# Patient Record
Sex: Female | Born: 1960 | Race: Black or African American | Hispanic: No | Smoking: Never smoker
Health system: Southern US, Community
[De-identification: ages and names within clinical notes are randomized; demographics above are authoritative.]

## PROBLEM LIST (undated history)

## (undated) DIAGNOSIS — M549 Dorsalgia, unspecified: Secondary | ICD-10-CM

## (undated) DIAGNOSIS — I679 Cerebrovascular disease, unspecified: Secondary | ICD-10-CM

## (undated) DIAGNOSIS — K219 Gastro-esophageal reflux disease without esophagitis: Secondary | ICD-10-CM

## (undated) DIAGNOSIS — R269 Unspecified abnormalities of gait and mobility: Secondary | ICD-10-CM

## (undated) DIAGNOSIS — E119 Type 2 diabetes mellitus without complications: Secondary | ICD-10-CM

## (undated) DIAGNOSIS — E785 Hyperlipidemia, unspecified: Secondary | ICD-10-CM

## (undated) DIAGNOSIS — I639 Cerebral infarction, unspecified: Secondary | ICD-10-CM

## (undated) DIAGNOSIS — I6509 Occlusion and stenosis of unspecified vertebral artery: Secondary | ICD-10-CM

## (undated) DIAGNOSIS — I10 Essential (primary) hypertension: Secondary | ICD-10-CM

## (undated) DIAGNOSIS — H532 Diplopia: Secondary | ICD-10-CM

## (undated) DIAGNOSIS — G610 Guillain-Barre syndrome: Secondary | ICD-10-CM

## (undated) DIAGNOSIS — L2089 Other atopic dermatitis: Secondary | ICD-10-CM

## (undated) HISTORY — DX: Essential (primary) hypertension: I10

## (undated) HISTORY — DX: Unspecified abnormalities of gait and mobility: R26.9

## (undated) HISTORY — PX: ABDOMINAL HYSTERECTOMY: SHX81

## (undated) HISTORY — DX: Other atopic dermatitis: L20.89

## (undated) HISTORY — DX: Hyperlipidemia, unspecified: E78.5

## (undated) HISTORY — DX: Cerebrovascular disease, unspecified: I67.9

## (undated) HISTORY — PX: FOOT SURGERY: SHX648

## (undated) HISTORY — DX: Occlusion and stenosis of unspecified vertebral artery: I65.09

## (undated) HISTORY — PX: DILATION AND CURETTAGE OF UTERUS: SHX78

## (undated) HISTORY — DX: Type 2 diabetes mellitus without complications: E11.9

## (undated) HISTORY — DX: Dorsalgia, unspecified: M54.9

## (undated) HISTORY — DX: Diplopia: H53.2

---

## 1986-05-02 DIAGNOSIS — G610 Guillain-Barre syndrome: Secondary | ICD-10-CM

## 1986-05-02 HISTORY — DX: Guillain-Barre syndrome: G61.0

## 1998-05-15 ENCOUNTER — Other Ambulatory Visit: Admission: RE | Admit: 1998-05-15 | Discharge: 1998-05-15 | Payer: Self-pay | Admitting: Obstetrics & Gynecology

## 1998-05-22 ENCOUNTER — Encounter: Payer: Self-pay | Admitting: Obstetrics and Gynecology

## 1998-05-22 ENCOUNTER — Ambulatory Visit (HOSPITAL_COMMUNITY): Admission: RE | Admit: 1998-05-22 | Discharge: 1998-05-22 | Payer: Self-pay | Admitting: *Deleted

## 1998-06-25 ENCOUNTER — Observation Stay (HOSPITAL_COMMUNITY): Admission: AD | Admit: 1998-06-25 | Discharge: 1998-06-26 | Payer: Self-pay | Admitting: Obstetrics and Gynecology

## 1998-07-09 ENCOUNTER — Inpatient Hospital Stay (HOSPITAL_COMMUNITY): Admission: AD | Admit: 1998-07-09 | Discharge: 1998-07-09 | Payer: Self-pay | Admitting: Obstetrics & Gynecology

## 1998-08-07 ENCOUNTER — Inpatient Hospital Stay (HOSPITAL_COMMUNITY): Admission: AD | Admit: 1998-08-07 | Discharge: 1998-08-07 | Payer: Self-pay | Admitting: Obstetrics & Gynecology

## 1998-09-04 ENCOUNTER — Inpatient Hospital Stay (HOSPITAL_COMMUNITY): Admission: AD | Admit: 1998-09-04 | Discharge: 1998-09-04 | Payer: Self-pay | Admitting: Obstetrics & Gynecology

## 1998-10-05 ENCOUNTER — Inpatient Hospital Stay (HOSPITAL_COMMUNITY): Admission: AD | Admit: 1998-10-05 | Discharge: 1998-10-05 | Payer: Self-pay | Admitting: Obstetrics & Gynecology

## 1998-11-09 ENCOUNTER — Inpatient Hospital Stay (HOSPITAL_COMMUNITY): Admission: AD | Admit: 1998-11-09 | Discharge: 1998-11-09 | Payer: Self-pay | Admitting: Obstetrics & Gynecology

## 1998-12-04 ENCOUNTER — Inpatient Hospital Stay (HOSPITAL_COMMUNITY): Admission: AD | Admit: 1998-12-04 | Discharge: 1998-12-04 | Payer: Self-pay | Admitting: Obstetrics & Gynecology

## 2000-08-25 ENCOUNTER — Other Ambulatory Visit: Admission: RE | Admit: 2000-08-25 | Discharge: 2000-08-25 | Payer: Self-pay | Admitting: Internal Medicine

## 2002-04-05 ENCOUNTER — Ambulatory Visit (HOSPITAL_COMMUNITY): Admission: RE | Admit: 2002-04-05 | Discharge: 2002-04-05 | Payer: Self-pay | Admitting: Internal Medicine

## 2002-04-05 ENCOUNTER — Encounter: Payer: Self-pay | Admitting: Internal Medicine

## 2003-05-07 ENCOUNTER — Ambulatory Visit (HOSPITAL_COMMUNITY): Admission: RE | Admit: 2003-05-07 | Discharge: 2003-05-07 | Payer: Self-pay | Admitting: Internal Medicine

## 2004-07-23 ENCOUNTER — Ambulatory Visit (HOSPITAL_COMMUNITY): Admission: RE | Admit: 2004-07-23 | Discharge: 2004-07-23 | Payer: Self-pay | Admitting: Internal Medicine

## 2005-12-24 ENCOUNTER — Emergency Department (HOSPITAL_COMMUNITY): Admission: EM | Admit: 2005-12-24 | Discharge: 2005-12-24 | Payer: Self-pay | Admitting: Emergency Medicine

## 2006-04-05 ENCOUNTER — Ambulatory Visit (HOSPITAL_COMMUNITY): Admission: RE | Admit: 2006-04-05 | Discharge: 2006-04-05 | Payer: Self-pay | Admitting: Internal Medicine

## 2006-04-28 ENCOUNTER — Ambulatory Visit: Payer: Self-pay | Admitting: Internal Medicine

## 2006-04-28 LAB — CONVERTED CEMR LAB
ALT: 17 units/L (ref 0–40)
AST: 22 units/L (ref 0–37)
Albumin: 3.3 g/dL — ABNORMAL LOW (ref 3.5–5.2)
Alkaline Phosphatase: 60 units/L (ref 39–117)
BUN: 10 mg/dL (ref 6–23)
Basophils Absolute: 0.1 10*3/uL (ref 0.0–0.1)
Basophils Relative: 1.6 % — ABNORMAL HIGH (ref 0.0–1.0)
CO2: 31 meq/L (ref 19–32)
Calcium: 9 mg/dL (ref 8.4–10.5)
Chloride: 105 meq/L (ref 96–112)
Chol/HDL Ratio, serum: 5.8
Cholesterol: 221 mg/dL (ref 0–200)
Creatinine, Ser: 0.7 mg/dL (ref 0.4–1.2)
Eosinophil percent: 3 % (ref 0.0–5.0)
GFR calc non Af Amer: 96 mL/min
Glomerular Filtration Rate, Af Am: 116 mL/min/{1.73_m2}
Glucose, Bld: 129 mg/dL — ABNORMAL HIGH (ref 70–99)
HCT: 37 % (ref 36.0–46.0)
HDL: 38.2 mg/dL — ABNORMAL LOW (ref >39.0)
Hemoglobin: 12.5 g/dL (ref 12.0–15.0)
Hgb A1c MFr Bld: 6.6 % — ABNORMAL HIGH (ref 4.6–6.0)
LDL DIRECT: 182.9 mg/dL
Lymphocytes Relative: 31.2 % (ref 12.0–46.0)
MCHC: 33.8 g/dL (ref 30.0–36.0)
MCV: 76.8 fL — ABNORMAL LOW (ref 78.0–100.0)
Monocytes Absolute: 0.8 10*3/uL — ABNORMAL HIGH (ref 0.2–0.7)
Monocytes Relative: 15.4 % — ABNORMAL HIGH (ref 3.0–11.0)
Neutro Abs: 2.5 10*3/uL (ref 1.4–7.7)
Neutrophils Relative %: 48.8 % (ref 43.0–77.0)
Platelets: 284 10*3/uL (ref 150–400)
Potassium: 4.7 meq/L (ref 3.5–5.1)
RBC: 4.81 M/uL (ref 3.87–5.11)
RDW: 14.7 % — ABNORMAL HIGH (ref 11.5–14.6)
Sodium: 141 meq/L (ref 135–145)
TSH: 0.89 microintl units/mL (ref 0.35–5.50)
Total Bilirubin: 0.6 mg/dL (ref 0.3–1.2)
Total Protein: 6.9 g/dL (ref 6.0–8.3)
Triglyceride fasting, serum: 72 mg/dL (ref 0–149)
VLDL: 14 mg/dL (ref 0–40)
WBC: 5.3 10*3/uL (ref 4.5–10.5)

## 2006-05-05 ENCOUNTER — Ambulatory Visit: Payer: Self-pay | Admitting: Internal Medicine

## 2006-07-10 ENCOUNTER — Telehealth: Payer: Self-pay | Admitting: Licensed Clinical Social Worker

## 2006-07-31 ENCOUNTER — Ambulatory Visit: Payer: Self-pay | Admitting: Internal Medicine

## 2006-08-02 ENCOUNTER — Inpatient Hospital Stay (HOSPITAL_COMMUNITY): Admission: RE | Admit: 2006-08-02 | Discharge: 2006-08-04 | Payer: Self-pay | Admitting: Obstetrics and Gynecology

## 2006-08-02 ENCOUNTER — Encounter (INDEPENDENT_AMBULATORY_CARE_PROVIDER_SITE_OTHER): Payer: Self-pay | Admitting: Specialist

## 2006-09-05 ENCOUNTER — Encounter: Admission: RE | Admit: 2006-09-05 | Discharge: 2006-09-05 | Payer: Self-pay | Admitting: Obstetrics and Gynecology

## 2008-05-02 DIAGNOSIS — I639 Cerebral infarction, unspecified: Secondary | ICD-10-CM

## 2008-05-02 HISTORY — DX: Cerebral infarction, unspecified: I63.9

## 2008-07-14 ENCOUNTER — Emergency Department (HOSPITAL_COMMUNITY): Admission: EM | Admit: 2008-07-14 | Discharge: 2008-07-15 | Payer: Self-pay | Admitting: Emergency Medicine

## 2008-07-15 ENCOUNTER — Ambulatory Visit: Payer: Self-pay | Admitting: Internal Medicine

## 2008-07-15 ENCOUNTER — Inpatient Hospital Stay (HOSPITAL_COMMUNITY): Admission: AD | Admit: 2008-07-15 | Discharge: 2008-07-21 | Payer: Self-pay | Admitting: Internal Medicine

## 2008-07-15 DIAGNOSIS — H532 Diplopia: Secondary | ICD-10-CM

## 2008-07-15 DIAGNOSIS — I10 Essential (primary) hypertension: Secondary | ICD-10-CM

## 2008-07-15 DIAGNOSIS — E119 Type 2 diabetes mellitus without complications: Secondary | ICD-10-CM

## 2008-07-15 HISTORY — DX: Essential (primary) hypertension: I10

## 2008-07-15 HISTORY — DX: Diplopia: H53.2

## 2008-07-15 HISTORY — DX: Type 2 diabetes mellitus without complications: E11.9

## 2008-07-15 LAB — CONVERTED CEMR LAB: Blood Glucose, Fingerstick: 244

## 2008-07-16 ENCOUNTER — Encounter: Payer: Self-pay | Admitting: Internal Medicine

## 2008-07-17 ENCOUNTER — Encounter: Payer: Self-pay | Admitting: Internal Medicine

## 2008-07-17 ENCOUNTER — Ambulatory Visit: Payer: Self-pay | Admitting: Physical Medicine & Rehabilitation

## 2008-07-21 ENCOUNTER — Inpatient Hospital Stay (HOSPITAL_COMMUNITY): Admission: RE | Admit: 2008-07-21 | Discharge: 2008-07-25 | Payer: Self-pay | Admitting: Internal Medicine

## 2008-07-23 ENCOUNTER — Ambulatory Visit: Payer: Self-pay | Admitting: Physical Medicine & Rehabilitation

## 2008-07-29 ENCOUNTER — Encounter: Admission: RE | Admit: 2008-07-29 | Discharge: 2008-07-29 | Payer: Self-pay | Admitting: Internal Medicine

## 2008-07-29 ENCOUNTER — Encounter
Admission: RE | Admit: 2008-07-29 | Discharge: 2008-10-27 | Payer: Self-pay | Admitting: Physical Medicine & Rehabilitation

## 2008-07-29 ENCOUNTER — Encounter: Payer: Self-pay | Admitting: Internal Medicine

## 2008-08-05 ENCOUNTER — Ambulatory Visit: Payer: Self-pay | Admitting: Internal Medicine

## 2008-08-05 DIAGNOSIS — I69354 Hemiplegia and hemiparesis following cerebral infarction affecting left non-dominant side: Secondary | ICD-10-CM | POA: Insufficient documentation

## 2008-08-05 DIAGNOSIS — I679 Cerebrovascular disease, unspecified: Secondary | ICD-10-CM

## 2008-08-05 HISTORY — DX: Cerebrovascular disease, unspecified: I67.9

## 2008-08-06 ENCOUNTER — Telehealth: Payer: Self-pay | Admitting: Internal Medicine

## 2008-08-12 ENCOUNTER — Encounter: Payer: Self-pay | Admitting: Internal Medicine

## 2008-08-22 ENCOUNTER — Encounter
Admission: RE | Admit: 2008-08-22 | Discharge: 2008-11-20 | Payer: Self-pay | Admitting: Physical Medicine & Rehabilitation

## 2008-08-25 ENCOUNTER — Telehealth: Payer: Self-pay | Admitting: Internal Medicine

## 2008-08-26 ENCOUNTER — Ambulatory Visit: Payer: Self-pay | Admitting: Physical Medicine & Rehabilitation

## 2008-09-02 ENCOUNTER — Ambulatory Visit: Payer: Self-pay | Admitting: Internal Medicine

## 2008-09-15 ENCOUNTER — Telehealth: Payer: Self-pay | Admitting: Internal Medicine

## 2008-10-06 ENCOUNTER — Ambulatory Visit (HOSPITAL_COMMUNITY): Admission: RE | Admit: 2008-10-06 | Discharge: 2008-10-06 | Payer: Self-pay | Admitting: Internal Medicine

## 2008-10-21 ENCOUNTER — Ambulatory Visit: Payer: Self-pay | Admitting: Physical Medicine & Rehabilitation

## 2008-10-23 ENCOUNTER — Telehealth: Payer: Self-pay | Admitting: Internal Medicine

## 2008-11-05 ENCOUNTER — Ambulatory Visit: Payer: Self-pay | Admitting: Internal Medicine

## 2008-11-05 LAB — CONVERTED CEMR LAB: Blood Glucose, Fingerstick: 226

## 2008-11-14 ENCOUNTER — Ambulatory Visit: Payer: Self-pay | Admitting: Family Medicine

## 2008-11-14 DIAGNOSIS — M549 Dorsalgia, unspecified: Secondary | ICD-10-CM | POA: Insufficient documentation

## 2008-11-14 HISTORY — DX: Dorsalgia, unspecified: M54.9

## 2008-12-08 ENCOUNTER — Telehealth (INDEPENDENT_AMBULATORY_CARE_PROVIDER_SITE_OTHER): Payer: Self-pay | Admitting: *Deleted

## 2008-12-15 ENCOUNTER — Encounter
Admission: RE | Admit: 2008-12-15 | Discharge: 2008-12-15 | Payer: Self-pay | Admitting: Physical Medicine & Rehabilitation

## 2008-12-19 ENCOUNTER — Ambulatory Visit: Payer: Self-pay | Admitting: Internal Medicine

## 2008-12-19 LAB — CONVERTED CEMR LAB: Hgb A1c MFr Bld: 6.4 % — ABNORMAL HIGH (ref 4.6–6.1)

## 2009-02-27 ENCOUNTER — Ambulatory Visit: Payer: Self-pay | Admitting: Internal Medicine

## 2009-02-27 LAB — CONVERTED CEMR LAB
ALT: 20 units/L (ref 0–35)
AST: 19 units/L (ref 0–37)
Albumin: 3.7 g/dL (ref 3.5–5.2)
Alkaline Phosphatase: 64 units/L (ref 39–117)
BUN: 11 mg/dL (ref 6–23)
Basophils Absolute: 0 10*3/uL (ref 0.0–0.1)
Basophils Relative: 0.7 % (ref 0.0–3.0)
Bilirubin, Direct: 0 mg/dL (ref 0.0–0.3)
CO2: 29 meq/L (ref 19–32)
Calcium: 8.9 mg/dL (ref 8.4–10.5)
Chloride: 105 meq/L (ref 96–112)
Cholesterol: 239 mg/dL — ABNORMAL HIGH (ref 0–200)
Creatinine, Ser: 0.7 mg/dL (ref 0.4–1.2)
Creatinine,U: 176.9 mg/dL
Direct LDL: 197.6 mg/dL
Eosinophils Absolute: 0.1 10*3/uL (ref 0.0–0.7)
Eosinophils Relative: 2.5 % (ref 0.0–5.0)
GFR calc non Af Amer: 114.01 mL/min (ref >60)
Glucose, Bld: 126 mg/dL — ABNORMAL HIGH (ref 70–99)
HCT: 38.5 % (ref 36.0–46.0)
HDL: 38.4 mg/dL — ABNORMAL LOW (ref >39.00)
Hemoglobin: 13 g/dL (ref 12.0–15.0)
Hgb A1c MFr Bld: 6.8 % — ABNORMAL HIGH (ref 4.6–6.5)
Lymphocytes Relative: 40.3 % (ref 12.0–46.0)
Lymphs Abs: 2.2 10*3/uL (ref 0.7–4.0)
MCHC: 33.9 g/dL (ref 30.0–36.0)
MCV: 83.6 fL (ref 78.0–100.0)
Microalb Creat Ratio: 8.5 mg/g (ref 0.0–30.0)
Microalb, Ur: 1.5 mg/dL (ref 0.0–1.9)
Monocytes Absolute: 0.6 10*3/uL (ref 0.1–1.0)
Monocytes Relative: 10.8 % (ref 3.0–12.0)
Neutro Abs: 2.5 10*3/uL (ref 1.4–7.7)
Neutrophils Relative %: 45.7 % (ref 43.0–77.0)
Platelets: 226 10*3/uL (ref 150.0–400.0)
Potassium: 3.5 meq/L (ref 3.5–5.1)
RBC: 4.6 M/uL (ref 3.87–5.11)
RDW: 13.4 % (ref 11.5–14.6)
Sodium: 143 meq/L (ref 135–145)
TSH: 0.62 microintl units/mL (ref 0.35–5.50)
Total Bilirubin: 0.6 mg/dL (ref 0.3–1.2)
Total CHOL/HDL Ratio: 6
Total Protein: 8 g/dL (ref 6.0–8.3)
Triglycerides: 66 mg/dL (ref 0.0–149.0)
VLDL: 13.2 mg/dL (ref 0.0–40.0)
WBC: 5.4 10*3/uL (ref 4.5–10.5)

## 2009-03-06 ENCOUNTER — Telehealth: Payer: Self-pay | Admitting: Internal Medicine

## 2009-03-06 ENCOUNTER — Ambulatory Visit: Payer: Self-pay | Admitting: Internal Medicine

## 2009-03-06 DIAGNOSIS — E785 Hyperlipidemia, unspecified: Secondary | ICD-10-CM

## 2009-03-06 DIAGNOSIS — E1169 Type 2 diabetes mellitus with other specified complication: Secondary | ICD-10-CM

## 2009-03-06 HISTORY — DX: Hyperlipidemia, unspecified: E78.5

## 2009-03-13 ENCOUNTER — Telehealth (INDEPENDENT_AMBULATORY_CARE_PROVIDER_SITE_OTHER): Payer: Self-pay | Admitting: *Deleted

## 2009-03-20 ENCOUNTER — Telehealth: Payer: Self-pay | Admitting: Internal Medicine

## 2009-04-03 ENCOUNTER — Ambulatory Visit: Payer: Self-pay | Admitting: Internal Medicine

## 2009-04-03 DIAGNOSIS — L2089 Other atopic dermatitis: Secondary | ICD-10-CM

## 2009-04-03 HISTORY — DX: Other atopic dermatitis: L20.89

## 2009-04-03 LAB — CONVERTED CEMR LAB: Blood Glucose, Fingerstick: 183

## 2009-12-02 ENCOUNTER — Encounter (INDEPENDENT_AMBULATORY_CARE_PROVIDER_SITE_OTHER): Payer: Self-pay | Admitting: *Deleted

## 2010-01-27 ENCOUNTER — Encounter (INDEPENDENT_AMBULATORY_CARE_PROVIDER_SITE_OTHER): Payer: Self-pay | Admitting: *Deleted

## 2010-01-29 ENCOUNTER — Ambulatory Visit (HOSPITAL_COMMUNITY): Admission: RE | Admit: 2010-01-29 | Discharge: 2010-01-29 | Payer: Self-pay | Admitting: Internal Medicine

## 2010-02-01 ENCOUNTER — Encounter (INDEPENDENT_AMBULATORY_CARE_PROVIDER_SITE_OTHER): Payer: Self-pay | Admitting: *Deleted

## 2010-02-01 ENCOUNTER — Ambulatory Visit: Payer: Self-pay | Admitting: Gastroenterology

## 2010-02-01 ENCOUNTER — Telehealth: Payer: Self-pay | Admitting: Internal Medicine

## 2010-03-05 ENCOUNTER — Ambulatory Visit: Payer: Self-pay | Admitting: Gastroenterology

## 2010-03-09 ENCOUNTER — Telehealth: Payer: Self-pay | Admitting: Internal Medicine

## 2010-03-12 ENCOUNTER — Telehealth: Payer: Self-pay

## 2010-03-12 ENCOUNTER — Ambulatory Visit: Payer: Self-pay | Admitting: Internal Medicine

## 2010-03-12 LAB — CONVERTED CEMR LAB
ALT: 23 units/L (ref 0–35)
Alkaline Phosphatase: 79 units/L (ref 39–117)
Basophils Relative: 0.4 % (ref 0.0–3.0)
Bilirubin Urine: NEGATIVE
Bilirubin, Direct: 0.1 mg/dL (ref 0.0–0.3)
Blood in Urine, dipstick: NEGATIVE
Chloride: 101 meq/L (ref 96–112)
Cholesterol: 276 mg/dL — ABNORMAL HIGH (ref 0–200)
Creatinine, Ser: 0.7 mg/dL (ref 0.4–1.2)
Direct LDL: 218.7 mg/dL
Eosinophils Relative: 2.3 % (ref 0.0–5.0)
Hemoglobin: 13.5 g/dL (ref 12.0–15.0)
Hgb A1c MFr Bld: 9.9 % — ABNORMAL HIGH (ref 4.6–6.5)
Ketones, urine, test strip: NEGATIVE
MCV: 79.6 fL (ref 78.0–100.0)
Monocytes Absolute: 0.6 10*3/uL (ref 0.1–1.0)
Neutrophils Relative %: 45.8 % (ref 43.0–77.0)
Nitrite: NEGATIVE
Potassium: 4.8 meq/L (ref 3.5–5.1)
Protein, U semiquant: NEGATIVE
RBC: 5.03 M/uL (ref 3.87–5.11)
Sodium: 137 meq/L (ref 135–145)
Specific Gravity, Urine: 1.02
Total CHOL/HDL Ratio: 6
Total Protein: 7.3 g/dL (ref 6.0–8.3)
Urobilinogen, UA: 0.2
VLDL: 22.6 mg/dL (ref 0.0–40.0)
WBC Urine, dipstick: NEGATIVE
WBC: 5 10*3/uL (ref 4.5–10.5)
pH: 6.5

## 2010-03-15 ENCOUNTER — Encounter: Payer: Self-pay | Admitting: Internal Medicine

## 2010-03-16 ENCOUNTER — Telehealth: Payer: Self-pay | Admitting: Internal Medicine

## 2010-03-19 ENCOUNTER — Encounter: Payer: Self-pay | Admitting: Internal Medicine

## 2010-03-19 ENCOUNTER — Ambulatory Visit: Payer: Self-pay | Admitting: Internal Medicine

## 2010-03-19 LAB — CONVERTED CEMR LAB: Blood Glucose, Fingerstick: 366

## 2010-05-01 ENCOUNTER — Emergency Department (HOSPITAL_COMMUNITY)
Admission: EM | Admit: 2010-05-01 | Discharge: 2010-05-01 | Payer: Self-pay | Source: Home / Self Care | Admitting: Emergency Medicine

## 2010-05-07 ENCOUNTER — Ambulatory Visit
Admission: RE | Admit: 2010-05-07 | Discharge: 2010-05-07 | Payer: Self-pay | Source: Home / Self Care | Attending: Internal Medicine | Admitting: Internal Medicine

## 2010-05-07 ENCOUNTER — Other Ambulatory Visit: Payer: Self-pay | Admitting: Internal Medicine

## 2010-05-07 LAB — HEMOGLOBIN A1C: Hgb A1c MFr Bld: 9.3 % — ABNORMAL HIGH (ref 4.6–6.5)

## 2010-05-10 ENCOUNTER — Inpatient Hospital Stay (HOSPITAL_COMMUNITY)
Admission: AD | Admit: 2010-05-10 | Discharge: 2010-05-12 | Payer: Self-pay | Source: Home / Self Care | Attending: Internal Medicine | Admitting: Internal Medicine

## 2010-05-10 ENCOUNTER — Encounter (INDEPENDENT_AMBULATORY_CARE_PROVIDER_SITE_OTHER): Payer: Self-pay | Admitting: Internal Medicine

## 2010-05-10 ENCOUNTER — Ambulatory Visit
Admission: RE | Admit: 2010-05-10 | Discharge: 2010-05-10 | Payer: Self-pay | Source: Home / Self Care | Attending: Internal Medicine | Admitting: Internal Medicine

## 2010-05-10 DIAGNOSIS — R269 Unspecified abnormalities of gait and mobility: Secondary | ICD-10-CM | POA: Insufficient documentation

## 2010-05-10 HISTORY — DX: Unspecified abnormalities of gait and mobility: R26.9

## 2010-05-10 LAB — CONVERTED CEMR LAB: Blood Glucose, Fingerstick: 159

## 2010-05-11 ENCOUNTER — Encounter: Payer: Self-pay | Admitting: Internal Medicine

## 2010-05-11 ENCOUNTER — Encounter (INDEPENDENT_AMBULATORY_CARE_PROVIDER_SITE_OTHER): Payer: Self-pay | Admitting: Internal Medicine

## 2010-05-13 ENCOUNTER — Telehealth: Payer: Self-pay

## 2010-05-17 LAB — COMPREHENSIVE METABOLIC PANEL
ALT: 14 U/L (ref 0–35)
AST: 17 U/L (ref 0–37)
Albumin: 3.3 g/dL — ABNORMAL LOW (ref 3.5–5.2)
Alkaline Phosphatase: 69 U/L (ref 39–117)
BUN: 18 mg/dL (ref 6–23)
CO2: 27 mEq/L (ref 19–32)
Calcium: 9.8 mg/dL (ref 8.4–10.5)
Chloride: 101 mEq/L (ref 96–112)
Creatinine, Ser: 0.97 mg/dL (ref 0.4–1.2)
GFR calc Af Amer: >60 mL/min (ref >60)
GFR calc non Af Amer: >60 mL/min (ref >60)
Glucose, Bld: 97 mg/dL (ref 70–99)
Potassium: 3.7 mEq/L (ref 3.5–5.1)
Sodium: 138 mEq/L (ref 135–145)
Total Bilirubin: 0.4 mg/dL (ref 0.3–1.2)
Total Protein: 7.5 g/dL (ref 6.0–8.3)

## 2010-05-17 LAB — CARDIAC PANEL(CRET KIN+CKTOT+MB+TROPI)
CK, MB: 1.3 ng/mL (ref 0.3–4.0)
Relative Index: 1.3 (ref 0.0–2.5)
Total CK: 104 U/L (ref 7–177)
Troponin I: 0.02 ng/mL (ref 0.00–0.06)

## 2010-05-17 LAB — GLUCOSE, CAPILLARY
Glucose-Capillary: 73 mg/dL (ref 70–99)
Glucose-Capillary: 128 mg/dL — ABNORMAL HIGH (ref 70–99)
Glucose-Capillary: 117 mg/dL — ABNORMAL HIGH (ref 70–99)
Glucose-Capillary: 134 mg/dL — ABNORMAL HIGH (ref 70–99)
Glucose-Capillary: 194 mg/dL — ABNORMAL HIGH (ref 70–99)
Glucose-Capillary: 145 mg/dL — ABNORMAL HIGH (ref 70–99)
Glucose-Capillary: 132 mg/dL — ABNORMAL HIGH (ref 70–99)

## 2010-05-17 LAB — APTT: aPTT: 31 seconds (ref 24–37)

## 2010-05-17 LAB — LIPID PANEL
Cholesterol: 204 mg/dL — ABNORMAL HIGH (ref 0–200)
HDL: 36 mg/dL — ABNORMAL LOW (ref >39)
LDL Cholesterol: 148 mg/dL — ABNORMAL HIGH (ref 0–99)
Total CHOL/HDL Ratio: 5.7 RATIO
Triglycerides: 100 mg/dL (ref <150)
VLDL: 20 mg/dL (ref 0–40)

## 2010-05-17 LAB — PROTIME-INR
INR: 0.96 (ref 0.00–1.49)
Prothrombin Time: 13 seconds (ref 11.6–15.2)

## 2010-05-17 LAB — CBC
HCT: 37.4 % (ref 36.0–46.0)
Hemoglobin: 12 g/dL (ref 12.0–15.0)
MCH: 25.1 pg — ABNORMAL LOW (ref 26.0–34.0)
MCHC: 32.1 g/dL (ref 30.0–36.0)
MCV: 78.1 fL (ref 78.0–100.0)
Platelets: 266 10*3/uL (ref 150–400)
RBC: 4.79 MIL/uL (ref 3.87–5.11)
RDW: 15.4 % (ref 11.5–15.5)
WBC: 6 10*3/uL (ref 4.0–10.5)

## 2010-05-17 LAB — HEMOGLOBIN A1C
Hgb A1c MFr Bld: 9.5 % — ABNORMAL HIGH (ref <5.7)
Mean Plasma Glucose: 226 mg/dL — ABNORMAL HIGH (ref <117)

## 2010-05-19 ENCOUNTER — Encounter
Admission: RE | Admit: 2010-05-19 | Discharge: 2010-06-01 | Payer: Self-pay | Source: Home / Self Care | Attending: Internal Medicine | Admitting: Internal Medicine

## 2010-05-24 ENCOUNTER — Ambulatory Visit
Admission: RE | Admit: 2010-05-24 | Discharge: 2010-05-24 | Payer: Self-pay | Source: Home / Self Care | Attending: Internal Medicine | Admitting: Internal Medicine

## 2010-05-24 ENCOUNTER — Telehealth: Payer: Self-pay | Admitting: Internal Medicine

## 2010-05-24 ENCOUNTER — Encounter: Payer: Self-pay | Admitting: Internal Medicine

## 2010-06-01 NOTE — Progress Notes (Signed)
Summary: [facility] called needing clarification Triamcinolone  Phone Note From Pharmacy Call back at [facility] [phone]   ref# [id number]   Caller: [facility] Summary of Call: Needs clarification on the TRIAMCINOLONE ACETONIDE 0.1 % CREAM. How many tubes does the doctor want dispensed for 90 day supply? Pls call [facility] [phone] ref # [id number] Initial call taken by: Lucy Antigua,  March 12, 2010 1:59 PM  Follow-up for Phone Call        spoke with pharmacy - gave ok for 3 tubes to be disp - for 90days. KIK Follow-up by: Duard Brady LPN,  March 12, 2010 2:09 PM

## 2010-06-01 NOTE — Letter (Signed)
Summary: Previsit letter  [facility]  [address] [place], [place] [address]   Phone: [phone]  Fax: [phone]       12/02/2009 MRN: [id number]  [facility] [address] [place], [place]  [address]  Dear Ms. Goodroe,  Welcome to the Gastroenterology Division at [facility].    You are scheduled to see a nurse for your pre-procedure visit on 02/01/2010 at 4:30PM on the 3rd floor at [facility], [address]  We ask that you try to arrive at our office 15 minutes prior to your appointment time to allow for check-in.  Your nurse visit will consist of discussing your medical and surgical history, your immediate family medical history, and your medications.    Please bring a complete list of all your medications or, if you prefer, bring the medication bottles and we will list them.  We will need to be aware of both prescribed and over the counter drugs.  We will need to know exact dosage information as well.  If you are on blood thinners (Coumadin, Plavix, Aggrenox, Ticlid, etc.) please call our office today/prior to your appointment, as we need to consult with your physician about holding your medication.   Please be prepared to read and sign documents such as consent forms, a financial agreement, and acknowledgement forms.  If necessary, and with your consent, a friend or relative is welcome to sit-in on the nurse visit with you.  Please bring your insurance card so that we may make a copy of it.  If your insurance requires a referral to see a specialist, please bring your referral form from your primary care physician.  No co-pay is required for this nurse visit.     If you cannot keep your appointment, please call [phone] to cancel or reschedule prior to your appointment date.  This allows [place] the opportunity to schedule an appointment for another patient in need of care.    Thank you for choosing [facility] for your  medical needs.  We appreciate the opportunity to care for you.  Please visit [place] at our website  to learn more about our practice.                     Sincerely.                                                                                                                   The Gastroenterology Division

## 2010-06-01 NOTE — Medication Information (Signed)
Summary: Drug Interaction Alert  Drug Interaction Alert   Imported By: Maryln Gottron 03/18/2010 09:37:37  _____________________________________________________________________  External Attachment:    Type:   Image     Comment:   External Document

## 2010-06-01 NOTE — Progress Notes (Signed)
Summary: [employer] LIST OF MEDS  Phone Note Call from Patient Call back at Home Phone [phone] Call back at [phone]   Caller: Patient Call For: Gordy Savers  MD Summary of Call: PT WOULD LIKE A LIST OF HER MEDS CALL INTO [employer] FOR MAILORDER [phone]. PT WILL Cleotis Lema WITH FAX # [phone] pt W[place]#[phone] Initial call taken by: Heron Sabins,  February 01, 2010 5:02 PM    Prescriptions: TRIAMCINOLONE ACETONIDE 0.1 % CREA (TRIAMCINOLONE ACETONIDE) used twice daily as needed to control for rash  #60 gm x 1   Entered by:   Duard Brady LPN   Authorized by:   Gordy Savers  MD   Signed by:   Duard Brady LPN on 85/46/2703   Method used:   Faxed to ...       [facility] (mail-order)             , [place]         Ph: [phone]       Fax: [phone]   RxID:   [id number] SIMVASTATIN 80 MG TABS (SIMVASTATIN) one daily  #90 x 1   Entered by:   Duard Brady LPN   Authorized by:   Gordy Savers  MD   Signed by:   Duard Brady LPN on 82/42/3536   Method used:   Faxed to ...       [facility] (mail-order)             , [place]         Ph: [phone]       Fax: [phone]   RxID:   [id number] CYCLOBENZAPRINE HCL 5 MG TABS (CYCLOBENZAPRINE HCL) one twice daily as needed for back pain  #50 x 0   Entered by:   Duard Brady LPN   Authorized by:   Gordy Savers  MD   Signed by:   Duard Brady LPN on 05/39/7673   Method used:   Faxed to ...       [facility] (mail-order)             , [place]         Ph: [phone]       Fax: [phone]   RxID:   [id number] FUROSEMIDE 20 MG TABS (FUROSEMIDE) take one every morning as needed swelling  #50 Each x 3   Entered by:   Duard Brady LPN   Authorized by:   Gordy Savers  MD   Signed by:   Duard Brady LPN on 21/19/4174   Method used:   Faxed to ...       [facility] (mail-order)             , [place]         Ph: [phone]       Fax: [phone]   RxID:    [id number] METFORMIN HCL 1000 MG TABS (METFORMIN HCL) one twice daily  #180 x 1   Entered by:   Duard Brady LPN   Authorized by:   Gordy Savers  MD   Signed by:   Duard Brady LPN on 76/72/0947   Method used:   Faxed to ...       [facility] (mail-order)             , [place]         Ph: [phone]       Fax: [phone]   RxID:   [id number] GLIMEPIRIDE 4 MG TABS (  GLIMEPIRIDE) one every morning  #90 x 1   Entered by:   Duard Brady LPN   Authorized by:   Gordy Savers  MD   Signed by:   Duard Brady LPN on 16/01/9603   Method used:   Faxed to ...       [facility] (mail-order)             , [place]         Ph: [phone]       Fax: [phone]   RxID:   [id number] LISINOPRIL 20 MG TABS (LISINOPRIL) one by mouth daily  #90 x 1   Entered by:   Duard Brady LPN   Authorized by:   Gordy Savers  MD   Signed by:   Duard Brady LPN on 32/44/0102   Method used:   Faxed to ...       [facility] (mail-order)             , [place]         Ph: [phone]       Fax: [phone]   RxID:   [id number] ACTOS 15 MG TABS (PIOGLITAZONE HCL) one by mouth daily  #90 x 1   Entered by:   Duard Brady LPN   Authorized by:   Gordy Savers  MD   Signed by:   Duard Brady LPN on 10/30/1599   Method used:   Faxed to ...       [facility] (mail-order)             , [place]         Ph: [phone]       Fax: [phone]   RxID:   [id number] PANTOPRAZOLE SODIUM 40 MG TBEC (PANTOPRAZOLE SODIUM) one daily  #90 x 1   Entered by:   Duard Brady LPN   Authorized by:   Gordy Savers  MD   Signed by:   Duard Brady LPN on 71/09/2692   Method used:   Faxed to ...       [facility] (mail-order)             , [place]         Ph: [phone]       Fax: [phone]   RxID:   [id number] CHLORTHALIDONE 25 MG TABS (CHLORTHALIDONE) one daily  #90 x 1   Entered by:   Duard Brady LPN   Authorized by:   Gordy Savers  MD   Signed by:   Duard Brady LPN on 02/58/5277   Method used:   Faxed to ...       [facility] (mail-order)             , [place]         Ph: [phone]       Fax: [phone]   RxID:   [id number] NORVASC 10 MG TABS (AMLODIPINE BESYLATE) take one tab once daily  #90 x 1   Entered by:   Duard Brady LPN   Authorized by:   Gordy Savers  MD   Signed by:   Duard Brady LPN on 99/83/3825   Method used:   Faxed to ...       [facility] (mail-order)             , [place]         Ph: [phone]       Fax: [phone]   RxID:   [id number]  pt will  need return visit soon. last seen 04/2009   [facility]

## 2010-06-01 NOTE — Progress Notes (Signed)
Summary: change simvastation to lipitor  Phone Note Other Incoming   [facility] Summary of Call: rec'd fax r/t drug interaction simvastatin80mg  and amlodipine - per Dr. Amador Cunas - dc simvastatin, start liptor 40mg  once daily , continue amlodipine as ordered, also gave rx ok for glimepirdine.  order# [id number]  [phone] Initial call taken by: Duard Brady LPN,  March 16, 2010 2:31 PM  Follow-up for Phone Call        attempt to call pt about changes in med r/t drug interactions - and that she needs to be seen dec or jan to f/u. ans mach - LMTCB to make appt or any questions kik Follow-up by: Duard Brady LPN,  March 16, 2010 2:35 PM    New/Updated Medications: LIPITOR 40 MG TABS (ATORVASTATIN CALCIUM) qd Prescriptions: GLIMEPIRIDE 4 MG TABS (GLIMEPIRIDE) one every morning  #90 x 3   Entered by:   Duard Brady LPN   Authorized by:   Gordy Savers  MD   Signed by:   Duard Brady LPN on 33/29/5188   Method used:   Historical   RxID:   [id number] NORVASC 10 MG TABS (AMLODIPINE BESYLATE) take one tab once daily  #90 x 3   Entered by:   Duard Brady LPN   Authorized by:   Gordy Savers  MD   Signed by:   Duard Brady LPN on 35/57/3220   Method used:   Historical   RxID:   [id number] LIPITOR 40 MG TABS (ATORVASTATIN CALCIUM) qd  #90 x 3   Entered by:   Duard Brady LPN   Authorized by:   Gordy Savers  MD   Signed by:   Duard Brady LPN on 17/61/6073   Method used:   Historical   RxID:   [id number]

## 2010-06-01 NOTE — Letter (Signed)
Summary: Moviprep Instructions  [facility]  [address]   [place], [place] [address]   Phone: [phone]  Fax: [phone]       [name]    1961/02/28    MRN: [id number]        Procedure Day Dorna Bloom: Friday, 03-05-10     Arrival Time: 1:00 p.m.      Procedure Time: 2:00 p.m.     Location of Procedure:                    x    [facility] Center (4th Floor)                        PREPARATION FOR COLONOSCOPY WITH MOVIPREP   Starting 5 days prior to your procedure 02-28-10 do not eat nuts, seeds, popcorn, corn, beans, peas,  salads, or any raw vegetables.  Do not take any fiber supplements (e.g. Metamucil, Citrucel, and Benefiber).  THE DAY BEFORE YOUR PROCEDURE         DATE: 03-04-10  DAY: Thursday  1.  Drink clear liquids the entire day-NO SOLID FOOD  2.  Do not drink anything colored red or purple.  Avoid juices with pulp.  No orange juice.  3.  Drink at least 64 oz. (8 glasses) of fluid/clear liquids during the day to prevent dehydration and help the prep work efficiently.  CLEAR LIQUIDS INCLUDE: Water Jello Ice Popsicles Tea (sugar ok, no milk/cream) Powdered fruit flavored drinks Coffee (sugar ok, no milk/cream) Gatorade Juice: apple, white grape, white cranberry  Lemonade Clear bullion, consomm, broth Carbonated beverages (any kind) Strained chicken noodle soup Hard Candy                             4.  In the morning, mix first dose of MoviPrep solution:    Empty 1 Pouch A and 1 Pouch B into the disposable container    Add lukewarm drinking water to the top line of the container. Mix to dissolve    Refrigerate (mixed solution should be used within 24 hrs)  5.  Begin drinking the prep at 5:00 p.m. The MoviPrep container is divided by 4 marks.   Every 15 minutes drink the solution down to the next mark (approximately 8 oz) until the full liter is complete.   6.  Follow completed prep with 16 oz of clear liquid of your choice  (Nothing red or purple).  Continue to drink clear liquids until bedtime.  7.  Before going to bed, mix second dose of MoviPrep solution:    Empty 1 Pouch A and 1 Pouch B into the disposable container    Add lukewarm drinking water to the top line of the container. Mix to dissolve    Refrigerate  THE DAY OF YOUR PROCEDURE      DATE: 03-05-10  DAY: Friday  Beginning at 9:00 a.m. (5 hours before procedure):         1. Every 15 minutes, drink the solution down to the next mark (approx 8 oz) until the full liter is complete.  2. Follow completed prep with 16 oz. of clear liquid of your choice.    3. You may drink clear liquids until 12:00 p.m. (2 HOURS BEFORE PROCEDURE).   MEDICATION INSTRUCTIONS  Unless otherwise instructed, you should take regular prescription medications with a small sip of water   as early as possible the  morning of your procedure.  Diabetic patients - see separate instructions.   Additional medication instructions: Do not take Chlorthalidone day of procedure.         OTHER INSTRUCTIONS  You will need a responsible adult at least 51 years of age to accompany you and drive you home.   This person must remain in the waiting room during your procedure.  Wear loose fitting clothing that is easily removed.  Leave jewelry and other valuables at home.  However, you may wish to bring a book to read or  an iPod/MP3 player to listen to music as you wait for your procedure to start.  Remove all body piercing jewelry and leave at home.  Total time from sign-in until discharge is approximately 2-3 hours.  You should go home directly after your procedure and rest.  You can resume normal activities the  day after your procedure.  The day of your procedure you should not:   Drive   Make legal decisions   Operate machinery   Drink alcohol   Return to work  You will receive specific instructions about eating, activities and medications before you  leave.    The above instructions have been reviewed and explained to me by   Ezra Sites RN  February 01, 2010 4:39 PM     I fully understand and can verbalize these instructions _____________________________ Date _________

## 2010-06-01 NOTE — Assessment & Plan Note (Signed)
Summary: cpx no pap//ccm FLU SHOT/NJR rsc bmp/njr   Vital Signs:  Patient profile:   50 year old female Height:      64 inches Weight:      213 pounds BMI:     36.69 Temp:     98.1 degrees F oral BP sitting:   170 / 100  (right arm) Cuff size:   regular  Vitals Entered By: Duard Brady LPN (March 19, 2010 1:11 PM) CC: cpx - doing ok , just got layed off from work  Is Patient Diabetic? Yes Did you bring your meter with you today? No CBG Result 366   CC:  cpx - doing ok  and just got layed off from work .  History of Present Illness: 50 year old patient who is seen today for a health maintenance examination.  She has a history of type 2 diabetes as well as hypertension.  Unfortunately, she has not been compliant with her medication, primarily due to cost concerns.  Random blood sugar today 366.  Initial blood pressure 170/100.  Frontal and she has insurance but was laid off yesterday.  She has a history of cerebrovascular disease.  She checks blood sugars infrequently due to the cost of the test strips.  She has had a recent colonoscopy and mammogram.  Her last eye exam was in February of this year. Laboratory studies were reviewed, which revealed a hemoglobin A1c of 9.9, and a total cholesterol of 276  Preventive Screening-Counseling & Management  Caffeine-Diet-Exercise     Does Patient Exercise: yes  Allergies (verified): No Known Drug Allergies  Past History:  Past Medical History: Reviewed history from 03/06/2009 and no changes required. 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 father died age 31, stomach cancer mother died at 41, complications of hypertension, and cerebrovascular disease  One brother, history of asthma two sisters positive for hypertension, diabetes, cardiopathy hepatitis, and thyroid disease  Social History: Reviewed history from 07/15/2008 and no changes required. Divorced Regular exercise-yes Does Patient Exercise:  yes  Review of Systems  The patient denies anorexia, fever, weight loss, weight gain, vision loss, decreased hearing, hoarseness, chest pain, syncope, dyspnea on exertion, peripheral edema, prolonged cough, headaches, hemoptysis, abdominal pain, melena, severe indigestion/heartburn, hematuria, incontinence, genital sores, muscle weakness, suspicious skin lesions, transient blindness, difficulty walking, depression, unusual weight change, abnormal bleeding, enlarged lymph nodes, angioedema, and breast masses.    Physical Exam  General:  overweight-appearing.  160/96 Head:  Normocephalic and atraumatic without obvious abnormalities. No apparent alopecia or balding. Eyes:  No corneal or conjunctival inflammation noted. EOMI. Perrla. Funduscopic exam benign, without hemorrhages, exudates or papilledema. Vision grossly normal. Ears:  External ear exam shows no significant lesions or deformities.  Otoscopic examination reveals clear canals, tympanic membranes are intact bilaterally without bulging, retraction, inflammation or discharge. Hearing is grossly normal bilaterally. Nose:  External nasal examination shows no deformity or inflammation. Nasal mucosa are pink and moist without lesions or exudates. Mouth:  Oral mucosa and oropharynx without lesions or exudates.  Teeth in good repair. Neck:  No deformities, masses, or tenderness noted. Chest Wall:  No deformities, masses, or tenderness noted. Breasts:  No  mass, nodules, thickening, tenderness, bulging, retraction, inflamation, nipple discharge or  skin changes noted.   Lungs:  Normal respiratory effort, chest expands symmetrically. Lungs are clear to auscultation, no crackles or wheezes. Heart:  Normal rate and regular rhythm. S1 and S2 normal without gallop, murmur, click, rub or other extra sounds. Abdomen:  Bowel sounds positive,abdomen soft and non-tender without masses, organomegaly or hernias noted. Rectal:  deferred due to recent colonoscopy Genitalia:  declined;  status post hysterectomy Msk:  No deformity or scoliosis noted of thoracic or lumbar spine.   Pulses:  R and L carotid,radial,femoral,dorsalis pedis and posterior tibial pulses are full and equal bilaterally Extremities:  No clubbing, cyanosis, edema, or deformity noted with normal full range of motion of all joints.   Neurologic:  No cranial nerve deficits noted. Station and gait are normal. Plantar reflexes are down-going bilaterally. DTRs are symmetrical throughout. Sensory, motor and coordinative functions appear intact. Skin:  Intact without suspicious lesions or rashes Cervical Nodes:  No lymphadenopathy noted Axillary Nodes:  No palpable lymphadenopathy Inguinal Nodes:  No significant adenopathy Psych:  Cognition and judgment appear intact. Alert and cooperative with normal attention span and concentration. No apparent delusions, illusions, hallucinations  Diabetes Management Exam:    Foot Exam (with socks and/or shoes not present):       Sensory-Pinprick/Light touch:          Left medial foot (L-4): normal          Left dorsal foot (L-5): normal          Left lateral foot (S-1): normal          Right medial foot (L-4): normal          Right dorsal foot (L-5): normal          Right lateral foot (S-1): normal       Sensory-Monofilament:          Left foot: normal          Right foot: normal       Inspection:          Left foot: normal          Right foot: normal       Nails:          Left foot: normal          Right foot: normal    Foot Exam by  Podiatrist:       Date: 03/19/2010       Results: no diabetic findings       Done by: PCP    Eye Exam:       Eye Exam done here today          Results: normal   Impression & Recommendations:  Problem # 1:  PREVENTIVE HEALTH CARE (ICD-V70.0)  Complete Medication List: 1)  Norvasc 10 Mg Tabs (Amlodipine besylate) .... Take one tab once daily 2)  Sb Aspirin 325 Mg Tabs (Aspirin) .... Once daily 3)  Chlorthalidone 25 Mg Tabs (Chlorthalidone) .... One daily 4)  Pantoprazole Sodium 40 Mg Tbec (Pantoprazole sodium) .... One daily 5)  Actos 15 Mg Tabs (Pioglitazone hcl) .... One by mouth daily 6)  Lisinopril 20 Mg Tabs (Lisinopril) .... One by mouth daily 7)  Glimepiride 4 Mg Tabs (Glimepiride) .... One every morning 8)  Metformin Hcl 1000 Mg Tabs (Metformin hcl) .... One twice daily 9)  Tramadol Hcl 50 Mg Tabs (Tramadol hcl) .... One to two by mouth q 6 hours  prn 10)  Furosemide 20 Mg Tabs (Furosemide) .... Take one every morning as needed swelling 11)  Propoxyphene N-apap 100-650 Mg Tabs (Propoxyphene n-apap) .... One every 6 hours as needed for pain 12)  Cyclobenzaprine Hcl 5 Mg Tabs (Cyclobenzaprine hcl) .... One twice daily as needed for back pain 13)  Triamcinolone Acetonide 0.1 % Crea (Triamcinolone acetonide) .... Used twice daily as needed to control for rash 14)  Lipitor 40 Mg Tabs (Atorvastatin calcium) .... Qd  Other Orders: Capillary Blood Glucose/CBG (50093)  Patient Instructions: 1)  Please schedule a follow-up appointment in 2 months. 2)  Limit your Sodium (Salt). 3)  It is important that you exercise regularly at least 20 minutes 5 times a week. If you develop chest pain, have severe difficulty breathing, or feel very tired , stop exercising immediately and seek medical attention. 4)  You need to lose weight. Consider a lower calorie diet and regular exercise.  5)  Take calcium +Vitamin D daily. 6)  Check your blood sugars regularly. If your readings are usually above :  or below 70 you should contact our office. 7)  It is important that your Diabetic A1c level is checked every 3 months. 8)  See your eye doctor yearly to check for diabetic eye damage. Prescriptions: LIPITOR 40 MG TABS (ATORVASTATIN CALCIUM) qd  #90 x 3   Entered and Authorized by:   Gordy Savers  MD   Signed by:   Gordy Savers  MD on 03/19/2010   Method used:   Faxed to ...       [facility])       [address] [address]       [place], [place]  [address]       Ph: [phone]       Fax: [phone]   RxID:   [id number] METFORMIN HCL 1000 MG TABS (METFORMIN HCL) one twice daily  #180 x 6   Entered and Authorized by:   Gordy Savers  MD   Signed by:   Gordy Savers  MD on 03/19/2010   Method used:   Faxed to ...       [facility] education [facility])       [address] [address]       [place], [place]  [address]       Ph: [phone]       Fax: [phone]   RxID:   [id number] GLIMEPIRIDE 4 MG TABS (GLIMEPIRIDE) one every morning  #90 x 3   Entered and Authorized by:   Gordy Savers  MD   Signed by:   Gordy Savers  MD on 03/19/2010   Method used:   Faxed to ...       [facility] education [facility])       [address] [address]       [place], [place]  [address]       Ph: [phone]       Fax: [phone]   RxID:   [id number] LISINOPRIL 20 MG TABS (LISINOPRIL) one by mouth daily  #90 x 4   Entered and Authorized by:   Gordy Savers  MD   Signed by:   Gordy Savers  MD on 03/19/2010   Method used:   Faxed to ...       [facility] Environmental education officer)       [address] [address]       [place], [place]  [address]       Ph: [phone]  Fax: [phone]   RxID:   [id number] ACTOS 15 MG TABS (PIOGLITAZONE HCL) one by mouth daily  #90 x 4   Entered and Authorized by:   Gordy Savers  MD   Signed by:   Gordy Savers  MD on 03/19/2010   Method used:   Faxed to ...       [facility] Environmental education officer)       [address] [address]       [place], [place]   [address]       Ph: [phone]       Fax: [phone]   RxID:   [id number] PANTOPRAZOLE SODIUM 40 MG TBEC (PANTOPRAZOLE SODIUM) one daily  #90 x 4   Entered and Authorized by:   Gordy Savers  MD   Signed by:   Gordy Savers  MD on 03/19/2010   Method used:   Faxed to ...       [facility] Environmental education officer)       [address] [address]       [place], [place]  [address]       Ph: [phone]       Fax: [phone]   RxID:   [id number] CHLORTHALIDONE 25 MG TABS (CHLORTHALIDONE) one daily  #90 x 4   Entered and Authorized by:   Gordy Savers  MD   Signed by:   Gordy Savers  MD on 03/19/2010   Method used:   Faxed to ...       [facility] Environmental education officer)       [address] [address]       [place], [place]  [address]       Ph: [phone]       Fax: [phone]   RxID:   [id number] NORVASC 10 MG TABS (AMLODIPINE BESYLATE) take one tab once daily  #90 x 3   Entered and Authorized by:   Gordy Savers  MD   Signed by:   Gordy Savers  MD on 03/19/2010   Method used:   Faxed to ...       [facility] Environmental education officer)       [address] [address]       [place], [place]  [address]       Ph: [phone]       Fax: [phone]   RxID:   [id number] LIPITOR 40 MG TABS (ATORVASTATIN CALCIUM) qd  #90 x 3   Entered and Authorized by:   Gordy Savers  MD   Signed by:   Gordy Savers  MD on 03/19/2010   Method used:   Print then Give to Patient   RxID:   [id number] METFORMIN HCL 1000 MG TABS (METFORMIN HCL) one twice daily  #180 x 6   Entered and Authorized by:   Gordy Savers  MD   Signed by:   Gordy Savers  MD on 03/19/2010   Method used:   Print then Give to Patient   RxID:   [id number] GLIMEPIRIDE 4 MG TABS (GLIMEPIRIDE) one every morning  #90 x 3   Entered and Authorized by:   Gordy Savers  MD   Signed by:   Gordy Savers  MD on 03/19/2010   Method used:   Print then Give to Patient   RxID:   [id number] LISINOPRIL  20 MG TABS (LISINOPRIL) one by mouth daily  #90 x 4   Entered and Authorized by:   Gordy Savers  MD   Signed by:  Gordy Savers  MD on 03/19/2010   Method used:   Print then Give to Patient   RxID:   [id number] ACTOS 15 MG TABS (PIOGLITAZONE HCL) one by mouth daily  #90 x 4   Entered and Authorized by:   Gordy Savers  MD   Signed by:   Gordy Savers  MD on 03/19/2010   Method used:   Print then Give to Patient   RxID:   [id number] PANTOPRAZOLE SODIUM 40 MG TBEC (PANTOPRAZOLE SODIUM) one daily  #90 x 4   Entered and Authorized by:   Gordy Savers  MD   Signed by:   Gordy Savers  MD on 03/19/2010   Method used:   Print then Give to Patient   RxID:   [id number] CHLORTHALIDONE 25 MG TABS (CHLORTHALIDONE) one daily  #90 x 4   Entered and Authorized by:   Gordy Savers  MD   Signed by:   Gordy Savers  MD on 03/19/2010   Method used:   Print then Give to Patient   RxID:   [id number] NORVASC 10 MG TABS (AMLODIPINE BESYLATE) take one tab once daily  #90 x 3   Entered and Authorized by:   Gordy Savers  MD   Signed by:   Gordy Savers  MD on 03/19/2010   Method used:   Print then Give to Patient   RxID:   [id number]    Orders Added: 1)  Capillary Blood Glucose/CBG [57322] 2)  Est. Patient 40-64 years [[address]42]

## 2010-06-01 NOTE — Miscellaneous (Signed)
Summary: lec pv  Clinical Lists Changes  Medications: Added new medication of MOVIPREP 100 GM  SOLR (PEG-KCL-NACL-NASULF-NA ASC-C) As per prep instructions. - Signed Rx of MOVIPREP 100 GM  SOLR (PEG-KCL-NACL-NASULF-NA ASC-C) As per prep instructions.;  #1 x 0;  Signed;  Entered by: Ezra Sites RN;  Authorized by: Mardella Layman MD [facility];  Method used: Electronically to [facility] [phone]*, [address], [place], [place]  [address], Ph: [phone], Fax: [phone] Observations: Added new observation of NKA: T (02/01/2010 16:03)    Prescriptions: MOVIPREP 100 GM  SOLR (PEG-KCL-NACL-NASULF-NA ASC-C) As per prep instructions.  #1 x 0   Entered by:   Ezra Sites RN   Authorized by:   Mardella Layman MD [facility]   Signed by:   Ezra Sites RN on 02/01/2010   Method used:   Electronically to        [facility] [phone]* (retail)       [address]       [place], [place]  [address]       Ph: [phone]       Fax: [phone]   RxID:   [id number]

## 2010-06-01 NOTE — Letter (Signed)
Summary: Diabetic Instructions  [facility]  [address] [place], [place] [address]   Phone: [phone]  Fax: [phone]    SUZANNAH BETTES 1960/07/25 MRN: [id number]     ORAL DIABETIC MEDICATION INSTRUCTIONS                                 ( Actos, Glimepiride, Metformin) The day before your procedure:   Take your diabetic pill as you do normally  The day of your procedure:   Do not take your diabetic pill    We will check your blood sugar levels during the admission process and again in Recovery before discharging you home  ________________________________________________________________________

## 2010-06-01 NOTE — Procedures (Signed)
Summary: Colonoscopy  Patient: [name] Note: All result statuses are Final unless otherwise noted.  Tests: (1) Colonoscopy (COL)   COL Colonoscopy           DONE     [facility]     [address]     [place], [place]  [address]           COLONOSCOPY PROCEDURE REPORT           PATIENT:  Lisa, Mata  MR#:  [id number]     BIRTHDATE:  Aug 05, 1960, 49 yrs. old  GENDER:  female     ENDOSCOPIST:  Vania Rea. Jarold Motto, MD, [facility]     REF. BY:     PROCEDURE DATE:  03/05/2010     PROCEDURE:  Average-risk screening colonoscopy     G0121     ASA CLASS:  Class II     INDICATIONS:  Routine Risk Screening     MEDICATIONS:   Fentanyl 50 mcg IV, Versed 6 mg IV           DESCRIPTION OF PROCEDURE:   After the risks benefits and     alternatives of the procedure were thoroughly explained, informed     consent was obtained.  Digital rectal exam was performed and     revealed no abnormalities.   The LB 180AL [id number] endoscope was     introduced through the anus and advanced to the cecum, which was     identified by both the appendix and ileocecal valve, without     limitations.  The quality of the prep was excellent, using     MoviPrep.  The instrument was then slowly withdrawn as the colon     was fully examined.     <<PROCEDUREIMAGES>>           FINDINGS:  No polyps or cancers were seen.  This was otherwise a     normal examination of the colon.   Retroflexed views in the rectum     revealed no abnormalities.    The scope was then withdrawn from     the patient and the procedure completed.           COMPLICATIONS:  None     ENDOSCOPIC IMPRESSION:     1) No polyps or cancers     2) Otherwise normal examination     RECOMMENDATIONS:     1) Continue current colorectal screening recommendations for     "routine risk" patients with a repeat colonoscopy in 10 years.     REPEAT EXAM:  No           ______________________________     Vania Rea. Jarold Motto, MD, [facility]           CC:   Gordy Savers, MD           n.     Rosalie Doctor:   Vania Rea. Patterson at 03/05/2010 02:23 PM           Fatima Sanger, [phone]  Note: An exclamation mark (!) indicates a result that was not dispersed into the flowsheet. Document Creation Date: 03/05/2010 2:23 PM _______________________________________________________________________  (1) Order result status: Final Collection or observation date-time: 03/05/2010 14:18 Requested date-time:  Receipt date-time:  Reported date-time:  Referring Physician:   Ordering Physician: Sheryn Bison [phone]) Specimen Source:  Source: Launa Grill Order Number: [id number] Lab site:   Appended Document: Colonoscopy    Clinical Lists Changes  Observations: Added new observation of  COLONNXTDUE: 03/2020 (03/05/2010 14:50)

## 2010-06-01 NOTE — Progress Notes (Signed)
Summary: [facility] - multiple  Phone Note Refill Request Message from:  Fax from Pharmacy  Refills Requested: Medication #1:  NORVASC 10 MG TABS take one tab once daily  Medication #2:  SIMVASTATIN 80 MG TABS one daily  Medication #3:  METFORMIN HCL 1000 MG TABS one twice daily  Medication #4:  PANTOPRAZOLE SODIUM 40 MG TBEC one daily chlorthalidone 25mg  triamcinolone 0.1% cream lisinopril 20mg   [facility]   Method Requested: Fax to Local Pharmacy Initial call taken by: Duard Brady LPN,  March 09, 2010 9:59 AM    Prescriptions: SIMVASTATIN 80 MG TABS (SIMVASTATIN) one daily  #90 x 1   Entered by:   Duard Brady LPN   Authorized by:   Gordy Savers  MD   Signed by:   Duard Brady LPN on 16/01/9603   Method used:   Historical   RxID:   [id number] NORVASC 10 MG TABS (AMLODIPINE BESYLATE) take one tab once daily  #90 x 1   Entered by:   Duard Brady LPN   Authorized by:   Gordy Savers  MD   Signed by:   Duard Brady LPN on 21/30/8657   Method used:   Historical   RxID:   [id number] METFORMIN HCL 1000 MG TABS (METFORMIN HCL) one twice daily  #180 x 1   Entered by:   Duard Brady LPN   Authorized by:   Gordy Savers  MD   Signed by:   Duard Brady LPN on 05/04/7251   Method used:   Historical   RxID:   [id number] PANTOPRAZOLE SODIUM 40 MG TBEC (PANTOPRAZOLE SODIUM) one daily  #90 x 1   Entered by:   Duard Brady LPN   Authorized by:   Gordy Savers  MD   Signed by:   Duard Brady LPN on 75/64/3329   Method used:   Historical   RxID:   [id number] CHLORTHALIDONE 25 MG TABS (CHLORTHALIDONE) one daily  #90 x 1   Entered by:   Duard Brady LPN   Authorized by:   Gordy Savers  MD   Signed by:   Duard Brady LPN on 09/32/3557   Method used:   Historical   RxID:   [id number] LISINOPRIL 20 MG TABS (LISINOPRIL) one by mouth daily   #90 x 1   Entered by:   Duard Brady LPN   Authorized by:   Gordy Savers  MD   Signed by:   Duard Brady LPN on 83/15/1761   Method used:   Historical   RxID:   [id number] TRIAMCINOLONE ACETONIDE 0.1 % CREA (TRIAMCINOLONE ACETONIDE) used twice daily as needed to control for rash  #60 gm x 1   Entered by:   Duard Brady LPN   Authorized by:   Gordy Savers  MD   Signed by:   Duard Brady LPN on 27/07/5007   Method used:   Historical   RxID:   [id number]  all [facility]. KIK  Will need to be seen - last seen 04/2009   [facility]

## 2010-06-02 ENCOUNTER — Ambulatory Visit: Payer: BC Managed Care – PPO | Attending: Internal Medicine | Admitting: Physical Therapy

## 2010-06-02 DIAGNOSIS — I69998 Other sequelae following unspecified cerebrovascular disease: Secondary | ICD-10-CM | POA: Insufficient documentation

## 2010-06-02 DIAGNOSIS — R269 Unspecified abnormalities of gait and mobility: Secondary | ICD-10-CM | POA: Insufficient documentation

## 2010-06-02 DIAGNOSIS — IMO0001 Reserved for inherently not codable concepts without codable children: Secondary | ICD-10-CM | POA: Insufficient documentation

## 2010-06-03 NOTE — Progress Notes (Signed)
Summary: test strips  Phone Note Call from Patient   Caller: Patient Call For: Gordy Savers  MD Summary of Call: VM inqiuring about if we has recv'd fax r/t test strips.   [phone] Initial call taken by: Duard Brady LPN,  May 13, 2010 10:01 AM  Follow-up for Phone Call        spoke with pt - has post hospital appt soon - does not need strips at this time - istructed to bring meter and meds to appt. KIK Follow-up by: Duard Brady LPN,  May 13, 2010 11:46 AM

## 2010-06-03 NOTE — Letter (Signed)
Summary: Initial Disability Claim Form/[employer]  Initial Disability Claim Form/[employer]   Imported By: Maryln Gottron 05/27/2010 15:04:24  _____________________________________________________________________  External Attachment:    Type:   Image     Comment:   External Document

## 2010-06-03 NOTE — Progress Notes (Signed)
Summary: forms ready for pick up  Phone Note Outgoing Call   Call placed by: Duard Brady LPN,  May 24, 2010 1:47 PM Call placed to: Patient Action Taken: Phone Call Completed Summary of Call: attempt to call - ans mach at hm# - LMTCB if questions - formes x2 ready for pick up   KIK Initial call taken by: Duard Brady LPN,  May 24, 2010 1:48 PM

## 2010-06-03 NOTE — Assessment & Plan Note (Signed)
Summary: increasing CVA symptoms/dm   Vital Signs:  Patient profile:   50 year old female Weight:      213 pounds Temp:     98.1 degrees F oral BP sitting:   128 / 80  (left arm) Cuff size:   regular  Vitals Entered By: Duard Brady LPN (May 10, 2010 10:38 AM) CC: concerns about gait getting worse and (R) arm'issues' Is Patient Diabetic? Yes Did you bring your meter with you today? No CBG Result 159   CC:  concerns about gait getting worse and (R) arm'issues'.  History of Present Illness: 32 -year-old, patient who has a history of cerebrovascular disease.  She was seen here 3 days ago, and also evaluated at the hospital on December 31. She complained of an  unsteady gait at that time.  A head CT was negative.  The patient was admitted to the hospital in March of 2010 and and at that time a brain MRI confirmed an acute ischemic infarct involving the right paramedial distal pons region.  Her hemoglobin A1c3 days ago, was 9.3.  When seen last Friday,  Brain MRI was ordered, but has not yet been performed.  She was notified today to come in for evaluation and to initiate insulin therapy; however, over the weekend, she states that her gait abnormality has worsened with a tendency to fall to the right. Random blood sugar today 159. Clinical examination today revealed worsening of her unsteady gait. She will now be admitted to the hospital for further neurological valuation and to initiate insulin therapy.   Allergies (verified): No Known Drug Allergies  Past History:  Past Medical History: Reviewed history from 03/06/2009 and no changes required. 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 gravida one, para zero, abortus one foot surgery 2000 colonoscopy 2011 status post hysterectomy 2003  Family History: Reviewed history from 07/15/2008 and no changes required. father died age 41, stomach cancer mother died at 52, complications of hypertension, and cerebrovascular disease  One brother, history of asthma two sisters positive for hypertension, diabetes, cardiopathy hepatitis, and thyroid disease  Social History: Reviewed history from 03/19/2010 and no changes required. Divorced Regular exercise-yes  Review of Systems       The patient complains of difficulty walking.  The patient denies anorexia, fever, weight loss, weight gain, vision loss, hoarseness, chest pain, syncope, dyspnea on exertion, peripheral edema, prolonged cough, headaches, hemoptysis, abdominal pain, melena, hematochezia, severe indigestion/heartburn, hematuria, incontinence, genital sores, muscle weakness, suspicious skin lesions, transient blindness, depression, unusual weight change, abnormal bleeding, enlarged lymph nodes, angioedema, breast masses, and testicular masses.    Physical Exam  General:  overweight-appearing.  122/80 Head:  Normocephalic and atraumatic without obvious abnormalities. No apparent alopecia or balding. Eyes:  No corneal or conjunctival inflammation noted. EOMI. Perrla. Funduscopic exam benign, without hemorrhages, exudates or papilledema. Vision grossly normal. Ears:  External ear exam shows no significant lesions or deformities.  Otoscopic examination reveals clear canals, tympanic membranes are intact bilaterally without bulging, retraction, inflammation or discharge. Hearing is grossly normal bilaterally. Mouth:  Oral mucosa and oropharynx without lesions or exudates.  Teeth in good repair. Neck:  No deformities, masses, or tenderness noted. Chest Wall:  No deformities, masses, or tenderness noted. Lungs:  Normal respiratory  effort, chest expands symmetrically.  Lungs are clear to auscultation, no crackles or wheezes. Heart:  Normal rate and regular rhythm. S1 and S2 normal without gallop, murmur, click, rub or other extra sounds. Abdomen:  Bowel sounds positive,abdomen soft and non-tender without masses, organomegaly or hernias noted. Msk:  No deformity or scoliosis noted of thoracic or lumbar spine.   Pulses:  R and L carotid,radial,femoral,dorsalis pedis and posterior tibial pulses are full and equal bilaterally Extremities:  No clubbing, cyanosis, edema, or deformity noted with normal full range of motion of all joints.   Neurologic:  alert & oriented X3, cranial nerves II-XII intact, strength normal in all extremities, and sensation intact to light touch.   patient has any abnormal gait unsteady  and  shuffling poor  finger-to-nose testing on the  right and poor heel-to-shin testing on the right Skin:  Intact without suspicious lesions or rashes Cervical Nodes:  No lymphadenopathy noted Psych:  Cognition and judgment appear intact. Alert and cooperative with normal attention span and concentration. No apparent delusions, illusions, hallucinations   Impression & Recommendations:  Problem # 1:  ABNORMALITY OF GAIT (ICD-781.2) patient has most likely suffered a subacute ischemic stroke. Will admit  to the hospital for further neurological evaluation  Problem # 2:  HYPERLIPIDEMIA (ICD-272.4)  Her updated medication list for this problem includes:    Pravastatin Sodium 40 Mg Tabs (Pravastatin sodium) ..... One daily  Problem # 3:  CEREBROVASCULAR DISEASE (ICD-437.9)  Problem # 4:  DIABETES MELLITUS, TYPE II (ICD-250.00)  Her updated medication list for this problem includes:    Sb Aspirin 325 Mg Tabs (Aspirin) ..... Once daily    Actos 15 Mg Tabs (Pioglitazone hcl) ..... One by mouth daily    Lisinopril 20 Mg Tabs (Lisinopril) ..... One by mouth  twice daily    Glimepiride 4 Mg Tabs (Glimepiride) ..... One every morning    Metformin Hcl 1000  Mg Tabs (Metformin hcl) ..... One twice daily  Orders: Capillary Blood Glucose/CBG (16109)  Complete Medication List: 1)  Norvasc 10 Mg Tabs (Amlodipine besylate) .... Take one tab once daily 2)  Sb Aspirin 325 Mg Tabs (Aspirin) .... Once daily 3)  Chlorthalidone 25 Mg Tabs (Chlorthalidone) .... One daily 4)  Pantoprazole Sodium 40 Mg Tbec (Pantoprazole sodium) .... One daily 5)  Actos 15 Mg Tabs (Pioglitazone hcl) .... One by mouth daily 6)  Lisinopril 20 Mg Tabs (Lisinopril) .... One by mouth  twice daily 7)  Glimepiride 4 Mg Tabs (Glimepiride) .... One every morning 8)  Metformin Hcl 1000 Mg Tabs (Metformin hcl) .... One twice daily 9)  Tramadol Hcl 50 Mg Tabs (Tramadol hcl) .... One to two by mouth q 6 hours prn 10)  Furosemide 20 Mg Tabs (Furosemide) .... Take one every morning as needed swelling 11)  Pravastatin Sodium 40 Mg Tabs (Pravastatin sodium) .... One daily  Patient Instructions: 1)  will admit  to the hospital for further neurologic evaluation and to initiate insulin therapy   Orders Added: 1)  Capillary Blood Glucose/CBG [82948] 2)  Est. Patient Level V [60454]

## 2010-06-03 NOTE — Assessment & Plan Note (Signed)
Summary: post hospital   kik   Vital Signs:  Patient profile:   50 year old female Weight:      216 pounds Temp:     97.7 degrees F oral BP sitting:   118 / 70  (right arm) Cuff size:   regular  Vitals Entered By: Duard Brady LPN (May 24, 2010 9:58 AM) CC: post hospital - doing ok , started PT         fbs 139 Is Patient Diabetic? Yes Did you bring your meter with you today? Yes   CC:  post hospital - doing ok  and started PT         fbs 139.  History of Present Illness: 60 -year-old patient who is seen today following a hospital discharge for a recurrent subacute cerebellar stroke.  She has improved, but still having some dizziness, unsteadiness of gait, and weakness.  She has been receiving outpatient physical therapy.  She has two disability forms to require a completion.  She has not been referred for comprehensive diabetic teaching.  She presently is on mealtime insulin, but no basal insulin.  She remains on metformin.  Other oral anti-diabetic  medications have been discontinued.  She feels that she is improving daily.  She has treated hypertension and dyslipidemia.  She does have some difficulty with her handwriting due to her recent stroke.  Her hemoglobin A1c was 9.5 during the hospital.  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 father died age 11, stomach  cancer mother died at 41, complications of hypertension, and cerebrovascular disease  One brother, history of asthma two sisters positive for hypertension, diabetes, cardiopathy hepatitis, and thyroid disease  Social History: Reviewed history from 03/19/2010 and no changes required. Divorced Regular exercise-yes  Review of Systems       The patient complains of muscle weakness and difficulty walking.  The patient denies anorexia, fever, weight loss, weight gain, vision loss, decreased hearing, hoarseness, chest pain, syncope, dyspnea on exertion, peripheral edema, prolonged cough, headaches, hemoptysis, abdominal pain, melena, hematochezia, severe indigestion/heartburn, hematuria, incontinence, genital sores, suspicious skin lesions, transient blindness, depression, unusual weight change, abnormal bleeding, enlarged lymph nodes, angioedema, and breast masses.    Physical Exam  General:  Well-developed,well-nourished,in no acute distress; alert,appropriate and cooperative throughout examination Head:  Normocephalic and atraumatic without obvious abnormalities. No apparent alopecia or balding. Eyes:  No corneal or conjunctival inflammation noted. EOMI. Perrla. Funduscopic exam benign, without hemorrhages, exudates or papilledema. Vision grossly normal. Ears:  External ear exam shows no significant lesions or deformities.  Otoscopic examination reveals clear canals, tympanic membranes are intact bilaterally without bulging, retraction, inflammation or discharge. Hearing is grossly normal bilaterally. Mouth:  Oral mucosa and oropharynx without lesions or exudates.  Teeth in good repair. Neck:  No deformities, masses, or tenderness noted. Lungs:  Normal respiratory effort, chest expands symmetrically. Lungs are clear to auscultation, no crackles or wheezes. Heart:  Normal rate and regular rhythm. S1 and S2 normal without gallop, murmur,  click, rub or other extra sounds. Abdomen:  Bowel sounds  positive,abdomen soft and non-tender without masses, organomegaly or hernias noted. Msk:  No deformity or scoliosis noted of thoracic or lumbar spine.   Pulses:  R and L carotid,radial,femoral,dorsalis pedis and posterior tibial pulses are full and equal bilaterally Extremities:  No clubbing, cyanosis, edema, or deformity noted with normal full range of motion of all joints.   Neurologic:  mild right-sided dyspraxiaalert & oriented X3, cranial nerves II-XII intact, strength normal in all extremities, and sensation intact to light touch.   gait mildly unsteady   Impression & Recommendations:  Problem # 1:  ABNORMALITY OF GAIT (ICD-781.2)  Problem # 2:  HYPERLIPIDEMIA (ICD-272.4)  Her updated medication list for this problem includes:    Pravastatin Sodium 40 Mg Tabs (Pravastatin sodium) ..... One daily  Problem # 3:  CEREBROVASCULAR DISEASE (ICD-437.9) will complete all disability papers  Problem # 4:  DIABETES MELLITUS, TYPE II (ICD-250.00)  The following medications were removed from the medication list:    Sb Aspirin 325 Mg Tabs (Aspirin) ..... Once daily    Actos 15 Mg Tabs (Pioglitazone hcl) ..... One by mouth daily    Glimepiride 4 Mg Tabs (Glimepiride) ..... One every morning Her updated medication list for this problem includes:    Lisinopril 20 Mg Tabs (Lisinopril) ..... One by mouth  twice daily    Metformin Hcl 1000 Mg Tabs (Metformin hcl) ..... One twice daily    Aspirin 81 Mg Tbec (Aspirin) ..... Once daily x78months then stop    Novolog Flexpen 100 Unit/ml Soln (Insulin aspart) ..... Sliding scale - bloos sugar <120 - 0 unit : 121-150 -2units : 151-200 -3 units : 201-250 - 5 units : 251-300 - 8 units : 301 350 - 11 units : 351-400 - 15 units    Levemir Flexpen 100 Unit/ml Soln (Insulin detemir) .Marland KitchenMarland KitchenMarland KitchenMarland Kitchen 10 units at bedtime  Orders: Diabetic Clinic Referral (Diabetic)  Complete Medication List: 1)  Norvasc 10 Mg Tabs (Amlodipine besylate) .... Take one tab once daily 2)   Chlorthalidone 25 Mg Tabs (Chlorthalidone) .... One daily 3)  Pantoprazole Sodium 40 Mg Tbec (Pantoprazole sodium) .... One daily 4)  Lisinopril 20 Mg Tabs (Lisinopril) .... One by mouth  twice daily 5)  Metformin Hcl 1000 Mg Tabs (Metformin hcl) .... One twice daily 6)  Tramadol Hcl 50 Mg Tabs (Tramadol hcl) .... One to two by mouth q 6 hours prn 7)  Pravastatin Sodium 40 Mg Tabs (Pravastatin sodium) .... One daily 8)  Aspirin 81 Mg Tbec (Aspirin) .... Once daily x60months then stop 9)  Plavix 75 Mg Tabs (Clopidogrel bisulfate) .... Qd 10)  Novolog Flexpen 100 Unit/ml Soln (Insulin aspart) .... Sliding scale - bloos sugar <120 - 0 unit : 121-150 -2units : 151-200 -3 units : 201-250 - 5 units : 251-300 - 8 units : 301 350 - 11 units : 351-400 - 15 units 11)  Onetouch Ultra 2 W/device Kit (Blood glucose monitoring suppl) 12)  Onetouch Ultra Blue Strp (Glucose blood) .... Three times a day and prn 13)  Onetouch Ultrasoft Lancets Misc (Lancets) .... Three times a day and prn 14)  Levemir Flexpen 100 Unit/ml Soln (Insulin detemir) .[name].. 10 units at bedtime  Patient Instructions: 1)  Please schedule a follow-up appointment in 1 month. 2)  Limit your Sodium (Salt). 3)  It is important that you exercise regularly at least 20 minutes 5 times a week. If you develop chest pain, have severe difficulty  breathing, or feel very tired , stop exercising immediately and seek medical attention. 4)  Check your blood sugars regularly. If your readings are usually above : or below 70 you should contact our office. 5)  It is important that your Diabetic A1c level is checked every 3 months. 6)  See your eye doctor yearly to check for diabetic eye damage. 7)  physical therapy follow up as scheduled Prescriptions: ONETOUCH ULTRASOFT LANCETS  MISC (LANCETS) three times a day and prn  #300 x 3   Entered by:   Duard Brady LPN   Authorized by:   Gordy Savers  MD   Signed by:   Gordy Savers  MD on  05/24/2010   Method used:   Electronically to        [facility] [phone]* (retail)       [address]       [place], [place]  [address]       Ph: [phone]       Fax: [phone]   RxID:   [id number] ONETOUCH ULTRA BLUE  STRP (GLUCOSE BLOOD) three times a day and prn  #300 x 3   Entered by:   Duard Brady LPN   Authorized by:   Gordy Savers  MD   Signed by:   Gordy Savers  MD on 05/24/2010   Method used:   Electronically to        [facility] [phone]* (retail)       [address]       [place], [place]  [address]       Ph: [phone]       Fax: [phone]   RxID:   [id number]    Orders Added: 1)  Est. Patient Level IV [67893] 2)  Diabetic Clinic Referral [Diabetic]

## 2010-06-03 NOTE — Assessment & Plan Note (Signed)
Summary: neurological sympoms/unbalanced./dm   Vital Signs:  Patient profile:   50 year old female Weight:      221 pounds Temp:     97.9 degrees F oral BP sitting:   120 / 80  (right arm) Cuff size:   regular  Vitals Entered By: Duard Brady LPN (May 07, 2010 2:14 PM) CC: unsteady gait  Is Patient Diabetic? Yes Did you bring your meter with you today? No CBG Result 108   CC:  unsteady gait .  History of Present Illness: 20 -year-old patient who has a history of cerebrovascular disease.  She presents with a chief complaint of unsteady gait.  She was evaluated at the hospital on 1231 2011 and a head CT was unremarkable.  She was admitted to the hospital in March of 2010 an MRI confirmed an acute ischemic infarct involving the right paramedial distal ponds area.  Prior head CT was negative.  She is seen here 3 months ago for a physical.  At that time, she had poor hypertensive and diabetic control due to poor adherence with medication.  She states that she has been compliant with all her medications except Lipitor, which she was not able to afford.  She denies any headaches, double vision, speech or swallowing difficulties.  hospital  records, and head CT reviewed  Allergies (verified): No Known Drug Allergies  Past History:  Past Medical History: Reviewed history from 03/06/2009 and no changes required. 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  The patient denies anorexia, fever, weight loss, weight gain, vision loss, decreased hearing, hoarseness, chest pain, syncope, dyspnea on exertion, peripheral edema, prolonged cough, headaches, hemoptysis, abdominal pain, melena, hematochezia, severe  indigestion/heartburn, hematuria, incontinence, genital sores, muscle weakness, suspicious skin lesions, transient blindness, depression, unusual weight change, abnormal bleeding, enlarged lymph nodes, angioedema, and breast masses.    Physical Exam  General:  overweight-appearing.  160/90overweight-appearing.   Head:  Normocephalic and atraumatic without obvious abnormalities. No apparent alopecia or balding. Eyes:  No corneal or conjunctival inflammation noted. EOMI. Perrla. Funduscopic exam benign, without hemorrhages, exudates or papilledema. Vision grossly normal. Mouth:  Oral mucosa and oropharynx without lesions or exudates.  Teeth in good repair. Neck:  No deformities, masses, or tenderness noted. Lungs:  Normal respiratory effort, chest expands symmetrically. Lungs are clear to auscultation, no crackles or wheezes. Heart:  Normal rate and regular rhythm. S1 and S2 normal without gallop, murmur, click, rub or other extra sounds. Abdomen:  Bowel sounds positive,abdomen soft and non-tender without masses, organomegaly or hernias noted. Neurologic:  alert & oriented X3, cranial nerves II-XII intact, and strength normal in all extremities.  finger-to-nose, and heel-to-shin testing, slightly clumsy slightly unsteady shuffling gait;  not grossly ataxicalert & oriented X3, cranial nerves II-XII intact, and strength normal in all extremities.     Impression & Recommendations:  Problem # 1:  CEREBROVASCULAR DISEASE (ICD-437.9)  in spite of a recent negative.  Head CT, I believe the patient has probably suffered another small ischemic insult.  This factor modification discussed at length.  Will check a brain MRI  Orders: Radiology Referral (Radiology)  Problem # 2:  HYPERTENSION (ICD-401.9)  Her updated medication list for this problem includes:    Norvasc 10 Mg  Tabs (Amlodipine besylate) .[name]... Take one tab once daily    Chlorthalidone 25 Mg Tabs (Chlorthalidone) ..... One daily     Lisinopril 20 Mg Tabs (Lisinopril) ..... One by mouth  twice daily    Furosemide 20 Mg Tabs (Furosemide) .[name]... Take one every morning as needed swelling  Her updated medication list for this problem includes:    Norvasc 10 Mg Tabs (Amlodipine besylate) .[name]... Take one tab once daily    Chlorthalidone 25 Mg Tabs (Chlorthalidone) ..... One daily    Lisinopril 20 Mg Tabs (Lisinopril) ..... One by mouth  twice daily    Furosemide 20 Mg Tabs (Furosemide) .[name]... Take one every morning as needed swelling  Problem # 3:  DIABETES MELLITUS, TYPE II (ICD-250.00)  Her updated medication list for this problem includes:    Sb Aspirin 325 Mg Tabs (Aspirin) ..... Once daily    Actos 15 Mg Tabs (Pioglitazone hcl) ..... One by mouth daily    Lisinopril 20 Mg Tabs (Lisinopril) ..... One by mouth  twice daily    Glimepiride 4 Mg Tabs (Glimepiride) ..... One every morning    Metformin Hcl 1000 Mg Tabs (Metformin hcl) ..... One twice daily  Orders: Capillary Blood Glucose/CBG (87564) Venipuncture (33295) TLB-A1C / Hgb A1C (Glycohemoglobin) (83036-A1C) Specimen Handling (18841)  Her updated medication list for this problem includes:    Sb Aspirin 325 Mg Tabs (Aspirin) ..... Once daily    Actos 15 Mg Tabs (Pioglitazone hcl) ..... One by mouth daily    Lisinopril 20 Mg Tabs (Lisinopril) ..... One by mouth  twice daily    Glimepiride 4 Mg Tabs (Glimepiride) ..... One every morning    Metformin Hcl 1000 Mg Tabs (Metformin hcl) ..... One twice daily  Complete Medication List: 1)  Norvasc 10 Mg Tabs (Amlodipine besylate) .... Take one tab once daily 2)  Sb Aspirin 325 Mg Tabs (Aspirin) .... Once daily 3)  Chlorthalidone 25 Mg Tabs (Chlorthalidone) .... One daily 4)  Pantoprazole Sodium 40 Mg Tbec (Pantoprazole sodium) .... One daily 5)  Actos 15 Mg Tabs (Pioglitazone hcl) .... One by mouth daily 6)  Lisinopril 20 Mg Tabs (Lisinopril) .... One by mouth  twice daily 7)  Glimepiride 4 Mg Tabs (Glimepiride) ....  One every morning 8)  Metformin Hcl 1000 Mg Tabs (Metformin hcl) .... One twice daily 9)  Tramadol Hcl 50 Mg Tabs (Tramadol hcl) .... One to two by mouth q 6 hours prn 10)  Furosemide 20 Mg Tabs (Furosemide) .... Take one every morning as needed swelling 11)  Pravastatin Sodium 40 Mg Tabs (Pravastatin sodium) .... One daily  Patient Instructions: 1)  Limit your Sodium (Salt) to less than 2 grams a day(slightly less than 1/2 a teaspoon) to prevent fluid retention, swelling, or worsening of symptoms. 2)  It is important that you exercise regularly at least 20 minutes 5 times a week. If you develop chest pain, have severe difficulty breathing, or feel very tired , stop exercising immediately and seek medical attention. 3)  You need to lose weight. Consider a lower calorie diet and regular exercise.  4)  Check your blood sugars regularly. If your readings are usually above : or below 70 you should contact our office. 5)  It is important that your Diabetic A1c level is checked every 3 months. 6)  See your eye doctor yearly to check for diabetic eye damage. 7)  Please schedule a follow-up appointment in 2 weeks. Prescriptions: PRAVASTATIN SODIUM 40 MG TABS (PRAVASTATIN SODIUM) one daily  #  90 x 6   Entered and Authorized by:   Gordy Savers  MD   Signed by:   Gordy Savers  MD on 05/07/2010   Method used:   Electronically to        [facility] [phone]* (retail)       [address]       [place], [place]  [address]       Ph: [phone]       Fax: [phone]   RxID:   [id number] LISINOPRIL 20 MG TABS (LISINOPRIL) one by mouth  twice daily  #180 x 6   Entered and Authorized by:   Gordy Savers  MD   Signed by:   Gordy Savers  MD on 05/07/2010   Method used:   Electronically to        [facility] [phone]* (retail)       [address]       [place], [place]  [address]       Ph: [phone]       Fax: [phone]   RxID:    [id number]    Orders Added: 1)  Capillary Blood Glucose/CBG [10932] 2)  Est. Patient Level IV [35573] 3)  Venipuncture [22025] 4)  TLB-A1C / Hgb A1C (Glycohemoglobin) [83036-A1C] 5)  Radiology Referral [Radiology] 6)  Specimen Handling [99000]

## 2010-06-07 ENCOUNTER — Ambulatory Visit: Payer: BC Managed Care – PPO | Admitting: Physical Therapy

## 2010-06-09 ENCOUNTER — Ambulatory Visit: Payer: BC Managed Care – PPO | Admitting: Physical Therapy

## 2010-06-14 ENCOUNTER — Ambulatory Visit: Payer: BC Managed Care – PPO | Admitting: Physical Therapy

## 2010-06-16 ENCOUNTER — Ambulatory Visit: Payer: BC Managed Care – PPO | Admitting: Physical Therapy

## 2010-06-30 ENCOUNTER — Telehealth: Payer: Self-pay

## 2010-06-30 NOTE — Telephone Encounter (Signed)
needa a letter stateing it os ok to return to work and any restrictions

## 2010-07-01 NOTE — Telephone Encounter (Signed)
Please call and schedule rov to eval return to work per Dr. Amador Cunas

## 2010-07-01 NOTE — Telephone Encounter (Signed)
Lft 2 vms for pt to cb and sch ov per Dr Amador Cunas. Waiting on call back.

## 2010-07-01 NOTE — Telephone Encounter (Signed)
ROV tomarrow or next week to evaluate

## 2010-07-07 ENCOUNTER — Encounter: Payer: Self-pay | Admitting: Internal Medicine

## 2010-07-08 ENCOUNTER — Ambulatory Visit (INDEPENDENT_AMBULATORY_CARE_PROVIDER_SITE_OTHER): Payer: BC Managed Care – PPO | Admitting: Internal Medicine

## 2010-07-08 ENCOUNTER — Encounter: Payer: Self-pay | Admitting: Internal Medicine

## 2010-07-08 DIAGNOSIS — E785 Hyperlipidemia, unspecified: Secondary | ICD-10-CM

## 2010-07-08 DIAGNOSIS — R269 Unspecified abnormalities of gait and mobility: Secondary | ICD-10-CM

## 2010-07-08 DIAGNOSIS — L2089 Other atopic dermatitis: Secondary | ICD-10-CM

## 2010-07-08 DIAGNOSIS — E119 Type 2 diabetes mellitus without complications: Secondary | ICD-10-CM

## 2010-07-08 DIAGNOSIS — I679 Cerebrovascular disease, unspecified: Secondary | ICD-10-CM

## 2010-07-08 DIAGNOSIS — I10 Essential (primary) hypertension: Secondary | ICD-10-CM

## 2010-07-08 MED ORDER — INSULIN DETEMIR 100 UNIT/ML ~~LOC~~ SOLN: 14.0000 [IU] | Freq: Every day | SUBCUTANEOUS | Status: DC

## 2010-07-08 MED ORDER — PANTOPRAZOLE SODIUM 40 MG PO TBEC: 40.0000 mg | DELAYED_RELEASE_TABLET | Freq: Every day | ORAL | Status: DC

## 2010-07-08 MED ORDER — ONETOUCH ULTRASOFT LANCETS MISC: Status: DC

## 2010-07-08 MED ORDER — LISINOPRIL 20 MG PO TABS: 20.0000 mg | ORAL_TABLET | Freq: Two times a day (BID) | ORAL | Status: DC

## 2010-07-08 MED ORDER — CHLORTHALIDONE 25 MG PO TABS: 25.0000 mg | ORAL_TABLET | Freq: Every day | ORAL | Status: DC

## 2010-07-08 MED ORDER — TRIAMCINOLONE ACETONIDE 0.5 % EX OINT: TOPICAL_OINTMENT | Freq: Two times a day (BID) | CUTANEOUS | Status: DC

## 2010-07-08 MED ORDER — TRAMADOL HCL 50 MG PO TABS: 50.0000 mg | ORAL_TABLET | Freq: Four times a day (QID) | ORAL | Status: DC | PRN

## 2010-07-08 MED ORDER — GLUCOSE BLOOD VI STRP: ORAL_STRIP | Status: DC

## 2010-07-08 MED ORDER — METFORMIN HCL 1000 MG PO TABS: 1000.0000 mg | ORAL_TABLET | Freq: Two times a day (BID) | ORAL | Status: DC

## 2010-07-08 MED ORDER — AMLODIPINE BESYLATE 10 MG PO TABS: 10.0000 mg | ORAL_TABLET | Freq: Every day | ORAL | Status: DC

## 2010-07-08 MED ORDER — CLOPIDOGREL BISULFATE 75 MG PO TABS: 75.0000 mg | ORAL_TABLET | Freq: Every day | ORAL | Status: DC

## 2010-07-08 MED ORDER — INSULIN ASPART 100 UNIT/ML ~~LOC~~ SOLN: SUBCUTANEOUS | Status: DC

## 2010-07-08 MED ORDER — PRAVASTATIN SODIUM 40 MG PO TABS: 40.0000 mg | ORAL_TABLET | Freq: Every day | ORAL | Status: DC

## 2010-07-08 NOTE — Progress Notes (Signed)
  Subjective:    Patient ID: [name], female    DOB: [birth year], 50 y.o.   MRN: [id number]  HPI   50 year old patient who is seen today for followup of her type 2 diabetes. She is on  Maintenance  as well as mealtime insulin.   Fasting blood sugars are never less than 100 and occasionally are over 200. She is on Levemir 10 units at bedtime. She is on a very modest mealtime sliding scale regimen since leaving the hospital. She states that she feels well and is anxious to return to work. Her neurological symptoms are stable. She states she requires a note to return to work when they are requiring. Hemoglobin A1c is for the past few months and they consistently over 9    Review of Systems  Constitutional: Negative.   HENT: Negative for hearing loss, congestion, sore throat, rhinorrhea, dental problem, sinus pressure and tinnitus.   Eyes: Negative for pain, discharge and visual disturbance.  Respiratory: Negative for cough and shortness of breath.   Cardiovascular: Negative for chest pain, palpitations and leg swelling.  Gastrointestinal: Negative for nausea, vomiting, abdominal pain, diarrhea, constipation, blood in stool and abdominal distention.  Genitourinary: Negative for dysuria, urgency, frequency, hematuria, flank pain, vaginal bleeding, vaginal discharge, difficulty urinating, vaginal pain and pelvic pain.  Musculoskeletal: Positive for gait problem. Negative for joint swelling and arthralgias.  Skin: Negative for rash.  Neurological: Negative for dizziness, syncope, speech difficulty, weakness, numbness and headaches.  Hematological: Negative for adenopathy.  Psychiatric/Behavioral: Negative for behavioral problems, dysphoric mood and agitation. The patient is not nervous/anxious.        Objective:   Physical Exam  Constitutional: She is oriented to person, place, and time. She appears well-developed and well-nourished.        overweight  HENT:  Head: Normocephalic.  Right  Ear: External ear normal.  Left Ear: External ear normal.  Mouth/Throat: Oropharynx is clear and moist.  Eyes: Conjunctivae and EOM are normal. Pupils are equal, round, and reactive to light.  Neck: Normal range of motion. Neck supple. No thyromegaly present.  Cardiovascular: Normal rate, regular rhythm, normal heart sounds and intact distal pulses.   Pulmonary/Chest: Effort normal and breath sounds normal.  Abdominal: Soft. Bowel sounds are normal. She exhibits no mass. There is no tenderness.  Musculoskeletal: Normal range of motion.  Lymphadenopathy:    She has no cervical adenopathy.  Neurological: She is alert and oriented to person, place, and time.  Skin: Skin is warm and dry. No rash noted.  Psychiatric: She has a normal mood and affect. Her behavior is normal.          Assessment & Plan:   diabetes mellitus. We'll further titrate her maintenance and mealtime insulin  Hypertension stable  Cerebrovascular disease-  Stable will write a letter 4 return to work   all medications refilled

## 2010-07-08 NOTE — Patient Instructions (Signed)
It is important that you exercise regularly, at least 20 minutes 3 to 4 times per week.  If you develop chest pain or shortness of breath seek  medical attention.  Limit your sodium (Salt) intake  Please check your blood pressure on a regular basis.  If it is consistently greater than 150/90, please make an office appointment.   Please check your hemoglobin A1c every 3 months   1 Return in one month for follow-up

## 2010-07-12 LAB — POCT I-STAT, CHEM 8
BUN: 16 mg/dL (ref 6–23)
Calcium, Ion: 1.17 mmol/L (ref 1.12–1.32)
Glucose, Bld: 90 mg/dL (ref 70–99)
TCO2: 30 mmol/L (ref 0–100)

## 2010-07-12 LAB — GLUCOSE, CAPILLARY: Glucose-Capillary: 92 mg/dL (ref 70–99)

## 2010-07-13 LAB — GLUCOSE, CAPILLARY: Glucose-Capillary: 231 mg/dL — ABNORMAL HIGH (ref 70–99)

## 2010-07-20 ENCOUNTER — Encounter: Payer: BC Managed Care – PPO | Attending: Internal Medicine | Admitting: *Deleted

## 2010-07-20 DIAGNOSIS — Z713 Dietary counseling and surveillance: Secondary | ICD-10-CM | POA: Insufficient documentation

## 2010-07-20 DIAGNOSIS — E119 Type 2 diabetes mellitus without complications: Secondary | ICD-10-CM | POA: Insufficient documentation

## 2010-08-12 LAB — COMPREHENSIVE METABOLIC PANEL
ALT: 40 U/L — ABNORMAL HIGH (ref 0–35)
Calcium: 9.1 mg/dL (ref 8.4–10.5)
Glucose, Bld: 127 mg/dL — ABNORMAL HIGH (ref 70–99)
Sodium: 136 mEq/L (ref 135–145)
Total Protein: 6.7 g/dL (ref 6.0–8.3)

## 2010-08-12 LAB — URINALYSIS, ROUTINE W REFLEX MICROSCOPIC
Bilirubin Urine: NEGATIVE
Ketones, ur: 15 mg/dL — AB
Leukocytes, UA: NEGATIVE
Nitrite: NEGATIVE
Protein, ur: NEGATIVE mg/dL

## 2010-08-12 LAB — GLUCOSE, CAPILLARY
Glucose-Capillary: 105 mg/dL — ABNORMAL HIGH (ref 70–99)
Glucose-Capillary: 113 mg/dL — ABNORMAL HIGH (ref 70–99)
Glucose-Capillary: 120 mg/dL — ABNORMAL HIGH (ref 70–99)
Glucose-Capillary: 120 mg/dL — ABNORMAL HIGH (ref 70–99)
Glucose-Capillary: 132 mg/dL — ABNORMAL HIGH (ref 70–99)
Glucose-Capillary: 136 mg/dL — ABNORMAL HIGH (ref 70–99)
Glucose-Capillary: 137 mg/dL — ABNORMAL HIGH (ref 70–99)
Glucose-Capillary: 151 mg/dL — ABNORMAL HIGH (ref 70–99)
Glucose-Capillary: 156 mg/dL — ABNORMAL HIGH (ref 70–99)
Glucose-Capillary: 177 mg/dL — ABNORMAL HIGH (ref 70–99)
Glucose-Capillary: 185 mg/dL — ABNORMAL HIGH (ref 70–99)
Glucose-Capillary: 191 mg/dL — ABNORMAL HIGH (ref 70–99)
Glucose-Capillary: 213 mg/dL — ABNORMAL HIGH (ref 70–99)
Glucose-Capillary: 221 mg/dL — ABNORMAL HIGH (ref 70–99)
Glucose-Capillary: 249 mg/dL — ABNORMAL HIGH (ref 70–99)
Glucose-Capillary: 253 mg/dL — ABNORMAL HIGH (ref 70–99)

## 2010-08-12 LAB — DIFFERENTIAL
Basophils Absolute: 0.1 10*3/uL (ref 0.0–0.1)
Eosinophils Absolute: 0.1 10*3/uL (ref 0.0–0.7)
Eosinophils Relative: 0 % (ref 0–5)
Lymphocytes Relative: 14 % (ref 12–46)
Lymphs Abs: 1.9 10*3/uL (ref 0.7–4.0)
Monocytes Absolute: 0.3 10*3/uL (ref 0.1–1.0)
Monocytes Relative: 13 % — ABNORMAL HIGH (ref 3–12)
Monocytes Relative: 5 % (ref 3–12)
Neutro Abs: 2 10*3/uL (ref 1.7–7.7)
Neutrophils Relative %: 43 % (ref 43–77)

## 2010-08-12 LAB — POCT I-STAT, CHEM 8
Calcium, Ion: 1.12 mmol/L (ref 1.12–1.32)
Chloride: 101 mEq/L (ref 96–112)
Creatinine, Ser: 0.6 mg/dL (ref 0.4–1.2)
Glucose, Bld: 340 mg/dL — ABNORMAL HIGH (ref 70–99)
HCT: 45 % (ref 36.0–46.0)

## 2010-08-12 LAB — CBC
HCT: 42.2 % (ref 36.0–46.0)
Hemoglobin: 14.1 g/dL (ref 12.0–15.0)
Hemoglobin: 14.3 g/dL (ref 12.0–15.0)
MCHC: 33.5 g/dL (ref 30.0–36.0)
RBC: 5.34 MIL/uL — ABNORMAL HIGH (ref 3.87–5.11)
RDW: 15.1 % (ref 11.5–15.5)
RDW: 15.2 % (ref 11.5–15.5)

## 2010-08-12 LAB — URINE MICROSCOPIC-ADD ON

## 2010-08-12 LAB — SEDIMENTATION RATE: Sed Rate: 14 mm/hr (ref 0–22)

## 2010-08-12 LAB — CREATININE, SERUM
Creatinine, Ser: 0.73 mg/dL (ref 0.4–1.2)
GFR calc Af Amer: >60 mL/min (ref >60)

## 2010-08-12 LAB — APTT: aPTT: 30 seconds (ref 24–37)

## 2010-08-12 LAB — LIPID PANEL
Cholesterol: 246 mg/dL — ABNORMAL HIGH (ref 0–200)
LDL Cholesterol: 189 mg/dL — ABNORMAL HIGH (ref 0–99)

## 2010-08-12 LAB — HEMOGLOBIN A1C
Hgb A1c MFr Bld: 10.7 % — ABNORMAL HIGH (ref 4.6–6.1)
Mean Plasma Glucose: 260 mg/dL

## 2010-08-18 ENCOUNTER — Ambulatory Visit: Payer: BC Managed Care – PPO | Admitting: *Deleted

## 2010-08-27 ENCOUNTER — Ambulatory Visit: Payer: BC Managed Care – PPO | Admitting: *Deleted

## 2010-09-14 NOTE — Consult Note (Signed)
[name], [name]             ACCOUNT NO.:  [id number]   MEDICAL RECORD NO.:  [id number]          PATIENT TYPE:  INP   LOCATION:  4703                         FACILITY:  [facility]   PHYSICIAN:  Casimiro Needle L. Reynolds, M.D.DATE OF BIRTH:  [birth year]   DATE OF CONSULTATION:  07/16/2008  DATE OF DISCHARGE:                                 CONSULTATION   REQUESTING PHYSICIAN:  Valerie A. Felicity Coyer, MD   REASON FOR EVALUATION:  Diplopia, stroke versus cranial nerve palsy.   HISTORY OF PRESENT ILLNESS:  This is the initial inpatient consultation  evaluation of this 50 year old woman with a past medical history which  includes hypertension and glucose intolerance.  The patient reports that  she was in her usual state of health 2 days ago, when acutely in the  early evening she had nausea with several bouts of vomiting.  She then  was noted to have significant unsteadiness of gait.  She did not have  any associated focal weakness on one side or the other.  She was brought  to the emergency department and had a CT of the head which was  unremarkable.  In the ED, she says that she developed diplopia.  She was  found to have a markedly elevated blood sugar and given a new diagnosis  of diabetes.  She was sent out on new medications for hypertension and  diabetes, and advised to follow up with her primary physician.  She saw  her primary physician yesterday, who was concerned about her gait  unsteadiness and had her come back into the hospital.  He suspected that  she had a right third nerve palsy.  The patient says that her symptoms  have been stable.  She is walking to the bathroom with assistance, and  does not feel her gait is all that much better today than it was  yesterday.  She has a lot of diplopia even with neutral gaze.  She still  denies any focal numbness, weakness, or slurred speech.  Neurologic  consultation is requested for further considerations.   PAST MEDICAL HISTORY:   Remarkable for hypertension, newly diagnosed  diabetes as above.  She had a history of Guillain-Barr syndrome in 2008  and was hospitalized for 3 months at [facility].   FAMILY/SOCIAL/REVIEW OF SYSTEMS:  Per admission H&P of July 15, 2008,  by Dr. Amador Cunas, which is reviewed.   MEDICATIONS:  Prior to admission, she was taking Glumetza, Azor,  clonidine, and glimepiride.  In the hospital, her medication list  includes Norvasc, Lotensin, glipizide, guaifenesin, sliding scale  insulin, metformin, and intravenous fluids.   PHYSICAL EXAMINATION:  VITAL SIGNS:  Temperature 97.4, blood pressure  124/73, pulse 60, respirations 20, and O2 sat 98% on room air.  GENERAL:  This is a healthy-appearing woman, supine in the hospital bed,  in no distress.  HEAD:  Cranium is normocephalic and atraumatic.  Oropharynx benign.  NECK:  Supple without carotid or supraclavicular bruits.  HEART:  Regular rate and rhythm without murmurs.  NEUROLOGIC:  Mental status:  She is awake and alert.  She is oriented to  time, place,  and person.  Recent and remote memory are intact.  Attention span, concentration, and fund of knowledge are all  appropriate.  Speech is fluent and not dysarthric.  She is able to name  objects, repeat phrases, and follow commands.  Cranial nerves:  Pupils  are equal and reactive.  Examination of extraocular movements reveals  evident skew deviation, bilateral internuclear ophthalmoplegia along  with a vertical gaze paresis on the right.  Visual fields are full to  confrontation.  Hearing is intact to conversational speech.  Facial  sensation is intact.  Face, tongue, and palate move normally and  symmetrically.  Motor:  Normal bulk and tone.  Normal strength in all  tested extremity muscles.  Sensation intact to light touch in all  extremities.  Coordination:  Finger-to-nose and heel-to-shin are  performed well, except for some slight dysmetria on the left upper  extremity.  Gait:  She  rises slowly from a chair.  Her stance is very  wide based and she has difficulty taking steps without assistance.  Reflexes 2+ and symmetric.  Toes are downgoing bilaterally.   LABORATORY REVIEW:  CBC from July 14, 2008, white count 6.6, hemoglobin  14.3, and platelets 207,000.  Chemistries from July 15, 2008,  remarkable for an elevated glucose of 340, otherwise negative.  Urinalysis unremarkable except for glycosuria.  Coags normal.  Sed rate  is 14.  Hemoglobin A1c is 10.7.  MRI of the brain performed earlier  today is personally reviewed.  The study is remarkable for an acute  infarct involving the paramedian portion of the brainstem, read out as  being in the pons, although it is superior in the pons, near the  junction with the mid brain.  This study is also remarkable for a little  bit of scattered white matter hyperintensities, likely due to small  vessel disease.  MRA was not performed.   IMPRESSION:  Acute brainstem stroke with resultant eye movement  abnormalities (bilateral internuclear ophthalmoplegia and right vertical  gaze paresis) and gait/axial ataxia.  Risk factors include hypertension  and diabetes in poor control.   RECOMMENDATIONS:  We will complete her stroke workup including MRA,  carotid and transcranial Dopplers, 2-D echo, and lipid panel.  She needs  physical and occupational therapy evaluations, and likely will need a  brief rehab stay.  I agree with symptomatic patching of the eyes as she  may have long-lasting diplopia, although hopefully this will get better  with time.  Stroke Service to follow.   Thank you for the consultation.       Michael L. Thad Ranger, M.D.  Electronically Signed     MLR/MEDQ  D:  07/16/2008  T:  07/17/2008  Job:  [id number]   cc:   Gordy Savers, MD

## 2010-09-14 NOTE — Discharge Summary (Signed)
[name], [name]             ACCOUNT NO.:  [id number]   MEDICAL RECORD NO.:  [id number]          PATIENT TYPE:  INP   LOCATION:  5511                         FACILITY:  [facility]   PHYSICIAN:  Valerie A. Felicity Coyer, MDDATE OF BIRTH:  12/08/1960   DATE OF ADMISSION:  07/15/2008  DATE OF DISCHARGE:  07/18/2008                               DISCHARGE SUMMARY   DISCHARGE DIAGNOSES:  1. 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  2. New diagnosis, type 2 diabetes, uncontrolled with hemoglobin A1c of      10.7, improved with initiation of oral medications and sliding      scale, continue same during inpatient rehab.  3. Hypertension, uncontrolled but now improved.  4. Dyslipidemia, initiation of statin therapy on this hospitalization.   MEDICATIONS AT THE TIME OF DISCHARGE:  1. Norvasc 10 mg once daily.  2. Lotensin 40 mg once daily.  3. Metformin 500 mg b.i.d.  4. Glucotrol 5 mg p.o. b.i.d.  5. Mucinex 600 mg p.o. b.i.d. p.r.n. cough.  6. Sliding scale insulin during inpatient hospitalization.  7. Aspirin 325 mg daily.  8. Zocor 20 mg p.o. nightly.   DISPOSITION:  The patient is being discharged to inpatient rehab at this  time pending bed availability and insurance approval.  The patient is  medically stable for discharge from acute hospitalization status.   CONDITION ON DISCHARGE:  Medically and hemodynamically stable and ready  for rehab.   HOSPITAL COURSE BY PROBLEM:  1. Diplopia due to acute stroke:  The patient is a pleasant 50-year-      old woman with previously undiagnosed diabetes and poorly      controlled hypertension who had come to the emergency room a day      prior to admission due to complaints of double vision.  She was      found to have an uncontrolled blood pressure, as well as      uncontrolled hyperglycemia, and unaware of being diabetic and      apparently  had no other obvious abnormality identified at this time      in the emergency room and was subsequently sent with close followup      with her primary care physician, which recurred later that day on      July 15, 2008, to address her medical needs of diabetes and      hypertension.  At the time of evaluation by her primary care      physician, it was apparent that the patient had what seemed to be a      right-sided lateral third cranial nerve palsy.  Given her other      risk factors for ischemic abnormality, she underwent an MRI of the      brain, which did confirm a mid brain stroke as the likely cause for      her symptoms.  Subsequently, she was seen in consultation by Neuro      to assist with  medical management.  She was added on aspirin.  She      underwent a 2-D echo and carotids, which were unremarkable, and      fasting lipid profile was checked, which showed a dyslipidemia and      subsequently begun on statin therapy.  She has been seen by PT, OT,      and ST and is felt to be a good candidate for inpatient rehab at      this time and is stable for transfer there pending insurance      approval and bed availability.  We will continue ongoing management      and evaluation for titration of her diabetic management and      hypertension as begun during this hospitalization.  The patient      received education on the need to control this for further risk      factor reduction of future strokes and agrees that she will do so.      She is wearing a patch to cover the right eye at this time to      assist with the aggravation of the porphyria pending further      improvement with time and rehab.  2. Other medical issues:  The patient's other chronic medical issues      are as listed above.  She has been started on oral hypoglycemic      agents and metformin for her diabetes and titration of her      antihypertensives as noted.  This will be on an ongoing basis, but      she is  stable for discharge to rehab at this point.  Please see      hospital chart for full details of hospitalization.      Valerie A. Felicity Coyer, MD  Electronically Signed     VAL/MEDQ  D:  07/18/2008  T:  07/19/2008  Job:  [id number]

## 2010-09-14 NOTE — Assessment & Plan Note (Signed)
A 50 year old female with type 2 diabetes, hypertension, and obesity.  She had posterior paramedian distal pontine infarcts.  Echo was  negative.  Carotid Dopplers negative.  Hemoglobin A1c was 10.7 started  on oral medications.  She was seen by me in postdischarge followup August 26, 2008.  She was initially requiring help from her family when she was  discharged from the rehabilitation part of the [facility], but now is  independent and in fact she was independent about a month ago.  She  followed up with Ophthalmology, who is finding improvement in the  cranial 3 palsy on the right side.  Her only visual deficit is when she  is looking toward the left side and she knows how to compensate by  turning her head.   She has no pain.  Her sleep is good.  She can walk really unlimited  distances.  She can climb steps.  She is driving.  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   REVIEW OF SYSTEMS:  Positive for blood sugar regulation problems,  constipation, and coughing.   Social lives alone.  No drug or alcohol use.   PHYSICAL EXAMINATION:  VITAL SIGNS:  Blood pressure 156/90, pulse 60,  respirations 18, O2 94% on room air.  She is to follow with Dr.  Amador Cunas in regards to the blood pressure.  GENERAL:  Well-developed, well-nourished female, in no acute distress.  Orientation x3.  Affect is alert.  Gait is normal.  EXTREMITIES:  She has normal strength in the upper and lower extremity.  Normal coordination in the upper and lower extremity.  Sensation normal  in the upper and lower extremity.  She has mild abduction weakness in  the right eye for her left side.  Otherwise, normal.  She has no  diplopia on any other directional gaze.   IMPRESSION:  History of pontine infarct only residual deficit is left  lateral gaze with mild diplopia which is compensated by turning her  head.  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  She  really has to look  straight on for this particular activity, and I think  she is safe to do this certainly no coordination deficits or sensation  deficits which would endanger her.  I have released her to work as of  November 04, 2008.  I will see her back in about 2 months to followup, and if  she is doing okay at that time of this year on a p.r.n. basis.      Erick Colace, M.D.  Electronically Signed     AEK/MedQ  D:  10/21/2008 10:10:59  T:  10/21/2008 23:22:21  Job #:  [id number]   cc:   Gordy Savers, MD  [address] [place]  [place] [address]

## 2010-09-14 NOTE — Discharge Summary (Signed)
NAMEJACLYNNE, BALDO             ACCOUNT NO.:  [id number]   MEDICAL RECORD NO.:  [id number]          PATIENT TYPE:  INP   LOCATION:  5508                         FACILITY:  [facility]   PHYSICIAN:  Corwin Levins, MD      DATE OF BIRTH:  1960-12-25   DATE OF ADMISSION:  07/15/2008  DATE OF DISCHARGE:  07/21/2008                               DISCHARGE SUMMARY   DISCHARGE DIAGNOSES:  1. Acute cerebrovascular accident consistent with acute brainstem      stroke.  2. Morbid obesity.  3. Diabetes mellitus.  4. Hypertension.  5. Hyperlipidemia.  6. Right third nerve palsy.  7. History of Guillain-Barre syndrome in 1988.  8. History of asthma.  9. Uterine fibroids.   CONSULTATIONS:  Neurology.   PROCEDURES:  1. MRI of the brain on July 16, 2008, with acute ischemic infarct      involving the posterior right paramedian distal pons.  2. MRA of te neck showing no occlusion, stenosis, or dissection.  3. MRA of the head on July 17, 2008, with no occlusions, dissections,      or aneurysms.  There were some focal areas of mild caliber      irregularity involving the anterior and posterior circulation      representing probable atherosclerotic changes.  4. CT of the head on July 15, 2008, with no acute findings.   HISTORY AND PHYSICAL:  See that documented on July 15, 2008, by the  admitting physician.   HOSPITAL COURSE:  [name] is a 50 year old African American female  who presented with elevated blood sugar, elevated blood pressure, CT of  the head negative, but with double vision as well as blurred vision.  She had not been previously taking her blood pressure medicine or  diabetic medication.  The patient was admitted.  Neurology was  consulted.  MRI and MRA done as above.  PT and OT were consulted.  Stroke Service is followed.  The patient did well with each.  Blood  sugars were improved with oral medications and blood pressure improved  as well and she was started on statin  therapy for hyperlipidemia as  well.  The patient was stable for discharge on July 18, 2008, but there  were no beds available and therefore she was monitored over the weekend  to this date of discharge.  She did well over the weekend without  significant problems with blood pressure and blood sugar control.  There  were no new problems noted.  She does wear the right eye patch.   DISPOSITION:  Okay for transfer to short-term rehabilitation.  She is to  follow up with her primary care physician within 1-2 weeks after that.   DISCHARGE MEDICATIONS:  Same as previously dictated per Dr. Felicity Coyer on  July 19, 1998.  She will receive PT and rehab.      Corwin Levins, MD  Electronically Signed     JWJ/MEDQ  D:  07/21/2008  T:  07/21/2008  Job:  [id number]

## 2010-09-14 NOTE — Assessment & Plan Note (Signed)
HISTORY:  A 50 year old female with type 2 diabetes, hypertension, and  obesity.  She had decreased gait, nausea, vomiting with further workup  revealing an acute ischemic infarcts, posterior paramedian distal pons.  Echocardiogram negative.  Carotid Dopplers negative for ICA stenosis.  Hemoglobin A1c is 10.7 and glipizide and glyburide were started.  She  was discharged to home.  She was having problems with double vision upon  discharge.  She was at a supervision level.  Family has been helping her  initially, but now is pretty much a modified independent with her self-  care and her ambulation.  However, she continues have problems with  double vision.  She is still going to OT working on extraocular motion  strengthening and compensatory skills.   She follows up with her primary care physician for her hypertension.   PHYSICAL EXAMINATION:  VITAL SIGNS:  Her blood pressure 127/67, pulse  53, respirations 18, O2 sat 97% on room air.  GENERAL:  No acute distress.  Orientation x3.  Affect alert.  Gait is  normal.  NEUROLOGIC:  Finger-nose-finger testing is normal.  Strength is normal  upper and lower extremities.  Fine motor is normal.  In vision, she has  an obvious medial rectus palsy on the right side.  She has double vision  looking toward the left side and with extreme lateral gaze on the right  side.   IMPRESSION:  Pontine infarct.  From the strength and balance  perspective, she is doing quite well.  Her main problem is extraocular  muscle palsy, right cranial nerve III in particular.  At this point, I  do not think she can work.  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  She has a neurologic followup in 2 months and look forward to  seeing what it is recommended at that time.  We will fill out the short-  term disability plain form papers and we will hopefully get her back to  work and we will plan to get her back to work once her vision clears up.  I discussed with  the patient in agreement with plan.      Erick Colace, M.D.  Electronically Signed     AEK/MedQ  D:  08/26/2008 13:47:13  T:  08/27/2008 01:03:24  Job #:  [id number]

## 2010-09-14 NOTE — Discharge Summary (Signed)
NAMECLEOPHA, INDELICATO             ACCOUNT NO.:  [id number]   MEDICAL RECORD NO.:  [id number]          PATIENT TYPE:  IPS   LOCATION:  4035                         FACILITY:  [facility]   PHYSICIAN:  Erick Colace, M.D.DATE OF BIRTH:  Sep 27, 1960   DATE OF ADMISSION:  07/21/2008  DATE OF DISCHARGE:  07/25/2008                               DISCHARGE SUMMARY   DISCHARGE DIAGNOSES:  1. Right pontine infarction.  2. Morbid obesity.  3. Hypertension.  4. Non-insulin-dependent diabetes mellitus.  5. Hyperlipidemia.  6. History of asthma.  7. Subcutaneous Lovenox for deep vein thrombosis prophylaxis.   This is a 50 year old female with history of type 2 diabetes mellitus,  hypertension, obesity with BMI of 38 who was admitted on March 16 with  nausea and vomiting x2 days and decreased gait with associated double  vision.  MRI showed acute ischemic infarction posterior right paramedian  distal pons.  MRA negative for occlusion.  Carotid Dopplers negative for  internal carotid artery stenosis.  Echocardiogram with ejection fraction  67% without emboli.  Neurology consulted, placed on aspirin and  subcutaneous Lovenox.  Hemoglobin A1c of 10.7, blood sugar is 240.  Glucophage and glyburide were added.  Cholesterol of 246 with Zocor  added.  She had no chest pain or shortness of breath.  She was seen by  inpatient rehab services, felt to be a good candidate for comprehensive  rehab program.   PAST MEDICAL HISTORY:  See discharge diagnoses.  No alcohol or tobacco.   ALLERGIES:  None.   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  One-level home, two steps to entry.   FUNCTIONAL HISTORY PRIOR TO ADMISSION:  Independent.   FUNCTIONAL STATUS UPON ADMISSION TO REHAB SERVICES:  Independent supine  to sit, minimal guard sit to stand, minimal guard transfers, minimal  assistance for gait.   MEDICATIONS PRIOR TO ADMISSION:  None.   PHYSICAL  EXAMINATION:  VITAL SIGNS:  Blood pressure 151/80, pulse 61,  temperature 97.1, respirations 16.  GENERAL:  This is an alert female, in no acute distress, oriented x3,  moving all extremities.  LUNGS:  Clear to auscultation.  CARDIAC:  Regular rate and rhythm.  ABDOMEN:  Soft, nontender.  Good bowel sounds.  Deep tendon reflexes  were hypoactive.   REHABILITATION HOSPITAL COURSE:  The patient was admitted to inpatient  rehab services with therapies initiated on a 3-hour daily basis  consisting of physical therapy, occupational therapy, and rehabilitation  nursing.  The following issues were addressed during the patient's  rehabilitation stay.  Pertaining Ms. Goodin's right pontine infarction,  she remained on aspirin therapy.  She still had some double vision,  which was improving, as she was using an eye patch.  Hypertension  monitored with Norvasc, Lotensin.  Hydrochlorothiazide was added with  diastolic pressures 67-71.  She denied any chest pain, shortness of  breath, or dizziness.  She did have a hemoglobin A1c of 10.7, on  remission.  Glipizide and Glucophage were added.  Blood sugars 141, 120,  191.  She received full diabetic teaching.  She would follow up with her  primary MD.  She continued on Zocor for hyperlipidemia, which was  without issue.  Subcutaneous Lovenox ongoing for deep vein thrombosis  prophylaxis.  Weekly collaborative interdisciplinary team conferences  were held to discuss the patient's estimated length of stay, any  barriers to discharge, and family teaching.  She was able to sit at side  of the bed with supervision, ambulating with supervision without the use  of assistive device.  Her double vision again continued to improve.  She  was advised no drinking, no driving, no smoking.  Ongoing therapies as  advised.  Admission labs were unremarkable.   DISCHARGE MEDICATIONS:  1. Glipizide 5 mg twice daily.  2. Norvasc 10 mg daily.  3. Aspirin 325 mg  daily.  4. Lotensin 40 mg daily.  5. Zocor 20 mg daily.  6. Glucophage 500 mg twice daily.  7. Hydrochlorothiazide 12.5 mg daily.   Diet was diabetic diet.  She would follow up Dr. Claudette Laws at the  outpatient rehab center as advised.  Dr. Eleonore Chiquito ongoing for  medical management.  She would remain on a diabetic diet.  Check her  blood sugars twice daily.      Mariam Dollar, P.A.      Erick Colace, M.D.  Electronically Signed    DA/MEDQ  D:  07/24/2008  T:  07/24/2008  Job:  [id number]   cc:   Gordy Savers, MD  Pramod P. Pearlean Brownie, MD  Erick Colace, M.D.

## 2010-09-14 NOTE — H&P (Signed)
NAMECABELA, [name]             ACCOUNT NO.:  [id number]   MEDICAL RECORD NO.:  [id number]          PATIENT TYPE:  IPS   LOCATION:  4028                         FACILITY:  [facility]   PHYSICIAN:  Ranelle Oyster, M.D.DATE OF BIRTH:  04/27/1961   DATE OF ADMISSION:  07/21/2008  DATE OF DISCHARGE:                              HISTORY & PHYSICAL   CHIEF COMPLAINT:  Decreased gait and diplopia.   HISTORY OF PRESENT ILLNESS:  This is a 50 year old African American  female with diabetes, hypertension, and obesity who was admitted on  March 16 with nausea and vomiting x2 days and decreased gait and double  vision.  MRI of the brain revealed acute ischemic infarct in a right  paramedian distal pons.  MRA showed no occlusion.  Echocardiogram shows  60-70% ejection fraction.  Neurology recommended aspirin and subcu  Lovenox.  Hemoglobin A1c 10.7 and Glucophage and glyburide were added  for better sugar control.  Zocor was added for hypercholesterolemia.  The patient continued to struggle with mobility and self care.  Rehab  evaluated the patient on March 18 and felt that she could benefit from  an inpatient admission.   REVIEW OF SYSTEMS:  Notable for double vision, numbness, and low-back  pain.  She reports mild constipation.  Otherwise, pertinent positives  are above and full reviews in the written H and P.   PAST MEDICAL HISTORY:  Positive for:  1. Type 2 diabetes.  2. Hypertension.  3. Morbid obesity.  4. History of Guillain-Barre.  5. Right third nerve palsy.  6. Asthma.   She denies alcohol or tobacco use.   FAMILY HISTORY:  Positive for stroke and diabetes.   SOCIAL HISTORY:  The patient lives alone, works for a [employer].  Family can assist her as needed.  She lives in a 1-level house  with 2 steps to enter.   ALLERGIES:  None.   MEDICATIONS:  Azor, clonidine, and Amaryl.   LABORATORY DATA:  Hemoglobin 14.3, white count 6.6, and platelets 207.  Sodium 136,  potassium 4.4, BUN 8, and creatinine 0.6.   PHYSICAL EXAMINATION:  VITAL SIGNS:  Blood pressure is 151/80, pulse is  61, respiratory rate 16, and temperature 97.1.  GENERAL:  The patient is generally pleasant, alert and oriented x3.  HEENT:  Pupils equal, round, and reactive to light.  Ear, nose, and  throat exam is notable for fair dentition and pink moist mucosa.  NECK:  Supple without JVD or lymphadenopathy.  ABDOMEN:  Soft and nontender.  Bowel sounds are positive.  HEART:  Regular rate and rhythm without murmur, rubs, or gallops.  SKIN:  Generally intact throughout.  No signs of breakdown.  NEUROLOGIC:  Cranial nerves II through XII showed right third nerve  palsy.  Reflexes are generally 1+.  Sensation is grossly intact  throughout.  The patient had no gross limb ataxia or any other fine  motor deficits.  Strength is generally 4 to 4+/5 bilateral upper  extremities and 4/5 to 5/5 bilateral lower extremities today.  Judgment,  orientation, memory, and mood were all within functional limits.  POST ADMISSION PHYSICIAN EVALUATION:  1. Functional deficit secondary to right pontine infarct with      truncal/full body ataxia.  The patient tends to fall to right and      left with moving.  She has no focal limb ataxia.  2. The patient is admitted to receive collaborative interdisciplinary      care between the physiatrist, rehab nursing staff, and therapy      team.  3. The patient's level of medical complexity and substantial therapy      needs in context of that medical necessity cannot be provided at a      lesser intensity of care.  4. The patient has experienced substantial functional loss from her      baseline.  Prior to arrival, the patient was independent and      working.  As of most recent therapy evaluation, the patient has      been min assist sit to stand transfers, min assist with wheelchair      transfers and toilet transfers.  She is min assist gait with and       without assistive device as well.  She has been min to mod assist      for most ADLs.  Judging by the patient's physical exam, diagnosis,      and functional history, she has the potential for functional      progress which will result in measurable gains while inpatient      rehab.  These gains will be of substantial and practical use upon      discharge to home in facilitating mobility and self care.  Interim      changes since our rehab consult on March 18 are detailed in the      history of present illness above.  5. Physiatrist will provide 24-hour management of medical needs as      well as oversight of therapy plan/treatment and provide guidance as      appropriate regarding interaction of the two.  Medical problem list      and plan listed below.  6. 24-hour rehab nursing will assist in the management of the      patient's bowel and bladder issues as well as diet, glycemic      control, integration of therapy concepts, techniques, safety, etc.  7. PT will assess and treat for balance, adaptive equipment,      neuromuscular reeducation, safety awareness, functional mobility,      and gait with goals modified independent.  8. OT will assess and treat for upper extremity use, ADLs, adaptive      equipment, safety education, neuromuscular reeducation, and goals      here again modified independent.  9. Case management/social worker will assess and treat for      psychosocial issues and discharge planning.  10.Team conferences will be held weekly to assess progress towards      goals and to determine barriers to discharge.  11.The patient has demonstrated sufficient medical stability and      exercise capacity to tolerate at least 3 hours of therapy per day      at least 5 days per week.  12.Estimated length of stay is 7 days.  Prognosis is good.   MEDICAL PROBLEM LIST AND PLAN:  1. Hypertension:  Continue Norvasc and Lotensin.  We will seek better      control in this crucial post  stroke.  2. Non-insulin chronic diabetes.  We will  follow CBGs closely a.c. and      at bedtime.  Glipizide 5 b.i.d. and Glucophage 500 b.i.d. have been      initiated.  Observe for signs and symptoms of hypoglycemia versus      hyperglycemia.  3. DVT prophylaxis:  Subcu Lovenox daily.  Check platelets on      admission.  4. Stroke prophylaxis with aspirin 325 mg p.o. daily.  5. Diplopia:  Continue eye patch to right eye to assist in balance and      equilibrium.      Ranelle Oyster, M.D.  Electronically Signed     ZTS/MEDQ  D:  07/21/2008  T:  07/22/2008  Job:  [id number]

## 2010-09-17 NOTE — Assessment & Plan Note (Signed)
[name]                            [facility] OFFICE NOTE   RULA, [name]                      MRN:          [id number]  DATE:05/05/2006                            DOB:          March 08, 1961    A 50 year old black female seen today to establish with our practice.  She has been followed by her gynecologist, and did have an exam last  month, apparently with normal blood pressure.  She has remote history of  childhood asthma that has been stable.  She has history of fibroids and  hypermenorrhea.  She was hospitalized for Guillain-Barre syndrome in  1988, and had a 25-month hospital admission.  She is a gravida 1 para 0  abortus 1.  Pregnancy was in 2000.  She has had some outpatient foot  surgery, otherwise her past medical history is unremarkable.   FAMILY HISTORY:  Positive for stomach cancer, hypertension, diabetes.  A  sister may have cardiomyopathy.   EXAMINATION:  Weight 226.  Blood pressure 180/90.  Fundi, ears, nose and throat clear.  NECK:  No bruits.  Did have a healed tracheostomy scar.  CHEST:  Clear.  BREASTS:  Negative.  CARDIOVASCULAR EXAM:  Normal heart sounds.  No murmurs.  ABDOMEN:  Obese, soft and non-tender.  No organomegaly.  EXTREMITIES:  Negative.  No edema.  Peripheral pulses were full.   IMPRESSION:  1. Hypertension.  2. Impaired glucose tolerance.  3. Obesity.  4. Dyslipidemia.   DISPOSITION:  Weight loss, exercise all encouraged.  In view of her  moderately high hypertension, we will place on hydrochlorothiazide 25 mg  daily.  We will recheck in 4 weeks.  We will reassess her blood sugar at  that time and probably institute metformin.  She is quite reluctant to  initiate multiple medications today, although it is also likely that she  will require statin therapy.     Gordy Savers, MD  Electronically Signed    PFK/MedQ  DD: 05/05/2006  DT: 05/05/2006  Job #: [id number]

## 2010-09-17 NOTE — Discharge Summary (Signed)
[name], [name]             ACCOUNT NO.:  [id number]   MEDICAL RECORD NO.:  [id number]          PATIENT TYPE:  INP   LOCATION:  9309                          FACILITY:  [facility]   PHYSICIAN:  Maxie Better, M.D.DATE OF BIRTH:  Dec 09, 1960   DATE OF ADMISSION:  08/02/2006  DATE OF DISCHARGE:  08/04/2006                               DISCHARGE SUMMARY   ADMISSION DIAGNOSES:  1. Menorrhagia.  2. Fibroid uterus.  3. Chronic hypertension.   DISCHARGE DIAGNOSES:  1. Fibroid uterus.  2. Menorrhagia.  3. Adenomyosis.  4. Chronic hypertension.  5. Right paratubal cyst.   PROCEDURES:  1. Exploratory laparotomy.  2. Total abdominal hysterectomy.  3. Myomectomy.  4. Right paratubal cyst removal.   HOSPITAL COURSE:  The patient was admitted as a 50 year old G1, P0-0-1-  0, separated, black female with symptomatic uterine fibroids who was  taken to the operating room, where she underwent the above surgery.  The  patient had an unremarkable postoperative course.  She was continued on  a blood pressure medications postoperatively.  Her CBC on post-op day  #1, showed hemoglobin of 10.7, hematocrit of 32.3, white count 11.7,  platelet count of 237,000.  Her pathology was consistent with a 1,009  gram fibroid uterus with adenomyosis and benign endometrial tissue.  On  post-op day #1, the patient was already tolerating a regular diet and  passing flatus.  She continued to be afebrile throughout her course, and  on post-op day #2, having tolerated a regular diet, passing gas, and no  evidence of infection was deemed well to be discharged home.   DISPOSITION:  Home.   CONDITION:  Stable.   DISCHARGE MEDICATIONS:  1. Benicar 20/12.5 mg one p.o. daily.  2. Tylox 1-2 tablets every 3-4 hours p.r.n. pain.  3. Motrin 800 mg, one p.o. every six to eight hours p.r.n. pain.   DISCHARGE INSTRUCTIONS:  1. Call for temperature greater or equal to 100.4.  2. Nothing per vagina for 4-6 weeks.  3. No heavy lifting or driving for two weeks.  4. No straining with the bowel movements.  5. May shower only.  6. Call for severe abdominal pain, nausea, vomiting, increased      incisional pain, redness or drainage from the incision site,      soaking a pad every hour or more frequently.   FOLLOWUP APPOINTMENTS:  At [facility] on August 07, 2006 for staple  removal and six weeks post-op.      Maxie Better, M.D.  Electronically Signed     [place]/MEDQ  D:  08/04/2006  T:  08/04/2006  Job:  [id number]

## 2010-09-17 NOTE — Op Note (Signed)
Lisa, Mata             ACCOUNT NO.:  [id number]   MEDICAL RECORD NO.:  [id number]          PATIENT TYPE:  INP   LOCATION:  9309                          FACILITY:  [facility]   PHYSICIAN:  Maxie Better, M.D.DATE OF BIRTH:  09/25/1960   DATE OF PROCEDURE:  08/02/2006  DATE OF DISCHARGE:                               OPERATIVE REPORT   PREOPERATIVE DIAGNOSIS:  Menorrhagia, uterine fibroid.   PROCEDURE:  Exploratory laparotomy, myomectomy, total abdominal  hysterectomy, right paratubal cyst removal.   POSTOPERATIVE DIAGNOSIS:  Menorrhagia, pedunculated submucosal  intraligamentous intramural degenerating fibroids, right paratubal cyst.   ANESTHESIA:  General.   SURGEON:  Maxie Better, M.D.   ASSISTANTCordelia Pen A. Rosalio Macadamia, M.D.   PROCEDURE:  Under adequate general anesthesia, the patient is placed in  the supine position.  She was sterilely prepped and draped in the usual  fashion.  Indwelling Foley catheter was sterilely placed.  Marcaine  0.25% is injected along the planned Pfannenstiel skin incision.  Pfannenstiel skin incision is made, carried down to the rectus fascia.  Rectus fascia is opened transversely.  The rectus fascia is then bluntly  and sharply dissected off the rectus muscle in superior and inferior  fashion.  The rectus muscle is split in midline.  The parietal  peritoneum is entered sharply and extended.  On entering the abdominal  cavity, it was noted that the uterus was enlarged to the level of the  umbilicus with multiple fibroids, some which were pedunculated and some  were subserosal, particularly on the right and there was a large about 8  cm left intraligamentous fibroid extended under the base of the broad  ligament on the left.  Several attempts at exteriorizing the uterus was  unsuccessful.  A corkscrew was placed in the fundus aspect of the uterus  in also attempt to exteriorize the uterus and that was also  unsuccessful.  Decision  was then made to identify both round ligaments  which were then doubly suture ligated and severed.  The vesicouterine  peritoneum was opened transversely.  Attempt again at lifting the uterus  was also again limited by omental adhesions to one of the fibroids which  was subsequently cauterization and removal of  the omentum off the  fibroid.  The self-retaining Balfour retractor was then placed.  Bladder  retractor was placed.  Decision was made to start with the hysterectomy  on the right side.  The right fallopian tube had a paratubal cyst and  that was removed.  The ovary on the right was normal.  The left ovary  was normal with a hemorrhagic cyst and was sitting on top of where the  fibroid from the intraligamentous location was seen.  The fallopian tube  on the left was normal.  The posterior leaf of the broad ligament on the  right was then opened.  The utero-ovarian ligament on the right was then  doubly clamped, cut, the pedicle was free tied with 0 Vicryl suture  ligated with 0 Vicryl.  The decision then made to proceed on the left.  Again due to the broad width of the  fibroid uterus and intraligamentous  fibroid, decision was then made to perform removal of several fibroids  in order to facilitate the procedure.  Dilute solution of Pitressin was  injected anteriorly and the fibroid was removed followed by another  fibroid which was noted to be degenerating.  The intraligamentous  fibroid was then allowed to be brought close into the midline and that  was then carefully removed.  In doing so, attempt was then first made to  preserve the left tube and ovary by clamping on that shortened left  uterosacral ligament, clamping and removing as much of the ovarian  attachment prior to proceeding further.  Once the left intraligamentous  fibroid was removed the uterus was then able to be exteriorized.  That  intraligamentous fibroid was also marked degenerating.  Once that was  removed,  the uterus was exteriorized.  The right uterine vessels were  then identified.  They were very prominent and large.  The uterine  vessels were then doubly clamped, cut and suture ligated x2.  The  vesicouterine peritoneum was further developed.  The bladder was further  displaced inferiorly.  At that time on the left the broad ligament  posteriorly was opened and with careful dissection and clamping the  utero-ovarian ligament on the left was subsequently clamped, cut, suture  ligated with 0 Vicryl and free tied with 0 Vicryl.  The left uterine  vessel was then seen, also large.  It was clamped, cut, suture ligated  with 0 Vicryl.  The uterosacral ligaments were noted to be very close to  the midline posteriorly and they were bilaterally clamped, cut and  suture ligated with 0 Vicryl.  At that point the uterus was then  truncated from its cervical attachment.  The cervical stump was then  grasped.  The bladder was then moved further inferiorly and serially  clamped, cutting bilaterally the cardinal ligaments was then performed  until the cervicovaginal junction was felt to have been reached at which  time the cervix was then separated from its vaginal attachment.  On  doing that, it was then noted that there was a small piece of the cervix  still remaining.  At that point the vaginal cuff was opened further to  remove any portion of cervical tissue that was remaining.  The angle of  the vaginal cuff was then secured bilaterally and the vaginal cuff was  reefed  circumferential and then closed with interrupted figure-of-eight  sutures.  The abdomen was then copiously irrigated, suctioned of debris.  Any bleeders were cauterized and/or  free tied.  The pedicles were  inspected.  The pedicles were then suspended to the round ligaments  bilaterally.  The uterosacral ligament was suspended to the left vaginal  cuff. It could not be suspended on the right due to the vaginal angle suture had  already been cut.  Both ureters were palpated deep in the  pelvis.  The appendix was noted to be normal.  The bowels which had been  packed upwardly was inspected.  The omentum was inspected and no  evidence of any problems.  The upper abdomen was then explored after the  packings were removed and normal liver edge, normal kidney palpation.  The abdomen was then copiously irrigated, suctioned.  Good hemostasis  then noted.  All instruments were then removed from the abdomen.  The  parietal peritoneum was now closed.  The rectus fascia was closed with 0  Vicryl x2.  The subcutaneous area was irrigated,  suctioned and  interrupted 2-0 plain sutures was placed and the skin approximated using  Ethicon staples.  Specimen was uterus, cervix, myomas and right  paratubal cyst all sent to pathology.  The weight of the specimen was  1047 grams.  Estimated blood loss was 350 cc.  Intraoperative fluid was  2200 crystalloid.  Urine output was 450 cc clear yellow urine.  Sponge  and instrument counts x2 was correct.  Complication was none.  The  patient tolerated the procedure well, was transferred to the recovery  room in stable condition.      Maxie Better, M.D.  Electronically Signed     /MEDQ  D:  08/02/2006  T:  08/02/2006  Job:  [id number]

## 2010-09-30 ENCOUNTER — Ambulatory Visit: Payer: BC Managed Care – PPO | Admitting: *Deleted

## 2010-11-12 ENCOUNTER — Ambulatory Visit (INDEPENDENT_AMBULATORY_CARE_PROVIDER_SITE_OTHER): Payer: BC Managed Care – PPO | Admitting: Internal Medicine

## 2010-11-12 ENCOUNTER — Encounter: Payer: Self-pay | Admitting: Internal Medicine

## 2010-11-12 DIAGNOSIS — L0293 Carbuncle, unspecified: Secondary | ICD-10-CM

## 2010-11-12 DIAGNOSIS — I10 Essential (primary) hypertension: Secondary | ICD-10-CM

## 2010-11-12 DIAGNOSIS — E119 Type 2 diabetes mellitus without complications: Secondary | ICD-10-CM

## 2010-11-12 DIAGNOSIS — L0292 Furuncle, unspecified: Secondary | ICD-10-CM

## 2010-11-12 MED ORDER — AMOXICILLIN-POT CLAVULANATE 875-125 MG PO TABS: 1.0000 | ORAL_TABLET | Freq: Two times a day (BID) | ORAL | Status: AC

## 2010-11-12 NOTE — Progress Notes (Signed)
  Subjective:    Patient ID: [name], female    DOB: April 25, 1961, 50 y.o.   MRN: [id number]  HPI  50 year old patient who has diabetes who presents with a boil involving her right upper inner thigh. This began 4 days ago and over the past 2 days has been draining spontaneously. She denies any fever or chills. She continues to take Levimir 14 units at bedtime but takes   only rare mealtime insulin and has not taken any insulin today. Her random blood sugar 400.     Review of Systems  Constitutional: Negative for fever, chills and fatigue.  Skin: Positive for rash and wound.       Objective:   Physical Exam  Constitutional: She appears well-developed. No distress.       Blood pressure 120/80 Temperature 98.6  Cardiovascular:       No tachycardia  Skin:       A 4 to 5 cm furuncle was noted in the right upper inner thigh region. There was a central ulcer with slight drainage the area was warm indurated but no fluctuance          Assessment & Plan:   Furuncle right upper inner thigh. This does not appear to be amenable to I&D at this time. We'll place on Augmentin. Warm compresses recommended compliance with her diabetic regimen also strongly encouraged. We'll recheck Monday if there is not prompt clinical improvement  Diabetes mellitus poor control. We'll continue basal insulin. Detailed written instructions for mealtime insulin with sliding scale coverage dispensed

## 2010-11-12 NOTE — Patient Instructions (Addendum)
Limit your sodium (Salt) intake  Take your antibiotic as prescribed until ALL of it is gone, but stop if you develop a rash, swelling, or any side effects of the medication.  Contact our office as soon as possible if  there are side effects of the medication.  Return in one month for follow-up  Call or return to clinic prn if these symptoms worsen or fail to improve as anticipated.

## 2010-11-13 LAB — HEMOGLOBIN A1C: Hgb A1c MFr Bld: 12.1 % — ABNORMAL HIGH (ref <5.7)

## 2010-11-15 NOTE — Progress Notes (Signed)
Quick Note:  SPOKE WITH PT - INFORMED OF LAB AND STRESSED INSULIN AT MEALTIME AND WATCHING DIET CLOSELY ______

## 2010-11-29 ENCOUNTER — Ambulatory Visit (INDEPENDENT_AMBULATORY_CARE_PROVIDER_SITE_OTHER): Payer: BC Managed Care – PPO | Admitting: Internal Medicine

## 2010-11-29 ENCOUNTER — Encounter: Payer: Self-pay | Admitting: Internal Medicine

## 2010-11-29 DIAGNOSIS — S90129A Contusion of unspecified lesser toe(s) without damage to nail, initial encounter: Secondary | ICD-10-CM

## 2010-11-29 DIAGNOSIS — E119 Type 2 diabetes mellitus without complications: Secondary | ICD-10-CM

## 2010-11-29 NOTE — Progress Notes (Signed)
  Subjective:    Patient ID: [name], female    DOB: 1961-04-06, 50 y.o.   MRN: [id number]  HPI  50 year old female who traumatized her left fourth toe after dropping heavy groceries on the foot 5 days ago. She complains of some persistent pain involving the left fourth digit. She has diabetes which has been better controlled. She has been more compliant with her medications. Fasting blood sugar this morning still 200.   Review of Systems  Musculoskeletal: Positive for joint swelling and gait problem.       Objective:   Physical Exam  Constitutional: She appears well-developed and well-nourished. No distress.       Blood pressure 130/80  Musculoskeletal:       Very mild soft tissue swelling involving the left fourth toe. There is no misalignment. The digit was only mildly tender          Assessment & Plan:   No problem-specific assessment & plan notes found for this encounter.  Traumatic injury left fourth toe. Doubt fracture. No indication for x-ray. Will treat symptomatically with Tylenol. We'll wrap the third and fourth digits together for symptom control. Will call if not improved in a couple of days

## 2010-11-29 NOTE — Patient Instructions (Signed)
Call or return to clinic prn if these symptoms worsen or fail to improve as anticipated.

## 2010-12-22 ENCOUNTER — Telehealth: Payer: Self-pay

## 2010-12-22 NOTE — Telephone Encounter (Signed)
Pt would like to know if [employer] is the same as [employer]

## 2010-12-22 NOTE — Telephone Encounter (Signed)
Attempt to call- VM - LMTCB if questions - novolog flexpen,kwikpen should be the same.

## 2011-02-28 ENCOUNTER — Other Ambulatory Visit: Payer: Self-pay | Admitting: Internal Medicine

## 2011-02-28 DIAGNOSIS — Z1231 Encounter for screening mammogram for malignant neoplasm of breast: Secondary | ICD-10-CM

## 2011-03-25 ENCOUNTER — Ambulatory Visit (HOSPITAL_COMMUNITY)
Admission: RE | Admit: 2011-03-25 | Discharge: 2011-03-25 | Disposition: A | Discharge status: Home / Self Care | Payer: BC Managed Care – PPO | Source: Ambulatory Visit | Attending: Internal Medicine | Admitting: Internal Medicine

## 2011-03-25 DIAGNOSIS — Z1231 Encounter for screening mammogram for malignant neoplasm of breast: Secondary | ICD-10-CM | POA: Insufficient documentation

## 2011-05-06 ENCOUNTER — Other Ambulatory Visit: Payer: BC Managed Care – PPO

## 2011-05-13 ENCOUNTER — Ambulatory Visit (INDEPENDENT_AMBULATORY_CARE_PROVIDER_SITE_OTHER): Payer: BC Managed Care – PPO | Admitting: Internal Medicine

## 2011-05-13 ENCOUNTER — Encounter: Payer: Self-pay | Admitting: Internal Medicine

## 2011-05-13 VITALS — BP 160/100 | HR 58 | Temp 97.8°F | Resp 16 | Ht 64.0 in | Wt 211.0 lb

## 2011-05-13 DIAGNOSIS — E119 Type 2 diabetes mellitus without complications: Secondary | ICD-10-CM

## 2011-05-13 DIAGNOSIS — Z0000 Encounter for general adult medical examination without abnormal findings: Secondary | ICD-10-CM

## 2011-05-13 DIAGNOSIS — E785 Hyperlipidemia, unspecified: Secondary | ICD-10-CM

## 2011-05-13 DIAGNOSIS — Z23 Encounter for immunization: Secondary | ICD-10-CM

## 2011-05-13 DIAGNOSIS — I10 Essential (primary) hypertension: Secondary | ICD-10-CM

## 2011-05-13 DIAGNOSIS — I679 Cerebrovascular disease, unspecified: Secondary | ICD-10-CM

## 2011-05-13 LAB — COMPREHENSIVE METABOLIC PANEL
ALT: 19 U/L (ref 0–35)
AST: 18 U/L (ref 0–37)
CO2: 30 mEq/L (ref 19–32)
Calcium: 9.2 mg/dL (ref 8.4–10.5)
Chloride: 104 mEq/L (ref 96–112)
Creatinine, Ser: 0.7 mg/dL (ref 0.4–1.2)
GFR: 109.39 mL/min (ref >60.00)
Potassium: 4.7 mEq/L (ref 3.5–5.1)
Sodium: 140 mEq/L (ref 135–145)
Total Protein: 7.4 g/dL (ref 6.0–8.3)

## 2011-05-13 LAB — CBC WITH DIFFERENTIAL/PLATELET
Basophils Absolute: 0 10*3/uL (ref 0.0–0.1)
Basophils Relative: 0.9 % (ref 0.0–3.0)
Eosinophils Absolute: 0.1 10*3/uL (ref 0.0–0.7)
Hemoglobin: 13.6 g/dL (ref 12.0–15.0)
Lymphocytes Relative: 47.3 % — ABNORMAL HIGH (ref 12.0–46.0)
MCHC: 33.5 g/dL (ref 30.0–36.0)
MCV: 79.5 fl (ref 78.0–100.0)
Monocytes Absolute: 0.6 10*3/uL (ref 0.1–1.0)
Neutro Abs: 1.9 10*3/uL (ref 1.4–7.7)
Neutrophils Relative %: 37.4 % — ABNORMAL LOW (ref 43.0–77.0)
RBC: 5.12 Mil/uL — ABNORMAL HIGH (ref 3.87–5.11)
RDW: 15.3 % — ABNORMAL HIGH (ref 11.5–14.6)

## 2011-05-13 LAB — LIPID PANEL
Cholesterol: 251 mg/dL — ABNORMAL HIGH (ref 0–200)
Total CHOL/HDL Ratio: 6
VLDL: 16 mg/dL (ref 0.0–40.0)

## 2011-05-13 MED ORDER — INSULIN DETEMIR 100 UNIT/ML ~~LOC~~ SOLN: SUBCUTANEOUS | Status: DC

## 2011-05-13 NOTE — Patient Instructions (Addendum)
Limit your sodium (Salt) intake  Please check your blood pressure on a regular basis.  If it is consistently greater than 150/90, please make an office appointment.   Please check your hemoglobin A1c every 3 months    It is important that you exercise regularly, at least 20 minutes 3 to 4 times per week.  If you develop chest pain or shortness of breath seek  medical attention.  You need to lose weight.  Consider a lower calorie diet and regular exercise.  Return in one month for follow-up  Please see your eye doctor yearly to check for diabetic eye damage

## 2011-05-13 NOTE — Progress Notes (Signed)
Subjective:    Patient ID: [name], female    DOB: 1961/04/16, 51 y.o.   MRN: [id number]  HPI  51 year old patient who is in today for a preventive health examination. She has a history of diabetes controlled on basal and medial time insulin. She has not had a hemoglobin A1c since July of last year and was poorly controlled at that time. 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 Remains on aspirin and Plavix. She has treated dyslipidemia controlled on pravastatin. She did have a colonoscopy in 2011. She has had a remote hysterectomy in 2003  Her fasting blood sugars are generally in the 150-200 range on 14 units of Levemir. She states she takes 10 units of NovoLog at lunch and a sliding scale regimen prior to breakfast and her evening meal at home.  Past Medical History  Diagnosis Date  . Abnormality of gait 05/10/2010  . BACK PAIN 11/14/2008  . CEREBROVASCULAR DISEASE 08/05/2008  . DIABETES MELLITUS, TYPE II 07/15/2008  . Diplopia 07/15/2008  . ECZEMA, ATOPIC 04/03/2009  . HYPERLIPIDEMIA 03/06/2009  . HYPERTENSION 07/15/2008  . Vertebral artery stenosis     History   Social History  . Marital Status: Single    Spouse Name: N/A    Number of Children: N/A  . Years of Education: N/A   Occupational History  . Not on file.   Social History Main Topics  . Smoking status: Never Smoker   . Smokeless tobacco: Never Used  . Alcohol Use: No  . Drug Use: No  . Sexually Active: Not on file   Other Topics Concern  . Not on file   Social History Narrative  . No narrative on file    Past Surgical History  Procedure Date  . Abdominal hysterectomy   . Foot surgery     No family history on file.  No Known Allergies  Current Outpatient Prescriptions on File Prior to Visit  Medication Sig Dispense Refill  . amLODipine (NORVASC) 10 MG tablet Take 1 tablet (10 mg total) by mouth daily.  90 tablet   6  . aspirin 81 MG tablet Take 81 mg by mouth daily.        . chlorthalidone (HYGROTON) 25 MG tablet Take 1 tablet (25 mg total) by mouth daily.  90 tablet  6  . clopidogrel (PLAVIX) 75 MG tablet Take 1 tablet (75 mg total) by mouth daily.  90 tablet  6  . glucose blood (ONE TOUCH ULTRA TEST) test strip Use as instructed  100 each  6  . insulin aspart (NOVOLOG FLEXPEN) 100 UNIT/ML injection Sliding scale - Blood sugars <70  - 0 units , 71-150 -6  units , 151-200 - 10  units, 201-250 - 14 units , 251-300 - 8 units  10 mL  6  . insulin detemir (LEVEMIR FLEXPEN) 100 UNIT/ML injection Inject 14 Units into the skin at bedtime.  10 mL  6  . Insulin Pen Needle (BD PEN NEEDLE NANO U/F) 32G X 4 MM MISC 1 each by Does not apply route 4 (four) times daily -  with meals and at bedtime.        . Lancets (ONETOUCH ULTRASOFT) lancets Use as instructed  100 each  6  . lisinopril (PRINIVIL,ZESTRIL) 20 MG tablet Take 1 tablet (20 mg total) by mouth 2 (two) times daily.  90 tablet  6  . metFORMIN (GLUCOPHAGE) 1000 MG tablet  Take 1 tablet (1,000 mg total) by mouth 2 (two) times daily with a meal.  90 tablet  6  . pantoprazole (PROTONIX) 40 MG tablet Take 1 tablet (40 mg total) by mouth daily.  90 tablet  6  . pravastatin (PRAVACHOL) 40 MG tablet Take 1 tablet (40 mg total) by mouth daily.  90 tablet  6  . traMADol (ULTRAM) 50 MG tablet Take 1 tablet (50 mg total) by mouth every 6 (six) hours as needed.  30 tablet  6  . triamcinolone (KENALOG) 0.5 % ointment Apply topically 2 (two) times daily.  30 g  6    BP 160/100  Pulse 58  Temp(Src) 97.8 F (36.6 C) (Oral)  Resp 16  Ht 5\' 4"  (1.626 m)  Wt 211 lb (95.709 kg)  BMI 36.22 kg/m2  SpO2 98%        Review of Systems  Constitutional: Negative for fever, appetite change, fatigue and unexpected weight change.  HENT: Negative for hearing loss, ear pain, nosebleeds, congestion, sore throat, mouth sores, trouble swallowing, neck stiffness, dental problem, voice  change, sinus pressure and tinnitus.   Eyes: Negative for photophobia, pain, redness and visual disturbance.  Respiratory: Negative for cough, chest tightness and shortness of breath.   Cardiovascular: Negative for chest pain, palpitations and leg swelling.  Gastrointestinal: Negative for nausea, vomiting, abdominal pain, diarrhea, constipation, blood in stool, abdominal distention and rectal pain.  Genitourinary: Negative for dysuria, urgency, frequency, hematuria, flank pain, vaginal bleeding, vaginal discharge, difficulty urinating, genital sores, vaginal pain, menstrual problem and pelvic pain.  Musculoskeletal: Negative for back pain and arthralgias.  Skin: Negative for rash.  Neurological: Negative for dizziness, syncope, speech difficulty, weakness, light-headedness, numbness and headaches.  Hematological: Negative for adenopathy. Does not bruise/bleed easily.  Psychiatric/Behavioral: Negative for suicidal ideas, behavioral problems, self-injury, dysphoric mood and agitation. The patient is not nervous/anxious.        Objective:   Physical Exam  Constitutional: She is oriented to person, place, and time. She appears well-developed and well-nourished.  HENT:  Head: Normocephalic.  Right Ear: External ear normal.  Left Ear: External ear normal.  Mouth/Throat: Oropharynx is clear and moist.  Eyes: Conjunctivae and EOM are normal. Pupils are equal, round, and reactive to light.  Neck: Normal range of motion. Neck supple. No thyromegaly present.  Cardiovascular: Normal rate, regular rhythm, normal heart sounds and intact distal pulses.   Pulmonary/Chest: Effort normal and breath sounds normal.  Abdominal: Soft. Bowel sounds are normal. She exhibits no mass. There is no tenderness.  Musculoskeletal: Normal range of motion.  Lymphadenopathy:    She has no cervical adenopathy.  Neurological: She is alert and oriented to person, place, and time.  Skin: Skin is warm and dry. No rash  noted.  Psychiatric: She has a normal mood and affect. Her behavior is normal.          Assessment & Plan:   Preventive health examination Diabetes mellitus poor control. We'll increase Levemir to 20 units. We'll check a hemoglobin A1c today and probably be more aggressive with mealtime insulin Hypertension not well-controlled today she states that she did not take any medications today. Compliance stressed we'll recheck in one month Cerebrovascular disease

## 2011-08-13 ENCOUNTER — Other Ambulatory Visit: Payer: Self-pay | Admitting: Internal Medicine

## 2011-08-15 ENCOUNTER — Other Ambulatory Visit: Payer: Self-pay | Admitting: *Deleted

## 2011-08-15 NOTE — Telephone Encounter (Signed)
This was already done this AM - 8 RF request came in via esripts

## 2011-08-15 NOTE — Telephone Encounter (Signed)
Patient would like a refill on all of her meds sent to [facility].

## 2011-08-19 ENCOUNTER — Ambulatory Visit: Payer: BC Managed Care – PPO | Admitting: Internal Medicine

## 2011-09-09 ENCOUNTER — Telehealth: Payer: Self-pay | Admitting: Internal Medicine

## 2011-09-09 NOTE — Telephone Encounter (Signed)
Pt would like samples of novolog flex pen. Pt ins has changed. Pt is aware MD out office until Monday.

## 2011-09-09 NOTE — Telephone Encounter (Signed)
Pt is aware a samples if available

## 2011-09-09 NOTE — Telephone Encounter (Signed)
1 sample available for pick up. 

## 2011-09-24 ENCOUNTER — Emergency Department (HOSPITAL_COMMUNITY)
Admission: EM | Admit: 2011-09-24 | Discharge: 2011-09-24 | Disposition: A | Discharge status: Home / Self Care | Payer: BC Managed Care – PPO | Attending: Emergency Medicine | Admitting: Emergency Medicine

## 2011-09-24 ENCOUNTER — Encounter (HOSPITAL_COMMUNITY): Payer: Self-pay | Admitting: *Deleted

## 2011-09-24 DIAGNOSIS — E119 Type 2 diabetes mellitus without complications: Secondary | ICD-10-CM | POA: Insufficient documentation

## 2011-09-24 DIAGNOSIS — Z794 Long term (current) use of insulin: Secondary | ICD-10-CM | POA: Insufficient documentation

## 2011-09-24 DIAGNOSIS — W19XXXA Unspecified fall, initial encounter: Secondary | ICD-10-CM | POA: Insufficient documentation

## 2011-09-24 DIAGNOSIS — Y9229 Other specified public building as the place of occurrence of the external cause: Secondary | ICD-10-CM | POA: Insufficient documentation

## 2011-09-24 DIAGNOSIS — Y998 Other external cause status: Secondary | ICD-10-CM | POA: Insufficient documentation

## 2011-09-24 DIAGNOSIS — Y9301 Activity, walking, marching and hiking: Secondary | ICD-10-CM | POA: Insufficient documentation

## 2011-09-24 DIAGNOSIS — Z8673 Personal history of transient ischemic attack (TIA), and cerebral infarction without residual deficits: Secondary | ICD-10-CM | POA: Insufficient documentation

## 2011-09-24 DIAGNOSIS — Z79899 Other long term (current) drug therapy: Secondary | ICD-10-CM | POA: Insufficient documentation

## 2011-09-24 DIAGNOSIS — S0081XA Abrasion of other part of head, initial encounter: Secondary | ICD-10-CM

## 2011-09-24 DIAGNOSIS — E785 Hyperlipidemia, unspecified: Secondary | ICD-10-CM | POA: Insufficient documentation

## 2011-09-24 DIAGNOSIS — IMO0002 Reserved for concepts with insufficient information to code with codable children: Secondary | ICD-10-CM | POA: Insufficient documentation

## 2011-09-24 DIAGNOSIS — I10 Essential (primary) hypertension: Secondary | ICD-10-CM | POA: Insufficient documentation

## 2011-09-24 HISTORY — DX: Cerebral infarction, unspecified: I63.9

## 2011-09-24 MED ORDER — TRAMADOL HCL 50 MG PO TABS: 50.0000 mg | ORAL_TABLET | Freq: Four times a day (QID) | ORAL | Status: AC | PRN

## 2011-09-24 NOTE — ED Notes (Signed)
Pt reports falling while going into [facility] after tripping over a nail. Pt reports right knee pain with minor abrasions noted to both knees as well as abrasion noted under left eye. Pt denies LOC.

## 2011-09-24 NOTE — Discharge Instructions (Signed)
Abrasions An abrasion is a scraped area on the skin. Abrasions do not go through all layers of the skin.  HOME CARE  Change any bandages (dressings) as told by your doctor. If the bandage sticks, soak it off with warm, soapy water. Change the bandage if it gets wet, dirty, or starts to smell.   Wash the area with soap and water twice a day. Rinse off the soap. Pat the area dry with a clean towel.   Look at the injured area for signs of infection. Infection signs include redness, puffiness (swelling), tenderness, or yellowish white fluid (pus) coming from the wound.   Apply medicated cream as told by your doctor.   Only take medicine as told by your doctor.   Follow up with your doctor as told.  GET HELP RIGHT AWAY IF:   You have more pain in your wound.   You have redness, puffiness (swelling), or tenderness around your wound.   You have yellowish white fluid (pus) coming from your wound.   You have a fever.   A bad smell is coming from the wound or bandage.  MAKE SURE YOU:   Understand these instructions.   Will watch your condition.   Will get help right away if you are not doing well or get worse.  Document Released: 10/05/2007 Document Revised: 04/07/2011 Document Reviewed: 03/22/2011 [employer] Patient Information 2012 [employer].   Abrasions An abrasion is a scraped area on the skin. Abrasions do not go through all layers of the skin.  HOME CARE  Change any bandages (dressings) as told by your doctor. If the bandage sticks, soak it off with warm, soapy water. Change the bandage if it gets wet, dirty, or starts to smell.   Wash the area with soap and water twice a day. Rinse off the soap. Pat the area dry with a clean towel.   Look at the injured area for signs of infection. Infection signs include redness, puffiness (swelling), tenderness, or yellowish white fluid (pus) coming from the wound.   Apply medicated cream as told by your doctor.   Only take medicine as told by your doctor.   Follow up with your doctor as told.  GET HELP RIGHT AWAY IF:   You have more pain in your wound.   You have redness, puffiness (swelling), or tenderness around your wound.   You have yellowish white fluid (pus) coming from your wound.   You have a fever.   A bad smell is coming from the wound or bandage.  MAKE SURE YOU:   Understand these instructions.   Will watch your condition.   Will get help right away if you are not doing well or get worse.  Document Released: 10/05/2007 Document Revised: 04/07/2011 Document Reviewed: 03/22/2011 [facility] Patient Information 2012 [place], [place].

## 2011-09-24 NOTE — ED Provider Notes (Signed)
History     CSN: [id number]  Arrival date & time 09/24/11  1611   First MD Initiated Contact with Patient 09/24/11 1752      Chief Complaint  Patient presents with  . Fall  . Facial Pain    under left eye  . Knee Pain    right    (Consider location/radiation/quality/duration/timing/severity/associated sxs/prior treatment) HPI  51 year old female presents to ED with a chief complaints of fall. Patient states she was walking into [facility] when her foot caught in a crevice causing her to fell forward.  Patient states both of her knee hits the pavement as well as her face. She was able to break the fall with both hands. She denies loss of consciousness. Patient noticed an abrasion under her left eye as well as pain to both knees. States she was able to ambulate afterward. She denies any other injury. Patient denies any precipitating symptoms prior to fall. She denies headache, neck pain, chest pain, shortness of breath, abdominal pain, numbness or weakness.  Past Medical History  Diagnosis Date  . Abnormality of gait 05/10/2010  . BACK PAIN 11/14/2008  . CEREBROVASCULAR DISEASE 08/05/2008  . DIABETES MELLITUS, TYPE II 07/15/2008  . Diplopia 07/15/2008  . ECZEMA, ATOPIC 04/03/2009  . HYPERLIPIDEMIA 03/06/2009  . HYPERTENSION 07/15/2008  . Vertebral artery stenosis   . Stroke     Past Surgical History  Procedure Date  . Abdominal hysterectomy   . Foot surgery     History reviewed. No pertinent family history.  History  Substance Use Topics  . Smoking status: Never Smoker   . Smokeless tobacco: Never Used  . Alcohol Use: No    OB History    Grav Para Term Preterm Abortions TAB SAB Ect Mult Living                  Review of Systems  All other systems reviewed and are negative.    Allergies  Review of patient's allergies indicates no known allergies.  Home Medications   Current Outpatient Rx  Name Route Sig Dispense Refill  . AMLODIPINE BESYLATE 10 MG PO TABS   TAKE ONE TABLET BY MOUTH EVERY DAY 90 tablet 3  . CHLORTHALIDONE 25 MG PO TABS  TAKE ONE TABLET BY MOUTH EVERY DAY 90 tablet 3  . INSULIN DETEMIR 100 UNIT/ML [place] SOLN  20 units at bedtime 10 mL 6  . LISINOPRIL 20 MG PO TABS      . METFORMIN HCL 1000 MG PO TABS  TAKE ONE TABLET BY MOUTH TWICE DAILY WITH A MEAL 180 tablet 3  . NOVOLOG FLEXPEN 100 UNIT/ML [place] SOLN  SLIDING SCALE- BLOOD SUGARS (<70-0 UNITS)--(71-150 -6 UNITS)--(151-200- 10 UNITS)--(201-250- 14 UNITS)--(251-300- 18 UNITS) 15 mL 5  . PLAVIX 75 MG PO TABS  TAKE ONE TABLET BY MOUTH EVERY DAY 90 each 3  . TRIAMCINOLONE ACETONIDE 0.5 % EX OINT  APPLY  TWICE DAILY 30 g 5  . GLUCOSE BLOOD VI STRP  Use as instructed 100 each 6  . INSULIN PEN NEEDLE 32G X 4 MM MISC Does not apply 1 each by Does not apply route 4 (four) times daily -  with meals and at bedtime.      Letta Pate ULTRASOFT LANCETS MISC  Use as instructed 100 each 6    BP 200/113  Pulse 67  Temp(Src) 98.3 F (36.8 C) (Oral)  Resp 18  SpO2 100%  Physical Exam  Nursing note and vitals reviewed. Constitutional: She appears  well-developed and well-nourished. No distress.  HENT:  Head: Normocephalic.  Right Ear: External ear normal.  Mouth/Throat: Oropharynx is clear and moist. No oropharyngeal exudate.       Mild abrasion noted inferior to left eye. No ecchymosis, no bleeding. No deformity noted. No midface tenderness. No malocclusion  Eyes: Conjunctivae and EOM are normal. Pupils are equal, round, and reactive to light.  Neck: Normal range of motion. Neck supple.  Pulmonary/Chest: She exhibits no tenderness.  Abdominal: There is no tenderness.  Musculoskeletal: Normal range of motion.       Mild abrasion noted to answer aspects of both knee. No deformity. Normal range of motion. Mild tenderness on palpation. No bleeding. Patient able to ambulate without difficulty.  Neurological: She is alert.  Skin: Skin is warm.  Psychiatric: She has a normal mood and affect.    ED  Course  Procedures (including critical care time)  Labs Reviewed - No data to display No results found.   No diagnosis found.    MDM  Patient is here for evaluation of a mechanical fall. Mild abrasion noted to the face, and both knee. Low suspicion for acute fracture or dislocation. Patient able to ambulate without difficulty. Reassurance given. Rice therapy discussed. No further imaging needed at this time. Patient voiced understanding and agrees with plan.`    6:28 PM Pt has hx of HTN.  Sts she did not take her meds this AM.  Her BP is elevated.  Pt denies any sxs suggestive of endorgan damage.  Pt agrees to take her BP meds once discharge.  Pt agrees to f/u with PCP for further eval of her HTN.    Fayrene Helper, PA-C 09/24/11 1805  Fayrene Helper, PA-C 09/24/11 1829

## 2011-09-24 NOTE — ED Notes (Signed)
PA Fayrene Helper aware of [name] blood pressure and says it is okay to discharge. [name] has not taken medication today and is aware she should have her PCP follow up on her blood pressure.

## 2011-09-24 NOTE — ED Provider Notes (Signed)
Medical screening examination/treatment/procedure(s) were performed by non-physician practitioner and as supervising physician I was immediately available for consultation/collaboration.   Antwoine Zorn, MD 09/24/11 2135 

## 2011-10-10 ENCOUNTER — Telehealth: Payer: Self-pay | Admitting: Family Medicine

## 2011-10-10 ENCOUNTER — Ambulatory Visit (INDEPENDENT_AMBULATORY_CARE_PROVIDER_SITE_OTHER): Payer: BC Managed Care – PPO | Admitting: Internal Medicine

## 2011-10-10 ENCOUNTER — Encounter: Payer: Self-pay | Admitting: Internal Medicine

## 2011-10-10 VITALS — BP 110/70 | Temp 98.1°F | Wt 204.0 lb

## 2011-10-10 DIAGNOSIS — E785 Hyperlipidemia, unspecified: Secondary | ICD-10-CM

## 2011-10-10 DIAGNOSIS — I679 Cerebrovascular disease, unspecified: Secondary | ICD-10-CM

## 2011-10-10 DIAGNOSIS — I10 Essential (primary) hypertension: Secondary | ICD-10-CM

## 2011-10-10 DIAGNOSIS — E119 Type 2 diabetes mellitus without complications: Secondary | ICD-10-CM

## 2011-10-10 MED ORDER — METFORMIN HCL 1000 MG PO TABS: 1000.0000 mg | ORAL_TABLET | Freq: Two times a day (BID) | ORAL | Status: DC

## 2011-10-10 MED ORDER — GLUCOSE BLOOD VI STRP: ORAL_STRIP | Status: DC

## 2011-10-10 MED ORDER — AMLODIPINE BESYLATE 10 MG PO TABS: 10.0000 mg | ORAL_TABLET | Freq: Every day | ORAL | Status: DC

## 2011-10-10 MED ORDER — ONETOUCH ULTRASOFT LANCETS MISC: Status: DC

## 2011-10-10 MED ORDER — PRAVASTATIN SODIUM 40 MG PO TABS: 40.0000 mg | ORAL_TABLET | Freq: Every day | ORAL | Status: DC

## 2011-10-10 MED ORDER — LISINOPRIL 40 MG PO TABS: 40.0000 mg | ORAL_TABLET | Freq: Every day | ORAL | Status: DC

## 2011-10-10 MED ORDER — INSULIN ASPART 100 UNIT/ML ~~LOC~~ SOLN: 14.0000 [IU] | Freq: Three times a day (TID) | SUBCUTANEOUS | Status: DC

## 2011-10-10 MED ORDER — INSULIN DETEMIR 100 UNIT/ML ~~LOC~~ SOLN: SUBCUTANEOUS | Status: DC

## 2011-10-10 MED ORDER — CHLORTHALIDONE 25 MG PO TABS: 25.0000 mg | ORAL_TABLET | Freq: Every day | ORAL | Status: DC

## 2011-10-10 MED ORDER — INSULIN PEN NEEDLE 32G X 4 MM MISC: 1.0000 | Freq: Three times a day (TID) | Status: DC

## 2011-10-10 NOTE — Telephone Encounter (Signed)
Pulled from Triage vmail. Pt called at 9:24. States she had dizzy spell yesterday with feeling of ears being "full" - concerned about high BP. Per nurse's advisement, I sched pt for 4:00 today and called pt and LMOM re appt.

## 2011-10-10 NOTE — Progress Notes (Signed)
  Subjective:    Patient ID: [name], female    DOB: 26-Jul-1960, 51 y.o.   MRN: [id number]  HPI  BP Readings from Last 3 Encounters:  10/10/11 110/70  09/24/11 216/113  05/13/11 51/69    89 -year-old patient who has a history of type 2 diabetes hypertension and dyslipidemia. She has not been compliant with insulin therapy and no longer is taking bedtime maintenance insulin. She had been on a sliding scale of NovoLog but states that she takes either 12-14 units. Complaints today include some dizziness which is nonspecific. She went to the ED 2 weeks ago blood pressure was quite high and was reportedly 181/118 at [facility] yesterday She states that she has been compliant with her blood pressure medication  Review of Systems  Constitutional: Negative.   HENT: Negative for hearing loss, congestion, sore throat, rhinorrhea, dental problem, sinus pressure and tinnitus.   Eyes: Negative for pain, discharge and visual disturbance.  Respiratory: Negative for cough and shortness of breath.   Cardiovascular: Negative for chest pain, palpitations and leg swelling.  Gastrointestinal: Negative for nausea, vomiting, abdominal pain, diarrhea, constipation, blood in stool and abdominal distention.  Genitourinary: Negative for dysuria, urgency, frequency, hematuria, flank pain, vaginal bleeding, vaginal discharge, difficulty urinating, vaginal pain and pelvic pain.  Musculoskeletal: Negative for joint swelling, arthralgias and gait problem.  Skin: Negative for rash.  Neurological: Positive for light-headedness. Negative for dizziness, syncope, speech difficulty, weakness, numbness and headaches.  Hematological: Negative for adenopathy.  Psychiatric/Behavioral: Negative for behavioral problems, dysphoric mood and agitation. The patient is not nervous/anxious.        Objective:   Physical Exam  Constitutional: She is oriented to person, place, and time. She appears well-developed and  well-nourished.       Repeat blood pressure 150/90  HENT:  Head: Normocephalic.  Right Ear: External ear normal.  Left Ear: External ear normal.  Mouth/Throat: Oropharynx is clear and moist.  Eyes: Conjunctivae and EOM are normal. Pupils are equal, round, and reactive to light.  Neck: Normal range of motion. Neck supple. No thyromegaly present.  Cardiovascular: Normal rate, regular rhythm, normal heart sounds and intact distal pulses.   Pulmonary/Chest: Effort normal and breath sounds normal.  Abdominal: Soft. Bowel sounds are normal. She exhibits no mass. There is no tenderness.  Musculoskeletal: Normal range of motion.  Lymphadenopathy:    She has no cervical adenopathy.  Neurological: She is alert and oriented to person, place, and time.  Skin: Skin is warm and dry. No rash noted.  Psychiatric: She has a normal mood and affect. Her behavior is normal.          Assessment & Plan:   Diabetes mellitus. Poor control will resume levemir. In effort to simplify mealtime insulin we'll change to 14 units prior to each meal Hypertension we'll increase lisinopril to 40 mg daily will continue chlorthalidone and amlodipine 10. Recheck 1 month Cerebral vascular disease Dyslipidemia. Continue pravastatin 40

## 2011-10-10 NOTE — Patient Instructions (Signed)
Limit your sodium (Salt) intake   Please check your hemoglobin A1c every 3 months    It is important that you exercise regularly, at least 20 minutes 3 to 4 times per week.  If you develop chest pain or shortness of breath seek  medical attention.   

## 2011-10-17 ENCOUNTER — Telehealth: Payer: Self-pay | Admitting: Internal Medicine

## 2011-10-17 NOTE — Telephone Encounter (Signed)
Caller: Lachae/Patient; PCP: Eleonore Chiquito; CB#: [phone];  Call regarding Blurred Vision, Dizziness; Intermittent dizziness and ear congestion x 1 wk, blurred vision x 1 mth. No dizziness today. Appt sched for 10/18/11 @ 1515 with Dr. Amador Cunas. Ear:Sx/Eye: Vision Change Protocols.

## 2011-10-18 ENCOUNTER — Encounter: Payer: Self-pay | Admitting: Internal Medicine

## 2011-10-18 ENCOUNTER — Ambulatory Visit (INDEPENDENT_AMBULATORY_CARE_PROVIDER_SITE_OTHER): Payer: BC Managed Care – PPO | Admitting: Internal Medicine

## 2011-10-18 VITALS — BP 122/80 | Temp 98.3°F | Wt 202.0 lb

## 2011-10-18 DIAGNOSIS — E119 Type 2 diabetes mellitus without complications: Secondary | ICD-10-CM

## 2011-10-18 DIAGNOSIS — I10 Essential (primary) hypertension: Secondary | ICD-10-CM

## 2011-10-18 MED ORDER — INSULIN DETEMIR 100 UNIT/ML ~~LOC~~ SOLN: SUBCUTANEOUS | Status: DC

## 2011-10-18 MED ORDER — INSULIN ASPART 100 UNIT/ML ~~LOC~~ SOLN: SUBCUTANEOUS | Status: DC

## 2011-10-18 NOTE — Patient Instructions (Signed)
Please check your hemoglobin A1c every 3 months  Return visit 4 weeks

## 2011-10-18 NOTE — Progress Notes (Signed)
  Subjective:    Patient ID: [name], female    DOB: [birth year], 51 y.o.   MRN: [id number]  HPI  51 year old patient who has a history of type 2 diabetes. She was seen here 8 days ago and insulin therapy up titrated. She now is taking basal insulin 20 units at bedtime as well as 14 units before each meal. She claims compliance. Her chief complaint today is blurred vision about 5 days duration. She has seen her eye doctor within the past week or so. Fasting blood sugars are consistently over 200. Vision is improved with use of her glasses.   Review of Systems  Constitutional: Negative.   HENT: Negative for hearing loss, congestion, sore throat, rhinorrhea, dental problem, sinus pressure and tinnitus.   Eyes: Positive for visual disturbance. Negative for pain and discharge.  Respiratory: Negative for cough and shortness of breath.   Cardiovascular: Negative for chest pain, palpitations and leg swelling.  Gastrointestinal: Negative for nausea, vomiting, abdominal pain, diarrhea, constipation, blood in stool and abdominal distention.  Genitourinary: Negative for dysuria, urgency, frequency, hematuria, flank pain, vaginal bleeding, vaginal discharge, difficulty urinating, vaginal pain and pelvic pain.  Musculoskeletal: Negative for joint swelling, arthralgias and gait problem.  Skin: Negative for rash.  Neurological: Negative for dizziness, syncope, speech difficulty, weakness, numbness and headaches.  Hematological: Negative for adenopathy.  Psychiatric/Behavioral: Negative for behavioral problems, dysphoric mood and agitation. The patient is not nervous/anxious.        Objective:   Physical Exam  Constitutional: She is oriented to person, place, and time. She appears well-developed and well-nourished.  HENT:  Head: Normocephalic.  Right Ear: External ear normal.  Left Ear: External ear normal.  Mouth/Throat: Oropharynx is clear and moist.  Eyes: Conjunctivae and EOM are normal.  Pupils are equal, round, and reactive to light.  Neck: Normal range of motion. Neck supple. No thyromegaly present.  Cardiovascular: Normal rate, regular rhythm, normal heart sounds and intact distal pulses.   Pulmonary/Chest: Effort normal and breath sounds normal.  Abdominal: Soft. Bowel sounds are normal. She exhibits no mass. There is no tenderness.  Musculoskeletal: Normal range of motion.  Lymphadenopathy:    She has no cervical adenopathy.  Neurological: She is alert and oriented to person, place, and time.  Skin: Skin is warm and dry. No rash noted.  Psychiatric: She has a normal mood and affect. Her behavior is normal.          Assessment & Plan:   Diabetes mellitus. Uncontrolled fasting blood sugars are consistently over 200. A postprandial blood sugar presently is 197. We'll uptitrate both basal and mealtime insulin

## 2011-10-21 ENCOUNTER — Telehealth: Payer: Self-pay | Admitting: Internal Medicine

## 2011-10-21 NOTE — Telephone Encounter (Signed)
Pt is wondering if there are any samples avail for insulin detemir (LEVEMIR) 100 UNIT/ML injection. Too expensive for pt. If no samples avail, does Dr Amador Cunas know of a pt assistance program that can help pt get insulin cheaper?

## 2011-10-21 NOTE — Telephone Encounter (Signed)
Pt came by office - no samples avilb. Gave connection to care phone.

## 2011-10-21 NOTE — Telephone Encounter (Signed)
We dont have anything avilb. For samples at this tim e- "Connection to Care" is a pt assist program - , she can call [phone]  Please call and inform

## 2012-02-24 ENCOUNTER — Telehealth: Payer: Self-pay | Admitting: Internal Medicine

## 2012-02-24 NOTE — Telephone Encounter (Signed)
Caller: [name]/Patient; Patient Name: [name]; PCP: Eleonore Chiquito [facility]); Best Callback Phone Number: [phone].  Patient calling about reflux.  States has been treated in the past for this, and is having recurrence of symptoms.  Onset 2 weeks ago of increased symptoms.  Was treated in 2012 with protonix 40mg  daily.  Per heartburn protocol, emergent symptoms denied; advised appt within 72 hours.  Appt offered for 02/28/12; declines.  States will wait for Well visit scheduled 03/09/12 unless Dr. Amador Cunas will consider writing Rx before that visit.  Info to office for provider review/Rx/callback.

## 2012-02-27 MED ORDER — PANTOPRAZOLE SODIUM 40 MG PO TBEC: 40.0000 mg | DELAYED_RELEASE_TABLET | Freq: Every day | ORAL | Status: DC

## 2012-02-27 NOTE — Telephone Encounter (Signed)
Generic protonic 40 mg #30

## 2012-02-27 NOTE — Telephone Encounter (Signed)
Called patient left ms. Via vm.

## 2012-03-02 ENCOUNTER — Other Ambulatory Visit (INDEPENDENT_AMBULATORY_CARE_PROVIDER_SITE_OTHER): Payer: BC Managed Care – PPO

## 2012-03-02 DIAGNOSIS — Z0000 Encounter for general adult medical examination without abnormal findings: Secondary | ICD-10-CM

## 2012-03-02 LAB — BASIC METABOLIC PANEL
GFR: 105.66 mL/min (ref >60.00)
Potassium: 4 mEq/L (ref 3.5–5.1)
Sodium: 137 mEq/L (ref 135–145)

## 2012-03-02 LAB — POCT URINALYSIS DIPSTICK
Blood, UA: NEGATIVE
Protein, UA: NEGATIVE
Spec Grav, UA: 1.02
Urobilinogen, UA: 0.2
pH, UA: 5

## 2012-03-02 LAB — HEPATIC FUNCTION PANEL
ALT: 17 U/L (ref 0–35)
AST: 16 U/L (ref 0–37)
Albumin: 3.7 g/dL (ref 3.5–5.2)
Total Bilirubin: 0.5 mg/dL (ref 0.3–1.2)

## 2012-03-02 LAB — CBC WITH DIFFERENTIAL/PLATELET
Basophils Absolute: 0 10*3/uL (ref 0.0–0.1)
Eosinophils Absolute: 0 10*3/uL (ref 0.0–0.7)
HCT: 41.2 % (ref 36.0–46.0)
Hemoglobin: 13.5 g/dL (ref 12.0–15.0)
Lymphs Abs: 2.5 10*3/uL (ref 0.7–4.0)
MCHC: 32.7 g/dL (ref 30.0–36.0)
MCV: 80.5 fl (ref 78.0–100.0)
Monocytes Absolute: 0.5 10*3/uL (ref 0.1–1.0)
Monocytes Relative: 9.9 % (ref 3.0–12.0)
Neutro Abs: 1.8 10*3/uL (ref 1.4–7.7)
Platelets: 266 10*3/uL (ref 150.0–400.0)
RDW: 14.2 % (ref 11.5–14.6)

## 2012-03-02 LAB — LIPID PANEL
Cholesterol: 227 mg/dL — ABNORMAL HIGH (ref 0–200)
HDL: 39.3 mg/dL (ref >39.00)
Triglycerides: 139 mg/dL (ref 0.0–149.0)
VLDL: 27.8 mg/dL (ref 0.0–40.0)

## 2012-03-02 LAB — MICROALBUMIN / CREATININE URINE RATIO: Microalb, Ur: 0.7 mg/dL (ref 0.0–1.9)

## 2012-03-02 LAB — LDL CHOLESTEROL, DIRECT: Direct LDL: 175.9 mg/dL

## 2012-03-09 ENCOUNTER — Encounter: Payer: BC Managed Care – PPO | Admitting: Internal Medicine

## 2012-03-27 ENCOUNTER — Encounter: Payer: Self-pay | Admitting: Internal Medicine

## 2012-03-27 ENCOUNTER — Ambulatory Visit (INDEPENDENT_AMBULATORY_CARE_PROVIDER_SITE_OTHER): Payer: BC Managed Care – PPO | Admitting: Internal Medicine

## 2012-03-27 VITALS — BP 160/90 | HR 64 | Temp 97.9°F | Resp 18 | Ht 64.0 in | Wt 211.0 lb

## 2012-03-27 DIAGNOSIS — I679 Cerebrovascular disease, unspecified: Secondary | ICD-10-CM

## 2012-03-27 DIAGNOSIS — I10 Essential (primary) hypertension: Secondary | ICD-10-CM

## 2012-03-27 DIAGNOSIS — E785 Hyperlipidemia, unspecified: Secondary | ICD-10-CM

## 2012-03-27 MED ORDER — INSULIN DETEMIR 100 UNIT/ML ~~LOC~~ SOLN: SUBCUTANEOUS | Status: DC

## 2012-03-27 MED ORDER — INSULIN ASPART 100 UNIT/ML ~~LOC~~ SOLN: SUBCUTANEOUS | Status: DC

## 2012-03-27 NOTE — Progress Notes (Signed)
Subjective:    Patient ID: [name], female    DOB: 12-Dec-1960, 51 y.o.   MRN: [id number]  HPI  51 year old patient who is seen today for a health maintenance exam. She has been seen by gynecology approximately 10 days ago. Medical problems include insulin requiring diabetes. Recent hemoglobin A1c still in excess of 9. Present medical regimen includes basal insulin 30 units at bedtime and mealtime insulin 20 units 3 times a day. She has a history of cerebral vascular disease and prior pontine stroke. She presented with gait instability. MRA revealed diffuse intracranial atherosclerosis including bilateral significant vertebral artery stenosis. She feels she has recovered fully from her prior stroke  Past Medical History  Diagnosis Date  . Abnormality of gait 05/10/2010  . BACK PAIN 11/14/2008  . CEREBROVASCULAR DISEASE 08/05/2008  . DIABETES MELLITUS, TYPE II 07/15/2008  . Diplopia 07/15/2008  . ECZEMA, ATOPIC 04/03/2009  . HYPERLIPIDEMIA 03/06/2009  . HYPERTENSION 07/15/2008  . Vertebral artery stenosis   . Stroke     History   Social History  . Marital Status: Single    Spouse Name: N/A    Number of Children: N/A  . Years of Education: N/A   Occupational History  . Not on file.   Social History Main Topics  . Smoking status: Never Smoker   . Smokeless tobacco: Never Used  . Alcohol Use: No  . Drug Use: No  . Sexually Active: Not on file   Other Topics Concern  . Not on file   Social History Narrative  . No narrative on file    Past Surgical History  Procedure Date  . Abdominal hysterectomy   . Foot surgery     No family history on file.  No Known Allergies  Current Outpatient Prescriptions on File Prior to Visit  Medication Sig Dispense Refill  . amLODipine (NORVASC) 10 MG tablet Take 1 tablet (10 mg total) by mouth daily.  90 tablet  3  . chlorthalidone (HYGROTON) 25 MG tablet Take 1 tablet (25 mg total) by mouth daily.  90 tablet  3  . glucose blood (ONE  TOUCH ULTRA TEST) test strip Use as instructed  100 each  6  . Insulin Pen Needle (BD PEN NEEDLE NANO U/F) 32G X 4 MM MISC 1 each by Does not apply route 4 (four) times daily -  with meals and at bedtime.  100 each  6  . Lancets (ONETOUCH ULTRASOFT) lancets Use as instructed  100 each  6  . lisinopril (PRINIVIL,ZESTRIL) 40 MG tablet Take 1 tablet (40 mg total) by mouth daily.  90 tablet  3  . metFORMIN (GLUCOPHAGE) 1000 MG tablet Take 1 tablet (1,000 mg total) by mouth 2 (two) times daily with a meal.  180 tablet  3  . pantoprazole (PROTONIX) 40 MG tablet Take 1 tablet (40 mg total) by mouth daily.  90 tablet  0  . PLAVIX 75 MG tablet TAKE ONE TABLET BY MOUTH EVERY DAY  90 each  3  . pravastatin (PRAVACHOL) 40 MG tablet Take 1 tablet (40 mg total) by mouth daily.  90 tablet  3  . triamcinolone ointment (KENALOG) 0.5 % APPLY  TWICE DAILY  30 g  5    BP 160/90  Pulse 64  Temp 97.9 F (36.6 C) (Oral)  Resp 18  Ht 5\' 4"  (1.626 m)  Wt 211 lb (95.709 kg)  BMI 36.22 kg/m2  SpO2 97%       Review of Systems  Constitutional:  Negative for fever, appetite change, fatigue and unexpected weight change.  HENT: Negative for hearing loss, ear pain, nosebleeds, congestion, sore throat, mouth sores, trouble swallowing, neck stiffness, dental problem, voice change, sinus pressure and tinnitus.   Eyes: Negative for photophobia, pain, redness and visual disturbance.  Respiratory: Negative for cough, chest tightness and shortness of breath.   Cardiovascular: Negative for chest pain, palpitations and leg swelling.  Gastrointestinal: Negative for nausea, vomiting, abdominal pain, diarrhea, constipation, blood in stool, abdominal distention and rectal pain.  Genitourinary: Negative for dysuria, urgency, frequency, hematuria, flank pain, vaginal bleeding, vaginal discharge, difficulty urinating, genital sores, vaginal pain, menstrual problem and pelvic pain.  Musculoskeletal: Negative for back pain and  arthralgias.  Skin: Negative for rash.  Neurological: Negative for dizziness, syncope, speech difficulty, weakness, light-headedness, numbness and headaches.  Hematological: Negative for adenopathy. Does not bruise/bleed easily.  Psychiatric/Behavioral: Negative for suicidal ideas, behavioral problems, self-injury, dysphoric mood and agitation. The patient is not nervous/anxious.        Objective:   Physical Exam  Constitutional: She is oriented to person, place, and time. She appears well-developed and well-nourished.       The pressure 140/80  HENT:  Head: Normocephalic and atraumatic.  Right Ear: External ear normal.  Left Ear: External ear normal.  Mouth/Throat: Oropharynx is clear and moist.  Eyes: Conjunctivae normal and EOM are normal.  Neck: Normal range of motion. Neck supple. No JVD present. No thyromegaly present.  Cardiovascular: Normal rate, regular rhythm, normal heart sounds and intact distal pulses.   No murmur heard.      Dorsalis pedis pulses full posterior tibial pulses faint  Pulmonary/Chest: Effort normal and breath sounds normal. She has no wheezes. She has no rales.  Abdominal: Soft. Bowel sounds are normal. She exhibits no distension and no mass. There is no tenderness. There is no rebound and no guarding.  Musculoskeletal: Normal range of motion. She exhibits no edema and no tenderness.  Neurological: She is alert and oriented to person, place, and time. She has normal reflexes. No cranial nerve deficit. She exhibits normal muscle tone. Coordination normal.  Skin: Skin is warm and dry. No rash noted.  Psychiatric: She has a normal mood and affect. Her behavior is normal.          Assessment & Plan:   Preventive health examination Diabetes mellitus. We'll up titrate both basal and mealtime insulin. Exercise weight loss better eating habits all encouraged Hypertension fairly stable compliance stressed Dyslipidemia. Continue pravastatin Cerebral vascular  disease. Continue aggressive risk factor modification as well as Plavix  Recheck 3 months

## 2012-03-27 NOTE — Patient Instructions (Signed)
Limit your sodium (Salt) intake   Please check your hemoglobin A1c every 3 months    It is important that you exercise regularly, at least 20 minutes 3 to 4 times per week.  If you develop chest pain or shortness of breath seek  medical attention.   

## 2012-08-24 ENCOUNTER — Ambulatory Visit (INDEPENDENT_AMBULATORY_CARE_PROVIDER_SITE_OTHER): Payer: BC Managed Care – PPO | Admitting: Internal Medicine

## 2012-08-24 ENCOUNTER — Encounter: Payer: Self-pay | Admitting: Internal Medicine

## 2012-08-24 VITALS — BP 140/90 | HR 64 | Temp 98.2°F | Wt 213.0 lb

## 2012-08-24 DIAGNOSIS — M549 Dorsalgia, unspecified: Secondary | ICD-10-CM

## 2012-08-24 DIAGNOSIS — E119 Type 2 diabetes mellitus without complications: Secondary | ICD-10-CM

## 2012-08-24 DIAGNOSIS — I10 Essential (primary) hypertension: Secondary | ICD-10-CM

## 2012-08-24 MED ORDER — TRAMADOL HCL 50 MG PO TABS: 50.0000 mg | ORAL_TABLET | Freq: Three times a day (TID) | ORAL | Status: DC | PRN

## 2012-08-24 NOTE — Progress Notes (Signed)
Subjective:    Patient ID: [name], female    DOB: [birth year], 52 y.o.   MRN: [id number]  HPI  52 year old patient who has diabetes hypertension and cerebrovascular disease. She is on statin therapy for dyslipidemia. She has a history of back pain and presents today with a chief complaint of low back pain that began 5 days ago. She states she has started a new job that involves considerable bending lifting and stooping. She has diabetes and when asked about insulin management she is very unclear of her insulin dosing. It is doubtful that she take this medication prior to each meal and at bedtime as directed.  Past Medical History  Diagnosis Date  . Abnormality of gait 05/10/2010  . BACK PAIN 11/14/2008  . CEREBROVASCULAR DISEASE 08/05/2008  . DIABETES MELLITUS, TYPE II 07/15/2008  . Diplopia 07/15/2008  . ECZEMA, ATOPIC 04/03/2009  . HYPERLIPIDEMIA 03/06/2009  . HYPERTENSION 07/15/2008  . Vertebral artery stenosis   . Stroke     History   Social History  . Marital Status: Single    Spouse Name: N/A    Number of Children: N/A  . Years of Education: N/A   Occupational History  . Not on file.   Social History Main Topics  . Smoking status: Never Smoker   . Smokeless tobacco: Never Used  . Alcohol Use: No  . Drug Use: No  . Sexually Active: Not on file   Other Topics Concern  . Not on file   Social History Narrative  . No narrative on file    Past Surgical History  Procedure Laterality Date  . Abdominal hysterectomy    . Foot surgery      No family history on file.  No Known Allergies  Current Outpatient Prescriptions on File Prior to Visit  Medication Sig Dispense Refill  . amLODipine (NORVASC) 10 MG tablet Take 1 tablet (10 mg total) by mouth daily.  90 tablet  3  . chlorthalidone (HYGROTON) 25 MG tablet Take 1 tablet (25 mg total) by mouth daily.  90 tablet  3  . glucose blood (ONE TOUCH ULTRA TEST) test strip Use as instructed  100 each  6  . insulin aspart  (NOVOLOG) 100 UNIT/ML injection 26 units prior to each meal. If blood sugar is in excess of 180 take an additional 4 units  3 vial  PRN  . insulin detemir (LEVEMIR) 100 UNIT/ML injection 38 units at bedtime  10 mL  12  . Insulin Pen Needle (BD PEN NEEDLE NANO U/F) 32G X 4 MM MISC 1 each by Does not apply route 4 (four) times daily -  with meals and at bedtime.  100 each  6  . Lancets (ONETOUCH ULTRASOFT) lancets Use as instructed  100 each  6  . lisinopril (PRINIVIL,ZESTRIL) 40 MG tablet Take 1 tablet (40 mg total) by mouth daily.  90 tablet  3  . metFORMIN (GLUCOPHAGE) 1000 MG tablet Take 1 tablet (1,000 mg total) by mouth 2 (two) times daily with a meal.  180 tablet  3  . pantoprazole (PROTONIX) 40 MG tablet Take 1 tablet (40 mg total) by mouth daily.  90 tablet  0  . PLAVIX 75 MG tablet TAKE ONE TABLET BY MOUTH EVERY DAY  90 each  3  . pravastatin (PRAVACHOL) 40 MG tablet Take 1 tablet (40 mg total) by mouth daily.  90 tablet  3  . triamcinolone ointment (KENALOG) 0.5 % APPLY  TWICE DAILY  30 g  5  No current facility-administered medications on file prior to visit.    BP 140/90  Pulse 64  Temp(Src) 98.2 F (36.8 C) (Oral)  Wt 213 lb (96.616 kg)  BMI 36.54 kg/m2       Review of Systems  Constitutional: Negative.   HENT: Negative for hearing loss, congestion, sore throat, rhinorrhea, dental problem, sinus pressure and tinnitus.   Eyes: Negative for pain, discharge and visual disturbance.  Respiratory: Negative for cough and shortness of breath.   Cardiovascular: Negative for chest pain, palpitations and leg swelling.  Gastrointestinal: Negative for nausea, vomiting, abdominal pain, diarrhea, constipation, blood in stool and abdominal distention.  Genitourinary: Negative for dysuria, urgency, frequency, hematuria, flank pain, vaginal bleeding, vaginal discharge, difficulty urinating, vaginal pain and pelvic pain.  Musculoskeletal: Positive for back pain. Negative for joint  swelling, arthralgias and gait problem.  Skin: Negative for rash.  Neurological: Negative for dizziness, syncope, speech difficulty, weakness, numbness and headaches.  Hematological: Negative for adenopathy.  Psychiatric/Behavioral: Negative for behavioral problems, dysphoric mood and agitation. The patient is not nervous/anxious.        Objective:   Physical Exam  Constitutional: She is oriented to person, place, and time. She appears well-developed and well-nourished.  HENT:  Head: Normocephalic.  Right Ear: External ear normal.  Left Ear: External ear normal.  Mouth/Throat: Oropharynx is clear and moist.  Eyes: Conjunctivae and EOM are normal. Pupils are equal, round, and reactive to light.  Neck: Normal range of motion. Neck supple. No thyromegaly present.  Cardiovascular: Normal rate, regular rhythm, normal heart sounds and intact distal pulses.   Pulmonary/Chest: Effort normal and breath sounds normal.  Abdominal: Soft. Bowel sounds are normal. She exhibits no mass. There is no tenderness.  Musculoskeletal: Normal range of motion. She exhibits no edema and no tenderness.  Negative straight leg test  Lymphadenopathy:    She has no cervical adenopathy.  Neurological: She is alert and oriented to person, place, and time.  Skin: Skin is warm and dry. No rash noted.  Psychiatric: She has a normal mood and affect. Her behavior is normal.          Assessment & Plan:   Low back pain. Suspect musculoligamentous. Diabetes mellitus. Will check a hemoglobin A1c. Last hemoglobin A1c was greater than 9. Suspect that compliance issues. Insulin therapy discussed at length Hypertension  Recheck 3 months We'll treat symptomatically with Tylenol Ultram and Flexeril

## 2012-08-24 NOTE — Patient Instructions (Addendum)
Most patients with low back pain will improve with time over the next two to 6 weeks.  Keep active but avoid any activities that cause pain.  Apply moist heat to the low back area several times daily.   Please check your hemoglobin A1c every 3 months   

## 2012-09-07 ENCOUNTER — Ambulatory Visit (INDEPENDENT_AMBULATORY_CARE_PROVIDER_SITE_OTHER): Payer: BC Managed Care – PPO | Admitting: Internal Medicine

## 2012-09-07 ENCOUNTER — Encounter: Payer: Self-pay | Admitting: Internal Medicine

## 2012-09-07 VITALS — BP 140/86 | HR 77 | Temp 99.1°F | Resp 20 | Wt 215.0 lb

## 2012-09-07 DIAGNOSIS — E119 Type 2 diabetes mellitus without complications: Secondary | ICD-10-CM

## 2012-09-07 DIAGNOSIS — I10 Essential (primary) hypertension: Secondary | ICD-10-CM

## 2012-09-07 MED ORDER — INSULIN ASPART 100 UNIT/ML FLEXPEN: PEN_INJECTOR | SUBCUTANEOUS | Status: DC

## 2012-09-07 MED ORDER — INSULIN ASPART 100 UNIT/ML ~~LOC~~ SOLN: SUBCUTANEOUS | Status: DC

## 2012-09-07 NOTE — Progress Notes (Signed)
Subjective:    Patient ID: [name], female    DOB: 02/21/1961, 52 y.o.   MRN: [id number]  HPI  52 year old patient who is seen today with a chief complaint of a nonhealing wound involving her left elbow area. This has been present for about one week.  She has type 2 diabetes. She was seen a few weeks ago and hemoglobin A1c was 11.2.  At that time she was unsure of her insulin dosing and was probably not very compliant with her medication. Today she states that she is taking Levemir 38 units at bedtime and 26 units of NovoLog prior to each meal. Since her last visit here she has had 3 episodes of symptomatic hypoglycemia. She states her blood sugar is less than 70 and she had some diaphoresis  Past Medical History  Diagnosis Date  . Abnormality of gait 05/10/2010  . BACK PAIN 11/14/2008  . CEREBROVASCULAR DISEASE 08/05/2008  . DIABETES MELLITUS, TYPE II 07/15/2008  . Diplopia 07/15/2008  . ECZEMA, ATOPIC 04/03/2009  . HYPERLIPIDEMIA 03/06/2009  . HYPERTENSION 07/15/2008  . Vertebral artery stenosis   . Stroke     History   Social History  . Marital Status: Single    Spouse Name: N/A    Number of Children: N/A  . Years of Education: N/A   Occupational History  . Not on file.   Social History Main Topics  . Smoking status: Never Smoker   . Smokeless tobacco: Never Used  . Alcohol Use: No  . Drug Use: No  . Sexually Active: Not on file   Other Topics Concern  . Not on file   Social History Narrative  . No narrative on file    Past Surgical History  Procedure Laterality Date  . Abdominal hysterectomy    . Foot surgery      No family history on file.  No Known Allergies  Current Outpatient Prescriptions on File Prior to Visit  Medication Sig Dispense Refill  . amLODipine (NORVASC) 10 MG tablet Take 1 tablet (10 mg total) by mouth daily.  90 tablet  3  . chlorthalidone (HYGROTON) 25 MG tablet Take 1 tablet (25 mg total) by mouth daily.  90 tablet  3  . glucose  blood (ONE TOUCH ULTRA TEST) test strip Use as instructed  100 each  6  . insulin aspart (NOVOLOG) 100 UNIT/ML injection 26 units prior to each meal. If blood sugar is in excess of 180 take an additional 4 units  3 vial  PRN  . insulin detemir (LEVEMIR) 100 UNIT/ML injection 38 units at bedtime  10 mL  12  . Insulin Pen Needle (BD PEN NEEDLE NANO U/F) 32G X 4 MM MISC 1 each by Does not apply route 4 (four) times daily -  with meals and at bedtime.  100 each  6  . Lancets (ONETOUCH ULTRASOFT) lancets Use as instructed  100 each  6  . lisinopril (PRINIVIL,ZESTRIL) 40 MG tablet Take 1 tablet (40 mg total) by mouth daily.  90 tablet  3  . metFORMIN (GLUCOPHAGE) 1000 MG tablet Take 1 tablet (1,000 mg total) by mouth 2 (two) times daily with a meal.  180 tablet  3  . pantoprazole (PROTONIX) 40 MG tablet Take 1 tablet (40 mg total) by mouth daily.  90 tablet  0  . PLAVIX 75 MG tablet TAKE ONE TABLET BY MOUTH EVERY DAY  90 each  3  . pravastatin (PRAVACHOL) 40 MG tablet Take 1 tablet (40 mg  total) by mouth daily.  90 tablet  3  . traMADol (ULTRAM) 50 MG tablet Take 1 tablet (50 mg total) by mouth every 8 (eight) hours as needed for pain.  30 tablet  0  . triamcinolone ointment (KENALOG) 0.5 % APPLY  TWICE DAILY  30 g  5   No current facility-administered medications on file prior to visit.    BP 140/86  Pulse 77  Temp(Src) 99.1 F (37.3 C) (Oral)  Resp 20  Wt 215 lb (97.523 kg)  BMI 36.89 kg/m2  SpO2 97%       Review of Systems  Constitutional: Negative.   HENT: Negative for hearing loss, congestion, sore throat, rhinorrhea, dental problem, sinus pressure and tinnitus.   Eyes: Negative for pain, discharge and visual disturbance.  Respiratory: Negative for cough and shortness of breath.   Cardiovascular: Negative for chest pain, palpitations and leg swelling.  Gastrointestinal: Negative for nausea, vomiting, abdominal pain, diarrhea, constipation, blood in stool and abdominal distention.   Genitourinary: Negative for dysuria, urgency, frequency, hematuria, flank pain, vaginal bleeding, vaginal discharge, difficulty urinating, vaginal pain and pelvic pain.  Musculoskeletal: Negative for joint swelling, arthralgias and gait problem.  Skin: Positive for wound. Negative for rash.  Neurological: Negative for dizziness, syncope, speech difficulty, weakness, numbness and headaches.  Hematological: Negative for adenopathy.  Psychiatric/Behavioral: Negative for behavioral problems, dysphoric mood and agitation. The patient is not nervous/anxious.        Objective:   Physical Exam  Constitutional: She appears well-developed and well-nourished. No distress.  Blood pressure 130/80  Skin:  Exam of the left elbow revealed a clean appearing 1 x 1.5 cm crust. There is no surrounding erythema          Assessment & Plan:   Diabetes mellitus.  Presently is having symptomatic hypoglycemia on her present regimen;  all episodes have occurred in the early afternoon following lunch. Fasting blood sugars she states are in the 150-170 range.  We'll decrease the mealtime insulin before breakfast and lunch to 18 units.  Recheck in 2 months as scheduled

## 2012-09-07 NOTE — Patient Instructions (Signed)
Please check your hemoglobin A1c every 3 months    It is important that you exercise regularly, at least 20 minutes 3 to 4 times per week.  If you develop chest pain or shortness of breath seek  medical attention.   

## 2012-10-03 ENCOUNTER — Telehealth: Payer: Self-pay | Admitting: Internal Medicine

## 2012-10-04 ENCOUNTER — Telehealth: Payer: Self-pay | Admitting: Internal Medicine

## 2012-10-04 ENCOUNTER — Other Ambulatory Visit: Payer: Self-pay | Admitting: Internal Medicine

## 2012-10-04 NOTE — Telephone Encounter (Signed)
[facility] Triage Call Report Triage Record Num: [id number] Operator: Geanie Berlin Patient Name: [name] Call Date & Time: 10/03/2012 5:22:04PM Patient Phone: [phone] PCP: Gordy Savers Patient Gender: Female PCP Fax : [phone] Patient DOB: June 05, 1960 [facility] Name: [facility]  Reason for Call:  Caller: Silvia/Patient; PCP: Eleonore Chiquito [facility]); CB#: [phone]; Call regarding Anxiety; Reported "nerve" problem with intermittent tingling in legs, intermittent chest pain, and generalized anxiety regarding finances, job and home life. Onset:> 1 month. Afebrile. Denies insomnia. Random blood sugar 117 at 1715 10/13/12. Advised to see MD within 24 hours for feeling overwhelmed and unable to calm down per Anxiety guideline. No 30 minute appointments remain with any provider for 10/04/12. Cell phone about to cut off, so advised to call office scheduler 10/04/12 AM to request appointment. OFFICE: please call back 10/04/12.  Protocol(s) Used: Anxiety Recommended Outcome per Protocol: See Provider within 24 hours Reason for Outcome: Feeling overwhelmed AND unable to calm down Care Advice: ~ Should not be alone, arrange for support (family member, friend, etc.). ~ Call provider if symptoms worsen or new symptoms develop. Distraction through talking with friends, listening to music, writing, going for a walk or ride can help relieve symptoms. ~ Avoid the use of stimulants including caffeine (coffee, some soft drinks, some energy drinks, tea and chocolate), cocaine, and amphetamines. Also avoid drinking alcohol. ~ Avoid using drugs and alcohol for relaxation. Instead of relaxing you, they may cause more anxiety and may contribute to other problems. Try to focus on finding solutions for your current situation. ~ ~ SYMPTOM / CONDITION MANAGEMENT

## 2012-10-04 NOTE — Telephone Encounter (Signed)
Call back attempted at 0949 on 6-5, vmail left for Pt to call back.

## 2012-10-04 NOTE — Telephone Encounter (Signed)
Patient Information:  Caller Name: [place]  Phone: [phone]  Patient: Fatima Sanger  Gender: Female  DOB: 10-14-1960  Age: 52 Years  PCP: Eleonore Chiquito [facility])  Pregnant: No  Office Follow Up:  Does the office need to follow up with this patient?: No  Instructions For The Office: N/A  RN Note:  Random blood sugar 181 at 1200 10/04/12 before lunch.  Reported glucometer broken.  Advised to see MD within 72 hours for frequent or longer crying spells and feels despondent about symptoms per Anxiety guideline. 30 minute appointment scheduled.   Symptoms  Reason For Call & Symptoms: Anxiety, jittery, mood changes for past year. Friend advised getting it checked due to depression symptoms and stress regarding personal issues. Sometimes her "head feels strange" and sees circles of lights in her eyes.  Reviewed Health History In EMR: Yes  Reviewed Medications In EMR: Yes  Reviewed Allergies In EMR: Yes  Reviewed Surgeries / Procedures: Yes  Date of Onset of Symptoms: Unknown OB / GYN:  LMP: Unknown  Guideline(s) Used:  No Protocol Available - Information Only  Disposition Per Guideline:   Discuss with PCP and Callback by Nurse Today  Reason For Disposition Reached:   Nursing judgment  Advice Given:  N/A  RN Overrode Recommendation:  Make Appointment  Advised to see MD within 72 hours.  Appointment Scheduled:  10/05/2012 13:30:00 Appointment Scheduled Provider:  Kriste Basque [facility])

## 2012-10-04 NOTE — Telephone Encounter (Signed)
Called pt in attempt to schedule OV for today, per nurses recommendations. L/m on V/m for pt to call back.

## 2012-10-04 NOTE — Telephone Encounter (Signed)
Sounds like needs appt, ? Depression from prior notes - advise 30 minute appt.

## 2012-10-04 NOTE — Telephone Encounter (Signed)
For your review

## 2012-10-05 ENCOUNTER — Encounter: Payer: Self-pay | Admitting: Family Medicine

## 2012-10-05 ENCOUNTER — Ambulatory Visit (INDEPENDENT_AMBULATORY_CARE_PROVIDER_SITE_OTHER): Payer: BC Managed Care – PPO | Admitting: Family Medicine

## 2012-10-05 VITALS — BP 170/92 | Temp 98.4°F | Wt 218.0 lb

## 2012-10-05 DIAGNOSIS — F341 Dysthymic disorder: Secondary | ICD-10-CM

## 2012-10-05 DIAGNOSIS — F419 Anxiety disorder, unspecified: Secondary | ICD-10-CM

## 2012-10-05 DIAGNOSIS — I10 Essential (primary) hypertension: Secondary | ICD-10-CM

## 2012-10-05 DIAGNOSIS — E119 Type 2 diabetes mellitus without complications: Secondary | ICD-10-CM

## 2012-10-05 MED ORDER — FLUOXETINE HCL 20 MG PO TABS: 20.0000 mg | ORAL_TABLET | Freq: Every day | ORAL | Status: DC

## 2012-10-05 NOTE — Progress Notes (Signed)
Chief Complaint  Patient presents with  . Depression  . Anxiety    HPI:  Acute visit for anxiety and depression: -52 yo F pt of Dr. Amador Cunas -on and off for a few years - had just been dealing with it -recently talking to a friend who advised her to check into it -symptoms: depressed mood on and off, financial stress, worries about a lot of things - mainly finances, feels threatened by bill collectors at times, becomes tearful at times, anhedonia, stress gets to her at times and feels foggy and irritable, had episode a few days ago where she just felt out of it for a short periods of time - checked sugar and was fine -denies: panic attacks, thoughts of self harm, SI  HTN: -ran out of BP medication yesterday, did not take medication today -Denies: HA, CP, SOB, vision   DM: -uncontrolled per recent labs -reports take metformin once daily, lantus 38 units before bed, novolog with every meal - 18 with breakfast and lunch and 26 with dinner -checks BS fasting: 190s or so, postprandials 150s  -no low BS in the last few weeks, eating regular meals   ROS: See pertinent positives and negatives per HPI.  Past Medical History  Diagnosis Date  . Abnormality of gait 05/10/2010  . BACK PAIN 11/14/2008  . CEREBROVASCULAR DISEASE 08/05/2008  . DIABETES MELLITUS, TYPE II 07/15/2008  . Diplopia 07/15/2008  . ECZEMA, ATOPIC 04/03/2009  . HYPERLIPIDEMIA 03/06/2009  . HYPERTENSION 07/15/2008  . Vertebral artery stenosis   . Stroke     No family history on file.  History   Social History  . Marital Status: Single    Spouse Name: N/A    Number of Children: N/A  . Years of Education: N/A   Social History Main Topics  . Smoking status: Never Smoker   . Smokeless tobacco: Never Used  . Alcohol Use: No  . Drug Use: No  . Sexually Active: None   Other Topics Concern  . None   Social History Narrative  . None    Current outpatient prescriptions:amLODipine (NORVASC) 10 MG tablet, Take 1  tablet (10 mg total) by mouth daily., Disp: 90 tablet, Rfl: 3;  chlorthalidone (HYGROTON) 25 MG tablet, Take 1 tablet (25 mg total) by mouth daily., Disp: 90 tablet, Rfl: 3;  clopidogrel (PLAVIX) 75 MG tablet, TAKE ONE TABLET BY MOUTH EVERY DAY, Disp: 90 tablet, Rfl: 1;  glucose blood (ONE TOUCH ULTRA TEST) test strip, Use as instructed, Disp: 100 each, Rfl: 6 insulin aspart (NOVOLOG FLEXPEN) 100 unit/mL SOLN FlexPen, 18 units prior to breakfast and lunch and 26 units prior to your evening meal, Disp: , Rfl: ;  insulin detemir (LEVEMIR) 100 UNIT/ML injection, 38 units at bedtime, Disp: 10 mL, Rfl: 12;  Insulin Pen Needle (BD PEN NEEDLE NANO U/F) 32G X 4 MM MISC, 1 each by Does not apply route 4 (four) times daily -  with meals and at bedtime., Disp: 100 each, Rfl: 6 Lancets (ONETOUCH ULTRASOFT) lancets, Use as instructed, Disp: 100 each, Rfl: 6;  lisinopril (PRINIVIL,ZESTRIL) 40 MG tablet, Take 1 tablet (40 mg total) by mouth daily., Disp: 90 tablet, Rfl: 3;  metFORMIN (GLUCOPHAGE) 1000 MG tablet, Take 1 tablet (1,000 mg total) by mouth 2 (two) times daily with a meal., Disp: 180 tablet, Rfl: 3;  pantoprazole (PROTONIX) 40 MG tablet, TAKE ONE TABLET BY MOUTH EVERY DAY, Disp: 90 tablet, Rfl: 1 pravastatin (PRAVACHOL) 40 MG tablet, Take 1 tablet (40 mg total)  by mouth daily., Disp: 90 tablet, Rfl: 3;  traMADol (ULTRAM) 50 MG tablet, Take 1 tablet (50 mg total) by mouth every 8 (eight) hours as needed for pain., Disp: 30 tablet, Rfl: 0;  triamcinolone ointment (KENALOG) 0.5 %, APPLY  TWICE DAILY, Disp: 30 g, Rfl: 5;  FLUoxetine (PROZAC) 20 MG tablet, Take 1 tablet (20 mg total) by mouth daily., Disp: 30 tablet, Rfl: 1  EXAM:  Filed Vitals:   10/05/12 1327  BP: 170/92  Temp: 98.4 F (36.9 C)    Body mass index is 37.4 kg/(m^2).  GENERAL: vitals reviewed and listed above, alert, oriented, appears well hydrated and in no acute distress  HEENT: atraumatic, conjunttiva clear, no obvious abnormalities on  inspection of external nose and ears  NECK: no obvious masses on inspection  LUNGS: clear to auscultation bilaterally, no wheezes, rales or rhonchi, good air movement  CV: HRRR, no peripheral edema  MS: moves all extremities without noticeable abnormality  PSYCH: pleasant and cooperative, flat affect, depressed mood  ASSESSMENT AND PLAN:  Discussed the following assessment and plan:  Anxiety and depression - Plan: FLUoxetine (PROZAC) 20 MG tablet  HYPERTENSION  DIABETES MELLITUS, TYPE II  -discussed options, she would like to start a medication for this and after discussion of options will start prozac at a low dose daily - risks discussed, return and emergency precautions, follow up in one month. Also advised counseling and info provided. -did nt take BP meds today - checked on refills and looks like sent in today - follow up with PCP in 1 month to recheck BP back on meds -discussed diabetes, only taking metformin once daily, advised increasing to 1000mg  am, 500mg  pm and follow up with PCP in 1 month, call if any low blood sugars -Patient advised to return or notify a doctor immediately if symptoms worsen or persist or new concerns arise.  Patient Instructions  -As we discussed, we have prescribed a new medication for you at this appointment. We discussed the common and serious potential adverse effects of this medication and you can review these and more with the pharmacist when you pick up your medication.  Please follow the instructions for use carefully and notify us immediately if you have any problems taking this medication.  -start prozac once daily for the depression and anxiety - see a doctor immediately if worsening depression, call a doctor or 911 immediately if any thoughts of hurting yourself  -call for an appointment with the counselor  -work on healthy regular meals and regular exercise  -consider increasing metformin to 1000mg  in the morning and 500mg  in the  evening, let your doctor immediately know if any low blood sugars. Keep a written log of blood sugars and bring to your follow up appointment.  -follow up with your doctor in 1 month or sooner if concerns     Naim Murtha, [facility] R.

## 2012-10-05 NOTE — Telephone Encounter (Signed)
30 minute appt made.

## 2012-10-05 NOTE — Patient Instructions (Addendum)
-  As we discussed, we have prescribed a new medication for you at this appointment. We discussed the common and serious potential adverse effects of this medication and you can review these and more with the pharmacist when you pick up your medication.  Please follow the instructions for use carefully and notify us immediately if you have any problems taking this medication.  -start prozac once daily for the depression and anxiety - see a doctor immediately if worsening depression, call a doctor or 911 immediately if any thoughts of hurting yourself  -call for an appointment with the counselor  -work on healthy regular meals and regular exercise  -consider increasing metformin to 1000mg  in the morning and 500mg  in the evening, let your doctor immediately know if any low blood sugars. Keep a written log of blood sugars and bring to your follow up appointment.  -follow up with your doctor in 1 month or sooner if concerns

## 2012-10-08 ENCOUNTER — Telehealth: Payer: Self-pay | Admitting: Internal Medicine

## 2012-10-08 NOTE — Telephone Encounter (Signed)
Pt saw Dr Selena Batten on Friday for depression, but pt really want to see you. She is also concerned and has  about the metformin she is on.  Pt would like to come in tomorrow, 6/10. Only "same day" appt is it ok to schedule?

## 2012-10-08 NOTE — Telephone Encounter (Signed)
lmom pt's appt. Ok per pt/kh

## 2012-10-10 ENCOUNTER — Encounter: Payer: Self-pay | Admitting: Internal Medicine

## 2012-10-10 ENCOUNTER — Ambulatory Visit (INDEPENDENT_AMBULATORY_CARE_PROVIDER_SITE_OTHER): Payer: BC Managed Care – PPO | Admitting: Internal Medicine

## 2012-10-10 VITALS — BP 146/90 | HR 66 | Temp 98.5°F | Resp 20 | Wt 215.0 lb

## 2012-10-10 DIAGNOSIS — I10 Essential (primary) hypertension: Secondary | ICD-10-CM

## 2012-10-10 DIAGNOSIS — E119 Type 2 diabetes mellitus without complications: Secondary | ICD-10-CM

## 2012-10-10 DIAGNOSIS — I679 Cerebrovascular disease, unspecified: Secondary | ICD-10-CM

## 2012-10-10 DIAGNOSIS — E785 Hyperlipidemia, unspecified: Secondary | ICD-10-CM

## 2012-10-11 ENCOUNTER — Ambulatory Visit: Payer: BC Managed Care – PPO | Admitting: Internal Medicine

## 2012-10-11 ENCOUNTER — Encounter: Payer: Self-pay | Admitting: Internal Medicine

## 2012-10-11 NOTE — Patient Instructions (Signed)
Please check your hemoglobin A1c every 3 months    It is important that you exercise regularly, at least 20 minutes 3 to 4 times per week.  If you develop chest pain or shortness of breath seek  medical attention.   

## 2012-10-11 NOTE — Progress Notes (Signed)
Subjective:    Patient ID: [name], female    DOB: April 29, 1961, 52 y.o.   MRN: [id number]  HPI  52--old patient who is seen today for followup. Medical issues include hypertension cerebral vascular disease and dyslipidemia. Diabetic regimen includes mealtime insulin as well as basal insulin. She has been on metformin which has caused significant diarrhea. She was seen last week and placed on Prozac for clinical depression.   Past Medical History  Diagnosis Date  . Abnormality of gait 05/10/2010  . BACK PAIN 11/14/2008  . CEREBROVASCULAR DISEASE 08/05/2008  . DIABETES MELLITUS, TYPE II 07/15/2008  . Diplopia 07/15/2008  . ECZEMA, ATOPIC 04/03/2009  . HYPERLIPIDEMIA 03/06/2009  . HYPERTENSION 07/15/2008  . Vertebral artery stenosis   . Stroke     History   Social History  . Marital Status: Single    Spouse Name: N/A    Number of Children: N/A  . Years of Education: N/A   Occupational History  . Not on file.   Social History Main Topics  . Smoking status: Never Smoker   . Smokeless tobacco: Never Used  . Alcohol Use: No  . Drug Use: No  . Sexually Active: Not on file   Other Topics Concern  . Not on file   Social History Narrative  . No narrative on file    Past Surgical History  Procedure Laterality Date  . Abdominal hysterectomy    . Foot surgery      No family history on file.  No Known Allergies  Current Outpatient Prescriptions on File Prior to Visit  Medication Sig Dispense Refill  . amLODipine (NORVASC) 10 MG tablet Take 1 tablet (10 mg total) by mouth daily.  90 tablet  3  . chlorthalidone (HYGROTON) 25 MG tablet Take 1 tablet (25 mg total) by mouth daily.  90 tablet  3  . clopidogrel (PLAVIX) 75 MG tablet TAKE ONE TABLET BY MOUTH EVERY DAY  90 tablet  1  . FLUoxetine (PROZAC) 20 MG tablet Take 1 tablet (20 mg total) by mouth daily.  30 tablet  1  . glucose blood (ONE TOUCH ULTRA TEST) test strip Use as instructed  100 each  6  . insulin aspart (NOVOLOG  FLEXPEN) 100 unit/mL SOLN FlexPen 18 units prior to breakfast and lunch and 26 units prior to your evening meal      . insulin detemir (LEVEMIR) 100 UNIT/ML injection 38 units at bedtime  10 mL  12  . Insulin Pen Needle (BD PEN NEEDLE NANO U/F) 32G X 4 MM MISC 1 each by Does not apply route 4 (four) times daily -  with meals and at bedtime.  100 each  6  . Lancets (ONETOUCH ULTRASOFT) lancets Use as instructed  100 each  6  . metFORMIN (GLUCOPHAGE) 1000 MG tablet Take 1 tablet (1,000 mg total) by mouth 2 (two) times daily with a meal.  180 tablet  3  . pantoprazole (PROTONIX) 40 MG tablet TAKE ONE TABLET BY MOUTH EVERY DAY  90 tablet  1  . pravastatin (PRAVACHOL) 40 MG tablet Take 1 tablet (40 mg total) by mouth daily.  90 tablet  3  . traMADol (ULTRAM) 50 MG tablet Take 1 tablet (50 mg total) by mouth every 8 (eight) hours as needed for pain.  30 tablet  0  . triamcinolone ointment (KENALOG) 0.5 % APPLY  TWICE DAILY  30 g  5   No current facility-administered medications on file prior to visit.  BP 146/90  Pulse 66  Temp(Src) 98.5 F (36.9 C) (Oral)  Resp 20  Wt 215 lb (97.523 kg)  BMI 36.89 kg/m2  SpO2 97%       Review of Systems  Constitutional: Negative.   HENT: Negative for hearing loss, congestion, sore throat, rhinorrhea, dental problem, sinus pressure and tinnitus.   Eyes: Negative for pain, discharge and visual disturbance.  Respiratory: Negative for cough and shortness of breath.   Cardiovascular: Negative for chest pain, palpitations and leg swelling.  Gastrointestinal: Positive for diarrhea. Negative for nausea, vomiting, abdominal pain, constipation, blood in stool and abdominal distention.  Genitourinary: Negative for dysuria, urgency, frequency, hematuria, flank pain, vaginal bleeding, vaginal discharge, difficulty urinating, vaginal pain and pelvic pain.  Musculoskeletal: Negative for joint swelling, arthralgias and gait problem.  Skin: Negative for rash.   Neurological: Negative for dizziness, syncope, speech difficulty, weakness, numbness and headaches.  Hematological: Negative for adenopathy.  Psychiatric/Behavioral: Positive for sleep disturbance, dysphoric mood and decreased concentration. Negative for behavioral problems and agitation. The patient is not nervous/anxious.        Objective:   Physical Exam  Constitutional: She is oriented to person, place, and time. She appears well-developed and well-nourished.  HENT:  Head: Normocephalic.  Right Ear: External ear normal.  Left Ear: External ear normal.  Mouth/Throat: Oropharynx is clear and moist.  Eyes: Conjunctivae and EOM are normal. Pupils are equal, round, and reactive to light.  Neck: Normal range of motion. Neck supple. No thyromegaly present.  Cardiovascular: Normal rate, regular rhythm, normal heart sounds and intact distal pulses.   Pulmonary/Chest: Effort normal and breath sounds normal.  Abdominal: Soft. Bowel sounds are normal. She exhibits no mass. There is no tenderness.  Musculoskeletal: Normal range of motion.  Lymphadenopathy:    She has no cervical adenopathy.  Neurological: She is alert and oriented to person, place, and time.  Skin: Skin is warm and dry. No rash noted.  Psychiatric: She has a normal mood and affect. Her behavior is normal.          Assessment & Plan:   Diarrhea. We'll discontinue metformin therapy;  we'll continue the multiple daily insulin injections Clinical depression. Will continue Prozac. This has been started 1 week ago. We'll recheck in 4 weeks Hypertension stable Dyslipidemia Cerebrovascular disease

## 2012-11-05 ENCOUNTER — Ambulatory Visit: Payer: BC Managed Care – PPO | Admitting: Internal Medicine

## 2012-11-16 ENCOUNTER — Ambulatory Visit: Payer: BC Managed Care – PPO | Admitting: Internal Medicine

## 2012-11-16 ENCOUNTER — Other Ambulatory Visit: Payer: Self-pay | Admitting: Internal Medicine

## 2012-11-22 ENCOUNTER — Encounter: Payer: Self-pay | Admitting: Internal Medicine

## 2013-01-09 ENCOUNTER — Other Ambulatory Visit: Payer: Self-pay | Admitting: Family Medicine

## 2013-01-10 NOTE — Telephone Encounter (Signed)
Forwarding to PCP.

## 2013-02-01 ENCOUNTER — Ambulatory Visit (INDEPENDENT_AMBULATORY_CARE_PROVIDER_SITE_OTHER): Payer: BC Managed Care – PPO | Admitting: Internal Medicine

## 2013-02-01 ENCOUNTER — Encounter: Payer: Self-pay | Admitting: Internal Medicine

## 2013-02-01 VITALS — BP 136/80 | HR 60 | Temp 98.1°F | Resp 20 | Wt 213.0 lb

## 2013-02-01 DIAGNOSIS — E785 Hyperlipidemia, unspecified: Secondary | ICD-10-CM

## 2013-02-01 DIAGNOSIS — I679 Cerebrovascular disease, unspecified: Secondary | ICD-10-CM

## 2013-02-01 DIAGNOSIS — Z23 Encounter for immunization: Secondary | ICD-10-CM

## 2013-02-01 DIAGNOSIS — E119 Type 2 diabetes mellitus without complications: Secondary | ICD-10-CM

## 2013-02-01 DIAGNOSIS — I10 Essential (primary) hypertension: Secondary | ICD-10-CM

## 2013-02-01 NOTE — Progress Notes (Signed)
Subjective:    Patient ID: [name], female    DOB: 1960-08-29, 52 y.o.   MRN: [id number]  HPI  52 year old patient who is in today for followup. She has type 2 diabetes which has been managed on metformin as well as basal and bolus insulin therapy. There has been some compliance issues in the past. No recent hemoglobin A1c about 6 months ago was quite elevated at greater than 11 g percent. More recently she has been on Prozac. She states that her depression has improved. His hypertension dyslipidemia and cerebrovascular disease. She has multiple somatic complaints today include cough back pain right thigh pain hot flashes and some discomfort involving the left great toe  Past Medical History  Diagnosis Date  . Abnormality of gait 05/10/2010  . BACK PAIN 11/14/2008  . CEREBROVASCULAR DISEASE 08/05/2008  . DIABETES MELLITUS, TYPE II 07/15/2008  . Diplopia 07/15/2008  . ECZEMA, ATOPIC 04/03/2009  . HYPERLIPIDEMIA 03/06/2009  . HYPERTENSION 07/15/2008  . Vertebral artery stenosis   . Stroke     History   Social History  . Marital Status: Single    Spouse Name: N/A    Number of Children: N/A  . Years of Education: N/A   Occupational History  . Not on file.   Social History Main Topics  . Smoking status: Never Smoker   . Smokeless tobacco: Never Used  . Alcohol Use: No  . Drug Use: No  . Sexual Activity: Not on file   Other Topics Concern  . Not on file   Social History Narrative  . No narrative on file    Past Surgical History  Procedure Laterality Date  . Abdominal hysterectomy    . Foot surgery      No family history on file.  No Known Allergies  Current Outpatient Prescriptions on File Prior to Visit  Medication Sig Dispense Refill  . amLODipine (NORVASC) 10 MG tablet TAKE ONE TABLET BY MOUTH EVERY DAY  90 tablet  0  . chlorthalidone (HYGROTON) 25 MG tablet TAKE ONE TABLET BY MOUTH EVERY DAY  90 tablet  0  . clopidogrel (PLAVIX) 75 MG tablet TAKE ONE TABLET BY  MOUTH EVERY DAY  90 tablet  1  . FLUoxetine (PROZAC) 20 MG capsule TAKE ONE CAPSULE BY MOUTH ONCE DAILY  90 capsule  3  . glucose blood (ONE TOUCH ULTRA TEST) test strip Use as instructed  100 each  6  . insulin aspart (NOVOLOG FLEXPEN) 100 unit/mL SOLN FlexPen 18 units prior to breakfast and lunch and 26 units prior to your evening meal      . insulin detemir (LEVEMIR) 100 UNIT/ML injection 38 units at bedtime  10 mL  12  . Insulin Pen Needle (BD PEN NEEDLE NANO U/F) 32G X 4 MM MISC 1 each by Does not apply route 4 (four) times daily -  with meals and at bedtime.  100 each  6  . Lancets (ONETOUCH ULTRASOFT) lancets Use as instructed  100 each  6  . metFORMIN (GLUCOPHAGE) 1000 MG tablet Take 1 tablet (1,000 mg total) by mouth 2 (two) times daily with a meal.  180 tablet  3  . pantoprazole (PROTONIX) 40 MG tablet TAKE ONE TABLET BY MOUTH EVERY DAY  90 tablet  1  . pravastatin (PRAVACHOL) 40 MG tablet Take 1 tablet (40 mg total) by mouth daily.  90 tablet  3  . traMADol (ULTRAM) 50 MG tablet Take 1 tablet (50 mg total) by mouth every 8 (eight)  hours as needed for pain.  30 tablet  0  . triamcinolone ointment (KENALOG) 0.5 % APPLY  TWICE DAILY  30 g  5   No current facility-administered medications on file prior to visit.    BP 136/80  Pulse 60  Temp(Src) 98.1 F (36.7 C) (Oral)  Resp 20  Wt 213 lb (96.616 kg)  BMI 36.54 kg/m2  SpO2 98%       Review of Systems  Constitutional: Negative.   HENT: Negative for hearing loss, congestion, sore throat, rhinorrhea, dental problem, sinus pressure and tinnitus.   Eyes: Negative for pain, discharge and visual disturbance.  Respiratory: Positive for cough. Negative for shortness of breath.   Cardiovascular: Negative for chest pain, palpitations and leg swelling.  Gastrointestinal: Negative for nausea, vomiting, abdominal pain, diarrhea, constipation, blood in stool and abdominal distention.  Genitourinary: Negative for dysuria, urgency,  frequency, hematuria, flank pain, vaginal bleeding, vaginal discharge, difficulty urinating, vaginal pain and pelvic pain.  Musculoskeletal: Positive for back pain and gait problem. Negative for joint swelling and arthralgias.  Skin: Negative for rash.  Neurological: Negative for dizziness, syncope, speech difficulty, weakness, numbness and headaches.  Hematological: Negative for adenopathy.  Psychiatric/Behavioral: Negative for behavioral problems, dysphoric mood and agitation. The patient is not nervous/anxious.        Objective:   Physical Exam  Constitutional: She is oriented to person, place, and time. She appears well-developed and well-nourished.  Blood pressure 130/80  HENT:  Head: Normocephalic.  Right Ear: External ear normal.  Left Ear: External ear normal.  Mouth/Throat: Oropharynx is clear and moist.  Eyes: Conjunctivae and EOM are normal. Pupils are equal, round, and reactive to light.  Neck: Normal range of motion. Neck supple. Thyromegaly present.  Cardiovascular: Normal rate, regular rhythm and normal heart sounds.   Dorsalis pedis pulses are full posterior tibial pulses are faint  Pulmonary/Chest: Effort normal and breath sounds normal.  Abdominal: Soft. Bowel sounds are normal. She exhibits no mass. There is no tenderness.  Musculoskeletal: Normal range of motion.  Lymphadenopathy:    She has no cervical adenopathy.  Neurological: She is alert and oriented to person, place, and time.  Skin: Skin is warm and dry. No rash noted.  Psychiatric: She has a normal mood and affect. Her behavior is normal.          Assessment & Plan:   Diabetes mellitus. Unclear control. Compliance issues addressed. We'll check a hemoglobin A1c. The patient has had an eye examination this year. Diabetic foot exam performed today and was unremarkable Hypertension well controlled Cerebral vascular disease. Continue aggressive risk factor modification as well as Plavix

## 2013-02-01 NOTE — Patient Instructions (Signed)
Limit your sodium (Salt) intake   Please check your hemoglobin A1c every 3 months  Please check your blood pressure on a regular basis.  If it is consistently greater than 150/90, please make an office appointment.    It is important that you exercise regularly, at least 20 minutes 3 to 4 times per week.  If you develop chest pain or shortness of breath seek  medical attention.  You need to lose weight.  Consider a lower calorie diet and regular exercise. 

## 2013-02-25 ENCOUNTER — Other Ambulatory Visit: Payer: Self-pay | Admitting: Internal Medicine

## 2013-04-03 ENCOUNTER — Encounter: Payer: Self-pay | Admitting: Podiatry

## 2013-04-03 ENCOUNTER — Ambulatory Visit (INDEPENDENT_AMBULATORY_CARE_PROVIDER_SITE_OTHER): Payer: BC Managed Care – PPO | Admitting: Podiatry

## 2013-04-03 ENCOUNTER — Ambulatory Visit (INDEPENDENT_AMBULATORY_CARE_PROVIDER_SITE_OTHER): Payer: BC Managed Care – PPO

## 2013-04-03 VITALS — BP 153/86 | HR 55 | Resp 16

## 2013-04-03 DIAGNOSIS — M79671 Pain in right foot: Secondary | ICD-10-CM

## 2013-04-03 DIAGNOSIS — M79609 Pain in unspecified limb: Secondary | ICD-10-CM

## 2013-04-03 DIAGNOSIS — M779 Enthesopathy, unspecified: Secondary | ICD-10-CM

## 2013-04-03 DIAGNOSIS — L600 Ingrowing nail: Secondary | ICD-10-CM

## 2013-04-03 NOTE — Patient Instructions (Signed)
Diabetes and Foot Care Diabetes may cause you to have problems because of poor blood supply (circulation) to your feet and legs. This may cause the skin on your feet to become thinner, break easier, and heal more slowly. Your skin may become dry, and the skin may peel and crack. You may also have nerve damage in your legs and feet causing decreased feeling in them. You may not notice minor injuries to your feet that could lead to infections or more serious problems. Taking care of your feet is one of the most important things you can do for yourself.  HOME CARE INSTRUCTIONS  Wear shoes at all times, even in the house. Do not go barefoot. Bare feet are easily injured.  Check your feet daily for blisters, cuts, and redness. If you cannot see the bottom of your feet, use a mirror or ask someone for help.  Wash your feet with warm water (do not use hot water) and mild soap. Then pat your feet and the areas between your toes until they are completely dry. Do not soak your feet as this can dry your skin.  Apply a moisturizing lotion or petroleum jelly (that does not contain alcohol and is unscented) to the skin on your feet and to dry, brittle toenails. Do not apply lotion between your toes.  Trim your toenails straight across. Do not dig under them or around the cuticle. File the edges of your nails with an emery board or nail file.  Do not cut corns or calluses or try to remove them with medicine.  Wear clean socks or stockings every day. Make sure they are not too tight. Do not wear knee-high stockings since they may decrease blood flow to your legs.  Wear shoes that fit properly and have enough cushioning. To break in new shoes, wear them for just a few hours a day. This prevents you from injuring your feet. Always look in your shoes before you put them on to be sure there are no objects inside.  Do not cross your legs. This may decrease the blood flow to your feet.  If you find a minor scrape,  cut, or break in the skin on your feet, keep it and the skin around it clean and dry. These areas may be cleansed with mild soap and water. Do not cleanse the area with peroxide, alcohol, or iodine.  When you remove an adhesive bandage, be sure not to damage the skin around it.  If you have a wound, look at it several times a day to make sure it is healing.  Do not use heating pads or hot water bottles. They may burn your skin. If you have lost feeling in your feet or legs, you may not know it is happening until it is too late.  Make sure your health care provider performs a complete foot exam at least annually or more often if you have foot problems. Report any cuts, sores, or bruises to your health care provider immediately. SEEK MEDICAL CARE IF:   You have an injury that is not healing.  You have cuts or breaks in the skin.  You have an ingrown nail.  You notice redness on your legs or feet.  You feel burning or tingling in your legs or feet.  You have pain or cramps in your legs and feet.  Your legs or feet are numb.  Your feet always feel cold. SEEK IMMEDIATE MEDICAL CARE IF:   There is increasing redness,   swelling, or pain in or around a wound.  There is a red line that goes up your leg.  Pus is coming from a wound.  You develop a fever or as directed by your health care provider.  You notice a bad smell coming from an ulcer or wound. Document Released: 04/15/2000 Document Revised: 12/19/2012 Document Reviewed: 09/25/2012 [employer] Patient Information 2014 [employer].  

## 2013-04-03 NOTE — Progress Notes (Signed)
Subjective:     Patient ID: [name], female   DOB: 01-22-1961, 52 y.o.   MRN: [id number]  HPI patient is found to have incurvated nailbeds medial border bilateral that are mildly tender and complaints of numbness or tingling on both feet nondescript in nature   Review of Systems  All other systems reviewed and are negative.       Objective:   Physical Exam  Nursing note and vitals reviewed. Constitutional: She is oriented to person, place, and time.  Cardiovascular: Intact distal pulses.   Musculoskeletal: Normal range of motion.  Neurological: She is oriented to person, place, and time.  Skin: Skin is warm.   patient's neurovascular status was adequate and mildly reduced with thick incurvated nail bed hallux bilateral with some dystrophic tissue on the medial side DTR reflexes intact and signs Weinstein test normal currently     Assessment:     Does not appear to be neuropathy but may be ingrown toenails big toes both feet    Plan:     H&P and diabetic care discussed with patient and I debrided out the medial borders to see if this reduces pressure. Reappoint if symptoms continue

## 2013-04-20 ENCOUNTER — Other Ambulatory Visit: Payer: Self-pay | Admitting: Internal Medicine

## 2013-06-17 ENCOUNTER — Other Ambulatory Visit: Payer: Self-pay | Admitting: Internal Medicine

## 2013-08-06 ENCOUNTER — Telehealth: Payer: Self-pay | Admitting: Internal Medicine

## 2013-08-06 NOTE — Telephone Encounter (Signed)
Called pt told her Dr. Raliegh Ip wants to see her tomorrow morning, asked her if she can come at 9:00 am. Pt said yes. Told her okay will see her then. Asked pt if she is symptomatic at present. Pt denies any symptoms at present. Pt added to schedule tomorrow by Constance Holster.

## 2013-08-06 NOTE — Telephone Encounter (Signed)
Patient Information:  Caller Name: Roanna  Phone: [phone]  Patient: [name]  Gender: Female  DOB: 1960-10-20  Age: 53 Years  PCP: [facility])  Pregnant: No  Office Follow Up:  Does the office need to follow up with this patient?: Yes  Instructions For The Office: Please call with recommedations.  RN Note:  Pt's FSBS has been running high today. Her readings are 299, 359, 459, then "High" just now. She has no sliding scale. Asymptomatic.  Symptoms  Reason For Call & Symptoms: FSBS shows "high"  Reviewed Health History In EMR: Yes  Reviewed Medications In EMR: Yes  Reviewed Allergies In EMR: Yes  Reviewed Surgeries / Procedures: Yes  Date of Onset of Symptoms: 08/06/2013 OB / GYN:  LMP: Unknown  Guideline(s) Used:  Diabetes - High Blood Sugar  Disposition Per Guideline:   Go to ED Now (or to Office with PCP Approval)  Reason For Disposition Reached:   Blood glucose > 500 mg/dl (27.5 mmol/l)  Advice Given:  Treatment - Liquids  Drink at least one glass (8 oz or 240 ml) of water per hour for the next 4 hours. (Reason: adequate hydration will reduce hyperglycemia).  Generally, you should try to drink 6-8 glasses of water each day.  Call Back If:  Rapid breathing occurs  You become worse.  RN Overrode Recommendation:  Document Patient  Please call.

## 2013-08-06 NOTE — Telephone Encounter (Signed)
Please see message and advise 

## 2013-08-06 NOTE — Telephone Encounter (Signed)
Discussed pt's elevated blood sugars with Dr. Raliegh Ip he said add to schedule tomorrow morning.

## 2013-08-07 ENCOUNTER — Encounter: Payer: Self-pay | Admitting: Internal Medicine

## 2013-08-07 ENCOUNTER — Ambulatory Visit (INDEPENDENT_AMBULATORY_CARE_PROVIDER_SITE_OTHER): Payer: Commercial Managed Care - PPO | Admitting: Internal Medicine

## 2013-08-07 ENCOUNTER — Telehealth: Payer: Self-pay | Admitting: Internal Medicine

## 2013-08-07 ENCOUNTER — Encounter: Payer: Self-pay | Admitting: *Deleted

## 2013-08-07 VITALS — BP 130/70 | HR 64 | Temp 98.3°F | Resp 20 | Ht 64.0 in | Wt 197.0 lb

## 2013-08-07 DIAGNOSIS — E785 Hyperlipidemia, unspecified: Secondary | ICD-10-CM

## 2013-08-07 DIAGNOSIS — I10 Essential (primary) hypertension: Secondary | ICD-10-CM

## 2013-08-07 DIAGNOSIS — E119 Type 2 diabetes mellitus without complications: Secondary | ICD-10-CM

## 2013-08-07 DIAGNOSIS — H538 Other visual disturbances: Secondary | ICD-10-CM

## 2013-08-07 DIAGNOSIS — I679 Cerebrovascular disease, unspecified: Secondary | ICD-10-CM

## 2013-08-07 LAB — TSH: TSH: 0.6 u[IU]/mL (ref 0.35–5.50)

## 2013-08-07 LAB — CBC WITH DIFFERENTIAL/PLATELET
Basophils Absolute: 0 10*3/uL (ref 0.0–0.1)
Basophils Relative: 0.4 % (ref 0.0–3.0)
EOS ABS: 0.1 10*3/uL (ref 0.0–0.7)
Eosinophils Relative: 1.3 % (ref 0.0–5.0)
HCT: 39.3 % (ref 36.0–46.0)
Hemoglobin: 13.1 g/dL (ref 12.0–15.0)
LYMPHS ABS: 3.1 10*3/uL (ref 0.7–4.0)
Lymphocytes Relative: 47.9 % — ABNORMAL HIGH (ref 12.0–46.0)
MCHC: 33.2 g/dL (ref 30.0–36.0)
MCV: 78 fl (ref 78.0–100.0)
Monocytes Absolute: 0.6 10*3/uL (ref 0.1–1.0)
Monocytes Relative: 9.5 % (ref 3.0–12.0)
NEUTROS ABS: 2.6 10*3/uL (ref 1.4–7.7)
NEUTROS PCT: 40.9 % — AB (ref 43.0–77.0)
Platelets: 260 10*3/uL (ref 150.0–400.0)
RBC: 5.04 Mil/uL (ref 3.87–5.11)
RDW: 15.1 % — ABNORMAL HIGH (ref 11.5–14.6)
WBC: 6.4 10*3/uL (ref 4.5–10.5)

## 2013-08-07 LAB — HEMOGLOBIN A1C: Hgb A1c MFr Bld: 14.5 % — ABNORMAL HIGH (ref 4.6–6.5)

## 2013-08-07 LAB — COMPREHENSIVE METABOLIC PANEL
ALBUMIN: 3.5 g/dL (ref 3.5–5.2)
ALT: 19 U/L (ref 0–35)
AST: 16 U/L (ref 0–37)
Alkaline Phosphatase: 77 U/L (ref 39–117)
BUN: 13 mg/dL (ref 6–23)
CO2: 30 mEq/L (ref 19–32)
Calcium: 9.8 mg/dL (ref 8.4–10.5)
Chloride: 98 mEq/L (ref 96–112)
Creatinine, Ser: 0.7 mg/dL (ref 0.4–1.2)
GFR: 119.92 mL/min (ref >60.00)
Glucose, Bld: 223 mg/dL — ABNORMAL HIGH (ref 70–99)
POTASSIUM: 3.8 meq/L (ref 3.5–5.1)
SODIUM: 135 meq/L (ref 135–145)
Total Bilirubin: 0.5 mg/dL (ref 0.3–1.2)
Total Protein: 7.8 g/dL (ref 6.0–8.3)

## 2013-08-07 LAB — GLUCOSE, POCT (MANUAL RESULT ENTRY): POC Glucose: 387 mg/dl — AB (ref 70–99)

## 2013-08-07 MED ORDER — INSULIN ASPART 100 UNIT/ML FLEXPEN: PEN_INJECTOR | SUBCUTANEOUS | Status: DC

## 2013-08-07 MED ORDER — INSULIN DETEMIR 100 UNIT/ML FLEXPEN: PEN_INJECTOR | SUBCUTANEOUS | Status: DC

## 2013-08-07 MED ORDER — INSULIN DETEMIR 100 UNIT/ML ~~LOC~~ SOLN: SUBCUTANEOUS | Status: DC

## 2013-08-07 NOTE — Patient Instructions (Signed)
Endocrinology consultation as discussed  Please see your eye doctor yearly to check for diabetic eye damage  You need to lose weight.  Consider a lower calorie diet and regular exercise.

## 2013-08-07 NOTE — Progress Notes (Signed)
Pre-visit discussion using our clinic review tool. No additional management support is needed unless otherwise documented below in the visit note.  

## 2013-08-07 NOTE — Progress Notes (Signed)
   Subjective:    Patient ID: [name], female    DOB: 1960/06/19, 54 y.o.   MRN: [id number]  HPI Wt Readings from Last 3 Encounters:  08/07/13 197 lb (89.359 kg)  02/01/13 213 lb (96.616 kg)  10/10/12 215 lb (97.523 kg)     Review of Systems     Objective:   Physical Exam        Assessment & Plan:

## 2013-08-07 NOTE — Telephone Encounter (Signed)
Relevant patient education mailed to patient.  

## 2013-08-07 NOTE — Progress Notes (Signed)
Subjective:    Patient ID: [name], female    DOB: 03/26/1961, 53 y.o.   MRN: [id number]  HPI   53 year old patient who is seen today as a work in due to uncontrolled diabetes.  She's not been seen here in about 6 months.  More recently blood sugars have been consistently greater than 400.  She remains on a basal bolus regimen, and she states that she has been fairly compliant.  She states at most she misses 2 injections per week.  She states her vision has been slightly cloudy at times, but no significant blurred vision.  She states she did have a brief episode of diplopia yesterday.  That was brief and has resolved.  She does have a history of cerebrovascular disease and prior diplopia.  No recent eye examination.  Had some urinary frequency earlier, but none at present.  She states that she has attempted to eat better and exercise more and has enjoyed some weight loss since the fall.    Past Medical History  Diagnosis Date  . Abnormality of gait 05/10/2010  . BACK PAIN 11/14/2008  . CEREBROVASCULAR DISEASE 08/05/2008  . DIABETES MELLITUS, TYPE II 07/15/2008  . Diplopia 07/15/2008  . ECZEMA, ATOPIC 04/03/2009  . HYPERLIPIDEMIA 03/06/2009  . HYPERTENSION 07/15/2008  . Vertebral artery stenosis   . Stroke     History   Social History  . Marital Status: Single    Spouse Name: N/A    Number of Children: N/A  . Years of Education: N/A   Occupational History  . Not on file.   Social History Main Topics  . Smoking status: Never Smoker   . Smokeless tobacco: Never Used  . Alcohol Use: No  . Drug Use: No  . Sexual Activity: Not on file   Other Topics Concern  . Not on file   Social History Narrative  . No narrative on file    Past Surgical History  Procedure Laterality Date  . Abdominal hysterectomy    . Foot surgery      No family history on file.  No Known Allergies  Current Outpatient Prescriptions on File Prior to Visit  Medication Sig Dispense Refill  .  amLODipine (NORVASC) 10 MG tablet TAKE ONE TABLET BY MOUTH ONCE DAILY  90 tablet  0  . chlorthalidone (HYGROTON) 25 MG tablet TAKE ONE TABLET BY MOUTH ONCE DAILY  90 tablet  1  . clopidogrel (PLAVIX) 75 MG tablet TAKE ONE TABLET BY MOUTH EVERY DAY  90 tablet  1  . FLUoxetine (PROZAC) 20 MG capsule TAKE ONE CAPSULE BY MOUTH ONCE DAILY  90 capsule  3  . glucose blood (ONE TOUCH ULTRA TEST) test strip Use as instructed  100 each  6  . insulin aspart (NOVOLOG FLEXPEN) 100 unit/mL SOLN FlexPen 18 units prior to breakfast and lunch and 26 units prior to your evening meal      . insulin detemir (LEVEMIR) 100 UNIT/ML injection 38 units at bedtime  10 mL  12  . Insulin Pen Needle (BD PEN NEEDLE NANO U/F) 32G X 4 MM MISC 1 each by Does not apply route 4 (four) times daily -  with meals and at bedtime.  100 each  6  . Lancets (ONETOUCH ULTRASOFT) lancets Use as instructed  100 each  6  . lisinopril (PRINIVIL,ZESTRIL) 40 MG tablet TAKE ONE TABLET BY MOUTH EVERY DAY  90 tablet  1  . pantoprazole (PROTONIX) 40 MG tablet TAKE ONE TABLET BY  MOUTH EVERY DAY  90 tablet  1  . pravastatin (PRAVACHOL) 40 MG tablet TAKE ONE TABLET BY MOUTH EVERY DAY  90 tablet  1  . traMADol (ULTRAM) 50 MG tablet Take 1 tablet (50 mg total) by mouth every 8 (eight) hours as needed for pain.  30 tablet  0  . triamcinolone ointment (KENALOG) 0.5 % APPLY  TWICE DAILY  30 g  5   No current facility-administered medications on file prior to visit.    BP 130/70  Pulse 64  Temp(Src) 98.3 F (36.8 C) (Oral)  Resp 20  Ht 5\' 4"  (1.626 m)  Wt 197 lb (89.359 kg)  BMI 33.80 kg/m2  SpO2 98%       Review of Systems  Constitutional: Positive for unexpected weight change.  HENT: Negative for congestion, dental problem, hearing loss, rhinorrhea, sinus pressure, sore throat and tinnitus.   Eyes: Positive for visual disturbance. Negative for pain and discharge.  Respiratory: Negative for cough and shortness of breath.   Cardiovascular:  Negative for chest pain, palpitations and leg swelling.  Gastrointestinal: Negative for nausea, vomiting, abdominal pain, diarrhea, constipation, blood in stool and abdominal distention.  Genitourinary: Positive for frequency. Negative for dysuria, urgency, hematuria, flank pain, vaginal bleeding, vaginal discharge, difficulty urinating, vaginal pain and pelvic pain.  Musculoskeletal: Negative for arthralgias, gait problem and joint swelling.  Skin: Negative for rash.  Neurological: Negative for dizziness, syncope, speech difficulty, weakness, numbness and headaches.  Hematological: Negative for adenopathy.  Psychiatric/Behavioral: Negative for behavioral problems, dysphoric mood and agitation. The patient is not nervous/anxious.        Objective:   Physical Exam  Constitutional: She is oriented to person, place, and time. She appears well-developed and well-nourished.  HENT:  Head: Normocephalic.  Right Ear: External ear normal.  Left Ear: External ear normal.  Mouth/Throat: Oropharynx is clear and moist.  Eyes: Conjunctivae and EOM are normal. Pupils are equal, round, and reactive to light.  Neck: Normal range of motion. Neck supple. No thyromegaly present.  Cardiovascular: Normal rate, regular rhythm, normal heart sounds and intact distal pulses.   Pulmonary/Chest: Effort normal and breath sounds normal.  Abdominal: Soft. Bowel sounds are normal. She exhibits no mass. There is no tenderness.  Musculoskeletal: Normal range of motion.  Lymphadenopathy:    She has no cervical adenopathy.  Neurological: She is alert and oriented to person, place, and time.  Skin: Skin is warm and dry. No rash noted.  Psychiatric: She has a normal mood and affect. Her behavior is normal.          Assessment & Plan:   Uncontrolled diabetes.  Will intensify insulin and set up to see endocrinology Hypertension stable Cerebrovascular disease Dyslipidemia.  Continue statin therapy Weight loss.   Unclear whether this is voluntary or secondary to poorly controlled diabetes

## 2013-08-13 ENCOUNTER — Other Ambulatory Visit: Payer: Self-pay | Admitting: Internal Medicine

## 2013-08-19 ENCOUNTER — Telehealth: Payer: Self-pay | Admitting: *Deleted

## 2013-08-19 ENCOUNTER — Ambulatory Visit: Payer: Commercial Managed Care - PPO | Admitting: Internal Medicine

## 2013-08-19 NOTE — Telephone Encounter (Signed)
Left detailed message missed appointment today at 8:15, need to reschedule follow up.

## 2013-08-20 NOTE — Telephone Encounter (Signed)
Left detailed message that pt needs to schedule follow up in one month from 4/8 visit per Dr. Raliegh Ip.

## 2013-08-23 ENCOUNTER — Telehealth: Payer: Self-pay

## 2013-08-23 NOTE — Telephone Encounter (Signed)
Relevant patient education mailed to patient.  

## 2013-09-06 ENCOUNTER — Ambulatory Visit: Payer: Commercial Managed Care - PPO | Admitting: Internal Medicine

## 2013-09-20 ENCOUNTER — Other Ambulatory Visit: Payer: Self-pay | Admitting: Internal Medicine

## 2013-10-04 ENCOUNTER — Ambulatory Visit: Payer: Commercial Managed Care - PPO | Admitting: Internal Medicine

## 2013-10-28 ENCOUNTER — Other Ambulatory Visit: Payer: Self-pay | Admitting: Internal Medicine

## 2013-10-28 ENCOUNTER — Other Ambulatory Visit: Payer: Self-pay | Admitting: *Deleted

## 2013-10-28 ENCOUNTER — Ambulatory Visit (INDEPENDENT_AMBULATORY_CARE_PROVIDER_SITE_OTHER): Payer: Commercial Managed Care - PPO | Admitting: Internal Medicine

## 2013-10-28 ENCOUNTER — Encounter: Payer: Self-pay | Admitting: Internal Medicine

## 2013-10-28 VITALS — BP 142/80 | HR 67 | Temp 98.7°F | Resp 20 | Ht 64.0 in | Wt 200.0 lb

## 2013-10-28 DIAGNOSIS — E785 Hyperlipidemia, unspecified: Secondary | ICD-10-CM

## 2013-10-28 DIAGNOSIS — I679 Cerebrovascular disease, unspecified: Secondary | ICD-10-CM

## 2013-10-28 DIAGNOSIS — E119 Type 2 diabetes mellitus without complications: Secondary | ICD-10-CM

## 2013-10-28 DIAGNOSIS — I10 Essential (primary) hypertension: Secondary | ICD-10-CM

## 2013-10-28 MED ORDER — INSULIN DETEMIR 100 UNIT/ML FLEXPEN: PEN_INJECTOR | SUBCUTANEOUS | Status: DC

## 2013-10-28 NOTE — Progress Notes (Signed)
Pre-visit discussion using our clinic review tool. No additional management support is needed unless otherwise documented below in the visit note.  

## 2013-10-28 NOTE — Progress Notes (Signed)
Subjective:    Patient ID: [name], female    DOB: [birth year], 53 y.o.   MRN: [id number]  HPI  53 year old patient who is seen today for followup of type 2 diabetes, hypertension, dyslipidemia.  She also has a history of 3 Broviac for disease. She was seen 2 months ago, and hemoglobin A1c was 14 point 5. Insulin therapy at uptitrated, and she failed to return for followup in one month.  She states she has been multiple medications since the weekend. She states that she began having nocturnal diaphoresis and has had down titrated Levemir  insulin from 46 to 38 units.  She did not check any blood sugars, but diaphoresis.  It resolved with down titration.  She also did not check any fasting blood sugars following episodes of nocturnal diaphoresis. At the present time.  She is taking 20 units of NovoLog prior to breakfast and lunch and 26 units prior to her evening meal.  This is less than the recommended Up titration of her insulin. She states fasting blood sugars are generally between 260 and 300.  She was referred to endocrinology 2 months ago, but has not yet been evaluated. No recent eye exam. 2 months ago, she had some significant weight loss , possibly secondary to the poorly controlled diabetes.  Presently, weight has stabilized and is up 2 pounds. Denies any focal neurological symptoms.  Past Medical History  Diagnosis Date  . Abnormality of gait 05/10/2010  . BACK PAIN 11/14/2008  . CEREBROVASCULAR DISEASE 08/05/2008  . DIABETES MELLITUS, TYPE II 07/15/2008  . Diplopia 07/15/2008  . ECZEMA, ATOPIC 04/03/2009  . HYPERLIPIDEMIA 03/06/2009  . HYPERTENSION 07/15/2008  . Vertebral artery stenosis   . Stroke     History   Social History  . Marital Status: Single    Spouse Name: N/A    Number of Children: N/A  . Years of Education: N/A   Occupational History  . Not on file.   Social History Main Topics  . Smoking status: Never Smoker   . Smokeless tobacco: Never Used  .  Alcohol Use: No  . Drug Use: No  . Sexual Activity: Not on file   Other Topics Concern  . Not on file   Social History Narrative  . No narrative on file    Past Surgical History  Procedure Laterality Date  . Abdominal hysterectomy    . Foot surgery      No family history on file.  No Known Allergies  Current Outpatient Prescriptions on File Prior to Visit  Medication Sig Dispense Refill  . chlorthalidone (HYGROTON) 25 MG tablet TAKE ONE TABLET BY MOUTH ONCE DAILY  90 tablet  1  . clopidogrel (PLAVIX) 75 MG tablet TAKE ONE TABLET BY MOUTH ONCE DAILY  90 tablet  0  . FLUoxetine (PROZAC) 20 MG capsule TAKE ONE CAPSULE BY MOUTH ONCE DAILY  90 capsule  3  . glucose blood (ONE TOUCH ULTRA TEST) test strip Use as instructed  100 each  6  . insulin aspart (NOVOLOG FLEXPEN) 100 UNIT/ML FlexPen 26 units prior to breakfast and lunch, and 30 units prior to your evening meal  15 mL  11  . Insulin Detemir (LEVEMIR) 100 UNIT/ML Pen 46 units at bedtime  15 mL  11  . Insulin Pen Needle (BD PEN NEEDLE NANO U/F) 32G X 4 MM MISC 1 each by Does not apply route 4 (four) times daily -  with meals and at bedtime.  100 each  6  . Lancets (ONETOUCH ULTRASOFT) lancets Use as instructed  100 each  6  . lisinopril (PRINIVIL,ZESTRIL) 40 MG tablet TAKE ONE TABLET BY MOUTH EVERY DAY  90 tablet  1  . pantoprazole (PROTONIX) 40 MG tablet TAKE ONE TABLET BY MOUTH ONCE DAILY  90 tablet  3  . pravastatin (PRAVACHOL) 40 MG tablet TAKE ONE TABLET BY MOUTH EVERY DAY  90 tablet  1  . traMADol (ULTRAM) 50 MG tablet Take 1 tablet (50 mg total) by mouth every 8 (eight) hours as needed for pain.  30 tablet  0  . triamcinolone ointment (KENALOG) 0.5 % APPLY  TWICE DAILY  30 g  5   No current facility-administered medications on file prior to visit.    BP 142/80  Pulse 67  Temp(Src) 98.7 F (37.1 C) (Oral)  Resp 20  Ht 5\' 4"  (1.626 m)  Wt 200 lb (90.719 kg)  BMI 34.31 kg/m2  SpO2 98%        Review of  Systems  Constitutional: Negative.   HENT: Negative for congestion, dental problem, hearing loss, rhinorrhea, sinus pressure, sore throat and tinnitus.   Eyes: Negative for pain, discharge and visual disturbance.  Respiratory: Negative for cough and shortness of breath.   Cardiovascular: Negative for chest pain, palpitations and leg swelling.  Gastrointestinal: Negative for nausea, vomiting, abdominal pain, diarrhea, constipation, blood in stool and abdominal distention.  Genitourinary: Negative for dysuria, urgency, frequency, hematuria, flank pain, vaginal bleeding, vaginal discharge, difficulty urinating, vaginal pain and pelvic pain.  Musculoskeletal: Negative for arthralgias, gait problem and joint swelling.  Skin: Negative for rash.  Neurological: Negative for dizziness, syncope, speech difficulty, weakness, numbness and headaches.  Hematological: Negative for adenopathy.  Psychiatric/Behavioral: Negative for behavioral problems, dysphoric mood and agitation. The patient is not nervous/anxious.        Objective:   Physical Exam  Constitutional: She is oriented to person, place, and time. She appears well-developed and well-nourished.  HENT:  Head: Normocephalic.  Right Ear: External ear normal.  Left Ear: External ear normal.  Mouth/Throat: Oropharynx is clear and moist.  Eyes: Conjunctivae and EOM are normal. Pupils are equal, round, and reactive to light.  Neck: Normal range of motion. Neck supple. No thyromegaly present.  Cardiovascular: Normal rate, regular rhythm, normal heart sounds and intact distal pulses.   Pulmonary/Chest: Effort normal and breath sounds normal.  Abdominal: Soft. Bowel sounds are normal. She exhibits no mass. There is no tenderness.  Musculoskeletal: Normal range of motion.  Lymphadenopathy:    She has no cervical adenopathy.  Neurological: She is alert and oriented to person, place, and time.  Skin: Skin is warm and dry. No rash noted.  Psychiatric:  She has a normal mood and affect. Her behavior is normal.          Assessment & Plan:  Diabetes mellitus.  Poor control.  We'll set up for endocrinology evaluation Hypertension stable.  Repeat blood pressure 124/80 Dyslipidemia.  Continue statin therapy Cerebrovascular disease  Eye  examination and encouraged Follow up with endocrinology.  Strongly recommended

## 2013-10-28 NOTE — Patient Instructions (Signed)
Please followup with the diabetic specialist as discussed  Limit your sodium (Salt) intake    It is important that you exercise regularly, at least 20 minutes 3 to 4 times per week.  If you develop chest pain or shortness of breath seek  medical attention.  Return in 3 months for follow-up   Please check your hemoglobin A1c every 3 months  Please see your eye doctor yearly to check for diabetic eye damage  Glucose, Blood Sugar, Fasting Blood Sugar This is a test to measure your blood sugar. Glucose is a simple sugar that serves as the main source of energy for the body. The carbohydrates we eat are broken down into glucose (and a few other simple sugars), absorbed by the small intestine, and circulated throughout the body. Most of the body's cells require glucose for energy production; brain and nervous system cells not only rely on glucose for energy, they can only function when glucose levels in the blood remain above a certain level.  The body's use of glucose hinges on the availability of insulin, a hormone produced by the pancreas. Insulin acts as a Control and instrumentation engineer, transporting glucose into the body's cells, directing the body to store excess glucose as glycogen (for short-term storage) and/or as triglycerides in fat cells. We can not live without glucose or insulin, and they must be in balance.  Normally, blood glucose levels rise slightly after a meal, and insulin is secreted to lower them, with the amount of insulin released matched up with the size and content of the meal. If blood glucose levels drop too low, such as might occur in between meals or after a strenuous workout, glucagon (another pancreatic hormone) is secreted to tell the liver to turn some glycogen back into glucose, raising the blood glucose levels. If the glucose/insulin feedback mechanism is working properly, the amount of glucose in the blood remains fairly stable. If the balance is disrupted and glucose levels in the  blood rise, then the body tries to restore the balance, both by increasing insulin production and by excreting glucose in the urine.  PREPARATION FOR TEST A blood sample drawn from a vein in your arm or, for a self check, a drop of blood from a skin prick; in general, it may be recommended that you fast before having a blood glucose test; sometimes a random (no preparation) urine sample is used. Your caregiver will instruct you as to what they want prior to your testing. NORMAL FINDINGS Normal values depend on many factors. Your lab will provide a range of normal values with your test results. The following information summarizes the meaning of the test results. These are based on the clinical practice recommendations of the American Diabetes Association.  FASTING BLOOD GLUCOSE  From 70 to 99 mg/dL (3.9 to 5.5 mmol/L): Normal glucose tolerance  From 100 to 125 mg/dL (5.6 to 6.9 mmol/L):Impaired fasting glucose (pre-diabetes)  126 mg/dL (7.0 mmol/L) and above on more than one testing occasion: Diabetes ORAL GLUCOSE TOLERANCE TEST (OGTT) [EXCEPT PREGNANCY] (2 HOURS AFTER A 75-GRAM GLUCOSE DRINK)  Less than 140 mg/dL (7.8 mmol/L): Normal glucose tolerance  From 140 to 200 mg/dL (7.8 to 11.1 mmol/L): Impaired glucose tolerance (pre-diabetes)  Over 200 mg/dL (11.1 mmol/L) on more than one testing occasion: Diabetes GESTATIONAL DIABETES SCREENING: GLUCOSE CHALLENGE TEST (1 HOUR AFTER A 50-GRAM GLUCOSE DRINK)  Less than 140* mg/dL (7.8 mmol/L): Normal glucose tolerance  140* mg/dL (7.8 mmol/L) and over: Abnormal, needs OGTT (see below) * Some  use a cutoff of More Than 130 mg/dL (7.2 mmol/L) because that identifies 90% of women with gestational diabetes, compared to 80% identified using the threshold of More Than 140 mg/dL (7.8 mmol/L). GESTATIONAL DIABETES DIAGNOSTIC: OGTT (100-GRAM GLUCOSE DRINK)  Fasting*..........................................95 mg/dL (5.3 mmol/L)  1 hour after glucose  load*..............180 mg/dL (10.0 mmol/L)  2 hours after glucose load*.............155 mg/dL (8.6 mmol/L)  3 hours after glucose load* **.........140 mg/dL (7.8 mmol/L) * If two or more values are above the criteria, gestational diabetes is diagnosed. ** A 75-gram glucose load may be used, although this method is not as well validated as the 100-gram OGTT; the 3-hour sample is not drawn if 75 grams is used.  Ranges for normal findings may vary among different laboratories and hospitals. You should always check with your doctor after having lab work or other tests done to discuss the meaning of your test results and whether your values are considered within normal limits. MEANING OF TEST  Your caregiver will go over the test results with you and discuss the importance and meaning of your results, as well as treatment options and the need for additional tests if necessary. OBTAINING THE TEST RESULTS It is your responsibility to obtain your test results. Ask the lab or department performing the test when and how you will get your results. Document Released: 05/20/2004 Document Revised: 07/11/2011 Document Reviewed: 03/29/2008 [facility] Patient Information 2015 [place], [place]. This information is not intended to replace advice given to you by your health care provider. Make sure you discuss any questions you have with your health care provider.

## 2013-11-08 ENCOUNTER — Telehealth: Payer: Self-pay | Admitting: Internal Medicine

## 2013-11-08 NOTE — Telephone Encounter (Addendum)
lmom for pt to cb. Pt needs diabetic bundle alc,ldl. Pt also needs follow up with md for dm. lmom again for pt to cb needs to know if dr Loanne Drilling follows pt for a1c and ldl

## 2013-11-12 NOTE — Telephone Encounter (Signed)
Pt has appt with dr Loanne Drilling on 11/22/13 and will ask him will he do her lab work a1c, and ldl. Pt will call back

## 2013-11-15 ENCOUNTER — Ambulatory Visit: Payer: Commercial Managed Care - PPO | Admitting: Endocrinology

## 2013-11-22 ENCOUNTER — Ambulatory Visit (INDEPENDENT_AMBULATORY_CARE_PROVIDER_SITE_OTHER): Payer: Commercial Managed Care - PPO | Admitting: Endocrinology

## 2013-11-22 ENCOUNTER — Encounter: Payer: Self-pay | Admitting: Endocrinology

## 2013-11-22 VITALS — BP 126/78 | HR 71 | Temp 98.4°F | Ht 64.0 in | Wt 199.0 lb

## 2013-11-22 DIAGNOSIS — E1065 Type 1 diabetes mellitus with hyperglycemia: Principal | ICD-10-CM

## 2013-11-22 DIAGNOSIS — E1049 Type 1 diabetes mellitus with other diabetic neurological complication: Secondary | ICD-10-CM

## 2013-11-22 LAB — MICROALBUMIN / CREATININE URINE RATIO
Creatinine,U: 25.8 mg/dL
MICROALB UR: 0.2 mg/dL (ref 0.0–1.9)
Microalb Creat Ratio: 0.8 mg/g (ref 0.0–30.0)

## 2013-11-22 LAB — HEMOGLOBIN A1C: HEMOGLOBIN A1C: 13.9 % — AB (ref 4.6–6.5)

## 2013-11-22 MED ORDER — INSULIN DETEMIR 100 UNIT/ML FLEXPEN: 90.0000 [IU] | PEN_INJECTOR | SUBCUTANEOUS | Status: DC

## 2013-11-22 NOTE — Patient Instructions (Addendum)
good diet and exercise habits significanly improve the control of your diabetes.  please let me know if you wish to be referred to a dietician.  high blood sugar is very risky to your health.  you should see an eye doctor and dentist every year.  You are at higher than average risk for pneumonia and hepatitis-B.  You should be vaccinated against both.   controlling your blood pressure and cholesterol drastically reduces the damage diabetes does to your body.  this also applies to quitting smoking.  please discuss these with your doctor.  check your blood sugar twice a day.  vary the time of day when you check, between before the 3 meals, and at bedtime.  also check if you have symptoms of your blood sugar being too high or too low.  please keep a record of the readings and bring it to your next appointment here.  You can write it on any piece of paper.  please call us sooner if your blood sugar goes below 70, or if you have a lot of readings over 200. Here are 2 new meters.  i have sent a prescription to your pharmacy, for strips.   Blood and urine tests are requested for you today.  We'll contact you with results. For now, please stop taking the novolog, and take just levemir, 90 units each morning.  i have sent a prescription to your pharmacy Please come back for a follow-up appointment in 1 month.

## 2013-11-22 NOTE — Progress Notes (Signed)
Subjective:    Patient ID: [name], female    DOB: January 02, 1961, 53 y.o.   MRN: [id number]  HPI pt states DM was dx'ed in 2010; she has mild if any neuropathy of the lower extremities, but she has associated cerebrovascular disease; he has been on insulin since soon after dx; pt says her diet and exercise are "ok."   she has never had GDM, pancreatitis, severe hypoglycemia or DKA.  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  She wants to take just 1 injection per day, as she is having difficulty remembering the multiple daily injections.    Past Medical History  Diagnosis Date  . Abnormality of gait 05/10/2010  . BACK PAIN 11/14/2008  . CEREBROVASCULAR DISEASE 08/05/2008  . DIABETES MELLITUS, TYPE II 07/15/2008  . Diplopia 07/15/2008  . ECZEMA, ATOPIC 04/03/2009  . HYPERLIPIDEMIA 03/06/2009  . HYPERTENSION 07/15/2008  . Vertebral artery stenosis   . Stroke     Past Surgical History  Procedure Laterality Date  . Abdominal hysterectomy    . Foot surgery      History   Social History  . Marital Status: Single    Spouse Name: N/A    Number of Children: N/A  . Years of Education: N/A   Occupational History  . Not on file.   Social History Main Topics  . Smoking status: Never Smoker   . Smokeless tobacco: Never Used  . Alcohol Use: No  . Drug Use: No  . Sexual Activity: Not on file   Other Topics Concern  . Not on file   Social History Narrative  . No narrative on file    Current Outpatient Prescriptions on File Prior to Visit  Medication Sig Dispense Refill  . amLODipine (NORVASC) 10 MG tablet TAKE ONE TABLET BY MOUTH ONCE DAILY  90 tablet  1  . chlorthalidone (HYGROTON) 25 MG tablet TAKE ONE TABLET BY MOUTH ONCE DAILY  90 tablet  1  . clopidogrel (PLAVIX) 75 MG tablet TAKE ONE TABLET BY MOUTH ONCE DAILY  90 tablet  0  . FLUoxetine (PROZAC) 20 MG capsule TAKE ONE CAPSULE BY MOUTH ONCE DAILY  90 capsule  3  . Insulin Pen Needle (BD PEN NEEDLE NANO U/F) 32G X 4 MM MISC 1  each by Does not apply route 4 (four) times daily -  with meals and at bedtime.  100 each  6  . Lancets (ONETOUCH ULTRASOFT) lancets Use as instructed  100 each  6  . lisinopril (PRINIVIL,ZESTRIL) 40 MG tablet TAKE ONE TABLET BY MOUTH EVERY DAY  90 tablet  1  . pantoprazole (PROTONIX) 40 MG tablet TAKE ONE TABLET BY MOUTH ONCE DAILY  90 tablet  3  . pravastatin (PRAVACHOL) 40 MG tablet TAKE ONE TABLET BY MOUTH EVERY DAY  90 tablet  1  . traMADol (ULTRAM) 50 MG tablet Take 1 tablet (50 mg total) by mouth every 8 (eight) hours as needed for pain.  30 tablet  0  . triamcinolone ointment (KENALOG) 0.5 % APPLY  TWICE DAILY  30 g  5   No current facility-administered medications on file prior to visit.    No Known Allergies  Family History  Problem Relation Age of Onset  . Diabetes Sister     BP 126/78  Pulse 71  Temp(Src) 98.4 F (36.9 C) (Oral)  Ht 5\' 4"  (1.626 m)  Wt 199 lb (90.266 kg)  BMI 34.14 kg/m2  SpO2 96%  Review of Systems denies blurry  vision, headache, chest pain, sob, n/v, urinary frequency, excessive diaphoresis, cold intolerance, rhinorrhea, and easy bruising.  She has lost a few lbs.  She has leg cramps and excessive diaphoresis.  Depression is well-controlled    Objective:   Physical Exam VS: see vs page GEN: no distress HEAD: head: no deformity eyes: no periorbital swelling, no proptosis external nose and ears are normal mouth: no lesion seen NECK: a healed (tracheostomy) scar is present.  i do not appreciate a nodule in the thyroid or elsewhere in the neck.   CHEST WALL: no deformity. LUNGS:  Clear to auscultation. CV: reg rate and rhythm, no murmur. ABD: abdomen is soft, nontender.  no hepatosplenomegaly.  not distended.  no hernia MUSCULOSKELETAL: muscle bulk and strength are grossly normal.  no obvious joint swelling.  gait is normal and steady EXTEMITIES: no deformity.  no ulcer on the feet.  feet are of normal color and temp.  no edema PULSES: dorsalis  pedis intact bilat.  no carotid bruit. NEURO:  cn 2-12 grossly intact.   readily moves all 4's.  sensation is intact to touch on the feet.   SKIN:  Normal texture and temperature.  No rash or suspicious lesion is visible.   NODES:  None palpable at the neck.  PSYCH: alert, well-oriented.  Does not appear anxious nor depressed.   Lab Results  Component Value Date   HGBA1C 14.5 Repeated and verified X2.* 08/07/2013   i reviewed electrocardiogram (2011)  i have reviewed the following outside records: Office notes    Assessment & Plan:  DM: severe exacerbation Noncompliance with cbg recording and insulin injections: I'll work around this as best I can.  He needs a simpler regimen.   Weight-loss, new to me: prob due to severe hyperglycemia.      Patient is advised the following: Patient Instructions  good diet and exercise habits significanly improve the control of your diabetes.  please let me know if you wish to be referred to a dietician.  high blood sugar is very risky to your health.  you should see an eye doctor and dentist every year.  You are at higher than average risk for pneumonia and hepatitis-B.  You should be vaccinated against both.   controlling your blood pressure and cholesterol drastically reduces the damage diabetes does to your body.  this also applies to quitting smoking.  please discuss these with your doctor.  check your blood sugar twice a day.  vary the time of day when you check, between before the 3 meals, and at bedtime.  also check if you have symptoms of your blood sugar being too high or too low.  please keep a record of the readings and bring it to your next appointment here.  You can write it on any piece of paper.  please call us sooner if your blood sugar goes below 70, or if you have a lot of readings over 200. Here are 2 new meters.  i have sent a prescription to your pharmacy, for strips.   Blood and urine tests are requested for you today.  We'll contact  you with results. For now, please stop taking the novolog, and take just levemir, 90 units each morning.  i have sent a prescription to your pharmacy Please come back for a follow-up appointment in 1 month.

## 2013-11-27 ENCOUNTER — Telehealth: Payer: Self-pay | Admitting: *Deleted

## 2013-11-27 DIAGNOSIS — E785 Hyperlipidemia, unspecified: Secondary | ICD-10-CM

## 2013-11-27 NOTE — Telephone Encounter (Signed)
Left message on machine for patient to come to the lab for a cholesterol check.  It may also be time for physical Lipid panel ordered Diabetic bundle

## 2013-12-06 NOTE — Telephone Encounter (Signed)
Appointment made

## 2013-12-13 ENCOUNTER — Other Ambulatory Visit (INDEPENDENT_AMBULATORY_CARE_PROVIDER_SITE_OTHER): Payer: Commercial Managed Care - PPO

## 2013-12-13 DIAGNOSIS — E785 Hyperlipidemia, unspecified: Secondary | ICD-10-CM

## 2013-12-13 LAB — LIPID PANEL
CHOL/HDL RATIO: 5
Cholesterol: 231 mg/dL — ABNORMAL HIGH (ref 0–200)
HDL: 42.9 mg/dL (ref >39.00)
LDL CALC: 158 mg/dL — AB (ref 0–99)
NONHDL: 188.1
Triglycerides: 153 mg/dL — ABNORMAL HIGH (ref 0.0–149.0)
VLDL: 30.6 mg/dL (ref 0.0–40.0)

## 2014-01-08 ENCOUNTER — Encounter: Payer: Self-pay | Admitting: Gastroenterology

## 2014-01-17 ENCOUNTER — Other Ambulatory Visit: Payer: Self-pay | Admitting: Internal Medicine

## 2014-01-17 ENCOUNTER — Telehealth: Payer: Self-pay | Admitting: Internal Medicine

## 2014-01-17 MED ORDER — LISINOPRIL 40 MG PO TABS: ORAL_TABLET | ORAL | Status: DC

## 2014-01-17 MED ORDER — PRAVASTATIN SODIUM 40 MG PO TABS: ORAL_TABLET | ORAL | Status: DC

## 2014-01-17 MED ORDER — CLOPIDOGREL BISULFATE 75 MG PO TABS: ORAL_TABLET | ORAL | Status: DC

## 2014-01-17 MED ORDER — FLUOXETINE HCL 20 MG PO CAPS: ORAL_CAPSULE | ORAL | Status: DC

## 2014-01-17 NOTE — Telephone Encounter (Signed)
Left detailed message Rx's sent to pharmacy as requested. 

## 2014-01-17 NOTE — Telephone Encounter (Signed)
Pt request refill of the following:lisinopril (PRINIVIL,ZESTRIL) 40 MG tablet,clopidogrel (PLAVIX) 75 MG tablet, FLUoxetine (PROZAC) 20 MG capsule, pravastatin (PRAVACHOL) 40 MG tablet   Pt said she need this called in today     Phamacy:  [facility]

## 2014-03-01 ENCOUNTER — Other Ambulatory Visit: Payer: Self-pay | Admitting: Internal Medicine

## 2014-05-19 ENCOUNTER — Encounter (HOSPITAL_COMMUNITY): Payer: Self-pay | Admitting: Emergency Medicine

## 2014-05-19 ENCOUNTER — Emergency Department (HOSPITAL_COMMUNITY)
Admission: EM | Admit: 2014-05-19 | Discharge: 2014-05-19 | Disposition: A | Discharge status: Home / Self Care | Payer: Commercial Managed Care - PPO | Attending: Emergency Medicine | Admitting: Emergency Medicine

## 2014-05-19 DIAGNOSIS — Z8673 Personal history of transient ischemic attack (TIA), and cerebral infarction without residual deficits: Secondary | ICD-10-CM | POA: Insufficient documentation

## 2014-05-19 DIAGNOSIS — E1165 Type 2 diabetes mellitus with hyperglycemia: Secondary | ICD-10-CM | POA: Insufficient documentation

## 2014-05-19 DIAGNOSIS — Z872 Personal history of diseases of the skin and subcutaneous tissue: Secondary | ICD-10-CM | POA: Insufficient documentation

## 2014-05-19 DIAGNOSIS — Z7902 Long term (current) use of antithrombotics/antiplatelets: Secondary | ICD-10-CM | POA: Diagnosis not present

## 2014-05-19 DIAGNOSIS — R200 Anesthesia of skin: Secondary | ICD-10-CM | POA: Diagnosis present

## 2014-05-19 DIAGNOSIS — Z79899 Other long term (current) drug therapy: Secondary | ICD-10-CM | POA: Diagnosis not present

## 2014-05-19 DIAGNOSIS — Z8669 Personal history of other diseases of the nervous system and sense organs: Secondary | ICD-10-CM | POA: Diagnosis not present

## 2014-05-19 DIAGNOSIS — Z794 Long term (current) use of insulin: Secondary | ICD-10-CM | POA: Insufficient documentation

## 2014-05-19 DIAGNOSIS — E785 Hyperlipidemia, unspecified: Secondary | ICD-10-CM | POA: Diagnosis not present

## 2014-05-19 DIAGNOSIS — R739 Hyperglycemia, unspecified: Secondary | ICD-10-CM

## 2014-05-19 DIAGNOSIS — I10 Essential (primary) hypertension: Secondary | ICD-10-CM | POA: Diagnosis not present

## 2014-05-19 LAB — I-STAT CHEM 8, ED
BUN: 19 mg/dL (ref 6–23)
CHLORIDE: 96 meq/L (ref 96–112)
CREATININE: 0.8 mg/dL (ref 0.50–1.10)
Calcium, Ion: 1.25 mmol/L — ABNORMAL HIGH (ref 1.12–1.23)
Glucose, Bld: 421 mg/dL — ABNORMAL HIGH (ref 70–99)
HCT: 44 % (ref 36.0–46.0)
HEMOGLOBIN: 15 g/dL (ref 12.0–15.0)
Potassium: 3.8 mmol/L (ref 3.5–5.1)
SODIUM: 137 mmol/L (ref 135–145)
TCO2: 26 mmol/L (ref 0–100)

## 2014-05-19 LAB — URINALYSIS, ROUTINE W REFLEX MICROSCOPIC
BILIRUBIN URINE: NEGATIVE
Glucose, UA: >1000 mg/dL — AB
HGB URINE DIPSTICK: NEGATIVE
Ketones, ur: NEGATIVE mg/dL
NITRITE: NEGATIVE
PH: 6.5 (ref 5.0–8.0)
Protein, ur: NEGATIVE mg/dL
SPECIFIC GRAVITY, URINE: 1.026 (ref 1.005–1.030)
Urobilinogen, UA: 1 mg/dL (ref 0.0–1.0)

## 2014-05-19 LAB — CBG MONITORING, ED
GLUCOSE-CAPILLARY: 383 mg/dL — AB (ref 70–99)
Glucose-Capillary: 303 mg/dL — ABNORMAL HIGH (ref 70–99)

## 2014-05-19 LAB — URINE MICROSCOPIC-ADD ON

## 2014-05-19 MED ORDER — INSULIN ASPART 100 UNIT/ML ~~LOC~~ SOLN
10.0000 [IU] | Freq: Once | SUBCUTANEOUS | Status: AC
  Administered 2014-05-19: 10 [IU] via SUBCUTANEOUS
  Filled 2014-05-19: qty 1

## 2014-05-19 NOTE — ED Notes (Signed)
CBG 303; give insulin per MD.

## 2014-05-19 NOTE — ED Provider Notes (Signed)
CSN: [id number]     Arrival date & time 05/19/14  1810 History   First MD Initiated Contact with Patient 05/19/14 1819     Chief Complaint  Patient presents with  . Numbness     (Consider location/radiation/quality/duration/timing/severity/associated sxs/prior Treatment) HPI Patient presents with concern of right leg dysesthesia. It seems as though over the past 2 days the patient has had change in sensation throughout the right leg, primarily in the right foot. She denies pain in the leg, nor other changes in any of her extremities. She denies new difficult to breathing or abdominal discomfort. She denies new lightheadedness, syncope, visual changes. Patient has not been taking her medication, including her insulin, nor checking her blood sugar recently.  Past Medical History  Diagnosis Date  . Abnormality of gait 05/10/2010  . BACK PAIN 11/14/2008  . CEREBROVASCULAR DISEASE 08/05/2008  . DIABETES MELLITUS, TYPE II 07/15/2008  . Diplopia 07/15/2008  . ECZEMA, ATOPIC 04/03/2009  . HYPERLIPIDEMIA 03/06/2009  . HYPERTENSION 07/15/2008  . Vertebral artery stenosis   . Stroke    Past Surgical History  Procedure Laterality Date  . Abdominal hysterectomy    . Foot surgery     Family History  Problem Relation Age of Onset  . Diabetes Sister    History  Substance Use Topics  . Smoking status: Never Smoker   . Smokeless tobacco: Never Used  . Alcohol Use: No   OB History    No data available     Review of Systems  Constitutional:       Per HPI, otherwise negative  HENT:       Per HPI, otherwise negative  Eyes:       No change in baseline visual blurriness for a long time  Respiratory:       Per HPI, otherwise negative  Cardiovascular:       Per HPI, otherwise negative  Gastrointestinal: Negative for vomiting.  Endocrine:       Negative aside from HPI  Genitourinary:       Neg aside from HPI   Musculoskeletal:       Per HPI, otherwise negative  Skin: Negative.    Neurological: Negative for syncope.      Allergies  Review of patient's allergies indicates no known allergies.  Home Medications   Prior to Admission medications   Medication Sig Start Date End Date Taking? Authorizing Provider  amLODipine (NORVASC) 10 MG tablet TAKE ONE TABLET BY MOUTH ONCE DAILY 10/28/13   [name], MD  chlorthalidone (HYGROTON) 25 MG tablet TAKE ONE TABLET BY MOUTH ONCE DAILY 03/03/14   [name], MD  clopidogrel (PLAVIX) 75 MG tablet TAKE ONE TABLET BY MOUTH ONCE DAILY 01/17/14   [name], MD  FLUoxetine (PROZAC) 20 MG capsule TAKE ONE CAPSULE BY MOUTH ONCE DAILY 01/17/14   [name], MD  glucose blood test strip 1 each by Other route 2 (two) times daily. 10/10/11   [name], MD  Insulin Detemir (LEVEMIR FLEXTOUCH) 100 UNIT/ML Pen Inject 90 Units into the skin every morning. And lancets 2/day 11/22/13   [name], MD  Insulin Pen Needle (BD PEN NEEDLE NANO U/F) 32G X 4 MM MISC 1 each by Does not apply route 4 (four) times daily -  with meals and at bedtime. 10/10/11   [name], MD  Lancets [facility] ULTRASOFT) lancets Use as instructed 10/10/11   [name], MD  lisinopril (PRINIVIL,ZESTRIL) 40 MG tablet TAKE  ONE TABLET BY MOUTH EVERY DAY 01/17/14   [name], MD  pantoprazole (PROTONIX) 40 MG tablet TAKE ONE TABLET BY MOUTH ONCE DAILY    [name], MD  pravastatin (PRAVACHOL) 40 MG tablet TAKE ONE TABLET BY MOUTH EVERY DAY 01/17/14   [name], MD  traMADol (ULTRAM) 50 MG tablet Take 1 tablet (50 mg total) by mouth every 8 (eight) hours as needed for pain. 08/24/12   [name], MD  triamcinolone ointment (KENALOG) 0.5 % APPLY  TWICE DAILY 08/13/11   [name], MD   BP 132/89 mmHg  Pulse 66  Temp(Src) 98 F (36.7 C) (Oral)  Resp 18  Ht 5\' 5"  (1.651 m)  Wt 198 lb (89.812 kg)  BMI 32.95 kg/m2  SpO2 96% Physical Exam  Constitutional: She is  oriented to person, place, and time. She appears well-developed and well-nourished. No distress.  HENT:  Head: Normocephalic and atraumatic.  Eyes: Conjunctivae and EOM are normal.  Cardiovascular: Normal rate and regular rhythm.   Pulmonary/Chest: Effort normal and breath sounds normal. No stridor. No respiratory distress.  Abdominal: She exhibits no distension.  Musculoskeletal: She exhibits no edema.  Neurological: She is alert and oriented to person, place, and time. She displays no atrophy and no tremor. No cranial nerve deficit or sensory deficit. She exhibits normal muscle tone. She displays no seizure activity. Coordination normal.  Patient laughing with exam of LE reflexes, which are normal  Skin: Skin is warm and dry.  Psychiatric: She has a normal mood and affect.  Nursing note and vitals reviewed.   ED Course  Procedures (including critical care time) Labs Review Labs Reviewed  CBG MONITORING, ED - Abnormal; Notable for the following:    Glucose-Capillary 383 (*)    All other components within normal limits  URINALYSIS, ROUTINE W REFLEX MICROSCOPIC  I-STAT CHEM 8, ED    On repeat exam the patient is awake and alert, no new complaints. We discussed all findings, including hyperglycemia, the absence of evidence for DKA or other collateral abnormalities. We had a lengthy conversation about follow-up, with consideration of insulin dosing changes.  MDM   Final diagnoses:  Hyperglycemia  Numbness    Patient presents with concern of episodic right foot numbness. Patient has diabetes, and today her evaluation is most notable for continued hyperglycemia, without evidence for decompensation. No evidence for stroke. Patient received additional insulin here, had no new complaints, no decompensation and was discharged to follow-up with primary care.    Carmin Muskrat, MD 05/19/14 2258

## 2014-05-19 NOTE — Discharge Instructions (Signed)
As discussed, it is important that she follow up with your primary care team to discuss adjustment of your insulin dosing regimen.  Return here for concerning changes

## 2014-05-19 NOTE — ED Notes (Addendum)
Pt states yesterday the bottom of her foot felt numb and heavy. Denies any pain. Foot is warm to touch +2 pedal pulse. sensation intact. Pt states she thinks her bs may be high she has not taken it in the last week.

## 2014-05-19 NOTE — ED Notes (Signed)
Checked patients blood sugar, CBG: 383

## 2014-05-20 ENCOUNTER — Telehealth: Payer: Self-pay | Admitting: Internal Medicine

## 2014-05-20 NOTE — Telephone Encounter (Signed)
Pt went to ed bc her foot /leg was numb and they found out her sugar was high. Pt had not been taking the insulin she was supposed to bc it was too expensive.(Insulin Detemir (LEVEMIR FLEXTOUCH) 100 UNIT/ML Pen) Pt would like to go back on the novolog. Pt states she cannot afford to come in for appt. Would like advise.. Pt last seen 09/2013. [facility]

## 2014-05-20 NOTE — Telephone Encounter (Signed)
Left message for pt to call back  °

## 2014-05-20 NOTE — Telephone Encounter (Signed)
Please see message and advise 

## 2014-05-20 NOTE — Telephone Encounter (Signed)
Please schedule ROV at no charge

## 2014-05-21 NOTE — Telephone Encounter (Signed)
Left message on voicemail to call office.  

## 2014-05-22 NOTE — Telephone Encounter (Signed)
Left message on voicemail to call office.  

## 2014-05-22 NOTE — Telephone Encounter (Signed)
Left detailed message on voicemail to call office to schedule appt next week to see Dr. Raliegh Ip no charge per him.

## 2014-05-27 NOTE — Telephone Encounter (Signed)
Left detailed message again for pt to call office and schedule appt this week per Dr.K.

## 2014-05-29 NOTE — Telephone Encounter (Signed)
Noted  

## 2014-05-29 NOTE — Telephone Encounter (Signed)
Pt scheduled for 2.5.2016

## 2014-05-30 ENCOUNTER — Ambulatory Visit: Payer: Commercial Managed Care - PPO | Admitting: Internal Medicine

## 2014-06-06 ENCOUNTER — Encounter: Payer: Self-pay | Admitting: Internal Medicine

## 2014-06-06 ENCOUNTER — Ambulatory Visit (INDEPENDENT_AMBULATORY_CARE_PROVIDER_SITE_OTHER): Payer: Commercial Managed Care - PPO | Admitting: Internal Medicine

## 2014-06-06 VITALS — BP 120/70 | HR 67 | Temp 97.5°F | Resp 20 | Ht 65.0 in | Wt 193.0 lb

## 2014-06-06 DIAGNOSIS — E785 Hyperlipidemia, unspecified: Secondary | ICD-10-CM

## 2014-06-06 DIAGNOSIS — R269 Unspecified abnormalities of gait and mobility: Secondary | ICD-10-CM

## 2014-06-06 DIAGNOSIS — I10 Essential (primary) hypertension: Secondary | ICD-10-CM

## 2014-06-06 DIAGNOSIS — E1049 Type 1 diabetes mellitus with other diabetic neurological complication: Secondary | ICD-10-CM

## 2014-06-06 DIAGNOSIS — IMO0002 Reserved for concepts with insufficient information to code with codable children: Secondary | ICD-10-CM

## 2014-06-06 DIAGNOSIS — E1041 Type 1 diabetes mellitus with diabetic mononeuropathy: Secondary | ICD-10-CM

## 2014-06-06 DIAGNOSIS — Z23 Encounter for immunization: Secondary | ICD-10-CM

## 2014-06-06 DIAGNOSIS — I679 Cerebrovascular disease, unspecified: Secondary | ICD-10-CM

## 2014-06-06 DIAGNOSIS — E1065 Type 1 diabetes mellitus with hyperglycemia: Secondary | ICD-10-CM

## 2014-06-06 MED ORDER — INSULIN DETEMIR 100 UNIT/ML FLEXPEN: 90.0000 [IU] | PEN_INJECTOR | SUBCUTANEOUS | Status: DC

## 2014-06-06 MED ORDER — INSULIN ASPART 100 UNIT/ML FLEXPEN: 30.0000 [IU] | PEN_INJECTOR | Freq: Three times a day (TID) | SUBCUTANEOUS | Status: DC

## 2014-06-06 NOTE — Progress Notes (Signed)
Pre visit review using our clinic review tool, if applicable. No additional management support is needed unless otherwise documented below in the visit note. 

## 2014-06-06 NOTE — Progress Notes (Signed)
Subjective:    Patient ID: [name], female    DOB: 01/28/1961, 54 y.o.   MRN: [id number]  HPI Lab Results  Component Value Date   HGBA1C 13.9* 11/22/2013    54 year old patient who has a history of cerebrovascular disease, hypertension and poorly controlled diabetes.  She has not been seen in a number of months due to cost considerations.  She was encouraged to come in today for a no charge office visit She states for the past 3 or 4 weeks.  Blood sugars have ranged from 350-400.  She has not been in to see endocrinology due to a 35 dollar co-pay.  He has suggested single insulin only with 90 units of Levemir daily.  Presently the patient is only taking 30 units due to cost considerations.  She is on NovoLog 30 units 3 times daily prior to each meal  Samples provided today as well as prescription savings cards.  She was given hand written prescriptions to shop around and attempt to get her medications at the best cost other than insulin.  All her other medications are generics  Past Medical History  Diagnosis Date  . Abnormality of gait 05/10/2010  . BACK PAIN 11/14/2008  . CEREBROVASCULAR DISEASE 08/05/2008  . DIABETES MELLITUS, TYPE II 07/15/2008  . Diplopia 07/15/2008  . ECZEMA, ATOPIC 04/03/2009  . HYPERLIPIDEMIA 03/06/2009  . HYPERTENSION 07/15/2008  . Vertebral artery stenosis   . Stroke     History   Social History  . Marital Status: Single    Spouse Name: N/A    Number of Children: N/A  . Years of Education: N/A   Occupational History  . Not on file.   Social History Main Topics  . Smoking status: Never Smoker   . Smokeless tobacco: Never Used  . Alcohol Use: No  . Drug Use: No  . Sexual Activity: Not on file   Other Topics Concern  . Not on file   Social History Narrative    Past Surgical History  Procedure Laterality Date  . Abdominal hysterectomy    . Foot surgery      Family History  Problem Relation Age of Onset  . Diabetes Sister     No  Known Allergies  Current Outpatient Prescriptions on File Prior to Visit  Medication Sig Dispense Refill  . amLODipine (NORVASC) 10 MG tablet TAKE ONE TABLET BY MOUTH ONCE DAILY 90 tablet 1  . chlorthalidone (HYGROTON) 25 MG tablet TAKE ONE TABLET BY MOUTH ONCE DAILY 90 tablet 0  . clopidogrel (PLAVIX) 75 MG tablet TAKE ONE TABLET BY MOUTH ONCE DAILY 90 tablet 1  . FLUoxetine (PROZAC) 20 MG capsule TAKE ONE CAPSULE BY MOUTH ONCE DAILY 90 capsule 1  . glucose blood test strip 1 each by Other route 2 (two) times daily.    . insulin aspart (NOVOLOG) 100 UNIT/ML injection Inject 30 Units into the skin 3 (three) times daily before meals.    . Insulin Detemir (LEVEMIR FLEXTOUCH) 100 UNIT/ML Pen Inject 90 Units into the skin every morning. And lancets 2/day (Patient taking differently: Inject 30 Units into the skin daily at 10 pm. And lancets 2/day) 30 mL 11  . Insulin Pen Needle (BD PEN NEEDLE NANO U/F) 32G X 4 MM MISC 1 each by Does not apply route 4 (four) times daily -  with meals and at bedtime. 100 each 6  . Lancets (ONETOUCH ULTRASOFT) lancets Use as instructed 100 each 6  . lisinopril (PRINIVIL,ZESTRIL) 40 MG tablet  TAKE ONE TABLET BY MOUTH EVERY DAY 90 tablet 1  . pantoprazole (PROTONIX) 40 MG tablet TAKE ONE TABLET BY MOUTH ONCE DAILY 90 tablet 3  . pravastatin (PRAVACHOL) 40 MG tablet TAKE ONE TABLET BY MOUTH EVERY DAY 90 tablet 1  . traMADol (ULTRAM) 50 MG tablet Take 1 tablet (50 mg total) by mouth every 8 (eight) hours as needed for pain. 30 tablet 0  . triamcinolone ointment (KENALOG) 0.5 % APPLY  TWICE DAILY 30 g 5   No current facility-administered medications on file prior to visit.    BP 120/70 mmHg  Pulse 67  Temp(Src) 97.5 F (36.4 C) (Oral)  Resp 20  Ht 5\' 5"  (1.651 m)  Wt 193 lb (87.544 kg)  BMI 32.12 kg/m2  SpO2 98%      Review of Systems  Constitutional: Negative.   HENT: Negative for congestion, dental problem, hearing loss, rhinorrhea, sinus pressure, sore  throat and tinnitus.   Eyes: Negative for pain, discharge and visual disturbance.  Respiratory: Negative for cough and shortness of breath.   Cardiovascular: Negative for chest pain, palpitations and leg swelling.  Gastrointestinal: Negative for nausea, vomiting, abdominal pain, diarrhea, constipation, blood in stool and abdominal distention.  Genitourinary: Negative for dysuria, urgency, frequency, hematuria, flank pain, vaginal bleeding, vaginal discharge, difficulty urinating, vaginal pain and pelvic pain.  Musculoskeletal: Negative for joint swelling, arthralgias and gait problem.  Skin: Negative for rash.  Neurological: Negative for dizziness, syncope, speech difficulty, weakness, numbness and headaches.  Hematological: Negative for adenopathy.  Psychiatric/Behavioral: Negative for behavioral problems, dysphoric mood and agitation. The patient is not nervous/anxious.        Objective:   Physical Exam  Constitutional: She is oriented to person, place, and time. She appears well-developed and well-nourished.  Blood pressure 120/70  HENT:  Head: Normocephalic and atraumatic.  Right Ear: External ear normal.  Left Ear: External ear normal.  Mouth/Throat: Oropharynx is clear and moist.  Eyes: Conjunctivae and EOM are normal.  Neck: Normal range of motion. Neck supple. No JVD present. No thyromegaly present.  Cardiovascular: Normal rate, regular rhythm, normal heart sounds and intact distal pulses.   No murmur heard. Pulmonary/Chest: Effort normal and breath sounds normal. She has no wheezes. She has no rales.  Abdominal: Soft. Bowel sounds are normal. She exhibits no distension and no mass. There is no tenderness. There is no rebound and no guarding.  Genitourinary: Vagina normal.  Musculoskeletal: Normal range of motion. She exhibits no edema or tenderness.  Neurological: She is alert and oriented to person, place, and time. She has normal reflexes. No cranial nerve deficit. She  exhibits normal muscle tone. Coordination normal.  Skin: Skin is warm and dry. No rash noted.  Psychiatric: She has a normal mood and affect. Her behavior is normal.          Assessment & Plan:   Diabetes mellitus.  Poor control.  Noncompliance due largely to cost considerations.  Samples provided.  Will encourage follow-up with endocrinology Hypertension, well-controlled Cerebral vascular disease  Eye examination encouraged.    Information concerning drug assistance program.  Also dispensed

## 2014-06-06 NOTE — Patient Instructions (Signed)
Follow-up with endocrinology  Pursue patient assistance programs to get your medications at a lower cost  Shop around for best drug crisis  Please see your eye doctor yearly to check for diabetic eye damage

## 2014-06-27 ENCOUNTER — Ambulatory Visit (INDEPENDENT_AMBULATORY_CARE_PROVIDER_SITE_OTHER): Payer: Commercial Managed Care - PPO | Admitting: Endocrinology

## 2014-06-27 ENCOUNTER — Telehealth: Payer: Self-pay | Admitting: Internal Medicine

## 2014-06-27 ENCOUNTER — Encounter: Payer: Self-pay | Admitting: Endocrinology

## 2014-06-27 VITALS — BP 126/80 | HR 63 | Temp 98.0°F | Ht 65.0 in | Wt 194.0 lb

## 2014-06-27 DIAGNOSIS — IMO0002 Reserved for concepts with insufficient information to code with codable children: Secondary | ICD-10-CM

## 2014-06-27 DIAGNOSIS — E1065 Type 1 diabetes mellitus with hyperglycemia: Principal | ICD-10-CM

## 2014-06-27 DIAGNOSIS — E1041 Type 1 diabetes mellitus with diabetic mononeuropathy: Secondary | ICD-10-CM

## 2014-06-27 DIAGNOSIS — E1049 Type 1 diabetes mellitus with other diabetic neurological complication: Secondary | ICD-10-CM

## 2014-06-27 LAB — HEMOGLOBIN A1C: Hgb A1c MFr Bld: 14.4 % — ABNORMAL HIGH (ref 4.6–6.5)

## 2014-06-27 MED ORDER — INSULIN DETEMIR 100 UNIT/ML FLEXPEN: 150.0000 [IU] | PEN_INJECTOR | SUBCUTANEOUS | Status: DC

## 2014-06-27 NOTE — Telephone Encounter (Signed)
Left detailed message that Dr. Raliegh Ip is gone for the day and I need to know which is covered by your insurance. Please call your insurance company and call me back on Monday so I can order new insulin.

## 2014-06-27 NOTE — Progress Notes (Signed)
Subjective:    Patient ID: [name], female    DOB: November 17, 1960, 54 y.o.   MRN: [id number]  HPI  Pt returns for f/u of diabetes mellitus: DM type: Insulin-requiring type 2 Dx'ed: 3570 Complications: cerebrovascular disease Therapy: insulin since soon after dx GDM: never DKA: never Severe hypoglycemia: never Pancreatitis: never Other: She requests to take just 1 injection per day, as she is having difficulty remembering the multiple daily injections Interval history: Pt says her copay has increased to the point where she can no longer afford it.  no cbg record, but states cbg's vary widely.  She takes multiple daily injections.  Pt says she misses approx 9 doses per week.   Past Medical History  Diagnosis Date  . Abnormality of gait 05/10/2010  . BACK PAIN 11/14/2008  . CEREBROVASCULAR DISEASE 08/05/2008  . DIABETES MELLITUS, TYPE II 07/15/2008  . Diplopia 07/15/2008  . ECZEMA, ATOPIC 04/03/2009  . HYPERLIPIDEMIA 03/06/2009  . HYPERTENSION 07/15/2008  . Vertebral artery stenosis   . Stroke     Past Surgical History  Procedure Laterality Date  . Abdominal hysterectomy    . Foot surgery      History   Social History  . Marital Status: Single    Spouse Name: N/A  . Number of Children: N/A  . Years of Education: N/A   Occupational History  . Not on file.   Social History Main Topics  . Smoking status: Never Smoker   . Smokeless tobacco: Never Used  . Alcohol Use: No  . Drug Use: No  . Sexual Activity: Not on file   Other Topics Concern  . Not on file   Social History Narrative    Current Outpatient Prescriptions on File Prior to Visit  Medication Sig Dispense Refill  . amLODipine (NORVASC) 10 MG tablet TAKE ONE TABLET BY MOUTH ONCE DAILY 90 tablet 1  . chlorthalidone (HYGROTON) 25 MG tablet TAKE ONE TABLET BY MOUTH ONCE DAILY 90 tablet 0  . clopidogrel (PLAVIX) 75 MG tablet TAKE ONE TABLET BY MOUTH ONCE DAILY 90 tablet 1  . FLUoxetine (PROZAC) 20 MG capsule  TAKE ONE CAPSULE BY MOUTH ONCE DAILY 90 capsule 1  . glucose blood test strip 1 each by Other route 2 (two) times daily.    . Insulin Pen Needle (BD PEN NEEDLE NANO U/F) 32G X 4 MM MISC 1 each by Does not apply route 4 (four) times daily -  with meals and at bedtime. 100 each 6  . Lancets (ONETOUCH ULTRASOFT) lancets Use as instructed 100 each 6  . lisinopril (PRINIVIL,ZESTRIL) 40 MG tablet TAKE ONE TABLET BY MOUTH EVERY DAY 90 tablet 1  . pantoprazole (PROTONIX) 40 MG tablet TAKE ONE TABLET BY MOUTH ONCE DAILY 90 tablet 3  . pravastatin (PRAVACHOL) 40 MG tablet TAKE ONE TABLET BY MOUTH EVERY DAY 90 tablet 1  . traMADol (ULTRAM) 50 MG tablet Take 1 tablet (50 mg total) by mouth every 8 (eight) hours as needed for pain. 30 tablet 0  . triamcinolone ointment (KENALOG) 0.5 % APPLY  TWICE DAILY 30 g 5   No current facility-administered medications on file prior to visit.    No Known Allergies  Family History  Problem Relation Age of Onset  . Diabetes Sister     BP 126/80 mmHg  Pulse 63  Temp(Src) 98 F (36.7 C) (Oral)  Ht 5\' 5"  (1.651 m)  Wt 194 lb (87.998 kg)  BMI 32.28 kg/m2  SpO2 97%   Review  of Systems She denies hypoglycemia and weight change.      Objective:   Physical Exam VITAL SIGNS:  See vs page GENERAL: no distress Pulses: dorsalis pedis intact bilat.   MSK: no deformity of the feet.   CV: no leg edema Skin:  no ulcer on the feet.  normal color and temp on the feet. Neuro: sensation is intact to touch on the feet   Lab Results  Component Value Date   HGBA1C 14.4* 06/27/2014       Assessment & Plan:  DM: worse Noncompliance with cbg recording and insulin dosing, worse: I'll work around this as best I can, which is the simplest possible insulin regimen, and reasonable goals for glycemic control.   Patient is advised the following: Patient Instructions  check your blood sugar twice a day.  vary the time of day when you check, between before the 3 meals,  and at bedtime.  also check if you have symptoms of your blood sugar being too high or too low.  please keep a record of the readings and bring it to your next appointment here.  You can write it on any piece of paper.  please call us sooner if your blood sugar goes below 70, or if you have a lot of readings over 200. A diabetes blood test is requested for you today.  We'll contact you with results.   For now, please stop taking the novolog, and take just levemir, 150 units each morning.  i have sent a prescription to your pharmacy Please come back for a follow-up appointment in 2 weeks.   i have checked with [facility].  Both of your insulins are tier 2, which is the lowest except for generic.

## 2014-06-27 NOTE — Telephone Encounter (Signed)
Pt needs new rxs  lantus and toujeo. Pt can not afford to get levemir. [facility]

## 2014-06-27 NOTE — Patient Instructions (Addendum)
check your blood sugar twice a day.  vary the time of day when you check, between before the 3 meals, and at bedtime.  also check if you have symptoms of your blood sugar being too high or too low.  please keep a record of the readings and bring it to your next appointment here.  You can write it on any piece of paper.  please call us sooner if your blood sugar goes below 70, or if you have a lot of readings over 200. A diabetes blood test is requested for you today.  We'll contact you with results.   For now, please stop taking the novolog, and take just levemir, 150 units each morning.  i have sent a prescription to your pharmacy Please come back for a follow-up appointment in 2 weeks.   i have checked with [employer].  Both of your insulins are tier 2, which is the lowest except for generic.

## 2014-06-30 NOTE — Telephone Encounter (Signed)
Left message on voicemail to call office.  

## 2014-07-01 NOTE — Telephone Encounter (Signed)
Left message on voicemail to call office.  

## 2014-07-04 ENCOUNTER — Telehealth: Payer: Self-pay | Admitting: Internal Medicine

## 2014-07-04 MED ORDER — INSULIN GLARGINE 100 UNIT/ML SOLOSTAR PEN: 150.0000 [IU] | PEN_INJECTOR | SUBCUTANEOUS | Status: DC

## 2014-07-04 NOTE — Telephone Encounter (Signed)
Discussed with Dr.K, order given for Lantus  150 units every morning to replace Levemir. Rx sent to pharmacy and pt notified.

## 2014-07-04 NOTE — Telephone Encounter (Signed)
Pt can not afford levemir. Pt ins will cover  lantus and humulin call into [facility]

## 2014-07-11 ENCOUNTER — Ambulatory Visit: Payer: Commercial Managed Care - PPO | Admitting: Endocrinology

## 2014-07-11 ENCOUNTER — Telehealth: Payer: Self-pay | Admitting: Endocrinology

## 2014-07-11 NOTE — Telephone Encounter (Signed)
Patient no showed today's appt. Please advise on how to follow up. °A. No follow up necessary. °B. Follow up urgent. Contact patient immediately. °C. Follow up necessary. Contact patient and schedule visit in ___ days. °D. Follow up advised. Contact patient and schedule visit in ____weeks. ° °

## 2014-07-11 NOTE — Telephone Encounter (Signed)
,  lbendo

## 2014-07-11 NOTE — Telephone Encounter (Signed)
Please read message below and advise front desk.

## 2014-07-12 NOTE — Telephone Encounter (Signed)
Follow up advised. Contact patient and schedule visit in 6 weeks. 

## 2014-07-14 NOTE — Telephone Encounter (Signed)
Lvom requesting pt to call back and reschedule appointment.

## 2014-07-23 ENCOUNTER — Other Ambulatory Visit: Payer: Self-pay | Admitting: Internal Medicine

## 2014-07-24 ENCOUNTER — Telehealth: Payer: Self-pay | Admitting: Internal Medicine

## 2014-07-24 MED ORDER — AMLODIPINE BESYLATE 10 MG PO TABS: 10.0000 mg | ORAL_TABLET | Freq: Every day | ORAL | Status: DC

## 2014-07-24 NOTE — Telephone Encounter (Signed)
Spoke to pt, told her we do not have any samples of Lantus or Levemir. Rx for Amlodipine sent to [facility]. Pt verbalized understanding.

## 2014-07-24 NOTE — Telephone Encounter (Signed)
Pt would like samples of levemir or lantus. Pt also needs refill on amlodipine call into [facility]

## 2014-09-04 ENCOUNTER — Other Ambulatory Visit: Payer: Self-pay | Admitting: Internal Medicine

## 2014-09-12 ENCOUNTER — Other Ambulatory Visit: Payer: Self-pay | Admitting: Internal Medicine

## 2014-09-22 ENCOUNTER — Telehealth: Payer: Self-pay | Admitting: Family Medicine

## 2014-09-22 DIAGNOSIS — Z1239 Encounter for other screening for malignant neoplasm of breast: Secondary | ICD-10-CM

## 2014-09-22 NOTE — Telephone Encounter (Signed)
Left a message for the pt to return my call.  Need to see if she has had a recent mammogram. 

## 2014-09-22 NOTE — Telephone Encounter (Signed)
Patient states she hasn't had a mammogram.

## 2014-09-23 NOTE — Telephone Encounter (Signed)
Mammogram ordered in the system.

## 2014-09-23 NOTE — Addendum Note (Signed)
Addended by: Miles Costain T on: 09/23/2014 08:38 AM   Modules accepted: Orders

## 2014-11-07 ENCOUNTER — Other Ambulatory Visit: Payer: Self-pay | Admitting: Internal Medicine

## 2015-01-30 LAB — HM DIABETES EYE EXAM

## 2015-01-31 ENCOUNTER — Encounter: Payer: Self-pay | Admitting: Internal Medicine

## 2015-02-07 ENCOUNTER — Other Ambulatory Visit: Payer: Self-pay | Admitting: Internal Medicine

## 2015-02-21 ENCOUNTER — Other Ambulatory Visit: Payer: Self-pay | Admitting: Internal Medicine

## 2015-04-11 ENCOUNTER — Other Ambulatory Visit: Payer: Self-pay | Admitting: Internal Medicine

## 2015-05-15 ENCOUNTER — Other Ambulatory Visit: Payer: Self-pay | Admitting: Internal Medicine

## 2015-06-12 ENCOUNTER — Ambulatory Visit
Admission: RE | Admit: 2015-06-12 | Discharge: 2015-06-12 | Disposition: A | Discharge status: Home / Self Care | Payer: Commercial Managed Care - PPO | Source: Ambulatory Visit | Attending: Internal Medicine | Admitting: Internal Medicine

## 2015-06-12 DIAGNOSIS — Z1239 Encounter for other screening for malignant neoplasm of breast: Secondary | ICD-10-CM

## 2015-07-29 ENCOUNTER — Ambulatory Visit (INDEPENDENT_AMBULATORY_CARE_PROVIDER_SITE_OTHER): Payer: Commercial Managed Care - PPO | Admitting: Internal Medicine

## 2015-07-29 ENCOUNTER — Other Ambulatory Visit: Payer: Commercial Managed Care - PPO

## 2015-07-29 ENCOUNTER — Encounter: Payer: Self-pay | Admitting: Internal Medicine

## 2015-07-29 ENCOUNTER — Other Ambulatory Visit: Payer: Self-pay | Admitting: *Deleted

## 2015-07-29 VITALS — BP 120/70 | HR 64 | Temp 98.5°F | Resp 18 | Ht 65.0 in | Wt 185.0 lb

## 2015-07-29 DIAGNOSIS — I679 Cerebrovascular disease, unspecified: Secondary | ICD-10-CM | POA: Diagnosis not present

## 2015-07-29 DIAGNOSIS — E785 Hyperlipidemia, unspecified: Secondary | ICD-10-CM | POA: Diagnosis not present

## 2015-07-29 DIAGNOSIS — E1151 Type 2 diabetes mellitus with diabetic peripheral angiopathy without gangrene: Secondary | ICD-10-CM

## 2015-07-29 DIAGNOSIS — R5383 Other fatigue: Secondary | ICD-10-CM | POA: Diagnosis not present

## 2015-07-29 DIAGNOSIS — I10 Essential (primary) hypertension: Secondary | ICD-10-CM

## 2015-07-29 DIAGNOSIS — E1040 Type 1 diabetes mellitus with diabetic neuropathy, unspecified: Secondary | ICD-10-CM

## 2015-07-29 DIAGNOSIS — E1049 Type 1 diabetes mellitus with other diabetic neurological complication: Secondary | ICD-10-CM | POA: Diagnosis not present

## 2015-07-29 DIAGNOSIS — E119 Type 2 diabetes mellitus without complications: Secondary | ICD-10-CM | POA: Insufficient documentation

## 2015-07-29 LAB — CBC WITH DIFFERENTIAL/PLATELET
Basophils Absolute: 0 10*3/uL (ref 0.0–0.1)
Basophils Relative: 0.4 % (ref 0.0–3.0)
EOS ABS: 0.1 10*3/uL (ref 0.0–0.7)
Eosinophils Relative: 1.7 % (ref 0.0–5.0)
HCT: 40.9 % (ref 36.0–46.0)
Hemoglobin: 13.7 g/dL (ref 12.0–15.0)
LYMPHS ABS: 3.2 10*3/uL (ref 0.7–4.0)
Lymphocytes Relative: 50.7 % — ABNORMAL HIGH (ref 12.0–46.0)
MCHC: 33.6 g/dL (ref 30.0–36.0)
MCV: 76.3 fl — AB (ref 78.0–100.0)
MONOS PCT: 10.8 % (ref 3.0–12.0)
Monocytes Absolute: 0.7 10*3/uL (ref 0.1–1.0)
NEUTROS PCT: 36.4 % — AB (ref 43.0–77.0)
Neutro Abs: 2.3 10*3/uL (ref 1.4–7.7)
PLATELETS: 250 10*3/uL (ref 150.0–400.0)
RBC: 5.36 Mil/uL — AB (ref 3.87–5.11)
RDW: 14.8 % (ref 11.5–15.5)
WBC: 6.4 10*3/uL (ref 4.0–10.5)

## 2015-07-29 LAB — LIPID PANEL
CHOL/HDL RATIO: 7
Cholesterol: 274 mg/dL — ABNORMAL HIGH (ref 0–200)
HDL: 40.5 mg/dL (ref >39.00)
LDL Cholesterol: 204 mg/dL — ABNORMAL HIGH (ref 0–99)
NonHDL: 233.5
Triglycerides: 149 mg/dL (ref 0.0–149.0)
VLDL: 29.8 mg/dL (ref 0.0–40.0)

## 2015-07-29 LAB — COMPREHENSIVE METABOLIC PANEL
ALT: 14 U/L (ref 0–35)
AST: 13 U/L (ref 0–37)
Albumin: 3.7 g/dL (ref 3.5–5.2)
Alkaline Phosphatase: 83 U/L (ref 39–117)
BILIRUBIN TOTAL: 0.5 mg/dL (ref 0.2–1.2)
BUN: 10 mg/dL (ref 6–23)
CO2: 30 meq/L (ref 19–32)
CREATININE: 0.7 mg/dL (ref 0.40–1.20)
Calcium: 9.8 mg/dL (ref 8.4–10.5)
Chloride: 95 mEq/L — ABNORMAL LOW (ref 96–112)
GFR: 111.24 mL/min (ref >60.00)
Glucose, Bld: 245 mg/dL — ABNORMAL HIGH (ref 70–99)
Potassium: 3.9 mEq/L (ref 3.5–5.1)
Sodium: 132 mEq/L — ABNORMAL LOW (ref 135–145)
Total Protein: 7.3 g/dL (ref 6.0–8.3)

## 2015-07-29 LAB — POCT GLUCOSE (DEVICE FOR HOME USE): POC GLUCOSE: 226 mg/dL — AB (ref 70–99)

## 2015-07-29 LAB — MICROALBUMIN / CREATININE URINE RATIO
Creatinine,U: 76.1 mg/dL
Microalb Creat Ratio: 1.7 mg/g (ref 0.0–30.0)
Microalb, Ur: 1.3 mg/dL (ref 0.0–1.9)

## 2015-07-29 LAB — TSH: TSH: 0.62 u[IU]/mL (ref 0.35–4.50)

## 2015-07-29 MED ORDER — GLUCOSE BLOOD VI STRP: ORAL_STRIP | Status: DC

## 2015-07-29 MED ORDER — INSULIN GLARGINE 100 UNIT/ML SOLOSTAR PEN: 80.0000 [IU] | PEN_INJECTOR | SUBCUTANEOUS | Status: DC

## 2015-07-29 MED ORDER — ONETOUCH LANCETS MISC: Status: DC

## 2015-07-29 MED ORDER — INSULIN PEN NEEDLE 32G X 4 MM MISC: Status: DC

## 2015-07-29 NOTE — Patient Instructions (Signed)
Limit your sodium (Salt) intake   Please check your hemoglobin A1c every 3 months  Please check your blood pressure on a regular basis.  If it is consistently greater than 150/90, please make an office appointment.  Increase Lantus to 80 units daily  Monitor blood sugars twice daily.  Call office of blood sugars are less than 70 or consistently greater than 200  Follow-up with endocrinology (Dr. Loanne Drilling)

## 2015-07-29 NOTE — Progress Notes (Signed)
Subjective:    Patient ID: [name], female    DOB: Mar 04, 1961, 55 y.o.   MRN: [id number]  HPI Lab Results  Component Value Date   HGBA1C 14.4* 06/27/2014   55 year old patient who has not been seen in over one year.  She was seen by endocrinology approximately 13 months ago and was switched to a regimen of once daily, Lantus insulin 150 units.  She was unable to comply with multiple daily injections.  At the present time she is taking Lantus 60 units daily.  Blood sugars are generally greater than 225.  A random blood sugar here today to 26.  She states that she has had a recent eye exam, probably in January of this year She only checks blood sugars once weekly She does not take her Lantus consistently due to cost concerns.  She states that Lantus is costing 3-400 dollars per month.  Apparently this is a tier 2 drug Her diabetic eye examination was normal according to the patient She does have cerebrovascular disease, essential hypertension and dyslipidemia.  Past Medical History  Diagnosis Date  . Abnormality of gait 05/10/2010  . BACK PAIN 11/14/2008  . CEREBROVASCULAR DISEASE 08/05/2008  . DIABETES MELLITUS, TYPE II 07/15/2008  . Diplopia 07/15/2008  . ECZEMA, ATOPIC 04/03/2009  . HYPERLIPIDEMIA 03/06/2009  . HYPERTENSION 07/15/2008  . Vertebral artery stenosis   . Stroke [facility])     Social History   Social History  . Marital Status: Single    Spouse Name: N/A  . Number of Children: N/A  . Years of Education: N/A   Occupational History  . Not on file.   Social History Main Topics  . Smoking status: Never Smoker   . Smokeless tobacco: Never Used  . Alcohol Use: No  . Drug Use: No  . Sexual Activity: Not on file   Other Topics Concern  . Not on file   Social History Narrative    Past Surgical History  Procedure Laterality Date  . Abdominal hysterectomy    . Foot surgery      Family History  Problem Relation Age of Onset  . Diabetes Sister     No Known  Allergies  Current Outpatient Prescriptions on File Prior to Visit  Medication Sig Dispense Refill  . amLODipine (NORVASC) 10 MG tablet TAKE ONE TABLET BY MOUTH ONCE DAILY 90 tablet 0  . chlorthalidone (HYGROTON) 25 MG tablet TAKE ONE TABLET BY MOUTH ONCE DAILY **NEED  FOLLOW  UP  APPOINTMENT** 90 tablet 0  . clopidogrel (PLAVIX) 75 MG tablet TAKE ONE TABLET BY MOUTH ONCE DAILY 90 tablet 0  . FLUoxetine (PROZAC) 20 MG capsule TAKE ONE CAPSULE BY MOUTH ONCE DAILY 90 capsule 0  . glucose blood test strip 1 each by Other route 2 (two) times daily.    . Insulin Detemir (LEVEMIR FLEXTOUCH) 100 UNIT/ML Pen Inject 150 Units into the skin every morning. 20 pen 11  . Insulin Glargine (LANTUS SOLOSTAR) 100 UNIT/ML Solostar Pen Inject 150 Units into the skin every morning. 50 pen 0  . Insulin Pen Needle (BD PEN NEEDLE NANO U/F) 32G X 4 MM MISC 1 each by Does not apply route 4 (four) times daily -  with meals and at bedtime. 100 each 6  . Lancets (ONETOUCH ULTRASOFT) lancets Use as instructed 100 each 6  . lisinopril (PRINIVIL,ZESTRIL) 40 MG tablet TAKE ONE TABLET BY MOUTH ONCE DAILY 90 tablet 0  . pantoprazole (PROTONIX) 40 MG tablet TAKE ONE TABLET BY  MOUTH ONCE DAILY 90 tablet 1  . pravastatin (PRAVACHOL) 40 MG tablet TAKE ONE TABLET BY MOUTH ONCE DAILY 90 tablet 0  . traMADol (ULTRAM) 50 MG tablet Take 1 tablet (50 mg total) by mouth every 8 (eight) hours as needed for pain. 30 tablet 0  . triamcinolone ointment (KENALOG) 0.5 % APPLY  TWICE DAILY 30 g 5   No current facility-administered medications on file prior to visit.    BP 120/70 mmHg  Pulse 64  Temp(Src) 98.5 F (36.9 C) (Oral)  Resp 18  Ht 5\' 5"  (1.651 m)  Wt 185 lb (83.915 kg)  BMI 30.79 kg/m2  SpO2 99%     Review of Systems  Constitutional: Negative.   HENT: Negative for congestion, dental problem, hearing loss, rhinorrhea, sinus pressure, sore throat and tinnitus.   Eyes: Negative for pain, discharge and visual disturbance.    Respiratory: Negative for cough and shortness of breath.   Cardiovascular: Negative for chest pain, palpitations and leg swelling.  Gastrointestinal: Positive for abdominal pain. Negative for nausea, vomiting, diarrhea, constipation, blood in stool and abdominal distention.  Genitourinary: Positive for frequency. Negative for dysuria, urgency, hematuria, flank pain, vaginal bleeding, vaginal discharge, difficulty urinating, vaginal pain and pelvic pain.  Musculoskeletal: Negative for joint swelling, arthralgias and gait problem.  Skin: Negative for rash.  Neurological: Negative for dizziness, syncope, speech difficulty, weakness, numbness and headaches.  Hematological: Negative for adenopathy.  Psychiatric/Behavioral: Negative for behavioral problems, dysphoric mood and agitation. The patient is not nervous/anxious.        Objective:   Physical Exam  Constitutional: She is oriented to person, place, and time. She appears well-developed and well-nourished.  HENT:  Head: Normocephalic.  Right Ear: External ear normal.  Left Ear: External ear normal.  Mouth/Throat: Oropharynx is clear and moist.  Eyes: Conjunctivae and EOM are normal. Pupils are equal, round, and reactive to light.  Neck: Normal range of motion. Neck supple. No thyromegaly present.  Cardiovascular: Normal rate, regular rhythm, normal heart sounds and intact distal pulses.   Pulmonary/Chest: Effort normal and breath sounds normal.  Abdominal: Soft. Bowel sounds are normal. She exhibits no mass. There is no tenderness.  Musculoskeletal: Normal range of motion.  Lymphadenopathy:    She has no cervical adenopathy.  Neurological: She is alert and oriented to person, place, and time.  Skin: Skin is warm and dry. No rash noted.  Psychiatric: She has a normal mood and affect. Her behavior is normal.          Assessment & Plan:   Diabetes, poor control.  We'll uptitrate insulin to 80 units daily.  Poor compliance due to  cost issues.  Multiple Lantus pens dispensed today Hypertension, stable Dyslipidemia.  Continue statin therapy Cerebrovascular disease, stable  Check updated lab including hemoglobin A1c, lipid profile and urine for microalbumin Encouraged to follow-up with endocrinology  Recheck here 3 months

## 2015-07-29 NOTE — Progress Notes (Signed)
Pre visit review using our clinic review tool, if applicable. No additional management support is needed unless otherwise documented below in the visit note. 

## 2015-07-30 ENCOUNTER — Other Ambulatory Visit: Payer: Commercial Managed Care - PPO

## 2015-07-30 DIAGNOSIS — R7309 Other abnormal glucose: Secondary | ICD-10-CM

## 2015-07-31 LAB — HEMOGLOBIN A1C
HEMOGLOBIN A1C: 16.1 % — AB (ref <5.7)
HEMOGLOBIN A1C: 16.3 % — AB (ref <5.7)
MEAN PLASMA GLUCOSE: 421 mg/dL
Mean Plasma Glucose: 415 mg/dL

## 2015-08-03 ENCOUNTER — Telehealth: Payer: Self-pay | Admitting: Internal Medicine

## 2015-08-03 NOTE — Telephone Encounter (Signed)
Pt returning your call. Please call back °

## 2015-08-03 NOTE — Telephone Encounter (Signed)
See result note.  

## 2015-08-26 ENCOUNTER — Emergency Department (HOSPITAL_COMMUNITY)
Admission: EM | Admit: 2015-08-26 | Discharge: 2015-08-26 | Disposition: A | Discharge status: Home / Self Care | Payer: Commercial Managed Care - PPO | Attending: Emergency Medicine | Admitting: Emergency Medicine

## 2015-08-26 ENCOUNTER — Encounter (HOSPITAL_COMMUNITY): Payer: Self-pay | Admitting: *Deleted

## 2015-08-26 ENCOUNTER — Emergency Department (HOSPITAL_COMMUNITY): Payer: Commercial Managed Care - PPO

## 2015-08-26 DIAGNOSIS — E119 Type 2 diabetes mellitus without complications: Secondary | ICD-10-CM | POA: Diagnosis not present

## 2015-08-26 DIAGNOSIS — N200 Calculus of kidney: Secondary | ICD-10-CM | POA: Insufficient documentation

## 2015-08-26 DIAGNOSIS — Z794 Long term (current) use of insulin: Secondary | ICD-10-CM | POA: Diagnosis not present

## 2015-08-26 DIAGNOSIS — R109 Unspecified abdominal pain: Secondary | ICD-10-CM | POA: Diagnosis present

## 2015-08-26 DIAGNOSIS — E785 Hyperlipidemia, unspecified: Secondary | ICD-10-CM | POA: Diagnosis not present

## 2015-08-26 DIAGNOSIS — B029 Zoster without complications: Secondary | ICD-10-CM | POA: Diagnosis not present

## 2015-08-26 DIAGNOSIS — N12 Tubulo-interstitial nephritis, not specified as acute or chronic: Secondary | ICD-10-CM | POA: Diagnosis not present

## 2015-08-26 DIAGNOSIS — Z8673 Personal history of transient ischemic attack (TIA), and cerebral infarction without residual deficits: Secondary | ICD-10-CM | POA: Insufficient documentation

## 2015-08-26 DIAGNOSIS — Z90710 Acquired absence of both cervix and uterus: Secondary | ICD-10-CM | POA: Diagnosis not present

## 2015-08-26 DIAGNOSIS — Z79899 Other long term (current) drug therapy: Secondary | ICD-10-CM | POA: Insufficient documentation

## 2015-08-26 DIAGNOSIS — Z7902 Long term (current) use of antithrombotics/antiplatelets: Secondary | ICD-10-CM | POA: Diagnosis not present

## 2015-08-26 DIAGNOSIS — Z872 Personal history of diseases of the skin and subcutaneous tissue: Secondary | ICD-10-CM | POA: Insufficient documentation

## 2015-08-26 DIAGNOSIS — I10 Essential (primary) hypertension: Secondary | ICD-10-CM | POA: Insufficient documentation

## 2015-08-26 DIAGNOSIS — Z8669 Personal history of other diseases of the nervous system and sense organs: Secondary | ICD-10-CM | POA: Insufficient documentation

## 2015-08-26 LAB — CBC WITH DIFFERENTIAL/PLATELET
Basophils Absolute: 0 10*3/uL (ref 0.0–0.1)
Basophils Relative: 0 %
EOS PCT: 2 %
Eosinophils Absolute: 0.1 10*3/uL (ref 0.0–0.7)
HCT: 37.3 % (ref 36.0–46.0)
Hemoglobin: 12.7 g/dL (ref 12.0–15.0)
LYMPHS ABS: 3 10*3/uL (ref 0.7–4.0)
LYMPHS PCT: 44 %
MCH: 25.6 pg — AB (ref 26.0–34.0)
MCHC: 34 g/dL (ref 30.0–36.0)
MCV: 75.1 fL — AB (ref 78.0–100.0)
MONOS PCT: 9 %
Monocytes Absolute: 0.6 10*3/uL (ref 0.1–1.0)
Neutro Abs: 3 10*3/uL (ref 1.7–7.7)
Neutrophils Relative %: 45 %
PLATELETS: 257 10*3/uL (ref 150–400)
RBC: 4.97 MIL/uL (ref 3.87–5.11)
RDW: 14.3 % (ref 11.5–15.5)
WBC: 6.7 10*3/uL (ref 4.0–10.5)

## 2015-08-26 LAB — URINE MICROSCOPIC-ADD ON

## 2015-08-26 LAB — COMPREHENSIVE METABOLIC PANEL
ALK PHOS: 76 U/L (ref 38–126)
ALT: 14 U/L (ref 14–54)
ANION GAP: 9 (ref 5–15)
AST: 16 U/L (ref 15–41)
Albumin: 3.3 g/dL — ABNORMAL LOW (ref 3.5–5.0)
BUN: 17 mg/dL (ref 6–20)
CALCIUM: 9.5 mg/dL (ref 8.9–10.3)
CO2: 29 mmol/L (ref 22–32)
Chloride: 100 mmol/L — ABNORMAL LOW (ref 101–111)
Creatinine, Ser: 0.69 mg/dL (ref 0.44–1.00)
GLUCOSE: 381 mg/dL — AB (ref 65–99)
Potassium: 3.8 mmol/L (ref 3.5–5.1)
Sodium: 138 mmol/L (ref 135–145)
TOTAL PROTEIN: 7.2 g/dL (ref 6.5–8.1)
Total Bilirubin: 0.5 mg/dL (ref 0.3–1.2)

## 2015-08-26 LAB — URINALYSIS, ROUTINE W REFLEX MICROSCOPIC
BILIRUBIN URINE: NEGATIVE
Glucose, UA: >1000 mg/dL — AB
HGB URINE DIPSTICK: NEGATIVE
KETONES UR: NEGATIVE mg/dL
Leukocytes, UA: NEGATIVE
NITRITE: NEGATIVE
PROTEIN: NEGATIVE mg/dL
SPECIFIC GRAVITY, URINE: 1.02 (ref 1.005–1.030)
pH: 7 (ref 5.0–8.0)

## 2015-08-26 LAB — LIPASE, BLOOD: LIPASE: 41 U/L (ref 11–51)

## 2015-08-26 NOTE — ED Provider Notes (Signed)
CSN: [id number]     Arrival date & time 08/26/15  0654 History   First MD Initiated Contact with Patient 08/26/15 0715     Chief Complaint  Patient presents with  . Flank Pain     (Consider location/radiation/quality/duration/timing/severity/associated sxs/prior Treatment) Patient is a 55 y.o. female presenting with flank pain.  Flank Pain This is a new problem. The current episode started more than 1 week ago. The problem occurs daily. Pertinent negatives include no chest pain, no headaches and no shortness of breath. Nothing aggravates the symptoms. The symptoms are relieved by relaxation. She has tried nothing for the symptoms.    Past Medical History  Diagnosis Date  . Abnormality of gait 05/10/2010  . BACK PAIN 11/14/2008  . CEREBROVASCULAR DISEASE 08/05/2008  . DIABETES MELLITUS, TYPE II 07/15/2008  . Diplopia 07/15/2008  . ECZEMA, ATOPIC 04/03/2009  . HYPERLIPIDEMIA 03/06/2009  . HYPERTENSION 07/15/2008  . Vertebral artery stenosis   . Stroke [facility])    Past Surgical History  Procedure Laterality Date  . Abdominal hysterectomy    . Foot surgery     Family History  Problem Relation Age of Onset  . Diabetes Sister    Social History  Substance Use Topics  . Smoking status: Never Smoker   . Smokeless tobacco: Never Used  . Alcohol Use: No   OB History    No data available     Review of Systems  Constitutional: Negative for chills.  HENT: Negative for congestion.   Eyes: Negative for pain and itching.  Respiratory: Negative for choking and shortness of breath.   Cardiovascular: Negative for chest pain.  Endocrine: Negative for polydipsia and polyuria.  Genitourinary: Positive for flank pain.  Neurological: Negative for headaches.  All other systems reviewed and are negative.     Allergies  Review of patient's allergies indicates no known allergies.  Home Medications   Prior to Admission medications   Medication Sig Start Date End Date Taking? Authorizing  Provider  amLODipine (NORVASC) 10 MG tablet TAKE ONE TABLET BY MOUTH ONCE DAILY 05/18/15  Yes [name], MD  chlorthalidone (HYGROTON) 25 MG tablet TAKE ONE TABLET BY MOUTH ONCE DAILY **NEED  FOLLOW  UP  APPOINTMENT** 02/23/15  Yes [name], MD  clopidogrel (PLAVIX) 75 MG tablet TAKE ONE TABLET BY MOUTH ONCE DAILY 04/13/15  Yes [name], MD  FLUoxetine (PROZAC) 20 MG capsule TAKE ONE CAPSULE BY MOUTH ONCE DAILY 04/13/15  Yes [name], MD  Insulin Glargine (LANTUS SOLOSTAR) 100 UNIT/ML Solostar Pen Inject 80 Units into the skin every morning. 07/29/15  Yes [name], MD  lisinopril (PRINIVIL,ZESTRIL) 40 MG tablet TAKE ONE TABLET BY MOUTH ONCE DAILY 04/13/15  Yes [name], MD  pantoprazole (PROTONIX) 40 MG tablet TAKE ONE TABLET BY MOUTH ONCE DAILY 02/09/15  Yes [name], MD  pravastatin (PRAVACHOL) 40 MG tablet TAKE ONE TABLET BY MOUTH ONCE DAILY 02/23/15  Yes [name], MD  traMADol (ULTRAM) 50 MG tablet Take 1 tablet (50 mg total) by mouth every 8 (eight) hours as needed for pain. 08/24/12  Yes [name], MD  triamcinolone ointment (KENALOG) 0.5 % APPLY  TWICE DAILY Patient taking differently: APPLY  TWICE DAILY AS NEEDED FOR RASH 08/13/11  Yes [name], MD  glucose blood [facility] VERIO) test strip USE TO CHECK BLOOD SUGAR TWICE A DAY AND PRN 07/29/15   [name], MD  Insulin Pen Needle (BD PEN NEEDLE  NANO U/F) 32G X 4 MM MISC Use to inject insulin every morning. 07/29/15   [name], MD  ONE TOUCH LANCETS MISC USE TO CHECK BLOOD SUGAR TWICE A DAY AND PRN 07/29/15   [name], MD   There were no vitals taken for this visit. Physical Exam  Constitutional: She is oriented to person, place, and time. She appears well-developed and well-nourished.  HENT:  Head: Normocephalic and atraumatic.  Neck: Normal range of motion.  Cardiovascular: Normal rate and regular rhythm.    Pulmonary/Chest: No stridor. No respiratory distress.  Abdominal: Soft. She exhibits no distension. There is no tenderness. There is no rebound.  Musculoskeletal: Normal range of motion. She exhibits no edema or tenderness (no midline back ttp).  Neurological: She is alert and oriented to person, place, and time. No cranial nerve deficit. She exhibits normal muscle tone.  Normal strength and sensation in lower extremities. Decreased but symmetrical DTR in patellas  Skin: Skin is warm and dry. No rash noted.  Nursing note and vitals reviewed.   ED Course  Procedures (including critical care time) Labs Review Labs Reviewed  CBC WITH DIFFERENTIAL/PLATELET - Abnormal; Notable for the following:    MCV 75.1 (*)    MCH 25.6 (*)    All other components within normal limits  COMPREHENSIVE METABOLIC PANEL - Abnormal; Notable for the following:    Chloride 100 (*)    Glucose, Bld 381 (*)    Albumin 3.3 (*)    All other components within normal limits  URINALYSIS, ROUTINE W REFLEX MICROSCOPIC (NOT AT [facility]) - Abnormal; Notable for the following:    Glucose, UA >1000 (*)    All other components within normal limits  URINE MICROSCOPIC-ADD ON - Abnormal; Notable for the following:    Squamous Epithelial / LPF 0-5 (*)    Bacteria, UA RARE (*)    All other components within normal limits  LIPASE, BLOOD    Imaging Review Ct Renal Stone Study  08/26/2015  CLINICAL DATA:  Left-sided flank pain for weeks with recent worsening. EXAM: CT ABDOMEN AND PELVIS WITHOUT CONTRAST TECHNIQUE: Multidetector CT imaging of the abdomen and pelvis was performed following the standard protocol without IV contrast. COMPARISON:  None. FINDINGS: Lower chest: Lung bases show no acute findings. Heart size normal. No pericardial or pleural effusion. Hepatobiliary: Liver and gallbladder are unremarkable. No biliary ductal dilatation. Pancreas: Negative. Spleen: Negative. Adrenals/Urinary Tract: Adrenal glands and right  kidney are unremarkable. Low-attenuation lesion off the lower pole left kidney measures 4.1 cm and is likely a cyst although definitive characterization is difficult without post-contrast imaging. Ureters are decompressed. There is a punctate calcification in the region of the lower left ureter (series 2, image 57) which is favored to represent a vascular calcification. Bladder is unremarkable. Stomach/Bowel: Stomach, small bowel, appendix and colon are unremarkable. Vascular/Lymphatic: Atherosclerotic calcification of the arterial vasculature without abdominal aortic aneurysm. Right common iliac lymph node measures 12 mm (series 2, image 53). No additional pathologically enlarged lymph nodes. Reproductive: Hysterectomy.  Ovaries are visualized. Other: No free fluid. Mesenteries and peritoneum are unremarkable. Tiny periumbilical hernia contains fat. Musculoskeletal: No worrisome lytic or sclerotic lesions. IMPRESSION: 1. No acute findings to explain the patient's pain. A punctate calcification in the region of the lower left ureter is favored to represent an adjacent vascular calcification. 2. Minimally enlarged right common iliac lymph node, of uncertain etiology. Consider follow-up CT abdomen pelvis without contrast in 6-8 weeks in further evaluation, as clinically indicated.  Electronically Signed   By: Lorin Picket M.D.   On: 08/26/2015 08:52   I have personally reviewed and evaluated these images and lab results as part of my medical decision-making.   EKG Interpretation None      MDM   Final diagnoses:  Flank pain   Early shingles v pyelo v kidney stone.   Negative workup. Possibly early shingles, no rash. Will refer to PCP tomorrow for further workup.   New Prescriptions: Discharge Medication List as of 08/26/2015  9:18 AM      I have personally and contemperaneously reviewed labs and imaging and used in my decision making as above.   A medical screening exam was performed and I  feel the patient has had an appropriate workup for their chief complaint at this time and likelihood of emergent condition existing is low. Their vital signs are stable. They have been counseled on decision, discharge, follow up and which symptoms necessitate immediate return to the emergency department.  They verbally stated understanding and agreement with plan and discharged in stable condition.      Merrily Pew, MD 08/26/15 [phone]

## 2015-08-26 NOTE — ED Notes (Signed)
Pt states that she has been having left flank pain for awhile; pt states that it has become more consistent and is now a "stinging" pain; pt denies urinary sx; pt denies N/V

## 2015-09-02 ENCOUNTER — Telehealth: Payer: Self-pay | Admitting: Internal Medicine

## 2015-09-02 NOTE — Telephone Encounter (Signed)
Pt would like to let you know that her insulin will be coming to the office and she will pickup Thurs 5/4 or Fri 5/5 around lunch time .  The program that pt is on request that the insulin come to the office and not her resident pt is qualified up to a year and after that she will have to reapply.

## 2015-09-03 NOTE — Telephone Encounter (Signed)
Pt did not listen to her message. Advised pt of the message below and she will be her tomorrow to pick up.

## 2015-09-03 NOTE — Telephone Encounter (Signed)
Left detailed message on voicemail that I received pt's insulin Lantus I have it in the refrigerator. When you come in just ask for [facility] Dr. Marthann Schiller nurse and I will get it for you. Any questions please call office.

## 2015-09-11 ENCOUNTER — Other Ambulatory Visit: Payer: Commercial Managed Care - PPO

## 2015-09-11 ENCOUNTER — Other Ambulatory Visit (INDEPENDENT_AMBULATORY_CARE_PROVIDER_SITE_OTHER): Payer: Commercial Managed Care - PPO

## 2015-09-11 DIAGNOSIS — Z0000 Encounter for general adult medical examination without abnormal findings: Secondary | ICD-10-CM

## 2015-09-11 LAB — CBC WITH DIFFERENTIAL/PLATELET
BASOS PCT: 0.4 % (ref 0.0–3.0)
Basophils Absolute: 0 10*3/uL (ref 0.0–0.1)
EOS ABS: 0 10*3/uL (ref 0.0–0.7)
Eosinophils Relative: 0.9 % (ref 0.0–5.0)
HEMATOCRIT: 39 % (ref 36.0–46.0)
HEMOGLOBIN: 13 g/dL (ref 12.0–15.0)
LYMPHS PCT: 44.8 % (ref 12.0–46.0)
Lymphs Abs: 2.4 10*3/uL (ref 0.7–4.0)
MCHC: 33.2 g/dL (ref 30.0–36.0)
MCV: 77.5 fl — ABNORMAL LOW (ref 78.0–100.0)
MONO ABS: 0.6 10*3/uL (ref 0.1–1.0)
Monocytes Relative: 11.4 % (ref 3.0–12.0)
Neutro Abs: 2.3 10*3/uL (ref 1.4–7.7)
Neutrophils Relative %: 42.5 % — ABNORMAL LOW (ref 43.0–77.0)
Platelets: 261 10*3/uL (ref 150.0–400.0)
RBC: 5.03 Mil/uL (ref 3.87–5.11)
RDW: 14.8 % (ref 11.5–15.5)
WBC: 5.3 10*3/uL (ref 4.0–10.5)

## 2015-09-11 LAB — POC URINALSYSI DIPSTICK (AUTOMATED)
BILIRUBIN UA: NEGATIVE
KETONES UA: NEGATIVE
Nitrite, UA: NEGATIVE
SPEC GRAV UA: 1.015
Urobilinogen, UA: 1
pH, UA: 6

## 2015-09-11 LAB — LIPID PANEL
CHOL/HDL RATIO: 7
Cholesterol: 260 mg/dL — ABNORMAL HIGH (ref 0–200)
HDL: 35.9 mg/dL — AB (ref >39.00)
LDL CALC: 204 mg/dL — AB (ref 0–99)
NONHDL: 224.12
Triglycerides: 102 mg/dL (ref 0.0–149.0)
VLDL: 20.4 mg/dL (ref 0.0–40.0)

## 2015-09-11 LAB — BASIC METABOLIC PANEL
BUN: 14 mg/dL (ref 6–23)
CHLORIDE: 99 meq/L (ref 96–112)
CO2: 30 mEq/L (ref 19–32)
CREATININE: 0.68 mg/dL (ref 0.40–1.20)
Calcium: 9.7 mg/dL (ref 8.4–10.5)
GFR: 114.97 mL/min (ref >60.00)
Glucose, Bld: 271 mg/dL — ABNORMAL HIGH (ref 70–99)
POTASSIUM: 3.6 meq/L (ref 3.5–5.1)
SODIUM: 137 meq/L (ref 135–145)

## 2015-09-11 LAB — TSH: TSH: 1.14 u[IU]/mL (ref 0.35–4.50)

## 2015-09-11 LAB — HEPATIC FUNCTION PANEL
ALK PHOS: 83 U/L (ref 39–117)
ALT: 13 U/L (ref 0–35)
AST: 12 U/L (ref 0–37)
Albumin: 3.9 g/dL (ref 3.5–5.2)
BILIRUBIN DIRECT: 0.1 mg/dL (ref 0.0–0.3)
BILIRUBIN TOTAL: 0.4 mg/dL (ref 0.2–1.2)
TOTAL PROTEIN: 7.5 g/dL (ref 6.0–8.3)

## 2015-09-11 LAB — MICROALBUMIN / CREATININE URINE RATIO
Creatinine,U: 88.3 mg/dL
Microalb Creat Ratio: 1.9 mg/g (ref 0.0–30.0)
Microalb, Ur: 1.7 mg/dL (ref 0.0–1.9)

## 2015-09-11 LAB — HEMOGLOBIN A1C
HEMOGLOBIN A1C: 15.6 % — AB (ref <5.7)
Mean Plasma Glucose: 401 mg/dL

## 2015-09-15 ENCOUNTER — Other Ambulatory Visit: Payer: Self-pay | Admitting: Internal Medicine

## 2015-09-15 DIAGNOSIS — E1151 Type 2 diabetes mellitus with diabetic peripheral angiopathy without gangrene: Secondary | ICD-10-CM

## 2015-09-18 ENCOUNTER — Encounter: Payer: Self-pay | Admitting: Internal Medicine

## 2015-09-18 ENCOUNTER — Ambulatory Visit (INDEPENDENT_AMBULATORY_CARE_PROVIDER_SITE_OTHER): Payer: Commercial Managed Care - PPO | Admitting: Internal Medicine

## 2015-09-18 VITALS — BP 130/78 | HR 59 | Temp 98.1°F | Ht 63.5 in | Wt 188.0 lb

## 2015-09-18 DIAGNOSIS — Z0000 Encounter for general adult medical examination without abnormal findings: Secondary | ICD-10-CM | POA: Diagnosis not present

## 2015-09-18 DIAGNOSIS — I10 Essential (primary) hypertension: Secondary | ICD-10-CM | POA: Diagnosis not present

## 2015-09-18 DIAGNOSIS — E785 Hyperlipidemia, unspecified: Secondary | ICD-10-CM

## 2015-09-18 DIAGNOSIS — I679 Cerebrovascular disease, unspecified: Secondary | ICD-10-CM | POA: Diagnosis not present

## 2015-09-18 DIAGNOSIS — E1151 Type 2 diabetes mellitus with diabetic peripheral angiopathy without gangrene: Secondary | ICD-10-CM

## 2015-09-18 MED ORDER — ATORVASTATIN CALCIUM 80 MG PO TABS: 80.0000 mg | ORAL_TABLET | Freq: Every day | ORAL | Status: DC

## 2015-09-18 NOTE — Patient Instructions (Signed)
Endocrine follow-up as scheduled for next week   Please check your hemoglobin A1c every 3 months  Limit your sodium (Salt) intake    It is important that you exercise regularly, at least 20 minutes 3 to 4 times per week.  If you develop chest pain or shortness of breath seek  medical attention.  You need to lose weight.  Consider a lower calorie diet and regular exercise.

## 2015-09-18 NOTE — Progress Notes (Signed)
Subjective:    Patient ID: [name], female    DOB: 1961-02-14, 55 y.o.   MRN: [id number]  HPI    Lab Results  Component Value Date   HGBA1C 15.6* 09/11/2015   55 year old patient who is seen today for a preventive health examination. She has a history of poorly controlled diabetes.  In the past, she has been noncompliant due to cost considerations.  She has obtained the patient assistance and has been on 80 units of Lantus for the past 2 weeks.  More recently she states her blood sugars have been much better controlled.  She states her fasting blood sugar earlier today was 168, and after lunch 134.  Blood draw last week, however, revealed a fasting blood sugar of 271.  She did see ophthalmology in September and has no retinopathy. She is seen by gynecology annually.  She has had a hysterectomy due to fibroids and uncontrolled uterine bleeding  She has a history of cerebral vascular disease and remains on antiplatelet therapy.  No focal neurological complaints  Cardiovascular risk factors include dyslipidemia and essential hypertension.  Her blood pressure has been well controlled on triple therapy.  Laboratory studies reviewed and revealed poorly controlled dyslipidemia.  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 hospitalized 3 months at [facility] history of asthma history of uterine fibroids Eczema Hyperlipidemia  Past Surgical History: gravida one, para zero, abortus one foot surgery 2000 colonoscopy 2011 status post hysterectomy 2003  Family History:  father died age 47, stomach cancer mother died at 70, complications of hypertension, and cerebrovascular disease  One brother, history of asthma two sisters positive for hypertension,  diabetes, cardiopathy hepatitis, and thyroid disease  Social History:  Divorced Regular exercise-yes Does Patient Exercise: yes  Past Medical History  Diagnosis Date  . Abnormality of gait 05/10/2010  . BACK PAIN 11/14/2008  . CEREBROVASCULAR DISEASE 08/05/2008  . DIABETES MELLITUS, TYPE II 07/15/2008  . Diplopia 07/15/2008  . ECZEMA, ATOPIC 04/03/2009  . HYPERLIPIDEMIA 03/06/2009  . HYPERTENSION 07/15/2008  . Vertebral artery stenosis   . Stroke [facility])      Social History   Social History  . Marital Status: Single    Spouse Name: N/A  . Number of Children: N/A  . Years of Education: N/A   Occupational History  . Not on file.   Social History Main Topics  . Smoking status: Never Smoker   . Smokeless tobacco: Never Used  . Alcohol Use: No  . Drug Use: No  . Sexual Activity: Not on file   Other Topics Concern  . Not on file   Social History Narrative    Past Surgical History  Procedure Laterality Date  . Abdominal hysterectomy    . Foot surgery      Family History  Problem Relation Age of Onset  . Diabetes Sister     No Known Allergies  Current Outpatient Prescriptions on File Prior to Visit  Medication Sig Dispense Refill  . amLODipine (NORVASC) 10 MG tablet TAKE ONE TABLET BY MOUTH ONCE DAILY 90 tablet 0  . chlorthalidone (HYGROTON) 25 MG tablet TAKE ONE TABLET BY MOUTH ONCE DAILY **NEED  FOLLOW  UP  APPOINTMENT** 90 tablet 0  . clopidogrel (PLAVIX) 75 MG tablet TAKE ONE TABLET BY MOUTH ONCE DAILY 90 tablet 0  .  FLUoxetine (PROZAC) 20 MG capsule TAKE ONE CAPSULE BY MOUTH ONCE DAILY 90 capsule 0  . glucose blood (ONETOUCH VERIO) test strip USE TO CHECK BLOOD SUGAR TWICE A DAY AND PRN 100 each 6  . Insulin Glargine (LANTUS SOLOSTAR) 100 UNIT/ML Solostar Pen Inject 80 Units into the skin every morning. 50 pen 0  . Insulin Pen Needle (BD PEN NEEDLE NANO U/F) 32G X 4 MM MISC Use to inject insulin every morning. 100 each 6  . lisinopril (PRINIVIL,ZESTRIL) 40 MG  tablet TAKE ONE TABLET BY MOUTH ONCE DAILY 90 tablet 0  . ONE TOUCH LANCETS MISC USE TO CHECK BLOOD SUGAR TWICE A DAY AND PRN 100 each 6  . pantoprazole (PROTONIX) 40 MG tablet TAKE ONE TABLET BY MOUTH ONCE DAILY 90 tablet 1  . traMADol (ULTRAM) 50 MG tablet Take 1 tablet (50 mg total) by mouth every 8 (eight) hours as needed for pain. 30 tablet 0  . triamcinolone ointment (KENALOG) 0.5 % APPLY  TWICE DAILY (Patient taking differently: APPLY  TWICE DAILY AS NEEDED FOR RASH) 30 g 5   No current facility-administered medications on file prior to visit.    BP 130/78 mmHg  Pulse 59  Temp(Src) 98.1 F (36.7 C) (Oral)  Ht 5' 3.5" (1.613 m)  Wt 188 lb (85.276 kg)  BMI 32.78 kg/m2  SpO2 99%     Review of Systems  Constitutional: Negative for fever, appetite change, fatigue and unexpected weight change.  HENT: Negative for congestion, dental problem, ear pain, hearing loss, mouth sores, nosebleeds, sinus pressure, sore throat, tinnitus, trouble swallowing and voice change.   Eyes: Negative for photophobia, pain, redness and visual disturbance.  Respiratory: Negative for cough, chest tightness and shortness of breath.   Cardiovascular: Negative for chest pain, palpitations and leg swelling.  Gastrointestinal: Negative for nausea, vomiting, abdominal pain, diarrhea, constipation, blood in stool, abdominal distention and rectal pain.  Genitourinary: Negative for dysuria, urgency, frequency, hematuria, flank pain, vaginal bleeding, vaginal discharge, difficulty urinating, genital sores, vaginal pain, menstrual problem and pelvic pain.  Musculoskeletal: Negative for back pain, arthralgias and neck stiffness.  Skin: Negative for rash.  Neurological: Negative for dizziness, syncope, speech difficulty, weakness, light-headedness, numbness and headaches.  Hematological: Negative for adenopathy. Does not bruise/bleed easily.  Psychiatric/Behavioral: Negative for suicidal ideas, behavioral problems,  self-injury, dysphoric mood and agitation. The patient is not nervous/anxious.        Objective:   Physical Exam  Constitutional: She is oriented to person, place, and time. She appears well-developed and well-nourished.  HENT:  Head: Normocephalic and atraumatic.  Right Ear: External ear normal.  Left Ear: External ear normal.  Mouth/Throat: Oropharynx is clear and moist.  Eyes: Conjunctivae and EOM are normal.  Neck: Normal range of motion. Neck supple. No JVD present. No thyromegaly present.  Cardiovascular: Normal rate, regular rhythm, normal heart sounds and intact distal pulses.   No murmur heard. Dorsalis pedis pulses full Posterior tibial pulses faint  Pulmonary/Chest: Effort normal and breath sounds normal. She has no wheezes. She has no rales.  Abdominal: Soft. Bowel sounds are normal. She exhibits no distension and no mass. There is no tenderness. There is no rebound and no guarding.  Genitourinary: Vagina normal.  Musculoskeletal: Normal range of motion. She exhibits no edema or tenderness.  Neurological: She is alert and oriented to person, place, and time. She has normal reflexes. No cranial nerve deficit. She exhibits normal muscle tone. Coordination normal.  Skin: Skin is warm and dry. No rash  noted.  Psychiatric: She has a normal mood and affect. Her behavior is normal.          Assessment & Plan:   Preventive health exam Uncontrolled diabetes.  Patient has an endocrine follow-up next week Hypertension, well-controlled.  Continue triple therapy Cerebral vascular disease, stable Dyslipidemia.  Not at goal.  Will discontinue pravastatin 40 and substitute  atorvastatin 80  Annual eye examination encouraged Endocrinology follow-up recommended  Lifestyle issues discussed including modest weight loss and more rigorous activities Return in 3 months for follow-up

## 2015-09-18 NOTE — Progress Notes (Signed)
Pre visit review using our clinic review tool, if applicable. No additional management support is needed unless otherwise documented below in the visit note. 

## 2015-09-25 ENCOUNTER — Ambulatory Visit (INDEPENDENT_AMBULATORY_CARE_PROVIDER_SITE_OTHER): Payer: Commercial Managed Care - PPO | Admitting: Endocrinology

## 2015-09-25 ENCOUNTER — Encounter: Payer: Self-pay | Admitting: Endocrinology

## 2015-09-25 VITALS — BP 110/70 | HR 60 | Temp 98.3°F | Resp 16 | Wt 185.2 lb

## 2015-09-25 DIAGNOSIS — E1151 Type 2 diabetes mellitus with diabetic peripheral angiopathy without gangrene: Secondary | ICD-10-CM

## 2015-09-25 MED ORDER — INSULIN GLARGINE 100 UNIT/ML SOLOSTAR PEN: 90.0000 [IU] | PEN_INJECTOR | SUBCUTANEOUS | Status: DC

## 2015-09-25 NOTE — Progress Notes (Signed)
Subjective:    Patient ID: [name], female    DOB: 1960-06-08, 55 y.o.   MRN: [id number]  HPI Pt returns for f/u of diabetes mellitus: DM type: Insulin-requiring type 2 Dx'ed: [id number] Complications: cerebrovascular disease Therapy: insulin since soon after dx GDM: never DKA: never Severe hypoglycemia: never Pancreatitis: never Other: She requests to take just 1 injection per day, as she is having difficulty remembering the multiple daily injections Interval history: Pt says she has not misses any insulin doses in approx 2 weeks.  no cbg record, but states since back on the insulin, cbg's vary from 130-200's.  There is no trend throughout the day.  pt states she feels well in general.   Past Medical History  Diagnosis Date  . Abnormality of gait 05/10/2010  . BACK PAIN 11/14/2008  . CEREBROVASCULAR DISEASE 08/05/2008  . DIABETES MELLITUS, TYPE II 07/15/2008  . Diplopia 07/15/2008  . ECZEMA, ATOPIC 04/03/2009  . HYPERLIPIDEMIA 03/06/2009  . HYPERTENSION 07/15/2008  . Vertebral artery stenosis   . Stroke [facility])     Past Surgical History  Procedure Laterality Date  . Abdominal hysterectomy    . Foot surgery      Social History   Social History  . Marital Status: Single    Spouse Name: N/A  . Number of Children: N/A  . Years of Education: N/A   Occupational History  . Not on file.   Social History Main Topics  . Smoking status: Never Smoker   . Smokeless tobacco: Never Used  . Alcohol Use: No  . Drug Use: No  . Sexual Activity: Not on file   Other Topics Concern  . Not on file   Social History Narrative    Current Outpatient Prescriptions on File Prior to Visit  Medication Sig Dispense Refill  . amLODipine (NORVASC) 10 MG tablet TAKE ONE TABLET BY MOUTH ONCE DAILY 90 tablet 0  . atorvastatin (LIPITOR) 80 MG tablet Take 1 tablet (80 mg total) by mouth daily. 90 tablet 3  . chlorthalidone (HYGROTON) 25 MG tablet TAKE ONE TABLET BY MOUTH ONCE DAILY **NEED  FOLLOW   UP  APPOINTMENT** 90 tablet 0  . clopidogrel (PLAVIX) 75 MG tablet TAKE ONE TABLET BY MOUTH ONCE DAILY 90 tablet 0  . FLUoxetine (PROZAC) 20 MG capsule TAKE ONE CAPSULE BY MOUTH ONCE DAILY 90 capsule 0  . glucose blood (ONETOUCH VERIO) test strip USE TO CHECK BLOOD SUGAR TWICE A DAY AND PRN 100 each 6  . Insulin Pen Needle (BD PEN NEEDLE NANO U/F) 32G X 4 MM MISC Use to inject insulin every morning. 100 each 6  . lisinopril (PRINIVIL,ZESTRIL) 40 MG tablet TAKE ONE TABLET BY MOUTH ONCE DAILY 90 tablet 0  . ONE TOUCH LANCETS MISC USE TO CHECK BLOOD SUGAR TWICE A DAY AND PRN 100 each 6  . pantoprazole (PROTONIX) 40 MG tablet TAKE ONE TABLET BY MOUTH ONCE DAILY 90 tablet 1  . traMADol (ULTRAM) 50 MG tablet Take 1 tablet (50 mg total) by mouth every 8 (eight) hours as needed for pain. 30 tablet 0  . triamcinolone ointment (KENALOG) 0.5 % APPLY  TWICE DAILY (Patient taking differently: APPLY  TWICE DAILY AS NEEDED FOR RASH) 30 g 5   No current facility-administered medications on file prior to visit.    No Known Allergies  Family History  Problem Relation Age of Onset  . Diabetes Sister     BP 110/70 mmHg  Pulse 60  Temp(Src) 98.3 F (36.8 C) (  Oral)  Resp 16  Wt 185 lb 4 oz (84.029 kg)  SpO2 96%  Review of Systems She denies hypoglycemia.      Objective:   Physical Exam VITAL SIGNS:  See vs page GENERAL: no distress Pulses: dorsalis pedis intact bilat.   MSK: no deformity of the feet CV: no leg edema Skin:  no ulcer on the feet.  normal color and temp on the feet. Neuro: sensation is intact to touch on the feet    Lab Results  Component Value Date   HGBA1C 15.6* 09/11/2015      Assessment & Plan:  DM: glycemic control is apparently improved Noncompliance with cbg recording and f/u appts, persistent:  However, compliance with insulin is much better.  She declines fructosamine today   Patient is advised the following: Patient Instructions  check your blood sugar twice  a day.  vary the time of day when you check, between before the 3 meals, and at bedtime.  also check if you have symptoms of your blood sugar being too high or too low.  please keep a record of the readings and bring it to your next appointment here.  You can write it on any piece of paper.  please call us sooner if your blood sugar goes below 70, or if you have a lot of readings over 200. Please increase the lantus to 90 units each morning.  i have sent a prescription to your pharmacy. Please come back for a follow-up appointment in 3 months.

## 2015-09-25 NOTE — Patient Instructions (Addendum)
check your blood sugar twice a day.  vary the time of day when you check, between before the 3 meals, and at bedtime.  also check if you have symptoms of your blood sugar being too high or too low.  please keep a record of the readings and bring it to your next appointment here.  You can write it on any piece of paper.  please call us sooner if your blood sugar goes below 70, or if you have a lot of readings over 200. Please increase the lantus to 90 units each morning.  i have sent a prescription to your pharmacy. Please come back for a follow-up appointment in 3 months.

## 2015-10-03 ENCOUNTER — Other Ambulatory Visit: Payer: Self-pay | Admitting: Internal Medicine

## 2015-10-05 NOTE — Telephone Encounter (Signed)
Pt would like to add amLODipine (NORVASC) 10 MG tablet  [facility]

## 2015-10-06 MED ORDER — AMLODIPINE BESYLATE 10 MG PO TABS: 10.0000 mg | ORAL_TABLET | Freq: Every day | ORAL | Status: DC

## 2015-10-24 ENCOUNTER — Other Ambulatory Visit: Payer: Self-pay | Admitting: Internal Medicine

## 2015-12-04 ENCOUNTER — Telehealth: Payer: Self-pay

## 2015-12-04 NOTE — Telephone Encounter (Signed)
Called patient to let her know that her Lantus had arrived and was ready for pick up. Pt verbalized understanding.

## 2015-12-25 ENCOUNTER — Ambulatory Visit: Payer: Commercial Managed Care - PPO | Admitting: Endocrinology

## 2016-02-09 ENCOUNTER — Telehealth: Payer: Self-pay | Admitting: *Deleted

## 2016-02-09 NOTE — Telephone Encounter (Signed)
Left message on voicemail to call office.Received fax from Patient Assistance seeing if pt needs refill on her Lantus.

## 2016-02-17 NOTE — Telephone Encounter (Signed)
Left message on voicemail to call office. Regarding refill for Lantus.

## 2016-02-18 NOTE — Telephone Encounter (Signed)
Pt called back, told her I received a fax asking if she needs a refill on your Lantus Insulin? Pt said yes. Told her okay I will order insulin and when it comes in we will contact you. Pt verbalized understanding.

## 2016-03-03 NOTE — Telephone Encounter (Signed)
Left message on voicemail Insulin has come in and is ready to be pickup. Please come by the office and ask for me. Any questions please call.

## 2016-03-04 NOTE — Telephone Encounter (Signed)
Pt came by office today and picked up Insulin Lantus, 5 boxes with 5 pens in each form Patient Assistance from [employer].

## 2016-03-17 ENCOUNTER — Other Ambulatory Visit: Payer: Self-pay | Admitting: Internal Medicine

## 2016-05-23 ENCOUNTER — Telehealth: Payer: Self-pay | Admitting: Internal Medicine

## 2016-05-23 NOTE — Telephone Encounter (Signed)
Left message on voicemail Insulin has come in and is ready to be pickup. Please come by the office and ask for [name]. Any questions please call office.

## 2016-05-25 ENCOUNTER — Emergency Department (HOSPITAL_COMMUNITY)
Admission: EM | Admit: 2016-05-25 | Discharge: 2016-05-26 | Disposition: A | Discharge status: Home / Self Care | Payer: Commercial Managed Care - PPO | Attending: Emergency Medicine | Admitting: Emergency Medicine

## 2016-05-25 ENCOUNTER — Encounter (HOSPITAL_COMMUNITY): Payer: Self-pay

## 2016-05-25 DIAGNOSIS — Z794 Long term (current) use of insulin: Secondary | ICD-10-CM | POA: Diagnosis not present

## 2016-05-25 DIAGNOSIS — Z8673 Personal history of transient ischemic attack (TIA), and cerebral infarction without residual deficits: Secondary | ICD-10-CM | POA: Diagnosis not present

## 2016-05-25 DIAGNOSIS — B029 Zoster without complications: Secondary | ICD-10-CM | POA: Insufficient documentation

## 2016-05-25 DIAGNOSIS — L0201 Cutaneous abscess of face: Secondary | ICD-10-CM | POA: Diagnosis present

## 2016-05-25 DIAGNOSIS — E119 Type 2 diabetes mellitus without complications: Secondary | ICD-10-CM | POA: Diagnosis not present

## 2016-05-25 DIAGNOSIS — I10 Essential (primary) hypertension: Secondary | ICD-10-CM | POA: Insufficient documentation

## 2016-05-25 HISTORY — DX: Guillain-Barre syndrome: G61.0

## 2016-05-25 NOTE — ED Triage Notes (Signed)
Patient c/o several abscessed area to the right side of her head, behind right ear, right face, right side of neck x 2 days.  Patient denies any drainage or fever.

## 2016-05-26 MED ORDER — VALACYCLOVIR HCL 1 G PO TABS: 1000.0000 mg | ORAL_TABLET | Freq: Three times a day (TID) | ORAL | 0 refills | Status: AC

## 2016-05-26 NOTE — Discharge Instructions (Signed)
Use valacyclovir as directed for your rash, which appears to be shingles. Use tylenol and motrin as needed for pain. Keep the areas clean and dry, avoid direct contact of your rash with anyone. Avoid scratching these areas. Follow up with your primary care doctor in the next 2-3 days for recheck of symptoms. Return to the ER for changes or worsening symptoms. Monitor area for signs of infection to include, but not limited to: increasing pain, spreading redness, drainage/pus, worsening swelling, or fevers.

## 2016-05-26 NOTE — ED Provider Notes (Signed)
[facility] DEPT Provider Note   CSN: [id number] Arrival date & time: 05/25/16  1803  By signing my name below, I, [name], attest that this documentation has been prepared under the direction and in the presence of [address] [place]. Electronically Signed: [name], ED Scribe. 05/26/16. 12:41 AM.  History   Chief Complaint Chief Complaint  Patient presents with  . Abscess   The history is provided by the patient and medical records. No language interpreter was used.  Abscess  Location:  Head/neck and face Head/neck abscess location:  R neck Facial abscess location:  Face Abscess quality: itching and painful   Abscess quality: not draining and no warmth   Red streaking: no   Progression:  Worsening Pain details:    Quality:  Burning   Severity:  Moderate   Duration:  2 days   Timing:  Constant   Progression:  Worsening Chronicity:  New Context: diabetes   Relieved by:  Nothing Worsened by:  Nothing Ineffective treatments:  None tried Associated symptoms: headaches (pain over rash on R head/neck/face)   Associated symptoms: no fever, no nausea and no vomiting    HPI Comments: [name] is a 56 y.o. female with PMHx of DM2, eczema, guillian barre syndrome, HTN, HLD, and remote CVA, with additional medical hx listed below, who presents to the Emergency Department complaining of a pruritic, painful rash behind the right ear and extending to the top of her scalp and down the right side of her neck and R face beginning today. She states the pain began 2 days before onset of rash. She describes pain as a gradual onset, constant, moderate, burning and throbbing 8/10 right sided facial/head pain that radiates down her neck and R ear. No exacerbating factors or alleviating factors noted. No treatments tried. Rash looks like small erythematous blisters. She denies contact with others with similar symptoms. No drainage from areas. No changes in soaps, creams, detergents,  animals/plants, or changes in meds. Pt has had a cold for the past few days with symptoms including intermittent sore throat, intermittent dry cough, congestion, and rhinorrhea. Denies wound drainage, warmth, swelling, fevers, chills, CP, SOB, abd pain, N/V/D/C, hematuria, dysuria, myalgias, arthralgias, numbness, tingling, weakness, eye itching, eye redness, blurred vision, syncope, lightheadedness, or any other complaints at this time.   Past Medical History:  Diagnosis Date  . Abnormality of gait 05/10/2010  . BACK PAIN 11/14/2008  . CEREBROVASCULAR DISEASE 08/05/2008  . DIABETES MELLITUS, TYPE II 07/15/2008  . Diplopia 07/15/2008  . ECZEMA, ATOPIC 04/03/2009  . Guillain-Barre ([place])   . HYPERLIPIDEMIA 03/06/2009  . HYPERTENSION 07/15/2008  . Stroke ([place])   . Vertebral artery stenosis     Patient Active Problem List   Diagnosis Date Noted  . DM (diabetes mellitus), type 2 with peripheral vascular complications ([place]) 99991111  . ABNORMALITY OF GAIT 05/10/2010  . ECZEMA, ATOPIC 04/03/2009  . Dyslipidemia 03/06/2009  . BACK PAIN 11/14/2008  . Cerebrovascular disease 08/05/2008  . DIPLOPIA 07/15/2008  . Essential hypertension 07/15/2008    Past Surgical History:  Procedure Laterality Date  . ABDOMINAL HYSTERECTOMY    . DILATION AND CURETTAGE OF UTERUS    . FOOT SURGERY      OB History    No data available       Home Medications    Prior to Admission medications   Medication Sig Start Date End Date Taking? Authorizing Provider  amLODipine (NORVASC) 10 MG tablet Take 1 tablet (10 mg total) by mouth  daily. 10/06/15   [name], MD  atorvastatin (LIPITOR) 80 MG tablet Take 1 tablet (80 mg total) by mouth daily. 09/18/15   [name], MD  chlorthalidone (HYGROTON) 25 MG tablet TAKE ONE TABLET BY MOUTH ONCE DAILY 10/06/15   [name], MD  clopidogrel (PLAVIX) 75 MG tablet TAKE ONE TABLET BY MOUTH ONCE DAILY 10/26/15   [name], MD  FLUoxetine  (PROZAC) 20 MG capsule TAKE ONE CAPSULE BY MOUTH ONCE DAILY 10/26/15   [name], MD  glucose blood [facility] VERIO) test strip USE TO CHECK BLOOD SUGAR TWICE A DAY AND PRN 07/29/15   [name], MD  Insulin Glargine (LANTUS SOLOSTAR) 100 UNIT/ML Solostar Pen Inject 90 Units into the skin every morning. 09/25/15   [name], MD  Insulin Pen Needle (BD PEN NEEDLE NANO U/F) 32G X 4 MM MISC Use to inject insulin every morning. 07/29/15   [name], MD  lisinopril (PRINIVIL,ZESTRIL) 40 MG tablet TAKE ONE TABLET BY MOUTH ONCE DAILY 10/26/15   [name], MD  ONE TOUCH LANCETS MISC USE TO CHECK BLOOD SUGAR TWICE A DAY AND PRN 07/29/15   [name], MD  pantoprazole (PROTONIX) 40 MG tablet TAKE ONE TABLET BY MOUTH ONCE DAILY 03/17/16   [name], MD  pravastatin (PRAVACHOL) 40 MG tablet TAKE ONE TABLET BY MOUTH ONCE DAILY 10/06/15   [name], MD  traMADol (ULTRAM) 50 MG tablet Take 1 tablet (50 mg total) by mouth every 8 (eight) hours as needed for pain. 08/24/12   [name], MD  triamcinolone ointment (KENALOG) 0.5 % APPLY  TWICE DAILY 10/06/15   [name], MD    Family History Family History  Problem Relation Age of Onset  . Diabetes Sister     Social History Social History  Substance Use Topics  . Smoking status: Never Smoker  . Smokeless tobacco: Never Used  . Alcohol use No     Allergies   Patient has no known allergies.   Review of Systems Review of Systems  Constitutional: Negative for chills and fever.  HENT: Positive for congestion, ear pain and rhinorrhea. Negative for ear discharge and sore throat.   Eyes: Negative for redness, itching and visual disturbance.  Respiratory: Positive for cough. Negative for shortness of breath.   Cardiovascular: Negative for chest pain.  Gastrointestinal: Negative for abdominal pain, constipation, diarrhea, nausea and vomiting.  Genitourinary: Negative for dysuria  and hematuria.  Musculoskeletal: Negative for arthralgias and myalgias.  Skin: Positive for rash. Negative for color change.  Allergic/Immunologic: Positive for immunocompromised state (DM2).  Neurological: Positive for headaches (pain over rash on R head/neck/face). Negative for weakness, light-headedness and numbness.  Psychiatric/Behavioral: Negative for confusion.  10 Systems reviewed and are negative for acute change except as noted in the HPI.   Physical Exam Updated Vital Signs BP 127/74 (BP Location: Right Arm)   Pulse 65   Temp 97.6 F (36.4 C) (Oral)   Resp 16   Ht 5\' 4"  (1.626 m)   Wt 195 lb (88.5 kg)   SpO2 98%   BMI 33.47 kg/m   Physical Exam  Constitutional: She is oriented to person, place, and time. Vital signs are normal. She appears well-developed and well-nourished.  Non-toxic appearance. No distress.  Afebrile, nontoxic, NAD  HENT:  Head: Normocephalic and atraumatic.  Right Ear: Hearing, tympanic membrane, external ear and ear canal normal.  Left Ear: Hearing, tympanic membrane, external ear  and ear canal normal.  Nose: Nose normal.  Mouth/Throat: Uvula is midline, oropharynx is clear and moist and mucous membranes are normal. No trismus in the jaw. No uvula swelling.  Vesicular rash extending from right lateral neck near hairline towards the right ear including behind the pina, and onto the right cheek, not crossing the midline. Vesicles mostly intact although some have opened up, dried blood noted behind the ear, no crusting or drainage, no warmth, no evidence of surrounding cellulitis. Ear canals and TMs are clear bilaterally. Nose clear. Oropharynx clear and moist, without uvular swelling or deviation, no trismus or drooling, no tonsillar swelling or erythema, no exudates.    Eyes: Conjunctivae and EOM are normal. Right eye exhibits no discharge. Left eye exhibits no discharge.  Neck: Normal range of motion. Neck supple.  Cardiovascular: Normal rate and  intact distal pulses.   Pulmonary/Chest: Effort normal. No respiratory distress.  Abdominal: Normal appearance. She exhibits no distension.  Musculoskeletal: Normal range of motion.  Lymphadenopathy:    She has cervical adenopathy.       Right cervical: Superficial cervical adenopathy present.  Shotty right sided cervical LAD which is mildly TTP  Neurological: She is alert and oriented to person, place, and time. She has normal strength. No sensory deficit.  Skin: Skin is warm, dry and intact. Rash noted. Rash is vesicular.  Vesicular erythematous to the right neck/ear/face as mentioned above  Psychiatric: She has a normal mood and affect. Her behavior is normal.  Nursing note and vitals reviewed.   ED Treatments / Results  DIAGNOSTIC STUDIES: Oxygen Saturation is 98% on RA, normal by my interpretation.    COORDINATION OF CARE: 12:38 AM Discussed treatment plan with pt at bedside which includes Valacyclovir and follow up with PCP and pt agreed to plan.  Labs (all labs ordered are listed, but only abnormal results are displayed) Labs Reviewed - No data to display  EKG  EKG Interpretation None       Radiology No results found.  Procedures Procedures (including critical care time)  Medications Ordered in ED Medications - No data to display   Initial Impression / Assessment and Plan / ED Course  I have reviewed the triage vital signs and the nursing notes.  Pertinent labs & imaging results that were available during my care of the patient were reviewed by me and considered in my medical decision making (see chart for details).     56 y.o. female here with rash to R face, with preceding pain; rash appears to be vesicular, looks like shingles. No hutchinson's sign, no visual/eye complaints. Does not cross midline. No s/sx of cellulitis, or superimposed infection. Likely shingles, advised wound care, contact precautions, tylenol/motrin for pain, and will start valacyclovir.  F/up with PCP in 3-4 days. I explained the diagnosis and have given explicit precautions to return to the ER including for any other new or worsening symptoms. The patient understands and accepts the medical plan as it's been dictated and I have answered their questions. Discharge instructions concerning home care and prescriptions have been given. The patient is STABLE and is discharged to home in good condition.   I personally performed the services described in this documentation, which was scribed in my presence. The recorded information has been reviewed and is accurate.  Final Clinical Impressions(s) / ED Diagnoses   Final diagnoses:  Herpes zoster without complication    New Prescriptions New Prescriptions   VALACYCLOVIR (VALTREX) 1000 MG TABLET    Take  1 tablet (1,000 mg total) by mouth 3 (three) times daily.     [address], PA-C 05/26/16 [id number]    April Palumbo, MD 05/26/16 [phone]

## 2016-05-30 ENCOUNTER — Ambulatory Visit (INDEPENDENT_AMBULATORY_CARE_PROVIDER_SITE_OTHER): Payer: Commercial Managed Care - PPO | Admitting: Internal Medicine

## 2016-05-30 ENCOUNTER — Telehealth: Payer: Self-pay | Admitting: Internal Medicine

## 2016-05-30 ENCOUNTER — Other Ambulatory Visit: Payer: Commercial Managed Care - PPO

## 2016-05-30 ENCOUNTER — Encounter: Payer: Self-pay | Admitting: Internal Medicine

## 2016-05-30 VITALS — BP 138/70 | HR 111 | Temp 98.7°F | Ht 64.0 in | Wt 189.0 lb

## 2016-05-30 DIAGNOSIS — E785 Hyperlipidemia, unspecified: Secondary | ICD-10-CM | POA: Diagnosis not present

## 2016-05-30 DIAGNOSIS — E1151 Type 2 diabetes mellitus with diabetic peripheral angiopathy without gangrene: Secondary | ICD-10-CM

## 2016-05-30 DIAGNOSIS — I10 Essential (primary) hypertension: Secondary | ICD-10-CM | POA: Diagnosis not present

## 2016-05-30 LAB — HEMOGLOBIN A1C
Hgb A1c MFr Bld: 12.2 % — ABNORMAL HIGH (ref <5.7)
Mean Plasma Glucose: 303 mg/dL

## 2016-05-30 MED ORDER — CANAGLIFLOZIN 300 MG PO TABS: 300.0000 mg | ORAL_TABLET | Freq: Every day | ORAL | 6 refills | Status: DC

## 2016-05-30 MED ORDER — METFORMIN HCL ER (MOD) 500 MG PO TB24: 1000.0000 mg | ORAL_TABLET | Freq: Every day | ORAL | 4 refills | Status: DC

## 2016-05-30 NOTE — Progress Notes (Signed)
Subjective:    Patient ID: [name], female    DOB: 1960/08/28, 56 y.o.   MRN: [id number]  HPI  Lab Results  Component Value Date   HGBA1C 15.6 (H) 09/11/2015    BP Readings from Last 3 Encounters:  05/30/16 138/70  05/26/16 126/81  09/25/15 55/34   56 year old patient who is seen today after a recent ED visit for shingles.  She has had a resolving dermatitis involving her right facial and right lateral neck region.  She is completing antiviral therapy She has diabetes and remains on 90 units of Lantus insulin daily.  She has had a recent URI and states blood sugars have been quite elevated.  She states blood sugars are as low as 90 but this is rare.  She continues to work full time She has a history of hypertension and dyslipidemia No recent eye examination She does have a history of cerebrovascular disease  Past Medical History:  Diagnosis Date  . Abnormality of gait 05/10/2010  . BACK PAIN 11/14/2008  . CEREBROVASCULAR DISEASE 08/05/2008  . DIABETES MELLITUS, TYPE II 07/15/2008  . Diplopia 07/15/2008  . ECZEMA, ATOPIC 04/03/2009  . Guillain-Barre ([place])   . HYPERLIPIDEMIA 03/06/2009  . HYPERTENSION 07/15/2008  . Stroke ([place])   . Vertebral artery stenosis      Social History   Social History  . Marital status: Single    Spouse name: N/A  . Number of children: N/A  . Years of education: N/A   Occupational History  . Not on file.   Social History Main Topics  . Smoking status: Never Smoker  . Smokeless tobacco: Never Used  . Alcohol use No  . Drug use: No  . Sexual activity: Not on file   Other Topics Concern  . Not on file   Social History Narrative  . No narrative on file    Past Surgical History:  Procedure Laterality Date  . ABDOMINAL HYSTERECTOMY    . DILATION AND CURETTAGE OF UTERUS    . FOOT SURGERY      Family History  Problem Relation Age of Onset  . Diabetes Sister     No Known Allergies  Current Outpatient Prescriptions on File  Prior to Visit  Medication Sig Dispense Refill  . amLODipine (NORVASC) 10 MG tablet Take 1 tablet (10 mg total) by mouth daily. 90 tablet 3  . atorvastatin (LIPITOR) 80 MG tablet Take 1 tablet (80 mg total) by mouth daily. 90 tablet 3  . chlorthalidone (HYGROTON) 25 MG tablet TAKE ONE TABLET BY MOUTH ONCE DAILY 90 tablet 3  . clopidogrel (PLAVIX) 75 MG tablet TAKE ONE TABLET BY MOUTH ONCE DAILY 90 tablet 3  . FLUoxetine (PROZAC) 20 MG capsule TAKE ONE CAPSULE BY MOUTH ONCE DAILY 90 capsule 3  . glucose blood (ONETOUCH VERIO) test strip USE TO CHECK BLOOD SUGAR TWICE A DAY AND PRN 100 each 6  . Insulin Glargine (LANTUS SOLOSTAR) 100 UNIT/ML Solostar Pen Inject 90 Units into the skin every morning. 50 pen 3  . Insulin Pen Needle (BD PEN NEEDLE NANO U/F) 32G X 4 MM MISC Use to inject insulin every morning. 100 each 6  . lisinopril (PRINIVIL,ZESTRIL) 40 MG tablet TAKE ONE TABLET BY MOUTH ONCE DAILY 90 tablet 3  . ONE TOUCH LANCETS MISC USE TO CHECK BLOOD SUGAR TWICE A DAY AND PRN 100 each 6  . pantoprazole (PROTONIX) 40 MG tablet TAKE ONE TABLET BY MOUTH ONCE DAILY 30 tablet 5  . pravastatin (PRAVACHOL)  40 MG tablet TAKE ONE TABLET BY MOUTH ONCE DAILY 90 tablet 3  . traMADol (ULTRAM) 50 MG tablet Take 1 tablet (50 mg total) by mouth every 8 (eight) hours as needed for pain. 30 tablet 0  . triamcinolone ointment (KENALOG) 0.5 % APPLY  TWICE DAILY 15 g 1  . valACYclovir (VALTREX) 1000 MG tablet Take 1 tablet (1,000 mg total) by mouth 3 (three) times daily. 21 tablet 0   No current facility-administered medications on file prior to visit.     BP 138/70 (BP Location: Left Arm, Patient Position: Sitting, Cuff Size: Normal)   Pulse (!) 111   Temp 98.7 F (37.1 C) (Oral)   Ht 5\' 4"  (1.626 m)   Wt 189 lb (85.7 kg)   SpO2 97%   BMI 32.44 kg/m    Review of Systems  HENT: Negative for congestion, dental problem, hearing loss, rhinorrhea, sinus pressure, sore throat and tinnitus.   Eyes: Negative  for pain, discharge and visual disturbance.  Respiratory: Negative for cough and shortness of breath.   Cardiovascular: Negative for chest pain, palpitations and leg swelling.  Gastrointestinal: Negative for abdominal distention, abdominal pain, blood in stool, constipation, diarrhea, nausea and vomiting.  Genitourinary: Negative for difficulty urinating, dysuria, flank pain, frequency, hematuria, pelvic pain, urgency, vaginal bleeding, vaginal discharge and vaginal pain.  Musculoskeletal: Negative for arthralgias, gait problem and joint swelling.  Skin: Positive for rash.  Neurological: Positive for weakness and headaches. Negative for dizziness, syncope, speech difficulty and numbness.  Hematological: Negative for adenopathy.  Psychiatric/Behavioral: Negative for agitation, behavioral problems and dysphoric mood. The patient is not nervous/anxious.        Objective:   Physical Exam  Constitutional: She is oriented to person, place, and time. She appears well-developed and well-nourished.  Blood pressure 120/70 Pulse 100  HENT:  Head: Normocephalic.  Right Ear: External ear normal.  Left Ear: External ear normal.  Mouth/Throat: Oropharynx is clear and moist.  Eyes: Conjunctivae and EOM are normal. Pupils are equal, round, and reactive to light.  Neck: Normal range of motion. Neck supple. No thyromegaly present.  Cardiovascular: Normal rate, regular rhythm and normal heart sounds.   The right dorsalis pedis pulses full.  Other peripheral pulses not easily palpable  Pulmonary/Chest: Effort normal and breath sounds normal.  Abdominal: Soft. Bowel sounds are normal. She exhibits no mass. There is no tenderness.  Musculoskeletal: Normal range of motion.  Lymphadenopathy:    She has no cervical adenopathy.  Neurological: She is alert and oriented to person, place, and time.  Skin: Skin is warm and dry. No rash noted.  Resolving herpetic rash involving the right facial and right lateral  neck regions  Psychiatric: She has a normal mood and affect. Her behavior is normal.          Assessment & Plan:   Resolving shingles Essential hypertension, stable Diabetes mellitus.  Poor control.  Will check hemoglobin A1c.  Will continue basal insulin.  We'll start on once daily.  Metformin therapy as well as SGLT2 antagonist (no h/o DKA) .Will check coverage Essential hypertension.  We'll check updated lab Dyslipidemia.  Continue statin therapy Cerebrovascular disease.  Continue Plavix Poor compliance  Follow-up endocrinology.  Encouraged Follow-up eye examination encouraged  Return here in 3 months  Keyen Marban Pilar Plate

## 2016-05-30 NOTE — Patient Instructions (Addendum)
Limit your sodium (Salt) intake   Please check your hemoglobin A1c every 3 months    It is important that you exercise regularly, at least 20 minutes 3 to 4 times per week.  If you develop chest pain or shortness of breath seek  medical attention.  Please see your eye doctor yearly to check for diabetic eye damage   Shingles Shingles is an infection that causes a painful skin rash and fluid-filled blisters. Shingles is caused by the same virus that causes chickenpox. Shingles only develops in people who:  Have had chickenpox.  Have gotten the chickenpox vaccine. (This is rare.) The first symptoms of shingles may be itching, tingling, or pain in an area on your skin. A rash will follow in a few days or weeks. The rash is usually on one side of the body in a bandlike or beltlike pattern. Over time, the rash turns into fluid-filled blisters that break open, scab over, and dry up. Medicines may:  Help you manage pain.  Help you recover more quickly.  Help to prevent long-term problems. Follow these instructions at home: Medicines  Take medicines only as told by your doctor.  Apply an anti-itch or numbing cream to the affected area as told by your doctor. Blister and Rash Care  Take a cool bath or put cool compresses on the area of the rash or blisters as told by your doctor. This may help with pain and itching.  Keep your rash covered with a loose bandage (dressing). Wear loose-fitting clothing.  Keep your rash and blisters clean with mild soap and cool water or as told by your doctor.  Check your rash every day for signs of infection. These include redness, swelling, and pain that lasts or gets worse.  Do not pick your blisters.  Do not scratch your rash. General instructions  Rest as told by your doctor.  Keep all follow-up visits as told by your doctor. This is important.  Until your blisters scab over, your infection can cause chickenpox in people who have never had it  or been vaccinated against it. To prevent this from happening, avoid touching other people or being around other people, especially:  Babies.  Pregnant women.  Children who have eczema.  Elderly people who have transplants.  People who have chronic illnesses, such as leukemia or AIDS. Contact a doctor if:  Your pain does not get better with medicine.  Your pain does not get better after the rash heals.  Your rash looks infected. Signs of infection include:  Redness.  Swelling.  Pain that lasts or gets worse. Get help right away if:  The rash is on your face or nose.  You have pain in your face, pain around your eye area, or loss of feeling on one side of your face.  You have ear pain or you have ringing in your ear.  You have loss of taste.  Your condition gets worse. This information is not intended to replace advice given to you by your health care provider. Make sure you discuss any questions you have with your health care provider. Document Released: 10/05/2007 Document Revised: 12/13/2015 Document Reviewed: 01/28/2014 Elsevier Interactive Patient Education  2017 [employer].

## 2016-05-30 NOTE — Telephone Encounter (Signed)
See message below, Please advise 

## 2016-05-30 NOTE — Progress Notes (Signed)
Pre visit review using our clinic review tool, if applicable. No additional management support is needed unless otherwise documented below in the visit note. 

## 2016-05-30 NOTE — Telephone Encounter (Signed)
Have patient take samples of Invokanna and then discontinue Ask  patient to shop around for generic metformin.  That should be a very inexpensive medication. Follow-up endocrinology

## 2016-05-30 NOTE — Telephone Encounter (Signed)
Patient states that metformin is too expensive and need to be change to something cheaper and the canagliflozin (INVOKANA) 300 MG TABS tablet are too expensive too .  Contact Info: [phone]

## 2016-05-31 ENCOUNTER — Telehealth: Payer: Self-pay

## 2016-05-31 NOTE — Telephone Encounter (Signed)
No samples of Invokanna in the office offered a coupon for a 30 day trail . Pt agreed to stop by office and pick it up.  Asked patient to shop around for generic metformin. Also to schedule a follow-up  appointment with endocrinology. Pt verbalized understanding.

## 2016-05-31 NOTE — Telephone Encounter (Signed)
Received PA request from [facility] for Glumetza 500 mg tablets. PA submitted & is pending. Key: [id number]

## 2016-06-02 ENCOUNTER — Ambulatory Visit: Payer: Commercial Managed Care - PPO | Admitting: Internal Medicine

## 2016-06-03 ENCOUNTER — Telehealth: Payer: Self-pay | Admitting: Internal Medicine

## 2016-06-03 NOTE — Telephone Encounter (Signed)
Please advise 

## 2016-06-03 NOTE — Telephone Encounter (Signed)
No further Valtrex needed

## 2016-06-03 NOTE — Telephone Encounter (Signed)
° ° ° ° °  Pt call to say the hospital gave her a RX for [employer] for shingles. She is asking if she need to continue to take the med she only has 1 pill left and no refills . Would like a call back

## 2016-06-06 NOTE — Telephone Encounter (Signed)
Left message for pt to call office back if she had any further questions or concerns.

## 2016-06-07 NOTE — Telephone Encounter (Signed)
PA approved, form faxed back to pharmacy. 

## 2016-09-29 ENCOUNTER — Telehealth: Payer: Self-pay | Admitting: Internal Medicine

## 2016-09-29 NOTE — Telephone Encounter (Signed)
Please advise 

## 2016-09-29 NOTE — Telephone Encounter (Signed)
° ° °  Pt ask if her paperwork had been sent in for the below med that she receives here in the office and she comes to pick up   Insulin Glargine (LANTUS SOLOSTAR) 100 UNIT/ML Solostar Pen   Pt also said that since she has been taking metformin she has is having issues with constipation would like a call back

## 2016-09-30 NOTE — Telephone Encounter (Signed)
Notify patient of metformin usually causes diarrhea Asked patient to increase fluid intake, increase fiber intake and activity and to use a laxative such as MiraLAX as needed

## 2016-09-30 NOTE — Telephone Encounter (Signed)
Left message on machine for patient to return our call 

## 2016-10-03 NOTE — Telephone Encounter (Signed)
Patient is aware of instructions of how to take her medication.  She would like to know if paperwork has been completed for there Lantus?

## 2016-10-05 NOTE — Telephone Encounter (Signed)
Please see message below, please advise 

## 2016-10-10 NOTE — Telephone Encounter (Signed)
I have not yet received any paperwork for Lantus

## 2016-11-01 ENCOUNTER — Other Ambulatory Visit: Payer: Self-pay | Admitting: Internal Medicine

## 2016-11-04 ENCOUNTER — Encounter: Payer: Self-pay | Admitting: Internal Medicine

## 2016-11-04 ENCOUNTER — Ambulatory Visit (INDEPENDENT_AMBULATORY_CARE_PROVIDER_SITE_OTHER): Payer: Commercial Managed Care - PPO | Admitting: Internal Medicine

## 2016-11-04 VITALS — BP 118/72 | HR 60 | Temp 98.6°F | Wt 187.0 lb

## 2016-11-04 DIAGNOSIS — E1151 Type 2 diabetes mellitus with diabetic peripheral angiopathy without gangrene: Secondary | ICD-10-CM

## 2016-11-04 DIAGNOSIS — E785 Hyperlipidemia, unspecified: Secondary | ICD-10-CM | POA: Diagnosis not present

## 2016-11-04 DIAGNOSIS — I10 Essential (primary) hypertension: Secondary | ICD-10-CM

## 2016-11-04 LAB — POCT GLYCOSYLATED HEMOGLOBIN (HGB A1C): Hemoglobin A1C: 14.5

## 2016-11-04 MED ORDER — INSULIN ASPART 100 UNIT/ML FLEXPEN: PEN_INJECTOR | SUBCUTANEOUS | 11 refills | Status: DC

## 2016-11-04 MED ORDER — INSULIN GLARGINE 100 UNIT/ML SOLOSTAR PEN: 100.0000 [IU] | PEN_INJECTOR | SUBCUTANEOUS | 3 refills | Status: DC

## 2016-11-04 NOTE — Progress Notes (Signed)
Subjective:    Patient ID: [name], female    DOB: March 20, 1961, 56 y.o.   MRN: [id number]  HPI Lab Results  Component Value Date   HGBA1C 12.2 (H) 05/30/2016   56 year old patient who is seen today for follow-up of diabetes. Remains on 90 units of Lantus insulin that she takes every morning.  She has been able to obtain this free of charge with a patient assistance program. She has had some difficulties with metformin with some abdominal upset.  Occasional diarrhea with incontinence No home blood sugar monitoring No recent endocrinology follow-up She states that she does have a eye examination scheduled No recent lab  Past Medical History:  Diagnosis Date  . Abnormality of gait 05/10/2010  . BACK PAIN 11/14/2008  . CEREBROVASCULAR DISEASE 08/05/2008  . DIABETES MELLITUS, TYPE II 07/15/2008  . Diplopia 07/15/2008  . ECZEMA, ATOPIC 04/03/2009  . Guillain-Barre ([place])   . HYPERLIPIDEMIA 03/06/2009  . HYPERTENSION 07/15/2008  . Stroke ([place])   . Vertebral artery stenosis      Social History   Social History  . Marital status: Single    Spouse name: N/A  . Number of children: N/A  . Years of education: N/A   Occupational History  . Not on file.   Social History Main Topics  . Smoking status: Never Smoker  . Smokeless tobacco: Never Used  . Alcohol use No  . Drug use: No  . Sexual activity: Not on file   Other Topics Concern  . Not on file   Social History Narrative  . No narrative on file    Past Surgical History:  Procedure Laterality Date  . ABDOMINAL HYSTERECTOMY    . DILATION AND CURETTAGE OF UTERUS    . FOOT SURGERY      Family History  Problem Relation Age of Onset  . Diabetes Sister     No Known Allergies  Current Outpatient Prescriptions on File Prior to Visit  Medication Sig Dispense Refill  . amLODipine (NORVASC) 10 MG tablet TAKE ONE TABLET BY MOUTH ONCE DAILY 10 tablet 35  . atorvastatin (LIPITOR) 80 MG tablet Take 1 tablet (80 mg total)  by mouth daily. 90 tablet 3  . canagliflozin (INVOKANA) 300 MG TABS tablet Take 1 tablet (300 mg total) by mouth daily before breakfast. 30 tablet 6  . chlorthalidone (HYGROTON) 25 MG tablet TAKE ONE TABLET BY MOUTH ONCE DAILY 10 tablet 35  . clopidogrel (PLAVIX) 75 MG tablet TAKE ONE TABLET BY MOUTH ONCE DAILY 10 tablet 35  . FLUoxetine (PROZAC) 20 MG capsule TAKE ONE CAPSULE BY MOUTH ONCE DAILY 10 capsule 35  . glucose blood (ONETOUCH VERIO) test strip USE TO CHECK BLOOD SUGAR TWICE A DAY AND PRN 100 each 6  . Insulin Glargine (LANTUS SOLOSTAR) 100 UNIT/ML Solostar Pen Inject 90 Units into the skin every morning. 50 pen 3  . Insulin Pen Needle (BD PEN NEEDLE NANO U/F) 32G X 4 MM MISC Use to inject insulin every morning. 100 each 6  . lisinopril (PRINIVIL,ZESTRIL) 40 MG tablet TAKE ONE TABLET BY MOUTH ONCE DAILY 10 tablet 35  . metFORMIN (GLUMETZA) 500 MG (MOD) 24 hr tablet Take 2 tablets (1,000 mg total) by mouth daily with breakfast. 180 tablet 4  . ONE TOUCH LANCETS MISC USE TO CHECK BLOOD SUGAR TWICE A DAY AND PRN 100 each 6  . pantoprazole (PROTONIX) 40 MG tablet TAKE ONE TABLET BY MOUTH ONCE DAILY 30 tablet 5  . pravastatin (PRAVACHOL) 40 MG  tablet TAKE ONE TABLET BY MOUTH ONCE DAILY 30 tablet 11  . traMADol (ULTRAM) 50 MG tablet Take 1 tablet (50 mg total) by mouth every 8 (eight) hours as needed for pain. 30 tablet 0  . triamcinolone ointment (KENALOG) 0.5 % APPLY  TWICE DAILY 15 g 1   No current facility-administered medications on file prior to visit.     BP 118/72 (BP Location: Left Arm, Patient Position: Sitting, Cuff Size: Normal)   Pulse 60   Temp 98.6 F (37 C) (Oral)   Wt 187 lb (84.8 kg)   SpO2 98%   BMI 32.10 kg/m   Wt Readings from Last 3 Encounters:  11/04/16 187 lb (84.8 kg)  05/30/16 189 lb (85.7 kg)  05/25/16 195 lb (88.5 kg)      Review of Systems  Constitutional: Negative.   HENT: Negative for congestion, dental problem, hearing loss, rhinorrhea, sinus  pressure, sore throat and tinnitus.   Eyes: Negative for pain, discharge and visual disturbance.  Respiratory: Negative for cough and shortness of breath.   Cardiovascular: Negative for chest pain, palpitations and leg swelling.  Gastrointestinal: Positive for abdominal pain, constipation and diarrhea. Negative for abdominal distention, blood in stool, nausea and vomiting.  Genitourinary: Negative for difficulty urinating, dysuria, flank pain, frequency, hematuria, pelvic pain, urgency, vaginal bleeding, vaginal discharge and vaginal pain.  Musculoskeletal: Negative for arthralgias, gait problem and joint swelling.  Skin: Negative for rash.  Neurological: Negative for dizziness, syncope, speech difficulty, weakness, numbness and headaches.  Hematological: Negative for adenopathy.  Psychiatric/Behavioral: Negative for agitation, behavioral problems and dysphoric mood. The patient is not nervous/anxious.        Objective:   Physical Exam  Constitutional: She is oriented to person, place, and time. She appears well-developed and well-nourished.  HENT:  Head: Normocephalic.  Right Ear: External ear normal.  Left Ear: External ear normal.  Mouth/Throat: Oropharynx is clear and moist.  Eyes: Conjunctivae and EOM are normal. Pupils are equal, round, and reactive to light.  Neck: Normal range of motion. Neck supple. No thyromegaly present.  Cardiovascular: Normal rate, regular rhythm, normal heart sounds and intact distal pulses.   Pulmonary/Chest: Effort normal and breath sounds normal.  Abdominal: Soft. Bowel sounds are normal. She exhibits no mass. There is no tenderness.  Musculoskeletal: Normal range of motion.  Lymphadenopathy:    She has no cervical adenopathy.  Neurological: She is alert and oriented to person, place, and time.  Skin: Skin is warm and dry. No rash noted.  Psychiatric: She has a normal mood and affect. Her behavior is normal.          Assessment & Plan:    Diabetes mellitus.  Will review a hemoglobin A1c. (14.5)   Patient agreeable to start mealtime insulin Essential hypertension, stable Cerebrovascular disease Dyslipidemia.  Continue statin therapy  Follow-up 3 months Endocrinology follow-up.  Encouraged  Nyoka Cowden

## 2016-11-04 NOTE — Patient Instructions (Addendum)
Follow-up with Dr. Loanne Drilling for diabetic management  Limit your sodium (Salt) intake  Please check your blood pressure on a regular basis.  If it is consistently greater than 150/90, please make an office appointment.    It is important that you exercise regularly, at least 20 minutes 3 to 4 times per week.  If you develop chest pain or shortness of breath seek  medical attention.  Please see your eye doctor yearly to check for diabetic eye damage  Take mealtime insulin as directed  Increase Lantus insulin to 100 units every morning   Please check your hemoglobin A1c every 3 months

## 2016-11-19 ENCOUNTER — Emergency Department (HOSPITAL_COMMUNITY)
Admission: EM | Admit: 2016-11-19 | Discharge: 2016-11-19 | Disposition: A | Discharge status: Home / Self Care | Payer: Commercial Managed Care - PPO | Attending: Emergency Medicine | Admitting: Emergency Medicine

## 2016-11-19 ENCOUNTER — Encounter (HOSPITAL_COMMUNITY): Payer: Self-pay | Admitting: Emergency Medicine

## 2016-11-19 DIAGNOSIS — Z733 Stress, not elsewhere classified: Secondary | ICD-10-CM | POA: Diagnosis not present

## 2016-11-19 DIAGNOSIS — R739 Hyperglycemia, unspecified: Secondary | ICD-10-CM

## 2016-11-19 DIAGNOSIS — Z79899 Other long term (current) drug therapy: Secondary | ICD-10-CM | POA: Insufficient documentation

## 2016-11-19 DIAGNOSIS — E1165 Type 2 diabetes mellitus with hyperglycemia: Secondary | ICD-10-CM | POA: Diagnosis not present

## 2016-11-19 LAB — BASIC METABOLIC PANEL
ANION GAP: 8 (ref 5–15)
BUN: 16 mg/dL (ref 6–20)
CALCIUM: 9.4 mg/dL (ref 8.9–10.3)
CO2: 27 mmol/L (ref 22–32)
CREATININE: 0.74 mg/dL (ref 0.44–1.00)
Chloride: 101 mmol/L (ref 101–111)
Glucose, Bld: 331 mg/dL — ABNORMAL HIGH (ref 65–99)
Potassium: 3.9 mmol/L (ref 3.5–5.1)
SODIUM: 136 mmol/L (ref 135–145)

## 2016-11-19 LAB — CBC
HCT: 37.2 % (ref 36.0–46.0)
HEMOGLOBIN: 12.9 g/dL (ref 12.0–15.0)
MCH: 26.3 pg (ref 26.0–34.0)
MCHC: 34.7 g/dL (ref 30.0–36.0)
MCV: 75.9 fL — ABNORMAL LOW (ref 78.0–100.0)
PLATELETS: 231 10*3/uL (ref 150–400)
RBC: 4.9 MIL/uL (ref 3.87–5.11)
RDW: 14.1 % (ref 11.5–15.5)
WBC: 5.4 10*3/uL (ref 4.0–10.5)

## 2016-11-19 LAB — URINALYSIS, ROUTINE W REFLEX MICROSCOPIC
BILIRUBIN URINE: NEGATIVE
Bacteria, UA: NONE SEEN
HGB URINE DIPSTICK: NEGATIVE
Ketones, ur: NEGATIVE mg/dL
NITRITE: NEGATIVE
PROTEIN: NEGATIVE mg/dL
RBC / HPF: NONE SEEN RBC/hpf (ref 0–5)
SPECIFIC GRAVITY, URINE: 1.021 (ref 1.005–1.030)
pH: 7 (ref 5.0–8.0)

## 2016-11-19 LAB — CBG MONITORING, ED
Glucose-Capillary: 326 mg/dL — ABNORMAL HIGH (ref 65–99)
Glucose-Capillary: 263 mg/dL — ABNORMAL HIGH (ref 65–99)

## 2016-11-19 MED ORDER — INSULIN ASPART 100 UNIT/ML FLEXPEN: PEN_INJECTOR | SUBCUTANEOUS | 0 refills | Status: DC

## 2016-11-19 MED ORDER — INSULIN ASPART PROT & ASPART (70-30 MIX) 100 UNIT/ML ~~LOC~~ SUSP: 20.0000 [IU] | Freq: Once | SUBCUTANEOUS | Status: DC

## 2016-11-19 MED ORDER — INSULIN ASPART 100 UNIT/ML ~~LOC~~ SOLN: 12.0000 [IU] | Freq: Once | SUBCUTANEOUS | Status: DC

## 2016-11-19 MED ORDER — INSULIN ASPART PROT & ASPART (70-30 MIX) 100 UNIT/ML ~~LOC~~ SUSP
12.0000 [IU] | Freq: Once | SUBCUTANEOUS | Status: AC
  Administered 2016-11-19: 12 [IU] via SUBCUTANEOUS
  Filled 2016-11-19: qty 10

## 2016-11-19 MED ORDER — SODIUM CHLORIDE 0.9 % IV BOLUS (SEPSIS)
1000.0000 mL | Freq: Once | INTRAVENOUS | Status: AC
  Administered 2016-11-19: 1000 mL via INTRAVENOUS

## 2016-11-19 NOTE — ED Provider Notes (Signed)
[facility] DEPT Provider Note   CSN: [id number] Arrival date & time: 11/19/16  1310     History   Chief Complaint Chief Complaint  Patient presents with  . Hyperglycemia    HPI [name] is a 56 y.o. female.  HPI   Presents with concern for hyperglycemia. Patient reports she has been under a lot of stress with work and anniversary of her sister's death from complications of DM. Reports heraunt died around a similar time. Reports with these combinations of stress and work that she has been having depression, fatigue, not feeling herself. Reports she was talking to her godmother today who asked if she had checked her sugars and found them to be 485 on check. Reports she has not been checking her sugars like she should. Has had diarrhea since she took the metformin and it has continued since she stopped it but less frequently.  Also notes occasional bilateral foot tingling.  No other acute concerns, no chest pain, no dyspnea. Occasional cough but no significant.  Past Medical History:  Diagnosis Date  . Abnormality of gait 05/10/2010  . BACK PAIN 11/14/2008  . CEREBROVASCULAR DISEASE 08/05/2008  . DIABETES MELLITUS, TYPE II 07/15/2008  . Diplopia 07/15/2008  . ECZEMA, ATOPIC 04/03/2009  . Guillain-Barre ([place])   . HYPERLIPIDEMIA 03/06/2009  . HYPERTENSION 07/15/2008  . Stroke ([place]) 2010   x2   . Vertebral artery stenosis     Patient Active Problem List   Diagnosis Date Noted  . DM (diabetes mellitus), type 2 with peripheral vascular complications ([place]) 89/21/1941  . ABNORMALITY OF GAIT 05/10/2010  . ECZEMA, ATOPIC 04/03/2009  . Dyslipidemia 03/06/2009  . BACK PAIN 11/14/2008  . Cerebrovascular disease 08/05/2008  . DIPLOPIA 07/15/2008  . Essential hypertension 07/15/2008    Past Surgical History:  Procedure Laterality Date  . ABDOMINAL HYSTERECTOMY    . DILATION AND CURETTAGE OF UTERUS    . FOOT SURGERY      OB History    No data available       Home  Medications    Prior to Admission medications   Medication Sig Start Date End Date Taking? Authorizing Provider  amLODipine (NORVASC) 10 MG tablet TAKE ONE TABLET BY MOUTH ONCE DAILY 11/01/16   [name], MD  atorvastatin (LIPITOR) 80 MG tablet Take 1 tablet (80 mg total) by mouth daily. 09/18/15   [name], MD  chlorthalidone (HYGROTON) 25 MG tablet TAKE ONE TABLET BY MOUTH ONCE DAILY 11/01/16   [name], MD  clopidogrel (PLAVIX) 75 MG tablet TAKE ONE TABLET BY MOUTH ONCE DAILY 11/01/16   [name], MD  FLUoxetine (PROZAC) 20 MG capsule TAKE ONE CAPSULE BY MOUTH ONCE DAILY 11/01/16   [name], MD  glucose blood [facility] VERIO) test strip USE TO CHECK BLOOD SUGAR TWICE A DAY AND PRN 07/29/15   [name], MD  insulin aspart (NOVOLOG) 100 UNIT/ML FlexPen 12 units prior to breakfast and lunch 20 units prior to dinner 11/19/16   Gareth Morgan, MD  Insulin Glargine (LANTUS SOLOSTAR) 100 UNIT/ML Solostar Pen Inject 100 Units into the skin every morning. 11/04/16   [name], MD  Insulin Pen Needle (BD PEN NEEDLE NANO U/F) 32G X 4 MM MISC Use to inject insulin every morning. 07/29/15   [name], MD  lisinopril (PRINIVIL,ZESTRIL) 40 MG tablet TAKE ONE TABLET BY MOUTH ONCE DAILY 11/01/16   [name], MD  ONE TOUCH LANCETS MISC USE TO  CHECK BLOOD SUGAR TWICE A DAY AND PRN 07/29/15   [name], MD  pantoprazole (PROTONIX) 40 MG tablet TAKE ONE TABLET BY MOUTH ONCE DAILY 03/17/16   [name], MD  pravastatin (PRAVACHOL) 40 MG tablet TAKE ONE TABLET BY MOUTH ONCE DAILY 11/01/16   [name], MD  traMADol (ULTRAM) 50 MG tablet Take 1 tablet (50 mg total) by mouth every 8 (eight) hours as needed for pain. 08/24/12   [name], MD  triamcinolone ointment (KENALOG) 0.5 % APPLY  TWICE DAILY 10/06/15   [name], MD    Family History Family History  Problem Relation Age of  Onset  . Diabetes Sister     Social History Social History  Substance Use Topics  . Smoking status: Never Smoker  . Smokeless tobacco: Never Used  . Alcohol use No     Allergies   Patient has no known allergies.   Review of Systems Review of Systems  Constitutional: Positive for fatigue. Negative for fever.  HENT: Negative for sore throat.   Eyes: Negative for visual disturbance.  Respiratory: Positive for cough (occasional). Negative for shortness of breath.   Cardiovascular: Negative for chest pain.  Gastrointestinal: Negative for abdominal pain, nausea and vomiting.  Genitourinary: Negative for difficulty urinating.  Musculoskeletal: Negative for back pain and neck pain.  Skin: Negative for rash.  Neurological: Negative for syncope and headaches.  Psychiatric/Behavioral: Positive for dysphoric mood.     Physical Exam Updated Vital Signs BP (!) 155/77 (BP Location: Left Arm)   Pulse (!) 57   Temp 98 F (36.7 C) (Oral)   Resp 14   Wt 86.2 kg (190 lb)   SpO2 98%   BMI 32.61 kg/m   Physical Exam  Constitutional: She is oriented to person, place, and time. She appears well-developed and well-nourished. No distress.  HENT:  Head: Normocephalic and atraumatic.  Eyes: Conjunctivae and EOM are normal.  Neck: Normal range of motion.  Cardiovascular: Normal rate, regular rhythm, normal heart sounds and intact distal pulses.  Exam reveals no gallop and no friction rub.   No murmur heard. Pulmonary/Chest: Effort normal and breath sounds normal. No respiratory distress. She has no wheezes. She has no rales.  Abdominal: Soft. She exhibits no distension. There is no tenderness. There is no guarding.  Musculoskeletal: She exhibits no edema or tenderness.  Neurological: She is alert and oriented to person, place, and time.  Skin: Skin is warm and dry. No rash noted. She is not diaphoretic. No erythema.  Nursing note and vitals reviewed.    ED Treatments / Results   Labs (all labs ordered are listed, but only abnormal results are displayed) Labs Reviewed  BASIC METABOLIC PANEL - Abnormal; Notable for the following:       Result Value   Glucose, Bld 331 (*)    All other components within normal limits  CBC - Abnormal; Notable for the following:    MCV 75.9 (*)    All other components within normal limits  URINALYSIS, ROUTINE W REFLEX MICROSCOPIC - Abnormal; Notable for the following:    Color, Urine STRAW (*)    Glucose, UA >=500 (*)    Leukocytes, UA TRACE (*)    Squamous Epithelial / LPF 0-5 (*)    All other components within normal limits  CBG MONITORING, ED - Abnormal; Notable for the following:    Glucose-Capillary 326 (*)    All other components within normal limits  CBG MONITORING, ED -  Abnormal; Notable for the following:    Glucose-Capillary 263 (*)    All other components within normal limits  CBG MONITORING, ED    EKG  EKG Interpretation None       Radiology No results found.  Procedures Procedures (including critical care time)  Medications Ordered in ED Medications  sodium chloride 0.9 % bolus 1,000 mL (0 mLs Intravenous Stopped 11/19/16 1625)  insulin aspart protamine- aspart (NOVOLOG MIX 70/30) injection 12 Units (12 Units Subcutaneous Given 11/19/16 1625)     Initial Impression / Assessment and Plan / ED Course  I have reviewed the triage vital signs and the nursing notes.  Pertinent labs & imaging results that were available during my care of the patient were reviewed by me and considered in my medical decision making (see chart for details).     56yo female with history of CVA, DM, hlpd, htn, prior guillan-barre, who presents with concern for hyperglycemia at home.  Patient with hyperglycemia in ED, no sign of DKA or HHS. Given IV fluids and glucose decreasing. Patient given 12u subq insulin and instructed to eat normal dinner.  Reviewed recent instructions that were recommended by Dr. Burnice Logan at visit  7/6. He recommended mealtime insulin aspart as well as increasing lantus to 100 units every morning and wrote rx for same, including 12 u prior to breakfast and lunch and 20U prior to dinner. Reviewed these recommendations he had rx. Discussed risks of hypoglycemia with patient. Gave rx for insulin aspart in case patient no longer has rx.  Also provided resources for counseling given pt grief. Patient discharged in stable condition with understanding of reasons to return.   Final Clinical Impressions(s) / ED Diagnoses   Final diagnoses:  Hyperglycemia    New Prescriptions Discharge Medication List as of 11/19/2016  4:16 PM       Gareth Morgan, MD 11/19/16 2209

## 2016-11-19 NOTE — ED Triage Notes (Signed)
Pt comes in with reports of hyperglycemia reading at 485 at home.  Just was removed from Metformin due to GI effects but does take insulin one time a day in the morning. States she does not monitor her diet or her sugars as regularly as she should.  A&O x4. Denies nausea and vomiting but has had episodes of diarrhea.

## 2016-11-21 ENCOUNTER — Telehealth: Payer: Self-pay | Admitting: Internal Medicine

## 2016-11-21 MED ORDER — INSULIN ASPART 100 UNIT/ML FLEXPEN: PEN_INJECTOR | SUBCUTANEOUS | 0 refills | Status: DC

## 2016-11-21 NOTE — Telephone Encounter (Signed)
Informed pt that we currently do not have any Novolog samples in the office. Also refilled her Novolog Rx as requested per pt.

## 2016-11-21 NOTE — Telephone Encounter (Signed)
Not sure why pharmacy states they did not get Rx. Pt was called and she will stop by the office tomorrow to pick up the Rx.

## 2016-11-21 NOTE — Telephone Encounter (Signed)
Pt calling to check to see if the Rx can be resent per her pharmacy they have not received it.

## 2016-11-21 NOTE — Telephone Encounter (Signed)
° ° ° °  Paty did you fax the below RX because they said they did not received the RX        insulin aspart (NOVOLOG) 100 UNIT/ML FlexPen

## 2016-11-21 NOTE — Telephone Encounter (Signed)
Pt would like to see if there is samples of novolog until she is able to get it.  Pt would like to have a call back today.

## 2016-11-21 NOTE — Addendum Note (Signed)
Addended by: Abelardo Diesel on: 11/21/2016 05:36 PM   Modules accepted: Orders

## 2016-12-06 LAB — HM DIABETES EYE EXAM

## 2016-12-06 NOTE — Telephone Encounter (Signed)
Pt calling to see if NOVOLOG can be resent to pharmacy they state that they did not receive it.  Pharm:  [facility]

## 2016-12-07 ENCOUNTER — Encounter: Payer: Self-pay | Admitting: Internal Medicine

## 2016-12-07 NOTE — Telephone Encounter (Signed)
Left message on voicemail to call office.  

## 2016-12-09 NOTE — Telephone Encounter (Signed)
Left message on voicemail to call office.  

## 2016-12-09 NOTE — Telephone Encounter (Signed)
Fax was confirmed at 0847 on 12/09/16

## 2016-12-09 NOTE — Telephone Encounter (Signed)
Re-faxed Rx to [facility] @ [phone]

## 2017-01-31 ENCOUNTER — Ambulatory Visit (INDEPENDENT_AMBULATORY_CARE_PROVIDER_SITE_OTHER): Payer: BLUE CROSS/BLUE SHIELD | Admitting: Endocrinology

## 2017-01-31 ENCOUNTER — Encounter: Payer: Self-pay | Admitting: Endocrinology

## 2017-01-31 ENCOUNTER — Other Ambulatory Visit: Payer: Self-pay | Admitting: Internal Medicine

## 2017-01-31 VITALS — BP 130/78 | HR 62 | Wt 189.8 lb

## 2017-01-31 DIAGNOSIS — E1151 Type 2 diabetes mellitus with diabetic peripheral angiopathy without gangrene: Secondary | ICD-10-CM

## 2017-01-31 DIAGNOSIS — Z1231 Encounter for screening mammogram for malignant neoplasm of breast: Secondary | ICD-10-CM

## 2017-01-31 LAB — POCT GLYCOSYLATED HEMOGLOBIN (HGB A1C): HEMOGLOBIN A1C: 13.1

## 2017-01-31 MED ORDER — INSULIN GLARGINE 100 UNIT/ML SOLOSTAR PEN: 120.0000 [IU] | PEN_INJECTOR | SUBCUTANEOUS | 3 refills | Status: DC

## 2017-01-31 MED ORDER — BASAGLAR KWIKPEN 100 UNIT/ML ~~LOC~~ SOPN: 120.0000 [IU] | PEN_INJECTOR | SUBCUTANEOUS | 2 refills | Status: DC

## 2017-01-31 NOTE — Patient Instructions (Addendum)
check your blood sugar twice a day.  vary the time of day when you check, between before the 3 meals, and at bedtime.  also check if you have symptoms of your blood sugar being too high or too low.  please keep a record of the readings and bring it to your next appointment here.  You can write it on any piece of paper.  please call us sooner if your blood sugar goes below 70, or if you have a lot of readings over 200. Please change the lantus to basaglar, 120 units each morning.  We are doing the patient assistance form for you.  Please come back for a follow-up appointment in 2 months.

## 2017-01-31 NOTE — Progress Notes (Signed)
Subjective:    Patient ID: [name], female    DOB: 1961/03/30, 56 y.o.   MRN: [id number]  HPI Pt returns for f/u of diabetes mellitus:  DM type: Insulin-requiring type 2.   Dx'ed: 2010.   Complications: cerebrovascular disease.   Therapy: insulin since soon after dx.  GDM: never.  DKA: never.  Severe hypoglycemia: never.  Pancreatitis: never Other: She requests to take just 1 injection per day, as she is having difficulty remembering the multiple daily injections.   Interval history: no cbg record, but states since back on the insulin, cbg's vary from 150-300's, even when she takes lantus as rx'ed.  There is no trend throughout the day.  pt states she feels well in general.  She is about to lose health insurance, due to job loss.  She sometimes misses the insulin.   Past Medical History:  Diagnosis Date  . Abnormality of gait 05/10/2010  . BACK PAIN 11/14/2008  . CEREBROVASCULAR DISEASE 08/05/2008  . DIABETES MELLITUS, TYPE II 07/15/2008  . Diplopia 07/15/2008  . ECZEMA, ATOPIC 04/03/2009  . Guillain-Barre ([place])   . HYPERLIPIDEMIA 03/06/2009  . HYPERTENSION 07/15/2008  . Stroke ([place]) 2010   x2   . Vertebral artery stenosis     Past Surgical History:  Procedure Laterality Date  . ABDOMINAL HYSTERECTOMY    . DILATION AND CURETTAGE OF UTERUS    . FOOT SURGERY      Social History   Social History  . Marital status: Single    Spouse name: N/A  . Number of children: N/A  . Years of education: N/A   Occupational History  . Not on file.   Social History Main Topics  . Smoking status: Never Smoker  . Smokeless tobacco: Never Used  . Alcohol use No  . Drug use: No  . Sexual activity: Not on file   Other Topics Concern  . Not on file   Social History Narrative  . No narrative on file    Current Outpatient Prescriptions on File Prior to Visit  Medication Sig Dispense Refill  . amLODipine (NORVASC) 10 MG tablet TAKE ONE TABLET BY MOUTH ONCE DAILY 10 tablet 35  .  atorvastatin (LIPITOR) 80 MG tablet Take 1 tablet (80 mg total) by mouth daily. 90 tablet 3  . chlorthalidone (HYGROTON) 25 MG tablet TAKE ONE TABLET BY MOUTH ONCE DAILY 10 tablet 35  . clopidogrel (PLAVIX) 75 MG tablet TAKE ONE TABLET BY MOUTH ONCE DAILY 10 tablet 35  . FLUoxetine (PROZAC) 20 MG capsule TAKE ONE CAPSULE BY MOUTH ONCE DAILY 10 capsule 35  . glucose blood (ONETOUCH VERIO) test strip USE TO CHECK BLOOD SUGAR TWICE A DAY AND PRN 100 each 6  . insulin aspart (NOVOLOG) 100 UNIT/ML FlexPen 12 units prior to breakfast and lunch 20 units prior to dinner 15 mL 0  . Insulin Pen Needle (BD PEN NEEDLE NANO U/F) 32G X 4 MM MISC Use to inject insulin every morning. 100 each 6  . lisinopril (PRINIVIL,ZESTRIL) 40 MG tablet TAKE ONE TABLET BY MOUTH ONCE DAILY 10 tablet 35  . ONE TOUCH LANCETS MISC USE TO CHECK BLOOD SUGAR TWICE A DAY AND PRN 100 each 6  . pantoprazole (PROTONIX) 40 MG tablet TAKE ONE TABLET BY MOUTH ONCE DAILY 30 tablet 5  . pravastatin (PRAVACHOL) 40 MG tablet TAKE ONE TABLET BY MOUTH ONCE DAILY 30 tablet 11  . traMADol (ULTRAM) 50 MG tablet Take 1 tablet (50 mg total) by mouth every 8 (  eight) hours as needed for pain. 30 tablet 0  . triamcinolone ointment (KENALOG) 0.5 % APPLY  TWICE DAILY 15 g 1   No current facility-administered medications on file prior to visit.     No Known Allergies  Family History  Problem Relation Age of Onset  . Diabetes Sister     BP 130/78   Pulse 62   Wt 189 lb 12.8 oz (86.1 kg)   SpO2 98%   BMI 32.58 kg/m    Review of Systems She denies hypoglycemia.     Objective:   Physical Exam VITAL SIGNS:  See vs page GENERAL: no distress Pulses: foot pulses are intact bilaterally.   MSK: no deformity of the feet or ankles.  CV: no edema of the legs or ankles.  Skin:  no ulcer on the feet or ankles.  normal color and temp on the feet and ankles.  Neuro: sensation is intact to touch on the feet and ankles, but decreased from normal.    Lab Results  Component Value Date   HGBA1C 13.1 01/31/2017       Assessment & Plan:  Insulin-requiring type 2 DM, with CVD: worse.  Noncompliance with cbg recording and insulin.  She is not a candidate for multiple daily injections.  Patient Instructions  check your blood sugar twice a day.  vary the time of day when you check, between before the 3 meals, and at bedtime.  also check if you have symptoms of your blood sugar being too high or too low.  please keep a record of the readings and bring it to your next appointment here.  You can write it on any piece of paper.  please call us sooner if your blood sugar goes below 70, or if you have a lot of readings over 200. Please change the lantus to basaglar, 120 units each morning.  We are doing the patient assistance form for you.  Please come back for a follow-up appointment in 2 months.

## 2017-02-02 ENCOUNTER — Telehealth: Payer: Self-pay | Admitting: Internal Medicine

## 2017-02-02 NOTE — Telephone Encounter (Signed)
Patient dropped off [employer] Patient Assistance Program Application. Forms are in Dr's folder

## 2017-02-07 NOTE — Telephone Encounter (Signed)
Please note

## 2017-02-08 ENCOUNTER — Ambulatory Visit
Admission: RE | Admit: 2017-02-08 | Discharge: 2017-02-08 | Disposition: A | Discharge status: Home / Self Care | Payer: BLUE CROSS/BLUE SHIELD | Source: Ambulatory Visit | Attending: Internal Medicine | Admitting: Internal Medicine

## 2017-02-08 DIAGNOSIS — Z1231 Encounter for screening mammogram for malignant neoplasm of breast: Secondary | ICD-10-CM

## 2017-02-13 ENCOUNTER — Telehealth: Payer: Self-pay

## 2017-02-13 NOTE — Telephone Encounter (Signed)
Called patient about [employer] patient assistance form. It has been filled out & faxed in.

## 2017-02-23 ENCOUNTER — Other Ambulatory Visit: Payer: Self-pay

## 2017-02-23 ENCOUNTER — Telehealth: Payer: Self-pay

## 2017-02-23 MED ORDER — INSULIN GLARGINE 100 UNIT/ML SOLOSTAR PEN: PEN_INJECTOR | SUBCUTANEOUS | 11 refills | Status: DC

## 2017-02-23 NOTE — Telephone Encounter (Signed)
Called patient to ask if she had heard from [facility] yet. Patient stated she had not but had insurance with [employer] [place] until Nov. I received a PA form for basaglar so for now I sent it in as lantus.

## 2017-03-03 ENCOUNTER — Telehealth: Payer: Self-pay

## 2017-03-03 NOTE — Telephone Encounter (Signed)
I called patient & stated that when I called [name] about patient assistance that what was needed was a letter from her insurance stating that she had lost coverage. When she receives any type of letter stating that I asked her to bring it to the office so we can send it to [place] with our letter to appeal her denial for assistance. Patient is also concerned about other medications & I told her to wait until she gets a termination letter from insurance & we could look into other patient assistance options.

## 2017-03-10 ENCOUNTER — Ambulatory Visit: Payer: BLUE CROSS/BLUE SHIELD | Admitting: Podiatry

## 2017-03-10 ENCOUNTER — Ambulatory Visit (INDEPENDENT_AMBULATORY_CARE_PROVIDER_SITE_OTHER): Payer: BLUE CROSS/BLUE SHIELD

## 2017-03-10 ENCOUNTER — Encounter: Payer: Self-pay | Admitting: Podiatry

## 2017-03-10 VITALS — BP 161/94 | HR 77 | Resp 16

## 2017-03-10 DIAGNOSIS — M2042 Other hammer toe(s) (acquired), left foot: Secondary | ICD-10-CM

## 2017-03-10 DIAGNOSIS — L84 Corns and callosities: Secondary | ICD-10-CM | POA: Diagnosis not present

## 2017-03-10 DIAGNOSIS — D689 Coagulation defect, unspecified: Secondary | ICD-10-CM

## 2017-03-10 DIAGNOSIS — M2041 Other hammer toe(s) (acquired), right foot: Secondary | ICD-10-CM | POA: Diagnosis not present

## 2017-03-10 DIAGNOSIS — M779 Enthesopathy, unspecified: Secondary | ICD-10-CM

## 2017-03-10 MED ORDER — TRIAMCINOLONE ACETONIDE 10 MG/ML IJ SUSP
10.0000 mg | Freq: Once | INTRAMUSCULAR | Status: AC
  Administered 2017-03-10: 10 mg

## 2017-03-10 NOTE — Progress Notes (Signed)
   Subjective:    Patient ID: [name], female    DOB: [birth year], 56 y.o.   MRN: [id number]  HPI Chief Complaint  Patient presents with  . Foot Pain    5th MPJ bilateral - tender walking x 1 month, sharp sensations, callused areas, some numbness, states glucose been running high  . Diabetes    Last A1c 9 months ago was 12.2      Review of Systems  All other systems reviewed and are negative.      Objective:   Physical Exam        Assessment & Plan:

## 2017-03-13 NOTE — Progress Notes (Signed)
Subjective:    Patient ID: [name], female   DOB: 56 y.o.   MRN: [id number]   HPI patient presents with exquisite discomfort around the fifth MPJ of both feet with fluid buildup keratotic tissue and inability to walk comfortably. Patient is a diabetic and has a current high A1c but is working on this    Review of Systems  All other systems reviewed and are negative.       Objective:  Physical Exam  Constitutional: She appears well-developed and well-nourished.  Cardiovascular: Intact distal pulses.  Pulmonary/Chest: Effort normal.  Musculoskeletal: Normal range of motion.  Neurological: She is alert.  Skin: Skin is warm.  Nursing note and vitals reviewed.  vascular status was intact with diminishment of neurological sensation bilateral with inflamed keratotic lesions under the fifth metatarsal head bilateral that are painful when pressed. Patient has slight fluid buildup around these areas and they are difficult for her to walk on and she would like to be active but is not been able to due to the pain. 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 Patient will be seen back and ultimately may require other treatments depending on the response  X-rays indicate that there is inflammatory changes around the fifth MPJ bilateral but no indications of other bone pathology

## 2017-03-16 ENCOUNTER — Telehealth: Payer: Self-pay | Admitting: Endocrinology

## 2017-03-16 ENCOUNTER — Encounter: Payer: Self-pay | Admitting: Internal Medicine

## 2017-03-16 ENCOUNTER — Ambulatory Visit: Payer: BLUE CROSS/BLUE SHIELD | Admitting: Internal Medicine

## 2017-03-16 VITALS — BP 168/96 | HR 68 | Temp 98.4°F | Wt 198.0 lb

## 2017-03-16 DIAGNOSIS — Z23 Encounter for immunization: Secondary | ICD-10-CM | POA: Diagnosis not present

## 2017-03-16 DIAGNOSIS — E785 Hyperlipidemia, unspecified: Secondary | ICD-10-CM

## 2017-03-16 DIAGNOSIS — I10 Essential (primary) hypertension: Secondary | ICD-10-CM | POA: Diagnosis not present

## 2017-03-16 DIAGNOSIS — E1151 Type 2 diabetes mellitus with diabetic peripheral angiopathy without gangrene: Secondary | ICD-10-CM

## 2017-03-16 MED ORDER — INSULIN GLARGINE 100 UNIT/ML SOLOSTAR PEN: PEN_INJECTOR | SUBCUTANEOUS | 11 refills | Status: DC

## 2017-03-16 NOTE — Telephone Encounter (Signed)
Called patient & stated that we needed letter from insurance stating that coverage was dropped. Then we could resend that with the letter I had typed out to [employer].

## 2017-03-16 NOTE — Patient Instructions (Signed)
Limit your sodium (Salt) intake  Please take all your medications faithfully as prescribed   Please check your hemoglobin A1c every 3 months    It is important that you exercise regularly, at least 20 minutes 3 to 4 times per week.  If you develop chest pain or shortness of breath seek  medical attention.  You need to lose weight.  Consider a lower calorie diet and regular exercise.

## 2017-03-16 NOTE — Progress Notes (Signed)
Subjective:    Patient ID: [name], female    DOB: Nov 30, 1960, 56 y.o.   MRN: [id number]  HPI  Lab Results  Component Value Date   HGBA1C 13.1 01/31/2017    BP Readings from Last 3 Encounters:  03/16/17 (!) 168/96  03/10/17 (!) 161/94  01/31/17 59/17   56 year old patient who has treated hypertension.  She has been out of her medications for 2 days and blood pressure elevated today. She has diabetes which has not been well controlled.  She states that later this month she will run out of health benefits due to termination of her job. She generally feels well.  She admits to dietary indiscretion consuming Halloween candy with some associated weight gain. No hypoglycemic symptoms.  Did have a diabetic eye examination performed 3 months ago  Past Medical History:  Diagnosis Date  . Abnormality of gait 05/10/2010  . BACK PAIN 11/14/2008  . CEREBROVASCULAR DISEASE 08/05/2008  . DIABETES MELLITUS, TYPE II 07/15/2008  . Diplopia 07/15/2008  . ECZEMA, ATOPIC 04/03/2009  . Guillain-Barre ([place])   . HYPERLIPIDEMIA 03/06/2009  . HYPERTENSION 07/15/2008  . Stroke ([place]) 2010   x2   . Vertebral artery stenosis      Social History   Socioeconomic History  . Marital status: Single    Spouse name: Not on file  . Number of children: Not on file  . Years of education: Not on file  . Highest education level: Not on file  Social Needs  . Financial resource strain: Not on file  . Food insecurity - worry: Not on file  . Food insecurity - inability: Not on file  . Transportation needs - medical: Not on file  . Transportation needs - non-medical: Not on file  Occupational History  . Not on file  Tobacco Use  . Smoking status: Never Smoker  . Smokeless tobacco: Never Used  Substance and Sexual Activity  . Alcohol use: No  . Drug use: No  . Sexual activity: Not on file  Other Topics Concern  . Not on file  Social History Narrative  . Not on file    Past Surgical History:    Procedure Laterality Date  . ABDOMINAL HYSTERECTOMY    . DILATION AND CURETTAGE OF UTERUS    . FOOT SURGERY      Family History  Problem Relation Age of Onset  . Diabetes Sister   . Breast cancer Neg Hx     No Known Allergies  Current Outpatient Medications on File Prior to Visit  Medication Sig Dispense Refill  . amLODipine (NORVASC) 10 MG tablet TAKE ONE TABLET BY MOUTH ONCE DAILY 10 tablet 35  . atorvastatin (LIPITOR) 80 MG tablet Take 1 tablet (80 mg total) by mouth daily. 90 tablet 3  . chlorthalidone (HYGROTON) 25 MG tablet TAKE ONE TABLET BY MOUTH ONCE DAILY 10 tablet 35  . clopidogrel (PLAVIX) 75 MG tablet TAKE ONE TABLET BY MOUTH ONCE DAILY 10 tablet 35  . FLUoxetine (PROZAC) 20 MG capsule TAKE ONE CAPSULE BY MOUTH ONCE DAILY 10 capsule 35  . glucose blood (ONETOUCH VERIO) test strip USE TO CHECK BLOOD SUGAR TWICE A DAY AND PRN 100 each 6  . insulin aspart (NOVOLOG) 100 UNIT/ML FlexPen 12 units prior to breakfast and lunch 20 units prior to dinner 15 mL 0  . Insulin Glargine (BASAGLAR KWIKPEN) 100 UNIT/ML SOPN Inject 1.2 mLs (120 Units total) into the skin every morning. 60 pen 2  . Insulin Glargine (LANTUS) 100  UNIT/ML Solostar Pen Inject 120 units into the skin every morning subcutaneously. 15 mL 11  . Insulin Pen Needle (BD PEN NEEDLE NANO U/F) 32G X 4 MM MISC Use to inject insulin every morning. 100 each 6  . lisinopril (PRINIVIL,ZESTRIL) 40 MG tablet TAKE ONE TABLET BY MOUTH ONCE DAILY 10 tablet 35  . ONE TOUCH LANCETS MISC USE TO CHECK BLOOD SUGAR TWICE A DAY AND PRN 100 each 6  . pantoprazole (PROTONIX) 40 MG tablet TAKE ONE TABLET BY MOUTH ONCE DAILY 30 tablet 5  . pravastatin (PRAVACHOL) 40 MG tablet TAKE ONE TABLET BY MOUTH ONCE DAILY 30 tablet 11  . traMADol (ULTRAM) 50 MG tablet Take 1 tablet (50 mg total) by mouth every 8 (eight) hours as needed for pain. 30 tablet 0  . triamcinolone ointment (KENALOG) 0.5 % APPLY  TWICE DAILY 15 g 1   No current  facility-administered medications on file prior to visit.     BP (!) 168/96 (BP Location: Left Arm, Patient Position: Sitting, Cuff Size: Normal)   Pulse 68   Temp 98.4 F (36.9 C) (Oral)   Wt 198 lb (89.8 kg)   SpO2 97%   BMI 33.99 kg/m     Review of Systems  Constitutional: Negative.   HENT: Negative for congestion, dental problem, hearing loss, rhinorrhea, sinus pressure, sore throat and tinnitus.   Eyes: Negative for pain, discharge and visual disturbance.  Respiratory: Negative for cough and shortness of breath.   Cardiovascular: Negative for chest pain, palpitations and leg swelling.  Gastrointestinal: Negative for abdominal distention, abdominal pain, blood in stool, constipation, diarrhea, nausea and vomiting.  Genitourinary: Negative for difficulty urinating, dysuria, flank pain, frequency, hematuria, pelvic pain, urgency, vaginal bleeding, vaginal discharge and vaginal pain.  Musculoskeletal: Negative for arthralgias, gait problem and joint swelling.  Skin: Negative for rash.  Neurological: Negative for dizziness, syncope, speech difficulty, weakness, numbness and headaches.  Hematological: Negative for adenopathy.  Psychiatric/Behavioral: Negative for agitation, behavioral problems and dysphoric mood. The patient is not nervous/anxious.        Objective:   Physical Exam  Constitutional: She is oriented to person, place, and time. She appears well-developed and well-nourished.  Blood pressure 160/92  HENT:  Head: Normocephalic.  Right Ear: External ear normal.  Left Ear: External ear normal.  Mouth/Throat: Oropharynx is clear and moist.  Eyes: Conjunctivae and EOM are normal. Pupils are equal, round, and reactive to light.  Neck: Normal range of motion. Neck supple. No thyromegaly present.  Cardiovascular: Normal rate, regular rhythm, normal heart sounds and intact distal pulses.  Pulmonary/Chest: Effort normal and breath sounds normal.  Abdominal: Soft. Bowel  sounds are normal. She exhibits no mass. There is no tenderness.  Musculoskeletal: Normal range of motion.  Lymphadenopathy:    She has no cervical adenopathy.  Neurological: She is alert and oriented to person, place, and time.  Skin: Skin is warm and dry. No rash noted.  Psychiatric: She has a normal mood and affect. Her behavior is normal.          Assessment & Plan:   Hypertension poor control.  Patient will resume triple therapy. Diabetes poor control.  Samples of Lantus dispensed.  Follow-up endocrinology Dyslipidemia.  Continue statin therapy  Endocrine follow-up as scheduled return here in 3 months for follow-up  Flu vaccine administered  Nyoka Cowden

## 2017-03-16 NOTE — Telephone Encounter (Signed)
patient  [employer] patient assistance form was denied, please advise

## 2017-03-17 ENCOUNTER — Ambulatory Visit: Payer: BLUE CROSS/BLUE SHIELD

## 2017-03-22 ENCOUNTER — Telehealth: Payer: Self-pay | Admitting: Family Medicine

## 2017-03-22 NOTE — Telephone Encounter (Signed)
Copied from [place] [id number]. Topic: Inquiry >> Mar 22, 2017  1:01 PM Scherrie Gerlach wrote: Reason for CRM: pt states she was in last Thursday and advised she needs Dr Raliegh Ip to sign paperwork for assistance program to get her insulin.  Pt states nurse could not find, so called the company [employer] in [place] who was to fax to our office with the paperwork for Dr Raliegh Ip to sign. Pt wants to know if we have received. Because pt needs to come by our office and fill her part out too

## 2017-03-24 ENCOUNTER — Other Ambulatory Visit: Payer: Self-pay | Admitting: Internal Medicine

## 2017-03-28 NOTE — Telephone Encounter (Signed)
Called pt to inform that we have not received any forms for her assistance program.   Pt stated to void her request and she will go threw another assistance program and bring the paperwork in person to the office.

## 2017-04-04 ENCOUNTER — Telehealth: Payer: Self-pay | Admitting: Internal Medicine

## 2017-04-04 NOTE — Telephone Encounter (Signed)
Pt dropped off [employer] Patient assistance application.  Put in Dr's folder and pt would like to have it faxed to [phone] upon completion.

## 2017-04-12 NOTE — Telephone Encounter (Signed)
Insulin patient uses is lantus

## 2017-04-12 NOTE — Telephone Encounter (Signed)
Dr needs to know what Insulin pt need paperwork filled out for. Pt is unable to say which medication she needs, will call office back to inform.

## 2017-04-18 ENCOUNTER — Telehealth: Payer: Self-pay | Admitting: Internal Medicine

## 2017-04-18 NOTE — Telephone Encounter (Signed)
Called pt and informed her that her Insulin Lantus solorstar pen came in. Pt verbalized understanding and will stop by the office tomorrow to pick up her medication.

## 2017-04-19 NOTE — Telephone Encounter (Signed)
Patient picked up Lantus

## 2017-04-21 ENCOUNTER — Telehealth: Payer: Self-pay | Admitting: Internal Medicine

## 2017-04-21 NOTE — Telephone Encounter (Signed)
Copied from [place] [id number]. Topic: Quick Communication - Rx Refill/Question >> Apr 21, 2017  4:21 PM Oliver Pila B wrote: Pt called to ask about samples or medication alternatives b/c the pt has lost her insurance and would like some advice on what to do, contact pt to advise She says she is out of:  Hygroten Norvasc Lisinopril Provastatin prozac

## 2017-04-27 NOTE — Telephone Encounter (Signed)
Spoke with pt and asked her to do some research and price check her local pharmacies for generic price on her meds. She will call back informing us which pharmacy offers her the best price.  We do not have any samples in the office of her medications.

## 2017-05-04 ENCOUNTER — Telehealth: Payer: Self-pay | Admitting: Internal Medicine

## 2017-05-04 NOTE — Telephone Encounter (Signed)
Copied from [place] [id number]. Topic: Quick Communication - See Telephone Encounter >> May 04, 2017  4:57 PM Cleaster Corin, NT wrote: CRM for notification. See Telephone encounter for:   05/04/17. Pt. Calling to see if Dr. Burnice Logan nurse could give her a call about low cost med. Pt. Can be reached at [phone]

## 2017-05-05 ENCOUNTER — Telehealth: Payer: Self-pay | Admitting: Internal Medicine

## 2017-05-05 ENCOUNTER — Other Ambulatory Visit: Payer: Self-pay | Admitting: Internal Medicine

## 2017-05-05 MED ORDER — PANTOPRAZOLE SODIUM 40 MG PO TBEC: 40.0000 mg | DELAYED_RELEASE_TABLET | Freq: Every day | ORAL | 3 refills | Status: DC

## 2017-05-05 MED ORDER — ATORVASTATIN CALCIUM 80 MG PO TABS: 80.0000 mg | ORAL_TABLET | Freq: Every day | ORAL | 3 refills | Status: DC

## 2017-05-05 MED ORDER — LISINOPRIL 40 MG PO TABS: 40.0000 mg | ORAL_TABLET | Freq: Every day | ORAL | 3 refills | Status: DC

## 2017-05-05 MED ORDER — PRAVASTATIN SODIUM 40 MG PO TABS: 40.0000 mg | ORAL_TABLET | Freq: Every day | ORAL | 3 refills | Status: DC

## 2017-05-05 MED ORDER — FLUOXETINE HCL 20 MG PO CAPS: 20.0000 mg | ORAL_CAPSULE | Freq: Every day | ORAL | 3 refills | Status: DC

## 2017-05-05 MED ORDER — AMLODIPINE BESYLATE 10 MG PO TABS: 10.0000 mg | ORAL_TABLET | Freq: Every day | ORAL | 3 refills | Status: DC

## 2017-05-05 MED ORDER — CHLORTHALIDONE 25 MG PO TABS: 25.0000 mg | ORAL_TABLET | Freq: Every day | ORAL | 3 refills | Status: DC

## 2017-05-05 MED ORDER — CLOPIDOGREL BISULFATE 75 MG PO TABS: 75.0000 mg | ORAL_TABLET | Freq: Every day | ORAL | 3 refills | Status: DC

## 2017-05-05 NOTE — Telephone Encounter (Signed)
Pt's med's on the list she brought this morning were already addressed in another telephone encounter.

## 2017-05-05 NOTE — Telephone Encounter (Signed)
Pt came in the office to bring in information about the medication that she needs to have refilled  1.  Amlodipine 10 MG  2.  Atorvastatin 80 MG  3.  Chlorthalidone 25 MG  4.  Clopidogrel 75 MG  5   Fluoxetine 20 MG  6   Lisinopril 40 MG  7.  Pantoprazole 40 MG  8.  Pravastatin  40 MG  And she would like for it to go to the [facility] on [address].  I have put the information that the pt left for the provider in his folder to go over and she would like to have the ones that will be $4.00 for (30 day supply) and $10.00 (90 day supply)

## 2017-05-05 NOTE — Telephone Encounter (Signed)
I tried to call pt to ask about low cost medication, there was no answer.

## 2017-05-05 NOTE — Telephone Encounter (Signed)
Copied from [place] [id number]. Topic: Quick Communication - See Telephone Encounter >> May 05, 2017 11:09 AM Eulogio Bear J wrote: Pt called - she has generic list from [facility] to see if she can substitute her medication for.  Pt will be bringing the list to the office so you can find a replacement.

## 2017-05-05 NOTE — Telephone Encounter (Signed)
All the below medications were e-scribed to [facility].

## 2017-05-09 ENCOUNTER — Telehealth: Payer: Self-pay | Admitting: Internal Medicine

## 2017-05-09 NOTE — Telephone Encounter (Signed)
Pt asking if she could be prescribed a different medication besides Lipitor and Protonix because she can't afford the medications.

## 2017-05-09 NOTE — Telephone Encounter (Signed)
Copied from [place]. Topic: Quick Communication - See Telephone Encounter >> May 09, 2017  5:15 PM Corie Chiquito, [place] wrote: CRM for notification. See Telephone encounter for: Patient calling because she would like to know if there is a different medication that she could have besides the Lipitor and Protonix because they both coast to much money and she can't afford them. If someone could give her a call back about this at [phone]  05/09/17.

## 2017-05-11 MED ORDER — ATORVASTATIN CALCIUM 20 MG PO TABS: 20.0000 mg | ORAL_TABLET | Freq: Every day | ORAL | 3 refills | Status: DC

## 2017-05-11 MED ORDER — OMEPRAZOLE 20 MG PO CPDR: 20.0000 mg | DELAYED_RELEASE_CAPSULE | Freq: Every day | ORAL | 0 refills | Status: DC

## 2017-05-11 NOTE — Telephone Encounter (Signed)
New prescriptions sent to the pharmacy.  Left a message informing pt that prescriptions have been sent.  Call back if needed.

## 2017-05-11 NOTE — Telephone Encounter (Signed)
Spoke to the pt.  She currently does not have insurance.  She needs medications that are covered under the $4 plan at [facility].  In place of Lipitor you could prescribe atorvastatin, fenofibrate, gemfibrozil or simvastatin.  In place of Protonix you could prescribe metoclopramide or omeprazole.  All listed are on the $4 plan.  Please advise.

## 2017-05-11 NOTE — Telephone Encounter (Signed)
Okay to switch to atorvastatin 20 mg #90 one daily Okay to switch to omeprazole 20 mg #90  One daily

## 2017-06-05 ENCOUNTER — Telehealth: Payer: Self-pay | Admitting: Internal Medicine

## 2017-06-05 NOTE — Telephone Encounter (Signed)
Copied from [place] [phone]. Topic: General - Other >> Jun 05, 2017 12:15 PM Lolita Rieger, RMA wrote: Reason for CRM: Pt called and wanted to know what she can take in place of her chlorthalidone 25mg  that would be on the $4 list since she does not have insurance she also wanted to know what she can take in place of her lisinopril 40 mg she stated that they only have the 30,20,10,5, and 2.5 dosages Please call pt to advise

## 2017-06-05 NOTE — Telephone Encounter (Signed)
Sir please see below message, please advise.

## 2017-06-06 NOTE — Telephone Encounter (Signed)
Patty let's have Dr. Raliegh Ip decide this when he gets back next week

## 2017-06-07 ENCOUNTER — Telehealth: Payer: Self-pay | Admitting: Internal Medicine

## 2017-06-07 NOTE — Telephone Encounter (Unsigned)
Copied from [place]. Topic: Quick Communication - Rx Refill/Question >> Jun 07, 2017  2:38 PM Hewitt Shorts wrote: Medication: clopidozrel 75mg  lisinopril hctz 20-25mg    Has the patient contacted their pharmacy? no- not on the patient medicaiton list at the pharmacy per the pharmacy    Preferred Pharmacy (with phone number or street name) [facility]    [phone]   Agent: Please be advised that RX refills may take up to 3 business days. We ask that you follow-up with your pharmacy.

## 2017-06-07 NOTE — Telephone Encounter (Signed)
Contacted pt regarding prescription request for lisinopril and clopidogrel; she would like to let the physician know that she would like to have her prescription changed from lisinopril 40 mg $63monthlyto lisinopril 30 mg or lisinoprl hctz 20-25 because they are $4 monthly; will route to [facility] to review by provider. Please call pt back with final disposition.

## 2017-06-07 NOTE — Telephone Encounter (Signed)
Contacted pharmacy . And spoke with Judson Roch, regarding refill request for clopidogrel 75 mg and lisinopril 40 mg; she states that they are ready for pick up; CRM # [phone] is requesting lisonpril hctz 20-25 is not on pt medication list; will contact pt to make her aware.

## 2017-06-07 NOTE — Telephone Encounter (Signed)
Ok to change Rx? Please see below message.

## 2017-06-11 NOTE — Telephone Encounter (Signed)
Okay to discontinue chlorthalidone and substitute hydrochlorothiazide 25 mg daily #9 0 1 daily Okay to decrease lisinopril to 30 mg number 90 tablets 1 daily

## 2017-06-11 NOTE — Telephone Encounter (Signed)
Okay to change medical regimen to lisinopril 20-hydrochlorothiazide 25 number 90 tablets 1 tablet daily

## 2017-06-12 ENCOUNTER — Telehealth: Payer: Self-pay | Admitting: Internal Medicine

## 2017-06-12 NOTE — Telephone Encounter (Signed)
Copied from [place]. Topic: Quick Communication - Rx Refill/Question >> Jun 09, 2017  5:19 PM Percell Belt A wrote: Pt called in about the status of this med. She is totally out

## 2017-06-12 NOTE — Telephone Encounter (Signed)
Please call in a new prescription for lisinopril 20-hydrochlorothiazide 25 #90 1 tablet daily

## 2017-06-12 NOTE — Telephone Encounter (Signed)
See TE dated 06/07/17.

## 2017-06-12 NOTE — Telephone Encounter (Signed)
Copied from [place] [phone]. Topic: Quick Communication - See Telephone Encounter >> Jun 12, 2017  3:49 PM Cleaster Corin, NT wrote: CRM for notification. See Telephone encounter for:   06/12/17. Pt. Calling to check on med. Lisinopril. Pt. Can be reached ar [place], [address] [place]. [place] [address] Phone: [phone] Fax: [phone]

## 2017-06-13 ENCOUNTER — Other Ambulatory Visit: Payer: Self-pay | Admitting: Internal Medicine

## 2017-06-13 MED ORDER — LISINOPRIL-HYDROCHLOROTHIAZIDE 20-25 MG PO TABS: 1.0000 | ORAL_TABLET | Freq: Every day | ORAL | 3 refills | Status: DC

## 2017-06-13 NOTE — Telephone Encounter (Signed)
Already addressed in other telephone note.

## 2017-06-13 NOTE — Telephone Encounter (Signed)
E-scribed new Rx for  lisinopril 20-hydrochlorothiazide 25 #90 1 tablet daily. Called pt and left a detailed voice message to inform.

## 2017-08-01 ENCOUNTER — Encounter (HOSPITAL_COMMUNITY): Payer: Self-pay | Admitting: Student

## 2017-08-01 ENCOUNTER — Other Ambulatory Visit: Payer: Self-pay

## 2017-08-01 ENCOUNTER — Emergency Department (HOSPITAL_COMMUNITY)
Admission: EM | Admit: 2017-08-01 | Discharge: 2017-08-01 | Disposition: A | Discharge status: Home / Self Care | Payer: BLUE CROSS/BLUE SHIELD | Attending: Emergency Medicine | Admitting: Emergency Medicine

## 2017-08-01 ENCOUNTER — Ambulatory Visit: Payer: Self-pay | Admitting: *Deleted

## 2017-08-01 DIAGNOSIS — Z79899 Other long term (current) drug therapy: Secondary | ICD-10-CM | POA: Insufficient documentation

## 2017-08-01 DIAGNOSIS — E119 Type 2 diabetes mellitus without complications: Secondary | ICD-10-CM | POA: Insufficient documentation

## 2017-08-01 DIAGNOSIS — Z794 Long term (current) use of insulin: Secondary | ICD-10-CM | POA: Insufficient documentation

## 2017-08-01 DIAGNOSIS — L84 Corns and callosities: Secondary | ICD-10-CM

## 2017-08-01 DIAGNOSIS — Z7902 Long term (current) use of antithrombotics/antiplatelets: Secondary | ICD-10-CM | POA: Insufficient documentation

## 2017-08-01 DIAGNOSIS — I10 Essential (primary) hypertension: Secondary | ICD-10-CM | POA: Insufficient documentation

## 2017-08-01 DIAGNOSIS — Z8673 Personal history of transient ischemic attack (TIA), and cerebral infarction without residual deficits: Secondary | ICD-10-CM | POA: Insufficient documentation

## 2017-08-01 LAB — CBG MONITORING, ED: Glucose-Capillary: 123 mg/dL — ABNORMAL HIGH (ref 65–99)

## 2017-08-01 NOTE — ED Provider Notes (Signed)
[facility] DEPT Provider Note   CSN: [id number] Arrival date & time: 08/01/17  1733     History   Chief Complaint Chief Complaint  Patient presents with  . Callouses    HPI [name] is a 57 y.o. female.  57 year old female presents with calluses on both feet localized to the base of the fifth digit.  She had been seen by her podiatrist prior for similar symptoms.  Denies any fever or chills.  Redness to the area.  No severe pain.  Because of calluses had turned to different, she is concerned about possible infection.  Denies any new numbness or tingling.     Past Medical History:  Diagnosis Date  . Abnormality of gait 05/10/2010  . BACK PAIN 11/14/2008  . CEREBROVASCULAR DISEASE 08/05/2008  . DIABETES MELLITUS, TYPE II 07/15/2008  . Diplopia 07/15/2008  . ECZEMA, ATOPIC 04/03/2009  . Guillain-Barre ([place])   . HYPERLIPIDEMIA 03/06/2009  . HYPERTENSION 07/15/2008  . Stroke ([place]) 2010   x2   . Vertebral artery stenosis     Patient Active Problem List   Diagnosis Date Noted  . DM (diabetes mellitus), type 2 with peripheral vascular complications ([place]) 85/27/7824  . ABNORMALITY OF GAIT 05/10/2010  . ECZEMA, ATOPIC 04/03/2009  . Dyslipidemia 03/06/2009  . BACK PAIN 11/14/2008  . Cerebrovascular disease 08/05/2008  . DIPLOPIA 07/15/2008  . Essential hypertension 07/15/2008    Past Surgical History:  Procedure Laterality Date  . ABDOMINAL HYSTERECTOMY    . DILATION AND CURETTAGE OF UTERUS    . FOOT SURGERY       OB History   None      Home Medications    Prior to Admission medications   Medication Sig Start Date End Date Taking? Authorizing Provider  amLODipine (NORVASC) 10 MG tablet Take 1 tablet (10 mg total) by mouth daily. 05/05/17   [name], MD  atorvastatin (LIPITOR) 20 MG tablet Take 1 tablet (20 mg total) by mouth daily. 05/11/17   [name], MD  chlorthalidone (HYGROTON) 25 MG tablet Take 1 tablet  (25 mg total) by mouth daily. 05/05/17   [name], MD  clopidogrel (PLAVIX) 75 MG tablet Take 1 tablet (75 mg total) by mouth daily. 05/05/17   [name], MD  FLUoxetine (PROZAC) 20 MG capsule Take 1 capsule (20 mg total) by mouth daily. 05/05/17   [name], MD  glucose blood [facility] VERIO) test strip USE TO CHECK BLOOD SUGAR TWICE A DAY AND PRN 07/29/15   [name], MD  insulin aspart (NOVOLOG) 100 UNIT/ML FlexPen 12 units prior to breakfast and lunch 20 units prior to dinner 11/21/16   [name], MD  Insulin Glargine (BASAGLAR KWIKPEN) 100 UNIT/ML SOPN Inject 1.2 mLs (120 Units total) into the skin every morning. 01/31/17   [name], MD  Insulin Glargine (LANTUS) 100 UNIT/ML Solostar Pen Inject 120 units into the skin every morning subcutaneously. 03/16/17   [name], MD  Insulin Pen Needle (BD PEN NEEDLE NANO U/F) 32G X 4 MM MISC Use to inject insulin every morning. 07/29/15   [name], MD  lisinopril (PRINIVIL,ZESTRIL) 40 MG tablet Take 1 tablet (40 mg total) by mouth daily. 05/05/17   [name], MD  lisinopril-hydrochlorothiazide (PRINZIDE,ZESTORETIC) 20-25 MG tablet Take 1 tablet by mouth daily. 06/13/17   [name], MD  omeprazole (PRILOSEC) 20 MG capsule Take 1 capsule (20 mg total) by mouth daily. 05/11/17  [name], MD  ONE TOUCH LANCETS MISC USE TO CHECK BLOOD SUGAR TWICE A DAY AND PRN 07/29/15   [name], MD  pravastatin (PRAVACHOL) 40 MG tablet Take 1 tablet (40 mg total) by mouth daily. 05/05/17   [name], MD  traMADol (ULTRAM) 50 MG tablet Take 1 tablet (50 mg total) by mouth every 8 (eight) hours as needed for pain. 08/24/12   [name], MD  triamcinolone ointment (KENALOG) 0.5 % APPLY  TWICE DAILY 10/06/15   [name], MD    Family History Family History  Problem Relation Age of Onset  . Diabetes Sister   . Breast cancer Neg Hx       Social History Social History   Tobacco Use  . Smoking status: Never Smoker  . Smokeless tobacco: Never Used  Substance Use Topics  . Alcohol use: No  . Drug use: No     Allergies   Patient has no known allergies.   Review of Systems Review of Systems  All other systems reviewed and are negative.    Physical Exam Updated Vital Signs BP (!) 144/90   Pulse 75   Temp 97.6 F (36.4 C)   Resp 18   Ht 1.638 m (5' 4.5")   Wt 89.8 kg (198 lb)   SpO2 99%   BMI 33.46 kg/m   Physical Exam  Constitutional: She is oriented to person, place, and time. She appears well-developed and well-nourished.  Non-toxic appearance. No distress.  HENT:  Head: Normocephalic and atraumatic.  Eyes: Pupils are equal, round, and reactive to light. Conjunctivae, EOM and lids are normal.  Neck: Normal range of motion. Neck supple. No tracheal deviation present. No thyroid mass present.  Cardiovascular: Normal rate, regular rhythm and normal heart sounds. Exam reveals no gallop.  No murmur heard. Pulmonary/Chest: Effort normal and breath sounds normal. No stridor. No respiratory distress. She has no decreased breath sounds. She has no wheezes. She has no rhonchi. She has no rales.  Abdominal: Soft. Normal appearance and bowel sounds are normal. She exhibits no distension. There is no tenderness. There is no rebound and no CVA tenderness.  Musculoskeletal: Normal range of motion. She exhibits no edema or tenderness.       Feet:  Neurological: She is alert and oriented to person, place, and time. She has normal strength. No cranial nerve deficit or sensory deficit. GCS eye subscore is 4. GCS verbal subscore is 5. GCS motor subscore is 6.  Skin: Skin is warm and dry. No abrasion and no rash noted.  Psychiatric: She has a normal mood and affect. Her speech is normal and behavior is normal.  Nursing note and vitals reviewed.    ED Treatments / Results  Labs (all labs ordered are listed, but  only abnormal results are displayed) Labs Reviewed  CBG MONITORING, ED    EKG None  Radiology No results found.  Procedures Procedures (including critical care time)  Medications Ordered in ED Medications - No data to display   Initial Impression / Assessment and Plan / ED Course  I have reviewed the triage vital signs and the nursing notes.  Pertinent labs & imaging results that were available during my care of the patient were reviewed by me and considered in my medical decision making (see chart for details).    Will check patient's CBG here.  He had no signs of infection.  Encouraged to follow-up with her podiatrist  Final Clinical Impressions(s) /  ED Diagnoses   Final diagnoses:  None    ED Discharge Orders    None       Lacretia Leigh, MD 08/01/17 2213

## 2017-08-01 NOTE — ED Triage Notes (Signed)
Per Patient Pt noticed a small corn/callus on left foot. Worried due her diabetes. Also described tingling feeling in foot.

## 2017-08-01 NOTE — Telephone Encounter (Signed)
Pt has  questions   About  Soaking  Feet  Pt  Is  A  Diabetic  Who  Has  Had  Feet  Problems  In past  Has  Seen Dr  Paulla [name]  5  Months  Ago  She   Has  Not been  Checking her  Glucose  And  Has  Missed  Her  followup  Lab recommendations.  She  Denies  Any  Sores  On her  Feet  She  Reports her feet ache at times  As  She is  Up on her feet a lot at work   She  Was  Advised  To keep her feet clean and  Dry  But to avoid  Soaking her feet for any period till  She  Gets her feet evaulated.   She  Was recommended  To wear  Proper foot wear  Not  too  Tight and  To check at a  [employer] supply store  For their availability   She  States  She  Will  followup  With an appt  With her foot doctor and  Her medical  Doctor but at this  Point she  Is  Just asking about soaking her feet

## 2017-09-29 ENCOUNTER — Telehealth: Payer: Self-pay

## 2017-09-29 NOTE — Telephone Encounter (Signed)
Left detailed vm for pt to schedule an ov for refills.

## 2017-10-31 ENCOUNTER — Other Ambulatory Visit: Payer: Self-pay | Admitting: Internal Medicine

## 2017-11-01 ENCOUNTER — Telehealth: Payer: Self-pay | Admitting: Internal Medicine

## 2017-11-01 NOTE — Telephone Encounter (Signed)
Patient need to schedule an ov for more refills. 

## 2017-11-01 NOTE — Telephone Encounter (Signed)
Copied from [place] [id number]. Topic: Quick Communication - Rx Refill/Question >> Nov 01, 2017 10:22 AM Pricilla Handler wrote: Medication: pravastatin (PRAVACHOL) 40 MG tablet  Has the patient contacted their pharmacy? Yes.   (Agent: If no, request that the patient contact the pharmacy for the refill.) (Agent: If yes, when and what did the pharmacy advise?) Pharmacy faxed a request, but did not hear back from the office.  Preferred Pharmacy (with phone number or street name): [facility] [place] [place] [address] Phone: [phone] Fax: [phone] Not a 24 hour pharmacy; exact hours not known    Agent: Please be advised that RX refills may take up to 3 business days. We ask that you follow-up with your pharmacy.

## 2017-11-03 NOTE — Telephone Encounter (Signed)
Left detailed message informing pt of update.  Rx was sent on 05/2017 for a year.

## 2017-12-29 ENCOUNTER — Other Ambulatory Visit: Payer: Self-pay | Admitting: Internal Medicine

## 2018-02-26 ENCOUNTER — Ambulatory Visit (INDEPENDENT_AMBULATORY_CARE_PROVIDER_SITE_OTHER): Payer: BC Managed Care – PPO | Admitting: Family Medicine

## 2018-02-26 ENCOUNTER — Other Ambulatory Visit: Payer: BC Managed Care – PPO

## 2018-02-26 ENCOUNTER — Encounter: Payer: Self-pay | Admitting: Family Medicine

## 2018-02-26 VITALS — BP 124/83 | HR 60 | Temp 97.7°F | Resp 12 | Ht 64.5 in | Wt 180.5 lb

## 2018-02-26 DIAGNOSIS — Z9189 Other specified personal risk factors, not elsewhere classified: Secondary | ICD-10-CM

## 2018-02-26 DIAGNOSIS — Z23 Encounter for immunization: Secondary | ICD-10-CM

## 2018-02-26 DIAGNOSIS — E785 Hyperlipidemia, unspecified: Secondary | ICD-10-CM | POA: Diagnosis not present

## 2018-02-26 DIAGNOSIS — I679 Cerebrovascular disease, unspecified: Secondary | ICD-10-CM

## 2018-02-26 DIAGNOSIS — E1142 Type 2 diabetes mellitus with diabetic polyneuropathy: Secondary | ICD-10-CM | POA: Diagnosis not present

## 2018-02-26 DIAGNOSIS — E1151 Type 2 diabetes mellitus with diabetic peripheral angiopathy without gangrene: Secondary | ICD-10-CM

## 2018-02-26 DIAGNOSIS — Z1159 Encounter for screening for other viral diseases: Secondary | ICD-10-CM

## 2018-02-26 DIAGNOSIS — I10 Essential (primary) hypertension: Secondary | ICD-10-CM

## 2018-02-26 LAB — LIPID PANEL
CHOL/HDL RATIO: 5
CHOLESTEROL: 227 mg/dL — AB (ref 0–200)
HDL: 42 mg/dL (ref >39.00)
LDL Cholesterol: 158 mg/dL — ABNORMAL HIGH (ref 0–99)
NonHDL: 184.74
TRIGLYCERIDES: 132 mg/dL (ref 0.0–149.0)
VLDL: 26.4 mg/dL (ref 0.0–40.0)

## 2018-02-26 LAB — MICROALBUMIN / CREATININE URINE RATIO
Creatinine,U: 32.5 mg/dL
Microalb Creat Ratio: 11.7 mg/g (ref 0.0–30.0)
Microalb, Ur: 3.8 mg/dL — ABNORMAL HIGH (ref 0.0–1.9)

## 2018-02-26 NOTE — Assessment & Plan Note (Signed)
It has been poorly controlled. She will continue Lantus 120 units for now. Endocrinology referral placed to continue following with Dr. Loanne Drilling.  Regular exercise and healthy diet with avoidance of added sugar food intake is an important part of treatment and recommended. Annual eye exam, periodic dental and foot care recommended.

## 2018-02-26 NOTE — Assessment & Plan Note (Signed)
Adequately controlled. No changes in current management. Low salt diet recommended. Eye exam recommended annually. F/U in 6 months, before if needed.  

## 2018-02-26 NOTE — Assessment & Plan Note (Addendum)
Educated about some of the complications of poorly controlled DM 2. We discussed foot care in detail. For now I do not recommend medication, she needs to better control BS's.

## 2018-02-26 NOTE — Assessment & Plan Note (Signed)
Educated about the importance of will control of hypertension, diabetes, and hyperlipidemia in order to prevent future episodes. Continue Plavix and atorvastatin.

## 2018-02-26 NOTE — Patient Instructions (Addendum)
A few things to remember from today's visit:   DM (diabetes mellitus), type 2 with peripheral vascular complications ([place]) - Plan: Microalbumin / creatinine urine ratio, Fructosamine, Hemoglobin A1c, Ambulatory referral to Endocrinology  Diabetic peripheral neuropathy associated with type 2 diabetes mellitus ([place])  Dyslipidemia - Plan: Lipid panel  Encounter for HCV screening test for high risk patient - Plan: Hepatitis C antibody   Diabetes and Foot Care Diabetes may cause you to have problems because of poor blood supply (circulation) to your feet and legs. This may cause the skin on your feet to become thinner, break easier, and heal more slowly. Your skin may become dry, and the skin may peel and crack. You may also have nerve damage in your legs and feet causing decreased feeling in them. You may not notice minor injuries to your feet that could lead to infections or more serious problems. Taking care of your feet is one of the most important things you can do for yourself. Follow these instructions at home:  Wear shoes at all times, even in the house. Do not go barefoot. Bare feet are easily injured.  Check your feet daily for blisters, cuts, and redness. If you cannot see the bottom of your feet, use a mirror or ask someone for help.  Wash your feet with warm water (do not use hot water) and mild soap. Then pat your feet and the areas between your toes until they are completely dry. Do not soak your feet as this can dry your skin.  Apply a moisturizing lotion or petroleum jelly (that does not contain alcohol and is unscented) to the skin on your feet and to dry, brittle toenails. Do not apply lotion between your toes.  Trim your toenails straight across. Do not dig under them or around the cuticle. File the edges of your nails with an emery board or nail file.  Do not cut corns or calluses or try to remove them with medicine.  Wear clean socks or stockings every day. Make sure they  are not too tight. Do not wear knee-high stockings since they may decrease blood flow to your legs.  Wear shoes that fit properly and have enough cushioning. To break in new shoes, wear them for just a few hours a day. This prevents you from injuring your feet. Always look in your shoes before you put them on to be sure there are no objects inside.  Do not cross your legs. This may decrease the blood flow to your feet.  If you find a minor scrape, cut, or break in the skin on your feet, keep it and the skin around it clean and dry. These areas may be cleansed with mild soap and water. Do not cleanse the area with peroxide, alcohol, or iodine.  When you remove an adhesive bandage, be sure not to damage the skin around it.  If you have a wound, look at it several times a day to make sure it is healing.  Do not use heating pads or hot water bottles. They may burn your skin. If you have lost feeling in your feet or legs, you may not know it is happening until it is too late.  Make sure your health care provider performs a complete foot exam at least annually or more often if you have foot problems. Report any cuts, sores, or bruises to your health care provider immediately. Contact a health care provider if:  You have an injury that is not healing.  You have cuts or breaks in the skin.  You have an ingrown nail.  You notice redness on your legs or feet.  You feel burning or tingling in your legs or feet.  You have pain or cramps in your legs and feet.  Your legs or feet are numb.  Your feet always feel cold. Get help right away if:  There is increasing redness, swelling, or pain in or around a wound.  There is a red line that goes up your leg.  Pus is coming from a wound.  You develop a fever or as directed by your health care provider.  You notice a bad smell coming from an ulcer or wound. This information is not intended to replace advice given to you by your health care  provider. Make sure you discuss any questions you have with your health care provider. Document Released: 04/15/2000 Document Revised: 09/24/2015 Document Reviewed: 09/25/2012 Elsevier Interactive Patient Education  2017 [employer].  Please be sure medication list is accurate. If a new problem present, please set up appointment sooner than planned today.
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
## 2018-02-26 NOTE — Assessment & Plan Note (Signed)
No changes in current management, will follow labs done today and will give further recommendations accordingly. LDL goal < 70.

## 2018-02-27 LAB — HEMOGLOBIN A1C: Hgb A1c MFr Bld: >14 % of total Hgb — ABNORMAL HIGH (ref <5.7)

## 2018-03-01 MED ORDER — INSULIN ASPART 100 UNIT/ML FLEXPEN: PEN_INJECTOR | SUBCUTANEOUS | 1 refills | Status: DC

## 2018-03-01 MED ORDER — ATORVASTATIN CALCIUM 40 MG PO TABS: 40.0000 mg | ORAL_TABLET | Freq: Every day | ORAL | 3 refills | Status: DC

## 2018-03-06 LAB — HEPATITIS C ANTIBODY
HEP C AB: NONREACTIVE
SIGNAL TO CUT-OFF: 0.03 (ref <1.00)

## 2018-03-06 LAB — FRUCTOSAMINE: FRUCTOSAMINE: 518 umol/L — AB (ref 205–285)

## 2018-03-09 ENCOUNTER — Telehealth: Payer: Self-pay | Admitting: Family Medicine

## 2018-03-09 ENCOUNTER — Encounter: Payer: Self-pay | Admitting: Endocrinology

## 2018-03-09 ENCOUNTER — Ambulatory Visit (INDEPENDENT_AMBULATORY_CARE_PROVIDER_SITE_OTHER): Payer: BC Managed Care – PPO | Admitting: Endocrinology

## 2018-03-09 VITALS — BP 142/70 | HR 56 | Ht 64.5 in | Wt 177.4 lb

## 2018-03-09 DIAGNOSIS — E1151 Type 2 diabetes mellitus with diabetic peripheral angiopathy without gangrene: Secondary | ICD-10-CM | POA: Diagnosis not present

## 2018-03-09 MED ORDER — INSULIN GLARGINE 100 UNIT/ML SOLOSTAR PEN: 120.0000 [IU] | PEN_INJECTOR | SUBCUTANEOUS | 11 refills | Status: DC

## 2018-03-09 MED ORDER — GLUCOSE BLOOD VI STRP: 1.0000 | ORAL_STRIP | Freq: Two times a day (BID) | 3 refills | Status: DC

## 2018-03-09 NOTE — Patient Instructions (Addendum)
Here is a new meter.  I have sent a prescription to your pharmacy, for strips.   I have sent a prescription to your pharmacy, for the lantus.  check your blood sugar  a day.  vary the time of day when you check, between before the 3 meals, and at bedtime.  also check if you have symptoms of your blood sugar being too high or too low.  please keep a record of the readings and bring it to your next appointment here (or you can bring the meter itself).  You can write it on any piece of paper.  please call us sooner if your blood sugar goes below 70, or if you have a lot of readings over 200.  Please come back for a follow-up appointment in 2 weeks.

## 2018-03-09 NOTE — Telephone Encounter (Signed)
Form received from [facility] for Lantus medication Patient has signed her part.  Also gave patient a copay card to try Form placed in folder to be given to Quaneisha/Dr [name] to complete.

## 2018-03-09 NOTE — Progress Notes (Signed)
Subjective:    Patient ID: [name], female    DOB: 1960/10/30, 57 y.o.   MRN: [id number]  HPI Pt returns for f/u of diabetes mellitus:  DM type: Insulin-requiring type 2.   Dx'ed: 2010.   Complications: cerebrovascular disease.   Therapy: insulin since soon after dx.  GDM: never.  DKA: never.  Severe hypoglycemia: never.  Pancreatitis: never Other: She requests to take just 1 injection per day, as she is having difficulty remembering the multiple daily injections.   Interval history: she returns after a gap in her insurance.  She takes lantus only.  She says her meter is broken.   Past Medical History:  Diagnosis Date  . Abnormality of gait 05/10/2010  . BACK PAIN 11/14/2008  . CEREBROVASCULAR DISEASE 08/05/2008  . DIABETES MELLITUS, TYPE II 07/15/2008  . Diplopia 07/15/2008  . ECZEMA, ATOPIC 04/03/2009  . Guillain-Barre ([place])   . HYPERLIPIDEMIA 03/06/2009  . HYPERTENSION 07/15/2008  . Stroke ([place]) 2010   x2   . Vertebral artery stenosis     Past Surgical History:  Procedure Laterality Date  . ABDOMINAL HYSTERECTOMY    . DILATION AND CURETTAGE OF UTERUS    . FOOT SURGERY      Social History   Socioeconomic History  . Marital status: Single    Spouse name: Not on file  . Number of children: Not on file  . Years of education: Not on file  . Highest education level: Not on file  Occupational History  . Not on file  Social Needs  . Financial resource strain: Not on file  . Food insecurity:    Worry: Not on file    Inability: Not on file  . Transportation needs:    Medical: Not on file    Non-medical: Not on file  Tobacco Use  . Smoking status: Never Smoker  . Smokeless tobacco: Never Used  Substance and Sexual Activity  . Alcohol use: No  . Drug use: No  . Sexual activity: Not on file  Lifestyle  . Physical activity:    Days per week: Not on file    Minutes per session: Not on file  . Stress: Not on file  Relationships  . Social connections:    Talks  on phone: Not on file    Gets together: Not on file    Attends religious service: Not on file    Active member of club or organization: Not on file    Attends meetings of clubs or organizations: Not on file    Relationship status: Not on file  . Intimate partner violence:    Fear of current or ex partner: Not on file    Emotionally abused: Not on file    Physically abused: Not on file    Forced sexual activity: Not on file  Other Topics Concern  . Not on file  Social History Narrative  . Not on file    Current Outpatient Medications on File Prior to Visit  Medication Sig Dispense Refill  . amLODipine (NORVASC) 10 MG tablet Take 1 tablet (10 mg total) by mouth daily. 90 tablet 3  . atorvastatin (LIPITOR) 40 MG tablet Take 1 tablet (40 mg total) by mouth daily. 90 tablet 3  . chlorthalidone (HYGROTON) 25 MG tablet Take 1 tablet (25 mg total) by mouth daily. 90 tablet 3  . clopidogrel (PLAVIX) 75 MG tablet Take 1 tablet (75 mg total) by mouth daily. 90 tablet 3  . FLUoxetine (PROZAC) 20 MG  capsule Take 1 capsule (20 mg total) by mouth daily. 90 capsule 3  . glucose blood (ONETOUCH VERIO) test strip USE TO CHECK BLOOD SUGAR TWICE A DAY AND PRN 100 each 6  . lisinopril-hydrochlorothiazide (PRINZIDE,ZESTORETIC) 20-25 MG tablet Take 1 tablet by mouth daily. 90 tablet 3  . omeprazole (PRILOSEC) 20 MG capsule TAKE 1 CAPSULE BY MOUTH ONCE DAILY 30 capsule 0  . ONE TOUCH LANCETS MISC USE TO CHECK BLOOD SUGAR TWICE A DAY AND PRN 100 each 6  . pravastatin (PRAVACHOL) 40 MG tablet Take 1 tablet (40 mg total) by mouth daily. 90 tablet 3  . triamcinolone ointment (KENALOG) 0.5 % APPLY  TWICE DAILY 15 g 1   No current facility-administered medications on file prior to visit.     No Known Allergies  Family History  Problem Relation Age of Onset  . Diabetes Sister   . Breast cancer Neg Hx     BP (!) 142/70 (BP Location: Left Arm, Patient Position: Sitting, Cuff Size: Normal)   Pulse (!) 56    Ht 5' 4.5" (1.638 m)   Wt 177 lb 6.4 oz (80.5 kg)   SpO2 96%   BMI 29.98 kg/m     Review of Systems She denies hypoglycemia. Denies n/vsob    Objective:   Physical Exam VITAL SIGNS:  See vs page GENERAL: no distress Pulses: foot pulses are intact bilaterally.   MSK: no deformity of the feet or ankles.  CV: no edema of the legs or ankles.  Skin:  no ulcer on the feet or ankles.  normal color and temp on the feet and ankles.  Neuro: sensation is intact to touch on the feet and ankles, but decreased from normal.    Lab Results  Component Value Date   HGBA1C >14.0 (H) 02/26/2018   Lab Results  Component Value Date   CREATININE 0.74 11/19/2016   BUN 16 11/19/2016   NA 136 11/19/2016   K 3.9 11/19/2016   CL 101 11/19/2016   CO2 27 11/19/2016       Assessment & Plan:  Insulin-requiring type 2 DM: worse Noncompliance with cbg recording and insulin: we discussed.    Patient Instructions  Here is a new meter.  I have sent a prescription to your pharmacy, for strips.   I have sent a prescription to your pharmacy, for the lantus.  check your blood sugar  a day.  vary the time of day when you check, between before the 3 meals, and at bedtime.  also check if you have symptoms of your blood sugar being too high or too low.  please keep a record of the readings and bring it to your next appointment here (or you can bring the meter itself).  You can write it on any piece of paper.  please call us sooner if your blood sugar goes below 70, or if you have a lot of readings over 200.  Please come back for a follow-up appointment in 2 weeks.

## 2018-03-12 NOTE — Telephone Encounter (Signed)
Form placed on doctor's desk for completion. 

## 2018-03-13 NOTE — Telephone Encounter (Signed)
Form completed and faxed to [employer].  °

## 2018-03-17 ENCOUNTER — Other Ambulatory Visit: Payer: Self-pay | Admitting: Internal Medicine

## 2018-03-21 ENCOUNTER — Telehealth: Payer: Self-pay | Admitting: Endocrinology

## 2018-03-21 DIAGNOSIS — E1151 Type 2 diabetes mellitus with diabetic peripheral angiopathy without gangrene: Secondary | ICD-10-CM

## 2018-03-21 NOTE — Telephone Encounter (Signed)
Called pt to make her aware that a referral has been placed by Dr. Loanne Drilling. Can take up to 2-3 weeks to process a referral. Should she have any further questions, she may call [facility] @ [phone]. LVM

## 2018-03-21 NOTE — Telephone Encounter (Signed)
Please advise 

## 2018-03-21 NOTE — Telephone Encounter (Signed)
Per [place] asking for new referral to a nutritionist from her doctor. It is for Dr Tyrone Nine [phone]

## 2018-03-21 NOTE — Telephone Encounter (Signed)
I placed referral.

## 2018-03-23 ENCOUNTER — Ambulatory Visit (INDEPENDENT_AMBULATORY_CARE_PROVIDER_SITE_OTHER): Payer: BC Managed Care – PPO | Admitting: Family Medicine

## 2018-03-23 ENCOUNTER — Encounter: Payer: Self-pay | Admitting: Family Medicine

## 2018-03-23 VITALS — BP 130/80 | HR 63 | Temp 98.2°F | Resp 12 | Ht 64.5 in | Wt 176.1 lb

## 2018-03-23 DIAGNOSIS — R200 Anesthesia of skin: Secondary | ICD-10-CM

## 2018-03-23 DIAGNOSIS — E1142 Type 2 diabetes mellitus with diabetic polyneuropathy: Secondary | ICD-10-CM

## 2018-03-23 DIAGNOSIS — M25541 Pain in joints of right hand: Secondary | ICD-10-CM

## 2018-03-23 DIAGNOSIS — M25542 Pain in joints of left hand: Secondary | ICD-10-CM

## 2018-03-23 DIAGNOSIS — G5603 Carpal tunnel syndrome, bilateral upper limbs: Secondary | ICD-10-CM

## 2018-03-23 DIAGNOSIS — L602 Onychogryphosis: Secondary | ICD-10-CM | POA: Diagnosis not present

## 2018-03-23 LAB — VITAMIN B12: VITAMIN B 12: 669 pg/mL (ref 211–911)

## 2018-03-23 NOTE — Patient Instructions (Addendum)
A few things to remember from today's visit:   Bilateral hand numbness - Plan: NCV with EMG(electromyography), Vitamin B12  Diabetic peripheral neuropathy associated with type 2 diabetes mellitus (HCC)  Hypertrophic toenail   Carpal Tunnel Syndrome Carpal tunnel syndrome is a condition that causes pain in your hand and arm. The carpal tunnel is a narrow area that is on the palm side of your wrist. Repeated wrist motion or certain diseases may cause swelling in the tunnel. This swelling can pinch the main nerve in the wrist (median nerve). Follow these instructions at home: If you have a splint:  Wear it as told by your doctor. Remove it only as told by your doctor.  Loosen the splint if your fingers: ? Become numb and tingle. ? Turn blue and cold.  Keep the splint clean and dry. General instructions  Take over-the-counter and prescription medicines only as told by your doctor.  Rest your wrist from any activity that may be causing your pain. If needed, talk to your employer about changes that can be made in your work, such as getting a wrist pad to use while typing.  If directed, apply ice to the painful area: ? Put ice in a plastic bag. ? Place a towel between your skin and the bag. ? Leave the ice on for 20 minutes, 2-3 times per day.  Keep all follow-up visits as told by your doctor. This is important.  Do any exercises as told by your doctor, physical therapist, or occupational therapist. Contact a doctor if:  You have new symptoms.  Medicine does not help your pain.  Your symptoms get worse. This information is not intended to replace advice given to you by your health care provider. Make sure you discuss any questions you have with your health care provider. Document Released: 04/07/2011 Document Revised: 09/24/2015 Document Reviewed: 09/03/2014 Elsevier Interactive Patient Education  2018 [employer].  Please be sure medication list is accurate. If a new  problem present, please set up appointment sooner than planned today.

## 2018-03-23 NOTE — Progress Notes (Signed)
ACUTE VISIT   HPI:  Chief Complaint  Patient presents with  . Hand Pain    Bilateral hand pain, with tingling  . Foot Pain    Bilateral foot pain with tingling  . Numbness    in both hands and feet    [name] is a 57 y.o. female, who is here today complaining of persistent numbness and joint soreness in hand, bilateral. This has been going on for a while, R>L, it seems to be stable. Constant numbness on fingertips and palms, it seems to be worse at night, alleviated by shaking hands. IP joint soreness, no erythema or edema. 5/10 Exacerbated by manual activities. No weakness.  She denies neck pain or numbness/tingling of forearm or arm. She has not tried OTC treatments.   She also mentions "weird" sensation on feet,intermittently.  This has also been going on for a while and stable. She is not sure about exacerbating or alleviating factors. She has history of diabetes.   She is also concerned about thick toenail,wonders about fungal infection. No tenderness,erythema,or periungual edema. She has not used OTC treatment. Problem has been going on for a while and it seems stable.    Review of Systems  Constitutional: Negative for chills, fatigue and fever.  Respiratory: Negative for cough, shortness of breath and wheezing.   Gastrointestinal: Negative for abdominal pain, nausea and vomiting.  Endocrine: Negative for polydipsia, polyphagia and polyuria.  Musculoskeletal: Positive for arthralgias. Negative for joint swelling and neck pain.  Skin: Negative for color change and pallor.  Neurological: Positive for numbness. Negative for weakness.  Psychiatric/Behavioral: Positive for sleep disturbance.      Current Outpatient Medications on File Prior to Visit  Medication Sig Dispense Refill  . amLODipine (NORVASC) 10 MG tablet Take 1 tablet (10 mg total) by mouth daily. 90 tablet 3  . atorvastatin (LIPITOR) 40 MG tablet Take 1 tablet (40 mg total) by  mouth daily. 90 tablet 3  . chlorthalidone (HYGROTON) 25 MG tablet Take 1 tablet (25 mg total) by mouth daily. 90 tablet 3  . clopidogrel (PLAVIX) 75 MG tablet Take 1 tablet (75 mg total) by mouth daily. 90 tablet 3  . FLUoxetine (PROZAC) 20 MG capsule Take 1 capsule (20 mg total) by mouth daily. 90 capsule 3  . glucose blood (CONTOUR NEXT TEST) test strip 1 each by Other route 2 (two) times daily. And lancets 2/day 200 each 3  . glucose blood (ONETOUCH VERIO) test strip USE TO CHECK BLOOD SUGAR TWICE A DAY AND PRN 100 each 6  . Insulin Glargine (LANTUS) 100 UNIT/ML Solostar Pen Inject 120 Units into the skin every morning. And pen needles 1/day 15 pen 11  . lisinopril-hydrochlorothiazide (PRINZIDE,ZESTORETIC) 20-25 MG tablet Take 1 tablet by mouth daily. 90 tablet 3  . omeprazole (PRILOSEC) 20 MG capsule TAKE 1 CAPSULE BY MOUTH ONCE DAILY 30 capsule 0  . ONE TOUCH LANCETS MISC USE TO CHECK BLOOD SUGAR TWICE A DAY AND PRN 100 each 6  . pravastatin (PRAVACHOL) 40 MG tablet Take 1 tablet (40 mg total) by mouth daily. 90 tablet 3  . triamcinolone ointment (KENALOG) 0.5 % APPLY  TWICE DAILY 15 g 1   No current facility-administered medications on file prior to visit.      Past Medical History:  Diagnosis Date  . Abnormality of gait 05/10/2010  . BACK PAIN 11/14/2008  . CEREBROVASCULAR DISEASE 08/05/2008  . DIABETES MELLITUS, TYPE II 07/15/2008  . Diplopia 07/15/2008  .  ECZEMA, ATOPIC 04/03/2009  . Gu[place]ain-[place]re ([place])   . HYPERLIPIDEMIA 03/06/2009  . HYPERTENSION 07/15/2008  . Stroke ([place]) 2010   x2   . Vertebral artery stenosis    No Known Allergies  Social History   Socioeconomic History  . Marital status: Single    Spouse name: Not on file  . Number of children: Not on file  . Years of education: Not on file  . Highest education level: Not on file  Occupational History  . Not on file  Social Needs  . Financial resource strain: Not on file  . Food insecurity:    Worry: Not on file      Inability: Not on file  . Transportation needs:    Medical: Not on file    Non-medical: Not on file  Tobacco Use  . Smoking status: Never Smoker  . Smokeless tobacco: Never Used  Substance and Sexual Activity  . Alcohol use: No  . Drug use: No  . Sexual activity: Not on file  Lifestyle  . Physical activity:    Days per week: Not on file    Minutes per session: Not on file  . Stress: Not on file  Relationships  . Social connections:    Talks on phone: Not on file    Gets together: Not on file    Attends religious service: Not on file    Active member of club or organization: Not on file    Attends meetings of clubs or organizations: Not on file    Relationship status: Not on file  Other Topics Concern  . Not on file  Social History Narrative  . Not on file    Vitals:   03/23/18 0724  BP: 130/80  Pulse: 63  Resp: 12  Temp: 98.2 F (36.8 C)  SpO2: 98%   Body mass index is 29.76 kg/m.    Physical Exam  Nursing note and vitals reviewed. Constitutional: She is oriented to person, place, and time. She appears well-developed. No distress.  HENT:  Head: Normocephalic and atraumatic.  Mouth/Throat: Oropharynx is clear and moist and mucous membranes are normal.  Eyes: Pupils are equal, round, and reactive to light. Conjunctivae are normal.  Cardiovascular: Normal rate and regular rhythm.  No murmur heard. Pulses:      Radial pulses are 2+ on the right side, and 2+ on the left side.  Respiratory: Effort normal and breath sounds normal. No respiratory distress.  Musculoskeletal: She exhibits no edema.  Tinel and Phanel neg, bilateral. Mild decrease of wrist flexion, bilateral.  No signs of synovitis. No tenderness upon palpation of IP joints.   Lymphadenopathy:    She has no cervical adenopathy.  Neurological: She is alert and oriented to person, place, and time. She has normal strength. No cranial nerve deficit. Gait normal.  Skin: Skin is warm. No rash  noted. No erythema.  Toe nail hypertrophic, mild hyperpigmentation changes. Toe nails with smooth surfice  Psychiatric: She has a normal mood and affect.  Well groomed, good eye contact.      ASSESSMENT AND PLAN:   [name] was seen today for hand pain, foot pain and numbness.  Diagnoses and all orders for this visit:  Bilateral hand numbness Possible etiologies discussed. She is not interested in in ortho/sport med referral. EMG will be arranged.  -     NCV with EMG(electromyography); Future -     Vitamin B12  Diabetic peripheral neuropathy associated with type 2 diabetes mellitus ([place]) Educated about  Dx and prognosis. Adequate DM controlled. Foot care discussed.  Hypertrophic toenail Vick Vapor rub may help. No finding that suggest onychomycosis. She is planning on arranging appointment with podiatrist.  Bilateral carpal tunnel syndrome Wrist splint right side placed in office to war at night. Instructed to get one for her left wrist. For now she is not interested in invasive procedures.  Arthralgia of both hands Most likely OA. I do not think work up is necessary today. Tylenol 500 mg tid prn may help.     Return if symptoms worsen or fail to improve.        [name]. [name], MD  [facility]. [place] office.

## 2018-03-27 ENCOUNTER — Encounter: Payer: Self-pay | Admitting: Family Medicine

## 2018-04-13 ENCOUNTER — Encounter: Payer: Self-pay | Admitting: Registered"

## 2018-04-13 ENCOUNTER — Encounter: Payer: BC Managed Care – PPO | Attending: Endocrinology | Admitting: Registered"

## 2018-04-13 DIAGNOSIS — E119 Type 2 diabetes mellitus without complications: Secondary | ICD-10-CM | POA: Diagnosis present

## 2018-04-13 DIAGNOSIS — Z713 Dietary counseling and surveillance: Secondary | ICD-10-CM | POA: Diagnosis not present

## 2018-04-13 NOTE — Patient Instructions (Addendum)
Goals:  Follow Diabetes Meal Plan as instructed  Eat 3 meals and 2 snacks, every 3-5 hrs  Limit carbohydrate intake to 45-60 grams carbohydrate/meal  Limit carbohydrate intake to 15 grams carbohydrate/snack  Add lean protein foods to meals/snacks  Monitor glucose levels as instructed by your doctor  Aim for 30 mins of physical activity daily  Bring food record and glucose log to your next nutrition visit  Aim to increase fiber intake by having 1/2 plate as non-starchy vegetables with lunch and dinner  When traveling for holidays look at carbohydrate amounts of items; aim to stay between 45-60 grams of carbohydrates per meal

## 2018-04-13 NOTE — Progress Notes (Signed)
Diabetes Self-Management Education  Visit Type:  First/Initial  Appt. Start Time: 8:00 Appt. End Time: 9:15  04/13/2018  [name], identified by name and date of birth, is a 57 y.o. female with a diagnosis of Diabetes: Type 2.   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 Pt states she gets a little light-headed when turning head sometimes. Pt states she has been working on decreasing cholesterol. Pt states her feet feel funny at times, states they feel puffy but look fine.   Pt reports history of 2 strokes (2010, 2011).   Pt states she does not like oatmeal and does not think about food during lunch; just eats something so that she can hurry and finish her tasks to leave work on time. Pt states she heard its good to drink 2 glasses of water before eating to help with weight loss and she has been following that guideline. Pt reports drinking 1 sugar-free soda/diet mountain dew a day.   Next visit: frequency of checking BS, and remainder of book  There were no vitals taken for this visit. There is no height or weight on file to calculate BMI.   Diabetes Self-Management Education - 04/13/18 0804      Health Coping   How would you rate your overall health?  Fair      Psychosocial Assessment   Patient Belief/Attitude about Diabetes  Motivated to manage diabetes    Self-care barriers  None    Patient Concerns  Nutrition/Meal planning    Special Needs  None    Preferred Learning Style  No preference indicated    Learning Readiness  Ready      Complications   Last HgB A1C per patient/outside source  --   >14.0   How often do you check your blood sugar?  1-2 times/day    Fasting Blood glucose range (mg/dL)  >200    Postprandial Blood glucose range (mg/dL)  180-200;>200    Number of hypoglycemic episodes per month  5    Can you tell when your blood sugar is low?  Yes    What do you  do if your blood sugar is low?  drinks orange juice or glucose tablets    Number of hyperglycemic episodes per week  0    Have you had a dilated eye exam in the past 12 months?  Yes    Have you had a dental exam in the past 12 months?  No    Are you checking your feet?  Yes    How many days per week are you checking your feet?  7      Dietary Intake   Breakfast  grits + boiled egg + sausage    Snack (morning)  none    Lunch  fast food-burger + fries (sometimes) or sandwich or chicken wings + macaroni and cheese + okra    Snack (afternoon)  sometimes chips    Dinner  baked chicken + rice + green beans or chicken noodle soup + 3 chicken wings    Snack (evening)  popcorn    Beverage(s)  water, orange juice, soda (sometimes), lemonade (diet and regular), sweet tea (sometimes)      Exercise   Exercise Type  Light (walking / raking leaves)    How many days per week to you exercise?  5    How many minutes per day  do you exercise?  30    Total minutes per week of exercise  150      Patient Education   Previous Diabetes Education  No    Disease state   Definition of diabetes, type 1 and 2, and the diagnosis of diabetes;Factors that contribute to the development of diabetes    Nutrition management   Food label reading, portion sizes and measuring food.;Role of diet in the treatment of diabetes and the relationship between the three main macronutrients and blood glucose level;Effects of alcohol on blood glucose and safety factors with consumption of alcohol.;Information on hints to eating out and maintain blood glucose control.;Reviewed blood glucose goals for pre and post meals and how to evaluate the patients' food intake on their blood glucose level.    Monitoring  Identified appropriate SMBG and/or A1C goals.;Interpreting lab values - A1C, lipid, urine microalbumina.    Acute complications  Taught treatment of hypoglycemia - the 15 rule.;Discussed and identified patients' treatment of  hyperglycemia.    Psychosocial adjustment  Travel strategies;Worked with patient to identify barriers to care and solutions    Preconception care  --   N/A     Individualized Goals (developed by patient)   Nutrition  General guidelines for healthy choices and portions discussed    Physical Activity  Exercise 3-5 times per week;30 minutes per day    Medications  take my medication as prescribed    Reducing Risk  treat hypoglycemia with 15 grams of carbs if blood glucose less than 70mg /dL;increase portions of healthy fats;increase portions of olive oil in diet;increase portions of nuts and seeds;do foot checks daily    Health Coping  ask for help with (comment)   discomfort in hands and/or feet     Post-Education Assessment   Patient understands the diabetes disease and treatment process.  Demonstrates understanding / competency    Patient understands incorporating nutritional management into lifestyle.  Demonstrates understanding / competency    Patient undertands incorporating physical activity into lifestyle.  Needs Review    Patient understands using medications safely.  Demonstrates understanding / competency    Patient understands monitoring blood glucose, interpreting and using results  Demonstrates understanding / competency    Patient understands prevention, detection, and treatment of acute complications.  Needs Review    Patient understands prevention, detection, and treatment of chronic complications.  Needs Instruction    Patient understands how to develop strategies to address psychosocial issues.  Needs Instruction    Patient understands how to develop strategies to promote health/change behavior.  Needs Instruction      Outcomes   Program Status  Not Completed       Learning Objective:  Patient will have a greater understanding of diabetes self-management. Patient education plan is to attend individual and/or group sessions per assessed needs and concerns.   Plan:   Patient Instructions Goals:  Follow Diabetes Meal Plan as instructed  Eat 3 meals and 2 snacks, every 3-5 hrs  Limit carbohydrate intake to 45-60 grams carbohydrate/meal  Limit carbohydrate intake to 15 grams carbohydrate/snack  Add lean protein foods to meals/snacks  Monitor glucose levels as instructed by your doctor  Aim for 30 mins of physical activity daily  Bring food record and glucose log to your next nutrition visit  Aim to increase fiber intake by having 1/2 plate as non-starchy vegetables with lunch and dinner  When traveling for holidays look at carbohydrate amounts of items; aim to stay between 45-60 grams of carbohydrates  per meal   Expected Outcomes:  Demonstrated interest in learning. Expect positive outcomes  Education material provided: ADA Diabetes: Your Take Control Guide and My Plate  If problems or questions, patient to contact team via:  Phone and Email  Future DSME appointment: - 4-6 wks

## 2018-04-20 ENCOUNTER — Ambulatory Visit (INDEPENDENT_AMBULATORY_CARE_PROVIDER_SITE_OTHER): Payer: BC Managed Care – PPO | Admitting: Family Medicine

## 2018-04-20 ENCOUNTER — Encounter: Payer: Self-pay | Admitting: Family Medicine

## 2018-04-20 VITALS — BP 126/84 | HR 62 | Temp 97.9°F | Resp 12 | Ht 64.5 in | Wt 172.5 lb

## 2018-04-20 DIAGNOSIS — R200 Anesthesia of skin: Secondary | ICD-10-CM

## 2018-04-20 DIAGNOSIS — R198 Other specified symptoms and signs involving the digestive system and abdomen: Secondary | ICD-10-CM | POA: Diagnosis not present

## 2018-04-20 DIAGNOSIS — E1142 Type 2 diabetes mellitus with diabetic polyneuropathy: Secondary | ICD-10-CM | POA: Diagnosis not present

## 2018-04-20 NOTE — Assessment & Plan Note (Signed)
Chronic,?  IBS. Adequate fiber and fluid intake. Recommend Metamucil daily as needed.

## 2018-04-20 NOTE — Progress Notes (Signed)
HPI:  Chief Complaint  Patient presents with  . Numbness    in hands and feet started a while ago    [name] is a 57 y.o. female, who is here today concerned about diabetic neuropathy.  She was last seen on 03/23/2017, when she was complaining about bilateral hand numbness and "weird" sensation on feet, intermittently for a while. Hand numbness and tingling is constant, it seems to be worse at night and right hand is worse than left hand. Problem seems to be stable for years. Alleviated by checking her hands. Also associated IP joint pain, soreness and stiffness, 5/10. Joint pain is exacerbated by repetitive manual activities. She has not noted joint edema or erythema.  She denies associated cervical pain or weakness.  Diabetes has not been well controlled for a while, she has not been consistent with pharmacologic treatment due to insurance issues. She has not been consistent with dietary recommendations. Recently she met with nutritionist, who mentioned her history of neuropathy.  She read some information about this problem and has some questions.  Lab Results  Component Value Date   HGBA1C >14.0 (H) 02/26/2018   She is also complaining about constipation, which she has had for a few years now.   She also has occasional soft and watery stools. She denies associated abdominal pain or blood in the stool. She takes MiraLAX as needed for constipation. Negative for nausea, vomiting, or abnormal weight loss. Last colonoscopy in 03/2010.   Review of Systems  Constitutional: Positive for fatigue.  HENT: Negative for mouth sores and sore throat.   Respiratory: Negative for cough, shortness of breath and wheezing.   Cardiovascular: Negative for chest pain and leg swelling.  Gastrointestinal: Positive for constipation and diarrhea. Negative for abdominal pain, blood in stool, nausea and vomiting.  Endocrine: Negative for polydipsia, polyphagia and polyuria.    Genitourinary: Negative for decreased urine volume, dysuria and hematuria.  Musculoskeletal: Positive for arthralgias. Negative for neck pain.  Skin: Negative for rash and wound.  Neurological: Positive for numbness. Negative for syncope, weakness and headaches.  Psychiatric/Behavioral: Negative for confusion. The patient is nervous/anxious.       Current Outpatient Medications on File Prior to Visit  Medication Sig Dispense Refill  . amLODipine (NORVASC) 10 MG tablet Take 1 tablet (10 mg total) by mouth daily. 90 tablet 3  . atorvastatin (LIPITOR) 40 MG tablet Take 1 tablet (40 mg total) by mouth daily. 90 tablet 3  . chlorthalidone (HYGROTON) 25 MG tablet Take 1 tablet (25 mg total) by mouth daily. 90 tablet 3  . clopidogrel (PLAVIX) 75 MG tablet Take 1 tablet (75 mg total) by mouth daily. 90 tablet 3  . FLUoxetine (PROZAC) 20 MG capsule Take 1 capsule (20 mg total) by mouth daily. 90 capsule 3  . glucose blood (CONTOUR NEXT TEST) test strip 1 each by Other route 2 (two) times daily. And lancets 2/day 200 each 3  . glucose blood (ONETOUCH VERIO) test strip USE TO CHECK BLOOD SUGAR TWICE A DAY AND PRN 100 each 6  . Insulin Glargine (LANTUS) 100 UNIT/ML Solostar Pen Inject 120 Units into the skin every morning. And pen needles 1/day 15 pen 11  . lisinopril-hydrochlorothiazide (PRINZIDE,ZESTORETIC) 20-25 MG tablet Take 1 tablet by mouth daily. 90 tablet 3  . omeprazole (PRILOSEC) 20 MG capsule TAKE 1 CAPSULE BY MOUTH ONCE DAILY 30 capsule 0  . ONE TOUCH LANCETS MISC USE TO CHECK BLOOD SUGAR TWICE A  DAY AND PRN 100 each 6  . pravastatin (PRAVACHOL) 40 MG tablet Take 1 tablet (40 mg total) by mouth daily. 90 tablet 3  . triamcinolone ointment (KENALOG) 0.5 % APPLY  TWICE DAILY 15 g 1   No current facility-administered medications on file prior to visit.      Past Medical History:  Diagnosis Date  . Abnormality of gait 05/10/2010  . BACK PAIN 11/14/2008  . CEREBROVASCULAR DISEASE 08/05/2008   . DIABETES MELLITUS, TYPE II 07/15/2008  . Diplopia 07/15/2008  . ECZEMA, ATOPIC 04/03/2009  . Guillain-Barre ([place])   . HYPERLIPIDEMIA 03/06/2009  . HYPERTENSION 07/15/2008  . Stroke ([employer]) 2010   x2   . Vertebral artery stenosis    No Known Allergies  Social History   Socioeconomic History  . Marital status: Single    Spouse name: Not on file  . Number of children: Not on file  . Years of education: Not on file  . Highest education level: Not on file  Occupational History  . Not on file  Social Needs  . Financial resource strain: Not on file  . Food insecurity:    Worry: Not on file    Inability: Not on file  . Transportation needs:    Medical: Not on file    Non-medical: Not on file  Tobacco Use  . Smoking status: Never Smoker  . Smokeless tobacco: Never Used  Substance and Sexual Activity  . Alcohol use: No  . Drug use: No  . Sexual activity: Not on file  Lifestyle  . Physical activity:    Days per week: Not on file    Minutes per session: Not on file  . Stress: Not on file  Relationships  . Social connections:    Talks on phone: Not on file    Gets together: Not on file    Attends religious service: Not on file    Active member of club or organization: Not on file    Attends meetings of clubs or organizations: Not on file    Relationship status: Not on file  Other Topics Concern  . Not on file  Social History Narrative  . Not on file    Vitals:   04/20/18 0834  BP: 126/84  Pulse: 62  Resp: 12  Temp: 97.9 F (36.6 C)  SpO2: 97%   Body mass index is 29.15 kg/m.   Physical Exam  Nursing note and vitals reviewed. Constitutional: She is oriented to person, place, and time. She appears well-developed. No distress.  HENT:  Head: Normocephalic and atraumatic.  Mouth/Throat: Oropharynx is clear and moist and mucous membranes are normal. Abnormal dentition.  Eyes: Pupils are equal, round, and reactive to light. Conjunctivae are normal.   Cardiovascular: Normal rate and regular rhythm.  Murmur (?  Soft SEM RUSB) heard. Pulses:      Dorsalis pedis pulses are 2+ on the right side and 2+ on the left side.  Respiratory: Effort normal and breath sounds normal. No respiratory distress.  GI: Soft. She exhibits no mass. There is no hepatomegaly. There is no abdominal tenderness.  Musculoskeletal:        General: No edema.     Comments: Fallen and Tinel  negative bilateral.   Lymphadenopathy:    She has no cervical adenopathy.  Neurological: She is alert and oriented to person, place, and time. She has normal strength. No cranial nerve deficit. Gait normal.  Skin: Skin is warm. No rash noted. No erythema.  Psychiatric: She  has a normal mood and affect.  Well groomed, good eye contact.      ASSESSMENT AND PLAN:   Ms.Aleenah was seen today for numbness.  Diagnoses and all orders for this visit:  Diabetic peripheral neuropathy associated with type 2 diabetes mellitus ([place]) We discussed diagnosis, prognosis, and treatment options. Strongly recommend been compliant with medication and dietary recommendations. Better DM 2 control may prevent progression. Appropriate foot care.  She will continue following with Dr. Loanne Drilling for DM2.  Bilateral finger numbness We discussed possible etiologies, this could also be related to diabetic neuropathy. ?  Carpal tunnel syndrome. On examination today there is not neurologic deficit. EMG will be arranged. Instructed about warning signs.  Alternating constipation and diarrhea ?  IBM. Examination today do not suggest a serious process. Colonoscopy recommend adequate fiber and water intake. Benefiber 1 teaspoon twice daily may help. Daily Metamucil may help.    Return in about 3 months (around 07/20/2018).     Rooney Gladwin G. [name], MD  [facility]. [place] office.

## 2018-04-20 NOTE — Assessment & Plan Note (Signed)
Educated about diagnosis, prognosis, and treatment options. We discussed possible complications. Strongly recommend he better DM control. Foot care discussed in detail. Instructed about warning signs.

## 2018-04-20 NOTE — Patient Instructions (Signed)
A few things to remember from today's visit:   Diabetic peripheral neuropathy associated with type 2 diabetes mellitus (H[facility])  Bilateral finger numbness  Alternating constipation and diarrhea Benafiber 1 teaspoon twice daily. Adequate hydration. Over-the-counter Metamucil daily as needed.  Somebody will call you with information about appointment for electromyography.  In regard to peripheral neuropathy, the best treatment is to improve blood sugar control. Strongly recommend following recommendations given by nutritionist.  Please be sure medication list is accurate. If a new problem present, please set up appointment sooner than planned today.

## 2018-04-23 ENCOUNTER — Encounter: Payer: Self-pay | Admitting: Family Medicine

## 2018-05-10 ENCOUNTER — Telehealth: Payer: Self-pay

## 2018-05-10 NOTE — Telephone Encounter (Signed)
Copied from [place] [id number]. Topic: General - Other >> May 10, 2018  8:16 AM Leward Quan A wrote: Reason for CRM: Patient called to say that she had not received her insulin (Insulin Glargine (LANTUS) 100 UNIT/ML Solostar Pen)  which is normally delivered to the office. Upon contacting the company they informed her that the paper work is incomplete and she want to know if its still there so she can come in and complete the section needed so it can be faxed off to the company. Please contact the patient to let her know if paperwork is there so she can come in tomorrow and do her part. Thank you Ph# [phone]

## 2018-05-11 NOTE — Telephone Encounter (Signed)
Form faxed with confirmation.

## 2018-05-16 ENCOUNTER — Encounter: Payer: Self-pay | Admitting: Registered"

## 2018-05-16 ENCOUNTER — Encounter: Payer: BC Managed Care – PPO | Attending: Endocrinology | Admitting: Registered"

## 2018-05-16 DIAGNOSIS — E119 Type 2 diabetes mellitus without complications: Secondary | ICD-10-CM | POA: Insufficient documentation

## 2018-05-16 DIAGNOSIS — Z713 Dietary counseling and surveillance: Secondary | ICD-10-CM | POA: Diagnosis present

## 2018-05-16 NOTE — Patient Instructions (Addendum)
Goals:  Follow Diabetes Meal Plan as instructed  Eat 3 meals  Aim for carbohydrate intake of 45-60 grams carbohydrate/meal  Aim for carbohydrate intake of 15 grams carbohydrate/snack  Add lean protein foods to meals/snacks  Monitor glucose levels as instructed by your doctor  Aim for 10 mins of physical activity taking stairs at work, every other day  Bring food record and glucose log to your next nutrition visit  Read nutrition facts labels for carbohydrate content  Aim to have 1/2 plate non-starchy vegetables with lunch and dinner

## 2018-05-16 NOTE — Progress Notes (Signed)
Diabetes Self-Management Education  Visit Type:  Follow-up  Appt. Start Time: 8:00 Appt. End Time: 8:53  05/16/2018  [name], identified by name and date of birth, is a 58 y.o. female with a diagnosis of Diabetes: Type 2.   ASSESSMENT  Pt states she has lost weight since her previous visit. Pt states she has been checking her BS 2-3 times/day: FBS (~200-300), after lunch (~200) and dinner (~100). Pt states she binged over the holidays. Pt states she has not been watching carbohydrate intake.   RD educated patient on the importance of carbohydrate intake and having balance throughout the day. Pt was educated on how to read and interpret carbohydrate content for meals at home and when eating out. Pt was educated on carbohydrate counting and giving recommendations for each meal. Pt was also encouraged to implement intentional physical activity into her day to help with insulin sensitivity.     There were no vitals taken for this visit. There is no height or weight on file to calculate BMI.   Diabetes Self-Management Education - 05/16/18 0805      Health Coping   How would you rate your overall health?  Fair      Psychosocial Assessment   Patient Belief/Attitude about Diabetes  Motivated to manage diabetes    Self-care barriers  None    Patient Concerns  Nutrition/Meal planning    Special Needs  None    Preferred Learning Style  No preference indicated    Learning Readiness  Ready      Complications   How often do you check your blood sugar?  3-4 times/day    Fasting Blood glucose range (mg/dL)  180-200    Postprandial Blood glucose range (mg/dL)  180-200    Number of hypoglycemic episodes per month  2    Can you tell when your blood sugar is low?  Yes    What do you do if your blood sugar is low?  drinks orange juice    Number of hyperglycemic episodes per week  0    Have you had a dilated eye exam in the past 12 months?  Yes    Have you had a dental exam in the  past 12 months?  No    Are you checking your feet?  Yes    How many days per week are you checking your feet?  7      Dietary Intake   Breakfast  grits + eggs + [place] bacon + water    Lunch  fast food-burger combo with fries and strawberry drink + water    Snack (afternoon)  sometimes 1.5 slices of cheese pizza + 2 cookies + orange drink + water    Dinner  fried chicken wing + water    Beverage(s)  water, orange dirnk, strawberry drink      Exercise   Exercise Type  ADL's   takes stairs at work   How many days per week to you exercise?  0    How many minutes per day do you exercise?  0    Total minutes per week of exercise  0      Patient Education   Previous Diabetes Education  Yes (please comment)    Disease state   Definition of diabetes, type 1 and 2, and the diagnosis of diabetes;Factors that contribute to the development of diabetes    Nutrition management   Food label reading, portion sizes and measuring food.;Carbohydrate counting;Reviewed blood  glucose goals for pre and post meals and how to evaluate the patients' food intake on their blood glucose level.;Information on hints to eating out and maintain blood glucose control.    Physical activity and exercise   Role of exercise on diabetes management, blood pressure control and cardiac health.;Helped patient identify appropriate exercises in relation to his/her diabetes, diabetes complications and other health issue.    Monitoring  Purpose and frequency of SMBG.;Taught/discussed recording of test results and interpretation of SMBG.;Daily foot exams;Yearly dilated eye exam    Acute complications  Taught treatment of hypoglycemia - the 15 rule.;Discussed and identified patients' treatment of hyperglycemia.    Personal strategies to promote health  Lifestyle issues that need to be addressed for better diabetes care      Individualized Goals (developed by patient)   Nutrition  General guidelines for healthy choices and portions  discussed;Adjust meds/carbs with exercise as discussed    Physical Activity  Exercise 3-5 times per week    Medications  take my medication as prescribed    Monitoring   test my blood glucose as discussed    Reducing Risk  examine blood glucose patterns;treat hypoglycemia with 15 grams of carbs if blood glucose less than 70mg /dL      Post-Education Assessment   Patient understands the diabetes disease and treatment process.  Demonstrates understanding / competency    Patient understands incorporating nutritional management into lifestyle.  Demonstrates understanding / competency    Patient undertands incorporating physical activity into lifestyle.  Demonstrates understanding / competency    Patient understands using medications safely.  Demonstrates understanding / competency    Patient understands monitoring blood glucose, interpreting and using results  Demonstrates understanding / competency    Patient understands prevention, detection, and treatment of acute complications.  Demonstrates understanding / competency    Patient understands prevention, detection, and treatment of chronic complications.  Needs Instruction    Patient understands how to develop strategies to address psychosocial issues.  Needs Review    Patient understands how to develop strategies to promote health/change behavior.  Needs Review      Outcomes   Program Status  Not Completed      Subsequent Visit   Since your last visit have you continued or begun to take your medications as prescribed?  Yes    Since your last visit have you had your blood pressure checked?  Yes    Is your most recent blood pressure lower, unchanged, or higher since your last visit?  Unchanged    Since your last visit have you experienced any weight changes?  Loss    Weight Loss (lbs)  5    Since your last visit, are you checking your blood glucose at least once a day?  Yes       Learning Objective:  Patient will have a greater  understanding of diabetes self-management. Patient education plan is to attend individual and/or group sessions per assessed needs and concerns.   Plan:   Patient Instructions  Goals:  Follow Diabetes Meal Plan as instructed  Eat 3 meals  Aim for carbohydrate intake of 45-60 grams carbohydrate/meal  Aim for carbohydrate intake of 15 grams carbohydrate/snack  Add lean protein foods to meals/snacks  Monitor glucose levels as instructed by your doctor  Aim for 10 mins of physical activity taking stairs at work, every other day  Bring food record and glucose log to your next nutrition visit  Read nutrition facts labels for carbohydrate content  Aim  to have 1/2 plate non-starchy vegetables with lunch and dinner      Expected Outcomes:  Demonstrated interest in learning. Expect positive outcomes  Education material provided: Carbohydrate counting sheet  If problems or questions, patient to contact team via:  Phone and Email  Future DSME appointment: - 4-6 wks

## 2018-05-17 ENCOUNTER — Other Ambulatory Visit: Payer: Self-pay | Admitting: Internal Medicine

## 2018-05-17 NOTE — Telephone Encounter (Signed)
Pt called in to follow up on request stating that she is completely out of her medication. Advised pt of medication turn around time. Pt expressed understanding.

## 2018-05-17 NOTE — Telephone Encounter (Signed)
Requested medication (s) are due for refill today: yes  Requested medication (s) are on the active medication list: yes  Last refill:  05/05/17 historical provider  Future visit scheduled: yes  Notes to clinic:  Pt states totally out of medication    Requested Prescriptions  Pending Prescriptions Disp Refills   chlorthalidone (HYGROTON) 25 MG tablet [Pharmacy Med Name: Chlorthalidone 25 MG Oral Tablet] 90 tablet 0    Sig: TAKE 1 TABLET BY MOUTH ONCE DAILY     Cardiovascular: Diuretics - Thiazide Failed - 05/17/2018  9:16 AM      Failed - Ca in normal range and within 360 days    Calcium  Date Value Ref Range Status  11/19/2016 9.4 8.9 - 10.3 mg/dL Final         Failed - Cr in normal range and within 360 days    Creatinine, Ser  Date Value Ref Range Status  11/19/2016 0.74 0.44 - 1.00 mg/dL Final         Failed - K in normal range and within 360 days    Potassium  Date Value Ref Range Status  11/19/2016 3.9 3.5 - 5.1 mmol/L Final         Failed - Na in normal range and within 360 days    Sodium  Date Value Ref Range Status  11/19/2016 136 135 - 145 mmol/L Final         Passed - Last BP in normal range    BP Readings from Last 1 Encounters:  04/20/18 126/84         Passed - Valid encounter within last 6 months    Recent Outpatient Visits          3 weeks ago Diabetic peripheral neuropathy associated with type 2 diabetes mellitus ([place])   [facility] [name], Malka So, MD   1 month ago Bilateral hand numbness   [facility] at [facility] [name], Malka So, MD   2 months ago DM (diabetes mellitus), type 2 with peripheral vascular complications ([place])   [place] at [facility] [name], Malka So, MD   1 year ago Essential hypertension   Therapist, [facility] at [facility], Doretha Sou, MD   1 year ago Essential hypertension   [facility]rap[facility] at [facility], Doretha Sou, MD      Future Appointments            In 2  months [name], Malka So, MD [place] at [place], [place]          FLUoxetine (PROZAC) 20 MG capsule [Pharmacy Med Name: FLUoxetine HCl 20 MG Oral Capsule] 30 capsule 0    Sig: TAKE 1 CAPSULE BY MOUTH ONCE DAILY     Psychiatry:  Antidepressants - SSRI Passed - 05/17/2018  9:16 AM      Passed - Valid encounter within last 6 months    Recent Outpatient Visits          3 weeks ago Diabetic peripheral neuropathy associated with type 2 diabetes mellitus ([place])   [place] at [name], Malka So, MD   1 month ago Bilateral hand numbness   Therapist[facility] at [facility] [name], Malka So, MD   2 months ago DM (diabetes mellitus), type 2 with peripheral vascular complications ([place])   [place] at [name], Malka So, MD   1 year ago Essential hypertension   [place] at [place], Doretha Sou, MD   1 year ago Essential hypertension   [place]  [facility], Doretha Sou, MD      Future Appointments            In 2 months [name], Malka So, MD [facility] at [place], [place]          pravastatin (PRAVACHOL) 40 MG tablet [Pharmacy Med Name: Pravastatin Sodium 40 MG Oral Tablet] 90 tablet 0    Sig: TAKE 1 TABLET BY MOUTH ONCE DAILY     Cardiovascular:  Antilipid - Statins Failed - 05/17/2018  9:16 AM      Failed - Total Cholesterol in normal range and within 360 days    Cholesterol  Date Value Ref Range Status  02/26/2018 227 (H) 0 - 200 mg/dL Final    Comment:    ATP III Classification       Desirable:  < 200 mg/dL               Borderline High:  200 - 239 mg/dL          High:  > = 240 mg/dL         Failed - LDL in normal range and within 360 days    LDL Cholesterol  Date Value Ref Range Status  02/26/2018 158 (H) 0 - 99 mg/dL Final         Passed - HDL in normal range and within 360 days    HDL  Date Value Ref Range Status  02/26/2018 42.00 >39.00 mg/dL Final         Passed - Triglycerides in  normal range and within 360 days    Triglycerides  Date Value Ref Range Status  02/26/2018 132.0 0.0 - 149.0 mg/dL Final    Comment:    Normal:  <150 mg/dLBorderline High:  150 - 199 mg/dL   Triglyceride fasting, serum  Date Value Ref Range Status  04/28/2006 72 0 - 149 mg/dL Final    Comment:    See lab report for associated comment(s)         Passed - Patient is not pregnant      Passed - Valid encounter within last 12 months    Recent Outpatient Visits          3 weeks ago Diabetic peripheral neuropathy associated with type 2 diabetes mellitus ([place])   Therapist[facility] at [facility] [name], Malka So, MD   1 month ago Bilateral hand numbness   Therapist[facility] at [facility] [name], Malka So, MD   2 months ago DM (diabetes mellitus), type 2 with peripheral vascular complications ([place])   [place] at [name], Malka So, MD   1 year ago Essential hypertension   Therapist, music at [place], Doretha Sou, MD   1 year ago Essential hypertension   Therapist, music at [facility], Doretha Sou, MD      Future Appointments            In 2 months [name], Malka So, MD [employer] at [place], [facility]

## 2018-05-25 ENCOUNTER — Telehealth: Payer: Self-pay | Admitting: *Deleted

## 2018-05-25 NOTE — Telephone Encounter (Signed)
° ° ° °   this has to be put in as a referral not an order need to be corrected . Once its done I will send to [facility] can not schedule off an order per [facility]  Need to be put in as a referral    Copied from [place] [phone]. Topic: General - Other >> May 25, 2018  8:55 AM Bea Graff, NT wrote: Reason for CRM: Pt calling to check status on getting an appt for an electromyography that was ordered in November? Pt states she was without a phone for awhile but now has one back but may have missed the call. Please advise. CB#: [phone]

## 2018-05-25 NOTE — Telephone Encounter (Signed)
Message sent to Dr. Jordan for review and approval. 

## 2018-05-25 NOTE — Telephone Encounter (Signed)
Left detailed message informing patient that her insulin was delivered to the clinic and ready for pick-up.

## 2018-05-28 NOTE — Telephone Encounter (Signed)
I did place EMG order in 03/2018, order is still active. Can you please asked Neoma Laming about status. Thanks, BJ

## 2018-05-29 ENCOUNTER — Other Ambulatory Visit: Payer: Self-pay | Admitting: *Deleted

## 2018-05-29 DIAGNOSIS — R200 Anesthesia of skin: Secondary | ICD-10-CM

## 2018-05-29 NOTE — Telephone Encounter (Signed)
Referral placed for EMG.

## 2018-05-30 ENCOUNTER — Encounter: Payer: Self-pay | Admitting: Neurology

## 2018-06-13 ENCOUNTER — Encounter: Payer: BC Managed Care – PPO | Attending: Endocrinology | Admitting: Registered"

## 2018-06-13 ENCOUNTER — Encounter: Payer: Self-pay | Admitting: Registered"

## 2018-06-13 DIAGNOSIS — E119 Type 2 diabetes mellitus without complications: Secondary | ICD-10-CM | POA: Insufficient documentation

## 2018-06-13 DIAGNOSIS — Z713 Dietary counseling and surveillance: Secondary | ICD-10-CM | POA: Insufficient documentation

## 2018-06-13 NOTE — Progress Notes (Signed)
Diabetes Self-Management Education  Visit Type:     Appt. Start Time: 8:05 Appt. End Time: 8:35  06/13/2018  [name], identified by name and date of birth, is a 58 y.o. female with a diagnosis of Diabetes:  .   ASSESSMENT  Pt states she is running low on finances, checking BS every day: FBS (180-350 ). Pt reports logging BS numbers but not food intake. Pt states she has has been boiling eggs instead of frying them as well as using wheat bread instead of white bread. Pt reports physical activity at work and home, walking and taking the stairs; works as Sports coach at [employer].   Next visit: review meals and BS numbers; educate chronic complications related to diabetes  There were no vitals taken for this visit. There is no height or weight on file to calculate BMI.   Diabetes Self-Management Education - 62/69/48 5462      Complications   How often do you check your blood sugar?  --   every other day   Fasting Blood glucose range (mg/dL)  180-200;>200    Number of hypoglycemic episodes per month  2    Can you tell when your blood sugar is low?  Yes    What do you do if your blood sugar is low?  drinks orange juice      Dietary Intake   Breakfast  grits + eggs + pieces of bacon    Lunch  steak and cheese sandwich + water or salad + chicken + dressing or collard greens + baked chicken + rice    Snack (afternoon)  sometimes chips    Dinner  fast food-burger + fries + orange hi-c    Beverage(s)  water, Hi-C      Exercise   Exercise Type  ADL's      Patient Education   Previous Diabetes Education  Yes (please comment)    Disease state   Definition of diabetes, type 1 and 2, and the diagnosis of diabetes;Factors that contribute to the development of diabetes    Personal strategies to promote health  Lifestyle issues that need to be addressed for better diabetes care      Patient Self-Evaluation of Goals - Patient rates self as meeting previously set goals (% of  time)   Nutrition  < 25%    Physical Activity  25 - 50%    Medications  25 - 50%    Monitoring  >75%      Post-Education Assessment   Patient understands the diabetes disease and treatment process.  Demonstrates understanding / competency    Patient understands incorporating nutritional management into lifestyle.  Needs Review    Patient undertands incorporating physical activity into lifestyle.  Demonstrates understanding / competency    Patient understands using medications safely.  Demonstrates understanding / competency    Patient understands monitoring blood glucose, interpreting and using results  Needs Review    Patient understands prevention, detection, and treatment of acute complications.  Demonstrates understanding / competency    Patient understands prevention, detection, and treatment of chronic complications.  Needs Instruction    Patient understands how to develop strategies to address psychosocial issues.  Needs Review    Patient understands how to develop strategies to promote health/change behavior.  Needs Review      Outcomes   Program Status  Not Completed       Learning Objective:  Patient will have a greater understanding of diabetes self-management. Patient education plan  is to attend individual and/or group sessions per assessed needs and concerns.   Plan:   Patient Instructions  - Aim to use My Plate as guide for meals. Include protein, carbohydrates, and non-starchy vegetables for lunch and dinner.   - Have half your plate as non-starchy vegetables.   - Try flavor packs with water instead Hi-C or other sugar-sweetened beverages especially when eating out.   - Record food intake and blood sugars number. Keep a log.     Expected Outcomes:  Demonstrated interest in learning. Expect positive outcomes  Education material provided: none  If problems or questions, patient to contact team via:  Phone and Email  Future DSME appointment: - 4-6 wks

## 2018-06-13 NOTE — Patient Instructions (Addendum)
-   Aim to use My Plate as guide for meals. Include protein, carbohydrates, and non-starchy vegetables for lunch and dinner.   - Have half your plate as non-starchy vegetables.   - Try flavor packs with water instead Hi-C or other sugar-sweetened beverages especially when eating out.   - Record food intake and blood sugars number. Keep a log.

## 2018-06-14 ENCOUNTER — Ambulatory Visit (INDEPENDENT_AMBULATORY_CARE_PROVIDER_SITE_OTHER): Payer: BC Managed Care – PPO | Admitting: Neurology

## 2018-06-14 DIAGNOSIS — R200 Anesthesia of skin: Secondary | ICD-10-CM

## 2018-06-14 DIAGNOSIS — E1142 Type 2 diabetes mellitus with diabetic polyneuropathy: Secondary | ICD-10-CM

## 2018-06-14 NOTE — Procedures (Signed)
[facility]  [address]  [place], [place] [address] Tel: [phone] Fax:  [phone] Test Date:  06/14/2018  Patient: [name] DOB: 1960-12-20 Physician: Narda Amber, DO  Sex: Female Height: 5\' 4"  Ref Phys: Betty [name], MD  ID#: [id number] Temp: 34.0C Technician:    Patient Complaints: This is a 58 year old female with poorly controlled diabetes mellitus referred for evaluation of bilateral hand paresthesias and weakness.  NCV & EMG Findings: Extensive electrodiagnostic testing of the right upper extremity and additional studies of the left shows:  1. Bilateral median sensory responses are absent.  Bilateral radial and ulnar sensory responses show reduced amplitude (R13.6, L13.3, R8.8, L3.8 V), with the left ulnar nerve also with prolonged latency (3.7 ms). 2. Bilateral median motor responses show prolonged latency (4.7, L4.9 ms) and reduced amplitude (R5.4, L5.8 mV).  Bilateral ulnar motor responses show reduced amplitude (R5.5, L4.9 mV), slowed conduction velocity (26 - 48 m/s), and mild latency prolongation on the left (3.3 ms). 3. Chronic motor axonal loss changes are seen affecting the distal hand muscles, without accompanied active denervation.  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  Overall, these findings are moderate in degree electrically.   ___________________________ Narda Amber, DO    Nerve Conduction Studies Anti Sensory Summary Table   Site NR Peak (ms) Norm Peak (ms) P-T Amp (V) Norm P-T Amp  Left Median Anti Sensory (2nd Digit)  34C  Wrist NR  <3.6  >15  Right Median Anti Sensory (2nd Digit)  34C  Wrist NR  <3.6  >15  Left Radial Anti Sensory (Base 1st Digit)  34C  Wrist    2.6 <2.7 13.3 >14  Right Radial Anti Sensory (Base 1st Digit)  34C  Wrist    2.4 <2.7 13.6 >14  Left Ulnar Anti Sensory (5th Digit)  34C  Wrist     3.7 <3.1 3.8 >10  Right Ulnar Anti Sensory (5th Digit)  34C  Wrist    3.1 <3.1 8.8 >10   Motor Summary Table   Site NR Onset (ms) Norm Onset (ms) O-P Amp (mV) Norm O-P Amp Site1 Site2 Delta-0 (ms) Dist (cm) Vel (m/s) Norm Vel (m/s)  Left Median Motor (Abd Poll Brev)  34C  Wrist    4.9 <4.0 5.8 >6 Elbow Wrist 6.3 29.0 46 >50  Elbow    11.2  5.0         Right Median Motor (Abd Poll Brev)  34C  Wrist    4.7 <4.0 5.4 >6 Elbow Wrist 7.0 29.0 41 >50  Elbow    11.7  5.1         Left Ulnar Motor (Abd Dig Minimi)  34C  Wrist    3.3 <3.1 4.9 >7 B Elbow Wrist 5.0 24.0 48 >50  B Elbow    8.3  3.7  A Elbow B Elbow 3.8 10.0 26 >50  A Elbow    12.1  3.2         Right Ulnar Motor (Abd Dig Minimi)  34C  Wrist    2.7 <3.1 5.5 >7 B Elbow Wrist 4.7 24.0 51 >50  B Elbow    7.4  4.6  A Elbow B Elbow 2.6 10.0 38 >50  A Elbow    10.0  4.4          EMG   Side Muscle Ins Act Fibs Psw Fasc Number Recrt Dur Dur. Amp Amp. Poly Poly.  Comment  Right 1stDorInt Nml Nml Nml Nml 1- Rapid Some 1+ Some 1+ Nml Nml N/A  Right Biceps Nml Nml Nml Nml Nml Nml Nml Nml Nml Nml Nml Nml N/A  Right Abd Poll Brev Nml Nml Nml Nml 1- Rapid Some 1+ Some 1+ Nml Nml N/A  Right Ext Indicis Nml Nml Nml Nml 1- Rapid Some 1+ Some 1+ Nml Nml N/A  Right PronatorTeres Nml Nml Nml Nml Nml Nml Nml Nml Nml Nml Nml Nml N/A  Right Deltoid Nml Nml Nml Nml Nml Nml Nml Nml Nml Nml Nml Nml N/A  Right Triceps Nml Nml Nml Nml Nml Nml Nml Nml Nml Nml Nml Nml N/A  Left Biceps Nml Nml Nml Nml Nml Nml Nml Nml Nml Nml Nml Nml N/A  Left Abd Poll Brev Nml Nml Nml Nml 1- Rapid Some 1+ Some 1+ Nml Nml N/A  Left 1stDorInt Nml Nml Nml Nml 1- Rapid Some 1+ Some 1+ Nml Nml N/A  Left PronatorTeres Nml Nml Nml Nml Nml Nml Nml Nml Nml Nml Nml Nml N/A  Left Ext Indicis Nml Nml Nml Nml 1- Rapid Some 1+ Some 1+ Nml Nml N/A  Left Triceps Nml Nml Nml Nml Nml Nml Nml Nml Nml Nml Nml Nml N/A  Left Deltoid Nml Nml Nml Nml Nml Nml Nml Nml Nml Nml Nml Nml N/A  Left  ABD Dig Min Nml Nml Nml Nml 1- Rapid Some 1+ Some 1+ Nml Nml N/A  Right ABD Dig Min Nml Nml Nml Nml 1- Rapid Some 1+ Some 1+ Nml Nml N/A      Waveforms:

## 2018-06-19 ENCOUNTER — Other Ambulatory Visit: Payer: Self-pay | Admitting: *Deleted

## 2018-06-19 ENCOUNTER — Telehealth: Payer: Self-pay

## 2018-06-19 ENCOUNTER — Telehealth: Payer: Self-pay | Admitting: *Deleted

## 2018-06-19 DIAGNOSIS — R94131 Abnormal electromyogram [EMG]: Secondary | ICD-10-CM

## 2018-06-19 NOTE — Telephone Encounter (Signed)
Referral placed to [facility] for follow-up for EMG abnormalities per Dr. [name].

## 2018-06-19 NOTE — Telephone Encounter (Signed)
Spoke with patient and gave results of EMG and recommendations per Dr. [name]. Patient verbalized understanding.  Copied from [place] [phone]. Topic: Quick Communication - Lab Results (Clinic Use ONLY) >> Jun 18, 2018 10:47 AM Zacarias Pontes, CMA wrote: Called patient to inform them of their lab results. When patient returns call, triage nurse may disclose results. >> Jun 19, 2018  9:36 AM Carolyn Stare wrote:   Pt said she received a call and the TE said lab results, pt said she has not had any labs and not sure about the call

## 2018-06-19 NOTE — Telephone Encounter (Signed)
Copied from [place] [id number]. Topic: General - Other >> Jun 19, 2018  2:23 PM Yvette Rack wrote: Reason for CRM: Theadora Rama from Dr Posey Pronto office at [facility] [phone] calling stating that pt called them to make a f/u visit with Posey Pronto per Dr [name] Brandy states that if that id true that pt would need another referral for a f/u visit from the EMG test that was done on 06-14-18 they made an appt for pt on 08-22-18 please fax referral to [phone]

## 2018-07-12 ENCOUNTER — Ambulatory Visit: Payer: BC Managed Care – PPO | Admitting: Registered"

## 2018-07-20 ENCOUNTER — Ambulatory Visit (INDEPENDENT_AMBULATORY_CARE_PROVIDER_SITE_OTHER): Payer: BC Managed Care – PPO | Admitting: Family Medicine

## 2018-07-20 ENCOUNTER — Other Ambulatory Visit: Payer: BC Managed Care – PPO

## 2018-07-20 ENCOUNTER — Other Ambulatory Visit: Payer: Self-pay

## 2018-07-20 ENCOUNTER — Encounter: Payer: Self-pay | Admitting: Family Medicine

## 2018-07-20 VITALS — BP 120/80 | HR 70 | Temp 97.7°F | Resp 12 | Ht 64.5 in | Wt 182.4 lb

## 2018-07-20 DIAGNOSIS — I10 Essential (primary) hypertension: Secondary | ICD-10-CM | POA: Diagnosis not present

## 2018-07-20 DIAGNOSIS — E1142 Type 2 diabetes mellitus with diabetic polyneuropathy: Secondary | ICD-10-CM | POA: Diagnosis not present

## 2018-07-20 DIAGNOSIS — E785 Hyperlipidemia, unspecified: Secondary | ICD-10-CM

## 2018-07-20 DIAGNOSIS — E1169 Type 2 diabetes mellitus with other specified complication: Secondary | ICD-10-CM | POA: Diagnosis not present

## 2018-07-20 DIAGNOSIS — E1151 Type 2 diabetes mellitus with diabetic peripheral angiopathy without gangrene: Secondary | ICD-10-CM

## 2018-07-20 DIAGNOSIS — F419 Anxiety disorder, unspecified: Secondary | ICD-10-CM

## 2018-07-20 LAB — LIPID PANEL
Cholesterol: 213 mg/dL — ABNORMAL HIGH (ref 0–200)
HDL: 39.3 mg/dL (ref >39.00)
LDL Cholesterol: 146 mg/dL — ABNORMAL HIGH (ref 0–99)
NonHDL: 173.43
Total CHOL/HDL Ratio: 5
Triglycerides: 139 mg/dL (ref 0.0–149.0)
VLDL: 27.8 mg/dL (ref 0.0–40.0)

## 2018-07-20 LAB — COMPREHENSIVE METABOLIC PANEL
ALK PHOS: 99 U/L (ref 39–117)
ALT: 23 U/L (ref 0–35)
AST: 17 U/L (ref 0–37)
Albumin: 3.7 g/dL (ref 3.5–5.2)
BUN: 19 mg/dL (ref 6–23)
CO2: 31 mEq/L (ref 19–32)
Calcium: 9.6 mg/dL (ref 8.4–10.5)
Chloride: 99 mEq/L (ref 96–112)
Creatinine, Ser: 0.74 mg/dL (ref 0.40–1.20)
GFR: 97.13 mL/min (ref >60.00)
Glucose, Bld: 350 mg/dL — ABNORMAL HIGH (ref 70–99)
Potassium: 4.3 mEq/L (ref 3.5–5.1)
Sodium: 136 mEq/L (ref 135–145)
TOTAL PROTEIN: 7.1 g/dL (ref 6.0–8.3)
Total Bilirubin: 0.4 mg/dL (ref 0.2–1.2)

## 2018-07-20 MED ORDER — GABAPENTIN 100 MG PO CAPS: 100.0000 mg | ORAL_CAPSULE | Freq: Three times a day (TID) | ORAL | 1 refills | Status: DC

## 2018-07-20 NOTE — Assessment & Plan Note (Signed)
She is not sure about which medication she is taking. Recommend continuing with atorvastatin 40 mg daily. Low-fat diet. Further recommendation will be given according to lipid panel results.

## 2018-07-20 NOTE — Patient Instructions (Addendum)
A few things to remember from today's visit:   Diabetic peripheral neuropathy associated with type 2 diabetes mellitus ([place]) - Plan: gabapentin (NEURONTIN) 100 MG capsule  DM (diabetes mellitus), type 2 with peripheral vascular complications ([place]) - Plan: Hemoglobin A1c, Comprehensive metabolic panel  Essential hypertension - Plan: Comprehensive metabolic panel  Hyperlipidemia associated with type 2 diabetes mellitus ([place]) - Plan: Comprehensive metabolic panel, Lipid panel  No changes in your diabetes medication today, be sure to call your endocrinologist office to arrange follow-up appointment. Today gabapentin 100 mg was started, you can start taking it at night, in 10 days at 1 in the morning, and over 10 days at another 1 at noon. Keep next appointment with neurologist.  Please be sure medication list is accurate. If a new problem present, please set up appointment sooner than planned today.

## 2018-07-20 NOTE — Assessment & Plan Note (Signed)
Adequately controlled. No changes in current management. DASH-low diet recommended. Eye exam recommended annually.

## 2018-07-20 NOTE — Assessment & Plan Note (Signed)
After discussion of some side effects, she agrees with trying gabapentin 100 mg. Instructed to start gabapentin 100 mg at night and titrate up to 3 times daily as tolerated. She has an appointment with neurologist next month, strongly recommend keeping it. Appropriate foot care.

## 2018-07-20 NOTE — Progress Notes (Signed)
HPI:   [name] is a 58 y.o. female, who is here today for chronic illness management.  She was last seen on 04/20/2018.  Since last visit she has seen neurologist. She is still complaining of upper extremity numbness and tingling,R>L.  Hyperlipidemia: She is not sure if she is taking atorvastatin or pravastatin.  She is tolerating medication well. She is trying to follow a low-fat diet.  Lab Results  Component Value Date   CHOL 227 (H) 02/26/2018   HDL 42.00 02/26/2018   LDLCALC 158 (H) 02/26/2018   LDLDIRECT 175.9 03/02/2012   TRIG 132.0 02/26/2018   CHOLHDL 5 02/26/2018   Hypertension: Hx of CVD. Currently she is on lisinopril-HCTZ 20-25 mg daily and amlodipine 10 mg daily. Denies severe/frequent headache, visual changes, chest pain, dyspnea, palpitation, focal weakness, or edema.  DM II: Taking Lantus 120 units in the morning. She is following with endocrinologist, she states that she is not sure about follow-up appointment. Still having "strange" feet sensation.  Lab Results  Component Value Date   HGBA1C >14.0 (H) 02/26/2018   Lab Results  Component Value Date   CREATININE 0.74 11/19/2016   BUN 16 11/19/2016   NA 136 11/19/2016   K 3.9 11/19/2016   CL 101 11/19/2016   CO2 27 11/19/2016   Lab Results  Component Value Date   MICROALBUR 3.8 (H) 02/26/2018   Currently she is on Prozac 20 mg daily, she is not sure why she is taking this medication. She denies depressed mood or suicidal thoughts.   Tolerating medication well.   Review of Systems  Constitutional: Negative for activity change, appetite change, fatigue and fever.  HENT: Negative for mouth sores, nosebleeds and trouble swallowing.   Eyes: Negative for redness and visual disturbance.  Respiratory: Negative for cough, shortness of breath and wheezing.   Cardiovascular: Negative for chest pain, palpitations and leg swelling.  Gastrointestinal: Negative for abdominal pain,  nausea and vomiting.       Negative for changes in bowel habits.  Genitourinary: Negative for decreased urine volume and hematuria.  Musculoskeletal: Negative for gait problem and myalgias.  Skin: Negative for rash and wound.  Neurological: Negative for syncope, weakness and headaches.  Psychiatric/Behavioral: Negative for confusion. The patient is nervous/anxious.     Current Outpatient Medications on File Prior to Visit  Medication Sig Dispense Refill  . amLODipine (NORVASC) 10 MG tablet Take 1 tablet (10 mg total) by mouth daily. 90 tablet 3  . atorvastatin (LIPITOR) 40 MG tablet Take 1 tablet (40 mg total) by mouth daily. 90 tablet 3  . chlorthalidone (HYGROTON) 25 MG tablet TAKE 1 TABLET BY MOUTH ONCE DAILY 90 tablet 0  . clopidogrel (PLAVIX) 75 MG tablet Take 1 tablet (75 mg total) by mouth daily. 90 tablet 3  . FLUoxetine (PROZAC) 20 MG capsule TAKE 1 CAPSULE BY MOUTH ONCE DAILY 90 capsule 0  . glucose blood (CONTOUR NEXT TEST) test strip 1 each by Other route 2 (two) times daily. And lancets 2/day 200 each 3  . glucose blood (ONETOUCH VERIO) test strip USE TO CHECK BLOOD SUGAR TWICE A DAY AND PRN 100 each 6  . Insulin Glargine (LANTUS) 100 UNIT/ML Solostar Pen Inject 120 Units into the skin every morning. And pen needles 1/day 15 pen 11  . lisinopril-hydrochlorothiazide (PRINZIDE,ZESTORETIC) 20-25 MG tablet Take 1 tablet by mouth daily. 90 tablet 3  . omeprazole (PRILOSEC) 20 MG capsule TAKE 1 CAPSULE BY MOUTH ONCE DAILY 30 capsule  0  . ONE TOUCH LANCETS MISC USE TO CHECK BLOOD SUGAR TWICE A DAY AND PRN 100 each 6  . pravastatin (PRAVACHOL) 40 MG tablet TAKE 1 TABLET BY MOUTH ONCE DAILY 90 tablet 0  . triamcinolone ointment (KENALOG) 0.5 % APPLY  TWICE DAILY 15 g 1   No current facility-administered medications on file prior to visit.      Past Medical History:  Diagnosis Date  . Abnormality of gait 05/10/2010  . BACK PAIN 11/14/2008  . CEREBROVASCULAR DISEASE 08/05/2008  .  DIABETES MELLITUS, TYPE II 07/15/2008  . Diplopia 07/15/2008  . ECZEMA, ATOPIC 04/03/2009  . Guillain-Barre ([place])   . HYPERLIPIDEMIA 03/06/2009  . HYPERTENSION 07/15/2008  . Stroke ([place]) 2010   x2   . Vertebral artery stenosis    No Known Allergies  Social History   Socioeconomic History  . Marital status: Single    Spouse name: Not on file  . Number of children: Not on file  . Years of education: Not on file  . Highest education level: Not on file  Occupational History  . Not on file  Social Needs  . Financial resource strain: Not on file  . Food insecurity:    Worry: Not on file    Inability: Not on file  . Transportation needs:    Medical: Not on file    Non-medical: Not on file  Tobacco Use  . Smoking status: Never Smoker  . Smokeless tobacco: Never Used  Substance and Sexual Activity  . Alcohol use: No  . Drug use: No  . Sexual activity: Not on file  Lifestyle  . Physical activity:    Days per week: Not on file    Minutes per session: Not on file  . Stress: Not on file  Relationships  . Social connections:    Talks on phone: Not on file    Gets together: Not on file    Attends religious service: Not on file    Active member of club or organization: Not on file    Attends meetings of clubs or organizations: Not on file    Relationship status: Not on file  Other Topics Concern  . Not on file  Social History Narrative  . Not on file    Vitals:   07/20/18 0831  BP: 120/80  Pulse: 70  Resp: 12  Temp: 97.7 F (36.5 C)  SpO2: 96%   Body mass index is 30.82 kg/m.   Physical Exam  Nursing note and vitals reviewed. Constitutional: She is oriented to person, place, and time. She appears well-developed. No distress.  HENT:  Head: Normocephalic and atraumatic.  Mouth/Throat: Oropharynx is clear and moist and mucous membranes are normal. Abnormal dentition.  Eyes: Pupils are equal, round, and reactive to light. Conjunctivae are normal.  Cardiovascular:  Normal rate and regular rhythm.  No murmur heard. Pulses:      Dorsalis pedis pulses are 2+ on the right side and 2+ on the left side.  Respiratory: Effort normal and breath sounds normal. No respiratory distress.  GI: Soft. She exhibits no mass. There is no hepatomegaly. There is no abdominal tenderness.  Musculoskeletal:        General: No edema.  Lymphadenopathy:    She has no cervical adenopathy.  Neurological: She is alert and oriented to person, place, and time. She has normal strength. No cranial nerve deficit. Gait normal.  Skin: Skin is warm. No rash noted. No erythema.  Psychiatric: She has a normal mood  and affect.  Well groomed, good eye contact.    ASSESSMENT AND PLAN:  Ms. Zanita was seen today for follow-up.   Orders Placed This Encounter  Procedures  . Hemoglobin A1c  . Comprehensive metabolic panel  . Lipid panel    Lab Results  Component Value Date   HGBA1C 13.6 (H) 07/20/2018   Lab Results  Component Value Date   ALT 23 07/20/2018   AST 17 07/20/2018   ALKPHOS 99 07/20/2018   BILITOT 0.4 07/20/2018   Lab Results  Component Value Date   CREATININE 0.74 07/20/2018   BUN 19 07/20/2018   NA 136 07/20/2018   K 4.3 07/20/2018   CL 99 07/20/2018   CO2 31 07/20/2018   Lab Results  Component Value Date   CHOL 213 (H) 07/20/2018   HDL 39.30 07/20/2018   LDLCALC 146 (H) 07/20/2018   LDLDIRECT 175.9 03/02/2012   TRIG 139.0 07/20/2018   CHOLHDL 5 07/20/2018     DM (diabetes mellitus), type 2 with peripheral vascular complications ([place]) DHR4B is at goal. No changes in current management,will adjust insulin according to A1C results. Regular exercise and healthy diet with avoidance of added sugar food intake is an important part of treatment and recommended. Annual eye exam,she is overdue. Periodic dental and foot care recommended. Recommend calling endocrinologist office to schedule follow-up appointment.   Essential hypertension Adequately  controlled. No changes in current management. DASH-low diet recommended. Eye exam recommended annually.    Diabetic peripheral neuropathy associated with type 2 diabetes mellitus ([place]) After discussion of some side effects, she agrees with trying gabapentin 100 mg. Instructed to start gabapentin 100 mg at night and titrate up to 3 times daily as tolerated. She has an appointment with neurologist next month, strongly recommend keeping it. Appropriate foot care.  Hyperlipidemia associated with type 2 diabetes mellitus ([place]) She is not sure about which medication she is taking. Recommend continuing with atorvastatin 40 mg daily. Low-fat diet. Further recommendation will be given according to lipid panel results.  Anxiety disorder, unspecified type No changes in Prozac. May consider changing to Cymbalta if Gabapentin does not help with neuropathic pain.   Return in about 4 months (around 11/19/2018) for HTN,HLD.    [name]. [name], MD  [facility]. [place] office.

## 2018-07-20 NOTE — Assessment & Plan Note (Signed)
HgA1C is at goal. No changes in current management,will adjust insulin according to A1C results. Regular exercise and healthy diet with avoidance of added sugar food intake is an important part of treatment and recommended. Annual eye exam,she is overdue. Periodic dental and foot care recommended. Recommend calling endocrinologist office to schedule follow-up appointment.

## 2018-07-21 LAB — HEMOGLOBIN A1C
EAG (MMOL/L): 19 (calc)
Hgb A1c MFr Bld: 13.6 % of total Hgb — ABNORMAL HIGH (ref <5.7)
Mean Plasma Glucose: 344 (calc)

## 2018-07-28 ENCOUNTER — Other Ambulatory Visit: Payer: Self-pay | Admitting: Family Medicine

## 2018-08-03 ENCOUNTER — Other Ambulatory Visit: Payer: Self-pay | Admitting: *Deleted

## 2018-08-03 MED ORDER — AMLODIPINE BESYLATE 10 MG PO TABS: 10.0000 mg | ORAL_TABLET | Freq: Every day | ORAL | 3 refills | Status: DC

## 2018-08-07 ENCOUNTER — Other Ambulatory Visit: Payer: Self-pay | Admitting: Family Medicine

## 2018-08-07 MED ORDER — INSULIN ASPART 100 UNIT/ML FLEXPEN: PEN_INJECTOR | SUBCUTANEOUS | 3 refills | Status: DC

## 2018-08-07 NOTE — Progress Notes (Signed)
[place]

## 2018-08-17 ENCOUNTER — Ambulatory Visit: Payer: BC Managed Care – PPO | Admitting: Registered"

## 2018-08-22 ENCOUNTER — Ambulatory Visit: Payer: BC Managed Care – PPO | Admitting: Neurology

## 2018-08-29 ENCOUNTER — Ambulatory Visit (INDEPENDENT_AMBULATORY_CARE_PROVIDER_SITE_OTHER): Payer: BC Managed Care – PPO | Admitting: Family Medicine

## 2018-08-29 ENCOUNTER — Encounter: Payer: Self-pay | Admitting: Family Medicine

## 2018-08-29 ENCOUNTER — Other Ambulatory Visit: Payer: Self-pay

## 2018-08-29 VITALS — HR 72 | Resp 12

## 2018-08-29 DIAGNOSIS — I10 Essential (primary) hypertension: Secondary | ICD-10-CM

## 2018-08-29 DIAGNOSIS — E1142 Type 2 diabetes mellitus with diabetic polyneuropathy: Secondary | ICD-10-CM

## 2018-08-29 DIAGNOSIS — M79601 Pain in right arm: Secondary | ICD-10-CM | POA: Diagnosis not present

## 2018-08-29 MED ORDER — GABAPENTIN 300 MG PO CAPS: 300.0000 mg | ORAL_CAPSULE | Freq: Three times a day (TID) | ORAL | 1 refills | Status: DC

## 2018-08-29 MED ORDER — BLOOD PRESSURE MONITOR AUTOMAT DEVI: 1.0000 | Freq: Every day | 0 refills | Status: DC

## 2018-08-29 MED ORDER — LISINOPRIL-HYDROCHLOROTHIAZIDE 20-25 MG PO TABS: 1.0000 | ORAL_TABLET | Freq: Every day | ORAL | 2 refills | Status: DC

## 2018-08-29 NOTE — Progress Notes (Addendum)
Virtual Visit via Video Note   I connected with [name] on 08/29/18 at 12:00 PM EDT by a video enabled telemedicine application and verified that I am speaking with the correct person using two identifiers.  Location patient: home Location provider:work or home office Persons participating in the virtual visit: patient, provider  I discussed the limitations of evaluation and management by telemedicine and the availability of in person appointments. She expressed understanding and agreed to proceed.   HPI: Lisa [name] is a 58 yo female with Hx of DM II,peripheral neuropathy,and HTN among some;who is c/o persistent RUE achy pain that started after having EMG done ,06/14/18. Pain involves "the whole" right arm.  Problem is stable,constant,worse at night when lying on right side. Pain is "bad sometimes", alleviated by movement.  Denies neck pain,odynophagia,focal deficit,or skin rash.  Denies new numbness,tingling,or burning sensation. EMG was initially ordered because bilateral hand numbness and tingling.  Denies having similar symptoms before test was done.  Last visit, 07/23/2018, she was started on gabapentin 100 mg 3 times daily. She has tolerated medication well, has not noted major improvement.  Poorly controlled DM II with neuropathy.  Lab Results  Component Value Date   HGBA1C 13.6 (H) 07/20/2018    HTN: She is not checking BP. Wonders if she really needs all these antihypertensive medications. Currently she is on lisinopril-HCTZ 20-25 mg daily and Amlodipine 10 mg daily.  Denies severe/frequent headache, visual changes, chest pain, dyspnea, palpitation, claudication, focal weakness, or edema.  Lab Results  Component Value Date   CREATININE 0.74 07/20/2018   BUN 19 07/20/2018   NA 136 07/20/2018   K 4.3 07/20/2018   CL 99 07/20/2018   CO2 31 07/20/2018     ROS: See pertinent positives and negatives per HPI. COVID-19 screening questions: Denies new  fever,cough,sore throat,or possible exposure to COVID-19. Negative for loss in the sense of smell or taste.   Past Medical History:  Diagnosis Date  . Abnormality of gait 05/10/2010  . BACK PAIN 11/14/2008  . CEREBROVASCULAR DISEASE 08/05/2008  . DIABETES MELLITUS, TYPE II 07/15/2008  . Diplopia 07/15/2008  . ECZEMA, ATOPIC 04/03/2009  . Guillain-Barre ([place])   . HYPERLIPIDEMIA 03/06/2009  . HYPERTENSION 07/15/2008  . Stroke ([place]) 2010   x2   . Vertebral artery stenosis     Past Surgical History:  Procedure Laterality Date  . ABDOMINAL HYSTERECTOMY    . DILATION AND CURETTAGE OF UTERUS    . FOOT SURGERY      Family History  Problem Relation Age of Onset  . Diabetes Sister   . Asthma Other   . Stroke Other   . Hypertension Other   . Breast cancer Neg Hx     Social History   Socioeconomic History  . Marital status: Single    Spouse name: Not on file  . Number of children: Not on file  . Years of education: Not on file  . Highest education level: Not on file  Occupational History  . Not on file  Social Needs  . Financial resource strain: Not on file  . Food insecurity:    Worry: Not on file    Inability: Not on file  . Transportation needs:    Medical: Not on file    Non-medical: Not on file  Tobacco Use  . Smoking status: Never Smoker  . Smokeless tobacco: Never Used  Substance and Sexual Activity  . Alcohol use: No  . Drug use: No  . Sexual activity:  Not on file  Lifestyle  . Physical activity:    Days per week: Not on file    Minutes per session: Not on file  . Stress: Not on file  Relationships  . Social connections:    Talks on phone: Not on file    Gets together: Not on file    Attends religious service: Not on file    Active member of club or organization: Not on file    Attends meetings of clubs or organizations: Not on file    Relationship status: Not on file  . Intimate partner violence:    Fear of current or ex partner: Not on file     Emotionally abused: Not on file    Physically abused: Not on file    Forced sexual activity: Not on file  Other Topics Concern  . Not on file  Social History Narrative  . Not on file     Current Outpatient Medications:  .  amLODipine (NORVASC) 10 MG tablet, Take 1 tablet (10 mg total) by mouth daily., Disp: 90 tablet, Rfl: 3 .  atorvastatin (LIPITOR) 40 MG tablet, Take 1 tablet (40 mg total) by mouth daily., Disp: 90 tablet, Rfl: 3 .  Blood Pressure Monitoring (BLOOD PRESSURE MONITOR AUTOMAT) DEVI, 1 Device by Does not apply route daily., Disp: 1 Device, Rfl: 0 .  clopidogrel (PLAVIX) 75 MG tablet, Take 1 tablet (75 mg total) by mouth daily., Disp: 90 tablet, Rfl: 3 .  FLUoxetine (PROZAC) 20 MG capsule, TAKE 1 CAPSULE BY MOUTH ONCE DAILY, Disp: 90 capsule, Rfl: 0 .  gabapentin (NEURONTIN) 300 MG capsule, Take 1 capsule (300 mg total) by mouth 3 (three) times daily., Disp: 90 capsule, Rfl: 1 .  glucose blood (CONTOUR NEXT TEST) test strip, 1 each by Other route 2 (two) times daily. And lancets 2/day, Disp: 200 each, Rfl: 3 .  glucose blood (ONETOUCH VERIO) test strip, USE TO CHECK BLOOD SUGAR TWICE A DAY AND PRN, Disp: 100 each, Rfl: 6 .  insulin aspart (NOVOLOG) 100 UNIT/ML FlexPen, 15 U 5-10 min before meals or with meals.Extra Novalog insulin if sugars is over 150 as follow:Add 1 unit of NovoLog if your sugar is 150-200. Add 3 units of NovoLog if your sugar is 201-250.Add 5 units of NovoLog if your sugar is 251-300 Add 7 units of NovoLog if your sugar is over 301, Disp: 15 mL, Rfl: 3 .  Insulin Glargine (LANTUS) 100 UNIT/ML Solostar Pen, Inject 120 Units into the skin every morning. And pen needles 1/day, Disp: 15 pen, Rfl: 11 .  lisinopril-hydrochlorothiazide (ZESTORETIC) 20-25 MG tablet, Take 1 tablet by mouth daily., Disp: 90 tablet, Rfl: 2 .  omeprazole (PRILOSEC) 20 MG capsule, Take 1 capsule by mouth once daily, Disp: 30 capsule, Rfl: 0 .  ONE TOUCH LANCETS MISC, USE TO CHECK BLOOD  SUGAR TWICE A DAY AND PRN, Disp: 100 each, Rfl: 6 .  pravastatin (PRAVACHOL) 40 MG tablet, TAKE 1 TABLET BY MOUTH ONCE DAILY, Disp: 90 tablet, Rfl: 0 .  triamcinolone ointment (KENALOG) 0.5 %, APPLY  TWICE DAILY, Disp: 15 g, Rfl: 1  EXAM:  VITALS per patient if applicable:Pulse 72   Resp 12   GENERAL: alert, oriented, appears well and in no acute distress  HEENT: atraumatic, conjunctiva clear, no obvious facial abnormalities on inspection.  NECK: normal movements of the head and neck  LUNGS: on inspection no signs of respiratory distress, breathing rate appears normal, no obvious gross SOB, gasping or wheezing  CV: no obvious cyanosis  Lisa: moves all visible extremities without noticeable abnormality  PSYCH/NEURO: pleasant and cooperative, no obvious depression or anxiety, speech and thought processing grossly intact  ASSESSMENT AND PLAN:  Discussed the following assessment and plan:  Essential hypertension  BP has been adequately controlled. Recommend monitoring BP at home, BP monitor prescription sent to her pharmacy. We discussed possible complications of elevated BP. I recommend continuing same medications. Continue low salt diet. Follow-up in 3 months.  Pain of right upper extremity She is reporting problem as new. Possible etiology discussed.?  Radiculopathy vs diabetic neuropathy. Gabapentin increased from 100 mg 3 times daily to 300 mg 3 times daily.  She still has gabapentin 100 mg at home, so recommend titrating dose as tolerated to 300 mg 3 times daily. Instructed about warning signs. Keep next appointment with neurologist.  Diabetic peripheral neuropathy associated with type 2 diabetes mellitus ([place]) DM2 has been poorly controlled. He is okay with diagnosis, prognosis, and treatment options. Gabapentin 300 mg 3 times daily recommended. Better glucose control. Continue following with neurologist.   I discussed the assessment and treatment plan with the  patient. The patient was provided an opportunity to ask questions and all were answered. The patient agreed with the plan and demonstrated an understanding of the instructions.    Return in about 3 months (around 11/28/2018) for HTN,neuropathy.    Rasheedah Reis [name], MD

## 2018-08-30 ENCOUNTER — Encounter: Payer: BC Managed Care – PPO | Attending: Endocrinology | Admitting: Registered"

## 2018-08-30 ENCOUNTER — Encounter: Payer: Self-pay | Admitting: Registered"

## 2018-08-30 ENCOUNTER — Other Ambulatory Visit: Payer: Self-pay

## 2018-08-30 DIAGNOSIS — E119 Type 2 diabetes mellitus without complications: Secondary | ICD-10-CM | POA: Diagnosis present

## 2018-08-30 DIAGNOSIS — Z713 Dietary counseling and surveillance: Secondary | ICD-10-CM | POA: Diagnosis present

## 2018-08-30 NOTE — Patient Instructions (Addendum)
-   Have snack before bed such as fruit + peanut butter.   - Continue having 3 meals a day and checking BS numbers.   - Continue to a create consistency within your day. You're doing a great job!

## 2018-08-30 NOTE — Progress Notes (Signed)
Diabetes Self-Management Education  Visit Type:  Follow-up  Appt. Start Time: 10:33 Appt. End Time: 11:17  08/30/2018  [name], identified by name and date of birth, is a 58 y.o. female with a diagnosis of Diabetes: Type 2.   ASSESSMENT  Review meals, BS numbers, and chronic complications  Pt states she has been eating baked chicken/Mata + vegetables + starches and trying to balance meals. Pt states she made a cake for herself and used less icing than usual.   Pt states she was shaking a little this past Mon, BS was in 80's, drunk OJ and brought BS back up. Pt states she was unsure what brought it down. States she jumped up to catch teleconference prayer call and thinks the excitement of that caused numbers to decrease.   Pt states even though work schedule has changed, she is intentional about eating 3 meals a day and having lunch. Currently working 8-4pm on Mon and Tues.    There were no vitals taken for this visit. There is no height or weight on file to calculate BMI.   Diabetes Self-Management Education - 08/30/18 1000      Initial Visit   What type of meal plan do you follow?  balancing plate with protein, vegetables, and starches      Health Coping   How would you rate your overall health?  Fair      Complications   How often do you check your blood sugar?  1-2 times/day    Fasting Blood glucose range (mg/dL)  130-179;180-200;>200    Postprandial Blood glucose range (mg/dL)  180-200    Number of hypoglycemic episodes per month  --   10   Can you tell when your blood sugar is low?  Yes    What do you do if your blood sugar is low?  drinks orange juice    Number of hyperglycemic episodes per week  1    Can you tell when your blood sugar is high?  Yes    What do you do if your blood sugar is high?  nothing or rest   sit down and rest or nothing   Have you had a dilated eye exam in the past 12 months?  Yes    Have you had a dental exam in the past 12 months?   No    Are you checking your feet?  Yes    How many days per week are you checking your feet?  7      Dietary Intake   Breakfast  Cheerios + 2% milk    Snack (morning)  orange juice    Lunch  casserole (green beans + sliced potates, steakums)    Snack (afternoon)  Cheerwine    Dinner  3 baked wings + casserole    Beverage(s)  water (64+ oz), Cheerwine, orange juice      Post-Education Assessment   Patient understands incorporating nutritional management into lifestyle.  Demonstrates understanding / competency    Patient understands monitoring blood glucose, interpreting and using results  Demonstrates understanding / competency    Patient understands prevention, detection, and treatment of chronic complications.  Demonstrates understanding / competency    Patient understands how to develop strategies to address psychosocial issues.  Demonstrates understanding / competency    Patient understands how to develop strategies to promote health/change behavior.  Demonstrates understanding / competency      Outcomes   Program Status  Completed  Subsequent Visit   Since your last visit have you continued or begun to take your medications as prescribed?  Yes    Since your last visit have you had your blood pressure checked?  Yes    Is your most recent blood pressure lower, unchanged, or higher since your last visit?  Unchanged    Since your last visit have you experienced any weight changes?  No change    Since your last visit, are you checking your blood glucose at least once a day?  Yes       Learning Objective:  Patient will have a greater understanding of diabetes self-management. Patient education plan is to attend individual and/or group sessions per assessed needs and concerns.   Plan:   Patient Instructions  - Have snack before bed such as fruit + peanut butter.   - Continue having 3 meals a day and checking BS numbers.   - Continue to a create consistency within your day.  You're doing a great job!    Expected Outcomes:  Demonstrated interest in learning. Expect positive outcomes  Education material provided: ADA - How to Thrive: A Guide for Your Journey with Diabetes  If problems or questions, patient to contact team via:  Phone and Email  Future DSME appointment: - Yearly

## 2018-09-14 ENCOUNTER — Other Ambulatory Visit: Payer: Self-pay | Admitting: Family Medicine

## 2018-09-17 ENCOUNTER — Other Ambulatory Visit: Payer: Self-pay

## 2018-09-17 ENCOUNTER — Ambulatory Visit (INDEPENDENT_AMBULATORY_CARE_PROVIDER_SITE_OTHER): Payer: BC Managed Care – PPO | Admitting: Family Medicine

## 2018-09-17 ENCOUNTER — Encounter: Payer: Self-pay | Admitting: Family Medicine

## 2018-09-17 DIAGNOSIS — M65349 Trigger finger, unspecified ring finger: Secondary | ICD-10-CM

## 2018-09-17 DIAGNOSIS — E1142 Type 2 diabetes mellitus with diabetic polyneuropathy: Secondary | ICD-10-CM

## 2018-09-17 DIAGNOSIS — M25542 Pain in joints of left hand: Secondary | ICD-10-CM | POA: Diagnosis not present

## 2018-09-17 DIAGNOSIS — M25541 Pain in joints of right hand: Secondary | ICD-10-CM | POA: Diagnosis not present

## 2018-09-17 NOTE — Assessment & Plan Note (Signed)
Upper extremity numbness and tingling have improved with gabapentin. Appropriate skin care and glucose must be better controlled. No changes in current management. She is also following with neurologist, recommend keeping next appointment.

## 2018-09-17 NOTE — Progress Notes (Signed)
Virtual Visit via Video Note   I connected with [name] on 09/17/18 at  4:30 PM EDT by a video enabled telemedicine application and verified that I am speaking with the correct person using two identifiers.  Location patient: home Location provider:home office Persons participating in the virtual visit: patient, provider  I discussed the limitations of evaluation and management by telemedicine and the availability of in person appointments. The patient expressed understanding and agreed to proceed.   HPI: [name] is a 58 yo with Hx of DM II,peripheral neuropathy,and HT c/o hand joint pain and "puffiness."  Problem is bilateral and has been going on for a while and stable, MCP joints. Pain is exacerbated by movement and better with rest. + Stiffness.  Denies joint edema or erythema. Negative for associated fever,chills,unusual fatigue,or skin rash.  She was hoping that it would also improve with Gabapentin. She has not noted deformities or significant limitation of ROM. Ring finger gets "stuck" sometimes, she has to actively extend fingers. No hx of trauma.  She has not tried OTC medications.  She is on Gabapentin 300 mg tid. Medication has helped with UE numbness and tingling. She has tolerated medication well.  ROS: See pertinent positives and negatives per HPI.  Past Medical History:  Diagnosis Date  . Abnormality of gait 05/10/2010  . BACK PAIN 11/14/2008  . CEREBROVASCULAR DISEASE 08/05/2008  . DIABETES MELLITUS, TYPE II 07/15/2008  . Diplopia 07/15/2008  . ECZEMA, ATOPIC 04/03/2009  . Guillain-Barre ([place])   . HYPERLIPIDEMIA 03/06/2009  . HYPERTENSION 07/15/2008  . Stroke ([place]) 2010   x2   . Vertebral artery stenosis     Past Surgical History:  Procedure Laterality Date  . ABDOMINAL HYSTERECTOMY    . DILATION AND CURETTAGE OF UTERUS    . FOOT SURGERY      Family History  Problem Relation Age of Onset  . Diabetes Sister   . Asthma Other   . Stroke Other    . Hypertension Other   . Breast cancer Neg Hx     Social History   Socioeconomic History  . Marital status: Single    Spouse name: Not on file  . Number of children: Not on file  . Years of education: Not on file  . Highest education level: Not on file  Occupational History  . Not on file  Social Needs  . Financial resource strain: Not on file  . Food insecurity:    Worry: Not on file    Inability: Not on file  . Transportation needs:    Medical: Not on file    Non-medical: Not on file  Tobacco Use  . Smoking status: Never Smoker  . Smokeless tobacco: Never Used  Substance and Sexual Activity  . Alcohol use: No  . Drug use: No  . Sexual activity: Not on file  Lifestyle  . Physical activity:    Days per week: Not on file    Minutes per session: Not on file  . Stress: Not on file  Relationships  . Social connections:    Talks on phone: Not on file    Gets together: Not on file    Attends religious service: Not on file    Active member of club or organization: Not on file    Attends meetings of clubs or organizations: Not on file    Relationship status: Not on file  . Intimate partner violence:    Fear of current or ex partner: Not on file  Emotionally abused: Not on file    Physically abused: Not on file    Forced sexual activity: Not on file  Other Topics Concern  . Not on file  Social History Narrative  . Not on file      Current Outpatient Medications:  .  amLODipine (NORVASC) 10 MG tablet, Take 1 tablet (10 mg total) by mouth daily., Disp: 90 tablet, Rfl: 3 .  atorvastatin (LIPITOR) 40 MG tablet, Take 1 tablet (40 mg total) by mouth daily., Disp: 90 tablet, Rfl: 3 .  Blood Pressure Monitoring (BLOOD PRESSURE MONITOR AUTOMAT) DEVI, 1 Device by Does not apply route daily., Disp: 1 Device, Rfl: 0 .  clopidogrel (PLAVIX) 75 MG tablet, Take 1 tablet (75 mg total) by mouth daily., Disp: 90 tablet, Rfl: 3 .  FLUoxetine (PROZAC) 20 MG capsule, TAKE 1 CAPSULE  BY MOUTH ONCE DAILY, Disp: 90 capsule, Rfl: 0 .  gabapentin (NEURONTIN) 300 MG capsule, Take 1 capsule (300 mg total) by mouth 3 (three) times daily., Disp: 90 capsule, Rfl: 1 .  glucose blood (CONTOUR NEXT TEST) test strip, 1 each by Other route 2 (two) times daily. And lancets 2/day, Disp: 200 each, Rfl: 3 .  glucose blood (ONETOUCH VERIO) test strip, USE TO CHECK BLOOD SUGAR TWICE A DAY AND PRN, Disp: 100 each, Rfl: 6 .  insulin aspart (NOVOLOG) 100 UNIT/ML FlexPen, 15 U 5-10 min before meals or with meals.Extra Novalog insulin if sugars is over 150 as follow:Add 1 unit of NovoLog if your sugar is 150-200. Add 3 units of NovoLog if your sugar is 201-250.Add 5 units of NovoLog if your sugar is 251-300 Add 7 units of NovoLog if your sugar is over 301, Disp: 15 mL, Rfl: 3 .  Insulin Glargine (LANTUS) 100 UNIT/ML Solostar Pen, Inject 120 Units into the skin every morning. And pen needles 1/day, Disp: 15 pen, Rfl: 11 .  lisinopril-hydrochlorothiazide (ZESTORETIC) 20-25 MG tablet, Take 1 tablet by mouth daily., Disp: 90 tablet, Rfl: 2 .  omeprazole (PRILOSEC) 20 MG capsule, Take 1 capsule by mouth once daily, Disp: 30 capsule, Rfl: 0 .  ONE TOUCH LANCETS MISC, USE TO CHECK BLOOD SUGAR TWICE A DAY AND PRN, Disp: 100 each, Rfl: 6 .  pravastatin (PRAVACHOL) 40 MG tablet, Take 1 tablet by mouth once daily, Disp: 90 tablet, Rfl: 0 .  triamcinolone ointment (KENALOG) 0.5 %, APPLY  TWICE DAILY, Disp: 15 g, Rfl: 1  EXAM:  VITALS per patient if applicable:N/A  GENERAL: alert, oriented, appears well and in no acute distress  HEENT: atraumatic, conjunctiva clear, no obvious facial abnormalities on inspection.  LUNGS: on inspection no signs of respiratory distress, breathing rate appears normal, no obvious gross SOB, gasping or wheezing  CV: no obvious cyanosis  MS: moves all visible extremities without noticeable abnormality, no signs of synovitis.Left ring finger "popping" sensation when extending finger  during visit.  PSYCH/NEURO: pleasant and cooperative, no obvious depression or anxiety, speech and thought processing grossly intact  ASSESSMENT AND PLAN:  Discussed the following assessment and plan:  Arthralgia of both hands Possible etiology discussed. Most likely OA. I do not think imaging or blood work is needed at this time. OTC Tylenol 500 mg tid prn.  Trigger ring finger, unspecified laterality - Plan: Ambulatory referral to Orthopedic Surgery Hx suggest trigger fingers. Educated about Dx. Left seems to be worse than right. Treatment options discussed. Ortho referral placed.   Diabetic peripheral neuropathy associated with type 2 diabetes mellitus ([facility]) Upper extremity numbness  and tingling have improved with gabapentin. Appropriate skin care and glucose must be better controlled. No changes in current management. She is also following with neurologist, recommend keeping next appointment.     I discussed the assessment and treatment plan with the patient. She was provided an opportunity to ask questions and all were answered. The patient agreed with the plan and demonstrated an understanding of the instructions.   The patient was advised to call back or seek an in-person evaluation if the symptoms worsen or if the condition fails to improve as anticipated.  Return if symptoms worsen or fail to improve, for Keep next appt.     [name], MD

## 2018-10-04 ENCOUNTER — Ambulatory Visit: Payer: BC Managed Care – PPO | Admitting: Orthopaedic Surgery

## 2018-10-04 ENCOUNTER — Encounter: Payer: Self-pay | Admitting: Orthopaedic Surgery

## 2018-10-04 ENCOUNTER — Other Ambulatory Visit: Payer: Self-pay

## 2018-10-04 VITALS — BP 139/79 | HR 84 | Ht 64.0 in | Wt 180.0 lb

## 2018-10-04 DIAGNOSIS — M65342 Trigger finger, left ring finger: Secondary | ICD-10-CM | POA: Diagnosis not present

## 2018-10-04 DIAGNOSIS — M65341 Trigger finger, right ring finger: Secondary | ICD-10-CM | POA: Diagnosis not present

## 2018-10-04 MED ORDER — BUPIVACAINE HCL 0.25 % IJ SOLN
0.3300 mL | INTRAMUSCULAR | Status: AC | PRN
  Administered 2018-10-04: .33 mL

## 2018-10-04 MED ORDER — LIDOCAINE HCL 1 % IJ SOLN
0.5000 mL | INTRAMUSCULAR | Status: AC | PRN
  Administered 2018-10-04: .5 mL

## 2018-10-04 MED ORDER — METHYLPREDNISOLONE ACETATE 40 MG/ML IJ SUSP
13.3300 mg | INTRAMUSCULAR | Status: AC | PRN
  Administered 2018-10-04: 13.33 mg

## 2018-10-04 NOTE — Progress Notes (Signed)
Office Visit Note   Patient: [name]           Date of Birth: 1961/01/09           MRN: [id number] Visit Date: 10/04/2018              Requested by: [name], [name], MD [address] [place], [place] [address] PCP: [name], [name], MD   Assessment & Plan: Visit Diagnoses:  1. Trigger finger, right ring finger   2. Trigger finger, left ring finger     Plan:  #1:  Corticosteroid injection to Right Ring A-1 Pulley #2:  Return for injection to Left Ring A-1 Pulley  Follow-Up Instructions: Return in about 1 week (around 10/11/2018).   Orders:  No orders of the defined types were placed in this encounter.  No orders of the defined types were placed in this encounter.     Procedures: Hand/UE Inj: R ring A1 for trigger finger on 10/04/2018 3:19 PM Indications: pain, tendon swelling and therapeutic Details: 27 G needle, volar approach Medications: 0.5 mL lidocaine 1 %; 0.33 mL bupivacaine 0.25 %; 13.33 mg methylPREDNISolone acetate 40 MG/ML Outcome: tolerated well, no immediate complications Procedure, treatment alternatives, risks and benefits explained, specific risks discussed. Consent was given by the patient. Immediately prior to procedure a time out was called to verify the correct patient, procedure, equipment, support staff and site/side marked as required. Patient was prepped and draped in the usual sterile fashion.       Clinical Data: No additional findings.   Subjective: Chief Complaint  Patient presents with  . Right Hand - Pain  . Left Hand - Pain   HPI  Patient presents today with bilateral ring trigger finger. She said that it has been present for a few months. She said that both ringer fingers lock up and are painful upon doing so. Right worse than left. She said that they have not worsened. She spoke with her PCP and was referred here.    Review of Systems  Constitutional: Negative for fatigue.  HENT: Negative for ear pain.   Eyes:  Negative for pain.  Respiratory: Negative for shortness of breath.   Cardiovascular: Negative for leg swelling.  Gastrointestinal: Positive for constipation. Negative for diarrhea.  Endocrine: Negative for cold intolerance and heat intolerance.  Genitourinary: Negative for difficulty urinating.  Musculoskeletal: Negative for joint swelling.  Skin: Negative for rash.  Allergic/Immunologic: Negative for food allergies.  Neurological: Negative for weakness.  Hematological: Does not bruise/bleed easily.  Psychiatric/Behavioral: Negative for sleep disturbance.     Objective: Vital Signs: BP 139/79   Pulse 84   Ht 5\' 4"  (1.626 m)   Wt 180 lb (81.6 kg)   BMI 30.90 kg/m   Physical Exam Constitutional:      Appearance: She is well-developed.  Eyes:     Pupils: Pupils are equal, round, and reactive to light.  Pulmonary:     Effort: Pulmonary effort is normal.  Skin:    General: Skin is warm and dry.  Neurological:     Mental Status: She is alert and oriented to person, place, and time.  Psychiatric:        Behavior: Behavior normal.     Ortho Exam  Palpable nodule on tendon at A-1 pulley bilateral ring finger Full Flexion and extension Good cap refill Sensation intact   Specialty Comments:  No specialty comments available.  Imaging: No results found.   PMFS History: Current Outpatient Medications  Medication  Sig Dispense Refill  . amLODipine (NORVASC) 10 MG tablet Take 1 tablet (10 mg total) by mouth daily. 90 tablet 3  . atorvastatin (LIPITOR) 40 MG tablet Take 1 tablet (40 mg total) by mouth daily. 90 tablet 3  . Blood Pressure Monitoring (BLOOD PRESSURE MONITOR AUTOMAT) DEVI 1 Device by Does not apply route daily. 1 Device 0  . clopidogrel (PLAVIX) 75 MG tablet Take 1 tablet (75 mg total) by mouth daily. 90 tablet 3  . FLUoxetine (PROZAC) 20 MG capsule TAKE 1 CAPSULE BY MOUTH ONCE DAILY 90 capsule 0  . gabapentin (NEURONTIN) 300 MG capsule Take 1 capsule (300 mg  total) by mouth 3 (three) times daily. 90 capsule 1  . glucose blood (CONTOUR NEXT TEST) test strip 1 each by Other route 2 (two) times daily. And lancets 2/day 200 each 3  . glucose blood (ONETOUCH VERIO) test strip USE TO CHECK BLOOD SUGAR TWICE A DAY AND PRN 100 each 6  . insulin aspart (NOVOLOG) 100 UNIT/ML FlexPen 15 U 5-10 min before meals or with meals.Extra Novalog insulin if sugars is over 150 as follow:Add 1 unit of NovoLog if your sugar is 150-200. Add 3 units of NovoLog if your sugar is 201-250.Add 5 units of NovoLog if your sugar is 251-300 Add 7 units of NovoLog if your sugar is over 301 15 mL 3  . Insulin Glargine (LANTUS) 100 UNIT/ML Solostar Pen Inject 120 Units into the skin every morning. And pen needles 1/day 15 pen 11  . lisinopril-hydrochlorothiazide (ZESTORETIC) 20-25 MG tablet Take 1 tablet by mouth daily. 90 tablet 2  . omeprazole (PRILOSEC) 20 MG capsule Take 1 capsule by mouth once daily 30 capsule 0  . ONE TOUCH LANCETS MISC USE TO CHECK BLOOD SUGAR TWICE A DAY AND PRN 100 each 6  . pravastatin (PRAVACHOL) 40 MG tablet Take 1 tablet by mouth once daily 90 tablet 0  . triamcinolone ointment (KENALOG) 0.5 % APPLY  TWICE DAILY 15 g 1   No current facility-administered medications for this visit.     Patient Active Problem List   Diagnosis Date Noted  . Trigger finger, left ring finger 10/04/2018  . Alternating constipation and diarrhea 04/20/2018  . Diabetic peripheral neuropathy associated with type 2 diabetes mellitus ([facility]) 02/26/2018  . DM (diabetes mellitus), type 2 with peripheral vascular complications ([facility]) 69/62/9528  . ABNORMALITY OF GAIT 05/10/2010  . ECZEMA, ATOPIC 04/03/2009  . Hyperlipidemia associated with type 2 diabetes mellitus ([facility]) 03/06/2009  . BACK PAIN 11/14/2008  . Cerebrovascular disease 08/05/2008  . DIPLOPIA 07/15/2008  . Essential hypertension 07/15/2008   Past Medical History:  Diagnosis Date  . Abnormality of gait 05/10/2010  . BACK  PAIN 11/14/2008  . CEREBROVASCULAR DISEASE 08/05/2008  . DIABETES MELLITUS, TYPE II 07/15/2008  . Diplopia 07/15/2008  . ECZEMA, ATOPIC 04/03/2009  . Guillain-Barre ([place])   . HYPERLIPIDEMIA 03/06/2009  . HYPERTENSION 07/15/2008  . Stroke ([facility]) 2010   x2   . Vertebral artery stenosis     Family History  Problem Relation Age of Onset  . Diabetes Sister   . Asthma Other   . Stroke Other   . Hypertension Other   . Breast cancer Neg Hx     Past Surgical History:  Procedure Laterality Date  . ABDOMINAL HYSTERECTOMY    . DILATION AND CURETTAGE OF UTERUS    . FOOT SURGERY     Social History   Occupational History  . Not on file  Tobacco Use  .  Smoking status: Never Smoker  . Smokeless tobacco: Never Used  Substance and Sexual Activity  . Alcohol use: No  . Drug use: No  . Sexual activity: Not on file

## 2018-10-05 ENCOUNTER — Telehealth: Payer: Self-pay | Admitting: Family Medicine

## 2018-10-05 ENCOUNTER — Other Ambulatory Visit: Payer: Self-pay | Admitting: Family Medicine

## 2018-10-05 MED ORDER — TRIAMCINOLONE ACETONIDE 0.5 % EX OINT: TOPICAL_OINTMENT | Freq: Two times a day (BID) | CUTANEOUS | 0 refills | Status: DC | PRN

## 2018-10-05 NOTE — Telephone Encounter (Signed)
Pt is calling wanting to see if she could get a refill on triamcinolone ointment she refused to make an appointment. Pharm:   [facility] on [address].

## 2018-10-05 NOTE — Telephone Encounter (Signed)
I sent a prescription for triamcinolone ointment as requested.  I do not think we have discussed her skin issues recently.So we can address this problem next visit, before if more refills are needed.  Thanks, BJ

## 2018-10-05 NOTE — Telephone Encounter (Signed)
Message sent to Dr. Jordan for review and approval. 

## 2018-10-08 NOTE — Telephone Encounter (Signed)
Noted. Patient informed.

## 2018-10-10 ENCOUNTER — Encounter: Payer: Self-pay | Admitting: Orthopaedic Surgery

## 2018-10-10 ENCOUNTER — Ambulatory Visit: Payer: BC Managed Care – PPO | Admitting: Orthopaedic Surgery

## 2018-10-10 ENCOUNTER — Other Ambulatory Visit: Payer: Self-pay

## 2018-10-10 VITALS — BP 155/81 | HR 84 | Ht 64.0 in | Wt 180.0 lb

## 2018-10-10 DIAGNOSIS — M65342 Trigger finger, left ring finger: Secondary | ICD-10-CM | POA: Diagnosis not present

## 2018-10-10 MED ORDER — METHYLPREDNISOLONE ACETATE 40 MG/ML IJ SUSP
20.0000 mg | INTRAMUSCULAR | Status: AC | PRN
  Administered 2018-10-10: 16:00:00 20 mg

## 2018-10-10 MED ORDER — LIDOCAINE HCL 1 % IJ SOLN
0.5000 mL | INTRAMUSCULAR | Status: AC | PRN
  Administered 2018-10-10: .5 mL

## 2018-10-10 NOTE — Progress Notes (Signed)
Office Visit Note   Patient: [name]           Date of Birth: 1960/06/23           MRN: [id number] Visit Date: 10/10/2018              Requested by: [name], [name], MD [address] [place], [place] [address] PCP: [name], [name], MD   Assessment & Plan: Visit Diagnoses:  1. Trigger finger, left ring finger     Plan: Successful trigger finger injection right ring last week.  Wants to have same injection to left ring trigger finger.  Follow-up as needed.  Aware of future treatment options  Follow-Up Instructions: Return if symptoms worsen or fail to improve.   Orders:  Orders Placed This Encounter  Procedures  . Hand/UE Inj: L ring A1   No orders of the defined types were placed in this encounter.     Procedures: Hand/UE Inj: L ring A1 for trigger finger on 10/10/2018 4:11 PM Medications: 0.5 mL lidocaine 1 %; 20 mg methylPREDNISolone acetate 40 MG/ML      Clinical Data: No additional findings.   Subjective: Chief Complaint  Patient presents with  . Right Hand - Follow-up  . Left Hand - Follow-up  Patient presents today for a one week follow up on her bilateral ring trigger fingers. She had her right hand injected with cortisone last week. Patient states that the injection helped.  She is returning to have her left hand injected today.  HPI  Review of Systems   Objective: Vital Signs: BP (!) 155/81   Pulse 84   Ht 5\' 4"  (1.626 m)   Wt 180 lb (81.6 kg)   BMI 30.90 kg/m   Physical Exam  Ortho Exam actually feeling better after the injection to the right ring trigger finger.  No active triggering.  Does have some tenderness on the palmar aspect of the left ring finger level of the metacarpal phalangeal joint.  Could not actively trigger the finger today.  No swelling.  Neurologically intact  Specialty Comments:  No specialty comments available.  Imaging: No results found.   PMFS History: Patient Active Problem List   Diagnosis Date  Noted  . Trigger finger, left ring finger 10/04/2018  . Alternating constipation and diarrhea 04/20/2018  . Diabetic peripheral neuropathy associated with type 2 diabetes mellitus ([facility]) 02/26/2018  . DM (diabetes mellitus), type 2 with peripheral vascular complications ([facility]) 09/62/8366  . ABNORMALITY OF GAIT 05/10/2010  . ECZEMA, ATOPIC 04/03/2009  . Hyperlipidemia associated with type 2 diabetes mellitus ([place] [facility]) 03/06/2009  . BACK PAIN 11/14/2008  . Cerebrovascular disease 08/05/2008  . DIPLOPIA 07/15/2008  . Essential hypertension 07/15/2008   Past Medical History:  Diagnosis Date  . Abnormality of gait 05/10/2010  . BACK PAIN 11/14/2008  . CEREBROVASCULAR DISEASE 08/05/2008  . DIABETES MELLITUS, TYPE II 07/15/2008  . Diplopia 07/15/2008  . ECZEMA, ATOPIC 04/03/2009  . Guillain-Barre ([facility])   . HYPERLIPIDEMIA 03/06/2009  . HYPERTENSION 07/15/2008  . Stroke ([facility]) 2010   x2   . Vertebral artery stenosis     Family History  Problem Relation Age of Onset  . Diabetes Sister   . Asthma Other   . Stroke Other   . Hypertension Other   . Breast cancer Neg Hx     Past Surgical History:  Procedure Laterality Date  . ABDOMINAL HYSTERECTOMY    . DILATION AND CURETTAGE OF UTERUS    . FOOT SURGERY  Social History   Occupational History  . Not on file  Tobacco Use  . Smoking status: Never Smoker  . Smokeless tobacco: Never Used  Substance and Sexual Activity  . Alcohol use: No  . Drug use: No  . Sexual activity: Not on file       

## 2018-10-12 ENCOUNTER — Telehealth: Payer: Self-pay

## 2018-10-12 NOTE — Telephone Encounter (Signed)
LOV 03/09/18. Per Dr. Loanne Drilling, f/u in 2 weeks. Called pt to reschedule appt. LVM requesting returned call.

## 2018-10-15 ENCOUNTER — Ambulatory Visit: Payer: BC Managed Care – PPO | Admitting: Neurology

## 2018-10-17 ENCOUNTER — Telehealth: Payer: Self-pay

## 2018-10-17 NOTE — Telephone Encounter (Signed)
LOV 03/09/18. Per Dr. Loanne Drilling, f/u in 2 weeks. Called pt to schedule appt. LVM requesting returned call.

## 2018-10-27 ENCOUNTER — Other Ambulatory Visit: Payer: Self-pay | Admitting: Family Medicine

## 2018-10-29 ENCOUNTER — Other Ambulatory Visit: Payer: Self-pay | Admitting: *Deleted

## 2018-10-29 MED ORDER — OMEPRAZOLE 20 MG PO CPDR: 20.0000 mg | DELAYED_RELEASE_CAPSULE | Freq: Every day | ORAL | 3 refills | Status: DC

## 2018-12-03 ENCOUNTER — Telehealth: Payer: Self-pay | Admitting: Family Medicine

## 2018-12-03 NOTE — Telephone Encounter (Signed)
Copied from [facility] [phone]. Topic: Appointment Scheduling - Scheduling Inquiry for Clinic >> Dec 03, 2018  7:20 AM Lennox Solders wrote: Reason for CRM:  Pt is calling and her co worker wife tested positive for covid 60. Pt has been around Art therapist and just want to have the test done. Pt is not having any symptoms   LMVM for the patient to call back and make a virtual appointment

## 2018-12-04 ENCOUNTER — Other Ambulatory Visit: Payer: Self-pay

## 2018-12-04 DIAGNOSIS — Z20822 Contact with and (suspected) exposure to covid-19: Secondary | ICD-10-CM

## 2018-12-05 ENCOUNTER — Telehealth: Payer: Self-pay | Admitting: Family Medicine

## 2018-12-05 LAB — NOVEL CORONAVIRUS, NAA: SARS-CoV-2, NAA: NOT DETECTED

## 2018-12-05 NOTE — Telephone Encounter (Signed)
Pt returned call for covid results; negative. Pt verbalizes understanding.

## 2019-03-13 ENCOUNTER — Encounter (HOSPITAL_COMMUNITY): Payer: Self-pay

## 2019-03-13 ENCOUNTER — Emergency Department (HOSPITAL_COMMUNITY)
Admission: EM | Admit: 2019-03-13 | Discharge: 2019-03-13 | Disposition: A | Discharge status: Home / Self Care | Payer: BC Managed Care – PPO | Attending: Emergency Medicine | Admitting: Emergency Medicine

## 2019-03-13 ENCOUNTER — Other Ambulatory Visit: Payer: Self-pay | Admitting: Family Medicine

## 2019-03-13 ENCOUNTER — Other Ambulatory Visit: Payer: Self-pay

## 2019-03-13 DIAGNOSIS — Z8673 Personal history of transient ischemic attack (TIA), and cerebral infarction without residual deficits: Secondary | ICD-10-CM | POA: Insufficient documentation

## 2019-03-13 DIAGNOSIS — R109 Unspecified abdominal pain: Secondary | ICD-10-CM | POA: Diagnosis present

## 2019-03-13 DIAGNOSIS — Z79899 Other long term (current) drug therapy: Secondary | ICD-10-CM | POA: Diagnosis not present

## 2019-03-13 DIAGNOSIS — E1140 Type 2 diabetes mellitus with diabetic neuropathy, unspecified: Secondary | ICD-10-CM | POA: Diagnosis not present

## 2019-03-13 DIAGNOSIS — I10 Essential (primary) hypertension: Secondary | ICD-10-CM | POA: Diagnosis not present

## 2019-03-13 DIAGNOSIS — E1165 Type 2 diabetes mellitus with hyperglycemia: Secondary | ICD-10-CM | POA: Insufficient documentation

## 2019-03-13 DIAGNOSIS — Z794 Long term (current) use of insulin: Secondary | ICD-10-CM | POA: Insufficient documentation

## 2019-03-13 DIAGNOSIS — R739 Hyperglycemia, unspecified: Secondary | ICD-10-CM

## 2019-03-13 LAB — URINALYSIS, ROUTINE W REFLEX MICROSCOPIC
Bacteria, UA: NONE SEEN
Bilirubin Urine: NEGATIVE
Glucose, UA: >=500 mg/dL — AB
Hgb urine dipstick: NEGATIVE
Ketones, ur: 5 mg/dL — AB
Nitrite: NEGATIVE
Protein, ur: NEGATIVE mg/dL
Specific Gravity, Urine: 1.018 (ref 1.005–1.030)
pH: 6 (ref 5.0–8.0)

## 2019-03-13 LAB — COMPREHENSIVE METABOLIC PANEL
ALT: 24 U/L (ref 0–44)
AST: 17 U/L (ref 15–41)
Albumin: 3.8 g/dL (ref 3.5–5.0)
Alkaline Phosphatase: 96 U/L (ref 38–126)
Anion gap: 11 (ref 5–15)
BUN: 24 mg/dL — ABNORMAL HIGH (ref 6–20)
CO2: 22 mmol/L (ref 22–32)
Calcium: 10 mg/dL (ref 8.9–10.3)
Chloride: 98 mmol/L (ref 98–111)
Creatinine, Ser: 0.86 mg/dL (ref 0.44–1.00)
GFR calc Af Amer: >60 mL/min (ref >60)
GFR calc non Af Amer: >60 mL/min (ref >60)
Glucose, Bld: 502 mg/dL (ref 70–99)
Potassium: 4.2 mmol/L (ref 3.5–5.1)
Sodium: 131 mmol/L — ABNORMAL LOW (ref 135–145)
Total Bilirubin: 0.4 mg/dL (ref 0.3–1.2)
Total Protein: 7.9 g/dL (ref 6.5–8.1)

## 2019-03-13 LAB — CBG MONITORING, ED
Glucose-Capillary: 527 mg/dL (ref 70–99)
Glucose-Capillary: 489 mg/dL — ABNORMAL HIGH (ref 70–99)
Glucose-Capillary: 364 mg/dL — ABNORMAL HIGH (ref 70–99)
Glucose-Capillary: 359 mg/dL — ABNORMAL HIGH (ref 70–99)
Glucose-Capillary: 273 mg/dL — ABNORMAL HIGH (ref 70–99)

## 2019-03-13 LAB — CBC
HCT: 44.2 % (ref 36.0–46.0)
Hemoglobin: 14.5 g/dL (ref 12.0–15.0)
MCH: 25.8 pg — ABNORMAL LOW (ref 26.0–34.0)
MCHC: 32.8 g/dL (ref 30.0–36.0)
MCV: 78.8 fL — ABNORMAL LOW (ref 80.0–100.0)
Platelets: 245 10*3/uL (ref 150–400)
RBC: 5.61 MIL/uL — ABNORMAL HIGH (ref 3.87–5.11)
RDW: 13.6 % (ref 11.5–15.5)
WBC: 6.3 10*3/uL (ref 4.0–10.5)
nRBC: 0 % (ref 0.0–0.2)

## 2019-03-13 LAB — LIPASE, BLOOD: Lipase: 46 U/L (ref 11–51)

## 2019-03-13 MED ORDER — SODIUM CHLORIDE 0.9 % IV BOLUS
1000.0000 mL | Freq: Once | INTRAVENOUS | Status: AC
  Administered 2019-03-13: 1000 mL via INTRAVENOUS

## 2019-03-13 MED ORDER — INSULIN ASPART 100 UNIT/ML ~~LOC~~ SOLN
5.0000 [IU] | Freq: Once | SUBCUTANEOUS | Status: AC
  Administered 2019-03-13: 5 [IU] via SUBCUTANEOUS
  Filled 2019-03-13: qty 0.05

## 2019-03-13 NOTE — ED Notes (Signed)
EDP at bedside  

## 2019-03-13 NOTE — ED Notes (Signed)
Pt drinking water 

## 2019-03-13 NOTE — Discharge Instructions (Addendum)
Make sure that you watch your glucose intake, by avoiding any concentrated sweets including sodas, candy, cakes, cookies etc.  Continue taking her usual medications.  Also drink 1 or 2 quarts of water each day to help lower your blood sugar.

## 2019-03-13 NOTE — ED Provider Notes (Signed)
[facility] DEPT Provider Note   CSN: [id number] Arrival date & time: 03/13/19  1553     History   Chief Complaint Chief Complaint  Patient presents with  . Hyperglycemia  . Nausea  . Abdominal Pain    HPI [name] is a 58 y.o. female.     HPI   She presents for evaluation of abdominal discomfort and nausea with elevated blood sugar at home.  She states she is taking her usual medication, but blood sugar is high.  She denies dysuria but has urinary frequency.  She denies constipation, fever, chills, cough, shortness of breath, weakness or dizziness.  No known sick contacts.  No recent medical illnesses.  There are no other known modifying factors.  Past Medical History:  Diagnosis Date  . Abnormality of gait 05/10/2010  . BACK PAIN 11/14/2008  . CEREBROVASCULAR DISEASE 08/05/2008  . DIABETES MELLITUS, TYPE II 07/15/2008  . Diplopia 07/15/2008  . ECZEMA, ATOPIC 04/03/2009  . Guillain-Barre ([facility])   . HYPERLIPIDEMIA 03/06/2009  . HYPERTENSION 07/15/2008  . Stroke ([facility]) 2010   x2   . Vertebral artery stenosis     Patient Active Problem List   Diagnosis Date Noted  . Trigger finger, left ring finger 10/04/2018  . Alternating constipation and diarrhea 04/20/2018  . Diabetic peripheral neuropathy associated with type 2 diabetes mellitus ([facility]) 02/26/2018  . DM (diabetes mellitus), type 2 with peripheral vascular complications ([facility]) [id number]  . ABNORMALITY OF GAIT 05/10/2010  . ECZEMA, ATOPIC 04/03/2009  . Hyperlipidemia associated with type 2 diabetes mellitus ([facility]) 03/06/2009  . BACK PAIN 11/14/2008  . Cerebrovascular disease 08/05/2008  . DIPLOPIA 07/15/2008  . Essential hypertension 07/15/2008    Past Surgical History:  Procedure Laterality Date  . ABDOMINAL HYSTERECTOMY    . DILATION AND CURETTAGE OF UTERUS    . FOOT SURGERY       OB History   No obstetric history on file.      Home Medications    Prior to Admission  medications   Medication Sig Start Date End Date Taking? Authorizing Provider  amLODipine (NORVASC) 10 MG tablet Take 1 tablet (10 mg total) by mouth daily. 08/03/18  Yes [name], [name], MD  Aspirin Effervescent (ALKA-SELTZER EXTRA STRENGTH) 500 MG TBEF Take 500 mg by mouth daily as needed (cold symptoms).   Yes [provider]  clopidogrel (PLAVIX) 75 MG tablet Take 1 tablet (75 mg total) by mouth daily. 05/05/17  Yes [name], MD  FLUoxetine (PROZAC) 20 MG capsule Take 1 capsule by mouth once daily Patient taking differently: Take 20 mg by mouth daily.  10/30/18  Yes [name], [name], MD  Insulin Glargine (LANTUS) 100 UNIT/ML Solostar Pen Inject 120 Units into the skin every morning. And pen needles 1/day 03/09/18  Yes [name], MD  lisinopril-hydrochlorothiazide (ZESTORETIC) 20-25 MG tablet Take 1 tablet by mouth daily. 08/29/18  Yes [name], [name], MD  omeprazole (PRILOSEC) 20 MG capsule Take 1 capsule (20 mg total) by mouth daily. 10/29/18  Yes [name], [name], MD  pravastatin (PRAVACHOL) 40 MG tablet Take 1 tablet by mouth once daily 09/14/18  Yes [name], [name], MD  atorvastatin (LIPITOR) 40 MG tablet Take 1 tablet (40 mg total) by mouth daily. Patient not taking: Reported on 03/13/2019 03/01/18   [name], [name], MD  Blood Pressure Monitoring (BLOOD PRESSURE MONITOR AUTOMAT) DEVI 1 Device by Does not apply route daily. 08/29/18   [name], [name], MD  gabapentin (  NEURONTIN) 300 MG capsule Take 1 capsule (300 mg total) by mouth 3 (three) times daily. Patient not taking: Reported on 03/13/2019 08/29/18   [name], [name], MD  glucose blood (CONTOUR NEXT TEST) test strip 1 each by Other route 2 (two) times daily. And lancets 2/day 03/09/18   [name], MD  glucose blood [facility] VERIO) test strip USE TO CHECK BLOOD SUGAR TWICE A DAY AND PRN 07/29/15   [name], MD  insulin aspart (NOVOLOG) 100 UNIT/ML FlexPen 15 U 5-10 min before meals or with meals.Extra Novalog  insulin if sugars is over 150 as follow:Add 1 unit of NovoLog if your sugar is 150-200. Add 3 units of NovoLog if your sugar is 201-250.Add 5 units of NovoLog if your sugar is 251-300 Add 7 units of NovoLog if your sugar is over 301 Patient not taking: Reported on 03/13/2019 08/07/18   [name], [name], MD  omeprazole (PRILOSEC) 20 MG capsule Take 1 capsule by mouth once daily Patient not taking: Reported on 03/13/2019 10/30/18   [name], [name], MD  ONE TOUCH LANCETS MISC USE TO CHECK BLOOD SUGAR TWICE A DAY AND PRN 07/29/15   [name], MD  triamcinolone ointment (KENALOG) 0.5 % Apply topically 2 (two) times daily as needed. Patient not taking: Reported on 03/13/2019 10/05/18   [name], [name], MD    Family History Family History  Problem Relation Age of Onset  . Diabetes Sister   . Asthma Other   . Stroke Other   . Hypertension Other   . Diabetes Mother   . Stroke Mother   . Cancer Father   . Breast cancer Neg Hx     Social History Social History   Tobacco Use  . Smoking status: Never Smoker  . Smokeless tobacco: Never Used  Substance Use Topics  . Alcohol use: No  . Drug use: No     Allergies   Patient has no known allergies.   Review of Systems Review of Systems  All other systems reviewed and are negative.    Physical Exam Updated Vital Signs BP 140/85   Pulse 64   Temp 98.5 F (36.9 C) (Oral)   Resp 18   Ht 5\' 4"  (1.626 m)   Wt 83.9 kg   SpO2 93%   BMI 31.76 kg/m   Physical Exam Vitals signs and nursing note reviewed.  Constitutional:      General: She is not in acute distress.    Appearance: She is well-developed. She is obese. She is not ill-appearing, toxic-appearing or diaphoretic.  HENT:     Head: Normocephalic and atraumatic.     Right Ear: External ear normal.     Left Ear: External ear normal.  Eyes:     Conjunctiva/sclera: Conjunctivae normal.     Pupils: Pupils are equal, round, and reactive to light.  Neck:      Musculoskeletal: Normal range of motion and neck supple.     Trachea: Phonation normal.  Cardiovascular:     Rate and Rhythm: Normal rate and regular rhythm.     Heart sounds: Normal heart sounds.  Pulmonary:     Effort: Pulmonary effort is normal.     Breath sounds: Normal breath sounds.  Abdominal:     General: There is no distension.     Palpations: Abdomen is soft.     Tenderness: There is no abdominal tenderness. There is no guarding.     Hernia: No hernia is present.  Musculoskeletal: Normal  range of motion.  Skin:    General: Skin is warm and dry.  Neurological:     Mental Status: She is alert and oriented to person, place, and time.     Cranial Nerves: No cranial nerve deficit.     Sensory: No sensory deficit.     Motor: No abnormal muscle tone.     Coordination: Coordination normal.  Psychiatric:        Behavior: Behavior normal.        Thought Content: Thought content normal.        Judgment: Judgment normal.      ED Treatments / Results  Labs (all labs ordered are listed, but only abnormal results are displayed) Labs Reviewed  CBC - Abnormal; Notable for the following components:      Result Value   RBC 5.61 (*)    MCV 78.8 (*)    MCH 25.8 (*)    All other components within normal limits  URINALYSIS, ROUTINE W REFLEX MICROSCOPIC - Abnormal; Notable for the following components:   Color, Urine STRAW (*)    Glucose, UA >=500 (*)    Ketones, ur 5 (*)    Leukocytes,Ua TRACE (*)    All other components within normal limits  COMPREHENSIVE METABOLIC PANEL - Abnormal; Notable for the following components:   Sodium 131 (*)    Glucose, Bld 502 (*)    BUN 24 (*)    All other components within normal limits  CBG MONITORING, ED - Abnormal; Notable for the following components:   Glucose-Capillary 527 (*)    All other components within normal limits  CBG MONITORING, ED - Abnormal; Notable for the following components:   Glucose-Capillary 489 (*)    All other  components within normal limits  CBG MONITORING, ED - Abnormal; Notable for the following components:   Glucose-Capillary 364 (*)    All other components within normal limits  CBG MONITORING, ED - Abnormal; Notable for the following components:   Glucose-Capillary 359 (*)    All other components within normal limits  CBG MONITORING, ED - Abnormal; Notable for the following components:   Glucose-Capillary 273 (*)    All other components within normal limits  LIPASE, BLOOD    EKG None  Radiology No results found.  Procedures Procedures (including critical care time)  Medications Ordered in ED Medications  sodium chloride 0.9 % bolus 1,000 mL (0 mLs Intravenous Stopped 03/13/19 1910)  insulin aspart (novoLOG) injection 5 Units (5 Units Subcutaneous Given 03/13/19 1936)     Initial Impression / Assessment and Plan / ED Course  I have reviewed the triage vital signs and the nursing notes.  Pertinent labs & imaging results that were available during my care of the patient were reviewed by me and considered in my medical decision making (see chart for details).  Clinical Course as of Mar 12 2244  Wed Mar 13, 2019  1837 Normal except presence of glucose, ketones, leukocytes  Urinalysis, Routine w reflex microscopic(!) [EW]  1837 Normal  Lipase, blood [EW]  1837 Elevated  CBG monitoring, ED(!) [EW]  1837 Normal except sodium low, glucose high  Comprehensive metabolic panel(!!) [EW]  [id number] Normal except MCV low  CBC(!) [EW]    Clinical Course User Index [EW] Daleen Bo, MD        Patient Vitals for the past 24 hrs:  BP Temp Temp src Pulse Resp SpO2 Height Weight  03/13/19 2236 140/85 - - 64 18 93 % - -  03/13/19 2030 (!) 136/93 - - 76 18 96 % - -  03/13/19 2000 (!) 153/95 - - 74 18 100 % - -  03/13/19 1900 (!) 148/92 - - (!) 57 18 99 % - -  03/13/19 1851 (!) 147/92 - - 80 16 96 % - -  03/13/19 1609 - - - - - - 5\' 4"  (1.626 m) 83.9 kg  03/13/19 1607 (!) 144/90  98.5 F (36.9 C) Oral 84 16 100 % - -    10:42 PM Reevaluation with update and discussion. After initial assessment and treatment, an updated evaluation reveals patient is comfortable now she is tolerating oral liquids.  She admits to dietary indiscretions recently.  She states she feels better at this time.  She has no further complaints.Daleen Bo   Medical Decision Making: Evaluation consistent hyperglycemia likely dietary related.  Mild hyponatremia secondary to high blood sugar.  No evidence for unstable metabolic status.  Hemodynamic instability.  She is stable for discharge to outpatient management.  CRITICAL CARE-no Performed by: Daleen Bo  Nursing Notes Reviewed/ Care Coordinated Applicable Imaging Reviewed Interpretation of Laboratory Data incorporated into ED treatment  The patient appears reasonably screened and/or stabilized for discharge and I doubt any other medical condition or other [facility] requiring further screening, evaluation, or treatment in the ED at this time prior to discharge.  Plan: Home Medications-continue usual; Home Treatments-low-carb diet with plenty of water; return here if the recommended treatment, does not improve the symptoms; Recommended follow up-PCP follow-up as needed   Final Clinical Impressions(s) / ED Diagnoses   Final diagnoses:  Hyperglycemia    ED Discharge Orders    None       Daleen Bo, MD 03/13/19 2245

## 2019-03-13 NOTE — ED Triage Notes (Signed)
Patient c/o nausea and upper abdominal pain since this AM. Patient states her CBG was 470 at home.

## 2019-03-13 NOTE — ED Notes (Signed)
Pt ambulated to bathroom with a steady gait. No assistance required.

## 2019-03-20 ENCOUNTER — Ambulatory Visit (INDEPENDENT_AMBULATORY_CARE_PROVIDER_SITE_OTHER): Payer: BC Managed Care – PPO | Admitting: Family Medicine

## 2019-03-20 ENCOUNTER — Other Ambulatory Visit: Payer: Self-pay

## 2019-03-20 ENCOUNTER — Encounter: Payer: Self-pay | Admitting: Family Medicine

## 2019-03-20 VITALS — BP 130/70 | HR 83 | Temp 97.6°F | Resp 12 | Ht 64.0 in | Wt 195.6 lb

## 2019-03-20 DIAGNOSIS — Z0000 Encounter for general adult medical examination without abnormal findings: Secondary | ICD-10-CM | POA: Diagnosis not present

## 2019-03-20 DIAGNOSIS — I10 Essential (primary) hypertension: Secondary | ICD-10-CM

## 2019-03-20 DIAGNOSIS — Z23 Encounter for immunization: Secondary | ICD-10-CM

## 2019-03-20 DIAGNOSIS — E1151 Type 2 diabetes mellitus with diabetic peripheral angiopathy without gangrene: Secondary | ICD-10-CM

## 2019-03-20 LAB — COMPREHENSIVE METABOLIC PANEL
ALT: 16 U/L (ref 0–35)
AST: 14 U/L (ref 0–37)
Albumin: 3.8 g/dL (ref 3.5–5.2)
Alkaline Phosphatase: 87 U/L (ref 39–117)
BUN: 24 mg/dL — ABNORMAL HIGH (ref 6–23)
CO2: 28 mEq/L (ref 19–32)
Calcium: 9.7 mg/dL (ref 8.4–10.5)
Chloride: 97 mEq/L (ref 96–112)
Creatinine, Ser: 0.95 mg/dL (ref 0.40–1.20)
GFR: 72.64 mL/min (ref >60.00)
Glucose, Bld: 251 mg/dL — ABNORMAL HIGH (ref 70–99)
Potassium: 4 mEq/L (ref 3.5–5.1)
Sodium: 133 mEq/L — ABNORMAL LOW (ref 135–145)
Total Bilirubin: 0.4 mg/dL (ref 0.2–1.2)
Total Protein: 7.2 g/dL (ref 6.0–8.3)

## 2019-03-20 LAB — HEMOGLOBIN A1C: Hgb A1c MFr Bld: 13.2 % — ABNORMAL HIGH (ref 4.6–6.5)

## 2019-03-20 LAB — MICROALBUMIN / CREATININE URINE RATIO
Creatinine,U: 74.7 mg/dL
Microalb Creat Ratio: 3.9 mg/g (ref 0.0–30.0)
Microalb, Ur: 2.9 mg/dL — ABNORMAL HIGH (ref 0.0–1.9)

## 2019-03-20 NOTE — Patient Instructions (Addendum)
Today you have you routine preventive visit. A few things to remember from today's visit:   Routine general medical examination at a health care facility  Essential hypertension - Plan: Comprehensive metabolic panel  DM (diabetes mellitus), type 2 with peripheral vascular complications ([place]) - Plan: Comprehensive metabolic panel, Hemoglobin A1c   Please arrange appointment with Dr Loanne Drilling.  At least 150 minutes of moderate exercise per week, daily brisk walking for 15-30 min is a good exercise option. Healthy diet low in saturated (animal) fats and sweets and consisting of fresh fruits and vegetables, lean meats such as fish and white chicken and whole grains.  These are some of recommendations for screening depending of age and risk factors:   - Vaccines:  Tdap vaccine every 10 years.  Shingles vaccine recommended at age 73, could be given after 59 years of age but not sure about insurance coverage.   Pneumonia vaccines:Pneumovax at 62. Sometimes Pneumovax is giving earlier if history of smoking, lung disease,diabetes,kidney disease among some.   Cervical cancer prevention:  Hysterectomy.   -Breast cancer: Mammogram: There is disagreement between experts about when to start screening in low risk asymptomatic female but recent recommendations are to start screening at 62 and not later than 59 years old , every 1-2 years and after 59 yo q 2 years. Screening is recommended until 59 years old but some women can continue screening depending of healthy issues.   Colon cancer screening: starts at 59 years old until 59 years old. Due in 03/2020.  Also recommended:  1. Dental visit- Brush and floss your teeth twice daily; visit your dentist twice a year. 2. Eye doctor- Get an eye exam at least every 2 years. 3. Helmet use- Always wear a helmet when riding a bicycle, motorcycle, rollerblading or skateboarding. 4. Safe sex- If you may be exposed to sexually transmitted infections, use  a condom. 5. Seat belts- Seat belts can save your live; always wear one. 6. Smoke/Carbon Monoxide detectors- These detectors need to be installed on the appropriate level of your home. Replace batteries at least once a year. 7. Skin cancer- When out in the sun please cover up and use sunscreen 15 SPF or higher. 8. Violence- If anyone is threatening or hurting you, please tell your healthcare provider.  9. Drink alcohol in moderation- Limit alcohol intake to one drink or less per day. Never drink and drive.

## 2019-03-20 NOTE — Progress Notes (Signed)
HPI:   [name] is a 58 y.o. female, who is here today for her routine physical.  Last CPE: 2013.  Regular exercise 3 or more time per week: Not consistently. Following a healthy diet: She is "trying", not consistently. She lives alone, family lives close by.  Chronic medical problems: DM II,polyneuropathy,HTN,HLD, CVA,IBS,and OA among some.  Pap smear: S/P hysterectomy in 2003. Hx of abnormal pap smears: Negative.  Immunization History  Administered Date(s) Administered  . Influenza Split 05/13/2011  . Influenza Whole 03/06/2009  . Influenza,inj,Quad PF,6+ Mos 02/01/2013, 06/06/2014, 03/16/2017, 02/26/2018, 03/20/2019  . Pneumococcal Polysaccharide-23 02/26/2018  . Tdap 03/20/2019    Mammogram: 01/2017. Colonoscopy: 03/2010. DEXA: N/A  Hep C screening 01/2018 and NR.  Concerns today: She has not receive Lantus , she will run out in a few days. She receives Lantus through pt assistance program. I have not received forms at this time.  She has not followed with endocrinologist as recommended. BS's, fasting 170-190's. She is not checking post preprandial glucose. She is on Lantus 120 U daily and Novalog 15 U before meals + correction dose depending of glucose numbers.  Feet numbness and hands tingling sensation.  Denies polydipsia,polyuria, or polyphagia.   Lab Results  Component Value Date   HGBA1C 13.6 (H) 07/20/2018    Lab Results  Component Value Date   MICROALBUR 3.8 (H) 02/26/2018   MICROALBUR 1.7 09/11/2015   HTN on Amlodipine 10 mg and Lisinopril-HCTZ 20-25 mg daily. CVA, she is on Plavix 75 mg daily. HLD on Pravastatin 40 mg daily.  Lab Results  Component Value Date   CHOL 213 (H) 07/20/2018   HDL 39.30 07/20/2018   LDLCALC 146 (H) 07/20/2018   LDLDIRECT 175.9 03/02/2012   TRIG 139.0 07/20/2018   CHOLHDL 5 07/20/2018     Review of Systems  Constitutional: Negative for appetite change, fatigue and fever.  HENT: Negative  for hearing loss, mouth sores, trouble swallowing and voice change.   Eyes: Negative for redness and visual disturbance.  Respiratory: Negative for cough, shortness of breath and wheezing.   Cardiovascular: Negative for chest pain and leg swelling.  Gastrointestinal: Negative for abdominal pain, nausea and vomiting.       No changes in bowel habits.  Endocrine: Negative for cold intolerance and heat intolerance.  Genitourinary: Negative for decreased urine volume, dysuria, hematuria, vaginal bleeding and vaginal discharge.  Musculoskeletal: Positive for arthralgias. Negative for gait problem and neck pain.  Skin: Negative for color change and rash.  Allergic/Immunologic: Positive for environmental allergies.  Neurological: Negative for seizures, syncope, weakness, numbness and headaches.  Psychiatric/Behavioral: Negative for confusion and sleep disturbance. The patient is not nervous/anxious.   All other systems reviewed and are negative.   Current Outpatient Medications on File Prior to Visit  Medication Sig Dispense Refill  . amLODipine (NORVASC) 10 MG tablet Take 1 tablet (10 mg total) by mouth daily. 90 tablet 3  . Aspirin Effervescent (ALKA-SELTZER EXTRA STRENGTH) 500 MG TBEF Take 500 mg by mouth daily as needed (cold symptoms).    [name] atorvastatin (LIPITOR) 40 MG tablet Take 1 tablet (40 mg total) by mouth daily. 90 tablet 3  . Blood Pressure Monitoring (BLOOD PRESSURE MONITOR AUTOMAT) DEVI 1 Device by Does not apply route daily. 1 Device 0  . clopidogrel (PLAVIX) 75 MG tablet Take 1 tablet (75 mg total) by mouth daily. 90 tablet 3  . FLUoxetine (PROZAC) 20 MG capsule Take 1 capsule by mouth once daily (Patient  taking differently: Take 20 mg by mouth daily. ) 90 capsule 1  . gabapentin (NEURONTIN) 300 MG capsule Take 1 capsule (300 mg total) by mouth 3 (three) times daily. 90 capsule 1  . glucose blood (CONTOUR NEXT TEST) test strip 1 each by Other route 2 (two) times daily. And lancets  2/day 200 each 3  . glucose blood (ONETOUCH VERIO) test strip USE TO CHECK BLOOD SUGAR TWICE A DAY AND PRN 100 each 6  . insulin aspart (NOVOLOG) 100 UNIT/ML FlexPen 15 U 5-10 min before meals or with meals.Extra Novalog insulin if sugars is over 150 as follow:Add 1 unit of NovoLog if your sugar is 150-200. Add 3 units of NovoLog if your sugar is 201-250.Add 5 units of NovoLog if your sugar is 251-300 Add 7 units of NovoLog if your sugar is over 301 15 mL 3  . Insulin Glargine (LANTUS) 100 UNIT/ML Solostar Pen Inject 120 Units into the skin every morning. And pen needles 1/day 15 pen 11  . lisinopril-hydrochlorothiazide (ZESTORETIC) 20-25 MG tablet Take 1 tablet by mouth daily. 90 tablet 2  . omeprazole (PRILOSEC) 20 MG capsule Take 1 capsule by mouth once daily 90 capsule 0  . omeprazole (PRILOSEC) 20 MG capsule Take 1 capsule (20 mg total) by mouth daily. 30 capsule 3  . ONE TOUCH LANCETS MISC USE TO CHECK BLOOD SUGAR TWICE A DAY AND PRN 100 each 6  . pravastatin (PRAVACHOL) 40 MG tablet Take 1 tablet by mouth once daily 90 tablet 0  . triamcinolone ointment (KENALOG) 0.5 % Apply topically 2 (two) times daily as needed. 15 g 0   No current facility-administered medications on file prior to visit.      Past Medical History:  Diagnosis Date  . Abnormality of gait 05/10/2010  . BACK PAIN 11/14/2008  . CEREBROVASCULAR DISEASE 08/05/2008  . DIABETES MELLITUS, TYPE II 07/15/2008  . Diplopia 07/15/2008  . ECZEMA, ATOPIC 04/03/2009  . Guillain-Barre ([place])   . HYPERLIPIDEMIA 03/06/2009  . HYPERTENSION 07/15/2008  . Stroke ([place]) 2010   x2   . Vertebral artery stenosis     Past Surgical History:  Procedure Laterality Date  . ABDOMINAL HYSTERECTOMY    . DILATION AND CURETTAGE OF UTERUS    . FOOT SURGERY      No Known Allergies  Family History  Problem Relation Age of Onset  . Diabetes Sister   . Asthma Other   . Stroke Other   . Hypertension Other   . Diabetes Mother   . Stroke Mother    . Cancer Father   . Breast cancer Neg Hx     Social History   Socioeconomic History  . Marital status: Single    Spouse name: Not on file  . Number of children: Not on file  . Years of education: Not on file  . Highest education level: Not on file  Occupational History  . Not on file  Social Needs  . Financial resource strain: Not on file  . Food insecurity    Worry: Not on file    Inability: Not on file  . Transportation needs    Medical: Not on file    Non-medical: Not on file  Tobacco Use  . Smoking status: Never Smoker  . Smokeless tobacco: Never Used  Substance and Sexual Activity  . Alcohol use: No  . Drug use: No  . Sexual activity: Not on file  Lifestyle  . Physical activity    Days per week: Not  on file    Minutes per session: Not on file  . Stress: Not on file  Relationships  . Social Herbalist on phone: Not on file    Gets together: Not on file    Attends religious service: Not on file    Active member of club or organization: Not on file    Attends meetings of clubs or organizations: Not on file    Relationship status: Not on file  Other Topics Concern  . Not on file  Social History Narrative  . Not on file     Vitals:   03/20/19 1004  BP: 130/70  Pulse: 83  Resp: 12  Temp: 97.6 F (36.4 C)  SpO2: 98%   Body mass index is 33.57 kg/m.   Wt Readings from Last 3 Encounters:  03/20/19 195 lb 9.6 oz (88.7 kg)  03/13/19 185 lb (83.9 kg)  10/10/18 180 lb (81.6 kg)    Physical Exam  Nursing note and vitals reviewed. Constitutional: She is oriented to person, place, and time. She appears well-developed. No distress.  HENT:  Head: Normocephalic and atraumatic.  Right Ear: Hearing, tympanic membrane, external ear and ear canal normal.  Left Ear: Hearing, tympanic membrane, external ear and ear canal normal.  Mouth/Throat: Uvula is midline, oropharynx is clear and moist and mucous membranes are normal.  Eyes: Pupils are equal,  round, and reactive to light. Conjunctivae and EOM are normal.  Neck: No tracheal deviation present. No thyromegaly present.  Cardiovascular: Normal rate and regular rhythm.  No murmur heard. Pulses:      Dorsalis pedis pulses are 2+ on the right side and 2+ on the left side.  Respiratory: Effort normal and breath sounds normal. No respiratory distress. No breast tenderness.  GI: Soft. She exhibits no mass. There is no hepatomegaly. There is no abdominal tenderness.  Genitourinary:    Genitourinary Comments: Breast: No masses,nipple discharge,or masses bilateral.   Musculoskeletal:        General: No edema.     Comments: No major deformity or signs of synovitis appreciated.  Lymphadenopathy:    She has no cervical adenopathy.    She has no axillary adenopathy.       Right: No supraclavicular adenopathy present.       Left: No supraclavicular adenopathy present.  Neurological: She is alert and oriented to person, place, and time. She has normal strength. No cranial nerve deficit. Coordination and gait normal.  Reflex Scores:      Bicep reflexes are 2+ on the right side and 2+ on the left side.      Patellar reflexes are 2+ on the right side and 2+ on the left side. Skin: Skin is warm. No rash noted. No erythema.  Psychiatric: She has a normal mood and affect. Cognition and memory are normal.  Well groomed, good eye contact.   ASSESSMENT AND PLAN:  Ms. Rheva Ventrice was here today annual physical examination.  Orders Placed This Encounter  Procedures  . Flu Vaccine QUAD 36+ mos IM  . Tdap vaccine greater than or equal to 7yo IM  . Comprehensive metabolic panel  . Hemoglobin A1c  . Microalbumin / creatinine urine ratio  . Fructosamine    Lab Results  Component Value Date   HGBA1C 13.2 (H) 03/20/2019   Lab Results  Component Value Date   CREATININE 0.95 03/20/2019   BUN 24 (H) 03/20/2019   NA 133 (L) 03/20/2019   K 4.0 03/20/2019  CL 97 03/20/2019   CO2 28 03/20/2019    Lab Results  Component Value Date   ALT 16 03/20/2019   AST 14 03/20/2019   ALKPHOS 87 03/20/2019   BILITOT 0.4 03/20/2019   Lab Results  Component Value Date   MICROALBUR 2.9 (H) 03/20/2019   MICROALBUR 3.8 (H) 02/26/2018    Routine general medical examination at a health care facility We discussed the importance of regular physical activity and healthy diet for prevention of chronic illness and/or complications. Preventive guidelines reviewed. Vaccination updated.  Ca++ and vit D supplementation recommended. Next CPE in a year.  Essential hypertension BP adequately controlled. No changes in current management.  -     Comprehensive metabolic panel  DM (diabetes mellitus), type 2 with peripheral vascular complications ([place]) Problem has been poorly controlled. Educated about possible complications from poorly controlled glucose. She will send Lantus form to the office to be completed. Strongly recommend following with endocrinologist, she needs to arrange appt.  No changes in current management. Regular exercise and healthy diet with avoidance of added sugar food intake is an important part of treatment and recommended. Annual eye exam (overdue), periodic dental and foot care recommended. F/U in 5-6 months  Need for immunization against influenza -     Flu Vaccine QUAD 36+ mos IM  Need for Tdap vaccination -     Tdap vaccine greater than or equal to 7yo IM    Return in 4 months (on 07/18/2019) for HLD,HTN.    Constantin Hillery G. [name], MD  [facility]. [place] office.

## 2019-03-23 ENCOUNTER — Encounter: Payer: Self-pay | Admitting: Family Medicine

## 2019-03-25 LAB — FRUCTOSAMINE: Fructosamine: 511 umol/L — ABNORMAL HIGH (ref 205–285)

## 2019-03-26 ENCOUNTER — Other Ambulatory Visit: Payer: Self-pay

## 2019-03-26 DIAGNOSIS — Z20822 Contact with and (suspected) exposure to covid-19: Secondary | ICD-10-CM

## 2019-03-28 LAB — NOVEL CORONAVIRUS, NAA: SARS-CoV-2, NAA: NOT DETECTED

## 2019-03-29 ENCOUNTER — Telehealth: Payer: Self-pay | Admitting: Family Medicine

## 2019-03-29 NOTE — Telephone Encounter (Signed)
Pt calling to get COVID results.  Made pt aware they are negative.

## 2019-04-05 ENCOUNTER — Telehealth: Payer: Self-pay | Admitting: Family Medicine

## 2019-04-05 NOTE — Telephone Encounter (Signed)
Patient is calling checking status. Patient also states you check online for the form. [email or web address]  Call back [phone]

## 2019-04-05 NOTE — Telephone Encounter (Signed)
Patient calling back checking on the status if [employer] forms were received.  Copied from [place] [phone]. Topic: General - Inquiry >> Apr 05, 2019  3:05 PM Reyne Dumas L wrote: Reason for CRM:   Pt states that [employer] was supposed to be faxing the office information for her to continue getting insulin and pt wants to make sure that has arrived.

## 2019-04-08 ENCOUNTER — Encounter: Payer: Self-pay | Admitting: Family Medicine

## 2019-04-08 ENCOUNTER — Telehealth: Payer: Self-pay

## 2019-04-08 ENCOUNTER — Other Ambulatory Visit: Payer: Self-pay | Admitting: Family Medicine

## 2019-04-08 DIAGNOSIS — Z1231 Encounter for screening mammogram for malignant neoplasm of breast: Secondary | ICD-10-CM

## 2019-04-08 NOTE — Telephone Encounter (Signed)
Form faxed & received confirmation page confirming fax went through.

## 2019-04-08 NOTE — Telephone Encounter (Signed)
Copied from [place] [phone]. Topic: General - Inquiry >> Apr 05, 2019  3:05 PM Reyne Dumas L wrote: Reason for CRM:   Pt states that [employer] was supposed to be faxing the office information for her to continue getting insulin and pt wants to make sure that has arrived.

## 2019-04-08 NOTE — Telephone Encounter (Signed)
lmovm for pt letting her know we received the form.

## 2019-04-08 NOTE — Telephone Encounter (Signed)
Letter printed & faxed to Christine at the number provided. Received confirmation that fax went through.

## 2019-04-08 NOTE — Telephone Encounter (Signed)
Copied from [place] [phone]. Topic: General - Other >> Apr 08, 2019 12:12 PM Rainey Pines A wrote: Patient stated that she needs documentation stating that she has quarantined for 14 days  faxed to employer at [phone]  Attention Bunnie Philips. Please advise

## 2019-04-12 ENCOUNTER — Telehealth: Payer: Self-pay

## 2019-04-12 NOTE — Telephone Encounter (Signed)
lmovm for pt letting her know that we have received her insulin and she is able to come pick up whenever she is ready.

## 2019-04-16 ENCOUNTER — Telehealth: Payer: Self-pay | Admitting: Family Medicine

## 2019-04-16 NOTE — Telephone Encounter (Signed)
Recommend following a low-salt diet. Decrease salt use by 50% when cooking and avoid adding salt to meals (saltshaker). Thanks, BJ

## 2019-04-16 NOTE — Telephone Encounter (Signed)
Patient called and would like to talk to pcp or CMA about her salt issue in her body. She wants to know if there is something that she can take to reduce the salt in her system. Please call patient back, thanks.

## 2019-04-16 NOTE — Telephone Encounter (Signed)
Message Routed to PCP CMA 

## 2019-04-17 ENCOUNTER — Encounter: Payer: Self-pay | Admitting: Podiatry

## 2019-04-17 ENCOUNTER — Other Ambulatory Visit: Payer: Self-pay

## 2019-04-17 ENCOUNTER — Ambulatory Visit (INDEPENDENT_AMBULATORY_CARE_PROVIDER_SITE_OTHER): Payer: BC Managed Care – PPO | Admitting: Podiatry

## 2019-04-17 DIAGNOSIS — E1149 Type 2 diabetes mellitus with other diabetic neurological complication: Secondary | ICD-10-CM | POA: Diagnosis not present

## 2019-04-17 DIAGNOSIS — L84 Corns and callosities: Secondary | ICD-10-CM | POA: Diagnosis not present

## 2019-04-17 DIAGNOSIS — E1140 Type 2 diabetes mellitus with diabetic neuropathy, unspecified: Secondary | ICD-10-CM | POA: Diagnosis not present

## 2019-04-17 NOTE — Telephone Encounter (Signed)
I called and spoke with pt. We went over the information below & pt verbalized understanding. She will come by today and pick up her insulin.

## 2019-05-01 NOTE — Progress Notes (Signed)
Subjective:   Patient ID: [name], female   DOB: 58 y.o.   MRN: [id number]   HPI Patient presents with pain in both feet mostly around third metatarsals and states that also in the legs at times patient states is been going on for several years and patient is concerned about the pain experiencing.  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

## 2019-05-09 ENCOUNTER — Ambulatory Visit: Payer: Self-pay | Admitting: *Deleted

## 2019-05-09 NOTE — Telephone Encounter (Signed)
Message Routed to PCP CMA 

## 2019-05-09 NOTE — Telephone Encounter (Signed)
Contacted pt regarding her concerns; the pt stated she took her gabapentin (NEURONTIN) 300 MG capsule and she had some shakiness and nervousness. She did not eat before taking it and she is not sure if that caused the side effects; she says that when she woke up at 0700 (shakiness and dizziness, tingling in tongue); the pt took the medication at at 0200 05/09/19; she took the medication on an empty stomach, and felt better after she ate; recommendations made per nurse triage protocol; the pt says that she does not want to go tot the ED; pt also advised to call EMS if she develops hives, SOB, difficulty breathing, facial/ tongue/lip swelling, or difficulty swallowing; she verbalized understanding; also spoke with [place], [facility], she requested this information be routed to the office for review; pt notified, and can be contacted at [phone]; will route per request.  Reason for Disposition . [1] Caller has URGENT medication question about med that PCP or specialist prescribed AND [2] triager unable to answer question  Answer Assessment - Initial Assessment Questions 1.   NAME of MEDICATION: "What medicine are you calling about?"     gabapentin 2.   QUESTION: "What is your question?"     Side effecst of gabapentin 3.   PRESCRIBING HCP: "Who prescribed it?" Reason: if prescribed by specialist, call should be referred to that group.     Dr Betty [name] 4. SYMPTOMS: "Do you have any symptoms?"     Tongue tingling, shakiness, and nervousness 5. SEVERITY: If symptoms are present, ask "Are they mild, moderate or severe?"     mild 6.  PREGNANCY:  "Is there any chance that you are pregnant?" "When was your last menstrual period?"  no  Answer Assessment - Initial Assessment Questions 1. MAIN SYMPTOM: "What is your main symptom?" "How bad is it?"       Tongue tingling and nervousness 2. RESPIRATORY STATUS: "Are you having difficulty breathing?"  (e.g., yes/no, wheezing, unable to complete a  sentence)     no 3. SWALLOWING: "Can you swallow?" (e.g., yes/no, food, fluid, saliva)      yes 4. VASCULAR STATUS: "Are you feeling weak?" If so, ask: "Can you stand and walk normally?"     no 5. ONSET: "When did the reaction start?" (Minutes or hours ago)    Noticed when she woke up at 0700 6. SUBSTANCE: "What are you reacting to?" "When did the contact occur?"      Gabapentine 300 mg x 1 pill 7. PREVIOUS REACTION: "Have you ever reacted to it before?" If so, ask: "What happened that time?"     no 8. EPINEPHRINE: "Do you have an epinephrine autoinjector (e.g., EpiPen)?"   no  Protocols used: MEDICATION QUESTION CALL-A-AH, ANAPHYLAXIS-A-AH

## 2019-05-09 NOTE — Telephone Encounter (Signed)
Spoke with patient. Patient verbalized she is much better and her sx has subsided. Patient reports she will not take the medication on an empty stomach anymore

## 2019-05-10 NOTE — Telephone Encounter (Signed)
FYI

## 2019-05-22 LAB — HM DIABETES EYE EXAM

## 2019-05-29 ENCOUNTER — Other Ambulatory Visit: Payer: Self-pay

## 2019-05-29 ENCOUNTER — Ambulatory Visit
Admission: RE | Admit: 2019-05-29 | Discharge: 2019-05-29 | Disposition: A | Discharge status: Home / Self Care | Payer: BC Managed Care – PPO | Source: Ambulatory Visit | Attending: Family Medicine | Admitting: Family Medicine

## 2019-05-29 DIAGNOSIS — Z1231 Encounter for screening mammogram for malignant neoplasm of breast: Secondary | ICD-10-CM

## 2019-06-03 ENCOUNTER — Other Ambulatory Visit: Payer: Self-pay | Admitting: Family Medicine

## 2019-06-03 DIAGNOSIS — R928 Other abnormal and inconclusive findings on diagnostic imaging of breast: Secondary | ICD-10-CM

## 2019-06-06 ENCOUNTER — Other Ambulatory Visit: Payer: BC Managed Care – PPO

## 2019-06-12 ENCOUNTER — Other Ambulatory Visit: Payer: Self-pay | Admitting: Family Medicine

## 2019-06-12 ENCOUNTER — Ambulatory Visit
Admission: RE | Admit: 2019-06-12 | Discharge: 2019-06-12 | Disposition: A | Discharge status: Home / Self Care | Payer: BC Managed Care – PPO | Source: Ambulatory Visit | Attending: Family Medicine | Admitting: Family Medicine

## 2019-06-12 ENCOUNTER — Other Ambulatory Visit: Payer: Self-pay

## 2019-06-12 ENCOUNTER — Other Ambulatory Visit: Payer: BC Managed Care – PPO

## 2019-06-12 DIAGNOSIS — R928 Other abnormal and inconclusive findings on diagnostic imaging of breast: Secondary | ICD-10-CM

## 2019-06-26 ENCOUNTER — Ambulatory Visit
Admission: RE | Admit: 2019-06-26 | Discharge: 2019-06-26 | Disposition: A | Discharge status: Home / Self Care | Payer: BC Managed Care – PPO | Source: Ambulatory Visit | Attending: Family Medicine | Admitting: Family Medicine

## 2019-06-26 ENCOUNTER — Other Ambulatory Visit: Payer: Self-pay

## 2019-06-26 ENCOUNTER — Other Ambulatory Visit: Payer: Self-pay | Admitting: Family Medicine

## 2019-06-26 ENCOUNTER — Telehealth: Payer: Self-pay | Admitting: Family Medicine

## 2019-06-26 DIAGNOSIS — R928 Other abnormal and inconclusive findings on diagnostic imaging of breast: Secondary | ICD-10-CM

## 2019-06-26 NOTE — Telephone Encounter (Signed)
I do not see any contraindication to get COVID-19 vaccine. Given her medical history benefit is greater than risk. I recommend COVID-19 vaccine. If more concerns/questions, we can arrange a virtual visit. Thanks, BJ

## 2019-06-26 NOTE — Telephone Encounter (Signed)
I called and left pt a detailed voicemail with the info below & advised her to call back to set up a virtual visit with pcp with any further questions.

## 2019-06-26 NOTE — Telephone Encounter (Signed)
Pt would like to know if it is okay for her to get the COVID vaccine she would like to speak with Dr. [name] with some concerns she has with it.  Pt stated that she is scheduled to have the vaccine on Saturday.  Pt would like to have a call back today if no answer please leave a message.  Pt declined a call

## 2019-06-28 ENCOUNTER — Telehealth: Payer: Self-pay | Admitting: Family Medicine

## 2019-06-28 DIAGNOSIS — C8334 Diffuse large B-cell lymphoma, lymph nodes of axilla and upper limb: Secondary | ICD-10-CM

## 2019-06-28 LAB — SURGICAL PATHOLOGY

## 2019-06-28 NOTE — Telephone Encounter (Signed)
Pt is scheduled to get the COVID vaccine tomorrow. She found out today that she needs to see an Materials engineer. She is needing a referral to Oncology.  She is also wondering if it is still ok to get the vaccine even though she needs to see an Oncologist?   Pt can be reached at [phone] if she can receive a call today if possible

## 2019-06-28 NOTE — Addendum Note (Signed)
Addended by: [name], Caleen Taaffe G on: 06/28/2019 05:11 PM   Modules accepted: Orders

## 2019-06-28 NOTE — Telephone Encounter (Signed)
Should she still get the vaccine tomorrow or wait? She's scheduled for a virtual visit with you on Monday.

## 2019-06-28 NOTE — Telephone Encounter (Addendum)
Tried to contact Dr Owens Shark. Left a message with my cell phone number if needs to discuss [name] situation before Monday. Otherwise I can try again Monday 07/01/19. Karee Forge [name], MD

## 2019-06-28 NOTE — Telephone Encounter (Signed)
Dr. Owens Shark did a biopsy on pt's axillary lymphoids on wed and came back showing probable lymphoma. She would like to speak to [name] regarding it.  Dr. Owens Shark can be reached at [phone]

## 2019-06-28 NOTE — Telephone Encounter (Signed)
She can go ahead and get COVID 19 vaccine. Thanks, BJ

## 2019-06-28 NOTE — Telephone Encounter (Addendum)
Spoke with Dr. Owens Shark. 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 Bilateral lymph nodes seem to be involved.  Appointment with oncologist will be arranged. Mayfield Schoene [name], MD

## 2019-06-28 NOTE — Telephone Encounter (Signed)
I called pt and left her a voicemail letting her know it is okay to go ahead and get the covid 19 vaccine.

## 2019-07-01 ENCOUNTER — Telehealth (INDEPENDENT_AMBULATORY_CARE_PROVIDER_SITE_OTHER): Payer: BC Managed Care – PPO | Admitting: Family Medicine

## 2019-07-01 ENCOUNTER — Encounter: Payer: Self-pay | Admitting: Family Medicine

## 2019-07-01 DIAGNOSIS — I10 Essential (primary) hypertension: Secondary | ICD-10-CM | POA: Diagnosis not present

## 2019-07-01 DIAGNOSIS — E1142 Type 2 diabetes mellitus with diabetic polyneuropathy: Secondary | ICD-10-CM | POA: Diagnosis not present

## 2019-07-01 DIAGNOSIS — F419 Anxiety disorder, unspecified: Secondary | ICD-10-CM

## 2019-07-01 DIAGNOSIS — E1151 Type 2 diabetes mellitus with diabetic peripheral angiopathy without gangrene: Secondary | ICD-10-CM | POA: Diagnosis not present

## 2019-07-01 MED ORDER — INSULIN LISPRO 100 UNIT/ML ~~LOC~~ SOLN: SUBCUTANEOUS | 1 refills | Status: DC

## 2019-07-01 NOTE — Progress Notes (Signed)
Virtual Visit via Telephone Note  I connected with [name] on 07/01/19 at  4:00 PM EST by telephone and verified that I am speaking with the correct person using two identifiers.   I discussed the limitations, risks, security and privacy concerns of performing an evaluation and management service by telephone and the availability of in person appointments. I also discussed with the patient that there may be a patient responsible charge related to this service. The patient expressed understanding and agreed to proceed.  Location patient: home Location provider: work office Participants present for the call: patient, provider Patient did not have a visit in the prior 7 days to address this/these issue(s).   History of Present Illness: [name] is a 59 yo following on some chronic medical problems today. 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 Referral to oncologist has been placed.  She has not followed with endocrinologist. BS's 170's-200. No hypoglycemic events. Currently she is on Lantus 120 units daily. She has not taking NovoLog because of cost.  Lab Results  Component Value Date   HGBA1C 13.2 (H) 03/20/2019   Denies abdominal pain, nausea,vomiting, polydipsia,polyuria, or polyphagia.  Peripheral neuropathy, numbness and tingling in lower and upper extremities + foot pain (R>L).  Reporting great improvement with gabapentin 300 mg 3 times daily. She has tolerated medication well, she does not report any side effects.  Hypertension: Currently she is on lisinopril-HCTZ 20-25 mg daily She does not have a BP monitor to check BP at home. Negative for severe/frequent headache, visual changes, chest pain, dyspnea, palpitation, focal weakness, or edema.  Anxiety: Currently she is on paroxetine 20 mg daily. She feels like medication still helping. She has been under stress due to her financial situation. Negative for depressed mood.    Observations/Objective: Patient sounds cheerful and well on the phone. I do not appreciate any SOB. Speech and thought processing are grossly intact. Patient reported vitals:N/A  Assessment and Plan:  1. DM (diabetes mellitus), type 2 with peripheral vascular complications (HCC) Problem is now well controlled. We discussed possible complications of poorly controlled glucose. She will pick up samples of Humalog. No changes in Lantus dose. She is planning on rescheduling appointment with endocrinologist today. Instructed about warning signs.  - insulin lispro (HUMALOG) 100 UNIT/ML injection; 15 U 5-10 min before meals or with meals.Extra Humalog insulin if sugars is over 150 as follow:Add 1 unit of Humalog if your sugar is 150-200. Add 3 units of HumaLog if your sugar is 201-250.Add 5 units of Humalog if your sugar is 251-300 Add 7 units of Humalog if your sugar is over 301  Dispense: 10 mL; Refill: 1  2. Diabetic peripheral neuropathy associated with type 2 diabetes mellitus ([place]) Problem has improved with gabapentin 300 mg 3 times daily. No changes in current management. Appropriate foot care recommended.  3. Essential hypertension BP has been adequately controlled during prior visits. It will be ideal to check BP at home, so recommend to do so if she can get the BP monitor. Continue low-salt diet. No changes in current management.  4. Anxiety disorder, unspecified type Problem seems to be stable. Continue fluoxetine 20 mg daily.   Follow Up Instructions:  Return in about 3 months (around 10/01/2019) for HTN-HLD (fasting labs).  I did not refer this patient for an OV in the next 24 hours for this/these issue(s).  I discussed the assessment and treatment plan with the patient. [name] was provided an opportunity to ask questions and all  were answered. She agreed with the plan and demonstrated an understanding of the instructions.    I provided 8 minutes of  non-face-to-face time during this encounter.   Jaramie Bastos [name], MD

## 2019-07-03 NOTE — Progress Notes (Signed)
Patient is scheduled with Dr. [name] on 10/08/2019

## 2019-07-05 ENCOUNTER — Telehealth: Payer: Self-pay | Admitting: Hematology

## 2019-07-05 NOTE — Telephone Encounter (Signed)
Received a new pt referral from Dr. [name] for a non-hodgkin lymphoma. [name] has been cld and scheduled to see Dr. Irene Limbo on 3/16 at 11am. [name] is aware to arrive 15 minutes early.

## 2019-07-16 ENCOUNTER — Inpatient Hospital Stay: Payer: BC Managed Care – PPO

## 2019-07-16 ENCOUNTER — Telehealth: Payer: Self-pay | Admitting: Hematology

## 2019-07-16 ENCOUNTER — Inpatient Hospital Stay: Payer: BC Managed Care – PPO | Attending: Hematology | Admitting: Hematology

## 2019-07-16 ENCOUNTER — Other Ambulatory Visit: Payer: Self-pay

## 2019-07-16 VITALS — BP 184/91 | HR 70 | Temp 97.9°F | Resp 18 | Ht 64.0 in | Wt 197.4 lb

## 2019-07-16 DIAGNOSIS — C8300 Small cell B-cell lymphoma, unspecified site: Secondary | ICD-10-CM

## 2019-07-16 DIAGNOSIS — R718 Other abnormality of red blood cells: Secondary | ICD-10-CM | POA: Insufficient documentation

## 2019-07-16 DIAGNOSIS — C9110 Chronic lymphocytic leukemia of B-cell type not having achieved remission: Secondary | ICD-10-CM | POA: Insufficient documentation

## 2019-07-16 LAB — CBC WITH DIFFERENTIAL/PLATELET
Abs Immature Granulocytes: 0.01 10*3/uL (ref 0.00–0.07)
Basophils Absolute: 0 10*3/uL (ref 0.0–0.1)
Basophils Relative: 0 %
Eosinophils Absolute: 0.1 10*3/uL (ref 0.0–0.5)
Eosinophils Relative: 3 %
HCT: 41 % (ref 36.0–46.0)
Hemoglobin: 13.3 g/dL (ref 12.0–15.0)
Immature Granulocytes: 0 %
Lymphocytes Relative: 39 %
Lymphs Abs: 2.1 10*3/uL (ref 0.7–4.0)
MCH: 24.9 pg — ABNORMAL LOW (ref 26.0–34.0)
MCHC: 32.4 g/dL (ref 30.0–36.0)
MCV: 76.8 fL — ABNORMAL LOW (ref 80.0–100.0)
Monocytes Absolute: 0.7 10*3/uL (ref 0.1–1.0)
Monocytes Relative: 14 %
Neutro Abs: 2.4 10*3/uL (ref 1.7–7.7)
Neutrophils Relative %: 44 %
Platelets: 236 10*3/uL (ref 150–400)
RBC: 5.34 MIL/uL — ABNORMAL HIGH (ref 3.87–5.11)
RDW: 14.9 % (ref 11.5–15.5)
WBC: 5.5 10*3/uL (ref 4.0–10.5)
nRBC: 0 % (ref 0.0–0.2)

## 2019-07-16 LAB — CMP (CANCER CENTER ONLY)
ALT: 18 U/L (ref 0–44)
AST: 14 U/L — ABNORMAL LOW (ref 15–41)
Albumin: 3.5 g/dL (ref 3.5–5.0)
Alkaline Phosphatase: 97 U/L (ref 38–126)
Anion gap: 8 (ref 5–15)
BUN: 18 mg/dL (ref 6–20)
CO2: 27 mmol/L (ref 22–32)
Calcium: 9.5 mg/dL (ref 8.9–10.3)
Chloride: 103 mmol/L (ref 98–111)
Creatinine: 0.96 mg/dL (ref 0.44–1.00)
GFR, Est AFR Am: >60 mL/min (ref >60)
GFR, Estimated: >60 mL/min (ref >60)
Glucose, Bld: 368 mg/dL — ABNORMAL HIGH (ref 70–99)
Potassium: 4.6 mmol/L (ref 3.5–5.1)
Sodium: 138 mmol/L (ref 135–145)
Total Bilirubin: 0.3 mg/dL (ref 0.3–1.2)
Total Protein: 7.8 g/dL (ref 6.5–8.1)

## 2019-07-16 LAB — LACTATE DEHYDROGENASE: LDH: 188 U/L (ref 98–192)

## 2019-07-16 LAB — HEPATITIS B SURFACE ANTIGEN: Hepatitis B Surface Ag: NONREACTIVE

## 2019-07-16 LAB — HEPATITIS C ANTIBODY: HCV Ab: NONREACTIVE

## 2019-07-16 LAB — HEPATITIS B CORE ANTIBODY, TOTAL: Hep B Core Total Ab: NONREACTIVE

## 2019-07-16 NOTE — Telephone Encounter (Signed)
Scheduled appts per 3/16 los. Gave pt a print out of AVS.

## 2019-07-16 NOTE — Progress Notes (Signed)
HEMATOLOGY/ONCOLOGY CONSULTATION NOTE  Date of Service: 07/16/2019  Patient Care Team: [name], [name], MD as PCP - General (Family Medicine)  CHIEF COMPLAINTS/PURPOSE OF CONSULTATION:  Newly diagnosed Non-Hodgkin's Lymphoma  HISTORY OF PRESENTING ILLNESS:   [name] is a wonderful 59 y.o. 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 The pt reports that she is doing well overall.   The pt reports that her axillary lymphadenopathy was picked up during a routing Mammogram on 05/31/2019. Pt denies feeling any lumps or bumps in either axillary regions. Pt has felt the same for the last 6-12 months.   She has HTN and Diabetes. Her HTN is currently well-controlled but her Diabetes has been harder to control. Pt notes that "sometimes it's up and sometimes it's down". 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 She feels that the reaction on her left side is a little slower than her right side, but nothing that's noticeable. She denies any weakness, vision changes, or other residual symptoms from either stroke. 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 Pt made a full recovery and has no remaining symptoms. Her eczema is seasonal and not very bothersome. She denies any concerns for frequent infections. Pt has neuropathy in her hands/fingers and has been prescribed Gabapentin, which is helping.   Pt does not drink much alcohol and has never been a smoker. Her father passed from [place] and had a history of smoking. Her mother passed from a stroke last spring. 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 She is living alone and has one sister in the area.   Pt has had her first dose of the COVID19 vaccine and tolerated it well. She has the second dose scheduled.   Of note prior to the patient's visit today, pt has had  Flow Pathology ([id number]) completed on 06/26/2019 with results revealing "Monoclonal B-cell population identified."  Pt has had Right Axilla LN Bx ([id number]) completed on 06/26/2019 with results revealing "NON-HODGKIN B-CELL LYMPHOMA"   Pt has also had Mammogram ([id number]) completed on 05/31/2019 with results revealing "Further evaluation is suggested for prominent lymph nodes in the bilateral axilla."  Most recent lab results (03/13/2019) of CBC and CMP is as follows: all values are WNL except for RBC at 5.61, MCV at 78.8, MCH at 25.8, Sodium at 131, Glucose at 502, BUN at 24.  On review of systems, pt reports hard stools, tingling/numbness in fingers/hands and denies weakness, vision changes, fevers, chills, night sweats, SOB, fatigue, constipation, diarrhea and any other symptoms.   On PMHx the pt reports Guillain-Barr syndrome, Type II Diabetes, Atopic Eczema, HLD, HTN, Stroke x2, Neuropathy. On Social Hx the pt reports that she does not drink much alcohol and is a non-smoker.  MEDICAL HISTORY:  Past Medical History:  Diagnosis Date  . Abnormality of gait 05/10/2010  . BACK PAIN 11/14/2008  . CEREBROVASCULAR DISEASE 08/05/2008  . DIABETES MELLITUS, TYPE II 07/15/2008  . Diplopia 07/15/2008  . ECZEMA, ATOPIC 04/03/2009  . Guillain-Barre ([place])   . HYPERLIPIDEMIA 03/06/2009  . HYPERTENSION 07/15/2008  . Stroke ([place]) 2010   x2   . Vertebral artery stenosis     SURGICAL HISTORY: Past Surgical History:  Procedure Laterality Date  . ABDOMINAL HYSTERECTOMY    . DILATION AND CURETTAGE OF UTERUS    . FOOT SURGERY  SOCIAL HISTORY: Social History   Socioeconomic History  . Marital status: Single    Spouse name: Not on file  . Number of children: Not on file  . Years of education: Not on file  . Highest education level: Not on file  Occupational History  . Not on file  Tobacco Use  . Smoking status: Never Smoker  . Smokeless tobacco: Never Used  Substance and Sexual  Activity  . Alcohol use: No  . Drug use: No  . Sexual activity: Not on file  Other Topics Concern  . Not on file  Social History Narrative  . Not on file   Social Determinants of Health   Financial Resource Strain:   . Difficulty of Paying Living Expenses:   Food Insecurity:   . Worried About Charity fundraiser in the Last Year:   . Arboriculturist in the Last Year:   Transportation Needs:   . Film/video editor (Medical):   [name] Lack of Transportation (Non-Medical):   Physical Activity:   . Days of Exercise per Week:   . Minutes of Exercise per Session:   Stress:   . Feeling of Stress :   Social Connections:   . Frequency of Communication with Friends and Family:   . Frequency of Social Gatherings with Friends and Family:   . Attends Religious Services:   . Active Member of Clubs or Organizations:   . Attends Archivist Meetings:   [name] Marital Status:   Intimate Partner Violence:   . Fear of Current or Ex-Partner:   . Emotionally Abused:   [name] Physically Abused:   . Sexually Abused:     FAMILY HISTORY: Family History  Problem Relation Age of Onset  . Diabetes Sister   . Asthma Other   . Stroke Other   . Hypertension Other   . Diabetes Mother   . Stroke Mother   . Cancer Father   . Breast cancer Neg Hx     ALLERGIES:  has No Known Allergies.  MEDICATIONS:  Current Outpatient Medications  Medication Sig Dispense Refill  . amLODipine (NORVASC) 10 MG tablet Take 1 tablet (10 mg total) by mouth daily. 90 tablet 3  . Blood Pressure Monitoring (BLOOD PRESSURE MONITOR AUTOMAT) DEVI 1 Device by Does not apply route daily. 1 Device 0  . clopidogrel (PLAVIX) 75 MG tablet Take 1 tablet (75 mg total) by mouth daily. 90 tablet 3  . FLUoxetine (PROZAC) 20 MG capsule Take 1 capsule by mouth once daily (Patient taking differently: Take 20 mg by mouth daily. ) 90 capsule 1  . gabapentin (NEURONTIN) 300 MG capsule Take 1 capsule (300 mg total) by mouth 3 (three)  times daily. 90 capsule 1  . glucose blood (CONTOUR NEXT TEST) test strip 1 each by Other route 2 (two) times daily. And lancets 2/day 200 each 3  . glucose blood (ONETOUCH VERIO) test strip USE TO CHECK BLOOD SUGAR TWICE A DAY AND PRN 100 each 6  . Insulin Glargine (LANTUS) 100 UNIT/ML Solostar Pen Inject 120 Units into the skin every morning. And pen needles 1/day 15 pen 11  . insulin lispro (HUMALOG) 100 UNIT/ML injection 15 U 5-10 min before meals or with meals.Extra Humalog insulin if sugars is over 150 as follow:Add 1 unit of Humalog if your sugar is 150-200. Add 3 units of HumaLog if your sugar is 201-250.Add 5 units of Humalog if your sugar is 251-300 Add 7 units of Humalog if your  sugar is over 301 10 mL 1  . lisinopril-hydrochlorothiazide (ZESTORETIC) 20-25 MG tablet Take 1 tablet by mouth daily. 90 tablet 2  . omeprazole (PRILOSEC) 20 MG capsule Take 1 capsule by mouth once daily 90 capsule 0  . omeprazole (PRILOSEC) 20 MG capsule Take 1 capsule (20 mg total) by mouth daily. 30 capsule 3  . ONE TOUCH LANCETS MISC USE TO CHECK BLOOD SUGAR TWICE A DAY AND PRN 100 each 6  . pravastatin (PRAVACHOL) 40 MG tablet Take 1 tablet by mouth once daily 90 tablet 0  . triamcinolone ointment (KENALOG) 0.5 % Apply topically 2 (two) times daily as needed. 15 g 0   No current facility-administered medications for this visit.    REVIEW OF SYSTEMS:    10 Point review of Systems was done is negative except as noted above.  PHYSICAL EXAMINATION: ECOG PERFORMANCE STATUS: 0 - Asymptomatic  . Vitals:   07/16/19 1056  BP: (!) 184/91  Pulse: 70  Resp: 18  Temp: 97.9 F (36.6 C)  SpO2: 100%   Filed Weights   07/16/19 1056  Weight: 197 lb 6.4 oz (89.5 kg)   .Body mass index is 33.88 kg/m.  GENERAL:alert, in no acute distress and comfortable SKIN: no acute rashes, no significant lesions EYES: conjunctiva are pink and non-injected, sclera anicteric OROPHARYNX: MMM, no exudates, no  oropharyngeal erythema or ulceration NECK: supple, no JVD LYMPH:  no palpable lymphadenopathy in the cervical or inguinal regions. 1-2 cm lymph node in left axilla, couple small lymph nodes in right axilla.  LUNGS: clear to auscultation b/l with normal respiratory effort HEART: regular rate & rhythm ABDOMEN:  normoactive bowel sounds , non tender, not distended. Extremity: no pedal edema PSYCH: alert & oriented x 3 with fluent speech NEURO: no focal motor/sensory deficits  LABORATORY DATA:  I have reviewed the data as listed  . CBC Latest Ref Rng & Units 07/16/2019 03/13/2019 11/19/2016  WBC 4.0 - 10.5 K/uL 5.5 6.3 5.4  Hemoglobin 12.0 - 15.0 g/dL 13.3 14.5 12.9  Hematocrit 36.0 - 46.0 % 41.0 44.2 37.2  Platelets 150 - 400 K/uL 236 245 231    . CMP Latest Ref Rng & Units 07/16/2019 03/20/2019 03/13/2019  Glucose 70 - 99 mg/dL 368(H) 251(H) 502(HH)  BUN 6 - 20 mg/dL 18 24(H) 24(H)  Creatinine 0.44 - 1.00 mg/dL 0.96 0.95 0.86  Sodium 135 - 145 mmol/L 138 133(L) 131(L)  Potassium 3.5 - 5.1 mmol/L 4.6 4.0 4.2  Chloride 98 - 111 mmol/L 103 97 98  CO2 22 - 32 mmol/L _0 Calcium 8.9 - 10.3 mg/dL 9.5 9.7 10.0  Total Protein 6.5 - 8.1 g/dL 7.8 7.2 7.9  Total Bilirubin 0.3 - 1.2 mg/dL 0.3 0.4 0.4  Alkaline Phos 38 - 126 U/L 97 87 96  AST 15 - 41 U/L 14(L) 14 17  ALT 0 - 44 U/L _1 RADIOGRAPHIC STUDIES: I have personally reviewed the radiological images as listed and agreed with the findings in the report. [place] AXILLARY NODE CORE BIOPSY RIGHT  Addendum Date: 06/28/2019   ADDENDUM REPORT: 06/28/2019 16:35 ADDENDUM: Pathology : CyclinD1 is negative and thus the findings are consistent with chronic lymphocytic leukemia/small lymphocytic lymphoma. Patient will follow-up with Dr. [name]. I have not been able to reach Dr. [name] at the time of dictation to discuss the findings. I am available by pager at [phone]. Electronically Signed   By: Nolon Nations M.D.  On:  06/28/2019 16:35   Addendum Date: 06/28/2019   ADDENDUM REPORT: 06/28/2019 15:04 ADDENDUM: PATHOLOGY ADDENDUM: Pathology RIGHT axillary lymph node: Non-Hodgkin B-cell lymphoma. Findings are consistent with non-Hodgkin's B-cell lymphoma. The main differential consideration is chronic lymphocytic leukemia/small lymphocytic lymphoma. Immunohistochemistry stains to exclude mantle cell lymphoma are pending. Pathology concordance with imaging findings: Yes Recommendation: Medical oncology referral At the request of the patient, I spoke with her by telephone on 06/28/2019 at 14:11. She reports doing well after the biopsy . We discussed the pathology results and plan. Electronically Signed   By: Nolon Nations M.D.   On: 06/28/2019 15:04   Result Date: 06/28/2019 CLINICAL DATA:  Patient presents for ultrasound-guided core biopsy of RIGHT axillary lymph node. EXAM: [place] AXILLARY NODE CORE BIOPSY RIGHT COMPARISON:  Previous exam(s). PROCEDURE: I met with the patient and we discussed the procedure of ultrasound-guided biopsy, including benefits and alternatives. We discussed the high likelihood of a successful procedure. We discussed the risks of the procedure, including infection, bleeding, tissue injury, clip migration, and inadequate sampling. Informed written consent was given. The usual time-out protocol was performed immediately prior to the procedure. Using sterile technique and 1% Lidocaine as local anesthetic, under direct ultrasound visualization, a 12 gauge spring-loaded device was used to perform biopsy of enlarged lymph node in the LOWER RIGHT axilla using a LATERAL to MEDIAL approach. At the conclusion of the procedure spiral [facility] tissue marker clip was deployed into the biopsy cavity. Follow up 2 view mammogram was performed and dictated separately. IMPRESSION: Ultrasound guided biopsy of RIGHT axillary lymph node. No apparent complications. Electronically Signed: By: Nolon Nations M.D. On: 06/26/2019  14:35   MM CLIP PLACEMENT RIGHT  Result Date: 06/26/2019 CLINICAL DATA:  Status post ultrasound-guided core biopsy of enlarged RIGHT axillary lymph node. EXAM: DIAGNOSTIC RIGHT MAMMOGRAM POST ULTRASOUND BIOPSY COMPARISON:  Previous exam(s). FINDINGS: Mammographic images were obtained following ultrasound guided biopsy of RIGHT axillary lymph node and placement of a HydroMARK spiral shaped clip. The biopsy marking clip is in expected position at the site of biopsy. IMPRESSION: Appropriate positioning of the spiral HydroMARK shaped biopsy marking clip at the site of biopsy in the RIGHT axillary lymph. Final Assessment: Post Procedure Mammograms for Marker Placement Electronically Signed   By: Nolon Nations M.D.   On: 06/26/2019 14:36    ASSESSMENT & PLAN:    59 yo with   1) Newly Diagnosed CLL/SLL  Diagnosed incidentally as LNadenopathy noted on routine screening mamogram. No consitutional symptoms.  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 -Discussed 05/31/2019 Mammogram ([id number]) which revealed "Further evaluation is suggested for prominent lymph nodes in the bilateral axilla." -Discussed 06/26/2019 Flow Pathology ([id number]) which revealed "Monoclonal B-cell population identified." -Discussed Right Axilla LN Bx ([id number]) which revealed "NON-HODGKIN B-CELL LYMPHOMA"  -Advised pt that labs most consistent with CLL/SLL -Advised pt that she has a slow-growing indolent lymphoma . Discussed diagnosis, natural history, prognosis and treatment considerations -Advised pt that we would not typically move to treat unless CLL/SLL is creating: bulky disease that's bothersome, threatened end organs, significant cytopenias or constitutional symptoms -Advised pt  that treating early has not statistically lead to better outcomes  -Discussed symptoms for pt to be mindful of: including fevers, chills, night sweats, and unexpected  weight loss -Advised pt that CLL/SLL does put her at a slight increase for infections and to discuss this with other providers prior to surgeries or procedures.  -Recommend pt stay up to day with vaccinations including flu and pneumonia  -Recommend pt f/u as scheduled for her second dose of the COVID19 vaccine -Will get labs today -Will get CT C/A/P in 6 weeks -Will see back in 8 weeks with labs -Pt advised to contact if any concerns or changes in symptomology   FOLLOW UP: Labs today CT neck/chest/abd/pelvis in 6 weeks RTC with Dr Irene Limbo with labs in 8 weeks  All of the patients questions were answered with apparent satisfaction. The patient knows to call the clinic with any problems, questions or concerns.  I spent 38mns counseling the patient face to face. The total time spent in the appointment was 60 minutes and more than 50% was on counseling and direct patient cares.    GSullivan LoneMD MBrentwoodAAHIVMS SGramercy Surgery Center IncCSelect Specialty Hospital - Des MoinesHematology/Oncology Physician CScottsdale Eye Institute Plc (Office):       [phone](Work cell):  [phone](Fax):           [phone] 07/16/2019 4:53 PM  I, JYevette Edwards am acting as a scribe for Dr. GSullivan Lone   .I have reviewed the above documentation for accuracy and completeness, and I agree with the above. .Brunetta GeneraMD   ADDENDUM    Component     Latest Ref Rng & Units 07/16/2019  WBC     4.0 - 10.5 K/uL 5.5  RBC     3.87 - 5.11 MIL/uL 5.34 (H)  Hemoglobin     12.0 - 15.0 g/dL 13.3  HCT     36.0 - 46.0 % 41.0  MCV     80.0 - 100.0 fL 76.8 (L)  MCH     26.0 - 34.0 pg 24.9 (L)  MCHC     30.0 - 36.0 g/dL 32.4  RDW     11.5 - 15.5 % 14.9  Platelets     150 - 400 K/uL 236  nRBC     0.0 - 0.2 % 0.0  Neutrophils     % 44  NEUT#     1.7 - 7.7 K/uL 2.4    Lymphocytes     % 39  Lymphocyte #     0.7 - 4.0 K/uL 2.1  Monocytes Relative     % 14  Monocyte #     0.1 - 1.0 K/uL 0.7  Eosinophil     % 3  Eosinophils Absolute     0.0 - 0.5 K/uL 0.1  Basophil     % 0  Basophils Absolute     0.0 - 0.1 K/uL 0.0  Immature Granulocytes     % 0  Abs Immature Granulocytes     0.00 - 0.07 K/uL 0.01  Sodium     135 - 145 mmol/L 138  Potassium     3.5 - 5.1 mmol/L 4.6  Chloride     98 - 111 mmol/L 103  CO2     22 - 32 mmol/L 27  Glucose     70 - 99 mg/dL 368 (H)  BUN     6 - 20 mg/dL 18  Creatinine     0.44 - 1.00 mg/dL 0.96  Calcium     8.9 - 10.3 mg/dL 9.5  Total Protein     6.5 - 8.1 g/dL 7.8  Albumin  3.5 - 5.0 g/dL 3.5  AST     15 - 41 U/L 14 (L)  ALT     0 - 44 U/L 18  Alkaline Phosphatase     38 - 126 U/L 97  Total Bilirubin     0.3 - 1.2 mg/dL 0.3  GFR, Est Non African American     >60 mL/min >60  GFR, Est African American     >60 mL/min >60  Anion gap     5 - 15 8  Hepatitis B Surface Ag     NON REACTIVE NON REACTIVE  Hep B Core Total Ab     NON REACTIVE NON REACTIVE  HCV Ab     NON REACTIVE NON REACTIVE  LDH     98 - 192 U/L 188

## 2019-07-25 LAB — FISH,CLL PROGNOSTIC PANEL

## 2019-08-22 ENCOUNTER — Telehealth: Payer: Self-pay | Admitting: Family Medicine

## 2019-08-22 NOTE — Telephone Encounter (Signed)
Patient was advised to call 911 and go to ED

## 2019-08-22 NOTE — Telephone Encounter (Signed)
Patient called to make an appointment for discomfort in her chest and stated that it's nothing severe. It's been going on for a couple of days now. I transferred her to our nurse triage.

## 2019-08-23 NOTE — Telephone Encounter (Signed)
Noted. Message sent to PCP for review.

## 2019-08-23 NOTE — Telephone Encounter (Signed)
I do not see an ER visit. Can you please call patient to check on her. Thanks, BJ

## 2019-08-23 NOTE — Telephone Encounter (Signed)
Tried calling patient, unable to leave message, mailbox full. 

## 2019-08-26 NOTE — Telephone Encounter (Signed)
Left message for patient to return call to office. 

## 2019-08-28 NOTE — Telephone Encounter (Signed)
Spoke with patient and she stated that she feels much better after taking a laxative. Patient stated that she probably just needed to clean herself out.

## 2019-09-03 ENCOUNTER — Other Ambulatory Visit: Payer: Self-pay | Admitting: Family Medicine

## 2019-09-06 ENCOUNTER — Other Ambulatory Visit: Payer: Self-pay | Admitting: *Deleted

## 2019-09-06 DIAGNOSIS — C8300 Small cell B-cell lymphoma, unspecified site: Secondary | ICD-10-CM

## 2019-09-09 ENCOUNTER — Inpatient Hospital Stay: Payer: BC Managed Care – PPO | Attending: Hematology | Admitting: Hematology

## 2019-09-09 ENCOUNTER — Other Ambulatory Visit: Payer: Self-pay

## 2019-09-09 ENCOUNTER — Inpatient Hospital Stay: Payer: BC Managed Care – PPO

## 2019-09-09 VITALS — BP 180/87 | HR 68 | Temp 98.2°F | Resp 18 | Ht 64.0 in | Wt 193.1 lb

## 2019-09-09 DIAGNOSIS — C8300 Small cell B-cell lymphoma, unspecified site: Secondary | ICD-10-CM | POA: Diagnosis not present

## 2019-09-09 DIAGNOSIS — C8304 Small cell B-cell lymphoma, lymph nodes of axilla and upper limb: Secondary | ICD-10-CM | POA: Diagnosis present

## 2019-09-09 DIAGNOSIS — C9110 Chronic lymphocytic leukemia of B-cell type not having achieved remission: Secondary | ICD-10-CM | POA: Insufficient documentation

## 2019-09-09 LAB — CMP (CANCER CENTER ONLY)
ALT: 23 U/L (ref 0–44)
AST: 18 U/L (ref 15–41)
Albumin: 3.4 g/dL — ABNORMAL LOW (ref 3.5–5.0)
Alkaline Phosphatase: 105 U/L (ref 38–126)
Anion gap: 8 (ref 5–15)
BUN: 16 mg/dL (ref 6–20)
CO2: 26 mmol/L (ref 22–32)
Calcium: 9.5 mg/dL (ref 8.9–10.3)
Chloride: 104 mmol/L (ref 98–111)
Creatinine: 1.04 mg/dL — ABNORMAL HIGH (ref 0.44–1.00)
GFR, Est AFR Am: >60 mL/min (ref >60)
GFR, Estimated: 58 mL/min — ABNORMAL LOW (ref >60)
Glucose, Bld: 346 mg/dL — ABNORMAL HIGH (ref 70–99)
Potassium: 4.1 mmol/L (ref 3.5–5.1)
Sodium: 138 mmol/L (ref 135–145)
Total Bilirubin: 0.4 mg/dL (ref 0.3–1.2)
Total Protein: 7.8 g/dL (ref 6.5–8.1)

## 2019-09-09 LAB — CBC WITH DIFFERENTIAL (CANCER CENTER ONLY)
Abs Immature Granulocytes: 0.01 10*3/uL (ref 0.00–0.07)
Basophils Absolute: 0 10*3/uL (ref 0.0–0.1)
Basophils Relative: 1 %
Eosinophils Absolute: 0.2 10*3/uL (ref 0.0–0.5)
Eosinophils Relative: 3 %
HCT: 41.6 % (ref 36.0–46.0)
Hemoglobin: 13.5 g/dL (ref 12.0–15.0)
Immature Granulocytes: 0 %
Lymphocytes Relative: 43 %
Lymphs Abs: 2.4 10*3/uL (ref 0.7–4.0)
MCH: 25 pg — ABNORMAL LOW (ref 26.0–34.0)
MCHC: 32.5 g/dL (ref 30.0–36.0)
MCV: 76.9 fL — ABNORMAL LOW (ref 80.0–100.0)
Monocytes Absolute: 0.6 10*3/uL (ref 0.1–1.0)
Monocytes Relative: 11 %
Neutro Abs: 2.3 10*3/uL (ref 1.7–7.7)
Neutrophils Relative %: 42 %
Platelet Count: 230 10*3/uL (ref 150–400)
RBC: 5.41 MIL/uL — ABNORMAL HIGH (ref 3.87–5.11)
RDW: 14.6 % (ref 11.5–15.5)
WBC Count: 5.6 10*3/uL (ref 4.0–10.5)
nRBC: 0 % (ref 0.0–0.2)

## 2019-09-09 NOTE — Progress Notes (Signed)
HEMATOLOGY/ONCOLOGY CONSULTATION NOTE  Date of Service: 09/09/2019  Patient Care Team: [name], [name], MD as PCP - General (Family Medicine)  CHIEF COMPLAINTS/PURPOSE OF CONSULTATION:  Newly diagnosed Non-Hodgkin's Lymphoma  HISTORY OF PRESENTING ILLNESS:   [name] is a wonderful 59 y.o. 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 The pt reports that she is doing well overall.   The pt reports that her axillary lymphadenopathy was picked up during a routing Mammogram on 05/31/2019. Pt denies feeling any lumps or bumps in either axillary regions. Pt has felt the same for the last 6-12 months.   She has HTN and Diabetes. Her HTN is currently well-controlled but her Diabetes has been harder to control. Pt notes that "sometimes it's up and sometimes it's down". 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 She feels that the reaction on her left side is a little slower than her right side, but nothing that's noticeable. She denies any weakness, vision changes, or other residual symptoms from either stroke. 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 Pt made a full recovery and has no remaining symptoms. Her eczema is seasonal and not very bothersome. She denies any concerns for frequent infections. Pt has neuropathy in her hands/fingers and has been prescribed Gabapentin, which is helping.   Pt does not drink much alcohol and has never been a smoker. Her father passed from [place] and had a history of smoking. Her mother passed from a stroke last spring. 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 She is living alone and has one sister in the area.   Pt has had her first dose of the COVID19 vaccine and tolerated it well. She has the second dose scheduled.   Of note prior to the patient's visit today, pt has had  Flow Pathology ([id number]) completed on 06/26/2019 with results revealing "Monoclonal B-cell population identified."  Pt has had Right Axilla LN Bx [id number]) completed on 06/26/2019 with results revealing "NON-HODGKIN B-CELL LYMPHOMA"   Pt has also had Mammogram ([id number]) completed on 05/31/2019 with results revealing "Further evaluation is suggested for prominent lymph nodes in the bilateral axilla."  Most recent lab results (03/13/2019) of CBC and CMP is as follows: all values are WNL except for RBC at 5.61, MCV at 78.8, MCH at 25.8, Sodium at 131, Glucose at 502, BUN at 24.  On review of systems, pt reports hard stools, tingling/numbness in fingers/hands and denies weakness, vision changes, fevers, chills, night sweats, SOB, fatigue, constipation, diarrhea and any other symptoms.   On PMHx the pt reports Guillain-Barr syndrome, Type II Diabetes, Atopic Eczema, HLD, HTN, Stroke x2, Neuropathy. On Social Hx the pt reports that she does not drink much alcohol and is a non-smoker.  INTERVAL HISTORY:  [name] is a wonderful 59 y.o. female who is here for evaluation and management of Non-Hodgkin's Lymphoma. The patient's last visit with [place] was on 07/16/2019. The pt reports that she is doing well overall.  The pt reports that she has some increasing neuropathy In her fingers. She notes that her blood glucose levels have not been well-controlled. Pt is currently taking insulin, has been working with her PCP, and has spoken with a dietitian to help control her blood glucose levels.  Pt had some sweet tea yesterday that caused almost immediate diarrhea. She  continues to have diarrhea today.   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  On review of systems, pt reports neuropathy in fingers, diarrhea and denies fevers, chills, night sweats and any other symptoms.   MEDICAL HISTORY:  Past Medical History:  Diagnosis Date  . Abnormality of gait 05/10/2010  . BACK PAIN 11/14/2008  . CEREBROVASCULAR DISEASE 08/05/2008  . DIABETES MELLITUS, TYPE II 07/15/2008  . Diplopia 07/15/2008  . ECZEMA, ATOPIC 04/03/2009  . Guillain-Barre ([place])   . HYPERLIPIDEMIA 03/06/2009  . HYPERTENSION 07/15/2008  . Stroke ([place]) 2010   x2   . Vertebral artery stenosis     SURGICAL HISTORY: Past Surgical History:  Procedure Laterality Date  . ABDOMINAL HYSTERECTOMY    . DILATION AND CURETTAGE OF UTERUS    . FOOT SURGERY      SOCIAL HISTORY: Social History   Socioeconomic History  . Marital status: Single    Spouse name: Not on file  . Number of children: Not on file  . Years of education: Not on file  . Highest education level: Not on file  Occupational History  . Not on file  Tobacco Use  . Smoking status: Never Smoker  . Smokeless tobacco: Never Used  Substance and Sexual Activity  . Alcohol use: No  . Drug use: No  . Sexual activity: Not on file  Other Topics Concern  . Not on file  Social History Narrative  . Not on file   Social Determinants of Health   Financial Resource Strain:   . Difficulty of Paying Living Expenses:   Food Insecurity:   . Worried About Charity fundraiser in the Last Year:   . Arboriculturist in the Last Year:   Transportation Needs:   . Film/video editor (Medical):   [name] Lack of Transportation (Non-Medical):   Physical Activity:   . Days of Exercise per Week:   . Minutes of Exercise per Session:   Stress:   . Feeling of Stress :   Social Connections:   . Frequency of Communication with Friends and Family:   . Frequency of Social Gatherings with Friends and Family:   . Attends Religious Services:   . Active Member of Clubs or  Organizations:   . Attends Archivist Meetings:   [name] Marital Status:   Intimate Partner Violence:   . Fear of Current or Ex-Partner:   . Emotionally Abused:   [name] Physically Abused:   . Sexually Abused:     FAMILY HISTORY: Family History  Problem Relation Age of Onset  . Diabetes Sister   . Asthma Other   . Stroke Other   . Hypertension Other   . Diabetes Mother   . Stroke Mother   . Cancer Father   . Breast cancer Neg Hx     ALLERGIES:  has No Known Allergies.  MEDICATIONS:  Current Outpatient Medications  Medication Sig Dispense Refill  . amLODipine (NORVASC) 10 MG tablet Take 1 tablet (10 mg total) by mouth daily. 90 tablet 3  . Blood Pressure Monitoring (BLOOD PRESSURE MONITOR AUTOMAT) DEVI 1 Device by Does not apply route daily. 1 Device 0  .  clopidogrel (PLAVIX) 75 MG tablet Take 1 tablet (75 mg total) by mouth daily. 90 tablet 3  . FLUoxetine (PROZAC) 20 MG capsule Take 1 capsule (20 mg total) by mouth daily. 90 capsule 2  . gabapentin (NEURONTIN) 300 MG capsule TAKE 1 CAPSULE BY MOUTH THREE TIMES DAILY 90 capsule 4  . glucose blood (CONTOUR NEXT TEST) test strip 1 each by Other route 2 (two) times daily. And lancets 2/day 200 each 3  . glucose blood (ONETOUCH VERIO) test strip USE TO CHECK BLOOD SUGAR TWICE A DAY AND PRN 100 each 6  . Insulin Glargine (LANTUS) 100 UNIT/ML Solostar Pen Inject 120 Units into the skin every morning. And pen needles 1/day 15 pen 11  . insulin lispro (HUMALOG) 100 UNIT/ML injection 15 U 5-10 min before meals or with meals.Extra Humalog insulin if sugars is over 150 as follow:Add 1 unit of Humalog if your sugar is 150-200. Add 3 units of HumaLog if your sugar is 201-250.Add 5 units of Humalog if your sugar is 251-300 Add 7 units of Humalog if your sugar is over 301 10 mL 1  . lisinopril-hydrochlorothiazide (ZESTORETIC) 20-25 MG tablet Take 1 tablet by mouth daily. 90 tablet 2  . omeprazole (PRILOSEC) 20 MG capsule Take 1 capsule by  mouth once daily 90 capsule 0  . omeprazole (PRILOSEC) 20 MG capsule Take 1 capsule (20 mg total) by mouth daily. 30 capsule 3  . ONE TOUCH LANCETS MISC USE TO CHECK BLOOD SUGAR TWICE A DAY AND PRN 100 each 6  . pravastatin (PRAVACHOL) 40 MG tablet Take 1 tablet by mouth once daily 90 tablet 1  . triamcinolone ointment (KENALOG) 0.5 % Apply topically 2 (two) times daily as needed. 15 g 0   No current facility-administered medications for this visit.    REVIEW OF SYSTEMS:   A 10+ POINT REVIEW OF SYSTEMS WAS OBTAINED including neurology, dermatology, psychiatry, cardiac, respiratory, lymph, extremities, GI, GU, Musculoskeletal, constitutional, breasts, reproductive, HEENT.  All pertinent positives are noted in the HPI.  All others are negative.   PHYSICAL EXAMINATION: ECOG PERFORMANCE STATUS: 0 - Asymptomatic  . Vitals:   09/09/19 0845  BP: (!) 180/87  Pulse: 68  Resp: 18  Temp: 98.2 F (36.8 C)  SpO2: 99%   Filed Weights   09/09/19 0845  Weight: 193 lb 1.6 oz (87.6 kg)   .Body mass index is 33.15 kg/m.  GENERAL:alert, in no acute distress and comfortable SKIN: no acute rashes, no significant lesions EYES: conjunctiva are pink and non-injected, sclera anicteric OROPHARYNX: MMM, no exudates, no oropharyngeal erythema or ulceration NECK: supple, no JVD LYMPH:  no palpable lymphadenopathy in the cervical, axillary or inguinal regions  LUNGS: clear to auscultation b/l with normal respiratory effort HEART: regular rate & rhythm ABDOMEN:  normoactive bowel sounds , non tender, not distended. No palpable hepatosplenomegaly.  Extremity: no pedal edema PSYCH: alert & oriented x 3 with fluent speech NEURO: no focal motor/sensory deficits  LABORATORY DATA:  I have reviewed the data as listed  . CBC Latest Ref Rng & Units 09/09/2019 07/16/2019 03/13/2019  WBC 4.0 - 10.5 K/uL 5.6 5.5 6.3  Hemoglobin 12.0 - 15.0 g/dL 13.5 13.3 14.5  Hematocrit 36.0 - 46.0 % 41.6 41.0 44.2    Platelets 150 - 400 K/uL 230 236 245    . CMP Latest Ref Rng & Units 09/09/2019 07/16/2019 03/20/2019  Glucose 70 - 99 mg/dL 346(H) 368(H) 251(H)  BUN 6 - 20 mg/dL 16 18 24(H)  Creatinine  0.44 - 1.00 mg/dL 1.04(H) 0.96 0.95  Sodium 135 - 145 mmol/L 138 138 133(L)  Potassium 3.5 - 5.1 mmol/L 4.1 4.6 4.0  Chloride 98 - 111 mmol/L 104 103 97  CO2 22 - 32 mmol/L 26 27 28   Calcium 8.9 - 10.3 mg/dL 9.5 9.5 9.7  Total Protein 6.5 - 8.1 g/dL 7.8 7.8 7.2  Total Bilirubin 0.3 - 1.2 mg/dL 0.4 0.3 0.4  Alkaline Phos 38 - 126 U/L 105 97 87  AST 15 - 41 U/L 18 14(L) 14  ALT 0 - 44 U/L 23 18 16    07/16/2019 FISH-CLL Prognostic Panel:         RADIOGRAPHIC STUDIES: I have personally reviewed the radiological images as listed and agreed with the findings in the report. No results found.  ASSESSMENT & PLAN:    59 yo with   1) Recently Diagnosed CLL/SLL  Diagnosed incidentally as LNadenopathy noted on routine screening mamogram. No consitutional symptoms.  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  -Recommended that the pt drink at least 48-64 oz of water each day and walk 20-30 minutes each day.   -Recommend live cultured yogurt if diarrhea continues. Okay to take Pepto-bismol. -Recommend pt continue to f/u with PCP for DM II management  -Pt given contact information to schedule CT scan -Will get CT C/A/P in 12-14 weeks  -Will see back in 4 months with labs    FOLLOW UP: Patient to call to schedule CT neck/chest/abd/pelvis within next 12-14 weeks RTC with Dr Irene Limbo with labs in 4 months   The total time spent in the appt was 20 minutes and more than 50% was on counseling and direct patient cares.  All of the patient's questions were answered with apparent satisfaction. The patient knows to call the clinic with any problems, questions or concerns.   Sullivan Lone MD [place] AAHIVMS [facility] Hematology/Oncology Physician [facility]  (Office):       [phone] (Work cell):  [phone] (Fax):           [phone]  09/09/2019 9:48 AM  I, Yevette Edwards, am acting as a scribe for Dr. Sullivan Lone.   .I have reviewed the above documentation for accuracy and completeness, and I agree with the above. Brunetta Genera MD

## 2019-09-12 ENCOUNTER — Telehealth: Payer: Self-pay | Admitting: Hematology

## 2019-09-12 NOTE — Telephone Encounter (Signed)
Scheduled per 05/10 los, patient has been called and voicemail was left.

## 2019-10-08 ENCOUNTER — Telehealth (INDEPENDENT_AMBULATORY_CARE_PROVIDER_SITE_OTHER): Payer: BC Managed Care – PPO | Admitting: Family Medicine

## 2019-10-08 ENCOUNTER — Encounter: Payer: Self-pay | Admitting: Family Medicine

## 2019-10-08 VITALS — Ht 64.0 in

## 2019-10-08 DIAGNOSIS — I10 Essential (primary) hypertension: Secondary | ICD-10-CM | POA: Diagnosis not present

## 2019-10-08 DIAGNOSIS — E1142 Type 2 diabetes mellitus with diabetic polyneuropathy: Secondary | ICD-10-CM

## 2019-10-08 DIAGNOSIS — E1169 Type 2 diabetes mellitus with other specified complication: Secondary | ICD-10-CM | POA: Diagnosis not present

## 2019-10-08 DIAGNOSIS — E1151 Type 2 diabetes mellitus with diabetic peripheral angiopathy without gangrene: Secondary | ICD-10-CM

## 2019-10-08 DIAGNOSIS — C8300 Small cell B-cell lymphoma, unspecified site: Secondary | ICD-10-CM | POA: Insufficient documentation

## 2019-10-08 DIAGNOSIS — E785 Hyperlipidemia, unspecified: Secondary | ICD-10-CM

## 2019-10-08 MED ORDER — LISINOPRIL-HYDROCHLOROTHIAZIDE 20-12.5 MG PO TABS: 2.0000 | ORAL_TABLET | Freq: Every day | ORAL | 1 refills | Status: DC

## 2019-10-08 MED ORDER — TRIAMCINOLONE ACETONIDE 0.5 % EX OINT: TOPICAL_OINTMENT | Freq: Two times a day (BID) | CUTANEOUS | 1 refills | Status: DC | PRN

## 2019-10-08 NOTE — Assessment & Plan Note (Signed)
Problem has been poorly controlled. No changes in current management, treatment will be adjusted according to A1c. Educated about the importance of following dietary recommendations. Eye exam is current. Appropriate foot care recommended.

## 2019-10-08 NOTE — Assessment & Plan Note (Signed)
BP seems to be poorly controlled. I will go ahead and increase the dose of lisinopril from 20 mg to 25 mg. No changes in amlodipine or HCTZ. Continue low-salt diet. It would be ideal to check BP at home. We discussed possible complications of elevated BP.

## 2019-10-08 NOTE — Assessment & Plan Note (Signed)
Continue pravastatin 20 mg daily. Further recommendation will be given according to lab results. Having fasting labs at [facility].

## 2019-10-08 NOTE — Progress Notes (Addendum)
Virtual Visit via Telephone Note  I connected with [name] on 10/08/19 at  9:30 AM EDT by telephone and verified that I am speaking with the correct person using two identifiers.  We used video but camera did not work.  I discussed the limitations, risks, security and privacy concerns of performing an evaluation and management service by telephone and the availability of in person appointments. I also discussed with the patient that there may be a patient responsible charge related to this service. The patient expressed understanding and agreed to proceed.  Location patient: home Location provider: work office Participants present for the call: patient, provider Patient did not have a visit in the prior 7 days to address this/these issue(s).   History of Present Illness: Ms Lesage is a 59 yo female following on chronic health problems today. Last follow-up visit 07/01/2019, telemedicine.  Since her last visit she has followed with oncologist for a small lymphocytic lymphoma.  According to patient, for now she is not going to need treatment but she needs to have a "scan" done, already scheduled.  Hypertension: She denies checking BP at home. States that BP has been "good" during office visit. Reviewing visits to oncologist, BP has been elevated: 180s/90s.  Currently she is on amlodipine 10 mg daily and lisinopril-HCTZ 20-25 mg daily. She is tolerating medication well. She has not noted severe/frequent headache, visual changes, chest pain, dyspnea, palpitation, focal weakness, or edema.  Lab Results  Component Value Date   CREATININE 1.04 (H) 09/09/2019   BUN 16 09/09/2019   NA 138 09/09/2019   K 4.1 09/09/2019   CL 104 09/09/2019   CO2 26 09/09/2019   Lab Results  Component Value Date   ALT 23 09/09/2019   AST 18 09/09/2019   ALKPHOS 105 09/09/2019   BILITOT 0.4 09/09/2019   Hyperlipidemia: Currently she is on pravastatin 20 mg daily. Tolerated medication well.  Lab  Results  Component Value Date   CHOL 213 (H) 07/20/2018   HDL 39.30 07/20/2018   LDLCALC 146 (H) 07/20/2018   LDLDIRECT 175.9 03/02/2012   TRIG 139.0 07/20/2018   CHOLHDL 5 07/20/2018   Lower extremity numbness and tingling stable. She is on gabapentin 300 mg 3 times daily. Negative for abdominal pain, nausea,vomiting, polydipsia,polyuria, or polyphagia.  DM2: Average glucose 100s- 200s. Currently she is on Lantus 120 units daily and Humalog.She was instructed to give 5 to 10 units before meals and adjusting dose according to fingerstick numbers. Not sure about compliance with meds. Lantus was last filled by Dr Loanne Drilling, endocrinologist, 03/09/18.  Average Humalog dose 3 to 5 units daily, she is using sliding scale.  She did not arrange appointment with endocrinologist as instructed.  Lab Results  Component Value Date   HGBA1C 13.2 (H) 03/20/2019   Lab Results  Component Value Date   MICROALBUR 2.9 (H) 03/20/2019   MICROALBUR 3.8 (H) 02/26/2018     Observations/Objective: Patient sounds cheerful and well on the phone. I do not appreciate any SOB. Speech and thought processing are grossly intact. Patient reported vitals:Ht 5\' 4"  (1.626 m)   BMI 33.15 kg/m   Assessment and Plan:  Orders Placed This Encounter  Procedures  . Hemoglobin A1c  . Fructosamine  . Lipid panel  . Microalbumin / creatinine urine ratio   Lab Results  Component Value Date   CHOL 240 (H) 10/09/2019   HDL 37.50 (L) 10/09/2019   LDLCALC 172 (H) 10/09/2019   LDLDIRECT 175.9 03/02/2012   TRIG 152.0 (  H) 10/09/2019   CHOLHDL 6 10/09/2019   Lab Results  Component Value Date   HGBA1C 13.6 (H) 10/09/2019   Lab Results  Component Value Date   MICROALBUR 5.3 (H) 10/09/2019   MICROALBUR 2.9 (H) 03/20/2019   Diabetic peripheral neuropathy associated with type 2 diabetes mellitus ([place]) Problem has been poorly controlled. No changes in current management, treatment will be adjusted according to  A1c. Educated about the importance of following dietary recommendations. Eye exam is current. Appropriate foot care recommended.  Essential hypertension BP seems to be poorly controlled. I will go ahead and increase the dose of lisinopril from 20 mg to 25 mg. No changes in amlodipine or HCTZ. Continue low-salt diet. It would be ideal to check BP at home. We discussed possible complications of elevated BP.  Hyperlipidemia associated with type 2 diabetes mellitus ([facility]) Continue pravastatin 20 mg daily. Further recommendation will be given according to lab results. Having fasting labs at [facility].  Small lymphocytic lymphoma [facility]) Following with oncologist.  Follow Up Instructions:  Return in about 3 months (around 01/10/2020) for HTN,DMII  in the office..  I did not refer this patient for an OV in the next 24 hours for this/these issue(s).  I discussed the assessment and treatment plan with the patient. Ms Lizer was provided an opportunity to ask questions and all were answered. She agreed with the plan and demonstrated an understanding of the instructions.   I provided 23 minutes of non-face-to-face time during this encounter.   Zain Lankford [name], MD

## 2019-10-09 ENCOUNTER — Other Ambulatory Visit: Payer: Self-pay

## 2019-10-09 ENCOUNTER — Other Ambulatory Visit (INDEPENDENT_AMBULATORY_CARE_PROVIDER_SITE_OTHER): Payer: BC Managed Care – PPO

## 2019-10-09 ENCOUNTER — Other Ambulatory Visit: Payer: BC Managed Care – PPO

## 2019-10-09 DIAGNOSIS — E1169 Type 2 diabetes mellitus with other specified complication: Secondary | ICD-10-CM | POA: Diagnosis not present

## 2019-10-09 DIAGNOSIS — E1142 Type 2 diabetes mellitus with diabetic polyneuropathy: Secondary | ICD-10-CM | POA: Diagnosis not present

## 2019-10-09 DIAGNOSIS — E785 Hyperlipidemia, unspecified: Secondary | ICD-10-CM

## 2019-10-09 LAB — LIPID PANEL
Cholesterol: 240 mg/dL — ABNORMAL HIGH (ref 0–200)
HDL: 37.5 mg/dL — ABNORMAL LOW (ref >39.00)
LDL Cholesterol: 172 mg/dL — ABNORMAL HIGH (ref 0–99)
NonHDL: 202.52
Total CHOL/HDL Ratio: 6
Triglycerides: 152 mg/dL — ABNORMAL HIGH (ref 0.0–149.0)
VLDL: 30.4 mg/dL (ref 0.0–40.0)

## 2019-10-09 LAB — MICROALBUMIN / CREATININE URINE RATIO
Creatinine,U: 71.4 mg/dL
Microalb Creat Ratio: 7.4 mg/g (ref 0.0–30.0)
Microalb, Ur: 5.3 mg/dL — ABNORMAL HIGH (ref 0.0–1.9)

## 2019-10-10 ENCOUNTER — Telehealth: Payer: Self-pay | Admitting: Hematology

## 2019-10-10 LAB — HEMOGLOBIN A1C
Hgb A1c MFr Bld: 13.6 % of total Hgb — ABNORMAL HIGH (ref <5.7)
Mean Plasma Glucose: 344 (calc)
eAG (mmol/L): 19 (calc)

## 2019-10-10 MED ORDER — INSULIN LISPRO 100 UNIT/ML ~~LOC~~ SOLN: SUBCUTANEOUS | 3 refills | Status: DC

## 2019-10-10 MED ORDER — AMLODIPINE BESYLATE 10 MG PO TABS: 10.0000 mg | ORAL_TABLET | Freq: Every day | ORAL | 3 refills | Status: DC

## 2019-10-10 NOTE — Telephone Encounter (Signed)
Scheduled appt per 6/10 sch message - unable to reach pt . Left message with appt date and time

## 2019-10-10 NOTE — Addendum Note (Signed)
Addended by: [name], Nansi [place] G on: 10/10/2019 01:14 PM   Modules accepted: Orders

## 2019-10-11 ENCOUNTER — Other Ambulatory Visit: Payer: Self-pay | Admitting: *Deleted

## 2019-10-11 MED ORDER — ATORVASTATIN CALCIUM 40 MG PO TABS
40.0000 mg | ORAL_TABLET | Freq: Every day | ORAL | 3 refills | Status: DC
Start: 2019-10-11 — End: 2020-12-28

## 2019-10-14 ENCOUNTER — Other Ambulatory Visit: Payer: Self-pay | Admitting: *Deleted

## 2019-10-14 ENCOUNTER — Other Ambulatory Visit: Payer: Self-pay | Admitting: Hematology

## 2019-10-14 DIAGNOSIS — C8300 Small cell B-cell lymphoma, unspecified site: Secondary | ICD-10-CM

## 2019-10-14 DIAGNOSIS — R718 Other abnormality of red blood cells: Secondary | ICD-10-CM

## 2019-10-14 LAB — FRUCTOSAMINE: Fructosamine: 469 umol/L — ABNORMAL HIGH (ref 205–285)

## 2019-10-15 ENCOUNTER — Ambulatory Visit (HOSPITAL_COMMUNITY)
Admission: RE | Admit: 2019-10-15 | Discharge: 2019-10-15 | Disposition: A | Discharge status: Home / Self Care | Payer: BC Managed Care – PPO | Source: Ambulatory Visit | Attending: Hematology | Admitting: Hematology

## 2019-10-15 ENCOUNTER — Inpatient Hospital Stay: Payer: BC Managed Care – PPO | Attending: Hematology

## 2019-10-15 ENCOUNTER — Other Ambulatory Visit: Payer: Self-pay

## 2019-10-15 DIAGNOSIS — R718 Other abnormality of red blood cells: Secondary | ICD-10-CM

## 2019-10-15 DIAGNOSIS — C8304 Small cell B-cell lymphoma, lymph nodes of axilla and upper limb: Secondary | ICD-10-CM | POA: Diagnosis present

## 2019-10-15 DIAGNOSIS — C8300 Small cell B-cell lymphoma, unspecified site: Secondary | ICD-10-CM | POA: Diagnosis present

## 2019-10-15 DIAGNOSIS — C9110 Chronic lymphocytic leukemia of B-cell type not having achieved remission: Secondary | ICD-10-CM | POA: Diagnosis not present

## 2019-10-15 LAB — CBC WITH DIFFERENTIAL (CANCER CENTER ONLY)
Abs Immature Granulocytes: 0.01 10*3/uL (ref 0.00–0.07)
Basophils Absolute: 0 10*3/uL (ref 0.0–0.1)
Basophils Relative: 1 %
Eosinophils Absolute: 0.1 10*3/uL (ref 0.0–0.5)
Eosinophils Relative: 2 %
HCT: 41.9 % (ref 36.0–46.0)
Hemoglobin: 13.8 g/dL (ref 12.0–15.0)
Immature Granulocytes: 0 %
Lymphocytes Relative: 47 %
Lymphs Abs: 2.6 10*3/uL (ref 0.7–4.0)
MCH: 25.7 pg — ABNORMAL LOW (ref 26.0–34.0)
MCHC: 32.9 g/dL (ref 30.0–36.0)
MCV: 77.9 fL — ABNORMAL LOW (ref 80.0–100.0)
Monocytes Absolute: 0.7 10*3/uL (ref 0.1–1.0)
Monocytes Relative: 13 %
Neutro Abs: 2 10*3/uL (ref 1.7–7.7)
Neutrophils Relative %: 37 %
Platelet Count: 232 10*3/uL (ref 150–400)
RBC: 5.38 MIL/uL — ABNORMAL HIGH (ref 3.87–5.11)
RDW: 14.3 % (ref 11.5–15.5)
WBC Count: 5.6 10*3/uL (ref 4.0–10.5)
nRBC: 0 % (ref 0.0–0.2)

## 2019-10-15 LAB — CMP (CANCER CENTER ONLY)
ALT: 21 U/L (ref 0–44)
AST: 15 U/L (ref 15–41)
Albumin: 3.6 g/dL (ref 3.5–5.0)
Alkaline Phosphatase: 100 U/L (ref 38–126)
Anion gap: 11 (ref 5–15)
BUN: 14 mg/dL (ref 6–20)
CO2: 29 mmol/L (ref 22–32)
Calcium: 10.5 mg/dL — ABNORMAL HIGH (ref 8.9–10.3)
Chloride: 100 mmol/L (ref 98–111)
Creatinine: 1.03 mg/dL — ABNORMAL HIGH (ref 0.44–1.00)
GFR, Est AFR Am: >60 mL/min (ref >60)
GFR, Estimated: 59 mL/min — ABNORMAL LOW (ref >60)
Glucose, Bld: 404 mg/dL — ABNORMAL HIGH (ref 70–99)
Potassium: 5 mmol/L (ref 3.5–5.1)
Sodium: 140 mmol/L (ref 135–145)
Total Bilirubin: 0.4 mg/dL (ref 0.3–1.2)
Total Protein: 8.2 g/dL — ABNORMAL HIGH (ref 6.5–8.1)

## 2019-10-15 MED ORDER — SODIUM CHLORIDE (PF) 0.9 % IJ SOLN
INTRAMUSCULAR | Status: AC
  Filled 2019-10-15: qty 50

## 2019-10-15 MED ORDER — IOHEXOL 300 MG/ML  SOLN
100.0000 mL | Freq: Once | INTRAMUSCULAR | Status: AC | PRN
  Administered 2019-10-15: 100 mL via INTRAVENOUS

## 2019-10-16 ENCOUNTER — Other Ambulatory Visit: Payer: Self-pay | Admitting: Family Medicine

## 2019-10-16 DIAGNOSIS — E1151 Type 2 diabetes mellitus with diabetic peripheral angiopathy without gangrene: Secondary | ICD-10-CM

## 2019-10-18 ENCOUNTER — Telehealth: Payer: Self-pay | Admitting: Family Medicine

## 2019-10-18 NOTE — Telephone Encounter (Signed)
Spoke with patient and informed her that I do not see anything noted where someone from this office called her today about medication. Patient verbalized understanding.

## 2019-10-18 NOTE — Telephone Encounter (Signed)
Pt stated she just spoke to [place] and already forgot what medication she was told?  Pt can be reached at [phone]

## 2019-10-21 ENCOUNTER — Telehealth: Payer: Self-pay | Admitting: Family Medicine

## 2019-10-21 NOTE — Telephone Encounter (Signed)
Spoke with patient and she stated that she has a rash on her arms that she believe came from the contrast that she took to have imaging done. Patient would like to know if there is anything PCP would advise her to do without having an appointment. Patient stated that she cannot take Metformin-emplaglifozin 08-998 mg because Metformin bothers her stomach, what else can she take. Please advise.

## 2019-10-21 NOTE — Telephone Encounter (Signed)
Pt has had bumps all over body for a few days after having a ct scan. Pt is not sure what she needs to do? Pt refused an appt and wanted to speak to you first. Thanks

## 2019-10-21 NOTE — Telephone Encounter (Signed)
Returned call to patient and went over lab results and recommendations again with patient. Patient verbalized understanding and stated that she wanted to make sure she understood what medication she was starting and stopping.

## 2019-10-23 ENCOUNTER — Other Ambulatory Visit: Payer: Self-pay | Admitting: Family Medicine

## 2019-10-23 MED ORDER — TRIAMCINOLONE ACETONIDE 0.5 % EX OINT: TOPICAL_OINTMENT | Freq: Two times a day (BID) | CUTANEOUS | 1 refills | Status: DC | PRN

## 2019-10-23 NOTE — Telephone Encounter (Signed)
If she is not have severe symptoms, I prefer no to start oral steroid due to poorly controlled glucose. She can use topical Triamcinolone bid x 14 days. I sent a new Rx. In regard to diabetes management, she can increase dose of Humalog from 15 U before meals to 20 U. No changes in rest of meds. I already placed referral to endocrinologist.  Thanks, BJ

## 2019-10-25 NOTE — Telephone Encounter (Signed)
Patient notified and verbalized understanding. 

## 2019-11-15 ENCOUNTER — Other Ambulatory Visit: Payer: Self-pay

## 2019-11-15 ENCOUNTER — Telehealth: Payer: Self-pay | Admitting: Family Medicine

## 2019-11-15 ENCOUNTER — Encounter: Payer: Self-pay | Admitting: Family Medicine

## 2019-11-15 ENCOUNTER — Ambulatory Visit: Payer: BC Managed Care – PPO | Admitting: Family Medicine

## 2019-11-15 ENCOUNTER — Ambulatory Visit: Payer: BC Managed Care – PPO | Admitting: Endocrinology

## 2019-11-15 ENCOUNTER — Encounter: Payer: Self-pay | Admitting: Endocrinology

## 2019-11-15 VITALS — BP 140/70 | HR 67 | Ht 64.0 in | Wt 188.4 lb

## 2019-11-15 VITALS — BP 120/66 | HR 66 | Temp 97.6°F | Wt 188.4 lb

## 2019-11-15 DIAGNOSIS — R42 Dizziness and giddiness: Secondary | ICD-10-CM

## 2019-11-15 DIAGNOSIS — E1151 Type 2 diabetes mellitus with diabetic peripheral angiopathy without gangrene: Secondary | ICD-10-CM

## 2019-11-15 MED ORDER — INSULIN GLARGINE 100 UNIT/ML SOLOSTAR PEN: 120.0000 [IU] | PEN_INJECTOR | SUBCUTANEOUS | 3 refills | Status: DC

## 2019-11-15 NOTE — Patient Instructions (Addendum)
The next 2 steps in your diabetes care are to get the insulin consistently, and to bring cbg record here.  Because you have health insurance, I am sending a prescription for the Lantus.  Here is a discount card.  check your blood sugar  a day.  vary the time of day when you check, between before the 3 meals, and at bedtime.  also check if you have symptoms of your blood sugar being too high or too low.  please keep a record of the readings and bring it to your next appointment here (or you can bring the meter itself).  You can write it on any piece of paper.  please call us sooner if your blood sugar goes below 70, or if you have a lot of readings over 200.  Please come back for a follow-up appointment in 6 weeks.

## 2019-11-15 NOTE — Patient Instructions (Signed)
Vertigo Vertigo is the feeling that you or your surroundings are moving when they are not. This feeling can come and go at any time. Vertigo often goes away on its own. Vertigo can be dangerous if it occurs while you are doing something that could endanger you or others, such as driving or operating machinery. Your health care provider will do tests to determine the cause of your vertigo. Tests will also help your health care provider decide how best to treat your condition. Follow these instructions at home: Eating and drinking      Drink enough fluid to keep your urine pale yellow.  Do not drink alcohol. Activity  Return to your normal activities as told by your health care provider. Ask your health care provider what activities are safe for you.  In the morning, first sit up on the side of the bed. When you feel okay, stand slowly while you hold onto something until you know that your balance is fine.  Move slowly. Avoid sudden body or head movements or certain positions, as told by your health care provider.  If you have trouble walking or keeping your balance, try using a cane for stability. If you feel dizzy or unstable, sit down right away.  Avoid doing any tasks that would cause danger to you or others if vertigo occurs.  Avoid bending down if you feel dizzy. Place items in your home so that they are easy for you to reach without leaning over.  Do not drive or use heavy machinery if you feel dizzy. General instructions  Take over-the-counter and prescription medicines only as told by your health care provider.  Keep all follow-up visits as told by your health care provider. This is important. Contact a health care provider if:  Your medicines do not relieve your vertigo or they make it worse.  You have a fever.  Your condition gets worse or you develop new symptoms.  Your family or friends notice any behavioral changes.  Your nausea or vomiting gets worse.  You  have numbness or a prickling and tingling sensation in part of your body. Get help right away if you:  Have difficulty moving or speaking.  Are always dizzy.  Faint.  Develop severe headaches.  Have weakness in your hands, arms, or legs.  Have changes in your hearing or vision.  Develop a stiff neck.  Develop sensitivity to light. Summary  Vertigo is the feeling that you or your surroundings are moving when they are not.  Your health care provider will do tests to determine the cause of your vertigo.  Follow instructions for home care. You may be told to avoid certain tasks, positions, or movements.  Contact a health care provider if your medicines do not relieve your symptoms, or if you have a fever, nausea, vomiting, or changes in behavior.  Get help right away if you have severe headaches or difficulty speaking, or you develop hearing or vision problems. This information is not intended to replace advice given to you by your health care provider. Make sure you discuss any questions you have with your health care provider. Document Revised: 03/12/2018 Document Reviewed: 03/12/2018 Elsevier Patient Education  2020 [place].  Follow up immediately for any slurred speech, focal weakness, loss of coordination, or visual changes.

## 2019-11-15 NOTE — Telephone Encounter (Signed)
After speaking to pt and going over her symptoms she decided to schedule an appt.

## 2019-11-15 NOTE — Progress Notes (Signed)
Subjective:    Patient ID: [name], female    DOB: [birth year], 59 y.o.   MRN: [id number]  HPI Pt returns for f/u of diabetes mellitus:  DM type: Insulin-requiring type 2.   Dx'ed: 2010.   Complications: cerebrovascular disease and PN Therapy: insulin since soon after dx.  GDM: never.  DKA: never.  Severe hypoglycemia: never.  Pancreatitis: never SDOH: pt gets insulin from [facility], but she says she gets insulin inconsistently this way.   Other: She requests to take just 1 injection per day, as she is having difficulty remembering the multiple daily injections.   Interval history: She seldom checks cbg.  Pt had episode of dizziness yesterday.  She called 911, who checked cbg (350).  Pt says she has not missed the Lantus since last ov.   Past Medical History:  Diagnosis Date  . Abnormality of gait 05/10/2010  . BACK PAIN 11/14/2008  . CEREBROVASCULAR DISEASE 08/05/2008  . DIABETES MELLITUS, TYPE II 07/15/2008  . Diplopia 07/15/2008  . ECZEMA, ATOPIC 04/03/2009  . Guillain-Barre ([place])   . HYPERLIPIDEMIA 03/06/2009  . HYPERTENSION 07/15/2008  . Stroke ([place]) 2010   x2   . Vertebral artery stenosis     Past Surgical History:  Procedure Laterality Date  . ABDOMINAL HYSTERECTOMY    . DILATION AND CURETTAGE OF UTERUS    . FOOT SURGERY      Social History   Socioeconomic History  . Marital status: Single    Spouse name: Not on file  . Number of children: Not on file  . Years of education: Not on file  . Highest education level: Not on file  Occupational History  . Not on file  Tobacco Use  . Smoking status: Never Smoker  . Smokeless tobacco: Never Used  Vaping Use  . Vaping Use: Never used  Substance and Sexual Activity  . Alcohol use: No  . Drug use: No  . Sexual activity: Not on file  Other Topics Concern  . Not on file  Social History Narrative  . Not on file   Social Determinants of Health   Financial Resource Strain:   . Difficulty of Paying Living Expenses:    Food Insecurity:   . Worried About Charity fundraiser in the Last Year:   . Arboriculturist in the Last Year:   Transportation Needs:   . Film/video editor (Medical):   [name] Lack of Transportation (Non-Medical):   Physical Activity:   . Days of Exercise per Week:   . Minutes of Exercise per Session:   Stress:   . Feeling of Stress :   Social Connections:   . Frequency of Communication with Friends and Family:   . Frequency of Social Gatherings with Friends and Family:   . Attends Religious Services:   . Active Member of Clubs or Organizations:   . Attends Archivist Meetings:   [name] Marital Status:   Intimate Partner Violence:   . Fear of Current or Ex-Partner:   . Emotionally Abused:   [name] Physically Abused:   . Sexually Abused:     Current Outpatient Medications on File Prior to Visit  Medication Sig Dispense Refill  . amLODipine (NORVASC) 10 MG tablet Take 1 tablet (10 mg total) by mouth daily. 90 tablet 3  . atorvastatin (LIPITOR) 40 MG tablet Take 1 tablet (40 mg total) by mouth daily. 90 tablet 3  . Blood Pressure Monitoring (BLOOD PRESSURE MONITOR AUTOMAT) DEVI 1 Device by  Does not apply route daily. 1 Device 0  . clopidogrel (PLAVIX) 75 MG tablet Take 1 tablet (75 mg total) by mouth daily. 90 tablet 3  . FLUoxetine (PROZAC) 20 MG capsule Take 1 capsule (20 mg total) by mouth daily. 90 capsule 2  . gabapentin (NEURONTIN) 300 MG capsule TAKE 1 CAPSULE BY MOUTH THREE TIMES DAILY 90 capsule 4  . glucose blood (CONTOUR NEXT TEST) test strip 1 each by Other route 2 (two) times daily. And lancets 2/day 200 each 3  . glucose blood (ONETOUCH VERIO) test strip USE TO CHECK BLOOD SUGAR TWICE A DAY AND PRN 100 each 6  . insulin lispro (HUMALOG) 100 UNIT/ML injection 15 U 5-10 min before meals or with meals.Extra Humalog insulin if sugars is over 150 as follow:Add 1 unit of Humalog if your sugar is 150-200. Add 3 units of HumaLog if your sugar is 201-250.Add 5 units of Humalog  if your sugar is 251-300 Add 7 units of Humalog if your sugar is over 301 10 mL 3  . lisinopril-hydrochlorothiazide (ZESTORETIC) 20-12.5 MG tablet Take 2 tablets by mouth daily. 180 tablet 1  . omeprazole (PRILOSEC) 20 MG capsule Take 1 capsule by mouth once daily 90 capsule 0  . omeprazole (PRILOSEC) 20 MG capsule Take 1 capsule (20 mg total) by mouth daily. 30 capsule 3  . ONE TOUCH LANCETS MISC USE TO CHECK BLOOD SUGAR TWICE A DAY AND PRN 100 each 6  . triamcinolone ointment (KENALOG) 0.5 % Apply topically 2 (two) times daily as needed. 45 g 1   No current facility-administered medications on file prior to visit.    No Known Allergies  Family History  Problem Relation Age of Onset  . Diabetes Sister   . Asthma Other   . Stroke Other   . Hypertension Other   . Diabetes Mother   . Stroke Mother   . Cancer Father   . Breast cancer Neg Hx     BP 140/70 (BP Location: Left Arm, Patient Position: Sitting, Cuff Size: Normal)   Pulse 67   Ht 5\' 4"  (1.626 m)   Wt 188 lb 6 oz (85.4 kg)   SpO2 98%   BMI 32.33 kg/m    Review of Systems She denies hypoglycemia    Objective:   Physical Exam VITAL SIGNS:  See vs page GENERAL: no distress Pulses: dorsalis pedis intact bilat.   MSK: no deformity of the feet CV: trace bilat leg edema Skin:  no ulcer on the feet.  normal color and temp on the feet.  Neuro: sensation is intact to touch on the feet, but decreased from normal.   Ext: there is bilateral onychomycosis of the toenails.    Lab Results  Component Value Date   HGBA1C 13.6 (H) 10/09/2019       Assessment & Plan:  Insulin-requiring type 2 DM: worse.   Dizziness: related to hyperglycemia, if it is related to DM at all.  Noncompliance with cbg recording and insulin.  She needs a simpler way of getting insulin.   Patient Instructions  The next 2 steps in your diabetes care are to get the insulin consistently, and to bring cbg record here.  Because you have health  insurance, I am sending a prescription for the Lantus.  Here is a discount card.  check your blood sugar  a day.  vary the time of day when you check, between before the 3 meals, and at bedtime.  also check if you have symptoms  of your blood sugar being too high or too low.  please keep a record of the readings and bring it to your next appointment here (or you can bring the meter itself).  You can write it on any piece of paper.  please call us sooner if your blood sugar goes below 70, or if you have a lot of readings over 200.  Please come back for a follow-up appointment in 6 weeks.

## 2019-11-15 NOTE — Progress Notes (Signed)
Established Patient Office Visit  Subjective:  Patient ID: [name], female    DOB: Nov 08, 1960  Age: 59 y.o. MRN: [id number]  CC:  Chief Complaint  Patient presents with  . Dizziness    since wednesday has been on and off    HPI [name] presents for acute/subacute issue of dizziness with onset this past Wednesday.  She states this started right before she got off work on Wednesday.  She works in the school system.  She describes this as more vertigo triggered with movement.  Her symptoms are actually somewhat improved today.  She has no symptoms currently.  There was no recent syncope.  No recent chest pains.  No associated nausea or vomiting.  Symptoms have been intermittent since onset.  No sudden hearing loss.  No speech change.  No visual changes.  No focal weakness.  She does have past history of CVA x2.  She called EMS yesterday and they came to her home and her blood sugar was about 325 but she was not taken in for further evaluation.  She does have type 2 diabetes which has been very poorly controlled.  She saw endocrinologist earlier today.  Last A1c 13.6 last month.  Patient was recently placed on Lipitor for poorly controlled hyperlipidemia but had dizziness reportedly with that and she stated she started herself back on pravastatin.  She does not describe any orthostatic type symptoms.  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 Abnormality of gait 05/10/2010  . BACK PAIN 11/14/2008  . CEREBROVASCULAR DISEASE 08/05/2008  . DIABETES MELLITUS, TYPE II 07/15/2008  . Diplopia 07/15/2008  . ECZEMA, ATOPIC 04/03/2009  . Guillain-Barre ([place])   . HYPERLIPIDEMIA 03/06/2009  . HYPERTENSION 07/15/2008  . Stroke ([place]) 2010   x2   . Vertebral artery stenosis     Past Surgical History:  Procedure Laterality Date  . ABDOMINAL HYSTERECTOMY    .  DILATION AND CURETTAGE OF UTERUS    . FOOT SURGERY      Family History  Problem Relation Age of Onset  . Diabetes Sister   . Asthma Other   . Stroke Other   . Hypertension Other   . Diabetes Mother   . Stroke Mother   . Cancer Father   . Breast cancer Neg Hx     Social History   Socioeconomic History  . Marital status: Single    Spouse name: Not on file  . Number of children: Not on file  . Years of education: Not on file  . Highest education level: Not on file  Occupational History  . Not on file  Tobacco Use  . Smoking status: Never Smoker  . Smokeless tobacco: Never Used  Vaping Use  . Vaping Use: Never used  Substance and Sexual Activity  . Alcohol use: No  . Drug use: No  . Sexual activity: Not on file  Other Topics Concern  . Not on file  Social History Narrative  . Not on file   Social Determinants of Health   Financial Resource Strain:   . Difficulty of Paying Living Expenses:   Food Insecurity:   . Worried About Charity fundraiser in the Last Year:   . Arboriculturist in the Last Year:   Transportation Needs:   . Film/video editor (Medical):   [name] Lack of Transportation (Non-Medical):   Physical Activity:   .  Days of Exercise per Week:   . Minutes of Exercise per Session:   Stress:   . Feeling of Stress :   Social Connections:   . Frequency of Communication with Friends and Family:   . Frequency of Social Gatherings with Friends and Family:   . Attends Religious Services:   . Active Member of Clubs or Organizations:   . Attends Archivist Meetings:   [name] Marital Status:   Intimate Partner Violence:   . Fear of Current or Ex-Partner:   . Emotionally Abused:   [name] Physically Abused:   . Sexually Abused:     Outpatient Medications Prior to Visit  Medication Sig Dispense Refill  . amLODipine (NORVASC) 10 MG tablet Take 1 tablet (10 mg total) by mouth daily. 90 tablet 3  . atorvastatin (LIPITOR) 40 MG tablet Take 1 tablet (40 mg  total) by mouth daily. 90 tablet 3  . Blood Pressure Monitoring (BLOOD PRESSURE MONITOR AUTOMAT) DEVI 1 Device by Does not apply route daily. 1 Device 0  . clopidogrel (PLAVIX) 75 MG tablet Take 1 tablet (75 mg total) by mouth daily. 90 tablet 3  . FLUoxetine (PROZAC) 20 MG capsule Take 1 capsule (20 mg total) by mouth daily. 90 capsule 2  . gabapentin (NEURONTIN) 300 MG capsule TAKE 1 CAPSULE BY MOUTH THREE TIMES DAILY 90 capsule 4  . glucose blood (CONTOUR NEXT TEST) test strip 1 each by Other route 2 (two) times daily. And lancets 2/day 200 each 3  . glucose blood (ONETOUCH VERIO) test strip USE TO CHECK BLOOD SUGAR TWICE A DAY AND PRN 100 each 6  . insulin glargine (LANTUS) 100 UNIT/ML Solostar Pen Inject 120 Units into the skin every morning. And pen needles 1/day 15 pen 3  . insulin lispro (HUMALOG) 100 UNIT/ML injection 15 U 5-10 min before meals or with meals.Extra Humalog insulin if sugars is over 150 as follow:Add 1 unit of Humalog if your sugar is 150-200. Add 3 units of HumaLog if your sugar is 201-250.Add 5 units of Humalog if your sugar is 251-300 Add 7 units of Humalog if your sugar is over 301 10 mL 3  . lisinopril-hydrochlorothiazide (ZESTORETIC) 20-12.5 MG tablet Take 2 tablets by mouth daily. 180 tablet 1  . omeprazole (PRILOSEC) 20 MG capsule Take 1 capsule by mouth once daily 90 capsule 0  . omeprazole (PRILOSEC) 20 MG capsule Take 1 capsule (20 mg total) by mouth daily. 30 capsule 3  . ONE TOUCH LANCETS MISC USE TO CHECK BLOOD SUGAR TWICE A DAY AND PRN 100 each 6  . triamcinolone ointment (KENALOG) 0.5 % Apply topically 2 (two) times daily as needed. 45 g 1   No facility-administered medications prior to visit.    No Known Allergies  ROS Review of Systems  Constitutional: Negative for appetite change, chills and fever.  Respiratory: Negative for cough and shortness of breath.   Cardiovascular: Negative for chest pain.  Neurological: Positive for dizziness. Negative for  seizures, syncope, facial asymmetry, speech difficulty, weakness and headaches.      Objective:    Physical Exam Vitals reviewed.  Constitutional:      Appearance: Normal appearance.  Cardiovascular:     Rate and Rhythm: Normal rate and regular rhythm.  Pulmonary:     Effort: Pulmonary effort is normal.     Breath sounds: Normal breath sounds.  Musculoskeletal:     Cervical back: Neck supple.     Right lower leg: No edema.  Left lower leg: No edema.  Neurological:     General: No focal deficit present.     Mental Status: She is alert and oriented to person, place, and time.     Cranial Nerves: No cranial nerve deficit.     Motor: No weakness.     Coordination: Coordination normal.     Gait: Gait normal.     BP 120/66 (BP Location: Left Arm, Patient Position: Sitting, Cuff Size: Normal)   Pulse 66   Temp 97.6 F (36.4 C) (Temporal)   Wt 188 lb 6.4 oz (85.5 kg)   SpO2 98%   BMI 32.34 kg/m  Wt Readings from Last 3 Encounters:  11/15/19 188 lb 6.4 oz (85.5 kg)  11/15/19 188 lb 6 oz (85.4 kg)  09/09/19 193 lb 1.6 oz (87.6 kg)     Health Maintenance Due  Topic Date Due  . COVID-19 Vaccine (1) Never done  . FOOT EXAM  03/24/2019    There are no preventive care reminders to display for this patient.  Lab Results  Component Value Date   TSH 1.14 09/11/2015   Lab Results  Component Value Date   WBC 5.6 10/15/2019   HGB 13.8 10/15/2019   HCT 41.9 10/15/2019   MCV 77.9 (L) 10/15/2019   PLT 232 10/15/2019   Lab Results  Component Value Date   NA 140 10/15/2019   K 5.0 10/15/2019   CO2 29 10/15/2019   GLUCOSE 404 (H) 10/15/2019   BUN 14 10/15/2019   CREATININE 1.03 (H) 10/15/2019   BILITOT 0.4 10/15/2019   ALKPHOS 100 10/15/2019   AST 15 10/15/2019   ALT 21 10/15/2019   PROT 8.2 (H) 10/15/2019   ALBUMIN 3.6 10/15/2019   CALCIUM 10.5 (H) 10/15/2019   ANIONGAP 11 10/15/2019   GFR 72.64 03/20/2019   Lab Results  Component Value Date   CHOL 240 (H)  10/09/2019   Lab Results  Component Value Date   HDL 37.50 (L) 10/09/2019   Lab Results  Component Value Date   LDLCALC 172 (H) 10/09/2019   Lab Results  Component Value Date   TRIG 152.0 (H) 10/09/2019   Lab Results  Component Value Date   CHOLHDL 6 10/09/2019   Lab Results  Component Value Date   HGBA1C 13.6 (H) 10/09/2019      Assessment & Plan:   #1 Patient presents with new symptom of transient vertigo which started Wednesday but is actually improved today.  Vertigo symptoms are not reproducible on exam.  She has nonfocal neuro exam at this time.  She does have history of cerebrovascular disease but does not describe any red flag symptoms such as focal weakness, ataxia, confusion, speech change, dysphagia, visual change, etc.  -Recommend observation for now.  We reviewed red flag symptoms to look out for for vertigo.  Hopefully this represents transient peripheral vertigo -Watch for any directional component to her vertigo if she has any recurrent symptoms.  If so, consider Epley maneuvers or referral for vestibular rehab  #2 poorly controlled type 2 diabetes.  History of poor compliance.  Recommend close follow-up with her primary and endocrinology for that  No orders of the defined types were placed in this encounter.   Follow-up: No follow-ups on file.    Carolann Littler, MD

## 2019-11-19 ENCOUNTER — Encounter (HOSPITAL_COMMUNITY): Payer: Self-pay | Admitting: Emergency Medicine

## 2019-11-19 ENCOUNTER — Emergency Department (HOSPITAL_COMMUNITY): Payer: BC Managed Care – PPO

## 2019-11-19 ENCOUNTER — Other Ambulatory Visit: Payer: Self-pay

## 2019-11-19 ENCOUNTER — Emergency Department (HOSPITAL_COMMUNITY)
Admission: EM | Admit: 2019-11-19 | Discharge: 2019-11-19 | Disposition: A | Discharge status: Home / Self Care | Payer: BC Managed Care – PPO | Attending: Emergency Medicine | Admitting: Emergency Medicine

## 2019-11-19 DIAGNOSIS — Z8673 Personal history of transient ischemic attack (TIA), and cerebral infarction without residual deficits: Secondary | ICD-10-CM | POA: Insufficient documentation

## 2019-11-19 DIAGNOSIS — R42 Dizziness and giddiness: Secondary | ICD-10-CM | POA: Diagnosis present

## 2019-11-19 DIAGNOSIS — G44319 Acute post-traumatic headache, not intractable: Secondary | ICD-10-CM | POA: Diagnosis not present

## 2019-11-19 DIAGNOSIS — E1140 Type 2 diabetes mellitus with diabetic neuropathy, unspecified: Secondary | ICD-10-CM | POA: Insufficient documentation

## 2019-11-19 DIAGNOSIS — R519 Headache, unspecified: Secondary | ICD-10-CM

## 2019-11-19 DIAGNOSIS — E11649 Type 2 diabetes mellitus with hypoglycemia without coma: Secondary | ICD-10-CM | POA: Diagnosis not present

## 2019-11-19 DIAGNOSIS — Z79899 Other long term (current) drug therapy: Secondary | ICD-10-CM | POA: Diagnosis not present

## 2019-11-19 DIAGNOSIS — Z794 Long term (current) use of insulin: Secondary | ICD-10-CM | POA: Insufficient documentation

## 2019-11-19 DIAGNOSIS — E162 Hypoglycemia, unspecified: Secondary | ICD-10-CM

## 2019-11-19 DIAGNOSIS — I10 Essential (primary) hypertension: Secondary | ICD-10-CM | POA: Insufficient documentation

## 2019-11-19 LAB — BASIC METABOLIC PANEL
Anion gap: 8 (ref 5–15)
BUN: 17 mg/dL (ref 6–20)
CO2: 28 mmol/L (ref 22–32)
Calcium: 9.9 mg/dL (ref 8.9–10.3)
Chloride: 105 mmol/L (ref 98–111)
Creatinine, Ser: 0.59 mg/dL (ref 0.44–1.00)
GFR calc Af Amer: >60 mL/min (ref >60)
GFR calc non Af Amer: >60 mL/min (ref >60)
Glucose, Bld: 94 mg/dL (ref 70–99)
Potassium: 3.8 mmol/L (ref 3.5–5.1)
Sodium: 141 mmol/L (ref 135–145)

## 2019-11-19 LAB — CBC
HCT: 40.4 % (ref 36.0–46.0)
Hemoglobin: 13 g/dL (ref 12.0–15.0)
MCH: 25.3 pg — ABNORMAL LOW (ref 26.0–34.0)
MCHC: 32.2 g/dL (ref 30.0–36.0)
MCV: 78.6 fL — ABNORMAL LOW (ref 80.0–100.0)
Platelets: 258 10*3/uL (ref 150–400)
RBC: 5.14 MIL/uL — ABNORMAL HIGH (ref 3.87–5.11)
RDW: 14.7 % (ref 11.5–15.5)
WBC: 6.2 10*3/uL (ref 4.0–10.5)
nRBC: 0 % (ref 0.0–0.2)

## 2019-11-19 LAB — CBG MONITORING, ED
Glucose-Capillary: 58 mg/dL — ABNORMAL LOW (ref 70–99)
Glucose-Capillary: 126 mg/dL — ABNORMAL HIGH (ref 70–99)

## 2019-11-19 LAB — TROPONIN I (HIGH SENSITIVITY)
Troponin I (High Sensitivity): 9 ng/L (ref <18)
Troponin I (High Sensitivity): 8 ng/L (ref <18)

## 2019-11-19 MED ORDER — HYDRALAZINE HCL 25 MG PO TABS
25.0000 mg | ORAL_TABLET | Freq: Once | ORAL | Status: AC
  Administered 2019-11-19: 25 mg via ORAL
  Filled 2019-11-19: qty 1

## 2019-11-19 MED ORDER — ACETAMINOPHEN 500 MG PO TABS: 1000.0000 mg | ORAL_TABLET | Freq: Four times a day (QID) | ORAL | 0 refills | Status: DC | PRN

## 2019-11-19 MED ORDER — ACETAMINOPHEN 500 MG PO TABS
1000.0000 mg | ORAL_TABLET | Freq: Once | ORAL | Status: AC
  Administered 2019-11-19: 1000 mg via ORAL
  Filled 2019-11-19: qty 2

## 2019-11-19 MED ORDER — MECLIZINE HCL 25 MG PO TABS
25.0000 mg | ORAL_TABLET | Freq: Three times a day (TID) | ORAL | 0 refills | Status: DC | PRN
Start: 2019-11-19 — End: 2020-04-15

## 2019-11-19 MED ORDER — MECLIZINE HCL 25 MG PO TABS
25.0000 mg | ORAL_TABLET | Freq: Once | ORAL | Status: AC
  Administered 2019-11-19: 25 mg via ORAL
  Filled 2019-11-19: qty 1

## 2019-11-19 NOTE — ED Provider Notes (Signed)
[facility] DEPT Provider Note   CSN: [id number] Arrival date & time: 11/19/19  0725     History Chief Complaint  Patient presents with  . Headache    [name] is a 59 y.o. female.  HPI Patient reports that she has been getting some episodes of dizziness.  Intermittently she feels off balance like things are moving but does not have problems staggering or falling.  She reports this morning she was up and getting ready for work.  She ate an egg and hotdogs.  She took her morning blood pressure medications per usual.  She reports she was driving to work and very suddenly started to feel dizzy.  She also had a pressure-like headache in the back of her head that came up to the top.  She denies a headache was severe, she describes it as a throbbing quality.  Reports she also felt there were some white spots in her vision.  He denies she had double vision or loss of any vision.  She reports something similar has happened in the past when she had a stroke.  She reports history of 2 prior strokes.  He denies she feels any weakness numbness or incoordination of her extremities.  She has not had difficulty walking.  She reports she was able to drive herself to her sister's house and then had her nephew bring her to the hospital.    Past Medical History:  Diagnosis Date  . Abnormality of gait 05/10/2010  . BACK PAIN 11/14/2008  . CEREBROVASCULAR DISEASE 08/05/2008  . DIABETES MELLITUS, TYPE II 07/15/2008  . Diplopia 07/15/2008  . ECZEMA, ATOPIC 04/03/2009  . Guillain-Barre ([place])   . HYPERLIPIDEMIA 03/06/2009  . HYPERTENSION 07/15/2008  . Stroke ([place]) 2010   x2   . Vertebral artery stenosis     Patient Active Problem List   Diagnosis Date Noted  . 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 Trigger finger, left ring finger 10/04/2018  . Alternating constipation and diarrhea 04/20/2018  . Diabetic peripheral neuropathy associated with type 2 diabetes mellitus  ([place]) 02/26/2018  . DM (diabetes mellitus), type 2 with peripheral vascular complications ([place]) 48/54/6270  . ABNORMALITY OF GAIT 05/10/2010  . ECZEMA, ATOPIC 04/03/2009  . Hyperlipidemia associated with type 2 diabetes mellitus ([place]) 03/06/2009  . BACK PAIN 11/14/2008  . Cerebrovascular disease 08/05/2008  . DIPLOPIA 07/15/2008  . Essential hypertension 07/15/2008    Past Surgical History:  Procedure Laterality Date  . ABDOMINAL HYSTERECTOMY    . DILATION AND CURETTAGE OF UTERUS    . FOOT SURGERY       OB History   No obstetric history on file.     Family History  Problem Relation Age of Onset  . Diabetes Sister   . Asthma Other   . Stroke Other   . Hypertension Other   . Diabetes Mother   . Stroke Mother   . Cancer Father   . Breast cancer Neg Hx     Social History   Tobacco Use  . Smoking status: Never Smoker  . Smokeless tobacco: Never Used  Vaping Use  . Vaping Use: Never used  Substance Use Topics  . Alcohol use: No  . Drug use: No    Home Medications Prior to Admission medications   Medication Sig Start Date End Date Taking? Authorizing Provider  amLODipine (NORVASC) 10 MG tablet Take 1 tablet (10 mg total) by mouth daily. 10/10/19  Yes [name], [name], MD  atorvastatin (LIPITOR)  40 MG tablet Take 1 tablet (40 mg total) by mouth daily. 10/11/19  Yes [name], [name], MD  clopidogrel (PLAVIX) 75 MG tablet Take 1 tablet (75 mg total) by mouth daily. 05/05/17  Yes [name], MD  FLUoxetine (PROZAC) 20 MG capsule Take 1 capsule (20 mg total) by mouth daily. 09/04/19  Yes [name], [name], MD  gabapentin (NEURONTIN) 300 MG capsule TAKE 1 CAPSULE BY MOUTH THREE TIMES DAILY Patient taking differently: Take 300 mg by mouth daily.  09/04/19  Yes [name], [name], MD  insulin glargine (LANTUS) 100 UNIT/ML Solostar Pen Inject 120 Units into the skin every morning. And pen needles 1/day 11/15/19  Yes [name], MD  insulin lispro (HUMALOG) 100 UNIT/ML injection  15 U 5-10 min before meals or with meals.Extra Humalog insulin if sugars is over 150 as follow:Add 1 unit of Humalog if your sugar is 150-200. Add 3 units of HumaLog if your sugar is 201-250.Add 5 units of Humalog if your sugar is 251-300 Add 7 units of Humalog if your sugar is over 301 Patient taking differently: Inject 0-12 Units into the skin 3 (three) times daily with meals. 15 U 5-10 min before meals or with meals.Extra Humalog insulin if sugars is over 150 as follow:Add 1 unit of Humalog if your sugar is 150-200. Add 3 units of HumaLog if your sugar is 201-250.Add 5 units of Humalog if your sugar is 251-300 Add 7 units of Humalog if your sugar is over 301 10/10/19  Yes [name], [name], MD  lisinopril-hydrochlorothiazide (ZESTORETIC) 20-12.5 MG tablet Take 2 tablets by mouth daily. Patient taking differently: Take 1 tablet by mouth daily.  10/08/19  Yes [name], [name], MD  omeprazole (PRILOSEC) 20 MG capsule Take 1 capsule (20 mg total) by mouth daily. 10/29/18  Yes [name], [name], MD  acetaminophen (TYLENOL) 500 MG tablet Take 2 tablets (1,000 mg total) by mouth every 6 (six) hours as needed. 11/19/19   [name], MD  Blood Pressure Monitoring (BLOOD PRESSURE MONITOR AUTOMAT) DEVI 1 Device by Does not apply route daily. 08/29/18   [name], [name], MD  glucose blood (CONTOUR NEXT TEST) test strip 1 each by Other route 2 (two) times daily. And lancets 2/day 03/09/18   [name], MD  glucose blood [facility] VERIO) test strip USE TO CHECK BLOOD SUGAR TWICE A DAY AND PRN 07/29/15   [name], MD  meclizine (ANTIVERT) 25 MG tablet Take 1 tablet (25 mg total) by mouth 3 (three) times daily as needed for dizziness. 11/19/19   [name], MD  omeprazole (PRILOSEC) 20 MG capsule Take 1 capsule by mouth once daily Patient not taking: Reported on 11/19/2019 10/30/18   [name], [name], MD  ONE TOUCH LANCETS MISC USE TO CHECK BLOOD SUGAR TWICE A DAY AND PRN 07/29/15   [name], MD    triamcinolone ointment (KENALOG) 0.5 % Apply topically 2 (two) times daily as needed. 10/23/19   [name], [name], MD    Allergies    Patient has no known allergies.  Review of Systems   Review of Systems 10 systems reviewed and negative except as per HPI Physical Exam Updated Vital Signs BP (!) 148/76   Pulse 63   Temp 97.6 F (36.4 C) (Oral)   Resp 15   Ht 5\' 4"  (1.626 m)   Wt 85.3 kg   SpO2 96%   BMI 32.27 kg/m   Physical Exam Constitutional:      Appearance:  She is well-developed.  HENT:     Head: Normocephalic and atraumatic.     Right Ear: Tympanic membrane normal.     Left Ear: Tympanic membrane normal.     Mouth/Throat:     Mouth: Mucous membranes are moist.     Pharynx: Oropharynx is clear.  Eyes:     Extraocular Movements: Extraocular movements intact.     Conjunctiva/sclera: Conjunctivae normal.     Pupils: Pupils are equal, round, and reactive to light.  Cardiovascular:     Rate and Rhythm: Normal rate and regular rhythm.     Heart sounds: Normal heart sounds.  Pulmonary:     Effort: Pulmonary effort is normal.     Breath sounds: Normal breath sounds.  Abdominal:     General: Bowel sounds are normal. There is no distension.     Palpations: Abdomen is soft.     Tenderness: There is no abdominal tenderness.  Musculoskeletal:        General: Normal range of motion.     Cervical back: Neck supple.  Skin:    General: Skin is warm and dry.  Neurological:     General: No focal deficit present.     Mental Status: She is alert and oriented to person, place, and time.     GCS: GCS eye subscore is 4. GCS verbal subscore is 5. GCS motor subscore is 6.     Cranial Nerves: No cranial nerve deficit.     Sensory: No sensory deficit.     Motor: No weakness.     Coordination: Coordination normal.     Comments: Speech normal.  Cognitive function normal.  Movements are coordinated purposeful symmetric.  Normal heel shin exam.  Motor strength 5\5x4 extremities.   Psychiatric:        Mood and Affect: Mood normal.     ED Results / Procedures / Treatments   Labs (all labs ordered are listed, but only abnormal results are displayed) Labs Reviewed  CBC - Abnormal; Notable for the following components:      Result Value   RBC 5.14 (*)    MCV 78.6 (*)    MCH 25.3 (*)    All other components within normal limits  CBG MONITORING, ED - Abnormal; Notable for the following components:   Glucose-Capillary 58 (*)    All other components within normal limits  BASIC METABOLIC PANEL  TROPONIN I (HIGH SENSITIVITY)  TROPONIN I (HIGH SENSITIVITY)    EKG EKG Interpretation  Date/Time:  Tuesday November 19 2019 07:41:42 EDT Ventricular Rate:  60 PR Interval:    QRS Duration: 95 QT Interval:  429 QTC Calculation: 429 R Axis:   9 Text Interpretation: Sinus rhythm Left ventricular hypertrophy no STEMI, nonspecific ST flattening diffusely. noold comparison Confirmed by [name] [phone]) on 11/19/2019 11:41:10 AM   Radiology MR BRAIN WO CONTRAST  Result Date: 11/19/2019 CLINICAL DATA:  Central vertigo. EXAM: MRI HEAD WITHOUT CONTRAST TECHNIQUE: Multiplanar, multiecho pulse sequences of the brain and surrounding structures were obtained without intravenous contrast. COMPARISON:  MRI head 05/10/2010 FINDINGS: Brain: Image quality degraded by mild motion. Rapid sequence scanning was performed to expedite the study. Negative for acute infarct. Scattered small deep white matter hyperintensities bilaterally have progressed in the interval. Previously noted infarct in the right cerebellum is difficult to see on today's study. Ventricle size and cerebral volume normal. Negative for hemorrhage or mass. Vascular: Normal arterial flow voids Skull and upper cervical spine: No focal skeletal lesion. Sinuses/Orbits: Paranasal  sinuses clear.  Negative orbit Other: None IMPRESSION: No acute abnormality Chronic microvascular ischemic change in the white matter progressed.  Chronic infarct right cerebellum not well visualized Motion degraded study. Electronically Signed   By: Franchot Gallo M.D.   On: 11/19/2019 14:03    Procedures Procedures (including critical care time)  Medications Ordered in ED Medications  meclizine (ANTIVERT) tablet 25 mg (25 mg Oral Given 11/19/19 1212)  hydrALAZINE (APRESOLINE) tablet 25 mg (25 mg Oral Given 11/19/19 1212)  acetaminophen (TYLENOL) tablet 1,000 mg (1,000 mg Oral Given 11/19/19 1212)    ED Course  I have reviewed the triage vital signs and the nursing notes.  Pertinent labs & imaging results that were available during my care of the patient were reviewed by me and considered in my medical decision making (see chart for details).    MDM Rules/Calculators/A&P                         Patient presents with the vertigo type symptoms and associated posterior headache.  Headache is mild in nature.  She does not describe thunderclap, sudden severe or debilitating headache.  She denies that she had loss of vision.  MRI obtained to rule out cerebellar stroke.  Patient has history of cerebellar CVA.  Negative for acute findings.  Patient felt improved after meclizine.  She did however become diaphoretic later in her stay in the emergency department.  She reported she was probably hypoglycemic because she had not eaten.  Blood sugar checked and 58.  Patient was fed and symptoms resolved.  At this time feel patient is safe for discharge.  Will prescribe meclizine to use as needed.  She is advised to follow-up with her PCP within a week for recheck and further referrals if indicated for symptomatic control.  Return precautions reviewed. Final Clinical Impression(s) / ED Diagnoses Final diagnoses:  Vertigo  Acute nonintractable headache, unspecified headache type  H/O: CVA (cerebrovascular accident)  Hypoglycemia    Rx / DC Orders ED Discharge Orders         Ordered    meclizine (ANTIVERT) 25 MG tablet  3 times daily PRN      Discontinue  Reprint     11/19/19 1539    acetaminophen (TYLENOL) 500 MG tablet  Every 6 hours PRN     Discontinue  Reprint     11/19/19 1539           [name], MD 11/19/19 1546

## 2019-11-19 NOTE — ED Triage Notes (Signed)
Per pt, states she was on her way to work when she felt dizzy-states headache from the back of her head to the front-BP elevated, took meds this am-denies CP

## 2019-11-19 NOTE — Discharge Instructions (Signed)
1.  Your MRI did not show any evidence of a stroke.  Your dizziness appears likely due to peripheral vertigo.  This is a condition that comes and goes.  It may last for brief episodes were more prolonged.  You may take meclizine as prescribed for dizziness that has a spinning or disequilibrium quality to it.  Follow-up with your doctor as soon as possible to discuss continued evaluation and treatment.  Sometimes vertigo is treated by ear nose throat specialist and requires a referral. 2.  Return to the emergency department if you develop new, worsening or concerning symptoms.

## 2019-11-19 NOTE — ED Notes (Signed)
Pt given orange juice and crackers

## 2019-11-19 NOTE — ED Notes (Signed)
Pt states she feels "dizzy and sweaty"

## 2019-12-05 ENCOUNTER — Telehealth: Payer: Self-pay | Admitting: Family Medicine

## 2019-12-05 NOTE — Telephone Encounter (Signed)
Pt is calling in stating that she needs a 90 day supply of insulin glargine (LANTUS) 100 unit Solostar Pen from  [employer] [facility] [phone].  And would like to have a call when they arrive in the office.

## 2019-12-06 MED ORDER — INSULIN GLARGINE 100 UNIT/ML SOLOSTAR PEN: 120.0000 [IU] | PEN_INJECTOR | SUBCUTANEOUS | 1 refills | Status: DC

## 2019-12-06 NOTE — Telephone Encounter (Signed)
Rx printed to be signed by pcp.

## 2019-12-06 NOTE — Telephone Encounter (Signed)
Faxed, will notify pt once medication arrives.

## 2019-12-07 ENCOUNTER — Other Ambulatory Visit: Payer: Self-pay | Admitting: Family Medicine

## 2019-12-11 NOTE — Telephone Encounter (Signed)
I left patient a voicemail. Pt needs to come by the office and sign the application before we can fax it in. Pt's previous application has expired.

## 2019-12-16 NOTE — Telephone Encounter (Signed)
Patient called the office back. Will try to come by today or tomorrow morning to sign the papers.

## 2019-12-16 NOTE — Telephone Encounter (Signed)
I left patient another voicemail to come by the office to sign the patient authorization form. The insulin cannot be ordered until she signs it & we fax it back.

## 2019-12-17 NOTE — Telephone Encounter (Signed)
Form has been signed & faxed with confirmation that it was received.

## 2019-12-18 ENCOUNTER — Ambulatory Visit: Payer: BC Managed Care – PPO | Admitting: Family Medicine

## 2019-12-18 ENCOUNTER — Other Ambulatory Visit: Payer: Self-pay

## 2019-12-18 ENCOUNTER — Encounter: Payer: Self-pay | Admitting: Family Medicine

## 2019-12-18 VITALS — BP 128/80 | HR 75 | Temp 98.3°F | Resp 16 | Ht 64.0 in | Wt 167.4 lb

## 2019-12-18 DIAGNOSIS — W57XXXA Bitten or stung by nonvenomous insect and other nonvenomous arthropods, initial encounter: Secondary | ICD-10-CM | POA: Diagnosis not present

## 2019-12-18 DIAGNOSIS — S40861A Insect bite (nonvenomous) of right upper arm, initial encounter: Secondary | ICD-10-CM

## 2019-12-18 DIAGNOSIS — L298 Other pruritus: Secondary | ICD-10-CM | POA: Diagnosis not present

## 2019-12-18 DIAGNOSIS — E1151 Type 2 diabetes mellitus with diabetic peripheral angiopathy without gangrene: Secondary | ICD-10-CM | POA: Diagnosis not present

## 2019-12-18 MED ORDER — FAMOTIDINE 20 MG PO TABS: 20.0000 mg | ORAL_TABLET | Freq: Two times a day (BID) | ORAL | 0 refills | Status: DC

## 2019-12-18 MED ORDER — HYDROXYZINE HCL 25 MG PO TABS: 25.0000 mg | ORAL_TABLET | Freq: Three times a day (TID) | ORAL | 0 refills | Status: AC | PRN

## 2019-12-18 MED ORDER — METHYLPREDNISOLONE ACETATE 80 MG/ML IJ SUSP
40.0000 mg | Freq: Once | INTRAMUSCULAR | Status: AC
  Administered 2019-12-18: 40 mg via INTRAMUSCULAR

## 2019-12-18 NOTE — Patient Instructions (Addendum)
A few things to remember from today's visit:   Pruritic erythematous rash - Plan: famotidine (PEPCID) 20 MG tablet, hydrOXYzine (ATARAX/VISTARIL) 25 MG tablet  DM (diabetes mellitus), type 2 with peripheral vascular complications (HCC)  Monitor blood sugar closely for the next 2 weeks. Medications prescribed today can cause drowsiness. Apply topical steroid. Keep finger nails clean. Monitor for signs of infection.  If you need refills please call your pharmacy. Do not use My Chart to request refills or for acute issues that need immediate attention.   Insect Bite, Adult An insect bite can make your skin red, itchy, and swollen. Some insects can spread disease to people with a bite. However, most insect bites do not lead to disease, and most are not serious. What are the causes? Insects may bite for many reasons, including:  Hunger.  To defend themselves. Insects that bite include:  Spiders.  Mosquitoes.  Ticks.  Fleas.  Ants.  Flies.  Kissing bugs.  Chiggers. What are the signs or symptoms? Symptoms of this condition include:  Itching or pain in the bite area.  Redness and swelling in the bite area.  An open wound (skin ulcer). Symptoms often last for 2-4 days. In rare cases, a person may have a very bad allergic reaction (anaphylactic reaction) to a bite. Symptoms of an anaphylactic reaction may include:  Feeling warm in the face (flushed). Your face may turn red.  Itchy, red, swollen areas of skin (hives).  Swelling of the: ? Eyes. ? Lips. ? Face. ? Mouth. ? Tongue. ? Throat.  Trouble with any of these: ? Breathing. ? Talking. ? Swallowing.  Loud breathing (wheezing).  Feeling dizzy or light-headed.  Passing out (fainting).  Pain or cramps in your belly.  Throwing up (vomiting).  Watery poop (diarrhea). How is this treated? Treatment is usually not needed. Symptoms often go away on their own. When treatment is needed, it may  involve:  Putting a cream or lotion on the bite area. This helps with itching.  Taking an antibiotic medicine. This treatment is needed if the bite area gets infected.  Getting a tetanus shot, if you are not up to date on this vaccine.  Putting ice on the affected area.  Using medicines called antihistamines. This treatment may be needed if you have itching or an allergic reaction to the insect bite.  Giving yourself a shot of medicine (epinephrine) using an auto-injector "pen" if you have an anaphylactic reaction to a bite. Your doctor will teach you how to use this pen. Follow these instructions at home: Bite area care   Do not scratch the bite area.  Keep the bite area clean and dry.  Wash the bite area every day with soap and water as told by your doctor.  Check the bite area every day for signs of infection. Check for: ? Redness, swelling, or pain. ? Fluid or blood. ? Warmth. ? Pus or a bad smell. Managing pain, itching, and swelling   You may put any of these on the bite area as told by your doctor: ? A paste made of baking soda and water. ? Cortisone cream. ? Calamine lotion.  If told, put ice on the bite area. ? Put ice in a plastic bag. ? Place a towel between your skin and the bag. ? Leave the ice on for 20 minutes, 2-3 times a day. General instructions  Apply or take over-the-counter and prescription medicines only as told by your doctor.  If you were prescribed  an antibiotic medicine, take or apply it as told by your doctor. Do not stop using the antibiotic even if your condition improves.  Keep all follow-up visits as told by your doctor. This is important. How is this prevented? To help you have a lower risk of insect bites:  When you are outside, wear clothing that covers your arms and legs.  Use insect repellent. The best insect repellents contain one of these: ? DEET. ? Picaridin. ? Oil of lemon eucalyptus (OLE). ? [id number].  Consider spraying  your clothing with a pesticide called permethrin. Permethrin helps prevent insect bites. It works for several weeks and for up to 5-6 clothing washes. Do not apply permethrin directly to the skin.  If your home windows do not have screens, think about putting some in.  If you will be sleeping in an area where there are mosquitoes, consider covering your sleeping area with a mosquito net. Contact a doctor if:  You have redness, swelling, or pain in the bite area.  You have fluid or blood coming from the bite area.  The bite area feels warm to the touch.  You have pus or a bad smell coming from the bite area.  You have a fever. Get help right away if:  You have joint pain.  You have a rash.  You feel more tired or sleepy than you normally do.  You have neck pain.  You have a headache.  You feel weaker than you normally do.  You have signs of an anaphylactic reaction. Signs may include: ? Feeling warm in the face. ? Itchy, red, swollen areas of skin. ? Swelling of your:  Eyes.  Lips.  Face.  Mouth.  Tongue.  Throat. ? Trouble with any of these:  Breathing.  Talking.  Swallowing. ? Loud breathing. ? Feeling dizzy or light-headed. ? Passing out. ? Pain or cramps in your belly. ? Throwing up. ? Watery poop. These symptoms may be an emergency. Do not wait to see if the symptoms will go away. Do this right away:  Use your auto-injector pen as you have been told.  Get medical help. Call your local emergency services (911 in the [place] Do not drive yourself to the hospital. Summary  An insect bite can make your skin red, itchy, and swollen.  Treatment is usually not needed. Symptoms often go away on their own.  Do not scratch the bite area. Keep it clean and dry.  Ice can help with pain and itching from the bite. This information is not intended to replace advice given to you by your health care provider. Make sure you discuss any questions you have with  your health care provider. Document Revised: 10/27/2017 Document Reviewed: 10/27/2017 Elsevier Patient Education  [place].  Please be sure medication list is accurate. If a new problem present, please set up appointment sooner than planned today.

## 2019-12-18 NOTE — Progress Notes (Signed)
Chief Complaint  Patient presents with  . Rash   HPI: [name] is a 59 y.o. female with hx of DM II,CVD,HTN,allergies,and lymphocytic lymphoma here today with above complaint. Erythematous and pruritic lesions scattered on body, right side seems to be more affected. Problem started 3 days ago. She thinks lesions were caused by insect bites. She noted a "black,small" insect on right arm, engorged with blood.  1-2 days before problem started she spent the night with a friend, slept on the couch on her right side.  Negative for new medications, detergent, soap, or body product. No known outdoor exposures to plants.  No sick contact. She has hx of eczema but lesions and localization are different.  OTC medication for this problem: None. She tried topical steroid she uses for eczema.  Negative for fever,oral lesions/edema,sore throat,stridor,cough, wheezing, dyspnea, abdominal pain, nausea, or vomiting. She was last seen on 10/08/19. Since her last visit she has been seen for dizziness x 2. Dx'ed with vertigo, feeling better.  She also established with endocrinologist, treatment was adjusted . Reporting that BS's are "better."  Review of Systems  Constitutional: Positive for fatigue. Negative for appetite change.  HENT: Negative for congestion, mouth sores, trouble swallowing and voice change.   Eyes: Negative for discharge and redness.  Cardiovascular: Negative for chest pain, palpitations and leg swelling.  Gastrointestinal: Negative for abdominal pain.  Musculoskeletal: Negative for gait problem and myalgias.  Skin: Positive for rash.  Allergic/Immunologic: Positive for environmental allergies.  Neurological: Negative for weakness and headaches.  Rest see pertinent positives and negatives per HPI.  Current Outpatient Medications on File Prior to Visit  Medication Sig Dispense Refill  . acetaminophen (TYLENOL) 500 MG tablet Take 2 tablets (1,000 mg total) by  mouth every 6 (six) hours as needed. 30 tablet 0  . amLODipine (NORVASC) 10 MG tablet Take 1 tablet (10 mg total) by mouth daily. 90 tablet 3  . atorvastatin (LIPITOR) 40 MG tablet Take 1 tablet (40 mg total) by mouth daily. 90 tablet 3  . Blood Pressure Monitoring (BLOOD PRESSURE MONITOR AUTOMAT) DEVI 1 Device by Does not apply route daily. 1 Device 0  . clopidogrel (PLAVIX) 75 MG tablet Take 1 tablet (75 mg total) by mouth daily. 90 tablet 3  . FLUoxetine (PROZAC) 20 MG capsule Take 1 capsule (20 mg total) by mouth daily. 90 capsule 2  . gabapentin (NEURONTIN) 300 MG capsule TAKE 1 CAPSULE BY MOUTH THREE TIMES DAILY (Patient taking differently: Take 300 mg by mouth daily. ) 90 capsule 4  . glucose blood (CONTOUR NEXT TEST) test strip 1 each by Other route 2 (two) times daily. And lancets 2/day 200 each 3  . glucose blood (ONETOUCH VERIO) test strip USE TO CHECK BLOOD SUGAR TWICE A DAY AND PRN 100 each 6  . insulin glargine (LANTUS) 100 UNIT/ML Solostar Pen Inject 120 Units into the skin every morning. And pen needles 1/day 105 mL 1  . insulin lispro (HUMALOG) 100 UNIT/ML injection 15 U 5-10 min before meals or with meals.Extra Humalog insulin if sugars is over 150 as follow:Add 1 unit of Humalog if your sugar is 150-200. Add 3 units of HumaLog if your sugar is 201-250.Add 5 units of Humalog if your sugar is 251-300 Add 7 units of Humalog if your sugar is over 301 (Patient taking differently: Inject 0-12 Units into the skin 3 (three) times daily with meals. 15 U 5-10 min before meals or with meals.Extra Humalog insulin if sugars is  over 150 as follow:Add 1 unit of Humalog if your sugar is 150-200. Add 3 units of HumaLog if your sugar is 201-250.Add 5 units of Humalog if your sugar is 251-300 Add 7 units of Humalog if your sugar is over 301) 10 mL 3  . lisinopril-hydrochlorothiazide (ZESTORETIC) 20-12.5 MG tablet Take 2 tablets by mouth daily. (Patient taking differently: Take 1 tablet by mouth daily. )  180 tablet 1  . meclizine (ANTIVERT) 25 MG tablet Take 1 tablet (25 mg total) by mouth 3 (three) times daily as needed for dizziness. 30 tablet 0  . omeprazole (PRILOSEC) 20 MG capsule Take 1 capsule (20 mg total) by mouth daily. 30 capsule 3  . omeprazole (PRILOSEC) 20 MG capsule Take 1 capsule by mouth once daily 90 capsule 2  . ONE TOUCH LANCETS MISC USE TO CHECK BLOOD SUGAR TWICE A DAY AND PRN 100 each 6  . triamcinolone ointment (KENALOG) 0.5 % Apply topically 2 (two) times daily as needed. 45 g 1   No current facility-administered medications on file prior to visit.   Past Medical History:  Diagnosis Date  . Abnormality of gait 05/10/2010  . BACK PAIN 11/14/2008  . CEREBROVASCULAR DISEASE 08/05/2008  . DIABETES MELLITUS, TYPE II 07/15/2008  . Diplopia 07/15/2008  . ECZEMA, ATOPIC 04/03/2009  . Guillain-Barre ([place])   . HYPERLIPIDEMIA 03/06/2009  . HYPERTENSION 07/15/2008  . Stroke ([place]) 2010   x2   . Vertebral artery stenosis    No Known Allergies  Social History   Socioeconomic History  . Marital status: Single    Spouse name: Not on file  . Number of children: Not on file  . Years of education: Not on file  . Highest education level: Not on file  Occupational History  . Not on file  Tobacco Use  . Smoking status: Never Smoker  . Smokeless tobacco: Never Used  Vaping Use  . Vaping Use: Never used  Substance and Sexual Activity  . Alcohol use: No  . Drug use: No  . Sexual activity: Not on file  Other Topics Concern  . Not on file  Social History Narrative  . Not on file   Social Determinants of Health   Financial Resource Strain:   . Difficulty of Paying Living Expenses: Not on file  Food Insecurity:   . Worried About Charity fundraiser in the Last Year: Not on file  . Ran Out of Food in the Last Year: Not on file  Transportation Needs:   . Lack of Transportation (Medical): Not on file  . Lack of Transportation (Non-Medical): Not on file  Physical Activity:     . Days of Exercise per Week: Not on file  . Minutes of Exercise per Session: Not on file  Stress:   . Feeling of Stress : Not on file  Social Connections:   . Frequency of Communication with Friends and Family: Not on file  . Frequency of Social Gatherings with Friends and Family: Not on file  . Attends Religious Services: Not on file  . Active Member of Clubs or Organizations: Not on file  . Attends Archivist Meetings: Not on file  . Marital Status: Not on file   Vitals:   12/18/19 1419  BP: 128/80  Pulse: 75  Resp: 16  Temp: 98.3 F (36.8 C)  SpO2: 97%   Body mass index is 28.73 kg/m.  Physical Exam Vitals and nursing note reviewed.  Constitutional:      General:  She is not in acute distress.    Appearance: She is well-developed.  HENT:     Head: Normocephalic and atraumatic.     Mouth/Throat:     Mouth: Mucous membranes are moist.     Pharynx: Oropharynx is clear.  Eyes:     Conjunctiva/sclera: Conjunctivae normal.  Cardiovascular:     Rate and Rhythm: Normal rate and regular rhythm.  Pulmonary:     Effort: Pulmonary effort is normal. No respiratory distress.     Breath sounds: Normal breath sounds.  Lymphadenopathy:     Cervical: No cervical adenopathy.  Skin:    General: Skin is warm.     Findings: Rash present. Rash is papular. Rash is not vesicular.       Neurological:     Mental Status: She is alert and oriented to person, place, and time.  Psychiatric:        Mood and Affect: Mood and affect normal.     Comments: Well groomed, good eye contact.    ASSESSMENT AND PLAN:  Ms.Sparkles was seen today for rash.  Diagnoses and all orders for this visit:  Pruritic erythematous rash No signs of infectious process. Options in regard to steroid treatment discussed, oral for a few days vs injection; poorly controlled DM II. She agrees with parenteral treatment. We discussed side effects of medications. Instructed about warning signs.  -      famotidine (PEPCID) 20 MG tablet; Take 1 tablet (20 mg total) by mouth 2 (two) times daily for 14 days. -     hydrOXYzine (ATARAX/VISTARIL) 25 MG tablet; Take 1 tablet (25 mg total) by mouth 3 (three) times daily as needed for up to 10 days for itching. -     methylPREDNISolone acetate (DEPO-MEDROL) injection 40 mg  Insect bite, unspecified site, initial encounter ? Bed bugs. Keeps finger nails clean and avoid scratching.  -     famotidine (PEPCID) 20 MG tablet; Take 1 tablet (20 mg total) by mouth 2 (two) times daily for 14 days. -     hydrOXYzine (ATARAX/VISTARIL) 25 MG tablet; Take 1 tablet (25 mg total) by mouth 3 (three) times daily as needed for up to 10 days for itching.  DM (diabetes mellitus), type 2 with peripheral vascular complications (HCC) Side effects of steroids discussed. Recommend close monitoring of BS's for the next 2 weeks. Following with endocrinologist.   Return if symptoms worsen or fail to improve.   Eri Mcevers G. [name], MD  [facility]. [place] office.   A few things to remember from today's visit:  Monitor blood sugar closely for the next 2 weeks. Medications prescribed today can cause drowsiness. Apply topical steroid. Keep finger nails clean. Monitor for signs of infection.  If you need refills please call your pharmacy. Do not use My Chart to request refills or for acute issues that need immediate attention.   Insect Bite, Adult An insect bite can make your skin red, itchy, and swollen. Some insects can spread disease to people with a bite. However, most insect bites do not lead to disease, and most are not serious. What are the causes? Insects may bite for many reasons, including:  Hunger.  To defend themselves. Insects that bite include:  Spiders.  Mosquitoes.  Ticks.  Fleas.  Ants.  Flies.  Kissing bugs.  Chiggers. What are the signs or symptoms? Symptoms of this condition include:  Itching or pain in the bite  area.  Redness and swelling in the bite area.  An open wound (skin ulcer). Symptoms often last for 2-4 days. In rare cases, a person may have a very bad allergic reaction (anaphylactic reaction) to a bite. Symptoms of an anaphylactic reaction may include:  Feeling warm in the face (flushed). Your face may turn red.  Itchy, red, swollen areas of skin (hives).  Swelling of the: ? Eyes. ? Lips. ? Face. ? Mouth. ? Tongue. ? Throat.  Trouble with any of these: ? Breathing. ? Talking. ? Swallowing.  Loud breathing (wheezing).  Feeling dizzy or light-headed.  Passing out (fainting).  Pain or cramps in your belly.  Throwing up (vomiting).  Watery poop (diarrhea). How is this treated? Treatment is usually not needed. Symptoms often go away on their own. When treatment is needed, it may involve:  Putting a cream or lotion on the bite area. This helps with itching.  Taking an antibiotic medicine. This treatment is needed if the bite area gets infected.  Getting a tetanus shot, if you are not up to date on this vaccine.  Putting ice on the affected area.  Using medicines called antihistamines. This treatment may be needed if you have itching or an allergic reaction to the insect bite.  Giving yourself a shot of medicine (epinephrine) using an auto-injector "pen" if you have an anaphylactic reaction to a bite. Your doctor will teach you how to use this pen. Follow these instructions at home: Bite area care   Do not scratch the bite area.  Keep the bite area clean and dry.  Wash the bite area every day with soap and water as told by your doctor.  Check the bite area every day for signs of infection. Check for: ? Redness, swelling, or pain. ? Fluid or blood. ? Warmth. ? Pus or a bad smell. Managing pain, itching, and swelling   You may put any of these on the bite area as told by your doctor: ? A paste made of baking soda and water. ? Cortisone  cream. ? Calamine lotion.  If told, put ice on the bite area. ? Put ice in a plastic bag. ? Place a towel between your skin and the bag. ? Leave the ice on for 20 minutes, 2-3 times a day. General instructions  Apply or take over-the-counter and prescription medicines only as told by your doctor.  If you were prescribed an antibiotic medicine, take or apply it as told by your doctor. Do not stop using the antibiotic even if your condition improves.  Keep all follow-up visits as told by your doctor. This is important. How is this prevented? To help you have a lower risk of insect bites:  When you are outside, wear clothing that covers your arms and legs.  Use insect repellent. The best insect repellents contain one of these: ? DEET. ? Picaridin. ? Oil of lemon eucalyptus (OLE). ? IR3535.  Consider spraying your clothing with a pesticide called permethrin. Permethrin helps prevent insect bites. It works for several weeks and for up to 5-6 clothing washes. Do not apply permethrin directly to the skin.  If your home windows do not have screens, think about putting some in.  If you will be sleeping in an area where there are mosquitoes, consider covering your sleeping area with a mosquito net. Contact a doctor if:  You have redness, swelling, or pain in the bite area.  You have fluid or blood coming from the bite area.  The bite area feels warm to the touch.  You  have pus or a bad smell coming from the bite area.  You have a fever. Get help right away if:  You have joint pain.  You have a rash.  You feel more tired or sleepy than you normally do.  You have neck pain.  You have a headache.  You feel weaker than you normally do.  You have signs of an anaphylactic reaction. Signs may include: ? Feeling warm in the face. ? Itchy, red, swollen areas of skin. ? Swelling of your:  Eyes.  Lips.  Face.  Mouth.  Tongue.  Throat. ? Trouble with any of  these:  Breathing.  Talking.  Swallowing. ? Loud breathing. ? Feeling dizzy or light-headed. ? Passing out. ? Pain or cramps in your belly. ? Throwing up. ? Watery poop. These symptoms may be an emergency. Do not wait to see if the symptoms will go away. Do this right away:  Use your auto-injector pen as you have been told.  Get medical help. Call your local emergency services (911 in the [place] Do not drive yourself to the hospital. Summary  An insect bite can make your skin red, itchy, and swollen.  Treatment is usually not needed. Symptoms often go away on their own.  Do not scratch the bite area. Keep it clean and dry.  Ice can help with pain and itching from the bite. This information is not intended to replace advice given to you by your health care provider. Make sure you discuss any questions you have with your health care provider. Document Revised: 10/27/2017 Document Reviewed: 10/27/2017 Elsevier Patient Education  [place].  Please be sure medication list is accurate. If a new problem present, please set up appointment sooner than planned today.

## 2019-12-21 ENCOUNTER — Encounter: Payer: Self-pay | Admitting: Family Medicine

## 2019-12-25 ENCOUNTER — Telehealth: Payer: Self-pay

## 2019-12-25 NOTE — Telephone Encounter (Signed)
I called and spoke with pt. Informed her that her insulin has arrived in office & she can pick it up when she is ready. Pt verbalized understanding.

## 2020-01-10 ENCOUNTER — Inpatient Hospital Stay: Payer: BC Managed Care – PPO | Attending: Hematology

## 2020-01-10 ENCOUNTER — Inpatient Hospital Stay (HOSPITAL_BASED_OUTPATIENT_CLINIC_OR_DEPARTMENT_OTHER): Payer: BC Managed Care – PPO | Admitting: Hematology

## 2020-01-10 ENCOUNTER — Other Ambulatory Visit: Payer: Self-pay

## 2020-01-10 VITALS — BP 167/73 | HR 62 | Temp 97.0°F | Resp 18 | Ht 64.0 in | Wt 203.0 lb

## 2020-01-10 DIAGNOSIS — C8300 Small cell B-cell lymphoma, unspecified site: Secondary | ICD-10-CM

## 2020-01-10 DIAGNOSIS — C8304 Small cell B-cell lymphoma, lymph nodes of axilla and upper limb: Secondary | ICD-10-CM | POA: Insufficient documentation

## 2020-01-10 DIAGNOSIS — N281 Cyst of kidney, acquired: Secondary | ICD-10-CM | POA: Insufficient documentation

## 2020-01-10 DIAGNOSIS — C9110 Chronic lymphocytic leukemia of B-cell type not having achieved remission: Secondary | ICD-10-CM | POA: Diagnosis not present

## 2020-01-10 LAB — CBC WITH DIFFERENTIAL/PLATELET
Abs Immature Granulocytes: 0.02 10*3/uL (ref 0.00–0.07)
Basophils Absolute: 0 10*3/uL (ref 0.0–0.1)
Basophils Relative: 1 %
Eosinophils Absolute: 0.2 10*3/uL (ref 0.0–0.5)
Eosinophils Relative: 4 %
HCT: 37.6 % (ref 36.0–46.0)
Hemoglobin: 12.1 g/dL (ref 12.0–15.0)
Immature Granulocytes: 0 %
Lymphocytes Relative: 49 %
Lymphs Abs: 2.7 10*3/uL (ref 0.7–4.0)
MCH: 25.5 pg — ABNORMAL LOW (ref 26.0–34.0)
MCHC: 32.2 g/dL (ref 30.0–36.0)
MCV: 79.2 fL — ABNORMAL LOW (ref 80.0–100.0)
Monocytes Absolute: 0.7 10*3/uL (ref 0.1–1.0)
Monocytes Relative: 12 %
Neutro Abs: 1.9 10*3/uL (ref 1.7–7.7)
Neutrophils Relative %: 34 %
Platelets: 225 10*3/uL (ref 150–400)
RBC: 4.75 MIL/uL (ref 3.87–5.11)
RDW: 15.3 % (ref 11.5–15.5)
WBC: 5.5 10*3/uL (ref 4.0–10.5)
nRBC: 0 % (ref 0.0–0.2)

## 2020-01-10 LAB — CMP (CANCER CENTER ONLY)
ALT: 21 U/L (ref 0–44)
AST: 20 U/L (ref 15–41)
Albumin: 3.1 g/dL — ABNORMAL LOW (ref 3.5–5.0)
Alkaline Phosphatase: 90 U/L (ref 38–126)
Anion gap: 5 (ref 5–15)
BUN: 16 mg/dL (ref 6–20)
CO2: 28 mmol/L (ref 22–32)
Calcium: 8.9 mg/dL (ref 8.9–10.3)
Chloride: 106 mmol/L (ref 98–111)
Creatinine: 0.81 mg/dL (ref 0.44–1.00)
GFR, Est AFR Am: >60 mL/min (ref >60)
GFR, Estimated: >60 mL/min (ref >60)
Glucose, Bld: 107 mg/dL — ABNORMAL HIGH (ref 70–99)
Potassium: 4 mmol/L (ref 3.5–5.1)
Sodium: 139 mmol/L (ref 135–145)
Total Bilirubin: 0.3 mg/dL (ref 0.3–1.2)
Total Protein: 7.3 g/dL (ref 6.5–8.1)

## 2020-01-10 LAB — LACTATE DEHYDROGENASE: LDH: 212 U/L — ABNORMAL HIGH (ref 98–192)

## 2020-01-10 NOTE — Progress Notes (Signed)
HEMATOLOGY/ONCOLOGY CONSULTATION NOTE  Date of Service: 01/10/2020  Patient Care Team: [name], [name], MD as PCP - General (Family Medicine)  CHIEF COMPLAINTS/PURPOSE OF CONSULTATION:  Newly diagnosed Non-Hodgkin's Lymphoma  HISTORY OF PRESENTING ILLNESS:   [name] is a wonderful 59 y.o. 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 The pt reports that she is doing well overall.   The pt reports that her axillary lymphadenopathy was picked up during a routing Mammogram on 05/31/2019. Pt denies feeling any lumps or bumps in either axillary regions. Pt has felt the same for the last 6-12 months.   She has HTN and Diabetes. Her HTN is currently well-controlled but her Diabetes has been harder to control. Pt notes that "sometimes it's up and sometimes it's down". 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 She feels that the reaction on her left side is a little slower than her right side, but nothing that's noticeable. She denies any weakness, vision changes, or other residual symptoms from either stroke. 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 Pt made a full recovery and has no remaining symptoms. Her eczema is seasonal and not very bothersome. She denies any concerns for frequent infections. Pt has neuropathy in her hands/fingers and has been prescribed Gabapentin, which is helping.   Pt does not drink much alcohol and has never been a smoker. Her father passed from [place] and had a history of smoking. Her mother passed from a stroke last spring. 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 She is living alone and has one sister in the area.   Pt has had her first dose of the COVID19 vaccine and tolerated it well. She has the second dose scheduled.   Of note prior to the patient's visit today, pt has had  Flow Pathology ([id number]) completed on 06/26/2019 with results revealing "Monoclonal B-cell population identified."  Pt has had Right Axilla LN Bx ([id number]) completed on 06/26/2019 with results revealing "NON-HODGKIN B-CELL LYMPHOMA"   Pt has also had Mammogram ([id number]) completed on 05/31/2019 with results revealing "Further evaluation is suggested for prominent lymph nodes in the bilateral axilla."  Most recent lab results (03/13/2019) of CBC and CMP is as follows: all values are WNL except for RBC at 5.61, MCV at 78.8, MCH at 25.8, Sodium at 131, Glucose at 502, BUN at 24.  On review of systems, pt reports hard stools, tingling/numbness in fingers/hands and denies weakness, vision changes, fevers, chills, night sweats, SOB, fatigue, constipation, diarrhea and any other symptoms.   On PMHx the pt reports Guillain-Barr syndrome, Type II Diabetes, Atopic Eczema, HLD, HTN, Stroke x2, Neuropathy. On Social Hx the pt reports that she does not drink much alcohol and is a non-smoker.  INTERVAL HISTORY:  [name] is a wonderful 59 y.o. female who is here for evaluation and management of Non-Hodgkin's Lymphoma. The patient's last visit with [place] was on 09/09/2019. The pt reports that she is doing well overall.  The pt reports that she feels well and denies any new concerns. She does experience non-drenching night sweats, but attributes this to low blood sugars.  Of note since the patient's last visit, pt has had CT C/A/P ([id number]) ([id number]) completed on 10/15/2019 with results revealing "1. Bilateral axillary, retroperitoneal, and pelvic adenopathy is identified as documented above. 2. Aortic  atherosclerosis. 3. Mildly complicated cyst arising from inferior pole of left kidney."  Of note since the patient's last visit, pt has had CT Soft Tissue Neck ([id number]) completed on 10/15/2019 with results revealing "1. Generalized bilateral cervical lymphadenopathy compatible with  Lymphoma/Leukemia."  Lab results today (01/10/20) of CBC w/diff and CMP is as follows: all values are WNL except for MCV at 79.2, MCH at 25.5, Glucose at 107, Albumin at 3.1. 01/10/2020 LDH at 212  On review of systems, pt denies fevers, chills, night sweats, unexpected weight loss, hematuria, discolored urine, bowel habit changes, dysuria and any other symptoms.    MEDICAL HISTORY:  Past Medical History:  Diagnosis Date  . Abnormality of gait 05/10/2010  . BACK PAIN 11/14/2008  . CEREBROVASCULAR DISEASE 08/05/2008  . DIABETES MELLITUS, TYPE II 07/15/2008  . Diplopia 07/15/2008  . ECZEMA, ATOPIC 04/03/2009  . Guillain-Barre ([place])   . HYPERLIPIDEMIA 03/06/2009  . HYPERTENSION 07/15/2008  . Stroke ([place]) 2010   x2   . Vertebral artery stenosis     SURGICAL HISTORY: Past Surgical History:  Procedure Laterality Date  . ABDOMINAL HYSTERECTOMY    . DILATION AND CURETTAGE OF UTERUS    . FOOT SURGERY      SOCIAL HISTORY: Social History   Socioeconomic History  . Marital status: Single    Spouse name: Not on file  . Number of children: Not on file  . Years of education: Not on file  . Highest education level: Not on file  Occupational History  . Not on file  Tobacco Use  . Smoking status: Never Smoker  . Smokeless tobacco: Never Used  Vaping Use  . Vaping Use: Never used  Substance and Sexual Activity  . Alcohol use: No  . Drug use: No  . Sexual activity: Not on file  Other Topics Concern  . Not on file  Social History Narrative  . Not on file   Social Determinants of Health   Financial Resource Strain:   . Difficulty of Paying Living Expenses: Not on file  Food Insecurity:   . Worried About Charity fundraiser in the Last Year: Not on file  . Ran Out of Food in the Last Year: Not on file  Transportation Needs:   . Lack of Transportation (Medical): Not on file  . Lack of Transportation (Non-Medical): Not on file  Physical Activity:   . Days of Exercise per Week: Not  on file  . Minutes of Exercise per Session: Not on file  Stress:   . Feeling of Stress : Not on file  Social Connections:   . Frequency of Communication with Friends and Family: Not on file  . Frequency of Social Gatherings with Friends and Family: Not on file  . Attends Religious Services: Not on file  . Active Member of Clubs or Organizations: Not on file  . Attends Archivist Meetings: Not on file  . Marital Status: Not on file  Intimate Partner Violence:   . Fear of Current or Ex-Partner: Not on file  . Emotionally Abused: Not on file  . Physically Abused: Not on file  . Sexually Abused: Not on file    FAMILY HISTORY: Family History  Problem Relation Age of Onset  . Diabetes Sister   . Asthma Other   . Stroke Other   . Hypertension Other   . Diabetes Mother   . Stroke Mother   . Cancer Father   . Breast cancer Neg Hx  ALLERGIES:  has No Known Allergies.  MEDICATIONS:  Current Outpatient Medications  Medication Sig Dispense Refill  . acetaminophen (TYLENOL) 500 MG tablet Take 2 tablets (1,000 mg total) by mouth every 6 (six) hours as needed. 30 tablet 0  . amLODipine (NORVASC) 10 MG tablet Take 1 tablet (10 mg total) by mouth daily. 90 tablet 3  . atorvastatin (LIPITOR) 40 MG tablet Take 1 tablet (40 mg total) by mouth daily. 90 tablet 3  . Blood Pressure Monitoring (BLOOD PRESSURE MONITOR AUTOMAT) DEVI 1 Device by Does not apply route daily. 1 Device 0  . clopidogrel (PLAVIX) 75 MG tablet Take 1 tablet (75 mg total) by mouth daily. 90 tablet 3  . famotidine (PEPCID) 20 MG tablet Take 1 tablet (20 mg total) by mouth 2 (two) times daily for 14 days. 28 tablet 0  . FLUoxetine (PROZAC) 20 MG capsule Take 1 capsule (20 mg total) by mouth daily. 90 capsule 2  . gabapentin (NEURONTIN) 300 MG capsule TAKE 1 CAPSULE BY MOUTH THREE TIMES DAILY (Patient taking differently: Take 300 mg by mouth daily. ) 90 capsule 4  . glucose blood (CONTOUR NEXT TEST) test strip 1  each by Other route 2 (two) times daily. And lancets 2/day 200 each 3  . glucose blood (ONETOUCH VERIO) test strip USE TO CHECK BLOOD SUGAR TWICE A DAY AND PRN 100 each 6  . insulin glargine (LANTUS) 100 UNIT/ML Solostar Pen Inject 120 Units into the skin every morning. And pen needles 1/day 105 mL 1  . insulin lispro (HUMALOG) 100 UNIT/ML injection 15 U 5-10 min before meals or with meals.Extra Humalog insulin if sugars is over 150 as follow:Add 1 unit of Humalog if your sugar is 150-200. Add 3 units of HumaLog if your sugar is 201-250.Add 5 units of Humalog if your sugar is 251-300 Add 7 units of Humalog if your sugar is over 301 (Patient taking differently: Inject 0-12 Units into the skin 3 (three) times daily with meals. 15 U 5-10 min before meals or with meals.Extra Humalog insulin if sugars is over 150 as follow:Add 1 unit of Humalog if your sugar is 150-200. Add 3 units of HumaLog if your sugar is 201-250.Add 5 units of Humalog if your sugar is 251-300 Add 7 units of Humalog if your sugar is over 301) 10 mL 3  . lisinopril-hydrochlorothiazide (ZESTORETIC) 20-12.5 MG tablet Take 2 tablets by mouth daily. (Patient taking differently: Take 1 tablet by mouth daily. ) 180 tablet 1  . meclizine (ANTIVERT) 25 MG tablet Take 1 tablet (25 mg total) by mouth 3 (three) times daily as needed for dizziness. 30 tablet 0  . omeprazole (PRILOSEC) 20 MG capsule Take 1 capsule (20 mg total) by mouth daily. 30 capsule 3  . omeprazole (PRILOSEC) 20 MG capsule Take 1 capsule by mouth once daily 90 capsule 2  . ONE TOUCH LANCETS MISC USE TO CHECK BLOOD SUGAR TWICE A DAY AND PRN 100 each 6  . triamcinolone ointment (KENALOG) 0.5 % Apply topically 2 (two) times daily as needed. 45 g 1   No current facility-administered medications for this visit.    REVIEW OF SYSTEMS:   A 10+ POINT REVIEW OF SYSTEMS WAS OBTAINED including neurology, dermatology, psychiatry, cardiac, respiratory, lymph, extremities, GI, GU,  Musculoskeletal, constitutional, breasts, reproductive, HEENT.  All pertinent positives are noted in the HPI.  All others are negative.   PHYSICAL EXAMINATION: ECOG PERFORMANCE STATUS: 0 - Asymptomatic  . Vitals:   01/10/20 1218  BP: [name])  167/73  Pulse: 62  Resp: 18  Temp: (!) 97 F (36.1 C)  SpO2: 100%   Filed Weights   01/10/20 1218  Weight: 203 lb (92.1 kg)   .Body mass index is 34.84 kg/m.   GENERAL:alert, in no acute distress and comfortable SKIN: no acute rashes, no significant lesions EYES: conjunctiva are pink and non-injected, sclera anicteric OROPHARYNX: MMM, no exudates, no oropharyngeal erythema or ulceration NECK: supple, no JVD LYMPH:  no palpable lymphadenopathy in the cervical, axillary or inguinal regions LUNGS: clear to auscultation b/l with normal respiratory effort HEART: regular rate & rhythm ABDOMEN:  normoactive bowel sounds , non tender, not distended. No palpable hepatosplenomegaly.  Extremity: no pedal edema PSYCH: alert & oriented x 3 with fluent speech NEURO: no focal motor/sensory deficits  LABORATORY DATA:  I have reviewed the data as listed  . CBC Latest Ref Rng & Units 01/10/2020 11/19/2019 10/15/2019  WBC 4.0 - 10.5 K/uL 5.5 6.2 5.6  Hemoglobin 12.0 - 15.0 g/dL 12.1 13.0 13.8  Hematocrit 36 - 46 % 37.6 40.4 41.9  Platelets 150 - 400 K/uL 225 258 232    . CMP Latest Ref Rng & Units 01/10/2020 11/19/2019 10/15/2019  Glucose 70 - 99 mg/dL 107(H) 94 404(H)  BUN 6 - 20 mg/dL 16 17 14   Creatinine 0.44 - 1.00 mg/dL 0.81 0.59 1.03(H)  Sodium 135 - 145 mmol/L 139 141 140  Potassium 3.5 - 5.1 mmol/L 4.0 3.8 5.0  Chloride 98 - 111 mmol/L 106 105 100  CO2 22 - 32 mmol/L 28 28 29   Calcium 8.9 - 10.3 mg/dL 8.9 9.9 10.5(H)  Total Protein 6.5 - 8.1 g/dL 7.3 - 8.2(H)  Total Bilirubin 0.3 - 1.2 mg/dL 0.3 - 0.4  Alkaline Phos 38 - 126 U/L 90 - 100  AST 15 - 41 U/L 20 - 15  ALT 0 - 44 U/L 21 - 21   07/16/2019 FISH-CLL Prognostic Panel:          RADIOGRAPHIC STUDIES: I have personally reviewed the radiological images as listed and agreed with the findings in the report. No results found.  ASSESSMENT & PLAN:    59 yo with   1) Recently Diagnosed CLL/SLL  Diagnosed incidentally as LNadenopathy noted on routine screening mamogram. No consitutional symptoms.  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 -Discussed 10/15/2019 CT C/A/P ([id number]) ([id number]) which revealed several small lymph nodes in axillary, retroperitoneal, and pelvic regions. Incidentally noted complex renal cyst.  -Discussed 06/15/2021CT Soft Tissue Neck ([id number]) which revealed bilateral cervical lymphadenopathy. -The pt shows no lab, clinical evidence, or radiographic of significant lymphoma progression at this time. -Would not move to treat at this time. Will continue watching with labs & clinic visits.  -Will space out f/u if labs and clinical exam stable on next appointment.  -Advised pt that renal cyst are relatively common, but an abdominal US could be ordered to evaluate further.  -Will refer pt to Urology for complicated renal cyst evaluation. -Will see back in 4 months with labs  FOLLOW UP: -Urology referral for complicated renal cyst -RTC with Dr Irene Limbo with labs in 4 months -okay to get covid booster if patient wants it.   The total time spent  in the appt was 30 minutes and more than 50% was on counseling and direct patient cares.  All of the patient's questions were answered with apparent satisfaction. The patient knows to call the clinic with any problems, questions or concerns.    Sullivan Lone MD [place] AAHIVMS [facility] Hematology/Oncology Physician [facility]  (Office):       [phone] (Work cell):  [phone] (Fax):           [phone]  01/10/2020 12:46 PM  I, Yevette Edwards, am acting as a scribe for Dr. Sullivan Lone.   .I have reviewed the above documentation for accuracy and completeness, and I agree with the above. Brunetta Genera MD

## 2020-01-15 ENCOUNTER — Ambulatory Visit: Payer: BC Managed Care – PPO | Admitting: Family Medicine

## 2020-01-16 ENCOUNTER — Other Ambulatory Visit: Payer: Self-pay | Admitting: Family Medicine

## 2020-02-04 ENCOUNTER — Ambulatory Visit: Payer: BC Managed Care – PPO | Admitting: Family Medicine

## 2020-02-04 ENCOUNTER — Encounter: Payer: Self-pay | Admitting: Family Medicine

## 2020-02-04 ENCOUNTER — Other Ambulatory Visit: Payer: Self-pay

## 2020-02-04 VITALS — BP 138/78 | HR 67 | Temp 98.5°F | Resp 16 | Ht 64.0 in | Wt 198.2 lb

## 2020-02-04 DIAGNOSIS — Z23 Encounter for immunization: Secondary | ICD-10-CM | POA: Diagnosis not present

## 2020-02-04 DIAGNOSIS — E1151 Type 2 diabetes mellitus with diabetic peripheral angiopathy without gangrene: Secondary | ICD-10-CM

## 2020-02-04 DIAGNOSIS — L989 Disorder of the skin and subcutaneous tissue, unspecified: Secondary | ICD-10-CM

## 2020-02-04 DIAGNOSIS — I10 Essential (primary) hypertension: Secondary | ICD-10-CM | POA: Diagnosis not present

## 2020-02-04 DIAGNOSIS — E1142 Type 2 diabetes mellitus with diabetic polyneuropathy: Secondary | ICD-10-CM | POA: Diagnosis not present

## 2020-02-04 LAB — POCT GLYCOSYLATED HEMOGLOBIN (HGB A1C): Hemoglobin A1C: 8.9 % — AB (ref 4.0–5.6)

## 2020-02-04 MED ORDER — MUPIROCIN 2 % EX OINT: 1.0000 "application " | TOPICAL_OINTMENT | Freq: Two times a day (BID) | CUTANEOUS | 0 refills | Status: AC

## 2020-02-04 MED ORDER — INSULIN LISPRO 100 UNIT/ML ~~LOC~~ SOLN: SUBCUTANEOUS | 3 refills | Status: DC

## 2020-02-04 NOTE — Patient Instructions (Addendum)
A few things to remember from today's visit:   DM (diabetes mellitus), type 2 with peripheral vascular complications ([place]) - Plan: POC HgB A1c, insulin lispro (HUMALOG) 100 UNIT/ML injection  Skin lesions  Essential hypertension  Diabetic peripheral neuropathy associated with type 2 diabetes mellitus ([place])  If you need refills please call your pharmacy. Do not use My Chart to request refills or for acute issues that need immediate attention. I do not think lesions behind ankles are infected but because diabetes healing is slow and high risk for infection. Keep lesions clean with soap and water.   Today Humalog increased from 15 U to 17 U before meals. No changes in Lantus. Continue following with endocrinologist.  No changes in Gabapentin.  Take 2 Lisinopril-hydrochlorothiazide 20-12.5 mg  in the morning ( 40-25 mg) morning. Amlodipine 10 mg at bedtime. Please be sure medication list is accurate. If a new problem present, please set up appointment sooner than planned today.

## 2020-02-04 NOTE — Progress Notes (Signed)
HPI: [name] is a 59 y.o. female, who is here today for follow up.   She was last seen on 12/18/19 for acute visit.  Since her last visit she has seen oncologist, small lymphocytic lymphoma. According to pt, medication has not been recommended for now.  HTN:She is on Lisinopril-HCTZ 20-12.5 mg 1 tab daily(she is supposed to be on 2 tabs daily) and Amlodipine 10 mg daily. Negative for severe/frequent headache, visual changes, chest pain, dyspnea, palpitation, focal weakness, or edema.  Home BP readings 130-140's/70's.  Lab Results  Component Value Date   CREATININE 0.81 01/10/2020   BUN 16 01/10/2020   NA 139 01/10/2020   K 4.0 01/10/2020   CL 106 01/10/2020   CO2 28 01/10/2020   Peripheral neuropathy:Numbness and tingling in LE's, feet pain, R>L. Gabapentin 300 mg tid is helping, some days feet feel better.  DM II: She is following with endocrinologist, she is not sure when she is supposed to follow. Last HgA1C on 10/09/19 was13.6. She is on Lantus 120 U daily, she is able to get medication for free through pt assistance program. Humalog 15 U tib before meals, she forgets medication 2 days per week. FG 200's, at night 2 hours after supper 150's. She has not noted hypoglycemic events.  Concerned about skin lesions LE that are not healed. Pruritic crusty lesions. There were erythematous, healing but slowly.? Insect bites. Negative for fever,chills,pain,or drainage from lesions. She has not used OTC medication.  Review of Systems  Constitutional: Positive for fatigue. Negative for activity change and appetite change.  HENT: Negative for mouth sores, nosebleeds and sore throat.   Respiratory: Negative for cough and wheezing.   Gastrointestinal: Negative for abdominal pain, nausea and vomiting.       Negative for changes in bowel habits.  Genitourinary: Negative for decreased urine volume, dysuria and hematuria.  Musculoskeletal: Negative for gait problem and  myalgias.  Neurological: Negative for syncope, facial asymmetry and weakness.  Rest of ROS, see pertinent positives sand negatives in HPI  Current Outpatient Medications on File Prior to Visit  Medication Sig Dispense Refill  . acetaminophen (TYLENOL) 500 MG tablet Take 2 tablets (1,000 mg total) by mouth every 6 (six) hours as needed. 30 tablet 0  . amLODipine (NORVASC) 10 MG tablet Take 1 tablet (10 mg total) by mouth daily. 90 tablet 3  . atorvastatin (LIPITOR) 40 MG tablet Take 1 tablet (40 mg total) by mouth daily. 90 tablet 3  . Blood Pressure Monitoring (BLOOD PRESSURE MONITOR AUTOMAT) DEVI 1 Device by Does not apply route daily. 1 Device 0  . clopidogrel (PLAVIX) 75 MG tablet Take 1 tablet (75 mg total) by mouth daily. 90 tablet 3  . FLUoxetine (PROZAC) 20 MG capsule Take 1 capsule (20 mg total) by mouth daily. 90 capsule 2  . gabapentin (NEURONTIN) 300 MG capsule Take 1 capsule (300 mg total) by mouth daily. 90 capsule 1  . glucose blood (CONTOUR NEXT TEST) test strip 1 each by Other route 2 (two) times daily. And lancets 2/day 200 each 3  . glucose blood (ONETOUCH VERIO) test strip USE TO CHECK BLOOD SUGAR TWICE A DAY AND PRN 100 each 6  . insulin glargine (LANTUS) 100 UNIT/ML Solostar Pen Inject 120 Units into the skin every morning. And pen needles 1/day 105 mL 1  . lisinopril-hydrochlorothiazide (ZESTORETIC) 20-12.5 MG tablet Take 2 tablets by mouth daily. (Patient taking differently: Take 1 tablet by mouth daily. ) 180 tablet 1  .  meclizine (ANTIVERT) 25 MG tablet Take 1 tablet (25 mg total) by mouth 3 (three) times daily as needed for dizziness. 30 tablet 0  . omeprazole (PRILOSEC) 20 MG capsule Take 1 capsule (20 mg total) by mouth daily. 30 capsule 3  . omeprazole (PRILOSEC) 20 MG capsule Take 1 capsule by mouth once daily 90 capsule 2  . ONE TOUCH LANCETS MISC USE TO CHECK BLOOD SUGAR TWICE A DAY AND PRN 100 each 6  . triamcinolone ointment (KENALOG) 0.5 % Apply topically 2  (two) times daily as needed. 45 g 1  . famotidine (PEPCID) 20 MG tablet Take 1 tablet (20 mg total) by mouth 2 (two) times daily for 14 days. 28 tablet 0   No current facility-administered medications on file prior to visit.   Past Medical History:  Diagnosis Date  . Abnormality of gait 05/10/2010  . BACK PAIN 11/14/2008  . CEREBROVASCULAR DISEASE 08/05/2008  . DIABETES MELLITUS, TYPE II 07/15/2008  . Diplopia 07/15/2008  . ECZEMA, ATOPIC 04/03/2009  . Guillain-Barre ([place])   . HYPERLIPIDEMIA 03/06/2009  . HYPERTENSION 07/15/2008  . Stroke ([place]) 2010   x2   . Vertebral artery stenosis    No Known Allergies  Social History   Socioeconomic History  . Marital status: Single    Spouse name: Not on file  . Number of children: Not on file  . Years of education: Not on file  . Highest education level: Not on file  Occupational History  . Not on file  Tobacco Use  . Smoking status: Never Smoker  . Smokeless tobacco: Never Used  Vaping Use  . Vaping Use: Never used  Substance and Sexual Activity  . Alcohol use: No  . Drug use: No  . Sexual activity: Not on file  Other Topics Concern  . Not on file  Social History Narrative  . Not on file   Social Determinants of Health   Financial Resource Strain:   . Difficulty of Paying Living Expenses: Not on file  Food Insecurity:   . Worried About Charity fundraiser in the Last Year: Not on file  . Ran Out of Food in the Last Year: Not on file  Transportation Needs:   . Lack of Transportation (Medical): Not on file  . Lack of Transportation (Non-Medical): Not on file  Physical Activity:   . Days of Exercise per Week: Not on file  . Minutes of Exercise per Session: Not on file  Stress:   . Feeling of Stress : Not on file  Social Connections:   . Frequency of Communication with Friends and Family: Not on file  . Frequency of Social Gatherings with Friends and Family: Not on file  . Attends Religious Services: Not on file  . Active  Member of Clubs or Organizations: Not on file  . Attends Archivist Meetings: Not on file  . Marital Status: Not on file   Vitals:   02/04/20 1506  BP: 138/78  Pulse: 67  Resp: 16  Temp: 98.5 F (36.9 C)  SpO2: 98%   Body mass index is 34.02 kg/m.  Physical Exam Vitals and nursing note reviewed.  Constitutional:      General: She is not in acute distress.    Appearance: She is well-developed.  HENT:     Head: Normocephalic and atraumatic.     Mouth/Throat:     Mouth: Mucous membranes are moist.     Pharynx: Oropharynx is clear.  Eyes:  Conjunctiva/sclera: Conjunctivae normal.     Pupils: Pupils are equal, round, and reactive to light.  Cardiovascular:     Rate and Rhythm: Normal rate and regular rhythm.     Pulses:          Dorsalis pedis pulses are 2+ on the right side and 2+ on the left side.     Heart sounds: No murmur heard.   Pulmonary:     Effort: Pulmonary effort is normal. No respiratory distress.     Breath sounds: Normal breath sounds.  Abdominal:     Palpations: Abdomen is soft. There is no hepatomegaly or mass.     Tenderness: There is no abdominal tenderness.  Lymphadenopathy:     Cervical: No cervical adenopathy.  Skin:    General: Skin is warm.     Comments: Crusty papular lesions behind ankle, bilateral. No erythematous or tender,about 6 mm.  Neurological:     Mental Status: She is alert and oriented to person, place, and time.     Cranial Nerves: No cranial nerve deficit.     Gait: Gait normal.  Psychiatric:     Comments: Well groomed, good eye contact.   ASSESSMENT AND PLAN:   [name] was seen today for follow-up.  Orders Placed This Encounter  Procedures  . Flu Vaccine QUAD 36+ mos IM  . POC HgB A1c   Lab Results  Component Value Date   HGBA1C 8.9 (A) 02/04/2020   Diabetic peripheral neuropathy associated with type 2 diabetes mellitus ([place]) Educated about appropriate foot care. Continue Gabapentin 300 mg  tid. Glucose must be better controlled.  Skin lesions Adequate skin care. No signs of active bacterial infection. Avoid scratching area. Daily moisturizer. Topical abx oint bid x 7 days.  -     mupirocin ointment (BACTROBAN) 2 %; Apply 1 application topically 2 (two) times daily for 14 days.  Essential hypertension I would like BP <=130/80, she is having some SBP 140's. Lisinopril-HCTZ 20-12.5 mg to take 2 daily. No changes in Amlodipine. Low salt diet. Continue monitoring BP at home.  DM (diabetes mellitus), type 2 with peripheral vascular complications ([place]) Problem still not well controlled but HgA1C has improved. Educated about the importance of compliance with med. Samples of Humalog given, dose increased from 15 U to 17 U before meals tid and add extra according to sliding scale. No changes in Lantus dose. She needs to arrange f/u appt with her endocrinologist.  -     insulin lispro (HUMALOG) 100 UNIT/ML injection; 17 U 5-10 min before meals or with meals.Extra Humalog insulin if sugars is over 150 as follow:Add 1 unit of Humalog if your sugar is 150-200. Add 3 units of HumaLog if your sugar is 201-250.Add 5 units of Humalog if your sugar is 251-300 Add 7 units of Humalog if your sugar is over 301  Need for influenza vaccination -     Flu Vaccine QUAD 36+ mos IM   Return in about 5 months (around 07/04/2020).   Kamden Stanislaw G. [name], MD  [facility]. [place] office.  A few things to remember from today's visit:   DM (diabetes mellitus), type 2 with peripheral vascular complications ([place]) - Plan: POC HgB A1c, insulin lispro (HUMALOG) 100 UNIT/ML injection  Skin lesions  Essential hypertension  Diabetic peripheral neuropathy associated with type 2 diabetes mellitus ([place])  If you need refills please call your pharmacy. Do not use My Chart to request refills or for acute issues that need immediate  attention. I do not think lesions behind ankles are infected  but because diabetes healing is slow and high risk for infection. Keep lesions clean with soap and water.   Today Humalog increased from 15 U to 17 U before meals. No changes in Lantus. Continue following with endocrinologist.  No changes in Gabapentin.  Take 2 Lisinopril-hydrochlorothiazide 20-12.5 mg  in the morning ( 40-25 mg) morning. Amlodipine 10 mg at bedtime. Please be sure medication list is accurate. If a new problem present, please set up appointment sooner than planned today.

## 2020-02-09 ENCOUNTER — Encounter: Payer: Self-pay | Admitting: Family Medicine

## 2020-03-20 ENCOUNTER — Encounter: Payer: Self-pay | Admitting: Family Medicine

## 2020-03-20 ENCOUNTER — Other Ambulatory Visit: Payer: Self-pay

## 2020-03-20 ENCOUNTER — Ambulatory Visit: Payer: BC Managed Care – PPO | Admitting: Family Medicine

## 2020-03-20 VITALS — BP 140/76 | HR 80 | Temp 97.5°F | Resp 16 | Ht 64.0 in | Wt 201.8 lb

## 2020-03-20 DIAGNOSIS — L84 Corns and callosities: Secondary | ICD-10-CM | POA: Diagnosis not present

## 2020-03-20 DIAGNOSIS — I10 Essential (primary) hypertension: Secondary | ICD-10-CM

## 2020-03-20 DIAGNOSIS — E1151 Type 2 diabetes mellitus with diabetic peripheral angiopathy without gangrene: Secondary | ICD-10-CM

## 2020-03-20 DIAGNOSIS — R0989 Other specified symptoms and signs involving the circulatory and respiratory systems: Secondary | ICD-10-CM | POA: Diagnosis not present

## 2020-03-20 DIAGNOSIS — R42 Dizziness and giddiness: Secondary | ICD-10-CM

## 2020-03-20 DIAGNOSIS — Z1211 Encounter for screening for malignant neoplasm of colon: Secondary | ICD-10-CM

## 2020-03-20 NOTE — Patient Instructions (Signed)
A few things to remember from today's visit:   Callus of foot  Essential hypertension  Dizziness  Bruit of left carotid artery - Plan: VAS US CAROTID  Dizziness has resolved, so I am not ordering labs today. Monitor blood pressure and blood sugar. Avoid skipping meals. Move slowly and fall precautions.  Arrange appointment with podiatrist for foot care. You can soak feet in warm water (check with hand) and Epson salt for 10 min then use a pomace stone gentle on calluses.  If you need refills please call your pharmacy. Do not use My Chart to request refills or for acute issues that need immediate attention.   Dizziness Dizziness is a common problem. It makes you feel unsteady or light-headed. You may feel like you are about to pass out (faint). Dizziness can lead to getting hurt if you stumble or fall. Dizziness can be caused by many things, including:  Medicines.  Not having enough water in your body (dehydration).  Illness. Follow these instructions at home: Eating and drinking   Drink enough fluid to keep your pee (urine) clear or pale yellow. This helps to keep you from getting dehydrated. Try to drink more clear fluids, such as water.  Do not drink alcohol.  Limit how much caffeine you drink or eat, if your doctor tells you to do that.  Limit how much salt (sodium) you drink or eat, if your doctor tells you to do that. Activity   Avoid making quick movements. ? When you stand up from sitting in a chair, steady yourself until you feel okay. ? In the morning, first sit up on the side of the bed. When you feel okay, stand slowly while you hold onto something. Do this until you know that your balance is fine.  If you need to stand in one place for a long time, move your legs often. Tighten and relax the muscles in your legs while you are standing.  Do not drive or use heavy machinery if you feel dizzy.  Avoid bending down if you feel dizzy. Place items in your home  so you can reach them easily without leaning over. Lifestyle  Do not use any products that contain nicotine or tobacco, such as cigarettes and e-cigarettes. If you need help quitting, ask your doctor.  Try to lower your stress level. You can do this by using methods such as yoga or meditation. Talk with your doctor if you need help. General instructions  Watch your dizziness for any changes.  Take over-the-counter and prescription medicines only as told by your doctor. Talk with your doctor if you think that you are dizzy because of a medicine that you are taking.  Tell a friend or a family member that you are feeling dizzy. If he or she notices any changes in your behavior, have this person call your doctor.  Keep all follow-up visits as told by your doctor. This is important. Contact a doctor if:  Your dizziness does not go away.  Your dizziness or light-headedness gets worse.  You feel sick to your stomach (nauseous).  You have trouble hearing.  You have new symptoms.  You are unsteady on your feet.  You feel like the room is spinning. Get help right away if:  You throw up (vomit) or have watery poop (diarrhea), and you cannot eat or drink anything.  You have trouble: ? Talking. ? Walking. ? Swallowing. ? Using your arms, hands, or legs.  You feel generally weak.  You  are not thinking clearly, or you have trouble forming sentences. A friend or family member may notice this.  You have: ? Chest pain. ? Pain in your belly (abdomen). ? Shortness of breath. ? Sweating.  Your vision changes.  You are bleeding.  You have a very bad headache.  You have neck pain or a stiff neck.  You have a fever. These symptoms may be an emergency. Do not wait to see if the symptoms will go away. Get medical help right away. Call your local emergency services (911 in the [place] Do not drive yourself to the hospital. Summary  Dizziness makes you feel unsteady or light-headed.  You may feel like you are about to pass out (faint).  Drink enough fluid to keep your pee (urine) clear or pale yellow. Do not drink alcohol.  Avoid making quick movements if you feel dizzy.  Watch your dizziness for any changes. This information is not intended to replace advice given to you by your health care provider. Make sure you discuss any questions you have with your health care provider. Document Revised: 04/21/2017 Document Reviewed: 05/05/2016 Elsevier Patient Education  [place].   Please be sure medication list is accurate. If a new problem present, please set up appointment sooner than planned today.

## 2020-03-20 NOTE — Progress Notes (Signed)
Chief Complaint  Patient presents with  . Dizziness   HPI: [name] is a 59 y.o. female with hx of DM II,HTN,CVD,and anxiety here today with above complaint. Reporting this as a new problem.  She feels like she is "out of balance." It does not happen when she is in bed.  Problem started 5 days ago. She has had 3 episodes, last one Tuesday, milder. It lasted a couple minutes. Denies associated headache,visual changes, sore throat,earache,hearing changes,ot tinnitus. Negative for focal neurologic deficit,nausea,or vomiting.  She has not identified exacerbating or alleviating factors.  She feels the dizziness" coming down" from forehead to nose.  Negative for recent URI or travel.  She has not checked BS during episodes. She has been more compliant with medications: Lantus 120 U daily and Humalog 17 U tid before meals . FG 90's and low 100's. Negative for polydipsia,polyuria, or polyphagia.  Lab Results  Component Value Date   HGBA1C 8.9 (A) 02/04/2020   She has not arranged appt with Dr Loanne Drilling.  She is trying to get enough fluids.  BP mildly elevated. She is on Lisinopril-HCTZ 20-12.5 mg daily and Amlodipine 10 mg daily.. Negative for CP,palpitations,or SOB. 2 weeks ago she noted some LE edema, which she thinks was caused by shoes she was wearing at that time. Negative for orthopnea and PND.  Lab Results  Component Value Date   CREATININE 0.81 01/10/2020   BUN 16 01/10/2020   NA 139 01/10/2020   K 4.0 01/10/2020   CL 106 01/10/2020   CO2 28 01/10/2020   Alleviated by LE elevation. LE edema has resolved.  Left plantar foot tender lesion, It hurts when walking. No hx of trauma. She has not tried OTC medications.  Review of Systems  Constitutional: Negative for activity change, appetite change and fever.  HENT: Negative for mouth sores and nosebleeds.   Respiratory: Negative for cough and wheezing.   Gastrointestinal: Negative for abdominal  pain.       Negative for changes in bowel habits.  Genitourinary: Negative for decreased urine volume, dysuria and hematuria.  Musculoskeletal: Negative for gait problem and myalgias.  Neurological: Negative for syncope and facial asymmetry.  Psychiatric/Behavioral: Negative for confusion.  Rest see pertinent positives and negatives per HPI.  Current Outpatient Medications on File Prior to Visit  Medication Sig Dispense Refill  . acetaminophen (TYLENOL) 500 MG tablet Take 2 tablets (1,000 mg total) by mouth every 6 (six) hours as needed. 30 tablet 0  . amLODipine (NORVASC) 10 MG tablet Take 1 tablet (10 mg total) by mouth daily. 90 tablet 3  . atorvastatin (LIPITOR) 40 MG tablet Take 1 tablet (40 mg total) by mouth daily. 90 tablet 3  . Blood Pressure Monitoring (BLOOD PRESSURE MONITOR AUTOMAT) DEVI 1 Device by Does not apply route daily. 1 Device 0  . clopidogrel (PLAVIX) 75 MG tablet Take 1 tablet (75 mg total) by mouth daily. 90 tablet 3  . famotidine (PEPCID) 20 MG tablet Take 1 tablet (20 mg total) by mouth 2 (two) times daily for 14 days. 28 tablet 0  . FLUoxetine (PROZAC) 20 MG capsule Take 1 capsule (20 mg total) by mouth daily. 90 capsule 2  . gabapentin (NEURONTIN) 300 MG capsule Take 1 capsule (300 mg total) by mouth daily. 90 capsule 1  . glucose blood (CONTOUR NEXT TEST) test strip 1 each by Other route 2 (two) times daily. And lancets 2/day 200 each 3  . glucose blood (ONETOUCH VERIO) test strip USE  TO CHECK BLOOD SUGAR TWICE A DAY AND PRN 100 each 6  . insulin glargine (LANTUS) 100 UNIT/ML Solostar Pen Inject 120 Units into the skin every morning. And pen needles 1/day 105 mL 1  . insulin lispro (HUMALOG) 100 UNIT/ML injection 17 U 5-10 min before meals or with meals.Extra Humalog insulin if sugars is over 150 as follow:Add 1 unit of Humalog if your sugar is 150-200. Add 3 units of HumaLog if your sugar is 201-250.Add 5 units of Humalog if your sugar is 251-300 Add 7 units of  Humalog if your sugar is over 301 10 mL 3  . lisinopril-hydrochlorothiazide (ZESTORETIC) 20-12.5 MG tablet Take 2 tablets by mouth daily. (Patient taking differently: Take 1 tablet by mouth daily. ) 180 tablet 1  . meclizine (ANTIVERT) 25 MG tablet Take 1 tablet (25 mg total) by mouth 3 (three) times daily as needed for dizziness. 30 tablet 0  . omeprazole (PRILOSEC) 20 MG capsule Take 1 capsule by mouth once daily 90 capsule 2  . ONE TOUCH LANCETS MISC USE TO CHECK BLOOD SUGAR TWICE A DAY AND PRN 100 each 6  . triamcinolone ointment (KENALOG) 0.5 % Apply topically 2 (two) times daily as needed. 45 g 1   No current facility-administered medications on file prior to visit.    Past Medical History:  Diagnosis Date  . Abnormality of gait 05/10/2010  . BACK PAIN 11/14/2008  . CEREBROVASCULAR DISEASE 08/05/2008  . DIABETES MELLITUS, TYPE II 07/15/2008  . Diplopia 07/15/2008  . ECZEMA, ATOPIC 04/03/2009  . Guillain-Barre ([place])   . HYPERLIPIDEMIA 03/06/2009  . HYPERTENSION 07/15/2008  . Stroke ([place]) 2010   x2   . Vertebral artery stenosis    No Known Allergies  Social History   Socioeconomic History  . Marital status: Single    Spouse name: Not on file  . Number of children: Not on file  . Years of education: Not on file  . Highest education level: Not on file  Occupational History  . Not on file  Tobacco Use  . Smoking status: Never Smoker  . Smokeless tobacco: Never Used  Vaping Use  . Vaping Use: Never used  Substance and Sexual Activity  . Alcohol use: No  . Drug use: No  . Sexual activity: Not on file  Other Topics Concern  . Not on file  Social History Narrative  . Not on file   Social Determinants of Health   Financial Resource Strain:   . Difficulty of Paying Living Expenses: Not on file  Food Insecurity:   . Worried About Charity fundraiser in the Last Year: Not on file  . Ran Out of Food in the Last Year: Not on file  Transportation Needs:   . Lack of  Transportation (Medical): Not on file  . Lack of Transportation (Non-Medical): Not on file  Physical Activity:   . Days of Exercise per Week: Not on file  . Minutes of Exercise per Session: Not on file  Stress:   . Feeling of Stress : Not on file  Social Connections:   . Frequency of Communication with Friends and Family: Not on file  . Frequency of Social Gatherings with Friends and Family: Not on file  . Attends Religious Services: Not on file  . Active Member of Clubs or Organizations: Not on file  . Attends Archivist Meetings: Not on file  . Marital Status: Not on file    Vitals:   03/20/20 1534  BP:  140/76  Pulse: 80  Resp: 16  Temp: (!) 97.5 F (36.4 C)  SpO2: 96%   Body mass index is 34.64 kg/m.  Physical Exam Vitals and nursing note reviewed.  Constitutional:      General: She is not in acute distress.    Appearance: She is well-developed.  HENT:     Head: Normocephalic and atraumatic.     Mouth/Throat:     Mouth: Mucous membranes are dry.     Pharynx: Oropharynx is clear.  Eyes:     Conjunctiva/sclera: Conjunctivae normal.     Pupils: Pupils are equal, round, and reactive to light.  Neck:     Vascular: Carotid bruit present.  Cardiovascular:     Rate and Rhythm: Normal rate and regular rhythm.     Heart sounds: No murmur heard.      Comments: DP pulse present, bilateral. Pulmonary:     Effort: Pulmonary effort is normal. No respiratory distress.     Breath sounds: Normal breath sounds.  Abdominal:     Palpations: Abdomen is soft. There is no hepatomegaly or mass.     Tenderness: There is no abdominal tenderness.  Musculoskeletal:       Feet:     Comments: Calluses scattered on plantar aspects of feet. No ulcers or erythema. No tenderness.  Lymphadenopathy:     Cervical: No cervical adenopathy.  Skin:    General: Skin is warm.     Findings: No erythema or rash.  Neurological:     Mental Status: She is alert and oriented to person,  place, and time.     Cranial Nerves: No cranial nerve deficit.     Gait: Gait normal.     Deep Tendon Reflexes:     Reflex Scores:      Bicep reflexes are 2+ on the right side and 2+ on the left side.      Patellar reflexes are 2+ on the right side and 2+ on the left side. Psychiatric:     Comments: Well groomed, good eye contact.    ASSESSMENT AND PLAN:  [name] was seen today for dizziness.  Diagnoses and all orders for this visit:  Bruit of left carotid artery Will arrange carotic [place]. Continue Atorvastatin and Plavix 75 mg daily.  -     VAS US CAROTID; Future  Callus of foot Appropriate foot care discussed. She soak feet in warm water and epsom salt for 10 min then gently scrubbing calluses with pomace stone.Hx of peripheral neuropathy so she needs to be careful with burns.   Dizziness Improved. We discussed possible etiologies. A few of her chronic problems and medications could be contributing factors. Fall precautions discussed. I do not think blood work is needed today. Instructed about warning signs.  Essential hypertension Mildly elevated. Instructed to monitor BP at home. Continue Amlodipine 10 mg daily and Lisinopril-HCTZ 20-12.5 mg daily. Low salt diet.  DM (diabetes mellitus), type 2 with peripheral vascular complications ([place]) Based on reported BS's problem seems to be better controlled. Instructed to arrange appt with endocrinologist.  Colon cancer screening -     Ambulatory referral to Gastroenterology   Spent 42 minutes.  During this time history was obtained and documented, examination was performed, prior labs reviewed, and assessment/plan discussed.  Return in about 3 months (around 06/20/2020).   Zarayah Lanting G. [name], MD  [facility]. [place] office.    A few things to remember from today's visit:   Callus of foot  Essential hypertension  Dizziness  Bruit of left carotid artery - Plan: VAS US CAROTID  Dizziness has  resolved, so I am not ordering labs today. Monitor blood pressure and blood sugar. Avoid skipping meals. Move slowly and fall precautions.  Arrange appointment with podiatrist for foot care. You can soak feet in warm water (check with hand) and Epson salt for 10 min then use a pomace stone gentle on calluses.  If you need refills please call your pharmacy. Do not use My Chart to request refills or for acute issues that need immediate attention.   Dizziness Dizziness is a common problem. It makes you feel unsteady or light-headed. You may feel like you are about to pass out (faint). Dizziness can lead to getting hurt if you stumble or fall. Dizziness can be caused by many things, including:  Medicines.  Not having enough water in your body (dehydration).  Illness. Follow these instructions at home: Eating and drinking   Drink enough fluid to keep your pee (urine) clear or pale yellow. This helps to keep you from getting dehydrated. Try to drink more clear fluids, such as water.  Do not drink alcohol.  Limit how much caffeine you drink or eat, if your doctor tells you to do that.  Limit how much salt (sodium) you drink or eat, if your doctor tells you to do that. Activity   Avoid making quick movements. ? When you stand up from sitting in a chair, steady yourself until you feel okay. ? In the morning, first sit up on the side of the bed. When you feel okay, stand slowly while you hold onto something. Do this until you know that your balance is fine.  If you need to stand in one place for a long time, move your legs often. Tighten and relax the muscles in your legs while you are standing.  Do not drive or use heavy machinery if you feel dizzy.  Avoid bending down if you feel dizzy. Place items in your home so you can reach them easily without leaning over. Lifestyle  Do not use any products that contain nicotine or tobacco, such as cigarettes and e-cigarettes. If you need  help quitting, ask your doctor.  Try to lower your stress level. You can do this by using methods such as yoga or meditation. Talk with your doctor if you need help. General instructions  Watch your dizziness for any changes.  Take over-the-counter and prescription medicines only as told by your doctor. Talk with your doctor if you think that you are dizzy because of a medicine that you are taking.  Tell a friend or a family member that you are feeling dizzy. If he or she notices any changes in your behavior, have this person call your doctor.  Keep all follow-up visits as told by your doctor. This is important. Contact a doctor if:  Your dizziness does not go away.  Your dizziness or light-headedness gets worse.  You feel sick to your stomach (nauseous).  You have trouble hearing.  You have new symptoms.  You are unsteady on your feet.  You feel like the room is spinning. Get help right away if:  You throw up (vomit) or have watery poop (diarrhea), and you cannot eat or drink anything.  You have trouble: ? Talking. ? Walking. ? Swallowing. ? Using your arms, hands, or legs.  You feel generally weak.  You are not thinking clearly, or you have trouble forming sentences. A friend or family member may  notice this.  You have: ? Chest pain. ? Pain in your belly (abdomen). ? Shortness of breath. ? Sweating.  Your vision changes.  You are bleeding.  You have a very bad headache.  You have neck pain or a stiff neck.  You have a fever. These symptoms may be an emergency. Do not wait to see if the symptoms will go away. Get medical help right away. Call your local emergency services (911 in the [place] Do not drive yourself to the hospital. Summary  Dizziness makes you feel unsteady or light-headed. You may feel like you are about to pass out (faint).  Drink enough fluid to keep your pee (urine) clear or pale yellow. Do not drink alcohol.  Avoid making quick  movements if you feel dizzy.  Watch your dizziness for any changes. This information is not intended to replace advice given to you by your health care provider. Make sure you discuss any questions you have with your health care provider. Document Revised: 04/21/2017 Document Reviewed: 05/05/2016 Elsevier Patient Education  [place].   Please be sure medication list is accurate. If a new problem present, please set up appointment sooner than planned today.

## 2020-04-01 ENCOUNTER — Other Ambulatory Visit: Payer: Self-pay | Admitting: Family Medicine

## 2020-04-01 DIAGNOSIS — W57XXXA Bitten or stung by nonvenomous insect and other nonvenomous arthropods, initial encounter: Secondary | ICD-10-CM

## 2020-04-01 DIAGNOSIS — L298 Other pruritus: Secondary | ICD-10-CM

## 2020-04-02 ENCOUNTER — Inpatient Hospital Stay (HOSPITAL_COMMUNITY): Admission: RE | Admit: 2020-04-02 | Payer: BC Managed Care – PPO | Source: Ambulatory Visit

## 2020-04-08 ENCOUNTER — Encounter (HOSPITAL_COMMUNITY): Payer: Self-pay

## 2020-04-08 ENCOUNTER — Emergency Department (HOSPITAL_COMMUNITY)
Admission: EM | Admit: 2020-04-08 | Discharge: 2020-04-09 | Disposition: A | Discharge status: Home / Self Care | Payer: BC Managed Care – PPO | Attending: Emergency Medicine | Admitting: Emergency Medicine

## 2020-04-08 ENCOUNTER — Other Ambulatory Visit: Payer: Self-pay

## 2020-04-08 DIAGNOSIS — E162 Hypoglycemia, unspecified: Secondary | ICD-10-CM | POA: Insufficient documentation

## 2020-04-08 DIAGNOSIS — Z794 Long term (current) use of insulin: Secondary | ICD-10-CM | POA: Insufficient documentation

## 2020-04-08 DIAGNOSIS — I10 Essential (primary) hypertension: Secondary | ICD-10-CM | POA: Insufficient documentation

## 2020-04-08 DIAGNOSIS — Z79899 Other long term (current) drug therapy: Secondary | ICD-10-CM | POA: Insufficient documentation

## 2020-04-08 DIAGNOSIS — Z7902 Long term (current) use of antithrombotics/antiplatelets: Secondary | ICD-10-CM | POA: Diagnosis not present

## 2020-04-08 DIAGNOSIS — E1169 Type 2 diabetes mellitus with other specified complication: Secondary | ICD-10-CM | POA: Diagnosis not present

## 2020-04-08 DIAGNOSIS — H538 Other visual disturbances: Secondary | ICD-10-CM | POA: Diagnosis present

## 2020-04-08 LAB — CBG MONITORING, ED: Glucose-Capillary: 56 mg/dL — ABNORMAL LOW (ref 70–99)

## 2020-04-08 LAB — I-STAT BETA HCG BLOOD, ED (MC, WL, AP ONLY): I-stat hCG, quantitative: <5 m[IU]/mL (ref <5)

## 2020-04-08 LAB — CBC
HCT: 41.1 % (ref 36.0–46.0)
Hemoglobin: 13.3 g/dL (ref 12.0–15.0)
MCH: 25.7 pg — ABNORMAL LOW (ref 26.0–34.0)
MCHC: 32.4 g/dL (ref 30.0–36.0)
MCV: 79.5 fL — ABNORMAL LOW (ref 80.0–100.0)
Platelets: 249 10*3/uL (ref 150–400)
RBC: 5.17 MIL/uL — ABNORMAL HIGH (ref 3.87–5.11)
RDW: 15.2 % (ref 11.5–15.5)
WBC: 6.4 10*3/uL (ref 4.0–10.5)
nRBC: 0 % (ref 0.0–0.2)

## 2020-04-08 LAB — BASIC METABOLIC PANEL WITH GFR
Anion gap: 7 (ref 5–15)
BUN: 25 mg/dL — ABNORMAL HIGH (ref 6–20)
CO2: 26 mmol/L (ref 22–32)
Calcium: 10.1 mg/dL (ref 8.9–10.3)
Chloride: 108 mmol/L (ref 98–111)
Creatinine, Ser: 0.99 mg/dL (ref 0.44–1.00)
GFR, Estimated: >60 mL/min
Glucose, Bld: 58 mg/dL — ABNORMAL LOW (ref 70–99)
Potassium: 3.7 mmol/L (ref 3.5–5.1)
Sodium: 141 mmol/L (ref 135–145)

## 2020-04-08 NOTE — ED Notes (Signed)
Writer of this note called and spoke to Dr. Alvino Chapel regarding pts symptoms and CVA and vertigo hx. Pt denies any symptoms at this time.

## 2020-04-08 NOTE — ED Triage Notes (Signed)
Pt reports today around 1430 she began feeling dizzy and reports experienced blurred vision in both eyes. Pt also states this same event occurred last Saturday. Pt reports that during the episode, she had a hard time keeping her balance. Pt denies N/V during the episode. Pt reports that the episode lasted about 30 min then it resolves on its on. Pt states today it occurred back to back which made her worry. Pt denies any weakness in her left or right side from baseline. Pt states she has a hx of CVA in 2010 & 2011. Pt is A&Ox4.

## 2020-04-08 NOTE — ED Notes (Signed)
Pt given orange juice and crackers and RN aware of blood sugar. Pt stated she had not eaten since this a.m.

## 2020-04-09 ENCOUNTER — Emergency Department (HOSPITAL_COMMUNITY): Payer: BC Managed Care – PPO

## 2020-04-09 LAB — URINALYSIS, ROUTINE W REFLEX MICROSCOPIC
Bilirubin Urine: NEGATIVE
Glucose, UA: NEGATIVE mg/dL
Ketones, ur: NEGATIVE mg/dL
Nitrite: NEGATIVE
Protein, ur: 100 mg/dL — AB
Specific Gravity, Urine: 1.014 (ref 1.005–1.030)
pH: 5 (ref 5.0–8.0)

## 2020-04-09 LAB — CBG MONITORING, ED
Glucose-Capillary: 191 mg/dL — ABNORMAL HIGH (ref 70–99)
Glucose-Capillary: 158 mg/dL — ABNORMAL HIGH (ref 70–99)
Glucose-Capillary: 98 mg/dL (ref 70–99)

## 2020-04-09 MED ORDER — SODIUM CHLORIDE 0.9% FLUSH: 3.0000 mL | INTRAVENOUS | Status: DC | PRN

## 2020-04-09 MED ORDER — SODIUM CHLORIDE 0.9% FLUSH
3.0000 mL | Freq: Two times a day (BID) | INTRAVENOUS | Status: DC
  Administered 2020-04-09: 3 mL via INTRAVENOUS

## 2020-04-09 MED ORDER — SODIUM CHLORIDE 0.9 % IV SOLN: 250.0000 mL | INTRAVENOUS | Status: DC | PRN

## 2020-04-09 NOTE — ED Provider Notes (Signed)
Patient is a 59 year old female whose care was transferred to me by [name].  Her HPI is below:  [name] is a 59 y.o. female with a history of CVA, diabetes mellitus type 2 with peripheral neuropathy, Guillain-Barr, HLD, HTN, vertebral artery stenosis, and small lymphocytic lymphoma, and HLD who presents to the emergency department with a chief complaint of blurred vision.  The patient reports that she has been having bilateral blurred vision constantly for the last 3 days.  She also reports that she has been having intermittent dizziness, lightheadedness, and feeling off balance intermittently for more than a month.    She was seen by her PCP for dizziness and feeling off balance with walking on November 19.  She reports that the most recent episode has been more severe than previous episodes.  She was scheduled for a carotid ultrasound earlier this week, but was unable to make the procedure.  She reports that she has a history of stroke in 2010 and symptoms associated with her stroke included bilateral diplopia.  She reports that this is also when she learned that she was a diabetic.  The history is provided by the patient and medical records. No language interpreter was used.   Physical Exam  BP (!) 184/73   Pulse 77   Temp 98.1 F (36.7 C) (Oral)   Resp 18   Ht 5\' 4"  (1.626 m)   Wt 90.7 kg   SpO2 99%   BMI 34.33 kg/m   Physical Exam Vitals and nursing note reviewed.  Constitutional:      General: She is not in acute distress.    Appearance: She is not ill-appearing or toxic-appearing.  HENT:     Head: Normocephalic.  Eyes:     Extraocular Movements: Extraocular movements intact.     Conjunctiva/sclera: Conjunctivae normal.     Pupils: Pupils are equal, round, and reactive to light.     Comments: No photophobia.  Patient is able to finger count bilaterally.  Extraocular movements are intact.  Pupils are equal round and reactive.  Cardiovascular:     Rate and  Rhythm: Normal rate and regular rhythm.     Heart sounds: No murmur heard. No friction rub. No gallop.   Pulmonary:     Effort: Pulmonary effort is normal. No respiratory distress.     Breath sounds: No stridor. No wheezing, rhonchi or rales.  Chest:     Chest wall: No tenderness.  Abdominal:     General: There is no distension.     Palpations: Abdomen is soft. There is no mass.     Tenderness: There is no abdominal tenderness. There is no right CVA tenderness, left CVA tenderness, guarding or rebound.     Hernia: No hernia is present.  Musculoskeletal:     Cervical back: Neck supple.  Skin:    General: Skin is warm.     Findings: No rash.  Neurological:     Mental Status: She is alert.     Comments: Cranial nerves II through XII are grossly intact.  No dysmetria with finger-to-nose bilaterally.  Sensation is intact and equal throughout.  Gait is not ataxic.  5 out of 5 strength against resistance of the bilateral lower extremities.  Psychiatric:        Behavior: Behavior normal.  ED Course/Procedures     Procedures  MDM  Patient is a 59 year old female who presents today due to blurry vision, unsteady gait, dizziness.  History of 2 prior CVAs.  Notes a history of diplopia with a prior CVA.  Patient's care was transferred to me at shift change from [facility].  CT scan of her head was reassuring and transfer of care the MRI of her brain is pending.  MRI of her brain is resulted with findings as noted below:  IMPRESSION:  1. No evidence of acute intracranial abnormality. Specifically, no  acute infarct.  2. Partially imaged cervical lymphadenopathy, better characterized  on CT neck from October 15, 2019.   Patient was initially found to be mildly hypoglycemic at 56 upon arrival.  She has a history of diabetes mellitus and takes insulin.  She states she has been taking Lantus but has actually not taken her mealtime insulin for the last couple of days.  She was given food and  her blood sugar improved with no recurrent episodes of hypoglycemia.  CBG just prior to discharge was 98.  Patient ambulated through the emergency department without difficulty.  Discussed her hyperglycemia and recommended that she follows up with her PCP as soon as possible to discuss this.  Recommended that she continue to monitor her blood glucose at home.  She verbalized understanding.  Patient states she feels comfortable being discharged at this time.  Feel that she is stable for discharge at this time.  Her questions were answered and she was amicable at the time of discharge.  Patient discharged to home/self care.  Condition at discharge: Stable  Note: Portions of this report may have been transcribed using voice recognition software. Every effort was made to ensure accuracy; however, inadvertent computerized transcription errors may be present.           Lisa Sexton, PA-C 04/09/20 6734    Lisa Pence, MD 04/09/20 [phone]

## 2020-04-09 NOTE — ED Provider Notes (Signed)
[facility] DEPT Provider Note   CSN: [id number] Arrival date & time: 04/08/20  1629     History Chief Complaint  Patient presents with  . Dizziness  . Blurred Vision    [name] is a 59 y.o. female with a history of CVA, diabetes mellitus type 2 with peripheral neuropathy, Guillain-Barr, HLD, HTN, vertebral artery stenosis, and small lymphocytic lymphoma, and HLD who presents to the emergency department with a chief complaint of blurred vision.  The patient reports that she has been having bilateral blurred vision constantly for the last 3 days.  She also reports that she has been having intermittent dizziness, lightheadedness, headache, and feeling off balance intermittently for more than a month.    She was seen by her PCP for dizziness and feeling off balance with walking on November 19.  She reports that the most recent episode has been more severe than previous episodes.  She was scheduled for a carotid ultrasound earlier this week, but was unable to make the procedure.  She reports that she has a history of stroke in 2010 and symptoms associated with her stroke included bilateral diplopia.  She reports that this is also when she learned that she was a diabetic.  She denies diplopia, amaurosis fugax, fever, chills, sore throat, cough, chest pain, shortness of breath, abdominal pain, nausea, vomiting, diarrhea.  The patient reports that she has been compliant with her home Lantus.  She is supposed to take 17 units of Humalog before meals and is also a sliding scale.  She reports that she has not been taking Humalog for the last few days.  She did take 120 units of Lantus earlier today.  She does also report that she ate a good breakfast and lunch earlier today.  No recent changes in her home medications.  No recent dietary changes.  The history is provided by the patient and medical records. No language interpreter was used.       Past  Medical History:  Diagnosis Date  . Abnormality of gait 05/10/2010  . BACK PAIN 11/14/2008  . CEREBROVASCULAR DISEASE 08/05/2008  . DIABETES MELLITUS, TYPE II 07/15/2008  . Diplopia 07/15/2008  . ECZEMA, ATOPIC 04/03/2009  . Guillain-Barre ([place])   . HYPERLIPIDEMIA 03/06/2009  . HYPERTENSION 07/15/2008  . Stroke ([place]) 2010   x2   . Vertebral artery stenosis     Patient Active Problem List   Diagnosis Date Noted  . Small lymphocytic lymphoma ([employer]) 10/08/2019  . Trigger finger, left ring finger 10/04/2018  . Alternating constipation and diarrhea 04/20/2018  . Diabetic peripheral neuropathy associated with type 2 diabetes mellitus ([place]) 02/26/2018  . DM (diabetes mellitus), type 2 with peripheral vascular complications ([place]) 59/16/3846  . ABNORMALITY OF GAIT 05/10/2010  . ECZEMA, ATOPIC 04/03/2009  . Hyperlipidemia associated with type 2 diabetes mellitus ([place]) 03/06/2009  . BACK PAIN 11/14/2008  . Cerebrovascular disease 08/05/2008  . DIPLOPIA 07/15/2008  . Essential hypertension 07/15/2008    Past Surgical History:  Procedure Laterality Date  . ABDOMINAL HYSTERECTOMY    . DILATION AND CURETTAGE OF UTERUS    . FOOT SURGERY       OB History   No obstetric history on file.     Family History  Problem Relation Age of Onset  . Diabetes Sister   . Asthma Other   . Stroke Other   . Hypertension Other   . Diabetes Mother   . Stroke Mother   . Cancer Father   .  Breast cancer Neg Hx     Social History   Tobacco Use  . Smoking status: Never Smoker  . Smokeless tobacco: Never Used  Vaping Use  . Vaping Use: Never used  Substance Use Topics  . Alcohol use: No  . Drug use: No    Home Medications Prior to Admission medications   Medication Sig Start Date End Date Taking? Authorizing Provider  amLODipine (NORVASC) 10 MG tablet Take 1 tablet (10 mg total) by mouth daily. 10/10/19  Yes [name], [name], MD  atorvastatin (LIPITOR) 40 MG tablet Take 1 tablet (40 mg total) by  mouth daily. 10/11/19  Yes [name], [name], MD  Blood Pressure Monitoring (BLOOD PRESSURE MONITOR AUTOMAT) DEVI 1 Device by Does not apply route daily. 08/29/18  Yes [name], [name], MD  clopidogrel (PLAVIX) 75 MG tablet Take 1 tablet (75 mg total) by mouth daily. 05/05/17  Yes [name], MD  famotidine (PEPCID) 20 MG tablet Take 1 tablet by mouth twice daily for 14 days 04/06/20  Yes [name], [name], MD  FLUoxetine (PROZAC) 20 MG capsule Take 1 capsule (20 mg total) by mouth daily. 09/04/19  Yes [name], [name], MD  gabapentin (NEURONTIN) 300 MG capsule Take 1 capsule (300 mg total) by mouth daily. 01/17/20  Yes [name], [name], MD  glucose blood (CONTOUR NEXT TEST) test strip 1 each by Other route 2 (two) times daily. And lancets 2/day 03/09/18  Yes [name], MD  glucose blood [facility] VERIO) test strip USE TO CHECK BLOOD SUGAR TWICE A DAY AND PRN 07/29/15  Yes [name], MD  insulin glargine (LANTUS) 100 UNIT/ML Solostar Pen Inject 120 Units into the skin every morning. And pen needles 1/day 12/06/19 05/29/20 Yes [name], [name], MD  insulin lispro (HUMALOG) 100 UNIT/ML injection 17 U 5-10 min before meals or with meals.Extra Humalog insulin if sugars is over 150 as follow:Add 1 unit of Humalog if your sugar is 150-200. Add 3 units of HumaLog if your sugar is 201-250.Add 5 units of Humalog if your sugar is 251-300 Add 7 units of Humalog if your sugar is over 301 02/04/20  Yes [name], [name], MD  lisinopril-hydrochlorothiazide (ZESTORETIC) 20-12.5 MG tablet Take 2 tablets by mouth daily. Patient taking differently: Take 1 tablet by mouth daily. 10/08/19  Yes [name], [name], MD  meclizine (ANTIVERT) 25 MG tablet Take 1 tablet (25 mg total) by mouth 3 (three) times daily as needed for dizziness. 11/19/19  Yes [name], MD  omeprazole (PRILOSEC) 20 MG capsule Take 1 capsule by mouth once daily 12/09/19  Yes [name], [name], MD  ONE TOUCH LANCETS MISC USE TO CHECK BLOOD SUGAR TWICE A  DAY AND PRN 07/29/15  Yes [name], MD  triamcinolone ointment (KENALOG) 0.5 % Apply topically 2 (two) times daily as needed. 10/23/19  Yes [name], [name], MD  acetaminophen (TYLENOL) 500 MG tablet Take 2 tablets (1,000 mg total) by mouth every 6 (six) hours as needed. 11/19/19   [name], MD    Allergies    Patient has no known allergies.  Review of Systems   Review of Systems  Constitutional: Negative for activity change, chills and fever.  HENT: Negative for sinus pressure, sinus pain and sore throat.   Eyes: Positive for visual disturbance. Negative for photophobia, pain and redness.  Respiratory: Negative for cough, shortness of breath and wheezing.   Cardiovascular: Negative for chest pain and palpitations.  Gastrointestinal: Negative for abdominal pain,  diarrhea, nausea and vomiting.  Genitourinary: Negative for dysuria.  Musculoskeletal: Negative for back pain.  Skin: Negative for rash.  Allergic/Immunologic: Negative for immunocompromised state.  Neurological: Positive for dizziness and light-headedness. Negative for syncope, weakness, numbness and headaches.  Psychiatric/Behavioral: Negative for confusion.    Physical Exam Updated Vital Signs BP (!) 184/73   Pulse 77   Temp 98.1 F (36.7 C) (Oral)   Resp 18   Ht 5\' 4"  (1.626 m)   Wt 90.7 kg   SpO2 99%   BMI 34.33 kg/m   Physical Exam Vitals and nursing note reviewed.  Constitutional:      General: She is not in acute distress.    Appearance: She is not ill-appearing or toxic-appearing.  HENT:     Head: Normocephalic.  Eyes:     Extraocular Movements: Extraocular movements intact.     Conjunctiva/sclera: Conjunctivae normal.     Pupils: Pupils are equal, round, and reactive to light.     Comments: No photophobia.  Patient is able to finger count bilaterally.  Extraocular movements are intact.  Pupils are equal round and reactive.  Cardiovascular:     Rate and Rhythm: Normal rate and regular  rhythm.     Heart sounds: No murmur heard. No friction rub. No gallop.   Pulmonary:     Effort: Pulmonary effort is normal. No respiratory distress.     Breath sounds: No stridor. No wheezing, rhonchi or rales.  Chest:     Chest wall: No tenderness.  Abdominal:     General: There is no distension.     Palpations: Abdomen is soft. There is no mass.     Tenderness: There is no abdominal tenderness. There is no right CVA tenderness, left CVA tenderness, guarding or rebound.     Hernia: No hernia is present.  Musculoskeletal:     Cervical back: Neck supple.  Skin:    General: Skin is warm.     Findings: No rash.  Neurological:     Mental Status: She is alert.     Comments: Cranial nerves II through XII are grossly intact.  No dysmetria with finger-to-nose bilaterally.  Sensation is intact and equal throughout.  Gait is not ataxic.  5 out of 5 strength against resistance of the bilateral lower extremities.  Psychiatric:        Behavior: Behavior normal.     ED Results / Procedures / Treatments   Labs (all labs ordered are listed, but only abnormal results are displayed) Labs Reviewed  BASIC METABOLIC PANEL - Abnormal; Notable for the following components:      Result Value   Glucose, Bld 58 (*)    BUN 25 (*)    All other components within normal limits  CBC - Abnormal; Notable for the following components:   RBC 5.17 (*)    MCV 79.5 (*)    MCH 25.7 (*)    All other components within normal limits  URINALYSIS, ROUTINE W REFLEX MICROSCOPIC - Abnormal; Notable for the following components:   Hgb urine dipstick SMALL (*)    Protein, ur 100 (*)    Leukocytes,Ua TRACE (*)    Bacteria, UA RARE (*)    All other components within normal limits  CBG MONITORING, ED - Abnormal; Notable for the following components:   Glucose-Capillary 56 (*)    All other components within normal limits  CBG MONITORING, ED - Abnormal; Notable for the following components:   Glucose-Capillary 191 (*)  All other components within normal limits  CBG MONITORING, ED - Abnormal; Notable for the following components:   Glucose-Capillary 158 (*)    All other components within normal limits  I-STAT BETA HCG BLOOD, ED ([facility], [facility], [facility] ONLY)  CBG MONITORING, ED  CBG MONITORING, ED  CBG MONITORING, ED    EKG None  Radiology CT Head Wo Contrast  Result Date: 04/09/2020 CLINICAL DATA:  Dizziness and blurred vision. EXAM: CT HEAD WITHOUT CONTRAST TECHNIQUE: Contiguous axial images were obtained from the base of the skull through the vertex without intravenous contrast. COMPARISON:  MR head, dated November 19, 2019 and plain brain CT, dated May 10, 2010 FINDINGS: Brain: No evidence of acute infarction, hemorrhage, hydrocephalus, extra-axial collection or mass lesion/mass effect. Vascular: No hyperdense vessel or unexpected calcification. Skull: Normal. Negative for fracture or focal lesion. Sinuses/Orbits: No acute finding. Other: None. IMPRESSION: No acute intracranial pathology. Electronically Signed   By: Virgina Norfolk M.D.   On: 04/09/2020 00:50   MR BRAIN WO CONTRAST  Result Date: 04/09/2020 CLINICAL DATA:  Neuro deficit, acute stroke suspected. Dizziness, weakness, bilateral pleura vision. EXAM: MRI HEAD WITHOUT CONTRAST TECHNIQUE: Multiplanar, multiecho pulse sequences of the brain and surrounding structures were obtained without intravenous contrast. COMPARISON:  CT head from the same day. MRI head from November 19, 2019. FINDINGS: Brain: No acute infarction, hemorrhage, hydrocephalus, extra-axial collection or mass lesion. Small focus of susceptibility artifact in the right thalamus, likely related to chronic microhemorrhage. Scattered T2/FLAIR hyperintensities within the white matter, likely related to chronic microvascular ischemic disease. Vascular: Major arterial flow voids are maintained at the skull base. Skull and upper cervical spine: No focal skeletal lesion. Sinuses/Orbits: Visualized sinuses  are clear.  Unremarkable orbits. Other: Partially imaged cervical lymphadenopathy, better characterized on CT neck from October 15, 2019. IMPRESSION: 1. No evidence of acute intracranial abnormality. Specifically, no acute infarct. 2. Partially imaged cervical lymphadenopathy, better characterized on CT neck from October 15, 2019. Electronically Signed   By: Margaretha Sheffield MD   On: 04/09/2020 07:30    Procedures Procedures (including critical care time)  Medications Ordered in ED Medications  sodium chloride flush (NS) 0.9 % injection 3 mL (3 mLs Intravenous Given 04/09/20 0224)  sodium chloride flush (NS) 0.9 % injection 3 mL (has no administration in time range)  0.9 %  sodium chloride infusion (has no administration in time range)    ED Course  I have reviewed the triage vital signs and the nursing notes.  Pertinent labs & imaging results that were available during my care of the patient were reviewed by me and considered in my medical decision making (see chart for details).    MDM Rules/Calculators/A&P                          59 year old female with a history of CVA, diabetes mellitus type 2 with peripheral neuropathy, Guillain-Barr, HLD, HTN, vertebral artery stenosis, and small lymphocytic lymphoma, and HLD who presents the emergency department with 3 days of persistent bilateral blurred vision.  She also reports that she has been having intermittent episodes of dizziness and feeling off balance for more than a month.  She had an episode earlier today that was more severe than previous episodes.  States that she has a history of stroke where she initially presented with diplopia in 2010.  Differential diagnosis includes CVA, complicated migraine, arterial stenosis, infectious etiology, hypoglycemia, hyperglycemia, allergic rhinitis, or BPPV.   Labs and  imaging have been reviewed and independently interpreted by me.  Glucose noted to be 58 on arrival to the ER.  Unfortunately, she was  waiting in the waiting room for several hours prior to my evaluation.  Repeat CBG was 56.  She was given juice and crackers and hypoglycemia resolved.  Repeat glucose was 191.  She did continue to endorse bilateral blurred vision, dizziness, and gait instability.  Patient has only been taking her home Lantus.  She has not been taking her home Humalog.  She does report that she had 2 meals earlier in the day prior to coming to the ER.  No recent medication changes.  Unclear etiology of hypoglycemia at this time.  However, this could be the source of her blurred vision.  Labs are otherwise reassuring.  She had no recurrent episodes of hypoglycemia.  Head CT is unremarkable.  Patient was discussed with Dr. Tyrone Nine, attending physician.  Given dizziness and off-balance with new visual changes, will plan for MRI brain.  She was scheduled to have a carotid ultrasound on December 2, but this was canceled by the patient.  Patient care transferred to [place] at the end of my shift to follow up on MRI Brain.  If there are new findings, she will require admission.  If not, she will need to be ambulated and have her glucose rechecked given that she did have hypoglycemia on arrival to the ER with no clear etiology patient presentation, ED course, and plan of care discussed with review of all pertinent labs and imaging. Please see his/her note for further details regarding further ED course and disposition.   Final Clinical Impression(s) / ED Diagnoses Final diagnoses:  Hypoglycemia    Rx / DC Orders ED Discharge Orders    None       Makalah Asberry A, PA-C 04/09/20 [place], [place], DO 04/09/20 2259

## 2020-04-09 NOTE — ED Notes (Signed)
Patient ambulated in hallway for 500 ft.  Steady gate Oxygen regulated from 95% to 97%

## 2020-04-09 NOTE — Discharge Instructions (Addendum)
Like we discussed, please follow-up with your primary care provider soon as possible to discuss your insulin regimen.  Continue to monitor your blood glucose at home and be sure that your blood glucose is in the normal range.  This is typically between 80 and 100.  If you develop new or worsening symptoms please return to the ER for reevaluation.  It was a pleasure to meet you.

## 2020-04-15 ENCOUNTER — Other Ambulatory Visit: Payer: Self-pay

## 2020-04-15 ENCOUNTER — Encounter: Payer: Self-pay | Admitting: Family Medicine

## 2020-04-15 ENCOUNTER — Ambulatory Visit: Payer: BC Managed Care – PPO | Admitting: Family Medicine

## 2020-04-15 VITALS — BP 158/80 | HR 71 | Temp 98.4°F | Resp 16 | Ht 64.0 in | Wt 201.2 lb

## 2020-04-15 DIAGNOSIS — R0600 Dyspnea, unspecified: Secondary | ICD-10-CM

## 2020-04-15 DIAGNOSIS — R0609 Other forms of dyspnea: Secondary | ICD-10-CM

## 2020-04-15 DIAGNOSIS — E1151 Type 2 diabetes mellitus with diabetic peripheral angiopathy without gangrene: Secondary | ICD-10-CM | POA: Diagnosis not present

## 2020-04-15 DIAGNOSIS — I10 Essential (primary) hypertension: Secondary | ICD-10-CM

## 2020-04-15 DIAGNOSIS — R42 Dizziness and giddiness: Secondary | ICD-10-CM

## 2020-04-15 DIAGNOSIS — R0981 Nasal congestion: Secondary | ICD-10-CM | POA: Diagnosis not present

## 2020-04-15 LAB — GLUCOSE, POCT (MANUAL RESULT ENTRY): POC Glucose: 88 mg/dl (ref 70–99)

## 2020-04-15 MED ORDER — INSULIN LISPRO 100 UNIT/ML ~~LOC~~ SOLN: SUBCUTANEOUS | 3 refills | Status: DC

## 2020-04-15 NOTE — Patient Instructions (Addendum)
A few things to remember from today's visit:   Dizziness  Essential hypertension  DM (diabetes mellitus), type 2 with peripheral vascular complications ([place]) - Plan: POC Glucose (CBG), insulin lispro (HUMALOG) 100 UNIT/ML injection  DOE (dyspnea on exertion) - Plan: Brain Natriuretic Peptide  If you need refills please call your pharmacy. Do not use My Chart to request refills or for acute issues that need immediate attention.   Lisinopril-hydrochlorothiazide 20-12.5 mg 2 tabs at the time. Avoid skipping meals. Humalog decreased from 17 to 13 U before meals. Appt with endocrinologist in 05/2020.  Flonase may help with nasal congestion.   Please be sure medication list is accurate. If a new problem present, please set up appointment sooner than planned today.

## 2020-04-15 NOTE — Progress Notes (Signed)
HPI: [name] is a 59 y.o. female, who is here today to follow on recent ER. Evaluated in the ER on 04/08/2020 because of 3 days of blurry vision and intermittent episodes of dizziness.  Diagnosed with hypoglycemia. She is on Lantus 120 units daily and Humalog 17 units 3 times daily before meals. BS's "good." BS's 80-150's Today 47,69 x2. She ate chips and pop tarts for lunch.  Lab Results  Component Value Date   HGBA1C 8.9 (A) 02/04/2020   Urgent care on 04/10/2020 because of dizziness. +Nasal congestion. Flonase nasal spray and meclizine were prescribed. She has not tried nasal spray because she was afraid of side effects.  Last episode of dizziness was yesterday after drinking Gatorade. She feels unsteady/off-balance. Episode last a few minutes. No associated symptoms. Pending carotid ultrasound.  HTN: She is checking BP at home 140-150/70-80's. BP was also elevated in the ER, 184/73.  Negative for severe/frequent headache, visual changes, chest pain, palpitation, claudication, focal weakness, or unusual edema. Currently she is on lisinopril-HCTZ 20-12.5 mg daily (she was supposed to be on 2 tablets daily) and amlodipine 10 mg daily.  Lab Results  Component Value Date   CREATININE 0.99 04/08/2020   BUN 25 (H) 04/08/2020   NA 141 04/08/2020   K 3.7 04/08/2020   CL 108 04/08/2020   CO2 26 04/08/2020   "Little"  SOB with long walks,intermittently,she cannot breath throat her nose. she cannot lie flat in bed because she feels like she cannot breath. She sleeping on 2 pillows, this has been going on for a while and stable. No PND.  Review of Systems  Constitutional: Positive for fatigue. Negative for activity change, appetite change and fever.  HENT: Negative for mouth sores, nosebleeds and sore throat.   Respiratory: Negative for cough and wheezing.   Gastrointestinal: Negative for abdominal pain, nausea and vomiting.       Negative for changes in  bowel habits.  Genitourinary: Negative for decreased urine volume, dysuria and hematuria.  Neurological: Negative for syncope and facial asymmetry.  Rest see pertinent positives and negatives per HPI.  Current Outpatient Medications on File Prior to Visit  Medication Sig Dispense Refill  . acetaminophen (TYLENOL) 500 MG tablet Take 2 tablets (1,000 mg total) by mouth every 6 (six) hours as needed. 30 tablet 0  . amLODipine (NORVASC) 10 MG tablet Take 1 tablet (10 mg total) by mouth daily. 90 tablet 3  . atorvastatin (LIPITOR) 40 MG tablet Take 1 tablet (40 mg total) by mouth daily. 90 tablet 3  . Blood Pressure Monitoring (BLOOD PRESSURE MONITOR AUTOMAT) DEVI 1 Device by Does not apply route daily. 1 Device 0  . clopidogrel (PLAVIX) 75 MG tablet Take 1 tablet (75 mg total) by mouth daily. 90 tablet 3  . famotidine (PEPCID) 20 MG tablet Take 1 tablet by mouth twice daily for 14 days 28 tablet 0  . FLUoxetine (PROZAC) 20 MG capsule Take 1 capsule (20 mg total) by mouth daily. 90 capsule 2  . gabapentin (NEURONTIN) 300 MG capsule Take 1 capsule (300 mg total) by mouth daily. 90 capsule 1  . glucose blood (CONTOUR NEXT TEST) test strip 1 each by Other route 2 (two) times daily. And lancets 2/day 200 each 3  . glucose blood (ONETOUCH VERIO) test strip USE TO CHECK BLOOD SUGAR TWICE A DAY AND PRN 100 each 6  . insulin glargine (LANTUS) 100 UNIT/ML Solostar Pen Inject 120 Units into the skin every morning. And  pen needles 1/day 105 mL 1  . lisinopril-hydrochlorothiazide (ZESTORETIC) 20-12.5 MG tablet Take 2 tablets by mouth daily. (Patient taking differently: Take 1 tablet by mouth daily.) 180 tablet 1  . omeprazole (PRILOSEC) 20 MG capsule Take 1 capsule by mouth once daily 90 capsule 2  . ONE TOUCH LANCETS MISC USE TO CHECK BLOOD SUGAR TWICE A DAY AND PRN 100 each 6  . triamcinolone ointment (KENALOG) 0.5 % Apply topically 2 (two) times daily as needed. 45 g 1   No current facility-administered  medications on file prior to visit.     Past Medical History:  Diagnosis Date  . Abnormality of gait 05/10/2010  . BACK PAIN 11/14/2008  . CEREBROVASCULAR DISEASE 08/05/2008  . DIABETES MELLITUS, TYPE II 07/15/2008  . Diplopia 07/15/2008  . ECZEMA, ATOPIC 04/03/2009  . Guillain-Barre ([place])   . HYPERLIPIDEMIA 03/06/2009  . HYPERTENSION 07/15/2008  . Stroke ([place]) 2010   x2   . Vertebral artery stenosis    No Known Allergies  Social History   Socioeconomic History  . Marital status: Single    Spouse name: Not on file  . Number of children: Not on file  . Years of education: Not on file  . Highest education level: Not on file  Occupational History  . Not on file  Tobacco Use  . Smoking status: Never Smoker  . Smokeless tobacco: Never Used  Vaping Use  . Vaping Use: Never used  Substance and Sexual Activity  . Alcohol use: No  . Drug use: No  . Sexual activity: Not on file  Other Topics Concern  . Not on file  Social History Narrative  . Not on file   Social Determinants of Health   Financial Resource Strain: Not on file  Food Insecurity: Not on file  Transportation Needs: Not on file  Physical Activity: Not on file  Stress: Not on file  Social Connections: Not on file    Vitals:   04/15/20 1212  BP: (!) 158/80  Pulse: 71  Resp: 16  Temp: 98.4 F (36.9 C)  SpO2: 96%   Body mass index is 34.54 kg/m.  Physical Exam Vitals and nursing note reviewed.  Constitutional:      General: She is not in acute distress.    Appearance: She is well-developed.  HENT:     Head: Normocephalic and atraumatic.     Nose: Septal deviation present.     Right Turbinates: Enlarged.     Left Turbinates: Enlarged.     Mouth/Throat:     Mouth: Oropharynx is clear and moist and mucous membranes are normal.  Eyes:     Conjunctiva/sclera: Conjunctivae normal.     Pupils: Pupils are equal, round, and reactive to light.  Cardiovascular:     Rate and Rhythm: Normal rate and regular  rhythm.     Pulses:          Dorsalis pedis pulses are 2+ on the right side and 2+ on the left side.     Heart sounds: No murmur heard.   Pulmonary:     Effort: Pulmonary effort is normal. No respiratory distress.     Breath sounds: Normal breath sounds.  Abdominal:     Palpations: Abdomen is soft. There is no hepatomegaly or mass.     Tenderness: There is no abdominal tenderness.  Musculoskeletal:     Right lower leg: 1+ Pitting Edema present.     Left lower leg: 1+ Pitting Edema present.  Lymphadenopathy:  Cervical: No cervical adenopathy.  Skin:    General: Skin is warm.     Findings: No erythema or rash.  Neurological:     Mental Status: She is alert and oriented to person, place, and time.     Cranial Nerves: No cranial nerve deficit.     Gait: Gait normal.     Deep Tendon Reflexes: Strength normal.  Psychiatric:        Mood and Affect: Mood and affect normal.     Comments: Well groomed, good eye contact.   ASSESSMENT AND PLAN:  Ms. Zuniga was here today for follow up.  Diagnoses and all orders for this visit:  Nasal congestion Recommend trying Flonase nasal spray. Nasal saline irrigation may also help.  Dizziness We discussed possible etiologies, history does not suggest vertigo. It seems to resolve. Pending carotid US tomorrow.  Essential hypertension Recheck: 116/90. BP is not adequately controlled. We discussed possible complications of elevated BP. Lisinopril-HCTZ to be increased from 20-12.5mg  to 40-25 mg daily. No changes in Amlodipine. Recommend monitoring BP regularly.  DM (diabetes mellitus), type 2 with peripheral vascular complications ([place]) Having hypoglycemic events. Recommend decreasing dose of Humalog from 17 to 13 units 3 times daily before meals, no changes in Lantus. We discussed the importance of following dietary recommendations. Stressed the importance of having a follow-up with her endocrinologist. Instructed about warning  signs.  -     insulin lispro (HUMALOG) 100 UNIT/ML injection; 13 U 5-10 min before meals or with meals.Extra Humalog insulin if sugars is over 150 as follow:Add 1 unit of Humalog if your sugar is 150-200. Add 3 units of HumaLog if your sugar is 201-250.Add 5 units of Humalog if your sugar is 251-300 Add 7 units of Humalog if your sugar is over 301  DOE (dyspnea on exertion) We discussed possible etiologies. ? Allergies. ? Orthopnea. EKG on 04/09/20: Voltage criteria for LVH. No signs of acute ischemia.  Clearly instructed about warning signs. Further recommendations according to BNP result.   Return in 4 weeks (on 05/13/2020), or HTN,SOB.  Horatio Bertz G. [name], MD  [facility]. [place] office.  A few things to remember from today's visit:   Dizziness  Essential hypertension  DM (diabetes mellitus), type 2 with peripheral vascular complications ([place]) - Plan: POC Glucose (CBG), insulin lispro (HUMALOG) 100 UNIT/ML injection  DOE (dyspnea on exertion) - Plan: Brain Natriuretic Peptide  If you need refills please call your pharmacy. Do not use My Chart to request refills or for acute issues that need immediate attention.   Lisinopril-hydrochlorothiazide 20-12.5 mg 2 tabs at the time. Avoid skipping meals. Humalog decreased from 17 to 13 U before meals. Appt with endocrinologist in 05/2020.  Flonase may help with nasal congestion.   Please be sure medication list is accurate. If a new problem present, please set up appointment sooner than planned today.

## 2020-04-16 ENCOUNTER — Ambulatory Visit (HOSPITAL_COMMUNITY)
Admission: RE | Admit: 2020-04-16 | Discharge: 2020-04-16 | Disposition: A | Discharge status: Home / Self Care | Payer: BC Managed Care – PPO | Source: Ambulatory Visit | Attending: Cardiology | Admitting: Cardiology

## 2020-04-16 DIAGNOSIS — R0989 Other specified symptoms and signs involving the circulatory and respiratory systems: Secondary | ICD-10-CM | POA: Diagnosis not present

## 2020-04-16 LAB — BRAIN NATRIURETIC PEPTIDE: Brain Natriuretic Peptide: 89 pg/mL (ref <100)

## 2020-04-21 NOTE — Progress Notes (Signed)
Patient scheduled for 05/22/2020 at 12 PM

## 2020-05-04 ENCOUNTER — Telehealth: Payer: Self-pay | Admitting: Hematology

## 2020-05-04 NOTE — Telephone Encounter (Signed)
Called patient regarding upcoming appointments, left a voicemail. 

## 2020-05-07 ENCOUNTER — Telehealth: Payer: Self-pay | Admitting: Hematology

## 2020-05-07 NOTE — Telephone Encounter (Signed)
Called patient regarding upcoming appointment, left a voicemail. 

## 2020-05-08 ENCOUNTER — Inpatient Hospital Stay: Payer: Self-pay | Attending: Hematology

## 2020-05-08 ENCOUNTER — Other Ambulatory Visit: Payer: Self-pay

## 2020-05-08 ENCOUNTER — Inpatient Hospital Stay (HOSPITAL_BASED_OUTPATIENT_CLINIC_OR_DEPARTMENT_OTHER): Payer: Self-pay | Admitting: Hematology

## 2020-05-08 VITALS — BP 147/72 | HR 62 | Temp 97.7°F | Resp 14 | Ht 64.0 in | Wt 200.2 lb

## 2020-05-08 DIAGNOSIS — C8514 Unspecified B-cell lymphoma, lymph nodes of axilla and upper limb: Secondary | ICD-10-CM | POA: Insufficient documentation

## 2020-05-08 DIAGNOSIS — C8300 Small cell B-cell lymphoma, unspecified site: Secondary | ICD-10-CM

## 2020-05-08 DIAGNOSIS — N281 Cyst of kidney, acquired: Secondary | ICD-10-CM

## 2020-05-08 LAB — CBC WITH DIFFERENTIAL/PLATELET
Abs Immature Granulocytes: 0 10*3/uL (ref 0.00–0.07)
Basophils Absolute: 0 10*3/uL (ref 0.0–0.1)
Basophils Relative: 1 %
Eosinophils Absolute: 0.1 10*3/uL (ref 0.0–0.5)
Eosinophils Relative: 1 %
HCT: 38.7 % (ref 36.0–46.0)
Hemoglobin: 12.6 g/dL (ref 12.0–15.0)
Immature Granulocytes: 0 %
Lymphocytes Relative: 44 %
Lymphs Abs: 2.4 10*3/uL (ref 0.7–4.0)
MCH: 25.4 pg — ABNORMAL LOW (ref 26.0–34.0)
MCHC: 32.6 g/dL (ref 30.0–36.0)
MCV: 78 fL — ABNORMAL LOW (ref 80.0–100.0)
Monocytes Absolute: 0.7 10*3/uL (ref 0.1–1.0)
Monocytes Relative: 12 %
Neutro Abs: 2.4 10*3/uL (ref 1.7–7.7)
Neutrophils Relative %: 42 %
Platelets: 246 10*3/uL (ref 150–400)
RBC: 4.96 MIL/uL (ref 3.87–5.11)
RDW: 14.8 % (ref 11.5–15.5)
WBC: 5.6 10*3/uL (ref 4.0–10.5)
nRBC: 0 % (ref 0.0–0.2)

## 2020-05-08 LAB — CMP (CANCER CENTER ONLY)
ALT: 20 U/L (ref 0–44)
AST: 17 U/L (ref 15–41)
Albumin: 3.3 g/dL — ABNORMAL LOW (ref 3.5–5.0)
Alkaline Phosphatase: 94 U/L (ref 38–126)
Anion gap: 6 (ref 5–15)
BUN: 18 mg/dL (ref 6–20)
CO2: 28 mmol/L (ref 22–32)
Calcium: 9.9 mg/dL (ref 8.9–10.3)
Chloride: 102 mmol/L (ref 98–111)
Creatinine: 0.96 mg/dL (ref 0.44–1.00)
GFR, Estimated: >60 mL/min (ref >60)
Glucose, Bld: 213 mg/dL — ABNORMAL HIGH (ref 70–99)
Potassium: 4.6 mmol/L (ref 3.5–5.1)
Sodium: 136 mmol/L (ref 135–145)
Total Bilirubin: 0.4 mg/dL (ref 0.3–1.2)
Total Protein: 7.7 g/dL (ref 6.5–8.1)

## 2020-05-08 LAB — LACTATE DEHYDROGENASE: LDH: 222 U/L — ABNORMAL HIGH (ref 98–192)

## 2020-05-08 NOTE — Progress Notes (Signed)
HEMATOLOGY/ONCOLOGY CONSULTATION NOTE  Date of Service: 05/08/2020  Patient Care Team: [place], [name], MD as PCP - General (Family Medicine)  CHIEF COMPLAINTS/PURPOSE OF CONSULTATION:  F/u for CLL/SLL  HISTORY OF PRESENTING ILLNESS:   [name] is a wonderful 60 y.o. 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 The pt reports that she is doing well overall.   The pt reports that her axillary lymphadenopathy was picked up during a routing Mammogram on 05/31/2019. Pt denies feeling any lumps or bumps in either axillary regions. Pt has felt the same for the last 6-12 months.   She has HTN and Diabetes. Her HTN is currently well-controlled but her Diabetes has been harder to control. Pt notes that "sometimes it's up and sometimes it's down". 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 She feels that the reaction on her left side is a little slower than her right side, but nothing that's noticeable. She denies any weakness, vision changes, or other residual symptoms from either stroke. 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 Pt made a full recovery and has no remaining symptoms. Her eczema is seasonal and not very bothersome. She denies any concerns for frequent infections. Pt has neuropathy in her hands/fingers and has been prescribed Gabapentin, which is helping.   Pt does not drink much alcohol and has never been a smoker. Her father passed from Lung Cancer and had a history of smoking. Her mother passed from a stroke last spring. 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 She is living alone and has one sister in the area.   Pt has had her first dose of the COVID19 vaccine and tolerated it well. She has the second dose scheduled.   Of note prior to the patient's visit today, pt has had Flow Pathology  ([id number]) completed on 06/26/2019 with results revealing "Monoclonal B-cell population identified."  Pt has had Right Axilla LN Bx ([id number]) completed on 06/26/2019 with results revealing "NON-HODGKIN B-CELL LYMPHOMA"   Pt has also had Mammogram [phone]) completed on 05/31/2019 with results revealing "Further evaluation is suggested for prominent lymph nodes in the bilateral axilla."  Most recent lab results (03/13/2019) of CBC and CMP is as follows: all values are WNL except for RBC at 5.61, MCV at 78.8, MCH at 25.8, Sodium at 131, Glucose at 502, BUN at 24.  On review of systems, pt reports hard stools, tingling/numbness in fingers/hands and denies weakness, vision changes, fevers, chills, night sweats, SOB, fatigue, constipation, diarrhea and any other symptoms.   On PMHx the pt reports Guillain-Barr syndrome, Type II Diabetes, Atopic Eczema, HLD, HTN, Stroke x2, Neuropathy. On Social Hx the pt reports that she does not drink much alcohol and is a non-smoker.  INTERVAL HISTORY:   [name] is a wonderful 60 y.o. female who is here for evaluation and management of Non-Hodgkin's Lymphoma. The patient's last visit with [place] was on 01/10/2020. The pt reports that she is doing well overall.  The pt reports that she has been feeling well and denies any new concerns. She notes no new medication changes and feels that her Diabetes has been stable. She has received her COVID19 vaccines and booster, as well as the annual flu vaccine. Pt received the Pneumovax in 2019.  Lab results today 05/08/2020 of CBC w/diff and CMP is  as follows: all values are WNL except for Glucose at 213, Albumin at 3.3, MCV at 78.0, MCH at 25.4. 05/08/2020 LDH at 222.  On review of systems, pt denies fevers, chills, night sweats, fatigue, unexpected weight loss, new lumps/bumps, abdominal pain, leg swelling and any other symptoms.   MEDICAL HISTORY:  Past Medical History:  Diagnosis Date  . Abnormality  of gait 05/10/2010  . BACK PAIN 11/14/2008  . CEREBROVASCULAR DISEASE 08/05/2008  . DIABETES MELLITUS, TYPE II 07/15/2008  . Diplopia 07/15/2008  . ECZEMA, ATOPIC 04/03/2009  . Guillain-Barre ([place])   . HYPERLIPIDEMIA 03/06/2009  . HYPERTENSION 07/15/2008  . Stroke ([place]) 2010   x2   . Vertebral artery stenosis     SURGICAL HISTORY: Past Surgical History:  Procedure Laterality Date  . ABDOMINAL HYSTERECTOMY    . DILATION AND CURETTAGE OF UTERUS    . FOOT SURGERY      SOCIAL HISTORY: Social History   Socioeconomic History  . Marital status: Single    Spouse name: Not on file  . Number of children: Not on file  . Years of education: Not on file  . Highest education level: Not on file  Occupational History  . Not on file  Tobacco Use  . Smoking status: Never Smoker  . Smokeless tobacco: Never Used  Vaping Use  . Vaping Use: Never used  Substance and Sexual Activity  . Alcohol use: No  . Drug use: No  . Sexual activity: Not on file  Other Topics Concern  . Not on file  Social History Narrative  . Not on file   Social Determinants of Health   Financial Resource Strain: Not on file  Food Insecurity: Not on file  Transportation Needs: Not on file  Physical Activity: Not on file  Stress: Not on file  Social Connections: Not on file  Intimate Partner Violence: Not on file    FAMILY HISTORY: Family History  Problem Relation Age of Onset  . Diabetes Sister   . Asthma Other   . Stroke Other   . Hypertension Other   . Diabetes Mother   . Stroke Mother   . Cancer Father   . Breast cancer Neg Hx     ALLERGIES:  has No Known Allergies.  MEDICATIONS:  Current Outpatient Medications  Medication Sig Dispense Refill  . acetaminophen (TYLENOL) 500 MG tablet Take 2 tablets (1,000 mg total) by mouth every 6 (six) hours as needed. 30 tablet 0  . amLODipine (NORVASC) 10 MG tablet Take 1 tablet (10 mg total) by mouth daily. 90 tablet 3  . atorvastatin (LIPITOR) 40 MG tablet  Take 1 tablet (40 mg total) by mouth daily. 90 tablet 3  . Blood Pressure Monitoring (BLOOD PRESSURE MONITOR AUTOMAT) DEVI 1 Device by Does not apply route daily. 1 Device 0  . clopidogrel (PLAVIX) 75 MG tablet Take 1 tablet (75 mg total) by mouth daily. 90 tablet 3  . famotidine (PEPCID) 20 MG tablet Take 1 tablet by mouth twice daily for 14 days 28 tablet 0  . FLUoxetine (PROZAC) 20 MG capsule Take 1 capsule (20 mg total) by mouth daily. 90 capsule 2  . gabapentin (NEURONTIN) 300 MG capsule Take 1 capsule (300 mg total) by mouth daily. 90 capsule 1  . glucose blood (CONTOUR NEXT TEST) test strip 1 each by Other route 2 (two) times daily. And lancets 2/day 200 each 3  . glucose blood (ONETOUCH VERIO) test strip USE TO CHECK BLOOD SUGAR TWICE A DAY  AND PRN 100 each 6  . insulin glargine (LANTUS) 100 UNIT/ML Solostar Pen Inject 120 Units into the skin every morning. And pen needles 1/day 105 mL 1  . insulin lispro (HUMALOG) 100 UNIT/ML injection 13 U 5-10 min before meals or with meals.Extra Humalog insulin if sugars is over 150 as follow:Add 1 unit of Humalog if your sugar is 150-200. Add 3 units of HumaLog if your sugar is 201-250.Add 5 units of Humalog if your sugar is 251-300 Add 7 units of Humalog if your sugar is over 301 10 mL 3  . lisinopril-hydrochlorothiazide (ZESTORETIC) 20-12.5 MG tablet Take 2 tablets by mouth daily. (Patient taking differently: Take 1 tablet by mouth daily.) 180 tablet 1  . omeprazole (PRILOSEC) 20 MG capsule Take 1 capsule by mouth once daily 90 capsule 2  . ONE TOUCH LANCETS MISC USE TO CHECK BLOOD SUGAR TWICE A DAY AND PRN 100 each 6  . triamcinolone ointment (KENALOG) 0.5 % Apply topically 2 (two) times daily as needed. 45 g 1   No current facility-administered medications for this visit.    REVIEW OF SYSTEMS:   A 10+ POINT REVIEW OF SYSTEMS WAS OBTAINED including neurology, dermatology, psychiatry, cardiac, respiratory, lymph, extremities, GI, GU,  Musculoskeletal, constitutional, breasts, reproductive, HEENT.  All pertinent positives are noted in the HPI.  All others are negative.   PHYSICAL EXAMINATION: ECOG PERFORMANCE STATUS: 0 - Asymptomatic  . Vitals:   05/08/20 1157  BP: (!) 147/72  Pulse: 62  Resp: 14  Temp: 97.7 F (36.5 C)  SpO2: 98%   Filed Weights   05/08/20 1157  Weight: 200 lb 3.2 oz (90.8 kg)   .Body mass index is 34.36 kg/m.   GENERAL:alert, in no acute distress and comfortable SKIN: no acute rashes, no significant lesions EYES: conjunctiva are pink and non-injected, sclera anicteric OROPHARYNX: MMM, no exudates, no oropharyngeal erythema or ulceration NECK: supple, no JVD LYMPH:  no palpable lymphadenopathy in the inguinal region. Small, palpable lymph nodes in cervical and axillary regions. LUNGS: clear to auscultation b/l with normal respiratory effort HEART: regular rate & rhythm ABDOMEN:  normoactive bowel sounds , non tender, not distended. No palpable hepatosplenomegaly.  Extremity: no pedal edema PSYCH: alert & oriented x 3 with fluent speech NEURO: no focal motor/sensory deficits  LABORATORY DATA:  I have reviewed the data as listed  . CBC Latest Ref Rng & Units 04/08/2020 01/10/2020 11/19/2019  WBC 4.0 - 10.5 K/uL 6.4 5.5 6.2  Hemoglobin 12.0 - 15.0 g/dL 13.3 12.1 13.0  Hematocrit 36.0 - 46.0 % 41.1 37.6 40.4  Platelets 150 - 400 K/uL 249 225 258    . CMP Latest Ref Rng & Units 04/08/2020 01/10/2020 11/19/2019  Glucose 70 - 99 mg/dL 58(L) 107(H) 94  BUN 6 - 20 mg/dL 25(H) 16 17  Creatinine 0.44 - 1.00 mg/dL 0.99 0.81 0.59  Sodium 135 - 145 mmol/L 141 139 141  Potassium 3.5 - 5.1 mmol/L 3.7 4.0 3.8  Chloride 98 - 111 mmol/L 108 106 105  CO2 22 - 32 mmol/L 26 28 28   Calcium 8.9 - 10.3 mg/dL 10.1 8.9 9.9  Total Protein 6.5 - 8.1 g/dL - 7.3 -  Total Bilirubin 0.3 - 1.2 mg/dL - 0.3 -  Alkaline Phos 38 - 126 U/L - 90 -  AST 15 - 41 U/L - 20 -  ALT 0 - 44 U/L - 21 -   07/16/2019 FISH-CLL  Prognostic Panel:         RADIOGRAPHIC STUDIES:  I have personally reviewed the radiological images as listed and agreed with the findings in the report. CT Head Wo Contrast  Result Date: 04/09/2020 CLINICAL DATA:  Dizziness and blurred vision. EXAM: CT HEAD WITHOUT CONTRAST TECHNIQUE: Contiguous axial images were obtained from the base of the skull through the vertex without intravenous contrast. COMPARISON:  MR head, dated November 19, 2019 and plain brain CT, dated May 10, 2010 FINDINGS: Brain: No evidence of acute infarction, hemorrhage, hydrocephalus, extra-axial collection or mass lesion/mass effect. Vascular: No hyperdense vessel or unexpected calcification. Skull: Normal. Negative for fracture or focal lesion. Sinuses/Orbits: No acute finding. Other: None. IMPRESSION: No acute intracranial pathology. Electronically Signed   By: Virgina Norfolk M.D.   On: 04/09/2020 00:50   MR BRAIN WO CONTRAST  Result Date: 04/09/2020 CLINICAL DATA:  Neuro deficit, acute stroke suspected. Dizziness, weakness, bilateral pleura vision. EXAM: MRI HEAD WITHOUT CONTRAST TECHNIQUE: Multiplanar, multiecho pulse sequences of the brain and surrounding structures were obtained without intravenous contrast. COMPARISON:  CT head from the same day. MRI head from November 19, 2019. FINDINGS: Brain: No acute infarction, hemorrhage, hydrocephalus, extra-axial collection or mass lesion. Small focus of susceptibility artifact in the right thalamus, likely related to chronic microhemorrhage. Scattered T2/FLAIR hyperintensities within the white matter, likely related to chronic microvascular ischemic disease. Vascular: Major arterial flow voids are maintained at the skull base. Skull and upper cervical spine: No focal skeletal lesion. Sinuses/Orbits: Visualized sinuses are clear.  Unremarkable orbits. Other: Partially imaged cervical lymphadenopathy, better characterized on CT neck from October 15, 2019. IMPRESSION: 1. No evidence of  acute intracranial abnormality. Specifically, no acute infarct. 2. Partially imaged cervical lymphadenopathy, better characterized on CT neck from October 15, 2019. Electronically Signed   By: Margaretha Sheffield MD   On: 04/09/2020 07:30   VAS US CAROTID  Result Date: 04/16/2020 Carotid Arterial Duplex Study Indications:  Left bruit and chronic intermittent dizziness. Risk Factors: Hypertension, hyperlipidemia, Diabetes, no history of smoking. Limitations   Today's exam was limited due to depth of vessels/high bifurcation. Performing Technologist: Mariane Masters RVT  Examination Guidelines: A complete evaluation includes B-mode imaging, spectral Doppler, color Doppler, and power Doppler as needed of all accessible portions of each vessel. Bilateral testing is considered an integral part of a complete examination. Limited examinations for reoccurring indications may be performed as noted.  Right Carotid Findings: +----------+--------+--------+--------+------------------+--------+           PSV cm/sEDV cm/sStenosisPlaque DescriptionComments +----------+--------+--------+--------+------------------+--------+ CCA Prox  80      14                                tortuous +----------+--------+--------+--------+------------------+--------+ CCA Distal58      15                                         +----------+--------+--------+--------+------------------+--------+ ICA Prox  160     27      1-39%   heterogenous               +----------+--------+--------+--------+------------------+--------+ ICA Mid   91      24                                         +----------+--------+--------+--------+------------------+--------+ ICA Distal56  19                                         +----------+--------+--------+--------+------------------+--------+ ECA       175     14                                         +----------+--------+--------+--------+------------------+--------+  +----------+--------+-------+----------------+-------------------+           PSV cm/sEDV cmsDescribe        Arm Pressure (mmHG) +----------+--------+-------+----------------+-------------------+ [id number]            Multiphasic, [id number]                 +----------+--------+-------+----------------+-------------------+ +---------+--------+--+--------+--+---------+ VertebralPSV cm/s29EDV cm/s11Antegrade +---------+--------+--+--------+--+---------+  Left Carotid Findings: +----------+--------+--------+--------+------------------+--------+           PSV cm/sEDV cm/sStenosisPlaque DescriptionComments +----------+--------+--------+--------+------------------+--------+ CCA Prox  69      11                                         +----------+--------+--------+--------+------------------+--------+ CCA Distal71      17                                         +----------+--------+--------+--------+------------------+--------+ ICA Prox  118     28      1-39%   smooth                     +----------+--------+--------+--------+------------------+--------+ ICA Mid   69      28                                         +----------+--------+--------+--------+------------------+--------+ ICA Distal75      30                                         +----------+--------+--------+--------+------------------+--------+ ECA       128     10                                         +----------+--------+--------+--------+------------------+--------+ +----------+--------+--------+----------------+-------------------+           PSV cm/sEDV cm/sDescribe        Arm Pressure (mmHG) +----------+--------+--------+----------------+-------------------+ [id number]             Multiphasic, WNL160                 +----------+--------+--------+----------------+-------------------+ +---------+--------+--+--------+--+---------+ VertebralPSV cm/s42EDV cm/s14Antegrade  +---------+--------+--+--------+--+---------+   Summary: Right Carotid: Velocities in the right ICA are consistent with a 1-39% stenosis. Left Carotid: Velocities in the left ICA are consistent with a 1-39% stenosis. Vertebrals:  Bilateral vertebral arteries demonstrate antegrade flow. Subclavians: Normal flow hemodynamics were seen in bilateral subclavian              arteries. *See table(s) above for measurements  and observations.  Electronically signed by [name] Rogue MD on 04/16/2020 at 9:36:51 PM.    Final     ASSESSMENT & PLAN:    60 yo with   1) Rai stage 1 CLL/SLL  Diagnosed incidentally as LNadenopathy noted on routine screening mamogram. No consitutional symptoms.  05/31/2019 Mammogram ([id number]) revealed "Further evaluation is suggested for prominent lymph nodes in the bilateral axilla." 06/26/2019 Flow Pathology ([id number]) revealed "Monoclonal B-cell population identified." 06/26/2019 Right Axilla LN Bx [id number]) revealed "NON-HODGKIN B-CELL LYMPHOMA"  07/16/2019 FISH/CLL Prognostic Panel ([id number]) revealed "Trisomy 12 (+12) is present."  10/15/2019 CT Soft Tissue Neck ([id number]) revealed bilateral cervical lymphadenopathy. 10/15/2019 CT C/A/P ([id number]) ([id number]) revealed several small lymph nodes in axillary, retroperitoneal, and pelvic regions. Incidentally noted complex renal cyst.   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 -No lab or clinical evidence of  Non-Hodgkin's Lymphoma progression necessitating treatment at this time. -Will refer to Urology for renal cyst. -Will see back in 4 months with labs.  FOLLOW UP: -Urology referral for complicated renal cyst -RTC with Dr Irene Limbo with labs in 4 months   The total time spent in the appt was 25 minutes and more than 50% was on  counseling and direct patient cares.  All of the patient's questions were answered with apparent satisfaction. The patient knows to call the clinic with any problems, questions or concerns.    Sullivan Lone MD [place] AAHIVMS [facility] Hematology/Oncology Physician [facility]  (Office):       [phone] (Work cell):  [phone] (Fax):           [phone]  05/08/2020 7:50 AM  I, Yevette Edwards, am acting as a scribe for Dr. Sullivan Lone.   .I have reviewed the above documentation for accuracy and completeness, and I agree with the above. Brunetta Genera MD

## 2020-05-22 ENCOUNTER — Ambulatory Visit: Payer: BC Managed Care – PPO | Admitting: Family Medicine

## 2020-06-16 ENCOUNTER — Encounter: Payer: Self-pay | Admitting: Gastroenterology

## 2020-06-24 LAB — HM DIABETES EYE EXAM

## 2020-06-26 ENCOUNTER — Encounter: Payer: Self-pay | Admitting: Family Medicine

## 2020-07-09 ENCOUNTER — Encounter: Payer: Self-pay | Admitting: Podiatry

## 2020-07-09 ENCOUNTER — Ambulatory Visit: Payer: BC Managed Care – PPO | Admitting: Podiatry

## 2020-07-09 ENCOUNTER — Other Ambulatory Visit: Payer: Self-pay

## 2020-07-09 DIAGNOSIS — M79675 Pain in left toe(s): Secondary | ICD-10-CM

## 2020-07-09 DIAGNOSIS — E1149 Type 2 diabetes mellitus with other diabetic neurological complication: Secondary | ICD-10-CM | POA: Diagnosis not present

## 2020-07-09 DIAGNOSIS — E1140 Type 2 diabetes mellitus with diabetic neuropathy, unspecified: Secondary | ICD-10-CM | POA: Diagnosis not present

## 2020-07-09 DIAGNOSIS — B351 Tinea unguium: Secondary | ICD-10-CM

## 2020-07-09 DIAGNOSIS — M79674 Pain in right toe(s): Secondary | ICD-10-CM

## 2020-07-10 ENCOUNTER — Ambulatory Visit: Payer: BC Managed Care – PPO | Admitting: Family Medicine

## 2020-07-10 ENCOUNTER — Encounter: Payer: Self-pay | Admitting: Family Medicine

## 2020-07-10 VITALS — BP 160/80 | HR 80 | Temp 98.5°F | Wt 193.0 lb

## 2020-07-10 DIAGNOSIS — F419 Anxiety disorder, unspecified: Secondary | ICD-10-CM | POA: Diagnosis not present

## 2020-07-10 DIAGNOSIS — I10 Essential (primary) hypertension: Secondary | ICD-10-CM

## 2020-07-10 MED ORDER — METOPROLOL SUCCINATE ER 50 MG PO TB24: 50.0000 mg | ORAL_TABLET | Freq: Every day | ORAL | 0 refills | Status: DC

## 2020-07-10 NOTE — Progress Notes (Signed)
   Subjective:    Patient ID: [name], female    DOB: Oct 25, 1960, 60 y.o.   MRN: [id number]  HPI Here because her Human Resources office advised her to leave work and see her doctor. She normally sees Dr. [name] for primary care. She has a hx of poorly controlled HTN and diabetes. 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 She has frequent conflicts with coworkers, and today she had to meet with the principal because several coworkers accused her of "sleeping on the job". She tearfully tells me that she works very hard every day but that the principal does not understand that. She felt that she could no longer work today so she came here. As we speak she denies any headache or chest pain or SOB or dizziness. She has FMLA paperwork and asks if she can be excused for a while from her job due to stress.    Review of Systems  Constitutional: Negative.   Respiratory: Negative.   Cardiovascular: Negative.   Neurological: Negative.   Psychiatric/Behavioral: Positive for decreased concentration and dysphoric mood. Negative for agitation, behavioral problems, confusion, hallucinations, self-injury, sleep disturbance and suicidal ideas. The patient is nervous/anxious.        Objective:   Physical Exam Constitutional:      Appearance: Normal appearance. She is not ill-appearing.  Cardiovascular:     Rate and Rhythm: Normal rate and regular rhythm.     Pulses: Normal pulses.     Heart sounds: Normal heart sounds.  Pulmonary:     Effort: Pulmonary effort is normal.     Breath sounds: Normal breath sounds.  Musculoskeletal:     Comments: Trace ankle bilateral edema   Neurological:     General: No focal deficit present.     Mental Status: She is alert and oriented to person, place, and time.  Psychiatric:        Thought Content: Thought content normal.        Judgment: Judgment normal.     Comments: Anxious  and tearful            Assessment & Plan:  I told her that we would excuse her from work today based on the poorly controlled HTN. We will add Metoprolol succinate 50 mg daily to her regimen. She will rest over the weekend. She will follow up with Dr. [name] on Monday 06-15-20 at 12:30. We will leave any discussion about any further leave of absence from work to her and Dr. [name]. Alysia Penna, MD

## 2020-07-11 NOTE — Progress Notes (Signed)
Subjective:   Patient ID: [name], female   DOB: 60 y.o.   MRN: [id number]   HPI Patient presents concerned about changes in color of her nailbeds and that they are very thick and she cannot cut them or take care of them and she is in relatively poor health   ROS      Objective:  Physical Exam  Neurovascular status intact with patient found to have significant nail disease 1-5 both feet that are thick and brittle and they are discolored bilateral     Assessment:  Chronic mycotic nail infection 1-5 both feet with discoloration associated with the thickness of the nails creating some probable low-grade dry blood formation under the nailbeds     Plan:  Reviewed condition and debrided mycotic nail infections today 1-5 with no iatrogenic bleeding and this can be done on a routine basis and explained to patient

## 2020-07-13 ENCOUNTER — Encounter: Payer: Self-pay | Admitting: Family Medicine

## 2020-07-13 ENCOUNTER — Other Ambulatory Visit: Payer: Self-pay

## 2020-07-13 ENCOUNTER — Ambulatory Visit: Payer: BC Managed Care – PPO | Admitting: Family Medicine

## 2020-07-13 VITALS — BP 130/80 | HR 58 | Resp 16 | Ht 64.0 in | Wt 193.0 lb

## 2020-07-13 DIAGNOSIS — I10 Essential (primary) hypertension: Secondary | ICD-10-CM

## 2020-07-13 DIAGNOSIS — E1142 Type 2 diabetes mellitus with diabetic polyneuropathy: Secondary | ICD-10-CM

## 2020-07-13 DIAGNOSIS — N281 Cyst of kidney, acquired: Secondary | ICD-10-CM

## 2020-07-13 DIAGNOSIS — R42 Dizziness and giddiness: Secondary | ICD-10-CM | POA: Diagnosis not present

## 2020-07-13 DIAGNOSIS — M2550 Pain in unspecified joint: Secondary | ICD-10-CM

## 2020-07-13 DIAGNOSIS — E1151 Type 2 diabetes mellitus with diabetic peripheral angiopathy without gangrene: Secondary | ICD-10-CM

## 2020-07-13 DIAGNOSIS — G4710 Hypersomnia, unspecified: Secondary | ICD-10-CM

## 2020-07-13 DIAGNOSIS — F419 Anxiety disorder, unspecified: Secondary | ICD-10-CM

## 2020-07-13 LAB — POCT GLYCOSYLATED HEMOGLOBIN (HGB A1C): HbA1c, POC (controlled diabetic range): 11.7 % — AB (ref 0.0–7.0)

## 2020-07-13 LAB — GLUCOSE, POCT (MANUAL RESULT ENTRY): POC Glucose: 365 mg/dl — AB (ref 70–99)

## 2020-07-13 MED ORDER — TRULICITY 0.75 MG/0.5ML ~~LOC~~ SOAJ: 0.7500 mg | SUBCUTANEOUS | 0 refills | Status: DC

## 2020-07-13 NOTE — Patient Instructions (Addendum)
A few things to remember from today's visit:   DM (diabetes mellitus), type 2 with peripheral vascular complications ([place]) - Plan: POC HgB A1c, POC Glucose (CBG), Dulaglutide (TRULICITY) 7.09 UK/3.8VK SOPN  Dizziness  Complex renal cyst  Essential hypertension  Hypersomnolence disorder - Plan: Ambulatory referral to Sleep Studies  If you need refills please call your pharmacy. Do not use My Chart to request refills or for acute issues that need immediate attention.   Please be sure medication list is accurate. If a new problem present, please set up appointment sooner than planned today.  [facility] phone number number is [phone] Please arrange appointment with Dr Loanne Drilling. Your diabetes is not well controlled, already affecting feet and eyes. Trulicity added today to take weekly.  I will complete FMLA for 2 weeks but I do not think more time will be approved, we can not support it.

## 2020-07-13 NOTE — Progress Notes (Signed)
HPI: [name] is a 60 y.o. female, who is here today to follow on recent OV.   She was seen on 07/10/2020 because elevated BP, Metoprolol Succinate 50 mg added. She is also on Lisinopril-HCTZ 20-12.5 mg 2 tabs daily and Amlodipine 10 mg daily. BP "up at times."  Negative for severe/frequent headache, visual changes, chest pain, dyspnea, palpitation, claudication, focal weakness, or edema. Lab Results  Component Value Date   CREATININE 0.96 05/08/2020   BUN 18 05/08/2020   NA 136 05/08/2020   K 4.6 05/08/2020   CL 102 05/08/2020   CO2 28 05/08/2020   Requesting FMLA completed until she can change to a different job. She is transferring to a different school, she is not sure when position will be opened for her. State that she has a "hard time getting up" to go to work, feels dizzy and fatigue. 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   Also having some issues with co-workers, apparently some reported her to the principle because she was falling asleep at work;which she denies.  She takes Prozac 20 mg for anxiety. Rx last filled in 08/2019 #90/2  According to pt, her employer recommended not to go back and to stay home until she can be transferred to a new school, "sometime"in April 2022.  -DM II: She has not arranged appt with her endocrinologist. She would like to have A1C check today.  Having some BS's in the 60's, feels better when drinking orange juice. Post prandial 200's.  She is on Lantus 120 U daily and Humalog 13 U before meals + extra according to BS's.   Lab Results  Component Value Date   HGBA1C 8.9 (A) 02/04/2020   Negative for polydipsia,polyuria, or polyphagia. Recently she had eye exam and was told she has "bleeding behind" her eye, not sure which one. Diabetic neuropathy: She is on Gabapentin 300 mg daily.  LE's numbness and tingling still present.  -She also mentioned that she needs to see a kidney  specialist to follow on kidney cyst seen on imaging. 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 Urology referral has been placed x 2. She states that she has not received information about appt.  -Bilateral upper chest soreness, elbow pain,and right groin discomfort. These have been going on for long time. "Sore all over." She has not noted joint edema or erythema.  -Fatigue, she does not feel rested in the morning. Wakes up a few times per night. She takes naps during the day. Not known OSA. She feels sleepy when driving, has not fallen asleep but if she sits in her car she falls asleep.  Review of Systems  Constitutional: Positive for activity change and fatigue. Negative for appetite change and fever.  HENT: Negative for mouth sores, nosebleeds and sore throat.   Respiratory: Negative for cough and wheezing.   Gastrointestinal: Negative for abdominal pain, nausea and vomiting.       Negative for changes in bowel habits.  Skin: Negative for rash and wound.  Neurological: Negative for syncope and facial asymmetry.  Psychiatric/Behavioral: Negative for confusion. The patient is nervous/anxious.   Rest see pertinent positives and negatives per HPI.  Current Outpatient Medications on File Prior to Visit  Medication Sig Dispense Refill  . acetaminophen (TYLENOL) 500 MG tablet Take 2 tablets (  1,000 mg total) by mouth every 6 (six) hours as needed. 30 tablet 0  . amLODipine (NORVASC) 10 MG tablet Take 1 tablet (10 mg total) by mouth daily. 90 tablet 3  . atorvastatin (LIPITOR) 40 MG tablet Take 1 tablet (40 mg total) by mouth daily. 90 tablet 3  . Blood Pressure Monitoring (BLOOD PRESSURE MONITOR AUTOMAT) DEVI 1 Device by Does not apply route daily. 1 Device 0  . clopidogrel (PLAVIX) 75 MG tablet Take 1 tablet (75 mg total) by  mouth daily. 90 tablet 3  . famotidine (PEPCID) 20 MG tablet Take 1 tablet by mouth twice daily for 14 days 28 tablet 0  . gabapentin (NEURONTIN) 300 MG capsule Take 1 capsule (300 mg total) by mouth daily. 90 capsule 1  . glucose blood (CONTOUR NEXT TEST) test strip 1 each by Other route 2 (two) times daily. And lancets 2/day 200 each 3  . glucose blood (ONETOUCH VERIO) test strip USE TO CHECK BLOOD SUGAR TWICE A DAY AND PRN 100 each 6  . insulin lispro (HUMALOG) 100 UNIT/ML injection 13 U 5-10 min before meals or with meals.Extra Humalog insulin if sugars is over 150 as follow:Add 1 unit of Humalog if your sugar is 150-200. Add 3 units of HumaLog if your sugar is 201-250.Add 5 units of Humalog if your sugar is 251-300 Add 7 units of Humalog if your sugar is over 301 10 mL 3  . lisinopril-hydrochlorothiazide (ZESTORETIC) 20-12.5 MG tablet Take 2 tablets by mouth daily. (Patient taking differently: Take 1 tablet by mouth daily.) 180 tablet 1  . metoprolol succinate (TOPROL-XL) 50 MG 24 hr tablet Take 1 tablet (50 mg total) by mouth daily. Take with or immediately following a meal. 30 tablet 0  . omeprazole (PRILOSEC) 20 MG capsule Take 1 capsule by mouth once daily 90 capsule 2  . ONE TOUCH LANCETS MISC USE TO CHECK BLOOD SUGAR TWICE A DAY AND PRN 100 each 6  . triamcinolone ointment (KENALOG) 0.5 % Apply topically 2 (two) times daily as needed. 45 g 1   No current facility-administered medications on file prior to visit.     Past Medical History:  Diagnosis Date  . Abnormality of gait 05/10/2010  . BACK PAIN 11/14/2008  . CEREBROVASCULAR DISEASE 08/05/2008  . DIABETES MELLITUS, TYPE II 07/15/2008  . Diplopia 07/15/2008  . ECZEMA, ATOPIC 04/03/2009  . Guillain-Barre ([place])   . HYPERLIPIDEMIA 03/06/2009  . HYPERTENSION 07/15/2008  . Stroke ([place]) 2010   x2   . Vertebral artery stenosis    No Known Allergies  Social History   Socioeconomic History  . Marital status: Single    Spouse name: Not  on file  . Number of children: Not on file  . Years of education: Not on file  . Highest education level: Not on file  Occupational History  . Not on file  Tobacco Use  . Smoking status: Never Smoker  . Smokeless tobacco: Never Used  Vaping Use  . Vaping Use: Never used  Substance and Sexual Activity  . Alcohol use: No  . Drug use: No  . Sexual activity: Not on file  Other Topics Concern  . Not on file  Social History Narrative  . Not on file   Social Determinants of Health   Financial Resource Strain: Not on file  Food Insecurity: Not on file  Transportation Needs: Not on file  Physical Activity: Not on file  Stress: Not on file  Social Connections: Not on file  Vitals:   07/13/20 1231  BP: 130/80  Pulse: (!) 58  Resp: 16  SpO2: 98%   Body mass index is 33.13 kg/m.  Physical Exam Vitals and nursing note reviewed.  Constitutional:      General: She is not in acute distress.    Appearance: She is well-developed.  HENT:     Head: Normocephalic and atraumatic.     Mouth/Throat:     Mouth: Mucous membranes are dry.  Eyes:     Conjunctiva/sclera: Conjunctivae normal.  Cardiovascular:     Rate and Rhythm: Regular rhythm. Bradycardia present.     Pulses:          Dorsalis pedis pulses are 2+ on the right side and 2+ on the left side.     Heart sounds: No murmur heard.   Pulmonary:     Effort: Pulmonary effort is normal. No respiratory distress.     Breath sounds: Normal breath sounds.  Abdominal:     Palpations: Abdomen is soft. There is no hepatomegaly or mass.     Tenderness: There is no abdominal tenderness.  Lymphadenopathy:     Cervical: No cervical adenopathy.  Skin:    General: Skin is warm.     Findings: No erythema or rash.  Neurological:     General: No focal deficit present.     Mental Status: She is alert and oriented to person, place, and time.     Cranial Nerves: No cranial nerve deficit.     Gait: Gait normal.  Psychiatric:         Mood and Affect: Mood is anxious.     Comments: Well groomed, good eye contact.   ASSESSMENT AND PLAN:  Ms. Mertice was seen today for follow-up.  Diagnoses and all orders for this visit: Orders Placed This Encounter  Procedures  . Ambulatory referral to Sleep Studies  . POC HgB A1c  . POC Glucose (CBG)    Lab Results  Component Value Date   HGBA1C 11.7 (A) 07/13/2020    Dizziness Poorly controlled diabetes,anxiety , and possible OSA can be contributing factors. Glucose today 365. Fall precautions discussed. Explained that I cannot complete FMLA for the time she is requesting. According to pt, her employer was who recommended going home until the new position was ready.  She agrees with 2 weeks, going back 07/27/20. FMLA completed and given to her to take to her employer. Stress the importance of compliance with taking her meds and keeping follow up appts.  Complex renal cyst 2 referrals have been placed by her oncologist. Contact information given, so she can call and arranged appt with urologist.  Hypersomnolence disorder We discussed possible etiologies, some of her chronic medical problems can be contributing factors. OSA also to be considered, sleep study will be arranged.  Polyarthralgia Problem seems to be chronic. No signs of synovitis. I do not think bood work is needed at this time. 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 Fibromyalgia to be considered.  Essential hypertension BP rechecked: 150/90. For now continue lisinopril-HCTZ 20-12.5 mg 2 tablets daily, amlodipine 10 mg daily, and metoprolol succinate 50 mg daily. Mild bradycardia today. Continue monitoring BP and HR at home. Low-salt diet. Follow-up in 4 weeks.  DM (diabetes mellitus), type 2 with peripheral vascular complications (HCC) Problem has been poorly controlled for some time. We discussed possible complications of poorly controlled glucose. Recommend arranging appointment with her  endocrinologist. Trulicity samples given today, and 0.75 mg weekly, we discussed some side effects. Continue Lantus  and Humalog dose. Caution with hypoglycemic events.  Diabetic peripheral neuropathy associated with type 2 diabetes mellitus ([place]) We discussed diagnosis, prognosis, and treatment options. Glucose control is main part treatment. Adequate foot care. Continue gabapentin 300 mg daily.   Anxiety disorder, unspecified type Not sure about compliance. For now no changes in Fluoxetine dose. CBT may help and recommended. She feels like transferring to a different school will help.  Spent 57 minutes.  During this time history was obtained and documented, examination was performed, prior labs/imaging reviewed, and assessment/plan discussed.  Return in about 4 weeks (around 08/10/2020) for HTN and dizziness..   G. [name], MD  [facility]. [place] office.   A few things to remember from today's visit:   DM (diabetes mellitus), type 2 with peripheral vascular complications ([place]) - Plan: POC HgB A1c, POC Glucose (CBG), Dulaglutide (TRULICITY) 2.83 TD/1.7OH SOPN  Dizziness  Complex renal cyst  Essential hypertension  Hypersomnolence disorder - Plan: Ambulatory referral to Sleep Studies  If you need refills please call your pharmacy. Do not use My Chart to request refills or for acute issues that need immediate attention.   Please be sure medication list is accurate. If a new problem present, please set up appointment sooner than planned today.  [facility] phone number number is [phone] Please arrange appointment with Dr Loanne Drilling. Your diabetes is not well controlled, already affecting feet and eyes. Trulicity added today to take weekly.  I will complete FMLA for 2 weeks but I do not think more time will be approved, we can not support it.

## 2020-07-13 NOTE — Assessment & Plan Note (Signed)
We discussed diagnosis, prognosis, and treatment options. Glucose control is main part treatment. Adequate foot care. Continue gabapentin 300 mg daily.

## 2020-07-13 NOTE — Assessment & Plan Note (Addendum)
BP rechecked: 150/90. For now continue lisinopril-HCTZ 20-12.5 mg 2 tablets daily, amlodipine 10 mg daily, and metoprolol succinate 50 mg daily. Mild bradycardia today. Continue monitoring BP and HR at home. Low-salt diet. Follow-up in 4 weeks.

## 2020-07-13 NOTE — Assessment & Plan Note (Signed)
Problem has been poorly controlled for some time. We discussed possible complications of poorly controlled glucose. Recommend arranging appointment with her endocrinologist. Trulicity samples given today, and 0.75 mg weekly, we discussed some side effects. Continue Lantus and Humalog dose. Caution with hypoglycemic events.

## 2020-07-14 ENCOUNTER — Telehealth: Payer: Self-pay | Admitting: Family Medicine

## 2020-07-14 NOTE — Telephone Encounter (Signed)
Forms filled out & faxed. Patient is aware.

## 2020-07-14 NOTE — Telephone Encounter (Signed)
Patient dropped off FMLA forms to have corrected and faxed to [phone] ATTN: Marijo Sanes  The patient would also like a call when completed and faxed at [phone].  Disposition: Dr's Folder

## 2020-07-16 ENCOUNTER — Telehealth: Payer: Self-pay | Admitting: Family Medicine

## 2020-07-16 NOTE — Telephone Encounter (Signed)
Patient is calling and is requesting a refill for insulin glargine (LANTUS) 100 UNIT/ML Solostar Pen     to be sent to [facility] at [phone] CB is [phone]

## 2020-07-17 MED ORDER — INSULIN GLARGINE 100 UNIT/ML SOLOSTAR PEN: 120.0000 [IU] | PEN_INJECTOR | SUBCUTANEOUS | 1 refills | Status: DC

## 2020-07-17 MED ORDER — FLUOXETINE HCL 20 MG PO CAPS: 20.0000 mg | ORAL_CAPSULE | Freq: Every day | ORAL | 2 refills | Status: DC

## 2020-07-17 NOTE — Telephone Encounter (Signed)
Rx has been faxed.

## 2020-07-24 ENCOUNTER — Telehealth: Payer: Self-pay

## 2020-07-24 NOTE — Telephone Encounter (Signed)
I called and spoke with pt. Let her know that her Lantus came in and she can come by the office to pick it up. Pt verbalized understanding.

## 2020-08-19 ENCOUNTER — Other Ambulatory Visit: Payer: Self-pay

## 2020-08-19 ENCOUNTER — Ambulatory Visit: Payer: BC Managed Care – PPO | Admitting: Family Medicine

## 2020-08-19 ENCOUNTER — Encounter: Payer: Self-pay | Admitting: Family Medicine

## 2020-08-19 VITALS — BP 150/90 | HR 86 | Temp 98.0°F | Resp 16 | Ht 64.0 in | Wt 201.0 lb

## 2020-08-19 DIAGNOSIS — R413 Other amnesia: Secondary | ICD-10-CM | POA: Diagnosis not present

## 2020-08-19 DIAGNOSIS — I10 Essential (primary) hypertension: Secondary | ICD-10-CM | POA: Diagnosis not present

## 2020-08-19 DIAGNOSIS — E1142 Type 2 diabetes mellitus with diabetic polyneuropathy: Secondary | ICD-10-CM

## 2020-08-19 DIAGNOSIS — E1151 Type 2 diabetes mellitus with diabetic peripheral angiopathy without gangrene: Secondary | ICD-10-CM

## 2020-08-19 DIAGNOSIS — R002 Palpitations: Secondary | ICD-10-CM | POA: Diagnosis not present

## 2020-08-19 DIAGNOSIS — R600 Localized edema: Secondary | ICD-10-CM

## 2020-08-19 DIAGNOSIS — R0789 Other chest pain: Secondary | ICD-10-CM

## 2020-08-19 MED ORDER — HYDRALAZINE HCL 25 MG PO TABS: 25.0000 mg | ORAL_TABLET | Freq: Three times a day (TID) | ORAL | 2 refills | Status: DC

## 2020-08-19 NOTE — Assessment & Plan Note (Addendum)
BP is not adequately controlled. Possible complications discussed. Hydralazine 25 mg tid added today. Continue same dosis of Metoprolol Succinate ,Amlodipine,and Lisinopril-HCTZ. Low salt diet to continue. Monitor BP regularly. F/U in 3 months.

## 2020-08-19 NOTE — Progress Notes (Addendum)
Chief Complaint  Patient presents with  . Memory Loss  . Night Sweats  . Leg Swelling  . Irregular Heart Beat  . Eye Problem   HPI:  [name] is a 60 y.o. female, who is here today with her sister for follow up and to address above concerns. She was last seen on 07/13/20, when she was concerned about elevated BP. Home BP's similar to today reading.  She is on Metoprolol succinate 50 mg daily,Amlodipine 10 mg daily,and Lisinopril-HCTZ 20-25 mg 2 tabs daily. She is taking medication as instructed. She has noted some blurry vision, intermittently for "a while." States that she cannot read without eye glasses. Negative for conjunctival erythema or eye drainage.  LE edema, seems worse this past weekend. No LE pain,edema,or erythema. It is not different in the morning or at night, constant.  Lab Results  Component Value Date   CREATININE 0.96 05/08/2020   BUN 18 05/08/2020   NA 136 05/08/2020   K 4.6 05/08/2020   CL 102 05/08/2020   CO2 28 05/08/2020   Today she is concerned about memory problems. Forgetting conversation details, states that sometimes co-workers call it to her attention, she thinks they are "joking." She is still able to manage her finances, paying her bills on time. Forgetting "small things", going to a room and forgetting what she was looking for, she has to go back and usually remembers a few minutes later. She has not gotten lost driving in town and has not problem with new addresses. Negative for headache or MS changes but she sometimes has a "weird feeling in my head." She states that former pcp was prescribing something for this problem.  CVD, she is on Plavix 75 mg daily and Atorvastatin 40 mg daily.  Anxiety on Prozac 20 mg daily.  Brain MRI in 04/2020: 1. No evidence of acute intracranial abnormality. Specifically, no acute infarct.  Lab Results  Component Value Date   TSH 1.14 09/11/2015   Lab Results  Component Value Date   WBC  5.6 05/08/2020   HGB 12.6 05/08/2020   HCT 38.7 05/08/2020   MCV 78.0 (L) 05/08/2020   PLT 246 05/08/2020   She cannot sleep flat in bed because her "sugar drops", feeling "clammy and jittery." So she is sleeping on a chair. She has not checked BS or BP during episodes. Wakes up with her HR feeling "different" "slow feeling,like it is going to start" and sometimes "fast" HR. It last a few seconds. It seems to be regular. No associated symptoms. Problem has been going on for 2 weeks.  Occasionally chest discomfort above breast, bilateral and not associated with above symptoms. It is not radiated. She has not identified exacerbating or alleviating factors. GERD on Omeprazole 20 mg daily. She has not noted heartburn.  She has not arranged appt with endocrinologist. Cannot afford Humalog. She is on Lantus 120 U daily. Trulicity started in 05/7508, 0.75 mg weekly.  Lab Results  Component Value Date   HGBA1C 11.7 (A) 07/13/2020   BS's <=200's. She takes Gabapentin for peripheral neuropathy. She is not sure if medication is helping and wonders if Gabapentin may be contributing to her fatigue and some of her above symptoms. Feels like her plantar area is "puffy" when she walks.  Non productive cough that started today. Initially she denies wheezing but her sister thinks she does; so she reports wheezing "sometimes" with moderate physical activity, mainly when she is "doing too much" a work. She denies associated  SOB. No hx of tobacco use or asthma.  Chest CT in 10/2019: 1. Bilateral axillary, retroperitoneal, and pelvic adenopathy is identified as documented above. 2. Aortic atherosclerosis. 3. Mildly complicated cyst arising from inferior pole of left kidney. Attention on follow-up imaging is advised.  She is following with urologist and oncologist. According to pt, cystoscopy will be arranged.  Colonoscopy scheduled for next month.  Review of Systems  Constitutional:  Positive for fatigue. Negative for activity change, appetite change and fever.  HENT: Negative for mouth sores, nosebleeds and sore throat.   Gastrointestinal: Negative for abdominal pain, nausea and vomiting.       Negative for changes in bowel habits.  Endocrine: Negative for cold intolerance and heat intolerance.  Genitourinary: Negative for decreased urine volume, dysuria and hematuria.  Neurological: Negative for syncope, facial asymmetry and weakness.  Psychiatric/Behavioral: Positive for sleep disturbance. Negative for confusion. The patient is nervous/anxious.   Rest of ROS, see pertinent positives sand negatives in HPI  Current Outpatient Medications on File Prior to Visit  Medication Sig Dispense Refill  . acetaminophen (TYLENOL) 500 MG tablet Take 2 tablets (1,000 mg total) by mouth every 6 (six) hours as needed. 30 tablet 0  . amLODipine (NORVASC) 10 MG tablet Take 1 tablet (10 mg total) by mouth daily. 90 tablet 3  . atorvastatin (LIPITOR) 40 MG tablet Take 1 tablet (40 mg total) by mouth daily. 90 tablet 3  . Blood Pressure Monitoring (BLOOD PRESSURE MONITOR AUTOMAT) DEVI 1 Device by Does not apply route daily. 1 Device 0  . clopidogrel (PLAVIX) 75 MG tablet Take 1 tablet (75 mg total) by mouth daily. 90 tablet 3  . Dulaglutide (TRULICITY) 8.14 GY/1.8HU SOPN Inject 0.75 mg into the skin once a week. 1 mL 0  . famotidine (PEPCID) 20 MG tablet Take 1 tablet by mouth twice daily for 14 days 28 tablet 0  . FLUoxetine (PROZAC) 20 MG capsule Take 1 capsule (20 mg total) by mouth daily. 90 capsule 2  . glucose blood (CONTOUR NEXT TEST) test strip 1 each by Other route 2 (two) times daily. And lancets 2/day 200 each 3  . glucose blood (ONETOUCH VERIO) test strip USE TO CHECK BLOOD SUGAR TWICE A DAY AND PRN 100 each 6  . insulin glargine (LANTUS) 100 UNIT/ML Solostar Pen Inject 120 Units into the skin every morning. And pen needles 1/day 105 mL 1  . insulin lispro (HUMALOG) 100 UNIT/ML  injection 13 U 5-10 min before meals or with meals.Extra Humalog insulin if sugars is over 150 as follow:Add 1 unit of Humalog if your sugar is 150-200. Add 3 units of HumaLog if your sugar is 201-250.Add 5 units of Humalog if your sugar is 251-300 Add 7 units of Humalog if your sugar is over 301 10 mL 3  . lisinopril-hydrochlorothiazide (ZESTORETIC) 20-12.5 MG tablet Take 2 tablets by mouth daily. (Patient taking differently: Take 1 tablet by mouth daily.) 180 tablet 1  . metoprolol succinate (TOPROL-XL) 50 MG 24 hr tablet Take 1 tablet (50 mg total) by mouth daily. Take with or immediately following a meal. 30 tablet 0  . omeprazole (PRILOSEC) 20 MG capsule Take 1 capsule by mouth once daily 90 capsule 2  . ONE TOUCH LANCETS MISC USE TO CHECK BLOOD SUGAR TWICE A DAY AND PRN 100 each 6  . triamcinolone ointment (KENALOG) 0.5 % Apply topically 2 (two) times daily as needed. 45 g 1   No current facility-administered medications on file prior to visit.  Past Medical History:  Diagnosis Date  . Abnormality of gait 05/10/2010  . BACK PAIN 11/14/2008  . CEREBROVASCULAR DISEASE 08/05/2008  . DIABETES MELLITUS, TYPE II 07/15/2008  . Diplopia 07/15/2008  . ECZEMA, ATOPIC 04/03/2009  . Guillain-Barre ([place])   . HYPERLIPIDEMIA 03/06/2009  . HYPERTENSION 07/15/2008  . Stroke ([place]) 2010   x2   . Vertebral artery stenosis    No Known Allergies  Social History   Socioeconomic History  . Marital status: Single    Spouse name: Not on file  . Number of children: Not on file  . Years of education: Not on file  . Highest education level: Not on file  Occupational History  . Not on file  Tobacco Use  . Smoking status: Never Smoker  . Smokeless tobacco: Never Used  Vaping Use  . Vaping Use: Never used  Substance and Sexual Activity  . Alcohol use: No  . Drug use: No  . Sexual activity: Not on file  Other Topics Concern  . Not on file  Social History Narrative  . Not on file   Social Determinants  of Health   Financial Resource Strain: Not on file  Food Insecurity: Not on file  Transportation Needs: Not on file  Physical Activity: Not on file  Stress: Not on file  Social Connections: Not on file    Vitals:   08/19/20 1440  BP: (!) 150/90  Pulse: 86  Resp: 16  Temp: 98 F (36.7 C)  SpO2: 96%   Body mass index is 34.5 kg/m.   Physical Exam Vitals and nursing note reviewed.  Constitutional:      General: She is not in acute distress.    Appearance: She is well-developed and well-groomed.  HENT:     Head: Normocephalic and atraumatic.     Mouth/Throat:     Mouth: Mucous membranes are moist.     Pharynx: Oropharynx is clear.  Eyes:     Conjunctiva/sclera: Conjunctivae normal.     Pupils: Pupils are equal, round, and reactive to light.  Cardiovascular:     Rate and Rhythm: Normal rate and regular rhythm.     Pulses:          Dorsalis pedis pulses are 2+ on the right side and 2+ on the left side.     Heart sounds: No murmur heard.   Pulmonary:     Effort: Pulmonary effort is normal. No respiratory distress.     Breath sounds: Normal breath sounds.  Abdominal:     Palpations: Abdomen is soft. There is no hepatomegaly or mass.     Tenderness: There is no abdominal tenderness.  Musculoskeletal:     Right lower leg: 1+ Pitting Edema present.     Left lower leg: 1+ Pitting Edema present.  Lymphadenopathy:     Cervical: No cervical adenopathy.  Skin:    General: Skin is warm.     Findings: No erythema or rash.  Neurological:     General: No focal deficit present.     Mental Status: She is alert and oriented to person, place, and time.     Cranial Nerves: No cranial nerve deficit.     Gait: Gait normal.     Deep Tendon Reflexes:     Reflex Scores:      Patellar reflexes are 2+ on the right side and 2+ on the left side. Psychiatric:        Mood and Affect: Mood is anxious.  ASSESSMENT AND PLAN:   [name] was seen today for  follow-up,memory loss, night sweats, leg swelling, irregular heart beat and eye problem.  Diagnoses and all orders for this visit: Orders Placed This Encounter  Procedures  . Basic metabolic panel  . TSH  . Brain Natriuretic Peptide  . Ambulatory referral to Neuropsychology  . Ambulatory referral to Cardiology  . EKG 12-Lead   Lab Results  Component Value Date   TSH 0.50 08/19/2020   Lab Results  Component Value Date   CREATININE 0.90 08/19/2020   BUN 27 (H) 08/19/2020   NA 138 08/19/2020   K 4.1 08/19/2020   CL 104 08/19/2020   CO2 28 08/19/2020   Palpitations Cardiology referral placed. Clearly instructed about warning signs. Mild bradycardia on EKG, for now no changes in Metoprolol. Instructed to monitor HR at home.  Memory difficulties Neurologic exam today does not suggest a serious process. We discussed possible etiologies. Neuropsychologic evaluation will be arranged.  Bilateral lower extremity edema We discussed possible etiologies. Compression stocking and LE elevation may help. Continue low salt diet.  Diabetic peripheral neuropathy associated with type 2 diabetes mellitus ([place]) Continue adequate foot care. Glucose needs to be better controlled. Gabapentin does not seem to be helping, so she was instructed to wean medication off.  Chest discomfort She has several risk factors for CVD. She is not having symptoms at this time. Possible causes discussed, ? Musculoskeletal,GERD among some. EKG today: SR,normal axis,? LAE,LVH voltage criteria,no T-ST changes. Compared with EKG 04/09/20 with no significant changes. Cardio referral placed.  Essential hypertension BP is not adequately controlled. Possible complications discussed. Hydralazine 25 mg tid added today. Continue same dosis of Metoprolol Succinate ,Amlodipine,and Lisinopril-HCTZ. Low salt diet to continue. Monitor BP regularly. F/U in 3 months.  DM (diabetes mellitus), type 2 with peripheral  vascular complications ([facility]) Problem has been poorly controlled. Sample of Humalog given. No changes in Lantus dose. Stressed the importance of following with her endocrinologist.   Spent 57 minutes.  During this time history was obtained and documented, examination was performed, prior labs/imaging reviewed, and assessment/plan discussed.  Return in about 3 months (around 11/18/2020).  Connie Lasater G. [name], MD  [facility]. [place] office.  A few things to remember from today's visit:   Palpitations - Plan: EKG 16-SAYT, Basic metabolic panel, TSH  Memory difficulties - Plan: Ambulatory referral to Neuropsychology, TSH  Bilateral lower extremity edema - Plan: Brain Natriuretic Peptide, Basic metabolic panel  Essential hypertension  If you need refills please call your pharmacy. Do not use My Chart to request refills or for acute issues that need immediate attention.   Start weaning off Gabapentin 300 mg, decrease to every other day for 2 weeks then every 3rd day for 2 weeks and stop. Monitor for changes in feet discomfort. Check blood sugar when she feel clammy. Hydralazine 25 mg 3 times per day added today. For now no changes in Metoprolol. Monitor heart rate and blood pressure at home. Please be sure medication list is accurate.  You need to schedule appointment with endocrinologist. Keep appt with urologist.  If a new problem present, please set up appointment sooner than planned today.  AVS will be mailed.

## 2020-08-19 NOTE — Assessment & Plan Note (Signed)
Problem has been poorly controlled. Sample of Humalog given. No changes in Lantus dose. Stressed the importance of following with her endocrinologist.

## 2020-08-19 NOTE — Patient Instructions (Addendum)
A few things to remember from today's visit:   Palpitations - Plan: EKG 65-KPTW, Basic metabolic panel, TSH  Memory difficulties - Plan: Ambulatory referral to Neuropsychology, TSH  Bilateral lower extremity edema - Plan: Brain Natriuretic Peptide, Basic metabolic panel  Essential hypertension  If you need refills please call your pharmacy. Do not use My Chart to request refills or for acute issues that need immediate attention.   Start weaning off Gabapentin 300 mg, decrease to every other day for 2 weeks then every 3rd day for 2 weeks and stop. Monitor for changes in feet discomfort. Check blood sugar when she feel clammy. Hydralazine 25 mg 3 times per day added today. For now no changes in Metoprolol. Monitor heart rate and blood pressure at home. Please be sure medication list is accurate.  You need to schedule appointment with endocrinologist. Keep appt with urologist.  If a new problem present, please set up appointment sooner than planned today.

## 2020-08-20 LAB — BASIC METABOLIC PANEL
BUN: 27 mg/dL — ABNORMAL HIGH (ref 6–23)
CO2: 28 mEq/L (ref 19–32)
Calcium: 9.7 mg/dL (ref 8.4–10.5)
Chloride: 104 mEq/L (ref 96–112)
Creatinine, Ser: 0.9 mg/dL (ref 0.40–1.20)
GFR: 69.12 mL/min (ref >60.00)
Glucose, Bld: 200 mg/dL — ABNORMAL HIGH (ref 70–99)
Potassium: 4.1 mEq/L (ref 3.5–5.1)
Sodium: 138 mEq/L (ref 135–145)

## 2020-08-20 LAB — TSH: TSH: 0.5 u[IU]/mL (ref 0.35–4.50)

## 2020-08-20 LAB — BRAIN NATRIURETIC PEPTIDE: Brain Natriuretic Peptide: 115 pg/mL — ABNORMAL HIGH (ref <100)

## 2020-09-03 NOTE — Progress Notes (Signed)
HEMATOLOGY/ONCOLOGY CONSULTATION NOTE  Date of Service: 09/04/2020  Patient Care Team: [name], [name], MD as PCP - General (Family Medicine)  CHIEF COMPLAINTS/PURPOSE OF CONSULTATION:  F/u for CLL/SLL  HISTORY OF PRESENTING ILLNESS:   [name] is a wonderful 60 y.o. 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 The pt reports that she is doing well overall.   The pt reports that her axillary lymphadenopathy was picked up during a routing Mammogram on 05/31/2019. Pt denies feeling any lumps or bumps in either axillary regions. Pt has felt the same for the last 6-12 months.   She has HTN and Diabetes. Her HTN is currently well-controlled but her Diabetes has been harder to control. Pt notes that "sometimes it's up and sometimes it's down". 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 She feels that the reaction on her left side is a little slower than her right side, but nothing that's noticeable. She denies any weakness, vision changes, or other residual symptoms from either stroke. 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 Pt made a full recovery and has no remaining symptoms. Her eczema is seasonal and not very bothersome. She denies any concerns for frequent infections. Pt has neuropathy in her hands/fingers and has been prescribed Gabapentin, which is helping.   Pt does not drink much alcohol and has never been a smoker. Her father passed from [place] and had a history of smoking. Her mother passed from a stroke last spring. 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 She is living alone and has one sister in the area.   Pt has had her first dose of the COVID19 vaccine and tolerated it well. She has the second dose scheduled.   Of note prior to the patient's visit today, pt has had Flow Pathology  ([id number]) completed on 06/26/2019 with results revealing "Monoclonal B-cell population identified."  Pt has had Right Axilla LN Bx ([id number]) completed on 06/26/2019 with results revealing "NON-HODGKIN B-CELL LYMPHOMA"   Pt has also had Mammogram ([id number]) completed on 05/31/2019 with results revealing "Further evaluation is suggested for prominent lymph nodes in the bilateral axilla."  Most recent lab results (03/13/2019) of CBC and CMP is as follows: all values are WNL except for RBC at 5.61, MCV at 78.8, MCH at 25.8, Sodium at 131, Glucose at 502, BUN at 24.  On review of systems, pt reports hard stools, tingling/numbness in fingers/hands and denies weakness, vision changes, fevers, chills, night sweats, SOB, fatigue, constipation, diarrhea and any other symptoms.   On PMHx the pt reports Guillain-Barr syndrome, Type II Diabetes, Atopic Eczema, HLD, HTN, Stroke x2, Neuropathy. On Social Hx the pt reports that she does not drink much alcohol and is a non-smoker.  INTERVAL HISTORY:   [name] is a wonderful 60 y.o. female who is here for evaluation and management of Non-Hodgkin's Lymphoma. The patient's last visit with [place] was on 05/08/2020. The pt reports that she is doing well overall.  The pt reports no new concerns over the last four months. She has been feeling the same except with trying to control her sugars. The pt notes she has been having dizzy spells due to going prolonged periods without eating due to her job and forgetting to eat. 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 She notes no new symptoms. The pt notes much recent stress with work. The pt notes that sometimes it is difficult to pass her bowels, but when they come out they are often soft. She is confused with how this has been happening.  Lab results today 09/04/2020 of CBC w/diff and CMP is as follows: all values are WNL except for MCV of 76.9, MCH of 25.6, Glucose of 353,  Creatinine of 1.21, Albumin of 3.2, GFR est of 51.  09/04/2020 LDH of 227.  On review of systems, pt reports chronic hot flashes, stress and denies fevers, chills, night sweats, new lumps/bumps, leg swelling, and any other symptoms.  MEDICAL HISTORY:  Past Medical History:  Diagnosis Date  . Abnormality of gait 05/10/2010  . BACK PAIN 11/14/2008  . CEREBROVASCULAR DISEASE 08/05/2008  . DIABETES MELLITUS, TYPE II 07/15/2008  . Diplopia 07/15/2008  . ECZEMA, ATOPIC 04/03/2009  . Guillain-Barre ([place])   . HYPERLIPIDEMIA 03/06/2009  . HYPERTENSION 07/15/2008  . Stroke ([place]) 2010   x2   . Vertebral artery stenosis     SURGICAL HISTORY: Past Surgical History:  Procedure Laterality Date  . ABDOMINAL HYSTERECTOMY    . DILATION AND CURETTAGE OF UTERUS    . FOOT SURGERY      SOCIAL HISTORY: Social History   Socioeconomic History  . Marital status: Single    Spouse name: Not on file  . Number of children: Not on file  . Years of education: Not on file  . Highest education level: Not on file  Occupational History  . Not on file  Tobacco Use  . Smoking status: Never Smoker  . Smokeless tobacco: Never Used  Vaping Use  . Vaping Use: Never used  Substance and Sexual Activity  . Alcohol use: No  . Drug use: No  . Sexual activity: Not on file  Other Topics Concern  . Not on file  Social History Narrative  . Not on file   Social Determinants of Health   Financial Resource Strain: Not on file  Food Insecurity: Not on file  Transportation Needs: Not on file  Physical Activity: Not on file  Stress: Not on file  Social Connections: Not on file  Intimate Partner Violence: Not on file    FAMILY HISTORY: Family History  Problem Relation Age of Onset  . Diabetes Sister   . Asthma Other   . Stroke Other   . Hypertension Other   . Diabetes Mother   . Stroke Mother   . Cancer Father   . Breast cancer Neg Hx     ALLERGIES:  has No Known Allergies.  MEDICATIONS:  Current  Outpatient Medications  Medication Sig Dispense Refill  . acetaminophen (TYLENOL) 500 MG tablet Take 2 tablets (1,000 mg total) by mouth every 6 (six) hours as needed. 30 tablet 0  . amLODipine (NORVASC) 10 MG tablet Take 1 tablet (10 mg total) by mouth daily. 90 tablet 3  . atorvastatin (LIPITOR) 40 MG tablet Take 1 tablet (40 mg total) by mouth daily. 90 tablet 3  . Blood Pressure Monitoring (BLOOD PRESSURE MONITOR AUTOMAT) DEVI 1 Device by Does not apply route daily. 1 Device 0  . clopidogrel (PLAVIX) 75 MG tablet Take 1 tablet (75 mg total) by mouth daily. 90 tablet 3  . Dulaglutide (TRULICITY) A999333 0000000 SOPN Inject 0.75 mg into the skin once a week. 1 mL 0  . famotidine (PEPCID) 20 MG tablet Take 1 tablet by mouth twice daily for 14  days 28 tablet 0  . FLUoxetine (PROZAC) 20 MG capsule Take 1 capsule (20 mg total) by mouth daily. 90 capsule 2  . glucose blood (CONTOUR NEXT TEST) test strip 1 each by Other route 2 (two) times daily. And lancets 2/day 200 each 3  . glucose blood (ONETOUCH VERIO) test strip USE TO CHECK BLOOD SUGAR TWICE A DAY AND PRN 100 each 6  . hydrALAZINE (APRESOLINE) 25 MG tablet Take 1 tablet (25 mg total) by mouth 3 (three) times daily. 90 tablet 2  . insulin glargine (LANTUS) 100 UNIT/ML Solostar Pen Inject 120 Units into the skin every morning. And pen needles 1/day 105 mL 1  . insulin lispro (HUMALOG) 100 UNIT/ML injection 13 U 5-10 min before meals or with meals.Extra Humalog insulin if sugars is over 150 as follow:Add 1 unit of Humalog if your sugar is 150-200. Add 3 units of HumaLog if your sugar is 201-250.Add 5 units of Humalog if your sugar is 251-300 Add 7 units of Humalog if your sugar is over 301 10 mL 3  . lisinopril-hydrochlorothiazide (ZESTORETIC) 20-12.5 MG tablet Take 2 tablets by mouth daily. (Patient taking differently: Take 1 tablet by mouth daily.) 180 tablet 1  . metoprolol succinate (TOPROL-XL) 50 MG 24 hr tablet Take 1 tablet (50 mg total) by  mouth daily. Take with or immediately following a meal. 30 tablet 0  . omeprazole (PRILOSEC) 20 MG capsule Take 1 capsule by mouth once daily 90 capsule 2  . ONE TOUCH LANCETS MISC USE TO CHECK BLOOD SUGAR TWICE A DAY AND PRN 100 each 6  . triamcinolone ointment (KENALOG) 0.5 % Apply topically 2 (two) times daily as needed. 45 g 1   No current facility-administered medications for this visit.    REVIEW OF SYSTEMS:   10 Point review of Systems was done is negative except as noted above.   PHYSICAL EXAMINATION: ECOG PERFORMANCE STATUS: 0 - Asymptomatic  . Vitals:   09/04/20 1151  BP: (!) 151/76  Pulse: 63  Resp: 18  Temp: (!) 97 F (36.1 C)  SpO2: 100%   Filed Weights   09/04/20 1151  Weight: 193 lb 8 oz (87.8 kg)   .Body mass index is 33.21 kg/m.     GENERAL:alert, in no acute distress and comfortable SKIN: no acute rashes, no significant lesions EYES: conjunctiva are pink and non-injected, sclera anicteric OROPHARYNX: MMM, no exudates, no oropharyngeal erythema or ulceration NECK: supple, no JVD LYMPH:  no palpable lymphadenopathy in the inguinal region. Small, palpable lymph nodes in cervical and axillary regions. LUNGS: clear to auscultation b/l with normal respiratory effort HEART: regular rate & rhythm ABDOMEN:  normoactive bowel sounds , non tender, not distended. No palpable hepatosplenomegaly.  Extremity: no pedal edema PSYCH: alert & oriented x 3 with fluent speech NEURO: no focal motor/sensory deficits  LABORATORY DATA:  I have reviewed the data as listed  . CBC Latest Ref Rng & Units 09/04/2020 05/08/2020 04/08/2020  WBC 4.0 - 10.5 K/uL 5.7 5.6 6.4  Hemoglobin 12.0 - 15.0 g/dL 12.1 12.6 13.3  Hematocrit 36.0 - 46.0 % 36.3 38.7 41.1  Platelets 150 - 400 K/uL 238 246 249    . CMP Latest Ref Rng & Units 09/04/2020 08/19/2020 05/08/2020  Glucose 70 - 99 mg/dL 353(H) 200(H) 213(H)  BUN 8 - 23 mg/dL 21 27(H) 18  Creatinine 0.44 - 1.00 mg/dL 1.21(H) 0.90 0.96   Sodium 135 - 145 mmol/L 137 138 136  Potassium 3.5 - 5.1 mmol/L 4.3  4.1 4.6  Chloride 98 - 111 mmol/L 102 104 102  CO2 22 - 32 mmol/L 28 28 28   Calcium 8.9 - 10.3 mg/dL 9.7 9.7 9.9  Total Protein 6.5 - 8.1 g/dL 7.3 - 7.7  Total Bilirubin 0.3 - 1.2 mg/dL 0.4 - 0.4  Alkaline Phos 38 - 126 U/L 99 - 94  AST 15 - 41 U/L 16 - 17  ALT 0 - 44 U/L 18 - 20   07/16/2019 FISH-CLL Prognostic Panel:         RADIOGRAPHIC STUDIES: I have personally reviewed the radiological images as listed and agreed with the findings in the report. No results found.  ASSESSMENT & PLAN:    59 yo with   1) Rai stage 1 CLL/SLL  Diagnosed incidentally as LNadenopathy noted on routine screening mamogram. No consitutional symptoms.  05/31/2019 Mammogram ([id number]) revealed "Further evaluation is suggested for prominent lymph nodes in the bilateral axilla." 06/26/2019 Flow Pathology ([id number]) revealed "Monoclonal B-cell population identified." 06/26/2019 Right Axilla LN Bx ([id number]) revealed "NON-HODGKIN B-CELL LYMPHOMA"  07/16/2019 FISH/CLL Prognostic Panel ([id number]) revealed "Trisomy 12 (+12) is present."  10/15/2019 CT Soft Tissue Neck ([id number]) revealed bilateral cervical lymphadenopathy. 10/15/2019 CT C/A/P ([id number]) ([id number]) revealed several small lymph nodes in axillary, retroperitoneal, and pelvic regions. Incidentally noted complex renal cyst.   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 -Advised pt that her elevated blood sugars are playing a role in the dehydration on labs. -Recommended pt continue to f/u w PCP regarding insulin and blood sugars management. Advised pt to create a consistent eating schedule and avoid skipping meals. -Recommended OTC Miralax for regular  bowel habits. Increase liquids ad fiber in diet. -Recommended pt increase fresh fruits and vegetables in diet. -Advised pt it would be okay to proceed with repeat MMG at this time. -No lab or clinical evidence of  Non-Hodgkin's Lymphoma progression necessitating treatment at this time. -Will see back in 6 months with labs.   FOLLOW UP: RTC with Dr Irene Limbo with labs in 6 months   The total time spent in the appt was 20 minutes and more than 50% was on counseling and direct patient cares.  All of the patient's questions were answered with apparent satisfaction. The patient knows to call the clinic with any problems, questions or concerns.    Sullivan Lone MD [place] AAHIVMS [facility] Hematology/Oncology Physician [facility]  (Office):       [phone] (Work cell):  [phone] (Fax):           [phone]  09/04/2020 12:36 PM  I, Reinaldo Raddle, am acting as scribe for Dr. Sullivan Lone, MD.     .I have reviewed the above documentation for accuracy and completeness, and I agree with the above. Brunetta Genera MD

## 2020-09-04 ENCOUNTER — Inpatient Hospital Stay (HOSPITAL_BASED_OUTPATIENT_CLINIC_OR_DEPARTMENT_OTHER): Payer: BC Managed Care – PPO | Admitting: Hematology

## 2020-09-04 ENCOUNTER — Other Ambulatory Visit: Payer: Self-pay

## 2020-09-04 ENCOUNTER — Inpatient Hospital Stay: Payer: BC Managed Care – PPO | Attending: Hematology

## 2020-09-04 VITALS — BP 151/76 | HR 63 | Temp 97.0°F | Resp 18 | Ht 64.0 in | Wt 193.5 lb

## 2020-09-04 DIAGNOSIS — C8300 Small cell B-cell lymphoma, unspecified site: Secondary | ICD-10-CM

## 2020-09-04 DIAGNOSIS — C8514 Unspecified B-cell lymphoma, lymph nodes of axilla and upper limb: Secondary | ICD-10-CM | POA: Insufficient documentation

## 2020-09-04 LAB — CBC WITH DIFFERENTIAL/PLATELET
Abs Immature Granulocytes: 0.01 10*3/uL (ref 0.00–0.07)
Basophils Absolute: 0 10*3/uL (ref 0.0–0.1)
Basophils Relative: 1 %
Eosinophils Absolute: 0.1 10*3/uL (ref 0.0–0.5)
Eosinophils Relative: 2 %
HCT: 36.3 % (ref 36.0–46.0)
Hemoglobin: 12.1 g/dL (ref 12.0–15.0)
Immature Granulocytes: 0 %
Lymphocytes Relative: 39 %
Lymphs Abs: 2.2 10*3/uL (ref 0.7–4.0)
MCH: 25.6 pg — ABNORMAL LOW (ref 26.0–34.0)
MCHC: 33.3 g/dL (ref 30.0–36.0)
MCV: 76.9 fL — ABNORMAL LOW (ref 80.0–100.0)
Monocytes Absolute: 0.7 10*3/uL (ref 0.1–1.0)
Monocytes Relative: 12 %
Neutro Abs: 2.6 10*3/uL (ref 1.7–7.7)
Neutrophils Relative %: 46 %
Platelets: 238 10*3/uL (ref 150–400)
RBC: 4.72 MIL/uL (ref 3.87–5.11)
RDW: 14.4 % (ref 11.5–15.5)
WBC: 5.7 10*3/uL (ref 4.0–10.5)
nRBC: 0 % (ref 0.0–0.2)

## 2020-09-04 LAB — LACTATE DEHYDROGENASE: LDH: 227 U/L — ABNORMAL HIGH (ref 98–192)

## 2020-09-04 LAB — CMP (CANCER CENTER ONLY)
ALT: 18 U/L (ref 0–44)
AST: 16 U/L (ref 15–41)
Albumin: 3.2 g/dL — ABNORMAL LOW (ref 3.5–5.0)
Alkaline Phosphatase: 99 U/L (ref 38–126)
Anion gap: 7 (ref 5–15)
BUN: 21 mg/dL (ref 8–23)
CO2: 28 mmol/L (ref 22–32)
Calcium: 9.7 mg/dL (ref 8.9–10.3)
Chloride: 102 mmol/L (ref 98–111)
Creatinine: 1.21 mg/dL — ABNORMAL HIGH (ref 0.44–1.00)
GFR, Estimated: 51 mL/min — ABNORMAL LOW (ref >60)
Glucose, Bld: 353 mg/dL — ABNORMAL HIGH (ref 70–99)
Potassium: 4.3 mmol/L (ref 3.5–5.1)
Sodium: 137 mmol/L (ref 135–145)
Total Bilirubin: 0.4 mg/dL (ref 0.3–1.2)
Total Protein: 7.3 g/dL (ref 6.5–8.1)

## 2020-09-08 ENCOUNTER — Telehealth: Payer: Self-pay | Admitting: Hematology

## 2020-09-08 NOTE — Telephone Encounter (Signed)
Left message with follow-up appointment per 5/6 los. Gave option to call back to reschedule if needed.

## 2020-09-21 ENCOUNTER — Encounter: Payer: Self-pay | Admitting: Gastroenterology

## 2020-09-21 ENCOUNTER — Ambulatory Visit: Payer: BC Managed Care – PPO | Admitting: Gastroenterology

## 2020-09-21 ENCOUNTER — Telehealth: Payer: Self-pay | Admitting: *Deleted

## 2020-09-21 VITALS — BP 148/90 | HR 76 | Ht 63.25 in | Wt 194.2 lb

## 2020-09-21 DIAGNOSIS — Z1211 Encounter for screening for malignant neoplasm of colon: Secondary | ICD-10-CM

## 2020-09-21 MED ORDER — SUPREP BOWEL PREP KIT 17.5-3.13-1.6 GM/177ML PO SOLN: 1.0000 | Freq: Once | ORAL | 0 refills | Status: AC

## 2020-09-21 NOTE — Progress Notes (Signed)
[name]    [id number]    [birth year]  Primary Care Physician:Jordan, Malka So, MD  Referring Physician: [name], [name], MD [place],  [place] [address]   Chief complaint: Colorectal cancer screening  HPI: 60 year old very pleasant female with history of non-Hodgkin's lymphoma, hypertension, diabetes, hyperlipidemia, stroke on chronic Plavix here to discuss colorectal cancer screening  She has remote history of Guillain-Barre syndrome  Denies any nausea, vomiting, abdominal pain, melena or bright red blood per rectum  Complains of excess gas and flatulence, is worse when she consumes milk.  She does not drink milk all the time.  Also notices it when she eats ice cream.  Colonoscopy 03/05/2010 1) No polyps or cancers 2) Otherwise normal examination RECOMMENDATIONS:1) Continue current colorectal screening recommendations for "routine risk" patients with a repeat colonoscopy in 10 years.  Outpatient Encounter Medications as of 09/21/2020  Medication Sig  . acetaminophen (TYLENOL) 500 MG tablet Take 2 tablets (1,000 mg total) by mouth every 6 (six) hours as needed.  [name] amLODipine (NORVASC) 10 MG tablet Take 1 tablet (10 mg total) by mouth daily.  [name] atorvastatin (LIPITOR) 40 MG tablet Take 1 tablet (40 mg total) by mouth daily.  . Blood Pressure Monitoring (BLOOD PRESSURE MONITOR AUTOMAT) DEVI 1 Device by Does not apply route daily.  . clopidogrel (PLAVIX) 75 MG tablet Take 1 tablet (75 mg total) by mouth daily.  . Dulaglutide (TRULICITY) 5.62 ZH/0.8MV SOPN Inject 0.75 mg into the skin once a week.  . famotidine (PEPCID) 20 MG tablet Take 1 tablet by mouth twice daily for 14 days  . FLUoxetine (PROZAC) 20 MG capsule Take 1 capsule (20 mg total) by mouth daily.  [name] glucose blood (CONTOUR NEXT TEST) test strip 1 each by Other route 2 (two) times daily. And lancets 2/day  . glucose blood (ONETOUCH VERIO) test strip USE TO CHECK BLOOD SUGAR TWICE A DAY  AND PRN  . hydrALAZINE (APRESOLINE) 25 MG tablet Take 1 tablet (25 mg total) by mouth 3 (three) times daily.  . insulin glargine (LANTUS) 100 UNIT/ML Solostar Pen Inject 120 Units into the skin every morning. And pen needles 1/day  . insulin lispro (HUMALOG) 100 UNIT/ML injection 13 U 5-10 min before meals or with meals.Extra Humalog insulin if sugars is over 150 as follow:Add 1 unit of Humalog if your sugar is 150-200. Add 3 units of HumaLog if your sugar is 201-250.Add 5 units of Humalog if your sugar is 251-300 Add 7 units of Humalog if your sugar is over 301  . lisinopril-hydrochlorothiazide (ZESTORETIC) 20-12.5 MG tablet Take 2 tablets by mouth daily. (Patient taking differently: Take 1 tablet by mouth daily.)  . metoprolol succinate (TOPROL-XL) 50 MG 24 hr tablet Take 1 tablet (50 mg total) by mouth daily. Take with or immediately following a meal.  . omeprazole (PRILOSEC) 20 MG capsule Take 1 capsule by mouth once daily  . ONE TOUCH LANCETS MISC USE TO CHECK BLOOD SUGAR TWICE A DAY AND PRN  . triamcinolone ointment (KENALOG) 0.5 % Apply topically 2 (two) times daily as needed.   No facility-administered encounter medications on file as of 09/21/2020.    Allergies as of 09/21/2020  . (No Known Allergies)    Past Medical History:  Diagnosis Date  . Abnormality of gait 05/10/2010  . BACK PAIN 11/14/2008  . CEREBROVASCULAR DISEASE 08/05/2008  . DIABETES MELLITUS, TYPE II 07/15/2008  . Diplopia 07/15/2008  . ECZEMA,  ATOPIC 04/03/2009  . Guillain-Barre ([place])   . HYPERLIPIDEMIA 03/06/2009  . HYPERTENSION 07/15/2008  . Stroke ([place]) 2010   x2   . Vertebral artery stenosis     Past Surgical History:  Procedure Laterality Date  . ABDOMINAL HYSTERECTOMY    . DILATION AND CURETTAGE OF UTERUS    . FOOT SURGERY      Family History  Problem Relation Age of Onset  . Diabetes Sister   . Asthma Other   . Stroke Other   . Hypertension Other   . Diabetes Mother   . Stroke Mother   . Cancer  Father   . Breast cancer Neg Hx     Social History   Socioeconomic History  . Marital status: Single    Spouse name: Not on file  . Number of children: Not on file  . Years of education: Not on file  . Highest education level: Not on file  Occupational History  . Not on file  Tobacco Use  . Smoking status: Never Smoker  . Smokeless tobacco: Never Used  Vaping Use  . Vaping Use: Never used  Substance and Sexual Activity  . Alcohol use: No  . Drug use: No  . Sexual activity: Not on file  Other Topics Concern  . Not on file  Social History Narrative  . Not on file   Social Determinants of Health   Financial Resource Strain: Not on file  Food Insecurity: Not on file  Transportation Needs: Not on file  Physical Activity: Not on file  Stress: Not on file  Social Connections: Not on file  Intimate Partner Violence: Not on file      Review of systems: All other review of systems negative except as mentioned in the HPI.   Physical Exam: Vitals:   09/21/20 1410  BP: (!) 148/90  Pulse: 76   Body mass index is 34.14 kg/m. Gen:      No acute distress HEENT:  sclera anicteric Abd:      soft, non-tender; no palpable masses, no distension Ext:    No edema Neuro: alert and oriented x 3 Psych: normal mood and affect  Data Reviewed:  Reviewed labs, radiology imaging, old records and pertinent past GI work up   Assessment and Plan/Recommendations:  61 year old very pleasant female with history of hypertension, diabetes, hyperlipidemia, stroke X2 on chronic antiplatelet therapy with Plavix and non-Hodgkin's lymphoma Due for colorectal cancer screening, will schedule colonoscopy The risks and benefits as well as alternatives of endoscopic procedure(s) have been discussed and reviewed. All questions answered. The patient agrees to proceed.  Will obtain clearance from prescribing provider to hold Plavix for 5 days prior to the procedure  Excess gas and flatulence  likely secondary to lactose intolerance Trial of lactose-free diet Advised patient to switch to Lactaid milk or use Lactaid pills when she consumes milk or milk products   The patient was provided an opportunity to ask questions and all were answered. The patient agreed with the plan and demonstrated an understanding of the instructions.  Damaris Hippo , MD    CC: [name], [name], MD

## 2020-09-21 NOTE — Telephone Encounter (Signed)
  Request for surgical clearance:     Endoscopy Procedure  What type of surgery is being performed?     Colonoscopy   When is this surgery scheduled?     10/05/2020  What type of clearance is required ?   Pharmacy  Are there any medications that need to be held prior to surgery and how long? Plavix  5 days   Practice name and name of physician performing surgery?      [facility]  What is your office phone and fax number?      Phone- [phone]  [phone]  Anesthesia type (None, local, MAC, general) ?       MAC

## 2020-09-21 NOTE — Telephone Encounter (Signed)
   Pt is not seen in our clinic.  Clopidogrel (Plavix) is managed by primary care.    Richardson Dopp, PA-C    09/21/2020 3:33 PM

## 2020-09-21 NOTE — Patient Instructions (Signed)
You have been scheduled for a colonoscopy. Please follow written instructions given to you at your visit today.  Please pick up your prep supplies at the pharmacy within the next 1-3 days. If you use inhalers (even only as needed), please bring them with you on the day of your procedure.   You will be contacted by our office prior to your procedure for directions on holding your Plavix.  If you do not hear from our office 1 week prior to your scheduled procedure, please call [phone] to discuss.   OK to use lactaid  tablets when using milk or milk products    Lactose-Free Diet, Adult If you have lactose intolerance, you are not able to digest lactose. Lactose is a natural sugar found mainly in dairy milk and dairy products. You may need to avoid all foods and beverages that contain lactose. A lactose-free diet can help you do this. Which foods have lactose? Lactose is found in dairy milk and dairy products, such as:  Yogurt.  Cheese.  Butter.  Margarine.  Sour cream.  Cream.  Whipped toppings and nondairy creamers.  Ice cream and other dairy-based desserts. Lactose is also found in foods or products made with dairy milk or milk ingredients. To find out whether a food contains dairy milk or a milk ingredient, look at the ingredients list. Avoid foods with the statement "May contain milk" and foods that contain:  Milk powder.  Whey.  Curd.  Caseinate.  Lactose.  Lactalbumin.  Lactoglobulin. What are alternatives to dairy milk and foods made with milk products?  Lactose-free milk.  Soy milk with added calcium and vitamin D.  Almond milk, coconut milk, rice milk, or other nondairy milk alternatives with added calcium and vitamin D. Note that these are low in protein.  Soy products, such as soy yogurt, soy cheese, soy ice cream, and soy-based sour cream.  Other nut milk products, such as almond yogurt, almond cheese, cashew yogurt, cashew cheese, cashew ice  cream, coconut yogurt, and coconut ice cream. What are tips for following this plan?  Do not consume foods, beverages, vitamins, minerals, or medicines containing lactose. Read ingredient lists carefully.  Look for the words "lactose-free" on labels.  Use lactase enzyme drops or tablets as directed by your health care provider.  Use lactose-free milk or a milk alternative, such as soy milk or almond milk, for drinking and cooking.  Make sure you get enough calcium and vitamin D in your diet. A lactose-free eating plan can be lacking in these important nutrients.  Take calcium and vitamin D supplements as directed by your health care provider. Talk to your health care provider about supplements if you are not able to get enough calcium and vitamin D from food. What foods can I eat? Fruits All fresh, canned, frozen, or dried fruits that are not processed with lactose. Vegetables All fresh, frozen, and canned vegetables without cheese, cream, or butter sauces. Grains Any that are not made with dairy milk or dairy products. Meats and other proteins Any meat, fish, poultry, and other protein sources that are not made with dairy milk or dairy products. Soy cheese and yogurt. Fats and oils Any that are not made with dairy milk or dairy products. Beverages Lactose-free milk. Soy, rice, or almond milk with added calcium and vitamin D. Fruit and vegetable juices. Sweets and desserts Any that are not made with dairy milk or dairy products. Seasonings and condiments Any that are not made with dairy milk or  dairy products. Calcium Calcium is found in many foods that contain lactose and is important for bone health. The amount of calcium you need depends on your age:  Adults younger than 50 years: 1,000 mg of calcium a day.  Adults older than 50 years: 1,200 mg of calcium a day. If you are not getting enough calcium, you may get it from other sources, including:  Orange juice with calcium  added. There are 300-350 mg of calcium in 1 cup of orange juice.  Calcium-fortified soy milk. There are 300-400 mg of calcium in 1 cup of calcium-fortified soy milk.  Calcium-fortified rice or almond milk. There are 300 mg of calcium in 1 cup of calcium-fortified rice or almond milk.  Calcium-fortified breakfast cereals. There are 100-1,000 mg of calcium in calcium-fortified breakfast cereals.  Spinach, cooked. There are 145 mg of calcium in  cup of cooked spinach.  Edamame, cooked. There are 130 mg of calcium in  cup of cooked edamame.  Collard greens, cooked. There are 125 mg of calcium in  cup of cooked collard greens.  Kale, frozen or cooked. There are 90 mg of calcium in  cup of cooked or frozen kale.  Almonds. There are 95 mg of calcium in  cup of almonds.  Broccoli, cooked. There are 60 mg of calcium in 1 cup of cooked broccoli. The items listed above may not be a complete list of recommended foods and beverages. Contact a dietitian for more options.   What foods are not recommended? Fruits None, unless they are made with dairy milk or dairy products. Vegetables None, unless they are made with dairy milk or dairy products. Grains Any grains that are made with dairy milk or dairy products. Meats and other proteins None, unless they are made with dairy milk or dairy products. Dairy All dairy products, including milk, goat's milk, buttermilk, kefir, acidophilus milk, flavored milk, evaporated milk, condensed milk, dulce de [place], eggnog, yogurt, cheese, and cheese spreads. Fats and oils Any that are made with milk or milk products. Margarines and salad dressings that contain milk or cheese. Cream. Half and half. Cream cheese. Sour cream. Chip dips made with sour cream or yogurt. Beverages Hot chocolate. Cocoa with lactose. Instant iced teas. Powdered fruit drinks. Smoothies made with dairy milk or yogurt. Sweets and desserts Any that are made with milk or milk  products. Seasonings and condiments Chewing gum that has lactose. Spice blends if they contain lactose. Artificial sweeteners that contain lactose. Nondairy creamers. The items listed above may not be a complete list of foods and beverages to avoid. Contact a dietitian for more information. Summary  If you are lactose intolerant, it means that you have a hard time digesting lactose, a natural sugar found in milk and milk products.  Following a lactose-free diet can help you manage this condition.  Calcium is important for bone health and is found in many foods that contain lactose. Talk with your health care provider about other sources of calcium. This information is not intended to replace advice given to you by your health care provider. Make sure you discuss any questions you have with your health care provider. Document Revised: 05/16/2017 Document Reviewed: 05/16/2017 Elsevier Patient Education  2021 [employer].  Due to recent changes in healthcare laws, you may see the results of your imaging and laboratory studies on MyChart before your provider has had a chance to review them.  We understand that in some cases there may be results that are  confusing or concerning to you. Not all laboratory results come back in the same time frame and the provider may be waiting for multiple results in order to interpret others.  Please give [place] 48 hours in order for your provider to thoroughly review all the results before contacting the office for clarification of your results.   Thank you for choosing [facility]  Karleen Hampshire Nandigam,MD

## 2020-09-29 ENCOUNTER — Telehealth: Payer: Self-pay | Admitting: Family Medicine

## 2020-09-29 NOTE — Telephone Encounter (Signed)
Please advise 

## 2020-09-29 NOTE — Telephone Encounter (Signed)
See other phone encounter.  

## 2020-09-29 NOTE — Telephone Encounter (Signed)
[name] is calling and stated that patient is scheduled to have colostomy on 6/6 and they need the ok to stop the Plavix 5 days prior to procedure. CB is [phone]

## 2020-09-29 NOTE — Telephone Encounter (Signed)
Called PCP's office  They will get back with me today also faxed them a letter today

## 2020-09-29 NOTE — Telephone Encounter (Signed)
If needed, Plavix can be discontinued 7 days before procedure. Thanks, BJ

## 2020-09-29 NOTE — Telephone Encounter (Signed)
Okay to hold Plavix 7 days prior to procedure if needed per PCP.

## 2020-09-30 NOTE — Telephone Encounter (Signed)
Left message on patients voicemail ok to hold and to call back so I can be sure she got the message

## 2020-10-05 ENCOUNTER — Encounter: Payer: BC Managed Care – PPO | Admitting: Gastroenterology

## 2020-10-06 ENCOUNTER — Other Ambulatory Visit: Payer: Self-pay | Admitting: Family Medicine

## 2020-10-20 ENCOUNTER — Encounter: Payer: Self-pay | Admitting: Endocrinology

## 2020-10-20 ENCOUNTER — Other Ambulatory Visit: Payer: Self-pay

## 2020-10-20 ENCOUNTER — Ambulatory Visit (INDEPENDENT_AMBULATORY_CARE_PROVIDER_SITE_OTHER): Payer: BC Managed Care – PPO | Admitting: Endocrinology

## 2020-10-20 VITALS — BP 164/90 | HR 74 | Ht 63.0 in | Wt 197.0 lb

## 2020-10-20 DIAGNOSIS — E1151 Type 2 diabetes mellitus with diabetic peripheral angiopathy without gangrene: Secondary | ICD-10-CM | POA: Diagnosis not present

## 2020-10-20 LAB — POCT GLYCOSYLATED HEMOGLOBIN (HGB A1C): Hemoglobin A1C: 10.7 % — AB (ref 4.0–5.6)

## 2020-10-20 MED ORDER — INSULIN GLARGINE 100 UNIT/ML SOLOSTAR PEN: 130.0000 [IU] | PEN_INJECTOR | SUBCUTANEOUS | 3 refills | Status: DC

## 2020-10-20 NOTE — Progress Notes (Signed)
Subjective:    Patient ID: [name], female    DOB: 1960-09-02, 60 y.o.   MRN: [id number]  HPI Pt returns for f/u of diabetes mellitus:  DM type: Insulin-requiring type 2.   Dx'ed: 2010.   Complications: cerebrovascular disease and PN Therapy: insulin since soon after dx.  GDM: never.  DKA: never.  Severe hypoglycemia: never.  Pancreatitis: never SDOH: pt gets insulin from mfgr, but she says she gets insulin inconsistently this way.  Other: She requests to take just 1 injection per day, as she is having difficulty remembering the multiple daily injections.  Interval history: She has health ins now.  Pt says she misses the Lantus 2/month.  She still gets the Lantus from pt assist.  Pt says she cannot remember to take Humalog.  She has not recently taken Trulicity.  no cbg record, but states cbg's vary from 90-197.  It is in general lowest in the middle of the night.   Past Medical History:  Diagnosis Date   Abnormality of gait 05/10/2010   BACK PAIN 11/14/2008   CEREBROVASCULAR DISEASE 08/05/2008   DIABETES MELLITUS, TYPE II 07/15/2008   Diplopia 07/15/2008   ECZEMA, ATOPIC 04/03/2009   Guillain-Barre ([place])    HYPERLIPIDEMIA 03/06/2009   HYPERTENSION 07/15/2008   Stroke ([place]) 2010, 2011   x2    Vertebral artery stenosis     Past Surgical History:  Procedure Laterality Date   ABDOMINAL HYSTERECTOMY     DILATION AND CURETTAGE OF UTERUS     FOOT SURGERY      Social History   Socioeconomic History   Marital status: Single    Spouse name: Not on file   Number of children: Not on file   Years of education: Not on file   Highest education level: Not on file  Occupational History   Not on file  Tobacco Use   Smoking status: Never   Smokeless tobacco: Never  Vaping Use   Vaping Use: Never used  Substance and Sexual Activity   Alcohol use: No   Drug use: No   Sexual activity: Not on file  Other Topics Concern   Not on file  Social History Narrative   Not on file    Social Determinants of Health   Financial Resource Strain: Not on file  Food Insecurity: Not on file  Transportation Needs: Not on file  Physical Activity: Not on file  Stress: Not on file  Social Connections: Not on file  Intimate Partner Violence: Not on file    Current Outpatient Medications on File Prior to Visit  Medication Sig Dispense Refill   acetaminophen (TYLENOL) 500 MG tablet Take 2 tablets (1,000 mg total) by mouth every 6 (six) hours as needed. 30 tablet 0   amLODipine (NORVASC) 10 MG tablet Take 1 tablet (10 mg total) by mouth daily. 90 tablet 3   atorvastatin (LIPITOR) 40 MG tablet Take 1 tablet (40 mg total) by mouth daily. 90 tablet 3   Blood Pressure Monitoring (BLOOD PRESSURE MONITOR AUTOMAT) DEVI 1 Device by Does not apply route daily. 1 Device 0   clopidogrel (PLAVIX) 75 MG tablet Take 1 tablet (75 mg total) by mouth daily. 90 tablet 3   famotidine (PEPCID) 20 MG tablet Take 1 tablet by mouth twice daily for 14 days 28 tablet 0   FLUoxetine (PROZAC) 20 MG capsule Take 1 capsule (20 mg total) by mouth daily. 90 capsule 2   glucose blood (CONTOUR NEXT TEST) test strip 1 each  by Other route 2 (two) times daily. And lancets 2/day 200 each 3   glucose blood (ONETOUCH VERIO) test strip USE TO CHECK BLOOD SUGAR TWICE A DAY AND PRN 100 each 6   hydrALAZINE (APRESOLINE) 25 MG tablet Take 1 tablet (25 mg total) by mouth 3 (three) times daily. 90 tablet 2   lisinopril-hydrochlorothiazide (ZESTORETIC) 20-12.5 MG tablet Take 2 tablets by mouth daily. (Patient taking differently: Take 1 tablet by mouth daily.) 180 tablet 1   metoprolol succinate (TOPROL-XL) 50 MG 24 hr tablet TAKE 1 TABLET BY MOUTH ONCE DAILY WITH  OR  IMMEDIATELY  FOLLOWING  A  MEAL 30 tablet 0   omeprazole (PRILOSEC) 20 MG capsule Take 1 capsule by mouth once daily 90 capsule 2   ONE TOUCH LANCETS MISC USE TO CHECK BLOOD SUGAR TWICE A DAY AND PRN 100 each 6   triamcinolone ointment (KENALOG) 0.5 % Apply  topically 2 (two) times daily as needed. 45 g 1   No current facility-administered medications on file prior to visit.    No Known Allergies  Family History  Problem Relation Age of Onset   Diabetes Sister    Asthma Other    Stroke Other    Hypertension Other    Diabetes Mother    Stroke Mother    Cancer Father        pt states hae had some kind of stomach cancer, ? stomach or colon   Breast cancer Neg Hx     BP (!) 164/90   Pulse 74   Ht 5\' 3"  (1.6 m)   Wt 197 lb (89.4 kg)   SpO2 97%   BMI 34.90 kg/m    Review of Systems She denies hypoglycemia.      Objective:   Physical Exam Pulses: dorsalis pedis intact bilat.   MSK: no deformity of the feet CV: trace bilat leg edema Skin:  no ulcer on the feet.  normal color and temp on the feet.  Neuro: sensation is intact to touch on the feet, but decreased from normal.   Ext: there is bilateral onychomycosis of the toenails.   A1c=10.7%     Assessment & Plan:  Insulin-requiring type 2 DM: uncontrolled Noncompliance, chronic and persistent.  She can only take minimum # of meds.    Patient Instructions  Please increase the Lantus to 130 units each morning.  Please let us know if you have trouble getting this from the manufacturer.  check your blood sugar  a day.  vary the time of day when you check, between before the 3 meals, and at bedtime.  also check if you have symptoms of your blood sugar being too high or too low.  please keep a record of the readings and bring it to your next appointment here (or you can bring the meter itself).  You can write it on any piece of paper.  please call us sooner if your blood sugar goes below 70, or if you have a lot of readings over 200.   Please come back for a follow-up appointment in 2 months.

## 2020-10-20 NOTE — Patient Instructions (Addendum)
Please increase the Lantus to 130 units each morning.  Please let us know if you have trouble getting this from the manufacturer.  check your blood sugar  a day.  vary the time of day when you check, between before the 3 meals, and at bedtime.  also check if you have symptoms of your blood sugar being too high or too low.  please keep a record of the readings and bring it to your next appointment here (or you can bring the meter itself).  You can write it on any piece of paper.  please call us sooner if your blood sugar goes below 70, or if you have a lot of readings over 200.   Please come back for a follow-up appointment in 2 months.

## 2020-10-29 ENCOUNTER — Other Ambulatory Visit: Payer: Self-pay

## 2020-10-29 ENCOUNTER — Other Ambulatory Visit: Payer: Self-pay | Admitting: Family Medicine

## 2020-10-29 ENCOUNTER — Ambulatory Visit: Payer: BC Managed Care – PPO

## 2020-10-29 DIAGNOSIS — M79645 Pain in left finger(s): Secondary | ICD-10-CM

## 2020-11-06 ENCOUNTER — Telehealth: Payer: Self-pay

## 2020-11-06 NOTE — Telephone Encounter (Signed)
Patient called in to get her Lantus patient assistance program forms filled out. Forms filled out & kept at this [facility] desk. Pt will come by the office next week to sign her portion.

## 2020-11-09 ENCOUNTER — Other Ambulatory Visit: Payer: Self-pay | Admitting: Family Medicine

## 2020-11-09 DIAGNOSIS — I10 Essential (primary) hypertension: Secondary | ICD-10-CM

## 2020-11-10 ENCOUNTER — Other Ambulatory Visit: Payer: Self-pay

## 2020-11-10 MED ORDER — CLOPIDOGREL BISULFATE 75 MG PO TABS: 75.0000 mg | ORAL_TABLET | Freq: Every day | ORAL | 3 refills | Status: DC

## 2020-12-18 ENCOUNTER — Telehealth: Payer: Self-pay | Admitting: Family Medicine

## 2020-12-18 NOTE — Telephone Encounter (Signed)
Patient called asking if any insulin has been delivered to the office for her.   After checking both fridges, and checking with pharmacist, no insulin has come in for patient.  Patient would like a call back at [phone] with update on status of insulin.  Please advise.

## 2020-12-21 NOTE — Telephone Encounter (Signed)
Tried calling the [employer] patient assistance line, they do not open until 9am. Will try again at that time.

## 2020-12-21 NOTE — Telephone Encounter (Signed)
I called and spoke with [employer], they have submitted the order for the patient. I called the patient and updated her. I let her know she will receive a phone call as soon as we receive the Lantus in the office. Patient verbalized understanding.

## 2020-12-24 ENCOUNTER — Ambulatory Visit: Payer: BC Managed Care – PPO | Admitting: Gastroenterology

## 2020-12-24 ENCOUNTER — Other Ambulatory Visit: Payer: Self-pay

## 2020-12-24 ENCOUNTER — Telehealth: Payer: Self-pay | Admitting: Family Medicine

## 2020-12-24 ENCOUNTER — Telehealth: Payer: Self-pay | Admitting: Endocrinology

## 2020-12-24 NOTE — Telephone Encounter (Signed)
LVM for pt to cb to the office in regards to her low BS. Asked that she let me know if she has been missing some of her insulin dose and what has she eaten in the last 24hrs and how much insulin has she been taken.

## 2020-12-24 NOTE — Telephone Encounter (Signed)
I meant to say 180-250

## 2020-12-24 NOTE — Progress Notes (Signed)
Pt here for procedure.  She is unsure if she is taking Plavix at this time.  She hasn't stopped any medications for this procedure; if she is still on the Plavix, she hasn't stopped it.  She also has a blood sugar of 359- states she "feels a little woozy today, but not sure if it's my blood sugar or from drinking all that stuff." She ate all day yesterday- hamburger, fish patty, and breakfast.  Stools are cloudy brown now and liquid. Discussed with Dr. Silverio Decamp and she would like to reschedule pt.  Per Dr. Silverio Decamp, pt to call PCP today to discuss high blood sugar and clarify if on Plavix.  If she feels worse, please go to ED.  Care partner brought back to admitting and recommendations given to both patient and care partner.  Understanding voiced.  Recall put in for 01-30-21 so patient will receive letter to schedule colonoscopy.

## 2020-12-24 NOTE — Telephone Encounter (Signed)
Patient called to let us know that when she went in for the colonoscopy the Doctor flagged her blood sugar as being high. Patient states Doctor told her to call her PCP and specialist to let them know about the high blood sugar level. Patient states she was told blood sugar was 359        Please advise

## 2020-12-24 NOTE — Telephone Encounter (Signed)
Pt was supposed to have a procedure today. Sugar was 359. They could not do the colonoscopy. Sugars have been up and down. Blood sugars have  180- 25. Pt would like a call back

## 2020-12-24 NOTE — Telephone Encounter (Signed)
Please advise 

## 2020-12-25 NOTE — Telephone Encounter (Signed)
She needs to contact her endocrinologist. Thanks, BJ

## 2020-12-25 NOTE — Telephone Encounter (Signed)
Pt informed of the message below and verbalized understanding. 

## 2020-12-26 ENCOUNTER — Emergency Department (HOSPITAL_COMMUNITY)
Admission: EM | Admit: 2020-12-26 | Discharge: 2020-12-26 | Disposition: A | Discharge status: Home / Self Care | Payer: BC Managed Care – PPO | Attending: Emergency Medicine | Admitting: Emergency Medicine

## 2020-12-26 ENCOUNTER — Encounter (HOSPITAL_COMMUNITY): Payer: Self-pay | Admitting: Emergency Medicine

## 2020-12-26 ENCOUNTER — Other Ambulatory Visit: Payer: Self-pay

## 2020-12-26 DIAGNOSIS — N390 Urinary tract infection, site not specified: Secondary | ICD-10-CM | POA: Insufficient documentation

## 2020-12-26 DIAGNOSIS — E1165 Type 2 diabetes mellitus with hyperglycemia: Secondary | ICD-10-CM | POA: Diagnosis not present

## 2020-12-26 DIAGNOSIS — E1140 Type 2 diabetes mellitus with diabetic neuropathy, unspecified: Secondary | ICD-10-CM | POA: Diagnosis not present

## 2020-12-26 DIAGNOSIS — Z794 Long term (current) use of insulin: Secondary | ICD-10-CM | POA: Diagnosis not present

## 2020-12-26 DIAGNOSIS — I10 Essential (primary) hypertension: Secondary | ICD-10-CM | POA: Diagnosis not present

## 2020-12-26 DIAGNOSIS — R42 Dizziness and giddiness: Secondary | ICD-10-CM | POA: Diagnosis present

## 2020-12-26 DIAGNOSIS — Z7902 Long term (current) use of antithrombotics/antiplatelets: Secondary | ICD-10-CM | POA: Insufficient documentation

## 2020-12-26 DIAGNOSIS — R3915 Urgency of urination: Secondary | ICD-10-CM | POA: Insufficient documentation

## 2020-12-26 DIAGNOSIS — Z79899 Other long term (current) drug therapy: Secondary | ICD-10-CM | POA: Insufficient documentation

## 2020-12-26 DIAGNOSIS — R739 Hyperglycemia, unspecified: Secondary | ICD-10-CM

## 2020-12-26 LAB — CBC WITH DIFFERENTIAL/PLATELET
Abs Immature Granulocytes: 0.01 10*3/uL (ref 0.00–0.07)
Basophils Absolute: 0 10*3/uL (ref 0.0–0.1)
Basophils Relative: 1 %
Eosinophils Absolute: 0.1 10*3/uL (ref 0.0–0.5)
Eosinophils Relative: 2 %
HCT: 41.2 % (ref 36.0–46.0)
Hemoglobin: 13.5 g/dL (ref 12.0–15.0)
Immature Granulocytes: 0 %
Lymphocytes Relative: 36 %
Lymphs Abs: 1.8 10*3/uL (ref 0.7–4.0)
MCH: 26 pg (ref 26.0–34.0)
MCHC: 32.8 g/dL (ref 30.0–36.0)
MCV: 79.2 fL — ABNORMAL LOW (ref 80.0–100.0)
Monocytes Absolute: 0.7 10*3/uL (ref 0.1–1.0)
Monocytes Relative: 13 %
Neutro Abs: 2.5 10*3/uL (ref 1.7–7.7)
Neutrophils Relative %: 48 %
Platelets: 247 10*3/uL (ref 150–400)
RBC: 5.2 MIL/uL — ABNORMAL HIGH (ref 3.87–5.11)
RDW: 14.6 % (ref 11.5–15.5)
WBC: 5.2 10*3/uL (ref 4.0–10.5)
nRBC: 0 % (ref 0.0–0.2)

## 2020-12-26 LAB — URINALYSIS, ROUTINE W REFLEX MICROSCOPIC
Bilirubin Urine: NEGATIVE
Glucose, UA: >=500 mg/dL — AB
Ketones, ur: NEGATIVE mg/dL
Nitrite: NEGATIVE
Protein, ur: 100 mg/dL — AB
Specific Gravity, Urine: 1.011 (ref 1.005–1.030)
pH: 5 (ref 5.0–8.0)

## 2020-12-26 LAB — CBG MONITORING, ED
Glucose-Capillary: 346 mg/dL — ABNORMAL HIGH (ref 70–99)
Glucose-Capillary: 186 mg/dL — ABNORMAL HIGH (ref 70–99)

## 2020-12-26 LAB — BASIC METABOLIC PANEL
Anion gap: 10 (ref 5–15)
BUN: 25 mg/dL — ABNORMAL HIGH (ref 8–23)
CO2: 25 mmol/L (ref 22–32)
Calcium: 9.9 mg/dL (ref 8.9–10.3)
Chloride: 101 mmol/L (ref 98–111)
Creatinine, Ser: 1.09 mg/dL — ABNORMAL HIGH (ref 0.44–1.00)
GFR, Estimated: 58 mL/min — ABNORMAL LOW (ref >60)
Glucose, Bld: 346 mg/dL — ABNORMAL HIGH (ref 70–99)
Potassium: 3.9 mmol/L (ref 3.5–5.1)
Sodium: 136 mmol/L (ref 135–145)

## 2020-12-26 MED ORDER — MECLIZINE HCL 12.5 MG PO TABS: 12.5000 mg | ORAL_TABLET | Freq: Three times a day (TID) | ORAL | 0 refills | Status: DC | PRN

## 2020-12-26 MED ORDER — MECLIZINE HCL 25 MG PO TABS
12.5000 mg | ORAL_TABLET | Freq: Once | ORAL | Status: AC
  Administered 2020-12-26: 12.5 mg via ORAL
  Filled 2020-12-26: qty 1

## 2020-12-26 MED ORDER — SODIUM CHLORIDE 0.9 % IV BOLUS
1000.0000 mL | Freq: Once | INTRAVENOUS | Status: AC
  Administered 2020-12-26: 1000 mL via INTRAVENOUS

## 2020-12-26 MED ORDER — CEPHALEXIN 500 MG PO CAPS: 500.0000 mg | ORAL_CAPSULE | Freq: Two times a day (BID) | ORAL | 0 refills | Status: AC

## 2020-12-26 NOTE — ED Notes (Signed)
Ambulatory to restroom with steady gait. Md made aware

## 2020-12-26 NOTE — ED Triage Notes (Addendum)
Pt reports to the ED with c/o hyperglycemia. Pt reports her sugar was 393 and normally runs between 100-200. Pt reports she took "130 units" of insulin this am. Also states when she's moving around, "it feels like everything around me is moving around" and this is relived by standing still or closing her eyes. Denies any other symptoms at this time.

## 2020-12-26 NOTE — ED Provider Notes (Signed)
[facility] DEPT Provider Note   CSN: [id number] Arrival date & time: 12/26/20  1246     History Chief Complaint  Patient presents with   Hyperglycemia    [name] is a 60 y.o. female.  60 yo female with history as below to ED for multiple complaints. Pt concerned her glucose was elevated, she took her typical lantus this morning. Glucose around 300 at home, typically she is around 200. No nausea or vomiting, no polydipsia or polyuria. No fevers chills. She also reports vertiginous dizziness intermittent over the last few months. She experienced this today while ambulating after quickly getting up from seated position. No a/w numbness or tingling, no HA, no falls, no neck pain. She has some increased urinary urgency.  No dysuria  The history is provided by the patient. No language interpreter was used.  Hyperglycemia Associated symptoms: dizziness   Associated symptoms: no abdominal pain, no chest pain, no confusion, no fever, no increased thirst, no nausea, no polyuria, no shortness of breath and no vomiting       Past Medical History:  Diagnosis Date   Abnormality of gait 05/10/2010   BACK PAIN 11/14/2008   CEREBROVASCULAR DISEASE 08/05/2008   DIABETES MELLITUS, TYPE II 07/15/2008   Diplopia 07/15/2008   ECZEMA, ATOPIC 04/03/2009   Guillain-Barre ([place])    HYPERLIPIDEMIA 03/06/2009   HYPERTENSION 07/15/2008   Stroke ([place]) 2010, 2011   x2    Vertebral artery stenosis     Patient Active Problem List   Diagnosis Date Noted   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   Trigger finger, left ring finger 10/04/2018   Alternating constipation and diarrhea 04/20/2018   Diabetic peripheral neuropathy associated with type 2 diabetes mellitus ([place]) 02/26/2018   DM (diabetes mellitus), type 2 with peripheral vascular complications ([place]) 14/78/2956   ABNORMALITY OF GAIT 05/10/2010   ECZEMA, ATOPIC 04/03/2009   Hyperlipidemia associated with type 2  diabetes mellitus ([place]) 03/06/2009   BACK PAIN 11/14/2008   Cerebrovascular disease 08/05/2008   DIPLOPIA 07/15/2008   Essential hypertension 07/15/2008    Past Surgical History:  Procedure Laterality Date   ABDOMINAL HYSTERECTOMY     DILATION AND CURETTAGE OF UTERUS     FOOT SURGERY       OB History   No obstetric history on file.     Family History  Problem Relation Age of Onset   Diabetes Sister    Asthma Other    Stroke Other    Hypertension Other    Diabetes Mother    Stroke Mother    Cancer Father        pt states hae had some kind of stomach cancer, ? stomach or colon   Breast cancer Neg Hx     Social History   Tobacco Use   Smoking status: Never   Smokeless tobacco: Never  Vaping Use   Vaping Use: Never used  Substance Use Topics   Alcohol use: No   Drug use: No    Home Medications Prior to Admission medications   Medication Sig Start Date End Date Taking? Authorizing Provider  amLODipine (NORVASC) 10 MG tablet Take 1 tablet by mouth once daily 11/09/20  Yes [name], [name], MD  atorvastatin (LIPITOR) 40 MG tablet Take 1 tablet (40 mg total) by mouth daily. 10/11/19  Yes [name], [name], MD  cephALEXin (KEFLEX) 500 MG capsule Take 1 capsule (500 mg total) by mouth 2 (two) times daily for 7 days. 12/26/20 01/02/21 Yes  Wynona Dove A, DO  FLUoxetine (PROZAC) 20 MG capsule Take 1 capsule (20 mg total) by mouth daily. 07/17/20  Yes [name], [name], MD  hydrALAZINE (APRESOLINE) 25 MG tablet Take 1 tablet (25 mg total) by mouth 3 (three) times daily. Patient taking differently: Take 25 mg by mouth daily. 08/19/20  Yes [name], [name], MD  insulin glargine (LANTUS) 100 UNIT/ML Solostar Pen Inject 130 Units into the skin every morning. And pen needles 1/day 10/20/20  Yes [name], MD  lisinopril-hydrochlorothiazide (ZESTORETIC) 20-12.5 MG tablet Take 2 tablets by mouth once daily 11/09/20  Yes [name], [name], MD  meclizine (ANTIVERT) 12.5 MG tablet Take 1 tablet  (12.5 mg total) by mouth 3 (three) times daily as needed for dizziness. 12/26/20  Yes Wynona Dove A, DO  metoprolol succinate (TOPROL-XL) 50 MG 24 hr tablet TAKE 1 TABLET BY MOUTH ONCE DAILY WITH  OR  IMMEDIATELY  FOLLOWING  A  MEAL 10/06/20  Yes [name], [name], MD  omeprazole (PRILOSEC) 20 MG capsule Take 1 capsule by mouth once daily 12/09/19  Yes [name], [name], MD  acetaminophen (TYLENOL) 500 MG tablet Take 2 tablets (1,000 mg total) by mouth every 6 (six) hours as needed. Patient not taking: Reported on 12/26/2020 11/19/19   [name], MD  Blood Pressure Monitoring (BLOOD PRESSURE MONITOR AUTOMAT) DEVI 1 Device by Does not apply route daily. 08/29/18   [name], [name], MD  clopidogrel (PLAVIX) 75 MG tablet Take 1 tablet (75 mg total) by mouth daily. Patient not taking: No sig reported 11/10/20   [name], [name], MD  famotidine (PEPCID) 20 MG tablet Take 1 tablet by mouth twice daily for 14 days Patient not taking: Reported on 12/26/2020 04/06/20   [name], [name], MD  glucose blood (CONTOUR NEXT TEST) test strip 1 each by Other route 2 (two) times daily. And lancets 2/day 03/09/18   [name], MD  glucose blood [facility] VERIO) test strip USE TO CHECK BLOOD SUGAR TWICE A DAY AND PRN 07/29/15   [name], MD  ONE TOUCH LANCETS MISC USE TO CHECK BLOOD SUGAR TWICE A DAY AND PRN 07/29/15   [name], MD  SUPREP BOWEL PREP KIT 17.5-3.13-1.6 GM/177ML SOLN Take 354 mLs by mouth once. Take the night before and the morning of colonoscopy. 12/23/20   [provider]  triamcinolone ointment (KENALOG) 0.5 % Apply topically 2 (two) times daily as needed. 10/23/19   [name], [name], MD    Allergies    Patient has no known allergies.  Review of Systems   Review of Systems  Constitutional:  Negative for chills and fever.  HENT:  Negative for facial swelling and trouble swallowing.   Eyes:  Negative for photophobia and visual disturbance.  Respiratory:  Negative for cough and  shortness of breath.   Cardiovascular:  Negative for chest pain and palpitations.  Gastrointestinal:  Negative for abdominal pain, nausea and vomiting.  Endocrine: Negative for polydipsia and polyuria.  Genitourinary:  Negative for difficulty urinating and hematuria.       Urgency  Musculoskeletal:  Negative for gait problem and joint swelling.  Skin:  Negative for pallor and rash.  Neurological:  Positive for dizziness. Negative for syncope and headaches.  Psychiatric/Behavioral:  Negative for agitation and confusion.    Physical Exam Updated Vital Signs BP (!) 136/106   Pulse (!) 54   Temp 98.6 F (37 C) (Oral)   Resp 16   Ht _0  (1.6 m)  Wt 89.4 kg   SpO2 98%   BMI 34.91 kg/m   Physical Exam Vitals and nursing note reviewed.  Constitutional:      General: She is not in acute distress.    Appearance: Normal appearance.  HENT:     Head: Normocephalic and atraumatic.     Right Ear: External ear normal.     Left Ear: External ear normal.     Nose: Nose normal.     Mouth/Throat:     Mouth: Mucous membranes are moist.  Eyes:     General: No visual field deficit or scleral icterus.       Right eye: No discharge.        Left eye: No discharge.     Extraocular Movements: Extraocular movements intact.     Pupils: Pupils are equal, round, and reactive to light.     Comments: Leftward horizontal fatigue-able nystagmus   Cardiovascular:     Rate and Rhythm: Normal rate and regular rhythm.     Pulses: Normal pulses.     Heart sounds: Normal heart sounds.  Pulmonary:     Effort: Pulmonary effort is normal. No respiratory distress.     Breath sounds: Normal breath sounds.  Abdominal:     General: Abdomen is flat.     Tenderness: There is no abdominal tenderness.  Musculoskeletal:        General: Normal range of motion.     Cervical back: Normal range of motion.     Right lower leg: No edema.     Left lower leg: No edema.  Skin:    General: Skin is warm and dry.      Capillary Refill: Capillary refill takes less than 2 seconds.  Neurological:     Mental Status: She is alert and oriented to person, place, and time.     GCS: GCS eye subscore is 4. GCS verbal subscore is 5. GCS motor subscore is 6.     Cranial Nerves: Cranial nerves are intact. No facial asymmetry.     Sensory: Sensation is intact.     Motor: Motor function is intact.     Coordination: Coordination is intact.     Gait: Gait is intact. Gait normal.  Psychiatric:        Mood and Affect: Mood normal.        Behavior: Behavior normal.    ED Results / Procedures / Treatments   Labs (all labs ordered are listed, but only abnormal results are displayed) Labs Reviewed  CBC WITH DIFFERENTIAL/PLATELET - Abnormal; Notable for the following components:      Result Value   RBC 5.20 (*)    MCV 79.2 (*)    All other components within normal limits  BASIC METABOLIC PANEL - Abnormal; Notable for the following components:   Glucose, Bld 346 (*)    BUN 25 (*)    Creatinine, Ser 1.09 (*)    GFR, Estimated 58 (*)    All other components within normal limits  URINALYSIS, ROUTINE W REFLEX MICROSCOPIC - Abnormal; Notable for the following components:   APPearance HAZY (*)    Glucose, UA >=500 (*)    Hgb urine dipstick MODERATE (*)    Protein, ur 100 (*)    Leukocytes,Ua LARGE (*)    Bacteria, UA RARE (*)    All other components within normal limits  CBG MONITORING, ED - Abnormal; Notable for the following components:   Glucose-Capillary 346 (*)    All other components  within normal limits  URINE CULTURE    EKG None  Radiology No results found.  Procedures Procedures   Medications Ordered in ED Medications  meclizine (ANTIVERT) tablet 12.5 mg (12.5 mg Oral Given 12/26/20 1406)  sodium chloride 0.9 % bolus 1,000 mL (0 mLs Intravenous Stopped 12/26/20 1634)    ED Course  I have reviewed the triage vital signs and the nursing notes.  Pertinent labs & imaging results that were  available during my care of the patient were reviewed by me and considered in my medical decision making (see chart for details).    MDM Rules/Calculators/A&P                            60 yo female with history as above to ED for dizziness, urinary urgency. Elevated glucose. Vital signs reviewed and are stable.  Neurologic exam is nonfocal.  Neurologic exam is consistent with peripheral vertigo.  Nontoxic-appearing.  Serious etiology considered  Patient given Antivert, this has aborted her vertiginous dizziness.  She is ambulatory without any symptoms. At baseline currently. Rpt neurologic exam is non-focal. Discussed preventative steps to take associated with her vertigo.  Close outpatient follow-up with ENT.  Patient with urinary urgency, found of urinary tract infection on urinalysis.  Urine culture sent.  Starting Keflex.  Close PCP follow-up.  Also advised patient to follow-up with her PCP regarding elevated glucose.  Remains elevated in the ER.  No evidence of DKA.  No HHS.  History of poorly controlled diabetes on insulin.  Patient presents with vertigo. On initial evaluation patient appears in no acute distress, afebrile with normal vital signs. Vertigo most suggestive of peripheral cause. Neuro intact without sign of CNS ischemia or other serious etiology. DC on Meclizine with close PCP F/U. Warnings discussed.   Patient in no distress and overall condition improved here in the ED. Detailed discussions were had with the patient regarding current findings, and need for close f/u with PCP or on call doctor. The patient has been instructed to return immediately if the symptoms worsen in any way for re-evaluation. Patient verbalized understanding and is in agreement with current care plan. All questions answered prior to discharge.   Final Clinical Impression(s) / ED Diagnoses Final diagnoses:  Lower urinary tract infectious disease  Vertigo  Hyperglycemia    Rx / DC Orders ED  Discharge Orders          Ordered    cephALEXin (KEFLEX) 500 MG capsule  2 times daily        12/26/20 1634    meclizine (ANTIVERT) 12.5 MG tablet  3 times daily PRN        12/26/20 1634             Jeanell Sparrow, DO 12/26/20 1639

## 2020-12-28 ENCOUNTER — Ambulatory Visit (INDEPENDENT_AMBULATORY_CARE_PROVIDER_SITE_OTHER): Payer: BC Managed Care – PPO | Admitting: Family Medicine

## 2020-12-28 ENCOUNTER — Other Ambulatory Visit: Payer: Self-pay | Admitting: Family Medicine

## 2020-12-28 ENCOUNTER — Telehealth: Payer: Self-pay | Admitting: Family Medicine

## 2020-12-28 ENCOUNTER — Telehealth: Payer: Self-pay

## 2020-12-28 ENCOUNTER — Encounter: Payer: Self-pay | Admitting: Family Medicine

## 2020-12-28 ENCOUNTER — Other Ambulatory Visit: Payer: Self-pay

## 2020-12-28 VITALS — BP 130/80 | HR 72 | Resp 16 | Ht 63.0 in | Wt 197.1 lb

## 2020-12-28 DIAGNOSIS — I10 Essential (primary) hypertension: Secondary | ICD-10-CM

## 2020-12-28 DIAGNOSIS — I679 Cerebrovascular disease, unspecified: Secondary | ICD-10-CM | POA: Diagnosis not present

## 2020-12-28 DIAGNOSIS — R42 Dizziness and giddiness: Secondary | ICD-10-CM | POA: Diagnosis not present

## 2020-12-28 DIAGNOSIS — J3089 Other allergic rhinitis: Secondary | ICD-10-CM | POA: Diagnosis not present

## 2020-12-28 DIAGNOSIS — E1142 Type 2 diabetes mellitus with diabetic polyneuropathy: Secondary | ICD-10-CM

## 2020-12-28 NOTE — Progress Notes (Addendum)
HPI: [name] is a 60 y.o. female, who is here today for ED follow up.   She was last seen on 08/19/20. Evaluated in the ED on 12/26/20. Spinning like sensation, intermittently for a while but she feels like it is getting worse. Exacerbated by movement and alleviated by being still. It lasts 1-2 min. No associated tinnitus,changes in hearing ,or nausea. Occasionally mild right parietal headache. She has had brain imaging because dizziness, 04/2020 and 10/2019.  Brain MRI in 04/2020: No acute infarction, hemorrhage, hydrocephalus, extra-axial collection or mass lesion. Small focus of susceptibility artifact in the right thalamus, likely related to chronic microhemorrhage. Scattered T2/FLAIR hyperintensities within the white matter, likely related to chronic microvascular ischemic disease.  Nasal congestion and rhinorrhea. She has used nasal steroid in the past. + Itchy eyes. No eye drainage or visual changes. Meclizine 25 mg and IVF in the ED. Discharged on Meclizine. She was referred to ENT, Dr Redmond Baseman.  Lab Results  Component Value Date   WBC 5.2 12/26/2020   HGB 13.5 12/26/2020   HCT 41.2 12/26/2020   MCV 79.2 (L) 12/26/2020   PLT 247 12/26/2020   States that she "did not know" she had an infection. She was started on Cephalexin 500 mg bid.. Denies dysuria,increased urinary frequency, gross hematuria,or decreased urine output.  She also would like to go through all her medications to be sure she is taking what she is supposed to. She is not sure why she is on Plavix, she is not taking it. Hx of CVA x 2. Hospitalization in 08/2010 because diplopia due to acute CVA. Recurrent cerebellar stoke, residual dizziness and unsteady gait.  She is on Atorvastatin 40 mg daily. Lab Results  Component Value Date   CHOL 240 (H) 10/09/2019   HDL 37.50 (L) 10/09/2019   LDLCALC 172 (H) 10/09/2019   LDLDIRECT 175.9 03/02/2012   TRIG 152.0 (H) 10/09/2019   CHOLHDL 6  10/09/2019   HTN: She is on Lisinopril-HCTZ 20-12.5 mg 2 tabs daily, Amlodipine 10 mg daily,,Metoprolol succinate 50 mg daily, and Hydralazine 25 mg tid. Negative for visual changes, chest pain, dyspnea, palpitation, claudication, focal weakness, or edema.  Lab Results  Component Value Date   CREATININE 1.09 (H) 12/26/2020   BUN 25 (H) 12/26/2020   NA 136 12/26/2020   K 3.9 12/26/2020   CL 101 12/26/2020   CO2 25 12/26/2020   She is not checking BP at home. DM II: Follows with endocrinologist. Medications were adjusted,oral meds discontinued. Fasting BS's 200's. Lab Results  Component Value Date   HGBA1C 10.7 (A) 10/20/2020   Review of Systems  Constitutional:  Positive for fatigue. Negative for activity change, appetite change and fever.  HENT:  Positive for postnasal drip. Negative for mouth sores, nosebleeds and sore throat.   Respiratory:  Negative for cough and wheezing.   Gastrointestinal:  Negative for abdominal pain and vomiting.       Negative for changes in bowel habits.  Musculoskeletal:  Negative for joint swelling and myalgias.  Skin:  Negative for pallor and rash.  Allergic/Immunologic: Positive for environmental allergies.  Neurological:  Negative for syncope, facial asymmetry and weakness.  Hematological:  Negative for adenopathy. Does not bruise/bleed easily.  Psychiatric/Behavioral:  Negative for confusion. The patient is nervous/anxious.   Rest of ROS, see pertinent positives sand negatives in HPI  Current Outpatient Medications on File Prior to Visit  Medication Sig Dispense Refill   acetaminophen (TYLENOL) 500 MG tablet Take 2 tablets (  1,000 mg total) by mouth every 6 (six) hours as needed. 30 tablet 0   amLODipine (NORVASC) 10 MG tablet Take 1 tablet by mouth once daily 30 tablet 3   Blood Pressure Monitoring (BLOOD PRESSURE MONITOR AUTOMAT) DEVI 1 Device by Does not apply route daily. 1 Device 0   cephALEXin (KEFLEX) 500 MG capsule Take 1 capsule (500  mg total) by mouth 2 (two) times daily for 7 days. 14 capsule 0   clopidogrel (PLAVIX) 75 MG tablet Take 1 tablet (75 mg total) by mouth daily. 90 tablet 3   famotidine (PEPCID) 20 MG tablet Take 1 tablet by mouth twice daily for 14 days 28 tablet 0   FLUoxetine (PROZAC) 20 MG capsule Take 1 capsule (20 mg total) by mouth daily. 90 capsule 2   glucose blood (CONTOUR NEXT TEST) test strip 1 each by Other route 2 (two) times daily. And lancets 2/day 200 each 3   glucose blood (ONETOUCH VERIO) test strip USE TO CHECK BLOOD SUGAR TWICE A DAY AND PRN 100 each 6   hydrALAZINE (APRESOLINE) 25 MG tablet Take 1 tablet (25 mg total) by mouth 3 (three) times daily. (Patient taking differently: Take 25 mg by mouth daily.) 90 tablet 2   insulin glargine (LANTUS) 100 UNIT/ML Solostar Pen Inject 130 Units into the skin every morning. And pen needles 1/day 135 mL 3   lisinopril-hydrochlorothiazide (ZESTORETIC) 20-12.5 MG tablet Take 2 tablets by mouth once daily 60 tablet 3   meclizine (ANTIVERT) 12.5 MG tablet Take 1 tablet (12.5 mg total) by mouth 3 (three) times daily as needed for dizziness. 15 tablet 0   metoprolol succinate (TOPROL-XL) 50 MG 24 hr tablet TAKE 1 TABLET BY MOUTH ONCE DAILY WITH  OR  IMMEDIATELY  FOLLOWING  A  MEAL 30 tablet 0   ONE TOUCH LANCETS MISC USE TO CHECK BLOOD SUGAR TWICE A DAY AND PRN 100 each 6   SUPREP BOWEL PREP KIT 17.5-3.13-1.6 GM/177ML SOLN Take 354 mLs by mouth once. Take the night before and the morning of colonoscopy.     triamcinolone ointment (KENALOG) 0.5 % Apply topically 2 (two) times daily as needed. 45 g 1   No current facility-administered medications on file prior to visit.   Past Medical History:  Diagnosis Date   Abnormality of gait 05/10/2010   BACK PAIN 11/14/2008   CEREBROVASCULAR DISEASE 08/05/2008   DIABETES MELLITUS, TYPE II 07/15/2008   Diplopia 07/15/2008   ECZEMA, ATOPIC 04/03/2009   Guillain-Barre ([place])    HYPERLIPIDEMIA 03/06/2009   HYPERTENSION  07/15/2008   Stroke ([place]) 2010, 2011   x2    Vertebral artery stenosis    No Known Allergies  Social History   Socioeconomic History   Marital status: Single    Spouse name: Not on file   Number of children: Not on file   Years of education: Not on file   Highest education level: Not on file  Occupational History   Not on file  Tobacco Use   Smoking status: Never   Smokeless tobacco: Never  Vaping Use   Vaping Use: Never used  Substance and Sexual Activity   Alcohol use: No   Drug use: No   Sexual activity: Not on file  Other Topics Concern   Not on file  Social History Narrative   Not on file   Social Determinants of Health   Financial Resource Strain: Not on file  Food Insecurity: Not on file  Transportation Needs: Not on file  Physical Activity: Not on file  Stress: Not on file  Social Connections: Not on file   Vitals:   12/28/20 0658  BP: 130/80  Pulse: 72  Resp: 16  SpO2: 98%   Body mass index is 34.91 kg/m.  Physical Exam Vitals and nursing note reviewed.  Constitutional:      General: She is not in acute distress.    Appearance: She is well-developed.  HENT:     Head: Normocephalic and atraumatic.     Nose: Septal deviation present.     Right Turbinates: Not enlarged.     Left Turbinates: Not enlarged.     Mouth/Throat:     Mouth: Mucous membranes are moist.     Pharynx: Oropharynx is clear.  Eyes:     Conjunctiva/sclera: Conjunctivae normal.  Neck:     Vascular: No carotid bruit.     Comments: No dizziness elicited with movement on examination table. Cardiovascular:     Rate and Rhythm: Normal rate and regular rhythm.     Pulses:          Dorsalis pedis pulses are 2+ on the right side and 2+ on the left side.     Heart sounds: No murmur heard. Pulmonary:     Effort: Pulmonary effort is normal. No respiratory distress.     Breath sounds: Normal breath sounds.  Abdominal:     Palpations: Abdomen is soft. There is no hepatomegaly or  mass.     Tenderness: There is no abdominal tenderness.  Lymphadenopathy:     Cervical: No cervical adenopathy.  Skin:    General: Skin is warm.     Findings: No erythema or rash.  Neurological:     General: No focal deficit present.     Mental Status: She is alert and oriented to person, place, and time.     Cranial Nerves: No cranial nerve deficit.     Comments: Otherwise stable gait, not assisted.  Psychiatric:     Comments: Well groomed, good eye contact.   ASSESSMENT AND PLAN:  [name] was seen today for for ER follow-up.  Diagnoses and all orders for this visit:  Vertigo Problem has been going on for years. We discussed other possible etiologies of dizziness, Hx suggests benign vertigo, not elicited today on examination. Hx of cerebellar CVA, which can also be a contributing factor for dizziness as well as medications and poorly controlled DM 2.  I do not think further work-up is necessary at this time. She is going to call Dr Redmond Baseman' office to arrange appt. Explained that problem can be recurrent. Fall prevention, recommend avoiding activities that may increase risk of falls and/or injuries. Form for work accommodations can be completed if needed.  Vestibular exercises recommended while she is waiting for ENT evaluation, handout with [place] maneuvers given. Meclizine 25 mg bid prn, some side effects discussed. Instructed about warning signs. F/U as needed.  Essential hypertension BP adequately controlled. Continue current management: Lisinopril-HCTZ 20-12.5 mg 2 tabs daily, Amlodipine 10 mg daily,,Metoprolol succinate 50 mg daily, and Hydralazine 25 mg tid. DASH/low salt diet recommended. Monitor BP at home. Eye exam recommended annually.  Cerebrovascular disease We discussed indications for Plavix as well as some side effects. Recommend resuming medication and to hold 7 days before colonoscopy. Continue Atorvastatin 40 mg daily. LDL is not at  goal. She is not fasting today, will check FLP next visit.  Non-seasonal allergic rhinitis, unspecified trigger Mild symptoms at this time. Nasal saline irrigations  as needed. Follow with ENT.  I spent a total of 42 minutes in both face to face and non face to face activities for this visit on the date of this encounter. During this time history was obtained and documented, examination was performed, prior labs/imaging reviewed, and assessment/plan discussed.  Return in about 4 months (around 04/29/2021).  Flynt Breeze G. [name], MD  [facility]. [place] office.

## 2020-12-28 NOTE — Telephone Encounter (Signed)
Pt call and stated the referral dr [name] put in need to be put in again before pt can make her appt .

## 2020-12-28 NOTE — Telephone Encounter (Signed)
Okay to refill meclizine? Does the keflex need to be repeated?

## 2020-12-28 NOTE — Telephone Encounter (Signed)
Patient called requesting refill for  meclizine (ANTIVERT) 12.5 MG tablet  cephALEXin (KEFLEX) 500 MG capsule Pt stated she was only given a 7 day supply and is about to run out

## 2020-12-28 NOTE — Patient Instructions (Addendum)
A few things to remember from today's visit:   Essential hypertension  Vertigo  Cerebrovascular disease  Dizziness is a perception of movement, it is sometimes difficult to describe and can be  caused by different problems, most benign but others can be life threaten.  Vertigo is the most common cause of dizziness, usually related with inner ear and can be associated with nausea, vomiting, and unbalance sensation. It can be complicated by falls due to lose of balance; so fall precautions are very important.  Most of the time dizziness is benign, usually intermittent, last a few seconds at the time and aggravated by certain positions. It usually resolves in a few weeks without residual effect but it could be recurrent.  Sometimes blood work is ordered to evaluate for other possible causes.  Dizziness can also be caused by certain medications, dehydration, migraines, and strokes.  Medication prescribed for vertigo, Meclizine, causes drowsiness/sleepiness, so frequently I recommended taking it at bedtime. I also recommend what we called vestibular exercise, sometimes can be done at home (Modified Semont maneuvers) other times I refer patients to vestibular rehabilitation. Please contact Dr Redmond Baseman' office.  Seek immediate medical attention if: New severe headache, dobble vision, fever (100 F or more), associated numbness/tingling, focal weakness, persistent vomiting, not able to walk, or sudden worsening symptoms.  You are on Plavix because history of strokes. Hold med 7 days before colonoscopy.  If you need refills please call your pharmacy. Do not use My Chart to request refills or for acute issues that need immediate attention.   Also call cardiologist's office.  Please be sure medication list is accurate. If a new problem present, please set up appointment sooner than planned today.

## 2020-12-29 MED ORDER — MECLIZINE HCL 12.5 MG PO TABS: 12.5000 mg | ORAL_TABLET | Freq: Two times a day (BID) | ORAL | 0 refills | Status: DC

## 2020-12-29 NOTE — Telephone Encounter (Signed)
I called and left patient a voicemail letting her know that her Lantus is here to be picked up. I also advised that we refilled the medication for the dizziness, but she does not need to continue the antibiotic past the 7 days.

## 2020-12-29 NOTE — Addendum Note (Signed)
Addended by: Rodrigo Ran on: 12/29/2020 02:15 PM   Modules accepted: Orders

## 2020-12-30 ENCOUNTER — Telehealth: Payer: Self-pay | Admitting: Family Medicine

## 2020-12-30 NOTE — Telephone Encounter (Signed)
I left patient a voicemail letting her know that her paperwork is completed and ready for pick up at the front.  She also has Lantus in the gray fridge in the front pod ready for pick up, it is in a brown bag on the bottom shelf.

## 2020-12-30 NOTE — Telephone Encounter (Signed)
Paperwork filled out & placed in pcp's bin to be signed.

## 2020-12-30 NOTE — Telephone Encounter (Signed)
Paperwork received.

## 2020-12-30 NOTE — Telephone Encounter (Signed)
FMLA forms to be filled out--placed in dr's folder.  Call [phone] upon completion.

## 2020-12-30 NOTE — Telephone Encounter (Signed)
error 

## 2021-01-13 NOTE — Telephone Encounter (Signed)
I called and spoke with patient, advised her that the new FMLA form is completed & ready for pick up.

## 2021-01-20 ENCOUNTER — Telehealth: Payer: Self-pay | Admitting: Family Medicine

## 2021-01-20 NOTE — Telephone Encounter (Signed)
Form is filled out, in pcp's bin for signature.

## 2021-01-20 NOTE — Telephone Encounter (Signed)
Patient dropped off paperwork that she would like Dr. [name] to complete.  Patient would like a call at [phone] once paperwork is completed.  Paperwork will be placed in folder.  Please advise.

## 2021-01-22 NOTE — Telephone Encounter (Signed)
PT called to advise that they need added on to the form that she had a stroke. She saw one of her Drs that thinks she may of had a stroke caused by the stress from her job. Please advise.

## 2021-01-22 NOTE — Telephone Encounter (Signed)
Paperwork is completed, copy sent to scan. Forms placed up front for patient to pick up. Patient is aware.

## 2021-01-22 NOTE — Telephone Encounter (Signed)
Added onto pt's form that she has a history of strokes & an mri has been ordered by Dr. Redmond Baseman to see if another has occurred.

## 2021-02-04 ENCOUNTER — Telehealth: Payer: Self-pay | Admitting: Family Medicine

## 2021-02-04 ENCOUNTER — Other Ambulatory Visit: Payer: Self-pay | Admitting: Family Medicine

## 2021-02-04 NOTE — Telephone Encounter (Signed)
Patient called today asking if the results from her MRI from yesterday were received by Dr. [name] and her team.  Informed patient that Dr. [name] and team were out of the office today but a message could be sent back and someone could give her a call.  Patient is wanting to know next steps based off MRI results.  Please advise.

## 2021-02-05 NOTE — Telephone Encounter (Signed)
We have not received these results yet, MRI was ordered by Dr. Redmond Baseman - pt's ENT. Will keep an eye out for results.

## 2021-02-06 ENCOUNTER — Emergency Department (HOSPITAL_COMMUNITY)
Admission: EM | Admit: 2021-02-06 | Discharge: 2021-02-06 | Disposition: A | Discharge status: Home / Self Care | Payer: BC Managed Care – PPO | Source: Home / Self Care | Attending: Emergency Medicine | Admitting: Emergency Medicine

## 2021-02-06 ENCOUNTER — Encounter (HOSPITAL_BASED_OUTPATIENT_CLINIC_OR_DEPARTMENT_OTHER): Payer: Self-pay | Admitting: *Deleted

## 2021-02-06 ENCOUNTER — Other Ambulatory Visit: Payer: Self-pay

## 2021-02-06 ENCOUNTER — Encounter (HOSPITAL_COMMUNITY): Payer: Self-pay

## 2021-02-06 ENCOUNTER — Observation Stay (HOSPITAL_BASED_OUTPATIENT_CLINIC_OR_DEPARTMENT_OTHER)
Admission: EM | Admit: 2021-02-06 | Discharge: 2021-02-08 | Disposition: A | Discharge status: Home / Self Care | Payer: BC Managed Care – PPO | Attending: Internal Medicine | Admitting: Internal Medicine

## 2021-02-06 DIAGNOSIS — M7989 Other specified soft tissue disorders: Secondary | ICD-10-CM | POA: Insufficient documentation

## 2021-02-06 DIAGNOSIS — E6609 Other obesity due to excess calories: Secondary | ICD-10-CM

## 2021-02-06 DIAGNOSIS — I639 Cerebral infarction, unspecified: Principal | ICD-10-CM | POA: Insufficient documentation

## 2021-02-06 DIAGNOSIS — E1169 Type 2 diabetes mellitus with other specified complication: Secondary | ICD-10-CM | POA: Diagnosis present

## 2021-02-06 DIAGNOSIS — R739 Hyperglycemia, unspecified: Secondary | ICD-10-CM | POA: Insufficient documentation

## 2021-02-06 DIAGNOSIS — I10 Essential (primary) hypertension: Secondary | ICD-10-CM | POA: Diagnosis present

## 2021-02-06 DIAGNOSIS — E1151 Type 2 diabetes mellitus with diabetic peripheral angiopathy without gangrene: Secondary | ICD-10-CM | POA: Diagnosis present

## 2021-02-06 DIAGNOSIS — E785 Hyperlipidemia, unspecified: Secondary | ICD-10-CM | POA: Diagnosis present

## 2021-02-06 DIAGNOSIS — Z20822 Contact with and (suspected) exposure to covid-19: Secondary | ICD-10-CM | POA: Insufficient documentation

## 2021-02-06 DIAGNOSIS — R42 Dizziness and giddiness: Secondary | ICD-10-CM

## 2021-02-06 DIAGNOSIS — Z6831 Body mass index (BMI) 31.0-31.9, adult: Secondary | ICD-10-CM

## 2021-02-06 DIAGNOSIS — C8300 Small cell B-cell lymphoma, unspecified site: Secondary | ICD-10-CM | POA: Diagnosis present

## 2021-02-06 DIAGNOSIS — Z794 Long term (current) use of insulin: Secondary | ICD-10-CM | POA: Insufficient documentation

## 2021-02-06 DIAGNOSIS — R202 Paresthesia of skin: Secondary | ICD-10-CM | POA: Diagnosis present

## 2021-02-06 DIAGNOSIS — Z79899 Other long term (current) drug therapy: Secondary | ICD-10-CM | POA: Insufficient documentation

## 2021-02-06 DIAGNOSIS — E1142 Type 2 diabetes mellitus with diabetic polyneuropathy: Secondary | ICD-10-CM | POA: Diagnosis present

## 2021-02-06 DIAGNOSIS — I6330 Cerebral infarction due to thrombosis of unspecified cerebral artery: Secondary | ICD-10-CM | POA: Insufficient documentation

## 2021-02-06 DIAGNOSIS — R930 Abnormal findings on diagnostic imaging of skull and head, not elsewhere classified: Secondary | ICD-10-CM | POA: Diagnosis present

## 2021-02-06 DIAGNOSIS — Z7902 Long term (current) use of antithrombotics/antiplatelets: Secondary | ICD-10-CM | POA: Diagnosis not present

## 2021-02-06 DIAGNOSIS — E119 Type 2 diabetes mellitus without complications: Secondary | ICD-10-CM | POA: Diagnosis not present

## 2021-02-06 DIAGNOSIS — E1149 Type 2 diabetes mellitus with other diabetic neurological complication: Secondary | ICD-10-CM

## 2021-02-06 DIAGNOSIS — Z5321 Procedure and treatment not carried out due to patient leaving prior to being seen by health care provider: Secondary | ICD-10-CM | POA: Insufficient documentation

## 2021-02-06 DIAGNOSIS — E66811 Obesity, class 1: Secondary | ICD-10-CM

## 2021-02-06 LAB — CBC WITH DIFFERENTIAL/PLATELET
Abs Immature Granulocytes: 0.02 10*3/uL (ref 0.00–0.07)
Basophils Absolute: 0 10*3/uL (ref 0.0–0.1)
Basophils Relative: 1 %
Eosinophils Absolute: 0.1 10*3/uL (ref 0.0–0.5)
Eosinophils Relative: 2 %
HCT: 38.7 % (ref 36.0–46.0)
Hemoglobin: 13 g/dL (ref 12.0–15.0)
Immature Granulocytes: 0 %
Lymphocytes Relative: 38 %
Lymphs Abs: 2.3 10*3/uL (ref 0.7–4.0)
MCH: 26.1 pg (ref 26.0–34.0)
MCHC: 33.6 g/dL (ref 30.0–36.0)
MCV: 77.6 fL — ABNORMAL LOW (ref 80.0–100.0)
Monocytes Absolute: 0.7 10*3/uL (ref 0.1–1.0)
Monocytes Relative: 12 %
Neutro Abs: 2.9 10*3/uL (ref 1.7–7.7)
Neutrophils Relative %: 47 %
Platelets: 257 10*3/uL (ref 150–400)
RBC: 4.99 MIL/uL (ref 3.87–5.11)
RDW: 13.9 % (ref 11.5–15.5)
WBC: 6.1 10*3/uL (ref 4.0–10.5)
nRBC: 0 % (ref 0.0–0.2)

## 2021-02-06 LAB — CBG MONITORING, ED: Glucose-Capillary: 286 mg/dL — ABNORMAL HIGH (ref 70–99)

## 2021-02-06 LAB — URINALYSIS, ROUTINE W REFLEX MICROSCOPIC
Bilirubin Urine: NEGATIVE
Glucose, UA: >=500 mg/dL — AB
Ketones, ur: NEGATIVE mg/dL
Nitrite: NEGATIVE
Protein, ur: >=300 mg/dL — AB
Specific Gravity, Urine: 1.013 (ref 1.005–1.030)
WBC, UA: >50 WBC/hpf — ABNORMAL HIGH (ref 0–5)
pH: 5 (ref 5.0–8.0)

## 2021-02-06 LAB — BASIC METABOLIC PANEL
Anion gap: 7 (ref 5–15)
BUN: 21 mg/dL (ref 8–23)
CO2: 25 mmol/L (ref 22–32)
Calcium: 9.9 mg/dL (ref 8.9–10.3)
Chloride: 101 mmol/L (ref 98–111)
Creatinine, Ser: 0.87 mg/dL (ref 0.44–1.00)
GFR, Estimated: >60 mL/min (ref >60)
Glucose, Bld: 291 mg/dL — ABNORMAL HIGH (ref 70–99)
Potassium: 4.1 mmol/L (ref 3.5–5.1)
Sodium: 133 mmol/L — ABNORMAL LOW (ref 135–145)

## 2021-02-06 NOTE — ED Triage Notes (Signed)
Patient c/o bilateral swelling from her knees to the feet x 2 weeks.

## 2021-02-06 NOTE — ED Provider Notes (Signed)
Emergency Medicine Provider Triage Evaluation Note  [name] , a 60 y.o. female  was evaluated in triage.  Pt complains of leg discomfort.  Review of Systems  Positive: Decreased sensations to both legs, elevated CBG Negative: Fever, swelling, injury, cp, sob  Physical Exam  BP (!) 165/86 (BP Location: Left Arm)   Pulse 78   Temp 98.2 F (36.8 C) (Oral)   Resp 18   Ht 5' 4.5" (1.638 m)   Wt 85.7 kg   SpO2 98%   BMI 31.94 kg/m  Gen:   Awake, no distress   Resp:  Normal effort  MSK:   Moves extremities without difficulty  Other:  No significant edema to BLE.  Intact DP pulses  Medical Decision Making  Medically screening exam initiated at 1:17 PM.  Appropriate orders placed.  Aarvi Stotts was informed that the remainder of the evaluation will be completed by another provider, this initial triage assessment does not replace that evaluation, and the importance of remaining in the ED until their evaluation is complete.  Pt with hx of DM report feeling tingling decreased sensation in the socks distribution to both legs.  Sts CBG was in the 300s today.  No hx of CHF or DVT.sxs ongoing x 2 weeks.    Domenic Moras, PA-C 02/06/21 1322    Horton, Alvin Critchley, DO 02/06/21 [phone]

## 2021-02-06 NOTE — ED Triage Notes (Signed)
Pt seen at [facility] earlier today for same c/o legs swollen x 2 weeks. She had lab work done but left due to wait

## 2021-02-06 NOTE — ED Provider Notes (Signed)
[facility] EM[facility]ENCY DEPARTMENT Provider Note   CSN: [id number] Arrival date & time: 02/06/21  2044     History Chief Complaint  Patient presents with   Leg Pain    [name] is a 60 y.o. female.  Patient with history of diabetes, previous stroke, hyperlipidemia, hypertension presenting with bilateral paresthesias to her legs for the past 2 weeks.  Reports feeling abnormal sensation in her legs with "stocking feeling" to her bilateral legs with pins-and-needles sensation and numbness and tingling.  Denies any pain or weakness.  Denies any back pain.  Denies any chest pain or shortness of breath.  Denies any abdominal pain, nausea or vomiting. She was seen at [facility] earlier today but left due to the wait. She had an MRI several days ago by her ear nose and throat doctor for dizziness.  She was told there were 2 small strokes and she is supposed to see a neurologist.  Has had a stroke in the past and is currently on Plavix.  The history is provided by the patient.  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   Trigger finger, left ring finger 10/04/2018   Alternating constipation and diarrhea 04/20/2018   Diabetic peripheral neuropathy associated with type 2 diabetes mellitus ([place]) 02/26/2018   DM (diabetes mellitus), type 2 with peripheral vascular complications ([place]) 45/85/9292   ABNORMALITY OF GAIT 05/10/2010   ECZEMA, ATOPIC 04/03/2009   Hyperlipidemia associated with type 2 diabetes mellitus ([place])  03/06/2009   BACK PAIN 11/14/2008   Cerebrovascular disease 08/05/2008   DIPLOPIA 07/15/2008   Essential hypertension 07/15/2008    Past Surgical History:  Procedure Laterality Date   ABDOMINAL HYSTERECTOMY     DILATION AND CURETTAGE OF UTERUS     FOOT SURGERY       OB History   No obstetric history on file.     Family History  Problem Relation Age of Onset   Diabetes Sister    Asthma Other    Stroke Other    Hypertension Other    Diabetes Mother    Stroke Mother    Cancer Father        pt states hae had some kind of stomach cancer, ? stomach or colon   Breast cancer Neg Hx     Social History   Tobacco Use   Smoking status: Never   Smokeless tobacco: Never  Vaping Use   Vaping Use: Never used  Substance Use Topics   Alcohol use: No   Drug use: No    Home Medications Prior to Admission medications   Medication Sig Start Date End Date Taking? Authorizing Provider  acetaminophen (TYLENOL) 500 MG tablet Take 2 tablets (1,000 mg total) by mouth every 6 (six) hours as needed. 11/19/19   [name], MD  amLODipine (NORVASC) 10 MG tablet Take 1 tablet by mouth once daily 11/09/20   [name], [name], MD  atorvastatin (LIPITOR) 40 MG tablet Take 1 tablet by  mouth once daily 12/28/20   [name], [name], MD  Blood Pressure Monitoring (BLOOD PRESSURE MONITOR AUTOMAT) DEVI 1 Device by Does not apply route daily. 08/29/18   [name], [name], MD  clopidogrel (PLAVIX) 75 MG tablet Take 1 tablet (75 mg total) by mouth daily. 11/10/20   [name], [name], MD  famotidine (PEPCID) 20 MG tablet Take 1 tablet by mouth twice daily for 14 days 04/06/20   [name], [name], MD  FLUoxetine (PROZAC) 20 MG capsule Take 1 capsule (20 mg total) by mouth daily. 07/17/20   [name], [name], MD  glucose blood (CONTOUR NEXT TEST) test strip 1 each by Other route 2 (two) times daily. And lancets 2/day 03/09/18   [name], MD  glucose blood [facility] VERIO) test strip USE TO CHECK BLOOD SUGAR TWICE A DAY  AND PRN 07/29/15   [name], MD  hydrALAZINE (APRESOLINE) 25 MG tablet Take 1 tablet (25 mg total) by mouth 3 (three) times daily. Patient taking differently: Take 25 mg by mouth daily. 08/19/20   [name], [name], MD  insulin glargine (LANTUS) 100 UNIT/ML Solostar Pen Inject 130 Units into the skin every morning. And pen needles 1/day 10/20/20   [name], MD  lisinopril-hydrochlorothiazide (ZESTORETIC) 20-12.5 MG tablet Take 2 tablets by mouth once daily 11/09/20   [name], [name], MD  meclizine (ANTIVERT) 12.5 MG tablet Take 1 tablet (12.5 mg total) by mouth every 12 (twelve) hours. 12/29/20   [name], [name], MD  metoprolol succinate (TOPROL-XL) 50 MG 24 hr tablet TAKE 1 TABLET BY MOUTH ONCE DAILY WITH  OR  IMMEDIATELY  FOLLOWING  A  MEAL 10/06/20   [name], [name], MD  omeprazole (PRILOSEC) 20 MG capsule Take 1 capsule by mouth once daily 12/28/20   [name], [name], MD  ONE TOUCH LANCETS MISC USE TO CHECK BLOOD SUGAR TWICE A DAY AND PRN 07/29/15   [name], MD  SUPREP BOWEL PREP KIT 17.5-3.13-1.6 GM/177ML SOLN Take 354 mLs by mouth once. Take the night before and the morning of colonoscopy. 12/23/20   [provider]  triamcinolone ointment (KENALOG) 0.5 % Apply topically 2 (two) times daily as needed. 10/23/19   [name], [name], MD    Allergies    Patient has no known allergies.  Review of Systems   Review of Systems  Constitutional:  Negative for activity change, appetite change and fever.  HENT:  Negative for congestion and rhinorrhea.   Respiratory:  Negative for cough, chest tightness and shortness of breath.   Cardiovascular:  Negative for chest pain.  Gastrointestinal:  Negative for abdominal pain, nausea and vomiting.  Genitourinary:  Negative for dysuria and hematuria.  Musculoskeletal:  Negative for arthralgias and myalgias.  Skin:  Negative for rash.  Neurological:  Positive for numbness. Negative for dizziness, weakness, light-headedness and  headaches.   all other systems are negative except as noted in the HPI and PMH.   Physical Exam Updated Vital Signs BP (!) 159/88   Pulse 63   Temp 98.1 F (36.7 C) (Oral)   Resp 18   Ht 5' 4.5" (1.638 m)   Wt 84.1 kg   SpO2 97%   BMI 31.35 kg/m   Physical Exam Vitals and nursing note reviewed.  Constitutional:      General: She is not in acute distress.    Appearance: She is well-developed.  HENT:     Head: Normocephalic and atraumatic.     Mouth/Throat:  Pharynx: No oropharyngeal exudate.  Eyes:     Conjunctiva/sclera: Conjunctivae normal.     Pupils: Pupils are equal, round, and reactive to light.  Neck:     Comments: No meningismus. Cardiovascular:     Rate and Rhythm: Normal rate and regular rhythm.     Heart sounds: Normal heart sounds. No murmur heard. Pulmonary:     Effort: Pulmonary effort is normal. No respiratory distress.     Breath sounds: Normal breath sounds.  Abdominal:     Palpations: Abdomen is soft.     Tenderness: There is no abdominal tenderness. There is no guarding or rebound.  Musculoskeletal:        General: No tenderness. Normal range of motion.     Cervical back: Normal range of motion and neck supple.     Right lower leg: No edema.     Left lower leg: No edema.     Comments: Legs normal to inspection.  Compartments are soft.  No erythema.  Intact DP and PT pulses.  5/5 strength in bilateral lower extremities. Ankle plantar and dorsiflexion intact. Great toe extension intact bilaterally. +2 DP and PT pulses. +2 patellar reflexes bilaterally. Normal gait.   Skin:    General: Skin is warm.  Neurological:     Mental Status: She is alert and oriented to person, place, and time.     Cranial Nerves: No cranial nerve deficit.     Motor: No abnormal muscle tone.     Coordination: Coordination normal.     Comments:  5/5 strength throughout. CN 2-12 intact.Equal grip strength.   Psychiatric:        Behavior: Behavior normal.    ED  Results / Procedures / Treatments   Labs (all labs ordered are listed, but only abnormal results are displayed) Labs Reviewed  BASIC METABOLIC PANEL - Abnormal; Notable for the following components:      Result Value   Glucose, Bld 164 (*)    Creatinine, Ser 1.11 (*)    GFR, Estimated 57 (*)    All other components within normal limits  RESP PANEL BY RT-PCR (FLU A&B, COVID) ARPGX2  CK    EKG None  Radiology No results found.  Procedures Procedures   Medications Ordered in ED Medications - No data to display  ED Course  I have reviewed the triage vital signs and the nursing notes.  Pertinent labs & imaging results that were available during my care of the patient were reviewed by me and considered in my medical decision making (see chart for details).    MDM Rules/Calculators/A&P                           Bilateral lower leg discomfort for several weeks.  Neurovascularly intact.  Suspect likely diabetic neuropathy. Low suspicion for DVT, low suspicion for CHF.  No significant edema.  Pulses are intact. Will initiate gabapentin. Electrolytes and CK are normal.  Patient did have MRI on 02/03/21: IMPRESSION:  1. Punctate focus of restricted diffusion in the inferior left pons,  without associated enhancement, most likely an acute infarct.  2. Additional focus of restricted diffusion with enhancement in the  more superior left pons, most likely a subacute infarct.   Discussed with Dr. Cheral Marker of neurology.  He states patient has appeared to fail Plavix and will benefit from starting aspirin as well. Discussed that she could be admitted tonight for stroke work-up versus follow-up as an outpatient.  He encourages her to have close follow-up as stroke is more likely in the recent days after a stroke  Discussed with patient.  She states her doctor is supposed to be referring her to neurology but she has not heard anything yet.  She was given the option of outpatient versus  inpatient neurology evaluation and stroke work-up.  She elects to stay in [place] for neurology evaluation.  Will initiate aspirin in addition to Plavix per Dr. Yvetta Coder recommendations.  Admission discussed with Dr. Tonie Griffith.   Final Clinical Impression(s) / ED Diagnoses Final diagnoses:  Cerebrovascular accident (CVA), unspecified mechanism ([place])  Other diabetic neurological complication associated with type 2 diabetes mellitus [facility])    Rx / [place] Orders ED Discharge Orders     None        Dudley Cooley, Annie Main, MD 02/07/21 [phone]

## 2021-02-07 ENCOUNTER — Observation Stay (HOSPITAL_COMMUNITY): Payer: BC Managed Care – PPO

## 2021-02-07 ENCOUNTER — Encounter (HOSPITAL_COMMUNITY): Payer: Self-pay | Admitting: Internal Medicine

## 2021-02-07 DIAGNOSIS — I639 Cerebral infarction, unspecified: Secondary | ICD-10-CM | POA: Diagnosis not present

## 2021-02-07 DIAGNOSIS — Z79899 Other long term (current) drug therapy: Secondary | ICD-10-CM | POA: Diagnosis not present

## 2021-02-07 DIAGNOSIS — E119 Type 2 diabetes mellitus without complications: Secondary | ICD-10-CM | POA: Diagnosis not present

## 2021-02-07 DIAGNOSIS — Z7902 Long term (current) use of antithrombotics/antiplatelets: Secondary | ICD-10-CM | POA: Diagnosis not present

## 2021-02-07 DIAGNOSIS — Z794 Long term (current) use of insulin: Secondary | ICD-10-CM | POA: Diagnosis not present

## 2021-02-07 DIAGNOSIS — R202 Paresthesia of skin: Secondary | ICD-10-CM | POA: Diagnosis present

## 2021-02-07 DIAGNOSIS — R42 Dizziness and giddiness: Secondary | ICD-10-CM

## 2021-02-07 DIAGNOSIS — R930 Abnormal findings on diagnostic imaging of skull and head, not elsewhere classified: Secondary | ICD-10-CM | POA: Diagnosis present

## 2021-02-07 DIAGNOSIS — I10 Essential (primary) hypertension: Secondary | ICD-10-CM | POA: Diagnosis not present

## 2021-02-07 DIAGNOSIS — Z6831 Body mass index (BMI) 31.0-31.9, adult: Secondary | ICD-10-CM

## 2021-02-07 DIAGNOSIS — Z20822 Contact with and (suspected) exposure to covid-19: Secondary | ICD-10-CM | POA: Diagnosis not present

## 2021-02-07 DIAGNOSIS — E6609 Other obesity due to excess calories: Secondary | ICD-10-CM

## 2021-02-07 HISTORY — DX: Other obesity due to excess calories: Z68.31

## 2021-02-07 HISTORY — DX: Other obesity due to excess calories: E66.09

## 2021-02-07 LAB — BASIC METABOLIC PANEL
Anion gap: 8 (ref 5–15)
BUN: 23 mg/dL (ref 8–23)
CO2: 24 mmol/L (ref 22–32)
Calcium: 9.9 mg/dL (ref 8.9–10.3)
Chloride: 103 mmol/L (ref 98–111)
Creatinine, Ser: 1.11 mg/dL — ABNORMAL HIGH (ref 0.44–1.00)
GFR, Estimated: 57 mL/min — ABNORMAL LOW (ref >60)
Glucose, Bld: 164 mg/dL — ABNORMAL HIGH (ref 70–99)
Potassium: 3.8 mmol/L (ref 3.5–5.1)
Sodium: 135 mmol/L (ref 135–145)

## 2021-02-07 LAB — RESP PANEL BY RT-PCR (FLU A&B, COVID) ARPGX2
Influenza A by PCR: NEGATIVE
Influenza B by PCR: NEGATIVE
SARS Coronavirus 2 by RT PCR: NEGATIVE

## 2021-02-07 LAB — GLUCOSE, CAPILLARY: Glucose-Capillary: 370 mg/dL — ABNORMAL HIGH (ref 70–99)

## 2021-02-07 LAB — CK: Total CK: 79 U/L (ref 38–234)

## 2021-02-07 MED ORDER — INSULIN GLARGINE-YFGN 100 UNIT/ML ~~LOC~~ SOLN
130.0000 [IU] | Freq: Every day | SUBCUTANEOUS | Status: DC
  Administered 2021-02-08: 130 [IU] via SUBCUTANEOUS
  Filled 2021-02-07 (×2): qty 1.3

## 2021-02-07 MED ORDER — HYDROCHLOROTHIAZIDE 25 MG PO TABS
25.0000 mg | ORAL_TABLET | Freq: Every day | ORAL | Status: DC
  Filled 2021-02-07: qty 1

## 2021-02-07 MED ORDER — ACETAMINOPHEN 650 MG RE SUPP: 650.0000 mg | RECTAL | Status: DC | PRN

## 2021-02-07 MED ORDER — INSULIN ASPART 100 UNIT/ML IJ SOLN
0.0000 [IU] | Freq: Three times a day (TID) | INTRAMUSCULAR | Status: DC
  Administered 2021-02-07: 15 [IU] via SUBCUTANEOUS
  Administered 2021-02-08 (×3): 3 [IU] via SUBCUTANEOUS

## 2021-02-07 MED ORDER — SENNOSIDES-DOCUSATE SODIUM 8.6-50 MG PO TABS: 1.0000 | ORAL_TABLET | Freq: Every evening | ORAL | Status: DC | PRN

## 2021-02-07 MED ORDER — GABAPENTIN 300 MG PO CAPS
300.0000 mg | ORAL_CAPSULE | Freq: Once | ORAL | Status: AC
  Administered 2021-02-07: 300 mg via ORAL
  Filled 2021-02-07: qty 1

## 2021-02-07 MED ORDER — AMLODIPINE BESYLATE 10 MG PO TABS
10.0000 mg | ORAL_TABLET | Freq: Every day | ORAL | Status: DC
  Administered 2021-02-07: 10 mg via ORAL
  Filled 2021-02-07 (×2): qty 1

## 2021-02-07 MED ORDER — ATORVASTATIN CALCIUM 40 MG PO TABS
40.0000 mg | ORAL_TABLET | Freq: Every day | ORAL | Status: DC
  Administered 2021-02-07 – 2021-02-08 (×2): 40 mg via ORAL
  Filled 2021-02-07 (×2): qty 1

## 2021-02-07 MED ORDER — FLUOXETINE HCL 20 MG PO CAPS
20.0000 mg | ORAL_CAPSULE | Freq: Every day | ORAL | Status: DC
Start: 2021-02-08 — End: 2021-02-08
  Administered 2021-02-08: 20 mg via ORAL
  Filled 2021-02-07: qty 1

## 2021-02-07 MED ORDER — ACETAMINOPHEN 160 MG/5ML PO SOLN: 650.0000 mg | ORAL | Status: DC | PRN

## 2021-02-07 MED ORDER — ACETAMINOPHEN 325 MG PO TABS: 650.0000 mg | ORAL_TABLET | ORAL | Status: DC | PRN

## 2021-02-07 MED ORDER — INSULIN GLARGINE 100 UNIT/ML SOLOSTAR PEN: 130.0000 [IU] | PEN_INJECTOR | SUBCUTANEOUS | Status: DC

## 2021-02-07 MED ORDER — SODIUM CHLORIDE 0.9 % IV SOLN: INTRAVENOUS | Status: DC

## 2021-02-07 MED ORDER — LISINOPRIL-HYDROCHLOROTHIAZIDE 20-12.5 MG PO TABS: 2.0000 | ORAL_TABLET | Freq: Every day | ORAL | Status: DC

## 2021-02-07 MED ORDER — GABAPENTIN 600 MG PO TABS
300.0000 mg | ORAL_TABLET | Freq: Every day | ORAL | Status: DC
  Administered 2021-02-07: 300 mg via ORAL
  Filled 2021-02-07: qty 1

## 2021-02-07 MED ORDER — HYDRALAZINE HCL 25 MG PO TABS: 25.0000 mg | ORAL_TABLET | Freq: Three times a day (TID) | ORAL | Status: DC

## 2021-02-07 MED ORDER — METOPROLOL SUCCINATE ER 50 MG PO TB24
50.0000 mg | ORAL_TABLET | Freq: Every day | ORAL | Status: DC
  Filled 2021-02-07 (×2): qty 1

## 2021-02-07 MED ORDER — LISINOPRIL 20 MG PO TABS
40.0000 mg | ORAL_TABLET | Freq: Every day | ORAL | Status: DC
  Filled 2021-02-07: qty 2

## 2021-02-07 MED ORDER — CLOPIDOGREL BISULFATE 75 MG PO TABS
75.0000 mg | ORAL_TABLET | Freq: Every day | ORAL | Status: DC
  Administered 2021-02-07 – 2021-02-08 (×2): 75 mg via ORAL
  Filled 2021-02-07 (×2): qty 1

## 2021-02-07 MED ORDER — STROKE: EARLY STAGES OF RECOVERY BOOK
Freq: Once | Status: AC
  Filled 2021-02-07: qty 1

## 2021-02-07 MED ORDER — ASPIRIN 81 MG PO CHEW
81.0000 mg | CHEWABLE_TABLET | Freq: Once | ORAL | Status: AC
  Administered 2021-02-07: 81 mg via ORAL
  Filled 2021-02-07: qty 1

## 2021-02-07 MED ORDER — ENOXAPARIN SODIUM 40 MG/0.4ML IJ SOSY
40.0000 mg | PREFILLED_SYRINGE | INTRAMUSCULAR | Status: DC
  Administered 2021-02-07: 40 mg via SUBCUTANEOUS
  Filled 2021-02-07: qty 0.4

## 2021-02-07 MED ORDER — HYDRALAZINE HCL 25 MG PO TABS
25.0000 mg | ORAL_TABLET | Freq: Three times a day (TID) | ORAL | Status: DC
  Administered 2021-02-07: 25 mg via ORAL
  Filled 2021-02-07 (×2): qty 1

## 2021-02-07 MED ORDER — PANTOPRAZOLE SODIUM 40 MG PO TBEC
40.0000 mg | DELAYED_RELEASE_TABLET | Freq: Every day | ORAL | Status: DC
  Administered 2021-02-07 – 2021-02-08 (×2): 40 mg via ORAL
  Filled 2021-02-07 (×3): qty 1

## 2021-02-07 NOTE — H&P (Signed)
History and Physical    [name] HBZ:[id number] DOB: 09/25/1960 DOA: 02/06/2021  PCP: [name], [name], MD Consultants:  Redmond Baseman - ENT; Silverio Decamp - GI; Loanne Drilling - endocrinology; [facility] - oncology; [facility] - podiatry Patient coming from:  Home - lives alone; NOK: [name], [place]  Chief Complaint: Leg symptoms  HPI: [name] is a 60 y.o. female with medical history significant of CVA; DM; Guillain-Barre; HTN; lymphoma; and HLD presenting with LE numbness.   She went to [facility] yesterday for LE edema and had labs done but left due to the long wait.  She then went to [facility] last night with c/o LE numbness.  She had seen ENT for dizziness and MRI was done that showed a punctate area in the superior L pons that could be c/w acute CVA as well as another lesion in the superior L pons that could be a subacute infarct.  She first came because her legs were acting crazy, has neuropathy in them and it seemed to be coming up on her legs.  She was given gabapentin but it still feels like she is wearing knee high socks.  She also mentioned the abnormal MRI and they were concerned about that too.   Periodic dizziness - when it starts she has a feeling on R head head and then the world spins and her vision gets cloudy and she can't read.  It passes within 15-30 minutes.  It happens daily, maybe 2 times a day.  Sometimes it is triggered by moving in the car.  No dysphagia or dysarthria.  The dizziness episodes started when she was at work the week of the 24th at August; it started at work and she has not been to work since.    ED Course: [facility] to [facility] transfer, per Dr. Tonie Griffith:  60 yo female with hx of DMT2 with neuropathy in legs, vascular diseae, hx of CVA who presented with increased weakness in legs. Had MRI brain on 10/5 from ENT for dizziness workup and had two small areas of CVA in pons. Neurology requested keeping for stroke workup and pt concerned she could not get in with PCP easily for follow up  and wanted to stay  Review of Systems: As per HPI; otherwise review of systems reviewed and negative.   Ambulatory Status:  Ambulates without assistance  COVID Vaccine Status:  Complete plus booster  Past Medical History:  Diagnosis Date   Abnormality of gait 05/10/2010   BACK PAIN 11/14/2008   DIABETES MELLITUS, TYPE II 07/15/2008   Diplopia 07/15/2008   ECZEMA, ATOPIC 04/03/2009   Guillain-Barre ([place]) 1988   HYPERLIPIDEMIA 03/06/2009   HYPERTENSION 07/15/2008   Stroke ([place]) 2010, 2011   x2    Vertebral artery stenosis     Past Surgical History:  Procedure Laterality Date   ABDOMINAL HYSTERECTOMY     DILATION AND CURETTAGE OF UTERUS     FOOT SURGERY      Social History   Socioeconomic History   Marital status: Single    Spouse name: Not on file   Number of children: Not on file   Years of education: Not on file   Highest education level: Not on file  Occupational History   Occupation: custodian at [employer]	[place]  Tobacco Use   Smoking status: Never   Smokeless tobacco: Never  Vaping Use   Vaping Use: Never used  Substance and Sexual Activity   Alcohol use: No   Drug use: No   Sexual activity: Not  on file  Other Topics Concern   Not on file  Social History Narrative   Not on file   Social Determinants of Health   Financial Resource Strain: Not on file  Food Insecurity: Not on file  Transportation Needs: Not on file  Physical Activity: Not on file  Stress: Not on file  Social Connections: Not on file  Intimate Partner Violence: Not on file    No Known Allergies  Family History  Problem Relation Age of Onset   Diabetes Sister    Asthma Other    Stroke Other    Hypertension Other    Diabetes Mother    Stroke Mother    Cancer Father        pt states hae had some kind of stomach cancer, ? stomach or colon   Breast cancer Neg Hx     Prior to Admission medications   Medication Sig Start Date End Date Taking? Authorizing Provider   acetaminophen (TYLENOL) 500 MG tablet Take 2 tablets (1,000 mg total) by mouth every 6 (six) hours as needed. 11/19/19   [name], MD  amLODipine (NORVASC) 10 MG tablet Take 1 tablet by mouth once daily 11/09/20   [name], [name], MD  atorvastatin (LIPITOR) 40 MG tablet Take 1 tablet by mouth once daily 12/28/20   [name], [name], MD  Blood Pressure Monitoring (BLOOD PRESSURE MONITOR AUTOMAT) DEVI 1 Device by Does not apply route daily. 08/29/18   [name], [name], MD  clopidogrel (PLAVIX) 75 MG tablet Take 1 tablet (75 mg total) by mouth daily. 11/10/20   [name], [name], MD  famotidine (PEPCID) 20 MG tablet Take 1 tablet by mouth twice daily for 14 days 04/06/20   [name], [name], MD  FLUoxetine (PROZAC) 20 MG capsule Take 1 capsule (20 mg total) by mouth daily. 07/17/20   [name], [name], MD  glucose blood (CONTOUR NEXT TEST) test strip 1 each by Other route 2 (two) times daily. And lancets 2/day 03/09/18   [name], MD  glucose blood [facility] VERIO) test strip USE TO CHECK BLOOD SUGAR TWICE A DAY AND PRN 07/29/15   [name], MD  hydrALAZINE (APRESOLINE) 25 MG tablet Take 1 tablet (25 mg total) by mouth 3 (three) times daily. Patient taking differently: Take 25 mg by mouth daily. 08/19/20   [name], [name], MD  insulin glargine (LANTUS) 100 UNIT/ML Solostar Pen Inject 130 Units into the skin every morning. And pen needles 1/day 10/20/20   [name], MD  lisinopril-hydrochlorothiazide (ZESTORETIC) 20-12.5 MG tablet Take 2 tablets by mouth once daily 11/09/20   [name], [name], MD  meclizine (ANTIVERT) 12.5 MG tablet Take 1 tablet (12.5 mg total) by mouth every 12 (twelve) hours. 12/29/20   [name], [name], MD  metoprolol succinate (TOPROL-XL) 50 MG 24 hr tablet TAKE 1 TABLET BY MOUTH ONCE DAILY WITH  OR  IMMEDIATELY  FOLLOWING  A  MEAL 10/06/20   [name], [name], MD  omeprazole (PRILOSEC) 20 MG capsule Take 1 capsule by mouth once daily 12/28/20   [name], [name], MD  ONE TOUCH  LANCETS MISC USE TO CHECK BLOOD SUGAR TWICE A DAY AND PRN 07/29/15   [name], MD  SUPREP BOWEL PREP KIT 17.5-3.13-1.6 GM/177ML SOLN Take 354 mLs by mouth once. Take the night before and the morning of colonoscopy. 12/23/20   [provider]  triamcinolone ointment (KENALOG) 0.5 % Apply topically 2 (two) times daily as needed. 10/23/19  [name], [name], MD    Physical Exam: Vitals:   02/07/21 1430 02/07/21 1445 02/07/21 1530 02/07/21 1636  BP: 139/78 140/76 (!) 168/90 (!) 155/81  Pulse: 74 77 73 82  Resp: _0 Temp:    98.3 F (36.8 C)  TempSrc:    Oral  SpO2: 95% 94% 98% 98%  Weight:      Height:         General:  Appears calm and comfortable and is in NAD Eyes:  EOMI, normal lids, iris ENT:  grossly normal hearing, lips & tongue, mmm Neck:  no LAD, masses or thyromegaly; no carotid bruits Cardiovascular:  RRR, no m/r/g. No LE edema.  Respiratory:   CTA bilaterally with no wheezes/rales/rhonchi.  Normal respiratory effort. Abdomen:  soft, NT, ND Skin:  no rash or induration seen on limited exam Musculoskeletal:  grossly normal tone BUE/BLE, good ROM, no bony abnormality Lower extremity:  No LE edema.  Limited foot exam with no ulcerations.  2+ distal pulses. Psychiatric:  grossly normal mood and affect, speech fluent and appropriate, AOx3 Neurologic:  CN 2-12 grossly intact, moves all extremities in coordinated fashion, sensation intact    Radiological Exams on Admission: Independently reviewed - see discussion in A/P where applicable  No results found.  EKG: pending   Labs on Admission: I have personally reviewed the available labs and imaging studies at the time of the admission.  Pertinent labs:   Glucose 291 -> 164 BUN 23/Creatinine 1.11/GFR 57 Unremarkable CBC on 10/8 COVID/flu negative UA: >500 glucose, moderate Hgb, large LE, >300 protein, rare bacteria, >50 WBC   Assessment/Plan Principal Problem:   Abnormal MRI of  head Active Problems:   Diabetic peripheral neuropathy associated with type 2 diabetes mellitus (HCC)   Vertigo   DM (diabetes mellitus), type 2 with peripheral vascular complications (HCC)   Essential hypertension   Hyperlipidemia associated with type 2 diabetes mellitus (HCC)   Small lymphocytic lymphoma (HCC)     * Abnormal MRI of head -Patient with incidental finding of possible acute and subacute CVA on 10/5 MRI, presenting with LE numbness/tingling -Somewhat concerning for CVA but no apparent symptoms at this time -Will place in observation status for CVA evaluation -Continue Plavix -Telemetry monitoring -MRI/MRA -Carotid dopplers in 04/2020 showed mild B ICA stenosis. -Echo ordered -Risk stratification with FLP; will also check UDS -Neurology consult -PT/OT/ST/Nutrition Consults  Class 1 obesity due to excess calories with body mass index (BMI) of 31.0 to 31.9 in adult -Body mass index is 31.35 kg/m..  -Weight loss should be encouraged -Outpatient PCP/bariatric medicine f/u encouraged  Vertigo -Could be associated with CVA or could be unrelated -She saw ENT for this recently and that is what led to her MRI -Has been taking meclizine -She is likely to benefit from vestibular PT evaluation if MRI does not show acute CVA  Diabetic peripheral neuropathy associated with type 2 diabetes mellitus ([place]) -LE symptoms appear to be related to neuropathy at this time - although CVA is a consideration, it would be unlikely to cause B neuropathic symptoms -Will start gabapentin  DM (diabetes mellitus), type 2 with peripheral vascular complications ([place]) -Prior A1c was 10.7, indicating very poor control -Continue Lantus (may benefit from split dosing since she is on a very large dose) -Cover with moderate-scale SSI   Essential hypertension -Does not need permissive HTN -Will resume home meds - hydralazine starting now (resume TID as ordered, but has only been taking daily),  Toprol,  Zestoretic and Norvasc in AM  Hyperlipidemia associated with type 2 diabetes mellitus (HCC) -Continue Lipitor 40 mg daily for now -Check lipids - at last check her LDL was markedly elevated (172)  Small lymphocytic lymphoma (HCC) -Appears to have been incidentally noted on mammogram in 2021 -Watchful waiting management per oncology's most recent note -Followed by Dr. Irene Limbo       Note: This patient has been tested and is negative for the novel coronavirus COVID-19. She has been fully vaccinated against COVID-19.    Level of care: Telemetry  DVT prophylaxis:  Lovenox  Code Status: Full - confirmed with patient Family Communication: None present Disposition Plan:  The patient is from: home  Anticipated d/c is to: home without [facility] services  Anticipated d/c date will depend on clinical response to treatment, but possibly as early as tomorrow if she has excellent response to treatment  Patient is currently: acutely ill Consults called: Neurology; PT/OT/ST/Nutrition; [facility] team Admission status: It is my clinical opinion that referral for OBSERVATION is reasonable and necessary in this patient based on the above information provided. The aforementioned taken together are felt to place the patient at high risk for further clinical deterioration. However it is anticipated that the patient may be medically stable for discharge from the hospital within 24 to 48 hours.    Karmen Bongo MD [facility]   How to contact the [facility] Attending or Consulting provider [place] or covering provider during after hours [place], for this patient?  Check the care team in [facility] and look for a) attending/consulting [facility] provider listed and b) the [facility] team listed Log into [email or web address] and use [place]'s universal password to access. If you do not have the password, please contact the hospital operator. Locate the [facility] provider you are looking for under [facility] and page to a number that  you can be directly reached. If you still have difficulty reaching the provider, please page the [facility] (Director on Call) for the Hospitalists listed on amion for assistance.   02/07/2021, 5:36 PM

## 2021-02-07 NOTE — Assessment & Plan Note (Signed)
-  Continue Lipitor 40 mg daily for now -Check lipids - at last check her LDL was markedly elevated (172)

## 2021-02-07 NOTE — Progress Notes (Addendum)
HOSPITAL MEDICINE OVERNIGHT EVENT NOTE    Notified by radiology that noncontrast MRI brain as well as MRA of the head are complete.  This has revealed to acute/subacute infarcts within the left pons measuring up to 8 mm.  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  Chart reviewed, patient is currently appropriately being treated for stroke with dual antiplatelet therapy statin therapy and serial neurologic checks and close monitoring on telemetry.  Neurology is already following the patient.  Per my discussion with nursing, patient is neurologically stable.   No changes to orders at this time, continue to monitor patient closely.   Lisa Emerald  MD [facility]

## 2021-02-07 NOTE — Assessment & Plan Note (Signed)
-  Appears to have been incidentally noted on mammogram in 2021 -Watchful waiting management per oncology's most recent note -Followed by Dr. Irene Limbo

## 2021-02-07 NOTE — Assessment & Plan Note (Signed)
-  Prior A1c was 10.7, indicating very poor control -Continue Lantus (may benefit from split dosing since she is on a very large dose) -Cover with moderate-scale SSI

## 2021-02-07 NOTE — Assessment & Plan Note (Signed)
-  Patient with incidental finding of possible acute and subacute CVA on 10/5 MRI, presenting with LE numbness/tingling -Somewhat concerning for CVA but no apparent symptoms at this time -Will place in observation status for CVA evaluation -Continue Plavix -Telemetry monitoring -MRI/MRA -Carotid dopplers in 04/2020 showed mild B ICA stenosis. -Echo ordered -Risk stratification with FLP; will also check UDS -Neurology consult -PT/OT/ST/Nutrition Consults

## 2021-02-07 NOTE — Assessment & Plan Note (Signed)
-  LE symptoms appear to be related to neuropathy at this time - although CVA is a consideration, it would be unlikely to cause B neuropathic symptoms -Will start gabapentin

## 2021-02-07 NOTE — Consult Note (Addendum)
Neurology Consultation Reason for Consult: MRI at OSH with concern for subacute infarct  Requesting Physician: Dr. Lorin Mercy  CC: Bilateral lower extremity stocking glove numbness  History is obtained from: Patient, chart review  HPI: [name] is a 60 y.o. 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 She states that she first noticed numbness in bilateral feet approximately 2 weeks ago that has progressed up both legs to just below her knees and "it feels like I am wearing socks or shoes when I am not". She states that the sensory deficits sometimes cause her gait to be wobbly but denies any lower extremity weakness or falls. She also endorses that she gets an achy feeling that starts at her left head and travels to the right head and down her right face with a spinning sensation in her vision and diplopia. She states that the diplopia is "like when I had my last stroke". She states that it goes away when she closes her eyes but her eyes burn and itch when she opens them again and she has blurry vision. She states that these events happen daily and last about 15 minutes each. She states that her visual deficits can be onset by going over a bump or sharp curve in the car as well.   Per EDP eval: "Bilateral lower leg discomfort for several weeks.  Neurovascularly intact.   Suspect likely diabetic neuropathy. Low suspicion for DVT, low suspicion for CHF.  No significant edema.  Pulses are intact. Will initiate  gabapentin. Electrolytes and CK are normal.   Patient did have MRI on 02/03/21: IMPRESSION:  1. Punctate focus of restricted diffusion in the inferior left pons,  without associated enhancement, most likely an acute infarct.  2. Additional focus of restricted diffusion with enhancement in the  more superior left pons, most likely a subacute infarct.    Discussed with Dr. Cheral Marker of neurology.  He states patient has appeared to fail Plavix and will benefit from starting aspirin as well. Discussed that she could be admitted tonight for stroke work-up versus follow-up as an outpatient.  He encourages her to have close follow-up as stroke is more likely in the recent days after a stroke   Discussed with patient.  She states her doctor is supposed to be referring her to neurology but she has not heard anything yet.  She was given the option of outpatient versus inpatient neurology evaluation and stroke work-up.  She elects to stay in [place] for neurolog Lab Results  Component Value Date   HGBA1C 10.7 (A) 10/20/2020   ROS: All other review of systems was negative except as noted in the HPI.   Past Medical History:  Diagnosis Date   Abnormality of gait 05/10/2010   BACK PAIN 11/14/2008   CEREBROVASCULAR DISEASE 08/05/2008   DIABETES MELLITUS, TYPE II 07/15/2008   Diplopia 07/15/2008   ECZEMA, ATOPIC 04/03/2009   Guillain-Barre ([place])    HYPERLIPIDEMIA 03/06/2009   HYPERTENSION 07/15/2008   Stroke ([place]) 2010, 2011   x2    Vertebral artery stenosis    Past Surgical History:  Procedure Laterality  Date   ABDOMINAL HYSTERECTOMY     DILATION AND CURETTAGE OF UTERUS     FOOT SURGERY     Family History  Problem Relation Age of Onset   Diabetes Sister    Asthma Other    Stroke Other    Hypertension Other    Diabetes Mother    Stroke Mother    Cancer Father        pt states hae had some kind of stomach cancer, ? stomach or colon   Breast cancer Neg Hx    Social History:  reports  that she has never smoked. She has never used smokeless tobacco. She reports that she does not drink alcohol and does not use drugs.  Exam: Current vital signs: BP (!) 168/90   Pulse 73   Temp 97.8 F (36.6 C) (Oral)   Resp 17   Ht 5' 4.5" (1.638 m)   Wt 84.1 kg   SpO2 98%   BMI 31.35 kg/m  Vital signs in last 24 hours: Temp:  [97.8 F (36.6 C)-98.3 F (36.8 C)] 97.8 F (36.6 C) (10/09 1336) Pulse Rate:  [46-77] 73 (10/09 1530) Resp:  [14-22] 17 (10/09 1530) BP: (109-212)/(62-120) 168/90 (10/09 1530) SpO2:  [91 %-100 %] 98 % (10/09 1530) Weight:  [84.1 kg] 84.1 kg (10/08 2106)  Physical Exam  Constitutional: Appears well-developed and well-nourished, in no acute distress Psych: Affect appropriate to situation, she is calm and cooperative with examination  Eyes: No scleral injection, normal conjunctivae HENT: No oropharyngeal obstruction, dry mucous membranes MSK: no joint deformities or swelling noted Cardiovascular: Normal rate and regular rhythm.  Respiratory: Effort normal, non-labored breathing on room air GI: Soft.  No distension. There is no tenderness.  Skin: Warm dry and intact visible skin  Neuro: Mental Status: Patient is awake, alert, oriented to person, place, month, year, and situation. Patient is able to give a clear and coherent history of present illness. No signs of aphasia or neglect. Cranial Nerves: II: Visual Fields are full. Pupils are equal, round, and reactive to light.  III,IV, VI: EOMI without ptosis. She briefly describes diplopia when tracking in the inferior visual field but this quickly improves and does not return with further tracking in the inferior visual field.  V: Facial sensation is symmetric to light touch VII: Facial movement is symmetric resting and smiling VIII: Hearing is intact to voice X: Palate elevates symmetrically XI: Shoulder shrug is symmetric. XII: Tongue is midline without atrophy or fasciculations.  Motor: Tone is  normal. Bulk is normal. 5/5 strength was present in all four extremities without vertical drift.  Sensory: Sensation is symmetric to light touch and temperature in BUE.  Patient complains of decreased sensation to light touch in bilateral lower extremities from the feet to just below her knees. She states that when she walks she feels like her feet are "puffy" or that she is walking on a rug.  Deep Tendon Reflexes: 2+ and symmetric in the biceps and patellae.  Cerebellar: FNF and HKS are intact bilaterally without dysmetria.  NIHSS total 1 Score breakdown: 1 for sensory  I have reviewed labs in epic and the results pertinent to this consultation are: CBC    Component Value Date/Time   WBC 6.1 02/06/2021 1348   RBC 4.99 02/06/2021 1348   HGB 13.0 02/06/2021 1348   HGB 13.8 10/15/2019 0912   HCT 38.7 02/06/2021 1348   PLT 257 02/06/2021 1348   PLT 232 10/15/2019 0912   MCV 77.6 (  L) 02/06/2021 1348   MCH 26.1 02/06/2021 1348   MCHC 33.6 02/06/2021 1348   RDW 13.9 02/06/2021 1348   LYMPHSABS 2.3 02/06/2021 1348   MONOABS 0.7 02/06/2021 1348   EOSABS 0.1 02/06/2021 1348   BASOSABS 0.0 02/06/2021 1348   CMP     Component Value Date/Time   NA 135 02/07/2021 0047   K 3.8 02/07/2021 0047   CL 103 02/07/2021 0047   CO2 24 02/07/2021 0047   GLUCOSE 164 (H) 02/07/2021 0047   GLUCOSE 129 (H) 04/28/2006 0951   BUN 23 02/07/2021 0047   CREATININE 1.11 (H) 02/07/2021 0047   CREATININE 1.21 (H) 09/04/2020 1100   CALCIUM 9.9 02/07/2021 0047   PROT 7.3 09/04/2020 1100   ALBUMIN 3.2 (L) 09/04/2020 1100   AST 16 09/04/2020 1100   ALT 18 09/04/2020 1100   ALKPHOS 99 09/04/2020 1100   BILITOT 0.4 09/04/2020 1100   GFRNONAA 57 (L) 02/07/2021 0047   GFRNONAA 51 (L) 09/04/2020 1100   GFRAA >60 01/10/2020 1103   Lab Results  Component Value Date   CHOL 240 (H) 10/09/2019   HDL 37.50 (L) 10/09/2019   LDLCALC 172 (H) 10/09/2019   LDLDIRECT 175.9 03/02/2012   TRIG 152.0 (H) 10/09/2019    CHOLHDL 6 10/09/2019   Lab Results  Component Value Date   HGBA1C 10.7 (A) 10/20/2020   I have reviewed the images obtained: MRI brain with and without contrast from February 03, 2021 personally reviewed by attending MD.  Agree with radiology read: 1. Punctate focus of restricted diffusion in the inferior left pons, without associated enhancement, most likely a subacute infarct 2. Additional focus of restricted diffusion with enhancement in the more superior left pons, most likely a subacute infarct  Lesion 2 and lesion 1, from left to right, DWI   Post contrast image of lesion 2  Impression: 60 y.o. female with history as above who presented for evaluation of bilateral lower extremity sensory deficit described as numbness "feeling like I am wearing a sock to my knees when I am not" and for evaluation of MRI brain obtained outpatient by ENT for patient complaints of dizziness.  - Examination reveals patient with stocking glove decreased sensation in bilateral lower extremities and with brief diplopia in downward gaze that is not consistent. She also describes episodes of abnormal sensations traveling across her head down her right face with visual disturbances daily that occur for approximately 15 minutes at a time.  - Stroke risk factors include obesity with BMI 31.35, history of HTN, HLD, history of CVA, and DM2.  - Outpatient MRI brain revealed a punctate focus of restricted diffusion in the inferior left pons without associated enhancement and an additional focus of restricted diffusion with enhancement in the more superior left pons but is felt to most likely be a subacute infarcts. Due to patient's multiple stroke risk factors, age, and presentation strokes are the most likely etiology for the lesions seen on MRI  - Patient sensory deficits are likely related to diabetic polyneuropathy in the setting of poorly controlled type 2 diabetes mellitus and not felt to be related to lesions  identified on MRI brain due to bilateral involvement of sensory deficits and lesions identified on the left pons. It is possible that her dizziness can be explained by the lesions identified.  Recommendations: - HgbA1c, fasting lipid panel with statin for LDL goal of < 70 - CT angio head and neck vessel imaging - Frequent neuro checks - Echocardiogram pending -  Prophylactic therapy-  Antiplatelet med: Aspirin - dose 325mg  PO or 300mg  PR followed by ASA 81 mg PO daily and continue home clopidogrel - Goal normotension - Risk factor modification - Telemetry monitoring - PT consult, OT consult, Speech consult - Stroke team to follow - Agree with vestibular PT for vertigo - Stroke team to consider a trial of QHS topical lidocaine and capsaicin creme, applied to her feet and lower legs (lidocaine application 74-08 minutes prior to capsaicin) for her peripheral neuropathic pain.   Anibal Henderson, AGACNP-BC [facility]  [phone]  Addendum: Repeat MRI brain in addition to MRA head have been completed: MRI brain:  1. Two acute/early subacute infarcts within the left pons measuring up to 8 mm. 2. Otherwise stable noncontrast MRI appearance of the brain as compared to 04/09/2020. 3. Mild chronic small vessel ischemic changes within the cerebral white matter. 4. Small chronic lacunar infarct within the left cerebellar hemisphere. 5. Incompletely imaged and incompletely assessed cervical lymphadenopathy.   MRA head:  1. New from the prior MRA head of 05/10/2010, the distal V4 right vertebral artery appears occluded. Also new, there is near-occlusive stenosis versus occlusion of the V4 left vertebral artery (just beyond the left PICA origin). Redemonstrated severe stenosis within the distal V4 left vertebral artery. The basilar artery is patent, but somewhat diminutive. 2. Progressive atherosclerotic disease elsewhere, as outlined and with findings most notably as  follows. 3. Progressive severe stenosis within the P1 left PCA. Progressive moderate/severe stenosis within the left PCA at the P2/P3 junction. 4. New severe stenosis within the left posterior communicating artery. 5. Progressive moderate stenosis of the supraclinoid right ICA. 6. Progressive moderate stenosis of the proximal M1 right MCA. Progressive moderate/severe stenosis within the distal M1 right MCA. 7. Redemonstrated severe stenosis within an inferior division proximal M2 right MCA vessel. 8. New moderate/severe stenosis within a superior division proximal M2 left MCA vessel. 9. Redemonstrated moderate/severe stenosis within an inferior division proximal M2 left MCA vessel. 10. Progressive moderate/severe stenosis within the proximal A1 right ACA. 11. 2 mm inferiorly projecting vascular protrusion arising from the cavernous left ICA, which may reflect atherosclerotic lobulation or a small aneurysm. This finding was not definitively present on the prior MRA.  I have seen and examined the patient. I have reviewed the assessment and recommendations, making amendations as needed. 60 year old female with multiple stroke risk factors presenting with acute and subacute lacunar strokes within the left pons. Also with peripheral neuropathic symptoms and signs, which are chronic.  Electronically signed: Dr. Kerney Elbe

## 2021-02-07 NOTE — Assessment & Plan Note (Signed)
-  Body mass index is 31.35 kg/m..  -Weight loss should be encouraged -Outpatient PCP/bariatric medicine f/u encouraged

## 2021-02-07 NOTE — Assessment & Plan Note (Signed)
-  Could be associated with CVA or could be unrelated -She saw ENT for this recently and that is what led to her MRI -Has been taking meclizine -She is likely to benefit from vestibular PT evaluation if MRI does not show acute CVA

## 2021-02-07 NOTE — ED Notes (Signed)
[facility] at bedside 

## 2021-02-07 NOTE — Assessment & Plan Note (Signed)
-  Does not need permissive HTN -Will resume home meds - hydralazine starting now (resume TID as ordered, but has only been taking daily), Toprol, Zestoretic and Norvasc in AM

## 2021-02-08 ENCOUNTER — Observation Stay (HOSPITAL_BASED_OUTPATIENT_CLINIC_OR_DEPARTMENT_OTHER): Payer: BC Managed Care – PPO

## 2021-02-08 ENCOUNTER — Observation Stay (HOSPITAL_COMMUNITY): Payer: BC Managed Care – PPO

## 2021-02-08 DIAGNOSIS — I6389 Other cerebral infarction: Secondary | ICD-10-CM | POA: Diagnosis not present

## 2021-02-08 DIAGNOSIS — I639 Cerebral infarction, unspecified: Secondary | ICD-10-CM

## 2021-02-08 DIAGNOSIS — I6330 Cerebral infarction due to thrombosis of unspecified cerebral artery: Secondary | ICD-10-CM | POA: Diagnosis not present

## 2021-02-08 DIAGNOSIS — E1142 Type 2 diabetes mellitus with diabetic polyneuropathy: Secondary | ICD-10-CM

## 2021-02-08 DIAGNOSIS — I10 Essential (primary) hypertension: Secondary | ICD-10-CM | POA: Diagnosis not present

## 2021-02-08 DIAGNOSIS — R930 Abnormal findings on diagnostic imaging of skull and head, not elsewhere classified: Secondary | ICD-10-CM | POA: Diagnosis not present

## 2021-02-08 LAB — LIPID PANEL
Cholesterol: 223 mg/dL — ABNORMAL HIGH (ref 0–200)
HDL: 35 mg/dL — ABNORMAL LOW (ref >40)
LDL Cholesterol: 169 mg/dL — ABNORMAL HIGH (ref 0–99)
Total CHOL/HDL Ratio: 6.4 RATIO
Triglycerides: 93 mg/dL (ref <150)
VLDL: 19 mg/dL (ref 0–40)

## 2021-02-08 LAB — RAPID URINE DRUG SCREEN, HOSP PERFORMED
Amphetamines: NOT DETECTED
Barbiturates: NOT DETECTED
Benzodiazepines: NOT DETECTED
Cocaine: NOT DETECTED
Opiates: NOT DETECTED
Tetrahydrocannabinol: NOT DETECTED

## 2021-02-08 LAB — HEMOGLOBIN A1C
Hgb A1c MFr Bld: 12.8 % — ABNORMAL HIGH (ref 4.8–5.6)
Mean Plasma Glucose: 320.66 mg/dL

## 2021-02-08 LAB — ECHOCARDIOGRAM COMPLETE
Area-P 1/2: 3.03 cm2
Calc EF: 72.7 %
Height: 64.5 in
S' Lateral: 2.9 cm
Single Plane A2C EF: 81.2 %
Single Plane A4C EF: 64.4 %
Weight: 2968 oz

## 2021-02-08 LAB — HIV ANTIBODY (ROUTINE TESTING W REFLEX): HIV Screen 4th Generation wRfx: NONREACTIVE

## 2021-02-08 LAB — GLUCOSE, CAPILLARY
Glucose-Capillary: 157 mg/dL — ABNORMAL HIGH (ref 70–99)
Glucose-Capillary: 183 mg/dL — ABNORMAL HIGH (ref 70–99)
Glucose-Capillary: 184 mg/dL — ABNORMAL HIGH (ref 70–99)
Glucose-Capillary: 190 mg/dL — ABNORMAL HIGH (ref 70–99)

## 2021-02-08 MED ORDER — GLUCERNA SHAKE PO LIQD
237.0000 mL | Freq: Three times a day (TID) | ORAL | Status: DC
  Administered 2021-02-08: 237 mL via ORAL

## 2021-02-08 MED ORDER — ASPIRIN EC 81 MG PO TBEC: 81.0000 mg | DELAYED_RELEASE_TABLET | Freq: Every day | ORAL | 0 refills | Status: AC

## 2021-02-08 MED ORDER — TOPIRAMATE 25 MG PO TABS: 25.0000 mg | ORAL_TABLET | Freq: Two times a day (BID) | ORAL | 0 refills | Status: DC

## 2021-02-08 MED ORDER — ADULT MULTIVITAMIN W/MINERALS CH
1.0000 | ORAL_TABLET | Freq: Every day | ORAL | Status: DC
  Administered 2021-02-08: 1 via ORAL
  Filled 2021-02-08: qty 1

## 2021-02-08 MED ORDER — ATORVASTATIN CALCIUM 80 MG PO TABS: 80.0000 mg | ORAL_TABLET | Freq: Every day | ORAL | 0 refills | Status: DC

## 2021-02-08 MED ORDER — ENSURE MAX PROTEIN PO LIQD
11.0000 [oz_av] | Freq: Every day | ORAL | Status: DC
  Filled 2021-02-08: qty 330

## 2021-02-08 MED ORDER — PERFLUTREN LIPID MICROSPHERE
1.0000 mL | INTRAVENOUS | Status: AC | PRN
  Administered 2021-02-08: 2 mL via INTRAVENOUS
  Filled 2021-02-08: qty 10

## 2021-02-08 MED ORDER — STROKE: EARLY STAGES OF RECOVERY BOOK: Freq: Once | Status: AC

## 2021-02-08 NOTE — Progress Notes (Signed)
PROGRESS NOTE    [name]  HGD:[id number] DOB: 1960-10-14 DOA: 02/06/2021 PCP: [name], [name], MD    Chief Complaint  Patient presents with   Leg Pain    Brief Narrative:      [name] is a 60 y.o. female with medical history significant of CVA; DM; Guillain-Barre; HTN; lymphoma; and HLD presenting with LE numbness.   She went to [facility] yesterday for LE edema and had labs done but left due to the long wait.  She then went to [facility] last night with c/o LE numbness.  She had seen ENT for dizziness and MRI was done that showed a punctate area in the superior L pons that could be c/w acute CVA as well as another lesion in the superior L pons that could be a subacute infarct.  Patient presents with complaints of bilateral upper and lower extremity tingling/numbness, her MRI significant for acute CVA,.   Assessment & Plan:   Principal Problem:   Abnormal MRI of head Active Problems:   Hyperlipidemia associated with type 2 diabetes mellitus (HCC)   Essential hypertension   DM (diabetes mellitus), type 2 with peripheral vascular complications (HCC)   Diabetic peripheral neuropathy associated with type 2 diabetes mellitus (HCC)   Small lymphocytic lymphoma (HCC)   Vertigo   Class 1 obesity due to excess calories with body mass index (BMI) of 31.0 to 31.9 in adult   Cerebral thrombosis with cerebral infarction   Acute CVA/stroke - MRI brain Two acute/early subacute infarcts within the left pons measuring up to 8 mm. - CTA head and neck is pending. -2D echo with a preserved EF 55 to 60%.  Evidence of grade 3 moderate protruding plaque involving the descending aorta. -Further recommendation per neurology, patient was seen by PT/OT -On aspirin and Plavix.    Class 1 obesity due to excess calories with body mass index (BMI) of 31.0 to 31.9 in adult -Body mass index is 31.35 kg/m..  -Outpatient PCP/bariatric medicine f/u encouraged   Vertigo -Possibly related to her  cerebellar stroke, continue with PT/OT   Diabetic peripheral neuropathy associated with type 2 diabetes mellitus ([place]) - started on gabapentin   DM (diabetes mellitus), type 2 with peripheral vascular complications (HCC) -Prior A1c was 10.7, indicating very poor control -Continue Lantus (may benefit from split dosing since she is on a very large dose) -Cover with moderate-scale SSI     Essential hypertension -Allow for permissive hypertension.  And to gradually normalize in 5 to 7 days.   Hyperlipidemia associated with type 2 diabetes mellitus (HCC) -Continue Lipitor 40 mg daily for now    Small lymphocytic lymphoma ([place]) -Appears to have been incidentally noted on mammogram in 2021 -Watchful waiting management per oncology's most recent note -Followed by Dr. Irene Limbo    DVT prophylaxis: Lovenox Code Status: Full Family Communication: None at bedside Disposition:   Status is: Observation  The patient will require care spanning > 2 midnights and should be moved to inpatient because: Ongoing diagnostic testing needed not appropriate for outpatient work up  Dispo: The patient is from: Home              Anticipated d/c is to: Home              Patient currently is not medically stable to d/c.   Difficult to place patient No       Consultants:  neutology  Subjective:  No nausea, no vomiting, she did report some vertigo/dizziness today, as  well she reports some headache.  Objective: Vitals:   02/07/21 2200 02/08/21 0000 02/08/21 0334 02/08/21 0710  BP: (!) 156/85 (!) 142/69 (!) 145/70 (!) 165/71  Pulse:  (!) 58 (!) 55   Resp: 20 18 18 11   Temp: 98.9 F (37.2 C) 98 F (36.7 C) 98.3 F (36.8 C) 98.2 F (36.8 C)  TempSrc: Oral Oral Oral Oral  SpO2: 97% 97%    Weight:      Height:       No intake or output data in the 24 hours ending 02/08/21 1310 Filed Weights   02/06/21 2106  Weight: 84.1 kg    Examination:  General exam: Appears calm and comfortable   Respiratory system: Clear to auscultation. Respiratory effort normal. Cardiovascular system: S1 & S2 heard, RRR. No JVD, murmurs, rubs, gallops or clicks. No pedal edema. Gastrointestinal system: Abdomen is nondistended, soft and nontender. No organomegaly or masses felt. Normal bowel sounds heard. Central nervous system: Alert and oriented. No focal neurological deficits. Extremities: Symmetric 5 x 5 power. Skin: No rashes, lesions or ulcers Psychiatry: Judgement and insight appear normal. Mood & affect appropriate.     Data Reviewed: I have personally reviewed following labs and imaging studies  CBC: Recent Labs  Lab 02/06/21 1348  WBC 6.1  NEUTROABS 2.9  HGB 13.0  HCT 38.7  MCV 77.6*  PLT 161    Basic Metabolic Panel: Recent Labs  Lab 02/06/21 1348 02/07/21 0047  NA 133* 135  K 4.1 3.8  CL 101 103  CO2 25 24  GLUCOSE 291* 164*  BUN 21 23  CREATININE 0.87 1.11*  CALCIUM 9.9 9.9    GFR: Estimated Creatinine Clearance: 56.5 mL/min (A) (by C-G formula based on SCr of 1.11 mg/dL (H)).  Liver Function Tests: No results for input(s): AST, ALT, ALKPHOS, BILITOT, PROT, ALBUMIN in the last 168 hours.  CBG: Recent Labs  Lab 02/06/21 1342 02/07/21 1805 02/08/21 0041 02/08/21 0700 02/08/21 1201  GLUCAP 286* 370* 190* 183* 184*     Recent Results (from the past 240 hour(s))  Resp Panel by RT-PCR (Flu A&B, Covid) Nasopharyngeal Swab     Status: None   Collection Time: 02/07/21 12:59 AM   Specimen: Nasopharyngeal Swab; Nasopharyngeal(NP) swabs in vial transport medium  Result Value Ref Range Status   SARS Coronavirus 2 by RT PCR NEGATIVE NEGATIVE Final    Comment: (NOTE) SARS-CoV-2 target nucleic acids are NOT DETECTED.  The SARS-CoV-2 RNA is generally detectable in upper respiratory specimens during the acute phase of infection. The lowest concentration of SARS-CoV-2 viral copies this assay can detect is 138 copies/mL. A negative result does not preclude  SARS-Cov-2 infection and should not be used as the sole basis for treatment or other patient management decisions. A negative result may occur with  improper specimen collection/handling, submission of specimen other than nasopharyngeal swab, presence of viral mutation(s) within the areas targeted by this assay, and inadequate number of viral copies(<138 copies/mL). A negative result must be combined with clinical observations, patient history, and epidemiological information. The expected result is Negative.  Fact Sheet for Patients:  EntrepreneurPulse.com.au  Fact Sheet for Healthcare Providers:  IncredibleEmployment.be  This test is no t yet approved or cleared by the [place] FDA and  has been authorized for detection and/or diagnosis of SARS-CoV-2 by FDA under an Emergency Use Authorization (EUA). This EUA will remain  in effect (meaning this test can be used) for the duration of the COVID-19 declaration under Section 564(b)(1)  of the Act, 21 U.S.C.section 360bbb-3(b)(1), unless the authorization is terminated  or revoked sooner.       Influenza A by PCR NEGATIVE NEGATIVE Final   Influenza B by PCR NEGATIVE NEGATIVE Final    Comment: (NOTE) The Xpert Xpress SARS-CoV-2/FLU/RSV plus assay is intended as an aid in the diagnosis of influenza from Nasopharyngeal swab specimens and should not be used as a sole basis for treatment. Nasal washings and aspirates are unacceptable for Xpert Xpress SARS-CoV-2/FLU/RSV testing.  Fact Sheet for Patients: EntrepreneurPulse.com.au  Fact Sheet for Healthcare Providers: IncredibleEmployment.be  This test is not yet approved or cleared by the [place] FDA and has been authorized for detection and/or diagnosis of SARS-CoV-2 by FDA under an Emergency Use Authorization (EUA). This EUA will remain in effect (meaning this test can be used) for the duration of  the COVID-19 declaration under Section 564(b)(1) of the Act, 21 U.S.C. section 360bbb-3(b)(1), unless the authorization is terminated or revoked.  Performed at [facility], [address], [place], [place] [address]          Radiology Studies: MR ANGIO HEAD WO CONTRAST  Result Date: 02/07/2021 CLINICAL DATA:  Stroke, follow-up. EXAM: MRI HEAD WITHOUT CONTRAST MRA HEAD WITHOUT CONTRAST TECHNIQUE: Multiplanar, multi-echo pulse sequences of the brain and surrounding structures were acquired without intravenous contrast. Angiographic images of the Circle of Willis were acquired using MRA technique without intravenous contrast. COMPARISON:  Brain MRI 04/09/2020.  MRA head 05/11/2010. FINDINGS: MRI HEAD FINDINGS Brain: Mild intermittent motion degradation. Two acute/early subacute infarcts within the left pons measuring up to 8 mm (series 5, image 68) (series 5, image 64). Mild multifocal T2 FLAIR hyperintense signal abnormality within the cerebral white matter, nonspecific but compatible with chronic small vessel ischemic disease. Redemonstrated punctate chronic microhemorrhage within the right thalamus. Redemonstrated tiny chronic lacunar infarct within the left cerebellar hemisphere. No evidence of an intracranial mass. No extra-axial fluid collection. No midline shift. Vascular: Reported below. Skull and upper cervical spine: No focal suspicious marrow lesion. Sinuses/Orbits: Visualized orbits show no acute finding. Trace bilateral ethmoid sinus mucosal thickening. Other: Incompletely imaged and incompletely assessed cervical lymphadenopathy. MRA HEAD FINDINGS Anterior circulation: The intracranial internal carotid arteries are patent. Progressive atherosclerotic irregularity of both vessels. There is up to moderate stenosis within the supraclinoid right ICA. Otherwise, no more than mild narrowing of the intracranial internal carotid arteries. The M1 middle cerebral arteries are patent. No  M2 proximal branch occlusion is identified. Progressive moderate stenosis of the proximal M1 right MCA. Progressive moderate/severe stenosis within the distal M1 right MCA. Redemonstrated severe stenosis within an inferior division proximal M2 right MCA vessel. New moderate/severe stenosis within the superior division proximal M2 left MCA vessel. Redemonstrated moderate/severe stenosis within the inferior division proximal left M2 MCA vessel. The anterior cerebral arteries are patent. Progressive moderate/severe stenosis within the proximal right ACA. 2 mm inferiorly projecting vascular protrusion arising from the cavernous left ICA, which may reflect atherosclerotic lobulation or a small aneurysm (series 1019, image 5). Posterior circulation: New from the prior MRA head of 05/10/2010, the distal V4 right vertebral artery appears occluded. Also new from this prior exam, there is near occlusive stenosis versus occlusion of the V4 left vertebral artery just beyond the left PICA origin. Redemonstrated severe stenosis of the distal V4 left vertebral artery. The basilar artery is patent, but somewhat diminutive. The posterior cerebral arteries are patent. Progressive severe stenosis of the P1 left PCA. Progressive moderate/severe stenosis within the  left PCA at the P2/P3 junction. Progressive multifocal mild-to-moderate atherosclerotic irregularity of the right PCA. New severe stenosis within the left posterior communicating artery. The right posterior communicating artery is diminutive or absent. Anatomic variants: As described. MRI brain impression #1 and MRA head impression #1 will be called to the ordering clinician or representative by the Radiologist Assistant, and communication documented in the PACS or [employer]. IMPRESSION: MRI brain: 1. Two acute/early subacute infarcts within the left pons measuring up to 8 mm. 2. Otherwise stable noncontrast MRI appearance of the brain as compared to 04/09/2020. 3.  Mild chronic small vessel ischemic changes within the cerebral white matter. 4. Small chronic lacunar infarct within the left cerebellar hemisphere. 5. Incompletely imaged and incompletely assessed cervical lymphadenopathy. MRA head: 1. New from the prior MRA head of 05/10/2010, the distal V4 right vertebral artery appears occluded. Also new, there is near-occlusive stenosis versus occlusion of the V4 left vertebral artery (just beyond the left PICA origin). Redemonstrated severe stenosis within the distal V4 left vertebral artery. The basilar artery is patent, but somewhat diminutive. 2. Progressive atherosclerotic disease elsewhere, as outlined and with findings most notably as follows. 3. Progressive severe stenosis within the P1 left PCA. Progressive moderate/severe stenosis within the left PCA at the P2/P3 junction. 4. New severe stenosis within the left posterior communicating artery. 5. Progressive moderate stenosis of the supraclinoid right ICA. 6. Progressive moderate stenosis of the proximal M1 right MCA. Progressive moderate/severe stenosis within the distal M1 right MCA. 7. Redemonstrated severe stenosis within an inferior division proximal M2 right MCA vessel. 8. New moderate/severe stenosis within a superior division proximal M2 left MCA vessel. 9. Redemonstrated moderate/severe stenosis within an inferior division proximal M2 left MCA vessel. 10. Progressive moderate/severe stenosis within the proximal A1 right ACA. 11. 2 mm inferiorly projecting vascular protrusion arising from the cavernous left ICA, which may reflect atherosclerotic lobulation or a small aneurysm. This finding was not definitively present on the prior MRA. Electronically Signed   By: Kellie Simmering D.O.   On: 02/07/2021 21:06   MR BRAIN WO CONTRAST  Result Date: 02/07/2021 CLINICAL DATA:  Stroke, follow-up. EXAM: MRI HEAD WITHOUT CONTRAST MRA HEAD WITHOUT CONTRAST TECHNIQUE: Multiplanar, multi-echo pulse sequences of the brain  and surrounding structures were acquired without intravenous contrast. Angiographic images of the Circle of Willis were acquired using MRA technique without intravenous contrast. COMPARISON:  Brain MRI 04/09/2020.  MRA head 05/11/2010. FINDINGS: MRI HEAD FINDINGS Brain: Mild intermittent motion degradation. Two acute/early subacute infarcts within the left pons measuring up to 8 mm (series 5, image 68) (series 5, image 64). Mild multifocal T2 FLAIR hyperintense signal abnormality within the cerebral white matter, nonspecific but compatible with chronic small vessel ischemic disease. Redemonstrated punctate chronic microhemorrhage within the right thalamus. Redemonstrated tiny chronic lacunar infarct within the left cerebellar hemisphere. No evidence of an intracranial mass. No extra-axial fluid collection. No midline shift. Vascular: Reported below. Skull and upper cervical spine: No focal suspicious marrow lesion. Sinuses/Orbits: Visualized orbits show no acute finding. Trace bilateral ethmoid sinus mucosal thickening. Other: Incompletely imaged and incompletely assessed cervical lymphadenopathy. MRA HEAD FINDINGS Anterior circulation: The intracranial internal carotid arteries are patent. Progressive atherosclerotic irregularity of both vessels. There is up to moderate stenosis within the supraclinoid right ICA. Otherwise, no more than mild narrowing of the intracranial internal carotid arteries. The M1 middle cerebral arteries are patent. No M2 proximal branch occlusion is identified. Progressive moderate stenosis of the proximal M1 right MCA. Progressive moderate/severe  stenosis within the distal M1 right MCA. Redemonstrated severe stenosis within an inferior division proximal M2 right MCA vessel. New moderate/severe stenosis within the superior division proximal M2 left MCA vessel. Redemonstrated moderate/severe stenosis within the inferior division proximal left M2 MCA vessel. The anterior cerebral arteries  are patent. Progressive moderate/severe stenosis within the proximal right ACA. 2 mm inferiorly projecting vascular protrusion arising from the cavernous left ICA, which may reflect atherosclerotic lobulation or a small aneurysm (series 1019, image 5). Posterior circulation: New from the prior MRA head of 05/10/2010, the distal V4 right vertebral artery appears occluded. Also new from this prior exam, there is near occlusive stenosis versus occlusion of the V4 left vertebral artery just beyond the left PICA origin. Redemonstrated severe stenosis of the distal V4 left vertebral artery. The basilar artery is patent, but somewhat diminutive. The posterior cerebral arteries are patent. Progressive severe stenosis of the P1 left PCA. Progressive moderate/severe stenosis within the left PCA at the P2/P3 junction. Progressive multifocal mild-to-moderate atherosclerotic irregularity of the right PCA. New severe stenosis within the left posterior communicating artery. The right posterior communicating artery is diminutive or absent. Anatomic variants: As described. MRI brain impression #1 and MRA head impression #1 will be called to the ordering clinician or representative by the Radiologist Assistant, and communication documented in the PACS or [employer]. IMPRESSION: MRI brain: 1. Two acute/early subacute infarcts within the left pons measuring up to 8 mm. 2. Otherwise stable noncontrast MRI appearance of the brain as compared to 04/09/2020. 3. Mild chronic small vessel ischemic changes within the cerebral white matter. 4. Small chronic lacunar infarct within the left cerebellar hemisphere. 5. Incompletely imaged and incompletely assessed cervical lymphadenopathy. MRA head: 1. New from the prior MRA head of 05/10/2010, the distal V4 right vertebral artery appears occluded. Also new, there is near-occlusive stenosis versus occlusion of the V4 left vertebral artery (just beyond the left PICA origin). Redemonstrated  severe stenosis within the distal V4 left vertebral artery. The basilar artery is patent, but somewhat diminutive. 2. Progressive atherosclerotic disease elsewhere, as outlined and with findings most notably as follows. 3. Progressive severe stenosis within the P1 left PCA. Progressive moderate/severe stenosis within the left PCA at the P2/P3 junction. 4. New severe stenosis within the left posterior communicating artery. 5. Progressive moderate stenosis of the supraclinoid right ICA. 6. Progressive moderate stenosis of the proximal M1 right MCA. Progressive moderate/severe stenosis within the distal M1 right MCA. 7. Redemonstrated severe stenosis within an inferior division proximal M2 right MCA vessel. 8. New moderate/severe stenosis within a superior division proximal M2 left MCA vessel. 9. Redemonstrated moderate/severe stenosis within an inferior division proximal M2 left MCA vessel. 10. Progressive moderate/severe stenosis within the proximal A1 right ACA. 11. 2 mm inferiorly projecting vascular protrusion arising from the cavernous left ICA, which may reflect atherosclerotic lobulation or a small aneurysm. This finding was not definitively present on the prior MRA. Electronically Signed   By: Kellie Simmering D.O.   On: 02/07/2021 21:06   ECHOCARDIOGRAM COMPLETE  Result Date: 02/08/2021    ECHOCARDIOGRAM REPORT   Patient Name:   ANNARAE MACNAIR Date of Exam: 02/08/2021 Medical Rec #:  [id number]       Height:       64.5 in Accession #:    [id number]      Weight:       185.5 lb Date of Birth:  1961-04-15       BSA:  1.905 m Patient Age:    49 years        BP:           165/71 mmHg Patient Gender: F               HR:           64 bpm. Exam Location:  Inpatient Procedure: 2D Echo, 3D Echo, Cardiac Doppler, Color Doppler and Intracardiac            Opacification Agent Indications:    Stroke  History:        Patient has prior history of Echocardiogram examinations, most                 recent 05/11/2010.  Risk Factors:Dyslipidemia and Diabetes.  Sonographer:    Roseanna Rainbow RDCS Referring Phys: [id number] JENNIFER YATES  Sonographer Comments: Technically difficult study due to poor echo windows and patient is morbidly obese. Image acquisition challenging due to patient body habitus. IMPRESSIONS  1. Left ventricular ejection fraction, by estimation, is 55 to 60%. The left ventricle has normal function. The left ventricle has no regional wall motion abnormalities. There is mild concentric left ventricular hypertrophy. Left ventricular diastolic parameters are indeterminate.  2. Right ventricular systolic function is normal. The right ventricular size is normal. Tricuspid regurgitation signal is inadequate for assessing PA pressure.  3. Left atrial size was mildly dilated.  4. The mitral valve is normal in structure. Trivial mitral valve regurgitation. No evidence of mitral stenosis.  5. The aortic valve is tricuspid. Aortic valve regurgitation is not visualized. No aortic stenosis is present.  6. There is Moderate (Grade III) protruding plaque involving the descending aorta.  7. The inferior vena cava is normal in size with greater than 50% respiratory variability, suggesting right atrial pressure of 3 mmHg. Comparison(s): No significant change from prior study. Conclusion(s)/Recommendation(s): Otherwise normal echocardiogram, with minor abnormalities described in the report. No intracardiac source of embolism detected on this transthoracic study. A transesophageal echocardiogram is recommended to exclude cardiac source of embolism if clinically indicated. No left ventricular mural or apical thrombus/thrombi. FINDINGS  Left Ventricle: LV thrombus excluded by contrast. Left ventricular ejection fraction, by estimation, is 55 to 60%. The left ventricle has normal function. The left ventricle has no regional wall motion abnormalities. Definity contrast agent was given IV  to delineate the left ventricular endocardial borders. The  left ventricular internal cavity size was normal in size. There is mild concentric left ventricular hypertrophy. Left ventricular diastolic parameters are indeterminate. Right Ventricle: The right ventricular size is normal. No increase in right ventricular wall thickness. Right ventricular systolic function is normal. Tricuspid regurgitation signal is inadequate for assessing PA pressure. Left Atrium: Left atrial size was mildly dilated. Right Atrium: Right atrial size was normal in size. Pericardium: Trivial pericardial effusion is present. Presence of pericardial fat pad. Mitral Valve: The mitral valve is normal in structure. There is mild thickening of the mitral valve leaflet(s). Trivial mitral valve regurgitation. No evidence of mitral valve stenosis. Tricuspid Valve: The tricuspid valve is normal in structure. Tricuspid valve regurgitation is trivial. No evidence of tricuspid stenosis. Aortic Valve: The aortic valve is tricuspid. Aortic valve regurgitation is not visualized. No aortic stenosis is present. Pulmonic Valve: The pulmonic valve was not well visualized. Pulmonic valve regurgitation is not visualized. Aorta: The aortic root, ascending aorta, aortic arch and descending aorta are all structurally normal, with no evidence of dilitation or obstruction. There is moderate (Grade III) protruding  plaque involving the descending aorta. Venous: The inferior vena cava is normal in size with greater than 50% respiratory variability, suggesting right atrial pressure of 3 mmHg. IAS/Shunts: The atrial septum is grossly normal.  LEFT VENTRICLE PLAX 2D LVIDd:         4.40 cm      Diastology LVIDs:         2.90 cm      LV e' medial:    5.44 cm/s LV PW:         1.00 cm      LV E/e' medial:  17.1 LV IVS:        1.10 cm      LV e' lateral:   7.83 cm/s LVOT diam:     2.20 cm      LV E/e' lateral: 11.9 LV SV:         102 LV SV Index:   54 LVOT Area:     3.80 cm                              3D Volume EF: LV Volumes (MOD)             3D EF:        57 % LV vol d, MOD A2C: 117.0 ml LV EDV:       159 ml LV vol d, MOD A4C: 117.0 ml LV ESV:       69 ml LV vol s, MOD A2C: 22.0 ml  LV SV:        90 ml LV vol s, MOD A4C: 41.7 ml LV SV MOD A2C:     95.0 ml LV SV MOD A4C:     117.0 ml LV SV MOD BP:      84.9 ml RIGHT VENTRICLE             IVC RV S prime:     12.00 cm/s  IVC diam: 1.70 cm TAPSE (M-mode): 2.3 cm LEFT ATRIUM             Index        RIGHT ATRIUM           Index LA diam:        3.00 cm 1.57 cm/m   RA Area:     11.50 cm LA Vol (A2C):   60.0 ml 31.50 ml/m  RA Volume:   22.50 ml  11.81 ml/m LA Vol (A4C):   37.6 ml 19.74 ml/m LA Biplane Vol: 50.9 ml 26.72 ml/m  AORTIC VALVE LVOT Vmax:   115.00 cm/s LVOT Vmean:  75.500 cm/s LVOT VTI:    0.269 m  AORTA Ao Root diam: 3.20 cm Ao Asc diam:  3.40 cm MITRAL VALVE MV Area (PHT): 3.03 cm    SHUNTS MV Decel Time: 250 msec    Systemic VTI:  0.27 m MV E velocity: 93.00 cm/s  Systemic Diam: 2.20 cm MV A velocity: 97.40 cm/s MV E/A ratio:  0.95 Buford Dresser MD Electronically signed by Buford Dresser MD Signature Date/Time: 02/08/2021/12:12:28 PM    Final         Scheduled Meds:  atorvastatin  40 mg Oral Daily   clopidogrel  75 mg Oral Daily   enoxaparin (LOVENOX) injection  40 mg Subcutaneous Q24H   feeding supplement (GLUCERNA SHAKE)  237 mL Oral TID BM   FLUoxetine  20 mg Oral Daily   gabapentin  300 mg Oral QHS  insulin aspart  0-15 Units Subcutaneous TID WC   insulin glargine-yfgn  130 Units Subcutaneous Daily   multivitamin with minerals  1 tablet Oral Daily   pantoprazole  40 mg Oral Daily   Ensure Max Protein  11 oz Oral Daily   Continuous Infusions:  sodium chloride 50 mL/hr at 02/07/21 1812     LOS: 0 days     Phillips Climes, MD [facility]   To contact the attending provider between 7A-7P or the covering provider during after hours 7P-7A, please log into the web site [email or web address] and access using universal [place] password  for that web site. If you do not have the password, please call the hospital operator.  02/08/2021, 1:10 PM

## 2021-02-08 NOTE — Progress Notes (Addendum)
Inpatient Diabetes Program Recommendations  AACE/ADA: New Consensus Statement on Inpatient Glycemic Control   Target Ranges:  Prepandial:   less than 140 mg/dL      Peak postprandial:   less than 180 mg/dL (1-2 hours)      Critically ill patients:  140 - 180 mg/dL   Results for [name], [name] (MRN [id number]) as of 02/08/2021 11:53  Ref. Range 02/07/2021 18:05 02/08/2021 00:41 02/08/2021 07:00  Glucose-Capillary Latest Ref Range: 70 - 99 mg/dL 370 (H) 190 (H) 183 (H)   Results for [name], [name] (MRN [id number]) as of 02/08/2021 11:53  Ref. Range 10/09/2019 10:55 02/04/2020 15:19 10/20/2020 16:23 02/08/2021 10:34  Hemoglobin A1C Latest Ref Range: 4.8 - 5.6 % 13.6 (H) 8.9 (A) 10.7 (A) 12.8 (H)   Review of Glycemic Control  Diabetes history: DM2 Outpatient Diabetes medications: Lantus 150 units QAM Current orders for Inpatient glycemic control: Semglee 130 units daily, Novolog 0-15  units TID with meals  Inpatient Diabetes Program Recommendations:    Insulin: Noted patient received Semglee 130 units today. Concerned about hypoglycemia due to high dose of Semglee received. May need to decrease Semglee.  NOTE:  Per chart, patient has Lantus 150 units QAM on home med list. Noted in reviewing chart, patient sees Dr. Loanne Drilling (Endocrinologist) and was last seen 10/20/20. Noted telephone note on 12/24/20 "Pt was supposed to have a procedure today. Sugar was 359. They could not do the colonoscopy. Sugars have been up and down. Blood sugars have  180- 25."  Per H&P, "Patient with incidental finding of possible acute and subacute CVA on 10/5 MRI, presenting with LE numbness/tingling." Patient's glucose was 183 mg/dl this morning and she received Semglee 130 units at 7:53 am today. Concerned about hypoglycemia due to high dose of Semglee given. Will follow along.  Addendum 02/08/21@14 :43-Spoke with patient about diabetes and home regimen for diabetes control. Patient reports being followed by Dr. Loanne Drilling  (Endocrinologist) for diabetes management and currently taking Lantus 150 units daily as an outpatient for diabetes control. Patient reports taking Lantus as prescribed but notes that she sometimes runs out of Lantus (she has medication assistance through insulin manufacture and gets 90 day supply at a time). She states she tries to call early to get it refilled but she does run out at times. Patient states she got her last refill about 2 weeks ago but she reports she had not ran out this time. Patient states she does not intentionally skip taking the Lantus. Patient reports that she administer the insulin in her abdomen doing 2 injections (since insulin pen only gives up to 80 units at a time). Examined patient's abdomen and no lumps or hardened areas noted. Patient states she is rotating sites on abdomen.  Patient reports that she has been on other oral DM medications in the past and she was given sample of Trulicity by PCP in the past.  Patient state she tolerated the Trulicity but it was never prescribed for her.  Discussed A1C results (12.8% on 02/08/21) and explained that current A1C indicates an average glucose of 321 mg/dl over the past 2-3 months. Discussed glucose and A1C goals. Discussed importance of checking CBGs and maintaining good CBG control to prevent long-term and short-term complications.  Stressed to the patient the importance of improving glycemic control to prevent further complications from uncontrolled diabetes. Asked about any prior use of continuous glucose monitoring sensors and patient states she has never had one but she would definitely be interested in getting one. Patient  states she has a follow up appointment with Dr. Loanne Drilling for tomorrow 02/09/21 and she is planning to call an get the appointment changed. Asked that she go ahead and call to get new appointment. Patient reports that she gets symptoms of hypoglycemia when glucose gets low. Encouraged patient to call nursing staff if  she starts to feel like her glucose is low so it can be treated.  Patient verbalized understanding of information discussed and reports no further questions at this time related to diabetes.  Thanks, Barnie Alderman, RN, MSN, CDE Diabetes Coordinator Inpatient Diabetes Program [phone] (Team Pager from 8am to 5pm)

## 2021-02-08 NOTE — Plan of Care (Signed)
  Problem: Education: Goal: Knowledge of General Education information will improve Description: Including pain rating scale, medication(s)/side effects and non-pharmacologic comfort measures Outcome: Progressing   Problem: Health Behavior/Discharge Planning: Goal: Ability to manage health-related needs will improve Outcome: Progressing   Problem: Clinical Measurements: Goal: Ability to maintain clinical measurements within normal limits will improve Outcome: Progressing Goal: Will remain free from infection Outcome: Progressing   Problem: Activity: Goal: Risk for activity intolerance will decrease Outcome: Progressing   Problem: Pain Managment: Goal: General experience of comfort will improve Outcome: Progressing   Problem: Safety: Goal: Ability to remain free from injury will improve Outcome: Progressing

## 2021-02-08 NOTE — Progress Notes (Signed)
STROKE TEAM PROGRESS NOTE   INTERVAL HISTORY No acute events. Patient reports neuropathy x one year. Describes as stocking distribution impairment bilaterally. Came to the hospital because her feet got worse lately and also reporting  she has had double vision, dizziness, off balance, slurred speech episodes since August. She was treated as vertigo initially. Then sent to Dr. Redmond Baseman who arranged MRI brain.  On plavix at home and reports compliance.  We discussed her old stroke seen on imaging, neuropathy and current concerns. Her work up, diagnosis and plan of care was explained. Her questions were answered.   Vitals:   02/07/21 2000 02/07/21 2200 02/08/21 0000 02/08/21 0334  BP: (!) 158/87 (!) 156/85 (!) 142/69 (!) 145/70  Pulse: 78  (!) 58 (!) 55  Resp: 20 20 18 18   Temp: 98.7 F (37.1 C) 98.9 F (37.2 C) 98 F (36.7 C) 98.3 F (36.8 C)  TempSrc: Oral Oral Oral Oral  SpO2: 96% 97% 97%   Weight:      Height:       CBC:  Recent Labs  Lab 02/06/21 1348  WBC 6.1  NEUTROABS 2.9  HGB 13.0  HCT 38.7  MCV 77.6*  PLT 702   Basic Metabolic Panel:  Recent Labs  Lab 02/06/21 1348 02/07/21 0047  NA 133* 135  K 4.1 3.8  CL 101 103  CO2 25 24  GLUCOSE 291* 164*  BUN 21 23  CREATININE 0.87 1.11*  CALCIUM 9.9 9.9   Lipid Panel:  Recent Labs  Lab 02/08/21 0351  CHOL 223*  TRIG 93  HDL 35*  CHOLHDL 6.4  VLDL 19  LDLCALC 169*   HgbA1c: No results for input(s): HGBA1C in the last 168 hours. Urine Drug Screen:  Recent Labs  Lab 02/08/21 0532  LABOPIA NONE DETECTED  COCAINSCRNUR NONE DETECTED  LABBENZ NONE DETECTED  AMPHETMU NONE DETECTED  THCU NONE DETECTED  LABBARB NONE DETECTED    Alcohol Level No results for input(s): ETH in the last 168 hours.  IMAGING past 24 hours MR ANGIO HEAD WO CONTRAST  Result Date: 02/07/2021 CLINICAL DATA:  Stroke, follow-up. EXAM: MRI HEAD WITHOUT CONTRAST MRA HEAD WITHOUT CONTRAST TECHNIQUE: Multiplanar, multi-echo pulse sequences  of the brain and surrounding structures were acquired without intravenous contrast. Angiographic images of the Circle of Willis were acquired using MRA technique without intravenous contrast. COMPARISON:  Brain MRI 04/09/2020.  MRA head 05/11/2010. FINDINGS: MRI HEAD FINDINGS Brain: Mild intermittent motion degradation. Two acute/early subacute infarcts within the left pons measuring up to 8 mm (series 5, image 68) (series 5, image 64). Mild multifocal T2 FLAIR hyperintense signal abnormality within the cerebral white matter, nonspecific but compatible with chronic small vessel ischemic disease. Redemonstrated punctate chronic microhemorrhage within the right thalamus. Redemonstrated tiny chronic lacunar infarct within the left cerebellar hemisphere. No evidence of an intracranial mass. No extra-axial fluid collection. No midline shift. Vascular: Reported below. Skull and upper cervical spine: No focal suspicious marrow lesion. Sinuses/Orbits: Visualized orbits show no acute finding. Trace bilateral ethmoid sinus mucosal thickening. Other: Incompletely imaged and incompletely assessed cervical lymphadenopathy. MRA HEAD FINDINGS Anterior circulation: The intracranial internal carotid arteries are patent. Progressive atherosclerotic irregularity of both vessels. There is up to moderate stenosis within the supraclinoid right ICA. Otherwise, no more than mild narrowing of the intracranial internal carotid arteries. The M1 middle cerebral arteries are patent. No M2 proximal branch occlusion is identified. Progressive moderate stenosis of the proximal M1 right MCA. Progressive moderate/severe stenosis within the distal M1  right MCA. Redemonstrated severe stenosis within an inferior division proximal M2 right MCA vessel. New moderate/severe stenosis within the superior division proximal M2 left MCA vessel. Redemonstrated moderate/severe stenosis within the inferior division proximal left M2 MCA vessel. The anterior  cerebral arteries are patent. Progressive moderate/severe stenosis within the proximal right ACA. 2 mm inferiorly projecting vascular protrusion arising from the cavernous left ICA, which may reflect atherosclerotic lobulation or a small aneurysm (series 1019, image 5). Posterior circulation: New from the prior MRA head of 05/10/2010, the distal V4 right vertebral artery appears occluded. Also new from this prior exam, there is near occlusive stenosis versus occlusion of the V4 left vertebral artery just beyond the left PICA origin. Redemonstrated severe stenosis of the distal V4 left vertebral artery. The basilar artery is patent, but somewhat diminutive. The posterior cerebral arteries are patent. Progressive severe stenosis of the P1 left PCA. Progressive moderate/severe stenosis within the left PCA at the P2/P3 junction. Progressive multifocal mild-to-moderate atherosclerotic irregularity of the right PCA. New severe stenosis within the left posterior communicating artery. The right posterior communicating artery is diminutive or absent. Anatomic variants: As described. MRI brain impression #1 and MRA head impression #1 will be called to the ordering clinician or representative by the Radiologist Assistant, and communication documented in the PACS or [employer]. IMPRESSION: MRI brain: 1. Two acute/early subacute infarcts within the left pons measuring up to 8 mm. 2. Otherwise stable noncontrast MRI appearance of the brain as compared to 04/09/2020. 3. Mild chronic small vessel ischemic changes within the cerebral white matter. 4. Small chronic lacunar infarct within the left cerebellar hemisphere. 5. Incompletely imaged and incompletely assessed cervical lymphadenopathy. MRA head: 1. New from the prior MRA head of 05/10/2010, the distal V4 right vertebral artery appears occluded. Also new, there is near-occlusive stenosis versus occlusion of the V4 left vertebral artery (just beyond the left PICA origin).  Redemonstrated severe stenosis within the distal V4 left vertebral artery. The basilar artery is patent, but somewhat diminutive. 2. Progressive atherosclerotic disease elsewhere, as outlined and with findings most notably as follows. 3. Progressive severe stenosis within the P1 left PCA. Progressive moderate/severe stenosis within the left PCA at the P2/P3 junction. 4. New severe stenosis within the left posterior communicating artery. 5. Progressive moderate stenosis of the supraclinoid right ICA. 6. Progressive moderate stenosis of the proximal M1 right MCA. Progressive moderate/severe stenosis within the distal M1 right MCA. 7. Redemonstrated severe stenosis within an inferior division proximal M2 right MCA vessel. 8. New moderate/severe stenosis within a superior division proximal M2 left MCA vessel. 9. Redemonstrated moderate/severe stenosis within an inferior division proximal M2 left MCA vessel. 10. Progressive moderate/severe stenosis within the proximal A1 right ACA. 11. 2 mm inferiorly projecting vascular protrusion arising from the cavernous left ICA, which may reflect atherosclerotic lobulation or a small aneurysm. This finding was not definitively present on the prior MRA. Electronically Signed   By: Kellie Simmering D.O.   On: 02/07/2021 21:06   MR BRAIN WO CONTRAST  Result Date: 02/07/2021 CLINICAL DATA:  Stroke, follow-up. EXAM: MRI HEAD WITHOUT CONTRAST MRA HEAD WITHOUT CONTRAST TECHNIQUE: Multiplanar, multi-echo pulse sequences of the brain and surrounding structures were acquired without intravenous contrast. Angiographic images of the Circle of Willis were acquired using MRA technique without intravenous contrast. COMPARISON:  Brain MRI 04/09/2020.  MRA head 05/11/2010. FINDINGS: MRI HEAD FINDINGS Brain: Mild intermittent motion degradation. Two acute/early subacute infarcts within the left pons measuring up to 8 mm (series 5, image  68) (series 5, image 64). Mild multifocal T2 FLAIR hyperintense  signal abnormality within the cerebral white matter, nonspecific but compatible with chronic small vessel ischemic disease. Redemonstrated punctate chronic microhemorrhage within the right thalamus. Redemonstrated tiny chronic lacunar infarct within the left cerebellar hemisphere. No evidence of an intracranial mass. No extra-axial fluid collection. No midline shift. Vascular: Reported below. Skull and upper cervical spine: No focal suspicious marrow lesion. Sinuses/Orbits: Visualized orbits show no acute finding. Trace bilateral ethmoid sinus mucosal thickening. Other: Incompletely imaged and incompletely assessed cervical lymphadenopathy. MRA HEAD FINDINGS Anterior circulation: The intracranial internal carotid arteries are patent. Progressive atherosclerotic irregularity of both vessels. There is up to moderate stenosis within the supraclinoid right ICA. Otherwise, no more than mild narrowing of the intracranial internal carotid arteries. The M1 middle cerebral arteries are patent. No M2 proximal branch occlusion is identified. Progressive moderate stenosis of the proximal M1 right MCA. Progressive moderate/severe stenosis within the distal M1 right MCA. Redemonstrated severe stenosis within an inferior division proximal M2 right MCA vessel. New moderate/severe stenosis within the superior division proximal M2 left MCA vessel. Redemonstrated moderate/severe stenosis within the inferior division proximal left M2 MCA vessel. The anterior cerebral arteries are patent. Progressive moderate/severe stenosis within the proximal right ACA. 2 mm inferiorly projecting vascular protrusion arising from the cavernous left ICA, which may reflect atherosclerotic lobulation or a small aneurysm (series 1019, image 5). Posterior circulation: New from the prior MRA head of 05/10/2010, the distal V4 right vertebral artery appears occluded. Also new from this prior exam, there is near occlusive stenosis versus occlusion of the V4  left vertebral artery just beyond the left PICA origin. Redemonstrated severe stenosis of the distal V4 left vertebral artery. The basilar artery is patent, but somewhat diminutive. The posterior cerebral arteries are patent. Progressive severe stenosis of the P1 left PCA. Progressive moderate/severe stenosis within the left PCA at the P2/P3 junction. Progressive multifocal mild-to-moderate atherosclerotic irregularity of the right PCA. New severe stenosis within the left posterior communicating artery. The right posterior communicating artery is diminutive or absent. Anatomic variants: As described. MRI brain impression #1 and MRA head impression #1 will be called to the ordering clinician or representative by the Radiologist Assistant, and communication documented in the PACS or [employer]. IMPRESSION: MRI brain: 1. Two acute/early subacute infarcts within the left pons measuring up to 8 mm. 2. Otherwise stable noncontrast MRI appearance of the brain as compared to 04/09/2020. 3. Mild chronic small vessel ischemic changes within the cerebral white matter. 4. Small chronic lacunar infarct within the left cerebellar hemisphere. 5. Incompletely imaged and incompletely assessed cervical lymphadenopathy. MRA head: 1. New from the prior MRA head of 05/10/2010, the distal V4 right vertebral artery appears occluded. Also new, there is near-occlusive stenosis versus occlusion of the V4 left vertebral artery (just beyond the left PICA origin). Redemonstrated severe stenosis within the distal V4 left vertebral artery. The basilar artery is patent, but somewhat diminutive. 2. Progressive atherosclerotic disease elsewhere, as outlined and with findings most notably as follows. 3. Progressive severe stenosis within the P1 left PCA. Progressive moderate/severe stenosis within the left PCA at the P2/P3 junction. 4. New severe stenosis within the left posterior communicating artery. 5. Progressive moderate stenosis of the  supraclinoid right ICA. 6. Progressive moderate stenosis of the proximal M1 right MCA. Progressive moderate/severe stenosis within the distal M1 right MCA. 7. Redemonstrated severe stenosis within an inferior division proximal M2 right MCA vessel. 8. New moderate/severe stenosis within a superior division  proximal M2 left MCA vessel. 9. Redemonstrated moderate/severe stenosis within an inferior division proximal M2 left MCA vessel. 10. Progressive moderate/severe stenosis within the proximal A1 right ACA. 11. 2 mm inferiorly projecting vascular protrusion arising from the cavernous left ICA, which may reflect atherosclerotic lobulation or a small aneurysm. This finding was not definitively present on the prior MRA. Electronically Signed   By: Kellie Simmering D.O.   On: 02/07/2021 21:06    PHYSICAL EXAM Pleasant mildly obese middle-aged African-American lady not in distress. . Afebrile. Head is nontraumatic. Neck is supple without bruit.    Cardiac exam no murmur or gallop. Lungs are clear to auscultation. Distal pulses are well felt.  Neurological Exam : She is awake alert oriented to time place and person.  There is no aphasia apraxia or dysarthria.  Extraocular movements are full range without nystagmus.  There is mild saccadic dysmetria on lateral gaze left greater than right.  No subjective diplopia.  Blinks to threat bilaterally.  Slight right lower facial asymmetry when she smiles.  Tongue midline.  Motor system exam shows no upper or lower extremity drift but diminished fine finger movements on the right of the cervical right upper extremity.  Symmetric lower extremity strength.  Sensation intact bilaterally.  Except hypoesthesia to touch and pinprick from knee down bilaterally and diminished vibration on toes bilaterally.  Position sense is preserved.  Deep tendon reflexes symmetric except ankle jerks are depressed right more than left.  Coordination slow but accurate.  Gait not  tested.  ASSESSMENT/PLAN [name] is a 60 y.o. female with history of  as above who presented for evaluation of bilateral lower extremity sensory deficit described as numbness "feeling like I am wearing a sock to my knees when I am not" and for evaluation of MRI brain obtained outpatient by ENT for patient complaints of dizziness. - Examination reveals patient with stocking glove decreased sensation in bilateral lower extremities and with brief diplopia in downward gaze that is not consistent. She also describes episodes of abnormal sensations traveling across her head down her right face with visual disturbances daily that occur for approximately 15 minutes at a time.   Stroke: 1. Two acute/early subacute infarcts within the left pons measuring up to 8 mm. 2. Otherwise stable noncontrast MRI appearance of the brain as compared to 04/09/2020. 3. Mild chronic small vessel ischemic changes within the cerebral white matter. 4. Small chronic lacunar infarct within the left cerebellar hemisphere. 5. Incompletely imaged and incompletely assessed cervical lymphadenopathy.    Repeat MRI brain in addition to MRA head have been completed: MRI brain:  1. Two acute/early subacute infarcts within the left pons measuring up to 8 mm. 2. Otherwise stable noncontrast MRI appearance of the brain as compared to 04/09/2020. 3. Mild chronic small vessel ischemic changes within the cerebral white matter. 4. Small chronic lacunar infarct within the left cerebellar hemisphere. 5. Incompletely imaged and incompletely assessed cervical lymphadenopathy.   MRA head:  1. New from the prior MRA head of 05/10/2010, the distal V4 right vertebral artery appears occluded. Also new, there is near-occlusive stenosis versus occlusion of the V4 left vertebral artery (just beyond the left PICA origin). Redemonstrated severe stenosis within the distal V4 left vertebral artery. The basilar artery is patent,but  somewhat diminutive. Progressive atherosclerotic disease elsewhere, as outlined and with findings most notably as follows.Progressive severe stenosis within the P1 left PCA. Progressive moderate/severe stenosis within the left PCA at the P2/P3 junction. New severe stenosis within  the left posterior communicating artery.Progressive moderate stenosis of the supraclinoid right ICA. Progressive moderate stenosis of the proximal M1 right MCA. Progressive moderate/severe stenosis within the distal M1 right MCA. Redemonstrated severe stenosis within an inferior division proximal M2 right MCA vessel. 8. New moderate/severe stenosis within a superior division proximal M2 left MCA vessel. 9. Redemonstrated moderate/severe stenosis within an inferior division proximal M2 left MCA vessel. 10. Progressive moderate/severe stenosis within the proximal A1 right ACA. 11. 2 mm inferiorly projecting vascular protrusion arising from the cavernous left ICA, which may reflect atherosclerotic lobulation or a small aneurysm. This finding was not definitively present on the prior MRA. Carotid Doppler  PENDING 2D Echo  1. Left ventricular ejection fraction, by estimation, is 55 to 60%. The  left ventricle has normal function. The left ventricle has no regional  wall motion abnormalities. There is mild concentric left ventricular hypertrophy. Left ventricular diastolic  parameters are indeterminate.   2. Right ventricular systolic function is normal. The right ventricular  size is normal. Tricuspid regurgitation signal is inadequate for assessing  PA pressure.   3. Left atrial size was mildly dilated.   4. The mitral valve is normal in structure. Trivial mitral valve  regurgitation. No evidence of mitral stenosis.   5. The aortic valve is tricuspid. Aortic valve regurgitation is not  visualized. No aortic stenosis is present.   6. There is Moderate (Grade III) protruding plaque involving the  descending aorta.    7. The inferior vena cava is normal in size with greater than 50%  respiratory variability, suggesting right atrial pressure of 3 mmHg.  No shunt noted. No thrombus noted.  LDL 169 HgbA1c 10.7 VTE prophylaxis - recommended    Diet   Diet heart healthy/carb modified Room service appropriate? Yes; Fluid consistency: Thin   On Plavix 75mg  at home with reported compliance.  Recommend DAPT x 3 Months with Plavix 75mg  and ASA 81mg  Therapy recommendations:   Disposition:  Home Neurology follow up 6 weeks  Hypertension Stable Permissive hypertension (OK if < 220/120) but gradually normalize in 5-7 days Long-term BP goal normotensive  Hyperlipidemia Home meds:  Lipitor 40mg   LDL 169, not at goal < 70 High intensity statin: Increase lipitor dose to 80mg , consider checking Liver function  Continue statin at discharge Close PCP follow up       Diabetes type II Controlled HgbA1c 10.7, goal < 7.0 CBGs Recent Labs    02/07/21 1805 02/08/21 0041 02/08/21 0700  GLUCAP 370* 190* 183*    Close PCP follow up and management        Neuropathy Consider gabapentin 100 mg BID and titrate weekly for effect Close PCP follow up  Other Stroke Risk Factors Obesity, Body mass index is 31.35 kg/m., BMI >/= 30 associated with increased stroke risk, recommend weight loss, diet and exercise as appropriate  Hx stroke: 2010 Family hx stroke (+)   Other Active Problems Cervical lymphadenopathy: outpatient   Hospital day # 0 This patient was seen and evaluated with Dr. Leonie Man. He directed the plan of care.  Charlene Brooke, NP-C  STROKE MD NOTE :  Patient reports that she had transient dizziness GERD seen diplopia about 6 weeks ago which has resolved.  Neurological exam is mostly nonfocal.  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  LDL cholesterol is elevated 169 mg percent.  Echocardiogram shows normal ejection fraction with grade 3 descending aortic arch plaque.  Recommend dual antiplatelet therapy aspirin and Plavix for 3 months followed by aspirin alone and aggressive risk factor modification.  Low-dose statin for elevated LDL.  And Topamax 25 mg twice daily for neuropathic pain which is likely from underlying diabetic neuropathy.  Greater than 50% time during this 35-minute visit was spent in counseling and coordination of care about her pontine stroke, intracranial stenosis and neuropathic pain and answering questions.  Discussed with patient and Dr. Emeline Gins. Antony Contras, MD To contact Stroke Continuity provider, please refer to [email or web address] After hours, contact General Neurology

## 2021-02-08 NOTE — Discharge Summary (Signed)
Physician Discharge Summary  [name] CZY:[id number] DOB: 1960-11-11 DOA: 02/06/2021  PCP: [name], [name], MD  Admit date: 02/06/2021 Discharge date: 02/08/2021  Admitted From: Home Disposition:  Home   Recommendations for Outpatient Follow-up:  Follow up with PCP in 1-2 weeks -Patient to continue dual antiplatelet therapy aspirin statin for 25-month then aspirin alone.  Home Health: outpatient PT  Discharge Condition:Stable CODE STATUS:FULL Diet recommendation: Heart Healthy / Carb Modified   Brief/Interim Summary:  [name] is a 60 y.o. female with medical history significant of CVA; DM; Guillain-Barre; HTN; lymphoma; and HLD presenting with LE numbness.   She went to [facility] yesterday for LE edema and had labs done but left due to the long wait.  She then went to [facility] last night with c/o LE numbness.  She had seen ENT for dizziness and MRI was done that showed a punctate area in the superior L pons that could be c/w acute CVA as well as another lesion in the superior L pons that could be a subacute infarct.  Patient presents with complaints of bilateral upper and lower extremity tingling/numbness, her MRI significant for acute/subacute  CVA,.    Acute CVA/stroke - MRI brain Two acute/early subacute infarcts within the left pons measuring up to 8 mm. -the distal V4 right vertebral artery appears occluded. Also new, there is near-occlusive stenosis versus occlusion of the V4 left vertebral artery (just beyond the left PICA origin). Redemonstrated severe stenosis within the distal V4 left vertebral artery. The basilar artery is patent,but somewhat diminutive. -2D echo with a preserved EF 55 to 60%.  Evidence of grade 3 moderate protruding plaque involving the descending aorta. -Neurology input greatly appreciated, recommendation for dual antiplatelet therapy aspirin and Plavix for 90 days then aspirin alone. -LDL is elevated to 169, so atorvastatin has increased to max dose at  80 mg nightly.     Class 1 obesity due to excess calories with body mass index (BMI) of 31.0 to 31.9 in adult -Body mass index is 31.35 kg/m..  -Outpatient PCP/bariatric medicine f/u encouraged   Vertigo -Possibly related to her cerebellar stroke, continue with PT/OT   Diabetic peripheral neuropathy associated with type 2 diabetes mellitus ([place]) - started on Topamax   DM (diabetes mellitus), type 2 with peripheral vascular complications ([place]) -Prior A1c was 10.7, indicating very poor control -Continue with home regiment.     Essential hypertension -Home medication.   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 Irene Mata  Discharge Diagnoses:  Principal Problem:   Abnormal MRI of head Active Problems:   Hyperlipidemia associated with type 2 diabetes mellitus ([place])   Essential hypertension   DM (diabetes mellitus), type 2 with peripheral vascular complications ([place])   Diabetic peripheral neuropathy associated with type 2 diabetes mellitus ([place])   Small lymphocytic lymphoma (H[facility])   Vertigo   Class 1 obesity due to excess calories with body mass index (BMI) of 31.0 to 31.9 in adult   Cerebral thrombosis with cerebral infarction    Discharge Instructions  Discharge Instructions     Discharge instructions   Complete by: As directed    Follow with Primary MD [name], [name], MD in 7 days   Get CBC, CMP,  checked  by Primary MD next visit.    Activity:  As tolerated with Full fall precautions use walker/cane & assistance as needed   Disposition Home    Diet: Heart Healthy /carb modified .  On your next visit with your primary care physician please Get Medicines reviewed and adjusted.   Please request your Prim.MD to go over all  Hospital Tests and Procedure/Radiological results at the follow up, please get all Hospital records sent to your Prim MD by signing hospital release before you go home.   If you experience worsening of your admission symptoms, develop shortness of breath, life threatening emergency, suicidal or homicidal thoughts you must seek medical attention immediately by calling 911 or calling your MD immediately  if symptoms less severe.  You Must read complete instructions/literature along with all the possible adverse reactions/side effects for all the Medicines you take and that have been prescribed to you. Take any new Medicines after you have completely understood and accpet all the possible adverse reactions/side effects.   Do not drive, operating heavy machinery, perform activities at heights, swimming or participation in water activities or provide baby sitting services if your were admitted for syncope or siezures until you have seen by Primary MD or a Neurologist and advised to do so again.  Do not drive when taking Pain medications.    Do not take more than prescribed Pain, Sleep and Anxiety Medications  Special Instructions: If you have smoked or chewed Tobacco  in the last 2 yrs please stop smoking, stop any regular Alcohol  and or any Recreational drug use.  Wear Seat belts while driving.   Please note  You were cared for by a hospitalist during your hospital stay. If you have any questions about your discharge medications or the care you received while you were in the hospital after you are discharged, you can call the unit and asked to speak with the hospitalist on call if the hospitalist that took care of you is not available. Once you are discharged, your primary care physician will handle any further medical issues. Please note that NO REFILLS for any discharge medications will be authorized once you are discharged, as it is imperative that you return to your primary care physician (or  establish a relationship with a primary care physician if you do not have one) for your aftercare needs so that they can reassess your need for medications and monitor your lab values.   Increase activity slowly   Complete by: As directed       Allergies as of 02/08/2021   No Known Allergies      Medication List     TAKE these medications    acetaminophen 500 MG tablet Commonly known as: TYLENOL Take 2 tablets (1,000 mg total) by mouth every 6 (six) hours as needed.   amLODipine 10 MG tablet Commonly known as: NORVASC Take 1 tablet by mouth once daily   aspirin EC 81 MG tablet Take 1 tablet (81 mg total) by mouth daily. Swallow whole.   atorvastatin 80 MG tablet Commonly known as: Lipitor Take 1 tablet (80 mg total) by mouth daily. What changed:  medication strength how much to take   Blood Pressure Monitor Automat Devi 1 Device by Does not apply route daily.   clopidogrel 75 MG tablet Commonly known as: PLAVIX Take 1 tablet (75 mg total) by mouth daily.   FLUoxetine 20 MG capsule Commonly known as: PROZAC Take 1 capsule (20 mg total) by mouth daily.   glucose blood test strip Commonly known as: OneTouch  Verio USE TO CHECK BLOOD SUGAR TWICE A DAY AND PRN   glucose blood test strip Commonly known as: Contour Next Test 1 each by Other route 2 (two) times daily. And lancets 2/day   hydrALAZINE 25 MG tablet Commonly known as: APRESOLINE Take 1 tablet (25 mg total) by mouth 3 (three) times daily. What changed: when to take this   insulin glargine 100 UNIT/ML Solostar Pen Commonly known as: LANTUS Inject 130 Units into the skin every morning. And pen needles 1/day What changed:  how much to take when to take this   lisinopril-hydrochlorothiazide 20-12.5 MG tablet Commonly known as: ZESTORETIC Take 2 tablets by mouth once daily   meclizine 12.5 MG tablet Commonly known as: ANTIVERT Take 1 tablet (12.5 mg total) by mouth every 12 (twelve) hours.    metoprolol succinate 50 MG 24 hr tablet Commonly known as: TOPROL-XL TAKE 1 TABLET BY MOUTH ONCE DAILY WITH  OR  IMMEDIATELY  FOLLOWING  A  MEAL   omeprazole 20 MG capsule Commonly known as: PRILOSEC Take 1 capsule by mouth once daily   ONE TOUCH LANCETS Misc USE TO CHECK BLOOD SUGAR TWICE A DAY AND PRN   topiramate 25 MG tablet Commonly known as: Topamax Take 1 tablet (25 mg total) by mouth 2 (two) times daily.   triamcinolone ointment 0.5 % Commonly known as: KENALOG Apply topically 2 (two) times daily as needed. What changed:  how much to take reasons to take this        Follow-up Information     [name], [name], MD Follow up.   Specialty: Family Medicine Contact information: [place] [address] [phone]                No Known Allergies  Consultations: neurology   Procedures/Studies: MR ANGIO HEAD WO CONTRAST  Result Date: 02/07/2021 CLINICAL DATA:  Stroke, follow-up. EXAM: MRI HEAD WITHOUT CONTRAST MRA HEAD WITHOUT CONTRAST TECHNIQUE: Multiplanar, multi-echo pulse sequences of the brain and surrounding structures were acquired without intravenous contrast. Angiographic images of the Circle of Willis were acquired using MRA technique without intravenous contrast. COMPARISON:  Brain MRI 04/09/2020.  MRA head 05/11/2010. FINDINGS: MRI HEAD FINDINGS Brain: Mild intermittent motion degradation. Two acute/early subacute infarcts within the left pons measuring up to 8 mm (series 5, image 68) (series 5, image 64). Mild multifocal T2 FLAIR hyperintense signal abnormality within the cerebral white matter, nonspecific but compatible with chronic small vessel ischemic disease. Redemonstrated punctate chronic microhemorrhage within the right thalamus. Redemonstrated tiny chronic lacunar infarct within the left cerebellar hemisphere. No evidence of an intracranial mass. No extra-axial fluid collection. No midline shift. Vascular: Reported  below. Skull and upper cervical spine: No focal suspicious marrow lesion. Sinuses/Orbits: Visualized orbits show no acute finding. Trace bilateral ethmoid sinus mucosal thickening. Other: Incompletely imaged and incompletely assessed cervical lymphadenopathy. MRA HEAD FINDINGS Anterior circulation: The intracranial internal carotid arteries are patent. Progressive atherosclerotic irregularity of both vessels. There is up to moderate stenosis within the supraclinoid right ICA. Otherwise, no more than mild narrowing of the intracranial internal carotid arteries. The M1 middle cerebral arteries are patent. No M2 proximal branch occlusion is identified. Progressive moderate stenosis of the proximal M1 right MCA. Progressive moderate/severe stenosis within the distal M1 right MCA. Redemonstrated severe stenosis within an inferior division proximal M2 right MCA vessel. New moderate/severe stenosis within the superior division proximal M2 left MCA vessel. Redemonstrated moderate/severe stenosis within the inferior division proximal left M2 MCA vessel.  The anterior cerebral arteries are patent. Progressive moderate/severe stenosis within the proximal right ACA. 2 mm inferiorly projecting vascular protrusion arising from the cavernous left ICA, which may reflect atherosclerotic lobulation or a small aneurysm (series 1019, image 5). Posterior circulation: New from the prior MRA head of 05/10/2010, the distal V4 right vertebral artery appears occluded. Also new from this prior exam, there is near occlusive stenosis versus occlusion of the V4 left vertebral artery just beyond the left PICA origin. Redemonstrated severe stenosis of the distal V4 left vertebral artery. The basilar artery is patent, but somewhat diminutive. The posterior cerebral arteries are patent. Progressive severe stenosis of the P1 left PCA. Progressive moderate/severe stenosis within the left PCA at the P2/P3 junction. Progressive multifocal  mild-to-moderate atherosclerotic irregularity of the right PCA. New severe stenosis within the left posterior communicating artery. The right posterior communicating artery is diminutive or absent. Anatomic variants: As described. MRI brain impression #1 and MRA head impression #1 will be called to the ordering clinician or representative by the Radiologist Assistant, and communication documented in the PACS or [employer]. IMPRESSION: MRI brain: 1. Two acute/early subacute infarcts within the left pons measuring up to 8 mm. 2. Otherwise stable noncontrast MRI appearance of the brain as compared to 04/09/2020. 3. Mild chronic small vessel ischemic changes within the cerebral white matter. 4. Small chronic lacunar infarct within the left cerebellar hemisphere. 5. Incompletely imaged and incompletely assessed cervical lymphadenopathy. MRA head: 1. New from the prior MRA head of 05/10/2010, the distal V4 right vertebral artery appears occluded. Also new, there is near-occlusive stenosis versus occlusion of the V4 left vertebral artery (just beyond the left PICA origin). Redemonstrated severe stenosis within the distal V4 left vertebral artery. The basilar artery is patent, but somewhat diminutive. 2. Progressive atherosclerotic disease elsewhere, as outlined and with findings most notably as follows. 3. Progressive severe stenosis within the P1 left PCA. Progressive moderate/severe stenosis within the left PCA at the P2/P3 junction. 4. New severe stenosis within the left posterior communicating artery. 5. Progressive moderate stenosis of the supraclinoid right ICA. 6. Progressive moderate stenosis of the proximal M1 right MCA. Progressive moderate/severe stenosis within the distal M1 right MCA. 7. Redemonstrated severe stenosis within an inferior division proximal M2 right MCA vessel. 8. New moderate/severe stenosis within a superior division proximal M2 left MCA vessel. 9. Redemonstrated moderate/severe stenosis  within an inferior division proximal M2 left MCA vessel. 10. Progressive moderate/severe stenosis within the proximal A1 right ACA. 11. 2 mm inferiorly projecting vascular protrusion arising from the cavernous left ICA, which may reflect atherosclerotic lobulation or a small aneurysm. This finding was not definitively present on the prior MRA. Electronically Signed   By: Kellie Simmering D.O.   On: 02/07/2021 21:06   MR BRAIN WO CONTRAST  Result Date: 02/07/2021 CLINICAL DATA:  Stroke, follow-up. EXAM: MRI HEAD WITHOUT CONTRAST MRA HEAD WITHOUT CONTRAST TECHNIQUE: Multiplanar, multi-echo pulse sequences of the brain and surrounding structures were acquired without intravenous contrast. Angiographic images of the Circle of Willis were acquired using MRA technique without intravenous contrast. COMPARISON:  Brain MRI 04/09/2020.  MRA head 05/11/2010. FINDINGS: MRI HEAD FINDINGS Brain: Mild intermittent motion degradation. Two acute/early subacute infarcts within the left pons measuring up to 8 mm (series 5, image 68) (series 5, image 64). Mild multifocal T2 FLAIR hyperintense signal abnormality within the cerebral white matter, nonspecific but compatible with chronic small vessel ischemic disease. Redemonstrated punctate chronic microhemorrhage within the right thalamus. Redemonstrated tiny chronic lacunar  infarct within the left cerebellar hemisphere. No evidence of an intracranial mass. No extra-axial fluid collection. No midline shift. Vascular: Reported below. Skull and upper cervical spine: No focal suspicious marrow lesion. Sinuses/Orbits: Visualized orbits show no acute finding. Trace bilateral ethmoid sinus mucosal thickening. Other: Incompletely imaged and incompletely assessed cervical lymphadenopathy. MRA HEAD FINDINGS Anterior circulation: The intracranial internal carotid arteries are patent. Progressive atherosclerotic irregularity of both vessels. There is up to moderate stenosis within the supraclinoid  right ICA. Otherwise, no more than mild narrowing of the intracranial internal carotid arteries. The M1 middle cerebral arteries are patent. No M2 proximal branch occlusion is identified. Progressive moderate stenosis of the proximal M1 right MCA. Progressive moderate/severe stenosis within the distal M1 right MCA. Redemonstrated severe stenosis within an inferior division proximal M2 right MCA vessel. New moderate/severe stenosis within the superior division proximal M2 left MCA vessel. Redemonstrated moderate/severe stenosis within the inferior division proximal left M2 MCA vessel. The anterior cerebral arteries are patent. Progressive moderate/severe stenosis within the proximal right ACA. 2 mm inferiorly projecting vascular protrusion arising from the cavernous left ICA, which may reflect atherosclerotic lobulation or a small aneurysm (series 1019, image 5). Posterior circulation: New from the prior MRA head of 05/10/2010, the distal V4 right vertebral artery appears occluded. Also new from this prior exam, there is near occlusive stenosis versus occlusion of the V4 left vertebral artery just beyond the left PICA origin. Redemonstrated severe stenosis of the distal V4 left vertebral artery. The basilar artery is patent, but somewhat diminutive. The posterior cerebral arteries are patent. Progressive severe stenosis of the P1 left PCA. Progressive moderate/severe stenosis within the left PCA at the P2/P3 junction. Progressive multifocal mild-to-moderate atherosclerotic irregularity of the right PCA. New severe stenosis within the left posterior communicating artery. The right posterior communicating artery is diminutive or absent. Anatomic variants: As described. MRI brain impression #1 and MRA head impression #1 will be called to the ordering clinician or representative by the Radiologist Assistant, and communication documented in the PACS or [employer]. IMPRESSION: MRI brain: 1. Two acute/early subacute  infarcts within the left pons measuring up to 8 mm. 2. Otherwise stable noncontrast MRI appearance of the brain as compared to 04/09/2020. 3. Mild chronic small vessel ischemic changes within the cerebral white matter. 4. Small chronic lacunar infarct within the left cerebellar hemisphere. 5. Incompletely imaged and incompletely assessed cervical lymphadenopathy. MRA head: 1. New from the prior MRA head of 05/10/2010, the distal V4 right vertebral artery appears occluded. Also new, there is near-occlusive stenosis versus occlusion of the V4 left vertebral artery (just beyond the left PICA origin). Redemonstrated severe stenosis within the distal V4 left vertebral artery. The basilar artery is patent, but somewhat diminutive. 2. Progressive atherosclerotic disease elsewhere, as outlined and with findings most notably as follows. 3. Progressive severe stenosis within the P1 left PCA. Progressive moderate/severe stenosis within the left PCA at the P2/P3 junction. 4. New severe stenosis within the left posterior communicating artery. 5. Progressive moderate stenosis of the supraclinoid right ICA. 6. Progressive moderate stenosis of the proximal M1 right MCA. Progressive moderate/severe stenosis within the distal M1 right MCA. 7. Redemonstrated severe stenosis within an inferior division proximal M2 right MCA vessel. 8. New moderate/severe stenosis within a superior division proximal M2 left MCA vessel. 9. Redemonstrated moderate/severe stenosis within an inferior division proximal M2 left MCA vessel. 10. Progressive moderate/severe stenosis within the proximal A1 right ACA. 11. 2 mm inferiorly projecting vascular protrusion arising from the cavernous  left ICA, which may reflect atherosclerotic lobulation or a small aneurysm. This finding was not definitively present on the prior MRA. Electronically Signed   By: Kellie Simmering D.O.   On: 02/07/2021 21:06   ECHOCARDIOGRAM COMPLETE  Result Date: 02/08/2021     ECHOCARDIOGRAM REPORT   Patient Name:   LARYN VENNING Date of Exam: 02/08/2021 Medical Rec #:  [id number]       Height:       64.5 in Accession #:    [id number]      Weight:       185.5 lb Date of Birth:  Jun 12, 1960       BSA:          1.905 m Patient Age:    50 years        BP:           165/71 mmHg Patient Gender: F               HR:           64 bpm. Exam Location:  Inpatient Procedure: 2D Echo, 3D Echo, Cardiac Doppler, Color Doppler and Intracardiac            Opacification Agent Indications:    Stroke  History:        Patient has prior history of Echocardiogram examinations, most                 recent 05/11/2010. Risk Factors:Dyslipidemia and Diabetes.  Sonographer:    Roseanna Rainbow RDCS Referring Phys: [id number] JENNIFER YATES  Sonographer Comments: Technically difficult study due to poor echo windows and patient is morbidly obese. Image acquisition challenging due to patient body habitus. IMPRESSIONS  1. Left ventricular ejection fraction, by estimation, is 55 to 60%. The left ventricle has normal function. The left ventricle has no regional wall motion abnormalities. There is mild concentric left ventricular hypertrophy. Left ventricular diastolic parameters are indeterminate.  2. Right ventricular systolic function is normal. The right ventricular size is normal. Tricuspid regurgitation signal is inadequate for assessing PA pressure.  3. Left atrial size was mildly dilated.  4. The mitral valve is normal in structure. Trivial mitral valve regurgitation. No evidence of mitral stenosis.  5. The aortic valve is tricuspid. Aortic valve regurgitation is not visualized. No aortic stenosis is present.  6. There is Moderate (Grade III) protruding plaque involving the descending aorta.  7. The inferior vena cava is normal in size with greater than 50% respiratory variability, suggesting right atrial pressure of 3 mmHg. Comparison(s): No significant change from prior study. Conclusion(s)/Recommendation(s): Otherwise normal  echocardiogram, with minor abnormalities described in the report. No intracardiac source of embolism detected on this transthoracic study. A transesophageal echocardiogram is recommended to exclude cardiac source of embolism if clinically indicated. No left ventricular mural or apical thrombus/thrombi. FINDINGS  Left Ventricle: LV thrombus excluded by contrast. Left ventricular ejection fraction, by estimation, is 55 to 60%. The left ventricle has normal function. The left ventricle has no regional wall motion abnormalities. Definity contrast agent was given IV  to delineate the left ventricular endocardial borders. The left ventricular internal cavity size was normal in size. There is mild concentric left ventricular hypertrophy. Left ventricular diastolic parameters are indeterminate. Right Ventricle: The right ventricular size is normal. No increase in right ventricular wall thickness. Right ventricular systolic function is normal. Tricuspid regurgitation signal is inadequate for assessing PA pressure. Left Atrium: Left atrial size was mildly dilated. Right Atrium: Right atrial size was normal  in size. Pericardium: Trivial pericardial effusion is present. Presence of pericardial fat pad. Mitral Valve: The mitral valve is normal in structure. There is mild thickening of the mitral valve leaflet(s). Trivial mitral valve regurgitation. No evidence of mitral valve stenosis. Tricuspid Valve: The tricuspid valve is normal in structure. Tricuspid valve regurgitation is trivial. No evidence of tricuspid stenosis. Aortic Valve: The aortic valve is tricuspid. Aortic valve regurgitation is not visualized. No aortic stenosis is present. Pulmonic Valve: The pulmonic valve was not well visualized. Pulmonic valve regurgitation is not visualized. Aorta: The aortic root, ascending aorta, aortic arch and descending aorta are all structurally normal, with no evidence of dilitation or obstruction. There is moderate (Grade III)  protruding plaque involving the descending aorta. Venous: The inferior vena cava is normal in size with greater than 50% respiratory variability, suggesting right atrial pressure of 3 mmHg. IAS/Shunts: The atrial septum is grossly normal.  LEFT VENTRICLE PLAX 2D LVIDd:         4.40 cm      Diastology LVIDs:         2.90 cm      LV e' medial:    5.44 cm/s LV PW:         1.00 cm      LV E/e' medial:  17.1 LV IVS:        1.10 cm      LV e' lateral:   7.83 cm/s LVOT diam:     2.20 cm      LV E/e' lateral: 11.9 LV SV:         102 LV SV Index:   54 LVOT Area:     3.80 cm                              3D Volume EF: LV Volumes (MOD)            3D EF:        57 % LV vol d, MOD A2C: 117.0 ml LV EDV:       159 ml LV vol d, MOD A4C: 117.0 ml LV ESV:       69 ml LV vol s, MOD A2C: 22.0 ml  LV SV:        90 ml LV vol s, MOD A4C: 41.7 ml LV SV MOD A2C:     95.0 ml LV SV MOD A4C:     117.0 ml LV SV MOD BP:      84.9 ml RIGHT VENTRICLE             IVC RV S prime:     12.00 cm/s  IVC diam: 1.70 cm TAPSE (M-mode): 2.3 cm LEFT ATRIUM             Index        RIGHT ATRIUM           Index LA diam:        3.00 cm 1.57 cm/m   RA Area:     11.50 cm LA Vol (A2C):   60.0 ml 31.50 ml/m  RA Volume:   22.50 ml  11.81 ml/m LA Vol (A4C):   37.6 ml 19.74 ml/m LA Biplane Vol: 50.9 ml 26.72 ml/m  AORTIC VALVE LVOT Vmax:   115.00 cm/s LVOT Vmean:  75.500 cm/s LVOT VTI:    0.269 m  AORTA Ao Root diam: 3.20 cm Ao Asc diam:  3.40 cm MITRAL VALVE MV  Area (PHT): 3.03 cm    SHUNTS MV Decel Time: 250 msec    Systemic VTI:  0.27 m MV E velocity: 93.00 cm/s  Systemic Diam: 2.20 cm MV A velocity: 97.40 cm/s MV E/A ratio:  0.95 Buford Dresser MD Electronically signed by Buford Dresser MD Signature Date/Time: 02/08/2021/12:12:28 PM    Final       Subjective:  She did report some headache earlier today, she reports no acute neurological deficits, still complains of tingling and numbness in bilateral feet. Discharge Exam: Vitals:    02/08/21 0710 02/08/21 1514  BP: (!) 165/71 (!) 149/68  Pulse:  62  Resp: 11 14  Temp: 98.2 F (36.8 C) 98.4 F (36.9 C)  SpO2:  98%   Vitals:   02/08/21 0000 02/08/21 0334 02/08/21 0710 02/08/21 1514  BP: (!) 142/69 (!) 145/70 (!) 165/71 (!) 149/68  Pulse: (!) 58 (!) 55  62  Resp: 18 18 11 14   Temp: 98 F (36.7 C) 98.3 F (36.8 C) 98.2 F (36.8 C) 98.4 F (36.9 C)  TempSrc: Oral Oral Oral Oral  SpO2: 97%   98%  Weight:      Height:        General: Pt is alert, awake, not in acute distress Cardiovascular: RRR, S1/S2 +, no rubs, no gallops Respiratory: CTA bilaterally, no wheezing, no rhonchi Abdominal: Soft, NT, ND, bowel sounds + Extremities: no edema, no cyanosis    The results of significant diagnostics from this hospitalization (including imaging, microbiology, ancillary and laboratory) are listed below for reference.     Microbiology: Recent Results (from the past 240 hour(s))  Resp Panel by RT-PCR (Flu A&B, Covid) Nasopharyngeal Swab     Status: None   Collection Time: 02/07/21 12:59 AM   Specimen: Nasopharyngeal Swab; Nasopharyngeal(NP) swabs in vial transport medium  Result Value Ref Range Status   SARS Coronavirus 2 by RT PCR NEGATIVE NEGATIVE Final    Comment: (NOTE) SARS-CoV-2 target nucleic acids are NOT DETECTED.  The SARS-CoV-2 RNA is generally detectable in upper respiratory specimens during the acute phase of infection. The lowest concentration of SARS-CoV-2 viral copies this assay can detect is 138 copies/mL. A negative result does not preclude SARS-Cov-2 infection and should not be used as the sole basis for treatment or other patient management decisions. A negative result may occur with  improper specimen collection/handling, submission of specimen other than nasopharyngeal swab, presence of viral mutation(s) within the areas targeted by this assay, and inadequate number of viral copies(<138 copies/mL). A negative result must be combined  with clinical observations, patient history, and epidemiological information. The expected result is Negative.  Fact Sheet for Patients:  EntrepreneurPulse.com.au  Fact Sheet for Healthcare Providers:  IncredibleEmployment.be  This test is no t yet approved or cleared by the [place] FDA and  has been authorized for detection and/or diagnosis of SARS-CoV-2 by FDA under an Emergency Use Authorization (EUA). This EUA will remain  in effect (meaning this test can be used) for the duration of the COVID-19 declaration under Section 564(b)(1) of the Act, 21 U.S.C.section 360bbb-3(b)(1), unless the authorization is terminated  or revoked sooner.       Influenza A by PCR NEGATIVE NEGATIVE Final   Influenza B by PCR NEGATIVE NEGATIVE Final    Comment: (NOTE) The Xpert Xpress SARS-CoV-2/FLU/RSV plus assay is intended as an aid in the diagnosis of influenza from Nasopharyngeal swab specimens and should not be used as a sole basis for treatment. Nasal washings and aspirates  are unacceptable for Xpert Xpress SARS-CoV-2/FLU/RSV testing.  Fact Sheet for Patients: EntrepreneurPulse.com.au  Fact Sheet for Healthcare Providers: IncredibleEmployment.be  This test is not yet approved or cleared by the [place] FDA and has been authorized for detection and/or diagnosis of SARS-CoV-2 by FDA under an Emergency Use Authorization (EUA). This EUA will remain in effect (meaning this test can be used) for the duration of the COVID-19 declaration under Section 564(b)(1) of the Act, 21 U.S.C. section 360bbb-3(b)(1), unless the authorization is terminated or revoked.  Performed at [facility], [address] [place], [place] [address]      Labs: BNP (last 3 results) Recent Labs    04/15/20 1318 08/19/20 1545  BNP 89 778*   Basic Metabolic Panel: Recent Labs  Lab 02/06/21 1348 02/07/21 0047  NA  133* 135  K 4.1 3.8  CL 101 103  CO2 25 24  GLUCOSE 291* 164*  BUN 21 23  CREATININE 0.87 1.11*  CALCIUM 9.9 9.9   Liver Function Tests: No results for input(s): AST, ALT, ALKPHOS, BILITOT, PROT, ALBUMIN in the last 168 hours. No results for input(s): LIPASE, AMYLASE in the last 168 hours. No results for input(s): AMMONIA in the last 168 hours. CBC: Recent Labs  Lab 02/06/21 1348  WBC 6.1  NEUTROABS 2.9  HGB 13.0  HCT 38.7  MCV 77.6*  PLT 257   Cardiac Enzymes: Recent Labs  Lab 02/07/21 0047  CKTOTAL 79   BNP: Invalid input(s): POCBNP CBG: Recent Labs  Lab 02/06/21 1342 02/07/21 1805 02/08/21 0041 02/08/21 0700 02/08/21 1201  GLUCAP 286* 370* 190* 183* 184*   D-Dimer No results for input(s): DDIMER in the last 72 hours. Hgb A1c Recent Labs    02/08/21 1034  HGBA1C 12.8*   Lipid Profile Recent Labs    02/08/21 0351  CHOL 223*  HDL 35*  LDLCALC 169*  TRIG 93  CHOLHDL 6.4   Thyroid function studies No results for input(s): TSH, T4TOTAL, T3FREE, THYROIDAB in the last 72 hours.  Invalid input(s): FREET3 Anemia work up No results for input(s): VITAMINB12, FOLATE, FERRITIN, TIBC, IRON, RETICCTPCT in the last 72 hours. Urinalysis    Component Value Date/Time   COLORURINE YELLOW 02/06/2021 1354   APPEARANCEUR CLOUDY (A) 02/06/2021 1354   LABSPEC 1.013 02/06/2021 1354   PHURINE 5.0 02/06/2021 1354   GLUCOSEU >=500 (A) 02/06/2021 1354   HGBUR MODERATE (A) 02/06/2021 1354   HGBUR negative 03/12/2010 0847   BILIRUBINUR NEGATIVE 02/06/2021 1354   BILIRUBINUR n 09/11/2015 [place] 02/06/2021 1354   PROTEINUR >=300 (A) 02/06/2021 1354   UROBILINOGEN 1.0 09/11/2015 1039   UROBILINOGEN 1.0 05/19/2014 1858   NITRITE NEGATIVE 02/06/2021 1354   LEUKOCYTESUR LARGE (A) 02/06/2021 1354   Sepsis Labs Invalid input(s): PROCALCITONIN,  WBC,  LACTICIDVEN Microbiology Recent Results (from the past 240 hour(s))  Resp Panel by RT-PCR (Flu  A&B, Covid) Nasopharyngeal Swab     Status: None   Collection Time: 02/07/21 12:59 AM   Specimen: Nasopharyngeal Swab; Nasopharyngeal(NP) swabs in vial transport medium  Result Value Ref Range Status   SARS Coronavirus 2 by RT PCR NEGATIVE NEGATIVE Final    Comment: (NOTE) SARS-CoV-2 target nucleic acids are NOT DETECTED.  The SARS-CoV-2 RNA is generally detectable in upper respiratory specimens during the acute phase of infection. The lowest concentration of SARS-CoV-2 viral copies this assay can detect is 138 copies/mL. A negative result does not preclude SARS-Cov-2 infection and should not be used as  the sole basis for treatment or other patient management decisions. A negative result may occur with  improper specimen collection/handling, submission of specimen other than nasopharyngeal swab, presence of viral mutation(s) within the areas targeted by this assay, and inadequate number of viral copies(<138 copies/mL). A negative result must be combined with clinical observations, patient history, and epidemiological information. The expected result is Negative.  Fact Sheet for Patients:  EntrepreneurPulse.com.au  Fact Sheet for Healthcare Providers:  IncredibleEmployment.be  This test is no t yet approved or cleared by the [place] FDA and  has been authorized for detection and/or diagnosis of SARS-CoV-2 by FDA under an Emergency Use Authorization (EUA). This EUA will remain  in effect (meaning this test can be used) for the duration of the COVID-19 declaration under Section 564(b)(1) of the Act, 21 U.S.C.section 360bbb-3(b)(1), unless the authorization is terminated  or revoked sooner.       Influenza A by PCR NEGATIVE NEGATIVE Final   Influenza B by PCR NEGATIVE NEGATIVE Final    Comment: (NOTE) The Xpert Xpress SARS-CoV-2/FLU/RSV plus assay is intended as an aid in the diagnosis of influenza from Nasopharyngeal swab specimens  and should not be used as a sole basis for treatment. Nasal washings and aspirates are unacceptable for Xpert Xpress SARS-CoV-2/FLU/RSV testing.  Fact Sheet for Patients: EntrepreneurPulse.com.au  Fact Sheet for Healthcare Providers: IncredibleEmployment.be  This test is not yet approved or cleared by the [place] FDA and has been authorized for detection and/or diagnosis of SARS-CoV-2 by FDA under an Emergency Use Authorization (EUA). This EUA will remain in effect (meaning this test can be used) for the duration of the COVID-19 declaration under Section 564(b)(1) of the Act, 21 U.S.C. section 360bbb-3(b)(1), unless the authorization is terminated or revoked.  Performed at [facility], [address] [place], [place] [address]      Time coordinating discharge: Over 30 minutes  SIGNED:   Phillips Climes, MD  [facility] 02/08/2021, 4:03 PM Pager   If 7PM-7AM, please contact night-coverage [email or web address] Password [email or web address]

## 2021-02-08 NOTE — Plan of Care (Signed)
  Problem: Education: Goal: Knowledge of General Education information will improve Description: Including pain rating scale, medication(s)/side effects and non-pharmacologic comfort measures 02/08/2021 1620 by Bess Harvest, RN Outcome: Adequate for Discharge 02/08/2021 1620 by Bess Harvest, RN Outcome: Adequate for Discharge 02/08/2021 1148 by Bess Harvest, RN Outcome: Progressing   Problem: Health Behavior/Discharge Planning: Goal: Ability to manage health-related needs will improve 02/08/2021 1620 by Bess Harvest, RN Outcome: Adequate for Discharge 02/08/2021 1620 by Bess Harvest, RN Outcome: Adequate for Discharge 02/08/2021 1148 by Bess Harvest, RN Outcome: Progressing   Problem: Clinical Measurements: Goal: Ability to maintain clinical measurements within normal limits will improve 02/08/2021 1620 by Bess Harvest, RN Outcome: Adequate for Discharge 02/08/2021 1620 by Bess Harvest, RN Outcome: Adequate for Discharge 02/08/2021 1148 by Bess Harvest, RN Outcome: Progressing Goal: Will remain free from infection 02/08/2021 1620 by Bess Harvest, RN Outcome: Adequate for Discharge 02/08/2021 1620 by Bess Harvest, RN Outcome: Adequate for Discharge 02/08/2021 1148 by Bess Harvest, RN Outcome: Progressing   Problem: Activity: Goal: Risk for activity intolerance will decrease 02/08/2021 1620 by Bess Harvest, RN Outcome: Adequate for Discharge 02/08/2021 1620 by Bess Harvest, RN Outcome: Adequate for Discharge 02/08/2021 1148 by Bess Harvest, RN Outcome: Progressing   Problem: Pain Managment: Goal: General experience of comfort will improve 02/08/2021 1620 by Bess Harvest, RN Outcome: Adequate for Discharge 02/08/2021 1620 by Bess Harvest, RN Outcome: Adequate for Discharge 02/08/2021 1148 by Bess Harvest, RN Outcome: Progressing   Problem: Safety: Goal: Ability to remain free from injury will  improve 02/08/2021 1620 by Bess Harvest, RN Outcome: Adequate for Discharge 02/08/2021 1620 by Bess Harvest, RN Outcome: Adequate for Discharge 02/08/2021 1148 by Bess Harvest, RN Outcome: Progressing   Problem: Acute Rehab OT Goals (only OT should resolve) Goal: Pt. Will Perform Tub/Shower Transfer Outcome: Adequate for Discharge Goal: OT Additional ADL Goal #1 Outcome: Adequate for Discharge Goal: OT Additional ADL Goal #2 Outcome: Adequate for Discharge Goal: OT Additional ADL Goal #3 Outcome: Adequate for Discharge   Problem: Increased Nutrient Needs (NI-5.1) Goal: Food and/or nutrient delivery Description: Individualized approach for food/nutrient provision. Outcome: Adequate for Discharge   Problem: Acute Rehab PT Goals(only PT should resolve) Goal: Patient Will Transfer Sit To/From Stand Outcome: Adequate for Discharge Goal: Pt Will Ambulate Outcome: Adequate for Discharge Goal: Pt Will Go Up/Down Stairs Outcome: Adequate for Discharge

## 2021-02-08 NOTE — Evaluation (Signed)
Physical Therapy Evaluation Patient Details Name: [name] MRN: [id number] DOB: 1961/03/24 Today's Date: 02/08/2021  History of Present Illness  Pt is a 60 yo female admitted with c/o sensory changes to LEs for last 2 weeks.  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  Pt with PMH of [place], CVAs, DM, HTN.  Clinical Impression  Pt admitted secondary to problem above with deficits below. Pt with episode of dizziness which limited mobility. Pt with mild LOB, requiring min A. Took seated rest and dizziness resolved, but pt reported head felt funny, so returned to supine. VSS. Anticipate pt will progress well and would benefit from neuro outpatient PT at d/c. Reports she has been staying with her sister who can provide support if needed. Will continue to follow acutely.        Recommendations for follow up therapy are one component of a multi-disciplinary discharge planning process, led by the attending physician.  Recommendations may be updated based on patient status, additional functional criteria and insurance authorization.  Follow Up Recommendations Supervision for mobility/OOB;Outpatient PT (neuro outpatient)    Equipment Recommendations  Other (comment) (TBD pending mobility progression)    Recommendations for Other Services       Precautions / Restrictions Precautions Precautions: Fall Restrictions Weight Bearing Restrictions: No      Mobility  Bed Mobility Overal bed mobility: Modified Independent             General bed mobility comments: bed flat with no rails.    Transfers Overall transfer level: Needs assistance Equipment used: None Transfers: Sit to/from Stand Sit to Stand: Supervision         General transfer comment: Supervision for safety.  Ambulation/Gait Ambulation/Gait assistance: Min assist;Min guard Gait Distance (Feet): 10 Feet Assistive device: None Gait Pattern/deviations:  Step-through pattern;Decreased stride length Gait velocity: Decreased   General Gait Details: Min guard A initially, however, pt with episode of dizziness during short distance ambulation and required seated rest for symptoms to resolve. Continued to report that head felt funny so returned to supine. Min A for steadying secondary to mild LOB when pt had dizzy episode.  Stairs            Wheelchair Mobility    Modified Rankin (Stroke Patients Only)       Balance Overall balance assessment: Mild deficits observed, not formally tested                                           Pertinent Vitals/Pain Pain Assessment: Faces Faces Pain Scale: Hurts little more Pain Location: head in between eyes and on R side of head Pain Descriptors / Indicators: Aching Pain Intervention(s): Limited activity within patient's tolerance;Monitored during session;Repositioned    Home Living Family/patient expects to be discharged to:: Private residence Living Arrangements: Other relatives (sister) Available Help at Discharge: Family;Available PRN/intermittently Type of Home: Apartment Home Access: Stairs to enter Entrance Stairs-Rails: Right;Left;Can reach both Entrance Stairs-Number of Steps: flight Home Layout: One level Home Equipment: None Additional Comments: Reports she normally lives alone, but has been staying with her sister since symptoms started.    Prior Function Level of Independence: Independent         Comments: Pt has walked with walker in past but does not currently and not sure she has a walker at home.  Hand Dominance   Dominant Hand: Right    Extremity/Trunk Assessment   Upper Extremity Assessment Upper Extremity Assessment: Defer to OT evaluation    Lower Extremity Assessment Lower Extremity Assessment: Generalized weakness (reports neuropathy in bilateral feet)    Cervical / Trunk Assessment Cervical / Trunk Assessment: Normal   Communication   Communication: No difficulties  Cognition Arousal/Alertness: Awake/alert Behavior During Therapy: WFL for tasks assessed/performed Overall Cognitive Status: Within Functional Limits for tasks assessed                                 General Comments: appears to be at baseline.      General Comments      Exercises     Assessment/Plan    PT Assessment Patient needs continued PT services  PT Problem List Decreased activity tolerance;Decreased balance;Decreased mobility;Decreased knowledge of use of DME       PT Treatment Interventions DME instruction;Gait training;Stair training;Therapeutic activities;Functional mobility training;Balance training;Therapeutic exercise;Patient/family education    PT Goals (Current goals can be found in the Care Plan section)  Acute Rehab PT Goals Patient Stated Goal: to go home PT Goal Formulation: With patient Time For Goal Achievement: 02/22/21 Potential to Achieve Goals: Good    Frequency Min 4X/week   Barriers to discharge        Co-evaluation               AM-PAC PT "6 Clicks" Mobility  Outcome Measure Help needed turning from your back to your side while in a flat bed without using bedrails?: None Help needed moving from lying on your back to sitting on the side of a flat bed without using bedrails?: None Help needed moving to and from a bed to a chair (including a wheelchair)?: A Little Help needed standing up from a chair using your arms (e.g., wheelchair or bedside chair)?: A Little Help needed to walk in hospital room?: A Little Help needed climbing 3-5 steps with a railing? : A Little 6 Click Score: 20    End of Session   Activity Tolerance: Treatment limited secondary to medical complications (Comment) (dizziness) Patient left: in bed;with call bell/phone within reach;with bed alarm set Nurse Communication: Mobility status PT Visit Diagnosis: Unsteadiness on feet (R26.81)    Time:  1610-9604 PT Time Calculation (min) (ACUTE ONLY): 14 min   Charges:   PT Evaluation $PT Eval Moderate Complexity: 1 Mod          Lou Miner, DPT  Acute Rehabilitation Services  Pager: [phone] Office: [phone]   Rudean Hitt 02/08/2021, 2:46 PM

## 2021-02-08 NOTE — TOC Transition Note (Signed)
Transition of Care [facility]) - CM/SW Discharge Note   Patient Details  Name: [name] MRN: [id number] Date of Birth: 1960-12-14  Transition of Care [facility]) CM/SW Contact:  Pollie Friar, RN Phone Number: 02/08/2021, 4:21 PM   Clinical Narrative:    Patient is discharging home with his sister. She has outpatient therapy arranged through [facility]. Orders in Epic and information on the AVS.  Pt states she can stay with her sister for a few days at d/c. Sister or SIL can provide needed transportation. Pt denies issues with home medications. Sister will transport home today.   Final next level of care: OP Rehab Barriers to Discharge: No Barriers Identified   Patient Goals and CMS Choice     Choice offered to / list presented to : Patient  Discharge Placement                       Discharge Plan and Services                                     Social Determinants of Health (SDOH) Interventions     Readmission Risk Interventions No flowsheet data found.

## 2021-02-08 NOTE — Discharge Instructions (Signed)

## 2021-02-08 NOTE — Progress Notes (Signed)
Carotid artery duplex has been completed. Preliminary results can be found in CV Proc through chart review.   02/08/21 4:12 PM [name] RVT

## 2021-02-08 NOTE — Evaluation (Signed)
Occupational Therapy Evaluation Patient Details Name: [name] MRN: [id number] DOB: 03-Mar-1961 Today's Date: 02/08/2021   History of Present Illness Pt is a 60 yo female admitted with c/o sensory changes to LEs for last 2 weeks.  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  Pt with PMH of Guillian Barre, CVAs, DM, HTN.   Clinical Impression   Pt admitted with the above diagnosis and has the deficits listed below. Pt would benefit from continued OT while in the hospital to reach mod I level of care with adls and simulate back to work tasks.  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  Will continue to see acutely.      Recommendations for follow up therapy are one component of a multi-disciplinary discharge planning process, led by the attending physician.  Recommendations may be updated based on patient status, additional functional criteria and insurance authorization.   Follow Up Recommendations  No OT follow up;Supervision - Intermittent    Equipment Recommendations  None recommended by OT    Recommendations for Other Services       Precautions / Restrictions Precautions Precautions: Fall Restrictions Weight Bearing Restrictions: No Other Position/Activity Restrictions: Pt reports she has vertigo symptoms at times but not at present time.      Mobility Bed Mobility Overal bed mobility: Modified Independent             General bed mobility comments: bed flat with no rails.    Transfers Overall transfer level: Needs assistance Equipment used: 1 person hand held assist Transfers: Sit to/from [facility] Sit to Stand: Supervision Stand pivot transfers: Supervision       General transfer comment: No hands on assist required.    Balance Overall balance assessment: Mild deficits observed, not formally tested                                         ADL  either performed or assessed with clinical judgement   ADL Overall ADL's : Needs assistance/impaired Eating/Feeding: Independent;Sitting   Grooming: Wash/dry hands;Wash/dry face;Oral care;Supervision/safety;Standing   Upper Body Bathing: Set up;Sitting   Lower Body Bathing: Supervison/ safety;Sit to/from stand;Cueing for compensatory techniques   Upper Body Dressing : Set up;Sitting   Lower Body Dressing: Supervision/safety;Sit to/from stand;Cueing for compensatory techniques   Toilet Transfer: Supervision/safety;Ambulation;Comfort height toilet;Grab bars Toilet Transfer Details (indicate cue type and reason): Pt walked to bathroom to toilet. Toileting- Clothing Manipulation and Hygiene: Supervision/safety;Sit to/from stand       Functional mobility during ADLs: Min guard General ADL Comments: Pt completed most basic adls in room with supervision and did not use the walker.  Pt does work at a school as a Sports coach.  Pt has to manage steps, bends over a lot and requires good balance.     Vision Baseline Vision/History: 1 Wears glasses Ability to See in Adequate Light: 0 Adequate Patient Visual Report: No change from baseline Vision Assessment?: No apparent visual deficits     Perception Perception Perception Tested?: Yes   Praxis Praxis Praxis tested?: Within functional limits    Pertinent Vitals/Pain Pain Assessment: Faces Faces Pain Scale: Hurts little more Pain Location: head in between eyes.  MD notified. Pain Descriptors / Indicators: Aching Pain Intervention(s): Limited activity within patient's tolerance;Monitored during session     Hand  Dominance Right   Extremity/Trunk Assessment Upper Extremity Assessment Upper Extremity Assessment: Overall WFL for tasks assessed   Lower Extremity Assessment Lower Extremity Assessment: Defer to PT evaluation   Cervical / Trunk Assessment Cervical / Trunk Assessment: Normal   Communication Communication Communication:  No difficulties   Cognition Arousal/Alertness: Awake/alert Behavior During Therapy: WFL for tasks assessed/performed Overall Cognitive Status: Within Functional Limits for tasks assessed                                 General Comments: appears to be at baseline.   General Comments  Pt walked in room without walker but appeared tentative.  Further evaluation needed due to pts work enviroment.    Exercises     Shoulder Instructions      Home Living Family/patient expects to be discharged to:: Private residence Living Arrangements: Alone Available Help at Discharge: Family;Available PRN/intermittently Type of Home: Apartment Home Access: Stairs to enter Entrance Stairs-Number of Steps: 3   Home Layout: One level     Bathroom Shower/Tub: Corporate investment banker: Standard     Home Equipment: None          Prior Functioning/Environment Level of Independence: Independent        Comments: Pt has walked with walker in past but does not currently and not sure she has a walker at home.        OT Problem List: Impaired balance (sitting and/or standing);Pain      OT Treatment/Interventions: Self-care/ADL training;Therapeutic activities;Balance training    OT Goals(Current goals can be found in the care plan section) Acute Rehab OT Goals Patient Stated Goal: to go home OT Goal Formulation: With patient Time For Goal Achievement: 02/22/21 Potential to Achieve Goals: Good ADL Goals Pt Will Perform Tub/Shower Transfer: Tub transfer;with modified independence;ambulating Additional ADL Goal #1: Pt will walk to bathroom and complete all toileting tasks with mod I Additional ADL Goal #2: Pt will gather all clothing in room and dress self without assist. Additional ADL Goal #3: Pt will pick items off floor and empty the trash without a walker with supervision to mimic back to work tasks.  OT Frequency: Min 2X/week   Barriers to D/C: Decreased  caregiver support  pt states she has a sister she can stay with if necessary but would prefer to go home.       Co-evaluation              AM-PAC OT "6 Clicks" Daily Activity     Outcome Measure Help from another person eating meals?: None Help from another person taking care of personal grooming?: None Help from another person toileting, which includes using toliet, bedpan, or urinal?: A Little Help from another person bathing (including washing, rinsing, drying)?: None Help from another person to put on and taking off regular upper body clothing?: None Help from another person to put on and taking off regular lower body clothing?: A Little 6 Click Score: 22   End of Session Nurse Communication: Mobility status  Activity Tolerance: Patient tolerated treatment well Patient left: in chair;with call bell/phone within reach  OT Visit Diagnosis: Unsteadiness on feet (R26.81)                Time: 6237-6283 OT Time Calculation (min): 28 min Charges:  OT General Charges $OT Visit: 1 Visit OT Evaluation $OT Eval Moderate Complexity: 1 Mod OT Treatments $Self Care/Home Management :  8-22 mins  Glenford Peers 02/08/2021, 10:12 AM

## 2021-02-08 NOTE — Progress Notes (Signed)
  Echocardiogram 2D Echocardiogram has been performed.  [name] R 02/08/2021, 11:18 AM

## 2021-02-08 NOTE — Progress Notes (Signed)
Initial Nutrition Assessment  DOCUMENTATION CODES:  Obesity unspecified  INTERVENTION:  Add Glucerna Shake po TID, each supplement provides 220 kcal and 10 grams of protein.  Add Ensure Max po daily, each supplement provides 150 kcal and 30 grams of protein.    Add MVI with minerals daily.  NUTRITION DIAGNOSIS:  Increased nutrient needs related to acute illness as evidenced by estimated needs.  GOAL:  Patient will meet greater than or equal to 90% of their needs  MONITOR:  PO intake, Supplement acceptance, Labs, Weight trends, I & O's  REASON FOR ASSESSMENT:  Consult Assessment of nutrition requirement/status  ASSESSMENT:  60 yo female with a PMH of CVA, T2DM, Guillain-Barre, HTN, lymphoma, and HLD presenting with LE numbness.  Neurology following for work-up.  Spoke with patient at bedside. She reports that she has been eating well at home with no changes in her appetite recently.   She reports that she may have lost 2 lbs since coming into the hospital, but otherwise, she reports no changes to her weight.   Per Epic, pt has lost ~11 lbs (5.7%) in the past 4 months, which is not necessarily significant for the time frame. Weight has been steadily declining over the past year.  Also noted to have some mild edema in BLE.  Recommend adding Glucerna TID, Ensure Max daily, and MVI with minerals daily.  Medications: reviewed; SSI, Semglee, Protonix, NaCl @ 50 ml/hr   Labs: reviewed; CBG 183-370 (H) HbA1c: 10.7% (10/20/2020)  NUTRITION - FOCUSED PHYSICAL EXAM: Flowsheet Row Most Recent Value  Orbital Region Mild depletion  Upper Arm Region No depletion  Thoracic and Lumbar Region No depletion  Buccal Region Mild depletion  Temple Region Mild depletion  Clavicle Bone Region No depletion  Clavicle and Acromion Bone Region No depletion  Scapular Bone Region No depletion  Dorsal Hand No depletion  Patellar Region No depletion  Anterior Thigh Region No depletion   Posterior Calf Region No depletion  Edema (RD Assessment) Mild  [BLE]  Hair Reviewed  Eyes Reviewed  Mouth Reviewed  Skin Reviewed  Nails Reviewed   Diet Order:   Diet Order             Diet heart healthy/carb modified Room service appropriate? Yes; Fluid consistency: Thin  Diet effective now                  EDUCATION NEEDS:  Education needs have been addressed  Skin:  Skin Assessment: Reviewed RN Assessment  Last BM:  02/06/21  Height:  Ht Readings from Last 1 Encounters:  02/06/21 5' 4.5" (1.638 m)   Weight:  Wt Readings from Last 1 Encounters:  02/06/21 84.1 kg   BMI:  Body mass index is 31.35 kg/m.  Estimated Nutritional Needs:  Kcal:  1650-1850 Protein:  90-105 grams Fluid:  >1.7 L  Derrel Nip, RD, LDN (she/her/hers) Registered Dietitian I After-Hours/Weekend Pager # in [place]

## 2021-02-09 ENCOUNTER — Other Ambulatory Visit: Payer: Self-pay

## 2021-02-09 ENCOUNTER — Ambulatory Visit (INDEPENDENT_AMBULATORY_CARE_PROVIDER_SITE_OTHER): Payer: BC Managed Care – PPO | Admitting: Endocrinology

## 2021-02-09 VITALS — BP 150/80 | HR 80 | Ht 64.5 in | Wt 193.2 lb

## 2021-02-09 DIAGNOSIS — E1151 Type 2 diabetes mellitus with diabetic peripheral angiopathy without gangrene: Secondary | ICD-10-CM

## 2021-02-09 LAB — POCT GLYCOSYLATED HEMOGLOBIN (HGB A1C): Hemoglobin A1C: 13.9 % — AB (ref 4.0–5.6)

## 2021-02-09 MED ORDER — FREESTYLE LIBRE 2 SENSOR MISC: 1.0000 | 3 refills | Status: DC

## 2021-02-09 MED ORDER — INSULIN GLARGINE 100 UNIT/ML SOLOSTAR PEN: 160.0000 [IU] | PEN_INJECTOR | SUBCUTANEOUS | 3 refills | Status: DC

## 2021-02-09 MED ORDER — FREESTYLE LIBRE 2 READER DEVI: 1.0000 | Freq: Once | 1 refills | Status: AC

## 2021-02-09 NOTE — Progress Notes (Signed)
Subjective:    Patient ID: [name], female    DOB: 1960/08/28, 60 y.o.   MRN: [id number]  HPI Pt returns for f/u of diabetes mellitus:  DM type: Insulin-requiring type 2.   Dx'ed: 2010.   Complications: CVA and PN Therapy: insulin since soon after dx.  GDM: never.  DKA: never.  Severe hypoglycemia: never.  Pancreatitis: never SDOH: pt gets insulin from mfgr, but she says she gets insulin inconsistently this way.  Other: She requests to take just 1 injection per day, as she is having difficulty remembering the multiple daily injections.  Interval history: She still gets the Lantus from pt assist, and she has not recently missed any doses.  Pt says she does not know anything about how cbg's have been recently, except she thinks it might be low in the middle of the night.   Past Medical History:  Diagnosis Date   Abnormality of gait 05/10/2010   BACK PAIN 11/14/2008   Class 1 obesity due to excess calories with body mass index (BMI) of 31.0 to 31.9 in adult 02/07/2021   DIABETES MELLITUS, TYPE II 07/15/2008   Diplopia 07/15/2008   ECZEMA, ATOPIC 04/03/2009   Guillain-Barre ([place]) 1988   HYPERLIPIDEMIA 03/06/2009   HYPERTENSION 07/15/2008   Stroke ([place]) 2010, 2011   x2    Vertebral artery stenosis     Past Surgical History:  Procedure Laterality Date   ABDOMINAL HYSTERECTOMY     DILATION AND CURETTAGE OF UTERUS     FOOT SURGERY      Social History   Socioeconomic History   Marital status: Single    Spouse name: Not on file   Number of children: Not on file   Years of education: Not on file   Highest education level: Not on file  Occupational History   Occupation: custodian at [employer]	[place]  Tobacco Use   Smoking status: Never   Smokeless tobacco: Never  Vaping Use   Vaping Use: Never used  Substance and Sexual Activity   Alcohol use: No   Drug use: No   Sexual activity: Not on file  Other Topics Concern   Not on file  Social History Narrative    Not on file   Social Determinants of Health   Financial Resource Strain: Not on file  Food Insecurity: Not on file  Transportation Needs: Not on file  Physical Activity: Not on file  Stress: Not on file  Social Connections: Not on file  Intimate Partner Violence: Not on file    Current Outpatient Medications on File Prior to Visit  Medication Sig Dispense Refill   acetaminophen (TYLENOL) 500 MG tablet Take 2 tablets (1,000 mg total) by mouth every 6 (six) hours as needed. 30 tablet 0   amLODipine (NORVASC) 10 MG tablet Take 1 tablet by mouth once daily (Patient taking differently: Take 10 mg by mouth daily.) 30 tablet 3   aspirin EC 81 MG tablet Take 1 tablet (81 mg total) by mouth daily. Swallow whole. 90 tablet 0   atorvastatin (LIPITOR) 80 MG tablet Take 1 tablet (80 mg total) by mouth daily. 30 tablet 0   Blood Pressure Monitoring (BLOOD PRESSURE MONITOR AUTOMAT) DEVI 1 Device by Does not apply route daily. 1 Device 0   clopidogrel (PLAVIX) 75 MG tablet Take 1 tablet (75 mg total) by mouth daily. 90 tablet 3   FLUoxetine (PROZAC) 20 MG capsule Take 1 capsule (20 mg total) by mouth daily. 90 capsule 2  glucose blood (CONTOUR NEXT TEST) test strip 1 each by Other route 2 (two) times daily. And lancets 2/day 200 each 3   glucose blood (ONETOUCH VERIO) test strip USE TO CHECK BLOOD SUGAR TWICE A DAY AND PRN 100 each 6   hydrALAZINE (APRESOLINE) 25 MG tablet Take 1 tablet (25 mg total) by mouth 3 (three) times daily. (Patient taking differently: Take 25 mg by mouth daily.) 90 tablet 2   lisinopril-hydrochlorothiazide (ZESTORETIC) 20-12.5 MG tablet Take 2 tablets by mouth once daily (Patient taking differently: Take 2 tablets by mouth daily.) 60 tablet 3   meclizine (ANTIVERT) 12.5 MG tablet Take 1 tablet (12.5 mg total) by mouth every 12 (twelve) hours. 30 tablet 0   metoprolol succinate (TOPROL-XL) 50 MG 24 hr tablet TAKE 1 TABLET BY MOUTH ONCE DAILY WITH  OR  IMMEDIATELY  FOLLOWING  A   MEAL 30 tablet 0   omeprazole (PRILOSEC) 20 MG capsule Take 1 capsule by mouth once daily (Patient taking differently: Take 20 mg by mouth daily.) 90 capsule 1   ONE TOUCH LANCETS MISC USE TO CHECK BLOOD SUGAR TWICE A DAY AND PRN 100 each 6   topiramate (TOPAMAX) 25 MG tablet Take 1 tablet (25 mg total) by mouth 2 (two) times daily. 60 tablet 0   triamcinolone ointment (KENALOG) 0.5 % Apply topically 2 (two) times daily as needed. (Patient taking differently: Apply 1 application topically 2 (two) times daily as needed (heat rash (summer time)).) 45 g 1   No current facility-administered medications on file prior to visit.    No Known Allergies  Family History  Problem Relation Age of Onset   Diabetes Sister    Asthma Other    Stroke Other    Hypertension Other    Diabetes Mother    Stroke Mother    Cancer Father        pt states hae had some kind of stomach cancer, ? stomach or colon   Breast cancer Neg Hx     BP (!) 150/80 (BP Location: Right Arm, Patient Position: Sitting, Cuff Size: Normal)   Pulse 80   Ht 5' 4.5" (1.638 m)   Wt 193 lb 3.2 oz (87.6 kg)   SpO2 98%   BMI 32.65 kg/m    Review of Systems     Objective:   Physical Exam Pulses: dorsalis pedis intact bilat.   MSK: no deformity of the feet CV: trace bilat leg edema Skin:  no ulcer on the feet.  normal color and temp on the feet.  Neuro: sensation is intact to touch on the feet, but decreased from normal.   Ext: there is bilateral onychomycosis of the toenails.    Lab Results  Component Value Date   HGBA1C 12.8 (H) 02/08/2021       Assessment & Plan:  Insulin-requiring type 2 DM: severe exacerbation.  I advised continuous glucose monitoring, due to risks of increasing insulin.  I told her she should carefully follow glucose, especially in view of sxs in the middle of the night.    Patient Instructions  Please increase the Lantus to 160 units each morning.  Please let us know if you have trouble  getting this from the manufacturer.   I have sent a prescription to your pharmacy, for the continuous glucose monitor sensors.  Please also see our diabetes educator check your blood sugar  a day.  vary the time of day when you check, between before the 3 meals, and at bedtime.  also check if you have symptoms of your blood sugar being too high or too low.  please keep a record of the readings and bring it to your next appointment here (or you can bring the meter itself).  You can write it on any piece of paper.  please call us sooner if your blood sugar goes below 70, or if you have a lot of readings over 200.   Please come back for a follow-up appointment in 2 months.

## 2021-02-09 NOTE — Patient Instructions (Addendum)
Please increase the Lantus to 160 units each morning.  Please let us know if you have trouble getting this from the manufacturer.   I have sent a prescription to your pharmacy, for the continuous glucose monitor sensors.  Please also see our diabetes educator check your blood sugar  a day.  vary the time of day when you check, between before the 3 meals, and at bedtime.  also check if you have symptoms of your blood sugar being too high or too low.  please keep a record of the readings and bring it to your next appointment here (or you can bring the meter itself).  You can write it on any piece of paper.  please call us sooner if your blood sugar goes below 70, or if you have a lot of readings over 200.   Please come back for a follow-up appointment in 2 months.

## 2021-02-10 ENCOUNTER — Telehealth: Payer: Self-pay

## 2021-02-10 NOTE — Telephone Encounter (Signed)
Transition Care Management Follow-up Telephone Call Date of discharge and from where: [name] How have you been since you were released from the hospital? Pt states she still has some stroke symptoms: intermittent dizziness, stability Any questions or concerns? No  Items Reviewed: Did the pt receive and understand the discharge instructions provided? Yes  Medications obtained and verified? Yes  Any new allergies since your discharge? No  Dietary orders reviewed? Yes Do you have support at home? Yes   Home Care and Equipment/Supplies: Were home health services ordered? not applicable   Functional Questionnaire: (I = Independent and D = Dependent) ADLs: I  Bathing/Dressing- I  Meal Prep- I  Eating- I  Maintaining continence- I  Transferring/Ambulation- I  Managing Meds- I  Follow up appointments reviewed:  PCP Hospital f/u appt confirmed? Yes  Scheduled to see Dr [name] on 02/15/21 @ 0830. [facility] f/u appt confirmed? No   Are transportation arrangements needed? No  If their condition worsens, is the pt aware to call PCP or go to the Emergency Dept.? Yes Was the patient provided with contact information for the PCP's office or ED? Yes Was to pt encouraged to call back with questions or concerns? No

## 2021-02-14 DIAGNOSIS — I700 Atherosclerosis of aorta: Secondary | ICD-10-CM | POA: Insufficient documentation

## 2021-02-14 NOTE — Progress Notes (Signed)
HPI: [name] is a 60 y.o. female, who is here today with her sister for hospitalization follow up.   She was hospitalized from 02/06/21 to 02/08/21. TCM call on 02/10/21.  She presented to the ER c/o bilateral upper and lower extremities numbness. Brain MRI on 02/07/21:  1. Two acute/early subacute infarcts within the left pons measuring up to 8 mm. 2. Otherwise stable noncontrast MRI appearance of the brain as compared to 04/09/2020. 3. Mild chronic small vessel ischemic changes within the cerebral white matter. 4. Small chronic lacunar infarct within the left cerebellar hemisphere.  Dx'ed with acute CVA.  Carotid US 02/08/21:  Right Carotid: Velocities in the right ICA are consistent with a 1-39% stenosis.  Left Carotid: Velocities in the left ICA are consistent with a 1-39% stenosis.  Vertebrals: Bilateral vertebral arteries demonstrate antegrade flow.   She is on Plavix 75 mg daily, the plan is to complete 3 months of dual antiplatelet therapy. She is on Aspirin 81 mg daily.  2D echo on 02/08/21 with a preserved EF 55 to 60%, mild LVH. Evidence of grade 3 moderate protruding plaque involving the descending aorta. Atorvastatin dose was increased from 40 mg to 80 mg daily.  Peripheral neuropathy: She was started on Topamax 25 mg bid was added..  Since hospital discharged she has seen her endocrinologist.  Hypertension:  Medications:Metoprolol succinate 50 mg daily,Hydralazine 25 mg tid,Lisinopril-HCTZ 20-12.5 mg 2 tabs daily,and Amlodipine 10 mg daily. BP readings at home:She is not checking BP's. Side effects:None  Negative for unusual or severe headache, visual changes, exertional chest pain, dyspnea, or edema.  Lab Results  Component Value Date   CREATININE 1.11 (H) 02/07/2021   BUN 23 02/07/2021   NA 135 02/07/2021   K 3.8 02/07/2021   CL 103 02/07/2021   CO2 24 02/07/2021   Since her last visit ,she saw ENT before hospitalization. Brain MRI  02/03/21. She needs a referral to neurologist.  Headache parietal and frontal and sometimes right temporal. This has been going on since before hospitalization.  Lips numbness and right-side of face feels "funny", intermittent for months. "Whole body" feels numb, this problem aggravates anxiety.She has not identified exacerbating or alleviating factors. No associated rash or new neurologic deficit. These symptoms have been going on for months, before hospitalization. EMG NCV in 06/14/2018 done because upper upper extremity paresthesias and weakness.  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  Overall, these findings are moderate in degree electrically.   Eye itching all the time. Blurry vision, for a few months.She has seen eye care provider. Dx'ed with allergic conjunctivitis, per pt report. Nasal congestion and pruritus.  Review of Systems  Constitutional:  Positive for activity change and fatigue. Negative for chills and fever.  Respiratory:  Negative for cough and wheezing.   Gastrointestinal:  Negative for abdominal pain, nausea and vomiting.  Endocrine: Negative for cold intolerance, heat intolerance, polydipsia, polyphagia and polyuria.  Genitourinary:  Negative for decreased urine volume, dysuria and hematuria.  Musculoskeletal:  Positive for gait problem.  Neurological:  Negative for syncope and facial asymmetry.  Hematological:  Negative for adenopathy. Does not bruise/bleed easily.  Psychiatric/Behavioral:  Negative for confusion. The patient is nervous/anxious.   Rest of ROS, see pertinent positives sand negatives in HPI  Current Outpatient Medications on File Prior to Visit  Medication Sig Dispense Refill   acetaminophen (TYLENOL) 500 MG tablet Take 2 tablets (1,000 mg total) by mouth every 6 (  six) hours as needed. 30 tablet 0   amLODipine (NORVASC) 10 MG tablet Take 1 tablet by mouth once daily  (Patient taking differently: Take 10 mg by mouth daily.) 30 tablet 3   aspirin EC 81 MG tablet Take 1 tablet (81 mg total) by mouth daily. Swallow whole. 90 tablet 0   atorvastatin (LIPITOR) 80 MG tablet Take 1 tablet (80 mg total) by mouth daily. 30 tablet 0   Blood Pressure Monitoring (BLOOD PRESSURE MONITOR AUTOMAT) DEVI 1 Device by Does not apply route daily. 1 Device 0   clopidogrel (PLAVIX) 75 MG tablet Take 1 tablet (75 mg total) by mouth daily. 90 tablet 3   Continuous Blood Gluc Sensor (FREESTYLE LIBRE 2 SENSOR) MISC 1 Device by Does not apply route every 14 (fourteen) days. 6 each 3   FLUoxetine (PROZAC) 20 MG capsule Take 1 capsule (20 mg total) by mouth daily. 90 capsule 2   glucose blood (CONTOUR NEXT TEST) test strip 1 each by Other route 2 (two) times daily. And lancets 2/day 200 each 3   glucose blood (ONETOUCH VERIO) test strip USE TO CHECK BLOOD SUGAR TWICE A DAY AND PRN 100 each 6   insulin glargine (LANTUS) 100 UNIT/ML Solostar Pen Inject 160 Units into the skin every morning. And pen needles 1/day 165 mL 3   lisinopril-hydrochlorothiazide (ZESTORETIC) 20-12.5 MG tablet Take 2 tablets by mouth once daily (Patient taking differently: Take 2 tablets by mouth daily.) 60 tablet 3   meclizine (ANTIVERT) 12.5 MG tablet Take 1 tablet (12.5 mg total) by mouth every 12 (twelve) hours. 30 tablet 0   metoprolol succinate (TOPROL-XL) 50 MG 24 hr tablet TAKE 1 TABLET BY MOUTH ONCE DAILY WITH  OR  IMMEDIATELY  FOLLOWING  A  MEAL 30 tablet 0   omeprazole (PRILOSEC) 20 MG capsule Take 1 capsule by mouth once daily (Patient taking differently: Take 20 mg by mouth daily.) 90 capsule 1   ONE TOUCH LANCETS MISC USE TO CHECK BLOOD SUGAR TWICE A DAY AND PRN 100 each 6   topiramate (TOPAMAX) 25 MG tablet Take 1 tablet (25 mg total) by mouth 2 (two) times daily. 60 tablet 0   triamcinolone ointment (KENALOG) 0.5 % Apply topically 2 (two) times daily as needed. (Patient taking differently: Apply 1  application topically 2 (two) times daily as needed (heat rash (summer time)).) 45 g 1   No current facility-administered medications on file prior to visit.   Past Medical History:  Diagnosis Date   Abnormality of gait 05/10/2010   BACK PAIN 11/14/2008   Class 1 obesity due to excess calories with body mass index (BMI) of 31.0 to 31.9 in adult 02/07/2021   DIABETES MELLITUS, TYPE II 07/15/2008   Diplopia 07/15/2008   ECZEMA, ATOPIC 04/03/2009   Guillain-Barre ([place]) 1988   HYPERLIPIDEMIA 03/06/2009   HYPERTENSION 07/15/2008   Stroke ([place]) 2010, 2011   x2    Vertebral artery stenosis    No Known Allergies  Social History   Socioeconomic History   Marital status: Single    Spouse name: Not on file   Number of children: Not on file   Years of education: Not on file   Highest education level: Not on file  Occupational History   Occupation: custodian at [employer]	[place]  Tobacco Use   Smoking status: Never   Smokeless tobacco: Never  Vaping Use   Vaping Use: Never used  Substance and Sexual Activity   Alcohol use: No   Drug  use: No   Sexual activity: Not on file  Other Topics Concern   Not on file  Social History Narrative   Not on file   Social Determinants of Health   Financial Resource Strain: Not on file  Food Insecurity: Not on file  Transportation Needs: Not on file  Physical Activity: Not on file  Stress: Not on file  Social Connections: Not on file   Vitals:   02/15/21 0828 02/15/21 0834  BP: (!) 170/90 (!) 180/80  Pulse: 87   Resp: 16   Temp: 98.2 F (36.8 C)   SpO2: 97%    Body mass index is 32.62 kg/m.  Physical Exam Vitals and nursing note reviewed.  Constitutional:      General: She is not in acute distress.    Appearance: She is well-developed.  HENT:     Head: Normocephalic and atraumatic.     Mouth/Throat:     Mouth: Mucous membranes are moist.     Pharynx: Oropharynx is clear.  Eyes:     Conjunctiva/sclera: Conjunctivae  normal.  Cardiovascular:     Rate and Rhythm: Normal rate and regular rhythm.     Heart sounds: No murmur heard.    Comments: DP pulses present. Pulmonary:     Effort: Pulmonary effort is normal. No respiratory distress.     Breath sounds: Normal breath sounds.  Abdominal:     Palpations: Abdomen is soft. There is no hepatomegaly or mass.     Tenderness: There is no abdominal tenderness.  Lymphadenopathy:     Cervical: No cervical adenopathy.  Skin:    General: Skin is warm.     Findings: No erythema or rash.  Neurological:     General: No focal deficit present.     Mental Status: She is alert and oriented to person, place, and time.     Cranial Nerves: No cranial nerve deficit.     Deep Tendon Reflexes:     Reflex Scores:      Bicep reflexes are 2+ on the right side and 2+ on the left side.      Patellar reflexes are 2+ on the right side and 2+ on the left side.    Comments: Mildly unstable gait, not assisted.  Psychiatric:        Speech: Speech is slurred (Mild).     Comments: Well groomed, good eye contact.   ASSESSMENT AND PLAN:  [name] was seen today for hospitalization follow-up.  Orders Placed This Encounter  Procedures   Ambulatory referral to Neurology   Non-seasonal allergic rhinitis, unspecified trigger Nasal saline irrigation as needed. Flonase nasal spray daily as needed, some side effect discussed.  -     fluticasone (FLONASE) 50 MCG/ACT nasal spray; Place 1 spray into both nostrils 2 (two) times daily.  Essential hypertension BP checked x 2. We discussed complications of elevated BP. She is taking all her meds as instructed. Increase dose of Hydralazine from 25 mg tid to 50 mg tid. No changes in dose of Metoprolol,Lisinopril-HCTZ,or Amlodipine. Monitor BP's at home. Low salt diet recommended.  Hyperlipidemia associated with type 2 diabetes mellitus (HCC) LDL goal < 70. Continue Atorvastatin 80 mg daily. We will plan on checking FLP in  2 months.  Cerebral thrombosis with cerebral infarction Complete 3 months of Plavix 75 mg daily. Continue Aspirin 81 mg daily and Atorvastatin 80 mg daily. We discussed the importance of better BP and glucose controlled.  Atherosclerosis of aorta (HCC) Continue Atorvastatin 80  mg and Aspirin 81 mg daily.  Anxiety disorder, unspecified type For now no changes in fluoxetine dose. We discussed some side effects and the risk of interaction with Topamax.  Diabetic peripheral neuropathy associated with type 2 diabetes mellitus (HCC) ContinueTopiramate 25 mg daily. Some side effect discussed. Continue following with neurologist. I spent a total of 57 minutes in both face to face and non face to face activities for this visit on the date of this encounter. During this time history was obtained and documented, examination was performed, prior labs/imaging reviewed, and assessment/plan discussed.  Return in about 2 weeks (around 03/01/2021).   Dovie Kapusta G. [name], MD  [facility]. [place] office.

## 2021-02-15 ENCOUNTER — Other Ambulatory Visit: Payer: Self-pay

## 2021-02-15 ENCOUNTER — Telehealth: Payer: Self-pay | Admitting: Family Medicine

## 2021-02-15 ENCOUNTER — Ambulatory Visit (INDEPENDENT_AMBULATORY_CARE_PROVIDER_SITE_OTHER): Payer: BC Managed Care – PPO | Admitting: Family Medicine

## 2021-02-15 ENCOUNTER — Encounter: Payer: Self-pay | Admitting: Family Medicine

## 2021-02-15 VITALS — BP 180/80 | HR 87 | Temp 98.2°F | Resp 16 | Ht 64.5 in | Wt 193.0 lb

## 2021-02-15 DIAGNOSIS — Z8673 Personal history of transient ischemic attack (TIA), and cerebral infarction without residual deficits: Secondary | ICD-10-CM

## 2021-02-15 DIAGNOSIS — I700 Atherosclerosis of aorta: Secondary | ICD-10-CM | POA: Diagnosis not present

## 2021-02-15 DIAGNOSIS — I10 Essential (primary) hypertension: Secondary | ICD-10-CM

## 2021-02-15 DIAGNOSIS — F419 Anxiety disorder, unspecified: Secondary | ICD-10-CM

## 2021-02-15 DIAGNOSIS — E1169 Type 2 diabetes mellitus with other specified complication: Secondary | ICD-10-CM

## 2021-02-15 DIAGNOSIS — I6330 Cerebral infarction due to thrombosis of unspecified cerebral artery: Secondary | ICD-10-CM

## 2021-02-15 DIAGNOSIS — E785 Hyperlipidemia, unspecified: Secondary | ICD-10-CM

## 2021-02-15 DIAGNOSIS — E1142 Type 2 diabetes mellitus with diabetic polyneuropathy: Secondary | ICD-10-CM

## 2021-02-15 DIAGNOSIS — J3089 Other allergic rhinitis: Secondary | ICD-10-CM

## 2021-02-15 MED ORDER — FLUTICASONE PROPIONATE 50 MCG/ACT NA SUSP: 1.0000 | Freq: Two times a day (BID) | NASAL | 3 refills | Status: DC

## 2021-02-15 MED ORDER — HYDRALAZINE HCL 25 MG PO TABS: 50.0000 mg | ORAL_TABLET | Freq: Three times a day (TID) | ORAL | 2 refills | Status: DC

## 2021-02-15 NOTE — Patient Instructions (Signed)
A few things to remember from today's visit:  Cerebral thrombosis with cerebral infarction - Plan: Ambulatory referral to Neurology  Atherosclerosis of aorta [facility])  Essential hypertension  If you need refills please call your pharmacy. Do not use My Chart to request refills or for acute issues that need immediate attention.   Nasal saline irrigations as needed. Flonase nasal spray daily as needed.  Hydralazine increased from 25 mg 3 times per day to 50 mg 3 times per day. Monitor blood pressure at home.  Please be sure medication list is accurate. If a new problem present, please set up appointment sooner than planned today.  Please call cardiologist office to arrange appt. [place]  Address: [address], [place], [place] [address] Phone: [phone]

## 2021-02-15 NOTE — Telephone Encounter (Signed)
Patient stated that her insurance company is requesting paperwork from [place] stating that patient had a MRI and stating that she had a stroke.   Insurance Fax number is [phone].  Please advise.

## 2021-02-15 NOTE — Assessment & Plan Note (Signed)
BP checked x 2. We discussed complications of elevated BP. She is taking all her meds as instructed. Increase dose of Hydralazine from 25 mg tid to 50 mg tid. No changes in dose of Metoprolol,Lisinopril-HCTZ,or Amlodipine. Monitor BP's at home. Low salt diet recommended.

## 2021-02-15 NOTE — Assessment & Plan Note (Signed)
Continue Atorvastatin 80 mg and Aspirin 81 mg daily.

## 2021-02-15 NOTE — Telephone Encounter (Signed)
Pt was seen today and call the cardiologist at [phone] that will need a  referred  from dr [name]

## 2021-02-15 NOTE — Assessment & Plan Note (Signed)
Complete 3 months of Plavix 75 mg daily. Continue Aspirin 81 mg daily and Atorvastatin 80 mg daily. We discussed the importance of better BP and glucose controlled.

## 2021-02-15 NOTE — Assessment & Plan Note (Signed)
LDL goal < 70. Continue Atorvastatin 80 mg daily. We will plan on checking FLP in 2 months.

## 2021-02-16 NOTE — Telephone Encounter (Signed)
Referral has been placed. 

## 2021-02-16 NOTE — Telephone Encounter (Signed)
Office note faxed to insurance at the number provided.

## 2021-02-18 ENCOUNTER — Encounter: Payer: Self-pay | Admitting: Neurology

## 2021-02-22 ENCOUNTER — Other Ambulatory Visit: Payer: Self-pay

## 2021-02-22 ENCOUNTER — Ambulatory Visit: Payer: BC Managed Care – PPO | Attending: Internal Medicine

## 2021-02-22 VITALS — BP 142/86

## 2021-02-22 DIAGNOSIS — R2689 Other abnormalities of gait and mobility: Secondary | ICD-10-CM | POA: Diagnosis present

## 2021-02-22 DIAGNOSIS — R2681 Unsteadiness on feet: Secondary | ICD-10-CM | POA: Insufficient documentation

## 2021-02-22 DIAGNOSIS — I639 Cerebral infarction, unspecified: Secondary | ICD-10-CM | POA: Diagnosis not present

## 2021-02-22 DIAGNOSIS — M6281 Muscle weakness (generalized): Secondary | ICD-10-CM | POA: Insufficient documentation

## 2021-02-22 DIAGNOSIS — R262 Difficulty in walking, not elsewhere classified: Secondary | ICD-10-CM | POA: Diagnosis not present

## 2021-02-22 DIAGNOSIS — R42 Dizziness and giddiness: Secondary | ICD-10-CM | POA: Insufficient documentation

## 2021-02-22 NOTE — Therapy (Signed)
[facility] [address] [place], [place], [address] Phone: [phone]   Fax:  [phone]  Physical Therapy Evaluation  Patient Details  Name: [name] MRN: [id number] Date of Birth: 18-Apr-1961 Referring Provider (PT): Referred by Phillips Climes, MD. PCP is Betty [name], MD   Encounter Date: 02/22/2021   PT End of Session - 02/22/21 0844     Visit Number 1    Number of Visits 17    Date for PT Re-Evaluation 04/23/21    Authorization Type BCBS    PT Start Time 0845    PT Stop Time 0928    PT Time Calculation (min) 43 min    Equipment Utilized During Treatment Gait belt    Activity Tolerance Patient tolerated treatment well    Behavior During Therapy [facility] for tasks assessed/performed             Past Medical History:  Diagnosis Date   Abnormality of gait 05/10/2010   BACK PAIN 11/14/2008   Class 1 obesity due to excess calories with body mass index (BMI) of 31.0 to 31.9 in adult 02/07/2021   DIABETES MELLITUS, TYPE II 07/15/2008   Diplopia 07/15/2008   ECZEMA, ATOPIC 04/03/2009   Guillain-Barre ([place]) 1988   HYPERLIPIDEMIA 03/06/2009   HYPERTENSION 07/15/2008   Stroke ([place]) 2010, 2011   x2    Vertebral artery stenosis     Past Surgical History:  Procedure Laterality Date   ABDOMINAL HYSTERECTOMY     DILATION AND CURETTAGE OF UTERUS     FOOT SURGERY      Vitals:   02/22/21 0902  BP: (!) 142/86      Subjective Assessment - 02/22/21 0848     Subjective Patient was discharged home on 02/08/21. Patient reports things have been going well but going slow. Has followed up with PCP (Dr. [name]) but waiting to follow up with Neurology. Patient reports that she is having some weakness in the legs, also reports numbness in the legs. Does have hx of neuropathy. Patient also reports intermittent dizziness and blurred vision. No falls. Patient reports she does use a SPC at times, but did not bring  that today. Is not utilizing it in the house, just when out in the community. 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    Patient is accompained by: Family member   family member in the lobby   Pertinent History CVA, Vertebral Artery Stenosis, HLD, HTN, Guillain - Barre, DM Type 2, Obesity, lymphoma    Limitations Standing;Walking;House hold activities    How long can you walk comfortably? 30 minutes    Diagnostic tests Pt found to have acute/subacute infarcts to L pons and occlusion to L vertebral artery    Patient Stated Goals Improve the strength    Currently in Pain? Yes    Pain Score --   unable to givve it a number   Pain Location Leg    Pain Orientation Right;Left    Pain Descriptors / Indicators Discomfort    Pain Type Acute pain    Pain Onset 1 to 4 weeks ago    Pain Frequency Intermittent                [facility] PT Assessment - 02/22/21 0001       Assessment   Medical Diagnosis CVA    Referring Provider (PT) Referred by Phillips Climes, MD. PCP is Betty [name], MD    Onset Date/Surgical Date  02/06/21   date of CVA   Hand Dominance Right    Prior Therapy Prior PT after prior CVA      Precautions   Precautions Other (comment);Fall    Precaution Comments CVA, Vertebral Artery Stenosis, HLD, HTN, Guillain - Barre, DM Type 2, Obesity, lymphoma      Restrictions   Weight Bearing Restrictions No      Balance Screen   Has the patient fallen in the past 6 months No    Has the patient had a decrease in activity level because of a fear of falling?  Yes    Is the patient reluctant to leave their home because of a fear of falling?  Yes      [place] Private residence    Living Arrangements Alone    Available Help at Discharge Family;Neighbor    Type of [place] to enter    Entrance Stairs-Number of Steps 2    [place] - single point    Additional  Comments this is for her apartment, she has been living with her sister (lives on 2nd floor, require ascend/descend one flight of stairs)      Prior Function   Level of Independence Independent    Vocation Full time employment    Vocation Requirements Custodian      Cognition   Overall Cognitive Status Within Functional Limits for tasks assessed      Observation/Other Assessments   Focus on Therapeutic Outcomes (FOTO)  LE Stroke; Not captured by staff on Eval      Sensation   Light Touch Impaired by gross assessment    Additional Comments History of Neuropathy in BLE; slight diminshed sensation but able to detect      Coordination   Gross Motor Movements are Fluid and Coordinated No    Coordination and Movement Description generally uncoordinated in BLE      ROM / Strength   AROM / PROM / Strength AROM;Strength      AROM   Overall AROM  Within functional limits for tasks performed    Overall AROM Comments for BLE      Strength   Overall Strength Deficits    Strength Assessment Site Hip;Knee;Ankle    Right/Left Hip Right;Left    Right Hip Flexion 4/5    Left Hip Flexion 4/5    Right/Left Knee Right;Left    Right Knee Flexion 4/5    Right Knee Extension 4-/5    Left Knee Flexion 4/5    Left Knee Extension 4/5    Right/Left Ankle Right;Left    Right Ankle Dorsiflexion 4-/5    Left Ankle Dorsiflexion 4-/5      Transfers   Transfers Sit to Stand;Stand to Sit    Sit to Stand 5: Supervision;4: Min guard    Five time sit to stand comments  17.28 secs with BUE support from standard chair.    Stand to Sit 5: Supervision      Ambulation/Gait   Ambulation/Gait Yes    Ambulation/Gait Assistance 4: Min guard;5: Supervision    Ambulation/Gait Assistance Details mild unsteadiness noted with ambulation, increased cadence noted with shortened step length requiring cues for step length and control with ambulation. no AD used.    Ambulation Distance (Feet) 115 Feet    Assistive  device None   has a SPC utilizes at home; did not bring to session. PT  educated on importance of use for safety   Gait Pattern Step-through pattern;Decreased step length - right;Decreased step length - left;Narrow base of support;Poor foot clearance - left;Poor foot clearance - right    Ambulation Surface Level;Indoor    Gait velocity 14.81 seconds without AD = 2.21 ft/sec      Standardized Balance Assessment   Standardized Balance Assessment Timed Up and Go Test;Berg Balance Test      Berg Balance Test   Sit to Stand Able to stand  independently using hands    Standing Unsupported Able to stand 2 minutes with supervision    Sitting with Back Unsupported but Feet Supported on Floor or Stool Able to sit safely and securely 2 minutes    Stand to Sit Controls descent by using hands    Transfers Able to transfer safely, minor use of hands    Standing Unsupported with Eyes Closed Able to stand 10 seconds with supervision    Standing Unsupported with Feet Together Able to place feet together independently and stand for 1 minute with supervision    From Standing, Reach Forward with Outstretched Arm Can reach forward >12 cm safely (5")   8"   From Standing Position, Pick up Object from Floor Able to pick up shoe, needs supervision    From Standing Position, Turn to Look Behind Over each Shoulder Looks behind one side only/other side shows less weight shift    Turn 360 Degrees Able to turn 360 degrees safely but slowly    Standing Unsupported, Alternately Place Feet on Step/Stool Able to stand independently and complete 8 steps >20 seconds    Standing Unsupported, One Foot in Front Able to take small step independently and hold 30 seconds    Standing on One Leg Tries to lift leg/unable to hold 3 seconds but remains standing independently    Total Score 40    Berg comment: 40/56      Timed Up and Go Test   TUG Normal TUG    Normal TUG (seconds) 9.29    TUG Comments without AD. close supervision                         Objective measurements completed on examination: See above findings.                PT Education - 02/22/21 0853     Education Details Educated on POC/evaluation findings    Person(s) Educated Patient    Methods Explanation    Comprehension Verbalized understanding              PT Short Term Goals - 02/22/21 1257       PT SHORT TERM GOAL #1   Title Patient will be independent with initial HEP for strength/balance (ALL STGs Due: 03/26/21)    Baseline no HEP established    Time 4    Period Weeks    Status New    Target Date 03/26/21      PT SHORT TERM GOAL #2   Title Patient will be able to ambulate >/= 500 ft on indoor surfaces w/ LRAD vs. no AD at Mod I level    Baseline supervision/CGA, 115 ft    Time 4    Period Weeks    Status New      PT SHORT TERM GOAL #3   Title Patient will improve gait speed to >/= 2.5 ft/sec    Baseline 2.21 ft/sec    Time  4    Period Weeks    Status New      PT SHORT TERM GOAL #4   Title Patient will improve Berg Balance to >/= 45/56 to demo reduced fall risk and improved balance.    Baseline 40/56    Time 4    Period Weeks    Status New               PT Long Term Goals - 02/22/21 1300       PT LONG TERM GOAL #1   Title Patient will be independent with final HEP for strength/balance and report completion >/= 5x/week for compliance upon d/c (ALL LTGs Due: 04/23/21)    Baseline no HEP established    Time 8    Period Weeks    Status New    Target Date 04/23/21      PT LONG TERM GOAL #2   Title Patient will improve gait speed to >/= 3.0 ft/sec to demo improved community mobility    Baseline 2.21 ft/sec    Time 8    Period Weeks    Status New      PT LONG TERM GOAL #3   Title Patient will improve 5x sit <> stand to </= 12 seconds to demo improved balance and reduced fall risk    Baseline 17.28 secs    Time 8    Period Weeks    Status New      PT LONG TERM GOAL #4    Title Patient will improve Berg Balance to >/= 50/56 to demo reduced fall risk and improved balance    Baseline 40/56    Time 8    Period Weeks    Status New      PT LONG TERM GOAL #5   Title Patient will be able to ambulate to >/= 1000 ft outdoors on unlevel surfaces with Mod I and LRAD    Baseline TBA    Time 8    Period Weeks    Status New                    Plan - 02/22/21 1304     Clinical Impression Statement Patient is a 60 y.o. female that was referred to Neuro OPPT services for CVA. PMH significant for the following: CVA, Vertebral Artery Stenosis, HLD, HTN, Guillain - Barre, DM Type 2, Obesity, lymphoma. Patient presents with the following impairments upon eval: decreased strength, decreased sensation, impaired coordination, impaired balance, abnormal gait and increased fall risk. patient is currently ambulating at 2.21 ft/sec without AD demo limited community ambulator. Patient is also at increased fall risk with Berg Balance at 40/56 and 5x sit <> stand at 17.28 seconds. patient will benefit from skilled PT services to address impairments and reduce fall risk.    Personal Factors and Comorbidities Comorbidity 3+    Comorbidities CVA, Vertebral Artery Stenosis, HLD, HTN, Guillain - Barre, DM Type 2, Obesity, lymphoma    Examination-Activity Limitations Stairs;Stand;Locomotion Level;Bend;Squat    Examination-Participation Restrictions Occupation;Cleaning;Driving;Community Activity    Stability/Clinical Decision Making Evolving/Moderate complexity    Clinical Decision Making Moderate    Rehab Potential Good    PT Frequency 2x / week    PT Duration 8 weeks    PT Treatment/Interventions ADLs/Self Care Home Management;Aquatic Therapy;Canalith Repostioning;Biofeedback;Moist Heat;Electrical Stimulation;Cryotherapy;DME Instruction;Gait training;Stair training;Functional mobility training;Therapeutic activities;Therapeutic exercise;Balance training;Neuromuscular  re-education;Patient/family education;Orthotic Fit/Training;Manual techniques;Passive range of motion;Vestibular    PT Next Visit Plan Initiate HEP focused on balance/strength. assess  gait with SPC.    Recommended Other Services None at this time    Consulted and Agree with Plan of Care Patient             Patient will benefit from skilled therapeutic intervention in order to improve the following deficits and impairments:  Abnormal gait, Decreased balance, Decreased endurance, Difficulty walking, Dizziness, Impaired sensation, Decreased range of motion, Decreased activity tolerance, Decreased coordination, Decreased knowledge of use of DME, Decreased strength  Visit Diagnosis: Difficulty in walking, not elsewhere classified  Unsteadiness on feet  Muscle weakness (generalized)  Other abnormalities of gait and mobility  Dizziness and giddiness     Problem List Patient Active Problem List   Diagnosis Date Noted   Atherosclerosis of aorta ([place]) 02/14/2021   Cerebral thrombosis with cerebral infarction 02/08/2021   Abnormal MRI of head 02/07/2021   Vertigo 02/07/2021   Class 1 obesity due to excess calories with body mass index (BMI) of 31.0 to 31.9 in adult 02/07/2021   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   Trigger finger, left ring finger 10/04/2018   Alternating constipation and diarrhea 04/20/2018   Diabetic peripheral neuropathy associated with type 2 diabetes mellitus ([place]) 02/26/2018   DM (diabetes mellitus), type 2 with peripheral vascular complications ([place]) 32/99/2426   ABNORMALITY OF GAIT 05/10/2010   ECZEMA, ATOPIC 04/03/2009   Hyperlipidemia associated with type 2 diabetes mellitus ([place]) 03/06/2009   BACK PAIN 11/14/2008   Cerebrovascular disease 08/05/2008   DIPLOPIA 07/15/2008   Essential hypertension 07/15/2008    Jones Bales, PT, DPT 02/22/2021, 1:09 PM  [facility] [address]  [place], [place], [address] Phone: [phone]   Fax:  [phone]  Name: [name] MRN: [id number] Date of Birth: [birth year]

## 2021-02-24 ENCOUNTER — Other Ambulatory Visit: Payer: Self-pay

## 2021-02-24 ENCOUNTER — Ambulatory Visit: Payer: BC Managed Care – PPO

## 2021-02-24 VITALS — BP 145/85

## 2021-02-24 DIAGNOSIS — R2689 Other abnormalities of gait and mobility: Secondary | ICD-10-CM

## 2021-02-24 DIAGNOSIS — M6281 Muscle weakness (generalized): Secondary | ICD-10-CM

## 2021-02-24 DIAGNOSIS — R262 Difficulty in walking, not elsewhere classified: Secondary | ICD-10-CM | POA: Diagnosis not present

## 2021-02-24 DIAGNOSIS — R2681 Unsteadiness on feet: Secondary | ICD-10-CM

## 2021-02-24 NOTE — Therapy (Signed)
[facility] [address] [place], [place], [address] Phone: [phone]   Fax:  [phone]  Physical Therapy Treatment  Patient Details  Name: [name] MRN: [id number] Date of Birth: November 24, 1960 Referring Provider (PT): Referred by Phillips Climes, MD. PCP is Betty [name], MD   Encounter Date: 02/24/2021   PT End of Session - 02/24/21 1023     Visit Number 2    Number of Visits 17    Date for PT Re-Evaluation 04/23/21    Authorization Type BCBS    PT Start Time 1019    PT Stop Time 1057    PT Time Calculation (min) 38 min    Equipment Utilized During Treatment Gait belt    Activity Tolerance Patient tolerated treatment well    Behavior During Therapy WFL for tasks assessed/performed             Past Medical History:  Diagnosis Date   Abnormality of gait 05/10/2010   BACK PAIN 11/14/2008   Class 1 obesity due to excess calories with body mass index (BMI) of 31.0 to 31.9 in adult 02/07/2021   DIABETES MELLITUS, TYPE II 07/15/2008   Diplopia 07/15/2008   ECZEMA, ATOPIC 04/03/2009   Guillain-Barre ([place]) 1988   HYPERLIPIDEMIA 03/06/2009   HYPERTENSION 07/15/2008   Stroke ([place]) 2010, 2011   x2    Vertebral artery stenosis     Past Surgical History:  Procedure Laterality Date   ABDOMINAL HYSTERECTOMY     DILATION AND CURETTAGE OF UTERUS     FOOT SURGERY      Vitals:   02/24/21 1023  BP: (!) 145/85     Subjective Assessment - 02/24/21 1023     Subjective Pt reports that she is doing ok. She brought her cane in with her. Reports that blood sugar as been running around 150 and BP has been doing pretty good when checks at home.    Patient is accompained by: Family member   family member in the lobby   Pertinent History CVA, Vertebral Artery Stenosis, HLD, HTN, Guillain - Barre, DM Type 2, Obesity, lymphoma    Limitations Standing;Walking;House hold activities    How long can you walk  comfortably? 30 minutes    Diagnostic tests Pt found to have acute/subacute infarcts to L pons and occlusion to L vertebral artery    Patient Stated Goals Improve the strength    Currently in Pain? Yes    Pain Score 5     Pain Location Back    Pain Orientation Lower    Pain Descriptors / Indicators Aching    Pain Type Chronic pain    Pain Onset 1 to 4 weeks ago    Pain Frequency Intermittent    Aggravating Factors  when first gets up after sitting awhile.                               [facility] Adult PT Treatment/Exercise - 02/24/21 1028       Ambulation/Gait   Ambulation/Gait Yes    Ambulation/Gait Assistance 5: Supervision    Ambulation/Gait Assistance Details Pt was cued to keep head up and try to increase step length.    Ambulation Distance (Feet) 230 Feet    Assistive device Straight cane    Gait Pattern Step-through pattern;Decreased stance time - left;Decreased dorsiflexion - left    Ambulation Surface Level;Indoor      Neuro Re-ed  Neuro Re-ed Details  In // bars: standing with feet together eyes open x 30 sec, eyes closed x 30 sec CGA with slight increased sway. Feet together with head turns up/down x 30 sec, head turns left/right x 30 sec. Repeated the above balance exercises standing on airex. Pt has slight increased sway on airex especially with eyes closed. Verbal cues to keep core tight to help. CGA for safety. Demonstrated corner balance exercise on pillow for pt to perform at home.      Exercises   Exercises Other Exercises    Other Exercises  In // bars: marching walk without UE support x 3 laps then side stepping x 3 laps with verbal cues for form. Close SBA/CGA for safety.                     PT Education - 02/24/21 1436     Education Details Initial HEP    Person(s) Educated Patient    Methods Explanation;Demonstration;Handout    Comprehension Verbalized understanding;Returned demonstration              PT Short Term  Goals - 02/22/21 1257       PT SHORT TERM GOAL #1   Title Patient will be independent with initial HEP for strength/balance (ALL STGs Due: 03/26/21)    Baseline no HEP established    Time 4    Period Weeks    Status New    Target Date 03/26/21      PT SHORT TERM GOAL #2   Title Patient will be able to ambulate >/= 500 ft on indoor surfaces w/ LRAD vs. no AD at Mod I level    Baseline supervision/CGA, 115 ft    Time 4    Period Weeks    Status New      PT SHORT TERM GOAL #3   Title Patient will improve gait speed to >/= 2.5 ft/sec    Baseline 2.21 ft/sec    Time 4    Period Weeks    Status New      PT SHORT TERM GOAL #4   Title Patient will improve Berg Balance to >/= 45/56 to demo reduced fall risk and improved balance.    Baseline 40/56    Time 4    Period Weeks    Status New               PT Long Term Goals - 02/22/21 1300       PT LONG TERM GOAL #1   Title Patient will be independent with final HEP for strength/balance and report completion >/= 5x/week for compliance upon d/c (ALL LTGs Due: 04/23/21)    Baseline no HEP established    Time 8    Period Weeks    Status New    Target Date 04/23/21      PT LONG TERM GOAL #2   Title Patient will improve gait speed to >/= 3.0 ft/sec to demo improved community mobility    Baseline 2.21 ft/sec    Time 8    Period Weeks    Status New      PT LONG TERM GOAL #3   Title Patient will improve 5x sit <> stand to </= 12 seconds to demo improved balance and reduced fall risk    Baseline 17.28 secs    Time 8    Period Weeks    Status New      PT LONG TERM GOAL #4   Title  Patient will improve Berg Balance to >/= 50/56 to demo reduced fall risk and improved balance    Baseline 40/56    Time 8    Period Weeks    Status New      PT LONG TERM GOAL #5   Title Patient will be able to ambulate to >/= 1000 ft outdoors on unlevel surfaces with Mod I and LRAD    Baseline TBA    Time 8    Period Weeks    Status New                    Plan - 02/24/21 1437     Clinical Impression Statement Pt did well with SPC in session today. Focused on starting intial HEP for strengthening and balance. Pt most challenged on compliant surface with eyes closed.    Personal Factors and Comorbidities Comorbidity 3+    Comorbidities CVA, Vertebral Artery Stenosis, HLD, HTN, Guillain - Barre, DM Type 2, Obesity, lymphoma    Examination-Activity Limitations Stairs;Stand;Locomotion Level;Bend;Squat    Examination-Participation Restrictions Occupation;Cleaning;Driving;Community Activity    Stability/Clinical Decision Making Evolving/Moderate complexity    Rehab Potential Good    PT Frequency 2x / week    PT Duration 8 weeks    PT Treatment/Interventions ADLs/Self Care Home Management;Aquatic Therapy;Canalith Repostioning;Biofeedback;Moist Heat;Electrical Stimulation;Cryotherapy;DME Instruction;Gait training;Stair training;Functional mobility training;Therapeutic activities;Therapeutic exercise;Balance training;Neuromuscular re-education;Patient/family education;Orthotic Fit/Training;Manual techniques;Passive range of motion;Vestibular    PT Next Visit Plan Continue with balance training on compliant surfaces, dynamic gait activities. Gait on varied surfaces with cane.    Consulted and Agree with Plan of Care Patient             Patient will benefit from skilled therapeutic intervention in order to improve the following deficits and impairments:  Abnormal gait, Decreased balance, Decreased endurance, Difficulty walking, Dizziness, Impaired sensation, Decreased range of motion, Decreased activity tolerance, Decreased coordination, Decreased knowledge of use of DME, Decreased strength  Visit Diagnosis: Other abnormalities of gait and mobility  Muscle weakness (generalized)  Unsteadiness on feet     Problem List Patient Active Problem List   Diagnosis Date Noted   Atherosclerosis of aorta ([place]) 02/14/2021    Cerebral thrombosis with cerebral infarction 02/08/2021   Abnormal MRI of head 02/07/2021   Vertigo 02/07/2021   Class 1 obesity due to excess calories with body mass index (BMI) of 31.0 to 31.9 in adult 02/07/2021   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   Trigger finger, left ring finger 10/04/2018   Alternating constipation and diarrhea 04/20/2018   Diabetic peripheral neuropathy associated with type 2 diabetes mellitus ([place]) 02/26/2018   DM (diabetes mellitus), type 2 with peripheral vascular complications ([place]) 78/67/6720   ABNORMALITY OF GAIT 05/10/2010   ECZEMA, ATOPIC 04/03/2009   Hyperlipidemia associated with type 2 diabetes mellitus ([place]) 03/06/2009   BACK PAIN 11/14/2008   Cerebrovascular disease 08/05/2008   DIPLOPIA 07/15/2008   Essential hypertension 07/15/2008    Electa Sniff, PT, DPT, NCS 02/24/2021, 2:39 PM  [facility] [address] [place], [place], [address] Phone: [phone]   Fax:  [phone]  Name: [name] MRN: [id number] Date of Birth: 01-01-1961

## 2021-02-24 NOTE — Patient Instructions (Signed)
Access Code: [email or web address] URL: [email or web address] Date: 02/24/2021 Prepared by: Cherly Anderson  Exercises Standing on foam eyes open - 2 x daily - 5 x weekly - 1 sets - 2 reps - 30 sec hold Romberg Stance Eyes Closed on Foam Pad - 2 x daily - 5 x weekly - 1 sets - 2 reps - 30 sec hold Walking March - 1 x daily - 5 x weekly - 2 sets - 3-5 reps Side Stepping with Counter Support - 1 x daily - 5 x weekly - 2 sets - 3-5 reps

## 2021-02-26 NOTE — Progress Notes (Signed)
HPI: [name] is a 60 y.o. female, who is here today with her nephew to follow on chronic medical conditions.  She was seen on 02/15/21 for a hospital follow up. Since her last visit she has started PT.  No new problems since her last visit.  Hypertension:  Medications:Lisinopril-HCTZ 20-12.5 mg 2 tabs daily, Hydralazine 50 mg tid,Metoprolol succinate 50 mg daily, and Amlodipine 10 mg daily. BP readings at home:130-140's/80's. Side effects:None  Negative for unusual or severe headache, visual changes, exertional chest pain, dyspnea,new focal weakness, or edema. Cardiologist's office tried to reach her x 2, she still does not have appt. She was initially for palpitations, which have improved.  Lab Results  Component Value Date   CREATININE 1.11 (H) 02/07/2021   BUN 23 02/07/2021   NA 135 02/07/2021   K 3.8 02/07/2021   CL 103 02/07/2021   CO2 24 02/07/2021   Anxiety: She is on Fluoxetine 20 mg daily. Shaking and nervous episodes she was reporting last visit have improved.She feels like she is dealing with stress better.  She is tolerating medication well.  HLD: She is on atorvastatin 80 mg daily. CVA: She has appt with neuro on 03/24/21.. She is on Plavix 75 mg daily and Aspirin 81 mg daily. She needs letter for work to extend short term disability, we have not received FMLA forms. She is not driving.  She is using a cane for transfer.  Lab Results  Component Value Date   CHOL 223 (H) 02/08/2021   HDL 35 (L) 02/08/2021   LDLCALC 169 (H) 02/08/2021   LDLDIRECT 175.9 03/02/2012   TRIG 93 02/08/2021   CHOLHDL 6.4 02/08/2021   Lab Results  Component Value Date   ALT 18 09/04/2020   AST 16 09/04/2020   ALKPHOS 99 09/04/2020   BILITOT 0.4 09/04/2020   She needs a refill on meclizine, which she states has helped some with episodes of dizziness. Spinning like sensation and sometimes leaning to the sides when walking. Problem has been intermittent for a  while. Exacerbated by movement and alleviated by rest. Cerebellar CVA 08/2010. Evaluated by ENT in 12/2020. No new associated symptoms.  Today she is concerned about lesion on right calf, she noted it recently and not sure when it started. Lesion is pruritic, mildly tender. No prior hx. She has not tried OTC medications.  Review of Systems  Constitutional:  Positive for fatigue.  Respiratory:  Negative for cough and wheezing.   Genitourinary:  Negative for decreased urine volume, dysuria and hematuria.  Neurological:  Negative for syncope and facial asymmetry.  Psychiatric/Behavioral:  Negative for confusion. The patient is nervous/anxious.   Rest see pertinent positives and negatives per HPI.  Current Outpatient Medications on File Prior to Visit  Medication Sig Dispense Refill   acetaminophen (TYLENOL) 500 MG tablet Take 2 tablets (1,000 mg total) by mouth every 6 (six) hours as needed. 30 tablet 0   amLODipine (NORVASC) 10 MG tablet Take 1 tablet by mouth once daily (Patient taking differently: Take 10 mg by mouth daily.) 30 tablet 3   aspirin EC 81 MG tablet Take 1 tablet (81 mg total) by mouth daily. Swallow whole. 90 tablet 0   atorvastatin (LIPITOR) 80 MG tablet Take 1 tablet (80 mg total) by mouth daily. 30 tablet 0   Blood Pressure Monitoring (BLOOD PRESSURE MONITOR AUTOMAT) DEVI 1 Device by Does not apply route daily. 1 Device 0   clopidogrel (PLAVIX) 75 MG tablet Take 1 tablet (75 mg  total) by mouth daily. 90 tablet 3   Continuous Blood Gluc Sensor (FREESTYLE LIBRE 2 SENSOR) MISC 1 Device by Does not apply route every 14 (fourteen) days. 6 each 3   FLUoxetine (PROZAC) 20 MG capsule Take 1 capsule (20 mg total) by mouth daily. 90 capsule 2   fluticasone (FLONASE) 50 MCG/ACT nasal spray Place 1 spray into both nostrils 2 (two) times daily. 16 g 3   glucose blood (CONTOUR NEXT TEST) test strip 1 each by Other route 2 (two) times daily. And lancets 2/day 200 each 3   glucose blood  (ONETOUCH VERIO) test strip USE TO CHECK BLOOD SUGAR TWICE A DAY AND PRN 100 each 6   insulin glargine (LANTUS) 100 UNIT/ML Solostar Pen Inject 160 Units into the skin every morning. And pen needles 1/day 165 mL 3   lisinopril-hydrochlorothiazide (ZESTORETIC) 20-12.5 MG tablet Take 2 tablets by mouth once daily (Patient taking differently: Take 2 tablets by mouth daily.) 60 tablet 3   metoprolol succinate (TOPROL-XL) 50 MG 24 hr tablet TAKE 1 TABLET BY MOUTH ONCE DAILY WITH  OR  IMMEDIATELY  FOLLOWING  A  MEAL 30 tablet 0   omeprazole (PRILOSEC) 20 MG capsule Take 1 capsule by mouth once daily (Patient taking differently: Take 20 mg by mouth daily.) 90 capsule 1   ONE TOUCH LANCETS MISC USE TO CHECK BLOOD SUGAR TWICE A DAY AND PRN 100 each 6   topiramate (TOPAMAX) 25 MG tablet Take 1 tablet (25 mg total) by mouth 2 (two) times daily. 60 tablet 0   No current facility-administered medications on file prior to visit.    Past Medical History:  Diagnosis Date   Abnormality of gait 05/10/2010   BACK PAIN 11/14/2008   Class 1 obesity due to excess calories with body mass index (BMI) of 31.0 to 31.9 in adult 02/07/2021   DIABETES MELLITUS, TYPE II 07/15/2008   Diplopia 07/15/2008   ECZEMA, ATOPIC 04/03/2009   Guillain-Barre ([place]) 1988   HYPERLIPIDEMIA 03/06/2009   HYPERTENSION 07/15/2008   Stroke ([place]) 2010, 2011   x2    Vertebral artery stenosis    No Known Allergies  Social History   Socioeconomic History   Marital status: Single    Spouse name: Not on file   Number of children: Not on file   Years of education: Not on file   Highest education level: Not on file  Occupational History   Occupation: custodian at [employer]	[place]  Tobacco Use   Smoking status: Never   Smokeless tobacco: Never  Vaping Use   Vaping Use: Never used  Substance and Sexual Activity   Alcohol use: No   Drug use: No   Sexual activity: Not on file  Other Topics Concern   Not on file  Social History  Narrative   Not on file   Social Determinants of Health   Financial Resource Strain: Not on file  Food Insecurity: Not on file  Transportation Needs: Not on file  Physical Activity: Not on file  Stress: Not on file  Social Connections: Not on file   Vitals:   03/01/21 0933  BP: 140/80  Pulse: 82  Resp: 16  SpO2: 98%   Body mass index is 32.62 kg/m.  Physical Exam Vitals and nursing note reviewed.  Constitutional:      General: She is not in acute distress.    Appearance: She is well-developed.  HENT:     Head: Normocephalic and atraumatic.     Mouth/Throat:  Mouth: Mucous membranes are moist.     Pharynx: Oropharynx is clear.  Eyes:     Conjunctiva/sclera: Conjunctivae normal.  Cardiovascular:     Rate and Rhythm: Normal rate and regular rhythm.     Pulses:          Dorsalis pedis pulses are 2+ on the right side and 2+ on the left side.     Heart sounds: No murmur heard.    Comments: Trace pitting LE edema, bilateral. Pulmonary:     Effort: Pulmonary effort is normal. No respiratory distress.     Breath sounds: Normal breath sounds.  Abdominal:     Palpations: Abdomen is soft. There is no hepatomegaly or mass.     Tenderness: There is no abdominal tenderness.  Musculoskeletal:       Legs:  Lymphadenopathy:     Cervical: No cervical adenopathy.  Skin:    General: Skin is warm.     Findings: Lesion present. No erythema or rash.     Comments: See musculoskeletal/LE.  Neurological:     General: No focal deficit present.     Mental Status: She is alert and oriented to person, place, and time.     Cranial Nerves: No cranial nerve deficit.     Gait: Gait normal.     Comments: Slow, mildly unstable gait assisted with a cane.  Psychiatric:        Speech: Speech is slurred.     Comments: Well groomed, good eye contact.   ASSESSMENT AND PLAN:  [name] was seen today for follow-up.  Diagnoses and all orders for this visit: Orders Placed This Encounter   Procedures   Flu Vaccine QUAD 35mo+IM (Fluarix, Fluzone & Alfiuria Quad PF)   Ambulatory referral to Dermatology   Skin lesion of right leg We discussed possible etiologies, including their possibility of SCC. Recommend small amount of topical triamcinolone twice daily for up to 14 days and then as needed. Dermatology referral placed, she may need skin biopsy.  -     triamcinolone ointment (KENALOG) 0.5 %; Apply topically 2 (two) times daily as needed.  Need for influenza vaccination -     Flu Vaccine QUAD 48mo+IM (Fluarix, Fluzone & Alfiuria Quad PF)  Vertigo We reviewed differential diagnosis, including positional vertigo, sequela of cerebellar CVA. Problem is chronic, so most likely she will continue having intermittent episodes.  She needs to continue avoiding activities that increase the risk for injury. Meclizine seems to help, so continue 25 mg bid prn, some side effects discussed. Fall precautions and PT to continue.  Hyperlipidemia associated with type 2 diabetes mellitus (HCC) LDL goal < 70. Will plan on checking FLP next visit. Continue Atorvastatin 80 mg daily. Low fat diet discussed.  Essential hypertension Better controlled. For now continue same dose of Metoprolol succinate,Amlodipine,Lisinopril-HCTZ,and Hydralazine. Continue monitoring BP regularly. Low salt/DASH diet. She has scheduled eye exam.  Anxiety disorder, unspecified type Problem has improved since her last visit, so for now no changes in fluoxetine dose.  Cerebral thrombosis with cerebral infarction Complete 3 months of Plavix 75 mg and then continue with Aspirin 81 mg daily. Keep appointment with neurologist.  FMLA will be completed when we receive forms, tentative date to return to work on 04/30/21. She will need some restrictions/accommodations due to vertigo.  I spent a total of 43 minutes in both face to face and non face to face activities for this visit on the date of this encounter.  During this time history was obtained  and documented, examination was performed, prior labs reviewed, and assessment/plan discussed. Instructed to contact cardiologist's office to arrange appt. She also needs to arrange f/u appt with Dr Loanne Drilling to follow on DM II.  Return in about 8 weeks (around 04/26/2021) for F/u and fasting labs.  Madaline Lefeber G. [name], MD  [facility]. [place] office.

## 2021-03-01 ENCOUNTER — Telehealth: Payer: Self-pay

## 2021-03-01 ENCOUNTER — Telehealth: Payer: Self-pay | Admitting: Family Medicine

## 2021-03-01 ENCOUNTER — Other Ambulatory Visit: Payer: Self-pay

## 2021-03-01 ENCOUNTER — Encounter: Payer: Self-pay | Admitting: Family Medicine

## 2021-03-01 ENCOUNTER — Ambulatory Visit (INDEPENDENT_AMBULATORY_CARE_PROVIDER_SITE_OTHER): Payer: BC Managed Care – PPO | Admitting: Family Medicine

## 2021-03-01 VITALS — BP 140/80 | HR 82 | Resp 16 | Ht 64.5 in | Wt 193.0 lb

## 2021-03-01 DIAGNOSIS — I10 Essential (primary) hypertension: Secondary | ICD-10-CM | POA: Diagnosis not present

## 2021-03-01 DIAGNOSIS — F419 Anxiety disorder, unspecified: Secondary | ICD-10-CM

## 2021-03-01 DIAGNOSIS — L989 Disorder of the skin and subcutaneous tissue, unspecified: Secondary | ICD-10-CM

## 2021-03-01 DIAGNOSIS — R42 Dizziness and giddiness: Secondary | ICD-10-CM

## 2021-03-01 DIAGNOSIS — I6330 Cerebral infarction due to thrombosis of unspecified cerebral artery: Secondary | ICD-10-CM

## 2021-03-01 DIAGNOSIS — Z23 Encounter for immunization: Secondary | ICD-10-CM

## 2021-03-01 DIAGNOSIS — E1169 Type 2 diabetes mellitus with other specified complication: Secondary | ICD-10-CM | POA: Diagnosis not present

## 2021-03-01 DIAGNOSIS — E785 Hyperlipidemia, unspecified: Secondary | ICD-10-CM

## 2021-03-01 MED ORDER — MECLIZINE HCL 12.5 MG PO TABS: 12.5000 mg | ORAL_TABLET | Freq: Two times a day (BID) | ORAL | 1 refills | Status: DC | PRN

## 2021-03-01 MED ORDER — TRIAMCINOLONE ACETONIDE 0.5 % EX OINT: TOPICAL_OINTMENT | Freq: Two times a day (BID) | CUTANEOUS | 1 refills | Status: DC | PRN

## 2021-03-01 MED ORDER — HYDRALAZINE HCL 50 MG PO TABS: 50.0000 mg | ORAL_TABLET | Freq: Three times a day (TID) | ORAL | 1 refills | Status: DC

## 2021-03-01 NOTE — Telephone Encounter (Signed)
Patient called stating that [facility] called to schedule appt 11/4

## 2021-03-01 NOTE — Telephone Encounter (Signed)
fyi

## 2021-03-01 NOTE — Assessment & Plan Note (Addendum)
Better controlled. For now continue same dose of Metoprolol succinate,Amlodipine,Lisinopril-HCTZ,and Hydralazine. Continue monitoring BP regularly. Low salt/DASH diet. She has scheduled eye exam.

## 2021-03-01 NOTE — Telephone Encounter (Signed)
Pt seen dr [name] today and needs medical records for continuation of short term disabilities attn [place] fax number [phone] and phone [phone]. Pt also would like to know if neurologist office notes could be included. Pt seen a neurologist when she was in [facility] . Pt was in [place] from oct 7-through oct 12-2020

## 2021-03-01 NOTE — Patient Instructions (Addendum)
A few things to remember from today's visit:  Vertigo - Plan: meclizine (ANTIVERT) 12.5 MG tablet  Hyperlipidemia associated with type 2 diabetes mellitus ([place])  Essential hypertension - Plan: hydrALAZINE (APRESOLINE) 50 MG tablet  Anxiety disorder, unspecified type  If you need refills please call your pharmacy. Do not use My Chart to request refills or for acute issues that need immediate attention.   Please be sure medication list is accurate. If a new problem present, please set up appointment sooner than planned today.   [facility]  Address: [address], [place], [place] [address] Phone: [phone]      You have access to note, so you can print. Keep appt with neurologist. Please contact cardiologist's office. You need to arrange appt with Dr Loanne Drilling. Lesion on right leg could be worrisome, biopsy may need necessary ,so I am referring you to dermatologist. Fall prevention. Hydralazine changed to 50 mg tab, continue 1 tab 3 times per day instead 2 tabs of 25 mg. Triamcinolone oint on skin lesion, small amount.

## 2021-03-01 NOTE — Telephone Encounter (Signed)
Noted, will send medical records after note for today has been finished.

## 2021-03-01 NOTE — Assessment & Plan Note (Addendum)
We reviewed differential diagnosis, including positional vertigo, sequela of cerebellar CVA. Problem is chronic, so most likely she will continue having intermittent episodes.  She needs to continue avoiding activities that increase the risk for injury. Meclizine seems to help, so continue 25 mg bid prn, some side effects discussed. Fall precautions and PT to continue.

## 2021-03-01 NOTE — Assessment & Plan Note (Signed)
LDL goal < 70. Will plan on checking FLP next visit. Continue Atorvastatin 80 mg daily. Low fat diet discussed.

## 2021-03-03 ENCOUNTER — Other Ambulatory Visit: Payer: Self-pay

## 2021-03-03 ENCOUNTER — Ambulatory Visit: Payer: BC Managed Care – PPO | Attending: Internal Medicine

## 2021-03-03 VITALS — BP 146/82

## 2021-03-03 DIAGNOSIS — R2689 Other abnormalities of gait and mobility: Secondary | ICD-10-CM | POA: Diagnosis present

## 2021-03-03 DIAGNOSIS — R42 Dizziness and giddiness: Secondary | ICD-10-CM | POA: Insufficient documentation

## 2021-03-03 DIAGNOSIS — M6281 Muscle weakness (generalized): Secondary | ICD-10-CM | POA: Insufficient documentation

## 2021-03-03 DIAGNOSIS — R262 Difficulty in walking, not elsewhere classified: Secondary | ICD-10-CM | POA: Insufficient documentation

## 2021-03-03 DIAGNOSIS — R2681 Unsteadiness on feet: Secondary | ICD-10-CM | POA: Insufficient documentation

## 2021-03-03 NOTE — Telephone Encounter (Signed)
OV notes faxed  

## 2021-03-03 NOTE — Therapy (Signed)
[facility] [address] [place], [place], [address] Phone: [phone]   Fax:  [phone]  Physical Therapy Treatment  Patient Details  Name: [name] MRN: [id number] Date of Birth: 01/12/1961 Referring Provider (PT): Referred by Phillips Climes, MD. PCP is Betty [name], MD   Encounter Date: 03/03/2021   PT End of Session - 03/03/21 1101     Visit Number 3    Number of Visits 17    Date for PT Re-Evaluation 04/23/21    Authorization Type BCBS    PT Start Time 1102    PT Stop Time 1143    PT Time Calculation (min) 41 min    Equipment Utilized During Treatment Gait belt    Activity Tolerance Patient tolerated treatment well    Behavior During Therapy WFL for tasks assessed/performed             Past Medical History:  Diagnosis Date   Abnormality of gait 05/10/2010   BACK PAIN 11/14/2008   Class 1 obesity due to excess calories with body mass index (BMI) of 31.0 to 31.9 in adult 02/07/2021   DIABETES MELLITUS, TYPE II 07/15/2008   Diplopia 07/15/2008   ECZEMA, ATOPIC 04/03/2009   Guillain-Barre ([place]) 1988   HYPERLIPIDEMIA 03/06/2009   HYPERTENSION 07/15/2008   Stroke ([place]) 2010, 2011   x2    Vertebral artery stenosis     Past Surgical History:  Procedure Laterality Date   ABDOMINAL HYSTERECTOMY     DILATION AND CURETTAGE OF UTERUS     FOOT SURGERY      Vitals:   03/03/21 1106 03/03/21 1145  BP: (!) 142/78 (!) 146/82     Subjective Assessment - 03/03/21 1102     Subjective Reports she is doing okay. Reports she has been having some dizziness, most notable in the car when it makes a sharp movement. Reports it is getting better. Saw her PCP and reports that visit went well.    Patient is accompained by: Family member   family member in the lobby   Pertinent History CVA, Vertebral Artery Stenosis, HLD, HTN, Guillain - Barre, DM Type 2, Obesity, lymphoma    Limitations  Standing;Walking;House hold activities    How long can you walk comfortably? 30 minutes    Diagnostic tests Pt found to have acute/subacute infarcts to L pons and occlusion to L vertebral artery    Patient Stated Goals Improve the strength    Currently in Pain? No/denies    Pain Onset --                               [facility] Adult PT Treatment/Exercise - 03/03/21 0001       Transfers   Transfers Sit to Stand;Stand to Sit    Sit to Stand 5: Supervision    Stand to Sit 5: Supervision    Number of Reps 10 reps;2 sets    Comments completed sit <> stands, 2 x 10 reps without UE support. mild fatigue with completion.      Ambulation/Gait   Ambulation/Gait Yes    Ambulation/Gait Assistance 5: Supervision    Ambulation/Gait Assistance Details Completed ambulation x 345 ft with SPC, patient demo improved balance vs. without AD. PT continue to provide cues to look forward and scan environment.    Ambulation Distance (Feet) 345 Feet    Assistive device Straight cane    Gait Pattern Step-through pattern;Decreased stance time -  left;Decreased dorsiflexion - left    Ambulation Surface Level;Indoor      Neuro Re-ed    Neuro Re-ed Details  standing at countertop with orange hurdles/obstacles completed reciprocal stepping over x 4 laps down and back, with intermittent touchA. CGA from PT. Then progressed to lateral side stepping x 4 laps, able to complete without UE support, one instance of imbalance requiring UE support required. Cues for step length.                 Balance Exercises - 03/03/21 0001       Balance Exercises: Standing   Standing Eyes Opened Wide (BOA);Head turns;Foam/compliant surface;Limitations    Standing Eyes Opened Limitations on airex, feet hip width completed horizontal/vertical head turns x 10 reps each direction. more sway noted with horizontal > vertical.    Standing Eyes Closed Wide (BOA);Foam/compliant surface;3 reps;30 secs;Limitations     Standing Eyes Closed Limitations 3 x 30 seconds with feet shoulder width, mild sway. Pt able to self correct    SLS with Vectors Foam/compliant surface;Intermittent upper extremity assist;Limitations    SLS with Vectors Limitations standing on airex: completed alternating toe taps to cones with single UE initially x 10 reps bilat, then progressed to no UE support and completed 2 x 5 reps bilaterally. CGA but able to maintain balance without assistance. Cues for control with completion.    Other Standing Exercises Comments intermittent rest breaks required due to fatigue.                  PT Short Term Goals - 02/22/21 1257       PT SHORT TERM GOAL #1   Title Patient will be independent with initial HEP for strength/balance (ALL STGs Due: 03/26/21)    Baseline no HEP established    Time 4    Period Weeks    Status New    Target Date 03/26/21      PT SHORT TERM GOAL #2   Title Patient will be able to ambulate >/= 500 ft on indoor surfaces w/ LRAD vs. no AD at Mod I level    Baseline supervision/CGA, 115 ft    Time 4    Period Weeks    Status New      PT SHORT TERM GOAL #3   Title Patient will improve gait speed to >/= 2.5 ft/sec    Baseline 2.21 ft/sec    Time 4    Period Weeks    Status New      PT SHORT TERM GOAL #4   Title Patient will improve Berg Balance to >/= 45/56 to demo reduced fall risk and improved balance.    Baseline 40/56    Time 4    Period Weeks    Status New               PT Long Term Goals - 02/22/21 1300       PT LONG TERM GOAL #1   Title Patient will be independent with final HEP for strength/balance and report completion >/= 5x/week for compliance upon d/c (ALL LTGs Due: 04/23/21)    Baseline no HEP established    Time 8    Period Weeks    Status New    Target Date 04/23/21      PT LONG TERM GOAL #2   Title Patient will improve gait speed to >/= 3.0 ft/sec to demo improved community mobility    Baseline 2.21 ft/sec    Time 8  Period Weeks    Status New      PT LONG TERM GOAL #3   Title Patient will improve 5x sit <> stand to </= 12 seconds to demo improved balance and reduced fall risk    Baseline 17.28 secs    Time 8    Period Weeks    Status New      PT LONG TERM GOAL #4   Title Patient will improve Berg Balance to >/= 50/56 to demo reduced fall risk and improved balance    Baseline 40/56    Time 8    Period Weeks    Status New      PT LONG TERM GOAL #5   Title Patient will be able to ambulate to >/= 1000 ft outdoors on unlevel surfaces with Mod I and LRAD    Baseline TBA    Time 8    Period Weeks    Status New                   Plan - 03/03/21 1200     Clinical Impression Statement Continued gait training with [place] working on increased distances and scanning environment, as well as obstacle negotiation. Continued high level balance with focus on complaints surfaces and SLS activities. patient tolerating well, just required intermittent rest breaks due to fatigue. Will continue to progress toward all LTGs.    Personal Factors and Comorbidities Comorbidity 3+    Comorbidities CVA, Vertebral Artery Stenosis, HLD, HTN, Guillain - Barre, DM Type 2, Obesity, lymphoma    Examination-Activity Limitations Stairs;Stand;Locomotion Level;Bend;Squat    Examination-Participation Restrictions Occupation;Cleaning;Driving;Community Activity    Stability/Clinical Decision Making Evolving/Moderate complexity    Rehab Potential Good    PT Frequency 2x / week    PT Duration 8 weeks    PT Treatment/Interventions ADLs/Self Care Home Management;Aquatic Therapy;Canalith Repostioning;Biofeedback;Moist Heat;Electrical Stimulation;Cryotherapy;DME Instruction;Gait training;Stair training;Functional mobility training;Therapeutic activities;Therapeutic exercise;Balance training;Neuromuscular re-education;Patient/family education;Orthotic Fit/Training;Manual techniques;Passive range of motion;Vestibular    PT Next Visit  Plan Continue with balance training on compliant surfaces, dynamic gait activities. Gait on varied surfaces with cane.    Consulted and Agree with Plan of Care Patient             Patient will benefit from skilled therapeutic intervention in order to improve the following deficits and impairments:  Abnormal gait, Decreased balance, Decreased endurance, Difficulty walking, Dizziness, Impaired sensation, Decreased range of motion, Decreased activity tolerance, Decreased coordination, Decreased knowledge of use of DME, Decreased strength  Visit Diagnosis: Other abnormalities of gait and mobility  Muscle weakness (generalized)  Unsteadiness on feet  Difficulty in walking, not elsewhere classified  Dizziness and giddiness     Problem List Patient Active Problem List   Diagnosis Date Noted   Atherosclerosis of aorta ([place]) 02/14/2021   Cerebral thrombosis with cerebral infarction 02/08/2021   Abnormal MRI of head 02/07/2021   Vertigo 02/07/2021   Class 1 obesity due to excess calories with body mass index (BMI) of 31.0 to 31.9 in adult 02/07/2021   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   Trigger finger, left ring finger 10/04/2018   Alternating constipation and diarrhea 04/20/2018   Diabetic peripheral neuropathy associated with type 2 diabetes mellitus ([place]) 02/26/2018   DM (diabetes mellitus), type 2 with peripheral vascular complications ([place]) 15/40/0867   ABNORMALITY OF GAIT 05/10/2010   ECZEMA, ATOPIC 04/03/2009   Hyperlipidemia associated with type 2 diabetes mellitus ([place]) 03/06/2009   BACK PAIN 11/14/2008   Cerebrovascular disease 08/05/2008   DIPLOPIA  07/15/2008   Essential hypertension 07/15/2008    Jones Bales, PT, DPT 03/03/2021, 12:02 PM  [facility] [address] [place], [place], [address] Phone: [phone]   Fax:  [phone]  Name: [name] MRN: [id number] Date of Birth:  [birth year]

## 2021-03-04 NOTE — Progress Notes (Signed)
Cardiology Office Note:    Date:  03/04/2021   ID:  [name], DOB 1960/09/13, MRN [id number]  PCP:  [name], [name], MD   [facility] Providers Cardiologist:  None     Referring MD: [name], [name], MD   No chief complaint on file. Chest discomfort  History of Present Illness:    [name] is a 60 y.o. female with a hx of T2DM, stroke 02/07/2021 referral for chest discomfort.    She notes that she some discomfort in the chest. It comes and goes. She's a custodian and heavy lifting. She feels it beating through the ears. The discomfort does not last too long. Sometimes the pain goes down the right arm. It can happen with activity and rest.  No pain with lifting. She has dizzy spells, she was told related to cerebrovascular dx. If she goes up a few flight of stairs and goes too fast and feels some SOB. She denies syncope. She denies LE edema.  No orthopnea or PND. Has some residual left leg weakness. She gets some tightening of her legs with walking. She denies smoking. Her mother had a stroke. Her older sister had heart failure and scleroderma, as well as T2DM.  In terms of her stroke, see D/C summar 10/8-10/01/2021. She was placed on DAPT, with plan to stop plavix in 3 months after her stroke. She's on atorvastatin 80 mg daily. No history of smoking. Her blood pressure is well controlled on metoprolol , norvasc, hydralazine, lisinopril-HCTz..  LDL 169 not at goal which is LDL at least < 100 mg/dL.   Cardiology Studies:  TTE 02/08/2021 Normal LV function, normal RV function No valve dx Plaque in the descending aorta near the arch   10/10//2022: Carotid US: no BL significant stenosis   MRI Brain 02/07/2021 1. Two acute/early subacute infarcts within the left pons measuring up to 8 mm. 2. Otherwise stable noncontrast MRI appearance of the brain as compared to 04/09/2020. 3. Mild chronic small vessel ischemic changes within the cerebral white matter. 4. Small  chronic lacunar infarct within the left cerebellar hemisphere. 5. Incompletely imaged and incompletely assessed cervical lymphadenopathy. Extensive cerebral vascular stenosis  Past Medical History:  Diagnosis Date   Abnormality of gait 05/10/2010   BACK PAIN 11/14/2008   Class 1 obesity due to excess calories with body mass index (BMI) of 31.0 to 31.9 in adult 02/07/2021   DIABETES MELLITUS, TYPE II 07/15/2008   Diplopia 07/15/2008   ECZEMA, ATOPIC 04/03/2009   Guillain-Barre ([place]) 1988   HYPERLIPIDEMIA 03/06/2009   HYPERTENSION 07/15/2008   Stroke ([place]) 2010, 2011   x2    Vertebral artery stenosis     Past Surgical History:  Procedure Laterality Date   ABDOMINAL HYSTERECTOMY     DILATION AND CURETTAGE OF UTERUS     FOOT SURGERY      Current Medications: No outpatient medications have been marked as taking for the 03/05/21 encounter (Appointment) with Janina Mayo, MD.     Allergies:   Patient has no known allergies.   Social History   Socioeconomic History   Marital status: Single    Spouse name: Not on file   Number of children: Not on file   Years of education: Not on file   Highest education level: Not on file  Occupational History   Occupation: custodian at [employer]	[place]  Tobacco Use   Smoking status: Never   Smokeless tobacco: Never  Vaping Use   Vaping Use: Never used  Substance and Sexual Activity   Alcohol use: No   Drug use: No   Sexual activity: Not on file  Other Topics Concern   Not on file  Social History Narrative   Not on file   Social Determinants of Health   Financial Resource Strain: Not on file  Food Insecurity: Not on file  Transportation Needs: Not on file  Physical Activity: Not on file  Stress: Not on file  Social Connections: Not on file     Family History: The patient's family history includes Asthma in an other family member; Cancer in her father; Diabetes in her mother and sister; Hypertension in an other family  member; Stroke in her mother and another family member. There is no history of Breast cancer.  ROS:   Please see the history of present illness.     All other systems reviewed and are negative.  EKGs/Labs/Other Studies Reviewed:    The following studies were reviewed today:   EKG:  EKG is  ordered today.  The ekg ordered today demonstrates   NSR, LVH  Recent Labs: 08/19/2020: Brain Natriuretic Peptide 115; TSH 0.50 09/04/2020: ALT 18 02/06/2021: Hemoglobin 13.0; Platelets 257 02/07/2021: BUN 23; Creatinine, Ser 1.11; Potassium 3.8; Sodium 135  Recent Lipid Panel    Component Value Date/Time   CHOL 223 (H) 02/08/2021 0351   TRIG 93 02/08/2021 0351   TRIG 72 04/28/2006 0951   HDL 35 (L) 02/08/2021 0351   CHOLHDL 6.4 02/08/2021 0351   VLDL 19 02/08/2021 0351   LDLCALC 169 (H) 02/08/2021 0351   LDLDIRECT 175.9 03/02/2012 1128     Risk Assessment/Calculations:           Physical Exam:    VS:   Vitals:   03/05/21 0919  BP: 132/68  Pulse: 74  Resp: 18  SpO2: 96%  .  Wt Readings from Last 3 Encounters:  03/01/21 193 lb (87.5 kg)  02/15/21 193 lb (87.5 kg)  02/09/21 193 lb 3.2 oz (87.6 kg)     GEN:  Well nourished, well developed in no acute distress HEENT: Normal NECK: No JVD; No carotid bruits LYMPHATICS: No lymphadenopathy CARDIAC: RRR, no murmurs, rubs, gallops RESPIRATORY:  Clear to auscultation without rales, wheezing or rhonchi  ABDOMEN: Soft, non-tender, non-distended MUSCULOSKELETAL:  No edema; No deformity  SKIN: Warm and dry NEUROLOGIC:  Alert and oriented x 3 PSYCHIATRIC:  Normal affect   ASSESSMENT:    #Chest Discomfort:  She noted some CP with activity and has significant atherosclerotic dx. She is high risk for CVD. Will plan for nuclear stress. Her LDL is 169 mg/dL but checked just after starting atorvastatin. She noted leg pain with activity as well c/f PAD. Will plan for ABI's. Will repeal lipid profile to determine if at goal. If not, will  add zetia and then repeat. If lipids not at goal will plan for PCSK9.    PLAN:    In order of problems listed above:  Repeat Lipid profile ABI BL Nuclear stress regadenason Follow up 3 months     Shared Decision Making/Informed Consent The risks [chest pain, shortness of breath, cardiac arrhythmias, dizziness, blood pressure fluctuations, myocardial infarction, stroke/transient ischemic attack, nausea, vomiting, allergic reaction, radiation exposure, metallic taste sensation and life-threatening complications (estimated to be 1 in 10,000)], benefits (risk stratification, diagnosing coronary artery disease, treatment guidance) and alternatives of a nuclear stress test were discussed in detail with [name] and she agrees to proceed.    Medication Adjustments/Labs and Tests  Ordered: Current medicines are reviewed at length with the patient today.  Concerns regarding medicines are outlined above.    Signed, Janina Mayo, MD  03/04/2021 8:56 PM    [name]

## 2021-03-05 ENCOUNTER — Encounter: Payer: Self-pay | Admitting: Internal Medicine

## 2021-03-05 ENCOUNTER — Ambulatory Visit (INDEPENDENT_AMBULATORY_CARE_PROVIDER_SITE_OTHER): Payer: BC Managed Care – PPO | Admitting: Internal Medicine

## 2021-03-05 ENCOUNTER — Other Ambulatory Visit: Payer: Self-pay

## 2021-03-05 ENCOUNTER — Ambulatory Visit: Payer: BC Managed Care – PPO

## 2021-03-05 VITALS — BP 132/68 | HR 74 | Resp 18 | Ht 64.0 in | Wt 191.8 lb

## 2021-03-05 DIAGNOSIS — E785 Hyperlipidemia, unspecified: Secondary | ICD-10-CM

## 2021-03-05 DIAGNOSIS — M6281 Muscle weakness (generalized): Secondary | ICD-10-CM

## 2021-03-05 DIAGNOSIS — R079 Chest pain, unspecified: Secondary | ICD-10-CM

## 2021-03-05 DIAGNOSIS — C8300 Small cell B-cell lymphoma, unspecified site: Secondary | ICD-10-CM

## 2021-03-05 DIAGNOSIS — R2689 Other abnormalities of gait and mobility: Secondary | ICD-10-CM

## 2021-03-05 DIAGNOSIS — M79604 Pain in right leg: Secondary | ICD-10-CM

## 2021-03-05 DIAGNOSIS — R262 Difficulty in walking, not elsewhere classified: Secondary | ICD-10-CM

## 2021-03-05 DIAGNOSIS — M79605 Pain in left leg: Secondary | ICD-10-CM

## 2021-03-05 DIAGNOSIS — R2681 Unsteadiness on feet: Secondary | ICD-10-CM

## 2021-03-05 NOTE — Patient Instructions (Signed)
Medication Instructions:  Your physician recommends that you continue on your current medications as directed. Please refer to the Current Medication list given to you today.  *If you need a refill on your cardiac medications before your next appointment, please call your pharmacy*   Lab Work: Please return for FASTING labs (Lipid)  Our in office lab hours are Monday-Friday 8:00-4:00, closed for lunch 12:45-1:45 pm.  No appointment needed.  Testing/Procedures: Your physician has requested that you have a lexiscan myoview. For further information please visit [email or web address]. Please follow instruction sheet, as given. This will take place at [address], [address]  How to prepare for your Myocardial Perfusion Test: Do not eat or drink 3 hours prior to your test, except you may have water. Do not consume products containing caffeine (regular or decaffeinated) 12 hours prior to your test. (ex: coffee, chocolate, sodas, tea). Do bring a list of your current medications with you.  If not listed below, you may take your medications as normal. Do wear comfortable clothes (no dresses or overalls) and walking shoes, tennis shoes preferred (No heels or open toe shoes are allowed). Do NOT wear cologne, perfume, aftershave, or lotions (deodorant is allowed). The test will take approximately 3 to 4 hours to complete If these instructions are not followed, your test will have to be rescheduled.  Your physician has requested that you have an ankle brachial index (ABI). During this test an ultrasound and blood pressure cuff are used to evaluate the arteries that supply the arms and legs with blood. Allow thirty minutes for this exam. There are no restrictions or special instructions.  Follow-Up: At [facility], you and your health needs are our priority.  As part of our continuing mission to provide you with exceptional heart care, we have created designated Provider Care Teams.  These Care  Teams include your primary Cardiologist (physician) and Advanced Practice Providers (APPs -  Physician Assistants and Nurse Practitioners) who all work together to provide you with the care you need, when you need it.  We recommend signing up for the patient portal called "MyChart".  Sign up information is provided on this After Visit Summary.  MyChart is used to connect with patients for Virtual Visits (Telemedicine).  Patients are able to view lab/test results, encounter notes, upcoming appointments, etc.  Non-urgent messages can be sent to your provider as well.   To learn more about what you can do with MyChart, go to [email or web address].    Your next appointment:   3 month(s)  The format for your next appointment:   In Person  Provider:   Dr. Harl Bowie

## 2021-03-05 NOTE — Therapy (Signed)
[place] [address] [place], [place], [address] Phone: [phone]   Fax:  [phone]  Physical Therapy Treatment  Patient Details  Name: Rosalea Withrow MRN: [id number] Date of Birth: May 07, 1960 Referring Provider (PT): Referred by Phillips Climes, MD. PCP is Betty [name], MD   Encounter Date: 03/05/2021   PT End of Session - 03/05/21 1103     Visit Number 4    Number of Visits 17    Date for PT Re-Evaluation 04/23/21    Authorization Type BCBS    PT Start Time 1102    PT Stop Time 3825    PT Time Calculation (min) 43 min    Equipment Utilized During Treatment Gait belt    Activity Tolerance Patient tolerated treatment well    Behavior During Therapy [facility] for tasks assessed/performed             Past Medical History:  Diagnosis Date   Abnormality of gait 05/10/2010   BACK PAIN 11/14/2008   Class 1 obesity due to excess calories with body mass index (BMI) of 31.0 to 31.9 in adult 02/07/2021   DIABETES MELLITUS, TYPE II 07/15/2008   Diplopia 07/15/2008   ECZEMA, ATOPIC 04/03/2009   Guillain-Barre ([place]) 1988   HYPERLIPIDEMIA 03/06/2009   HYPERTENSION 07/15/2008   Stroke ([place]) 2010, 2011   x2    Vertebral artery stenosis     Past Surgical History:  Procedure Laterality Date   ABDOMINAL HYSTERECTOMY     DILATION AND CURETTAGE OF UTERUS     FOOT SURGERY      There were no vitals filed for this visit.   Subjective Assessment - 03/05/21 1103     Subjective Saw cardiologist this morning, reports cholesterol was high. Patient reports she will have a stress test completed later this month. No falls. No pain.    Patient is accompained by: Family member   family member in the lobby   Pertinent History CVA, Vertebral Artery Stenosis, HLD, HTN, Guillain - Barre, DM Type 2, Obesity, lymphoma    Limitations Standing;Walking;House hold activities    How long can you walk comfortably? 30 minutes     Diagnostic tests Pt found to have acute/subacute infarcts to L pons and occlusion to L vertebral artery    Patient Stated Goals Improve the strength    Currently in Pain? No/denies             [facility] Adult PT Treatment/Exercise - 03/05/21 0001       Transfers   Transfers Sit to Stand;Stand to Sit    Sit to Stand 5: Supervision    Stand to Sit 5: Supervision      Ambulation/Gait   Ambulation/Gait Yes    Ambulation/Gait Assistance 5: Supervision;4: Min guard    Ambulation/Gait Assistance Details Completed ambulation outdoors on paved surfaces and on gravel surfaces. Supervision with use of SPC on paved, CGA on gravel just due to unsteadiness. PT providing cues for proper sequencing and improved step length. Continue to have preference for downward gaze during gait, cues for looking forwards to scanning environment.    Ambulation Distance (Feet) 300 Feet    Assistive device Straight cane    Gait Pattern Step-through pattern;Decreased stance time - left;Decreased dorsiflexion - left    Ambulation Surface Level;Indoor;Unlevel;Outdoor;Paved;Gravel    Gait Comments Patient reports that family member needs the current [facility] she is using back. Therefore PT educating on purchase options for [facility]. Paitent brings in RW, but PT stating that  she does not need this level of assistance at this time.      Exercises   Exercises Knee/Hip      Knee/Hip Exercises: Aerobic   Other Aerobic Completed SciFit on Level 2.0 x 6 minutes with BUE/BLE. Patient tolerating well. Mild fatigue but resolved with seated rest break.      Knee/Hip Exercises: Standing   Forward Step Up Both;1 set;Hand Hold: 2;Step Height: 6";15 reps    Forward Step Up Limitations standing at stairs with BUE support completd alteranting step ups with BLE x 15 reps. Cues for alternating pattern and upright posture.              Balance Exercises - 03/05/21 0001       Balance Exercises: Standing   Stepping Strategy  Anterior;Foam/compliant surface;Limitations    Stepping Strategy Limitations standing across blue balance beam completed alternating steps forward/back onto balance beam, x 15 reps bilaterally. increased challenge with clearance requiring cues for step lenth.    Balance Beam standing across blue balance beam with EO 2 x 30 seconds working on maintaining balance, then EC 3 x 30 seconds increased postural sway. then with EO completed horizontal/vertical head turn x 10 reps each. intermittent CGA and touchA to // bars    Sidestepping Foam/compliant support;Limitations    Sidestepping Limitations completed side stepping down and back x 3 laps in // bars on blue balance beam, completed initially with UE support then progressed to no UE support    Sit to Stand Standard surface;Foam/compliant surface;Limitations    Sit to Stand Limitations Completed x 10 reps with BLE placed on airex. Cues for upright posture. BUE pushing from knees.                PT Education - 03/05/21 1138     Education Details educated on purchase options for [facility]    Person(s) Educated Patient    Methods Explanation    Comprehension Verbalized understanding              PT Short Term Goals - 02/22/21 1257       PT SHORT TERM GOAL #1   Title Patient will be independent with initial HEP for strength/balance (ALL STGs Due: 03/26/21)    Baseline no HEP established    Time 4    Period Weeks    Status New    Target Date 03/26/21      PT SHORT TERM GOAL #2   Title Patient will be able to ambulate >/= 500 ft on indoor surfaces w/ LRAD vs. no AD at Mod I level    Baseline supervision/CGA, 115 ft    Time 4    Period Weeks    Status New      PT SHORT TERM GOAL #3   Title Patient will improve gait speed to >/= 2.5 ft/sec    Baseline 2.21 ft/sec    Time 4    Period Weeks    Status New      PT SHORT TERM GOAL #4   Title Patient will improve Berg Balance to >/= 45/56 to demo reduced fall risk and improved  balance.    Baseline 40/56    Time 4    Period Weeks    Status New               PT Long Term Goals - 02/22/21 1300       PT LONG TERM GOAL #1   Title Patient will be independent with final  HEP for strength/balance and report completion >/= 5x/week for compliance upon d/c (ALL LTGs Due: 04/23/21)    Baseline no HEP established    Time 8    Period Weeks    Status New    Target Date 04/23/21      PT LONG TERM GOAL #2   Title Patient will improve gait speed to >/= 3.0 ft/sec to demo improved community mobility    Baseline 2.21 ft/sec    Time 8    Period Weeks    Status New      PT LONG TERM GOAL #3   Title Patient will improve 5x sit <> stand to </= 12 seconds to demo improved balance and reduced fall risk    Baseline 17.28 secs    Time 8    Period Weeks    Status New      PT LONG TERM GOAL #4   Title Patient will improve Berg Balance to >/= 50/56 to demo reduced fall risk and improved balance    Baseline 40/56    Time 8    Period Weeks    Status New      PT LONG TERM GOAL #5   Title Patient will be able to ambulate to >/= 1000 ft outdoors on unlevel surfaces with Mod I and LRAD    Baseline TBA    Time 8    Period Weeks    Status New                   Plan - 03/05/21 1152     Clinical Impression Statement Today's skilled PT session focused on continud balance/strengthening to patient's tolerance. Intermittent rest breaks required due to fatigue. Initiated endurance training on Scifit with patient tolerating well. Patient did demo some mild imbalance outdoors on unlevel surfaces with SPC. Patient needs to return [facility] to family member, therefore PT educating on purchase options to obtain personal AD. Will continue to progress toward all LTGs.    Personal Factors and Comorbidities Comorbidity 3+    Comorbidities CVA, Vertebral Artery Stenosis, HLD, HTN, Guillain - Barre, DM Type 2, Obesity, lymphoma    Examination-Activity Limitations  Stairs;Stand;Locomotion Level;Bend;Squat    Examination-Participation Restrictions Occupation;Cleaning;Driving;Community Activity    Stability/Clinical Decision Making Evolving/Moderate complexity    Rehab Potential Good    PT Frequency 2x / week    PT Duration 8 weeks    PT Treatment/Interventions ADLs/Self Care Home Management;Aquatic Therapy;Canalith Repostioning;Biofeedback;Moist Heat;Electrical Stimulation;Cryotherapy;DME Instruction;Gait training;Stair training;Functional mobility training;Therapeutic activities;Therapeutic exercise;Balance training;Neuromuscular re-education;Patient/family education;Orthotic Fit/Training;Manual techniques;Passive range of motion;Vestibular    PT Next Visit Plan Continue with balance training on compliant surfaces, dynamic gait activities. Gait on varied surfaces with cane.    Consulted and Agree with Plan of Care Patient             Patient will benefit from skilled therapeutic intervention in order to improve the following deficits and impairments:  Abnormal gait, Decreased balance, Decreased endurance, Difficulty walking, Dizziness, Impaired sensation, Decreased range of motion, Decreased activity tolerance, Decreased coordination, Decreased knowledge of use of DME, Decreased strength  Visit Diagnosis: Other abnormalities of gait and mobility  Muscle weakness (generalized)  Unsteadiness on feet  Difficulty in walking, not elsewhere classified     Problem List Patient Active Problem List   Diagnosis Date Noted   Atherosclerosis of aorta ([place]) 02/14/2021   Cerebral thrombosis with cerebral infarction 02/08/2021   Abnormal MRI of head 02/07/2021   Vertigo 02/07/2021   Class 1 obesity due  to excess calories with body mass index (BMI) of 31.0 to 31.9 in adult 02/07/2021   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   Trigger finger, left ring finger 10/04/2018   Alternating constipation and diarrhea 04/20/2018   Diabetic peripheral  neuropathy associated with type 2 diabetes mellitus ([place]) 02/26/2018   DM (diabetes mellitus), type 2 with peripheral vascular complications ([place]) 43/15/4008   ABNORMALITY OF GAIT 05/10/2010   ECZEMA, ATOPIC 04/03/2009   Hyperlipidemia associated with type 2 diabetes mellitus ([place]) 03/06/2009   BACK PAIN 11/14/2008   Cerebrovascular disease 08/05/2008   DIPLOPIA 07/15/2008   Essential hypertension 07/15/2008    Jones Bales, PT, DPT 03/05/2021, 11:56 AM  [place] [address] [place], [place], [address] Phone: [phone]   Fax:  [phone]  Name: Easter Kennebrew MRN: [id number] Date of Birth: 07/11/1960

## 2021-03-08 ENCOUNTER — Inpatient Hospital Stay: Payer: BC Managed Care – PPO

## 2021-03-08 ENCOUNTER — Inpatient Hospital Stay: Payer: BC Managed Care – PPO | Attending: Hematology | Admitting: Hematology

## 2021-03-08 ENCOUNTER — Other Ambulatory Visit: Payer: Self-pay

## 2021-03-08 VITALS — BP 145/84 | HR 80 | Temp 98.3°F | Resp 17 | Ht 64.0 in | Wt 191.4 lb

## 2021-03-08 DIAGNOSIS — Z8572 Personal history of non-Hodgkin lymphomas: Secondary | ICD-10-CM | POA: Diagnosis present

## 2021-03-08 DIAGNOSIS — C8300 Small cell B-cell lymphoma, unspecified site: Secondary | ICD-10-CM

## 2021-03-08 DIAGNOSIS — R718 Other abnormality of red blood cells: Secondary | ICD-10-CM

## 2021-03-08 DIAGNOSIS — D649 Anemia, unspecified: Secondary | ICD-10-CM | POA: Insufficient documentation

## 2021-03-08 LAB — CBC WITH DIFFERENTIAL (CANCER CENTER ONLY)
Abs Immature Granulocytes: 0.01 10*3/uL (ref 0.00–0.07)
Basophils Absolute: 0 10*3/uL (ref 0.0–0.1)
Basophils Relative: 1 %
Eosinophils Absolute: 0.2 10*3/uL (ref 0.0–0.5)
Eosinophils Relative: 4 %
HCT: 36.4 % (ref 36.0–46.0)
Hemoglobin: 11.9 g/dL — ABNORMAL LOW (ref 12.0–15.0)
Immature Granulocytes: 0 %
Lymphocytes Relative: 41 %
Lymphs Abs: 2.1 10*3/uL (ref 0.7–4.0)
MCH: 25.7 pg — ABNORMAL LOW (ref 26.0–34.0)
MCHC: 32.7 g/dL (ref 30.0–36.0)
MCV: 78.6 fL — ABNORMAL LOW (ref 80.0–100.0)
Monocytes Absolute: 0.6 10*3/uL (ref 0.1–1.0)
Monocytes Relative: 13 %
Neutro Abs: 2.1 10*3/uL (ref 1.7–7.7)
Neutrophils Relative %: 41 %
Platelet Count: 263 10*3/uL (ref 150–400)
RBC: 4.63 MIL/uL (ref 3.87–5.11)
RDW: 14.5 % (ref 11.5–15.5)
WBC Count: 5.1 10*3/uL (ref 4.0–10.5)
nRBC: 0 % (ref 0.0–0.2)

## 2021-03-08 LAB — CMP (CANCER CENTER ONLY)
ALT: 20 U/L (ref 0–44)
AST: 15 U/L (ref 15–41)
Albumin: 3.3 g/dL — ABNORMAL LOW (ref 3.5–5.0)
Alkaline Phosphatase: 98 U/L (ref 38–126)
Anion gap: 7 (ref 5–15)
BUN: 30 mg/dL — ABNORMAL HIGH (ref 8–23)
CO2: 25 mmol/L (ref 22–32)
Calcium: 9.7 mg/dL (ref 8.9–10.3)
Chloride: 106 mmol/L (ref 98–111)
Creatinine: 1.15 mg/dL — ABNORMAL HIGH (ref 0.44–1.00)
GFR, Estimated: 54 mL/min — ABNORMAL LOW (ref >60)
Glucose, Bld: 268 mg/dL — ABNORMAL HIGH (ref 70–99)
Potassium: 4 mmol/L (ref 3.5–5.1)
Sodium: 138 mmol/L (ref 135–145)
Total Bilirubin: 0.4 mg/dL (ref 0.3–1.2)
Total Protein: 7.4 g/dL (ref 6.5–8.1)

## 2021-03-08 LAB — LACTATE DEHYDROGENASE: LDH: 188 U/L (ref 98–192)

## 2021-03-09 ENCOUNTER — Encounter: Payer: Self-pay | Admitting: Gastroenterology

## 2021-03-09 ENCOUNTER — Other Ambulatory Visit (HOSPITAL_COMMUNITY): Payer: Self-pay | Admitting: *Deleted

## 2021-03-09 ENCOUNTER — Encounter: Payer: Self-pay | Admitting: Endocrinology

## 2021-03-10 ENCOUNTER — Ambulatory Visit: Payer: BC Managed Care – PPO

## 2021-03-10 ENCOUNTER — Other Ambulatory Visit: Payer: Self-pay

## 2021-03-10 DIAGNOSIS — R42 Dizziness and giddiness: Secondary | ICD-10-CM

## 2021-03-10 DIAGNOSIS — R2689 Other abnormalities of gait and mobility: Secondary | ICD-10-CM | POA: Diagnosis not present

## 2021-03-10 DIAGNOSIS — M6281 Muscle weakness (generalized): Secondary | ICD-10-CM

## 2021-03-10 DIAGNOSIS — R262 Difficulty in walking, not elsewhere classified: Secondary | ICD-10-CM

## 2021-03-10 DIAGNOSIS — R2681 Unsteadiness on feet: Secondary | ICD-10-CM

## 2021-03-10 NOTE — Patient Instructions (Signed)
Access Code: [email or web address] URL: [email or web address] Date: 03/10/2021 Prepared by: Baldomero Lamy  Exercises Standing on foam eyes open - 2 x daily - 5 x weekly - 1 sets - 2 reps - 30 sec hold Romberg Stance Eyes Closed on Foam Pad - 2 x daily - 5 x weekly - 1 sets - 2 reps - 30 sec hold Walking March - 1 x daily - 5 x weekly - 2 sets - 3-5 reps Side Stepping with Counter Support - 1 x daily - 5 x weekly - 2 sets - 3-5 reps Standing Gastroc Stretch at Counter - 1 x daily - 5 x weekly - 1 sets - 3 reps - 30 seconds hold Seated Hamstring Stretch - 1 x daily - 5 x weekly - 1 sets - 3 reps - 30 seconds hold

## 2021-03-10 NOTE — Therapy (Signed)
[facility] [address] [place], [place], [address] Phone: [phone]   Fax:  [phone]  Physical Therapy Treatment  Patient Details  Name: [name] MRN: [id number] Date of Birth: 12-24-1960 Referring Provider (PT): Referred by Phillips Climes, MD. PCP is Betty [name], MD   Encounter Date: 03/10/2021   PT End of Session - 03/10/21 1114     Visit Number 5    Number of Visits 17    Date for PT Re-Evaluation 04/23/21    Authorization Type BCBS    PT Start Time 1113   patient arrived late   PT Stop Time 1144    PT Time Calculation (min) 31 min    Equipment Utilized During Treatment Gait belt    Activity Tolerance Patient tolerated treatment well    Behavior During Therapy [facility] for tasks assessed/performed             Past Medical History:  Diagnosis Date   Abnormality of gait 05/10/2010   BACK PAIN 11/14/2008   Class 1 obesity due to excess calories with body mass index (BMI) of 31.0 to 31.9 in adult 02/07/2021   DIABETES MELLITUS, TYPE II 07/15/2008   Diplopia 07/15/2008   ECZEMA, ATOPIC 04/03/2009   Guillain-Barre ([place]) 1988   HYPERLIPIDEMIA 03/06/2009   HYPERTENSION 07/15/2008   Stroke ([place]) 2010, 2011   x2    Vertebral artery stenosis     Past Surgical History:  Procedure Laterality Date   ABDOMINAL HYSTERECTOMY     DILATION AND CURETTAGE OF UTERUS     FOOT SURGERY      There were no vitals filed for this visit.   Subjective Assessment - 03/10/21 1114     Subjective Arrived late due to eye doctor appt,. "I have to get glasses" Reports that she is still having leaviness in the L leg. No falls.    Patient is accompained by: Family member   family member in the lobby   Pertinent History CVA, Vertebral Artery Stenosis, HLD, HTN, Guillain - Barre, DM Type 2, Obesity, lymphoma    Limitations Standing;Walking;House hold activities    How long can you walk comfortably? 30 minutes     Diagnostic tests Pt found to have acute/subacute infarcts to L pons and occlusion to L vertebral artery    Patient Stated Goals Improve the strength    Currently in Pain? No/denies                               [facility] Adult PT Treatment/Exercise - 03/10/21 0001       Transfers   Transfers Sit to Stand;Stand to Sit    Sit to Stand 5: Supervision    Stand to Sit 5: Supervision      Ambulation/Gait   Ambulation/Gait Yes    Ambulation/Gait Assistance 5: Supervision    Ambulation/Gait Assistance Details ambulation throughout therapy session with activities    Assistive device Straight cane    Gait Pattern Step-through pattern;Decreased stance time - left;Decreased dorsiflexion - left    Ambulation Surface Level;Indoor      Exercises   Exercises Knee/Hip      Knee/Hip Exercises: Stretches   Passive Hamstring Stretch Both;3 reps;30 seconds;Limitations    Passive Hamstring Stretch Limitations educatd on proper form/technqiue. added to HEP    Gastroc Stretch Both;2 reps;30 seconds;Limitations    Gastroc Stretch Limitations standing at countertop. educated on proper form/technqiue. added to HEP  Knee/Hip Exercises: Aerobic   Nustep Completed NuStep with BUE/BLE on Level 5 x minutes for improved endurance/activity tolerance.      Knee/Hip Exercises: Standing   Heel Raises Both;1 set;10 reps;Limitations    Heel Raises Limitations light UE support at countertop    Other Standing Knee Exercises at countertop with light UE support completed toe raises x 10 reps, cues to avoid rocking.                 Balance Exercises - 03/10/21 0001       Balance Exercises: Standing   Standing Eyes Opened Narrow base of support (BOS);Head turns;Foam/compliant surface    Standing Eyes Opened Limitations standing narrow BOS on airex, horizontal/vertical head turns x 10 reps each. more challenge with horiz > vertical. intermittent touch A to chair    Standing Eyes Closed  Wide (BOA);Foam/compliant surface;3 reps;30 secs;Limitations    Standing Eyes Closed Limitations x 30 seconds with feet shoulder width, mild sway. trialed to progress toward narrow BOS x 30 seconds intermitent touch A required.    Marching Foam/compliant surface;Intermittent upper extremity assist;Static;Limitations    Marching Limitations standing on airex starting with single UE support, completed static marching x 10 reps, then without UE support x 10 reps.    Other Standing Exercises Comments intermittent rest breaks required due to fatigue.                PT Education - 03/10/21 1140     Education Details updated HEP; front providing information regarding transportation    Person(s) Educated Patient    Methods Explanation;Handout;Demonstration    Comprehension Verbalized understanding;Returned demonstration              PT Short Term Goals - 02/22/21 1257       PT SHORT TERM GOAL #1   Title Patient will be independent with initial HEP for strength/balance (ALL STGs Due: 03/26/21)    Baseline no HEP established    Time 4    Period Weeks    Status New    Target Date 03/26/21      PT SHORT TERM GOAL #2   Title Patient will be able to ambulate >/= 500 ft on indoor surfaces w/ LRAD vs. no AD at Mod I level    Baseline supervision/CGA, 115 ft    Time 4    Period Weeks    Status New      PT SHORT TERM GOAL #3   Title Patient will improve gait speed to >/= 2.5 ft/sec    Baseline 2.21 ft/sec    Time 4    Period Weeks    Status New      PT SHORT TERM GOAL #4   Title Patient will improve Berg Balance to >/= 45/56 to demo reduced fall risk and improved balance.    Baseline 40/56    Time 4    Period Weeks    Status New               PT Long Term Goals - 02/22/21 1300       PT LONG TERM GOAL #1   Title Patient will be independent with final HEP for strength/balance and report completion >/= 5x/week for compliance upon d/c (ALL LTGs Due: 04/23/21)     Baseline no HEP established    Time 8    Period Weeks    Status New    Target Date 04/23/21      PT LONG TERM GOAL #2  Title Patient will improve gait speed to >/= 3.0 ft/sec to demo improved community mobility    Baseline 2.21 ft/sec    Time 8    Period Weeks    Status New      PT LONG TERM GOAL #3   Title Patient will improve 5x sit <> stand to </= 12 seconds to demo improved balance and reduced fall risk    Baseline 17.28 secs    Time 8    Period Weeks    Status New      PT LONG TERM GOAL #4   Title Patient will improve Berg Balance to >/= 50/56 to demo reduced fall risk and improved balance    Baseline 40/56    Time 8    Period Weeks    Status New      PT LONG TERM GOAL #5   Title Patient will be able to ambulate to >/= 1000 ft outdoors on unlevel surfaces with Mod I and LRAD    Baseline TBA    Time 8    Period Weeks    Status New                   Plan - 03/10/21 1120     Clinical Impression Statement Today's skilled PT session foucsed on continued activity tolernace/endurance and strength training for LLE > RLE. Paitent tolerating activities well with intermittent rest breaks due to fatigue. Added stretches to HEP due to tightness in lower extremity.  Continue to use SPC at this time. Will continue per POC.    Personal Factors and Comorbidities Comorbidity 3+    Comorbidities CVA, Vertebral Artery Stenosis, HLD, HTN, Guillain - Barre, DM Type 2, Obesity, lymphoma    Examination-Activity Limitations Stairs;Stand;Locomotion Level;Bend;Squat    Examination-Participation Restrictions Occupation;Cleaning;Driving;Community Activity    Stability/Clinical Decision Making Evolving/Moderate complexity    Rehab Potential Good    PT Frequency 2x / week    PT Duration 8 weeks    PT Treatment/Interventions ADLs/Self Care Home Management;Aquatic Therapy;Canalith Repostioning;Biofeedback;Moist Heat;Electrical Stimulation;Cryotherapy;DME Instruction;Gait training;Stair  training;Functional mobility training;Therapeutic activities;Therapeutic exercise;Balance training;Neuromuscular re-education;Patient/family education;Orthotic Fit/Training;Manual techniques;Passive range of motion;Vestibular    PT Next Visit Plan how was additions to HEP? Continue with balance training on compliant surfaces, dynamic gait activities. Gait on varied surfaces with cane. scifit/nuste for endurance training.    Consulted and Agree with Plan of Care Patient             Patient will benefit from skilled therapeutic intervention in order to improve the following deficits and impairments:  Abnormal gait, Decreased balance, Decreased endurance, Difficulty walking, Dizziness, Impaired sensation, Decreased range of motion, Decreased activity tolerance, Decreased coordination, Decreased knowledge of use of DME, Decreased strength  Visit Diagnosis: Other abnormalities of gait and mobility  Muscle weakness (generalized)  Unsteadiness on feet  Dizziness and giddiness  Difficulty in walking, not elsewhere classified     Problem List Patient Active Problem List   Diagnosis Date Noted   Atherosclerosis of aorta ([place]) 02/14/2021   Cerebral thrombosis with cerebral infarction 02/08/2021   Abnormal MRI of head 02/07/2021   Vertigo 02/07/2021   Class 1 obesity due to excess calories with body mass index (BMI) of 31.0 to 31.9 in adult 02/07/2021   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   Trigger finger, left ring finger 10/04/2018   Alternating constipation and diarrhea 04/20/2018   Diabetic peripheral neuropathy associated with type 2 diabetes mellitus ([place]) 02/26/2018   DM (diabetes mellitus), type 2 with  peripheral vascular complications ([place]) 52/59/1028   ABNORMALITY OF GAIT 05/10/2010   ECZEMA, ATOPIC 04/03/2009   Hyperlipidemia associated with type 2 diabetes mellitus ([place]) 03/06/2009   BACK PAIN 11/14/2008   Cerebrovascular disease 08/05/2008   DIPLOPIA 07/15/2008    Essential hypertension 07/15/2008    Jones Bales, PT, DPT 03/10/2021, 11:47 AM  [facility] [address] [place], [place], [address] Phone: [phone]   Fax:  [phone]  Name: [name] MRN: [id number] Date of Birth: [birth year]

## 2021-03-12 ENCOUNTER — Ambulatory Visit: Payer: BC Managed Care – PPO

## 2021-03-12 ENCOUNTER — Other Ambulatory Visit: Payer: Self-pay

## 2021-03-12 DIAGNOSIS — R2689 Other abnormalities of gait and mobility: Secondary | ICD-10-CM | POA: Diagnosis not present

## 2021-03-12 DIAGNOSIS — R262 Difficulty in walking, not elsewhere classified: Secondary | ICD-10-CM

## 2021-03-12 DIAGNOSIS — R42 Dizziness and giddiness: Secondary | ICD-10-CM

## 2021-03-12 DIAGNOSIS — R2681 Unsteadiness on feet: Secondary | ICD-10-CM

## 2021-03-12 DIAGNOSIS — M6281 Muscle weakness (generalized): Secondary | ICD-10-CM

## 2021-03-12 NOTE — Therapy (Signed)
[facility] [address] [place], [place], [address] Phone: [phone]   Fax:  [phone]  Physical Therapy Treatment  Patient Details  Name: [name] MRN: [id number] Date of Birth: 09/11/1960 Referring Provider (PT): Referred by Phillips Climes, MD. PCP is Betty [name], MD   Encounter Date: 03/12/2021   PT End of Session - 03/12/21 1119     Visit Number 6    Number of Visits 17    Date for PT Re-Evaluation 04/23/21    Authorization Type BCBS    PT Start Time 1019    PT Stop Time 1100    PT Time Calculation (min) 41 min    Equipment Utilized During Treatment Gait belt    Activity Tolerance Patient tolerated treatment well    Behavior During Therapy [facility] for tasks assessed/performed             Past Medical History:  Diagnosis Date   Abnormality of gait 05/10/2010   BACK PAIN 11/14/2008   Class 1 obesity due to excess calories with body mass index (BMI) of 31.0 to 31.9 in adult 02/07/2021   DIABETES MELLITUS, TYPE II 07/15/2008   Diplopia 07/15/2008   ECZEMA, ATOPIC 04/03/2009   Guillain-Barre ([place]) 1988   HYPERLIPIDEMIA 03/06/2009   HYPERTENSION 07/15/2008   Stroke ([place]) 2010, 2011   x2    Vertebral artery stenosis     Past Surgical History:  Procedure Laterality Date   ABDOMINAL HYSTERECTOMY     DILATION AND CURETTAGE OF UTERUS     FOOT SURGERY      There were no vitals filed for this visit.   Subjective Assessment - 03/12/21 1118     Subjective Pt states she is feeling okay today. She reports being able to complete her HEP and states that it is going well.    Patient is accompained by: Family member   family member in the lobby   Pertinent History CVA, Vertebral Artery Stenosis, HLD, HTN, Guillain - Barre, DM Type 2, Obesity, lymphoma    Limitations Standing;Walking;House hold activities    How long can you walk comfortably? 30 minutes    Diagnostic tests Pt found to have  acute/subacute infarcts to L pons and occlusion to L vertebral artery    Patient Stated Goals Improve the strength    Currently in Pain? No/denies                               [facility] Adult PT Treatment/Exercise - 03/12/21 1028       Transfers   Transfers Sit to Stand;Stand to Sit    Sit to Stand 5: Supervision    Stand to Sit 5: Supervision      Ambulation/Gait   Ambulation/Gait Yes    Ambulation/Gait Assistance 5: Supervision    Ambulation Distance (Feet) 230 Feet    Assistive device Straight cane    Gait Pattern Step-through pattern;Decreased step length - left;Decreased stance time - left;Decreased stance time - right;Decreased dorsiflexion - left    Ambulation Surface Level;Indoor      Neuro Re-ed    Neuro Re-ed Details  Standing at stairs with feet together on blue foam. 2x10 BLE each of stair taps w/o UE support. Pt demonstrated more difficulty when having to stand on LLE and lost balance once but caught herself with UE. PT provided min guard w/ verbal cues for tall posture. Pt performed standing on airex pad with  EC, with a small base of support 2x30". Pt demonstrated increased sway and had to use UE to catch herself several times. PT provided min guard. Pt performed 3 laps of hurdles at the counter to facilitate reciprocal gait. Pt demonstrated more difficulty with stepping w/ the RLE as she has to support all her weight on the LLE. Pt did not require UE assit but PT provided min guard. Pt performed 3 laps of sidesteping over hurdles. Pt required UE assist at counter. PT provided min guard nad cues to take bigger steps to allow both feet to clear.      Exercises   Other Exercises  At stairs pt peformed x10 stair taps w/o UE support BLE. PT provided verbal cues to maintain upright posture and provided min guard. Pt peformed step ups BLE x10 each at stairs. Pt states that her LLE felt heavy after these.      Knee/Hip Exercises: Standing   Other Standing Knee  Exercises Pt pefromed heel-toe raises to facilitate dorsiflexion in LLE. Pt demonstrated some difficulty in dorsiflexion in LLE and required UE assist at counter to maintain balance. Pt performed 3x10. PT provided min guard.                       PT Short Term Goals - 02/22/21 1257       PT SHORT TERM GOAL #1   Title Patient will be independent with initial HEP for strength/balance (ALL STGs Due: 03/26/21)    Baseline no HEP established    Time 4    Period Weeks    Status New    Target Date 03/26/21      PT SHORT TERM GOAL #2   Title Patient will be able to ambulate >/= 500 ft on indoor surfaces w/ LRAD vs. no AD at Mod I level    Baseline supervision/CGA, 115 ft    Time 4    Period Weeks    Status New      PT SHORT TERM GOAL #3   Title Patient will improve gait speed to >/= 2.5 ft/sec    Baseline 2.21 ft/sec    Time 4    Period Weeks    Status New      PT SHORT TERM GOAL #4   Title Patient will improve Berg Balance to >/= 45/56 to demo reduced fall risk and improved balance.    Baseline 40/56    Time 4    Period Weeks    Status New               PT Long Term Goals - 02/22/21 1300       PT LONG TERM GOAL #1   Title Patient will be independent with final HEP for strength/balance and report completion >/= 5x/week for compliance upon d/c (ALL LTGs Due: 04/23/21)    Baseline no HEP established    Time 8    Period Weeks    Status New    Target Date 04/23/21      PT LONG TERM GOAL #2   Title Patient will improve gait speed to >/= 3.0 ft/sec to demo improved community mobility    Baseline 2.21 ft/sec    Time 8    Period Weeks    Status New      PT LONG TERM GOAL #3   Title Patient will improve 5x sit <> stand to </= 12 seconds to demo improved balance and reduced fall risk  Baseline 17.28 secs    Time 8    Period Weeks    Status New      PT LONG TERM GOAL #4   Title Patient will improve Berg Balance to >/= 50/56 to demo reduced fall risk  and improved balance    Baseline 40/56    Time 8    Period Weeks    Status New      PT LONG TERM GOAL #5   Title Patient will be able to ambulate to >/= 1000 ft outdoors on unlevel surfaces with Mod I and LRAD    Baseline TBA    Time 8    Period Weeks    Status New                   Plan - 03/12/21 1121     Clinical Impression Statement Pt tolerated therapy well with no adverse s/s and appropriate fatigue at the conclusion of session. Today's session focused on BLE strengthening activities as well as improving pt's balance. Pt demonstrated adequate balance on level surface but demonstrated difficulty maintaining balance on complaint surfaces especially w/ eyes closed or when relying solely on LLE. PT will continue to progress pt as tolerated w/ activities to continue to faciliate improvements in BLE strength and balance to achieve pt's LTG.    Personal Factors and Comorbidities Comorbidity 3+    Comorbidities CVA, Vertebral Artery Stenosis, HLD, HTN, Guillain - Barre, DM Type 2, Obesity, lymphoma    Examination-Activity Limitations Stairs;Stand;Locomotion Level;Bend;Squat    Examination-Participation Restrictions Occupation;Cleaning;Driving;Community Activity    Stability/Clinical Decision Making Evolving/Moderate complexity    Rehab Potential Good    PT Frequency 2x / week    PT Duration 8 weeks    PT Treatment/Interventions ADLs/Self Care Home Management;Aquatic Therapy;Canalith Repostioning;Biofeedback;Moist Heat;Electrical Stimulation;Cryotherapy;DME Instruction;Gait training;Stair training;Functional mobility training;Therapeutic activities;Therapeutic exercise;Balance training;Neuromuscular re-education;Patient/family education;Orthotic Fit/Training;Manual techniques;Passive range of motion;Vestibular    PT Next Visit Plan Continue with balance training on compliant surfaces, dynamic gait activities. Gait on varied surfaces with cane. scifit/nuste for endurance training.     Consulted and Agree with Plan of Care Patient             Patient will benefit from skilled therapeutic intervention in order to improve the following deficits and impairments:  Abnormal gait, Decreased balance, Decreased endurance, Difficulty walking, Dizziness, Impaired sensation, Decreased range of motion, Decreased activity tolerance, Decreased coordination, Decreased knowledge of use of DME, Decreased strength  Visit Diagnosis: Other abnormalities of gait and mobility  Muscle weakness (generalized)  Unsteadiness on feet  Difficulty in walking, not elsewhere classified  Dizziness and giddiness     Problem List Patient Active Problem List   Diagnosis Date Noted   Atherosclerosis of aorta ([place]) 02/14/2021   Cerebral thrombosis with cerebral infarction 02/08/2021   Abnormal MRI of head 02/07/2021   Vertigo 02/07/2021   Class 1 obesity due to excess calories with body mass index (BMI) of 31.0 to 31.9 in adult 02/07/2021   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   Trigger finger, left ring finger 10/04/2018   Alternating constipation and diarrhea 04/20/2018   Diabetic peripheral neuropathy associated with type 2 diabetes mellitus ([place]) 02/26/2018   DM (diabetes mellitus), type 2 with peripheral vascular complications ([place]) 76/19/5093   ABNORMALITY OF GAIT 05/10/2010   ECZEMA, ATOPIC 04/03/2009   Hyperlipidemia associated with type 2 diabetes mellitus ([place]) 03/06/2009   BACK PAIN 11/14/2008   Cerebrovascular disease 08/05/2008   DIPLOPIA 07/15/2008   Essential hypertension 07/15/2008  [address], Student-PT 03/12/2021, 11:27 AM  [facility] [address] [place], [place], [address] Phone: [phone]   Fax:  [phone]  Name: [name] MRN: [id number] Date of Birth: 03-02-1961

## 2021-03-14 NOTE — Progress Notes (Signed)
HEMATOLOGY/ONCOLOGY CLINIC NOTE  Date of Service: .[name]  Patient Care Team: [name], [name], MD as PCP - General (Family Medicine)  CHIEF COMPLAINTS/PURPOSE OF CONSULTATION:  F/u for CLL/SLL  HISTORY OF PRESENTING ILLNESS:   [name] is a wonderful 60 y.o. 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 The pt reports that she is doing well overall.   The pt reports that her axillary lymphadenopathy was picked up during a routing Mammogram on 05/31/2019. Pt denies feeling any lumps or bumps in either axillary regions. Pt has felt the same for the last 6-12 months.   She has HTN and Diabetes. Her HTN is currently well-controlled but her Diabetes has been harder to control. Pt notes that "sometimes it's up and sometimes it's down". 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 She feels that the reaction on her left side is a little slower than her right side, but nothing that's noticeable. She denies any weakness, vision changes, or other residual symptoms from either stroke. 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 Pt made a full recovery and has no remaining symptoms. Her eczema is seasonal and not very bothersome. She denies any concerns for frequent infections. Pt has neuropathy in her hands/fingers and has been prescribed Gabapentin, which is helping.   Pt does not drink much alcohol and has never been a smoker. Her father passed from [place] and had a history of smoking. Her mother passed from a stroke last spring. 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 She is living alone and has one sister in the area.   Pt has had her first dose of the COVID19 vaccine and tolerated it well. She has the second dose scheduled.   Of note prior to the patient's visit today, pt has had Flow Pathology ([id number])  completed on 06/26/2019 with results revealing "Monoclonal B-cell population identified."  Pt has had Right Axilla LN Bx ([id number]) completed on 06/26/2019 with results revealing "NON-HODGKIN B-CELL LYMPHOMA"   Pt has also had Mammogram ([id number]) completed on 05/31/2019 with results revealing "Further evaluation is suggested for prominent lymph nodes in the bilateral axilla."  Most recent lab results (03/13/2019) of CBC and CMP is as follows: all values are WNL except for RBC at 5.61, MCV at 78.8, MCH at 25.8, Sodium at 131, Glucose at 502, BUN at 24.  On review of systems, pt reports hard stools, tingling/numbness in fingers/hands and denies weakness, vision changes, fevers, chills, night sweats, SOB, fatigue, constipation, diarrhea and any other symptoms.   On PMHx the pt reports Guillain-Barr syndrome, Type II Diabetes, Atopic Eczema, HLD, HTN, Stroke x2, Neuropathy. On Social Hx the pt reports that she does not drink much alcohol and is a non-smoker.  INTERVAL HISTORY:   [name] is a wonderful 60 y.o. 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 The patient's last visit with [place] was on 09/04/2020.   Since her last visit the patient did have a CVA MRI of the brain on 02/07/2021 showed Two acute/early subacute infarcts within the left pons measuring up to 8 mm. Small chronic lacunar infarct within the left cerebellar hemisphere. .  She continues following with cardiology and neurology for management of CVA and risk factors.  No fevers no chills no night sweats no sudden unexpected  weight loss.  No new significant fatigue.  Lab results today 03/08/2021 of CBC w/diff mild microcytic anemia hemoglobin of 11.9 with an MCV of 78.6.  Patient has chronic microcytosis with some relative polycythemia suggestive of possible thalassemia trait.  Normal WBC count of 5.1k normal platelets of 263k no absolute lymphocytosis.  On review of systems, pt reports  chronic hot flashes, stress and denies fevers, chills, night sweats, new lumps/bumps, leg swelling, and any other symptoms. CMP shows hyperglycemia of 268 BUN 30 creatinine 1.15. LDH within normal limits at 188   MEDICAL HISTORY:  Past Medical History:  Diagnosis Date   Abnormality of gait 05/10/2010   BACK PAIN 11/14/2008   Class 1 obesity due to excess calories with body mass index (BMI) of 31.0 to 31.9 in adult 02/07/2021   DIABETES MELLITUS, TYPE II 07/15/2008   Diplopia 07/15/2008   ECZEMA, ATOPIC 04/03/2009   Guillain-Barre ([place]) 1988   HYPERLIPIDEMIA 03/06/2009   HYPERTENSION 07/15/2008   Stroke ([place]) 2010, 2011   x2    Vertebral artery stenosis     SURGICAL HISTORY: Past Surgical History:  Procedure Laterality Date   ABDOMINAL HYSTERECTOMY     DILATION AND CURETTAGE OF UTERUS     FOOT SURGERY      SOCIAL HISTORY: Social History   Socioeconomic History   Marital status: Single    Spouse name: Not on file   Number of children: Not on file   Years of education: Not on file   Highest education level: Not on file  Occupational History   Occupation: custodian at [employer]	[place]  Tobacco Use   Smoking status: Never   Smokeless tobacco: Never  Vaping Use   Vaping Use: Never used  Substance and Sexual Activity   Alcohol use: No   Drug use: No   Sexual activity: Not on file  Other Topics Concern   Not on file  Social History Narrative   Not on file   Social Determinants of Health   Financial Resource Strain: Not on file  Food Insecurity: Not on file  Transportation Needs: Not on file  Physical Activity: Not on file  Stress: Not on file  Social Connections: Not on file  Intimate Partner Violence: Not on file    FAMILY HISTORY: Family History  Problem Relation Age of Onset   Diabetes Sister    Asthma Other    Stroke Other    Hypertension Other    Diabetes Mother    Stroke Mother    Cancer Father        pt states hae had some kind of stomach  cancer, ? stomach or colon   Breast cancer Neg Hx     ALLERGIES:  has No Known Allergies.  MEDICATIONS:  Current Outpatient Medications  Medication Sig Dispense Refill   acetaminophen (TYLENOL) 500 MG tablet Take 2 tablets (1,000 mg total) by mouth every 6 (six) hours as needed. 30 tablet 0   amLODipine (NORVASC) 10 MG tablet Take 1 tablet by mouth once daily (Patient taking differently: Take 10 mg by mouth daily.) 30 tablet 3   aspirin EC 81 MG tablet Take 1 tablet (81 mg total) by mouth daily. Swallow whole. 90 tablet 0   atorvastatin (LIPITOR) 80 MG tablet Take 1 tablet (80 mg total) by mouth daily. 30 tablet 0   Blood Pressure Monitoring (BLOOD PRESSURE MONITOR AUTOMAT) DEVI 1 Device by Does not apply route daily. 1 Device 0   clopidogrel (PLAVIX) 75 MG tablet Take  1 tablet (75 mg total) by mouth daily. 90 tablet 3   Continuous Blood Gluc Sensor (FREESTYLE LIBRE 2 SENSOR) MISC 1 Device by Does not apply route every 14 (fourteen) days. 6 each 3   FLUoxetine (PROZAC) 20 MG capsule Take 1 capsule (20 mg total) by mouth daily. 90 capsule 2   fluticasone (FLONASE) 50 MCG/ACT nasal spray Place 1 spray into both nostrils 2 (two) times daily. 16 g 3   glucose blood (CONTOUR NEXT TEST) test strip 1 each by Other route 2 (two) times daily. And lancets 2/day 200 each 3   glucose blood (ONETOUCH VERIO) test strip USE TO CHECK BLOOD SUGAR TWICE A DAY AND PRN 100 each 6   hydrALAZINE (APRESOLINE) 50 MG tablet Take 1 tablet (50 mg total) by mouth 3 (three) times daily. 90 tablet 1   insulin glargine (LANTUS) 100 UNIT/ML Solostar Pen Inject 160 Units into the skin every morning. And pen needles 1/day 165 mL 3   lisinopril-hydrochlorothiazide (ZESTORETIC) 20-12.5 MG tablet Take 2 tablets by mouth once daily (Patient taking differently: Take 2 tablets by mouth daily.) 60 tablet 3   meclizine (ANTIVERT) 12.5 MG tablet Take 1 tablet (12.5 mg total) by mouth 2 (two) times daily as needed for dizziness. 60  tablet 1   metoprolol succinate (TOPROL-XL) 50 MG 24 hr tablet TAKE 1 TABLET BY MOUTH ONCE DAILY WITH  OR  IMMEDIATELY  FOLLOWING  A  MEAL 30 tablet 0   omeprazole (PRILOSEC) 20 MG capsule Take 1 capsule by mouth once daily (Patient taking differently: Take 20 mg by mouth daily.) 90 capsule 1   ONE TOUCH LANCETS MISC USE TO CHECK BLOOD SUGAR TWICE A DAY AND PRN 100 each 6   topiramate (TOPAMAX) 25 MG tablet Take 1 tablet (25 mg total) by mouth 2 (two) times daily. 60 tablet 0   triamcinolone ointment (KENALOG) 0.5 % Apply topically 2 (two) times daily as needed. 30 g 1   No current facility-administered medications for this visit.    REVIEW OF SYSTEMS:   .10 Point review of Systems was done is negative except as noted above.   PHYSICAL EXAMINATION: ECOG PERFORMANCE STATUS: 0 - Asymptomatic  . Vitals:   03/08/21 1441  BP: (!) 145/84  Pulse: 80  Resp: 17  Temp: 98.3 F (36.8 C)  SpO2: 100%   Filed Weights   03/08/21 1441  Weight: 191 lb 6.4 oz (86.8 kg)   .Body mass index is 32.85 kg/m.     [name] GENERAL:alert, in no acute distress and comfortable SKIN: no acute rashes, no significant lesions EYES: conjunctiva are pink and non-injected, sclera anicteric OROPHARYNX: MMM, no exudates, no oropharyngeal erythema or ulceration NECK: supple, no JVD LYMPH: There is palpable lymphadenopathy in the neck and axillary regions. LUNGS: clear to auscultation b/l with normal respiratory effort HEART: regular rate & rhythm ABDOMEN:  normoactive bowel sounds , non tender, not distended. Extremity: no pedal edema PSYCH: alert & oriented x 3 with fluent speech NEURO: no focal motor/sensory deficits   LABORATORY DATA:  I have reviewed the data as listed  . CBC Latest Ref Rng & Units 03/08/2021 02/06/2021 12/26/2020  WBC 4.0 - 10.5 K/uL 5.1 6.1 5.2  Hemoglobin 12.0 - 15.0 g/dL 11.9(L) 13.0 13.5  Hematocrit 36.0 - 46.0 % 36.4 38.7 41.2  Platelets 150 - 400 K/uL 263 257 247    . CMP  Latest Ref Rng & Units 03/08/2021 02/07/2021 02/06/2021  Glucose 70 - 99 mg/dL 268(H) 164(H)  291(H)  BUN 8 - 23 mg/dL 30(H) 23 21  Creatinine 0.44 - 1.00 mg/dL 1.15(H) 1.11(H) 0.87  Sodium 135 - 145 mmol/L 138 135 133(L)  Potassium 3.5 - 5.1 mmol/L 4.0 3.8 4.1  Chloride 98 - 111 mmol/L 106 103 101  CO2 22 - 32 mmol/L 25 24 25   Calcium 8.9 - 10.3 mg/dL 9.7 9.9 9.9  Total Protein 6.5 - 8.1 g/dL 7.4 - -  Total Bilirubin 0.3 - 1.2 mg/dL 0.4 - -  Alkaline Phos 38 - 126 U/L 98 - -  AST 15 - 41 U/L 15 - -  ALT 0 - 44 U/L 20 - -   07/16/2019 FISH-CLL Prognostic Panel:         RADIOGRAPHIC STUDIES: I have personally reviewed the radiological images as listed and agreed with the findings in the report. No results found.  ASSESSMENT & PLAN:    60 yo with   1) Rai stage 1 CLL/SLL  Diagnosed incidentally as LNadenopathy noted on routine screening mamogram. No consitutional symptoms.  05/31/2019 Mammogram ([id number]) revealed "Further evaluation is suggested for prominent lymph nodes in the bilateral axilla." 06/26/2019 Flow Pathology ([id number]) revealed "Monoclonal B-cell population identified." 06/26/2019 Right Axilla LN Bx ([id number]) revealed "NON-HODGKIN B-CELL LYMPHOMA"  07/16/2019 FISH/CLL Prognostic Panel ([id number]) revealed "Trisomy 12 (+12) is present."  10/15/2019 CT Soft Tissue Neck ([id number]) revealed bilateral cervical lymphadenopathy. 10/15/2019 CT C/A/P ([id number]) ([id number]) revealed several small lymph nodes in axillary, retroperitoneal, and pelvic regions. Incidentally noted complex renal cyst.   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 Patient Active Problem List   Diagnosis Date Noted   Atherosclerosis of aorta ([place]) 02/14/2021   Cerebral thrombosis with cerebral infarction 02/08/2021   Abnormal MRI of head 02/07/2021   Vertigo 02/07/2021   Class 1 obesity due to  excess calories with body mass index (BMI) of 31.0 to 31.9 in adult 02/07/2021   Small lymphocytic lymphoma ([place]) 10/08/2019   Trigger finger, left ring finger 10/04/2018   Alternating constipation and diarrhea 04/20/2018   Diabetic peripheral neuropathy associated with type 2 diabetes mellitus ([place]) 02/26/2018   DM (diabetes mellitus), type 2 with peripheral vascular complications ([place]) 01/75/1025   ABNORMALITY OF GAIT 05/10/2010   ECZEMA, ATOPIC 04/03/2009   Hyperlipidemia associated with type 2 diabetes mellitus ([place]) 03/06/2009   BACK PAIN 11/14/2008   Cerebrovascular disease 08/05/2008   DIPLOPIA 07/15/2008   Essential hypertension 07/15/2008    PLAN: -Discussed pt labwork today, 03/08/2021; CBC w/diff mild microcytic anemia hemoglobin of 11.9 with an MCV of 78.6.  Patient has chronic microcytosis with some relative polycythemia suggestive of possible thalassemia trait.  Normal WBC count of 5.1k normal platelets of 263k no absolute lymphocytosis. CMP shows hyperglycemia of 268 BUN 30 creatinine 1.15. LDH within normal limits at 188 -No lab or clinical evidence of  Non-Hodgkin's Lymphoma progression necessitating treatment at this time. -Follow-up with primary care physician, cardiology and neurology for management of CVA. -Patient developing some anemia with her microcytosis cannot rule out some slow GI losses in the context of dual antiplatelet therapy.  We will need to monitor this with neurology and her primary care physician. -We should follow-up with labs at the next visit to check up on her anemia, iron labs and thalassemia testing -Will see back in 6 months with labs.   FOLLOW UP: RTC with Dr Irene Limbo with labs in 6 months   . The  total time spent in the appointment was 20 minutes and more than 50% was on counseling and direct patient cares.   All of the patient's questions were answered with apparent satisfaction. The patient knows to call the clinic with any problems,  questions or concerns.    Sullivan Lone MD [place] AAHIVMS [facility] Hematology/Oncology Physician [facility]

## 2021-03-17 ENCOUNTER — Ambulatory Visit: Payer: BC Managed Care – PPO

## 2021-03-17 ENCOUNTER — Other Ambulatory Visit: Payer: Self-pay

## 2021-03-17 VITALS — BP 136/82

## 2021-03-17 DIAGNOSIS — R42 Dizziness and giddiness: Secondary | ICD-10-CM

## 2021-03-17 DIAGNOSIS — R2689 Other abnormalities of gait and mobility: Secondary | ICD-10-CM

## 2021-03-17 DIAGNOSIS — M6281 Muscle weakness (generalized): Secondary | ICD-10-CM

## 2021-03-17 DIAGNOSIS — R262 Difficulty in walking, not elsewhere classified: Secondary | ICD-10-CM

## 2021-03-17 DIAGNOSIS — R2681 Unsteadiness on feet: Secondary | ICD-10-CM

## 2021-03-17 NOTE — Therapy (Signed)
[facility] [address] [place], [place], [address] Phone: [phone]   Fax:  [phone]  Physical Therapy Treatment  Patient Details  Name: [name] MRN: [id number] Date of Birth: Oct 01, 1960 Referring Provider (PT): Referred by Phillips Climes, MD. PCP is Betty [name], MD   Encounter Date: 03/17/2021   PT End of Session - 03/17/21 1101     Visit Number 7    Number of Visits 17    Date for PT Re-Evaluation 04/23/21    Authorization Type BCBS    PT Start Time 1102    PT Stop Time 1143    PT Time Calculation (min) 41 min    Equipment Utilized During Treatment Gait belt    Activity Tolerance Patient tolerated treatment well    Behavior During Therapy WFL for tasks assessed/performed             Past Medical History:  Diagnosis Date   Abnormality of gait 05/10/2010   BACK PAIN 11/14/2008   Class 1 obesity due to excess calories with body mass index (BMI) of 31.0 to 31.9 in adult 02/07/2021   DIABETES MELLITUS, TYPE II 07/15/2008   Diplopia 07/15/2008   ECZEMA, ATOPIC 04/03/2009   Guillain-Barre ([place]) 1988   HYPERLIPIDEMIA 03/06/2009   HYPERTENSION 07/15/2008   Stroke ([place]) 2010, 2011   x2    Vertebral artery stenosis     Past Surgical History:  Procedure Laterality Date   ABDOMINAL HYSTERECTOMY     DILATION AND CURETTAGE OF UTERUS     FOOT SURGERY      Vitals:   03/17/21 1106  BP: 136/82     Subjective Assessment - 03/17/21 1104     Subjective No new changes/complaints. Reports exercises are still going well. No pain.    Patient is accompained by: Family member   family member in the lobby   Pertinent History CVA, Vertebral Artery Stenosis, HLD, HTN, Guillain - Barre, DM Type 2, Obesity, lymphoma    Limitations Standing;Walking;House hold activities    How long can you walk comfortably? 30 minutes    Diagnostic tests Pt found to have acute/subacute infarcts to L pons and occlusion  to L vertebral artery    Patient Stated Goals Improve the strength    Currently in Pain? No/denies               [facility] Adult PT Treatment/Exercise - 03/17/21 0001       Transfers   Transfers Sit to Stand;Stand to Sit    Sit to Stand 5: Supervision    Stand to Sit 5: Supervision      Ambulation/Gait   Ambulation/Gait Yes    Ambulation/Gait Assistance 5: Supervision    Ambulation/Gait Assistance Details completed ambulation x 500 ft without AD working on equal step length and improved balance without UE support. patient doing well, supervision overall. 1-2 instances of CGA required.    Ambulation Distance (Feet) 500 Feet    Assistive device None    Gait Pattern Step-through pattern;Decreased step length - left;Decreased stance time - left;Decreased stance time - right;Decreased dorsiflexion - left    Ambulation Surface Level;Indoor      High Level Balance   High Level Balance Activities Negotiating over obstacles    High Level Balance Comments in hallway worked on negoitating over hurdles x 4 laps down and back, PT cueing to avoid slowing pace prior to stepping over obstacle. patient able to complete with supervision.      Exercises  Exercises Knee/Hip      Knee/Hip Exercises: Aerobic   Other Aerobic Completed SciFit with BUE/BLE on Level 3.0 x 6 minutes for improved endurance/activity tolerance. Cues for maintaining steps per minute > 50. patient tolerating well.                 Balance Exercises - 03/17/21 0001       Balance Exercises: Standing   SLS with Vectors Foam/compliant surface    SLS with Vectors Limitations standing across blue balance beam completd alternating toe taps to cones x 15 reps bilat, intermittent UE support required with completion progressed to no UE support.    Tandem Gait Foam/compliant surface;Intermittent upper extremity support;Limitations    Tandem Gait Limitations completed x 3 laps down and back in // bars with intermittent UE  suppor initially progressing to no UE support    Sidestepping Foam/compliant support;Limitations    Sidestepping Limitations completed side stepping down and back x 3 laps in // bars on blue balance beam, completed initially with UE support then progressed to no UE support. cues for step length.    Sit to Stand Foam/compliant surface;Without upper extremity support;Limitations    Sit to Stand Limitations completed 2 x 10 reps with BLE placed on airex, no UE support required.                  PT Short Term Goals - 02/22/21 1257       PT SHORT TERM GOAL #1   Title Patient will be independent with initial HEP for strength/balance (ALL STGs Due: 03/26/21)    Baseline no HEP established    Time 4    Period Weeks    Status New    Target Date 03/26/21      PT SHORT TERM GOAL #2   Title Patient will be able to ambulate >/= 500 ft on indoor surfaces w/ LRAD vs. no AD at Mod I level    Baseline supervision/CGA, 115 ft    Time 4    Period Weeks    Status New      PT SHORT TERM GOAL #3   Title Patient will improve gait speed to >/= 2.5 ft/sec    Baseline 2.21 ft/sec    Time 4    Period Weeks    Status New      PT SHORT TERM GOAL #4   Title Patient will improve Berg Balance to >/= 45/56 to demo reduced fall risk and improved balance.    Baseline 40/56    Time 4    Period Weeks    Status New               PT Long Term Goals - 02/22/21 1300       PT LONG TERM GOAL #1   Title Patient will be independent with final HEP for strength/balance and report completion >/= 5x/week for compliance upon d/c (ALL LTGs Due: 04/23/21)    Baseline no HEP established    Time 8    Period Weeks    Status New    Target Date 04/23/21      PT LONG TERM GOAL #2   Title Patient will improve gait speed to >/= 3.0 ft/sec to demo improved community mobility    Baseline 2.21 ft/sec    Time 8    Period Weeks    Status New      PT LONG TERM GOAL #3   Title Patient will improve 5x sit <>  stand to </= 12 seconds to demo improved balance and reduced fall risk    Baseline 17.28 secs    Time 8    Period Weeks    Status New      PT LONG TERM GOAL #4   Title Patient will improve Berg Balance to >/= 50/56 to demo reduced fall risk and improved balance    Baseline 40/56    Time 8    Period Weeks    Status New      PT LONG TERM GOAL #5   Title Patient will be able to ambulate to >/= 1000 ft outdoors on unlevel surfaces with Mod I and LRAD    Baseline TBA    Time 8    Period Weeks    Status New                   Plan - 03/17/21 1142     Clinical Impression Statement Continued to progress balance today working on more complaint surfaces, as well as reduced UE support with patient tolerating well. Also continued gait trianing without AD working on equal step length and negoitating over obstacles with patient doing well supervision mainly. Will continue to progress toward all LTGs.    Personal Factors and Comorbidities Comorbidity 3+    Comorbidities CVA, Vertebral Artery Stenosis, HLD, HTN, Guillain - Barre, DM Type 2, Obesity, lymphoma    Examination-Activity Limitations Stairs;Stand;Locomotion Level;Bend;Squat    Examination-Participation Restrictions Occupation;Cleaning;Driving;Community Activity    Stability/Clinical Decision Making Evolving/Moderate complexity    Rehab Potential Good    PT Frequency 2x / week    PT Duration 8 weeks    PT Treatment/Interventions ADLs/Self Care Home Management;Aquatic Therapy;Canalith Repostioning;Biofeedback;Moist Heat;Electrical Stimulation;Cryotherapy;DME Instruction;Gait training;Stair training;Functional mobility training;Therapeutic activities;Therapeutic exercise;Balance training;Neuromuscular re-education;Patient/family education;Orthotic Fit/Training;Manual techniques;Passive range of motion;Vestibular    PT Next Visit Plan Continue with balance training on compliant surfaces, dynamic gait activities. Gait on varied  surfaces with and without cane. Gait outdoors? scifit/nustep for endurance training.    Consulted and Agree with Plan of Care Patient             Patient will benefit from skilled therapeutic intervention in order to improve the following deficits and impairments:  Abnormal gait, Decreased balance, Decreased endurance, Difficulty walking, Dizziness, Impaired sensation, Decreased range of motion, Decreased activity tolerance, Decreased coordination, Decreased knowledge of use of DME, Decreased strength  Visit Diagnosis: Dizziness and giddiness  Other abnormalities of gait and mobility  Muscle weakness (generalized)  Difficulty in walking, not elsewhere classified  Unsteadiness on feet     Problem List Patient Active Problem List   Diagnosis Date Noted   Atherosclerosis of aorta ([place]) 02/14/2021   Cerebral thrombosis with cerebral infarction 02/08/2021   Abnormal MRI of head 02/07/2021   Vertigo 02/07/2021   Class 1 obesity due to excess calories with body mass index (BMI) of 31.0 to 31.9 in adult 02/07/2021   Small lymphocytic lymphoma ([place]) 10/08/2019   Trigger finger, left ring finger 10/04/2018   Alternating constipation and diarrhea 04/20/2018   Diabetic peripheral neuropathy associated with type 2 diabetes mellitus ([place]) 02/26/2018   DM (diabetes mellitus), type 2 with peripheral vascular complications ([place]) 29/47/6546   ABNORMALITY OF GAIT 05/10/2010   ECZEMA, ATOPIC 04/03/2009   Hyperlipidemia associated with type 2 diabetes mellitus ([place]) 03/06/2009   BACK PAIN 11/14/2008   Cerebrovascular disease 08/05/2008   DIPLOPIA 07/15/2008   Essential hypertension 07/15/2008    Jones Bales, PT, DPT 03/17/2021, 11:51 AM  [facility] [address] [place], [place], [address] Phone: [phone]   Fax:  [phone]  Name: [name] MRN: [id number] Date of Birth: [birth year]

## 2021-03-19 ENCOUNTER — Other Ambulatory Visit: Payer: Self-pay

## 2021-03-19 ENCOUNTER — Ambulatory Visit: Payer: BC Managed Care – PPO

## 2021-03-19 DIAGNOSIS — M6281 Muscle weakness (generalized): Secondary | ICD-10-CM

## 2021-03-19 DIAGNOSIS — R2681 Unsteadiness on feet: Secondary | ICD-10-CM

## 2021-03-19 DIAGNOSIS — R262 Difficulty in walking, not elsewhere classified: Secondary | ICD-10-CM

## 2021-03-19 DIAGNOSIS — R2689 Other abnormalities of gait and mobility: Secondary | ICD-10-CM | POA: Diagnosis not present

## 2021-03-19 NOTE — Therapy (Signed)
[facility] [address] [place], [place], [address] Phone: [phone]   Fax:  [phone]  Physical Therapy Treatment  Patient Details  Name: [name] MRN: [id number] Date of Birth: 04-09-1961 Referring Provider (PT): Referred by Phillips Climes, MD. PCP is Betty [name], MD   Encounter Date: 03/19/2021   PT End of Session - 03/19/21 1114     Visit Number 8    Number of Visits 17    Date for PT Re-Evaluation 04/23/21    Authorization Type BCBS    PT Start Time 1016    PT Stop Time 1100    PT Time Calculation (min) 44 min    Equipment Utilized During Treatment Gait belt    Activity Tolerance Patient tolerated treatment well    Behavior During Therapy [facility] for tasks assessed/performed             Past Medical History:  Diagnosis Date   Abnormality of gait 05/10/2010   BACK PAIN 11/14/2008   Class 1 obesity due to excess calories with body mass index (BMI) of 31.0 to 31.9 in adult 02/07/2021   DIABETES MELLITUS, TYPE II 07/15/2008   Diplopia 07/15/2008   ECZEMA, ATOPIC 04/03/2009   Guillain-Barre ([place]) 1988   HYPERLIPIDEMIA 03/06/2009   HYPERTENSION 07/15/2008   Stroke ([place]) 2010, 2011   x2    Vertebral artery stenosis     Past Surgical History:  Procedure Laterality Date   ABDOMINAL HYSTERECTOMY     DILATION AND CURETTAGE OF UTERUS     FOOT SURGERY      There were no vitals filed for this visit.   Subjective Assessment - 03/19/21 1017     Subjective No new changes or complaints. Pt states she is ready for her workout today.    Patient is accompained by: Family member   family member in the lobby   Pertinent History CVA, Vertebral Artery Stenosis, HLD, HTN, Guillain - Barre, DM Type 2, Obesity, lymphoma    Limitations Standing;Walking;House hold activities    How long can you walk comfortably? 30 minutes    Diagnostic tests Pt found to have acute/subacute infarcts to L pons and occlusion  to L vertebral artery    Patient Stated Goals Improve the strength    Currently in Pain? No/denies                               [facility] Adult PT Treatment/Exercise - 03/19/21 1019       Transfers   Transfers Sit to Stand;Stand to Sit    Sit to Stand 5: Supervision    Stand to Sit 5: Supervision      Ambulation/Gait   Ambulation/Gait Yes    Ambulation/Gait Assistance 5: Supervision    Ambulation Distance (Feet) 460 Feet    Assistive device None    Gait Pattern Step-through pattern;Decreased step length - left;Decreased stance time - left;Decreased stance time - right;Decreased dorsiflexion - left    Ambulation Surface Level;Indoor    Gait velocity 11.51 seconds= 2.85 ft/sec      High Level Balance   High Level Balance Comments In // bars pt performed 3 laps of reciprocal gait over 4 hurdles. Pt required occasional UE assist to catch balance. PT provided min guad and verbal cues to maintain upright posture. In // pt performed 3 laps of side steps over 4 hurdles. Pt only required occassional UE assist and PT provided min  guard. PT provided verbal cues to keep toes pointed forward. In // pt performed standing on rocker board in A&P orientation for 2x30" EO/EC each. Pt demonstrated increased sway w/ EC and used UE assist to catch herself twice. PT provided min guard. Pt performed 3x8 of rockerboard taps in //. Pt performed 2 sets w/o UE and on last set pt used UE assist to facilitate bigger weight shift on rocker board. PT provided min guard and verbal cues for increased weight shift. In // pt performed 3x8 of cone taps while standing on blue balance beam. Pt did not require UE assist but demonstrated incresaed hesitation and difficulty when having to stand on LLE. PT  provided min guard and verbal cues for technique.      Knee/Hip Exercises: Aerobic   Other Aerobic Completed SciFit with BUE/BLE on Level 3.0 x 8 minutes for improved endurance/activity tolerance. Cues for  maintaining steps per minute > 50. patient tolerating well.                       PT Short Term Goals - 03/19/21 1114       PT SHORT TERM GOAL #1   Title Patient will be independent with initial HEP for strength/balance (ALL STGs Due: 03/26/21)    Baseline no HEP established    Time 4    Period Weeks    Status New    Target Date 03/26/21      PT SHORT TERM GOAL #2   Title Patient will be able to ambulate >/= 500 ft on indoor surfaces w/ LRAD vs. no AD at Mod I level    Baseline supervision/CGA, 115 ft    Time 4    Period Weeks    Status New      PT SHORT TERM GOAL #3   Title Patient will improve gait speed to >/= 2.5 ft/sec    Baseline 2.21 ft/sec, 11/18 2.85 ft/sec no AD    Time 4    Period Weeks    Status Achieved      PT SHORT TERM GOAL #4   Title Patient will improve Berg Balance to >/= 45/56 to demo reduced fall risk and improved balance.    Baseline 40/56    Time 4    Period Weeks    Status New               PT Long Term Goals - 02/22/21 1300       PT LONG TERM GOAL #1   Title Patient will be independent with final HEP for strength/balance and report completion >/= 5x/week for compliance upon d/c (ALL LTGs Due: 04/23/21)    Baseline no HEP established    Time 8    Period Weeks    Status New    Target Date 04/23/21      PT LONG TERM GOAL #2   Title Patient will improve gait speed to >/= 3.0 ft/sec to demo improved community mobility    Baseline 2.21 ft/sec    Time 8    Period Weeks    Status New      PT LONG TERM GOAL #3   Title Patient will improve 5x sit <> stand to </= 12 seconds to demo improved balance and reduced fall risk    Baseline 17.28 secs    Time 8    Period Weeks    Status New      PT LONG TERM GOAL #4   Title  Patient will improve Berg Balance to >/= 50/56 to demo reduced fall risk and improved balance    Baseline 40/56    Time 8    Period Weeks    Status New      PT LONG TERM GOAL #5   Title Patient will be  able to ambulate to >/= 1000 ft outdoors on unlevel surfaces with Mod I and LRAD    Baseline TBA    Time 8    Period Weeks    Status New                   Plan - 03/19/21 1116     Clinical Impression Statement Today's therapy session focused on continued improvements in activity tolerance and balance. Pt continued to demonstrate decrease use of UE support and decrease use of SPC for ambulation. Pt improved gait speed to 2.85 ft/sec for improved community ambulation. Pt still demonstrates deficits in balance especialy w/ eyes closed and when having to shift weight to LLE. Pt tolerated therapy session well with appropriate fatigue/soreness and no adverse s/s at the conclusion of session. PT will continue to progress pt with activities towards achieving LTG.    Personal Factors and Comorbidities Comorbidity 3+    Comorbidities CVA, Vertebral Artery Stenosis, HLD, HTN, Guillain - Barre, DM Type 2, Obesity, lymphoma    Examination-Activity Limitations Stairs;Stand;Locomotion Level;Bend;Squat    Examination-Participation Restrictions Occupation;Cleaning;Driving;Community Activity    Stability/Clinical Decision Making Evolving/Moderate complexity    Rehab Potential Good    PT Frequency 2x / week    PT Duration 8 weeks    PT Treatment/Interventions ADLs/Self Care Home Management;Aquatic Therapy;Canalith Repostioning;Biofeedback;Moist Heat;Electrical Stimulation;Cryotherapy;DME Instruction;Gait training;Stair training;Functional mobility training;Therapeutic activities;Therapeutic exercise;Balance training;Neuromuscular re-education;Patient/family education;Orthotic Fit/Training;Manual techniques;Passive range of motion;Vestibular    PT Next Visit Plan STG due next visit. Continue with balance training on compliant surfaces, dynamic gait activities. Gait on varied surfaces with and without cane. Gait outdoors? scifit/nustep for endurance training.    Consulted and Agree with Plan of Care Patient              Patient will benefit from skilled therapeutic intervention in order to improve the following deficits and impairments:  Abnormal gait, Decreased balance, Decreased endurance, Difficulty walking, Dizziness, Impaired sensation, Decreased range of motion, Decreased activity tolerance, Decreased coordination, Decreased knowledge of use of DME, Decreased strength  Visit Diagnosis: Muscle weakness (generalized)  Unsteadiness on feet  Difficulty in walking, not elsewhere classified  Other abnormalities of gait and mobility     Problem List Patient Active Problem List   Diagnosis Date Noted   Atherosclerosis of aorta ([place]) 02/14/2021   Cerebral thrombosis with cerebral infarction 02/08/2021   Abnormal MRI of head 02/07/2021   Vertigo 02/07/2021   Class 1 obesity due to excess calories with body mass index (BMI) of 31.0 to 31.9 in adult 02/07/2021   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   Trigger finger, left ring finger 10/04/2018   Alternating constipation and diarrhea 04/20/2018   Diabetic peripheral neuropathy associated with type 2 diabetes mellitus ([place]) 02/26/2018   DM (diabetes mellitus), type 2 with peripheral vascular complications ([place]) 81/44/8185   ABNORMALITY OF GAIT 05/10/2010   ECZEMA, ATOPIC 04/03/2009   Hyperlipidemia associated with type 2 diabetes mellitus ([place]) 03/06/2009   BACK PAIN 11/14/2008   Cerebrovascular disease 08/05/2008   DIPLOPIA 07/15/2008   Essential hypertension 07/15/2008    Lottie Mussel, Student-PT 03/19/2021, 11:48 AM  [facility]  [place], [place], [address] Phone: [phone]   Fax:  [phone]  Name: [name] MRN: [id number] Date of Birth: [birth year]

## 2021-03-22 ENCOUNTER — Ambulatory Visit (HOSPITAL_COMMUNITY)
Admission: RE | Admit: 2021-03-22 | Discharge: 2021-03-22 | Disposition: A | Discharge status: Home / Self Care | Payer: BC Managed Care – PPO | Source: Ambulatory Visit | Attending: Cardiology | Admitting: Cardiology

## 2021-03-22 ENCOUNTER — Other Ambulatory Visit: Payer: Self-pay

## 2021-03-22 ENCOUNTER — Ambulatory Visit: Payer: BC Managed Care – PPO

## 2021-03-22 DIAGNOSIS — M79605 Pain in left leg: Secondary | ICD-10-CM | POA: Insufficient documentation

## 2021-03-22 DIAGNOSIS — R2681 Unsteadiness on feet: Secondary | ICD-10-CM

## 2021-03-22 DIAGNOSIS — M79604 Pain in right leg: Secondary | ICD-10-CM

## 2021-03-22 DIAGNOSIS — R262 Difficulty in walking, not elsewhere classified: Secondary | ICD-10-CM

## 2021-03-22 DIAGNOSIS — R2689 Other abnormalities of gait and mobility: Secondary | ICD-10-CM

## 2021-03-22 DIAGNOSIS — M6281 Muscle weakness (generalized): Secondary | ICD-10-CM

## 2021-03-22 NOTE — Therapy (Signed)
[facility] [address] [place], [place], [address] Phone: [phone]   Fax:  [phone]  Physical Therapy Treatment  Patient Details  Name: [name] MRN: [id number] Date of Birth: [birth year] Referring Provider (PT): Referred by Phillips Climes, MD. PCP is Betty [name], MD   Encounter Date: 03/22/2021   PT End of Session - 03/22/21 1219     Visit Number 9    Number of Visits 17    Date for PT Re-Evaluation 04/23/21    Authorization Type BCBS    PT Start Time 1107    PT Stop Time 7035    PT Time Calculation (min) 38 min    Equipment Utilized During Treatment Gait belt    Activity Tolerance Patient tolerated treatment well    Behavior During Therapy [facility] for tasks assessed/performed             Past Medical History:  Diagnosis Date   Abnormality of gait 05/10/2010   BACK PAIN 11/14/2008   Class 1 obesity due to excess calories with body mass index (BMI) of 31.0 to 31.9 in adult 02/07/2021   DIABETES MELLITUS, TYPE II 07/15/2008   Diplopia 07/15/2008   ECZEMA, ATOPIC 04/03/2009   Guillain-Barre ([place]) 1988   HYPERLIPIDEMIA 03/06/2009   HYPERTENSION 07/15/2008   Stroke ([place]) 2010, 2011   x2    Vertebral artery stenosis     Past Surgical History:  Procedure Laterality Date   ABDOMINAL HYSTERECTOMY     DILATION AND CURETTAGE OF UTERUS     FOOT SURGERY      There were no vitals filed for this visit.   Subjective Assessment - 03/22/21 1107     Subjective Pt states her right knee feels like it wants to buckle today but other than that she feels the same.    Patient is accompained by: Family member   family member in the lobby   Pertinent History CVA, Vertebral Artery Stenosis, HLD, HTN, Guillain - Barre, DM Type 2, Obesity, lymphoma    Limitations Standing;Walking;House hold activities    How long can you walk comfortably? 30 minutes    Diagnostic tests Pt found to have acute/subacute  infarcts to L pons and occlusion to L vertebral artery    Patient Stated Goals Improve the strength    Currently in Pain? No/denies                [facility] PT Assessment - 03/22/21 1118       Standardized Balance Assessment   Standardized Balance Assessment Berg Balance Test      Berg Balance Test   Sit to Stand Able to stand  independently using hands    Standing Unsupported Able to stand safely 2 minutes    Sitting with Back Unsupported but Feet Supported on Floor or Stool Able to sit safely and securely 2 minutes    Stand to Sit Sits safely with minimal use of hands    Transfers Able to transfer safely, minor use of hands    Standing Unsupported with Eyes Closed Able to stand 10 seconds safely    Standing Unsupported with Feet Together Able to place feet together independently and stand 1 minute safely    From Standing, Reach Forward with Outstretched Arm Can reach confidently >25 cm (10")    From Standing Position, Pick up Object from Floor Able to pick up shoe, needs supervision    From Standing Position, Turn to Look Behind Over each Shoulder Looks behind  from both sides and weight shifts well    Turn 360 Degrees Able to turn 360 degrees safely in 4 seconds or less    Standing Unsupported, Alternately Place Feet on Step/Stool Able to stand independently and safely and complete 8 steps in 20 seconds    Standing Unsupported, One Foot in Front Able to take small step independently and hold 30 seconds    Standing on One Leg Tries to lift leg/unable to hold 3 seconds but remains standing independently    Total Score 49                           [facility] Adult PT Treatment/Exercise - 03/22/21 1118       Transfers   Transfers Sit to Stand;Stand to Sit    Sit to Stand 5: Supervision    Stand to Sit 5: Supervision      Ambulation/Gait   Ambulation/Gait Yes    Ambulation/Gait Assistance 5: Supervision    Ambulation Distance (Feet) 575 Feet    Assistive device  Straight cane    Gait Pattern Step-through pattern;Decreased step length - left;Decreased stance time - left;Decreased stance time - right;Decreased dorsiflexion - left    Ambulation Surface Level;Indoor      Standardized Balance Assessment   Standardized Balance Assessment Berg Balance Test      Neuro Re-ed    Neuro Re-ed Details  Standing at counter on 2 pillows pt perfomed rhomberg w/ EO/EC w/ chair in front 2x30 sec each. Pt demonstrated increased sway and use of UE assist w/. EC. PT provided min guard.      Exercises   Other Exercises  At counter pt performed 1 lap of side steps w/o UE assist. Pt stated exercise was getting easy, PT plans to progress to side stepping w/ band next sessoin. Pt performed 2 laps of marches at counter. PT provided verbal cues and told pt to use single UE assist to really focus on bringing knees up. Pt was not able to bring knees up w/o UE support. Pt performed 1x30" standing calf stretch at counter. PT had to cue pt to keep heel down and to hold stretch for 30 seconds. In sitting pt performed seated hamstring stretch 1x30" each BLE. PT cued pt to maintain a straight knee and to bend at the hips to get feel stretch.                       PT Short Term Goals - 03/22/21 1243       PT SHORT TERM GOAL #1   Title Patient will be independent with initial HEP for strength/balance (ALL STGs Due: 03/26/21)    Baseline no HEP established 11/25 PT educated pt on performing HEP at counter instead of corner since pt states she is not able perform exercises due to cluttered corners    Time 4    Period Weeks    Status On-going    Target Date 03/26/21      PT SHORT TERM GOAL #2   Title Patient will be able to ambulate >/= 500 ft on indoor surfaces w/ LRAD vs. no AD at Mod I level    Baseline supervision/CGA, 115 ft 11/21 525ft w/ SPC    Time 4    Period Weeks    Status Achieved      PT SHORT TERM GOAL #3   Title Patient will improve gait speed to >/=  2.5  ft/sec    Baseline 2.21 ft/sec, 11/18 2.85 ft/sec no AD    Time 4    Period Weeks    Status Achieved      PT SHORT TERM GOAL #4   Title Patient will improve Berg Balance to >/= 45/56 to demo reduced fall risk and improved balance.    Baseline 40/56 11/21 49/56     Time 4    Period Weeks    Status Achieved               PT Long Term Goals - 02/22/21 1300       PT LONG TERM GOAL #1   Title Patient will be independent with final HEP for strength/balance and report completion >/= 5x/week for compliance upon d/c (ALL LTGs Due: 04/23/21)    Baseline no HEP established    Time 8    Period Weeks    Status New    Target Date 04/23/21      PT LONG TERM GOAL #2   Title Patient will improve gait speed to >/= 3.0 ft/sec to demo improved community mobility    Baseline 2.21 ft/sec    Time 8    Period Weeks    Status New      PT LONG TERM GOAL #3   Title Patient will improve 5x sit <> stand to </= 12 seconds to demo improved balance and reduced fall risk    Baseline 17.28 secs    Time 8    Period Weeks    Status New      PT LONG TERM GOAL #4   Title Patient will improve Berg Balance to >/= 50/56 to demo reduced fall risk and improved balance    Baseline 40/56    Time 8    Period Weeks    Status New      PT LONG TERM GOAL #5   Title Patient will be able to ambulate to >/= 1000 ft outdoors on unlevel surfaces with Mod I and LRAD    Baseline TBA    Time 8    Period Weeks    Status New                   Plan - 03/22/21 1232     Clinical Impression Statement Today's skilled PT visit focused on checking pt's STG. Pt presents to the clinic w/ progress in gait speed, balance, and endurance. Pt is now able to ambulate at a speed of 2.85 ft/sec w/o AD which now denotes her as a limited community ambulator. Pt scored a 49/56 on the Berg Balance Scale demonstrating a lowering in her fall risk. Pt was able to ambulate 575 ft w/ [facility] today demonstrating improved community  ambulation and activity tolerance. Pt still presents w/ deficits in balance espeically with her eyes closed and w/ narrow base of support such as tandem stance or single leg stance. Pt also presents w/ deficits as she requires the use of a SPC cane to ambulate long distances and still presents w/ deficits in gait speed as she is not a full community ambulator yet. PT will continue to progress pt as tolerated w/ activities towards pt achievement of LTG.    Personal Factors and Comorbidities Comorbidity 3+    Comorbidities CVA, Vertebral Artery Stenosis, HLD, HTN, Guillain - Barre, DM Type 2, Obesity, lymphoma    Examination-Activity Limitations Stairs;Stand;Locomotion Level;Bend;Squat    Examination-Participation Restrictions Occupation;Cleaning;Driving;Community Activity    Stability/Clinical Decision Making Evolving/Moderate complexity  Rehab Potential Good    PT Frequency 2x / week    PT Duration 8 weeks    PT Treatment/Interventions ADLs/Self Care Home Management;Aquatic Therapy;Canalith Repostioning;Biofeedback;Moist Heat;Electrical Stimulation;Cryotherapy;DME Instruction;Gait training;Stair training;Functional mobility training;Therapeutic activities;Therapeutic exercise;Balance training;Neuromuscular re-education;Patient/family education;Orthotic Fit/Training;Manual techniques;Passive range of motion;Vestibular    PT Next Visit Plan Progress sidesteps w/ band next visit. Continue with balance training on compliant surfaces, dynamic gait activities. Gait on varied surfaces with and without cane. Gait outdoors? scifit/nustep for endurance training.    Consulted and Agree with Plan of Care Patient             Patient will benefit from skilled therapeutic intervention in order to improve the following deficits and impairments:  Abnormal gait, Decreased balance, Decreased endurance, Difficulty walking, Dizziness, Impaired sensation, Decreased range of motion, Decreased activity tolerance,  Decreased coordination, Decreased knowledge of use of DME, Decreased strength  Visit Diagnosis: Unsteadiness on feet  Muscle weakness (generalized)  Difficulty in walking, not elsewhere classified  Other abnormalities of gait and mobility     Problem List Patient Active Problem List   Diagnosis Date Noted   Atherosclerosis of aorta ([place]) 02/14/2021   Cerebral thrombosis with cerebral infarction 02/08/2021   Abnormal MRI of head 02/07/2021   Vertigo 02/07/2021   Class 1 obesity due to excess calories with body mass index (BMI) of 31.0 to 31.9 in adult 02/07/2021   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   Trigger finger, left ring finger 10/04/2018   Alternating constipation and diarrhea 04/20/2018   Diabetic peripheral neuropathy associated with type 2 diabetes mellitus ([place]) 02/26/2018   DM (diabetes mellitus), type 2 with peripheral vascular complications ([place]) 03/88/8280   ABNORMALITY OF GAIT 05/10/2010   ECZEMA, ATOPIC 04/03/2009   Hyperlipidemia associated with type 2 diabetes mellitus ([place]) 03/06/2009   BACK PAIN 11/14/2008   Cerebrovascular disease 08/05/2008   DIPLOPIA 07/15/2008   Essential hypertension 07/15/2008    Lottie Mussel, Student-PT 03/22/2021, 12:57 PM  [facility] [address] [place], [place], [address] Phone: [phone]   Fax:  [phone]  Name: Hyla Coard MRN: [id number] Date of Birth: 02/09/1961

## 2021-03-24 ENCOUNTER — Ambulatory Visit: Payer: BC Managed Care – PPO

## 2021-03-24 ENCOUNTER — Telehealth (HOSPITAL_COMMUNITY): Payer: Self-pay | Admitting: *Deleted

## 2021-03-24 ENCOUNTER — Other Ambulatory Visit: Payer: Self-pay

## 2021-03-24 ENCOUNTER — Encounter: Payer: Self-pay | Admitting: Neurology

## 2021-03-24 ENCOUNTER — Ambulatory Visit (INDEPENDENT_AMBULATORY_CARE_PROVIDER_SITE_OTHER): Payer: BC Managed Care – PPO | Admitting: Neurology

## 2021-03-24 VITALS — BP 171/75 | HR 69 | Ht 64.0 in | Wt 189.0 lb

## 2021-03-24 DIAGNOSIS — I639 Cerebral infarction, unspecified: Secondary | ICD-10-CM | POA: Diagnosis not present

## 2021-03-24 DIAGNOSIS — I679 Cerebrovascular disease, unspecified: Secondary | ICD-10-CM | POA: Diagnosis not present

## 2021-03-24 DIAGNOSIS — M6281 Muscle weakness (generalized): Secondary | ICD-10-CM

## 2021-03-24 DIAGNOSIS — R42 Dizziness and giddiness: Secondary | ICD-10-CM

## 2021-03-24 DIAGNOSIS — R2689 Other abnormalities of gait and mobility: Secondary | ICD-10-CM | POA: Diagnosis not present

## 2021-03-24 DIAGNOSIS — R2681 Unsteadiness on feet: Secondary | ICD-10-CM

## 2021-03-24 DIAGNOSIS — R262 Difficulty in walking, not elsewhere classified: Secondary | ICD-10-CM

## 2021-03-24 NOTE — Progress Notes (Signed)
[facility] Clinic Note - Initial Visit   Date: 03/24/21  [name] MRN: [id number] DOB: Sep 13, 1960   Dear Dr. [name]:  Thank you for your kind referral of [name] for consultation of stroke. Although her history is well known to you, please allow [place] to reiterate it for the purpose of our medical record. The patient was accompanied to the clinic by self.    History of Present Illness: [name] is a 60 y.o. 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   In early [place] began having sudden onset of dizziness and was initially seen by ENT who ordered out-patient MRI brain and showed possible subacute punctate infarct in the left pons.  Over the next three days, she beganhaving double vision and left arm and leg numbness/tingling which prompted her to go to [facility] ER. She was on plavix at home. Repeat MRI brain showed two early acute infracts in the left pons and small chronic cerebellar infarcts. MRA showed right V4 occlusion, near occlusion of the left V4, as well we progressive atherosclerosis disease involving the intracranial vessels with severe M1 and M2 stenosis bilaterally.   She was started on aspirin 81mg  and continued with plavix 75mg  daily. Her LDL was elevated at 169, so lipitor was increased to 80mg  daily.  HbA1c very elevated 13.9.  She was discharged home and going to out-patient PT. She continues to have dizzy spells which can occur suddenly and last 30 minutes. She has started to use a cane because of her associated imbalance. Her left side remains numbness, but is hopefully PT will help.    She works as a Sports coach, but unable to return to work due to duties requiring her to climb ladders, which she  is no longer advised to do. She is concerned about her future job outlook and being able to meet her financial obligations.  Out-side paper records, electronic medical record, and images have been reviewed where available and summarized as:  NCS/EMG of the upper extremities 06/14/2018: The electrophysiologic findings are consistent with a sensorimotor polyneuropathy, with axonal and demyelinating features.  Overall, these findings are moderate in degree electrically.  MRI head 02/07/2021: MRI brain: 1. Two acute/early subacute infarcts within the left pons measuring up to 8 mm. 2. Otherwise stable noncontrast MRI appearance of the brain as compared to 04/09/2020. 3. Mild chronic small vessel ischemic changes within the cerebral white matter. 4. Small chronic lacunar infarct within the left cerebellar hemisphere. 5. Incompletely imaged and incompletely assessed cervical lymphadenopathy.   MRA head: 1. New from the prior MRA head of 05/10/2010, the distal V4 right vertebral artery appears occluded. Also new, there is near-occlusive stenosis versus occlusion of the V4 left vertebral artery (just beyond the left PICA origin). Redemonstrated severe stenosis within the distal V4 left vertebral artery. The basilar artery is patent, but somewhat diminutive. 2. Progressive atherosclerotic disease elsewhere, as outlined and with findings most notably as follows. 3. Progressive severe stenosis within the P1 left PCA. Progressive moderate/severe stenosis within the left PCA at the P2/P3 junction. 4. New severe stenosis within the left posterior communicating artery. 5. Progressive moderate stenosis of the supraclinoid right ICA. 6. Progressive moderate stenosis of the proximal M1 right MCA. Progressive moderate/severe stenosis within the distal M1 right MCA. 7. Redemonstrated severe stenosis within an inferior division proximal M2  right MCA vessel. 8. New moderate/severe stenosis within a superior  division proximal M2 left MCA vessel. 9. Redemonstrated moderate/severe stenosis within an inferior division proximal M2 left MCA vessel. 10. Progressive moderate/severe stenosis within the proximal A1 right ACA. 11. 2 mm inferiorly projecting vascular protrusion arising from the cavernous left ICA, which may reflect atherosclerotic lobulation or a small aneurysm. This finding was not definitively present on the prior MRA.  Lab Results  Component Value Date   CHOL 223 (H) 02/08/2021   HDL 35 (L) 02/08/2021   LDLCALC 169 (H) 02/08/2021   LDLDIRECT 175.9 03/02/2012   TRIG 93 02/08/2021   CHOLHDL 6.4 02/08/2021    Lab Results  Component Value Date   HGBA1C 13.9 (A) 02/09/2021   Lab Results  Component Value Date   RPRXYVOP92 924 03/23/2018   Lab Results  Component Value Date   TSH 0.50 08/19/2020   Lab Results  Component Value Date   ESRSEDRATE 14 07/15/2008    Past Medical History:  Diagnosis Date   Abnormality of gait 05/10/2010   BACK PAIN 11/14/2008   Class 1 obesity due to excess calories with body mass index (BMI) of 31.0 to 31.9 in adult 02/07/2021   DIABETES MELLITUS, TYPE II 07/15/2008   Diplopia 07/15/2008   ECZEMA, ATOPIC 04/03/2009   Guillain-Barre ([place]) 1988   HYPERLIPIDEMIA 03/06/2009   HYPERTENSION 07/15/2008   Stroke ([place]) 2010, 2011   x2    Vertebral artery stenosis     Past Surgical History:  Procedure Laterality Date   ABDOMINAL HYSTERECTOMY     DILATION AND CURETTAGE OF UTERUS     FOOT SURGERY       Medications:  Outpatient Encounter Medications as of 03/24/2021  Medication Sig Note   acetaminophen (TYLENOL) 500 MG tablet Take 2 tablets (1,000 mg total) by mouth every 6 (six) hours as needed.    amLODipine (NORVASC) 10 MG tablet Take 1 tablet by mouth once daily (Patient taking differently: Take 10 mg by mouth daily.)    aspirin EC 81 MG tablet Take 1 tablet (81 mg total) by mouth daily. Swallow whole.    atorvastatin (LIPITOR) 80 MG  tablet Take 1 tablet (80 mg total) by mouth daily.    Blood Pressure Monitoring (BLOOD PRESSURE MONITOR AUTOMAT) DEVI 1 Device by Does not apply route daily.    clopidogrel (PLAVIX) 75 MG tablet Take 1 tablet (75 mg total) by mouth daily.    Continuous Blood Gluc Sensor (FREESTYLE LIBRE 2 SENSOR) MISC 1 Device by Does not apply route every 14 (fourteen) days.    FLUoxetine (PROZAC) 20 MG capsule Take 1 capsule (20 mg total) by mouth daily.    fluticasone (FLONASE) 50 MCG/ACT nasal spray Place 1 spray into both nostrils 2 (two) times daily.    glucose blood (CONTOUR NEXT TEST) test strip 1 each by Other route 2 (two) times daily. And lancets 2/day    glucose blood (ONETOUCH VERIO) test strip USE TO CHECK BLOOD SUGAR TWICE A DAY AND PRN    hydrALAZINE (APRESOLINE) 50 MG tablet Take 1 tablet (50 mg total) by mouth 3 (three) times daily.    insulin glargine (LANTUS) 100 UNIT/ML Solostar Pen Inject 160 Units into the skin every morning. And pen needles 1/day    lisinopril-hydrochlorothiazide (ZESTORETIC) 20-12.5 MG tablet Take 2 tablets by mouth once daily (Patient taking differently: Take 2 tablets by mouth daily.)    meclizine (ANTIVERT) 12.5 MG tablet Take 1 tablet (12.5 mg total) by mouth 2 (  two) times daily as needed for dizziness.    metoprolol succinate (TOPROL-XL) 50 MG 24 hr tablet TAKE 1 TABLET BY MOUTH ONCE DAILY WITH  OR  IMMEDIATELY  FOLLOWING  A  MEAL 02/07/2021: #30 filled 10/06/2020 - said no refills so patient thought she was supposed to stop - she did not follow up with MD.   omeprazole (PRILOSEC) 20 MG capsule Take 1 capsule by mouth once daily (Patient taking differently: Take 20 mg by mouth daily.)    ONE TOUCH LANCETS MISC USE TO CHECK BLOOD SUGAR TWICE A DAY AND PRN    topiramate (TOPAMAX) 25 MG tablet Take 1 tablet (25 mg total) by mouth 2 (two) times daily.    triamcinolone ointment (KENALOG) 0.5 % Apply topically 2 (two) times daily as needed.    No facility-administered encounter  medications on file as of 03/24/2021.    Allergies: No Known Allergies  Family History: Family History  Problem Relation Age of Onset   Diabetes Sister    Asthma Other    Stroke Other    Hypertension Other    Diabetes Mother    Stroke Mother    Cancer Father        pt states hae had some kind of stomach cancer, ? stomach or colon   Breast cancer Neg Hx     Social History: Social History   Tobacco Use   Smoking status: Never   Smokeless tobacco: Never  Vaping Use   Vaping Use: Never used  Substance Use Topics   Alcohol use: No   Drug use: No   Social History   Social History Narrative   Right Handed    Lives in a one story home     Vital Signs:  BP (!) 171/75   Pulse 69   Ht 5\' 4"  (1.626 m)   Wt 189 lb (85.7 kg)   SpO2 98%   BMI 32.44 kg/m   Neurological Exam: MENTAL STATUS including orientation to time, place, person, recent and remote memory, attention span and concentration, language, and fund of knowledge is normal.  Speech is not dysarthric.  CRANIAL NERVES: II:  No visual field defects.   III-IV-VI: Pupils equal round and reactive to light.  Normal conjugate, extra-ocular eye movements in all directions of gaze.  No nystagmus.  No ptosis.   V:  Normal facial sensation.    VII:  Normal facial symmetry and movements.   VIII:  Normal hearing and vestibular function.   IX-X:  Normal palatal movement.   XI:  Normal shoulder shrug and head rotation.   XII:  Normal tongue strength and range of motion, no deviation or fasciculation.  MOTOR:  No atrophy, fasciculations or abnormal movements.  No pronator drift.   Upper Extremity:  Right  Left  Deltoid  5/5   5/5   Biceps  5/5   5/5   Triceps  5/5   5/5   Infraspinatus 5/5  5/5  Medial pectoralis 5/5  5/5  Wrist extensors  5/5   5/5   Wrist flexors  5/5   5/5   Finger extensors  5/5   5/5   Finger flexors  5/5   5/5   Dorsal interossei  5/5   5/5   Abductor pollicis  5/5   5/5   Tone (Ashworth  scale)  0  0   Lower Extremity:  Right  Left  Hip flexors  5/5   5/5   Hip extensors  5/5  5/5   Adductor 5/5  5/5  Abductor 5/5  5/5  Knee flexors  5/5   5/5   Knee extensors  5/5   5/5   Dorsiflexors  5/5   5/5   Plantarflexors  5/5   5/5   Toe extensors  5/5   5/5   Toe flexors  5/5   5/5   Tone (Ashworth scale)  0  0   MSRs:  Right        Left                  brachioradialis 2+  2+  biceps 2+  2+  triceps 2+  2+  patellar 2+  2+  ankle jerk 2+  2+  Hoffman no  no  plantar response down  down   SENSORY:  Reduced vibration over the left MCP, absent below the ankles.  Temperature and pin prick reduced over the feet bilaterally.  Romberg's sign positive.   COORDINATION/GAIT: Normal finger-to- nose-finger.  Intact rapid alternating movements bilaterally.  Gait is slow, somewhat unsteady and cautious,assisted with a cane.   IMPRESSION:  Pontine stroke due to severe intracranial stenosis, small vessel disease; residual left hemisensory loss, dizziness, imbalance  - Continue maximal medical therapy with dual antiplatelet - ASA + plavix 75mg  x 4months, then aspirin 81mg  - Continue lipitor 80mg  - Aggressive risk factor management, specifically diabetes management as per primary - Continue PT - Recommend contacting social security office for disability and/or employment options  2.   Diabetic neuropathy affecting the feet and hands  - Patient educated on daily foot inspection, fall prevention, and safety precautions around the home.  Return to clinic in 4 months.  Total time spent reviewing records, interview, history/exam, documentation, counseling,  and coordination of care on day of encounter:  45 min  Thank you for allowing me to participate in patient's care.  If I can answer any additional questions, I would be pleased to do so.    Sincerely,    Shamell Suarez K. Posey Pronto, DO

## 2021-03-24 NOTE — Telephone Encounter (Signed)
Close encounter 

## 2021-03-24 NOTE — Patient Instructions (Addendum)
Continue aspirin and plavix for 75mg  through January, then continue aspirin 81mg  daily  Continue atorvastatin 80mg  daily  Continue physical therapy  Return to clinic in 4 months

## 2021-03-24 NOTE — Therapy (Signed)
[facility] [address] [place], [place], [address] Phone: [phone]   Fax:  [phone]  Physical Therapy Treatment  Patient Details  Name: [name] MRN: [id number] Date of Birth: 16-Mar-1961 Referring Provider (PT): Referred by Phillips Climes, MD. PCP is Betty [name], MD   Encounter Date: 03/24/2021   PT End of Session - 03/24/21 1105     Visit Number 10    Number of Visits 17    Date for PT Re-Evaluation 04/23/21    Authorization Type BCBS    PT Start Time 1102    PT Stop Time 1144    PT Time Calculation (min) 42 min    Equipment Utilized During Treatment Gait belt    Activity Tolerance Patient tolerated treatment well    Behavior During Therapy [facility] for tasks assessed/performed             Past Medical History:  Diagnosis Date   Abnormality of gait 05/10/2010   BACK PAIN 11/14/2008   Class 1 obesity due to excess calories with body mass index (BMI) of 31.0 to 31.9 in adult 02/07/2021   DIABETES MELLITUS, TYPE II 07/15/2008   Diplopia 07/15/2008   ECZEMA, ATOPIC 04/03/2009   Guillain-Barre ([place]) 1988   HYPERLIPIDEMIA 03/06/2009   HYPERTENSION 07/15/2008   Stroke ([place]) 2010, 2011   x2    Vertebral artery stenosis     Past Surgical History:  Procedure Laterality Date   ABDOMINAL HYSTERECTOMY     DILATION AND CURETTAGE OF UTERUS     FOOT SURGERY      There were no vitals filed for this visit.   Subjective Assessment - 03/24/21 1105     Subjective Patient reports no new changes/complaints. Reports knee is feeling good today. No falls or pain.    Patient is accompained by: Family member   family member in the lobby   Pertinent History CVA, Vertebral Artery Stenosis, HLD, HTN, Guillain - Barre, DM Type 2, Obesity, lymphoma    Limitations Standing;Walking;House hold activities    How long can you walk comfortably? 30 minutes    Diagnostic tests Pt found to have acute/subacute infarcts  to L pons and occlusion to L vertebral artery    Patient Stated Goals Improve the strength    Currently in Pain? No/denies               [facility] Adult PT Treatment/Exercise - 03/24/21 0001       Transfers   Transfers Sit to Stand;Stand to Sit    Sit to Stand 5: Supervision    Stand to Sit 5: Supervision      Ambulation/Gait   Ambulation/Gait Yes    Ambulation/Gait Assistance 5: Supervision    Ambulation/Gait Assistance Details throughout therapy session without AD    Assistive device Straight cane;None    Gait Pattern Step-through pattern;Decreased step length - left;Decreased stance time - left;Decreased stance time - right;Decreased dorsiflexion - left    Ambulation Surface Level;Indoor      High Level Balance   High Level Balance Activities Side stepping;Marching forwards    High Level Balance Comments in // bars with red theraband completed side stepping x 3 laps down and back, cues for step length. followed by marchign forwards x 2 laps down and back. Cues for increase hip/knee flexion.      Neuro Re-ed    Neuro Re-ed Details  Completed ambulation with addition of high level balance challenge upon command including head turns/nods to various  directions, sudden stops, gait speed changes, and backwards walking. CGA throughout, with most notable challenge with sudden stops as patient tends to want to continue to lean forward. Completed this x 400 ft, with addition of negotiating over obstalcles (orange hurdles) x 2 laps with supervision. No AD utilized.      Exercises   Exercises Knee/Hip      Knee/Hip Exercises: Aerobic   Other Aerobic Completed SciFit with BUE/BLE on Level 4.0 x 7 minutes for improved endurance/activity tolerance. Cues for maintaining steps per minute > 50. patient tolerating increase in resistance well.                 Balance Exercises - 03/24/21 0001       Balance Exercises: Standing   Wall Bumps Hip;Eyes opened;10 reps;Limitations    Wall  Bumps Limitations completed x 10 reps, with focus on proper technique    Rockerboard Anterior/posterior;EO;Intermittent UE support;Limitations    Rockerboard Limitations standing on rockerboard A/P: completed holding steady 2 x 30 seconds with eyes open, then horizontal/vertical head turns x 10 reps each. CGA with intermittent touch A to maintain balance.    Tandem Gait Forward;3 reps;Limitations    Tandem Gait Limitations x 3 laps on firm surface without UE support, CGA.                  PT Short Term Goals - 03/22/21 1243       PT SHORT TERM GOAL #1   Title Patient will be independent with initial HEP for strength/balance (ALL STGs Due: 03/26/21)    Baseline no HEP established 11/25 PT educated pt on performing HEP at counter instead of corner since pt states she is not able perform exercises due to cluttered corners    Time 4    Period Weeks    Status On-going    Target Date 03/26/21      PT SHORT TERM GOAL #2   Title Patient will be able to ambulate >/= 500 ft on indoor surfaces w/ LRAD vs. no AD at Mod I level    Baseline supervision/CGA, 115 ft 11/21 533ft w/ SPC    Time 4    Period Weeks    Status Achieved      PT SHORT TERM GOAL #3   Title Patient will improve gait speed to >/= 2.5 ft/sec    Baseline 2.21 ft/sec, 11/18 2.85 ft/sec no AD    Time 4    Period Weeks    Status Achieved      PT SHORT TERM GOAL #4   Title Patient will improve Berg Balance to >/= 45/56 to demo reduced fall risk and improved balance.    Baseline 40/56 11/21 49/56     Time 4    Period Weeks    Status Achieved               PT Long Term Goals - 02/22/21 1300       PT LONG TERM GOAL #1   Title Patient will be independent with final HEP for strength/balance and report completion >/= 5x/week for compliance upon d/c (ALL LTGs Due: 04/23/21)    Baseline no HEP established    Time 8    Period Weeks    Status New    Target Date 04/23/21      PT LONG TERM GOAL #2   Title Patient  will improve gait speed to >/= 3.0 ft/sec to demo improved community mobility    Baseline 2.21 ft/sec  Time 8    Period Weeks    Status New      PT LONG TERM GOAL #3   Title Patient will improve 5x sit <> stand to </= 12 seconds to demo improved balance and reduced fall risk    Baseline 17.28 secs    Time 8    Period Weeks    Status New      PT LONG TERM GOAL #4   Title Patient will improve Berg Balance to >/= 50/56 to demo reduced fall risk and improved balance    Baseline 40/56    Time 8    Period Weeks    Status New      PT LONG TERM GOAL #5   Title Patient will be able to ambulate to >/= 1000 ft outdoors on unlevel surfaces with Mod I and LRAD    Baseline TBA    Time 8    Period Weeks    Status New                   Plan - 03/24/21 1202     Clinical Impression Statement Today's skilled PT session focused on continud balance/strengthening to patient's tolerance, with more focus on dyanmic gait activites. Increased challenge noted with sudden stops requring CGA. Patient overall tolerating activites well. Progressed HEP and added in resistance for side-stepping. Will continue to progress toward all LTGs.    Personal Factors and Comorbidities Comorbidity 3+    Comorbidities CVA, Vertebral Artery Stenosis, HLD, HTN, Guillain - Barre, DM Type 2, Obesity, lymphoma    Examination-Activity Limitations Stairs;Stand;Locomotion Level;Bend;Squat    Examination-Participation Restrictions Occupation;Cleaning;Driving;Community Activity    Stability/Clinical Decision Making Evolving/Moderate complexity    Rehab Potential Good    PT Frequency 2x / week    PT Duration 8 weeks    PT Treatment/Interventions ADLs/Self Care Home Management;Aquatic Therapy;Canalith Repostioning;Biofeedback;Moist Heat;Electrical Stimulation;Cryotherapy;DME Instruction;Gait training;Stair training;Functional mobility training;Therapeutic activities;Therapeutic exercise;Balance training;Neuromuscular  re-education;Patient/family education;Orthotic Fit/Training;Manual techniques;Passive range of motion;Vestibular    PT Next Visit Plan Continue with balance training on compliant surfaces, dynamic gait activities. Gait on varied surfaces with and without cane. Gait outdoors? scifit/nustep for endurance training.    Consulted and Agree with Plan of Care Patient             Patient will benefit from skilled therapeutic intervention in order to improve the following deficits and impairments:  Abnormal gait, Decreased balance, Decreased endurance, Difficulty walking, Dizziness, Impaired sensation, Decreased range of motion, Decreased activity tolerance, Decreased coordination, Decreased knowledge of use of DME, Decreased strength  Visit Diagnosis: Unsteadiness on feet  Muscle weakness (generalized)  Difficulty in walking, not elsewhere classified  Other abnormalities of gait and mobility  Dizziness and giddiness     Problem List Patient Active Problem List   Diagnosis Date Noted   Atherosclerosis of aorta ([place]) 02/14/2021   Cerebral thrombosis with cerebral infarction 02/08/2021   Abnormal MRI of head 02/07/2021   Vertigo 02/07/2021   Class 1 obesity due to excess calories with body mass index (BMI) of 31.0 to 31.9 in adult 02/07/2021   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   Trigger finger, left ring finger 10/04/2018   Alternating constipation and diarrhea 04/20/2018   Diabetic peripheral neuropathy associated with type 2 diabetes mellitus ([place]) 02/26/2018   DM (diabetes mellitus), type 2 with peripheral vascular complications ([place]) 65/46/5035   ABNORMALITY OF GAIT 05/10/2010   ECZEMA, ATOPIC 04/03/2009   Hyperlipidemia associated with type 2 diabetes mellitus ([place]) 03/06/2009  BACK PAIN 11/14/2008   Cerebrovascular disease 08/05/2008   DIPLOPIA 07/15/2008   Essential hypertension 07/15/2008    Jones Bales, PT, DPT 03/24/2021, 12:03 PM  [facility] [address] [place], [place], [address] Phone: [phone]   Fax:  [phone]  Name: Sophi Calligan MRN: [id number] Date of Birth: June 24, 1960

## 2021-03-30 ENCOUNTER — Ambulatory Visit (HOSPITAL_COMMUNITY)
Admission: RE | Admit: 2021-03-30 | Discharge: 2021-03-30 | Disposition: A | Discharge status: Home / Self Care | Payer: BC Managed Care – PPO | Source: Ambulatory Visit | Attending: Cardiovascular Disease | Admitting: Cardiovascular Disease

## 2021-03-30 ENCOUNTER — Other Ambulatory Visit: Payer: Self-pay

## 2021-03-30 DIAGNOSIS — R079 Chest pain, unspecified: Secondary | ICD-10-CM | POA: Diagnosis present

## 2021-03-30 LAB — MYOCARDIAL PERFUSION IMAGING
LV dias vol: 78 mL (ref 46–106)
LV sys vol: 35 mL
Nuc Stress EF: 55 %
Peak HR: 81 {beats}/min
Rest HR: 62 {beats}/min
Rest Nuclear Isotope Dose: 8.5 mCi
SDS: 2
SRS: 2
SSS: 4
ST Depression (mm): 0 mm
Stress Nuclear Isotope Dose: 27 mCi
TID: 1.02

## 2021-03-30 MED ORDER — REGADENOSON 0.4 MG/5ML IV SOLN
0.4000 mg | Freq: Once | INTRAVENOUS | Status: AC
  Administered 2021-03-30: 0.4 mg via INTRAVENOUS

## 2021-03-30 MED ORDER — TECHNETIUM TC 99M TETROFOSMIN IV KIT
27.0000 | PACK | Freq: Once | INTRAVENOUS | Status: AC | PRN
  Administered 2021-03-30: 27 via INTRAVENOUS
  Filled 2021-03-30: qty 27

## 2021-03-30 MED ORDER — AMINOPHYLLINE 25 MG/ML IV SOLN
75.0000 mg | Freq: Once | INTRAVENOUS | Status: AC
  Administered 2021-03-30: 75 mg via INTRAVENOUS

## 2021-03-30 MED ORDER — TECHNETIUM TC 99M TETROFOSMIN IV KIT
8.5000 | PACK | Freq: Once | INTRAVENOUS | Status: AC | PRN
  Administered 2021-03-30: 8.5 via INTRAVENOUS
  Filled 2021-03-30: qty 9

## 2021-03-31 ENCOUNTER — Ambulatory Visit: Payer: BC Managed Care – PPO

## 2021-03-31 DIAGNOSIS — R2689 Other abnormalities of gait and mobility: Secondary | ICD-10-CM | POA: Diagnosis not present

## 2021-03-31 DIAGNOSIS — R262 Difficulty in walking, not elsewhere classified: Secondary | ICD-10-CM

## 2021-03-31 DIAGNOSIS — R2681 Unsteadiness on feet: Secondary | ICD-10-CM

## 2021-03-31 DIAGNOSIS — M6281 Muscle weakness (generalized): Secondary | ICD-10-CM

## 2021-03-31 NOTE — Therapy (Signed)
[facility] [address] [place], [place], [address] Phone: [phone]   Fax:  [phone]  Physical Therapy Treatment  Patient Details  Name: [name] MRN: [id number] Date of Birth: June 17, 1960 Referring Provider (PT): Referred by Phillips Climes, MD. PCP is Betty [name], MD   Encounter Date: 03/31/2021   PT End of Session - 03/31/21 1308     Visit Number 11    Number of Visits 17    Date for PT Re-Evaluation 04/23/21    Authorization Type BCBS    PT Start Time 1016    PT Stop Time 1058    PT Time Calculation (min) 42 min    Equipment Utilized During Treatment Gait belt    Activity Tolerance Patient tolerated treatment well    Behavior During Therapy [facility] for tasks assessed/performed             Past Medical History:  Diagnosis Date   Abnormality of gait 05/10/2010   BACK PAIN 11/14/2008   Class 1 obesity due to excess calories with body mass index (BMI) of 31.0 to 31.9 in adult 02/07/2021   DIABETES MELLITUS, TYPE II 07/15/2008   Diplopia 07/15/2008   ECZEMA, ATOPIC 04/03/2009   Guillain-Barre ([place]) 1988   HYPERLIPIDEMIA 03/06/2009   HYPERTENSION 07/15/2008   Stroke ([place]) 2010, 2011   x2    Vertebral artery stenosis     Past Surgical History:  Procedure Laterality Date   ABDOMINAL HYSTERECTOMY     DILATION AND CURETTAGE OF UTERUS     FOOT SURGERY      There were no vitals filed for this visit.   Subjective Assessment - 03/31/21 1019     Subjective Pt reports she is doing well and reports no falls. Pt reports her left leg is aching perhaps due to the rain.    Patient is accompained by: Family member   family member in the lobby   Pertinent History CVA, Vertebral Artery Stenosis, HLD, HTN, Guillain - Barre, DM Type 2, Obesity, lymphoma    Limitations Standing;Walking;House hold activities    How long can you walk comfortably? 30 minutes    Diagnostic tests Pt found to have  acute/subacute infarcts to L pons and occlusion to L vertebral artery    Patient Stated Goals Improve the strength    Currently in Pain? Yes    Pain Score 3     Pain Location Leg    Pain Orientation Left    Pain Descriptors / Indicators Aching    Pain Type Acute pain                               [facility] Adult PT Treatment/Exercise - 03/31/21 1020       Transfers   Transfers Sit to Stand;Stand to Sit    Sit to Stand 5: Supervision    Stand to Sit 5: Supervision      Ambulation/Gait   Ambulation/Gait Yes    Ambulation/Gait Assistance 5: Supervision    Ambulation Distance (Feet) 345 Feet    Assistive device None    Gait Pattern Step-through pattern;Decreased step length - left;Decreased stance time - left;Decreased stance time - right;Decreased dorsiflexion - left    Ambulation Surface Level;Indoor    Gait Comments Pt performed 3 laps around blue track of walking w/ changes in speed and sudden stops. Pt struggled to maintain balance after sudden stop regardless of what foot was in  front. PT provided min guard and told pt when to speed up, slow down, or stop.      High Level Balance   High Level Balance Comments Pt completed 3 laps of obstacle course that included 3 laps of hurdles on blue mat w/ reciprocal gait and then 6 stepping stones after. Pt demonstrated slight use of UE to maintain balance on occasion during obstacle course but the last lap did not require UE assitance. PT provided min guard and verbal cues for technique. Pt performed 3x8 of cone taps each BLE while standing on airex in //. Pt required occasional UE assist and PT min guard for balance.      Exercises   Exercises Other Exercises    Other Exercises  Pt performed 2 laps of side steps in // w/ red theraband. Pt required UE assist and verbal cues to maintain forward facing position. PT provided verbal cues for technique.                       PT Short Term Goals - 03/22/21 1243        PT SHORT TERM GOAL #1   Title Patient will be independent with initial HEP for strength/balance (ALL STGs Due: 03/26/21)    Baseline no HEP established 11/25 PT educated pt on performing HEP at counter instead of corner since pt states she is not able perform exercises due to cluttered corners    Time 4    Period Weeks    Status On-going    Target Date 03/26/21      PT SHORT TERM GOAL #2   Title Patient will be able to ambulate >/= 500 ft on indoor surfaces w/ LRAD vs. no AD at Mod I level    Baseline supervision/CGA, 115 ft 11/21 561ft w/ SPC    Time 4    Period Weeks    Status Achieved      PT SHORT TERM GOAL #3   Title Patient will improve gait speed to >/= 2.5 ft/sec    Baseline 2.21 ft/sec, 11/18 2.85 ft/sec no AD    Time 4    Period Weeks    Status Achieved      PT SHORT TERM GOAL #4   Title Patient will improve Berg Balance to >/= 45/56 to demo reduced fall risk and improved balance.    Baseline 40/56 11/21 49/56     Time 4    Period Weeks    Status Achieved               PT Long Term Goals - 02/22/21 1300       PT LONG TERM GOAL #1   Title Patient will be independent with final HEP for strength/balance and report completion >/= 5x/week for compliance upon d/c (ALL LTGs Due: 04/23/21)    Baseline no HEP established    Time 8    Period Weeks    Status New    Target Date 04/23/21      PT LONG TERM GOAL #2   Title Patient will improve gait speed to >/= 3.0 ft/sec to demo improved community mobility    Baseline 2.21 ft/sec    Time 8    Period Weeks    Status New      PT LONG TERM GOAL #3   Title Patient will improve 5x sit <> stand to </= 12 seconds to demo improved balance and reduced fall risk    Baseline 17.28 secs  Time 8    Period Weeks    Status New      PT LONG TERM GOAL #4   Title Patient will improve Berg Balance to >/= 50/56 to demo reduced fall risk and improved balance    Baseline 40/56    Time 8    Period Weeks    Status New       PT LONG TERM GOAL #5   Title Patient will be able to ambulate to >/= 1000 ft outdoors on unlevel surfaces with Mod I and LRAD    Baseline TBA    Time 8    Period Weeks    Status New                   Plan - 03/31/21 1309     Clinical Impression Statement Continued progress w/ dynamic balance and gait activities. Pt still presents w/ deficits in balance especially on compliant surfaces or when supporting herself on single lower extremity. Pt tolerated therapy well w/ no adverse s/s and appropriate fatigue and soreness at the conclusion of session. PT will continue to progress pt w/ activities to continue to progress pt balance and gait towards achieving LTG.    Personal Factors and Comorbidities Comorbidity 3+    Comorbidities CVA, Vertebral Artery Stenosis, HLD, HTN, Guillain - Barre, DM Type 2, Obesity, lymphoma    Examination-Activity Limitations Stairs;Stand;Locomotion Level;Bend;Squat    Examination-Participation Restrictions Occupation;Cleaning;Driving;Community Activity    Stability/Clinical Decision Making Evolving/Moderate complexity    Rehab Potential Good    PT Frequency 2x / week    PT Duration 8 weeks    PT Treatment/Interventions ADLs/Self Care Home Management;Aquatic Therapy;Canalith Repostioning;Biofeedback;Moist Heat;Electrical Stimulation;Cryotherapy;DME Instruction;Gait training;Stair training;Functional mobility training;Therapeutic activities;Therapeutic exercise;Balance training;Neuromuscular re-education;Patient/family education;Orthotic Fit/Training;Manual techniques;Passive range of motion;Vestibular    PT Next Visit Plan Continue with balance training on compliant surfaces, dynamic gait activities. Gait on varied surfaces with and without cane. Gait outdoors? scifit/nustep for endurance training.    Consulted and Agree with Plan of Care Patient             Patient will benefit from skilled therapeutic intervention in order to improve the following  deficits and impairments:  Abnormal gait, Decreased balance, Decreased endurance, Difficulty walking, Dizziness, Impaired sensation, Decreased range of motion, Decreased activity tolerance, Decreased coordination, Decreased knowledge of use of DME, Decreased strength  Visit Diagnosis: Unsteadiness on feet  Muscle weakness (generalized)  Difficulty in walking, not elsewhere classified  Other abnormalities of gait and mobility     Problem List Patient Active Problem List   Diagnosis Date Noted   Atherosclerosis of aorta ([place]) 02/14/2021   Cerebral thrombosis with cerebral infarction 02/08/2021   Abnormal MRI of head 02/07/2021   Vertigo 02/07/2021   Class 1 obesity due to excess calories with body mass index (BMI) of 31.0 to 31.9 in adult 02/07/2021   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   Trigger finger, left ring finger 10/04/2018   Alternating constipation and diarrhea 04/20/2018   Diabetic peripheral neuropathy associated with type 2 diabetes mellitus ([place]) 02/26/2018   DM (diabetes mellitus), type 2 with peripheral vascular complications ([place]) 63/89/3734   ABNORMALITY OF GAIT 05/10/2010   ECZEMA, ATOPIC 04/03/2009   Hyperlipidemia associated with type 2 diabetes mellitus ([place]) 03/06/2009   BACK PAIN 11/14/2008   Cerebrovascular disease 08/05/2008   DIPLOPIA 07/15/2008   Essential hypertension 07/15/2008    Lottie Mussel, Student-PT 03/31/2021, 1:11 PM  [facility] [place]  [place], [place], [address] Phone: [phone]   Fax:  [phone]  Name: [name] MRN: [id number] Date of Birth: 07-29-1960

## 2021-04-02 ENCOUNTER — Encounter: Payer: Self-pay | Admitting: Physical Therapy

## 2021-04-02 ENCOUNTER — Other Ambulatory Visit: Payer: Self-pay

## 2021-04-02 ENCOUNTER — Ambulatory Visit: Payer: BC Managed Care – PPO | Attending: Family Medicine | Admitting: Physical Therapy

## 2021-04-02 VITALS — BP 140/74 | HR 66

## 2021-04-02 DIAGNOSIS — R2689 Other abnormalities of gait and mobility: Secondary | ICD-10-CM | POA: Diagnosis present

## 2021-04-02 DIAGNOSIS — R262 Difficulty in walking, not elsewhere classified: Secondary | ICD-10-CM | POA: Diagnosis present

## 2021-04-02 DIAGNOSIS — M6281 Muscle weakness (generalized): Secondary | ICD-10-CM | POA: Diagnosis present

## 2021-04-02 DIAGNOSIS — R2681 Unsteadiness on feet: Secondary | ICD-10-CM | POA: Diagnosis present

## 2021-04-02 NOTE — Therapy (Signed)
[facility] [address] [place], [place], [address] Phone: [phone]   Fax:  [phone]  Physical Therapy Treatment  Patient Details  Name: [name] MRN: [id number] Date of Birth: Feb 15, 1961 Referring Provider (PT): Referred by Phillips Climes, MD. PCP is Betty [name], MD   Encounter Date: 04/02/2021   PT End of Session - 04/02/21 1105     Visit Number 12    Number of Visits 17    Date for PT Re-Evaluation 04/23/21    Authorization Type BCBS    PT Start Time 1104    PT Stop Time 1144    PT Time Calculation (min) 40 min    Equipment Utilized During Treatment Gait belt    Activity Tolerance Patient tolerated treatment well    Behavior During Therapy [facility] for tasks assessed/performed             Past Medical History:  Diagnosis Date   Abnormality of gait 05/10/2010   BACK PAIN 11/14/2008   Class 1 obesity due to excess calories with body mass index (BMI) of 31.0 to 31.9 in adult 02/07/2021   DIABETES MELLITUS, TYPE II 07/15/2008   Diplopia 07/15/2008   ECZEMA, ATOPIC 04/03/2009   Guillain-Barre ([place]) 1988   HYPERLIPIDEMIA 03/06/2009   HYPERTENSION 07/15/2008   Stroke ([place]) 2010, 2011   x2    Vertebral artery stenosis     Past Surgical History:  Procedure Laterality Date   ABDOMINAL HYSTERECTOMY     DILATION AND CURETTAGE OF UTERUS     FOOT SURGERY      Vitals:   04/02/21 1106 04/02/21 1134  BP: (!) 150/81 140/74  Pulse: 66      Subjective Assessment - 04/02/21 1106     Subjective Reports L leg is feeling a little funny today, might have been from the rain the other day.    Patient is accompained by: Family member   family member in the lobby   Pertinent History CVA, Vertebral Artery Stenosis, HLD, HTN, Guillain - Barre, DM Type 2, Obesity, lymphoma    Limitations Standing;Walking;House hold activities    How long can you walk comfortably? 30 minutes    Diagnostic tests Pt found to  have acute/subacute infarcts to L pons and occlusion to L vertebral artery    Patient Stated Goals Improve the strength    Currently in Pain? No/denies                               [facility] Adult PT Treatment/Exercise - 04/02/21 1110       Transfers   Comments With LLE staggered behind R on air ex, x5 reps sit <> stands with no UE support      Ambulation/Gait   Ambulation/Gait Yes    Ambulation/Gait Assistance 5: Supervision    Ambulation/Gait Assistance Details With no AD during session    Assistive device None    Gait Pattern Step-through pattern;Decreased step length - left;Decreased stance time - left;Decreased stance time - right;Decreased dorsiflexion - left    Ambulation Surface Level;Indoor      Knee/Hip Exercises: Aerobic   Other Aerobic Completed SciFit with BUE/BLE on Level 4.0 x 5 minutes for improved endurance/activity tolerance. Cues for maintaining steps per minute > 60                 Balance Exercises - 04/02/21 1118       Balance Exercises: Standing  Standing Eyes Closed Narrow base of support (BOS);Wide (BOA);Foam/compliant surface    Standing Eyes Closed Limitations 2 x 30 seconds with feet hip width, then progressed to feet together x15 seconds x25 seconds. with wide BOS - head turns 2 x 5 reps, head nods 2 x 5 reps    Stepping Strategy Posterior;UE support;Limitations    Stepping Strategy Limitations With LLE as stance leg on rocker board, stepping RLE on and off x10 reps, working on no UE support    Rockerboard Anterior/posterior;EO;Intermittent UE support;Limitations    Rockerboard Limitations Standing on rockerboard alternating SLS taps to cones x10 reps each leg, beginning with UE support> none, with min guard for balance    Tandem Gait Forward;3 reps;Limitations    Tandem Gait Limitations Down and back x3 reps on blue balance beam, working on without UE support    Sidestepping Foam/compliant support;Limitations    Sidestepping  Limitations completed side stepping down and back x 3 laps on balance beam with no UE support, cues for slowed and controlled and incr foot clearance    Other Standing Exercises --                  PT Short Term Goals - 03/22/21 1243       PT SHORT TERM GOAL #1   Title Patient will be independent with initial HEP for strength/balance (ALL STGs Due: 03/26/21)    Baseline no HEP established 11/25 PT educated pt on performing HEP at counter instead of corner since pt states she is not able perform exercises due to cluttered corners    Time 4    Period Weeks    Status On-going    Target Date 03/26/21      PT SHORT TERM GOAL #2   Title Patient will be able to ambulate >/= 500 ft on indoor surfaces w/ LRAD vs. no AD at Mod I level    Baseline supervision/CGA, 115 ft 11/21 555ft w/ SPC    Time 4    Period Weeks    Status Achieved      PT SHORT TERM GOAL #3   Title Patient will improve gait speed to >/= 2.5 ft/sec    Baseline 2.21 ft/sec, 11/18 2.85 ft/sec no AD    Time 4    Period Weeks    Status Achieved      PT SHORT TERM GOAL #4   Title Patient will improve Berg Balance to >/= 45/56 to demo reduced fall risk and improved balance.    Baseline 40/56 11/21 49/56     Time 4    Period Weeks    Status Achieved               PT Long Term Goals - 02/22/21 1300       PT LONG TERM GOAL #1   Title Patient will be independent with final HEP for strength/balance and report completion >/= 5x/week for compliance upon d/c (ALL LTGs Due: 04/23/21)    Baseline no HEP established    Time 8    Period Weeks    Status New    Target Date 04/23/21      PT LONG TERM GOAL #2   Title Patient will improve gait speed to >/= 3.0 ft/sec to demo improved community mobility    Baseline 2.21 ft/sec    Time 8    Period Weeks    Status New      PT LONG TERM GOAL #3   Title Patient will improve  5x sit <> stand to </= 12 seconds to demo improved balance and reduced fall risk    Baseline  17.28 secs    Time 8    Period Weeks    Status New      PT LONG TERM GOAL #4   Title Patient will improve Berg Balance to >/= 50/56 to demo reduced fall risk and improved balance    Baseline 40/56    Time 8    Period Weeks    Status New      PT LONG TERM GOAL #5   Title Patient will be able to ambulate to >/= 1000 ft outdoors on unlevel surfaces with Mod I and LRAD    Baseline TBA    Time 8    Period Weeks    Status New                   Plan - 04/02/21 1156     Clinical Impression Statement Worked on balance strategies today on compliant surfaces and strengthening/endurance on SciFit. Worked on decr UE support with SLS tasks on unlevel surfaces. PT tolerated session well, did need intermittent seated rest breaks.    Personal Factors and Comorbidities Comorbidity 3+    Comorbidities CVA, Vertebral Artery Stenosis, HLD, HTN, Guillain - Barre, DM Type 2, Obesity, lymphoma    Examination-Activity Limitations Stairs;Stand;Locomotion Level;Bend;Squat    Examination-Participation Restrictions Occupation;Cleaning;Driving;Community Activity    Stability/Clinical Decision Making Evolving/Moderate complexity    Rehab Potential Good    PT Frequency 2x / week    PT Duration 8 weeks    PT Treatment/Interventions ADLs/Self Care Home Management;Aquatic Therapy;Canalith Repostioning;Biofeedback;Moist Heat;Electrical Stimulation;Cryotherapy;DME Instruction;Gait training;Stair training;Functional mobility training;Therapeutic activities;Therapeutic exercise;Balance training;Neuromuscular re-education;Patient/family education;Orthotic Fit/Training;Manual techniques;Passive range of motion;Vestibular    PT Next Visit Plan Continue with balance training on compliant surfaces, dynamic gait activities. Gait on varied surfaces with and without cane. Gait outdoors? scifit/nustep for endurance training.    Consulted and Agree with Plan of Care Patient             Patient will benefit from  skilled therapeutic intervention in order to improve the following deficits and impairments:  Abnormal gait, Decreased balance, Decreased endurance, Difficulty walking, Dizziness, Impaired sensation, Decreased range of motion, Decreased activity tolerance, Decreased coordination, Decreased knowledge of use of DME, Decreased strength  Visit Diagnosis: Unsteadiness on feet  Muscle weakness (generalized)  Difficulty in walking, not elsewhere classified  Other abnormalities of gait and mobility     Problem List Patient Active Problem List   Diagnosis Date Noted   Atherosclerosis of aorta ([place]) 02/14/2021   Cerebral thrombosis with cerebral infarction 02/08/2021   Abnormal MRI of head 02/07/2021   Vertigo 02/07/2021   Class 1 obesity due to excess calories with body mass index (BMI) of 31.0 to 31.9 in adult 02/07/2021   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   Trigger finger, left ring finger 10/04/2018   Alternating constipation and diarrhea 04/20/2018   Diabetic peripheral neuropathy associated with type 2 diabetes mellitus ([place]) 02/26/2018   DM (diabetes mellitus), type 2 with peripheral vascular complications ([place]) 91/47/8295   ABNORMALITY OF GAIT 05/10/2010   ECZEMA, ATOPIC 04/03/2009   Hyperlipidemia associated with type 2 diabetes mellitus ([place]) 03/06/2009   BACK PAIN 11/14/2008   Cerebrovascular disease 08/05/2008   DIPLOPIA 07/15/2008   Essential hypertension 07/15/2008    Arliss Journey, PT, DPT  04/02/2021, 11:57 AM  [facility] [address] [place], [place],  [address] Phone: [phone]   Fax:  [phone]  Name: [name] MRN: [id number] Date of Birth: Sep 01, 1960

## 2021-04-07 ENCOUNTER — Ambulatory Visit: Payer: BC Managed Care – PPO

## 2021-04-07 ENCOUNTER — Other Ambulatory Visit: Payer: Self-pay

## 2021-04-07 VITALS — BP 160/92

## 2021-04-07 DIAGNOSIS — R262 Difficulty in walking, not elsewhere classified: Secondary | ICD-10-CM

## 2021-04-07 DIAGNOSIS — R2681 Unsteadiness on feet: Secondary | ICD-10-CM

## 2021-04-07 DIAGNOSIS — R2689 Other abnormalities of gait and mobility: Secondary | ICD-10-CM

## 2021-04-07 DIAGNOSIS — M6281 Muscle weakness (generalized): Secondary | ICD-10-CM

## 2021-04-07 NOTE — Therapy (Signed)
[facility] [address] [place], [place], [address] Phone: [phone]   Fax:  [phone]  Physical Therapy Treatment  Patient Details  Name: [name] MRN: [id number] Date of Birth: [birth year] Referring Provider (PT): Referred by Phillips Climes, MD. PCP is Betty [name], MD   Encounter Date: 04/07/2021   PT End of Session - 04/07/21 1104     Visit Number 13    Number of Visits 17    Date for PT Re-Evaluation 04/23/21    Authorization Type BCBS    PT Start Time 1102    PT Stop Time 1143    PT Time Calculation (min) 41 min    Equipment Utilized During Treatment Gait belt    Activity Tolerance Patient tolerated treatment well    Behavior During Therapy WFL for tasks assessed/performed             Past Medical History:  Diagnosis Date   Abnormality of gait 05/10/2010   BACK PAIN 11/14/2008   Class 1 obesity due to excess calories with body mass index (BMI) of 31.0 to 31.9 in adult 02/07/2021   DIABETES MELLITUS, TYPE II 07/15/2008   Diplopia 07/15/2008   ECZEMA, ATOPIC 04/03/2009   Guillain-Barre ([place]) 1988   HYPERLIPIDEMIA 03/06/2009   HYPERTENSION 07/15/2008   Stroke ([place]) 2010, 2011   x2    Vertebral artery stenosis     Past Surgical History:  Procedure Laterality Date   ABDOMINAL HYSTERECTOMY     DILATION AND CURETTAGE OF UTERUS     FOOT SURGERY      Vitals:   04/07/21 1108 04/07/21 1153  BP: (!) 148/86 (!) 160/92     Subjective Assessment - 04/07/21 1105     Subjective No new changes/complaints. Reports some intermittent achy in the L leg. No falls.    Patient is accompained by: Family member   family member in the lobby   Pertinent History CVA, Vertebral Artery Stenosis, HLD, HTN, Guillain - Barre, DM Type 2, Obesity, lymphoma    Limitations Standing;Walking;House hold activities    How long can you walk comfortably? 30 minutes    Diagnostic tests Pt found to have acute/subacute  infarcts to L pons and occlusion to L vertebral artery    Patient Stated Goals Improve the strength    Currently in Pain? Yes    Pain Score 5     Pain Location Leg    Pain Orientation Left    Pain Descriptors / Indicators Aching              [facility] Adult PT Treatment/Exercise - 04/07/21 0001       Ambulation/Gait   Ambulation/Gait Yes    Ambulation/Gait Assistance 5: Supervision    Ambulation/Gait Assistance Details throughout therapy session w/ activities    Assistive device None    Gait Pattern Step-through pattern;Decreased step length - left;Decreased stance time - left;Decreased stance time - right;Decreased dorsiflexion - left    Ambulation Surface Level;Indoor      High Level Balance   High Level Balance Activities Sudden stops;Backward walking;Head turns;Negotiating over obstacles    High Level Balance Comments Completed high level balance with ambulation, added in horiz/vertical head turns, sudden stops, backwards walking, and directions changes on command x 400 ft. patient tolerating well, most challenge CGA with backwards walking. At countertop on blue mat w/ orange hurdles: completed reciprocal stepping over hurdles x 3 laps down and back with single UE, then without UE completed step to pattern  over hurdles x 2 laps down and back.      Knee/Hip Exercises: Aerobic   Other Aerobic Completed SciFit with BUE/BLE on Level 4.5 x 6 minutes for improved endurance/activity tolerance. Cues for maintaining steps per minute > 60. Patient tolerating increase in resistance well.              Balance Exercises - 04/07/21 0001       Balance Exercises: Standing   Sidestepping Foam/compliant support;Limitations    Sidestepping Limitations completed side stepping down and back x 3 laps on balance beam with no UE support, cues for slowed and controlled and incr foot clearance.    Sit to Stand Foam/compliant surface;Without upper extremity support;Limitations    Sit to Stand  Limitations completed x 10 reps with BLE placed on airex, no UE support required. then second set completed staggerd stance with LLE postitioned posterior x 10 reps.    Other Standing Exercises on blue mat completd forward ambulation on unlrvel surface with alternsting toe tap to cone positioned on R/L side, completed x 4 laps down and back. increased challenge without UE support, requiring CGA for completion. Cues for slowed/control with completion to promote SLS.                  PT Short Term Goals - 03/22/21 1243       PT SHORT TERM GOAL #1   Title Patient will be independent with initial HEP for strength/balance (ALL STGs Due: 03/26/21)    Baseline no HEP established 11/25 PT educated pt on performing HEP at counter instead of corner since pt states she is not able perform exercises due to cluttered corners    Time 4    Period Weeks    Status On-going    Target Date 03/26/21      PT SHORT TERM GOAL #2   Title Patient will be able to ambulate >/= 500 ft on indoor surfaces w/ LRAD vs. no AD at Mod I level    Baseline supervision/CGA, 115 ft 11/21 573ft w/ SPC    Time 4    Period Weeks    Status Achieved      PT SHORT TERM GOAL #3   Title Patient will improve gait speed to >/= 2.5 ft/sec    Baseline 2.21 ft/sec, 11/18 2.85 ft/sec no AD    Time 4    Period Weeks    Status Achieved      PT SHORT TERM GOAL #4   Title Patient will improve Berg Balance to >/= 45/56 to demo reduced fall risk and improved balance.    Baseline 40/56 11/21 49/56     Time 4    Period Weeks    Status Achieved               PT Long Term Goals - 02/22/21 1300       PT LONG TERM GOAL #1   Title Patient will be independent with final HEP for strength/balance and report completion >/= 5x/week for compliance upon d/c (ALL LTGs Due: 04/23/21)    Baseline no HEP established    Time 8    Period Weeks    Status New    Target Date 04/23/21      PT LONG TERM GOAL #2   Title Patient will  improve gait speed to >/= 3.0 ft/sec to demo improved community mobility    Baseline 2.21 ft/sec    Time 8    Period Weeks    Status  New      PT LONG TERM GOAL #3   Title Patient will improve 5x sit <> stand to </= 12 seconds to demo improved balance and reduced fall risk    Baseline 17.28 secs    Time 8    Period Weeks    Status New      PT LONG TERM GOAL #4   Title Patient will improve Berg Balance to >/= 50/56 to demo reduced fall risk and improved balance    Baseline 40/56    Time 8    Period Weeks    Status New      PT LONG TERM GOAL #5   Title Patient will be able to ambulate to >/= 1000 ft outdoors on unlevel surfaces with Mod I and LRAD    Baseline TBA    Time 8    Period Weeks    Status New               Plan - 04/07/21 1158     Clinical Impression Statement Continued focus with challening balance on complaint surfaces, added in more dyanmic gait with most challenge noted with backwards walking. Continue to demo increased challenge with SLS activities requiring UE support intermitently. Will continue per POC.    Personal Factors and Comorbidities Comorbidity 3+    Comorbidities CVA, Vertebral Artery Stenosis, HLD, HTN, Guillain - Barre, DM Type 2, Obesity, lymphoma    Examination-Activity Limitations Stairs;Stand;Locomotion Level;Bend;Squat    Examination-Participation Restrictions Occupation;Cleaning;Driving;Community Activity    Stability/Clinical Decision Making Evolving/Moderate complexity    Rehab Potential Good    PT Frequency 2x / week    PT Duration 8 weeks    PT Treatment/Interventions ADLs/Self Care Home Management;Aquatic Therapy;Canalith Repostioning;Biofeedback;Moist Heat;Electrical Stimulation;Cryotherapy;DME Instruction;Gait training;Stair training;Functional mobility training;Therapeutic activities;Therapeutic exercise;Balance training;Neuromuscular re-education;Patient/family education;Orthotic Fit/Training;Manual techniques;Passive range of  motion;Vestibular    PT Next Visit Plan Continue with balance training on compliant surfaces, dynamic gait activities. Gait on varied surfaces with and without cane. Gait outdoors? scifit/nustep for endurance training.    Consulted and Agree with Plan of Care Patient             Patient will benefit from skilled therapeutic intervention in order to improve the following deficits and impairments:  Abnormal gait, Decreased balance, Decreased endurance, Difficulty walking, Dizziness, Impaired sensation, Decreased range of motion, Decreased activity tolerance, Decreased coordination, Decreased knowledge of use of DME, Decreased strength  Visit Diagnosis: Unsteadiness on feet  Muscle weakness (generalized)  Difficulty in walking, not elsewhere classified  Other abnormalities of gait and mobility     Problem List Patient Active Problem List   Diagnosis Date Noted   Atherosclerosis of aorta ([place]) 02/14/2021   Cerebral thrombosis with cerebral infarction 02/08/2021   Abnormal MRI of head 02/07/2021   Vertigo 02/07/2021   Class 1 obesity due to excess calories with body mass index (BMI) of 31.0 to 31.9 in adult 02/07/2021   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   Trigger finger, left ring finger 10/04/2018   Alternating constipation and diarrhea 04/20/2018   Diabetic peripheral neuropathy associated with type 2 diabetes mellitus ([place]) 02/26/2018   DM (diabetes mellitus), type 2 with peripheral vascular complications ([place]) 63/87/5643   ABNORMALITY OF GAIT 05/10/2010   ECZEMA, ATOPIC 04/03/2009   Hyperlipidemia associated with type 2 diabetes mellitus ([place]) 03/06/2009   BACK PAIN 11/14/2008   Cerebrovascular disease 08/05/2008   DIPLOPIA 07/15/2008   Essential hypertension 07/15/2008    Jones Bales, PT, DPT 04/07/2021, 11:59 AM  [facility] [address] [place], [place], [address] Phone: [phone]   Fax:   [phone]  Name: [name] MRN: [id number] Date of Birth: 24-Jul-1960

## 2021-04-09 ENCOUNTER — Other Ambulatory Visit: Payer: Self-pay

## 2021-04-09 ENCOUNTER — Ambulatory Visit: Payer: BC Managed Care – PPO

## 2021-04-09 DIAGNOSIS — R2681 Unsteadiness on feet: Secondary | ICD-10-CM

## 2021-04-09 DIAGNOSIS — R262 Difficulty in walking, not elsewhere classified: Secondary | ICD-10-CM

## 2021-04-09 DIAGNOSIS — M6281 Muscle weakness (generalized): Secondary | ICD-10-CM

## 2021-04-09 NOTE — Therapy (Signed)
[facility] [address] [place], [place], [address] Phone: [phone]   Fax:  [phone]  Physical Therapy Treatment  Patient Details  Name: [name] MRN: [id number] Date of Birth: December 10, 1960 Referring Provider (PT): Referred by Phillips Climes, MD. PCP is Betty [name], MD   Encounter Date: 04/09/2021   PT End of Session - 04/09/21 1136     Visit Number 14    Number of Visits 17    Date for PT Re-Evaluation 04/23/21    Authorization Type BCBS    PT Start Time 1018    PT Stop Time 1059    PT Time Calculation (min) 41 min    Equipment Utilized During Treatment Gait belt    Activity Tolerance Patient tolerated treatment well    Behavior During Therapy [facility] for tasks assessed/performed             Past Medical History:  Diagnosis Date   Abnormality of gait 05/10/2010   BACK PAIN 11/14/2008   Class 1 obesity due to excess calories with body mass index (BMI) of 31.0 to 31.9 in adult 02/07/2021   DIABETES MELLITUS, TYPE II 07/15/2008   Diplopia 07/15/2008   ECZEMA, ATOPIC 04/03/2009   Guillain-Barre ([place]) 1988   HYPERLIPIDEMIA 03/06/2009   HYPERTENSION 07/15/2008   Stroke ([place]) 2010, 2011   x2    Vertebral artery stenosis     Past Surgical History:  Procedure Laterality Date   ABDOMINAL HYSTERECTOMY     DILATION AND CURETTAGE OF UTERUS     FOOT SURGERY      There were no vitals filed for this visit.   Subjective Assessment - 04/09/21 1021     Subjective No new changes/complaints. No falls. Pt reports having to hold on to things still when walking around the apartment or getting out of the car. Pt reports having some twitching in L knee joint.    Patient is accompained by: Family member   family member in the lobby   Pertinent History CVA, Vertebral Artery Stenosis, HLD, HTN, Guillain - Barre, DM Type 2, Obesity, lymphoma    Limitations Standing;Walking;House hold activities    How long can  you walk comfortably? 30 minutes    Diagnostic tests Pt found to have acute/subacute infarcts to L pons and occlusion to L vertebral artery    Patient Stated Goals Improve the strength    Currently in Pain? No/denies                               [facility] Adult PT Treatment/Exercise - 04/09/21 1027       Transfers   Transfers Sit to Stand;Stand to Sit    Sit to Stand 5: Supervision    Stand to Sit 5: Supervision    Comments Pt performed 3x10 sit to stands w/ RLE on 2 inch step to create more weight shift on LLE. Pt used UE to push off of knees to stand. PT verbally cued pt to control descent to avoid plopping down.      Ambulation/Gait   Gait Comments In // pt performed 3 laps of backwards walking w/o UE support. Pt has tendency to cross legs when stepping backwards. PT provided tactile and verbal cues to maintain upright posture and keep legs from crossing over.      Neuro Re-ed    Neuro Re-ed Details  In // pt performed cone taps while standing on blue foam  balance beam. Pt did not require UE assist but demonstrated hesitancy when having to support herself on LLE. PT provided min guard and verbal cues for technique.      Exercises   Exercises Other Exercises    Other Exercises  In // on 4 inch step w/ airex pad on top pt perfromed 2x10 step ups w/ LLE up and RLE down. Pt did not require UE assist and PT provided min guard and verbal cues for technique.      Knee/Hip Exercises: Aerobic   Other Aerobic Completed SciFit with BUE/BLE on Level 5.0 x 6 minutes for improved endurance/activity tolerance. Cues for maintaining steps per minute > 60. Patient tolerating increase in resistance well.                       PT Short Term Goals - 03/22/21 1243       PT SHORT TERM GOAL #1   Title Patient will be independent with initial HEP for strength/balance (ALL STGs Due: 03/26/21)    Baseline no HEP established 11/25 PT educated pt on performing HEP at counter  instead of corner since pt states she is not able perform exercises due to cluttered corners    Time 4    Period Weeks    Status On-going    Target Date 03/26/21      PT SHORT TERM GOAL #2   Title Patient will be able to ambulate >/= 500 ft on indoor surfaces w/ LRAD vs. no AD at Mod I level    Baseline supervision/CGA, 115 ft 11/21 55ft w/ SPC    Time 4    Period Weeks    Status Achieved      PT SHORT TERM GOAL #3   Title Patient will improve gait speed to >/= 2.5 ft/sec    Baseline 2.21 ft/sec, 11/18 2.85 ft/sec no AD    Time 4    Period Weeks    Status Achieved      PT SHORT TERM GOAL #4   Title Patient will improve Berg Balance to >/= 45/56 to demo reduced fall risk and improved balance.    Baseline 40/56 11/21 49/56     Time 4    Period Weeks    Status Achieved               PT Long Term Goals - 02/22/21 1300       PT LONG TERM GOAL #1   Title Patient will be independent with final HEP for strength/balance and report completion >/= 5x/week for compliance upon d/c (ALL LTGs Due: 04/23/21)    Baseline no HEP established    Time 8    Period Weeks    Status New    Target Date 04/23/21      PT LONG TERM GOAL #2   Title Patient will improve gait speed to >/= 3.0 ft/sec to demo improved community mobility    Baseline 2.21 ft/sec    Time 8    Period Weeks    Status New      PT LONG TERM GOAL #3   Title Patient will improve 5x sit <> stand to </= 12 seconds to demo improved balance and reduced fall risk    Baseline 17.28 secs    Time 8    Period Weeks    Status New      PT LONG TERM GOAL #4   Title Patient will improve Berg Balance to >/= 50/56 to demo  reduced fall risk and improved balance    Baseline 40/56    Time 8    Period Weeks    Status New      PT LONG TERM GOAL #5   Title Patient will be able to ambulate to >/= 1000 ft outdoors on unlevel surfaces with Mod I and LRAD    Baseline TBA    Time 8    Period Weeks    Status New                    Plan - 04/09/21 1137     Clinical Impression Statement Today's therapy session focused on continued improvements in dynamic balance as well as LLE strengthening. Pt continues to demonstrate difficutly w/ ambulating backwards and balance on complaint surfaces. PT will continue to progress pt towards LTG.    Personal Factors and Comorbidities Comorbidity 3+    Comorbidities CVA, Vertebral Artery Stenosis, HLD, HTN, Guillain - Barre, DM Type 2, Obesity, lymphoma    Examination-Activity Limitations Stairs;Stand;Locomotion Level;Bend;Squat    Examination-Participation Restrictions Occupation;Cleaning;Driving;Community Activity    Stability/Clinical Decision Making Evolving/Moderate complexity    Rehab Potential Good    PT Frequency 2x / week    PT Duration 8 weeks    PT Treatment/Interventions ADLs/Self Care Home Management;Aquatic Therapy;Canalith Repostioning;Biofeedback;Moist Heat;Electrical Stimulation;Cryotherapy;DME Instruction;Gait training;Stair training;Functional mobility training;Therapeutic activities;Therapeutic exercise;Balance training;Neuromuscular re-education;Patient/family education;Orthotic Fit/Training;Manual techniques;Passive range of motion;Vestibular    PT Next Visit Plan Initiated potential discharge next week but confirm next visit, Pt states she wants to focus on balance as she says she has to furniture walk and states difficulty getting out of car, Continue with balance training on compliant surfaces, dynamic gait activities. Gait on varied surfaces with and without cane. Gait outdoors? scifit/nustep for endurance training.    Consulted and Agree with Plan of Care Patient             Patient will benefit from skilled therapeutic intervention in order to improve the following deficits and impairments:  Abnormal gait, Decreased balance, Decreased endurance, Difficulty walking, Dizziness, Impaired sensation, Decreased range of motion, Decreased activity  tolerance, Decreased coordination, Decreased knowledge of use of DME, Decreased strength  Visit Diagnosis: Unsteadiness on feet  Muscle weakness (generalized)  Difficulty in walking, not elsewhere classified     Problem List Patient Active Problem List   Diagnosis Date Noted   Atherosclerosis of aorta ([place]) 02/14/2021   Cerebral thrombosis with cerebral infarction 02/08/2021   Abnormal MRI of head 02/07/2021   Vertigo 02/07/2021   Class 1 obesity due to excess calories with body mass index (BMI) of 31.0 to 31.9 in adult 02/07/2021   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   Trigger finger, left ring finger 10/04/2018   Alternating constipation and diarrhea 04/20/2018   Diabetic peripheral neuropathy associated with type 2 diabetes mellitus ([place]) 02/26/2018   DM (diabetes mellitus), type 2 with peripheral vascular complications ([place]) 99/37/1696   ABNORMALITY OF GAIT 05/10/2010   ECZEMA, ATOPIC 04/03/2009   Hyperlipidemia associated with type 2 diabetes mellitus ([place]) 03/06/2009   BACK PAIN 11/14/2008   Cerebrovascular disease 08/05/2008   DIPLOPIA 07/15/2008   Essential hypertension 07/15/2008    Lottie Mussel, Student-PT 04/09/2021, 11:40 AM  [facility] [address] [place], [place], [address] Phone: [phone]   Fax:  [phone]  Name: [name] MRN: [id number] Date of Birth: Jun 17, 1960

## 2021-04-13 ENCOUNTER — Other Ambulatory Visit: Payer: Self-pay

## 2021-04-13 ENCOUNTER — Encounter: Payer: Self-pay | Admitting: Cardiovascular Disease

## 2021-04-13 ENCOUNTER — Ambulatory Visit (INDEPENDENT_AMBULATORY_CARE_PROVIDER_SITE_OTHER): Payer: BC Managed Care – PPO | Admitting: Cardiovascular Disease

## 2021-04-13 DIAGNOSIS — I739 Peripheral vascular disease, unspecified: Secondary | ICD-10-CM | POA: Diagnosis not present

## 2021-04-13 MED ORDER — CILOSTAZOL 50 MG PO TABS
50.0000 mg | ORAL_TABLET | Freq: Two times a day (BID) | ORAL | 1 refills | Status: DC
Start: 2021-04-13 — End: 2021-11-17

## 2021-04-13 NOTE — Assessment & Plan Note (Signed)
[name] was referred to me by Dr. Harl Bowie for evaluation of PAD.  She does have multiple cardiac risk factors.  She had a stroke back in August and is still getting rehab from this.  She walks with a cane.  She has some left-sided symptoms which continue.  She does complain of some atypical pain in her left greater than right leg.  She had Doppler studies performed 03/22/2021 revealing a normal right ABI with a left ABI of 0.82 and a high-frequency signal in her proximal left SFA.  I am going to begin her on Pletal 50 mg p.o. twice daily I will see her back in 3 months.  If he still experiencing left lower extremity symptoms we will discuss performing angiography and endovascular therapy.

## 2021-04-13 NOTE — Progress Notes (Signed)
04/13/2021 [name]   1960-06-15  [id number]  Primary Physician [name], [name], MD Primary Cardiologist: Lorretta Harp MD Garret Reddish, [place], [place]  HPI:  [name] is a 60 y.o. moderately overweight divorced African-American female with no children who currently is unemployed because of a recent stroke.  She worked as a Sports coach prior to that.  She was referred by Dr. Phineas Inches for evaluation of symptomatic PAD.  Risk factors include treated hypertension, diabetes and hyperlipidemia.  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  She is currently undergoing rehab and walks with a cane.  She lives with her sister.  She denies chest pain or shortness of breath.  She does complain of some left greater than right lower extremity symptoms which have some claudication-like features.  She had recent Dopplers performed 03/22/2021 revealed a normal right ABI with a left ABI of 0.82 and a high-frequency signal in her proximal left SFA.   Current Meds  Medication Sig   acetaminophen (TYLENOL) 500 MG tablet Take 2 tablets (1,000 mg total) by mouth every 6 (six) hours as needed.   amLODipine (NORVASC) 10 MG tablet Take 1 tablet by mouth once daily (Patient taking differently: Take 10 mg by mouth daily.)   aspirin EC 81 MG tablet Take 1 tablet (81 mg total) by mouth daily. Swallow whole.   atorvastatin (LIPITOR) 80 MG tablet Take 1 tablet (80 mg total) by mouth daily.   Blood Pressure Monitoring (BLOOD PRESSURE MONITOR AUTOMAT) DEVI 1 Device by Does not apply route daily.   cilostazol (PLETAL) 50 MG tablet Take 1 tablet (50 mg total) by mouth 2 (two) times daily.   clobetasol ointment (TEMOVATE) 0.05 % Apply topically at bedtime.   clopidogrel (PLAVIX) 75 MG tablet Take 1 tablet (75 mg total) by mouth daily.   Continuous Blood Gluc Sensor (FREESTYLE LIBRE 2 SENSOR) MISC 1 Device by Does not apply route every 14 (fourteen)  days.   FLUoxetine (PROZAC) 20 MG capsule Take 1 capsule (20 mg total) by mouth daily.   fluticasone (FLONASE) 50 MCG/ACT nasal spray Place 1 spray into both nostrils 2 (two) times daily.   glucose blood (CONTOUR NEXT TEST) test strip 1 each by Other route 2 (two) times daily. And lancets 2/day   glucose blood (ONETOUCH VERIO) test strip USE TO CHECK BLOOD SUGAR TWICE A DAY AND PRN   hydrALAZINE (APRESOLINE) 50 MG tablet Take 1 tablet (50 mg total) by mouth 3 (three) times daily.   insulin glargine (LANTUS) 100 UNIT/ML Solostar Pen Inject 160 Units into the skin every morning. And pen needles 1/day   lisinopril-hydrochlorothiazide (ZESTORETIC) 20-12.5 MG tablet Take 2 tablets by mouth once daily (Patient taking differently: Take 2 tablets by mouth daily.)   meclizine (ANTIVERT) 12.5 MG tablet Take 1 tablet (12.5 mg total) by mouth 2 (two) times daily as needed for dizziness.   metoprolol succinate (TOPROL-XL) 50 MG 24 hr tablet TAKE 1 TABLET BY MOUTH ONCE DAILY WITH  OR  IMMEDIATELY  FOLLOWING  A  MEAL   omeprazole (PRILOSEC) 20 MG capsule Take 1 capsule by mouth once daily (Patient taking differently: Take 20 mg by mouth daily.)   ONE TOUCH LANCETS MISC USE TO CHECK BLOOD SUGAR TWICE A DAY AND PRN   triamcinolone ointment (KENALOG) 0.5 % Apply topically 2 (two) times daily as needed.     No Known Allergies  Social History  Socioeconomic History   Marital status: Single    Spouse name: Not on file   Number of children: 0   Years of education: Not on file   Highest education level: Not on file  Occupational History   Occupation: custodian at [employer]	[place]  Tobacco Use   Smoking status: Never   Smokeless tobacco: Never  Vaping Use   Vaping Use: Never used  Substance and Sexual Activity   Alcohol use: No   Drug use: No   Sexual activity: Not on file  Other Topics Concern   Not on file  Social History Narrative   Right Handed    Lives in a one story home    Social  Determinants of Health   Financial Resource Strain: Not on file  Food Insecurity: Not on file  Transportation Needs: Not on file  Physical Activity: Not on file  Stress: Not on file  Social Connections: Not on file  Intimate Partner Violence: Not on file     Review of Systems: General: negative for chills, fever, night sweats or weight changes.  Cardiovascular: negative for chest pain, dyspnea on exertion, edema, orthopnea, palpitations, paroxysmal nocturnal dyspnea or shortness of breath Dermatological: negative for rash Respiratory: negative for cough or wheezing Urologic: negative for hematuria Abdominal: negative for nausea, vomiting, diarrhea, bright red blood per rectum, melena, or hematemesis Neurologic: negative for visual changes, syncope, or dizziness All other systems reviewed and are otherwise negative except as noted above.    Blood pressure (!) 160/90, pulse 78, height 5\' 4"  (1.626 m), weight 189 lb (85.7 kg), SpO2 96 %.  General appearance: alert and no distress Neck: no adenopathy, no carotid bruit, no JVD, supple, symmetrical, trachea midline, and thyroid not enlarged, symmetric, no tenderness/mass/nodules Lungs: clear to auscultation bilaterally Heart: regular rate and rhythm, S1, S2 normal, no murmur, click, rub or gallop Extremities: extremities normal, atraumatic, no cyanosis or edema Pulses: 2+ and symmetric Skin: Skin color, texture, turgor normal. No rashes or lesions Neurologic: Grossly normal  EKG not performed today  ASSESSMENT AND PLAN:   Peripheral arterial disease ([place]) [name] was referred to me by Dr. Harl Bowie for evaluation of PAD.  She does have multiple cardiac risk factors.  She had a stroke back in August and is still getting rehab from this.  She walks with a cane.  She has some left-sided symptoms which continue.  She does complain of some atypical pain in her left greater than right leg.  She had Doppler studies performed 03/22/2021  revealing a normal right ABI with a left ABI of 0.82 and a high-frequency signal in her proximal left SFA.  I am going to begin her on Pletal 50 mg p.o. twice daily I will see her back in 3 months.  If he still experiencing left lower extremity symptoms we will discuss performing angiography and endovascular therapy.     Lorretta Harp MD FACP,FACC,FAHA, [facility] 04/13/2021 11:10 AM

## 2021-04-13 NOTE — Patient Instructions (Signed)
Medication Instructions:   -Start taking pletal (cilostazol) 50mg  twice daily.  *If you need a refill on your cardiac medications before your next appointment, please call your pharmacy*   Follow-Up: At [facility], you and your health needs are our priority.  As part of our continuing mission to provide you with exceptional heart care, we have created designated Provider Care Teams.  These Care Teams include your primary Cardiologist (physician) and Advanced Practice Providers (APPs -  Physician Assistants and Nurse Practitioners) who all work together to provide you with the care you need, when you need it.  We recommend signing up for the patient portal called "MyChart".  Sign up information is provided on this After Visit Summary.  MyChart is used to connect with patients for Virtual Visits (Telemedicine).  Patients are able to view lab/test results, encounter notes, upcoming appointments, etc.  Non-urgent messages can be sent to your provider as well.   To learn more about what you can do with MyChart, go to [email or web address].    Your next appointment:   3 month(s)  The format for your next appointment:   In Person  Provider: Quay Burow, MD

## 2021-04-14 ENCOUNTER — Ambulatory Visit: Payer: BC Managed Care – PPO

## 2021-04-14 DIAGNOSIS — M6281 Muscle weakness (generalized): Secondary | ICD-10-CM

## 2021-04-14 DIAGNOSIS — R2681 Unsteadiness on feet: Secondary | ICD-10-CM

## 2021-04-14 DIAGNOSIS — R262 Difficulty in walking, not elsewhere classified: Secondary | ICD-10-CM

## 2021-04-14 DIAGNOSIS — R2689 Other abnormalities of gait and mobility: Secondary | ICD-10-CM

## 2021-04-14 NOTE — Therapy (Signed)
[facility] [address] [place], [place], [address] Phone: [phone]   Fax:  [phone]  Physical Therapy Treatment  Patient Details  Name: [name] MRN: [id number] Date of Birth: 1960/06/05 Referring Provider (PT): Referred by Phillips Climes, MD. PCP is Betty [name], MD   Encounter Date: 04/14/2021   PT End of Session - 04/14/21 1100     Visit Number 15    Number of Visits 17    Date for PT Re-Evaluation 04/23/21    Authorization Type BCBS    PT Start Time 1100    PT Stop Time 1143    PT Time Calculation (min) 43 min    Equipment Utilized During Treatment Gait belt    Activity Tolerance Patient tolerated treatment well    Behavior During Therapy [facility] for tasks assessed/performed             Past Medical History:  Diagnosis Date   Abnormality of gait 05/10/2010   BACK PAIN 11/14/2008   Class 1 obesity due to excess calories with body mass index (BMI) of 31.0 to 31.9 in adult 02/07/2021   DIABETES MELLITUS, TYPE II 07/15/2008   Diplopia 07/15/2008   ECZEMA, ATOPIC 04/03/2009   Guillain-Barre ([place]) 1988   HYPERLIPIDEMIA 03/06/2009   HYPERTENSION 07/15/2008   Stroke ([place]) 2010, 2011   x2    Vertebral artery stenosis     Past Surgical History:  Procedure Laterality Date   ABDOMINAL HYSTERECTOMY     DILATION AND CURETTAGE OF UTERUS     FOOT SURGERY      There were no vitals filed for this visit.   Subjective Assessment - 04/14/21 1103     Subjective No other new changes or complaints. No falls.    Patient is accompained by: Family member   family member in the lobby   Pertinent History CVA, Vertebral Artery Stenosis, HLD, HTN, Guillain - Barre, DM Type 2, Obesity, lymphoma    Limitations Standing;Walking;House hold activities    How long can you walk comfortably? 30 minutes    Diagnostic tests Pt found to have acute/subacute infarcts to L pons and occlusion to L vertebral artery     Patient Stated Goals Improve the strength    Currently in Pain? No/denies              [facility] Adult PT Treatment/Exercise - 04/14/21 0001       Transfers   Transfers Sit to Stand;Stand to Sit    Sit to Stand 5: Supervision    Five time sit to stand comments  14.31 secs with BUE support from standard chair.    Stand to Sit 5: Supervision    Comments completed sit <:> stands with LLE positioned posteriorly x 10 reps with BUE support on knees.      Ambulation/Gait   Ambulation/Gait Yes    Ambulation/Gait Assistance 5: Supervision    Ambulation/Gait Assistance Details throughout therapy session without use of AD    Assistive device None    Gait Pattern Step-through pattern;Decreased step length - left;Decreased stance time - left;Decreased stance time - right;Decreased dorsiflexion - left    Ambulation Surface Level;Indoor    Gait velocity 3.42 ft/sec      High Level Balance   High Level Balance Activities Negotiating over obstacles    High Level Balance Comments at countertop completed alternaitng toe tap with BLE, followed by step over cone x 4 laps down and back.      Exercises  Exercises Knee/Hip      Knee/Hip Exercises: Aerobic   Other Aerobic Completed SciFit with BUE/BLE on Level 5.0 x 6 minutes for improved endurance/activity tolerance. Cues for maintaining steps per minute > 60. Patient tolerating increase in resistance well.      Knee/Hip Exercises: Standing   Heel Raises Both;2 sets;10 reps;Limitations    Heel Raises Limitations with light UE support at countertop    Hip Abduction Stengthening;Both;2 sets;10 reps;Knee straight;Limitations    Abduction Limitations cues for technique, light UE support from mat.               Balance Exercises - 04/14/21 0001       Balance Exercises: Standing   Stepping Strategy Anterior;Posterior;Foam/compliant surface;Limitations    Stepping Strategy Limitations completed x 10 reps anterior/posterior steppign strategy  alternating on BLE off of airex. intermittent CGA required. Cues for step length.    Tandem Gait Forward;Intermittent upper extremity support;Foam/compliant surface;Limitations    Tandem Gait Limitations completed down and back x3 reps on blue mat, working on without UE support                PT Education - 04/14/21 1108     Education Details progress toward LTGs; will finish assessing LTG next visit and potential D/C.    Person(s) Educated Patient    Methods Explanation    Comprehension Verbalized understanding              PT Short Term Goals - 03/22/21 1243       PT SHORT TERM GOAL #1   Title Patient will be independent with initial HEP for strength/balance (ALL STGs Due: 03/26/21)    Baseline no HEP established 11/25 PT educated pt on performing HEP at counter instead of corner since pt states she is not able perform exercises due to cluttered corners    Time 4    Period Weeks    Status On-going    Target Date 03/26/21      PT SHORT TERM GOAL #2   Title Patient will be able to ambulate >/= 500 ft on indoor surfaces w/ LRAD vs. no AD at Mod I level    Baseline supervision/CGA, 115 ft 11/21 547f w/ SPC    Time 4    Period Weeks    Status Achieved      PT SHORT TERM GOAL #3   Title Patient will improve gait speed to >/= 2.5 ft/sec    Baseline 2.21 ft/sec, 11/18 2.85 ft/sec no AD    Time 4    Period Weeks    Status Achieved      PT SHORT TERM GOAL #4   Title Patient will improve Berg Balance to >/= 45/56 to demo reduced fall risk and improved balance.    Baseline 40/56 11/21 49/56     Time 4    Period Weeks    Status Achieved               PT Long Term Goals - 04/14/21 1114       PT LONG TERM GOAL #1   Title Patient will be independent with final HEP for strength/balance and report completion >/= 5x/week for compliance upon d/c (ALL LTGs Due: 04/23/21)    Baseline no HEP established    Time 8    Period Weeks    Status New    Target Date  04/23/21      PT LONG TERM GOAL #2   Title Patient will improve gait speed  to >/= 3.0 ft/sec to demo improved community mobility    Baseline 2.21 ft/sec; 3.42 ft/sec    Time 8    Period Weeks    Status Achieved      PT LONG TERM GOAL #3   Title Patient will improve 5x sit <> stand to </= 12 seconds to demo improved balance and reduced fall risk    Baseline 17.28 secs; 14.31 seconds    Time 8    Period Weeks    Status Not Met      PT LONG TERM GOAL #4   Title Patient will improve Berg Balance to >/= 50/56 to demo reduced fall risk and improved balance    Baseline 40/56    Time 8    Period Weeks    Status New      PT LONG TERM GOAL #5   Title Patient will be able to ambulate to >/= 1000 ft outdoors on unlevel surfaces with Mod I and LRAD    Baseline TBA    Time 8    Period Weeks    Status New                   Plan - 04/14/21 1147     Clinical Impression Statement Began assesment of patient's progress toward LTGs. Patient able to meet LTG #2 with gait speed of 3.42 ft/sec without use of SPC demonstrating community ambulator. Patietn made progress toward LTG #3, however just shy of goal level with 14.31 secs on 5x sit <> stand. Rest of session focused on continued high level balance and strengthening activities. Will contiue to progress toward LTGs.    Personal Factors and Comorbidities Comorbidity 3+    Comorbidities CVA, Vertebral Artery Stenosis, HLD, HTN, Guillain - Barre, DM Type 2, Obesity, lymphoma    Examination-Activity Limitations Stairs;Stand;Locomotion Level;Bend;Squat    Examination-Participation Restrictions Occupation;Cleaning;Driving;Community Activity    Stability/Clinical Decision Making Evolving/Moderate complexity    Rehab Potential Good    PT Frequency 2x / week    PT Duration 8 weeks    PT Treatment/Interventions ADLs/Self Care Home Management;Aquatic Therapy;Canalith Repostioning;Biofeedback;Moist Heat;Electrical Stimulation;Cryotherapy;DME  Instruction;Gait training;Stair training;Functional mobility training;Therapeutic activities;Therapeutic exercise;Balance training;Neuromuscular re-education;Patient/family education;Orthotic Fit/Training;Manual techniques;Passive range of motion;Vestibular    PT Next Visit Plan Finish checking LTGs and determine if D/c or re-cert. Pt is okay wtih d/c if we meet goals. If shy maybe re-cert for 1x/week for a couple additional weeks. Continue with balance training on compliant surfaces, dynamic gait activities. Gait on varied surfaces with and without cane. Gait outdoors? scifit/nustep for endurance training.    Consulted and Agree with Plan of Care Patient             Patient will benefit from skilled therapeutic intervention in order to improve the following deficits and impairments:  Abnormal gait, Decreased balance, Decreased endurance, Difficulty walking, Dizziness, Impaired sensation, Decreased range of motion, Decreased activity tolerance, Decreased coordination, Decreased knowledge of use of DME, Decreased strength  Visit Diagnosis: Unsteadiness on feet  Muscle weakness (generalized)  Difficulty in walking, not elsewhere classified  Other abnormalities of gait and mobility     Problem List Patient Active Problem List   Diagnosis Date Noted   Peripheral arterial disease ([place]) 04/13/2021   Atherosclerosis of aorta ([place]) 02/14/2021   Cerebral thrombosis with cerebral infarction 02/08/2021   Abnormal MRI of head 02/07/2021   Vertigo 02/07/2021   Class 1 obesity due to excess calories with body mass index (BMI) of 31.0 to 31.9 in  adult 02/07/2021   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   Trigger finger, left ring finger 10/04/2018   Alternating constipation and diarrhea 04/20/2018   Diabetic peripheral neuropathy associated with type 2 diabetes mellitus ([place]) 02/26/2018   DM (diabetes mellitus), type 2 with peripheral vascular complications ([place]) 92/95/7473   ABNORMALITY  OF GAIT 05/10/2010   ECZEMA, ATOPIC 04/03/2009   Hyperlipidemia associated with type 2 diabetes mellitus ([place]) 03/06/2009   BACK PAIN 11/14/2008   Cerebrovascular disease 08/05/2008   DIPLOPIA 07/15/2008   Essential hypertension 07/15/2008    Jones Bales, PT, DPT 04/14/2021, 11:49 AM  [facility] [address] [place], [place], [address] Phone: [phone]   Fax:  [phone]  Name: Keturah Yerby MRN: [id number] Date of Birth: 1961/04/15

## 2021-04-16 ENCOUNTER — Ambulatory Visit: Payer: BC Managed Care – PPO | Admitting: Physical Therapy

## 2021-04-16 ENCOUNTER — Other Ambulatory Visit: Payer: Self-pay

## 2021-04-16 DIAGNOSIS — M6281 Muscle weakness (generalized): Secondary | ICD-10-CM

## 2021-04-16 DIAGNOSIS — R262 Difficulty in walking, not elsewhere classified: Secondary | ICD-10-CM

## 2021-04-16 DIAGNOSIS — R2681 Unsteadiness on feet: Secondary | ICD-10-CM

## 2021-04-16 NOTE — Therapy (Signed)
[facility] [address] [place], [place], [address] Phone: [phone]   Fax:  [phone]  Physical Therapy Treatment/Discharge Summary  Patient Details  Name: [name] MRN: [id number] Date of Birth: 11-Feb-1961 Referring Provider (PT): Referred by Phillips Climes, MD. PCP is Betty [name], MD   Encounter Date: 04/16/2021   PT End of Session - 04/16/21 1105     Visit Number 16    Number of Visits 17    Date for PT Re-Evaluation 04/23/21    Authorization Type BCBS    PT Start Time 1104    PT Stop Time 1138   full time not used to discharge   PT Time Calculation (min) 34 min    Equipment Utilized During Treatment Gait belt    Activity Tolerance Patient tolerated treatment well    Behavior During Therapy [facility] for tasks assessed/performed             Past Medical History:  Diagnosis Date   Abnormality of gait 05/10/2010   BACK PAIN 11/14/2008   Class 1 obesity due to excess calories with body mass index (BMI) of 31.0 to 31.9 in adult 02/07/2021   DIABETES MELLITUS, TYPE II 07/15/2008   Diplopia 07/15/2008   ECZEMA, ATOPIC 04/03/2009   Guillain-Barre ([place]) 1988   HYPERLIPIDEMIA 03/06/2009   HYPERTENSION 07/15/2008   Stroke ([place]) 2010, 2011   x2    Vertebral artery stenosis     Past Surgical History:  Procedure Laterality Date   ABDOMINAL HYSTERECTOMY     DILATION AND CURETTAGE OF UTERUS     FOOT SURGERY      There were no vitals filed for this visit.   Subjective Assessment - 04/16/21 1106     Subjective Doing well, no changes since she was last here. no falls.    Patient is accompained by: Family member   family member in the lobby   Pertinent History CVA, Vertebral Artery Stenosis, HLD, HTN, Guillain - Barre, DM Type 2, Obesity, lymphoma    Limitations Standing;Walking;House hold activities    How long can you walk comfortably? 30 minutes    Diagnostic tests Pt found to have  acute/subacute infarcts to L pons and occlusion to L vertebral artery    Patient Stated Goals Improve the strength    Currently in Pain? No/denies                [facility] PT Assessment - 04/16/21 1108       Berg Balance Test   Sit to Stand Able to stand without using hands and stabilize independently    Standing Unsupported Able to stand safely 2 minutes    Sitting with Back Unsupported but Feet Supported on Floor or Stool Able to sit safely and securely 2 minutes    Stand to Sit Sits safely with minimal use of hands    Transfers Able to transfer safely, minor use of hands    Standing Unsupported with Eyes Closed Able to stand 10 seconds safely    Standing Unsupported with Feet Together Able to place feet together independently and stand 1 minute safely    From Standing, Reach Forward with Outstretched Arm Can reach confidently >25 cm (10")    From Standing Position, Pick up Object from Floor Able to pick up shoe safely and easily    From Standing Position, Turn to Look Behind Over each Shoulder Looks behind from both sides and weight shifts well    Turn 360 Degrees Able  to turn 360 degrees safely in 4 seconds or less    Standing Unsupported, Alternately Place Feet on Step/Stool Able to stand independently and safely and complete 8 steps in 20 seconds    Standing Unsupported, One Foot in Front Able to plae foot ahead of the other independently and hold 30 seconds    Standing on One Leg Tries to lift leg/unable to hold 3 seconds but remains standing independently    Total Score 52    Berg comment: 52/56                           [facility] Adult PT Treatment/Exercise - 04/16/21 0001       Ambulation/Gait   Ambulation/Gait Yes    Ambulation/Gait Assistance 6: Modified independent (Device/Increase time)    Ambulation/Gait Assistance Details No issues ambulating outdoors.    Ambulation Distance (Feet) 1000 Feet    Assistive device Straight cane    Gait Pattern  Step-through pattern;Decreased step length - left;Decreased stance time - left;Decreased stance time - right;Decreased dorsiflexion - left    Ambulation Surface Unlevel;Outdoor;Paved              Access Code: [email or web address] URL: [email or web address] Date: 04/16/2021 Prepared by: Janann August  Reviewed final HEP. See MedBridge for further details. Pt reports performing consistently at home and has no questions.   Exercises Standing on foam eyes open - 2 x daily - 5 x weekly - 1 sets - 2 reps - 30 sec hold Romberg Stance Eyes Closed on Foam Pad - 2 x daily - 5 x weekly - 1 sets - 2 reps - 30 sec hold Walking March - 1 x daily - 5 x weekly - 2 sets - 3-5 reps Side Stepping with Counter Support - 1 x daily - 5 x weekly - 2 sets - 3-5 reps Standing Gastroc Stretch at Counter - 1 x daily - 5 x weekly - 1 sets - 3 reps - 30 seconds hold Seated Hamstring Stretch - 1 x daily - 5 x weekly - 1 sets - 3 reps - 30 seconds hold       PT Education - 04/16/21 1222     Education Details Progress towards goals, discussed in the future that if pt has worsening balance/gait/strength, then will need a new referral to return.    Person(s) Educated Patient    Methods Explanation;Demonstration    Comprehension Verbalized understanding;Returned demonstration              PT Short Term Goals - 03/22/21 1243       PT SHORT TERM GOAL #1   Title Patient will be independent with initial HEP for strength/balance (ALL STGs Due: 03/26/21)    Baseline no HEP established 11/25 PT educated pt on performing HEP at counter instead of corner since pt states she is not able perform exercises due to cluttered corners    Time 4    Period Weeks    Status On-going    Target Date 03/26/21      PT SHORT TERM GOAL #2   Title Patient will be able to ambulate >/= 500 ft on indoor surfaces w/ LRAD vs. no AD at Mod I level    Baseline supervision/CGA, 115 ft 11/21 539f w/ SPC    Time 4    Period  Weeks    Status Achieved      PT SHORT TERM GOAL #3   Title Patient will improve  gait speed to >/= 2.5 ft/sec    Baseline 2.21 ft/sec, 11/18 2.85 ft/sec no AD    Time 4    Period Weeks    Status Achieved      PT SHORT TERM GOAL #4   Title Patient will improve Berg Balance to >/= 45/56 to demo reduced fall risk and improved balance.    Baseline _0    Time 4    Period Weeks    Status Achieved               PT Long Term Goals - 04/16/21 1115       PT LONG TERM GOAL #1   Title Patient will be independent with final HEP for strength/balance and report completion >/= 5x/week for compliance upon d/c (ALL LTGs Due: 04/23/21)    Baseline met on 04/16/21    Time 8    Period Weeks    Status Achieved    Target Date 04/23/21      PT LONG TERM GOAL #2   Title Patient will improve gait speed to >/= 3.0 ft/sec to demo improved community mobility    Baseline 2.21 ft/sec; 3.42 ft/sec    Time 8    Period Weeks    Status Achieved      PT LONG TERM GOAL #3   Title Patient will improve 5x sit <> stand to </= 12 seconds to demo improved balance and reduced fall risk    Baseline 17.28 secs; 14.31 seconds    Time 8    Period Weeks    Status Not Met      PT LONG TERM GOAL #4   Title Patient will improve Berg Balance to >/= 50/56 to demo reduced fall risk and improved balance    Baseline 40/56, 52/56 on 04/16/21    Time 8    Period Weeks    Status Achieved      PT LONG TERM GOAL #5   Title Patient will be able to ambulate to >/= 1000 ft outdoors on unlevel surfaces with Mod I and LRAD    Baseline met on 04/16/21    Time 8    Period Weeks    Status Achieved            PHYSICAL THERAPY DISCHARGE SUMMARY  Visits from Start of Care: 16  Current functional level related to goals / functional outcomes: See LTGs.   Remaining deficits: Gait abnormalities, decr strength, impaired balance.   Education / Equipment: HEP.    Patient agrees to discharge. Patient  goals were met. Patient is being discharged due to meeting the stated rehab goals.         Plan - 04/16/21 1224     Clinical Impression Statement Today's skilled session focused on assessing remainder of LTGs for D/C. Pt has met 4 out of 5 LTGs. Pt improved BERG score to a 52/56 (was a 40/56 at eval), decr pt's risk of falls. Pt met goal in regards to community mobility with SPC and mod I. Pt is pleased with her progress with PT at this time and will be discharged due to meeting LTGs. Pt verbalizes understanding of continuing to perform HEP.    Personal Factors and Comorbidities Comorbidity 3+    Comorbidities CVA, Vertebral Artery Stenosis, HLD, HTN, Guillain - Barre, DM Type 2, Obesity, lymphoma    Examination-Activity Limitations Stairs;Stand;Locomotion Level;Bend;Squat    Examination-Participation Restrictions Occupation;Cleaning;Driving;Community Activity    Stability/Clinical Decision Making Evolving/Moderate complexity  Rehab Potential Good    PT Frequency 2x / week    PT Duration 8 weeks    PT Treatment/Interventions ADLs/Self Care Home Management;Aquatic Therapy;Canalith Repostioning;Biofeedback;Moist Heat;Electrical Stimulation;Cryotherapy;DME Instruction;Gait training;Stair training;Functional mobility training;Therapeutic activities;Therapeutic exercise;Balance training;Neuromuscular re-education;Patient/family education;Orthotic Fit/Training;Manual techniques;Passive range of motion;Vestibular    PT Next Visit Plan D/C    Consulted and Agree with Plan of Care Patient             Patient will benefit from skilled therapeutic intervention in order to improve the following deficits and impairments:  Abnormal gait, Decreased balance, Decreased endurance, Difficulty walking, Dizziness, Impaired sensation, Decreased range of motion, Decreased activity tolerance, Decreased coordination, Decreased knowledge of use of DME, Decreased strength  Visit Diagnosis: Unsteadiness on  feet  Difficulty in walking, not elsewhere classified  Muscle weakness (generalized)     Problem List Patient Active Problem List   Diagnosis Date Noted   Peripheral arterial disease ([place]) 04/13/2021   Atherosclerosis of aorta ([place]) 02/14/2021   Cerebral thrombosis with cerebral infarction 02/08/2021   Abnormal MRI of head 02/07/2021   Vertigo 02/07/2021   Class 1 obesity due to excess calories with body mass index (BMI) of 31.0 to 31.9 in adult 02/07/2021   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   Trigger finger, left ring finger 10/04/2018   Alternating constipation and diarrhea 04/20/2018   Diabetic peripheral neuropathy associated with type 2 diabetes mellitus ([place]) 02/26/2018   DM (diabetes mellitus), type 2 with peripheral vascular complications ([place]) 69/67/8938   ABNORMALITY OF GAIT 05/10/2010   ECZEMA, ATOPIC 04/03/2009   Hyperlipidemia associated with type 2 diabetes mellitus ([place]) 03/06/2009   BACK PAIN 11/14/2008   Cerebrovascular disease 08/05/2008   DIPLOPIA 07/15/2008   Essential hypertension 07/15/2008    Arliss Journey, PT, DPT  04/16/2021, 12:30 PM  [facility] [address] [place], [place], [address] Phone: [phone]   Fax:  [phone]  Name: [name] MRN: [id number] Date of Birth: [birth year]

## 2021-04-29 NOTE — Progress Notes (Signed)
HPI: [name] is a 60 y.o. female, who is here today to follow on recent visit. Since her last visit she has seen cardiologist and neurologist.  PAD: She also established with vascular, Dr [name], started on Cilostazole 50 mg bid. Still on Plavix 75 mg daily and she is on Atorvastatin 80 mg daily. We were planning on checking FLP today but she already ate today.  PT has helped with balance. Vertigo/dizziness has greatly improved. She is taking Meclizine 25 mg daily as needed. No new associated symptoms.  According to pt, PT recommended to keep using her cane. No falls. She is not driving.  She is planning on applying for disability and retired in 06/2021. She also applied for medicaid.  Hypertension:  Medications:Amlodipine 10 mg daily, Lisinopril-HCTZ 20-12.5 mg 2 tabs daily, Hydralazine 50 mg tid,and Metoprolol succinate 50 mg daily. BP readings at home:Most of the time < =140/80, occasionally she has had SBP 160's,usually with high salt intake. Side effects:None.  Negative for unusual or severe headache, visual changes, exertional chest pain, dyspnea, new focal weakness, or edema.  Lab Results  Component Value Date   CREATININE 1.15 (H) 03/08/2021   BUN 30 (H) 03/08/2021   NA 138 03/08/2021   K 4.0 03/08/2021   CL 106 03/08/2021   CO2 25 03/08/2021   She applied for disability, has appt in 05/2021 and for early retirement, 06/2021  Throat feels "funny", from throat to epigastrium,retrosternal. Negative for dysphagia and heartburn. Problem happens usually when she is eating. No heartburn. She is on Omeprazole 20 mg daily.  Review of Systems  Constitutional:  Positive for fatigue. Negative for appetite change and fever.  HENT:  Negative for mouth sores, nosebleeds and sore throat.   Respiratory:  Negative for cough and wheezing.   Gastrointestinal:  Negative for abdominal pain, nausea and vomiting.       Negative for changes in bowel habits.  Genitourinary:   Negative for decreased urine volume and hematuria.  Musculoskeletal:  Positive for gait problem. Negative for myalgias.  Neurological:  Negative for syncope and facial asymmetry.  Psychiatric/Behavioral:  Negative for confusion.   Rest see pertinent positives and negatives per HPI.  Current Outpatient Medications on File Prior to Visit  Medication Sig Dispense Refill   acetaminophen (TYLENOL) 500 MG tablet Take 2 tablets (1,000 mg total) by mouth every 6 (six) hours as needed. 30 tablet 0   amLODipine (NORVASC) 10 MG tablet Take 1 tablet by mouth once daily (Patient taking differently: Take 10 mg by mouth daily.) 30 tablet 3   aspirin EC 81 MG tablet Take 1 tablet (81 mg total) by mouth daily. Swallow whole. 90 tablet 0   atorvastatin (LIPITOR) 80 MG tablet Take 1 tablet (80 mg total) by mouth daily. 30 tablet 0   Blood Pressure Monitoring (BLOOD PRESSURE MONITOR AUTOMAT) DEVI 1 Device by Does not apply route daily. 1 Device 0   cilostazol (PLETAL) 50 MG tablet Take 1 tablet (50 mg total) by mouth 2 (two) times daily. 180 tablet 1   clobetasol ointment (TEMOVATE) 0.05 % Apply topically at bedtime.     clopidogrel (PLAVIX) 75 MG tablet Take 1 tablet (75 mg total) by mouth daily. 90 tablet 3   Continuous Blood Gluc Sensor (FREESTYLE LIBRE 2 SENSOR) MISC 1 Device by Does not apply route every 14 (fourteen) days. 6 each 3   FLUoxetine (PROZAC) 20 MG capsule Take 1 capsule (20 mg total) by mouth daily. 90 capsule 2  fluticasone (FLONASE) 50 MCG/ACT nasal spray Place 1 spray into both nostrils 2 (two) times daily. 16 g 3   glucose blood (CONTOUR NEXT TEST) test strip 1 each by Other route 2 (two) times daily. And lancets 2/day 200 each 3   glucose blood (ONETOUCH VERIO) test strip USE TO CHECK BLOOD SUGAR TWICE A DAY AND PRN 100 each 6   hydrALAZINE (APRESOLINE) 50 MG tablet Take 1 tablet (50 mg total) by mouth 3 (three) times daily. 90 tablet 1   insulin glargine (LANTUS) 100 UNIT/ML Solostar Pen  Inject 160 Units into the skin every morning. And pen needles 1/day 165 mL 3   lisinopril-hydrochlorothiazide (ZESTORETIC) 20-12.5 MG tablet Take 2 tablets by mouth once daily (Patient taking differently: Take 2 tablets by mouth daily.) 60 tablet 3   meclizine (ANTIVERT) 12.5 MG tablet Take 1 tablet (12.5 mg total) by mouth 2 (two) times daily as needed for dizziness. 60 tablet 1   metoprolol succinate (TOPROL-XL) 50 MG 24 hr tablet TAKE 1 TABLET BY MOUTH ONCE DAILY WITH  OR  IMMEDIATELY  FOLLOWING  A  MEAL 30 tablet 0   ONE TOUCH LANCETS MISC USE TO CHECK BLOOD SUGAR TWICE A DAY AND PRN 100 each 6   triamcinolone ointment (KENALOG) 0.5 % Apply topically 2 (two) times daily as needed. 30 g 1   topiramate (TOPAMAX) 25 MG tablet Take 1 tablet (25 mg total) by mouth 2 (two) times daily. 60 tablet 0   No current facility-administered medications on file prior to visit.    Past Medical History:  Diagnosis Date   Abnormality of gait 05/10/2010   BACK PAIN 11/14/2008   Class 1 obesity due to excess calories with body mass index (BMI) of 31.0 to 31.9 in adult 02/07/2021   DIABETES MELLITUS, TYPE II 07/15/2008   Diplopia 07/15/2008   ECZEMA, ATOPIC 04/03/2009   Guillain-Barre ([place]) 1988   HYPERLIPIDEMIA 03/06/2009   HYPERTENSION 07/15/2008   Stroke ([place]) 2010, 2011   x2    Vertebral artery stenosis    No Known Allergies  Social History   Socioeconomic History   Marital status: Single    Spouse name: Not on file   Number of children: 0   Years of education: Not on file   Highest education level: Not on file  Occupational History   Occupation: custodian at [employer]	[place]  Tobacco Use   Smoking status: Never   Smokeless tobacco: Never  Vaping Use   Vaping Use: Never used  Substance and Sexual Activity   Alcohol use: No   Drug use: No   Sexual activity: Not on file  Other Topics Concern   Not on file  Social History Narrative   Right Handed    Lives in a one story home     Social Determinants of Health   Financial Resource Strain: Not on file  Food Insecurity: Not on file  Transportation Needs: Not on file  Physical Activity: Not on file  Stress: Not on file  Social Connections: Not on file   Vitals:   04/30/21 0944  BP: 140/80  Pulse: 89  Resp: 16  SpO2: 98%   Body mass index is 32.68 kg/m.  Physical Exam Vitals and nursing note reviewed.  Constitutional:      General: She is not in acute distress.    Appearance: She is well-developed.  HENT:     Head: Normocephalic and atraumatic.     Mouth/Throat:     Mouth: Mucous  membranes are moist.     Pharynx: Oropharynx is clear.  Eyes:     Conjunctiva/sclera: Conjunctivae normal.  Cardiovascular:     Rate and Rhythm: Normal rate and regular rhythm.     Heart sounds: No murmur heard. Pulmonary:     Effort: Pulmonary effort is normal. No respiratory distress.     Breath sounds: Normal breath sounds.  Abdominal:     Palpations: Abdomen is soft. There is no hepatomegaly or mass.     Tenderness: There is no abdominal tenderness.  Skin:    General: Skin is warm.     Findings: No erythema or rash.  Neurological:     General: No focal deficit present.     Mental Status: She is alert and oriented to person, place, and time.     Comments: Mildly unstable gait assisted with a cane.   ASSESSMENT AND PLAN:  [name] was seen today for follow-up.  Diagnoses and all orders for this visit:  Hyperlipidemia associated with type 2 diabetes mellitus ([place]) LDL goal < 70. She is not fasting today, so she will have FLP done at Dr Nelly Laurence office. Continue Atorvastatin 80 mg daily. Low fat diet recommended as well.   GERD (gastroesophageal reflux disease) Retrosternal "funny" sensation can be caused by this problem. Recommend increasing dose of Omeprazole from 20 mg to 20 mg bid. GERD precautions also recommended. Instructed about warning signs.  Essential hypertension SBP mildly elevated  today, reporting most BP's at home <= 140/80. Continue Amlodipine 10 mg daily, Lisinopril-HCTZ 20-12.5 mg 2 tabs daily, Hydralazine 50 mg tid,and Metoprolol succinate 50 mg daily. Low salt diet recommended. Continue monitoring BP regularly.  Vertigo Problem has improved. Fall precautions discussed. Continue Meclizine 25 mg daily prn. FMLA will be extended until 05/31/2021.  Return in about 5 months (around 09/28/2021).  Siana Panameno G. [name], MD  [facility]. [place] office.

## 2021-04-30 ENCOUNTER — Telehealth: Payer: Self-pay | Admitting: Family Medicine

## 2021-04-30 ENCOUNTER — Encounter: Payer: Self-pay | Admitting: Family Medicine

## 2021-04-30 ENCOUNTER — Ambulatory Visit (INDEPENDENT_AMBULATORY_CARE_PROVIDER_SITE_OTHER): Payer: BC Managed Care – PPO | Admitting: Family Medicine

## 2021-04-30 VITALS — BP 140/80 | HR 89 | Resp 16 | Ht 64.0 in | Wt 190.4 lb

## 2021-04-30 DIAGNOSIS — K219 Gastro-esophageal reflux disease without esophagitis: Secondary | ICD-10-CM | POA: Diagnosis not present

## 2021-04-30 DIAGNOSIS — I10 Essential (primary) hypertension: Secondary | ICD-10-CM | POA: Diagnosis not present

## 2021-04-30 DIAGNOSIS — E785 Hyperlipidemia, unspecified: Secondary | ICD-10-CM

## 2021-04-30 DIAGNOSIS — R42 Dizziness and giddiness: Secondary | ICD-10-CM | POA: Diagnosis not present

## 2021-04-30 DIAGNOSIS — E1169 Type 2 diabetes mellitus with other specified complication: Secondary | ICD-10-CM | POA: Diagnosis not present

## 2021-04-30 MED ORDER — OMEPRAZOLE 20 MG PO CPDR: DELAYED_RELEASE_CAPSULE | ORAL | 1 refills | Status: DC

## 2021-04-30 NOTE — Assessment & Plan Note (Signed)
LDL goal < 70. She is not fasting today, so she will have FLP done at Dr Nelly Laurence office. Continue Atorvastatin 80 mg daily. Low fat diet recommended as well.

## 2021-04-30 NOTE — Telephone Encounter (Signed)
Patient came to drop off paperwork for a parking placard. Paperwork was filled out by patient and was placed in folder.     Please advise

## 2021-04-30 NOTE — Assessment & Plan Note (Signed)
Retrosternal "funny" sensation can be caused by this problem. Recommend increasing dose of Omeprazole from 20 mg to 20 mg bid. GERD precautions also recommended. Instructed about warning signs.

## 2021-04-30 NOTE — Assessment & Plan Note (Addendum)
SBP mildly elevated today, reporting most BP's at home <= 140/80. Continue Amlodipine 10 mg daily, Lisinopril-HCTZ 20-12.5 mg 2 tabs daily, Hydralazine 50 mg tid,and Metoprolol succinate 50 mg daily. Low salt diet recommended. Continue monitoring BP regularly. 

## 2021-04-30 NOTE — Telephone Encounter (Signed)
Form on pcp's desk to be signed.  

## 2021-04-30 NOTE — Patient Instructions (Addendum)
A few things to remember from today's visit:  Essential hypertension  Vertigo  Hyperlipidemia associated with type 2 diabetes mellitus ([place])  If you need refills please call your pharmacy. Do not use My Chart to request refills or for acute issues that need immediate attention.   Please be sure medication list is accurate. If a new problem present, please set up appointment sooner than planned today. No changes today. Continue monitoring blood pressure, ideally it should be under 140/90. Because you are not fastin today cholesterol can be done at your cardiologist's office.  Have FMLA fax to my office. Keep appts with cardio and vascular and be sure to follow with endocrinologist.

## 2021-04-30 NOTE — Assessment & Plan Note (Signed)
Problem has improved. Fall precautions discussed. Continue Meclizine 25 mg daily prn. FMLA will be extended until 05/31/2021.

## 2021-05-04 MED ORDER — INSULIN GLARGINE 100 UNIT/ML SOLOSTAR PEN: 160.0000 [IU] | PEN_INJECTOR | SUBCUTANEOUS | 3 refills | Status: DC

## 2021-05-04 NOTE — Telephone Encounter (Signed)
I spoke with patient, she requested for form to be mailed to her home address. Form placed in the mail. Pt also requested renewal on her Lantus program assistance, advised pt I would take care of it.

## 2021-05-04 NOTE — Telephone Encounter (Signed)
Rx on pcp's desk for signature.

## 2021-05-04 NOTE — Telephone Encounter (Signed)
Rx faxed with confirmation.

## 2021-05-04 NOTE — Addendum Note (Signed)
Addended by: Rodrigo Ran on: 05/04/2021 09:14 AM   Modules accepted: Orders

## 2021-05-12 ENCOUNTER — Telehealth: Payer: Self-pay

## 2021-05-12 NOTE — Telephone Encounter (Signed)
I called and notified pt that her Lantus came in today. She verbalized understanding.

## 2021-05-18 ENCOUNTER — Telehealth: Payer: Self-pay | Admitting: Family Medicine

## 2021-05-18 NOTE — Telephone Encounter (Signed)
Patient would like to know if it is okay to take phillips magnesia for her indigestion and constipation

## 2021-05-18 NOTE — Telephone Encounter (Signed)
Patient called because Dr.Jordan had taken patient out of work on sick leave and then extended it to the end of January. Patient states that a letter is needed providing proof of this sent to her employer, [employer]   Patient also would like to know if it is okay to take phillips magnesia for her indigestion and constipation     Fax number for employer is [phone]  Good callback for patient is [phone]    Please advise

## 2021-05-19 NOTE — Telephone Encounter (Signed)
If She is still having symptoms after increasing Omeprazole 20 mg to bid, we can change to Protonix 40 mg daily. For constipation she can try Miralax at night daily as needed. Thanks, BJ

## 2021-05-21 NOTE — Telephone Encounter (Signed)
I spoke with patient, she will let us know if the Miralax doesn't help.

## 2021-05-24 ENCOUNTER — Telehealth (INDEPENDENT_AMBULATORY_CARE_PROVIDER_SITE_OTHER): Payer: BC Managed Care – PPO | Admitting: Family Medicine

## 2021-05-24 ENCOUNTER — Encounter: Payer: Self-pay | Admitting: Family Medicine

## 2021-05-24 VITALS — Ht 64.0 in

## 2021-05-24 DIAGNOSIS — R42 Dizziness and giddiness: Secondary | ICD-10-CM

## 2021-05-24 NOTE — Progress Notes (Signed)
Virtual Visit via Video Note I connected with [name] on 05/24/21 by telephone and verified that I am speaking with the correct person using two identifiers.  Location patient: home Location provider:work office Persons participating in the virtual visit: patient, provider  I discussed the limitations of evaluation and management by telemedicine and the availability of in person appointments. The patient expressed understanding and agreed to proceed.  Chief Complaint  Patient presents with   Dizziness   HPI: [name] is a 61 yo female with hx of CVA,DM II,HTN,HLD,and allergies c/o episode of dizziness this morning while she was walking. Spinning sensation, felt like "everything was going left." She had another episode this weekend, Saturday, felt spinning sensation for 2-3 min and her eyes moving, "going in circles." She has had similar episodes intermittently for a while. It does not happen when she moves in bed. Exacerbated by rapid movement, getting up. Alleviated by siting down and being still for a few minutes.  She denies new associated symptoms. Negative for associated fever, chills, headache, visual changes, hearing loss, tinnitus, CP, palpitation, dyspnea, or vomiting. No new focal weakness/deficit. Sometimes it is associated with mild nausea. She has been prescribed meclizine, which does not seem to help.  She has been evaluated by ENT, 01/21/21,determined it was not vertigo. CVA, last brain MR done on 02/07/21: Two acute/early subacute infarcts within the left pons measuring up to 8 mm. Small chronic lacunar infarct within the left cerebellar hemisphere.  She follows with neuro, last visit on 03/24/21. She has checked BS's during episodes and 91 (Saturday) 198 today. She has not checked BP. She is on atorvastatin 80 mg daily,plavix 75 mg daily, and Aspirin 81 mg daily. States that today she filed for disability,  She works as a Sports coach, but she does not feel  like she is able to return to work due to duties requiring her to climb ladders. States that she cannot have restrictions at work. She is thinking about getting another job.  PT has helped some.  ROS: See pertinent positives and negatives per HPI.  Past Medical History:  Diagnosis Date   Abnormality of gait 05/10/2010   BACK PAIN 11/14/2008   Class 1 obesity due to excess calories with body mass index (BMI) of 31.0 to 31.9 in adult 02/07/2021   DIABETES MELLITUS, TYPE II 07/15/2008   Diplopia 07/15/2008   ECZEMA, ATOPIC 04/03/2009   Guillain-Barre ([place]) 1988   HYPERLIPIDEMIA 03/06/2009   HYPERTENSION 07/15/2008   Stroke ([place]) 2010, 2011   x2    Vertebral artery stenosis    Past Surgical History:  Procedure Laterality Date   ABDOMINAL HYSTERECTOMY     DILATION AND CURETTAGE OF UTERUS     FOOT SURGERY     Family History  Problem Relation Age of Onset   Diabetes Sister    Asthma Other    Stroke Other    Hypertension Other    Diabetes Mother    Stroke Mother    Cancer Father        pt states hae had some kind of stomach cancer, ? stomach or colon   Breast cancer Neg Hx     Social History   Socioeconomic History   Marital status: Single    Spouse name: Not on file   Number of children: 0   Years of education: Not on file   Highest education level: Not on file  Occupational History   Occupation: custodian at [employer]	[place]  Tobacco Use  Smoking status: Never   Smokeless tobacco: Never  Vaping Use   Vaping Use: Never used  Substance and Sexual Activity   Alcohol use: No   Drug use: No   Sexual activity: Not on file  Other Topics Concern   Not on file  Social History Narrative   Right Handed    Lives in a one story home    Social Determinants of Health   Financial Resource Strain: Not on file  Food Insecurity: Not on file  Transportation Needs: Not on file  Physical Activity: Not on file  Stress: Not on file  Social Connections: Not on file   Intimate Partner Violence: Not on file    Current Outpatient Medications:    acetaminophen (TYLENOL) 500 MG tablet, Take 2 tablets (1,000 mg total) by mouth every 6 (six) hours as needed., Disp: 30 tablet, Rfl: 0   amLODipine (NORVASC) 10 MG tablet, Take 1 tablet by mouth once daily (Patient taking differently: Take 10 mg by mouth daily.), Disp: 30 tablet, Rfl: 3   atorvastatin (LIPITOR) 80 MG tablet, Take 1 tablet (80 mg total) by mouth daily., Disp: 30 tablet, Rfl: 0   Blood Pressure Monitoring (BLOOD PRESSURE MONITOR AUTOMAT) DEVI, 1 Device by Does not apply route daily., Disp: 1 Device, Rfl: 0   cilostazol (PLETAL) 50 MG tablet, Take 1 tablet (50 mg total) by mouth 2 (two) times daily., Disp: 180 tablet, Rfl: 1   clobetasol ointment (TEMOVATE) 0.05 %, Apply topically at bedtime., Disp: , Rfl:    Continuous Blood Gluc Sensor (FREESTYLE LIBRE 2 SENSOR) MISC, 1 Device by Does not apply route every 14 (fourteen) days., Disp: 6 each, Rfl: 3   FLUoxetine (PROZAC) 20 MG capsule, Take 1 capsule (20 mg total) by mouth daily., Disp: 90 capsule, Rfl: 2   fluticasone (FLONASE) 50 MCG/ACT nasal spray, Place 1 spray into both nostrils 2 (two) times daily., Disp: 16 g, Rfl: 3   glucose blood (CONTOUR NEXT TEST) test strip, 1 each by Other route 2 (two) times daily. And lancets 2/day, Disp: 200 each, Rfl: 3   glucose blood (ONETOUCH VERIO) test strip, USE TO CHECK BLOOD SUGAR TWICE A DAY AND PRN, Disp: 100 each, Rfl: 6   hydrALAZINE (APRESOLINE) 50 MG tablet, Take 1 tablet (50 mg total) by mouth 3 (three) times daily., Disp: 90 tablet, Rfl: 1   insulin glargine (LANTUS) 100 UNIT/ML Solostar Pen, Inject 160 Units into the skin every morning. And pen needles 1/day, Disp: 165 mL, Rfl: 3   lisinopril-hydrochlorothiazide (ZESTORETIC) 20-12.5 MG tablet, Take 2 tablets by mouth once daily (Patient taking differently: Take 2 tablets by mouth daily.), Disp: 60 tablet, Rfl: 3   metoprolol succinate (TOPROL-XL) 50 MG  24 hr tablet, TAKE 1 TABLET BY MOUTH ONCE DAILY WITH  OR  IMMEDIATELY  FOLLOWING  A  MEAL, Disp: 30 tablet, Rfl: 0   omeprazole (PRILOSEC) 20 MG capsule, 1 cap 30 min before breakfast and 3-4 hours after supper, before going to bed., Disp: 180 capsule, Rfl: 1   ONE TOUCH LANCETS MISC, USE TO CHECK BLOOD SUGAR TWICE A DAY AND PRN, Disp: 100 each, Rfl: 6   triamcinolone ointment (KENALOG) 0.5 %, Apply topically 2 (two) times daily as needed., Disp: 30 g, Rfl: 1   topiramate (TOPAMAX) 25 MG tablet, Take 1 tablet (25 mg total) by mouth 2 (two) times daily., Disp: 60 tablet, Rfl: 0  EXAM:  VITALS per patient if applicable:Ht 5\' 4"  (1.626 m)  BMI 32.68 kg/m  GENERAL: alert, oriented, appears well and in no acute distress LUNGS: on the phone no signs of respiratory distress, breathing rate appears normal, no obvious gross SOB, gasping or wheezing,or cough. PSYCH/NEURO: pleasant and cooperative, no obvious depression or anxiety, speech and thought processing grossly intact  ASSESSMENT AND PLAN:  Discussed the following assessment and plan:  Dizziness Chronic. We reviewed possible etiologies. Meclizine is not helping, so discontinue. Most likely problem is sequela from prior CVA. Educated about the importance of aggressive risk factors management to prevent further complications.  Continue atorvastatin 80 mg daily and Aspirin 81 mg daily.  She has completed 3 months of Plavix. She has applied for disability, so I will be completing forms. Fall prevention discussed. Clearly instructed about warning signs.  We discussed possible serious and likely etiologies, options for evaluation and workup, limitations of telemedicine visit vs in person visit, treatment, treatment risks and precautions. The patient was advised to call back or seek an in-person evaluation if the symptoms worsen or if the condition fails to improve as anticipated. I discussed the assessment and treatment plan with the patient.  The patient was provided an opportunity to ask questions and all were answered. The patient agreed with the plan and demonstrated an understanding of the instructions.  Non face to face time spent on this visit: 17 min.  Return if symptoms worsen or fail to improve, for Keep next appt..  [name]. [name], MD  [facility]. [place] office.

## 2021-05-31 ENCOUNTER — Telehealth: Payer: Self-pay | Admitting: Family Medicine

## 2021-05-31 NOTE — Telephone Encounter (Signed)
Form filled out & placed on pcp's desk for signature.

## 2021-05-31 NOTE — Telephone Encounter (Signed)
Physician information forms for Disability to be filled out--placed in dr's folder.  Call [phone] upon completion.

## 2021-06-01 ENCOUNTER — Telehealth (INDEPENDENT_AMBULATORY_CARE_PROVIDER_SITE_OTHER): Payer: Self-pay | Admitting: Family Medicine

## 2021-06-01 ENCOUNTER — Encounter: Payer: Self-pay | Admitting: Family Medicine

## 2021-06-01 VITALS — Ht 64.0 in

## 2021-06-01 DIAGNOSIS — I679 Cerebrovascular disease, unspecified: Secondary | ICD-10-CM

## 2021-06-01 DIAGNOSIS — R42 Dizziness and giddiness: Secondary | ICD-10-CM

## 2021-06-01 LAB — HM DIABETES EYE EXAM

## 2021-06-01 NOTE — Progress Notes (Signed)
Virtual Visit via Telephone Note I connected with [name] on 06/01/21 at  4:30 PM EST by telephone and verified that I am speaking with the correct person using two identifiers.   I discussed the limitations, risks, security and privacy concerns of performing an evaluation and management service by telephone and the availability of in person appointments. I also discussed with the patient that there may be a patient responsible charge related to this service. The patient expressed understanding and agreed to proceed.  Location patient: home Location provider: work office Participants present for the call: patient, provider Patient did not have a visit in the prior 7 days to address this/these issue(s).  Chief Complaint  Patient presents with   discuss medical leave   History of Present Illness: [name] is a 61 y.o.female with hx of DM II, hypertension, hyperlipidemia, PAD, CVA requesting completion of shot disability forms. She has already applied for disability but according to pt, her employer requested extension of FMLA from 07/31/2021 to 03/01/22 while the her disability process is completed. CVA with residual dizziness and unstable gait. 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  She follows with neurologist and has seen ENT. Currently she is on atorvastatin 80 mg daily and Aspirin 81 mg daily.  Last visit on 05/24/21. Today she had her eye exam, reporting mild retinopathy and cataract. She does not have new concerns.  Observations/Objective: Patient sounds cheerful and well on the phone. I do not appreciate any SOB. Speech and thought processing are grossly intact. Patient reported vitals:Ht 5\' 4"  (1.626 m)    BMI 32.68 kg/m   Assessment and Plan:  1. Dizziness Chronic and stable. Continue fall  precautions.  2. Cerebrovascular disease We discussed the importance of intensive management of CV risk factors for secondary prevention. Continue adequate BP and glucose control. Currently on atorvastatin 80 mg daily and Aspirin 81 mg daily. Following with neurologist.  Short disability paperwork completed, will cover from 07/31/2021 to 03/01/2022.  Follow Up Instructions:  Return if symptoms worsen or fail to improve, for Keep next appt..  I did not refer this patient for an OV in the next 24 hours for this/these issue(s).  I discussed the assessment and treatment plan with the patient. The patient was provided an opportunity to ask questions and all were answered. The patient agreed with the plan and demonstrated an understanding of the instructions.   I provided  7 minutes of non-face-to-face time during this encounter.  Ashon Rosenberg G. [name], MD  [facility]. [place] office.

## 2021-06-02 ENCOUNTER — Encounter: Payer: Self-pay | Admitting: Family Medicine

## 2021-06-02 NOTE — Telephone Encounter (Signed)
Pt is aware paperwork is completed & up front for pick up. Copy sent to scan.

## 2021-06-08 ENCOUNTER — Ambulatory Visit: Payer: BC Managed Care – PPO | Admitting: Internal Medicine

## 2021-06-10 ENCOUNTER — Telehealth: Payer: Self-pay | Admitting: Neurology

## 2021-06-10 NOTE — Telephone Encounter (Signed)
Letter completed.

## 2021-06-10 NOTE — Telephone Encounter (Signed)
Called patient and she stated that she needs the letter directed to [employer]. She asked if it could be completed by the 15 th. I informed patient I would let Dr. Posey Pronto know that she needs it by then to receive assistance for her bills.  Also informed patient that due to patient privacy we do not disclose the medical condition and instead document "due to medical illness." Patient verbalized understanding and had no further questions or concerns and is aware I will contact her once letter is completed.

## 2021-06-10 NOTE — Telephone Encounter (Signed)
Patient called stating that she needs a letter from her Dr stating that she had a stroke and that is why she has been out of work.  She is trying to receive assistance for some of her bills since she has not been working. She stated that her assistance appointment is on Feb 15.

## 2021-06-10 NOTE — Telephone Encounter (Signed)
Please find out who the letter is directed to and also let her know that for patient privacy, we typically do not disclose her medical condition (stroke), and instead document "due to medical illness",etc.

## 2021-06-11 NOTE — Telephone Encounter (Signed)
Called patient and informed her that letter is completed and ready for pick up. Patient is aware it is ready for pick up at front desk.

## 2021-06-29 ENCOUNTER — Telehealth: Payer: Self-pay | Admitting: Family Medicine

## 2021-06-29 NOTE — Telephone Encounter (Signed)
Form on providers desk to be signed

## 2021-06-29 NOTE — Telephone Encounter (Signed)
Short Term Disability form to be filed out--placed in dr's folder.  Call [phone] upon completion.

## 2021-06-30 NOTE — Telephone Encounter (Signed)
Pt is aware form is ready for pick up °

## 2021-06-30 NOTE — Telephone Encounter (Signed)
I left patient a voicemail letting her know that the form is completed & up front for pick up. ?

## 2021-07-09 ENCOUNTER — Telehealth: Payer: Self-pay | Admitting: Family Medicine

## 2021-07-09 DIAGNOSIS — L989 Disorder of the skin and subcutaneous tissue, unspecified: Secondary | ICD-10-CM

## 2021-07-09 NOTE — Telephone Encounter (Signed)
Pt is calling and dr Allyson Sabal office not in network with [employer] and needs a new referral to [facility] dermatologist fax number [phone] phone number [phone]. Pt has a spot on her leg ?

## 2021-07-09 NOTE — Telephone Encounter (Signed)
Referral updated

## 2021-07-13 ENCOUNTER — Ambulatory Visit (INDEPENDENT_AMBULATORY_CARE_PROVIDER_SITE_OTHER): Payer: 59 | Admitting: Cardiovascular Disease

## 2021-07-13 ENCOUNTER — Other Ambulatory Visit: Payer: Self-pay

## 2021-07-13 ENCOUNTER — Encounter: Payer: Self-pay | Admitting: Cardiovascular Disease

## 2021-07-13 VITALS — BP 160/84 | HR 81 | Ht 64.0 in | Wt 197.0 lb

## 2021-07-13 DIAGNOSIS — I739 Peripheral vascular disease, unspecified: Secondary | ICD-10-CM

## 2021-07-13 NOTE — Progress Notes (Signed)
[name] returns today for follow-up.  I saw her 04/13/2021 at the request of Dr. Harl Bowie for "PAD"/claudication.  She does have high-frequency signals in both SFAs with a left ABI of 0.82.  I began her on Pletal 50 mg p.o. twice daily.  She currently denies claudication symptoms.  At this point, we will treat her conservatively.  We will get lower extremity arterial Doppler studies this coming November.  I will see her back in 1 year.  If she has worsening symptoms I am happy to perform angiography and endovascular therapy. ? ?Lorretta Harp, M.D., [place], [facility] [place], [place] ?[address] ?[place], [place]  [address]  ?[phone] ?07/13/2021 ?11:11 AM  ?

## 2021-07-13 NOTE — Patient Instructions (Signed)
Medication Instructions:  ?Your physician recommends that you continue on your current medications as directed. Please refer to the Current Medication list given to you today. ? ?*If you need a refill on your cardiac medications before your next appointment, please call your pharmacy* ? ? ?Testing/Procedures: ?Your physician has requested that you have a lower extremity arterial duplex. This test is an ultrasound of the arteries in the legs. It looks at arterial blood flow in the legs. Allow one hour for Lower Arterial scans. There are no restrictions or special instructions  ? ?Your physician has requested that you have an ankle brachial index (ABI). During this test an ultrasound and blood pressure cuff are used to evaluate the arteries that supply the arms and legs with blood. Allow thirty minutes for this exam. There are no restrictions or special instructions. ?To be done in November. These procedures are done at [place]. [address] ? ? ?Follow-Up: ?At [facility], you and your health needs are our priority.  As part of our continuing mission to provide you with exceptional heart care, we have created designated Provider Care Teams.  These Care Teams include your primary Cardiologist (physician) and Advanced Practice Providers (APPs -  Physician Assistants and Nurse Practitioners) who all work together to provide you with the care you need, when you need it. ? ?We recommend signing up for the patient portal called "MyChart".  Sign up information is provided on this After Visit Summary.  MyChart is used to connect with patients for Virtual Visits (Telemedicine).  Patients are able to view lab/test results, encounter notes, upcoming appointments, etc.  Non-urgent messages can be sent to your provider as well.   ?To learn more about what you can do with MyChart, go to [email or web address].   ? ?Your next appointment:   ?12 month(s) ? ?The format for your next appointment:   ?In Person ? ?Provider:    ?Quay Burow, MD  ?

## 2021-07-23 ENCOUNTER — Encounter: Payer: Self-pay | Admitting: Neurology

## 2021-07-23 ENCOUNTER — Ambulatory Visit: Payer: 59 | Admitting: Neurology

## 2021-07-23 ENCOUNTER — Other Ambulatory Visit: Payer: Self-pay | Admitting: Family Medicine

## 2021-07-23 ENCOUNTER — Other Ambulatory Visit: Payer: Self-pay

## 2021-07-23 VITALS — BP 184/92 | HR 75 | Ht 64.0 in | Wt 192.0 lb

## 2021-07-23 DIAGNOSIS — I639 Cerebral infarction, unspecified: Secondary | ICD-10-CM | POA: Diagnosis not present

## 2021-07-23 DIAGNOSIS — I10 Essential (primary) hypertension: Secondary | ICD-10-CM

## 2021-07-23 DIAGNOSIS — I679 Cerebrovascular disease, unspecified: Secondary | ICD-10-CM

## 2021-07-23 NOTE — Progress Notes (Signed)
? ? ?Follow-up Visit ? ? ?Date: 07/23/21 ? ? ?[name] ?MRN: [id number] ?DOB: 1960-07-11 ? ? ?Interim History: ?[name] is a 61 y.o. right-handed African American female with uncontrolled diabetes mellitus, Non-Hodgkin's lymphoma, hypertension, history of Guillain-Barre syndrome (1988 treated at [facility], recovered), prior stroke (2010), and neuropathy returning to the clinic for follow-up of left pontine stroke (01/2021).  The patient was accompanied to the clinic by self. ? ?History of present illness: ?In early [place] began having sudden onset of dizziness and was initially seen by ENT who ordered out-patient MRI brain and showed possible subacute punctate infarct in the left pons.  Over the next three days, she beganhaving double vision and left arm and leg numbness/tingling which prompted her to go to [facility] ER. She was on plavix at home. Repeat MRI brain showed two early acute infracts in the left pons and small chronic cerebellar infarcts. MRA showed right V4 occlusion, near occlusion of the left V4, as well we progressive atherosclerosis disease involving the intracranial vessels with severe M1 and M2 stenosis bilaterally.   She was started on aspirin '81mg'$  and continued with plavix '75mg'$  daily. Her LDL was elevated at 169, so lipitor was increased to '80mg'$  daily.  HbA1c very elevated 13.9.  She was discharged home and going to out-patient PT. She continues to have dizzy spells which can occur suddenly and last 30 minutes. She has started to use a cane because of her associated imbalance. Her left side remains numbness, but is hopefully PT will help.   ?  ?She works as a Sports coach, but unable to return to work due to duties requiring her to climb ladders, which she is no longer advised to do. She is concerned about her future job outlook and being able to meet her financial obligations. ?  ?UPDATE 07/23/2021:  She is here for 19-monthstroke follow-up.  She feels that there is mild improvement  in dizziness and left sided sensory loss.  She has less frequent spells of dizziness.  No change in balance, she continues to use a cane.  She is no longer working.  She is now on aspirin '81mg'$  daily and stopped plavix after three months.  No new complaints today.  BP is very high today, she denies any symptoms of discomfort and attributes this to coming from playing a very intense UNO card game with her friends.  ? ?Medications:  ?Current Outpatient Medications on File Prior to Visit  ?Medication Sig Dispense Refill  ? acetaminophen (TYLENOL) 500 MG tablet Take 2 tablets (1,000 mg total) by mouth every 6 (six) hours as needed. 30 tablet 0  ? amLODipine (NORVASC) 10 MG tablet Take 1 tablet by mouth once daily (Patient taking differently: Take 10 mg by mouth daily.) 30 tablet 3  ? atorvastatin (LIPITOR) 80 MG tablet Take 1 tablet (80 mg total) by mouth daily. 30 tablet 0  ? Blood Pressure Monitoring (BLOOD PRESSURE MONITOR AUTOMAT) DEVI 1 Device by Does not apply route daily. 1 Device 0  ? cilostazol (PLETAL) 50 MG tablet Take 1 tablet (50 mg total) by mouth 2 (two) times daily. 180 tablet 1  ? clobetasol ointment (TEMOVATE) 0.05 % Apply topically at bedtime.    ? Continuous Blood Gluc Sensor (FREESTYLE LIBRE 2 SENSOR) MISC 1 Device by Does not apply route every 14 (fourteen) days. 6 each 3  ? FLUoxetine (PROZAC) 20 MG capsule Take 1 capsule (20 mg total) by mouth daily. 90 capsule 2  ? fluticasone (FLONASE) 50  MCG/ACT nasal spray Place 1 spray into both nostrils 2 (two) times daily. 16 g 3  ? glucose blood (CONTOUR NEXT TEST) test strip 1 each by Other route 2 (two) times daily. And lancets 2/day 200 each 3  ? glucose blood (ONETOUCH VERIO) test strip USE TO CHECK BLOOD SUGAR TWICE A DAY AND PRN 100 each 6  ? hydrALAZINE (APRESOLINE) 50 MG tablet Take 1 tablet (50 mg total) by mouth 3 (three) times daily. 90 tablet 1  ? insulin glargine (LANTUS) 100 UNIT/ML Solostar Pen Inject 160 Units into the skin every morning.  And pen needles 1/day 165 mL 3  ? lisinopril-hydrochlorothiazide (ZESTORETIC) 20-12.5 MG tablet Take 2 tablets by mouth once daily (Patient taking differently: Take 2 tablets by mouth daily.) 60 tablet 3  ? metoprolol succinate (TOPROL-XL) 50 MG 24 hr tablet TAKE 1 TABLET BY MOUTH ONCE DAILY WITH  OR  IMMEDIATELY  FOLLOWING  A  MEAL 30 tablet 0  ? omeprazole (PRILOSEC) 20 MG capsule 1 cap 30 min before breakfast and 3-4 hours after supper, before going to bed. 180 capsule 1  ? ONE TOUCH LANCETS MISC USE TO CHECK BLOOD SUGAR TWICE A DAY AND PRN 100 each 6  ? topiramate (TOPAMAX) 25 MG tablet Take 1 tablet (25 mg total) by mouth 2 (two) times daily. 60 tablet 0  ? triamcinolone ointment (KENALOG) 0.5 % Apply topically 2 (two) times daily as needed. 30 g 1  ? ?No current facility-administered medications on file prior to visit.  ? ? ?Allergies: No Known Allergies ? ?Vital Signs:  ?BP (!) 184/92   Pulse 75   Ht '5\' 4"'$  (1.626 m)   Wt 192 lb (87.1 kg)   SpO2 96%   BMI 32.96 kg/m?  ? ?Neurological Exam: ?MENTAL STATUS including orientation to time, place, person, recent and remote memory, attention span and concentration, language, and fund of knowledge is normal.  Speech is not dysarthric. ? ?CRANIAL NERVES:  No visual field defects.  Pupils equal round and reactive to light.  Normal conjugate, extra-ocular eye movements in all directions of gaze.  No ptosis .  Face is symmetric. Palate elevates symmetrically.  Tongue is midline. ? ?MOTOR:  Motor strength is 5/5 in all extremities.  No atrophy, fasciculations or abnormal movements.  No pronator drift.  Tone is normal.   ? ?MSRs:  Reflexes are 2+/4 throughout. ? ?SENSORY:  Mildly diminished vibration at the ankles. ? ?COORDINATION/GAIT:  Normal finger-to- nose-finger.  Intact rapid alternating movements bilaterally.  Gait mildly wide-based, stable, unassisted. ? ?Data: ?NCS/EMG of the upper extremities 06/14/2018: ?The electrophysiologic findings are consistent with a  sensorimotor polyneuropathy, with axonal and demyelinating features.  Overall, these findings are moderate in degree electrically. ?  ?MRI head 02/07/2021: ?1. Two acute/early subacute infarcts within the left pons measuring up to 8 mm. ?2. Otherwise stable noncontrast MRI appearance of the brain as compared to 04/09/2020. ?3. Mild chronic small vessel ischemic changes within the cerebral white matter. ?4. Small chronic lacunar infarct within the left cerebellar hemisphere. ?5. Incompletely imaged and incompletely assessed cervical lymphadenopathy. ?  ?MRA head: ?1. New from the prior MRA head of 05/10/2010, the distal V4 right vertebral artery appears occluded. Also new, there is near-occlusive ?stenosis versus occlusion of the V4 left vertebral artery (just beyond the left PICA origin). Redemonstrated severe stenosis within ?the distal V4 left vertebral artery. The basilar artery is patent, but somewhat diminutive. ?2. Progressive atherosclerotic disease elsewhere, as outlined and with findings most  notably as follows. ?3. Progressive severe stenosis within the P1 left PCA. Progressive moderate/severe stenosis within the left PCA at the P2/P3 junction. ?4. New severe stenosis within the left posterior communicating artery. ?5. Progressive moderate stenosis of the supraclinoid right ICA. ?6. Progressive moderate stenosis of the proximal M1 right MCA. ?Progressive moderate/severe stenosis within the distal M1 right MCA. ?7. Redemonstrated severe stenosis within an inferior division proximal M2 right MCA vessel. ?8. New moderate/severe stenosis within a superior division proximal M2 left MCA vessel. ?9. Redemonstrated moderate/severe stenosis within an inferior division proximal M2 left MCA vessel. ?10. Progressive moderate/severe stenosis within the proximal A1 right ACA. ?11. 2 mm inferiorly projecting vascular protrusion arising from the cavernous left ICA, which may reflect atherosclerotic lobulation or ?a small  aneurysm. This finding was not definitively present on the prior MRA. ?  ?Lab Results  ?Component Value Date  ? CHOL 223 (H) 02/08/2021  ? HDL 35 (L) 02/08/2021  ? LDLCALC 169 (H) 02/08/2021  ? LDLDIRECT 175.

## 2021-08-02 ENCOUNTER — Telehealth: Payer: Self-pay | Admitting: Family Medicine

## 2021-08-02 NOTE — Telephone Encounter (Signed)
Short Term Disability form to be filled out--place in dr's folder.  Call [phone] upon completion. ?

## 2021-08-02 NOTE — Telephone Encounter (Signed)
Paperwork received.

## 2021-08-02 NOTE — Telephone Encounter (Signed)
Paperwork completed & on PCP's desk to be signed.  ?

## 2021-08-03 NOTE — Telephone Encounter (Signed)
Patient is aware that paperwork is completed. She requested for copies to be put in the envelope for her to pick up & a copy to be faxed to her employer as well. Copies and original placed up front & faxed.  ?

## 2021-08-27 NOTE — Progress Notes (Signed)
? ? ?ACUTE VISIT ?Chief Complaint  ?Patient presents with  ? Nausea  ?  Feeling in stomach, ongoing about a month. Has been trying home remedies without relief.  ? Cough  ? uncomfortable swallowing  ? ?HPI: ?[name] is a 61 y.o. female with hx of DM II,HTN,CVA,PAD,HLD, and small lymphocytic lymphoma here today with above complaints. ?"Funny" feeling in her throat and feeling like a "knot" in the middle of her chest when eating solids for about a month. ?Epigastric and throat burning like sensation. ?GERD on omeprazole 20 mg daily, has not taken it for 2 weeks but did not feel like it was helping. ? ?Cough ?The current episode started 1 to 4 weeks ago. The problem occurs every few hours. The cough is Non-productive. Associated symptoms include heartburn and postnasal drip. Pertinent negatives include no chest pain, chills, ear congestion, ear pain, fever, hemoptysis, nasal congestion, rash, sore throat, shortness of breath, sweats or wheezing. Nothing aggravates the symptoms. She has tried nothing for the symptoms. Her past medical history is significant for environmental allergies.  ?She is on Lisinopril for HTN. ? ?Constipation: "Always" has had hard stools. ?+ Straining. ?No associated abdominal pain,vomiting,blood in stool, or melena. ?+ Nausea. ?She has not re-scheduled her colonoscopy. ? ?PAD: States that she is not taking Pletal 50 mg bid, caused knee soreness.She is on Atorvastatin 80 mg daily. ? ?Small lymphocytic lymphoma: Follows with oncologist. ? ?Review of Systems  ?Constitutional:  Positive for fatigue. Negative for activity change, appetite change, chills and fever.  ?HENT:  Positive for postnasal drip. Negative for ear pain, mouth sores, nosebleeds and sore throat.   ?Respiratory:  Positive for cough. Negative for hemoptysis, shortness of breath and wheezing.   ?Cardiovascular:  Negative for chest pain and palpitations.  ?Gastrointestinal:  Positive for heartburn. Negative for vomiting.   ?     Negative for changes in bowel habits.  ?Genitourinary:  Negative for dysuria and hematuria.  ?Skin:  Negative for rash.  ?Allergic/Immunologic: Positive for environmental allergies.  ?Neurological:  Negative for syncope and weakness.  ?Rest see pertinent positives and negatives per HPI. ? ?Current Outpatient Medications on File Prior to Visit  ?Medication Sig Dispense Refill  ? acetaminophen (TYLENOL) 500 MG tablet Take 2 tablets (1,000 mg total) by mouth every 6 (six) hours as needed. 30 tablet 0  ? atorvastatin (LIPITOR) 80 MG tablet Take 1 tablet (80 mg total) by mouth daily. 30 tablet 0  ? Blood Pressure Monitoring (BLOOD PRESSURE MONITOR AUTOMAT) DEVI 1 Device by Does not apply route daily. 1 Device 0  ? cilostazol (PLETAL) 50 MG tablet Take 1 tablet (50 mg total) by mouth 2 (two) times daily. 180 tablet 1  ? clobetasol ointment (TEMOVATE) 0.05 % Apply topically at bedtime.    ? Continuous Blood Gluc Sensor (FREESTYLE LIBRE 2 SENSOR) MISC 1 Device by Does not apply route every 14 (fourteen) days. 6 each 3  ? fluticasone (FLONASE) 50 MCG/ACT nasal spray Place 1 spray into both nostrils 2 (two) times daily. 16 g 3  ? glucose blood (CONTOUR NEXT TEST) test strip 1 each by Other route 2 (two) times daily. And lancets 2/day 200 each 3  ? glucose blood (ONETOUCH VERIO) test strip USE TO CHECK BLOOD SUGAR TWICE A DAY AND PRN 100 each 6  ? hydrALAZINE (APRESOLINE) 50 MG tablet Take 1 tablet (50 mg total) by mouth 3 (three) times daily. 90 tablet 1  ? insulin glargine (LANTUS) 100 UNIT/ML Solostar Pen Inject 160  Units into the skin every morning. And pen needles 1/day 165 mL 3  ? ONE TOUCH LANCETS MISC USE TO CHECK BLOOD SUGAR TWICE A DAY AND PRN 100 each 6  ? triamcinolone ointment (KENALOG) 0.5 % Apply topically 2 (two) times daily as needed. 30 g 1  ? topiramate (TOPAMAX) 25 MG tablet Take 1 tablet (25 mg total) by mouth 2 (two) times daily. 60 tablet 0  ? ?No current facility-administered medications on file  prior to visit.  ? ?Past Medical History:  ?Diagnosis Date  ? Abnormality of gait 05/10/2010  ? BACK PAIN 11/14/2008  ? Class 1 obesity due to excess calories with body mass index (BMI) of 31.0 to 31.9 in adult 02/07/2021  ? DIABETES MELLITUS, TYPE II 07/15/2008  ? Diplopia 07/15/2008  ? ECZEMA, ATOPIC 04/03/2009  ? Guillain-Barre ([place]) 1988  ? HYPERLIPIDEMIA 03/06/2009  ? HYPERTENSION 07/15/2008  ? Stroke [facility]) 2010, 2011  ? x2   ? Vertebral artery stenosis   ? ?No Known Allergies ? ?Social History  ? ?Socioeconomic History  ? Marital status: Single  ?  Spouse name: Not on file  ? Number of children: 0  ? Years of education: Not on file  ? Highest education level: Not on file  ?Occupational History  ? Occupation: custodian at [employer]	[place]  ?Tobacco Use  ? Smoking status: Never  ? Smokeless tobacco: Never  ?Vaping Use  ? Vaping Use: Never used  ?Substance and Sexual Activity  ? Alcohol use: No  ? Drug use: No  ? Sexual activity: Not on file  ?Other Topics Concern  ? Not on file  ?Social History Narrative  ? Right Handed   ? Lives in a one story home   ? ?Social Determinants of Health  ? ?Financial Resource Strain: Not on file  ?Food Insecurity: Not on file  ?Transportation Needs: Not on file  ?Physical Activity: Not on file  ?Stress: Not on file  ?Social Connections: Not on file  ? ?Vitals:  ? 08/30/21 1239  ?BP: 136/80  ?Pulse: 78  ?Resp: 16  ?SpO2: 97%  ? ?Body mass index is 32.7 kg/m?. ? ?Physical Exam ?Vitals and nursing note reviewed.  ?Constitutional:   ?   General: She is not in acute distress. ?   Appearance: She is well-developed.  ?HENT:  ?   Head: Normocephalic and atraumatic.  ?   Mouth/Throat:  ?   Mouth: Mucous membranes are moist.  ?   Pharynx: Oropharynx is clear.  ?Eyes:  ?   Conjunctiva/sclera: Conjunctivae normal.  ?Cardiovascular:  ?   Rate and Rhythm: Normal rate and regular rhythm.  ?   Heart sounds: No murmur heard. ?   Comments: DP pulses present. ?Pulmonary:  ?   Effort: Pulmonary  effort is normal. No respiratory distress.  ?   Breath sounds: Normal breath sounds.  ?Abdominal:  ?   Palpations: Abdomen is soft. There is no hepatomegaly or mass.  ?   Tenderness: There is no abdominal tenderness.  ?Lymphadenopathy:  ?   Cervical: No cervical adenopathy.  ?Skin: ?   General: Skin is warm.  ?   Findings: No erythema or rash.  ?Neurological:  ?   General: No focal deficit present.  ?   Mental Status: She is alert and oriented to person, place, and time.  ?   Cranial Nerves: No cranial nerve deficit.  ?   Comments: Otherwise stable gait, assisted by a cane.  ?Psychiatric:  ?  Comments: Well groomed, good eye contact.  ? ?ASSESSMENT AND PLAN: ? ?[name] was seen today for nausea, cough and uncomfortable swallowing. ? ?Diagnoses and all orders for this visit: ?Orders Placed This Encounter  ?Procedures  ? DG SWALLOW FUNC SPEECH PATH  ? ?Dysphagia, unspecified type ?New problem. ?We discussed possible etiologies. ?Swallowing study will be arranged. ?Recommend chewing food well, avoid talking and eating at the same time,and small pieces at the time. ?If not better with new PPI she may need EGD. ? ?Gastroesophageal reflux disease, unspecified whether esophagitis present ?Omeprazole did not help. ?Recommend Protonix 40 mg daily. ?GERD precautions. ? ?-     pantoprazole (PROTONIX) 40 MG tablet; Take 1 tablet (40 mg total) by mouth daily. ? ?Cough, unspecified type ?We discussed possible etiologies. ?Lung auscultation negative. ?Some of her chronic medical problems can be contributing factors. ?Offered CXR, she would like to hold on it. ?Lisinopril may need to be d/c if not resolved. ? ?Peripheral arterial disease ([place]) ?She does not want to try again Pletal because it caused knee soreness. ?Continue Atorvastatin 80 mg daily. ?She is not on antiplatelet therapy. ?Following with cardiologist. ? ?Constipation, unspecified constipation type ?Adequate fiber and fluid intake. ?Miralax at night daily as needed  recommended. ?She needs to re-schedule her colonoscopy. ? ?Small lymphocytic lymphoma ([place]) ?Remind her about her next appt with Dr Irene Limbo. ? ?Return if symptoms worsen or fail to improve, for Keep next app

## 2021-08-30 ENCOUNTER — Telehealth: Payer: Self-pay | Admitting: Family Medicine

## 2021-08-30 ENCOUNTER — Ambulatory Visit (INDEPENDENT_AMBULATORY_CARE_PROVIDER_SITE_OTHER): Payer: 59 | Admitting: Family Medicine

## 2021-08-30 ENCOUNTER — Encounter: Payer: Self-pay | Admitting: Family Medicine

## 2021-08-30 VITALS — BP 136/80 | HR 78 | Resp 16 | Ht 64.0 in | Wt 190.5 lb

## 2021-08-30 DIAGNOSIS — R1310 Dysphagia, unspecified: Secondary | ICD-10-CM | POA: Diagnosis not present

## 2021-08-30 DIAGNOSIS — R059 Cough, unspecified: Secondary | ICD-10-CM

## 2021-08-30 DIAGNOSIS — I739 Peripheral vascular disease, unspecified: Secondary | ICD-10-CM

## 2021-08-30 DIAGNOSIS — E1169 Type 2 diabetes mellitus with other specified complication: Secondary | ICD-10-CM

## 2021-08-30 DIAGNOSIS — K5900 Constipation, unspecified: Secondary | ICD-10-CM

## 2021-08-30 DIAGNOSIS — K219 Gastro-esophageal reflux disease without esophagitis: Secondary | ICD-10-CM

## 2021-08-30 DIAGNOSIS — C8300 Small cell B-cell lymphoma, unspecified site: Secondary | ICD-10-CM

## 2021-08-30 MED ORDER — PANTOPRAZOLE SODIUM 40 MG PO TBEC: 40.0000 mg | DELAYED_RELEASE_TABLET | Freq: Every day | ORAL | 3 refills | Status: DC

## 2021-08-30 NOTE — Telephone Encounter (Signed)
Pt stated she dropped off a disability form to be signed and wanted to see if the form was complete. She was seen in office today.  ? ?Please advise.  ?

## 2021-08-30 NOTE — Patient Instructions (Addendum)
A few things to remember from today's visit: ? ? ?Dysphagia, unspecified type - Plan: DG SWALLOW FUNC SPEECH PATH ? 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 ?Hyperlipidemia associated with type 2 diabetes mellitus ([place]), Chronic ? ?Gastroesophageal reflux disease, unspecified whether esophagitis present - Plan: pantoprazole (PROTONIX) 40 MG tablet ? ?Cough, unspecified type ? ?If you need refills please call your pharmacy. ?Do not use My Chart to request refills or for acute issues that need immediate attention. ?  ?Omeprazole discontinued. ?Protonix started today. ?Keep appt with Dr Irene Limbo. ?No changes in rest today. ?If cough persist, we will need to stop Lisinopril. ?If swallowing problem doe snot resolve we will need gastro evaluation to discuss endoscopy. ? ?Fall prevention. ? ?Please be sure medication list is accurate. ?If a new problem present, please set up appointment sooner than planned today. ? ? ? ? ? ? ? ?

## 2021-08-30 NOTE — Telephone Encounter (Signed)
A telephone encounter was not created when patient dropped her form off. Form has been filled out & placed in pcp's bin to be signed. ?

## 2021-09-01 ENCOUNTER — Other Ambulatory Visit: Payer: Self-pay | Admitting: Family Medicine

## 2021-09-01 DIAGNOSIS — I10 Essential (primary) hypertension: Secondary | ICD-10-CM

## 2021-09-01 NOTE — Telephone Encounter (Signed)
I spoke with patient. She is aware paperwork is ready for pick up. A copy has been faxed to her job, a copy sent to scan, and a copy was added to her originals for her to keep.  ?

## 2021-09-02 ENCOUNTER — Encounter: Payer: Self-pay | Admitting: Family Medicine

## 2021-09-03 ENCOUNTER — Other Ambulatory Visit: Payer: Self-pay

## 2021-09-03 DIAGNOSIS — C8300 Small cell B-cell lymphoma, unspecified site: Secondary | ICD-10-CM

## 2021-09-06 ENCOUNTER — Inpatient Hospital Stay: Payer: 59 | Admitting: Hematology

## 2021-09-06 ENCOUNTER — Other Ambulatory Visit: Payer: Self-pay

## 2021-09-06 ENCOUNTER — Inpatient Hospital Stay: Payer: 59 | Attending: Hematology

## 2021-09-06 VITALS — BP 150/72 | HR 70 | Temp 97.9°F | Resp 17 | Ht 64.0 in | Wt 194.7 lb

## 2021-09-06 DIAGNOSIS — Z08 Encounter for follow-up examination after completed treatment for malignant neoplasm: Secondary | ICD-10-CM | POA: Diagnosis present

## 2021-09-06 DIAGNOSIS — Z8572 Personal history of non-Hodgkin lymphomas: Secondary | ICD-10-CM | POA: Diagnosis present

## 2021-09-06 DIAGNOSIS — C8300 Small cell B-cell lymphoma, unspecified site: Secondary | ICD-10-CM | POA: Diagnosis not present

## 2021-09-06 DIAGNOSIS — R718 Other abnormality of red blood cells: Secondary | ICD-10-CM | POA: Diagnosis not present

## 2021-09-06 LAB — CMP (CANCER CENTER ONLY)
ALT: 28 U/L (ref 0–44)
AST: 19 U/L (ref 15–41)
Albumin: 3.2 g/dL — ABNORMAL LOW (ref 3.5–5.0)
Alkaline Phosphatase: 98 U/L (ref 38–126)
Anion gap: 6 (ref 5–15)
BUN: 30 mg/dL — ABNORMAL HIGH (ref 8–23)
CO2: 28 mmol/L (ref 22–32)
Calcium: 9.9 mg/dL (ref 8.9–10.3)
Chloride: 103 mmol/L (ref 98–111)
Creatinine: 1.21 mg/dL — ABNORMAL HIGH (ref 0.44–1.00)
GFR, Estimated: 51 mL/min — ABNORMAL LOW (ref >60)
Glucose, Bld: 238 mg/dL — ABNORMAL HIGH (ref 70–99)
Potassium: 4.1 mmol/L (ref 3.5–5.1)
Sodium: 137 mmol/L (ref 135–145)
Total Bilirubin: 0.3 mg/dL (ref 0.3–1.2)
Total Protein: 7.2 g/dL (ref 6.5–8.1)

## 2021-09-06 LAB — CBC WITH DIFFERENTIAL/PLATELET
Abs Immature Granulocytes: 0.02 10*3/uL (ref 0.00–0.07)
Basophils Absolute: 0 10*3/uL (ref 0.0–0.1)
Basophils Relative: 1 %
Eosinophils Absolute: 0.1 10*3/uL (ref 0.0–0.5)
Eosinophils Relative: 2 %
HCT: 36.2 % (ref 36.0–46.0)
Hemoglobin: 12.2 g/dL (ref 12.0–15.0)
Immature Granulocytes: 0 %
Lymphocytes Relative: 46 %
Lymphs Abs: 2.3 10*3/uL (ref 0.7–4.0)
MCH: 26.1 pg (ref 26.0–34.0)
MCHC: 33.7 g/dL (ref 30.0–36.0)
MCV: 77.4 fL — ABNORMAL LOW (ref 80.0–100.0)
Monocytes Absolute: 0.6 10*3/uL (ref 0.1–1.0)
Monocytes Relative: 12 %
Neutro Abs: 2 10*3/uL (ref 1.7–7.7)
Neutrophils Relative %: 39 %
Platelets: 235 10*3/uL (ref 150–400)
RBC: 4.68 MIL/uL (ref 3.87–5.11)
RDW: 14.2 % (ref 11.5–15.5)
Smear Review: NORMAL
WBC: 5 10*3/uL (ref 4.0–10.5)
nRBC: 0 % (ref 0.0–0.2)

## 2021-09-06 LAB — IRON AND IRON BINDING CAPACITY (CC-WL,HP ONLY)
Iron: 90 ug/dL (ref 28–170)
Saturation Ratios: 28 % (ref 10.4–31.8)
TIBC: 321 ug/dL (ref 250–450)
UIBC: 231 ug/dL (ref 148–442)

## 2021-09-06 LAB — FERRITIN: Ferritin: 294 ng/mL (ref 11–307)

## 2021-09-06 IMAGING — MG MM BREAST LOCALIZATION CLIP
2 series · 3 of 6 positions shown · non-contrast
Comparison: Previous exam(s).

CLINICAL DATA: Status post ultrasound-guided core biopsy of
enlarged RIGHT axillary lymph node.

EXAM:
DIAGNOSTIC RIGHT MAMMOGRAM POST ULTRASOUND BIOPSY

[R MLO synth-2D]
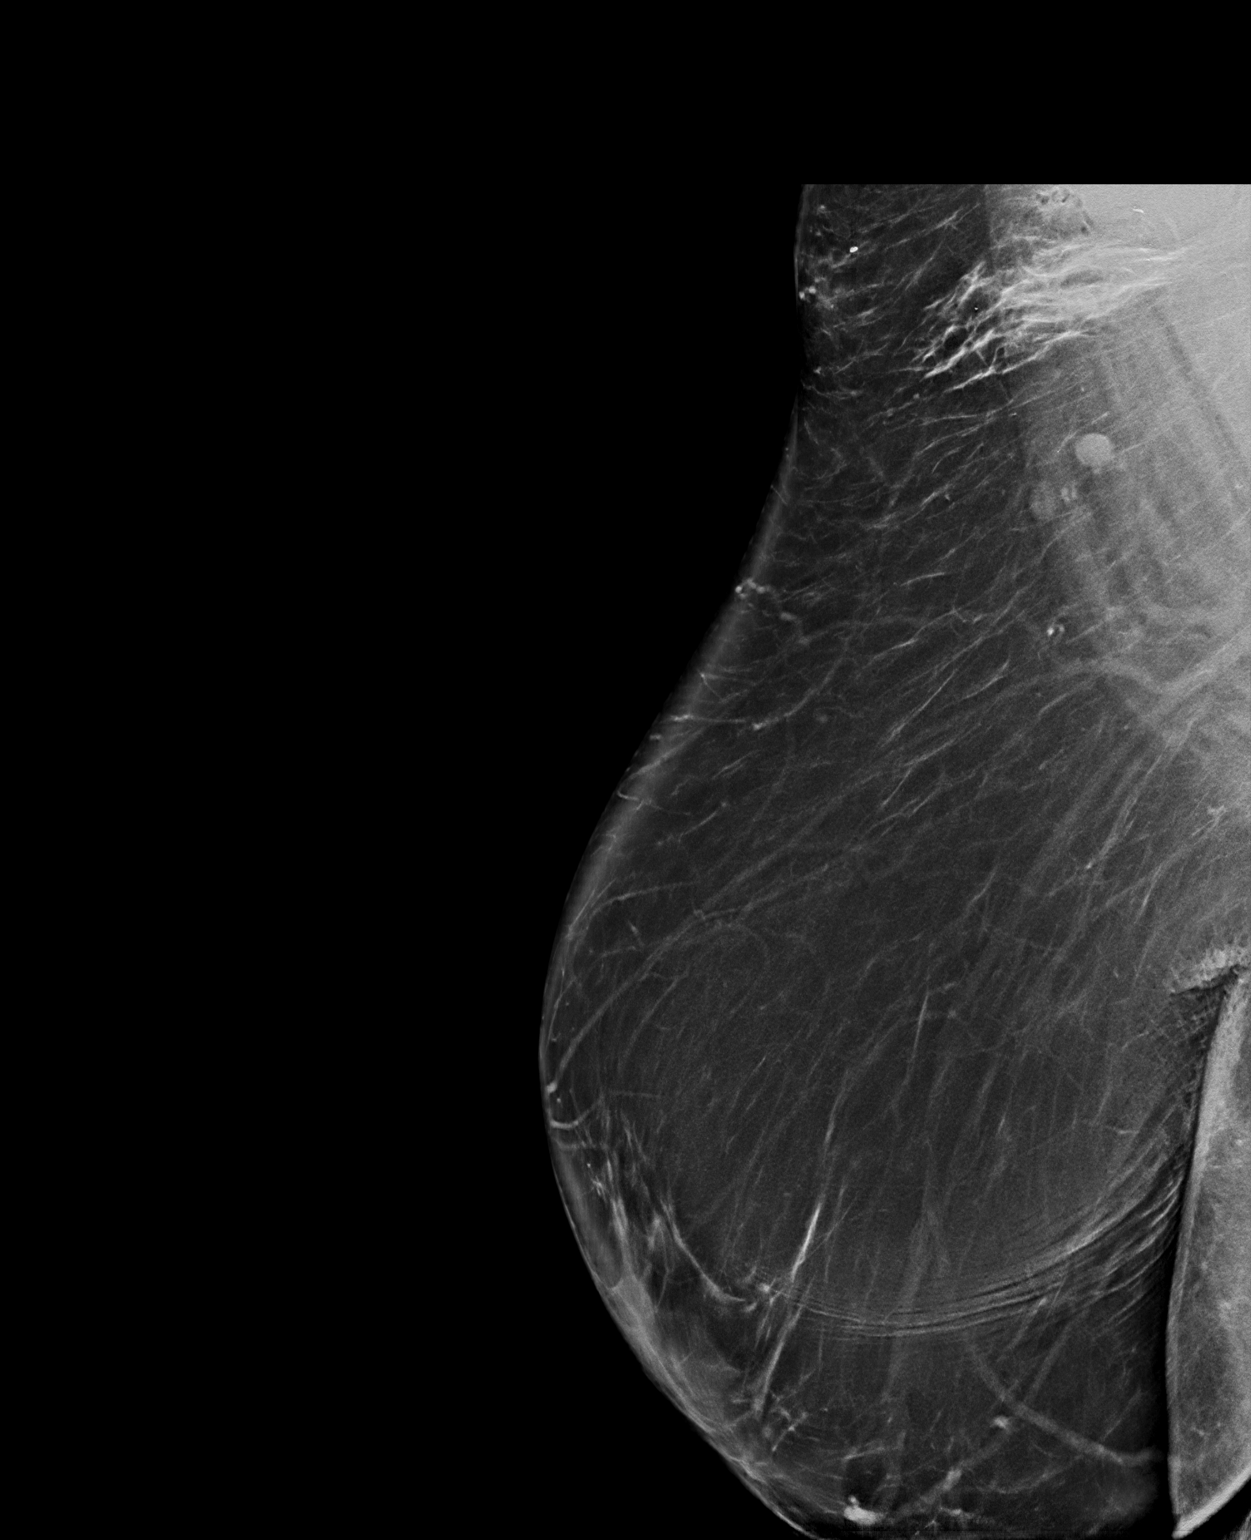

[R MLO tomo · 2 of 107 frames shown]
[frame 35/107]
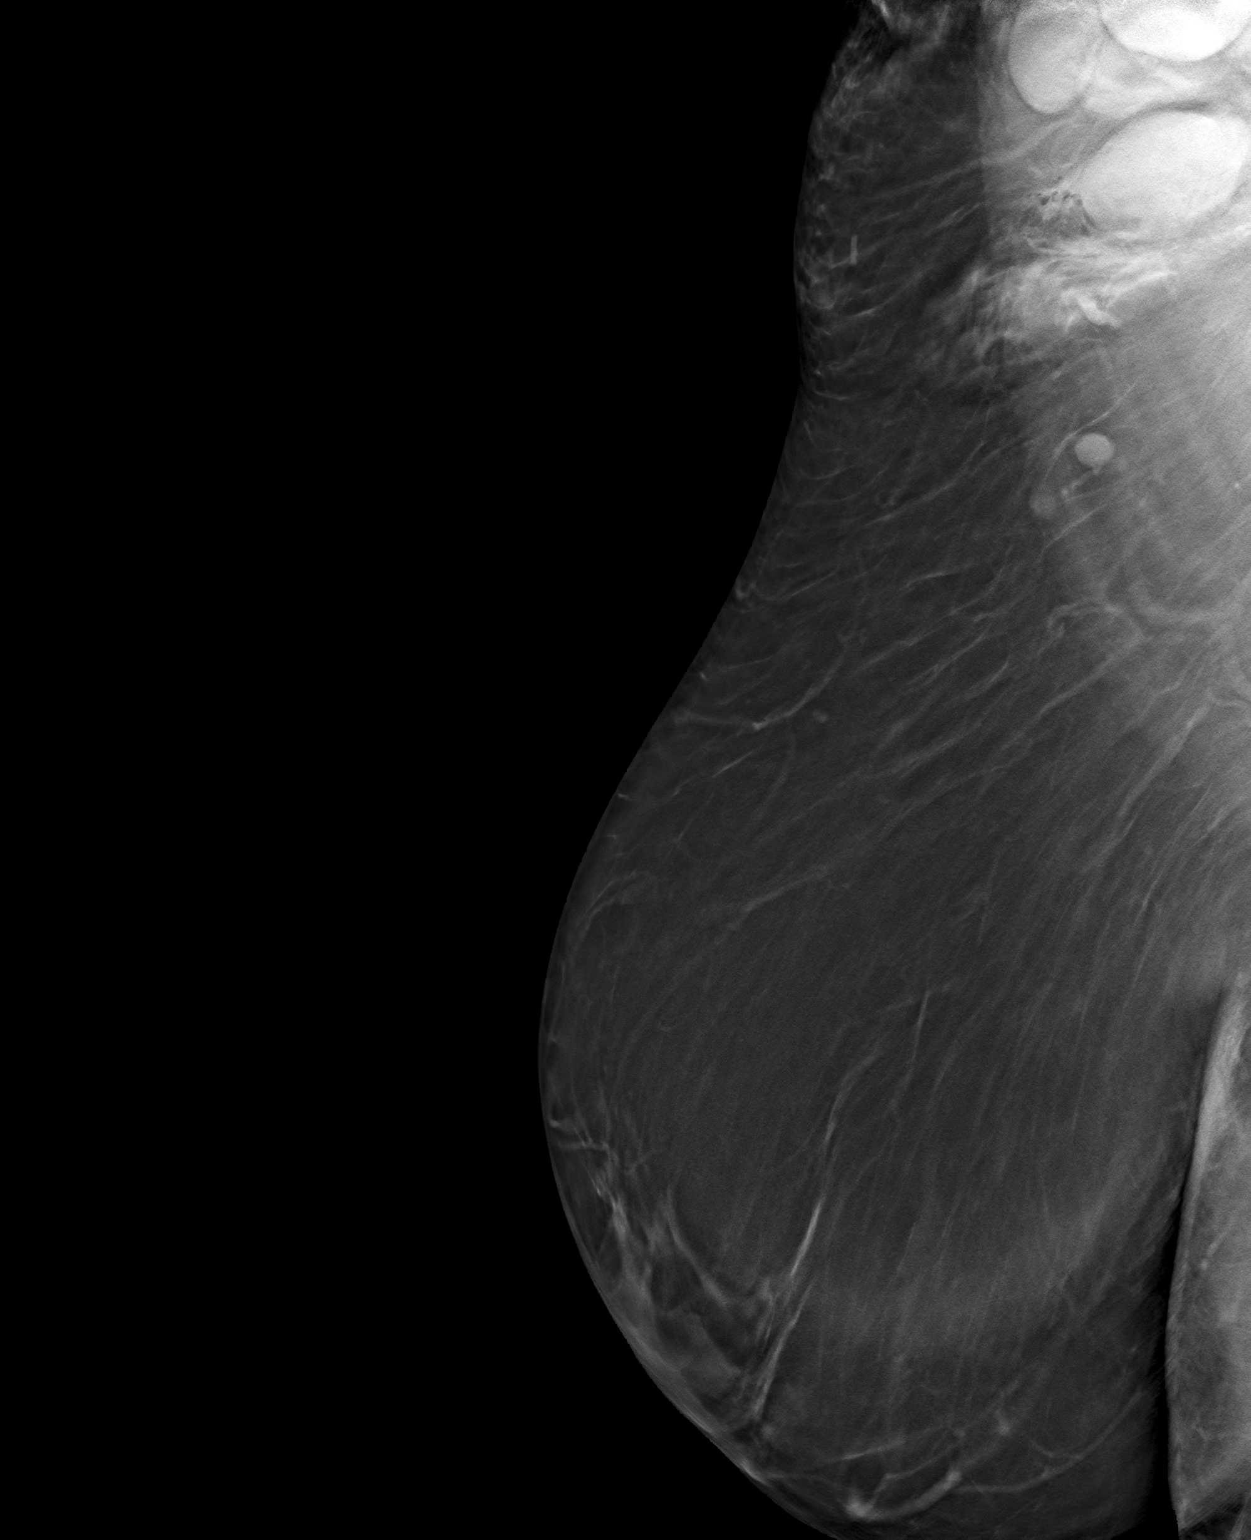
[frame 54/107]
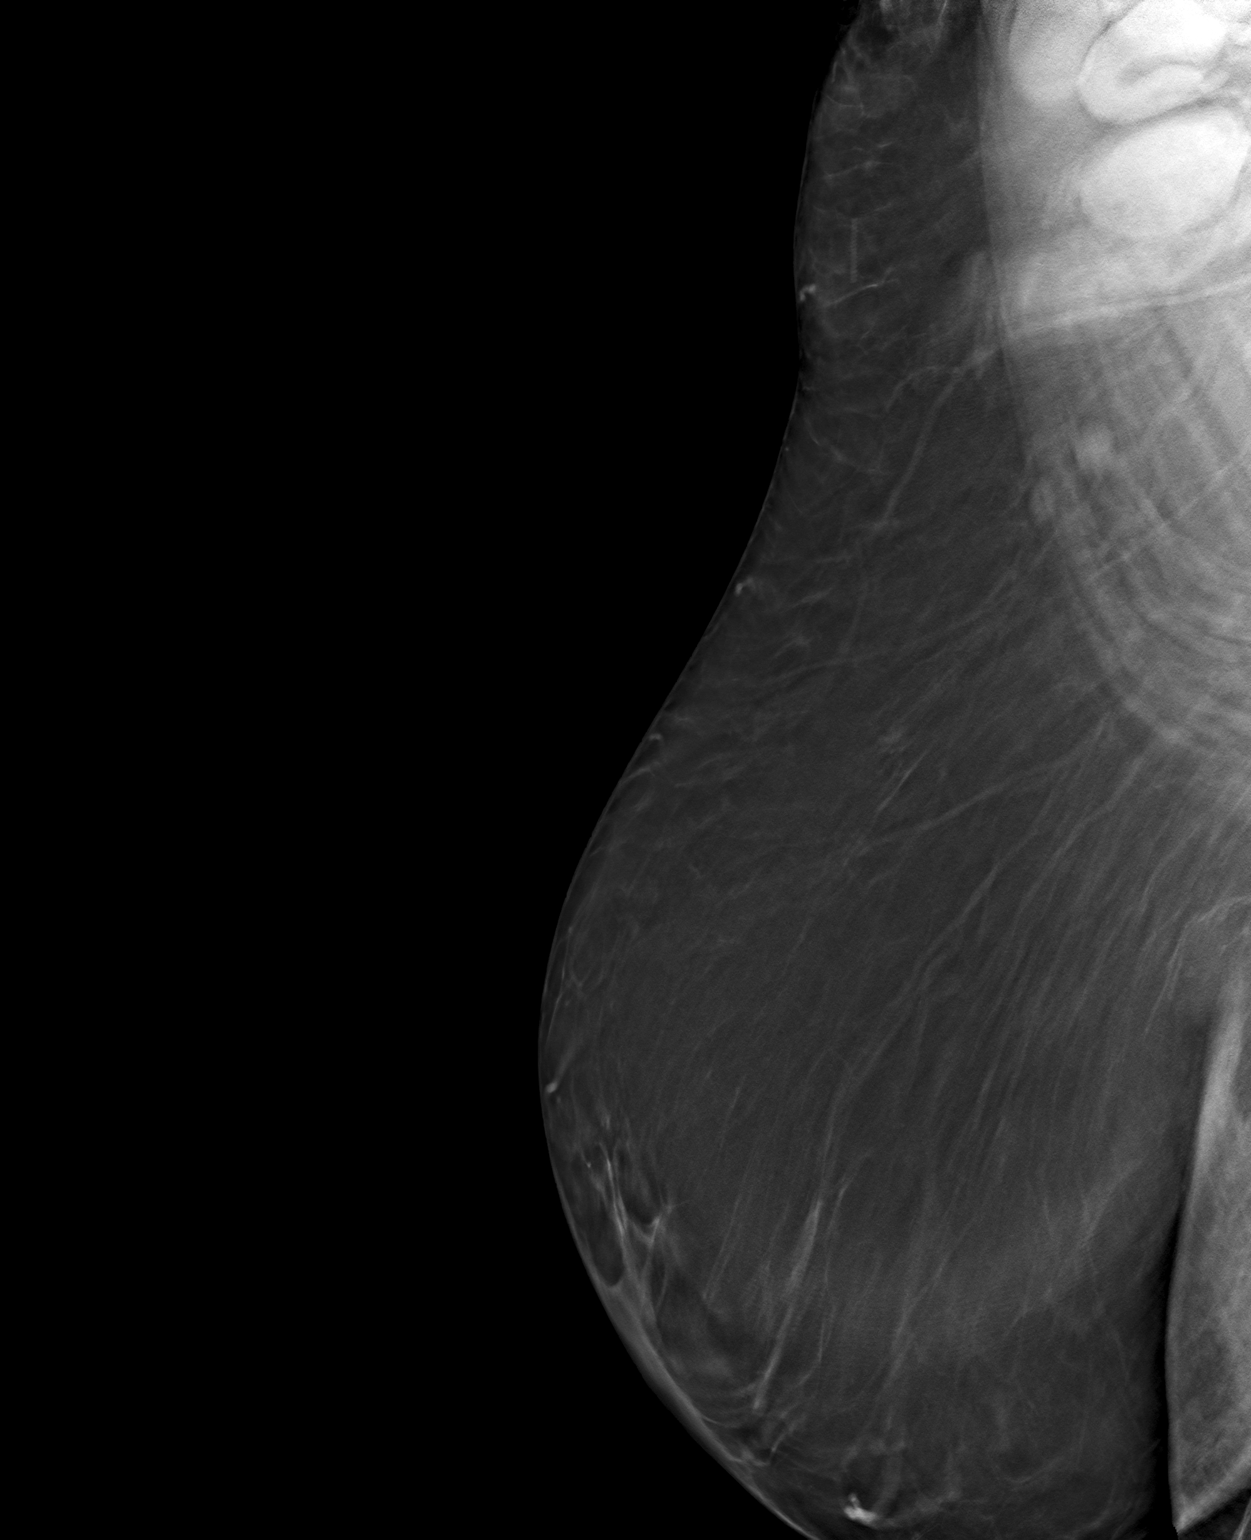

[3 of 6 positions shown; findings below may reference images not displayed]

FINDINGS: Mammographic images were obtained following ultrasound guided biopsy
of RIGHT axillary lymph node and placement of a HydroMARK spiral
shaped clip. The biopsy marking clip is in expected position at the
site of biopsy.
IMPRESSION: Appropriate positioning of the spiral HydroMARK shaped biopsy
marking clip at the site of biopsy in the RIGHT axillary lymph.

Final Assessment: Post Procedure Mammograms for Marker Placement

## 2021-09-06 IMAGING — US US AXILLARY NODE CORE BIOPSY RIGHT
1 series · 12 of 12 positions shown · non-contrast
Comparison: Previous exam(s).
COMPARISON: Previous exam(s).
COMPARISON: Previous exam(s).

Addendum:
CLINICAL DATA: Patient presents for ultrasound-guided core biopsy
of RIGHT axillary lymph node.

EXAM:
US AXILLARY NODE CORE BIOPSY RIGHT

[Series 1: us axillary node core biopsy right · 0.08mm/px · 12 of 12 slices shown]
[im 1/12]
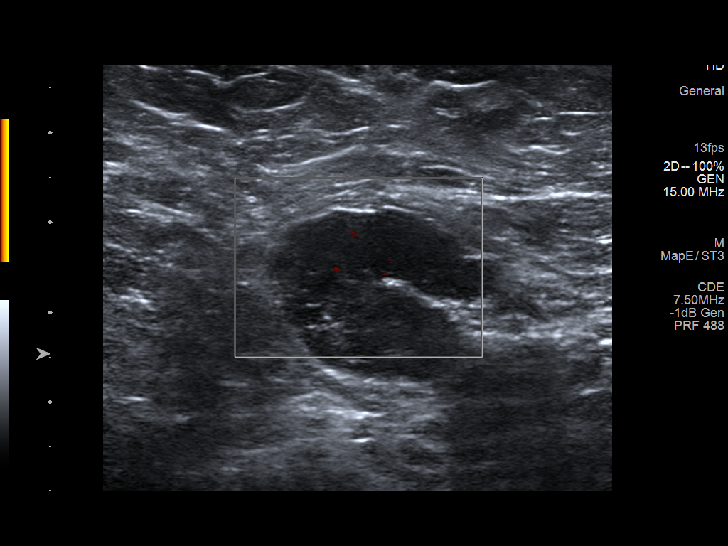
[im 2/12]
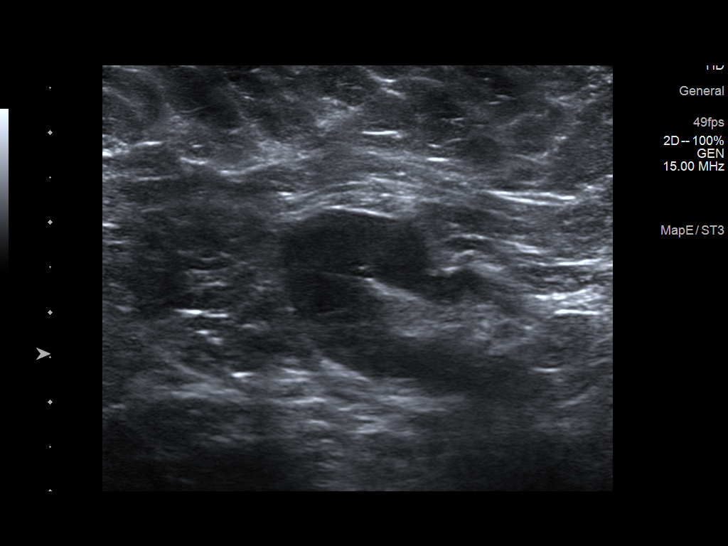
[im 3/12]
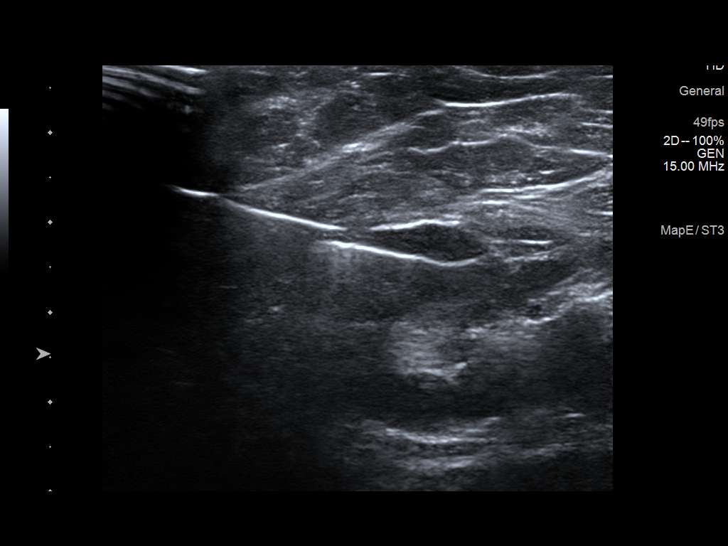
[im 4/12]
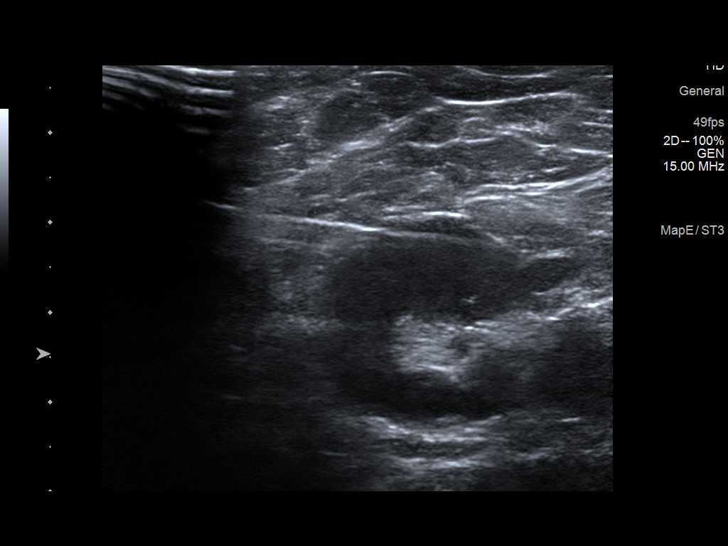
[im 5/12]
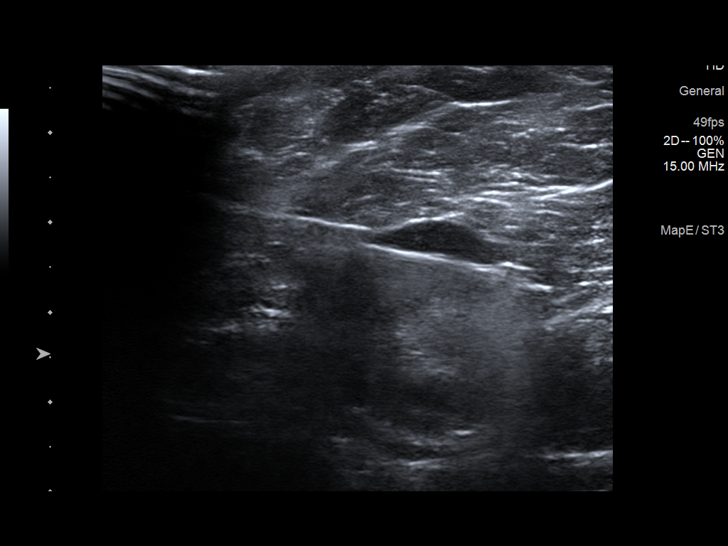
[im 6/12]
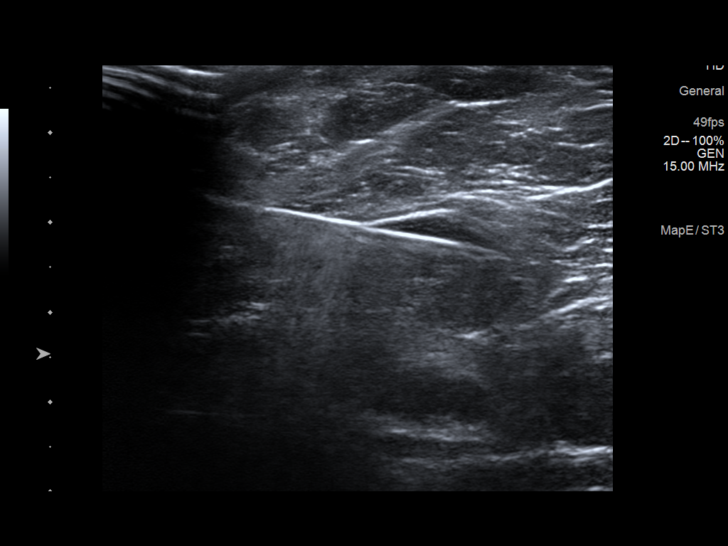
[im 7/12]
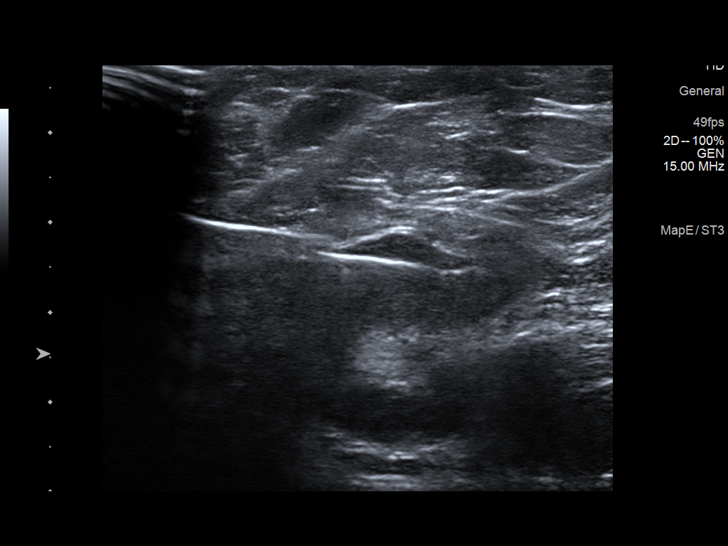
[im 8/12]
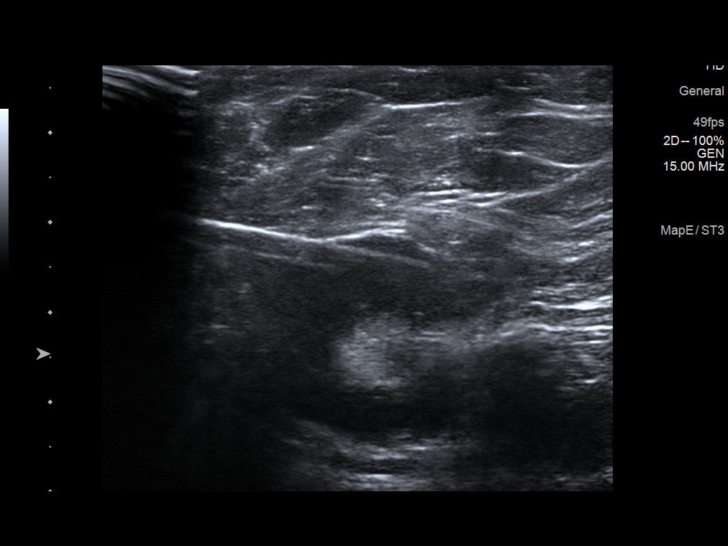
[im 9/12]
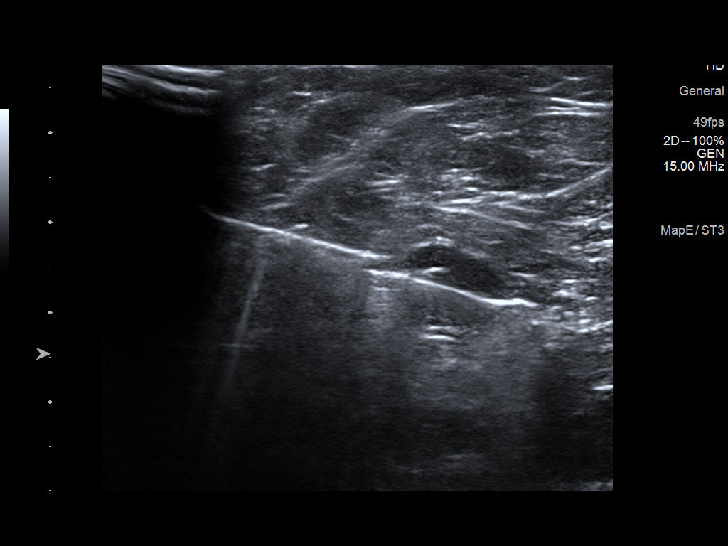
[im 10/12]
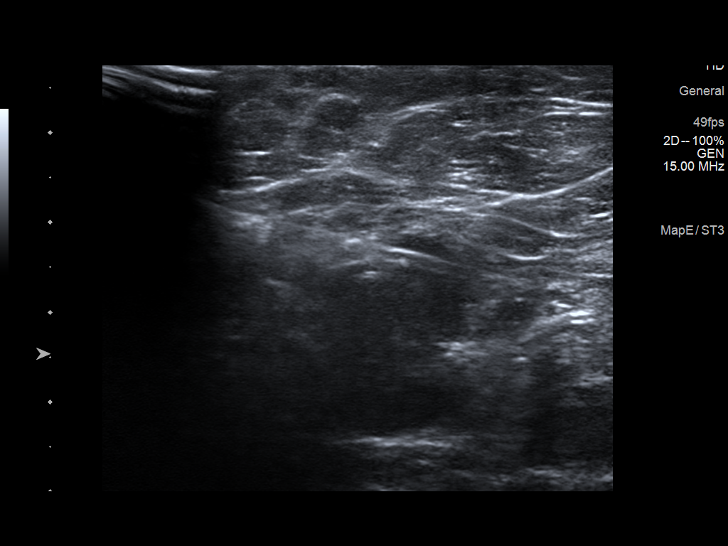
[im 11/12]
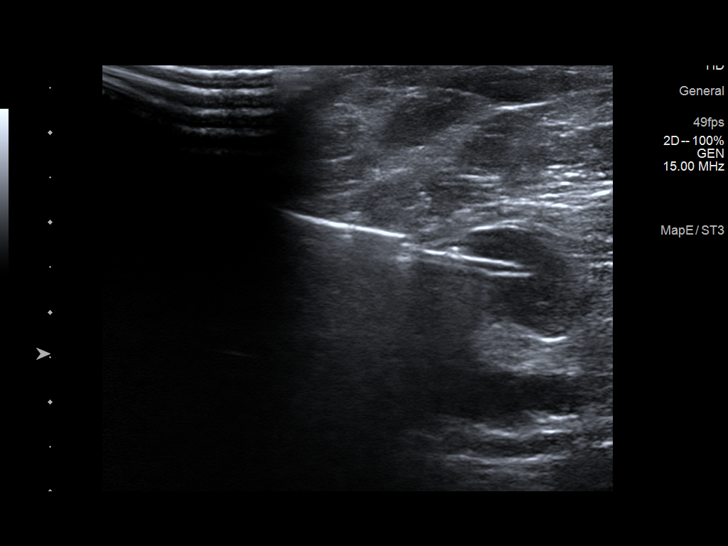
[im 12/12]
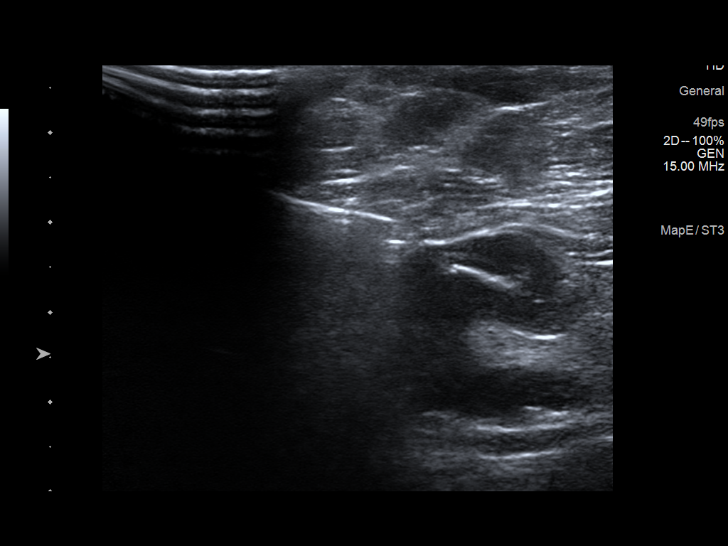

[12 of 12 positions shown; findings below may reference images not displayed]



Using sterile technique and 1% Lidocaine as local anesthetic, under
direct ultrasound visualization, a 12 gauge Geetha device was
used to perform biopsy of enlarged lymph node in the LOWER RIGHT
axilla using a LATERAL to MEDIAL approach. At the conclusion of the
procedure spiral HydroMARK tissue marker clip was deployed into the
biopsy cavity. Follow up 2 view mammogram was performed and dictated
separately.
IMPRESSION: Ultrasound guided biopsy of RIGHT axillary lymph node. No apparent
complications.

ADDENDUM:
PATHOLOGY ADDENDUM:

Pathology RIGHT axillary lymph node: Non-Hodgkin B-cell lymphoma.
Findings are consistent with non-Hodgkin's B-cell lymphoma. The main
differential consideration is chronic lymphocytic leukemia/small
lymphocytic lymphoma. Immunohistochemistry stains to exclude mantle
cell lymphoma are pending.

Pathology concordance with imaging findings: Yes

Recommendation: Medical oncology referral

At the request of the patient, I spoke with her by telephone on
06/28/2019 at [DATE]. She reports doing well after the biopsy . We
discussed the pathology results and plan.

ADDENDUM:
Pathology :

FyclinMW is negative and thus the findings are consistent with
chronic lymphocytic leukemia/small lymphocytic lymphoma.

Patient will follow-up with Dr. Marck. I have not been able to
reach Dr. Marck at the time of dictation to discuss the findings.

*** End of Addendum ***
Addendum:
PROCEDURE:
I met with the patient and we discussed the procedure of
ultrasound-guided biopsy, including benefits and alternatives. We
discussed the high likelihood of a successful procedure. We
discussed the risks of the procedure, including infection, bleeding,
tissue injury, clip migration, and inadequate sampling. Informed
written consent was given. The usual time-out protocol was performed
immediately prior to the procedure.

Using sterile technique and 1% Lidocaine as local anesthetic, under
direct ultrasound visualization, a 12 gauge Geetha device was
used to perform biopsy of enlarged lymph node in the LOWER RIGHT
axilla using a LATERAL to MEDIAL approach. At the conclusion of the
procedure spiral HydroMARK tissue marker clip was deployed into the
biopsy cavity. Follow up 2 view mammogram was performed and dictated
separately.
IMPRESSION: Ultrasound guided biopsy of RIGHT axillary lymph node. No apparent
complications.

ADDENDUM:
PATHOLOGY ADDENDUM:

Pathology RIGHT axillary lymph node: Non-Hodgkin B-cell lymphoma.
Findings are consistent with non-Hodgkin's B-cell lymphoma. The main
differential consideration is chronic lymphocytic leukemia/small
lymphocytic lymphoma. Immunohistochemistry stains to exclude mantle
cell lymphoma are pending.

Pathology concordance with imaging findings: Yes

Recommendation: Medical oncology referral

At the request of the patient, I spoke with her by telephone on
06/28/2019 at [DATE]. She reports doing well after the biopsy . We
discussed the pathology results and plan.



Using sterile technique and 1% Lidocaine as local anesthetic, under
direct ultrasound visualization, a 12 gauge Geetha device was
used to perform biopsy of enlarged lymph node in the LOWER RIGHT
axilla using a LATERAL to MEDIAL approach. At the conclusion of the
procedure spiral HydroMARK tissue marker clip was deployed into the
biopsy cavity. Follow up 2 view mammogram was performed and dictated
separately.
IMPRESSION: Ultrasound guided biopsy of RIGHT axillary lymph node. No apparent
complications.

## 2021-09-06 NOTE — Progress Notes (Signed)
? ? ?HEMATOLOGY/ONCOLOGY CLINIC NOTE ? ?Date of Service: 09/06/2021 ? ? ?Patient Care Team: ?[name], [name], MD as PCP - General (Family Medicine) ?Alda Berthold, DO as Consulting Physician (Neurology) ? 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 ?HISTORY OF PRESENTING ILLNESS:  (07/16/2019) ? ?[name] is a wonderful 61 y.o. 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 The pt reports that she is doing well overall.  ? ?The pt reports that her axillary lymphadenopathy was picked up during a routing Mammogram on 05/31/2019. Pt denies feeling any lumps or bumps in either axillary regions. Pt has felt the same for the last 6-12 months.  ? ?She has HTN and Diabetes. Her HTN is currently well-controlled but her Diabetes has been harder to control. Pt notes that "sometimes it's up and sometimes it's down". 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 She feels that the reaction on her left side is a little slower than her right side, but nothing that's noticeable. She denies any weakness, vision changes, or other residual symptoms from either stroke. 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 Pt made a full recovery and has no remaining symptoms. Her eczema is seasonal and not very bothersome. She denies any concerns for frequent infections. Pt has neuropathy in her hands/fingers and has been prescribed Gabapentin, which is helping.  ? ?Pt does not drink much alcohol and has never been a smoker. Her father passed from [place] and had a history of smoking. Her mother passed from a stroke last spring. 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 She is living alone and has one sister in the area.  ? ?Pt has had her first dose of the  COVID19 vaccine and tolerated it well. She has the second dose scheduled.  ? ?Of note prior to the patient's visit today, pt has had Flow Pathology ([id number]) completed on 06/26/2019 with results revealing "Monoclonal B-cell population identified." ? ?Pt has had Right Axilla LN Bx ([id number]) completed on 06/26/2019 with results revealing "NON-HODGKIN B-CELL LYMPHOMA"  ? ?Pt has also had Mammogram ([id number]) completed on 05/31/2019 with results revealing "Further evaluation is suggested for prominent lymph nodes in the bilateral axilla." ? ?Most recent lab results (03/13/2019) of CBC and CMP is as follows: all values are WNL except for RBC at 5.61, MCV at 78.8, MCH at 25.8, Sodium at 131, Glucose at 502, BUN at 24. ? ?On review of systems, pt reports hard stools, tingling/numbness in fingers/hands and denies weakness, vision changes, fevers, chills, night sweats, SOB, fatigue, constipation, diarrhea and any other symptoms.  ? ?On PMHx the pt reports Guillain-Barr? syndrome, Type II Diabetes, Atopic Eczema, HLD, HTN, Stroke x2, Neuropathy. ?On Social Hx the pt reports that she does not drink much alcohol and is a non-smoker. ? ?INTERVAL HISTORY: (09/06/2021) ? ? ?[name] is a wonderful 61 y.o. female who is here for continued evaluation and management of her small lymphocytic lymphoma. ?She was last seen by [place] about 6 months ago. ?She notes continued sense of imbalance and intermittent dizziness related to her previous CVAs.  She has followed with neurology and they noted this might remain chronic. ?She has also seen the dermatologist for a crusty raised lesion on her right leg posteriorly which  she notes was noted to be is slow-growing skin cancer.  She has a follow-up with dermatology in June for surgical excision.  She was unable to tell me if this was a squamous cell carcinoma or basal cell carcinoma.  She also has a few hyperpigmented raised lesions left leg which she notes are being evaluated by  her dermatologist. ? ?She notes no significant new lumps or bumps.  No fevers no chills no night sweats no unexpected weight loss. ?No significant new fatigue.  Has been keeping herself busy with senior activities. ? ?Labs done today were reviewed with her in detail ?MEDICAL HISTORY:  ?Past Medical History:  ?Diagnosis Date  ? Abnormality of gait 05/10/2010  ? BACK PAIN 11/14/2008  ? Class 1 obesity due to excess calories with body mass index (BMI) of 31.0 to 31.9 in adult 02/07/2021  ? DIABETES MELLITUS, TYPE II 07/15/2008  ? Diplopia 07/15/2008  ? ECZEMA, ATOPIC 04/03/2009  ? Guillain-Barre ([place]) 1988  ? HYPERLIPIDEMIA 03/06/2009  ? HYPERTENSION 07/15/2008  ? Stroke [facility] 2010, 2011  ? x2   ? Vertebral artery stenosis   ? ? ?SURGICAL HISTORY: ?Past Surgical History:  ?Procedure Laterality Date  ? ABDOMINAL HYSTERECTOMY    ? DILATION AND CURETTAGE OF UTERUS    ? FOOT SURGERY    ? ? ?SOCIAL HISTORY: ?Social History  ? ?Socioeconomic History  ? Marital status: Single  ?  Spouse name: Not on file  ? Number of children: 0  ? Years of education: Not on file  ? Highest education level: Not on file  ?Occupational History  ? Occupation: custodian at [employer]	[place]  ?Tobacco Use  ? Smoking status: Never  ? Smokeless tobacco: Never  ?Vaping Use  ? Vaping Use: Never used  ?Substance and Sexual Activity  ? Alcohol use: No  ? Drug use: No  ? Sexual activity: Not on file  ?Other Topics Concern  ? Not on file  ?Social History Narrative  ? Right Handed   ? Lives in a one story home   ? ?Social Determinants of Health  ? ?Financial Resource Strain: Not on file  ?Food Insecurity: Not on file  ?Transportation Needs: Not on file  ?Physical Activity: Not on file  ?Stress: Not on file  ?Social Connections: Not on file  ?Intimate Partner Violence: Not on file  ? ? ?FAMILY HISTORY: ?Family History  ?Problem Relation Age of Onset  ? Diabetes Sister   ? Asthma Other   ? Stroke Other   ? Hypertension Other   ? Diabetes Mother   ? Stroke  Mother   ? Cancer Father   ?     pt states hae had some kind of stomach cancer, ? stomach or colon  ? Breast cancer Neg Hx   ? ? ?ALLERGIES:  has No Known Allergies. ? ?MEDICATIONS:  ?Current Outpatient Medications  ?Medication Sig Dispense Refill  ? acetaminophen (TYLENOL) 500 MG tablet Take 2 tablets (1,000 mg total) by mouth every 6 (six) hours as needed. 30 tablet 0  ? amLODipine (NORVASC) 10 MG tablet Take 1 tablet by mouth once daily 30 tablet 3  ? atorvastatin (LIPITOR) 80 MG tablet Take 1 tablet (80 mg total) by mouth daily. 30 tablet 0  ? Blood Pressure Monitoring (BLOOD PRESSURE MONITOR AUTOMAT) DEVI 1 Device by Does not apply route daily. 1 Device 0  ? cilostazol (PLETAL) 50 MG tablet Take 1 tablet (50 mg total) by mouth 2 (two) times daily. 180 tablet 1  ?  clobetasol ointment (TEMOVATE) 0.05 % Apply topically at bedtime.    ? Continuous Blood Gluc Sensor (FREESTYLE LIBRE 2 SENSOR) MISC 1 Device by Does not apply route every 14 (fourteen) days. 6 each 3  ? FLUoxetine (PROZAC) 20 MG capsule Take 1 capsule by mouth once daily 90 capsule 0  ? fluticasone (FLONASE) 50 MCG/ACT nasal spray Place 1 spray into both nostrils 2 (two) times daily. 16 g 3  ? glucose blood (CONTOUR NEXT TEST) test strip 1 each by Other route 2 (two) times daily. And lancets 2/day 200 each 3  ? glucose blood (ONETOUCH VERIO) test strip USE TO CHECK BLOOD SUGAR TWICE A DAY AND PRN 100 each 6  ? hydrALAZINE (APRESOLINE) 50 MG tablet Take 1 tablet (50 mg total) by mouth 3 (three) times daily. 90 tablet 1  ? insulin glargine (LANTUS) 100 UNIT/ML Solostar Pen Inject 160 Units into the skin every morning. And pen needles 1/day 165 mL 3  ? lisinopril-hydrochlorothiazide (ZESTORETIC) 20-12.5 MG tablet Take 2 tablets by mouth once daily 60 tablet 3  ? metoprolol succinate (TOPROL-XL) 50 MG 24 hr tablet TAKE 1 TABLET BY MOUTH ONCE DAILY WITH MEALS OR  IMMEDIATELY  FOLLOWING  A  MEAL 30 tablet 3  ? ONE TOUCH LANCETS MISC USE TO CHECK BLOOD SUGAR  TWICE A DAY AND PRN 100 each 6  ? pantoprazole (PROTONIX) 40 MG tablet Take 1 tablet (40 mg total) by mouth daily. 30 tablet 3  ? topiramate (TOPAMAX) 25 MG tablet Take 1 tablet (25 mg total) by mouth

## 2021-09-09 ENCOUNTER — Telehealth: Payer: Self-pay | Admitting: Hematology

## 2021-09-09 NOTE — Telephone Encounter (Signed)
Left message with follow-up appointment per 5/8 los. ?

## 2021-09-10 LAB — HGB FRACTIONATION CASCADE
Hgb A2: 3.1 % (ref 1.8–3.2)
Hgb A: 61.7 % — ABNORMAL LOW (ref 96.4–98.8)
Hgb F: 0 % (ref 0.0–2.0)
Hgb S: 35.2 % — ABNORMAL HIGH

## 2021-09-10 LAB — HGB SOLUBILITY: Hgb Solubility: POSITIVE — AB

## 2021-09-15 ENCOUNTER — Inpatient Hospital Stay (HOSPITAL_BASED_OUTPATIENT_CLINIC_OR_DEPARTMENT_OTHER)
Admission: EM | Admit: 2021-09-15 | Discharge: 2021-09-17 | DRG: 065 | Disposition: A | Discharge status: Home Health | Payer: 59 | Attending: Family Medicine | Admitting: Family Medicine

## 2021-09-15 ENCOUNTER — Other Ambulatory Visit: Payer: Self-pay

## 2021-09-15 ENCOUNTER — Encounter (HOSPITAL_BASED_OUTPATIENT_CLINIC_OR_DEPARTMENT_OTHER): Payer: Self-pay

## 2021-09-15 ENCOUNTER — Emergency Department (HOSPITAL_BASED_OUTPATIENT_CLINIC_OR_DEPARTMENT_OTHER): Payer: 59

## 2021-09-15 ENCOUNTER — Observation Stay (HOSPITAL_COMMUNITY): Payer: 59

## 2021-09-15 ENCOUNTER — Emergency Department (HOSPITAL_COMMUNITY): Payer: 59

## 2021-09-15 DIAGNOSIS — I739 Peripheral vascular disease, unspecified: Secondary | ICD-10-CM | POA: Diagnosis present

## 2021-09-15 DIAGNOSIS — I10 Essential (primary) hypertension: Secondary | ICD-10-CM | POA: Diagnosis present

## 2021-09-15 DIAGNOSIS — Z833 Family history of diabetes mellitus: Secondary | ICD-10-CM | POA: Diagnosis not present

## 2021-09-15 DIAGNOSIS — Z90710 Acquired absence of both cervix and uterus: Secondary | ICD-10-CM

## 2021-09-15 DIAGNOSIS — Z794 Long term (current) use of insulin: Secondary | ICD-10-CM | POA: Diagnosis not present

## 2021-09-15 DIAGNOSIS — E785 Hyperlipidemia, unspecified: Secondary | ICD-10-CM | POA: Diagnosis present

## 2021-09-15 DIAGNOSIS — Z823 Family history of stroke: Secondary | ICD-10-CM

## 2021-09-15 DIAGNOSIS — E1169 Type 2 diabetes mellitus with other specified complication: Secondary | ICD-10-CM | POA: Diagnosis not present

## 2021-09-15 DIAGNOSIS — K219 Gastro-esophageal reflux disease without esophagitis: Secondary | ICD-10-CM | POA: Diagnosis present

## 2021-09-15 DIAGNOSIS — Z6834 Body mass index (BMI) 34.0-34.9, adult: Secondary | ICD-10-CM | POA: Diagnosis not present

## 2021-09-15 DIAGNOSIS — N1831 Chronic kidney disease, stage 3a: Secondary | ICD-10-CM | POA: Diagnosis not present

## 2021-09-15 DIAGNOSIS — E1122 Type 2 diabetes mellitus with diabetic chronic kidney disease: Secondary | ICD-10-CM | POA: Diagnosis present

## 2021-09-15 DIAGNOSIS — I651 Occlusion and stenosis of basilar artery: Secondary | ICD-10-CM | POA: Diagnosis present

## 2021-09-15 DIAGNOSIS — C8581 Other specified types of non-Hodgkin lymphoma, lymph nodes of head, face, and neck: Secondary | ICD-10-CM | POA: Diagnosis not present

## 2021-09-15 DIAGNOSIS — Z20822 Contact with and (suspected) exposure to covid-19: Secondary | ICD-10-CM | POA: Diagnosis present

## 2021-09-15 DIAGNOSIS — Z79899 Other long term (current) drug therapy: Secondary | ICD-10-CM

## 2021-09-15 DIAGNOSIS — I129 Hypertensive chronic kidney disease with stage 1 through stage 4 chronic kidney disease, or unspecified chronic kidney disease: Secondary | ICD-10-CM | POA: Diagnosis not present

## 2021-09-15 DIAGNOSIS — E11649 Type 2 diabetes mellitus with hypoglycemia without coma: Secondary | ICD-10-CM | POA: Diagnosis not present

## 2021-09-15 DIAGNOSIS — C8300 Small cell B-cell lymphoma, unspecified site: Secondary | ICD-10-CM | POA: Diagnosis present

## 2021-09-15 DIAGNOSIS — I63213 Cerebral infarction due to unspecified occlusion or stenosis of bilateral vertebral arteries: Secondary | ICD-10-CM | POA: Diagnosis not present

## 2021-09-15 DIAGNOSIS — R29701 NIHSS score 1: Secondary | ICD-10-CM | POA: Diagnosis present

## 2021-09-15 DIAGNOSIS — R42 Dizziness and giddiness: Secondary | ICD-10-CM

## 2021-09-15 DIAGNOSIS — E1151 Type 2 diabetes mellitus with diabetic peripheral angiopathy without gangrene: Secondary | ICD-10-CM | POA: Diagnosis present

## 2021-09-15 DIAGNOSIS — Z8673 Personal history of transient ischemic attack (TIA), and cerebral infarction without residual deficits: Secondary | ICD-10-CM

## 2021-09-15 DIAGNOSIS — R471 Dysarthria and anarthria: Secondary | ICD-10-CM | POA: Diagnosis present

## 2021-09-15 DIAGNOSIS — I639 Cerebral infarction, unspecified: Secondary | ICD-10-CM | POA: Diagnosis not present

## 2021-09-15 DIAGNOSIS — E1140 Type 2 diabetes mellitus with diabetic neuropathy, unspecified: Secondary | ICD-10-CM | POA: Diagnosis present

## 2021-09-15 DIAGNOSIS — Z809 Family history of malignant neoplasm, unspecified: Secondary | ICD-10-CM

## 2021-09-15 DIAGNOSIS — E669 Obesity, unspecified: Secondary | ICD-10-CM | POA: Diagnosis not present

## 2021-09-15 DIAGNOSIS — E041 Nontoxic single thyroid nodule: Secondary | ICD-10-CM | POA: Diagnosis not present

## 2021-09-15 DIAGNOSIS — F32A Depression, unspecified: Secondary | ICD-10-CM | POA: Diagnosis not present

## 2021-09-15 DIAGNOSIS — Z8249 Family history of ischemic heart disease and other diseases of the circulatory system: Secondary | ICD-10-CM

## 2021-09-15 DIAGNOSIS — E1165 Type 2 diabetes mellitus with hyperglycemia: Secondary | ICD-10-CM | POA: Diagnosis not present

## 2021-09-15 DIAGNOSIS — E119 Type 2 diabetes mellitus without complications: Secondary | ICD-10-CM | POA: Diagnosis present

## 2021-09-15 DIAGNOSIS — E162 Hypoglycemia, unspecified: Secondary | ICD-10-CM

## 2021-09-15 DIAGNOSIS — H532 Diplopia: Secondary | ICD-10-CM | POA: Diagnosis present

## 2021-09-15 DIAGNOSIS — R4182 Altered mental status, unspecified: Secondary | ICD-10-CM | POA: Diagnosis present

## 2021-09-15 DIAGNOSIS — R590 Localized enlarged lymph nodes: Secondary | ICD-10-CM

## 2021-09-15 HISTORY — DX: Gastro-esophageal reflux disease without esophagitis: K21.9

## 2021-09-15 LAB — CBC WITH DIFFERENTIAL/PLATELET
Abs Immature Granulocytes: 0.06 10*3/uL (ref 0.00–0.07)
Basophils Absolute: 0 10*3/uL (ref 0.0–0.1)
Basophils Relative: 0 %
Eosinophils Absolute: 0.1 10*3/uL (ref 0.0–0.5)
Eosinophils Relative: 1 %
HCT: 36.8 % (ref 36.0–46.0)
Hemoglobin: 11.9 g/dL — ABNORMAL LOW (ref 12.0–15.0)
Immature Granulocytes: 1 %
Lymphocytes Relative: 32 %
Lymphs Abs: 2.5 10*3/uL (ref 0.7–4.0)
MCH: 25.4 pg — ABNORMAL LOW (ref 26.0–34.0)
MCHC: 32.3 g/dL (ref 30.0–36.0)
MCV: 78.5 fL — ABNORMAL LOW (ref 80.0–100.0)
Monocytes Absolute: 1 10*3/uL (ref 0.1–1.0)
Monocytes Relative: 13 %
Neutro Abs: 4.2 10*3/uL (ref 1.7–7.7)
Neutrophils Relative %: 53 %
Platelets: 292 10*3/uL (ref 150–400)
RBC: 4.69 MIL/uL (ref 3.87–5.11)
RDW: 14.6 % (ref 11.5–15.5)
WBC: 7.9 10*3/uL (ref 4.0–10.5)
nRBC: 0 % (ref 0.0–0.2)

## 2021-09-15 LAB — BASIC METABOLIC PANEL
Anion gap: 10 (ref 5–15)
BUN: 27 mg/dL — ABNORMAL HIGH (ref 8–23)
CO2: 25 mmol/L (ref 22–32)
Calcium: 10 mg/dL (ref 8.9–10.3)
Chloride: 103 mmol/L (ref 98–111)
Creatinine, Ser: 1.13 mg/dL — ABNORMAL HIGH (ref 0.44–1.00)
GFR, Estimated: 55 mL/min — ABNORMAL LOW (ref >60)
Glucose, Bld: 112 mg/dL — ABNORMAL HIGH (ref 70–99)
Potassium: 4 mmol/L (ref 3.5–5.1)
Sodium: 138 mmol/L (ref 135–145)

## 2021-09-15 LAB — CBG MONITORING, ED
Glucose-Capillary: 94 mg/dL (ref 70–99)
Glucose-Capillary: 45 mg/dL — ABNORMAL LOW (ref 70–99)

## 2021-09-15 MED ORDER — ASPIRIN 325 MG PO TABS: 325.0000 mg | ORAL_TABLET | Freq: Every day | ORAL | Status: DC

## 2021-09-15 MED ORDER — CLOPIDOGREL BISULFATE 75 MG PO TABS: 75.0000 mg | ORAL_TABLET | Freq: Every day | ORAL | Status: DC

## 2021-09-15 MED ORDER — FLUOXETINE HCL 20 MG PO CAPS
20.0000 mg | ORAL_CAPSULE | Freq: Every day | ORAL | Status: DC
  Administered 2021-09-16 – 2021-09-17 (×2): 20 mg via ORAL
  Filled 2021-09-15 (×2): qty 1

## 2021-09-15 MED ORDER — MECLIZINE HCL 25 MG PO TABS
25.0000 mg | ORAL_TABLET | Freq: Once | ORAL | Status: AC
  Administered 2021-09-15: 25 mg via ORAL
  Filled 2021-09-15: qty 1

## 2021-09-15 MED ORDER — ASPIRIN 300 MG RE SUPP: 300.0000 mg | Freq: Every day | RECTAL | Status: DC

## 2021-09-15 MED ORDER — ASPIRIN 325 MG PO TABS
325.0000 mg | ORAL_TABLET | Freq: Every day | ORAL | Status: DC
  Administered 2021-09-16 (×2): 325 mg via ORAL
  Filled 2021-09-15 (×2): qty 1

## 2021-09-15 MED ORDER — PANTOPRAZOLE SODIUM 40 MG PO TBEC
40.0000 mg | DELAYED_RELEASE_TABLET | Freq: Every day | ORAL | Status: DC
  Administered 2021-09-16 – 2021-09-17 (×2): 40 mg via ORAL
  Filled 2021-09-15 (×2): qty 1

## 2021-09-15 MED ORDER — IOHEXOL 350 MG/ML SOLN
75.0000 mL | Freq: Once | INTRAVENOUS | Status: AC | PRN
  Administered 2021-09-15: 75 mL via INTRAVENOUS

## 2021-09-15 MED ORDER — ACETAMINOPHEN 160 MG/5ML PO SOLN: 650.0000 mg | ORAL | Status: DC | PRN

## 2021-09-15 MED ORDER — ATORVASTATIN CALCIUM 80 MG PO TABS
80.0000 mg | ORAL_TABLET | Freq: Every day | ORAL | Status: DC
Start: 2021-09-16 — End: 2021-09-18
  Administered 2021-09-16 – 2021-09-17 (×2): 80 mg via ORAL
  Filled 2021-09-15 (×2): qty 1

## 2021-09-15 MED ORDER — SENNOSIDES-DOCUSATE SODIUM 8.6-50 MG PO TABS: 1.0000 | ORAL_TABLET | Freq: Every evening | ORAL | Status: DC | PRN

## 2021-09-15 MED ORDER — STROKE: EARLY STAGES OF RECOVERY BOOK
Freq: Once | Status: AC
Start: 2021-09-15 — End: 2021-09-16
  Filled 2021-09-15: qty 1

## 2021-09-15 MED ORDER — ACETAMINOPHEN 650 MG RE SUPP: 650.0000 mg | RECTAL | Status: DC | PRN

## 2021-09-15 MED ORDER — INSULIN GLARGINE-YFGN 100 UNIT/ML ~~LOC~~ SOLN: 50.0000 [IU] | Freq: Every day | SUBCUTANEOUS | Status: DC

## 2021-09-15 MED ORDER — INSULIN ASPART 100 UNIT/ML IJ SOLN: 0.0000 [IU] | Freq: Three times a day (TID) | INTRAMUSCULAR | Status: DC

## 2021-09-15 MED ORDER — ENOXAPARIN SODIUM 40 MG/0.4ML IJ SOSY
40.0000 mg | PREFILLED_SYRINGE | INTRAMUSCULAR | Status: DC
  Administered 2021-09-15 – 2021-09-16 (×2): 40 mg via SUBCUTANEOUS
  Filled 2021-09-15 (×2): qty 0.4

## 2021-09-15 MED ORDER — ACETAMINOPHEN 325 MG PO TABS
650.0000 mg | ORAL_TABLET | ORAL | Status: DC | PRN
  Administered 2021-09-17: 650 mg via ORAL
  Filled 2021-09-15: qty 2

## 2021-09-15 NOTE — H&P (Signed)
?History and Physical  ? ?[name]:[id number] DOB: 05/23/1960 DOA: 09/15/2021 ? ?PCP: [name], [name], MD  ? ?Patient coming from: Home ? ?Chief Complaint: Unstable gait/dizziness ? ?HPI: [name] is a 61 y.o. female with medical history significant of obesity, diabetes, hyperlipidemia, hypertension, small cell lymphocytic impairment, peripheral arterial disease, GERD, CVA presenting with unsteady gait/dizziness. ? ?Patient states that her symptoms started around noon today and felt normal prior to this.  Says it feels like when she is walking she is walking backwards or leaning to the side.  Denies any falls.  History of vertigo and stroke.  Tried home meclizine with no improvement.  No other focal deficits noted. ? ?Denies fevers, chills, chest pain, shortness of breath, abdominal pain, constipation or diarrhea, nausea, vomiting. ? ?ED Course: Vital signs in the ED significant for heart rate in the 50s and blood pressure in the 767M to 094B systolic.  Lab work-up included BMP with BUN 27 creatinine stable at 1.13, glucose 112.  Subsequent CBG showed blood sugar 145 which improved with juice.  CBC with hemoglobin 11.9 which is stable.  CT head showed no acute normality.  MR brain showed acute infarct bilateral cerebellum.  CTA head and neck pending.  Patient given dose of meclizine in the ED.  Neurology was consulted recommended stroke work-up and permissive hypertension. ? ?Review of Systems: As per HPI otherwise all other systems reviewed and are negative. ? ?Past Medical History:  ?Diagnosis Date  ? Abnormality of gait 05/10/2010  ? BACK PAIN 11/14/2008  ? Class 1 obesity due to excess calories with body mass index (BMI) of 31.0 to 31.9 in adult 02/07/2021  ? DIABETES MELLITUS, TYPE II 07/15/2008  ? Diplopia 07/15/2008  ? ECZEMA, ATOPIC 04/03/2009  ? Guillain-Barre ([place]) 1988  ? HYPERLIPIDEMIA 03/06/2009  ? HYPERTENSION 07/15/2008  ? Stroke [facility]) 2010, 2011  ? x2   ? Vertebral artery stenosis    ? ? ?Past Surgical History:  ?Procedure Laterality Date  ? ABDOMINAL HYSTERECTOMY    ? DILATION AND CURETTAGE OF UTERUS    ? FOOT SURGERY    ? ? ?Social History ? reports that she has never smoked. She has never used smokeless tobacco. She reports that she does not drink alcohol and does not use drugs. ? ?No Known Allergies ? ?Family History  ?Problem Relation Age of Onset  ? Diabetes Sister   ? Asthma Other   ? Stroke Other   ? Hypertension Other   ? Diabetes Mother   ? Stroke Mother   ? Cancer Father   ?     pt states hae had some kind of stomach cancer, ? stomach or colon  ? Breast cancer Neg Hx   ?Reviewed on admission ? ?Prior to Admission medications   ?Medication Sig Start Date End Date Taking? Authorizing Provider  ?acetaminophen (TYLENOL) 500 MG tablet Take 2 tablets (1,000 mg total) by mouth every 6 (six) hours as needed. 11/19/19   [name], MD  ?amLODipine (NORVASC) 10 MG tablet Take 1 tablet by mouth once daily 09/01/21   [name], [name], MD  ?atorvastatin (LIPITOR) 80 MG tablet Take 1 tablet (80 mg total) by mouth daily. 02/08/21 02/08/22  [name], [name], MD  ?Blood Pressure Monitoring (BLOOD PRESSURE MONITOR AUTOMAT) DEVI 1 Device by Does not apply route daily. 08/29/18   [name], [name], MD  ?cilostazol (PLETAL) 50 MG tablet Take 1 tablet (50 mg total) by mouth 2 (two) times daily. 04/13/21   [name],  [name], MD  ?clobetasol ointment (TEMOVATE) 0.05 % Apply topically at bedtime. 03/04/21   [provider]  ?Continuous Blood Gluc Sensor (FREESTYLE LIBRE 2 SENSOR) MISC 1 Device by Does not apply route every 14 (fourteen) days. 02/09/21   [name], MD  ?FLUoxetine (PROZAC) 20 MG capsule Take 1 capsule by mouth once daily 09/01/21   [name], [name], MD  ?fluticasone [facility]) 50 MCG/ACT nasal spray Place 1 spray into both nostrils 2 (two) times daily. 02/15/21   [name], [name], MD  ?glucose blood (CONTOUR NEXT TEST) test strip 1 each by Other route 2 (two) times daily. And lancets  2/day 03/09/18   [name], MD  ?glucose blood [facility] VERIO) test strip USE TO CHECK BLOOD SUGAR TWICE A DAY AND PRN 07/29/15   [name], MD  ?hydrALAZINE (APRESOLINE) 50 MG tablet Take 1 tablet (50 mg total) by mouth 3 (three) times daily. 03/01/21   [name], [name], MD  ?insulin glargine (LANTUS) 100 UNIT/ML Solostar Pen Inject 160 Units into the skin every morning. And pen needles 1/day 05/04/21   [name], [name], MD  ?lisinopril-hydrochlorothiazide (ZESTORETIC) 20-12.5 MG tablet Take 2 tablets by mouth once daily 09/01/21   [name], [name], MD  ?metoprolol succinate (TOPROL-XL) 50 MG 24 hr tablet TAKE 1 TABLET BY MOUTH ONCE DAILY WITH MEALS OR  IMMEDIATELY  FOLLOWING  A  MEAL 09/01/21   [name], [name], MD  ?ONE TOUCH LANCETS MISC USE TO CHECK BLOOD SUGAR TWICE A DAY AND PRN 07/29/15   [name], MD  ?pantoprazole (PROTONIX) 40 MG tablet Take 1 tablet (40 mg total) by mouth daily. 08/30/21   [name], [name], MD  ?topiramate (TOPAMAX) 25 MG tablet Take 1 tablet (25 mg total) by mouth 2 (two) times daily. 02/08/21 07/23/21  [name], [name], MD  ?triamcinolone ointment (KENALOG) 0.5 % Apply topically 2 (two) times daily as needed. 03/01/21   [name], [name], MD  ? ? ?Physical Exam: ?Vitals:  ? 09/15/21 1830 09/15/21 1900 09/15/21 1930 09/15/21 2045  ?BP: (!) 154/64 (!) 179/85 (!) 143/104 (!) 191/73  ?Pulse: (!) 52 (!) 50 (!) 50 (!) 54  ?Resp: '16 16 16 18  '$ ?Temp:   98.1 ?F (36.7 ?C) 98.8 ?F (37.1 ?C)  ?TempSrc:   Oral   ?SpO2: 96% 100% 100% 100%  ? ? ?Physical Exam ?Constitutional:   ?   General: She is not in acute distress. ?   Appearance: Normal appearance.  ?HENT:  ?   Head: Normocephalic and atraumatic.  ?   Mouth/Throat:  ?   Mouth: Mucous membranes are moist.  ?   Pharynx: Oropharynx is clear.  ?Eyes:  ?   Extraocular Movements: Extraocular movements intact.  ?   Pupils: Pupils are equal, round, and reactive to light.  ?Cardiovascular:  ?   Rate and Rhythm: Normal rate and regular  rhythm.  ?   Pulses: Normal pulses.  ?   Heart sounds: Normal heart sounds.  ?Pulmonary:  ?   Effort: Pulmonary effort is normal. No respiratory distress.  ?   Breath sounds: Normal breath sounds.  ?Abdominal:  ?   General: Bowel sounds are normal. There is no distension.  ?   Palpations: Abdomen is soft.  ?   Tenderness: There is no abdominal tenderness.  ?Musculoskeletal:     ?   General: No swelling or deformity.  ?Skin: ?   General: Skin is warm and dry.  ?Neurological:  ?  Mental Status: Mental status is at baseline.  ?   Comments: Mental Status: ?Patient is awake, alert, oriented x3 ?No signs of aphasia or neglect ?Cranial Nerves: ?II: Pupils equal, round, and reactive to light.   ?III,IV, VI: EOMI without ptosis or diploplia.  ?V: Facial sensation is symmetric to light touch. ?VII: Facial movement is symmetric.  ?VIII: hearing is intact to voice ?X: Uvula elevates symmetrically ?XI: Shoulder shrug is symmetric. ?XII: tongue is midline without atrophy or fasciculations.  ?Motor: Good effort thorughout, at Least 5/5 bilateral UE, 5/5 bilateral lower extremitiy  ?Sensory: Sensation is grossly intact bilateral UEs & LEs  ? ?Labs on Admission: I have personally reviewed following labs and imaging studies ? ?CBC: ?Recent Labs  ?Lab 09/15/21 ?1802  ?WBC 7.9  ?NEUTROABS 4.2  ?HGB 11.9*  ?HCT 36.8  ?MCV 78.5*  ?PLT 292  ? ? ?Basic Metabolic Panel: ?Recent Labs  ?Lab 09/15/21 ?1802  ?NA 138  ?K 4.0  ?CL 103  ?CO2 25  ?GLUCOSE 112*  ?BUN 27*  ?CREATININE 1.13*  ?CALCIUM 10.0  ? ? ?GFR: ?Estimated Creatinine Clearance: 55.5 mL/min (A) (by C-G formula based on SCr of 1.13 mg/dL (H)). ? ?Liver Function Tests: ?No results for input(s): AST, ALT, ALKPHOS, BILITOT, PROT, ALBUMIN in the last 168 hours. ? ?Urine analysis: ?   ?Component Value Date/Time  ? COLORURINE YELLOW 02/06/2021 1354  ? APPEARANCEUR CLOUDY (A) 02/06/2021 1354  ? LABSPEC 1.013 02/06/2021 1354  ? PHURINE 5.0 02/06/2021 1354  ? GLUCOSEU >=500 (A) 02/06/2021  1354  ? HGBUR MODERATE (A) 02/06/2021 1354  ? HGBUR negative 03/12/2010 0847  ? BILIRUBINUR NEGATIVE 02/06/2021 1354  ? BILIRUBINUR n 09/11/2015 1039  ? KETONESUR NEGATIVE 02/06/2021 1354  ? PROTEINUR >=300 (A

## 2021-09-15 NOTE — ED Notes (Signed)
Pt only complaint is dizziness that started last night around 1800hrs. ?

## 2021-09-15 NOTE — ED Provider Notes (Signed)
This is a 61 year old female who was recently transferred from [facility] ER after being seen by Dr. Thamas Jaegers for evaluation of dizziness.  Please see his note for complete H&P.  Patient reported unsteady gait and dizziness since yesterday.  She was evaluated and had CT scan of the head that was unremarkable, was given meclizine without any improvement and she has been sent here for brain MRI to rule out acute stroke.  I have independently reviewed and interpreted patient's labs and imaging and agree with radiologist interpretation.  Labs showing mild AKI with a creatinine of 1.13.  Head CT scan without any acute intracranial pathology.  Normal CBG.  Blood pressure is noted to be hypertensive with blood pressure of 191/73.  At this time, will allow permissive hypertension as we await for the brain MRI.  I have reassessed patient and she is resting comfortably.  10:01 PM Patient request for her blood sugar to be checked.  Nurse checked blood sugar and noted to be 45.  Patient requesting for crackers and juice, will give food and will recheck CBG in 30 minutes.  Brain MRI have resulted and independently viewed interpreted by me and I agree with radiologist interpretation.  There are small foci of subacute ischemia within both cerebellar hemisphere without hemorrhage or mass effect.  Also chronic small vessel ischemia noted.  This MRI finding is consistent with patient's presentation therefore I will consult neurology to notify and will have patient admitted for further care.  She is outside the window for TNK.  10:12 PM I appreciate consultation from on-call neurologist, Dr. Malen Gauze, who have reviewed patient's MRI results and will be involved in patient care.  He request medicine for admission.  He also request to obtain head and neck CT angiogram.  At this time will allow for permissive hypertension.  We will consult medicine team for admission.  Patient made aware of findings.  10:37  PM Appreciate consultation from [facility] hospitalist, Dr. Trilby Drummer who agrees to see and will admit patient for further care.  We will give patient aspirin as well.  This patient presents to the ED for concern of dizziness, this involves an extensive number of treatment options, and is a complaint that carries with it a high risk of complications and morbidity.  The differential diagnosis includes posterior circulation stroke, central vertigo, peripheral vertigo, labyrinthitis, OM, mastoiditis, anemia, hypoglycemia, hypovolemia, acs, pe  Co morbidities that complicate the patient evaluation prior stroke  DM Additional history obtained:  Additional history obtained from family member at bedside External records from outside source obtained and reviewed including notes from Dr. Almyra Free, prior neurology note  Lab Tests:  I Ordered, and personally interpreted labs.  The pertinent results include:  as above  Imaging Studies ordered:  I ordered imaging studies including brain MRI I independently visualized and interpreted imaging which showed acute stroke I agree with the radiologist interpretation  Cardiac Monitoring:  The patient was maintained on a cardiac monitor.  I personally viewed and interpreted the cardiac monitored which showed an underlying rhythm of: sinus bradycardia  Medicines ordered and prescription drug management:  I ordered medication including meclizine  for dizziness Reevaluation of the patient after these medicines showed that the patient stayed the same I have reviewed the patients home medicines and have made adjustments as needed  Test Considered: as above  Critical Interventions: stroke work up  Consultations Obtained:  I requested consultation with the neurologist Dr. Rory Percy,  and discussed lab and imaging findings as  well as pertinent plan - they recommend: hospital admission  Problem List / ED Course: acute  stroke  Hypoglycemia  hypertension  Reevaluation:  After the interventions noted above, I reevaluated the patient and found that they have :stayed the same  Social Determinants of Health: depression  Dispostion:  After consideration of the diagnostic results and the patients response to treatment, I feel that the patent would benefit from admission.   .Critical Care Performed by: Domenic Moras, PA-C Authorized by: Domenic Moras, PA-C   Critical care provider statement:    Critical care time (minutes):  30   Critical care was time spent personally by me on the following activities:  Development of treatment plan with patient or surrogate, discussions with consultants, evaluation of patient's response to treatment, examination of patient, ordering and review of laboratory studies, ordering and review of radiographic studies, ordering and performing treatments and interventions, pulse oximetry, re-evaluation of patient's condition and review of old charts  BP (!) 191/73   Pulse (!) 54   Temp 98.8 F (37.1 C)   Resp 18   SpO2 100%   Results for orders placed or performed during the hospital encounter of 09/15/21  CBC with Differential  Result Value Ref Range   WBC 7.9 4.0 - 10.5 K/uL   RBC 4.69 3.87 - 5.11 MIL/uL   Hemoglobin 11.9 (L) 12.0 - 15.0 g/dL   HCT 36.8 36.0 - 46.0 %   MCV 78.5 (L) 80.0 - 100.0 fL   MCH 25.4 (L) 26.0 - 34.0 pg   MCHC 32.3 30.0 - 36.0 g/dL   RDW 14.6 11.5 - 15.5 %   Platelets 292 150 - 400 K/uL   nRBC 0.0 0.0 - 0.2 %   Neutrophils Relative % 53 %   Neutro Abs 4.2 1.7 - 7.7 K/uL   Lymphocytes Relative 32 %   Lymphs Abs 2.5 0.7 - 4.0 K/uL   Monocytes Relative 13 %   Monocytes Absolute 1.0 0.1 - 1.0 K/uL   Eosinophils Relative 1 %   Eosinophils Absolute 0.1 0.0 - 0.5 K/uL   Basophils Relative 0 %   Basophils Absolute 0.0 0.0 - 0.1 K/uL   Immature Granulocytes 1 %   Abs Immature Granulocytes 0.06 0.00 - 0.07 K/uL  Basic metabolic panel  Result  Value Ref Range   Sodium 138 135 - 145 mmol/L   Potassium 4.0 3.5 - 5.1 mmol/L   Chloride 103 98 - 111 mmol/L   CO2 25 22 - 32 mmol/L   Glucose, Bld 112 (H) 70 - 99 mg/dL   BUN 27 (H) 8 - 23 mg/dL   Creatinine, Ser 1.13 (H) 0.44 - 1.00 mg/dL   Calcium 10.0 8.9 - 10.3 mg/dL   GFR, Estimated 55 (L) >60 mL/min   Anion gap 10 5 - 15  POC CBG, ED  Result Value Ref Range   Glucose-Capillary 94 70 - 99 mg/dL  POC CBG, ED  Result Value Ref Range   Glucose-Capillary 45 (L) 70 - 99 mg/dL   CT Head Wo Contrast  Result Date: 09/15/2021 CLINICAL DATA:  Dizziness. EXAM: CT HEAD WITHOUT CONTRAST TECHNIQUE: Contiguous axial images were obtained from the base of the skull through the vertex without intravenous contrast. RADIATION DOSE REDUCTION: This exam was performed according to the departmental dose-optimization program which includes automated exposure control, adjustment of the mA and/or kV according to patient size and/or use of iterative reconstruction technique. COMPARISON:  Head CT dated 04/09/2020. FINDINGS: Brain: The ventricles and sulci  are appropriate size for the patient's age. The gray-white matter discrimination is preserved. There is no acute intracranial hemorrhage. No mass effect or midline shift. No extra-axial fluid collection. Vascular: No hyperdense vessel or unexpected calcification. Skull: Normal. Negative for fracture or focal lesion. Sinuses/Orbits: No acute finding. Other: None IMPRESSION: No acute intracranial pathology. Electronically Signed   By: Anner Crete M.D.   On: 09/15/2021 17:17   MR BRAIN WO CONTRAST  Result Date: 09/15/2021 CLINICAL DATA:  Dizziness EXAM: MRI HEAD WITHOUT CONTRAST TECHNIQUE: Multiplanar, multiecho pulse sequences of the brain and surrounding structures were obtained without intravenous contrast. COMPARISON:  None Available. FINDINGS: Brain: There are small foci of abnormal diffusion restriction within both cerebellar hemispheres. Chronic  microhemorrhage in the right thalamus. There is multifocal hyperintense T2-weighted signal within the white matter. Parenchymal volume and CSF spaces are normal. The midline structures are normal. Vascular: Major flow voids are preserved. Skull and upper cervical spine: Normal calvarium and skull base. Visualized upper cervical spine and soft tissues are normal. Sinuses/Orbits:No paranasal sinus fluid levels or advanced mucosal thickening. No mastoid or middle ear effusion. Normal orbits. IMPRESSION: 1. Small foci of acute ischemia within both cerebellar hemispheres. No hemorrhage or mass effect. 2. Findings of chronic small vessel ischemia. Electronically Signed   By: Ulyses Jarred M.D.   On: 09/15/2021 21:50        Domenic Moras, PA-C 09/15/21 2238    Fredia Sorrow, MD 09/17/21 1734

## 2021-09-15 NOTE — ED Triage Notes (Signed)
She c/o dizziness which began yesterday evening at ~1800 hours. She is ambulatory and in no distress. ?

## 2021-09-15 NOTE — ED Provider Notes (Signed)
?[facility] DEPT ?Provider Note ? ? ?CSN: [id number] ?Arrival date & time: 09/15/21  1520 ? ?  ? ?History ? ?Chief Complaint  ?Patient presents with  ? Dizziness  ? ? ?[name] is a 61 y.o. female. ? ?Patient presented chief complaint of unsteady gait.  She states symptoms started around noon.  She states that she "feels like she is walking backwards or leaning to the side."  She has a history of stroke in the past.  She says she was fine until about noon today.  Otherwise denies any actual fall.  Denies headache chest pain abdominal pain.  Denies fevers cough vomiting or diarrhea.  She does have a history of vertigo as well she states she tried meclizine earlier and it did not work and she presents to the ER now.  Denies any weakness or numbness anywhere else. ? ? ?  ? ?Home Medications ?Prior to Admission medications   ?Medication Sig Start Date End Date Taking? Authorizing Provider  ?acetaminophen (TYLENOL) 500 MG tablet Take 2 tablets (1,000 mg total) by mouth every 6 (six) hours as needed. 11/19/19   [name], MD  ?amLODipine (NORVASC) 10 MG tablet Take 1 tablet by mouth once daily 09/01/21   [name], [name], MD  ?atorvastatin (LIPITOR) 80 MG tablet Take 1 tablet (80 mg total) by mouth daily. 02/08/21 02/08/22  [name], [name], MD  ?Blood Pressure Monitoring (BLOOD PRESSURE MONITOR AUTOMAT) DEVI 1 Device by Does not apply route daily. 08/29/18   [name], [name], MD  ?cilostazol (PLETAL) 50 MG tablet Take 1 tablet (50 mg total) by mouth 2 (two) times daily. 04/13/21   Lorretta Harp, MD  ?clobetasol ointment (TEMOVATE) 0.05 % Apply topically at bedtime. 03/04/21   [provider]  ?Continuous Blood Gluc Sensor (FREESTYLE LIBRE 2 SENSOR) MISC 1 Device by Does not apply route every 14 (fourteen) days. 02/09/21   [name], MD  ?FLUoxetine (PROZAC) 20 MG capsule Take 1 capsule by mouth once daily 09/01/21   [name], [name], MD  ?fluticasone [facility]) 50 MCG/ACT  nasal spray Place 1 spray into both nostrils 2 (two) times daily. 02/15/21   [name], [name], MD  ?glucose blood (CONTOUR NEXT TEST) test strip 1 each by Other route 2 (two) times daily. And lancets 2/day 03/09/18   [name], MD  ?glucose blood [facility] VERIO) test strip USE TO CHECK BLOOD SUGAR TWICE A DAY AND PRN 07/29/15   [name], MD  ?hydrALAZINE (APRESOLINE) 50 MG tablet Take 1 tablet (50 mg total) by mouth 3 (three) times daily. 03/01/21   [name], [name], MD  ?insulin glargine (LANTUS) 100 UNIT/ML Solostar Pen Inject 160 Units into the skin every morning. And pen needles 1/day 05/04/21   [name], [name], MD  ?lisinopril-hydrochlorothiazide (ZESTORETIC) 20-12.5 MG tablet Take 2 tablets by mouth once daily 09/01/21   [name], [name], MD  ?metoprolol succinate (TOPROL-XL) 50 MG 24 hr tablet TAKE 1 TABLET BY MOUTH ONCE DAILY WITH MEALS OR  IMMEDIATELY  FOLLOWING  A  MEAL 09/01/21   [name], [name], MD  ?ONE TOUCH LANCETS MISC USE TO CHECK BLOOD SUGAR TWICE A DAY AND PRN 07/29/15   [name], MD  ?pantoprazole (PROTONIX) 40 MG tablet Take 1 tablet (40 mg total) by mouth daily. 08/30/21   [name], [name], MD  ?topiramate (TOPAMAX) 25 MG tablet Take 1 tablet (25 mg total) by mouth 2 (two) times daily. 02/08/21 07/23/21  [name], [name],  MD  ?triamcinolone ointment (KENALOG) 0.5 % Apply topically 2 (two) times daily as needed. 03/01/21   [name], [name], MD  ?   ? ?Allergies    ?Patient has no known allergies.   ? ?Review of Systems   ?Review of Systems  ?Constitutional:  Negative for fever.  ?HENT:  Negative for ear pain.   ?Eyes:  Negative for pain.  ?Respiratory:  Negative for cough.   ?Cardiovascular:  Negative for chest pain.  ?Gastrointestinal:  Negative for abdominal pain.  ?Genitourinary:  Negative for flank pain.  ?Musculoskeletal:  Negative for back pain.  ?Skin:  Negative for rash.  ?Neurological:  Negative for headaches.  ? ?Physical Exam ?Updated Vital Signs ?BP (!) 154/64 (BP  Location: Right Arm)   Pulse (!) 52   Temp 98.3 ?F (36.8 ?C)   Resp 16   SpO2 96%  ?Physical Exam ?Constitutional:   ?   General: She is not in acute distress. ?   Appearance: Normal appearance.  ?HENT:  ?   Head: Normocephalic.  ?   Nose: Nose normal.  ?Eyes:  ?   Extraocular Movements: Extraocular movements intact.  ?Cardiovascular:  ?   Rate and Rhythm: Normal rate.  ?Pulmonary:  ?   Effort: Pulmonary effort is normal.  ?Musculoskeletal:     ?   General: Normal range of motion.  ?   Cervical back: Normal range of motion.  ?   Comments: No C or T or L-spine midline step-offs or tenderness noted.  ?Neurological:  ?   Mental Status: She is alert.  ?   Comments: Cranial nerves II through XII intact.  Strength 5/5 all extremities.  Finger-nose intact heel-to-shin intact.  Gait is unsteady and requiring assistance.  ? ? ?ED Results / Procedures / Treatments   ?Labs ?(all labs ordered are listed, but only abnormal results are displayed) ?Labs Reviewed  ?CBC WITH DIFFERENTIAL/PLATELET - Abnormal; Notable for the following components:  ?    Result Value  ? Hemoglobin 11.9 (*)   ? MCV 78.5 (*)   ? MCH 25.4 (*)   ? All other components within normal limits  ?BASIC METABOLIC PANEL - Abnormal; Notable for the following components:  ? Glucose, Bld 112 (*)   ? BUN 27 (*)   ? Creatinine, Ser 1.13 (*)   ? GFR, Estimated 55 (*)   ? All other components within normal limits  ?CBG MONITORING, ED  ? ? ?EKG ?None ? ?Radiology ?CT Head Wo Contrast ? ?Result Date: 09/15/2021 ?CLINICAL DATA:  Dizziness. EXAM: CT HEAD WITHOUT CONTRAST TECHNIQUE: Contiguous axial images were obtained from the base of the skull through the vertex without intravenous contrast. RADIATION DOSE REDUCTION: This exam was performed according to the departmental dose-optimization program which includes automated exposure control, adjustment of the mA and/or kV according to patient size and/or use of iterative reconstruction technique. COMPARISON:  Head CT dated  04/09/2020. FINDINGS: Brain: The ventricles and sulci are appropriate size for the patient's age. The gray-white matter discrimination is preserved. There is no acute intracranial hemorrhage. No mass effect or midline shift. No extra-axial fluid collection. Vascular: No hyperdense vessel or unexpected calcification. Skull: Normal. Negative for fracture or focal lesion. Sinuses/Orbits: No acute finding. Other: None IMPRESSION: No acute intracranial pathology. Electronically Signed   By: Anner Crete M.D.   On: 09/15/2021 17:17   ? ?Procedures ?Procedures  ? ? ?Medications Ordered in ED ?Medications  ?meclizine (ANTIVERT) tablet 25 mg (25 mg Oral Given 09/15/21 1709)  ? ? ?  ED Course/ Medical Decision Making/ A&P ?  ?                        ?Medical Decision Making ?Amount and/or Complexity of Data Reviewed ?Labs: ordered. ?Radiology: ordered. ? ? ?Cardiac monitoring showing sinus rhythm. ? ?Labs otherwise unremarkable. ? ?CT imaging shows no acute findings.  Patient given meclizine without any change or improvement. ? ?Transferred to [facility] for [facility] of the brain. ? ? ? ? ? ? ? ?Final Clinical Impression(s) / ED Diagnoses ?Final diagnoses:  ?Unsteady gait  ? ? ?Rx / DC Orders ?ED Discharge Orders   ? ? None  ? ?  ? ? ?  ?Luna Fuse, MD ?09/15/21 1859 ? ?

## 2021-09-15 NOTE — ED Notes (Signed)
Pt to MRI

## 2021-09-15 NOTE — Consult Note (Signed)
Neurology Consultation ? ?Reason for Consult: Dizziness, stroke and MRI ?Referring Physician: Domenic Moras, PA-c ? ?CC: Dizziness ? ?History is obtained from: Patient, chart ? ?HPI: [name] is a 61 y.o. 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  Last known well is somewhere around noon yesterday 09/14/2021.  She says that she feels unsteady and feels like she is in a fall.  She also had an episode of diplopia on Sunday.  No tingling numbness or weakness.  No chest pain shortness of breath palpitations. ?No residual symptoms from prior stroke. ?Has a significant history of neuropathy due to her uncontrolled diabetes. ?Follows with Dr. Posey Pronto at [facility] neurology ?Reports compliance to antiplatelets. ?Initially presented to a freestanding ER from which she was transferred here for further imaging which revealed cerebellar strokes-see details below. ?Her outpatient neurology notes reveal high blood pressures although she reports compliance to medications. ? ?LKW: 12 PM on 09/14/2021 ?tpa given?: no, outside the window ?Premorbid modified Rankin scale (mRS): 1 ?ROS: Full ROS was performed and is negative except as noted in the HPI.  ? ?Past Medical History:  ?Diagnosis Date  ? Abnormality of gait 05/10/2010  ? BACK PAIN 11/14/2008  ? Class 1 obesity due to excess calories with body mass index (BMI) of 31.0 to 31.9 in adult 02/07/2021  ? DIABETES MELLITUS, TYPE II 07/15/2008  ? Diplopia 07/15/2008  ? ECZEMA, ATOPIC 04/03/2009  ? Guillain-Barre ([place]) 1988  ? HYPERLIPIDEMIA 03/06/2009  ? HYPERTENSION 07/15/2008  ? Stroke [facility]) 2010, 2011  ? x2   ? Vertebral artery stenosis   ? ?Family  History  ?Problem Relation Age of Onset  ? Diabetes Sister   ? Asthma Other   ? Stroke Other   ? Hypertension Other   ? Diabetes Mother   ? Stroke Mother   ? Cancer Father   ?     pt states hae had some kind of stomach cancer, ? stomach or colon  ? Breast cancer Neg Hx   ? ?Social History:  ? reports that she has never smoked. She has never used smokeless tobacco. She reports that she does not drink alcohol and does not use drugs. ? ?Medications ?No current facility-administered medications for this encounter. ? ?Current Outpatient Medications:  ?  acetaminophen (TYLENOL) 500 MG tablet, Take 2 tablets (1,000 mg total) by mouth every 6 (six) hours as needed., Disp: 30 tablet, Rfl: 0 ?  amLODipine (NORVASC) 10 MG tablet, Take 1 tablet by mouth once daily, Disp: 30 tablet, Rfl: 3 ?  atorvastatin (LIPITOR) 80 MG tablet, Take 1 tablet (80 mg total) by mouth daily., Disp: 30 tablet, Rfl: 0 ?  Blood Pressure Monitoring (BLOOD PRESSURE MONITOR AUTOMAT) DEVI, 1 Device by Does not apply route daily., Disp: 1 Device, Rfl: 0 ?  cilostazol (PLETAL) 50 MG tablet, Take 1 tablet (50 mg total) by mouth 2 (two) times daily., Disp: 180 tablet, Rfl: 1 ?  clobetasol ointment (TEMOVATE) 0.05 %, Apply topically at bedtime., Disp: , Rfl:  ?  Continuous Blood Gluc Sensor (FREESTYLE LIBRE 2 SENSOR) MISC, 1 Device by Does not apply route every 14 (fourteen) days., Disp: 6 each, Rfl: 3 ?  FLUoxetine (PROZAC) 20 MG capsule, Take 1 capsule by  mouth once daily, Disp: 90 capsule, Rfl: 0 ?  fluticasone (FLONASE) 50 MCG/ACT nasal spray, Place 1 spray into both nostrils 2 (two) times daily., Disp: 16 g, Rfl: 3 ?  glucose blood (CONTOUR NEXT TEST) test strip, 1 each by Other route 2 (two) times daily. And lancets 2/day, Disp: 200 each, Rfl: 3 ?  glucose blood (ONETOUCH VERIO) test strip, USE TO CHECK BLOOD SUGAR TWICE A DAY AND PRN, Disp: 100 each, Rfl: 6 ?  hydrALAZINE (APRESOLINE) 50 MG tablet, Take 1 tablet (50 mg total) by mouth 3 (three) times  daily., Disp: 90 tablet, Rfl: 1 ?  insulin glargine (LANTUS) 100 UNIT/ML Solostar Pen, Inject 160 Units into the skin every morning. And pen needles 1/day, Disp: 165 mL, Rfl: 3 ?  lisinopril-hydrochlorothiazide (ZESTORETIC) 20-12.5 MG tablet, Take 2 tablets by mouth once daily, Disp: 60 tablet, Rfl: 3 ?  metoprolol succinate (TOPROL-XL) 50 MG 24 hr tablet, TAKE 1 TABLET BY MOUTH ONCE DAILY WITH MEALS OR  IMMEDIATELY  FOLLOWING  A  MEAL, Disp: 30 tablet, Rfl: 3 ?  ONE TOUCH LANCETS MISC, USE TO CHECK BLOOD SUGAR TWICE A DAY AND PRN, Disp: 100 each, Rfl: 6 ?  pantoprazole (PROTONIX) 40 MG tablet, Take 1 tablet (40 mg total) by mouth daily., Disp: 30 tablet, Rfl: 3 ?  topiramate (TOPAMAX) 25 MG tablet, Take 1 tablet (25 mg total) by mouth 2 (two) times daily., Disp: 60 tablet, Rfl: 0 ?  triamcinolone ointment (KENALOG) 0.5 %, Apply topically 2 (two) times daily as needed., Disp: 30 g, Rfl: 1 ? ? ?Exam: ?Current vital signs: ?BP (!) 191/73   Pulse (!) 54   Temp 98.8 ?F (37.1 ?C)   Resp 18   SpO2 100%  ?Vital signs in last 24 hours: ?Temp:  [98.1 ?F (36.7 ?C)-98.8 ?F (37.1 ?C)] 98.8 ?F (37.1 ?C) (05/17 2045) ?Pulse Rate:  [50-55] 54 (05/17 2045) ?Resp:  [16-18] 18 (05/17 2045) ?BP: (143-191)/(64-104) 191/73 (05/17 2045) ?SpO2:  [96 %-100 %] 100 % (05/17 2045) ?GENERAL: Awake, alert in NAD ?HEENT: - Normocephalic and atraumatic, dry mm, no LN++, no Thyromegally ?LUNGS - Clear to auscultation bilaterally with no wheezes ?CV - S1S2 RRR, no m/r/g, equal pulses bilaterally. ?ABDOMEN - Soft, nontender, nondistended with normoactive BS ?Ext: warm, well perfused, intact peripheral pulses, no edema ? ?NEURO:  ?Mental Status: AA&Ox3  ?Language: speech is mildly dysarthric.  Naming, repetition, fluency, and comprehension intact. ?Cranial Nerves: PERRL EOMI, visual fields full, no facial asymmetry, facial sensation intact, hearing intact, tongue/uvula/soft palate midline, normal sternocleidomastoid and trapezius muscle strength.  No evidence of tongue atrophy or fibrillations ?Motor: No drift in any of the 4 extremities ?Tone: is normal and bulk is normal ?Sensation- Intact to light touch bilaterally with glove and stocking type diminished pattern symmetrical ?Coordination: FTN intact bilaterally, no ataxia in BLE. ?Gait- deferred ? ?NIHSS-1 for dysarthria ? ? ?Labs ?I have reviewed labs in epic and the results pertinent to this consultation are: ? ? ?CBC ?   ?Component Value Date/Time  ? WBC 7.9 09/15/2021 1802  ? RBC 4.69 09/15/2021 1802  ? HGB 11.9 (L) 09/15/2021 1802  ? HGB 11.9 (L) 03/08/2021 1433  ? HCT 36.8 09/15/2021 1802  ? PLT 292 09/15/2021 1802  ? PLT 263 03/08/2021 1433  ? MCV 78.5 (L) 09/15/2021 1802  ? MCH 25.4 (L) 09/15/2021 1802  ? MCHC 32.3 09/15/2021 1802  ? RDW 14.6 09/15/2021 1802  ? LYMPHSABS 2.5 09/15/2021 1802  ? MONOABS 1.0 09/15/2021  1802  ? EOSABS 0.1 09/15/2021 1802  ? BASOSABS 0.0 09/15/2021 1802  ? ? ?CMP  ?   ?Component Value Date/Time  ? NA 138 09/15/2021 1802  ? K 4.0 09/15/2021 1802  ? CL 103 09/15/2021 1802  ? CO2 25 09/15/2021 1802  ? GLUCOSE 112 (H) 09/15/2021 1802  ? GLUCOSE 129 (H) 04/28/2006 0951  ? BUN 27 (H) 09/15/2021 1802  ? CREATININE 1.13 (H) 09/15/2021 1802  ? CREATININE 1.21 (H) 09/06/2021 3128  ? CALCIUM 10.0 09/15/2021 1802  ? PROT 7.2 09/06/2021 0953  ? ALBUMIN 3.2 (L) 09/06/2021 0953  ? AST 19 09/06/2021 0953  ? ALT 28 09/06/2021 0953  ? ALKPHOS 98 09/06/2021 0953  ? BILITOT 0.3 09/06/2021 0953  ? GFRNONAA 55 (L) 09/15/2021 1802  ? GFRNONAA 51 (L) 09/06/2021 0953  ? GFRAA >60 01/10/2020 1103  ? ?LDL 169 in October ?A1c 13.9 last check ? ?Imaging ?I have reviewed the images obtained: ?MR brain with small foci of acute ischemia within both cerebellar hemispheres.  Chronic microvascular ischemic changes. ?MRI angio head and neck October 2022 with distal V4 right vertebral artery occlusion, nearly occlusive stenosis versus occlusion of the left V4 just beyond left PICA.  Redemonstrated severe  stenosis within the distal V4 left vertebral artery.  Basilar although patent, severely diminutive.  Progressive atherosclerotic disease elsewhere.  Progressive stenosis within the P1 left PCA, progressive mode

## 2021-09-15 NOTE — ED Notes (Signed)
Patient transported to CT 

## 2021-09-15 NOTE — ED Notes (Signed)
Pt requested blood sugar to be checked. RN notified of critical low, EDP notified as well. Pt given juice and crackers.  ?

## 2021-09-16 ENCOUNTER — Encounter (HOSPITAL_COMMUNITY): Payer: Self-pay | Admitting: Internal Medicine

## 2021-09-16 ENCOUNTER — Observation Stay (HOSPITAL_COMMUNITY): Payer: 59

## 2021-09-16 DIAGNOSIS — I639 Cerebral infarction, unspecified: Secondary | ICD-10-CM | POA: Diagnosis not present

## 2021-09-16 DIAGNOSIS — E162 Hypoglycemia, unspecified: Secondary | ICD-10-CM

## 2021-09-16 DIAGNOSIS — I679 Cerebrovascular disease, unspecified: Secondary | ICD-10-CM

## 2021-09-16 DIAGNOSIS — I6389 Other cerebral infarction: Secondary | ICD-10-CM | POA: Diagnosis not present

## 2021-09-16 DIAGNOSIS — R590 Localized enlarged lymph nodes: Secondary | ICD-10-CM

## 2021-09-16 DIAGNOSIS — I10 Essential (primary) hypertension: Secondary | ICD-10-CM | POA: Diagnosis not present

## 2021-09-16 DIAGNOSIS — E041 Nontoxic single thyroid nodule: Secondary | ICD-10-CM

## 2021-09-16 DIAGNOSIS — N1831 Chronic kidney disease, stage 3a: Secondary | ICD-10-CM

## 2021-09-16 DIAGNOSIS — E1151 Type 2 diabetes mellitus with diabetic peripheral angiopathy without gangrene: Secondary | ICD-10-CM | POA: Diagnosis not present

## 2021-09-16 DIAGNOSIS — E1169 Type 2 diabetes mellitus with other specified complication: Secondary | ICD-10-CM | POA: Diagnosis not present

## 2021-09-16 LAB — LIPID PANEL
Cholesterol: 308 mg/dL — ABNORMAL HIGH (ref 0–200)
HDL: 40 mg/dL — ABNORMAL LOW (ref >40)
LDL Cholesterol: 240 mg/dL — ABNORMAL HIGH (ref 0–99)
Total CHOL/HDL Ratio: 7.7 RATIO
Triglycerides: 138 mg/dL (ref <150)
VLDL: 28 mg/dL (ref 0–40)

## 2021-09-16 LAB — GLUCOSE, CAPILLARY
Glucose-Capillary: 110 mg/dL — ABNORMAL HIGH (ref 70–99)
Glucose-Capillary: 136 mg/dL — ABNORMAL HIGH (ref 70–99)
Glucose-Capillary: 178 mg/dL — ABNORMAL HIGH (ref 70–99)
Glucose-Capillary: 195 mg/dL — ABNORMAL HIGH (ref 70–99)
Glucose-Capillary: 261 mg/dL — ABNORMAL HIGH (ref 70–99)
Glucose-Capillary: 296 mg/dL — ABNORMAL HIGH (ref 70–99)
Glucose-Capillary: 40 mg/dL — CL (ref 70–99)
Glucose-Capillary: 50 mg/dL — ABNORMAL LOW (ref 70–99)
Glucose-Capillary: 87 mg/dL (ref 70–99)
Glucose-Capillary: 97 mg/dL (ref 70–99)

## 2021-09-16 LAB — HEMOGLOBIN A1C
Hgb A1c MFr Bld: 12.9 % — ABNORMAL HIGH (ref 4.8–5.6)
Mean Plasma Glucose: 323.53 mg/dL

## 2021-09-16 LAB — RAPID URINE DRUG SCREEN, HOSP PERFORMED
Amphetamines: NOT DETECTED
Barbiturates: NOT DETECTED
Benzodiazepines: NOT DETECTED
Cocaine: NOT DETECTED
Opiates: NOT DETECTED
Tetrahydrocannabinol: NOT DETECTED

## 2021-09-16 LAB — ECHOCARDIOGRAM COMPLETE
Area-P 1/2: 2.69 cm2
Calc EF: 64.1 %
Height: 64 in
MV VTI: 2.32 cm2
S' Lateral: 2.9 cm
Single Plane A2C EF: 61.1 %
Single Plane A4C EF: 64.8 %
Weight: 3188.73 oz

## 2021-09-16 LAB — CBG MONITORING, ED: Glucose-Capillary: 147 mg/dL — ABNORMAL HIGH (ref 70–99)

## 2021-09-16 LAB — ALPHA-THALASSEMIA GENOTYPR

## 2021-09-16 MED ORDER — EZETIMIBE 10 MG PO TABS
10.0000 mg | ORAL_TABLET | Freq: Every day | ORAL | Status: DC
  Administered 2021-09-16 – 2021-09-17 (×2): 10 mg via ORAL
  Filled 2021-09-16 (×2): qty 1

## 2021-09-16 MED ORDER — CLOPIDOGREL BISULFATE 75 MG PO TABS
300.0000 mg | ORAL_TABLET | Freq: Once | ORAL | Status: AC
  Administered 2021-09-16: 300 mg via ORAL
  Filled 2021-09-16: qty 4

## 2021-09-16 MED ORDER — INSULIN ASPART 100 UNIT/ML IJ SOLN
0.0000 [IU] | INTRAMUSCULAR | Status: DC
  Administered 2021-09-16 – 2021-09-17 (×3): 8 [IU] via SUBCUTANEOUS
  Administered 2021-09-17: 5 [IU] via SUBCUTANEOUS
  Administered 2021-09-17: 15 [IU] via SUBCUTANEOUS
  Administered 2021-09-17 (×3): 3 [IU] via SUBCUTANEOUS

## 2021-09-16 MED ORDER — DEXTROSE 50 % IV SOLN
INTRAVENOUS | Status: AC
  Administered 2021-09-16: 25 g via INTRAVENOUS
  Filled 2021-09-16: qty 50

## 2021-09-16 MED ORDER — DEXTROSE 50 % IV SOLN: 25.0000 g | INTRAVENOUS | Status: AC

## 2021-09-16 MED ORDER — DEXTROSE-NACL 5-0.9 % IV SOLN: INTRAVENOUS | Status: DC

## 2021-09-16 MED ORDER — CLOPIDOGREL BISULFATE 75 MG PO TABS
75.0000 mg | ORAL_TABLET | Freq: Every day | ORAL | Status: DC
Start: 2021-09-17 — End: 2021-09-17

## 2021-09-16 MED ORDER — PERFLUTREN LIPID MICROSPHERE
1.0000 mL | INTRAVENOUS | Status: AC | PRN
  Administered 2021-09-16: 2 mL via INTRAVENOUS

## 2021-09-16 MED ORDER — DEXTROSE 50 % IV SOLN: 50.0000 mL | INTRAVENOUS | Status: AC

## 2021-09-16 NOTE — Assessment & Plan Note (Signed)
Continue PPI ?

## 2021-09-16 NOTE — Evaluation (Signed)
Speech Language Pathology Evaluation Patient Details Name: Leelah Hanna MRN: [id number] DOB: May 13, 1960 Today's Date: 09/16/2021 Time: 0940-1000 SLP Time Calculation (min) (ACUTE ONLY): 20 min  Problem List:  Patient Active Problem List   Diagnosis Date Noted   Stage 3a chronic kidney disease (CKD) ([place]) 09/16/2021   Thyroid nodule 09/16/2021   Cervical lymphadenopathy 09/16/2021   Acute CVA (cerebrovascular accident) ([place]) 09/15/2021   GERD (gastroesophageal reflux disease) 04/30/2021   Peripheral arterial disease ([place]) 04/13/2021   Atherosclerosis of aorta ([place]) 02/14/2021   Cerebral thrombosis with cerebral infarction 02/08/2021   Abnormal MRI of head 02/07/2021   Vertigo 02/07/2021   Class 1 obesity due to excess calories with body mass index (BMI) of 31.0 to 31.9 in adult 02/07/2021   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   Trigger finger, left ring finger 10/04/2018   Alternating constipation and diarrhea 04/20/2018   Diabetic peripheral neuropathy associated with type 2 diabetes mellitus ([place]) 02/26/2018   DM (diabetes mellitus), type 2 with peripheral vascular complications ([place]) 78/24/2353   ABNORMALITY OF GAIT 05/10/2010   ECZEMA, ATOPIC 04/03/2009   Hyperlipidemia associated with type 2 diabetes mellitus ([place]) 03/06/2009   BACK PAIN 11/14/2008   Cerebrovascular disease 08/05/2008   DIPLOPIA 07/15/2008   Essential hypertension 07/15/2008   Past Medical History:  Past Medical History:  Diagnosis Date   Abnormality of gait 05/10/2010   BACK PAIN 11/14/2008   Class 1 obesity due to excess calories with body mass index (BMI) of 31.0 to 31.9 in adult 02/07/2021   DIABETES MELLITUS, TYPE II 07/15/2008   Diplopia 07/15/2008   ECZEMA, ATOPIC 04/03/2009   GERD (gastroesophageal reflux disease)    Guillain-Barre ([place]) 1988   HYPERLIPIDEMIA 03/06/2009   HYPERTENSION 07/15/2008   Stroke ([place]) 2010, 2011   x2    Vertebral artery stenosis    Past Surgical  History:  Past Surgical History:  Procedure Laterality Date   ABDOMINAL HYSTERECTOMY     DILATION AND CURETTAGE OF UTERUS     FOOT SURGERY     HPI:  Pt is a 61 y.o. female with medical history significant of obesity, diabetes, hyperlipidemia, hypertension, small cell lymphocytic impairment, peripheral arterial disease, GERD, CVA presenting with unsteady gait/dizziness. MRI of head revealed small foci of acute ischemia within both cerebellar hemispheres   Assessment / Plan / Recommendation Clinical Impression  Pt presents with cognitive deficits though difficult to tell if worsened from baseline. Pt states this is her 4th stroke, oriented x4. She denies changes in thinking skills since admission. Formal cognitive testing however displayed moderate level impairments. ([place] Mental Status: 12/30). Deficits exhibited in recall, executive function, mental manipulation. She has support from sister and local friends. She states she was independent ADLs including medicine management and financial management. Recommend supervision with complex ADLs. SLP will continue to follow while acutely hospitalized to maxmizie cognitive recovery and discern cognitive baseline.    SLP Assessment  SLP Recommendation/Assessment: Patient needs continued Speech Lolo Pathology Services SLP Visit Diagnosis: Cognitive communication deficit (R41.841)    Recommendations for follow up therapy are one component of a multi-disciplinary discharge planning process, led by the attending physician.  Recommendations may be updated based on patient status, additional functional criteria and insurance authorization.    Follow Up Recommendations   (TBD)    Assistance Recommended at Discharge  None  Functional Status Assessment    Frequency and Duration min 1 x/week         SLP Evaluation Cognition  Overall Cognitive  Status: Difficult to assess (states no changes in cognition, may be close to  baseline) Arousal/Alertness: Awake/alert Orientation Level: Oriented X4 Memory: Impaired Memory Impairment: Decreased recall of new information;Decreased short term memory Awareness: Appears intact Problem Solving: Impaired Problem Solving Impairment: Verbal complex;Functional complex Executive Function: Sequencing;Organizing;Self Monitoring Sequencing: Impaired Sequencing Impairment: Verbal complex;Functional complex Organizing: Impaired Organizing Impairment: Verbal complex;Functional complex Safety/Judgment:  (per discussion with PT/OT pt cautious with ambulation tasks)       Comprehension  Auditory Comprehension Overall Auditory Comprehension: Appears within functional limits for tasks assessed    Expression Expression Primary Mode of Expression: Verbal Verbal Expression Overall Verbal Expression: Appears within functional limits for tasks assessed Initiation: No impairment Written Expression Dominant Hand: Right   Oral / Motor  Oral Motor/Sensory Function Overall Oral Motor/Sensory Function: Within functional limits Motor Speech Overall Motor Speech: Appears within functional limits for tasks assessed            Hayden Rasmussen MA, CCC-SLP Acute Rehabilitation Services   09/16/2021, 10:27 AM

## 2021-09-16 NOTE — Progress Notes (Signed)
  Transition of Care [facility]) Screening Note   Patient Details  Name: [name] Date of Birth: 01/01/1961   [facility] of [facility]) CM/SW Contact:    Cyndi Bender, RN Phone Number: 09/16/2021, 8:19 AM    Transition of Care Department [facility]) has reviewed patient and no TOC needs have been identified at this time. We will continue to monitor patient advancement through interdisciplinary progression rounds. If new patient transition needs arise, please place a TOC consult.

## 2021-09-16 NOTE — Progress Notes (Signed)
09/16/21 Hypoglycemic w CBG 40.  Stat D50 50cc x1.  Held long acting insulin. CBG Q1H x4 until hypoglycemia resolves and CBGs are stable.

## 2021-09-16 NOTE — Evaluation (Signed)
Occupational Therapy Evaluation Patient Details Name: [name] MRN: [id number] DOB: 1960/08/03 Today's Date: 09/16/2021   History of Present Illness Pt is a 61 year old woman admitted on 09/15/21 with unstable gait and dizziness. MRI + B cerebellar infarcts. PMH: DM, HLD, HTN, small cell lymphocitic impairment, PAD, LE neuropathy, CVA, GBS, obesity.   Clinical Impression   Pt was living alone, ambulating with a cane and functioning independently in ADLs and IADLs. She plans to go home with assistance of her sister upon discharge. Pt presents with baseline L visual field cut. She had difficulty complying to requirements of testing for vertigo. She was briefly dizzy upon sitting EOB and walked tentatively, but no overt LOB with use of cane. Pt requires set up to min guard assist for ADLs. Will follow acutely, recommending HHOT.      Recommendations for follow up therapy are one component of a multi-disciplinary discharge planning process, led by the attending physician.  Recommendations may be updated based on patient status, additional functional criteria and insurance authorization.   Follow Up Recommendations  Home health OT    Assistance Recommended at Discharge Intermittent Supervision/Assistance  Patient can return home with the following A little help with walking and/or transfers;A little help with bathing/dressing/bathroom    Functional Status Assessment  Patient has had a recent decline in their functional status and demonstrates the ability to make significant improvements in function in a reasonable and predictable amount of time.  Equipment Recommendations  Tub/shower seat    Recommendations for Other Services       Precautions / Restrictions Precautions Precautions: Fall Restrictions Weight Bearing Restrictions: No      Mobility Bed Mobility Overal bed mobility: Needs Assistance Bed Mobility: Supine to Sit     Supine to sit: Supervision     General bed  mobility comments: HOB up, pt with brief c/o dizziness    Transfers Overall transfer level: Needs assistance Equipment used: Straight cane Transfers: Sit to/from Stand Sit to Stand: Min guard           General transfer comment: denies dizziness upon standing, but cued to stand momentarily      Balance Overall balance assessment: Needs assistance   Sitting balance-Leahy Scale: Good     Standing balance support: Single extremity supported Standing balance-Leahy Scale: Poor Standing balance comment: tentative, reliant on cane and min guard assist                           ADL either performed or assessed with clinical judgement   ADL Overall ADL's : Needs assistance/impaired Eating/Feeding: Independent   Grooming: Wash/dry hands;Standing;Min guard   Upper Body Bathing: Set up;Sitting   Lower Body Bathing: Min guard;Sit to/from stand   Upper Body Dressing : Set up;Sitting   Lower Body Dressing: Min guard;Sit to/from stand   Toilet Transfer: Min guard;Ambulation   Toileting- Clothing Manipulation and Hygiene: Min guard;Sit to/from stand       Functional mobility during ADLs: Min guard;Cane       Vision Ability to See in Adequate Light: 1 Impaired Patient Visual Report: Blurring of vision Additional Comments: pt with L visual field cut from previous CVA     Perception     Praxis      Pertinent Vitals/Pain Pain Assessment Pain Assessment: No/denies pain     Hand Dominance Right   Extremity/Trunk Assessment Upper Extremity Assessment Upper Extremity Assessment: Overall WFL for tasks assessed   Lower  Extremity Assessment Lower Extremity Assessment: Defer to PT evaluation       Communication Communication Communication: No difficulties   Cognition Arousal/Alertness: Awake/alert Behavior During Therapy: WFL for tasks assessed/performed Overall Cognitive Status: Within Functional Limits for tasks assessed                                  General Comments: some difficulty complying to requirements of testing for vertigo     General Comments       Exercises     Shoulder Instructions      Home Living Family/patient expects to be discharged to:: Private residence Living Arrangements: Alone Available Help at Discharge: Family;Available 24 hours/day (sister can stay with her) Type of Home: Apartment Home Access: Stairs to enter Entrance Stairs-Number of Steps: 3 Entrance Stairs-Rails: None Home Layout: One level     Bathroom Shower/Tub: Corporate investment banker: Standard     Home Equipment: Cane - single point   Additional Comments: No falls last 6 months  Lives With: Alone    Prior Functioning/Environment Prior Level of Function : Independent/Modified Independent;Driving             Mobility Comments: walks with a cane          OT Problem List: Impaired balance (sitting and/or standing)      OT Treatment/Interventions: Self-care/ADL training;Therapeutic activities;Patient/family education;Balance training    OT Goals(Current goals can be found in the care plan section) Acute Rehab OT Goals OT Goal Formulation: With patient Time For Goal Achievement: 09/30/21 Potential to Achieve Goals: Good ADL Goals Pt Will Perform Grooming: with modified independence;standing Pt Will Perform Lower Body Bathing: with modified independence;sit to/from stand Pt Will Perform Lower Body Dressing: with modified independence;sit to/from stand Pt Will Transfer to Toilet: with modified independence;ambulating Pt Will Perform Toileting - Clothing Manipulation and hygiene: with modified independence;sit to/from stand Additional ADL Goal #1: Pt will participate in formal screening to assess ability to manage medications. Additional ADL Goal #2: Pt will restate at least 3 fall prevention strategies as instructed.  OT Frequency: Min 2X/week    Co-evaluation              AM-PAC  OT "6 Clicks" Daily Activity     Outcome Measure Help from another person eating meals?: None Help from another person taking care of personal grooming?: A Little Help from another person toileting, which includes using toliet, bedpan, or urinal?: A Little Help from another person bathing (including washing, rinsing, drying)?: A Little Help from another person to put on and taking off regular upper body clothing?: None Help from another person to put on and taking off regular lower body clothing?: A Little 6 Click Score: 20   End of Session Equipment Utilized During Treatment: Gait belt  Activity Tolerance: Patient tolerated treatment well Patient left: in chair;with call bell/phone within reach;with chair alarm set  OT Visit Diagnosis: Unsteadiness on feet (R26.81);Other abnormalities of gait and mobility (R26.89);Dizziness and giddiness (R42)                Time: 3710-6269 OT Time Calculation (min): 32 min Charges:  OT General Charges $OT Visit: 1 Visit OT Evaluation $OT Eval Moderate Complexity: 1 Mod  Nestor Lewandowsky, OTR/L Acute Rehabilitation Services Pager: [phone] Office: [phone]   Malka So 09/16/2021, 12:59 PM

## 2021-09-16 NOTE — Progress Notes (Signed)
STROKE TEAM PROGRESS NOTE   SUBJECTIVE (INTERVAL HISTORY) Her sister is at the bedside.  Overall her condition is largely resolved.  Patient stated yesterday she has dizziness and diplopia, but now no dizziness but still has blurry vision but no more diplopia.  CT head and neck showed progression of multifocal intracranial stenosis, especially posterior circulation.  Plan to have cerebral angiogram in a.m. to further evaluate.  Discussed with Dr. Gerhard Perches.  Patient is in agreement.   OBJECTIVE Temp:  [97.8 F (36.6 C)-98.8 F (37.1 C)] 98.1 F (36.7 C) (05/18 1602) Pulse Rate:  [47-54] 49 (05/18 1602) Cardiac Rhythm: Sinus bradycardia (05/18 0953) Resp:  [16-20] 18 (05/18 1602) BP: (143-194)/(64-104) 146/74 (05/18 1602) SpO2:  [94 %-100 %] 94 % (05/18 1602) Weight:  [90.4 kg] 90.4 kg (05/18 0328)  Recent Labs  Lab 09/16/21 0802 09/16/21 0858 09/16/21 0954 09/16/21 1210 09/16/21 1604  GLUCAP 87 50* 110* 97 261*   Recent Labs  Lab 09/15/21 1802  NA 138  K 4.0  CL 103  CO2 25  GLUCOSE 112*  BUN 27*  CREATININE 1.13*  CALCIUM 10.0   No results for input(s): AST, ALT, ALKPHOS, BILITOT, PROT, ALBUMIN in the last 168 hours. Recent Labs  Lab 09/15/21 1802  WBC 7.9  NEUTROABS 4.2  HGB 11.9*  HCT 36.8  MCV 78.5*  PLT 292   No results for input(s): CKTOTAL, CKMB, CKMBINDEX, TROPONINI in the last 168 hours. No results for input(s): LABPROT, INR in the last 72 hours. No results for input(s): COLORURINE, LABSPEC, [place], GLUCOSEU, HGBUR, BILIRUBINUR, KETONESUR, PROTEINUR, UROBILINOGEN, NITRITE, LEUKOCYTESUR in the last 72 hours.  Invalid input(s): APPERANCEUR     Component Value Date/Time   CHOL 308 (H) 09/16/2021 0248   TRIG 138 09/16/2021 0248   TRIG 72 04/28/2006 0951   HDL 40 (L) 09/16/2021 0248   CHOLHDL 7.7 09/16/2021 0248   VLDL 28 09/16/2021 0248   LDLCALC 240 (H) 09/16/2021 0248   Lab Results  Component Value Date   HGBA1C 12.9 (H) 09/16/2021       Component Value Date/Time   LABOPIA NONE DETECTED 02/08/2021 0532   COCAINSCRNUR NONE DETECTED 02/08/2021 0532   LABBENZ NONE DETECTED 02/08/2021 0532   AMPHETMU NONE DETECTED 02/08/2021 0532   THCU NONE DETECTED 02/08/2021 0532   LABBARB NONE DETECTED 02/08/2021 0532    No results for input(s): ETH in the last 168 hours.  I have personally reviewed the radiological images below and agree with the radiology interpretations.  CT ANGIO HEAD NECK W WO CM  Addendum Date: 09/16/2021   ADDENDUM REPORT: 09/16/2021 00:01 ADDENDUM: The patient has a history of hematologic malignancy, which is compatible with the lymphadenopathy described in the original report. Electronically Signed   By: Ulyses Jarred M.D.   On: 09/16/2021 00:01   Result Date: 09/16/2021 CLINICAL DATA:  Dizziness EXAM: CT ANGIOGRAPHY HEAD AND NECK TECHNIQUE: Multidetector CT imaging of the head and neck was performed using the standard protocol during bolus administration of intravenous contrast. Multiplanar CT image reconstructions and MIPs were obtained to evaluate the vascular anatomy. Carotid stenosis measurements (when applicable) are obtained utilizing NASCET criteria, using the distal internal carotid diameter as the denominator. RADIATION DOSE REDUCTION: This exam was performed according to the departmental dose-optimization program which includes automated exposure control, adjustment of the mA and/or kV according to patient size and/or use of iterative reconstruction technique. CONTRAST:  65m OMNIPAQUE IOHEXOL 350 MG/ML SOLN COMPARISON:  MRA head 02/07/2021 FINDINGS: CTA NECK FINDINGS SKELETON:  There is no bony spinal canal stenosis. No lytic or blastic lesion. OTHER NECK: Heterogeneous thyroid gland with 1.6 cm right thyroid nodule. There are multiple enlarged bilateral cervical lymph nodes. At level left 2A is a node measuring 13 mm. At level right 1B is a 15 mm node. UPPER CHEST: No pneumothorax or pleural effusion. No  nodules or masses. AORTIC ARCH: There is calcific atherosclerosis of the aortic arch. There is no aneurysm, dissection or hemodynamically significant stenosis of the visualized portion of the aorta. Rights of the visualized proximal subclavian arteries are widely patent. RIGHT CAROTID SYSTEM: No dissection, occlusion or aneurysm. Mild atherosclerotic calcification at the carotid bifurcation without hemodynamically significant stenosis. LEFT CAROTID SYSTEM: No dissection, occlusion or aneurysm. Mild atherosclerotic calcification at the carotid bifurcation without hemodynamically significant stenosis. VERTEBRAL ARTERIES: Left dominant configuration. Both origins are clearly patent. There is no dissection, occlusion or flow-limiting stenosis to the skull base (V1-V3 segments). CTA HEAD FINDINGS POSTERIOR CIRCULATION: --Vertebral arteries: Both distal V4 segments are occluded. This appears to have progressed since the MRA of 02/07/2021. --Inferior cerebellar arteries: Normal. --Basilar artery: Patent but diminutive. --Superior cerebellar arteries: Normal. --Posterior cerebral arteries (PCA): Normal. ANTERIOR CIRCULATION: --Intracranial internal carotid arteries: Atherosclerotic calcification of the internal carotid arteries at the skull base without hemodynamically significant stenosis. --Anterior cerebral arteries (ACA): Normal. Both A1 segments are present. Patent anterior communicating artery (a-comm). --Middle cerebral arteries (MCA): Normal. VENOUS SINUSES: As permitted by contrast timing, patent. ANATOMIC VARIANTS: None Review of the MIP images confirms the above findings. IMPRESSION: 1. Bilateral distal V4 segment occlusion, which appears to have progressed since the MRA of 02/07/2021. 2. 1.6 cm incidental right thyroid nodule with heterogeneous and enlarged thyroid. Recommend thyroid US. Reference: J Am Coll Radiol. 2015 Feb;12(2): 143-50 3. Multiple enlarged bilateral cervical lymph nodes, of uncertain etiology  but concerning for lymphoproliferative disorder. Aortic Atherosclerosis (ICD10-I70.0). Electronically Signed: By: Ulyses Jarred M.D. On: 09/15/2021 23:14   CT Head Wo Contrast  Result Date: 09/15/2021 CLINICAL DATA:  Dizziness. EXAM: CT HEAD WITHOUT CONTRAST TECHNIQUE: Contiguous axial images were obtained from the base of the skull through the vertex without intravenous contrast. RADIATION DOSE REDUCTION: This exam was performed according to the departmental dose-optimization program which includes automated exposure control, adjustment of the mA and/or kV according to patient size and/or use of iterative reconstruction technique. COMPARISON:  Head CT dated 04/09/2020. FINDINGS: Brain: The ventricles and sulci are appropriate size for the patient's age. The gray-white matter discrimination is preserved. There is no acute intracranial hemorrhage. No mass effect or midline shift. No extra-axial fluid collection. Vascular: No hyperdense vessel or unexpected calcification. Skull: Normal. Negative for fracture or focal lesion. Sinuses/Orbits: No acute finding. Other: None IMPRESSION: No acute intracranial pathology. Electronically Signed   By: Anner Crete M.D.   On: 09/15/2021 17:17   MR BRAIN WO CONTRAST  Result Date: 09/15/2021 CLINICAL DATA:  Dizziness EXAM: MRI HEAD WITHOUT CONTRAST TECHNIQUE: Multiplanar, multiecho pulse sequences of the brain and surrounding structures were obtained without intravenous contrast. COMPARISON:  None Available. FINDINGS: Brain: There are small foci of abnormal diffusion restriction within both cerebellar hemispheres. Chronic microhemorrhage in the right thalamus. There is multifocal hyperintense T2-weighted signal within the white matter. Parenchymal volume and CSF spaces are normal. The midline structures are normal. Vascular: Major flow voids are preserved. Skull and upper cervical spine: Normal calvarium and skull base. Visualized upper cervical spine and soft tissues  are normal. Sinuses/Orbits:No paranasal sinus fluid levels or advanced mucosal thickening. No  mastoid or middle ear effusion. Normal orbits. IMPRESSION: 1. Small foci of acute ischemia within both cerebellar hemispheres. No hemorrhage or mass effect. 2. Findings of chronic small vessel ischemia. Electronically Signed   By: Ulyses Jarred M.D.   On: 09/15/2021 21:50   ECHOCARDIOGRAM COMPLETE  Result Date: 09/16/2021    ECHOCARDIOGRAM REPORT   Patient Name:   LESIA MONICA Date of Exam: 09/16/2021 Medical Rec #:  [id number]       Height:       64.0 in Accession #:    [id number]      Weight:       199.3 lb Date of Birth:  12/31/1960       BSA:          1.953 m Patient Age:    61 years        BP:           137/73 mmHg Patient Gender: F               HR:           42 bpm. Exam Location:  Inpatient Procedure: 2D Echo, 3D Echo, Cardiac Doppler, Color Doppler and Intracardiac            Opacification Agent Indications:    Stroke  History:        Patient has prior history of Echocardiogram examinations, most                 recent 02/08/2021. Stroke; Risk Factors:Dyslipidemia, Diabetes                 and Hypertension.  Sonographer:    Roseanna Rainbow RDCS Referring Phys: [id number] Candace Gallus [facility]  Sonographer Comments: Patient is morbidly obese, Technically difficult study due to poor echo windows and suboptimal apical window. Image acquisition challenging due to patient body habitus. IMPRESSIONS  1. Left ventricular ejection fraction, by estimation, is 60 to 65%. The left ventricle has normal function. The left ventricle has no regional wall motion abnormalities. Left ventricular diastolic parameters are consistent with Grade II diastolic dysfunction (pseudonormalization). Elevated left ventricular end-diastolic pressure.  2. Right ventricular systolic function is normal. The right ventricular size is normal.  3. The mitral valve is abnormal. Mild mitral valve regurgitation. No evidence of mitral stenosis.  4. The aortic  valve is normal in structure. Aortic valve regurgitation is not visualized. No aortic stenosis is present.  5. The inferior vena cava is normal in size with greater than 50% respiratory variability, suggesting right atrial pressure of 3 mmHg. FINDINGS  Left Ventricle: Left ventricular ejection fraction, by estimation, is 60 to 65%. The left ventricle has normal function. The left ventricle has no regional wall motion abnormalities. Definity contrast agent was given IV to delineate the left ventricular  endocardial borders. The left ventricular internal cavity size was normal in size. There is no left ventricular hypertrophy. Left ventricular diastolic parameters are consistent with Grade II diastolic dysfunction (pseudonormalization). Elevated left ventricular end-diastolic pressure. Right Ventricle: The right ventricular size is normal. No increase in right ventricular wall thickness. Right ventricular systolic function is normal. Left Atrium: Left atrial size was normal in size. Right Atrium: Right atrial size was normal in size. Pericardium: There is no evidence of pericardial effusion. Mitral Valve: The mitral valve is abnormal. There is mild thickening of the mitral valve leaflet(s). There is mild calcification of the mitral valve leaflet(s). Mild mitral valve regurgitation. No evidence of mitral valve stenosis. MV peak gradient,  6.1 mmHg. The mean mitral valve gradient is 2.0 mmHg. Tricuspid Valve: The tricuspid valve is normal in structure. Tricuspid valve regurgitation is not demonstrated. No evidence of tricuspid stenosis. Aortic Valve: The aortic valve is normal in structure. Aortic valve regurgitation is not visualized. No aortic stenosis is present. Pulmonic Valve: The pulmonic valve was normal in structure. Pulmonic valve regurgitation is not visualized. No evidence of pulmonic stenosis. Aorta: The aortic root is normal in size and structure. Venous: The inferior vena cava is normal in size with greater  than 50% respiratory variability, suggesting right atrial pressure of 3 mmHg. IAS/Shunts: No atrial level shunt detected by color flow Doppler.  LEFT VENTRICLE PLAX 2D LVIDd:         4.30 cm      Diastology LVIDs:         2.90 cm      LV e' medial:    4.16 cm/s LV PW:         1.40 cm      LV E/e' medial:  27.9 LV IVS:        1.00 cm      LV e' lateral:   6.01 cm/s LVOT diam:     2.30 cm      LV E/e' lateral: 19.3 LV SV:         114 LV SV Index:   58 LVOT Area:     4.15 cm                              3D Volume EF: LV Volumes (MOD)            3D EF:        64 % LV vol d, MOD A2C: 122.0 ml LV EDV:       242 ml LV vol d, MOD A4C: 170.0 ml LV ESV:       88 ml LV vol s, MOD A2C: 47.4 ml  LV SV:        155 ml LV vol s, MOD A4C: 59.9 ml LV SV MOD A2C:     74.6 ml LV SV MOD A4C:     170.0 ml LV SV MOD BP:      95.1 ml RIGHT VENTRICLE            IVC RV S prime:     9.76 cm/s  IVC diam: 2.10 cm TAPSE (M-mode): 2.0 cm LEFT ATRIUM             Index        RIGHT ATRIUM           Index LA diam:        3.40 cm 1.74 cm/m   RA Area:     10.70 cm LA Vol (A2C):   63.7 ml 32.61 ml/m  RA Volume:   20.00 ml  10.24 ml/m LA Vol (A4C):   72.4 ml 37.06 ml/m LA Biplane Vol: 68.3 ml 34.96 ml/m  AORTIC VALVE LVOT Vmax:   83.90 cm/s LVOT Vmean:  57.000 cm/s LVOT VTI:    0.274 m  AORTA Ao Root diam: 3.30 cm Ao Asc diam:  3.45 cm MITRAL VALVE MV Area (PHT): 2.69 cm     SHUNTS MV Area VTI:   2.32 cm     Systemic VTI:  0.27 m MV Peak grad:  6.1 mmHg     Systemic Diam: 2.30 cm MV Mean grad:  2.0  mmHg MV Vmax:       1.23 m/s MV Vmean:      53.2 cm/s MV Decel Time: 282 msec MV E velocity: 116.00 cm/s MV A velocity: 79.60 cm/s MV E/A ratio:  1.46 Jenkins Rouge MD Electronically signed by Jenkins Rouge MD Signature Date/Time: 09/16/2021/9:08:45 AM    Final      PHYSICAL EXAM  Temp:  [97.8 F (36.6 C)-98.8 F (37.1 C)] 98.1 F (36.7 C) (05/18 1602) Pulse Rate:  [47-54] 49 (05/18 1602) Resp:  [16-20] 18 (05/18 1602) BP: (143-194)/(64-104)  146/74 (05/18 1602) SpO2:  [94 %-100 %] 94 % (05/18 1602) Weight:  [90.4 kg] 90.4 kg (05/18 0328)  General - Well nourished, well developed, in no apparent distress.  Ophthalmologic - fundi not visualized due to noncooperation.  Cardiovascular - Regular rhythm and rate.  Mental Status -  Level of arousal and orientation to time, place, and person were intact. Language including expression, naming, repetition, comprehension was assessed and found intact. Attention span and concentration were normal. Fund of Knowledge was assessed and was intact.  Cranial Nerves II - XII - II - Visual field intact OU. III, IV, VI - Extraocular movements intact. V - Facial sensation intact bilaterally. VII - Facial movement intact bilaterally. VIII - Hearing & vestibular intact bilaterally. X - Palate elevates symmetrically. XI - Chin turning & shoulder shrug intact bilaterally. XII - Tongue protrusion intact.  Motor Strength - The patient's strength was normal in all extremities and pronator drift was absent.  Bulk was normal and fasciculations were absent.   Motor Tone - Muscle tone was assessed at the neck and appendages and was normal.  Reflexes - The patient's reflexes were symmetrical in all extremities and she had no pathological reflexes.  Sensory - Light touch, temperature/pinprick were assessed and were symmetrical.    Coordination - The patient had normal movements in the right hand and foot with no ataxia or dysmetria.  Slight left finger-nose and heel-to-shin dysmetria.  Tremor was absent.  Gait and Station - deferred.   ASSESSMENT/PLAN [name] is a 61 y.o. female with history of hypertension, hyperlipidemia, diabetes, lymphoma, neuropathy, strokes admitted for dizziness and diplopia. No tPA given due to outside window.    Stroke:  bilateral cerebellum punctate infarcts, likely secondary to large vessel disease source from bilateral VA occlusion CT no acute  abnormality CTA head and neck bilateral V4 occlusion, basilar artery stenosis, bilateral siphon stenosis left more than right, right M1/M2 high-grade stenosis. MRI bilateral cerebellum punctate infarct 2D Echo EF 60 to 65% LDL 240 HgbA1c 12.9 Lovenox for VTE prophylaxis Pletal  prior to admission, now on aspirin 325 mg daily and clopidogrel 75 mg daily.  Patient counseled to be compliant with her antithrombotic medications Ongoing aggressive stroke risk factor management Therapy recommendations: Home health PT/OT Disposition: Pending  Multifocal intracranial stenosis CTA head and neck bilateral V4 occlusion, basilar artery stenosis, bilateral siphon stenosis left more than right, right M1/M2 high-grade stenosis. Chronic progression due to uncontrolled risk factors Discussed with IR, plan for cerebral angiogram in a.m. Continue DAPT  History of stroke Stroke in 07/2008 01/2021 admitted for bilateral lower extremity numbness, dizziness.  MRI showed left pontine infarct.  MRI showed right V4 occlusion, basilar artery small, multifocal severe intracranial stenosis including left PCA, right siphon, bilateral M1 and M2, right A1.  EF 55 to 60%.  LDL 169, A1c 10.7.  Discharged on DAPT and Lipitor 80.  Diabetes, uncontrolled HgbA1c 12.9 goal < 7.0  Glucose fluctuate, hyperglycemia with hypoglycemia CBG monitoring SSI DM education and close PCP follow up  Hypertension Stable on high and Permissive hypertension (OK if <220/120) for 24-48 hours post stroke and then gradually normalized within 5-7 days. Avoid low BP Long term BP goal normotensive  Hyperlipidemia Home meds: Lipitor 80, not compliant LDL 240, goal < 70 Now on Lipitor 80 and Zetia 10 Continue statin at discharge  Other Stroke Risk Factors Obesity, Body mass index is 34.21 kg/m.   Other Active Problems History of lymphoma Diabetic neuropathy  Hospital day # 0  I discussed with Drs. Amaresh and [place]. I spent  extensive time with the patient and her sister, more than 50% of which was spent in counseling and coordination of care, reviewing test results, images and medication, and discussing the diagnosis, treatment plan and potential prognosis. This patient's care requiresreview of multiple databases, neurological assessment, discussion with family, other specialists and medical decision making of high complexity.  Rosalin Hawking, MD PhD Stroke Neurology 09/16/2021 5:32 PM    To contact Stroke Continuity provider, please refer to [email or web address] After hours, contact General Neurology

## 2021-09-16 NOTE — Progress Notes (Signed)
Report given to [facility] RN of 3W, room is getting cleaned.

## 2021-09-16 NOTE — TOC Initial Note (Addendum)
Transition of Care [facility]) - Initial/Assessment Note    Patient Details  Name: [name] MRN: [id number] Date of Birth: 1960/10/12  Transition of [facility]) CM/SW Contact:    Cyndi Bender, RN Phone Number: 09/16/2021, 3:49 PM  Clinical Narrative:           Spoke to patient regarding transition needs. Patient prefers to do out patient rehab on [address] because she has been there before. Referral placed. DME ordered, walker and shower chair. Jermaine with [employer] notified and DME will be delivered to the room prior to discharge. Sister or Friend can transport home once ready for discharge.  Address, Phone number and PCP verified.      Expected Discharge Plan: OP Rehab Barriers to Discharge: Continued Medical Work up   Patient Goals and CMS Choice Patient states their goals for this hospitalization and ongoing recovery are:: return home      Expected Discharge Plan and Services Expected Discharge Plan: OP Rehab   Discharge Planning Services: CM Consult Post Acute Care Choice: [place] arrangements for the past 2 months: Apartment                 DME Arranged: Walker rolling, Shower stool DME Agency: [employer] Date DME Agency Contacted: 09/16/21 Time DME Agency Contacted: [phone] Representative spoke with at DME Agency: [place]            Prior Living Arrangements/Services Living arrangements for the past 2 months: Apartment Lives with:: Self Patient language and need for interpreter reviewed:: Yes Do you feel safe going back to the place where you live?: Yes      Need for Family Participation in Patient Care: Yes (Comment) Care giver support system in place?: Yes (comment)   Criminal Activity/Legal Involvement Pertinent to Current Situation/Hospitalization: No - Comment as needed  Activities of Daily Living Home Assistive Devices/Equipment: CBG Meter, Cane (specify quad or straight), Blood pressure cuff, Eyeglasses (single cane copper in  color) ADL Screening (condition at time of admission) Patient's cognitive ability adequate to safely complete daily activities?: Yes Is the patient deaf or have difficulty hearing?: No Does the patient have difficulty seeing, even when wearing glasses/contacts?: No Does the patient have difficulty concentrating, remembering, or making decisions?: No Patient able to express need for assistance with ADLs?: Yes Does the patient have difficulty dressing or bathing?: No Independently performs ADLs?: Yes (appropriate for developmental age) Does the patient have difficulty walking or climbing stairs?: No Weakness of Legs: Both Weakness of Arms/Hands: None  Permission Sought/Granted Permission sought to share information with : Case Manager       Permission granted to share info w AGENCY: out patient rehab        Emotional Assessment Appearance:: Appears stated age Attitude/Demeanor/Rapport: Engaged Affect (typically observed): Accepting Orientation: : Oriented to Self, Oriented to Place, Oriented to  Time, Oriented to Situation Alcohol / Substance Use: Not Applicable Psych Involvement: No (comment)  Admission diagnosis:  Hypoglycemia [E16.2] Acute CVA (cerebrovascular accident) ([place]) [I63.9] Hypertension, unspecified type [I10] Acute stroke due to ischemia [facility]) [I63.9] Patient Active Problem List   Diagnosis Date Noted   Stage 3a chronic kidney disease (CKD) ([place]) 09/16/2021   Thyroid nodule 09/16/2021   Cervical lymphadenopathy 09/16/2021   Acute CVA (cerebrovascular accident) ([place]) 09/15/2021   GERD (gastroesophageal reflux disease) 04/30/2021   Peripheral arterial disease ([place]) 04/13/2021   Atherosclerosis of aorta ([place]) 02/14/2021   Cerebral thrombosis with cerebral infarction 02/08/2021   Abnormal MRI of head 02/07/2021  Vertigo 02/07/2021   Class 1 obesity due to excess calories with body mass index (BMI) of 31.0 to 31.9 in adult 02/07/2021   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   Trigger finger, left ring finger 10/04/2018   Alternating constipation and diarrhea 04/20/2018   Diabetic peripheral neuropathy associated with type 2 diabetes mellitus ([place]) 02/26/2018   DM (diabetes mellitus), type 2 with peripheral vascular complications ([place]) 28/36/6294   ABNORMALITY OF GAIT 05/10/2010   ECZEMA, ATOPIC 04/03/2009   Hyperlipidemia associated with type 2 diabetes mellitus ([place]) 03/06/2009   BACK PAIN 11/14/2008   Cerebrovascular disease 08/05/2008   DIPLOPIA 07/15/2008   Essential hypertension 07/15/2008   PCP:  [name], [name], MD Pharmacy:   [facility] [address] [place] [place] [address] Phone: [phone] Fax: [phone]     Social Determinants of Health (SDOH) Interventions    Readmission Risk Interventions     View : No data to display.

## 2021-09-16 NOTE — Assessment & Plan Note (Signed)
LDL 240 - Continue Zetia and Atorvastatin

## 2021-09-16 NOTE — Assessment & Plan Note (Signed)
-   Consult IR for evaluation of posterior circulation stenoses - Echocardiogram showed no cardiogenic source of embolism - Carotid imaging unremarkable   - Lipids ordered: LDL 240, continue atorvastat and Zetia -Aspirin ordered at admission --> continue aspirin and Plavix -Atrial fibrillation: not present on tele

## 2021-09-16 NOTE — Assessment & Plan Note (Addendum)
Likely due to lymphoma, will defer to outpatient Oncology follow up

## 2021-09-16 NOTE — Assessment & Plan Note (Signed)
-   Allow permissive HTN - Hold metop, lisin, HCT, hydral

## 2021-09-16 NOTE — H&P (Signed)
Chief Complaint: Patient was seen in consultation today for diagnostic cerebral angiogram at the request of Frazier Richards, MD  Referring Physician(s): Frazier Richards, MD  Supervising Physician: Frazier Richards, MD  Patient Status: [facility] - In-pt  History of Present Illness: [name] is a 61 y.o. female with past medical history significant for obesity, DM II, HLD, HTN, PAD, GERD and CVA. Pt presented to [place] ED 09/15/21 c/o dizziness and unsteady gait. She was transferred to [facility] ED and had MRI brain to r/o acute stroke. MRI demonstrated small foci of acute ischemia within both cerebellar hemispheres. Pt underwent CT angio head/neck that showed bilateral distal V4 segment occlusion that has progressed since last study 02/07/2021. Dr. Frazier Richards, IR, recommends diagnostic cerebral angiogram with moderate sedation. Procedure is tentatively scheduled for 09/17/21.   Past Medical History:  Diagnosis Date   Abnormality of gait 05/10/2010   BACK PAIN 11/14/2008   Class 1 obesity due to excess calories with body mass index (BMI) of 31.0 to 31.9 in adult 02/07/2021   DIABETES MELLITUS, TYPE II 07/15/2008   Diplopia 07/15/2008   ECZEMA, ATOPIC 04/03/2009   GERD (gastroesophageal reflux disease)    Guillain-Barre ([place]) 1988   HYPERLIPIDEMIA 03/06/2009   HYPERTENSION 07/15/2008   Stroke ([place]) 2010, 2011   x2    Vertebral artery stenosis     Past Surgical History:  Procedure Laterality Date   ABDOMINAL HYSTERECTOMY     DILATION AND CURETTAGE OF UTERUS     FOOT SURGERY      Allergies: Patient has no known allergies.  Medications: Prior to Admission medications   Medication Sig Start Date End Date Taking? Authorizing Provider  amLODipine (NORVASC) 10 MG tablet Take 1 tablet by mouth once daily Patient taking differently: Take 10 mg by mouth every morning. 09/01/21  Yes [name], [name], MD  aspirin EC 81 MG tablet Take 81 mg by mouth every morning. Swallow whole.   Yes [provider]  B Complex Vitamins (VITAMIN B COMPLEX) TABS Take 1 tablet by mouth every morning.   Yes [provider]  Cholecalciferol (VITAMIN D3 PO) Take 1 capsule by mouth every morning.   Yes [provider]  cilostazol (PLETAL) 50 MG tablet Take 1 tablet (50 mg total) by mouth 2 (two) times daily. Patient taking differently: Take 50 mg by mouth every morning. 04/13/21  Yes Lorretta Harp, MD  FLUoxetine (PROZAC) 20 MG capsule Take 1 capsule by mouth once daily Patient taking differently: Take 20 mg by mouth every morning. 09/01/21  Yes [name], [name], MD  insulin glargine (LANTUS) 100 UNIT/ML Solostar Pen Inject 160 Units into the skin every morning. And pen needles 1/day Patient taking differently: Inject 160 Units into the skin every morning. And pen needles 1/day 05/04/21  Yes [name], [name], MD  lisinopril-hydrochlorothiazide (ZESTORETIC) 20-12.5 MG tablet Take 2 tablets by mouth once daily Patient taking differently: Take 2 tablets by mouth every morning. 09/01/21  Yes [name], [name], MD  metoprolol succinate (TOPROL-XL) 50 MG 24 hr tablet TAKE 1 TABLET BY MOUTH ONCE DAILY WITH MEALS OR  IMMEDIATELY  FOLLOWING  A  MEAL Patient taking differently: 50 mg every morning. 09/01/21  Yes [name], [name], MD  pantoprazole (PROTONIX) 40 MG tablet Take 1 tablet (40 mg total) by mouth daily. Patient taking differently: Take 40 mg by mouth every morning. 08/30/21  Yes [name], [name], MD  atorvastatin (LIPITOR) 80 MG tablet Take 1 tablet (80 mg total) by  mouth daily. Patient not taking: Reported on 09/16/2021 02/08/21 02/08/22  [name], [name], MD  Blood Pressure Monitoring (BLOOD PRESSURE MONITOR AUTOMAT) DEVI 1 Device by Does not apply route daily. 08/29/18   [name], [name], MD  Continuous Blood Gluc Sensor (FREESTYLE LIBRE 2 SENSOR) MISC 1 Device by Does not apply route every 14 (fourteen) days. 02/09/21   [name], MD  glucose blood (CONTOUR NEXT TEST) test strip 1 each  by Other route 2 (two) times daily. And lancets 2/day 03/09/18   [name], MD  glucose blood [facility] VERIO) test strip USE TO CHECK BLOOD SUGAR TWICE A DAY AND PRN 07/29/15   [name], MD  hydrALAZINE (APRESOLINE) 50 MG tablet Take 1 tablet (50 mg total) by mouth 3 (three) times daily. Patient taking differently: Take 50 mg by mouth every morning. 03/01/21   [name], [name], MD  ONE TOUCH LANCETS MISC USE TO CHECK BLOOD SUGAR TWICE A DAY AND PRN 07/29/15   [name], MD  topiramate (TOPAMAX) 25 MG tablet Take 1 tablet (25 mg total) by mouth 2 (two) times daily. Patient not taking: Reported on 09/16/2021 02/08/21 07/23/21  [name], [name], MD     Family History  Problem Relation Age of Onset   Diabetes Sister    Asthma Other    Stroke Other    Hypertension Other    Diabetes Mother    Stroke Mother    Cancer Father        pt states hae had some kind of stomach cancer, ? stomach or colon   Breast cancer Neg Hx     Social History   Socioeconomic History   Marital status: Divorced    Spouse name: Not on file   Number of children: 0   Years of education: 13   Highest education level: Not on file  Occupational History   Occupation: custodian at [employer]	[place]  Tobacco Use   Smoking status: Never   Smokeless tobacco: Never  Vaping Use   Vaping Use: Never used  Substance and Sexual Activity   Alcohol use: No   Drug use: No   Sexual activity: Not on file  Other Topics Concern   Not on file  Social History Narrative   Right Handed    Lives in a one story home    Social Determinants of Health   Financial Resource Strain: Not on file  Food Insecurity: Not on file  Transportation Needs: Not on file  Physical Activity: Not on file  Stress: Not on file  Social Connections: Not on file    Review of Systems: A 12 point ROS discussed and pertinent positives are indicated in the HPI above.  All other systems are negative.  Review of Systems   Constitutional:  Negative for chills and fever.  HENT:  Positive for tinnitus.   Eyes:  Positive for visual disturbance.  Respiratory:  Positive for shortness of breath. Negative for cough.   Cardiovascular:  Negative for chest pain.  Gastrointestinal:  Negative for abdominal pain, nausea and vomiting.  Musculoskeletal:  Positive for gait problem.  Neurological:  Positive for dizziness, weakness, light-headedness and headaches.   Vital Signs: BP (!) 194/92 (BP Location: Right Arm)   Pulse (!) 49   Temp 97.9 F (36.6 C) (Oral)   Resp 20   Ht '5\' 4"'$  (1.626 m)   Wt 199 lb 4.7 oz (90.4 kg)   SpO2 98%   BMI 34.21 kg/m   Physical  Exam Vitals reviewed.  Constitutional:      Appearance: Normal appearance.  HENT:     Head: Normocephalic and atraumatic.     Mouth/Throat:     Mouth: Mucous membranes are moist.     Pharynx: Oropharynx is clear.  Eyes:     Extraocular Movements: Extraocular movements intact.     Pupils: Pupils are equal, round, and reactive to light.  Cardiovascular:     Rate and Rhythm: Regular rhythm. Bradycardia present.     Pulses: Normal pulses.     Heart sounds: Normal heart sounds.  Pulmonary:     Effort: Pulmonary effort is normal. No respiratory distress.     Breath sounds: Normal breath sounds.  Abdominal:     General: Bowel sounds are normal. There is no distension.     Palpations: Abdomen is soft.     Tenderness: There is no abdominal tenderness. There is no guarding.  Skin:    General: Skin is warm and dry.  Neurological:     Mental Status: She is alert and oriented to person, place, and time.     Comments: Alert, aware and oriented X 3 Speech and comprehension is intact.  PERRL bilaterally No facial droop noted Tongue midline Can spontaneously move all 4 extremities. Hand grip strength equal bilaterally. Negative pronator drift. Fine motor and coordination intact.   Gait not assessed Romberg not assessed Heel to toe not assessed Distal  pulses not assessed       Psychiatric:        Mood and Affect: Mood normal.        Behavior: Behavior normal.        Thought Content: Thought content normal.        Judgment: Judgment normal.    Imaging: CT ANGIO HEAD NECK W WO CM  Addendum Date: 09/16/2021   ADDENDUM REPORT: 09/16/2021 00:01 ADDENDUM: The patient has a history of hematologic malignancy, which is compatible with the lymphadenopathy described in the original report. Electronically Signed   By: Ulyses Jarred M.D.   On: 09/16/2021 00:01   Result Date: 09/16/2021 CLINICAL DATA:  Dizziness EXAM: CT ANGIOGRAPHY HEAD AND NECK TECHNIQUE: Multidetector CT imaging of the head and neck was performed using the standard protocol during bolus administration of intravenous contrast. Multiplanar CT image reconstructions and MIPs were obtained to evaluate the vascular anatomy. Carotid stenosis measurements (when applicable) are obtained utilizing NASCET criteria, using the distal internal carotid diameter as the denominator. RADIATION DOSE REDUCTION: This exam was performed according to the departmental dose-optimization program which includes automated exposure control, adjustment of the mA and/or kV according to patient size and/or use of iterative reconstruction technique. CONTRAST:  42m OMNIPAQUE IOHEXOL 350 MG/ML SOLN COMPARISON:  MRA head 02/07/2021 FINDINGS: CTA NECK FINDINGS SKELETON: There is no bony spinal canal stenosis. No lytic or blastic lesion. OTHER NECK: Heterogeneous thyroid gland with 1.6 cm right thyroid nodule. There are multiple enlarged bilateral cervical lymph nodes. At level left 2A is a node measuring 13 mm. At level right 1B is a 15 mm node. UPPER CHEST: No pneumothorax or pleural effusion. No nodules or masses. AORTIC ARCH: There is calcific atherosclerosis of the aortic arch. There is no aneurysm, dissection or hemodynamically significant stenosis of the visualized portion of the aorta. Rights of the visualized  proximal subclavian arteries are widely patent. RIGHT CAROTID SYSTEM: No dissection, occlusion or aneurysm. Mild atherosclerotic calcification at the carotid bifurcation without hemodynamically significant stenosis. LEFT CAROTID SYSTEM: No dissection, occlusion or aneurysm.  Mild atherosclerotic calcification at the carotid bifurcation without hemodynamically significant stenosis. VERTEBRAL ARTERIES: Left dominant configuration. Both origins are clearly patent. There is no dissection, occlusion or flow-limiting stenosis to the skull base (V1-V3 segments). CTA HEAD FINDINGS POSTERIOR CIRCULATION: --Vertebral arteries: Both distal V4 segments are occluded. This appears to have progressed since the MRA of 02/07/2021. --Inferior cerebellar arteries: Normal. --Basilar artery: Patent but diminutive. --Superior cerebellar arteries: Normal. --Posterior cerebral arteries (PCA): Normal. ANTERIOR CIRCULATION: --Intracranial internal carotid arteries: Atherosclerotic calcification of the internal carotid arteries at the skull base without hemodynamically significant stenosis. --Anterior cerebral arteries (ACA): Normal. Both A1 segments are present. Patent anterior communicating artery (a-comm). --Middle cerebral arteries (MCA): Normal. VENOUS SINUSES: As permitted by contrast timing, patent. ANATOMIC VARIANTS: None Review of the MIP images confirms the above findings. IMPRESSION: 1. Bilateral distal V4 segment occlusion, which appears to have progressed since the MRA of 02/07/2021. 2. 1.6 cm incidental right thyroid nodule with heterogeneous and enlarged thyroid. Recommend thyroid US. Reference: J Am Coll Radiol. 2015 Feb;12(2): 143-50 3. Multiple enlarged bilateral cervical lymph nodes, of uncertain etiology but concerning for lymphoproliferative disorder. Aortic Atherosclerosis (ICD10-I70.0). Electronically Signed: By: Ulyses Jarred M.D. On: 09/15/2021 23:14   CT Head Wo Contrast  Result Date: 09/15/2021 CLINICAL DATA:   Dizziness. EXAM: CT HEAD WITHOUT CONTRAST TECHNIQUE: Contiguous axial images were obtained from the base of the skull through the vertex without intravenous contrast. RADIATION DOSE REDUCTION: This exam was performed according to the departmental dose-optimization program which includes automated exposure control, adjustment of the mA and/or kV according to patient size and/or use of iterative reconstruction technique. COMPARISON:  Head CT dated 04/09/2020. FINDINGS: Brain: The ventricles and sulci are appropriate size for the patient's age. The gray-white matter discrimination is preserved. There is no acute intracranial hemorrhage. No mass effect or midline shift. No extra-axial fluid collection. Vascular: No hyperdense vessel or unexpected calcification. Skull: Normal. Negative for fracture or focal lesion. Sinuses/Orbits: No acute finding. Other: None IMPRESSION: No acute intracranial pathology. Electronically Signed   By: Anner Crete M.D.   On: 09/15/2021 17:17   MR BRAIN WO CONTRAST  Result Date: 09/15/2021 CLINICAL DATA:  Dizziness EXAM: MRI HEAD WITHOUT CONTRAST TECHNIQUE: Multiplanar, multiecho pulse sequences of the brain and surrounding structures were obtained without intravenous contrast. COMPARISON:  None Available. FINDINGS: Brain: There are small foci of abnormal diffusion restriction within both cerebellar hemispheres. Chronic microhemorrhage in the right thalamus. There is multifocal hyperintense T2-weighted signal within the white matter. Parenchymal volume and CSF spaces are normal. The midline structures are normal. Vascular: Major flow voids are preserved. Skull and upper cervical spine: Normal calvarium and skull base. Visualized upper cervical spine and soft tissues are normal. Sinuses/Orbits:No paranasal sinus fluid levels or advanced mucosal thickening. No mastoid or middle ear effusion. Normal orbits. IMPRESSION: 1. Small foci of acute ischemia within both cerebellar hemispheres.  No hemorrhage or mass effect. 2. Findings of chronic small vessel ischemia. Electronically Signed   By: Ulyses Jarred M.D.   On: 09/15/2021 21:50   ECHOCARDIOGRAM COMPLETE  Result Date: 09/16/2021    ECHOCARDIOGRAM REPORT   Patient Name:   [name] Date of Exam: 09/16/2021 Medical Rec #:  [id number]       Height:       64.0 in Accession #:    [id number]      Weight:       199.3 lb Date of Birth:  06/08/1960       BSA:  1.953 m Patient Age:    63 years        BP:           137/73 mmHg Patient Gender: F               HR:           42 bpm. Exam Location:  Inpatient Procedure: 2D Echo, 3D Echo, Cardiac Doppler, Color Doppler and Intracardiac            Opacification Agent Indications:    Stroke  History:        Patient has prior history of Echocardiogram examinations, most                 recent 02/08/2021. Stroke; Risk Factors:Dyslipidemia, Diabetes                 and Hypertension.  Sonographer:    Roseanna Rainbow RDCS Referring Phys: [id number] Candace Gallus [facility]  Sonographer Comments: Patient is morbidly obese, Technically difficult study due to poor echo windows and suboptimal apical window. Image acquisition challenging due to patient body habitus. IMPRESSIONS  1. Left ventricular ejection fraction, by estimation, is 60 to 65%. The left ventricle has normal function. The left ventricle has no regional wall motion abnormalities. Left ventricular diastolic parameters are consistent with Grade II diastolic dysfunction (pseudonormalization). Elevated left ventricular end-diastolic pressure.  2. Right ventricular systolic function is normal. The right ventricular size is normal.  3. The mitral valve is abnormal. Mild mitral valve regurgitation. No evidence of mitral stenosis.  4. The aortic valve is normal in structure. Aortic valve regurgitation is not visualized. No aortic stenosis is present.  5. The inferior vena cava is normal in size with greater than 50% respiratory variability, suggesting right atrial  pressure of 3 mmHg. FINDINGS  Left Ventricle: Left ventricular ejection fraction, by estimation, is 60 to 65%. The left ventricle has normal function. The left ventricle has no regional wall motion abnormalities. Definity contrast agent was given IV to delineate the left ventricular  endocardial borders. The left ventricular internal cavity size was normal in size. There is no left ventricular hypertrophy. Left ventricular diastolic parameters are consistent with Grade II diastolic dysfunction (pseudonormalization). Elevated left ventricular end-diastolic pressure. Right Ventricle: The right ventricular size is normal. No increase in right ventricular wall thickness. Right ventricular systolic function is normal. Left Atrium: Left atrial size was normal in size. Right Atrium: Right atrial size was normal in size. Pericardium: There is no evidence of pericardial effusion. Mitral Valve: The mitral valve is abnormal. There is mild thickening of the mitral valve leaflet(s). There is mild calcification of the mitral valve leaflet(s). Mild mitral valve regurgitation. No evidence of mitral valve stenosis. MV peak gradient, 6.1 mmHg. The mean mitral valve gradient is 2.0 mmHg. Tricuspid Valve: The tricuspid valve is normal in structure. Tricuspid valve regurgitation is not demonstrated. No evidence of tricuspid stenosis. Aortic Valve: The aortic valve is normal in structure. Aortic valve regurgitation is not visualized. No aortic stenosis is present. Pulmonic Valve: The pulmonic valve was normal in structure. Pulmonic valve regurgitation is not visualized. No evidence of pulmonic stenosis. Aorta: The aortic root is normal in size and structure. Venous: The inferior vena cava is normal in size with greater than 50% respiratory variability, suggesting right atrial pressure of 3 mmHg. IAS/Shunts: No atrial level shunt detected by color flow Doppler.  LEFT VENTRICLE PLAX 2D LVIDd:         4.30 cm  Diastology LVIDs:          2.90 cm      LV e' medial:    4.16 cm/s LV PW:         1.40 cm      LV E/e' medial:  27.9 LV IVS:        1.00 cm      LV e' lateral:   6.01 cm/s LVOT diam:     2.30 cm      LV E/e' lateral: 19.3 LV SV:         114 LV SV Index:   58 LVOT Area:     4.15 cm                              3D Volume EF: LV Volumes (MOD)            3D EF:        64 % LV vol d, MOD A2C: 122.0 ml LV EDV:       242 ml LV vol d, MOD A4C: 170.0 ml LV ESV:       88 ml LV vol s, MOD A2C: 47.4 ml  LV SV:        155 ml LV vol s, MOD A4C: 59.9 ml LV SV MOD A2C:     74.6 ml LV SV MOD A4C:     170.0 ml LV SV MOD BP:      95.1 ml RIGHT VENTRICLE            IVC RV S prime:     9.76 cm/s  IVC diam: 2.10 cm TAPSE (M-mode): 2.0 cm LEFT ATRIUM             Index        RIGHT ATRIUM           Index LA diam:        3.40 cm 1.74 cm/m   RA Area:     10.70 cm LA Vol (A2C):   63.7 ml 32.61 ml/m  RA Volume:   20.00 ml  10.24 ml/m LA Vol (A4C):   72.4 ml 37.06 ml/m LA Biplane Vol: 68.3 ml 34.96 ml/m  AORTIC VALVE LVOT Vmax:   83.90 cm/s LVOT Vmean:  57.000 cm/s LVOT VTI:    0.274 m  AORTA Ao Root diam: 3.30 cm Ao Asc diam:  3.45 cm MITRAL VALVE MV Area (PHT): 2.69 cm     SHUNTS MV Area VTI:   2.32 cm     Systemic VTI:  0.27 m MV Peak grad:  6.1 mmHg     Systemic Diam: 2.30 cm MV Mean grad:  2.0 mmHg MV Vmax:       1.23 m/s MV Vmean:      53.2 cm/s MV Decel Time: 282 msec MV E velocity: 116.00 cm/s MV A velocity: 79.60 cm/s MV E/A ratio:  1.46 Jenkins Rouge MD Electronically signed by Jenkins Rouge MD Signature Date/Time: 09/16/2021/9:08:45 AM    Final     Labs:  CBC: Recent Labs    02/06/21 1348 03/08/21 1433 09/06/21 0953 09/15/21 1802  WBC 6.1 5.1 5.0 7.9  HGB 13.0 11.9* 12.2 11.9*  HCT 38.7 36.4 36.2 36.8  PLT 257 263 235 292    COAGS: No results for input(s): INR, APTT in the last 8760 hours.  BMP: Recent Labs    02/07/21 0047 03/08/21 1433 09/06/21 0953 09/15/21 1802  NA  135 138 137 138  K 3.8 4.0 4.1 4.0  CL 103 106 103 103   CO2 '24 25 28 25  '$ GLUCOSE 164* 268* 238* 112*  BUN 23 30* 30* 27*  CALCIUM 9.9 9.7 9.9 10.0  CREATININE 1.11* 1.15* 1.21* 1.13*  GFRNONAA 57* 54* 51* 55*    LIVER FUNCTION TESTS: Recent Labs    03/08/21 1433 09/06/21 0953  BILITOT 0.4 0.3  AST 15 19  ALT 20 28  ALKPHOS 98 98  PROT 7.4 7.2  ALBUMIN 3.3* 3.2*    TUMOR MARKERS: No results for input(s): AFPTM, CEA, CA199, CHROMGRNA in the last 8760 hours.  Assessment and Plan: History of obesity, DM II, HLD, HTN, PAD, GERD and CVA. Pt presented to [place] ED 09/15/21 c/o dizziness and unsteady gait. She was transferred to [facility] ED and had MRI brain to r/o acute stroke. MRI demonstrated small foci of acute ischemia within both cerebellar hemispheres. Pt underwent CT angio head/neck that showed bilateral distal V4 segment occlusion that has progressed since last study 02/07/2021. Dr. Frazier Richards, IR, recommends diagnostic cerebral angiogram with moderate sedation. Procedure is tentatively scheduled for 09/17/21.   Pt sitting up in recliner. She is A&O, calm and pleasant.  She is in no distress. Pt understands that she will be NPO at [place]. Pt expresses concern for hypoglycemia while she is sedated. She has had CBG in 40's while admitted.      Risks and benefits of cerebral angiogram with intervention were discussed with the patient including, but not limited to bleeding, infection, vascular injury, contrast induced renal failure, stroke or even death.  This interventional procedure involves the use of X-rays and because of the nature of the planned procedure, it is possible that we will have prolonged use of X-ray fluoroscopy.  Potential radiation risks to you include (but are not limited to) the following: - A slightly elevated risk for cancer  several years later in life. This risk is typically less than 0.5% percent. This risk is low in comparison to the normal incidence of human cancer, which is 33% for women and 50% for men  according to the [employer]. - Radiation induced injury can include skin redness, resembling a rash, tissue breakdown / ulcers and hair loss (which can be temporary or permanent).   The likelihood of either of these occurring depends on the difficulty of the procedure and whether you are sensitive to radiation due to previous procedures, disease, or genetic conditions.   IF your procedure requires a prolonged use of radiation, you will be notified and given written instructions for further action.  It is your responsibility to monitor the irradiated area for the 2 weeks following the procedure and to notify your physician if you are concerned that you have suffered a radiation induced injury.    All of the patient's questions were answered, patient is agreeable to proceed.  Consent signed and in chart. Thank you for this interesting consult.  I greatly enjoyed meeting Jazzalyn Loewenstein and look forward to participating in their care.  A copy of this report was sent to the requesting provider on this date.  Electronically Signed: Tyson Alias, NP 09/16/2021, 1:23 PM   I spent a total of 20 minutes in face to face in clinical consultation, greater than 50% of which was counseling/coordinating care for diagnostic cerebral angiogram.

## 2021-09-16 NOTE — Assessment & Plan Note (Signed)
-   Outpatient [place]

## 2021-09-16 NOTE — Progress Notes (Signed)
  Progress Note   Patient: [name]:[id number] DOB: August 30, 1960 DOA: 09/15/2021     0 DOS: the patient was seen and examined on 09/16/2021 at 11:24AM      Brief hospital course: [name] is a 61 y.o. F with DM, HTN, lymphoma, obesity, PVD, and recent stroke who presented with acute ataxia.  In the ER, MRI showed acute infarcts in the cerebellum.  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       Assessment and Plan: * Acute CVA (cerebrovascular accident) ([place]) - Consult IR for evaluation of posterior circulation stenoses - Echocardiogram showed no cardiogenic source of embolism - Carotid imaging unremarkable   - Lipids ordered: LDL 240, continue atorvastat and Zetia -Aspirin ordered at admission --> continue aspirin and Plavix -Atrial fibrillation: not present on tele    Cervical lymphadenopathy Likely due to lymphoma, will defer to outpatient Oncology follow up  Thyroid nodule - Outpatient [place]  Stage 3a chronic kidney disease (CKD) (HCC) Cr stable relative to baseline  GERD (gastroesophageal reflux disease) - Continue PPI  Peripheral arterial disease (HCC)    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  No double vision, no headache.     Physical Exam: Vitals:   09/16/21 0328 09/16/21 0740 09/16/21 1207 09/16/21 1602  BP: (!) 159/95 (!) 167/73 (!) 194/92 (!) 146/74  Pulse: (!) 49 (!) 47 (!) 49 (!) 49  Resp: '16 19 20 18  '$ Temp: 97.8 F (36.6 C) 97.8 F (36.6 C)  97.9 F (36.6 C) 98.1 F (36.7 C)  TempSrc: Oral Oral Oral Oral  SpO2: 97% 98% 98% 94%  Weight: 90.4 kg     Height: '5\' 4"'$  (1.626 m)      Adult female, sitting up in recliner eating lunch, no acute distress RRR, no murmurs, no peripheral edema Respiratory rate normal, lungs clear without rales or wheezes Upper extremity strength appears globally weak, but symmetric, finger-nose testing is bilaterally abnormal, she has mild nystagmus, worse to the left, speech fluent, face symmetric   Data Reviewed: Discussed with neurology, nursing notes reviewed, vital signs reviewed LDL was reviewed, A1c is 12.9% BMP in CBC are unremarkable except for creatinine 1.1 CT angiogram shows distal V4 occlusion  Family Communication: At the bedside    Disposition: Status is: Observation The patient will require care spanning > 2 midnights and should be moved to inpatient because: She has had a stroke, and has decreased activity tolerance decreased balance, decreased mobility, and decreased safety awareness  In addition, she has had recurrent strokes now twice in the last 6 months, and neurology have recommended angiography tomorrow and potentially stenting of her vertebral arteries        Author: Edwin Dada, MD 09/16/2021 4:58 PM  For on call review [email or web address].

## 2021-09-16 NOTE — Evaluation (Signed)
Physical Therapy Evaluation Patient Details Name: [name] MRN: [id number] DOB: Aug 02, 1960 Today's Date: 09/16/2021  History of Present Illness  Pt is a 61 year old woman admitted on 09/15/21 with unstable gait and dizziness. MRI + B cerebellar infarcts. PMH: DM, HLD, HTN, small cell lymphocitic impairment, PAD, LE neuropathy, CVA, GBS, obesity.  Clinical Impression  Pt admitted with above diagnosis. Pt was able to ambulate with cane with min guard assist with pt being cautious.   Pt reports she is not at baseline and recognizes need for continued therapy and that she should stay with her sister.  Her sister has a flight of stairs that pt will need to practice prior to going home.  Pt also appears to have a right hypofunction of vestibular system and intiiated x 1 exercises with pt.   Pt currently with functional limitations due to the deficits listed below (see PT Problem List). Pt will benefit from skilled PT to increase their independence and safety with mobility to allow discharge to the venue listed below.          Recommendations for follow up therapy are one component of a multi-disciplinary discharge planning process, led by the attending physician.  Recommendations may be updated based on patient status, additional functional criteria and insurance authorization.  Follow Up Recommendations Home health PT    Assistance Recommended at Discharge Intermittent Supervision/Assistance  Patient can return home with the following  A little help with walking and/or transfers;A little help with bathing/dressing/bathroom;Help with stairs or ramp for entrance    Equipment Recommendations Rolling walker (2 wheels)  Recommendations for Other Services       Functional Status Assessment Patient has had a recent decline in their functional status and demonstrates the ability to make significant improvements in function in a reasonable and predictable amount of time.     Precautions /  Restrictions Precautions Precautions: Fall Restrictions Weight Bearing Restrictions: No      Mobility  Bed Mobility Overal bed mobility: Needs Assistance Bed Mobility: Supine to Sit     Supine to sit: Supervision     General bed mobility comments: HOB up, pt with brief c/o dizziness    Transfers Overall transfer level: Needs assistance Equipment used: Straight cane Transfers: Sit to/from Stand Sit to Stand: Min guard           General transfer comment: denies dizziness upon standing, but cued to stand momentarily    Ambulation/Gait Ambulation/Gait assistance: Min guard, Min assist Gait Distance (Feet): 50 Feet Assistive device: Straight cane Gait Pattern/deviations: Step-through pattern, Decreased stride length       General Gait Details: Pt able to ambulate to bathroom and back to chair with cane and cues at times.  Pt did well overall with cane. Pt very cautious and slow.  No LOB with cane but did not challenge balance.  Stairs            Wheelchair Mobility    Modified Rankin (Stroke Patients Only)       Balance Overall balance assessment: Needs assistance   Sitting balance-Leahy Scale: Good     Standing balance support: Single extremity supported Standing balance-Leahy Scale: Poor Standing balance comment: tentative, reliant on cane and min guard assist                             Pertinent Vitals/Pain Pain Assessment Pain Assessment: No/denies pain    Home Living Family/patient expects to be  discharged to:: Private residence Living Arrangements: Alone Available Help at Discharge: Family;Available 24 hours/day (sister can stay with her) Type of Home: Apartment Home Access: Stairs to enter Entrance Stairs-Rails: None Entrance Stairs-Number of Steps: 3   Home Layout: One level Home Equipment: Cane - single point Additional Comments: No falls last 6 months    Prior Function Prior Level of Function : Independent/Modified  Independent;Driving             Mobility Comments: walks with a cane       Hand Dominance   Dominant Hand: Right    Extremity/Trunk Assessment   Upper Extremity Assessment Upper Extremity Assessment: Defer to OT evaluation    Lower Extremity Assessment Lower Extremity Assessment: Generalized weakness    Cervical / Trunk Assessment Cervical / Trunk Assessment: Normal  Communication   Communication: No difficulties  Cognition Arousal/Alertness: Awake/alert Behavior During Therapy: WFL for tasks assessed/performed Overall Cognitive Status: Within Functional Limits for tasks assessed                                 General Comments: some difficulty complying to requirements of testing for vertigo        General Comments General comments (skin integrity, edema, etc.): BP 215/89 initially with sats 93% on RA.  BP on departure 186/83.    Exercises     Assessment/Plan    PT Assessment Patient needs continued PT services  PT Problem List Decreased activity tolerance;Decreased balance;Decreased mobility;Decreased knowledge of use of DME;Decreased safety awareness;Decreased knowledge of precautions       PT Treatment Interventions DME instruction;Stair training;Functional mobility training;Gait training;Balance training;Patient/family education;Therapeutic exercise;Therapeutic activities    PT Goals (Current goals can be found in the Care Plan section)  Acute Rehab PT Goals Patient Stated Goal: to go home PT Goal Formulation: With patient Time For Goal Achievement: 09/30/21 Potential to Achieve Goals: Good    Frequency Min 4X/week     Co-evaluation PT/OT/SLP Co-Evaluation/Treatment: Yes Reason for Co-Treatment: Complexity of the patient's impairments (multi-system involvement);For patient/therapist safety PT goals addressed during session: Mobility/safety with mobility         AM-PAC PT "6 Clicks" Mobility  Outcome Measure Help needed  turning from your back to your side while in a flat bed without using bedrails?: None Help needed moving from lying on your back to sitting on the side of a flat bed without using bedrails?: A Little Help needed moving to and from a bed to a chair (including a wheelchair)?: A Little Help needed standing up from a chair using your arms (e.g., wheelchair or bedside chair)?: A Little Help needed to walk in hospital room?: A Little Help needed climbing 3-5 steps with a railing? : A Little 6 Click Score: 19    End of Session Equipment Utilized During Treatment: Gait belt Activity Tolerance: Patient limited by fatigue Patient left: in chair;with call bell/phone within reach;with chair alarm set Nurse Communication: Mobility status PT Visit Diagnosis: Muscle weakness (generalized) (M62.81);Dizziness and giddiness (R42)    Time: 6734-1937 PT Time Calculation (min) (ACUTE ONLY): 32 min   Charges:   PT Evaluation $PT Eval Moderate Complexity: 1 Mod          Corbitt Cloke M,PT Acute Rehab Services [phone] (pager)   Alvira Philips 09/16/2021, 1:37 PM

## 2021-09-16 NOTE — Hospital Course (Signed)
[name] is a 61 y.o. F with DM, HTN, lymphoma, obesity, PVD, and recent stroke who presented with acute ataxia.  In the ER, MRI showed acute infarcts in the cerebellum.  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

## 2021-09-16 NOTE — Assessment & Plan Note (Signed)
Cr stable relative to baseline 

## 2021-09-16 NOTE — Progress Notes (Signed)
Hypoglycemic Event  Patient used call bell to report "I feel like my sugar is low."  Time: 0641 Blood Glucose: 40  Symptoms: Diaphoretic  Treatment: 25g/15m Dextrose 50% Injection  Provider notified: Hall MD  Time:: 2992Blood Glucose: 178

## 2021-09-16 NOTE — TOC CAGE-AID Note (Signed)
Transition of Care [facility]) - CAGE-AID Screening   Patient Details  Name: [name] MRN: [id number] Date of Birth: Oct 05, 1960  Transition of Care [facility]) CM/SW Contact:    Danalee Flath C Tarpley-Carter, LCSWA Phone Number: 09/16/2021, 9:50 AM   Clinical Narrative: Pt participated in [place].  Pt stated she does not use substance or ETOH.  Pt was not offered resources, due to no usage of substance or ETOH.    Karlene Southard Tarpley-Carter, MSW, LCSW-A Pronouns:  She/Her/Hers [facility]Transitions of Care Clinical Social Worker Direct Number:  [phone] [email or web address]  CAGE-AID Screening:    Have You Ever Felt You Ought to Cut Down on Your Drinking or Drug Use?: No Have People Annoyed You By [employer] Your Drinking Or Drug Use?: No Have You Felt Bad Or Guilty About Your Drinking Or Drug Use?: No Have You Ever Had a Drink or Used Drugs First Thing In The Morning to Steady Your Nerves or to Get Rid of a Hangover?: No CAGE-AID Score: 0  Substance Abuse Education Offered: No

## 2021-09-16 NOTE — Assessment & Plan Note (Signed)
Hypoglycemia overnight On excessive long acting insulin - Hold glargine - Continue SS corrections q4h - Will use D5W overnight while NPO

## 2021-09-16 NOTE — Progress Notes (Signed)
Inpatient Diabetes Program Recommendations  AACE/ADA: New Consensus Statement on Inpatient Glycemic Control (2015)  Target Ranges:  Prepandial:   less than 140 mg/dL      Peak postprandial:   less than 180 mg/dL (1-2 hours)      Critically ill patients:  140 - 180 mg/dL   Lab Results  Component Value Date   GLUCAP 110 (H) 09/16/2021   HGBA1C 12.9 (H) 09/16/2021    Review of Glycemic Control  Latest Reference Range & Units 09/16/21 08:02 09/16/21 08:58 09/16/21 09:54  Glucose-Capillary 70 - 99 mg/dL 87 50 (L) 110 (H)   Diabetes history: DM 2 Outpatient Diabetes medications:  Lantus 160 units daily Current orders for Inpatient glycemic control:  Novolog moderate tid with meals and HS Inpatient Diabetes Program Recommendations:   Note low blood sugars.  Patient will likely need reduction in Lantus as this is likely too much basal insulin.  A1C=12.9%.  Will follow.   Thanks,  Adah Perl, RN, BC-ADM Inpatient Diabetes Coordinator Pager [phone]  (8a-5p)

## 2021-09-17 ENCOUNTER — Other Ambulatory Visit (HOSPITAL_COMMUNITY): Payer: Self-pay

## 2021-09-17 ENCOUNTER — Observation Stay (HOSPITAL_COMMUNITY): Payer: 59

## 2021-09-17 DIAGNOSIS — Z833 Family history of diabetes mellitus: Secondary | ICD-10-CM | POA: Diagnosis not present

## 2021-09-17 DIAGNOSIS — F32A Depression, unspecified: Secondary | ICD-10-CM | POA: Diagnosis present

## 2021-09-17 DIAGNOSIS — E1122 Type 2 diabetes mellitus with diabetic chronic kidney disease: Secondary | ICD-10-CM | POA: Diagnosis present

## 2021-09-17 DIAGNOSIS — E1151 Type 2 diabetes mellitus with diabetic peripheral angiopathy without gangrene: Secondary | ICD-10-CM | POA: Diagnosis present

## 2021-09-17 DIAGNOSIS — K219 Gastro-esophageal reflux disease without esophagitis: Secondary | ICD-10-CM | POA: Diagnosis present

## 2021-09-17 DIAGNOSIS — I639 Cerebral infarction, unspecified: Secondary | ICD-10-CM | POA: Diagnosis not present

## 2021-09-17 DIAGNOSIS — I63213 Cerebral infarction due to unspecified occlusion or stenosis of bilateral vertebral arteries: Secondary | ICD-10-CM | POA: Diagnosis present

## 2021-09-17 DIAGNOSIS — Z90710 Acquired absence of both cervix and uterus: Secondary | ICD-10-CM | POA: Diagnosis not present

## 2021-09-17 DIAGNOSIS — Z8249 Family history of ischemic heart disease and other diseases of the circulatory system: Secondary | ICD-10-CM | POA: Diagnosis not present

## 2021-09-17 DIAGNOSIS — C8581 Other specified types of non-Hodgkin lymphoma, lymph nodes of head, face, and neck: Secondary | ICD-10-CM | POA: Diagnosis present

## 2021-09-17 DIAGNOSIS — E785 Hyperlipidemia, unspecified: Secondary | ICD-10-CM | POA: Diagnosis present

## 2021-09-17 DIAGNOSIS — R4182 Altered mental status, unspecified: Secondary | ICD-10-CM | POA: Diagnosis present

## 2021-09-17 DIAGNOSIS — I651 Occlusion and stenosis of basilar artery: Secondary | ICD-10-CM | POA: Diagnosis present

## 2021-09-17 DIAGNOSIS — I10 Essential (primary) hypertension: Secondary | ICD-10-CM | POA: Diagnosis not present

## 2021-09-17 DIAGNOSIS — E669 Obesity, unspecified: Secondary | ICD-10-CM | POA: Diagnosis present

## 2021-09-17 DIAGNOSIS — Z794 Long term (current) use of insulin: Secondary | ICD-10-CM | POA: Diagnosis not present

## 2021-09-17 DIAGNOSIS — Z823 Family history of stroke: Secondary | ICD-10-CM | POA: Diagnosis not present

## 2021-09-17 DIAGNOSIS — Z8673 Personal history of transient ischemic attack (TIA), and cerebral infarction without residual deficits: Secondary | ICD-10-CM | POA: Diagnosis not present

## 2021-09-17 DIAGNOSIS — Z6834 Body mass index (BMI) 34.0-34.9, adult: Secondary | ICD-10-CM | POA: Diagnosis not present

## 2021-09-17 DIAGNOSIS — Z809 Family history of malignant neoplasm, unspecified: Secondary | ICD-10-CM | POA: Diagnosis not present

## 2021-09-17 DIAGNOSIS — N1831 Chronic kidney disease, stage 3a: Secondary | ICD-10-CM | POA: Diagnosis present

## 2021-09-17 DIAGNOSIS — E1169 Type 2 diabetes mellitus with other specified complication: Secondary | ICD-10-CM | POA: Diagnosis present

## 2021-09-17 DIAGNOSIS — I129 Hypertensive chronic kidney disease with stage 1 through stage 4 chronic kidney disease, or unspecified chronic kidney disease: Secondary | ICD-10-CM | POA: Diagnosis present

## 2021-09-17 DIAGNOSIS — E11649 Type 2 diabetes mellitus with hypoglycemia without coma: Secondary | ICD-10-CM | POA: Diagnosis present

## 2021-09-17 DIAGNOSIS — Z79899 Other long term (current) drug therapy: Secondary | ICD-10-CM | POA: Diagnosis not present

## 2021-09-17 DIAGNOSIS — Z20822 Contact with and (suspected) exposure to covid-19: Secondary | ICD-10-CM | POA: Diagnosis present

## 2021-09-17 DIAGNOSIS — E041 Nontoxic single thyroid nodule: Secondary | ICD-10-CM | POA: Diagnosis present

## 2021-09-17 HISTORY — PX: IR ANGIO INTRA EXTRACRAN SEL INTERNAL CAROTID UNI R MOD SED: IMG5362

## 2021-09-17 HISTORY — PX: IR ANGIO INTRA EXTRACRAN SEL COM CAROTID INNOMINATE UNI L MOD SED: IMG5358

## 2021-09-17 HISTORY — PX: IR US GUIDE VASC ACCESS RIGHT: IMG2390

## 2021-09-17 HISTORY — PX: IR ANGIO VERTEBRAL SEL VERTEBRAL UNI R MOD SED: IMG5368

## 2021-09-17 LAB — BASIC METABOLIC PANEL
Anion gap: 7 (ref 5–15)
BUN: 30 mg/dL — ABNORMAL HIGH (ref 8–23)
CO2: 24 mmol/L (ref 22–32)
Calcium: 8.9 mg/dL (ref 8.9–10.3)
Chloride: 106 mmol/L (ref 98–111)
Creatinine, Ser: 1.29 mg/dL — ABNORMAL HIGH (ref 0.44–1.00)
GFR, Estimated: 47 mL/min — ABNORMAL LOW (ref >60)
Glucose, Bld: 195 mg/dL — ABNORMAL HIGH (ref 70–99)
Potassium: 3.9 mmol/L (ref 3.5–5.1)
Sodium: 137 mmol/L (ref 135–145)

## 2021-09-17 LAB — GLUCOSE, CAPILLARY
Glucose-Capillary: 195 mg/dL — ABNORMAL HIGH (ref 70–99)
Glucose-Capillary: 187 mg/dL — ABNORMAL HIGH (ref 70–99)
Glucose-Capillary: 234 mg/dL — ABNORMAL HIGH (ref 70–99)
Glucose-Capillary: 153 mg/dL — ABNORMAL HIGH (ref 70–99)
Glucose-Capillary: 444 mg/dL — ABNORMAL HIGH (ref 70–99)
Glucose-Capillary: 404 mg/dL — ABNORMAL HIGH (ref 70–99)
Glucose-Capillary: 269 mg/dL — ABNORMAL HIGH (ref 70–99)
Glucose-Capillary: 219 mg/dL — ABNORMAL HIGH (ref 70–99)

## 2021-09-17 MED ORDER — CLOPIDOGREL BISULFATE 75 MG PO TABS
75.0000 mg | ORAL_TABLET | Freq: Every day | ORAL | 3 refills | Status: DC
  Filled 2021-09-17 – 2021-10-14 (×2): qty 30, 30d supply, fill #0
  Filled 2021-12-15: qty 30, 30d supply, fill #1
  Filled 2022-03-03: qty 30, 30d supply, fill #2

## 2021-09-17 MED ORDER — ATROPINE SULFATE 1 MG/ML IV SOLN
INTRAVENOUS | Status: AC
  Filled 2021-09-17: qty 1

## 2021-09-17 MED ORDER — ASPIRIN 325 MG PO TABS
325.0000 mg | ORAL_TABLET | Freq: Every day | ORAL | 3 refills | Status: DC
  Filled 2021-09-17: qty 30, 30d supply, fill #0
  Filled 2022-04-13: qty 30, 30d supply, fill #1

## 2021-09-17 MED ORDER — HEPARIN SODIUM (PORCINE) 1000 UNIT/ML IJ SOLN
INTRAMUSCULAR | Status: DC | PRN
  Administered 2021-09-17: 3000 [IU] via INTRAVENOUS

## 2021-09-17 MED ORDER — ATORVASTATIN CALCIUM 80 MG PO TABS
80.0000 mg | ORAL_TABLET | Freq: Every day | ORAL | 3 refills | Status: DC
  Filled 2021-09-17 – 2021-10-14 (×2): qty 30, 30d supply, fill #0
  Filled 2021-12-15: qty 30, 30d supply, fill #1
  Filled 2022-03-03: qty 30, 30d supply, fill #2

## 2021-09-17 MED ORDER — MIDAZOLAM HCL 2 MG/2ML IJ SOLN
INTRAMUSCULAR | Status: DC | PRN
  Administered 2021-09-17: .5 mg via INTRAVENOUS
  Administered 2021-09-17: 1 mg via INTRAVENOUS

## 2021-09-17 MED ORDER — ASPIRIN 325 MG PO TABS
325.0000 mg | ORAL_TABLET | Freq: Every day | ORAL | Status: DC
  Administered 2021-09-17: 325 mg via ORAL
  Filled 2021-09-17: qty 1

## 2021-09-17 MED ORDER — ENOXAPARIN SODIUM 40 MG/0.4ML IJ SOSY: 40.0000 mg | PREFILLED_SYRINGE | INTRAMUSCULAR | Status: DC

## 2021-09-17 MED ORDER — IOHEXOL 300 MG/ML  SOLN
100.0000 mL | Freq: Once | INTRAMUSCULAR | Status: AC | PRN
  Administered 2021-09-17: 24 mL via INTRA_ARTERIAL

## 2021-09-17 MED ORDER — FENTANYL CITRATE (PF) 100 MCG/2ML IJ SOLN
INTRAMUSCULAR | Status: AC
  Filled 2021-09-17: qty 2

## 2021-09-17 MED ORDER — EZETIMIBE 10 MG PO TABS
10.0000 mg | ORAL_TABLET | Freq: Every day | ORAL | 3 refills | Status: DC
  Filled 2021-09-17 – 2021-10-14 (×2): qty 30, 30d supply, fill #0
  Filled 2021-12-15: qty 30, 30d supply, fill #1
  Filled 2022-03-03: qty 30, 30d supply, fill #2

## 2021-09-17 MED ORDER — CLOPIDOGREL BISULFATE 75 MG PO TABS: 75.0000 mg | ORAL_TABLET | Freq: Every day | ORAL | Status: DC

## 2021-09-17 MED ORDER — MIDAZOLAM HCL 2 MG/2ML IJ SOLN
INTRAMUSCULAR | Status: AC
  Filled 2021-09-17: qty 2

## 2021-09-17 MED ORDER — INSULIN GLARGINE-YFGN 100 UNIT/ML ~~LOC~~ SOLN
18.0000 [IU] | Freq: Once | SUBCUTANEOUS | Status: AC
Start: 2021-09-17 — End: 2021-09-17
  Administered 2021-09-17: 18 [IU] via SUBCUTANEOUS
  Filled 2021-09-17: qty 0.18

## 2021-09-17 MED ORDER — INSULIN GLARGINE 100 UNIT/ML SOLOSTAR PEN
16.0000 [IU] | PEN_INJECTOR | Freq: Every day | SUBCUTANEOUS | 3 refills | Status: DC
Start: 2021-09-17 — End: 2022-03-16

## 2021-09-17 MED ORDER — HEPARIN SODIUM (PORCINE) 1000 UNIT/ML IJ SOLN
INTRAMUSCULAR | Status: AC
  Filled 2021-09-17: qty 10

## 2021-09-17 MED ORDER — ASPIRIN 325 MG PO TABS: 325.0000 mg | ORAL_TABLET | Freq: Every day | ORAL | Status: DC

## 2021-09-17 MED ORDER — IOHEXOL 300 MG/ML  SOLN
100.0000 mL | Freq: Once | INTRAMUSCULAR | Status: AC | PRN
  Administered 2021-09-17: 30 mL via INTRA_ARTERIAL

## 2021-09-17 MED ORDER — FENTANYL CITRATE (PF) 100 MCG/2ML IJ SOLN
INTRAMUSCULAR | Status: DC | PRN
  Administered 2021-09-17: 50 ug via INTRAVENOUS
  Administered 2021-09-17 (×2): 25 ug via INTRAVENOUS

## 2021-09-17 MED ORDER — CLOPIDOGREL BISULFATE 75 MG PO TABS
75.0000 mg | ORAL_TABLET | Freq: Every day | ORAL | Status: DC
  Administered 2021-09-17: 75 mg via ORAL
  Filled 2021-09-17: qty 1

## 2021-09-17 MED ORDER — AMLODIPINE BESYLATE 10 MG PO TABS
10.0000 mg | ORAL_TABLET | Freq: Every day | ORAL | Status: DC
  Administered 2021-09-17: 10 mg via ORAL
  Filled 2021-09-17: qty 1

## 2021-09-17 MED ORDER — LIDOCAINE HCL 1 % IJ SOLN
INTRAMUSCULAR | Status: AC
  Administered 2021-09-17: 10 mL
  Filled 2021-09-17: qty 20

## 2021-09-17 NOTE — Discharge Summary (Signed)
Physician Discharge Summary   Patient: [name] MRN: [id number] DOB: 04-Mar-1961  Admit date:     09/15/2021  Discharge date: 09/17/21  Discharge Physician: [name]   PCP: Mata, Lisa G, MD     Recommendations at discharge:  Follow up with PCP Dr. [name] in 1 week Follow up with Neurology [name] in 6-8 weeks for new stroke Drs. Mata and [name]: Please not insulin use discrepancy - patient prescribed 160 units glargine as outpatient, BG was 45 mg/dL on admission, and in the hospital (albeit NPO for a few hours each day) only utilized 30 units SS correction insulin total over ~54 hours Dr. Irene Mata: Please note lymphadenopathy findings on CT below Dr. [name]: Please obtain US thyroid for incidental thyroid nodule     Discharge Diagnoses: Principal Problem:   Acute CVA (cerebrovascular accident) [facility]) Active Problems:   Hyperlipidemia associated with type 2 diabetes mellitus ([place])   Essential hypertension   DM (diabetes mellitus), type 2 with peripheral vascular complications ([place])   Small lymphocytic lymphoma ([place])   Peripheral arterial disease ([place])   GERD (gastroesophageal reflux disease)   Stage 3a chronic kidney disease (CKD) ([place])   Thyroid nodule   Cervical lymphadenopathy       Hospital Course: [name] is a 61 y.o. F with DM, HTN, lymphoma, obesity, PVD, and recent stroke who presented with acute ataxia.  In the ER, MRI showed acute infarcts in the cerebellum.  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     * Acute CVA (cerebrovascular accident) [name]) Patient admitted and MRI showed new cerebellar infarcts.  CT angiogram showed severe vertebral artery disease, worse than prior.  IR were consulted who took the patient for catheter angiogram which confirmed occluded vertebral, no intervention possible to open. Neurology recommended optimizing medical management.  Lipids ordered, LDL 240.  Echo showed no cardiogenic source.    Carotid imaging unremarkable.  Atrial fibrillation not present on tele.  Nonsmoker.  Discharged on aspirin 325 mg and Plavix daily.  Defer duration to Neurology as an outpatient.  Resumed atorvastatin and added Zetia.- Lipids ordered: LDL 240, continue atorvastat and Zetia   At the time of discharge, the patient had persistent ataxia but was ambulatory with guard.  She had persistent diplopia, slightly improving from admission.  Family were counseled to help closely with medications.     Cervical lymphadenopathy This was noted incidentally.  Have forwarded discharge summary to Oncology as FYI.     Thyroid nodule Incidental finding.  Recommend outpatient thyroid US.  DM (diabetes mellitus), type 2 with peripheral vascular complications ([facility]) Hypoglycemic on admission.  Discussed with diabetes coordinator.  Evidently patient experiencing lows prior to admission as well.  Drinking juice frequently to keep sugar up.  Her insulin needs while in the hospital were only 30 units total of 2.5 days (given NPO status at times and diabetic diet in hospital).    At discharge, insulin reduced.  Recommended close follow up with PCP.                The [facility] Controlled Substances Registry was reviewed for this patient prior to discharge.   Consultants: Neurolgy, Interventional Radiology   Disposition: Home Diet recommendation:  Discharge Diet Orders (From admission, onward)     Start     Ordered   09/17/21 0000  Diet - low sodium heart healthy        09/17/21 1609  DISCHARGE MEDICATION: Allergies as of 09/17/2021   No Known Allergies      Medication List     STOP taking these medications    aspirin EC 81 MG tablet Replaced by: aspirin 325 MG tablet   topiramate 25 MG tablet Commonly known as: Topamax       TAKE these medications    amLODipine 10 MG tablet Commonly known as: NORVASC Take 1 tablet by mouth once daily What changed: when  to take this   aspirin 325 MG tablet Take 1 tablet (325 mg total) by mouth daily. Start taking on: Sep 18, 2021 Replaces: aspirin EC 81 MG tablet   atorvastatin 80 MG tablet Commonly known as: Lipitor Take 1 tablet (80 mg total) by mouth daily.   Blood Pressure Monitor Automat Devi 1 Device by Does not apply route daily.   cilostazol 50 MG tablet Commonly known as: PLETAL Take 1 tablet (50 mg total) by mouth 2 (two) times daily. What changed: when to take this   clopidogrel 75 MG tablet Commonly known as: PLAVIX Take 1 tablet (75 mg total) by mouth daily. Start taking on: Sep 18, 2021   ezetimibe 10 MG tablet Commonly known as: ZETIA Take 1 tablet (10 mg total) by mouth daily. Start taking on: Sep 18, 2021   FLUoxetine 20 MG capsule Commonly known as: PROZAC Take 1 capsule by mouth once daily What changed: when to take this   FreeStyle Libre 2 Sensor Misc 1 Device by Does not apply route every 14 (fourteen) days.   glucose blood test strip Commonly known as: OneTouch Verio USE TO CHECK BLOOD SUGAR TWICE A DAY AND PRN   glucose blood test strip Commonly known as: Contour Next Test 1 each by Other route 2 (two) times daily. And lancets 2/day   hydrALAZINE 50 MG tablet Commonly known as: APRESOLINE Take 1 tablet (50 mg total) by mouth 3 (three) times daily. What changed: when to take this   insulin glargine 100 UNIT/ML Solostar Pen Commonly known as: LANTUS Inject 16 Units into the skin daily. And pen needles 1/day What changed:  how much to take when to take this   lisinopril-hydrochlorothiazide 20-12.5 MG tablet Commonly known as: ZESTORETIC Take 2 tablets by mouth once daily What changed: when to take this   metoprolol succinate 50 MG 24 hr tablet Commonly known as: TOPROL-XL TAKE 1 TABLET BY MOUTH ONCE DAILY WITH MEALS OR  IMMEDIATELY  FOLLOWING  A  MEAL What changed: See the new instructions.   ONE TOUCH LANCETS Misc USE TO CHECK BLOOD SUGAR TWICE  A DAY AND PRN   pantoprazole 40 MG tablet Commonly known as: PROTONIX Take 1 tablet (40 mg total) by mouth daily. What changed: when to take this   Vitamin B Complex Tabs Take 1 tablet by mouth every morning.   VITAMIN D3 PO Take 1 capsule by mouth every morning.               Durable Medical Equipment  (From admission, onward)           Start     Ordered   09/16/21 1514  For home use only DME Other see comment  Once       Comments: Shower chair with back.  Question:  Length of Need  Answer:  Lifetime   09/16/21 1514   09/16/21 1513  For home use only DME Walker rolling  Once       Question Answer Comment  Walker:  With 5 Inch Wheels   Patient needs a walker to treat with the following condition Weakness      09/16/21 [place] Follow up.   Specialty: Rehabilitation Why: Call to schedule apt for physical and occupational therapy. Contact information: [address] [place]        Mata, Lisa G, MD. Schedule an appointment as soon as possible for a visit in 1 week(s).   Specialty: Family Medicine Contact information: [place] [address] [phone]         [name], MD .   Specialty: Endocrinology Contact information: [place] [place] [address] [phone]         Alda Berthold, DO. Schedule an appointment as soon as possible for a visit in 1 month(s).   Specialty: Neurology Contact information: [place] [address] [phone]                 Discharge Instructions     Ambulatory referral to Neurology   Complete by: As directed    Follow up with Dr. Roxan Hockey in 4-6 weeks. PT is Dr. Serita Grit pt. Thanks.   Ambulatory referral to Occupational Therapy   Complete by: As directed     Ambulatory referral to Physical Therapy   Complete by: As directed    Diet - low sodium heart healthy   Complete by: As directed    Discharge instructions   Complete by: As directed    From Dr. Loleta Books: You were admitted with another stroke To prevent future strokes, a few things are important: Your diet needs to be better. Less sugar, less processed foods, less salt, less meat Your blood pressure needs to be controlled You need to take aspirin, Plavix and cholesterol medicine You need to follow up with [name] at the Neurologists office.   First: Continue aspirin but increase to 325 mg daily from now on Add clopidogrel/Plavix 75 mg daily Take aspirin and Plavix both, discuss with [name] at the Neurologists office when it is appropriate to stop that or if you should do it forever  Restart taking your cholesterol medicine atorvastatin Also take the new cholesterol medicine ezetimibe/Zetia  Finally, control your blood pressure: Resume your blood pressure medicines amlodipine, lisinopril-HCTZ, metoprolol, and hydralazine over the next 3 days We started amlodipine here today Continue amlodipine 10 mg once daily  Tomorrow (Saturday) add back the lisinopril-HCTZ 20-12.5 (take 2 tablets in the morning every morning) Sunday add back the hydralazine 25 mg --> BUT IMPORTANT --> TAKE THIS THRE TIMES PER DAY, not just once per day Monday add back the metoprolol --> Take metoprolol 50 mg once daily   Call Dr. [name] for a follow up appointment, bring all your medicines to her office   Lastly, for your blood sugar, we notice you are taking too much insulin at home: Reduce your Lantus to 16 units once daily (Do not take 80 units twice daily) Make sure there is less sugar and certainly no juices or sodas in your diet  Take Lantus 16 units once daily and go see Dr. Loanne Drilling as soon as you can get an appointment to discuss this change with him  Last important detail: Take  cilostazol/Pletal TWICE daily   Increase activity slowly   Complete by:  As directed    No wound care   Complete by: As directed        Discharge Exam: Filed Weights   09/16/21 0328  Weight: 90.4 kg    General: Pt is alert, awake, not in acute distress Cardiovascular: RRR, nl S1-S2, no murmurs appreciated.   No LE edema.   Respiratory: Normal respiratory rate and rhythm.  CTAB without rales or wheezes. Abdominal: Abdomen soft and non-tender.  No distension or HSM.   Neuro/Psych: Strength symmetric in upper and lower extremities.  Judgment and insight appear normal, ataxic gait, unsteady, persistent diplopia.   Condition at discharge: good  The results of significant diagnostics from this hospitalization (including imaging, microbiology, ancillary and laboratory) are listed below for reference.   Imaging Studies: CT ANGIO HEAD NECK W WO CM  Addendum Date: 09/16/2021   ADDENDUM REPORT: 09/16/2021 00:01 ADDENDUM: The patient has a history of hematologic malignancy, which is compatible with the lymphadenopathy described in the original report. Electronically Signed   By: Ulyses Jarred M.D.   On: 09/16/2021 00:01   Result Date: 09/16/2021 CLINICAL DATA:  Dizziness EXAM: CT ANGIOGRAPHY HEAD AND NECK TECHNIQUE: Multidetector CT imaging of the head and neck was performed using the standard protocol during bolus administration of intravenous contrast. Multiplanar CT image reconstructions and MIPs were obtained to evaluate the vascular anatomy. Carotid stenosis measurements (when applicable) are obtained utilizing NASCET criteria, using the distal internal carotid diameter as the denominator. RADIATION DOSE REDUCTION: This exam was performed according to the departmental dose-optimization program which includes automated exposure control, adjustment of the mA and/or kV according to patient size and/or use of iterative reconstruction technique. CONTRAST:  70m OMNIPAQUE IOHEXOL 350 MG/ML SOLN  COMPARISON:  MRA head 02/07/2021 FINDINGS: CTA NECK FINDINGS SKELETON: There is no bony spinal canal stenosis. No lytic or blastic lesion. OTHER NECK: Heterogeneous thyroid gland with 1.6 cm right thyroid nodule. There are multiple enlarged bilateral cervical lymph nodes. At level left 2A is a node measuring 13 mm. At level right 1B is a 15 mm node. UPPER CHEST: No pneumothorax or pleural effusion. No nodules or masses. AORTIC ARCH: There is calcific atherosclerosis of the aortic arch. There is no aneurysm, dissection or hemodynamically significant stenosis of the visualized portion of the aorta. Rights of the visualized proximal subclavian arteries are widely patent. RIGHT CAROTID SYSTEM: No dissection, occlusion or aneurysm. Mild atherosclerotic calcification at the carotid bifurcation without hemodynamically significant stenosis. LEFT CAROTID SYSTEM: No dissection, occlusion or aneurysm. Mild atherosclerotic calcification at the carotid bifurcation without hemodynamically significant stenosis. VERTEBRAL ARTERIES: Left dominant configuration. Both origins are clearly patent. There is no dissection, occlusion or flow-limiting stenosis to the skull base (V1-V3 segments). CTA HEAD FINDINGS POSTERIOR CIRCULATION: --Vertebral arteries: Both distal V4 segments are occluded. This appears to have progressed since the MRA of 02/07/2021. --Inferior cerebellar arteries: Normal. --Basilar artery: Patent but diminutive. --Superior cerebellar arteries: Normal. --Posterior cerebral arteries (PCA): Normal. ANTERIOR CIRCULATION: --Intracranial internal carotid arteries: Atherosclerotic calcification of the internal carotid arteries at the skull base without hemodynamically significant stenosis. --Anterior cerebral arteries (ACA): Normal. Both A1 segments are present. Patent anterior communicating artery (a-comm). --Middle cerebral arteries (MCA): Normal. VENOUS SINUSES: As permitted by contrast timing, patent. ANATOMIC VARIANTS:  None Review of the MIP images confirms the above findings. IMPRESSION: 1. Bilateral distal V4 segment occlusion, which appears to have progressed since the MRA of 02/07/2021. 2. 1.6 cm incidental right thyroid nodule with heterogeneous and enlarged thyroid. Recommend thyroid [place] Reference: J  Am Coll Radiol. 2015 Feb;12(2): 143-50 3. Multiple enlarged bilateral cervical lymph nodes, of uncertain etiology but concerning for lymphoproliferative disorder. Aortic Atherosclerosis (ICD10-I70.0). Electronically Signed: By: Ulyses Jarred M.D. On: 09/15/2021 23:14   CT Head Wo Contrast  Result Date: 09/15/2021 CLINICAL DATA:  Dizziness. EXAM: CT HEAD WITHOUT CONTRAST TECHNIQUE: Contiguous axial images were obtained from the base of the skull through the vertex without intravenous contrast. RADIATION DOSE REDUCTION: This exam was performed according to the departmental dose-optimization program which includes automated exposure control, adjustment of the mA and/or kV according to patient size and/or use of iterative reconstruction technique. COMPARISON:  Head CT dated 04/09/2020. FINDINGS: Brain: The ventricles and sulci are appropriate size for the patient's age. The gray-white matter discrimination is preserved. There is no acute intracranial hemorrhage. No mass effect or midline shift. No extra-axial fluid collection. Vascular: No hyperdense vessel or unexpected calcification. Skull: Normal. Negative for fracture or focal lesion. Sinuses/Orbits: No acute finding. Other: None IMPRESSION: No acute intracranial pathology. Electronically Signed   By: Anner Crete M.D.   On: 09/15/2021 17:17   MR BRAIN WO CONTRAST  Result Date: 09/15/2021 CLINICAL DATA:  Dizziness EXAM: MRI HEAD WITHOUT CONTRAST TECHNIQUE: Multiplanar, multiecho pulse sequences of the brain and surrounding structures were obtained without intravenous contrast. COMPARISON:  None Available. FINDINGS: Brain: There are small foci of abnormal diffusion  restriction within both cerebellar hemispheres. Chronic microhemorrhage in the right thalamus. There is multifocal hyperintense T2-weighted signal within the white matter. Parenchymal volume and CSF spaces are normal. The midline structures are normal. Vascular: Major flow voids are preserved. Skull and upper cervical spine: Normal calvarium and skull base. Visualized upper cervical spine and soft tissues are normal. Sinuses/Orbits:No paranasal sinus fluid levels or advanced mucosal thickening. No mastoid or middle ear effusion. Normal orbits. IMPRESSION: 1. Small foci of acute ischemia within both cerebellar hemispheres. No hemorrhage or mass effect. 2. Findings of chronic small vessel ischemia. Electronically Signed   By: Ulyses Jarred M.D.   On: 09/15/2021 21:50   ECHOCARDIOGRAM COMPLETE  Result Date: 09/16/2021    ECHOCARDIOGRAM REPORT   Patient Name:   [name] Date of Exam: 09/16/2021 Medical Rec #:  [id number]       Height:       64.0 in Accession #:    [id number]      Weight:       199.3 lb Date of Birth:  January 19, 1961       BSA:          1.953 m Patient Age:    5 years        BP:           137/73 mmHg Patient Gender: F               HR:           42 bpm. Exam Location:  Inpatient Procedure: 2D Echo, 3D Echo, Cardiac Doppler, Color Doppler and Intracardiac            Opacification Agent Indications:    Stroke  History:        Patient has prior history of Echocardiogram examinations, most                 recent 02/08/2021. Stroke; Risk Factors:Dyslipidemia, Diabetes                 and Hypertension.  Sonographer:    Roseanna Rainbow RDCS Referring Phys: [id number] [place]  Sonographer Comments: Patient  is morbidly obese, Technically difficult study due to poor echo windows and suboptimal apical window. Image acquisition challenging due to patient body habitus. IMPRESSIONS  1. Left ventricular ejection fraction, by estimation, is 60 to 65%. The left ventricle has normal function. The left ventricle  has no regional wall motion abnormalities. Left ventricular diastolic parameters are consistent with Grade II diastolic dysfunction (pseudonormalization). Elevated left ventricular end-diastolic pressure.  2. Right ventricular systolic function is normal. The right ventricular size is normal.  3. The mitral valve is abnormal. Mild mitral valve regurgitation. No evidence of mitral stenosis.  4. The aortic valve is normal in structure. Aortic valve regurgitation is not visualized. No aortic stenosis is present.  5. The inferior vena cava is normal in size with greater than 50% respiratory variability, suggesting right atrial pressure of 3 mmHg. FINDINGS  Left Ventricle: Left ventricular ejection fraction, by estimation, is 60 to 65%. The left ventricle has normal function. The left ventricle has no regional wall motion abnormalities. Definity contrast agent was given IV to delineate the left ventricular  endocardial borders. The left ventricular internal cavity size was normal in size. There is no left ventricular hypertrophy. Left ventricular diastolic parameters are consistent with Grade II diastolic dysfunction (pseudonormalization). Elevated left ventricular end-diastolic pressure. Right Ventricle: The right ventricular size is normal. No increase in right ventricular wall thickness. Right ventricular systolic function is normal. Left Atrium: Left atrial size was normal in size. Right Atrium: Right atrial size was normal in size. Pericardium: There is no evidence of pericardial effusion. Mitral Valve: The mitral valve is abnormal. There is mild thickening of the mitral valve leaflet(s). There is mild calcification of the mitral valve leaflet(s). Mild mitral valve regurgitation. No evidence of mitral valve stenosis. MV peak gradient, 6.1 mmHg. The mean mitral valve gradient is 2.0 mmHg. Tricuspid Valve: The tricuspid valve is normal in structure. Tricuspid valve regurgitation is not demonstrated. No evidence of  tricuspid stenosis. Aortic Valve: The aortic valve is normal in structure. Aortic valve regurgitation is not visualized. No aortic stenosis is present. Pulmonic Valve: The pulmonic valve was normal in structure. Pulmonic valve regurgitation is not visualized. No evidence of pulmonic stenosis. Aorta: The aortic root is normal in size and structure. Venous: The inferior vena cava is normal in size with greater than 50% respiratory variability, suggesting right atrial pressure of 3 mmHg. IAS/Shunts: No atrial level shunt detected by color flow Doppler.  LEFT VENTRICLE PLAX 2D LVIDd:         4.30 cm      Diastology LVIDs:         2.90 cm      LV e' medial:    4.16 cm/s LV PW:         1.40 cm      LV E/e' medial:  27.9 LV IVS:        1.00 cm      LV e' lateral:   6.01 cm/s LVOT diam:     2.30 cm      LV E/e' lateral: 19.3 LV SV:         114 LV SV Index:   58 LVOT Area:     4.15 cm                              3D Volume EF: LV Volumes (MOD)            3D EF:  64 % LV vol d, MOD A2C: 122.0 ml LV EDV:       242 ml LV vol d, MOD A4C: 170.0 ml LV ESV:       88 ml LV vol s, MOD A2C: 47.4 ml  LV SV:        155 ml LV vol s, MOD A4C: 59.9 ml LV SV MOD A2C:     74.6 ml LV SV MOD A4C:     170.0 ml LV SV MOD BP:      95.1 ml RIGHT VENTRICLE            IVC RV S prime:     9.76 cm/s  IVC diam: 2.10 cm TAPSE (M-mode): 2.0 cm LEFT ATRIUM             Index        RIGHT ATRIUM           Index LA diam:        3.40 cm 1.74 cm/m   RA Area:     10.70 cm LA Vol (A2C):   63.7 ml 32.61 ml/m  RA Volume:   20.00 ml  10.24 ml/m LA Vol (A4C):   72.4 ml 37.06 ml/m LA Biplane Vol: 68.3 ml 34.96 ml/m  AORTIC VALVE LVOT Vmax:   83.90 cm/s LVOT Vmean:  57.000 cm/s LVOT VTI:    0.274 m  AORTA Ao Root diam: 3.30 cm Ao Asc diam:  3.45 cm MITRAL VALVE MV Area (PHT): 2.69 cm     SHUNTS MV Area VTI:   2.32 cm     Systemic VTI:  0.27 m MV Peak grad:  6.1 mmHg     Systemic Diam: 2.30 cm MV Mean grad:  2.0 mmHg MV Vmax:       1.23 m/s MV Vmean:       53.2 cm/s MV Decel Time: 282 msec MV E velocity: 116.00 cm/s MV A velocity: 79.60 cm/s MV E/A ratio:  1.46 Jenkins Rouge MD Electronically signed by Jenkins Rouge MD Signature Date/Time: 09/16/2021/9:08:45 AM    Final     Microbiology: Results for orders placed or performed during the hospital encounter of 02/06/21  Resp Panel by RT-PCR (Flu A&B, Covid) Nasopharyngeal Swab     Status: None   Collection Time: 02/07/21 12:59 AM   Specimen: Nasopharyngeal Swab; Nasopharyngeal(NP) swabs in vial transport medium  Result Value Ref Range Status   SARS Coronavirus 2 by RT PCR NEGATIVE NEGATIVE Final    Comment: (NOTE) SARS-CoV-2 target nucleic acids are NOT DETECTED.  The SARS-CoV-2 RNA is generally detectable in upper respiratory specimens during the acute phase of infection. The lowest concentration of SARS-CoV-2 viral copies this assay can detect is 138 copies/mL. A negative result does not preclude SARS-Cov-2 infection and should not be used as the sole basis for treatment or other patient management decisions. A negative result may occur with  improper specimen collection/handling, submission of specimen other than nasopharyngeal swab, presence of viral mutation(s) within the areas targeted by this assay, and inadequate number of viral copies(<138 copies/mL). A negative result must be combined with clinical observations, patient history, and epidemiological information. The expected result is Negative.  Fact Sheet for Patients:  EntrepreneurPulse.com.au  Fact Sheet for Healthcare Providers:  IncredibleEmployment.be  This test is no t yet approved or cleared by the [place] FDA and  has been authorized for detection and/or diagnosis of SARS-CoV-2 by FDA under an Emergency Use Authorization (EUA). This EUA will remain  in effect (meaning this test can be used) for the duration of the COVID-19 declaration under Section 564(b)(1) of the Act,  21 U.S.C.section 360bbb-3(b)(1), unless the authorization is terminated  or revoked sooner.       Influenza A by PCR NEGATIVE NEGATIVE Final   Influenza B by PCR NEGATIVE NEGATIVE Final    Comment: (NOTE) The Xpert Xpress SARS-CoV-2/FLU/RSV plus assay is intended as an aid in the diagnosis of influenza from Nasopharyngeal swab specimens and should not be used as a sole basis for treatment. Nasal washings and aspirates are unacceptable for Xpert Xpress SARS-CoV-2/FLU/RSV testing.  Fact Sheet for Patients: EntrepreneurPulse.com.au  Fact Sheet for Healthcare Providers: IncredibleEmployment.be  This test is not yet approved or cleared by the [place] FDA and has been authorized for detection and/or diagnosis of SARS-CoV-2 by FDA under an Emergency Use Authorization (EUA). This EUA will remain in effect (meaning this test can be used) for the duration of the COVID-19 declaration under Section 564(b)(1) of the Act, 21 U.S.C. section 360bbb-3(b)(1), unless the authorization is terminated or revoked.  Performed at [facility], [address] [place], [place] [address]     Labs: CBC: Recent Labs  Lab 09/15/21 1802  WBC 7.9  NEUTROABS 4.2  HGB 11.9*  HCT 36.8  MCV 78.5*  PLT 993   Basic Metabolic Panel: Recent Labs  Lab 09/15/21 1802 09/17/21 0340  NA 138 137  K 4.0 3.9  CL 103 106  CO2 25 24  GLUCOSE 112* 195*  BUN 27* 30*  CREATININE 1.13* 1.29*  CALCIUM 10.0 8.9   Liver Function Tests: No results for input(s): AST, ALT, ALKPHOS, BILITOT, PROT, ALBUMIN in the last 168 hours. CBG: Recent Labs  Lab 09/16/21 2347 09/17/21 0344 09/17/21 0506 09/17/21 0755 09/17/21 1119  GLUCAP 195* 195* 187* 234* 153*    Discharge time spent: approximately 40 minutes spent on discharge counseling, evaluation of patient on day of discharge, and coordination of discharge planning with nursing, social work, pharmacy and  case management  Signed: Edwin Dada, MD [facility] 09/17/2021

## 2021-09-17 NOTE — Progress Notes (Addendum)
Inpatient Diabetes Program Recommendations  AACE/ADA: New Consensus Statement on Inpatient Glycemic Control (2015)  Target Ranges:  Prepandial:   less than 140 mg/dL      Peak postprandial:   less than 180 mg/dL (1-2 hours)      Critically ill patients:  140 - 180 mg/dL   Lab Results  Component Value Date   GLUCAP 153 (H) 09/17/2021   HGBA1C 12.9 (H) 09/16/2021    Review of Glycemic Control  Diabetes history: type 2 Outpatient Diabetes medications: Lantus 160 units daily Current orders for Inpatient glycemic control: Novolog 0-15 units correction scale every 4 hours while NPO.   Inpatient Diabetes Program Recommendations:   Spoke with patient at the bedside. Had just gotten back from having cerebral angiogram. Patient was able to tell me that she was diagnosed with diabetes in 2010. She sees Dr. Loanne Drilling as her endocrinologist. He has prescribed Lantus 160 units daily and she states that she has been having low blood sugars at home. She states that she takes it everyday.   Reviewed HbgA1C of 12.9%. States that she does drink juices at home. Suggested that she eat the fruit instead of the juice.   Noted that patient has only been on Novolog correction scale in the hospital. May want to consider weight-based dose of Lantus for home. (90.4 kg X 0.2 units/kg = 18 units.)  Harvel Ricks RN BSN CDE Diabetes Coordinator Pager: [phone]  8am-5pm

## 2021-09-17 NOTE — Progress Notes (Addendum)
STROKE TEAM PROGRESS NOTE   SUBJECTIVE (INTERVAL HISTORY) Her sister is at the bedside. Pt lying in bed, post cerebral angiogram which showed b/l V4 occlusion with BA and PCAs supplied from prominent left PCOM. Recommend medical management.    OBJECTIVE Temp:  [97.5 F (36.4 C)-98.7 F (37.1 C)] 97.7 F (36.5 C) (05/19 1351) Pulse Rate:  [47-64] 64 (05/19 1447) Cardiac Rhythm: Sinus bradycardia (05/19 1010) Resp:  [14-20] 14 (05/19 1056) BP: (136-200)/(65-91) 161/91 (05/19 1447) SpO2:  [97 %-100 %] 100 % (05/19 1447)  Recent Labs  Lab 09/16/21 2347 09/17/21 0344 09/17/21 0506 09/17/21 0755 09/17/21 1119  GLUCAP 195* 195* 187* 234* 153*   Recent Labs  Lab 09/15/21 1802 09/17/21 0340  NA 138 137  K 4.0 3.9  CL 103 106  CO2 25 24  GLUCOSE 112* 195*  BUN 27* 30*  CREATININE 1.13* 1.29*  CALCIUM 10.0 8.9   No results for input(s): AST, ALT, ALKPHOS, BILITOT, PROT, ALBUMIN in the last 168 hours. Recent Labs  Lab 09/15/21 1802  WBC 7.9  NEUTROABS 4.2  HGB 11.9*  HCT 36.8  MCV 78.5*  PLT 292   No results for input(s): CKTOTAL, CKMB, CKMBINDEX, TROPONINI in the last 168 hours. No results for input(s): LABPROT, INR in the last 72 hours. No results for input(s): COLORURINE, LABSPEC, [place], GLUCOSEU, HGBUR, BILIRUBINUR, KETONESUR, PROTEINUR, UROBILINOGEN, NITRITE, LEUKOCYTESUR in the last 72 hours.  Invalid input(s): APPERANCEUR     Component Value Date/Time   CHOL 308 (H) 09/16/2021 0248   TRIG 138 09/16/2021 0248   TRIG 72 04/28/2006 0951   HDL 40 (L) 09/16/2021 0248   CHOLHDL 7.7 09/16/2021 0248   VLDL 28 09/16/2021 0248   LDLCALC 240 (H) 09/16/2021 0248   Lab Results  Component Value Date   HGBA1C 12.9 (H) 09/16/2021      Component Value Date/Time   LABOPIA NONE DETECTED 09/16/2021 1706   COCAINSCRNUR NONE DETECTED 09/16/2021 1706   LABBENZ NONE DETECTED 09/16/2021 1706   AMPHETMU NONE DETECTED 09/16/2021 1706   THCU NONE DETECTED 09/16/2021 1706    LABBARB NONE DETECTED 09/16/2021 1706    No results for input(s): ETH in the last 168 hours.  I have personally reviewed the radiological images below and agree with the radiology interpretations.  CT ANGIO HEAD NECK W WO CM  Addendum Date: 09/16/2021   ADDENDUM REPORT: 09/16/2021 00:01 ADDENDUM: The patient has a history of hematologic malignancy, which is compatible with the lymphadenopathy described in the original report. Electronically Signed   By: Ulyses Jarred M.D.   On: 09/16/2021 00:01   Result Date: 09/16/2021 CLINICAL DATA:  Dizziness EXAM: CT ANGIOGRAPHY HEAD AND NECK TECHNIQUE: Multidetector CT imaging of the head and neck was performed using the standard protocol during bolus administration of intravenous contrast. Multiplanar CT image reconstructions and MIPs were obtained to evaluate the vascular anatomy. Carotid stenosis measurements (when applicable) are obtained utilizing NASCET criteria, using the distal internal carotid diameter as the denominator. RADIATION DOSE REDUCTION: This exam was performed according to the departmental dose-optimization program which includes automated exposure control, adjustment of the mA and/or kV according to patient size and/or use of iterative reconstruction technique. CONTRAST:  29m OMNIPAQUE IOHEXOL 350 MG/ML SOLN COMPARISON:  MRA head 02/07/2021 FINDINGS: CTA NECK FINDINGS SKELETON: There is no bony spinal canal stenosis. No lytic or blastic lesion. OTHER NECK: Heterogeneous thyroid gland with 1.6 cm right thyroid nodule. There are multiple enlarged bilateral cervical lymph nodes. At level left 2A is a  node measuring 13 mm. At level right 1B is a 15 mm node. UPPER CHEST: No pneumothorax or pleural effusion. No nodules or masses. AORTIC ARCH: There is calcific atherosclerosis of the aortic arch. There is no aneurysm, dissection or hemodynamically significant stenosis of the visualized portion of the aorta. Rights of the visualized proximal  subclavian arteries are widely patent. RIGHT CAROTID SYSTEM: No dissection, occlusion or aneurysm. Mild atherosclerotic calcification at the carotid bifurcation without hemodynamically significant stenosis. LEFT CAROTID SYSTEM: No dissection, occlusion or aneurysm. Mild atherosclerotic calcification at the carotid bifurcation without hemodynamically significant stenosis. VERTEBRAL ARTERIES: Left dominant configuration. Both origins are clearly patent. There is no dissection, occlusion or flow-limiting stenosis to the skull base (V1-V3 segments). CTA HEAD FINDINGS POSTERIOR CIRCULATION: --Vertebral arteries: Both distal V4 segments are occluded. This appears to have progressed since the MRA of 02/07/2021. --Inferior cerebellar arteries: Normal. --Basilar artery: Patent but diminutive. --Superior cerebellar arteries: Normal. --Posterior cerebral arteries (PCA): Normal. ANTERIOR CIRCULATION: --Intracranial internal carotid arteries: Atherosclerotic calcification of the internal carotid arteries at the skull base without hemodynamically significant stenosis. --Anterior cerebral arteries (ACA): Normal. Both A1 segments are present. Patent anterior communicating artery (a-comm). --Middle cerebral arteries (MCA): Normal. VENOUS SINUSES: As permitted by contrast timing, patent. ANATOMIC VARIANTS: None Review of the MIP images confirms the above findings. IMPRESSION: 1. Bilateral distal V4 segment occlusion, which appears to have progressed since the MRA of 02/07/2021. 2. 1.6 cm incidental right thyroid nodule with heterogeneous and enlarged thyroid. Recommend thyroid US. Reference: J Am Coll Radiol. 2015 Feb;12(2): 143-50 3. Multiple enlarged bilateral cervical lymph nodes, of uncertain etiology but concerning for lymphoproliferative disorder. Aortic Atherosclerosis (ICD10-I70.0). Electronically Signed: By: Ulyses Jarred M.D. On: 09/15/2021 23:14   CT Head Wo Contrast  Result Date: 09/15/2021 CLINICAL DATA:  Dizziness.  EXAM: CT HEAD WITHOUT CONTRAST TECHNIQUE: Contiguous axial images were obtained from the base of the skull through the vertex without intravenous contrast. RADIATION DOSE REDUCTION: This exam was performed according to the departmental dose-optimization program which includes automated exposure control, adjustment of the mA and/or kV according to patient size and/or use of iterative reconstruction technique. COMPARISON:  Head CT dated 04/09/2020. FINDINGS: Brain: The ventricles and sulci are appropriate size for the patient's age. The gray-white matter discrimination is preserved. There is no acute intracranial hemorrhage. No mass effect or midline shift. No extra-axial fluid collection. Vascular: No hyperdense vessel or unexpected calcification. Skull: Normal. Negative for fracture or focal lesion. Sinuses/Orbits: No acute finding. Other: None IMPRESSION: No acute intracranial pathology. Electronically Signed   By: Anner Crete M.D.   On: 09/15/2021 17:17   MR BRAIN WO CONTRAST  Result Date: 09/15/2021 CLINICAL DATA:  Dizziness EXAM: MRI HEAD WITHOUT CONTRAST TECHNIQUE: Multiplanar, multiecho pulse sequences of the brain and surrounding structures were obtained without intravenous contrast. COMPARISON:  None Available. FINDINGS: Brain: There are small foci of abnormal diffusion restriction within both cerebellar hemispheres. Chronic microhemorrhage in the right thalamus. There is multifocal hyperintense T2-weighted signal within the white matter. Parenchymal volume and CSF spaces are normal. The midline structures are normal. Vascular: Major flow voids are preserved. Skull and upper cervical spine: Normal calvarium and skull base. Visualized upper cervical spine and soft tissues are normal. Sinuses/Orbits:No paranasal sinus fluid levels or advanced mucosal thickening. No mastoid or middle ear effusion. Normal orbits. IMPRESSION: 1. Small foci of acute ischemia within both cerebellar hemispheres. No  hemorrhage or mass effect. 2. Findings of chronic small vessel ischemia. Electronically Signed   By: Lennette Bihari  [place] M.D.   On: 09/15/2021 21:50   ECHOCARDIOGRAM COMPLETE  Result Date: 09/16/2021    ECHOCARDIOGRAM REPORT   Patient Name:   CENIYAH THORP Date of Exam: 09/16/2021 Medical Rec #:  [id number]       Height:       64.0 in Accession #:    [id number]      Weight:       199.3 lb Date of Birth:  [birth year]       BSA:          1.953 m Patient Age:    75 years        BP:           137/73 mmHg Patient Gender: F               HR:           42 bpm. Exam Location:  Inpatient Procedure: 2D Echo, 3D Echo, Cardiac Doppler, Color Doppler and Intracardiac            Opacification Agent Indications:    Stroke  History:        Patient has prior history of Echocardiogram examinations, most                 recent 02/08/2021. Stroke; Risk Factors:Dyslipidemia, Diabetes                 and Hypertension.  Sonographer:    Roseanna Rainbow RDCS Referring Phys: [id number] Candace Gallus [facility]  Sonographer Comments: Patient is morbidly obese, Technically difficult study due to poor echo windows and suboptimal apical window. Image acquisition challenging due to patient body habitus. IMPRESSIONS  1. Left ventricular ejection fraction, by estimation, is 60 to 65%. The left ventricle has normal function. The left ventricle has no regional wall motion abnormalities. Left ventricular diastolic parameters are consistent with Grade II diastolic dysfunction (pseudonormalization). Elevated left ventricular end-diastolic pressure.  2. Right ventricular systolic function is normal. The right ventricular size is normal.  3. The mitral valve is abnormal. Mild mitral valve regurgitation. No evidence of mitral stenosis.  4. The aortic valve is normal in structure. Aortic valve regurgitation is not visualized. No aortic stenosis is present.  5. The inferior vena cava is normal in size with greater than 50% respiratory variability, suggesting right atrial  pressure of 3 mmHg. FINDINGS  Left Ventricle: Left ventricular ejection fraction, by estimation, is 60 to 65%. The left ventricle has normal function. The left ventricle has no regional wall motion abnormalities. Definity contrast agent was given IV to delineate the left ventricular  endocardial borders. The left ventricular internal cavity size was normal in size. There is no left ventricular hypertrophy. Left ventricular diastolic parameters are consistent with Grade II diastolic dysfunction (pseudonormalization). Elevated left ventricular end-diastolic pressure. Right Ventricle: The right ventricular size is normal. No increase in right ventricular wall thickness. Right ventricular systolic function is normal. Left Atrium: Left atrial size was normal in size. Right Atrium: Right atrial size was normal in size. Pericardium: There is no evidence of pericardial effusion. Mitral Valve: The mitral valve is abnormal. There is mild thickening of the mitral valve leaflet(s). There is mild calcification of the mitral valve leaflet(s). Mild mitral valve regurgitation. No evidence of mitral valve stenosis. MV peak gradient, 6.1 mmHg. The mean mitral valve gradient is 2.0 mmHg. Tricuspid Valve: The tricuspid valve is normal in structure. Tricuspid valve regurgitation is not demonstrated. No evidence of tricuspid stenosis. Aortic Valve: The aortic valve is normal  in structure. Aortic valve regurgitation is not visualized. No aortic stenosis is present. Pulmonic Valve: The pulmonic valve was normal in structure. Pulmonic valve regurgitation is not visualized. No evidence of pulmonic stenosis. Aorta: The aortic root is normal in size and structure. Venous: The inferior vena cava is normal in size with greater than 50% respiratory variability, suggesting right atrial pressure of 3 mmHg. IAS/Shunts: No atrial level shunt detected by color flow Doppler.  LEFT VENTRICLE PLAX 2D LVIDd:         4.30 cm      Diastology LVIDs:          2.90 cm      LV e' medial:    4.16 cm/s LV PW:         1.40 cm      LV E/e' medial:  27.9 LV IVS:        1.00 cm      LV e' lateral:   6.01 cm/s LVOT diam:     2.30 cm      LV E/e' lateral: 19.3 LV SV:         114 LV SV Index:   58 LVOT Area:     4.15 cm                              3D Volume EF: LV Volumes (MOD)            3D EF:        64 % LV vol d, MOD A2C: 122.0 ml LV EDV:       242 ml LV vol d, MOD A4C: 170.0 ml LV ESV:       88 ml LV vol s, MOD A2C: 47.4 ml  LV SV:        155 ml LV vol s, MOD A4C: 59.9 ml LV SV MOD A2C:     74.6 ml LV SV MOD A4C:     170.0 ml LV SV MOD BP:      95.1 ml RIGHT VENTRICLE            IVC RV S prime:     9.76 cm/s  IVC diam: 2.10 cm TAPSE (M-mode): 2.0 cm LEFT ATRIUM             Index        RIGHT ATRIUM           Index LA diam:        3.40 cm 1.74 cm/m   RA Area:     10.70 cm LA Vol (A2C):   63.7 ml 32.61 ml/m  RA Volume:   20.00 ml  10.24 ml/m LA Vol (A4C):   72.4 ml 37.06 ml/m LA Biplane Vol: 68.3 ml 34.96 ml/m  AORTIC VALVE LVOT Vmax:   83.90 cm/s LVOT Vmean:  57.000 cm/s LVOT VTI:    0.274 m  AORTA Ao Root diam: 3.30 cm Ao Asc diam:  3.45 cm MITRAL VALVE MV Area (PHT): 2.69 cm     SHUNTS MV Area VTI:   2.32 cm     Systemic VTI:  0.27 m MV Peak grad:  6.1 mmHg     Systemic Diam: 2.30 cm MV Mean grad:  2.0 mmHg MV Vmax:       1.23 m/s MV Vmean:      53.2 cm/s MV Decel Time: 282 msec MV E velocity: 116.00 cm/s MV A velocity: 79.60 cm/s MV E/A  ratio:  1.46 Jenkins Rouge MD Electronically signed by Jenkins Rouge MD Signature Date/Time: 09/16/2021/9:08:45 AM    Final      PHYSICAL EXAM  Temp:  [97.5 F (36.4 C)-98.7 F (37.1 C)] 97.7 F (36.5 C) (05/19 1351) Pulse Rate:  [47-64] 64 (05/19 1447) Resp:  [14-20] 14 (05/19 1056) BP: (136-200)/(65-91) 161/91 (05/19 1447) SpO2:  [97 %-100 %] 100 % (05/19 1447)  General - Well nourished, well developed, in no apparent distress.  Ophthalmologic - fundi not visualized due to noncooperation.  Cardiovascular - Regular  rhythm and rate.  Mental Status -  Level of arousal and orientation to time, place, and person were intact. Language including expression, naming, repetition, comprehension was assessed and found intact. Attention span and concentration were normal. Fund of Knowledge was assessed and was intact.  Cranial Nerves II - XII - II - Visual field intact OU. III, IV, VI - Extraocular movements intact. V - Facial sensation intact bilaterally. VII - Facial movement intact bilaterally. VIII - Hearing & vestibular intact bilaterally. X - Palate elevates symmetrically. XI - Chin turning & shoulder shrug intact bilaterally. XII - Tongue protrusion intact.  Motor Strength - The patient's strength was normal in all extremities and pronator drift was absent.  Bulk was normal and fasciculations were absent.   Motor Tone - Muscle tone was assessed at the neck and appendages and was normal.  Reflexes - The patient's reflexes were symmetrical in all extremities and she had no pathological reflexes.  Sensory - Light touch, temperature/pinprick were assessed and were symmetrical.    Coordination - The patient had normal movements in the right hand and foot with no ataxia or dysmetria.  Slight left finger-nose and heel-to-shin dysmetria.  Tremor was absent.  Gait and Station - deferred.   ASSESSMENT/PLAN [name] is a 61 y.o. female with history of hypertension, hyperlipidemia, diabetes, lymphoma, neuropathy, strokes admitted for dizziness and diplopia. No tPA given due to outside window.    Stroke:  bilateral cerebellum punctate infarcts, likely secondary to large vessel disease source from bilateral VA occlusion CT no acute abnormality CTA head and neck bilateral V4 occlusion, basilar artery stenosis, bilateral siphon stenosis left more than right, right M1/M2 high-grade stenosis. MRI bilateral cerebellum punctate infarct 2D Echo EF 60 to 65% LDL 240 HgbA1c 12.9 Lovenox for VTE  prophylaxis Pletal  prior to admission, now on aspirin 325 mg daily and clopidogrel 75 mg daily. Continue DAPT for 3 months and then plavix alone.  Patient counseled to be compliant with her antithrombotic medications Ongoing aggressive stroke risk factor management Therapy recommendations: Home health PT/OT Disposition: home today  Multifocal intracranial stenosis CTA head and neck bilateral V4 occlusion, basilar artery stenosis, bilateral siphon stenosis left more than right, right M1/M2 high-grade stenosis. Chronic progression due to uncontrolled risk factors S/p angiogram showed b/l V4 occlusion, BA and b/l PCAs are supplied by prominent left PCOM Continue DAPT for 3 months then plavix alone  History of stroke Stroke in 07/2008 01/2021 admitted for bilateral lower extremity numbness, dizziness.  MRI showed left pontine infarct.  MRI showed right V4 occlusion, basilar artery small, multifocal severe intracranial stenosis including left PCA, right siphon, bilateral M1 and M2, right A1.  EF 55 to 60%.  LDL 169, A1c 10.7.  Discharged on DAPT and Lipitor 80.  Diabetes, uncontrolled HgbA1c 12.9 goal < 7.0 Glucose fluctuate, hyperglycemia with hypoglycemia CBG monitoring SSI DM education and close PCP follow up  Hypertension Stable on high end Avoid  low BP Long term BP goal normotensive  Hyperlipidemia Home meds: Lipitor 80, not compliant LDL 240, goal < 70 Now on Lipitor 80 and Zetia 10 Continue statin at discharge  Other Stroke Risk Factors Obesity, Body mass index is 34.21 kg/m.   Other Active Problems History of lymphoma Diabetic neuropathy  Hospital day # 0  Neurology will sign off. Please call with questions. Pt will follow up with Dr. Posey Pronto at [facility] in about 4 weeks. Thanks for the consult.   Rosalin Hawking, MD PhD Stroke Neurology 09/17/2021 4:44 PM    To contact Stroke Continuity provider, please refer to [email or web address] After hours, contact General Neurology

## 2021-09-17 NOTE — Procedures (Signed)
  Procedure:  3 vessel cerebral angiogram including Bil ICAs and Lt vertebral  Preprocedure diagnosis: Bil V4 occlusions Postprocedure diagnosis: Occlusion of Lt V4 with a prominent Lt P-comm opacifying the basilar and the bil PCAs EBL:    minimal Complications:   none immediate  See full dictation in [employer].  Frazier Richards, MD Main # [phone] Mobile [phone]

## 2021-09-17 NOTE — Progress Notes (Signed)
OT Cancellation Note  Patient Details Name: Janeli Lewison MRN: [id number] DOB: 1960-10-22   Cancelled Treatment:    Reason Eval/Treat Not Completed: Patient at procedure or test/ unavailable (Pt in IR.)  Malka So 09/17/2021, 10:06 AM Nestor Lewandowsky, OTR/L [place] Pager: [phone] Office: [phone]

## 2021-09-17 NOTE — Progress Notes (Signed)
Received call regarding patient with elevated BP post NIR procedure - most recent BPs 183/82 >> 195/80 >> 182/77. Per RN patient asymptomatic, c/o mild back pain likely related to laying flat from procedure. Patient with HTN on arrival (147/75 - 200/82). No current BP medications per MAR.  No intervention needed at this time from [facility] perspective, per neurology note yesterday "Permissive hypertension (OK if <220/120) for 24-48 hours post stroke and then gradually normalized within 5-7 days."  RN to continue to monitor, if further concerns contact NIR or neurology.  Candiss Norse, PA-C

## 2021-09-17 NOTE — Progress Notes (Addendum)
Patient is s/p cerebral angiogram per IR. Per IR consult to pharmacy patient has a standard risk to bleed post procedure and should have antithrombotic meds held until tomorrow (5/20) AM along with LMWH px.   Will adjust plavix, ASA '325mg'$ , and lovenox to start tomorrow per protocol.   Atiyana Welte A. Levada Dy, PharmD, BCPS, FNKF Clinical Pharmacist [place] Please utilize Amion for appropriate phone number to reach the unit pharmacist ([place])   Addendum: D/W Dr. Erlinda Hong with stroke service and IR PA Rodena Piety. Will resume ASA and plavix today instead.   Rashard Ryle A. Levada Dy, PharmD, BCPS, FNKF Clinical Pharmacist [place] Please utilize Amion for appropriate phone number to reach the unit pharmacist ([place])

## 2021-09-17 NOTE — Progress Notes (Signed)
Patient's BG is 444. Per Dr Loleta Books, ok to give 15 units now and recheck in one hour. Order completed.

## 2021-09-17 NOTE — Plan of Care (Signed)
Pt is alert oriented x 4. RA. Pt has been NPO since midnight. Pt is due to have a cerebral angiogram today. Pt has had bath/ CHG bath. Pt ambulated to bathroom. No distress noted.    Problem: Health Behavior/Discharge Planning: Goal: Ability to manage health-related needs will improve Outcome: Progressing   Problem: Clinical Measurements: Goal: Ability to maintain clinical measurements within normal limits will improve Outcome: Progressing Goal: Will remain free from infection Outcome: Progressing Goal: Diagnostic test results will improve Outcome: Progressing Goal: Respiratory complications will improve Outcome: Progressing Goal: Cardiovascular complication will be avoided Outcome: Progressing   Problem: Activity: Goal: Risk for activity intolerance will decrease Outcome: Progressing   Problem: Nutrition: Goal: Adequate nutrition will be maintained Outcome: Progressing   Problem: Coping: Goal: Level of anxiety will decrease Outcome: Progressing   Problem: Elimination: Goal: Will not experience complications related to bowel motility Outcome: Progressing Goal: Will not experience complications related to urinary retention Outcome: Progressing   Problem: Pain Managment: Goal: General experience of comfort will improve Outcome: Progressing   Problem: Safety: Goal: Ability to remain free from injury will improve Outcome: Progressing   Problem: Skin Integrity: Goal: Risk for impaired skin integrity will decrease Outcome: Progressing   Problem: Education: Goal: Knowledge of disease or condition will improve Outcome: Progressing Goal: Knowledge of secondary prevention will improve (SELECT ALL) Outcome: Progressing Goal: Knowledge of patient specific risk factors will improve (INDIVIDUALIZE FOR PATIENT) Outcome: Progressing Goal: Individualized Educational Video(s) Outcome: Progressing   Problem: Coping: Goal: Will verbalize positive feelings about self Outcome:  Progressing Goal: Will identify appropriate support needs Outcome: Progressing   Problem: Health Behavior/Discharge Planning: Goal: Ability to manage health-related needs will improve Outcome: Progressing   Problem: Self-Care: Goal: Ability to participate in self-care as condition permits will improve Outcome: Progressing Goal: Ability to communicate needs accurately will improve Outcome: Progressing   Problem: Nutrition: Goal: Risk of aspiration will decrease Outcome: Progressing   Problem: Ischemic Stroke/TIA Tissue Perfusion: Goal: Complications of ischemic stroke/TIA will be minimized Outcome: Progressing

## 2021-09-17 NOTE — Progress Notes (Signed)
Physical Therapy Treatment Patient Details Name: [name] MRN: [id number] DOB: 11/03/1960 Today's Date: 09/17/2021   History of Present Illness Pt is a 61 year old woman admitted on 09/15/21 with unstable gait and dizziness. MRI + B cerebellar infarcts. PMH: DM, HLD, HTN, small cell lymphocitic impairment, PAD, LE neuropathy, CVA, GBS, obesity.    PT Comments    Patient progressing with mobility and able to negotiate steps appropriate for home entry and will have assistance.  She denied dizziness today so focus of session on stairs for home and visual compensation which she reported needed due to blurred vision.  Issued occlusion glasses, though still not right as pt reports her recent CBG in 400's.  Patient will need follow up outpatient PT to progress mobility, safety, balance and visual accomodation.  Patient anticipated d/c home this pm.   Recommendations for follow up therapy are one component of a multi-disciplinary discharge planning process, led by the attending physician.  Recommendations may be updated based on patient status, additional functional criteria and insurance authorization.  Follow Up Recommendations  Outpatient PT     Assistance Recommended at Discharge Intermittent Supervision/Assistance  Patient can return home with the following A little help with walking and/or transfers;A little help with bathing/dressing/bathroom;Help with stairs or ramp for entrance   Equipment Recommendations  Rolling walker (2 wheels)    Recommendations for Other Services       Precautions / Restrictions Precautions Precautions: Fall     Mobility  Bed Mobility   Bed Mobility: Supine to Sit, Sit to Supine     Supine to sit: Supervision Sit to supine: Supervision   General bed mobility comments: denies dizziness, complains of blurry vision    Transfers Overall transfer level: Needs assistance Equipment used: None Transfers: Sit to/from Stand Sit to Stand: Min guard            General transfer comment: up with assist for IV and telemetry and for safety    Ambulation/Gait Ambulation/Gait assistance: Min guard Gait Distance (Feet): 250 Feet Assistive device: None Gait Pattern/deviations: Step-through pattern, Decreased stride length, Drifts right/left       General Gait Details: mild veering in hallway noted initially, improved with further distance, minguard for balance/safety, at times S only   Stairs Stairs: Yes Stairs assistance: Supervision Stair Management: Two rails, One rail Right, Step to pattern, Alternating pattern Number of Stairs: 4 General stair comments: 2 steps x 2, reports her steps do not have a rail, encouraged to have sister hold her hand up steps at entry   Wheelchair Mobility    Modified Rankin (Stroke Patients Only) Modified Rankin (Stroke Patients Only) Pre-Morbid Rankin Score: No significant disability Modified Rankin: Moderately severe disability     Balance Overall balance assessment: Needs assistance   Sitting balance-Leahy Scale: Good     Standing balance support: No upper extremity supported Standing balance-Leahy Scale: Good Standing balance comment: no UE support today ambulating to bathroom then in hallway                            Cognition Arousal/Alertness: Awake/alert Behavior During Therapy: WFL for tasks assessed/performed Overall Cognitive Status: History of cognitive impairments - at baseline                                 General Comments: some STM deficits and MD in to speak with family  as pt not taking meds correctly PTA        Exercises      General Comments General comments (skin integrity, edema, etc.): BP after ambulation 163/82; sister in law in the room and MD discussing meds and d/c instructions.  Issued glasses with L side taped for occlusion due to reports of blurry vision and shadows of letters beside each other.  Reports her sugar was in  400's also like affecting vision right now as still blurry with L side occluded all the way.  Issued handout about adjusting amount that is occluded to allow increased L eye use when able.      Pertinent Vitals/Pain Pain Assessment Pain Assessment: No/denies pain    Home Living                          Prior Function            PT Goals (current goals can now be found in the care plan section) Progress towards PT goals: Progressing toward goals    Frequency           PT Plan Discharge plan needs to be updated    Co-evaluation              AM-PAC PT "6 Clicks" Mobility   Outcome Measure  Help needed turning from your back to your side while in a flat bed without using bedrails?: None Help needed moving from lying on your back to sitting on the side of a flat bed without using bedrails?: None Help needed moving to and from a bed to a chair (including a wheelchair)?: A Little Help needed standing up from a chair using your arms (e.g., wheelchair or bedside chair)?: A Little Help needed to walk in hospital room?: A Little Help needed climbing 3-5 steps with a railing? : A Little 6 Click Score: 20    End of Session Equipment Utilized During Treatment: Gait belt Activity Tolerance: Patient tolerated treatment well Patient left: in bed;with call bell/phone within reach;with family/visitor present   PT Visit Diagnosis: Muscle weakness (generalized) (M62.81);Dizziness and giddiness (R42)     Time: 1626-1700 PT Time Calculation (min) (ACUTE ONLY): 34 min  Charges:  $Gait Training: 8-22 mins $Therapeutic Activity: 8-22 mins                     Magda Kiel, PT Acute Rehabilitation Services MVEHM:[phone] [phone] 09/17/2021    Reginia Naas 09/17/2021, 5:56 PM

## 2021-09-17 NOTE — Progress Notes (Signed)
Patient discharge, left the unit via wheelchair in stable condition.

## 2021-09-20 ENCOUNTER — Telehealth: Payer: Self-pay

## 2021-09-20 NOTE — Telephone Encounter (Signed)
Transition Care Management Follow-up Telephone Call Date of discharge and from where:[facility] [place] 09-17-21 Dx: acute CVA  How have you been since you were released from the hospital? Doing fine  Any questions or concerns? No  Items Reviewed: Did the pt receive and understand the discharge instructions provided? Yes  Medications obtained and verified? Yes  Other? No  Any new allergies since your discharge? No  Dietary orders reviewed? Yes Do you have support at home? Yes   Home Care and Equipment/Supplies: Were home health services ordered? no If so, what is the name of the agency? na  Has the agency set up a time to come to the patient's home? not applicable Were any new equipment or medical supplies ordered?  Yes: RW- shower chair  What is the name of the medical supply agency? Unknown  Were you able to get the supplies/equipment? no Do you have any questions related to the use of the equipment or supplies? No  Functional Questionnaire: (I = Independent and D = Dependent) ADLs: I  Bathing/Dressing- I  Meal Prep- I  Eating- I  Maintaining continence- I  Transferring/Ambulation- I  Managing Meds- I  Follow up appointments reviewed:  PCP Hospital f/u appt confirmed? Yes  Scheduled to see Dr [name] on 10-01-21 @ [facility] f/u appt confirmed? No  . Are transportation arrangements needed? No  If their condition worsens, is the pt aware to call PCP or go to the Emergency Dept.? Yes Was the patient provided with contact information for the PCP's office or ED? Yes Was to pt encouraged to call back with questions or concerns? Yes

## 2021-09-21 ENCOUNTER — Encounter: Payer: Self-pay | Admitting: Physician Assistant

## 2021-09-22 ENCOUNTER — Other Ambulatory Visit: Payer: Self-pay

## 2021-09-22 DIAGNOSIS — I639 Cerebral infarction, unspecified: Secondary | ICD-10-CM

## 2021-09-29 ENCOUNTER — Ambulatory Visit: Payer: 59 | Attending: Family Medicine | Admitting: Physical Therapy

## 2021-09-29 ENCOUNTER — Encounter: Payer: Self-pay | Admitting: Occupational Therapy

## 2021-09-29 ENCOUNTER — Ambulatory Visit: Payer: 59 | Admitting: Occupational Therapy

## 2021-09-29 ENCOUNTER — Encounter: Payer: Self-pay | Admitting: Physical Therapy

## 2021-09-29 VITALS — BP 165/73 | HR 55

## 2021-09-29 DIAGNOSIS — R2681 Unsteadiness on feet: Secondary | ICD-10-CM

## 2021-09-29 DIAGNOSIS — M6281 Muscle weakness (generalized): Secondary | ICD-10-CM

## 2021-09-29 DIAGNOSIS — R262 Difficulty in walking, not elsewhere classified: Secondary | ICD-10-CM | POA: Diagnosis present

## 2021-09-29 DIAGNOSIS — R208 Other disturbances of skin sensation: Secondary | ICD-10-CM

## 2021-09-29 DIAGNOSIS — R42 Dizziness and giddiness: Secondary | ICD-10-CM | POA: Diagnosis not present

## 2021-09-29 DIAGNOSIS — R2689 Other abnormalities of gait and mobility: Secondary | ICD-10-CM | POA: Insufficient documentation

## 2021-09-29 DIAGNOSIS — R278 Other lack of coordination: Secondary | ICD-10-CM | POA: Diagnosis present

## 2021-09-29 DIAGNOSIS — I639 Cerebral infarction, unspecified: Secondary | ICD-10-CM | POA: Insufficient documentation

## 2021-09-29 NOTE — Therapy (Signed)
OUTPATIENT OCCUPATIONAL THERAPY NEURO EVALUATION  Patient Name: [name] MRN: [id number] DOB:[birth year], 61 y.o., female Today's Date: 09/29/2021  PCP: [name]. [name] REFERRING PROVIDER: Myrene Buddy (Hospitalist) - Will send to PCP   OT End of Session - 09/29/21 1332     Visit Number 1    Number of Visits 9    Date for OT Re-Evaluation 11/28/21    Authorization Type [employer] - 30 VL OT/PT Combined    OT Start Time 1230    OT Stop Time 1315    OT Time Calculation (min) 45 min    Activity Tolerance Patient tolerated treatment well    Behavior During Therapy [facility] for tasks assessed/performed             Past Medical History:  Diagnosis Date   Abnormality of gait 05/10/2010   BACK PAIN 11/14/2008   Class 1 obesity due to excess calories with body mass index (BMI) of 31.0 to 31.9 in adult 02/07/2021   DIABETES MELLITUS, TYPE II 07/15/2008   Diplopia 07/15/2008   ECZEMA, ATOPIC 04/03/2009   GERD (gastroesophageal reflux disease)    Guillain-Barre ([place]) 1988   HYPERLIPIDEMIA 03/06/2009   HYPERTENSION 07/15/2008   Stroke ([place]) 2010, 2011   x2    Vertebral artery stenosis    Past Surgical History:  Procedure Laterality Date   ABDOMINAL HYSTERECTOMY     DILATION AND CURETTAGE OF UTERUS     FOOT SURGERY     IR ANGIO INTRA EXTRACRAN SEL COM CAROTID INNOMINATE UNI L MOD SED  09/17/2021   IR ANGIO INTRA EXTRACRAN SEL INTERNAL CAROTID UNI R MOD SED  09/17/2021   IR ANGIO VERTEBRAL SEL VERTEBRAL UNI R MOD SED  09/17/2021   IR US GUIDE VASC ACCESS RIGHT  09/17/2021   Patient Active Problem List   Diagnosis Date Noted   Stroke ([place]) 09/17/2021   Stage 3a chronic kidney disease (CKD) ([place]) 09/16/2021   Thyroid nodule 09/16/2021   Cervical lymphadenopathy 09/16/2021   Acute CVA (cerebrovascular accident) ([place]) 09/15/2021   GERD (gastroesophageal reflux disease) 04/30/2021   Peripheral arterial disease ([place]) 04/13/2021   Atherosclerosis of aorta ([place])  02/14/2021   Cerebral thrombosis with cerebral infarction 02/08/2021   Abnormal MRI of head 02/07/2021   Vertigo 02/07/2021   Class 1 obesity due to excess calories with body mass index (BMI) of 31.0 to 31.9 in adult 02/07/2021   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   Trigger finger, left ring finger 10/04/2018   Alternating constipation and diarrhea 04/20/2018   Diabetic peripheral neuropathy associated with type 2 diabetes mellitus ([place]) 02/26/2018   DM (diabetes mellitus), type 2 with peripheral vascular complications ([place]) [id number]/[id number]   ABNORMALITY OF GAIT 05/10/2010   ECZEMA, ATOPIC 04/03/2009   Hyperlipidemia associated with type 2 diabetes mellitus ([place]) 03/06/2009   BACK PAIN 11/14/2008   Cerebrovascular disease 08/05/2008   DIPLOPIA 07/15/2008   Essential hypertension 07/15/2008    ONSET DATE: 09/15/21  REFERRING DIAG: Bilateral cerebellum strokes  THERAPY DIAG:  Unsteadiness on feet  Muscle weakness (generalized)  Other lack of coordination  Other disturbances of skin sensation  Rationale for Evaluation and Treatment Rehabilitation  SUBJECTIVE:   SUBJECTIVE STATEMENT:   Less dizziness and my eyesight - my vision goes blur and double sometimes Pt accompanied by: family member - sister in [employer], manufacturing  PERTINENT HISTORY: HTN, DM- uncontrolled, obesity, lymphoma-   PRECAUTIONS: Fall  WEIGHT BEARING RESTRICTIONS No  PAIN:  Are you having pain? No  FALLS: Has  patient fallen in last 6 months? No  LIVING ENVIRONMENT: Lives with: lives alone - family plans to move in with her Lives in: House/apartment Stairs: 3 stairs - external no railing Has following equipment at home: Single point cane, Environmental consultant - 4 wheeled, shower chair, and Shower bench  PLOF: Independent with basic ADLs and Needs assistance with homemaking  PATIENT GOALS improve balance, reduce dizziness, improve vision  OBJECTIVE:   HAND DOMINANCE: Right  ADLs: Overall ADLs:   Transfers/ambulation related to ADLs:walking with rollator Eating: per report Mod I Grooming: Mod I UB Dressing: Mod I LB Dressing: Mod I Toileting: Supervision Bathing: supervision Tub Shower transfers: supervision Equipment: Transfer tub bench?   IADLs: Shopping: Assistance for driving, car transfers Light housekeeping: Family assisting Meal Prep: Family assistance Community mobility: Family assisting Medication management: Family assists Handwriting: 90% legible    POSTURE COMMENTS:  rounded shoulders, forward head, and posterior pelvic tilt Sitting balance:  dynamic sitting balance WFL during ADL  ACTIVITY TOLERANCE: Activity tolerance: reports fatigue since recent stroke    UPPER EXTREMITY ROM     Active ROM Right eval Left eval  Shoulder flexion [facility]  Shoulder abduction thruout thruout  Shoulder adduction    Shoulder extension    Shoulder internal rotation    Shoulder external rotation    Elbow flexion    Elbow extension    Wrist flexion    Wrist extension    Wrist ulnar deviation    Wrist radial deviation    Wrist pronation    Wrist supination    (Blank rows = not tested)   UPPER EXTREMITY MMT:     MMT Right eval Left eval  Shoulder flexion 4+/5 4/5  Shoulder abduction thruout thruout  Shoulder adduction    Shoulder extension    Shoulder internal rotation    Shoulder external rotation    Middle trapezius    Lower trapezius    Elbow flexion    Elbow extension    Wrist flexion    Wrist extension    Wrist ulnar deviation    Wrist radial deviation    Wrist pronation    Wrist supination    (Blank rows = not tested)  HAND FUNCTION: Grip strength: Right: 64.1 lbs; Left: 42.7 lbs and Lateral pinch: Right: 10 lbs, Left: 8 lbs  COORDINATION: Finger Nose Finger test: Dysmetria more pronounced in RUE 9 Hole Peg test: Right: 38.38 sec; Left: 48.19 sec  SENSATION: Light touch: Impaired  Stereognosis: Impaired  Evident in LUE - Less  ability to manipulate within hand Reports Neuropathy in hands and feet    MUSCLE TONE: RUE: Mild and Hypertonic and LUE: Mild  COGNITION: Overall cognitive status: Impaired: Attention: Impaired: Selective, Memory: Impaired: Short term, and Executive function: Impaired: Initiation, Problem solving, Planning, Error awareness, Self-correction, and Slow processing  VISION: Subjective report: wears glasses for reading - has had vision problems (diplopia and blurriness since first stroke in 2010) Baseline vision: Wears glasses for reading only Visual history: cataracts  VISION ASSESSMENT: Impaired Ocular ROM: restricted on looking up Tracking/Visual pursuits: Decreased smoothness with horizontal tracking Diplopia assessment: Patient challenged to describe - need further assessment  Patient has difficulty with following activities due to following visual impairments: reading, driving,   PERCEPTION: WFL  PRAXIS: WFL    TODAY'S TREATMENT:  Completed OT evaluation and determined potential goals for patient.  Somewhat challenging to determine most recent deficits as patient reports three prior strokes.     PATIENT EDUCATION: Education details: Eval  results Person educated: Patient Education method: Explanation Education comprehension: needs further education   HOME EXERCISE PROGRAM: TBD    GOALS: Goals reviewed with patient? Yes  SHORT TERM GOALS: Target date: 10/29/21  Patient will complete HEP designed to improve strength in  LUE, and coordination in BUE  Goal status: INITIAL  2.  Patient will demonstrate improved awareness of movements which exacerbate her symptoms of dizziness.  Goal status: INITIAL  3.  Patient will demonstrate knowledge of compensatory strategies to shower safely with no more than supervision  Goal status: INITIAL   LONG TERM GOALS: Target date: 11/28/21  Patient will complete updated HEP  Goal status: INITIAL  2.  Patient will report  ability to complete light housekeeping for 15 minutes without excessive dizziness  Goal status: INITIAL  3.  Patient will report improved independence with simple meal preparation  Goal status: INITIAL  4.  Patient will demonstrate increased awareness of movements and activities in which she has diplopia.    Goal status: INITIAL   ASSESSMENT:  CLINICAL IMPRESSION: Patient is a 61 year old woman admitted on 09/15/21 with unstable gait and dizziness. bilateral cerebellum punctate infarcts, likely secondary to large vessel disease source from bilateral VA occlusion, referred to OT to increase performance in BADL/IADL.    PERFORMANCE DEFICITS in functional skills including coordination, dexterity, sensation, tone, FMC, GMC, balance, endurance, decreased knowledge of use of DME, vision, and vestibular, cognitive skills including attention, memory, problem solving, and safety awareness, and psychosocial skills including habits.   IMPAIRMENTS are limiting patient from ADLs and IADLs.   COMORBIDITIES has co-morbidities such as HTN, DM  that affects occupational performance. Patient will benefit from skilled OT to address above impairments and improve overall function.  MODIFICATION OR ASSISTANCE TO COMPLETE EVALUATION: Min-Moderate modification of tasks or assist with assess necessary to complete an evaluation.  OT OCCUPATIONAL PROFILE AND HISTORY: Detailed assessment: Review of records and additional review of physical, cognitive, psychosocial history related to current functional performance.  CLINICAL DECISION MAKING: Moderate - several treatment options, min-mod task modification necessary  REHAB POTENTIAL: Good  EVALUATION COMPLEXITY: Moderate    PLAN: OT FREQUENCY: 1x/week  OT DURATION: 8 weeks  PLANNED INTERVENTIONS: self care/ADL training, therapeutic exercise, therapeutic activity, neuromuscular re-education, manual therapy, balance training, functional mobility training,  aquatic therapy, patient/family education, cognitive remediation/compensation, visual/perceptual remediation/compensation, and DME and/or AE instructions  RECOMMENDED OTHER SERVICES: PT eval completed today  CONSULTED AND AGREED WITH PLAN OF CARE: Patient and family member/caregiver  PLAN FOR NEXT SESSION: Review goals, Assess balance in functional environment - laundry/cooking.     Mariah Milling, OT 09/29/2021, 1:36 PM

## 2021-09-29 NOTE — Progress Notes (Unsigned)
HPI: [name] is a 61 y.o. female, who is here today to follow on recent hospitalization.  Hospitalized from 09/13/2021 to 09/17/2021. [place] call on 09/20/21.  He presented to the ER with acute ataxia and diplopia. 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 Head and neck CTA: 1. Bilateral distal V4 segment occlusion, which appears to have progressed since the MRA of 02/07/2021. 2. 1.6 cm incidental right thyroid nodule with heterogeneous and enlarged thyroid.  3. Multiple enlarged bilateral cervical lymph nodes, of uncertain etiology but concerning for lymphoproliferative disorder.  Aortic Atherosclerosis (ICD10-I70.0).  CTA: Severe stenosis in the vertebral arteries, worse when compared with prior imaging. Intervention radiology was consulted, catheter angiogram confirmed occluded vertebral, not a good candidate for stenting. Neurology consultation was done during hospitalization, recommended optimizing medical management.  Lab Results  Component Value Date   CHOL 308 (H) 09/16/2021   HDL 40 (L) 09/16/2021   LDLCALC 240 (H) 09/16/2021   LDLDIRECT 175.9 03/02/2012   TRIG 138 09/16/2021   CHOLHDL 7.7 09/16/2021   Echocardiogram on 09/16/2021: LVEF 60 to 65% and grade 2 diastolic dysfunction.  Currently she is on Aspirin 325 mg and Plavix 75 mg daily. He was recommended to continue following with neurologist as outpatient. She is doing outpt PT. Diplopia has resolved. She is using a cane. No new weakness or residual symptom.  She has a short disability form to be completed today.  HTN on Hydralazine 50 mg tid,Metoprolol succinate 50 gm daily,and Lisinopril-HCTZ 20-12.5 mg daily. Home BP's 130/70's. Negative for severe/frequent headache, chest pain, dyspnea, palpitation,or worsening edema. CKD III: She has not noted gross hematuria or foam in urine.  Component     Latest Ref Rng 09/17/2021  Sodium     135 - 145 mEq/L 137   Potassium     3.5 - 5.1 mEq/L 3.9    Chloride     96 - 112 mEq/L 106   CO2     19 - 32 mEq/L 24   Glucose     70 - 99 mg/dL 195 (H)   BUN     6 - 23 mg/dL 30 (H)   Creatinine     0.40 - 1.20 mg/dL 1.29 (H)   Calcium     8.4 - 10.5 mg/dL 8.9   Anion gap     5 - 15  7   GFR, Estimated     >60 mL/min 47 (L)     DM II:She is on Lantus 16 U daily. 45 during hospitalization x 1. Last appt with endocrinologist in 01/2021. She is trying to do better with following dietary recommendations. She eats out a few times per week.  Lab Results  Component Value Date   HGBA1C 12.9 (H) 09/16/2021   BS's 200's.  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  Next appointment on 03/2022.  Incidentally on neck CTA a 1.6 cm right thyroid nodule was noted. Lab Results  Component Value Date   TSH 0.50 08/19/2020   This morning she noted blood in toilet and some on tissue after defecation. Constipation, sometimes she has to strain. No dyschezia. Last bowel movement this morning. Negative for abdominal pain or melena.  Review of Systems  Constitutional:  Positive for fatigue. Negative for activity change, appetite change and fever.  HENT:  Negative for mouth sores, nosebleeds and sore throat.   Respiratory:  Negative for cough and wheezing.   Gastrointestinal:  Negative for nausea and vomiting.  Negative for changes in bowel habits.  Genitourinary:  Negative for decreased urine volume, dysuria and hematuria.  Musculoskeletal:  Positive for gait problem.  Neurological:  Positive for dizziness. Negative for syncope and facial asymmetry.  Rest see pertinent positives and negatives per HPI.  Current Outpatient Medications on File Prior to Visit  Medication Sig Dispense Refill   amLODipine (NORVASC) 10 MG tablet Take 1 tablet by mouth once daily (Patient taking differently: Take 10 mg by mouth every morning.) 30 tablet 3   aspirin 325 MG tablet Take 1 tablet (325 mg total) by mouth daily. 30 tablet 3    atorvastatin (LIPITOR) 80 MG tablet Take 1 tablet (80 mg total) by mouth daily. 30 tablet 3   B Complex Vitamins (VITAMIN B COMPLEX) TABS Take 1 tablet by mouth every morning.     Blood Pressure Monitoring (BLOOD PRESSURE MONITOR AUTOMAT) DEVI 1 Device by Does not apply route daily. 1 Device 0   Cholecalciferol (VITAMIN D3 PO) Take 1 capsule by mouth every morning.     cilostazol (PLETAL) 50 MG tablet Take 1 tablet (50 mg total) by mouth 2 (two) times daily. (Patient taking differently: Take 50 mg by mouth every morning.) 180 tablet 1   clopidogrel (PLAVIX) 75 MG tablet Take 1 tablet (75 mg total) by mouth daily. 30 tablet 3   Continuous Blood Gluc Sensor (FREESTYLE LIBRE 2 SENSOR) MISC 1 Device by Does not apply route every 14 (fourteen) days. 6 each 3   ezetimibe (ZETIA) 10 MG tablet Take 1 tablet (10 mg total) by mouth daily. 30 tablet 3   FLUoxetine (PROZAC) 20 MG capsule Take 1 capsule by mouth once daily (Patient taking differently: Take 20 mg by mouth every morning.) 90 capsule 0   glucose blood (CONTOUR NEXT TEST) test strip 1 each by Other route 2 (two) times daily. And lancets 2/day 200 each 3   glucose blood (ONETOUCH VERIO) test strip USE TO CHECK BLOOD SUGAR TWICE A DAY AND PRN 100 each 6   hydrALAZINE (APRESOLINE) 50 MG tablet Take 1 tablet (50 mg total) by mouth 3 (three) times daily. (Patient taking differently: Take 50 mg by mouth every morning.) 90 tablet 1   insulin glargine (LANTUS) 100 UNIT/ML Solostar Pen Inject 16 Units into the skin daily. And pen needles 1/day 6 mL 3   lisinopril-hydrochlorothiazide (ZESTORETIC) 20-12.5 MG tablet Take 2 tablets by mouth once daily (Patient taking differently: Take 2 tablets by mouth every morning.) 60 tablet 3   metoprolol succinate (TOPROL-XL) 50 MG 24 hr tablet TAKE 1 TABLET BY MOUTH ONCE DAILY WITH MEALS OR  IMMEDIATELY  FOLLOWING  A  MEAL (Patient taking differently: 50 mg every morning.) 30 tablet 3   ONE TOUCH LANCETS MISC USE TO CHECK  BLOOD SUGAR TWICE A DAY AND PRN 100 each 6   pantoprazole (PROTONIX) 40 MG tablet Take 1 tablet (40 mg total) by mouth daily. (Patient taking differently: Take 40 mg by mouth every morning.) 30 tablet 3   No current facility-administered medications on file prior to visit.    Past Medical History:  Diagnosis Date   Abnormality of gait 05/10/2010   BACK PAIN 11/14/2008   Class 1 obesity due to excess calories with body mass index (BMI) of 31.0 to 31.9 in adult 02/07/2021   DIABETES MELLITUS, TYPE II 07/15/2008   Diplopia 07/15/2008   ECZEMA, ATOPIC 04/03/2009   GERD (gastroesophageal reflux disease)    Guillain-Barre ([place]) 1988   HYPERLIPIDEMIA 03/06/2009  HYPERTENSION 07/15/2008   Stroke ([place]) 2010, 2011   x2    Vertebral artery stenosis    No Known Allergies  Social History   Socioeconomic History   Marital status: Divorced    Spouse name: Not on file   Number of children: 0   Years of education: 13   Highest education level: Not on file  Occupational History   Occupation: custodian at [employer]	[place]  Tobacco Use   Smoking status: Never   Smokeless tobacco: Never  Vaping Use   Vaping Use: Never used  Substance and Sexual Activity   Alcohol use: No   Drug use: No   Sexual activity: Not on file  Other Topics Concern   Not on file  Social History Narrative   Right Handed    Lives in a one story home    Social Determinants of Health   Financial Resource Strain: Not on file  Food Insecurity: Not on file  Transportation Needs: Not on file  Physical Activity: Not on file  Stress: Not on file  Social Connections: Not on file   Vitals:   10/01/21 0958  BP: 130/80  Pulse: 60  Resp: 16  SpO2: 97%  Body mass index is 32.66 kg/m.  Physical Exam Vitals and nursing note reviewed. Exam conducted with a chaperone present.  Constitutional:      General: She is not in acute distress.    Appearance: She is well-developed.  HENT:     Head: Normocephalic and  atraumatic.     Mouth/Throat:     Mouth: Mucous membranes are moist.     Pharynx: Oropharynx is clear.  Eyes:     Conjunctiva/sclera: Conjunctivae normal.  Cardiovascular:     Rate and Rhythm: Normal rate and regular rhythm.     Heart sounds: Murmur (SEM RUSB and LUSB) heard.     Comments: PT pulses present. Pulmonary:     Effort: Pulmonary effort is normal. No respiratory distress.     Breath sounds: Normal breath sounds.  Abdominal:     Palpations: Abdomen is soft. There is no mass.     Tenderness: There is no abdominal tenderness.  Genitourinary:    Rectum: Guaiac result negative. No mass, anal fissure or external hemorrhoid.     Comments: Hard stool in rectum. Lymphadenopathy:     Cervical: No cervical adenopathy.  Skin:    General: Skin is warm.     Findings: No erythema or rash.  Neurological:     General: No focal deficit present.     Mental Status: She is alert and oriented to person, place, and time.     Cranial Nerves: No cranial nerve deficit.     Comments: Unstable gait assisted by a cane.  Psychiatric:     Comments: Well groomed, good eye contact.   ASSESSMENT AND PLAN:  [name] was seen today for hospitalization follow-up.  Diagnoses and all orders for this visit: Orders Placed This Encounter  Procedures   US THYROID   Basic metabolic panel   Lipid panel   Ambulatory referral to Endocrinology   Ambulatory referral to Gastroenterology   Lab Results  Component Value Date   CREATININE 1.32 (H) 10/01/2021   BUN 36 (H) 10/01/2021   NA 132 (L) 10/01/2021   K 4.6 10/01/2021   CL 98 10/01/2021   CO2 28 10/01/2021   Cerebrovascular disease We discussed the importance of managing CV risk factors for secondary prevention. Continue Plavix 75 mg and Aspirin 325 mg  daily. She has an appt with neurologist. Continue Atorvastatin 80 mg daily and Zetia 10 mg daily.  Thyroid nodule Incidental finding on neck CTA. Thyroid US will be arranged.  DM (diabetes  mellitus), type 2 with peripheral vascular complications (HCC) LMB8M far from goal. Lantus increased from 16 U to 18 U. In a week if still getting 200's she can go a head and increase to 20 U. She has not followed with endocrinologist as instructed. Dr Ebony Hail has retired and according to pt,someone will contact her with new provider appt information. We discussed complications of elevated glucose. Endocrinology referral placed.  Rectal bleed This morning. Avoid straining. Adequate fiber and fluid intake to help with constipation. GI referral placed.  Essential hypertension BP adequately controlled. Continue monitoring BP and low salt diet. Continue Hydralazine 50 mg tid,Metoprolol succinate 50 gm daily,and Lisinopril-HCTZ 20-12.5 mg daily.  Hyperlipidemia LDL goal <70 Ideally < 55. LDL is not at goal, it was 240 on 09/16/21. Continue Atorvastatin 80 mg and Zetia 10 mg daily. Low fat diet also recommended. Will check FLP in 6 weeks.  Stage 3a chronic kidney disease (CKD) (HCC) Stable. Stressed the importance of adequate BP and glucose control. Continue avoiding NSAID's. Adequate hydration and low salt diet.  I spent a total of 49 minutes in both face to face and non face to face activities for this visit on the date of this encounter. During this time history was obtained and documented, examination was performed, prior labs/imaging reviewed, and assessment/plan discussed.  Return in about 3 months (around 01/01/2022).  [name] G. [name], MD  [facility]. [place] office.

## 2021-09-29 NOTE — Therapy (Unsigned)
OUTPATIENT PHYSICAL THERAPY NEURO EVALUATION   Patient Name: [name] MRN: [id number] DOB:11-Oct-1960, 61 y.o., female Today's Date: 09/30/2021   PCP: [name], [name], MD REFERRING PROVIDER: Edwin Dada, MD  Frann Rider for neurology follow-up  PT End of Session - 09/29/21 1153     Visit Number 1    Number of Visits 9   1+eval   Date for PT Re-Evaluation 12/10/21    Authorization Type [place]    PT Start Time 1147    PT Stop Time 1229    PT Time Calculation (min) 42 min    Equipment Utilized During Treatment Gait belt    Activity Tolerance Patient tolerated treatment well    Behavior During Therapy WFL for tasks assessed/performed             Past Medical History:  Diagnosis Date   Abnormality of gait 05/10/2010   BACK PAIN 11/14/2008   Class 1 obesity due to excess calories with body mass index (BMI) of 31.0 to 31.9 in adult 02/07/2021   DIABETES MELLITUS, TYPE II 07/15/2008   Diplopia 07/15/2008   ECZEMA, ATOPIC 04/03/2009   GERD (gastroesophageal reflux disease)    Guillain-Barre ([place]) 1988   HYPERLIPIDEMIA 03/06/2009   HYPERTENSION 07/15/2008   Stroke ([place]) 2010, 2011   x2    Vertebral artery stenosis    Past Surgical History:  Procedure Laterality Date   ABDOMINAL HYSTERECTOMY     DILATION AND CURETTAGE OF UTERUS     FOOT SURGERY     IR ANGIO INTRA EXTRACRAN SEL COM CAROTID INNOMINATE UNI L MOD SED  09/17/2021   IR ANGIO INTRA EXTRACRAN SEL INTERNAL CAROTID UNI R MOD SED  09/17/2021   IR ANGIO VERTEBRAL SEL VERTEBRAL UNI R MOD SED  09/17/2021   IR US GUIDE VASC ACCESS RIGHT  09/17/2021   Patient Active Problem List   Diagnosis Date Noted   Stroke ([place]) 09/17/2021   Stage 3a chronic kidney disease (CKD) ([place]) 09/16/2021   Thyroid nodule 09/16/2021   Cervical lymphadenopathy 09/16/2021   Acute CVA (cerebrovascular accident) ([place]) 09/15/2021   GERD (gastroesophageal reflux disease) 04/30/2021   Peripheral arterial disease  ([place]) 04/13/2021   Atherosclerosis of aorta ([place]) 02/14/2021   Cerebral thrombosis with cerebral infarction 02/08/2021   Abnormal MRI of head 02/07/2021   Vertigo 02/07/2021   Class 1 obesity due to excess calories with body mass index (BMI) of 31.0 to 31.9 in adult 02/07/2021   Small lymphocytic lymphoma ([place]) 10/08/2019   Trigger finger, left ring finger 10/04/2018   Alternating constipation and diarrhea 04/20/2018   Diabetic peripheral neuropathy associated with type 2 diabetes mellitus ([place]) 02/26/2018   DM (diabetes mellitus), type 2 with peripheral vascular complications ([place]) 38/18/2993   ABNORMALITY OF GAIT 05/10/2010   ECZEMA, ATOPIC 04/03/2009   Hyperlipidemia associated with type 2 diabetes mellitus ([place]) 03/06/2009   BACK PAIN 11/14/2008   Cerebrovascular disease 08/05/2008   DIPLOPIA 07/15/2008   Essential hypertension 07/15/2008    ONSET DATE: 09/16/2021 (referral date)  REFERRING DIAG: I63.9 (ICD-10-CM) - Acute stroke due to ischemia (HCC) I63.9 (ICD-10-CM) - Acute CVA (cerebrovascular accident) ([place]) R42 (ICD-10-CM) - Vertigo   THERAPY DIAG:  Unsteadiness on feet - Plan: PT plan of care cert/re-cert  Difficulty in walking, not elsewhere classified - Plan: PT plan of care cert/re-cert  Muscle weakness (generalized) - Plan: PT plan of care cert/re-cert  Other abnormalities of gait and mobility - Plan: PT plan of care cert/re-cert  Rationale for  Evaluation and Treatment Rehabilitation  SUBJECTIVE:    Today's Vitals   09/29/21 1223  BP: (!) 165/73  Pulse: (!) 55                                                                                                                                                                                           SUBJECTIVE STATEMENT: Pt and sister state that she never quite got back to 100% following prior stroke and still had intermittent dizziness.  Sister recalls having to go into grocery store to assist pt when she became  suddenly dizzy prior to most recent stroke.  "With this stroke everything feels like it's going backwards, my legs were weak, I have some double vision.  I've been nauseous" Pt accompanied by: self and family member-Jackie  PERTINENT HISTORY: Pt presented to ED on 09/15/2021 with unsteadiness of gait and dizziness starting around 12pm.  She was admitted for observation on 09/16/2021 and discharged on 09/17/2021. Will be followed by Frann Rider for neurology follow-up.  PMH:  DM2, hyperlipidemia, HTN, small cell lymphocytic impairment, GERD, prior CVA, vertigo-tried meclizine, lymphoma, PVD  PAIN:  Are you having pain? No  PRECAUTIONS: Fall  WEIGHT BEARING RESTRICTIONS No  FALLS: Has patient fallen in last 6 months? No  LIVING ENVIRONMENT: Lives with: lives alone Lives in: House/apartment Stairs: Yes: External: 3 steps; none Has following equipment at home: Single point cane, Walker - 4 wheeled, and shower chair  PLOF: Requires assistive device for independence States she needs increased time to move to prevent triggering dizziness PATIENT GOALS "To fix the dizziness that I have and the balance."  OBJECTIVE:   DIAGNOSTIC FINDINGS: IR angio performed on 09/17/2021 and MRI showed new cerebellar infarcts. CT angiogram showed severe vertebral artery disease, worse than prior.   COGNITION: Overall cognitive status: Impaired: Executive function: Impaired: Problem solving, Planning, and Slow processing-reported by pt and sister   SENSATION: Just N/T from foot to knee bilaterally and in bilateral fingertips  COORDINATION: LE RAMPS:  impaired Heel-to-shin:  slow and deliberate  EDEMA:  None noted in bilateral LE  MUSCLE TONE:  LLE difficulty to determine if mild clonus of pt with difficulty relaxing for assessment-pt states she never has issues with that leg moving involuntarily  RLE [facility]  POSTURE: rounded shoulders and forward head  LOWER EXTREMITY ROM:     Active  Right Eval  Left Eval  Hip flexion [facility]  Hip extension    Hip abduction " "  Hip adduction " "  Hip internal rotation    Hip external rotation    Knee flexion " "  Knee extension " "  Ankle dorsiflexion " "  Ankle plantarflexion    Ankle inversion    Ankle eversion     (Blank rows = not tested)  LOWER EXTREMITY MMT:    MMT Right Eval Left Eval  Hip flexion 4-/5 4-/5  Hip extension    Hip abduction 4/5 4/5  Hip adduction 5/5 5/5  Hip internal rotation Compensates w/ quad Compensates w/ quad  Hip external rotation 3/5 3/5  Knee flexion 4+/5 4+/5  Knee extension 4/5 4/5  Ankle dorsiflexion 4+/5 4+/5  Ankle plantarflexion    Ankle inversion    Ankle eversion    (Blank rows = not tested)  BED MOBILITY:  Pt reports independence, she sits on the EOB for a minute to prevent dizziness.  TRANSFERS: Assistive device utilized: Environmental consultant - 4 wheeled and None  Sit to stand: Modified independence Stand to sit: Modified independence Chair to chair: Modified independence  STAIRS:  Level of Assistance: SBA  Stair Negotiation Technique: Step to Pattern Alternating Pattern  with Single Rail on Right  Number of Stairs: 4   Height of Stairs: 6"  Comments: Pt demos use of step-to on first and last steps ascending and descending.  She does not use rail at home but pushes from piece of screen door that holds the door open.  GAIT: Gait pattern: step through pattern, decreased stride length, trunk flexed, and narrow BOS Distance walked: 115' Assistive device utilized: Environmental consultant - 4 wheeled Level of assistance: SBA and CGA Comments: Pt would benefit from possible rollator height adjustment to prevent forward trunk flexion, pt did not want adjustment this session.  FUNCTIONAL TESTs:  5xSTS:  20.91 seconds w/o UE support 10MWT:  8.80 seconds = 1.14 m/sec OR 3.75 ft/sec TUG: 15.05 seconds w/ rollator BERG:  To be assessed next visit.  PATIENT SURVEYS:  FOTO not completed.  TODAY'S TREATMENT:   N/A   PATIENT EDUCATION: Education details: PT POC, assessments used, and goals assessed. Person educated: Patient and [name] Education method: Explanation Education comprehension: verbalized understanding   HOME EXERCISE PROGRAM: To be established.    GOALS: Goals reviewed with patient? Yes  SHORT TERM GOALS: Target date: 10/29/2021  Pt will be independent with initial strength and balance HEP with supervision from family. Baseline:  To be established. Goal status: INITIAL  2.  Pt will decrease 5xSTS to <16 seconds in order to demonstrate decreased risk for falls and improved functional bilateral LE strength and power. Baseline: 20.91 seconds w/o UE support Goal status: INITIAL  3.  Pt will demonstrate a gait speed of >/= 1.2 m/sec in order to decrease risk for falls. Baseline: 1.14 m/sec Goal status: INITIAL  4.  BERG to be assessed with STG/LTG set as appropriate. Baseline: Not assessed on eval. Goal status: INITIAL   LONG TERM GOALS: Target date: 11/26/2021  Pt will be independent with finalized advanced strength and balance HEP to promote maintenance of gains at discharge. Baseline: To be established. Goal status: INITIAL  2.  Pt will decrease 5xSTS to <13 seconds in order to demonstrate decreased risk for falls and improved functional bilateral LE strength and power. Baseline: See STG. Goal status: INITIAL  3.  Pt will demonstrate TUG of <13 seconds in order to decrease risk of falls and improve functional mobility using LRAD. Baseline: 15.05 seconds w/ rollator Goal status: INITIAL  4.  BERG to be assessed with STG/LTG set as appropriate. Baseline: To be updated. Goal status: INITIAL  5.  Pt will manage 3 stairs w/ supervision  w/o a rail ascending and descending using step-to or reciprocal gait to promote safe independence with home setup. Baseline: 4 stairs step-to, unilateral rail Goal status: INITIAL   ASSESSMENT:  CLINICAL  IMPRESSION: Patient is a 61 y.o. female who was seen today for physical therapy evaluation and treatment for vertebrobasilar artery junction CVA which occurred within 1 year of prior stroke.  Pt has a significant PMH of DM2, hyperlipidemia, HTN, small cell lymphocytic impairment, GERD, prior CVA, vertigo-tried meclizine, lymphoma, PVD.  Identified impairments include mild gait deviations, dynamic balance difficulties, and need for instruction with stair management.  Evaluation via the following assessment tools: 5xSTS and TUG indicate fall risk.  BERG to be assessed in coming visits.  She will benefit from skilled PT to address impairments as noted and progress towards long term goals.    OBJECTIVE IMPAIRMENTS Abnormal gait, decreased activity tolerance, decreased balance, decreased endurance, decreased knowledge of use of DME, decreased mobility, and decreased strength.   ACTIVITY LIMITATIONS carrying, lifting, squatting, stairs, and locomotion level  PARTICIPATION LIMITATIONS: community activity  PERSONAL FACTORS Age, Fitness, Past/current experiences, Time since onset of injury/illness/exacerbation, and 3+ comorbidities: DM2, hyperlipidemia, HTN, small cell lymphocytic impairment, GERD, prior CVA, vertigo-tried meclizine, lymphoma, PVD  are also affecting patient's functional outcome.   REHAB POTENTIAL: Fair see personal factors  CLINICAL DECISION MAKING: Evolving/moderate complexity  EVALUATION COMPLEXITY: Moderate  PLAN: PT FREQUENCY: 1x/week  PT DURATION: 8 weeks  PLANNED INTERVENTIONS: Therapeutic exercises, Therapeutic activity, Neuromuscular re-education, Balance training, Gait training, Patient/Family education, Joint mobilization, Stair training, Vestibular training, DME instructions, Biofeedback, Manual therapy, and Re-evaluation  PLAN FOR NEXT SESSION: Assess BERG-if pt performs well complete FGA.  Initiate HEP for general strength and balance.  Work on Engineer, petroleum, step  ups, rollator management-may need to adjust height.   Bary Richard, PT, DPT 09/30/2021, 5:02 PM

## 2021-10-01 ENCOUNTER — Ambulatory Visit (INDEPENDENT_AMBULATORY_CARE_PROVIDER_SITE_OTHER): Payer: 59 | Admitting: Family Medicine

## 2021-10-01 ENCOUNTER — Encounter: Payer: Self-pay | Admitting: Family Medicine

## 2021-10-01 VITALS — BP 130/80 | HR 60 | Resp 16 | Ht 64.0 in | Wt 190.2 lb

## 2021-10-01 DIAGNOSIS — E1151 Type 2 diabetes mellitus with diabetic peripheral angiopathy without gangrene: Secondary | ICD-10-CM | POA: Diagnosis not present

## 2021-10-01 DIAGNOSIS — E785 Hyperlipidemia, unspecified: Secondary | ICD-10-CM | POA: Insufficient documentation

## 2021-10-01 DIAGNOSIS — I679 Cerebrovascular disease, unspecified: Secondary | ICD-10-CM | POA: Diagnosis not present

## 2021-10-01 DIAGNOSIS — K625 Hemorrhage of anus and rectum: Secondary | ICD-10-CM

## 2021-10-01 DIAGNOSIS — E041 Nontoxic single thyroid nodule: Secondary | ICD-10-CM

## 2021-10-01 DIAGNOSIS — N1831 Chronic kidney disease, stage 3a: Secondary | ICD-10-CM

## 2021-10-01 DIAGNOSIS — I10 Essential (primary) hypertension: Secondary | ICD-10-CM

## 2021-10-01 LAB — BASIC METABOLIC PANEL
BUN: 36 mg/dL — ABNORMAL HIGH (ref 6–23)
CO2: 28 mEq/L (ref 19–32)
Calcium: 10.4 mg/dL (ref 8.4–10.5)
Chloride: 98 mEq/L (ref 96–112)
Creatinine, Ser: 1.32 mg/dL — ABNORMAL HIGH (ref 0.40–1.20)
GFR: 43.31 mL/min — ABNORMAL LOW (ref >60.00)
Glucose, Bld: 424 mg/dL — ABNORMAL HIGH (ref 70–99)
Potassium: 4.6 mEq/L (ref 3.5–5.1)
Sodium: 132 mEq/L — ABNORMAL LOW (ref 135–145)

## 2021-10-01 NOTE — Assessment & Plan Note (Signed)
Stable. Stressed the importance of adequate BP and glucose control. Continue avoiding NSAID's. Adequate hydration and low salt diet.

## 2021-10-01 NOTE — Patient Instructions (Addendum)
A few things to remember from today's visit:   Thyroid nodule - Plan: US THYROID  Cerebrovascular disease  DM (diabetes mellitus), type 2 with peripheral vascular complications ([place]) - Plan: Ambulatory referral to Endocrinology  Hyperlipidemia LDL goal <70  Rectal bleed  Stage 3a chronic kidney disease (CKD) ([place]) - Plan: Basic metabolic panel  If you need refills please call your pharmacy. Do not use My Chart to request refills or for acute issues that need immediate attention.   Increase Lantus from 16 U to 18 U. No changes in rest of meds. Fasting labs in 6 weeks.   Please be sure medication list is accurate. If a new problem present, please set up appointment sooner than planned today.

## 2021-10-01 NOTE — Assessment & Plan Note (Signed)
LDL is not at goal. Will check FLP in 6 weeks.

## 2021-10-05 ENCOUNTER — Encounter: Payer: Self-pay | Admitting: Rehabilitation

## 2021-10-05 ENCOUNTER — Encounter: Payer: Self-pay | Admitting: Occupational Therapy

## 2021-10-05 ENCOUNTER — Ambulatory Visit: Payer: 59 | Admitting: Rehabilitation

## 2021-10-05 ENCOUNTER — Ambulatory Visit: Payer: 59 | Attending: Family Medicine | Admitting: Occupational Therapy

## 2021-10-05 DIAGNOSIS — R278 Other lack of coordination: Secondary | ICD-10-CM | POA: Insufficient documentation

## 2021-10-05 DIAGNOSIS — M6281 Muscle weakness (generalized): Secondary | ICD-10-CM | POA: Diagnosis present

## 2021-10-05 DIAGNOSIS — R2681 Unsteadiness on feet: Secondary | ICD-10-CM | POA: Insufficient documentation

## 2021-10-05 DIAGNOSIS — R2689 Other abnormalities of gait and mobility: Secondary | ICD-10-CM | POA: Diagnosis present

## 2021-10-05 DIAGNOSIS — R42 Dizziness and giddiness: Secondary | ICD-10-CM | POA: Insufficient documentation

## 2021-10-05 NOTE — Patient Instructions (Signed)
1. Grip Strengthening (Resistive Putty)   Squeeze putty using thumb and all fingers Lt hand. Repeat _20___ times. Do __2__ sessions per day.    Coordination Activities  Perform the following activities for 15 minutes 1-2 times per day with both hand(s).  Rotate ball in fingertips (clockwise and counter-clockwise). Toss ball in air and catch with the same hand (use bean bag Lt hand or have someone w/ you to get ball if it rolls away) . Flip cards 1 at a time as fast as you can (1/2 deck w/ Rt hand, 1/2 w/ Lt hand). Deal cards with your thumb (Hold deck in hand and push card off top with thumb, 1/2 deck w/ Rt hand, 1/2 deck w/ Lt hand). Rotate one card in hand (clockwise and counter-clockwise). Pick up coins one at a time until you get 5 in your hand, then move coins from palm to fingertips to stack one at a time. Do 3 stacks of 5 each hand Practice writing w/ Rt hand only

## 2021-10-05 NOTE — Therapy (Signed)
OUTPATIENT OCCUPATIONAL THERAPY NEURO TREATMENT  Patient Name: [name] MRN: [id number] DOB:12/31/1960, 61 y.o., female Today's Date: 10/05/2021  PCP: [name]. [name] REFERRING PROVIDER: Myrene Buddy (Hospitalist) - Will send to PCP   OT End of Session - 10/05/21 0946     Visit Number 2    Number of Visits 9    Date for OT Re-Evaluation 11/28/21    Authorization Type [employer] - 30 VL OT/PT Combined    OT Start Time 0935    OT Stop Time 1015    OT Time Calculation (min) 40 min    Activity Tolerance Patient tolerated treatment well    Behavior During Therapy [facility] for tasks assessed/performed             Past Medical History:  Diagnosis Date   Abnormality of gait 05/10/2010   BACK PAIN 11/14/2008   Class 1 obesity due to excess calories with body mass index (BMI) of 31.0 to 31.9 in adult 02/07/2021   DIABETES MELLITUS, TYPE II 07/15/2008   Diplopia 07/15/2008   ECZEMA, ATOPIC 04/03/2009   GERD (gastroesophageal reflux disease)    Guillain-Barre ([place]) 1988   HYPERLIPIDEMIA 03/06/2009   HYPERTENSION 07/15/2008   Stroke ([place]) 2010, 2011   x2    Vertebral artery stenosis    Past Surgical History:  Procedure Laterality Date   ABDOMINAL HYSTERECTOMY     DILATION AND CURETTAGE OF UTERUS     FOOT SURGERY     IR ANGIO INTRA EXTRACRAN SEL COM CAROTID INNOMINATE UNI L MOD SED  09/17/2021   IR ANGIO INTRA EXTRACRAN SEL INTERNAL CAROTID UNI R MOD SED  09/17/2021   IR ANGIO VERTEBRAL SEL VERTEBRAL UNI R MOD SED  09/17/2021   IR US GUIDE VASC ACCESS RIGHT  09/17/2021   Patient Active Problem List   Diagnosis Date Noted   Hyperlipidemia LDL goal <70 10/01/2021   Stroke ([place]) 09/17/2021   Stage 3a chronic kidney disease (CKD) ([place]) 09/16/2021   Thyroid nodule 09/16/2021   Cervical lymphadenopathy 09/16/2021   Acute CVA (cerebrovascular accident) ([place]) 09/15/2021   GERD (gastroesophageal reflux disease) 04/30/2021   Peripheral arterial disease ([place]) 04/13/2021    Atherosclerosis of aorta ([place]) 02/14/2021   Cerebral thrombosis with cerebral infarction 02/08/2021   Abnormal MRI of head 02/07/2021   Vertigo 02/07/2021   Class 1 obesity due to excess calories with body mass index (BMI) of 31.0 to 31.9 in adult 02/07/2021   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   Trigger finger, left ring finger 10/04/2018   Alternating constipation and diarrhea 04/20/2018   Diabetic peripheral neuropathy associated with type 2 diabetes mellitus ([place]) 02/26/2018   DM (diabetes mellitus), type 2 with peripheral vascular complications ([place]) 72/90/2111   ABNORMALITY OF GAIT 05/10/2010   ECZEMA, ATOPIC 04/03/2009   Hyperlipidemia associated with type 2 diabetes mellitus ([place]) 03/06/2009   BACK PAIN 11/14/2008   Cerebrovascular disease 08/05/2008   DIPLOPIA 07/15/2008   Essential hypertension 07/15/2008    ONSET DATE: 09/15/21  REFERRING DIAG: Bilateral cerebellum strokes  THERAPY DIAG:  Unsteadiness on feet  Other lack of coordination  Rationale for Evaluation and Treatment Rehabilitation  SUBJECTIVE:   SUBJECTIVE STATEMENT:   Less dizziness and my eyesight - my vision goes blur and double sometimes (mostly when reading)  PERTINENT HISTORY: HTN, DM- uncontrolled, obesity, lymphoma, previous strokes in 2010, 2011, and 2022  PRECAUTIONS: Fall  WEIGHT BEARING RESTRICTIONS No  PAIN:  Are you having pain? No  FALLS: Has patient fallen in last  6 months? No  LIVING ENVIRONMENT: Lives with: lives alone - family plans to move in with her Lives in: House/apartment Stairs: 3 stairs - external no railing Has following equipment at home: Single point cane, Environmental consultant - 4 wheeled, shower chair, and Shower bench  PLOF: Independent with basic ADLs and Needs assistance with homemaking  PATIENT GOALS improve balance, reduce dizziness, improve vision  OBJECTIVE:   HAND DOMINANCE: Right    TODAY'S TREATMENT:  Reviewed goals and progress to date.   Discussed safety w/ shower transfers and options to increase safety including grab bars for stepping in/out of shower w/ shower mat or stickers to prevent slipping/falls or tub transfer bench. Pt already has shower chair.   See pt instructions and below for details on HEP. Pt issued green resistance putty for Lt hand   PATIENT EDUCATION: Education details: coordination HEP, putty HEP  Person educated: Patient Education method: Explanation, Demonstration, Verbal cues, and Handouts Education comprehension: verbalized understanding and returned demonstration    HOME EXERCISE PROGRAM: 10/05/21: coordination and putty HEP     GOALS: Goals reviewed with patient? Yes  SHORT TERM GOALS: Target date: 10/29/21  Patient will complete HEP designed to improve strength in  LUE, and coordination in BUE  Goal status: IN PROGRESS  2.  Patient will demonstrate improved awareness of movements which exacerbate her symptoms of dizziness.  Goal status: INITIAL  3.  Patient will demonstrate knowledge of compensatory strategies to shower safely with no more than supervision  Goal status: IN PROGRESS   LONG TERM GOALS: Target date: 11/28/21  Patient will complete updated HEP  Goal status: INITIAL  2.  Patient will report ability to complete light housekeeping for 15 minutes without excessive dizziness  Goal status: INITIAL  3.  Patient will report improved independence with simple meal preparation  Goal status: INITIAL  4.  Patient will demonstrate increased awareness of movements and activities in which she has diplopia.    Goal status: INITIAL   ASSESSMENT:  CLINICAL IMPRESSION: Pt progressing towards STG's.   PERFORMANCE DEFICITS in functional skills including coordination, dexterity, sensation, tone, FMC, GMC, balance, endurance, decreased knowledge of use of DME, vision, and vestibular, cognitive skills including attention, memory, problem solving, and safety awareness, and  psychosocial skills including habits.   IMPAIRMENTS are limiting patient from ADLs and IADLs.   COMORBIDITIES has co-morbidities such as HTN, DM  that affects occupational performance. Patient will benefit from skilled OT to address above impairments and improve overall function.  MODIFICATION OR ASSISTANCE TO COMPLETE EVALUATION: Min-Moderate modification of tasks or assist with assess necessary to complete an evaluation.  OT OCCUPATIONAL PROFILE AND HISTORY: Detailed assessment: Review of records and additional review of physical, cognitive, psychosocial history related to current functional performance.  CLINICAL DECISION MAKING: Moderate - several treatment options, min-mod task modification necessary  REHAB POTENTIAL: Good  EVALUATION COMPLEXITY: Moderate    PLAN: OT FREQUENCY: 1x/week  OT DURATION: 8 weeks  PLANNED INTERVENTIONS: self care/ADL training, therapeutic exercise, therapeutic activity, neuromuscular re-education, manual therapy, balance training, functional mobility training, aquatic therapy, patient/family education, cognitive remediation/compensation, visual/perceptual remediation/compensation, and DME and/or AE instructions  RECOMMENDED OTHER SERVICES: PT eval completed today  CONSULTED AND AGREED WITH PLAN OF CARE: Patient and family member/caregiver  PLAN FOR NEXT SESSION: Assess balance in functional environment - laundry/cooking.     Hans Eden, OT 10/05/2021, 9:47 AM

## 2021-10-05 NOTE — Therapy (Signed)
OUTPATIENT PHYSICAL THERAPY TREATMENT NOTE   Patient Name: [name] MRN: [id number] DOB:1960/07/18, 61 y.o., female Today's Date: 10/05/2021  PCP: Betty [name], MD REFERRING PROVIDER: Betty [name], MD  END OF SESSION:   PT End of Session - 10/05/21 1020     Visit Number 2    Number of Visits 9   1+eval   Date for PT Re-Evaluation 12/10/21    Authorization Type [place]    PT Start Time 7062    PT Stop Time 1100    PT Time Calculation (min) 43 min    Equipment Utilized During Treatment Gait belt    Activity Tolerance Patient tolerated treatment well    Behavior During Therapy WFL for tasks assessed/performed             Past Medical History:  Diagnosis Date   Abnormality of gait 05/10/2010   BACK PAIN 11/14/2008   Class 1 obesity due to excess calories with body mass index (BMI) of 31.0 to 31.9 in adult 02/07/2021   DIABETES MELLITUS, TYPE II 07/15/2008   Diplopia 07/15/2008   ECZEMA, ATOPIC 04/03/2009   GERD (gastroesophageal reflux disease)    Guillain-Barre ([place]) 1988   HYPERLIPIDEMIA 03/06/2009   HYPERTENSION 07/15/2008   Stroke ([place]) 2010, 2011   x2    Vertebral artery stenosis    Past Surgical History:  Procedure Laterality Date   ABDOMINAL HYSTERECTOMY     DILATION AND CURETTAGE OF UTERUS     FOOT SURGERY     IR ANGIO INTRA EXTRACRAN SEL COM CAROTID INNOMINATE UNI L MOD SED  09/17/2021   IR ANGIO INTRA EXTRACRAN SEL INTERNAL CAROTID UNI R MOD SED  09/17/2021   IR ANGIO VERTEBRAL SEL VERTEBRAL UNI R MOD SED  09/17/2021   IR US GUIDE VASC ACCESS RIGHT  09/17/2021   Patient Active Problem List   Diagnosis Date Noted   Hyperlipidemia LDL goal <70 10/01/2021   Stroke ([place]) 09/17/2021   Stage 3a chronic kidney disease (CKD) ([place]) 09/16/2021   Thyroid nodule 09/16/2021   Cervical lymphadenopathy 09/16/2021   Acute CVA (cerebrovascular accident) ([place]) 09/15/2021   GERD (gastroesophageal reflux disease) 04/30/2021   Peripheral arterial disease  ([place]) 04/13/2021   Atherosclerosis of aorta ([place]) 02/14/2021   Cerebral thrombosis with cerebral infarction 02/08/2021   Abnormal MRI of head 02/07/2021   Vertigo 02/07/2021   Class 1 obesity due to excess calories with body mass index (BMI) of 31.0 to 31.9 in adult 02/07/2021   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   Trigger finger, left ring finger 10/04/2018   Alternating constipation and diarrhea 04/20/2018   Diabetic peripheral neuropathy associated with type 2 diabetes mellitus ([place]) 02/26/2018   DM (diabetes mellitus), type 2 with peripheral vascular complications ([place]) 37/62/8315   ABNORMALITY OF GAIT 05/10/2010   ECZEMA, ATOPIC 04/03/2009   Hyperlipidemia associated with type 2 diabetes mellitus ([place]) 03/06/2009   BACK PAIN 11/14/2008   Cerebrovascular disease 08/05/2008   DIPLOPIA 07/15/2008   Essential hypertension 07/15/2008    REFERRING DIAG: I63.9 (ICD-10-CM) - Acute stroke due to ischemia (HCC) I63.9 (ICD-10-CM) - Acute CVA (cerebrovascular accident) ([place]) R42 (ICD-10-CM) - Vertigo   THERAPY DIAG:  Unsteadiness on feet  Muscle weakness (generalized)  Other abnormalities of gait and mobility  Dizziness and giddiness  Rationale for Evaluation and Treatment Rehabilitation  PERTINENT HISTORY:  Pt presented to ED on 09/15/2021 with unsteadiness of gait and dizziness starting around 12pm.  She was admitted for observation on 09/16/2021 and discharged on  09/17/2021. Will be followed by Frann Rider for neurology follow-up.  PRECAUTIONS: Fall  SUBJECTIVE: Pt reports doing well today, no changes since last visit.   PAIN:  Are you having pain? No   Treatment:   Initiated HEP, see below for details on exercises and reps.    Also assessed FGA (with use of SPC):  [facility] PT Assessment - 10/05/21 1026       Functional Gait  Assessment   Gait assessed  Yes    Gait Level Surface Walks 20 ft, slow speed, abnormal gait pattern, evidence for imbalance or deviates  10-15 in outside of the 12 in walkway width. Requires more than 7 sec to ambulate 20 ft.    Change in Gait Speed Able to change speed, demonstrates mild gait deviations, deviates 6-10 in outside of the 12 in walkway width, or no gait deviations, unable to achieve a major change in velocity, or uses a change in velocity, or uses an assistive device.    Gait with Horizontal Head Turns Performs head turns smoothly with slight change in gait velocity (eg, minor disruption to smooth gait path), deviates 6-10 in outside 12 in walkway width, or uses an assistive device.    Gait with Vertical Head Turns Performs task with moderate change in gait velocity, slows down, deviates 10-15 in outside 12 in walkway width but recovers, can continue to walk.    Gait and Pivot Turn Pivot turns safely in greater than 3 sec and stops with no loss of balance, or pivot turns safely within 3 sec and stops with mild imbalance, requires small steps to catch balance.    Step Over Obstacle Is able to step over one shoe box (4.5 in total height) but must slow down and adjust steps to clear box safely. May require verbal cueing.    Gait with Narrow Base of Support Ambulates 4-7 steps.    Gait with Eyes Closed Walks 20 ft, slow speed, abnormal gait pattern, evidence for imbalance, deviates 10-15 in outside 12 in walkway width. Requires more than 9 sec to ambulate 20 ft.    Ambulating Backwards Walks 20 ft, uses assistive device, slower speed, mild gait deviations, deviates 6-10 in outside 12 in walkway width.    Steps Alternating feet, must use rail.    Total Score 15    FGA comment: < 19 = high risk fall            Gait:  Ambulated around track without AD x 2 sets of 230' with emphasis on improving gait speed, stride length, and arm swing.  Pt tolerated well and feel she will progress back to [facility] quickly.    Home Exercise Program:  Access Code: [email or web address] URL: [email or web address] Date: 10/05/2021 Prepared by:  Cameron Sprang  Exercises - Sit to Stand Without Arm Support  - 2 x daily - 7 x weekly - 1 sets - 10 reps - Tandem Walking with Counter Support  - 2 x daily - 7 x weekly - 1 sets - 3 reps - Walking March  - 2 x daily - 7 x weekly - 1 sets - 3 reps - Side Stepping with Resistance at Feet  - 1 x daily - 7 x weekly - 1 sets - 3 reps  Performed these in session today.  PATIENT EDUCATION: Education details: PT POC, assessments used, and goals assessed. Person educated: Patient and [name] Education method: Explanation Education comprehension: verbalized understanding     HOME EXERCISE PROGRAM: To be established.  GOALS: Goals reviewed with patient? Yes   SHORT TERM GOALS: Target date: 10/29/2021   Pt will be independent with initial strength and balance HEP with supervision from family. Baseline:  To be established. Goal status: INITIAL   2.  Pt will decrease 5xSTS to <16 seconds in order to demonstrate decreased risk for falls and improved functional bilateral LE strength and power. Baseline: 20.91 seconds w/o UE support Goal status: INITIAL   3.  Pt will demonstrate a gait speed of >/= 1.2 m/sec in order to decrease risk for falls. Baseline: 1.14 m/sec Goal status: INITIAL   4.  BERG to be assessed with STG/LTG set as appropriate. Baseline: Not assessed on eval. Goal status: INITIAL     LONG TERM GOALS: Target date: 11/26/2021   Pt will be independent with finalized advanced strength and balance HEP to promote maintenance of gains at discharge. Baseline: To be established. Goal status: INITIAL   2.  Pt will decrease 5xSTS to <13 seconds in order to demonstrate decreased risk for falls and improved functional bilateral LE strength and power. Baseline: See STG. Goal status: INITIAL   3.  Pt will demonstrate TUG of <13 seconds in order to decrease risk of falls and improve functional mobility using LRAD. Baseline: 15.05 seconds w/ rollator Goal status:  INITIAL   4.  BERG to be assessed with STG/LTG set as appropriate. Baseline: To be updated. Goal status: INITIAL   5.  Pt will manage 3 stairs w/ supervision w/o a rail ascending and descending using step-to or reciprocal gait to promote safe independence with home setup. Baseline: 4 stairs step-to, unilateral rail Goal status: INITIAL     ASSESSMENT:   CLINICAL IMPRESSION: Skilled session focused on formal assessment of balance with FGA.  PT did have her utilize [facility] as she would have done previously (she was not quite safe enough to complete without AD at this time).  Note score of 15/30 indicative of high fall risk.  Initiated HEP for balance and strength based on deficits from FGA.  Pt tolerated all very well.        OBJECTIVE IMPAIRMENTS Abnormal gait, decreased activity tolerance, decreased balance, decreased endurance, decreased knowledge of use of DME, decreased mobility, and decreased strength.    ACTIVITY LIMITATIONS carrying, lifting, squatting, stairs, and locomotion level   PARTICIPATION LIMITATIONS: community activity   PERSONAL FACTORS Age, Fitness, Past/current experiences, Time since onset of injury/illness/exacerbation, and 3+ comorbidities: DM2, hyperlipidemia, HTN, small cell lymphocytic impairment, GERD, prior CVA, vertigo-tried meclizine, lymphoma, PVD  are also affecting patient's functional outcome.    REHAB POTENTIAL: Fair see personal factors   CLINICAL DECISION MAKING: Evolving/moderate complexity   EVALUATION COMPLEXITY: Moderate   PLAN: PT FREQUENCY: 1x/week   PT DURATION: 8 weeks   PLANNED INTERVENTIONS: Therapeutic exercises, Therapeutic activity, Neuromuscular re-education, Balance training, Gait training, Patient/Family education, Joint mobilization, Stair training, Vestibular training, DME instructions, Biofeedback, Manual therapy, and Re-evaluation   PLAN FOR NEXT SESSION: How is HEP going? Add as able. Work on Engineer, petroleum, step ups,  rollator management-may need to adjust height.  I feel like she's pretty close to using Daisy indoors, so work on obstacles, stepping over objects, maybe even outdoors if she can tolerate from an energy standpoint.    Cameron Sprang, PT, MPT [facility] [address] [place], [place], [address] Phone: [phone]   Fax:  [phone] 10/05/21, 11:16 AM

## 2021-10-06 ENCOUNTER — Ambulatory Visit: Payer: 59 | Admitting: Physician Assistant

## 2021-10-06 ENCOUNTER — Encounter: Payer: Self-pay | Admitting: Physician Assistant

## 2021-10-06 VITALS — BP 173/85 | HR 54 | Resp 20 | Ht 64.0 in | Wt 191.0 lb

## 2021-10-06 DIAGNOSIS — I639 Cerebral infarction, unspecified: Secondary | ICD-10-CM

## 2021-10-06 DIAGNOSIS — I679 Cerebrovascular disease, unspecified: Secondary | ICD-10-CM

## 2021-10-06 DIAGNOSIS — I10 Essential (primary) hypertension: Secondary | ICD-10-CM | POA: Diagnosis not present

## 2021-10-06 NOTE — Progress Notes (Signed)
[facility] Clinic Note - Initial Visit   Date: 10/06/21  [name] MRN: [id number] DOB: 1960/11/26   Dear Dr [name], Lisa G, MD:     History of Present Illness: [name] is a 61 y.o. 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 Posey Pronto.  The patient was scheduled to see Dr. Posey Pronto in December of this year in follow-up, however after she sustained another acute CVA while on aspirin only on 09/15/2021. She is here after her hospitalization.  History of present illness: In early [place] began having sudden onset of dizziness and was initially seen by ENT who ordered out-patient MRI brain and showed possible subacute punctate infarct in the left pons.  Over the next three days, she began having double vision and left arm and leg numbness/tingling which prompted her to go to [facility] ER. She was on Plavix at home. Repeat MRI brain showed two early acute infracts in the left pons and small chronic cerebellar infarcts. MRA showed right V4 occlusion, near occlusion of the left V4, as well we progressive atherosclerosis disease involving the intracranial vessels with severe M1 and M2 stenosis bilaterally.   She was started on aspirin '81mg'$  and continued with Plavix '75mg'$  daily. Her LDL was elevated at 169, so Lipitor was increased to '80mg'$  daily.  HbA1c very elevated 13.9.  She was discharged home and going to out-patient PT. She continues to have dizzy spells which can occur suddenly and last 30 minutes. She has started to use a cane because of her associated imbalance. Her left side remains numbness, but is hopefully PT will help.     She works as a Sports coach, but unable to return to work due to duties requiring her to climb ladders, which she is no longer advised to  do. She is concerned about her future job outlook and being able to meet her financial obligations.   UPDATE 07/23/2021:  She is here for 78-monthstroke follow-up.  She feels that there is mild improvement in dizziness and left sided sensory loss.  She has less frequent spells of dizziness.  No change in balance, she continues to use a cane.  She is no longer working.  She is now on aspirin '81mg'$  daily and stopped Plavix after three months.  No new complaints today.  BP is very high today, she denies any symptoms of discomfort and attributes this to coming from playing a very intense UNO card game with her friends.   Update 10/06/2021 Patient presented to the ED on 09/15/2021 with stroke like symptoms.  She felt unsteady, as she was to fall and very mild dysarthria.  She also had an episode of diplopia on the night prior to the admission.  She denied any numbness or weakness or tingling.  She had no cardiac complaints or respiratory complaints.  On presentation, she did not have any residual symptoms from the prior stroke.  She was compliant to her medications, especially to her aspirating.  Prior, she was on aspirin and had finished 3 months of Plavix .At the time of presentation she was on aspirin alone.  She was outside of the window of tPA. Denies vertigo dizziness, headaches, or dysphagia. No confusion or seizures. No tobacco. No new meds or hormonal supplements.  Denies any recent long distance trips or recent surgeries. No  sick contacts. No new stressors present in personal life, but does get excited when she plays UNO and "raises my blood pressure delayed 200 ".  That A1c is still very elevated at 12. Suspecting that the strokes were from progression of atherosclerotic disease in the setting  of uncontrolled risk factors such as diabetes, and elevated blood pressure, she was admitted for further evaluation.  MRI of the brain showed new cerebellar infarcts.  CT angio showed severe vertebral artery disease,  worse than prior.  IR was consulted, performing CTA, confirming occluded vertebral artery, without intervention possible to open.  Neurology at the hospital and recommended optimizing medical management.  LDL was 240.  Echo showed no cardiogenic source.  Carotid imaging was unremarkable.  A-fib was not present on telemetry.  She is a non-smoker.  She was discharged on aspirin 325 mg and Plavix daily.  Atorvastatin was resumed, and Zetia was added.  She had some diplopia, which was resolved at the time of this evaluation.  She is compliant in all the medications.  At the time of discharge, the patient had mild ataxia, but ambulatory with a cane.  She still has mild left finger "slow movements as described by her ".  But she has started physical therapy, and this is improving.  Out-side paper records, electronic medical record, and images have been reviewed where available and summarized as:  Lab Results  Component Value Date   HGBA1C 12.9 (H) 09/16/2021   Lab Results  Component Value Date   [id number] 03/23/2018   Lab Results  Component Value Date   TSH 0.50 08/19/2020   Lab Results  Component Value Date   ESRSEDRATE 14 07/15/2008    Past Medical History:  Diagnosis Date   Abnormality of gait 05/10/2010   BACK PAIN 11/14/2008   Class 1 obesity due to excess calories with body mass index (BMI) of 31.0 to 31.9 in adult 02/07/2021   DIABETES MELLITUS, TYPE II 07/15/2008   Diplopia 07/15/2008   ECZEMA, ATOPIC 04/03/2009   GERD (gastroesophageal reflux disease)    Guillain-Barre ([place]) 1988   HYPERLIPIDEMIA 03/06/2009   HYPERTENSION 07/15/2008   Stroke ([place]) 2010, 2011   x2    Vertebral artery stenosis     Past Surgical History:  Procedure Laterality Date   ABDOMINAL HYSTERECTOMY     DILATION AND CURETTAGE OF UTERUS     FOOT SURGERY     IR ANGIO INTRA EXTRACRAN SEL COM CAROTID INNOMINATE UNI L MOD SED  09/17/2021   IR ANGIO INTRA EXTRACRAN SEL INTERNAL CAROTID UNI R MOD SED   09/17/2021   IR ANGIO VERTEBRAL SEL VERTEBRAL UNI R MOD SED  09/17/2021   IR US GUIDE VASC ACCESS RIGHT  09/17/2021     Medications:  Outpatient Encounter Medications as of 10/06/2021  Medication Sig Note   amLODipine (NORVASC) 10 MG tablet Take 1 tablet by mouth once daily (Patient taking differently: Take 10 mg by mouth every morning.)    aspirin 325 MG tablet Take 1 tablet (325 mg total) by mouth daily.    atorvastatin (LIPITOR) 80 MG tablet Take 1 tablet (80 mg total) by mouth daily.    B Complex Vitamins (VITAMIN B COMPLEX) TABS Take 1 tablet by mouth every morning.    Blood Pressure Monitoring (BLOOD PRESSURE MONITOR AUTOMAT) DEVI 1 Device by Does not apply route daily.    Cholecalciferol (VITAMIN D3 PO) Take 1 capsule by mouth every morning.    cilostazol (PLETAL) 50 MG tablet Take  1 tablet (50 mg total) by mouth 2 (two) times daily. (Patient taking differently: Take 50 mg by mouth every morning.) 09/16/2021: Prescribed twice daily - pt states that she only takes once daily   clopidogrel (PLAVIX) 75 MG tablet Take 1 tablet (75 mg total) by mouth daily.    Continuous Blood Gluc Sensor (FREESTYLE LIBRE 2 SENSOR) MISC 1 Device by Does not apply route every 14 (fourteen) days.    ezetimibe (ZETIA) 10 MG tablet Take 1 tablet (10 mg total) by mouth daily.    FLUoxetine (PROZAC) 20 MG capsule Take 1 capsule by mouth once daily (Patient taking differently: Take 20 mg by mouth every morning.)    glucose blood (CONTOUR NEXT TEST) test strip 1 each by Other route 2 (two) times daily. And lancets 2/day    glucose blood (ONETOUCH VERIO) test strip USE TO CHECK BLOOD SUGAR TWICE A DAY AND PRN    hydrALAZINE (APRESOLINE) 50 MG tablet Take 1 tablet (50 mg total) by mouth 3 (three) times daily. (Patient taking differently: Take 50 mg by mouth every morning.) 09/16/2021: Prescribed three times daily - pt states that she only takes once daily    insulin glargine (LANTUS) 100 UNIT/ML Solostar Pen Inject 16 Units  into the skin daily. And pen needles 1/day    lisinopril-hydrochlorothiazide (ZESTORETIC) 20-12.5 MG tablet Take 2 tablets by mouth once daily (Patient taking differently: Take 2 tablets by mouth every morning.)    metoprolol succinate (TOPROL-XL) 50 MG 24 hr tablet TAKE 1 TABLET BY MOUTH ONCE DAILY WITH MEALS OR  IMMEDIATELY  FOLLOWING  A  MEAL (Patient taking differently: 50 mg every morning.)    ONE TOUCH LANCETS MISC USE TO CHECK BLOOD SUGAR TWICE A DAY AND PRN    pantoprazole (PROTONIX) 40 MG tablet Take 1 tablet (40 mg total) by mouth daily. (Patient taking differently: Take 40 mg by mouth every morning.)    No facility-administered encounter medications on file as of 10/06/2021.    Allergies: No Known Allergies  Family History: Family History  Problem Relation Age of Onset   Diabetes Sister    Asthma Other    Stroke Other    Hypertension Other    Diabetes Mother    Stroke Mother    Cancer Father        pt states hae had some kind of stomach cancer, ? stomach or colon   Breast cancer Neg Hx     Social History: Social History   Tobacco Use   Smoking status: Never   Smokeless tobacco: Never  Vaping Use   Vaping Use: Never used  Substance Use Topics   Alcohol use: No   Drug use: No   Social History   Social History Narrative   Right Handed    Lives in a one story home    No caffeine    Vital Signs:  BP (!) 173/85   Pulse (!) 54   Resp 20   Ht '5\' 4"'$  (1.626 m)   Wt 191 lb (86.6 kg)   SpO2 96%   BMI 32.79 kg/m    General Medical Exam:   General:  Well appearing, comfortable.   Eyes/ENT: see cranial nerve examination.   Neck:   No carotid bruits. Respiratory:  Clear to auscultation, good air entry bilaterally.   Cardiac:  Regular rate and rhythm, no murmur.   Extremities:  No deformities, edema, or skin discoloration.  Skin:  No rashes or lesions.  Neurological Exam: MENTAL STATUS including  orientation to time, place, person, recent and remote memory,  attention span and concentration, language, and fund of knowledge is normal.  Speech is not dysarthric.  CRANIAL NERVES: II:  No visual field defects.  Unremarkable fundi.   III-IV-VI: Pupils equal round and reactive to light.  Normal conjugate, extra-ocular eye movements in all directions of gaze.  No nystagmus.  No ptosis.   V:  Normal facial sensation.    VII:  Normal facial symmetry and movements.   VIII:  Normal hearing and vestibular function.   IX-X:  Normal palatal movement.   XI:  Normal shoulder shrug and head rotation.   XII:  Normal tongue strength and range of motion, no deviation or fasciculation.  MOTOR:  No atrophy, fasciculations or abnormal movements.  No pronator drift.    SENSORY:  Normal and symmetric perception of light touch, pinprick, vibration, and proprioception.  Romberg's sign absent.   COORDINATION/GAIT: Normal finger-to- nose-finger and heel-to-shin.  Intact rapid alternating movements on the RUE, less rapid on the L   Able to rise from a chair but needs arms to stand, then  a R cane.  Gait narrow based and stable. stressed gait intact.    IMPRESSION/PLAN: Acute CVA due to elevated blood pressure   MRI of the brain showed new cerebellar infarcts.  CT angio showed severe vertebral artery disease, worse than prior.  IR was consulted, performing CTA, confirming occluded vertebral artery, without intervention possible to open.  Neurology at the hospital and recommended optimizing medical management.  LDL was 240.  Echo showed no cardiogenic source.  Carotid imaging was unremarkable.  A-fib was not present on telemetry.  She is a non-smoker.  She was discharged on aspirin 325 mg and Plavix daily.  Atorvastatin was resumed, and Zetia was added.  Continue aspirin 325 mg daily Continue Plavix 75 mg long-term Continue Zetia and atorvastatin Continue blood pressure medications as per cardiology. Continue to monitor blood sugars carefully Avoid stressful situations to  provoke increasing blood pressure Continue to monitor CKD stage III Continue physical therapy Follow-up with Dr. Posey Pronto as scheduled   Thank you for allowing me to participate in patient's care.  If I can answer any additional questions, I would be pleased to do so.   Time Spent 60 mins   Sincerely,   Sharene Butters, PA-C

## 2021-10-06 NOTE — Patient Instructions (Addendum)
Continue aspirin 325 mg daily and plavix for '75mg'$  daily   Continue atorvastatin '80mg'$  daily and Zetia 10 mg daily   Continue toprol  XL 50 mg daily, Norvasc as directed   Consider endocrinology follow up for elevated A1C, since is still high and you were a patient of Dr Loanne Drilling in the past)   Follow up with Cardiology   Continue physical therapy  Return to clinic in with Dr. Posey Pronto as scheduled

## 2021-10-07 ENCOUNTER — Telehealth (HOSPITAL_COMMUNITY): Payer: Self-pay

## 2021-10-07 ENCOUNTER — Encounter: Payer: 59 | Admitting: Occupational Therapy

## 2021-10-07 ENCOUNTER — Ambulatory Visit: Payer: 59

## 2021-10-07 NOTE — Telephone Encounter (Signed)
Transitions of Care Pharmacy   Call attempted for a pharmacy transitions of care follow-up. Unable to leave voicemail.   Call attempt #2. Will follow-up in 2-3 days.    

## 2021-10-13 ENCOUNTER — Ambulatory Visit: Payer: 59 | Admitting: Occupational Therapy

## 2021-10-13 ENCOUNTER — Ambulatory Visit: Payer: 59

## 2021-10-13 ENCOUNTER — Encounter: Payer: Self-pay | Admitting: Occupational Therapy

## 2021-10-13 DIAGNOSIS — R2681 Unsteadiness on feet: Secondary | ICD-10-CM | POA: Diagnosis not present

## 2021-10-13 DIAGNOSIS — M6281 Muscle weakness (generalized): Secondary | ICD-10-CM

## 2021-10-13 DIAGNOSIS — R278 Other lack of coordination: Secondary | ICD-10-CM
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
## 2021-10-13 NOTE — Therapy (Signed)
OUTPATIENT OCCUPATIONAL THERAPY NEURO TREATMENT  Patient Name: [name] MRN: [id number] DOB:Dec 29, 1960, 61 y.o., female Today's Date: 10/13/2021  PCP: [name]. [name] REFERRING PROVIDER: Myrene Buddy (Hospitalist) - Will send to PCP   OT End of Session - 10/13/21 0939     Visit Number 3    Number of Visits 9    Date for OT Re-Evaluation 11/28/21    Authorization Type [employer] - 30 VL OT/PT Combined    OT Start Time 0935    OT Stop Time 1015    OT Time Calculation (min) 40 min    Activity Tolerance Patient tolerated treatment well    Behavior During Therapy [facility] for tasks assessed/performed             Past Medical History:  Diagnosis Date   Abnormality of gait 05/10/2010   BACK PAIN 11/14/2008   Class 1 obesity due to excess calories with body mass index (BMI) of 31.0 to 31.9 in adult 02/07/2021   DIABETES MELLITUS, TYPE II 07/15/2008   Diplopia 07/15/2008   ECZEMA, ATOPIC 04/03/2009   GERD (gastroesophageal reflux disease)    Guillain-Barre ([place]) 1988   HYPERLIPIDEMIA 03/06/2009   HYPERTENSION 07/15/2008   Stroke ([place]) 2010, 2011   x2    Vertebral artery stenosis    Past Surgical History:  Procedure Laterality Date   ABDOMINAL HYSTERECTOMY     DILATION AND CURETTAGE OF UTERUS     FOOT SURGERY     IR ANGIO INTRA EXTRACRAN SEL COM CAROTID INNOMINATE UNI L MOD SED  09/17/2021   IR ANGIO INTRA EXTRACRAN SEL INTERNAL CAROTID UNI R MOD SED  09/17/2021   IR ANGIO VERTEBRAL SEL VERTEBRAL UNI R MOD SED  09/17/2021   IR US GUIDE VASC ACCESS RIGHT  09/17/2021   Patient Active Problem List   Diagnosis Date Noted   Hyperlipidemia LDL goal <70 10/01/2021   Stroke ([place]) 09/17/2021   Stage 3a chronic kidney disease (CKD) ([place]) 09/16/2021   Thyroid nodule 09/16/2021   Cervical lymphadenopathy 09/16/2021   Acute CVA (cerebrovascular accident) ([place]) 09/15/2021   GERD (gastroesophageal reflux disease) 04/30/2021   Peripheral arterial disease ([place]) 04/13/2021    Atherosclerosis of aorta ([place]) 02/14/2021   Cerebral thrombosis with cerebral infarction 02/08/2021   Abnormal MRI of head 02/07/2021   Vertigo 02/07/2021   Class 1 obesity due to excess calories with body mass index (BMI) of 31.0 to 31.9 in adult 02/07/2021   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   Trigger finger, left ring finger 10/04/2018   Alternating constipation and diarrhea 04/20/2018   Diabetic peripheral neuropathy associated with type 2 diabetes mellitus ([place]) 02/26/2018   DM (diabetes mellitus), type 2 with peripheral vascular complications ([place]) 87/86/7672   ABNORMALITY OF GAIT 05/10/2010   ECZEMA, ATOPIC 04/03/2009   Hyperlipidemia associated with type 2 diabetes mellitus ([place]) 03/06/2009   BACK PAIN 11/14/2008   Cerebrovascular disease 08/05/2008   DIPLOPIA 07/15/2008   Essential hypertension 07/15/2008    ONSET DATE: 09/15/21  REFERRING DIAG: Bilateral cerebellum strokes  THERAPY DIAG:  Unsteadiness on feet  Muscle weakness (generalized)  Other lack of coordination  Rationale for Evaluation and Treatment Rehabilitation  SUBJECTIVE:   SUBJECTIVE STATEMENT:   Mild dizziness today  PERTINENT HISTORY: HTN, DM- uncontrolled, obesity, lymphoma, previous strokes in 2010, 2011, and 2022  PRECAUTIONS: Fall  WEIGHT BEARING RESTRICTIONS No  PAIN:  Are you having pain? No  FALLS: Has patient fallen in last 6 months? No  LIVING ENVIRONMENT: Lives with: lives  alone - family plans to move in with her Lives in: House/apartment Stairs: 3 stairs - external no railing Has following equipment at home: Single point cane, Walker - 4 wheeled, shower chair, and Shower bench  PLOF: Independent with basic ADLs and Needs assistance with homemaking  PATIENT GOALS improve balance, reduce dizziness, improve vision  OBJECTIVE:   HAND DOMINANCE: Right    TODAY'S TREATMENT:  Pt's daughter present for today's session.  Discussed safety and fall prevention with  rollator negotiation in kitchen for kitchen tasks. Therapist demo how to properly use, lock, and place at end of counter when side stepping along counter and then how to safely get items in/out of refrigerator using rollator. Pt then return demo w/ mod cueing t/o for safety. Pt practiced getting pot out of lower cabinet and filling up w/ water to put on stove, then removing. Pt also practiced getting things out of refrigerator.  Recommended pt practice in her home environment w/ daughter supervision to reinforce safety techniques and strategies. Also recommended use of toaster oven on countertop as safer option vs. Getting things in/out regular oven.   Reviewed coordination activities from HEP w/ pt/daughter and made extra copy of HEP for daughter.      HOME EXERCISE PROGRAM: 10/05/21: coordination and putty HEP     GOALS: Goals reviewed with patient? Yes  SHORT TERM GOALS: Target date: 10/29/21  Patient will complete HEP designed to improve strength in  LUE, and coordination in BUE  Goal status: IN PROGRESS  2.  Patient will demonstrate improved awareness of movements which exacerbate her symptoms of dizziness.  Goal status: INITIAL  3.  Patient will demonstrate knowledge of compensatory strategies to shower safely with no more than supervision  Goal status: IN PROGRESS   LONG TERM GOALS: Target date: 11/28/21  Patient will complete updated HEP  Goal status: INITIAL  2.  Patient will report ability to complete light housekeeping for 15 minutes without excessive dizziness  Goal status: INITIAL  3.  Patient will report improved independence with simple meal preparation  Goal status: INITIAL  4.  Patient will demonstrate increased awareness of movements and activities in which she has diplopia.    Goal status: INITIAL   ASSESSMENT:  CLINICAL IMPRESSION: Pt progressing towards STG's. Pt/family w/ greater understanding of fall prevention strategies w/ kitchen  tasks  PERFORMANCE DEFICITS in functional skills including coordination, dexterity, sensation, tone, FMC, GMC, balance, endurance, decreased knowledge of use of DME, vision, and vestibular, cognitive skills including attention, memory, problem solving, and safety awareness, and psychosocial skills including habits.   IMPAIRMENTS are limiting patient from ADLs and IADLs.   COMORBIDITIES has co-morbidities such as HTN, DM  that affects occupational performance. Patient will benefit from skilled OT to address above impairments and improve overall function.  MODIFICATION OR ASSISTANCE TO COMPLETE EVALUATION: Min-Moderate modification of tasks or assist with assess necessary to complete an evaluation.  OT OCCUPATIONAL PROFILE AND HISTORY: Detailed assessment: Review of records and additional review of physical, cognitive, psychosocial history related to current functional performance.  CLINICAL DECISION MAKING: Moderate - several treatment options, min-mod task modification necessary  REHAB POTENTIAL: Good  EVALUATION COMPLEXITY: Moderate    PLAN: OT FREQUENCY: 1x/week  OT DURATION: 8 weeks  PLANNED INTERVENTIONS: self care/ADL training, therapeutic exercise, therapeutic activity, neuromuscular re-education, manual therapy, balance training, functional mobility training, aquatic therapy, patient/family education, cognitive remediation/compensation, visual/perceptual remediation/compensation, and DME and/or AE instructions  RECOMMENDED OTHER SERVICES: PT eval completed today  CONSULTED AND AGREED  WITH PLAN OF CARE: Patient and family member/caregiver  PLAN FOR NEXT SESSION: simulate laundry task, simple meal prep (sandwich or stovetop)   Hans Eden, OT 10/13/2021, 9:39 AM

## 2021-10-14 ENCOUNTER — Telehealth (HOSPITAL_COMMUNITY): Payer: Self-pay

## 2021-10-14 ENCOUNTER — Other Ambulatory Visit (HOSPITAL_COMMUNITY): Payer: Self-pay

## 2021-10-14 NOTE — Telephone Encounter (Signed)
Pharmacy Transitions of Care Follow-up Telephone Call  Date of discharge: 09/17/21  Discharge Diagnosis: Acute CVA  How have you been since you were released from the hospital? Patient doing well, no questions about meds at this time   Medication changes made at discharge:    START taking: aspirin  This replaces a similar medication. See the full medication list for instructions. clopidogrel (PLAVIX)  ezetimibe (ZETIA)  CHANGE how you take: insulin glargine (LANTUS)  STOP taking: aspirin EC 81 MG tablet  Replaced by a similar medication. topiramate 25 MG tablet (Topamax)   Medication changes verified by the patient? Yes    Medication Accessibility:  Home Pharmacy: [facility] [address]   Was the patient provided with refills on discharged medications? Yes   Have all prescriptions been transferred from [facility] to home pharmacy? Yes   Is the patient able to afford medications? Has insurance    Medication Review:  CLOPIDOGREL (PLAVIX) Clopidogrel 75 mg once daily.   - Advised patient of medications to avoid (NSAIDs, ASA)  - Educated that Tylenol (acetaminophen) will be the preferred analgesic to prevent risk of bleeding  - Emphasized importance of monitoring for signs and symptoms of bleeding (abnormal bruising, prolonged bleeding, nose bleeds, bleeding from gums, discolored urine, black tarry stools)  - Advised patient to alert all providers of anticoagulation therapy prior to starting a new medication or having a procedure    Follow-up Appointments:  PCP Hospital f/u appt confirmed? Seen by PCP on 10/01/21  [facility] f/u appt confirmed? Patient has multiple follow up scheduled with rehab  If their condition worsens, is the pt aware to call PCP or go to the Emergency Dept.? Yes  Final Patient Assessment: Patient has had follow up and has refills at home pharmacy

## 2021-10-20 ENCOUNTER — Ambulatory Visit: Payer: 59 | Admitting: Physical Therapy

## 2021-10-20 ENCOUNTER — Encounter: Payer: Self-pay | Admitting: Occupational Therapy

## 2021-10-20 ENCOUNTER — Encounter: Payer: Self-pay | Admitting: Physical Therapy

## 2021-10-20 ENCOUNTER — Ambulatory Visit: Payer: 59 | Admitting: Occupational Therapy

## 2021-10-20 DIAGNOSIS — R278 Other lack of coordination: Secondary | ICD-10-CM

## 2021-10-20 DIAGNOSIS — R2681 Unsteadiness on feet: Secondary | ICD-10-CM | POA: Diagnosis not present

## 2021-10-20 DIAGNOSIS — R2689 Other abnormalities of gait and mobility: Secondary | ICD-10-CM

## 2021-10-20 DIAGNOSIS — M6281 Muscle weakness (generalized): Secondary | ICD-10-CM

## 2021-10-20 DIAGNOSIS — R42 Dizziness and giddiness: Secondary | ICD-10-CM

## 2021-10-20 NOTE — Therapy (Signed)
OUTPATIENT OCCUPATIONAL THERAPY NEURO TREATMENT  Patient Name: [name] MRN: [id number] DOB:Aug 13, 1960, 61 y.o., female Today's Date: 10/20/2021  PCP: [name]. [name] REFERRING PROVIDER: Myrene Buddy (Hospitalist) - Will send to PCP   OT End of Session - 10/20/21 0849     Visit Number 4    Number of Visits 9    Date for OT Re-Evaluation 11/28/21    Authorization Type [employer] - 30 VL OT/PT Combined    OT Start Time 0847    OT Stop Time 0930    OT Time Calculation (min) 43 min    Activity Tolerance Patient tolerated treatment well    Behavior During Therapy [facility] for tasks assessed/performed             Past Medical History:  Diagnosis Date   Abnormality of gait 05/10/2010   BACK PAIN 11/14/2008   Class 1 obesity due to excess calories with body mass index (BMI) of 31.0 to 31.9 in adult 02/07/2021   DIABETES MELLITUS, TYPE II 07/15/2008   Diplopia 07/15/2008   ECZEMA, ATOPIC 04/03/2009   GERD (gastroesophageal reflux disease)    Guillain-Barre ([place]) 1988   HYPERLIPIDEMIA 03/06/2009   HYPERTENSION 07/15/2008   Stroke ([place]) 2010, 2011   x2    Vertebral artery stenosis    Past Surgical History:  Procedure Laterality Date   ABDOMINAL HYSTERECTOMY     DILATION AND CURETTAGE OF UTERUS     FOOT SURGERY     IR ANGIO INTRA EXTRACRAN SEL COM CAROTID INNOMINATE UNI L MOD SED  09/17/2021   IR ANGIO INTRA EXTRACRAN SEL INTERNAL CAROTID UNI R MOD SED  09/17/2021   IR ANGIO VERTEBRAL SEL VERTEBRAL UNI R MOD SED  09/17/2021   IR US GUIDE VASC ACCESS RIGHT  09/17/2021   Patient Active Problem List   Diagnosis Date Noted   Hyperlipidemia LDL goal <70 10/01/2021   Stroke ([place]) 09/17/2021   Stage 3a chronic kidney disease (CKD) ([place]) 09/16/2021   Thyroid nodule 09/16/2021   Cervical lymphadenopathy 09/16/2021   Acute CVA (cerebrovascular accident) ([place]) 09/15/2021   GERD (gastroesophageal reflux disease) 04/30/2021   Peripheral arterial disease ([place]) 04/13/2021    Atherosclerosis of aorta ([place]) 02/14/2021   Cerebral thrombosis with cerebral infarction 02/08/2021   Abnormal MRI of head 02/07/2021   Vertigo 02/07/2021   Class 1 obesity due to excess calories with body mass index (BMI) of 31.0 to 31.9 in adult 02/07/2021   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   Trigger finger, left ring finger 10/04/2018   Alternating constipation and diarrhea 04/20/2018   Diabetic peripheral neuropathy associated with type 2 diabetes mellitus ([place]) 02/26/2018   DM (diabetes mellitus), type 2 with peripheral vascular complications ([place]) 25/08/3974   ABNORMALITY OF GAIT 05/10/2010   ECZEMA, ATOPIC 04/03/2009   Hyperlipidemia associated with type 2 diabetes mellitus ([place]) 03/06/2009   BACK PAIN 11/14/2008   Cerebrovascular disease 08/05/2008   DIPLOPIA 07/15/2008   Essential hypertension 07/15/2008    ONSET DATE: 09/15/21  REFERRING DIAG: Bilateral cerebellum strokes  THERAPY DIAG:  Unsteadiness on feet  Muscle weakness (generalized)  Other lack of coordination  Other abnormalities of gait and mobility  Dizziness and giddiness  Rationale for Evaluation and Treatment Rehabilitation  SUBJECTIVE:   SUBJECTIVE STATEMENT:   "Just trying to wake up" Pt reports having a headache and slight dizziness over the weekend.  PERTINENT HISTORY: HTN, DM- uncontrolled, obesity, lymphoma, previous strokes in 2010, 2011, and 2022  PRECAUTIONS: [place] No  PAIN:  Are you having pain? No  FALLS: Has patient fallen in last 6 months? No  LIVING ENVIRONMENT: Lives with: lives alone - family plans to move in with her Lives in: House/apartment Stairs: 3 stairs - external no railing Has following equipment at home: Single point cane, Environmental consultant - 4 wheeled, shower chair, and Shower bench  PLOF: Independent with basic ADLs and Needs assistance with homemaking  PATIENT GOALS improve balance, reduce dizziness, improve  vision  OBJECTIVE:   HAND DOMINANCE: Right    TODAY'S TREATMENT:  Pt navigated to the therapy gym from lobby with rollator with min cues for management of walker with sitting at table and keeping walker in front, locking brakes, etc.  Pt reports things are getting a little better with the rollator.   Navigated in and out of the kitchen with carrying laundry basket on rollator with min verbal cues. Pt simulated with opening refrigerator and obtaining bread with mod I and good safety awareness. Pt recalled locking brakes and placing rollator in appropriate location for opening refrigerator. Pt reports using cane at home in the kitchen mostly d/t space constraint.   Pt reports having tub bench and getting in and out of the shower with no difficulty. Pt completing with mod I, per report.   Hand Gripper: with LUE on level 2 with black spring. Pt picked up 1 inch blocks with gripper with min drops and  min difficulty.   Small Peg Board with LUE with one at a time for placing pegs into board. Pt removed with one at a time. Pt copied pattern with 90% accuracy. Pt req'd min cues and completed with mod difficulty and drops.    HOME EXERCISE PROGRAM: 10/05/21: coordination and putty HEP     GOALS: Goals reviewed with patient? Yes  SHORT TERM GOALS: Target date: 10/29/21  Patient will complete HEP designed to improve strength in  LUE, and coordination in BUE  Goal status: IN PROGRESS  2.  Patient will demonstrate improved awareness of movements which exacerbate her symptoms of dizziness.  Goal status: INITIAL  3.  Patient will demonstrate knowledge of compensatory strategies to shower safely with no more than supervision  Goal status: IN PROGRESS   LONG TERM GOALS: Target date: 11/28/21  Patient will complete updated HEP  Goal status: INITIAL  2.  Patient will report ability to complete light housekeeping for 15 minutes without excessive dizziness  Goal status: INITIAL  3.   Patient will report improved independence with simple meal preparation  Goal status: INITIAL  4.  Patient will demonstrate increased awareness of movements and activities in which she has diplopia.    Goal status: INITIAL   ASSESSMENT:  CLINICAL IMPRESSION: Pt reports much improved diplopia unless sugar is really high. Pt with increased progressing towards all goals. Continues to have difficulty with LUE coordination.  PERFORMANCE DEFICITS in functional skills including coordination, dexterity, sensation, tone, FMC, GMC, balance, endurance, decreased knowledge of use of DME, vision, and vestibular, cognitive skills including attention, memory, problem solving, and safety awareness, and psychosocial skills including habits.   IMPAIRMENTS are limiting patient from ADLs and IADLs.   COMORBIDITIES has co-morbidities such as HTN, DM  that affects occupational performance. Patient will benefit from skilled OT to address above impairments and improve overall function.  MODIFICATION OR ASSISTANCE TO COMPLETE EVALUATION: Min-Moderate modification of tasks or assist with assess necessary to complete an evaluation.  OT OCCUPATIONAL PROFILE AND HISTORY: Detailed assessment: Review of records and additional review of  physical, cognitive, psychosocial history related to current functional performance.  CLINICAL DECISION MAKING: Moderate - several treatment options, min-mod task modification necessary  REHAB POTENTIAL: Good  EVALUATION COMPLEXITY: Moderate    PLAN: OT FREQUENCY: 1x/week  OT DURATION: 8 weeks  PLANNED INTERVENTIONS: self care/ADL training, therapeutic exercise, therapeutic activity, neuromuscular re-education, manual therapy, balance training, functional mobility training, aquatic therapy, patient/family education, cognitive remediation/compensation, visual/perceptual remediation/compensation, and DME and/or AE instructions  RECOMMENDED OTHER SERVICES: PT eval completed  today  CONSULTED AND AGREED WITH PLAN OF CARE: Patient and family member/caregiver  PLAN FOR NEXT SESSION: rollator navigation, LUE coordination and grip strengthening   Zachery Conch, OT 10/20/2021, 8:50 AM

## 2021-10-20 NOTE — Therapy (Signed)
OUTPATIENT PHYSICAL THERAPY TREATMENT NOTE   Patient Name: Lynnita Somma MRN: [id number] DOB:06-02-1960, 61 y.o., female Today's Date: 10/20/2021  PCP: Betty [name], MD REFERRING PROVIDER: Betty [name], MD  END OF SESSION:   PT End of Session - 10/20/21 0931     Visit Number 4    Number of Visits 9    Date for PT Re-Evaluation 12/10/21    Authorization Type [place]    PT Start Time 0930    PT Stop Time 1010    PT Time Calculation (min) 40 min    Equipment Utilized During Treatment Gait belt    Activity Tolerance Patient tolerated treatment well    Behavior During Therapy WFL for tasks assessed/performed              Past Medical History:  Diagnosis Date   Abnormality of gait 05/10/2010   BACK PAIN 11/14/2008   Class 1 obesity due to excess calories with body mass index (BMI) of 31.0 to 31.9 in adult 02/07/2021   DIABETES MELLITUS, TYPE II 07/15/2008   Diplopia 07/15/2008   ECZEMA, ATOPIC 04/03/2009   GERD (gastroesophageal reflux disease)    Guillain-Barre ([place]) 1988   HYPERLIPIDEMIA 03/06/2009   HYPERTENSION 07/15/2008   Stroke ([place]) 2010, 2011   x2    Vertebral artery stenosis    Past Surgical History:  Procedure Laterality Date   ABDOMINAL HYSTERECTOMY     DILATION AND CURETTAGE OF UTERUS     FOOT SURGERY     IR ANGIO INTRA EXTRACRAN SEL COM CAROTID INNOMINATE UNI L MOD SED  09/17/2021   IR ANGIO INTRA EXTRACRAN SEL INTERNAL CAROTID UNI R MOD SED  09/17/2021   IR ANGIO VERTEBRAL SEL VERTEBRAL UNI R MOD SED  09/17/2021   IR US GUIDE VASC ACCESS RIGHT  09/17/2021   Patient Active Problem List   Diagnosis Date Noted   Hyperlipidemia LDL goal <70 10/01/2021   Stroke ([place]) 09/17/2021   Stage 3a chronic kidney disease (CKD) ([place]) 09/16/2021   Thyroid nodule 09/16/2021   Cervical lymphadenopathy 09/16/2021   Acute CVA (cerebrovascular accident) ([place]) 09/15/2021   GERD (gastroesophageal reflux disease) 04/30/2021   Peripheral arterial disease  ([place]) 04/13/2021   Atherosclerosis of aorta ([place]) 02/14/2021   Cerebral thrombosis with cerebral infarction 02/08/2021   Abnormal MRI of head 02/07/2021   Vertigo 02/07/2021   Class 1 obesity due to excess calories with body mass index (BMI) of 31.0 to 31.9 in adult 02/07/2021   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   Trigger finger, left ring finger 10/04/2018   Alternating constipation and diarrhea 04/20/2018   Diabetic peripheral neuropathy associated with type 2 diabetes mellitus ([place]) 02/26/2018   DM (diabetes mellitus), type 2 with peripheral vascular complications ([place]) 45/62/5638   ABNORMALITY OF GAIT 05/10/2010   ECZEMA, ATOPIC 04/03/2009   Hyperlipidemia associated with type 2 diabetes mellitus ([place]) 03/06/2009   BACK PAIN 11/14/2008   Cerebrovascular disease 08/05/2008   DIPLOPIA 07/15/2008   Essential hypertension 07/15/2008    REFERRING DIAG: I63.9 (ICD-10-CM) - Acute stroke due to ischemia (HCC) I63.9 (ICD-10-CM) - Acute CVA (cerebrovascular accident) ([place]) R42 (ICD-10-CM) - Vertigo   THERAPY DIAG:  Unsteadiness on feet  Muscle weakness (generalized)  Other lack of coordination  Other abnormalities of gait and mobility  Rationale for Evaluation and Treatment Rehabilitation  PERTINENT HISTORY:  Pt presented to ED on 09/15/2021 with unsteadiness of gait and dizziness starting around 12pm.  She was admitted for observation on 09/16/2021 and discharged on  09/17/2021. Will be followed by Frann Rider for neurology follow-up.  PRECAUTIONS: Fall  SUBJECTIVE: No falls.  HEP is fine.   PAIN:  Are you having pain? No  Treatment:  RAMP:  Level of Assistance: SBA Assistive device utilized: Single point cane Ramp Comments: x3; single cue to recover sequencing w/ cane on second attempt  CURB:  Level of Assistance: SBA Assistive device utilized: Single point cane Curb Comments: x3; min cues for step up with strong side and down with weak side, practiced 1  attempt of stepping up with LLE without issue noted  STAIRS:  Level of Assistance: SBA  Stair Negotiation Technique: Step to Pattern Forwards With use of AD: SPC  with Single Rail on Right  Number of Stairs: 16   Height of Stairs: 6"  Comments: Practiced using single rail with use of SPC switching hands during descent to simulate safety in a community environment.  Pt demonstrates no LOB w/ SPC use and maintains appropriate LE sequencing.  GAIT: Gait pattern: step through pattern and decreased stride length Distance walked: 345' [facility]) +100' (rollator) Assistive device utilized: Single point cane Level of assistance: Modified independence Comments: Pt exhibits no overt LOB and maintains good sequencing w/ SPC on right throughout.    230' using SPC alternating head nods/turns, mild dizziness reported following w/ recovery <52mn [facility] support holding x1 min > head nods > head turns > holding level x126m > vertical ball toss w/ eye tracking Backwards walking 4x8' w/ single UE support on countertop  PATIENT EDUCATION: Education details: Continue HEP.  DME instructions.  Continue using rollator in unfamiliar, unlevel, or long distances. Person educated: Patient and [name] Education method: Explanation Education comprehension: verbalized understanding     HOME EXERCISE PROGRAM: Access Code: [email or web address]: [email or web address] Date: 10/05/2021 Prepared by: EmCameron SprangExercises - Sit to Stand Without Arm Support  - 2 x daily - 7 x weekly - 1 sets - 10 reps - Tandem Walking with Counter Support  - 2 x daily - 7 x weekly - 1 sets - 3 reps - Walking March  - 2 x daily - 7 x weekly - 1 sets - 3 reps - Side Stepping with Resistance at Feet  - 1 x daily - 7 x weekly - 1 sets - 3 reps       GOALS: Goals reviewed with patient? Yes   SHORT TERM GOALS: Target date: 10/29/2021   Pt will be independent with initial strength and balance HEP  with supervision from family. Baseline:  To be established. Goal status: INITIAL   2.  Pt will decrease 5xSTS to <16 seconds in order to demonstrate decreased risk for falls and improved functional bilateral LE strength and power. Baseline: 20.91 seconds w/o UE support Goal status: INITIAL   3.  Pt will demonstrate a gait speed of >/= 1.2 m/sec in order to decrease risk for falls. Baseline: 1.14 m/sec Goal status: INITIAL   4.  BERG to be assessed with STG/LTG set as appropriate. Baseline: Not assessed on eval. Goal status: INITIAL     LONG TERM GOALS: Target date: 11/26/2021   Pt will be independent with finalized advanced strength and balance HEP to promote maintenance of gains at discharge. Baseline: To be established. Goal status: INITIAL   2.  Pt will decrease 5xSTS to <13 seconds in order to demonstrate decreased risk for falls and improved functional bilateral LE strength and power. Baseline: See STG. Goal status:  INITIAL   3.  Pt will demonstrate TUG of <13 seconds in order to decrease risk of falls and improve functional mobility using LRAD. Baseline: 15.05 seconds w/ rollator Goal status: INITIAL   4.  BERG to be assessed with STG/LTG set as appropriate. Baseline: To be updated. Goal status: INITIAL   5.  Pt will manage 3 stairs w/ supervision w/o a rail ascending and descending using step-to or reciprocal gait to promote safe independence with home setup. Baseline: 4 stairs step-to, unilateral rail Goal status: INITIAL     ASSESSMENT:   CLINICAL IMPRESSION: Focus of skilled session today primarily on gait training with the [place] on level surfaces, steps, and inclines with patient demonstrating good sequencing and balance.  She continues to have mild dizziness with dynamic balance or head movements, but demonstrates good recovery to baseline throughout session.  Will continue to assess and address impairments as able.       OBJECTIVE IMPAIRMENTS Abnormal gait,  decreased activity tolerance, decreased balance, decreased endurance, decreased knowledge of use of DME, decreased mobility, and decreased strength.    ACTIVITY LIMITATIONS carrying, lifting, squatting, stairs, and locomotion level   PARTICIPATION LIMITATIONS: community activity   PERSONAL FACTORS Age, Fitness, Past/current experiences, Time since onset of injury/illness/exacerbation, and 3+ comorbidities: DM2, hyperlipidemia, HTN, small cell lymphocytic impairment, GERD, prior CVA, vertigo-tried meclizine, lymphoma, PVD  are also affecting patient's functional outcome.    REHAB POTENTIAL: Fair see personal factors   CLINICAL DECISION MAKING: Evolving/moderate complexity   EVALUATION COMPLEXITY: Moderate   PLAN: PT FREQUENCY: 1x/week   PT DURATION: 8 weeks   PLANNED INTERVENTIONS: Therapeutic exercises, Therapeutic activity, Neuromuscular re-education, Balance training, Gait training, Patient/Family education, Joint mobilization, Stair training, Vestibular training, DME instructions, Biofeedback, Manual therapy, and Re-evaluation   PLAN FOR NEXT SESSION: Assess STGs!  [place] practice, ambulatory balance, walking cone taps w/ SPC vs rollator, static/dynamic balance w/ head movements, try SPC outside vs compliant surfaces  Elease Etienne, PT, DPT

## 2021-10-27 ENCOUNTER — Ambulatory Visit: Payer: 59 | Admitting: Occupational Therapy

## 2021-10-27 ENCOUNTER — Ambulatory Visit: Payer: 59 | Admitting: Physical Therapy

## 2021-10-28 ENCOUNTER — Encounter: Payer: Self-pay | Admitting: Physical Therapy

## 2021-10-28 ENCOUNTER — Ambulatory Visit: Payer: 59 | Admitting: Physical Therapy

## 2021-10-28 DIAGNOSIS — R2689 Other abnormalities of gait and mobility: Secondary | ICD-10-CM

## 2021-10-28 DIAGNOSIS — R2681 Unsteadiness on feet: Secondary | ICD-10-CM

## 2021-10-28 DIAGNOSIS — R278 Other lack of coordination: Secondary | ICD-10-CM

## 2021-10-28 DIAGNOSIS — M6281 Muscle weakness (generalized): Secondary | ICD-10-CM

## 2021-10-28 NOTE — Patient Instructions (Signed)
Access Code: [email or web address] URL: [email or web address] Date: 10/28/2021 Prepared by: Elease Etienne  Exercises - Sit to Stand Without Arm Support  - 2 x daily - 7 x weekly - 1 sets - 10 reps - Tandem Walking with Counter Support  - 2 x daily - 7 x weekly - 1 sets - 3 reps - Walking March  - 2 x daily - 7 x weekly - 1 sets - 3 reps - Side Stepping with Resistance at Feet  - 1 x daily - 7 x weekly - 1 sets - 3 reps - Standing Heel Raise with Support  - 1 x daily - 7 x weekly - 2 sets - 12 reps - Standing Single Leg Stance with Counter Support  - 1 x daily - 7 x weekly - 1 sets - 2 reps - 30 seconds hold

## 2021-10-28 NOTE — Therapy (Signed)
OUTPATIENT PHYSICAL THERAPY TREATMENT NOTE   Patient Name: [name] MRN: [id number] DOB:October 28, 1960, 61 y.o., female Today's Date: 10/29/2021  PCP: Betty [name], MD REFERRING PROVIDER: Betty [name], MD  END OF SESSION:   PT End of Session - 10/28/21 1022     Visit Number 5    Number of Visits 9    Date for PT Re-Evaluation 12/10/21    Authorization Type [place]    PT Start Time 1638    PT Stop Time 1059    PT Time Calculation (min) 40 min    Equipment Utilized During Treatment Gait belt    Activity Tolerance Patient tolerated treatment well    Behavior During Therapy WFL for tasks assessed/performed              Past Medical History:  Diagnosis Date   Abnormality of gait 05/10/2010   BACK PAIN 11/14/2008   Class 1 obesity due to excess calories with body mass index (BMI) of 31.0 to 31.9 in adult 02/07/2021   DIABETES MELLITUS, TYPE II 07/15/2008   Diplopia 07/15/2008   ECZEMA, ATOPIC 04/03/2009   GERD (gastroesophageal reflux disease)    Guillain-Barre ([place]) 1988   HYPERLIPIDEMIA 03/06/2009   HYPERTENSION 07/15/2008   Stroke ([place]) 2010, 2011   x2    Vertebral artery stenosis    Past Surgical History:  Procedure Laterality Date   ABDOMINAL HYSTERECTOMY     DILATION AND CURETTAGE OF UTERUS     FOOT SURGERY     IR ANGIO INTRA EXTRACRAN SEL COM CAROTID INNOMINATE UNI L MOD SED  09/17/2021   IR ANGIO INTRA EXTRACRAN SEL INTERNAL CAROTID UNI R MOD SED  09/17/2021   IR ANGIO VERTEBRAL SEL VERTEBRAL UNI R MOD SED  09/17/2021   IR US GUIDE VASC ACCESS RIGHT  09/17/2021   Patient Active Problem List   Diagnosis Date Noted   Hyperlipidemia LDL goal <70 10/01/2021   Stroke ([place]) 09/17/2021   Stage 3a chronic kidney disease (CKD) ([place]) 09/16/2021   Thyroid nodule 09/16/2021   Cervical lymphadenopathy 09/16/2021   Acute CVA (cerebrovascular accident) ([place]) 09/15/2021   GERD (gastroesophageal reflux disease) 04/30/2021   Peripheral arterial disease  ([place]) 04/13/2021   Atherosclerosis of aorta ([place]) 02/14/2021   Cerebral thrombosis with cerebral infarction 02/08/2021   Abnormal MRI of head 02/07/2021   Vertigo 02/07/2021   Class 1 obesity due to excess calories with body mass index (BMI) of 31.0 to 31.9 in adult 02/07/2021   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   Trigger finger, left ring finger 10/04/2018   Alternating constipation and diarrhea 04/20/2018   Diabetic peripheral neuropathy associated with type 2 diabetes mellitus ([place]) 02/26/2018   DM (diabetes mellitus), type 2 with peripheral vascular complications ([place]) 46/65/9935   ABNORMALITY OF GAIT 05/10/2010   ECZEMA, ATOPIC 04/03/2009   Hyperlipidemia associated with type 2 diabetes mellitus ([place]) 03/06/2009   BACK PAIN 11/14/2008   Cerebrovascular disease 08/05/2008   DIPLOPIA 07/15/2008   Essential hypertension 07/15/2008    REFERRING DIAG: I63.9 (ICD-10-CM) - Acute stroke due to ischemia (HCC) I63.9 (ICD-10-CM) - Acute CVA (cerebrovascular accident) ([place]) R42 (ICD-10-CM) - Vertigo   THERAPY DIAG:  Unsteadiness on feet  Muscle weakness (generalized)  Other lack of coordination  Other abnormalities of gait and mobility  Rationale for Evaluation and Treatment Rehabilitation  PERTINENT HISTORY:  Pt presented to ED on 09/15/2021 with unsteadiness of gait and dizziness starting around 12pm.  She was admitted for observation on 09/16/2021 and discharged on  09/17/2021. Will be followed by Frann Rider for neurology follow-up.  PRECAUTIONS: Fall  SUBJECTIVE: She denies falls and states everything is the same otherwise.   PAIN:  Are you having pain? No  Treatment:  Reviewed HEP w/o UE support: -tandem walking 2x8' -side stepping w/ resistance 4x8' -walking march x20 -STS x10 Added 10/28/2021: -heel raises x12 -single leg stance w/ UE support 2x30sec  Assessed BERG:  [facility] PT Assessment - 10/28/21 1036       Berg Balance Test   Sit to Stand Able to  stand without using hands and stabilize independently    Standing Unsupported Able to stand safely 2 minutes    Sitting with Back Unsupported but Feet Supported on Floor or Stool Able to sit safely and securely 2 minutes    Stand to Sit Sits safely with minimal use of hands    Transfers Able to transfer safely, minor use of hands    Standing Unsupported with Eyes Closed Able to stand 10 seconds safely    Standing Unsupported with Feet Together Able to place feet together independently and stand 1 minute safely    From Standing, Reach Forward with Outstretched Arm Can reach forward >12 cm safely (5")    From Standing Position, Pick up Object from Floor Able to pick up shoe safely and easily    From Standing Position, Turn to Look Behind Over each Shoulder Looks behind from both sides and weight shifts well    Turn 360 Degrees Able to turn 360 degrees safely in 4 seconds or less    Standing Unsupported, Alternately Place Feet on Step/Stool Able to stand independently and safely and complete 8 steps in 20 seconds    Standing Unsupported, One Foot in Front Able to take small step independently and hold 30 seconds    Standing on One Leg Tries to lift leg/unable to hold 3 seconds but remains standing independently    Total Score 50            Assessed 5xSTS:  17.03sec w/o UE support Assessed 10MWT w/ rollator:  9.81 sec = 1.02 m/sec OR 3.36 ft/sec Assessed 10MWT w/o rollator:  10.22 sec = 0.98 m/sec OR 3.23 ft/sec  PATIENT EDUCATION: Education details: Discussed walking program to continue working on gait speed.  Continue HEP.  DME instructions.  Continue using rollator in unfamiliar, unlevel, or long distances. Person educated: Patient and [name] Education method: Explanation Education comprehension: verbalized understanding     HOME EXERCISE PROGRAM: Access Code: [email or web address] URL: [email or web address] Date: 10/28/2021 Prepared by: Elease Etienne  Exercises -  Sit to Stand Without Arm Support  - 2 x daily - 7 x weekly - 1 sets - 10 reps - Tandem Walking with Counter Support  - 2 x daily - 7 x weekly - 1 sets - 3 reps - Walking March  - 2 x daily - 7 x weekly - 1 sets - 3 reps - Side Stepping with Resistance at Feet  - 1 x daily - 7 x weekly - 1 sets - 3 reps - Standing Heel Raise with Support  - 1 x daily - 7 x weekly - 2 sets - 12 reps - Standing Single Leg Stance with Counter Support  - 1 x daily - 7 x weekly - 1 sets - 2 reps - 30 seconds hold  Walking program:  walk every other day 5-10 mins w/ rollator, take standing or seated rest as needed and try to increase avg time per walk by  1-2 mins per week. -printed this for patient 10/28/2021   GOALS: Goals reviewed with patient? Yes   SHORT TERM GOALS: Target date: 10/29/2021   Pt will be independent with initial strength and balance HEP with supervision from family. Baseline:  Established and updated 10/28/2021 Goal status: MET   2.  Pt will decrease 5xSTS to <16 seconds in order to demonstrate decreased risk for falls and improved functional bilateral LE strength and power. Baseline: 20.91 seconds w/o UE support; 10/28/2021 17.03sec w/o UE support Goal status: PROGRESSING   3.  Pt will demonstrate a gait speed of >/= 1.2 m/sec in order to decrease risk for falls. Baseline: 1.14 m/sec; 10/28/2021 1.02 m/sec Goal status: NOT MET   4.  Pt will increase BERG balance score to >/=50/56 to demonstrate improved static balance. Baseline: 45/56 10/13/2021; 50/56 10/28/2021 Goal status: MET     LONG TERM GOALS: Target date: 11/26/2021   Pt will be independent with finalized advanced strength and balance HEP to promote maintenance of gains at discharge. Baseline: To be established. Goal status: INITIAL   2.  Pt will decrease 5xSTS to <13 seconds in order to demonstrate decreased risk for falls and improved functional bilateral LE strength and power. Baseline: See STG. Goal status: INITIAL   3.  Pt  will demonstrate TUG of <13 seconds in order to decrease risk of falls and improve functional mobility using LRAD. Baseline: 15.05 seconds w/ rollator Goal status: INITIAL   4.  Pt will increase BERG balance score to >/=54/56 to demonstrate improved static balance. Baseline: 45/56; 50/56 on 10/28/2021 Goal status: INITIAL   5.  Pt will manage 3 stairs w/ supervision w/o a rail ascending and descending using step-to or reciprocal gait to promote safe independence with home setup. Baseline: 4 stairs step-to, unilateral rail Goal status: INITIAL     ASSESSMENT:   CLINICAL IMPRESSION: Assessment of short term goals this session with pt meeting or partially meeting 3 of 4 goals.  She did not meet her gait speed goal demonstrating a gait speed of 1.02 vs an initial 1.14 ft/sec.  Discussed walking program to promote gait speed and endurance this visit.  Reviewed and modified HEP as pt was compliant to prior and required advancements to reflect current functional progress.  Her 5xSTS improved to 17.03 sec w/o UE support just shy of her goal.  Her BERG score improved 5 points to 50/56 indicating a lowered fall risk than on eval.  She is making good progress towards her long-term goals with skilled PT intervention.     OBJECTIVE IMPAIRMENTS Abnormal gait, decreased activity tolerance, decreased balance, decreased endurance, decreased knowledge of use of DME, decreased mobility, and decreased strength.    ACTIVITY LIMITATIONS carrying, lifting, squatting, stairs, and locomotion level   PARTICIPATION LIMITATIONS: community activity   PERSONAL FACTORS Age, Fitness, Past/current experiences, Time since onset of injury/illness/exacerbation, and 3+ comorbidities: DM2, hyperlipidemia, HTN, small cell lymphocytic impairment, GERD, prior CVA, vertigo-tried meclizine, lymphoma, PVD  are also affecting patient's functional outcome.    REHAB POTENTIAL: Fair see personal factors   CLINICAL DECISION MAKING:  Evolving/moderate complexity   EVALUATION COMPLEXITY: Moderate   PLAN: PT FREQUENCY: 1x/week   PT DURATION: 8 weeks   PLANNED INTERVENTIONS: Therapeutic exercises, Therapeutic activity, Neuromuscular re-education, Balance training, Gait training, Patient/Family education, Joint mobilization, Stair training, Vestibular training, DME instructions, Biofeedback, Manual therapy, and Re-evaluation   PLAN FOR NEXT SESSION: SPC practice, ambulatory balance, walking cone taps w/ SPC vs rollator, static/dynamic balance w/  head movements, try SPC outside vs compliant surfaces  Elease Etienne, PT, DPT

## 2021-11-01 ENCOUNTER — Telehealth: Payer: Self-pay | Admitting: Family Medicine

## 2021-11-01 NOTE — Telephone Encounter (Signed)
Pt dropped off Disability forms to be signed and faxed to number on form. Pt would like for Dr. to give her a call if Dr. is confused about forms.   Pt is aware that it will take between 5-7 business days for the forms to be completed.   Please advise.

## 2021-11-03 ENCOUNTER — Ambulatory Visit: Payer: 59 | Admitting: Physical Therapy

## 2021-11-03 ENCOUNTER — Ambulatory Visit: Payer: 59 | Admitting: Occupational Therapy

## 2021-11-03 NOTE — Telephone Encounter (Signed)
Forms filled out & on pcp's desk for signature.

## 2021-11-03 NOTE — Telephone Encounter (Signed)
Form received

## 2021-11-05 NOTE — Telephone Encounter (Signed)
Forms signed & faxed as requested. Copy sent to scan.

## 2021-11-10 ENCOUNTER — Ambulatory Visit: Payer: 59 | Attending: Family Medicine | Admitting: Physical Therapy

## 2021-11-10 ENCOUNTER — Encounter: Payer: Self-pay | Admitting: Physical Therapy

## 2021-11-10 ENCOUNTER — Ambulatory Visit: Payer: 59 | Admitting: Occupational Therapy

## 2021-11-10 ENCOUNTER — Encounter: Payer: Self-pay | Admitting: Occupational Therapy

## 2021-11-10 DIAGNOSIS — R2681 Unsteadiness on feet: Secondary | ICD-10-CM | POA: Diagnosis not present

## 2021-11-10 DIAGNOSIS — R2689 Other abnormalities of gait and mobility: Secondary | ICD-10-CM | POA: Diagnosis present

## 2021-11-10 DIAGNOSIS — R208 Other disturbances of skin sensation: Secondary | ICD-10-CM | POA: Insufficient documentation

## 2021-11-10 DIAGNOSIS — R278 Other lack of coordination: Secondary | ICD-10-CM

## 2021-11-10 DIAGNOSIS — M6281 Muscle weakness (generalized): Secondary | ICD-10-CM

## 2021-11-10 DIAGNOSIS — R42 Dizziness and giddiness: Secondary | ICD-10-CM | POA: Insufficient documentation

## 2021-11-10 NOTE — Therapy (Signed)
OUTPATIENT OCCUPATIONAL THERAPY NEURO TREATMENT  Patient Name: [name] MRN: [id number] DOB:1961/01/05, 61 y.o., female Today's Date: 11/10/2021  PCP: [name]. [name] REFERRING PROVIDER: Myrene Buddy (Hospitalist) - Will send to PCP   OT End of Session - 11/10/21 1019     Visit Number 5    Number of Visits 9    Date for OT Re-Evaluation 11/28/21    Authorization Type [employer] - 30 VL OT/PT Combined    OT Start Time 1016    OT Stop Time 1100    OT Time Calculation (min) 44 min    Activity Tolerance Patient tolerated treatment well    Behavior During Therapy [facility] for tasks assessed/performed             Past Medical History:  Diagnosis Date   Abnormality of gait 05/10/2010   BACK PAIN 11/14/2008   Class 1 obesity due to excess calories with body mass index (BMI) of 31.0 to 31.9 in adult 02/07/2021   DIABETES MELLITUS, TYPE II 07/15/2008   Diplopia 07/15/2008   ECZEMA, ATOPIC 04/03/2009   GERD (gastroesophageal reflux disease)    Guillain-Barre ([place]) 1988   HYPERLIPIDEMIA 03/06/2009   HYPERTENSION 07/15/2008   Stroke ([place]) 2010, 2011   x2    Vertebral artery stenosis    Past Surgical History:  Procedure Laterality Date   ABDOMINAL HYSTERECTOMY     DILATION AND CURETTAGE OF UTERUS     FOOT SURGERY     IR ANGIO INTRA EXTRACRAN SEL COM CAROTID INNOMINATE UNI L MOD SED  09/17/2021   IR ANGIO INTRA EXTRACRAN SEL INTERNAL CAROTID UNI R MOD SED  09/17/2021   IR ANGIO VERTEBRAL SEL VERTEBRAL UNI R MOD SED  09/17/2021   IR US GUIDE VASC ACCESS RIGHT  09/17/2021   Patient Active Problem List   Diagnosis Date Noted   Hyperlipidemia LDL goal <70 10/01/2021   Stroke ([place]) 09/17/2021   Stage 3a chronic kidney disease (CKD) ([place]) 09/16/2021   Thyroid nodule 09/16/2021   Cervical lymphadenopathy 09/16/2021   Acute CVA (cerebrovascular accident) ([place]) 09/15/2021   GERD (gastroesophageal reflux disease) 04/30/2021   Peripheral arterial disease ([place]) 04/13/2021    Atherosclerosis of aorta ([place]) 02/14/2021   Cerebral thrombosis with cerebral infarction 02/08/2021   Abnormal MRI of head 02/07/2021   Vertigo 02/07/2021   Class 1 obesity due to excess calories with body mass index (BMI) of 31.0 to 31.9 in adult 02/07/2021   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   Trigger finger, left ring finger 10/04/2018   Alternating constipation and diarrhea 04/20/2018   Diabetic peripheral neuropathy associated with type 2 diabetes mellitus ([place]) 02/26/2018   DM (diabetes mellitus), type 2 with peripheral vascular complications ([place]) 73/71/0626   ABNORMALITY OF GAIT 05/10/2010   ECZEMA, ATOPIC 04/03/2009   Hyperlipidemia associated with type 2 diabetes mellitus ([place]) 03/06/2009   BACK PAIN 11/14/2008   Cerebrovascular disease 08/05/2008   DIPLOPIA 07/15/2008   Essential hypertension 07/15/2008    ONSET DATE: 09/15/21  REFERRING DIAG: Bilateral cerebellum strokes  THERAPY DIAG:  Unsteadiness on feet  Muscle weakness (generalized)  Other lack of coordination  Dizziness and giddiness  Rationale for Evaluation and Treatment Rehabilitation  SUBJECTIVE:   SUBJECTIVE STATEMENT:   "These fingers don't help the other 2 out"  PERTINENT HISTORY: HTN, DM- uncontrolled, obesity, lymphoma, previous strokes in 2010, 2011, and 2022  PRECAUTIONS: Fall  WEIGHT BEARING RESTRICTIONS No  PAIN:  Are you having pain? No  FALLS: Has patient fallen in last  6 months? No  PATIENT GOALS improve balance, reduce dizziness, improve vision  OBJECTIVE:   HAND DOMINANCE: Right    TODAY'S TREATMENT:  11/10/21  Issued Red Theraputty for LUE - pt reports green was too tough/challenging for LUE at this time. Reviewed HEP for Theraputty. Reissued handout. Access Code: [email or web address] URL: [email or web address] Date: 11/10/2021 Prepared by: Waldo Laine  Exercises - Putty Squeezes  - 1 x daily - 7 x weekly - 3 sets - 10 reps - Rolling Putty  on Table  - 1 x daily - 7 x weekly - 3 sets - 10 reps - Thumb Opposition with Putty  - 1 x daily - 7 x weekly - 3 sets - 10 reps - Seated Finger MP Flexion with Putty  - 1 x daily - 7 x weekly - 3 sets - 10 reps  Grooved Pegs with LUE for increased coordination. Pt placed pegs with one at a time and removed with one at a time and in hand manipulation. Pt completed with mod difficulty and mod drops.   Hand Gripper: with LUE on level 2 with black spring. Pt picked up 1 inch blocks with gripper with min drops and  min difficulty.   Resistance Clothespins 1-8# with LUE for mid and high functional reaching and sustained pinch. Pt req'd min cues for movement pattern and maintaining good positioning of LUE with reach.     HOME EXERCISE PROGRAM: 10/05/21: coordination and putty HEP  11/10/21: issued red theraputty for LUE    GOALS: Goals reviewed with patient? Yes  SHORT TERM GOALS: Target date: 10/29/21  Patient will complete HEP designed to improve strength in  LUE, and coordination in BUE  Goal status: IN PROGRESS  2.  Patient will demonstrate improved awareness of movements which exacerbate her symptoms of dizziness.  Goal status: MET  3.  Patient will demonstrate knowledge of compensatory strategies to shower safely with no more than supervision  Goal status: MET   LONG TERM GOALS: Target date: 11/28/21  Patient will complete updated HEP  Goal status: IN PROGRESS  2.  Patient will report ability to complete light housekeeping for 15 minutes without excessive dizziness  Goal status: IN PROGRESS minimal dizziness reported - does dishes approx 10 minutes  3.  Patient will report improved independence with simple meal preparation  Goal status: IN PROGRESS reports people are cooking for her but she is obtaining stacks and beverages independently  4.  Patient will demonstrate increased awareness of movements and activities in which she has diplopia.    Goal status:  MET   ASSESSMENT:  CLINICAL IMPRESSION: Pt continues to experience deficits with LUE coordination and strength however reports much improvement with dizziness, diplopia an activity tolerance.  PERFORMANCE DEFICITS in functional skills including coordination, dexterity, sensation, tone, FMC, GMC, balance, endurance, decreased knowledge of use of DME, vision, and vestibular, cognitive skills including attention, memory, problem solving, and safety awareness, and psychosocial skills including habits.   IMPAIRMENTS are limiting patient from ADLs and IADLs.   COMORBIDITIES has co-morbidities such as HTN, DM  that affects occupational performance. Patient will benefit from skilled OT to address above impairments and improve overall function.  MODIFICATION OR ASSISTANCE TO COMPLETE EVALUATION: Min-Moderate modification of tasks or assist with assess necessary to complete an evaluation.  OT OCCUPATIONAL PROFILE AND HISTORY: Detailed assessment: Review of records and additional review of physical, cognitive, psychosocial history related to current functional performance.  CLINICAL DECISION MAKING: Moderate - several treatment options, min-mod task modification necessary  REHAB POTENTIAL: Good  EVALUATION COMPLEXITY: Moderate    PLAN: OT FREQUENCY: 1x/week  OT DURATION: 8 weeks  PLANNED INTERVENTIONS: self care/ADL training, therapeutic exercise, therapeutic activity, neuromuscular re-education, manual therapy, balance training, functional mobility training, aquatic therapy, patient/family education, cognitive remediation/compensation, visual/perceptual remediation/compensation, and DME and/or AE instructions  RECOMMENDED OTHER SERVICES: PT eval completed today  CONSULTED AND AGREED WITH PLAN OF CARE: Patient and family member/caregiver  PLAN FOR NEXT SESSION: LUE coordination and grip strengthening   Zachery Conch, OT 11/10/2021, 11:27 AM

## 2021-11-10 NOTE — Therapy (Signed)
OUTPATIENT PHYSICAL THERAPY TREATMENT NOTE   Patient Name: [name] MRN: [id number] DOB:13-Dec-1960, 61 y.o., female Today's Date: 11/10/2021  PCP: Betty [name], MD REFERRING PROVIDER: Betty [name], MD  END OF SESSION:   PT End of Session - 11/10/21 0933     Visit Number 6    Number of Visits 9    Date for PT Re-Evaluation 12/10/21    Authorization Type [place]    PT Start Time 0932    PT Stop Time 1013    PT Time Calculation (min) 41 min    Equipment Utilized During Treatment Gait belt    Activity Tolerance Patient tolerated treatment well    Behavior During Therapy WFL for tasks assessed/performed              Past Medical History:  Diagnosis Date   Abnormality of gait 05/10/2010   BACK PAIN 11/14/2008   Class 1 obesity due to excess calories with body mass index (BMI) of 31.0 to 31.9 in adult 02/07/2021   DIABETES MELLITUS, TYPE II 07/15/2008   Diplopia 07/15/2008   ECZEMA, ATOPIC 04/03/2009   GERD (gastroesophageal reflux disease)    Guillain-Barre ([place]) 1988   HYPERLIPIDEMIA 03/06/2009   HYPERTENSION 07/15/2008   Stroke ([place]) 2010, 2011   x2    Vertebral artery stenosis    Past Surgical History:  Procedure Laterality Date   ABDOMINAL HYSTERECTOMY     DILATION AND CURETTAGE OF UTERUS     FOOT SURGERY     IR ANGIO INTRA EXTRACRAN SEL COM CAROTID INNOMINATE UNI L MOD SED  09/17/2021   IR ANGIO INTRA EXTRACRAN SEL INTERNAL CAROTID UNI R MOD SED  09/17/2021   IR ANGIO VERTEBRAL SEL VERTEBRAL UNI R MOD SED  09/17/2021   IR US GUIDE VASC ACCESS RIGHT  09/17/2021   Patient Active Problem List   Diagnosis Date Noted   Hyperlipidemia LDL goal <70 10/01/2021   Stroke ([place]) 09/17/2021   Stage 3a chronic kidney disease (CKD) ([place]) 09/16/2021   Thyroid nodule 09/16/2021   Cervical lymphadenopathy 09/16/2021   Acute CVA (cerebrovascular accident) ([place]) 09/15/2021   GERD (gastroesophageal reflux disease) 04/30/2021   Peripheral arterial disease  ([place]) 04/13/2021   Atherosclerosis of aorta ([place]) 02/14/2021   Cerebral thrombosis with cerebral infarction 02/08/2021   Abnormal MRI of head 02/07/2021   Vertigo 02/07/2021   Class 1 obesity due to excess calories with body mass index (BMI) of 31.0 to 31.9 in adult 02/07/2021   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   Trigger finger, left ring finger 10/04/2018   Alternating constipation and diarrhea 04/20/2018   Diabetic peripheral neuropathy associated with type 2 diabetes mellitus ([place]) 02/26/2018   DM (diabetes mellitus), type 2 with peripheral vascular complications ([place]) 74/04/8785   ABNORMALITY OF GAIT 05/10/2010   ECZEMA, ATOPIC 04/03/2009   Hyperlipidemia associated with type 2 diabetes mellitus ([place]) 03/06/2009   BACK PAIN 11/14/2008   Cerebrovascular disease 08/05/2008   DIPLOPIA 07/15/2008   Essential hypertension 07/15/2008    REFERRING DIAG: I63.9 (ICD-10-CM) - Acute stroke due to ischemia (HCC) I63.9 (ICD-10-CM) - Acute CVA (cerebrovascular accident) ([place]) R42 (ICD-10-CM) - Vertigo   THERAPY DIAG:  Unsteadiness on feet  Muscle weakness (generalized)  Other abnormalities of gait and mobility  Rationale for Evaluation and Treatment Rehabilitation  PERTINENT HISTORY:  Pt presented to ED on 09/15/2021 with unsteadiness of gait and dizziness starting around 12pm.  She was admitted for observation on 09/16/2021 and discharged on 09/17/2021. Will be followed by  Frann Rider for neurology follow-up.  PRECAUTIONS: Fall  SUBJECTIVE: Patient states she tripped on something at home, but did not fall and has not had any other adverse events.  She states she got up out of the chair too fast and got caught on the leg, but was able to catch herself.    PAIN:  Are you having pain? No  Treatment:  RAMP:  Level of Assistance: Modified independence Assistive device utilized: Single point cane Ramp Comments: x2; pt demos slowing of gait speed with descent, but maintains  good gait pattern  CURB:  Level of Assistance: Modified independence Assistive device utilized: Single point cane Curb Comments: x2 level and inclined curb step outside  GAIT: Gait pattern: step through pattern, decreased arm swing- Left, and wide BOS Distance walked: 575' (level indoor surface) + 400' (paved sidewalk) + 100' (grass) Assistive device utilized: Single point cane Level of assistance: Modified independence and SBA Comments: Pt demonstrates gait pattern she uses with her SPC at home with demonstration of good pattern w/ use of cane on right.  During outdoor ambulation pt demonstrates dec step length with inc distance likely due to fatigue.  She requires min cues with obstacle approach transitioning over large ground cord from paved sidewalk to grass.  Discussed balance strategies when using cane in grass as pt requires SBA due to waivering x2 w/o direct LOB.  -Added corner balance to HEP.  See bolded below for details. -anterolateral cone taps w/ SPC 4x8' SBA  PATIENT EDUCATION: Education details: Discussed continuing to walk and using the cane as comfortable in familiar environments especially at home in place of rollator to build endurance.  Continue HEP.  Continue using rollator in unfamiliar, unlevel, or long distances using best judgement. Person educated: Patient  Education method: Explanation Education comprehension: verbalized understanding     HOME EXERCISE PROGRAM: Access Code: [email or web address] URL: [email or web address] Date: 10/28/2021 Prepared by: Elease Etienne  Exercises - Sit to Stand Without Arm Support  - 2 x daily - 7 x weekly - 1 sets - 10 reps - Tandem Walking with Counter Support  - 2 x daily - 7 x weekly - 1 sets - 3 reps - Walking March  - 2 x daily - 7 x weekly - 1 sets - 3 reps - Side Stepping with Resistance at Feet  - 1 x daily - 7 x weekly - 1 sets - 3 reps - Standing Heel Raise with Support  - 1 x daily - 7 x weekly - 2 sets - 12  reps - Standing Single Leg Stance with Counter Support  - 1 x daily - 7 x weekly - 1 sets - 2 reps - 30 seconds hold - Corner Balance Feet Together: Eyes Closed With Head Turns  - 1 x daily - 5 x weekly - 1 sets - 3 reps - 30 seconds hold - Standing Near Stance in [place] with Eyes Closed  - 1 x daily - 5 x weekly - 1 sets - 3 reps - 30 seconds hold Walking program:  walk every other day 5-10 mins w/ rollator, take standing or seated rest as needed and try to increase avg time per walk by 1-2 mins per week. -printed this for patient 10/28/2021   GOALS: Goals reviewed with patient? Yes   SHORT TERM GOALS: Target date: 10/29/2021   Pt will be independent with initial strength and balance HEP with supervision from family. Baseline:  Established and updated 10/28/2021 Goal status: MET   2.  Pt will decrease 5xSTS to <16 seconds in order to demonstrate decreased risk for falls and improved functional bilateral LE strength and power. Baseline: 20.91 seconds w/o UE support; 10/28/2021 17.03sec w/o UE support Goal status: PROGRESSING   3.  Pt will demonstrate a gait speed of >/= 1.2 m/sec in order to decrease risk for falls. Baseline: 1.14 m/sec; 10/28/2021 1.02 m/sec Goal status: NOT MET   4.  Pt will increase BERG balance score to >/=50/56 to demonstrate improved static balance. Baseline: 45/56 10/13/2021; 50/56 10/28/2021 Goal status: MET     LONG TERM GOALS: Target date: 11/26/2021   Pt will be independent with finalized advanced strength and balance HEP to promote maintenance of gains at discharge. Baseline: To be established. Goal status: INITIAL   2.  Pt will decrease 5xSTS to <13 seconds in order to demonstrate decreased risk for falls and improved functional bilateral LE strength and power. Baseline: See STG. Goal status: INITIAL   3.  Pt will demonstrate TUG of <13 seconds in order to decrease risk of falls and improve functional mobility using LRAD. Baseline: 15.05 seconds w/  rollator Goal status: INITIAL   4.  Pt will increase BERG balance score to >/=54/56 to demonstrate improved static balance. Baseline: 45/56; 50/56 on 10/28/2021 Goal status: INITIAL   5.  Pt will manage 3 stairs w/ supervision w/o a rail ascending and descending using step-to or reciprocal gait to promote safe independence with home setup. Baseline: 4 stairs step-to, unilateral rail Goal status: INITIAL     ASSESSMENT:   CLINICAL IMPRESSION: Emphasis of skilled PT session today on use of SPC on varying indoor and outdoor surfaces.  Pt demonstrates good gait pattern with use of SPC across level, unlevel, and compliant surfaces as well as when navigating curb step and has adequate safety awareness to use the cane in outdoor environments.  Discussed potential need for supervision to [employer] w/ family member when on very compliant surfaces including in tall grass as pt has some mild instability when ambulating through grass today.  Pt is making good progress towards long-term goals and is in agreement to assess LTGs next session and discharge.     OBJECTIVE IMPAIRMENTS Abnormal gait, decreased activity tolerance, decreased balance, decreased endurance, decreased knowledge of use of DME, decreased mobility, and decreased strength.    ACTIVITY LIMITATIONS carrying, lifting, squatting, stairs, and locomotion level   PARTICIPATION LIMITATIONS: community activity   PERSONAL FACTORS Age, Fitness, Past/current experiences, Time since onset of injury/illness/exacerbation, and 3+ comorbidities: DM2, hyperlipidemia, HTN, small cell lymphocytic impairment, GERD, prior CVA, vertigo-tried meclizine, lymphoma, PVD  are also affecting patient's functional outcome.    REHAB POTENTIAL: Fair see personal factors   CLINICAL DECISION MAKING: Evolving/moderate complexity   EVALUATION COMPLEXITY: Moderate   PLAN: PT FREQUENCY: 1x/week   PT DURATION: 8 weeks   PLANNED INTERVENTIONS: Therapeutic exercises,  Therapeutic activity, Neuromuscular re-education, Balance training, Gait training, Patient/Family education, Joint mobilization, Stair training, Vestibular training, DME instructions, Biofeedback, Manual therapy, and Re-evaluation   PLAN FOR NEXT SESSION: Assess LTGs-D/C!  Elease Etienne, PT, DPT

## 2021-11-11 NOTE — Telephone Encounter (Signed)
Pt is calling and per pt  she will have [name] refax the form. Pt states the date of her disability was incorrect it should of been 12-28-2020.

## 2021-11-11 NOTE — Telephone Encounter (Signed)
Pt called back and said she will bring the form instead

## 2021-11-12 NOTE — Telephone Encounter (Signed)
Noted  

## 2021-11-15 ENCOUNTER — Telehealth: Payer: Self-pay | Admitting: Family Medicine

## 2021-11-15 ENCOUNTER — Encounter: Payer: Self-pay | Admitting: Occupational Therapy

## 2021-11-15 ENCOUNTER — Ambulatory Visit: Payer: 59 | Admitting: Occupational Therapy

## 2021-11-15 DIAGNOSIS — R2681 Unsteadiness on feet: Secondary | ICD-10-CM

## 2021-11-15 DIAGNOSIS — R2689 Other abnormalities of gait and mobility: Secondary | ICD-10-CM

## 2021-11-15 DIAGNOSIS — R278 Other lack of coordination: Secondary | ICD-10-CM

## 2021-11-15 DIAGNOSIS — M6281 Muscle weakness (generalized): Secondary | ICD-10-CM

## 2021-11-15 NOTE — Therapy (Signed)
OUTPATIENT OCCUPATIONAL THERAPY NEURO TREATMENT  Patient Name: [name] MRN: [id number] DOB:[birth year], 61 y.o., female Today's Date: 11/15/2021  PCP: [name]. [name] REFERRING PROVIDER: Myrene Buddy (Hospitalist) - Will send to PCP   OT End of Session - 11/15/21 1109     Visit Number 6    Number of Visits 9    Date for OT Re-Evaluation 11/28/21    Authorization Type [employer] - 30 VL OT/PT Combined    OT Start Time 1107    OT Stop Time 1761    OT Time Calculation (min) 38 min    Activity Tolerance Patient tolerated treatment well    Behavior During Therapy [facility] for tasks assessed/performed             Past Medical History:  Diagnosis Date   Abnormality of gait 05/10/2010   BACK PAIN 11/14/2008   Class 1 obesity due to excess calories with body mass index (BMI) of 31.0 to 31.9 in adult 02/07/2021   DIABETES MELLITUS, TYPE II 07/15/2008   Diplopia 07/15/2008   ECZEMA, ATOPIC 04/03/2009   GERD (gastroesophageal reflux disease)    Guillain-Barre ([place]) 1988   HYPERLIPIDEMIA 03/06/2009   HYPERTENSION 07/15/2008   Stroke ([place]) 2010, 2011   x2    Vertebral artery stenosis    Past Surgical History:  Procedure Laterality Date   ABDOMINAL HYSTERECTOMY     DILATION AND CURETTAGE OF UTERUS     FOOT SURGERY     IR ANGIO INTRA EXTRACRAN SEL COM CAROTID INNOMINATE UNI L MOD SED  09/17/2021   IR ANGIO INTRA EXTRACRAN SEL INTERNAL CAROTID UNI R MOD SED  09/17/2021   IR ANGIO VERTEBRAL SEL VERTEBRAL UNI R MOD SED  09/17/2021   IR US GUIDE VASC ACCESS RIGHT  09/17/2021   Patient Active Problem List   Diagnosis Date Noted   Hyperlipidemia LDL goal <70 10/01/2021   Stroke ([place]) 09/17/2021   Stage 3a chronic kidney disease (CKD) ([place]) 09/16/2021   Thyroid nodule 09/16/2021   Cervical lymphadenopathy 09/16/2021   Acute CVA (cerebrovascular accident) ([place]) 09/15/2021   GERD (gastroesophageal reflux disease) 04/30/2021   Peripheral arterial disease ([place]) 04/13/2021    Atherosclerosis of aorta ([place]) 02/14/2021   Cerebral thrombosis with cerebral infarction 02/08/2021   Abnormal MRI of head 02/07/2021   Vertigo 02/07/2021   Class 1 obesity due to excess calories with body mass index (BMI) of 31.0 to 31.9 in adult 02/07/2021   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   Trigger finger, left ring finger 10/04/2018   Alternating constipation and diarrhea 04/20/2018   Diabetic peripheral neuropathy associated with type 2 diabetes mellitus ([place]) 02/26/2018   DM (diabetes mellitus), type 2 with peripheral vascular complications ([place]) 60/73/7106   ABNORMALITY OF GAIT 05/10/2010   ECZEMA, ATOPIC 04/03/2009   Hyperlipidemia associated with type 2 diabetes mellitus ([place]) 03/06/2009   BACK PAIN 11/14/2008   Cerebrovascular disease 08/05/2008   DIPLOPIA 07/15/2008   Essential hypertension 07/15/2008    ONSET DATE: 09/15/21  REFERRING DIAG: Bilateral cerebellum strokes  THERAPY DIAG:  Unsteadiness on feet  Muscle weakness (generalized)  Other lack of coordination  Other abnormalities of gait and mobility  Rationale for Evaluation and Treatment Rehabilitation  SUBJECTIVE:   SUBJECTIVE STATEMENT:   "I got a stress ball that I've been squeezing"  PERTINENT HISTORY: HTN, DM- uncontrolled, obesity, lymphoma, previous strokes in 2010, 2011, and 2022  PRECAUTIONS: Fall  WEIGHT BEARING RESTRICTIONS No  PAIN:  Are you having pain? No  FALLS: Has  patient fallen in last 6 months? No  PATIENT GOALS improve balance, reduce dizziness, improve vision  OBJECTIVE:   HAND DOMINANCE: Right    TODAY'S TREATMENT:  11/15/21  Small Peg Board with LUE with one at a time for placing pegs into board. Pt removed with in hand manipulation. Pt copied pattern with 100% accuracy. Completed with mod difficulty and drops.  Reports heating things up in the microwave and obtaining snacks and beverages.  Pt is going on vacation and said she will be taking rollator  with her. Verbalized and demonstrated safely sitting on rollator PRN and instructed on not sitting and having her sister push her. Pt verbalized understanding.    HOME EXERCISE PROGRAM: 10/05/21: coordination and putty HEP  11/10/21: issued red theraputty for LUE    GOALS: Goals reviewed with patient? Yes  SHORT TERM GOALS: Target date: 10/29/21  Patient will complete HEP designed to improve strength in  LUE, and coordination in BUE  Goal status: MET 2.  Patient will demonstrate improved awareness of movements which exacerbate her symptoms of dizziness.  Goal status: MET  3.  Patient will demonstrate knowledge of compensatory strategies to shower safely with no more than supervision  Goal status: MET   LONG TERM GOALS: Target date: 11/28/21  Patient will complete updated HEP  Goal status: IN PROGRESS  2.  Patient will report ability to complete light housekeeping for 15 minutes without excessive dizziness  Goal status: IN PROGRESS minimal dizziness reported - does dishes approx 10 minutes  3.  Patient will report improved independence with simple meal preparation  Goal status: IN PROGRESS reports people are cooking for her but she is obtaining snacks and beverages independently  4.  Patient will demonstrate increased awareness of movements and activities in which she has diplopia.    Goal status: MET   ASSESSMENT:  CLINICAL IMPRESSION: Pt continues to progress towards goals.   PERFORMANCE DEFICITS in functional skills including coordination, dexterity, sensation, tone, FMC, GMC, balance, endurance, decreased knowledge of use of DME, vision, and vestibular, cognitive skills including attention, memory, problem solving, and safety awareness, and psychosocial skills including habits.   IMPAIRMENTS are limiting patient from ADLs and IADLs.   COMORBIDITIES has co-morbidities such as HTN, DM  that affects occupational performance. Patient will benefit from skilled OT to  address above impairments and improve overall function.  MODIFICATION OR ASSISTANCE TO COMPLETE EVALUATION: Min-Moderate modification of tasks or assist with assess necessary to complete an evaluation.  OT OCCUPATIONAL PROFILE AND HISTORY: Detailed assessment: Review of records and additional review of physical, cognitive, psychosocial history related to current functional performance.  CLINICAL DECISION MAKING: Moderate - several treatment options, min-mod task modification necessary  REHAB POTENTIAL: Good  EVALUATION COMPLEXITY: Moderate    PLAN: OT FREQUENCY: 1x/week  OT DURATION: 8 weeks  PLANNED INTERVENTIONS: self care/ADL training, therapeutic exercise, therapeutic activity, neuromuscular re-education, manual therapy, balance training, functional mobility training, aquatic therapy, patient/family education, cognitive remediation/compensation, visual/perceptual remediation/compensation, and DME and/or AE instructions  RECOMMENDED OTHER SERVICES: PT eval completed today  CONSULTED AND AGREED WITH PLAN OF CARE: Patient and family member/caregiver  PLAN FOR NEXT SESSION: LUE coordination and grip strengthening, simple meal prep?, anticipate d/c next visit   Zachery Conch, OT 11/15/2021, 11:30 AM

## 2021-11-15 NOTE — Telephone Encounter (Signed)
Paperwork filled out & placed on pcp's desk to be signed.

## 2021-11-15 NOTE — Telephone Encounter (Signed)
Paperwork received.

## 2021-11-15 NOTE — Telephone Encounter (Signed)
Pt dropped off Disability forms for Dr. [name] to redo since there was some areas missing. Forms have been placed in folder. Judson Roch is aware of the situation.   FYI.

## 2021-11-16 ENCOUNTER — Other Ambulatory Visit: Payer: Self-pay | Admitting: Family Medicine

## 2021-11-16 ENCOUNTER — Other Ambulatory Visit: Payer: Self-pay | Admitting: Cardiovascular Disease

## 2021-11-16 DIAGNOSIS — I10 Essential (primary) hypertension: Secondary | ICD-10-CM

## 2021-11-16 NOTE — Telephone Encounter (Signed)
Forms filled out correctly, and all medical records faxed with paperwork. Copies sent to scan.

## 2021-11-17 ENCOUNTER — Encounter: Payer: 59 | Admitting: Occupational Therapy

## 2021-11-23 NOTE — Telephone Encounter (Signed)
I left [name] a voicemail to return my call. All forms have been filled out & faxed; not sure what else they need at this time.

## 2021-11-23 NOTE — Telephone Encounter (Signed)
I spoke with Butch Penny; form corrected & faxed over to her fax number with medical records.

## 2021-11-23 NOTE — Telephone Encounter (Signed)
Pt called stating she called the receiving entity and they stated they didn't receive a written statement about her condition. They can be reached at [facility] for clarification

## 2021-11-24 ENCOUNTER — Ambulatory Visit: Payer: 59 | Admitting: Occupational Therapy

## 2021-11-24 ENCOUNTER — Encounter: Payer: Self-pay | Admitting: Physical Therapy

## 2021-11-24 ENCOUNTER — Ambulatory Visit: Payer: 59 | Admitting: Physical Therapy

## 2021-11-24 DIAGNOSIS — R278 Other lack of coordination: Secondary | ICD-10-CM

## 2021-11-24 DIAGNOSIS — R208 Other disturbances of skin sensation: Secondary | ICD-10-CM

## 2021-11-24 DIAGNOSIS — R2689 Other abnormalities of gait and mobility: Secondary | ICD-10-CM

## 2021-11-24 DIAGNOSIS — M6281 Muscle weakness (generalized): Secondary | ICD-10-CM

## 2021-11-24 DIAGNOSIS — R2681 Unsteadiness on feet: Secondary | ICD-10-CM

## 2021-11-24 NOTE — Therapy (Signed)
OUTPATIENT OCCUPATIONAL THERAPY NEURO TREATMENT/Discharge Summary  Patient Name: [name] MRN: [id number] DOB:1961-01-16, 61 y.o., female Today's Date: 11/24/2021  PCP: [name]. [name] REFERRING PROVIDER: Myrene Buddy [facility]) - Will send to PCP     Current functional level related to goals / functional outcomes: Pt achieved all long and short term goals   Remaining deficits: Decreased strength, decreased coordination, decreased functional mobility, decreased balance   Education / Equipment: Pt was educated regarding HEP and safety for ADLS/IADLs..She verbalizes understanding.   Patient agrees to discharge. Patient goals were met. Patient is being discharged due to being pleased with the current functional level..      OT End of Session - 11/24/21 1046     Visit Number 7    Number of Visits 9    Date for OT Re-Evaluation 11/28/21    Authorization Type [employer] - 30 VL OT/PT Combined    OT Start Time 1017    OT Stop Time 1055    OT Time Calculation (min) 38 min    Activity Tolerance Patient tolerated treatment well    Behavior During Therapy WFL for tasks assessed/performed              Past Medical History:  Diagnosis Date   Abnormality of gait 05/10/2010   BACK PAIN 11/14/2008   Class 1 obesity due to excess calories with body mass index (BMI) of 31.0 to 31.9 in adult 02/07/2021   DIABETES MELLITUS, TYPE II 07/15/2008   Diplopia 07/15/2008   ECZEMA, ATOPIC 04/03/2009   GERD (gastroesophageal reflux disease)    Guillain-Barre ([place]) 1988   HYPERLIPIDEMIA 03/06/2009   HYPERTENSION 07/15/2008   Stroke ([place]) 2010, 2011   x2    Vertebral artery stenosis    Past Surgical History:  Procedure Laterality Date   ABDOMINAL HYSTERECTOMY     DILATION AND CURETTAGE OF UTERUS     FOOT SURGERY     IR ANGIO INTRA EXTRACRAN SEL COM CAROTID INNOMINATE UNI L MOD SED  09/17/2021   IR ANGIO INTRA EXTRACRAN SEL INTERNAL CAROTID UNI R MOD SED   09/17/2021   IR ANGIO VERTEBRAL SEL VERTEBRAL UNI R MOD SED  09/17/2021   IR US GUIDE VASC ACCESS RIGHT  09/17/2021   Patient Active Problem List   Diagnosis Date Noted   Hyperlipidemia LDL goal <70 10/01/2021   Stroke ([place]) 09/17/2021   Stage 3a chronic kidney disease (CKD) ([place]) 09/16/2021   Thyroid nodule 09/16/2021   Cervical lymphadenopathy 09/16/2021   Acute CVA (cerebrovascular accident) ([place]) 09/15/2021   GERD (gastroesophageal reflux disease) 04/30/2021   Peripheral arterial disease ([place]) 04/13/2021   Atherosclerosis of aorta ([place]) 02/14/2021   Cerebral thrombosis with cerebral infarction 02/08/2021   Abnormal MRI of head 02/07/2021   Vertigo 02/07/2021   Class 1 obesity due to excess calories with body mass index (BMI) of 31.0 to 31.9 in adult 02/07/2021   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   Trigger finger, left ring finger 10/04/2018   Alternating constipation and diarrhea 04/20/2018   Diabetic peripheral neuropathy associated with type 2 diabetes mellitus ([place]) 02/26/2018   DM (diabetes mellitus), type 2 with peripheral vascular complications ([place]) 73/53/2992   ABNORMALITY OF GAIT 05/10/2010   ECZEMA, ATOPIC 04/03/2009   Hyperlipidemia associated with type 2 diabetes mellitus ([place]) 03/06/2009   BACK PAIN 11/14/2008   Cerebrovascular disease 08/05/2008   DIPLOPIA 07/15/2008   Essential hypertension 07/15/2008    ONSET DATE: 09/15/21  REFERRING DIAG: Bilateral cerebellum strokes  THERAPY DIAG:  Muscle weakness (generalized)  Other lack of coordination  Other abnormalities of gait and mobility  Other disturbances of skin sensation  Rationale for Evaluation and Treatment Rehabilitation  SUBJECTIVE:   SUBJECTIVE STATEMENT:   "I got a stress ball that I've been squeezing"  PERTINENT HISTORY: HTN, DM- uncontrolled, obesity, lymphoma, previous strokes in 2010, 2011, and 2022  PRECAUTIONS: Fall  WEIGHT BEARING RESTRICTIONS No  PAIN:  Are you  having pain? No  FALLS: Has patient fallen in last 6 months? No  PATIENT GOALS improve balance, reduce dizziness, improve vision  OBJECTIVE:   HAND DOMINANCE: Right    TODAY'S TREATMENT:  11/24/21 -simple cooking task to scramble an egg , using rollator to gather items, pt cooked the egg and turned off the stove modified independently. Fine motor coordination activity to stack and manipulate coins to place in container with in hand manipulation, min v.c. Pt was verbally instructed in putty exercises, she forgot to bring her putty with her today. She verbalized understanding. Placing and removing graded clothespins 1-8# from antennae with LUE for sustained pinch, min v.c  Therapist checked progress towards goals. Pt agrees with plans for d/c.   HOME EXERCISE PROGRAM: 10/05/21: coordination and putty HEP  11/10/21: issued red theraputty for LUE    GOALS: Goals reviewed with patient? Yes  SHORT TERM GOALS: Target date: 10/29/21  Patient will complete HEP designed to improve strength in  LUE, and coordination in BUE  Goal status: MET 2.  Patient will demonstrate improved awareness of movements which exacerbate her symptoms of dizziness.  Goal status: MET  3.  Patient will demonstrate knowledge of compensatory strategies to shower safely with no more than supervision  Goal status: MET   LONG TERM GOALS: Target date: 11/28/21  Patient will complete updated HEP  Goal status: met for putty exercises.  2.  Patient will report ability to complete light housekeeping for 15 minutes without excessive dizziness  Goal status: MET, per pt report  3.  Patient will report improved independence with simple meal preparation  Goal status: met pt reports that she has been baking pork chops in the oven  4.  Patient will demonstrate increased awareness of movements and activities in which she has diplopia.    Goal status: MET   ASSESSMENT:  CLINICAL IMPRESSION: Pt  made  excellent overall progress. She met all long and short term goals.  PERFORMANCE DEFICITS in functional skills including coordination, dexterity, sensation, tone, FMC, GMC, balance, endurance, decreased knowledge of use of DME, vision, and vestibular, cognitive skills including attention, memory, problem solving, and safety awareness, and psychosocial skills including habits.   IMPAIRMENTS are limiting patient from ADLs and IADLs.   COMORBIDITIES has co-morbidities such as HTN, DM  that affects occupational performance. Patient will benefit from skilled OT to address above impairments and improve overall function.  MODIFICATION OR ASSISTANCE TO COMPLETE EVALUATION: Min-Moderate modification of tasks or assist with assess necessary to complete an evaluation.  OT OCCUPATIONAL PROFILE AND HISTORY: Detailed assessment: Review of records and additional review of physical, cognitive, psychosocial history related to current functional performance.  CLINICAL DECISION MAKING: Moderate - several treatment options, min-mod task modification necessary  REHAB POTENTIAL: Good  EVALUATION COMPLEXITY: Moderate    PLAN: OT FREQUENCY: 1x/week  OT DURATION: 8 weeks  PLANNED INTERVENTIONS: self care/ADL training, therapeutic exercise, therapeutic activity, neuromuscular re-education, manual therapy, balance training, functional mobility training, aquatic therapy, patient/family education, cognitive remediation/compensation, visual/perceptual remediation/compensation, and DME and/or AE instructions  RECOMMENDED OTHER SERVICES:  PT eval completed today  CONSULTED AND AGREED WITH PLAN OF CARE: Patient and family member/caregiver  PLAN FOR NEXT SESSION: d/c OT   Fareeha Evon, OT 11/24/2021, 11:34 AM

## 2021-11-24 NOTE — Therapy (Signed)
OUTPATIENT PHYSICAL THERAPY TREATMENT NOTE/DISCHARGE SUMMARY   Patient Name: [name] MRN: [id number] DOB:[birth year], 61 y.o., female Today's Date: 11/24/2021  PCP: Betty [name], MD REFERRING PROVIDER: Betty [name], MD  PHYSICAL THERAPY DISCHARGE SUMMARY  Visits from Start of Care: 7  Current functional level related to goals / functional outcomes: See clinical impression statement.   Remaining deficits: Use of rail with stair descent for safest management, rollator use for long distance and compliant surfaces   Education / Equipment: Discharge plan, remaining deficits, reviewed HEP and walking program.   Patient agrees to discharge. Patient goals were partially met. Patient is being discharged due to maximized rehab potential.    END OF SESSION:   PT End of Session - 11/24/21 0934     Visit Number 7    Number of Visits 9    Date for PT Re-Evaluation 12/10/21    Authorization Type [employer]    PT Start Time 0932    PT Stop Time 1000   all time not needed for goal assessment for d/c   PT Time Calculation (min) 28 min    Equipment Utilized During Treatment Gait belt    Activity Tolerance Patient tolerated treatment well    Behavior During Therapy WFL for tasks assessed/performed              Past Medical History:  Diagnosis Date   Abnormality of gait 05/10/2010   BACK PAIN 11/14/2008   Class 1 obesity due to excess calories with body mass index (BMI) of 31.0 to 31.9 in adult 02/07/2021   DIABETES MELLITUS, TYPE II 07/15/2008   Diplopia 07/15/2008   ECZEMA, ATOPIC 04/03/2009   GERD (gastroesophageal reflux disease)    Guillain-Barre ([place]) 1988   HYPERLIPIDEMIA 03/06/2009   HYPERTENSION 07/15/2008   Stroke ([place]) 2010, 2011   x2    Vertebral artery stenosis    Past Surgical History:  Procedure Laterality Date   ABDOMINAL HYSTERECTOMY     DILATION AND CURETTAGE OF UTERUS     FOOT SURGERY     IR ANGIO INTRA EXTRACRAN SEL COM CAROTID  INNOMINATE UNI L MOD SED  09/17/2021   IR ANGIO INTRA EXTRACRAN SEL INTERNAL CAROTID UNI R MOD SED  09/17/2021   IR ANGIO VERTEBRAL SEL VERTEBRAL UNI R MOD SED  09/17/2021   IR US GUIDE VASC ACCESS RIGHT  09/17/2021   Patient Active Problem List   Diagnosis Date Noted   Hyperlipidemia LDL goal <70 10/01/2021   Stroke ([place]) 09/17/2021   Stage 3a chronic kidney disease (CKD) ([place]) 09/16/2021   Thyroid nodule 09/16/2021   Cervical lymphadenopathy 09/16/2021   Acute CVA (cerebrovascular accident) ([place]) 09/15/2021   GERD (gastroesophageal reflux disease) 04/30/2021   Peripheral arterial disease ([place]) 04/13/2021   Atherosclerosis of aorta ([place]) 02/14/2021   Cerebral thrombosis with cerebral infarction 02/08/2021   Abnormal MRI of head 02/07/2021   Vertigo 02/07/2021   Class 1 obesity due to excess calories with body mass index (BMI) of 31.0 to 31.9 in adult 02/07/2021   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   Trigger finger, left ring finger 10/04/2018   Alternating constipation and diarrhea 04/20/2018   Diabetic peripheral neuropathy associated with type 2 diabetes mellitus ([place]) 02/26/2018   DM (diabetes mellitus), type 2 with peripheral vascular complications ([place]) 28/31/5176   ABNORMALITY OF GAIT 05/10/2010   ECZEMA, ATOPIC 04/03/2009   Hyperlipidemia associated with type 2 diabetes mellitus ([place]) 03/06/2009   BACK PAIN 11/14/2008   Cerebrovascular disease 08/05/2008  DIPLOPIA 07/15/2008   Essential hypertension 07/15/2008    REFERRING DIAG: I63.9 (ICD-10-CM) - Acute stroke due to ischemia (HCC) I63.9 (ICD-10-CM) - Acute CVA (cerebrovascular accident) ([place]) R42 (ICD-10-CM) - Vertigo   THERAPY DIAG:  Unsteadiness on feet  Muscle weakness (generalized)  Other lack of coordination  Other abnormalities of gait and mobility  Rationale for Evaluation and Treatment Rehabilitation  PERTINENT HISTORY:  Pt presented to ED on 09/15/2021 with unsteadiness of gait and dizziness  starting around 12pm.  She was admitted for observation on 09/16/2021 and discharged on 09/17/2021. Will be followed by Frann Rider for neurology follow-up.  PRECAUTIONS: Fall  SUBJECTIVE: Patient reports no recent changes or new issues to report.  She did fine on her recent trip to [place] to visit [employer] and a water park.  She denies falls.   PAIN:  Are you having pain? No  Treatment:  Assessed 5xSTS w/o UE support: (1st attempt) 16.68 sec w/ pt repeatedly touching back to back of chair (2nd attempt) 16.62 sec w/ pt sitting forward, pt pauses at top of each rep due to knee pain  Assessed TUG w/ rollator:  11.81 sec  Assessed BERG:  [facility] PT Assessment - 11/24/21 0946       Berg Balance Test   Sit to Stand Able to stand without using hands and stabilize independently    Standing Unsupported Able to stand safely 2 minutes    Sitting with Back Unsupported but Feet Supported on Floor or Stool Able to sit safely and securely 2 minutes    Stand to Sit Sits safely with minimal use of hands    Transfers Able to transfer safely, minor use of hands    Standing Unsupported with Eyes Closed Able to stand 10 seconds safely    Standing Unsupported with Feet Together Able to place feet together independently and stand 1 minute safely    From Standing, Reach Forward with Outstretched Arm Can reach confidently >25 cm (10")    From Standing Position, Pick up Object from Floor Able to pick up shoe safely and easily    From Standing Position, Turn to Look Behind Over each Shoulder Looks behind from both sides and weight shifts well    Turn 360 Degrees Able to turn 360 degrees safely one side only in 4 seconds or less   mildly dizzy turning to left   Standing Unsupported, Alternately Place Feet on Step/Stool Able to stand independently and safely and complete 8 steps in 20 seconds    Standing Unsupported, One Foot in Front Able to plae foot ahead of the other independently and hold 30 seconds     Standing on One Leg Able to lift leg independently and hold equal to or more than 3 seconds    Total Score 52             Assessed Stairs:  STAIRS:  Level of Assistance: SBA  Stair Negotiation Technique: Step to Pattern Alternating Pattern  Forwards with No Rails  Number of Stairs: 8   Height of Stairs: 6"  Comments: Pt needs cuing on second bout of stairs to improve control of forward momentum in order to safely use step-to descent.  She performs reciprocal ascending with adequate control and safety, but it is recommended for her to use rails when navigating multiple stairs in community as able.     PATIENT EDUCATION: Education details: D/c plan for today, reviewed HEP and walking program and answered questions as appropriate.  Use of rail  as available for stairs, using rollator for increased distances and compliant surfaces. Person educated: Patient  Education method: Explanation Education comprehension: verbalized understanding     HOME EXERCISE PROGRAM: Access Code: [email or web address] URL: [email or web address] Date: 10/28/2021 Prepared by: Elease Etienne  Exercises - Sit to Stand Without Arm Support  - 2 x daily - 7 x weekly - 1 sets - 10 reps - Tandem Walking with Counter Support  - 2 x daily - 7 x weekly - 1 sets - 3 reps - Walking March  - 2 x daily - 7 x weekly - 1 sets - 3 reps - Side Stepping with Resistance at Feet  - 1 x daily - 7 x weekly - 1 sets - 3 reps - Standing Heel Raise with Support  - 1 x daily - 7 x weekly - 2 sets - 12 reps - Standing Single Leg Stance with Counter Support  - 1 x daily - 7 x weekly - 1 sets - 2 reps - 30 seconds hold - Corner Balance Feet Together: Eyes Closed With Head Turns  - 1 x daily - 5 x weekly - 1 sets - 3 reps - 30 seconds hold - Standing Near Stance in No Name with Eyes Closed  - 1 x daily - 5 x weekly - 1 sets - 3 reps - 30 seconds hold Walking program:  walk every other day 5-10 mins w/ rollator, take standing or  seated rest as needed and try to increase avg time per walk by 1-2 mins per week. -printed this for patient 10/28/2021   GOALS: Goals reviewed with patient? Yes   SHORT TERM GOALS: Target date: 10/29/2021   Pt will be independent with initial strength and balance HEP with supervision from family. Baseline:  Established and updated 10/28/2021 Goal status: MET   2.  Pt will decrease 5xSTS to <16 seconds in order to demonstrate decreased risk for falls and improved functional bilateral LE strength and power. Baseline: 20.91 seconds w/o UE support; 10/28/2021 17.03sec w/o UE support Goal status: PROGRESSING   3.  Pt will demonstrate a gait speed of >/= 1.2 m/sec in order to decrease risk for falls. Baseline: 1.14 m/sec; 10/28/2021 1.02 m/sec Goal status: NOT MET   4.  Pt will increase BERG balance score to >/=50/56 to demonstrate improved static balance. Baseline: 45/56 10/13/2021; 50/56 10/28/2021 Goal status: MET     LONG TERM GOALS: Target date: 11/26/2021   Pt will be independent with finalized advanced strength and balance HEP to promote maintenance of gains at discharge. Baseline: Established and recently updated. Goal status: MET   2.  Pt will decrease 5xSTS to <13 seconds in order to demonstrate decreased risk for falls and improved functional bilateral LE strength and power. Baseline: 17.03sec w/o UE support; 11/24/2021 16.63 sec w/o UE support Goal status: NOT MET   3.  Pt will demonstrate TUG of <13 seconds in order to decrease risk of falls and improve functional mobility using LRAD. Baseline: 15.05 seconds w/ rollator; 11/24/2021 11.81 sec w/ rollator Goal status: MET   4.  Pt will increase BERG balance score to >/=54/56 to demonstrate improved static balance. Baseline: 45/56; 50/56 on 10/28/2021; 11/24/2021 52/56 Goal status: PARTIALLY MET   5.  Pt will manage 3 stairs w/ supervision w/o a rail ascending and descending using step-to or reciprocal gait to promote safe  independence with home setup. Baseline: 4 stairs step-to, unilateral rail; 11/24/2021 supervision 8 steps using varying step-to and reciprocal pattern Goal status: MET  ASSESSMENT:   CLINICAL IMPRESSION: Assessed LTGs this session with patient meeting or partially meeting 4 of 5 goals, limited in a single goal due to mild bilateral knee discomfort.  She is compliant to HEP and walking program and has returned to normal outings with her family without issue.  She made minimal improvement in 5xSTS from last assessment from 17.03 seconds to 16.63 sec w/o needing UE support.  She navigates stairs without railing at supervision level and remains safest with use of a rail in community environments due to intermittent difficulty controlling forward momentum on descent.  She scored a 52/56 on the BERG which is just shy of her goal level of 54/56 demonstrating significant improvement in fall risk from evaluation.  Her TUG significantly improved from 15.05 seconds with use of rollator to 11.81 sec with rollator.  At this time she has made adequate progress towards goals and is in agreement to and appropriate for discharge.     OBJECTIVE IMPAIRMENTS Abnormal gait, decreased activity tolerance, decreased balance, decreased endurance, decreased knowledge of use of DME, decreased mobility, and decreased strength.    ACTIVITY LIMITATIONS carrying, lifting, squatting, stairs, and locomotion level   PARTICIPATION LIMITATIONS: community activity   PERSONAL FACTORS Age, Fitness, Past/current experiences, Time since onset of injury/illness/exacerbation, and 3+ comorbidities: DM2, hyperlipidemia, HTN, small cell lymphocytic impairment, GERD, prior CVA, vertigo-tried meclizine, lymphoma, PVD  are also affecting patient's functional outcome.    REHAB POTENTIAL: Fair see personal factors   CLINICAL DECISION MAKING: Evolving/moderate complexity   EVALUATION COMPLEXITY: Moderate   PLAN: PT FREQUENCY: 1x/week    PT DURATION: 8 weeks   PLANNED INTERVENTIONS: Therapeutic exercises, Therapeutic activity, Neuromuscular re-education, Balance training, Gait training, Patient/Family education, Joint mobilization, Stair training, Vestibular training, DME instructions, Biofeedback, Manual therapy, and Re-evaluation   PLAN FOR NEXT SESSION: N/A  Elease Etienne, PT, DPT

## 2021-12-14 ENCOUNTER — Telehealth: Payer: Self-pay | Admitting: Family Medicine

## 2021-12-14 NOTE — Telephone Encounter (Signed)
Pt dropped off disability forms to be signed by Dr and faxed to [phone]. Wanting forms to be filled out ASAP. However, told pt it would take 3-5 business days for a turn around. Pt understood.   Please advise.

## 2021-12-15 ENCOUNTER — Other Ambulatory Visit (HOSPITAL_COMMUNITY): Payer: Self-pay

## 2021-12-15 NOTE — Telephone Encounter (Signed)
Form received

## 2021-12-15 NOTE — Telephone Encounter (Signed)
Form filled out & on pcp's desk to be signed.

## 2021-12-16 NOTE — Telephone Encounter (Signed)
Patient dropped off another form that goes with previous forms to be filled out by pcp. She needs all forms to be faxed to the number on the paperwork and the originals given back to her. Paperwork was placed in folder for completion, patient was made aware in previous phone call that pcp is out and patient is hoping another provider will fill out forms for her.      Please advise

## 2021-12-16 NOTE — Telephone Encounter (Signed)
Pt called in to ask if another provider could sign her form. The longer it isn't signed is the longer she does not get paid for disability.

## 2021-12-17 ENCOUNTER — Telehealth: Payer: Self-pay | Admitting: Family Medicine

## 2021-12-17 NOTE — Telephone Encounter (Signed)
Pt called back and said that she dropped off the papers on Monday, 12/13/21.   MD then called her back on Tuesday, 12/14/21  and told Pt that she would be out until 12/21/21.   Pt states she made it clear to MD that she needed them by Monday, 12/20/21, because she can lose her benefits.   Pt thought MD would have someone else take care of this for her.  Pt is now asking if another provider can sign those forms for her?  Pt does not want to lose her benefits.  Please advise.

## 2021-12-17 NOTE — Telephone Encounter (Signed)
Pt was calling to ask if the forms that were dropped off yesterday were signed/completed?  She would like to know if they will be ready by Monday?  Please advise... [phone]

## 2021-12-20 NOTE — Telephone Encounter (Signed)
Forms have been faxed & patient is aware that the originals are up front for her to pick up.

## 2022-01-04 ENCOUNTER — Ambulatory Visit: Payer: 59 | Admitting: Family Medicine

## 2022-01-04 NOTE — Progress Notes (Deleted)
HPI: [name] is a 61 y.o. female, who is here today for chronic disease management.  Last seen on 10/01/21.  *** Review of Systems Rest of ROS see pertinent positives and negatives in HPI.  Current Outpatient Medications on File Prior to Visit  Medication Sig Dispense Refill   amLODipine (NORVASC) 10 MG tablet Take 1 tablet by mouth once daily (Patient taking differently: Take 10 mg by mouth every morning.) 30 tablet 3   aspirin 325 MG tablet Take 1 tablet (325 mg total) by mouth daily. 30 tablet 3   atorvastatin (LIPITOR) 80 MG tablet Take 1 tablet (80 mg total) by mouth daily. 30 tablet 3   B Complex Vitamins (VITAMIN B COMPLEX) TABS Take 1 tablet by mouth every morning.     Blood Pressure Monitoring (BLOOD PRESSURE MONITOR AUTOMAT) DEVI 1 Device by Does not apply route daily. 1 Device 0   Cholecalciferol (VITAMIN D3 PO) Take 1 capsule by mouth every morning.     cilostazol (PLETAL) 50 MG tablet Take 1 tablet by mouth twice daily 180 tablet 0   clopidogrel (PLAVIX) 75 MG tablet Take 1 tablet (75 mg total) by mouth daily. 30 tablet 3   Continuous Blood Gluc Sensor (FREESTYLE LIBRE 2 SENSOR) MISC 1 Device by Does not apply route every 14 (fourteen) days. 6 each 3   ezetimibe (ZETIA) 10 MG tablet Take 1 tablet (10 mg total) by mouth daily. 30 tablet 3   FLUoxetine (PROZAC) 20 MG capsule Take 1 capsule by mouth once daily 90 capsule 2   glucose blood (CONTOUR NEXT TEST) test strip 1 each by Other route 2 (two) times daily. And lancets 2/day 200 each 3   glucose blood (ONETOUCH VERIO) test strip USE TO CHECK BLOOD SUGAR TWICE A DAY AND PRN 100 each 6   hydrALAZINE (APRESOLINE) 50 MG tablet TAKE 1 TABLET BY MOUTH THREE TIMES DAILY 90 tablet 2   insulin glargine (LANTUS) 100 UNIT/ML Solostar Pen Inject 16 Units into the skin daily. And pen needles 1/day 6 mL 3   lisinopril-hydrochlorothiazide (ZESTORETIC) 20-12.5 MG tablet Take 2 tablets by mouth once daily (Patient taking  differently: Take 2 tablets by mouth every morning.) 60 tablet 3   metoprolol succinate (TOPROL-XL) 50 MG 24 hr tablet TAKE 1 TABLET BY MOUTH ONCE DAILY WITH MEALS OR  IMMEDIATELY  FOLLOWING  A  MEAL (Patient taking differently: 50 mg every morning.) 30 tablet 3   ONE TOUCH LANCETS MISC USE TO CHECK BLOOD SUGAR TWICE A DAY AND PRN 100 each 6   pantoprazole (PROTONIX) 40 MG tablet Take 1 tablet (40 mg total) by mouth daily. (Patient taking differently: Take 40 mg by mouth every morning.) 30 tablet 3   No current facility-administered medications on file prior to visit.    Past Medical History:  Diagnosis Date   Abnormality of gait 05/10/2010   BACK PAIN 11/14/2008   Class 1 obesity due to excess calories with body mass index (BMI) of 31.0 to 31.9 in adult 02/07/2021   DIABETES MELLITUS, TYPE II 07/15/2008   Diplopia 07/15/2008   ECZEMA, ATOPIC 04/03/2009   GERD (gastroesophageal reflux disease)    Guillain-Barre ([place]) 1988   HYPERLIPIDEMIA 03/06/2009   HYPERTENSION 07/15/2008   Stroke ([place]) 2010, 2011   x2    Vertebral artery stenosis    No Known Allergies  Social History   Socioeconomic History   Marital status: Divorced    Spouse name: Not on file   Number  of children: 0   Years of education: 13   Highest education level: Not on file  Occupational History   Occupation: custodian at [employer]   Smoking status: Never   Smokeless tobacco: Never  Vaping Use   Vaping Use: Never used  Substance and Sexual Activity   Alcohol use: No   Drug use: No   Sexual activity: Not on file  Other Topics Concern   Not on file  Social History Narrative   Right Handed    Lives in a one story home    No caffeine   Social Determinants of Health   Financial Resource Strain: Not on file  Food Insecurity: Not on file  Transportation Needs: Not on file  Physical Activity: Not on file  Stress: Not on file  Social Connections: Not on file    There were no  vitals filed for this visit. There is no height or weight on file to calculate BMI.  Physical Exam  ASSESSMENT AND PLAN:  There are no diagnoses linked to this encounter.  No orders of the defined types were placed in this encounter.   No problem-specific Assessment & Plan notes found for this encounter.   No follow-ups on file.  [name]. [name], MD  [facility]. [place] office.

## 2022-01-12 ENCOUNTER — Telehealth: Payer: Self-pay | Admitting: Family Medicine

## 2022-01-12 NOTE — Telephone Encounter (Signed)
Form received & filled out. On pcp's desk for signature.

## 2022-01-12 NOTE — Telephone Encounter (Signed)
Form completed & faxed. Copy sent to scan. Original and copy placed up front for patient; she is aware.

## 2022-01-12 NOTE — Telephone Encounter (Signed)
Disability form to be filled out--placed in dr's folder.  Call [phone] and fax to number on form when complete.

## 2022-01-20 ENCOUNTER — Emergency Department (HOSPITAL_BASED_OUTPATIENT_CLINIC_OR_DEPARTMENT_OTHER)
Admission: EM | Admit: 2022-01-20 | Discharge: 2022-01-20 | Disposition: A | Discharge status: Home / Self Care | Payer: 59 | Attending: Emergency Medicine | Admitting: Emergency Medicine

## 2022-01-20 ENCOUNTER — Other Ambulatory Visit: Payer: Self-pay

## 2022-01-20 ENCOUNTER — Encounter (HOSPITAL_BASED_OUTPATIENT_CLINIC_OR_DEPARTMENT_OTHER): Payer: Self-pay

## 2022-01-20 DIAGNOSIS — Z79899 Other long term (current) drug therapy: Secondary | ICD-10-CM | POA: Insufficient documentation

## 2022-01-20 DIAGNOSIS — N183 Chronic kidney disease, stage 3 unspecified: Secondary | ICD-10-CM | POA: Insufficient documentation

## 2022-01-20 DIAGNOSIS — E1140 Type 2 diabetes mellitus with diabetic neuropathy, unspecified: Secondary | ICD-10-CM

## 2022-01-20 DIAGNOSIS — E1122 Type 2 diabetes mellitus with diabetic chronic kidney disease: Secondary | ICD-10-CM | POA: Insufficient documentation

## 2022-01-20 DIAGNOSIS — Z794 Long term (current) use of insulin: Secondary | ICD-10-CM | POA: Insufficient documentation

## 2022-01-20 DIAGNOSIS — Z7982 Long term (current) use of aspirin: Secondary | ICD-10-CM | POA: Insufficient documentation

## 2022-01-20 DIAGNOSIS — R739 Hyperglycemia, unspecified: Secondary | ICD-10-CM

## 2022-01-20 DIAGNOSIS — E1165 Type 2 diabetes mellitus with hyperglycemia: Secondary | ICD-10-CM | POA: Insufficient documentation

## 2022-01-20 DIAGNOSIS — Z7902 Long term (current) use of antithrombotics/antiplatelets: Secondary | ICD-10-CM | POA: Insufficient documentation

## 2022-01-20 DIAGNOSIS — I129 Hypertensive chronic kidney disease with stage 1 through stage 4 chronic kidney disease, or unspecified chronic kidney disease: Secondary | ICD-10-CM | POA: Insufficient documentation

## 2022-01-20 LAB — URINALYSIS, ROUTINE W REFLEX MICROSCOPIC
Bilirubin Urine: NEGATIVE
Glucose, UA: >1000 mg/dL — AB
Ketones, ur: NEGATIVE mg/dL
Nitrite: NEGATIVE
Protein, ur: >300 mg/dL — AB
Specific Gravity, Urine: 1.015 (ref 1.005–1.030)
WBC, UA: >50 WBC/hpf — ABNORMAL HIGH (ref 0–5)
pH: 6 (ref 5.0–8.0)

## 2022-01-20 LAB — I-STAT VENOUS BLOOD GAS, ED
Acid-Base Excess: 1 mmol/L (ref 0.0–2.0)
Bicarbonate: 26.5 mmol/L (ref 20.0–28.0)
Calcium, Ion: 1.28 mmol/L (ref 1.15–1.40)
HCT: 37 % (ref 36.0–46.0)
Hemoglobin: 12.6 g/dL (ref 12.0–15.0)
O2 Saturation: 56 %
Potassium: 4 mmol/L (ref 3.5–5.1)
Sodium: 133 mmol/L — ABNORMAL LOW (ref 135–145)
TCO2: 28 mmol/L (ref 22–32)
pCO2, Ven: 43.3 mmHg — ABNORMAL LOW (ref 44–60)
pH, Ven: 7.394 (ref 7.25–7.43)
pO2, Ven: 30 mmHg — CL (ref 32–45)

## 2022-01-20 LAB — BASIC METABOLIC PANEL
Anion gap: 10 (ref 5–15)
BUN: 22 mg/dL (ref 8–23)
CO2: 24 mmol/L (ref 22–32)
Calcium: 9.9 mg/dL (ref 8.9–10.3)
Chloride: 99 mmol/L (ref 98–111)
Creatinine, Ser: 1.13 mg/dL — ABNORMAL HIGH (ref 0.44–1.00)
GFR, Estimated: 55 mL/min — ABNORMAL LOW (ref >60)
Glucose, Bld: 390 mg/dL — ABNORMAL HIGH (ref 70–99)
Potassium: 4.1 mmol/L (ref 3.5–5.1)
Sodium: 133 mmol/L — ABNORMAL LOW (ref 135–145)

## 2022-01-20 LAB — CBC
HCT: 37.8 % (ref 36.0–46.0)
Hemoglobin: 12.6 g/dL (ref 12.0–15.0)
MCH: 25.3 pg — ABNORMAL LOW (ref 26.0–34.0)
MCHC: 33.3 g/dL (ref 30.0–36.0)
MCV: 75.9 fL — ABNORMAL LOW (ref 80.0–100.0)
Platelets: 261 10*3/uL (ref 150–400)
RBC: 4.98 MIL/uL (ref 3.87–5.11)
RDW: 13.5 % (ref 11.5–15.5)
WBC: 6.4 10*3/uL (ref 4.0–10.5)
nRBC: 0 % (ref 0.0–0.2)

## 2022-01-20 LAB — CBG MONITORING, ED
Glucose-Capillary: 394 mg/dL — ABNORMAL HIGH (ref 70–99)
Glucose-Capillary: 358 mg/dL — ABNORMAL HIGH (ref 70–99)
Glucose-Capillary: 247 mg/dL — ABNORMAL HIGH (ref 70–99)

## 2022-01-20 LAB — BETA-HYDROXYBUTYRIC ACID: Beta-Hydroxybutyric Acid: 0.21 mmol/L (ref 0.05–0.27)

## 2022-01-20 MED ORDER — INSULIN REGULAR HUMAN 100 UNIT/ML IJ SOLN
5.0000 [IU] | Freq: Once | INTRAMUSCULAR | Status: DC
  Filled 2022-01-20: qty 3

## 2022-01-20 MED ORDER — INSULIN ASPART PROT & ASPART (70-30 MIX) 100 UNIT/ML ~~LOC~~ SUSP
5.0000 [IU] | Freq: Once | SUBCUTANEOUS | Status: AC
Start: 2022-01-20 — End: 2022-01-20
  Administered 2022-01-20: 5 [IU] via SUBCUTANEOUS

## 2022-01-20 MED ORDER — LACTATED RINGERS IV BOLUS
1000.0000 mL | Freq: Once | INTRAVENOUS | Status: AC
Start: 2022-01-20 — End: 2022-01-20
  Administered 2022-01-20: 1000 mL via INTRAVENOUS

## 2022-01-20 MED ORDER — GABAPENTIN 300 MG PO CAPS: ORAL_CAPSULE | ORAL | 0 refills | Status: DC

## 2022-01-20 NOTE — ED Triage Notes (Signed)
Pt believes that her blood sugars have been affecting legs x a month. Pt states that she has been having numbness in bilateral lower legs. Pt states that her sugar this morning was 370.

## 2022-01-20 NOTE — ED Notes (Signed)
Pt's CBG result was 247. Informed Dr. Mayra Neer and Zenia Resides - RN.

## 2022-01-20 NOTE — ED Provider Notes (Signed)
[facility] EMERGENCY DEPT Provider Note   CSN: [id number] Arrival date & time: 01/20/22  0932     History  Chief Complaint  Patient presents with   Hyperglycemia   Leg Pain    [name] is a 61 y.o. female with hypertension, HLD, eczema, T2DM, history of CVA, PAD, CKD stage III presents with hyperglycemia, leg pain.   Patient reports bilateral lower extremity numbness/tingling that started about one month ago. Symmetric, no associated swelling. Sometimes feel hot or cold, sometimes feel like blisters on the bottom of her feet but there aren't any.  Patient reports that she did have her insulin regimen decreased after her stroke in July, from >100U per day now to <20U per day. She does not take oral glucose meds. Checks her sugar 3 times per day and states it runs from 120 to 350 mg/dL. Is starting a new insurance company that will take effect 10/1 so hasn't seen a new endocrinologist yet. Denies f/c, recent illnesses, chest pain shortness of breath nausea vomiting diarrhea abdominal pain.  Endorses.  Constipation.  Denies any urinary symptoms like dysuria, hematuria, urinary frequency.  HPI     Home Medications Prior to Admission medications   Medication Sig Start Date End Date Taking? Authorizing Provider  amLODipine (NORVASC) 10 MG tablet Take 1 tablet by mouth once daily Patient taking differently: Take 10 mg by mouth every morning. 09/01/21  Yes [name], [name], MD  aspirin 325 MG tablet Take 1 tablet (325 mg total) by mouth daily. 09/18/21  Yes Danford, Suann Larry, MD  atorvastatin (LIPITOR) 80 MG tablet Take 1 tablet (80 mg total) by mouth daily. 09/17/21 09/17/22 Yes Danford, Suann Larry, MD  B Complex Vitamins (VITAMIN B COMPLEX) TABS Take 1 tablet by mouth every morning.   Yes [provider]  Cholecalciferol (VITAMIN D3 PO) Take 1 capsule by mouth every morning.   Yes [provider]  cilostazol (PLETAL) 50 MG tablet Take 1 tablet by  mouth twice daily 11/17/21  Yes Lorretta Harp, MD  clopidogrel (PLAVIX) 75 MG tablet Take 1 tablet (75 mg total) by mouth daily. 09/18/21  Yes Danford, Suann Larry, MD  ezetimibe (ZETIA) 10 MG tablet Take 1 tablet (10 mg total) by mouth daily. 09/18/21  Yes Danford, Suann Larry, MD  FLUoxetine (PROZAC) 20 MG capsule Take 1 capsule by mouth once daily 11/16/21  Yes [name], [name], MD  gabapentin (NEURONTIN) 300 MG capsule Take 1 capsule (300 mg total) by mouth daily for 1 day, THEN 1 capsule (300 mg total) 2 (two) times daily for 1 day, THEN 1 capsule (300 mg total) 3 (three) times daily for 28 days. 01/20/22 02/19/22 Yes Audley Hose, MD  hydrALAZINE (APRESOLINE) 50 MG tablet TAKE 1 TABLET BY MOUTH THREE TIMES DAILY 11/16/21  Yes [name], [name], MD  insulin glargine (LANTUS) 100 UNIT/ML Solostar Pen Inject 16 Units into the skin daily. And pen needles 1/day 09/17/21  Yes Danford, Suann Larry, MD  lisinopril-hydrochlorothiazide (ZESTORETIC) 20-12.5 MG tablet Take 2 tablets by mouth once daily Patient taking differently: Take 2 tablets by mouth every morning. 09/01/21  Yes [name], [name], MD  metoprolol succinate (TOPROL-XL) 50 MG 24 hr tablet TAKE 1 TABLET BY MOUTH ONCE DAILY WITH MEALS OR  IMMEDIATELY  FOLLOWING  A  MEAL Patient taking differently: 50 mg every morning. 09/01/21  Yes [name], [name], MD  pantoprazole (PROTONIX) 40 MG tablet Take 1 tablet (40 mg total) by mouth daily. Patient taking  differently: Take 40 mg by mouth every morning. 08/30/21  Yes [name], [name], MD  Blood Pressure Monitoring (BLOOD PRESSURE MONITOR AUTOMAT) DEVI 1 Device by Does not apply route daily. 08/29/18   [name], [name], MD  Continuous Blood Gluc Sensor (FREESTYLE LIBRE 2 SENSOR) MISC 1 Device by Does not apply route every 14 (fourteen) days. 02/09/21   [name], MD  glucose blood (CONTOUR NEXT TEST) test strip 1 each by Other route 2 (two) times daily. And lancets 2/day 03/09/18   [name], MD   glucose blood [facility] VERIO) test strip USE TO CHECK BLOOD SUGAR TWICE A DAY AND PRN 07/29/15   [name], MD  ONE TOUCH LANCETS MISC USE TO CHECK BLOOD SUGAR TWICE A DAY AND PRN 07/29/15   [name], MD      Allergies    Patient has no known allergies.    Review of Systems   Review of Systems Review of systems negative for fevers chills.  A 10 point review of systems was performed and is negative unless otherwise reported in HPI.  Physical Exam Updated Vital Signs BP (!) 174/79 (BP Location: Left Arm)   Pulse (!) 52   Temp 98.8 F (37.1 C) (Oral)   Resp 16   Ht '5\' 4"'$  (1.626 m)   Wt 86.2 kg   SpO2 100%   BMI 32.61 kg/m  Physical Exam General: Normal appearing female, lying in bed.  HEENT: PERRLA, Sclera anicteric, MMM, trachea midline. Cardiology: RRR, no murmurs/rubs/gallops. BL radial and DP pulses equal bilaterally.  Resp: Normal respiratory rate and effort. CTAB, no wheezes, rhonchi, crackles.  Abd: Soft, non-tender, non-distended. No rebound tenderness or guarding.  GU: Deferred. MSK: No peripheral edema. FROM of BL LEs, no e/o trauma or wounds, no erythema/induration/fluctuance. Extremities without deformity or TTP. No cyanosis or clubbing. Skin: warm, dry. No rashes or lesions. Back: No CVA tenderness Neuro: A&Ox4, CNs II-XII grossly intact. MAEs.  Sensation to light touch decreased in bilateral feet up to midcalf. Psych: Normal mood and affect.   ED Results / Procedures / Treatments   Labs (all labs ordered are listed, but only abnormal results are displayed) Labs Reviewed  BASIC METABOLIC PANEL - Abnormal; Notable for the following components:      Result Value   Sodium 133 (*)    Glucose, Bld 390 (*)    Creatinine, Ser 1.13 (*)    GFR, Estimated 55 (*)    All other components within normal limits  CBC - Abnormal; Notable for the following components:   MCV 75.9 (*)    MCH 25.3 (*)    All other components within normal limits   URINALYSIS, ROUTINE W REFLEX MICROSCOPIC - Abnormal; Notable for the following components:   APPearance HAZY (*)    Glucose, UA >1,000 (*)    Hgb urine dipstick SMALL (*)    Protein, ur >300 (*)    Leukocytes,Ua LARGE (*)    WBC, UA >50 (*)    Bacteria, UA RARE (*)    Non Squamous Epithelial 0-5 (*)    All other components within normal limits  CBG MONITORING, ED - Abnormal; Notable for the following components:   Glucose-Capillary 394 (*)    All other components within normal limits  I-STAT VENOUS BLOOD GAS, ED - Abnormal; Notable for the following components:   pCO2, Ven 43.3 (*)    pO2, Ven 30 (*)    Sodium 133 (*)    All other components within  normal limits  CBG MONITORING, ED - Abnormal; Notable for the following components:   Glucose-Capillary 358 (*)    All other components within normal limits  CBG MONITORING, ED - Abnormal; Notable for the following components:   Glucose-Capillary 247 (*)    All other components within normal limits  BETA-HYDROXYBUTYRIC ACID    Procedures Procedures    Medications Ordered in ED Medications  lactated ringers bolus 1,000 mL ( Intravenous Stopped 01/20/22 1406)  insulin aspart protamine- aspart (NOVOLOG MIX 70/30) injection 5 Units (5 Units Subcutaneous Given 01/20/22 1508)    ED Course/ Medical Decision Making/ A&P                          Medical Decision Making Amount and/or Complexity of Data Reviewed Labs: ordered. Decision-making details documented in ED Course.  Risk OTC drugs. Prescription drug management.   Patient is overall very well-appearing and afebrile, hypertensive to 130 systolic.  For patient's leg pain/numbness/tingling, likely etiology is diabetic neuropathy, as patient's numbness tingling is bilateral and symmetric and has stocking pattern and patient's prior blood glucose control seems to be poor.  Patient states she has felt this before and associated with diabetic neuropathy.  Very low concern for  acute ICH or ischemic CVA given lack of other focal neurodeficits by complaint or by exam.  No wounds, erythema, induration to suggest cellulitis. Consider DKA/HHS but patient is asymptomatic except for her peripheral neuropathy. Will obtain labs to ensure no DKA, electrolyte abnormalities, renal injury.  Patient possibly hyperglycemic due to recent changes in insulin regimen in the last few months.  Will give LR and possibly insulin while she is here.   I have personally reviewed and interpreted all labs and imaging, which are listed below.  Clinical Course as of 01/22/22 1131  Thu Jan 20, 2022  1030 pCO2, Ven(!): 43.3 [HN]  1030 pH, Ven: 7.394 [HN]  1030 WBC: 6.4 [HN]  1030 Glucose-Capillary(!): 394 [HN]  1030 Urinalysis, Routine w reflex microscopic(!) Glucosuria, proteinuria, and UTI [HN]  1423 Glucose-Capillary(!): 358 Will give fluid and SQ insulin regular [HN]  1423 Beta-Hydroxybutyric Acid: 0.21 [HN]  1433 Ketones, ur: NEGATIVE [HN]  1557 Glucose-Capillary(!): 247 [HN]    Clinical Course User Index [HN] Audley Hose, MD   Patient without any symptoms of UTI, query diabetic nephropathy. Will not treat with abx at this time. Patient made aware of urinalysis findings and advised to follow-up with her primary care physician.  Also advised to follow-up with primary care physician about insulin regimen and oral glucose medications, diabetic neuropathy, possible diabetic nephropathy, and hypertension.  Discussed with patient her treatment options for diabetic neuropathy and will give patient gabapentin on a ramp up to 300 mg 3 times daily.  Patient states she has tried this medication before but it made her sleepy.  Gave patient prescription and advised her to try it but that she can discuss with her primary physician other treatment options if she notes she is too sedated on the gabapentin.  Gave patient fluids and insulin aspart 5 units and blood glucose is downtrending.  We will  discharge patient in stable condition with discharge instructions and return precautions.  Dispo DC         Final Clinical Impression(s) / ED Diagnoses Final diagnoses:  Hyperglycemia  Type 2 diabetes mellitus with diabetic neuropathy, with long-term current use of insulin ([place])    Rx / DC Orders ED Discharge Orders  Ordered    gabapentin (NEURONTIN) 300 MG capsule  Multiple Frequencies        01/20/22 1445             This note was created using dictation software, which may contain spelling or grammatical errors.    Audley Hose, MD 01/22/22 1135

## 2022-01-20 NOTE — Discharge Instructions (Addendum)
Thank you for coming to [facility] Emergency Department. You were seen for hyperglycemia and leg pain. We did an exam, labs, and imaging, and these showed high blood sugar with no signs of DKA. You were treated with fluids and subcutaneous insulin.  Your leg pain is likely a result of your peripheral neuropathy. For this we have prescribed gabapentin 300 mg on the first day, 300 mg twice per day on the second day, and 300 mg three times per day after that.   Please continue to check your sugars 3 times per day. Please follow up with your endocrinologist in the next 1-2 weeks to address your hyperglycemia and potentially change your insulin regimen. If you cannot get in with your endocrinologist, please follow up with your primary care provider within 1 week.  Do not hesitate to return to the ED or call 911 if you experience: -Worsening symptoms -Lightheadedness, passing out -Fevers/chills -Changes in mental status -Chest pain or shortness of breath -Glucose reads "high" or is <70 mg/dL -Anything else that concerns you

## 2022-01-28 ENCOUNTER — Telehealth: Payer: Self-pay | Admitting: Family Medicine

## 2022-01-28 NOTE — Telephone Encounter (Signed)
On pcp's desk for signature. 

## 2022-01-28 NOTE — Telephone Encounter (Signed)
Pt dropped off Short-term disability Benefits form to be signed by Dr. Abbott Pao request for original to be faxed and she will pick up a copy for herself. Call once form has been faxed.   Forms have been placed in red folder.   Please advise.

## 2022-01-28 NOTE — Telephone Encounter (Signed)
Forms received.  

## 2022-01-28 NOTE — Telephone Encounter (Signed)
Form faxed, copy sent to scan & copy placed up front for patient. Patient is aware.

## 2022-02-17 ENCOUNTER — Other Ambulatory Visit: Payer: Self-pay | Admitting: Family Medicine

## 2022-02-22 NOTE — Telephone Encounter (Signed)
She is on Cilostazol, prescribed by vascular. I see notes from neuro that recommended Plavix 75 mg daily. Both antiplatelet agents, also on med list Aspirin. I am not sure if she should be on all these medications, I do not feel comfortable refilling Plavix. She needs to reach to her neurologist, who recommended continue Plavix. She needs to ask is she is supposed to be on both, Plavix and Cilostazol. Thanks, BJ

## 2022-02-28 ENCOUNTER — Telehealth: Payer: Self-pay | Admitting: Family Medicine

## 2022-02-28 ENCOUNTER — Encounter: Payer: Self-pay | Admitting: Neurology

## 2022-02-28 NOTE — Telephone Encounter (Signed)
PT dropped off Short-Term Disability Benefits Forms for Dr. To sign and fax back to Benefits Coordinator fax [phone]  Pt would like copies after form has been signed and given back to pt for her records.   Please advise.

## 2022-02-28 NOTE — Telephone Encounter (Signed)
Paperwork received.

## 2022-02-28 NOTE — Telephone Encounter (Signed)
Paperwork on pcp's desk to be signed.

## 2022-03-02 NOTE — Telephone Encounter (Signed)
Paperwork faxed & copy sent to scan. Copy placed up front for patient. I called and left her a voicemail letting her know.

## 2022-03-03 ENCOUNTER — Other Ambulatory Visit (HOSPITAL_COMMUNITY): Payer: Self-pay

## 2022-03-10 ENCOUNTER — Other Ambulatory Visit: Payer: Self-pay

## 2022-03-10 DIAGNOSIS — C8300 Small cell B-cell lymphoma, unspecified site: Secondary | ICD-10-CM

## 2022-03-11 ENCOUNTER — Inpatient Hospital Stay: Payer: Commercial Managed Care - HMO | Attending: Hematology | Admitting: Hematology

## 2022-03-11 ENCOUNTER — Inpatient Hospital Stay: Payer: Commercial Managed Care - HMO

## 2022-03-11 ENCOUNTER — Other Ambulatory Visit: Payer: Self-pay

## 2022-03-11 VITALS — BP 190/86 | HR 74 | Temp 98.1°F | Resp 17 | Wt 182.0 lb

## 2022-03-11 DIAGNOSIS — C8514 Unspecified B-cell lymphoma, lymph nodes of axilla and upper limb: Secondary | ICD-10-CM | POA: Diagnosis not present

## 2022-03-11 DIAGNOSIS — D563 Thalassemia minor: Secondary | ICD-10-CM | POA: Diagnosis not present

## 2022-03-11 DIAGNOSIS — C8300 Small cell B-cell lymphoma, unspecified site: Secondary | ICD-10-CM

## 2022-03-11 LAB — CBC WITH DIFFERENTIAL (CANCER CENTER ONLY)
Abs Immature Granulocytes: 0.01 10*3/uL (ref 0.00–0.07)
Basophils Absolute: 0 10*3/uL (ref 0.0–0.1)
Basophils Relative: 1 %
Eosinophils Absolute: 0.2 10*3/uL (ref 0.0–0.5)
Eosinophils Relative: 3 %
HCT: 34.8 % — ABNORMAL LOW (ref 36.0–46.0)
Hemoglobin: 11.5 g/dL — ABNORMAL LOW (ref 12.0–15.0)
Immature Granulocytes: 0 %
Lymphocytes Relative: 46 %
Lymphs Abs: 2.5 10*3/uL (ref 0.7–4.0)
MCH: 25.6 pg — ABNORMAL LOW (ref 26.0–34.0)
MCHC: 33 g/dL (ref 30.0–36.0)
MCV: 77.5 fL — ABNORMAL LOW (ref 80.0–100.0)
Monocytes Absolute: 0.7 10*3/uL (ref 0.1–1.0)
Monocytes Relative: 12 %
Neutro Abs: 2.1 10*3/uL (ref 1.7–7.7)
Neutrophils Relative %: 38 %
Platelet Count: 256 10*3/uL (ref 150–400)
RBC: 4.49 MIL/uL (ref 3.87–5.11)
RDW: 14.4 % (ref 11.5–15.5)
WBC Count: 5.5 10*3/uL (ref 4.0–10.5)
nRBC: 0 % (ref 0.0–0.2)

## 2022-03-11 LAB — CMP (CANCER CENTER ONLY)
ALT: 19 U/L (ref 0–44)
AST: 20 U/L (ref 15–41)
Albumin: 3.2 g/dL — ABNORMAL LOW (ref 3.5–5.0)
Alkaline Phosphatase: 102 U/L (ref 38–126)
Anion gap: 5 (ref 5–15)
BUN: 21 mg/dL (ref 8–23)
CO2: 29 mmol/L (ref 22–32)
Calcium: 9.3 mg/dL (ref 8.9–10.3)
Chloride: 103 mmol/L (ref 98–111)
Creatinine: 1.47 mg/dL — ABNORMAL HIGH (ref 0.44–1.00)
GFR, Estimated: 40 mL/min — ABNORMAL LOW (ref >60)
Glucose, Bld: 303 mg/dL — ABNORMAL HIGH (ref 70–99)
Potassium: 4.1 mmol/L (ref 3.5–5.1)
Sodium: 137 mmol/L (ref 135–145)
Total Bilirubin: 0.4 mg/dL (ref 0.3–1.2)
Total Protein: 7.1 g/dL (ref 6.5–8.1)

## 2022-03-11 LAB — IRON AND IRON BINDING CAPACITY (CC-WL,HP ONLY)
Iron: 54 ug/dL (ref 28–170)
Saturation Ratios: 19 % (ref 10.4–31.8)
TIBC: 291 ug/dL (ref 250–450)
UIBC: 237 ug/dL (ref 148–442)

## 2022-03-11 LAB — FERRITIN: Ferritin: 270 ng/mL (ref 11–307)

## 2022-03-11 LAB — LACTATE DEHYDROGENASE: LDH: 203 U/L — ABNORMAL HIGH (ref 98–192)

## 2022-03-15 NOTE — Progress Notes (Signed)
HPI: [name] is a 61 y.o. female with medical hx significant for poorly controlled DM II, CKD III, HTN.HLD, small lymphocytic lymphoma, PAD, CVA, and dizziness here today for her routine physical.  Last CPE: 03/20/2019.  She admits to limited exercise, mainly walking, and acknowledges the need for improvement. She reports an improved diet, including salads, vegetables, fish, and baked chicken, but frequently indulges in fried chicken during events.  Immunization History  Administered Date(s) Administered   Influenza Split 05/13/2011   Influenza Whole 03/06/2009   Influenza,inj,Quad PF,6+ Mos 02/01/2013, 06/06/2014, 03/16/2017, 02/26/2018, 03/20/2019, 02/04/2020, 03/01/2021, 03/16/2022   PFIZER Comirnaty(Gray Top)Covid-19 Tri-Sucrose Vaccine 06/29/2019, 07/20/2019, 02/05/2020   Pneumococcal Polysaccharide-23 02/26/2018   Tdap 03/20/2019   Health Maintenance  Topic Date Due   COLONOSCOPY (Pts 45-57yr Insurance coverage will need to be confirmed)  03/05/2020   MAMMOGRAM  05/28/2021   COVID-19 Vaccine (4 - Pfizer risk series) 03/31/2022 (Originally 04/01/2020)   Zoster Vaccines- Shingrix (1 of 2) 05/06/2022 (Originally 05/13/1979)   OPHTHALMOLOGY EXAM  06/01/2022   HEMOGLOBIN A1C  09/14/2022   Diabetic kidney evaluation - GFR measurement  03/12/2023   Diabetic kidney evaluation - Urine ACR  03/17/2023   FOOT EXAM  03/17/2023   INFLUENZA VACCINE  Completed   Hepatitis C Screening  Completed   HIV Screening  Completed   HPV VACCINES  Aged Out   PAP SMEAR-Modifier  Discontinued   DM II: She has not seen endocrinologist since 03/2021. Last HgA1C 12.9 in 08/2021. On Lantus 22 U daily. She has not been consistent with following dietary recommendations. BS's have improved but some times she gets 400's depending of what she eats. + Numbness and tingling upper and lower extremities. Peripheral neuropathy on Gabapentin 300 mg daily.  Dizziness is now occasional, "every now and  then."  HTN on Amlodipine 10 mg daily,Hydralazine 50 mg tid,Lisinopril-HCTZ 0-12.5 mg 2 tabs daily, and Metoprolol succinate 50 gm daily. Lab Results  Component Value Date   CREATININE 1.47 (H) 03/11/2022   BUN 21 03/11/2022   NA 137 03/11/2022   K 4.1 03/11/2022   CL 103 03/11/2022   CO2 29 03/11/2022    HLD on Zetia 10 mg daily and Atorvastatin 80 mg daily. PAD: Follows with vascular. She is on Pletal 50 mg bid.  Review of Systems  Constitutional:  Positive for fatigue. Negative for activity change, appetite change and fever.  HENT:  Negative for hearing loss, mouth sores, sore throat and trouble swallowing.   Eyes:  Negative for redness and visual disturbance.  Respiratory:  Negative for cough, shortness of breath and wheezing.   Cardiovascular:  Negative for chest pain and leg swelling.  Gastrointestinal:  Negative for abdominal pain, nausea and vomiting.       No changes in bowel habits.  Endocrine: Negative for cold intolerance, heat intolerance, polydipsia, polyphagia and polyuria.  Genitourinary:  Negative for decreased urine volume, dysuria, hematuria, vaginal bleeding and vaginal discharge.  Musculoskeletal:  Positive for arthralgias and gait problem. Negative for myalgias.  Skin:  Negative for color change and rash.  Allergic/Immunologic: Negative for environmental allergies.  Neurological:  Positive for dizziness. Negative for seizures, syncope, weakness and headaches.  Hematological:  Negative for adenopathy. Does not bruise/bleed easily.  Psychiatric/Behavioral:  Negative for confusion. The patient is not nervous/anxious.   All other systems reviewed and are negative.  Current Outpatient Medications on File Prior to Visit  Medication Sig Dispense Refill   amLODipine (NORVASC) 10 MG tablet Take 1 tablet by  mouth once daily (Patient taking differently: Take 10 mg by mouth every morning.) 30 tablet 3   aspirin 325 MG tablet Take 1 tablet (325 mg total) by mouth daily.  30 tablet 3   atorvastatin (LIPITOR) 80 MG tablet Take 1 tablet (80 mg total) by mouth daily. 30 tablet 3   B Complex Vitamins (VITAMIN B COMPLEX) TABS Take 1 tablet by mouth every morning.     Blood Pressure Monitoring (BLOOD PRESSURE MONITOR AUTOMAT) DEVI 1 Device by Does not apply route daily. 1 Device 0   Cholecalciferol (VITAMIN D3 PO) Take 1 capsule by mouth every morning.     cilostazol (PLETAL) 50 MG tablet Take 1 tablet by mouth twice daily 180 tablet 0   clopidogrel (PLAVIX) 75 MG tablet Take 1 tablet (75 mg total) by mouth daily. 30 tablet 3   Continuous Blood Gluc Sensor (FREESTYLE LIBRE 2 SENSOR) MISC 1 Device by Does not apply route every 14 (fourteen) days. 6 each 3   ezetimibe (ZETIA) 10 MG tablet Take 1 tablet (10 mg total) by mouth daily. 30 tablet 3   FLUoxetine (PROZAC) 20 MG capsule Take 1 capsule by mouth once daily 90 capsule 2   glucose blood (CONTOUR NEXT TEST) test strip 1 each by Other route 2 (two) times daily. And lancets 2/day 200 each 3   glucose blood (ONETOUCH VERIO) test strip USE TO CHECK BLOOD SUGAR TWICE A DAY AND PRN 100 each 6   hydrALAZINE (APRESOLINE) 50 MG tablet TAKE 1 TABLET BY MOUTH THREE TIMES DAILY 90 tablet 2   lisinopril-hydrochlorothiazide (ZESTORETIC) 20-12.5 MG tablet Take 2 tablets by mouth once daily (Patient taking differently: Take 2 tablets by mouth every morning.) 60 tablet 3   metoprolol succinate (TOPROL-XL) 50 MG 24 hr tablet TAKE 1 TABLET BY MOUTH ONCE DAILY WITH MEALS OR  IMMEDIATELY  FOLLOWING  A  MEAL (Patient taking differently: 50 mg every morning.) 30 tablet 3   ONE TOUCH LANCETS MISC USE TO CHECK BLOOD SUGAR TWICE A DAY AND PRN 100 each 6   pantoprazole (PROTONIX) 40 MG tablet Take 1 tablet (40 mg total) by mouth daily. (Patient taking differently: Take 40 mg by mouth every morning.) 30 tablet 3   gabapentin (NEURONTIN) 300 MG capsule Take 1 capsule (300 mg total) by mouth daily for 1 day, THEN 1 capsule (300 mg total) 2 (two)  times daily for 1 day, THEN 1 capsule (300 mg total) 3 (three) times daily for 28 days. 87 capsule 0   No current facility-administered medications on file prior to visit.   Past Medical History:  Diagnosis Date   Abnormality of gait 05/10/2010   BACK PAIN 11/14/2008   Class 1 obesity due to excess calories with body mass index (BMI) of 31.0 to 31.9 in adult 02/07/2021   DIABETES MELLITUS, TYPE II 07/15/2008   Diplopia 07/15/2008   ECZEMA, ATOPIC 04/03/2009   GERD (gastroesophageal reflux disease)    Guillain-Barre ([place]) 1988   HYPERLIPIDEMIA 03/06/2009   HYPERTENSION 07/15/2008   Stroke ([place]) 2010, 2011   x2    Vertebral artery stenosis     Past Surgical History:  Procedure Laterality Date   ABDOMINAL HYSTERECTOMY     DILATION AND CURETTAGE OF UTERUS     FOOT SURGERY     IR ANGIO INTRA EXTRACRAN SEL COM CAROTID INNOMINATE UNI L MOD SED  09/17/2021   IR ANGIO INTRA EXTRACRAN SEL INTERNAL CAROTID UNI R MOD SED  09/17/2021   IR ANGIO VERTEBRAL  SEL VERTEBRAL UNI R MOD SED  09/17/2021   IR US GUIDE VASC ACCESS RIGHT  09/17/2021    No Known Allergies  Family History  Problem Relation Age of Onset   Diabetes Sister    Asthma Other    Stroke Other    Hypertension Other    Diabetes Mother    Stroke Mother    Cancer Father        pt states hae had some kind of stomach cancer, ? stomach or colon   Breast cancer Neg Hx     Social History   Socioeconomic History   Marital status: Divorced    Spouse name: Not on file   Number of children: 0   Years of education: 13   Highest education level: Not on file  Occupational History   Occupation: custodian at [employer]	[place]  Tobacco Use   Smoking status: Never   Smokeless tobacco: Never  Vaping Use   Vaping Use: Never used  Substance and Sexual Activity   Alcohol use: No   Drug use: No   Sexual activity: Not on file  Other Topics Concern   Not on file  Social History Narrative   Right Handed    Lives in a one story  home    No caffeine   Social Determinants of Health   Financial Resource Strain: Not on file  Food Insecurity: Not on file  Transportation Needs: Not on file  Physical Activity: Not on file  Stress: Not on file  Social Connections: Not on file   Vitals:   03/16/22 0942  BP: 126/80  Pulse: 61  Resp: 16  Temp: 98.8 F (37.1 C)  SpO2: 99%   Body mass index is 30.94 kg/m.  Wt Readings from Last 3 Encounters:  03/16/22 180 lb 4 oz (81.8 kg)  03/11/22 182 lb (82.6 kg)  01/20/22 190 lb (86.2 kg)   Physical Exam Vitals and nursing note reviewed.  Constitutional:      General: She is not in acute distress.    Appearance: She is well-developed.  HENT:     Head: Normocephalic and atraumatic.     Right Ear: Hearing, tympanic membrane, ear canal and external ear normal.     Left Ear: Hearing, tympanic membrane, ear canal and external ear normal.     Mouth/Throat:     Mouth: Mucous membranes are moist.     Dentition: Abnormal dentition.     Pharynx: Oropharynx is clear. Uvula midline.  Eyes:     Extraocular Movements: Extraocular movements intact.     Conjunctiva/sclera: Conjunctivae normal.     Pupils: Pupils are equal, round, and reactive to light.  Neck:     Trachea: No tracheal deviation.  Cardiovascular:     Rate and Rhythm: Normal rate and regular rhythm.     Heart sounds: No murmur heard.    Comments: DP pulses palpable. Pulmonary:     Effort: Pulmonary effort is normal. No respiratory distress.     Breath sounds: Normal breath sounds.  Abdominal:     Palpations: Abdomen is soft. There is no hepatomegaly or mass.     Tenderness: There is no abdominal tenderness.  Musculoskeletal:     Comments: No sign of synovitis appreciated.  Lymphadenopathy:     Cervical: No cervical adenopathy.  Skin:    General: Skin is warm.     Findings: No erythema or rash.  Neurological:     General: No focal deficit present.  Mental Status: She is alert and oriented to person,  place, and time.     Cranial Nerves: No cranial nerve deficit.     Deep Tendon Reflexes:     Reflex Scores:      Bicep reflexes are 2+ on the right side and 2+ on the left side.      Patellar reflexes are 2+ on the right side and 2+ on the left side.    Comments: Mildly unstable gait, not assisted.  Psychiatric:        Mood and Affect: Mood and affect normal.   ASSESSMENT AND PLAN: [name] was here today annual physical examination.  Orders Placed This Encounter  Procedures   Flu Vaccine QUAD 20moIM (Fluarix, Fluzone & Alfiuria Quad PF)   Microalbumin / creatinine urine ratio   Hemoglobin A1c   Lipid panel   Ambulatory referral to Gastroenterology    [name] seen today for annual exam.  Diagnoses and all orders for this visit: Lab Results  Component Value Date   HGBA1C >14.0 (H) 03/16/2022   Lab Results  Component Value Date   CHOL 181 03/16/2022   HDL 41.50 03/16/2022   LDLCALC 114 (H) 03/16/2022   LDLDIRECT 175.9 03/02/2012   TRIG 128.0 03/16/2022   CHOLHDL 4 03/16/2022   Lab Results  Component Value Date   MICROALBUR 223.0 (H) 03/16/2022   MICROALBUR 5.3 (H) 10/09/2019   Routine general medical examination at a health care facility Assessment & Plan: We discussed the importance of regular physical activity and healthy diet for prevention of chronic illness and/or complications. Preventive guidelines reviewed. Overdue for mammogram, instructed to call and arrange appt. S/P hysterectomy due to fibroids. GI referral placed. Vaccination: Needs Shingrix, would like to call her health insurance before she gets it. Ca++ and vit D supplementation recommended. CPE in a year.   DM (diabetes mellitus), type 2 with peripheral vascular complications ([facility] Assessment & Plan: Problem has been poorly controlled. We discussed possible complications of elevated glucose. No changes in Lantus dose. Stressed the importance of following with her  endocrinologist, appt in 06/2022.  Orders: -     Microalbumin / creatinine urine ratio; Future -     Hemoglobin A1c; Future -     Insulin Glargine; Inject 22 Units into the skin daily. And pen needles 1/day  Dispense: 6 mL; Refill: 3  Hyperlipidemia LDL goal <70 Assessment & Plan: Continue Zetia 10 mg and Atorvastatin 80 mg daily. Low fat diet also recommended. Further recommendations will be given according to FLP results.  Orders: -     Lipid panel; Future  Essential hypertension Assessment & Plan: SBP mildly elevated today, reporting most BP's at home <= 140/80. Continue Amlodipine 10 mg daily, Lisinopril-HCTZ 20-12.5 mg 2 tabs daily, Hydralazine 50 mg tid,and Metoprolol succinate 50 mg daily. Low salt diet recommended. Continue monitoring BP regularly.   Colon cancer screening -     Ambulatory referral to Gastroenterology  Need for influenza vaccination -     Flu Vaccine QUAD 656moM (Fluarix, Fluzone & Alfiuria Quad PF)   Return in about 6 months (around 09/14/2022) for chronic problems.  Mackenze Grandison G. JoMartiniqueMD  [facility].

## 2022-03-16 ENCOUNTER — Encounter: Payer: Self-pay | Admitting: Family Medicine

## 2022-03-16 ENCOUNTER — Other Ambulatory Visit: Payer: Commercial Managed Care - HMO

## 2022-03-16 ENCOUNTER — Ambulatory Visit (INDEPENDENT_AMBULATORY_CARE_PROVIDER_SITE_OTHER): Payer: Commercial Managed Care - HMO | Admitting: Family Medicine

## 2022-03-16 VITALS — BP 126/80 | HR 61 | Temp 98.8°F | Resp 16 | Ht 64.0 in | Wt 180.2 lb

## 2022-03-16 DIAGNOSIS — I679 Cerebrovascular disease, unspecified: Secondary | ICD-10-CM

## 2022-03-16 DIAGNOSIS — E1151 Type 2 diabetes mellitus with diabetic peripheral angiopathy without gangrene: Secondary | ICD-10-CM

## 2022-03-16 DIAGNOSIS — Z0000 Encounter for general adult medical examination without abnormal findings: Secondary | ICD-10-CM | POA: Diagnosis not present

## 2022-03-16 DIAGNOSIS — Z1211 Encounter for screening for malignant neoplasm of colon: Secondary | ICD-10-CM | POA: Diagnosis not present

## 2022-03-16 DIAGNOSIS — I10 Essential (primary) hypertension: Secondary | ICD-10-CM | POA: Diagnosis not present

## 2022-03-16 DIAGNOSIS — E785 Hyperlipidemia, unspecified: Secondary | ICD-10-CM

## 2022-03-16 DIAGNOSIS — Z23 Encounter for immunization: Secondary | ICD-10-CM

## 2022-03-16 DIAGNOSIS — Z7902 Long term (current) use of antithrombotics/antiplatelets: Secondary | ICD-10-CM | POA: Insufficient documentation

## 2022-03-16 LAB — LIPID PANEL
Cholesterol: 181 mg/dL (ref 0–200)
HDL: 41.5 mg/dL (ref >39.00)
LDL Cholesterol: 114 mg/dL — ABNORMAL HIGH (ref 0–99)
NonHDL: 139.62
Total CHOL/HDL Ratio: 4
Triglycerides: 128 mg/dL (ref 0.0–149.0)
VLDL: 25.6 mg/dL (ref 0.0–40.0)

## 2022-03-16 LAB — MICROALBUMIN / CREATININE URINE RATIO
Creatinine,U: 52.1 mg/dL
Microalb Creat Ratio: 428.1 mg/g — ABNORMAL HIGH (ref 0.0–30.0)
Microalb, Ur: 223 mg/dL — ABNORMAL HIGH (ref 0.0–1.9)

## 2022-03-16 MED ORDER — INSULIN GLARGINE 100 UNIT/ML SOLOSTAR PEN: 22.0000 [IU] | PEN_INJECTOR | Freq: Every day | SUBCUTANEOUS | 3 refills | Status: DC

## 2022-03-16 NOTE — Patient Instructions (Addendum)
A few things to remember from today's visit:  Routine general medical examination at a health care facility  DM (diabetes mellitus), type 2 with peripheral vascular complications ([place]) - Plan: Microalbumin / creatinine urine ratio, Hemoglobin A1c, insulin glargine (LANTUS) 100 UNIT/ML Solostar Pen  Hyperlipidemia LDL goal <70 - Plan: Lipid panel  Essential hypertension  Cerebrovascular disease  Colon cancer screening - Plan: Ambulatory referral to Gastroenterology  Be sure you keep your appointments with all providers you follow with. Please call if you are not tolerating new medications. Appt with gastro will be arranged for your colonoscopy. You need your shingles vaccine. Please arrange your mammogram.  If you need refills for medications you take chronically, please call your pharmacy. Do not use My Chart to request refills or for acute issues that need immediate attention. If you send a my chart message, it may take a few days to be addressed, specially if I am not in the office.  Please be sure medication list is accurate. If a new problem present, please set up appointment sooner than planned today.  Health Maintenance, Female Adopting a healthy lifestyle and getting preventive care are important in promoting health and wellness. Ask your health care provider about: The right schedule for you to have regular tests and exams. Things you can do on your own to prevent diseases and keep yourself healthy. What should I know about diet, weight, and exercise? Eat a healthy diet  Eat a diet that includes plenty of vegetables, fruits, low-fat dairy products, and lean protein. Do not eat a lot of foods that are high in solid fats, added sugars, or sodium. Maintain a healthy weight Body mass index (BMI) is used to identify weight problems. It estimates body fat based on height and weight. Your health care provider can help determine your BMI and help you achieve or maintain a healthy  weight. Get regular exercise Get regular exercise. This is one of the most important things you can do for your health. Most adults should: Exercise for at least 150 minutes each week. The exercise should increase your heart rate and make you sweat (moderate-intensity exercise). Do strengthening exercises at least twice a week. This is in addition to the moderate-intensity exercise. Spend less time sitting. Even light physical activity can be beneficial. Watch cholesterol and blood lipids Have your blood tested for lipids and cholesterol at 61 years of age, then have this test every 5 years. Have your cholesterol levels checked more often if: Your lipid or cholesterol levels are high. You are older than 62 years of age. You are at high risk for heart disease. What should I know about cancer screening? Depending on your health history and family history, you may need to have cancer screening at various ages. This may include screening for: Breast cancer. Cervical cancer. Colorectal cancer. Skin cancer. Lung cancer. What should I know about heart disease, diabetes, and high blood pressure? Blood pressure and heart disease High blood pressure causes heart disease and increases the risk of stroke. This is more likely to develop in people who have high blood pressure readings or are overweight. Have your blood pressure checked: Every 3-5 years if you are 67-25 years of age. Every year if you are 75 years old or older. Diabetes Have regular diabetes screenings. This checks your fasting blood sugar level. Have the screening done: Once every three years after age 59 if you are at a normal weight and have a low risk for diabetes. More often  and at a younger age if you are overweight or have a high risk for diabetes. What should I know about preventing infection? Hepatitis B If you have a higher risk for hepatitis B, you should be screened for this virus. Talk with your health care provider to  find out if you are at risk for hepatitis B infection. Hepatitis C Testing is recommended for: Everyone born from [birth year] through 1966. Anyone with known risk factors for hepatitis C. Sexually transmitted infections (STIs) Get screened for STIs, including gonorrhea and chlamydia, if: You are sexually active and are younger than 61 years of age. You are older than 62 years of age and your health care provider tells you that you are at risk for this type of infection. Your sexual activity has changed since you were last screened, and you are at increased risk for chlamydia or gonorrhea. Ask your health care provider if you are at risk. Ask your health care provider about whether you are at high risk for HIV. Your health care provider may recommend a prescription medicine to help prevent HIV infection. If you choose to take medicine to prevent HIV, you should first get tested for HIV. You should then be tested every 3 months for as long as you are taking the medicine. Pregnancy If you are about to stop having your period (premenopausal) and you may become pregnant, seek counseling before you get pregnant. Take 400 to 800 micrograms (mcg) of folic acid every day if you become pregnant. Ask for birth control (contraception) if you want to prevent pregnancy. Osteoporosis and menopause Osteoporosis is a disease in which the bones lose minerals and strength with aging. This can result in bone fractures. If you are 66 years old or older, or if you are at risk for osteoporosis and fractures, ask your health care provider if you should: Be screened for bone loss. Take a calcium or vitamin D supplement to lower your risk of fractures. Be given hormone replacement therapy (HRT) to treat symptoms of menopause. Follow these instructions at home: Alcohol use Do not drink alcohol if: Your health care provider tells you not to drink. You are pregnant, may be pregnant, or are planning to become pregnant. If you  drink alcohol: Limit how much you have to: 0-1 drink a day. Know how much alcohol is in your drink. In the [place] one drink equals one 12 oz bottle of beer (355 mL), one 5 oz glass of wine (148 mL), or one 1 oz glass of hard liquor (44 mL). Lifestyle Do not use any products that contain nicotine or tobacco. These products include cigarettes, chewing tobacco, and vaping devices, such as e-cigarettes. If you need help quitting, ask your health care provider. Do not use street drugs. Do not share needles. Ask your health care provider for help if you need support or information about quitting drugs. General instructions Schedule regular health, dental, and eye exams. Stay current with your vaccines. Tell your health care provider if: You often feel depressed. You have ever been abused or do not feel safe at home. Summary Adopting a healthy lifestyle and getting preventive care are important in promoting health and wellness. Follow your health care provider's instructions about healthy diet, exercising, and getting tested or screened for diseases. Follow your health care provider's instructions on monitoring your cholesterol and blood pressure. This information is not intended to replace advice given to you by your health care provider. Make sure you discuss any questions you have with  your health care provider. Document Revised: 09/07/2020 Document Reviewed: 09/07/2020 Elsevier Patient Education  [employer].

## 2022-03-16 NOTE — Assessment & Plan Note (Signed)
Problem has been poorly controlled. We discussed possible complications of elevated glucose. No changes in Lantus dose. Stressed the importance of following with her endocrinologist, appt in 06/2022.

## 2022-03-16 NOTE — Assessment & Plan Note (Signed)
SBP mildly elevated today, reporting most BP's at home <= 140/80. Continue Amlodipine 10 mg daily, Lisinopril-HCTZ 20-12.5 mg 2 tabs daily, Hydralazine 50 mg tid,and Metoprolol succinate 50 mg daily. Low salt diet recommended. Continue monitoring BP regularly.

## 2022-03-17 LAB — HEMOGLOBIN A1C: Hgb A1c MFr Bld: >14 % of total Hgb — ABNORMAL HIGH (ref <5.7)

## 2022-03-18 NOTE — Progress Notes (Signed)
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 The pt reports that she is doing well overall.   The pt reports that her axillary lymphadenopathy was picked up during a routing Mammogram on 05/31/2019. Pt denies feeling any lumps or bumps in either axillary regions. Pt has felt the same for the last 6-12 months.   She has HTN and Diabetes. Her HTN is currently well-controlled but her Diabetes has been harder to control. Pt notes that "sometimes it's up and sometimes it's down". 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 She feels that the reaction on her left side is a little slower than her right side, but nothing that's noticeable. She denies any weakness, vision changes, or other residual symptoms from either stroke. 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 Pt made a full recovery and has no remaining symptoms. Her eczema is seasonal and not very bothersome. She denies any concerns for frequent infections. Pt has neuropathy in her hands/fingers and has been prescribed Gabapentin, which is helping.   Pt does not drink much alcohol and has never been a smoker. Her father passed from [place] and had a history of smoking. Her mother passed from a stroke last spring. 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 She is living alone and has one sister in the area.   Pt has had her first dose of the  COVID19 vaccine and tolerated it well. She has the second dose scheduled.   Of note prior to the patient's visit today, pt has had Flow Pathology ([id number]) completed on 06/26/2019 with results revealing "Monoclonal B-cell population identified."  Pt has had Right Axilla LN Bx ([id number]) completed on 06/26/2019 with results revealing "NON-HODGKIN B-CELL LYMPHOMA"   Pt has also had Mammogram ([id number]) completed on 05/31/2019 with results revealing "Further evaluation is suggested for prominent lymph nodes in the bilateral axilla."  Most recent lab results (03/13/2019) of CBC and CMP is as follows: all values are WNL except for RBC at 5.61, MCV at 78.8, MCH at 25.8, Sodium at 131, Glucose at 502, BUN at 24.  On review of systems, pt reports hard stools, tingling/numbness in fingers/hands and denies weakness, vision changes, fevers, chills, night sweats, SOB, fatigue, constipation, diarrhea and any other symptoms.   On PMHx the pt reports Guillain-Barr syndrome, Type II Diabetes, Atopic Eczema, HLD, HTN, Stroke x2, Neuropathy. On Social Hx the pt reports that she does not drink much alcohol and is a non-smoker.  INTERVAL HISTORY: (09/06/2021)   [name] is a wonderful 61 y.o. female who is here for continued evaluation and management of her small lymphocytic lymphoma . Patient notes no acute new symptoms since her last clinic visit. No new lumps or bumps.  No fevers no chills no night sweats no unexpected weight loss. No chest pain or shortness of breath.  No abdominal pain or distention.. Labs done today were reviewed in detail with the patient.  MEDICAL HISTORY:  Past Medical History:  Diagnosis Date   Abnormality of gait 05/10/2010   BACK PAIN 11/14/2008   Class 1 obesity due to excess calories with body mass index (BMI) of 31.0 to 31.9 in adult 02/07/2021   DIABETES MELLITUS, TYPE II 07/15/2008   Diplopia 07/15/2008   ECZEMA, ATOPIC 04/03/2009   Guillain-Barre  ([place]) 1988   HYPERLIPIDEMIA 03/06/2009   HYPERTENSION 07/15/2008   Stroke ([place]) 2010, 2011   x2    Vertebral artery stenosis     SURGICAL HISTORY: Past Surgical History:  Procedure Laterality Date   ABDOMINAL HYSTERECTOMY     DILATION AND CURETTAGE OF UTERUS     FOOT SURGERY      SOCIAL HISTORY: Social History   Socioeconomic History   Marital status: Single    Spouse name: Not on file   Number of children: 0   Years of education: Not on file   Highest education level: Not on file  Occupational History   Occupation: custodian at [employer]	[place]  Tobacco Use   Smoking status: Never   Smokeless tobacco: Never  Vaping Use   Vaping Use: Never used  Substance and Sexual Activity   Alcohol use: No   Drug use: No   Sexual activity: Not on file  Other Topics Concern   Not on file  Social History Narrative   Right Handed    Lives in a one story home    Social Determinants of Health   Financial Resource Strain: Not on file  Food Insecurity: Not on file  Transportation Needs: Not on file  Physical Activity: Not on file  Stress: Not on file  Social Connections: Not on file  Intimate Partner Violence: Not on file    FAMILY HISTORY: Family History  Problem Relation Age of Onset   Diabetes Sister    Asthma Other    Stroke Other    Hypertension Other    Diabetes Mother    Stroke Mother    Cancer Father        pt states hae had some kind of stomach cancer, ? stomach or colon   Breast cancer Neg Hx     ALLERGIES:  has No Known Allergies.  MEDICATIONS:  Current Outpatient Medications  Medication Sig Dispense Refill   acetaminophen (TYLENOL) 500 MG tablet Take 2 tablets (1,000 mg total) by mouth every 6 (six) hours as needed. 30 tablet 0   amLODipine (NORVASC) 10 MG tablet Take 1 tablet by mouth once daily 30 tablet 3   atorvastatin (LIPITOR) 80 MG tablet Take 1 tablet (80 mg total) by mouth daily. 30 tablet 0   Blood Pressure Monitoring (BLOOD PRESSURE  MONITOR AUTOMAT) DEVI 1 Device by Does not apply route daily. 1 Device 0   cilostazol (PLETAL) 50 MG tablet Take 1 tablet (50 mg total) by mouth 2 (two) times daily. 180 tablet 1   clobetasol ointment (TEMOVATE) 0.05 % Apply topically at bedtime.     Continuous Blood Gluc Sensor (FREESTYLE LIBRE 2 SENSOR) MISC 1 Device by Does not apply route every 14 (fourteen) days. 6 each 3   FLUoxetine (PROZAC) 20 MG capsule Take 1 capsule by mouth once daily 90 capsule 0   fluticasone (FLONASE) 50 MCG/ACT nasal spray Place 1 spray into both nostrils 2 (two) times daily. 16 g 3   glucose blood (CONTOUR NEXT TEST) test strip 1 each by Other route 2 (two) times daily. And lancets 2/day 200 each 3   glucose blood (  ONETOUCH VERIO) test strip USE TO CHECK BLOOD SUGAR TWICE A DAY AND PRN 100 each 6   hydrALAZINE (APRESOLINE) 50 MG tablet Take 1 tablet (50 mg total) by mouth 3 (three) times daily. 90 tablet 1   insulin glargine (LANTUS) 100 UNIT/ML Solostar Pen Inject 160 Units into the skin every morning. And pen needles 1/day 165 mL 3   lisinopril-hydrochlorothiazide (ZESTORETIC) 20-12.5 MG tablet Take 2 tablets by mouth once daily 60 tablet 3   metoprolol succinate (TOPROL-XL) 50 MG 24 hr tablet TAKE 1 TABLET BY MOUTH ONCE DAILY WITH MEALS OR  IMMEDIATELY  FOLLOWING  A  MEAL 30 tablet 3   ONE TOUCH LANCETS MISC USE TO CHECK BLOOD SUGAR TWICE A DAY AND PRN 100 each 6   pantoprazole (PROTONIX) 40 MG tablet Take 1 tablet (40 mg total) by mouth daily. 30 tablet 3   topiramate (TOPAMAX) 25 MG tablet Take 1 tablet (25 mg total) by mouth 2 (two) times daily. 60 tablet 0   triamcinolone ointment (KENALOG) 0.5 % Apply topically 2 (two) times daily as needed. 30 g 1   No current facility-administered medications for this visit.    REVIEW OF SYSTEMS: 10 Point review of Systems was done is negative except as noted above.    PHYSICAL EXAMINATION: ECOG PERFORMANCE STATUS: 0 - Asymptomatic  . Vitals:   09/06/21 1005   BP: (!) 150/72  Pulse: 70  Resp: 17  Temp: 97.9 F (36.6 C)  SpO2: 100%   Filed Weights   09/06/21 1005  Weight: 194 lb 11.2 oz (88.3 kg)   .Body mass index is 33.42 kg/m.   NAD GENERAL:alert, in no acute distress and comfortable SKIN: no acute rashes, no significant lesions EYES: conjunctiva are pink and non-injected, sclera anicteric OROPHARYNX: MMM, no exudates, no oropharyngeal erythema or ulceration NECK: supple, no JVD LYMPH:  no palpable lymphadenopathy in the cervical, axillary or inguinal regions LUNGS: clear to auscultation b/l with normal respiratory effort HEART: regular rate & rhythm ABDOMEN:  normoactive bowel sounds , non tender, not distended. Extremity: no pedal edema PSYCH: alert & oriented x 3 with fluent speech NEURO: no focal motor/sensory deficits    LABORATORY DATA:  I have reviewed the data as listed  .    Latest Ref Rng & Units 03/11/2022   12:48 PM 01/20/2022   10:18 AM 01/20/2022    9:54 AM  CBC  WBC 4.0 - 10.5 K/uL 5.5   6.4   Hemoglobin 12.0 - 15.0 g/dL 11.5  12.6  12.6   Hematocrit 36.0 - 46.0 % 34.8  37.0  37.8   Platelets 150 - 400 K/uL 256   261     .    Latest Ref Rng & Units 03/11/2022   12:48 PM 01/20/2022   10:18 AM 01/20/2022    9:54 AM  CMP  Glucose 70 - 99 mg/dL 303   390   BUN 8 - 23 mg/dL 21   22   Creatinine 0.44 - 1.00 mg/dL 1.47   1.13   Sodium 135 - 145 mmol/L 137  133  133   Potassium 3.5 - 5.1 mmol/L 4.1  4.0  4.1   Chloride 98 - 111 mmol/L 103   99   CO2 22 - 32 mmol/L 29   24   Calcium 8.9 - 10.3 mg/dL 9.3   9.9   Total Protein 6.5 - 8.1 g/dL 7.1     Total Bilirubin 0.3 - 1.2 mg/dL 0.4  Alkaline Phos 38 - 126 U/L 102     AST 15 - 41 U/L 20     ALT 0 - 44 U/L 19      . Lab Results  Component Value Date   LDH 188 03/08/2021    07/16/2019 FISH-CLL Prognostic Panel:         RADIOGRAPHIC STUDIES: I have personally reviewed the radiological images as listed and agreed with the findings in the  report. No results found.  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 No consitutional symptoms.  05/31/2019 Mammogram ([id number]) revealed "Further evaluation is suggested for prominent lymph nodes in the bilateral axilla." 06/26/2019 Flow Pathology ([id number]) revealed "Monoclonal B-cell population identified." 06/26/2019 Right Axilla LN Bx ([id number]) revealed "NON-HODGKIN B-CELL LYMPHOMA"  07/16/2019 FISH/CLL Prognostic Panel ([id number]) revealed "Trisomy 12 (+12) is present."  10/15/2019 CT Soft Tissue Neck ([id number]) revealed bilateral cervical lymphadenopathy. 10/15/2019 CT C/A/P ([id number]) ([id number]) revealed several small lymph nodes in axillary, retroperitoneal, and pelvic regions. Incidentally noted complex renal cyst.  Plan -Patient's labs from today were discussed in detail with her CBC stable with a hemoglobin of 11.5 normal WBC count of 5.5k and platelets of 256k CMP stable other than a chronic kidney disease with a creatinine of 1.47. Patient has no new symptoms suggestive of overt SLL progression at this time. No clear indication to initiate treatment with the patient's SLL at this time.  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    Plan -Hemoglobin today is stable. -Electrophoresis shows sickle cell trait And thalassemia gene deletion studies show alpha thalassemia trait with deletion of 1 alpha gene.  3) nonmelanoma skin cancer on lower  extremities. -Continue follow-up with dermatology for evaluation and management. -I discussed with the patient that her non-Hodgkin's lymphoma/SLL could be a risk factor for recurrent nonmelanoma skin cancers.  4) . Patient Active Problem List   Diagnosis Date Noted   GERD (gastroesophageal reflux disease) 04/30/2021   Peripheral arterial disease ([place]) 04/13/2021   Atherosclerosis of aorta ([place]) 02/14/2021   Cerebral thrombosis with cerebral infarction 02/08/2021   Abnormal MRI of head 02/07/2021   Vertigo 02/07/2021   Class 1 obesity due to excess calories with body mass index (BMI) of 31.0 to 31.9 in adult 02/07/2021   Small lymphocytic lymphoma ([place]) 10/08/2019   Trigger finger, left ring finger 10/04/2018   Alternating constipation and diarrhea 04/20/2018   Diabetic peripheral neuropathy associated with type 2 diabetes mellitus ([place]) 02/26/2018   DM (diabetes mellitus), type 2 with peripheral vascular complications ([place]) 35/68/6168   ABNORMALITY OF GAIT 05/10/2010   ECZEMA, ATOPIC 04/03/2009   Hyperlipidemia associated with type 2 diabetes mellitus ([place]) 03/06/2009   BACK PAIN 11/14/2008   Cerebrovascular disease 08/05/2008   DIPLOPIA 07/15/2008   Essential hypertension 07/15/2008   FOLLOW UP: Return to clinic with Dr. Irene Limbo with labs in 6 months  The total time spent in the appointment was 23 minutes*.  All of the patient's questions were answered with apparent satisfaction. The patient knows to call the clinic with any problems, questions or concerns.  Sullivan Lone MD MS AAHIVMS [facility] Hematology/Oncology [facility]  .*Total Encounter Time as defined by the Centers for Medicare and Medicaid Services includes, in addition to the face-to-face time of a patient visit (documented in the note above) non-face-to-face time: obtaining and reviewing outside history, ordering and reviewing medications, tests or procedures, care coordination (communications  with other health care professionals or caregivers) and documentation in the medical record.

## 2022-03-19 MED ORDER — HUMALOG KWIKPEN 200 UNIT/ML ~~LOC~~ SOPN: 5.0000 [IU] | PEN_INJECTOR | Freq: Three times a day (TID) | SUBCUTANEOUS | 1 refills | Status: DC

## 2022-03-19 NOTE — Assessment & Plan Note (Signed)
Continue Zetia 10 mg and Atorvastatin 80 mg daily. Low fat diet also recommended. Further recommendations will be given according to FLP results.

## 2022-03-19 NOTE — Assessment & Plan Note (Addendum)
We discussed the importance of regular physical activity and healthy diet for prevention of chronic illness and/or complications. Preventive guidelines reviewed. Overdue for mammogram, instructed to call and arrange appt. S/P hysterectomy due to fibroids. GI referral placed. Vaccination: Needs Shingrix, would like to call her health insurance before she gets it. Ca++ and vit D supplementation recommended. CPE in a year.

## 2022-03-21 ENCOUNTER — Other Ambulatory Visit: Payer: Self-pay

## 2022-03-21 DIAGNOSIS — E1151 Type 2 diabetes mellitus with diabetic peripheral angiopathy without gangrene: Secondary | ICD-10-CM

## 2022-03-21 MED ORDER — INSULIN GLARGINE 100 UNIT/ML SOLOSTAR PEN: 27.0000 [IU] | PEN_INJECTOR | Freq: Every day | SUBCUTANEOUS | 3 refills | Status: DC

## 2022-04-08 ENCOUNTER — Ambulatory Visit (HOSPITAL_COMMUNITY)
Admission: RE | Admit: 2022-04-08 | Discharge: 2022-04-08 | Disposition: A | Discharge status: Home / Self Care | Payer: Commercial Managed Care - HMO | Source: Ambulatory Visit | Attending: Cardiovascular Disease | Admitting: Cardiovascular Disease

## 2022-04-08 DIAGNOSIS — I739 Peripheral vascular disease, unspecified: Secondary | ICD-10-CM | POA: Insufficient documentation

## 2022-04-13 ENCOUNTER — Other Ambulatory Visit (HOSPITAL_COMMUNITY): Payer: Self-pay

## 2022-04-13 ENCOUNTER — Encounter: Payer: Self-pay | Admitting: Internal Medicine

## 2022-04-13 ENCOUNTER — Other Ambulatory Visit: Payer: Self-pay

## 2022-04-13 ENCOUNTER — Ambulatory Visit: Payer: Commercial Managed Care - HMO | Admitting: Internal Medicine

## 2022-04-13 VITALS — BP 124/80 | HR 60 | Ht 64.0 in | Wt 179.0 lb

## 2022-04-13 DIAGNOSIS — E1151 Type 2 diabetes mellitus with diabetic peripheral angiopathy without gangrene: Secondary | ICD-10-CM | POA: Diagnosis not present

## 2022-04-13 DIAGNOSIS — E1122 Type 2 diabetes mellitus with diabetic chronic kidney disease: Secondary | ICD-10-CM

## 2022-04-13 DIAGNOSIS — N1832 Chronic kidney disease, stage 3b: Secondary | ICD-10-CM

## 2022-04-13 DIAGNOSIS — E1142 Type 2 diabetes mellitus with diabetic polyneuropathy: Secondary | ICD-10-CM

## 2022-04-13 DIAGNOSIS — Z794 Long term (current) use of insulin: Secondary | ICD-10-CM

## 2022-04-13 LAB — POCT GLUCOSE (DEVICE FOR HOME USE): POC Glucose: 407 mg/dl — AB (ref 70–99)

## 2022-04-13 MED ORDER — HUMALOG KWIKPEN 200 UNIT/ML ~~LOC~~ SOPN: 8.0000 [IU] | PEN_INJECTOR | Freq: Three times a day (TID) | SUBCUTANEOUS | 6 refills | Status: DC

## 2022-04-13 MED ORDER — INSULIN GLARGINE 100 UNIT/ML SOLOSTAR PEN: 30.0000 [IU] | PEN_INJECTOR | Freq: Every day | SUBCUTANEOUS | 3 refills | Status: DC

## 2022-04-13 MED ORDER — TRULICITY 0.75 MG/0.5ML ~~LOC~~ SOAJ: 0.7500 mg | SUBCUTANEOUS | 3 refills | Status: DC

## 2022-04-13 NOTE — Progress Notes (Unsigned)
Name: [name]  Age/ Sex: 61 y.o., female   MRN/ DOB: [id number], 1960/12/26     PCP: [name], [name], MD   Reason for Endocrinology Evaluation: Type 2 Diabetes Mellitus  Initial Endocrine Consultative Visit: 11/22/2013    PATIENT IDENTIFIER: [name] is a 61 y.o. female with a past medical history of DM, HTN and dyslipidemia. The patient has followed with Endocrinology clinic since 11/22/2013 for consultative assistance with management of her diabetes.  DIABETIC HISTORY:  [name] was diagnosed with DM 2010.  Insulin started shortly after diagnosis.  Her hemoglobin A1c has ranged from 8.9% in 2021, peaking at 14.0% in 2023.  She was followed by Dr. Loanne Drilling from 2015 until 2022  SUBJECTIVE:   During the last visit (02/09/2021): Saw Dr. Loanne Drilling  Today (04/13/2022): [name] is here for follow-up on diabetes management.  She checks her blood sugars occasionally.     She had an ED visit on 01/20/2022 for hyperglycemia She drinks occasional sugar-sweetened beverages   She was started on Humalog recently  CGM was cost prohibitive   Denies nausea, vomiting or diarrhea   HOME DIABETES REGIMEN:  Lantus 27 units daily  Humalog 5 units TIDQAC     Statin: Yes ACE-I/ARB: Yes   METER DOWNLOAD SUMMARY:   DIABETIC COMPLICATIONS: Microvascular complications:  Neuropathy Denies:  Last Eye Exam: Completed   Macrovascular complications:  CVA, PVD Denies: CAD   HISTORY:  Past Medical History:  Past Medical History:  Diagnosis Date   Abnormality of gait 05/10/2010   BACK PAIN 11/14/2008   Class 1 obesity due to excess calories with body mass index (BMI) of 31.0 to 31.9 in adult 02/07/2021   DIABETES MELLITUS, TYPE II 07/15/2008   Diplopia 07/15/2008   ECZEMA, ATOPIC 04/03/2009   GERD (gastroesophageal reflux disease)    Guillain-Barre ([place]) 1988   HYPERLIPIDEMIA 03/06/2009   HYPERTENSION 07/15/2008   Stroke ([place]) 2010, 2011   x2    Vertebral  artery stenosis    Past Surgical History:  Past Surgical History:  Procedure Laterality Date   ABDOMINAL HYSTERECTOMY     DILATION AND CURETTAGE OF UTERUS     FOOT SURGERY     IR ANGIO INTRA EXTRACRAN SEL COM CAROTID INNOMINATE UNI L MOD SED  09/17/2021   IR ANGIO INTRA EXTRACRAN SEL INTERNAL CAROTID UNI R MOD SED  09/17/2021   IR ANGIO VERTEBRAL SEL VERTEBRAL UNI R MOD SED  09/17/2021   IR US GUIDE VASC ACCESS RIGHT  09/17/2021   Social History:  reports that she has never smoked. She has never used smokeless tobacco. She reports that she does not drink alcohol and does not use drugs. Family History:  Family History  Problem Relation Age of Onset   Diabetes Sister    Asthma Other    Stroke Other    Hypertension Other    Diabetes Mother    Stroke Mother    Cancer Father        pt states hae had some kind of stomach cancer, ? stomach or colon   Breast cancer Neg Hx      HOME MEDICATIONS: Allergies as of 04/13/2022   No Known Allergies      Medication List        Accurate as of April 13, 2022  1:07 PM. If you have any questions, ask your nurse or doctor.          STOP taking these medications    FreeStyle Libre 2 Sensor  Misc Stopped by: Dorita Sciara, MD       TAKE these medications    amLODipine 10 MG tablet Commonly known as: NORVASC Take 1 tablet by mouth once daily What changed: when to take this   aspirin 325 MG tablet Take 1 tablet (325 mg total) by mouth daily.   atorvastatin 80 MG tablet Commonly known as: Lipitor Take 1 tablet (80 mg total) by mouth daily.   Blood Pressure Monitor Automat Devi 1 Device by Does not apply route daily.   cilostazol 50 MG tablet Commonly known as: PLETAL Take 1 tablet by mouth twice daily   clopidogrel 75 MG tablet Commonly known as: PLAVIX Take 1 tablet (75 mg total) by mouth daily.   ezetimibe 10 MG tablet Commonly known as: ZETIA Take 1 tablet (10 mg total) by mouth daily.   FLUoxetine 20  MG capsule Commonly known as: PROZAC Take 1 capsule by mouth once daily   gabapentin 300 MG capsule Commonly known as: Neurontin Take 1 capsule (300 mg total) by mouth daily for 1 day, THEN 1 capsule (300 mg total) 2 (two) times daily for 1 day, THEN 1 capsule (300 mg total) 3 (three) times daily for 28 days. Start taking on: January 20, 2022   glucose blood test strip Commonly known as: OneTouch Verio USE TO CHECK BLOOD SUGAR TWICE A DAY AND PRN   glucose blood test strip Commonly known as: Contour Next Test 1 each by Other route 2 (two) times daily. And lancets 2/day   HumaLOG KwikPen 200 UNIT/ML KwikPen Generic drug: insulin lispro Inject 5 Units into the skin 3 (three) times daily before meals. 10 min before meals   hydrALAZINE 50 MG tablet Commonly known as: APRESOLINE TAKE 1 TABLET BY MOUTH THREE TIMES DAILY   insulin glargine 100 UNIT/ML Solostar Pen Commonly known as: LANTUS Inject 27 Units into the skin daily. And pen needles 1/day   lisinopril-hydrochlorothiazide 20-12.5 MG tablet Commonly known as: ZESTORETIC Take 2 tablets by mouth once daily What changed: when to take this   metoprolol succinate 50 MG 24 hr tablet Commonly known as: TOPROL-XL TAKE 1 TABLET BY MOUTH ONCE DAILY WITH MEALS OR  IMMEDIATELY  FOLLOWING  A  MEAL What changed: See the new instructions.   ONE TOUCH LANCETS Misc USE TO CHECK BLOOD SUGAR TWICE A DAY AND PRN   pantoprazole 40 MG tablet Commonly known as: PROTONIX Take 1 tablet (40 mg total) by mouth daily. What changed: when to take this   Vitamin B Complex Tabs Take 1 tablet by mouth every morning.   VITAMIN D3 PO Take 1 capsule by mouth every morning.         OBJECTIVE:   Vital Signs: BP 124/80 (BP Location: Left Arm, Patient Position: Sitting, Cuff Size: Large)   Pulse 60   Ht '5\' 4"'$  (1.626 m)   Wt 179 lb (81.2 kg)   SpO2 96%   BMI 30.73 kg/m   Wt Readings from Last 3 Encounters:  04/13/22 179 lb (81.2 kg)   03/16/22 180 lb 4 oz (81.8 kg)  03/11/22 182 lb (82.6 kg)     Exam: General: Pt appears well and is in NAD  Neck: General: Supple without adenopathy. Thyroid: Thyroid size normal.  No goiter or nodules appreciated.   Lungs: Clear with good BS bilat   Heart: RRR   Abdomen:  soft, nontender  Extremities: No pretibial edema.   Neuro: MS is good with appropriate affect, pt is alert  and Ox3      DATA REVIEWED:  Lab Results  Component Value Date   HGBA1C >14.0 (H) 03/16/2022   HGBA1C 12.9 (H) 09/16/2021   HGBA1C 13.9 (A) 02/09/2021   Lab Results  Component Value Date   MICROALBUR 223.0 (H) 03/16/2022   LDLCALC 114 (H) 03/16/2022   CREATININE 1.47 (H) 03/11/2022   Lab Results  Component Value Date   MICRALBCREAT 428.1 (H) 03/16/2022     Lab Results  Component Value Date   CHOL 181 03/16/2022   HDL 41.50 03/16/2022   LDLCALC 114 (H) 03/16/2022   LDLDIRECT 175.9 03/02/2012   TRIG 128.0 03/16/2022   CHOLHDL 4 03/16/2022       In office BG 407 mg/dL   ASSESSMENT / PLAN / RECOMMENDATIONS:   1) Type 2 Diabetes Mellitus, poorly controlled, With neuropathic and macrovascular complications - Most recent A1c of 14 %. Goal A1c < 7.0 %.    Plan: MEDICATIONS: ***  EDUCATION / INSTRUCTIONS: BG monitoring instructions: Patient is instructed to check her blood sugars *** times a day, ***. Call [place] [facility] if: BG persistently < 70  I reviewed the Rule of 15 for the treatment of hypoglycemia in detail with the patient. Literature supplied.    2) Diabetic complications:  Eye: Does *** have known diabetic retinopathy.  Neuro/ Feet: Does *** have known diabetic peripheral neuropathy .  Renal: Patient does *** have known baseline CKD. She   is *** on an ACEI/ARB at present.    3) Lipids: Patient is *** on a statin.  4) Hypertension: *** at goal of < 140/90 mmHg.    F/U in ***    Signed electronically by: Mack Guise, MD  [facility]  Endocrinology  [facility] [address] [place], [place] [address] Phone: [phone] FAX: [phone]   CC: [name], [name], [address] [place] [address] Phone: [phone]  Fax: [phone]  Return to Endocrinology clinic as below: Future Appointments  Date Time Provider [place]  04/13/2022  1:20 PM Willmer Fellers, Melanie Crazier, MD [facility] None  04/20/2022 10:30 AM Narda Amber K, DO [facility] None  09/12/2022 11:30 AM [facility] None  09/12/2022 12:00 PM Brunetta Genera, MD [facility] None

## 2022-04-13 NOTE — Patient Instructions (Addendum)
Increase Lantus 30 units daily  Increase Humalog 8 units TIDQAC Trulicity 5.43 mg once weekly     HOW TO TREAT LOW BLOOD SUGARS (Blood sugar LESS THAN 70 MG/DL) Please follow the RULE OF 15 for the treatment of hypoglycemia treatment (when your (blood sugars are less than 70 mg/dL)   STEP 1: Take 15 grams of carbohydrates when your blood sugar is low, which includes:  3-4 GLUCOSE TABS  OR 3-4 OZ OF JUICE OR REGULAR SODA OR ONE TUBE OF GLUCOSE GEL    STEP 2: RECHECK blood sugar in 15 MINUTES STEP 3: If your blood sugar is still low at the 15 minute recheck --> then, go back to STEP 1 and treat AGAIN with another 15 grams of carbohydrates.

## 2022-04-14 DIAGNOSIS — Z794 Long term (current) use of insulin: Secondary | ICD-10-CM | POA: Insufficient documentation

## 2022-04-15 ENCOUNTER — Other Ambulatory Visit (HOSPITAL_COMMUNITY): Payer: Self-pay

## 2022-04-18 ENCOUNTER — Telehealth: Payer: Self-pay

## 2022-04-18 NOTE — Telephone Encounter (Signed)
Patient just needs a lab appointment for a repeat urine, no fasting needed.

## 2022-04-18 NOTE — Telephone Encounter (Signed)
-----   Message from Rodrigo Ran, [place] sent at 03/21/2022 10:20 AM EST ----- Micro/albumin in 4 weeks

## 2022-04-20 ENCOUNTER — Ambulatory Visit: Payer: Commercial Managed Care - HMO | Admitting: Neurology

## 2022-04-20 ENCOUNTER — Telehealth: Payer: Self-pay | Admitting: Family Medicine

## 2022-04-20 ENCOUNTER — Encounter: Payer: Self-pay | Admitting: Neurology

## 2022-04-20 ENCOUNTER — Other Ambulatory Visit: Payer: Commercial Managed Care - HMO

## 2022-04-20 VITALS — BP 150/72 | HR 60 | Ht 64.0 in | Wt 178.0 lb

## 2022-04-20 DIAGNOSIS — I679 Cerebrovascular disease, unspecified: Secondary | ICD-10-CM | POA: Diagnosis not present

## 2022-04-20 DIAGNOSIS — G5622 Lesion of ulnar nerve, left upper limb: Secondary | ICD-10-CM | POA: Diagnosis not present

## 2022-04-20 DIAGNOSIS — E1151 Type 2 diabetes mellitus with diabetic peripheral angiopathy without gangrene: Secondary | ICD-10-CM

## 2022-04-20 DIAGNOSIS — I639 Cerebral infarction, unspecified: Secondary | ICD-10-CM

## 2022-04-20 NOTE — Progress Notes (Signed)
Follow-up Visit   Date: 04/20/22   [name] MRN: [id number] DOB: October 20, 1960   Interim History: [name] is a 61 y.o. 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  The patient was accompanied to the clinic by self.  She has been on dual antiplatelet therapy with plavix '75mg'$  + aspirin '325mg'$  since her stroke in May.  Fortunately, she does not have any interval TIA or stroke.  Diabetes remains poorly controlled.  LDL has improved on lipitor '80mg'$  and now 114, previously 240. BP is elevated at 150/72 today, but tends to run lower at home.  Since her last stroke, she has noticed weakness in the left hand with numbness involving the last two fingers.  She denies neck pain or shooting pain down the arm.  Right hand 5th finger also has similar symptoms, but much lesser degree.     Medications:  Current Outpatient Medications on File Prior to Visit  Medication Sig Dispense Refill   amLODipine (NORVASC) 10 MG tablet Take 1 tablet by mouth once daily (Patient taking differently: Take 10 mg by mouth every morning.) 30 tablet 3   aspirin 325 MG tablet Take 1 tablet (325 mg total) by mouth daily. 30 tablet 3   atorvastatin (LIPITOR) 80 MG tablet Take 1 tablet (80 mg total) by mouth daily. 30 tablet 3   B Complex Vitamins (VITAMIN B COMPLEX) TABS Take 1 tablet by mouth every morning.     Blood Pressure Monitoring (BLOOD PRESSURE MONITOR AUTOMAT) DEVI 1 Device by Does not apply route daily. 1 Device 0   Cholecalciferol (VITAMIN D3 PO) Take 1 capsule by mouth every morning.     cilostazol (PLETAL) 50 MG tablet Take 1 tablet by mouth twice daily 180 tablet 0   clopidogrel (PLAVIX) 75 MG tablet Take 1 tablet  (75 mg total) by mouth daily. 30 tablet 3   Dulaglutide (TRULICITY) 2.97 LG/9.2JJ SOPN Inject 0.75 mg into the skin once a week. 6 mL 3   ezetimibe (ZETIA) 10 MG tablet Take 1 tablet (10 mg total) by mouth daily. 30 tablet 3   FLUoxetine (PROZAC) 20 MG capsule Take 1 capsule by mouth once daily 90 capsule 2   glucose blood (CONTOUR NEXT TEST) test strip 1 each by Other route 2 (two) times daily. And lancets 2/day 200 each 3   glucose blood (ONETOUCH VERIO) test strip USE TO CHECK BLOOD SUGAR TWICE A DAY AND PRN 100 each 6   hydrALAZINE (APRESOLINE) 50 MG tablet TAKE 1 TABLET BY MOUTH THREE TIMES DAILY 90 tablet 2   insulin glargine (LANTUS) 100 UNIT/ML Solostar Pen Inject 30 Units into the skin daily. And pen needles 1/day 15 mL 3   insulin lispro (HUMALOG KWIKPEN) 200 UNIT/ML KwikPen Inject 8 Units into the skin 3 (three) times daily before meals. 10 min before meals 15 mL 6   lisinopril-hydrochlorothiazide (ZESTORETIC) 20-12.5 MG tablet Take 2 tablets by mouth once daily (Patient taking differently: Take 2 tablets by mouth every morning.) 60 tablet 3   metoprolol succinate (TOPROL-XL) 50 MG 24 hr tablet TAKE 1 TABLET BY MOUTH ONCE DAILY WITH MEALS OR  IMMEDIATELY  FOLLOWING  A  MEAL (Patient taking differently: 50 mg every morning.) 30 tablet 3   ONE TOUCH LANCETS MISC USE TO CHECK BLOOD SUGAR  TWICE A DAY AND PRN 100 each 6   pantoprazole (PROTONIX) 40 MG tablet Take 1 tablet (40 mg total) by mouth daily. (Patient taking differently: Take 40 mg by mouth every morning.) 30 tablet 3   gabapentin (NEURONTIN) 300 MG capsule Take 1 capsule (300 mg total) by mouth daily for 1 day, THEN 1 capsule (300 mg total) 2 (two) times daily for 1 day, THEN 1 capsule (300 mg total) 3 (three) times daily for 28 days. 87 capsule 0   No current facility-administered medications on file prior to visit.    Allergies: No Known Allergies  Vital Signs:  BP (!) 150/72   Pulse 60   Ht '5\' 4"'$  (1.626 m)   Wt 178 lb (80.7  kg)   SpO2 99%   BMI 30.55 kg/m   Neurological Exam: MENTAL STATUS including orientation to time, place, person, recent and remote memory, attention span and concentration, language, and fund of knowledge is normal.  Speech is not dysarthric.  CRANIAL NERVES:   Pupils equal round and reactive to light.  Normal conjugate, extra-ocular eye movements in all directions of gaze.  No ptosis .  Face is symmetric. Palate elevates symmetrically.  Tongue is midline.  MOTOR:  Motor strength is 5/5 in all extremities, except left finger abductors 4/5.  Moderate left FDI >> ADM atrophy.  No fasciculations or abnormal movements.  No pronator drift.  Tone is normal.    MSRs:  Reflexes are 2+/4 throughout.  SENSORY:  Mildly diminished vibration at the ankles.  COORDINATION/GAIT:  Normal finger-to- nose-finger.  Intact rapid alternating movements bilaterally.  Gait mildly wide-based, stable, unassisted.  Data: NCS/EMG of the upper extremities 06/14/2018: The electrophysiologic findings are consistent with a sensorimotor polyneuropathy, with axonal and demyelinating features.  Overall, these findings are moderate in degree electrically.   MRI head 02/07/2021: 1. Two acute/early subacute infarcts within the left pons measuring up to 8 mm. 2. Otherwise stable noncontrast MRI appearance of the brain as compared to 04/09/2020. 3. Mild chronic small vessel ischemic changes within the cerebral white matter. 4. Small chronic lacunar infarct within the left cerebellar hemisphere. 5. Incompletely imaged and incompletely assessed cervical lymphadenopathy.   MRA head: 1. New from the prior MRA head of 05/10/2010, the distal V4 right vertebral artery appears occluded. Also new, there is near-occlusive stenosis versus occlusion of the V4 left vertebral artery (just beyond the left PICA origin). Redemonstrated severe stenosis within the distal V4 left vertebral artery. The basilar artery is patent, but somewhat  diminutive. 2. Progressive atherosclerotic disease elsewhere, as outlined and with findings most notably as follows. 3. Progressive severe stenosis within the P1 left PCA. Progressive moderate/severe stenosis within the left PCA at the P2/P3 junction. 4. New severe stenosis within the left posterior communicating artery. 5. Progressive moderate stenosis of the supraclinoid right ICA. 6. Progressive moderate stenosis of the proximal M1 right MCA. Progressive moderate/severe stenosis within the distal M1 right MCA. 7. Redemonstrated severe stenosis within an inferior division proximal M2 right MCA vessel. 8. New moderate/severe stenosis within a superior division proximal M2 left MCA vessel. 9. Redemonstrated moderate/severe stenosis within an inferior division proximal M2 left MCA vessel. 10. Progressive moderate/severe stenosis within the proximal A1 right ACA. 11. 2 mm inferiorly projecting vascular protrusion arising from the cavernous left ICA, which may reflect atherosclerotic lobulation or a small aneurysm. This finding was not definitively present on the prior MRA.   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  Management remains modifying risk factors (diabetes, hypertension, hyperlipidemia).  Diabetes is poorly controlled (HbA1c >14) which is  followed by endocrinology.  Continue lipitor '80mg'$  which has reduced LDL from 240 >114.    - She completed dual antiplatelet therapy with plavix '75mg'$  + ASA '325mg'$ , ok to transition to monotherapy with plavix '75mg'$   - STOP aspirin  - Continue lipitor '80mg'$  daily  - She is followed by endocrinology, cardiology, and PCP  3.  Diabetic neuropathy affecting the hands and feet, stable  Return to clinic in 6 months  Total time spent reviewing records, interview, history/exam, documentation, and coordination of care on day of encounter:  40 min    Thank you for allowing me to participate in patient's care.  If I can answer any additional questions, I would be pleased to do so.    Sincerely,    Buren Havey K. Posey Pronto, DO

## 2022-04-20 NOTE — Patient Instructions (Addendum)
Stop aspirin Continue plavix '75mg'$    Nerve testing of the left > right arms  Return to clinic in 6 months  ELECTROMYOGRAM AND NERVE CONDUCTION STUDIES (EMG/NCS) INSTRUCTIONS  How to Prepare The neurologist conducting the EMG will need to know if you have certain medical conditions. Tell the neurologist and other EMG lab personnel if you: Have a pacemaker or any other electrical medical device Take blood-thinning medications Have hemophilia, a blood-clotting disorder that causes prolonged bleeding Bathing Take a shower or bath shortly before your exam in order to remove oils from your skin. Don't apply lotions or creams before the exam.  What to Expect You'll likely be asked to change into a hospital gown for the procedure and lie down on an examination table. The following explanations can help you understand what will happen during the exam.  Electrodes. The neurologist or a technician places surface electrodes at various locations on your skin depending on where you're experiencing symptoms. Or the neurologist may insert needle electrodes at different sites depending on your symptoms.  Sensations. The electrodes will at times transmit a tiny electrical current that you may feel as a twinge or spasm. The needle electrode may cause discomfort or pain that usually ends shortly after the needle is removed. If you are concerned about discomfort or pain, you may want to talk to the neurologist about taking a short break during the exam.  Instructions. During the needle EMG, the neurologist will assess whether there is any spontaneous electrical activity when the muscle is at rest - activity that isn't present in healthy muscle tissue - and the degree of activity when you slightly contract the muscle.  He or she will give you instructions on resting and contracting a muscle at appropriate times. Depending on what muscles and nerves the neurologist is examining, he or she may ask you to change  positions during the exam.  After your EMG You may experience some temporary, minor bruising where the needle electrode was inserted into your muscle. This bruising should fade within several days. If it persists, contact your primary care doctor.

## 2022-04-20 NOTE — Telephone Encounter (Signed)
Patient dropped off form to be signed by Dr.Jordan. Charge form was completed and attached. Paperwork given to CMA for completion.       Please advise

## 2022-04-20 NOTE — Telephone Encounter (Signed)
On pcp's desk for signature.

## 2022-04-21 LAB — PROTEIN / CREATININE RATIO, URINE
Creatinine, Urine: 91 mg/dL (ref 20–275)
Protein/Creat Ratio: 3593 mg/g creat — ABNORMAL HIGH (ref 24–184)
Protein/Creatinine Ratio: 3.593 mg/mg creat — ABNORMAL HIGH (ref 0.024–0.184)
Total Protein, Urine: 327 mg/dL — ABNORMAL HIGH (ref 5–24)

## 2022-04-27 ENCOUNTER — Other Ambulatory Visit: Payer: Self-pay | Admitting: Family Medicine

## 2022-04-27 ENCOUNTER — Other Ambulatory Visit (HOSPITAL_COMMUNITY): Payer: Self-pay

## 2022-04-27 ENCOUNTER — Ambulatory Visit: Payer: 59 | Admitting: Neurology

## 2022-04-27 DIAGNOSIS — R809 Proteinuria, unspecified: Secondary | ICD-10-CM

## 2022-04-27 NOTE — Telephone Encounter (Signed)
Patient is aware form has been faxed & a copy is up front for her.

## 2022-05-09 ENCOUNTER — Other Ambulatory Visit: Payer: Self-pay | Admitting: Family Medicine

## 2022-05-09 ENCOUNTER — Telehealth: Payer: Self-pay | Admitting: Family Medicine

## 2022-05-09 ENCOUNTER — Other Ambulatory Visit (HOSPITAL_COMMUNITY): Payer: Self-pay

## 2022-05-09 ENCOUNTER — Other Ambulatory Visit: Payer: Self-pay

## 2022-05-09 DIAGNOSIS — K219 Gastro-esophageal reflux disease without esophagitis: Secondary | ICD-10-CM

## 2022-05-09 DIAGNOSIS — I10 Essential (primary) hypertension: Secondary | ICD-10-CM

## 2022-05-09 MED ORDER — PANTOPRAZOLE SODIUM 40 MG PO TBEC: 40.0000 mg | DELAYED_RELEASE_TABLET | Freq: Every day | ORAL | 3 refills | Status: DC

## 2022-05-09 MED ORDER — ATORVASTATIN CALCIUM 80 MG PO TABS
80.0000 mg | ORAL_TABLET | Freq: Every day | ORAL | 3 refills | Status: AC
  Filled 2022-05-09: qty 30, 30d supply, fill #0

## 2022-05-09 MED ORDER — CLOPIDOGREL BISULFATE 75 MG PO TABS
75.0000 mg | ORAL_TABLET | Freq: Every day | ORAL | 3 refills | Status: DC
  Filled 2022-05-09: qty 30, 30d supply, fill #0

## 2022-05-09 MED ORDER — EZETIMIBE 10 MG PO TABS
10.0000 mg | ORAL_TABLET | Freq: Every day | ORAL | 3 refills | Status: AC
  Filled 2022-05-09: qty 30, 30d supply, fill #0

## 2022-05-09 NOTE — Telephone Encounter (Signed)
Patient needs a refill on Plavix, Atorvastatin, Ezetimibe and Pantoprazole.  Pt would like to pick these up today since she is going out of town.  [place] on [address] for [place] [address]. [employer]. for Plavix, Atorvastatin and Ezetimibe  *Pt states Neurolgist took patient off of aspirin since she is on Plavix.

## 2022-05-09 NOTE — Telephone Encounter (Signed)
Rxs sent

## 2022-05-29 ENCOUNTER — Inpatient Hospital Stay (HOSPITAL_BASED_OUTPATIENT_CLINIC_OR_DEPARTMENT_OTHER)
Admission: EM | Admit: 2022-05-29 | Discharge: 2022-06-04 | DRG: 065 | Disposition: A | Discharge status: Rehabilitation Facility | Payer: Commercial Managed Care - HMO | Attending: Student | Admitting: Student

## 2022-05-29 ENCOUNTER — Encounter (HOSPITAL_BASED_OUTPATIENT_CLINIC_OR_DEPARTMENT_OTHER): Payer: Self-pay | Admitting: Emergency Medicine

## 2022-05-29 ENCOUNTER — Other Ambulatory Visit: Payer: Self-pay

## 2022-05-29 ENCOUNTER — Emergency Department (HOSPITAL_BASED_OUTPATIENT_CLINIC_OR_DEPARTMENT_OTHER): Payer: Commercial Managed Care - HMO

## 2022-05-29 ENCOUNTER — Encounter (HOSPITAL_COMMUNITY): Payer: Self-pay

## 2022-05-29 DIAGNOSIS — I639 Cerebral infarction, unspecified: Secondary | ICD-10-CM

## 2022-05-29 DIAGNOSIS — K5900 Constipation, unspecified: Secondary | ICD-10-CM | POA: Diagnosis present

## 2022-05-29 DIAGNOSIS — G8194 Hemiplegia, unspecified affecting left nondominant side: Secondary | ICD-10-CM | POA: Diagnosis not present

## 2022-05-29 DIAGNOSIS — R42 Dizziness and giddiness: Secondary | ICD-10-CM

## 2022-05-29 DIAGNOSIS — Z794 Long term (current) use of insulin: Secondary | ICD-10-CM | POA: Diagnosis not present

## 2022-05-29 DIAGNOSIS — I129 Hypertensive chronic kidney disease with stage 1 through stage 4 chronic kidney disease, or unspecified chronic kidney disease: Secondary | ICD-10-CM | POA: Diagnosis present

## 2022-05-29 DIAGNOSIS — E785 Hyperlipidemia, unspecified: Secondary | ICD-10-CM | POA: Diagnosis not present

## 2022-05-29 DIAGNOSIS — E669 Obesity, unspecified: Secondary | ICD-10-CM | POA: Diagnosis present

## 2022-05-29 DIAGNOSIS — E1151 Type 2 diabetes mellitus with diabetic peripheral angiopathy without gangrene: Secondary | ICD-10-CM | POA: Diagnosis present

## 2022-05-29 DIAGNOSIS — Z79899 Other long term (current) drug therapy: Secondary | ICD-10-CM | POA: Diagnosis not present

## 2022-05-29 DIAGNOSIS — H532 Diplopia: Secondary | ICD-10-CM | POA: Diagnosis not present

## 2022-05-29 DIAGNOSIS — E1165 Type 2 diabetes mellitus with hyperglycemia: Secondary | ICD-10-CM | POA: Diagnosis not present

## 2022-05-29 DIAGNOSIS — E1122 Type 2 diabetes mellitus with diabetic chronic kidney disease: Secondary | ICD-10-CM | POA: Diagnosis not present

## 2022-05-29 DIAGNOSIS — N12 Tubulo-interstitial nephritis, not specified as acute or chronic: Secondary | ICD-10-CM

## 2022-05-29 DIAGNOSIS — Z825 Family history of asthma and other chronic lower respiratory diseases: Secondary | ICD-10-CM

## 2022-05-29 DIAGNOSIS — Z8572 Personal history of non-Hodgkin lymphomas: Secondary | ICD-10-CM | POA: Diagnosis not present

## 2022-05-29 DIAGNOSIS — Z7902 Long term (current) use of antithrombotics/antiplatelets: Secondary | ICD-10-CM

## 2022-05-29 DIAGNOSIS — E1169 Type 2 diabetes mellitus with other specified complication: Secondary | ICD-10-CM | POA: Diagnosis present

## 2022-05-29 DIAGNOSIS — I6381 Other cerebral infarction due to occlusion or stenosis of small artery: Principal | ICD-10-CM | POA: Diagnosis present

## 2022-05-29 DIAGNOSIS — Z1152 Encounter for screening for COVID-19: Secondary | ICD-10-CM

## 2022-05-29 DIAGNOSIS — F419 Anxiety disorder, unspecified: Secondary | ICD-10-CM | POA: Diagnosis present

## 2022-05-29 DIAGNOSIS — I3139 Other pericardial effusion (noninflammatory): Secondary | ICD-10-CM | POA: Diagnosis not present

## 2022-05-29 DIAGNOSIS — Z7985 Long-term (current) use of injectable non-insulin antidiabetic drugs: Secondary | ICD-10-CM

## 2022-05-29 DIAGNOSIS — R109 Unspecified abdominal pain: Secondary | ICD-10-CM

## 2022-05-29 DIAGNOSIS — Z90710 Acquired absence of both cervix and uterus: Secondary | ICD-10-CM

## 2022-05-29 DIAGNOSIS — K219 Gastro-esophageal reflux disease without esophagitis: Secondary | ICD-10-CM | POA: Diagnosis present

## 2022-05-29 DIAGNOSIS — R29707 NIHSS score 7: Secondary | ICD-10-CM | POA: Diagnosis not present

## 2022-05-29 DIAGNOSIS — Z856 Personal history of leukemia: Secondary | ICD-10-CM

## 2022-05-29 DIAGNOSIS — R590 Localized enlarged lymph nodes: Secondary | ICD-10-CM | POA: Diagnosis present

## 2022-05-29 DIAGNOSIS — R2681 Unsteadiness on feet: Secondary | ICD-10-CM | POA: Diagnosis not present

## 2022-05-29 DIAGNOSIS — Z6830 Body mass index (BMI) 30.0-30.9, adult: Secondary | ICD-10-CM

## 2022-05-29 DIAGNOSIS — R29701 NIHSS score 1: Secondary | ICD-10-CM | POA: Diagnosis not present

## 2022-05-29 DIAGNOSIS — Z833 Family history of diabetes mellitus: Secondary | ICD-10-CM

## 2022-05-29 DIAGNOSIS — N179 Acute kidney failure, unspecified: Secondary | ICD-10-CM | POA: Diagnosis not present

## 2022-05-29 DIAGNOSIS — Z823 Family history of stroke: Secondary | ICD-10-CM

## 2022-05-29 DIAGNOSIS — Z8249 Family history of ischemic heart disease and other diseases of the circulatory system: Secondary | ICD-10-CM

## 2022-05-29 DIAGNOSIS — Z800 Family history of malignant neoplasm of digestive organs: Secondary | ICD-10-CM

## 2022-05-29 DIAGNOSIS — K6289 Other specified diseases of anus and rectum: Secondary | ICD-10-CM

## 2022-05-29 DIAGNOSIS — I10 Essential (primary) hypertension: Secondary | ICD-10-CM | POA: Diagnosis not present

## 2022-05-29 DIAGNOSIS — E119 Type 2 diabetes mellitus without complications: Secondary | ICD-10-CM | POA: Diagnosis present

## 2022-05-29 DIAGNOSIS — R1013 Epigastric pain: Secondary | ICD-10-CM | POA: Diagnosis present

## 2022-05-29 DIAGNOSIS — N1831 Chronic kidney disease, stage 3a: Secondary | ICD-10-CM | POA: Diagnosis present

## 2022-05-29 DIAGNOSIS — Z8673 Personal history of transient ischemic attack (TIA), and cerebral infarction without residual deficits: Secondary | ICD-10-CM | POA: Diagnosis not present

## 2022-05-29 DIAGNOSIS — R1012 Left upper quadrant pain: Secondary | ICD-10-CM | POA: Diagnosis not present

## 2022-05-29 DIAGNOSIS — Z91148 Patient's other noncompliance with medication regimen for other reason: Secondary | ICD-10-CM

## 2022-05-29 DIAGNOSIS — R001 Bradycardia, unspecified: Secondary | ICD-10-CM | POA: Diagnosis present

## 2022-05-29 LAB — GLUCOSE, CAPILLARY: Glucose-Capillary: 327 mg/dL — ABNORMAL HIGH (ref 70–99)

## 2022-05-29 LAB — URINALYSIS, ROUTINE W REFLEX MICROSCOPIC
Bilirubin Urine: NEGATIVE
Glucose, UA: 250 mg/dL — AB
Ketones, ur: NEGATIVE mg/dL
Leukocytes,Ua: NEGATIVE
Nitrite: NEGATIVE
Protein, ur: >=300 mg/dL — AB
Specific Gravity, Urine: 1.015 (ref 1.005–1.030)
pH: 6.5 (ref 5.0–8.0)

## 2022-05-29 LAB — BASIC METABOLIC PANEL
Anion gap: 8 (ref 5–15)
BUN: 22 mg/dL (ref 8–23)
CO2: 25 mmol/L (ref 22–32)
Calcium: 9.6 mg/dL (ref 8.9–10.3)
Chloride: 103 mmol/L (ref 98–111)
Creatinine, Ser: 1.28 mg/dL — ABNORMAL HIGH (ref 0.44–1.00)
GFR, Estimated: 47 mL/min — ABNORMAL LOW (ref >60)
Glucose, Bld: 196 mg/dL — ABNORMAL HIGH (ref 70–99)
Potassium: 3.7 mmol/L (ref 3.5–5.1)
Sodium: 136 mmol/L (ref 135–145)

## 2022-05-29 LAB — RESP PANEL BY RT-PCR (RSV, FLU A&B, COVID)  RVPGX2
Influenza A by PCR: NEGATIVE
Influenza B by PCR: NEGATIVE
Resp Syncytial Virus by PCR: NEGATIVE
SARS Coronavirus 2 by RT PCR: NEGATIVE

## 2022-05-29 LAB — CBC
HCT: 36.8 % (ref 36.0–46.0)
Hemoglobin: 12.1 g/dL (ref 12.0–15.0)
MCH: 25.3 pg — ABNORMAL LOW (ref 26.0–34.0)
MCHC: 32.9 g/dL (ref 30.0–36.0)
MCV: 76.8 fL — ABNORMAL LOW (ref 80.0–100.0)
Platelets: 266 10*3/uL (ref 150–400)
RBC: 4.79 MIL/uL (ref 3.87–5.11)
RDW: 14.6 % (ref 11.5–15.5)
WBC: 5.9 10*3/uL (ref 4.0–10.5)
nRBC: 0 % (ref 0.0–0.2)

## 2022-05-29 LAB — TROPONIN I (HIGH SENSITIVITY)
Troponin I (High Sensitivity): 14 ng/L (ref <18)
Troponin I (High Sensitivity): 12 ng/L (ref <18)

## 2022-05-29 LAB — URINALYSIS, MICROSCOPIC (REFLEX)

## 2022-05-29 LAB — MAGNESIUM: Magnesium: 2.1 mg/dL (ref 1.7–2.4)

## 2022-05-29 MED ORDER — ATORVASTATIN CALCIUM 40 MG PO TABS
80.0000 mg | ORAL_TABLET | Freq: Every day | ORAL | Status: DC
  Administered 2022-05-30 – 2022-06-03 (×5): 80 mg via ORAL
  Filled 2022-05-29 (×5): qty 2

## 2022-05-29 MED ORDER — CILOSTAZOL 100 MG PO TABS
50.0000 mg | ORAL_TABLET | Freq: Two times a day (BID) | ORAL | Status: DC
  Administered 2022-05-30 – 2022-06-03 (×11): 50 mg via ORAL
  Filled 2022-05-29 (×13): qty 0.5

## 2022-05-29 MED ORDER — STROKE: EARLY STAGES OF RECOVERY BOOK
Freq: Once | Status: AC
  Filled 2022-05-29: qty 1

## 2022-05-29 MED ORDER — ACETAMINOPHEN 160 MG/5ML PO SOLN: 650.0000 mg | ORAL | Status: DC | PRN

## 2022-05-29 MED ORDER — ENOXAPARIN SODIUM 40 MG/0.4ML IJ SOSY
40.0000 mg | PREFILLED_SYRINGE | INTRAMUSCULAR | Status: DC
  Administered 2022-05-30 – 2022-06-03 (×5): 40 mg via SUBCUTANEOUS
  Filled 2022-05-29 (×5): qty 0.4

## 2022-05-29 MED ORDER — SODIUM CHLORIDE 0.9 % IV BOLUS
500.0000 mL | Freq: Once | INTRAVENOUS | Status: AC
  Administered 2022-05-29: 500 mL via INTRAVENOUS

## 2022-05-29 MED ORDER — INSULIN ASPART 100 UNIT/ML IJ SOLN
0.0000 [IU] | Freq: Three times a day (TID) | INTRAMUSCULAR | Status: DC
  Administered 2022-05-30: 5 [IU] via SUBCUTANEOUS
  Administered 2022-05-30: 3 [IU] via SUBCUTANEOUS
  Administered 2022-05-30: 5 [IU] via SUBCUTANEOUS
  Administered 2022-05-30: 8 [IU] via SUBCUTANEOUS
  Administered 2022-05-31: 5 [IU] via SUBCUTANEOUS
  Administered 2022-05-31: 3 [IU] via SUBCUTANEOUS
  Administered 2022-05-31: 11 [IU] via SUBCUTANEOUS
  Administered 2022-06-01 (×3): 3 [IU] via SUBCUTANEOUS
  Administered 2022-06-02: 5 [IU] via SUBCUTANEOUS
  Administered 2022-06-02: 2 [IU] via SUBCUTANEOUS
  Administered 2022-06-02: 5 [IU] via SUBCUTANEOUS
  Administered 2022-06-03: 3 [IU] via SUBCUTANEOUS
  Administered 2022-06-03: 5 [IU] via SUBCUTANEOUS
  Administered 2022-06-03: 3 [IU] via SUBCUTANEOUS
  Administered 2022-06-04: 2 [IU] via SUBCUTANEOUS

## 2022-05-29 MED ORDER — EZETIMIBE 10 MG PO TABS
10.0000 mg | ORAL_TABLET | Freq: Every day | ORAL | Status: DC
  Administered 2022-05-30 – 2022-06-03 (×5): 10 mg via ORAL
  Filled 2022-05-29 (×5): qty 1

## 2022-05-29 MED ORDER — ALUM & MAG HYDROXIDE-SIMETH 200-200-20 MG/5ML PO SUSP
30.0000 mL | Freq: Once | ORAL | Status: AC
  Administered 2022-05-29: 30 mL via ORAL
  Filled 2022-05-29: qty 30

## 2022-05-29 MED ORDER — ORAL CARE MOUTH RINSE: 15.0000 mL | OROMUCOSAL | Status: DC | PRN

## 2022-05-29 MED ORDER — CLOPIDOGREL BISULFATE 75 MG PO TABS
75.0000 mg | ORAL_TABLET | Freq: Every day | ORAL | Status: DC
  Administered 2022-05-30 – 2022-06-03 (×5): 75 mg via ORAL
  Filled 2022-05-29 (×5): qty 1

## 2022-05-29 MED ORDER — SODIUM CHLORIDE 0.9 % IV BOLUS
1000.0000 mL | Freq: Once | INTRAVENOUS | Status: AC
  Administered 2022-05-29: 1000 mL via INTRAVENOUS

## 2022-05-29 MED ORDER — IOHEXOL 350 MG/ML SOLN
75.0000 mL | Freq: Once | INTRAVENOUS | Status: AC | PRN
  Administered 2022-05-29: 75 mL via INTRAVENOUS

## 2022-05-29 MED ORDER — SODIUM CHLORIDE 0.9 % IV SOLN
2.0000 g | Freq: Once | INTRAVENOUS | Status: AC
  Administered 2022-05-29: 2 g via INTRAVENOUS
  Filled 2022-05-29: qty 20

## 2022-05-29 MED ORDER — ACETAMINOPHEN 650 MG RE SUPP: 650.0000 mg | RECTAL | Status: DC | PRN

## 2022-05-29 MED ORDER — ACETAMINOPHEN 325 MG PO TABS: 650.0000 mg | ORAL_TABLET | ORAL | Status: DC | PRN

## 2022-05-29 MED ORDER — FLUOXETINE HCL 20 MG PO CAPS
20.0000 mg | ORAL_CAPSULE | Freq: Every day | ORAL | Status: DC
  Administered 2022-05-30 – 2022-06-03 (×5): 20 mg via ORAL
  Filled 2022-05-29 (×5): qty 1

## 2022-05-29 MED ORDER — MECLIZINE HCL 25 MG PO TABS
25.0000 mg | ORAL_TABLET | Freq: Once | ORAL | Status: AC
  Administered 2022-05-29: 25 mg via ORAL
  Filled 2022-05-29: qty 1

## 2022-05-29 MED ORDER — INSULIN GLARGINE-YFGN 100 UNIT/ML ~~LOC~~ SOLN
10.0000 [IU] | Freq: Every day | SUBCUTANEOUS | Status: DC
  Administered 2022-05-30: 10 [IU] via SUBCUTANEOUS
  Filled 2022-05-29: qty 0.1

## 2022-05-29 MED ORDER — PANTOPRAZOLE SODIUM 40 MG PO TBEC
40.0000 mg | DELAYED_RELEASE_TABLET | Freq: Every day | ORAL | Status: DC
  Administered 2022-05-30 – 2022-06-03 (×5): 40 mg via ORAL
  Filled 2022-05-29 (×5): qty 1

## 2022-05-29 NOTE — ED Notes (Signed)
Ambulating: Pt ambulated from lowered stretcher to triage restroom, used the restroom x1 MIN assist, in NAD and with a good steady pace. Pt c/o the same lower leg weakness/knee shakiness that she complained of during orthostatic VS. Pt used walker (pt has walker and cane at home). EDP currently at bedside and aware. Family also at bedside.

## 2022-05-29 NOTE — ED Provider Notes (Signed)
[facility] Provider Note   CSN: [id number] Arrival date & time: 05/29/22  1106     History  Chief Complaint  Patient presents with   Dizziness    [name] is a 62 y.o. female, history of CVA, diabetes, hypertension, who presents to the ED secondary to feeling weak in bilateral legs, normal over since 9:30 AM today.  She also reports that with this weakness, she also had some epigastric discomfort, feels like a ball in her chest, she denies any shortness of breath.  Denies any ear pain, one-sided weakness, difficulty swallowing, confusion.  Does state that she feels numb all over, and then developed dizziness when her family member drove her here.  She describes the dizziness as feeling like she is on a boat, and that the world is spinning.  No hearing changes. No recent illnesses    Home Medications Prior to Admission medications   Medication Sig Start Date End Date Taking? Authorizing Provider  amLODipine (NORVASC) 10 MG tablet Take 1 tablet by mouth once daily 05/09/22   [name], [name], MD  atorvastatin (LIPITOR) 80 MG tablet Take 1 tablet (80 mg total) by mouth daily. 05/09/22   [name], [name], MD  B Complex Vitamins (VITAMIN B COMPLEX) TABS Take 1 tablet by mouth every morning.    [provider]  Blood Pressure Monitoring (BLOOD PRESSURE MONITOR AUTOMAT) DEVI 1 Device by Does not apply route daily. 08/29/18   [name], [name], MD  Cholecalciferol (VITAMIN D3 PO) Take 1 capsule by mouth every morning.    [provider]  cilostazol (PLETAL) 50 MG tablet Take 1 tablet by mouth twice daily 11/17/21   Lorretta Harp, MD  clopidogrel (PLAVIX) 75 MG tablet Take 1 tablet (75 mg total) by mouth daily. 05/09/22   [name], [name], MD  Dulaglutide (TRULICITY) 6.71 IW/5.8KD SOPN Inject 0.75 mg into the skin once a week. 04/13/22   Shamleffer, Melanie Crazier, MD  ezetimibe (ZETIA) 10 MG tablet Take 1 tablet (10 mg total) by  mouth daily. 05/09/22   [name], [name], MD  FLUoxetine (PROZAC) 20 MG capsule Take 1 capsule by mouth once daily 11/16/21   [name], [name], MD  gabapentin (NEURONTIN) 300 MG capsule Take 1 capsule (300 mg total) by mouth daily for 1 day, THEN 1 capsule (300 mg total) 2 (two) times daily for 1 day, THEN 1 capsule (300 mg total) 3 (three) times daily for 28 days. 01/20/22 03/11/22  Audley Hose, MD  glucose blood (CONTOUR NEXT TEST) test strip 1 each by Other route 2 (two) times daily. And lancets 2/day 03/09/18   [name], MD  glucose blood [facility] VERIO) test strip USE TO CHECK BLOOD SUGAR TWICE A DAY AND PRN 07/29/15   [name], MD  hydrALAZINE (APRESOLINE) 50 MG tablet TAKE 1 TABLET BY MOUTH THREE TIMES DAILY 11/16/21   [name], [name], MD  insulin glargine (LANTUS) 100 UNIT/ML Solostar Pen Inject 30 Units into the skin daily. And pen needles 1/day 04/13/22   Shamleffer, Melanie Crazier, MD  insulin lispro (HUMALOG KWIKPEN) 200 UNIT/ML KwikPen Inject 8 Units into the skin 3 (three) times daily before meals. 10 min before meals 04/13/22   Shamleffer, Melanie Crazier, MD  lisinopril-hydrochlorothiazide (ZESTORETIC) 20-12.5 MG tablet Take 2 tablets by mouth once daily 05/09/22   [name], [name], MD  metoprolol succinate (TOPROL-XL) 50 MG 24 hr tablet TAKE 1 TABLET BY MOUTH ONCE DAILY WITH  MEALS OR  IMMEDIATELY  FOLLOWING  A  MEAL Patient taking differently: 50 mg every morning. 09/01/21   [name], [name], MD  ONE TOUCH LANCETS MISC USE TO CHECK BLOOD SUGAR TWICE A DAY AND PRN 07/29/15   [name], MD  pantoprazole (PROTONIX) 40 MG tablet Take 1 tablet (40 mg total) by mouth daily. 05/09/22   [name], [name], MD      Allergies    Patient has no known allergies.    Review of Systems   Review of Systems  HENT:  Negative for ear pain.   Neurological:  Positive for dizziness and numbness. Negative for speech difficulty.    Physical Exam Updated Vital Signs BP (!) 181/100    Pulse 74   Temp 98.6 F (37 C) (Oral)   Resp 15   Ht '5\' 4"'$  (1.626 m)   Wt 80.7 kg   SpO2 98%   BMI 30.55 kg/m  Physical Exam Vitals and nursing note reviewed.  Constitutional:      General: She is not in acute distress.    Appearance: She is well-developed.  HENT:     Head: Normocephalic and atraumatic.  Eyes:     Conjunctiva/sclera: Conjunctivae normal.  Cardiovascular:     Rate and Rhythm: Normal rate and regular rhythm.     Heart sounds: No murmur heard. Pulmonary:     Effort: Pulmonary effort is normal. No respiratory distress.     Breath sounds: Normal breath sounds.  Abdominal:     Palpations: Abdomen is soft.     Tenderness: There is no abdominal tenderness.  Musculoskeletal:        General: No swelling.     Cervical back: Neck supple.     Comments: Bilateral upper and bilateral lower extremities 5 out of 5.  Skin:    General: Skin is warm and dry.     Capillary Refill: Capillary refill takes less than 2 seconds.  Neurological:     General: No focal deficit present.     Mental Status: She is alert.     Cranial Nerves: No cranial nerve deficit.     Sensory: No sensory deficit.     Motor: No weakness.     Coordination: Coordination normal.  Psychiatric:        Mood and Affect: Mood normal.     ED Results / Procedures / Treatments   Labs (all labs ordered are listed, but only abnormal results are displayed) Labs Reviewed  BASIC METABOLIC PANEL - Abnormal; Notable for the following components:      Result Value   Glucose, Bld 196 (*)    Creatinine, Ser 1.28 (*)    GFR, Estimated 47 (*)    All other components within normal limits  CBC - Abnormal; Notable for the following components:   MCV 76.8 (*)    MCH 25.3 (*)    All other components within normal limits  URINALYSIS, ROUTINE W REFLEX MICROSCOPIC - Abnormal; Notable for the following components:   Glucose, UA 250 (*)    Hgb urine dipstick MODERATE (*)    Protein, ur >=300 (*)    All other  components within normal limits  URINALYSIS, MICROSCOPIC (REFLEX) - Abnormal; Notable for the following components:   Bacteria, UA FEW (*)    All other components within normal limits  RESP PANEL BY RT-PCR (RSV, FLU A&B, COVID)  RVPGX2  MAGNESIUM  TSH  CBG MONITORING, ED  TROPONIN I (HIGH SENSITIVITY)  TROPONIN I (HIGH  SENSITIVITY)    EKG EKG Interpretation  Date/Time:  Sunday May 29 2022 11:27:27 EST Ventricular Rate:  54 PR Interval:  143 QRS Duration: 97 QT Interval:  416 QTC Calculation: 395 R Axis:   9 Text Interpretation: Sinus rhythm LVH with secondary repolarization abnormality Mild st depression lateral leads Nonspecific T wave inversion Lead II No significant change since last tracing Confirmed by Georgina Snell [phone]) on 05/29/2022 11:34:34 AM  Radiology CT Angio Chest/Abd/Pel for Dissection W and/or W/WO  Result Date: 05/29/2022 CLINICAL DATA:  numbness, tingling ,dizziness, hypertensive Radiologic records indicates history of CLL. EXAM: CT ANGIOGRAPHY CHEST, ABDOMEN AND PELVIS TECHNIQUE: Non-contrast CT of the chest was initially obtained. Multidetector CT imaging through the chest, abdomen and pelvis was performed using the standard protocol during bolus administration of intravenous contrast. Multiplanar reconstructed images and MIPs were obtained and reviewed to evaluate the vascular anatomy. RADIATION DOSE REDUCTION: This exam was performed according to the departmental dose-optimization program which includes automated exposure control, adjustment of the mA and/or kV according to patient size and/or use of iterative reconstruction technique. CONTRAST:  42m OMNIPAQUE IOHEXOL 350 MG/ML SOLN COMPARISON:  Chest radiograph earlier today. Chest, abdomen, pelvis CT 10/15/2019 FINDINGS: CTA CHEST FINDINGS Cardiovascular: No aortic hematoma on this unenhanced exam. Thoracic aorta is normal in caliber with mild tortuosity. Mild aortic atherosclerosis. No dissection,  aneurysm or acute aortic findings. Aberrant right subclavian artery. No central pulmonary embolus on this exam not tailored to pulmonary artery assessment. Borderline cardiomegaly. Minimal pericardial effusion. Mediastinum/Nodes: No enlarged mediastinal or hilar lymph nodes. There is bulky bilateral axillary adenopathy. Index right axillary node measures 2.1 cm short axis series 4, image 21. Left axillary node measures 2.5 cm short axis series 4, image 33. 11 mm left supraclavicular node is stable. Similar appearance of heterogeneous right lobe of the thyroid. No esophageal wall thickening. Lungs/Pleura: Minor atelectasis in the lingula. No other focal airspace disease. No pleural fluid. No features of pulmonary edema. No pulmonary mass or nodule. The trachea and central airways are patent. Musculoskeletal: Heterogeneous appearance of the osseous structures but no discrete focal lesion. No acute osseous findings. Review of the MIP images confirms the above findings. CTA ABDOMEN AND PELVIS FINDINGS VASCULAR Aorta: Moderate atherosclerosis. Normal in caliber without aneurysm, dissection, vasculitis or significant stenosis. Celiac: Mild plaque at the origin with minimal stenosis. Branch vessels are patent. No dissection or acute findings. SMA: Patent without evidence of aneurysm, dissection, vasculitis or significant stenosis. Renals: Single bilateral renal arteries. Mild calcified plaque at the origin of the both renal arteries with minimal stenosis. No dissection or acute findings. IMA: Patent without evidence of aneurysm, dissection, vasculitis or significant stenosis. Inflow: Moderate atherosclerosis. No aneurysm, dissection or acute findings. Veins: No obvious venous abnormality within the limitations of this arterial phase study. Review of the MIP images confirms the above findings. NON-VASCULAR Hepatobiliary: No focal hepatic abnormality on this arterial phase exam. Or gallbladder Pancreas: No ductal dilatation  or inflammation. Spleen: Normal in size and arterial enhancement. Adrenals/Urinary Tract: No adrenal nodule. Heterogeneous enhancement of both kidneys with mild perinephric edema. Loss of corticomedullary differentiation on the right. No frank hydronephrosis. 4.3 cm mildly complex cyst arising from the lower pole of the left kidney, suboptimally assessed on this arterial phase exam. Thin internal septations are seen. Mild bladder distension but no wall thickening. Stomach/Bowel: No bowel obstruction or inflammation. Scattered colonic diverticula without diverticulitis. Normal appendix. Lymphatic: Retroperitoneal adenopathy with progression from prior exam. Dominant node is at the right common  iliac station measuring 2.9 cm short axis series 4, image 143. This node previously measured 2.5 cm. Lymph node at the level of the upper right kidney measures 18 mm short axis series 4, image 88. Anterior periaortic node measures 2.1 cm series 4, image 103. There also enlarged peripancreatic and periportal nodes. Mesenteric adenopathy is suboptimally assessed on this arterial phase exam, but mesenteric lymph nodes have increased in size from prior. There also ileocolic nodes are enlarged. Bilateral pelvic adenopathy, 17 mm left external iliac node series 4, image 167, 14 mm right external iliac node series 4, image 166. Reproductive: Status post hysterectomy. No adnexal masses. Other: No ascites or free air. Kiya Eno fat containing umbilical hernia. Musculoskeletal: The bones are diffusely heterogeneous. No acute osseous finding. No discrete osseous lesion. Bilateral hip osteoarthritis. Review of the MIP images confirms the above findings. IMPRESSION: 1. No aortic dissection or acute aortic abnormality. Moderate aortic atherosclerosis. 2. Bulky bilateral axillary, mesenteric, and retroperitoneal adenopathy, consistent with history of CLL. Adenopathy is progressed from 2021 exam. 3. Heterogeneous enhancement of both kidneys with  mild perinephric edema, suspicious for pyelonephritis. Recommend correlation with urinalysis. 4. Colonic diverticulosis without diverticulitis. Aortic Atherosclerosis (ICD10-I70.0). Electronically Signed   By: Keith Rake M.D.   On: 05/29/2022 15:17   DG Chest 2 View  Result Date: 05/29/2022 CLINICAL DATA:  Dizziness. EXAM: CHEST - 2 VIEW COMPARISON:  Chest CT dated 10/15/2019 FINDINGS: Stable enlarged heart and tortuous and calcified thoracic aorta. Mild linear scarring in the lingula without significant change. Otherwise, clear lungs with normal vascularity. Mild thoracic spine degenerative changes. IMPRESSION: No acute abnormality. Stable cardiomegaly. Electronically Signed   By: Claudie Revering M.D.   On: 05/29/2022 12:29   CT Head Wo Contrast  Result Date: 05/29/2022 CLINICAL DATA:  Dizziness.  Numbness.  TIAs. EXAM: CT HEAD WITHOUT CONTRAST TECHNIQUE: Contiguous axial images were obtained from the base of the skull through the vertex without intravenous contrast. RADIATION DOSE REDUCTION: This exam was performed according to the departmental dose-optimization program which includes automated exposure control, adjustment of the mA and/or kV according to patient size and/or use of iterative reconstruction technique. COMPARISON:  09/15/2021 FINDINGS: Brain: No evidence of intracranial hemorrhage, acute infarction, hydrocephalus, extra-axial collection, or mass lesion/mass effect. Old lacunar infarct noted in left superior cerebellum. Vascular:  No hyperdense vessel or other acute findings. Skull: No evidence of fracture or other significant bone abnormality. Sinuses/Orbits:  No acute findings. Other: None. IMPRESSION: No acute intracranial abnormality. Electronically Signed   By: Marlaine Hind M.D.   On: 05/29/2022 12:23    Procedures Procedures   Medications Ordered in ED Medications  meclizine (ANTIVERT) tablet 25 mg (25 mg Oral Given 05/29/22 1138)  sodium chloride 0.9 % bolus 500 mL (0 mLs  Intravenous Stopped 05/29/22 1424)  alum & mag hydroxide-simeth (MAALOX/MYLANTA) 200-200-20 MG/5ML suspension 30 mL (30 mLs Oral Given 05/29/22 1250)  iohexol (OMNIPAQUE) 350 MG/ML injection 75 mL (75 mLs Intravenous Contrast Given 05/29/22 1435)  cefTRIAXone (ROCEPHIN) 2 g in sodium chloride 0.9 % 100 mL IVPB (0 g Intravenous Stopped 05/29/22 1859)  sodium chloride 0.9 % bolus 1,000 mL (0 mLs Intravenous Stopped 05/29/22 1900)    ED Course/ Medical Decision Making/ A&P Clinical Course as of 05/29/22 1916  Sun May 29, 2022  1244 GFR, Estimated(!): 47 [VB]    Clinical Course User Index [VB] Elgie Congo, MD  Medical Decision Making Patient is a 62 year old female, here for epigastric pain, and then weakness, and dizziness.  Discussed and staffed with Dr. Nechama Guard obtain TSH, mag, CBC, CMP, CT a dissection study given her epigastric pain, troponins EKG and CT head for further evaluation.  Amount and/or Complexity of Data Reviewed Labs: ordered. Decision-making details documented in ED Course.    Details: Positive for trace bacteria, leukocytes. Radiology: ordered.    Details: CTA dissection study concerning for possible pyelonephritis of bilateral kidneys.  Additionally no signs of dissection.  Minimal pericardial effusion Discussion of management or test interpretation with external provider(s): Discussed with Dr. Leonel Ramsay he recommends MRI brain given past history.  Discussed with Dr. Louanne Belton he accepts admission of patient for bilateral pyelonephritis, MRI/need for MRA given that he patient did not have CTA head/neck.    Risk OTC drugs. Prescription drug management. Decision regarding hospitalization.   Final Clinical Impression(s) / ED Diagnoses Final diagnoses:  Dizziness  Pyelonephritis  Epigastric pain    Rx / DC Orders ED Discharge Orders     None         Osvaldo Shipper, PA 05/29/22 1919    Elgie Congo, MD 05/30/22  1814

## 2022-05-29 NOTE — ED Notes (Signed)
[facility] called for tranpsort to [facility] for admission.

## 2022-05-29 NOTE — Assessment & Plan Note (Addendum)
-  pt presented with various non-specific symptoms but most concerning was symptoms of diplopia and lower extremity weakness worse on the left especially in setting of her hx of recurrent strokes. Diplopia resolved yesterday and LE weakness is gradually improving. -Neurology Dr. Leonel Ramsay has recommended continue workup with MRI brain/MRA head and neck  -obtain echocardiogram if positive for stroke -Continue Plavix monotherapy for now -Continue statin -Obtain A1c and lipids -PT/OT/SLT -Frequent neuro checks and keep on telemetry -Allow for permissive hypertension with blood pressure treatment as needed only if systolic goes above 379

## 2022-05-29 NOTE — Assessment & Plan Note (Signed)
-  Difficult to tease out whether symptoms started yesterday versus earlier today.  Will keep on permissive hypertension for now pending MRI imaging.

## 2022-05-29 NOTE — Assessment & Plan Note (Signed)
-  Uncontrolled with last A1c in 03/2022 of greater than 14 -Home regimen on 30 units of Lantus daily and 8 units of Humalog at mealtime -Start with 10 units long-acting here with moderate sliding scale insulin

## 2022-05-29 NOTE — Assessment & Plan Note (Signed)
Continue statin. 

## 2022-05-29 NOTE — ED Notes (Signed)
ED TO INPATIENT HANDOFF REPORT  ED Nurse Name and Phone #: Jacky Kindle  S Name/Age/Gender [name] 62 y.o. female Room/Bed: MH04/MH04  Code Status   Code Status: Prior  Home/SNF/Other Home Patient oriented to: self, place, time, and situation Is this baseline? Yes   Triage Complete: Triage complete  Chief Complaint Dizziness [R42]  Triage Note Pt arrives pov, to triage in wheelchair, c/o dizziness today with "numbness all over". Pt bilaterally equal, no deficits noted.    Allergies No Known Allergies  Level of Care/Admitting Diagnosis ED Disposition     ED Disposition  Admit   Condition  --   Comment  Hospital Area: [facility] [[id number]]  Level of Care: Telemetry [5]  Admit to tele based on following criteria: Monitor for Ischemic changes  Interfacility transfer: Yes  May place patient in observation at [facility] or [facility] if equivalent level of care is available:: No  Covid Evaluation: Asymptomatic - no recent exposure (last 10 days) testing not required  Diagnosis: Dizziness [[id number]]  Admitting Physician: Flora Lipps [[id number]]  Attending Physician: Flora Lipps [[id number]]          B Medical/Surgery History Past Medical History:  Diagnosis Date   Abnormality of gait 05/10/2010   BACK PAIN 11/14/2008   Class 1 obesity due to excess calories with body mass index (BMI) of 31.0 to 31.9 in adult 02/07/2021   DIABETES MELLITUS, TYPE II 07/15/2008   Diplopia 07/15/2008   ECZEMA, ATOPIC 04/03/2009   GERD (gastroesophageal reflux disease)    Guillain-Barre ([place]) 1988   HYPERLIPIDEMIA 03/06/2009   HYPERTENSION 07/15/2008   Stroke ([place]) 2010, 2011   x2    Vertebral artery stenosis    Past Surgical History:  Procedure Laterality Date   ABDOMINAL HYSTERECTOMY     DILATION AND CURETTAGE OF UTERUS     FOOT SURGERY     IR ANGIO INTRA EXTRACRAN SEL COM CAROTID INNOMINATE UNI L MOD SED  09/17/2021   IR ANGIO INTRA  EXTRACRAN SEL INTERNAL CAROTID UNI R MOD SED  09/17/2021   IR ANGIO VERTEBRAL SEL VERTEBRAL UNI R MOD SED  09/17/2021   IR US GUIDE VASC ACCESS RIGHT  09/17/2021     A IV Location/Drains/Wounds Patient Lines/Drains/Airways Status     Active Line/Drains/Airways     Name Placement date Placement time Site Days   Peripheral IV 05/29/22 18 G Left Antecubital 05/29/22  1139  Antecubital  less than 1            Intake/Output Last 24 hours  Intake/Output Summary (Last 24 hours) at 05/29/2022 1901 Last data filed at 05/29/2022 1900 Gross per 24 hour  Intake 1600.43 ml  Output 350 ml  Net 1250.43 ml    Labs/Imaging Results for orders placed or performed during the hospital encounter of 05/29/22 (from the past 48 hour(s))  Resp panel by RT-PCR (RSV, Flu A&B, Covid) Nasopharyngeal Swab     Status: None   Collection Time: 05/29/22 11:21 AM   Specimen: Nasopharyngeal Swab; Nasal Swab  Result Value Ref Range   SARS Coronavirus 2 by RT PCR NEGATIVE NEGATIVE    Comment: (NOTE) SARS-CoV-2 target nucleic acids are NOT DETECTED.  The SARS-CoV-2 RNA is generally detectable in upper respiratory specimens during the acute phase of infection. The lowest concentration of SARS-CoV-2 viral copies this assay can detect is 138 copies/mL. A negative result does not preclude SARS-Cov-2 infection and should not be used as the sole basis for treatment or other  patient management decisions. A negative result may occur with  improper specimen collection/handling, submission of specimen other than nasopharyngeal swab, presence of viral mutation(s) within the areas targeted by this assay, and inadequate number of viral copies(<138 copies/mL). A negative result must be combined with clinical observations, patient history, and epidemiological information. The expected result is Negative.  Fact Sheet for Patients:  EntrepreneurPulse.com.au  Fact Sheet for Healthcare Providers:   IncredibleEmployment.be  This test is no t yet approved or cleared by the [place] FDA and  has been authorized for detection and/or diagnosis of SARS-CoV-2 by FDA under an Emergency Use Authorization (EUA). This EUA will remain  in effect (meaning this test can be used) for the duration of the COVID-19 declaration under Section 564(b)(1) of the Act, 21 U.S.C.section 360bbb-3(b)(1), unless the authorization is terminated  or revoked sooner.       Influenza A by PCR NEGATIVE NEGATIVE   Influenza B by PCR NEGATIVE NEGATIVE    Comment: (NOTE) The Xpert Xpress SARS-CoV-2/FLU/RSV plus assay is intended as an aid in the diagnosis of influenza from Nasopharyngeal swab specimens and should not be used as a sole basis for treatment. Nasal washings and aspirates are unacceptable for Xpert Xpress SARS-CoV-2/FLU/RSV testing.  Fact Sheet for Patients: EntrepreneurPulse.com.au  Fact Sheet for Healthcare Providers: IncredibleEmployment.be  This test is not yet approved or cleared by the [place] FDA and has been authorized for detection and/or diagnosis of SARS-CoV-2 by FDA under an Emergency Use Authorization (EUA). This EUA will remain in effect (meaning this test can be used) for the duration of the COVID-19 declaration under Section 564(b)(1) of the Act, 21 U.S.C. section 360bbb-3(b)(1), unless the authorization is terminated or revoked.     Resp Syncytial Virus by PCR NEGATIVE NEGATIVE    Comment: (NOTE) Fact Sheet for Patients: EntrepreneurPulse.com.au  Fact Sheet for Healthcare Providers: IncredibleEmployment.be  This test is not yet approved or cleared by the [place] FDA and has been authorized for detection and/or diagnosis of SARS-CoV-2 by FDA under an Emergency Use Authorization (EUA). This EUA will remain in effect (meaning this test can be used) for the duration of  the COVID-19 declaration under Section 564(b)(1) of the Act, 21 U.S.C. section 360bbb-3(b)(1), unless the authorization is terminated or revoked.  Performed at [facility], [address] [place], [place] [address]   Basic metabolic panel     Status: Abnormal   Collection Time: 05/29/22 11:45 AM  Result Value Ref Range   Sodium 136 135 - 145 mmol/L   Potassium 3.7 3.5 - 5.1 mmol/L   Chloride 103 98 - 111 mmol/L   CO2 25 22 - 32 mmol/L   Glucose, Bld 196 (H) 70 - 99 mg/dL    Comment: Glucose reference range applies only to samples taken after fasting for at least 8 hours.   BUN 22 8 - 23 mg/dL   Creatinine, Ser 1.28 (H) 0.44 - 1.00 mg/dL   Calcium 9.6 8.9 - 10.3 mg/dL   GFR, Estimated 47 (L) >60 mL/min    Comment: (NOTE) Calculated using the CKD-EPI Creatinine Equation (2021)    Anion gap 8 5 - 15    Comment: Performed at [facility], [address] [place], [place] [address]  CBC     Status: Abnormal   Collection Time: 05/29/22 11:45 AM  Result Value Ref Range   WBC 5.9 4.0 - 10.5 K/uL   RBC 4.79 3.87 - 5.11 MIL/uL  Hemoglobin 12.1 12.0 - 15.0 g/dL   HCT 36.8 36.0 - 46.0 %   MCV 76.8 (L) 80.0 - 100.0 fL   MCH 25.3 (L) 26.0 - 34.0 pg   MCHC 32.9 30.0 - 36.0 g/dL   RDW 14.6 11.5 - 15.5 %   Platelets 266 150 - 400 K/uL   nRBC 0.0 0.0 - 0.2 %    Comment: Performed at [facility], [address] [place], [place] [address]  Magnesium     Status: None   Collection Time: 05/29/22 11:45 AM  Result Value Ref Range   Magnesium 2.1 1.7 - 2.4 mg/dL    Comment: Performed at [facility], [address] [place], [place] [address]  Troponin I (High Sensitivity)     Status: None   Collection Time: 05/29/22 11:45 AM  Result Value Ref Range   Troponin I (High Sensitivity) 14 <18 ng/L    Comment: (NOTE) Elevated high sensitivity troponin I (hsTnI) values and significant  changes across serial measurements may suggest ACS but many  other  chronic and acute conditions are known to elevate hsTnI results.  Refer to the "Links" section for chest pain algorithms and additional  guidance. Performed at [facility], [address] [place], [place] [address]   Troponin I (High Sensitivity)     Status: None   Collection Time: 05/29/22  1:33 PM  Result Value Ref Range   Troponin I (High Sensitivity) 12 <18 ng/L    Comment: (NOTE) Elevated high sensitivity troponin I (hsTnI) values and significant  changes across serial measurements may suggest ACS but many other  chronic and acute conditions are known to elevate hsTnI results.  Refer to the "Links" section for chest pain algorithms and additional  guidance. Performed at [facility], [address] [place], [place] [address]   Urinalysis, Routine w reflex microscopic -Urine, Clean Catch     Status: Abnormal   Collection Time: 05/29/22  4:21 PM  Result Value Ref Range   Color, Urine YELLOW YELLOW   APPearance CLEAR CLEAR   Specific Gravity, Urine 1.015 1.005 - 1.030   pH 6.5 5.0 - 8.0   Glucose, UA 250 (A) NEGATIVE mg/dL   Hgb urine dipstick MODERATE (A) NEGATIVE   Bilirubin Urine NEGATIVE NEGATIVE   Ketones, ur NEGATIVE NEGATIVE mg/dL   Protein, ur >=300 (A) NEGATIVE mg/dL   Nitrite NEGATIVE NEGATIVE   Leukocytes,Ua NEGATIVE NEGATIVE    Comment: Performed at [facility], [address] [place], [place] [address]  Urinalysis, Microscopic (reflex)     Status: Abnormal   Collection Time: 05/29/22  4:21 PM  Result Value Ref Range   RBC / HPF 11-20 0 - 5 RBC/hpf   WBC, UA 11-20 0 - 5 WBC/hpf   Bacteria, UA FEW (A) NONE SEEN   Squamous Epithelial / HPF 0-5 0 - 5 /HPF    Comment: Performed at [facility], [address] [place], [place] [address]   CT Angio Chest/Abd/Pel for Dissection W and/or W/WO  Result Date: 05/29/2022 CLINICAL DATA:  numbness, tingling ,dizziness, hypertensive Radiologic records indicates  history of CLL. EXAM: CT ANGIOGRAPHY CHEST, ABDOMEN AND PELVIS TECHNIQUE: Non-contrast CT of the chest was initially obtained. Multidetector CT imaging through the chest, abdomen and pelvis was performed using the standard protocol during bolus administration of intravenous contrast. Multiplanar reconstructed images and MIPs were obtained and reviewed to evaluate the  vascular anatomy. RADIATION DOSE REDUCTION: This exam was performed according to the departmental dose-optimization program which includes automated exposure control, adjustment of the mA and/or kV according to patient size and/or use of iterative reconstruction technique. CONTRAST:  7m OMNIPAQUE IOHEXOL 350 MG/ML SOLN COMPARISON:  Chest radiograph earlier today. Chest, abdomen, pelvis CT 10/15/2019 FINDINGS: CTA CHEST FINDINGS Cardiovascular: No aortic hematoma on this unenhanced exam. Thoracic aorta is normal in caliber with mild tortuosity. Mild aortic atherosclerosis. No dissection, aneurysm or acute aortic findings. Aberrant right subclavian artery. No central pulmonary embolus on this exam not tailored to pulmonary artery assessment. Borderline cardiomegaly. Minimal pericardial effusion. Mediastinum/Nodes: No enlarged mediastinal or hilar lymph nodes. There is bulky bilateral axillary adenopathy. Index right axillary node measures 2.1 cm short axis series 4, image 21. Left axillary node measures 2.5 cm short axis series 4, image 33. 11 mm left supraclavicular node is stable. Similar appearance of heterogeneous right lobe of the thyroid. No esophageal wall thickening. Lungs/Pleura: Minor atelectasis in the lingula. No other focal airspace disease. No pleural fluid. No features of pulmonary edema. No pulmonary mass or nodule. The trachea and central airways are patent. Musculoskeletal: Heterogeneous appearance of the osseous structures but no discrete focal lesion. No acute osseous findings. Review of the MIP images confirms the above findings.  CTA ABDOMEN AND PELVIS FINDINGS VASCULAR Aorta: Moderate atherosclerosis. Normal in caliber without aneurysm, dissection, vasculitis or significant stenosis. Celiac: Mild plaque at the origin with minimal stenosis. Branch vessels are patent. No dissection or acute findings. SMA: Patent without evidence of aneurysm, dissection, vasculitis or significant stenosis. Renals: Single bilateral renal arteries. Mild calcified plaque at the origin of the both renal arteries with minimal stenosis. No dissection or acute findings. IMA: Patent without evidence of aneurysm, dissection, vasculitis or significant stenosis. Inflow: Moderate atherosclerosis. No aneurysm, dissection or acute findings. Veins: No obvious venous abnormality within the limitations of this arterial phase study. Review of the MIP images confirms the above findings. NON-VASCULAR Hepatobiliary: No focal hepatic abnormality on this arterial phase exam. Or gallbladder Pancreas: No ductal dilatation or inflammation. Spleen: Normal in size and arterial enhancement. Adrenals/Urinary Tract: No adrenal nodule. Heterogeneous enhancement of both kidneys with mild perinephric edema. Loss of corticomedullary differentiation on the right. No frank hydronephrosis. 4.3 cm mildly complex cyst arising from the lower pole of the left kidney, suboptimally assessed on this arterial phase exam. Thin internal septations are seen. Mild bladder distension but no wall thickening. Stomach/Bowel: No bowel obstruction or inflammation. Scattered colonic diverticula without diverticulitis. Normal appendix. Lymphatic: Retroperitoneal adenopathy with progression from prior exam. Dominant node is at the right common iliac station measuring 2.9 cm short axis series 4, image 143. This node previously measured 2.5 cm. Lymph node at the level of the upper right kidney measures 18 mm short axis series 4, image 88. Anterior periaortic node measures 2.1 cm series 4, image 103. There also enlarged  peripancreatic and periportal nodes. Mesenteric adenopathy is suboptimally assessed on this arterial phase exam, but mesenteric lymph nodes have increased in size from prior. There also ileocolic nodes are enlarged. Bilateral pelvic adenopathy, 17 mm left external iliac node series 4, image 167, 14 mm right external iliac node series 4, image 166. Reproductive: Status post hysterectomy. No adnexal masses. Other: No ascites or free air. Small fat containing umbilical hernia. Musculoskeletal: The bones are diffusely heterogeneous. No acute osseous finding. No discrete osseous lesion. Bilateral hip osteoarthritis. Review of the MIP images confirms the above findings. IMPRESSION: 1. No aortic  dissection or acute aortic abnormality. Moderate aortic atherosclerosis. 2. Bulky bilateral axillary, mesenteric, and retroperitoneal adenopathy, consistent with history of CLL. Adenopathy is progressed from 2021 exam. 3. Heterogeneous enhancement of both kidneys with mild perinephric edema, suspicious for pyelonephritis. Recommend correlation with urinalysis. 4. Colonic diverticulosis without diverticulitis. Aortic Atherosclerosis (ICD10-I70.0). Electronically Signed   By: Keith Rake M.D.   On: 05/29/2022 15:17   DG Chest 2 View  Result Date: 05/29/2022 CLINICAL DATA:  Dizziness. EXAM: CHEST - 2 VIEW COMPARISON:  Chest CT dated 10/15/2019 FINDINGS: Stable enlarged heart and tortuous and calcified thoracic aorta. Mild linear scarring in the lingula without significant change. Otherwise, clear lungs with normal vascularity. Mild thoracic spine degenerative changes. IMPRESSION: No acute abnormality. Stable cardiomegaly. Electronically Signed   By: Claudie Revering M.D.   On: 05/29/2022 12:29   CT Head Wo Contrast  Result Date: 05/29/2022 CLINICAL DATA:  Dizziness.  Numbness.  TIAs. EXAM: CT HEAD WITHOUT CONTRAST TECHNIQUE: Contiguous axial images were obtained from the base of the skull through the vertex without  intravenous contrast. RADIATION DOSE REDUCTION: This exam was performed according to the departmental dose-optimization program which includes automated exposure control, adjustment of the mA and/or kV according to patient size and/or use of iterative reconstruction technique. COMPARISON:  09/15/2021 FINDINGS: Brain: No evidence of intracranial hemorrhage, acute infarction, hydrocephalus, extra-axial collection, or mass lesion/mass effect. Old lacunar infarct noted in left superior cerebellum. Vascular:  No hyperdense vessel or other acute findings. Skull: No evidence of fracture or other significant bone abnormality. Sinuses/Orbits:  No acute findings. Other: None. IMPRESSION: No acute intracranial abnormality. Electronically Signed   By: Marlaine Hind M.D.   On: 05/29/2022 12:23    Pending Labs Unresulted Labs (From admission, onward)     Start     Ordered   05/29/22 1132  TSH  Once,   URGENT        05/29/22 1131            Vitals/Pain Today's Vitals   05/29/22 1545 05/29/22 1556 05/29/22 1630 05/29/22 1636  BP:  (!) 183/93 (!) 181/100   Pulse: 79 74 74   Resp: '15 14 15   '$ Temp:  98.6 F (37 C)    TempSrc:  Oral    SpO2: 100% 97% 98%   Weight:  80.7 kg    Height:  '5\' 4"'$  (1.626 m)    PainSc:    0-No pain    Isolation Precautions No active isolations  Medications Medications  meclizine (ANTIVERT) tablet 25 mg (25 mg Oral Given 05/29/22 1138)  sodium chloride 0.9 % bolus 500 mL (0 mLs Intravenous Stopped 05/29/22 1424)  alum & mag hydroxide-simeth (MAALOX/MYLANTA) 200-200-20 MG/5ML suspension 30 mL (30 mLs Oral Given 05/29/22 1250)  iohexol (OMNIPAQUE) 350 MG/ML injection 75 mL (75 mLs Intravenous Contrast Given 05/29/22 1435)  cefTRIAXone (ROCEPHIN) 2 g in sodium chloride 0.9 % 100 mL IVPB (0 g Intravenous Stopped 05/29/22 1859)  sodium chloride 0.9 % bolus 1,000 mL (0 mLs Intravenous Stopped 05/29/22 1900)    Mobility walks     Focused Assessments   R Recommendations: See  Admitting Provider Note  Report given to:   Additional Notes: Pt is Alert & Oriented x 4 , Independent, Room air, NSR

## 2022-05-29 NOTE — H&P (Signed)
History and Physical    Patient: [name] ZOX:[id number] DOB: [birth year] DOA: 05/29/2022 DOS: the patient was seen and examined on 05/29/2022 PCP: [name], [name], MD  Patient coming from:  [facility] ED  Chief Complaint:  Chief Complaint  Patient presents with   Dizziness   HPI: Amar Keenum is a 62 y.o. female with medical history significant of non-Hodgkin's lymphoma, HTN, Guillain-Barre syndrome (1988), multiple strokes (2010,01/2021, 08/2021), uncontrolled Type 2 diabetes, HLD, PAD, CKD 3a who presents from outside ED with concerns of dizziness and bilateral lower extremity weakness.   Patient had brief episode of double vision last night but improved after eating so though maybe it was low blood sugar. Then she woke up this morning and felt generalized numbness throughout her body and then unsteady gait/bilateral lower extremity weakness when she was at [facility]. Normally ambulates with walker. On the ride to the ED, felt dizziness and a "knot" feeling beneath her left breast region that felt maybe like heartburn. This feeling was relief with GI cocktail in ED. Her generalized paresthesia sensation have also resolved. Only thing is slight heaviness to left lower extremity. She has hx of multiple strokes but no residual deficits. Reports compliance with her Plavix monotherapy.  Has felt nausea but no vomiting. Has constipation. No dysuria or back pain. No tobacco or alcohol use.   In the ED, she was afebrile, hypertensive with SBP up to 190s over 80s on room air.    CBC is unremarkable. BMP is unremarkable.  Creatinine 1.28 which is near baseline of 1.2-1.3.  Negative flu/COVID/RSV.  CT a chest/abdomen/pelvis showed possible bilateral pyelonephritis with mild.  Nephric stranding. However, UA shows negative leukocyte, negative nitrate, greater than 300 protein, moderate hemoglobin and few bacteria's.  CT head was negative.  She was started empirically on IV Rocephin.  Neurology Dr.  Leonel Ramsay was consulted by ED PA who recommended further workup with MRI brain, MRA head and neck due to her symptoms and history of recurrent strokes.  Patient was then transferred here to [facility] for the further management.   Review of Systems: As mentioned in the history of present illness. All other systems reviewed and are negative. Past Medical History:  Diagnosis Date   Abnormality of gait 05/10/2010   BACK PAIN 11/14/2008   Class 1 obesity due to excess calories with body mass index (BMI) of 31.0 to 31.9 in adult 02/07/2021   DIABETES MELLITUS, TYPE II 07/15/2008   Diplopia 07/15/2008   ECZEMA, ATOPIC 04/03/2009   GERD (gastroesophageal reflux disease)    Guillain-Barre ([place]) 1988   HYPERLIPIDEMIA 03/06/2009   HYPERTENSION 07/15/2008   Stroke ([place]) 2010, 2011   x2    Vertebral artery stenosis    Past Surgical History:  Procedure Laterality Date   ABDOMINAL HYSTERECTOMY     DILATION AND CURETTAGE OF UTERUS     FOOT SURGERY     IR ANGIO INTRA EXTRACRAN SEL COM CAROTID INNOMINATE UNI L MOD SED  09/17/2021   IR ANGIO INTRA EXTRACRAN SEL INTERNAL CAROTID UNI R MOD SED  09/17/2021   IR ANGIO VERTEBRAL SEL VERTEBRAL UNI R MOD SED  09/17/2021   IR US GUIDE VASC ACCESS RIGHT  09/17/2021   Social History:  reports that she has never smoked. She has never used smokeless tobacco. She reports that she does not drink alcohol and does not use drugs.  No Known Allergies  Family History  Problem Relation Age of Onset   Diabetes Sister    Asthma Other  Stroke Other    Hypertension Other    Diabetes Mother    Stroke Mother    Cancer Father        pt states hae had some kind of stomach cancer, ? stomach or colon   Breast cancer Neg Hx     Prior to Admission medications   Medication Sig Start Date End Date Taking? Authorizing Provider  amLODipine (NORVASC) 10 MG tablet Take 1 tablet by mouth once daily 05/09/22   [name], [name], MD  atorvastatin (LIPITOR) 80 MG tablet Take 1  tablet (80 mg total) by mouth daily. 05/09/22   [name], [name], MD  B Complex Vitamins (VITAMIN B COMPLEX) TABS Take 1 tablet by mouth every morning.    [provider]  Blood Pressure Monitoring (BLOOD PRESSURE MONITOR AUTOMAT) DEVI 1 Device by Does not apply route daily. 08/29/18   [name], [name], MD  Cholecalciferol (VITAMIN D3 PO) Take 1 capsule by mouth every morning.    [provider]  cilostazol (PLETAL) 50 MG tablet Take 1 tablet by mouth twice daily 11/17/21   Lorretta Harp, MD  clopidogrel (PLAVIX) 75 MG tablet Take 1 tablet (75 mg total) by mouth daily. 05/09/22   [name], [name], MD  Dulaglutide (TRULICITY) 6.16 WV/3.7TG SOPN Inject 0.75 mg into the skin once a week. 04/13/22   Shamleffer, Melanie Crazier, MD  ezetimibe (ZETIA) 10 MG tablet Take 1 tablet (10 mg total) by mouth daily. 05/09/22   [name], [name], MD  FLUoxetine (PROZAC) 20 MG capsule Take 1 capsule by mouth once daily 11/16/21   [name], [name], MD  gabapentin (NEURONTIN) 300 MG capsule Take 1 capsule (300 mg total) by mouth daily for 1 day, THEN 1 capsule (300 mg total) 2 (two) times daily for 1 day, THEN 1 capsule (300 mg total) 3 (three) times daily for 28 days. 01/20/22 03/11/22  Audley Hose, MD  glucose blood (CONTOUR NEXT TEST) test strip 1 each by Other route 2 (two) times daily. And lancets 2/day 03/09/18   [name], MD  glucose blood [facility] VERIO) test strip USE TO CHECK BLOOD SUGAR TWICE A DAY AND PRN 07/29/15   [name], MD  hydrALAZINE (APRESOLINE) 50 MG tablet TAKE 1 TABLET BY MOUTH THREE TIMES DAILY 11/16/21   [name], [name], MD  insulin glargine (LANTUS) 100 UNIT/ML Solostar Pen Inject 30 Units into the skin daily. And pen needles 1/day 04/13/22   Shamleffer, Melanie Crazier, MD  insulin lispro (HUMALOG KWIKPEN) 200 UNIT/ML KwikPen Inject 8 Units into the skin 3 (three) times daily before meals. 10 min before meals 04/13/22   Shamleffer, Melanie Crazier, MD   lisinopril-hydrochlorothiazide (ZESTORETIC) 20-12.5 MG tablet Take 2 tablets by mouth once daily 05/09/22   [name], [name], MD  metoprolol succinate (TOPROL-XL) 50 MG 24 hr tablet TAKE 1 TABLET BY MOUTH ONCE DAILY WITH MEALS OR  IMMEDIATELY  FOLLOWING  A  MEAL Patient taking differently: 50 mg every morning. 09/01/21   [name], [name], MD  ONE TOUCH LANCETS MISC USE TO CHECK BLOOD SUGAR TWICE A DAY AND PRN 07/29/15   [name], MD  pantoprazole (PROTONIX) 40 MG tablet Take 1 tablet (40 mg total) by mouth daily. 05/09/22   [name], [name], MD    Physical Exam: Vitals:   05/29/22 2100 05/29/22 2122 05/29/22 2122 05/29/22 2206  BP: (!) 134/56   (!) 179/91  Pulse:  82  75  Resp: 19 18  18  Temp:   98.3 F (36.8 C) 98.3 F (36.8 C)  TempSrc:   Oral Oral  SpO2:  100%  95%  Weight:    81.2 kg  Height:    '5\' 4"'$  (1.626 m)   Constitutional: NAD, calm, comfortable, elderly female laying in bed Eyes: lids and conjunctivae normal ENMT: Mucous membranes are moist.  Neck: normal, supple Respiratory: clear to auscultation bilaterally, no wheezing, no crackles. Normal respiratory effort. No accessory muscle use.  Cardiovascular: Regular rate and rhythm, no murmurs / rubs / gallops. No extremity edema. No carotid bruits.  Abdomen: Soft, non-tender, no tenderness, no tenderness beneath her left with she was symptomatic. Bowel sounds positive.  Musculoskeletal: no clubbing / cyanosis. No joint deformity upper and lower extremities. Good ROM, no contractures. Normal muscle tone.  Skin: no rashes, lesions, ulcers.  Neurologic: CN 2-12 grossly intact. Sensation intact and symmetric on extremities.  Strength 5/5 in all 4.  Equal bilateral handgrip.  Intact heel-to-shin.  No nystagmus with figure of H testing. Psychiatric: Normal judgment and insight. Alert and oriented x 3. Normal mood. Data Reviewed:  See HPI  Assessment and Plan: * Dizziness -pt presented with various non-specific symptoms but  most concerning was symptoms of diplopia and lower extremity weakness worse on the left especially in setting of her hx of recurrent strokes. Diplopia resolved yesterday and LE weakness is gradually improving. -Neurology Dr. Leonel Ramsay has recommended continue workup with MRI brain/MRA head and neck  -obtain echocardiogram if positive for stroke -Continue Plavix monotherapy for now -Continue statin -Obtain A1c and lipids -PT/OT/SLT -Frequent neuro checks and keep on telemetry -Allow for permissive hypertension with blood pressure treatment as needed only if systolic goes above 829  Abdominal pain -complained of tightness beneath her left breast/upper epigastric area that was relieved by GI cocktail.  Denies any dysuria, lower abdominal pain or CVA tenderness.  Also has no fever no leukocytosis.  CTA imaging was concerning for pyelonephritis and she was empirically given IV Rocephin in the ED. However, doubt she has pyelonephritis given negative UA and lack of symptoms.  Will discontinue antibiotics and continue to follow clinically.  Personal history of CLL (chronic lymphocytic leukemia) -Follows with oncology Dr. Irene Limbo -CT imaging today revealing progression of bulky and adenopathy of the axillary, mesenteric and retroperitoneal region compare to imaging in 2021. Recommend follow up with oncology outpatient.  DM (diabetes mellitus), type 2 with peripheral vascular complications ([place]) - Uncontrolled with last A1c in 03/2022 of greater than 14 -Home regimen on 30 units of Lantus daily and 8 units of Humalog at mealtime -Start with 10 units long-acting here with moderate sliding scale insulin  Essential hypertension - Difficult to tease out whether symptoms started yesterday versus earlier today.  Will keep on permissive hypertension for now pending MRI imaging.  Hyperlipidemia associated with type 2 diabetes mellitus ([place]) Continue statin      Advance Care Planning:   Code Status: Full  Code   Consults: none  Family Communication: none at bedside  Severity of Illness: The appropriate patient status for this patient is OBSERVATION. Observation status is judged to be reasonable and necessary in order to provide the required intensity of service to ensure the patient's safety. The patient's presenting symptoms, physical exam findings, and initial radiographic and laboratory data in the context of their medical condition is felt to place them at decreased risk for further clinical deterioration. Furthermore, it is anticipated that the patient will be medically stable for discharge from the hospital  within 2 midnights of admission.   Author: Orene Desanctis, DO 05/29/2022 11:01 PM  For on call review [email or web address].

## 2022-05-29 NOTE — ED Notes (Signed)
Pt OOB with assistance to the bathroom with a walker. Tolerated well. Pt back in stretcher and vital signs updated.

## 2022-05-29 NOTE — ED Triage Notes (Signed)
Pt arrives pov, to triage in wheelchair, c/o dizziness today with "numbness all over". Pt bilaterally equal, no deficits noted.

## 2022-05-29 NOTE — ED Notes (Signed)
Patient transported to CT 

## 2022-05-29 NOTE — Assessment & Plan Note (Signed)
-  Follows with oncology Dr. Irene Limbo -CT imaging today revealing progression of bulky and adenopathy of the axillary, mesenteric and retroperitoneal region compare to imaging in 2021. Recommend follow up with oncology outpatient.

## 2022-05-29 NOTE — Assessment & Plan Note (Signed)
-  complained of tightness beneath her left breast/upper epigastric area that was relieved by GI cocktail.  Denies any dysuria, lower abdominal pain or CVA tenderness.  Also has no fever no leukocytosis.  CTA imaging was concerning for pyelonephritis and she was empirically given IV Rocephin in the ED. However, doubt she has pyelonephritis given negative UA and lack of symptoms.  Will discontinue antibiotics and continue to follow clinically.

## 2022-05-30 ENCOUNTER — Observation Stay (HOSPITAL_COMMUNITY): Payer: Commercial Managed Care - HMO

## 2022-05-30 ENCOUNTER — Inpatient Hospital Stay (HOSPITAL_COMMUNITY): Payer: Commercial Managed Care - HMO

## 2022-05-30 DIAGNOSIS — I6381 Other cerebral infarction due to occlusion or stenosis of small artery: Secondary | ICD-10-CM | POA: Diagnosis present

## 2022-05-30 DIAGNOSIS — R3915 Urgency of urination: Secondary | ICD-10-CM | POA: Diagnosis present

## 2022-05-30 DIAGNOSIS — R471 Dysarthria and anarthria: Secondary | ICD-10-CM | POA: Diagnosis not present

## 2022-05-30 DIAGNOSIS — I129 Hypertensive chronic kidney disease with stage 1 through stage 4 chronic kidney disease, or unspecified chronic kidney disease: Secondary | ICD-10-CM | POA: Diagnosis present

## 2022-05-30 DIAGNOSIS — N179 Acute kidney failure, unspecified: Secondary | ICD-10-CM | POA: Diagnosis not present

## 2022-05-30 DIAGNOSIS — Z8673 Personal history of transient ischemic attack (TIA), and cerebral infarction without residual deficits: Secondary | ICD-10-CM | POA: Diagnosis not present

## 2022-05-30 DIAGNOSIS — Z1152 Encounter for screening for COVID-19: Secondary | ICD-10-CM | POA: Diagnosis not present

## 2022-05-30 DIAGNOSIS — R4701 Aphasia: Secondary | ICD-10-CM | POA: Diagnosis not present

## 2022-05-30 DIAGNOSIS — D649 Anemia, unspecified: Secondary | ICD-10-CM | POA: Diagnosis present

## 2022-05-30 DIAGNOSIS — R29707 NIHSS score 7: Secondary | ICD-10-CM | POA: Diagnosis not present

## 2022-05-30 DIAGNOSIS — Z809 Family history of malignant neoplasm, unspecified: Secondary | ICD-10-CM | POA: Diagnosis not present

## 2022-05-30 DIAGNOSIS — I69354 Hemiplegia and hemiparesis following cerebral infarction affecting left non-dominant side: Secondary | ICD-10-CM | POA: Diagnosis present

## 2022-05-30 DIAGNOSIS — E1169 Type 2 diabetes mellitus with other specified complication: Secondary | ICD-10-CM | POA: Diagnosis present

## 2022-05-30 DIAGNOSIS — K219 Gastro-esophageal reflux disease without esophagitis: Secondary | ICD-10-CM | POA: Diagnosis present

## 2022-05-30 DIAGNOSIS — N1831 Chronic kidney disease, stage 3a: Secondary | ICD-10-CM | POA: Diagnosis present

## 2022-05-30 DIAGNOSIS — H532 Diplopia: Secondary | ICD-10-CM | POA: Diagnosis present

## 2022-05-30 DIAGNOSIS — M25362 Other instability, left knee: Secondary | ICD-10-CM | POA: Diagnosis present

## 2022-05-30 DIAGNOSIS — Z823 Family history of stroke: Secondary | ICD-10-CM | POA: Diagnosis not present

## 2022-05-30 DIAGNOSIS — Z90710 Acquired absence of both cervix and uterus: Secondary | ICD-10-CM | POA: Diagnosis not present

## 2022-05-30 DIAGNOSIS — E1142 Type 2 diabetes mellitus with diabetic polyneuropathy: Secondary | ICD-10-CM | POA: Diagnosis present

## 2022-05-30 DIAGNOSIS — Z8249 Family history of ischemic heart disease and other diseases of the circulatory system: Secondary | ICD-10-CM | POA: Diagnosis not present

## 2022-05-30 DIAGNOSIS — N189 Chronic kidney disease, unspecified: Secondary | ICD-10-CM | POA: Diagnosis present

## 2022-05-30 DIAGNOSIS — R32 Unspecified urinary incontinence: Secondary | ICD-10-CM | POA: Diagnosis present

## 2022-05-30 DIAGNOSIS — E1165 Type 2 diabetes mellitus with hyperglycemia: Secondary | ICD-10-CM | POA: Diagnosis present

## 2022-05-30 DIAGNOSIS — G8194 Hemiplegia, unspecified affecting left nondominant side: Secondary | ICD-10-CM | POA: Diagnosis not present

## 2022-05-30 DIAGNOSIS — Z856 Personal history of leukemia: Secondary | ICD-10-CM | POA: Diagnosis not present

## 2022-05-30 DIAGNOSIS — R29701 NIHSS score 1: Secondary | ICD-10-CM | POA: Diagnosis present

## 2022-05-30 DIAGNOSIS — K5900 Constipation, unspecified: Secondary | ICD-10-CM | POA: Diagnosis present

## 2022-05-30 DIAGNOSIS — E1122 Type 2 diabetes mellitus with diabetic chronic kidney disease: Secondary | ICD-10-CM | POA: Diagnosis present

## 2022-05-30 DIAGNOSIS — E1151 Type 2 diabetes mellitus with diabetic peripheral angiopathy without gangrene: Secondary | ICD-10-CM | POA: Diagnosis not present

## 2022-05-30 DIAGNOSIS — I6389 Other cerebral infarction: Secondary | ICD-10-CM | POA: Diagnosis not present

## 2022-05-30 DIAGNOSIS — Z794 Long term (current) use of insulin: Secondary | ICD-10-CM | POA: Diagnosis not present

## 2022-05-30 DIAGNOSIS — E785 Hyperlipidemia, unspecified: Secondary | ICD-10-CM | POA: Diagnosis present

## 2022-05-30 DIAGNOSIS — Z825 Family history of asthma and other chronic lower respiratory diseases: Secondary | ICD-10-CM | POA: Diagnosis not present

## 2022-05-30 DIAGNOSIS — Z8572 Personal history of non-Hodgkin lymphomas: Secondary | ICD-10-CM | POA: Diagnosis not present

## 2022-05-30 DIAGNOSIS — R42 Dizziness and giddiness: Secondary | ICD-10-CM | POA: Diagnosis present

## 2022-05-30 DIAGNOSIS — Z833 Family history of diabetes mellitus: Secondary | ICD-10-CM | POA: Diagnosis not present

## 2022-05-30 DIAGNOSIS — Z79899 Other long term (current) drug therapy: Secondary | ICD-10-CM | POA: Diagnosis not present

## 2022-05-30 DIAGNOSIS — I639 Cerebral infarction, unspecified: Secondary | ICD-10-CM | POA: Diagnosis not present

## 2022-05-30 DIAGNOSIS — F32A Depression, unspecified: Secondary | ICD-10-CM | POA: Diagnosis present

## 2022-05-30 DIAGNOSIS — R1310 Dysphagia, unspecified: Secondary | ICD-10-CM | POA: Diagnosis not present

## 2022-05-30 DIAGNOSIS — I3139 Other pericardial effusion (noninflammatory): Secondary | ICD-10-CM | POA: Diagnosis present

## 2022-05-30 DIAGNOSIS — R1012 Left upper quadrant pain: Secondary | ICD-10-CM | POA: Diagnosis not present

## 2022-05-30 DIAGNOSIS — R2681 Unsteadiness on feet: Secondary | ICD-10-CM | POA: Diagnosis present

## 2022-05-30 DIAGNOSIS — J690 Pneumonitis due to inhalation of food and vomit: Secondary | ICD-10-CM | POA: Diagnosis not present

## 2022-05-30 DIAGNOSIS — E11649 Type 2 diabetes mellitus with hypoglycemia without coma: Secondary | ICD-10-CM | POA: Diagnosis present

## 2022-05-30 DIAGNOSIS — E669 Obesity, unspecified: Secondary | ICD-10-CM | POA: Diagnosis present

## 2022-05-30 DIAGNOSIS — F419 Anxiety disorder, unspecified: Secondary | ICD-10-CM | POA: Diagnosis present

## 2022-05-30 LAB — GLUCOSE, CAPILLARY
Glucose-Capillary: 242 mg/dL — ABNORMAL HIGH (ref 70–99)
Glucose-Capillary: 271 mg/dL — ABNORMAL HIGH (ref 70–99)
Glucose-Capillary: 194 mg/dL — ABNORMAL HIGH (ref 70–99)
Glucose-Capillary: 237 mg/dL — ABNORMAL HIGH (ref 70–99)

## 2022-05-30 LAB — LIPID PANEL
Cholesterol: 181 mg/dL (ref 0–200)
HDL: 37 mg/dL — ABNORMAL LOW (ref >40)
LDL Cholesterol: 123 mg/dL — ABNORMAL HIGH (ref 0–99)
Total CHOL/HDL Ratio: 4.9 RATIO
Triglycerides: 103 mg/dL (ref <150)
VLDL: 21 mg/dL (ref 0–40)

## 2022-05-30 LAB — HEMOGLOBIN A1C
Hgb A1c MFr Bld: 11.4 % — ABNORMAL HIGH (ref 4.8–5.6)
Mean Plasma Glucose: 280.48 mg/dL

## 2022-05-30 LAB — TSH: TSH: 0.487 u[IU]/mL (ref 0.350–4.500)

## 2022-05-30 MED ORDER — ALPRAZOLAM 0.25 MG PO TABS
0.2500 mg | ORAL_TABLET | Freq: Three times a day (TID) | ORAL | Status: DC | PRN
  Administered 2022-05-30: 0.25 mg via ORAL
  Filled 2022-05-30: qty 1

## 2022-05-30 MED ORDER — LABETALOL HCL 5 MG/ML IV SOLN: 10.0000 mg | INTRAVENOUS | Status: DC | PRN

## 2022-05-30 MED ORDER — LABETALOL HCL 5 MG/ML IV SOLN
10.0000 mg | INTRAVENOUS | Status: DC | PRN
  Administered 2022-05-30: 10 mg via INTRAVENOUS
  Filled 2022-05-30 (×2): qty 4

## 2022-05-30 MED ORDER — GADOBUTROL 1 MMOL/ML IV SOLN
8.0000 mL | Freq: Once | INTRAVENOUS | Status: AC | PRN
  Administered 2022-05-30: 8 mL via INTRAVENOUS

## 2022-05-30 MED ORDER — ONDANSETRON HCL 4 MG/2ML IJ SOLN
4.0000 mg | Freq: Once | INTRAMUSCULAR | Status: AC
  Administered 2022-05-30: 4 mg via INTRAVENOUS
  Filled 2022-05-30: qty 2

## 2022-05-30 MED ORDER — ASPIRIN 81 MG PO TBEC
81.0000 mg | DELAYED_RELEASE_TABLET | Freq: Every day | ORAL | Status: DC
  Administered 2022-05-30 – 2022-05-31 (×2): 81 mg via ORAL
  Filled 2022-05-30 (×2): qty 1

## 2022-05-30 MED ORDER — INSULIN GLARGINE-YFGN 100 UNIT/ML ~~LOC~~ SOLN
15.0000 [IU] | Freq: Every day | SUBCUTANEOUS | Status: DC
  Administered 2022-05-31 – 2022-06-01 (×2): 15 [IU] via SUBCUTANEOUS
  Filled 2022-05-30 (×2): qty 0.15

## 2022-05-30 MED ORDER — HYDROXYZINE HCL 25 MG PO TABS
25.0000 mg | ORAL_TABLET | Freq: Four times a day (QID) | ORAL | Status: DC | PRN
  Administered 2022-05-30: 25 mg via ORAL
  Filled 2022-05-30: qty 1

## 2022-05-30 MED ORDER — INSULIN ASPART 100 UNIT/ML IJ SOLN
4.0000 [IU] | Freq: Three times a day (TID) | INTRAMUSCULAR | Status: DC
  Administered 2022-05-30 – 2022-06-01 (×6): 4 [IU] via SUBCUTANEOUS

## 2022-05-30 NOTE — Progress Notes (Signed)
PROGRESS NOTE  [name]  PYP:[id number] DOB: May 07, 1960 DOA: 05/29/2022 PCP: [name], [name], MD   Brief Narrative: Patient is a 62 year old female with history of non-insulin lymphoma, Guillain-Barr syndrome, hypertension, multiple strokes, uncontrolled type 2 diabetes, hyperlipidemia, peripheral artery disease, CKD stage III who presents with complaints of dizziness bilateral lower extremity weakness, double vision, generalized numbness, unsteady gait.  She normally ambulates with help of walker.  History of multiple strokes with no residual deficits.  Currently on Plavix at home.  On presentation she was hypertensive.  Neurology workup was started.  Pending MRI, MRA head and neck.  PT recommending acute inpatient rehab  Assessment & Plan:  Principal Problem:   Dizziness Active Problems:   Hyperlipidemia associated with type 2 diabetes mellitus (HCC)   Essential hypertension   DM (diabetes mellitus), type 2 with peripheral vascular complications (HCC)   Personal history of CLL (chronic lymphocytic leukemia)   Abdominal pain   Dizziness: Presented with complaints of  dizziness bilateral lower extremity weakness, double vision, generalized numbness, unsteady gait.  Case was discussed with neurology.  Pending MRI of the brain, MRA head and neck.  CT head negative for any acute intracranial findings.  History of multiple strokes.  On Plavix at home. Lipid panel showed LDL of 123.   PT/OT consulted. She denies any dizziness this morning.  PT recommending acute inpatient rehab.  TOC consulted  Abdominal pain: Complain of tightness beneath her left breast/upper epigastric area that was relieved by GI cocktail.  No history of dysuria, lower abdominal pain.  Does not have CVA tenderness.  CT angiogram was suspicious pyelonephritis but patient does not have any signs of UTI.  UA was not suggestive.  Antibiotics discontinued. No abdominal pain today.  History of CLL: Follows with Dr. Irene Limbo.   CT imaging showed bulky adenopathy of the axillary, central and retroperitoneal region.  We recommend outpatient follow-up with oncology.  Diabetes type 2: Uncontrolled.  Last A1c of more than 11.  Continue current insulin regimen.  Monitor blood sugars.  Diabetic monitor consulted  Hypertension: Continue prn medications for severe hypertension pending MRI to confirm stroke.  Continue as needed medications for severe hypertension  Hyperlipidemia: On statin, maximum dose.  LDL of 123        DVT prophylaxis:enoxaparin (LOVENOX) injection 40 mg Start: 05/30/22 1000     Code Status: Full Code  Family Communication: None at the bedside  Patient status: Observation  Patient is from : Home  Anticipated discharge to: Acute inpatient rehab  Estimated DC date: After full workup   Consultants: None  Procedures: None  Antimicrobials:  Anti-infectives (From admission, onward)    Start     Dose/Rate Route Frequency Ordered Stop   05/29/22 1715  cefTRIAXone (ROCEPHIN) 2 g in sodium chloride 0.9 % 100 mL IVPB        2 g 200 mL/hr over 30 Minutes Intravenous  Once 05/29/22 1712 05/29/22 1859       Subjective: Patient seen and examined at bedside today.  Hemodynamically stable.  Lying in bed.  Denies any dizziness today.  No nausea or vomiting.  Denies abdominal pain.  Waiting for MRI  Objective: Vitals:   05/29/22 2122 05/29/22 2206 05/30/22 0150 05/30/22 0611  BP:  (!) 179/91 (!) 170/68 (!) 141/67  Pulse:  75 (!) 55 64  Resp:  '18 18 18  '$ Temp: 98.3 F (36.8 C) 98.3 F (36.8 C) 98.2 F (36.8 C) 97.9 F (36.6 C)  TempSrc: Oral Oral Oral  Oral  SpO2:  95% 98% 98%  Weight:  81.2 kg    Height:  '5\' 4"'$  (1.626 m)      Intake/Output Summary (Last 24 hours) at 05/30/2022 0803 Last data filed at 05/30/2022 0734 Gross per 24 hour  Intake 1600.43 ml  Output 1850 ml  Net -249.57 ml   Filed Weights   05/29/22 1556 05/29/22 2206  Weight: 80.7 kg 81.2 kg     Examination:  General exam: Overall comfortable, not in distress, obese HEENT: PERRL Respiratory system:  no wheezes or crackles  Cardiovascular system: S1 & S2 heard, RRR.  Gastrointestinal system: Abdomen is nondistended, soft and nontender. Central nervous system: Alert and oriented Extremities: No edema, no clubbing ,no cyanosis Skin: No rashes, no ulcers,no icterus     Data Reviewed: I have personally reviewed following labs and imaging studies  CBC: Recent Labs  Lab 05/29/22 1145  WBC 5.9  HGB 12.1  HCT 36.8  MCV 76.8*  PLT 448   Basic Metabolic Panel: Recent Labs  Lab 05/29/22 1145  NA 136  K 3.7  CL 103  CO2 25  GLUCOSE 196*  BUN 22  CREATININE 1.28*  CALCIUM 9.6  MG 2.1     Recent Results (from the past 240 hour(s))  Resp panel by RT-PCR (RSV, Flu A&B, Covid) Nasopharyngeal Swab     Status: None   Collection Time: 05/29/22 11:21 AM   Specimen: Nasopharyngeal Swab; Nasal Swab  Result Value Ref Range Status   SARS Coronavirus 2 by RT PCR NEGATIVE NEGATIVE Final    Comment: (NOTE) SARS-CoV-2 target nucleic acids are NOT DETECTED.  The SARS-CoV-2 RNA is generally detectable in upper respiratory specimens during the acute phase of infection. The lowest concentration of SARS-CoV-2 viral copies this assay can detect is 138 copies/mL. A negative result does not preclude SARS-Cov-2 infection and should not be used as the sole basis for treatment or other patient management decisions. A negative result may occur with  improper specimen collection/handling, submission of specimen other than nasopharyngeal swab, presence of viral mutation(s) within the areas targeted by this assay, and inadequate number of viral copies(<138 copies/mL). A negative result must be combined with clinical observations, patient history, and epidemiological information. The expected result is Negative.  Fact Sheet for Patients:   EntrepreneurPulse.com.au  Fact Sheet for Healthcare Providers:  IncredibleEmployment.be  This test is no t yet approved or cleared by the [place] FDA and  has been authorized for detection and/or diagnosis of SARS-CoV-2 by FDA under an Emergency Use Authorization (EUA). This EUA will remain  in effect (meaning this test can be used) for the duration of the COVID-19 declaration under Section 564(b)(1) of the Act, 21 U.S.C.section 360bbb-3(b)(1), unless the authorization is terminated  or revoked sooner.       Influenza A by PCR NEGATIVE NEGATIVE Final   Influenza B by PCR NEGATIVE NEGATIVE Final    Comment: (NOTE) The Xpert Xpress SARS-CoV-2/FLU/RSV plus assay is intended as an aid in the diagnosis of influenza from Nasopharyngeal swab specimens and should not be used as a sole basis for treatment. Nasal washings and aspirates are unacceptable for Xpert Xpress SARS-CoV-2/FLU/RSV testing.  Fact Sheet for Patients: EntrepreneurPulse.com.au  Fact Sheet for Healthcare Providers: IncredibleEmployment.be  This test is not yet approved or cleared by the [place] FDA and has been authorized for detection and/or diagnosis of SARS-CoV-2 by FDA under an Emergency Use Authorization (EUA). This EUA will remain in effect (meaning this test can be  used) for the duration of the COVID-19 declaration under Section 564(b)(1) of the Act, 21 U.S.C. section 360bbb-3(b)(1), unless the authorization is terminated or revoked.     Resp Syncytial Virus by PCR NEGATIVE NEGATIVE Final    Comment: (NOTE) Fact Sheet for Patients: EntrepreneurPulse.com.au  Fact Sheet for Healthcare Providers: IncredibleEmployment.be  This test is not yet approved or cleared by the [place] FDA and has been authorized for detection and/or diagnosis of SARS-CoV-2 by FDA under an Emergency Use  Authorization (EUA). This EUA will remain in effect (meaning this test can be used) for the duration of the COVID-19 declaration under Section 564(b)(1) of the Act, 21 U.S.C. section 360bbb-3(b)(1), unless the authorization is terminated or revoked.  Performed at [facility], [address], [place],  [address]      Radiology Studies: CT Angio Chest/Abd/Pel for Dissection W and/or W/WO  Result Date: 05/29/2022 CLINICAL DATA:  numbness, tingling ,dizziness, hypertensive Radiologic records indicates history of CLL. EXAM: CT ANGIOGRAPHY CHEST, ABDOMEN AND PELVIS TECHNIQUE: Non-contrast CT of the chest was initially obtained. Multidetector CT imaging through the chest, abdomen and pelvis was performed using the standard protocol during bolus administration of intravenous contrast. Multiplanar reconstructed images and MIPs were obtained and reviewed to evaluate the vascular anatomy. RADIATION DOSE REDUCTION: This exam was performed according to the departmental dose-optimization program which includes automated exposure control, adjustment of the mA and/or kV according to patient size and/or use of iterative reconstruction technique. CONTRAST:  29m OMNIPAQUE IOHEXOL 350 MG/ML SOLN COMPARISON:  Chest radiograph earlier today. Chest, abdomen, pelvis CT 10/15/2019 FINDINGS: CTA CHEST FINDINGS Cardiovascular: No aortic hematoma on this unenhanced exam. Thoracic aorta is normal in caliber with mild tortuosity. Mild aortic atherosclerosis. No dissection, aneurysm or acute aortic findings. Aberrant right subclavian artery. No central pulmonary embolus on this exam not tailored to pulmonary artery assessment. Borderline cardiomegaly. Minimal pericardial effusion. Mediastinum/Nodes: No enlarged mediastinal or hilar lymph nodes. There is bulky bilateral axillary adenopathy. Index right axillary node measures 2.1 cm short axis series 4, image 21. Left axillary node measures 2.5 cm short axis series  4, image 33. 11 mm left supraclavicular node is stable. Similar appearance of heterogeneous right lobe of the thyroid. No esophageal wall thickening. Lungs/Pleura: Minor atelectasis in the lingula. No other focal airspace disease. No pleural fluid. No features of pulmonary edema. No pulmonary mass or nodule. The trachea and central airways are patent. Musculoskeletal: Heterogeneous appearance of the osseous structures but no discrete focal lesion. No acute osseous findings. Review of the MIP images confirms the above findings. CTA ABDOMEN AND PELVIS FINDINGS VASCULAR Aorta: Moderate atherosclerosis. Normal in caliber without aneurysm, dissection, vasculitis or significant stenosis. Celiac: Mild plaque at the origin with minimal stenosis. Branch vessels are patent. No dissection or acute findings. SMA: Patent without evidence of aneurysm, dissection, vasculitis or significant stenosis. Renals: Single bilateral renal arteries. Mild calcified plaque at the origin of the both renal arteries with minimal stenosis. No dissection or acute findings. IMA: Patent without evidence of aneurysm, dissection, vasculitis or significant stenosis. Inflow: Moderate atherosclerosis. No aneurysm, dissection or acute findings. Veins: No obvious venous abnormality within the limitations of this arterial phase study. Review of the MIP images confirms the above findings. NON-VASCULAR Hepatobiliary: No focal hepatic abnormality on this arterial phase exam. Or gallbladder Pancreas: No ductal dilatation or inflammation. Spleen: Normal in size and arterial enhancement. Adrenals/Urinary Tract: No adrenal nodule. Heterogeneous enhancement of both kidneys with mild perinephric edema. Loss of corticomedullary  differentiation on the right. No frank hydronephrosis. 4.3 cm mildly complex cyst arising from the lower pole of the left kidney, suboptimally assessed on this arterial phase exam. Thin internal septations are seen. Mild bladder distension but  no wall thickening. Stomach/Bowel: No bowel obstruction or inflammation. Scattered colonic diverticula without diverticulitis. Normal appendix. Lymphatic: Retroperitoneal adenopathy with progression from prior exam. Dominant node is at the right common iliac station measuring 2.9 cm short axis series 4, image 143. This node previously measured 2.5 cm. Lymph node at the level of the upper right kidney measures 18 mm short axis series 4, image 88. Anterior periaortic node measures 2.1 cm series 4, image 103. There also enlarged peripancreatic and periportal nodes. Mesenteric adenopathy is suboptimally assessed on this arterial phase exam, but mesenteric lymph nodes have increased in size from prior. There also ileocolic nodes are enlarged. Bilateral pelvic adenopathy, 17 mm left external iliac node series 4, image 167, 14 mm right external iliac node series 4, image 166. Reproductive: Status post hysterectomy. No adnexal masses. Other: No ascites or free air. Small fat containing umbilical hernia. Musculoskeletal: The bones are diffusely heterogeneous. No acute osseous finding. No discrete osseous lesion. Bilateral hip osteoarthritis. Review of the MIP images confirms the above findings. IMPRESSION: 1. No aortic dissection or acute aortic abnormality. Moderate aortic atherosclerosis. 2. Bulky bilateral axillary, mesenteric, and retroperitoneal adenopathy, consistent with history of CLL. Adenopathy is progressed from 2021 exam. 3. Heterogeneous enhancement of both kidneys with mild perinephric edema, suspicious for pyelonephritis. Recommend correlation with urinalysis. 4. Colonic diverticulosis without diverticulitis. Aortic Atherosclerosis (ICD10-I70.0). Electronically Signed   By: Keith Rake M.D.   On: 05/29/2022 15:17   DG Chest 2 View  Result Date: 05/29/2022 CLINICAL DATA:  Dizziness. EXAM: CHEST - 2 VIEW COMPARISON:  Chest CT dated 10/15/2019 FINDINGS: Stable enlarged heart and tortuous and calcified  thoracic aorta. Mild linear scarring in the lingula without significant change. Otherwise, clear lungs with normal vascularity. Mild thoracic spine degenerative changes. IMPRESSION: No acute abnormality. Stable cardiomegaly. Electronically Signed   By: Claudie Revering M.D.   On: 05/29/2022 12:29   CT Head Wo Contrast  Result Date: 05/29/2022 CLINICAL DATA:  Dizziness.  Numbness.  TIAs. EXAM: CT HEAD WITHOUT CONTRAST TECHNIQUE: Contiguous axial images were obtained from the base of the skull through the vertex without intravenous contrast. RADIATION DOSE REDUCTION: This exam was performed according to the departmental dose-optimization program which includes automated exposure control, adjustment of the mA and/or kV according to patient size and/or use of iterative reconstruction technique. COMPARISON:  09/15/2021 FINDINGS: Brain: No evidence of intracranial hemorrhage, acute infarction, hydrocephalus, extra-axial collection, or mass lesion/mass effect. Old lacunar infarct noted in left superior cerebellum. Vascular:  No hyperdense vessel or other acute findings. Skull: No evidence of fracture or other significant bone abnormality. Sinuses/Orbits:  No acute findings. Other: None. IMPRESSION: No acute intracranial abnormality. Electronically Signed   By: Marlaine Hind M.D.   On: 05/29/2022 12:23    Scheduled Meds:   stroke: early stages of recovery book   Does not apply Once   atorvastatin  80 mg Oral Daily   cilostazol  50 mg Oral BID   clopidogrel  75 mg Oral Daily   enoxaparin (LOVENOX) injection  40 mg Subcutaneous Q24H   ezetimibe  10 mg Oral Daily   FLUoxetine  20 mg Oral Daily   insulin aspart  0-15 Units Subcutaneous TID PC & HS   insulin glargine-yfgn  10 Units Subcutaneous Daily  pantoprazole  40 mg Oral Daily   Continuous Infusions:   LOS: 0 days   Shelly Coss, MD [facility] P1/29/2024, 8:03 AM

## 2022-05-30 NOTE — Progress Notes (Signed)
Inpatient Rehab Admissions Coordinator:  ? ?Per therapy recommendations,  patient was screened for CIR candidacy by Junko Ohagan, MS, CCC-SLP. At this time, Pt. Appears to be a a potential candidate for CIR. I will place   order for rehab consult per protocol for full assessment. Please contact me any with questions. ? ?Baillie Mohammad, MS, CCC-SLP ?Rehab Admissions Coordinator  ?[phone] (celll) ?[phone] (office) ? ?

## 2022-05-30 NOTE — Evaluation (Signed)
Physical Therapy Evaluation Patient Details Name: [name] MRN: [id number] DOB: 1960/05/04 Today's Date: 05/30/2022  History of Present Illness  [name] is a 62 y.o. female with medical history significant of non-Hodgkin's lymphoma, HTN, Guillain-Barre syndrome (1988), multiple strokes (2010,01/2021, 08/2021), uncontrolled Type 2 diabetes, HLD, PAD, CKD 3a who presents from outside ED with concerns of dizziness and bilateral lower extremity weakness.  Clinical Impression  Pt presents with increasing L sided weakness and decreased mobility secondary to the above diagnosis. MRI pending. Pt was able to sit EOB with min guard assist, stood at bedside for BP check with min assist, pt with decreased balance requiring min assist. Pt reported her left knee was weak and she wanted to sit back down. Pt lives alone and may need CIR prior to d/c. Pt demonstrated dec motor control and left sided weakness. Pt will continue to benefit from acute skilled PT to maximize mobility and Independence for the next venue of care.     Recommendations for follow up therapy are one component of a multi-disciplinary discharge planning process, led by the attending physician.  Recommendations may be updated based on patient status, additional functional criteria and insurance authorization.  Follow Up Recommendations Acute inpatient rehab (3hours/day)      Assistance Recommended at Discharge Intermittent Supervision/Assistance  Patient can return home with the following  A little help with walking and/or transfers;Assist for transportation;Help with stairs or ramp for entrance    Equipment Recommendations None recommended by PT  Recommendations for Other Services       Functional Status Assessment Patient has had a recent decline in their functional status and demonstrates the ability to make significant improvements in function in a reasonable and predictable amount of time.     Precautions /  Restrictions Precautions Precautions: Fall Restrictions Weight Bearing Restrictions: No      Mobility  Bed Mobility Overal bed mobility: Needs Assistance Bed Mobility: Supine to Sit, Sit to Supine     Supine to sit: Min guard, HOB elevated     General bed mobility comments: increased time and effort    Transfers Overall transfer level: Needs assistance Equipment used: Rolling walker (2 wheels) Transfers: Sit to/from Stand Sit to Stand: Min assist           General transfer comment: cues for hand placement, and balance support, took orthostatic vitals standing in front of the bed    Ambulation/Gait               General Gait Details: NT due to high BP with standing and L LE weakness  Stairs            Wheelchair Mobility    Modified Rankin (Stroke Patients Only)       Balance Overall balance assessment: Needs assistance Sitting-balance support: No upper extremity supported Sitting balance-Leahy Scale: Fair     Standing balance support: Bilateral upper extremity supported Standing balance-Leahy Scale: Poor Standing balance comment: weight sifted over R LE due to L LE weakness                             Pertinent Vitals/Pain Pain Assessment Pain Assessment: No/denies pain    Home Living Family/patient expects to be discharged to:: Private residence Living Arrangements: Alone Available Help at Discharge: Family Type of Home: Apartment Home Access: Stairs to enter   Technical brewer of Steps: 3   Home Layout: One level Home Equipment: Conservation officer, nature (2  wheels)      Prior Function Prior Level of Function : Independent/Modified Independent                     Hand Dominance        Extremity/Trunk Assessment        Lower Extremity Assessment Lower Extremity Assessment: LLE deficits/detail LLE Deficits / Details: dec motor control, strength 4/5 LLE Sensation: decreased light touch LLE Coordination:  decreased fine motor       Communication   Communication: No difficulties  Cognition Arousal/Alertness: Awake/alert Behavior During Therapy: WFL for tasks assessed/performed Overall Cognitive Status: Within Functional Limits for tasks assessed                                          General Comments      Exercises     Assessment/Plan    PT Assessment Patient needs continued PT services  PT Problem List Decreased strength;Decreased balance;Decreased mobility;Decreased activity tolerance;Decreased coordination;Impaired sensation       PT Treatment Interventions DME instruction;Functional mobility training;Balance training;Patient/family education;Gait training;Therapeutic activities;Neuromuscular re-education;Therapeutic exercise    PT Goals (Current goals can be found in the Care Plan section)  Acute Rehab PT Goals Patient Stated Goal: to feel better PT Goal Formulation: With patient Time For Goal Achievement: 06/13/22 Potential to Achieve Goals: Good    Frequency Min 3X/week     Co-evaluation               AM-PAC PT "6 Clicks" Mobility  Outcome Measure Help needed turning from your back to your side while in a flat bed without using bedrails?: None Help needed moving from lying on your back to sitting on the side of a flat bed without using bedrails?: A Little Help needed moving to and from a bed to a chair (including a wheelchair)?: A Little Help needed standing up from a chair using your arms (e.g., wheelchair or bedside chair)?: A Little Help needed to walk in hospital room?: A Little Help needed climbing 3-5 steps with a railing? : A Little 6 Click Score: 19    End of Session Equipment Utilized During Treatment: Gait belt Activity Tolerance: Patient limited by fatigue Patient left: in bed;with bed alarm set Nurse Communication: Mobility status PT Visit Diagnosis: Hemiplegia and hemiparesis;Difficulty in walking, not elsewhere  classified (R26.2) Hemiplegia - Right/Left: Left Hemiplegia - dominant/non-dominant: Non-dominant    Time: 8099-8338 PT Time Calculation (min) (ACUTE ONLY): 31 min   Charges:   PT Evaluation $PT Eval Moderate Complexity: 1 Mod PT Treatments $Therapeutic Activity: 8-22 mins         Lelon Mast 05/30/2022, 9:45 AM

## 2022-05-30 NOTE — Progress Notes (Signed)
Patient has some increased weakness to the left side since last NIH. New NIH of 7. BP is 173/84.. prn labetalol given. MD notified of increased weakness to the left side. MD aware, no new orders received.

## 2022-05-30 NOTE — Progress Notes (Signed)
Overnight   NAME: [name] MRN: [id number] DOB : April 01, 1961    Date of Service   05/30/2022   HPI/Events of Note   Recommendations:  Notified by RN for concern over change in NIH on assessment. RR RN at bedside reassessed NIH at score of "6"  Previous was "4" during the day and "7" at initial RN contact.  Reviewed case and Imaging with Dr Curly Shores.   Will use recommendations from Dr, Curly Shores and Neuro NP from today's assessment     Previously admitted for stroke workup  Suggested to allow Permissive HTN x48 hrs from sx onset goal BP <220/110. PRN labetalol or hydralazine if BP above these parameters.     and to avvoid oral antihypertensives. -adjusted Labetalol PRN for Neuro recommendation of BP allowance  Previously added : -Aspirin 81 mg daily and Plavix 75 mg daily antiplt/anticoag    -Hydroxyzine for anxiety if needed.    Pt was seen by NP/Neuro during day rounds . Follow up will be by Neuro Physician    Interventions/ Plan   Allow Permissive HTN per Neurology q4 hr neuro checks STAT head CT for any change in neuro exam Telemetry      Gershon Cull BSN MSNA MSN [place]

## 2022-05-30 NOTE — Inpatient Diabetes Management (Signed)
Inpatient Diabetes Program Recommendations  AACE/ADA: New Consensus Statement on Inpatient Glycemic Control (2015)  Target Ranges:  Prepandial:   less than 140 mg/dL      Peak postprandial:   less than 180 mg/dL (1-2 hours)      Critically ill patients:  140 - 180 mg/dL    Latest Reference Range & Units 09/16/21 02:48 03/16/22 15:04 05/30/22 05:41  Hemoglobin A1C 4.8 - 5.6 % 12.9 (H) >14.0 (H) 11.4 (H)   (H): Data is abnormally high  Latest Reference Range & Units 05/29/22 23:06 05/30/22 08:31 05/30/22 11:31  Glucose-Capillary 70 - 99 mg/dL 327 (H) 242 (H)  5 units Novolog  271 (H)  (H): Data is abnormally high  Admit with: Dizziness, bilateral lower extremity weakness, double vision, generalized numbness, unsteady gait.   History: DM, Non-Hodgkin's lymphoma, Multiple strokes (2010,01/2021, 08/2021), CKD 3   Home DM Meds: Trulicity 2.95 mg qweek        Lantus 30 units Daily        Humalog 8 units TID with meals  Current Orders: Semglee 10 units Daily      Novolog Moderate Correction Scale/ SSI (0-15 units) TID AC + HS    Note Novolog and Semglee Insulins both started this AM    MD- Note CBG 271 at 12pm.  Please consider:  1. Increase Semglee to 15 units Daily (50% home dose)  2. Start Novolog Meal Coverage: Novolog 4 units TID with meals (50% home dose) HOLD if pt NPO HOLD if pt eats <50% meals   ENDO: Dr. Kelton Pillar Last seen 04/13/2022 Was told to take the following: MEDICATIONS: Increase Lantus 30 units daily Increase Humalog 8 units 3 times daily before every meal Start Trulicity 1.88 mg once weekly    Addendum 12:30pm--Met w/ pt (sister also present) at bedside.  Pt told me she has increased her Lantus and Humalog insulins at home as recommended by her ENDO.  Has NOT started taking the Trulicity yet--Has the Trulicity at home but just has not yet started it due to fear of the side effects--We reviewed the potential side effects and I encouraged pt to please  starting taking the Trulicity at home so that we can try to get her CBGs lower at home and get her A1c down to a safe level.  Discussed with pt that is she has any of the side effects from the Trulicity that are unbearable, to please call her ENDO and discuss different options--Pt agreeable.  Pt has CBG meter at home and has all insulins.  Pt did tell me that she gets her insulins from a pharmacy program and that the program sends the insulin to her PCP--Is unsure if she correctly re-enrolled in the program--Stated to me the program sends her insulin to her PCP office.  Strongly encouraged pt to call her PCP office today or tomorrow to find out the status of her insulin shipment and the insulin program since she has very little insulin at home.  Pt agreeable--Pt has follow up appt with ENDO in May 2024.  We reviewed her current A1c and her goal A1c and goal CBgs for home.  Pt appreciative of visit and did not have any questions for me at this time.   --Will follow patient during hospitalization--  Wyn Quaker RN, MSN, [place] Diabetes Coordinator Inpatient Glycemic Control Team Team Pager: [phone] (8a-5p)

## 2022-05-30 NOTE — Evaluation (Signed)
Occupational Therapy Evaluation Patient Details Name: [name] MRN: [id number] DOB: 11-07-1960 Today's Date: 05/30/2022   History of Present Illness Patient is a 62 year old female who presented with BLE weakness and dizziness. Patient was admitted with dizziness, abdominal pain, PMH: CLL, DM II, HTN, hyperlipidemia, CVA,guillain-Barre,diplopia,   Clinical Impression   Patient is a 62 year old female who was living at home  alone prior level. Currently, patient needed mod A for standing balance with attempts to engage in ADL tasks with poor awareness of L side decreased strength and motor deficits. Patient was noted to have decreased functional activity tolernace, decreased ROM, decreased BUE strength, decreased endurance, decreased sitting balance, decreased standing balanced, decreased safety awareness, and decreased knowledge of AE/AD impacting participation in ADLs. Patient would continue to benefit from skilled OT services at this time while admitted and after d/c to address noted deficits in order to improve overall safety and independence in ADLs.       Recommendations for follow up therapy are one component of a multi-disciplinary discharge planning process, led by the attending physician.  Recommendations may be updated based on patient status, additional functional criteria and insurance authorization.   Follow Up Recommendations  Acute inpatient rehab (3hours/day)     Assistance Recommended at Discharge Frequent or constant Supervision/Assistance  Patient can return home with the following Two people to help with walking and/or transfers;A lot of help with bathing/dressing/bathroom;Assistance with cooking/housework;Direct supervision/assist for medications management;Assist for transportation;Help with stairs or ramp for entrance;Direct supervision/assist for financial management    Functional Status Assessment  Patient has had a recent decline in their functional status and  demonstrates the ability to make significant improvements in function in a reasonable and predictable amount of time.  Equipment Recommendations  Other (comment)    Recommendations for Other Services       Precautions / Restrictions Precautions Precautions: Fall Restrictions Weight Bearing Restrictions: No      Mobility Bed Mobility Overal bed mobility: Needs Assistance Bed Mobility: Supine to Sit, Sit to Supine     Supine to sit: Min guard, HOB elevated Sit to supine: Min guard   General bed mobility comments: increased time and effort          Balance Overall balance assessment: Needs assistance Sitting-balance support: No upper extremity supported Sitting balance-Leahy Scale: Fair     Standing balance support: Bilateral upper extremity supported Standing balance-Leahy Scale: Poor           ADL either performed or assessed with clinical judgement   ADL Overall ADL's : Needs assistance/impaired Eating/Feeding: Sitting Eating/Feeding Details (indicate cue type and reason): EOB with set up opening small containers. Grooming: Sitting;Minimal assistance   Upper Body Bathing: Moderate assistance;Sitting   Lower Body Bathing: Moderate assistance;Sit to/from stand;Sitting/lateral leans   Upper Body Dressing : Sitting;Minimal assistance   Lower Body Dressing: Moderate assistance;Sit to/from stand;Sitting/lateral leans Lower Body Dressing Details (indicate cue type and reason): to don undergarments with noted buckling of LLE with standing. patient appeared to have decreased awareness of deficits. Toilet Transfer: Maximal assistance;Rolling walker (2 wheels);Squat-pivot Armed forces technical officer Details (indicate cue type and reason): patient was able to scoot up to [facility] to R side with increased time. Toileting- Clothing Manipulation and Hygiene: Total assistance;Sit to/from stand               Vision   Vision Assessment?: No apparent visual deficits             Pertinent Vitals/Pain Pain Assessment  Pain Assessment: Faces Faces Pain Scale: Hurts a little bit Pain Location: discomfort on R side and back Pain Descriptors / Indicators: Discomfort Pain Intervention(s): Monitored during session, Limited activity within patient's tolerance     Hand Dominance Right   Extremity/Trunk Assessment Upper Extremity Assessment Upper Extremity Assessment: LUE deficits/detail LUE Deficits / Details: noted to have decreased strength compared to RUE. unable to AROM FF with ABDuction to touch head. patient was able to participate in AAROM to about 80 degrees with resistance noted with attempted FF. patient unable to participate in finger opposition on digits. able to grossly grasp objects. LUE Coordination: decreased gross motor;decreased fine motor   Lower Extremity Assessment Lower Extremity Assessment: Defer to PT evaluation   Cervical / Trunk Assessment Cervical / Trunk Assessment: Normal   Communication Communication Communication: No difficulties   Cognition Arousal/Alertness: Awake/alert Behavior During Therapy: WFL for tasks assessed/performed Overall Cognitive Status: Within Functional Limits for tasks assessed                    Home Living Family/patient expects to be discharged to:: Private residence Living Arrangements: Alone Available Help at Discharge: Family Type of Home: Apartment Home Access: Stairs to enter Technical brewer of Steps: 3 Entrance Stairs-Rails: None Home Layout: One level     Bathroom Shower/Tub: Corporate investment banker: Standard     Home Equipment: Conservation officer, nature (2 wheels)   Additional Comments: No falls last 6 months      Prior Functioning/Environment Prior Level of Function : Independent/Modified Independent             Mobility Comments: walks with a cane          OT Problem List: Decreased activity tolerance;Impaired balance (sitting and/or  standing);Decreased coordination;Decreased safety awareness;Decreased knowledge of precautions;Decreased knowledge of use of DME or AE;Impaired UE functional use      OT Treatment/Interventions: Self-care/ADL training;Energy conservation;Therapeutic exercise;DME and/or AE instruction;Therapeutic activities;Patient/family education;Balance training    OT Goals(Current goals can be found in the care plan section) Acute Rehab OT Goals Patient Stated Goal: to get LUE and LLE back to the way they were OT Goal Formulation: With patient Time For Goal Achievement: 06/13/22 Potential to Achieve Goals: Fair  OT Frequency: Min 2X/week       AM-PAC OT "6 Clicks" Daily Activity     Outcome Measure Help from another person eating meals?: A Little Help from another person taking care of personal grooming?: A Little Help from another person toileting, which includes using toliet, bedpan, or urinal?: A Lot Help from another person bathing (including washing, rinsing, drying)?: A Lot Help from another person to put on and taking off regular upper body clothing?: A Lot Help from another person to put on and taking off regular lower body clothing?: A Lot 6 Click Score: 14   End of Session Equipment Utilized During Treatment: Gait belt;Rolling walker (2 wheels) Nurse Communication: Mobility status  Activity Tolerance: Patient tolerated treatment well Patient left: in bed;with call bell/phone within reach;with bed alarm set;with family/visitor present  OT Visit Diagnosis: Unsteadiness on feet (R26.81);Other abnormalities of gait and mobility (R26.89);Muscle weakness (generalized) (M62.81)                Time: 4742-5956 OT Time Calculation (min): 22 min Charges:  OT General Charges $OT Visit: 1 Visit OT Evaluation $OT Eval Moderate Complexity: 1 Mod  Angelia Hazell OTR/L, MS Acute Rehabilitation Department Office# [phone]   Willa Rough 05/30/2022, 4:35 PM

## 2022-05-30 NOTE — Consult Note (Signed)
Neurology Consultation  Reason for Consult: Right medullary stroke Referring Physician: Dr. Tawanna Solo  CC: Dizziness, gait disturbance and unusual sensation throughout body  History is obtained from: Patient and chart  HPI: [name] is a 62 y.o. female with history of non-Hodgkin's lymphoma, Guillain-Barr syndrome, hypertension, strokes, type 2 diabetes, hyperlipidemia, peripheral artery disease and CKD stage III who presents with an odd feeling throughout her body, dizziness, gait disturbance and transient diplopia.  Patient states that symptoms occurred on Sunday morning when she awoke and that she had hoped they would improve during the day but they did not, so she sought medical care.  MRI reveals new right medullary stroke.  Patient states that she has had no recent illnesses but endorses an odd feeling throughout her body which prevents her from resting, as well as intermittent constipation.  Unfortunately over the course of the day at [facility] long her symptoms have been worsening   LKW: Saturday night TNK given?: no, outside of window IR Thrombectomy? No, no LVO Modified Rankin Scale: 2-Slight disability-UNABLE to perform all activities but does not need assistance  ROS: A complete ROS was performed and is negative except as noted in the HPI.   Past Medical History:  Diagnosis Date   Abnormality of gait 05/10/2010   BACK PAIN 11/14/2008   Class 1 obesity due to excess calories with body mass index (BMI) of 31.0 to 31.9 in adult 02/07/2021   DIABETES MELLITUS, TYPE II 07/15/2008   Diplopia 07/15/2008   ECZEMA, ATOPIC 04/03/2009   GERD (gastroesophageal reflux disease)    Guillain-Barre ([place]) 1988   HYPERLIPIDEMIA 03/06/2009   HYPERTENSION 07/15/2008   Stroke ([place]) 2010, 2011   x2    Vertebral artery stenosis    Past Surgical History:  Procedure Laterality Date   ABDOMINAL HYSTERECTOMY     DILATION AND CURETTAGE OF UTERUS     FOOT SURGERY     IR ANGIO INTRA  EXTRACRAN SEL COM CAROTID INNOMINATE UNI L MOD SED  09/17/2021   IR ANGIO INTRA EXTRACRAN SEL INTERNAL CAROTID UNI R MOD SED  09/17/2021   IR ANGIO VERTEBRAL SEL VERTEBRAL UNI R MOD SED  09/17/2021   IR US GUIDE VASC ACCESS RIGHT  09/17/2021   Current Outpatient Medications  Medication Instructions   amLODipine (NORVASC) 10 MG tablet Take 1 tablet by mouth once daily   atorvastatin (LIPITOR) 80 mg, Oral, Daily   B Complex Vitamins (VITAMIN B COMPLEX) TABS 1 tablet, Oral, Every morning   Blood Pressure Monitoring (BLOOD PRESSURE MONITOR AUTOMAT) DEVI 1 Device, Does not apply, Daily   Cholecalciferol (VITAMIN D3 PO) 1 capsule, Oral, Every morning   cilostazol (PLETAL) 50 mg, Oral, 2 times daily   clopidogrel (PLAVIX) 75 mg, Oral, Daily   ezetimibe (ZETIA) 10 mg, Oral, Daily   FLUoxetine (PROZAC) 20 MG capsule Take 1 capsule by mouth once daily   gabapentin (NEURONTIN) 300 MG capsule Take 1 capsule (300 mg total) by mouth daily for 1 day, THEN 1 capsule (300 mg total) 2 (two) times daily for 1 day, THEN 1 capsule (300 mg total) 3 (three) times daily for 28 days.   glucose blood (CONTOUR NEXT TEST) test strip 1 each, Other, 2 times daily, And lancets 2/day   glucose blood (ONETOUCH VERIO) test strip USE TO CHECK BLOOD SUGAR TWICE A DAY AND PRN   HumaLOG KwikPen 8 Units, Subcutaneous, 3 times daily before meals, 10 min before meals   hydrALAZINE (APRESOLINE) 50 mg, Oral, 3 times daily  insulin glargine (LANTUS) 30 Units, Subcutaneous, Daily, And pen needles 1/day   lisinopril-hydrochlorothiazide (ZESTORETIC) 20-12.5 MG tablet Take 2 tablets by mouth once daily   metoprolol succinate (TOPROL-XL) 50 MG 24 hr tablet TAKE 1 TABLET BY MOUTH ONCE DAILY WITH MEALS OR  IMMEDIATELY  FOLLOWING  A  MEAL   ONE TOUCH LANCETS MISC USE TO CHECK BLOOD SUGAR TWICE A DAY AND PRN   pantoprazole (PROTONIX) 40 mg, Oral, Daily   Trulicity 3.41 mg, Subcutaneous, Weekly      Family History  Problem Relation Age of  Onset   Diabetes Sister    Asthma Other    Stroke Other    Hypertension Other    Diabetes Mother    Stroke Mother    Cancer Father        pt states hae had some kind of stomach cancer, ? stomach or colon   Breast cancer Neg Hx      Social History:   reports that she has never smoked. She has never used smokeless tobacco. She reports that she does not drink alcohol and does not use drugs.  Medications  Current Facility-Administered Medications:    acetaminophen (TYLENOL) tablet 650 mg, 650 mg, Oral, Q4H PRN **OR** acetaminophen (TYLENOL) 160 MG/5ML solution 650 mg, 650 mg, Per Tube, Q4H PRN **OR** acetaminophen (TYLENOL) suppository 650 mg, 650 mg, Rectal, Q4H PRN, Tu, Ching T, DO   aspirin EC tablet 81 mg, 81 mg, Oral, Daily, Adhikari, Amrit, MD   atorvastatin (LIPITOR) tablet 80 mg, 80 mg, Oral, Daily, Tu, Ching T, DO, 80 mg at 05/30/22 0851   cilostazol (PLETAL) tablet 50 mg, 50 mg, Oral, BID, Tu, Ching T, DO, 50 mg at 05/30/22 0851   clopidogrel (PLAVIX) tablet 75 mg, 75 mg, Oral, Daily, Tu, Ching T, DO, 75 mg at 05/30/22 0851   enoxaparin (LOVENOX) injection 40 mg, 40 mg, Subcutaneous, Q24H, Tu, Ching T, DO, 40 mg at 05/30/22 0853   ezetimibe (ZETIA) tablet 10 mg, 10 mg, Oral, Daily, Tu, Ching T, DO, 10 mg at 05/30/22 0851   FLUoxetine (PROZAC) capsule 20 mg, 20 mg, Oral, Daily, Tu, Ching T, DO, 20 mg at 05/30/22 0851   insulin aspart (novoLOG) injection 0-15 Units, 0-15 Units, Subcutaneous, TID PC & HS, Tu, Ching T, DO, 8 Units at 05/30/22 1240   insulin aspart (novoLOG) injection 4 Units, 4 Units, Subcutaneous, TID WC, Adhikari, Amrit, MD   [START ON 05/31/2022] insulin glargine-yfgn (SEMGLEE) injection 15 Units, 15 Units, Subcutaneous, Daily, Adhikari, Amrit, MD   labetalol (NORMODYNE) injection 10 mg, 10 mg, Intravenous, Q2H PRN, Shelly Coss, MD   Oral care mouth rinse, 15 mL, Mouth Rinse, PRN, Tu, Ching T, DO   pantoprazole (PROTONIX) EC tablet 40 mg, 40 mg, Oral, Daily,  Tu, Ching T, DO, 40 mg at 05/30/22 0851   Exam: Current vital signs: BP (!) 153/82 (BP Location: Right Arm)   Pulse 84   Temp 98.1 F (36.7 C) (Oral)   Resp 20   Ht '5\' 4"'$  (1.626 m)   Wt 81.2 kg   SpO2 97%   BMI 30.73 kg/m  Vital signs in last 24 hours: Temp:  [97.9 F (36.6 C)-98.6 F (37 C)] 98.1 F (36.7 C) (01/29 1012) Pulse Rate:  [42-146] 84 (01/29 1242) Resp:  [8-27] 20 (01/29 1012) BP: (134-199)/(56-109) 153/82 (01/29 1242) SpO2:  [85 %-100 %] 97 % (01/29 1012) Weight:  [80.7 kg-81.2 kg] 81.2 kg (01/28 2206)  GENERAL: Awake, alert, tearful and in mild  distress Psych: Affect appropriate for situation, patient is calm and cooperative with examination Head: Normocephalic and atraumatic, without obvious abnormality EENT: Normal conjunctivae, moist mucous membranes, no OP obstruction LUNGS: Normal respiratory effort. Non-labored breathing on room air Extremities: warm, well perfused, without obvious deformity  NEURO:  Mental Status: Awake, alert, and oriented to person, place, time, and situation. He/She is able to provide a clear and coherent history of present illness. Speech/Language: speech is clear and fluent.   No neglect is noted Cranial Nerves:  II: PERRL visual fields full.  III, IV, VI: EOMI. Lid elevation symmetric and full.  V: Sensation is intact to light touch and symmetrical to face.  VII: Face is symmetric resting and smiling.  VIII: Hearing intact to voice IX, X: Phonation normal.  XI: Normal sternocleidomastoid and trapezius muscle strength XII: Tongue protrudes slightly to the left without fasciculations.   Motor: 5/5 strength is all muscle groups.  Tone is normal. Bulk is normal.  Sensation: Intact to light touch bilaterally in all four extremities. No extinction to DSS present.  Coordination: FTN with ataxia in left hand. No pronator drift.  Gait: Deferred Reflexes: 3+ bilateral brachioradialis, 2+ bilateral patella  NIHSS: 1a Level of  Conscious.: 0 1b LOC Questions: 0 1c LOC Commands: 0 2 Best Gaze: 0 3 Visual: 0 4 Facial Palsy: 0 5a Motor Arm - left: 0 5b Motor Arm - Right: 0 6a Motor Leg - Left: 0 6b Motor Leg - Right: 0 7 Limb Ataxia: 1 8 Sensory: 0 9 Best Language: 0 10 Dysarthria: 0 11 Extinct. and Inatten.: 0 TOTAL: 1  On later attending evaluation at approximately 1 AM, patient has an NIH score of 7 (3 points for left lower extremity weakness, 3 points for left upper extremity weakness, 1-point for dysarthria).  She did also complain of some mild double vision when looking to the left  Labs I have reviewed labs in epic and the results pertinent to this consultation are:   CBC    Component Value Date/Time   WBC 5.9 05/29/2022 1145   RBC 4.79 05/29/2022 1145   HGB 12.1 05/29/2022 1145   HGB 11.5 (L) 03/11/2022 1248   HCT 36.8 05/29/2022 1145   PLT 266 05/29/2022 1145   PLT 256 03/11/2022 1248   MCV 76.8 (L) 05/29/2022 1145   MCH 25.3 (L) 05/29/2022 1145   MCHC 32.9 05/29/2022 1145   RDW 14.6 05/29/2022 1145   LYMPHSABS 2.5 03/11/2022 1248   MONOABS 0.7 03/11/2022 1248   EOSABS 0.2 03/11/2022 1248   BASOSABS 0.0 03/11/2022 1248    CMP     Component Value Date/Time   NA 136 05/29/2022 1145   K 3.7 05/29/2022 1145   CL 103 05/29/2022 1145   CO2 25 05/29/2022 1145   GLUCOSE 196 (H) 05/29/2022 1145   GLUCOSE 129 (H) 04/28/2006 0951   BUN 22 05/29/2022 1145   CREATININE 1.28 (H) 05/29/2022 1145   CREATININE 1.47 (H) 03/11/2022 1248   CALCIUM 9.6 05/29/2022 1145   PROT 7.1 03/11/2022 1248   ALBUMIN 3.2 (L) 03/11/2022 1248   AST 20 03/11/2022 1248   ALT 19 03/11/2022 1248   ALKPHOS 102 03/11/2022 1248   BILITOT 0.4 03/11/2022 1248   GFRNONAA 47 (L) 05/29/2022 1145   GFRNONAA 40 (L) 03/11/2022 1248   GFRAA >60 01/10/2020 1103    Lipid Panel     Component Value Date/Time   CHOL 181 05/30/2022 0541   TRIG 103 05/30/2022 0541   TRIG  72 04/28/2006 0951   HDL 37 (L) 05/30/2022 0541    CHOLHDL 4.9 05/30/2022 0541   VLDL 21 05/30/2022 0541   LDLCALC 123 (H) 05/30/2022 0541   LDLDIRECT 175.9 03/02/2012 1128    Lab Results  Component Value Date   HGBA1C 11.4 (H) 05/30/2022     Imaging I have reviewed the images obtained:  CT-scan of the brain: No acute abnormality (personally reviewed, agree with radiology)   MRI examination of the brain: Small infarct in right aspect of the medulla (personally reviewed, agree with radiology)  MRA head and neck: Severe stenosis of right V4 and severe stenosis of left V4 with mild to moderate stenosis of right supraclinoid ICA severe stenosis of left superior M2 and distal right M1 and severe stenosis of proximal P1 multifocal occlusion of right vertebral artery and distal V2 and V3 seen on MRA neck (personally reviewed, agree with radiology)  Repeat head CT for worsening symptoms personally reviewed, agree with radiology:  negative for acute intracranial process  Assessment: 62 year old patient with the above medical history presents with 2-day history of gait disturbance, dizziness, transient diplopia and an odd sensation throughout her body.  MRI shows right medullary stroke, likely due to small vessel disease.  Patient has multiple uncontrolled risk factors for stroke, including uncontrolled diabetes and hyperlipidemia.  Will need full stroke workup as well as PT/OT/SLP consults.  Recommend close outpatient follow-up for better control of diabetes, as well as lipid clinic referral.  Impression: Right medullary acute ischemic stroke, likely due to small vessel disease  Recommendations: Stroke/TIA Workup - Admit for stroke workup - Permissive HTN x48 hrs from sx onset goal BP <220/110 -- to be extended for 24 hours at minimum given worsening symptoms today - TTE w/ bubble - Consider lipid clinic referral for persistently elevated LDL despite therapy with statin and ezetimibe (if patient confirmed to be adherent) - Aspirin 81 mg  daily and Plavix 75 mg daily antiplt/anticoag - q4 hr neuro checks - STAT head CT for any change in neuro exam - Tele - PT/OT/SLP - Stroke education - Amb referral to neurology upon discharge   -Hydroxyzine for "nerves" and odd sensation in body -Neurology will follow-up echocardiogram but otherwise will be available as needed going forward -Recommend close PCP follow-up for better diabetes control  Pt seen by NP/Neuro and later by MD. Note/plan to be edited by MD as needed.  [place] , MSN, AGACNP-BC [facility] See Amion for schedule and pager information 05/30/2022 1:59 PM  Attending Neurologist's note:  I personally saw this patient, gathering history, performing a full neurologic examination, reviewing relevant labs, personally reviewing relevant imaging including CTs and MRI of the brain, and formulated the assessment and plan, adding the note above for completeness and clarity to accurately reflect my thoughts   Lesleigh Noe MD-PhD [facility] [phone] Available 7 PM to 7 AM, outside of these hours please call Neurologist on call as listed on Amion.

## 2022-05-31 ENCOUNTER — Inpatient Hospital Stay (HOSPITAL_COMMUNITY): Payer: Commercial Managed Care - HMO

## 2022-05-31 DIAGNOSIS — R42 Dizziness and giddiness: Secondary | ICD-10-CM

## 2022-05-31 DIAGNOSIS — I6389 Other cerebral infarction: Secondary | ICD-10-CM

## 2022-05-31 LAB — ECHOCARDIOGRAM COMPLETE
Area-P 1/2: 2.77 cm2
Calc EF: 59.4 %
Height: 64 in
S' Lateral: 2.3 cm
Single Plane A2C EF: 63 %
Single Plane A4C EF: 56.8 %
Weight: 2864.22 oz

## 2022-05-31 LAB — BASIC METABOLIC PANEL
Anion gap: 9 (ref 5–15)
BUN: 24 mg/dL — ABNORMAL HIGH (ref 8–23)
CO2: 22 mmol/L (ref 22–32)
Calcium: 9.4 mg/dL (ref 8.9–10.3)
Chloride: 106 mmol/L (ref 98–111)
Creatinine, Ser: 1.66 mg/dL — ABNORMAL HIGH (ref 0.44–1.00)
GFR, Estimated: 34 mL/min — ABNORMAL LOW (ref >60)
Glucose, Bld: 203 mg/dL — ABNORMAL HIGH (ref 70–99)
Potassium: 3.7 mmol/L (ref 3.5–5.1)
Sodium: 137 mmol/L (ref 135–145)

## 2022-05-31 LAB — GLUCOSE, CAPILLARY
Glucose-Capillary: 212 mg/dL — ABNORMAL HIGH (ref 70–99)
Glucose-Capillary: 209 mg/dL — ABNORMAL HIGH (ref 70–99)
Glucose-Capillary: 305 mg/dL — ABNORMAL HIGH (ref 70–99)
Glucose-Capillary: 112 mg/dL — ABNORMAL HIGH (ref 70–99)
Glucose-Capillary: 200 mg/dL — ABNORMAL HIGH (ref 70–99)

## 2022-05-31 MED ORDER — SODIUM CHLORIDE 0.9 % IV BOLUS
500.0000 mL | Freq: Once | INTRAVENOUS | Status: AC
  Administered 2022-05-31: 500 mL via INTRAVENOUS

## 2022-05-31 MED ORDER — SODIUM CHLORIDE 0.9 % IV SOLN: INTRAVENOUS | Status: DC

## 2022-05-31 NOTE — TOC Initial Note (Signed)
Transition of Care [facility]) - Initial/Assessment Note    Patient Details  Name: [name] MRN: [id number] Date of Birth: 1960/08/30  Transition of Care [facility]) CM/SW Contact:    Angelita Ingles, RN Phone Number:[phone]  05/31/2022, 3:35 PM  Clinical Narrative:                  Transition of Care [facility]) Screening Note   Patient Details  Name: [name] Date of Birth: 03/05/1961   Transition of Care [facility]) CM/SW Contact:    Angelita Ingles, RN Phone Number: 05/31/2022, 3:35 PM    Transition of Care Department [facility]) has reviewed patient and no TOC needs have been identified at this time. We will continue to monitor patient advancement through interdisciplinary progression rounds. If new patient transition needs arise, please place a TOC consult.          Patient Goals and CMS Choice            Expected Discharge Plan and Services                                              Prior Living Arrangements/Services                       Activities of Daily Living Home Assistive Devices/Equipment: Cane (specify quad or straight), Eyeglasses ADL Screening (condition at time of admission) Patient's cognitive ability adequate to safely complete daily activities?: Yes Is the patient deaf or have difficulty hearing?: No Does the patient have difficulty seeing, even when wearing glasses/contacts?: No Does the patient have difficulty concentrating, remembering, or making decisions?: No Patient able to express need for assistance with ADLs?: Yes Does the patient have difficulty dressing or bathing?: No Independently performs ADLs?: Yes (appropriate for developmental age) Does the patient have difficulty walking or climbing stairs?: No Weakness of Legs: Both Weakness of Arms/Hands: None  Permission Sought/Granted                  Emotional Assessment              Admission diagnosis:  Dizziness [R42] Epigastric pain  [R10.13] Pyelonephritis [N12] Stroke [facility]) [I63.9] Patient Active Problem List   Diagnosis Date Noted   Dizziness 05/29/2022   Personal history of CLL (chronic lymphocytic leukemia) 05/29/2022   Abdominal pain 05/29/2022   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 disease, with long-term current use of insulin ([place]) 04/14/2022   Type 2 diabetes mellitus with diabetic polyneuropathy, with long-term current use of insulin ([place]) 04/14/2022   Routine general medical examination at a health care facility 03/16/2022   Hyperlipidemia LDL goal <70 10/01/2021   Stroke ([place]) 09/17/2021   Stage 3a chronic kidney disease (CKD) ([place]) 09/16/2021   Thyroid nodule 09/16/2021   Cervical lymphadenopathy 09/16/2021   Acute CVA (cerebrovascular accident) ([place]) 09/15/2021   GERD (gastroesophageal reflux disease) 04/30/2021   Peripheral arterial disease ([place]) 04/13/2021   Atherosclerosis of aorta ([place]) 02/14/2021   Cerebral thrombosis with cerebral infarction 02/08/2021   Abnormal MRI of head 02/07/2021   Vertigo 02/07/2021   Class 1 obesity due to excess calories with body mass index (BMI) of 31.0 to 31.9 in adult 02/07/2021   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   Trigger finger, left ring finger 10/04/2018   Alternating constipation and diarrhea 04/20/2018  Diabetic peripheral neuropathy associated with type 2 diabetes mellitus ([place]) 02/26/2018   DM (diabetes mellitus), type 2 with peripheral vascular complications ([place]) 26/83/4196   ABNORMALITY OF GAIT 05/10/2010   ECZEMA, ATOPIC 04/03/2009   Hyperlipidemia associated with type 2 diabetes mellitus ([place]) 03/06/2009   BACK PAIN 11/14/2008   Cerebrovascular disease 08/05/2008   DIPLOPIA 07/15/2008   Essential hypertension 07/15/2008   PCP:  [name], [name], MD Pharmacy:   [facility], [address] [place] [address] Phone: [phone] Fax: [facility] [address] [place] [address] Phone: [phone] Fax: [facility] [address] [place] [address] Phone: [phone] Fax: [phone]     Social Determinants of Health (SDOH) Social History: [place]: No Food Insecurity (05/29/2022)  Housing: Low Risk  (05/29/2022)  Transportation Needs: No Transportation Needs (05/29/2022)  Utilities: Not At Risk (05/29/2022)  Depression (PHQ2-9): Low Risk  (03/16/2022)  Tobacco Use: Low Risk  (05/29/2022)   SDOH Interventions:     Readmission Risk Interventions     No data to display

## 2022-05-31 NOTE — Progress Notes (Addendum)
PROGRESS NOTE  [name]  WPY:[id number] DOB: 1961/04/06 DOA: 05/29/2022 PCP: [name], [name], MD   Brief Narrative: Patient is a 62 year old female with history of non Hodgkin's lymphoma, Guillain-Barr syndrome, hypertension, multiple strokes, uncontrolled type 2 diabetes, hyperlipidemia, peripheral artery disease, CKD stage IIIa who presents with complaints of dizziness bilateral lower extremity weakness, double vision, generalized numbness, unsteady gait.  She normally ambulates with help of walker.  History of multiple strokes with no residual deficits.  Currently on Plavix at home.  On presentation ,she was hypertensive.  Neurology workup was started.  MRI showed acute ischemic stroke on the right aspect of medulla, multivessel disease.  Has developed severe left-sided hemiplegia.   PT recommending acute inpatient rehab.  Neuro following  Assessment & Plan:  Principal Problem:   Dizziness Active Problems:   Hyperlipidemia associated with type 2 diabetes mellitus (HCC)   Essential hypertension   DM (diabetes mellitus), type 2 with peripheral vascular complications (HCC)   Stroke (HCC)   Personal history of CLL (chronic lymphocytic leukemia)   Abdominal pain   Acute ischemic stroke: Presented with complaints of  dizziness , generalized numbness, unsteady gait.    On Plavix , cilostazol at home.Continue same as per neurology. MRI showed acute ischemic stroke on the right aspect of medulla, multivessel disease.  Has developed severe left-sided hemiplegia.  Lipid panel showed LDL of 123.  A1c of more than 11.  Echo showed EF of 60 to 05%, grade 1 diastolic dysfunction, no intracardiac source of  emboli.   PT recommending acute inpatient rehab.  TOC consulted  AKI on CKD stage IIIa: Baseline creatinine ranges from 1.2-1.4.  Kidney function worsened today with creatinine in the range of 1.6.  Started on gentle IV fluids.  Will consider discontinuing tomorrow  Abdominal pain: Complain  of tightness beneath her left breast/upper epigastric area that was relieved by GI cocktail.  No history of dysuria, lower abdominal pain.  Does not have CVA tenderness.  CT angiogram was suspicious pyelonephritis but patient does not have any signs of UTI.  UA was not suggestive.  Antibiotics discontinued. No abdominal pain today.  History of CLL: Follows with Dr. Irene Limbo.  CT imaging showed bulky adenopathy of the axillary, central and retroperitoneal region.  We recommend outpatient follow-up with oncology.  Diabetes type 2: Uncontrolled.  Last A1c of more than 11.  Continue current insulin regimen.  Monitor blood sugars.  Diabetic coordinator consulted.  This was likely the contributor for the stroke  Hypertension: Continue prn medications for severe hypertension . Continue as needed medications for severe hypertension.  Allow permissive hypertension.  Maybe tomorrow we can start on oral  antihypertensives  Hyperlipidemia: On statin, maximum dose.  LDL of 123  Obesity: BMI of 30.7      DVT prophylaxis:enoxaparin (LOVENOX) injection 40 mg Start: 05/30/22 1000     Code Status: Full Code  Family Communication:: Discussed with sister on phone today  Patient status: Inpatient  Patient is from : Home  Anticipated discharge to: Acute inpatient rehab  Estimated DC date: After full workup,awating bed   Consultants: Neurology  Procedures: None  Antimicrobials:  Anti-infectives (From admission, onward)    Start     Dose/Rate Route Frequency Ordered Stop   05/29/22 1715  cefTRIAXone (ROCEPHIN) 2 g in sodium chloride 0.9 % 100 mL IVPB        2 g 200 mL/hr over 30 Minutes Intravenous  Once 05/29/22 1712 05/29/22 1859       Subjective:  Patient seen and examined at bedside today.  Hemodynamically stable, mild hypertension.  Has developed severe hemiplegia on the left side since yesterday.  Alert and awake, speech is clear.  Objective: Vitals:   05/30/22 2119 05/31/22 0148 05/31/22  0539 05/31/22 0955  BP: (!) 163/81 139/73 (!) 168/93 (!) 161/87  Pulse: 76 64 82 65  Resp: '14 14 14 18  '$ Temp: 98.3 F (36.8 C) 97.9 F (36.6 C) 98 F (36.7 C)   TempSrc: Oral Oral Oral   SpO2: 96% 95% 98% 96%  Weight:      Height:        Intake/Output Summary (Last 24 hours) at 05/31/2022 1118 Last data filed at 05/31/2022 0959 Gross per 24 hour  Intake 1220 ml  Output 700 ml  Net 520 ml   Filed Weights   05/29/22 1556 05/29/22 2206  Weight: 80.7 kg 81.2 kg    Examination:   General exam: Overall comfortable, not in distress,obese HEENT: PERRL Respiratory system:  no wheezes or crackles  Cardiovascular system: S1 & S2 heard, RRR.  Gastrointestinal system: Abdomen is nondistended, soft and nontender. Central nervous system: Alert and oriented, dense left hemipegia Extremities: No edema, no clubbing ,no cyanosis Skin: No rashes, no ulcers,no icterus       Data Reviewed: I have personally reviewed following labs and imaging studies  CBC: Recent Labs  Lab 05/29/22 1145  WBC 5.9  HGB 12.1  HCT 36.8  MCV 76.8*  PLT 096   Basic Metabolic Panel: Recent Labs  Lab 05/29/22 1145 05/31/22 0606  NA 136 137  K 3.7 3.7  CL 103 106  CO2 25 22  GLUCOSE 196* 203*  BUN 22 24*  CREATININE 1.28* 1.66*  CALCIUM 9.6 9.4  MG 2.1  --      Recent Results (from the past 240 hour(s))  Resp panel by RT-PCR (RSV, Flu A&B, Covid) Nasopharyngeal Swab     Status: None   Collection Time: 05/29/22 11:21 AM   Specimen: Nasopharyngeal Swab; Nasal Swab  Result Value Ref Range Status   SARS Coronavirus 2 by RT PCR NEGATIVE NEGATIVE Final    Comment: (NOTE) SARS-CoV-2 target nucleic acids are NOT DETECTED.  The SARS-CoV-2 RNA is generally detectable in upper respiratory specimens during the acute phase of infection. The lowest concentration of SARS-CoV-2 viral copies this assay can detect is 138 copies/mL. A negative result does not preclude SARS-Cov-2 infection and should not  be used as the sole basis for treatment or other patient management decisions. A negative result may occur with  improper specimen collection/handling, submission of specimen other than nasopharyngeal swab, presence of viral mutation(s) within the areas targeted by this assay, and inadequate number of viral copies(<138 copies/mL). A negative result must be combined with clinical observations, patient history, and epidemiological information. The expected result is Negative.  Fact Sheet for Patients:  EntrepreneurPulse.com.au  Fact Sheet for Healthcare Providers:  IncredibleEmployment.be  This test is no t yet approved or cleared by the [place] FDA and  has been authorized for detection and/or diagnosis of SARS-CoV-2 by FDA under an Emergency Use Authorization (EUA). This EUA will remain  in effect (meaning this test can be used) for the duration of the COVID-19 declaration under Section 564(b)(1) of the Act, 21 U.S.C.section 360bbb-3(b)(1), unless the authorization is terminated  or revoked sooner.       Influenza A by PCR NEGATIVE NEGATIVE Final   Influenza B by PCR NEGATIVE NEGATIVE Final    Comment: (NOTE)  The Xpert Xpress SARS-CoV-2/FLU/RSV plus assay is intended as an aid in the diagnosis of influenza from Nasopharyngeal swab specimens and should not be used as a sole basis for treatment. Nasal washings and aspirates are unacceptable for Xpert Xpress SARS-CoV-2/FLU/RSV testing.  Fact Sheet for Patients: EntrepreneurPulse.com.au  Fact Sheet for Healthcare Providers: IncredibleEmployment.be  This test is not yet approved or cleared by the [place] FDA and has been authorized for detection and/or diagnosis of SARS-CoV-2 by FDA under an Emergency Use Authorization (EUA). This EUA will remain in effect (meaning this test can be used) for the duration of the COVID-19 declaration under Section  564(b)(1) of the Act, 21 U.S.C. section 360bbb-3(b)(1), unless the authorization is terminated or revoked.     Resp Syncytial Virus by PCR NEGATIVE NEGATIVE Final    Comment: (NOTE) Fact Sheet for Patients: EntrepreneurPulse.com.au  Fact Sheet for Healthcare Providers: IncredibleEmployment.be  This test is not yet approved or cleared by the [place] FDA and has been authorized for detection and/or diagnosis of SARS-CoV-2 by FDA under an Emergency Use Authorization (EUA). This EUA will remain in effect (meaning this test can be used) for the duration of the COVID-19 declaration under Section 564(b)(1) of the Act, 21 U.S.C. section 360bbb-3(b)(1), unless the authorization is terminated or revoked.  Performed at [facility], [address] [place], [place] [address]      Radiology Studies: ECHOCARDIOGRAM COMPLETE  Result Date: 05/31/2022    ECHOCARDIOGRAM REPORT   Patient Name:   ASHIYAH PAVLAK Date of Exam: 05/31/2022 Medical Rec #:  [id number]       Height:       64.0 in Accession #:    [id number]      Weight:       179.0 lb Date of Birth:  21-Sep-1960       BSA:          1.866 m Patient Age:    19 years        BP:           168/93 mmHg Patient Gender: F               HR:           72 bpm. Exam Location:  Inpatient Procedure: 2D Echo, Cardiac Doppler and Color Doppler Indications:    Stroke  History:        Patient has prior history of Echocardiogram examinations.                 Stroke; Risk Factors:Hypertension, Diabetes and Dyslipidemia.  Sonographer:    Phineas Douglas Referring Phys: [phone] Kunal Levario IMPRESSIONS  1. Left ventricular ejection fraction, by estimation, is 60 to 65%. The left ventricle has normal function. The left ventricle has no regional wall motion abnormalities. There is mild concentric left ventricular hypertrophy. Left ventricular diastolic parameters are consistent with Grade I diastolic dysfunction (impaired  relaxation).  2. Right ventricular systolic function is normal. The right ventricular size is normal. Tricuspid regurgitation signal is inadequate for assessing PA pressure.  3. The mitral valve is grossly normal. No evidence of mitral valve regurgitation. No evidence of mitral stenosis.  4. The aortic valve is tricuspid. Aortic valve regurgitation is not visualized. No aortic stenosis is present.  5. The inferior vena cava is normal in size with greater than 50% respiratory variability, suggesting right atrial pressure of 3 mmHg. Conclusion(s)/Recommendation(s): No intracardiac source of embolism detected on this transthoracic study. Consider a transesophageal echocardiogram  to exclude cardiac source of embolism if clinically indicated. FINDINGS  Left Ventricle: Left ventricular ejection fraction, by estimation, is 60 to 65%. The left ventricle has normal function. The left ventricle has no regional wall motion abnormalities. The left ventricular internal cavity size was normal in size. There is  mild concentric left ventricular hypertrophy. Left ventricular diastolic parameters are consistent with Grade I diastolic dysfunction (impaired relaxation). Right Ventricle: The right ventricular size is normal. No increase in right ventricular wall thickness. Right ventricular systolic function is normal. Tricuspid regurgitation signal is inadequate for assessing PA pressure. Left Atrium: Left atrial size was normal in size. Right Atrium: Right atrial size was normal in size. Pericardium: Trivial pericardial effusion is present. Mitral Valve: The mitral valve is grossly normal. No evidence of mitral valve regurgitation. No evidence of mitral valve stenosis. Tricuspid Valve: The tricuspid valve is grossly normal. Tricuspid valve regurgitation is trivial. No evidence of tricuspid stenosis. Aortic Valve: The aortic valve is tricuspid. Aortic valve regurgitation is not visualized. No aortic stenosis is present. Pulmonic  Valve: The pulmonic valve was grossly normal. Pulmonic valve regurgitation is trivial. No evidence of pulmonic stenosis. Aorta: The aortic root and ascending aorta are structurally normal, with no evidence of dilitation. Venous: The inferior vena cava is normal in size with greater than 50% respiratory variability, suggesting right atrial pressure of 3 mmHg. IAS/Shunts: The atrial septum is grossly normal.  LEFT VENTRICLE PLAX 2D LVIDd:         3.60 cm      Diastology LVIDs:         2.30 cm      LV e' medial:    5.68 cm/s LV PW:         1.30 cm      LV E/e' medial:  12.4 LV IVS:        1.20 cm      LV e' lateral:   7.22 cm/s LVOT diam:     2.00 cm      LV E/e' lateral: 9.8 LV SV:         69 LV SV Index:   37 LVOT Area:     3.14 cm  LV Volumes (MOD) LV vol d, MOD A2C: 119.0 ml LV vol d, MOD A4C: 111.0 ml LV vol s, MOD A2C: 44.0 ml LV vol s, MOD A4C: 48.0 ml LV SV MOD A2C:     75.0 ml LV SV MOD A4C:     111.0 ml LV SV MOD BP:      68.6 ml RIGHT VENTRICLE             IVC RV Basal diam:  3.10 cm     IVC diam: 1.40 cm RV S prime:     13.90 cm/s TAPSE (M-mode): 1.8 cm LEFT ATRIUM             Index        RIGHT ATRIUM           Index LA diam:        3.70 cm 1.98 cm/m   RA Area:     12.00 cm LA Vol (A2C):   51.4 ml 27.54 ml/m  RA Volume:   23.00 ml  12.32 ml/m LA Vol (A4C):   56.6 ml 30.33 ml/m LA Biplane Vol: 58.1 ml 31.13 ml/m  AORTIC VALVE LVOT Vmax:   93.20 cm/s LVOT Vmean:  62.800 cm/s LVOT VTI:    0.220 m  AORTA Ao Root diam: 3.10  cm Ao Asc diam:  3.20 cm MITRAL VALVE MV Area (PHT): 2.77 cm    SHUNTS MV Decel Time: 274 msec    Systemic VTI:  0.22 m MV E velocity: 70.70 cm/s  Systemic Diam: 2.00 cm MV A velocity: 95.50 cm/s MV E/A ratio:  0.74 Eleonore Chiquito MD Electronically signed by Eleonore Chiquito MD Signature Date/Time: 05/31/2022/9:44:38 AM    Final    CT HEAD WO CONTRAST (5MM)  Result Date: 05/30/2022 CLINICAL DATA:  Follow-up stroke EXAM: CT HEAD WITHOUT CONTRAST TECHNIQUE: Contiguous axial images were  obtained from the base of the skull through the vertex without intravenous contrast. RADIATION DOSE REDUCTION: This exam was performed according to the departmental dose-optimization program which includes automated exposure control, adjustment of the mA and/or kV according to patient size and/or use of iterative reconstruction technique. COMPARISON:  MRI brain dated 05/30/2022 at 1031 hours FINDINGS: Brain: No evidence of acute infarction, hemorrhage, hydrocephalus, extra-axial collection or mass lesion/mass effect. Known tiny acute infarct within the right medulla is not evident on current CT. Mild subcortical white matter and periventricular small vessel ischemic changes. Vascular: Intracranial atherosclerosis. Skull: Normal. Negative for fracture or focal lesion. Sinuses/Orbits: The visualized paranasal sinuses are essentially clear. The mastoid air cells are unopacified. Other: None. IMPRESSION: Known tiny acute infarct within the right medulla is not evident on current CT. Mild small vessel ischemic changes. Electronically Signed   By: Julian Hy M.D.   On: 05/30/2022 23:55   MR ANGIO HEAD WO CONTRAST  Result Date: 05/30/2022 CLINICAL DATA:  History of non-Hodgkin's lymphoma, hypertension, Guillain-Barre syndrome, and multiple strokes. Complains of dizziness and overall weakness. Possible CVA. EXAM: MRI HEAD WITHOUT CONTRAST MRA HEAD WITHOUT CONTRAST MRA NECK WITHOUT AND WITH CONTRAST TECHNIQUE: Multiplanar, multi-echo pulse sequences of the brain and surrounding structures were acquired without intravenous contrast. Angiographic images of the Circle of Willis were acquired using MRA technique without intravenous contrast. Angiographic images of the neck were acquired using MRA technique without and with intravenous contrast. Carotid stenosis measurements (when applicable) are obtained utilizing NASCET criteria, using the distal internal carotid diameter as the denominator. CONTRAST:  74m GADAVIST  GADOBUTROL 1 MMOL/ML IV SOLN COMPARISON:  None Available. CT head 1 day prior, CTA head/neck 09/15/2021 FINDINGS: MRI HEAD FINDINGS Brain: There is diffusion restriction in the right aspect of the medulla with faint associated FLAIR signal abnormality consistent with acute infarct (5-9). There is no associated hemorrhage or mass effect. There is no other evidence of acute infarct. There is no acute intracranial hemorrhage or extra-axial fluid collection. Parenchymal volume is normal. The ventricles are normal in size. Scattered foci of FLAIR signal abnormality in the supratentorial white matter are nonspecific but likely reflects sequela of underlying chronic small-vessel ischemic change, similar to the MRI from 2023. There are small remote infarcts in the bilateral cerebellar hemispheres, at least 1 of which in the left cerebellar hemisphere is new since 09/15/2021. There are punctate chronic microhemorrhages in the right thalamus and right parietal white matter, nonspecific but possibly hypertensive. The pituitary and suprasellar region are normal. There is no mass lesion. There is no mass effect or midline shift. Vascular: See below. Skull and upper cervical spine: Normal marrow signal. Sinuses/Orbits: The paranasal sinuses are clear. The globes and orbits are unremarkable. Other: There are prominent lymph nodes in the imaged upper neck on the coronal diffusion sequence which appear overall decreased in size since the prior MRI from 2023 though are incompletely evaluated. MRA HEAD FINDINGS Anterior circulation:  There is atherosclerotic irregularity of the carotid siphons resulting in moderate stenosis of the cavernous segment on the left and mild-to-moderate stenosis of the supraclinoid segment on the right. The left M1 segment is patent. There is moderate to severe stenosis of the origin of the superior left M2 division (7-156). The distal left MCA branches appear patent. There is severe stenosis of the distal  right M1 segment (1036-3). There is multifocal moderate narrowing of the proximal right superior M2 division. The distal right MCA branches otherwise appear patent. The bilateral ACAs are patent, without proximal stenosis or occlusion. The anterior communicating artery is not definitely identified. There is no aneurysm or AVM. Posterior circulation: There is no flow related enhancement of the right V4 segment on the time-of-flight MRA head. There is intermittent enhancement on the postcontrast MRA neck images. Findings are similar to the CTA from 2023. The PICA origin is not seen on the right. There is multifocal severe stenosis/near occlusion of the left V4 segment, also similar to the prior CT. PICA is identified on the left. The basilar artery is patent with atherosclerotic irregularity and narrowing. There is severe stenosis of the origin of the left P1 segment. There is multifocal atherosclerotic irregularity and narrowing in the remainder of the left PCA without other high-grade stenosis or occlusion. The right PCA is patent with mild atherosclerotic irregularity but no high-grade stenosis or occlusion. Findings are similar to the prior CTA. There is no aneurysm or AVM. Anatomic variants: None. MRA NECK FINDINGS Aortic arch: The imaged aortic arch is normal. The origins of the major branch vessels are patent. The subclavian arteries are patent to the level imaged. 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 Right carotid system: The right common, internal, and external carotid arteries are patent, with mild irregularity at the bifurcation but no hemodynamically significant stenosis or occlusion. There is no evidence of dissection or aneurysm. Left carotid system: The left common, internal, and external carotid arteries are patent, without significant stenosis or occlusion. There is no evidence of dissection or aneurysm. Vertebral arteries: The right vertebral artery appears patent at its  origin. There is short-segment high-grade stenosis/occlusion of the distal V2 segment (15-60). There is diminutive reconstitution with further occlusion of the V3 segment. There is moderate stenosis of the left vertebral artery origin (15-52). The left vertebral artery is otherwise patent in the neck without other hemodynamically significant stenosis or occlusion. There is no evidence of dissection or aneurysm. There is antegrade flow in both vertebral arteries. Other: None IMPRESSION: MR HEAD 1. Small acute infarct in the right aspect of the medulla without hemorrhage or mass effect. 2. Background chronic small-vessel ischemic change with small remote infarcts in the bilateral cerebellar hemispheres, one of which on the left is new since 09/15/2021. 3. Prominent lymph nodes in the upper neck consistent with the history of lymphoma, overall decreased in size since the prior studies from 09/15/2021 but incompletely evaluated. MRA HEAD 1. Diminutive enhancement of the right V4 segment with intermittent occlusion, and multifocal severe stenosis/near occlusion of the left V4 segment, similar to the prior CTA. 2. Additional intracranial atherosclerotic disease as detailed above resulting in moderate stenosis of the left cavernous ICA, mild-to-moderate stenosis of the right supraclinoid ICA, severe stenosis of the origin of the left superior M2 division, severe stenosis of the distal right M1 segment, and severe stenosis of the proximal left P1 segment. MRA NECK 1. 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 2. Moderate stenosis of the origin of the left vertebral artery, not significantly changed. 3. Patent carotid systems bilaterally. Electronically Signed   By: Valetta Mole M.D.   On: 05/30/2022 12:01   MR BRAIN WO CONTRAST  Result Date: 05/30/2022 CLINICAL DATA:  History of non-Hodgkin's lymphoma, hypertension, Guillain-Barre syndrome, and multiple strokes. Complains of  dizziness and overall weakness. Possible CVA. EXAM: MRI HEAD WITHOUT CONTRAST MRA HEAD WITHOUT CONTRAST MRA NECK WITHOUT AND WITH CONTRAST TECHNIQUE: Multiplanar, multi-echo pulse sequences of the brain and surrounding structures were acquired without intravenous contrast. Angiographic images of the Circle of Willis were acquired using MRA technique without intravenous contrast. Angiographic images of the neck were acquired using MRA technique without and with intravenous contrast. Carotid stenosis measurements (when applicable) are obtained utilizing NASCET criteria, using the distal internal carotid diameter as the denominator. CONTRAST:  91m GADAVIST GADOBUTROL 1 MMOL/ML IV SOLN COMPARISON:  None Available. CT head 1 day prior, CTA head/neck 09/15/2021 FINDINGS: MRI HEAD FINDINGS Brain: There is diffusion restriction in the right aspect of the medulla with faint associated FLAIR signal abnormality consistent with acute infarct (5-9). There is no associated hemorrhage or mass effect. There is no other evidence of acute infarct. There is no acute intracranial hemorrhage or extra-axial fluid collection. Parenchymal volume is normal. The ventricles are normal in size. Scattered foci of FLAIR signal abnormality in the supratentorial white matter are nonspecific but likely reflects sequela of underlying chronic small-vessel ischemic change, similar to the MRI from 2023. There are small remote infarcts in the bilateral cerebellar hemispheres, at least 1 of which in the left cerebellar hemisphere is new since 09/15/2021. There are punctate chronic microhemorrhages in the right thalamus and right parietal white matter, nonspecific but possibly hypertensive. The pituitary and suprasellar region are normal. There is no mass lesion. There is no mass effect or midline shift. Vascular: See below. Skull and upper cervical spine: Normal marrow signal. Sinuses/Orbits: The paranasal sinuses are clear. The globes and orbits are  unremarkable. Other: There are prominent lymph nodes in the imaged upper neck on the coronal diffusion sequence which appear overall decreased in size since the prior MRI from 2023 though are incompletely evaluated. MRA HEAD FINDINGS Anterior circulation: There is atherosclerotic irregularity of the carotid siphons resulting in moderate stenosis of the cavernous segment on the left and mild-to-moderate stenosis of the supraclinoid segment on the right. The left M1 segment is patent. There is moderate to severe stenosis of the origin of the superior left M2 division (7-156). The distal left MCA branches appear patent. There is severe stenosis of the distal right M1 segment (1036-3). There is multifocal moderate narrowing of the proximal right superior M2 division. The distal right MCA branches otherwise appear patent. The bilateral ACAs are patent, without proximal stenosis or occlusion. The anterior communicating artery is not definitely identified. There is no aneurysm or AVM. Posterior circulation: There is no flow related enhancement of the right V4 segment on the time-of-flight MRA head. There is intermittent enhancement on the postcontrast MRA neck images. Findings are similar to the CTA from 2023. The PICA origin is not seen on the right. There is multifocal severe stenosis/near occlusion of the left V4 segment, also similar to the prior CT. PICA is identified on the left. The basilar artery is patent with atherosclerotic irregularity and narrowing. There is severe stenosis of the origin of the left P1 segment. There is multifocal atherosclerotic irregularity and narrowing in the remainder of the left PCA  without other high-grade stenosis or occlusion. The right PCA is patent with mild atherosclerotic irregularity but no high-grade stenosis or occlusion. Findings are similar to the prior CTA. There is no aneurysm or AVM. Anatomic variants: None. MRA NECK FINDINGS Aortic arch: The imaged aortic arch is normal.  The origins of the major branch vessels are patent. The subclavian arteries are patent to the level imaged. 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 Right carotid system: The right common, internal, and external carotid arteries are patent, with mild irregularity at the bifurcation but no hemodynamically significant stenosis or occlusion. There is no evidence of dissection or aneurysm. Left carotid system: The left common, internal, and external carotid arteries are patent, without significant stenosis or occlusion. There is no evidence of dissection or aneurysm. Vertebral arteries: The right vertebral artery appears patent at its origin. There is short-segment high-grade stenosis/occlusion of the distal V2 segment (15-60). There is diminutive reconstitution with further occlusion of the V3 segment. There is moderate stenosis of the left vertebral artery origin (15-52). The left vertebral artery is otherwise patent in the neck without other hemodynamically significant stenosis or occlusion. There is no evidence of dissection or aneurysm. There is antegrade flow in both vertebral arteries. Other: None IMPRESSION: MR HEAD 1. Small acute infarct in the right aspect of the medulla without hemorrhage or mass effect. 2. Background chronic small-vessel ischemic change with small remote infarcts in the bilateral cerebellar hemispheres, one of which on the left is new since 09/15/2021. 3. Prominent lymph nodes in the upper neck consistent with the history of lymphoma, overall decreased in size since the prior studies from 09/15/2021 but incompletely evaluated. MRA HEAD 1. Diminutive enhancement of the right V4 segment with intermittent occlusion, and multifocal severe stenosis/near occlusion of the left V4 segment, similar to the prior CTA. 2. Additional intracranial atherosclerotic disease as detailed above resulting in moderate stenosis of the left cavernous ICA, mild-to-moderate stenosis of the  right supraclinoid ICA, severe stenosis of the origin of the left superior M2 division, severe stenosis of the distal right M1 segment, and severe stenosis of the proximal left P1 segment. MRA NECK 1. 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 2. Moderate stenosis of the origin of the left vertebral artery, not significantly changed. 3. Patent carotid systems bilaterally. Electronically Signed   By: Valetta Mole M.D.   On: 05/30/2022 12:01   MR ANGIO NECK W WO CONTRAST  Result Date: 05/30/2022 CLINICAL DATA:  History of non-Hodgkin's lymphoma, hypertension, Guillain-Barre syndrome, and multiple strokes. Complains of dizziness and overall weakness. Possible CVA. EXAM: MRI HEAD WITHOUT CONTRAST MRA HEAD WITHOUT CONTRAST MRA NECK WITHOUT AND WITH CONTRAST TECHNIQUE: Multiplanar, multi-echo pulse sequences of the brain and surrounding structures were acquired without intravenous contrast. Angiographic images of the Circle of Willis were acquired using MRA technique without intravenous contrast. Angiographic images of the neck were acquired using MRA technique without and with intravenous contrast. Carotid stenosis measurements (when applicable) are obtained utilizing NASCET criteria, using the distal internal carotid diameter as the denominator. CONTRAST:  5m GADAVIST GADOBUTROL 1 MMOL/ML IV SOLN COMPARISON:  None Available. CT head 1 day prior, CTA head/neck 09/15/2021 FINDINGS: MRI HEAD FINDINGS Brain: There is diffusion restriction in the right aspect of the medulla with faint associated FLAIR signal abnormality consistent with acute infarct (5-9). There is no associated hemorrhage or mass effect. There is no other evidence of acute infarct. There is no acute intracranial  hemorrhage or extra-axial fluid collection. Parenchymal volume is normal. The ventricles are normal in size. Scattered foci of FLAIR signal abnormality in the supratentorial white matter are nonspecific  but likely reflects sequela of underlying chronic small-vessel ischemic change, similar to the MRI from 2023. There are small remote infarcts in the bilateral cerebellar hemispheres, at least 1 of which in the left cerebellar hemisphere is new since 09/15/2021. There are punctate chronic microhemorrhages in the right thalamus and right parietal white matter, nonspecific but possibly hypertensive. The pituitary and suprasellar region are normal. There is no mass lesion. There is no mass effect or midline shift. Vascular: See below. Skull and upper cervical spine: Normal marrow signal. Sinuses/Orbits: The paranasal sinuses are clear. The globes and orbits are unremarkable. Other: There are prominent lymph nodes in the imaged upper neck on the coronal diffusion sequence which appear overall decreased in size since the prior MRI from 2023 though are incompletely evaluated. MRA HEAD FINDINGS Anterior circulation: There is atherosclerotic irregularity of the carotid siphons resulting in moderate stenosis of the cavernous segment on the left and mild-to-moderate stenosis of the supraclinoid segment on the right. The left M1 segment is patent. There is moderate to severe stenosis of the origin of the superior left M2 division (7-156). The distal left MCA branches appear patent. There is severe stenosis of the distal right M1 segment (1036-3). There is multifocal moderate narrowing of the proximal right superior M2 division. The distal right MCA branches otherwise appear patent. The bilateral ACAs are patent, without proximal stenosis or occlusion. The anterior communicating artery is not definitely identified. There is no aneurysm or AVM. Posterior circulation: There is no flow related enhancement of the right V4 segment on the time-of-flight MRA head. There is intermittent enhancement on the postcontrast MRA neck images. Findings are similar to the CTA from 2023. The PICA origin is not seen on the right. There is  multifocal severe stenosis/near occlusion of the left V4 segment, also similar to the prior CT. PICA is identified on the left. The basilar artery is patent with atherosclerotic irregularity and narrowing. There is severe stenosis of the origin of the left P1 segment. There is multifocal atherosclerotic irregularity and narrowing in the remainder of the left PCA without other high-grade stenosis or occlusion. The right PCA is patent with mild atherosclerotic irregularity but no high-grade stenosis or occlusion. Findings are similar to the prior CTA. There is no aneurysm or AVM. Anatomic variants: None. MRA NECK FINDINGS Aortic arch: The imaged aortic arch is normal. The origins of the major branch vessels are patent. The subclavian arteries are patent to the level imaged. 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 Right carotid system: The right common, internal, and external carotid arteries are patent, with mild irregularity at the bifurcation but no hemodynamically significant stenosis or occlusion. There is no evidence of dissection or aneurysm. Left carotid system: The left common, internal, and external carotid arteries are patent, without significant stenosis or occlusion. There is no evidence of dissection or aneurysm. Vertebral arteries: The right vertebral artery appears patent at its origin. There is short-segment high-grade stenosis/occlusion of the distal V2 segment (15-60). There is diminutive reconstitution with further occlusion of the V3 segment. There is moderate stenosis of the left vertebral artery origin (15-52). The left vertebral artery is otherwise patent in the neck without other hemodynamically significant stenosis or occlusion. There is no evidence of dissection or aneurysm. There is antegrade flow in both vertebral arteries. Other: None  IMPRESSION: MR HEAD 1. Small acute infarct in the right aspect of the medulla without hemorrhage or mass effect. 2. Background  chronic small-vessel ischemic change with small remote infarcts in the bilateral cerebellar hemispheres, one of which on the left is new since 09/15/2021. 3. Prominent lymph nodes in the upper neck consistent with the history of lymphoma, overall decreased in size since the prior studies from 09/15/2021 but incompletely evaluated. MRA HEAD 1. Diminutive enhancement of the right V4 segment with intermittent occlusion, and multifocal severe stenosis/near occlusion of the left V4 segment, similar to the prior CTA. 2. Additional intracranial atherosclerotic disease as detailed above resulting in moderate stenosis of the left cavernous ICA, mild-to-moderate stenosis of the right supraclinoid ICA, severe stenosis of the origin of the left superior M2 division, severe stenosis of the distal right M1 segment, and severe stenosis of the proximal left P1 segment. MRA NECK 1. 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 2. Moderate stenosis of the origin of the left vertebral artery, not significantly changed. 3. Patent carotid systems bilaterally. Electronically Signed   By: Valetta Mole M.D.   On: 05/30/2022 12:01   CT Angio Chest/Abd/Pel for Dissection W and/or W/WO  Result Date: 05/29/2022 CLINICAL DATA:  numbness, tingling ,dizziness, hypertensive Radiologic records indicates history of CLL. EXAM: CT ANGIOGRAPHY CHEST, ABDOMEN AND PELVIS TECHNIQUE: Non-contrast CT of the chest was initially obtained. Multidetector CT imaging through the chest, abdomen and pelvis was performed using the standard protocol during bolus administration of intravenous contrast. Multiplanar reconstructed images and MIPs were obtained and reviewed to evaluate the vascular anatomy. RADIATION DOSE REDUCTION: This exam was performed according to the departmental dose-optimization program which includes automated exposure control, adjustment of the mA and/or kV according to patient size and/or use of  iterative reconstruction technique. CONTRAST:  53m OMNIPAQUE IOHEXOL 350 MG/ML SOLN COMPARISON:  Chest radiograph earlier today. Chest, abdomen, pelvis CT 10/15/2019 FINDINGS: CTA CHEST FINDINGS Cardiovascular: No aortic hematoma on this unenhanced exam. Thoracic aorta is normal in caliber with mild tortuosity. Mild aortic atherosclerosis. No dissection, aneurysm or acute aortic findings. Aberrant right subclavian artery. No central pulmonary embolus on this exam not tailored to pulmonary artery assessment. Borderline cardiomegaly. Minimal pericardial effusion. Mediastinum/Nodes: No enlarged mediastinal or hilar lymph nodes. There is bulky bilateral axillary adenopathy. Index right axillary node measures 2.1 cm short axis series 4, image 21. Left axillary node measures 2.5 cm short axis series 4, image 33. 11 mm left supraclavicular node is stable. Similar appearance of heterogeneous right lobe of the thyroid. No esophageal wall thickening. Lungs/Pleura: Minor atelectasis in the lingula. No other focal airspace disease. No pleural fluid. No features of pulmonary edema. No pulmonary mass or nodule. The trachea and central airways are patent. Musculoskeletal: Heterogeneous appearance of the osseous structures but no discrete focal lesion. No acute osseous findings. Review of the MIP images confirms the above findings. CTA ABDOMEN AND PELVIS FINDINGS VASCULAR Aorta: Moderate atherosclerosis. Normal in caliber without aneurysm, dissection, vasculitis or significant stenosis. Celiac: Mild plaque at the origin with minimal stenosis. Branch vessels are patent. No dissection or acute findings. SMA: Patent without evidence of aneurysm, dissection, vasculitis or significant stenosis. Renals: Single bilateral renal arteries. Mild calcified plaque at the origin of the both renal arteries with minimal stenosis. No dissection or acute findings. IMA: Patent without evidence of aneurysm, dissection, vasculitis or significant  stenosis. Inflow: Moderate atherosclerosis. No aneurysm, dissection or acute findings. Veins: No obvious venous abnormality  within the limitations of this arterial phase study. Review of the MIP images confirms the above findings. NON-VASCULAR Hepatobiliary: No focal hepatic abnormality on this arterial phase exam. Or gallbladder Pancreas: No ductal dilatation or inflammation. Spleen: Normal in size and arterial enhancement. Adrenals/Urinary Tract: No adrenal nodule. Heterogeneous enhancement of both kidneys with mild perinephric edema. Loss of corticomedullary differentiation on the right. No frank hydronephrosis. 4.3 cm mildly complex cyst arising from the lower pole of the left kidney, suboptimally assessed on this arterial phase exam. Thin internal septations are seen. Mild bladder distension but no wall thickening. Stomach/Bowel: No bowel obstruction or inflammation. Scattered colonic diverticula without diverticulitis. Normal appendix. Lymphatic: Retroperitoneal adenopathy with progression from prior exam. Dominant node is at the right common iliac station measuring 2.9 cm short axis series 4, image 143. This node previously measured 2.5 cm. Lymph node at the level of the upper right kidney measures 18 mm short axis series 4, image 88. Anterior periaortic node measures 2.1 cm series 4, image 103. There also enlarged peripancreatic and periportal nodes. Mesenteric adenopathy is suboptimally assessed on this arterial phase exam, but mesenteric lymph nodes have increased in size from prior. There also ileocolic nodes are enlarged. Bilateral pelvic adenopathy, 17 mm left external iliac node series 4, image 167, 14 mm right external iliac node series 4, image 166. Reproductive: Status post hysterectomy. No adnexal masses. Other: No ascites or free air. Small fat containing umbilical hernia. Musculoskeletal: The bones are diffusely heterogeneous. No acute osseous finding. No discrete osseous lesion. Bilateral hip  osteoarthritis. Review of the MIP images confirms the above findings. IMPRESSION: 1. No aortic dissection or acute aortic abnormality. Moderate aortic atherosclerosis. 2. Bulky bilateral axillary, mesenteric, and retroperitoneal adenopathy, consistent with history of CLL. Adenopathy is progressed from 2021 exam. 3. Heterogeneous enhancement of both kidneys with mild perinephric edema, suspicious for pyelonephritis. Recommend correlation with urinalysis. 4. Colonic diverticulosis without diverticulitis. Aortic Atherosclerosis (ICD10-I70.0). Electronically Signed   By: Keith Rake M.D.   On: 05/29/2022 15:17   DG Chest 2 View  Result Date: 05/29/2022 CLINICAL DATA:  Dizziness. EXAM: CHEST - 2 VIEW COMPARISON:  Chest CT dated 10/15/2019 FINDINGS: Stable enlarged heart and tortuous and calcified thoracic aorta. Mild linear scarring in the lingula without significant change. Otherwise, clear lungs with normal vascularity. Mild thoracic spine degenerative changes. IMPRESSION: No acute abnormality. Stable cardiomegaly. Electronically Signed   By: Claudie Revering M.D.   On: 05/29/2022 12:29   CT Head Wo Contrast  Result Date: 05/29/2022 CLINICAL DATA:  Dizziness.  Numbness.  TIAs. EXAM: CT HEAD WITHOUT CONTRAST TECHNIQUE: Contiguous axial images were obtained from the base of the skull through the vertex without intravenous contrast. RADIATION DOSE REDUCTION: This exam was performed according to the departmental dose-optimization program which includes automated exposure control, adjustment of the mA and/or kV according to patient size and/or use of iterative reconstruction technique. COMPARISON:  09/15/2021 FINDINGS: Brain: No evidence of intracranial hemorrhage, acute infarction, hydrocephalus, extra-axial collection, or mass lesion/mass effect. Old lacunar infarct noted in left superior cerebellum. Vascular:  No hyperdense vessel or other acute findings. Skull: No evidence of fracture or other significant bone  abnormality. Sinuses/Orbits:  No acute findings. Other: None. IMPRESSION: No acute intracranial abnormality. Electronically Signed   By: Marlaine Hind M.D.   On: 05/29/2022 12:23    Scheduled Meds:  aspirin EC  81 mg Oral Daily   atorvastatin  80 mg Oral Daily   cilostazol  50 mg Oral BID   clopidogrel  75 mg Oral Daily   enoxaparin (LOVENOX) injection  40 mg Subcutaneous Q24H   ezetimibe  10 mg Oral Daily   FLUoxetine  20 mg Oral Daily   insulin aspart  0-15 Units Subcutaneous TID PC & HS   insulin aspart  4 Units Subcutaneous TID WC   insulin glargine-yfgn  15 Units Subcutaneous Daily   pantoprazole  40 mg Oral Daily   Continuous Infusions:  sodium chloride 75 mL/hr at 05/31/22 0845     LOS: 1 day   Shelly Coss, MD [facility] P1/30/2024, 11:18 AM

## 2022-05-31 NOTE — Progress Notes (Signed)
Speech Language Pathology Treatment: Cognitive-Linquistic  Patient Details Name: [name] MRN: [id number] DOB: 06/27/1960 Today's Date: 05/31/2022 Time: 1350-1400 SLP Time Calculation (min) (ACUTE ONLY): 10 min  Assessment / Plan / Recommendation Clinical Impression  Separate session to address pt's mild dysarthria *that may be at baseline but impacts speech intelligibility.  Pt ablet o demonstrate increased phonatory strength with demonstration and return demonstration.  Sister reports pt's speech is much improved today compared to yesterday.     Advised pt turn off the television to eliminate all background noise to improve listeners comprehension of her speech.   Suspect pt may have some baseline dysarthria from her prior CVAs for which her family has comprehended.  Using teach back, pt educated and SLP wrote notes on her white board to use for reference.     HPI HPI: [name] is a 62 y.o. female with medical history significant of non-Hodgkin's lymphoma, HTN, Guillain-Barre syndrome (1988), multiple strokes (2010,01/2021, 08/2021), uncontrolled Type 2 diabetes, HLD, PAD, CKD 3a who presents from outside ED with concerns of dizziness and bilateral lower extremity weakness.  Her imaging showed "small right medullary CVA".  Starting 05/30/2022 evening, she reported worsening symptoms of LUL and LLL weakness - inability to move limbs today. Prior CVA 08/2021 SLUMS 12/20 was score showing difficulty with executive functioning, mental manipulation.  Pt lives by herself but family is working out support for her 24/7.  CT scan negative for new CVA.  Speech eval ordered.      SLP Plan     All further Speech Lanaguage Pathology  needs can be addressed in the next venue of care   Recommendations for follow up therapy are one component of a multi-disciplinary discharge planning process, led by the attending physician.  Recommendations may be updated based on patient status, additional functional  criteria and insurance authorization.    Recommendations      AIR             Assistance recommended at discharge: Set up Supervision/Assistance SLP Visit Diagnosis: Dysarthria and anarthria (R47.1)         Kathleen Lime, MS [facility] SLP Acute Rehab Services Office [phone]   Macario Golds  05/31/2022, 2:48 PM

## 2022-05-31 NOTE — Progress Notes (Signed)
Brief Neuro Update:  Stroke workup completed. She is on dual antiplatelet with plavix and pletal at home. No need to add another antiplatelet agent at this time.  Echo neg. HbA1c elevated to 11.4. LDL elevated to 120s despite atorvastatin '80mg'$  daily along with zetia '10mg'$  daily.  Vessel imaging demonstrated multifocal multivessel stenosis, consistent with her history of peripheral arterial disease.  Recs: - follow up with stroke clinic outpatient - follow up with cardiology run lipid clinic to see if she would benefit from PCSK9 inhibitors given elevated LDL despite '80mg'$  of atorvastatin and '10mg'$  of Zetia.  [place] Pager Number [phone]

## 2022-05-31 NOTE — Progress Notes (Signed)
Echocardiogram 2D Echocardiogram has been performed.  [name] 05/31/2022, 8:45 AM

## 2022-05-31 NOTE — Evaluation (Signed)
Speech Language Pathology Evaluation Patient Details Name: [name] MRN: [id number] DOB: [birth year] Today's Date: 05/31/2022 Time: 4166-0630 SLP Time Calculation (min) (ACUTE ONLY): 37 min  Problem List:  Patient Active Problem List   Diagnosis Date Noted   Dizziness 05/29/2022   Personal history of CLL (chronic lymphocytic leukemia) 05/29/2022   Abdominal pain 05/29/2022   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 disease, with long-term current use of insulin ([place]) 04/14/2022   Type 2 diabetes mellitus with diabetic polyneuropathy, with long-term current use of insulin ([place]) 04/14/2022   Routine general medical examination at a health care facility 03/16/2022   Hyperlipidemia LDL goal <70 10/01/2021   Stroke ([place]) 09/17/2021   Stage 3a chronic kidney disease (CKD) ([place]) 09/16/2021   Thyroid nodule 09/16/2021   Cervical lymphadenopathy 09/16/2021   Acute CVA (cerebrovascular accident) ([place]) 09/15/2021   GERD (gastroesophageal reflux disease) 04/30/2021   Peripheral arterial disease ([place]) 04/13/2021   Atherosclerosis of aorta ([place]) 02/14/2021   Cerebral thrombosis with cerebral infarction 02/08/2021   Abnormal MRI of head 02/07/2021   Vertigo 02/07/2021   Class 1 obesity due to excess calories with body mass index (BMI) of 31.0 to 31.9 in adult 02/07/2021   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   Trigger finger, left ring finger 10/04/2018   Alternating constipation and diarrhea 04/20/2018   Diabetic peripheral neuropathy associated with type 2 diabetes mellitus ([place]) 02/26/2018   DM (diabetes mellitus), type 2 with peripheral vascular complications ([place]) 16/05/930   ABNORMALITY OF GAIT 05/10/2010   ECZEMA, ATOPIC 04/03/2009   Hyperlipidemia associated with type 2 diabetes mellitus ([place]) 03/06/2009   BACK PAIN 11/14/2008   Cerebrovascular disease 08/05/2008   DIPLOPIA 07/15/2008   Essential hypertension 07/15/2008   Past Medical History:  Past  Medical History:  Diagnosis Date   Abnormality of gait 05/10/2010   BACK PAIN 11/14/2008   Class 1 obesity due to excess calories with body mass index (BMI) of 31.0 to 31.9 in adult 02/07/2021   DIABETES MELLITUS, TYPE II 07/15/2008   Diplopia 07/15/2008   ECZEMA, ATOPIC 04/03/2009   GERD (gastroesophageal reflux disease)    Guillain-Barre ([place]) 1988   HYPERLIPIDEMIA 03/06/2009   HYPERTENSION 07/15/2008   Stroke ([place]) 2010, 2011   x2    Vertebral artery stenosis    Past Surgical History:  Past Surgical History:  Procedure Laterality Date   ABDOMINAL HYSTERECTOMY     DILATION AND CURETTAGE OF UTERUS     FOOT SURGERY     IR ANGIO INTRA EXTRACRAN SEL COM CAROTID INNOMINATE UNI L MOD SED  09/17/2021   IR ANGIO INTRA EXTRACRAN SEL INTERNAL CAROTID UNI R MOD SED  09/17/2021   IR ANGIO VERTEBRAL SEL VERTEBRAL UNI R MOD SED  09/17/2021   IR US GUIDE VASC ACCESS RIGHT  09/17/2021   HPI:  [name] is a 62 y.o. female with medical history significant of non-Hodgkin's lymphoma, HTN, Guillain-Barre syndrome (1988), multiple strokes (2010,01/2021, 08/2021), uncontrolled Type 2 diabetes, HLD, PAD, CKD 3a who presents from outside ED with concerns of dizziness and bilateral lower extremity weakness.  Her imaging showed "small right medullary CVA".  Starting 05/30/2022 evening, she reported worsening symptoms of LUL and LLL weakness - inability to move limbs today. Prior CVA 08/2021 SLUMS 12/20 was score showing difficulty with executive functioning, mental manipulation.  Pt lives by herself but family is working out support for her 24/7.  CT scan negative for new CVA.  Speech eval ordered.   Assessment /  Plan / Recommendation Clinical Impression  Short Blessed Test *SBT* administered to pt resulting in her scoring having 2 errors - with resultant score of 4 - indicative of normal cognition per testing authors.  Pt required cues to recall 1/5 details of address provided earlier in session and initial  cue to say months of the year in backward order.  She also was able to verbalize her current address as well as name 4 different medications she is taking.  She demonstrates mild lingual deviation to the left upon lingual protrusion and is minimal dysarthria with imprecise articulation and lower amplitude.  Pt's sister, Hassan Rowan and her best friend, Margaree Mackintosh, were present and report her current speech is baseline.   SLP recommends follow up at next venue of care to assure pt's cogntiive linguistic skills are adequate to allow indepedent completion of complex ADLs.    SLP Assessment  SLP Recommendation/Assessment: All further Speech Lanaguage Pathology  needs can be addressed in the next venue of care SLP Visit Diagnosis: Dysarthria and anarthria (R47.1)    Recommendations for follow up therapy are one component of a multi-disciplinary discharge planning process, led by the attending physician.  Recommendations may be updated based on patient status, additional functional criteria and insurance authorization.    Follow Up Recommendations    AIR   Assistance Recommended at Discharge  Set up Supervision/Assistance  Functional Status Assessment Patient has had a recent decline in their functional status and demonstrates the ability to make significant improvements in function in a reasonable and predictable amount of time.  Frequency and Duration min 1 x/week  1 week      SLP Evaluation Cognition  Overall Cognitive Status: Within Functional Limits for tasks assessed Arousal/Alertness: Awake/alert Orientation Level: Oriented to place;Oriented to person;Oriented to time;Oriented to situation Year: 2024 Month: January Day of Week: Correct Attention: Sustained Sustained Attention: Appears intact Memory: Impaired (recalled correct address except one component of address recall - 1/5 components) Awareness: Impaired Awareness Impairment: Intellectual impairment (stated she thought she could get up  independently but with delay, she was able to articulate fall risk) Problem Solving: Appears intact       Comprehension  Auditory Comprehension Overall Auditory Comprehension: Appears within functional limits for tasks assessed Yes/No Questions: Not tested Commands: Within Functional Limits Conversation: Complex Visual Recognition/Discrimination Discrimination: Not tested Reading Comprehension Reading Status: Within funtional limits (for tasks assessed - reading board "Call, don't fall")    Expression Expression Primary Mode of Expression: Verbal Verbal Expression Overall Verbal Expression: Appears within functional limits for tasks assessed Initiation: No impairment Repetition: No impairment Naming: Not tested Pragmatics: No impairment Non-Verbal Means of Communication: Not applicable Written Expression Dominant Hand: Right Written Expression:  (DNT)   Oral / Motor  Oral Motor/Sensory Function Overall Oral Motor/Sensory Function: Within functional limits Facial ROM: Within Functional Limits Facial Symmetry: Within Functional Limits Facial Strength: Within Functional Limits Facial Sensation: Within Functional Limits Lingual ROM: Reduced left Lingual Symmetry: Abnormal symmetry left (slight deviation to the left upon protrusion) Lingual Strength: Within Functional Limits Lingual Sensation: Suspected CN VII (facial) dysfunction-anterior 2/3 tongue Velum: Within Functional Limits Mandible: Within Functional Limits Motor Speech Overall Motor Speech: Impaired Respiration: Impaired Level of Impairment: Phrase Phonation: Low vocal intensity Resonance: Within functional limits Articulation: Within functional limitis Level of Impairment: Phrase Intelligibility: Intelligibility reduced Word: 75-100% accurate Phrase: 75-100% accurate Sentence: 75-100% accurate Conversation: 75-100% accurate Motor Planning: Witnin functional limits Motor Speech Errors: Not applicable  Macario Golds 05/31/2022, 2:41 PM Kathleen Lime, MS [facility] SLP Acute Rehab Services Office [phone]

## 2022-06-01 DIAGNOSIS — E1151 Type 2 diabetes mellitus with diabetic peripheral angiopathy without gangrene: Secondary | ICD-10-CM

## 2022-06-01 DIAGNOSIS — N179 Acute kidney failure, unspecified: Secondary | ICD-10-CM

## 2022-06-01 DIAGNOSIS — R42 Dizziness and giddiness: Secondary | ICD-10-CM | POA: Diagnosis not present

## 2022-06-01 DIAGNOSIS — I10 Essential (primary) hypertension: Secondary | ICD-10-CM

## 2022-06-01 DIAGNOSIS — I639 Cerebral infarction, unspecified: Secondary | ICD-10-CM | POA: Diagnosis not present

## 2022-06-01 DIAGNOSIS — Z856 Personal history of leukemia: Secondary | ICD-10-CM

## 2022-06-01 DIAGNOSIS — E1169 Type 2 diabetes mellitus with other specified complication: Secondary | ICD-10-CM

## 2022-06-01 DIAGNOSIS — R1012 Left upper quadrant pain: Secondary | ICD-10-CM

## 2022-06-01 DIAGNOSIS — E785 Hyperlipidemia, unspecified: Secondary | ICD-10-CM

## 2022-06-01 LAB — BASIC METABOLIC PANEL
Anion gap: 8 (ref 5–15)
BUN: 26 mg/dL — ABNORMAL HIGH (ref 8–23)
CO2: 23 mmol/L (ref 22–32)
Calcium: 9.1 mg/dL (ref 8.9–10.3)
Chloride: 109 mmol/L (ref 98–111)
Creatinine, Ser: 1.81 mg/dL — ABNORMAL HIGH (ref 0.44–1.00)
GFR, Estimated: 31 mL/min — ABNORMAL LOW (ref >60)
Glucose, Bld: 183 mg/dL — ABNORMAL HIGH (ref 70–99)
Potassium: 4.1 mmol/L (ref 3.5–5.1)
Sodium: 140 mmol/L (ref 135–145)

## 2022-06-01 LAB — GLUCOSE, CAPILLARY
Glucose-Capillary: 188 mg/dL — ABNORMAL HIGH (ref 70–99)
Glucose-Capillary: 173 mg/dL — ABNORMAL HIGH (ref 70–99)
Glucose-Capillary: 159 mg/dL — ABNORMAL HIGH (ref 70–99)
Glucose-Capillary: 95 mg/dL (ref 70–99)

## 2022-06-01 MED ORDER — ONDANSETRON HCL 4 MG/2ML IJ SOLN
4.0000 mg | Freq: Four times a day (QID) | INTRAMUSCULAR | Status: DC | PRN
  Administered 2022-06-01: 4 mg via INTRAVENOUS
  Filled 2022-06-01: qty 2

## 2022-06-01 MED ORDER — AMLODIPINE BESYLATE 10 MG PO TABS
10.0000 mg | ORAL_TABLET | Freq: Every day | ORAL | Status: DC
  Administered 2022-06-01 – 2022-06-03 (×3): 10 mg via ORAL
  Filled 2022-06-01 (×3): qty 1

## 2022-06-01 MED ORDER — INSULIN GLARGINE-YFGN 100 UNIT/ML ~~LOC~~ SOLN
15.0000 [IU] | Freq: Two times a day (BID) | SUBCUTANEOUS | Status: DC
  Administered 2022-06-01 – 2022-06-03 (×5): 15 [IU] via SUBCUTANEOUS
  Filled 2022-06-01 (×6): qty 0.15

## 2022-06-01 MED ORDER — METOPROLOL SUCCINATE ER 50 MG PO TB24
50.0000 mg | ORAL_TABLET | Freq: Every day | ORAL | Status: DC
  Administered 2022-06-01 – 2022-06-02 (×2): 50 mg via ORAL
  Filled 2022-06-01 (×2): qty 1

## 2022-06-01 NOTE — Progress Notes (Signed)
Inpatient Rehab Admissions Coordinator:    Note that PT/OT noted are in, but Pt. Was not able to attempt OOB today due to elevated BP. At this time, I she does not appear able to tolerate the intensity of CIR; however, if she progresses over the next few days with therapies, may re-consider admit. If she is ready to d/c before that, then recommend TOC look at a lower intensity rehab venue.   Clemens Catholic, [employer], [place] Admissions Coordinator  [phone] ([place]) [phone] (office)

## 2022-06-01 NOTE — Progress Notes (Signed)
Occupational Therapy Treatment Patient Details Name: [name] MRN: [id number] DOB: [birth year] Today's Date: 06/01/2022   History of present illness Patient is a 62 year old female who presented with BLE weakness and dizziness. Patient was admitted with dizziness, abdominal pain. patients MRI results showed "acute ischemic stroke on the right aspect of medulla, multivessel disease". on 1/29 noted increased weakness in LUE and LLE than earlier in day.  PMH: CLL, DM II, HTN, hyperlipidemia, CVA,guillain-Barre,diplopia,   OT comments  Patient was noted to have increased weakness in LUE and LLE compared to evaluation. Patient needed increased A for bed mobility, ADLs and AROM of LUE. Patient was noted to have increased dizziness with minimal movement that impacted session. Patient would continue to benefit from skilled OT services at this time while admitted and after d/c to address noted deficits in order to improve overall safety and independence in ADLs.   Blood pressures: Supine 189/90 mmhg MAP 120 HR 91 bpm Sitting 208/99 mmhg MAP 128 HR 93 bpm Sitting after 3 mins 207/115 mmhg MAP 138 with patient reporting continued dizziness. Supine in bed 198/102 mmhg MAP 126 HR 82 bpm    Recommendations for follow up therapy are one component of a multi-disciplinary discharge planning process, led by the attending physician.  Recommendations may be updated based on patient status, additional functional criteria and insurance authorization.    Follow Up Recommendations  Acute inpatient rehab (3hours/day)     Assistance Recommended at Discharge Frequent or constant Supervision/Assistance  Patient can return home with the following  Two people to help with walking and/or transfers;A lot of help with bathing/dressing/bathroom;Assistance with cooking/housework;Direct supervision/assist for medications management;Assist for transportation;Help with stairs or ramp for entrance;Direct supervision/assist  for financial management   Equipment Recommendations  Other (comment) (defer to next venue)       Precautions / Restrictions Precautions Precautions: Fall Restrictions Weight Bearing Restrictions: No       Mobility Bed Mobility Overal bed mobility: Needs Assistance Bed Mobility: Supine to Sit, Sit to Supine     Supine to sit: Max assist Sit to supine: Max assist   General bed mobility comments: increased time and effort         Balance Overall balance assessment: Needs assistance Sitting-balance support: No upper extremity supported Sitting balance-Leahy Scale: Poor Sitting balance - Comments: lean to L when not supported   Standing balance support: Bilateral upper extremity supported   Standing balance comment: uanble to attempt today with BP too high with sitting EOB           ADL either performed or assessed with clinical judgement   ADL Overall ADL's : Needs assistance/impaired     General ADL Comments: unable to focus on ADLs during session with goal to get into recliner with new weakness on LUE and LLE. patient was noted to ahve increased dizziness severely limitng session. see BPs above.    Extremity/Trunk Assessment Upper Extremity Assessment Upper Extremity Assessment: LUE deficits/detail LUE Deficits / Details: Patient was noted to have ability to grossly grasp but 3/5 compared to previous session. patient unable to touch head with ABDuction sititng EOB with patient about to engage in about 60 degrees AAROM of FF compared to last session with increased resistance noted. patient able to AROM elbow. LUE Coordination: decreased fine motor;decreased gross motor   Lower Extremity Assessment Lower Extremity Assessment: Defer to PT evaluation LLE Deficits / Details: ankle dorsiflexion 0/5, plantarflexion 0/5, quad 3/5, hip flexor 3/5, hip abduction 2-/5, hip adduction  2/5   Cervical / Trunk Assessment Cervical / Trunk Assessment: Normal     Cognition  Arousal/Alertness: Awake/alert Behavior During Therapy: WFL for tasks assessed/performed Overall Cognitive Status: Within Functional Limits for tasks assessed                                 General Comments: alert and oriented          Home Living   Living Arrangements: Alone Available Help at Discharge: Family Type of Home: Apartment Home Access: Level entry   Entrance Stairs-Rails: None Home Layout: One level     Bathroom Shower/Tub: Teacher, early years/pre: Standard Bathroom Accessibility: Yes How Accessible: Accessible via walker        Lives With: Alone    Prior Functioning/Environment              Frequency  Min 2X/week        Progress Toward Goals  OT Goals(current goals can now be found in the care plan section)  Progress towards OT goals: OT to reassess next treatment (patient had a change in status but goals have not changed at this time.)     Plan Discharge plan remains appropriate    Co-evaluation    PT/OT/SLP Co-Evaluation/Treatment: Yes Reason for Co-Treatment: To address functional/ADL transfers PT goals addressed during session: Mobility/safety with mobility OT goals addressed during session: ADL's and self-care      AM-PAC OT "6 Clicks" Daily Activity     Outcome Measure   Help from another person eating meals?: A Little Help from another person taking care of personal grooming?: A Little Help from another person toileting, which includes using toliet, bedpan, or urinal?: A Lot Help from another person bathing (including washing, rinsing, drying)?: A Lot Help from another person to put on and taking off regular upper body clothing?: A Lot Help from another person to put on and taking off regular lower body clothing?: A Lot 6 Click Score: 14    End of Session    OT Visit Diagnosis: Unsteadiness on feet (R26.81);Other abnormalities of gait and mobility (R26.89);Muscle weakness (generalized) (M62.81)    Activity Tolerance Other (comment) (dizziness)   Patient Left in bed;with call bell/phone within reach;with bed alarm set   Nurse Communication Mobility status;Other (comment) (BPs during session)        Time: 2440-1027 OT Time Calculation (min): 33 min  Charges: OT General Charges $OT Visit: 1 Visit OT Treatments $Self Care/Home Management : 8-22 mins  Rennie Plowman, MS Acute Rehabilitation Department Office# [phone]   [name] 06/01/2022, 10:10 AM

## 2022-06-01 NOTE — PMR Pre-admission (Signed)
PMR Admission Coordinator Pre-Admission Assessment  Patient: [name] is an 62 y.o., female MRN: [id number] DOB: 1961/03/07 Height: '5\' 4"'$  (162.6 cm) Weight: 81.2 kg  Insurance Information HMO:yes     PPO:  PCP:      IPA:      80/20:      OTHER:  PRIMARY: [employer]      Policy#: [id number]   Subscriber: none CM Name: Kenton Kingfisher: [phone] [id number], B:[phone] Pre-Cert#: [id number]      Employer:  [employer] called 2/2 and approved for 7 days 2/3-2/10, review due to [place], [place]: [phone] [id number], [phone] Eff Date: 01/30/2022- 05/02/2023 Deductible: does not have one OOP Max: $1,800 ($45 met) CIR: 75% coverage, 25% co-insurance SNF: 75% coverage, 25% co-insurance Outpatient: 75% coverage, 25% co-insurance; limited to 30 combined visits/cal yr (30 remaining) Home Health:  75% coverage, 25% co-insurance DME: 75% coverage, 25% co-insurance Providers: in network   SECONDARY: U[employer] Medicaid       Policy#: [id number]     Phone#:   Financial Counselor:       Phone#:   The "Data Collection Information Summary" for patients in Inpatient Rehabilitation Facilities with attached "Privacy Act [place] Records" was provided and verbally reviewed with: Patient and Family  Emergency Contact Information Contact Information     Name Relation Home Work Mobile   [place] Sister [phone]  [phone]   Gala Murdoch Niece   [phone]       Current Medical History  Patient Admitting Diagnosis: CVA  History of Present Illness:  [name] is a 62 year old female with history of T2DM, GBS, diplopia, HTN, 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 She also reported abdominal pain that ws treated with GI cocktail. CTA chest/abdomen/pelvis was negative for dissection, moderate aortic atherosclerosis,  heterogenous enhancement of both kidneys with mild perinephric edema suspicious of pyelo and bulky adenopathy c/w CLL-progressed from 2021 exam.  UA felt to be negative for UTI. 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    Neurology felt that plavix/Pletal adequate and no need to add another anti-platelet. To refer to cardiology lipid clinic for elevated LDL despite statin/Zetia as well as hydroxyzine for nerve/odd sensation in body. BP medications resumed 02/02 and therapy ongoing. Patient noted to have cognitive deficits with mild dysarthria, dizziness with activity, left hemiparesis with left knee instability and fatigue. CIR recommended due to functional decline.     Complete NIHSS TOTAL: 10  Patient's medical [facility]  has been reviewed by the rehabilitation admission coordinator and physician.  Past Medical History  Past Medical History:  Diagnosis Date   Abnormality of gait 05/10/2010   BACK PAIN 11/14/2008   Class 1 obesity due to excess calories with body mass index (BMI) of 31.0 to 31.9 in adult 02/07/2021   DIABETES MELLITUS, TYPE II 07/15/2008   Diplopia 07/15/2008   ECZEMA, ATOPIC 04/03/2009   GERD (gastroesophageal reflux disease)    Guillain-Barre ([place]) 1988   HYPERLIPIDEMIA 03/06/2009   HYPERTENSION 07/15/2008   Stroke ([place]) 2010, 2011   x2    Vertebral artery stenosis     Has the patient had major surgery during 100 days prior to admission? No  Family History   family history  includes Asthma in an other family member; Cancer in her father; Diabetes in her mother and sister; Hypertension in an other family member; Stroke in her mother and another family member.  Current Medications  Current Facility-Administered Medications:    0.9 %  sodium chloride infusion, , Intravenous,  Continuous, Adhikari, Amrit, MD, Last Rate: 75 mL/hr at 06/01/22 0758, Infusion Verify at 06/01/22 0758   acetaminophen (TYLENOL) tablet 650 mg, 650 mg, Oral, Q4H PRN **OR** acetaminophen (TYLENOL) 160 MG/5ML solution 650 mg, 650 mg, Per Tube, Q4H PRN **OR** acetaminophen (TYLENOL) suppository 650 mg, 650 mg, Rectal, Q4H PRN, Tu, Ching T, DO   ALPRAZolam (XANAX) tablet 0.25 mg, 0.25 mg, Oral, TID PRN, Shelly Coss, MD, 0.25 mg at 05/30/22 2026   atorvastatin (LIPITOR) tablet 80 mg, 80 mg, Oral, Daily, Tu, Ching T, DO, 80 mg at 05/31/22 1011   cilostazol (PLETAL) tablet 50 mg, 50 mg, Oral, BID, Tu, Ching T, DO, 50 mg at 05/31/22 2148   clopidogrel (PLAVIX) tablet 75 mg, 75 mg, Oral, Daily, Tu, Ching T, DO, 75 mg at 05/31/22 1011   enoxaparin (LOVENOX) injection 40 mg, 40 mg, Subcutaneous, Q24H, Tu, Ching T, DO, 40 mg at 05/31/22 1011   ezetimibe (ZETIA) tablet 10 mg, 10 mg, Oral, Daily, Tu, Ching T, DO, 10 mg at 05/31/22 1012   FLUoxetine (PROZAC) capsule 20 mg, 20 mg, Oral, Daily, Tu, Ching T, DO, 20 mg at 05/31/22 1012   hydrOXYzine (ATARAX) tablet 25 mg, 25 mg, Oral, Q6H PRN, de Yolanda Manges, Cortney E, NP, 25 mg at 05/30/22 1458   insulin aspart (novoLOG) injection 0-15 Units, 0-15 Units, Subcutaneous, TID PC & HS, Tu, Ching T, DO, 3 Units at 06/01/22 0807   insulin aspart (novoLOG) injection 4 Units, 4 Units, Subcutaneous, TID WC, Adhikari, Amrit, MD, 4 Units at 06/01/22 0806   insulin glargine-yfgn (SEMGLEE) injection 15 Units, 15 Units, Subcutaneous, Daily, Adhikari, Amrit, MD, 15 Units at 05/31/22 1012   labetalol (NORMODYNE) injection 10 mg, 10 mg, Intravenous, Q2H PRN, Kathryne Eriksson, NP   Oral care mouth rinse, 15 mL, Mouth Rinse, PRN, Tu, Ching T, DO   pantoprazole (PROTONIX) EC tablet 40 mg, 40 mg, Oral, Daily, Tu, Ching T, DO, 40 mg at 05/31/22 1011  Patients Current Diet:  Diet Order             Diet Heart Room service appropriate? Yes; Fluid consistency: Thin  Diet effective now                    Precautions / Restrictions Precautions Precautions: Fall Restrictions Weight Bearing Restrictions: No   Has the patient had 2 or more falls or a fall with injury in the past year? No  Prior Activity Level Community (5-7x/wk): Pt. active in the community PTA  Prior Functional Level Self Care: Did the patient need help bathing, dressing, using the toilet or eating? Independent  Indoor Mobility: Did the patient need assistance with walking from room to room (with or without device)? Independent  Stairs: Did the patient need assistance with internal or external stairs (with or without device)? Independent  Functional Cognition: Did the patient need help planning regular tasks such as shopping or remembering to take medications? Independent  Patient Information Are you of Hispanic, Latino/a,or Spanish origin?: A. No, not of Hispanic, Latino/a, or Spanish origin What is your race?: B. Black or African American Do you need or want an interpreter to communicate with a doctor or health care staff?:  0. No  Patient's Response To:  Health Literacy and Transportation Is the patient able to respond to health literacy and transportation needs?: Yes Health Literacy - How often do you need to have someone help you when you read instructions, pamphlets, or other written material from your doctor or pharmacy?: Never In the past 12 months, has lack of transportation kept you from medical appointments or from getting medications?: No In the past 12 months, has lack of transportation kept you from meetings, work, or from getting things needed for daily living?: No  Home Assistive Devices / [place] Devices/Equipment: Radio producer (specify quad or straight), Eyeglasses Home Equipment: Rolling Walker (2 wheels)  Prior Device Use: Indicate devices/aids used by the patient prior to current illness, exacerbation or injury? None of the above  Current Functional  Level Cognition  Arousal/Alertness: Awake/alert Overall Cognitive Status: Within Functional Limits for tasks assessed Orientation Level: Oriented X4 Attention: Sustained Sustained Attention: Appears intact Memory: Impaired (recalled correct address except one component of address recall - 1/5 components) Awareness: Impaired Awareness Impairment: Intellectual impairment (stated she thought she could get up independently but with delay, she was able to articulate fall risk) Problem Solving: Appears intact    Extremity Assessment (includes Sensation/Coordination)  Upper Extremity Assessment: LUE deficits/detail LUE Deficits / Details: noted to have decreased strength compared to RUE. unable to AROM FF with ABDuction to touch head. patient was able to participate in AAROM to about 80 degrees with resistance noted with attempted FF. patient unable to participate in finger opposition on digits. able to grossly grasp objects. LUE Coordination: decreased gross motor, decreased fine motor  Lower Extremity Assessment: Defer to PT evaluation LLE Deficits / Details: ankle dorsiflexion 0/5, plantarflexion 0/5, quad 3/5, hip flexor 3/5, hip abduction 2-/5, hip adduction 2/5 LLE Sensation: decreased light touch LLE Coordination: decreased fine motor    ADLs  Overall ADL's : Needs assistance/impaired Eating/Feeding: Sitting Eating/Feeding Details (indicate cue type and reason): EOB with set up opening small containers. Grooming: Sitting, Minimal assistance Upper Body Bathing: Moderate assistance, Sitting Lower Body Bathing: Moderate assistance, Sit to/from stand, Sitting/lateral leans Upper Body Dressing : Sitting, Minimal assistance Lower Body Dressing: Moderate assistance, Sit to/from stand, Sitting/lateral leans Lower Body Dressing Details (indicate cue type and reason): to don undergarments with noted buckling of LLE with standing. patient appeared to have decreased awareness of deficits. Toilet  Transfer: Maximal assistance, Rolling walker (2 wheels), [employer] Transfer Details (indicate cue type and reason): patient was able to scoot up to [facility] to R side with increased time. Toileting- Clothing Manipulation and Hygiene: Total assistance, Sit to/from stand    Mobility  Overal bed mobility: Needs Assistance Bed Mobility: Supine to Sit, Sit to Supine Supine to sit: Min guard, HOB elevated Sit to supine: Min guard General bed mobility comments: increased time and effort    Transfers  Overall transfer level: Needs assistance Equipment used: Rolling walker (2 wheels) Transfers: Sit to/from Stand Sit to Stand: Min assist General transfer comment: cues for hand placement, and balance support, took orthostatic vitals standing in front of the bed    Ambulation / Gait / Stairs / Wheelchair Mobility  Ambulation/Gait General Gait Details: NT due to high BP with standing and L LE weakness    Posture / Balance Balance Overall balance assessment: Needs assistance Sitting-balance support: No upper extremity supported Sitting balance-Leahy Scale: Fair Standing balance support: Bilateral upper extremity supported Standing balance-Leahy Scale: Poor Standing balance comment: weight sifted over R LE due  to L LE weakness    Special needs/care consideration Special service needs none   Previous Home Environment (from acute therapy documentation) Living Arrangements: Alone  Lives With: Alone Available Help at Discharge: Family Type of Home: Apartment Home Layout: One level Home Access: Level entry Entrance Stairs-Rails: None Entrance Stairs-Number of Steps: 3 Bathroom Shower/Tub: Optometrist: Yes How Accessible: Accessible via walker [place]: No Additional Comments: No falls last 6 months  Discharge Living Setting Plans for Discharge Living Setting: Patient's home, Apartment Type of Home at Discharge:  Apartment Discharge Home Layout: One level Discharge Home Access: Level entry Discharge Bathroom Shower/Tub: None Discharge Bathroom Toilet: Standard Discharge Bathroom Accessibility: Yes How Accessible: Accessible via walker Does the patient have any problems obtaining your medications?: No  Social/Family/Support Systems Patient Roles: Other (Comment) Contact Information: Sarenity Ramaker Anticipated Caregiver: Best friend [phone] Anticipated Caregiver's Contact Information: Pt.'s friend can do nights, Sister states she can assist days, Neice also to assist Ability/Limitations of Caregiver: 24/7 Caregiver Availability: 24/7 Discharge Plan Discussed with Primary Caregiver: Yes Is Caregiver In Agreement with Plan?: Yes Does Caregiver/Family have Issues with Lodging/Transportation while Pt is in Rehab?: No  Goals Patient/Family Goal for Rehab: PT/OT/SLP Min A to Supervision  Expected length of stay: 14-16 days  Pt/Family Agrees to Admission and willing to participate: Yes Program Orientation Provided & Reviewed with Pt/Caregiver Including Roles  & Responsibilities: Yes  Decrease burden of Care through IP rehab admission: none   Possible need for SNF placement upon discharge: none   Patient Condition: I have reviewed medical records from [facility], spoken with CM, and patient and family member. I met with patient at the bedside and discussed via phone for inpatient rehabilitation assessment.  Patient will benefit from ongoing PT, OT, and SLP, can actively participate in 3 hours of therapy a day 5 days of the week, and can make measurable gains during the admission.  Patient will also benefit from the coordinated team approach during an Inpatient Acute Rehabilitation admission.  The patient will receive intensive therapy as well as Rehabilitation physician, nursing, social worker, and care management interventions.  Due to safety, skin/wound care, disease  management, medication administration, pain management, and patient education the patient requires 24 hour a day rehabilitation nursing.  The patient is currently mod +2  with mobility and basic ADLs.  Discharge setting and therapy post discharge at home with home health is anticipated.  Patient has agreed to participate in the Acute Inpatient Rehabilitation Program and will admit today.  Preadmission Screen Completed By:  Genella Mech, 06/01/2022 9:27 AM ______________________________________________________________________   Discussed status with Dr. Tressa Busman  on 1600 at 06/03/22 and received approval for admission today.  Admission Coordinator:  Genella Mech, CCC-SLP, time 815/Date  06/04/22 Assessment/Plan: Diagnosis: Does the need for close, 24 hr/day Medical supervision in concert with the patient's rehab needs make it unreasonable for this patient to be served in a less intensive setting? Yes Co-Morbidities requiring supervision/potential complications: R medullary CVA, AKI on CKD stage III, anemia, uncontrolled DM2, hypertension, CLL, pain management, and Hx GBS Due to safety, skin/wound care, disease management, medication administration, pain management, and patient education, does the patient require 24 hr/day rehab nursing? Yes Does the patient require coordinated care of a physician, rehab nurse, PT, OT, and SLP to address physical and functional deficits in the context of the above medical diagnosis(es)? Yes Addressing deficits in the following areas: balance, endurance, locomotion, strength,  transferring, bathing, dressing, feeding, grooming, toileting, cognition, and speech Can the patient actively participate in an intensive therapy program of at least 3 hrs of therapy 5 days a week? Yes The potential for patient to make measurable gains while on inpatient rehab is good Anticipated functional outcomes upon discharge from inpatient rehab: supervision and min assist PT, supervision and  min assist OT, supervision and min assist SLP Estimated rehab length of stay to reach the above functional goals is: 14-16 days Anticipated discharge destination: Home 10. Overall Rehab/Functional Prognosis: good   MD Signature:  Gertie Gowda, DO 06/04/2022

## 2022-06-01 NOTE — Progress Notes (Signed)
Inpatient Rehab Admissions Coordinator:    CIR following. Await updated PT/OT notes but will open case with insurance after she's been seen. I may be by [facility] to see her this PM.   Clemens Catholic, [place], [place] Admissions Coordinator  [phone] (celll) [phone] (office)

## 2022-06-01 NOTE — Progress Notes (Signed)
Physical Therapy Treatment Patient Details Name: [name] MRN: [id number] DOB: Oct 09, 1960 Today's Date: 06/01/2022   History of Present Illness Patient is a 63 year old female who presented with BLE weakness and dizziness. Patient was admitted with dizziness, abdominal pain. patients MRI results showed "acute ischemic stroke on the right aspect of medulla, multivessel disease". on 1/29 noted increased weakness in LUE and LLE than earlier in day.  PMH: CLL, DM II, HTN, hyperlipidemia, CVA,guillain-Barre,diplopia,    PT Comments    Pt noted to have increased weakness compared to eval. Pt needing max A to sit EOB for trunk and LLE assist and return to supine. Pt needing min A for static unsupported sitting EOB. Pt encouraged into L lean onto UE for increased weight-bearing and overpressure of LLE into floor for increased weight-bearing, assist to prevent LOB. Pt unable to kick LLE while seated EOB. Transfers not attempted due to BP outside permissive  parameters per verbal order from nursing.   MMT: ankle dorsiflexion 0/5, plantarflexion 0/5, quad 3-/5, hip flexor 3/5, hip abduction 2-/5, hip adduction 2/5   Blood pressures: Supine 189/90 mmhg MAP 120 HR 91 bpm Sitting 208/99 mmhg MAP 128 HR 93 bpm Sitting after 3 mins 207/115 mmhg MAP 138 with patient reporting continued dizziness. Supine in bed 198/102 mmhg MAP 126 HR 82 bpm    Recommendations for follow up therapy are one component of a multi-disciplinary discharge planning process, led by the attending physician.  Recommendations may be updated based on patient status, additional functional criteria and insurance authorization.  Follow Up Recommendations  Acute inpatient rehab (3hours/day)     Assistance Recommended at Discharge Intermittent Supervision/Assistance  Patient can return home with the following Assist for transportation;Help with stairs or ramp for entrance;A lot of help with walking and/or transfers;A lot of help with  bathing/dressing/bathroom;Assistance with cooking/housework   Equipment Recommendations  None recommended by PT    Recommendations for Other Services       Precautions / Restrictions Precautions Precautions: Fall Restrictions Weight Bearing Restrictions: No     Mobility  Bed Mobility Overal bed mobility: Needs Assistance Bed Mobility: Supine to Sit, Sit to Supine  Supine to sit: Max assist Sit to supine: Max assist  General bed mobility comments: increased time and effort, assist with trunk and LLE    Transfers  General transfer comment: unable, permissive BP at max with static sitting EOB    Ambulation/Gait    Stairs             Wheelchair Mobility    Modified Rankin (Stroke Patients Only)       Balance Overall balance assessment: Needs assistance Sitting-balance support: No upper extremity supported Sitting balance-Leahy Scale: Poor Sitting balance - Comments: lean to L when not supported  Standing balance comment: unable to attempt today with BP too high with sitting EOB     Cognition Arousal/Alertness: Awake/alert Behavior During Therapy: WFL for tasks assessed/performed Overall Cognitive Status: Within Functional Limits for tasks assessed     Exercises      General Comments        Pertinent Vitals/Pain Pain Assessment Pain Assessment: No/denies pain    Home Living   Living Arrangements: Alone Available Help at Discharge: Family Type of Home: Apartment Home Access: Level entry Entrance Stairs-Rails: None  Home Layout: One level        Prior Function            PT Goals (current goals can now be found in the  care plan section) Acute Rehab PT Goals Patient Stated Goal: to feel better PT Goal Formulation: With patient Time For Goal Achievement: 06/13/22 Potential to Achieve Goals: Good Progress towards PT goals: Progressing toward goals    Frequency    Min 3X/week      PT Plan Current plan remains appropriate     Co-evaluation   Reason for Co-Treatment: To address functional/ADL transfers PT goals addressed during session: Mobility/safety with mobility;Balance OT goals addressed during session: ADL's and self-care      AM-PAC PT "6 Clicks" Mobility   Outcome Measure  Help needed turning from your back to your side while in a flat bed without using bedrails?: A Little Help needed moving from lying on your back to sitting on the side of a flat bed without using bedrails?: A Lot Help needed moving to and from a bed to a chair (including a wheelchair)?: Total Help needed standing up from a chair using your arms (e.g., wheelchair or bedside chair)?: Total Help needed to walk in hospital room?: Total Help needed climbing 3-5 steps with a railing? : Total 6 Click Score: 9    End of Session   Activity Tolerance: Patient limited by fatigue;Treatment limited secondary to medical complications (Comment) (BP) Patient left: in bed;with call bell/phone within reach;with bed alarm set Nurse Communication: Mobility status;Other (comment) (BP) PT Visit Diagnosis: Hemiplegia and hemiparesis;Difficulty in walking, not elsewhere classified (R26.2) Hemiplegia - Right/Left: Left Hemiplegia - dominant/non-dominant: Non-dominant     Time: 8144-8185 PT Time Calculation (min) (ACUTE ONLY): 34 min  Charges:  $Therapeutic Activity: 8-22 mins                      Tori Irmalee Riemenschneider PT, DPT 06/01/22, 10:20 AM

## 2022-06-01 NOTE — Progress Notes (Signed)
Pt triggered yellow MEWS protocol r/t HTN which has been pt's baseline. No acute distress noted. Will continue to monitor.    06/01/22 1322  Assess: MEWS Score  Temp (!) 97.5 F (36.4 C)  BP (!) 201/93  MAP (mmHg) 125  Pulse Rate 79  Resp 18  SpO2 98 %  O2 Device Room Air  Assess: MEWS Score  MEWS Temp 0  MEWS Systolic 2  MEWS Pulse 0  MEWS RR 0  MEWS LOC 0  MEWS Score 2  MEWS Score Color Yellow  Assess: SIRS CRITERIA  SIRS Temperature  0  SIRS Pulse 0  SIRS Respirations  0  SIRS WBC 0  SIRS Score Sum  0   MEWS Guidelines - (patients age 15 and over)

## 2022-06-01 NOTE — Progress Notes (Signed)
PROGRESS NOTE  [name] HDQ:[id number] DOB: 02-23-1961   PCP: [name], [name], MD  Patient is from: Home.  Independently ambulates at baseline.  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  She has new right hemiparesis as a result of CVA.  She is already on DAPT with Plavix and Pletal.  LDL elevated to 120 despite Lipitor 80 mg daily and Zetia 10 mg daily.  A1c elevated to 11.4.  Neurology recommended continuing DAPT and referral to lipid clinic.  Therapy recommended CIR.  CIR following.  Subjective: Seen and examined earlier this morning.  No major events overnight of this morning.  Endorses dizziness when she tries to get up.  Dizziness is not necessarily spinning.  Also feels some fullness in the left face and neck.  Denies pain.  Objective: Vitals:   05/31/22 1353 05/31/22 2142 06/01/22 0211 06/01/22 0638  BP: (!) 164/89 (!) 170/74 (!) 179/87 (!) 192/87  Pulse: 85 60 68 73  Resp: '18 14 14 14  '$ Temp: 97.6 F (36.4 C) 98.7 F (37.1 C) 98.2 F (36.8 C) 97.9 F (36.6 C)  TempSrc: Oral Oral Oral Oral  SpO2: 95% 98% 98% 98%  Weight:      Height:        Examination:  GENERAL: No apparent distress.  Nontoxic. HEENT: MMM.  Vision and hearing grossly intact.  Slight fullness in the left face and neck.  No focal tenderness or erythema. NECK: Supple.  No apparent JVD.  RESP:  No IWOB.  Fair aeration bilaterally. CVS:  RRR. Heart sounds normal.  ABD/GI/GU: BS+. Abd soft, NTND.  MSK/EXT:  Moves extremities. No apparent deformity. No edema.  SKIN: no apparent skin  lesion or wound NEURO: Awake, alert and oriented appropriately.  Left hemiparesis.  PSYCH: Calm. Normal affect.   Procedures:  None  Microbiology summarized: [id number], influenza and RSV PCR nonreactive.  Assessment and plan: Principal Problem:   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HCC) Active Problems:   Hyperlipidemia associated with type 2 diabetes mellitus ([place])   Essential hypertension   DM (diabetes mellitus), type 2 with peripheral vascular complications (HCC)   Dizziness   Personal history of CLL (chronic lymphocytic leukemia)   Abdominal pain   AKI (acute kidney injury) ([place])  Acute stroke of right middle lobe lung data: Presents with dizziness , generalized numbness and unsteady gait.  Seems to have acute left hemiparesis on exam.  MRI/MRA showed acute ischemic stroke of right medulla with multivessel disease.  She is on Plavix, Pletal, Lipitor 80 mg and Zetia at home.  LDL elevated to 123.  A1c elevated to 11.4%.  TTE without significant finding.  Suspect noncompliance with medications. -Neurology recommended continuing DAPT, statin, Zetia and referral to lipid clinic -I think that is reasonable to start resuming antihypertensive meds gradually -Therapy recommended CIR.   AKI on CKD stage IIIa: Baseline Cr ~1.2-1.4.  Received some contrast with CT angio on admission.  No significant renal artery stenosis on CT angio. Recent Labs    09/06/21 0814 09/15/21 1802 09/17/21 0340 10/01/21 1050 01/20/22 4818 03/11/22 1248 05/29/22 1145 05/31/22 0606 06/01/22 5631  BUN 30* 27* 30* 36* '22 21 22 '$ 24* 26*  CREATININE 1.21* 1.13* 1.29* 1.32* 1.13* 1.47* 1.28* 1.66* 1.81*  -Continue holding lisinopril/HCTZ. -May consider getting renal US if worse.   Abdominal pain: Seems to have resolved.  CT angio shows bulky bilateral axillary, mesenteric and retroperitoneal adenopathy consistent with history of CLL.   History of CLL: Follows with Dr. Irene Limbo.  CT imaging showed bulky  adenopathy of the axillary, central and retroperitoneal region. -Recommend outpatient follow-up with oncology.   Uncontrolled IDDM-2 with hyperglycemia and other complications:  V2Z 36.6%.suspect noncompliance with medications.  Recent Labs  Lab 05/31/22 1146 05/31/22 1620 05/31/22 2145 06/01/22 0800 06/01/22 1126  GLUCAP 305* 112* 200* 188* 173*  -Increase Semglee from 15 units daily to 15 units twice daily -Continue SSI-moderate -Continue NovoLog 4 units 3 times daily with meals  Uncontrolled hypertension: BP elevated to 190s/80s in the setting of permissive hypertension. -Resume home metoprolol -Continue holding lisinopril, HCTZ and amlodipine.   Hyperlipidemia: LDL 123 despite maximum statin and Zetia.  Suspect noncompliance. -Continue Lipitor and Zetia -Referral to lipid clinic on discharge   Obesity: Body mass index is 30.73 kg/m.  prophylaxis:  enoxaparin (LOVENOX) injection 40 mg Start: 05/30/22 1000  Code Status: Full code Family Communication: None at bedside Level of care: Telemetry Status is: Inpatient Remains inpatient appropriate because: AKI, safe disposition/CIR   Final disposition: CIR Consultants:  Neurology  55 minutes with more than 50% spent in reviewing records, counseling patient/family and coordinating care.   Sch Meds:  Scheduled Meds:  atorvastatin  80 mg Oral Daily   cilostazol  50 mg Oral BID   clopidogrel  75 mg Oral Daily   enoxaparin (LOVENOX) injection  40 mg Subcutaneous Q24H   ezetimibe  10 mg Oral Daily   FLUoxetine  20 mg Oral Daily   insulin aspart  0-15 Units Subcutaneous TID PC & HS   insulin aspart  4 Units Subcutaneous TID WC   insulin glargine-yfgn  15 Units Subcutaneous BID   metoprolol succinate  50 mg Oral Daily   pantoprazole  40 mg Oral Daily   Continuous Infusions:  sodium chloride 75 mL/hr at 06/01/22 0758   PRN Meds:.acetaminophen **OR** acetaminophen (TYLENOL) oral liquid 160 mg/5 mL **OR** acetaminophen,  ALPRAZolam, hydrOXYzine, labetalol, mouth rinse  Antimicrobials: Anti-infectives (From admission, onward)    Start     Dose/Rate Route Frequency Ordered Stop   05/29/22 1715  cefTRIAXone (ROCEPHIN) 2 g in sodium chloride 0.9 % 100 mL IVPB        2 g 200 mL/hr over 30 Minutes Intravenous  Once 05/29/22 1712 05/29/22 1859        I have personally reviewed the following labs and images: CBC: Recent Labs  Lab 05/29/22 1145  WBC 5.9  HGB 12.1  HCT 36.8  MCV 76.8*  PLT 266   BMP &GFR Recent Labs  Lab 05/29/22 1145 05/31/22 0606 06/01/22 0629  NA 136 137 140  K 3.7 3.7 4.1  CL 103 106 109  CO2 '25 22 23  '$ GLUCOSE 196* 203* 183*  BUN 22 24* 26*  CREATININE 1.28* 1.66* 1.81*  CALCIUM 9.6 9.4 9.1  MG 2.1  --   --    Estimated Creatinine Clearance: 32.8 mL/min (A) (by C-G formula based on SCr of 1.81 mg/dL (H)). Liver & Pancreas: No results for input(s): "AST", "ALT", "ALKPHOS", "BILITOT", "PROT", "ALBUMIN" in the last 168 hours. No results for input(s): "LIPASE", "AMYLASE" in the last 168 hours. No results  for input(s): "AMMONIA" in the last 168 hours. Diabetic: Recent Labs    05/30/22 0541  HGBA1C 11.4*   Recent Labs  Lab 05/31/22 1146 05/31/22 1620 05/31/22 2145 06/01/22 0800 06/01/22 1126  GLUCAP 305* 112* 200* 188* 173*   Cardiac Enzymes: No results for input(s): "CKTOTAL", "CKMB", "CKMBINDEX", "TROPONINI" in the last 168 hours. No results for input(s): "PROBNP" in the last 8760 hours. Coagulation Profile: No results for input(s): "INR", "PROTIME" in the last 168 hours. Thyroid Function Tests: No results for input(s): "TSH", "T4TOTAL", "FREET4", "T3FREE", "THYROIDAB" in the last 72 hours. Lipid Profile: Recent Labs    05/30/22 0541  CHOL 181  HDL 37*  LDLCALC 123*  TRIG 103  CHOLHDL 4.9   Anemia Panel: No results for input(s): "VITAMINB12", "FOLATE", "FERRITIN", "TIBC", "IRON", "RETICCTPCT" in the last 72 hours. Urine analysis:    Component  Value Date/Time   COLORURINE YELLOW 05/29/2022 [place] 05/29/2022 1621   LABSPEC 1.015 05/29/2022 1621   PHURINE 6.5 05/29/2022 1621   GLUCOSEU 250 (A) 05/29/2022 1621   HGBUR MODERATE (A) 05/29/2022 1621   HGBUR negative 03/12/2010 0847   BILIRUBINUR NEGATIVE 05/29/2022 1621   BILIRUBINUR n 09/11/2015 1039   KETONESUR NEGATIVE 05/29/2022 1621   PROTEINUR >=300 (A) 05/29/2022 1621   UROBILINOGEN 1.0 09/11/2015 1039   UROBILINOGEN 1.0 05/19/2014 1858   NITRITE NEGATIVE 05/29/2022 1621   LEUKOCYTESUR NEGATIVE 05/29/2022 1621   Sepsis Labs: Invalid input(s): "PROCALCITONIN", "LACTICIDVEN"  Microbiology: Recent Results (from the past 240 hour(s))  Resp panel by RT-PCR (RSV, Flu A&B, Covid) Nasopharyngeal Swab     Status: None   Collection Time: 05/29/22 11:21 AM   Specimen: Nasopharyngeal Swab; Nasal Swab  Result Value Ref Range Status   SARS Coronavirus 2 by RT PCR NEGATIVE NEGATIVE Final    Comment: (NOTE) SARS-CoV-2 target nucleic acids are NOT DETECTED.  The SARS-CoV-2 RNA is generally detectable in upper respiratory specimens during the acute phase of infection. The lowest concentration of SARS-CoV-2 viral copies this assay can detect is 138 copies/mL. A negative result does not preclude SARS-Cov-2 infection and should not be used as the sole basis for treatment or other patient management decisions. A negative result may occur with  improper specimen collection/handling, submission of specimen other than nasopharyngeal swab, presence of viral mutation(s) within the areas targeted by this assay, and inadequate number of viral copies(<138 copies/mL). A negative result must be combined with clinical observations, patient history, and epidemiological information. The expected result is Negative.  Fact Sheet for Patients:  EntrepreneurPulse.com.au  Fact Sheet for Healthcare Providers:  IncredibleEmployment.be  This  test is no t yet approved or cleared by the [place] FDA and  has been authorized for detection and/or diagnosis of SARS-CoV-2 by FDA under an Emergency Use Authorization (EUA). This EUA will remain  in effect (meaning this test can be used) for the duration of the COVID-19 declaration under Section 564(b)(1) of the Act, 21 U.S.C.section 360bbb-3(b)(1), unless the authorization is terminated  or revoked sooner.       Influenza A by PCR NEGATIVE NEGATIVE Final   Influenza B by PCR NEGATIVE NEGATIVE Final    Comment: (NOTE) The Xpert Xpress SARS-CoV-2/FLU/RSV plus assay is intended as an aid in the diagnosis of influenza from Nasopharyngeal swab specimens and should not be used as a sole basis for treatment. Nasal washings and aspirates are unacceptable for Xpert Xpress SARS-CoV-2/FLU/RSV testing.  Fact Sheet for Patients: EntrepreneurPulse.com.au  Fact Sheet for Healthcare Providers: IncredibleEmployment.be  This test is not yet approved or cleared by the [place] and has been authorized for detection and/or diagnosis of SARS-CoV-2 by FDA under an Emergency Use Authorization (EUA). This EUA will remain in effect (meaning this test can be used) for the duration of the COVID-19 declaration under Section 564(b)(1) of the Act, 21 U.S.C. section 360bbb-3(b)(1), unless the authorization is terminated or revoked.     Resp Syncytial Virus by PCR NEGATIVE NEGATIVE Final    Comment: (NOTE) Fact Sheet for Patients: EntrepreneurPulse.com.au  Fact Sheet for Healthcare Providers: IncredibleEmployment.be  This test is not yet approved or cleared by the [place] FDA and has been authorized for detection and/or diagnosis of SARS-CoV-2 by FDA under an Emergency Use Authorization (EUA). This EUA will remain in effect (meaning this test can be used) for the duration of the COVID-19 declaration under  Section 564(b)(1) of the Act, 21 U.S.C. section 360bbb-3(b)(1), unless the authorization is terminated or revoked.  Performed at [facility], [address], [place], [place] [address]     Radiology Studies: No results found.    Zaniel Marineau T. Lasker  If 7PM-7AM, please contact night-coverage [email or web address] 06/01/2022, 1:08 PM

## 2022-06-02 ENCOUNTER — Encounter: Payer: Commercial Managed Care - HMO | Admitting: Neurology

## 2022-06-02 DIAGNOSIS — E1151 Type 2 diabetes mellitus with diabetic peripheral angiopathy without gangrene: Secondary | ICD-10-CM | POA: Diagnosis not present

## 2022-06-02 DIAGNOSIS — N179 Acute kidney failure, unspecified: Secondary | ICD-10-CM | POA: Diagnosis not present

## 2022-06-02 DIAGNOSIS — I639 Cerebral infarction, unspecified: Secondary | ICD-10-CM | POA: Diagnosis not present

## 2022-06-02 DIAGNOSIS — R42 Dizziness and giddiness: Secondary | ICD-10-CM | POA: Diagnosis not present

## 2022-06-02 LAB — RENAL FUNCTION PANEL
Albumin: 2.8 g/dL — ABNORMAL LOW (ref 3.5–5.0)
Anion gap: 6 (ref 5–15)
BUN: 23 mg/dL (ref 8–23)
CO2: 22 mmol/L (ref 22–32)
Calcium: 9.5 mg/dL (ref 8.9–10.3)
Chloride: 112 mmol/L — ABNORMAL HIGH (ref 98–111)
Creatinine, Ser: 1.32 mg/dL — ABNORMAL HIGH (ref 0.44–1.00)
GFR, Estimated: 45 mL/min — ABNORMAL LOW (ref >60)
Glucose, Bld: 114 mg/dL — ABNORMAL HIGH (ref 70–99)
Phosphorus: 3.9 mg/dL (ref 2.5–4.6)
Potassium: 3.9 mmol/L (ref 3.5–5.1)
Sodium: 140 mmol/L (ref 135–145)

## 2022-06-02 LAB — CBC
HCT: 35.8 % — ABNORMAL LOW (ref 36.0–46.0)
Hemoglobin: 11.2 g/dL — ABNORMAL LOW (ref 12.0–15.0)
MCH: 25.1 pg — ABNORMAL LOW (ref 26.0–34.0)
MCHC: 31.3 g/dL (ref 30.0–36.0)
MCV: 80.3 fL (ref 80.0–100.0)
Platelets: 239 10*3/uL (ref 150–400)
RBC: 4.46 MIL/uL (ref 3.87–5.11)
RDW: 15.4 % (ref 11.5–15.5)
WBC: 6.5 10*3/uL (ref 4.0–10.5)
nRBC: 0 % (ref 0.0–0.2)

## 2022-06-02 LAB — GLUCOSE, CAPILLARY
Glucose-Capillary: 104 mg/dL — ABNORMAL HIGH (ref 70–99)
Glucose-Capillary: 184 mg/dL — ABNORMAL HIGH (ref 70–99)
Glucose-Capillary: 216 mg/dL — ABNORMAL HIGH (ref 70–99)
Glucose-Capillary: 202 mg/dL — ABNORMAL HIGH (ref 70–99)
Glucose-Capillary: 131 mg/dL — ABNORMAL HIGH (ref 70–99)

## 2022-06-02 LAB — MAGNESIUM: Magnesium: 2.1 mg/dL (ref 1.7–2.4)

## 2022-06-02 MED ORDER — HYDRALAZINE HCL 25 MG PO TABS
25.0000 mg | ORAL_TABLET | Freq: Three times a day (TID) | ORAL | Status: DC
  Administered 2022-06-02 – 2022-06-03 (×4): 25 mg via ORAL
  Filled 2022-06-02 (×4): qty 1

## 2022-06-02 MED ORDER — METOPROLOL SUCCINATE ER 25 MG PO TB24
25.0000 mg | ORAL_TABLET | Freq: Every day | ORAL | Status: DC
  Administered 2022-06-03: 25 mg via ORAL
  Filled 2022-06-02: qty 1

## 2022-06-02 NOTE — Progress Notes (Signed)
PROGRESS NOTE  [name] OAC:[id number] DOB: 1961-03-07   PCP: [name], [name], MD  Patient is from: Home.  Independently ambulates at baseline.  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  She has new right hemiparesis as a result of CVA.  She is already on DAPT with Plavix and Pletal.  LDL elevated to 120 despite Lipitor 80 mg daily and Zetia 10 mg daily.  A1c elevated to 11.4.  Neurology recommended continuing DAPT and referral to lipid clinic.  Therapy recommended CIR.  CIR following.  Patient is medically stable for discharge  Subjective: Seen and examined earlier this morning.  No major events overnight of this morning.  Still with "dizziness".  Reports improvement with meclizine.  Objective: Vitals:   06/02/22 0633 06/02/22 0833 06/02/22 0942 06/02/22 1300  BP: (!) 178/79 (!) 177/82 (!) 197/95 (!) 152/76  Pulse: (!) 53 (!) 55 (!) 58 68  Resp: 14  18   Temp: 97.7 F (36.5 C)  98.3 F (36.8 C)   TempSrc: Oral  Oral   SpO2: 93%  98%   Weight:      Height:        Examination:  GENERAL: No apparent distress.  Nontoxic. HEENT: MMM.  Vision and hearing grossly intact.  NECK: Supple.  No apparent JVD.  RESP:  No IWOB.  Fair aeration bilaterally. CVS:  RRR. Heart sounds normal.  ABD/GI/GU: BS+. Abd soft, NTND.  MSK/EXT:  Moves extremities.  Left hemiparesis. SKIN: no apparent skin lesion or wound NEURO: Awake and alert. Oriented appropriately.  Motor 3/5 on the left side. PSYCH: Calm. Normal affect.   Procedures:  None  Microbiology  summarized: [id number], influenza and RSV PCR nonreactive.  Assessment and plan: Principal Problem:   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HCC) Active Problems:   Hyperlipidemia associated with type 2 diabetes mellitus ([place])   Essential hypertension   DM (diabetes mellitus), type 2 with peripheral vascular complications (HCC)   Dizziness   Personal history of CLL (chronic lymphocytic leukemia)   Abdominal pain   AKI (acute kidney injury) ([place])  Acute stroke of right middle lobe lung data: Presents with dizziness , generalized numbness and unsteady gait.  Seems to have acute left hemiparesis on exam.  MRI/MRA showed acute ischemic stroke of right medulla with multivessel disease.  She is on Plavix, Pletal, Lipitor 80 mg and Zetia at home.  LDL elevated to 123.  A1c elevated to 11.4%.  TTE without significant finding.  Suspect noncompliance with medications. -Neurology recommended continuing DAPT, statin, Zetia and referral to lipid clinic -Resuming home antihypertensive meds -Therapy recommended CIR.   AKI on CKD stage IIIa: Baseline Cr ~1.2-1.4.  Due to IV contrast?  Resolved. Recent Labs    09/06/21 0953 09/15/21 1802 09/17/21 0340 10/01/21 1050 01/20/22 0954 03/11/22 1248 05/29/22 1145 05/31/22 0606 06/01/22 0629 06/02/22 0555  BUN 30* 27* 30* 36* '22 21 22 '$ 24* 26* 23  CREATININE 1.21* 1.13* 1.29* 1.32* 1.13* 1.47* 1.28* 1.66* 1.81* 1.32*  -Continue holding lisinopril/HCTZ.   Abdominal pain: Seems to have resolved.  CT angio shows bulky bilateral axillary, mesenteric and retroperitoneal  adenopathy consistent with history of CLL.   History of CLL: Follows with Dr. Irene Limbo.  CT imaging showed bulky adenopathy of the axillary, central and retroperitoneal region. -Recommend outpatient follow-up with oncology.   Uncontrolled IDDM-2 with hyperglycemia and other complications:  Y7W 29.5%.suspect noncompliance with medications.  Recent Labs  Lab 06/01/22 1619 06/01/22 2130  06/02/22 0740 06/02/22 1201 06/02/22 1307  GLUCAP 159* 95 104* 184* 216*  -Increased Semglee from 15 units daily to 15 units twice daily on 1/31. -Continue SSI-moderate  Uncontrolled hypertension: BP elevated but improved. -His Toprol-XL to 25 mg daily due to bradycardia -Continue home amlodipine. -Continue holding lisinopril and HCT   Hyperlipidemia: LDL 123 despite maximum statin and Zetia.  Suspect noncompliance. -Continue Lipitor and Zetia -Referral to lipid clinic on discharge   Obesity: Body mass index is 30.73 kg/m.  prophylaxis:  enoxaparin (LOVENOX) injection 40 mg Start: 05/30/22 1000  Code Status: Full code Family Communication: None at bedside Level of care: Telemetry Status is: Inpatient Remains inpatient appropriate because: Safe disposition/CIR   Final disposition: CIR Consultants:  Neurology  35 minutes with more than 50% spent in reviewing records, counseling patient/family and coordinating care.   Sch Meds:  Scheduled Meds:  amLODipine  10 mg Oral Daily   atorvastatin  80 mg Oral Daily   cilostazol  50 mg Oral BID   clopidogrel  75 mg Oral Daily   enoxaparin (LOVENOX) injection  40 mg Subcutaneous Q24H   ezetimibe  10 mg Oral Daily   FLUoxetine  20 mg Oral Daily   hydrALAZINE  25 mg Oral Q8H   insulin aspart  0-15 Units Subcutaneous TID PC & HS   insulin glargine-yfgn  15 Units Subcutaneous BID   [START ON 06/03/2022] metoprolol succinate  25 mg Oral Daily   pantoprazole  40 mg Oral Daily   Continuous Infusions:   PRN Meds:.acetaminophen **OR** acetaminophen (TYLENOL) oral liquid 160 mg/5 mL **OR** acetaminophen, ALPRAZolam, hydrOXYzine, labetalol, ondansetron (ZOFRAN) IV, mouth rinse  Antimicrobials: Anti-infectives (From admission, onward)    Start     Dose/Rate Route Frequency Ordered Stop   05/29/22 1715  cefTRIAXone (ROCEPHIN) 2 g in sodium chloride 0.9 % 100 mL IVPB        2 g 200 mL/hr over 30 Minutes Intravenous  Once 05/29/22  1712 05/29/22 1859        I have personally reviewed the following labs and images: CBC: Recent Labs  Lab 05/29/22 1145 06/02/22 0555  WBC 5.9 6.5  HGB 12.1 11.2*  HCT 36.8 35.8*  MCV 76.8* 80.3  PLT 266 239   BMP &GFR Recent Labs  Lab 05/29/22 1145 05/31/22 0606 06/01/22 0629 06/02/22 0555  NA 136 137 140 140  K 3.7 3.7 4.1 3.9  CL 103 106 109 112*  CO2 '25 22 23 22  '$ GLUCOSE 196* 203* 183* 114*  BUN 22 24* 26* 23  CREATININE 1.28* 1.66* 1.81* 1.32*  CALCIUM 9.6 9.4 9.1 9.5  MG 2.1  --   --  2.1  PHOS  --   --   --  3.9   Estimated Creatinine Clearance: 45 mL/min (A) (by C-G formula based on SCr of 1.32 mg/dL (H)). Liver & Pancreas: Recent Labs  Lab 06/02/22 0555  ALBUMIN 2.8*   No results for input(s): "LIPASE", "AMYLASE" in the last 168 hours. No results for input(s): "AMMONIA" in the last 168 hours. Diabetic: No results for input(s): "HGBA1C" in the last 72 hours.  Recent Labs  Lab 06/01/22 1619  06/01/22 2130 06/02/22 0740 06/02/22 1201 06/02/22 1307  GLUCAP 159* 95 104* 184* 216*   Cardiac Enzymes: No results for input(s): "CKTOTAL", "CKMB", "CKMBINDEX", "TROPONINI" in the last 168 hours. No results for input(s): "PROBNP" in the last 8760 hours. Coagulation Profile: No results for input(s): "INR", "PROTIME" in the last 168 hours. Thyroid Function Tests: No results for input(s): "TSH", "T4TOTAL", "FREET4", "T3FREE", "THYROIDAB" in the last 72 hours. Lipid Profile: No results for input(s): "CHOL", "HDL", "LDLCALC", "TRIG", "CHOLHDL", "LDLDIRECT" in the last 72 hours.  Anemia Panel: No results for input(s): "VITAMINB12", "FOLATE", "FERRITIN", "TIBC", "IRON", "RETICCTPCT" in the last 72 hours. Urine analysis:    Component Value Date/Time   COLORURINE YELLOW 05/29/2022 [place] 05/29/2022 1621   LABSPEC 1.015 05/29/2022 1621   PHURINE 6.5 05/29/2022 1621   GLUCOSEU 250 (A) 05/29/2022 1621   HGBUR MODERATE (A) 05/29/2022 1621    HGBUR negative 03/12/2010 0847   BILIRUBINUR NEGATIVE 05/29/2022 1621   BILIRUBINUR n 09/11/2015 1039   KETONESUR NEGATIVE 05/29/2022 1621   PROTEINUR >=300 (A) 05/29/2022 1621   UROBILINOGEN 1.0 09/11/2015 1039   UROBILINOGEN 1.0 05/19/2014 1858   NITRITE NEGATIVE 05/29/2022 1621   LEUKOCYTESUR NEGATIVE 05/29/2022 1621   Sepsis Labs: Invalid input(s): "PROCALCITONIN", "LACTICIDVEN"  Microbiology: Recent Results (from the past 240 hour(s))  Resp panel by RT-PCR (RSV, Flu A&B, Covid) Nasopharyngeal Swab     Status: None   Collection Time: 05/29/22 11:21 AM   Specimen: Nasopharyngeal Swab; Nasal Swab  Result Value Ref Range Status   SARS Coronavirus 2 by RT PCR NEGATIVE NEGATIVE Final    Comment: (NOTE) SARS-CoV-2 target nucleic acids are NOT DETECTED.  The SARS-CoV-2 RNA is generally detectable in upper respiratory specimens during the acute phase of infection. The lowest concentration of SARS-CoV-2 viral copies this assay can detect is 138 copies/mL. A negative result does not preclude SARS-Cov-2 infection and should not be used as the sole basis for treatment or other patient management decisions. A negative result may occur with  improper specimen collection/handling, submission of specimen other than nasopharyngeal swab, presence of viral mutation(s) within the areas targeted by this assay, and inadequate number of viral copies(<138 copies/mL). A negative result must be combined with clinical observations, patient history, and epidemiological information. The expected result is Negative.  Fact Sheet for Patients:  EntrepreneurPulse.com.au  Fact Sheet for Healthcare Providers:  IncredibleEmployment.be  This test is no t yet approved or cleared by the [place] FDA and  has been authorized for detection and/or diagnosis of SARS-CoV-2 by FDA under an Emergency Use Authorization (EUA). This EUA will remain  in effect (meaning this  test can be used) for the duration of the COVID-19 declaration under Section 564(b)(1) of the Act, 21 U.S.C.section 360bbb-3(b)(1), unless the authorization is terminated  or revoked sooner.       Influenza A by PCR NEGATIVE NEGATIVE Final   Influenza B by PCR NEGATIVE NEGATIVE Final    Comment: (NOTE) The Xpert Xpress SARS-CoV-2/FLU/RSV plus assay is intended as an aid in the diagnosis of influenza from Nasopharyngeal swab specimens and should not be used as a sole basis for treatment. Nasal washings and aspirates are unacceptable for Xpert Xpress SARS-CoV-2/FLU/RSV testing.  Fact Sheet for Patients: EntrepreneurPulse.com.au  Fact Sheet for Healthcare Providers: IncredibleEmployment.be  This test is not yet approved or cleared by the [place] FDA and has been authorized for detection and/or diagnosis of SARS-CoV-2 by FDA under an Emergency Use Authorization (EUA). This EUA will  remain in effect (meaning this test can be used) for the duration of the COVID-19 declaration under Section 564(b)(1) of the Act, 21 U.S.C. section 360bbb-3(b)(1), unless the authorization is terminated or revoked.     Resp Syncytial Virus by PCR NEGATIVE NEGATIVE Final    Comment: (NOTE) Fact Sheet for Patients: EntrepreneurPulse.com.au  Fact Sheet for Healthcare Providers: IncredibleEmployment.be  This test is not yet approved or cleared by the [place] FDA and has been authorized for detection and/or diagnosis of SARS-CoV-2 by FDA under an Emergency Use Authorization (EUA). This EUA will remain in effect (meaning this test can be used) for the duration of the COVID-19 declaration under Section 564(b)(1) of the Act, 21 U.S.C. section 360bbb-3(b)(1), unless the authorization is terminated or revoked.  Performed at [facility], [address], [place], [place] [address]     Radiology Studies: No  results found.    [name] T. Marshall  If 7PM-7AM, please contact night-coverage [email or web address] 06/02/2022, 1:59 PM

## 2022-06-02 NOTE — Progress Notes (Signed)
Occupational Therapy Treatment Patient Details Name: [name] MRN: [id number] DOB: 1960-12-12 Today's Date: 06/02/2022   History of present illness Patient is a 62 year old female who presented with BLE weakness and dizziness. Patient was admitted with dizziness, abdominal pain. patients MRI results showed "acute ischemic stroke on the right aspect of medulla, multivessel disease". on 1/29 noted increased weakness in LUE and LLE than earlier in day.  PMH: CLL, DM II, HTN, hyperlipidemia, CVA,guillain-Barre,diplopia,   OT comments  Treatment focused on education in regards to brain retraining, positioning and need to not learn "bad movements" during recovery with neuro-muscular re-education techniques to LUE in supine and in sitting - including initiating AROM, muscle tapping, and weight bearing, She presents with abnormal muscle recruitment with attempt to actively move left arm - predominantly in elbow flexor and internal rotators. Patient educated on the need to position arm in external rotation in supine daily. Patient reports dizziness with movement but it settles within one minute. She was mod x 2 to stand, mod x 2 to pivot to recliner. She required one person to assist with standing balance and the other to block buckling hemiparetic LLE. She was total assist for toileting but able to take a couple of steps with hemiwalker and mod x 2 -- once again therapist blocking and unblocking knee and cueing patient for step sequencing. Patient tolerated therapy well and tried very hard. Therapist continues to recommend AIR level of therapy at discharge.    Recommendations for follow up therapy are one component of a multi-disciplinary discharge planning process, led by the attending physician.  Recommendations may be updated based on patient status, additional functional criteria and insurance authorization.    Follow Up Recommendations  Acute inpatient rehab (3hours/day)     Assistance Recommended  at Discharge Frequent or constant Supervision/Assistance  Patient can return home with the following  Two people to help with walking and/or transfers;A lot of help with bathing/dressing/bathroom;Assistance with cooking/housework;Direct supervision/assist for medications management;Assist for transportation;Help with stairs or ramp for entrance;Direct supervision/assist for financial management   Equipment Recommendations  Other (comment) (Defer)    Recommendations for Other Services      Precautions / Restrictions Precautions Precautions: Fall Precaution Comments: Left Hemiparesis, Left knee buckles, support left arm in standing Restrictions Weight Bearing Restrictions: No       Mobility Bed Mobility Overal bed mobility: Needs Assistance Bed Mobility: Supine to Sit     Supine to sit: Mod assist, HOB elevated          Transfers Overall transfer level: Needs assistance Equipment used: Hemi-walker Transfers: Sit to/from Stand, Bed to chair/wheelchair/BSC Sit to Stand: Mod assist, +2 physical assistance     Step pivot transfers: Mod assist, +2 physical assistance     General transfer comment: +2 to stand - needing balance assistance and assist to block left knee to keep from buckling.     Balance Overall balance assessment: Needs assistance Sitting-balance support: No upper extremity supported, Feet supported Sitting balance-Leahy Scale: Fair Sitting balance - Comments: dizzy with movement - but settels   Standing balance support: Single extremity supported Standing balance-Leahy Scale: Poor                             ADL either performed or assessed with clinical judgement   ADL Overall ADL's : Needs assistance/impaired  Toilet Transfer: Moderate assistance;+2 for physical assistance;BSC/3in1 Toilet Transfer Details (indicate cue type and reason): Mod x 2 to pivot to recliner. one person to assist with her  maintaining stand position and good hold with RUE and the other blocking knee to reduce buckle. Toileting- Clothing Manipulation and Hygiene: Total assistance;Sit to/from stand;+2 for physical assistance Toileting - Clothing Manipulation Details (indicate cue type and reason): +2 physical assist to stand and provide perianal care. Left knee had to be stabilized with standing     Functional mobility during ADLs: Moderate assistance;+2 for physical assistance      Extremity/Trunk Assessment              Vision   Vision Assessment?: No apparent visual deficits   Perception     Praxis      Cognition Arousal/Alertness: Awake/alert Behavior During Therapy: WFL for tasks assessed/performed Overall Cognitive Status: Within Functional Limits for tasks assessed                                 General Comments: alert and oriented        Exercises Other Exercises Other Exercises: Neuro Re--ed to Left upper extremity in supine and in sitting at edge of bed    Shoulder Instructions       General Comments      Pertinent Vitals/ Pain       Pain Assessment Pain Assessment: No/denies pain  Home Living                                          Prior Functioning/Environment              Frequency  Min 2X/week        Progress Toward Goals  OT Goals(current goals can now be found in the care plan section)  Progress towards OT goals: Progressing toward goals  Acute Rehab OT Goals Patient Stated Goal: to use left arm OT Goal Formulation: With patient Time For Goal Achievement: 06/13/22 Potential to Achieve Goals: [place] Discharge plan remains appropriate    Co-evaluation          OT goals addressed during session: ADL's and self-care;Strengthening/ROM      AM-PAC OT "6 Clicks" Daily Activity     Outcome Measure   Help from another person eating meals?: A Little Help from another person taking care of personal  grooming?: A Little Help from another person toileting, which includes using toliet, bedpan, or urinal?: Total Help from another person bathing (including washing, rinsing, drying)?: A Lot Help from another person to put on and taking off regular upper body clothing?: A Lot Help from another person to put on and taking off regular lower body clothing?: Total 6 Click Score: 12    End of Session Equipment Utilized During Treatment: Gait belt  OT Visit Diagnosis: Unsteadiness on feet (R26.81);Other abnormalities of gait and mobility (R26.89);Muscle weakness (generalized) (M62.81)   Activity Tolerance Patient tolerated treatment well   Patient Left in bed;with chair alarm set;with call bell/phone within reach   Nurse Communication Mobility status        Time: 1610-9604 OT Time Calculation (min): 38 min  Charges: OT General Charges $OT Visit: 1 Visit OT Treatments $Self Care/Home Management : 8-22 mins $Neuromuscular Re-education: 23-37 mins  Gustavo Lah, OTR/L Acute Care Rehab  Services  Office [phone]   Lenward Chancellor 06/02/2022, 12:27 PM

## 2022-06-02 NOTE — Progress Notes (Signed)
Physical Therapy Treatment Patient Details Name: [name] MRN: [id number] DOB: [birth year] Today's Date: 06/02/2022   History of Present Illness Patient is a 62 year old female who presented with BLE weakness and dizziness. Patient was admitted with dizziness, abdominal pain. patients MRI results showed "acute ischemic stroke on the right aspect of medulla, multivessel disease". on 1/29 noted increased weakness in LUE and LLE than earlier in day.  PMH: CLL, DM II, HTN, hyperlipidemia, CVA,guillain-Barre,diplopia,    PT Comments    Pt tolerated standing for ~5 minutes with hemiwalker on R and supportive assistance at LUE and for blocking L knee.  Pt performed weight shifting in normal stance and with LLE advanced. Pt performed seated LUE/LE ROM exercises. No active L ankle dorsiflexion, L knee extension -2/5, L hip flexion -2/5.   Recommendations for follow up therapy are one component of a multi-disciplinary discharge planning process, led by the attending physician.  Recommendations may be updated based on patient status, additional functional criteria and insurance authorization.  Follow Up Recommendations  Acute inpatient rehab (3hours/day)     Assistance Recommended at Discharge Intermittent Supervision/Assistance  Patient can return home with the following Assist for transportation;Help with stairs or ramp for entrance;A lot of help with walking and/or transfers;A lot of help with bathing/dressing/bathroom;Assistance with cooking/housework   Equipment Recommendations  None recommended by PT    Recommendations for Other Services       Precautions / Restrictions Precautions Precautions: Fall Precaution Comments: Left Hemiparesis, Left knee buckles, support left arm in standing Restrictions Weight Bearing Restrictions: No     Mobility  Bed Mobility               General bed mobility comments: up in recliner    Transfers Overall transfer level: Needs  assistance Equipment used: Hemi-walker Transfers: Sit to/from Stand Sit to Stand: Mod assist, +2 physical assistance           General transfer comment: +2 to stand - needing balance assistance and assist to block left knee to keep from buckling. Pt stood for ~5 minutes with HW and performed mini squats and weight shifting L to R in normal stance and with LLE advanced. Support provided under LUE    Ambulation/Gait               General Gait Details: L knee buckles, not safe to ambulate   Stairs             Wheelchair Mobility    Modified Rankin (Stroke Patients Only)       Balance Overall balance assessment: Needs assistance Sitting-balance support: No upper extremity supported, Feet supported Sitting balance-Leahy Scale: Fair Sitting balance - Comments: dizzy with movement - but settels   Standing balance support: Single extremity supported Standing balance-Leahy Scale: Poor                              Cognition Arousal/Alertness: Awake/alert Behavior During Therapy: WFL for tasks assessed/performed Overall Cognitive Status: Within Functional Limits for tasks assessed                                 General Comments: alert and oriented        Exercises General Exercises - Upper Extremity Shoulder Flexion: AAROM, Left, 10 reps, Seated General Exercises - Lower Extremity Ankle Circles/Pumps: PROM, Left, 10 reps, Supine Long Arc Quad: AAROM,  Left, 10 reps, Seated Hip Flexion/Marching: AAROM, Left, 10 reps, Seated    General Comments        Pertinent Vitals/Pain Pain Assessment Faces Pain Scale: No hurt    Home Living                          Prior Function            PT Goals (current goals can now be found in the care plan section) Acute Rehab PT Goals Patient Stated Goal: to feel better PT Goal Formulation: With patient Time For Goal Achievement: 06/13/22 Potential to Achieve Goals:  Good Progress towards PT goals: Progressing toward goals    Frequency    Min 3X/week      PT Plan Current plan remains appropriate    Co-evaluation       OT goals addressed during session: ADL's and self-care;Strengthening/ROM      AM-PAC PT "6 Clicks" Mobility   Outcome Measure  Help needed turning from your back to your side while in a flat bed without using bedrails?: A Little Help needed moving from lying on your back to sitting on the side of a flat bed without using bedrails?: A Lot Help needed moving to and from a bed to a chair (including a wheelchair)?: Total Help needed standing up from a chair using your arms (e.g., wheelchair or bedside chair)?: Total Help needed to walk in hospital room?: Total Help needed climbing 3-5 steps with a railing? : Total 6 Click Score: 9    End of Session Equipment Utilized During Treatment: Gait belt Activity Tolerance: Patient limited by fatigue Patient left: in chair;with chair alarm set;with call bell/phone within reach Nurse Communication: Mobility status PT Visit Diagnosis: Hemiplegia and hemiparesis;Difficulty in walking, not elsewhere classified (R26.2) Hemiplegia - Right/Left: Left Hemiplegia - dominant/non-dominant: Non-dominant     Time: 2633-3545 PT Time Calculation (min) (ACUTE ONLY): 19 min  Charges:  $Therapeutic Activity: 8-22 mins                     Blondell Reveal Kistler PT 06/02/2022  Acute Rehabilitation Services  Office [phone]

## 2022-06-03 LAB — RENAL FUNCTION PANEL
Albumin: 2.7 g/dL — ABNORMAL LOW (ref 3.5–5.0)
Anion gap: 8 (ref 5–15)
BUN: 24 mg/dL — ABNORMAL HIGH (ref 8–23)
CO2: 23 mmol/L (ref 22–32)
Calcium: 9.5 mg/dL (ref 8.9–10.3)
Chloride: 106 mmol/L (ref 98–111)
Creatinine, Ser: 1.22 mg/dL — ABNORMAL HIGH (ref 0.44–1.00)
GFR, Estimated: 50 mL/min — ABNORMAL LOW (ref >60)
Glucose, Bld: 91 mg/dL (ref 70–99)
Phosphorus: 3.6 mg/dL (ref 2.5–4.6)
Potassium: 3.5 mmol/L (ref 3.5–5.1)
Sodium: 137 mmol/L (ref 135–145)

## 2022-06-03 LAB — GLUCOSE, CAPILLARY
Glucose-Capillary: 97 mg/dL (ref 70–99)
Glucose-Capillary: 236 mg/dL — ABNORMAL HIGH (ref 70–99)
Glucose-Capillary: 188 mg/dL — ABNORMAL HIGH (ref 70–99)
Glucose-Capillary: 170 mg/dL — ABNORMAL HIGH (ref 70–99)

## 2022-06-03 LAB — MAGNESIUM: Magnesium: 2.1 mg/dL (ref 1.7–2.4)

## 2022-06-03 MED ORDER — LOSARTAN POTASSIUM 50 MG PO TABS
25.0000 mg | ORAL_TABLET | Freq: Every day | ORAL | Status: DC
  Administered 2022-06-03: 25 mg via ORAL
  Filled 2022-06-03: qty 1

## 2022-06-03 MED ORDER — HYDRALAZINE HCL 50 MG PO TABS
50.0000 mg | ORAL_TABLET | Freq: Three times a day (TID) | ORAL | Status: DC
  Administered 2022-06-03 – 2022-06-04 (×3): 50 mg via ORAL
  Filled 2022-06-03 (×3): qty 1

## 2022-06-03 NOTE — Plan of Care (Signed)
  Problem: Clinical Measurements: Goal: Diagnostic test results will improve Outcome: Not Progressing   Problem: Activity: Goal: Risk for activity intolerance will decrease Outcome: Not Progressing   Problem: Safety: Goal: Ability to remain free from injury will improve Outcome: Not Progressing

## 2022-06-03 NOTE — Progress Notes (Signed)
PROGRESS NOTE  [name] GBT:[id number] DOB: 03-Feb-1961   PCP: [name], [name], MD  Patient is from: Home.  Independently ambulates at baseline.  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  She has new right hemiparesis as a result of CVA.  She is already on DAPT with Plavix and Pletal.  LDL elevated to 120 despite Lipitor 80 mg daily and Zetia 10 mg daily.  A1c elevated to 11.4.  Neurology recommended continuing DAPT and referral to lipid clinic.  Therapy recommended CIR.  CIR following.  Patient is medically stable for discharge  Subjective: Seen and examined earlier this morning.  No major events overnight of this morning.  No complaints.  Reports improvement in her dizziness.  Objective: Vitals:   06/02/22 1724 06/02/22 2112 06/03/22 0508 06/03/22 1045  BP: (!) 177/92 (!) 190/88 (!) 177/77 (!) 169/78  Pulse: (!) 53 (!) 55 (!) 51 65  Resp: 18 16    Temp: 97.6 F (36.4 C) 98.5 F (36.9 C) 97.8 F (36.6 C) 98.6 F (37 C)  TempSrc: Oral Oral Oral Oral  SpO2: 96% 97% 96% 100%  Weight:      Height:        Examination:  GENERAL: No apparent distress.  Nontoxic. HEENT: MMM.  Vision and hearing grossly intact.  NECK: Supple.  No apparent JVD.  RESP:  No IWOB.  Fair aeration bilaterally. CVS:  RRR. Heart sounds normal.  ABD/GI/GU: BS+. Abd soft, NTND.  MSK/EXT:  Moves extremities.  Weakness in left arm and left leg SKIN: no apparent skin lesion or wound NEURO: Sleepy but wakes to voice.  Oriented appropriately.  Motor 3/5 in LUE and LLE. PSYCH: Calm. Normal  affect.   Procedures:  None  Microbiology summarized: [id number], influenza and RSV PCR nonreactive.  Assessment and plan: Principal Problem:   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HCC) Active Problems:   Hyperlipidemia associated with type 2 diabetes mellitus ([place])   Essential hypertension   DM (diabetes mellitus), type 2 with peripheral vascular complications (HCC)   Dizziness   Personal history of CLL (chronic lymphocytic leukemia)   Abdominal pain   AKI (acute kidney injury) ([place])  Acute stroke of right middle lobe lung data: Presents with dizziness , generalized numbness and unsteady gait.  Seems to have acute left hemiparesis on exam.  MRI/MRA showed acute ischemic stroke of right medulla with multivessel disease.  She is on Plavix, Pletal, Lipitor 80 mg and Zetia at home.  LDL elevated to 123.  A1c elevated to 11.4%.  TTE without significant finding.  Suspect noncompliance with medications. -Neurology recommended continuing DAPT, statin, Zetia and referral to lipid clinic -Resuming home antihypertensive meds -Therapy recommended CIR. CIR following.   AKI on CKD stage IIIa: Baseline Cr ~1.2-1.4.  Due to IV contrast?  Resolved. Recent Labs    09/15/21 1802 09/17/21 0340 10/01/21 1050 01/20/22 0954 03/11/22 1248 05/29/22 1145 05/31/22 0606 06/01/22 0629 06/02/22 0555 06/03/22 0456  BUN 27* 30* 36* '22 21 22 '$ 24* 26* 23 24*  CREATININE 1.13* 1.29* 1.32* 1.13* 1.47* 1.28* 1.66* 1.81* 1.32* 1.22*  -Continue monitoring.   Abdominal  pain/CLL: Follows with Dr. Irene Limbo.  CT imaging showed bulky adenopathy of the axillary, central and retroperitoneal region.  Abdominal pain resolved. -Recommend outpatient follow-up with oncology.   Uncontrolled IDDM-2 with hyperglycemia and other complications:  S3M 19.6%.suspect noncompliance with medications.  Recent Labs  Lab 06/02/22 1201 06/02/22 1307 06/02/22 1601 06/02/22 2119 06/03/22 0901  GLUCAP 184* 216* 202* 131* 97  -Continue  Semglee 15 units twice daily -Continue SSI-moderate -Discontinued mealtime coverage yesterday.  Uncontrolled hypertension: BP elevated but improved. -Continue Toprol-XL 25 mg daily.  Decreased due to bradycardia -Continue home amlodipine. -Increase hydralazine to 50 mg 3 times daily -Start losartan 25 mg daily -Continue holding lisinopril and HCT -Further adjustment as appropriate   Hyperlipidemia: LDL 123 despite maximum statin and Zetia.  Suspect noncompliance. -Continue Lipitor and Zetia -Referral to lipid clinic on discharge  Dizziness: Likely due to #1.  Improved.   Obesity: Body mass index is 30.73 kg/m.  prophylaxis:  enoxaparin (LOVENOX) injection 40 mg Start: 05/30/22 1000  Code Status: Full code Family Communication: None at bedside Level of care: Med-Surg Status is: Inpatient Remains inpatient appropriate because: Safe disposition/CIR   Final disposition: CIR Consultants:  Neurology  35 minutes with more than 50% spent in reviewing records, counseling patient/family and coordinating care.   Sch Meds:  Scheduled Meds:  amLODipine  10 mg Oral Daily   atorvastatin  80 mg Oral Daily   cilostazol  50 mg Oral BID   clopidogrel  75 mg Oral Daily   enoxaparin (LOVENOX) injection  40 mg Subcutaneous Q24H   ezetimibe  10 mg Oral Daily   FLUoxetine  20 mg Oral Daily   hydrALAZINE  50 mg Oral Q8H   insulin aspart  0-15 Units Subcutaneous TID PC & HS   insulin glargine-yfgn  15 Units Subcutaneous BID   losartan  25 mg Oral Daily   metoprolol succinate  25 mg Oral Daily   pantoprazole  40 mg Oral Daily   Continuous Infusions:   PRN Meds:.acetaminophen **OR** acetaminophen (TYLENOL) oral liquid 160 mg/5 mL **OR** acetaminophen, ALPRAZolam, hydrOXYzine, labetalol, ondansetron (ZOFRAN) IV, mouth rinse  Antimicrobials: Anti-infectives (From admission, onward)    Start     Dose/Rate Route Frequency Ordered Stop   05/29/22 1715  cefTRIAXone (ROCEPHIN) 2 g in  sodium chloride 0.9 % 100 mL IVPB        2 g 200 mL/hr over 30 Minutes Intravenous  Once 05/29/22 1712 05/29/22 1859        I have personally reviewed the following labs and images: CBC: Recent Labs  Lab 05/29/22 1145 06/02/22 0555  WBC 5.9 6.5  HGB 12.1 11.2*  HCT 36.8 35.8*  MCV 76.8* 80.3  PLT 266 239   BMP &GFR Recent Labs  Lab 05/29/22 1145 05/31/22 0606 06/01/22 0629 06/02/22 0555 06/03/22 0456  NA 136 137 140 140 137  K 3.7 3.7 4.1 3.9 3.5  CL 103 106 109 112* 106  CO2 '25 22 23 22 23  '$ GLUCOSE 196* 203* 183* 114* 91  BUN 22 24* 26* 23 24*  CREATININE 1.28* 1.66* 1.81* 1.32* 1.22*  CALCIUM 9.6 9.4 9.1 9.5 9.5  MG 2.1  --   --  2.1 2.1  PHOS  --   --   --  3.9 3.6   Estimated Creatinine Clearance: 48.7 mL/min (A) (by C-G formula based on SCr of 1.22 mg/dL (H)). Liver & Pancreas: Recent Labs  Lab 06/02/22 0555 06/03/22 0456  ALBUMIN 2.8* 2.7*   No results  for input(s): "LIPASE", "AMYLASE" in the last 168 hours. No results for input(s): "AMMONIA" in the last 168 hours. Diabetic: No results for input(s): "HGBA1C" in the last 72 hours.  Recent Labs  Lab 06/02/22 1201 06/02/22 1307 06/02/22 1601 06/02/22 2119 06/03/22 0901  GLUCAP 184* 216* 202* 131* 97   Cardiac Enzymes: No results for input(s): "CKTOTAL", "CKMB", "CKMBINDEX", "TROPONINI" in the last 168 hours. No results for input(s): "PROBNP" in the last 8760 hours. Coagulation Profile: No results for input(s): "INR", "PROTIME" in the last 168 hours. Thyroid Function Tests: No results for input(s): "TSH", "T4TOTAL", "FREET4", "T3FREE", "THYROIDAB" in the last 72 hours. Lipid Profile: No results for input(s): "CHOL", "HDL", "LDLCALC", "TRIG", "CHOLHDL", "LDLDIRECT" in the last 72 hours.  Anemia Panel: No results for input(s): "VITAMINB12", "FOLATE", "FERRITIN", "TIBC", "IRON", "RETICCTPCT" in the last 72 hours. Urine analysis:    Component Value Date/Time   COLORURINE YELLOW 05/29/2022 [place] 05/29/2022 1621   LABSPEC 1.015 05/29/2022 1621   PHURINE 6.5 05/29/2022 1621   GLUCOSEU 250 (A) 05/29/2022 1621   HGBUR MODERATE (A) 05/29/2022 1621   HGBUR negative 03/12/2010 0847   BILIRUBINUR NEGATIVE 05/29/2022 1621   BILIRUBINUR n 09/11/2015 1039   KETONESUR NEGATIVE 05/29/2022 1621   PROTEINUR >=300 (A) 05/29/2022 1621   UROBILINOGEN 1.0 09/11/2015 1039   UROBILINOGEN 1.0 05/19/2014 1858   NITRITE NEGATIVE 05/29/2022 1621   LEUKOCYTESUR NEGATIVE 05/29/2022 1621   Sepsis Labs: Invalid input(s): "PROCALCITONIN", "LACTICIDVEN"  Microbiology: Recent Results (from the past 240 hour(s))  Resp panel by RT-PCR (RSV, Flu A&B, Covid) Nasopharyngeal Swab     Status: None   Collection Time: 05/29/22 11:21 AM   Specimen: Nasopharyngeal Swab; Nasal Swab  Result Value Ref Range Status   SARS Coronavirus 2 by RT PCR NEGATIVE NEGATIVE Final    Comment: (NOTE) SARS-CoV-2 target nucleic acids are NOT DETECTED.  The SARS-CoV-2 RNA is generally detectable in upper respiratory specimens during the acute phase of infection. The lowest concentration of SARS-CoV-2 viral copies this assay can detect is 138 copies/mL. A negative result does not preclude SARS-Cov-2 infection and should not be used as the sole basis for treatment or other patient management decisions. A negative result may occur with  improper specimen collection/handling, submission of specimen other than nasopharyngeal swab, presence of viral mutation(s) within the areas targeted by this assay, and inadequate number of viral copies(<138 copies/mL). A negative result must be combined with clinical observations, patient history, and epidemiological information. The expected result is Negative.  Fact Sheet for Patients:  EntrepreneurPulse.com.au  Fact Sheet for Healthcare Providers:  IncredibleEmployment.be  This test is no t yet approved or cleared by the [place] FDA and  has been authorized for detection and/or diagnosis of SARS-CoV-2 by FDA under an Emergency Use Authorization (EUA). This EUA will remain  in effect (meaning this test can be used) for the duration of the COVID-19 declaration under Section 564(b)(1) of the Act, 21 U.S.C.section 360bbb-3(b)(1), unless the authorization is terminated  or revoked sooner.       Influenza A by PCR NEGATIVE NEGATIVE Final   Influenza B by PCR NEGATIVE NEGATIVE Final    Comment: (NOTE) The Xpert Xpress SARS-CoV-2/FLU/RSV plus assay is intended as an aid in the diagnosis of influenza from Nasopharyngeal swab specimens and should not be used as a sole basis for treatment. Nasal washings and aspirates are unacceptable for Xpert Xpress SARS-CoV-2/FLU/RSV testing.  Fact Sheet for Patients: EntrepreneurPulse.com.au  Fact Sheet for Healthcare  Providers: IncredibleEmployment.be  This test is not yet approved or cleared by the [place] and has been authorized for detection and/or diagnosis of SARS-CoV-2 by FDA under an Emergency Use Authorization (EUA). This EUA will remain in effect (meaning this test can be used) for the duration of the COVID-19 declaration under Section 564(b)(1) of the Act, 21 U.S.C. section 360bbb-3(b)(1), unless the authorization is terminated or revoked.     Resp Syncytial Virus by PCR NEGATIVE NEGATIVE Final    Comment: (NOTE) Fact Sheet for Patients: EntrepreneurPulse.com.au  Fact Sheet for Healthcare Providers: IncredibleEmployment.be  This test is not yet approved or cleared by the [place] FDA and has been authorized for detection and/or diagnosis of SARS-CoV-2 by FDA under an Emergency Use Authorization (EUA). This EUA will remain in effect (meaning this test can be used) for the duration of the COVID-19 declaration under Section 564(b)(1) of the Act, 21 U.S.C. section  360bbb-3(b)(1), unless the authorization is terminated or revoked.  Performed at [facility], [address], [place], [place] [address]     Radiology Studies: No results found.    Gabbi Whetstone T. Bono  If 7PM-7AM, please contact night-coverage [email or web address] 06/03/2022, 1:02 PM

## 2022-06-03 NOTE — Progress Notes (Signed)
Physical Therapy Treatment Patient Details Name: [name] MRN: [id number] DOB: [birth year] Today's Date: 06/03/2022   History of Present Illness Patient is a 62 year old female who presented with BLE weakness and dizziness. Patient was admitted with dizziness, abdominal pain. patients MRI results showed "acute ischemic stroke on the right aspect of medulla, multivessel disease". on 1/29 noted increased weakness in LUE and LLE than earlier in day.  PMH: CLL, DM II, HTN, hyperlipidemia, CVA,guillain-Barre,diplopia,    PT Comments    Pt ambulated 8' with +2 mod assist, assist for manually blocking L knee 2* buckling, and assist for balance. Pt performed L gastroc stretch, L knee long arc quads and ankle pumps with assistance. Good progress with mobility today. Pt remains a good AIR candidate.     Recommendations for follow up therapy are one component of a multi-disciplinary discharge planning process, led by the attending physician.  Recommendations may be updated based on patient status, additional functional criteria and insurance authorization.  Follow Up Recommendations  Acute inpatient rehab (3hours/day)     Assistance Recommended at Discharge Intermittent Supervision/Assistance  Patient can return home with the following Assist for transportation;Help with stairs or ramp for entrance;A lot of help with walking and/or transfers;A lot of help with bathing/dressing/bathroom;Assistance with cooking/housework   Equipment Recommendations  None recommended by PT    Recommendations for Other Services       Precautions / Restrictions Precautions Precautions: Fall Precaution Comments: Left Hemiparesis, Left knee buckles, support left arm in standing Restrictions Weight Bearing Restrictions: No     Mobility  Bed Mobility               General bed mobility comments: up in recliner    Transfers Overall transfer level: Needs assistance Equipment used:  Hemi-walker Transfers: Sit to/from Stand Sit to Stand: Mod assist, +2 physical assistance           General transfer comment: +2 to stand - needing balance assistance and assist to block left knee to keep from buckling, VCs for hand placement. Sit to stand x 2 trials.    Ambulation/Gait Ambulation/Gait assistance: Mod assist, +2 safety/equipment Gait Distance (Feet): 8 Feet Assistive device: Hemi-walker Gait Pattern/deviations: Step-to pattern, Decreased step length - right, Decreased step length - left, Decreased weight shift to left Gait velocity: decr     General Gait Details: therapist manually blocked L knee 2* buckling with weight shift to L, VCs for sequencing   Stairs             Wheelchair Mobility    Modified Rankin (Stroke Patients Only)       Balance Overall balance assessment: Needs assistance Sitting-balance support: No upper extremity supported, Feet supported Sitting balance-Leahy Scale: Fair     Standing balance support: Single extremity supported Standing balance-Leahy Scale: Poor                              Cognition Arousal/Alertness: Awake/alert Behavior During Therapy: WFL for tasks assessed/performed Overall Cognitive Status: Within Functional Limits for tasks assessed                                 General Comments: alert and oriented        Exercises General Exercises - Lower Extremity Quad Sets: AROM, Both, 10 reps, Supine Long Arc Quad: AAROM, Left, 10 reps, Seated Other Exercises Other Exercises:  L gastroc stretch with pt pulling on gait belt in long sitting x 5, 20 sec hold    General Comments        Pertinent Vitals/Pain Pain Assessment Pain Assessment: No/denies pain Pain Score: 0-No pain    Home Living                          Prior Function            PT Goals (current goals can now be found in the care plan section) Acute Rehab PT Goals Patient Stated Goal: to feel  better, likes to bowl and read PT Goal Formulation: With patient/family Time For Goal Achievement: 06/13/22 Potential to Achieve Goals: Good Progress towards PT goals: Progressing toward goals    Frequency    Min 3X/week      PT Plan Current plan remains appropriate    Co-evaluation PT/OT/SLP Co-Evaluation/Treatment: Yes Reason for Co-Treatment: Complexity of the patient's impairments (multi-system involvement);For patient/therapist safety;To address functional/ADL transfers PT goals addressed during session: Mobility/safety with mobility;Balance;Proper use of DME;Strengthening/ROM        AM-PAC PT "6 Clicks" Mobility   Outcome Measure  Help needed turning from your back to your side while in a flat bed without using bedrails?: A Little Help needed moving from lying on your back to sitting on the side of a flat bed without using bedrails?: A Lot Help needed moving to and from a bed to a chair (including a wheelchair)?: Total Help needed standing up from a chair using your arms (e.g., wheelchair or bedside chair)?: Total Help needed to walk in hospital room?: Total Help needed climbing 3-5 steps with a railing? : Total 6 Click Score: 9    End of Session Equipment Utilized During Treatment: Gait belt Activity Tolerance: Patient limited by fatigue Patient left: in chair;with chair alarm set;with call bell/phone within reach;with family/visitor present Nurse Communication: Mobility status PT Visit Diagnosis: Hemiplegia and hemiparesis;Difficulty in walking, not elsewhere classified (R26.2) Hemiplegia - Right/Left: Left Hemiplegia - dominant/non-dominant: Non-dominant     Time: 2751-7001 PT Time Calculation (min) (ACUTE ONLY): 34 min  Charges:  $Gait Training: 8-22 mins                     Blondell Reveal Kistler PT 06/03/2022  Acute Rehabilitation Services  Office [phone]

## 2022-06-03 NOTE — Progress Notes (Signed)
Inpatient Rehab Admissions Coordinator:    I spoke with pt.'s sister, Hassan Rowan, regarding potential CIR admit. I do think that Pt. Is a good rehab candidate and that she will require a 16-18 day stay with plans to d/c at min assist level. Discussed the fact that pt. Will need 24/7 min assist upon discharge. Hassan Rowan states that she has spoken to family members and Kamisha' close friend, Kennyth Lose, and that between herself, Kennyth Lose, and other family, they can provide the level of supervision and physical assistance that pt. Is likely to need upon d/c. States she is working out a schedule for when each person will be with Pt. I will send Pt.'s case to insurance and pursue for admit.   Clemens Catholic, [place], [place] Admissions Coordinator  [phone] ([place]) [phone] (office)

## 2022-06-03 NOTE — Progress Notes (Signed)
At this time pt has no IV meds scheduled and is tolerating PO meds well. Unit RN will consult if PIV access is needed

## 2022-06-03 NOTE — H&P (Incomplete)
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 She also reported abdominal pain that ws treated with GI cocktail. 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  UA felt to be negative for UTI. 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   Neurology felt that plavix/Pletal adequate and no need to add another anti-platelet. To refer to cardiology lipid clinic for elevated LDL despite statin/Zetia as well as hydroxyzine for nerve/odd sensation in body. BP medications resumed 02/02 and therapy ongoing. Patient noted to have cognitive deficits with mild dysarthria, dizziness with activity, left hemiparesis with left knee instability and fatigue. CIR recommended due to functional decline.    ROS   Past Medical History:  Diagnosis Date   Abnormality of gait 05/10/2010   BACK PAIN 11/14/2008   Class 1 obesity due to excess calories with body mass index (BMI) of 31.0 to 31.9 in adult 02/07/2021   DIABETES MELLITUS, TYPE II 07/15/2008   Diplopia 07/15/2008   ECZEMA, ATOPIC 04/03/2009   GERD (gastroesophageal reflux disease)    Guillain-Barre ([place]) 1988   HYPERLIPIDEMIA 03/06/2009   HYPERTENSION 07/15/2008   Stroke ([place]) 2010, 2011   x2    Vertebral artery stenosis     Past Surgical History:  Procedure Laterality  Date   ABDOMINAL HYSTERECTOMY     DILATION AND CURETTAGE OF UTERUS     FOOT SURGERY     IR ANGIO INTRA EXTRACRAN SEL COM CAROTID INNOMINATE UNI L MOD SED  09/17/2021   IR ANGIO INTRA EXTRACRAN SEL INTERNAL CAROTID UNI R MOD SED  09/17/2021   IR ANGIO VERTEBRAL SEL VERTEBRAL UNI R MOD SED  09/17/2021   IR US GUIDE VASC ACCESS RIGHT  09/17/2021    Family History  Problem Relation Age of Onset   Diabetes Sister    Asthma Other    Stroke Other    Hypertension Other    Diabetes Mother    Stroke Mother    Cancer Father        pt states hae had some kind of stomach cancer, ? stomach or colon   Breast cancer Neg Hx     Social History:  Lives alone and independent with cane PTA. Was working as a Sports coach at [employer]?  Sister and friend to assist after discharge?  Per reports that she has never smoked. She has never used smokeless tobacco. She reports that she does not drink alcohol and does not use drugs.   Allergies: No Known Allergies   Medications Prior to Admission  Medication Sig Dispense Refill   amLODipine (NORVASC) 10 MG tablet Take 1 tablet by mouth once daily 90 tablet 1   atorvastatin (LIPITOR) 80 MG tablet Take 1 tablet (80 mg total) by mouth daily. 30 tablet 3   B Complex Vitamins (VITAMIN B COMPLEX) TABS Take 1 tablet by mouth every morning.     Cholecalciferol (VITAMIN D3  PO) Take 1 capsule by mouth every morning.     cilostazol (PLETAL) 50 MG tablet Take 1 tablet by mouth twice daily 180 tablet 0   clopidogrel (PLAVIX) 75 MG tablet Take 1 tablet (75 mg total) by mouth daily. 30 tablet 3   Dulaglutide (TRULICITY) 1.61 WR/6.0AV SOPN Inject 0.75 mg into the skin once a week. 6 mL 3   ezetimibe (ZETIA) 10 MG tablet Take 1 tablet (10 mg total) by mouth daily. 30 tablet 3   FLUoxetine (PROZAC) 20 MG capsule Take 1 capsule by mouth once daily 90 capsule 2   hydrALAZINE (APRESOLINE) 50 MG tablet TAKE 1 TABLET BY MOUTH THREE TIMES DAILY 90 tablet 2   insulin glargine (LANTUS)  100 UNIT/ML Solostar Pen Inject 30 Units into the skin daily. And pen needles 1/day 15 mL 3   insulin lispro (HUMALOG KWIKPEN) 200 UNIT/ML KwikPen Inject 8 Units into the skin 3 (three) times daily before meals. 10 min before meals 15 mL 6   lisinopril-hydrochlorothiazide (ZESTORETIC) 20-12.5 MG tablet Take 2 tablets by mouth once daily 180 tablet 1   metoprolol succinate (TOPROL-XL) 50 MG 24 hr tablet TAKE 1 TABLET BY MOUTH ONCE DAILY WITH MEALS OR  IMMEDIATELY  FOLLOWING  A  MEAL (Patient taking differently: 50 mg every morning.) 30 tablet 3   pantoprazole (PROTONIX) 40 MG tablet Take 1 tablet (40 mg total) by mouth daily. 30 tablet 3   Blood Pressure Monitoring (BLOOD PRESSURE MONITOR AUTOMAT) DEVI 1 Device by Does not apply route daily. 1 Device 0   gabapentin (NEURONTIN) 300 MG capsule Take 1 capsule (300 mg total) by mouth daily for 1 day, THEN 1 capsule (300 mg total) 2 (two) times daily for 1 day, THEN 1 capsule (300 mg total) 3 (three) times daily for 28 days. 87 capsule 0   glucose blood (CONTOUR NEXT TEST) test strip 1 each by Other route 2 (two) times daily. And lancets 2/day 200 each 3   glucose blood (ONETOUCH VERIO) test strip USE TO CHECK BLOOD SUGAR TWICE A DAY AND PRN 100 each 6   ONE TOUCH LANCETS MISC USE TO CHECK BLOOD SUGAR TWICE A DAY AND PRN 100 each 6      Home: Home Living Family/patient expects to be discharged to:: Private residence Living Arrangements: Alone Available Help at Discharge: Family Type of Home: Apartment Home Access: Level entry Entrance Stairs-Number of Steps: 3 Entrance Stairs-Rails: None Home Layout: One level Bathroom Shower/Tub: Chiropodist: Standard Bathroom Accessibility: Yes Home Equipment: Conservation officer, nature (2 wheels) Additional Comments: No falls last 6 months  Lives With: Alone   Functional History: Prior Function Prior Level of Function : Independent/Modified Independent Mobility Comments: walks with a  cane  Functional Status:  Mobility: Bed Mobility Overal bed mobility: Needs Assistance Bed Mobility: Supine to Sit Supine to sit: Mod assist, HOB elevated Sit to supine: Max assist General bed mobility comments: up in recliner Transfers Overall transfer level: Needs assistance Equipment used: Hemi-walker Transfers: Sit to/from Stand Sit to Stand: Mod assist, +2 physical assistance Bed to/from chair/wheelchair/BSC transfer type:: Step pivot Step pivot transfers: Mod assist, +2 physical assistance General transfer comment: Requires hemiwalker, blocking of Left knee, verbal cues for sequencing to step. Assitance initially to advance LLE but improved thereafter. Knee hyperextending with steps. patient also not weight bearing enough through RLE at times. Ambulation/Gait Ambulation/Gait assistance: Mod assist, +2 safety/equipment Gait Distance (Feet): 8 Feet Assistive device: Hemi-walker Gait Pattern/deviations: Step-to pattern, Decreased step length -  right, Decreased step length - left, Decreased weight shift to left General Gait Details: therapist manually blocked L knee 2* buckling with weight shift to L, VCs for sequencing Gait velocity: decr    ADL: ADL Overall ADL's : Needs assistance/impaired Eating/Feeding: Sitting Eating/Feeding Details (indicate cue type and reason): EOB with set up opening small containers. Grooming: Sitting, Minimal assistance Upper Body Bathing: Moderate assistance, Sitting Lower Body Bathing: Moderate assistance, Sit to/from stand, Sitting/lateral leans Upper Body Dressing : Sitting, Minimal assistance Lower Body Dressing: Moderate assistance, Sit to/from stand, Sitting/lateral leans Lower Body Dressing Details (indicate cue type and reason): to don undergarments with noted buckling of LLE with standing. patient appeared to have decreased awareness of deficits. Toilet Transfer: Moderate assistance, +2 for physical assistance, BSC/3in1 Toilet Transfer  Details (indicate cue type and reason): Mod x 2 to pivot to recliner. one person to assist with her maintaining stand position and good hold with RUE and the other blocking knee to reduce buckle. Toileting- Clothing Manipulation and Hygiene: Total assistance, Sit to/from stand, +2 for physical assistance Toileting - Clothing Manipulation Details (indicate cue type and reason): incontinent of BM. Reports she can't feel it. Total assistfor toileting while second therapist steadied patient in standing with verbal and tactile cues for posture Functional mobility during ADLs: Minimal assistance, +2 for physical assistance General ADL Comments: Min x 2 today for standing toileting task. Focus on appropriate standing posture with verbal and tactile cues during total toiletng care .  Cognition: Cognition Overall Cognitive Status: Within Functional Limits for tasks assessed Arousal/Alertness: Awake/alert Orientation Level: Oriented X4 Year: 2024 Month: January Day of Week: Correct Attention: Sustained Sustained Attention: Appears intact Memory: Impaired (recalled correct address except one component of address recall - 1/5 components) Awareness: Impaired Awareness Impairment: Intellectual impairment (stated she thought she could get up independently but with delay, she was able to articulate fall risk) Problem Solving: Appears intact Cognition Arousal/Alertness: Awake/alert Behavior During Therapy: WFL for tasks assessed/performed Overall Cognitive Status: Within Functional Limits for tasks assessed General Comments: alert and oriented, very motivated   Blood pressure (!) 163/84, pulse 65, temperature 98.6 F (37 C), temperature source Oral, resp. rate 16, height '5\' 4"'$  (1.626 m), weight 81.2 kg, SpO2 100 %. Physical Exam  Results for orders placed or performed during the hospital encounter of 05/29/22 (from the past 48 hour(s))  Glucose, capillary     Status: None   Collection Time: 06/01/22   9:30 PM  Result Value Ref Range   Glucose-Capillary 95 70 - 99 mg/dL    Comment: Glucose reference range applies only to samples taken after fasting for at least 8 hours.  Renal function panel     Status: Abnormal   Collection Time: 06/02/22  5:55 AM  Result Value Ref Range   Sodium 140 135 - 145 mmol/L   Potassium 3.9 3.5 - 5.1 mmol/L   Chloride 112 (H) 98 - 111 mmol/L   CO2 22 22 - 32 mmol/L   Glucose, Bld 114 (H) 70 - 99 mg/dL    Comment: Glucose reference range applies only to samples taken after fasting for at least 8 hours.   BUN 23 8 - 23 mg/dL   Creatinine, Ser 1.32 (H) 0.44 - 1.00 mg/dL   Calcium 9.5 8.9 - 10.3 mg/dL   Phosphorus 3.9 2.5 - 4.6 mg/dL   Albumin 2.8 (L) 3.5 - 5.0 g/dL   GFR, Estimated 45 (L) >60 mL/min    Comment: (NOTE) Calculated using the CKD-EPI Creatinine  Equation (2021)    Anion gap 6 5 - 15    Comment: Performed at [facility], [place] [address], [place], [place] [address]  Magnesium     Status: None   Collection Time: 06/02/22  5:55 AM  Result Value Ref Range   Magnesium 2.1 1.7 - 2.4 mg/dL    Comment: Performed at [facility], [place] [address], [place], [place] [address]  CBC     Status: Abnormal   Collection Time: 06/02/22  5:55 AM  Result Value Ref Range   WBC 6.5 4.0 - 10.5 K/uL   RBC 4.46 3.87 - 5.11 MIL/uL   Hemoglobin 11.2 (L) 12.0 - 15.0 g/dL   HCT 35.8 (L) 36.0 - 46.0 %   MCV 80.3 80.0 - 100.0 fL   MCH 25.1 (L) 26.0 - 34.0 pg   MCHC 31.3 30.0 - 36.0 g/dL   RDW 15.4 11.5 - 15.5 %   Platelets 239 150 - 400 K/uL   nRBC 0.0 0.0 - 0.2 %    Comment: Performed at [facility], [place] [address], [place], [place] [address]  Glucose, capillary     Status: Abnormal   Collection Time: 06/02/22  7:40 AM  Result Value Ref Range   Glucose-Capillary 104 (H) 70 - 99 mg/dL    Comment: Glucose reference range applies only to samples taken after fasting for at least 8 hours.  Glucose, capillary      Status: Abnormal   Collection Time: 06/02/22 12:01 PM  Result Value Ref Range   Glucose-Capillary 184 (H) 70 - 99 mg/dL    Comment: Glucose reference range applies only to samples taken after fasting for at least 8 hours.  Glucose, capillary     Status: Abnormal   Collection Time: 06/02/22  1:07 PM  Result Value Ref Range   Glucose-Capillary 216 (H) 70 - 99 mg/dL    Comment: Glucose reference range applies only to samples taken after fasting for at least 8 hours.  Glucose, capillary     Status: Abnormal   Collection Time: 06/02/22  4:01 PM  Result Value Ref Range   Glucose-Capillary 202 (H) 70 - 99 mg/dL    Comment: Glucose reference range applies only to samples taken after fasting for at least 8 hours.  Glucose, capillary     Status: Abnormal   Collection Time: 06/02/22  9:19 PM  Result Value Ref Range   Glucose-Capillary 131 (H) 70 - 99 mg/dL    Comment: Glucose reference range applies only to samples taken after fasting for at least 8 hours.  Renal function panel     Status: Abnormal   Collection Time: 06/03/22  4:56 AM  Result Value Ref Range   Sodium 137 135 - 145 mmol/L   Potassium 3.5 3.5 - 5.1 mmol/L   Chloride 106 98 - 111 mmol/L   CO2 23 22 - 32 mmol/L   Glucose, Bld 91 70 - 99 mg/dL    Comment: Glucose reference range applies only to samples taken after fasting for at least 8 hours.   BUN 24 (H) 8 - 23 mg/dL   Creatinine, Ser 1.22 (H) 0.44 - 1.00 mg/dL   Calcium 9.5 8.9 - 10.3 mg/dL   Phosphorus 3.6 2.5 - 4.6 mg/dL   Albumin 2.7 (L) 3.5 - 5.0 g/dL   GFR, Estimated 50 (L) >60 mL/min    Comment: (NOTE) Calculated using the CKD-EPI Creatinine Equation (2021)    Anion gap 8 5 -  15    Comment: Performed at [facility], [place] [address] [place], [place] [address]  Magnesium     Status: None   Collection Time: 06/03/22  4:56 AM  Result Value Ref Range   Magnesium 2.1 1.7 - 2.4 mg/dL    Comment: Performed at [facility], [place]  [address] [place], [place] [address]  Glucose, capillary     Status: None   Collection Time: 06/03/22  9:01 AM  Result Value Ref Range   Glucose-Capillary 97 70 - 99 mg/dL    Comment: Glucose reference range applies only to samples taken after fasting for at least 8 hours.  Glucose, capillary     Status: Abnormal   Collection Time: 06/03/22  1:21 PM  Result Value Ref Range   Glucose-Capillary 236 (H) 70 - 99 mg/dL    Comment: Glucose reference range applies only to samples taken after fasting for at least 8 hours.   No results found.    Blood pressure (!) 163/84, pulse 65, temperature 98.6 F (37 C), temperature source Oral, resp. rate 16, height '5\' 4"'$  (1.626 m), weight 81.2 kg, SpO2 100 %.  Medical Problem List and Plan: 1. Functional deficits secondary to ***  -patient may *** shower  -ELOS/Goals: *** 2.  Antithrombotics: -DVT/anticoagulation:  Pharmaceutical: Lovenox  -antiplatelet therapy: Plavix/Pletal 3. Pain Management:  Tylenol prn.  4. Mood/Behavior/Sleep:  LCSW to follow for evaluation and support.   -antipsychotic agents: N/A  --trazodone prn for insomnia.  5. Neuropsych/cognition: This patient *** capable of making decisions on *** own behalf. 6. Skin/Wound Care: Routine pressure relief measures.  7. Fluids/Electrolytes/Nutrition: Monitor I/O. Check CMET in am 8. HTN; Monitor BP TID--avoid hypoperfusion.  --continue hydralazine, losartan, amlodipine and metoprolol 9. T2DM w/peripheral neuroapthy: Hgb A1C- 11.4 (improving >14 11/23) Poorly controlled.   --was on Trulicity weekly, lantus 30 units and novolog 8 units TID ac. --Continue insulin glargine 15 units BID with 5 units meal coverage TID. Titrate as indicated.   --am hypoglycemia noted. Will change SSI to sensitive.  10. Elevated lipids: On Lipitor and Zetia.  11. PVD: On pletal and atorvastatin. 12. H/o depression/anxiety: Continue Prozax with xanax/hydroxyzine prn.  13. Obesity: BMI-30. Educate on diet and  wt loss to help promote health and mobility.  14. CKD: Baseline SCr 1.3-1.4. Avoid nephrotoxic agents.   --note rise in BUN--encourage fluid intake. Monitor with serial checks.  15. Dyslipidemia: Continue Zetia/Lipitor.  --Refer to cardiology for  PCSK9? (Has seen Dr. [name] for PAD)    ***  Bary Leriche, PA-C 06/03/2022

## 2022-06-03 NOTE — Progress Notes (Signed)
Occupational Therapy Treatment Patient Details Name: [name] MRN: [id number] DOB: 13-Mar-1961 Today's Date: 06/03/2022   History of present illness Patient is a 62 year old female who presented with BLE weakness and dizziness. Patient was admitted with dizziness, abdominal pain. patients MRI results showed "acute ischemic stroke on the right aspect of medulla, multivessel disease". on 1/29 noted increased weakness in LUE and LLE than earlier in day.  PMH: CLL, DM II, HTN, hyperlipidemia, CVA,guillain-Barre,diplopia,   OT comments  Treatment focused on neuromuscular re-education to LUE. Patient found to be incontinent of BM initially and required total assist for perianal care. As patient could not really assist with clean up - had patient focus on standing upright and maintaining a neutral neck position (she is keeping her head tilted and turned to the right) and keeping arm straight at her side.  After ambulation -Therapist taped patient's hand to hemi-walker, as she does not have functional grasp yet, to promote initiation and active movement of shoulder flexion, shoulder extension and tricep extension. Patient required verbal and tactile cues to limit abnormal muscle recruitment, facilitation and muscle tapping from therapist. Therapist also had patient work on activating wrist extension with patient was able to do x 5 through just past neutral and trying to activate finger extension - only exhibited trace movement.   Patient working very hard and is open to all instruction. She was also able to verbalize instructions from therapist yesterday - to not work on finger flexion (aka a squeeze ball) until she had more active finger extension and to try very hard to limit compensatory movements - she is already prone to shoulder hiking and elbow flexion and supination. Patient has already demonstrated improved movement in LUE. Continue to recommend aggressive rehab.   Recommendations for follow up  therapy are one component of a multi-disciplinary discharge planning process, led by the attending physician.  Recommendations may be updated based on patient status, additional functional criteria and insurance authorization.    Follow Up Recommendations  Acute inpatient rehab (3hours/day)     Assistance Recommended at Discharge Frequent or constant Supervision/Assistance  Patient can return home with the following  Two people to help with walking and/or transfers;A lot of help with bathing/dressing/bathroom;Assistance with cooking/housework;Direct supervision/assist for medications management;Assist for transportation;Help with stairs or ramp for entrance;Direct supervision/assist for financial management   Equipment Recommendations  Other (comment) (Defer)    Recommendations for Other Services      Precautions / Restrictions Precautions Precautions: Fall Precaution Comments: Left Hemiparesis, Left knee buckles, support left arm in standing Restrictions Weight Bearing Restrictions: No       Mobility Bed Mobility               General bed mobility comments: up in recliner    Transfers Overall transfer level: Needs assistance Equipment used: Hemi-walker Transfers: Sit to/from Stand Sit to Stand: Mod assist, +2 physical assistance     Step pivot transfers: Mod assist, +2 physical assistance     General transfer comment: Requires hemiwalker, blocking of Left knee, verbal cues for sequencing to step. Assitance initially to advance LLE but improved thereafter. Knee hyperextending with steps. patient also not weight bearing enough through RLE at times.     Balance Overall balance assessment: Needs assistance Sitting-balance support: No upper extremity supported, Feet supported Sitting balance-Leahy Scale: Fair     Standing balance support: Single extremity supported Standing balance-Leahy Scale: Poor  ADL either performed or  assessed with clinical judgement   ADL Overall ADL's : Needs assistance/impaired                             Toileting- Clothing Manipulation and Hygiene: Total assistance;Sit to/from stand;+2 for physical assistance Toileting - Clothing Manipulation Details (indicate cue type and reason): incontinent of BM. Reports she can't feel it. Total assistfor toileting while second therapist steadied patient in standing with verbal and tactile cues for posture     Functional mobility during ADLs: Minimal assistance;+2 for physical assistance General ADL Comments: Min x 2 today for standing toileting task. Focus on appropriate standing posture with verbal and tactile cues during total toiletng care .    Extremity/Trunk Assessment              Vision   Vision Assessment?: No apparent visual deficits   Perception     Praxis      Cognition Arousal/Alertness: Awake/alert Behavior During Therapy: WFL for tasks assessed/performed Overall Cognitive Status: Within Functional Limits for tasks assessed                                 General Comments: alert and oriented, very motivated        Exercises Other Exercises Other Exercises: Therapist taped patient's left hand on tipped hemiwalker as she does not have a functional grasp - to work on activating forward flexion/shoulder extension and full tricep extension. Other Exercises: Wrist extension reps x 5 - with therapist supporting forearm and limiting compensatory movemetns (supination, elbow flexion) Other Exercises: PROM/facilitation to promote finger extension    Shoulder Instructions       General Comments      Pertinent Vitals/ Pain       Pain Assessment Pain Assessment: No/denies pain  Home Living                                          Prior Functioning/Environment              Frequency  Min 2X/week        Progress Toward Goals  OT Goals(current goals can now be  found in the care plan section)  Progress towards OT goals: Progressing toward goals  Acute Rehab OT Goals Patient Stated Goal: to use left arm OT Goal Formulation: With patient Time For Goal Achievement: 06/13/22 Potential to Achieve Goals: [place] Discharge plan remains appropriate    Co-evaluation    PT/OT/SLP Co-Evaluation/Treatment: Yes Reason for Co-Treatment: For patient/therapist safety;To address functional/ADL transfers PT goals addressed during session: Mobility/safety with mobility OT goals addressed during session: ADL's and self-care;Other (comment) (neuro-reed)      AM-PAC OT "6 Clicks" Daily Activity     Outcome Measure   Help from another person eating meals?: A Little Help from another person taking care of personal grooming?: A Little Help from another person toileting, which includes using toliet, bedpan, or urinal?: Total Help from another person bathing (including washing, rinsing, drying)?: A Lot Help from another person to put on and taking off regular upper body clothing?: A Lot Help from another person to put on and taking off regular lower body clothing?: Total 6 Click Score: 12    End of Session Equipment Utilized During Treatment:  Gait belt  OT Visit Diagnosis: Unsteadiness on feet (R26.81);Other abnormalities of gait and mobility (R26.89);Muscle weakness (generalized) (M62.81)   Activity Tolerance Patient tolerated treatment well   Patient Left with chair alarm set;with call bell/phone within reach;in chair;with family/visitor present   Nurse Communication Mobility status        Time: 2122-4825 OT Time Calculation (min): 31 min  Charges: OT General Charges $OT Visit: 1 Visit OT Treatments $Neuromuscular Re-education: 8-22 mins  Gustavo Lah, OTR/L [place]  Office [phone]   Lenward Chancellor 06/03/2022, 1:38 PM

## 2022-06-04 ENCOUNTER — Inpatient Hospital Stay (HOSPITAL_COMMUNITY)
Admission: RE | Admit: 2022-06-04 | Discharge: 2022-06-12 | DRG: 056 | Disposition: A | Discharge status: Other Acute Inpatient Hospital | Payer: Commercial Managed Care - HMO | Source: Other Acute Inpatient Hospital | Attending: Physical Medicine & Rehabilitation | Admitting: Physical Medicine & Rehabilitation

## 2022-06-04 DIAGNOSIS — R1310 Dysphagia, unspecified: Secondary | ICD-10-CM | POA: Diagnosis not present

## 2022-06-04 DIAGNOSIS — Z823 Family history of stroke: Secondary | ICD-10-CM

## 2022-06-04 DIAGNOSIS — Z90710 Acquired absence of both cervix and uterus: Secondary | ICD-10-CM | POA: Diagnosis not present

## 2022-06-04 DIAGNOSIS — G8194 Hemiplegia, unspecified affecting left nondominant side: Secondary | ICD-10-CM

## 2022-06-04 DIAGNOSIS — E1122 Type 2 diabetes mellitus with diabetic chronic kidney disease: Secondary | ICD-10-CM | POA: Diagnosis present

## 2022-06-04 DIAGNOSIS — I63312 Cerebral infarction due to thrombosis of left middle cerebral artery: Secondary | ICD-10-CM | POA: Diagnosis not present

## 2022-06-04 DIAGNOSIS — Z833 Family history of diabetes mellitus: Secondary | ICD-10-CM | POA: Diagnosis not present

## 2022-06-04 DIAGNOSIS — E1142 Type 2 diabetes mellitus with diabetic polyneuropathy: Secondary | ICD-10-CM | POA: Diagnosis present

## 2022-06-04 DIAGNOSIS — I63012 Cerebral infarction due to thrombosis of left vertebral artery: Secondary | ICD-10-CM | POA: Diagnosis not present

## 2022-06-04 DIAGNOSIS — F32A Depression, unspecified: Secondary | ICD-10-CM | POA: Diagnosis present

## 2022-06-04 DIAGNOSIS — F419 Anxiety disorder, unspecified: Secondary | ICD-10-CM | POA: Diagnosis present

## 2022-06-04 DIAGNOSIS — Z809 Family history of malignant neoplasm, unspecified: Secondary | ICD-10-CM

## 2022-06-04 DIAGNOSIS — I6389 Other cerebral infarction: Secondary | ICD-10-CM | POA: Diagnosis not present

## 2022-06-04 DIAGNOSIS — R32 Unspecified urinary incontinence: Secondary | ICD-10-CM | POA: Diagnosis present

## 2022-06-04 DIAGNOSIS — Z825 Family history of asthma and other chronic lower respiratory diseases: Secondary | ICD-10-CM | POA: Diagnosis not present

## 2022-06-04 DIAGNOSIS — F064 Anxiety disorder due to known physiological condition: Secondary | ICD-10-CM | POA: Insufficient documentation

## 2022-06-04 DIAGNOSIS — N189 Chronic kidney disease, unspecified: Secondary | ICD-10-CM | POA: Diagnosis present

## 2022-06-04 DIAGNOSIS — K219 Gastro-esophageal reflux disease without esophagitis: Secondary | ICD-10-CM | POA: Diagnosis present

## 2022-06-04 DIAGNOSIS — R471 Dysarthria and anarthria: Secondary | ICD-10-CM | POA: Diagnosis not present

## 2022-06-04 DIAGNOSIS — E1151 Type 2 diabetes mellitus with diabetic peripheral angiopathy without gangrene: Secondary | ICD-10-CM | POA: Diagnosis present

## 2022-06-04 DIAGNOSIS — I69354 Hemiplegia and hemiparesis following cerebral infarction affecting left non-dominant side: Secondary | ICD-10-CM | POA: Diagnosis present

## 2022-06-04 DIAGNOSIS — E11649 Type 2 diabetes mellitus with hypoglycemia without coma: Secondary | ICD-10-CM | POA: Diagnosis present

## 2022-06-04 DIAGNOSIS — N1831 Chronic kidney disease, stage 3a: Secondary | ICD-10-CM | POA: Diagnosis present

## 2022-06-04 DIAGNOSIS — I639 Cerebral infarction, unspecified: Secondary | ICD-10-CM | POA: Diagnosis not present

## 2022-06-04 DIAGNOSIS — E785 Hyperlipidemia, unspecified: Secondary | ICD-10-CM | POA: Diagnosis present

## 2022-06-04 DIAGNOSIS — Z6830 Body mass index (BMI) 30.0-30.9, adult: Secondary | ICD-10-CM

## 2022-06-04 DIAGNOSIS — I129 Hypertensive chronic kidney disease with stage 1 through stage 4 chronic kidney disease, or unspecified chronic kidney disease: Secondary | ICD-10-CM | POA: Diagnosis present

## 2022-06-04 DIAGNOSIS — Z8572 Personal history of non-Hodgkin lymphomas: Secondary | ICD-10-CM

## 2022-06-04 DIAGNOSIS — M25362 Other instability, left knee: Secondary | ICD-10-CM | POA: Diagnosis present

## 2022-06-04 DIAGNOSIS — D649 Anemia, unspecified: Secondary | ICD-10-CM | POA: Diagnosis present

## 2022-06-04 DIAGNOSIS — Z8249 Family history of ischemic heart disease and other diseases of the circulatory system: Secondary | ICD-10-CM | POA: Diagnosis not present

## 2022-06-04 DIAGNOSIS — E669 Obesity, unspecified: Secondary | ICD-10-CM | POA: Diagnosis present

## 2022-06-04 DIAGNOSIS — R4701 Aphasia: Secondary | ICD-10-CM | POA: Diagnosis not present

## 2022-06-04 DIAGNOSIS — E860 Dehydration: Secondary | ICD-10-CM | POA: Diagnosis present

## 2022-06-04 DIAGNOSIS — R3915 Urgency of urination: Secondary | ICD-10-CM | POA: Diagnosis present

## 2022-06-04 DIAGNOSIS — I10 Essential (primary) hypertension: Secondary | ICD-10-CM | POA: Diagnosis not present

## 2022-06-04 DIAGNOSIS — N184 Chronic kidney disease, stage 4 (severe): Secondary | ICD-10-CM | POA: Diagnosis not present

## 2022-06-04 DIAGNOSIS — R0603 Acute respiratory distress: Secondary | ICD-10-CM | POA: Diagnosis not present

## 2022-06-04 DIAGNOSIS — Z856 Personal history of leukemia: Secondary | ICD-10-CM

## 2022-06-04 DIAGNOSIS — J690 Pneumonitis due to inhalation of food and vomit: Secondary | ICD-10-CM | POA: Diagnosis not present

## 2022-06-04 DIAGNOSIS — E119 Type 2 diabetes mellitus without complications: Secondary | ICD-10-CM | POA: Diagnosis present

## 2022-06-04 LAB — GLUCOSE, CAPILLARY
Glucose-Capillary: 129 mg/dL — ABNORMAL HIGH (ref 70–99)
Glucose-Capillary: 186 mg/dL — ABNORMAL HIGH (ref 70–99)
Glucose-Capillary: 269 mg/dL — ABNORMAL HIGH (ref 70–99)
Glucose-Capillary: 235 mg/dL — ABNORMAL HIGH (ref 70–99)

## 2022-06-04 MED ORDER — LOSARTAN POTASSIUM 50 MG PO TABS: 50.0000 mg | ORAL_TABLET | Freq: Every day | ORAL | Status: DC

## 2022-06-04 MED ORDER — INSULIN ASPART 100 UNIT/ML IJ SOLN: 0.0000 [IU] | Freq: Three times a day (TID) | INTRAMUSCULAR | 11 refills | Status: DC

## 2022-06-04 MED ORDER — INSULIN ASPART 100 UNIT/ML IJ SOLN
0.0000 [IU] | Freq: Every day | INTRAMUSCULAR | Status: DC
  Administered 2022-06-04 – 2022-06-06 (×2): 2 [IU] via SUBCUTANEOUS

## 2022-06-04 MED ORDER — AMLODIPINE BESYLATE 10 MG PO TABS
10.0000 mg | ORAL_TABLET | Freq: Every day | ORAL | Status: DC
  Administered 2022-06-04 – 2022-06-12 (×9): 10 mg via ORAL
  Filled 2022-06-04 (×9): qty 1

## 2022-06-04 MED ORDER — INSULIN ASPART 100 UNIT/ML IJ SOLN
0.0000 [IU] | Freq: Three times a day (TID) | INTRAMUSCULAR | Status: DC
  Administered 2022-06-04: 5 [IU] via SUBCUTANEOUS
  Administered 2022-06-04: 2 [IU] via SUBCUTANEOUS
  Administered 2022-06-05: 1 [IU] via SUBCUTANEOUS
  Administered 2022-06-05: 3 [IU] via SUBCUTANEOUS
  Administered 2022-06-05: 1 [IU] via SUBCUTANEOUS
  Administered 2022-06-06: 2 [IU] via SUBCUTANEOUS
  Administered 2022-06-07: 1 [IU] via SUBCUTANEOUS
  Administered 2022-06-07: 3 [IU] via SUBCUTANEOUS
  Administered 2022-06-08: 2 [IU] via SUBCUTANEOUS
  Administered 2022-06-08: 3 [IU] via SUBCUTANEOUS
  Administered 2022-06-10: 5 [IU] via SUBCUTANEOUS
  Administered 2022-06-11: 2 [IU] via SUBCUTANEOUS
  Administered 2022-06-11: 3 [IU] via SUBCUTANEOUS
  Administered 2022-06-12: 1 [IU] via SUBCUTANEOUS

## 2022-06-04 MED ORDER — METOPROLOL SUCCINATE ER 25 MG PO TB24: 25.0000 mg | ORAL_TABLET | Freq: Every day | ORAL | Status: DC

## 2022-06-04 MED ORDER — TRAZODONE HCL 50 MG PO TABS
25.0000 mg | ORAL_TABLET | Freq: Every evening | ORAL | Status: DC | PRN
  Administered 2022-06-11: 50 mg via ORAL
  Filled 2022-06-04: qty 1

## 2022-06-04 MED ORDER — DIPHENHYDRAMINE HCL 12.5 MG/5ML PO ELIX: 12.5000 mg | ORAL_SOLUTION | Freq: Four times a day (QID) | ORAL | Status: DC | PRN

## 2022-06-04 MED ORDER — METOPROLOL SUCCINATE ER 25 MG PO TB24
25.0000 mg | ORAL_TABLET | Freq: Every day | ORAL | Status: DC
  Administered 2022-06-04 – 2022-06-12 (×9): 25 mg via ORAL
  Filled 2022-06-04 (×9): qty 1

## 2022-06-04 MED ORDER — INSULIN GLARGINE-YFGN 100 UNIT/ML ~~LOC~~ SOLN
15.0000 [IU] | Freq: Two times a day (BID) | SUBCUTANEOUS | Status: DC
  Administered 2022-06-04 – 2022-06-09 (×11): 15 [IU] via SUBCUTANEOUS
  Filled 2022-06-04 (×12): qty 0.15

## 2022-06-04 MED ORDER — INSULIN GLARGINE-YFGN 100 UNIT/ML ~~LOC~~ SOLN: 15.0000 [IU] | Freq: Two times a day (BID) | SUBCUTANEOUS | 11 refills | Status: DC

## 2022-06-04 MED ORDER — ALPRAZOLAM 0.25 MG PO TABS
0.2500 mg | ORAL_TABLET | Freq: Three times a day (TID) | ORAL | Status: DC | PRN
  Administered 2022-06-10 – 2022-06-12 (×2): 0.25 mg via ORAL
  Filled 2022-06-04 (×2): qty 1

## 2022-06-04 MED ORDER — HYDRALAZINE HCL 50 MG PO TABS
50.0000 mg | ORAL_TABLET | Freq: Three times a day (TID) | ORAL | Status: DC
  Administered 2022-06-04 – 2022-06-12 (×25): 50 mg via ORAL
  Filled 2022-06-04 (×25): qty 1

## 2022-06-04 MED ORDER — EZETIMIBE 10 MG PO TABS
10.0000 mg | ORAL_TABLET | Freq: Every day | ORAL | Status: DC
  Administered 2022-06-04 – 2022-06-12 (×9): 10 mg via ORAL
  Filled 2022-06-04 (×9): qty 1

## 2022-06-04 MED ORDER — ACETAMINOPHEN 325 MG PO TABS
325.0000 mg | ORAL_TABLET | ORAL | Status: DC | PRN
  Administered 2022-06-10 (×2): 650 mg via ORAL
  Filled 2022-06-04 (×2): qty 2

## 2022-06-04 MED ORDER — ALUM & MAG HYDROXIDE-SIMETH 200-200-20 MG/5ML PO SUSP: 30.0000 mL | ORAL | Status: DC | PRN

## 2022-06-04 MED ORDER — HYDROXYZINE HCL 25 MG PO TABS
25.0000 mg | ORAL_TABLET | Freq: Four times a day (QID) | ORAL | Status: DC | PRN
  Administered 2022-06-11 – 2022-06-12 (×2): 25 mg via ORAL
  Filled 2022-06-04 (×2): qty 1

## 2022-06-04 MED ORDER — INSULIN ASPART 100 UNIT/ML IJ SOLN
5.0000 [IU] | Freq: Three times a day (TID) | INTRAMUSCULAR | Status: DC
  Administered 2022-06-04 – 2022-06-07 (×10): 5 [IU] via SUBCUTANEOUS

## 2022-06-04 MED ORDER — ENOXAPARIN SODIUM 40 MG/0.4ML IJ SOSY
40.0000 mg | PREFILLED_SYRINGE | INTRAMUSCULAR | Status: DC
  Administered 2022-06-04 – 2022-06-12 (×9): 40 mg via SUBCUTANEOUS
  Filled 2022-06-04 (×9): qty 0.4

## 2022-06-04 MED ORDER — CLOPIDOGREL BISULFATE 75 MG PO TABS
75.0000 mg | ORAL_TABLET | Freq: Every day | ORAL | Status: DC
  Administered 2022-06-04 – 2022-06-12 (×9): 75 mg via ORAL
  Filled 2022-06-04 (×9): qty 1

## 2022-06-04 MED ORDER — POLYETHYLENE GLYCOL 3350 17 G PO PACK: 17.0000 g | PACK | Freq: Every day | ORAL | Status: DC | PRN

## 2022-06-04 MED ORDER — CILOSTAZOL 50 MG PO TABS
50.0000 mg | ORAL_TABLET | Freq: Two times a day (BID) | ORAL | Status: DC
  Administered 2022-06-04 – 2022-06-12 (×17): 50 mg via ORAL
  Filled 2022-06-04 (×20): qty 1

## 2022-06-04 MED ORDER — ORAL CARE MOUTH RINSE: 15.0000 mL | OROMUCOSAL | Status: DC | PRN

## 2022-06-04 MED ORDER — FLUOXETINE HCL 20 MG PO CAPS
20.0000 mg | ORAL_CAPSULE | Freq: Every day | ORAL | Status: DC
  Administered 2022-06-04 – 2022-06-12 (×9): 20 mg via ORAL
  Filled 2022-06-04 (×9): qty 1

## 2022-06-04 MED ORDER — PROCHLORPERAZINE EDISYLATE 10 MG/2ML IJ SOLN: 5.0000 mg | Freq: Four times a day (QID) | INTRAMUSCULAR | Status: DC | PRN

## 2022-06-04 MED ORDER — GUAIFENESIN-DM 100-10 MG/5ML PO SYRP: 5.0000 mL | ORAL_SOLUTION | Freq: Four times a day (QID) | ORAL | Status: DC | PRN

## 2022-06-04 MED ORDER — PANTOPRAZOLE SODIUM 40 MG PO TBEC
40.0000 mg | DELAYED_RELEASE_TABLET | Freq: Every day | ORAL | Status: DC
  Administered 2022-06-04 – 2022-06-12 (×9): 40 mg via ORAL
  Filled 2022-06-04 (×9): qty 1

## 2022-06-04 MED ORDER — ATORVASTATIN CALCIUM 80 MG PO TABS
80.0000 mg | ORAL_TABLET | Freq: Every day | ORAL | Status: DC
  Administered 2022-06-04 – 2022-06-12 (×9): 80 mg via ORAL
  Filled 2022-06-04 (×9): qty 1

## 2022-06-04 MED ORDER — PROCHLORPERAZINE MALEATE 5 MG PO TABS: 5.0000 mg | ORAL_TABLET | Freq: Four times a day (QID) | ORAL | Status: DC | PRN

## 2022-06-04 MED ORDER — PROCHLORPERAZINE 25 MG RE SUPP: 12.5000 mg | Freq: Four times a day (QID) | RECTAL | Status: DC | PRN

## 2022-06-04 MED ORDER — BISACODYL 10 MG RE SUPP: 10.0000 mg | Freq: Every day | RECTAL | Status: DC | PRN

## 2022-06-04 MED ORDER — FLEET ENEMA 7-19 GM/118ML RE ENEM: 1.0000 | ENEMA | Freq: Once | RECTAL | Status: DC | PRN

## 2022-06-04 NOTE — Progress Notes (Signed)
Inpatient Rehabilitation Admission Medication Review by a Pharmacist  A complete drug regimen review was completed for this patient to identify any potential clinically significant medication issues.  High Risk Drug Classes Is patient taking? Indication by Medication  Antipsychotic Yes Prochlorperazine - nausea  Anticoagulant Yes Enoxaparin - DVT ppx  Antibiotic No   Opioid No   Antiplatelet Yes Clopidogrel - hx CVA  Hypoglycemics/insulin Yes Insulin glargine, aspart - T2DM  Vasoactive Medication Yes Amlodipine, hydralazine, losartan, carvedilol - HTN  Chemotherapy No   Other Yes Atorvastatin, zetia, cilostazol - HLD/CVA Fluoxetine - mood Pantoprazole - GERD Dulaglutide - T2DM Alprazolam, hydroxyzine - anxiety Tramadol - sleep     Type of Medication Issue Identified Description of Issue Recommendation(s)  Drug Interaction(s) (clinically significant)     Duplicate Therapy     Allergy     No Medication Administration End Date     Incorrect Dose     Additional Drug Therapy Needed     Significant med changes from prior encounter (inform family/care partners about these prior to discharge). Home meds not restarted: dulaglutide Restart home meds as able or at discharge  Other       Clinically significant medication issues were identified that warrant physician communication and completion of prescribed/recommended actions by midnight of the next day:  No   Time spent performing this drug regimen review (minutes):  [place], PharmD, [facility] PGY1 Pharmacy Resident 06/04/2022 12:06 PM

## 2022-06-04 NOTE — Progress Notes (Signed)
Inpatient Rehab Admissions Coordinator:    I have a CIR bed for this Pt. And will arrange transport for her. RN may call report to [phone].   I spoke with Pt and sister and obtained consent for transfer and confirmed that the plan is for Pt. To participate in intensive rehab for 14-16 days with the goal of discharging at min A-supervision level with PT/OT/SLP. Also confirmed that Pt.'s disposition plan is to return to her home, with her sister, Aurelio Brash and Azzie Roup providing caregiver support (with other family to assist PRN).   Clemens Catholic, [place], [place] Admissions Coordinator  [phone] ([place]) [phone] (office)

## 2022-06-04 NOTE — Discharge Summary (Signed)
Physician Discharge Summary  [name] CWC:[id number] DOB: 27-Dec-1960 DOA: 05/29/2022  PCP: [name], [name], MD  Admit date: 05/29/2022 Discharge date: 06/04/2022 Admitted From: Home Disposition: CIR Recommendations for Outpatient Follow-up:  Follow up hematology/oncology in 2 to 3 weeks Outpatient follow-up with neurology in 4 to 6 weeks Ambulatory referral to lipid clinic ordered. Assess blood pressure and adjust antihypertensive meds as appropriate.  Avoid hypotension. 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 She has new right hemiparesis as a result of CVA. She is already on DAPT with Plavix and Pletal. LDL elevated to 120 despite Lipitor 80 mg daily and Zetia 10 mg daily. A1c elevated to 11.4. Neurology recommended continuing DAPT and referral to lipid clinic. Therapy recommended CIR.   Recommend titrating antihypertensive meds with caution to avoid hypotensive.  Recommend outpatient follow-up with neurology in 4 to 6 weeks. Ambulatory referral to lipid clinic ordered.   See individual problem list below for more.   Problems addressed during this hospitalization Principal Problem:   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HCC) Active Problems:   Hyperlipidemia associated with type 2 diabetes mellitus ([place])   Essential hypertension   DM (diabetes mellitus), type 2 with peripheral vascular complications (HCC)   Dizziness   Personal history of CLL (chronic lymphocytic leukemia)   Abdominal pain   AKI (acute kidney injury) ([place])   Acute stroke of right middle lobe  lung data: Presents with dizziness , generalized numbness and unsteady gait.  She has left hemiparesis.  MRI/MRA showed acute ischemic stroke of right medulla with multivessel disease.   TTE without significant finding. She is on Plavix, Pletal, Lipitor 80 mg and Zetia at home.  LDL elevated to 123.  A1c elevated to 11.4%.  Suspect noncompliance with medications contributing to poorly controlled diabetes and hyperlipidemia. -Neurology recommended continuing DAPT, statin, Zetia and referral to lipid clinic -Ambulatory referral to lipid clinic ordered. -Slowly titrate antihypertensive meds to avoid hypotension -Continue therapy.   AKI on CKD stage IIIa: Baseline Cr ~1.2-1.4.  Due to IV contrast?  Resolved. Recent Labs    09/15/21 1802 09/17/21 0340 10/01/21 1050 01/20/22 0954 03/11/22 1248 05/29/22 1145 05/31/22 0606 06/01/22 0629 06/02/22 0555 06/03/22 0456  BUN 27* 30* 36* '22 21 22 '$ 24* 26* 23 24*  CREATININE 1.13* 1.29* 1.32* 1.13* 1.47* 1.28* 1.66* 1.81* 1.32* 1.22*  -Check BMP in 1 to 2 weeks.   Abdominal pain/CLL: Follows with Dr. Irene Limbo.  CT imaging showed bulky adenopathy of the axillary, central and retroperitoneal region.  Abdominal pain resolved. -Recommend outpatient follow-up with oncology.   Uncontrolled IDDM-2 with hyperglycemia and other complications:  V6H 60.7%.suspect noncompliance with medications.  -Continue Semglee 15 units twice daily -Continue SSI-moderate -This is way lower than here insulin dose at home suggesting poor dietary and medication noncompliance   Uncontrolled hypertension: BP elevated but improved. -Continue Toprol-XL 25 mg daily.  Decreased due to bradycardia -Continue home amlodipine and hydralazine. -Increase losartan from 25 to 50 mg daily.  Started in hospital. -Discontinued lisinopril and HCTZ. -Further adjustment as appropriate.  Avoid hypotension.   Hyperlipidemia: LDL 123 despite maximum statin and Zetia.  Suspect  noncompliance. -Continue Lipitor and Zetia -Referral to lipid clinic ordered.   Dizziness: Likely due to #1.  Resolved   Obesity: Body mass index is 30.73 kg/m.            Vital signs Vitals:   06/03/22 1045 06/03/22 1350 06/03/22 1945 06/04/22 0559  BP: (!) 169/78 (!) 163/84 (!) 154/73 (!) 159/72  Pulse: 65  (!) 53 (!) 52  Temp: 98.6 F (37 C)  97.9 F (36.6 C) 97.9 F (36.6 C)  Resp:   18 18  Height:      Weight:      SpO2: 100%  95% 94%  TempSrc: Oral  Oral Oral  BMI (Calculated):         Discharge exam  GENERAL: No apparent distress.  Nontoxic. HEENT: MMM.  Vision and hearing grossly intact.  NECK: Supple.  No apparent JVD.  RESP:  No IWOB.  Fair aeration bilaterally. CVS: HR in 26s.  Regular rhythm. Heart sounds normal.  ABD/GI/GU: BS+. Abd soft, NTND.  MSK/EXT:  Moves extremities.  Left hemiparesis. SKIN: no apparent skin lesion or wound NEURO: Awake and alert. Oriented appropriately.  Left hemiparesis, 3/5 in LUE and LLE PSYCH: Calm. Normal affect.   Discharge Instructions Discharge Instructions     AMB Referral to Advanced Lipid Disorders Clinic   Complete by: As directed    Internal Lipid Clinic Referral Scheduling  Internal lipid clinic referrals are providers within [facility], who wish to refer established patients for routine management (help in starting PCSK9 inhibitor therapy) or advanced therapies.  Internal MD referral criteria:              1. All patients with LDL>190 mg/dL  2. All patients with Triglycerides >500 mg/dL  3. Patients with suspected or confirmed heterozygous familial hyperlipidemia (HeFH) or homozygous familial hyperlipidemia (HoFH)  4. Patients with family history of suspicious for genetic dyslipidemia desiring genetic testing  5. Patients refractory to standard guideline based therapy  6. Patients with statin intolerance (failed 2 statins, one of which must be a high potency statin)  7. Patients who the provider desires to  be seen by MD   Internal PharmD referral criteria:   1. Follow-up patients for medication management  2. Follow-up for compliance monitoring  3. Patients for drug education  4. Patients with statin intolerance  5. PCSK9 inhibitor education and prior authorization approvals  6. Patients with triglycerides <500 mg/dL  External Lipid Clinic Referral  External lipid clinic referrals are for providers outside of [facility], considered new clinic patients - automatically routed to MD schedule   Diet - low sodium heart healthy   Complete by: As directed    Diet Carb Modified   Complete by: As directed    Increase activity slowly   Complete by: As directed       Allergies as of 06/04/2022   No Known Allergies      Medication List     STOP taking these medications    gabapentin 300 MG capsule Commonly known as: Neurontin   HumaLOG KwikPen 200 UNIT/ML KwikPen Generic drug: insulin lispro   insulin glargine 100 UNIT/ML Solostar Pen Commonly known as: LANTUS   lisinopril-hydrochlorothiazide 20-12.5 MG tablet Commonly known as: ZESTORETIC       TAKE these medications    amLODipine 10 MG tablet Commonly known as: NORVASC Take 1 tablet by mouth once daily   atorvastatin 80 MG tablet Commonly known as: Lipitor Take 1 tablet (80 mg total) by mouth  daily.   Blood Pressure Monitor Automat Devi 1 Device by Does not apply route daily.   cilostazol 50 MG tablet Commonly known as: PLETAL Take 1 tablet by mouth twice daily   clopidogrel 75 MG tablet Commonly known as: PLAVIX Take 1 tablet (75 mg total) by mouth daily.   ezetimibe 10 MG tablet Commonly known as: ZETIA Take 1 tablet (10 mg total) by mouth daily.   FLUoxetine 20 MG capsule Commonly known as: PROZAC Take 1 capsule by mouth once daily   glucose blood test strip Commonly known as: OneTouch Verio USE TO CHECK BLOOD SUGAR TWICE A DAY AND PRN   glucose blood test strip Commonly known as: Contour  Next Test 1 each by Other route 2 (two) times daily. And lancets 2/day   hydrALAZINE 50 MG tablet Commonly known as: APRESOLINE TAKE 1 TABLET BY MOUTH THREE TIMES DAILY   insulin aspart 100 UNIT/ML injection Commonly known as: novoLOG Inject 0-15 Units into the skin 4 (four) times daily - after meals and at bedtime. CBG 70 - 120: 0 units CBG 121 - 150: 2 units CBG 151 - 200: 3 units CBG 201 - 250: 5 units CBG 251 - 300: 8 units CBG 301 - 350: 11 units CBG 351 - 400: 15 units CBG > 400: call MD and obtain STAT lab verification   insulin glargine-yfgn 100 UNIT/ML injection Commonly known as: SEMGLEE Inject 0.15 mLs (15 Units total) into the skin 2 (two) times daily.   losartan 50 MG tablet Commonly known as: COZAAR Take 1 tablet (50 mg total) by mouth daily.   metoprolol succinate 25 MG 24 hr tablet Commonly known as: TOPROL-XL Take 1 tablet (25 mg total) by mouth daily. What changed:  medication strength See the new instructions.   ONE TOUCH LANCETS Misc USE TO CHECK BLOOD SUGAR TWICE A DAY AND PRN   pantoprazole 40 MG tablet Commonly known as: PROTONIX Take 1 tablet (40 mg total) by mouth daily.   Trulicity 8.33 AS/5.0NL Sopn Generic drug: Dulaglutide Inject 0.75 mg into the skin once a week.   Vitamin B Complex Tabs Take 1 tablet by mouth every morning.   VITAMIN D3 PO Take 1 capsule by mouth every morning.        Consultations: Neurology  Procedures/Studies:   ECHOCARDIOGRAM COMPLETE  Result Date: 05/31/2022    ECHOCARDIOGRAM REPORT   Patient Name:   TELESIA ATES Date of Exam: 05/31/2022 Medical Rec #:  [id number]       Height:       64.0 in Accession #:    [id number]      Weight:       179.0 lb Date of Birth:  [birth year]       BSA:          1.866 m Patient Age:    35 years        BP:           168/93 mmHg Patient Gender: F               HR:           72 bpm. Exam Location:  Inpatient Procedure: 2D Echo, Cardiac Doppler and Color Doppler Indications:     Stroke  History:        Patient has prior history of Echocardiogram examinations.                 Stroke; Risk Factors:Hypertension, Diabetes and Dyslipidemia.  Sonographer:  Phineas Douglas Referring Phys: [id number] AMRIT ADHIKARI IMPRESSIONS  1. Left ventricular ejection fraction, by estimation, is 60 to 65%. The left ventricle has normal function. The left ventricle has no regional wall motion abnormalities. There is mild concentric left ventricular hypertrophy. Left ventricular diastolic parameters are consistent with Grade I diastolic dysfunction (impaired relaxation).  2. Right ventricular systolic function is normal. The right ventricular size is normal. Tricuspid regurgitation signal is inadequate for assessing PA pressure.  3. The mitral valve is grossly normal. No evidence of mitral valve regurgitation. No evidence of mitral stenosis.  4. The aortic valve is tricuspid. Aortic valve regurgitation is not visualized. No aortic stenosis is present.  5. The inferior vena cava is normal in size with greater than 50% respiratory variability, suggesting right atrial pressure of 3 mmHg. Conclusion(s)/Recommendation(s): No intracardiac source of embolism detected on this transthoracic study. Consider a transesophageal echocardiogram to exclude cardiac source of embolism if clinically indicated. FINDINGS  Left Ventricle: Left ventricular ejection fraction, by estimation, is 60 to 65%. The left ventricle has normal function. The left ventricle has no regional wall motion abnormalities. The left ventricular internal cavity size was normal in size. There is  mild concentric left ventricular hypertrophy. Left ventricular diastolic parameters are consistent with Grade I diastolic dysfunction (impaired relaxation). Right Ventricle: The right ventricular size is normal. No increase in right ventricular wall thickness. Right ventricular systolic function is normal. Tricuspid regurgitation signal is inadequate for assessing PA  pressure. Left Atrium: Left atrial size was normal in size. Right Atrium: Right atrial size was normal in size. Pericardium: Trivial pericardial effusion is present. Mitral Valve: The mitral valve is grossly normal. No evidence of mitral valve regurgitation. No evidence of mitral valve stenosis. Tricuspid Valve: The tricuspid valve is grossly normal. Tricuspid valve regurgitation is trivial. No evidence of tricuspid stenosis. Aortic Valve: The aortic valve is tricuspid. Aortic valve regurgitation is not visualized. No aortic stenosis is present. Pulmonic Valve: The pulmonic valve was grossly normal. Pulmonic valve regurgitation is trivial. No evidence of pulmonic stenosis. Aorta: The aortic root and ascending aorta are structurally normal, with no evidence of dilitation. Venous: The inferior vena cava is normal in size with greater than 50% respiratory variability, suggesting right atrial pressure of 3 mmHg. IAS/Shunts: The atrial septum is grossly normal.  LEFT VENTRICLE PLAX 2D LVIDd:         3.60 cm      Diastology LVIDs:         2.30 cm      LV e' medial:    5.68 cm/s LV PW:         1.30 cm      LV E/e' medial:  12.4 LV IVS:        1.20 cm      LV e' lateral:   7.22 cm/s LVOT diam:     2.00 cm      LV E/e' lateral: 9.8 LV SV:         69 LV SV Index:   37 LVOT Area:     3.14 cm  LV Volumes (MOD) LV vol d, MOD A2C: 119.0 ml LV vol d, MOD A4C: 111.0 ml LV vol s, MOD A2C: 44.0 ml LV vol s, MOD A4C: 48.0 ml LV SV MOD A2C:     75.0 ml LV SV MOD A4C:     111.0 ml LV SV MOD BP:      68.6 ml RIGHT VENTRICLE  IVC RV Basal diam:  3.10 cm     IVC diam: 1.40 cm RV S prime:     13.90 cm/s TAPSE (M-mode): 1.8 cm LEFT ATRIUM             Index        RIGHT ATRIUM           Index LA diam:        3.70 cm 1.98 cm/m   RA Area:     12.00 cm LA Vol (A2C):   51.4 ml 27.54 ml/m  RA Volume:   23.00 ml  12.32 ml/m LA Vol (A4C):   56.6 ml 30.33 ml/m LA Biplane Vol: 58.1 ml 31.13 ml/m  AORTIC VALVE LVOT Vmax:   93.20 cm/s  LVOT Vmean:  62.800 cm/s LVOT VTI:    0.220 m  AORTA Ao Root diam: 3.10 cm Ao Asc diam:  3.20 cm MITRAL VALVE MV Area (PHT): 2.77 cm    SHUNTS MV Decel Time: 274 msec    Systemic VTI:  0.22 m MV E velocity: 70.70 cm/s  Systemic Diam: 2.00 cm MV A velocity: 95.50 cm/s MV E/A ratio:  0.74 Eleonore Chiquito MD Electronically signed by Eleonore Chiquito MD Signature Date/Time: 05/31/2022/9:44:38 AM    Final    CT HEAD WO CONTRAST (5MM)  Result Date: 05/30/2022 CLINICAL DATA:  Follow-up stroke EXAM: CT HEAD WITHOUT CONTRAST TECHNIQUE: Contiguous axial images were obtained from the base of the skull through the vertex without intravenous contrast. RADIATION DOSE REDUCTION: This exam was performed according to the departmental dose-optimization program which includes automated exposure control, adjustment of the mA and/or kV according to patient size and/or use of iterative reconstruction technique. COMPARISON:  MRI brain dated 05/30/2022 at 1031 hours FINDINGS: Brain: No evidence of acute infarction, hemorrhage, hydrocephalus, extra-axial collection or mass lesion/mass effect. Known tiny acute infarct within the right medulla is not evident on current CT. Mild subcortical white matter and periventricular small vessel ischemic changes. Vascular: Intracranial atherosclerosis. Skull: Normal. Negative for fracture or focal lesion. Sinuses/Orbits: The visualized paranasal sinuses are essentially clear. The mastoid air cells are unopacified. Other: None. IMPRESSION: Known tiny acute infarct within the right medulla is not evident on current CT. Mild small vessel ischemic changes. Electronically Signed   By: Julian Hy M.D.   On: 05/30/2022 23:55   MR ANGIO HEAD WO CONTRAST  Result Date: 05/30/2022 CLINICAL DATA:  History of non-Hodgkin's lymphoma, hypertension, Guillain-Barre syndrome, and multiple strokes. Complains of dizziness and overall weakness. Possible CVA. EXAM: MRI HEAD WITHOUT CONTRAST MRA HEAD WITHOUT  CONTRAST MRA NECK WITHOUT AND WITH CONTRAST TECHNIQUE: Multiplanar, multi-echo pulse sequences of the brain and surrounding structures were acquired without intravenous contrast. Angiographic images of the Circle of Willis were acquired using MRA technique without intravenous contrast. Angiographic images of the neck were acquired using MRA technique without and with intravenous contrast. Carotid stenosis measurements (when applicable) are obtained utilizing NASCET criteria, using the distal internal carotid diameter as the denominator. CONTRAST:  77m GADAVIST GADOBUTROL 1 MMOL/ML IV SOLN COMPARISON:  None Available. CT head 1 day prior, CTA head/neck 09/15/2021 FINDINGS: MRI HEAD FINDINGS Brain: There is diffusion restriction in the right aspect of the medulla with faint associated FLAIR signal abnormality consistent with acute infarct (5-9). There is no associated hemorrhage or mass effect. There is no other evidence of acute infarct. There is no acute intracranial hemorrhage or extra-axial fluid collection. Parenchymal volume is normal. The ventricles are normal in size. Scattered foci of  FLAIR signal abnormality in the supratentorial white matter are nonspecific but likely reflects sequela of underlying chronic small-vessel ischemic change, similar to the MRI from 2023. There are small remote infarcts in the bilateral cerebellar hemispheres, at least 1 of which in the left cerebellar hemisphere is new since 09/15/2021. There are punctate chronic microhemorrhages in the right thalamus and right parietal white matter, nonspecific but possibly hypertensive. The pituitary and suprasellar region are normal. There is no mass lesion. There is no mass effect or midline shift. Vascular: See below. Skull and upper cervical spine: Normal marrow signal. Sinuses/Orbits: The paranasal sinuses are clear. The globes and orbits are unremarkable. Other: There are prominent lymph nodes in the imaged upper neck on the coronal  diffusion sequence which appear overall decreased in size since the prior MRI from 2023 though are incompletely evaluated. MRA HEAD FINDINGS Anterior circulation: There is atherosclerotic irregularity of the carotid siphons resulting in moderate stenosis of the cavernous segment on the left and mild-to-moderate stenosis of the supraclinoid segment on the right. The left M1 segment is patent. There is moderate to severe stenosis of the origin of the superior left M2 division (7-156). The distal left MCA branches appear patent. There is severe stenosis of the distal right M1 segment (1036-3). There is multifocal moderate narrowing of the proximal right superior M2 division. The distal right MCA branches otherwise appear patent. The bilateral ACAs are patent, without proximal stenosis or occlusion. The anterior communicating artery is not definitely identified. There is no aneurysm or AVM. Posterior circulation: There is no flow related enhancement of the right V4 segment on the time-of-flight MRA head. There is intermittent enhancement on the postcontrast MRA neck images. Findings are similar to the CTA from 2023. The PICA origin is not seen on the right. There is multifocal severe stenosis/near occlusion of the left V4 segment, also similar to the prior CT. PICA is identified on the left. The basilar artery is patent with atherosclerotic irregularity and narrowing. There is severe stenosis of the origin of the left P1 segment. There is multifocal atherosclerotic irregularity and narrowing in the remainder of the left PCA without other high-grade stenosis or occlusion. The right PCA is patent with mild atherosclerotic irregularity but no high-grade stenosis or occlusion. Findings are similar to the prior CTA. There is no aneurysm or AVM. Anatomic variants: None. MRA NECK FINDINGS Aortic arch: The imaged aortic arch is normal. The origins of the major branch vessels are patent. The subclavian arteries are patent to the  level imaged. 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 Right carotid system: The right common, internal, and external carotid arteries are patent, with mild irregularity at the bifurcation but no hemodynamically significant stenosis or occlusion. There is no evidence of dissection or aneurysm. Left carotid system: The left common, internal, and external carotid arteries are patent, without significant stenosis or occlusion. There is no evidence of dissection or aneurysm. Vertebral arteries: The right vertebral artery appears patent at its origin. There is short-segment high-grade stenosis/occlusion of the distal V2 segment (15-60). There is diminutive reconstitution with further occlusion of the V3 segment. There is moderate stenosis of the left vertebral artery origin (15-52). The left vertebral artery is otherwise patent in the neck without other hemodynamically significant stenosis or occlusion. There is no evidence of dissection or aneurysm. There is antegrade flow in both vertebral arteries. Other: None IMPRESSION: MR HEAD 1. Small acute infarct in the right aspect of the medulla without hemorrhage or mass  effect. 2. Background chronic small-vessel ischemic change with small remote infarcts in the bilateral cerebellar hemispheres, one of which on the left is new since 09/15/2021. 3. Prominent lymph nodes in the upper neck consistent with the history of lymphoma, overall decreased in size since the prior studies from 09/15/2021 but incompletely evaluated. MRA HEAD 1. Diminutive enhancement of the right V4 segment with intermittent occlusion, and multifocal severe stenosis/near occlusion of the left V4 segment, similar to the prior CTA. 2. Additional intracranial atherosclerotic disease as detailed above resulting in moderate stenosis of the left cavernous ICA, mild-to-moderate stenosis of the right supraclinoid ICA, severe stenosis of the origin of the left superior M2 division, severe  stenosis of the distal right M1 segment, and severe stenosis of the proximal left P1 segment. MRA NECK 1. 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 2. Moderate stenosis of the origin of the left vertebral artery, not significantly changed. 3. Patent carotid systems bilaterally. Electronically Signed   By: Valetta Mole M.D.   On: 05/30/2022 12:01   MR BRAIN WO CONTRAST  Result Date: 05/30/2022 CLINICAL DATA:  History of non-Hodgkin's lymphoma, hypertension, Guillain-Barre syndrome, and multiple strokes. Complains of dizziness and overall weakness. Possible CVA. EXAM: MRI HEAD WITHOUT CONTRAST MRA HEAD WITHOUT CONTRAST MRA NECK WITHOUT AND WITH CONTRAST TECHNIQUE: Multiplanar, multi-echo pulse sequences of the brain and surrounding structures were acquired without intravenous contrast. Angiographic images of the Circle of Willis were acquired using MRA technique without intravenous contrast. Angiographic images of the neck were acquired using MRA technique without and with intravenous contrast. Carotid stenosis measurements (when applicable) are obtained utilizing NASCET criteria, using the distal internal carotid diameter as the denominator. CONTRAST:  62m GADAVIST GADOBUTROL 1 MMOL/ML IV SOLN COMPARISON:  None Available. CT head 1 day prior, CTA head/neck 09/15/2021 FINDINGS: MRI HEAD FINDINGS Brain: There is diffusion restriction in the right aspect of the medulla with faint associated FLAIR signal abnormality consistent with acute infarct (5-9). There is no associated hemorrhage or mass effect. There is no other evidence of acute infarct. There is no acute intracranial hemorrhage or extra-axial fluid collection. Parenchymal volume is normal. The ventricles are normal in size. Scattered foci of FLAIR signal abnormality in the supratentorial white matter are nonspecific but likely reflects sequela of underlying chronic small-vessel ischemic change, similar to the MRI  from 2023. There are small remote infarcts in the bilateral cerebellar hemispheres, at least 1 of which in the left cerebellar hemisphere is new since 09/15/2021. There are punctate chronic microhemorrhages in the right thalamus and right parietal white matter, nonspecific but possibly hypertensive. The pituitary and suprasellar region are normal. There is no mass lesion. There is no mass effect or midline shift. Vascular: See below. Skull and upper cervical spine: Normal marrow signal. Sinuses/Orbits: The paranasal sinuses are clear. The globes and orbits are unremarkable. Other: There are prominent lymph nodes in the imaged upper neck on the coronal diffusion sequence which appear overall decreased in size since the prior MRI from 2023 though are incompletely evaluated. MRA HEAD FINDINGS Anterior circulation: There is atherosclerotic irregularity of the carotid siphons resulting in moderate stenosis of the cavernous segment on the left and mild-to-moderate stenosis of the supraclinoid segment on the right. The left M1 segment is patent. There is moderate to severe stenosis of the origin of the superior left M2 division (7-156). The distal left MCA branches appear patent. There is severe stenosis of the distal right M1 segment (1036-3).  There is multifocal moderate narrowing of the proximal right superior M2 division. The distal right MCA branches otherwise appear patent. The bilateral ACAs are patent, without proximal stenosis or occlusion. The anterior communicating artery is not definitely identified. There is no aneurysm or AVM. Posterior circulation: There is no flow related enhancement of the right V4 segment on the time-of-flight MRA head. There is intermittent enhancement on the postcontrast MRA neck images. Findings are similar to the CTA from 2023. The PICA origin is not seen on the right. There is multifocal severe stenosis/near occlusion of the left V4 segment, also similar to the prior CT. PICA is  identified on the left. The basilar artery is patent with atherosclerotic irregularity and narrowing. There is severe stenosis of the origin of the left P1 segment. There is multifocal atherosclerotic irregularity and narrowing in the remainder of the left PCA without other high-grade stenosis or occlusion. The right PCA is patent with mild atherosclerotic irregularity but no high-grade stenosis or occlusion. Findings are similar to the prior CTA. There is no aneurysm or AVM. Anatomic variants: None. MRA NECK FINDINGS Aortic arch: The imaged aortic arch is normal. The origins of the major branch vessels are patent. The subclavian arteries are patent to the level imaged. 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 Right carotid system: The right common, internal, and external carotid arteries are patent, with mild irregularity at the bifurcation but no hemodynamically significant stenosis or occlusion. There is no evidence of dissection or aneurysm. Left carotid system: The left common, internal, and external carotid arteries are patent, without significant stenosis or occlusion. There is no evidence of dissection or aneurysm. Vertebral arteries: The right vertebral artery appears patent at its origin. There is short-segment high-grade stenosis/occlusion of the distal V2 segment (15-60). There is diminutive reconstitution with further occlusion of the V3 segment. There is moderate stenosis of the left vertebral artery origin (15-52). The left vertebral artery is otherwise patent in the neck without other hemodynamically significant stenosis or occlusion. There is no evidence of dissection or aneurysm. There is antegrade flow in both vertebral arteries. Other: None IMPRESSION: MR HEAD 1. Small acute infarct in the right aspect of the medulla without hemorrhage or mass effect. 2. Background chronic small-vessel ischemic change with small remote infarcts in the bilateral cerebellar hemispheres, one  of which on the left is new since 09/15/2021. 3. Prominent lymph nodes in the upper neck consistent with the history of lymphoma, overall decreased in size since the prior studies from 09/15/2021 but incompletely evaluated. MRA HEAD 1. Diminutive enhancement of the right V4 segment with intermittent occlusion, and multifocal severe stenosis/near occlusion of the left V4 segment, similar to the prior CTA. 2. Additional intracranial atherosclerotic disease as detailed above resulting in moderate stenosis of the left cavernous ICA, mild-to-moderate stenosis of the right supraclinoid ICA, severe stenosis of the origin of the left superior M2 division, severe stenosis of the distal right M1 segment, and severe stenosis of the proximal left P1 segment. MRA NECK 1. 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 2. Moderate stenosis of the origin of the left vertebral artery, not significantly changed. 3. Patent carotid systems bilaterally. Electronically Signed   By: Valetta Mole M.D.   On: 05/30/2022 12:01   MR ANGIO NECK W WO CONTRAST  Result Date: 05/30/2022 CLINICAL DATA:  History of non-Hodgkin's lymphoma, hypertension, Guillain-Barre syndrome, and multiple strokes. Complains of dizziness and overall weakness. Possible CVA. EXAM:  MRI HEAD WITHOUT CONTRAST MRA HEAD WITHOUT CONTRAST MRA NECK WITHOUT AND WITH CONTRAST TECHNIQUE: Multiplanar, multi-echo pulse sequences of the brain and surrounding structures were acquired without intravenous contrast. Angiographic images of the Circle of Willis were acquired using MRA technique without intravenous contrast. Angiographic images of the neck were acquired using MRA technique without and with intravenous contrast. Carotid stenosis measurements (when applicable) are obtained utilizing NASCET criteria, using the distal internal carotid diameter as the denominator. CONTRAST:  64m GADAVIST GADOBUTROL 1 MMOL/ML IV SOLN COMPARISON:  None  Available. CT head 1 day prior, CTA head/neck 09/15/2021 FINDINGS: MRI HEAD FINDINGS Brain: There is diffusion restriction in the right aspect of the medulla with faint associated FLAIR signal abnormality consistent with acute infarct (5-9). There is no associated hemorrhage or mass effect. There is no other evidence of acute infarct. There is no acute intracranial hemorrhage or extra-axial fluid collection. Parenchymal volume is normal. The ventricles are normal in size. Scattered foci of FLAIR signal abnormality in the supratentorial white matter are nonspecific but likely reflects sequela of underlying chronic small-vessel ischemic change, similar to the MRI from 2023. There are small remote infarcts in the bilateral cerebellar hemispheres, at least 1 of which in the left cerebellar hemisphere is new since 09/15/2021. There are punctate chronic microhemorrhages in the right thalamus and right parietal white matter, nonspecific but possibly hypertensive. The pituitary and suprasellar region are normal. There is no mass lesion. There is no mass effect or midline shift. Vascular: See below. Skull and upper cervical spine: Normal marrow signal. Sinuses/Orbits: The paranasal sinuses are clear. The globes and orbits are unremarkable. Other: There are prominent lymph nodes in the imaged upper neck on the coronal diffusion sequence which appear overall decreased in size since the prior MRI from 2023 though are incompletely evaluated. MRA HEAD FINDINGS Anterior circulation: There is atherosclerotic irregularity of the carotid siphons resulting in moderate stenosis of the cavernous segment on the left and mild-to-moderate stenosis of the supraclinoid segment on the right. The left M1 segment is patent. There is moderate to severe stenosis of the origin of the superior left M2 division (7-156). The distal left MCA branches appear patent. There is severe stenosis of the distal right M1 segment (1036-3). There is multifocal  moderate narrowing of the proximal right superior M2 division. The distal right MCA branches otherwise appear patent. The bilateral ACAs are patent, without proximal stenosis or occlusion. The anterior communicating artery is not definitely identified. There is no aneurysm or AVM. Posterior circulation: There is no flow related enhancement of the right V4 segment on the time-of-flight MRA head. There is intermittent enhancement on the postcontrast MRA neck images. Findings are similar to the CTA from 2023. The PICA origin is not seen on the right. There is multifocal severe stenosis/near occlusion of the left V4 segment, also similar to the prior CT. PICA is identified on the left. The basilar artery is patent with atherosclerotic irregularity and narrowing. There is severe stenosis of the origin of the left P1 segment. There is multifocal atherosclerotic irregularity and narrowing in the remainder of the left PCA without other high-grade stenosis or occlusion. The right PCA is patent with mild atherosclerotic irregularity but no high-grade stenosis or occlusion. Findings are similar to the prior CTA. There is no aneurysm or AVM. Anatomic variants: None. MRA NECK FINDINGS Aortic arch: The imaged aortic arch is normal. The origins of the major branch vessels are patent. The subclavian arteries are patent to the level imaged. Incidental  note is made of an aberrant right subclavian artery. Right carotid system: The right common, internal, and external carotid arteries are patent, with mild irregularity at the bifurcation but no hemodynamically significant stenosis or occlusion. There is no evidence of dissection or aneurysm. Left carotid system: The left common, internal, and external carotid arteries are patent, without significant stenosis or occlusion. There is no evidence of dissection or aneurysm. Vertebral arteries: The right vertebral artery appears patent at its origin. There is short-segment high-grade  stenosis/occlusion of the distal V2 segment (15-60). There is diminutive reconstitution with further occlusion of the V3 segment. There is moderate stenosis of the left vertebral artery origin (15-52). The left vertebral artery is otherwise patent in the neck without other hemodynamically significant stenosis or occlusion. There is no evidence of dissection or aneurysm. There is antegrade flow in both vertebral arteries. Other: None IMPRESSION: MR HEAD 1. Small acute infarct in the right aspect of the medulla without hemorrhage or mass effect. 2. Background chronic small-vessel ischemic change with small remote infarcts in the bilateral cerebellar hemispheres, one of which on the left is new since 09/15/2021. 3. Prominent lymph nodes in the upper neck consistent with the history of lymphoma, overall decreased in size since the prior studies from 09/15/2021 but incompletely evaluated. MRA HEAD 1. Diminutive enhancement of the right V4 segment with intermittent occlusion, and multifocal severe stenosis/near occlusion of the left V4 segment, similar to the prior CTA. 2. Additional intracranial atherosclerotic disease as detailed above resulting in moderate stenosis of the left cavernous ICA, mild-to-moderate stenosis of the right supraclinoid ICA, severe stenosis of the origin of the left superior M2 division, severe stenosis of the distal right M1 segment, and severe stenosis of the proximal left P1 segment. MRA NECK 1. 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 2. Moderate stenosis of the origin of the left vertebral artery, not significantly changed. 3. Patent carotid systems bilaterally. Electronically Signed   By: Valetta Mole M.D.   On: 05/30/2022 12:01   CT Angio Chest/Abd/Pel for Dissection W and/or W/WO  Result Date: 05/29/2022 CLINICAL DATA:  numbness, tingling ,dizziness, hypertensive Radiologic records indicates history of CLL. EXAM: CT ANGIOGRAPHY CHEST,  ABDOMEN AND PELVIS TECHNIQUE: Non-contrast CT of the chest was initially obtained. Multidetector CT imaging through the chest, abdomen and pelvis was performed using the standard protocol during bolus administration of intravenous contrast. Multiplanar reconstructed images and MIPs were obtained and reviewed to evaluate the vascular anatomy. RADIATION DOSE REDUCTION: This exam was performed according to the departmental dose-optimization program which includes automated exposure control, adjustment of the mA and/or kV according to patient size and/or use of iterative reconstruction technique. CONTRAST:  25m OMNIPAQUE IOHEXOL 350 MG/ML SOLN COMPARISON:  Chest radiograph earlier today. Chest, abdomen, pelvis CT 10/15/2019 FINDINGS: CTA CHEST FINDINGS Cardiovascular: No aortic hematoma on this unenhanced exam. Thoracic aorta is normal in caliber with mild tortuosity. Mild aortic atherosclerosis. No dissection, aneurysm or acute aortic findings. Aberrant right subclavian artery. No central pulmonary embolus on this exam not tailored to pulmonary artery assessment. Borderline cardiomegaly. Minimal pericardial effusion. Mediastinum/Nodes: No enlarged mediastinal or hilar lymph nodes. There is bulky bilateral axillary adenopathy. Index right axillary node measures 2.1 cm short axis series 4, image 21. Left axillary node measures 2.5 cm short axis series 4, image 33. 11 mm left supraclavicular node is stable. Similar appearance of heterogeneous right lobe of the thyroid. No esophageal wall thickening. Lungs/Pleura: Minor atelectasis in the lingula.  No other focal airspace disease. No pleural fluid. No features of pulmonary edema. No pulmonary mass or nodule. The trachea and central airways are patent. Musculoskeletal: Heterogeneous appearance of the osseous structures but no discrete focal lesion. No acute osseous findings. Review of the MIP images confirms the above findings. CTA ABDOMEN AND PELVIS FINDINGS VASCULAR  Aorta: Moderate atherosclerosis. Normal in caliber without aneurysm, dissection, vasculitis or significant stenosis. Celiac: Mild plaque at the origin with minimal stenosis. Branch vessels are patent. No dissection or acute findings. SMA: Patent without evidence of aneurysm, dissection, vasculitis or significant stenosis. Renals: Single bilateral renal arteries. Mild calcified plaque at the origin of the both renal arteries with minimal stenosis. No dissection or acute findings. IMA: Patent without evidence of aneurysm, dissection, vasculitis or significant stenosis. Inflow: Moderate atherosclerosis. No aneurysm, dissection or acute findings. Veins: No obvious venous abnormality within the limitations of this arterial phase study. Review of the MIP images confirms the above findings. NON-VASCULAR Hepatobiliary: No focal hepatic abnormality on this arterial phase exam. Or gallbladder Pancreas: No ductal dilatation or inflammation. Spleen: Normal in size and arterial enhancement. Adrenals/Urinary Tract: No adrenal nodule. Heterogeneous enhancement of both kidneys with mild perinephric edema. Loss of corticomedullary differentiation on the right. No frank hydronephrosis. 4.3 cm mildly complex cyst arising from the lower pole of the left kidney, suboptimally assessed on this arterial phase exam. Thin internal septations are seen. Mild bladder distension but no wall thickening. Stomach/Bowel: No bowel obstruction or inflammation. Scattered colonic diverticula without diverticulitis. Normal appendix. Lymphatic: Retroperitoneal adenopathy with progression from prior exam. Dominant node is at the right common iliac station measuring 2.9 cm short axis series 4, image 143. This node previously measured 2.5 cm. Lymph node at the level of the upper right kidney measures 18 mm short axis series 4, image 88. Anterior periaortic node measures 2.1 cm series 4, image 103. There also enlarged peripancreatic and periportal nodes.  Mesenteric adenopathy is suboptimally assessed on this arterial phase exam, but mesenteric lymph nodes have increased in size from prior. There also ileocolic nodes are enlarged. Bilateral pelvic adenopathy, 17 mm left external iliac node series 4, image 167, 14 mm right external iliac node series 4, image 166. Reproductive: Status post hysterectomy. No adnexal masses. Other: No ascites or free air. Small fat containing umbilical hernia. Musculoskeletal: The bones are diffusely heterogeneous. No acute osseous finding. No discrete osseous lesion. Bilateral hip osteoarthritis. Review of the MIP images confirms the above findings. IMPRESSION: 1. No aortic dissection or acute aortic abnormality. Moderate aortic atherosclerosis. 2. Bulky bilateral axillary, mesenteric, and retroperitoneal adenopathy, consistent with history of CLL. Adenopathy is progressed from 2021 exam. 3. Heterogeneous enhancement of both kidneys with mild perinephric edema, suspicious for pyelonephritis. Recommend correlation with urinalysis. 4. Colonic diverticulosis without diverticulitis. Aortic Atherosclerosis (ICD10-I70.0). Electronically Signed   By: Keith Rake M.D.   On: 05/29/2022 15:17   DG Chest 2 View  Result Date: 05/29/2022 CLINICAL DATA:  Dizziness. EXAM: CHEST - 2 VIEW COMPARISON:  Chest CT dated 10/15/2019 FINDINGS: Stable enlarged heart and tortuous and calcified thoracic aorta. Mild linear scarring in the lingula without significant change. Otherwise, clear lungs with normal vascularity. Mild thoracic spine degenerative changes. IMPRESSION: No acute abnormality. Stable cardiomegaly. Electronically Signed   By: Claudie Revering M.D.   On: 05/29/2022 12:29   CT Head Wo Contrast  Result Date: 05/29/2022 CLINICAL DATA:  Dizziness.  Numbness.  TIAs. EXAM: CT HEAD WITHOUT CONTRAST TECHNIQUE: Contiguous axial images were obtained from the base  of the skull through the vertex without intravenous contrast. RADIATION DOSE REDUCTION:  This exam was performed according to the departmental dose-optimization program which includes automated exposure control, adjustment of the mA and/or kV according to patient size and/or use of iterative reconstruction technique. COMPARISON:  09/15/2021 FINDINGS: Brain: No evidence of intracranial hemorrhage, acute infarction, hydrocephalus, extra-axial collection, or mass lesion/mass effect. Old lacunar infarct noted in left superior cerebellum. Vascular:  No hyperdense vessel or other acute findings. Skull: No evidence of fracture or other significant bone abnormality. Sinuses/Orbits:  No acute findings. Other: None. IMPRESSION: No acute intracranial abnormality. Electronically Signed   By: Marlaine Hind M.D.   On: 05/29/2022 12:23       The results of significant diagnostics from this hospitalization (including imaging, microbiology, ancillary and laboratory) are listed below for reference.     Microbiology: Recent Results (from the past 240 hour(s))  Resp panel by RT-PCR (RSV, Flu A&B, Covid) Nasopharyngeal Swab     Status: None   Collection Time: 05/29/22 11:21 AM   Specimen: Nasopharyngeal Swab; Nasal Swab  Result Value Ref Range Status   SARS Coronavirus 2 by RT PCR NEGATIVE NEGATIVE Final    Comment: (NOTE) SARS-CoV-2 target nucleic acids are NOT DETECTED.  The SARS-CoV-2 RNA is generally detectable in upper respiratory specimens during the acute phase of infection. The lowest concentration of SARS-CoV-2 viral copies this assay can detect is 138 copies/mL. A negative result does not preclude SARS-Cov-2 infection and should not be used as the sole basis for treatment or other patient management decisions. A negative result may occur with  improper specimen collection/handling, submission of specimen other than nasopharyngeal swab, presence of viral mutation(s) within the areas targeted by this assay, and inadequate number of viral copies(<138 copies/mL). A negative result must be  combined with clinical observations, patient history, and epidemiological information. The expected result is Negative.  Fact Sheet for Patients:  EntrepreneurPulse.com.au  Fact Sheet for Healthcare Providers:  IncredibleEmployment.be  This test is no t yet approved or cleared by the [place] FDA and  has been authorized for detection and/or diagnosis of SARS-CoV-2 by FDA under an Emergency Use Authorization (EUA). This EUA will remain  in effect (meaning this test can be used) for the duration of the COVID-19 declaration under Section 564(b)(1) of the Act, 21 U.S.C.section 360bbb-3(b)(1), unless the authorization is terminated  or revoked sooner.       Influenza A by PCR NEGATIVE NEGATIVE Final   Influenza B by PCR NEGATIVE NEGATIVE Final    Comment: (NOTE) The Xpert Xpress SARS-CoV-2/FLU/RSV plus assay is intended as an aid in the diagnosis of influenza from Nasopharyngeal swab specimens and should not be used as a sole basis for treatment. Nasal washings and aspirates are unacceptable for Xpert Xpress SARS-CoV-2/FLU/RSV testing.  Fact Sheet for Patients: EntrepreneurPulse.com.au  Fact Sheet for Healthcare Providers: IncredibleEmployment.be  This test is not yet approved or cleared by the [place] FDA and has been authorized for detection and/or diagnosis of SARS-CoV-2 by FDA under an Emergency Use Authorization (EUA). This EUA will remain in effect (meaning this test can be used) for the duration of the COVID-19 declaration under Section 564(b)(1) of the Act, 21 U.S.C. section 360bbb-3(b)(1), unless the authorization is terminated or revoked.     Resp Syncytial Virus by PCR NEGATIVE NEGATIVE Final    Comment: (NOTE) Fact Sheet for Patients: EntrepreneurPulse.com.au  Fact Sheet for Healthcare Providers: IncredibleEmployment.be  This test is not yet  approved or cleared by  the [employer] and has been authorized for detection and/or diagnosis of SARS-CoV-2 by FDA under an Emergency Use Authorization (EUA). This EUA will remain in effect (meaning this test can be used) for the duration of the COVID-19 declaration under Section 564(b)(1) of the Act, 21 U.S.C. section 360bbb-3(b)(1), unless the authorization is terminated or revoked.  Performed at [facility], [address] [place], [place] [address]      Labs:  CBC: Recent Labs  Lab 05/29/22 1145 06/02/22 0555  WBC 5.9 6.5  HGB 12.1 11.2*  HCT 36.8 35.8*  MCV 76.8* 80.3  PLT 266 239   BMP &GFR Recent Labs  Lab 05/29/22 1145 05/31/22 0606 06/01/22 0629 06/02/22 0555 06/03/22 0456  NA 136 137 140 140 137  K 3.7 3.7 4.1 3.9 3.5  CL 103 106 109 112* 106  CO2 '25 22 23 22 23  '$ GLUCOSE 196* 203* 183* 114* 91  BUN 22 24* 26* 23 24*  CREATININE 1.28* 1.66* 1.81* 1.32* 1.22*  CALCIUM 9.6 9.4 9.1 9.5 9.5  MG 2.1  --   --  2.1 2.1  PHOS  --   --   --  3.9 3.6   Estimated Creatinine Clearance: 48.7 mL/min (A) (by C-G formula based on SCr of 1.22 mg/dL (H)). Liver & Pancreas: Recent Labs  Lab 06/02/22 0555 06/03/22 0456  ALBUMIN 2.8* 2.7*   No results for input(s): "LIPASE", "AMYLASE" in the last 168 hours. No results for input(s): "AMMONIA" in the last 168 hours. Diabetic: No results for input(s): "HGBA1C" in the last 72 hours. Recent Labs  Lab 06/02/22 2119 06/03/22 0901 06/03/22 1321 06/03/22 1733 06/03/22 2021  GLUCAP 131* 97 236* 188* 170*   Cardiac Enzymes: No results for input(s): "CKTOTAL", "CKMB", "CKMBINDEX", "TROPONINI" in the last 168 hours. No results for input(s): "PROBNP" in the last 8760 hours. Coagulation Profile: No results for input(s): "INR", "PROTIME" in the last 168 hours. Thyroid Function Tests: No results for input(s): "TSH", "T4TOTAL", "FREET4", "T3FREE", "THYROIDAB" in the last 72 hours. Lipid Profile: No  results for input(s): "CHOL", "HDL", "LDLCALC", "TRIG", "CHOLHDL", "LDLDIRECT" in the last 72 hours. Anemia Panel: No results for input(s): "VITAMINB12", "FOLATE", "FERRITIN", "TIBC", "IRON", "RETICCTPCT" in the last 72 hours. Urine analysis:    Component Value Date/Time   COLORURINE YELLOW 05/29/2022 [place] 05/29/2022 1621   LABSPEC 1.015 05/29/2022 1621   PHURINE 6.5 05/29/2022 1621   GLUCOSEU 250 (A) 05/29/2022 1621   HGBUR MODERATE (A) 05/29/2022 1621   HGBUR negative 03/12/2010 0847   BILIRUBINUR NEGATIVE 05/29/2022 1621   BILIRUBINUR n 09/11/2015 1039   KETONESUR NEGATIVE 05/29/2022 1621   PROTEINUR >=300 (A) 05/29/2022 1621   UROBILINOGEN 1.0 09/11/2015 1039   UROBILINOGEN 1.0 05/19/2014 1858   NITRITE NEGATIVE 05/29/2022 1621   LEUKOCYTESUR NEGATIVE 05/29/2022 1621   Sepsis Labs: Invalid input(s): "PROCALCITONIN", "LACTICIDVEN"   SIGNED:  Mercy Riding, MD  [facility] 06/04/2022, 6:47 AM

## 2022-06-04 NOTE — Progress Notes (Signed)
PMR Admission Coordinator Pre-Admission Assessment   Patient: [name] is an 62 y.o., female MRN: [id number] DOB: 1961-01-24 Height: '5\' 4"'$  (162.6 cm) Weight: 81.2 kg   Insurance Information HMO:yes     PPO:  PCP:      IPA:      80/20:      OTHER:  PRIMARY: [employer]      Policy#: [id number]   Subscriber: none CM Name: Kenton Kingfisher: [phone] [id number], N:[phone] Pre-Cert#: [id number]      Employer:  [employer] [place] called 2/2 and approved for 7 days 2/3-2/10, review due to [place], [place]: [phone] [id number], [phone] Eff Date: 01/30/2022- 05/02/2023 Deductible: does not have one OOP Max: $1,800 ($45 met) CIR: 75% coverage, 25% co-insurance SNF: 75% coverage, 25% co-insurance Outpatient: 75% coverage, 25% co-insurance; limited to 30 combined visits/cal yr (30 remaining) Home Health:  75% coverage, 25% co-insurance DME: 75% coverage, 25% co-insurance Providers: in network    SECONDARY: U[employer] Medicaid       Policy#: [id number]     Phone#:    Financial Counselor:       Phone#:    The "Data Collection Information Summary" for patients in Inpatient Rehabilitation Facilities with attached "Privacy Act [place] Records" was provided and verbally reviewed with: Patient and Family   Emergency Contact Information Contact Information       Name Relation Home Work Mobile    [place] Sister [phone]   [phone]    Gala Murdoch Niece     [phone]           Current Medical History  Patient Admitting Diagnosis: CVA  History of Present Illness:  [name] is a 62 year old female with history of T2DM, GBS, diplopia, HTN, 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 She also reported abdominal pain that ws treated with GI cocktail. CTA chest/abdomen/pelvis was negative for dissection, moderate aortic  atherosclerosis, heterogenous enhancement of both kidneys with mild perinephric edema suspicious of pyelo and bulky adenopathy c/w CLL-progressed from 2021 exam.  UA felt to be negative for UTI. 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    Neurology felt that plavix/Pletal adequate and no need to add another anti-platelet. To refer to cardiology lipid clinic for elevated LDL despite statin/Zetia as well as hydroxyzine for nerve/odd sensation in body. BP medications resumed 02/02 and therapy ongoing. Patient noted to have cognitive deficits with mild dysarthria, dizziness with activity, left hemiparesis with left knee instability and fatigue. CIR recommended due to functional decline.      Complete NIHSS TOTAL: 10   Patient's [facility]  has been reviewed by the rehabilitation admission coordinator and physician.   Past Medical History      Past Medical History:  Diagnosis Date   Abnormality of gait 05/10/2010   BACK PAIN 11/14/2008   Class 1 obesity due to excess calories with body mass index (BMI) of 31.0 to 31.9 in adult 02/07/2021   DIABETES MELLITUS, TYPE II 07/15/2008   Diplopia 07/15/2008   ECZEMA, ATOPIC 04/03/2009   GERD (gastroesophageal reflux disease)     Guillain-Barre ([place]) 1988   HYPERLIPIDEMIA 03/06/2009   HYPERTENSION 07/15/2008   Stroke ([place]) 2010, 2011    x2    Vertebral  artery stenosis        Has the patient had major surgery during 100 days prior to admission? No   Family History   family history includes Asthma in an other family member; Cancer in her father; Diabetes in her mother and sister; Hypertension in an other family member; Stroke in her mother and another family member.   Current Medications   Current Facility-Administered Medications:    0.9 %  sodium chloride  infusion, , Intravenous, Continuous, Adhikari, Amrit, MD, Last Rate: 75 mL/hr at 06/01/22 0758, Infusion Verify at 06/01/22 0758   acetaminophen (TYLENOL) tablet 650 mg, 650 mg, Oral, Q4H PRN **OR** acetaminophen (TYLENOL) 160 MG/5ML solution 650 mg, 650 mg, Per Tube, Q4H PRN **OR** acetaminophen (TYLENOL) suppository 650 mg, 650 mg, Rectal, Q4H PRN, Tu, Ching T, DO   ALPRAZolam (XANAX) tablet 0.25 mg, 0.25 mg, Oral, TID PRN, Shelly Coss, MD, 0.25 mg at 05/30/22 2026   atorvastatin (LIPITOR) tablet 80 mg, 80 mg, Oral, Daily, Tu, Ching T, DO, 80 mg at 05/31/22 1011   cilostazol (PLETAL) tablet 50 mg, 50 mg, Oral, BID, Tu, Ching T, DO, 50 mg at 05/31/22 2148   clopidogrel (PLAVIX) tablet 75 mg, 75 mg, Oral, Daily, Tu, Ching T, DO, 75 mg at 05/31/22 1011   enoxaparin (LOVENOX) injection 40 mg, 40 mg, Subcutaneous, Q24H, Tu, Ching T, DO, 40 mg at 05/31/22 1011   ezetimibe (ZETIA) tablet 10 mg, 10 mg, Oral, Daily, Tu, Ching T, DO, 10 mg at 05/31/22 1012   FLUoxetine (PROZAC) capsule 20 mg, 20 mg, Oral, Daily, Tu, Ching T, DO, 20 mg at 05/31/22 1012   hydrOXYzine (ATARAX) tablet 25 mg, 25 mg, Oral, Q6H PRN, de Yolanda Manges, Cortney E, NP, 25 mg at 05/30/22 1458   insulin aspart (novoLOG) injection 0-15 Units, 0-15 Units, Subcutaneous, TID PC & HS, Tu, Ching T, DO, 3 Units at 06/01/22 0807   insulin aspart (novoLOG) injection 4 Units, 4 Units, Subcutaneous, TID WC, Adhikari, Amrit, MD, 4 Units at 06/01/22 0806   insulin glargine-yfgn (SEMGLEE) injection 15 Units, 15 Units, Subcutaneous, Daily, Adhikari, Amrit, MD, 15 Units at 05/31/22 1012   labetalol (NORMODYNE) injection 10 mg, 10 mg, Intravenous, Q2H PRN, Kathryne Eriksson, NP   Oral care mouth rinse, 15 mL, Mouth Rinse, PRN, Tu, Ching T, DO   pantoprazole (PROTONIX) EC tablet 40 mg, 40 mg, Oral, Daily, Tu, Ching T, DO, 40 mg at 05/31/22 1011   Patients Current Diet:  Diet Order                  Diet Heart Room service appropriate? Yes; Fluid  consistency: Thin  Diet effective now                         Precautions / Restrictions Precautions Precautions: Fall Restrictions Weight Bearing Restrictions: No    Has the patient had 2 or more falls or a fall with injury in the past year? No   Prior Activity Level Community (5-7x/wk): Pt. active in the community PTA   Prior Functional Level Self Care: Did the patient need help bathing, dressing, using the toilet or eating? Independent   Indoor Mobility: Did the patient need assistance with walking from room to room (with or without device)? Independent   Stairs: Did the patient need assistance with internal or external stairs (with or without device)? Independent   Functional Cognition: Did the patient need help planning regular tasks such as shopping  or remembering to take medications? Independent   Patient Information Are you of Hispanic, Latino/a,or Spanish origin?: A. No, not of Hispanic, Latino/a, or Spanish origin What is your race?: B. Black or African American Do you need or want an interpreter to communicate with a doctor or health care staff?: 0. No   Patient's Response To:  Health Literacy and Transportation Is the patient able to respond to health literacy and transportation needs?: Yes Health Literacy - How often do you need to have someone help you when you read instructions, pamphlets, or other written material from your doctor or pharmacy?: Never In the past 12 months, has lack of transportation kept you from medical appointments or from getting medications?: No In the past 12 months, has lack of transportation kept you from meetings, work, or from getting things needed for daily living?: No   Home Assistive Devices / [place] Devices/Equipment: Radio producer (specify quad or straight), Eyeglasses Home Equipment: Rolling Walker (2 wheels)   Prior Device Use: Indicate devices/aids used by the patient prior to current illness, exacerbation or  injury? None of the above   Current Functional Level Cognition   Arousal/Alertness: Awake/alert Overall Cognitive Status: Within Functional Limits for tasks assessed Orientation Level: Oriented X4 Attention: Sustained Sustained Attention: Appears intact Memory: Impaired (recalled correct address except one component of address recall - 1/5 components) Awareness: Impaired Awareness Impairment: Intellectual impairment (stated she thought she could get up independently but with delay, she was able to articulate fall risk) Problem Solving: Appears intact    Extremity Assessment (includes Sensation/Coordination)   Upper Extremity Assessment: LUE deficits/detail LUE Deficits / Details: noted to have decreased strength compared to RUE. unable to AROM FF with ABDuction to touch head. patient was able to participate in AAROM to about 80 degrees with resistance noted with attempted FF. patient unable to participate in finger opposition on digits. able to grossly grasp objects. LUE Coordination: decreased gross motor, decreased fine motor  Lower Extremity Assessment: Defer to PT evaluation LLE Deficits / Details: ankle dorsiflexion 0/5, plantarflexion 0/5, quad 3/5, hip flexor 3/5, hip abduction 2-/5, hip adduction 2/5 LLE Sensation: decreased light touch LLE Coordination: decreased fine motor     ADLs   Overall ADL's : Needs assistance/impaired Eating/Feeding: Sitting Eating/Feeding Details (indicate cue type and reason): EOB with set up opening small containers. Grooming: Sitting, Minimal assistance Upper Body Bathing: Moderate assistance, Sitting Lower Body Bathing: Moderate assistance, Sit to/from stand, Sitting/lateral leans Upper Body Dressing : Sitting, Minimal assistance Lower Body Dressing: Moderate assistance, Sit to/from stand, Sitting/lateral leans Lower Body Dressing Details (indicate cue type and reason): to don undergarments with noted buckling of LLE with standing. patient  appeared to have decreased awareness of deficits. Toilet Transfer: Maximal assistance, Rolling walker (2 wheels), [employer] Transfer Details (indicate cue type and reason): patient was able to scoot up to [facility] to R side with increased time. Toileting- Clothing Manipulation and Hygiene: Total assistance, Sit to/from stand     Mobility   Overal bed mobility: Needs Assistance Bed Mobility: Supine to Sit, Sit to Supine Supine to sit: Min guard, HOB elevated Sit to supine: Min guard General bed mobility comments: increased time and effort     Transfers   Overall transfer level: Needs assistance Equipment used: Rolling walker (2 wheels) Transfers: Sit to/from Stand Sit to Stand: Min assist General transfer comment: cues for hand placement, and balance support, took orthostatic vitals standing in front of the bed     Ambulation /  Gait / Stairs / Emergency planning/management officer   Ambulation/Gait General Gait Details: NT due to high BP with standing and L LE weakness     Posture / Balance Balance Overall balance assessment: Needs assistance Sitting-balance support: No upper extremity supported Sitting balance-Leahy Scale: Fair Standing balance support: Bilateral upper extremity supported Standing balance-Leahy Scale: Poor Standing balance comment: weight sifted over R LE due to L LE weakness     Special needs/care consideration Special service needs none    Previous Home Environment (from acute therapy documentation) Living Arrangements: Alone  Lives With: Alone Available Help at Discharge: Family Type of Home: Apartment Home Layout: One level Home Access: Level entry Entrance Stairs-Rails: None Entrance Stairs-Number of Steps: 3 Bathroom Shower/Tub: Optometrist: Yes How Accessible: Accessible via walker Home Care Services: No Additional Comments: No falls last 6 months   Discharge Living Setting Plans for Discharge Living  Setting: Patient's home, Apartment Type of Home at Discharge: Apartment Discharge Home Layout: One level Discharge Home Access: Level entry Discharge Bathroom Shower/Tub: None Discharge Bathroom Toilet: Standard Discharge Bathroom Accessibility: Yes How Accessible: Accessible via walker Does the patient have any problems obtaining your medications?: No   Social/Family/Support Systems Patient Roles: Other (Comment) Contact Information: Lamoine Fredricksen Anticipated Caregiver: Best friend [phone] Anticipated Caregiver's Contact Information: Pt.'s friend can do nights, Sister states she can assist days, Neice also to assist Ability/Limitations of Caregiver: 24/7 Caregiver Availability: 24/7 Discharge Plan Discussed with Primary Caregiver: Yes Is Caregiver In Agreement with Plan?: Yes Does Caregiver/Family have Issues with Lodging/Transportation while Pt is in Rehab?: No   Goals Patient/Family Goal for Rehab: PT/OT/SLP Min A to Supervision  Expected length of stay: 14-16 days  Pt/Family Agrees to Admission and willing to participate: Yes Program Orientation Provided & Reviewed with Pt/Caregiver Including Roles  & Responsibilities: Yes   Decrease burden of Care through IP rehab admission: none    Possible need for SNF placement upon discharge: none    Patient Condition: I have reviewed medical records from [facility], spoken with CM, and patient and family member. I met with patient at the bedside and discussed via phone for inpatient rehabilitation assessment.  Patient will benefit from ongoing PT, OT, and SLP, can actively participate in 3 hours of therapy a day 5 days of the week, and can make measurable gains during the admission.  Patient will also benefit from the coordinated team approach during an Inpatient Acute Rehabilitation admission.  The patient will receive intensive therapy as well as Rehabilitation physician, nursing, social worker, and care  management interventions.  Due to safety, skin/wound care, disease management, medication administration, pain management, and patient education the patient requires 24 hour a day rehabilitation nursing.  The patient is currently mod +2  with mobility and basic ADLs.  Discharge setting and therapy post discharge at home with home health is anticipated.  Patient has agreed to participate in the Acute Inpatient Rehabilitation Program and will admit today.   Preadmission Screen Completed By:  Genella Mech, 06/01/2022 9:27 AM ______________________________________________________________________   Discussed status with Dr. Tressa Busman  on 1600 at 06/03/22 and received approval for admission today.   Admission Coordinator:  Genella Mech, CCC-SLP, time 815/Date  06/04/22 Assessment/Plan: Diagnosis: Does the need for close, 24 hr/day Medical supervision in concert with the patient's rehab needs make it unreasonable for this patient to be served in a less intensive setting? Yes Co-Morbidities requiring supervision/potential complications: R medullary CVA, AKI on  CKD stage III, anemia, uncontrolled DM2, hypertension, CLL, pain management, and Hx GBS Due to safety, skin/wound care, disease management, medication administration, pain management, and patient education, does the patient require 24 hr/day rehab nursing? Yes Does the patient require coordinated care of a physician, rehab nurse, PT, OT, and SLP to address physical and functional deficits in the context of the above medical diagnosis(es)? Yes Addressing deficits in the following areas: balance, endurance, locomotion, strength, transferring, bathing, dressing, feeding, grooming, toileting, cognition, and speech Can the patient actively participate in an intensive therapy program of at least 3 hrs of therapy 5 days a week? Yes The potential for patient to make measurable gains while on inpatient rehab is good Anticipated functional outcomes upon discharge  from inpatient rehab: supervision and min assist PT, supervision and min assist OT, supervision and min assist SLP Estimated rehab length of stay to reach the above functional goals is: 14-16 days Anticipated discharge destination: Home 10. Overall Rehab/Functional Prognosis: good     MD Signature:   Gertie Gowda, DO 06/04/2022

## 2022-06-04 NOTE — H&P (Signed)
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 She also reported abdominal pain that ws treated with GI cocktail. 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  UA felt to be negative for UTI. 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    Neurology felt that plavix/Pletal adequate and no need to add another anti-platelet. To refer to cardiology lipid clinic for elevated LDL despite statin/Zetia as well as hydroxyzine for nerve/odd sensation in body. BP medications resumed 02/02 and therapy ongoing. Patient noted to have cognitive deficits with mild dysarthria, dizziness with activity, left hemiparesis with left knee instability and fatigue. CIR recommended due to functional decline.      Review of Systems  Constitutional:  Negative for chills and fever.  Eyes:  Negative for double vision.  Respiratory:  Negative for cough and shortness of breath.   Cardiovascular:  Negative for chest pain.  Gastrointestinal:  Positive for diarrhea. Negative for abdominal pain, constipation, nausea and vomiting.  Genitourinary:  Positive for frequency and urgency. Negative for dysuria.  Neurological:  Positive for focal weakness. Negative for headaches.            Past Medical History:   Diagnosis Date   Abnormality of gait 05/10/2010   BACK PAIN 11/14/2008   Class 1 obesity due to excess calories with body mass index (BMI) of 31.0 to 31.9 in adult 02/07/2021   DIABETES MELLITUS, TYPE II 07/15/2008   Diplopia 07/15/2008   ECZEMA, ATOPIC 04/03/2009   GERD (gastroesophageal reflux disease)     Guillain-Barre ([place]) 1988   HYPERLIPIDEMIA 03/06/2009   HYPERTENSION 07/15/2008   Stroke ([place]) 2010, 2011    x2    Vertebral artery stenosis             Past Surgical History:  Procedure Laterality Date   ABDOMINAL HYSTERECTOMY       DILATION AND CURETTAGE OF UTERUS       FOOT SURGERY       IR ANGIO INTRA EXTRACRAN SEL COM CAROTID INNOMINATE UNI L MOD SED   09/17/2021   IR ANGIO INTRA EXTRACRAN SEL INTERNAL CAROTID UNI R MOD SED   09/17/2021   IR ANGIO VERTEBRAL SEL VERTEBRAL UNI R MOD SED   09/17/2021   IR US GUIDE VASC ACCESS RIGHT   09/17/2021           Family History  Problem Relation Age of Onset   Diabetes Sister     Asthma Other     Stroke Other     Hypertension Other     Diabetes Mother     Stroke Mother     Cancer Father          pt states hae had some kind of stomach cancer, ? stomach or colon   Breast cancer Neg Hx  Social History:  Lives alone and independent with cane PTA. Was working as a Sports coach at [employer]?  Sister and friend to assist after discharge?  Per reports that she has never smoked. She has never used smokeless tobacco. She reports that she does not drink alcohol and does not use drugs.     Allergies: No Known Allergies           Medications Prior to Admission  Medication Sig Dispense Refill   amLODipine (NORVASC) 10 MG tablet Take 1 tablet by mouth once daily 90 tablet 1   atorvastatin (LIPITOR) 80 MG tablet Take 1 tablet (80 mg total) by mouth daily. 30 tablet 3   B Complex Vitamins (VITAMIN B COMPLEX) TABS Take 1 tablet by mouth every morning.       Cholecalciferol (VITAMIN D3 PO) Take 1 capsule by mouth every morning.        cilostazol (PLETAL) 50 MG tablet Take 1 tablet by mouth twice daily 180 tablet 0   clopidogrel (PLAVIX) 75 MG tablet Take 1 tablet (75 mg total) by mouth daily. 30 tablet 3   Dulaglutide (TRULICITY) 0.07 HQ/1.9XJ SOPN Inject 0.75 mg into the skin once a week. 6 mL 3   ezetimibe (ZETIA) 10 MG tablet Take 1 tablet (10 mg total) by mouth daily. 30 tablet 3   FLUoxetine (PROZAC) 20 MG capsule Take 1 capsule by mouth once daily 90 capsule 2   hydrALAZINE (APRESOLINE) 50 MG tablet TAKE 1 TABLET BY MOUTH THREE TIMES DAILY 90 tablet 2   insulin glargine (LANTUS) 100 UNIT/ML Solostar Pen Inject 30 Units into the skin daily. And pen needles 1/day 15 mL 3   insulin lispro (HUMALOG KWIKPEN) 200 UNIT/ML KwikPen Inject 8 Units into the skin 3 (three) times daily before meals. 10 min before meals 15 mL 6   lisinopril-hydrochlorothiazide (ZESTORETIC) 20-12.5 MG tablet Take 2 tablets by mouth once daily 180 tablet 1   metoprolol succinate (TOPROL-XL) 50 MG 24 hr tablet TAKE 1 TABLET BY MOUTH ONCE DAILY WITH MEALS OR  IMMEDIATELY  FOLLOWING  A  MEAL (Patient taking differently: 50 mg every morning.) 30 tablet 3   pantoprazole (PROTONIX) 40 MG tablet Take 1 tablet (40 mg total) by mouth daily. 30 tablet 3   Blood Pressure Monitoring (BLOOD PRESSURE MONITOR AUTOMAT) DEVI 1 Device by Does not apply route daily. 1 Device 0   gabapentin (NEURONTIN) 300 MG capsule Take 1 capsule (300 mg total) by mouth daily for 1 day, THEN 1 capsule (300 mg total) 2 (two) times daily for 1 day, THEN 1 capsule (300 mg total) 3 (three) times daily for 28 days. 87 capsule 0   glucose blood (CONTOUR NEXT TEST) test strip 1 each by Other route 2 (two) times daily. And lancets 2/day 200 each 3   glucose blood (ONETOUCH VERIO) test strip USE TO CHECK BLOOD SUGAR TWICE A DAY AND PRN 100 each 6   ONE TOUCH LANCETS MISC USE TO CHECK BLOOD SUGAR TWICE A DAY AND PRN 100 each 6          Home: Home Living Family/patient expects to be discharged  to:: Private residence Living Arrangements: Alone Available Help at Discharge: Family Type of Home: Apartment Home Access: Level entry Entrance Stairs-Number of Steps: 3 Entrance Stairs-Rails: None Home Layout: One level Bathroom Shower/Tub: Chiropodist: Standard Bathroom Accessibility: Yes Home Equipment: Conservation officer, nature (2 wheels) Additional Comments: No falls last 6 months  Lives With: Alone   Functional  History: Prior Function Prior Level of Function : Independent/Modified Independent Mobility Comments: walks with a cane   Functional Status:  Mobility: Bed Mobility Overal bed mobility: Needs Assistance Bed Mobility: Supine to Sit Supine to sit: Mod assist, HOB elevated Sit to supine: Max assist General bed mobility comments: up in recliner Transfers Overall transfer level: Needs assistance Equipment used: Hemi-walker Transfers: Sit to/from Stand Sit to Stand: Mod assist, +2 physical assistance Bed to/from chair/wheelchair/BSC transfer type:: Step pivot Step pivot transfers: Mod assist, +2 physical assistance General transfer comment: Requires hemiwalker, blocking of Left knee, verbal cues for sequencing to step. Assitance initially to advance LLE but improved thereafter. Knee hyperextending with steps. patient also not weight bearing enough through RLE at times. Ambulation/Gait Ambulation/Gait assistance: Mod assist, +2 safety/equipment Gait Distance (Feet): 8 Feet Assistive device: Hemi-walker Gait Pattern/deviations: Step-to pattern, Decreased step length - right, Decreased step length - left, Decreased weight shift to left General Gait Details: therapist manually blocked L knee 2* buckling with weight shift to L, VCs for sequencing Gait velocity: decr   ADL: ADL Overall ADL's : Needs assistance/impaired Eating/Feeding: Sitting Eating/Feeding Details (indicate cue type and reason): EOB with set up opening small containers. Grooming: Sitting,  Minimal assistance Upper Body Bathing: Moderate assistance, Sitting Lower Body Bathing: Moderate assistance, Sit to/from stand, Sitting/lateral leans Upper Body Dressing : Sitting, Minimal assistance Lower Body Dressing: Moderate assistance, Sit to/from stand, Sitting/lateral leans Lower Body Dressing Details (indicate cue type and reason): to don undergarments with noted buckling of LLE with standing. patient appeared to have decreased awareness of deficits. Toilet Transfer: Moderate assistance, +2 for physical assistance, BSC/3in1 Toilet Transfer Details (indicate cue type and reason): Mod x 2 to pivot to recliner. one person to assist with her maintaining stand position and good hold with RUE and the other blocking knee to reduce buckle. Toileting- Clothing Manipulation and Hygiene: Total assistance, Sit to/from stand, +2 for physical assistance Toileting - Clothing Manipulation Details (indicate cue type and reason): incontinent of BM. Reports she can't feel it. Total assistfor toileting while second therapist steadied patient in standing with verbal and tactile cues for posture Functional mobility during ADLs: Minimal assistance, +2 for physical assistance General ADL Comments: Min x 2 today for standing toileting task. Focus on appropriate standing posture with verbal and tactile cues during total toiletng care .   Cognition: Cognition Overall Cognitive Status: Within Functional Limits for tasks assessed Arousal/Alertness: Awake/alert Orientation Level: Oriented X4 Year: 2024 Month: January Day of Week: Correct Attention: Sustained Sustained Attention: Appears intact Memory: Impaired (recalled correct address except one component of address recall - 1/5 components) Awareness: Impaired Awareness Impairment: Intellectual impairment (stated she thought she could get up independently but with delay, she was able to articulate fall risk) Problem Solving: Appears  intact Cognition Arousal/Alertness: Awake/alert Behavior During Therapy: WFL for tasks assessed/performed Overall Cognitive Status: Within Functional Limits for tasks assessed General Comments: alert and oriented, very motivated     Blood pressure (!) 163/84, pulse 65, temperature 98.6 F (37 C), temperature source Oral, resp. rate 16, height '5\' 4"'$  (1.626 m), weight 81.2 kg, SpO2 100 %. Physical Exam   Constitutional: No apparent distress. Appropriate appearance for age. +obese.  HENT: No JVD. Neck Supple. Trachea midline. Atraumatic, normocephalic. Eyes: PERRLA. EOMI. Visual fields grossly intact.  Cardiovascular: RRR, no murmurs/rub/gallops. No Edema. Peripheral pulses 2+  Respiratory: CTAB. No rales, rhonchi, or wheezing. On RA.  Abdomen: + bowel sounds, normoactive. No distention or tenderness.  GU: Not examined.  No catheter.  Skin: C/D/I. No apparent lesions. MSK:      No apparent deformity.      Strength:                RUE: 5/5 SA, 5/5 EF, 5/5 EE, 5/5 WE, 5/5 FF, 5/5 FA                 LUE: 4-/5 SA, 3-/5 EF, 3-/5 EE, 1/5 WE, 2/5 FF, 0/5 FA                 RLE: 5/5 HF, 5/5 KE, 5/5 DF, 5/5 EHL, 5/5 PF                 LLE:  3-/5 HF, 2-/5 KE, 0/5 DF, 0/5 EHL, 0/5 PF   Neurologic exam:  Cognition: AAO to person, place, time and event.  Language: Fluent, No substitutions or neoglisms. + mild dysarthria. Names 3/3 objects correctly.  Memory: Recalls 3/3 objects at 5 minutes. No apparent deficits  Insight: Good  insight into current condition.  Mood: Pleasant affect, appropriate mood.  Sensation: To light touch intact in BL Ues; decreased in L foot Reflexes: 2+ in BL UE and LEs. + LUE Hoffman's ; no babinski signs bilaterally.  CN: + L tongue deviation + L shoulder weakness Coordination: No apparent tremors. No ataxia on R FTN, HTS  Spasticity: MAS 0 in all extremities.      Lab Results Last 48 Hours        Results for orders placed or performed during the hospital  encounter of 05/29/22 (from the past 48 hour(s))  Glucose, capillary     Status: None    Collection Time: 06/01/22  9:30 PM  Result Value Ref Range    Glucose-Capillary 95 70 - 99 mg/dL      Comment: Glucose reference range applies only to samples taken after fasting for at least 8 hours.  Renal function panel     Status: Abnormal    Collection Time: 06/02/22  5:55 AM  Result Value Ref Range    Sodium 140 135 - 145 mmol/L    Potassium 3.9 3.5 - 5.1 mmol/L    Chloride 112 (H) 98 - 111 mmol/L    CO2 22 22 - 32 mmol/L    Glucose, Bld 114 (H) 70 - 99 mg/dL      Comment: Glucose reference range applies only to samples taken after fasting for at least 8 hours.    BUN 23 8 - 23 mg/dL    Creatinine, Ser 1.32 (H) 0.44 - 1.00 mg/dL    Calcium 9.5 8.9 - 10.3 mg/dL    Phosphorus 3.9 2.5 - 4.6 mg/dL    Albumin 2.8 (L) 3.5 - 5.0 g/dL    GFR, Estimated 45 (L) >60 mL/min      Comment: (NOTE) Calculated using the CKD-EPI Creatinine Equation (2021)      Anion gap 6 5 - 15      Comment: Performed at [facility], [place] [address], [place], [place] [address]  Magnesium     Status: None    Collection Time: 06/02/22  5:55 AM  Result Value Ref Range    Magnesium 2.1 1.7 - 2.4 mg/dL      Comment: Performed at [facility], [place] [address], [place], [place] [address]  CBC     Status: Abnormal    Collection Time: 06/02/22  5:55 AM  Result Value Ref Range    WBC  6.5 4.0 - 10.5 K/uL    RBC 4.46 3.87 - 5.11 MIL/uL    Hemoglobin 11.2 (L) 12.0 - 15.0 g/dL    HCT 35.8 (L) 36.0 - 46.0 %    MCV 80.3 80.0 - 100.0 fL    MCH 25.1 (L) 26.0 - 34.0 pg    MCHC 31.3 30.0 - 36.0 g/dL    RDW 15.4 11.5 - 15.5 %    Platelets 239 150 - 400 K/uL    nRBC 0.0 0.0 - 0.2 %      Comment: Performed at [facility], [place] [address], [place], [place] [address]  Glucose, capillary     Status: Abnormal    Collection Time: 06/02/22  7:40 AM  Result Value Ref Range     Glucose-Capillary 104 (H) 70 - 99 mg/dL      Comment: Glucose reference range applies only to samples taken after fasting for at least 8 hours.  Glucose, capillary     Status: Abnormal    Collection Time: 06/02/22 12:01 PM  Result Value Ref Range    Glucose-Capillary 184 (H) 70 - 99 mg/dL      Comment: Glucose reference range applies only to samples taken after fasting for at least 8 hours.  Glucose, capillary     Status: Abnormal    Collection Time: 06/02/22  1:07 PM  Result Value Ref Range    Glucose-Capillary 216 (H) 70 - 99 mg/dL      Comment: Glucose reference range applies only to samples taken after fasting for at least 8 hours.  Glucose, capillary     Status: Abnormal    Collection Time: 06/02/22  4:01 PM  Result Value Ref Range    Glucose-Capillary 202 (H) 70 - 99 mg/dL      Comment: Glucose reference range applies only to samples taken after fasting for at least 8 hours.  Glucose, capillary     Status: Abnormal    Collection Time: 06/02/22  9:19 PM  Result Value Ref Range    Glucose-Capillary 131 (H) 70 - 99 mg/dL      Comment: Glucose reference range applies only to samples taken after fasting for at least 8 hours.  Renal function panel     Status: Abnormal    Collection Time: 06/03/22  4:56 AM  Result Value Ref Range    Sodium 137 135 - 145 mmol/L    Potassium 3.5 3.5 - 5.1 mmol/L    Chloride 106 98 - 111 mmol/L    CO2 23 22 - 32 mmol/L    Glucose, Bld 91 70 - 99 mg/dL      Comment: Glucose reference range applies only to samples taken after fasting for at least 8 hours.    BUN 24 (H) 8 - 23 mg/dL    Creatinine, Ser 1.22 (H) 0.44 - 1.00 mg/dL    Calcium 9.5 8.9 - 10.3 mg/dL    Phosphorus 3.6 2.5 - 4.6 mg/dL    Albumin 2.7 (L) 3.5 - 5.0 g/dL    GFR, Estimated 50 (L) >60 mL/min      Comment: (NOTE) Calculated using the CKD-EPI Creatinine Equation (2021)      Anion gap 8 5 - 15      Comment: Performed at [facility], [place] [address],  [place],  [address]  Magnesium     Status: None    Collection Time: 06/03/22  4:56 AM  Result Value Ref Range    Magnesium 2.1 1.7 -  2.4 mg/dL      Comment: Performed at [facility], [place] [address] [place], [place] [address]  Glucose, capillary     Status: None    Collection Time: 06/03/22  9:01 AM  Result Value Ref Range    Glucose-Capillary 97 70 - 99 mg/dL      Comment: Glucose reference range applies only to samples taken after fasting for at least 8 hours.  Glucose, capillary     Status: Abnormal    Collection Time: 06/03/22  1:21 PM  Result Value Ref Range    Glucose-Capillary 236 (H) 70 - 99 mg/dL      Comment: Glucose reference range applies only to samples taken after fasting for at least 8 hours.      Imaging Results (Last 48 hours)  No results found.         Blood pressure (!) 163/84, pulse 65, temperature 98.6 F (37 C), temperature source Oral, resp. rate 16, height '5\' 4"'$  (1.626 m), weight 81.2 kg, SpO2 100 %.   Medical Problem List and Plan: 1. Functional deficits secondary to acute right medullary CVA              -patient may  shower             -ELOS/Goals: 14-16 days; min assist PT, supervision and min assist OT, supervision and min assist SLP  2.  Antithrombotics: -DVT/anticoagulation:  Pharmaceutical: Lovenox             -antiplatelet therapy: Plavix/Pletal 3. Pain Management:  Tylenol prn.  4. Mood/Behavior/Sleep:  LCSW to follow for evaluation and support.              -antipsychotic agents: N/A             --trazodone prn for insomnia.  5. Neuropsych/cognition: This patient is capable of making decisions on her own behalf. 6. Skin/Wound Care: Routine pressure relief measures.  7. Fluids/Electrolytes/Nutrition: Monitor I/O. Check CMET in am 8. HTN; Monitor BP TID--avoid hypoperfusion.  --continue hydralazine, losartan, amlodipine and metoprolol    06/04/2022   10:16 PM 06/04/2022    8:18 PM 06/04/2022    2:12 PM  Vitals with BMI   Systolic [phone]   546  Diastolic 69 60 84   84  Pulse 56 59 61    9. T2DM w/peripheral neuroapthy: Hgb A1C- 11.4 (improving >14 11/23) Poorly controlled.              --was on Trulicity weekly, lantus 30 units and novolog 8 units TID ac. --Continue insulin glargine 15 units BID with 5 units meal coverage TID. Titrate as indicated.              --am hypoglycemia noted. Will change SSI to sensitive.  Recent Labs    06/04/22 1123 06/04/22 1616 06/04/22 2129  GLUCAP 186* 269* 235*     10. Elevated lipids: On Lipitor and Zetia.  11. PVD: On pletal and atorvastatin. 12. H/o depression/anxiety: Continue Prozax with xanax/hydroxyzine prn.  13. Obesity: BMI-30. Educate on diet and wt loss to help promote health and mobility.  14. CKD: Baseline SCr 1.3-1.4. Avoid nephrotoxic agents.              --note rise in BUN--encourage fluid intake. Monitor with serial checks.  15. Dyslipidemia: Continue Zetia/Lipitor.  --Refer to cardiology for  PCSK9? (Has seen Dr. [name] for PAD)       Bary Leriche, PA-C 06/03/2022  I have  examined the patient independently and edited the note for HPI, ROS, exam, assessment, and plan as appropriate. I am in agreement with the above recommendations.   Gertie Gowda, DO 06/04/2022

## 2022-06-05 DIAGNOSIS — I639 Cerebral infarction, unspecified: Secondary | ICD-10-CM | POA: Diagnosis not present

## 2022-06-05 LAB — CBC WITH DIFFERENTIAL/PLATELET
Abs Immature Granulocytes: 0.02 10*3/uL (ref 0.00–0.07)
Basophils Absolute: 0 10*3/uL (ref 0.0–0.1)
Basophils Relative: 1 %
Eosinophils Absolute: 0.2 10*3/uL (ref 0.0–0.5)
Eosinophils Relative: 3 %
HCT: 32.6 % — ABNORMAL LOW (ref 36.0–46.0)
Hemoglobin: 10.9 g/dL — ABNORMAL LOW (ref 12.0–15.0)
Immature Granulocytes: 0 %
Lymphocytes Relative: 40 %
Lymphs Abs: 2.7 10*3/uL (ref 0.7–4.0)
MCH: 25.5 pg — ABNORMAL LOW (ref 26.0–34.0)
MCHC: 33.4 g/dL (ref 30.0–36.0)
MCV: 76.2 fL — ABNORMAL LOW (ref 80.0–100.0)
Monocytes Absolute: 1 10*3/uL (ref 0.1–1.0)
Monocytes Relative: 14 %
Neutro Abs: 2.9 10*3/uL (ref 1.7–7.7)
Neutrophils Relative %: 42 %
Platelets: 246 10*3/uL (ref 150–400)
RBC: 4.28 MIL/uL (ref 3.87–5.11)
RDW: 15.2 % (ref 11.5–15.5)
WBC: 6.8 10*3/uL (ref 4.0–10.5)
nRBC: 0 % (ref 0.0–0.2)

## 2022-06-05 LAB — COMPREHENSIVE METABOLIC PANEL
ALT: 78 U/L — ABNORMAL HIGH (ref 0–44)
AST: 59 U/L — ABNORMAL HIGH (ref 15–41)
Albumin: 2.5 g/dL — ABNORMAL LOW (ref 3.5–5.0)
Alkaline Phosphatase: 85 U/L (ref 38–126)
Anion gap: 5 (ref 5–15)
BUN: 26 mg/dL — ABNORMAL HIGH (ref 8–23)
CO2: 21 mmol/L — ABNORMAL LOW (ref 22–32)
Calcium: 9 mg/dL (ref 8.9–10.3)
Chloride: 110 mmol/L (ref 98–111)
Creatinine, Ser: 1.35 mg/dL — ABNORMAL HIGH (ref 0.44–1.00)
GFR, Estimated: 44 mL/min — ABNORMAL LOW (ref >60)
Glucose, Bld: 125 mg/dL — ABNORMAL HIGH (ref 70–99)
Potassium: 3.8 mmol/L (ref 3.5–5.1)
Sodium: 136 mmol/L (ref 135–145)
Total Bilirubin: 0.4 mg/dL (ref 0.3–1.2)
Total Protein: 6.1 g/dL — ABNORMAL LOW (ref 6.5–8.1)

## 2022-06-05 LAB — GLUCOSE, CAPILLARY
Glucose-Capillary: 127 mg/dL — ABNORMAL HIGH (ref 70–99)
Glucose-Capillary: 215 mg/dL — ABNORMAL HIGH (ref 70–99)
Glucose-Capillary: 149 mg/dL — ABNORMAL HIGH (ref 70–99)
Glucose-Capillary: 121 mg/dL — ABNORMAL HIGH (ref 70–99)

## 2022-06-05 MED ORDER — GERHARDT'S BUTT CREAM
TOPICAL_CREAM | CUTANEOUS | Status: DC | PRN
  Filled 2022-06-05: qty 1

## 2022-06-05 NOTE — Evaluation (Signed)
Physical Therapy Assessment and Plan  Patient Details  Name: [name] MRN: [id number] Date of Birth: 1960/05/15  PT Diagnosis: Abnormality of gait, Difficulty walking, and Hemiplegia non-dominant Rehab Potential: Good ELOS: 21-24 days   Today's Date: 06/05/2022 PT Individual Time: 0900-0958 PT Individual Time Calculation (min): 59 min    Hospital Problem: Principal Problem:   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[place])   Past Medical History:  Past Medical History:  Diagnosis Date   Abnormality of gait 05/10/2010   BACK PAIN 11/14/2008   Class 1 obesity due to excess calories with body mass index (BMI) of 31.0 to 31.9 in adult 02/07/2021   DIABETES MELLITUS, TYPE II 07/15/2008   Diplopia 07/15/2008   ECZEMA, ATOPIC 04/03/2009   GERD (gastroesophageal reflux disease)    Guillain-Barre ([place]) 1988   HYPERLIPIDEMIA 03/06/2009   HYPERTENSION 07/15/2008   Stroke ([place]) 2010, 2011   x2    Vertebral artery stenosis    Past Surgical History:  Past Surgical History:  Procedure Laterality Date   ABDOMINAL HYSTERECTOMY     DILATION AND CURETTAGE OF UTERUS     FOOT SURGERY     IR ANGIO INTRA EXTRACRAN SEL COM CAROTID INNOMINATE UNI L MOD SED  09/17/2021   IR ANGIO INTRA EXTRACRAN SEL INTERNAL CAROTID UNI R MOD SED  09/17/2021   IR ANGIO VERTEBRAL SEL VERTEBRAL UNI R MOD SED  09/17/2021   IR US GUIDE VASC ACCESS RIGHT  09/17/2021    Assessment & Plan Clinical Impression: Patient is a 62 year old female with history of T2DM, GBS, diplopia, HTN, 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 She also reported abdominal pain that ws treated with GI cocktail. 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  UA felt to be negative for UTI. 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   Patient transferred to CIR on 06/04/2022 .   Patient currently requires max with mobility secondary to  L hemiplegia .  Prior to hospitalization, patient was independent  with mobility and lived with Alone in a [place] home.  Home access is 3Stairs to enter.  Patient will benefit from skilled PT intervention to maximize safe functional mobility, minimize fall risk, and decrease caregiver burden for planned discharge home with 24 hour assist.  Anticipate patient will benefit from follow up [facility] at discharge.  PT - End of Session Activity Tolerance: Tolerates 30+ min activity with multiple rests Endurance Deficit: Yes Endurance Deficit Description: limited endurance 2/2 dependency on R hemibody for movement PT Assessment Rehab Potential (ACUTE/IP ONLY): Good PT Barriers to Discharge: Home environment access/layout PT Barriers to Discharge Comments: 3 STE PT Patient demonstrates impairments in the following area(s): Balance;Endurance;Motor;Safety PT Transfers Functional Problem(s): Bed Mobility;Bed to Chair;Car PT Locomotion Functional Problem(s): Ambulation;Stairs PT Plan PT Intensity: Minimum of 1-2 x/day ,45 to 90 minutes PT Frequency: 5 out of 7 days PT Duration Estimated Length of Stay: 21-24 days PT Treatment/Interventions: Ambulation/gait training;Balance/vestibular training;Cognitive remediation/compensation;Discharge planning;Community reintegration;DME/adaptive equipment instruction;Functional electrical stimulation;Functional mobility training;Neuromuscular re-education;Pain management;Patient/family education;Stair training;Splinting/orthotics;Therapeutic Activities;Psychosocial support;Therapeutic Exercise;UE/LE Strength  taining/ROM;UE/LE Coordination activities PT Transfers Anticipated Outcome(s): supervision PT Locomotion Anticipated Outcome(s): CGA  PT Recommendation Follow Up Recommendations: Home health PT Patient destination: Home Equipment Recommended: To be determined Equipment Details: TBD on LRAD   PT Evaluation Precautions/Restrictions Precautions Precautions: Fall Precaution Comments: Left Hemiparesis, Left knee buckles, support left arm in standing Restrictions Weight Bearing Restrictions: No General Chart Reviewed: Yes Family/Caregiver Present: No Vital Signs Pain Pain Assessment Pain Scale: 0-10 Pain Score: 0-No pain Pain Interference Pain Interference Pain Effect on Sleep: 1. Rarely or not at all Pain Interference with Therapy Activities: 1. Rarely or not at all Pain Interference with Day-to-Day Activities: 1. Rarely or not at all Home Living/Prior [place] Available Help at Discharge: Family Type of Home: Apartment Home Access: Stairs to enter Technical brewer of Steps: 3 Entrance Stairs-Rails: None Home Layout: One level Bathroom Shower/Tub: Optometrist: Yes  Lives With: Alone Prior Function Vocation: Retired Vision/Perception  Vision - History Ability to See in Adequate Light: 0 Adequate Perception Perception: Impaired Praxis Praxis: Impaired Praxis Impairment Details: Motor planning  Cognition Overall Cognitive Status: Within Functional Limits for tasks assessed Arousal/Alertness: Awake/alert Orientation Level: Oriented X4 Memory: Impaired Awareness: Impaired Problem Solving: Appears intact Safety/Judgment: Appears intact Sensation Sensation Light Touch: Appears Intact Coordination Gross Motor Movements are Fluid and Coordinated: No Fine Motor Movements are Fluid and Coordinated: No Coordination and Movement Description: limited by dense L hemi Heel Shin Test: LLE  flaccid Motor  Motor Motor: Hemiplegia Motor - Skilled Clinical Observations: dense L hemi for UE/LE   Trunk/Postural Assessment  Cervical Assessment Cervical Assessment: Exceptions to [facility] Thoracic Assessment Thoracic Assessment: Exceptions to [facility] Lumbar Assessment Lumbar Assessment: Exceptions to [facility] Postural Control Postural Control: Deficits on evaluation  Balance Balance Balance Assessed: Yes Static Sitting Balance Static Sitting - Balance Support: Feet unsupported Static Sitting - Level of Assistance: 5: Stand by assistance Dynamic Sitting Balance Dynamic Sitting - Balance Support: Feet unsupported Dynamic Sitting - Level of Assistance: 3: Mod assist Dynamic Sitting - Balance Activities: Lateral lean/weight shifting;Forward lean/weight shifting;Reaching for objects Static Standing Balance Static Standing - Balance Support: During functional activity;Bilateral upper extremity supported Static Standing - Level of Assistance: 3: Mod assist Dynamic Standing Balance Dynamic Standing - Balance Support: During functional activity;Bilateral upper extremity supported Dynamic Standing - Level of Assistance: 2: Max assist Dynamic Standing - Balance Activities: Forward lean/weight shifting Extremity Assessment  RUE Assessment RUE Assessment: Within Functional Limits LUE Assessment LUE Assessment: Exceptions to [facility] Active Range of Motion (AROM) Comments: limited by dense L hemi LUE Body System: Neuro Brunstrum levels for arm and hand: Arm;Hand Brunstrum level for arm: Stage II Synergy is developing Brunstrum level for hand: Stage II Synergy is developing RLE Assessment RLE Assessment: Within Functional Limits LLE Assessment LLE Assessment: Exceptions to [facility] General Strength Comments: gross 2/5 proximal and 0/5 distal LLE Strength LLE Overall Strength:  (gross 2/5 proximal and 0/5 distal)  Care Tool Care Tool Bed Mobility Roll left and right activity   Roll left and right  assist level: Moderate Assistance - Patient 50 - 74%    Sit to lying activity   Sit to lying assist level: Moderate Assistance - Patient 50 - 74%    Lying to sitting on side of bed activity   Lying to sitting on side of bed assist level: the ability to move from lying on the back to sitting on the side of the bed with no back support.: Moderate Assistance - Patient 50 - 74%     Care Tool Transfers Sit to stand  transfer   Sit to stand assist level: Moderate Assistance - Patient 50 - 74%    Chair/bed transfer   Chair/bed transfer assist level: 2 Pension scheme manager transfer   Assist Level: Dependent - Patient 0%    Geneticist, molecular transfer assist level: Maximal Assistance - Patient 25 - 49%      Care Tool Locomotion Ambulation Ambulation activity did not occur: Safety/medical concerns (limited 2/2 LLE deficits)        Walk 10 feet activity Walk 10 feet activity did not occur: Safety/medical concerns (limited 2/2 LLE deficits)       Walk 50 feet with 2 turns activity Walk 50 feet with 2 turns activity did not occur: Safety/medical concerns (limited 2/2 LLE deficits)      Walk 150 feet activity Walk 150 feet activity did not occur: Safety/medical concerns (limited 2/2 LLE deficits)      Walk 10 feet on uneven surfaces activity Walk 10 feet on uneven surfaces activity did not occur: Safety/medical concerns (limited 2/2 LLE deficits)      Stairs Stair activity did not occur: Safety/medical concerns (limited 2/2 LLE deficits)        Walk up/down 1 step activity Walk up/down 1 step or curb (drop down) activity did not occur: Safety/medical concerns (limited 2/2 LLE deficits)      Walk up/down 4 steps activity Walk up/down 4 steps activity did not occur: Safety/medical concerns (limited 2/2 LLE deficits)      Walk up/down 12 steps activity Walk up/down 12 steps activity did not occur: Safety/medical concerns (limited 2/2 LLE deficits)      Pick up small objects from  floor Pick up small object from the floor (from standing position) activity did not occur: Safety/medical concerns (limited 2/2 LLE deficits)      Wheelchair Is the patient using a wheelchair?: No   Wheelchair activity did not occur: N/A      Wheel 50 feet with 2 turns activity Wheelchair 50 feet with 2 turns activity did not occur: N/A    Wheel 150 feet activity Wheelchair 150 feet activity did not occur: N/A      Refer to Care Plan for Long Term Goals  SHORT TERM GOAL WEEK 1 PT Short Term Goal 1 (Week 1): Pt will complete bed to chair transfer with ModA + 1 PT Short Term Goal 2 (Week 1): Pt will complete supine <> sit with CGA PT Short Term Goal 3 (Week 1): Pt will ambulate 10 ft with LRAD PT Short Term Goal 4 (Week 1): Pt will complete sit to stand with CGA  + LRAD  Recommendations for other services: None   Skilled Therapeutic Intervention Mobility Bed Mobility Bed Mobility: Rolling Right;Rolling Left;Right Sidelying to Sit;Sit to Supine Rolling Right: Moderate Assistance - Patient 50-74% Rolling Left: Moderate Assistance - Patient 50-74% Right Sidelying to Sit: Moderate Assistance - Patient 50-74% Sit to Supine: Moderate Assistance - Patient 50-74% Transfers Transfers: Sit to Stand;Stand Pivot Transfers (Simultaneous filing. User may not have seen previous data.) Sit to Stand: Moderate Assistance - Patient 50-74% Stand to Sit: Maximal Assistance - Patient 25-49% Stand Pivot Transfers: Maximal Assistance - Patient 25 - 49%;2 Helpers Stand Pivot Transfer Details: Verbal cues for sequencing;Verbal cues for technique;Verbal cues for precautions/safety;Verbal cues for safe use of DME/AE Transfer (Assistive device): Hemi-walker (stedy) Locomotion  Gait Ambulation: No Gait Gait: No Stairs / Additional Locomotion Stairs: No   Discharge Criteria: Patient will be discharged from PT  if patient refuses treatment 3 consecutive times without medical reason, if treatment goals  not met, if there is a change in medical status, if patient makes no progress towards goals or if patient is discharged from hospital.  The above assessment, treatment plan, treatment alternatives and goals were discussed and mutually agreed upon: by patient   Session Note: Chart reviewed and pt agreeable to therapy. Pt received seated in recliner with no c/o pain. Session focused on evaluation and initiation of functional mobility practice to promote safe home access. Pt initiated session with evaluation as described above. Pt then completed sit to stand using MinA + 2 + STEDY. Pt then transferred to [facility]. From [place], pt completed sit to stand with MinA + 2 + HW. Pt attempted marching in place with noted lack of B foot clearance. Pt then transferred to therapy gym for time. In gym, pt completed SPT to mat with MinA + 2 + HW. Pt then transferred sit to supine with [place] for LE management. In supine, pt completed AAROM of LLE with noted improvement of mobility in gravity independent position. Pt then returned to sitting with [place] and returned to [facility] with [place]. Session education emphasized PT goals. At end of session, pt was left seated in recliner with alarm engaged, nurse call bell and all needs in reach.   Marquette Old, PT, DPT 06/05/2022, 12:47 PM

## 2022-06-05 NOTE — Evaluation (Addendum)
Occupational Therapy Assessment and Plan  Patient Details  Name: [name] MRN: [id number] Date of Birth: 11/19/1960  OT Diagnosis: abnormal posture, altered mental status, ataxia, cognitive deficits, hemiplegia affecting non-dominant side, and muscle weakness (generalized) Rehab Potential:   ELOS: 18-21 days   Today's Date: 06/05/2022 OT Individual Time: 1100-1155 OT Individual Time Calculation (min): 55 min     Hospital Problem: Principal Problem:   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[place])   Past Medical History:  Past Medical History:  Diagnosis Date   Abnormality of gait 05/10/2010   BACK PAIN 11/14/2008   Class 1 obesity due to excess calories with body mass index (BMI) of 31.0 to 31.9 in adult 02/07/2021   DIABETES MELLITUS, TYPE II 07/15/2008   Diplopia 07/15/2008   ECZEMA, ATOPIC 04/03/2009   GERD (gastroesophageal reflux disease)    Guillain-Barre ([place]) 1988   HYPERLIPIDEMIA 03/06/2009   HYPERTENSION 07/15/2008   Stroke ([place]) 2010, 2011   x2    Vertebral artery stenosis    Past Surgical History:  Past Surgical History:  Procedure Laterality Date   ABDOMINAL HYSTERECTOMY     DILATION AND CURETTAGE OF UTERUS     FOOT SURGERY     IR ANGIO INTRA EXTRACRAN SEL COM CAROTID INNOMINATE UNI L MOD SED  09/17/2021   IR ANGIO INTRA EXTRACRAN SEL INTERNAL CAROTID UNI R MOD SED  09/17/2021   IR ANGIO VERTEBRAL SEL VERTEBRAL UNI R MOD SED  09/17/2021   IR US GUIDE VASC ACCESS RIGHT  09/17/2021    Assessment & Plan Clinical Impression: [name] is a 62 year old female with history of T2DM, GBS, diplopia, HTN, 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 She also reported abdominal pain that ws treated with GI cocktail. 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  UA felt to be negative for UTI. 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    Neurology felt that plavix/Pletal adequate and no need to add another anti-platelet. To refer to cardiology lipid clinic for elevated LDL despite statin/Zetia as well as hydroxyzine for nerve/odd sensation in body. BP medications resumed 02/02 and therapy ongoing. Patient noted to have cognitive deficits with mild dysarthria, dizziness with activity, left hemiparesis with left knee instability and fatigue. CIR recommended due to functional decline.   Patient currently requires max with basic self-care skills secondary to muscle weakness, decreased cardiorespiratoy endurance, impaired timing and sequencing, unbalanced muscle activation, ataxia, decreased coordination, and decreased motor planning, decreased attention to left and decreased motor planning, decreased initiation, decreased attention, decreased awareness, decreased problem solving, decreased safety awareness, decreased memory, and delayed processing, and decreased sitting balance, decreased standing balance, decreased postural control, hemiplegia, and decreased balance strategies.  Prior to hospitalization, patient could complete BADL with modified independent .  Patient will benefit from skilled intervention to decrease level of assist with basic self-care skills and increase independence with basic self-care skills prior to discharge  TBD .  Anticipate patient will require 24 hour supervision and follow up home health.  OT - End of Session Activity Tolerance: Tolerates 10 - 20 min activity with multiple rests Endurance Deficit:  Yes OT Assessment OT Barriers to Discharge: Decreased caregiver support OT  Patient demonstrates impairments in the following area(s): Balance;Cognition;Endurance;Motor;Perception;Safety OT Basic ADL's Functional Problem(s): Eating;Grooming;Bathing;Toileting;Dressing OT Transfers Functional Problem(s): Toilet;Tub/Shower OT Additional Impairment(s): Fuctional Use of Upper Extremity OT Plan OT Intensity: Minimum of 1-2 x/day, 45 to 90 minutes OT Frequency: 5 out of 7 days OT Duration/Estimated Length of Stay: 21-24 days OT Treatment/Interventions: Balance/vestibular training;Functional electrical stimulation;Self Care/advanced ADL retraining;UE/LE Coordination activities;Cognitive remediation/compensation;Functional mobility training;Skin care/wound managment;Visual/perceptual remediation/compensation;Community reintegration;Neuromuscular re-education;Splinting/orthotics;Wheelchair propulsion/positioning;Discharge planning;Pain management;Therapeutic Activities;Disease mangement/prevention;Patient/family education;Therapeutic Exercise;DME/adaptive equipment instruction;Psychosocial support;UE/LE Strength taining/ROM OT Self Feeding Anticipated Outcome(s): Indep OT Basic Self-Care Anticipated Outcome(s): Supervision OT Toileting Anticipated Outcome(s): Supervision OT Bathroom Transfers Anticipated Outcome(s): Supervision OT Recommendation Patient destination: Home Follow Up Recommendations: Home health OT Equipment Recommended: To be determined   OT Evaluation Precautions/Restrictions  Precautions Precautions: Fall Precaution Comments: Left Hemiparesis, Left knee buckles, support left arm in standing Restrictions Weight Bearing Restrictions: No Pain Pain Assessment Pain Scale: 0-10 Pain Score: 0-No pain Home Living/Prior Functioning Home Living Family/patient expects to be discharged to:: Private residence Living Arrangements: Alone Available Help at Discharge: Family Type of Home: Apartment Home Access: Stairs to enter Technical brewer of Steps:  3 Entrance Stairs-Rails: None Home Layout: One level Bathroom Shower/Tub: Optometrist: Yes  Lives With: Alone Prior Function Vocation: Retired Surveyor, mining Baseline Vision/History: 0 No visual deficits Ability to See in Adequate Light: 0 Adequate Patient Visual Report: No change from baseline Vision Assessment?: No apparent visual deficits Perception  Perception: Impaired Praxis Praxis: Impaired Praxis Impairment Details: Motor planning Cognition Cognition Overall Cognitive Status: Within Functional Limits for tasks assessed Arousal/Alertness: Awake/alert Orientation Level: Person;Place;Situation Person: Oriented Place: Oriented Situation: Oriented Memory: Impaired Awareness: Impaired Problem Solving: Appears intact Safety/Judgment: Appears intact Brief Interview for Mental Status (BIMS) Repetition of Three Words (First Attempt): 3 Temporal Orientation: Year: Correct Temporal Orientation: Month: Accurate within 5 days Temporal Orientation: Day: Correct Recall: "Sock": No, could not recall Recall: "Blue": Yes, no cue required Recall: "Bed": Yes, no cue required BIMS Summary Score: 13 Sensation Sensation Light Touch: Appears Intact Coordination Gross Motor Movements are Fluid and Coordinated: No Fine Motor Movements are Fluid and Coordinated: No Coordination and Movement Description: limited by dense L hemi Heel Shin Test: LLE flaccid Motor  Motor Motor: Hemiplegia Motor - Skilled Clinical Observations: dense L hemi for UE/LE  Trunk/Postural Assessment  Cervical Assessment Cervical Assessment: Exceptions to [facility] Thoracic Assessment Thoracic Assessment: Exceptions to [facility] Lumbar Assessment Lumbar Assessment: Exceptions to [facility] Postural Control Postural Control: Deficits on evaluation  Balance Balance Balance Assessed: Yes Static Sitting Balance Static Sitting - Balance Support: Feet unsupported Static Sitting -  Level of Assistance: 5: Stand by assistance Dynamic Sitting Balance Dynamic Sitting - Balance Support: Feet unsupported Dynamic Sitting - Level of Assistance: 3: Mod assist Dynamic Sitting - Balance Activities: Lateral lean/weight shifting;Forward lean/weight shifting;Reaching for objects Static Standing Balance Static Standing - Balance Support: During functional activity;Bilateral upper extremity supported Static Standing - Level of Assistance: 3: Mod assist Dynamic Standing Balance Dynamic Standing - Balance Support: During functional activity;Bilateral upper extremity supported Dynamic Standing - Level of Assistance: 2: Max assist Dynamic Standing - Balance Activities: Forward lean/weight shifting Extremity/Trunk Assessment RUE Assessment RUE Assessment: Within Functional Limits LUE Assessment LUE Assessment: Exceptions to [facility] Active Range of Motion (AROM) Comments: limited by dense L hemi LUE Body System: Neuro Brunstrum levels for arm and hand: Arm;Hand Brunstrum level for arm: Stage II Synergy is developing Brunstrum level  for hand: Stage II Synergy is developing  Care Tool Care Tool Self Care Eating   Eating Assist Level: Set up assist    Oral Care    Oral Care Assist Level: Minimal Assistance - Patient > 75%    Bathing   Body parts bathed by patient: Left arm;Chest;Abdomen;Front perineal area;Buttocks;Right upper leg;Left upper leg;Face Body parts bathed by helper: Left lower leg;Right lower leg;Buttocks;Right arm   Assist Level: Maximal Assistance - Patient 24 - 49%    Upper Body Dressing(including orthotics)   What is the patient wearing?: Pull over shirt   Assist Level: Maximal Assistance - Patient 25 - 49%    Lower Body Dressing (excluding footwear)   What is the patient wearing?: Pants;Incontinence brief Assist for lower body dressing: Total Assistance - Patient < 25%    Putting on/Taking off footwear   What is the patient wearing?: Non-skid slipper  socks Assist for footwear: Total Assistance - Patient < 25%       Care Tool Toileting Toileting activity   Assist for toileting: Maximal Assistance - Patient 25 - 49%     Care Tool Bed Mobility Roll left and right activity    MOD A    Sit to lying activity    MAX A    Lying to sitting on side of bed activity    MAX A     Care Tool Transfers Sit to stand transfer   Sit to stand assist level: Maximal Assistance - Patient 25 - 49%    Chair/bed transfer    MAX x2     Toilet transfer   Assist Level: Dependent - Patient 0% (STEDY)     Care Tool Cognition  Expression of Ideas and Wants Expression of Ideas and Wants: 4. Without difficulty (complex and basic) - expresses complex messages without difficulty and with speech that is clear and easy to understand  Understanding Verbal and Non-Verbal Content Understanding Verbal and Non-Verbal Content: 4. Understands (complex and basic) - clear comprehension without cues or repetitions   Memory/Recall Ability Memory/Recall Ability : Current season;That he or she is in a hospital/hospital unit   Refer to Care Plan for [place] 1 OT Short Term Goal 1 (Week 1): Pt will improve ability to perform toilet transfer with 1 helper OT Short Term Goal 2 (Week 1): Pt will perform shower level bathing with no more than MOD A OT Short Term Goal 3 (Week 1): Pt will improve functional use of LUE to grasp/release 3/3 trials OT Short Term Goal 4 (Week 1): Pt will improve sit to stands for LB ADL with MIN A  Recommendations for other services: None    Skilled Therapeutic Intervention ADL ADL Eating: Set up Grooming: Minimal assistance Upper Body Bathing: Minimal assistance Lower Body Bathing: Maximal assistance Upper Body Dressing: Maximal assistance Lower Body Dressing: Maximal assistance Toileting: Maximal assistance Toilet Transfer: Dependent Mobility  Bed Mobility Bed Mobility: Rolling Right;Rolling  Left;Right Sidelying to Sit;Sit to Supine Rolling Right: Moderate Assistance - Patient 50-74% Rolling Left: Moderate Assistance - Patient 50-74% Right Sidelying to Sit: Moderate Assistance - Patient 50-74% Sit to Supine: Moderate Assistance - Patient 50-74% Transfers Sit to Stand: Moderate Assistance - Patient 50-74% Stand to Sit: Maximal Assistance - Patient 25-49%   Skilled Interventions: Pt greeted at time of session up in wheelchair from PT session, agreeable to OT. OT discussing role and purpose of OT as well as goals and POC. Pt performing ADL routine for  sink level bathing and dressing, pt needing MAX A for sit to stands at sink level, OT blocking L knee. Cues for upright standing throughout all sit to stands and mirror for feedback. Pt able to wash periarea and buttocks in standing as well with MAX A for standing balance and weight bearing through LUE on sink surface. Pt performing UB dress MAX A and LB dress max/total A and educated on hemitechniques. Educated as well on sublux prevention and techniques to promote return for LUE. No 2+ at this time, stedy with MIN A for sit to stand stedy transfer to recliner. LUE elevated, all needs met and alarm on.    Discharge Criteria: Patient will be discharged from OT if patient refuses treatment 3 consecutive times without medical reason, if treatment goals not met, if there is a change in medical status, if patient makes no progress towards goals or if patient is discharged from hospital.  The above assessment, treatment plan, treatment alternatives and goals were discussed and mutually agreed upon: by patient  Viona Gilmore 06/05/2022, 12:25 PM

## 2022-06-05 NOTE — Progress Notes (Signed)
PROGRESS NOTE   Subjective/Complaints:  No events overnight. Complains of poor sleep d./t feeling like she is "sinking into the bed". Otherwise, doing well, no concerns.   ROS: +difficulty sleeping Denies fevers, chills, N/V, abdominal pain, constipation, diarrhea, SOB, cough, chest pain, new weakness or paraesthesias.    Objective:   No results found. Recent Labs    06/05/22 0634  WBC 6.8  HGB 10.9*  HCT 32.6*  PLT 246   Recent Labs    06/03/22 0456 06/05/22 0634  NA 137 136  K 3.5 3.8  CL 106 110  CO2 23 21*  GLUCOSE 91 125*  BUN 24* 26*  CREATININE 1.22* 1.35*  CALCIUM 9.5 9.0    Intake/Output Summary (Last 24 hours) at 06/05/2022 2209 Last data filed at 06/05/2022 1257 Gross per 24 hour  Intake 480 ml  Output --  Net 480 ml        Physical Exam: Vital Signs Blood pressure 137/60, pulse 60, temperature 98.2 F (36.8 C), resp. rate 18, height '5\' 4"'$  (1.626 m), weight 82.2 kg, SpO2 95 %.  Constitutional: No apparent distress. Appropriate appearance for age. +obese.  HENT: No JVD.   Atraumatic, normocephalic. Eyes: PERRLA. EOMI.   Cardiovascular: RRR, no murmurs/rub/gallops. No Edema.   Respiratory: CTAB. No rales, rhonchi, or wheezing. On RA.  Abdomen: + bowel sounds, normoactive. No distention or tenderness.  Skin: C/D/I. No apparent lesions. MSK:      No apparent deformity.      Strength: RUE and RLE 5/5                 LUE: 4-/5 SA, 3-/5 EF, 3-/5 EE, 1/5 WE, 2/5 FF, 1/5 FA - improved                LLE:  3-/5 HF, 2-/5 KE, 0/5 DF/EHL/ PF    Neurologic exam:  Cognition: AAO to person, place, time and event.  Language: Fluent, No substitutions or neoglisms. + mild dysarthria.  Insight: Good  insight into current condition.  Mood: Pleasant affect, appropriate mood.  Sensation: To light touch intact in BL Ues; decreased in L foot CN: + L tongue deviation + L shoulder weakness Tone: no apparent  spasticity     Assessment/Plan: 1. Functional deficits which require 3+ hours per day of interdisciplinary therapy in a comprehensive inpatient rehab setting. Physiatrist is providing close team supervision and 24 hour management of active medical problems listed below. Physiatrist and rehab team continue to assess barriers to discharge/monitor patient progress toward functional and medical goals  Care Tool:  Bathing    Body parts bathed by patient: Left arm, Chest, Abdomen, Front perineal area, Buttocks, Right upper leg, Left upper leg, Face   Body parts bathed by helper: Left lower leg, Right lower leg, Buttocks, Right arm     Bathing assist Assist Level: Maximal Assistance - Patient 24 - 49%     Upper Body Dressing/Undressing Upper body dressing   What is the patient wearing?: Pull over shirt    Upper body assist Assist Level: Maximal Assistance - Patient 25 - 49%    Lower Body Dressing/Undressing Lower body dressing  What is the patient wearing?: Pants, Incontinence brief     Lower body assist Assist for lower body dressing: Total Assistance - Patient < 25%     Toileting Toileting    Toileting assist Assist for toileting: Maximal Assistance - Patient 25 - 49%     Transfers Chair/bed transfer  Transfers assist     Chair/bed transfer assist level: 2 Helpers     Locomotion Ambulation   Ambulation assist   Ambulation activity did not occur: Safety/medical concerns (limited 2/2 LLE deficits)          Walk 10 feet activity   Assist  Walk 10 feet activity did not occur: Safety/medical concerns (limited 2/2 LLE deficits)        Walk 50 feet activity   Assist Walk 50 feet with 2 turns activity did not occur: Safety/medical concerns (limited 2/2 LLE deficits)         Walk 150 feet activity   Assist Walk 150 feet activity did not occur: Safety/medical concerns (limited 2/2 LLE deficits)         Walk 10 feet on uneven surface   activity   Assist Walk 10 feet on uneven surfaces activity did not occur: Safety/medical concerns (limited 2/2 LLE deficits)         Wheelchair     Assist Is the patient using a wheelchair?: No   Wheelchair activity did not occur: N/A         Wheelchair 50 feet with 2 turns activity    Assist    Wheelchair 50 feet with 2 turns activity did not occur: N/A       Wheelchair 150 feet activity     Assist  Wheelchair 150 feet activity did not occur: N/A       Blood pressure 137/60, pulse 60, temperature 98.2 F (36.8 C), resp. rate 18, height '5\' 4"'$  (1.626 m), weight 82.2 kg, SpO2 95 %. Medical Problem List and Plan: 1. Functional deficits secondary to acute right medullary CVA              -patient may  shower             -ELOS/Goals: 14-16 days; min assist PT, supervision and min assist OT, supervision and min assist SLP  2.  Antithrombotics: -DVT/anticoagulation:  Pharmaceutical: Lovenox             -antiplatelet therapy: Plavix/Pletal 3. Pain Management:  Tylenol prn.  4. Mood/Behavior/Sleep:  LCSW to follow for evaluation and support.              -antipsychotic agents: N/A             --trazodone prn for insomnia.  5. Neuropsych/cognition: This patient is capable of making decisions on her own behalf. 6. Skin/Wound Care: Routine pressure relief measures.  7. Fluids/Electrolytes/Nutrition: Monitor I/O. Check CMET in am - stable AM labs  8. HTN; Monitor BP TID--avoid hypoperfusion.  --continue hydralazine, losartan, amlodipine and metoprolol - Mild HTN; monitor with therapies     06/05/2022    8:01 PM 06/05/2022    1:26 PM 06/05/2022    7:36 AM  Vitals with BMI  Systolic [phone]  Diastolic 60 70 71  Pulse 60 58 63      9. T2DM w/peripheral neuroapthy: Hgb A1C- 11.4 (improving >14 11/23) Poorly controlled.              --was on Trulicity weekly, lantus 30 units and novolog 8 units TID ac. --Continue  insulin glargine 15 units BID with 5 units  meal coverage TID. Titrate as indicated.              --am hypoglycemia noted. Will change SSI to sensitive.    - 2/4: mostly good range with occasional 200s; monitor for now Recent Labs    06/05/22 1122 06/05/22 1650 06/05/22 2101  GLUCAP 215* 149* 121*     10. Elevated lipids: On Lipitor and Zetia.  11. PVD: On pletal and atorvastatin. 12. H/o depression/anxiety: Continue Prozax with xanax/hydroxyzine prn.  13. Obesity: BMI-30. Educate on diet and wt loss to help promote health and mobility.  14. CKD: Baseline SCr 1.3-1.4. Avoid nephrotoxic agents.              --note rise in BUN--encourage fluid intake. Monitor with serial checks.    - 2/4: Cr 1.3 on intake; baseline  15. Dyslipidemia: Continue Zetia/Lipitor.  --Refer to cardiology for  PCSK9? (Has seen Dr. [name] for PAD)      LOS: 1 days A FACE TO [place] 06/05/2022, 10:09 PM

## 2022-06-05 NOTE — Plan of Care (Signed)
  Problem: RH Balance Goal: LTG: Patient will maintain dynamic sitting balance (OT) Description: LTG:  Patient will maintain dynamic sitting balance with assistance during activities of daily living (OT) Flowsheets (Taken 06/05/2022 1237) LTG: Pt will maintain dynamic sitting balance during ADLs with: Independent Goal: LTG Patient will maintain dynamic standing with ADLs (OT) Description: LTG:  Patient will maintain dynamic standing balance with assist during activities of daily living (OT)  Flowsheets (Taken 06/05/2022 1237) LTG: Pt will maintain dynamic standing balance during ADLs with: Supervision/Verbal cueing   Problem: Sit to Stand Goal: LTG:  Patient will perform sit to stand in prep for activites of daily living with assistance level (OT) Description: LTG:  Patient will perform sit to stand in prep for activites of daily living with assistance level (OT) Flowsheets (Taken 06/05/2022 1237) LTG: PT will perform sit to stand in prep for activites of daily living with assistance level: Supervision/Verbal cueing   Problem: RH Eating Goal: LTG Patient will perform eating w/assist, cues/equip (OT) Description: LTG: Patient will perform eating with assist, with/without cues using equipment (OT) Flowsheets (Taken 06/05/2022 1237) LTG: Pt will perform eating with assistance level of: Independent with assistive device    Problem: RH Grooming Goal: LTG Patient will perform grooming w/assist,cues/equip (OT) Description: LTG: Patient will perform grooming with assist, with/without cues using equipment (OT) Flowsheets (Taken 06/05/2022 1237) LTG: Pt will perform grooming with assistance level of: Supervision/Verbal cueing   Problem: RH Bathing Goal: LTG Patient will bathe all body parts with assist levels (OT) Description: LTG: Patient will bathe all body parts with assist levels (OT) Flowsheets (Taken 06/05/2022 1237) LTG: Pt will perform bathing with assistance level/cueing: Supervision/Verbal  cueing   Problem: RH Dressing Goal: LTG Patient will perform upper body dressing (OT) Description: LTG Patient will perform upper body dressing with assist, with/without cues (OT). Flowsheets (Taken 06/05/2022 1237) LTG: Pt will perform upper body dressing with assistance level of: Supervision/Verbal cueing Goal: LTG Patient will perform lower body dressing w/assist (OT) Description: LTG: Patient will perform lower body dressing with assist, with/without cues in positioning using equipment (OT) Flowsheets (Taken 06/05/2022 1237) LTG: Pt will perform lower body dressing with assistance level of: Supervision/Verbal cueing   Problem: RH Toileting Goal: LTG Patient will perform toileting task (3/3 steps) with assistance level (OT) Description: LTG: Patient will perform toileting task (3/3 steps) with assistance level (OT)  Flowsheets (Taken 06/05/2022 1237) LTG: Pt will perform toileting task (3/3 steps) with assistance level: Supervision/Verbal cueing   Problem: RH Functional Use of Upper Extremity Goal: LTG Patient will use RT/LT upper extremity as a (OT) Description: LTG: Patient will use right/left upper extremity as a stabilizer/gross assist/diminished/nondominant/dominant level with assist, with/without cues during functional activity (OT) Flowsheets (Taken 06/05/2022 1237) LTG: Use of upper extremity in functional activities: LUE as nondominant level LTG: Pt will use upper extremity in functional activity with assistance level of: Supervision/Verbal cueing   Problem: RH Toilet Transfers Goal: LTG Patient will perform toilet transfers w/assist (OT) Description: LTG: Patient will perform toilet transfers with assist, with/without cues using equipment (OT) Flowsheets (Taken 06/05/2022 1237) LTG: Pt will perform toilet transfers with assistance level of: Supervision/Verbal cueing   Problem: RH Tub/Shower Transfers Goal: LTG Patient will perform tub/shower transfers w/assist (OT) Description:  LTG: Patient will perform tub/shower transfers with assist, with/without cues using equipment (OT) Flowsheets (Taken 06/05/2022 1237) LTG: Pt will perform tub/shower stall transfers with assistance level of: Supervision/Verbal cueing

## 2022-06-05 NOTE — Evaluation (Signed)
Speech Language Pathology Assessment and Plan  Patient Details  Name: [name] MRN: [id number] Date of Birth: 06-Aug-1960  SLP Diagnosis: Cognitive Impairments  Rehab Potential: Excellent ELOS: 1-2 additional visits    Today's Date: 06/05/2022 SLP Individual Time: 1400-1500 SLP Individual Time Calculation (min): 11 min   Hospital Problem: Principal Problem:   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[place])  Past Medical History:  Past Medical History:  Diagnosis Date   Abnormality of gait 05/10/2010   BACK PAIN 11/14/2008   Class 1 obesity due to excess calories with body mass index (BMI) of 31.0 to 31.9 in adult 02/07/2021   DIABETES MELLITUS, TYPE II 07/15/2008   Diplopia 07/15/2008   ECZEMA, ATOPIC 04/03/2009   GERD (gastroesophageal reflux disease)    Guillain-Barre ([place]) 1988   HYPERLIPIDEMIA 03/06/2009   HYPERTENSION 07/15/2008   Stroke ([place]) 2010, 2011   x2    Vertebral artery stenosis    Past Surgical History:  Past Surgical History:  Procedure Laterality Date   ABDOMINAL HYSTERECTOMY     DILATION AND CURETTAGE OF UTERUS     FOOT SURGERY     IR ANGIO INTRA EXTRACRAN SEL COM CAROTID INNOMINATE UNI L MOD SED  09/17/2021   IR ANGIO INTRA EXTRACRAN SEL INTERNAL CAROTID UNI R MOD SED  09/17/2021   IR ANGIO VERTEBRAL SEL VERTEBRAL UNI R MOD SED  09/17/2021   IR US GUIDE VASC ACCESS RIGHT  09/17/2021    Assessment / Plan / Recommendation Clinical Impression  [name] is a 62 year old female with history of T2DM, GBS, diplopia, HTN, 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 She also reported abdominal pain that ws treated with GI cocktail. 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  UA felt to be negative for UTI. 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    Neurology felt that plavix/Pletal adequate and no need to add another anti-platelet. To refer to cardiology lipid clinic for elevated LDL despite statin/Zetia as well as hydroxyzine for nerve/odd sensation in body. BP medications resumed 02/02 and therapy ongoing. Patient noted to have cognitive deficits with mild dysarthria, dizziness with activity, left hemiparesis with left knee instability and fatigue. CIR recommended due to functional decline. SLP evaluation was completed on 06/05/22 with results as follows:  Pt scored WFL on all subtests of the [employer]ogn[employer] standardized cognitive assessment.  Her speech is fluent and free from word finding impairment.  Her articulation is at times imprecise but pt attributes this to missing front teeth and being present prior to acute CVA.  She is overall pleased with her ability to communicate with family and caregivers.  SLP provided education regarding opportunities for SLP follow up post discharge from CIR should pt notice difficulty with cognition or communication in the home environment [facility] or OP).   Despite no obvious cognitive-communication challenges noted on today's evaluation, pt expressed that she would prefer SLP to come back for 1-2 additional visits to assess higher level cognitive skills given that she felt certain tasks were more effortful or took her long than she expected to complete.  As a result, pt would benefit  from skilled ST while inpatient in order to maximize functional independence and reduce burden of care prior to discharge.  Do not anticipate ST needs post discharge.     Skilled Therapeutic Interventions          Cognitive-linguistic evaluation completed with results and  recommendations reviewed with patient    SLP Assessment  Patient will need skilled [facility] during CIR admission    Recommendations  Patient destination: Home Follow up Recommendations: None Equipment Recommended: None recommended by SLP    SLP Frequency 1 to 3 out of 7 days   SLP Duration  SLP Intensity  SLP Treatment/Interventions 1-2 additional visits  Minumum of 1-2 x/day, 30 to 90 minutes  Cognitive remediation/compensation;Functional tasks;Patient/family education    Pain Pain Assessment Pain Scale: 0-10 Pain Score: 0-No pain  Prior Functioning Cognitive/Linguistic Baseline: Within functional limits Type of Home: Apartment  Lives With: Alone Available Help at Discharge: Family Vocation: Retired  Programmer, systems Overall Cognitive Status: Impaired/Different from baseline Comments: Pt and family would like ongoing diagnostic treatment of higher level cognitive skills, pt periodically needed increased time than she typically expected of herself to complete tasks  Comprehension Auditory Comprehension Overall Auditory Comprehension: Appears within functional limits for tasks assessed Expression Expression Primary Mode of Expression: Verbal Verbal Expression Overall Verbal Expression: Appears within functional limits for tasks assessed Oral Motor Oral Motor/Sensory Function Overall Oral Motor/Sensory Function: Within functional limits  Care Tool Care Tool Cognition Ability to hear (with hearing aid or hearing appliances if normally used Ability to hear (with hearing aid or hearing appliances if normally used): 0. Adequate - no difficulty in normal conservation, social interaction, listening to TV   Expression of Ideas and Wants Expression of Ideas and Wants: 4. Without difficulty (complex and basic) - expresses complex messages without difficulty and with speech that is clear and easy to understand   Understanding Verbal and  Non-Verbal Content Understanding Verbal and Non-Verbal Content: 4. Understands (complex and basic) - clear comprehension without cues or repetitions  Memory/Recall Ability Memory/Recall Ability : Current season;That he or she is in a hospital/hospital unit     Short Term Goals: Week 1: SLP Short Term Goal 1 (Week 1): Pt will complete medication management tasks for 100% accuracy with mod I SLP Short Term Goal 2 (Week 1): Pt will complete money management tasks for >75% accuracy with mod I  Refer to Care Plan for Long Term Goals  Recommendations for other services: None   Discharge Criteria: Patient will be discharged from SLP if patient refuses treatment 3 consecutive times without medical reason, if treatment goals not met, if there is a change in medical status, if patient makes no progress towards goals or if patient is discharged from hospital.  The above assessment, treatment plan, treatment alternatives and goals were discussed and mutually agreed upon: by patient  Emilio Math 06/05/2022, 4:23 PM

## 2022-06-06 DIAGNOSIS — D649 Anemia, unspecified: Secondary | ICD-10-CM

## 2022-06-06 DIAGNOSIS — E1142 Type 2 diabetes mellitus with diabetic polyneuropathy: Secondary | ICD-10-CM | POA: Diagnosis not present

## 2022-06-06 DIAGNOSIS — N184 Chronic kidney disease, stage 4 (severe): Secondary | ICD-10-CM | POA: Diagnosis not present

## 2022-06-06 DIAGNOSIS — I10 Essential (primary) hypertension: Secondary | ICD-10-CM

## 2022-06-06 DIAGNOSIS — Z794 Long term (current) use of insulin: Secondary | ICD-10-CM

## 2022-06-06 DIAGNOSIS — I639 Cerebral infarction, unspecified: Secondary | ICD-10-CM | POA: Diagnosis not present

## 2022-06-06 LAB — GLUCOSE, CAPILLARY
Glucose-Capillary: 109 mg/dL — ABNORMAL HIGH (ref 70–99)
Glucose-Capillary: 115 mg/dL — ABNORMAL HIGH (ref 70–99)
Glucose-Capillary: 189 mg/dL — ABNORMAL HIGH (ref 70–99)
Glucose-Capillary: 222 mg/dL — ABNORMAL HIGH (ref 70–99)

## 2022-06-06 NOTE — IPOC Note (Signed)
Overall Plan of Care [facility]) Patient Details Name: [name] MRN: [id number] DOB: Mar 27, 1961  Admitting Diagnosis: Acute stroke of medulla oblongata [facility])  Hospital Problems: Principal Problem:   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[place])     Functional Problem List: Nursing Sensory, Endurance, Medication Management, Motor, Pain, Perception, Safety  PT Balance, Endurance, Motor, Safety  OT Balance, Cognition, Endurance, Motor, Perception, Safety  SLP Cognition  TR         Basic ADL's: OT Eating, Grooming, Bathing, Toileting, Dressing     Advanced  ADL's: OT       Transfers: PT Bed Mobility, Bed to Chair, Teacher, early years/pre, Tub/Shower     Locomotion: PT Ambulation, Stairs     Additional Impairments: OT Fuctional Use of Upper Extremity  SLP Social Cognition   Problem Solving  TR      Anticipated Outcomes Item Anticipated Outcome  Self Feeding Indep  Swallowing      Basic self-care  Supervision  Toileting  Supervision   Bathroom Transfers Supervision  Bowel/Bladder  Continent B/B  Transfers  supervision  Locomotion  CGA  Communication     Cognition  mod I  Pain  less than 4  Safety/Judgment  remain fall free while in rehab   Therapy Plan: PT Intensity: Minimum of 1-2 x/day ,45 to 90 minutes PT Frequency: 5 out of 7 days PT Duration Estimated Length of Stay: 21-24 days OT Intensity: Minimum of 1-2 x/day, 45 to 90 minutes OT Frequency: 5 out of 7 days OT Duration/Estimated Length of Stay: 18-21 days SLP Intensity: Minumum of 1-2 x/day, 30 to 90 minutes SLP Frequency: 1 to 3 out of 7 days SLP Duration/Estimated Length of Stay: 1-2 additional visits   Team Interventions: Nursing Interventions Patient/Family Education, Medication Management, Psychosocial Support, Disease Management/Prevention, Pain Management, Discharge Planning  PT interventions Ambulation/gait training, Balance/vestibular training, Cognitive remediation/compensation, Discharge  planning, Community reintegration, DME/adaptive equipment instruction, Functional electrical stimulation, Functional mobility training, Neuromuscular re-education, Pain management, Patient/family education, Stair training, Splinting/orthotics, Therapeutic Activities, Psychosocial support, Therapeutic Exercise, UE/LE Strength taining/ROM, UE/LE Coordination activities  OT Interventions Balance/vestibular training, Functional electrical stimulation, Self Care/advanced ADL retraining, UE/LE Coordination activities, Cognitive remediation/compensation, Functional mobility training, Skin care/wound managment, Visual/perceptual remediation/compensation, Community reintegration, Neuromuscular re-education, Splinting/orthotics, Wheelchair propulsion/positioning, Discharge planning, Pain management, Therapeutic Activities, Disease mangement/prevention, Patient/family education, Therapeutic Exercise, DME/adaptive equipment instruction, Psychosocial support, UE/LE Strength taining/ROM  SLP Interventions Cognitive remediation/compensation, Functional tasks, Patient/family education  TR Interventions    SW/CM Interventions Discharge Planning, Psychosocial Support, Patient/Family Education, Disease Management/Prevention   Barriers to Discharge MD  Medical stability and Home enviroment access/loayout  Nursing Decreased caregiver support, Weight, Medication compliance lives alone; appt with level entry. friends and family to provide 24/7 care  PT Home environment access/layout 3 STE  OT Decreased caregiver support    SLP      SW Inaccessible home environment, Decreased caregiver support, Insurance for SNF coverage, Lack of/limited family support Sister works   International aid/development worker: Destination: PT-Home ,OT- Home , SLP-Home Projected Follow-up: PT-Home health PT, OT-  Home health OT, SLP-None Projected Equipment Needs: PT-To be determined, OT- To be determined, SLP-None recommended by SLP Equipment Details:  PT-TBD on LRAD, OT-  Patient/family involved in discharge planning: PT- Patient,  OT-Patient, SLP-Patient, Family member/caregiver  MD ELOS: 14-16 Medical Rehab Prognosis:  Good Assessment: The patient has been admitted for CIR therapies with the diagnosis of acute right medullary CVA  . The team will be addressing functional mobility, strength, stamina, balance,  safety, adaptive techniques and equipment, self-care, bowel and bladder mgt, patient and caregiver education. Goals have been set at [place] pt, sup to min A OT, sup to min A slp. Anticipated discharge destination is home.        See Team Conference Notes for weekly updates to the plan of care

## 2022-06-06 NOTE — Progress Notes (Signed)
Physical Therapy Session Note  Patient Details  Name: [name] MRN: [id number] Date of Birth: [birth year]  Today's Date: 06/06/2022 PT Individual Time: 0936-1004 PT Individual Time Calculation (min): 28 min   Short Term Goals: Week 1:  PT Short Term Goal 1 (Week 1): Pt will complete bed to chair transfer with ModA + 1 PT Short Term Goal 2 (Week 1): Pt will complete supine <> sit with CGA PT Short Term Goal 3 (Week 1): Pt will ambulate 10 ft with LRAD PT Short Term Goal 4 (Week 1): Pt will complete sit to stand with CGA  + LRAD  Skilled Therapeutic Interventions/Progress Updates:  Patient seated upright in w/c on entrance to room. Patient alert and agreeable to PT session.   Patient with no pain complaint at start of session. Wants to be able to move her L arm and leg better. No activation to trace activation seen for knee extension and hip flexion in seated position.  Neuromuscular Re-ed: NMR facilitated during session with focus on standing balance, LLE muscle activation, LUE muscle activation. Pt guided in blocked practice of power up and sit<>stands and then minisquats. Pt improves rise to stand from [place] to [place]. Good activation of LLE with minisquats.   LUE guided in AROM shoulder shrugs, AAROM scap sets with 5sec holds, AAROM with shoulder extension, then flexion, AAROM for elbow flexion/ ext, then AROM for supination and AAROM for pronation.  NMR performed for improvements in motor control and coordination, balance, sequencing, judgement, and self confidence/ efficacy in performing all aspects of mobility at highest level of independence.   Therapeutic Activity: Transfers: Pt performed sit<>stand transfers from w/c to [facility] with Cassandra initially and improving to MinA. Squat pivot transfer to R side from w/c to recliner with overall MinA and vc for technique.   Patient seated upright in recliner with BLE elevated and L arm supported at end of session with brakes locked, pad alarm  set, and all needs within reach on tray table in front of her.   Therapy Documentation Precautions:  Precautions Precautions: Fall Precaution Comments: Left Hemiparesis, Left knee buckles, support left arm in standing Restrictions Weight Bearing Restrictions: No General:   Vital Signs:  Pain: Pain Assessment Pain Scale: 0-10 Pain Score: 0-No pain  Therapy/Group: Individual Therapy  Alger Simons PT, DPT, CSRS 06/06/2022, 10:25 AM

## 2022-06-06 NOTE — Progress Notes (Signed)
Patient ID: [name], female   DOB: [birth year], 62 y.o.   MRN: [id number] Met with the patient and spoke with sister Hassan Rowan via phone to review current situation, rehab process, team conference and plan of care. Reviewed medications and dietary modification recommendations including HTN, HLD, DM (A1C 11.4), previous strokes. Reports taking meds as ordered but not following a HH/CMM diet. Discussed adding a protein with starchy foods and reviewed CMM diet.  Sister asking about resources available to patient/programs post CIR with limited funds and if she needed help with ADLs at discharge.  Concerns forwarded to Education officer, museum.  Provided written materials of information reviewed verbally. Continue to follow along to address educational needs to facilitate preparation for discharge. Margarito Liner

## 2022-06-06 NOTE — Progress Notes (Addendum)
PROGRESS NOTE   Subjective/Complaints:  No new concerns this Am. Sitting in bedside chair. Denies pain.   ROS:  Denies fevers, chills, N/V, abdominal pain, constipation, diarrhea, SOB, cough, chest pain, HA, new weakness or paraesthesias.    Objective:   No results found. Recent Labs    06/05/22 0634  WBC 6.8  HGB 10.9*  HCT 32.6*  PLT 246    Recent Labs    06/05/22 0634  NA 136  K 3.8  CL 110  CO2 21*  GLUCOSE 125*  BUN 26*  CREATININE 1.35*  CALCIUM 9.0     Intake/Output Summary (Last 24 hours) at 06/06/2022 0857 Last data filed at 06/06/2022 0840 Gross per 24 hour  Intake 598 ml  Output --  Net 598 ml         Physical Exam: Vital Signs Blood pressure (!) 154/84, pulse 73, temperature 98 F (36.7 C), resp. rate 17, height '5\' 4"'$  (1.626 m), weight 82.2 kg, SpO2 98 %.  Constitutional: No apparent distress. Appropriate appearance for age. +obese.  HENT: No JVD.   Atraumatic, normocephalic. Eyes: PERRLA. Conjugate gaze Cardiovascular: RRR, no murmurs/rub/gallops. No Edema.   Respiratory: CTAB. No rales, rhonchi, or wheezing. On RA.  Abdomen: + bowel sounds, normoactive. No distention or tenderness.  Skin: C/D/I. No apparent lesions. MSK:      No apparent deformity.      Strength: RUE and RLE 5/5                 LUE: 4-/5 SA, 3-/5 EF, 3-/5 EE, 1/5 WE, 2/5 FF, 1/5 FA - improved                LLE:  3-/5 HF, 2-/5 KE, 0/5 DF/EHL/ PF    Neurologic exam:  Cognition: AAO to person, place, time and event.  Language: Fluent, No substitutions or neoglisms. + mild dysarthria.  Insight: Good  insight into current condition.  Mood: Pleasant affect, appropriate mood.  Sensation: To light touch intact in BL Ues; decreased in L foot CN: + L tongue deviation + L shoulder weakness Tone: no apparent spasticity     Assessment/Plan: 1. Functional deficits which require 3+ hours per day of interdisciplinary  therapy in a comprehensive inpatient rehab setting. Physiatrist is providing close team supervision and 24 hour management of active medical problems listed below. Physiatrist and rehab team continue to assess barriers to discharge/monitor patient progress toward functional and medical goals  Care Tool:  Bathing    Body parts bathed by patient: Left arm, Chest, Abdomen, Front perineal area, Buttocks, Right upper leg, Left upper leg, Face   Body parts bathed by helper: Left lower leg, Right lower leg, Buttocks, Right arm     Bathing assist Assist Level: Maximal Assistance - Patient 24 - 49%     Upper Body Dressing/Undressing Upper body dressing   What is the patient wearing?: Pull over shirt    Upper body assist Assist Level: Maximal Assistance - Patient 25 - 49%    Lower Body Dressing/Undressing Lower body dressing      What is the patient wearing?: Pants, Incontinence brief     Lower body assist Assist for  lower body dressing: Total Assistance - Patient < 25%     Toileting Toileting    Toileting assist Assist for toileting: Maximal Assistance - Patient 25 - 49%     Transfers Chair/bed transfer  Transfers assist     Chair/bed transfer assist level: 2 Helpers     Locomotion Ambulation   Ambulation assist   Ambulation activity did not occur: Safety/medical concerns (limited 2/2 LLE deficits)          Walk 10 feet activity   Assist  Walk 10 feet activity did not occur: Safety/medical concerns (limited 2/2 LLE deficits)        Walk 50 feet activity   Assist Walk 50 feet with 2 turns activity did not occur: Safety/medical concerns (limited 2/2 LLE deficits)         Walk 150 feet activity   Assist Walk 150 feet activity did not occur: Safety/medical concerns (limited 2/2 LLE deficits)         Walk 10 feet on uneven surface  activity   Assist Walk 10 feet on uneven surfaces activity did not occur: Safety/medical concerns (limited 2/2  LLE deficits)         Wheelchair     Assist Is the patient using a wheelchair?: No   Wheelchair activity did not occur: N/A         Wheelchair 50 feet with 2 turns activity    Assist    Wheelchair 50 feet with 2 turns activity did not occur: N/A       Wheelchair 150 feet activity     Assist  Wheelchair 150 feet activity did not occur: N/A       Blood pressure (!) 154/84, pulse 73, temperature 98 F (36.7 C), resp. rate 17, height '5\' 4"'$  (1.626 m), weight 82.2 kg, SpO2 98 %. Medical Problem List and Plan: 1. Functional deficits secondary to acute right medullary CVA              -patient may  shower             -ELOS/Goals: 14-16 days; min assist PT, supervision and min assist OT, supervision and min assist SLP   -Continue CIR, PT/OT/SLP 2.  Antithrombotics: -DVT/anticoagulation:  Pharmaceutical: Lovenox             -antiplatelet therapy: Plavix/Pletal 3. Pain Management:  Tylenol prn.  4. Mood/Behavior/Sleep:  LCSW to follow for evaluation and support.              -antipsychotic agents: N/A             --trazodone prn for insomnia.  5. Neuropsych/cognition: This patient is capable of making decisions on her own behalf. 6. Skin/Wound Care: Routine pressure relief measures.  7. Fluids/Electrolytes/Nutrition: Monitor I/O. Check CMET in am - stable AM labs  8. HTN; Monitor BP TID--avoid hypoperfusion.  --continue hydralazine, losartan, amlodipine and metoprolol - Mild HTN; monitor with therapies -2/5 fair control, continue to monitor      06/06/2022    5:07 AM 06/05/2022    8:01 PM 06/05/2022    1:26 PM  Vitals with BMI  Systolic [phone]  Diastolic 84 60 70  Pulse 73 60 58      9. T2DM w/peripheral neuroapthy: Hgb A1C- 11.4 (improving >14 11/23) Poorly controlled.              --was on Trulicity weekly, lantus 30 units and novolog 8 units TID ac. --Continue insulin glargine 15 units BID  with 5 units meal coverage TID. Titrate as indicated.               --am hypoglycemia noted. Will change SSI to sensitive.    - 2/5 well controlled overall Recent Labs    06/05/22 1650 06/05/22 2101 06/06/22 0611  GLUCAP 149* 121* 109*      10. Elevated lipids: On Lipitor and Zetia.  11. PVD: On pletal and atorvastatin. 12. H/o depression/anxiety: Continue Prozax with xanax/hydroxyzine prn.  13. Obesity: BMI-30. Educate on diet and wt loss to help promote health and mobility.  14. CKD: Baseline SCr 1.3-1.4. Avoid nephrotoxic agents.              --note rise in BUN--encourage fluid intake. Monitor with serial checks.    - 2/5: Cr 1.35/BUN 26, around her baseline, continue to monitor   15. Dyslipidemia: Continue Zetia/Lipitor.  --Refer to cardiology for  PCSK9? (Has seen Dr. [name] for PAD) 57. Anemia   -HGB 10.9, stable, continue to monitor      LOS: 2 days A FACE TO FACE EVALUATION WAS PERFORMED  Jennye Boroughs 06/06/2022, 8:57 AM

## 2022-06-06 NOTE — Progress Notes (Signed)
Inpatient Rehabilitation  Patient information reviewed and entered into eRehab system by Rossanna Spitzley M. Kaliq Lege, M.A., CCC/SLP, PPS Coordinator.  Information including medical coding, functional ability and quality indicators will be reviewed and updated through discharge.    

## 2022-06-06 NOTE — Progress Notes (Signed)
Occupational Therapy Session Note  Patient Details  Name: [name] MRN: [id number] Date of Birth: [birth year]  Today's Date: 06/06/2022 OT Individual Time: 5732-2025; 4270-6237 OT Individual Time Calculation (min): 59 min, 74 min     Short Term Goals: Week 1:  OT Short Term Goal 1 (Week 1): Pt will improve ability to perform toilet transfer with 1 helper OT Short Term Goal 2 (Week 1): Pt will perform shower level bathing with no more than MOD A OT Short Term Goal 3 (Week 1): Pt will improve functional use of LUE to grasp/release 3/3 trials OT Short Term Goal 4 (Week 1): Pt will improve sit to stands for LB ADL with MIN A  Skilled Therapeutic Interventions/Progress Updates:      Therapy Documentation Precautions:  Precautions Precautions: Fall Precaution Comments: Left Hemiparesis, Left knee buckles, support left arm in standing Restrictions Weight Bearing Restrictions: No General:   Vital Signs: Therapy Vitals Temp: 98 F (36.7 C) Pulse Rate: 73 Resp: 17 BP: (!) 154/84 Patient Position (if appropriate): Lying Oxygen Therapy SpO2: 98 % O2 Device: Room Air Pain:No c/o pain.    SEG:BTDVVOH seen for am ADL tasks. Patient received resting in bed. Motivated to get OOB and participate with therapy. Patient assisted to EOB with mod assist. Sat at the EOB for bathing tasks. Needed assist for washing RUE, back and buttocks, but able to wash all other areas without assist. Worked on hemi dressing education. Gave verbal instruction followed with hand over hand assist to don sleeve up to the elbow on the left first. Needed assist to complete donning shirt over the RUE and her head, but able to manage clothing down behind her back. Continued with LE dressing. Verbal instruction given, but patient needed total assist to don pant over the left LE. Good- balance for donning pant over the RLE from the EOB. Stood with hemi walker Min assist. Mod assist provided to manage pants over hips.  Continued with transfer to the w/c with Mod assist and grooming tasks seated from the w/c requiring Min-Mod assist. Will do well using the LUE as a dependent stabilizer. Continue with hemi ADL technique training. ADL Eating: Set up Grooming: Minimal assistance Upper Body Bathing: Minimal assistance Lower Body Bathing: Maximal assistance Upper Body Dressing: Maximal assistance Lower Body Dressing: Maximal assistance Toileting: Maximal assistance Toilet Transfer: Dependent Vision- Reports occasional double vision but says it resolves quickly with focus.      Other Treatments: Second treatment focused on LUE neuro reeducation. Patient transferred from w/c to therapy mat for mobilization of scapula and GH joint. Patient with full ROM without complaint or report of stiffness. Worked on EOM wt bearing tasks to engage triceps. Moderate muscle belly cuing and facilitation to achieve full elbow extension with wt bearing. No report of discomfort through wrist. Continued with forward pre reach wt bearing working to limit over activation of pecs causing adduction vs pre-reach forward push. Able to perform x 3 before fatigue limited movement quality. Continued training at table top educating patient on various AAROM tasks she can perform in the room to work on extension, prereach and joint mobility. Patient with good response to education. Very motivated to work on her neuro recovery outside of scheduled therapy times. Continue with skilled OT POC to achieve maximal functional return to the left side.      Therapy/Group: Individual Therapy  Hermina Barters 06/06/2022, 12:33 PM

## 2022-06-06 NOTE — Progress Notes (Signed)
Inpatient Rehabilitation Care Coordinator Assessment and Plan Patient Details  Name: [name] MRN: [id number] Date of Birth: 1960/12/29  Today's Date: 06/06/2022  Hospital Problems: Principal Problem:   Acute stroke of medulla oblongata [facility])  Past Medical History:  Past Medical History:  Diagnosis Date   Abnormality of gait 05/10/2010   BACK PAIN 11/14/2008   Class 1 obesity due to excess calories with body mass index (BMI) of 31.0 to 31.9 in adult 02/07/2021   DIABETES MELLITUS, TYPE II 07/15/2008   Diplopia 07/15/2008   ECZEMA, ATOPIC 04/03/2009   GERD (gastroesophageal reflux disease)    Guillain-Barre ([place]) 1988   HYPERLIPIDEMIA 03/06/2009   HYPERTENSION 07/15/2008   Stroke ([place]) 2010, 2011   x2    Vertebral artery stenosis    Past Surgical History:  Past Surgical History:  Procedure Laterality Date   ABDOMINAL HYSTERECTOMY     DILATION AND CURETTAGE OF UTERUS     FOOT SURGERY     IR ANGIO INTRA EXTRACRAN SEL COM CAROTID INNOMINATE UNI L MOD SED  09/17/2021   IR ANGIO INTRA EXTRACRAN SEL INTERNAL CAROTID UNI R MOD SED  09/17/2021   IR ANGIO VERTEBRAL SEL VERTEBRAL UNI R MOD SED  09/17/2021   IR US GUIDE VASC ACCESS RIGHT  09/17/2021   Social History:  reports that she has never smoked. She has never used smokeless tobacco. She reports that she does not drink alcohol and does not use drugs.  Family / [place] Patient Roles: Other (Comment) Spouse/Significant Other: N/A Children: N/A Other Supports: Bestfriend, Mason Jim, sister and niece Anticipated Caregiver: Bestfriend and sister anticipate Ability/Limitations of Caregiver: 24/7 Caregiver Availability: 24/7 Family Dynamics: support from sister and bestfriend  Social History Preferred language: English Religion: Baptist Cultural Background: Patient previously independent and retired, with support from her bestfriend and sister Education: [place] - How often do you need to have  someone help you when you read instructions, pamphlets, or other written material from your doctor or pharmacy?: Never Writes: Yes Employment Status: Retired Date Retired/Disabled/Unemployed: 2021/2022 Legal History/Current Legal Issues: N/A Guardian/Conservator: N/A   Abuse/Neglect Abuse/Neglect Assessment Can Be Completed: Yes Physical Abuse: Denies Verbal Abuse: Denies Sexual Abuse: Denies Exploitation of patient/patient's resources: Denies Self-Neglect: Denies  Patient response to: Social Isolation - How often do you feel lonely or isolated from those around you?: Never  Emotional Status Pt's affect, behavior and adjustment status: Pleasant Recent Psychosocial Issues: Coping Psychiatric History: N/A Substance Abuse History: N/A  Patient / Family Perceptions, Expectations & Goals Pt/Family understanding of illness & functional limitations: yes Premorbid pt/family roles/activities: Independent overall with support from sister Anticipated changes in roles/activities/participation: Sister (works), niece and bestfriend Pt/family expectations/goals: [place]: None Premorbid Home Care/DME Agencies: Other (Comment) ([employer] and [employer]PC) Transportation available at discharge: bestfriend, sister or niece Is the patient able to respond to transportation needs?: Yes In the past 12 months, has lack of transportation kept you from medical appointments or from getting medications?: No In the past 12 months, has lack of transportation kept you from meetings, work, or from getting things needed for daily living?: No Resource referrals recommended: Neuropsychology  Discharge Planning Living Arrangements: Alone Support Systems: Other relatives, Friends/neighbors Type of Residence: Private residence (curb and 3 steps) Insurance Resources: Multimedia programmer (specify) Psychologist, counselling) Financial Resources: SSD Financial Screen Referred: No Living  Expenses: Education officer, community Management: Patient Does the patient have any problems obtaining your medications?: No Home Management: Independent Patient/Family Preliminary Plans: pLANS  TO MANAGE, IF UNABLE SISTER AND BESFRIEND ABLE TO ASSIST Care Coordinator Barriers to Discharge: Inaccessible home environment, Decreased caregiver support, Insurance for SNF coverage, Lack of/limited family support Care Coordinator Barriers to Discharge Comments: Sister works Glass blower/designer Anticipated Follow Up Needs: HH/OP Expected length of stay: 10-12 dAYS  Clinical Impression Sw met with patient, introduced self and explained role. Patient anticipates discharge home with assistance from her sister and best friend who anticipate rotating supervision and PRN assistance from pt's niece. Patient owns a rollator and SPC. 3 steps to enter the home. Patient waiting on sister to return her phone call. No additional questions or concerns.   Dyanne Iha 06/06/2022, 1:30 PM

## 2022-06-06 NOTE — Progress Notes (Signed)
Inpatient Rehabilitation Center Individual Statement of Services  Patient Name:  [name]  Date:  06/06/2022  Welcome to the [facility].  Our goal is to provide you with an individualized program based on your diagnosis and situation, designed to meet your specific needs.  With this comprehensive rehabilitation program, you will be expected to participate in at least 3 hours of rehabilitation therapies Monday-Friday, with modified therapy programming on the weekends.  Your rehabilitation program will include the following services:  Physical Therapy (PT), Occupational Therapy (OT), Speech Therapy (ST), 24 hour per day rehabilitation nursing, Therapeutic Recreaction (TR), Neuropsychology, Care Coordinator, Rehabilitation Medicine, Nutrition Services, Pharmacy Services, and Other  Weekly team conferences will be held on Wednesdays to discuss your progress.  Your Inpatient Rehabilitation Care Coordinator will talk with you frequently to get your input and to update you on team discussions.  Team conferences with you and your family in attendance may also be held.  Expected length of stay: 14-16 Days  Overall anticipated outcome:  Min A to Supervision  Depending on your progress and recovery, your program may change. Your Inpatient Rehabilitation Care Coordinator will coordinate services and will keep you informed of any changes. Your Inpatient Rehabilitation Care Coordinator's name and contact numbers are listed  below.  The following services may also be recommended but are not provided by the [facility]:   [place] will be made to provide these services after discharge if needed.  Arrangements include referral to agencies that provide these services.  Your insurance has been verified to be:   [place] primary doctor is:  [name], Betty, MD  Pertinent information  will be shared with your doctor and your insurance company.  Inpatient Rehabilitation Care Coordinator:  Erlene Quan, [place] or [phone]  Information discussed with and copy given to patient by: Dyanne Iha, 06/06/2022, 9:31 AM

## 2022-06-06 NOTE — Progress Notes (Signed)
Occupational Therapy Session Note  Patient Details  Name: [name] MRN: [id number] Date of Birth: [birth year]  Today's Date: 06/06/2022 OT Individual Time: 1530-1559 OT Individual Time Calculation (min): 29 min    Short Term Goals: Week 1:  OT Short Term Goal 1 (Week 1): Pt will improve ability to perform toilet transfer with 1 helper OT Short Term Goal 2 (Week 1): Pt will perform shower level bathing with no more than MOD A OT Short Term Goal 3 (Week 1): Pt will improve functional use of LUE to grasp/release 3/3 trials OT Short Term Goal 4 (Week 1): Pt will improve sit to stands for LB ADL with MIN A  Skilled Therapeutic Interventions/Progress Updates:    Patient agreeable to participate in OT session. Reports 0/10 pain level.   Patient participated in skilled OT session focusing on NM re-ed.  Pt completed 10 sit to stand transitions using Hemi walker and Min A for balance. OT blocked left knee due to knee buckling. VC for hand placement and technique.  Kinesiotape applied to left knee to assist with prevention of knee hyperextension and to provide increased knee support to prevent knee buckling.  Pt verbalized an increased feeling of knee support after tape application.   Therapy Documentation Precautions:  Precautions Precautions: Fall Precaution Comments: Left Hemiparesis, Left knee buckles, support left arm in standing Restrictions Weight Bearing Restrictions: No   Therapy/Group: Individual Therapy  Ailene Ravel, OTR/L,CBIS  Supplemental OT - [facility] and [facility] Secure Chat Preferred   06/06/2022, 12:36 PM

## 2022-06-07 DIAGNOSIS — E1142 Type 2 diabetes mellitus with diabetic polyneuropathy: Secondary | ICD-10-CM | POA: Diagnosis not present

## 2022-06-07 DIAGNOSIS — F32A Depression, unspecified: Secondary | ICD-10-CM | POA: Diagnosis not present

## 2022-06-07 DIAGNOSIS — I639 Cerebral infarction, unspecified: Secondary | ICD-10-CM | POA: Diagnosis not present

## 2022-06-07 DIAGNOSIS — I10 Essential (primary) hypertension: Secondary | ICD-10-CM | POA: Diagnosis not present

## 2022-06-07 LAB — GLUCOSE, CAPILLARY
Glucose-Capillary: 100 mg/dL — ABNORMAL HIGH (ref 70–99)
Glucose-Capillary: 215 mg/dL — ABNORMAL HIGH (ref 70–99)
Glucose-Capillary: 145 mg/dL — ABNORMAL HIGH (ref 70–99)
Glucose-Capillary: 172 mg/dL — ABNORMAL HIGH (ref 70–99)
Glucose-Capillary: 154 mg/dL — ABNORMAL HIGH (ref 70–99)

## 2022-06-07 MED ORDER — INSULIN ASPART 100 UNIT/ML IJ SOLN
6.0000 [IU] | Freq: Three times a day (TID) | INTRAMUSCULAR | Status: DC
  Administered 2022-06-07 – 2022-06-12 (×13): 6 [IU] via SUBCUTANEOUS

## 2022-06-07 NOTE — Progress Notes (Signed)
Occupational Therapy Session Note  Patient Details  Name: [name] MRN: [id number] Date of Birth: [birth year]  Today's Date: 06/07/2022 OT Individual Time: 1003-1105 OT Individual Time Calculation (min): 62 min    Short Term Goals: Week 1:  OT Short Term Goal 1 (Week 1): Pt will improve ability to perform toilet transfer with 1 helper OT Short Term Goal 2 (Week 1): Pt will perform shower level bathing with no more than MOD A OT Short Term Goal 3 (Week 1): Pt will improve functional use of LUE to grasp/release 3/3 trials OT Short Term Goal 4 (Week 1): Pt will improve sit to stands for LB ADL with MIN A  Skilled Therapeutic Interventions/Progress Updates:  Pt greeted seated in recliner, pt agreeable to OT intervention. Pt reports need to void. Utilized stedy for toilet transfer since pt was in recliner. Pt able to stand to stedy with CGA. Total A transport into bathroom in stedy, pt with + urine void needing' \\MODA'$  assist for 3/3 toileting tasks.  Pt was able to stand in stedy to complete anterior pericare with RUE while MODA for balance support.         Total A transport to gym where pt completed squat pivot transfers throughout session with MIN A to R side and MOD A to L side ( +2 needed to L side as w/c kept sliding).   Once EOM worked on various NMR tasks to [place]. Pt able to lateral lean onto LUE to then reach across midline with RUE to collect bean bags with pt then instructed to push into LUE to return back to midline, big emphasis on returning to upright posture as pt tends to present with flexed posture in unsupported sitting. Pt overall required up to MOD A to complete task.   Also worked on [employer] in [place] with pts LUE positioned on angle table and pt instructed to push table forward/backwards with an emphasis on scapular protraction/retraction. Pt able to complete task with MIN A to keep L hand positioned on stool.  Also worked on functional grasp with LUE with pt able to grossly  grasp rolled up wash cloth and practiced taking item to opposite UE, forehead and mouth with overall light MIN A at L elbow.   Ended session with pt seated in recliner with all needs within reach and chair alarm activated.                    Therapy Documentation Precautions:  Precautions Precautions: Fall Precaution Comments: Left Hemiparesis, Left knee buckles, support left arm in standing Restrictions Weight Bearing Restrictions: No  Pain: Unrated "discomfort" in L side, repositioning and education provided on support strategies for pain mgmt.     Therapy/Group: Individual Therapy  Corinne Ports [facility] 06/07/2022, 12:04 PM

## 2022-06-07 NOTE — Progress Notes (Signed)
Physical Therapy Session Note  Patient Details  Name: [name] MRN: [id number] Date of Birth: [birth year]  Today's Date: 06/07/2022 PT Individual Time: 4098-1191 PT Individual Time Calculation (min): 55 min   Short Term Goals: Week 1:  PT Short Term Goal 1 (Week 1): Pt will complete bed to chair transfer with ModA + 1 PT Short Term Goal 2 (Week 1): Pt will complete supine <> sit with CGA PT Short Term Goal 3 (Week 1): Pt will ambulate 10 ft with LRAD PT Short Term Goal 4 (Week 1): Pt will complete sit to stand with CGA  + LRAD  Skilled Therapeutic Interventions/Progress Updates:    Pt requesting to use bathroom. Performed squat pivot transfer from recliner to w/c with light mod assist and verbal cues for correct foot and hand placement, facilitation for weightshift. Transported via w/c into the bathroom and used grab for stand pivot with min assist. +2 from NT to assist with clothing management and hygiene while we focused on maintaining functional dynamic standing balance with min assist and blocked L knee. Decreased L knee control noted and R lean for support. Performed bimanual task for hand hygiene at the sink including manipulating the faucet, soap, hygiene, and use of towel for drying incorporating L hemi arm.   NMR for blocked practice sit <> stands with focus on increasing forced weightbearing and activation to LLE using parallel bars for support once in standing. Manual facilitation for weightshifting in standing with verbal and tactile cues needed as well as verbal cues for upright posture and glute activation and trunk extension. Min to mod assist overall needed. Progressed to pre-gait activities with forward and backward stepping of LLE on 2 trials x 5 reps each. Hip flexion and hamstring activation noted but minimal quad activation noted functionally.  Self propelled with hemi technique back to room with verbal cues for efficiency for w/c mobility training and overall  strengthening and endurance. Left up in w/c with all needs in reach.    Therapy Documentation Precautions:  Precautions Precautions: Fall Precaution Comments: Left Hemiparesis, Left knee buckles, support left arm in standing Restrictions Weight Bearing Restrictions: No General:   Vital Signs: Therapy Vitals Temp: 98.3 F (36.8 C) Temp Source: Oral Pulse Rate: 62 Resp: 17 BP: (!) 143/70 Patient Position (if appropriate): Sitting Oxygen Therapy SpO2: 96 % O2 Device: Room Air Pain:   Mobility:   Locomotion :    Trunk/Postural Assessment :    Balance:   Exercises:   Other Treatments:      Therapy/Group: Individual Therapy  Canary Brim Ivory Broad, PT, DPT, CBIS 06/07/2022, 2:59 PM

## 2022-06-07 NOTE — Progress Notes (Signed)
PROGRESS NOTE   Subjective/Complaints: Working with therapy this AM. No new concerns or questions.   ROS:  Denies fevers, chills, rash, N/V, abdominal pain, constipation, diarrhea, SOB,  chest pain, HA, new weakness or paraesthesias.    Objective:   No results found. Recent Labs    06/05/22 0634  WBC 6.8  HGB 10.9*  HCT 32.6*  PLT 246    Recent Labs    06/05/22 0634  NA 136  K 3.8  CL 110  CO2 21*  GLUCOSE 125*  BUN 26*  CREATININE 1.35*  CALCIUM 9.0     Intake/Output Summary (Last 24 hours) at 06/07/2022 0820 Last data filed at 06/07/2022 0750 Gross per 24 hour  Intake 1431 ml  Output --  Net 1431 ml         Physical Exam: Vital Signs Blood pressure (!) 149/74, pulse 61, temperature 98.2 F (36.8 C), resp. rate 17, height '5\' 4"'$  (1.626 m), weight 82.2 kg, SpO2 97 %.  Constitutional: No apparent distress. Appropriate appearance for age. +obese.  HENT: No JVD.   Atraumatic, normocephalic. Eyes: PERRLA. Conjugate gaze Cardiovascular: RRR, no murmurs/rub/gallops. No Edema.   Respiratory: CTAB. No rales, rhonchi, or wheezing. On RA.  Abdomen: + bowel sounds, normoactive. No distention or tenderness.  Skin: C/D/I. No apparent lesions. MSK:      No apparent deformity.  Moving all 4 extremities Neuro: Alert and oriented x3, mild dysarthria, CN 2-12 grossly intact   Prior exam       Strength: RUE and RLE 5/5                 LUE: 4-/5 SA, 3-/5 EF, 3-/5 EE, 1/5 WE, 2/5 FF, 1/5 FA - improved                LLE:  3-/5 HF, 2-/5 KE, 0/5 DF/EHL/ PF  Neurologic exam:  Cognition: AAO to person, place, time and event.  Language: Fluent, No substitutions or neoglisms. + mild dysarthria.  Insight: Good  insight into current condition.  Mood: Pleasant affect, appropriate mood.  Sensation: To light touch intact in BL Ues; decreased in L foot CN: + L tongue deviation + L shoulder weakness Tone: no apparent  spasticity      Assessment/Plan: 1. Functional deficits which require 3+ hours per day of interdisciplinary therapy in a comprehensive inpatient rehab setting. Physiatrist is providing close team supervision and 24 hour management of active medical problems listed below. Physiatrist and rehab team continue to assess barriers to discharge/monitor patient progress toward functional and medical goals  Care Tool:  Bathing    Body parts bathed by patient: Left arm, Chest, Abdomen, Front perineal area, Buttocks, Right upper leg, Left upper leg, Face   Body parts bathed by helper: Left lower leg, Right lower leg, Buttocks, Right arm     Bathing assist Assist Level: Maximal Assistance - Patient 24 - 49%     Upper Body Dressing/Undressing Upper body dressing   What is the patient wearing?: Pull over shirt    Upper body assist Assist Level: Maximal Assistance - Patient 25 - 49%    Lower Body Dressing/Undressing Lower body dressing  What is the patient wearing?: Pants, Incontinence brief     Lower body assist Assist for lower body dressing: Total Assistance - Patient < 25%     Toileting Toileting    Toileting assist Assist for toileting: Maximal Assistance - Patient 25 - 49%     Transfers Chair/bed transfer  Transfers assist     Chair/bed transfer assist level: 2 Helpers     Locomotion Ambulation   Ambulation assist   Ambulation activity did not occur: Safety/medical concerns (limited 2/2 LLE deficits)          Walk 10 feet activity   Assist  Walk 10 feet activity did not occur: Safety/medical concerns (limited 2/2 LLE deficits)        Walk 50 feet activity   Assist Walk 50 feet with 2 turns activity did not occur: Safety/medical concerns (limited 2/2 LLE deficits)         Walk 150 feet activity   Assist Walk 150 feet activity did not occur: Safety/medical concerns (limited 2/2 LLE deficits)         Walk 10 feet on uneven surface   activity   Assist Walk 10 feet on uneven surfaces activity did not occur: Safety/medical concerns (limited 2/2 LLE deficits)         Wheelchair     Assist Is the patient using a wheelchair?: No   Wheelchair activity did not occur: N/A         Wheelchair 50 feet with 2 turns activity    Assist    Wheelchair 50 feet with 2 turns activity did not occur: N/A       Wheelchair 150 feet activity     Assist  Wheelchair 150 feet activity did not occur: N/A       Blood pressure (!) 149/74, pulse 61, temperature 98.2 F (36.8 C), resp. rate 17, height '5\' 4"'$  (1.626 m), weight 82.2 kg, SpO2 97 %. Medical Problem List and Plan: 1. 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  Antithrombotics: -DVT/anticoagulation:  Pharmaceutical: Lovenox             -antiplatelet therapy: Plavix/Pletal 3. Pain Management:  Tylenol prn.  4. Mood/Behavior/Sleep:  LCSW to follow for evaluation and support.              -antipsychotic agents: N/A             --trazodone prn for insomnia.  5. Neuropsych/cognition: This patient is capable of making decisions on her own behalf. 6. Skin/Wound Care: Routine pressure relief measures.  7. Fluids/Electrolytes/Nutrition: Monitor I/O. Check CMET in am - stable AM labs  8. HTN; Monitor BP TID--avoid hypoperfusion.  --continue hydralazine, losartan, amlodipine and metoprolol - Mild HTN; monitor with therapies -2/6 controlled overall, continue current medications     06/07/2022    6:02 AM 06/07/2022    4:47 AM 06/07/2022    4:45 AM  Vitals with BMI  Systolic [phone]  Diastolic 74 67 67  Pulse  61 60      9. T2DM w/peripheral neuroapthy: Hgb A1C- 11.4 (improving >14 11/23) Poorly controlled.              --  was on  Trulicity weekly, lantus 30 units and novolog 8 units TID ac. --Continue insulin glargine 15 units BID with 5 units meal coverage TID. Titrate as indicated.              --am hypoglycemia noted. Will change SSI to sensitive.    - 2/6 increase meal coverage to 6 units Recent Labs    06/06/22 2101 06/07/22 0605 06/07/22 1149  GLUCAP 222* 100* 215*     10. Elevated lipids: On Lipitor and Zetia.  11. PVD: On pletal and atorvastatin. 12. H/o depression/anxiety: Continue Prozax with xanax/hydroxyzine prn.   -Reports mood is stable, monitor 13. Obesity: BMI-30. Educate on diet and wt loss to help promote health and mobility.  14. CKD: Baseline SCr 1.3-1.4. Avoid nephrotoxic agents.              --note rise in BUN--encourage fluid intake. Monitor with serial checks.    - 2/5: Cr 1.35/BUN 26, around her baseline, continue to monitor   15. Dyslipidemia: Continue Zetia/Lipitor.  --Refer to cardiology for  PCSK9? (Has seen Dr. [name] for PAD) 68. Anemia   -HGB 10.9, stable, continue to monitor      LOS: 3 days A FACE TO FACE EVALUATION WAS PERFORMED  Jennye Boroughs 06/07/2022, 8:20 AM

## 2022-06-07 NOTE — Progress Notes (Signed)
Physical Therapy Session Note  Patient Details  Name: [name] MRN: [id number] Date of Birth: July 24, 1960  Today's Date: 06/07/2022 PT Individual Time: 1537-1610 PT Individual Time Calculation (min): 33 min   Short Term Goals: Week 1:  PT Short Term Goal 1 (Week 1): Pt will complete bed to chair transfer with ModA + 1 PT Short Term Goal 2 (Week 1): Pt will complete supine <> sit with CGA PT Short Term Goal 3 (Week 1): Pt will ambulate 10 ft with LRAD PT Short Term Goal 4 (Week 1): Pt will complete sit to stand with CGA  + LRAD  Skilled Therapeutic Interventions/Progress Updates:  Patient seated upright in w/c on entrance to room. Patient alert and agreeable to PT session.   Patient with no pain complaint at start of session. Relates fatigue d/t long day of therapy.   Therapeutic Activity: Transfers: Pt performed sit<>stand transfers during session requiring ModA initially then minA to reach standing with RUE support on HW. VC required for maintaining effort, LLE muscle activation, RUE hand progression, upright posture.   Neuromuscular Re-ed: NMR facilitated during session with focus on LLE activation, motor control, sequencing. Pt guided in lateral stepping using HW to L 5' then return to R 5'. Mod/ maxA for abduction and advancement. Pt impulsive in stepping RLE toward LLE prior to focus on L knee extension for stability. MaxA to guard L knee and prevent buckle.   Pt guided in pregait training including lateral weight shifting and hold of L knee in extension. MaxA improving to [facility] for guard to L knee. Sit<>stand with slow eccentric descent to sit over midline with Min/ Mod A for control. Then rise to stance with RUE support on HW and vc required for maintaining upright posture.  Pt tending to melt to L with stance to L.   NMR performed for improvements in motor control and coordination, balance, sequencing, judgement, and self confidence/ efficacy in performing all aspects of  mobility at highest level of independence.   Patient seated upright in recliner with BLE elevated at end of session with brakes locked, seat pad alarm set, and all needs within reach.  Therapy Documentation Precautions:  Precautions Precautions: Fall Precaution Comments: Left Hemiparesis, Left knee buckles, support left arm in standing Restrictions Weight Bearing Restrictions: No General:   Vital Signs:  Pain:  No pain related this session.   Therapy/Group: Individual Therapy  Alger Simons PT, DPT, CSRS 06/07/2022, 6:23 PM

## 2022-06-07 NOTE — Progress Notes (Incomplete)
Physical Therapy Session Note  Patient Details  Name: [name] MRN: [id number] Date of Birth: 11/07/1960  Today's Date: 06/07/2022 PT Individual Time: 0805-0903 PT Individual Time Calculation (min): 58 min   Short Term Goals: Week 1:  PT Short Term Goal 1 (Week 1): Pt will complete bed to chair transfer with ModA + 1 PT Short Term Goal 2 (Week 1): Pt will complete supine <> sit with CGA PT Short Term Goal 3 (Week 1): Pt will ambulate 10 ft with LRAD PT Short Term Goal 4 (Week 1): Pt will complete sit to stand with CGA  + LRAD  Skilled Therapeutic Interventions/Progress Updates:  Patient seated upright in recliner on entrance to room. Has completed breakfast. Patient alert and agreeable to PT session.   Patient with no pain complaint at start of session.  Therapeutic Activity: Pt performs dressing task while seated at recliner. Requires ModA for UB dressing and MaxA to complete donning of pants.  Transfers: Pt performed sit<>stand transfers throughout session with ModA. Provided verbal cues for pushing from w/c armrest and maintaining effort into full upright stance. Stand pivot requires Mod/MaxA for LLE pivot step placement and block to L knee.   Gait Training:  Pt ambulated 18' x1/ 25' x1 using R HR in hallway with [place]. She is able to initiate LLE advancement but then requiring assist for placement and block/ guard to L knee to prevent buckling, then hyperextension. Tending to impulsively attempt to step with RLE before stabilizing LLE for stance. Provided vc/ tc for upright posture, leading LLE step with hip/ knee flexion, attaining knee extension on LLE prior to RLE advancement.  Neuromuscular Re-ed: NMR facilitated during session with focus on motor control, standing balance, L quad activation. Pt guided in slow performance of minisquats maintaining midline orientation. Then progressed to blocked practice of sit<>stand with focus on slow eccentric descent to sit. NMR performed  for improvements in motor control and coordination, balance, sequencing, judgement, and self confidence/ efficacy in performing all aspects of mobility at highest level of independence.   Continues to demo difficulty with seated LLE AROM for all joints. Trace muscle activation with knee extension. Patient seated upright in recliner with BLE elevated at end of session with brakes locked, seat pad alarm set, and all needs within reach.   Therapy Documentation Precautions:  Precautions Precautions: Fall Precaution Comments: Left Hemiparesis, Left knee buckles, support left arm in standing Restrictions Weight Bearing Restrictions: No General:   Vital Signs: Therapy Vitals Temp: 98.2 F (36.8 C) Pulse Rate: 61 Resp: 17 BP: (!) 149/74 Patient Position (if appropriate): Lying Oxygen Therapy SpO2: 97 % O2 Device: Room Air Pain:  Low grade pain complaint below L knee after ambulation. Repositioned.   Therapy/Group: Individual Therapy  Alger Simons PT, DPT, CSRS 06/07/2022, 7:58 AM

## 2022-06-08 ENCOUNTER — Ambulatory Visit: Payer: Commercial Managed Care - HMO | Admitting: Internal Medicine

## 2022-06-08 LAB — GLUCOSE, CAPILLARY
Glucose-Capillary: 92 mg/dL (ref 70–99)
Glucose-Capillary: 164 mg/dL — ABNORMAL HIGH (ref 70–99)
Glucose-Capillary: 201 mg/dL — ABNORMAL HIGH (ref 70–99)
Glucose-Capillary: 169 mg/dL — ABNORMAL HIGH (ref 70–99)

## 2022-06-08 NOTE — Progress Notes (Addendum)
Speech Language Pathology Discharge Summary  Patient Details  Name: [name] MRN: [id number] Date of Birth: 06/30/1960  Date of Discharge from SLP service:June 08, 2022  Today's Date: 06/08/2022 SLP Individual Time: 1455-1550 SLP Individual Time Calculation (min): 55 min  Skilled Therapeutic Interventions:  Skilled ST treatment focused on cognitive goals. Pt was greeted in recliner on arrival and requesting assistance for repositioning. SLP facilitated further higher level assessment in the domains of complex problem solving and executive functions. Pt completed medication management tasks with overall mod I for medication label comprehension, problem solving hypothetical scenarios pertaining to medication safety, pillbox organization, and identification of intentional BID pillbox errors. Pt required verbal explanation cue x1 to understand difference btw "take 2 pills twice daily" vs. "take 1 pill twice daily." Following explanation she was able to proceed with mod I with excellent carry over and additional processing time for thoroughness. Pt reported she has been completing financial responsibilities while here in [place] by paying bills over the phone with independence. Pt reported she feels like she is at her cognitive baseline with no related concerns identified at this time. Pt endorsed mild speech changes however pt was perceived as fully intelligible at the conversational level and stated she would like to focus her therapy efforts on PT/OT treatments at this time. SLP provided education on speech intelligibility strategies using "be a boss" acronym. Pt verbalized and demonstrated understanding. SLP also provided education on living a brain healthy lifestyle and provided handout to reinforce information. Pt verbalized understanding through teach back. Considering pt is likely at cognitive baseline, pt met goals at mod I level, and wishes to focus on PT/OT efforts, further SLP services do not  appear clinically indicated at this time and SLP plans to sign off. Pt verbalized understanding and agreement. Patient was left in recliner with alarm activated and immediate needs within reach at end of session.   Patient has met 1 of 1 long term goals.  Patient to discharge at overall Modified Independent level.  Reasons goals not met: NA   Clinical Impression/Discharge Summary: Patient has made excellent progress and met 1 of 1 long-term goals this admission. Patient is currently completing functional and complex cognitive tasks with mod I A cues in regards to complex problem solving. Pt feels she is at cognitive baseline and performed [facility] among all tested areas. Patient education is complete and patient to discharge at overall mod I level from a cognitive perspective. Discharge planning with pt's care partner(s) is ongoing. F/u SLP services at discharge do not appear clinically indicated at this time.   Care Partner:  Caregiver Able to Provide Assistance:  (TBD)    Recommendation:  None     Equipment: None   Reasons for discharge: Treatment goals met   Patient/Family Agrees with Progress Made and Goals Achieved: Yes    Patty Sermons 06/08/2022, 4:42 PM

## 2022-06-08 NOTE — Progress Notes (Signed)
Patient ID: [name], female   DOB: 07-Jul-1960, 62 y.o.   MRN: [id number]  Team Conference Report to Patient/Family  Team Conference discussion was reviewed with the patient and caregiver, including goals, any changes in plan of care and target discharge date.  Patient and caregiver express understanding and are in agreement.  The patient has a target discharge date of 06/30/22.  Sw met with patient and called sister, [name] at bedside to provide conference information. Sister concerned about 24/7 at times and requesting [facility] resources. SW emailed resources to sister at [email or web address]. No additional questions or concerns.   Dyanne Iha 06/08/2022, 1:58 PM

## 2022-06-08 NOTE — Plan of Care (Signed)
  Problem: RH Problem Solving Goal: LTG Patient will demonstrate problem solving for (SLP) Description: LTG:  Patient will demonstrate problem solving for basic/complex daily situations with cues  (SLP) Outcome: Completed/Met

## 2022-06-08 NOTE — Progress Notes (Signed)
Occupational Therapy Session Note  Patient Details  Name: [name] MRN: [id number] Date of Birth: 03/16/1961  Today's Date: 06/08/2022 OT Individual Time: 8295-6213 OT Individual Time Calculation (min): 71 min    Short Term Goals: Week 1:  OT Short Term Goal 1 (Week 1): Pt will improve ability to perform toilet transfer with 1 helper OT Short Term Goal 2 (Week 1): Pt will perform shower level bathing with no more than MOD A OT Short Term Goal 3 (Week 1): Pt will improve functional use of LUE to grasp/release 3/3 trials OT Short Term Goal 4 (Week 1): Pt will improve sit to stands for LB ADL with MIN A  Skilled Therapeutic Interventions/Progress Updates:  Pt greeted seated in recliner, pt reports need to void bladder, utilized stedy d/t urgency.pt able to stand to stedy with CGA total A transport on/off toilet in stedy. Pt required MODA for 3/3 toileting tasks, pt able to complete pericare in standing in stedy with UB supported for balance.   Total A transport to gym in w/c.  1:1 NMES applied to L wrist extensors at the below settings:    Ratio 1:3 Rate 35 pps Waveform- Asymmetric Ramp 1.0 Pulse 300 Intensity- 28  Duration -   10 mins  Good activation noted in wrist extensors, no pain or adverse reactions noted   Utilized [place] MAS to facilitate gravity eliminated AAROM via scapular protraction/retraction, elbow flexion/extension. Resistance set at level 4. Pt with great activations at L shoulder, utilized mirror for visual feedback.   Pt transported back to room with total A with pt able to squat pivot back to recliner to R side with MINA.   Ended session with pt seated in recliner with chair alarm activated and all needs within reach.   Therapy Documentation Precautions:  Precautions Precautions: Fall Precaution Comments: Left Hemiparesis, Left knee buckles, support left arm in standing Restrictions Weight Bearing Restrictions: No Pain:  No pain reported during  session   Therapy/Group: Individual Therapy  Precious Haws 06/08/2022, 3:36 PM

## 2022-06-08 NOTE — Progress Notes (Signed)
PROGRESS NOTE   Subjective/Complaints:  Spoke to pt's sister via phone , pt with neck pain on left side    ROS:  Denies CP, SOB, N/V/D.    Objective:   No results found. No results for input(s): "WBC", "HGB", "HCT", "PLT" in the last 72 hours.  No results for input(s): "NA", "K", "CL", "CO2", "GLUCOSE", "BUN", "CREATININE", "CALCIUM" in the last 72 hours.   Intake/Output Summary (Last 24 hours) at 06/08/2022 0832 Last data filed at 06/08/2022 0744 Gross per 24 hour  Intake 1191 ml  Output --  Net 1191 ml         Physical Exam: Vital Signs Blood pressure (!) 161/78, pulse 67, temperature 98.1 F (36.7 C), temperature source Oral, resp. rate 16, height '5\' 4"'$  (1.626 m), weight 82.2 kg, SpO2 98 %.   General: No acute distress Mood and affect are appropriate Heart: Regular rate and rhythm no rubs murmurs or extra sounds Lungs: Clear to auscultation, breathing unlabored, no rales or wheezes Abdomen: Positive bowel sounds, soft nontender to palpation, nondistended Extremities: No clubbing, cyanosis, or edema   MSK:      No apparent deformity.  Moving all 4 extremities Neuro: Alert and oriented x3, mild dysarthria, CN 2-12 grossly intact Tender Left trap  Prior exam       Strength: RUE and RLE 5/5                 LUE: 2- shoulder abd, 3-/5 EF, 3-/5 EE, 1/5 WE, 2/5 FF, 1/5 FA - improved                LLE:  3-/5 HF, 2-/5 KE, 0/5 DF/EHL/ PF  Neurologic exam:  Cognition: AAO to person, place, time and event.  Language: Fluent, No substitutions or neoglisms. + mild dysarthria.        Assessment/Plan: 1. Functional deficits which require 3+ hours per day of interdisciplinary therapy in a comprehensive inpatient rehab setting. Physiatrist is providing close team supervision and 24 hour management of active medical problems listed below. Physiatrist and rehab team continue to assess barriers to discharge/monitor  patient progress toward functional and medical goals  Care Tool:  Bathing    Body parts bathed by patient: Left arm, Chest, Abdomen, Front perineal area, Buttocks, Right upper leg, Left upper leg, Face   Body parts bathed by helper: Left lower leg, Right lower leg, Buttocks, Right arm     Bathing assist Assist Level: Maximal Assistance - Patient 24 - 49%     Upper Body Dressing/Undressing Upper body dressing   What is the patient wearing?: Pull over shirt    Upper body assist Assist Level: Maximal Assistance - Patient 25 - 49%    Lower Body Dressing/Undressing Lower body dressing      What is the patient wearing?: Pants, Incontinence brief     Lower body assist Assist for lower body dressing: Total Assistance - Patient < 25%     Toileting Toileting    Toileting assist Assist for toileting: Moderate Assistance - Patient 50 - 74%     Transfers Chair/bed transfer  Transfers assist     Chair/bed transfer assist level: 2 Helpers  Locomotion Ambulation   Ambulation assist   Ambulation activity did not occur: Safety/medical concerns (limited 2/2 LLE deficits)          Walk 10 feet activity   Assist  Walk 10 feet activity did not occur: Safety/medical concerns (limited 2/2 LLE deficits)        Walk 50 feet activity   Assist Walk 50 feet with 2 turns activity did not occur: Safety/medical concerns (limited 2/2 LLE deficits)         Walk 150 feet activity   Assist Walk 150 feet activity did not occur: Safety/medical concerns (limited 2/2 LLE deficits)         Walk 10 feet on uneven surface  activity   Assist Walk 10 feet on uneven surfaces activity did not occur: Safety/medical concerns (limited 2/2 LLE deficits)         Wheelchair     Assist Is the patient using a wheelchair?: Yes (Per PT Eval) Type of Wheelchair: Manual           Wheelchair 50 feet with 2 turns activity    Assist        Assist Level:  Dependent - Patient 0% (Per PT Eval)   Wheelchair 150 feet activity     Assist      Assist Level: Dependent - Patient 0%   Blood pressure (!) 161/78, pulse 67, temperature 98.1 F (36.7 C), temperature source Oral, resp. rate 16, height '5\' 4"'$  (1.626 m), weight 82.2 kg, SpO2 98 %. Medical Problem List and Plan: 1. 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 Formulized individual treatment plan based on medical history, underlying problem and comorbidities.  2.  Antithrombotics: -DVT/anticoagulation:  Pharmaceutical: Lovenox             -antiplatelet therapy: Plavix/Pletal 3. Pain Management:  Tylenol prn.  Mild tenderness Left trap  4. Mood/Behavior/Sleep:  LCSW to follow for evaluation and support.              -antipsychotic agents: N/A             --trazodone prn for insomnia.  5. Neuropsych/cognition: This patient is capable of making decisions on her own behalf. 6. Skin/Wound Care: Routine pressure relief measures.  7. Fluids/Electrolytes/Nutrition: Monitor I/O. Check CMET in am - stable AM labs  8. HTN; Monitor BP TID--avoid hypoperfusion.  --continue hydralazine, losartan, amlodipine and metoprolol - Mild HTN; monitor with therapies -2/6 controlled overall, continue current medications     06/08/2022    6:25 AM 06/08/2022    3:10 AM 06/07/2022    9:56 PM  Vitals with BMI  Systolic 811 572 620  Diastolic 78 68 73  Pulse 67 59 59      9. T2DM w/peripheral neuroapthy: Hgb A1C- 11.4 (improving >14 11/23) Poorly controlled.              --was on Trulicity weekly, lantus 30 units and novolog 8 units TID ac. --Continue insulin glargine 15 units  BID with 5 units meal coverage TID. Titrate as indicated.              --  am hypoglycemia noted. Will change SSI to sensitive.    - 2/6 increase meal coverage to 6 units Recent Labs    06/07/22 2054 06/07/22 2200 06/08/22 0611  GLUCAP 172* 154* 92     Fair control 2/7 10. Elevated lipids: On Lipitor and Zetia.  11. PVD: On pletal and atorvastatin. 12. H/o depression/anxiety: Continue Prozax with xanax/hydroxyzine prn.   -Reports mood is stable, monitor 13. Obesity: BMI-30. Educate on diet and wt loss to help promote health and mobility.  14. CKD: Baseline SCr 1.3-1.4. Avoid nephrotoxic agents.              --note rise in BUN--encourage fluid intake. Monitor with serial checks.    - 2/5: Cr 1.35/BUN 26, around her baseline, continue to monitor   15. Dyslipidemia: Continue Zetia/Lipitor.  --Refer to cardiology for  PCSK9? (Has seen Dr. [name] for PAD) 56. Anemia   -HGB 10.9, stable, continue to monitor      LOS: 4 days A FACE TO [place] E Garek Schuneman 06/08/2022, 8:32 AM

## 2022-06-08 NOTE — Progress Notes (Signed)
Physical Therapy Session Note  Patient Details  Name: [name] MRN: [id number] Date of Birth: November 16, 1960  Today's Date: 06/08/2022 PT Individual Time: 0808-0904 PT Individual Time Calculation (min): 56 min   Short Term Goals: Week 1:  PT Short Term Goal 1 (Week 1): Pt will complete bed to chair transfer with ModA + 1 PT Short Term Goal 2 (Week 1): Pt will complete supine <> sit with CGA PT Short Term Goal 3 (Week 1): Pt will ambulate 10 ft with LRAD PT Short Term Goal 4 (Week 1): Pt will complete sit to stand with CGA  + LRAD  Skilled Therapeutic Interventions/Progress Updates:  Patient seated in recliner with BLE elevated on entrance to room. Patient alert and agreeable to PT session. Relates soreness in L lateral neck in area of shoulder elevators. On palpation appears to be muscle soreness. Can address this day with thermotherapy.   Therapeutic Activity: Pt performs dressing task while seated in recliner with ModA for UB and MaxA for LB while pt holds RUE to [place] in stance.  Transfers: Pt performed sit<>stand from recliner with Min/ ModA. Stand pivot to w/c with pt unable to maintain balance requiring sit to recliner. Second attempt with pt following verbal cues for improved sequencing but continues to require Mod/ MaxA for producing pivot step. Provided verbal cues for sequencing step and technique throughout. Continued training for sit<>stand and stand pivot technique. Pt continues to require Min/ ModA for sit<>stands but with improved technique to push from seat with RUE. In stand pivot, pt continues to demo weight shift to LLE prior to establishing active knee extension with buckle to L knee with each step.   Neuromuscular Re-ed: NMR facilitated during session with focus on LLE muscle activation and standing balance. Pt guided in continuous reciprocation of BLE only using NuStep L1 x 31mn/ L2 x 589m/ L3 x 71m17m L2 x 1mi55mith focus on LLE push into full extension. Good performance  throughout with ability to minimize LLE ER and maintain neutral alignment. Maintains 1.6-1.8 METs demonstrating good power production.    NMR performed for improvements in motor control and coordination, balance, sequencing, judgement, and self confidence/ efficacy in performing all aspects of mobility at highest level of independence.   Patient requests to toilet at end of session and performs stand pivot to toilet with MaxA for LLE pivot stepping to position in front of toilet, MaxA for clothing mgmt. Pt able to urinate but demos difficulty with bowel movement. Pt seated on toilet with good balance. Related to pt to pull call cord beside toilet when completed. Positioned STEDY in front of pt. NT notified as to pt's disposition and need for assist off toilet.   Therapy Documentation Precautions:  Precautions Precautions: Fall Precaution Comments: Left Hemiparesis, Left knee buckles, support left arm in standing Restrictions Weight Bearing Restrictions: No General:   Vital Signs: Therapy Vitals Pulse Rate: 67 Resp: 16 BP: (!) 161/78 Patient Position (if appropriate): Lying Pain: Pain Assessment Pain Scale: 0-10 Pain Score: 5  Pain Type: Acute pain Pain Location: Arm Pain Orientation: Left Pain Descriptors / Indicators: Discomfort Pain Frequency: Intermittent Pain Onset: Gradual   Therapy/Group: Individual Therapy  JuliAlger Simons DPT, CSRS 06/08/2022, 10:11 AM

## 2022-06-08 NOTE — Patient Care Conference (Signed)
Inpatient RehabilitationTeam Conference and Plan of Care Update Date: 06/08/2022   Time: 10:28 AM    Patient Name: [name]      Medical Record Number: [id number]  Date of Birth: 1960/12/06 Sex: Female         Room/Bed: 4W22C/4W22C-01 Payor Info: Payor: [employer] / Plan: [employer] [place] H[employer] CONNECT / Product Type: *No Product type* /    Admit Date/Time:  06/04/2022 10:02 AM  Primary Diagnosis:  Acute stroke of medulla oblongata [facility])  Hospital Problems: Principal Problem:   Acute stroke of medulla oblongata [facility])    Expected Discharge Date: Expected Discharge Date: 06/30/22  Team Members Present: Physician leading conference: Dr. Alysia Penna Social Worker Present: Erlene Quan, BSW Nurse Present: Dorien Chihuahua, RN PT Present: Barrie Folk, PT OT Present: Willeen Cass, OT SLP Present: Sherren Kerns, SLP PPS Coordinator present : Gunnar Fusi, SLP     Current Status/Progress Goal Weekly Team Focus  Bowel/Bladder      Incontinent    Continent of bowel and bladder    toileting  Swallow/Nutrition/ Hydration               ADL's   MODA for 3/3 toielting tasks, MAX A for bathing from EOB, MAX A for dressing. MODA for stand pivot transfer with Rw, MIN A to squat pivot to R side. LUE overall 2-/5, L knee buckle during stand pivot transfers   S goals   BADL reeducation, functional mobility, LUE NMR, LUE estim    Mobility   bed mobility = ModA; Sit<>stand = ModA, Stand pivot = MaxA, ambulation = MaxA for LLE stability for 25' using hallway HR   overall supervision/ CGA; MinA for stair negotiation  L hemibody NMR, standing balance/ tolerance, transfer training, gait training, overal strenghtening    Communication                Safety/Cognition/ Behavioral Observations  mod I-to-sup? Pt scored WFL on all subtests of the [employer] standardized cognitive assessment. Pt requesting additional assessment given that she felt certain tasks were more effortful or took  her long than she expected to complete.   mod I   further assessment    Pain      Soreness overall and left side    <4 with prns    Assess need for and effectiveness of pain medication  Skin      N/a           Discharge Planning:  [place] home with bestfriend and sister to rotate supervision. Sister works, niece able to assist PRN. 3 steps to enter home. Patient has rollator and SPC.   Team Discussion: Patient with trapezius pain post medullary CVA. Labile BP addressed along with medication adjustment for hyperglycemia.  Patient on target to meet rehab goals: yes, currently needs max assist for bathing and dressing and mod assist for toileting. Min assist for squat pivot transfer to the right side and mod - max for sit - stand with left knee buckling. Able to ambulate up to 25' at the rail. Goals for discharge set for supervision- CGA overall; CGA for ambulation.  *See Care Plan and progress notes for long and short-term goals.   Revisions to Treatment Plan:  High level SLP eval and discharge   Teaching Needs: Safety, medication, secondary risk management, transfers, toileting, etc.   Current Barriers to Discharge: Decreased caregiver support and Home enviroment access/layout  Possible Resolutions to Barriers: Family education     Medical Summary Current Status: DM  daytime control is poor, BP still uncontrolled, LE> UE weakness severe  Barriers to Discharge: Medical stability   Possible Resolutions to Barriers/Weekly Focus: med adjustments for DM, HTN   Continued Need for Acute Rehabilitation Level of Care: The patient requires daily medical management by a physician with specialized training in physical medicine and rehabilitation for the following reasons: Direction of a multidisciplinary physical rehabilitation program to maximize functional independence : Yes Medical management of patient stability for increased activity during participation in an intensive  rehabilitation regime.: Yes Analysis of laboratory values and/or radiology reports with any subsequent need for medication adjustment and/or medical intervention. : Yes   I attest that I was present, lead the team conference, and concur with the assessment and plan of the team.   Dorien Chihuahua B 06/08/2022, 1:56 PM

## 2022-06-08 NOTE — Progress Notes (Signed)
Physical Therapy Session Note  Patient Details  Name: [name] MRN: [id number] Date of Birth: [birth year]  Today's Date: 06/08/2022 PT Individual Time: 1035-1100 PT Individual Time Calculation (min): 25 min   Short Term Goals: Week 1:  PT Short Term Goal 1 (Week 1): Pt will complete bed to chair transfer with ModA + 1 PT Short Term Goal 2 (Week 1): Pt will complete supine <> sit with CGA PT Short Term Goal 3 (Week 1): Pt will ambulate 10 ft with LRAD PT Short Term Goal 4 (Week 1): Pt will complete sit to stand with CGA  + LRAD   Skilled Therapeutic Interventions/Progress Updates:   Pt received sitting in recliner and agreeable to PT. Pt performed squat pivot transfer with mod assist and RUE supported on WC arm rest for safety. Cues for BLE position to improve symmetrical use of BLE. Pt transported to rail in hall outside [employer].   Gait training at rail in hall  3 x 73f with max assist and +2 for WC follow. PT required to assist to LLE advancement and blocking the LLE in stance to prevent knee buckle and GR as well as maintain hip extension in stance on the LLE. Mild knee pain reported but not rated on the LLE following first bout of gait only.   Patient returned to room and performed stand pivot to recliner with mod to block the LLE in transfer to the R side. Pt left sitting in recliner with call bell in reach and all needs met.      Therapy Documentation Precautions:  Precautions Precautions: Fall Precaution Comments: Left Hemiparesis, Left knee buckles, support left arm in standing Restrictions Weight Bearing Restrictions: No   Pain:   Mild L knee pain, unrated. Pt repositioned.   Therapy/Group: Individual Therapy  ALorie Phenix2/10/2022, 2:32 PM

## 2022-06-09 ENCOUNTER — Other Ambulatory Visit: Payer: Self-pay

## 2022-06-09 ENCOUNTER — Encounter (HOSPITAL_COMMUNITY): Payer: Self-pay | Admitting: Physical Medicine & Rehabilitation

## 2022-06-09 LAB — GLUCOSE, CAPILLARY
Glucose-Capillary: 91 mg/dL (ref 70–99)
Glucose-Capillary: 106 mg/dL — ABNORMAL HIGH (ref 70–99)
Glucose-Capillary: 103 mg/dL — ABNORMAL HIGH (ref 70–99)
Glucose-Capillary: 169 mg/dL — ABNORMAL HIGH (ref 70–99)

## 2022-06-09 MED ORDER — INSULIN GLARGINE-YFGN 100 UNIT/ML ~~LOC~~ SOLN
12.0000 [IU] | Freq: Two times a day (BID) | SUBCUTANEOUS | Status: DC
  Administered 2022-06-09 – 2022-06-12 (×6): 12 [IU] via SUBCUTANEOUS
  Filled 2022-06-09 (×8): qty 0.12

## 2022-06-09 NOTE — Progress Notes (Signed)
Physical Therapy Session Note  Patient Details  Name: Nur Gossman MRN: [id number] Date of Birth: 1960/05/27  Today's Date: 06/09/2022 PT Individual Time: 0805-0905 PT Individual Time Calculation (min): 60 min   Short Term Goals: Week 1:  PT Short Term Goal 1 (Week 1): Pt will complete bed to chair transfer with ModA + 1 PT Short Term Goal 2 (Week 1): Pt will complete supine <> sit with CGA PT Short Term Goal 3 (Week 1): Pt will ambulate 10 ft with LRAD PT Short Term Goal 4 (Week 1): Pt will complete sit to stand with CGA  + LRAD  Skilled Therapeutic Interventions/Progress Updates:   Pt received sitting in recliner and agreeable to PT. Posterior scooting in recliner. With mod assist from PT to block LLE.  Pt performed stedy transfer with min assist to [facility] over toilet. Pt able to void bladder sitting on toilet. Clothing management performed with total A from PT for time management.   Pt transported to rehab gym in [facility]. Squat pivot transfer to mat table with min-mod assist and LLE blocked. Pt performed sit<>stand from EOB with RW and mod assist from PT to block the LLE. Weight shift R and L 2 x 5 Bil with cues for neutral knee position on the L side to prevent buckling and GR. Partal squat x 8 with LLE blocked to prevent buckling and BUE supported on RW. Performed sit<>stand x 5 with mod assist to block the LLE and cues for improved sequencing of trunkal movement to prevent posterior LOB with fatigue.   PT donned anterior support AFO. Performed sit<>stand with mod assist AND stepping x 3 BLE with noted buckling on the LE side for all steps with RLE. PT applied thursane posterior support AFO with noted improved hip and knee  control with pregait stepping x  3 BLE. Gait training with RW, L hand splint and L AFO x 14f with max assist for position LLE and cues to sustain hip and knee extension on the LLE in stance.   Gait training at rail in hall 2 x 125fwith mod assist for management of the  LLEand cues for improved posture and sustained knee extension on the LLE and cues for weight shift to the R to advacne the LLE. +2 to follow with WC for safety.   [facility] returned to room and [facility] reach and all needs met.         Therapy Documentation Precautions:  Precautions Precautions: Fall Precaution Comments: Left Hemiparesis, Left knee buckles, support left arm in standing Restrictions Weight Bearing Restrictions: No  Pain: Pain Assessment Pain Scale: 0-10 Pain Score: 0-No pain    Therapy/Group: Individual Therapy  AuLorie Phenix/12/2022, 10:52 AM

## 2022-06-09 NOTE — Discharge Instructions (Signed)
Inpatient Rehab Discharge Instructions  Shatori Bertucci Discharge date and time: No discharge date for patient encounter.   Activities/Precautions/ Functional Status: Activity: no lifting, driving, or strenuous exercise till cleared by MD.  Diet: cardiac diet and diabetic diet Wound Care: none needed   Functional status:  ___ No restrictions     ___ Walk up steps independently ___ 24/7 supervision/assistance   ___ Walk up steps with assistance ___ Intermittent supervision/assistance  ___ Bathe/dress independently ___ Walk with walker     ___ Bathe/dress with assistance ___ Walk Independently    ___ Shower independently ___ Walk with assistance    ___ Shower with assistance ___ No alcohol     ___ Return to work/school ________  Special Instructions:  STROKE/TIA DISCHARGE INSTRUCTIONS SMOKING Cigarette smoking nearly doubles your risk of having a stroke & is the single most alterable risk factor  If you smoke or have smoked in the last 12 months, you are advised to quit smoking for your health. Most of the excess cardiovascular risk related to smoking disappears within a year of stopping. Ask you doctor about anti-smoking medications [place] Quit Line: 1-800-QUIT NOW Free Smoking Cessation Classes [phone]  CHOLESTEROL Know your levels; limit fat & cholesterol in your diet  Lipid Panel     Component Value Date/Time   CHOL 181 05/30/2022 0541   TRIG 103 05/30/2022 0541   TRIG 72 04/28/2006 0951   HDL 37 (L) 05/30/2022 0541   CHOLHDL 4.9 05/30/2022 0541   VLDL 21 05/30/2022 0541   LDLCALC 123 (H) 05/30/2022 0541     Many patients benefit from treatment even if their cholesterol is at goal. Goal: Total Cholesterol (CHOL) less than 160 Goal:  Triglycerides (TRIG) less than 150 Goal:  HDL greater than 40 Goal:  LDL (LDLCALC) less than 100   BLOOD PRESSURE American Stroke Association blood pressure target is less that 120/80 mm/Hg  Your discharge blood pressure is:  BP: (!)  150/70 Monitor your blood pressure Limit your salt and alcohol intake Many individuals will require more than one medication for high blood pressure  DIABETES (A1c is a blood sugar average for last 3 months) Goal HGBA1c is under 7% (HBGA1c is blood sugar average for last 3 months)  Diabetes:     Lab Results  Component Value Date   HGBA1C 11.4 (H) 05/30/2022    Your HGBA1c can be lowered with medications, healthy diet, and exercise. Check your blood sugar as directed by your physician Call your physician if you experience unexplained or low blood sugars.  PHYSICAL ACTIVITY/REHABILITATION Goal is 30 minutes at least 4 days per week  Activity: No driving, Therapies: see above Return to work: N/A Activity decreases your risk of heart attack and stroke and makes your heart stronger.  It helps control your weight and blood pressure; helps you relax and can improve your mood. Participate in a regular exercise program. Talk with your doctor about the best form of exercise for you (dancing, walking, swimming, cycling).  DIET/WEIGHT Goal is to maintain a healthy weight  Your discharge diet is:  Diet Order             Diet Heart Room service appropriate? Yes; Fluid consistency: Thin  Diet effective now                  liquids Your height is:  Height: '5\' 4"'$  (162.6 cm) Your current weight is: Weight: 82.2 kg Your Body Mass Index (BMI) is:  BMI (Calculated): 31.09  Following the type of diet specifically designed for you will help prevent another stroke. Your goal weight is:   Your goal Body Mass Index (BMI) is 19-24. Healthy food habits can help reduce 3 risk factors for stroke:  High cholesterol, hypertension, and excess weight.  RESOURCES Stroke/Support Group:  Call [phone]   STROKE EDUCATION PROVIDED/REVIEWED AND GIVEN TO PATIENT Stroke warning signs and symptoms How to activate emergency medical system (call 911). Medications prescribed at discharge. Need for follow-up after  discharge. Personal risk factors for stroke. Pneumonia vaccine given:  Flu vaccine given:  My questions have been answered, the writing is legible, and I understand these instructions.  I will adhere to these goals & educational materials that have been provided to me after my discharge from the hospital.     My questions have been answered and I understand these instructions. I will adhere to these goals and the provided educational materials after my discharge from the hospital.  Patient/Caregiver Signature _______________________________ Date __________  Clinician Signature _______________________________________ Date __________  Please bring this form and your medication list with you to all your follow-up doctor's appointments.

## 2022-06-09 NOTE — Progress Notes (Signed)
Occupational Therapy Session Note  Patient Details  Name: [name] MRN: [id number] Date of Birth: October 12, 1960  Today's Date: 06/09/2022 OT Individual Time: 0935-1030 OT Individual Time Calculation (min): 55 min    Short Term Goals: Week 1:  OT Short Term Goal 1 (Week 1): Pt will improve ability to perform toilet transfer with 1 helper OT Short Term Goal 2 (Week 1): Pt will perform shower level bathing with no more than MOD A OT Short Term Goal 3 (Week 1): Pt will improve functional use of LUE to grasp/release 3/3 trials OT Short Term Goal 4 (Week 1): Pt will improve sit to stands for LB ADL with MIN A Week 2:     Skilled Therapeutic Interventions/Progress Updates:    1:1 Pt received in the w/c. PT taken to the gym and transferred with min A to the mat. Pt transitioned into supine on the mat. Focus on NMR with focus on left UE with ability to relax tone and active extensor mm. Performed PNF D1 and D2 movements with support at elbow against gravity.  Pt able to activate left UE in all planes with ability to relax tone. Also performed bridges with focus on symmetrical hip elevation and control of left LE. Also practiced bed mobility with cues for transitional movements with activation on trunk and left UE/LE. At [facility] focus sit stands with out UE support with terminal extension of left hip and knee. Transfer to the left in to the w/c with min A .  Therapy Documentation Precautions:  Precautions Precautions: Fall Precaution Comments: Left Hemiparesis, Left knee buckles, support left arm in standing Restrictions Weight Bearing Restrictions: No   Pain:  No c/o pain in session    Therapy/Group: Individual Therapy  Willeen Cass [facility] 06/09/2022, 3:18 PM

## 2022-06-09 NOTE — Progress Notes (Signed)
PROGRESS NOTE   Subjective/Complaints:  Tired from therapy , no new issues    ROS:  Denies CP, SOB, N/V/D. + nocturia neg dysuria    Objective:   No results found. No results for input(s): "WBC", "HGB", "HCT", "PLT" in the last 72 hours.  No results for input(s): "NA", "K", "CL", "CO2", "GLUCOSE", "BUN", "CREATININE", "CALCIUM" in the last 72 hours.   Intake/Output Summary (Last 24 hours) at 06/09/2022 0915 Last data filed at 06/08/2022 1801 Gross per 24 hour  Intake 597 ml  Output --  Net 597 ml         Physical Exam: Vital Signs Blood pressure (!) 150/70, pulse 63, temperature 98.5 F (36.9 C), temperature source Oral, resp. rate 18, height '5\' 4"'$  (1.626 m), weight 82.2 kg, SpO2 100 %.   General: No acute distress Mood and affect are appropriate Heart: Regular rate and rhythm no rubs murmurs or extra sounds Lungs: Clear to auscultation, breathing unlabored, no rales or wheezes Abdomen: Positive bowel sounds, soft nontender to palpation, nondistended Extremities: No clubbing, cyanosis, or edema   MSK:      No apparent deformity.  Moving all 4 extremities Neuro: Alert and oriented x3, mild dysarthria, CN 2-12 grossly intact Tender Left trap  Prior exam       Strength: RUE and RLE 5/5                 LUE: 2- shoulder abd, 3-/5 EF, 3-/5 EE, 1/5 WE, 2/5 FF, 1/5 FA - improved                LLE:  3-/5 HF, 2-/5 KE, 0/5 DF/EHL/ PF  Neurologic exam:  Cognition: AAO to person, place, time and event.  Language: Fluent, No substitutions or neoglisms. + mild dysarthria.        Assessment/Plan: 1. Functional deficits which require 3+ hours per day of interdisciplinary therapy in a comprehensive inpatient rehab setting. Physiatrist is providing close team supervision and 24 hour management of active medical problems listed below. Physiatrist and rehab team continue to assess barriers to discharge/monitor patient  progress toward functional and medical goals  Care Tool:  Bathing    Body parts bathed by patient: Left arm, Chest, Abdomen, Front perineal area, Buttocks, Right upper leg, Left upper leg, Face   Body parts bathed by helper: Left lower leg, Right lower leg, Buttocks, Right arm     Bathing assist Assist Level: Maximal Assistance - Patient 24 - 49%     Upper Body Dressing/Undressing Upper body dressing   What is the patient wearing?: Pull over shirt    Upper body assist Assist Level: Maximal Assistance - Patient 25 - 49%    Lower Body Dressing/Undressing Lower body dressing      What is the patient wearing?: Pants, Incontinence brief     Lower body assist Assist for lower body dressing: Total Assistance - Patient < 25%     Toileting Toileting    Toileting assist Assist for toileting: Moderate Assistance - Patient 50 - 74%     Transfers Chair/bed transfer  Transfers assist     Chair/bed transfer assist level: 2 Helpers  Locomotion Ambulation   Ambulation assist   Ambulation activity did not occur: Safety/medical concerns (limited 2/2 LLE deficits)          Walk 10 feet activity   Assist  Walk 10 feet activity did not occur: Safety/medical concerns (limited 2/2 LLE deficits)        Walk 50 feet activity   Assist Walk 50 feet with 2 turns activity did not occur: Safety/medical concerns (limited 2/2 LLE deficits)         Walk 150 feet activity   Assist Walk 150 feet activity did not occur: Safety/medical concerns (limited 2/2 LLE deficits)         Walk 10 feet on uneven surface  activity   Assist Walk 10 feet on uneven surfaces activity did not occur: Safety/medical concerns (limited 2/2 LLE deficits)         Wheelchair     Assist Is the patient using a wheelchair?: Yes (Per PT Eval) Type of Wheelchair: Manual           Wheelchair 50 feet with 2 turns activity    Assist        Assist Level: Dependent -  Patient 0% (Per PT Eval)   Wheelchair 150 feet activity     Assist      Assist Level: Dependent - Patient 0%   Blood pressure (!) 150/70, pulse 63, temperature 98.5 F (36.9 C), temperature source Oral, resp. rate 18, height '5\' 4"'$  (1.626 m), weight 82.2 kg, SpO2 100 %. Medical Problem List and Plan: 1. 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  Antithrombotics: -DVT/anticoagulation:  Pharmaceutical: Lovenox             -antiplatelet therapy: Plavix/Pletal 3. Pain Management:  Tylenol prn.  Mild tenderness Left trap  4. Mood/Behavior/Sleep:  LCSW to follow for evaluation and support.              -antipsychotic agents: N/A             --trazodone prn for insomnia.  5. Neuropsych/cognition: This patient is capable of making decisions on her own behalf. 6. Skin/Wound Care: Routine pressure relief measures.  7. Fluids/Electrolytes/Nutrition: Monitor I/O. Check CMET in am - stable AM labs  8. HTN; Monitor BP TID--avoid hypoperfusion.  --continue hydralazine, losartan, amlodipine and metoprolol - Mild HTN; monitor with therapies -2/6 controlled overall, continue current medications     06/09/2022    5:17 AM 06/08/2022    9:57 PM 06/08/2022    7:55 PM  Vitals with BMI  Systolic [phone]  Diastolic 70 60 58  Pulse 63  58      9. T2DM w/peripheral neuroapthy: Hgb A1C- 11.4 (improving >14 11/23) Poorly controlled.              --was on Trulicity weekly, lantus 30 units and novolog 8 units TID ac. --Continue insulin glargine 15 units BID with 5 units meal coverage TID. Titrate as indicated.              --am hypoglycemia noted. Will change SSI to sensitive.    - 2/6 increase meal coverage to 6 units Recent Labs    06/08/22 1644 06/08/22 2102  06/09/22 [place] control 2/8, may reduce glargine to 12U BID  10. Elevated lipids: On Lipitor and Zetia.  11. PVD: On pletal and atorvastatin. 12. H/o depression/anxiety: Continue Prozax with xanax/hydroxyzine prn.   -Reports mood is stable, monitor 13. Obesity: BMI-30. Educate on diet and wt loss to help promote health and mobility.  14. CKD: Baseline SCr 1.3-1.4. Avoid nephrotoxic agents.              --note rise in BUN--encourage fluid intake. Monitor with serial checks.    - 2/5: Cr 1.35/BUN 26, around her baseline, continue to monitor   15. Dyslipidemia: Continue Zetia/Lipitor.  --Refer to cardiology for  PCSK9? (Has seen Dr. [name] for PAD) 21. Anemia   -HGB 10.9, stable, continue to monitor      LOS: 5 days A FACE TO FACE EVALUATION WAS PERFORMED  Charlett Blake 06/09/2022, 9:15 AM

## 2022-06-09 NOTE — Progress Notes (Addendum)
Physical Therapy Session Note  Patient Details  Name: [name] MRN: [id number] Date of Birth: 08-24-1960  Today's Date: 06/09/2022 PT Individual Time: 1304-1403 PT Individual Time Calculation (min): 59 min   Short Term Goals: Week 1:  PT Short Term Goal 1 (Week 1): Pt will complete bed to chair transfer with ModA + 1 PT Short Term Goal 2 (Week 1): Pt will complete supine <> sit with CGA PT Short Term Goal 3 (Week 1): Pt will ambulate 10 ft with LRAD PT Short Term Goal 4 (Week 1): Pt will complete sit to stand with CGA  + LRAD  Skilled Therapeutic Interventions/Progress Updates:    Pt received sitting in recliner and agreeable to therapy. Pt transported to therapy gym dependently. Pt wearing LLE posterior support AFO throughout entirety of session.   Sit to stand transfers throughout today's session were mod A. Therapist providing L knee block in order to prevent buckling. Attempted stand pivot transfer w/ RW. Pt immediately exhibited L knee buckling when wb on LLE along w/ R knee buckling when turning the walker. Pt required Max A to complete transfer into wc. Remainder of the session, pt performed squat pivot transfers w/ Mod A. Therapist blocking L knee from buckling  NMR: Seated balance reaching task w/ 8 clips. Pt tasked with reaching RUE cross body and hooking clip on to bars.Pt performed this 3x w/ Min A from the therapist to facilitate upper trunk rotation to reach for clips. Pt cued to wb through LUE when reaching to encourage extension through UE w/ therapist assisting in keeping UE in place.     Standing balance w/ RW and Mod A Pt tolerated standing for <1 min. Pt demonstrated LE weakness and decreased LE control. Therapist observed L and R knee buckling. Pt expressed that her leg's felt weak especially the LLE.   Gait training: ~5 feet--> ~3 feet--> ~5 feet, pt required seated rest breaks in between each set. Pt ambulated using Right Hand Rail for UE support and Max A from  therapist. Therapist facilitating R wt shift at pelvis to ensure L foot clearance, upper trunk extension, hand on assist in advancing the pt's LLE and L knee block in stance to prevent inevitable buckling. Pt exhibited increased flexion synergy of LLE and increased DF.   Pt exhibited increased fatigue throughout session. Due to this the pt was returned to room dependently in wc and left supine in bed w/ bed alarm on, call bell in reach and all needs met.     Therapy Documentation Precautions:  Precautions Precautions: Fall Precaution Comments: Left Hemiparesis, Left knee buckles, support left arm in standing Restrictions Weight Bearing Restrictions: No General: PT Amount of Missed Time (min): 12 Minutes PT Missed Treatment Reason: Patient fatigue Vital Signs:  Pain: Moderate L knee pain          Therapy/Group: Individual Therapy  Darnette Lampron 06/09/2022, 2:07 PM

## 2022-06-10 DIAGNOSIS — F064 Anxiety disorder due to known physiological condition: Secondary | ICD-10-CM | POA: Insufficient documentation

## 2022-06-10 LAB — URINALYSIS, W/ REFLEX TO CULTURE (INFECTION SUSPECTED)
Bilirubin Urine: NEGATIVE
Glucose, UA: 100 mg/dL — AB
Ketones, ur: NEGATIVE mg/dL
Leukocytes,Ua: NEGATIVE
Nitrite: NEGATIVE
Protein, ur: >300 mg/dL — AB
Specific Gravity, Urine: 1.02 (ref 1.005–1.030)
pH: 5.5 (ref 5.0–8.0)

## 2022-06-10 LAB — CBC WITH DIFFERENTIAL/PLATELET
Abs Immature Granulocytes: 0.03 10*3/uL (ref 0.00–0.07)
Basophils Absolute: 0 10*3/uL (ref 0.0–0.1)
Basophils Relative: 0 %
Eosinophils Absolute: 0.1 10*3/uL (ref 0.0–0.5)
Eosinophils Relative: 1 %
HCT: 33.9 % — ABNORMAL LOW (ref 36.0–46.0)
Hemoglobin: 11.4 g/dL — ABNORMAL LOW (ref 12.0–15.0)
Immature Granulocytes: 0 %
Lymphocytes Relative: 35 %
Lymphs Abs: 3.3 10*3/uL (ref 0.7–4.0)
MCH: 25.8 pg — ABNORMAL LOW (ref 26.0–34.0)
MCHC: 33.6 g/dL (ref 30.0–36.0)
MCV: 76.7 fL — ABNORMAL LOW (ref 80.0–100.0)
Monocytes Absolute: 1 10*3/uL (ref 0.1–1.0)
Monocytes Relative: 11 %
Neutro Abs: 4.9 10*3/uL (ref 1.7–7.7)
Neutrophils Relative %: 53 %
Platelets: 281 10*3/uL (ref 150–400)
RBC: 4.42 MIL/uL (ref 3.87–5.11)
RDW: 15.4 % (ref 11.5–15.5)
WBC: 9.3 10*3/uL (ref 4.0–10.5)
nRBC: 0 % (ref 0.0–0.2)

## 2022-06-10 LAB — BASIC METABOLIC PANEL
Anion gap: 8 (ref 5–15)
BUN: 28 mg/dL — ABNORMAL HIGH (ref 8–23)
CO2: 23 mmol/L (ref 22–32)
Calcium: 9.8 mg/dL (ref 8.9–10.3)
Chloride: 106 mmol/L (ref 98–111)
Creatinine, Ser: 1.31 mg/dL — ABNORMAL HIGH (ref 0.44–1.00)
GFR, Estimated: 46 mL/min — ABNORMAL LOW (ref >60)
Glucose, Bld: 114 mg/dL — ABNORMAL HIGH (ref 70–99)
Potassium: 4.1 mmol/L (ref 3.5–5.1)
Sodium: 137 mmol/L (ref 135–145)

## 2022-06-10 LAB — GLUCOSE, CAPILLARY
Glucose-Capillary: 98 mg/dL (ref 70–99)
Glucose-Capillary: 94 mg/dL (ref 70–99)
Glucose-Capillary: 251 mg/dL — ABNORMAL HIGH (ref 70–99)
Glucose-Capillary: 103 mg/dL — ABNORMAL HIGH (ref 70–99)

## 2022-06-10 MED ORDER — OXYCODONE HCL 5 MG PO TABS
5.0000 mg | ORAL_TABLET | ORAL | Status: DC | PRN
  Administered 2022-06-10 – 2022-06-12 (×5): 5 mg via ORAL
  Filled 2022-06-10 (×5): qty 1

## 2022-06-10 MED ORDER — SODIUM CHLORIDE 0.9 % IV SOLN: INTRAVENOUS | Status: DC

## 2022-06-10 NOTE — Progress Notes (Signed)
UA negative. Labs show evidence of dehydration with rise in BUN/Hgb--Will add for IVF for nocturnal hydration Fri, Sat, Sun and recheck labs on Mon.

## 2022-06-10 NOTE — Progress Notes (Signed)
Patient this am c/o severe pain in left upper extremity, new orders for UA with reflex to culture. Sample collected patient given am meds with Prn tylenol for pain  Md notified see new orders. Patient in bed resting A&ox4  plan of care in progress with all need met call bell in reach with safety ensured

## 2022-06-10 NOTE — Progress Notes (Signed)
Physical Therapy Session Note  Patient Details  Name: [name] MRN: [id number] Date of Birth: 08-31-1960  Today's Date: 06/10/2022 PT Individual Time: [id number] PT Individual Time Calculation (min): 44 min  and Today's Date: 06/10/2022 PT Missed Time: 31 Minutes Missed Time Reason: Patient ill (Comment);Pain  Short Term Goals: Week 1:  PT Short Term Goal 1 (Week 1): Pt will complete bed to chair transfer with ModA + 1 PT Short Term Goal 2 (Week 1): Pt will complete supine <> sit with CGA PT Short Term Goal 3 (Week 1): Pt will ambulate 10 ft with LRAD PT Short Term Goal 4 (Week 1): Pt will complete sit to stand with CGA  + LRAD   Skilled Therapeutic Interventions/Progress Updates:  Patient reclined back in recliner with RN assisting pt  on entrance to room. Pt experiencing significant pain she indicates in her L arm and leg. Mostly indicated with pointing to L shoulder and arm. Grabs L shoulder and arm and starts to wail in pain. Dynamap brought to RN with reading of 145/ 70. RN leaves to find MD. While RN gone, pt relates that arm feels as though it is extremely swollen but is not TTP. Does not feel hot to pt and is not warm to touch.   STEDY used to assist to bed. She is able to perform sit<>stand in STEDY with CGA. Assisted into supine as she is crying again from pain in LUE. Positioned pt's arm in scapular plane with pillows for increased comfort.   Provided comfort and emotional support to pt during in/out cath for urine collection for UA ordered from MD. Explained to pt that lab techs will arrive to draw blood for additional tests to hopefully rule out infections. Pt nodded in understanding.   During in/out cath, pt noted to have small bowel mvmt. Performs roll to R side with ModA +2 and roll to L side with CGA/ MinA. No pain complaint. Brief changed and pericare performed with MaxA.   Patient supine in bed at end of session with brakes locked, bed alarm set, and all needs within  reach. Reminded pt to use call bell for any needs.   Therapy Documentation Precautions:  Precautions Precautions: Fall Precaution Comments: Left Hemiparesis, Left knee buckles, support left arm in standing Restrictions Weight Bearing Restrictions: No General: PT Amount of Missed Time (min): 31 Minutes PT Missed Treatment Reason: Patient ill (Comment);Pain Vital Signs: Therapy Vitals Temp: 98.3 F (36.8 C) Temp Source: Oral Pulse Rate: 64 Resp: (!) 24 BP: (!) 158/69 Patient Position (if appropriate): Lying Oxygen Therapy SpO2: 100 % O2 Device: Room Air Pain: Pain Assessment Pain Score: 6   Therapy/Group: Individual Therapy  Alger Simons PT, DPT, CSRS 06/10/2022, 9:54 AM

## 2022-06-10 NOTE — Progress Notes (Signed)
PROGRESS NOTE   Subjective/Complaints:  Tired from therapy , no new issues  Pain on left side of body including face, pt has noted this since admit to rehab, denies new symptom today  Slept poorly last night  Had incont BM x 2   ROS:  Denies CP, SOB, N/V/D. + nocturia neg dysuria    Objective:   No results found. No results for input(s): "WBC", "HGB", "HCT", "PLT" in the last 72 hours.  No results for input(s): "NA", "K", "CL", "CO2", "GLUCOSE", "BUN", "CREATININE", "CALCIUM" in the last 72 hours.   Intake/Output Summary (Last 24 hours) at 06/10/2022 [id number] Last data filed at 06/09/2022 1812 Gross per 24 hour  Intake 358 ml  Output --  Net 358 ml         Physical Exam: Vital Signs Blood pressure (!) 158/69, pulse 64, temperature 98.3 F (36.8 C), temperature source Oral, resp. rate (!) 24, height 5' 4"$  (1.626 m), weight 82.2 kg, SpO2 100 %.   General: No acute distress Mood and affect are appropriate Heart: Regular rate and rhythm no rubs murmurs or extra sounds Lungs: Clear to auscultation, breathing unlabored, no rales or wheezes Abdomen: Positive bowel sounds, soft nontender to palpation, nondistended Extremities: No clubbing, cyanosis, or edema   MSK:      No apparent deformity.  Moving all 4 extremities Neuro: Alert and oriented x3, mild dysarthria, CN 2-12 grossly intact Tender Left trap  Prior exam       Strength: RUE and RLE 5/5                 LUE: 2- shoulder abd, 3-/5 EF, 3-/5 EE, 1/5 WE, 2/5 FF, 1/5 FA - improved Motor exam today as above except 1/5 in SA, EF, EE (may be related to lethargy)                 LLE:  3-/5 HF, 2-/5 KE, 0/5 DF/EHL/ PF  Unchanged except HF trace ( ? Cognition)  Neurologic exam:  Cognition: AAO to person, place, time and event.  Language: Fluent, No substitutions or neoglisms. + mild dysarthria.        Assessment/Plan: 1. Functional deficits which require 3+  hours per day of interdisciplinary therapy in a comprehensive inpatient rehab setting. Physiatrist is providing close team supervision and 24 hour management of active medical problems listed below. Physiatrist and rehab team continue to assess barriers to discharge/monitor patient progress toward functional and medical goals  Care Tool:  Bathing    Body parts bathed by patient: Left arm, Chest, Abdomen, Front perineal area, Buttocks, Right upper leg, Left upper leg, Face   Body parts bathed by helper: Left lower leg, Right lower leg, Buttocks, Right arm     Bathing assist Assist Level: Maximal Assistance - Patient 24 - 49%     Upper Body Dressing/Undressing Upper body dressing   What is the patient wearing?: Pull over shirt    Upper body assist Assist Level: Maximal Assistance - Patient 25 - 49%    Lower Body Dressing/Undressing Lower body dressing      What is the patient wearing?: Pants, Incontinence brief     Lower body  assist Assist for lower body dressing: Total Assistance - Patient < 25%     Toileting Toileting    Toileting assist Assist for toileting: Moderate Assistance - Patient 50 - 74%     Transfers Chair/bed transfer  Transfers assist     Chair/bed transfer assist level: 2 Helpers     Locomotion Ambulation   Ambulation assist   Ambulation activity did not occur: Safety/medical concerns (limited 2/2 LLE deficits)          Walk 10 feet activity   Assist  Walk 10 feet activity did not occur: Safety/medical concerns (limited 2/2 LLE deficits)        Walk 50 feet activity   Assist Walk 50 feet with 2 turns activity did not occur: Safety/medical concerns (limited 2/2 LLE deficits)         Walk 150 feet activity   Assist Walk 150 feet activity did not occur: Safety/medical concerns (limited 2/2 LLE deficits)         Walk 10 feet on uneven surface  activity   Assist Walk 10 feet on uneven surfaces activity did not occur:  Safety/medical concerns (limited 2/2 LLE deficits)         Wheelchair     Assist Is the patient using a wheelchair?: Yes (Per PT Eval) Type of Wheelchair: Manual           Wheelchair 50 feet with 2 turns activity    Assist        Assist Level: Dependent - Patient 0% (Per PT Eval)   Wheelchair 150 feet activity     Assist      Assist Level: Dependent - Patient 0%   Blood pressure (!) 158/69, pulse 64, temperature 98.3 F (36.8 C), temperature source Oral, resp. rate (!) 24, height 5' 4"$  (1.626 m), weight 82.2 kg, SpO2 100 %. Medical Problem List and Plan: 1. 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  Antithrombotics: -DVT/anticoagulation:  Pharmaceutical: Lovenox             -antiplatelet therapy: Plavix/Pletal 3. Pain Management:  Tylenol prn.  Mild tenderness Left trap  4. Mood/Behavior/Sleep:  LCSW to follow for evaluation and support.              -antipsychotic agents: N/A             --trazodone prn for insomnia.  5. Neuropsych/cognition: This patient is capable of making decisions on her own behalf. 6. Skin/Wound Care: Routine pressure relief measures.  7. Fluids/Electrolytes/Nutrition: Monitor I/O. Check CMET in am - stable AM labs  8. HTN; Monitor BP TID--avoid hypoperfusion.  --continue hydralazine, losartan, amlodipine and metoprolol - Mild HTN; monitor with therapies -2/6 controlled overall, continue current medications     06/10/2022    6:46 AM 06/09/2022   10:52 PM 06/09/2022    7:41 PM  Vitals with BMI  Systolic 0000000 [id number]  Diastolic 69 76 79  Pulse 64  66      9. T2DM w/peripheral neuroapthy: Hgb A1C- 11.4  (improving >14 11/23)  Poorly controlled.              --was on Trulicity weekly, lantus 30 units and novolog 8 units TID ac. --Continue insulin glargine 15 units BID with 5 units meal coverage TID. Titrate as indicated.              --am hypoglycemia noted. Will change SSI to sensitive.    - 2/6 increase meal coverage to 6 units Recent Labs    06/09/22 1644 06/09/22 2104 06/10/22 0645  GLUCAP 103* 169* 98     Controlled 2/9 10. Elevated lipids: On Lipitor and Zetia.  11. PVD: On pletal and atorvastatin. 12. H/o depression/anxiety: Continue Prozax with xanax/hydroxyzine prn.   -Reports mood is stable, monitor 13. Obesity: BMI-30. Educate on diet and wt loss to help promote health and mobility.  14. CKD: Baseline SCr 1.3-1.4. Avoid nephrotoxic agents.              --note rise in BUN--encourage fluid intake. Monitor with serial checks.    - 2/5: Cr 1.35/BUN 26, around her baseline, continue to monitor   15. Dyslipidemia: Continue Zetia/Lipitor.  --Refer to cardiology for  PCSK9? (Has seen Dr. [name] for PAD) 59. Anemia   -HGB 10.9, stable, continue to monitor      LOS: 6 days A FACE TO FACE EVALUATION WAS PERFORMED  Charlett Blake 06/10/2022, 8:22 AM

## 2022-06-10 NOTE — Progress Notes (Signed)
Physical Therapy Session Note  Patient Details  Name: [name] MRN: [id number] Date of Birth: [birth year]  Today's Date: 06/10/2022 PT Individual Time: 1420-1515 PT Individual Time Calculation (min): 55 min   Short Term Goals: Week 1:  PT Short Term Goal 1 (Week 1): Pt will complete bed to chair transfer with ModA + 1 PT Short Term Goal 2 (Week 1): Pt will complete supine <> sit with CGA PT Short Term Goal 3 (Week 1): Pt will ambulate 10 ft with LRAD PT Short Term Goal 4 (Week 1): Pt will complete sit to stand with CGA  + LRAD   Skilled Therapeutic Interventions/Progress Updates:  Patient seated upright in w/c on entrance to room. Patient alert and agreeable to PT session. However complains of consistent diarrhea this afternoon and relates need to return to toilet. Pt curious re: results of UA and blood testing. Looked up results in [place] and informed pt that no reports are completed yet, but appears that all UA results lean to pt's CKD, and blood work does not appear to indicate infection with agreement form Therapist, sports. But will defer to MD notes/ report for true analysis of UA and bloodwork.   Patient with no pain complaint at start of session. Relates that L arm does hurt and feel swollen but "not like before". Relates anxiety over arm function and if it will return. Tries to remain hopeful that arm pain is indication of sensory/ motor return.   Need for BM diminishes during session and then returns requesting for assistance to toilet.   Therapeutic Activity: Transfers: D/t pain, pt transferred to toilet using STEDY and demos ability to perform sit<>stand with CGA with pull-to-stand technique and knee block. Provided vc for effort. Performed x5 in STEDY throughout session. Pt also performs stance in STEDY with no UE support but knee block from STEDY knee supports with close supervision.   Pericare, brief change and clothing mgmt with MaxA.   Patient not ready to return to bed d/t potential  for continued need to toilet. So, seated upright with BLE elevated in recliner at end of session with brakes locked, seat pad alarm set, and all needs within reach. Provided with water per pt request.   Therapy Documentation Precautions:  Precautions Precautions: Fall Precaution Comments: Left Hemiparesis, Left knee buckles, support left arm in standing Restrictions Weight Bearing Restrictions: No General:   Vital Signs:  Pain: Pain Assessment Pain Score: 0-No pain  Therapy/Group: Individual Therapy  Alger Simons PT, DPT, CSRS 06/10/2022, 2:20 PM

## 2022-06-10 NOTE — Progress Notes (Signed)
Occupational Therapy Session Note  Patient Details  Name: [name] MRN: [id number] Date of Birth: 04-Aug-1960  Today's Date: 06/10/2022 OT Individual Time: 1110-1205 OT Individual Time Calculation (min): 55 min    Short Term Goals: Week 1:  OT Short Term Goal 1 (Week 1): Pt will improve ability to perform toilet transfer with 1 helper OT Short Term Goal 2 (Week 1): Pt will perform shower level bathing with no more than MOD A OT Short Term Goal 3 (Week 1): Pt will improve functional use of LUE to grasp/release 3/3 trials OT Short Term Goal 4 (Week 1): Pt will improve sit to stands for LB ADL with MIN A  Skilled Therapeutic Interventions/Progress Updates:    Pt received in bed with her sister and best friend present. They will be her main caregivers at home.  Pt discussed how she was not feeling well this am and had significant arm pain.  Pt agreeable to letting me try gentle ROM.[name] began with scapular gliding and progressed to finger tips. Pt able to tolerate all ROM with no c/o pain.  She does have mod hypertone in elbow flexors with elbow resting at 90 degrees.  Initially biceps not able to relax for extension.  Had pt work on inhaling and exhaling and during exhale phase I would open arm further. Able to achieve full ROM with 3 breaths.  Pt then practiced doing this herself using R hand to support L wrist and she too was able to extend arm fully.   Pt then was agreeable to sitting to EOB with min A.  Pt tolerated sitting with close S for over 35 min as she engaged in bathing (mod A) donning shirt (min -mod) and pants max A.  During self care, pt able to abduct arm 30 degrees.  Pt completed 5 sit to stands with min A and then min -mod static balance during cleansing and changing LB clothing.   Pt sitting towards end of bed. RN assisted me as I blocked her L knee as pt practiced taking 4 side steps to her L to [facility].  She moved to supine with min A.   BP 157/82 during session.  No c/o  dizziness. Pt in bed with nurse with her.  Therapy Documentation Precautions:  Precautions Precautions: Fall Precaution Comments: Left Hemiparesis, Left knee buckles, support left arm in standing Restrictions Weight Bearing Restrictions: No    Pain: Pain Assessment Pain Score: 0-No pain ADL: ADL Eating: Set up Grooming: Minimal assistance Upper Body Bathing: Minimal assistance Lower Body Bathing: Maximal assistance Upper Body Dressing: Maximal assistance Lower Body Dressing: Maximal assistance Toileting: Maximal assistance Toilet Transfer: Dependent   Therapy/Group: Individual Therapy  [place] 06/10/2022, 1:09 PM

## 2022-06-11 ENCOUNTER — Inpatient Hospital Stay (HOSPITAL_COMMUNITY): Payer: Commercial Managed Care - HMO

## 2022-06-11 DIAGNOSIS — R471 Dysarthria and anarthria: Secondary | ICD-10-CM

## 2022-06-11 LAB — GLUCOSE, CAPILLARY
Glucose-Capillary: 93 mg/dL (ref 70–99)
Glucose-Capillary: 185 mg/dL — ABNORMAL HIGH (ref 70–99)
Glucose-Capillary: 207 mg/dL — ABNORMAL HIGH (ref 70–99)
Glucose-Capillary: 161 mg/dL — ABNORMAL HIGH (ref 70–99)

## 2022-06-11 MED ORDER — METHOCARBAMOL 500 MG PO TABS
500.0000 mg | ORAL_TABLET | Freq: Three times a day (TID) | ORAL | Status: DC | PRN
  Administered 2022-06-12: 500 mg via ORAL
  Filled 2022-06-11: qty 1

## 2022-06-11 NOTE — Progress Notes (Signed)
Physical Therapy Session Note  Patient Details  Name: [name] MRN: [id number] Date of Birth: Dec 28, 1960  Today's Date: 06/11/2022 PT Individual Time: [id number] PT Individual Time Calculation (min): 53 min   Short Term Goals: Week 1:  PT Short Term Goal 1 (Week 1): Pt will complete bed to chair transfer with ModA + 1 PT Short Term Goal 2 (Week 1): Pt will complete supine <> sit with CGA PT Short Term Goal 3 (Week 1): Pt will ambulate 10 ft with LRAD PT Short Term Goal 4 (Week 1): Pt will complete sit to stand with CGA  + LRAD Week 2:    Week 3:     Skilled Therapeutic Interventions/Progress Updates:   Pt received supine in bed and agreeable to PT. Upon entering room pt noted to have increased slurred speech compared to when this PT last treated pt on 06/09/22. Spoke with RN and additional PT regarding change in vocal quality, and they both report that pt's speech was slurred yesterday, but improved mildly when patient was not emotionally Labile. RN performed assessment of vocal quality and pt continued to slur all words and demonstrated mild difficulty coordinating words and breath, but was able to say all words asked by RN.    VS assessed in supine  158/77 HR 67 SPO2 100% Pt performed supine NMR on the RLE for hip/knee flexion/extension, hip abduction/adduction each performed 2 x 10 with noted difficulty in hip flexion. Attempted SAQ, but pt unable to activate isolated knee extension on this day. On prior session witnessed by this PT, pt was able to partially activate knee extension in isolation.   Supine>sit with max assist from PT for control of trunk and LLE/LUE. Sitting balance EOB x 10 minutes. Min assist progressing to supervision assist, then mod assist x 1 to prevent L/posterior LOB.  PT performed orthostatic VS assessment  Sitting 154/82 HR 71 SpO2 99%  Standing 172/87 HR 83 .  No s/s of orhtostatic hypotension or new neurologic s/s with change in position  Sit<>stand  transfer performed with max assist from PT to block the LLE and RUE supported on HW with +2 to stabilize walker. Max assist to return to sitting on EOB. Max assist lateral scoot to [facility] with LLE blocked. Sit>supine with total A+2 for control of trunk and LLE. When back in bed, pt reporting numbness on the L side of Face. Pt reports that this has been her complaint for several days, but noted from last several days indicate facial pain vs numbess RN made aware.  Pt left supine in bed with call bell reach and all needs met.      Therapy Documentation Precautions:  Precautions Precautions: Fall Precaution Comments: Left Hemiparesis, Left knee buckles, support left arm in standing Restrictions Weight Bearing Restrictions: No    Vital Signs: Therapy Vitals Temp: 98.5 F (36.9 C) Temp Source: Oral Pulse Rate: 73 Resp: 18 BP: (!) 144/66 Patient Position (if appropriate): Lying Oxygen Therapy SpO2: 97 % O2 Device: Room Air Pain:  denies   Therapy/Group: Individual Therapy  Lorie Phenix 06/11/2022, 5:42 PM

## 2022-06-11 NOTE — Progress Notes (Signed)
This nurse called to room to assess patient PT endorses change in speech quality and delayed responses. Patient alert and oriented with all needs met. On Call Md notified awaiting orders at this time. Current plan of care in progress. Safety ensured.

## 2022-06-11 NOTE — Progress Notes (Signed)
Physical Therapy Session Note  Patient Details  Name: [name] MRN: [id number] Date of Birth: April 28, 1961  Today's Date: 06/11/2022 PT Individual Time: 0808-0905 PT Individual Time Calculation (min): 57 min   Short Term Goals: Week 1:  PT Short Term Goal 1 (Week 1): Pt will complete bed to chair transfer with ModA + 1 PT Short Term Goal 2 (Week 1): Pt will complete supine <> sit with CGA PT Short Term Goal 3 (Week 1): Pt will ambulate 10 ft with LRAD PT Short Term Goal 4 (Week 1): Pt will complete sit to stand with CGA  + LRAD   Skilled Therapeutic Interventions/Progress Updates:  Patient supine in bed on entrance to room. Patient alert and relates feeling very weak today. IV fluids running as these have been started for run overnight for dehydration. Agreeable to PT session. Patient with no pain complaint at start of session. But complains of feeling weak.  Supine-->sit performed with pt requiring MaxA to bring LLE off EOB and for bringing UB up to seated position on EOB. Pt with decreased trunk control today and LOB posteriorly and anteriorly requiring MinA to maintain seated balance. Pt relates need to toilet.   STEDY used for toilet transfer and pt requiring CGA to complete. Pt complains of neck pain on L side and palpation reveals increase in tone. MaxA for pericare and brief change. Transferred back to bed initially to get dressed for day. Awaiting stop of IV fluid to get changed for day.   Seated balance at EOB is improved from initial sitting. Initiates return to supine and assisted with ModA for LLE back to bed surface. Pt agreeable to thermotherapy to L neck. Asks if she can have muscle relaxer as pain is increasing. RN notified as to pt's pain complaint as well as changes back and forth since previous day.   Pt supine in bed and receives phone call from sister, then calls best friend. Is demonstrating increased anxiety and crying with decreased ability to verbalize how she is  feeling. Once calmed again, pt able to take meds from RN. Positioned upright for more appropriate positioning to take meds.   Patient supine in bed at end of session with brakes locked, bed alarm set, and all needs within reach. RN and nursing student in room providing care.    Therapy Documentation Precautions:  Precautions Precautions: Fall Precaution Comments: Left Hemiparesis, Left knee buckles, support left arm in standing Restrictions Weight Bearing Restrictions: No General:   Vital Signs:  Pain: Pt initially complaining of increased L neck and shoulder pain.   Therapy/Group: Individual Therapy  Alger Simons PT, DPT, CSRS 06/11/2022, 12:57 PM

## 2022-06-11 NOTE — Progress Notes (Signed)
PROGRESS NOTE   Subjective/Complaints:  Slept ok last night, pain doing ok but mostly in her neck, requesting muscle relaxer. LBM yesterday. Urinating ok. Denies any other complaints or concerns.  Later in the day, nursing called and informed Dr. Marciano Sequin regarding concerns from therapy team that she had slurred speech-- unclear what her baseline is, pt oriented during exam this morning but did have mild dysarthria which has been reported in prior notes.    ROS: +neck pain. +nocturia neg dysuria. Denies CP, SOB, abd pain, N/V/D/C or any other complaints  Objective:   No results found. Recent Labs    06/10/22 1249  WBC 9.3  HGB 11.4*  HCT 33.9*  PLT 281    Recent Labs    06/10/22 1249  NA 137  K 4.1  CL 106  CO2 23  GLUCOSE 114*  BUN 28*  CREATININE 1.31*  CALCIUM 9.8     Intake/Output Summary (Last 24 hours) at 06/11/2022 0828 Last data filed at 06/10/2022 1300 Gross per 24 hour  Intake 480 ml  Output --  Net 480 ml         Physical Exam: Vital Signs Blood pressure (!) 145/74, pulse 62, temperature 98.1 F (36.7 C), temperature source Oral, resp. rate 16, height 5' 4"$  (1.626 m), weight 82.2 kg, SpO2 97 %.   General: No acute distress, laying in bed Mood and affect are appropriate Heart: Regular rate and rhythm no rubs murmurs or extra sounds Lungs: Clear to auscultation, breathing unlabored, no rales or wheezes Abdomen: Positive bowel sounds, soft nontender to palpation, nondistended Extremities: No clubbing, cyanosis, or edema   MSK:      No apparent deformity. Moving all 4 extremities, able to manipulate cell phone with RUE Neuro: Alert and oriented x3, mild dysarthria, CN 2-12 grossly intact Tender Left trap  Prior exam       Strength: RUE and RLE 5/5                 LUE: 2- shoulder abd, 3-/5 EF, 3-/5 EE, 1/5 WE, 2/5 FF, 1/5 FA - improved Motor exam today as above except 1/5 in SA, EF, EE (may  be related to lethargy)                 LLE:  3-/5 HF, 2-/5 KE, 0/5 DF/EHL/ PF  Unchanged except HF trace ( ? Cognition)  Neurologic exam:  Cognition: AAO to person, place, time and event.  Language: Fluent, No substitutions or neoglisms. + mild dysarthria.        Assessment/Plan: 1. Functional deficits which require 3+ hours per day of interdisciplinary therapy in a comprehensive inpatient rehab setting. Physiatrist is providing close team supervision and 24 hour management of active medical problems listed below. Physiatrist and rehab team continue to assess barriers to discharge/monitor patient progress toward functional and medical goals  Care Tool:  Bathing    Body parts bathed by patient: Left arm, Chest, Abdomen, Front perineal area, Buttocks, Right upper leg, Left upper leg, Face   Body parts bathed by helper: Left lower leg, Right lower leg, Buttocks, Right arm     Bathing assist Assist Level: Maximal Assistance -  Patient 24 - 49%     Upper Body Dressing/Undressing Upper body dressing   What is the patient wearing?: Pull over shirt    Upper body assist Assist Level: Maximal Assistance - Patient 25 - 49%    Lower Body Dressing/Undressing Lower body dressing      What is the patient wearing?: Pants, Incontinence brief     Lower body assist Assist for lower body dressing: Total Assistance - Patient < 25%     Toileting Toileting    Toileting assist Assist for toileting: Moderate Assistance - Patient 50 - 74%     Transfers Chair/bed transfer  Transfers assist     Chair/bed transfer assist level: 2 Helpers     Locomotion Ambulation   Ambulation assist   Ambulation activity did not occur: Safety/medical concerns (limited 2/2 LLE deficits)          Walk 10 feet activity   Assist  Walk 10 feet activity did not occur: Safety/medical concerns (limited 2/2 LLE deficits)        Walk 50 feet activity   Assist Walk 50 feet with 2 turns  activity did not occur: Safety/medical concerns (limited 2/2 LLE deficits)         Walk 150 feet activity   Assist Walk 150 feet activity did not occur: Safety/medical concerns (limited 2/2 LLE deficits)         Walk 10 feet on uneven surface  activity   Assist Walk 10 feet on uneven surfaces activity did not occur: Safety/medical concerns (limited 2/2 LLE deficits)         Wheelchair     Assist Is the patient using a wheelchair?: Yes (Per PT Eval) Type of Wheelchair: Manual           Wheelchair 50 feet with 2 turns activity    Assist        Assist Level: Dependent - Patient 0% (Per PT Eval)   Wheelchair 150 feet activity     Assist      Assist Level: Dependent - Patient 0%   Blood pressure (!) 145/74, pulse 62, temperature 98.1 F (36.7 C), temperature source Oral, resp. rate 16, height 5' 4"$  (1.626 m), weight 82.2 kg, SpO2 97 %.  Medical Problem List and Plan: 1. 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  Antithrombotics: -DVT/anticoagulation:  Pharmaceutical: Lovenox 58m QD             -antiplatelet therapy: Plavix 786mQD, Pletal 5090mID 3. Pain Management:  Tylenol prn. Oxycodone 5mg26mh PRN ordered overnight 06/10/22 -Mild tenderness Left trap -06/11/22 ordered robaxin 500mg81m PRN d/t pt c/o muscle spasms 4. Mood/Behavior/Sleep:  LCSW to follow for evaluation and support.              -antipsychotic agents: N/A             --trazodone prn for insomnia.  5. Neuropsych/cognition: This  patient is capable of making decisions on her own behalf. 6. Skin/Wound  Care: Routine pressure relief measures.  7. Fluids/Electrolytes/Nutrition: Monitor I/O. Check CMET in am - stable labs 06/10/22, monitor on weekly labs  8. HTN; Monitor BP TID--avoid hypoperfusion.  --continue hydralazine 6m q8h, amlodipine 145mQD, and metoprolol 2520mD; losartan 23m42m not ordered currently - Mild HTN; monitor with therapies -06/11/22 BP stable/controlled overall, continue current medications  Vitals:   06/08/22 2157 06/09/22 0517 06/09/22 1659 06/09/22 1941  BP: 133/60 (!) 150/70 (!) 138/59 (!) 155/79   06/09/22 2252 06/10/22 0646 06/10/22 1530 06/10/22 1936  BP: (!) 156/76 (!) 158/69 (!) 140/63 137/63   06/10/22 2211 06/11/22 0341 06/11/22 0648 06/11/22 1422  BP: (!) 149/71 138/65 (!) 145/74 (!) 144/66      9. T2DM w/peripheral neuroapthy: Hgb A1C- 11.4 (improving >14 11/23) Poorly controlled.              --was on Trulicity weekly, lantus 30 units and novolog 8 units TID ac. --Continue insulin glargine 15 units BID with 5 units meal coverage TID. Titrate as indicated.              --am hypoglycemia noted. Will change SSI to sensitive.    - 2/6 increase meal coverage to 6 units  -Controlled 2/9 -06/11/22 currently on Glargine 12U BID and Novolog 6U TID WC with SSI TID AC+QHS, fair control, cont regimen Recent Labs    06/11/22 0606 06/11/22 1139 06/11/22 1622  GLUCAP 93 185* 207*     10. Elevated lipids: On Atorvastatin 80mg20mand Zetia 10mg 70m 11. PVD: On pletal and atorvastatin. 12. H/o depression/anxiety: Continue Prozac 20mg Q60mth xanax 0.25mg TI61mN/hydroxyzine 25mg q6h12m.   -Reports mood is stable, monitor 13. Obesity: BMI-30. Educate on diet and wt loss to help promote health and mobility.  14. CKD: Baseline SCr 1.3-1.4. Avoid nephrotoxic agents.              --note rise in BUN--encourage fluid intake. Monitor with serial checks.    - 2/5: Cr 1.35/BUN 26, around her  baseline, continue to monitor   -monitor on weekly labs starting 06/13/22  15. Dyslipidemia: Continue Zetia/Lipitor.  --Refer to cardiology for  PCSK9? (Has seen Dr. Berry forGwenlyn Found  16. Anemi46  -HGB 10.9, stable, continue to monitor on weekly labs next 06/13/22  17. Dysarthria: has been reported during her stay, but ?worsening -06/11/22 nursing stating that therapy team reported worsening slurred speech compared to prior exams, pt with mild dysarthria during exam but unclear what her baseline is-- she is oriented x4 during exam. Given reports of worsening weakness yesterday and possible worsening speech today, head CT ordered; doubt need for repeat labs since these were done yesterday. Will hold off on neuro consult for now, until head CT order returns.       LOS: 7 days A FACE TO FACE EVALWinn4, 8:28 AM

## 2022-06-12 ENCOUNTER — Inpatient Hospital Stay (HOSPITAL_COMMUNITY)
Admission: RE | Admit: 2022-06-12 | Discharge: 2022-06-21 | DRG: 064 | Disposition: A | Discharge status: Skilled Nursing Facility | Payer: Commercial Managed Care - HMO | Source: Intra-hospital | Attending: Internal Medicine | Admitting: Internal Medicine

## 2022-06-12 ENCOUNTER — Inpatient Hospital Stay (HOSPITAL_COMMUNITY): Payer: Commercial Managed Care - HMO

## 2022-06-12 ENCOUNTER — Encounter (HOSPITAL_COMMUNITY): Payer: Self-pay

## 2022-06-12 DIAGNOSIS — K219 Gastro-esophageal reflux disease without esophagitis: Secondary | ICD-10-CM | POA: Diagnosis present

## 2022-06-12 DIAGNOSIS — D573 Sickle-cell trait: Secondary | ICD-10-CM | POA: Diagnosis present

## 2022-06-12 DIAGNOSIS — I63542 Cerebral infarction due to unspecified occlusion or stenosis of left cerebellar artery: Principal | ICD-10-CM | POA: Diagnosis present

## 2022-06-12 DIAGNOSIS — I672 Cerebral atherosclerosis: Secondary | ICD-10-CM | POA: Diagnosis present

## 2022-06-12 DIAGNOSIS — R1310 Dysphagia, unspecified: Secondary | ICD-10-CM | POA: Diagnosis present

## 2022-06-12 DIAGNOSIS — J690 Pneumonitis due to inhalation of food and vomit: Secondary | ICD-10-CM | POA: Diagnosis present

## 2022-06-12 DIAGNOSIS — I6501 Occlusion and stenosis of right vertebral artery: Secondary | ICD-10-CM | POA: Diagnosis present

## 2022-06-12 DIAGNOSIS — E6609 Other obesity due to excess calories: Secondary | ICD-10-CM | POA: Diagnosis present

## 2022-06-12 DIAGNOSIS — Z7985 Long-term (current) use of injectable non-insulin antidiabetic drugs: Secondary | ICD-10-CM

## 2022-06-12 DIAGNOSIS — R471 Dysarthria and anarthria: Secondary | ICD-10-CM | POA: Diagnosis present

## 2022-06-12 DIAGNOSIS — Z90710 Acquired absence of both cervix and uterus: Secondary | ICD-10-CM

## 2022-06-12 DIAGNOSIS — Z8249 Family history of ischemic heart disease and other diseases of the circulatory system: Secondary | ICD-10-CM

## 2022-06-12 DIAGNOSIS — Z8572 Personal history of non-Hodgkin lymphomas: Secondary | ICD-10-CM | POA: Diagnosis not present

## 2022-06-12 DIAGNOSIS — R197 Diarrhea, unspecified: Secondary | ICD-10-CM | POA: Diagnosis not present

## 2022-06-12 DIAGNOSIS — I69354 Hemiplegia and hemiparesis following cerebral infarction affecting left non-dominant side: Secondary | ICD-10-CM | POA: Diagnosis not present

## 2022-06-12 DIAGNOSIS — N1831 Chronic kidney disease, stage 3a: Secondary | ICD-10-CM | POA: Diagnosis present

## 2022-06-12 DIAGNOSIS — E1122 Type 2 diabetes mellitus with diabetic chronic kidney disease: Secondary | ICD-10-CM | POA: Diagnosis present

## 2022-06-12 DIAGNOSIS — R4701 Aphasia: Secondary | ICD-10-CM

## 2022-06-12 DIAGNOSIS — Z823 Family history of stroke: Secondary | ICD-10-CM

## 2022-06-12 DIAGNOSIS — I63312 Cerebral infarction due to thrombosis of left middle cerebral artery: Secondary | ICD-10-CM

## 2022-06-12 DIAGNOSIS — I130 Hypertensive heart and chronic kidney disease with heart failure and stage 1 through stage 4 chronic kidney disease, or unspecified chronic kidney disease: Secondary | ICD-10-CM | POA: Diagnosis present

## 2022-06-12 DIAGNOSIS — R29716 NIHSS score 16: Secondary | ICD-10-CM | POA: Diagnosis present

## 2022-06-12 DIAGNOSIS — E1151 Type 2 diabetes mellitus with diabetic peripheral angiopathy without gangrene: Secondary | ICD-10-CM | POA: Diagnosis present

## 2022-06-12 DIAGNOSIS — E1165 Type 2 diabetes mellitus with hyperglycemia: Secondary | ICD-10-CM | POA: Diagnosis present

## 2022-06-12 DIAGNOSIS — G8191 Hemiplegia, unspecified affecting right dominant side: Secondary | ICD-10-CM | POA: Diagnosis present

## 2022-06-12 DIAGNOSIS — Z6831 Body mass index (BMI) 31.0-31.9, adult: Secondary | ICD-10-CM

## 2022-06-12 DIAGNOSIS — I10 Essential (primary) hypertension: Secondary | ICD-10-CM | POA: Diagnosis not present

## 2022-06-12 DIAGNOSIS — Z825 Family history of asthma and other chronic lower respiratory diseases: Secondary | ICD-10-CM

## 2022-06-12 DIAGNOSIS — R0603 Acute respiratory distress: Secondary | ICD-10-CM | POA: Diagnosis present

## 2022-06-12 DIAGNOSIS — R29810 Facial weakness: Secondary | ICD-10-CM | POA: Diagnosis present

## 2022-06-12 DIAGNOSIS — I639 Cerebral infarction, unspecified: Secondary | ICD-10-CM | POA: Diagnosis not present

## 2022-06-12 DIAGNOSIS — D509 Iron deficiency anemia, unspecified: Secondary | ICD-10-CM | POA: Diagnosis present

## 2022-06-12 DIAGNOSIS — Z7902 Long term (current) use of antithrombotics/antiplatelets: Secondary | ICD-10-CM

## 2022-06-12 DIAGNOSIS — E785 Hyperlipidemia, unspecified: Secondary | ICD-10-CM | POA: Diagnosis present

## 2022-06-12 DIAGNOSIS — I5032 Chronic diastolic (congestive) heart failure: Secondary | ICD-10-CM | POA: Diagnosis present

## 2022-06-12 DIAGNOSIS — Z833 Family history of diabetes mellitus: Secondary | ICD-10-CM

## 2022-06-12 DIAGNOSIS — Z794 Long term (current) use of insulin: Secondary | ICD-10-CM

## 2022-06-12 DIAGNOSIS — Z79899 Other long term (current) drug therapy: Secondary | ICD-10-CM

## 2022-06-12 DIAGNOSIS — I63012 Cerebral infarction due to thrombosis of left vertebral artery: Secondary | ICD-10-CM | POA: Diagnosis not present

## 2022-06-12 LAB — BASIC METABOLIC PANEL
Anion gap: 6 (ref 5–15)
BUN: 24 mg/dL — ABNORMAL HIGH (ref 8–23)
CO2: 21 mmol/L — ABNORMAL LOW (ref 22–32)
Calcium: 9.6 mg/dL (ref 8.9–10.3)
Chloride: 111 mmol/L (ref 98–111)
Creatinine, Ser: 1.3 mg/dL — ABNORMAL HIGH (ref 0.44–1.00)
GFR, Estimated: 46 mL/min — ABNORMAL LOW (ref >60)
Glucose, Bld: 104 mg/dL — ABNORMAL HIGH (ref 70–99)
Potassium: 4.1 mmol/L (ref 3.5–5.1)
Sodium: 138 mmol/L (ref 135–145)

## 2022-06-12 LAB — CBC
HCT: 32.9 % — ABNORMAL LOW (ref 36.0–46.0)
Hemoglobin: 11 g/dL — ABNORMAL LOW (ref 12.0–15.0)
MCH: 26.2 pg (ref 26.0–34.0)
MCHC: 33.4 g/dL (ref 30.0–36.0)
MCV: 78.3 fL — ABNORMAL LOW (ref 80.0–100.0)
Platelets: 244 10*3/uL (ref 150–400)
RBC: 4.2 MIL/uL (ref 3.87–5.11)
RDW: 15.9 % — ABNORMAL HIGH (ref 11.5–15.5)
WBC: 7 10*3/uL (ref 4.0–10.5)
nRBC: 0 % (ref 0.0–0.2)

## 2022-06-12 LAB — GLUCOSE, CAPILLARY
Glucose-Capillary: 102 mg/dL — ABNORMAL HIGH (ref 70–99)
Glucose-Capillary: 128 mg/dL — ABNORMAL HIGH (ref 70–99)

## 2022-06-12 LAB — PROCALCITONIN: Procalcitonin: <0.1 ng/mL

## 2022-06-12 MED ORDER — PANTOPRAZOLE SODIUM 40 MG IV SOLR: 40.0000 mg | Freq: Every day | INTRAVENOUS | Status: DC

## 2022-06-12 MED ORDER — INSULIN ASPART 100 UNIT/ML IJ SOLN
0.0000 [IU] | Freq: Three times a day (TID) | INTRAMUSCULAR | Status: DC
  Administered 2022-06-13: 2 [IU] via SUBCUTANEOUS
  Administered 2022-06-14 (×3): 3 [IU] via SUBCUTANEOUS
  Administered 2022-06-15: 2 [IU] via SUBCUTANEOUS
  Administered 2022-06-15 – 2022-06-16 (×3): 3 [IU] via SUBCUTANEOUS
  Administered 2022-06-16 – 2022-06-18 (×3): 2 [IU] via SUBCUTANEOUS
  Administered 2022-06-18 – 2022-06-19 (×4): 3 [IU] via SUBCUTANEOUS
  Administered 2022-06-19: 2 [IU] via SUBCUTANEOUS
  Administered 2022-06-20: 5 [IU] via SUBCUTANEOUS
  Administered 2022-06-20: 3 [IU] via SUBCUTANEOUS
  Administered 2022-06-20 – 2022-06-21 (×2): 2 [IU] via SUBCUTANEOUS
  Administered 2022-06-21: 3 [IU] via SUBCUTANEOUS

## 2022-06-12 MED ORDER — CILOSTAZOL 50 MG PO TABS: 50.0000 mg | ORAL_TABLET | Freq: Two times a day (BID) | ORAL | Status: DC

## 2022-06-12 MED ORDER — SODIUM CHLORIDE 0.9 % IV SOLN: INTRAVENOUS | Status: DC

## 2022-06-12 MED ORDER — STROKE: EARLY STAGES OF RECOVERY BOOK
Freq: Once | Status: AC
  Administered 2022-06-13: 1
  Filled 2022-06-12: qty 1

## 2022-06-12 MED ORDER — ACETAMINOPHEN 160 MG/5ML PO SOLN: 650.0000 mg | ORAL | Status: DC | PRN

## 2022-06-12 MED ORDER — PANTOPRAZOLE SODIUM 40 MG IV SOLR
40.0000 mg | INTRAVENOUS | Status: DC
  Administered 2022-06-12: 40 mg via INTRAVENOUS
  Filled 2022-06-12: qty 10

## 2022-06-12 MED ORDER — TICAGRELOR 90 MG PO TABS
90.0000 mg | ORAL_TABLET | Freq: Two times a day (BID) | ORAL | Status: DC
  Administered 2022-06-13 – 2022-06-21 (×17): 90 mg via ORAL
  Filled 2022-06-12 (×17): qty 1

## 2022-06-12 MED ORDER — CLOPIDOGREL BISULFATE 75 MG PO TABS: 75.0000 mg | ORAL_TABLET | Freq: Every day | ORAL | Status: DC

## 2022-06-12 MED ORDER — TICAGRELOR 90 MG PO TABS: 90.0000 mg | ORAL_TABLET | Freq: Two times a day (BID) | ORAL | Status: DC

## 2022-06-12 MED ORDER — SENNOSIDES-DOCUSATE SODIUM 8.6-50 MG PO TABS: 1.0000 | ORAL_TABLET | Freq: Every evening | ORAL | Status: DC | PRN

## 2022-06-12 MED ORDER — SODIUM CHLORIDE 0.9 % IV SOLN
3.0000 g | Freq: Four times a day (QID) | INTRAVENOUS | Status: DC
  Administered 2022-06-12 – 2022-06-16 (×15): 3 g via INTRAVENOUS
  Filled 2022-06-12 (×15): qty 8

## 2022-06-12 MED ORDER — ASPIRIN 81 MG PO CHEW
81.0000 mg | CHEWABLE_TABLET | Freq: Every day | ORAL | Status: DC
  Administered 2022-06-12: 81 mg via ORAL
  Filled 2022-06-12: qty 1

## 2022-06-12 MED ORDER — HYDRALAZINE HCL 20 MG/ML IJ SOLN
5.0000 mg | Freq: Four times a day (QID) | INTRAMUSCULAR | Status: DC | PRN
  Administered 2022-06-15: 5 mg via INTRAVENOUS
  Filled 2022-06-12: qty 1

## 2022-06-12 MED ORDER — ACETAMINOPHEN 650 MG RE SUPP: 650.0000 mg | RECTAL | Status: DC | PRN

## 2022-06-12 MED ORDER — ENOXAPARIN SODIUM 40 MG/0.4ML IJ SOSY
40.0000 mg | PREFILLED_SYRINGE | INTRAMUSCULAR | Status: DC
  Administered 2022-06-12 – 2022-06-20 (×9): 40 mg via SUBCUTANEOUS
  Filled 2022-06-12 (×9): qty 0.4

## 2022-06-12 MED ORDER — ASPIRIN 300 MG RE SUPP
300.0000 mg | Freq: Every day | RECTAL | Status: DC
  Administered 2022-06-12 – 2022-06-13 (×2): 300 mg via RECTAL
  Filled 2022-06-12 (×2): qty 1

## 2022-06-12 MED ORDER — ACETAMINOPHEN 325 MG PO TABS
650.0000 mg | ORAL_TABLET | ORAL | Status: DC | PRN
  Administered 2022-06-13 – 2022-06-20 (×11): 650 mg via ORAL
  Filled 2022-06-12 (×11): qty 2

## 2022-06-12 NOTE — Progress Notes (Signed)
This nurse notified by PT that the patients speech had changed in quality and delay this nurse notified MD of  new mental change CT performed with no acute intracranial abnormality and small vessel disease no new orders at this time patient did ask for medication for pain in left arm. New orders received for oxycodone 5 mg PRN Q4 HR for pain. Patient appreciative of care able to speak with this nurse and right sided grip is weak but present.

## 2022-06-12 NOTE — Plan of Care (Signed)
Acute change in condition,unable to complete the rehab program.

## 2022-06-12 NOTE — Progress Notes (Signed)
Occupational Therapy Note  Patient Details  Name: [name] MRN: [id number] Date of Birth: 11-22-1960  Today's Date: 06/12/2022 OT Missed Time: 4 Minutes Missed Time Reason: MD hold (comment)  Patient seen by PA secondary to neurological review of an addition CVA, OT withheld at this time.    Yvonne Kendall 06/12/2022, 2:33 PM

## 2022-06-12 NOTE — H&P (Signed)
History and Physical    [name] [id number] DOB: 1960-10-21 DOA: 06/12/2022  PCP: [name], [name], MD (Confirm with patient/family/NH records and if not entered, this has to be entered at [facility] point of entry) Patient coming from: Inpatient rehab unit I have personally briefly reviewed patient's old medical records in [place]  Chief Complaint: New onset of right-sided weakness, respiratory distress  HPI: [name] is a 62 y.o. female with medical history significant of multiple strokes in 2022, 2023 and 2024, HTN, HLD, IDDM, PVD, non-Hodgkin's lymphoma, sickle cell trait, CKD stage IIIa, was found to have new onset of right-sided weakness and worsening of dysarthria and drooling at [facility] hospital inpatient rehab unit.  Patient was recently hospitalized 2 weeks ago for new onset of right medulla stroke, for which patient was admitted to [facility] telemetry unit and after stabilization patient was discharged to inpatient rehab unit, on discharge patient had residual left-sided weakness, dysarthria.  Before last admission, patient was on cilastatin and Plavix regimen for antiplatelet regimen, on discharge from medical floor, same regimen of Plavix and cilostazol regimen was continued.  This morning, floor staff states the patient has seemingly new weakness of right arm and hand, when patient was unable to hold anything on her right hand as well as worsening of slurred speech as well as new left-sided drooling.  Statin neurology consulted, neurologist outpatient and given that exact onset of her symptoms is unknown, decision made to not give TNK.  Emergency MRI showed quite extension of right medulla stroke crossing midline to the left side.   Review of Systems: Unable to perform, patient has severe dysarthria  Past Medical History:  Diagnosis Date   Abnormality of gait 05/10/2010   BACK PAIN 11/14/2008   Class 1 obesity due to excess calories with body mass index (BMI) of  31.0 to 31.9 in adult 02/07/2021   DIABETES MELLITUS, TYPE II 07/15/2008   Diplopia 07/15/2008   ECZEMA, ATOPIC 04/03/2009   GERD (gastroesophageal reflux disease)    Guillain-Barre ([place]) 1988   HYPERLIPIDEMIA 03/06/2009   HYPERTENSION 07/15/2008   Stroke ([place]) 2010, 2011   x2    Vertebral artery stenosis     Past Surgical History:  Procedure Laterality Date   ABDOMINAL HYSTERECTOMY     DILATION AND CURETTAGE OF UTERUS     FOOT SURGERY     IR ANGIO INTRA EXTRACRAN SEL COM CAROTID INNOMINATE UNI L MOD SED  09/17/2021   IR ANGIO INTRA EXTRACRAN SEL INTERNAL CAROTID UNI R MOD SED  09/17/2021   IR ANGIO VERTEBRAL SEL VERTEBRAL UNI R MOD SED  09/17/2021   IR US GUIDE VASC ACCESS RIGHT  09/17/2021     reports that she has never smoked. She has never used smokeless tobacco. She reports that she does not drink alcohol and does not use drugs.  No Known Allergies  Family History  Problem Relation Age of Onset   Diabetes Sister    Asthma Other    Stroke Other    Hypertension Other    Diabetes Mother    Stroke Mother    Cancer Father        pt states hae had some kind of stomach cancer, ? stomach or colon   Breast cancer Neg Hx      Prior to Admission medications   Medication Sig Start Date End Date Taking? Authorizing Provider  amLODipine (NORVASC) 10 MG tablet Take 1 tablet by mouth once daily 05/09/22   [name], [name],  MD  atorvastatin (LIPITOR) 80 MG tablet Take 1 tablet (80 mg total) by mouth daily. 05/09/22   [name], [name], MD  B Complex Vitamins (VITAMIN B COMPLEX) TABS Take 1 tablet by mouth every morning.    [provider]  Blood Pressure Monitoring (BLOOD PRESSURE MONITOR AUTOMAT) DEVI 1 Device by Does not apply route daily. 08/29/18   [name], [name], MD  Cholecalciferol (VITAMIN D3 PO) Take 1 capsule by mouth every morning.    [provider]  cilostazol (PLETAL) 50 MG tablet Take 1 tablet by mouth twice daily 11/17/21   Lorretta Harp, MD  clopidogrel  (PLAVIX) 75 MG tablet Take 1 tablet (75 mg total) by mouth daily. 05/09/22   [name], [name], MD  Dulaglutide (TRULICITY) [id number] SOPN Inject 0.75 mg into the skin once a week. 04/13/22   Shamleffer, Melanie Crazier, MD  ezetimibe (ZETIA) 10 MG tablet Take 1 tablet (10 mg total) by mouth daily. 05/09/22   [name], [name], MD  FLUoxetine (PROZAC) 20 MG capsule Take 1 capsule by mouth once daily 11/16/21   [name], [name], MD  glucose blood (CONTOUR NEXT TEST) test strip 1 each by Other route 2 (two) times daily. And lancets 2/day 03/09/18   [name], MD  glucose blood [facility] VERIO) test strip USE TO CHECK BLOOD SUGAR TWICE A DAY AND PRN 07/29/15   [name], MD  hydrALAZINE (APRESOLINE) 50 MG tablet TAKE 1 TABLET BY MOUTH THREE TIMES DAILY 11/16/21   [name], [name], MD  insulin glargine-yfgn (SEMGLEE) 100 UNIT/ML injection Inject 0.15 mLs (15 Units total) into the skin 2 (two) times daily. 06/04/22   Mercy Riding, MD  losartan (COZAAR) 50 MG tablet Take 1 tablet (50 mg total) by mouth daily. 06/04/22   Mercy Riding, MD  metoprolol succinate (TOPROL-XL) 25 MG 24 hr tablet Take 1 tablet (25 mg total) by mouth daily. 06/04/22   Mercy Riding, MD  ONE TOUCH LANCETS MISC USE TO CHECK BLOOD SUGAR TWICE A DAY AND PRN 07/29/15   [name], MD  pantoprazole (PROTONIX) 40 MG tablet Take 1 tablet (40 mg total) by mouth daily. 05/09/22   [name], [name], MD    Physical Exam: Vitals:   06/12/22 1647  BP: (!) 156/77  Pulse: 61  Resp: 16  Temp: 98.7 F (37.1 C)  TempSrc: Oral  SpO2: 97%    Constitutional: NAD, calm, comfortable Vitals:   06/12/22 1647  BP: (!) 156/77  Pulse: 61  Resp: 16  Temp: 98.7 F (37.1 C)  TempSrc: Oral  SpO2: 97%   Eyes: PERRL, lids and conjunctivae normal ENMT: Mucous membranes are moist. Posterior pharynx clear of any exudate or lesions.Normal dentition.  Neck: normal, supple, no masses, no thyromegaly Respiratory: clear to auscultation  bilaterally, crackles on bilateral lower fields, increasing breathing effort. No accessory muscle use.  Cardiovascular: Regular rate and rhythm, no murmurs / rubs / gallops. No extremity edema. 2+ pedal pulses. No carotid bruits.  Abdomen: no tenderness, no masses palpated. No hepatosplenomegaly. Bowel sounds positive.  Musculoskeletal: no clubbing / cyanosis. No joint deformity upper and lower extremities. Good ROM, no contractures. Normal muscle tone.  Skin: no rashes, lesions, ulcers. No induration Neurologic: Left-sided facial droop and drooling, muscle strength 3/5 on both arms, sensation appears to be intact on both upper extremities Psychiatric: Awake, following simple commands    Labs on Admission: I have personally reviewed following labs and imaging studies  CBC:  Recent Labs  Lab 06/10/22 1249 06/12/22 1328  WBC 9.3 7.0  NEUTROABS 4.9  --   HGB 11.4* 11.0*  HCT 33.9* 32.9*  MCV 76.7* 78.3*  PLT 281 [id number]   Basic Metabolic Panel: Recent Labs  Lab 06/10/22 1249 06/12/22 1328  NA 137 138  K 4.1 4.1  CL 106 111  CO2 23 21*  GLUCOSE 114* 104*  BUN 28* 24*  CREATININE 1.31* 1.30*  CALCIUM 9.8 9.6   GFR: Estimated Creatinine Clearance: 46.4 mL/min (A) (by C-G formula based on SCr of 1.3 mg/dL (H)). Liver Function Tests: No results for input(s): "AST", "ALT", "ALKPHOS", "BILITOT", "PROT", "ALBUMIN" in the last 168 hours. No results for input(s): "LIPASE", "AMYLASE" in the last 168 hours. No results for input(s): "AMMONIA" in the last 168 hours. Coagulation Profile: No results for input(s): "INR", "PROTIME" in the last 168 hours. Cardiac Enzymes: No results for input(s): "CKTOTAL", "CKMB", "CKMBINDEX", "TROPONINI" in the last 168 hours. BNP (last 3 results) No results for input(s): "PROBNP" in the last 8760 hours. HbA1C: No results for input(s): "HGBA1C" in the last 72 hours. CBG: Recent Labs  Lab 06/11/22 1139 06/11/22 1622 06/11/22 2105 06/12/22 0613  06/12/22 1129  GLUCAP 185* 207* 161* 102* 128*   Lipid Profile: No results for input(s): "CHOL", "HDL", "LDLCALC", "TRIG", "CHOLHDL", "LDLDIRECT" in the last 72 hours. Thyroid Function Tests: No results for input(s): "TSH", "T4TOTAL", "FREET4", "T3FREE", "THYROIDAB" in the last 72 hours. Anemia Panel: No results for input(s): "VITAMINB12", "FOLATE", "FERRITIN", "TIBC", "IRON", "RETICCTPCT" in the last 72 hours. Urine analysis:    Component Value Date/Time   COLORURINE YELLOW 06/10/2022 [place] 06/10/2022 0942   LABSPEC 1.020 06/10/2022 0942   PHURINE 5.5 06/10/2022 0942   GLUCOSEU 100 (A) 06/10/2022 0942   HGBUR TRACE (A) 06/10/2022 0942   HGBUR negative 03/12/2010 0847   BILIRUBINUR NEGATIVE 06/10/2022 0942   BILIRUBINUR n 09/11/2015 1039   KETONESUR NEGATIVE 06/10/2022 0942   PROTEINUR >300 (A) 06/10/2022 0942   UROBILINOGEN 1.0 09/11/2015 1039   UROBILINOGEN 1.0 05/19/2014 1858   NITRITE NEGATIVE 06/10/2022 0942   LEUKOCYTESUR NEGATIVE 06/10/2022 0942    Radiological Exams on Admission: DG Chest 1 View  Result Date: 06/12/2022 CLINICAL DATA:  Pneumonia EXAM: CHEST  1 VIEW COMPARISON:  Chest x-ray 05/29/2022 FINDINGS: Cardiomediastinal silhouette is within normal limits. There is a linear band of atelectasis or scarring in the left mid lung similar to prior. Lungs are otherwise clear. No pleural effusion or pneumothorax. No acute fractures. IMPRESSION: No acute cardiopulmonary process.  No significant interval change. Electronically Signed   By: Ronney Asters M.D.   On: 06/12/2022 16:17   MR BRAIN WO CONTRAST  Result Date: 06/12/2022 CLINICAL DATA:  Neuro deficit, acute, stroke suspected. New right sided weakness, worsening aphasia. EXAM: MRI HEAD WITHOUT CONTRAST TECHNIQUE: Multiplanar, multiecho pulse sequences of the brain and surrounding structures were obtained without intravenous contrast. COMPARISON:  Head CT 06/11/2022 and MRI/MRA 05/30/2022 FINDINGS:  Multiple sequences are up to moderately motion degraded. Brain: The acute infarct in the right ventral medulla on the prior MRI has enlarged and now extends to the left of midline. There is also a new punctate acute infarct in the left cerebellar hemisphere. Small T2 hyperintensities in the cerebral white matter bilaterally are unchanged and nonspecific but compatible with mild chronic small vessel ischemic disease chronic microhemorrhages are again noted in the right thalamus and right parietal white matter. Small chronic bilateral cerebellar infarcts are again noted with  hemosiderin deposition associated with 1 of the left-sided infarcts. The ventricles and sulci are normal. No mass, midline shift, or extra-axial fluid collection is identified. Vascular: Unchanged abnormal appearance of the distal right vertebral artery, more fully evaluated on the prior MRA. Skull and upper cervical spine: No suspicious marrow lesion. Sinuses/Orbits: Unremarkable orbits. Mucous retention cyst in the right maxillary sinus. Small left mastoid effusion. Other: Partially visualized bilateral cervical lymphadenopathy with history of lymphoma. IMPRESSION: 1. Increased size of a recent right medullary infarct which now extends to the left of midline. 2. New punctate acute left cerebellar infarct. 3. Mild chronic small vessel ischemic disease. Chronic cerebellar infarcts. These results will be called to the ordering clinician or representative by the Radiologist Assistant, and communication documented in the PACS or [employer]. Electronically Signed   By: Logan Bores M.D.   On: 06/12/2022 13:24   CT HEAD WO CONTRAST (5MM)  Result Date: 06/11/2022 CLINICAL DATA:  Follow-up examination for stroke, slurred speech. EXAM: CT HEAD WITHOUT CONTRAST TECHNIQUE: Contiguous axial images were obtained from the base of the skull through the vertex without intravenous contrast. RADIATION DOSE REDUCTION: This exam was performed according to  the departmental dose-optimization program which includes automated exposure control, adjustment of the mA and/or kV according to patient size and/or use of iterative reconstruction technique. COMPARISON:  Comparison made with prior CT and MRI from 05/30/2022. FINDINGS: Brain: Cerebral volume within normal limits for age. Mild chronic small vessel ischemic disease noted. Previously identified small medullary infarct not visible by CT. No other acute large vessel territory infarct. No intracranial hemorrhage. No mass lesion or midline shift. No hydrocephalus or extra-axial fluid collection. Vascular: No abnormal hyperdense vessel. Calcified atherosclerosis present about the skull base. Skull: Scalp soft tissues and calvarium within normal limits. Sinuses/Orbits: Globes and orbital soft tissues demonstrate no acute finding. Paranasal sinuses remain largely clear. No mastoid effusion. Other: None. IMPRESSION: 1. No acute intracranial abnormality. 2. Mild chronic small vessel ischemic disease. Previously identified small medullary infarct not visible by CT. Electronically Signed   By: Jeannine Boga M.D.   On: 06/11/2022 21:07    EKG: ordered  Assessment/Plan Principal Problem:   Stroke [facility]) Active Problems:   Acute CVA (cerebrovascular accident) ([place])   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[place])   Dysarthria   Aspiration pneumonia ([place])  (please populate well all problems here in Problem List. (For example, if patient is on BP meds at home and you resume or decide to hold them, it is a problem that needs to be her. Same for CAD, COPD, HLD and so on)  Acute stroke, with new onset of right-sided paresis, and seemingly acute dysphagia with facial droops and drooling -Low creatinine appears to be a extension of the known recent medulla stroke cross midline to the left medulla as well as a new punctuate left cerebellar infarct -Neurology consultation appreciated, who recommends dual platelet ASA plus  Brilinta for 4 weeks -Agree with permissive hypertension, hold off all home BP meds -As needed hydralazine for 200/110 -Given there is a significant change of dysarthria, suspect patient now has dysphagia and possible aspiration as patient also developed respiratory distress.  Keep patient n.p.o. for now, reconsult speech -Long-term prognosis guarded given repeated strokes in last 2 years and 2 successive strokes in 1 month.  Sister at bedside informed.  Probably aspiration pneumonia -Given the significant acute onset of respiratory distress along with drooling and worsening of dysarthria, suspect patient also developed dysphagia secondary to extension of  her stroke -Physical exam showed bilateral lower field crackles and x-ray showed suspicious early infiltrates of right lower field, suspect aspiration pneumonia, will cover with Unasyn -Repeat x-ray in the morning -Check procalcitonin  HTN -Allow permissive hypertension -Hold off home BP meds, start as needed hydralazine  IDDM -Patient is n.p.o., hold off long-acting insulin -Sliding scale for now  PVD -Will hold off cilostazol while patient on DAPT of aspirin and Brilinta  CKD stage IIIa -Euvolemic, creatinine level stable  Chronic HFpEF -Euvolemic, BP/CHF medication on hold, monitor volume status, follow-up chest x-ray in the morning.    DVT prophylaxis: Lovenox Code Status: Full code Family Communication: Sister at bedside Disposition Plan: Patient is sick with recurrent stroke within 1 month now with significant deficit on right side, and possible dysphagia and aspiration pneumonia, expect more than 2 midnight hospital stay Consults called: Neurology Admission status: PCU  Lequita Halt MD [facility] Pager [phone]  06/12/2022, 5:56 PM

## 2022-06-12 NOTE — Progress Notes (Signed)
PROGRESS NOTE   Subjective/Complaints:  Pt evaluated this morning, in bed appearing much weaker than yesterday, more dysarthric and c/o weakness. States her right side started feeling weaker sometime last night which was new--nursing was unaware per charge nurse this morning, no calls to provider overnight regarding worsening symptoms. Head CT last night was negative for acute findings.  She states she feels very tired and weak. She slept alright and denies pain currently.  Otherwise can't give much additional information. Just repeatedly states she's weak and tired.   ROS: +dysarthria-worsening, +R sided weakess-new. +neck pain. +nocturia neg dysuria. Limited due to dysarthria.    Objective:   MR BRAIN WO CONTRAST  Result Date: 06/12/2022 CLINICAL DATA:  Neuro deficit, acute, stroke suspected. New right sided weakness, worsening aphasia. EXAM: MRI HEAD WITHOUT CONTRAST TECHNIQUE: Multiplanar, multiecho pulse sequences of the brain and surrounding structures were obtained without intravenous contrast. COMPARISON:  Head CT 06/11/2022 and MRI/MRA 05/30/2022 FINDINGS: Multiple sequences are up to moderately motion degraded. Brain: The acute infarct in the right ventral medulla on the prior MRI has enlarged and now extends to the left of midline. There is also a new punctate acute infarct in the left cerebellar hemisphere. Small T2 hyperintensities in the cerebral white matter bilaterally are unchanged and nonspecific but compatible with mild chronic small vessel ischemic disease chronic microhemorrhages are again noted in the right thalamus and right parietal white matter. Small chronic bilateral cerebellar infarcts are again noted with hemosiderin deposition associated with 1 of the left-sided infarcts. The ventricles and sulci are normal. No mass, midline shift, or extra-axial fluid collection is identified. Vascular: Unchanged abnormal  appearance of the distal right vertebral artery, more fully evaluated on the prior MRA. Skull and upper cervical spine: No suspicious marrow lesion. Sinuses/Orbits: Unremarkable orbits. Mucous retention cyst in the right maxillary sinus. Small left mastoid effusion. Other: Partially visualized bilateral cervical lymphadenopathy with history of lymphoma. IMPRESSION: 1. Increased size of a recent right medullary infarct which now extends to the left of midline. 2. New punctate acute left cerebellar infarct. 3. Mild chronic small vessel ischemic disease. Chronic cerebellar infarcts. These results will be called to the ordering clinician or representative by the Radiologist Assistant, and communication documented in the PACS or [employer]. Electronically Signed   By: Logan Bores M.D.   On: 06/12/2022 13:24   CT HEAD WO CONTRAST (5MM)  Result Date: 06/11/2022 CLINICAL DATA:  Follow-up examination for stroke, slurred speech. EXAM: CT HEAD WITHOUT CONTRAST TECHNIQUE: Contiguous axial images were obtained from the base of the skull through the vertex without intravenous contrast. RADIATION DOSE REDUCTION: This exam was performed according to the departmental dose-optimization program which includes automated exposure control, adjustment of the mA and/or kV according to patient size and/or use of iterative reconstruction technique. COMPARISON:  Comparison made with prior CT and MRI from 05/30/2022. FINDINGS: Brain: Cerebral volume within normal limits for age. Mild chronic small vessel ischemic disease noted. Previously identified small medullary infarct not visible by CT. No other acute large vessel territory infarct. No intracranial hemorrhage. No mass lesion or midline shift. No hydrocephalus or extra-axial fluid collection. Vascular: No abnormal hyperdense vessel. Calcified atherosclerosis present  about the skull base. Skull: Scalp soft tissues and calvarium within normal limits. Sinuses/Orbits: Globes and  orbital soft tissues demonstrate no acute finding. Paranasal sinuses remain largely clear. No mastoid effusion. Other: None. IMPRESSION: 1. No acute intracranial abnormality. 2. Mild chronic small vessel ischemic disease. Previously identified small medullary infarct not visible by CT. Electronically Signed   By: Jeannine Boga M.D.   On: 06/11/2022 21:07   Recent Labs    06/10/22 1249 06/12/22 1328  WBC 9.3 7.0  HGB 11.4* 11.0*  HCT 33.9* 32.9*  PLT 281 244   Recent Labs    06/10/22 1249 06/12/22 1328  NA 137 138  K 4.1 4.1  CL 106 111  CO2 23 21*  GLUCOSE 114* 104*  BUN 28* 24*  CREATININE 1.31* 1.30*  CALCIUM 9.8 9.6    Intake/Output Summary (Last 24 hours) at 06/12/2022 1436 Last data filed at 06/12/2022 1400 Gross per 24 hour  Intake 1030 ml  Output --  Net 1030 ml        Physical Exam: Vital Signs Blood pressure 138/65, pulse 72, temperature 97.9 F (36.6 C), temperature source Axillary, resp. rate 18, height 5' 4"$  (1.626 m), weight 83.6 kg, SpO2 100 %.   General: No acute distress, but laying in bed appearing much weaker than yesterday Heart: Regular rate and rhythm no rubs murmurs or extra sounds Lungs: Clear to auscultation, breathing unlabored, no rales or wheezes Abdomen: Positive bowel sounds, soft nontender to palpation, nondistended Extremities: No clubbing, cyanosis, or edema MSK:      No apparent deformity.  Now with 1+ hand grip of R hand and otherwise unable to move RUE, LUE more flaccid than yesterday  Neuro: Alert and oriented but more pronounced dysarthria today, CN not fully assessed  Prior exam       Strength: RUE and RLE 5/5                 LUE: 2- shoulder abd, 3-/5 EF, 3-/5 EE, 1/5 WE, 2/5 FF, 1/5 FA - improved Motor exam today as above except 1/5 in SA, EF, EE (may be related to lethargy)                 LLE:  3-/5 HF, 2-/5 KE, 0/5 DF/EHL/ PF  Unchanged except HF trace ( ? Cognition)  Neurologic exam:  Cognition: AAO to person,  place, time and event.  Language: Fluent, No substitutions or neoglisms. + mild dysarthria.        Assessment/Plan: 1. Functional deficits which require 3+ hours per day of interdisciplinary therapy in a comprehensive inpatient rehab setting. Physiatrist is providing close team supervision and 24 hour management of active medical problems listed below. Physiatrist and rehab team continue to assess barriers to discharge/monitor patient progress toward functional and medical goals  Care Tool:  Bathing    Body parts bathed by patient: Left arm, Chest, Abdomen, Front perineal area, Buttocks, Right upper leg, Left upper leg, Face   Body parts bathed by helper: Left lower leg, Right lower leg, Buttocks, Right arm     Bathing assist Assist Level: Maximal Assistance - Patient 24 - 49%     Upper Body Dressing/Undressing Upper body dressing   What is the patient wearing?: Pull over shirt    Upper body assist Assist Level: Maximal Assistance - Patient 25 - 49%    Lower Body Dressing/Undressing Lower body dressing      What is the patient wearing?: Pants, Incontinence brief  Lower body assist Assist for lower body dressing: Total Assistance - Patient < 25%     Toileting Toileting    Toileting assist Assist for toileting: Moderate Assistance - Patient 50 - 74%     Transfers Chair/bed transfer  Transfers assist     Chair/bed transfer assist level: 2 Helpers     Locomotion Ambulation   Ambulation assist   Ambulation activity did not occur: Safety/medical concerns (limited 2/2 LLE deficits)          Walk 10 feet activity   Assist  Walk 10 feet activity did not occur: Safety/medical concerns (limited 2/2 LLE deficits)        Walk 50 feet activity   Assist Walk 50 feet with 2 turns activity did not occur: Safety/medical concerns (limited 2/2 LLE deficits)         Walk 150 feet activity   Assist Walk 150 feet activity did not occur:  Safety/medical concerns (limited 2/2 LLE deficits)         Walk 10 feet on uneven surface  activity   Assist Walk 10 feet on uneven surfaces activity did not occur: Safety/medical concerns (limited 2/2 LLE deficits)         Wheelchair     Assist Is the patient using a wheelchair?: Yes (Per PT Eval) Type of Wheelchair: Manual           Wheelchair 50 feet with 2 turns activity    Assist        Assist Level: Dependent - Patient 0% (Per PT Eval)   Wheelchair 150 feet activity     Assist      Assist Level: Dependent - Patient 0%   Blood pressure 138/65, pulse 72, temperature 97.9 F (36.6 C), temperature source Axillary, resp. rate 18, height 5' 4"$  (1.626 m), weight 83.6 kg, SpO2 100 %.  Medical Problem List and Plan: 1. 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 Roosevelt Locks who accepts patient 2.  Antithrombotics: -DVT/anticoagulation:  Pharmaceutical: Lovenox 46m QD -antiplatelet therapy: Plavix 765mQD, Pletal 5071mID--likely to change per neuro, see their recs, GREATLY APPRECIATE their assistance  3. Pain Management:  Tylenol prn. Oxycodone 5mg106mh PRN ordered overnight 06/10/22 -Mild tenderness Left trap -06/11/22 ordered robaxin 500mg32m PRN d/t pt c/o muscle spasms 4. Mood/Behavior/Sleep:  LCSW to follow for evaluation and support.              -antipsychotic agents: N/A              --trazodone prn for insomnia.  5. Neuropsych/cognition: This patient is capable of making decisions on her own behalf. 6. Skin/Wound Care: Routine pressure relief measures.  7. Fluids/Electrolytes/Nutrition: Monitor I/O. Check CMET in am - stable labs 06/10/22, monitor on weekly labs  8. HTN; Monitor BP TID--avoid hypoperfusion.  --continue hydralazine 50mg 67m amlodipine 10mg Q58mnd metoprolol 25mg QD27msartan 50mg QD 23mordered  currently - Mild HTN; monitor with therapies -06/12/22 BP stable/controlled overall, continue current medications  Vitals:   06/10/22 0646 06/10/22 1530 06/10/22 1936 06/10/22 2211  BP: (!) 158/69 (!) 140/63 137/63 (!) 149/71   06/11/22 0341 06/11/22 0648 06/11/22 1422 06/11/22 1924  BP: 138/65 (!) 145/74 (!) 144/66 (!) 141/62   06/11/22 2058 06/12/22 0411 06/12/22 1216 06/12/22 1335  BP: (!) 142/67 131/62 126/60 138/65      9. T2DM w/peripheral neuroapthy: Hgb A1C- 11.4 (improving >14 11/23) Poorly controlled.              --was on Trulicity weekly, lantus 30 units and novolog 8 units TID ac. --Continue insulin glargine 15 units BID with 5 units meal coverage TID. Titrate as indicated.              --am hypoglycemia noted. Will change SSI to sensitive.    - 2/6 increase meal coverage to 6 units  -Controlled 2/9 -06/12/22 currently on Glargine 12U BID and Novolog 6U TID WC with SSI TID AC+QHS, fair control, cont regimen Recent Labs    06/11/22 2105 06/12/22 0613 06/12/22 1129  GLUCAP 161* 102* 128*     10. Elevated lipids: On Atorvastatin 30m QD and Zetia 141mQD.  11. PVD: On pletal and atorvastatin. 12. H/o depression/anxiety: Continue Prozac 2036mD with xanax 0.9m46mD PRN/hydroxyzine 9mg37m PRN.   -Reports mood is stable, monitor 13. Obesity: BMI-30. Educate on diet and wt loss to help promote health and mobility.  14. CKD: Baseline SCr 1.3-1.4. Avoid nephrotoxic agents.              --note rise in BUN--encourage fluid intake. Monitor with  serial checks.    - 2/5: Cr 1.35/BUN 26, around her baseline, continue to monitor   -06/12/22 Cr 1.3, BUN 24, stable   15. Dyslipidemia: Continue Zetia/Lipitor.  --Refer to cardiology for  PCSK9? (Has seen Dr. 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 Dysarthria/R sided weakness: dysarthria has been reported during her stay, but noted to be worse 06/12/22 AM, pt reporting R sided weakness starting sometime late overnight 06/11/22-06/12/22 -06/11/22 nursing stating that therapy team reported worsening slurred speech compared to prior exams, pt with mild dysarthria during exam but unclear what her baseline is-- she is oriented x4 during exam. Given reports of worsening weakness yesterday and possible worsening speech today, head CT ordered; doubt need for repeat labs since these were done yesterday. Will hold off on neuro consult for now, until head CT order returns. -06/12/22 on eval this morning, pt much weaker now with R sided weakness, worsening dysarthria; spoke with Dr. AroraRory [facility] who recommended MRI brain w/o contrast, obtained and shows Increased size of a recent right medullary infarct which now extends to the left of midline and new L punctate cerebellar infarct -Dr. Arora/neurology team will see pt and make recs, appreciate their assistance, likely pt will need SNF placement at this point, concern that CIR may no longer appropriate -will need to make NPO until swallow study can be done; will start IV NS at 50ml/67mspoke with AllysoElson Areasarlier but doesn't sound like eval can happen today -This afternoon, after repeat eval while neuro NP was present, pt more tachypneic and has slightly labored breathing which is new; recommending admission at this point; consulted [facility] and spoke with Dr. Zhang Roosevelt LocksOTE: patient requested that I speak with her sister BrendaAurelio Brashave me permission to  give sister medical information. I spoke with her and informed her of the  situation, she can be reached at [phone]       LOS: 8 days A FACE TO [place] 06/12/2022, 2:36 PM

## 2022-06-12 NOTE — Consult Note (Signed)
Stroke Neurology Consultation Note  Consult Requested by: Reece Agar, PA-C  Reason for Consult: Right-sided weakness that is new, dyspnea  Consult Date:  06/12/22  The history was obtained from the patient and chart.  During history and examination, all items were  able to obtain unless otherwise noted.  History of Present Illness:  Shawnika Riskin is an 62 y.o. African American female with PMH of diabetes, GBS in 1988, hypertension, several strokes, CKD, non-Hodgkin's lymphoma and questionable history of possible prior GBS who was originally admitted on 1/28 with dizziness diplopia and unusual sensations through her body.  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   This morning, noted to be different from her clinical condition of yesterday with worsening right-sided weakness, difficulty breathing and more slurred speech than baseline. At sometime yesterday, patient unable to describe when, she developed right-sided weakness.  I was initially approached on the phone.  She is not a candidate for TNK and last known well is unclear.  I recommended getting an MRI of the brain Repeat MRI demonstrates extension of known right medullary stroke to now and all the left side.  Patient is noted to have labored respirations with visible accessory muscle use and states that she is having a hard time breathing.  Date last known well: Date: 06/11/2022 Time last known well: Unable to determine tPA Given: No: Recent stroke and outside window MRS:  4 NIHSS:   1a Level of Conscious.: 0 1b LOC Questions: 0 1c LOC Commands: 0 2 Best Gaze: 0 3 Visual: 0 4 Facial Palsy: 0 5a Motor Arm - left: 4 5b Motor Arm - Right: 2 6a Motor Leg - Left: 4 6b Motor Leg - Right: 3 7 Limb Ataxia: 0 8 Sensory: 0 9 Best Language: 0 10 Dysarthria: 1 11 Extinct and Inattention.: 0 TOTAL: 14     Past Medical History:  Diagnosis Date    Abnormality of gait 05/10/2010   BACK PAIN 11/14/2008   Class 1 obesity due to excess calories with body mass index (BMI) of 31.0 to 31.9 in adult 02/07/2021   DIABETES MELLITUS, TYPE II 07/15/2008   Diplopia 07/15/2008   ECZEMA, ATOPIC 04/03/2009   GERD (gastroesophageal reflux disease)    Guillain-Barre ([place]) 1988   HYPERLIPIDEMIA 03/06/2009   HYPERTENSION 07/15/2008   Stroke ([place]) 2010, 2011   x2    Vertebral artery stenosis      Past Surgical History:  Procedure Laterality Date   ABDOMINAL HYSTERECTOMY     DILATION AND CURETTAGE OF UTERUS     FOOT SURGERY     IR ANGIO INTRA EXTRACRAN SEL COM CAROTID INNOMINATE UNI L MOD SED  09/17/2021   IR ANGIO INTRA EXTRACRAN SEL INTERNAL CAROTID UNI R MOD SED  09/17/2021   IR ANGIO VERTEBRAL SEL VERTEBRAL UNI R MOD SED  09/17/2021   IR US GUIDE VASC ACCESS RIGHT  09/17/2021    Family History  Problem Relation Age of Onset   Diabetes Sister    Asthma Other    Stroke Other    Hypertension Other    Diabetes Mother    Stroke Mother    Cancer Father        pt states hae had some kind of stomach cancer, ? stomach or colon   Breast cancer Neg Hx      Social History:  reports that she has never smoked. She has never used smokeless tobacco. She  reports that she does not drink alcohol and does not use drugs.  Review of Systems: A full ROS was attempted today and was  able to be performed.  Systems assessed include - Constitutional, Eyes, HENT, Respiratory, Cardiovascular, Gastrointestinal, Genitourinary, Integument/breast, Hematologic/lymphatic, Musculoskeletal, Neurological, Behavioral/Psych, Endocrine, Allergic/Immunologic - with pertinent responses as per HPI.  Allergies: No Known Allergies   Medications: I have reviewed the patient's current medications. Scheduled:  amLODipine  10 mg Oral Daily   aspirin  81 mg Oral Daily   atorvastatin  80 mg Oral Daily   enoxaparin (LOVENOX) injection  40 mg Subcutaneous Q24H   ezetimibe  10 mg Oral  Daily   FLUoxetine  20 mg Oral Daily   hydrALAZINE  50 mg Oral Q8H   insulin aspart  0-5 Units Subcutaneous QHS   insulin aspart  0-9 Units Subcutaneous TID WC   insulin aspart  6 Units Subcutaneous TID WC   insulin glargine-yfgn  12 Units Subcutaneous BID   metoprolol succinate  25 mg Oral Daily   [START ON 06/13/2022] pantoprazole (PROTONIX) IV  40 mg Intravenous Daily   ticagrelor  90 mg Oral BID   Continuous:  sodium chloride     [id number], ALPRAZolam, alum & mag hydroxide-simeth, bisacodyl, diphenhydrAMINE, Gerhardt's butt cream, guaiFENesin-dextromethorphan, hydrOXYzine, methocarbamol, mouth rinse, oxyCODONE, polyethylene glycol, prochlorperazine **OR** prochlorperazine **OR** prochlorperazine, sodium phosphate, traZODone  Test Results: CBC:  Recent Labs  Lab 06/10/22 1249 06/12/22 1328  WBC 9.3 7.0  NEUTROABS 4.9  --   HGB 11.4* 11.0*  HCT 33.9* 32.9*  MCV 76.7* 78.3*  PLT 281 [id number]   Basic Metabolic Panel:  Recent Labs  Lab 06/10/22 1249 06/12/22 1328  NA 137 138  K 4.1 4.1  CL 106 111  CO2 23 21*  GLUCOSE 114* 104*  BUN 28* 24*  CREATININE 1.31* 1.30*  CALCIUM 9.8 9.6   CBG:  Recent Labs  Lab 06/11/22 1139 06/11/22 1622 06/11/22 2105 06/12/22 0613 06/12/22 1129  GLUCAP 185* 207* 161* 102* 128*   Urinalysis:  Recent Labs  Lab 06/10/22 0942  COLORURINE YELLOW  LABSPEC 1.020  PHURINE 5.5  GLUCOSEU 100*  HGBUR TRACE*  BILIRUBINUR NEGATIVE  KETONESUR NEGATIVE  PROTEINUR >300*  NITRITE NEGATIVE  LEUKOCYTESUR NEGATIVE   Microbiology:   Lipid Panel:     Component Value Date/Time   CHOL 181 05/30/2022 0541   TRIG 103 05/30/2022 0541   TRIG 72 04/28/2006 0951   HDL 37 (L) 05/30/2022 0541   CHOLHDL 4.9 05/30/2022 0541   VLDL 21 05/30/2022 0541   LDLCALC 123 (H) 05/30/2022 0541   HgbA1c:  Lab Results  Component Value Date   HGBA1C 11.4 (H) 05/30/2022   Urine Drug Screen:     Component Value Date/Time   LABOPIA NONE DETECTED  09/16/2021 1706   COCAINSCRNUR NONE DETECTED 09/16/2021 1706   LABBENZ NONE DETECTED 09/16/2021 1706   AMPHETMU NONE DETECTED 09/16/2021 1706   THCU NONE DETECTED 09/16/2021 1706   LABBARB NONE DETECTED 09/16/2021 1706    Alcohol Level: No results for input(s): "ETH" in the last 168 hours.  MR BRAIN WO CONTRAST  Result Date: 06/12/2022 CLINICAL DATA:  Neuro deficit, acute, stroke suspected. New right sided weakness, worsening aphasia. EXAM: MRI HEAD WITHOUT CONTRAST TECHNIQUE: Multiplanar, multiecho pulse sequences of the brain and surrounding structures were obtained without intravenous contrast. COMPARISON:  Head CT 06/11/2022 and MRI/MRA 05/30/2022 FINDINGS: Multiple sequences are up to moderately motion degraded. Brain: The acute infarct in the right ventral medulla on the  prior MRI has enlarged and now extends to the left of midline. There is also a new punctate acute infarct in the left cerebellar hemisphere. Small T2 hyperintensities in the cerebral white matter bilaterally are unchanged and nonspecific but compatible with mild chronic small vessel ischemic disease chronic microhemorrhages are again noted in the right thalamus and right parietal white matter. Small chronic bilateral cerebellar infarcts are again noted with hemosiderin deposition associated with 1 of the left-sided infarcts. The ventricles and sulci are normal. No mass, midline shift, or extra-axial fluid collection is identified. Vascular: Unchanged abnormal appearance of the distal right vertebral artery, more fully evaluated on the prior MRA. Skull and upper cervical spine: No suspicious marrow lesion. Sinuses/Orbits: Unremarkable orbits. Mucous retention cyst in the right maxillary sinus. Small left mastoid effusion. Other: Partially visualized bilateral cervical lymphadenopathy with history of lymphoma. IMPRESSION: 1. Increased size of a recent right medullary infarct which now extends to the left of midline. 2. New punctate  acute left cerebellar infarct. 3. Mild chronic small vessel ischemic disease. Chronic cerebellar infarcts. These results will be called to the ordering clinician or representative by the Radiologist Assistant, and communication documented in the PACS or [employer]. Electronically Signed   By: Logan Bores M.D.   On: 06/12/2022 13:24   CT HEAD WO CONTRAST (5MM)  Result Date: 06/11/2022 CLINICAL DATA:  Follow-up examination for stroke, slurred speech. EXAM: CT HEAD WITHOUT CONTRAST TECHNIQUE: Contiguous axial images were obtained from the base of the skull through the vertex without intravenous contrast. RADIATION DOSE REDUCTION: This exam was performed according to the departmental dose-optimization program which includes automated exposure control, adjustment of the mA and/or kV according to patient size and/or use of iterative reconstruction technique. COMPARISON:  Comparison made with prior CT and MRI from 05/30/2022. FINDINGS: Brain: Cerebral volume within normal limits for age. Mild chronic small vessel ischemic disease noted. Previously identified small medullary infarct not visible by CT. No other acute large vessel territory infarct. No intracranial hemorrhage. No mass lesion or midline shift. No hydrocephalus or extra-axial fluid collection. Vascular: No abnormal hyperdense vessel. Calcified atherosclerosis present about the skull base. Skull: Scalp soft tissues and calvarium within normal limits. Sinuses/Orbits: Globes and orbital soft tissues demonstrate no acute finding. Paranasal sinuses remain largely clear. No mastoid effusion. Other: None. IMPRESSION: 1. No acute intracranial abnormality. 2. Mild chronic small vessel ischemic disease. Previously identified small medullary infarct not visible by CT. Electronically Signed   By: Jeannine Boga M.D.   On: 06/11/2022 21:07   ECHOCARDIOGRAM COMPLETE  Result Date: 05/31/2022    ECHOCARDIOGRAM REPORT   Patient Name:   LYNZY AYLSWORTH Date  of Exam: 05/31/2022 Medical Rec #:  [id number]       Height:       64.0 in Accession #:    [id number]      Weight:       179.0 lb Date of Birth:  January 28, 1961       BSA:          1.866 m Patient Age:    16 years        BP:           168/93 mmHg Patient Gender: F               HR:           72 bpm. Exam Location:  Inpatient Procedure: 2D Echo, Cardiac Doppler and Color Doppler Indications:    Stroke  History:  Patient has prior history of Echocardiogram examinations.                 Stroke; Risk Factors:Hypertension, Diabetes and Dyslipidemia.  Sonographer:    Phineas Douglas Referring Phys: [phone] AMRIT ADHIKARI IMPRESSIONS  1. Left ventricular ejection fraction, by estimation, is 60 to 65%. The left ventricle has normal function. The left ventricle has no regional wall motion abnormalities. There is mild concentric left ventricular hypertrophy. Left ventricular diastolic parameters are consistent with Grade I diastolic dysfunction (impaired relaxation).  2. Right ventricular systolic function is normal. The right ventricular size is normal. Tricuspid regurgitation signal is inadequate for assessing PA pressure.  3. The mitral valve is grossly normal. No evidence of mitral valve regurgitation. No evidence of mitral stenosis.  4. The aortic valve is tricuspid. Aortic valve regurgitation is not visualized. No aortic stenosis is present.  5. The inferior vena cava is normal in size with greater than 50% respiratory variability, suggesting right atrial pressure of 3 mmHg. Conclusion(s)/Recommendation(s): No intracardiac source of embolism detected on this transthoracic study. Consider a transesophageal echocardiogram to exclude cardiac source of embolism if clinically indicated. FINDINGS  Left Ventricle: Left ventricular ejection fraction, by estimation, is 60 to 65%. The left ventricle has normal function. The left ventricle has no regional wall motion abnormalities. The left ventricular internal cavity size was  normal in size. There is  mild concentric left ventricular hypertrophy. Left ventricular diastolic parameters are consistent with Grade I diastolic dysfunction (impaired relaxation). Right Ventricle: The right ventricular size is normal. No increase in right ventricular wall thickness. Right ventricular systolic function is normal. Tricuspid regurgitation signal is inadequate for assessing PA pressure. Left Atrium: Left atrial size was normal in size. Right Atrium: Right atrial size was normal in size. Pericardium: Trivial pericardial effusion is present. Mitral Valve: The mitral valve is grossly normal. No evidence of mitral valve regurgitation. No evidence of mitral valve stenosis. Tricuspid Valve: The tricuspid valve is grossly normal. Tricuspid valve regurgitation is trivial. No evidence of tricuspid stenosis. Aortic Valve: The aortic valve is tricuspid. Aortic valve regurgitation is not visualized. No aortic stenosis is present. Pulmonic Valve: The pulmonic valve was grossly normal. Pulmonic valve regurgitation is trivial. No evidence of pulmonic stenosis. Aorta: The aortic root and ascending aorta are structurally normal, with no evidence of dilitation. Venous: The inferior vena cava is normal in size with greater than 50% respiratory variability, suggesting right atrial pressure of 3 mmHg. IAS/Shunts: The atrial septum is grossly normal.  LEFT VENTRICLE PLAX 2D LVIDd:         3.60 cm      Diastology LVIDs:         2.30 cm      LV e' medial:    5.68 cm/s LV PW:         1.30 cm      LV E/e' medial:  12.4 LV IVS:        1.20 cm      LV e' lateral:   7.22 cm/s LVOT diam:     2.00 cm      LV E/e' lateral: 9.8 LV SV:         69 LV SV Index:   37 LVOT Area:     3.14 cm  LV Volumes (MOD) LV vol d, MOD A2C: 119.0 ml LV vol d, MOD A4C: 111.0 ml LV vol s, MOD A2C: 44.0 ml LV vol s, MOD A4C: 48.0 ml LV SV MOD A2C:  75.0 ml LV SV MOD A4C:     111.0 ml LV SV MOD BP:      68.6 ml RIGHT VENTRICLE             IVC RV Basal  diam:  3.10 cm     IVC diam: 1.40 cm RV S prime:     13.90 cm/s TAPSE (M-mode): 1.8 cm LEFT ATRIUM             Index        RIGHT ATRIUM           Index LA diam:        3.70 cm 1.98 cm/m   RA Area:     12.00 cm LA Vol (A2C):   51.4 ml 27.54 ml/m  RA Volume:   23.00 ml  12.32 ml/m LA Vol (A4C):   56.6 ml 30.33 ml/m LA Biplane Vol: 58.1 ml 31.13 ml/m  AORTIC VALVE LVOT Vmax:   93.20 cm/s LVOT Vmean:  62.800 cm/s LVOT VTI:    0.220 m  AORTA Ao Root diam: 3.10 cm Ao Asc diam:  3.20 cm MITRAL VALVE MV Area (PHT): 2.77 cm    SHUNTS MV Decel Time: 274 msec    Systemic VTI:  0.22 m MV E velocity: 70.70 cm/s  Systemic Diam: 2.00 cm MV A velocity: 95.50 cm/s MV E/A ratio:  0.74 Eleonore Chiquito MD Electronically signed by Eleonore Chiquito MD Signature Date/Time: 05/31/2022/9:44:38 AM    Final    CT HEAD WO CONTRAST (5MM)  Result Date: 05/30/2022 CLINICAL DATA:  Follow-up stroke EXAM: CT HEAD WITHOUT CONTRAST TECHNIQUE: Contiguous axial images were obtained from the base of the skull through the vertex without intravenous contrast. RADIATION DOSE REDUCTION: This exam was performed according to the departmental dose-optimization program which includes automated exposure control, adjustment of the mA and/or kV according to patient size and/or use of iterative reconstruction technique. COMPARISON:  MRI brain dated 05/30/2022 at 1031 hours FINDINGS: Brain: No evidence of acute infarction, hemorrhage, hydrocephalus, extra-axial collection or mass lesion/mass effect. Known tiny acute infarct within the right medulla is not evident on current CT. Mild subcortical white matter and periventricular small vessel ischemic changes. Vascular: Intracranial atherosclerosis. Skull: Normal. Negative for fracture or focal lesion. Sinuses/Orbits: The visualized paranasal sinuses are essentially clear. The mastoid air cells are unopacified. Other: None. IMPRESSION: Known tiny acute infarct within the right medulla is not evident on current CT.  Mild small vessel ischemic changes. Electronically Signed   By: Julian Hy M.D.   On: 05/30/2022 23:55   MR ANGIO HEAD WO CONTRAST  Result Date: 05/30/2022 CLINICAL DATA:  History of non-Hodgkin's lymphoma, hypertension, Guillain-Barre syndrome, and multiple strokes. Complains of dizziness and overall weakness. Possible CVA. EXAM: MRI HEAD WITHOUT CONTRAST MRA HEAD WITHOUT CONTRAST MRA NECK WITHOUT AND WITH CONTRAST TECHNIQUE: Multiplanar, multi-echo pulse sequences of the brain and surrounding structures were acquired without intravenous contrast. Angiographic images of the Circle of Willis were acquired using MRA technique without intravenous contrast. Angiographic images of the neck were acquired using MRA technique without and with intravenous contrast. Carotid stenosis measurements (when applicable) are obtained utilizing NASCET criteria, using the distal internal carotid diameter as the denominator. CONTRAST:  53m GADAVIST GADOBUTROL 1 MMOL/ML IV SOLN COMPARISON:  None Available. CT head 1 day prior, CTA head/neck 09/15/2021 FINDINGS: MRI HEAD FINDINGS Brain: There is diffusion restriction in the right aspect of the medulla with faint associated FLAIR signal abnormality consistent with acute infarct (5-9). There is  no associated hemorrhage or mass effect. There is no other evidence of acute infarct. There is no acute intracranial hemorrhage or extra-axial fluid collection. Parenchymal volume is normal. The ventricles are normal in size. Scattered foci of FLAIR signal abnormality in the supratentorial white matter are nonspecific but likely reflects sequela of underlying chronic small-vessel ischemic change, similar to the MRI from 2023. There are small remote infarcts in the bilateral cerebellar hemispheres, at least 1 of which in the left cerebellar hemisphere is new since 09/15/2021. There are punctate chronic microhemorrhages in the right thalamus and right parietal white matter, nonspecific but  possibly hypertensive. The pituitary and suprasellar region are normal. There is no mass lesion. There is no mass effect or midline shift. Vascular: See below. Skull and upper cervical spine: Normal marrow signal. Sinuses/Orbits: The paranasal sinuses are clear. The globes and orbits are unremarkable. Other: There are prominent lymph nodes in the imaged upper neck on the coronal diffusion sequence which appear overall decreased in size since the prior MRI from 2023 though are incompletely evaluated. MRA HEAD FINDINGS Anterior circulation: There is atherosclerotic irregularity of the carotid siphons resulting in moderate stenosis of the cavernous segment on the left and mild-to-moderate stenosis of the supraclinoid segment on the right. The left M1 segment is patent. There is moderate to severe stenosis of the origin of the superior left M2 division (7-156). The distal left MCA branches appear patent. There is severe stenosis of the distal right M1 segment (1036-3). There is multifocal moderate narrowing of the proximal right superior M2 division. The distal right MCA branches otherwise appear patent. The bilateral ACAs are patent, without proximal stenosis or occlusion. The anterior communicating artery is not definitely identified. There is no aneurysm or AVM. Posterior circulation: There is no flow related enhancement of the right V4 segment on the time-of-flight MRA head. There is intermittent enhancement on the postcontrast MRA neck images. Findings are similar to the CTA from 2023. The PICA origin is not seen on the right. There is multifocal severe stenosis/near occlusion of the left V4 segment, also similar to the prior CT. PICA is identified on the left. The basilar artery is patent with atherosclerotic irregularity and narrowing. There is severe stenosis of the origin of the left P1 segment. There is multifocal atherosclerotic irregularity and narrowing in the remainder of the left PCA without other  high-grade stenosis or occlusion. The right PCA is patent with mild atherosclerotic irregularity but no high-grade stenosis or occlusion. Findings are similar to the prior CTA. There is no aneurysm or AVM. Anatomic variants: None. MRA NECK FINDINGS Aortic arch: The imaged aortic arch is normal. The origins of the major branch vessels are patent. The subclavian arteries are patent to the level imaged. 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 Right carotid system: The right common, internal, and external carotid arteries are patent, with mild irregularity at the bifurcation but no hemodynamically significant stenosis or occlusion. There is no evidence of dissection or aneurysm. Left carotid system: The left common, internal, and external carotid arteries are patent, without significant stenosis or occlusion. There is no evidence of dissection or aneurysm. Vertebral arteries: The right vertebral artery appears patent at its origin. There is short-segment high-grade stenosis/occlusion of the distal V2 segment (15-60). There is diminutive reconstitution with further occlusion of the V3 segment. There is moderate stenosis of the left vertebral artery origin (15-52). The left vertebral artery is otherwise patent in the neck without other hemodynamically significant stenosis or  occlusion. There is no evidence of dissection or aneurysm. There is antegrade flow in both vertebral arteries. Other: None IMPRESSION: MR HEAD 1. Small acute infarct in the right aspect of the medulla without hemorrhage or mass effect. 2. Background chronic small-vessel ischemic change with small remote infarcts in the bilateral cerebellar hemispheres, one of which on the left is new since 09/15/2021. 3. Prominent lymph nodes in the upper neck consistent with the history of lymphoma, overall decreased in size since the prior studies from 09/15/2021 but incompletely evaluated. MRA HEAD 1. Diminutive enhancement of the right V4  segment with intermittent occlusion, and multifocal severe stenosis/near occlusion of the left V4 segment, similar to the prior CTA. 2. Additional intracranial atherosclerotic disease as detailed above resulting in moderate stenosis of the left cavernous ICA, mild-to-moderate stenosis of the right supraclinoid ICA, severe stenosis of the origin of the left superior M2 division, severe stenosis of the distal right M1 segment, and severe stenosis of the proximal left P1 segment. MRA NECK 1. 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 2. Moderate stenosis of the origin of the left vertebral artery, not significantly changed. 3. Patent carotid systems bilaterally. Electronically Signed   By: Valetta Mole M.D.   On: 05/30/2022 12:01   MR BRAIN WO CONTRAST  Result Date: 05/30/2022 CLINICAL DATA:  History of non-Hodgkin's lymphoma, hypertension, Guillain-Barre syndrome, and multiple strokes. Complains of dizziness and overall weakness. Possible CVA. EXAM: MRI HEAD WITHOUT CONTRAST MRA HEAD WITHOUT CONTRAST MRA NECK WITHOUT AND WITH CONTRAST TECHNIQUE: Multiplanar, multi-echo pulse sequences of the brain and surrounding structures were acquired without intravenous contrast. Angiographic images of the Circle of Willis were acquired using MRA technique without intravenous contrast. Angiographic images of the neck were acquired using MRA technique without and with intravenous contrast. Carotid stenosis measurements (when applicable) are obtained utilizing NASCET criteria, using the distal internal carotid diameter as the denominator. CONTRAST:  44m GADAVIST GADOBUTROL 1 MMOL/ML IV SOLN COMPARISON:  None Available. CT head 1 day prior, CTA head/neck 09/15/2021 FINDINGS: MRI HEAD FINDINGS Brain: There is diffusion restriction in the right aspect of the medulla with faint associated FLAIR signal abnormality consistent with acute infarct (5-9). There is no associated hemorrhage or mass  effect. There is no other evidence of acute infarct. There is no acute intracranial hemorrhage or extra-axial fluid collection. Parenchymal volume is normal. The ventricles are normal in size. Scattered foci of FLAIR signal abnormality in the supratentorial white matter are nonspecific but likely reflects sequela of underlying chronic small-vessel ischemic change, similar to the MRI from 2023. There are small remote infarcts in the bilateral cerebellar hemispheres, at least 1 of which in the left cerebellar hemisphere is new since 09/15/2021. There are punctate chronic microhemorrhages in the right thalamus and right parietal white matter, nonspecific but possibly hypertensive. The pituitary and suprasellar region are normal. There is no mass lesion. There is no mass effect or midline shift. Vascular: See below. Skull and upper cervical spine: Normal marrow signal. Sinuses/Orbits: The paranasal sinuses are clear. The globes and orbits are unremarkable. Other: There are prominent lymph nodes in the imaged upper neck on the coronal diffusion sequence which appear overall decreased in size since the prior MRI from 2023 though are incompletely evaluated. MRA HEAD FINDINGS Anterior circulation: There is atherosclerotic irregularity of the carotid siphons resulting in moderate stenosis of the cavernous segment on the left and mild-to-moderate stenosis of the supraclinoid segment on the right. The left M1  segment is patent. There is moderate to severe stenosis of the origin of the superior left M2 division (7-156). The distal left MCA branches appear patent. There is severe stenosis of the distal right M1 segment (1036-3). There is multifocal moderate narrowing of the proximal right superior M2 division. The distal right MCA branches otherwise appear patent. The bilateral ACAs are patent, without proximal stenosis or occlusion. The anterior communicating artery is not definitely identified. There is no aneurysm or AVM.  Posterior circulation: There is no flow related enhancement of the right V4 segment on the time-of-flight MRA head. There is intermittent enhancement on the postcontrast MRA neck images. Findings are similar to the CTA from 2023. The PICA origin is not seen on the right. There is multifocal severe stenosis/near occlusion of the left V4 segment, also similar to the prior CT. PICA is identified on the left. The basilar artery is patent with atherosclerotic irregularity and narrowing. There is severe stenosis of the origin of the left P1 segment. There is multifocal atherosclerotic irregularity and narrowing in the remainder of the left PCA without other high-grade stenosis or occlusion. The right PCA is patent with mild atherosclerotic irregularity but no high-grade stenosis or occlusion. Findings are similar to the prior CTA. There is no aneurysm or AVM. Anatomic variants: None. MRA NECK FINDINGS Aortic arch: The imaged aortic arch is normal. The origins of the major branch vessels are patent. The subclavian arteries are patent to the level imaged. 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 Right carotid system: The right common, internal, and external carotid arteries are patent, with mild irregularity at the bifurcation but no hemodynamically significant stenosis or occlusion. There is no evidence of dissection or aneurysm. Left carotid system: The left common, internal, and external carotid arteries are patent, without significant stenosis or occlusion. There is no evidence of dissection or aneurysm. Vertebral arteries: The right vertebral artery appears patent at its origin. There is short-segment high-grade stenosis/occlusion of the distal V2 segment (15-60). There is diminutive reconstitution with further occlusion of the V3 segment. There is moderate stenosis of the left vertebral artery origin (15-52). The left vertebral artery is otherwise patent in the neck without other hemodynamically  significant stenosis or occlusion. There is no evidence of dissection or aneurysm. There is antegrade flow in both vertebral arteries. Other: None IMPRESSION: MR HEAD 1. Small acute infarct in the right aspect of the medulla without hemorrhage or mass effect. 2. Background chronic small-vessel ischemic change with small remote infarcts in the bilateral cerebellar hemispheres, one of which on the left is new since 09/15/2021. 3. Prominent lymph nodes in the upper neck consistent with the history of lymphoma, overall decreased in size since the prior studies from 09/15/2021 but incompletely evaluated. MRA HEAD 1. Diminutive enhancement of the right V4 segment with intermittent occlusion, and multifocal severe stenosis/near occlusion of the left V4 segment, similar to the prior CTA. 2. Additional intracranial atherosclerotic disease as detailed above resulting in moderate stenosis of the left cavernous ICA, mild-to-moderate stenosis of the right supraclinoid ICA, severe stenosis of the origin of the left superior M2 division, severe stenosis of the distal right M1 segment, and severe stenosis of the proximal left P1 segment. MRA NECK 1. 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 2. Moderate stenosis of the origin of the left vertebral artery, not significantly changed. 3. Patent carotid systems bilaterally. Electronically Signed   By: Court Joy.D.  On: 05/30/2022 12:01   MR ANGIO NECK W WO CONTRAST  Result Date: 05/30/2022 CLINICAL DATA:  History of non-Hodgkin's lymphoma, hypertension, Guillain-Barre syndrome, and multiple strokes. Complains of dizziness and overall weakness. Possible CVA. EXAM: MRI HEAD WITHOUT CONTRAST MRA HEAD WITHOUT CONTRAST MRA NECK WITHOUT AND WITH CONTRAST TECHNIQUE: Multiplanar, multi-echo pulse sequences of the brain and surrounding structures were acquired without intravenous contrast. Angiographic images of the Circle of Willis were  acquired using MRA technique without intravenous contrast. Angiographic images of the neck were acquired using MRA technique without and with intravenous contrast. Carotid stenosis measurements (when applicable) are obtained utilizing NASCET criteria, using the distal internal carotid diameter as the denominator. CONTRAST:  41m GADAVIST GADOBUTROL 1 MMOL/ML IV SOLN COMPARISON:  None Available. CT head 1 day prior, CTA head/neck 09/15/2021 FINDINGS: MRI HEAD FINDINGS Brain: There is diffusion restriction in the right aspect of the medulla with faint associated FLAIR signal abnormality consistent with acute infarct (5-9). There is no associated hemorrhage or mass effect. There is no other evidence of acute infarct. There is no acute intracranial hemorrhage or extra-axial fluid collection. Parenchymal volume is normal. The ventricles are normal in size. Scattered foci of FLAIR signal abnormality in the supratentorial white matter are nonspecific but likely reflects sequela of underlying chronic small-vessel ischemic change, similar to the MRI from 2023. There are small remote infarcts in the bilateral cerebellar hemispheres, at least 1 of which in the left cerebellar hemisphere is new since 09/15/2021. There are punctate chronic microhemorrhages in the right thalamus and right parietal white matter, nonspecific but possibly hypertensive. The pituitary and suprasellar region are normal. There is no mass lesion. There is no mass effect or midline shift. Vascular: See below. Skull and upper cervical spine: Normal marrow signal. Sinuses/Orbits: The paranasal sinuses are clear. The globes and orbits are unremarkable. Other: There are prominent lymph nodes in the imaged upper neck on the coronal diffusion sequence which appear overall decreased in size since the prior MRI from 2023 though are incompletely evaluated. MRA HEAD FINDINGS Anterior circulation: There is atherosclerotic irregularity of the carotid siphons resulting  in moderate stenosis of the cavernous segment on the left and mild-to-moderate stenosis of the supraclinoid segment on the right. The left M1 segment is patent. There is moderate to severe stenosis of the origin of the superior left M2 division (7-156). The distal left MCA branches appear patent. There is severe stenosis of the distal right M1 segment (1036-3). There is multifocal moderate narrowing of the proximal right superior M2 division. The distal right MCA branches otherwise appear patent. The bilateral ACAs are patent, without proximal stenosis or occlusion. The anterior communicating artery is not definitely identified. There is no aneurysm or AVM. Posterior circulation: There is no flow related enhancement of the right V4 segment on the time-of-flight MRA head. There is intermittent enhancement on the postcontrast MRA neck images. Findings are similar to the CTA from 2023. The PICA origin is not seen on the right. There is multifocal severe stenosis/near occlusion of the left V4 segment, also similar to the prior CT. PICA is identified on the left. The basilar artery is patent with atherosclerotic irregularity and narrowing. There is severe stenosis of the origin of the left P1 segment. There is multifocal atherosclerotic irregularity and narrowing in the remainder of the left PCA without other high-grade stenosis or occlusion. The right PCA is patent with mild atherosclerotic irregularity but no high-grade stenosis or occlusion. Findings are similar to the prior CTA. There is  no aneurysm or AVM. Anatomic variants: None. MRA NECK FINDINGS Aortic arch: The imaged aortic arch is normal. The origins of the major branch vessels are patent. The subclavian arteries are patent to the level imaged. 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 Right carotid system: The right common, internal, and external carotid arteries are patent, with mild irregularity at the bifurcation but no  hemodynamically significant stenosis or occlusion. There is no evidence of dissection or aneurysm. Left carotid system: The left common, internal, and external carotid arteries are patent, without significant stenosis or occlusion. There is no evidence of dissection or aneurysm. Vertebral arteries: The right vertebral artery appears patent at its origin. There is short-segment high-grade stenosis/occlusion of the distal V2 segment (15-60). There is diminutive reconstitution with further occlusion of the V3 segment. There is moderate stenosis of the left vertebral artery origin (15-52). The left vertebral artery is otherwise patent in the neck without other hemodynamically significant stenosis or occlusion. There is no evidence of dissection or aneurysm. There is antegrade flow in both vertebral arteries. Other: None IMPRESSION: MR HEAD 1. Small acute infarct in the right aspect of the medulla without hemorrhage or mass effect. 2. Background chronic small-vessel ischemic change with small remote infarcts in the bilateral cerebellar hemispheres, one of which on the left is new since 09/15/2021. 3. Prominent lymph nodes in the upper neck consistent with the history of lymphoma, overall decreased in size since the prior studies from 09/15/2021 but incompletely evaluated. MRA HEAD 1. Diminutive enhancement of the right V4 segment with intermittent occlusion, and multifocal severe stenosis/near occlusion of the left V4 segment, similar to the prior CTA. 2. Additional intracranial atherosclerotic disease as detailed above resulting in moderate stenosis of the left cavernous ICA, mild-to-moderate stenosis of the right supraclinoid ICA, severe stenosis of the origin of the left superior M2 division, severe stenosis of the distal right M1 segment, and severe stenosis of the proximal left P1 segment. MRA NECK 1. 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 2. Moderate stenosis of the  origin of the left vertebral artery, not significantly changed. 3. Patent carotid systems bilaterally. Electronically Signed   By: Valetta Mole M.D.   On: 05/30/2022 12:01   CT Angio Chest/Abd/Pel for Dissection W and/or W/WO  Result Date: 05/29/2022 CLINICAL DATA:  numbness, tingling ,dizziness, hypertensive Radiologic records indicates history of CLL. EXAM: CT ANGIOGRAPHY CHEST, ABDOMEN AND PELVIS TECHNIQUE: Non-contrast CT of the chest was initially obtained. Multidetector CT imaging through the chest, abdomen and pelvis was performed using the standard protocol during bolus administration of intravenous contrast. Multiplanar reconstructed images and MIPs were obtained and reviewed to evaluate the vascular anatomy. RADIATION DOSE REDUCTION: This exam was performed according to the departmental dose-optimization program which includes automated exposure control, adjustment of the mA and/or kV according to patient size and/or use of iterative reconstruction technique. CONTRAST:  96m OMNIPAQUE IOHEXOL 350 MG/ML SOLN COMPARISON:  Chest radiograph earlier today. Chest, abdomen, pelvis CT 10/15/2019 FINDINGS: CTA CHEST FINDINGS Cardiovascular: No aortic hematoma on this unenhanced exam. Thoracic aorta is normal in caliber with mild tortuosity. Mild aortic atherosclerosis. No dissection, aneurysm or acute aortic findings. Aberrant right subclavian artery. No central pulmonary embolus on this exam not tailored to pulmonary artery assessment. Borderline cardiomegaly. Minimal pericardial effusion. Mediastinum/Nodes: No enlarged mediastinal or hilar lymph nodes. There is bulky bilateral axillary adenopathy. Index right axillary node measures 2.1 cm short axis series 4, image 21. Left  axillary node measures 2.5 cm short axis series 4, image 33. 11 mm left supraclavicular node is stable. Similar appearance of heterogeneous right lobe of the thyroid. No esophageal wall thickening. Lungs/Pleura: Minor atelectasis in the  lingula. No other focal airspace disease. No pleural fluid. No features of pulmonary edema. No pulmonary mass or nodule. The trachea and central airways are patent. Musculoskeletal: Heterogeneous appearance of the osseous structures but no discrete focal lesion. No acute osseous findings. Review of the MIP images confirms the above findings. CTA ABDOMEN AND PELVIS FINDINGS VASCULAR Aorta: Moderate atherosclerosis. Normal in caliber without aneurysm, dissection, vasculitis or significant stenosis. Celiac: Mild plaque at the origin with minimal stenosis. Branch vessels are patent. No dissection or acute findings. SMA: Patent without evidence of aneurysm, dissection, vasculitis or significant stenosis. Renals: Single bilateral renal arteries. Mild calcified plaque at the origin of the both renal arteries with minimal stenosis. No dissection or acute findings. IMA: Patent without evidence of aneurysm, dissection, vasculitis or significant stenosis. Inflow: Moderate atherosclerosis. No aneurysm, dissection or acute findings. Veins: No obvious venous abnormality within the limitations of this arterial phase study. Review of the MIP images confirms the above findings. NON-VASCULAR Hepatobiliary: No focal hepatic abnormality on this arterial phase exam. Or gallbladder Pancreas: No ductal dilatation or inflammation. Spleen: Normal in size and arterial enhancement. Adrenals/Urinary Tract: No adrenal nodule. Heterogeneous enhancement of both kidneys with mild perinephric edema. Loss of corticomedullary differentiation on the right. No frank hydronephrosis. 4.3 cm mildly complex cyst arising from the lower pole of the left kidney, suboptimally assessed on this arterial phase exam. Thin internal septations are seen. Mild bladder distension but no wall thickening. Stomach/Bowel: No bowel obstruction or inflammation. Scattered colonic diverticula without diverticulitis. Normal appendix. Lymphatic: Retroperitoneal adenopathy with  progression from prior exam. Dominant node is at the right common iliac station measuring 2.9 cm short axis series 4, image 143. This node previously measured 2.5 cm. Lymph node at the level of the upper right kidney measures 18 mm short axis series 4, image 88. Anterior periaortic node measures 2.1 cm series 4, image 103. There also enlarged peripancreatic and periportal nodes. Mesenteric adenopathy is suboptimally assessed on this arterial phase exam, but mesenteric lymph nodes have increased in size from prior. There also ileocolic nodes are enlarged. Bilateral pelvic adenopathy, 17 mm left external iliac node series 4, image 167, 14 mm right external iliac node series 4, image 166. Reproductive: Status post hysterectomy. No adnexal masses. Other: No ascites or free air. Small fat containing umbilical hernia. Musculoskeletal: The bones are diffusely heterogeneous. No acute osseous finding. No discrete osseous lesion. Bilateral hip osteoarthritis. Review of the MIP images confirms the above findings. IMPRESSION: 1. No aortic dissection or acute aortic abnormality. Moderate aortic atherosclerosis. 2. Bulky bilateral axillary, mesenteric, and retroperitoneal adenopathy, consistent with history of CLL. Adenopathy is progressed from 2021 exam. 3. Heterogeneous enhancement of both kidneys with mild perinephric edema, suspicious for pyelonephritis. Recommend correlation with urinalysis. 4. Colonic diverticulosis without diverticulitis. Aortic Atherosclerosis (ICD10-I70.0). Electronically Signed   By: Keith Rake M.D.   On: 05/29/2022 15:17   DG Chest 2 View  Result Date: 05/29/2022 CLINICAL DATA:  Dizziness. EXAM: CHEST - 2 VIEW COMPARISON:  Chest CT dated 10/15/2019 FINDINGS: Stable enlarged heart and tortuous and calcified thoracic aorta. Mild linear scarring in the lingula without significant change. Otherwise, clear lungs with normal vascularity. Mild thoracic spine degenerative changes. IMPRESSION: No  acute abnormality. Stable cardiomegaly. Electronically Signed   By: Claudie Revering  M.D.   On: 05/29/2022 12:29   CT Head Wo Contrast  Result Date: 05/29/2022 CLINICAL DATA:  Dizziness.  Numbness.  TIAs. EXAM: CT HEAD WITHOUT CONTRAST TECHNIQUE: Contiguous axial images were obtained from the base of the skull through the vertex without intravenous contrast. RADIATION DOSE REDUCTION: This exam was performed according to the departmental dose-optimization program which includes automated exposure control, adjustment of the mA and/or kV according to patient size and/or use of iterative reconstruction technique. COMPARISON:  09/15/2021 FINDINGS: Brain: No evidence of intracranial hemorrhage, acute infarction, hydrocephalus, extra-axial collection, or mass lesion/mass effect. Old lacunar infarct noted in left superior cerebellum. Vascular:  No hyperdense vessel or other acute findings. Skull: No evidence of fracture or other significant bone abnormality. Sinuses/Orbits:  No acute findings. Other: None. IMPRESSION: No acute intracranial abnormality. Electronically Signed   By: Marlaine Hind M.D.   On: 05/29/2022 12:23     EKG: unchanged from previous tracings, sinus bradycardia, LVH.   Physical Examination: Temp:  [97.9 F (36.6 C)-98.8 F (37.1 C)] 97.9 F (36.6 C) (02/11 1338) Pulse Rate:  [62-72] 72 (02/11 1335) Resp:  [16-18] 18 (02/11 1216) BP: (126-142)/(60-67) 138/65 (02/11 1335) SpO2:  [97 %-100 %] 100 % (02/11 1335)  General - Well nourished, well developed, in mild distress due to dyspnea. Respiratory: Regular, unlabored respirations on room air with SpO2 100%, accessory muscle use noted Mental Status -  Level of arousal and orientation to time, place, and person were intact. Language including expression, naming, repetition, comprehension was assessed and found intact.  Severely dysarthric. Attention span and concentration were normal. Cranial Nerves II - XII - II - Visual field intact  OU. III, IV, VI - Extraocular movements intact. V - Facial sensation intact bilaterally. VII - Facial movement intact bilaterally. VIII - Hearing & vestibular intact bilaterally. X - Palate elevates symmetrically. XI - shoulder shrug absent on left and weak on right XII - Tongue protrusion intact.  Motor Strength -0 out of 5 strength to left upper extremity, 3 out of 5 strength to right upper extremity, 2 out of 5 strength to right lower extremity, 0 out of 5 strength to left lower extremity Motor Tone - Muscle tone was assessed at the neck and was noted to be normal at the neck and decreased elsewhere.  Sensory - Light touch was assessed and found to be symmetrical.    Coordination -unable to perform  Gait and Station - deferred.   Assessment:  Ms. Vernitta Coste is a 62 y.o. female with history of diabetes, GBS, hypertension, several strokes, CKD, non-Hodgkin's lymphoma and recent right medullary stroke, for which she was transferred to rehabilitation presenting with new onset right-sided weakness and extension of right medullary stroke into the left side of the medulla. She did not receive IV TNK due to recent stroke and being outside of the window.  Patient is noted to have labored respirations, states she is having trouble breathing, is unable to finish sentences without pausing for breath and appears to be using accessory muscles.  Stroke: Extension of right medullary stroke into left side of medulla, as well as new punctate left cerebellar infarct CT head 2/10 no acute abnormality CTA head & neck pending MRI 2/11 increased size of right medullary infarct with extension into left medulla, new punctate acute left cerebellar infarct 2D Echo EF 60 to [id number], grade 1 diastolic dysfunction, mild concentric LVH, normal left atrial size, normal atrial septum LDL 123 HgbA1c 11.4  Lovenox for VTE prophylaxis Diet-n.p.o.  until swallow evaluation was completed.  Then follows all  recommendations    clopidogrel 75 mg daily and Pletal previously, now on aspirin 81 mg daily and Brilinta (ticagrelor) 90 mg bid.  Continue aspirin and Brilinta at least for 4 weeks and then switch to a single antiplatelet agent.  I am not sure why Pletal was used-will need further information. Therapy recommendations: Pending Disposition: Pending, likely SNF  Hypertension Stable Permissive hypertension (OK if < 220/120) but gradually normalize in 5-7 days Long-term BP goal normotensive  Hyperlipidemia Home meds: Atorvastatin 80 mg daily, ezetimibe 10 mg daily LDL 123, goal < 70 Consider lipid clinic referral if these medications unable to bring LDL down to goal Continue statin at discharge  Diabetes type II, Uncontrolled HgbA1c 11.4, goal < 7.0 CBGs SSI Semglee 12 units twice daily Insulin aspart 6 units 3 times daily with meals  Respiratory distress Patient noted to have labored respirations with accessory muscle use.  Could be secondary to bilateral brainstem infarct. Recommend transfer from rehabilitation to main hospital for closer monitoring, hospitalist to follow   Other Stroke Risk Factors Obesity, Body mass index is 31.64 kg/m., recommend weight loss, diet and exercise as appropriate  Hx multiple strokes  Other Active Problems Dysphagia-keep patient n.p.o. for now pending repeat swallow evaluation  Hospital day # 8   Stroke team to follow   [place] , MSN, AGACNP-BC [facility] See Amion for schedule and pager information 06/12/2022 3:01 PM     Attending Neurohospitalist Addendum Patient seen and examined with APP/Resident. Agree with the history and physical as documented above. Agree with the plan as documented, which I helped formulate. I have independently reviewed the chart, obtained history, review of systems and examined the patient.I have personally reviewed pertinent head/neck/spine imaging (CT/MRI).  Imaging reviewed  personally.  Plan was discussed with [address] PA-C. Please feel free to call with any questions.  -- Amie Portland, MD Neurologist [facility] Pager: [phone]

## 2022-06-12 NOTE — Progress Notes (Signed)
Pharmacy Antibiotic Note  [name] is a 62 y.o. female admitted on 06/12/2022 with pneumonia.  Pharmacy has been consulted for Unasyn dosing. CrCL 46 ml/min  Plan: Start Unasyn 3 gm IV q6hr Monitor renal function, clinical status and cultures.    Temp (24hrs), Avg:98.4 F (36.9 C), Min:97.9 F (36.6 C), Max:98.8 F (37.1 C)  Recent Labs  Lab 06/10/22 1249 06/12/22 1328  WBC 9.3 7.0  CREATININE 1.31* 1.30*    Estimated Creatinine Clearance: 46.4 mL/min (A) (by C-G formula based on SCr of 1.3 mg/dL (H)).    No Known Allergies  Thank you for allowing pharmacy to be a part of this patient's care.  Alanda Slim, PharmD, [facility] Clinical Pharmacist Please see AMION for all Pharmacists' Contact Phone Numbers 06/12/2022, 5:47 PM

## 2022-06-12 NOTE — Progress Notes (Signed)
Patient off the unit for MRI.

## 2022-06-12 NOTE — Progress Notes (Addendum)
This patient has had no changes for Probation officer. Writer received this patient yesterday morning from [facility]. The report writer was given is that patient  is lethargic, with left sided flaccid and right sided weakness. Slurred speech. Incontinent of B/B.  Liane Comber PT spoke to [facility] Nurse yesterday regarding patient is weaker. Dr Marciano Sequin notified and CT ordered yesterday. Writer was unaware this patient had so much change, however this is exactly how I received her yesterday. She has had no changes for me from yesterday at 7:15 AM when I rounded  up to this time.

## 2022-06-12 NOTE — Progress Notes (Signed)
Inpatient Rehabilitation Discharge Medication Review by a Pharmacist  A complete drug regimen review was completed for this patient to identify any potential clinically significant medication issues.  High Risk Drug Classes Is patient taking? Indication by Medication  Antipsychotic No   Anticoagulant Yes Enoxaparin SQ - VTE ppx  Antibiotic Yes, as an intravenous medication Amp/Sulbactam IV for suspected aspiration PNA  Opioid No   Antiplatelet Yes ASA and Ticagrelor for new / recrudescence of CVA  Hypoglycemics/insulin Yes SSI - DM  Vasoactive Medication Yes, as an intravenous medication Hydralazine IV - prn HTN  Chemotherapy No   Other No      Type of Medication Issue Identified Description of Issue Recommendation(s)  Drug Interaction(s) (clinically significant)     Duplicate Therapy  ASA, Plavix, Pletal, and Ticagrelor originally reordered. Antiplatelet therapy changed to ASA + Ticagrelor per neuro recommendations.  Allergy     No Medication Administration End Date     Incorrect Dose     Additional Drug Therapy Needed     Significant med changes from prior encounter (inform family/care partners about these prior to discharge). Several medications held with worsening of CVA and NPO status: Amlodipine, Zetia, Fluoxetine, Hydralazine PO, Hydroxyzine, Lantus, Metoprolol, Percocet, PO PPI Admitting MD will evaluate and resume PO meds as able.  Other       Clinically significant medication issues were identified that warrant physician communication and completion of prescribed/recommended actions by midnight of the next day:  Yes  Name of provider notified for urgent issues identified: Dr. Roosevelt Locks  Provider Method of Notification: secure chat utilized to clarify antiplatelet orders.    Pharmacist comments:   Time spent performing this drug regimen review (minutes):  40   Jarryn Altland, Rocky Crafts 06/12/2022 6:23 PM

## 2022-06-12 NOTE — Progress Notes (Signed)
Patient returned from MRI.

## 2022-06-13 ENCOUNTER — Inpatient Hospital Stay (HOSPITAL_COMMUNITY): Payer: Commercial Managed Care - HMO

## 2022-06-13 ENCOUNTER — Other Ambulatory Visit: Payer: Self-pay

## 2022-06-13 DIAGNOSIS — I63012 Cerebral infarction due to thrombosis of left vertebral artery: Secondary | ICD-10-CM

## 2022-06-13 DIAGNOSIS — I63312 Cerebral infarction due to thrombosis of left middle cerebral artery: Secondary | ICD-10-CM | POA: Diagnosis not present

## 2022-06-13 LAB — CBC
HCT: 32.4 % — ABNORMAL LOW (ref 36.0–46.0)
Hemoglobin: 10.6 g/dL — ABNORMAL LOW (ref 12.0–15.0)
MCH: 25.7 pg — ABNORMAL LOW (ref 26.0–34.0)
MCHC: 32.7 g/dL (ref 30.0–36.0)
MCV: 78.6 fL — ABNORMAL LOW (ref 80.0–100.0)
Platelets: 247 10*3/uL (ref 150–400)
RBC: 4.12 MIL/uL (ref 3.87–5.11)
RDW: 15.9 % — ABNORMAL HIGH (ref 11.5–15.5)
WBC: 6.1 10*3/uL (ref 4.0–10.5)
nRBC: 0 % (ref 0.0–0.2)

## 2022-06-13 LAB — LIPID PANEL
Cholesterol: 126 mg/dL (ref 0–200)
HDL: 28 mg/dL — ABNORMAL LOW (ref >40)
LDL Cholesterol: 81 mg/dL (ref 0–99)
Total CHOL/HDL Ratio: 4.5 RATIO
Triglycerides: 84 mg/dL (ref <150)
VLDL: 17 mg/dL (ref 0–40)

## 2022-06-13 LAB — GLUCOSE, CAPILLARY
Glucose-Capillary: 99 mg/dL (ref 70–99)
Glucose-Capillary: 123 mg/dL — ABNORMAL HIGH (ref 70–99)
Glucose-Capillary: 102 mg/dL — ABNORMAL HIGH (ref 70–99)
Glucose-Capillary: 216 mg/dL — ABNORMAL HIGH (ref 70–99)

## 2022-06-13 LAB — BASIC METABOLIC PANEL
Anion gap: 9 (ref 5–15)
BUN: 20 mg/dL (ref 8–23)
CO2: 20 mmol/L — ABNORMAL LOW (ref 22–32)
Calcium: 9.5 mg/dL (ref 8.9–10.3)
Chloride: 113 mmol/L — ABNORMAL HIGH (ref 98–111)
Creatinine, Ser: 1.22 mg/dL — ABNORMAL HIGH (ref 0.44–1.00)
GFR, Estimated: 50 mL/min — ABNORMAL LOW (ref >60)
Glucose, Bld: 106 mg/dL — ABNORMAL HIGH (ref 70–99)
Potassium: 4.1 mmol/L (ref 3.5–5.1)
Sodium: 142 mmol/L (ref 135–145)

## 2022-06-13 LAB — PROCALCITONIN: Procalcitonin: <0.1 ng/mL

## 2022-06-13 MED ORDER — PANTOPRAZOLE SODIUM 40 MG PO TBEC
40.0000 mg | DELAYED_RELEASE_TABLET | Freq: Every evening | ORAL | Status: DC
  Administered 2022-06-13 – 2022-06-20 (×8): 40 mg via ORAL
  Filled 2022-06-13 (×8): qty 1

## 2022-06-13 NOTE — Evaluation (Addendum)
Physical Therapy Evaluation Patient Details Name: [name] MRN: [id number] DOB: 1960/06/26 Today's Date: 06/13/2022  History of Present Illness  [name] is a 62 y.o. female found to have new onset of right-sided weakness and worsening of dysarthria and drooling at [facility] hospital inpatient rehab unit on 06/12/22.  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 Patient transferred to [facility] acute care.  Medical history significant of multiple strokes in 2022, 2023 and 2024, HTN, HLD, IDDM, PVD, non-Hodgkin's lymphoma, sickle cell trait, CKD stage IIIa  Clinical Impression   Pt admitted secondary to problem above with deficits below. Prior to Rt CVA, pt lived alone and was modified independent walking with a cane. On AIR, she was standing with RW with moderate assist and performing squat-pivot transfers with min-mod assist. Pt currently requires +2 total assist for all aspects of mobility. She is alert and able to follow commands with some delayed processing and/or motor planning. Anticipate patient will benefit from PT to address problems listed below.Will continue to follow acutely to maximize functional mobility independence and safety.          Recommendations for follow up therapy are one component of a multi-disciplinary discharge planning process, led by the attending physician.  Recommendations may be updated based on patient status, additional functional criteria and insurance authorization.  Follow Up Recommendations Acute inpatient rehab (3hours/day)      Assistance Recommended at Discharge Frequent or constant Supervision/Assistance  Patient can return home with the following  Two people to help with walking and/or transfers;Two people to help with bathing/dressing/bathroom;Assist for transportation;Help with stairs or ramp for entrance    Equipment Recommendations Wheelchair (measurements PT);Wheelchair cushion (measurements  PT);BSC/3in1;Hospital bed;Other (comment) (may need hoyer lift)  Recommendations for Other Services  Rehab consult    Functional Status Assessment Patient has had a recent decline in their functional status and demonstrates the ability to make significant improvements in function in a reasonable and predictable amount of time.     Precautions / Restrictions Precautions Precautions: Fall Restrictions Weight Bearing Restrictions: No      Mobility  Bed Mobility Overal bed mobility: Needs Assistance Bed Mobility: Supine to Sit, Sit to Supine, Rolling Rolling: Total assist   Supine to sit: Total assist, +2 for physical assistance, HOB elevated Sit to supine: Total assist, +2 for physical assistance   General bed mobility comments: helicopter technique to sit on left EOB; pt able to assist with moving legs toward EOB to prepare to sit up    Transfers                   General transfer comment: unsafe to attempt due to degree of trunk and extremity weakness    Ambulation/Gait                  Stairs            Wheelchair Mobility    Modified Rankin (Stroke Patients Only) Modified Rankin (Stroke Patients Only) Pre-Morbid Rankin Score: Moderate disability Modified Rankin: Severe disability     Balance Overall balance assessment: Needs assistance Sitting-balance support: Single extremity supported, Feet supported Sitting balance-Leahy Scale: Zero Sitting balance - Comments: losing balance primarily posterior and  right       Standing balance comment: unable to attempt                             Pertinent Vitals/Pain  Pain Assessment Pain Assessment: No/denies pain    Home Living Family/patient expects to be discharged to:: Private residence Living Arrangements: Alone Available Help at Discharge: Family Type of Home: Apartment Home Access: Stairs to enter Entrance Stairs-Rails: None Entrance Stairs-Number of Steps: 3   Home  Layout: One level Home Equipment: Conservation officer, nature (2 wheels)      Prior Function Prior Level of Function : Independent/Modified Independent             Mobility Comments: walked with a cane prior to initial R medullary stroke       Hand Dominance   Dominant Hand: Right    Extremity/Trunk Assessment   Upper Extremity Assessment Upper Extremity Assessment: Defer to OT evaluation    Lower Extremity Assessment Lower Extremity Assessment: RLE deficits/detail;LLE deficits/detail RLE Deficits / Details: hip flex 1+, hip extension 2, knee extension 0, ankle DF 0 RLE Sensation: decreased light touch LLE Deficits / Details: hip flexion 3+, hip extension 3+, knee extension 3+, ankle DF 0, PF 2+ LLE Sensation: decreased light touch    Cervical / Trunk Assessment Cervical / Trunk Assessment: Other exceptions Cervical / Trunk Exceptions: pt with bil trunk weakness in sitting with very slouched posture with rounded shoulders and forward, flexed head; able to initiate trunk extension with scapular retraction and cervical extension  Communication   Communication: Expressive difficulties  Cognition Arousal/Alertness: Awake/alert Behavior During Therapy: WFL for tasks assessed/performed Overall Cognitive Status: Impaired/Different from baseline Area of Impairment: Problem solving                             Problem Solving: Slow processing, Decreased initiation, Requires verbal cues, Requires tactile cues, Difficulty sequencing General Comments: required incr time for all movements she was asked to perform (Rt slightly longer time compared to left)        General Comments      Exercises     Assessment/Plan    PT Assessment Patient needs continued PT services  PT Problem List Decreased strength;Decreased balance;Decreased mobility;Decreased activity tolerance;Decreased coordination;Impaired sensation;Impaired tone;Obesity       PT Treatment Interventions DME  instruction;Functional mobility training;Balance training;Patient/family education;Gait training;Therapeutic activities;Neuromuscular re-education;Therapeutic exercise;Wheelchair mobility training    PT Goals (Current goals can be found in the Care Plan section)  Acute Rehab PT Goals Patient Stated Goal: to get better PT Goal Formulation: With patient Time For Goal Achievement: 06/27/22 Potential to Achieve Goals: Good    Frequency Min 3X/week     Co-evaluation PT/OT/SLP Co-Evaluation/Treatment: Yes Reason for Co-Treatment: For patient/therapist safety;To address functional/ADL transfers PT goals addressed during session: Mobility/safety with mobility;Balance;Strengthening/ROM         AM-PAC PT "6 Clicks" Mobility  Outcome Measure Help needed turning from your back to your side while in a flat bed without using bedrails?: Total Help needed moving from lying on your back to sitting on the side of a flat bed without using bedrails?: Total Help needed moving to and from a bed to a chair (including a wheelchair)?: Total Help needed standing up from a chair using your arms (e.g., wheelchair or bedside chair)?: Total Help needed to walk in hospital room?: Total Help needed climbing 3-5 steps with a railing? : Total 6 Click Score: 6    End of Session   Activity Tolerance: Patient limited by fatigue Patient left: in bed;with call bell/phone within reach;with bed alarm set;with family/visitor present Nurse Communication: Mobility status;Need for lift equipment PT Visit Diagnosis:  Hemiplegia and hemiparesis;Difficulty in walking, not elsewhere classified (R26.2) Hemiplegia - Right/Left: Right Hemiplegia - dominant/non-dominant: Dominant Hemiplegia - caused by: Cerebral infarction    Time: 1115-1141 PT Time Calculation (min) (ACUTE ONLY): 26 min   Charges:   PT Evaluation $PT Eval Moderate Complexity: 1 Mod           Arby Barrette, PT Acute Rehabilitation Services  Office  [phone]   Rexanne Mano 06/13/2022, 1:45 PM

## 2022-06-13 NOTE — Progress Notes (Addendum)
STROKE TEAM PROGRESS NOTE   INTERVAL HISTORY Her sister is at the bedside.  She was previously on rehab after her stroke on 05/30/22. She had worsening dysarthria and weakness which prompted an MRI that shows a left medullary stroke in addition to her right medullary stroke.  MR angiogram of the brain on 05/30/2022 had shown severe bilateral left V4 stenosis which is likely symptomatic  Vitals:   06/12/22 1647 06/12/22 2043 06/13/22 0344  BP: (!) 156/77 133/65 (!) 151/73  Pulse: 61 60   Resp: 16 14   Temp: 98.7 F (37.1 C) 98.2 F (36.8 C)   TempSrc: Oral Axillary   SpO2: 97% 98%    CBC:  Recent Labs  Lab 06/10/22 1249 06/12/22 1328 06/13/22 0720  WBC 9.3 7.0 6.1  NEUTROABS 4.9  --   --   HGB 11.4* 11.0* 10.6*  HCT 33.9* 32.9* 32.4*  MCV 76.7* 78.3* 78.6*  PLT 281 244 [id number]   Basic Metabolic Panel:  Recent Labs  Lab 06/10/22 1249 06/12/22 1328  NA 137 138  K 4.1 4.1  CL 106 111  CO2 23 21*  GLUCOSE 114* 104*  BUN 28* 24*  CREATININE 1.31* 1.30*  CALCIUM 9.8 9.6   Lipid Panel: No results for input(s): "CHOL", "TRIG", "HDL", "CHOLHDL", "VLDL", "LDLCALC" in the last 168 hours. HgbA1c: No results for input(s): "HGBA1C" in the last 168 hours. Urine Drug Screen: No results for input(s): "LABOPIA", "COCAINSCRNUR", "LABBENZ", "AMPHETMU", "THCU", "LABBARB" in the last 168 hours.  Alcohol Level No results for input(s): "ETH" in the last 168 hours.  IMAGING past 24 hours DG Chest 1 View  Result Date: 06/13/2022 CLINICAL DATA:  Aspiration pneumonia EXAM: CHEST  1 VIEW COMPARISON:  Radiograph 06/12/2022 FINDINGS: Unchanged cardiomediastinal silhouette. Bandlike atelectasis in the lower lungs. No new airspace disease. No pleural effusion or evidence of pneumothorax. Bones are unchanged. IMPRESSION: No new airspace disease. Electronically Signed   By: Maurine Simmering M.D.   On: 06/13/2022 07:58   DG Chest 1 View  Result Date: 06/12/2022 CLINICAL DATA:  Pneumonia EXAM: CHEST  1 VIEW  COMPARISON:  Chest x-ray 05/29/2022 FINDINGS: Cardiomediastinal silhouette is within normal limits. There is a linear band of atelectasis or scarring in the left mid lung similar to prior. Lungs are otherwise clear. No pleural effusion or pneumothorax. No acute fractures. IMPRESSION: No acute cardiopulmonary process.  No significant interval change. Electronically Signed   By: Ronney Asters M.D.   On: 06/12/2022 16:17   MR BRAIN WO CONTRAST  Result Date: 06/12/2022 CLINICAL DATA:  Neuro deficit, acute, stroke suspected. New right sided weakness, worsening aphasia. EXAM: MRI HEAD WITHOUT CONTRAST TECHNIQUE: Multiplanar, multiecho pulse sequences of the brain and surrounding structures were obtained without intravenous contrast. COMPARISON:  Head CT 06/11/2022 and MRI/MRA 05/30/2022 FINDINGS: Multiple sequences are up to moderately motion degraded. Brain: The acute infarct in the right ventral medulla on the prior MRI has enlarged and now extends to the left of midline. There is also a new punctate acute infarct in the left cerebellar hemisphere. Small T2 hyperintensities in the cerebral white matter bilaterally are unchanged and nonspecific but compatible with mild chronic small vessel ischemic disease chronic microhemorrhages are again noted in the right thalamus and right parietal white matter. Small chronic bilateral cerebellar infarcts are again noted with hemosiderin deposition associated with 1 of the left-sided infarcts. The ventricles and sulci are normal. No mass, midline shift, or extra-axial fluid collection is identified. Vascular: Unchanged abnormal appearance of the distal  right vertebral artery, more fully evaluated on the prior MRA. Skull and upper cervical spine: No suspicious marrow lesion. Sinuses/Orbits: Unremarkable orbits. Mucous retention cyst in the right maxillary sinus. Small left mastoid effusion. Other: Partially visualized bilateral cervical lymphadenopathy with history of lymphoma.  IMPRESSION: 1. Increased size of a recent right medullary infarct which now extends to the left of midline. 2. New punctate acute left cerebellar infarct. 3. Mild chronic small vessel ischemic disease. Chronic cerebellar infarcts. These results will be called to the ordering clinician or representative by the Radiologist Assistant, and communication documented in the PACS or [employer]. Electronically Signed   By: Logan Bores M.D.   On: 06/12/2022 13:24    PHYSICAL EXAM General - Well nourished, well developed pleasant middle-aged African-American lady, in mild distress due to dyspnea. Respiratory: Regular, unlabored respirations  Mental Status - Level of arousal and orientation to time, place, and person were intact. Language naming, repetition, comprehension are intact.  Severely dysarthric. Attention span and concentration were normal. Cranial Nerves II - XII - II - Visual field intact OU. III, IV, VI - Extraocular movements intact. V - Facial sensation intact bilaterally. VII - Facial movement intact bilaterally. VIII - Hearing & vestibular intact bilaterally. X - Palate elevates symmetrically. XI - shoulder shrug absent on left and weak on right XII - Tongue protrusion intact.   Motor Strength -1 out of 5 strength to left upper extremity, 3 out of 5 strength to right upper extremity, 2 out of 5 strength to right lower extremity, 1 out of 5 strength to left lower extremity Motor Tone - Muscle tone was assessed at the neck and was noted to be normal at the neck and decreased elsewhere.   Sensory - Light touch was assessed and found to be symmetrical.     Coordination -unable to perform   Gait and Station - deferred.   ASSESSMENT/PLAN [name] is a 62 y.o. female with history of diabetes, GBS in 1988, hypertension, several strokes, CKD, non-Hodgkin's lymphoma and questionable history of possible prior GBS who was originally admitted on 1/28 with dizziness diplopia and  unusual sensations through her body. Yesterday morning, noted to be different from her clinical condition of yesterday with worsening right-sided weakness, difficulty breathing and more slurred speech than baseline. Repeat MRI demonstrates extension of known right medullary stroke to now and all the left side.  Patient is noted to have labored respirations with visible accessory muscle use and states that she is having a hard time breathing.  Stroke:  Acute left cerebellar infarct and extension of the right medullary infarct to the left Etiology:  severe posterior circulation narrowing of bilateral terminal vertebral arteries 2/11- MRI Increased size of a recent right medullary infarct which now extends to the left of midline. New punctate acute left cerebellar infarct MRA  Head-1. Diminutive enhancement of the right V4 segment with intermittent occlusion, and multifocal severe stenosis/near occlusion of the left V4 segment, similar to the prior CTA. Additional intracranial atherosclerotic disease as detailed above resulting in moderate stenosis of the left cavernous ICA, mild-to-moderate stenosis of the right supraclinoid ICA, severe stenosis of the origin of the left superior M2 division, severe stenosis of the distal right M1 segment, and severe stenosis of the proximal left P1 segment.  MRA Neck- 1. 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 Moderate stenosis of the origin of the left vertebral artery, not significantly changed. 2D Echo EF 60-65% LDL  123 HgbA1c 11.4 VTE prophylaxis -lovenox    Diet   Diet NPO time specified   clopidogrel 75 mg daily and pletal 73m BID prior to admission, now on aspirin 81 mg daily and Brilinta (ticagrelor) 90 mg bid. X 3 months followed by aspirin alone Therapy recommendations:  pending Disposition:  pending  Hypertension Home meds:  coozar, norvasc  Stable Permissive hypertension (OK if < 220/120) but gradually  normalize in 5-7 days Long-term BP goal normotensive  Hyperlipidemia Home meds:  atorvastatin 859m resumed in hospital LDL 123, goal < 70 Continue statin at discharge  Diabetes type II Uncontrolled Home meds:  trulicity  [id number] < 7.0 CBGs Recent Labs    06/11/22 2105 06/12/22 0613 06/12/22 1129  GLUCAP 161* 102* 128*    SSI  Other Stroke Risk Factors Hx stroke/TIA Previous stroke- 1/29 Right medullary stroke  09/17/2021 Bilateral cerebellum punctate infarcts, likely secondary to large vessel disease source from bilateral VA occlusion  02/08/2021 Two acute/early subacute infarcts within the left pons measuring up to 8 mm.  Other Active Problems History of lymphoma Diabetic neuropathy  Hospital day # 1  Patient seen and examined by NP/APP with MD. MD to update note as needed.   DeJanine OresDNP, FNP-BC [facility] Pager: [phone] MD NOTE : I have personally obtained history,examined this patient, reviewed notes, independently viewed imaging studies, participated in medical decision making and plan of care.ROS completed by me personally and pertinent positives fully documented  I have made any additions or clarifications directly to the above note. Agree with note above.  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  She remains at risk for neurological worsening needs aggressive medical management with dual antiplatelet therapy aspirin and Brilinta for 3 months if she can afford it followed by aspirin alone otherwise may consider switching to aspirin and Plavix after 4 weeks.  Strict control of hypertension and blood pressure goal below 130/90, lipids with LDL cholesterol goal below 70  mg percent and diabetes with hemoglobin A1c goal below 6.5%.  Continue physical occupational therapy and speech therapy for swallow eval.  Transfer back to rehab when stable in the next few days.  Patient may also perhaps consider participation in the CABeetroke prevention study if interested.  I will give information to patient and sister to review and decide.  Greater than 50% time during this 50-minute visit was spent in counseling and coordination of care and discussion with patient and care team and sister and answering questions.  PrAntony ContrasMD Medical Director [facility] Pager: [phone]/04/2023 2:17 PM   To contact Stroke Continuity provider, please refer to [email or web address], contact General Neurology

## 2022-06-13 NOTE — Evaluation (Signed)
Speech Language Pathology Evaluation Patient Details Name: [name] MRN: [id number] DOB: 29-Aug-1960 Today's Date: 06/13/2022 Time: [id number] SLP Time Calculation (min) (ACUTE ONLY): 25 min  Problem List:  Patient Active Problem List   Diagnosis Date Noted   Dysarthria 06/12/2022   Aspiration pneumonia ([place]) 06/12/2022   Stroke ([place]) 06/12/2022   Anxiety disorder due to medical condition 06/10/2022   AKI (acute kidney injury) ([place]) 06/01/2022   Dizziness 05/29/2022   Personal history of CLL (chronic lymphocytic leukemia) 05/29/2022   Abdominal pain 05/29/2022   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 disease, with long-term current use of insulin ([place]) 04/14/2022   Type 2 diabetes mellitus with diabetic polyneuropathy, with long-term current use of insulin ([place]) 04/14/2022   Routine general medical examination at a health care facility 03/16/2022   Hyperlipidemia LDL goal <70 10/01/2021   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[employer]) 09/17/2021   Stage 3a chronic kidney disease (CKD) ([place]) 09/16/2021   Thyroid nodule 09/16/2021   Cervical lymphadenopathy 09/16/2021   Acute CVA (cerebrovascular accident) ([place]) 09/15/2021   GERD (gastroesophageal reflux disease) 04/30/2021   Peripheral arterial disease ([place]) 04/13/2021   Atherosclerosis of aorta ([place]) 02/14/2021   Cerebral thrombosis with cerebral infarction 02/08/2021   Abnormal MRI of head 02/07/2021   Vertigo 02/07/2021   Class 1 obesity due to excess calories with body mass index (BMI) of 31.0 to 31.9 in adult 02/07/2021   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   Trigger finger, left ring finger 10/04/2018   Alternating constipation and diarrhea 04/20/2018   Diabetic peripheral neuropathy associated with type 2 diabetes mellitus ([place]) 02/26/2018   DM (diabetes mellitus), type 2 with peripheral vascular complications ([place]) [id number]   ABNORMALITY OF GAIT 05/10/2010   ECZEMA, ATOPIC 04/03/2009    Hyperlipidemia associated with type 2 diabetes mellitus ([place]) 03/06/2009   BACK PAIN 11/14/2008   Cerebrovascular disease 08/05/2008   DIPLOPIA 07/15/2008   Essential hypertension 07/15/2008   Past Medical History:  Past Medical History:  Diagnosis Date   Abnormality of gait 05/10/2010   BACK PAIN 11/14/2008   Class 1 obesity due to excess calories with body mass index (BMI) of 31.0 to 31.9 in adult 02/07/2021   DIABETES MELLITUS, TYPE II 07/15/2008   Diplopia 07/15/2008   ECZEMA, ATOPIC 04/03/2009   GERD (gastroesophageal reflux disease)    Guillain-Barre ([place]) 1988   HYPERLIPIDEMIA 03/06/2009   HYPERTENSION 07/15/2008   Stroke ([place]) 2010, 2011   x2    Vertebral artery stenosis    Past Surgical History:  Past Surgical History:  Procedure Laterality Date   ABDOMINAL HYSTERECTOMY     DILATION AND CURETTAGE OF UTERUS     FOOT SURGERY     IR ANGIO INTRA EXTRACRAN SEL COM CAROTID INNOMINATE UNI L MOD SED  09/17/2021   IR ANGIO INTRA EXTRACRAN SEL INTERNAL CAROTID UNI R MOD SED  09/17/2021   IR ANGIO VERTEBRAL SEL VERTEBRAL UNI R MOD SED  09/17/2021   IR US GUIDE VASC ACCESS RIGHT  09/17/2021   HPI:  [name] is a 62 y.o. female with medical history significant of multiple strokes in 2022, 2023 and 2024, HTN, HLD, IDDM, PVD, non-Hodgkin's lymphoma, sickle cell trait, CKD stage IIIa, was found to have new onset of right-sided weakness and worsening of dysarthria and drooling at [facility] hospital inpatient rehab unit; CXR 06/13/22 no new airspace disease; MRI head 06/12/22 indicated  Increased size of a recent right medullary infarct which now  extends to the left  of midline.  2. New punctate acute left cerebellar infarct.  ST consulted for SLE (Speech/language cognitive assessment).   Assessment / Plan / Recommendation Clinical Impression  Pt seen for speech /language cognitive assessment with baseline deficits identified as impaired intermittent memory recall per pt report, but chart  review revealed pt required mod I for executive functions (ie: financial problem solving) during AIR stay prior to this CVA.  Pt with new onset of dysarthria with speech being judged to be 50-75% intelligible within words-sentences.  Pt stated she has noticed change in speech as she "talks like a baby" per report.  Pt given compensatory strategies including slowed rate and increasing vocal intensity during various speaking attempts with success and pt improved accuracy to 75% overall.  Pt denies dysphagia and passed swallow screen per nursing report.  Pt able to follow simple directives with slower processing (accurate, but decreased ability to generate ideas/answers quickly) and answer questions appropriately.  Pt was Ox4.  OME revealed slight facial weakness (more noted on L) with lingual deviation to L during protrusion.  Pt noted drooling from R/L during onset of CVA, but this was not observed during OME.  Pt's cognition appeared to be at baseline, but pt may benefit from extensive re-assessment once d/c to AIR or next venue of care.  ST will f/u in acute setting for dysarthria tx and ongoing cognitive assessment.    SLP Assessment  SLP Recommendation/Assessment: Patient needs continued Speech Language Pathology Services SLP Visit Diagnosis: Cognitive communication deficit (R41.841);Dysarthria and anarthria (R47.1)    Recommendations for follow up therapy are one component of a multi-disciplinary discharge planning process, led by the attending physician.  Recommendations may be updated based on patient status, additional functional criteria and insurance authorization.    Follow Up Recommendations  Acute inpatient rehab (3hours/day)    Assistance Recommended at Discharge  Frequent or constant Supervision/Assistance  Functional Status Assessment Patient has had a recent decline in their functional status and demonstrates the ability to make significant improvements in function in a reasonable and  predictable amount of time.  Frequency and Duration min 1 x/week  1 week      SLP Evaluation Cognition  Overall Cognitive Status: Impaired/Different from baseline Arousal/Alertness: Awake/alert Orientation Level: Oriented X4 Year: 2024 Month: February Day of Week: Correct Attention: Sustained Sustained Attention: Impaired (DTA; overall decreased processing, accurate, but slow) Sustained Attention Impairment: Verbal basic;Functional basic Memory: Impaired Memory Impairment: Other (comment) (STM deficits at baseline) Awareness: Appears intact Safety/Judgment: Appears intact       Comprehension  Auditory Comprehension Overall Auditory Comprehension: Appears within functional limits for tasks assessed Commands: Within Functional Limits Conversation: Simple Interfering Components: Processing speed;Working Marine scientist;Other (comment) (dysarthria) EffectiveTechniques: Extra processing time;Increased volume Visual Recognition/Discrimination Discrimination: Not tested Reading Comprehension Reading Status: Not tested    Expression Expression Primary Mode of Expression: Verbal Verbal Expression Overall Verbal Expression: Appears within functional limits for tasks assessed Level of Generative/Spontaneous Verbalization: Sentence Interfering Components: Speech intelligibility Non-Verbal Means of Communication: Not applicable Written Expression Dominant Hand: Right Written Expression: Not tested   Oral / Motor  Oral Motor/Sensory Function Overall Oral Motor/Sensory Function: Mild impairment Facial ROM: Reduced right;Reduced left Facial Symmetry: Abnormal symmetry left Lingual Symmetry: Abnormal symmetry left Lingual Strength: Suspected CN XII (hypoglossal) dysfunction Lingual Sensation: Suspected CN VII (facial) dysfunction-anterior 2/3 tongue Velum: Within Functional Limits Mandible: Suspected CN V (Trigeminal) dysfunction Motor Speech Overall Motor Speech: Other (comment)  (DTA) Respiration: Impaired Level of Impairment: Word Phonation: Low vocal intensity Resonance:  Within functional limits Articulation: Impaired Level of Impairment: Word Intelligibility: Intelligibility reduced Word: 50-74% accurate Phrase: 25-49% accurate Sentence: Not tested Conversation: Not tested Motor Planning: Not tested Motor Speech Errors: Not applicable Effective Techniques: Slow rate;Increased vocal intensity            Pat Ephram Kornegay,M.S., CCC-SLP 06/13/2022, 2:02 PM

## 2022-06-13 NOTE — Progress Notes (Signed)
Inpatient Rehab Admissions Coordinator:   Per therapy recommendations, patient was screened for CIR candidacy by Clemens Catholic, MS, CCC-SLP. At this time, Pt. is total A with bed mobility and not able to tolerate OOB. It is currently not clear that Pt. Will be able to participate well enough to return to CIR. I will hold off on entering consult at this time; however,  Pt. may have potential to progress to becoming a potential CIR candidate, so CIR admissions team will follow and monitor for progress and participation with therapies and place consult order if Pt. appears to be an appropriate candidate. Please contact me with any questions.   Clemens Catholic, [place], [place] Admissions Coordinator  [phone] ([place]) [phone] (office)

## 2022-06-13 NOTE — Plan of Care (Signed)
Problem: Education: Goal: Knowledge of General Education information will improve Description: Including pain rating scale, medication(s)/side effects and non-pharmacologic comfort measures 06/13/2022 0216 by Karma Lew, RN Outcome: Not Progressing 06/13/2022 0216 by Karma Lew, RN Outcome: Not Progressing   Problem: Health Behavior/Discharge Planning: Goal: Ability to manage health-related needs will improve 06/13/2022 0216 by Karma Lew, RN Outcome: Not Progressing 06/13/2022 0216 by Karma Lew, RN Outcome: Not Progressing   Problem: Clinical Measurements: Goal: Ability to maintain clinical measurements within normal limits will improve 06/13/2022 0216 by Karma Lew, RN Outcome: Not Progressing 06/13/2022 0216 by Karma Lew, RN Outcome: Not Progressing Goal: Will remain free from infection 06/13/2022 0216 by Karma Lew, RN Outcome: Not Progressing 06/13/2022 0216 by Karma Lew, RN Outcome: Not Progressing Goal: Diagnostic test results will improve 06/13/2022 0216 by Karma Lew, RN Outcome: Not Progressing 06/13/2022 0216 by Karma Lew, RN Outcome: Not Progressing Goal: Respiratory complications will improve 06/13/2022 0216 by Karma Lew, RN Outcome: Not Progressing 06/13/2022 0216 by Karma Lew, RN Outcome: Not Progressing Goal: Cardiovascular complication will be avoided 06/13/2022 0216 by Karma Lew, RN Outcome: Not Progressing 06/13/2022 0216 by Karma Lew, RN Outcome: Not Progressing   Problem: Activity: Goal: Risk for activity intolerance will decrease 06/13/2022 0216 by Karma Lew, RN Outcome: Not Progressing 06/13/2022 0216 by Karma Lew, RN Outcome: Not Progressing   Problem: Nutrition: Goal: Adequate nutrition will be maintained 06/13/2022 0216 by Karma Lew, RN Outcome: Not Progressing 06/13/2022 0216 by Karma Lew, RN Outcome: Not  Progressing   Problem: Coping: Goal: Level of anxiety will decrease 06/13/2022 0216 by Karma Lew, RN Outcome: Not Progressing 06/13/2022 0216 by Karma Lew, RN Outcome: Not Progressing   Problem: Elimination: Goal: Will not experience complications related to bowel motility 06/13/2022 0216 by Karma Lew, RN Outcome: Not Progressing 06/13/2022 0216 by Karma Lew, RN Outcome: Not Progressing Goal: Will not experience complications related to urinary retention 06/13/2022 0216 by Karma Lew, RN Outcome: Not Progressing 06/13/2022 0216 by Karma Lew, RN Outcome: Not Progressing   Problem: Pain Managment: Goal: General experience of comfort will improve 06/13/2022 0216 by Karma Lew, RN Outcome: Not Progressing 06/13/2022 0216 by Karma Lew, RN Outcome: Not Progressing   Problem: Safety: Goal: Ability to remain free from injury will improve 06/13/2022 0216 by Karma Lew, RN Outcome: Not Progressing 06/13/2022 0216 by Karma Lew, RN Outcome: Not Progressing   Problem: Skin Integrity: Goal: Risk for impaired skin integrity will decrease 06/13/2022 0216 by Karma Lew, RN Outcome: Not Progressing 06/13/2022 0216 by Karma Lew, RN Outcome: Not Progressing   Problem: Education: Goal: Ability to describe self-care measures that may prevent or decrease complications (Diabetes Survival Skills Education) will improve 06/13/2022 0216 by Karma Lew, RN Outcome: Not Progressing 06/13/2022 0216 by Karma Lew, RN Outcome: Not Progressing Goal: Individualized Educational Video(s) 06/13/2022 0216 by Karma Lew, RN Outcome: Not Progressing 06/13/2022 0216 by Karma Lew, RN Outcome: Not Progressing   Problem: Coping: Goal: Ability to adjust to condition or change in health will improve 06/13/2022 0216 by Karma Lew, RN Outcome: Not Progressing 06/13/2022 0216 by Karma Lew,  RN Outcome: Not Progressing   Problem: Fluid Volume: Goal: Ability to maintain a balanced intake and output will improve 06/13/2022 0216 by Karma Lew, RN Outcome: Not Progressing 06/13/2022 0216 by Rosemarie Ax  M, RN Outcome: Not Progressing   Problem: Health Behavior/Discharge Planning: Goal: Ability to identify and utilize available resources and services will improve 06/13/2022 0216 by Karma Lew, RN Outcome: Not Progressing 06/13/2022 0216 by Karma Lew, RN Outcome: Not Progressing Goal: Ability to manage health-related needs will improve 06/13/2022 0216 by Karma Lew, RN Outcome: Not Progressing 06/13/2022 0216 by Karma Lew, RN Outcome: Not Progressing   Problem: Metabolic: Goal: Ability to maintain appropriate glucose levels will improve 06/13/2022 0216 by Karma Lew, RN Outcome: Not Progressing 06/13/2022 0216 by Karma Lew, RN Outcome: Not Progressing   Problem: Nutritional: Goal: Maintenance of adequate nutrition will improve 06/13/2022 0216 by Karma Lew, RN Outcome: Not Progressing 06/13/2022 0216 by Karma Lew, RN Outcome: Not Progressing Goal: Progress toward achieving an optimal weight will improve 06/13/2022 0216 by Karma Lew, RN Outcome: Not Progressing 06/13/2022 0216 by Karma Lew, RN Outcome: Not Progressing   Problem: Skin Integrity: Goal: Risk for impaired skin integrity will decrease 06/13/2022 0216 by Karma Lew, RN Outcome: Not Progressing 06/13/2022 0216 by Karma Lew, RN Outcome: Not Progressing   Problem: Tissue Perfusion: Goal: Adequacy of tissue perfusion will improve 06/13/2022 0216 by Karma Lew, RN Outcome: Not Progressing 06/13/2022 0216 by Karma Lew, RN Outcome: Not Progressing   Problem: Education: Goal: Knowledge of disease or condition will improve 06/13/2022 0216 by Karma Lew, RN Outcome: Not Progressing 06/13/2022 0216  by Karma Lew, RN Outcome: Not Progressing Goal: Knowledge of secondary prevention will improve (MUST DOCUMENT ALL) 06/13/2022 0216 by Karma Lew, RN Outcome: Not Progressing 06/13/2022 0216 by Karma Lew, RN Outcome: Not Progressing Goal: Knowledge of patient specific risk factors will improve Elta [place] N/A or DELETE if not current risk factor) 06/13/2022 0216 by Karma Lew, RN Outcome: Not Progressing 06/13/2022 0216 by Karma Lew, RN Outcome: Not Progressing   Problem: Ischemic Stroke/TIA Tissue Perfusion: Goal: Complications of ischemic stroke/TIA will be minimized 06/13/2022 0216 by Karma Lew, RN Outcome: Not Progressing 06/13/2022 0216 by Karma Lew, RN Outcome: Not Progressing   Problem: Coping: Goal: Will verbalize positive feelings about self 06/13/2022 0216 by Karma Lew, RN Outcome: Not Progressing 06/13/2022 0216 by Karma Lew, RN Outcome: Not Progressing Goal: Will identify appropriate support needs 06/13/2022 0216 by Karma Lew, RN Outcome: Not Progressing 06/13/2022 0216 by Karma Lew, RN Outcome: Not Progressing   Problem: Health Behavior/Discharge Planning: Goal: Ability to manage health-related needs will improve 06/13/2022 0216 by Karma Lew, RN Outcome: Not Progressing 06/13/2022 0216 by Karma Lew, RN Outcome: Not Progressing Goal: Goals will be collaboratively established with patient/family 06/13/2022 0216 by Karma Lew, RN Outcome: Not Progressing 06/13/2022 0216 by Karma Lew, RN Outcome: Not Progressing   Problem: Self-Care: Goal: Ability to participate in self-care as condition permits will improve 06/13/2022 0216 by Karma Lew, RN Outcome: Not Progressing 06/13/2022 0216 by Karma Lew, RN Outcome: Not Progressing Goal: Verbalization of feelings and concerns over difficulty with self-care will improve 06/13/2022 0216 by Karma Lew,  RN Outcome: Not Progressing 06/13/2022 0216 by Karma Lew, RN Outcome: Not Progressing Goal: Ability to communicate needs accurately will improve 06/13/2022 0216 by Karma Lew, RN Outcome: Not Progressing 06/13/2022 0216 by Karma Lew, RN Outcome: Not Progressing   Problem: Nutrition: Goal: Risk of aspiration will decrease 06/13/2022 0216 by Karma Lew, RN Outcome: Not Progressing  06/13/2022 0216 by Karma Lew, RN Outcome: Not Progressing Goal: Dietary intake will improve 06/13/2022 0216 by Karma Lew, RN Outcome: Not Progressing 06/13/2022 0216 by Karma Lew, RN Outcome: Not Progressing   Problem: Education: Goal: Knowledge of disease or condition will improve Outcome: Not Progressing Goal: Knowledge of secondary prevention will improve (MUST DOCUMENT ALL) Outcome: Not Progressing Goal: Knowledge of patient specific risk factors will improve Elta [place] N/A or DELETE if not current risk factor) Outcome: Not Progressing   Problem: Ischemic Stroke/TIA Tissue Perfusion: Goal: Complications of ischemic stroke/TIA will be minimized Outcome: Not Progressing   Problem: Coping: Goal: Will verbalize positive feelings about self Outcome: Not Progressing Goal: Will identify appropriate support needs Outcome: Not Progressing   Problem: Health Behavior/Discharge Planning: Goal: Ability to manage health-related needs will improve Outcome: Not Progressing Goal: Goals will be collaboratively established with patient/family Outcome: Not Progressing

## 2022-06-13 NOTE — Progress Notes (Signed)
Occupational Therapy Discharge Note  This patient was unable to complete the inpatient rehab program due to transfer to acute services; therefore did not meet their long term goals. Pt left the program at a max assist level for their  functional ADLs. This patient is being discharged from OT services at this time.  BIMS at time of d/c  Pt unable to complete due to medical status  See CareTool for functional status details.  If the patient is able to return to inpatient rehabilitation within 3 midnights, this may be considered an interrupted stay and therapy services will resume as ordered. Modification and reinstatement of their goals will be made upon completion of therapy service reevaluations.

## 2022-06-13 NOTE — Progress Notes (Signed)
PROGRESS NOTE    Kashika Kross  [id number] DOB: 09-09-1960 DOA: 06/12/2022 PCP: [name], [name], MD   Brief Narrative:  Julyssa Blackler is a 62 y.o. female with medical history significant of multiple strokes in 2022, 2023 and 2024, HTN, HLD, IDDM, PVD, non-Hodgkin's lymphoma, sickle cell trait, CKD stage IIIa, was found to have new onset of right-sided weakness and worsening of dysarthria and drooling at [facility] hospital inpatient rehab unit.   Patient admitted 2 weeks ago secondary to new onset of right medulla stroke,subsequently discharged to inpt rehab - L sided weakness/dysarthria noted at time of discharge. Staff at rehab on 06/12/22 noted seemingly new weakness of right arm and hand, when patient was unable to hold anything on her right hand as well as worsening of slurred speech as well as new left-sided drooling.  Neurology consulted, given that exact onset of her symptoms is unknown, decision made to not give TNK.  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 Admitted to hospitalist service with neurology consult.  Assessment & Plan:   Principal Problem:   Stroke [facility]) Active Problems:   Acute CVA (cerebrovascular accident) ([place])   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[place])   Dysarthria   Aspiration pneumonia ([place])   Acute extension of previous stroke, with new onset of right-sided paresis, dysphagia and facial droop -MRI confirms "Increased size of a recent right medullary infarct which now extends to the left of midline.New punctate acute left cerebellar infarct." -Neurology following, recommending aspirin and Brilinta, p.o. status remains tentative, unable to have Brilinta over the past 24 hours although rectal aspirin had been administered. -Permissive hypertension per neurology goal less than 200/110 -PT, OT, speech following, may require NG tube placement in the next 24 to 48 hours if unable to tolerate p.o. safely to ensure  adequate nutrition hydration and medication route. -Long-term prognosis poor given recurrent strokes in last 2 years with 2 new successive strokes in 1 month despite appropriate medical treatment.     Probable aspiration pneumonitis Pneumonia less likely but cannot be ruled out -Secondary to above presumed worsening dysphagia - Given acuity unlikely to be infectious rather than reactive inflammatory/pneumonitis - covering with 5 days of unasyn is not unreasonable though given her risk for ongoing aspiration, age and comorbidities. -Procalcitonin reassuringly low   Essential hypertension -Allow permissive hypertension per neurology over the next 24 hours, ultimate goal normotensive -Hold home BP meds, start as needed hydralazine   IDDM, uncontrolled with hyperglycemia -A1c 11.4 just last month -Patient is currently n.p.o., hold off long-acting insulin -Sliding scale only for now, hypoglycemic protocol ongoing   PVD -Will hold off cilostazol while patient on DAPT of aspirin and Brilinta   CKD stage IIIa -Euvolemic, creatinine level stable   Chronic HFpEF -Euvolemic/not in acute exacerbation -Home blood pressure medications on hold given permissive hypertension as above, follow volume status  DVT prophylaxis: Lovenox Code Status: Full Family Communication: At bedside  Status is: Inpatient  Dispo: The patient is from: Inpatient rehab              Anticipated d/c is to: Same              Anticipated d/c date is: 48 to 72 hours pending clinical course              Patient currently not medically stable for discharge  Consultants:  Neurology  Procedures:  None  Antimicrobials:  Unasyn x 5 days  Subjective: No  acute issues or events overnight denies nausea vomiting diarrhea constipation headache fevers chills or chest pain  Objective: Vitals:   06/12/22 1647 06/12/22 2043 06/13/22 0344  BP: (!) 156/77 133/65 (!) 151/73  Pulse: 61 60   Resp: 16 14   Temp: 98.7 F (37.1  C) 98.2 F (36.8 C)   TempSrc: Oral Axillary   SpO2: 97% 98%     Intake/Output Summary (Last 24 hours) at 06/13/2022 0759 Last data filed at 06/13/2022 0346 Gross per 24 hour  Intake --  Output 650 ml  Net -650 ml   There were no vitals filed for this visit.  Examination:  General:  Pleasantly resting in bed, No acute distress. HEENT:  Normocephalic atraumatic.  Sclerae nonicteric, noninjected.  Extraocular movements intact bilaterally. Neck:  Without mass or deformity.  Trachea is midline. Lungs:  Clear to auscultate bilaterally without rhonchi, wheeze, or rales. Heart:  Regular rate and rhythm.  Without murmurs, rubs, or gallops. Abdomen:  Soft, nontender, nondistended.  Without guarding or rebound. Extremities: Left-sided strength 1 out of 5 distally, 2 out of 5 more proximally.  Right side strength 4 out of 5.  Data Reviewed: I have personally reviewed following labs and imaging studies  CBC: Recent Labs  Lab 06/10/22 1249 06/12/22 1328  WBC 9.3 7.0  NEUTROABS 4.9  --   HGB 11.4* 11.0*  HCT 33.9* 32.9*  MCV 76.7* 78.3*  PLT 281 [id number]   Basic Metabolic Panel: Recent Labs  Lab 06/10/22 1249 06/12/22 1328  NA 137 138  K 4.1 4.1  CL 106 111  CO2 23 21*  GLUCOSE 114* 104*  BUN 28* 24*  CREATININE 1.31* 1.30*  CALCIUM 9.8 9.6   GFR: Estimated Creatinine Clearance: 46.4 mL/min (A) (by C-G formula based on SCr of 1.3 mg/dL (H)). Liver Function Tests: No results for input(s): "AST", "ALT", "ALKPHOS", "BILITOT", "PROT", "ALBUMIN" in the last 168 hours. No results for input(s): "LIPASE", "AMYLASE" in the last 168 hours. No results for input(s): "AMMONIA" in the last 168 hours. Coagulation Profile: No results for input(s): "INR", "PROTIME" in the last 168 hours. Cardiac Enzymes: No results for input(s): "CKTOTAL", "CKMB", "CKMBINDEX", "TROPONINI" in the last 168 hours. BNP (last 3 results) No results for input(s): "PROBNP" in the last 8760 hours. HbA1C: No  results for input(s): "HGBA1C" in the last 72 hours. CBG: Recent Labs  Lab 06/11/22 1139 06/11/22 1622 06/11/22 2105 06/12/22 0613 06/12/22 1129  GLUCAP 185* 207* 161* 102* 128*   Lipid Profile: No results for input(s): "CHOL", "HDL", "LDLCALC", "TRIG", "CHOLHDL", "LDLDIRECT" in the last 72 hours. Thyroid Function Tests: No results for input(s): "TSH", "T4TOTAL", "FREET4", "T3FREE", "THYROIDAB" in the last 72 hours. Anemia Panel: No results for input(s): "VITAMINB12", "FOLATE", "FERRITIN", "TIBC", "IRON", "RETICCTPCT" in the last 72 hours. Sepsis Labs: Recent Labs  Lab 06/12/22 1846  PROCALCITON <0.10    No results found for this or any previous visit (from the past 240 hour(s)).       Radiology Studies: DG Chest 1 View  Result Date: 06/12/2022 CLINICAL DATA:  Pneumonia EXAM: CHEST  1 VIEW COMPARISON:  Chest x-ray 05/29/2022 FINDINGS: Cardiomediastinal silhouette is within normal limits. There is a linear band of atelectasis or scarring in the left mid lung similar to prior. Lungs are otherwise clear. No pleural effusion or pneumothorax. No acute fractures. IMPRESSION: No acute cardiopulmonary process.  No significant interval change. Electronically Signed   By: Ronney Asters M.D.   On: 06/12/2022 16:17   MR BRAIN  WO CONTRAST  Result Date: 06/12/2022 CLINICAL DATA:  Neuro deficit, acute, stroke suspected. New right sided weakness, worsening aphasia. EXAM: MRI HEAD WITHOUT CONTRAST TECHNIQUE: Multiplanar, multiecho pulse sequences of the brain and surrounding structures were obtained without intravenous contrast. COMPARISON:  Head CT 06/11/2022 and MRI/MRA 05/30/2022 FINDINGS: Multiple sequences are up to moderately motion degraded. Brain: The acute infarct in the right ventral medulla on the prior MRI has enlarged and now extends to the left of midline. There is also a new punctate acute infarct in the left cerebellar hemisphere. Small T2 hyperintensities in the cerebral white  matter bilaterally are unchanged and nonspecific but compatible with mild chronic small vessel ischemic disease chronic microhemorrhages are again noted in the right thalamus and right parietal white matter. Small chronic bilateral cerebellar infarcts are again noted with hemosiderin deposition associated with 1 of the left-sided infarcts. The ventricles and sulci are normal. No mass, midline shift, or extra-axial fluid collection is identified. Vascular: Unchanged abnormal appearance of the distal right vertebral artery, more fully evaluated on the prior MRA. Skull and upper cervical spine: No suspicious marrow lesion. Sinuses/Orbits: Unremarkable orbits. Mucous retention cyst in the right maxillary sinus. Small left mastoid effusion. Other: Partially visualized bilateral cervical lymphadenopathy with history of lymphoma. IMPRESSION: 1. Increased size of a recent right medullary infarct which now extends to the left of midline. 2. New punctate acute left cerebellar infarct. 3. Mild chronic small vessel ischemic disease. Chronic cerebellar infarcts. These results will be called to the ordering clinician or representative by the Radiologist Assistant, and communication documented in the PACS or [employer]. Electronically Signed   By: Logan Bores M.D.   On: 06/12/2022 13:24   CT HEAD WO CONTRAST (5MM)  Result Date: 06/11/2022 CLINICAL DATA:  Follow-up examination for stroke, slurred speech. EXAM: CT HEAD WITHOUT CONTRAST TECHNIQUE: Contiguous axial images were obtained from the base of the skull through the vertex without intravenous contrast. RADIATION DOSE REDUCTION: This exam was performed according to the departmental dose-optimization program which includes automated exposure control, adjustment of the mA and/or kV according to patient size and/or use of iterative reconstruction technique. COMPARISON:  Comparison made with prior CT and MRI from 05/30/2022. FINDINGS: Brain: Cerebral volume within normal  limits for age. Mild chronic small vessel ischemic disease noted. Previously identified small medullary infarct not visible by CT. No other acute large vessel territory infarct. No intracranial hemorrhage. No mass lesion or midline shift. No hydrocephalus or extra-axial fluid collection. Vascular: No abnormal hyperdense vessel. Calcified atherosclerosis present about the skull base. Skull: Scalp soft tissues and calvarium within normal limits. Sinuses/Orbits: Globes and orbital soft tissues demonstrate no acute finding. Paranasal sinuses remain largely clear. No mastoid effusion. Other: None. IMPRESSION: 1. No acute intracranial abnormality. 2. Mild chronic small vessel ischemic disease. Previously identified small medullary infarct not visible by CT. Electronically Signed   By: Jeannine Boga M.D.   On: 06/11/2022 21:07    Scheduled Meds:   stroke: early stages of recovery book   Does not apply Once   aspirin  300 mg Rectal Daily   enoxaparin (LOVENOX) injection  40 mg Subcutaneous Q24H   insulin aspart  0-15 Units Subcutaneous TID WC   pantoprazole (PROTONIX) IV  40 mg Intravenous Q24H   ticagrelor  90 mg Oral BID   Continuous Infusions:  sodium chloride 100 mL/hr at 06/13/22 0534   ampicillin-sulbactam (UNASYN) IV 3 g (06/13/22 0535)     LOS: 1 day   Time spent: 54mn  Little Ishikawa, DO [facility]  If 7PM-7AM, please contact night-coverage [email or web address]  06/13/2022, 7:59 AM

## 2022-06-13 NOTE — Discharge Summary (Signed)
Physician Discharge Summary  Patient ID: [name] MRN: [id number] DOB/AGE: Sep 08, 1960 62 y.o.  Admit date: 06/04/2022 Discharge date: 06/13/2022  Discharge Diagnoses:  Principal Problem:   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[place]) Active Problems:   DM (diabetes mellitus), type 2 with peripheral vascular complications ([facility])   Diabetic peripheral neuropathy associated with type 2 diabetes mellitus ([facility])   GERD (gastroesophageal reflux disease)   Stage 3a chronic kidney disease (CKD) (H[facility])   Personal history of CLL (chronic lymphocytic leukemia)   Anxiety disorder due to medical condition   Discharged Condition: serious  Significant Diagnostic Studies: DG Chest 1 View  Result Date: 06/12/2022 CLINICAL DATA:  Pneumonia EXAM: CHEST  1 VIEW COMPARISON:  Chest x-ray 05/29/2022 FINDINGS: Cardiomediastinal silhouette is within normal limits. There is a linear band of atelectasis or scarring in the left mid lung similar to prior. Lungs are otherwise clear. No pleural effusion or pneumothorax. No acute fractures. IMPRESSION: No acute cardiopulmonary process.  No significant interval change. Electronically Signed   By: Ronney Asters M.D.   On: 06/12/2022 16:17   MR BRAIN WO CONTRAST  Result Date: 06/12/2022 CLINICAL DATA:  Neuro deficit, acute, stroke suspected. New right sided weakness, worsening aphasia. EXAM: MRI HEAD WITHOUT CONTRAST TECHNIQUE: Multiplanar, multiecho pulse sequences of the brain and surrounding structures were obtained without intravenous contrast. COMPARISON:  Head CT 06/11/2022 and MRI/MRA 05/30/2022 FINDINGS: Multiple sequences are up to moderately motion degraded. Brain: The acute infarct in the right ventral medulla on the prior MRI has enlarged and now extends to the left of midline. There is also a new punctate acute infarct in the left cerebellar hemisphere. Small T2 hyperintensities in the cerebral white matter bilaterally are unchanged and nonspecific but compatible  with mild chronic small vessel ischemic disease chronic microhemorrhages are again noted in the right thalamus and right parietal white matter. Small chronic bilateral cerebellar infarcts are again noted with hemosiderin deposition associated with 1 of the left-sided infarcts. The ventricles and sulci are normal. No mass, midline shift, or extra-axial fluid collection is identified. Vascular: Unchanged abnormal appearance of the distal right vertebral artery, more fully evaluated on the prior MRA. Skull and upper cervical spine: No suspicious marrow lesion. Sinuses/Orbits: Unremarkable orbits. Mucous retention cyst in the right maxillary sinus. Small left mastoid effusion. Other: Partially visualized bilateral cervical lymphadenopathy with history of lymphoma. IMPRESSION: 1. Increased size of a recent right medullary infarct which now extends to the left of midline. 2. New punctate acute left cerebellar infarct. 3. Mild chronic small vessel ischemic disease. Chronic cerebellar infarcts. These results will be called to the ordering clinician or representative by the Radiologist Assistant, and communication documented in the PACS or [employer]. Electronically Signed   By: Logan Bores M.D.   On: 06/12/2022 13:24   CT HEAD WO CONTRAST (5MM)  Result Date: 06/11/2022 CLINICAL DATA:  Follow-up examination for stroke, slurred speech. EXAM: CT HEAD WITHOUT CONTRAST TECHNIQUE: Contiguous axial images were obtained from the base of the skull through the vertex without intravenous contrast. RADIATION DOSE REDUCTION: This exam was performed according to the departmental dose-optimization program which includes automated exposure control, adjustment of the mA and/or kV according to patient size and/or use of iterative reconstruction technique. COMPARISON:  Comparison made with prior CT and MRI from 05/30/2022. FINDINGS: Brain: Cerebral volume within normal limits for age. Mild chronic small vessel ischemic disease  noted. Previously identified small medullary infarct not visible by CT. No other acute large vessel territory infarct. No intracranial  hemorrhage. No mass lesion or midline shift. No hydrocephalus or extra-axial fluid collection. Vascular: No abnormal hyperdense vessel. Calcified atherosclerosis present about the skull base. Skull: Scalp soft tissues and calvarium within normal limits. Sinuses/Orbits: Globes and orbital soft tissues demonstrate no acute finding. Paranasal sinuses remain largely clear. No mastoid effusion. Other: None. IMPRESSION: 1. No acute intracranial abnormality. 2. Mild chronic small vessel ischemic disease. Previously identified small medullary infarct not visible by CT. Electronically Signed   By: Jeannine Boga M.D.   On: 06/11/2022 21:07      Labs:  Basic Metabolic Panel: Recent Labs  Lab 06/10/22 1249 06/12/22 1328  NA 137 138  K 4.1 4.1  CL 106 111  CO2 23 21*  GLUCOSE 114* 104*  BUN 28* 24*  CREATININE 1.31* 1.30*  CALCIUM 9.8 9.6    CBC: Recent Labs  Lab 06/10/22 1249 06/12/22 1328  WBC 9.3 7.0  NEUTROABS 4.9  --   HGB 11.4* 11.0*  HCT 33.9* 32.9*  MCV 76.7* 78.3*  PLT 281 244    CBG:   Brief HPI:   [name] is a 62 y.o. female ***   Hospital Course: [name] was admitted to rehab 06/04/2022 for inpatient therapies to consist of PT, ST and OT at least three hours five days a week. Past admission physiatrist, therapy team and rehab RN have worked together to provide customized collaborative inpatient rehab.  During patient's stay in rehab weekly team conferences were held to monitor patient's progress, set goals and discuss barriers to discharge. At admission, patient required   Blood pressures were monitored on TID basis and   Diabetes has been monitored with ac/hs CBG checks and SSI was use prn for tighter BS control. Her intake has been good and BS were trending up therefore insulin glargine was titrated up to 12 units  BID for tighter control.   She did report     Medications at discharge:  Metoprolol 25 mg po bid Oxycodone 5 mg every 4 hours prn Protonix 40 mg po daily Miralax 17 gram Pletal 50 mg po bid Insulin glargine 12 units BID.  Novolog 6 units TID ac Hydralazine 50 mg po TID Proazac 20 mg po daily Lovenox 40 mg daily Zetia 10 mg po daily Plavix 75 mg po daily Lipitor 80 mg po daily Norvasc 10 mg po daily 15. Xanax 0.25 mg po tid prn  16. Normal saline 50 cc/hr 6 pm- 6 am  Diet: NPO  Discharge disposition: [place] Not Defined      Follow-up Information     [name], [name], MD Follow up.   Specialty: Family Medicine Why: Call in 1-2 days for post hospital follow up Contact information: [place] [address] [phone]         Charlett Blake, MD Follow up.   Specialty: Physical Medicine and Rehabilitation Why: office will call you with follow up appointment Contact information: [place] [address] [phone]         [facility] Follow up.   Why: office will call you with follow up appointment Contact information: [address]     [place] [phone]        Lorretta Harp, MD Follow up.   Specialties: Cardiology, Radiology Why: for management of lipids Contact information: [address] [place] [address] [phone]  Signed: Bary Leriche 06/13/2022, 9:11 AM

## 2022-06-13 NOTE — Progress Notes (Signed)
Physical Therapy Discharge Patient Details Name: [name] MRN: [id number] DOB: Mar 22, 1961 Date Discharged from therapy services: 06/12/2022  Physical Therapy Discharge Note  This patient was unable to complete the inpatient rehab program due to progression of right medulla stroke crossing midline to the left side; therefore did not meet their long term goals. Pt left the program at a Mod/ Max assist level for their functional mobility/ transfers. This patient is being discharged from PT services at this time.  Pt's perception of pain in the last five days was: unable to answer at this time.    See [employer] for functional status details  If the patient is able to return to inpatient rehabilitation within 3 midnights, this may be considered an interrupted stay and therapy services will resume as ordered. Modification and reinstatement of their goals will be made upon completion of therapy service reevaluations.

## 2022-06-13 NOTE — Evaluation (Signed)
Occupational Therapy Evaluation Patient Details Name: [name] MRN: [id number] DOB: 1960-06-02 Today's Date: 06/13/2022   History of Present Illness [name] is a 62 y.o. female found to have new onset of right-sided weakness and worsening of dysarthria and drooling at [facility] hospital inpatient rehab unit on 06/12/22.  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 Patient transferred to [facility] acute care.  Medical history significant of multiple strokes in 2022, 2023 and 2024, HTN, HLD, IDDM, PVD, non-Hodgkin's lymphoma, sickle cell trait, CKD stage IIIa   Clinical Impression   Prior to this admission, patient up on AIR receiving rehab prior to stroke extending. Currently, patient is a max to total A of 2 to complete bed mobility, and requires frequent to constant external assist in order to maintain sitting balance. Prior to this stroke, patient was standing with RW with moderate assist and performing squat-pivot transfers with min-mod assist. OT highly recommending return to AIR as patient has family support, decrease caregiver burden, and would benefit from family training and education due to current need for significant assistance to engage in ADLs and all functional mobility.      Recommendations for follow up therapy are one component of a multi-disciplinary discharge planning process, led by the attending physician.  Recommendations may be updated based on patient status, additional functional criteria and insurance authorization.   Follow Up Recommendations  Acute inpatient rehab (3hours/day)     Assistance Recommended at Discharge Frequent or constant Supervision/Assistance  Patient can return home with the following Two people to help with walking and/or transfers;A lot of help with bathing/dressing/bathroom;Assistance with cooking/housework;Direct supervision/assist for medications management;Assist for transportation;Help with stairs  or ramp for entrance;Direct supervision/assist for financial management    Functional Status Assessment  Patient has had a recent decline in their functional status and demonstrates the ability to make significant improvements in function in a reasonable and predictable amount of time.  Equipment Recommendations  Other (comment) (Defer to next venue)    Recommendations for Other Services       Precautions / Restrictions Precautions Precautions: Fall Restrictions Weight Bearing Restrictions: No      Mobility Bed Mobility Overal bed mobility: Needs Assistance Bed Mobility: Supine to Sit, Sit to Supine, Rolling Rolling: Total assist   Supine to sit: Total assist, +2 for physical assistance, HOB elevated Sit to supine: Total assist, +2 for physical assistance   General bed mobility comments: helicopter technique to sit on left EOB; pt able to assist with moving legs toward EOB to prepare to sit up    Transfers                   General transfer comment: unsafe to attempt due to degree of trunk and extremity weakness      Balance Overall balance assessment: Needs assistance Sitting-balance support: Single extremity supported, Feet supported Sitting balance-Leahy Scale: Zero Sitting balance - Comments: losing balance primarily posterior and  right       Standing balance comment: unable to attempt                           ADL either performed or assessed with clinical judgement   ADL Overall ADL's : Needs assistance/impaired Eating/Feeding: Maximal assistance   Grooming: Moderate assistance;Bed level   Upper Body Bathing: Maximal assistance;Bed level   Lower Body Bathing: Total assistance;Bed level   Upper Body Dressing : Maximal assistance;Bed level  Lower Body Dressing: Total assistance;Bed level   Toilet Transfer: Total assistance;+2 for physical assistance;+2 for safety/equipment   Toileting- Clothing Manipulation and Hygiene: Total  assistance;Bed level;+2 for physical assistance;+2 for safety/equipment       Functional mobility during ADLs: Maximal assistance;Total assistance;+2 for physical assistance;+2 for safety/equipment General ADL Comments: Patient presenting with need for significant assist to complete ADLs, and frequent to constant external assist to maintain sittting balance sitting EOB     Vision Baseline Vision/History: 0 No visual deficits Ability to See in Adequate Light: 0 Adequate Patient Visual Report: No change from baseline       Perception     Praxis      Pertinent Vitals/Pain Pain Assessment Pain Assessment: 0-10 Pain Score: 5  Pain Location: lower back Pain Descriptors / Indicators: Aching, Discomfort Pain Intervention(s): Limited activity within patient's tolerance, Monitored during session, Repositioned     Hand Dominance Right   Extremity/Trunk Assessment Upper Extremity Assessment Upper Extremity Assessment: RUE deficits/detail;LUE deficits/detail RUE Deficits / Details: decreased strength, but functional RUE Sensation: WNL RUE Coordination: decreased fine motor;decreased gross motor LUE Deficits / Details: Decreased strength in shoulder, elbow wrist and hand approximately 3-/5, also noted to have flexor tone when transitioning to seated position LUE Sensation: WNL LUE Coordination: decreased fine motor;decreased gross motor   Lower Extremity Assessment Lower Extremity Assessment: Defer to PT evaluation RLE Deficits / Details: hip flex 1+, hip extension 2, knee extension 0, ankle DF 0 RLE Sensation: decreased light touch LLE Deficits / Details: hip flexion 3+, hip extension 3+, knee extension 3+, ankle DF 0, PF 2+ LLE Sensation: decreased light touch   Cervical / Trunk Assessment Cervical / Trunk Assessment: Other exceptions Cervical / Trunk Exceptions: pt with bil trunk weakness in sitting with very slouched posture with rounded shoulders and forward, flexed head; able  to initiate trunk extension with scapular retraction and cervical extension   Communication Communication Communication: Expressive difficulties   Cognition Arousal/Alertness: Awake/alert Behavior During Therapy: WFL for tasks assessed/performed Overall Cognitive Status: Impaired/Different from baseline Area of Impairment: Problem solving                             Problem Solving: Slow processing, Decreased initiation, Requires verbal cues, Requires tactile cues, Difficulty sequencing General Comments: required incr time for all movements she was asked to perform (Rt slightly longer time compared to left)     General Comments       Exercises     Shoulder Instructions      Home Living Family/patient expects to be discharged to:: Private residence Living Arrangements: Alone Available Help at Discharge: Family Type of Home: Apartment Home Access: Stairs to enter Technical brewer of Steps: 3 Entrance Stairs-Rails: None Home Layout: One level     Bathroom Shower/Tub: Teacher, early years/pre: Standard Bathroom Accessibility: Yes   Home Equipment: Conservation officer, nature (2 wheels)          Prior Functioning/Environment Prior Level of Function : Independent/Modified Independent             Mobility Comments: walked with a cane prior to initial R medullary stroke          OT Problem List: Decreased activity tolerance;Impaired balance (sitting and/or standing);Decreased coordination;Decreased safety awareness;Decreased knowledge of precautions;Decreased knowledge of use of DME or AE;Impaired UE functional use      OT Treatment/Interventions: Self-care/ADL training;Energy conservation;Therapeutic exercise;DME and/or AE instruction;Therapeutic activities;Patient/family education;Balance training  OT Goals(Current goals can be found in the care plan section) Acute Rehab OT Goals Patient Stated Goal: to get back up to rehab OT Goal  Formulation: With patient Time For Goal Achievement: 06/27/22 Potential to Achieve Goals: Fair ADL Goals Pt Will Perform Lower Body Bathing: sitting/lateral leans;with mod assist Pt Will Perform Lower Body Dressing: with mod assist;sitting/lateral leans Pt Will Transfer to Toilet: with mod assist;stand pivot transfer;bedside commode Pt Will Perform Toileting - Clothing Manipulation and hygiene: with mod assist;sitting/lateral leans;sit to/from stand Additional ADL Goal #1: Patient will be able to sit EOB for 5 minutes while completing functional ADL task with min A only to correct posture.  OT Frequency: Min 2X/week    Co-evaluation   Reason for Co-Treatment: For patient/therapist safety;To address functional/ADL transfers PT goals addressed during session: Mobility/safety with mobility;Balance;Strengthening/ROM OT goals addressed during session: ADL's and self-care      AM-PAC OT "6 Clicks" Daily Activity     Outcome Measure Help from another person eating meals?: A Lot Help from another person taking care of personal grooming?: A Lot Help from another person toileting, which includes using toliet, bedpan, or urinal?: Total Help from another person bathing (including washing, rinsing, drying)?: A Lot Help from another person to put on and taking off regular upper body clothing?: A Lot Help from another person to put on and taking off regular lower body clothing?: Total 6 Click Score: 10   End of Session Nurse Communication: Mobility status  Activity Tolerance: Patient limited by fatigue;Patient limited by lethargy Patient left: in bed;with call bell/phone within reach;with bed alarm set;with family/visitor present  OT Visit Diagnosis: Unsteadiness on feet (R26.81);Other abnormalities of gait and mobility (R26.89);Muscle weakness (generalized) (M62.81)                Time: [id number] OT Time Calculation (min): 26 min Charges:  OT General Charges $OT Visit: 1 Visit OT  Evaluation $OT Eval Moderate Complexity: 1 Mod  Corinne Ports E. Damean Poffenberger, OTR/L Acute Rehabilitation Services [phone]   Ascencion Dike 06/13/2022, 3:49 PM

## 2022-06-13 NOTE — Plan of Care (Signed)
Problem: Education: Goal: Knowledge of General Education information will improve Description: Including pain rating scale, medication(s)/side effects and non-pharmacologic comfort measures Outcome: Progressing   Problem: Health Behavior/Discharge Planning: Goal: Ability to manage health-related needs will improve Outcome: Progressing   Problem: Clinical Measurements: Goal: Ability to maintain clinical measurements within normal limits will improve Outcome: Progressing Goal: Will remain free from infection Outcome: Progressing Goal: Diagnostic test results will improve Outcome: Progressing Goal: Respiratory complications will improve Outcome: Progressing Goal: Cardiovascular complication will be avoided Outcome: Progressing   Problem: Activity: Goal: Risk for activity intolerance will decrease Outcome: Progressing   Problem: Nutrition: Goal: Adequate nutrition will be maintained Outcome: Progressing   Problem: Coping: Goal: Level of anxiety will decrease Outcome: Progressing   Problem: Elimination: Goal: Will not experience complications related to bowel motility Outcome: Progressing Goal: Will not experience complications related to urinary retention Outcome: Progressing   Problem: Pain Managment: Goal: General experience of comfort will improve Outcome: Progressing   Problem: Safety: Goal: Ability to remain free from injury will improve Outcome: Progressing   Problem: Skin Integrity: Goal: Risk for impaired skin integrity will decrease Outcome: Progressing   Problem: Education: Goal: Ability to describe self-care measures that may prevent or decrease complications (Diabetes Survival Skills Education) will improve Outcome: Progressing Goal: Individualized Educational Video(s) Outcome: Progressing   Problem: Coping: Goal: Ability to adjust to condition or change in health will improve Outcome: Progressing   Problem: Fluid Volume: Goal: Ability to  maintain a balanced intake and output will improve Outcome: Progressing   Problem: Health Behavior/Discharge Planning: Goal: Ability to identify and utilize available resources and services will improve Outcome: Progressing Goal: Ability to manage health-related needs will improve Outcome: Progressing   Problem: Metabolic: Goal: Ability to maintain appropriate glucose levels will improve Outcome: Progressing   Problem: Nutritional: Goal: Maintenance of adequate nutrition will improve Outcome: Progressing Goal: Progress toward achieving an optimal weight will improve Outcome: Progressing   Problem: Skin Integrity: Goal: Risk for impaired skin integrity will decrease Outcome: Progressing   Problem: Tissue Perfusion: Goal: Adequacy of tissue perfusion will improve Outcome: Progressing   Problem: Education: Goal: Knowledge of disease or condition will improve Outcome: Progressing Goal: Knowledge of secondary prevention will improve (MUST DOCUMENT ALL) Outcome: Progressing Goal: Knowledge of patient specific risk factors will improve [place] N/A or DELETE if not current risk factor) Outcome: Progressing   Problem: Ischemic Stroke/TIA Tissue Perfusion: Goal: Complications of ischemic stroke/TIA will be minimized Outcome: Progressing   Problem: Coping: Goal: Will verbalize positive feelings about self Outcome: Progressing Goal: Will identify appropriate support needs Outcome: Progressing   Problem: Health Behavior/Discharge Planning: Goal: Ability to manage health-related needs will improve Outcome: Progressing Goal: Goals will be collaboratively established with patient/family Outcome: Progressing   Problem: Self-Care: Goal: Ability to participate in self-care as condition permits will improve Outcome: Progressing Goal: Verbalization of feelings and concerns over difficulty with self-care will improve Outcome: Progressing Goal: Ability to communicate needs accurately  will improve Outcome: Progressing   Problem: Nutrition: Goal: Risk of aspiration will decrease Outcome: Progressing Goal: Dietary intake will improve Outcome: Progressing   Problem: Education: Goal: Knowledge of disease or condition will improve Outcome: Progressing Goal: Knowledge of secondary prevention will improve (MUST DOCUMENT ALL) Outcome: Progressing Goal: Knowledge of patient specific risk factors will improve [place] N/A or DELETE if not current risk factor) Outcome: Progressing   Problem: Ischemic Stroke/TIA Tissue Perfusion: Goal: Complications of ischemic stroke/TIA will be minimized Outcome: Progressing   Problem: Coping: Goal: Will  verbalize positive feelings about self Outcome: Progressing Goal: Will identify appropriate support needs Outcome: Progressing   Problem: Health Behavior/Discharge Planning: Goal: Ability to manage health-related needs will improve Outcome: Progressing Goal: Goals will be collaboratively established with patient/family Outcome: Progressing

## 2022-06-14 DIAGNOSIS — J690 Pneumonitis due to inhalation of food and vomit: Secondary | ICD-10-CM

## 2022-06-14 DIAGNOSIS — R471 Dysarthria and anarthria: Secondary | ICD-10-CM | POA: Diagnosis not present

## 2022-06-14 DIAGNOSIS — I639 Cerebral infarction, unspecified: Secondary | ICD-10-CM | POA: Diagnosis not present

## 2022-06-14 LAB — GLUCOSE, CAPILLARY
Glucose-Capillary: 169 mg/dL — ABNORMAL HIGH (ref 70–99)
Glucose-Capillary: 164 mg/dL — ABNORMAL HIGH (ref 70–99)
Glucose-Capillary: 178 mg/dL — ABNORMAL HIGH (ref 70–99)
Glucose-Capillary: 147 mg/dL — ABNORMAL HIGH (ref 70–99)

## 2022-06-14 MED ORDER — ASPIRIN 81 MG PO TBEC
81.0000 mg | DELAYED_RELEASE_TABLET | Freq: Every day | ORAL | Status: DC
  Administered 2022-06-14 – 2022-06-21 (×8): 81 mg via ORAL
  Filled 2022-06-14 (×8): qty 1

## 2022-06-14 NOTE — Progress Notes (Signed)
STROKE TEAM PROGRESS NOTE   INTERVAL HISTORY Her family member is at the bedside.  She is doing somewhat better and passed swallow eval and is now on a diet.  Therapy eval still recommends inpatient rehab.  Vital signs stable.  Neurological exam unchanged Patient is interested in considering possible participation in the Erhard stroke trial Vitals:   06/13/22 2313 06/14/22 0339 06/14/22 0736 06/14/22 0805  BP: (!) 195/81 (!) 197/83 (!) 199/93 (!) 182/91  Pulse: 70 62 76 76  Resp: '15 16 15 16  '$ Temp: 98.3 F (36.8 C) 98.2 F (36.8 C) 98.5 F (36.9 C) 98.4 F (36.9 C)  TempSrc: Axillary Axillary Oral Oral  SpO2: 96% 99% 99% 100%  Weight:  83.2 kg     CBC:  Recent Labs  Lab 06/10/22 1249 06/12/22 1328 06/13/22 0720  WBC 9.3 7.0 6.1  NEUTROABS 4.9  --   --   HGB 11.4* 11.0* 10.6*  HCT 33.9* 32.9* 32.4*  MCV 76.7* 78.3* 78.6*  PLT 281 244 093   Basic Metabolic Panel:  Recent Labs  Lab 06/12/22 1328 06/13/22 0720  NA 138 142  K 4.1 4.1  CL 111 113*  CO2 21* 20*  GLUCOSE 104* 106*  BUN 24* 20  CREATININE 1.30* 1.22*  CALCIUM 9.6 9.5   Lipid Panel:  Recent Labs  Lab 06/13/22 0720  CHOL 126  TRIG 84  HDL 28*  CHOLHDL 4.5  VLDL 17  LDLCALC 81   HgbA1c: No results for input(s): "HGBA1C" in the last 168 hours. Urine Drug Screen: No results for input(s): "LABOPIA", "COCAINSCRNUR", "LABBENZ", "AMPHETMU", "THCU", "LABBARB" in the last 168 hours.  Alcohol Level No results for input(s): "ETH" in the last 168 hours.  IMAGING past 24 hours No results found.  PHYSICAL EXAM General - Well nourished, well developed pleasant middle-aged African-American lady, in mild distress due to dyspnea. Respiratory: Regular, unlabored respirations  Mental Status - Level of arousal and orientation to time, place, and person were intact. Language naming, repetition, comprehension are intact.  Severely dysarthric. Attention span and concentration were normal. Cranial Nerves II - XII  - II - Visual field intact OU. III, IV, VI - Extraocular movements intact. V - Facial sensation intact bilaterally. VII - Facial movement intact bilaterally. VIII - Hearing & vestibular intact bilaterally. X - Palate elevates symmetrically. XI - shoulder shrug absent on left and weak on right XII - Tongue protrusion intact.   Motor Strength -1 out of 5 strength to left upper extremity, 3 out of 5 strength to right upper extremity, 2 out of 5 strength to right lower extremity, 1 out of 5 strength to left lower extremity Motor Tone - Muscle tone was assessed at the neck and was noted to be normal at the neck and decreased elsewhere.   Sensory - Light touch was assessed and found to be symmetrical.     Coordination -unable to perform   Gait and Station - deferred.   ASSESSMENT/PLAN [name] is a 62 y.o. female with history of diabetes, GBS in 1988, hypertension, several strokes, CKD, non-Hodgkin's lymphoma and questionable history of possible prior GBS who was originally admitted on 1/28 with dizziness diplopia and unusual sensations through her body. Yesterday morning, noted to be different from her clinical condition of yesterday with worsening right-sided weakness, difficulty breathing and more slurred speech than baseline. Repeat MRI demonstrates extension of known right medullary stroke to now and all the left side.  Patient is noted to have labored  respirations with visible accessory muscle use and states that she is having a hard time breathing.  Stroke:  Acute left cerebellar infarct and extension of the right medullary infarct to the left Etiology:  severe posterior circulation narrowing of bilateral terminal vertebral arteries 2/11- MRI Increased size of a recent right medullary infarct which now extends to the left of midline. New punctate acute left cerebellar infarct MRA  Head-1. Diminutive enhancement of the right V4 segment with intermittent occlusion, and multifocal  severe stenosis/near occlusion of the left V4 segment, similar to the prior CTA. Additional intracranial atherosclerotic disease as detailed above resulting in moderate stenosis of the left cavernous ICA, mild-to-moderate stenosis of the right supraclinoid ICA, severe stenosis of the origin of the left superior M2 division, severe stenosis of the distal right M1 segment, and severe stenosis of the proximal left P1 segment.  MRA Neck- 1. 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 Moderate stenosis of the origin of the left vertebral artery, not significantly changed. 2D Echo EF 60-65% LDL 123 HgbA1c 11.4 VTE prophylaxis -lovenox    Diet   Diet Carb Modified Fluid consistency: Thin; Room service appropriate? Yes with Assist   clopidogrel 75 mg daily and pletal '50mg'$  BID prior to admission, now on aspirin 81 mg daily and Brilinta (ticagrelor) 90 mg bid. X 3 months followed by aspirin alone Therapy recommendations:  pending Disposition:  pending  Hypertension Home meds:  coozar, norvasc  Stable Permissive hypertension (OK if < 220/120) but gradually normalize in 5-7 days Long-term BP goal normotensive  Hyperlipidemia Home meds:  atorvastatin '80mg'$ , resumed in hospital LDL 123, goal < 70 Continue statin at discharge  Diabetes type II Uncontrolled Home meds:  trulicity  [id number] 50.5, goal < 7.0 CBGs Recent Labs    06/13/22 1616 06/13/22 2114 06/14/22 0612  GLUCAP 102* 216* 169*    SSI  Other Stroke Risk Factors Hx stroke/TIA Previous stroke- 1/29 Right medullary stroke  09/17/2021 Bilateral cerebellum punctate infarcts, likely secondary to large vessel disease source from bilateral VA occlusion  02/08/2021 Two acute/early subacute infarcts within the left pons measuring up to 8 mm.  Other Active Problems History of lymphoma Diabetic neuropathy  Hospital day # 2    Patient presented with right medullary infarct from symptomatic  terminal right vertebral artery occlusion/stenosis 2 weeks ago and was inpatient rehab but had unfortunate neurological worsening and now shows additional left medullary infarct with symptomatic high-grade terminal left vertebral stenosis.  She remains at risk for neurological worsening needs aggressive medical management with dual antiplatelet therapy aspirin and Brilinta for 3 months if she can afford it followed by aspirin alone otherwise may consider switching to aspirin and Plavix after 4 weeks.  Strict control of hypertension and blood pressure goal below 130/90, lipids with LDL cholesterol goal below 70 mg percent and diabetes with hemoglobin A1c goal below 6.5%.  Continue physical occupational therapy and speech therapy and transfer back to rehab when stable in the next few days.  Patient appears interested in participation in the [employer]TIVA stroke prevention study and I will give information to patient and sister to review and decide.  Greater than 50% time during this 35-minute visit was spent in counseling and coordination of care and discussion with patient and care team and sister and answering questions.  Antony Contras, MD Medical Director [facility] Pager: [phone] 06/14/2022 11:08 AM   To contact Stroke Continuity provider, please refer to [email or web address] After  hours, contact General Neurology

## 2022-06-14 NOTE — Progress Notes (Signed)
  Transition of Care [facility]) Screening Note   Patient Details  Name: [name] Date of Birth: [birth year]   [facility]) CM/SW Contact:    Pollie Friar, RN Phone Number: 06/14/2022, 3:16 PM   Pt is from home but came to acute care from [facility]. They are following for readmission but she has not shown level of participation needed at this point. Pt's family is hoping for a return to CIR. CM did provide pts daughter with Brilinta coupon.  Transition of Care Department [facility]) has reviewed patient. We will continue to monitor patient advancement through interdisciplinary progression rounds. If new patient transition needs arise, please place a TOC consult.

## 2022-06-14 NOTE — Progress Notes (Signed)
PROGRESS NOTE    [name]  [id number] DOB: 25-May-1960 DOA: 06/12/2022 PCP: [name], Lisa G, MD   Brief Narrative:  [name] is a 62 y.o. female with medical history significant of multiple strokes in 2022, 2023 and 2024, HTN, HLD, IDDM, PVD, non-Hodgkin's lymphoma, sickle cell trait, CKD stage IIIa, was found to have new onset of right-sided weakness and worsening of dysarthria and drooling at [facility] hospital inpatient rehab unit.   Patient admitted 2 weeks ago secondary to new onset of right medulla stroke,subsequently discharged to inpt rehab - L sided weakness/dysarthria noted at time of discharge. Staff at rehab on 06/12/22 noted seemingly new weakness of right arm and hand, when patient was unable to hold anything on her right hand as well as worsening of slurred speech as well as new left-sided drooling.  Neurology consulted, given that exact onset of her symptoms is unknown, decision made to not give TNK.  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 Admitted to hospitalist service with neurology consult.  Now improving - taking PO safely and improving strength - medically stable for discharge. Awaiting PT/OT and inpatient rehab to re-evaluate for readmission - otherwise will need to go to SNF.  Assessment & Plan:   Principal Problem:   Stroke [facility]) Active Problems:   Acute CVA (cerebrovascular accident) ([place])   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[place])   Dysarthria   Aspiration pneumonia ([place])   Acute extension of previous stroke, with new onset of right-sided paresis, dysphagia and facial droop -MRI confirms "Increased size of a recent right medullary infarct which now extends to the left of midline.New punctate acute left cerebellar infarct." -Neurology following, recommending aspirin and Brilinta, p.o. status remains tentative, unable to have Brilinta over the past 24 hours although rectal aspirin had been  administered. -Permissive hypertension per neurology goal less than 200/110 -PT, OT, speech following - passed swallow exam - Strength improving - dispo pending further eval - likely to go back to CIR vs DC to SNF. -Long-term prognosis poor given recurrent strokes in last 2 years with 2 new successive strokes in 1 month despite appropriate medical treatment.   Probable aspiration pneumonitis Pneumonia less likely but cannot be ruled out -Secondary to above presumed worsening dysphagia - Given acuity unlikely to be infectious rather than reactive inflammatory/pneumonitis - covering with 5 days of unasyn is not unreasonable though given her risk for ongoing aspiration, age and comorbidities. -Procalcitonin reassuringly low   Essential hypertension -Allow permissive hypertension per neurology over the next 24 hours, ultimate goal normotensive -Hold home BP meds, start as needed hydralazine   IDDM, uncontrolled with hyperglycemia -A1c 11.4 just last month -Patient is currently n.p.o., hold off long-acting insulin -Sliding scale only for now, hypoglycemic protocol ongoing   PVD -Will hold off cilostazol while patient on DAPT of aspirin and Brilinta   CKD stage IIIa -Euvolemic, creatinine level stable   Chronic HFpEF -Euvolemic/not in acute exacerbation -Home blood pressure medications on hold given permissive hypertension as above, follow volume status  DVT prophylaxis: Lovenox Code Status: Full Family Communication: At bedside  Status is: Inpatient  Dispo: The patient is from: Inpatient rehab              Anticipated d/c is to: Same              Anticipated d/c date is: 48 to 72 hours pending clinical course  Patient currently not medically stable for discharge  Consultants:  Neurology  Procedures:  None  Antimicrobials:  Unasyn x 5 days  Subjective: No acute issues or events overnight denies nausea vomiting diarrhea constipation headache fevers chills or  chest pain  Objective: Vitals:   06/13/22 2313 06/14/22 0339 06/14/22 0736 06/14/22 0805  BP: (!) 195/81 (!) 197/83 (!) 199/93 (!) 182/91  Pulse: 70 62 76 76  Resp: 15 16 15 16  $ Temp: 98.3 F (36.8 C) 98.2 F (36.8 C) 98.5 F (36.9 C) 98.4 F (36.9 C)  TempSrc: Axillary Axillary Oral Oral  SpO2: 96% 99% 99% 100%  Weight:  83.2 kg      Intake/Output Summary (Last 24 hours) at 06/14/2022 1007 Last data filed at 06/13/2022 2315 Gross per 24 hour  Intake 2224.75 ml  Output 550 ml  Net 1674.75 ml    Filed Weights   06/14/22 0339  Weight: 83.2 kg    Examination:  General:  Pleasantly resting in bed, No acute distress. HEENT:  Normocephalic atraumatic.  Sclerae nonicteric, noninjected.  Extraocular movements intact bilaterally. Neck:  Without mass or deformity.  Trachea is midline. Lungs:  Clear to auscultate bilaterally without rhonchi, wheeze, or rales. Heart:  Regular rate and rhythm.  Without murmurs, rubs, or gallops. Abdomen:  Soft, nontender, nondistended.  Without guarding or rebound. Extremities: Left-sided strength 1 out of 5 distally, 2 out of 5 more proximally.  Right side strength 4 out of 5.  Data Reviewed: I have personally reviewed following labs and imaging studies  CBC: Recent Labs  Lab 06/10/22 1249 06/12/22 1328 06/13/22 0720  WBC 9.3 7.0 6.1  NEUTROABS 4.9  --   --   HGB 11.4* 11.0* 10.6*  HCT 33.9* 32.9* 32.4*  MCV 76.7* 78.3* 78.6*  PLT 281 244 [id number]    Basic Metabolic Panel: Recent Labs  Lab 06/10/22 1249 06/12/22 1328 06/13/22 0720  NA 137 138 142  K 4.1 4.1 4.1  CL 106 111 113*  CO2 23 21* 20*  GLUCOSE 114* 104* 106*  BUN 28* 24* 20  CREATININE 1.31* 1.30* 1.22*  CALCIUM 9.8 9.6 9.5    GFR: Estimated Creatinine Clearance: 49.3 mL/min (A) (by C-Mata formula based on SCr of 1.22 mg/dL (H)). Liver Function Tests: No results for input(s): "AST", "ALT", "ALKPHOS", "BILITOT", "PROT", "ALBUMIN" in the last 168 hours. No results for  input(s): "LIPASE", "AMYLASE" in the last 168 hours. No results for input(s): "AMMONIA" in the last 168 hours. Coagulation Profile: No results for input(s): "INR", "PROTIME" in the last 168 hours. Cardiac Enzymes: No results for input(s): "CKTOTAL", "CKMB", "CKMBINDEX", "TROPONINI" in the last 168 hours. BNP (last 3 results) No results for input(s): "PROBNP" in the last 8760 hours. HbA1C: No results for input(s): "HGBA1C" in the last 72 hours. CBG: Recent Labs  Lab 06/13/22 0840 06/13/22 1106 06/13/22 1616 06/13/22 2114 06/14/22 0612  GLUCAP 99 123* 102* 216* 169*    Lipid Profile: Recent Labs    06/13/22 0720  CHOL 126  HDL 28*  LDLCALC 81  TRIG 84  CHOLHDL 4.5   Thyroid Function Tests: No results for input(s): "TSH", "T4TOTAL", "FREET4", "T3FREE", "THYROIDAB" in the last 72 hours. Anemia Panel: No results for input(s): "VITAMINB12", "FOLATE", "FERRITIN", "TIBC", "IRON", "RETICCTPCT" in the last 72 hours. Sepsis Labs: Recent Labs  Lab 06/12/22 1846 06/13/22 0720  PROCALCITON <0.10 <0.10     No results found for this or any previous visit (from the past 240 hour(s)).  Radiology Studies: DG Chest 1 View  Result Date: 06/13/2022 CLINICAL DATA:  Aspiration pneumonia EXAM: CHEST  1 VIEW COMPARISON:  Radiograph 06/12/2022 FINDINGS: Unchanged cardiomediastinal silhouette. Bandlike atelectasis in the lower lungs. No new airspace disease. No pleural effusion or evidence of pneumothorax. Bones are unchanged. IMPRESSION: No new airspace disease. Electronically Signed   By: Maurine Simmering M.D.   On: 06/13/2022 07:58   DG Chest 1 View  Result Date: 06/12/2022 CLINICAL DATA:  Pneumonia EXAM: CHEST  1 VIEW COMPARISON:  Chest x-ray 05/29/2022 FINDINGS: Cardiomediastinal silhouette is within normal limits. There is a linear band of atelectasis or scarring in the left mid lung similar to prior. Lungs are otherwise clear. No pleural effusion or pneumothorax. No acute  fractures. IMPRESSION: No acute cardiopulmonary process.  No significant interval change. Electronically Signed   By: Ronney Asters M.D.   On: 06/12/2022 16:17   MR BRAIN WO CONTRAST  Result Date: 06/12/2022 CLINICAL DATA:  Neuro deficit, acute, stroke suspected. New right sided weakness, worsening aphasia. EXAM: MRI HEAD WITHOUT CONTRAST TECHNIQUE: Multiplanar, multiecho pulse sequences of the brain and surrounding structures were obtained without intravenous contrast. COMPARISON:  Head CT 06/11/2022 and MRI/MRA 05/30/2022 FINDINGS: Multiple sequences are up to moderately motion degraded. Brain: The acute infarct in the right ventral medulla on the prior MRI has enlarged and now extends to the left of midline. There is also a new punctate acute infarct in the left cerebellar hemisphere. Small T2 hyperintensities in the cerebral white matter bilaterally are unchanged and nonspecific but compatible with mild chronic small vessel ischemic disease chronic microhemorrhages are again noted in the right thalamus and right parietal white matter. Small chronic bilateral cerebellar infarcts are again noted with hemosiderin deposition associated with 1 of the left-sided infarcts. The ventricles and sulci are normal. No mass, midline shift, or extra-axial fluid collection is identified. Vascular: Unchanged abnormal appearance of the distal right vertebral artery, more fully evaluated on the prior MRA. Skull and upper cervical spine: No suspicious marrow lesion. Sinuses/Orbits: Unremarkable orbits. Mucous retention cyst in the right maxillary sinus. Small left mastoid effusion. Other: Partially visualized bilateral cervical lymphadenopathy with history of lymphoma. IMPRESSION: 1. Increased size of a recent right medullary infarct which now extends to the left of midline. 2. New punctate acute left cerebellar infarct. 3. Mild chronic small vessel ischemic disease. Chronic cerebellar infarcts. These results will be called to  the ordering clinician or representative by the Radiologist Assistant, and communication documented in the PACS or [employer]. Electronically Signed   By: Logan Bores M.D.   On: 06/12/2022 13:24    Scheduled Meds:  aspirin EC  81 mg Oral Daily   enoxaparin (LOVENOX) injection  40 mg Subcutaneous Q24H   insulin aspart  0-15 Units Subcutaneous TID WC   pantoprazole  40 mg Oral QPM   ticagrelor  90 mg Oral BID   Continuous Infusions:  ampicillin-sulbactam (UNASYN) IV 3 Mata (06/14/22 0626)     LOS: 2 days   Time spent: 50mn  [name] C Paola Flynt, DO [facility]  If 7PM-7AM, please contact night-coverage [email or web address]  06/14/2022, 10:07 AM

## 2022-06-14 NOTE — Plan of Care (Signed)
Problem: Education: Goal: Knowledge of General Education information will improve Description: Including pain rating scale, medication(s)/side effects and non-pharmacologic comfort measures Outcome: Progressing   Problem: Health Behavior/Discharge Planning: Goal: Ability to manage health-related needs will improve Outcome: Progressing   Problem: Clinical Measurements: Goal: Ability to maintain clinical measurements within normal limits will improve Outcome: Progressing Goal: Will remain free from infection Outcome: Progressing Goal: Diagnostic test results will improve Outcome: Progressing Goal: Respiratory complications will improve Outcome: Progressing Goal: Cardiovascular complication will be avoided Outcome: Progressing   Problem: Activity: Goal: Risk for activity intolerance will decrease Outcome: Progressing   Problem: Nutrition: Goal: Adequate nutrition will be maintained Outcome: Progressing   Problem: Coping: Goal: Level of anxiety will decrease Outcome: Progressing   Problem: Elimination: Goal: Will not experience complications related to bowel motility Outcome: Progressing Goal: Will not experience complications related to urinary retention Outcome: Progressing   Problem: Pain Managment: Goal: General experience of comfort will improve Outcome: Progressing   Problem: Safety: Goal: Ability to remain free from injury will improve Outcome: Progressing   Problem: Skin Integrity: Goal: Risk for impaired skin integrity will decrease Outcome: Progressing   Problem: Education: Goal: Ability to describe self-care measures that may prevent or decrease complications (Diabetes Survival Skills Education) will improve Outcome: Progressing Goal: Individualized Educational Video(s) Outcome: Progressing   Problem: Coping: Goal: Ability to adjust to condition or change in health will improve Outcome: Progressing   Problem: Fluid Volume: Goal: Ability to  maintain a balanced intake and output will improve Outcome: Progressing   Problem: Health Behavior/Discharge Planning: Goal: Ability to identify and utilize available resources and services will improve Outcome: Progressing Goal: Ability to manage health-related needs will improve Outcome: Progressing   Problem: Metabolic: Goal: Ability to maintain appropriate glucose levels will improve Outcome: Progressing   Problem: Nutritional: Goal: Maintenance of adequate nutrition will improve Outcome: Progressing Goal: Progress toward achieving an optimal weight will improve Outcome: Progressing   Problem: Skin Integrity: Goal: Risk for impaired skin integrity will decrease Outcome: Progressing   Problem: Tissue Perfusion: Goal: Adequacy of tissue perfusion will improve Outcome: Progressing   Problem: Education: Goal: Knowledge of disease or condition will improve Outcome: Progressing Goal: Knowledge of secondary prevention will improve (MUST DOCUMENT ALL) Outcome: Progressing Goal: Knowledge of patient specific risk factors will improve [place] N/A or DELETE if not current risk factor) Outcome: Progressing   Problem: Ischemic Stroke/TIA Tissue Perfusion: Goal: Complications of ischemic stroke/TIA will be minimized Outcome: Progressing   Problem: Coping: Goal: Will verbalize positive feelings about self Outcome: Progressing Goal: Will identify appropriate support needs Outcome: Progressing   Problem: Health Behavior/Discharge Planning: Goal: Ability to manage health-related needs will improve Outcome: Progressing Goal: Goals will be collaboratively established with patient/family Outcome: Progressing   Problem: Self-Care: Goal: Ability to participate in self-care as condition permits will improve Outcome: Progressing Goal: Verbalization of feelings and concerns over difficulty with self-care will improve Outcome: Progressing Goal: Ability to communicate needs accurately  will improve Outcome: Progressing   Problem: Nutrition: Goal: Risk of aspiration will decrease Outcome: Progressing Goal: Dietary intake will improve Outcome: Progressing   Problem: Education: Goal: Knowledge of disease or condition will improve Outcome: Progressing Goal: Knowledge of secondary prevention will improve (MUST DOCUMENT ALL) Outcome: Progressing Goal: Knowledge of patient specific risk factors will improve [place] N/A or DELETE if not current risk factor) Outcome: Progressing   Problem: Ischemic Stroke/TIA Tissue Perfusion: Goal: Complications of ischemic stroke/TIA will be minimized Outcome: Progressing   Problem: Coping: Goal: Will  verbalize positive feelings about self Outcome: Progressing Goal: Will identify appropriate support needs Outcome: Progressing   Problem: Health Behavior/Discharge Planning: Goal: Ability to manage health-related needs will improve Outcome: Progressing Goal: Goals will be collaboratively established with patient/family Outcome: Progressing

## 2022-06-15 DIAGNOSIS — I10 Essential (primary) hypertension: Secondary | ICD-10-CM

## 2022-06-15 LAB — GLUCOSE, CAPILLARY
Glucose-Capillary: 168 mg/dL — ABNORMAL HIGH (ref 70–99)
Glucose-Capillary: 137 mg/dL — ABNORMAL HIGH (ref 70–99)
Glucose-Capillary: 169 mg/dL — ABNORMAL HIGH (ref 70–99)
Glucose-Capillary: 125 mg/dL — ABNORMAL HIGH (ref 70–99)

## 2022-06-15 MED ORDER — AMLODIPINE BESYLATE 10 MG PO TABS
10.0000 mg | ORAL_TABLET | Freq: Every day | ORAL | Status: DC
  Administered 2022-06-15 – 2022-06-21 (×7): 10 mg via ORAL
  Filled 2022-06-15 (×7): qty 1

## 2022-06-15 MED ORDER — MELATONIN 3 MG PO TABS
3.0000 mg | ORAL_TABLET | Freq: Every evening | ORAL | Status: DC | PRN
  Administered 2022-06-15 – 2022-06-17 (×3): 3 mg via ORAL
  Filled 2022-06-15 (×3): qty 1

## 2022-06-15 MED ORDER — LIDOCAINE 5 % EX PTCH
1.0000 | MEDICATED_PATCH | CUTANEOUS | Status: DC
  Administered 2022-06-15 – 2022-06-20 (×6): 1 via TRANSDERMAL
  Filled 2022-06-15 (×6): qty 1

## 2022-06-15 NOTE — Progress Notes (Signed)
Occupational Therapy Treatment Patient Details Name: [name] MRN: [id number] DOB: [birth year] Today's Date: 06/15/2022   History of present illness [name] is a 62 y.o. female found to have new onset of right-sided weakness and worsening of dysarthria and drooling at [facility] hospital inpatient rehab unit on 06/12/22.  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 Patient transferred to [facility] acute care.  Medical history significant of multiple strokes in 2022, 2023 and 2024, HTN, HLD, IDDM, PVD, non-Hodgkin's lymphoma, sickle cell trait, CKD stage IIIa   OT comments  Patient with marked improvement in session to date. Patient with improved ability to move all extremities, with excellent initiation to roll and increased ability to assist with bed mobility. Patient also able to sit EOB with min guard to no assist for up to 20-30 seconds, correcting posture and bringing body into midline without prompting. Patient able to stand with +2 mod A in session to date. Highly recommending AIR reassess patient as patient has 24/7 support and is already making great progress in the limited amount of time that acute care has been able to work with the patient. OT will continue to follow.    Recommendations for follow up therapy are one component of a multi-disciplinary discharge planning process, led by the attending physician.  Recommendations may be updated based on patient status, additional functional criteria and insurance authorization.    Follow Up Recommendations  Acute inpatient rehab (3hours/day)     Assistance Recommended at Discharge Frequent or constant Supervision/Assistance  Patient can return home with the following  Two people to help with walking and/or transfers;A lot of help with bathing/dressing/bathroom;Assistance with cooking/housework;Direct supervision/assist for medications management;Assist for transportation;Help with stairs or ramp  for entrance;Direct supervision/assist for financial management   Equipment Recommendations  Other (comment) (Defer to next venue)    Recommendations for Other Services Rehab consult    Precautions / Restrictions Precautions Precautions: Fall Precaution Comments: Left Hemiparesis, Left knee buckles, support left arm in standing Restrictions Weight Bearing Restrictions: No       Mobility Bed Mobility Overal bed mobility: Needs Assistance Bed Mobility: Rolling, Sidelying to Sit, Sit to Sidelying Rolling: Mod assist (to left) Sidelying to sit: Max assist, +2 for physical assistance, HOB elevated     Sit to sidelying: Max assist, +2 for physical assistance General bed mobility comments: pt able to assist much more with rolling (x 3 for pericare prior to up to EOB); once legs over EOB, HOB elevated to initiate side to sit; pt unable to push up with LUE    Transfers Overall transfer level: Needs assistance Equipment used: None Transfers: Sit to/from Stand Sit to Stand: Mod assist, +2 physical assistance           General transfer comment: bil knees blocked with use of pad under pelvis; pt assisted to ~2/3 upright and pt then able to extend hips and back to bring herself the rest of the way up to full upright     Balance Overall balance assessment: Needs assistance Sitting-balance support: Single extremity supported, Feet supported Sitting balance-Leahy Scale: Poor Sitting balance - Comments: losing balance primarily posterior and  right; periods of min guard assist <10 seconds   Standing balance support: No upper extremity supported Standing balance-Leahy Scale: Poor                             ADL either performed or assessed  with clinical judgement   ADL Overall ADL's : Needs assistance/impaired     Grooming: Wash/dry hands;Wash/dry face;Sitting;Minimal assistance Grooming Details (indicate cue type and reason): able to bring washcloth to face and  complete, min A for thoroughness                 Toilet Transfer: Moderate assistance;+2 for physical assistance;+2 for safety/equipment Toilet Transfer Details (indicate cue type and reason): rolling back and forth for peri-care in bed Toileting- Clothing Manipulation and Hygiene: Total assistance;Bed level       Functional mobility during ADLs: Moderate assistance;+2 for physical assistance;+2 for safety/equipment;Cueing for safety;Cueing for sequencing General ADL Comments: Patient with marked improvement since eval, able to initiate further movement and max A of 2 to come into standing    Extremity/Trunk Assessment              Vision       Perception     Praxis      Cognition Arousal/Alertness: Awake/alert Behavior During Therapy: WFL for tasks assessed/performed Overall Cognitive Status: Impaired/Different from baseline Area of Impairment: Problem solving                             Problem Solving: Slow processing, Decreased initiation, Requires verbal cues, Requires tactile cues, Difficulty sequencing General Comments: required incr time for all movements        Exercises      Shoulder Instructions       General Comments      Pertinent Vitals/ Pain       Pain Assessment Pain Assessment: No/denies pain  Home Living                                          Prior Functioning/Environment              Frequency  Min 2X/week        Progress Toward Goals  OT Goals(current goals can now be found in the care plan section)  Progress towards OT goals: Progressing toward goals  Acute Rehab OT Goals Patient Stated Goal: to get back to rehab OT Goal Formulation: With patient Time For Goal Achievement: 06/27/22 Potential to Achieve Goals: Good  Plan Discharge plan remains appropriate    Co-evaluation      Reason for Co-Treatment: For patient/therapist safety;To address functional/ADL transfers PT goals  addressed during session: Mobility/safety with mobility;Balance;Strengthening/ROM        AM-PAC OT "6 Clicks" Daily Activity     Outcome Measure   Help from another person eating meals?: A Little Help from another person taking care of personal grooming?: A Little Help from another person toileting, which includes using toliet, bedpan, or urinal?: Total Help from another person bathing (including washing, rinsing, drying)?: A Lot Help from another person to put on and taking off regular upper body clothing?: A Lot Help from another person to put on and taking off regular lower body clothing?: A Lot 6 Click Score: 13    End of Session Equipment Utilized During Treatment: Gait belt  OT Visit Diagnosis: Unsteadiness on feet (R26.81);Other abnormalities of gait and mobility (R26.89);Muscle weakness (generalized) (M62.81)   Activity Tolerance Patient tolerated treatment well   Patient Left in bed;with call bell/phone within reach;with bed alarm set;with family/visitor present   Nurse Communication Mobility status  Time: [id number] OT Time Calculation (min): 31 min  Charges: OT General Charges $OT Visit: 1 Visit OT Treatments $Self Care/Home Management : 8-22 mins  Corinne Ports E. Tasean Mancha, OTR/L Acute Rehabilitation Services [phone]   Ascencion Dike 06/15/2022, 3:56 PM

## 2022-06-15 NOTE — Progress Notes (Signed)
PROGRESS NOTE    [name]  [id number] DOB: 1960-05-24 DOA: 06/12/2022 PCP: [name], [name], MD   Brief Narrative:  [name] is a 62 y.o. female with medical history significant of multiple strokes in 2022, 2023 and 2024, HTN, HLD, IDDM, PVD, non-Hodgkin's lymphoma, sickle cell trait, CKD stage IIIa, was found to have new onset of right-sided weakness and worsening of dysarthria and drooling at [facility] hospital inpatient rehab unit.   Patient admitted 2 weeks ago secondary to new onset of right medulla stroke,subsequently discharged to inpt rehab - L sided weakness/dysarthria noted at time of discharge. Staff at rehab on 06/12/22 noted seemingly new weakness of right arm and hand, when patient was unable to hold anything on her right hand as well as worsening of slurred speech as well as new left-sided drooling.  Neurology consulted, given that exact onset of her symptoms is unknown, decision made to not give TNK.  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 Admitted to hospitalist service with neurology consult.   Assessment & Plan:   Acute extension of previous stroke, with new onset of right-sided paresis, dysphagia and facial droop -MRI confirms "Increased size of a recent right medullary infarct which now extends to the left of midline.New punctate acute left cerebellar infarct." -Neurology following, recommending aspirin and Brilinta for 3 months followed by aspirin alone.  If there is difficulty in affording Brilinta then consider switching to aspirin and Plavix after 4 weeks. Initially there was concern about her ability to take medications orally but looks like she has done better in the last 24 to 48 hours. PT OT speech following. -Permissive hypertension per neurology goal less than 200/110 It appears that there is a plan for her to go back to CIR although there is concern about her ability to participate with therapy.  Will  continue to follow.  Neurology continues to follow.   Probable aspiration pneumonitis Pneumonia less likely but cannot be ruled out -Secondary to above presumed worsening dysphagia - Given acuity unlikely to be infectious rather than reactive inflammatory/pneumonitis - covering with 5 days of unasyn is not unreasonable though given her risk for ongoing aspiration, age and comorbidities.  Consider transitioning to oral agents tomorrow if she continues to be stable. -Procalcitonin reassuringly low   Essential hypertension -Allow permissive hypertension per neurology over the next 24 hours, ultimate goal normotensive -Hold home BP meds, continue as needed hydralazine   IDDM, uncontrolled with hyperglycemia -A1c 11.4 just last month Monitor CBGs.  Continue SSI.  Holding long-acting agent for now.   PAD -Will hold off cilostazol while patient on DAPT of aspirin and Brilinta   CKD stage IIIa -Euvolemic, creatinine level stable   Chronic HFpEF -Euvolemic/not in acute exacerbation -Home blood pressure medications on hold given permissive hypertension as above, follow volume status  Microcytic anemia No evidence of overt bleeding.  Outpatient management.  DVT prophylaxis: Lovenox Code Status: Full Family Communication: At bedside Disposition: CIR versus SNF depending on her progress.  Consultants:  Neurology  Procedures:  None  Antimicrobials:  Unasyn x 5 days  Subjective: Denies any complaints.  Wondering when she will improved.  No shortness of breath.   Objective: Vitals:   06/14/22 2333 06/15/22 0358 06/15/22 0526 06/15/22 0829  BP: (!) 197/81 (!) 154/141  (!) 194/81  Pulse: 62 77  71  Resp: 17 17  18  $ Temp: 98.6 F (37 C) 98.4 F (36.9 C)  99.1 F (37.3 C)  TempSrc: Oral Oral  Oral  SpO2: 99% 100%  100%  Weight:   84.3 kg     Intake/Output Summary (Last 24 hours) at 06/15/2022 0943 Last data filed at 06/15/2022 0027 Gross per 24 hour  Intake 860 ml  Output  950 ml  Net -90 ml    Filed Weights   06/14/22 0339 06/15/22 0526  Weight: 83.2 kg 84.3 kg    Examination:  General appearance: Awake alert.  In no distress Resp: Clear to auscultation bilaterally.  Normal effort Cardio: S1-S2 is normal regular.  No S3-S4.  No rubs murmurs or bruit GI: Abdomen is soft.  Nontender nondistended.  Bowel sounds are present normal.  No masses organomegaly Extremities: No edema.    Data Reviewed: I have personally reviewed following labs and imaging studies  CBC: Recent Labs  Lab 06/10/22 1249 06/12/22 1328 06/13/22 0720  WBC 9.3 7.0 6.1  NEUTROABS 4.9  --   --   HGB 11.4* 11.0* 10.6*  HCT 33.9* 32.9* 32.4*  MCV 76.7* 78.3* 78.6*  PLT 281 244 [id number]    Basic Metabolic Panel: Recent Labs  Lab 06/10/22 1249 06/12/22 1328 06/13/22 0720  NA 137 138 142  K 4.1 4.1 4.1  CL 106 111 113*  CO2 23 21* 20*  GLUCOSE 114* 104* 106*  BUN 28* 24* 20  CREATININE 1.31* 1.30* 1.22*  CALCIUM 9.8 9.6 9.5    GFR: Estimated Creatinine Clearance: 49.5 mL/min (A) (by C-G formula based on SCr of 1.22 mg/dL (H)).   CBG: Recent Labs  Lab 06/14/22 0612 06/14/22 1201 06/14/22 1648 06/14/22 2125 06/15/22 0605  GLUCAP 169* 164* 178* 147* 168*    Lipid Profile: Recent Labs    06/13/22 0720  CHOL 126  HDL 28*  LDLCALC 81  TRIG 84  CHOLHDL 4.5     Sepsis Labs: Recent Labs  Lab 06/12/22 1846 06/13/22 0720  PROCALCITON <0.10 <0.10       Radiology Studies: No results found.  Scheduled Meds:  aspirin EC  81 mg Oral Daily   enoxaparin (LOVENOX) injection  40 mg Subcutaneous Q24H   insulin aspart  0-15 Units Subcutaneous TID WC   pantoprazole  40 mg Oral QPM   ticagrelor  90 mg Oral BID   Continuous Infusions:  ampicillin-sulbactam (UNASYN) IV 3 g (06/15/22 0531)     LOS: 3 days   [facility]  If 7PM-7AM, please contact night-coverage [email or web address]  06/15/2022, 9:43 AM

## 2022-06-15 NOTE — Progress Notes (Signed)
Inpatient Rehabilitation Care Coordinator Discharge Note   Patient Details  Name: [name] MRN: [id number] Date of Birth: [birth year]   Discharge location: Acute  Length of Stay: N/A  Discharge activity level: Total A  Home/community participation: Sister and friend  Patient response [id number] Literacy - How often do you need to have someone help you when you read instructions, pamphlets, or other written material from your doctor or pharmacy?: Patient unable to respond  Patient response [id number] Isolation - How often do you feel lonely or isolated from those around you?: Patient unable to respond  Services provided included: SW, Pharmacy, TR, CM, SLP, RN, OT, PT, RD, MD  Financial Services:  Financial Services Utilized: Chartered certified accountant  Choices offered to/list presented to: N/A  Follow-up services arranged:              Patient response to transportation need: Is the patient able to respond to transportation needs?: No In the past 12 months, has lack of transportation kept you from medical appointments or from getting medications?: No In the past 12 months, has lack of transportation kept you from meetings, work, or from getting things needed for daily living?: No    Comments (or additional information):  Patient/Family verbalized understanding of follow-up arrangements:     Individual responsible for coordination of the follow-up plan: Arlana Hove [phone]  Confirmed correct DME delivered: Dyanne Iha 06/15/2022    Dyanne Iha

## 2022-06-15 NOTE — Progress Notes (Signed)
Physical Therapy Treatment Patient Details Name: [name] MRN: [id number] DOB: 04-Nov-1960 Today's Date: 06/15/2022   History of Present Illness [name] is a 62 y.o. female found to have new onset of right-sided weakness and worsening of dysarthria and drooling at [facility] inpatient rehab unit on 06/12/22.  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 Patient transferred to [facility] acute care.  Medical history significant of multiple strokes in 2022, 2023 and 2024, HTN, HLD, IDDM, PVD, non-Hodgkin's lymphoma, sickle cell trait, CKD stage IIIa    PT Comments    Patient with much brighter affect, clearer speech and increased ability to use Rt extremities during functional tasks. Trunk control improved to point that we were able to stand with +2 mod assist. Patient with energy to stand again, however PT had time constraint and had to end session after standing once. Discussed her family's ability to care for her on discharge and she states she has 2 sisters and other family members who will assist. Patient highly motivated and with great participation.     Recommendations for follow up therapy are one component of a multi-disciplinary discharge planning process, led by the attending physician.  Recommendations may be updated based on patient status, additional functional criteria and insurance authorization.  Follow Up Recommendations  Acute inpatient rehab (3hours/day)     Assistance Recommended at Discharge Frequent or constant Supervision/Assistance  Patient can return home with the following Two people to help with walking and/or transfers;Two people to help with bathing/dressing/bathroom;Assist for transportation;Help with stairs or ramp for entrance   Equipment Recommendations  Wheelchair (measurements PT);Wheelchair cushion (measurements PT);BSC/3in1;Hospital bed    Recommendations for Other Services Rehab consult      Precautions / Restrictions Precautions Precautions: Fall Precaution Comments: Left Hemiparesis, Left knee buckles, support left arm in standing Restrictions Weight Bearing Restrictions: No     Mobility  Bed Mobility Overal bed mobility: Needs Assistance Bed Mobility: Rolling, Sidelying to Sit, Sit to Sidelying Rolling: Mod assist (to left) Sidelying to sit: Max assist, +2 for physical assistance, HOB elevated     Sit to sidelying: Max assist, +2 for physical assistance General bed mobility comments: pt able to assist much more with rolling (x 3 for pericare prior to up to EOB); once legs over EOB, HOB elevated to initiate side to sit; pt unable to push up with LUE    Transfers Overall transfer level: Needs assistance Equipment used: None Transfers: Sit to/from Stand Sit to Stand: Mod assist, +2 physical assistance           General transfer comment: bil knees blocked with use of pad under pelvis; pt assisted to ~2/3 upright and pt then able to extend hips and back to bring herself the rest of the way up to full upright    Ambulation/Gait                   Stairs             Wheelchair Mobility    Modified Rankin (Stroke Patients Only) Modified Rankin (Stroke Patients Only) Pre-Morbid Rankin Score: Moderate disability Modified Rankin: Severe disability     Balance Overall balance assessment: Needs assistance Sitting-balance support: Single extremity supported, Feet supported Sitting balance-Leahy Scale: Poor Sitting balance - Comments: losing balance primarily posterior and  right; periods of min guard assist <10 seconds   Standing balance support: No upper extremity supported Standing balance-Leahy Scale: Poor  Cognition Arousal/Alertness: Awake/alert Behavior During Therapy: WFL for tasks assessed/performed Overall Cognitive Status: Impaired/Different from baseline Area of Impairment: Problem  solving                             Problem Solving: Slow processing, Decreased initiation, Requires verbal cues, Requires tactile cues, Difficulty sequencing General Comments: required incr time for all movements        Exercises      General Comments        Pertinent Vitals/Pain Pain Assessment Pain Assessment: No/denies pain    Home Living                          Prior Function            PT Goals (current goals can now be found in the care plan section) Acute Rehab PT Goals Patient Stated Goal: to get better PT Goal Formulation: With patient Time For Goal Achievement: 06/27/22 Potential to Achieve Goals: Good Progress towards PT goals: Progressing toward goals    Frequency    Min 3X/week      PT Plan Current plan remains appropriate    Co-evaluation PT/OT/SLP Co-Evaluation/Treatment: Yes Reason for Co-Treatment: For patient/therapist safety;To address functional/ADL transfers PT goals addressed during session: Mobility/safety with mobility;Balance;Strengthening/ROM        AM-PAC PT "6 Clicks" Mobility   Outcome Measure  Help needed turning from your back to your side while in a flat bed without using bedrails?: A Lot Help needed moving from lying on your back to sitting on the side of a flat bed without using bedrails?: Total Help needed moving to and from a bed to a chair (including a wheelchair)?: Total Help needed standing up from a chair using your arms (e.g., wheelchair or bedside chair)?: Total Help needed to walk in hospital room?: Total Help needed climbing 3-5 steps with a railing? : Total 6 Click Score: 7    End of Session Equipment Utilized During Treatment: Gait belt Activity Tolerance: Patient tolerated treatment well Patient left: in bed;with call bell/phone within reach;with bed alarm set;Other (comment) (with OT) Nurse Communication: Mobility status;Need for lift equipment PT Visit Diagnosis: Hemiplegia and  hemiparesis;Difficulty in walking, not elsewhere classified (R26.2) Hemiplegia - Right/Left: Right Hemiplegia - dominant/non-dominant: Dominant Hemiplegia - caused by: Cerebral infarction     Time: [id number] PT Time Calculation (min) (ACUTE ONLY): 32 min  Charges:  $Therapeutic Activity: 8-22 mins                      Arby Barrette, PT Acute Rehabilitation Services  Office [phone]    Rexanne Mano 06/15/2022, 10:59 AM

## 2022-06-15 NOTE — Progress Notes (Signed)
STROKE TEAM PROGRESS NOTE   INTERVAL HISTORY Her  therapist are at the bedside.  She is doing much  better and participating a lot more with therapy.  Therapy eval still recommends inpatient rehab.  Vital signs stable.  Neurological exam unchanged Patient is interested in considering possible participation in the [place] stroke trial Vitals:   06/14/22 2333 06/15/22 0358 06/15/22 0526 06/15/22 0829  BP: (!) 197/81 (!) 154/141  (!) 194/81  Pulse: 62 77  71  Resp: '17 17  18  '$ Temp: 98.6 F (37 C) 98.4 F (36.9 C)  99.1 F (37.3 C)  TempSrc: Oral Oral  Oral  SpO2: 99% 100%  100%  Weight:   84.3 kg    CBC:  Recent Labs  Lab 06/10/22 1249 06/12/22 1328 06/13/22 0720  WBC 9.3 7.0 6.1  NEUTROABS 4.9  --   --   HGB 11.4* 11.0* 10.6*  HCT 33.9* 32.9* 32.4*  MCV 76.7* 78.3* 78.6*  PLT 281 244 371   Basic Metabolic Panel:  Recent Labs  Lab 06/12/22 1328 06/13/22 0720  NA 138 142  K 4.1 4.1  CL 111 113*  CO2 21* 20*  GLUCOSE 104* 106*  BUN 24* 20  CREATININE 1.30* 1.22*  CALCIUM 9.6 9.5   Lipid Panel:  Recent Labs  Lab 06/13/22 0720  CHOL 126  TRIG 84  HDL 28*  CHOLHDL 4.5  VLDL 17  LDLCALC 81   HgbA1c: No results for input(s): "HGBA1C" in the last 168 hours. Urine Drug Screen: No results for input(s): "LABOPIA", "COCAINSCRNUR", "LABBENZ", "AMPHETMU", "THCU", "LABBARB" in the last 168 hours.  Alcohol Level No results for input(s): "ETH" in the last 168 hours.  IMAGING past 24 hours No results found.  PHYSICAL EXAM General - Well nourished, well developed pleasant middle-aged African-American lady, in mild distress due to dyspnea. Respiratory: Regular, unlabored respirations  Mental Status - Level of arousal and orientation to time, place, and person were intact. Language naming, repetition, comprehension are intact.  Severely dysarthric. Attention span and concentration were normal. Cranial Nerves II - XII - II - Visual field intact OU. III, IV, VI -  Extraocular movements intact. V - Facial sensation intact bilaterally. VII - Facial movement intact bilaterally. VIII - Hearing & vestibular intact bilaterally. X - Palate elevates symmetrically. XI - shoulder shrug absent on left and weak on right XII - Tongue protrusion intact.   Motor Strength -1 out of 5 strength to left upper extremity, 3 out of 5 strength to right upper extremity, 2 out of 5 strength to right lower extremity, 1 out of 5 strength to left lower extremity Motor Tone - Muscle tone was assessed at the neck and was noted to be normal at the neck and decreased elsewhere.   Sensory - Light touch was assessed and found to be symmetrical.     Coordination -unable to perform   Gait and Station - deferred.   ASSESSMENT/PLAN Ms. Demiana Crumbley is a 62 y.o. female with history of diabetes, GBS in 1988, hypertension, several strokes, CKD, non-Hodgkin's lymphoma and questionable history of possible prior GBS who was originally admitted on 1/28 with dizziness diplopia and unusual sensations through her body. Yesterday morning, noted to be different from her clinical condition of yesterday with worsening right-sided weakness, difficulty breathing and more slurred speech than baseline. Repeat MRI demonstrates extension of known right medullary stroke to now and all the left side.  Patient is noted to have labored respirations with visible accessory muscle use  and states that she is having a hard time breathing.  Stroke:  Acute left cerebellar infarct and extension of the right medullary infarct to the left Etiology:  severe posterior circulation narrowing of bilateral terminal vertebral arteries 2/11- MRI Increased size of a recent right medullary infarct which now extends to the left of midline. New punctate acute left cerebellar infarct MRA  Head-1. Diminutive enhancement of the right V4 segment with intermittent occlusion, and multifocal severe stenosis/near occlusion of the left V4  segment, similar to the prior CTA. Additional intracranial atherosclerotic disease as detailed above resulting in moderate stenosis of the left cavernous ICA, mild-to-moderate stenosis of the right supraclinoid ICA, severe stenosis of the origin of the left superior M2 division, severe stenosis of the distal right M1 segment, and severe stenosis of the proximal left P1 segment.  MRA Neck- 1. 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 Moderate stenosis of the origin of the left vertebral artery, not significantly changed. 2D Echo EF 60-65% LDL 123 HgbA1c 11.4 VTE prophylaxis -lovenox    Diet   Diet Carb Modified Fluid consistency: Thin; Room service appropriate? Yes with Assist   clopidogrel 75 mg daily and pletal '50mg'$  BID prior to admission, now on aspirin 81 mg daily and Brilinta (ticagrelor) 90 mg bid. X 3 months followed by aspirin alone Therapy recommendations:  pending Disposition:  pending  Hypertension Home meds:  coozar, norvasc  Stable Permissive hypertension (OK if < 220/120) but gradually normalize in 5-7 days Long-term BP goal normotensive  Hyperlipidemia Home meds:  atorvastatin '80mg'$ , resumed in hospital LDL 123, goal < 70 Continue statin at discharge  Diabetes type II Uncontrolled Home meds:  trulicity  [id number] 93.8, goal < 7.0 CBGs Recent Labs    06/14/22 1648 06/14/22 2125 06/15/22 0605  GLUCAP 178* 147* 168*    SSI  Other Stroke Risk Factors Hx stroke/TIA Previous stroke- 1/29 Right medullary stroke  09/17/2021 Bilateral cerebellum punctate infarcts, likely secondary to large vessel disease source from bilateral VA occlusion  02/08/2021 Two acute/early subacute infarcts within the left pons measuring up to 8 mm.  Other Active Problems History of lymphoma Diabetic neuropathy  Hospital day # 3  Patient presented with right medullary infarct from symptomatic terminal right vertebral artery occlusion/stenosis 2  weeks ago and was inpatient rehab but had unfortunate neurological worsening and now shows additional left medullary infarct with symptomatic high-grade terminal left vertebral stenosis.  She remains at risk for neurological worsening needs aggressive medical management with dual antiplatelet therapy aspirin and Brilinta for 3 months if she can afford it followed by aspirin alone otherwise may consider switching to aspirin and Plavix after 4 weeks.  Strict control of hypertension and blood pressure goal below 130/90, lipids with LDL cholesterol goal below 70 mg percent and diabetes with hemoglobin A1c goal below 6.5%.  Continue physical occupational therapy and speech therapy and transfer back to rehab when stable in the next few days.  Patient appears interested in participation in the [employer] stroke prevention study and I will give information to patient and sister to review and decide.  Greater than 50% time during this 25-minute visit was spent in counseling and coordination of care and discussion with patient and care team and sister and answering questions. Stroke team will sign off.  Kindly call for questions.  Discussed with Dr. [place] Pink on Antony Contras, [place] Pager: [phone] 06/15/2022 10:53 AM   To contact Stroke  Continuity provider, please refer to [email or web address] After hours, contact General Neurology

## 2022-06-15 NOTE — Progress Notes (Signed)
Inpatient Rehab Admissions Coordinator:    I met with pt. At bedside and spoke to her sister over the phone. Explained that Pt. Is functioning at a lower level than when she previously came to CIR and that I do not think she will be at supervision to light min assist level after a short stay on CIR (this is the level of care that Pt.'s sister Hassan Rowan and her friend Kennyth Lose had previously stated that they were able to provide). They are in agreement that it makes sense to pursue SNF at this time. I will sign off and let TOC know.  Clemens Catholic, [place], [place] Admissions Coordinator  [phone] ([place]) [phone] (office)

## 2022-06-15 NOTE — NC FL2 (Signed)
[place] LEVEL OF CARE FORM     IDENTIFICATION  Patient Name: Raynisha Jalali Birthdate: 11-Dec-1960 Sex: female Admission Date (Current Location): 06/12/2022  [facility] and [place] Number:  Herbalist and Address:  [facility] [place], [place] [address] [place], [place] [address]      Provider Number: [id number]  Attending Physician Name and Address:  Bonnielee Haff, MD  Relative Name and Phone Number:       Current Level of Care: Hospital Recommended Level of Care: [place] Prior Approval Number:    Date Approved/Denied:   PASRR Number: [id number]  Discharge Plan: SNF    Current Diagnoses: Patient Active Problem List   Diagnosis Date Noted   Dysarthria 06/12/2022   Aspiration pneumonia ([place]) 06/12/2022   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[place]) 06/12/2022   Anxiety disorder due to medical condition 06/10/2022   AKI (acute kidney injury) ([place]) 06/01/2022   Dizziness 05/29/2022   Personal history of CLL (chronic lymphocytic leukemia) 05/29/2022   Abdominal pain 05/29/2022   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 disease, with long-term current use of insulin ([place]) 04/14/2022   Type 2 diabetes mellitus with diabetic polyneuropathy, with long-term current use of insulin ([place]) 04/14/2022   Routine general medical examination at a health care facility 03/16/2022   Hyperlipidemia LDL goal <70 10/01/2021   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[place]) 09/17/2021   Stage 3a chronic kidney disease (CKD) ([place]) 09/16/2021   Thyroid nodule 09/16/2021   Cervical lymphadenopathy 09/16/2021   Acute CVA (cerebrovascular accident) ([place]) 09/15/2021   GERD (gastroesophageal reflux disease) 04/30/2021   Peripheral arterial disease ([place]) 04/13/2021   Atherosclerosis of aorta ([place]) 02/14/2021   Cerebral thrombosis with cerebral infarction 02/08/2021   Abnormal MRI of head 02/07/2021   Vertigo 02/07/2021   Class 1 obesity due to excess  calories with body mass index (BMI) of 31.0 to 31.9 in adult 02/07/2021   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   Trigger finger, left ring finger 10/04/2018   Alternating constipation and diarrhea 04/20/2018   Diabetic peripheral neuropathy associated with type 2 diabetes mellitus ([place]) 02/26/2018   DM (diabetes mellitus), type 2 with peripheral vascular complications ([place]) [id number]   ABNORMALITY OF GAIT 05/10/2010   ECZEMA, ATOPIC 04/03/2009   Hyperlipidemia associated with type 2 diabetes mellitus ([place]) 03/06/2009   BACK PAIN 11/14/2008   Cerebrovascular disease 08/05/2008   DIPLOPIA 07/15/2008   Essential hypertension 07/15/2008    Orientation RESPIRATION BLADDER Height & Weight     Self, Time, Situation, Place  Normal Incontinent Weight: 185 lb 13.6 oz (84.3 kg) Height:     BEHAVIORAL SYMPTOMS/MOOD NEUROLOGICAL BOWEL NUTRITION STATUS      Incontinent Diet (carb modified)  AMBULATORY STATUS COMMUNICATION OF NEEDS Skin   Extensive Assist Verbally Normal                       Personal Care Assistance Level of Assistance  Bathing, Feeding, Dressing Bathing Assistance: Maximum assistance Feeding assistance: Limited assistance Dressing Assistance: Maximum assistance     Functional Limitations Info  Speech     Speech Info: Impaired (dysarthria)    SPECIAL CARE FACTORS FREQUENCY  PT (By licensed PT), OT (By licensed OT)     PT Frequency: 5x/wk OT Frequency: 5x/wk            Contractures Contractures Info: Not present    Additional Factors Info  Code Status, Allergies, Insulin Sliding Scale Code Status  Info: Full Allergies Info: NKA   Insulin Sliding Scale Info: see DC summary       Current Medications (06/15/2022):  This is the current hospital active medication list Current Facility-Administered Medications  Medication Dose Route Frequency Provider Last Rate Last Admin   acetaminophen (TYLENOL) tablet 650 mg  650 mg Oral Q4H PRN Wynetta Fines T,  MD   650 mg at 06/15/22 0805   Or   acetaminophen (TYLENOL) 160 MG/5ML solution 650 mg  650 mg Per Tube Q4H PRN Wynetta Fines T, MD       Or   acetaminophen (TYLENOL) suppository 650 mg  650 mg Rectal Q4H PRN Wynetta Fines T, MD       Ampicillin-Sulbactam (UNASYN) 3 g in sodium chloride 0.9 % 100 mL IVPB  3 g Intravenous Q6H Wynetta Fines T, MD 200 mL/hr at 06/15/22 1312 3 g at 06/15/22 1312   aspirin EC tablet 81 mg  81 mg Oral Daily Little Ishikawa, MD   81 mg at 06/15/22 1031   enoxaparin (LOVENOX) injection 40 mg  40 mg Subcutaneous Q24H Wynetta Fines T, MD   40 mg at 06/14/22 1737   hydrALAZINE (APRESOLINE) injection 5 mg  5 mg Intravenous Q6H PRN Wynetta Fines T, MD   5 mg at 06/15/22 1258   insulin aspart (novoLOG) injection 0-15 Units  0-15 Units Subcutaneous TID WC Wynetta Fines T, MD   2 Units at 06/15/22 1259   pantoprazole (PROTONIX) EC tablet 40 mg  40 mg Oral QPM Donnamae Jude, RPH   40 mg at 06/14/22 1745   senna-docusate (Senokot-S) tablet 1 tablet  1 tablet Oral QHS PRN Lequita Halt, MD       ticagrelor [facility]) tablet 90 mg  90 mg Oral BID Wynetta Fines T, MD   90 mg at 06/15/22 1031     Discharge Medications: Please see discharge summary for a list of discharge medications.  Relevant Imaging Results:  Relevant Lab Results:   Additional Information SS#: [id number]  Geralynn Ochs, LCSW

## 2022-06-15 NOTE — TOC Initial Note (Signed)
Transition of Care [facility]) - Initial/Assessment Note    Patient Details  Name: [name] MRN: [id number] Date of Birth: 1960/11/21  Transition of Care [facility]) CM/SW Contact:    Geralynn Ochs, LCSW Phone Number: 06/15/2022, 4:08 PM  Clinical Narrative:       CSW updated by rehab admissions that patient will need SNF placement, not likely to tolerate intensity of CIR at this time. Patient and sister agreeable to SNF at discharge. CSW faxed out referral, awaiting responses. CSW to follow.            Expected Discharge Plan: Skilled Nursing Facility Barriers to Discharge: Continued Medical Work up, Ship broker   Patient Goals and CMS Choice Patient states their goals for this hospitalization and ongoing recovery are:: to get better CMS Medicare.gov Compare Post Acute Care list provided to:: Patient Choice offered to / list presented to : Patient [place] ownership interest in [facility].provided to:: Patient    Expected Discharge Plan and Services     Post Acute Care Choice: [place] arrangements for the past 2 months: Single Family Home                                      Prior Living Arrangements/Services Living arrangements for the past 2 months: Single Family Home   Patient language and need for interpreter reviewed:: No Do you feel safe going back to the place where you live?: Yes      Need for Family Participation in Patient Care: Yes (Comment) Care giver support system in place?: Yes (comment)   Criminal Activity/Legal Involvement Pertinent to Current Situation/Hospitalization: No - Comment as needed  Activities of Daily Living      Permission Sought/Granted Permission sought to share information with : Facility Sport and exercise psychologist, Family Supports Permission granted to share information with : Yes, Verbal Permission Granted  Share Information with NAME: Hassan Rowan  Permission granted to share info w  AGENCY: SNF  Permission granted to share info w Relationship: Sister     Emotional Assessment Appearance:: Appears stated age Attitude/Demeanor/Rapport: Engaged Affect (typically observed): Appropriate Orientation: : Oriented to Self, Oriented to Place, Oriented to  Time, Oriented to Situation Alcohol / Substance Use: Not Applicable Psych Involvement: No (comment)  Admission diagnosis:  Stroke [facility]) [I63.9] Patient Active Problem List   Diagnosis Date Noted   Dysarthria 06/12/2022   Aspiration pneumonia ([place]) 06/12/2022   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[place]) 06/12/2022   Anxiety disorder due to medical condition 06/10/2022   AKI (acute kidney injury) ([place]) 06/01/2022   Dizziness 05/29/2022   Personal history of CLL (chronic lymphocytic leukemia) 05/29/2022   Abdominal pain 05/29/2022   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 disease, with long-term current use of insulin ([place]) 04/14/2022   Type 2 diabetes mellitus with diabetic polyneuropathy, with long-term current use of insulin ([place]) 04/14/2022   Routine general medical examination at a health care facility 03/16/2022   Hyperlipidemia LDL goal <70 10/01/2021   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[place]) 09/17/2021   Stage 3a chronic kidney disease (CKD) ([place]) 09/16/2021   Thyroid nodule 09/16/2021   Cervical lymphadenopathy 09/16/2021   Acute CVA (cerebrovascular accident) ([place]) 09/15/2021   GERD (gastroesophageal reflux disease) 04/30/2021   Peripheral arterial disease ([place]) 04/13/2021   Atherosclerosis of aorta ([place]) 02/14/2021   Cerebral thrombosis with cerebral infarction 02/08/2021   Abnormal MRI of head 02/07/2021  Vertigo 02/07/2021   Class 1 obesity due to excess calories with body mass index (BMI) of 31.0 to 31.9 in adult 02/07/2021   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   Trigger finger, left ring finger 10/04/2018   Alternating constipation and diarrhea 04/20/2018   Diabetic peripheral neuropathy associated  with type 2 diabetes mellitus ([place]) 02/26/2018   DM (diabetes mellitus), type 2 with peripheral vascular complications ([place]) [id number]   ABNORMALITY OF GAIT 05/10/2010   ECZEMA, ATOPIC 04/03/2009   Hyperlipidemia associated with type 2 diabetes mellitus ([place]) 03/06/2009   BACK PAIN 11/14/2008   Cerebrovascular disease 08/05/2008   DIPLOPIA 07/15/2008   Essential hypertension 07/15/2008   PCP:  [name], [name], MD Pharmacy:   [facility], [place] [place] [address] Phone: [phone] Fax: [place] [address] [place] [address] Phone: [phone] Fax: [place] [address] [place] [address] Phone: [phone] Fax: [phone]     Social Determinants of Health (SDOH) Social History: [place]: No Food Insecurity (05/29/2022)  Housing: Low Risk  (05/29/2022)  Transportation Needs: No Transportation Needs (05/29/2022)  Utilities: Not At Risk (05/29/2022)  Depression (PHQ2-9): Low Risk  (03/16/2022)  Tobacco Use: Low Risk  (06/09/2022)   SDOH Interventions:     Readmission Risk Interventions     No data to display

## 2022-06-16 DIAGNOSIS — R197 Diarrhea, unspecified: Secondary | ICD-10-CM

## 2022-06-16 LAB — BASIC METABOLIC PANEL
Anion gap: 14 (ref 5–15)
BUN: 18 mg/dL (ref 8–23)
CO2: 18 mmol/L — ABNORMAL LOW (ref 22–32)
Calcium: 9.4 mg/dL (ref 8.9–10.3)
Chloride: 105 mmol/L (ref 98–111)
Creatinine, Ser: 1.16 mg/dL — ABNORMAL HIGH (ref 0.44–1.00)
GFR, Estimated: 53 mL/min — ABNORMAL LOW (ref >60)
Glucose, Bld: 162 mg/dL — ABNORMAL HIGH (ref 70–99)
Potassium: 3.8 mmol/L (ref 3.5–5.1)
Sodium: 137 mmol/L (ref 135–145)

## 2022-06-16 LAB — CBC
HCT: 35 % — ABNORMAL LOW (ref 36.0–46.0)
Hemoglobin: 11.5 g/dL — ABNORMAL LOW (ref 12.0–15.0)
MCH: 25.4 pg — ABNORMAL LOW (ref 26.0–34.0)
MCHC: 32.9 g/dL (ref 30.0–36.0)
MCV: 77.4 fL — ABNORMAL LOW (ref 80.0–100.0)
Platelets: 271 10*3/uL (ref 150–400)
RBC: 4.52 MIL/uL (ref 3.87–5.11)
RDW: 15.3 % (ref 11.5–15.5)
WBC: 14.3 10*3/uL — ABNORMAL HIGH (ref 4.0–10.5)
nRBC: 0 % (ref 0.0–0.2)

## 2022-06-16 LAB — GLUCOSE, CAPILLARY
Glucose-Capillary: 170 mg/dL — ABNORMAL HIGH (ref 70–99)
Glucose-Capillary: 147 mg/dL — ABNORMAL HIGH (ref 70–99)
Glucose-Capillary: 117 mg/dL — ABNORMAL HIGH (ref 70–99)
Glucose-Capillary: 153 mg/dL — ABNORMAL HIGH (ref 70–99)

## 2022-06-16 MED ORDER — LOPERAMIDE HCL 2 MG PO CAPS: 4.0000 mg | ORAL_CAPSULE | Freq: Three times a day (TID) | ORAL | Status: DC | PRN

## 2022-06-16 MED ORDER — LOPERAMIDE HCL 2 MG PO CAPS
4.0000 mg | ORAL_CAPSULE | Freq: Three times a day (TID) | ORAL | Status: DC | PRN
  Administered 2022-06-19 – 2022-06-20 (×3): 4 mg via ORAL
  Filled 2022-06-16 (×3): qty 2

## 2022-06-16 MED ORDER — SACCHAROMYCES BOULARDII 250 MG PO CAPS
250.0000 mg | ORAL_CAPSULE | Freq: Two times a day (BID) | ORAL | Status: DC
  Administered 2022-06-16 – 2022-06-21 (×11): 250 mg via ORAL
  Filled 2022-06-16 (×12): qty 1

## 2022-06-16 MED ORDER — LOPERAMIDE HCL 2 MG PO CAPS
4.0000 mg | ORAL_CAPSULE | Freq: Three times a day (TID) | ORAL | Status: AC
  Administered 2022-06-16 (×2): 4 mg via ORAL
  Filled 2022-06-16 (×2): qty 2

## 2022-06-16 MED ORDER — ATORVASTATIN CALCIUM 80 MG PO TABS
80.0000 mg | ORAL_TABLET | Freq: Every day | ORAL | Status: DC
  Administered 2022-06-16 – 2022-06-21 (×6): 80 mg via ORAL
  Filled 2022-06-16 (×6): qty 1

## 2022-06-16 NOTE — Plan of Care (Signed)
Problem: Education: Goal: Knowledge of General Education information will improve Description: Including pain rating scale, medication(s)/side effects and non-pharmacologic comfort measures Outcome: Progressing   Problem: Health Behavior/Discharge Planning: Goal: Ability to manage health-related needs will improve Outcome: Progressing   Problem: Clinical Measurements: Goal: Ability to maintain clinical measurements within normal limits will improve Outcome: Progressing Goal: Will remain free from infection Outcome: Progressing Goal: Diagnostic test results will improve Outcome: Progressing Goal: Respiratory complications will improve Outcome: Progressing Goal: Cardiovascular complication will be avoided Outcome: Progressing   Problem: Activity: Goal: Risk for activity intolerance will decrease Outcome: Progressing   Problem: Nutrition: Goal: Adequate nutrition will be maintained Outcome: Progressing   Problem: Coping: Goal: Level of anxiety will decrease Outcome: Progressing   Problem: Elimination: Goal: Will not experience complications related to bowel motility Outcome: Progressing Goal: Will not experience complications related to urinary retention Outcome: Progressing   Problem: Pain Managment: Goal: General experience of comfort will improve Outcome: Progressing   Problem: Safety: Goal: Ability to remain free from injury will improve Outcome: Progressing   Problem: Skin Integrity: Goal: Risk for impaired skin integrity will decrease Outcome: Progressing   Problem: Education: Goal: Ability to describe self-care measures that may prevent or decrease complications (Diabetes Survival Skills Education) will improve Outcome: Progressing Goal: Individualized Educational Video(s) Outcome: Progressing   Problem: Coping: Goal: Ability to adjust to condition or change in health will improve Outcome: Progressing   Problem: Fluid Volume: Goal: Ability to  maintain a balanced intake and output will improve Outcome: Progressing   Problem: Health Behavior/Discharge Planning: Goal: Ability to identify and utilize available resources and services will improve Outcome: Progressing Goal: Ability to manage health-related needs will improve Outcome: Progressing   Problem: Metabolic: Goal: Ability to maintain appropriate glucose levels will improve Outcome: Progressing   Problem: Nutritional: Goal: Maintenance of adequate nutrition will improve Outcome: Progressing Goal: Progress toward achieving an optimal weight will improve Outcome: Progressing   Problem: Skin Integrity: Goal: Risk for impaired skin integrity will decrease Outcome: Progressing   Problem: Tissue Perfusion: Goal: Adequacy of tissue perfusion will improve Outcome: Progressing   Problem: Education: Goal: Knowledge of disease or condition will improve Outcome: Progressing Goal: Knowledge of secondary prevention will improve (MUST DOCUMENT ALL) Outcome: Progressing Goal: Knowledge of patient specific risk factors will improve [place] N/A or DELETE if not current risk factor) Outcome: Progressing   Problem: Ischemic Stroke/TIA Tissue Perfusion: Goal: Complications of ischemic stroke/TIA will be minimized Outcome: Progressing   Problem: Coping: Goal: Will verbalize positive feelings about self Outcome: Progressing Goal: Will identify appropriate support needs Outcome: Progressing   Problem: Health Behavior/Discharge Planning: Goal: Ability to manage health-related needs will improve Outcome: Progressing Goal: Goals will be collaboratively established with patient/family Outcome: Progressing   Problem: Self-Care: Goal: Ability to participate in self-care as condition permits will improve Outcome: Progressing Goal: Verbalization of feelings and concerns over difficulty with self-care will improve Outcome: Progressing Goal: Ability to communicate needs accurately  will improve Outcome: Progressing   Problem: Nutrition: Goal: Risk of aspiration will decrease Outcome: Progressing Goal: Dietary intake will improve Outcome: Progressing   Problem: Education: Goal: Knowledge of disease or condition will improve Outcome: Progressing Goal: Knowledge of secondary prevention will improve (MUST DOCUMENT ALL) Outcome: Progressing Goal: Knowledge of patient specific risk factors will improve [place] N/A or DELETE if not current risk factor) Outcome: Progressing   Problem: Ischemic Stroke/TIA Tissue Perfusion: Goal: Complications of ischemic stroke/TIA will be minimized Outcome: Progressing   Problem: Coping: Goal: Will  verbalize positive feelings about self Outcome: Progressing Goal: Will identify appropriate support needs Outcome: Progressing   Problem: Health Behavior/Discharge Planning: Goal: Ability to manage health-related needs will improve Outcome: Progressing Goal: Goals will be collaboratively established with patient/family Outcome: Progressing

## 2022-06-16 NOTE — Progress Notes (Signed)
PROGRESS NOTE    [name]  [id number] DOB: 21-Apr-1961 DOA: 06/12/2022 PCP: [name], [name], MD   Brief Narrative:  [name] is a 62 y.o. female with medical history significant of multiple strokes in 2022, 2023 and 2024, HTN, HLD, IDDM, PVD, non-Hodgkin's lymphoma, sickle cell trait, CKD stage IIIa, was found to have new onset of right-sided weakness and worsening of dysarthria and drooling at [facility] hospital inpatient rehab unit.   Patient admitted 2 weeks ago secondary to new onset of right medulla stroke,subsequently discharged to inpt rehab - L sided weakness/dysarthria noted at time of discharge. Staff at rehab on 06/12/22 noted seemingly new weakness of right arm and hand, when patient was unable to hold anything on her right hand as well as worsening of slurred speech as well as new left-sided drooling.  Neurology consulted, given that exact onset of her symptoms is unknown, decision made to not give TNK.  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 Admitted to hospitalist service with neurology consult.   Assessment & Plan:   Acute extension of previous stroke, with new onset of right-sided paresis, dysphagia and facial droop -MRI confirms "Increased size of a recent right medullary infarct which now extends to the left of midline.New punctate acute left cerebellar infarct." -Neurology following, recommending aspirin and Brilinta for 3 months followed by aspirin alone.  If there is difficulty in affording Brilinta then consider switching to aspirin and Plavix after 4 weeks. Initially there was concern about her ability to take medications orally but looks like she has done better in the last 24 to 48 hours. PT OT speech following. Initially there was plan for the patient to go back to CIR but does not appear that that is an option currently.  SNF being pursued.   Probable aspiration pneumonitis Pneumonia less likely but  cannot be ruled out -Secondary to above presumed worsening dysphagia - Given acuity unlikely to be infectious rather than reactive inflammatory/pneumonitis. Patient was started on Unasyn.  Experiencing diarrhea which is most likely due to antibiotics.  Procalcitonin was unremarkable.  Will stop antibiotics.  Acute diarrhea Most likely due to antibiotics.  Abdomen is benign.  Will give her Imodium and probiotics.  Will stop her antibiotics as discussed above.   Essential hypertension Initially allowed permissive hypertension.  Blood pressure to be gradually decreased.  Amlodipine was resumed yesterday. She was also on hydralazine, lisinopril-HCTZ prior to admission.  These can be reintroduced gradually.   IDDM, uncontrolled with hyperglycemia -A1c 11.4 just last month Monitor CBGs.  Continue SSI.  Holding long-acting agent for now.   PAD -Will hold off cilostazol while patient on DAPT of aspirin and Brilinta   CKD stage IIIa -Euvolemic, creatinine level stable   Chronic HFpEF Stable for the most part.  Microcytic anemia No evidence of overt bleeding.  Outpatient management.  Leukocytosis noted but she is afebrile.  DVT prophylaxis: Lovenox Code Status: Full Family Communication: At bedside Disposition: SNF  Consultants:  Neurology  Procedures:  None  Antimicrobials:  Unasyn x 5 days  Subjective: Mentions that she has had multiple bowel movements over the last several 12 hours.  Denies any abdominal pain nausea or vomiting.   Objective: Vitals:   06/15/22 2022 06/15/22 2306 06/16/22 0414 06/16/22 0736  BP: (!) 173/85 (!) 165/89 (!) 190/93 (!) 189/99  Pulse: 82 89 88 87  Resp: 18 18 18 16  $ Temp: 98.7 F (37.1 C) 98.9 F (37.2 C) 99.3  F (37.4 C) 98.3 F (36.8 C)  TempSrc: Oral Oral Oral Oral  SpO2: 98% 100% 100% 97%  Weight:        Intake/Output Summary (Last 24 hours) at 06/16/2022 1019 Last data filed at 06/15/2022 2307 Gross per 24 hour  Intake 200 ml   Output 600 ml  Net -400 ml    Filed Weights   06/14/22 0339 06/15/22 0526  Weight: 83.2 kg 84.3 kg    Examination:  General appearance: Awake alert.  In no distress Resp: Clear to auscultation bilaterally.  Normal effort Cardio: S1-S2 is normal regular.  No S3-S4.  No rubs murmurs or bruit GI: Abdomen is soft.  Nontender nondistended.  Bowel sounds are present normal.  No masses organomegaly Extremities: No edema.    Data Reviewed: I have personally reviewed following labs and imaging studies  CBC: Recent Labs  Lab 06/10/22 1249 06/12/22 1328 06/13/22 0720 06/16/22 0633  WBC 9.3 7.0 6.1 14.3*  NEUTROABS 4.9  --   --   --   HGB 11.4* 11.0* 10.6* 11.5*  HCT 33.9* 32.9* 32.4* 35.0*  MCV 76.7* 78.3* 78.6* 77.4*  PLT 281 244 247 [id number]    Basic Metabolic Panel: Recent Labs  Lab 06/10/22 1249 06/12/22 1328 06/13/22 0720 06/16/22 0633  NA 137 138 142 137  K 4.1 4.1 4.1 3.8  CL 106 111 113* 105  CO2 23 21* 20* 18*  GLUCOSE 114* 104* 106* 162*  BUN 28* 24* 20 18  CREATININE 1.31* 1.30* 1.22* 1.16*  CALCIUM 9.8 9.6 9.5 9.4    GFR: Estimated Creatinine Clearance: 52.1 mL/min (A) (by C-G formula based on SCr of 1.16 mg/dL (H)).   CBG: Recent Labs  Lab 06/15/22 0605 06/15/22 1248 06/15/22 1819 06/15/22 2128 06/16/22 0627  GLUCAP 168* 137* 169* 125* 170*     Sepsis Labs: Recent Labs  Lab 06/12/22 1846 06/13/22 0720  PROCALCITON <0.10 <0.10       Radiology Studies: No results found.  Scheduled Meds:  amLODipine  10 mg Oral Daily   aspirin EC  81 mg Oral Daily   enoxaparin (LOVENOX) injection  40 mg Subcutaneous Q24H   insulin aspart  0-15 Units Subcutaneous TID WC   lidocaine  1 patch Transdermal Q24H   loperamide  4 mg Oral TID   pantoprazole  40 mg Oral QPM   saccharomyces boulardii  250 mg Oral BID   ticagrelor  90 mg Oral BID   Continuous Infusions:  ampicillin-sulbactam (UNASYN) IV 3 g (06/16/22 0651)     LOS: 4 days   [place] [facility]  If 7PM-7AM, please contact night-coverage [email or web address]  06/16/2022, 10:19 AM

## 2022-06-16 NOTE — Progress Notes (Signed)
SLP Cancellation Note  Patient Details Name: [name] MRN: [id number] DOB: [birth year]   Cancelled treatment:       Reason Eval/Treat Not Completed: Patient at procedure or test/unavailable   Pat Lakitha Gordy,M.S., CCC-SLP 06/16/2022, 2:55 PM

## 2022-06-16 NOTE — Progress Notes (Signed)
Physical Therapy Treatment Patient Details Name: [name] MRN: [id number] DOB: 07-Nov-1960 Today's Date: 06/16/2022   History of Present Illness [name] is a 62 y.o. female found to have new onset of right-sided weakness and worsening of dysarthria and drooling at [facility] hospital inpatient rehab unit on 06/12/22.  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 Patient transferred to [facility] acute care.  PMH: multiple strokes in 2022, 2023 and 2024, HTN, HLD, IDDM, PVD, non-Hodgkin's lymphoma, sickle cell trait, CKD stage IIIa    PT Comments    Pt received in supine, agreeable to therapy session and with good participation and tolerance for seated balance activity and supine/seated UE/LE strengthening exercises. Pt unable to attempt standing due to fatigue after seated activity/strengthening exercises for ~20 mins and pt needing totalA for attempted seated lateral scooting at EOB. Pt performed bed mobility with +1 maxA but needed +2 maxA for return to supine due to pt fatigue. Pt SBP elevated to 192 while seated EOB (DBP remains <100), RN notified. Pt mildly diaphoretic with exertion and c/o feeling warm but not febrile per chart review. Pt continues to benefit from PT services to progress toward functional mobility goals.   Recommendations for follow up therapy are one component of a multi-disciplinary discharge planning process, led by the attending physician.  Recommendations may be updated based on patient status, additional functional criteria and insurance authorization.  Follow Up Recommendations  Acute inpatient rehab (3hours/day)     Assistance Recommended at Discharge Frequent or constant Supervision/Assistance  Patient can return home with the following Two people to help with walking and/or transfers;Two people to help with bathing/dressing/bathroom;Assist for transportation;Help with stairs or ramp for entrance   Equipment  Recommendations  Wheelchair (measurements PT);Wheelchair cushion (measurements PT);BSC/3in1;Hospital bed    Recommendations for Other Services       Precautions / Restrictions Precautions Precautions: Fall Precaution Comments: Left Hemiparesis, Left knee buckles, support left arm in standing Restrictions Weight Bearing Restrictions: No     Mobility  Bed Mobility Overal bed mobility: Needs Assistance Bed Mobility: Rolling, Sidelying to Sit, Sit to Sidelying Rolling: Max assist (to left) Sidelying to sit: Max assist, HOB elevated     Sit to sidelying: Max assist, +2 for physical assistance General bed mobility comments: pt rolling x 3 for pericare, needs total A to roll toward her R and maxA to roll toward her L side; once legs over EOB, HOB elevated to initiate side to sit; pt unable to push up with LUE but able to use RUE cross-body on bed rail to push away from the rail. PTA assisting her to advance legs and pt totalA for anterior seated scooting to foot flat.    Transfers Overall transfer level: Needs assistance   Transfers: Bed to chair/wheelchair/BSC            Lateral/Scoot Transfers: Total assist, +2 physical assistance General transfer comment: defer standing, only +1 assist available and emphasis on seated balance/core strengthening this date. Attempted lateral seated scooting a couple times but pt unable to assist, lift hips or achieve any lateral propulsion in either direction.      Wheelchair Mobility    Modified Rankin (Stroke Patients Only) Modified Rankin (Stroke Patients Only) Pre-Morbid Rankin Score: Moderate disability Modified Rankin: Severe disability     Balance Overall balance assessment: Needs assistance Sitting-balance support: Single extremity supported, Feet supported Sitting balance-Leahy Scale: Poor Sitting balance - Comments: losing balance whichever direction she leaned today, emphasis  on using RUE on bed/her knee and core activation,  pt needing min guard intermittently but typically at least minA, up to maxA as she fatigued.       Standing balance comment: defer, lack of +2 assist and pt with impaired seated balance today so focus on seated activity                            Cognition Arousal/Alertness: Awake/alert Behavior During Therapy: WFL for tasks assessed/performed Overall Cognitive Status: Impaired/Different from baseline Area of Impairment: Problem solving                             Problem Solving: Slow processing, Decreased initiation, Requires verbal cues, Requires tactile cues, Difficulty sequencing General Comments: required incr time for all movements, decreased awareness of body positioning/balance during seated balance tasks and unable to self-correct balance quickly with her R side; some apparent motor processing delay on her R UE/LE although she reports only her L is typically weak.        Exercises General Exercises - Lower Extremity Ankle Circles/Pumps: AROM, Both, 10 reps, Supine Quad Sets: AROM, AAROM, Both, 10 reps, Supine (tactile cues for proper technique) Heel Slides: AAROM, Both, 10 reps, Supine Hip ABduction/ADduction: AAROM, Both, 10 reps, Supine (pillow squeezes and ABduction) Other Exercises Other Exercises: L gastroc stretch with pt pulling on gait belt in long sitting x2, 30 sec hold Other Exercises: seated lateral leans with elbow taps/elbow propping x5 reps with emphasis on core activation to prevent anterior LOB toward each direction Other Exercises: LUE AAROM: forearm supination, wrist and elbow flex/ext x10 reps ea Other Exercises: static midline sitting x5 mins with emphasis on using RUE PRN to maintain    General Comments General comments (skin integrity, edema, etc.): BP 172/89 (111) supine HR 71 bpm prior to transfer, then ~192/90's seated EOB (reading disappeared and PTA unable to record due to physically assisting pt), HR 80's bpm sitting. RN  notified of BP.      Pertinent Vitals/Pain Pain Assessment Pain Assessment: Faces Faces Pain Scale: Hurts a little bit Pain Location: lower back with rolling in certain postures, LLE "aching" in sidelying during hygiene assist Pain Descriptors / Indicators: Aching, Discomfort Pain Intervention(s): Monitored during session, Limited activity within patient's tolerance, Repositioned     PT Goals (current goals can now be found in the care plan section) Acute Rehab PT Goals Patient Stated Goal: to get better PT Goal Formulation: With patient Time For Goal Achievement: 06/27/22 Progress towards PT goals: Progressing toward goals    Frequency    Min 3X/week      PT Plan Current plan remains appropriate       AM-PAC PT "6 Clicks" Mobility   Outcome Measure  Help needed turning from your back to your side while in a flat bed without using bedrails?: A Lot Help needed moving from lying on your back to sitting on the side of a flat bed without using bedrails?: Total (needed HOB elevated) Help needed moving to and from a bed to a chair (including a wheelchair)?: Total Help needed standing up from a chair using your arms (e.g., wheelchair or bedside chair)?: Total Help needed to walk in hospital room?: Total Help needed climbing 3-5 steps with a railing? : Total 6 Click Score: 7    End of Session Equipment Utilized During Treatment: Gait belt Activity Tolerance: Patient tolerated treatment  well Patient left: in bed;with call bell/phone within reach;with bed alarm set;Other (comment) (semi-sidelying toward her L side, pillows between her knees for support, HOB >30* to allow her to drink water safely) Nurse Communication: Mobility status;Need for lift equipment PT Visit Diagnosis: Hemiplegia and hemiparesis;Difficulty in walking, not elsewhere classified (R26.2) Hemiplegia - Right/Left: Right Hemiplegia - dominant/non-dominant: Dominant Hemiplegia - caused by: Cerebral  infarction     Time: [id number] PT Time Calculation (min) (ACUTE ONLY): 38 min  Charges:  $Therapeutic Exercise: 8-22 mins $Therapeutic Activity: 8-22 mins $Neuromuscular Re-education: 8-22 mins                     Dashawn Golda P., PTA Acute Rehabilitation Services Secure Chat Preferred 9a-5:30pm Office: [place] 06/16/2022, 4:55 PM

## 2022-06-16 NOTE — TOC Progression Note (Signed)
Transition of Care [facility]) - Progression Note    Patient Details  Name: [name] MRN: [id number] Date of Birth: December 30, 1960  Transition of Care [facility]) CM/SW [place], [place] Phone Number: 06/16/2022, 12:51 PM  Clinical Narrative:   CSW spoke with sister, Hassan Rowan, to provide bed offers for SNF. Hassan Rowan to review and determine choice. CSW to follow.    Expected Discharge Plan: [place] Barriers to Discharge: Continued Medical Work up, Ship broker  Expected Discharge Plan and [place] Choice: [place] Living arrangements for the past 2 months: Single Family Home                                       Social Determinants of Health (SDOH) Interventions SDOH Screenings   Food Insecurity: No Food Insecurity (05/29/2022)  Housing: Low Risk  (05/29/2022)  Transportation Needs: No Transportation Needs (05/29/2022)  Utilities: Not At Risk (05/29/2022)  Depression (PHQ2-9): Low Risk  (03/16/2022)  Tobacco Use: Low Risk  (06/09/2022)    Readmission Risk Interventions     No data to display

## 2022-06-17 LAB — CBC
HCT: 33.1 % — ABNORMAL LOW (ref 36.0–46.0)
Hemoglobin: 10.7 g/dL — ABNORMAL LOW (ref 12.0–15.0)
MCH: 25.1 pg — ABNORMAL LOW (ref 26.0–34.0)
MCHC: 32.3 g/dL (ref 30.0–36.0)
MCV: 77.5 fL — ABNORMAL LOW (ref 80.0–100.0)
Platelets: 240 10*3/uL (ref 150–400)
RBC: 4.27 MIL/uL (ref 3.87–5.11)
RDW: 15.6 % — ABNORMAL HIGH (ref 11.5–15.5)
WBC: 10.1 10*3/uL (ref 4.0–10.5)
nRBC: 0 % (ref 0.0–0.2)

## 2022-06-17 LAB — COMPREHENSIVE METABOLIC PANEL
ALT: 42 U/L (ref 0–44)
AST: 27 U/L (ref 15–41)
Albumin: 2.2 g/dL — ABNORMAL LOW (ref 3.5–5.0)
Alkaline Phosphatase: 83 U/L (ref 38–126)
Anion gap: 10 (ref 5–15)
BUN: 19 mg/dL (ref 8–23)
CO2: 20 mmol/L — ABNORMAL LOW (ref 22–32)
Calcium: 9.3 mg/dL (ref 8.9–10.3)
Chloride: 107 mmol/L (ref 98–111)
Creatinine, Ser: 1.2 mg/dL — ABNORMAL HIGH (ref 0.44–1.00)
GFR, Estimated: 51 mL/min — ABNORMAL LOW (ref >60)
Glucose, Bld: 126 mg/dL — ABNORMAL HIGH (ref 70–99)
Potassium: 3.8 mmol/L (ref 3.5–5.1)
Sodium: 137 mmol/L (ref 135–145)
Total Bilirubin: 0.3 mg/dL (ref 0.3–1.2)
Total Protein: 6.1 g/dL — ABNORMAL LOW (ref 6.5–8.1)

## 2022-06-17 LAB — GLUCOSE, CAPILLARY
Glucose-Capillary: 117 mg/dL — ABNORMAL HIGH (ref 70–99)
Glucose-Capillary: 150 mg/dL — ABNORMAL HIGH (ref 70–99)

## 2022-06-17 MED ORDER — SACCHAROMYCES BOULARDII 250 MG PO CAPS: 250.0000 mg | ORAL_CAPSULE | Freq: Two times a day (BID) | ORAL | Status: DC

## 2022-06-17 MED ORDER — TICAGRELOR 90 MG PO TABS: 90.0000 mg | ORAL_TABLET | Freq: Two times a day (BID) | ORAL | Status: DC

## 2022-06-17 MED ORDER — LIDOCAINE 5 % EX PTCH: 1.0000 | MEDICATED_PATCH | CUTANEOUS | 0 refills | Status: DC

## 2022-06-17 MED ORDER — LOPERAMIDE HCL 2 MG PO CAPS: 4.0000 mg | ORAL_CAPSULE | Freq: Three times a day (TID) | ORAL | 0 refills | Status: DC | PRN

## 2022-06-17 MED ORDER — INSULIN ASPART 100 UNIT/ML IJ SOLN: 0.0000 [IU] | Freq: Three times a day (TID) | INTRAMUSCULAR | 11 refills | Status: DC

## 2022-06-17 MED ORDER — ASPIRIN 81 MG PO TBEC: 81.0000 mg | DELAYED_RELEASE_TABLET | Freq: Every day | ORAL | 12 refills | Status: AC

## 2022-06-17 NOTE — TOC Progression Note (Signed)
Transition of Care [facility]) - Progression Note    Patient Details  Name: Majesty Thind MRN: [id number] Date of Birth: [birth year]  Transition of Care [facility]) CM/SW [place], [place] Phone Number: 06/17/2022, 3:20 PM  Clinical Narrative:   CSW spoke with sister, Hassan Rowan, to discuss SNF offers. Hassan Rowan visited [facility] this morning and chose [facility] for SNF. Hassan Rowan asked about SNF coverage and any costs, and CSW provided number for patient and family to call her insurance company to get benefits regarding her specific policy. CSW confirmed bed availability with [facility] and requested that they start insurance authorization. CSW to follow.    Expected Discharge Plan: [place] Barriers to Discharge: Continued Medical Work up, Ship broker  Expected Discharge Plan and [place] Choice: [place] Living arrangements for the past 2 months: Single Family Home                                       Social Determinants of Health (SDOH) Interventions SDOH Screenings   Food Insecurity: No Food Insecurity (05/29/2022)  Housing: Low Risk  (05/29/2022)  Transportation Needs: No Transportation Needs (05/29/2022)  Utilities: Not At Risk (05/29/2022)  Depression (PHQ2-9): Low Risk  (03/16/2022)  Tobacco Use: Low Risk  (06/09/2022)    Readmission Risk Interventions     No data to display

## 2022-06-17 NOTE — Plan of Care (Signed)

## 2022-06-17 NOTE — Discharge Summary (Signed)
[facility]  Physician Discharge Summary   Patient ID: [name] MRN: [id number] DOB/AGE: 1960/06/26 62 y.o.  Admit date: 06/12/2022 Discharge date:   06/17/2022   PCP: [name], [name], MD  DISCHARGE DIAGNOSES:    Acute CVA (cerebrovascular accident) [facility])   Dysarthria Aspiration pneumonitis Diarrhea, resolved Essential hypertension Insulin-dependent diabetes mellitus Peripheral artery disease Chronic diastolic CHF Microcytic anemia   RECOMMENDATIONS FOR OUTPATIENT FOLLOW UP: Please check CBC and basic metabolic panel in 3 to 4 days Ambulatory referral has been sent to neurology for outpatient follow-up   Home Health: Needs SNF Equipment/Devices: None  CODE STATUS: Full code  DISCHARGE CONDITION: fair  Diet recommendation: Modified carbohydrate with thin liquids  INITIAL HISTORY: [name] is a 62 y.o. female with medical history significant of multiple strokes in 2022, 2023 and 2024, HTN, HLD, IDDM, PVD, non-Hodgkin's lymphoma, sickle cell trait, CKD stage IIIa, was found to have new onset of right-sided weakness and worsening of dysarthria and drooling at [facility] hospital inpatient rehab unit.   Patient admitted 2 weeks ago secondary to new onset of right medulla stroke,subsequently discharged to inpt rehab - L sided weakness/dysarthria noted at time of discharge. Staff at rehab on 06/12/22 noted seemingly new weakness of right arm and hand, when patient was unable to hold anything on her right hand as well as worsening of slurred speech as well as new left-sided drooling.  Neurology consulted, given that exact onset of her symptoms is unknown, decision made to not give TNK.  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 Admitted to hospitalist service with neurology consult.  Consultations: Neurology    HOSPITAL COURSE:   Acute extension of previous stroke, with new onset of right-sided paresis, dysphagia  and facial droop -MRI confirms "Increased size of a recent right medullary infarct which now extends to the left of midline.New punctate acute left cerebellar infarct." -Neurology following, recommending aspirin and Brilinta for 3 months followed by aspirin alone.  If there is difficulty in affording Brilinta then consider switching to aspirin and Plavix after 4 weeks. Initially there was concern about her ability to take medications orally but looks like her oral intake has improved. Initially there was plan for the patient to go back to CIR but does not appear that that is an option currently.  She needs to go to skilled nursing facility for rehab.   Probable aspiration pneumonitis Pneumonia less likely but cannot be ruled out -Secondary to above presumed worsening dysphagia - Given acuity unlikely to be infectious rather than reactive inflammatory/pneumonitis. Patient was started on Unasyn.  However patient started experiencing diarrhea.  Since her respiratory status was otherwise stable Unasyn was discontinued.  Procalcitonin was unremarkable.    Acute diarrhea Most likely due to antibiotics.  Abdomen is benign.  She was given Imodium and probiotics.  Antibiotics were discontinued.  Diarrhea has resolved.  Abdomen remains benign.  WBC is normal.   Essential hypertension Initially allowed permissive hypertension.  Blood pressure to be gradually decreased.  Amlodipine was resumed.  Can also resume hydralazine at discharge.  Lisinopril can be reinitiated depending on blood pressures and blood work over the next 3 to 4 days.   IDDM, uncontrolled with hyperglycemia -A1c 11.4 just last month BG's.  Continue just SSI for now.  Glargine can be reintroduced depending on glucose levels.     PAD -Will hold off cilostazol while patient on DAPT of aspirin and Brilinta   CKD stage IIIa -Euvolemic,  creatinine level stable   Chronic HFpEF Stable for the most part.   Microcytic anemia No evidence  of overt bleeding.  Outpatient management.    Obesity Estimated body mass index is 31.9 kg/m as calculated from the following:   Height as of 06/04/22: 5' 4"$  (1.626 m).   Weight as of this encounter: 84.3 kg.  Patient is stable.  Okay for discharge to SNF when bed is available.   PERTINENT LABS:  The results of significant diagnostics from this hospitalization (including imaging, microbiology, ancillary and laboratory) are listed below for reference.    Labs:   Basic Metabolic Panel: Recent Labs  Lab 06/10/22 1249 06/12/22 1328 06/13/22 0720 06/16/22 0633 06/17/22 0702  NA 137 138 142 137 137  K 4.1 4.1 4.1 3.8 3.8  CL 106 111 113* 105 107  CO2 23 21* 20* 18* 20*  GLUCOSE 114* 104* 106* 162* 126*  BUN 28* 24* 20 18 19  $ CREATININE 1.31* 1.30* 1.22* 1.16* 1.20*  CALCIUM 9.8 9.6 9.5 9.4 9.3   Liver Function Tests: Recent Labs  Lab 06/17/22 0702  AST 27  ALT 42  ALKPHOS 83  BILITOT 0.3  PROT 6.1*  ALBUMIN 2.2*    CBC: Recent Labs  Lab 06/10/22 1249 06/12/22 1328 06/13/22 0720 06/16/22 0633 06/17/22 0702  WBC 9.3 7.0 6.1 14.3* 10.1  NEUTROABS 4.9  --   --   --   --   HGB 11.4* 11.0* 10.6* 11.5* 10.7*  HCT 33.9* 32.9* 32.4* 35.0* 33.1*  MCV 76.7* 78.3* 78.6* 77.4* 77.5*  PLT 281 244 247 271 240     CBG: Recent Labs  Lab 06/16/22 0627 06/16/22 1223 06/16/22 1601 06/16/22 2157 06/17/22 0612  GLUCAP 170* 147* 117* 153* 117*     IMAGING STUDIES DG Chest 1 View  Result Date: 06/13/2022 CLINICAL DATA:  Aspiration pneumonia EXAM: CHEST  1 VIEW COMPARISON:  Radiograph 06/12/2022 FINDINGS: Unchanged cardiomediastinal silhouette. Bandlike atelectasis in the lower lungs. No new airspace disease. No pleural effusion or evidence of pneumothorax. Bones are unchanged. IMPRESSION: No new airspace disease. Electronically Signed   By: Maurine Simmering M.D.   On: 06/13/2022 07:58   DG Chest 1 View  Result Date: 06/12/2022 CLINICAL DATA:  Pneumonia EXAM: CHEST  1  VIEW COMPARISON:  Chest x-ray 05/29/2022 FINDINGS: Cardiomediastinal silhouette is within normal limits. There is a linear band of atelectasis or scarring in the left mid lung similar to prior. Lungs are otherwise clear. No pleural effusion or pneumothorax. No acute fractures. IMPRESSION: No acute cardiopulmonary process.  No significant interval change. Electronically Signed   By: Ronney Asters M.D.   On: 06/12/2022 16:17   MR BRAIN WO CONTRAST  Result Date: 06/12/2022 CLINICAL DATA:  Neuro deficit, acute, stroke suspected. New right sided weakness, worsening aphasia. EXAM: MRI HEAD WITHOUT CONTRAST TECHNIQUE: Multiplanar, multiecho pulse sequences of the brain and surrounding structures were obtained without intravenous contrast. COMPARISON:  Head CT 06/11/2022 and MRI/MRA 05/30/2022 FINDINGS: Multiple sequences are up to moderately motion degraded. Brain: The acute infarct in the right ventral medulla on the prior MRI has enlarged and now extends to the left of midline. There is also a new punctate acute infarct in the left cerebellar hemisphere. Small T2 hyperintensities in the cerebral white matter bilaterally are unchanged and nonspecific but compatible with mild chronic small vessel ischemic disease chronic microhemorrhages are again noted in the right thalamus and right parietal white matter. Small chronic bilateral cerebellar infarcts are again noted with hemosiderin  deposition associated with 1 of the left-sided infarcts. The ventricles and sulci are normal. No mass, midline shift, or extra-axial fluid collection is identified. Vascular: Unchanged abnormal appearance of the distal right vertebral artery, more fully evaluated on the prior MRA. Skull and upper cervical spine: No suspicious marrow lesion. Sinuses/Orbits: Unremarkable orbits. Mucous retention cyst in the right maxillary sinus. Small left mastoid effusion. Other: Partially visualized bilateral cervical lymphadenopathy with history of  lymphoma. IMPRESSION: 1. Increased size of a recent right medullary infarct which now extends to the left of midline. 2. New punctate acute left cerebellar infarct. 3. Mild chronic small vessel ischemic disease. Chronic cerebellar infarcts. These results will be called to the ordering clinician or representative by the Radiologist Assistant, and communication documented in the PACS or [employer]. Electronically Signed   By: Logan Bores M.D.   On: 06/12/2022 13:24   CT HEAD WO CONTRAST (5MM)  Result Date: 06/11/2022 CLINICAL DATA:  Follow-up examination for stroke, slurred speech. EXAM: CT HEAD WITHOUT CONTRAST TECHNIQUE: Contiguous axial images were obtained from the base of the skull through the vertex without intravenous contrast. RADIATION DOSE REDUCTION: This exam was performed according to the departmental dose-optimization program which includes automated exposure control, adjustment of the mA and/or kV according to patient size and/or use of iterative reconstruction technique. COMPARISON:  Comparison made with prior CT and MRI from 05/30/2022. FINDINGS: Brain: Cerebral volume within normal limits for age. Mild chronic small vessel ischemic disease noted. Previously identified small medullary infarct not visible by CT. No other acute large vessel territory infarct. No intracranial hemorrhage. No mass lesion or midline shift. No hydrocephalus or extra-axial fluid collection. Vascular: No abnormal hyperdense vessel. Calcified atherosclerosis present about the skull base. Skull: Scalp soft tissues and calvarium within normal limits. Sinuses/Orbits: Globes and orbital soft tissues demonstrate no acute finding. Paranasal sinuses remain largely clear. No mastoid effusion. Other: None. IMPRESSION: 1. No acute intracranial abnormality. 2. Mild chronic small vessel ischemic disease. Previously identified small medullary infarct not visible by CT. Electronically Signed   By: Jeannine Boga M.D.   On:  06/11/2022 21:07   ECHOCARDIOGRAM COMPLETE  Result Date: 05/31/2022    ECHOCARDIOGRAM REPORT   Patient Name:   [name] Date of Exam: 05/31/2022 Medical Rec #:  [id number]       Height:       64.0 in Accession #:    [id number]      Weight:       179.0 lb Date of Birth:  Jan 06, 1961       BSA:          1.866 m Patient Age:    16 years        BP:           168/93 mmHg Patient Gender: F               HR:           72 bpm. Exam Location:  Inpatient Procedure: 2D Echo, Cardiac Doppler and Color Doppler Indications:    Stroke  History:        Patient has prior history of Echocardiogram examinations.                 Stroke; Risk Factors:Hypertension, Diabetes and Dyslipidemia.  Sonographer:    Phineas Douglas Referring Phys: [phone] AMRIT ADHIKARI IMPRESSIONS  1. Left ventricular ejection fraction, by estimation, is 60 to 65%. The left ventricle has normal function. The left ventricle has no regional wall  motion abnormalities. There is mild concentric left ventricular hypertrophy. Left ventricular diastolic parameters are consistent with Grade I diastolic dysfunction (impaired relaxation).  2. Right ventricular systolic function is normal. The right ventricular size is normal. Tricuspid regurgitation signal is inadequate for assessing PA pressure.  3. The mitral valve is grossly normal. No evidence of mitral valve regurgitation. No evidence of mitral stenosis.  4. The aortic valve is tricuspid. Aortic valve regurgitation is not visualized. No aortic stenosis is present.  5. The inferior vena cava is normal in size with greater than 50% respiratory variability, suggesting right atrial pressure of 3 mmHg. Conclusion(s)/Recommendation(s): No intracardiac source of embolism detected on this transthoracic study. Consider a transesophageal echocardiogram to exclude cardiac source of embolism if clinically indicated. FINDINGS  Left Ventricle: Left ventricular ejection fraction, by estimation, is 60 to 65%. The left  ventricle has normal function. The left ventricle has no regional wall motion abnormalities. The left ventricular internal cavity size was normal in size. There is  mild concentric left ventricular hypertrophy. Left ventricular diastolic parameters are consistent with Grade I diastolic dysfunction (impaired relaxation). Right Ventricle: The right ventricular size is normal. No increase in right ventricular wall thickness. Right ventricular systolic function is normal. Tricuspid regurgitation signal is inadequate for assessing PA pressure. Left Atrium: Left atrial size was normal in size. Right Atrium: Right atrial size was normal in size. Pericardium: Trivial pericardial effusion is present. Mitral Valve: The mitral valve is grossly normal. No evidence of mitral valve regurgitation. No evidence of mitral valve stenosis. Tricuspid Valve: The tricuspid valve is grossly normal. Tricuspid valve regurgitation is trivial. No evidence of tricuspid stenosis. Aortic Valve: The aortic valve is tricuspid. Aortic valve regurgitation is not visualized. No aortic stenosis is present. Pulmonic Valve: The pulmonic valve was grossly normal. Pulmonic valve regurgitation is trivial. No evidence of pulmonic stenosis. Aorta: The aortic root and ascending aorta are structurally normal, with no evidence of dilitation. Venous: The inferior vena cava is normal in size with greater than 50% respiratory variability, suggesting right atrial pressure of 3 mmHg. IAS/Shunts: The atrial septum is grossly normal.  LEFT VENTRICLE PLAX 2D LVIDd:         3.60 cm      Diastology LVIDs:         2.30 cm      LV e' medial:    5.68 cm/s LV PW:         1.30 cm      LV E/e' medial:  12.4 LV IVS:        1.20 cm      LV e' lateral:   7.22 cm/s LVOT diam:     2.00 cm      LV E/e' lateral: 9.8 LV SV:         69 LV SV Index:   37 LVOT Area:     3.14 cm  LV Volumes (MOD) LV vol d, MOD A2C: 119.0 ml LV vol d, MOD A4C: 111.0 ml LV vol s, MOD A2C: 44.0 ml LV vol s,  MOD A4C: 48.0 ml LV SV MOD A2C:     75.0 ml LV SV MOD A4C:     111.0 ml LV SV MOD BP:      68.6 ml RIGHT VENTRICLE             IVC RV Basal diam:  3.10 cm     IVC diam: 1.40 cm RV S prime:     13.90 cm/s TAPSE (M-mode): 1.8  cm LEFT ATRIUM             Index        RIGHT ATRIUM           Index LA diam:        3.70 cm 1.98 cm/m   RA Area:     12.00 cm LA Vol (A2C):   51.4 ml 27.54 ml/m  RA Volume:   23.00 ml  12.32 ml/m LA Vol (A4C):   56.6 ml 30.33 ml/m LA Biplane Vol: 58.1 ml 31.13 ml/m  AORTIC VALVE LVOT Vmax:   93.20 cm/s LVOT Vmean:  62.800 cm/s LVOT VTI:    0.220 m  AORTA Ao Root diam: 3.10 cm Ao Asc diam:  3.20 cm MITRAL VALVE MV Area (PHT): 2.77 cm    SHUNTS MV Decel Time: 274 msec    Systemic VTI:  0.22 m MV E velocity: 70.70 cm/s  Systemic Diam: 2.00 cm MV A velocity: 95.50 cm/s MV E/A ratio:  0.74 Eleonore Chiquito MD Electronically signed by Eleonore Chiquito MD Signature Date/Time: 05/31/2022/9:44:38 AM    Final    CT HEAD WO CONTRAST (5MM)  Result Date: 05/30/2022 CLINICAL DATA:  Follow-up stroke EXAM: CT HEAD WITHOUT CONTRAST TECHNIQUE: Contiguous axial images were obtained from the base of the skull through the vertex without intravenous contrast. RADIATION DOSE REDUCTION: This exam was performed according to the departmental dose-optimization program which includes automated exposure control, adjustment of the mA and/or kV according to patient size and/or use of iterative reconstruction technique. COMPARISON:  MRI brain dated 05/30/2022 at 1031 hours FINDINGS: Brain: No evidence of acute infarction, hemorrhage, hydrocephalus, extra-axial collection or mass lesion/mass effect. Known tiny acute infarct within the right medulla is not evident on current CT. Mild subcortical white matter and periventricular small vessel ischemic changes. Vascular: Intracranial atherosclerosis. Skull: Normal. Negative for fracture or focal lesion. Sinuses/Orbits: The visualized paranasal sinuses are essentially clear. The  mastoid air cells are unopacified. Other: None. IMPRESSION: Known tiny acute infarct within the right medulla is not evident on current CT. Mild small vessel ischemic changes. Electronically Signed   By: Julian Hy M.D.   On: 05/30/2022 23:55   MR ANGIO HEAD WO CONTRAST  Result Date: 05/30/2022 CLINICAL DATA:  History of non-Hodgkin's lymphoma, hypertension, Guillain-Barre syndrome, and multiple strokes. Complains of dizziness and overall weakness. Possible CVA. EXAM: MRI HEAD WITHOUT CONTRAST MRA HEAD WITHOUT CONTRAST MRA NECK WITHOUT AND WITH CONTRAST TECHNIQUE: Multiplanar, multi-echo pulse sequences of the brain and surrounding structures were acquired without intravenous contrast. Angiographic images of the Circle of Willis were acquired using MRA technique without intravenous contrast. Angiographic images of the neck were acquired using MRA technique without and with intravenous contrast. Carotid stenosis measurements (when applicable) are obtained utilizing NASCET criteria, using the distal internal carotid diameter as the denominator. CONTRAST:  52m GADAVIST GADOBUTROL 1 MMOL/ML IV SOLN COMPARISON:  None Available. CT head 1 day prior, CTA head/neck 09/15/2021 FINDINGS: MRI HEAD FINDINGS Brain: There is diffusion restriction in the right aspect of the medulla with faint associated FLAIR signal abnormality consistent with acute infarct (5-9). There is no associated hemorrhage or mass effect. There is no other evidence of acute infarct. There is no acute intracranial hemorrhage or extra-axial fluid collection. Parenchymal volume is normal. The ventricles are normal in size. Scattered foci of FLAIR signal abnormality in the supratentorial white matter are nonspecific but likely reflects sequela of underlying chronic small-vessel ischemic change, similar to the MRI from 2023. There are  small remote infarcts in the bilateral cerebellar hemispheres, at least 1 of which in the left cerebellar hemisphere  is new since 09/15/2021. There are punctate chronic microhemorrhages in the right thalamus and right parietal white matter, nonspecific but possibly hypertensive. The pituitary and suprasellar region are normal. There is no mass lesion. There is no mass effect or midline shift. Vascular: See below. Skull and upper cervical spine: Normal marrow signal. Sinuses/Orbits: The paranasal sinuses are clear. The globes and orbits are unremarkable. Other: There are prominent lymph nodes in the imaged upper neck on the coronal diffusion sequence which appear overall decreased in size since the prior MRI from 2023 though are incompletely evaluated. MRA HEAD FINDINGS Anterior circulation: There is atherosclerotic irregularity of the carotid siphons resulting in moderate stenosis of the cavernous segment on the left and mild-to-moderate stenosis of the supraclinoid segment on the right. The left M1 segment is patent. There is moderate to severe stenosis of the origin of the superior left M2 division (7-156). The distal left MCA branches appear patent. There is severe stenosis of the distal right M1 segment (1036-3). There is multifocal moderate narrowing of the proximal right superior M2 division. The distal right MCA branches otherwise appear patent. The bilateral ACAs are patent, without proximal stenosis or occlusion. The anterior communicating artery is not definitely identified. There is no aneurysm or AVM. Posterior circulation: There is no flow related enhancement of the right V4 segment on the time-of-flight MRA head. There is intermittent enhancement on the postcontrast MRA neck images. Findings are similar to the CTA from 2023. The PICA origin is not seen on the right. There is multifocal severe stenosis/near occlusion of the left V4 segment, also similar to the prior CT. PICA is identified on the left. The basilar artery is patent with atherosclerotic irregularity and narrowing. There is severe stenosis of the origin  of the left P1 segment. There is multifocal atherosclerotic irregularity and narrowing in the remainder of the left PCA without other high-grade stenosis or occlusion. The right PCA is patent with mild atherosclerotic irregularity but no high-grade stenosis or occlusion. Findings are similar to the prior CTA. There is no aneurysm or AVM. Anatomic variants: None. MRA NECK FINDINGS Aortic arch: The imaged aortic arch is normal. The origins of the major branch vessels are patent. The subclavian arteries are patent to the level imaged. 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 Right carotid system: The right common, internal, and external carotid arteries are patent, with mild irregularity at the bifurcation but no hemodynamically significant stenosis or occlusion. There is no evidence of dissection or aneurysm. Left carotid system: The left common, internal, and external carotid arteries are patent, without significant stenosis or occlusion. There is no evidence of dissection or aneurysm. Vertebral arteries: The right vertebral artery appears patent at its origin. There is short-segment high-grade stenosis/occlusion of the distal V2 segment (15-60). There is diminutive reconstitution with further occlusion of the V3 segment. There is moderate stenosis of the left vertebral artery origin (15-52). The left vertebral artery is otherwise patent in the neck without other hemodynamically significant stenosis or occlusion. There is no evidence of dissection or aneurysm. There is antegrade flow in both vertebral arteries. Other: None IMPRESSION: MR HEAD 1. Small acute infarct in the right aspect of the medulla without hemorrhage or mass effect. 2. Background chronic small-vessel ischemic change with small remote infarcts in the bilateral cerebellar hemispheres, one of which on the left is new since 09/15/2021. 3. Prominent  lymph nodes in the upper neck consistent with the history of lymphoma, overall  decreased in size since the prior studies from 09/15/2021 but incompletely evaluated. MRA HEAD 1. Diminutive enhancement of the right V4 segment with intermittent occlusion, and multifocal severe stenosis/near occlusion of the left V4 segment, similar to the prior CTA. 2. Additional intracranial atherosclerotic disease as detailed above resulting in moderate stenosis of the left cavernous ICA, mild-to-moderate stenosis of the right supraclinoid ICA, severe stenosis of the origin of the left superior M2 division, severe stenosis of the distal right M1 segment, and severe stenosis of the proximal left P1 segment. MRA NECK 1. 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 2. Moderate stenosis of the origin of the left vertebral artery, not significantly changed. 3. Patent carotid systems bilaterally. Electronically Signed   By: Valetta Mole M.D.   On: 05/30/2022 12:01   MR BRAIN WO CONTRAST  Result Date: 05/30/2022 CLINICAL DATA:  History of non-Hodgkin's lymphoma, hypertension, Guillain-Barre syndrome, and multiple strokes. Complains of dizziness and overall weakness. Possible CVA. EXAM: MRI HEAD WITHOUT CONTRAST MRA HEAD WITHOUT CONTRAST MRA NECK WITHOUT AND WITH CONTRAST TECHNIQUE: Multiplanar, multi-echo pulse sequences of the brain and surrounding structures were acquired without intravenous contrast. Angiographic images of the Circle of Willis were acquired using MRA technique without intravenous contrast. Angiographic images of the neck were acquired using MRA technique without and with intravenous contrast. Carotid stenosis measurements (when applicable) are obtained utilizing NASCET criteria, using the distal internal carotid diameter as the denominator. CONTRAST:  77m GADAVIST GADOBUTROL 1 MMOL/ML IV SOLN COMPARISON:  None Available. CT head 1 day prior, CTA head/neck 09/15/2021 FINDINGS: MRI HEAD FINDINGS Brain: There is diffusion restriction in the right aspect of  the medulla with faint associated FLAIR signal abnormality consistent with acute infarct (5-9). There is no associated hemorrhage or mass effect. There is no other evidence of acute infarct. There is no acute intracranial hemorrhage or extra-axial fluid collection. Parenchymal volume is normal. The ventricles are normal in size. Scattered foci of FLAIR signal abnormality in the supratentorial white matter are nonspecific but likely reflects sequela of underlying chronic small-vessel ischemic change, similar to the MRI from 2023. There are small remote infarcts in the bilateral cerebellar hemispheres, at least 1 of which in the left cerebellar hemisphere is new since 09/15/2021. There are punctate chronic microhemorrhages in the right thalamus and right parietal white matter, nonspecific but possibly hypertensive. The pituitary and suprasellar region are normal. There is no mass lesion. There is no mass effect or midline shift. Vascular: See below. Skull and upper cervical spine: Normal marrow signal. Sinuses/Orbits: The paranasal sinuses are clear. The globes and orbits are unremarkable. Other: There are prominent lymph nodes in the imaged upper neck on the coronal diffusion sequence which appear overall decreased in size since the prior MRI from 2023 though are incompletely evaluated. MRA HEAD FINDINGS Anterior circulation: There is atherosclerotic irregularity of the carotid siphons resulting in moderate stenosis of the cavernous segment on the left and mild-to-moderate stenosis of the supraclinoid segment on the right. The left M1 segment is patent. There is moderate to severe stenosis of the origin of the superior left M2 division (7-156). The distal left MCA branches appear patent. There is severe stenosis of the distal right M1 segment (1036-3). There is multifocal moderate narrowing of the proximal right superior M2 division. The distal right MCA branches otherwise appear patent. The bilateral ACAs are  patent, without proximal  stenosis or occlusion. The anterior communicating artery is not definitely identified. There is no aneurysm or AVM. Posterior circulation: There is no flow related enhancement of the right V4 segment on the time-of-flight MRA head. There is intermittent enhancement on the postcontrast MRA neck images. Findings are similar to the CTA from 2023. The PICA origin is not seen on the right. There is multifocal severe stenosis/near occlusion of the left V4 segment, also similar to the prior CT. PICA is identified on the left. The basilar artery is patent with atherosclerotic irregularity and narrowing. There is severe stenosis of the origin of the left P1 segment. There is multifocal atherosclerotic irregularity and narrowing in the remainder of the left PCA without other high-grade stenosis or occlusion. The right PCA is patent with mild atherosclerotic irregularity but no high-grade stenosis or occlusion. Findings are similar to the prior CTA. There is no aneurysm or AVM. Anatomic variants: None. MRA NECK FINDINGS Aortic arch: The imaged aortic arch is normal. The origins of the major branch vessels are patent. The subclavian arteries are patent to the level imaged. 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 Right carotid system: The right common, internal, and external carotid arteries are patent, with mild irregularity at the bifurcation but no hemodynamically significant stenosis or occlusion. There is no evidence of dissection or aneurysm. Left carotid system: The left common, internal, and external carotid arteries are patent, without significant stenosis or occlusion. There is no evidence of dissection or aneurysm. Vertebral arteries: The right vertebral artery appears patent at its origin. There is short-segment high-grade stenosis/occlusion of the distal V2 segment (15-60). There is diminutive reconstitution with further occlusion of the V3 segment. There is moderate  stenosis of the left vertebral artery origin (15-52). The left vertebral artery is otherwise patent in the neck without other hemodynamically significant stenosis or occlusion. There is no evidence of dissection or aneurysm. There is antegrade flow in both vertebral arteries. Other: None IMPRESSION: MR HEAD 1. Small acute infarct in the right aspect of the medulla without hemorrhage or mass effect. 2. Background chronic small-vessel ischemic change with small remote infarcts in the bilateral cerebellar hemispheres, one of which on the left is new since 09/15/2021. 3. Prominent lymph nodes in the upper neck consistent with the history of lymphoma, overall decreased in size since the prior studies from 09/15/2021 but incompletely evaluated. MRA HEAD 1. Diminutive enhancement of the right V4 segment with intermittent occlusion, and multifocal severe stenosis/near occlusion of the left V4 segment, similar to the prior CTA. 2. Additional intracranial atherosclerotic disease as detailed above resulting in moderate stenosis of the left cavernous ICA, mild-to-moderate stenosis of the right supraclinoid ICA, severe stenosis of the origin of the left superior M2 division, severe stenosis of the distal right M1 segment, and severe stenosis of the proximal left P1 segment. MRA NECK 1. 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 2. Moderate stenosis of the origin of the left vertebral artery, not significantly changed. 3. Patent carotid systems bilaterally. Electronically Signed   By: Valetta Mole M.D.   On: 05/30/2022 12:01   MR ANGIO NECK W WO CONTRAST  Result Date: 05/30/2022 CLINICAL DATA:  History of non-Hodgkin's lymphoma, hypertension, Guillain-Barre syndrome, and multiple strokes. Complains of dizziness and overall weakness. Possible CVA. EXAM: MRI HEAD WITHOUT CONTRAST MRA HEAD WITHOUT CONTRAST MRA NECK WITHOUT AND WITH CONTRAST TECHNIQUE: Multiplanar, multi-echo pulse  sequences of the brain and surrounding structures were acquired  without intravenous contrast. Angiographic images of the Circle of Willis were acquired using MRA technique without intravenous contrast. Angiographic images of the neck were acquired using MRA technique without and with intravenous contrast. Carotid stenosis measurements (when applicable) are obtained utilizing NASCET criteria, using the distal internal carotid diameter as the denominator. CONTRAST:  83m GADAVIST GADOBUTROL 1 MMOL/ML IV SOLN COMPARISON:  None Available. CT head 1 day prior, CTA head/neck 09/15/2021 FINDINGS: MRI HEAD FINDINGS Brain: There is diffusion restriction in the right aspect of the medulla with faint associated FLAIR signal abnormality consistent with acute infarct (5-9). There is no associated hemorrhage or mass effect. There is no other evidence of acute infarct. There is no acute intracranial hemorrhage or extra-axial fluid collection. Parenchymal volume is normal. The ventricles are normal in size. Scattered foci of FLAIR signal abnormality in the supratentorial white matter are nonspecific but likely reflects sequela of underlying chronic small-vessel ischemic change, similar to the MRI from 2023. There are small remote infarcts in the bilateral cerebellar hemispheres, at least 1 of which in the left cerebellar hemisphere is new since 09/15/2021. There are punctate chronic microhemorrhages in the right thalamus and right parietal white matter, nonspecific but possibly hypertensive. The pituitary and suprasellar region are normal. There is no mass lesion. There is no mass effect or midline shift. Vascular: See below. Skull and upper cervical spine: Normal marrow signal. Sinuses/Orbits: The paranasal sinuses are clear. The globes and orbits are unremarkable. Other: There are prominent lymph nodes in the imaged upper neck on the coronal diffusion sequence which appear overall decreased in size since the prior MRI from 2023  though are incompletely evaluated. MRA HEAD FINDINGS Anterior circulation: There is atherosclerotic irregularity of the carotid siphons resulting in moderate stenosis of the cavernous segment on the left and mild-to-moderate stenosis of the supraclinoid segment on the right. The left M1 segment is patent. There is moderate to severe stenosis of the origin of the superior left M2 division (7-156). The distal left MCA branches appear patent. There is severe stenosis of the distal right M1 segment (1036-3). There is multifocal moderate narrowing of the proximal right superior M2 division. The distal right MCA branches otherwise appear patent. The bilateral ACAs are patent, without proximal stenosis or occlusion. The anterior communicating artery is not definitely identified. There is no aneurysm or AVM. Posterior circulation: There is no flow related enhancement of the right V4 segment on the time-of-flight MRA head. There is intermittent enhancement on the postcontrast MRA neck images. Findings are similar to the CTA from 2023. The PICA origin is not seen on the right. There is multifocal severe stenosis/near occlusion of the left V4 segment, also similar to the prior CT. PICA is identified on the left. The basilar artery is patent with atherosclerotic irregularity and narrowing. There is severe stenosis of the origin of the left P1 segment. There is multifocal atherosclerotic irregularity and narrowing in the remainder of the left PCA without other high-grade stenosis or occlusion. The right PCA is patent with mild atherosclerotic irregularity but no high-grade stenosis or occlusion. Findings are similar to the prior CTA. There is no aneurysm or AVM. Anatomic variants: None. MRA NECK FINDINGS Aortic arch: The imaged aortic arch is normal. The origins of the major branch vessels are patent. The subclavian arteries are patent to the level imaged. 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 Right  carotid system: The right common, internal, and external carotid arteries are patent, with mild irregularity at the  bifurcation but no hemodynamically significant stenosis or occlusion. There is no evidence of dissection or aneurysm. Left carotid system: The left common, internal, and external carotid arteries are patent, without significant stenosis or occlusion. There is no evidence of dissection or aneurysm. Vertebral arteries: The right vertebral artery appears patent at its origin. There is short-segment high-grade stenosis/occlusion of the distal V2 segment (15-60). There is diminutive reconstitution with further occlusion of the V3 segment. There is moderate stenosis of the left vertebral artery origin (15-52). The left vertebral artery is otherwise patent in the neck without other hemodynamically significant stenosis or occlusion. There is no evidence of dissection or aneurysm. There is antegrade flow in both vertebral arteries. Other: None IMPRESSION: MR HEAD 1. Small acute infarct in the right aspect of the medulla without hemorrhage or mass effect. 2. Background chronic small-vessel ischemic change with small remote infarcts in the bilateral cerebellar hemispheres, one of which on the left is new since 09/15/2021. 3. Prominent lymph nodes in the upper neck consistent with the history of lymphoma, overall decreased in size since the prior studies from 09/15/2021 but incompletely evaluated. MRA HEAD 1. Diminutive enhancement of the right V4 segment with intermittent occlusion, and multifocal severe stenosis/near occlusion of the left V4 segment, similar to the prior CTA. 2. Additional intracranial atherosclerotic disease as detailed above resulting in moderate stenosis of the left cavernous ICA, mild-to-moderate stenosis of the right supraclinoid ICA, severe stenosis of the origin of the left superior M2 division, severe stenosis of the distal right M1 segment, and severe stenosis of the proximal left P1  segment. MRA NECK 1. 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 2. Moderate stenosis of the origin of the left vertebral artery, not significantly changed. 3. Patent carotid systems bilaterally. Electronically Signed   By: Valetta Mole M.D.   On: 05/30/2022 12:01   CT Angio Chest/Abd/Pel for Dissection W and/or W/WO  Result Date: 05/29/2022 CLINICAL DATA:  numbness, tingling ,dizziness, hypertensive Radiologic records indicates history of CLL. EXAM: CT ANGIOGRAPHY CHEST, ABDOMEN AND PELVIS TECHNIQUE: Non-contrast CT of the chest was initially obtained. Multidetector CT imaging through the chest, abdomen and pelvis was performed using the standard protocol during bolus administration of intravenous contrast. Multiplanar reconstructed images and MIPs were obtained and reviewed to evaluate the vascular anatomy. RADIATION DOSE REDUCTION: This exam was performed according to the departmental dose-optimization program which includes automated exposure control, adjustment of the mA and/or kV according to patient size and/or use of iterative reconstruction technique. CONTRAST:  42m OMNIPAQUE IOHEXOL 350 MG/ML SOLN COMPARISON:  Chest radiograph earlier today. Chest, abdomen, pelvis CT 10/15/2019 FINDINGS: CTA CHEST FINDINGS Cardiovascular: No aortic hematoma on this unenhanced exam. Thoracic aorta is normal in caliber with mild tortuosity. Mild aortic atherosclerosis. No dissection, aneurysm or acute aortic findings. Aberrant right subclavian artery. No central pulmonary embolus on this exam not tailored to pulmonary artery assessment. Borderline cardiomegaly. Minimal pericardial effusion. Mediastinum/Nodes: No enlarged mediastinal or hilar lymph nodes. There is bulky bilateral axillary adenopathy. Index right axillary node measures 2.1 cm short axis series 4, image 21. Left axillary node measures 2.5 cm short axis series 4, image 33. 11 mm left supraclavicular node is stable.  Similar appearance of heterogeneous right lobe of the thyroid. No esophageal wall thickening. Lungs/Pleura: Minor atelectasis in the lingula. No other focal airspace disease. No pleural fluid. No features of pulmonary edema. No pulmonary mass or nodule. The trachea and central airways are patent. Musculoskeletal: Heterogeneous  appearance of the osseous structures but no discrete focal lesion. No acute osseous findings. Review of the MIP images confirms the above findings. CTA ABDOMEN AND PELVIS FINDINGS VASCULAR Aorta: Moderate atherosclerosis. Normal in caliber without aneurysm, dissection, vasculitis or significant stenosis. Celiac: Mild plaque at the origin with minimal stenosis. Branch vessels are patent. No dissection or acute findings. SMA: Patent without evidence of aneurysm, dissection, vasculitis or significant stenosis. Renals: Single bilateral renal arteries. Mild calcified plaque at the origin of the both renal arteries with minimal stenosis. No dissection or acute findings. IMA: Patent without evidence of aneurysm, dissection, vasculitis or significant stenosis. Inflow: Moderate atherosclerosis. No aneurysm, dissection or acute findings. Veins: No obvious venous abnormality within the limitations of this arterial phase study. Review of the MIP images confirms the above findings. NON-VASCULAR Hepatobiliary: No focal hepatic abnormality on this arterial phase exam. Or gallbladder Pancreas: No ductal dilatation or inflammation. Spleen: Normal in size and arterial enhancement. Adrenals/Urinary Tract: No adrenal nodule. Heterogeneous enhancement of both kidneys with mild perinephric edema. Loss of corticomedullary differentiation on the right. No frank hydronephrosis. 4.3 cm mildly complex cyst arising from the lower pole of the left kidney, suboptimally assessed on this arterial phase exam. Thin internal septations are seen. Mild bladder distension but no wall thickening. Stomach/Bowel: No bowel obstruction  or inflammation. Scattered colonic diverticula without diverticulitis. Normal appendix. Lymphatic: Retroperitoneal adenopathy with progression from prior exam. Dominant node is at the right common iliac station measuring 2.9 cm short axis series 4, image 143. This node previously measured 2.5 cm. Lymph node at the level of the upper right kidney measures 18 mm short axis series 4, image 88. Anterior periaortic node measures 2.1 cm series 4, image 103. There also enlarged peripancreatic and periportal nodes. Mesenteric adenopathy is suboptimally assessed on this arterial phase exam, but mesenteric lymph nodes have increased in size from prior. There also ileocolic nodes are enlarged. Bilateral pelvic adenopathy, 17 mm left external iliac node series 4, image 167, 14 mm right external iliac node series 4, image 166. Reproductive: Status post hysterectomy. No adnexal masses. Other: No ascites or free air. Small fat containing umbilical hernia. Musculoskeletal: The bones are diffusely heterogeneous. No acute osseous finding. No discrete osseous lesion. Bilateral hip osteoarthritis. Review of the MIP images confirms the above findings. IMPRESSION: 1. No aortic dissection or acute aortic abnormality. Moderate aortic atherosclerosis. 2. Bulky bilateral axillary, mesenteric, and retroperitoneal adenopathy, consistent with history of CLL. Adenopathy is progressed from 2021 exam. 3. Heterogeneous enhancement of both kidneys with mild perinephric edema, suspicious for pyelonephritis. Recommend correlation with urinalysis. 4. Colonic diverticulosis without diverticulitis. Aortic Atherosclerosis (ICD10-I70.0). Electronically Signed   By: Keith Rake M.D.   On: 05/29/2022 15:17   DG Chest 2 View  Result Date: 05/29/2022 CLINICAL DATA:  Dizziness. EXAM: CHEST - 2 VIEW COMPARISON:  Chest CT dated 10/15/2019 FINDINGS: Stable enlarged heart and tortuous and calcified thoracic aorta. Mild linear scarring in the lingula  without significant change. Otherwise, clear lungs with normal vascularity. Mild thoracic spine degenerative changes. IMPRESSION: No acute abnormality. Stable cardiomegaly. Electronically Signed   By: Claudie Revering M.D.   On: 05/29/2022 12:29   CT Head Wo Contrast  Result Date: 05/29/2022 CLINICAL DATA:  Dizziness.  Numbness.  TIAs. EXAM: CT HEAD WITHOUT CONTRAST TECHNIQUE: Contiguous axial images were obtained from the base of the skull through the vertex without intravenous contrast. RADIATION DOSE REDUCTION: This exam was performed according to the departmental dose-optimization program which includes automated exposure control,  adjustment of the mA and/or kV according to patient size and/or use of iterative reconstruction technique. COMPARISON:  09/15/2021 FINDINGS: Brain: No evidence of intracranial hemorrhage, acute infarction, hydrocephalus, extra-axial collection, or mass lesion/mass effect. Old lacunar infarct noted in left superior cerebellum. Vascular:  No hyperdense vessel or other acute findings. Skull: No evidence of fracture or other significant bone abnormality. Sinuses/Orbits:  No acute findings. Other: None. IMPRESSION: No acute intracranial abnormality. Electronically Signed   By: Marlaine Hind M.D.   On: 05/29/2022 12:23    DISCHARGE EXAMINATION: Vitals:   06/16/22 2347 06/17/22 0438 06/17/22 0719 06/17/22 0755  BP: (!) 147/72 (!) 153/72 (!) 133/93 (!) 152/75  Pulse: 68 63 88 66  Resp: 16 16  16  $ Temp: 97.6 F (36.4 C) 98.1 F (36.7 C) 97.9 F (36.6 C) 98 F (36.7 C)  TempSrc: Oral Oral Oral   SpO2: 96% 98% 98% 100%  Weight:       General appearance: Awake alert.  In no distress Resp: Clear to auscultation bilaterally.  Normal effort Cardio: S1-S2 is normal regular.  No S3-S4.  No rubs murmurs or bruit GI: Abdomen is soft.  Nontender nondistended.  Bowel sounds are present normal.  No masses organomegaly   DISPOSITION: SNF  Discharge Instructions     Ambulatory  referral to Neurology   Complete by: As directed    An appointment is requested in approximately: 8 weeks   Call MD for:  difficulty breathing, headache or visual disturbances   Complete by: As directed    Call MD for:  extreme fatigue   Complete by: As directed    Call MD for:  persistant dizziness or light-headedness   Complete by: As directed    Call MD for:  persistant nausea and vomiting   Complete by: As directed    Call MD for:  severe uncontrolled pain   Complete by: As directed    Call MD for:  temperature >100.4   Complete by: As directed    Discharge instructions   Complete by: As directed    Please review instructions on the discharge summary.  You were cared for by a hospitalist during your hospital stay. If you have any questions about your discharge medications or the care you received while you were in the hospital after you are discharged, you can call the unit and asked to speak with the hospitalist on call if the hospitalist that took care of you is not available. Once you are discharged, your primary care physician will handle any further medical issues. Please note that NO REFILLS for any discharge medications will be authorized once you are discharged, as it is imperative that you return to your primary care physician (or establish a relationship with a primary care physician if you do not have one) for your aftercare needs so that they can reassess your need for medications and monitor your lab values. If you do not have a primary care physician, you can call [phone] for a physician referral.   Increase activity slowly   Complete by: As directed           Allergies as of 06/17/2022   No Known Allergies      Medication List     STOP taking these medications    cilostazol 50 MG tablet Commonly known as: PLETAL   clopidogrel 75 MG tablet Commonly known as: PLAVIX   insulin glargine-yfgn 100 UNIT/ML injection Commonly known as: SEMGLEE   insulin  lispro 100 UNIT/ML  KwikPen Commonly known as: HUMALOG   lisinopril-hydrochlorothiazide 20-12.5 MG tablet Commonly known as: ZESTORETIC       TAKE these medications    amLODipine 10 MG tablet Commonly known as: NORVASC Take 1 tablet by mouth once daily   aspirin EC 81 MG tablet Take 1 tablet (81 mg total) by mouth daily. Swallow whole. Start taking on: June 18, 2022   atorvastatin 80 MG tablet Commonly known as: Lipitor Take 1 tablet (80 mg total) by mouth daily.   Blood Pressure Monitor Automat Devi 1 Device by Does not apply route daily.   ezetimibe 10 MG tablet Commonly known as: ZETIA Take 1 tablet (10 mg total) by mouth daily.   FLUoxetine 20 MG capsule Commonly known as: PROZAC Take 1 capsule by mouth once daily   glucose blood test strip Commonly known as: OneTouch Verio USE TO CHECK BLOOD SUGAR TWICE A DAY AND PRN   glucose blood test strip Commonly known as: Contour Next Test 1 each by Other route 2 (two) times daily. And lancets 2/day   hydrALAZINE 50 MG tablet Commonly known as: APRESOLINE TAKE 1 TABLET BY MOUTH THREE TIMES DAILY   insulin aspart 100 UNIT/ML injection Commonly known as: novoLOG Inject 0-15 Units into the skin 3 (three) times daily with meals. CBG < 70: Implement Hypoglycemia Standing Orders and refer to Hypoglycemia Standing Orders sidebar report CBG 70 - 120: 0 units CBG 121 - 150: 2 units CBG 151 - 200: 3 units CBG 201 - 250: 5 units CBG 251 - 300: 8 units CBG 301 - 350: 11 units CBG 351 - 400: 15 units   lidocaine 5 % Commonly known as: LIDODERM Place 1 patch onto the skin daily. Remove & Discard patch within 12 hours or as directed by MD   loperamide 2 MG capsule Commonly known as: IMODIUM Take 2 capsules (4 mg total) by mouth 3 (three) times daily as needed for diarrhea or loose stools.   ONE TOUCH LANCETS Misc USE TO CHECK BLOOD SUGAR TWICE A DAY AND PRN   pantoprazole 40 MG tablet Commonly known as: PROTONIX Take 1  tablet (40 mg total) by mouth daily.   saccharomyces boulardii 250 MG capsule Commonly known as: FLORASTOR Take 1 capsule (250 mg total) by mouth 2 (two) times daily.   ticagrelor 90 MG Tabs tablet Commonly known as: BRILINTA Take 1 tablet (90 mg total) by mouth 2 (two) times daily.   Trulicity [id number] Sopn Generic drug: Dulaglutide Inject 0.75 mg into the skin once a week.   Vitamin B Complex Tabs Take 1 tablet by mouth every morning.   VITAMIN D3 PO Take 1 capsule by mouth every morning.          Follow-up Information     [name], [name], MD. Schedule an appointment as soon as possible for a visit.   Specialty: Family Medicine Why: post hospitalization follow up Contact information: [place]  [id number] [phone]                 TOTAL DISCHARGE TIME: 61 mins  [place]  [facility] Pager on [email or web address]  06/17/2022, 10:52 AM

## 2022-06-17 NOTE — Plan of Care (Signed)
  Problem: Education: Goal: Knowledge of General Education information will improve Description: Including pain rating scale, medication(s)/side effects and non-pharmacologic comfort measures Outcome: Progressing   Problem: Clinical Measurements: Goal: Ability to maintain clinical measurements within normal limits will improve Outcome: Progressing Goal: Will remain free from infection Outcome: Progressing Goal: Respiratory complications will improve Outcome: Progressing   Problem: Nutrition: Goal: Adequate nutrition will be maintained Outcome: Progressing   Problem: Elimination: Goal: Will not experience complications related to bowel motility Outcome: Progressing Goal: Will not experience complications related to urinary retention Outcome: Progressing   Problem: Pain Managment: Goal: General experience of comfort will improve Outcome: Progressing   Problem: Safety: Goal: Ability to remain free from injury will improve Outcome: Progressing   Problem: Skin Integrity: Goal: Risk for impaired skin integrity will decrease Outcome: Progressing

## 2022-06-18 LAB — GLUCOSE, CAPILLARY
Glucose-Capillary: 132 mg/dL — ABNORMAL HIGH (ref 70–99)
Glucose-Capillary: 185 mg/dL — ABNORMAL HIGH (ref 70–99)
Glucose-Capillary: 130 mg/dL — ABNORMAL HIGH (ref 70–99)
Glucose-Capillary: 162 mg/dL — ABNORMAL HIGH (ref 70–99)
Glucose-Capillary: 163 mg/dL — ABNORMAL HIGH (ref 70–99)
Glucose-Capillary: 158 mg/dL — ABNORMAL HIGH (ref 70–99)

## 2022-06-18 NOTE — Discharge Summary (Signed)
[facility]  Physician Discharge Summary   Patient ID: [name] MRN: [id number] DOB/AGE: 1960-06-05 62 y.o.  Admit date: 06/12/2022 Discharge date:   06/18/2022   PCP: [name], [name], MD  DISCHARGE DIAGNOSES:    Acute CVA (cerebrovascular accident) [facility])   Dysarthria Aspiration pneumonitis Diarrhea, resolved Essential hypertension Insulin-dependent diabetes mellitus Peripheral artery disease Chronic diastolic CHF Microcytic anemia   RECOMMENDATIONS FOR OUTPATIENT FOLLOW UP: Please check CBC and basic metabolic panel in 3 to 4 days Ambulatory referral has been sent to neurology for outpatient follow-up See below regarding reinitiating antihypertensives.   Home Health: Needs SNF Equipment/Devices: None  CODE STATUS: Full code  DISCHARGE CONDITION: fair  Diet recommendation: Modified carbohydrate with thin liquids  INITIAL HISTORY: [name] is a 62 y.o. female with medical history significant of multiple strokes in 2022, 2023 and 2024, HTN, HLD, IDDM, PVD, non-Hodgkin's lymphoma, sickle cell trait, CKD stage IIIa, was found to have new onset of right-sided weakness and worsening of dysarthria and drooling at [facility] hospital inpatient rehab unit.   Patient admitted 2 weeks ago secondary to new onset of right medulla stroke,subsequently discharged to inpt rehab - L sided weakness/dysarthria noted at time of discharge. Staff at rehab on 06/12/22 noted seemingly new weakness of right arm and hand, when patient was unable to hold anything on her right hand as well as worsening of slurred speech as well as new left-sided drooling.  Neurology consulted, given that exact onset of her symptoms is unknown, decision made to not give TNK.  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 Admitted to hospitalist service with neurology consult.  Consultations: Neurology    HOSPITAL COURSE:   Acute extension of previous  stroke, with new onset of right-sided paresis, dysphagia and facial droop -MRI confirms "Increased size of a recent right medullary infarct which now extends to the left of midline.New punctate acute left cerebellar infarct." -Neurology following, recommending aspirin and Brilinta for 3 months followed by aspirin alone.  If there is difficulty in affording Brilinta then consider switching to aspirin and Plavix after 4 weeks. Initially there was concern about her ability to take medications orally but looks like her oral intake has improved. Initially there was plan for the patient to go back to CIR but does not appear that that is an option currently.  She needs to go to skilled nursing facility for rehab.   Probable aspiration pneumonitis Pneumonia less likely but cannot be ruled out -Secondary to above presumed worsening dysphagia - Given acuity unlikely to be infectious rather than reactive inflammatory/pneumonitis. Patient was started on Unasyn.  However patient started experiencing diarrhea.  Since her respiratory status was otherwise stable Unasyn was discontinued.  Procalcitonin was unremarkable.    Acute diarrhea Most likely due to antibiotics.  Abdomen is benign.  She was given Imodium and probiotics.  Antibiotics were discontinued.  Diarrhea has resolved.  Abdomen remains benign.  WBC is normal.   Essential hypertension Initially allowed permissive hypertension.  Blood pressure to be gradually decreased.  Amlodipine was resumed.  Can also resume hydralazine at discharge.  Lisinopril can be reinitiated depending on blood pressures and blood work over the next 3 to 4 days.   IDDM, uncontrolled with hyperglycemia -A1c 11.4 just last month BG's.  Continue just SSI for now.  Glargine can be reintroduced depending on glucose levels.     PAD -Will hold cilostazol while patient on DAPT of aspirin and Brilinta  CKD stage IIIa -Euvolemic, creatinine level stable   Chronic HFpEF Stable  for the most part.   Microcytic anemia No evidence of overt bleeding.  Outpatient management.    Obesity Estimated body mass index is 31.9 kg/m as calculated from the following:   Height as of 06/04/22: 5' 4"$  (1.626 m).   Weight as of this encounter: 84.3 kg.  Patient is stable.  Okay for discharge to SNF when bed is available.   PERTINENT LABS:  The results of significant diagnostics from this hospitalization (including imaging, microbiology, ancillary and laboratory) are listed below for reference.    Labs:   Basic Metabolic Panel: Recent Labs  Lab 06/12/22 1328 06/13/22 0720 06/16/22 0633 06/17/22 0702  NA 138 142 137 137  K 4.1 4.1 3.8 3.8  CL 111 113* 105 107  CO2 21* 20* 18* 20*  GLUCOSE 104* 106* 162* 126*  BUN 24* 20 18 19  $ CREATININE 1.30* 1.22* 1.16* 1.20*  CALCIUM 9.6 9.5 9.4 9.3   Liver Function Tests: Recent Labs  Lab 06/17/22 0702  AST 27  ALT 42  ALKPHOS 83  BILITOT 0.3  PROT 6.1*  ALBUMIN 2.2*    CBC: Recent Labs  Lab 06/12/22 1328 06/13/22 0720 06/16/22 0633 06/17/22 0702  WBC 7.0 6.1 14.3* 10.1  HGB 11.0* 10.6* 11.5* 10.7*  HCT 32.9* 32.4* 35.0* 33.1*  MCV 78.3* 78.6* 77.4* 77.5*  PLT 244 247 271 240     CBG: Recent Labs  Lab 06/17/22 0612 06/17/22 1154 06/17/22 1710 06/17/22 2100 06/18/22 0610  GLUCAP 117* 132* 150* 185* 130*     IMAGING STUDIES DG Chest 1 View  Result Date: 06/13/2022 CLINICAL DATA:  Aspiration pneumonia EXAM: CHEST  1 VIEW COMPARISON:  Radiograph 06/12/2022 FINDINGS: Unchanged cardiomediastinal silhouette. Bandlike atelectasis in the lower lungs. No new airspace disease. No pleural effusion or evidence of pneumothorax. Bones are unchanged. IMPRESSION: No new airspace disease. Electronically Signed   By: Maurine Simmering M.D.   On: 06/13/2022 07:58   DG Chest 1 View  Result Date: 06/12/2022 CLINICAL DATA:  Pneumonia EXAM: CHEST  1 VIEW COMPARISON:  Chest x-ray 05/29/2022 FINDINGS: Cardiomediastinal  silhouette is within normal limits. There is a linear band of atelectasis or scarring in the left mid lung similar to prior. Lungs are otherwise clear. No pleural effusion or pneumothorax. No acute fractures. IMPRESSION: No acute cardiopulmonary process.  No significant interval change. Electronically Signed   By: Ronney Asters M.D.   On: 06/12/2022 16:17   MR BRAIN WO CONTRAST  Result Date: 06/12/2022 CLINICAL DATA:  Neuro deficit, acute, stroke suspected. New right sided weakness, worsening aphasia. EXAM: MRI HEAD WITHOUT CONTRAST TECHNIQUE: Multiplanar, multiecho pulse sequences of the brain and surrounding structures were obtained without intravenous contrast. COMPARISON:  Head CT 06/11/2022 and MRI/MRA 05/30/2022 FINDINGS: Multiple sequences are up to moderately motion degraded. Brain: The acute infarct in the right ventral medulla on the prior MRI has enlarged and now extends to the left of midline. There is also a new punctate acute infarct in the left cerebellar hemisphere. Small T2 hyperintensities in the cerebral white matter bilaterally are unchanged and nonspecific but compatible with mild chronic small vessel ischemic disease chronic microhemorrhages are again noted in the right thalamus and right parietal white matter. Small chronic bilateral cerebellar infarcts are again noted with hemosiderin deposition associated with 1 of the left-sided infarcts. The ventricles and sulci are normal. No mass, midline shift, or extra-axial fluid collection is identified. Vascular: Unchanged abnormal appearance  of the distal right vertebral artery, more fully evaluated on the prior MRA. Skull and upper cervical spine: No suspicious marrow lesion. Sinuses/Orbits: Unremarkable orbits. Mucous retention cyst in the right maxillary sinus. Small left mastoid effusion. Other: Partially visualized bilateral cervical lymphadenopathy with history of lymphoma. IMPRESSION: 1. Increased size of a recent right medullary infarct  which now extends to the left of midline. 2. New punctate acute left cerebellar infarct. 3. Mild chronic small vessel ischemic disease. Chronic cerebellar infarcts. These results will be called to the ordering clinician or representative by the Radiologist Assistant, and communication documented in the PACS or [employer]. Electronically Signed   By: Logan Bores M.D.   On: 06/12/2022 13:24   CT HEAD WO CONTRAST (5MM)  Result Date: 06/11/2022 CLINICAL DATA:  Follow-up examination for stroke, slurred speech. EXAM: CT HEAD WITHOUT CONTRAST TECHNIQUE: Contiguous axial images were obtained from the base of the skull through the vertex without intravenous contrast. RADIATION DOSE REDUCTION: This exam was performed according to the departmental dose-optimization program which includes automated exposure control, adjustment of the mA and/or kV according to patient size and/or use of iterative reconstruction technique. COMPARISON:  Comparison made with prior CT and MRI from 05/30/2022. FINDINGS: Brain: Cerebral volume within normal limits for age. Mild chronic small vessel ischemic disease noted. Previously identified small medullary infarct not visible by CT. No other acute large vessel territory infarct. No intracranial hemorrhage. No mass lesion or midline shift. No hydrocephalus or extra-axial fluid collection. Vascular: No abnormal hyperdense vessel. Calcified atherosclerosis present about the skull base. Skull: Scalp soft tissues and calvarium within normal limits. Sinuses/Orbits: Globes and orbital soft tissues demonstrate no acute finding. Paranasal sinuses remain largely clear. No mastoid effusion. Other: None. IMPRESSION: 1. No acute intracranial abnormality. 2. Mild chronic small vessel ischemic disease. Previously identified small medullary infarct not visible by CT. Electronically Signed   By: Jeannine Boga M.D.   On: 06/11/2022 21:07   ECHOCARDIOGRAM COMPLETE  Result Date: 05/31/2022     ECHOCARDIOGRAM REPORT   Patient Name:   THANDIWE SAMARAS Date of Exam: 05/31/2022 Medical Rec #:  [id number]       Height:       64.0 in Accession #:    [id number]      Weight:       179.0 lb Date of Birth:  02-26-1961       BSA:          1.866 m Patient Age:    28 years        BP:           168/93 mmHg Patient Gender: F               HR:           72 bpm. Exam Location:  Inpatient Procedure: 2D Echo, Cardiac Doppler and Color Doppler Indications:    Stroke  History:        Patient has prior history of Echocardiogram examinations.                 Stroke; Risk Factors:Hypertension, Diabetes and Dyslipidemia.  Sonographer:    Phineas Douglas Referring Phys: [phone] AMRIT ADHIKARI IMPRESSIONS  1. Left ventricular ejection fraction, by estimation, is 60 to 65%. The left ventricle has normal function. The left ventricle has no regional wall motion abnormalities. There is mild concentric left ventricular hypertrophy. Left ventricular diastolic parameters are consistent with Grade I diastolic dysfunction (impaired relaxation).  2. Right ventricular systolic function  is normal. The right ventricular size is normal. Tricuspid regurgitation signal is inadequate for assessing PA pressure.  3. The mitral valve is grossly normal. No evidence of mitral valve regurgitation. No evidence of mitral stenosis.  4. The aortic valve is tricuspid. Aortic valve regurgitation is not visualized. No aortic stenosis is present.  5. The inferior vena cava is normal in size with greater than 50% respiratory variability, suggesting right atrial pressure of 3 mmHg. Conclusion(s)/Recommendation(s): No intracardiac source of embolism detected on this transthoracic study. Consider a transesophageal echocardiogram to exclude cardiac source of embolism if clinically indicated. FINDINGS  Left Ventricle: Left ventricular ejection fraction, by estimation, is 60 to 65%. The left ventricle has normal function. The left ventricle has no regional wall motion  abnormalities. The left ventricular internal cavity size was normal in size. There is  mild concentric left ventricular hypertrophy. Left ventricular diastolic parameters are consistent with Grade I diastolic dysfunction (impaired relaxation). Right Ventricle: The right ventricular size is normal. No increase in right ventricular wall thickness. Right ventricular systolic function is normal. Tricuspid regurgitation signal is inadequate for assessing PA pressure. Left Atrium: Left atrial size was normal in size. Right Atrium: Right atrial size was normal in size. Pericardium: Trivial pericardial effusion is present. Mitral Valve: The mitral valve is grossly normal. No evidence of mitral valve regurgitation. No evidence of mitral valve stenosis. Tricuspid Valve: The tricuspid valve is grossly normal. Tricuspid valve regurgitation is trivial. No evidence of tricuspid stenosis. Aortic Valve: The aortic valve is tricuspid. Aortic valve regurgitation is not visualized. No aortic stenosis is present. Pulmonic Valve: The pulmonic valve was grossly normal. Pulmonic valve regurgitation is trivial. No evidence of pulmonic stenosis. Aorta: The aortic root and ascending aorta are structurally normal, with no evidence of dilitation. Venous: The inferior vena cava is normal in size with greater than 50% respiratory variability, suggesting right atrial pressure of 3 mmHg. IAS/Shunts: The atrial septum is grossly normal.  LEFT VENTRICLE PLAX 2D LVIDd:         3.60 cm      Diastology LVIDs:         2.30 cm      LV e' medial:    5.68 cm/s LV PW:         1.30 cm      LV E/e' medial:  12.4 LV IVS:        1.20 cm      LV e' lateral:   7.22 cm/s LVOT diam:     2.00 cm      LV E/e' lateral: 9.8 LV SV:         69 LV SV Index:   37 LVOT Area:     3.14 cm  LV Volumes (MOD) LV vol d, MOD A2C: 119.0 ml LV vol d, MOD A4C: 111.0 ml LV vol s, MOD A2C: 44.0 ml LV vol s, MOD A4C: 48.0 ml LV SV MOD A2C:     75.0 ml LV SV MOD A4C:     111.0 ml LV SV  MOD BP:      68.6 ml RIGHT VENTRICLE             IVC RV Basal diam:  3.10 cm     IVC diam: 1.40 cm RV S prime:     13.90 cm/s TAPSE (M-mode): 1.8 cm LEFT ATRIUM             Index        RIGHT ATRIUM  Index LA diam:        3.70 cm 1.98 cm/m   RA Area:     12.00 cm LA Vol (A2C):   51.4 ml 27.54 ml/m  RA Volume:   23.00 ml  12.32 ml/m LA Vol (A4C):   56.6 ml 30.33 ml/m LA Biplane Vol: 58.1 ml 31.13 ml/m  AORTIC VALVE LVOT Vmax:   93.20 cm/s LVOT Vmean:  62.800 cm/s LVOT VTI:    0.220 m  AORTA Ao Root diam: 3.10 cm Ao Asc diam:  3.20 cm MITRAL VALVE MV Area (PHT): 2.77 cm    SHUNTS MV Decel Time: 274 msec    Systemic VTI:  0.22 m MV E velocity: 70.70 cm/s  Systemic Diam: 2.00 cm MV A velocity: 95.50 cm/s MV E/A ratio:  0.74 Eleonore Chiquito MD Electronically signed by Eleonore Chiquito MD Signature Date/Time: 05/31/2022/9:44:38 AM    Final    CT HEAD WO CONTRAST (5MM)  Result Date: 05/30/2022 CLINICAL DATA:  Follow-up stroke EXAM: CT HEAD WITHOUT CONTRAST TECHNIQUE: Contiguous axial images were obtained from the base of the skull through the vertex without intravenous contrast. RADIATION DOSE REDUCTION: This exam was performed according to the departmental dose-optimization program which includes automated exposure control, adjustment of the mA and/or kV according to patient size and/or use of iterative reconstruction technique. COMPARISON:  MRI brain dated 05/30/2022 at 1031 hours FINDINGS: Brain: No evidence of acute infarction, hemorrhage, hydrocephalus, extra-axial collection or mass lesion/mass effect. Known tiny acute infarct within the right medulla is not evident on current CT. Mild subcortical white matter and periventricular small vessel ischemic changes. Vascular: Intracranial atherosclerosis. Skull: Normal. Negative for fracture or focal lesion. Sinuses/Orbits: The visualized paranasal sinuses are essentially clear. The mastoid air cells are unopacified. Other: None. IMPRESSION: Known tiny acute  infarct within the right medulla is not evident on current CT. Mild small vessel ischemic changes. Electronically Signed   By: Julian Hy M.D.   On: 05/30/2022 23:55   MR ANGIO HEAD WO CONTRAST  Result Date: 05/30/2022 CLINICAL DATA:  History of non-Hodgkin's lymphoma, hypertension, Guillain-Barre syndrome, and multiple strokes. Complains of dizziness and overall weakness. Possible CVA. EXAM: MRI HEAD WITHOUT CONTRAST MRA HEAD WITHOUT CONTRAST MRA NECK WITHOUT AND WITH CONTRAST TECHNIQUE: Multiplanar, multi-echo pulse sequences of the brain and surrounding structures were acquired without intravenous contrast. Angiographic images of the Circle of Willis were acquired using MRA technique without intravenous contrast. Angiographic images of the neck were acquired using MRA technique without and with intravenous contrast. Carotid stenosis measurements (when applicable) are obtained utilizing NASCET criteria, using the distal internal carotid diameter as the denominator. CONTRAST:  68m GADAVIST GADOBUTROL 1 MMOL/ML IV SOLN COMPARISON:  None Available. CT head 1 day prior, CTA head/neck 09/15/2021 FINDINGS: MRI HEAD FINDINGS Brain: There is diffusion restriction in the right aspect of the medulla with faint associated FLAIR signal abnormality consistent with acute infarct (5-9). There is no associated hemorrhage or mass effect. There is no other evidence of acute infarct. There is no acute intracranial hemorrhage or extra-axial fluid collection. Parenchymal volume is normal. The ventricles are normal in size. Scattered foci of FLAIR signal abnormality in the supratentorial white matter are nonspecific but likely reflects sequela of underlying chronic small-vessel ischemic change, similar to the MRI from 2023. There are small remote infarcts in the bilateral cerebellar hemispheres, at least 1 of which in the left cerebellar hemisphere is new since 09/15/2021. There are punctate chronic microhemorrhages in the  right thalamus and right parietal  white matter, nonspecific but possibly hypertensive. The pituitary and suprasellar region are normal. There is no mass lesion. There is no mass effect or midline shift. Vascular: See below. Skull and upper cervical spine: Normal marrow signal. Sinuses/Orbits: The paranasal sinuses are clear. The globes and orbits are unremarkable. Other: There are prominent lymph nodes in the imaged upper neck on the coronal diffusion sequence which appear overall decreased in size since the prior MRI from 2023 though are incompletely evaluated. MRA HEAD FINDINGS Anterior circulation: There is atherosclerotic irregularity of the carotid siphons resulting in moderate stenosis of the cavernous segment on the left and mild-to-moderate stenosis of the supraclinoid segment on the right. The left M1 segment is patent. There is moderate to severe stenosis of the origin of the superior left M2 division (7-156). The distal left MCA branches appear patent. There is severe stenosis of the distal right M1 segment (1036-3). There is multifocal moderate narrowing of the proximal right superior M2 division. The distal right MCA branches otherwise appear patent. The bilateral ACAs are patent, without proximal stenosis or occlusion. The anterior communicating artery is not definitely identified. There is no aneurysm or AVM. Posterior circulation: There is no flow related enhancement of the right V4 segment on the time-of-flight MRA head. There is intermittent enhancement on the postcontrast MRA neck images. Findings are similar to the CTA from 2023. The PICA origin is not seen on the right. There is multifocal severe stenosis/near occlusion of the left V4 segment, also similar to the prior CT. PICA is identified on the left. The basilar artery is patent with atherosclerotic irregularity and narrowing. There is severe stenosis of the origin of the left P1 segment. There is multifocal atherosclerotic irregularity and  narrowing in the remainder of the left PCA without other high-grade stenosis or occlusion. The right PCA is patent with mild atherosclerotic irregularity but no high-grade stenosis or occlusion. Findings are similar to the prior CTA. There is no aneurysm or AVM. Anatomic variants: None. MRA NECK FINDINGS Aortic arch: The imaged aortic arch is normal. The origins of the major branch vessels are patent. The subclavian arteries are patent to the level imaged. 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 Right carotid system: The right common, internal, and external carotid arteries are patent, with mild irregularity at the bifurcation but no hemodynamically significant stenosis or occlusion. There is no evidence of dissection or aneurysm. Left carotid system: The left common, internal, and external carotid arteries are patent, without significant stenosis or occlusion. There is no evidence of dissection or aneurysm. Vertebral arteries: The right vertebral artery appears patent at its origin. There is short-segment high-grade stenosis/occlusion of the distal V2 segment (15-60). There is diminutive reconstitution with further occlusion of the V3 segment. There is moderate stenosis of the left vertebral artery origin (15-52). The left vertebral artery is otherwise patent in the neck without other hemodynamically significant stenosis or occlusion. There is no evidence of dissection or aneurysm. There is antegrade flow in both vertebral arteries. Other: None IMPRESSION: MR HEAD 1. Small acute infarct in the right aspect of the medulla without hemorrhage or mass effect. 2. Background chronic small-vessel ischemic change with small remote infarcts in the bilateral cerebellar hemispheres, one of which on the left is new since 09/15/2021. 3. Prominent lymph nodes in the upper neck consistent with the history of lymphoma, overall decreased in size since the prior studies from 09/15/2021 but incompletely  evaluated. MRA HEAD 1. Diminutive enhancement of the right V4  segment with intermittent occlusion, and multifocal severe stenosis/near occlusion of the left V4 segment, similar to the prior CTA. 2. Additional intracranial atherosclerotic disease as detailed above resulting in moderate stenosis of the left cavernous ICA, mild-to-moderate stenosis of the right supraclinoid ICA, severe stenosis of the origin of the left superior M2 division, severe stenosis of the distal right M1 segment, and severe stenosis of the proximal left P1 segment. MRA NECK 1. 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 2. Moderate stenosis of the origin of the left vertebral artery, not significantly changed. 3. Patent carotid systems bilaterally. Electronically Signed   By: Valetta Mole M.D.   On: 05/30/2022 12:01   MR BRAIN WO CONTRAST  Result Date: 05/30/2022 CLINICAL DATA:  History of non-Hodgkin's lymphoma, hypertension, Guillain-Barre syndrome, and multiple strokes. Complains of dizziness and overall weakness. Possible CVA. EXAM: MRI HEAD WITHOUT CONTRAST MRA HEAD WITHOUT CONTRAST MRA NECK WITHOUT AND WITH CONTRAST TECHNIQUE: Multiplanar, multi-echo pulse sequences of the brain and surrounding structures were acquired without intravenous contrast. Angiographic images of the Circle of Willis were acquired using MRA technique without intravenous contrast. Angiographic images of the neck were acquired using MRA technique without and with intravenous contrast. Carotid stenosis measurements (when applicable) are obtained utilizing NASCET criteria, using the distal internal carotid diameter as the denominator. CONTRAST:  16m GADAVIST GADOBUTROL 1 MMOL/ML IV SOLN COMPARISON:  None Available. CT head 1 day prior, CTA head/neck 09/15/2021 FINDINGS: MRI HEAD FINDINGS Brain: There is diffusion restriction in the right aspect of the medulla with faint associated FLAIR signal abnormality consistent with  acute infarct (5-9). There is no associated hemorrhage or mass effect. There is no other evidence of acute infarct. There is no acute intracranial hemorrhage or extra-axial fluid collection. Parenchymal volume is normal. The ventricles are normal in size. Scattered foci of FLAIR signal abnormality in the supratentorial white matter are nonspecific but likely reflects sequela of underlying chronic small-vessel ischemic change, similar to the MRI from 2023. There are small remote infarcts in the bilateral cerebellar hemispheres, at least 1 of which in the left cerebellar hemisphere is new since 09/15/2021. There are punctate chronic microhemorrhages in the right thalamus and right parietal white matter, nonspecific but possibly hypertensive. The pituitary and suprasellar region are normal. There is no mass lesion. There is no mass effect or midline shift. Vascular: See below. Skull and upper cervical spine: Normal marrow signal. Sinuses/Orbits: The paranasal sinuses are clear. The globes and orbits are unremarkable. Other: There are prominent lymph nodes in the imaged upper neck on the coronal diffusion sequence which appear overall decreased in size since the prior MRI from 2023 though are incompletely evaluated. MRA HEAD FINDINGS Anterior circulation: There is atherosclerotic irregularity of the carotid siphons resulting in moderate stenosis of the cavernous segment on the left and mild-to-moderate stenosis of the supraclinoid segment on the right. The left M1 segment is patent. There is moderate to severe stenosis of the origin of the superior left M2 division (7-156). The distal left MCA branches appear patent. There is severe stenosis of the distal right M1 segment (1036-3). There is multifocal moderate narrowing of the proximal right superior M2 division. The distal right MCA branches otherwise appear patent. The bilateral ACAs are patent, without proximal stenosis or occlusion. The anterior communicating artery  is not definitely identified. There is no aneurysm or AVM. Posterior circulation: There is no flow related enhancement of the right V4 segment on the time-of-flight MRA head.  There is intermittent enhancement on the postcontrast MRA neck images. Findings are similar to the CTA from 2023. The PICA origin is not seen on the right. There is multifocal severe stenosis/near occlusion of the left V4 segment, also similar to the prior CT. PICA is identified on the left. The basilar artery is patent with atherosclerotic irregularity and narrowing. There is severe stenosis of the origin of the left P1 segment. There is multifocal atherosclerotic irregularity and narrowing in the remainder of the left PCA without other high-grade stenosis or occlusion. The right PCA is patent with mild atherosclerotic irregularity but no high-grade stenosis or occlusion. Findings are similar to the prior CTA. There is no aneurysm or AVM. Anatomic variants: None. MRA NECK FINDINGS Aortic arch: The imaged aortic arch is normal. The origins of the major branch vessels are patent. The subclavian arteries are patent to the level imaged. 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 Right carotid system: The right common, internal, and external carotid arteries are patent, with mild irregularity at the bifurcation but no hemodynamically significant stenosis or occlusion. There is no evidence of dissection or aneurysm. Left carotid system: The left common, internal, and external carotid arteries are patent, without significant stenosis or occlusion. There is no evidence of dissection or aneurysm. Vertebral arteries: The right vertebral artery appears patent at its origin. There is short-segment high-grade stenosis/occlusion of the distal V2 segment (15-60). There is diminutive reconstitution with further occlusion of the V3 segment. There is moderate stenosis of the left vertebral artery origin (15-52). The left vertebral artery is  otherwise patent in the neck without other hemodynamically significant stenosis or occlusion. There is no evidence of dissection or aneurysm. There is antegrade flow in both vertebral arteries. Other: None IMPRESSION: MR HEAD 1. Small acute infarct in the right aspect of the medulla without hemorrhage or mass effect. 2. Background chronic small-vessel ischemic change with small remote infarcts in the bilateral cerebellar hemispheres, one of which on the left is new since 09/15/2021. 3. Prominent lymph nodes in the upper neck consistent with the history of lymphoma, overall decreased in size since the prior studies from 09/15/2021 but incompletely evaluated. MRA HEAD 1. Diminutive enhancement of the right V4 segment with intermittent occlusion, and multifocal severe stenosis/near occlusion of the left V4 segment, similar to the prior CTA. 2. Additional intracranial atherosclerotic disease as detailed above resulting in moderate stenosis of the left cavernous ICA, mild-to-moderate stenosis of the right supraclinoid ICA, severe stenosis of the origin of the left superior M2 division, severe stenosis of the distal right M1 segment, and severe stenosis of the proximal left P1 segment. MRA NECK 1. 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 2. Moderate stenosis of the origin of the left vertebral artery, not significantly changed. 3. Patent carotid systems bilaterally. Electronically Signed   By: Valetta Mole M.D.   On: 05/30/2022 12:01   MR ANGIO NECK W WO CONTRAST  Result Date: 05/30/2022 CLINICAL DATA:  History of non-Hodgkin's lymphoma, hypertension, Guillain-Barre syndrome, and multiple strokes. Complains of dizziness and overall weakness. Possible CVA. EXAM: MRI HEAD WITHOUT CONTRAST MRA HEAD WITHOUT CONTRAST MRA NECK WITHOUT AND WITH CONTRAST TECHNIQUE: Multiplanar, multi-echo pulse sequences of the brain and surrounding structures were acquired without intravenous  contrast. Angiographic images of the Circle of Willis were acquired using MRA technique without intravenous contrast. Angiographic images of the neck were acquired using MRA technique without and with intravenous contrast. Carotid stenosis  measurements (when applicable) are obtained utilizing NASCET criteria, using the distal internal carotid diameter as the denominator. CONTRAST:  26m GADAVIST GADOBUTROL 1 MMOL/ML IV SOLN COMPARISON:  None Available. CT head 1 day prior, CTA head/neck 09/15/2021 FINDINGS: MRI HEAD FINDINGS Brain: There is diffusion restriction in the right aspect of the medulla with faint associated FLAIR signal abnormality consistent with acute infarct (5-9). There is no associated hemorrhage or mass effect. There is no other evidence of acute infarct. There is no acute intracranial hemorrhage or extra-axial fluid collection. Parenchymal volume is normal. The ventricles are normal in size. Scattered foci of FLAIR signal abnormality in the supratentorial white matter are nonspecific but likely reflects sequela of underlying chronic small-vessel ischemic change, similar to the MRI from 2023. There are small remote infarcts in the bilateral cerebellar hemispheres, at least 1 of which in the left cerebellar hemisphere is new since 09/15/2021. There are punctate chronic microhemorrhages in the right thalamus and right parietal white matter, nonspecific but possibly hypertensive. The pituitary and suprasellar region are normal. There is no mass lesion. There is no mass effect or midline shift. Vascular: See below. Skull and upper cervical spine: Normal marrow signal. Sinuses/Orbits: The paranasal sinuses are clear. The globes and orbits are unremarkable. Other: There are prominent lymph nodes in the imaged upper neck on the coronal diffusion sequence which appear overall decreased in size since the prior MRI from 2023 though are incompletely evaluated. MRA HEAD FINDINGS Anterior circulation: There is  atherosclerotic irregularity of the carotid siphons resulting in moderate stenosis of the cavernous segment on the left and mild-to-moderate stenosis of the supraclinoid segment on the right. The left M1 segment is patent. There is moderate to severe stenosis of the origin of the superior left M2 division (7-156). The distal left MCA branches appear patent. There is severe stenosis of the distal right M1 segment (1036-3). There is multifocal moderate narrowing of the proximal right superior M2 division. The distal right MCA branches otherwise appear patent. The bilateral ACAs are patent, without proximal stenosis or occlusion. The anterior communicating artery is not definitely identified. There is no aneurysm or AVM. Posterior circulation: There is no flow related enhancement of the right V4 segment on the time-of-flight MRA head. There is intermittent enhancement on the postcontrast MRA neck images. Findings are similar to the CTA from 2023. The PICA origin is not seen on the right. There is multifocal severe stenosis/near occlusion of the left V4 segment, also similar to the prior CT. PICA is identified on the left. The basilar artery is patent with atherosclerotic irregularity and narrowing. There is severe stenosis of the origin of the left P1 segment. There is multifocal atherosclerotic irregularity and narrowing in the remainder of the left PCA without other high-grade stenosis or occlusion. The right PCA is patent with mild atherosclerotic irregularity but no high-grade stenosis or occlusion. Findings are similar to the prior CTA. There is no aneurysm or AVM. Anatomic variants: None. MRA NECK FINDINGS Aortic arch: The imaged aortic arch is normal. The origins of the major branch vessels are patent. The subclavian arteries are patent to the level imaged. 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 Right carotid system: The right common, internal, and external carotid arteries are patent,  with mild irregularity at the bifurcation but no hemodynamically significant stenosis or occlusion. There is no evidence of dissection or aneurysm. Left carotid system: The left common, internal, and external carotid arteries are patent, without significant stenosis or occlusion. There  is no evidence of dissection or aneurysm. Vertebral arteries: The right vertebral artery appears patent at its origin. There is short-segment high-grade stenosis/occlusion of the distal V2 segment (15-60). There is diminutive reconstitution with further occlusion of the V3 segment. There is moderate stenosis of the left vertebral artery origin (15-52). The left vertebral artery is otherwise patent in the neck without other hemodynamically significant stenosis or occlusion. There is no evidence of dissection or aneurysm. There is antegrade flow in both vertebral arteries. Other: None IMPRESSION: MR HEAD 1. Small acute infarct in the right aspect of the medulla without hemorrhage or mass effect. 2. Background chronic small-vessel ischemic change with small remote infarcts in the bilateral cerebellar hemispheres, one of which on the left is new since 09/15/2021. 3. Prominent lymph nodes in the upper neck consistent with the history of lymphoma, overall decreased in size since the prior studies from 09/15/2021 but incompletely evaluated. MRA HEAD 1. Diminutive enhancement of the right V4 segment with intermittent occlusion, and multifocal severe stenosis/near occlusion of the left V4 segment, similar to the prior CTA. 2. Additional intracranial atherosclerotic disease as detailed above resulting in moderate stenosis of the left cavernous ICA, mild-to-moderate stenosis of the right supraclinoid ICA, severe stenosis of the origin of the left superior M2 division, severe stenosis of the distal right M1 segment, and severe stenosis of the proximal left P1 segment. MRA NECK 1. 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 2. Moderate stenosis of the origin of the left vertebral artery, not significantly changed. 3. Patent carotid systems bilaterally. Electronically Signed   By: Valetta Mole M.D.   On: 05/30/2022 12:01   CT Angio Chest/Abd/Pel for Dissection W and/or W/WO  Result Date: 05/29/2022 CLINICAL DATA:  numbness, tingling ,dizziness, hypertensive Radiologic records indicates history of CLL. EXAM: CT ANGIOGRAPHY CHEST, ABDOMEN AND PELVIS TECHNIQUE: Non-contrast CT of the chest was initially obtained. Multidetector CT imaging through the chest, abdomen and pelvis was performed using the standard protocol during bolus administration of intravenous contrast. Multiplanar reconstructed images and MIPs were obtained and reviewed to evaluate the vascular anatomy. RADIATION DOSE REDUCTION: This exam was performed according to the departmental dose-optimization program which includes automated exposure control, adjustment of the mA and/or kV according to patient size and/or use of iterative reconstruction technique. CONTRAST:  67m OMNIPAQUE IOHEXOL 350 MG/ML SOLN COMPARISON:  Chest radiograph earlier today. Chest, abdomen, pelvis CT 10/15/2019 FINDINGS: CTA CHEST FINDINGS Cardiovascular: No aortic hematoma on this unenhanced exam. Thoracic aorta is normal in caliber with mild tortuosity. Mild aortic atherosclerosis. No dissection, aneurysm or acute aortic findings. Aberrant right subclavian artery. No central pulmonary embolus on this exam not tailored to pulmonary artery assessment. Borderline cardiomegaly. Minimal pericardial effusion. Mediastinum/Nodes: No enlarged mediastinal or hilar lymph nodes. There is bulky bilateral axillary adenopathy. Index right axillary node measures 2.1 cm short axis series 4, image 21. Left axillary node measures 2.5 cm short axis series 4, image 33. 11 mm left supraclavicular node is stable. Similar appearance of heterogeneous right lobe of the thyroid. No esophageal wall  thickening. Lungs/Pleura: Minor atelectasis in the lingula. No other focal airspace disease. No pleural fluid. No features of pulmonary edema. No pulmonary mass or nodule. The trachea and central airways are patent. Musculoskeletal: Heterogeneous appearance of the osseous structures but no discrete focal lesion. No acute osseous findings. Review of the MIP images confirms the above findings. CTA ABDOMEN AND PELVIS FINDINGS VASCULAR Aorta: Moderate atherosclerosis. Normal in caliber  without aneurysm, dissection, vasculitis or significant stenosis. Celiac: Mild plaque at the origin with minimal stenosis. Branch vessels are patent. No dissection or acute findings. SMA: Patent without evidence of aneurysm, dissection, vasculitis or significant stenosis. Renals: Single bilateral renal arteries. Mild calcified plaque at the origin of the both renal arteries with minimal stenosis. No dissection or acute findings. Lisa: Patent without evidence of aneurysm, dissection, vasculitis or significant stenosis. Inflow: Moderate atherosclerosis. No aneurysm, dissection or acute findings. Veins: No obvious venous abnormality within the limitations of this arterial phase study. Review of the MIP images confirms the above findings. NON-VASCULAR Hepatobiliary: No focal hepatic abnormality on this arterial phase exam. Or gallbladder Pancreas: No ductal dilatation or inflammation. Spleen: Normal in size and arterial enhancement. Adrenals/Urinary Tract: No adrenal nodule. Heterogeneous enhancement of both kidneys with mild perinephric edema. Loss of corticomedullary differentiation on the right. No frank hydronephrosis. 4.3 cm mildly complex cyst arising from the lower pole of the left kidney, suboptimally assessed on this arterial phase exam. Thin internal septations are seen. Mild bladder distension but no wall thickening. Stomach/Bowel: No bowel obstruction or inflammation. Scattered colonic diverticula without diverticulitis. Normal  appendix. Lymphatic: Retroperitoneal adenopathy with progression from prior exam. Dominant node is at the right common iliac station measuring 2.9 cm short axis series 4, image 143. This node previously measured 2.5 cm. Lymph node at the level of the upper right kidney measures 18 mm short axis series 4, image 88. Anterior periaortic node measures 2.1 cm series 4, image 103. There also enlarged peripancreatic and periportal nodes. Mesenteric adenopathy is suboptimally assessed on this arterial phase exam, but mesenteric lymph nodes have increased in size from prior. There also ileocolic nodes are enlarged. Bilateral pelvic adenopathy, 17 mm left external iliac node series 4, image 167, 14 mm right external iliac node series 4, image 166. Reproductive: Status post hysterectomy. No adnexal masses. Other: No ascites or free air. Small fat containing umbilical hernia. Musculoskeletal: The bones are diffusely heterogeneous. No acute osseous finding. No discrete osseous lesion. Bilateral hip osteoarthritis. Review of the MIP images confirms the above findings. IMPRESSION: 1. No aortic dissection or acute aortic abnormality. Moderate aortic atherosclerosis. 2. Bulky bilateral axillary, mesenteric, and retroperitoneal adenopathy, consistent with history of CLL. Adenopathy is progressed from 2021 exam. 3. Heterogeneous enhancement of both kidneys with mild perinephric edema, suspicious for pyelonephritis. Recommend correlation with urinalysis. 4. Colonic diverticulosis without diverticulitis. Aortic Atherosclerosis (ICD10-I70.0). Electronically Signed   By: Keith Rake M.D.   On: 05/29/2022 15:17   DG Chest 2 View  Result Date: 05/29/2022 CLINICAL DATA:  Dizziness. EXAM: CHEST - 2 VIEW COMPARISON:  Chest CT dated 10/15/2019 FINDINGS: Stable enlarged heart and tortuous and calcified thoracic aorta. Mild linear scarring in the lingula without significant change. Otherwise, clear lungs with normal vascularity. Mild  thoracic spine degenerative changes. IMPRESSION: No acute abnormality. Stable cardiomegaly. Electronically Signed   By: Claudie Revering M.D.   On: 05/29/2022 12:29   CT Head Wo Contrast  Result Date: 05/29/2022 CLINICAL DATA:  Dizziness.  Numbness.  TIAs. EXAM: CT HEAD WITHOUT CONTRAST TECHNIQUE: Contiguous axial images were obtained from the base of the skull through the vertex without intravenous contrast. RADIATION DOSE REDUCTION: This exam was performed according to the departmental dose-optimization program which includes automated exposure control, adjustment of the mA and/or kV according to patient size and/or use of iterative reconstruction technique. COMPARISON:  09/15/2021 FINDINGS: Brain: No evidence of intracranial hemorrhage, acute infarction, hydrocephalus, extra-axial collection, or mass lesion/mass effect.  Old lacunar infarct noted in left superior cerebellum. Vascular:  No hyperdense vessel or other acute findings. Skull: No evidence of fracture or other significant bone abnormality. Sinuses/Orbits:  No acute findings. Other: None. IMPRESSION: No acute intracranial abnormality. Electronically Signed   By: Marlaine Hind M.D.   On: 05/29/2022 12:23    DISCHARGE EXAMINATION: Vitals:   06/17/22 2028 06/17/22 2317 06/18/22 0335 06/18/22 0731  BP: (!) 169/83 (!) 146/69 (!) 146/84 (!) 174/85  Pulse: 77 (!) 58 60 64  Resp: 18 16 16 18  $ Temp: 98.3 F (36.8 C) 98.4 F (36.9 C) (!) 97.4 F (36.3 C) 99.1 F (37.3 C)  TempSrc: Oral Oral Oral Oral  SpO2: 98% 100% 100% 95%  Weight:       General appearance: Awake alert.  In no distress Resp: Clear to auscultation bilaterally.  Normal effort Cardio: S1-S2 is normal regular.  No S3-S4.  No rubs murmurs or bruit GI: Abdomen is soft.  Nontender nondistended.  Bowel sounds are present normal.  No masses organomegaly   DISPOSITION: SNF  Discharge Instructions     Ambulatory referral to Neurology   Complete by: As directed    An appointment  is requested in approximately: 8 weeks   Call MD for:  difficulty breathing, headache or visual disturbances   Complete by: As directed    Call MD for:  extreme fatigue   Complete by: As directed    Call MD for:  persistant dizziness or light-headedness   Complete by: As directed    Call MD for:  persistant nausea and vomiting   Complete by: As directed    Call MD for:  severe uncontrolled pain   Complete by: As directed    Call MD for:  temperature >100.4   Complete by: As directed    Discharge instructions   Complete by: As directed    Please review instructions on the discharge summary.  You were cared for by a hospitalist during your hospital stay. If you have any questions about your discharge medications or the care you received while you were in the hospital after you are discharged, you can call the unit and asked to speak with the hospitalist on call if the hospitalist that took care of you is not available. Once you are discharged, your primary care physician will handle any further medical issues. Please note that NO REFILLS for any discharge medications will be authorized once you are discharged, as it is imperative that you return to your primary care physician (or establish a relationship with a primary care physician if you do not have one) for your aftercare needs so that they can reassess your need for medications and monitor your lab values. If you do not have a primary care physician, you can call [phone] for a physician referral.   Increase activity slowly   Complete by: As directed           Allergies as of 06/18/2022   No Known Allergies      Medication List     STOP taking these medications    cilostazol 50 MG tablet Commonly known as: PLETAL   clopidogrel 75 MG tablet Commonly known as: PLAVIX   insulin glargine-yfgn 100 UNIT/ML injection Commonly known as: SEMGLEE   insulin lispro 100 UNIT/ML KwikPen Commonly known as: HUMALOG    lisinopril-hydrochlorothiazide 20-12.5 MG tablet Commonly known as: ZESTORETIC       TAKE these medications    amLODipine 10 MG tablet Commonly known  as: NORVASC Take 1 tablet by mouth once daily   aspirin EC 81 MG tablet Take 1 tablet (81 mg total) by mouth daily. Swallow whole.   atorvastatin 80 MG tablet Commonly known as: Lipitor Take 1 tablet (80 mg total) by mouth daily.   Blood Pressure Monitor Automat Devi 1 Device by Does not apply route daily.   ezetimibe 10 MG tablet Commonly known as: ZETIA Take 1 tablet (10 mg total) by mouth daily.   FLUoxetine 20 MG capsule Commonly known as: PROZAC Take 1 capsule by mouth once daily   glucose blood test strip Commonly known as: OneTouch Verio USE TO CHECK BLOOD SUGAR TWICE A DAY AND PRN   glucose blood test strip Commonly known as: Contour Next Test 1 each by Other route 2 (two) times daily. And lancets 2/day   hydrALAZINE 50 MG tablet Commonly known as: APRESOLINE TAKE 1 TABLET BY MOUTH THREE TIMES DAILY   insulin aspart 100 UNIT/ML injection Commonly known as: novoLOG Inject 0-15 Units into the skin 3 (three) times daily with meals. CBG < 70: Implement Hypoglycemia Standing Orders and refer to Hypoglycemia Standing Orders sidebar report CBG 70 - 120: 0 units CBG 121 - 150: 2 units CBG 151 - 200: 3 units CBG 201 - 250: 5 units CBG 251 - 300: 8 units CBG 301 - 350: 11 units CBG 351 - 400: 15 units   lidocaine 5 % Commonly known as: LIDODERM Place 1 patch onto the skin daily. Remove & Discard patch within 12 hours or as directed by MD   loperamide 2 MG capsule Commonly known as: IMODIUM Take 2 capsules (4 mg total) by mouth 3 (three) times daily as needed for diarrhea or loose stools.   ONE TOUCH LANCETS Misc USE TO CHECK BLOOD SUGAR TWICE A DAY AND PRN   pantoprazole 40 MG tablet Commonly known as: PROTONIX Take 1 tablet (40 mg total) by mouth daily.   saccharomyces boulardii 250 MG capsule Commonly  known as: FLORASTOR Take 1 capsule (250 mg total) by mouth 2 (two) times daily.   ticagrelor 90 MG Tabs tablet Commonly known as: BRILINTA Take 1 tablet (90 mg total) by mouth 2 (two) times daily.   Trulicity [id number] Sopn Generic drug: Dulaglutide Inject 0.75 mg into the skin once a week.   Vitamin B Complex Tabs Take 1 tablet by mouth every morning.   VITAMIN D3 PO Take 1 capsule by mouth every morning.          Contact information for follow-up providers     [name], [name], MD. Schedule an appointment as soon as possible for a visit.   Specialty: Family Medicine Why: post hospitalization follow up Contact information: [place] [address] [phone]              Contact information for after-discharge care     Destination     [facility] Preferred SNF .   Service: Skilled Chiropodist information: [place] [phone]                     TOTAL DISCHARGE TIME: 35 mins  [facility] Pager on [email or web address]  06/18/2022, 9:41 AM

## 2022-06-18 NOTE — Plan of Care (Signed)
Problem: Education: Goal: Knowledge of General Education information will improve Description: Including pain rating scale, medication(s)/side effects and non-pharmacologic comfort measures Outcome: Progressing   Problem: Health Behavior/Discharge Planning: Goal: Ability to manage health-related needs will improve Outcome: Progressing   Problem: Clinical Measurements: Goal: Ability to maintain clinical measurements within normal limits will improve Outcome: Progressing Goal: Will remain free from infection Outcome: Progressing Goal: Diagnostic test results will improve Outcome: Progressing Goal: Respiratory complications will improve Outcome: Progressing Goal: Cardiovascular complication will be avoided Outcome: Progressing   Problem: Activity: Goal: Risk for activity intolerance will decrease Outcome: Progressing   Problem: Nutrition: Goal: Adequate nutrition will be maintained Outcome: Progressing   Problem: Coping: Goal: Level of anxiety will decrease Outcome: Progressing   Problem: Elimination: Goal: Will not experience complications related to bowel motility Outcome: Progressing Goal: Will not experience complications related to urinary retention Outcome: Progressing   Problem: Pain Managment: Goal: General experience of comfort will improve Outcome: Progressing   Problem: Safety: Goal: Ability to remain free from injury will improve Outcome: Progressing   Problem: Skin Integrity: Goal: Risk for impaired skin integrity will decrease Outcome: Progressing   Problem: Education: Goal: Ability to describe self-care measures that may prevent or decrease complications (Diabetes Survival Skills Education) will improve Outcome: Progressing Goal: Individualized Educational Video(s) Outcome: Progressing   Problem: Coping: Goal: Ability to adjust to condition or change in health will improve Outcome: Progressing   Problem: Fluid Volume: Goal: Ability to  maintain a balanced intake and output will improve Outcome: Progressing   Problem: Health Behavior/Discharge Planning: Goal: Ability to identify and utilize available resources and services will improve Outcome: Progressing Goal: Ability to manage health-related needs will improve Outcome: Progressing   Problem: Metabolic: Goal: Ability to maintain appropriate glucose levels will improve Outcome: Progressing   Problem: Nutritional: Goal: Maintenance of adequate nutrition will improve Outcome: Progressing Goal: Progress toward achieving an optimal weight will improve Outcome: Progressing   Problem: Skin Integrity: Goal: Risk for impaired skin integrity will decrease Outcome: Progressing   Problem: Tissue Perfusion: Goal: Adequacy of tissue perfusion will improve Outcome: Progressing   Problem: Education: Goal: Knowledge of disease or condition will improve Outcome: Progressing Goal: Knowledge of secondary prevention will improve (MUST DOCUMENT ALL) Outcome: Progressing Goal: Knowledge of patient specific risk factors will improve [place] N/A or DELETE if not current risk factor) Outcome: Progressing   Problem: Ischemic Stroke/TIA Tissue Perfusion: Goal: Complications of ischemic stroke/TIA will be minimized Outcome: Progressing   Problem: Coping: Goal: Will verbalize positive feelings about self Outcome: Progressing Goal: Will identify appropriate support needs Outcome: Progressing   Problem: Health Behavior/Discharge Planning: Goal: Ability to manage health-related needs will improve Outcome: Progressing Goal: Goals will be collaboratively established with patient/family Outcome: Progressing   Problem: Self-Care: Goal: Ability to participate in self-care as condition permits will improve Outcome: Progressing Goal: Verbalization of feelings and concerns over difficulty with self-care will improve Outcome: Progressing Goal: Ability to communicate needs accurately  will improve Outcome: Progressing   Problem: Nutrition: Goal: Risk of aspiration will decrease Outcome: Progressing Goal: Dietary intake will improve Outcome: Progressing   Problem: Education: Goal: Knowledge of disease or condition will improve Outcome: Progressing Goal: Knowledge of secondary prevention will improve (MUST DOCUMENT ALL) Outcome: Progressing Goal: Knowledge of patient specific risk factors will improve [place] N/A or DELETE if not current risk factor) Outcome: Progressing   Problem: Ischemic Stroke/TIA Tissue Perfusion: Goal: Complications of ischemic stroke/TIA will be minimized Outcome: Progressing   Problem: Coping: Goal: Will  verbalize positive feelings about self Outcome: Progressing Goal: Will identify appropriate support needs Outcome: Progressing   Problem: Health Behavior/Discharge Planning: Goal: Ability to manage health-related needs will improve Outcome: Progressing Goal: Goals will be collaboratively established with patient/family Outcome: Progressing

## 2022-06-18 NOTE — Progress Notes (Signed)
[facility]ella Rob for [employer] remains pending at this time. SW will provide updates as available.   Wandra Feinstein, MSW, LCSW [phone] (coverage)

## 2022-06-19 LAB — BASIC METABOLIC PANEL
Anion gap: 11 (ref 5–15)
BUN: 17 mg/dL (ref 8–23)
CO2: 19 mmol/L — ABNORMAL LOW (ref 22–32)
Calcium: 9.3 mg/dL (ref 8.9–10.3)
Chloride: 105 mmol/L (ref 98–111)
Creatinine, Ser: 1.12 mg/dL — ABNORMAL HIGH (ref 0.44–1.00)
GFR, Estimated: 55 mL/min — ABNORMAL LOW (ref >60)
Glucose, Bld: 172 mg/dL — ABNORMAL HIGH (ref 70–99)
Potassium: 3.7 mmol/L (ref 3.5–5.1)
Sodium: 135 mmol/L (ref 135–145)

## 2022-06-19 LAB — GLUCOSE, CAPILLARY
Glucose-Capillary: 141 mg/dL — ABNORMAL HIGH (ref 70–99)
Glucose-Capillary: 169 mg/dL — ABNORMAL HIGH (ref 70–99)
Glucose-Capillary: 171 mg/dL — ABNORMAL HIGH (ref 70–99)
Glucose-Capillary: 228 mg/dL — ABNORMAL HIGH (ref 70–99)

## 2022-06-19 NOTE — Plan of Care (Signed)
Problem: Education: Goal: Knowledge of General Education information will improve Description: Including pain rating scale, medication(s)/side effects and non-pharmacologic comfort measures Outcome: Progressing   Problem: Health Behavior/Discharge Planning: Goal: Ability to manage health-related needs will improve Outcome: Progressing   Problem: Clinical Measurements: Goal: Ability to maintain clinical measurements within normal limits will improve Outcome: Progressing Goal: Will remain free from infection Outcome: Progressing Goal: Diagnostic test results will improve Outcome: Progressing Goal: Respiratory complications will improve Outcome: Progressing Goal: Cardiovascular complication will be avoided Outcome: Progressing   Problem: Activity: Goal: Risk for activity intolerance will decrease Outcome: Progressing   Problem: Nutrition: Goal: Adequate nutrition will be maintained Outcome: Progressing   Problem: Coping: Goal: Level of anxiety will decrease Outcome: Progressing   Problem: Elimination: Goal: Will not experience complications related to bowel motility Outcome: Progressing Goal: Will not experience complications related to urinary retention Outcome: Progressing   Problem: Pain Managment: Goal: General experience of comfort will improve Outcome: Progressing   Problem: Safety: Goal: Ability to remain free from injury will improve Outcome: Progressing   Problem: Skin Integrity: Goal: Risk for impaired skin integrity will decrease Outcome: Progressing   Problem: Education: Goal: Ability to describe self-care measures that may prevent or decrease complications (Diabetes Survival Skills Education) will improve Outcome: Progressing Goal: Individualized Educational Video(s) Outcome: Progressing   Problem: Coping: Goal: Ability to adjust to condition or change in health will improve Outcome: Progressing   Problem: Fluid Volume: Goal: Ability to  maintain a balanced intake and output will improve Outcome: Progressing   Problem: Health Behavior/Discharge Planning: Goal: Ability to identify and utilize available resources and services will improve Outcome: Progressing Goal: Ability to manage health-related needs will improve Outcome: Progressing   Problem: Metabolic: Goal: Ability to maintain appropriate glucose levels will improve Outcome: Progressing   Problem: Nutritional: Goal: Maintenance of adequate nutrition will improve Outcome: Progressing Goal: Progress toward achieving an optimal weight will improve Outcome: Progressing   Problem: Skin Integrity: Goal: Risk for impaired skin integrity will decrease Outcome: Progressing   Problem: Tissue Perfusion: Goal: Adequacy of tissue perfusion will improve Outcome: Progressing   Problem: Education: Goal: Knowledge of disease or condition will improve Outcome: Progressing Goal: Knowledge of secondary prevention will improve (MUST DOCUMENT ALL) Outcome: Progressing Goal: Knowledge of patient specific risk factors will improve [place] N/A or DELETE if not current risk factor) Outcome: Progressing   Problem: Ischemic Stroke/TIA Tissue Perfusion: Goal: Complications of ischemic stroke/TIA will be minimized Outcome: Progressing   Problem: Coping: Goal: Will verbalize positive feelings about self Outcome: Progressing Goal: Will identify appropriate support needs Outcome: Progressing   Problem: Health Behavior/Discharge Planning: Goal: Ability to manage health-related needs will improve Outcome: Progressing Goal: Goals will be collaboratively established with patient/family Outcome: Progressing   Problem: Self-Care: Goal: Ability to participate in self-care as condition permits will improve Outcome: Progressing Goal: Verbalization of feelings and concerns over difficulty with self-care will improve Outcome: Progressing Goal: Ability to communicate needs accurately  will improve Outcome: Progressing   Problem: Nutrition: Goal: Risk of aspiration will decrease Outcome: Progressing Goal: Dietary intake will improve Outcome: Progressing   Problem: Education: Goal: Knowledge of disease or condition will improve Outcome: Progressing Goal: Knowledge of secondary prevention will improve (MUST DOCUMENT ALL) Outcome: Progressing Goal: Knowledge of patient specific risk factors will improve [place] N/A or DELETE if not current risk factor) Outcome: Progressing   Problem: Ischemic Stroke/TIA Tissue Perfusion: Goal: Complications of ischemic stroke/TIA will be minimized Outcome: Progressing   Problem: Coping: Goal: Will  verbalize positive feelings about self Outcome: Progressing Goal: Will identify appropriate support needs Outcome: Progressing   Problem: Health Behavior/Discharge Planning: Goal: Ability to manage health-related needs will improve Outcome: Progressing Goal: Goals will be collaboratively established with patient/family Outcome: Progressing

## 2022-06-19 NOTE — Discharge Summary (Signed)
[facility]  Physician Discharge Summary   Patient ID: [name] MRN: [id number] DOB/AGE: 1960/07/26 62 y.o.  Admit date: 06/12/2022 Discharge date:   06/19/2022   PCP: [name], [name], MD  DISCHARGE DIAGNOSES:    Acute CVA (cerebrovascular accident) [facility])   Dysarthria Aspiration pneumonitis Diarrhea, resolved Essential hypertension Insulin-dependent diabetes mellitus Peripheral artery disease Chronic diastolic CHF Microcytic anemia   RECOMMENDATIONS FOR OUTPATIENT FOLLOW UP: Please check CBC and basic metabolic panel in 3 to 4 days Ambulatory referral has been sent to neurology for outpatient follow-up See below regarding reinitiating antihypertensives.   Home Health: Needs SNF Equipment/Devices: None  CODE STATUS: Full code  DISCHARGE CONDITION: fair  Diet recommendation: Modified carbohydrate with thin liquids  INITIAL HISTORY: [name] is a 62 y.o. female with medical history significant of multiple strokes in 2022, 2023 and 2024, HTN, HLD, IDDM, PVD, non-Hodgkin's lymphoma, sickle cell trait, CKD stage IIIa, was found to have new onset of right-sided weakness and worsening of dysarthria and drooling at [facility] hospital inpatient rehab unit.   Patient admitted 2 weeks ago secondary to new onset of right medulla stroke,subsequently discharged to inpt rehab - L sided weakness/dysarthria noted at time of discharge. Staff at rehab on 06/12/22 noted seemingly new weakness of right arm and hand, when patient was unable to hold anything on her right hand as well as worsening of slurred speech as well as new left-sided drooling.  Neurology consulted, given that exact onset of her symptoms is unknown, decision made to not give TNK.  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 Admitted to hospitalist service with neurology consult.  Consultations: Neurology    HOSPITAL COURSE:   Acute extension of previous  stroke, with new onset of right-sided paresis, dysphagia and facial droop -MRI confirms "Increased size of a recent right medullary infarct which now extends to the left of midline.New punctate acute left cerebellar infarct." -Neurology following, recommending aspirin and Brilinta for 3 months followed by aspirin alone.  If there is difficulty in affording Brilinta then consider switching to aspirin and Plavix after 4 weeks. Initially there was concern about her ability to take medications orally but looks like her oral intake has improved. Initially there was plan for the patient to go back to CIR but does not appear that that is an option currently.  She needs to go to skilled nursing facility for rehab.   Probable aspiration pneumonitis Pneumonia less likely but cannot be ruled out -Secondary to above presumed worsening dysphagia - Given acuity unlikely to be infectious rather than reactive inflammatory/pneumonitis. Patient was started on Unasyn.  However patient started experiencing diarrhea.  Since her respiratory status was otherwise stable Unasyn was discontinued.  Procalcitonin was unremarkable.    Acute diarrhea Most likely due to antibiotics.  Abdomen was benign examination.  She was given Imodium and probiotics.  Antibiotics were discontinued.   Diarrhea resolved.  Did have some loose stools overnight per patient.  Denies any abdominal pain this morning.  She was given a dose of Imodium.  Will check basic metabolic panel today.  WBC was normal when last checked.  She has been afebrile.   Essential hypertension Initially allowed permissive hypertension.  Blood pressure to be gradually decreased.  Amlodipine was resumed.  Can also resume hydralazine at discharge.  Lisinopril can be reinitiated depending on blood pressures and blood work over the next 3 to 4 days.   IDDM, uncontrolled with hyperglycemia -A1c 11.4 just  last month Monitor CBG's.  Continue just SSI for now.  Glargine can  be reintroduced depending on glucose levels.     PAD -Will hold cilostazol while patient on DAPT of aspirin and Brilinta   CKD stage IIIa -Euvolemic, creatinine level stable   Chronic HFpEF Stable for the most part.   Microcytic anemia No evidence of overt bleeding.  Outpatient management.    Obesity Estimated body mass index is 31.9 kg/m as calculated from the following:   Height as of 06/04/22: 5' 4"$  (1.626 m).   Weight as of this encounter: 84.3 kg.  Patient mentioned episodes of loose stool overnight.  Only 1 charted though.  Will confirm with nursing staff.  Imodium has been given.  Abdomen remains benign.  Check basic metabolic panel.  Patient otherwise remained stable.  Okay for discharge to SNF if BMET is unremarkable.     PERTINENT LABS:  The results of significant diagnostics from this hospitalization (including imaging, microbiology, ancillary and laboratory) are listed below for reference.    Labs:   Basic Metabolic Panel: Recent Labs  Lab 06/12/22 1328 06/13/22 0720 06/16/22 0633 06/17/22 0702  NA 138 142 137 137  K 4.1 4.1 3.8 3.8  CL 111 113* 105 107  CO2 21* 20* 18* 20*  GLUCOSE 104* 106* 162* 126*  BUN 24* 20 18 19  $ CREATININE 1.30* 1.22* 1.16* 1.20*  CALCIUM 9.6 9.5 9.4 9.3   Liver Function Tests: Recent Labs  Lab 06/17/22 0702  AST 27  ALT 42  ALKPHOS 83  BILITOT 0.3  PROT 6.1*  ALBUMIN 2.2*    CBC: Recent Labs  Lab 06/12/22 1328 06/13/22 0720 06/16/22 0633 06/17/22 0702  WBC 7.0 6.1 14.3* 10.1  HGB 11.0* 10.6* 11.5* 10.7*  HCT 32.9* 32.4* 35.0* 33.1*  MCV 78.3* 78.6* 77.4* 77.5*  PLT 244 247 271 240     CBG: Recent Labs  Lab 06/18/22 0610 06/18/22 1202 06/18/22 1611 06/18/22 2210 06/19/22 0627  GLUCAP 130* 162* 163* 158* 141*     IMAGING STUDIES DG Chest 1 View  Result Date: 06/13/2022 CLINICAL DATA:  Aspiration pneumonia EXAM: CHEST  1 VIEW COMPARISON:  Radiograph 06/12/2022 FINDINGS: Unchanged  cardiomediastinal silhouette. Bandlike atelectasis in the lower lungs. No new airspace disease. No pleural effusion or evidence of pneumothorax. Bones are unchanged. IMPRESSION: No new airspace disease. Electronically Signed   By: Maurine Simmering M.D.   On: 06/13/2022 07:58   DG Chest 1 View  Result Date: 06/12/2022 CLINICAL DATA:  Pneumonia EXAM: CHEST  1 VIEW COMPARISON:  Chest x-ray 05/29/2022 FINDINGS: Cardiomediastinal silhouette is within normal limits. There is a linear band of atelectasis or scarring in the left mid lung similar to prior. Lungs are otherwise clear. No pleural effusion or pneumothorax. No acute fractures. IMPRESSION: No acute cardiopulmonary process.  No significant interval change. Electronically Signed   By: Ronney Asters M.D.   On: 06/12/2022 16:17   MR BRAIN WO CONTRAST  Result Date: 06/12/2022 CLINICAL DATA:  Neuro deficit, acute, stroke suspected. New right sided weakness, worsening aphasia. EXAM: MRI HEAD WITHOUT CONTRAST TECHNIQUE: Multiplanar, multiecho pulse sequences of the brain and surrounding structures were obtained without intravenous contrast. COMPARISON:  Head CT 06/11/2022 and MRI/MRA 05/30/2022 FINDINGS: Multiple sequences are up to moderately motion degraded. Brain: The acute infarct in the right ventral medulla on the prior MRI has enlarged and now extends to the left of midline. There is also a new punctate acute infarct in the left cerebellar hemisphere. Small  T2 hyperintensities in the cerebral white matter bilaterally are unchanged and nonspecific but compatible with mild chronic small vessel ischemic disease chronic microhemorrhages are again noted in the right thalamus and right parietal white matter. Small chronic bilateral cerebellar infarcts are again noted with hemosiderin deposition associated with 1 of the left-sided infarcts. The ventricles and sulci are normal. No mass, midline shift, or extra-axial fluid collection is identified. Vascular: Unchanged  abnormal appearance of the distal right vertebral artery, more fully evaluated on the prior MRA. Skull and upper cervical spine: No suspicious marrow lesion. Sinuses/Orbits: Unremarkable orbits. Mucous retention cyst in the right maxillary sinus. Small left mastoid effusion. Other: Partially visualized bilateral cervical lymphadenopathy with history of lymphoma. IMPRESSION: 1. Increased size of a recent right medullary infarct which now extends to the left of midline. 2. New punctate acute left cerebellar infarct. 3. Mild chronic small vessel ischemic disease. Chronic cerebellar infarcts. These results will be called to the ordering clinician or representative by the Radiologist Assistant, and communication documented in the PACS or [employer]. Electronically Signed   By: Logan Bores M.D.   On: 06/12/2022 13:24   CT HEAD WO CONTRAST (5MM)  Result Date: 06/11/2022 CLINICAL DATA:  Follow-up examination for stroke, slurred speech. EXAM: CT HEAD WITHOUT CONTRAST TECHNIQUE: Contiguous axial images were obtained from the base of the skull through the vertex without intravenous contrast. RADIATION DOSE REDUCTION: This exam was performed according to the departmental dose-optimization program which includes automated exposure control, adjustment of the mA and/or kV according to patient size and/or use of iterative reconstruction technique. COMPARISON:  Comparison made with prior CT and MRI from 05/30/2022. FINDINGS: Brain: Cerebral volume within normal limits for age. Mild chronic small vessel ischemic disease noted. Previously identified small medullary infarct not visible by CT. No other acute large vessel territory infarct. No intracranial hemorrhage. No mass lesion or midline shift. No hydrocephalus or extra-axial fluid collection. Vascular: No abnormal hyperdense vessel. Calcified atherosclerosis present about the skull base. Skull: Scalp soft tissues and calvarium within normal limits. Sinuses/Orbits:  Globes and orbital soft tissues demonstrate no acute finding. Paranasal sinuses remain largely clear. No mastoid effusion. Other: None. IMPRESSION: 1. No acute intracranial abnormality. 2. Mild chronic small vessel ischemic disease. Previously identified small medullary infarct not visible by CT. Electronically Signed   By: Jeannine Boga M.D.   On: 06/11/2022 21:07   ECHOCARDIOGRAM COMPLETE  Result Date: 05/31/2022    ECHOCARDIOGRAM REPORT   Patient Name:   DAESHAWNA AMESCUA Date of Exam: 05/31/2022 Medical Rec #:  [id number]       Height:       64.0 in Accession #:    [id number]      Weight:       179.0 lb Date of Birth:  1961/03/07       BSA:          1.866 m Patient Age:    56 years        BP:           168/93 mmHg Patient Gender: F               HR:           72 bpm. Exam Location:  Inpatient Procedure: 2D Echo, Cardiac Doppler and Color Doppler Indications:    Stroke  History:        Patient has prior history of Echocardiogram examinations.  Stroke; Risk Factors:Hypertension, Diabetes and Dyslipidemia.  Sonographer:    Phineas Douglas Referring Phys: [phone] AMRIT ADHIKARI IMPRESSIONS  1. Left ventricular ejection fraction, by estimation, is 60 to 65%. The left ventricle has normal function. The left ventricle has no regional wall motion abnormalities. There is mild concentric left ventricular hypertrophy. Left ventricular diastolic parameters are consistent with Grade I diastolic dysfunction (impaired relaxation).  2. Right ventricular systolic function is normal. The right ventricular size is normal. Tricuspid regurgitation signal is inadequate for assessing PA pressure.  3. The mitral valve is grossly normal. No evidence of mitral valve regurgitation. No evidence of mitral stenosis.  4. The aortic valve is tricuspid. Aortic valve regurgitation is not visualized. No aortic stenosis is present.  5. The inferior vena cava is normal in size with greater than 50% respiratory variability,  suggesting right atrial pressure of 3 mmHg. Conclusion(s)/Recommendation(s): No intracardiac source of embolism detected on this transthoracic study. Consider a transesophageal echocardiogram to exclude cardiac source of embolism if clinically indicated. FINDINGS  Left Ventricle: Left ventricular ejection fraction, by estimation, is 60 to 65%. The left ventricle has normal function. The left ventricle has no regional wall motion abnormalities. The left ventricular internal cavity size was normal in size. There is  mild concentric left ventricular hypertrophy. Left ventricular diastolic parameters are consistent with Grade I diastolic dysfunction (impaired relaxation). Right Ventricle: The right ventricular size is normal. No increase in right ventricular wall thickness. Right ventricular systolic function is normal. Tricuspid regurgitation signal is inadequate for assessing PA pressure. Left Atrium: Left atrial size was normal in size. Right Atrium: Right atrial size was normal in size. Pericardium: Trivial pericardial effusion is present. Mitral Valve: The mitral valve is grossly normal. No evidence of mitral valve regurgitation. No evidence of mitral valve stenosis. Tricuspid Valve: The tricuspid valve is grossly normal. Tricuspid valve regurgitation is trivial. No evidence of tricuspid stenosis. Aortic Valve: The aortic valve is tricuspid. Aortic valve regurgitation is not visualized. No aortic stenosis is present. Pulmonic Valve: The pulmonic valve was grossly normal. Pulmonic valve regurgitation is trivial. No evidence of pulmonic stenosis. Aorta: The aortic root and ascending aorta are structurally normal, with no evidence of dilitation. Venous: The inferior vena cava is normal in size with greater than 50% respiratory variability, suggesting right atrial pressure of 3 mmHg. IAS/Shunts: The atrial septum is grossly normal.  LEFT VENTRICLE PLAX 2D LVIDd:         3.60 cm      Diastology LVIDs:         2.30 cm       LV e' medial:    5.68 cm/s LV PW:         1.30 cm      LV E/e' medial:  12.4 LV IVS:        1.20 cm      LV e' lateral:   7.22 cm/s LVOT diam:     2.00 cm      LV E/e' lateral: 9.8 LV SV:         69 LV SV Index:   37 LVOT Area:     3.14 cm  LV Volumes (MOD) LV vol d, MOD A2C: 119.0 ml LV vol d, MOD A4C: 111.0 ml LV vol s, MOD A2C: 44.0 ml LV vol s, MOD A4C: 48.0 ml LV SV MOD A2C:     75.0 ml LV SV MOD A4C:     111.0 ml LV SV MOD BP:  68.6 ml RIGHT VENTRICLE             IVC RV Basal diam:  3.10 cm     IVC diam: 1.40 cm RV S prime:     13.90 cm/s TAPSE (M-mode): 1.8 cm LEFT ATRIUM             Index        RIGHT ATRIUM           Index LA diam:        3.70 cm 1.98 cm/m   RA Area:     12.00 cm LA Vol (A2C):   51.4 ml 27.54 ml/m  RA Volume:   23.00 ml  12.32 ml/m LA Vol (A4C):   56.6 ml 30.33 ml/m LA Biplane Vol: 58.1 ml 31.13 ml/m  AORTIC VALVE LVOT Vmax:   93.20 cm/s LVOT Vmean:  62.800 cm/s LVOT VTI:    0.220 m  AORTA Ao Root diam: 3.10 cm Ao Asc diam:  3.20 cm MITRAL VALVE MV Area (PHT): 2.77 cm    SHUNTS MV Decel Time: 274 msec    Systemic VTI:  0.22 m MV E velocity: 70.70 cm/s  Systemic Diam: 2.00 cm MV A velocity: 95.50 cm/s MV E/A ratio:  0.74 Eleonore Chiquito MD Electronically signed by Eleonore Chiquito MD Signature Date/Time: 05/31/2022/9:44:38 AM    Final    CT HEAD WO CONTRAST (5MM)  Result Date: 05/30/2022 CLINICAL DATA:  Follow-up stroke EXAM: CT HEAD WITHOUT CONTRAST TECHNIQUE: Contiguous axial images were obtained from the base of the skull through the vertex without intravenous contrast. RADIATION DOSE REDUCTION: This exam was performed according to the departmental dose-optimization program which includes automated exposure control, adjustment of the mA and/or kV according to patient size and/or use of iterative reconstruction technique. COMPARISON:  MRI brain dated 05/30/2022 at 1031 hours FINDINGS: Brain: No evidence of acute infarction, hemorrhage, hydrocephalus, extra-axial collection or  mass lesion/mass effect. Known tiny acute infarct within the right medulla is not evident on current CT. Mild subcortical white matter and periventricular small vessel ischemic changes. Vascular: Intracranial atherosclerosis. Skull: Normal. Negative for fracture or focal lesion. Sinuses/Orbits: The visualized paranasal sinuses are essentially clear. The mastoid air cells are unopacified. Other: None. IMPRESSION: Known tiny acute infarct within the right medulla is not evident on current CT. Mild small vessel ischemic changes. Electronically Signed   By: Julian Hy M.D.   On: 05/30/2022 23:55   MR ANGIO HEAD WO CONTRAST  Result Date: 05/30/2022 CLINICAL DATA:  History of non-Hodgkin's lymphoma, hypertension, Guillain-Barre syndrome, and multiple strokes. Complains of dizziness and overall weakness. Possible CVA. EXAM: MRI HEAD WITHOUT CONTRAST MRA HEAD WITHOUT CONTRAST MRA NECK WITHOUT AND WITH CONTRAST TECHNIQUE: Multiplanar, multi-echo pulse sequences of the brain and surrounding structures were acquired without intravenous contrast. Angiographic images of the Circle of Willis were acquired using MRA technique without intravenous contrast. Angiographic images of the neck were acquired using MRA technique without and with intravenous contrast. Carotid stenosis measurements (when applicable) are obtained utilizing NASCET criteria, using the distal internal carotid diameter as the denominator. CONTRAST:  100m GADAVIST GADOBUTROL 1 MMOL/ML IV SOLN COMPARISON:  None Available. CT head 1 day prior, CTA head/neck 09/15/2021 FINDINGS: MRI HEAD FINDINGS Brain: There is diffusion restriction in the right aspect of the medulla with faint associated FLAIR signal abnormality consistent with acute infarct (5-9). There is no associated hemorrhage or mass effect. There is no other evidence of acute infarct. There is no acute intracranial hemorrhage or extra-axial  fluid collection. Parenchymal volume is normal. The  ventricles are normal in size. Scattered foci of FLAIR signal abnormality in the supratentorial white matter are nonspecific but likely reflects sequela of underlying chronic small-vessel ischemic change, similar to the MRI from 2023. There are small remote infarcts in the bilateral cerebellar hemispheres, at least 1 of which in the left cerebellar hemisphere is new since 09/15/2021. There are punctate chronic microhemorrhages in the right thalamus and right parietal white matter, nonspecific but possibly hypertensive. The pituitary and suprasellar region are normal. There is no mass lesion. There is no mass effect or midline shift. Vascular: See below. Skull and upper cervical spine: Normal marrow signal. Sinuses/Orbits: The paranasal sinuses are clear. The globes and orbits are unremarkable. Other: There are prominent lymph nodes in the imaged upper neck on the coronal diffusion sequence which appear overall decreased in size since the prior MRI from 2023 though are incompletely evaluated. MRA HEAD FINDINGS Anterior circulation: There is atherosclerotic irregularity of the carotid siphons resulting in moderate stenosis of the cavernous segment on the left and mild-to-moderate stenosis of the supraclinoid segment on the right. The left M1 segment is patent. There is moderate to severe stenosis of the origin of the superior left M2 division (7-156). The distal left MCA branches appear patent. There is severe stenosis of the distal right M1 segment (1036-3). There is multifocal moderate narrowing of the proximal right superior M2 division. The distal right MCA branches otherwise appear patent. The bilateral ACAs are patent, without proximal stenosis or occlusion. The anterior communicating artery is not definitely identified. There is no aneurysm or AVM. Posterior circulation: There is no flow related enhancement of the right V4 segment on the time-of-flight MRA head. There is intermittent enhancement on the  postcontrast MRA neck images. Findings are similar to the CTA from 2023. The PICA origin is not seen on the right. There is multifocal severe stenosis/near occlusion of the left V4 segment, also similar to the prior CT. PICA is identified on the left. The basilar artery is patent with atherosclerotic irregularity and narrowing. There is severe stenosis of the origin of the left P1 segment. There is multifocal atherosclerotic irregularity and narrowing in the remainder of the left PCA without other high-grade stenosis or occlusion. The right PCA is patent with mild atherosclerotic irregularity but no high-grade stenosis or occlusion. Findings are similar to the prior CTA. There is no aneurysm or AVM. Anatomic variants: None. MRA NECK FINDINGS Aortic arch: The imaged aortic arch is normal. The origins of the major branch vessels are patent. The subclavian arteries are patent to the level imaged. 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 Right carotid system: The right common, internal, and external carotid arteries are patent, with mild irregularity at the bifurcation but no hemodynamically significant stenosis or occlusion. There is no evidence of dissection or aneurysm. Left carotid system: The left common, internal, and external carotid arteries are patent, without significant stenosis or occlusion. There is no evidence of dissection or aneurysm. Vertebral arteries: The right vertebral artery appears patent at its origin. There is short-segment high-grade stenosis/occlusion of the distal V2 segment (15-60). There is diminutive reconstitution with further occlusion of the V3 segment. There is moderate stenosis of the left vertebral artery origin (15-52). The left vertebral artery is otherwise patent in the neck without other hemodynamically significant stenosis or occlusion. There is no evidence of dissection or aneurysm. There is antegrade flow in both vertebral arteries. Other: None  IMPRESSION: MR  HEAD 1. Small acute infarct in the right aspect of the medulla without hemorrhage or mass effect. 2. Background chronic small-vessel ischemic change with small remote infarcts in the bilateral cerebellar hemispheres, one of which on the left is new since 09/15/2021. 3. Prominent lymph nodes in the upper neck consistent with the history of lymphoma, overall decreased in size since the prior studies from 09/15/2021 but incompletely evaluated. MRA HEAD 1. Diminutive enhancement of the right V4 segment with intermittent occlusion, and multifocal severe stenosis/near occlusion of the left V4 segment, similar to the prior CTA. 2. Additional intracranial atherosclerotic disease as detailed above resulting in moderate stenosis of the left cavernous ICA, mild-to-moderate stenosis of the right supraclinoid ICA, severe stenosis of the origin of the left superior M2 division, severe stenosis of the distal right M1 segment, and severe stenosis of the proximal left P1 segment. MRA NECK 1. 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 2. Moderate stenosis of the origin of the left vertebral artery, not significantly changed. 3. Patent carotid systems bilaterally. Electronically Signed   By: Valetta Mole M.D.   On: 05/30/2022 12:01   MR BRAIN WO CONTRAST  Result Date: 05/30/2022 CLINICAL DATA:  History of non-Hodgkin's lymphoma, hypertension, Guillain-Barre syndrome, and multiple strokes. Complains of dizziness and overall weakness. Possible CVA. EXAM: MRI HEAD WITHOUT CONTRAST MRA HEAD WITHOUT CONTRAST MRA NECK WITHOUT AND WITH CONTRAST TECHNIQUE: Multiplanar, multi-echo pulse sequences of the brain and surrounding structures were acquired without intravenous contrast. Angiographic images of the Circle of Willis were acquired using MRA technique without intravenous contrast. Angiographic images of the neck were acquired using MRA technique without and with intravenous  contrast. Carotid stenosis measurements (when applicable) are obtained utilizing NASCET criteria, using the distal internal carotid diameter as the denominator. CONTRAST:  35m GADAVIST GADOBUTROL 1 MMOL/ML IV SOLN COMPARISON:  None Available. CT head 1 day prior, CTA head/neck 09/15/2021 FINDINGS: MRI HEAD FINDINGS Brain: There is diffusion restriction in the right aspect of the medulla with faint associated FLAIR signal abnormality consistent with acute infarct (5-9). There is no associated hemorrhage or mass effect. There is no other evidence of acute infarct. There is no acute intracranial hemorrhage or extra-axial fluid collection. Parenchymal volume is normal. The ventricles are normal in size. Scattered foci of FLAIR signal abnormality in the supratentorial white matter are nonspecific but likely reflects sequela of underlying chronic small-vessel ischemic change, similar to the MRI from 2023. There are small remote infarcts in the bilateral cerebellar hemispheres, at least 1 of which in the left cerebellar hemisphere is new since 09/15/2021. There are punctate chronic microhemorrhages in the right thalamus and right parietal white matter, nonspecific but possibly hypertensive. The pituitary and suprasellar region are normal. There is no mass lesion. There is no mass effect or midline shift. Vascular: See below. Skull and upper cervical spine: Normal marrow signal. Sinuses/Orbits: The paranasal sinuses are clear. The globes and orbits are unremarkable. Other: There are prominent lymph nodes in the imaged upper neck on the coronal diffusion sequence which appear overall decreased in size since the prior MRI from 2023 though are incompletely evaluated. MRA HEAD FINDINGS Anterior circulation: There is atherosclerotic irregularity of the carotid siphons resulting in moderate stenosis of the cavernous segment on the left and mild-to-moderate stenosis of the supraclinoid segment on the right. The left M1 segment is  patent. There is moderate to severe stenosis of the origin of the superior left M2 division (7-156). The distal  left MCA branches appear patent. There is severe stenosis of the distal right M1 segment (1036-3). There is multifocal moderate narrowing of the proximal right superior M2 division. The distal right MCA branches otherwise appear patent. The bilateral ACAs are patent, without proximal stenosis or occlusion. The anterior communicating artery is not definitely identified. There is no aneurysm or AVM. Posterior circulation: There is no flow related enhancement of the right V4 segment on the time-of-flight MRA head. There is intermittent enhancement on the postcontrast MRA neck images. Findings are similar to the CTA from 2023. The PICA origin is not seen on the right. There is multifocal severe stenosis/near occlusion of the left V4 segment, also similar to the prior CT. PICA is identified on the left. The basilar artery is patent with atherosclerotic irregularity and narrowing. There is severe stenosis of the origin of the left P1 segment. There is multifocal atherosclerotic irregularity and narrowing in the remainder of the left PCA without other high-grade stenosis or occlusion. The right PCA is patent with mild atherosclerotic irregularity but no high-grade stenosis or occlusion. Findings are similar to the prior CTA. There is no aneurysm or AVM. Anatomic variants: None. MRA NECK FINDINGS Aortic arch: The imaged aortic arch is normal. The origins of the major branch vessels are patent. The subclavian arteries are patent to the level imaged. 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 Right carotid system: The right common, internal, and external carotid arteries are patent, with mild irregularity at the bifurcation but no hemodynamically significant stenosis or occlusion. There is no evidence of dissection or aneurysm. Left carotid system: The left common, internal, and external carotid  arteries are patent, without significant stenosis or occlusion. There is no evidence of dissection or aneurysm. Vertebral arteries: The right vertebral artery appears patent at its origin. There is short-segment high-grade stenosis/occlusion of the distal V2 segment (15-60). There is diminutive reconstitution with further occlusion of the V3 segment. There is moderate stenosis of the left vertebral artery origin (15-52). The left vertebral artery is otherwise patent in the neck without other hemodynamically significant stenosis or occlusion. There is no evidence of dissection or aneurysm. There is antegrade flow in both vertebral arteries. Other: None IMPRESSION: MR HEAD 1. Small acute infarct in the right aspect of the medulla without hemorrhage or mass effect. 2. Background chronic small-vessel ischemic change with small remote infarcts in the bilateral cerebellar hemispheres, one of which on the left is new since 09/15/2021. 3. Prominent lymph nodes in the upper neck consistent with the history of lymphoma, overall decreased in size since the prior studies from 09/15/2021 but incompletely evaluated. MRA HEAD 1. Diminutive enhancement of the right V4 segment with intermittent occlusion, and multifocal severe stenosis/near occlusion of the left V4 segment, similar to the prior CTA. 2. Additional intracranial atherosclerotic disease as detailed above resulting in moderate stenosis of the left cavernous ICA, mild-to-moderate stenosis of the right supraclinoid ICA, severe stenosis of the origin of the left superior M2 division, severe stenosis of the distal right M1 segment, and severe stenosis of the proximal left P1 segment. MRA NECK 1. 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 2. Moderate stenosis of the origin of the left vertebral artery, not significantly changed. 3. Patent carotid systems bilaterally. Electronically Signed   By: Valetta Mole M.D.   On: 05/30/2022 12:01    MR ANGIO NECK W WO CONTRAST  Result Date: 05/30/2022 CLINICAL DATA:  History of non-Hodgkin's  lymphoma, hypertension, Guillain-Barre syndrome, and multiple strokes. Complains of dizziness and overall weakness. Possible CVA. EXAM: MRI HEAD WITHOUT CONTRAST MRA HEAD WITHOUT CONTRAST MRA NECK WITHOUT AND WITH CONTRAST TECHNIQUE: Multiplanar, multi-echo pulse sequences of the brain and surrounding structures were acquired without intravenous contrast. Angiographic images of the Circle of Willis were acquired using MRA technique without intravenous contrast. Angiographic images of the neck were acquired using MRA technique without and with intravenous contrast. Carotid stenosis measurements (when applicable) are obtained utilizing NASCET criteria, using the distal internal carotid diameter as the denominator. CONTRAST:  65m GADAVIST GADOBUTROL 1 MMOL/ML IV SOLN COMPARISON:  None Available. CT head 1 day prior, CTA head/neck 09/15/2021 FINDINGS: MRI HEAD FINDINGS Brain: There is diffusion restriction in the right aspect of the medulla with faint associated FLAIR signal abnormality consistent with acute infarct (5-9). There is no associated hemorrhage or mass effect. There is no other evidence of acute infarct. There is no acute intracranial hemorrhage or extra-axial fluid collection. Parenchymal volume is normal. The ventricles are normal in size. Scattered foci of FLAIR signal abnormality in the supratentorial white matter are nonspecific but likely reflects sequela of underlying chronic small-vessel ischemic change, similar to the MRI from 2023. There are small remote infarcts in the bilateral cerebellar hemispheres, at least 1 of which in the left cerebellar hemisphere is new since 09/15/2021. There are punctate chronic microhemorrhages in the right thalamus and right parietal white matter, nonspecific but possibly hypertensive. The pituitary and suprasellar region are normal. There is no mass lesion. There is no  mass effect or midline shift. Vascular: See below. Skull and upper cervical spine: Normal marrow signal. Sinuses/Orbits: The paranasal sinuses are clear. The globes and orbits are unremarkable. Other: There are prominent lymph nodes in the imaged upper neck on the coronal diffusion sequence which appear overall decreased in size since the prior MRI from 2023 though are incompletely evaluated. MRA HEAD FINDINGS Anterior circulation: There is atherosclerotic irregularity of the carotid siphons resulting in moderate stenosis of the cavernous segment on the left and mild-to-moderate stenosis of the supraclinoid segment on the right. The left M1 segment is patent. There is moderate to severe stenosis of the origin of the superior left M2 division (7-156). The distal left MCA branches appear patent. There is severe stenosis of the distal right M1 segment (1036-3). There is multifocal moderate narrowing of the proximal right superior M2 division. The distal right MCA branches otherwise appear patent. The bilateral ACAs are patent, without proximal stenosis or occlusion. The anterior communicating artery is not definitely identified. There is no aneurysm or AVM. Posterior circulation: There is no flow related enhancement of the right V4 segment on the time-of-flight MRA head. There is intermittent enhancement on the postcontrast MRA neck images. Findings are similar to the CTA from 2023. The PICA origin is not seen on the right. There is multifocal severe stenosis/near occlusion of the left V4 segment, also similar to the prior CT. PICA is identified on the left. The basilar artery is patent with atherosclerotic irregularity and narrowing. There is severe stenosis of the origin of the left P1 segment. There is multifocal atherosclerotic irregularity and narrowing in the remainder of the left PCA without other high-grade stenosis or occlusion. The right PCA is patent with mild atherosclerotic irregularity but no high-grade  stenosis or occlusion. Findings are similar to the prior CTA. There is no aneurysm or AVM. Anatomic variants: None. MRA NECK FINDINGS Aortic arch: The imaged aortic arch is normal. The origins of  the major branch vessels are patent. The subclavian arteries are patent to the level imaged. 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 Right carotid system: The right common, internal, and external carotid arteries are patent, with mild irregularity at the bifurcation but no hemodynamically significant stenosis or occlusion. There is no evidence of dissection or aneurysm. Left carotid system: The left common, internal, and external carotid arteries are patent, without significant stenosis or occlusion. There is no evidence of dissection or aneurysm. Vertebral arteries: The right vertebral artery appears patent at its origin. There is short-segment high-grade stenosis/occlusion of the distal V2 segment (15-60). There is diminutive reconstitution with further occlusion of the V3 segment. There is moderate stenosis of the left vertebral artery origin (15-52). The left vertebral artery is otherwise patent in the neck without other hemodynamically significant stenosis or occlusion. There is no evidence of dissection or aneurysm. There is antegrade flow in both vertebral arteries. Other: None IMPRESSION: MR HEAD 1. Small acute infarct in the right aspect of the medulla without hemorrhage or mass effect. 2. Background chronic small-vessel ischemic change with small remote infarcts in the bilateral cerebellar hemispheres, one of which on the left is new since 09/15/2021. 3. Prominent lymph nodes in the upper neck consistent with the history of lymphoma, overall decreased in size since the prior studies from 09/15/2021 but incompletely evaluated. MRA HEAD 1. Diminutive enhancement of the right V4 segment with intermittent occlusion, and multifocal severe stenosis/near occlusion of the left V4 segment, similar  to the prior CTA. 2. Additional intracranial atherosclerotic disease as detailed above resulting in moderate stenosis of the left cavernous ICA, mild-to-moderate stenosis of the right supraclinoid ICA, severe stenosis of the origin of the left superior M2 division, severe stenosis of the distal right M1 segment, and severe stenosis of the proximal left P1 segment. MRA NECK 1. 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 2. Moderate stenosis of the origin of the left vertebral artery, not significantly changed. 3. Patent carotid systems bilaterally. Electronically Signed   By: Valetta Mole M.D.   On: 05/30/2022 12:01   CT Angio Chest/Abd/Pel for Dissection W and/or W/WO  Result Date: 05/29/2022 CLINICAL DATA:  numbness, tingling ,dizziness, hypertensive Radiologic records indicates history of CLL. EXAM: CT ANGIOGRAPHY CHEST, ABDOMEN AND PELVIS TECHNIQUE: Non-contrast CT of the chest was initially obtained. Multidetector CT imaging through the chest, abdomen and pelvis was performed using the standard protocol during bolus administration of intravenous contrast. Multiplanar reconstructed images and MIPs were obtained and reviewed to evaluate the vascular anatomy. RADIATION DOSE REDUCTION: This exam was performed according to the departmental dose-optimization program which includes automated exposure control, adjustment of the mA and/or kV according to patient size and/or use of iterative reconstruction technique. CONTRAST:  48m OMNIPAQUE IOHEXOL 350 MG/ML SOLN COMPARISON:  Chest radiograph earlier today. Chest, abdomen, pelvis CT 10/15/2019 FINDINGS: CTA CHEST FINDINGS Cardiovascular: No aortic hematoma on this unenhanced exam. Thoracic aorta is normal in caliber with mild tortuosity. Mild aortic atherosclerosis. No dissection, aneurysm or acute aortic findings. Aberrant right subclavian artery. No central pulmonary embolus on this exam not tailored to pulmonary artery  assessment. Borderline cardiomegaly. Minimal pericardial effusion. Mediastinum/Nodes: No enlarged mediastinal or hilar lymph nodes. There is bulky bilateral axillary adenopathy. Index right axillary node measures 2.1 cm short axis series 4, image 21. Left axillary node measures 2.5 cm short axis series 4, image 33. 11 mm left supraclavicular node is stable. Similar appearance of  heterogeneous right lobe of the thyroid. No esophageal wall thickening. Lungs/Pleura: Minor atelectasis in the lingula. No other focal airspace disease. No pleural fluid. No features of pulmonary edema. No pulmonary mass or nodule. The trachea and central airways are patent. Musculoskeletal: Heterogeneous appearance of the osseous structures but no discrete focal lesion. No acute osseous findings. Review of the MIP images confirms the above findings. CTA ABDOMEN AND PELVIS FINDINGS VASCULAR Aorta: Moderate atherosclerosis. Normal in caliber without aneurysm, dissection, vasculitis or significant stenosis. Celiac: Mild plaque at the origin with minimal stenosis. Branch vessels are patent. No dissection or acute findings. SMA: Patent without evidence of aneurysm, dissection, vasculitis or significant stenosis. Renals: Single bilateral renal arteries. Mild calcified plaque at the origin of the both renal arteries with minimal stenosis. No dissection or acute findings. IMA: Patent without evidence of aneurysm, dissection, vasculitis or significant stenosis. Inflow: Moderate atherosclerosis. No aneurysm, dissection or acute findings. Veins: No obvious venous abnormality within the limitations of this arterial phase study. Review of the MIP images confirms the above findings. NON-VASCULAR Hepatobiliary: No focal hepatic abnormality on this arterial phase exam. Or gallbladder Pancreas: No ductal dilatation or inflammation. Spleen: Normal in size and arterial enhancement. Adrenals/Urinary Tract: No adrenal nodule. Heterogeneous enhancement of both  kidneys with mild perinephric edema. Loss of corticomedullary differentiation on the right. No frank hydronephrosis. 4.3 cm mildly complex cyst arising from the lower pole of the left kidney, suboptimally assessed on this arterial phase exam. Thin internal septations are seen. Mild bladder distension but no wall thickening. Stomach/Bowel: No bowel obstruction or inflammation. Scattered colonic diverticula without diverticulitis. Normal appendix. Lymphatic: Retroperitoneal adenopathy with progression from prior exam. Dominant node is at the right common iliac station measuring 2.9 cm short axis series 4, image 143. This node previously measured 2.5 cm. Lymph node at the level of the upper right kidney measures 18 mm short axis series 4, image 88. Anterior periaortic node measures 2.1 cm series 4, image 103. There also enlarged peripancreatic and periportal nodes. Mesenteric adenopathy is suboptimally assessed on this arterial phase exam, but mesenteric lymph nodes have increased in size from prior. There also ileocolic nodes are enlarged. Bilateral pelvic adenopathy, 17 mm left external iliac node series 4, image 167, 14 mm right external iliac node series 4, image 166. Reproductive: Status post hysterectomy. No adnexal masses. Other: No ascites or free air. Small fat containing umbilical hernia. Musculoskeletal: The bones are diffusely heterogeneous. No acute osseous finding. No discrete osseous lesion. Bilateral hip osteoarthritis. Review of the MIP images confirms the above findings. IMPRESSION: 1. No aortic dissection or acute aortic abnormality. Moderate aortic atherosclerosis. 2. Bulky bilateral axillary, mesenteric, and retroperitoneal adenopathy, consistent with history of CLL. Adenopathy is progressed from 2021 exam. 3. Heterogeneous enhancement of both kidneys with mild perinephric edema, suspicious for pyelonephritis. Recommend correlation with urinalysis. 4. Colonic diverticulosis without diverticulitis.  Aortic Atherosclerosis (ICD10-I70.0). Electronically Signed   By: Keith Rake M.D.   On: 05/29/2022 15:17   DG Chest 2 View  Result Date: 05/29/2022 CLINICAL DATA:  Dizziness. EXAM: CHEST - 2 VIEW COMPARISON:  Chest CT dated 10/15/2019 FINDINGS: Stable enlarged heart and tortuous and calcified thoracic aorta. Mild linear scarring in the lingula without significant change. Otherwise, clear lungs with normal vascularity. Mild thoracic spine degenerative changes. IMPRESSION: No acute abnormality. Stable cardiomegaly. Electronically Signed   By: Claudie Revering M.D.   On: 05/29/2022 12:29   CT Head Wo Contrast  Result Date: 05/29/2022 CLINICAL DATA:  Dizziness.  Numbness.  TIAs. EXAM: CT HEAD WITHOUT CONTRAST TECHNIQUE: Contiguous axial images were obtained from the base of the skull through the vertex without intravenous contrast. RADIATION DOSE REDUCTION: This exam was performed according to the departmental dose-optimization program which includes automated exposure control, adjustment of the mA and/or kV according to patient size and/or use of iterative reconstruction technique. COMPARISON:  09/15/2021 FINDINGS: Brain: No evidence of intracranial hemorrhage, acute infarction, hydrocephalus, extra-axial collection, or mass lesion/mass effect. Old lacunar infarct noted in left superior cerebellum. Vascular:  No hyperdense vessel or other acute findings. Skull: No evidence of fracture or other significant bone abnormality. Sinuses/Orbits:  No acute findings. Other: None. IMPRESSION: No acute intracranial abnormality. Electronically Signed   By: Marlaine Hind M.D.   On: 05/29/2022 12:23    DISCHARGE EXAMINATION: Vitals:   06/18/22 0731 06/18/22 1201 06/18/22 2008 06/19/22 0824  BP: (!) 174/85 (!) 153/77 (!) 168/76 (!) 157/74  Pulse: 64 69 73 70  Resp: 18 17 14 18  $ Temp: 99.1 F (37.3 C) 98.1 F (36.7 C) 98.7 F (37.1 C) 98.2 F (36.8 C)  TempSrc: Oral Oral Oral   SpO2: 95% 96% 100% 93%  Weight:         General appearance: Awake alert.  In no distress Resp: Clear to auscultation bilaterally.  Normal effort Cardio: S1-S2 is normal regular.  No S3-S4.  No rubs murmurs or bruit GI: Abdomen is soft.  Nontender nondistended.  Bowel sounds are present normal.  No masses organomegaly Extremities: No edema.    DISPOSITION: SNF  Discharge Instructions     Ambulatory referral to Neurology   Complete by: As directed    An appointment is requested in approximately: 8 weeks   Call MD for:  difficulty breathing, headache or visual disturbances   Complete by: As directed    Call MD for:  extreme fatigue   Complete by: As directed    Call MD for:  persistant dizziness or light-headedness   Complete by: As directed    Call MD for:  persistant nausea and vomiting   Complete by: As directed    Call MD for:  severe uncontrolled pain   Complete by: As directed    Call MD for:  temperature >100.4   Complete by: As directed    Discharge instructions   Complete by: As directed    Please review instructions on the discharge summary.  You were cared for by a hospitalist during your hospital stay. If you have any questions about your discharge medications or the care you received while you were in the hospital after you are discharged, you can call the unit and asked to speak with the hospitalist on call if the hospitalist that took care of you is not available. Once you are discharged, your primary care physician will handle any further medical issues. Please note that NO REFILLS for any discharge medications will be authorized once you are discharged, as it is imperative that you return to your primary care physician (or establish a relationship with a primary care physician if you do not have one) for your aftercare needs so that they can reassess your need for medications and monitor your lab values. If you do not have a primary care physician, you can call [phone] for a physician referral.    Increase activity slowly   Complete by: As directed           Allergies as of 06/19/2022   No Known Allergies      Medication  List     STOP taking these medications    cilostazol 50 MG tablet Commonly known as: PLETAL   clopidogrel 75 MG tablet Commonly known as: PLAVIX   insulin glargine-yfgn 100 UNIT/ML injection Commonly known as: SEMGLEE   insulin lispro 100 UNIT/ML KwikPen Commonly known as: HUMALOG   lisinopril-hydrochlorothiazide 20-12.5 MG tablet Commonly known as: ZESTORETIC       TAKE these medications    amLODipine 10 MG tablet Commonly known as: NORVASC Take 1 tablet by mouth once daily   aspirin EC 81 MG tablet Take 1 tablet (81 mg total) by mouth daily. Swallow whole.   atorvastatin 80 MG tablet Commonly known as: Lipitor Take 1 tablet (80 mg total) by mouth daily.   Blood Pressure Monitor Automat Devi 1 Device by Does not apply route daily.   ezetimibe 10 MG tablet Commonly known as: ZETIA Take 1 tablet (10 mg total) by mouth daily.   FLUoxetine 20 MG capsule Commonly known as: PROZAC Take 1 capsule by mouth once daily   glucose blood test strip Commonly known as: OneTouch Verio USE TO CHECK BLOOD SUGAR TWICE A DAY AND PRN   glucose blood test strip Commonly known as: Contour Next Test 1 each by Other route 2 (two) times daily. And lancets 2/day   hydrALAZINE 50 MG tablet Commonly known as: APRESOLINE TAKE 1 TABLET BY MOUTH THREE TIMES DAILY   insulin aspart 100 UNIT/ML injection Commonly known as: novoLOG Inject 0-15 Units into the skin 3 (three) times daily with meals. CBG < 70: Implement Hypoglycemia Standing Orders and refer to Hypoglycemia Standing Orders sidebar report CBG 70 - 120: 0 units CBG 121 - 150: 2 units CBG 151 - 200: 3 units CBG 201 - 250: 5 units CBG 251 - 300: 8 units CBG 301 - 350: 11 units CBG 351 - 400: 15 units   lidocaine 5 % Commonly known as: LIDODERM Place 1 patch onto the skin daily. Remove &  Discard patch within 12 hours or as directed by MD   loperamide 2 MG capsule Commonly known as: IMODIUM Take 2 capsules (4 mg total) by mouth 3 (three) times daily as needed for diarrhea or loose stools.   ONE TOUCH LANCETS Misc USE TO CHECK BLOOD SUGAR TWICE A DAY AND PRN   pantoprazole 40 MG tablet Commonly known as: PROTONIX Take 1 tablet (40 mg total) by mouth daily.   saccharomyces boulardii 250 MG capsule Commonly known as: FLORASTOR Take 1 capsule (250 mg total) by mouth 2 (two) times daily.   ticagrelor 90 MG Tabs tablet Commonly known as: BRILINTA Take 1 tablet (90 mg total) by mouth 2 (two) times daily.   Trulicity [id number] Sopn Generic drug: Dulaglutide Inject 0.75 mg into the skin once a week.   Vitamin B Complex Tabs Take 1 tablet by mouth every morning.   VITAMIN D3 PO Take 1 capsule by mouth every morning.          Contact information for follow-up providers     [name], [name], MD. Schedule an appointment as soon as possible for a visit.   Specialty: Family Medicine Why: post hospitalization follow up Contact information: [place] [address] [phone]              Contact information for after-discharge care     Destination     [facility] Preferred SNF .   Service: Skilled Chiropodist information: [address] [place] TIME: 35 mins  [place] [facility] [employer] on [email or web address]  06/19/2022, 10:01 AM

## 2022-06-20 LAB — GLUCOSE, CAPILLARY
Glucose-Capillary: 185 mg/dL — ABNORMAL HIGH (ref 70–99)
Glucose-Capillary: 141 mg/dL — ABNORMAL HIGH (ref 70–99)
Glucose-Capillary: 235 mg/dL — ABNORMAL HIGH (ref 70–99)
Glucose-Capillary: 188 mg/dL — ABNORMAL HIGH (ref 70–99)

## 2022-06-20 NOTE — Progress Notes (Signed)
Occupational Therapy Treatment Patient Details Name: [name] MRN: [id number] DOB: 28-Apr-1961 Today's Date: 06/20/2022   History of present illness [name] is a 62 y.o. female found to have new onset of right-sided weakness and worsening of dysarthria and drooling at [facility] hospital inpatient rehab unit on 06/12/22.  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 Patient transferred to [facility] acute care.  PMH: multiple strokes in 2022, 2023 and 2024, HTN, HLD, IDDM, PVD, non-Hodgkin's lymphoma, sickle cell trait, CKD stage IIIa   OT comments  Patient seen by OT/PT to address bed mobility, sit to stands, and transfers. Patient was mod/max assist to get to EOB with patient attempting to assist with cues for rail use. Patient stood into stedy and required assistance for cleaning. Patient stood in stedy x3 with max assist of 2 to address cleaning following BM due to limited standing tolerance. Patient is motivated towards rehab and progress with transfers and self care. Patient would benefit from continued OT/PT services to increase independence and reduce burden of care for functional transfers, bathing and dressing. Acute OT to continue to follow.    Recommendations for follow up therapy are one component of a multi-disciplinary discharge planning process, led by the attending physician.  Recommendations may be updated based on patient status, additional functional criteria and insurance authorization.    Follow Up Recommendations  Acute inpatient rehab (3hours/day)     Assistance Recommended at Discharge Frequent or constant Supervision/Assistance  Patient can return home with the following  Two people to help with walking and/or transfers;A lot of help with bathing/dressing/bathroom;Assistance with cooking/housework;Direct supervision/assist for medications management;Assist for transportation;Help with stairs or ramp for entrance;Direct  supervision/assist for financial management   Equipment Recommendations  Other (comment) (defer to next venue)    Recommendations for Other Services      Precautions / Restrictions Precautions Precautions: Fall Precaution Comments: Left Hemiparesis, Left knee buckles, support left arm in standing Restrictions Weight Bearing Restrictions: No       Mobility Bed Mobility Overal bed mobility: Needs Assistance Bed Mobility: Supine to Sit     Supine to sit: Mod assist, Max assist, +2 for physical assistance     General bed mobility comments: mod assist for one and max assist for another with patient attempting to assist but limited due to weakness    Transfers Overall transfer level: Needs assistance   Transfers: Sit to/from Stand, Bed to chair/wheelchair/BSC Sit to Stand: Max assist, +2 physical assistance           General transfer comment: stood in stedy for transfer with max assist x2 to power up Transfer via Lift Equipment: Stedy   Balance Overall balance assessment: Needs assistance Sitting-balance support: Single extremity supported, Feet supported Sitting balance-Leahy Scale: Poor Sitting balance - Comments: mod assist initially due to pushing with RUE, progressed to min assist Postural control: Left lateral lean Standing balance support: Single extremity supported Standing balance-Leahy Scale: Poor Standing balance comment: reliant on Stedy and therapist to stand                           ADL either performed or assessed with clinical judgement   ADL Overall ADL's : Needs assistance/impaired             Lower Body Bathing: Total assistance;Sit to/from stand Lower Body Bathing Details (indicate cue type and reason): stood in stedy for cleaning following BM with 3 stands  to complete     Lower Body Dressing: Maximal assistance;Sitting/lateral leans Lower Body Dressing Details (indicate cue type and reason): to donn shoes                General ADL Comments: max assist x2 to stand and max assist to total assist for self care    Extremity/Trunk Assessment Upper Extremity Assessment RUE Deficits / Details: decreased strength, but functional RUE Sensation: WNL LUE Deficits / Details: Decreased strength in shoulder, elbow wrist and hand approximately 3-/5, also noted to have flexor tone when transitioning to seated position LUE Sensation: WNL LUE Coordination: decreased fine motor;decreased gross motor            Vision       Perception     Praxis      Cognition Arousal/Alertness: Awake/alert Behavior During Therapy: WFL for tasks assessed/performed Overall Cognitive Status: Impaired/Different from baseline Area of Impairment: Problem solving                             Problem Solving: Slow processing, Decreased initiation, Requires verbal cues, Requires tactile cues, Difficulty sequencing General Comments: pleasant and cooperative, increased time to follow directions        Exercises      Shoulder Instructions       General Comments      Pertinent Vitals/ Pain       Pain Assessment Pain Assessment: Faces Faces Pain Scale: No hurt Pain Intervention(s): Monitored during session  Home Living                                          Prior Functioning/Environment              Frequency  Min 2X/week        Progress Toward Goals  OT Goals(current goals can now be found in the care plan section)  Progress towards OT goals: Progressing toward goals  Acute Rehab OT Goals Patient Stated Goal: get better OT Goal Formulation: With patient Time For Goal Achievement: 06/27/22 Potential to Achieve Goals: Good ADL Goals Pt Will Perform Lower Body Bathing: sitting/lateral leans;with mod assist Pt Will Perform Lower Body Dressing: with mod assist;sitting/lateral leans Pt Will Transfer to Toilet: with mod assist;stand pivot transfer;bedside commode Pt Will  Perform Toileting - Clothing Manipulation and hygiene: with mod assist;sitting/lateral leans;sit to/from stand Additional ADL Goal #1: Patient will be able to sit EOB for 5 minutes while completing functional ADL task with min A only to correct posture.  Plan Discharge plan remains appropriate    Co-evaluation    PT/OT/SLP Co-Evaluation/Treatment: Yes Reason for Co-Treatment: For patient/therapist safety;To address functional/ADL transfers   OT goals addressed during session: ADL's and self-care      AM-PAC OT "6 Clicks" Daily Activity     Outcome Measure   Help from another person eating meals?: A Little Help from another person taking care of personal grooming?: A Little Help from another person toileting, which includes using toliet, bedpan, or urinal?: Total Help from another person bathing (including washing, rinsing, drying)?: A Lot Help from another person to put on and taking off regular upper body clothing?: A Lot Help from another person to put on and taking off regular lower body clothing?: A Lot 6 Click Score: 13    End of Session Equipment Utilized During  Treatment: Gait belt;Other (comment) Charlaine Dalton)  OT Visit Diagnosis: Unsteadiness on feet (R26.81);Other abnormalities of gait and mobility (R26.89);Muscle weakness (generalized) (M62.81)   Activity Tolerance Patient tolerated treatment well   Patient Left in chair;with call bell/phone within reach   Nurse Communication Mobility status;Need for lift equipment        Time: [id number] OT Time Calculation (min): 29 min  Charges: OT General Charges $OT Visit: 1 Visit OT Treatments $Therapeutic Activity: 8-22 mins  Lodema Hong, [place]  Office [place] 06/20/2022, 12:07 PM

## 2022-06-20 NOTE — Progress Notes (Signed)
Physical Therapy Treatment Patient Details Name: [name] MRN: [id number] DOB: 1960/09/04 Today's Date: 06/20/2022   History of Present Illness [name] is a 62 y.o. female found to have new onset of right-sided weakness and worsening of dysarthria and drooling at [facility] hospital inpatient rehab unit on 06/12/22.  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 Patient transferred to [facility] acute care.  PMH: multiple strokes in 2022, 2023 and 2024, HTN, HLD, IDDM, PVD, non-Hodgkin's lymphoma, sickle cell trait, CKD stage IIIa    PT Comments    Pt seen by PT/OT to maximize functional mobility. Worked on sit>stand from various height surfaces as well as maintaining midline standing and postural correction. Pt needing max A +2 for most mobility. Not able to wt shift and step feet in standing to progress ambulation yet. Pt remains very motivated to mobilize, continue to recommend return to [facility]. PT will continue to follow.    Recommendations for follow up therapy are one component of a multi-disciplinary discharge planning process, led by the attending physician.  Recommendations may be updated based on patient status, additional functional criteria and insurance authorization.  Follow Up Recommendations  Acute inpatient rehab (3hours/day)     Assistance Recommended at Discharge Frequent or constant Supervision/Assistance  Patient can return home with the following Two people to help with walking and/or transfers;Two people to help with bathing/dressing/bathroom;Assist for transportation;Help with stairs or ramp for entrance   Equipment Recommendations  Wheelchair (measurements PT);Wheelchair cushion (measurements PT);BSC/3in1;Hospital bed    Recommendations for Other Services Rehab consult     Precautions / Restrictions Precautions Precautions: Fall Precaution Comments: Left Hemiparesis, Left knee buckles, support left arm in  standing Restrictions Weight Bearing Restrictions: No     Mobility  Bed Mobility Overal bed mobility: Needs Assistance Bed Mobility: Supine to Sit     Supine to sit: Mod assist, Max assist, +2 for physical assistance     General bed mobility comments: mod assist on right side and max assist on left with patient attempting to assist but limited due to weakness and decreased coordination    Transfers Overall transfer level: Needs assistance Equipment used: Ambulation equipment used Transfers: Sit to/from Stand, Bed to chair/wheelchair/BSC Sit to Stand: Max assist, +2 physical assistance           General transfer comment: worked on sit>stand from bed and from flaps of stedy. Pt with knee L knee buckling with stand>sit even with L AFO/ shoe donned. LUE support needed for each transition. Pt initiating but needed max A +2 to achieve full standing and vc's for posture Transfer via Lift Equipment: Stedy  Ambulation/Gait               General Gait Details: unable to wt shift or step feet in standing   Stairs             Wheelchair Mobility    Modified Rankin (Stroke Patients Only) Modified Rankin (Stroke Patients Only) Pre-Morbid Rankin Score: Moderate disability Modified Rankin: Severe disability     Balance Overall balance assessment: Needs assistance Sitting-balance support: Single extremity supported, Feet supported Sitting balance-Leahy Scale: Poor Sitting balance - Comments: mod assist initially due to pushing with RUE, progressed to min assist Postural control: Left lateral lean Standing balance support: Single extremity supported Standing balance-Leahy Scale: Poor Standing balance comment: reliant on Stedy and therapist to stand               High Level  Balance Comments: worked on maintaining midline in standing            Cognition Arousal/Alertness: Awake/alert Behavior During Therapy: WFL for tasks assessed/performed Overall  Cognitive Status: Impaired/Different from baseline Area of Impairment: Problem solving                             Problem Solving: Slow processing, Decreased initiation, Requires verbal cues, Requires tactile cues, Difficulty sequencing General Comments: pleasant and cooperative, increased time to follow directions. Does have awareness into deficits        Exercises      General Comments General comments (skin integrity, edema, etc.): pt soiled once standing and still going. Clean up completed in stedy. VSS with mobility      Pertinent Vitals/Pain Pain Assessment Pain Assessment: Faces Faces Pain Scale: No hurt    Home Living                          Prior Function            PT Goals (current goals can now be found in the care plan section) Acute Rehab PT Goals Patient Stated Goal: to get better PT Goal Formulation: With patient Time For Goal Achievement: 06/27/22 Potential to Achieve Goals: Good Progress towards PT goals: Progressing toward goals    Frequency    Min 3X/week      PT Plan Current plan remains appropriate    Co-evaluation PT/OT/SLP Co-Evaluation/Treatment: Yes Reason for Co-Treatment: For patient/therapist safety;To address functional/ADL transfers PT goals addressed during session: Mobility/safety with mobility;Balance;Strengthening/ROM;Proper use of DME OT goals addressed during session: ADL's and self-care      AM-PAC PT "6 Clicks" Mobility   Outcome Measure  Help needed turning from your back to your side while in a flat bed without using bedrails?: A Lot Help needed moving from lying on your back to sitting on the side of a flat bed without using bedrails?: A Lot Help needed moving to and from a bed to a chair (including a wheelchair)?: Total Help needed standing up from a chair using your arms (e.g., wheelchair or bedside chair)?: Total Help needed to walk in hospital room?: Total Help needed climbing 3-5 steps  with a railing? : Total 6 Click Score: 8    End of Session Equipment Utilized During Treatment: Gait belt Activity Tolerance: Patient tolerated treatment well Patient left: with call bell/phone within reach;in chair Nurse Communication: Mobility status;Need for lift equipment PT Visit Diagnosis: Hemiplegia and hemiparesis;Difficulty in walking, not elsewhere classified (R26.2) Hemiplegia - Right/Left: Right Hemiplegia - dominant/non-dominant: Dominant Hemiplegia - caused by: Cerebral infarction     Time: [id number] PT Time Calculation (min) (ACUTE ONLY): 28 min  Charges:  $Therapeutic Activity: 8-22 mins                     Leighton Roach, PT  Acute Rehab Services Secure chat preferred Office [place] 06/20/2022, 1:46 PM

## 2022-06-20 NOTE — Discharge Summary (Signed)
[facility]  Physician Discharge Summary   Patient ID: [name] MRN: [id number] DOB/AGE: [birth year] 62 y.o.  Admit date: 06/12/2022 Discharge date:   06/20/2022   PCP: [name], [name], MD  DISCHARGE DIAGNOSES:    Acute CVA (cerebrovascular accident) [facility])   Dysarthria Aspiration pneumonitis Diarrhea, resolved Essential hypertension Insulin-dependent diabetes mellitus Peripheral artery disease Chronic diastolic CHF Microcytic anemia   RECOMMENDATIONS FOR OUTPATIENT FOLLOW UP: Please check CBC and basic metabolic panel in 3 to 4 days Ambulatory referral has been sent to neurology for outpatient follow-up See below regarding reinitiating antihypertensives.   Home Health: Needs SNF Equipment/Devices: None  CODE STATUS: Full code  DISCHARGE CONDITION: fair  Diet recommendation: Modified carbohydrate with thin liquids  INITIAL HISTORY: [name] is a 62 y.o. female with medical history significant of multiple strokes in 2022, 2023 and 2024, HTN, HLD, IDDM, PVD, non-Hodgkin's lymphoma, sickle cell trait, CKD stage IIIa, was found to have new onset of right-sided weakness and worsening of dysarthria and drooling at [facility] hospital inpatient rehab unit.   Patient admitted 2 weeks ago secondary to new onset of right medulla stroke,subsequently discharged to inpt rehab - L sided weakness/dysarthria noted at time of discharge. Staff at rehab on 06/12/22 noted seemingly new weakness of right arm and hand, when patient was unable to hold anything on her right hand as well as worsening of slurred speech as well as new left-sided drooling.  Neurology consulted, given that exact onset of her symptoms is unknown, decision made to not give TNK.  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 Admitted to hospitalist service with neurology consult.  Consultations: Neurology    HOSPITAL COURSE:   Acute extension of previous  stroke, with new onset of right-sided paresis, dysphagia and facial droop -MRI confirms "Increased size of a recent right medullary infarct which now extends to the left of midline.New punctate acute left cerebellar infarct." -Neurology following, recommending aspirin and Brilinta for 3 months followed by aspirin alone.  If there is difficulty in affording Brilinta then consider switching to aspirin and Plavix after 4 weeks. Initially there was concern about her ability to take medications orally but looks like her oral intake has improved. Initially there was plan for the patient to go back to CIR but does not appear that that is an option currently.  She needs to go to skilled nursing facility for rehab.   Probable aspiration pneumonitis Pneumonia less likely but cannot be ruled out Secondary to above presumed worsening dysphagia Patient was started on Unasyn.  However patient started experiencing diarrhea.  Since her respiratory status was otherwise stable Unasyn was discontinued.  Procalcitonin was unremarkable.  Respiratory status is stable.  She is saturating 100% on room air.   Acute diarrhea Most likely due to antibiotics.  Abdomen was benign examination.  She was given Imodium and probiotics.  Antibiotics were discontinued.   Diarrhea has resolved for the most part.  Occasional loose stools noted.  Continue Imodium as needed.  Continue probiotics.   Essential hypertension Initially allowed permissive hypertension.  Blood pressure to be gradually decreased.  Amlodipine was resumed.  Can also resume hydralazine at discharge.  Lisinopril can be reinitiated depending on blood pressures and blood work over the next 3 to 4 days.   IDDM, uncontrolled with hyperglycemia A1c 11.4 just last month. Monitor CBG's.  Continue just SSI for now.  Glargine can be reintroduced depending on glucose levels.     PAD  Will hold cilostazol while patient on DAPT of aspirin and Brilinta   CKD stage  IIIa Stable   Chronic HFpEF Stable for the most part.   Microcytic anemia No evidence of overt bleeding.  Outpatient management.    Obesity Estimated body mass index is 31.9 kg/m as calculated from the following:   Height as of 06/04/22: 5' 4"$  (1.626 m).   Weight as of this encounter: 84.3 kg.  Patient is stable.  Occasional loose stool noted.  Okay for discharge to SNF when bed is available.    PERTINENT LABS:  The results of significant diagnostics from this hospitalization (including imaging, microbiology, ancillary and laboratory) are listed below for reference.    Labs:   Basic Metabolic Panel: Recent Labs  Lab 06/16/22 0633 06/17/22 0702 06/19/22 1201  NA 137 137 135  K 3.8 3.8 3.7  CL 105 107 105  CO2 18* 20* 19*  GLUCOSE 162* 126* 172*  BUN 18 19 17  $ CREATININE 1.16* 1.20* 1.12*  CALCIUM 9.4 9.3 9.3    Liver Function Tests: Recent Labs  Lab 06/17/22 0702  AST 27  ALT 42  ALKPHOS 83  BILITOT 0.3  PROT 6.1*  ALBUMIN 2.2*     CBC: Recent Labs  Lab 06/16/22 0633 06/17/22 0702  WBC 14.3* 10.1  HGB 11.5* 10.7*  HCT 35.0* 33.1*  MCV 77.4* 77.5*  PLT 271 240      CBG: Recent Labs  Lab 06/19/22 0627 06/19/22 1120 06/19/22 1650 06/19/22 2118 06/20/22 0609  GLUCAP 141* 169* 171* 228* 185*      IMAGING STUDIES DG Chest 1 View  Result Date: 06/13/2022 CLINICAL DATA:  Aspiration pneumonia EXAM: CHEST  1 VIEW COMPARISON:  Radiograph 06/12/2022 FINDINGS: Unchanged cardiomediastinal silhouette. Bandlike atelectasis in the lower lungs. No new airspace disease. No pleural effusion or evidence of pneumothorax. Bones are unchanged. IMPRESSION: No new airspace disease. Electronically Signed   By: Maurine Simmering M.D.   On: 06/13/2022 07:58   DG Chest 1 View  Result Date: 06/12/2022 CLINICAL DATA:  Pneumonia EXAM: CHEST  1 VIEW COMPARISON:  Chest x-ray 05/29/2022 FINDINGS: Cardiomediastinal silhouette is within normal limits. There is a linear band  of atelectasis or scarring in the left mid lung similar to prior. Lungs are otherwise clear. No pleural effusion or pneumothorax. No acute fractures. IMPRESSION: No acute cardiopulmonary process.  No significant interval change. Electronically Signed   By: Ronney Asters M.D.   On: 06/12/2022 16:17   MR BRAIN WO CONTRAST  Result Date: 06/12/2022 CLINICAL DATA:  Neuro deficit, acute, stroke suspected. New right sided weakness, worsening aphasia. EXAM: MRI HEAD WITHOUT CONTRAST TECHNIQUE: Multiplanar, multiecho pulse sequences of the brain and surrounding structures were obtained without intravenous contrast. COMPARISON:  Head CT 06/11/2022 and MRI/MRA 05/30/2022 FINDINGS: Multiple sequences are up to moderately motion degraded. Brain: The acute infarct in the right ventral medulla on the prior MRI has enlarged and now extends to the left of midline. There is also a new punctate acute infarct in the left cerebellar hemisphere. Small T2 hyperintensities in the cerebral white matter bilaterally are unchanged and nonspecific but compatible with mild chronic small vessel ischemic disease chronic microhemorrhages are again noted in the right thalamus and right parietal white matter. Small chronic bilateral cerebellar infarcts are again noted with hemosiderin deposition associated with 1 of the left-sided infarcts. The ventricles and sulci are normal. No mass, midline shift, or extra-axial fluid collection is identified. Vascular: Unchanged abnormal appearance of the distal right  vertebral artery, more fully evaluated on the prior MRA. Skull and upper cervical spine: No suspicious marrow lesion. Sinuses/Orbits: Unremarkable orbits. Mucous retention cyst in the right maxillary sinus. Small left mastoid effusion. Other: Partially visualized bilateral cervical lymphadenopathy with history of lymphoma. IMPRESSION: 1. Increased size of a recent right medullary infarct which now extends to the left of midline. 2. New punctate  acute left cerebellar infarct. 3. Mild chronic small vessel ischemic disease. Chronic cerebellar infarcts. These results will be called to the ordering clinician or representative by the Radiologist Assistant, and communication documented in the PACS or [employer]. Electronically Signed   By: Logan Bores M.D.   On: 06/12/2022 13:24   CT HEAD WO CONTRAST (5MM)  Result Date: 06/11/2022 CLINICAL DATA:  Follow-up examination for stroke, slurred speech. EXAM: CT HEAD WITHOUT CONTRAST TECHNIQUE: Contiguous axial images were obtained from the base of the skull through the vertex without intravenous contrast. RADIATION DOSE REDUCTION: This exam was performed according to the departmental dose-optimization program which includes automated exposure control, adjustment of the mA and/or kV according to patient size and/or use of iterative reconstruction technique. COMPARISON:  Comparison made with prior CT and MRI from 05/30/2022. FINDINGS: Brain: Cerebral volume within normal limits for age. Mild chronic small vessel ischemic disease noted. Previously identified small medullary infarct not visible by CT. No other acute large vessel territory infarct. No intracranial hemorrhage. No mass lesion or midline shift. No hydrocephalus or extra-axial fluid collection. Vascular: No abnormal hyperdense vessel. Calcified atherosclerosis present about the skull base. Skull: Scalp soft tissues and calvarium within normal limits. Sinuses/Orbits: Globes and orbital soft tissues demonstrate no acute finding. Paranasal sinuses remain largely clear. No mastoid effusion. Other: None. IMPRESSION: 1. No acute intracranial abnormality. 2. Mild chronic small vessel ischemic disease. Previously identified small medullary infarct not visible by CT. Electronically Signed   By: Jeannine Boga M.D.   On: 06/11/2022 21:07   ECHOCARDIOGRAM COMPLETE  Result Date: 05/31/2022    ECHOCARDIOGRAM REPORT   Patient Name:   CHELISA DORROUGH Date  of Exam: 05/31/2022 Medical Rec #:  [id number]       Height:       64.0 in Accession #:    [id number]      Weight:       179.0 lb Date of Birth:  [birth year]       BSA:          1.866 m Patient Age:    40 years        BP:           168/93 mmHg Patient Gender: F               HR:           72 bpm. Exam Location:  Inpatient Procedure: 2D Echo, Cardiac Doppler and Color Doppler Indications:    Stroke  History:        Patient has prior history of Echocardiogram examinations.                 Stroke; Risk Factors:Hypertension, Diabetes and Dyslipidemia.  Sonographer:    Phineas Douglas Referring Phys: [phone] AMRIT ADHIKARI IMPRESSIONS  1. Left ventricular ejection fraction, by estimation, is 60 to 65%. The left ventricle has normal function. The left ventricle has no regional wall motion abnormalities. There is mild concentric left ventricular hypertrophy. Left ventricular diastolic parameters are consistent with Grade I diastolic dysfunction (impaired relaxation).  2. Right ventricular systolic function is normal. The right  ventricular size is normal. Tricuspid regurgitation signal is inadequate for assessing PA pressure.  3. The mitral valve is grossly normal. No evidence of mitral valve regurgitation. No evidence of mitral stenosis.  4. The aortic valve is tricuspid. Aortic valve regurgitation is not visualized. No aortic stenosis is present.  5. The inferior vena cava is normal in size with greater than 50% respiratory variability, suggesting right atrial pressure of 3 mmHg. Conclusion(s)/Recommendation(s): No intracardiac source of embolism detected on this transthoracic study. Consider a transesophageal echocardiogram to exclude cardiac source of embolism if clinically indicated. FINDINGS  Left Ventricle: Left ventricular ejection fraction, by estimation, is 60 to 65%. The left ventricle has normal function. The left ventricle has no regional wall motion abnormalities. The left ventricular internal cavity size was  normal in size. There is  mild concentric left ventricular hypertrophy. Left ventricular diastolic parameters are consistent with Grade I diastolic dysfunction (impaired relaxation). Right Ventricle: The right ventricular size is normal. No increase in right ventricular wall thickness. Right ventricular systolic function is normal. Tricuspid regurgitation signal is inadequate for assessing PA pressure. Left Atrium: Left atrial size was normal in size. Right Atrium: Right atrial size was normal in size. Pericardium: Trivial pericardial effusion is present. Mitral Valve: The mitral valve is grossly normal. No evidence of mitral valve regurgitation. No evidence of mitral valve stenosis. Tricuspid Valve: The tricuspid valve is grossly normal. Tricuspid valve regurgitation is trivial. No evidence of tricuspid stenosis. Aortic Valve: The aortic valve is tricuspid. Aortic valve regurgitation is not visualized. No aortic stenosis is present. Pulmonic Valve: The pulmonic valve was grossly normal. Pulmonic valve regurgitation is trivial. No evidence of pulmonic stenosis. Aorta: The aortic root and ascending aorta are structurally normal, with no evidence of dilitation. Venous: The inferior vena cava is normal in size with greater than 50% respiratory variability, suggesting right atrial pressure of 3 mmHg. IAS/Shunts: The atrial septum is grossly normal.  LEFT VENTRICLE PLAX 2D LVIDd:         3.60 cm      Diastology LVIDs:         2.30 cm      LV e' medial:    5.68 cm/s LV PW:         1.30 cm      LV E/e' medial:  12.4 LV IVS:        1.20 cm      LV e' lateral:   7.22 cm/s LVOT diam:     2.00 cm      LV E/e' lateral: 9.8 LV SV:         69 LV SV Index:   37 LVOT Area:     3.14 cm  LV Volumes (MOD) LV vol d, MOD A2C: 119.0 ml LV vol d, MOD A4C: 111.0 ml LV vol s, MOD A2C: 44.0 ml LV vol s, MOD A4C: 48.0 ml LV SV MOD A2C:     75.0 ml LV SV MOD A4C:     111.0 ml LV SV MOD BP:      68.6 ml RIGHT VENTRICLE             IVC RV Basal  diam:  3.10 cm     IVC diam: 1.40 cm RV S prime:     13.90 cm/s TAPSE (M-mode): 1.8 cm LEFT ATRIUM             Index        RIGHT ATRIUM  Index LA diam:        3.70 cm 1.98 cm/m   RA Area:     12.00 cm LA Vol (A2C):   51.4 ml 27.54 ml/m  RA Volume:   23.00 ml  12.32 ml/m LA Vol (A4C):   56.6 ml 30.33 ml/m LA Biplane Vol: 58.1 ml 31.13 ml/m  AORTIC VALVE LVOT Vmax:   93.20 cm/s LVOT Vmean:  62.800 cm/s LVOT VTI:    0.220 m  AORTA Ao Root diam: 3.10 cm Ao Asc diam:  3.20 cm MITRAL VALVE MV Area (PHT): 2.77 cm    SHUNTS MV Decel Time: 274 msec    Systemic VTI:  0.22 m MV E velocity: 70.70 cm/s  Systemic Diam: 2.00 cm MV A velocity: 95.50 cm/s MV E/A ratio:  0.74 Eleonore Chiquito MD Electronically signed by Eleonore Chiquito MD Signature Date/Time: 05/31/2022/9:44:38 AM    Final    CT HEAD WO CONTRAST (5MM)  Result Date: 05/30/2022 CLINICAL DATA:  Follow-up stroke EXAM: CT HEAD WITHOUT CONTRAST TECHNIQUE: Contiguous axial images were obtained from the base of the skull through the vertex without intravenous contrast. RADIATION DOSE REDUCTION: This exam was performed according to the departmental dose-optimization program which includes automated exposure control, adjustment of the mA and/or kV according to patient size and/or use of iterative reconstruction technique. COMPARISON:  MRI brain dated 05/30/2022 at 1031 hours FINDINGS: Brain: No evidence of acute infarction, hemorrhage, hydrocephalus, extra-axial collection or mass lesion/mass effect. Known tiny acute infarct within the right medulla is not evident on current CT. Mild subcortical white matter and periventricular small vessel ischemic changes. Vascular: Intracranial atherosclerosis. Skull: Normal. Negative for fracture or focal lesion. Sinuses/Orbits: The visualized paranasal sinuses are essentially clear. The mastoid air cells are unopacified. Other: None. IMPRESSION: Known tiny acute infarct within the right medulla is not evident on current CT.  Mild small vessel ischemic changes. Electronically Signed   By: Julian Hy M.D.   On: 05/30/2022 23:55   MR ANGIO HEAD WO CONTRAST  Result Date: 05/30/2022 CLINICAL DATA:  History of non-Hodgkin's lymphoma, hypertension, Guillain-Barre syndrome, and multiple strokes. Complains of dizziness and overall weakness. Possible CVA. EXAM: MRI HEAD WITHOUT CONTRAST MRA HEAD WITHOUT CONTRAST MRA NECK WITHOUT AND WITH CONTRAST TECHNIQUE: Multiplanar, multi-echo pulse sequences of the brain and surrounding structures were acquired without intravenous contrast. Angiographic images of the Circle of Willis were acquired using MRA technique without intravenous contrast. Angiographic images of the neck were acquired using MRA technique without and with intravenous contrast. Carotid stenosis measurements (when applicable) are obtained utilizing NASCET criteria, using the distal internal carotid diameter as the denominator. CONTRAST:  59m GADAVIST GADOBUTROL 1 MMOL/ML IV SOLN COMPARISON:  None Available. CT head 1 day prior, CTA head/neck 09/15/2021 FINDINGS: MRI HEAD FINDINGS Brain: There is diffusion restriction in the right aspect of the medulla with faint associated FLAIR signal abnormality consistent with acute infarct (5-9). There is no associated hemorrhage or mass effect. There is no other evidence of acute infarct. There is no acute intracranial hemorrhage or extra-axial fluid collection. Parenchymal volume is normal. The ventricles are normal in size. Scattered foci of FLAIR signal abnormality in the supratentorial white matter are nonspecific but likely reflects sequela of underlying chronic small-vessel ischemic change, similar to the MRI from 2023. There are small remote infarcts in the bilateral cerebellar hemispheres, at least 1 of which in the left cerebellar hemisphere is new since 09/15/2021. There are punctate chronic microhemorrhages in the right thalamus and right parietal white  matter, nonspecific but  possibly hypertensive. The pituitary and suprasellar region are normal. There is no mass lesion. There is no mass effect or midline shift. Vascular: See below. Skull and upper cervical spine: Normal marrow signal. Sinuses/Orbits: The paranasal sinuses are clear. The globes and orbits are unremarkable. Other: There are prominent lymph nodes in the imaged upper neck on the coronal diffusion sequence which appear overall decreased in size since the prior MRI from 2023 though are incompletely evaluated. MRA HEAD FINDINGS Anterior circulation: There is atherosclerotic irregularity of the carotid siphons resulting in moderate stenosis of the cavernous segment on the left and mild-to-moderate stenosis of the supraclinoid segment on the right. The left M1 segment is patent. There is moderate to severe stenosis of the origin of the superior left M2 division (7-156). The distal left MCA branches appear patent. There is severe stenosis of the distal right M1 segment (1036-3). There is multifocal moderate narrowing of the proximal right superior M2 division. The distal right MCA branches otherwise appear patent. The bilateral ACAs are patent, without proximal stenosis or occlusion. The anterior communicating artery is not definitely identified. There is no aneurysm or AVM. Posterior circulation: There is no flow related enhancement of the right V4 segment on the time-of-flight MRA head. There is intermittent enhancement on the postcontrast MRA neck images. Findings are similar to the CTA from 2023. The PICA origin is not seen on the right. There is multifocal severe stenosis/near occlusion of the left V4 segment, also similar to the prior CT. PICA is identified on the left. The basilar artery is patent with atherosclerotic irregularity and narrowing. There is severe stenosis of the origin of the left P1 segment. There is multifocal atherosclerotic irregularity and narrowing in the remainder of the left PCA without other  high-grade stenosis or occlusion. The right PCA is patent with mild atherosclerotic irregularity but no high-grade stenosis or occlusion. Findings are similar to the prior CTA. There is no aneurysm or AVM. Anatomic variants: None. MRA NECK FINDINGS Aortic arch: The imaged aortic arch is normal. The origins of the major branch vessels are patent. The subclavian arteries are patent to the level imaged. 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 Right carotid system: The right common, internal, and external carotid arteries are patent, with mild irregularity at the bifurcation but no hemodynamically significant stenosis or occlusion. There is no evidence of dissection or aneurysm. Left carotid system: The left common, internal, and external carotid arteries are patent, without significant stenosis or occlusion. There is no evidence of dissection or aneurysm. Vertebral arteries: The right vertebral artery appears patent at its origin. There is short-segment high-grade stenosis/occlusion of the distal V2 segment (15-60). There is diminutive reconstitution with further occlusion of the V3 segment. There is moderate stenosis of the left vertebral artery origin (15-52). The left vertebral artery is otherwise patent in the neck without other hemodynamically significant stenosis or occlusion. There is no evidence of dissection or aneurysm. There is antegrade flow in both vertebral arteries. Other: None IMPRESSION: MR HEAD 1. Small acute infarct in the right aspect of the medulla without hemorrhage or mass effect. 2. Background chronic small-vessel ischemic change with small remote infarcts in the bilateral cerebellar hemispheres, one of which on the left is new since 09/15/2021. 3. Prominent lymph nodes in the upper neck consistent with the history of lymphoma, overall decreased in size since the prior studies from 09/15/2021 but incompletely evaluated. MRA HEAD 1. Diminutive enhancement of the right V4  segment with intermittent occlusion, and multifocal severe stenosis/near occlusion of the left V4 segment, similar to the prior CTA. 2. Additional intracranial atherosclerotic disease as detailed above resulting in moderate stenosis of the left cavernous ICA, mild-to-moderate stenosis of the right supraclinoid ICA, severe stenosis of the origin of the left superior M2 division, severe stenosis of the distal right M1 segment, and severe stenosis of the proximal left P1 segment. MRA NECK 1. 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 2. Moderate stenosis of the origin of the left vertebral artery, not significantly changed. 3. Patent carotid systems bilaterally. Electronically Signed   By: Valetta Mole M.D.   On: 05/30/2022 12:01   MR BRAIN WO CONTRAST  Result Date: 05/30/2022 CLINICAL DATA:  History of non-Hodgkin's lymphoma, hypertension, Guillain-Barre syndrome, and multiple strokes. Complains of dizziness and overall weakness. Possible CVA. EXAM: MRI HEAD WITHOUT CONTRAST MRA HEAD WITHOUT CONTRAST MRA NECK WITHOUT AND WITH CONTRAST TECHNIQUE: Multiplanar, multi-echo pulse sequences of the brain and surrounding structures were acquired without intravenous contrast. Angiographic images of the Circle of Willis were acquired using MRA technique without intravenous contrast. Angiographic images of the neck were acquired using MRA technique without and with intravenous contrast. Carotid stenosis measurements (when applicable) are obtained utilizing NASCET criteria, using the distal internal carotid diameter as the denominator. CONTRAST:  68m GADAVIST GADOBUTROL 1 MMOL/ML IV SOLN COMPARISON:  None Available. CT head 1 day prior, CTA head/neck 09/15/2021 FINDINGS: MRI HEAD FINDINGS Brain: There is diffusion restriction in the right aspect of the medulla with faint associated FLAIR signal abnormality consistent with acute infarct (5-9). There is no associated hemorrhage or mass  effect. There is no other evidence of acute infarct. There is no acute intracranial hemorrhage or extra-axial fluid collection. Parenchymal volume is normal. The ventricles are normal in size. Scattered foci of FLAIR signal abnormality in the supratentorial white matter are nonspecific but likely reflects sequela of underlying chronic small-vessel ischemic change, similar to the MRI from 2023. There are small remote infarcts in the bilateral cerebellar hemispheres, at least 1 of which in the left cerebellar hemisphere is new since 09/15/2021. There are punctate chronic microhemorrhages in the right thalamus and right parietal white matter, nonspecific but possibly hypertensive. The pituitary and suprasellar region are normal. There is no mass lesion. There is no mass effect or midline shift. Vascular: See below. Skull and upper cervical spine: Normal marrow signal. Sinuses/Orbits: The paranasal sinuses are clear. The globes and orbits are unremarkable. Other: There are prominent lymph nodes in the imaged upper neck on the coronal diffusion sequence which appear overall decreased in size since the prior MRI from 2023 though are incompletely evaluated. MRA HEAD FINDINGS Anterior circulation: There is atherosclerotic irregularity of the carotid siphons resulting in moderate stenosis of the cavernous segment on the left and mild-to-moderate stenosis of the supraclinoid segment on the right. The left M1 segment is patent. There is moderate to severe stenosis of the origin of the superior left M2 division (7-156). The distal left MCA branches appear patent. There is severe stenosis of the distal right M1 segment (1036-3). There is multifocal moderate narrowing of the proximal right superior M2 division. The distal right MCA branches otherwise appear patent. The bilateral ACAs are patent, without proximal stenosis or occlusion. The anterior communicating artery is not definitely identified. There is no aneurysm or AVM.  Posterior circulation: There is no flow related enhancement of the right V4 segment on the time-of-flight MRA head.  There is intermittent enhancement on the postcontrast MRA neck images. Findings are similar to the CTA from 2023. The PICA origin is not seen on the right. There is multifocal severe stenosis/near occlusion of the left V4 segment, also similar to the prior CT. PICA is identified on the left. The basilar artery is patent with atherosclerotic irregularity and narrowing. There is severe stenosis of the origin of the left P1 segment. There is multifocal atherosclerotic irregularity and narrowing in the remainder of the left PCA without other high-grade stenosis or occlusion. The right PCA is patent with mild atherosclerotic irregularity but no high-grade stenosis or occlusion. Findings are similar to the prior CTA. There is no aneurysm or AVM. Anatomic variants: None. MRA NECK FINDINGS Aortic arch: The imaged aortic arch is normal. The origins of the major branch vessels are patent. The subclavian arteries are patent to the level imaged. 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 Right carotid system: The right common, internal, and external carotid arteries are patent, with mild irregularity at the bifurcation but no hemodynamically significant stenosis or occlusion. There is no evidence of dissection or aneurysm. Left carotid system: The left common, internal, and external carotid arteries are patent, without significant stenosis or occlusion. There is no evidence of dissection or aneurysm. Vertebral arteries: The right vertebral artery appears patent at its origin. There is short-segment high-grade stenosis/occlusion of the distal V2 segment (15-60). There is diminutive reconstitution with further occlusion of the V3 segment. There is moderate stenosis of the left vertebral artery origin (15-52). The left vertebral artery is otherwise patent in the neck without other hemodynamically  significant stenosis or occlusion. There is no evidence of dissection or aneurysm. There is antegrade flow in both vertebral arteries. Other: None IMPRESSION: MR HEAD 1. Small acute infarct in the right aspect of the medulla without hemorrhage or mass effect. 2. Background chronic small-vessel ischemic change with small remote infarcts in the bilateral cerebellar hemispheres, one of which on the left is new since 09/15/2021. 3. Prominent lymph nodes in the upper neck consistent with the history of lymphoma, overall decreased in size since the prior studies from 09/15/2021 but incompletely evaluated. MRA HEAD 1. Diminutive enhancement of the right V4 segment with intermittent occlusion, and multifocal severe stenosis/near occlusion of the left V4 segment, similar to the prior CTA. 2. Additional intracranial atherosclerotic disease as detailed above resulting in moderate stenosis of the left cavernous ICA, mild-to-moderate stenosis of the right supraclinoid ICA, severe stenosis of the origin of the left superior M2 division, severe stenosis of the distal right M1 segment, and severe stenosis of the proximal left P1 segment. MRA NECK 1. 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 2. Moderate stenosis of the origin of the left vertebral artery, not significantly changed. 3. Patent carotid systems bilaterally. Electronically Signed   By: Valetta Mole M.D.   On: 05/30/2022 12:01   MR ANGIO NECK W WO CONTRAST  Result Date: 05/30/2022 CLINICAL DATA:  History of non-Hodgkin's lymphoma, hypertension, Guillain-Barre syndrome, and multiple strokes. Complains of dizziness and overall weakness. Possible CVA. EXAM: MRI HEAD WITHOUT CONTRAST MRA HEAD WITHOUT CONTRAST MRA NECK WITHOUT AND WITH CONTRAST TECHNIQUE: Multiplanar, multi-echo pulse sequences of the brain and surrounding structures were acquired without intravenous contrast. Angiographic images of the Circle of Willis were  acquired using MRA technique without intravenous contrast. Angiographic images of the neck were acquired using MRA technique without and with intravenous contrast. Carotid stenosis  measurements (when applicable) are obtained utilizing NASCET criteria, using the distal internal carotid diameter as the denominator. CONTRAST:  55m GADAVIST GADOBUTROL 1 MMOL/ML IV SOLN COMPARISON:  None Available. CT head 1 day prior, CTA head/neck 09/15/2021 FINDINGS: MRI HEAD FINDINGS Brain: There is diffusion restriction in the right aspect of the medulla with faint associated FLAIR signal abnormality consistent with acute infarct (5-9). There is no associated hemorrhage or mass effect. There is no other evidence of acute infarct. There is no acute intracranial hemorrhage or extra-axial fluid collection. Parenchymal volume is normal. The ventricles are normal in size. Scattered foci of FLAIR signal abnormality in the supratentorial white matter are nonspecific but likely reflects sequela of underlying chronic small-vessel ischemic change, similar to the MRI from 2023. There are small remote infarcts in the bilateral cerebellar hemispheres, at least 1 of which in the left cerebellar hemisphere is new since 09/15/2021. There are punctate chronic microhemorrhages in the right thalamus and right parietal white matter, nonspecific but possibly hypertensive. The pituitary and suprasellar region are normal. There is no mass lesion. There is no mass effect or midline shift. Vascular: See below. Skull and upper cervical spine: Normal marrow signal. Sinuses/Orbits: The paranasal sinuses are clear. The globes and orbits are unremarkable. Other: There are prominent lymph nodes in the imaged upper neck on the coronal diffusion sequence which appear overall decreased in size since the prior MRI from 2023 though are incompletely evaluated. MRA HEAD FINDINGS Anterior circulation: There is atherosclerotic irregularity of the carotid siphons resulting  in moderate stenosis of the cavernous segment on the left and mild-to-moderate stenosis of the supraclinoid segment on the right. The left M1 segment is patent. There is moderate to severe stenosis of the origin of the superior left M2 division (7-156). The distal left MCA branches appear patent. There is severe stenosis of the distal right M1 segment (1036-3). There is multifocal moderate narrowing of the proximal right superior M2 division. The distal right MCA branches otherwise appear patent. The bilateral ACAs are patent, without proximal stenosis or occlusion. The anterior communicating artery is not definitely identified. There is no aneurysm or AVM. Posterior circulation: There is no flow related enhancement of the right V4 segment on the time-of-flight MRA head. There is intermittent enhancement on the postcontrast MRA neck images. Findings are similar to the CTA from 2023. The PICA origin is not seen on the right. There is multifocal severe stenosis/near occlusion of the left V4 segment, also similar to the prior CT. PICA is identified on the left. The basilar artery is patent with atherosclerotic irregularity and narrowing. There is severe stenosis of the origin of the left P1 segment. There is multifocal atherosclerotic irregularity and narrowing in the remainder of the left PCA without other high-grade stenosis or occlusion. The right PCA is patent with mild atherosclerotic irregularity but no high-grade stenosis or occlusion. Findings are similar to the prior CTA. There is no aneurysm or AVM. Anatomic variants: None. MRA NECK FINDINGS Aortic arch: The imaged aortic arch is normal. The origins of the major branch vessels are patent. The subclavian arteries are patent to the level imaged. 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 Right carotid system: The right common, internal, and external carotid arteries are patent, with mild irregularity at the bifurcation but no  hemodynamically significant stenosis or occlusion. There is no evidence of dissection or aneurysm. Left carotid system: The left common, internal, and external carotid arteries are patent, without significant stenosis or occlusion. There  is no evidence of dissection or aneurysm. Vertebral arteries: The right vertebral artery appears patent at its origin. There is short-segment high-grade stenosis/occlusion of the distal V2 segment (15-60). There is diminutive reconstitution with further occlusion of the V3 segment. There is moderate stenosis of the left vertebral artery origin (15-52). The left vertebral artery is otherwise patent in the neck without other hemodynamically significant stenosis or occlusion. There is no evidence of dissection or aneurysm. There is antegrade flow in both vertebral arteries. Other: None IMPRESSION: MR HEAD 1. Small acute infarct in the right aspect of the medulla without hemorrhage or mass effect. 2. Background chronic small-vessel ischemic change with small remote infarcts in the bilateral cerebellar hemispheres, one of which on the left is new since 09/15/2021. 3. Prominent lymph nodes in the upper neck consistent with the history of lymphoma, overall decreased in size since the prior studies from 09/15/2021 but incompletely evaluated. MRA HEAD 1. Diminutive enhancement of the right V4 segment with intermittent occlusion, and multifocal severe stenosis/near occlusion of the left V4 segment, similar to the prior CTA. 2. Additional intracranial atherosclerotic disease as detailed above resulting in moderate stenosis of the left cavernous ICA, mild-to-moderate stenosis of the right supraclinoid ICA, severe stenosis of the origin of the left superior M2 division, severe stenosis of the distal right M1 segment, and severe stenosis of the proximal left P1 segment. MRA NECK 1. 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 2. Moderate stenosis of the  origin of the left vertebral artery, not significantly changed. 3. Patent carotid systems bilaterally. Electronically Signed   By: Valetta Mole M.D.   On: 05/30/2022 12:01   CT Angio Chest/Abd/Pel for Dissection W and/or W/WO  Result Date: 05/29/2022 CLINICAL DATA:  numbness, tingling ,dizziness, hypertensive Radiologic records indicates history of CLL. EXAM: CT ANGIOGRAPHY CHEST, ABDOMEN AND PELVIS TECHNIQUE: Non-contrast CT of the chest was initially obtained. Multidetector CT imaging through the chest, abdomen and pelvis was performed using the standard protocol during bolus administration of intravenous contrast. Multiplanar reconstructed images and MIPs were obtained and reviewed to evaluate the vascular anatomy. RADIATION DOSE REDUCTION: This exam was performed according to the departmental dose-optimization program which includes automated exposure control, adjustment of the mA and/or kV according to patient size and/or use of iterative reconstruction technique. CONTRAST:  52m OMNIPAQUE IOHEXOL 350 MG/ML SOLN COMPARISON:  Chest radiograph earlier today. Chest, abdomen, pelvis CT 10/15/2019 FINDINGS: CTA CHEST FINDINGS Cardiovascular: No aortic hematoma on this unenhanced exam. Thoracic aorta is normal in caliber with mild tortuosity. Mild aortic atherosclerosis. No dissection, aneurysm or acute aortic findings. Aberrant right subclavian artery. No central pulmonary embolus on this exam not tailored to pulmonary artery assessment. Borderline cardiomegaly. Minimal pericardial effusion. Mediastinum/Nodes: No enlarged mediastinal or hilar lymph nodes. There is bulky bilateral axillary adenopathy. Index right axillary node measures 2.1 cm short axis series 4, image 21. Left axillary node measures 2.5 cm short axis series 4, image 33. 11 mm left supraclavicular node is stable. Similar appearance of heterogeneous right lobe of the thyroid. No esophageal wall thickening. Lungs/Pleura: Minor atelectasis in the  lingula. No other focal airspace disease. No pleural fluid. No features of pulmonary edema. No pulmonary mass or nodule. The trachea and central airways are patent. Musculoskeletal: Heterogeneous appearance of the osseous structures but no discrete focal lesion. No acute osseous findings. Review of the MIP images confirms the above findings. CTA ABDOMEN AND PELVIS FINDINGS VASCULAR Aorta: Moderate atherosclerosis. Normal in caliber  without aneurysm, dissection, vasculitis or significant stenosis. Celiac: Mild plaque at the origin with minimal stenosis. Branch vessels are patent. No dissection or acute findings. SMA: Patent without evidence of aneurysm, dissection, vasculitis or significant stenosis. Renals: Single bilateral renal arteries. Mild calcified plaque at the origin of the both renal arteries with minimal stenosis. No dissection or acute findings. IMA: Patent without evidence of aneurysm, dissection, vasculitis or significant stenosis. Inflow: Moderate atherosclerosis. No aneurysm, dissection or acute findings. Veins: No obvious venous abnormality within the limitations of this arterial phase study. Review of the MIP images confirms the above findings. NON-VASCULAR Hepatobiliary: No focal hepatic abnormality on this arterial phase exam. Or gallbladder Pancreas: No ductal dilatation or inflammation. Spleen: Normal in size and arterial enhancement. Adrenals/Urinary Tract: No adrenal nodule. Heterogeneous enhancement of both kidneys with mild perinephric edema. Loss of corticomedullary differentiation on the right. No frank hydronephrosis. 4.3 cm mildly complex cyst arising from the lower pole of the left kidney, suboptimally assessed on this arterial phase exam. Thin internal septations are seen. Mild bladder distension but no wall thickening. Stomach/Bowel: No bowel obstruction or inflammation. Scattered colonic diverticula without diverticulitis. Normal appendix. Lymphatic: Retroperitoneal adenopathy with  progression from prior exam. Dominant node is at the right common iliac station measuring 2.9 cm short axis series 4, image 143. This node previously measured 2.5 cm. Lymph node at the level of the upper right kidney measures 18 mm short axis series 4, image 88. Anterior periaortic node measures 2.1 cm series 4, image 103. There also enlarged peripancreatic and periportal nodes. Mesenteric adenopathy is suboptimally assessed on this arterial phase exam, but mesenteric lymph nodes have increased in size from prior. There also ileocolic nodes are enlarged. Bilateral pelvic adenopathy, 17 mm left external iliac node series 4, image 167, 14 mm right external iliac node series 4, image 166. Reproductive: Status post hysterectomy. No adnexal masses. Other: No ascites or free air. Small fat containing umbilical hernia. Musculoskeletal: The bones are diffusely heterogeneous. No acute osseous finding. No discrete osseous lesion. Bilateral hip osteoarthritis. Review of the MIP images confirms the above findings. IMPRESSION: 1. No aortic dissection or acute aortic abnormality. Moderate aortic atherosclerosis. 2. Bulky bilateral axillary, mesenteric, and retroperitoneal adenopathy, consistent with history of CLL. Adenopathy is progressed from 2021 exam. 3. Heterogeneous enhancement of both kidneys with mild perinephric edema, suspicious for pyelonephritis. Recommend correlation with urinalysis. 4. Colonic diverticulosis without diverticulitis. Aortic Atherosclerosis (ICD10-I70.0). Electronically Signed   By: Keith Rake M.D.   On: 05/29/2022 15:17   DG Chest 2 View  Result Date: 05/29/2022 CLINICAL DATA:  Dizziness. EXAM: CHEST - 2 VIEW COMPARISON:  Chest CT dated 10/15/2019 FINDINGS: Stable enlarged heart and tortuous and calcified thoracic aorta. Mild linear scarring in the lingula without significant change. Otherwise, clear lungs with normal vascularity. Mild thoracic spine degenerative changes. IMPRESSION: No  acute abnormality. Stable cardiomegaly. Electronically Signed   By: Claudie Revering M.D.   On: 05/29/2022 12:29   CT Head Wo Contrast  Result Date: 05/29/2022 CLINICAL DATA:  Dizziness.  Numbness.  TIAs. EXAM: CT HEAD WITHOUT CONTRAST TECHNIQUE: Contiguous axial images were obtained from the base of the skull through the vertex without intravenous contrast. RADIATION DOSE REDUCTION: This exam was performed according to the departmental dose-optimization program which includes automated exposure control, adjustment of the mA and/or kV according to patient size and/or use of iterative reconstruction technique. COMPARISON:  09/15/2021 FINDINGS: Brain: No evidence of intracranial hemorrhage, acute infarction, hydrocephalus, extra-axial collection, or mass lesion/mass effect.  Old lacunar infarct noted in left superior cerebellum. Vascular:  No hyperdense vessel or other acute findings. Skull: No evidence of fracture or other significant bone abnormality. Sinuses/Orbits:  No acute findings. Other: None. IMPRESSION: No acute intracranial abnormality. Electronically Signed   By: Marlaine Hind M.D.   On: 05/29/2022 12:23    DISCHARGE EXAMINATION: Vitals:   06/19/22 1121 06/19/22 1951 06/19/22 2300 06/20/22 0717  BP: (!) 166/77 (!) 176/104 (!) 146/88 (!) 169/98  Pulse: 86 76 70 72  Resp: 18 20 17 20  $ Temp: 98.2 F (36.8 C) 98.2 F (36.8 C) 98.1 F (36.7 C) 98.8 F (37.1 C)  TempSrc:  Oral Oral Oral  SpO2: 99% 100% 100% 100%  Weight:        General appearance: Awake alert.  In no distress Resp: Clear to auscultation bilaterally.  Normal effort Cardio: S1-S2 is normal regular.  No S3-S4.  No rubs murmurs or bruit GI: Abdomen is soft.  Nontender nondistended.  Bowel sounds are present normal.  No masses organomegaly    DISPOSITION: SNF  Discharge Instructions     Ambulatory referral to Neurology   Complete by: As directed    An appointment is requested in approximately: 8 weeks   Call MD for:   difficulty breathing, headache or visual disturbances   Complete by: As directed    Call MD for:  extreme fatigue   Complete by: As directed    Call MD for:  persistant dizziness or light-headedness   Complete by: As directed    Call MD for:  persistant nausea and vomiting   Complete by: As directed    Call MD for:  severe uncontrolled pain   Complete by: As directed    Call MD for:  temperature >100.4   Complete by: As directed    Discharge instructions   Complete by: As directed    Please review instructions on the discharge summary.  You were cared for by a hospitalist during your hospital stay. If you have any questions about your discharge medications or the care you received while you were in the hospital after you are discharged, you can call the unit and asked to speak with the hospitalist on call if the hospitalist that took care of you is not available. Once you are discharged, your primary care physician will handle any further medical issues. Please note that NO REFILLS for any discharge medications will be authorized once you are discharged, as it is imperative that you return to your primary care physician (or establish a relationship with a primary care physician if you do not have one) for your aftercare needs so that they can reassess your need for medications and monitor your lab values. If you do not have a primary care physician, you can call [phone] for a physician referral.   Increase activity slowly   Complete by: As directed           Allergies as of 06/20/2022   No Known Allergies      Medication List     STOP taking these medications    cilostazol 50 MG tablet Commonly known as: PLETAL   clopidogrel 75 MG tablet Commonly known as: PLAVIX   insulin glargine-yfgn 100 UNIT/ML injection Commonly known as: SEMGLEE   insulin lispro 100 UNIT/ML KwikPen Commonly known as: HUMALOG   lisinopril-hydrochlorothiazide 20-12.5 MG tablet Commonly known as:  ZESTORETIC       TAKE these medications    amLODipine 10 MG tablet Commonly known  as: NORVASC Take 1 tablet by mouth once daily   aspirin EC 81 MG tablet Take 1 tablet (81 mg total) by mouth daily. Swallow whole.   atorvastatin 80 MG tablet Commonly known as: Lipitor Take 1 tablet (80 mg total) by mouth daily.   Blood Pressure Monitor Automat Devi 1 Device by Does not apply route daily.   ezetimibe 10 MG tablet Commonly known as: ZETIA Take 1 tablet (10 mg total) by mouth daily.   FLUoxetine 20 MG capsule Commonly known as: PROZAC Take 1 capsule by mouth once daily   glucose blood test strip Commonly known as: OneTouch Verio USE TO CHECK BLOOD SUGAR TWICE A DAY AND PRN   glucose blood test strip Commonly known as: Contour Next Test 1 each by Other route 2 (two) times daily. And lancets 2/day   hydrALAZINE 50 MG tablet Commonly known as: APRESOLINE TAKE 1 TABLET BY MOUTH THREE TIMES DAILY   insulin aspart 100 UNIT/ML injection Commonly known as: novoLOG Inject 0-15 Units into the skin 3 (three) times daily with meals. CBG < 70: Implement Hypoglycemia Standing Orders and refer to Hypoglycemia Standing Orders sidebar report CBG 70 - 120: 0 units CBG 121 - 150: 2 units CBG 151 - 200: 3 units CBG 201 - 250: 5 units CBG 251 - 300: 8 units CBG 301 - 350: 11 units CBG 351 - 400: 15 units   lidocaine 5 % Commonly known as: LIDODERM Place 1 patch onto the skin daily. Remove & Discard patch within 12 hours or as directed by MD   loperamide 2 MG capsule Commonly known as: IMODIUM Take 2 capsules (4 mg total) by mouth 3 (three) times daily as needed for diarrhea or loose stools.   ONE TOUCH LANCETS Misc USE TO CHECK BLOOD SUGAR TWICE A DAY AND PRN   pantoprazole 40 MG tablet Commonly known as: PROTONIX Take 1 tablet (40 mg total) by mouth daily.   saccharomyces boulardii 250 MG capsule Commonly known as: FLORASTOR Take 1 capsule (250 mg total) by mouth 2 (two) times  daily.   ticagrelor 90 MG Tabs tablet Commonly known as: BRILINTA Take 1 tablet (90 mg total) by mouth 2 (two) times daily.   Trulicity [id number] Sopn Generic drug: Dulaglutide Inject 0.75 mg into the skin once a week.   Vitamin B Complex Tabs Take 1 tablet by mouth every morning.   VITAMIN D3 PO Take 1 capsule by mouth every morning.          Contact information for follow-up providers     [name], [name], MD. Schedule an appointment as soon as possible for a visit.   Specialty: Family Medicine Why: post hospitalization follow up Contact information: [place] [address] [phone]              Contact information for after-discharge care     Destination     [facility] Preferred SNF .   Service: Skilled Chiropodist information: [place] [phone]                     TOTAL DISCHARGE TIME: 35 mins  [facility] Pager on [email or web address]  06/20/2022, 9:21 AM

## 2022-06-20 NOTE — TOC Progression Note (Signed)
Transition of Care [facility]) - Progression Note    Patient Details  Name: Kitana Gilani MRN: [id number] Date of Birth: Aug 02, 1960  Transition of Care [facility]) CM/SW [place] Phone Number: 06/20/2022, 3:36 PM  Clinical Narrative:   CSW following for SNF placement. [place] reported that Christella Scheuermann was asking for therapy updates. CSW asked therapy to see patient, and then sent therapy notes to [facility]. Charna Archer passed on to [place], still awaiting approval. CSW met with sister at bedside to provide update. CSW to follow.    Expected Discharge Plan: [place] Barriers to Discharge: Continued Medical Work up, Ship broker  Expected Discharge Plan and Services     Post Acute Care Choice: [place] Living arrangements for the past 2 months: Single Family Home Expected Discharge Date: 06/18/22                                     Social Determinants of Health (SDOH) Interventions SDOH Screenings   Food Insecurity: No Food Insecurity (05/29/2022)  Housing: Low Risk  (05/29/2022)  Transportation Needs: No Transportation Needs (05/29/2022)  Utilities: Not At Risk (05/29/2022)  Depression (PHQ2-9): Low Risk  (03/16/2022)  Tobacco Use: Low Risk  (06/09/2022)    Readmission Risk Interventions     No data to display

## 2022-06-21 LAB — GLUCOSE, CAPILLARY
Glucose-Capillary: 184 mg/dL — ABNORMAL HIGH (ref 70–99)
Glucose-Capillary: 135 mg/dL — ABNORMAL HIGH (ref 70–99)

## 2022-06-21 IMAGING — CT CT HEAD W/O CM
3 series · 15 of 45 positions shown, 18 images · non-contrast
Comparison: MR head, dated November 19, 2019 and plain brain CT, dated
May 10, 2010

CLINICAL DATA: Dizziness and blurred vision.

EXAM:
CT HEAD WITHOUT CONTRAST
TECHNIQUE: Contiguous axial images were obtained from the base of the skull
through the vertex without intravenous contrast.

[Series 2: head wo · axial · 0.47mm/px · z∈[-66,+49]mm · 9 of 28 slices shown, 12 images]
[im 3/28  brain]
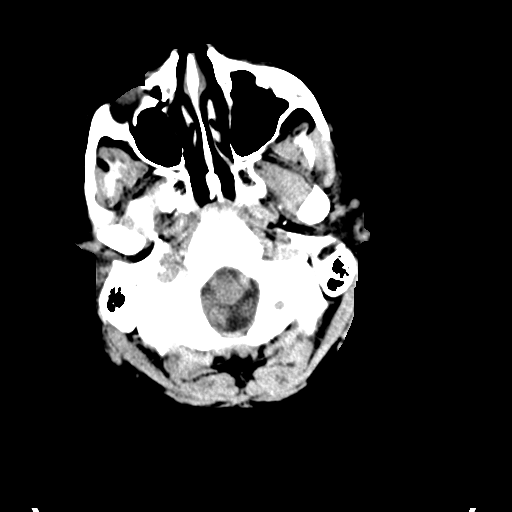
[im 3/28  bone]
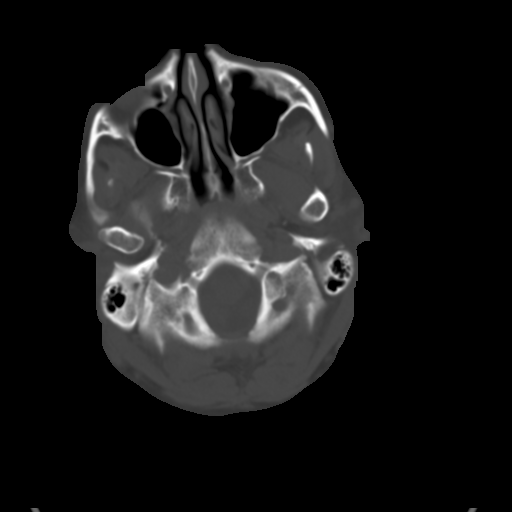
[im 6/28  brain]
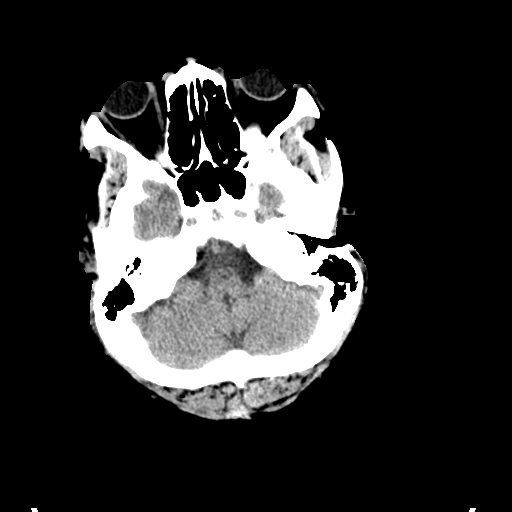
[im 9/28  brain]
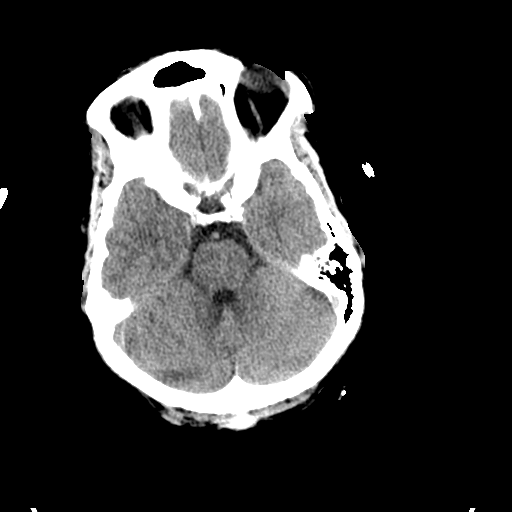
[im 12/28  brain]
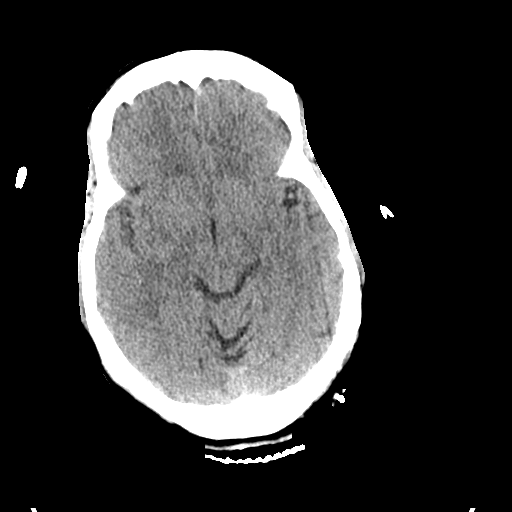
[im 15/28  brain]
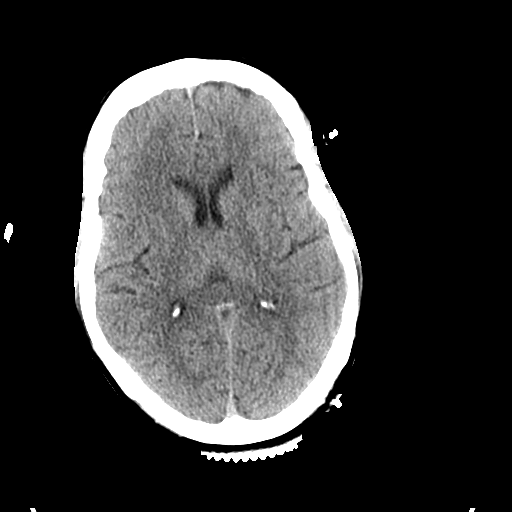
[im 15/28  bone]
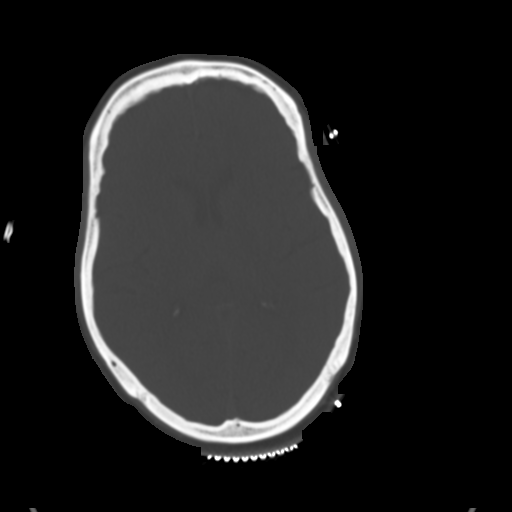
[im 17/28  brain]
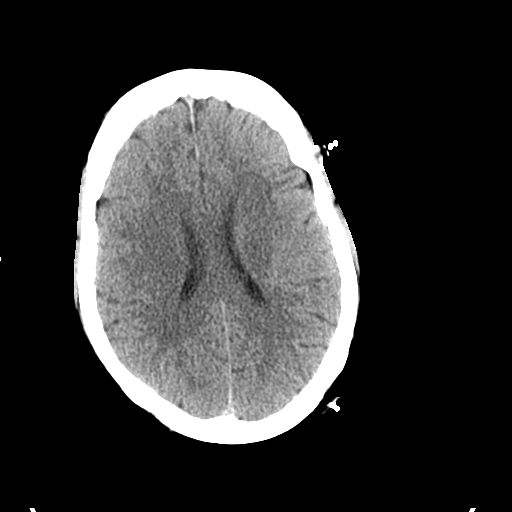
[im 20/28  brain]
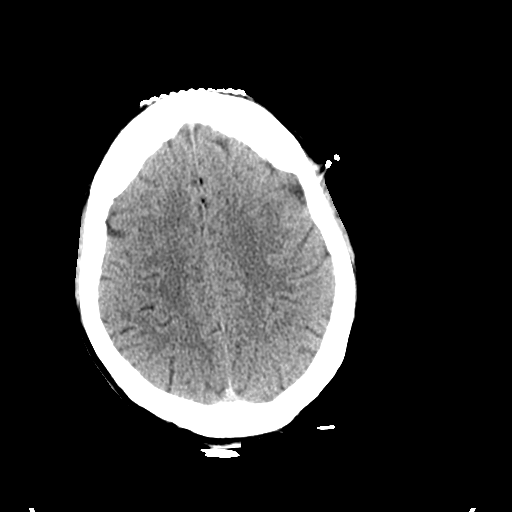
[im 23/28  brain]
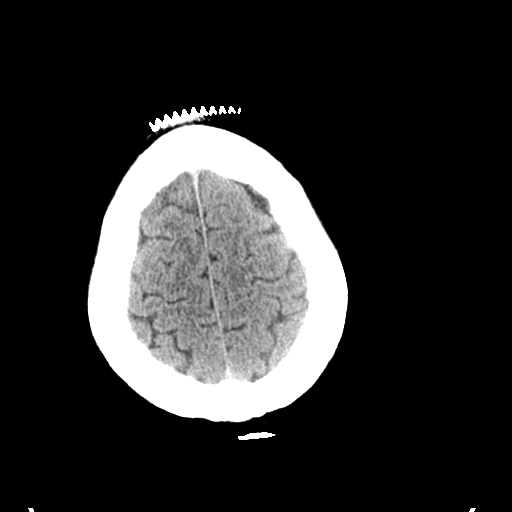
[im 26/28  brain]
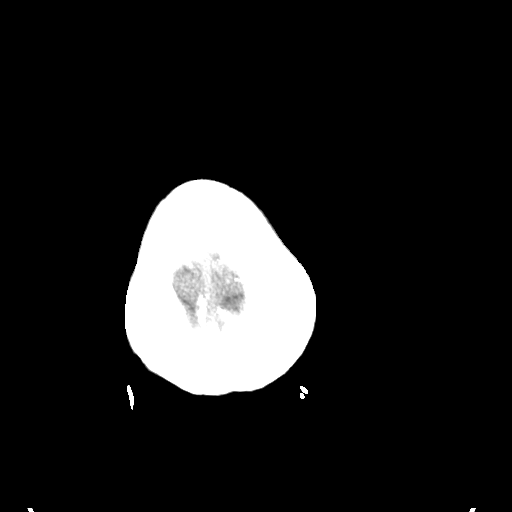
[im 26/28  bone]
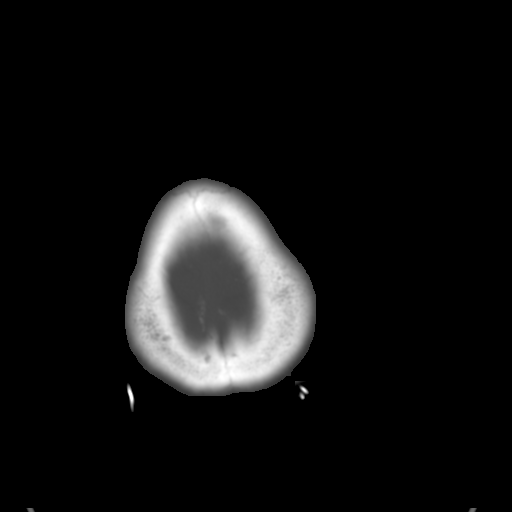

[Series 4: coronal soft tissue · coronal · 0.27mm/px · 3 of 65 slices shown]
[im 22/65  brain]
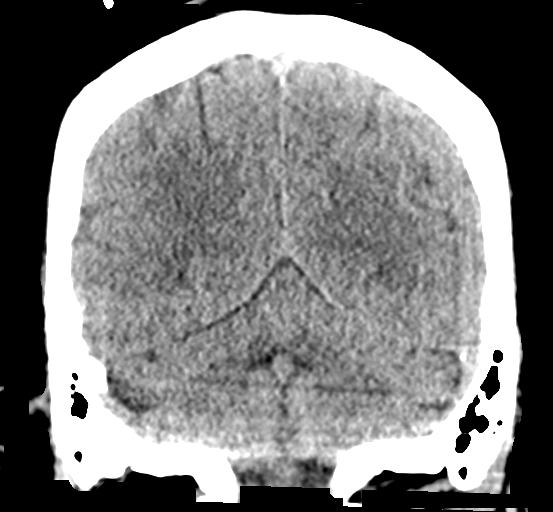
[im 29/65  brain]
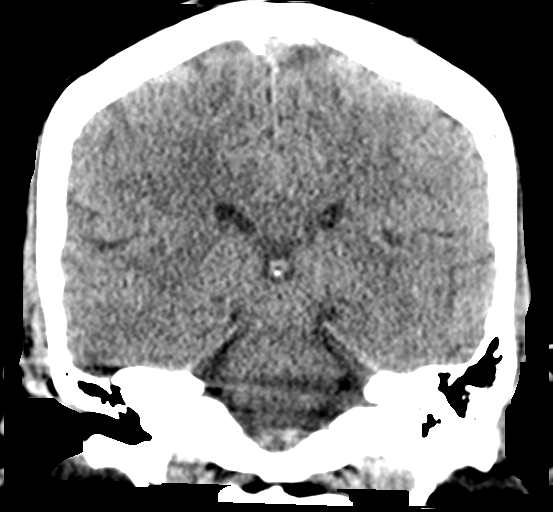
[im 36/65  brain]
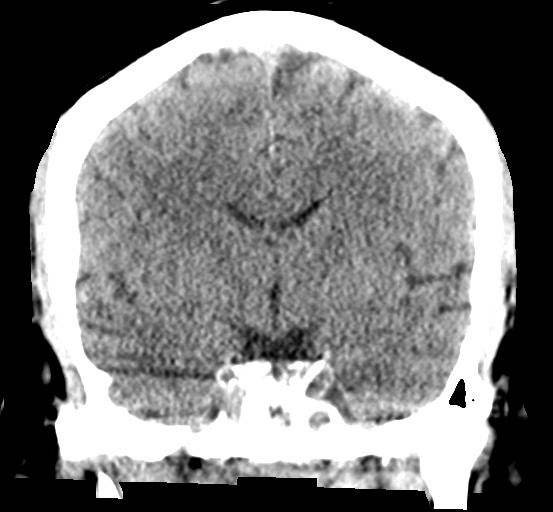

[Series 5: sagittal soft tissue · sagittal · 0.27mm/px · 3 of 47 slices shown]
[im 16/47  brain]
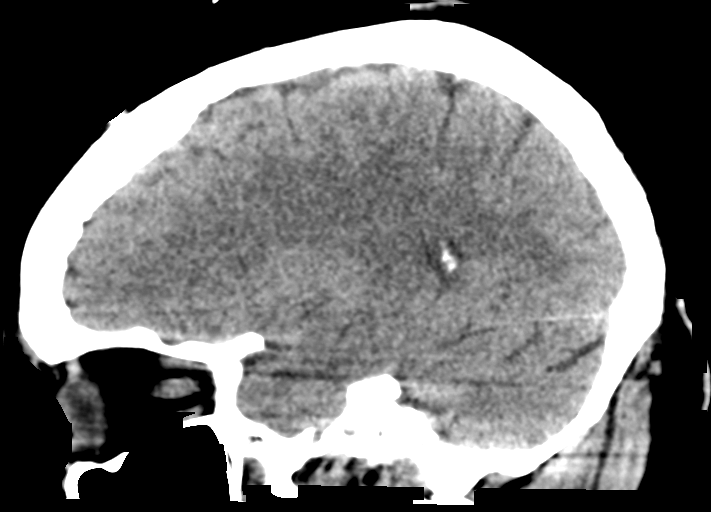
[im 24/47  brain]
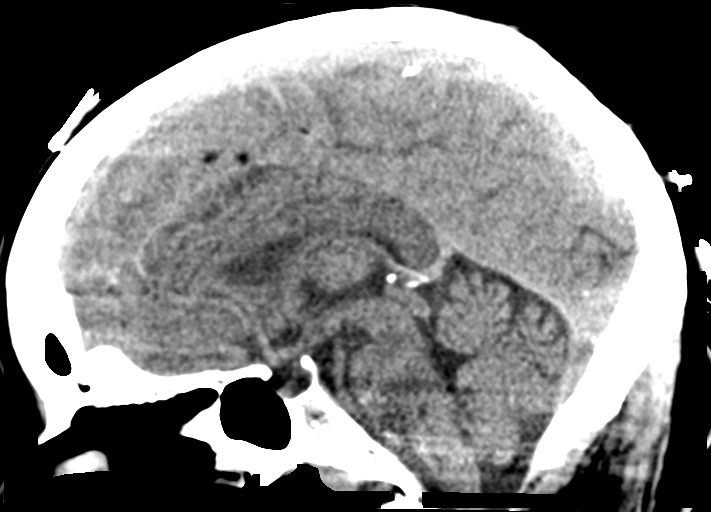
[im 31/47  brain]
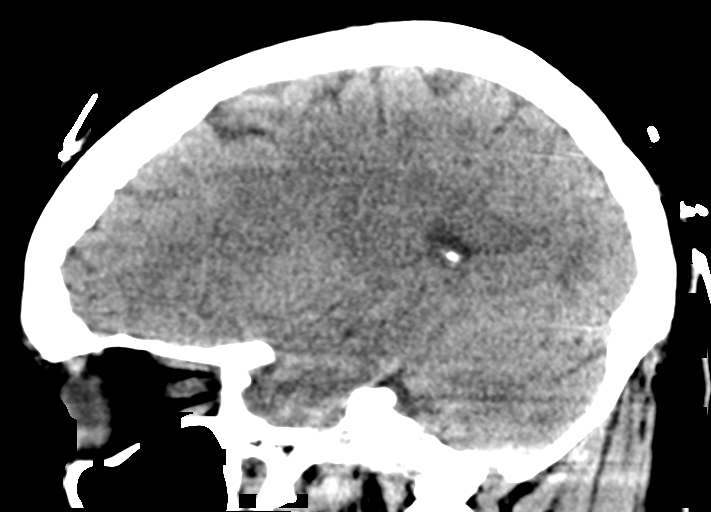

[15 of 45 positions shown; findings below may reference images not displayed]

FINDINGS: Brain: No evidence of acute infarction, hemorrhage, hydrocephalus,
extra-axial collection or mass lesion/mass effect.

Vascular: No hyperdense vessel or unexpected calcification.

Skull: Normal. Negative for fracture or focal lesion.

Sinuses/Orbits: No acute finding.

Other: None.
IMPRESSION: No acute intracranial pathology.

## 2022-06-21 IMAGING — MR MR HEAD W/O CM
11 series · 48 of 48 positions shown · non-contrast
Comparison: CT head from the same day. MRI head from November 19, 2019.

CLINICAL DATA: Neuro deficit, acute stroke suspected. Dizziness,
weakness, bilateral pleura vision.

EXAM:
MRI HEAD WITHOUT CONTRAST
TECHNIQUE: Multiplanar, multiecho pulse sequences of the brain and surrounding
structures were obtained without intravenous contrast.

[Series 5: DWI · axial · 3.0mm · 1.36mm/px · z∈[-51,+101]mm · 6 of 104 slices shown (1 of 4)]
[im 1/104]
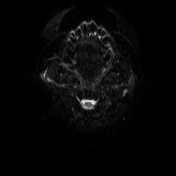
[im 21/104]
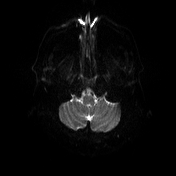
[im 42/104]
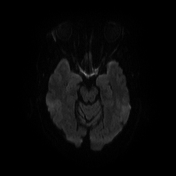
[im 62/104]
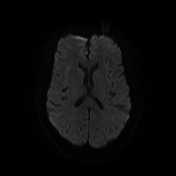
[im 83/104]
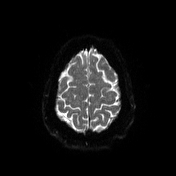
[im 104/104]
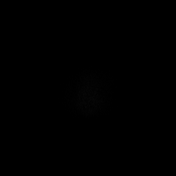

[Series 6: DWI · axial · 3.0mm · 1.36mm/px · z∈[-51,+101]mm · 4 of 52 slices shown (2 of 4)]
[im 1/52]
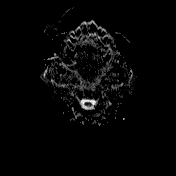
[im 18/52]
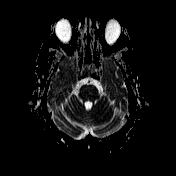
[im 35/52]
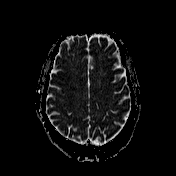
[im 52/52]
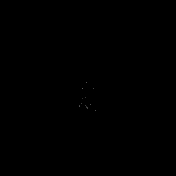

[Series 7: T1 · sagittal · 5.0mm · 0.75mm/px · 2 of 24 slices shown (1 of 2)]
[im 1/24]
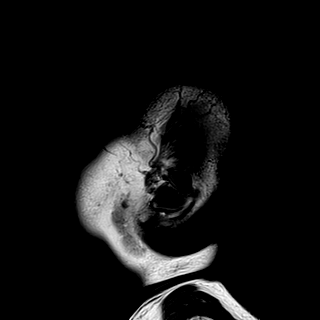
[im 24/24]
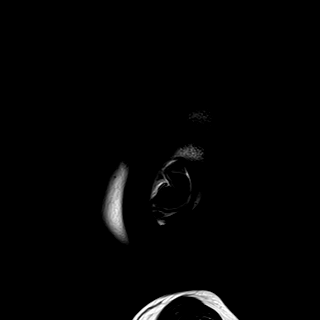

[Series 8: T2 · axial · 5.0mm · 0.62mm/px · z∈[-56,+111]mm · 2 of 27 slices shown (1 of 2)]
[im 1/27]
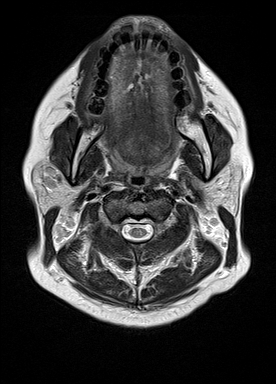
[im 27/27]
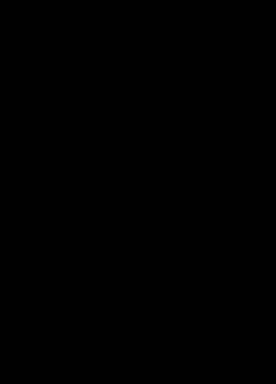

[Series 9: mip_images(sw) · axial · 24.0mm · 0.75mm/px · z∈[-43,+99]mm · 4 of 49 slices shown]
[im 1/49]
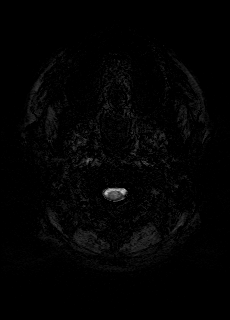
[im 17/49]
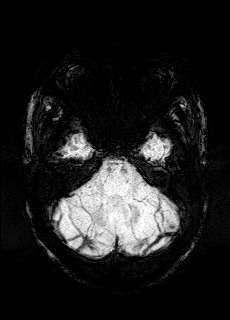
[im 33/49]
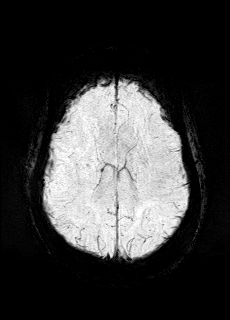
[im 49/49]
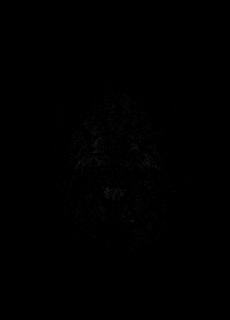

[Series 10: swi_images · axial · 3.0mm · 0.75mm/px · z∈[-54,+109]mm · 4 of 56 slices shown]
[im 1/56]
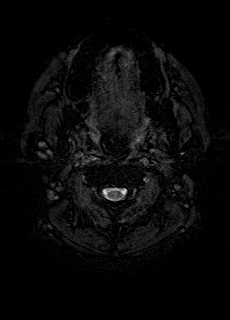
[im 19/56]
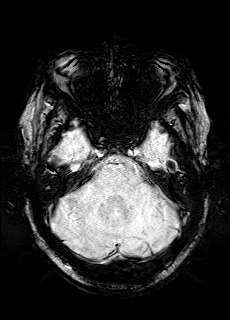
[im 37/56]
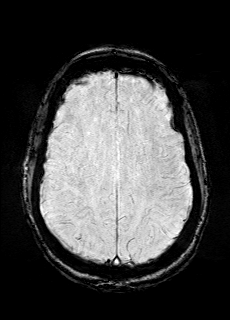
[im 56/56]
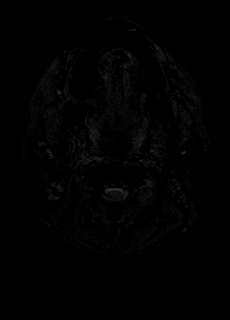

[Series 11: FLAIR · axial · 3.0mm · 0.75mm/px · z∈[-48,+103]mm · 4 of 52 slices shown]
[im 1/52]
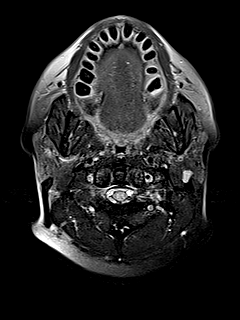
[im 18/52]
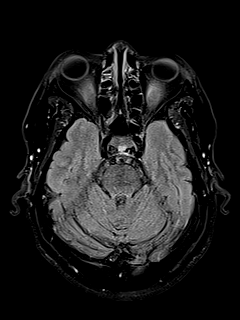
[im 35/52]
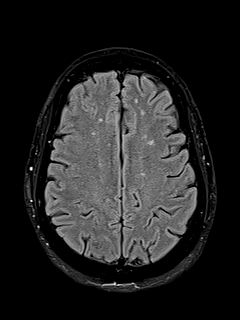
[im 52/52]
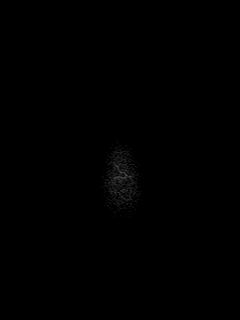

[Series 12: T1 · axial · 1.0mm · 0.94mm/px · z∈[-50,+107]mm · 12 of 160 slices shown (2 of 2)]
[im 1/160]
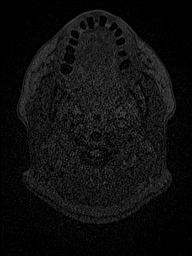
[im 15/160]
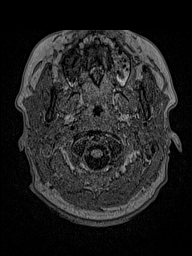
[im 29/160]
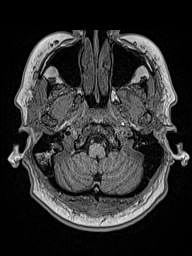
[im 44/160]
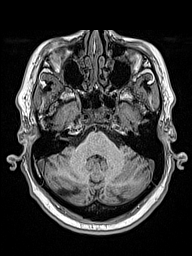
[im 58/160]
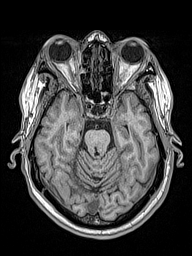
[im 73/160]
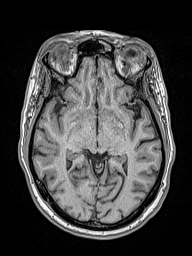
[im 87/160]
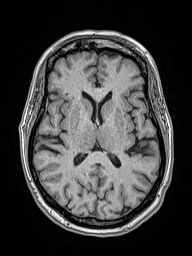
[im 102/160]
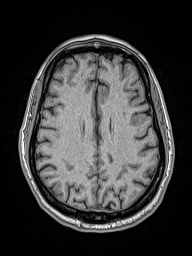
[im 116/160]
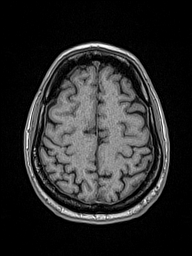
[im 131/160]
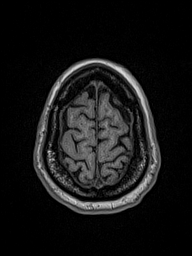
[im 145/160]
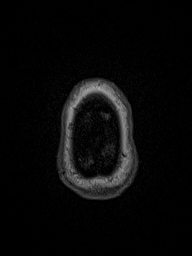
[im 160/160]
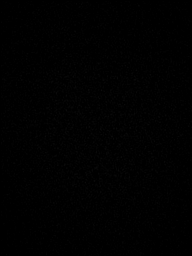

[Series 13: DWI · coronal · 5.0mm · 1.31mm/px · 5 of 72 slices shown (3 of 4)]
[im 1/72]
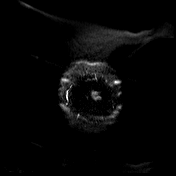
[im 18/72]
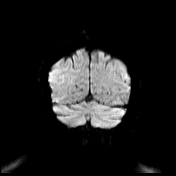
[im 36/72]
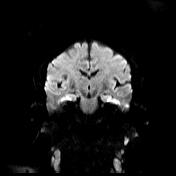
[im 54/72]
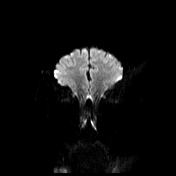
[im 72/72]
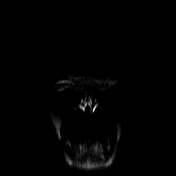

[Series 14: DWI · coronal · 5.0mm · 1.31mm/px · 3 of 36 slices shown (4 of 4)]
[im 1/36]
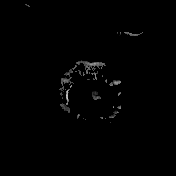
[im 18/36]
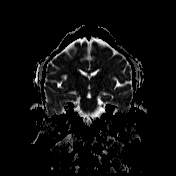
[im 36/36]
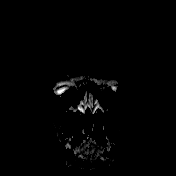

[Series 15: T2 · coronal · 5.0mm · 0.57mm/px · 2 of 34 slices shown (2 of 2)]
[im 1/34]
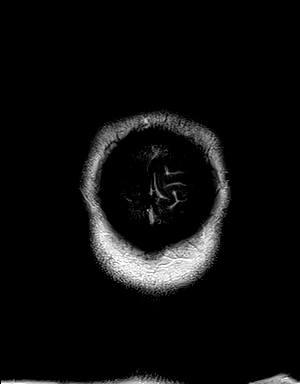
[im 34/34]
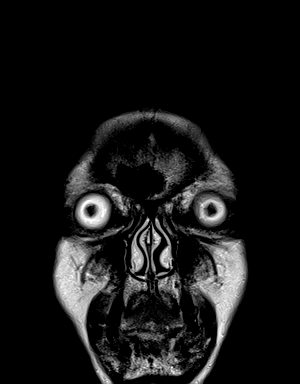

[48 of 48 positions shown; findings below may reference images not displayed]

FINDINGS: Brain: No acute infarction, hemorrhage, hydrocephalus, extra-axial
collection or mass lesion. Small focus of susceptibility artifact in
the right thalamus, likely related to chronic microhemorrhage.
Scattered T2/FLAIR hyperintensities within the white matter, likely
related to chronic microvascular ischemic disease.

Vascular: Major arterial flow voids are maintained at the skull
base.

Skull and upper cervical spine: No focal skeletal lesion.

Sinuses/Orbits: Visualized sinuses are clear.  Unremarkable orbits.

Other: Partially imaged cervical lymphadenopathy, better
characterized on CT neck from October 15, 2019.
IMPRESSION: 1. No evidence of acute intracranial abnormality. Specifically, no
acute infarct.
2. Partially imaged cervical lymphadenopathy, better characterized
on CT neck from October 15, 2019.

## 2022-06-21 NOTE — Progress Notes (Signed)
Mobility Specialist: Progress Note   06/21/22 1000  Mobility  Activity Transferred from bed to chair  Level of Assistance +2 (takes two people)  Assistive Device Stedy  Activity Response Tolerated fair  Mobility Referral Yes  $Mobility charge 1 Mobility   Pt received in the bed and agreeable to mobility. ModA with bed mobility as well as to stand. +2 for safety/balance while sitting EOB while donning shoes/AFO. Bowel incontinent upon standing and assisted with pericare with help from NT. No c/o throughout. Pt is in the chair with call bell at her side. Chair alarm is on.   [place] Mobility Specialist Please contact via SecureChat or Rehab office at [phone]

## 2022-06-21 NOTE — TOC Transition Note (Signed)
Transition of Care [facility]) - CM/SW Discharge Note   Patient Details  Name: [name] MRN: [id number] Date of Birth: 01-Dec-1960  Transition of Care [facility]) CM/SW Contact:  Geralynn Ochs, LCSW Phone Number: 06/21/2022, 11:34 AM   Clinical Narrative:   Patient insurance authorization has been received, can admit to [facility] today. CSW notified sister, Hassan Rowan, who is in agreement, requesting that patient be picked up after she gets off work so she can be here. Transport arranged with [facility] for after 3.   Nurse to call report to [phone], room [phone].    Final next level of care: Skilled Nursing Facility Barriers to Discharge: Barriers Resolved   Patient Goals and CMS Choice CMS Medicare.gov Compare Post Acute Care list provided to:: Patient Choice offered to / list presented to : Patient  Discharge Placement                Patient chooses bed at:  [facility]) Patient to be transferred to facility by: [place] Name of family member notified: Hassan Rowan Patient and family notified of of transfer: 06/21/22  Discharge Plan and Services Additional resources added to the After Visit Summary for       Post Acute Care Choice: [place]                               Social Determinants of Health (SDOH) Interventions SDOH Screenings   Food Insecurity: No Food Insecurity (05/29/2022)  Housing: Low Risk  (05/29/2022)  Transportation Needs: No Transportation Needs (05/29/2022)  Utilities: Not At Risk (05/29/2022)  Depression (PHQ2-9): Low Risk  (03/16/2022)  Tobacco Use: Low Risk  (06/09/2022)     Readmission Risk Interventions     No data to display

## 2022-06-21 NOTE — Progress Notes (Signed)
Nursing Discharge Note   Admit Date: 06/21/2022  Discharge date: 06/21/2022   [name] to be discharged to a [facility] per MD order.  AVS completed, placed in discharge packet for facility review. Discharge packet compiled for facility. Non-emergency ambulance transport arranged. Report called to [facility] by Elberta Leatherwood.    Allergies as of 06/21/2022   No Known Allergies      Medication List     STOP taking these medications    cilostazol 50 MG tablet Commonly known as: PLETAL   clopidogrel 75 MG tablet Commonly known as: PLAVIX   insulin glargine-yfgn 100 UNIT/ML injection Commonly known as: SEMGLEE   insulin lispro 100 UNIT/ML KwikPen Commonly known as: HUMALOG   lisinopril-hydrochlorothiazide 20-12.5 MG tablet Commonly known as: ZESTORETIC       TAKE these medications    amLODipine 10 MG tablet Commonly known as: NORVASC Take 1 tablet by mouth once daily   aspirin EC 81 MG tablet Take 1 tablet (81 mg total) by mouth daily. Swallow whole.   atorvastatin 80 MG tablet Commonly known as: Lipitor Take 1 tablet (80 mg total) by mouth daily.   Blood Pressure Monitor Automat Devi 1 Device by Does not apply route daily.   ezetimibe 10 MG tablet Commonly known as: ZETIA Take 1 tablet (10 mg total) by mouth daily.   FLUoxetine 20 MG capsule Commonly known as: PROZAC Take 1 capsule by mouth once daily   glucose blood test strip Commonly known as: OneTouch Verio USE TO CHECK BLOOD SUGAR TWICE A DAY AND PRN   glucose blood test strip Commonly known as: Contour Next Test 1 each by Other route 2 (two) times daily. And lancets 2/day   hydrALAZINE 50 MG tablet Commonly known as: APRESOLINE TAKE 1 TABLET BY MOUTH THREE TIMES DAILY   insulin aspart 100 UNIT/ML injection Commonly known as: novoLOG Inject 0-15 Units into the skin 3 (three) times daily with meals. CBG < 70: Implement Hypoglycemia Standing Orders and refer to  Hypoglycemia Standing Orders sidebar report CBG 70 - 120: 0 units CBG 121 - 150: 2 units CBG 151 - 200: 3 units CBG 201 - 250: 5 units CBG 251 - 300: 8 units CBG 301 - 350: 11 units CBG 351 - 400: 15 units   lidocaine 5 % Commonly known as: LIDODERM Place 1 patch onto the skin daily. Remove & Discard patch within 12 hours or as directed by MD   loperamide 2 MG capsule Commonly known as: IMODIUM Take 2 capsules (4 mg total) by mouth 3 (three) times daily as needed for diarrhea or loose stools.   ONE TOUCH LANCETS Misc USE TO CHECK BLOOD SUGAR TWICE A DAY AND PRN   pantoprazole 40 MG tablet Commonly known as: PROTONIX Take 1 tablet (40 mg total) by mouth daily.   saccharomyces boulardii 250 MG capsule Commonly known as: FLORASTOR Take 1 capsule (250 mg total) by mouth 2 (two) times daily.   ticagrelor 90 MG Tabs tablet Commonly known as: BRILINTA Take 1 tablet (90 mg total) by mouth 2 (two) times daily.   Trulicity [id number] [id number] Sopn Generic drug: Dulaglutide Inject 0.75 mg into the skin once a week.   Vitamin B Complex Tabs Take 1 tablet by mouth every morning.   VITAMIN D3 PO Take 1 capsule by mouth every morning.         Discharge Instructions     Ambulatory referral to Neurology   Complete by: As directed  An appointment is requested in approximately: 8 weeks   Call MD for:  difficulty breathing, headache or visual disturbances   Complete by: As directed    Call MD for:  extreme fatigue   Complete by: As directed    Call MD for:  persistant dizziness or light-headedness   Complete by: As directed    Call MD for:  persistant nausea and vomiting   Complete by: As directed    Call MD for:  severe uncontrolled pain   Complete by: As directed    Call MD for:  temperature >100.4   Complete by: As directed    Discharge instructions   Complete by: As directed    Please review instructions on the discharge summary.  You were cared for by a hospitalist during  your hospital stay. If you have any questions about your discharge medications or the care you received while you were in the hospital after you are discharged, you can call the unit and asked to speak with the hospitalist on call if the hospitalist that took care of you is not available. Once you are discharged, your primary care physician will handle any further medical issues. Please note that NO REFILLS for any discharge medications will be authorized once you are discharged, as it is imperative that you return to your primary care physician (or establish a relationship with a primary care physician if you do not have one) for your aftercare needs so that they can reassess your need for medications and monitor your lab values. If you do not have a primary care physician, you can call [phone] for a physician referral.   Increase activity slowly   Complete by: As directed           [facility] to provide transportation to facility for patient. Non-emergency ambulance transport at bedside. Handoff completed with [facility] staff/EMTs.   Patient discharged from hospital unit via stretcher. Stable at time of discharge.

## 2022-06-21 NOTE — Progress Notes (Signed)
Mobility Specialist: Progress Note   06/21/22 1255  Mobility  Activity Transferred from chair to bed  Level of Assistance +2 (takes two people)  Assistive Device Stedy  Activity Response Tolerated well  Mobility Referral Yes  $Mobility charge 1 Mobility   Pt assisted back to bed per request. MaxA to stand from the chair. No c/o throughout. Pt back to bed with call bell in reach. Bed alarm is on.   [place] Mobility Specialist Please contact via SecureChat or Rehab office at [phone]

## 2022-06-21 NOTE — Discharge Summary (Incomplete)
Pt wheeled off unit by [facility]. Dressed pt in preparation to leave. Family took pt's belongings.

## 2022-06-21 NOTE — Discharge Summary (Signed)
[facility]  Physician Discharge Summary   Patient ID: [name] MRN: [id number] DOB/AGE: 1961/01/29 62 y.o.  Admit date: 06/12/2022 Discharge date:   06/21/2022   PCP: [name], [name], MD  DISCHARGE DIAGNOSES:    Acute CVA (cerebrovascular accident) [facility])   Dysarthria Aspiration pneumonitis Diarrhea, resolved Essential hypertension Insulin-dependent diabetes mellitus Peripheral artery disease Chronic diastolic CHF Microcytic anemia   RECOMMENDATIONS FOR OUTPATIENT FOLLOW UP: Please check CBC and basic metabolic panel in 3 to 4 days Ambulatory referral has been sent to neurology for outpatient follow-up See below regarding reinitiating antihypertensives.   Home Health: Needs SNF Equipment/Devices: None  CODE STATUS: Full code  DISCHARGE CONDITION: fair  Diet recommendation: Modified carbohydrate with thin liquids  INITIAL HISTORY: [name] is a 63 y.o. female with medical history significant of multiple strokes in 2022, 2023 and 2024, HTN, HLD, IDDM, PVD, non-Hodgkin's lymphoma, sickle cell trait, CKD stage IIIa, was found to have new onset of right-sided weakness and worsening of dysarthria and drooling at [facility] hospital inpatient rehab unit.   Patient admitted 2 weeks ago secondary to new onset of right medulla stroke,subsequently discharged to inpt rehab - L sided weakness/dysarthria noted at time of discharge. Staff at rehab on 06/12/22 noted seemingly new weakness of right arm and hand, when patient was unable to hold anything on her right hand as well as worsening of slurred speech as well as new left-sided drooling.  Neurology consulted, given that exact onset of her symptoms is unknown, decision made to not give TNK.  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 Admitted to hospitalist service with neurology consult.  Consultations: Neurology    HOSPITAL COURSE:   Acute extension of previous  stroke, with new onset of right-sided paresis, dysphagia and facial droop -MRI confirms "Increased size of a recent right medullary infarct which now extends to the left of midline.New punctate acute left cerebellar infarct." -Neurology following, recommending aspirin and Brilinta for 3 months followed by aspirin alone.  If there is difficulty in affording Brilinta then consider switching to aspirin and Plavix after 4 weeks. Initially there was concern about her ability to take medications orally but looks like her oral intake has improved. Initially there was plan for the patient to go back to CIR but does not appear that that is an option currently.  She needs to go to skilled nursing facility for rehab.   Probable aspiration pneumonitis Pneumonia less likely but cannot be ruled out Secondary to above presumed worsening dysphagia Patient was started on Unasyn.  However patient started experiencing diarrhea.  Since her respiratory status was otherwise stable Unasyn was discontinued.  Procalcitonin was unremarkable.  Respiratory status has been stable for the past several days.   Acute diarrhea Most likely due to antibiotics.  Abdomen was benign examination.  She was given Imodium and probiotics.  Antibiotics were discontinued.   Diarrhea has resolved for the most part.  Occasional loose stools noted but it is not watery.  No concern for infectious etiology.  Continue Imodium as needed.  Continue probiotics.   Essential hypertension Initially allowed permissive hypertension.  Blood pressure to be gradually decreased.  Amlodipine was resumed.  Can also resume hydralazine at discharge.  Lisinopril can be reinitiated depending on blood pressures and blood work over the next 3 to 4 days.   IDDM, uncontrolled with hyperglycemia A1c 11.4 just last month. Monitor CBG's.  Continue just SSI for now.  Glargine can be reintroduced  depending on glucose levels.     PAD Will hold cilostazol while patient on  DAPT of aspirin and Brilinta   CKD stage IIIa Stable   Chronic HFpEF Stable for the most part.   Microcytic anemia No evidence of overt bleeding.  Outpatient management.    Obesity Estimated body mass index is 31.9 kg/m as calculated from the following:   Height as of 06/04/22: 5' 4"$  (1.626 m).   Weight as of this encounter: 84.3 kg.  Patient remains stable.  Okay for discharge to skilled nursing facility when bed is available.      PERTINENT LABS:  The results of significant diagnostics from this hospitalization (including imaging, microbiology, ancillary and laboratory) are listed below for reference.    Labs:   Basic Metabolic Panel: Recent Labs  Lab 06/16/22 0633 06/17/22 0702 06/19/22 1201  NA 137 137 135  K 3.8 3.8 3.7  CL 105 107 105  CO2 18* 20* 19*  GLUCOSE 162* 126* 172*  BUN 18 19 17  $ CREATININE 1.16* 1.20* 1.12*  CALCIUM 9.4 9.3 9.3   Liver Function Tests: Recent Labs  Lab 06/17/22 0702  AST 27  ALT 42  ALKPHOS 83  BILITOT 0.3  PROT 6.1*  ALBUMIN 2.2*    CBC: Recent Labs  Lab 06/16/22 0633 06/17/22 0702  WBC 14.3* 10.1  HGB 11.5* 10.7*  HCT 35.0* 33.1*  MCV 77.4* 77.5*  PLT 271 240     CBG: Recent Labs  Lab 06/20/22 0609 06/20/22 1214 06/20/22 1637 06/20/22 2113 06/21/22 0558  GLUCAP 185* 141* 235* 188* 184*     IMAGING STUDIES DG Chest 1 View  Result Date: 06/13/2022 CLINICAL DATA:  Aspiration pneumonia EXAM: CHEST  1 VIEW COMPARISON:  Radiograph 06/12/2022 FINDINGS: Unchanged cardiomediastinal silhouette. Bandlike atelectasis in the lower lungs. No new airspace disease. No pleural effusion or evidence of pneumothorax. Bones are unchanged. IMPRESSION: No new airspace disease. Electronically Signed   By: Maurine Simmering M.D.   On: 06/13/2022 07:58   DG Chest 1 View  Result Date: 06/12/2022 CLINICAL DATA:  Pneumonia EXAM: CHEST  1 VIEW COMPARISON:  Chest x-ray 05/29/2022 FINDINGS: Cardiomediastinal silhouette is within normal  limits. There is a linear band of atelectasis or scarring in the left mid lung similar to prior. Lungs are otherwise clear. No pleural effusion or pneumothorax. No acute fractures. IMPRESSION: No acute cardiopulmonary process.  No significant interval change. Electronically Signed   By: Ronney Asters M.D.   On: 06/12/2022 16:17   MR BRAIN WO CONTRAST  Result Date: 06/12/2022 CLINICAL DATA:  Neuro deficit, acute, stroke suspected. New right sided weakness, worsening aphasia. EXAM: MRI HEAD WITHOUT CONTRAST TECHNIQUE: Multiplanar, multiecho pulse sequences of the brain and surrounding structures were obtained without intravenous contrast. COMPARISON:  Head CT 06/11/2022 and MRI/MRA 05/30/2022 FINDINGS: Multiple sequences are up to moderately motion degraded. Brain: The acute infarct in the right ventral medulla on the prior MRI has enlarged and now extends to the left of midline. There is also a new punctate acute infarct in the left cerebellar hemisphere. Small T2 hyperintensities in the cerebral white matter bilaterally are unchanged and nonspecific but compatible with mild chronic small vessel ischemic disease chronic microhemorrhages are again noted in the right thalamus and right parietal white matter. Small chronic bilateral cerebellar infarcts are again noted with hemosiderin deposition associated with 1 of the left-sided infarcts. The ventricles and sulci are normal. No mass, midline shift, or extra-axial fluid collection is identified. Vascular: Unchanged abnormal appearance  of the distal right vertebral artery, more fully evaluated on the prior MRA. Skull and upper cervical spine: No suspicious marrow lesion. Sinuses/Orbits: Unremarkable orbits. Mucous retention cyst in the right maxillary sinus. Small left mastoid effusion. Other: Partially visualized bilateral cervical lymphadenopathy with history of lymphoma. IMPRESSION: 1. Increased size of a recent right medullary infarct which now extends to the  left of midline. 2. New punctate acute left cerebellar infarct. 3. Mild chronic small vessel ischemic disease. Chronic cerebellar infarcts. These results will be called to the ordering clinician or representative by the Radiologist Assistant, and communication documented in the PACS or [employer]. Electronically Signed   By: Logan Bores M.D.   On: 06/12/2022 13:24   CT HEAD WO CONTRAST (5MM)  Result Date: 06/11/2022 CLINICAL DATA:  Follow-up examination for stroke, slurred speech. EXAM: CT HEAD WITHOUT CONTRAST TECHNIQUE: Contiguous axial images were obtained from the base of the skull through the vertex without intravenous contrast. RADIATION DOSE REDUCTION: This exam was performed according to the departmental dose-optimization program which includes automated exposure control, adjustment of the mA and/or kV according to patient size and/or use of iterative reconstruction technique. COMPARISON:  Comparison made with prior CT and MRI from 05/30/2022. FINDINGS: Brain: Cerebral volume within normal limits for age. Mild chronic small vessel ischemic disease noted. Previously identified small medullary infarct not visible by CT. No other acute large vessel territory infarct. No intracranial hemorrhage. No mass lesion or midline shift. No hydrocephalus or extra-axial fluid collection. Vascular: No abnormal hyperdense vessel. Calcified atherosclerosis present about the skull base. Skull: Scalp soft tissues and calvarium within normal limits. Sinuses/Orbits: Globes and orbital soft tissues demonstrate no acute finding. Paranasal sinuses remain largely clear. No mastoid effusion. Other: None. IMPRESSION: 1. No acute intracranial abnormality. 2. Mild chronic small vessel ischemic disease. Previously identified small medullary infarct not visible by CT. Electronically Signed   By: Jeannine Boga M.D.   On: 06/11/2022 21:07   ECHOCARDIOGRAM COMPLETE  Result Date: 05/31/2022    ECHOCARDIOGRAM REPORT    Patient Name:   EARICA CZYZEWSKI Date of Exam: 05/31/2022 Medical Rec #:  [id number]       Height:       64.0 in Accession #:    [id number]      Weight:       179.0 lb Date of Birth:  1961-03-12       BSA:          1.866 m Patient Age:    76 years        BP:           168/93 mmHg Patient Gender: F               HR:           72 bpm. Exam Location:  Inpatient Procedure: 2D Echo, Cardiac Doppler and Color Doppler Indications:    Stroke  History:        Patient has prior history of Echocardiogram examinations.                 Stroke; Risk Factors:Hypertension, Diabetes and Dyslipidemia.  Sonographer:    Phineas Douglas Referring Phys: [phone] AMRIT ADHIKARI IMPRESSIONS  1. Left ventricular ejection fraction, by estimation, is 60 to 65%. The left ventricle has normal function. The left ventricle has no regional wall motion abnormalities. There is mild concentric left ventricular hypertrophy. Left ventricular diastolic parameters are consistent with Grade I diastolic dysfunction (impaired relaxation).  2. Right ventricular systolic function  is normal. The right ventricular size is normal. Tricuspid regurgitation signal is inadequate for assessing PA pressure.  3. The mitral valve is grossly normal. No evidence of mitral valve regurgitation. No evidence of mitral stenosis.  4. The aortic valve is tricuspid. Aortic valve regurgitation is not visualized. No aortic stenosis is present.  5. The inferior vena cava is normal in size with greater than 50% respiratory variability, suggesting right atrial pressure of 3 mmHg. Conclusion(s)/Recommendation(s): No intracardiac source of embolism detected on this transthoracic study. Consider a transesophageal echocardiogram to exclude cardiac source of embolism if clinically indicated. FINDINGS  Left Ventricle: Left ventricular ejection fraction, by estimation, is 60 to 65%. The left ventricle has normal function. The left ventricle has no regional wall motion abnormalities. The left  ventricular internal cavity size was normal in size. There is  mild concentric left ventricular hypertrophy. Left ventricular diastolic parameters are consistent with Grade I diastolic dysfunction (impaired relaxation). Right Ventricle: The right ventricular size is normal. No increase in right ventricular wall thickness. Right ventricular systolic function is normal. Tricuspid regurgitation signal is inadequate for assessing PA pressure. Left Atrium: Left atrial size was normal in size. Right Atrium: Right atrial size was normal in size. Pericardium: Trivial pericardial effusion is present. Mitral Valve: The mitral valve is grossly normal. No evidence of mitral valve regurgitation. No evidence of mitral valve stenosis. Tricuspid Valve: The tricuspid valve is grossly normal. Tricuspid valve regurgitation is trivial. No evidence of tricuspid stenosis. Aortic Valve: The aortic valve is tricuspid. Aortic valve regurgitation is not visualized. No aortic stenosis is present. Pulmonic Valve: The pulmonic valve was grossly normal. Pulmonic valve regurgitation is trivial. No evidence of pulmonic stenosis. Aorta: The aortic root and ascending aorta are structurally normal, with no evidence of dilitation. Venous: The inferior vena cava is normal in size with greater than 50% respiratory variability, suggesting right atrial pressure of 3 mmHg. IAS/Shunts: The atrial septum is grossly normal.  LEFT VENTRICLE PLAX 2D LVIDd:         3.60 cm      Diastology LVIDs:         2.30 cm      LV e' medial:    5.68 cm/s LV PW:         1.30 cm      LV E/e' medial:  12.4 LV IVS:        1.20 cm      LV e' lateral:   7.22 cm/s LVOT diam:     2.00 cm      LV E/e' lateral: 9.8 LV SV:         69 LV SV Index:   37 LVOT Area:     3.14 cm  LV Volumes (MOD) LV vol d, MOD A2C: 119.0 ml LV vol d, MOD A4C: 111.0 ml LV vol s, MOD A2C: 44.0 ml LV vol s, MOD A4C: 48.0 ml LV SV MOD A2C:     75.0 ml LV SV MOD A4C:     111.0 ml LV SV MOD BP:      68.6 ml  RIGHT VENTRICLE             IVC RV Basal diam:  3.10 cm     IVC diam: 1.40 cm RV S prime:     13.90 cm/s TAPSE (M-mode): 1.8 cm LEFT ATRIUM             Index        RIGHT ATRIUM  Index LA diam:        3.70 cm 1.98 cm/m   RA Area:     12.00 cm LA Vol (A2C):   51.4 ml 27.54 ml/m  RA Volume:   23.00 ml  12.32 ml/m LA Vol (A4C):   56.6 ml 30.33 ml/m LA Biplane Vol: 58.1 ml 31.13 ml/m  AORTIC VALVE LVOT Vmax:   93.20 cm/s LVOT Vmean:  62.800 cm/s LVOT VTI:    0.220 m  AORTA Ao Root diam: 3.10 cm Ao Asc diam:  3.20 cm MITRAL VALVE MV Area (PHT): 2.77 cm    SHUNTS MV Decel Time: 274 msec    Systemic VTI:  0.22 m MV E velocity: 70.70 cm/s  Systemic Diam: 2.00 cm MV A velocity: 95.50 cm/s MV E/A ratio:  0.74 Eleonore Chiquito MD Electronically signed by Eleonore Chiquito MD Signature Date/Time: 05/31/2022/9:44:38 AM    Final    CT HEAD WO CONTRAST (5MM)  Result Date: 05/30/2022 CLINICAL DATA:  Follow-up stroke EXAM: CT HEAD WITHOUT CONTRAST TECHNIQUE: Contiguous axial images were obtained from the base of the skull through the vertex without intravenous contrast. RADIATION DOSE REDUCTION: This exam was performed according to the departmental dose-optimization program which includes automated exposure control, adjustment of the mA and/or kV according to patient size and/or use of iterative reconstruction technique. COMPARISON:  MRI brain dated 05/30/2022 at 1031 hours FINDINGS: Brain: No evidence of acute infarction, hemorrhage, hydrocephalus, extra-axial collection or mass lesion/mass effect. Known tiny acute infarct within the right medulla is not evident on current CT. Mild subcortical white matter and periventricular small vessel ischemic changes. Vascular: Intracranial atherosclerosis. Skull: Normal. Negative for fracture or focal lesion. Sinuses/Orbits: The visualized paranasal sinuses are essentially clear. The mastoid air cells are unopacified. Other: None. IMPRESSION: Known tiny acute infarct within the  right medulla is not evident on current CT. Mild small vessel ischemic changes. Electronically Signed   By: Julian Hy M.D.   On: 05/30/2022 23:55   MR ANGIO HEAD WO CONTRAST  Result Date: 05/30/2022 CLINICAL DATA:  History of non-Hodgkin's lymphoma, hypertension, Guillain-Barre syndrome, and multiple strokes. Complains of dizziness and overall weakness. Possible CVA. EXAM: MRI HEAD WITHOUT CONTRAST MRA HEAD WITHOUT CONTRAST MRA NECK WITHOUT AND WITH CONTRAST TECHNIQUE: Multiplanar, multi-echo pulse sequences of the brain and surrounding structures were acquired without intravenous contrast. Angiographic images of the Circle of Willis were acquired using MRA technique without intravenous contrast. Angiographic images of the neck were acquired using MRA technique without and with intravenous contrast. Carotid stenosis measurements (when applicable) are obtained utilizing NASCET criteria, using the distal internal carotid diameter as the denominator. CONTRAST:  33m GADAVIST GADOBUTROL 1 MMOL/ML IV SOLN COMPARISON:  None Available. CT head 1 day prior, CTA head/neck 09/15/2021 FINDINGS: MRI HEAD FINDINGS Brain: There is diffusion restriction in the right aspect of the medulla with faint associated FLAIR signal abnormality consistent with acute infarct (5-9). There is no associated hemorrhage or mass effect. There is no other evidence of acute infarct. There is no acute intracranial hemorrhage or extra-axial fluid collection. Parenchymal volume is normal. The ventricles are normal in size. Scattered foci of FLAIR signal abnormality in the supratentorial white matter are nonspecific but likely reflects sequela of underlying chronic small-vessel ischemic change, similar to the MRI from 2023. There are small remote infarcts in the bilateral cerebellar hemispheres, at least 1 of which in the left cerebellar hemisphere is new since 09/15/2021. There are punctate chronic microhemorrhages in the right thalamus and  right parietal  white matter, nonspecific but possibly hypertensive. The pituitary and suprasellar region are normal. There is no mass lesion. There is no mass effect or midline shift. Vascular: See below. Skull and upper cervical spine: Normal marrow signal. Sinuses/Orbits: The paranasal sinuses are clear. The globes and orbits are unremarkable. Other: There are prominent lymph nodes in the imaged upper neck on the coronal diffusion sequence which appear overall decreased in size since the prior MRI from 2023 though are incompletely evaluated. MRA HEAD FINDINGS Anterior circulation: There is atherosclerotic irregularity of the carotid siphons resulting in moderate stenosis of the cavernous segment on the left and mild-to-moderate stenosis of the supraclinoid segment on the right. The left M1 segment is patent. There is moderate to severe stenosis of the origin of the superior left M2 division (7-156). The distal left MCA branches appear patent. There is severe stenosis of the distal right M1 segment (1036-3). There is multifocal moderate narrowing of the proximal right superior M2 division. The distal right MCA branches otherwise appear patent. The bilateral ACAs are patent, without proximal stenosis or occlusion. The anterior communicating artery is not definitely identified. There is no aneurysm or AVM. Posterior circulation: There is no flow related enhancement of the right V4 segment on the time-of-flight MRA head. There is intermittent enhancement on the postcontrast MRA neck images. Findings are similar to the CTA from 2023. The PICA origin is not seen on the right. There is multifocal severe stenosis/near occlusion of the left V4 segment, also similar to the prior CT. PICA is identified on the left. The basilar artery is patent with atherosclerotic irregularity and narrowing. There is severe stenosis of the origin of the left P1 segment. There is multifocal atherosclerotic irregularity and narrowing in the  remainder of the left PCA without other high-grade stenosis or occlusion. The right PCA is patent with mild atherosclerotic irregularity but no high-grade stenosis or occlusion. Findings are similar to the prior CTA. There is no aneurysm or AVM. Anatomic variants: None. MRA NECK FINDINGS Aortic arch: The imaged aortic arch is normal. The origins of the major branch vessels are patent. The subclavian arteries are patent to the level imaged. 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 Right carotid system: The right common, internal, and external carotid arteries are patent, with mild irregularity at the bifurcation but no hemodynamically significant stenosis or occlusion. There is no evidence of dissection or aneurysm. Left carotid system: The left common, internal, and external carotid arteries are patent, without significant stenosis or occlusion. There is no evidence of dissection or aneurysm. Vertebral arteries: The right vertebral artery appears patent at its origin. There is short-segment high-grade stenosis/occlusion of the distal V2 segment (15-60). There is diminutive reconstitution with further occlusion of the V3 segment. There is moderate stenosis of the left vertebral artery origin (15-52). The left vertebral artery is otherwise patent in the neck without other hemodynamically significant stenosis or occlusion. There is no evidence of dissection or aneurysm. There is antegrade flow in both vertebral arteries. Other: None IMPRESSION: MR HEAD 1. Small acute infarct in the right aspect of the medulla without hemorrhage or mass effect. 2. Background chronic small-vessel ischemic change with small remote infarcts in the bilateral cerebellar hemispheres, one of which on the left is new since 09/15/2021. 3. Prominent lymph nodes in the upper neck consistent with the history of lymphoma, overall decreased in size since the prior studies from 09/15/2021 but incompletely evaluated. MRA HEAD 1.  Diminutive enhancement of the right V4  segment with intermittent occlusion, and multifocal severe stenosis/near occlusion of the left V4 segment, similar to the prior CTA. 2. Additional intracranial atherosclerotic disease as detailed above resulting in moderate stenosis of the left cavernous ICA, mild-to-moderate stenosis of the right supraclinoid ICA, severe stenosis of the origin of the left superior M2 division, severe stenosis of the distal right M1 segment, and severe stenosis of the proximal left P1 segment. MRA NECK 1. 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 2. Moderate stenosis of the origin of the left vertebral artery, not significantly changed. 3. Patent carotid systems bilaterally. Electronically Signed   By: Valetta Mole M.D.   On: 05/30/2022 12:01   MR BRAIN WO CONTRAST  Result Date: 05/30/2022 CLINICAL DATA:  History of non-Hodgkin's lymphoma, hypertension, Guillain-Barre syndrome, and multiple strokes. Complains of dizziness and overall weakness. Possible CVA. EXAM: MRI HEAD WITHOUT CONTRAST MRA HEAD WITHOUT CONTRAST MRA NECK WITHOUT AND WITH CONTRAST TECHNIQUE: Multiplanar, multi-echo pulse sequences of the brain and surrounding structures were acquired without intravenous contrast. Angiographic images of the Circle of Willis were acquired using MRA technique without intravenous contrast. Angiographic images of the neck were acquired using MRA technique without and with intravenous contrast. Carotid stenosis measurements (when applicable) are obtained utilizing NASCET criteria, using the distal internal carotid diameter as the denominator. CONTRAST:  35m GADAVIST GADOBUTROL 1 MMOL/ML IV SOLN COMPARISON:  None Available. CT head 1 day prior, CTA head/neck 09/15/2021 FINDINGS: MRI HEAD FINDINGS Brain: There is diffusion restriction in the right aspect of the medulla with faint associated FLAIR signal abnormality consistent with acute infarct (5-9).  There is no associated hemorrhage or mass effect. There is no other evidence of acute infarct. There is no acute intracranial hemorrhage or extra-axial fluid collection. Parenchymal volume is normal. The ventricles are normal in size. Scattered foci of FLAIR signal abnormality in the supratentorial white matter are nonspecific but likely reflects sequela of underlying chronic small-vessel ischemic change, similar to the MRI from 2023. There are small remote infarcts in the bilateral cerebellar hemispheres, at least 1 of which in the left cerebellar hemisphere is new since 09/15/2021. There are punctate chronic microhemorrhages in the right thalamus and right parietal white matter, nonspecific but possibly hypertensive. The pituitary and suprasellar region are normal. There is no mass lesion. There is no mass effect or midline shift. Vascular: See below. Skull and upper cervical spine: Normal marrow signal. Sinuses/Orbits: The paranasal sinuses are clear. The globes and orbits are unremarkable. Other: There are prominent lymph nodes in the imaged upper neck on the coronal diffusion sequence which appear overall decreased in size since the prior MRI from 2023 though are incompletely evaluated. MRA HEAD FINDINGS Anterior circulation: There is atherosclerotic irregularity of the carotid siphons resulting in moderate stenosis of the cavernous segment on the left and mild-to-moderate stenosis of the supraclinoid segment on the right. The left M1 segment is patent. There is moderate to severe stenosis of the origin of the superior left M2 division (7-156). The distal left MCA branches appear patent. There is severe stenosis of the distal right M1 segment (1036-3). There is multifocal moderate narrowing of the proximal right superior M2 division. The distal right MCA branches otherwise appear patent. The bilateral ACAs are patent, without proximal stenosis or occlusion. The anterior communicating artery is not definitely  identified. There is no aneurysm or AVM. Posterior circulation: There is no flow related enhancement of the right V4 segment on the time-of-flight MRA head.  There is intermittent enhancement on the postcontrast MRA neck images. Findings are similar to the CTA from 2023. The PICA origin is not seen on the right. There is multifocal severe stenosis/near occlusion of the left V4 segment, also similar to the prior CT. PICA is identified on the left. The basilar artery is patent with atherosclerotic irregularity and narrowing. There is severe stenosis of the origin of the left P1 segment. There is multifocal atherosclerotic irregularity and narrowing in the remainder of the left PCA without other high-grade stenosis or occlusion. The right PCA is patent with mild atherosclerotic irregularity but no high-grade stenosis or occlusion. Findings are similar to the prior CTA. There is no aneurysm or AVM. Anatomic variants: None. MRA NECK FINDINGS Aortic arch: The imaged aortic arch is normal. The origins of the major branch vessels are patent. The subclavian arteries are patent to the level imaged. 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 Right carotid system: The right common, internal, and external carotid arteries are patent, with mild irregularity at the bifurcation but no hemodynamically significant stenosis or occlusion. There is no evidence of dissection or aneurysm. Left carotid system: The left common, internal, and external carotid arteries are patent, without significant stenosis or occlusion. There is no evidence of dissection or aneurysm. Vertebral arteries: The right vertebral artery appears patent at its origin. There is short-segment high-grade stenosis/occlusion of the distal V2 segment (15-60). There is diminutive reconstitution with further occlusion of the V3 segment. There is moderate stenosis of the left vertebral artery origin (15-52). The left vertebral artery is otherwise patent  in the neck without other hemodynamically significant stenosis or occlusion. There is no evidence of dissection or aneurysm. There is antegrade flow in both vertebral arteries. Other: None IMPRESSION: MR HEAD 1. Small acute infarct in the right aspect of the medulla without hemorrhage or mass effect. 2. Background chronic small-vessel ischemic change with small remote infarcts in the bilateral cerebellar hemispheres, one of which on the left is new since 09/15/2021. 3. Prominent lymph nodes in the upper neck consistent with the history of lymphoma, overall decreased in size since the prior studies from 09/15/2021 but incompletely evaluated. MRA HEAD 1. Diminutive enhancement of the right V4 segment with intermittent occlusion, and multifocal severe stenosis/near occlusion of the left V4 segment, similar to the prior CTA. 2. Additional intracranial atherosclerotic disease as detailed above resulting in moderate stenosis of the left cavernous ICA, mild-to-moderate stenosis of the right supraclinoid ICA, severe stenosis of the origin of the left superior M2 division, severe stenosis of the distal right M1 segment, and severe stenosis of the proximal left P1 segment. MRA NECK 1. 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 2. Moderate stenosis of the origin of the left vertebral artery, not significantly changed. 3. Patent carotid systems bilaterally. Electronically Signed   By: Valetta Mole M.D.   On: 05/30/2022 12:01   MR ANGIO NECK W WO CONTRAST  Result Date: 05/30/2022 CLINICAL DATA:  History of non-Hodgkin's lymphoma, hypertension, Guillain-Barre syndrome, and multiple strokes. Complains of dizziness and overall weakness. Possible CVA. EXAM: MRI HEAD WITHOUT CONTRAST MRA HEAD WITHOUT CONTRAST MRA NECK WITHOUT AND WITH CONTRAST TECHNIQUE: Multiplanar, multi-echo pulse sequences of the brain and surrounding structures were acquired without intravenous contrast. Angiographic  images of the Circle of Willis were acquired using MRA technique without intravenous contrast. Angiographic images of the neck were acquired using MRA technique without and with intravenous contrast. Carotid stenosis  measurements (when applicable) are obtained utilizing NASCET criteria, using the distal internal carotid diameter as the denominator. CONTRAST:  13m GADAVIST GADOBUTROL 1 MMOL/ML IV SOLN COMPARISON:  None Available. CT head 1 day prior, CTA head/neck 09/15/2021 FINDINGS: MRI HEAD FINDINGS Brain: There is diffusion restriction in the right aspect of the medulla with faint associated FLAIR signal abnormality consistent with acute infarct (5-9). There is no associated hemorrhage or mass effect. There is no other evidence of acute infarct. There is no acute intracranial hemorrhage or extra-axial fluid collection. Parenchymal volume is normal. The ventricles are normal in size. Scattered foci of FLAIR signal abnormality in the supratentorial white matter are nonspecific but likely reflects sequela of underlying chronic small-vessel ischemic change, similar to the MRI from 2023. There are small remote infarcts in the bilateral cerebellar hemispheres, at least 1 of which in the left cerebellar hemisphere is new since 09/15/2021. There are punctate chronic microhemorrhages in the right thalamus and right parietal white matter, nonspecific but possibly hypertensive. The pituitary and suprasellar region are normal. There is no mass lesion. There is no mass effect or midline shift. Vascular: See below. Skull and upper cervical spine: Normal marrow signal. Sinuses/Orbits: The paranasal sinuses are clear. The globes and orbits are unremarkable. Other: There are prominent lymph nodes in the imaged upper neck on the coronal diffusion sequence which appear overall decreased in size since the prior MRI from 2023 though are incompletely evaluated. MRA HEAD FINDINGS Anterior circulation: There is atherosclerotic  irregularity of the carotid siphons resulting in moderate stenosis of the cavernous segment on the left and mild-to-moderate stenosis of the supraclinoid segment on the right. The left M1 segment is patent. There is moderate to severe stenosis of the origin of the superior left M2 division (7-156). The distal left MCA branches appear patent. There is severe stenosis of the distal right M1 segment (1036-3). There is multifocal moderate narrowing of the proximal right superior M2 division. The distal right MCA branches otherwise appear patent. The bilateral ACAs are patent, without proximal stenosis or occlusion. The anterior communicating artery is not definitely identified. There is no aneurysm or AVM. Posterior circulation: There is no flow related enhancement of the right V4 segment on the time-of-flight MRA head. There is intermittent enhancement on the postcontrast MRA neck images. Findings are similar to the CTA from 2023. The PICA origin is not seen on the right. There is multifocal severe stenosis/near occlusion of the left V4 segment, also similar to the prior CT. PICA is identified on the left. The basilar artery is patent with atherosclerotic irregularity and narrowing. There is severe stenosis of the origin of the left P1 segment. There is multifocal atherosclerotic irregularity and narrowing in the remainder of the left PCA without other high-grade stenosis or occlusion. The right PCA is patent with mild atherosclerotic irregularity but no high-grade stenosis or occlusion. Findings are similar to the prior CTA. There is no aneurysm or AVM. Anatomic variants: None. MRA NECK FINDINGS Aortic arch: The imaged aortic arch is normal. The origins of the major branch vessels are patent. The subclavian arteries are patent to the level imaged. 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 Right carotid system: The right common, internal, and external carotid arteries are patent, with mild  irregularity at the bifurcation but no hemodynamically significant stenosis or occlusion. There is no evidence of dissection or aneurysm. Left carotid system: The left common, internal, and external carotid arteries are patent, without significant stenosis or occlusion. There  is no evidence of dissection or aneurysm. Vertebral arteries: The right vertebral artery appears patent at its origin. There is short-segment high-grade stenosis/occlusion of the distal V2 segment (15-60). There is diminutive reconstitution with further occlusion of the V3 segment. There is moderate stenosis of the left vertebral artery origin (15-52). The left vertebral artery is otherwise patent in the neck without other hemodynamically significant stenosis or occlusion. There is no evidence of dissection or aneurysm. There is antegrade flow in both vertebral arteries. Other: None IMPRESSION: MR HEAD 1. Small acute infarct in the right aspect of the medulla without hemorrhage or mass effect. 2. Background chronic small-vessel ischemic change with small remote infarcts in the bilateral cerebellar hemispheres, one of which on the left is new since 09/15/2021. 3. Prominent lymph nodes in the upper neck consistent with the history of lymphoma, overall decreased in size since the prior studies from 09/15/2021 but incompletely evaluated. MRA HEAD 1. Diminutive enhancement of the right V4 segment with intermittent occlusion, and multifocal severe stenosis/near occlusion of the left V4 segment, similar to the prior CTA. 2. Additional intracranial atherosclerotic disease as detailed above resulting in moderate stenosis of the left cavernous ICA, mild-to-moderate stenosis of the right supraclinoid ICA, severe stenosis of the origin of the left superior M2 division, severe stenosis of the distal right M1 segment, and severe stenosis of the proximal left P1 segment. MRA NECK 1. 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 2. Moderate stenosis of the origin of the left vertebral artery, not significantly changed. 3. Patent carotid systems bilaterally. Electronically Signed   By: Valetta Mole M.D.   On: 05/30/2022 12:01   CT Angio Chest/Abd/Pel for Dissection W and/or W/WO  Result Date: 05/29/2022 CLINICAL DATA:  numbness, tingling ,dizziness, hypertensive Radiologic records indicates history of CLL. EXAM: CT ANGIOGRAPHY CHEST, ABDOMEN AND PELVIS TECHNIQUE: Non-contrast CT of the chest was initially obtained. Multidetector CT imaging through the chest, abdomen and pelvis was performed using the standard protocol during bolus administration of intravenous contrast. Multiplanar reconstructed images and MIPs were obtained and reviewed to evaluate the vascular anatomy. RADIATION DOSE REDUCTION: This exam was performed according to the departmental dose-optimization program which includes automated exposure control, adjustment of the mA and/or kV according to patient size and/or use of iterative reconstruction technique. CONTRAST:  39m OMNIPAQUE IOHEXOL 350 MG/ML SOLN COMPARISON:  Chest radiograph earlier today. Chest, abdomen, pelvis CT 10/15/2019 FINDINGS: CTA CHEST FINDINGS Cardiovascular: No aortic hematoma on this unenhanced exam. Thoracic aorta is normal in caliber with mild tortuosity. Mild aortic atherosclerosis. No dissection, aneurysm or acute aortic findings. Aberrant right subclavian artery. No central pulmonary embolus on this exam not tailored to pulmonary artery assessment. Borderline cardiomegaly. Minimal pericardial effusion. Mediastinum/Nodes: No enlarged mediastinal or hilar lymph nodes. There is bulky bilateral axillary adenopathy. Index right axillary node measures 2.1 cm short axis series 4, image 21. Left axillary node measures 2.5 cm short axis series 4, image 33. 11 mm left supraclavicular node is stable. Similar appearance of heterogeneous right lobe of the thyroid. No esophageal wall thickening.  Lungs/Pleura: Minor atelectasis in the lingula. No other focal airspace disease. No pleural fluid. No features of pulmonary edema. No pulmonary mass or nodule. The trachea and central airways are patent. Musculoskeletal: Heterogeneous appearance of the osseous structures but no discrete focal lesion. No acute osseous findings. Review of the MIP images confirms the above findings. CTA ABDOMEN AND PELVIS FINDINGS VASCULAR Aorta: Moderate atherosclerosis. Normal in caliber  without aneurysm, dissection, vasculitis or significant stenosis. Celiac: Mild plaque at the origin with minimal stenosis. Branch vessels are patent. No dissection or acute findings. SMA: Patent without evidence of aneurysm, dissection, vasculitis or significant stenosis. Renals: Single bilateral renal arteries. Mild calcified plaque at the origin of the both renal arteries with minimal stenosis. No dissection or acute findings. IMA: Patent without evidence of aneurysm, dissection, vasculitis or significant stenosis. Inflow: Moderate atherosclerosis. No aneurysm, dissection or acute findings. Veins: No obvious venous abnormality within the limitations of this arterial phase study. Review of the MIP images confirms the above findings. NON-VASCULAR Hepatobiliary: No focal hepatic abnormality on this arterial phase exam. Or gallbladder Pancreas: No ductal dilatation or inflammation. Spleen: Normal in size and arterial enhancement. Adrenals/Urinary Tract: No adrenal nodule. Heterogeneous enhancement of both kidneys with mild perinephric edema. Loss of corticomedullary differentiation on the right. No frank hydronephrosis. 4.3 cm mildly complex cyst arising from the lower pole of the left kidney, suboptimally assessed on this arterial phase exam. Thin internal septations are seen. Mild bladder distension but no wall thickening. Stomach/Bowel: No bowel obstruction or inflammation. Scattered colonic diverticula without diverticulitis. Normal appendix.  Lymphatic: Retroperitoneal adenopathy with progression from prior exam. Dominant node is at the right common iliac station measuring 2.9 cm short axis series 4, image 143. This node previously measured 2.5 cm. Lymph node at the level of the upper right kidney measures 18 mm short axis series 4, image 88. Anterior periaortic node measures 2.1 cm series 4, image 103. There also enlarged peripancreatic and periportal nodes. Mesenteric adenopathy is suboptimally assessed on this arterial phase exam, but mesenteric lymph nodes have increased in size from prior. There also ileocolic nodes are enlarged. Bilateral pelvic adenopathy, 17 mm left external iliac node series 4, image 167, 14 mm right external iliac node series 4, image 166. Reproductive: Status post hysterectomy. No adnexal masses. Other: No ascites or free air. Small fat containing umbilical hernia. Musculoskeletal: The bones are diffusely heterogeneous. No acute osseous finding. No discrete osseous lesion. Bilateral hip osteoarthritis. Review of the MIP images confirms the above findings. IMPRESSION: 1. No aortic dissection or acute aortic abnormality. Moderate aortic atherosclerosis. 2. Bulky bilateral axillary, mesenteric, and retroperitoneal adenopathy, consistent with history of CLL. Adenopathy is progressed from 2021 exam. 3. Heterogeneous enhancement of both kidneys with mild perinephric edema, suspicious for pyelonephritis. Recommend correlation with urinalysis. 4. Colonic diverticulosis without diverticulitis. Aortic Atherosclerosis (ICD10-I70.0). Electronically Signed   By: Keith Rake M.D.   On: 05/29/2022 15:17   DG Chest 2 View  Result Date: 05/29/2022 CLINICAL DATA:  Dizziness. EXAM: CHEST - 2 VIEW COMPARISON:  Chest CT dated 10/15/2019 FINDINGS: Stable enlarged heart and tortuous and calcified thoracic aorta. Mild linear scarring in the lingula without significant change. Otherwise, clear lungs with normal vascularity. Mild thoracic  spine degenerative changes. IMPRESSION: No acute abnormality. Stable cardiomegaly. Electronically Signed   By: Claudie Revering M.D.   On: 05/29/2022 12:29   CT Head Wo Contrast  Result Date: 05/29/2022 CLINICAL DATA:  Dizziness.  Numbness.  TIAs. EXAM: CT HEAD WITHOUT CONTRAST TECHNIQUE: Contiguous axial images were obtained from the base of the skull through the vertex without intravenous contrast. RADIATION DOSE REDUCTION: This exam was performed according to the departmental dose-optimization program which includes automated exposure control, adjustment of the mA and/or kV according to patient size and/or use of iterative reconstruction technique. COMPARISON:  09/15/2021 FINDINGS: Brain: No evidence of intracranial hemorrhage, acute infarction, hydrocephalus, extra-axial collection, or mass lesion/mass effect.  Old lacunar infarct noted in left superior cerebellum. Vascular:  No hyperdense vessel or other acute findings. Skull: No evidence of fracture or other significant bone abnormality. Sinuses/Orbits:  No acute findings. Other: None. IMPRESSION: No acute intracranial abnormality. Electronically Signed   By: Marlaine Hind M.D.   On: 05/29/2022 12:23    DISCHARGE EXAMINATION: Vitals:   06/20/22 2001 06/20/22 2336 06/21/22 0403 06/21/22 0828  BP: (!) 157/119 (!) 151/69 136/72 (!) (P) 144/94  Pulse: 71 62 74 (P) 81  Resp: 18 18 17 $ (P) 18  Temp: 97.6 F (36.4 C) 98.6 F (37 C) 98 F (36.7 C) (P) 97.9 F (36.6 C)  TempSrc:    (P) Oral  SpO2: 98% 99% 95% (P) 99%  Weight:        General appearance: Awake alert.  In no distress Resp: Clear to auscultation bilaterally.  Normal effort Cardio: S1-S2 is normal regular.  No S3-S4.  No rubs murmurs or bruit GI: Abdomen is soft.  Nontender nondistended.  Bowel sounds are present normal.  No masses organomegaly    DISPOSITION: SNF  Discharge Instructions     Ambulatory referral to Neurology   Complete by: As directed    An appointment is  requested in approximately: 8 weeks   Call MD for:  difficulty breathing, headache or visual disturbances   Complete by: As directed    Call MD for:  extreme fatigue   Complete by: As directed    Call MD for:  persistant dizziness or light-headedness   Complete by: As directed    Call MD for:  persistant nausea and vomiting   Complete by: As directed    Call MD for:  severe uncontrolled pain   Complete by: As directed    Call MD for:  temperature >100.4   Complete by: As directed    Discharge instructions   Complete by: As directed    Please review instructions on the discharge summary.  You were cared for by a hospitalist during your hospital stay. If you have any questions about your discharge medications or the care you received while you were in the hospital after you are discharged, you can call the unit and asked to speak with the hospitalist on call if the hospitalist that took care of you is not available. Once you are discharged, your primary care physician will handle any further medical issues. Please note that NO REFILLS for any discharge medications will be authorized once you are discharged, as it is imperative that you return to your primary care physician (or establish a relationship with a primary care physician if you do not have one) for your aftercare needs so that they can reassess your need for medications and monitor your lab values. If you do not have a primary care physician, you can call [phone] for a physician referral.   Increase activity slowly   Complete by: As directed           Allergies as of 06/21/2022   No Known Allergies      Medication List     STOP taking these medications    cilostazol 50 MG tablet Commonly known as: PLETAL   clopidogrel 75 MG tablet Commonly known as: PLAVIX   insulin glargine-yfgn 100 UNIT/ML injection Commonly known as: SEMGLEE   insulin lispro 100 UNIT/ML KwikPen Commonly known as: HUMALOG    lisinopril-hydrochlorothiazide 20-12.5 MG tablet Commonly known as: ZESTORETIC       TAKE these medications    amLODipine  10 MG tablet Commonly known as: NORVASC Take 1 tablet by mouth once daily   aspirin EC 81 MG tablet Take 1 tablet (81 mg total) by mouth daily. Swallow whole.   atorvastatin 80 MG tablet Commonly known as: Lipitor Take 1 tablet (80 mg total) by mouth daily.   Blood Pressure Monitor Automat Devi 1 Device by Does not apply route daily.   ezetimibe 10 MG tablet Commonly known as: ZETIA Take 1 tablet (10 mg total) by mouth daily.   FLUoxetine 20 MG capsule Commonly known as: PROZAC Take 1 capsule by mouth once daily   glucose blood test strip Commonly known as: OneTouch Verio USE TO CHECK BLOOD SUGAR TWICE A DAY AND PRN   glucose blood test strip Commonly known as: Contour Next Test 1 each by Other route 2 (two) times daily. And lancets 2/day   hydrALAZINE 50 MG tablet Commonly known as: APRESOLINE TAKE 1 TABLET BY MOUTH THREE TIMES DAILY   insulin aspart 100 UNIT/ML injection Commonly known as: novoLOG Inject 0-15 Units into the skin 3 (three) times daily with meals. CBG < 70: Implement Hypoglycemia Standing Orders and refer to Hypoglycemia Standing Orders sidebar report CBG 70 - 120: 0 units CBG 121 - 150: 2 units CBG 151 - 200: 3 units CBG 201 - 250: 5 units CBG 251 - 300: 8 units CBG 301 - 350: 11 units CBG 351 - 400: 15 units   lidocaine 5 % Commonly known as: LIDODERM Place 1 patch onto the skin daily. Remove & Discard patch within 12 hours or as directed by MD   loperamide 2 MG capsule Commonly known as: IMODIUM Take 2 capsules (4 mg total) by mouth 3 (three) times daily as needed for diarrhea or loose stools.   ONE TOUCH LANCETS Misc USE TO CHECK BLOOD SUGAR TWICE A DAY AND PRN   pantoprazole 40 MG tablet Commonly known as: PROTONIX Take 1 tablet (40 mg total) by mouth daily.   saccharomyces boulardii 250 MG capsule Commonly  known as: FLORASTOR Take 1 capsule (250 mg total) by mouth 2 (two) times daily.   ticagrelor 90 MG Tabs tablet Commonly known as: BRILINTA Take 1 tablet (90 mg total) by mouth 2 (two) times daily.   Trulicity [id number] Sopn Generic drug: Dulaglutide Inject 0.75 mg into the skin once a week.   Vitamin B Complex Tabs Take 1 tablet by mouth every morning.   VITAMIN D3 PO Take 1 capsule by mouth every morning.          Contact information for follow-up providers     [name], [name], MD. Schedule an appointment as soon as possible for a visit.   Specialty: Family Medicine Why: post hospitalization follow up Contact information: [address] [address] [phone]              Contact information for after-discharge care     Destination     [facility] Preferred SNF .   Service: Skilled Chiropodist information: [place] [phone]                     TOTAL DISCHARGE TIME: 35 mins  [facility] Pager on [email or web address]  06/21/2022, 8:58 AM

## 2022-06-21 NOTE — Plan of Care (Signed)
Report was called by [facility] to receiving RN at [facility]. Problem: Education: Goal: Knowledge of General Education information will improve Description: Including pain rating scale, medication(s)/side effects and non-pharmacologic comfort measures Outcome: Adequate for Discharge   Problem: Health Behavior/Discharge Planning: Goal: Ability to manage health-related needs will improve Outcome: Adequate for Discharge   Problem: Clinical Measurements: Goal: Ability to maintain clinical measurements within normal limits will improve Outcome: Adequate for Discharge Goal: Will remain free from infection Outcome: Adequate for Discharge Goal: Diagnostic test results will improve Outcome: Adequate for Discharge Goal: Respiratory complications will improve Outcome: Adequate for Discharge Goal: Cardiovascular complication will be avoided Outcome: Adequate for Discharge   Problem: Activity: Goal: Risk for activity intolerance will decrease Outcome: Adequate for Discharge   Problem: Nutrition: Goal: Adequate nutrition will be maintained Outcome: Adequate for Discharge   Problem: Coping: Goal: Level of anxiety will decrease Outcome: Adequate for Discharge   Problem: Elimination: Goal: Will not experience complications related to bowel motility Outcome: Adequate for Discharge Goal: Will not experience complications related to urinary retention Outcome: Adequate for Discharge   Problem: Pain Managment: Goal: General experience of comfort will improve Outcome: Adequate for Discharge   Problem: Safety: Goal: Ability to remain free from injury will improve Outcome: Adequate for Discharge   Problem: Skin Integrity: Goal: Risk for impaired skin integrity will decrease Outcome: Adequate for Discharge   Problem: Education: Goal: Ability to describe self-care measures that may prevent or decrease complications (Diabetes Survival Skills Education) will improve Outcome: Adequate for  Discharge Goal: Individualized Educational Video(s) Outcome: Adequate for Discharge   Problem: Coping: Goal: Ability to adjust to condition or change in health will improve Outcome: Adequate for Discharge   Problem: Fluid Volume: Goal: Ability to maintain a balanced intake and output will improve Outcome: Adequate for Discharge   Problem: Health Behavior/Discharge Planning: Goal: Ability to identify and utilize available resources and services will improve Outcome: Adequate for Discharge Goal: Ability to manage health-related needs will improve Outcome: Adequate for Discharge   Problem: Metabolic: Goal: Ability to maintain appropriate glucose levels will improve Outcome: Adequate for Discharge   Problem: Nutritional: Goal: Maintenance of adequate nutrition will improve Outcome: Adequate for Discharge Goal: Progress toward achieving an optimal weight will improve Outcome: Adequate for Discharge   Problem: Skin Integrity: Goal: Risk for impaired skin integrity will decrease Outcome: Adequate for Discharge   Problem: Tissue Perfusion: Goal: Adequacy of tissue perfusion will improve Outcome: Adequate for Discharge   Problem: Education: Goal: Knowledge of disease or condition will improve Outcome: Adequate for Discharge Goal: Knowledge of secondary prevention will improve (MUST DOCUMENT ALL) Outcome: Adequate for Discharge Goal: Knowledge of patient specific risk factors will improve Elta [place] N/A or DELETE if not current risk factor) Outcome: Adequate for Discharge   Problem: Ischemic Stroke/TIA Tissue Perfusion: Goal: Complications of ischemic stroke/TIA will be minimized Outcome: Adequate for Discharge   Problem: Coping: Goal: Will verbalize positive feelings about self Outcome: Adequate for Discharge Goal: Will identify appropriate support needs Outcome: Adequate for Discharge   Problem: Health Behavior/Discharge Planning: Goal: Ability to manage health-related needs  will improve Outcome: Adequate for Discharge Goal: Goals will be collaboratively established with patient/family Outcome: Adequate for Discharge   Problem: Self-Care: Goal: Ability to participate in self-care as condition permits will improve Outcome: Adequate for Discharge Goal: Verbalization of feelings and concerns over difficulty with self-care will improve Outcome: Adequate for Discharge Goal: Ability to communicate needs accurately will improve Outcome: Adequate for Discharge   Problem:  Nutrition: Goal: Risk of aspiration will decrease Outcome: Adequate for Discharge Goal: Dietary intake will improve Outcome: Adequate for Discharge   Problem: Education: Goal: Knowledge of disease or condition will improve Outcome: Adequate for Discharge Goal: Knowledge of secondary prevention will improve (MUST DOCUMENT ALL) Outcome: Adequate for Discharge Goal: Knowledge of patient specific risk factors will improve El[place] N/A or DELETE if not current risk factor) Outcome: Adequate for Discharge   Problem: Ischemic Stroke/TIA Tissue Perfusion: Goal: Complications of ischemic stroke/TIA will be minimized Outcome: Adequate for Discharge   Problem: Coping: Goal: Will verbalize positive feelings about self Outcome: Adequate for Discharge Goal: Will identify appropriate support needs Outcome: Adequate for Discharge   Problem: Health Behavior/Discharge Planning: Goal: Ability to manage health-related needs will improve Outcome: Adequate for Discharge Goal: Goals will be collaboratively established with patient/family Outcome: Adequate for Discharge   Problem: Self-Care: Goal: Ability to participate in self-care as condition permits will improve Outcome: Adequate for Discharge Goal: Verbalization of feelings and concerns over difficulty with self-care will improve Outcome: Adequate for Discharge Goal: Ability to communicate needs accurately will improve Outcome: Adequate for  Discharge   Problem: Nutrition: Goal: Risk of aspiration will decrease Outcome: Adequate for Discharge Goal: Dietary intake will improve Outcome: Adequate for Discharge   Problem: Acute Rehab PT Goals(only PT should resolve) Goal: Pt will Roll Supine to Side Outcome: Adequate for Discharge Goal: Pt Will Go Supine/Side To Sit Outcome: Adequate for Discharge Goal: Pt Will Go Sit To Supine/Side Outcome: Adequate for Discharge Goal: Patient Will Perform Sitting Balance Outcome: Adequate for Discharge Goal: Pt Will Transfer Bed To Chair/Chair To Bed Outcome: Adequate for Discharge   Problem: Acute Rehab OT Goals (only OT should resolve) Goal: Pt. Will Perform Lower Body Bathing Outcome: Adequate for Discharge Goal: Pt. Will Perform Lower Body Dressing Outcome: Adequate for Discharge Goal: Pt. Will Transfer To Toilet Outcome: Adequate for Discharge Goal: Pt. Will Perform Toileting-Clothing Manipulation Outcome: Adequate for Discharge Goal: OT Additional ADL Goal #1 Outcome: Adequate for Discharge   Problem: SLP Cognition Goals Goal: Patient will utilize external memory aids Description: Patient will utilize external memory aids to facilitate recall of information for improved safety with Outcome: Adequate for Discharge   Problem: SLP Language Goals Goal: Patient will utilize speech intelligibility Description: Patient will utilize speech intelligibility strategies to  enhance communication with Outcome: Adequate for Discharge

## 2022-06-29 ENCOUNTER — Telehealth: Payer: Self-pay

## 2022-06-29 NOTE — Telephone Encounter (Signed)
I called and left patient's sister a voicemail letting her know that patient's medication came in for pick up. I also spoke with patient and she will let her know it is ready for pick up.

## 2022-06-29 NOTE — Telephone Encounter (Signed)
Pt sister is aware of the below message

## 2022-07-06 ENCOUNTER — Other Ambulatory Visit: Payer: Self-pay

## 2022-07-06 ENCOUNTER — Inpatient Hospital Stay (HOSPITAL_COMMUNITY)
Admission: EM | Admit: 2022-07-06 | Discharge: 2022-07-11 | DRG: 394 | Disposition: A | Discharge status: Skilled Nursing Facility | Payer: Commercial Managed Care - HMO | Source: Skilled Nursing Facility | Attending: Internal Medicine | Admitting: Internal Medicine

## 2022-07-06 ENCOUNTER — Encounter (HOSPITAL_COMMUNITY): Payer: Self-pay | Admitting: Emergency Medicine

## 2022-07-06 DIAGNOSIS — K922 Gastrointestinal hemorrhage, unspecified: Principal | ICD-10-CM | POA: Diagnosis present

## 2022-07-06 DIAGNOSIS — Z8249 Family history of ischemic heart disease and other diseases of the circulatory system: Secondary | ICD-10-CM

## 2022-07-06 DIAGNOSIS — R1310 Dysphagia, unspecified: Secondary | ICD-10-CM | POA: Diagnosis present

## 2022-07-06 DIAGNOSIS — Z833 Family history of diabetes mellitus: Secondary | ICD-10-CM

## 2022-07-06 DIAGNOSIS — E872 Acidosis, unspecified: Secondary | ICD-10-CM | POA: Diagnosis present

## 2022-07-06 DIAGNOSIS — Z7985 Long-term (current) use of injectable non-insulin antidiabetic drugs: Secondary | ICD-10-CM | POA: Diagnosis not present

## 2022-07-06 DIAGNOSIS — K633 Ulcer of intestine: Secondary | ICD-10-CM

## 2022-07-06 DIAGNOSIS — E876 Hypokalemia: Secondary | ICD-10-CM | POA: Diagnosis present

## 2022-07-06 DIAGNOSIS — D573 Sickle-cell trait: Secondary | ICD-10-CM | POA: Diagnosis present

## 2022-07-06 DIAGNOSIS — I129 Hypertensive chronic kidney disease with stage 1 through stage 4 chronic kidney disease, or unspecified chronic kidney disease: Secondary | ICD-10-CM | POA: Diagnosis present

## 2022-07-06 DIAGNOSIS — K625 Hemorrhage of anus and rectum: Secondary | ICD-10-CM | POA: Diagnosis not present

## 2022-07-06 DIAGNOSIS — E6609 Other obesity due to excess calories: Secondary | ICD-10-CM | POA: Diagnosis present

## 2022-07-06 DIAGNOSIS — E1122 Type 2 diabetes mellitus with diabetic chronic kidney disease: Secondary | ICD-10-CM | POA: Diagnosis present

## 2022-07-06 DIAGNOSIS — Z79899 Other long term (current) drug therapy: Secondary | ICD-10-CM | POA: Diagnosis not present

## 2022-07-06 DIAGNOSIS — K648 Other hemorrhoids: Secondary | ICD-10-CM | POA: Diagnosis present

## 2022-07-06 DIAGNOSIS — Z7902 Long term (current) use of antithrombotics/antiplatelets: Secondary | ICD-10-CM

## 2022-07-06 DIAGNOSIS — F419 Anxiety disorder, unspecified: Secondary | ICD-10-CM | POA: Diagnosis present

## 2022-07-06 DIAGNOSIS — Z7984 Long term (current) use of oral hypoglycemic drugs: Secondary | ICD-10-CM | POA: Diagnosis not present

## 2022-07-06 DIAGNOSIS — Z7982 Long term (current) use of aspirin: Secondary | ICD-10-CM | POA: Diagnosis not present

## 2022-07-06 DIAGNOSIS — E785 Hyperlipidemia, unspecified: Secondary | ICD-10-CM | POA: Diagnosis present

## 2022-07-06 DIAGNOSIS — Z8572 Personal history of non-Hodgkin lymphomas: Secondary | ICD-10-CM | POA: Diagnosis not present

## 2022-07-06 DIAGNOSIS — D631 Anemia in chronic kidney disease: Secondary | ICD-10-CM | POA: Diagnosis present

## 2022-07-06 DIAGNOSIS — K529 Noninfective gastroenteritis and colitis, unspecified: Secondary | ICD-10-CM | POA: Diagnosis present

## 2022-07-06 DIAGNOSIS — Z823 Family history of stroke: Secondary | ICD-10-CM

## 2022-07-06 DIAGNOSIS — Z794 Long term (current) use of insulin: Secondary | ICD-10-CM | POA: Diagnosis not present

## 2022-07-06 DIAGNOSIS — D122 Benign neoplasm of ascending colon: Secondary | ICD-10-CM | POA: Diagnosis present

## 2022-07-06 DIAGNOSIS — E1165 Type 2 diabetes mellitus with hyperglycemia: Secondary | ICD-10-CM | POA: Diagnosis present

## 2022-07-06 DIAGNOSIS — Z90710 Acquired absence of both cervix and uterus: Secondary | ICD-10-CM

## 2022-07-06 DIAGNOSIS — E1151 Type 2 diabetes mellitus with diabetic peripheral angiopathy without gangrene: Secondary | ICD-10-CM | POA: Diagnosis present

## 2022-07-06 DIAGNOSIS — K626 Ulcer of anus and rectum: Secondary | ICD-10-CM | POA: Diagnosis present

## 2022-07-06 DIAGNOSIS — D649 Anemia, unspecified: Secondary | ICD-10-CM | POA: Diagnosis not present

## 2022-07-06 DIAGNOSIS — C8590 Non-Hodgkin lymphoma, unspecified, unspecified site: Secondary | ICD-10-CM | POA: Diagnosis not present

## 2022-07-06 DIAGNOSIS — I69322 Dysarthria following cerebral infarction: Secondary | ICD-10-CM

## 2022-07-06 DIAGNOSIS — F32A Depression, unspecified: Secondary | ICD-10-CM | POA: Diagnosis present

## 2022-07-06 DIAGNOSIS — K921 Melena: Secondary | ICD-10-CM | POA: Diagnosis not present

## 2022-07-06 DIAGNOSIS — Z856 Personal history of leukemia: Secondary | ICD-10-CM

## 2022-07-06 DIAGNOSIS — N1831 Chronic kidney disease, stage 3a: Secondary | ICD-10-CM | POA: Diagnosis present

## 2022-07-06 DIAGNOSIS — Z825 Family history of asthma and other chronic lower respiratory diseases: Secondary | ICD-10-CM

## 2022-07-06 DIAGNOSIS — I10 Essential (primary) hypertension: Secondary | ICD-10-CM | POA: Diagnosis not present

## 2022-07-06 DIAGNOSIS — D124 Benign neoplasm of descending colon: Secondary | ICD-10-CM | POA: Diagnosis not present

## 2022-07-06 DIAGNOSIS — I69354 Hemiplegia and hemiparesis following cerebral infarction affecting left non-dominant side: Secondary | ICD-10-CM

## 2022-07-06 DIAGNOSIS — R197 Diarrhea, unspecified: Secondary | ICD-10-CM | POA: Diagnosis not present

## 2022-07-06 DIAGNOSIS — I739 Peripheral vascular disease, unspecified: Secondary | ICD-10-CM | POA: Diagnosis not present

## 2022-07-06 DIAGNOSIS — I639 Cerebral infarction, unspecified: Secondary | ICD-10-CM | POA: Diagnosis not present

## 2022-07-06 DIAGNOSIS — Z6831 Body mass index (BMI) 31.0-31.9, adult: Secondary | ICD-10-CM

## 2022-07-06 DIAGNOSIS — Q438 Other specified congenital malformations of intestine: Secondary | ICD-10-CM | POA: Diagnosis not present

## 2022-07-06 DIAGNOSIS — R599 Enlarged lymph nodes, unspecified: Secondary | ICD-10-CM | POA: Diagnosis not present

## 2022-07-06 DIAGNOSIS — G610 Guillain-Barre syndrome: Secondary | ICD-10-CM | POA: Diagnosis not present

## 2022-07-06 DIAGNOSIS — K219 Gastro-esophageal reflux disease without esophagitis: Secondary | ICD-10-CM | POA: Diagnosis present

## 2022-07-06 LAB — CBC WITH DIFFERENTIAL/PLATELET
Abs Immature Granulocytes: 0.02 10*3/uL (ref 0.00–0.07)
Basophils Absolute: 0.1 10*3/uL (ref 0.0–0.1)
Basophils Relative: 1 %
Eosinophils Absolute: 0.2 10*3/uL (ref 0.0–0.5)
Eosinophils Relative: 2 %
HCT: 36.6 % (ref 36.0–46.0)
Hemoglobin: 11.8 g/dL — ABNORMAL LOW (ref 12.0–15.0)
Immature Granulocytes: 0 %
Lymphocytes Relative: 38 %
Lymphs Abs: 3.1 10*3/uL (ref 0.7–4.0)
MCH: 25 pg — ABNORMAL LOW (ref 26.0–34.0)
MCHC: 32.2 g/dL (ref 30.0–36.0)
MCV: 77.5 fL — ABNORMAL LOW (ref 80.0–100.0)
Monocytes Absolute: 1.2 10*3/uL — ABNORMAL HIGH (ref 0.1–1.0)
Monocytes Relative: 15 %
Neutro Abs: 3.6 10*3/uL (ref 1.7–7.7)
Neutrophils Relative %: 44 %
Platelets: 379 10*3/uL (ref 150–400)
RBC: 4.72 MIL/uL (ref 3.87–5.11)
RDW: 15.6 % — ABNORMAL HIGH (ref 11.5–15.5)
WBC: 8.1 10*3/uL (ref 4.0–10.5)
nRBC: 0 % (ref 0.0–0.2)

## 2022-07-06 LAB — TYPE AND SCREEN
ABO/RH(D): O POS
Antibody Screen: NEGATIVE

## 2022-07-06 LAB — HIV ANTIBODY (ROUTINE TESTING W REFLEX): HIV Screen 4th Generation wRfx: NONREACTIVE

## 2022-07-06 LAB — COMPREHENSIVE METABOLIC PANEL
ALT: 29 U/L (ref 0–44)
AST: 24 U/L (ref 15–41)
Albumin: 2.8 g/dL — ABNORMAL LOW (ref 3.5–5.0)
Alkaline Phosphatase: 89 U/L (ref 38–126)
Anion gap: 9 (ref 5–15)
BUN: 15 mg/dL (ref 8–23)
CO2: 21 mmol/L — ABNORMAL LOW (ref 22–32)
Calcium: 9.8 mg/dL (ref 8.9–10.3)
Chloride: 108 mmol/L (ref 98–111)
Creatinine, Ser: 0.89 mg/dL (ref 0.44–1.00)
GFR, Estimated: >60 mL/min (ref >60)
Glucose, Bld: 136 mg/dL — ABNORMAL HIGH (ref 70–99)
Potassium: 3.4 mmol/L — ABNORMAL LOW (ref 3.5–5.1)
Sodium: 138 mmol/L (ref 135–145)
Total Bilirubin: 0.7 mg/dL (ref 0.3–1.2)
Total Protein: 6.9 g/dL (ref 6.5–8.1)

## 2022-07-06 LAB — GLUCOSE, CAPILLARY: Glucose-Capillary: 135 mg/dL — ABNORMAL HIGH (ref 70–99)

## 2022-07-06 LAB — HEMOGLOBIN AND HEMATOCRIT, BLOOD
HCT: 34.6 % — ABNORMAL LOW (ref 36.0–46.0)
Hemoglobin: 11 g/dL — ABNORMAL LOW (ref 12.0–15.0)

## 2022-07-06 LAB — PROTIME-INR
INR: 1 (ref 0.8–1.2)
Prothrombin Time: 13.1 seconds (ref 11.4–15.2)

## 2022-07-06 MED ORDER — HYDRALAZINE HCL 10 MG PO TABS
10.0000 mg | ORAL_TABLET | Freq: Three times a day (TID) | ORAL | Status: DC
  Administered 2022-07-06 – 2022-07-11 (×15): 10 mg via ORAL
  Filled 2022-07-06 (×15): qty 1

## 2022-07-06 MED ORDER — PROCHLORPERAZINE EDISYLATE 10 MG/2ML IJ SOLN: 5.0000 mg | Freq: Four times a day (QID) | INTRAMUSCULAR | Status: DC | PRN

## 2022-07-06 MED ORDER — EZETIMIBE 10 MG PO TABS
10.0000 mg | ORAL_TABLET | Freq: Every day | ORAL | Status: DC
  Administered 2022-07-06 – 2022-07-11 (×6): 10 mg via ORAL
  Filled 2022-07-06 (×6): qty 1

## 2022-07-06 MED ORDER — SACCHAROMYCES BOULARDII 250 MG PO CAPS
250.0000 mg | ORAL_CAPSULE | Freq: Two times a day (BID) | ORAL | Status: DC
  Administered 2022-07-06 – 2022-07-11 (×10): 250 mg via ORAL
  Filled 2022-07-06 (×10): qty 1

## 2022-07-06 MED ORDER — INSULIN ASPART 100 UNIT/ML IJ SOLN
0.0000 [IU] | Freq: Every day | INTRAMUSCULAR | Status: DC
  Administered 2022-07-09 – 2022-07-10 (×2): 2 [IU] via SUBCUTANEOUS

## 2022-07-06 MED ORDER — POTASSIUM CHLORIDE CRYS ER 20 MEQ PO TBCR
40.0000 meq | EXTENDED_RELEASE_TABLET | Freq: Once | ORAL | Status: AC
  Administered 2022-07-06: 40 meq via ORAL
  Filled 2022-07-06: qty 2

## 2022-07-06 MED ORDER — LOPERAMIDE HCL 2 MG PO CAPS: 4.0000 mg | ORAL_CAPSULE | Freq: Three times a day (TID) | ORAL | Status: DC | PRN

## 2022-07-06 MED ORDER — PANTOPRAZOLE SODIUM 40 MG IV SOLR
40.0000 mg | Freq: Two times a day (BID) | INTRAVENOUS | Status: DC
  Administered 2022-07-06 – 2022-07-08 (×4): 40 mg via INTRAVENOUS
  Filled 2022-07-06 (×4): qty 10

## 2022-07-06 MED ORDER — SODIUM CHLORIDE 0.9 % IV SOLN: INTRAVENOUS | Status: DC

## 2022-07-06 MED ORDER — SODIUM CHLORIDE 0.9 % IV BOLUS
500.0000 mL | Freq: Once | INTRAVENOUS | Status: AC
  Administered 2022-07-06: 500 mL via INTRAVENOUS

## 2022-07-06 MED ORDER — ACETAMINOPHEN 325 MG PO TABS
650.0000 mg | ORAL_TABLET | Freq: Four times a day (QID) | ORAL | Status: DC | PRN
  Administered 2022-07-07 – 2022-07-10 (×2): 650 mg via ORAL
  Filled 2022-07-06 (×2): qty 2

## 2022-07-06 MED ORDER — MELATONIN 5 MG PO TABS: 5.0000 mg | ORAL_TABLET | Freq: Every evening | ORAL | Status: DC | PRN

## 2022-07-06 MED ORDER — INSULIN ASPART 100 UNIT/ML IJ SOLN
0.0000 [IU] | Freq: Three times a day (TID) | INTRAMUSCULAR | Status: DC
  Administered 2022-07-07: 2 [IU] via SUBCUTANEOUS
  Administered 2022-07-07 – 2022-07-08 (×2): 1 [IU] via SUBCUTANEOUS
  Administered 2022-07-09 – 2022-07-10 (×5): 2 [IU] via SUBCUTANEOUS
  Administered 2022-07-10 – 2022-07-11 (×2): 1 [IU] via SUBCUTANEOUS
  Administered 2022-07-11: 3 [IU] via SUBCUTANEOUS
  Administered 2022-07-11: 2 [IU] via SUBCUTANEOUS

## 2022-07-06 MED ORDER — FLUOXETINE HCL 20 MG PO CAPS
20.0000 mg | ORAL_CAPSULE | Freq: Every day | ORAL | Status: DC
  Administered 2022-07-06 – 2022-07-11 (×6): 20 mg via ORAL
  Filled 2022-07-06 (×6): qty 1

## 2022-07-06 MED ORDER — ATORVASTATIN CALCIUM 80 MG PO TABS
80.0000 mg | ORAL_TABLET | Freq: Every day | ORAL | Status: DC
  Administered 2022-07-06 – 2022-07-11 (×6): 80 mg via ORAL
  Filled 2022-07-06 (×6): qty 1

## 2022-07-06 NOTE — H&P (Signed)
History and Physical  [name] [id number] DOB: 06-Jan-1961 DOA: 07/06/2022  Referring physician: Dr. Tomi Mata, [place]. PCP: Mata, Lisa G, MD  Outpatient Specialists: Neurology. Patient coming from: SNF.  Chief Complaint: Rectal bleeding.  HPI: [name] is a 62 y.o. female with medical history significant for multiple strokes, in 2022, 2023 in 2024, hypertension, hyperlipidemia, insulin-dependent type 2 diabetes, peripheral vascular disease, non-Hodgkin's lymphoma, sickle cell trait, recently admitted for recurrent stroke for which she was hospitalized for 9 days and discharged on 06/21/2022, who presented to [facility] ED due to rectal bleed which she noted this afternoon.    States she has been dealing with intermittent diarrhea since her last admission.  This afternoon around noon she noted blood in her toilet bowl after having a bowel movement.  Denies any abdominal pain or nausea.  She is on aspirin and Brilinta and has been compliant with her home medications.  EMS was activated.  She was brought in the ED for further evaluation.  In the ED, vital signs notable for hypertension.  Vital signs significant for hemoglobin of 11.8 which is near her baseline.  MCV 77.5.  EDP will consult GI.  TRH, hospitalist service, was asked to admit.   ED Course: Tmax 97.9.  BP 170/91, pulse 84, respiratory 40, O2 saturation 100% on room air.  No imaging was obtained.  She received 500 cc IV fluid bolus and was started on IV fluid maintenance NS at 125 cc/h.  Review of Systems: Review of systems as noted in the HPI. All other systems reviewed and are negative.   Past Medical History:  Diagnosis Date   Abnormality of gait 05/10/2010   BACK PAIN 11/14/2008   Class 1 obesity due to excess calories with body mass index (BMI) of 31.0 to 31.9 in adult 02/07/2021   DIABETES MELLITUS, TYPE II 07/15/2008   Diplopia 07/15/2008   ECZEMA, ATOPIC 04/03/2009   GERD (gastroesophageal reflux disease)     Guillain-Barre ([place]) 1988   HYPERLIPIDEMIA 03/06/2009   HYPERTENSION 07/15/2008   Stroke ([place]) 2010, 2011   x2    Vertebral artery stenosis    Past Surgical History:  Procedure Laterality Date   ABDOMINAL HYSTERECTOMY     DILATION AND CURETTAGE OF UTERUS     FOOT SURGERY     IR ANGIO INTRA EXTRACRAN SEL COM CAROTID INNOMINATE UNI L MOD SED  09/17/2021   IR ANGIO INTRA EXTRACRAN SEL INTERNAL CAROTID UNI R MOD SED  09/17/2021   IR ANGIO VERTEBRAL SEL VERTEBRAL UNI R MOD SED  09/17/2021   IR US GUIDE VASC ACCESS RIGHT  09/17/2021    Social History:  reports that she has never smoked. She has never used smokeless tobacco. She reports that she does not drink alcohol and does not use drugs.   No Known Allergies  Family History  Problem Relation Age of Onset   Diabetes Sister    Asthma Other    Stroke Other    Hypertension Other    Diabetes Mother    Stroke Mother    Cancer Father        pt states hae had some kind of stomach cancer, ? stomach or colon   Breast cancer Neg Hx       Prior to Admission medications   Medication Sig Start Date End Date Taking? Authorizing Provider  amLODipine (NORVASC) 10 MG tablet Take 1 tablet by mouth once daily 05/09/22   Mata, Lisa G, MD  aspirin EC 81 MG tablet Take 1  tablet (81 mg total) by mouth daily. Swallow whole. 06/18/22   Bonnielee Haff, MD  atorvastatin (LIPITOR) 80 MG tablet Take 1 tablet (80 mg total) by mouth daily. 05/09/22   Mata, Lisa G, MD  B Complex Vitamins (VITAMIN B COMPLEX) TABS Take 1 tablet by mouth every morning.    [provider]  Blood Pressure Monitoring (BLOOD PRESSURE MONITOR AUTOMAT) DEVI 1 Device by Does not apply route daily. 08/29/18   Mata, Lisa G, MD  Cholecalciferol (VITAMIN D3 PO) Take 1 capsule by mouth every morning.    [provider]  Dulaglutide (TRULICITY) A999333 [id number] SOPN Inject 0.75 mg into the skin once a week. 04/13/22   Shamleffer, Melanie Crazier, MD  ezetimibe (ZETIA) 10 MG  tablet Take 1 tablet (10 mg total) by mouth daily. 05/09/22   Mata, Lisa G, MD  FLUoxetine (PROZAC) 20 MG capsule Take 1 capsule by mouth once daily 11/16/21   Mata, Lisa G, MD  glucose blood (CONTOUR NEXT TEST) test strip 1 each by Other route 2 (two) times daily. And lancets 2/day 03/09/18   [name], MD  glucose blood [facility] VERIO) test strip USE TO CHECK BLOOD SUGAR TWICE A DAY AND PRN 07/29/15   [name], MD  hydrALAZINE (APRESOLINE) 50 MG tablet TAKE 1 TABLET BY MOUTH THREE TIMES DAILY 11/16/21   Mata, Lisa G, MD  insulin aspart (NOVOLOG) 100 UNIT/ML injection Inject 0-15 Units into the skin 3 (three) times daily with meals. CBG < 70: Implement Hypoglycemia Standing Orders and refer to Hypoglycemia Standing Orders sidebar report CBG 70 - 120: 0 units CBG 121 - 150: 2 units CBG 151 - 200: 3 units CBG 201 - 250: 5 units CBG 251 - 300: 8 units CBG 301 - 350: 11 units CBG 351 - 400: 15 units 06/17/22   Bonnielee Haff, MD  lidocaine (LIDODERM) 5 % Place 1 patch onto the skin daily. Remove & Discard patch within 12 hours or as directed by MD 06/17/22   Bonnielee Haff, MD  loperamide (IMODIUM) 2 MG capsule Take 2 capsules (4 mg total) by mouth 3 (three) times daily as needed for diarrhea or loose stools. 06/17/22   Bonnielee Haff, MD  ONE TOUCH LANCETS MISC USE TO CHECK BLOOD SUGAR TWICE A DAY AND PRN 07/29/15   [name], MD  pantoprazole (PROTONIX) 40 MG tablet Take 1 tablet (40 mg total) by mouth daily. 05/09/22   Mata, Lisa G, MD  saccharomyces boulardii (FLORASTOR) 250 MG capsule Take 1 capsule (250 mg total) by mouth 2 (two) times daily. 06/17/22   Bonnielee Haff, MD  ticagrelor (BRILINTA) 90 MG TABS tablet Take 1 tablet (90 mg total) by mouth 2 (two) times daily. 06/17/22   Bonnielee Haff, MD    Physical Exam: BP (!) 170/89 (BP Location: Right Arm)   Pulse 93   Temp 97.9 F (36.6 C) (Oral)   Resp 15   Ht '5\' 4"'$  (1.626 m)   Wt 84.3 kg   SpO2 100%   BMI  31.90 kg/m   General: 62 y.o. year-old female well developed well nourished in no acute distress.  Alert and oriented x3. Cardiovascular: Regular rate and rhythm with no rubs or gallops.  No thyromegaly or JVD noted.  No lower extremity edema. 2/4 pulses in all 4 extremities. Respiratory: Clear to auscultation with no wheezes or rales. Good inspiratory effort. Abdomen: Soft nontender nondistended with normal bowel sounds x4 quadrants. Muskuloskeletal: No cyanosis, clubbing or edema noted bilaterally  Neuro: CN II-XII intact, strength, sensation, reflexes Skin: No ulcerative lesions noted or rashes Psychiatry: Judgement and insight appear normal. Mood is appropriate for condition and setting          Labs on Admission:  Basic Metabolic Panel: Recent Labs  Lab 07/06/22 1836  NA 138  K 3.4*  CL 108  CO2 21*  GLUCOSE 136*  BUN 15  CREATININE 0.89  CALCIUM 9.8   Liver Function Tests: Recent Labs  Lab 07/06/22 1836  AST 24  ALT 29  ALKPHOS 89  BILITOT 0.7  PROT 6.9  ALBUMIN 2.8*   No results for input(s): "LIPASE", "AMYLASE" in the last 168 hours. No results for input(s): "AMMONIA" in the last 168 hours. CBC: Recent Labs  Lab 07/06/22 1836  WBC 8.1  NEUTROABS 3.6  HGB 11.8*  HCT 36.6  MCV 77.5*  PLT 379   Cardiac Enzymes: No results for input(s): "CKTOTAL", "CKMB", "CKMBINDEX", "TROPONINI" in the last 168 hours.  BNP (last 3 results) No results for input(s): "BNP" in the last 8760 hours.  ProBNP (last 3 results) No results for input(s): "PROBNP" in the last 8760 hours.  CBG: No results for input(s): "GLUCAP" in the last 168 hours.  Radiological Exams on Admission: No results found.  EKG: I independently viewed the EKG done and my findings are as followed: Normal sinus rhythm rate of 78.  Nonspecific ST-T changes.  QTc 440.  Assessment/Plan Present on Admission:  GI bleed  Principal Problem:   GI bleed  Rectal bleeding, hematochezia Acute blood  loss anemia Onset of symptoms today this afternoon. Reports bright red blood per rectum On aspirin and Brilinta for recurrent stroke. Will cover with IV Protonix 40 mg twice daily as GI prophylaxis until seen by GI. Clear liquid diet now, n.p.o. after midnight until seen by GI. Per EDP, Dr. Tomi Mata, he will consult GI. Hemoglobin on arrival 11.8.  Baseline hemoglobin 12.0. Serial H&H every 6 hours. Transfuse hemoglobin less than 8.0. Closely monitor vital signs, maintain MAP greater than 65. Continue IV fluid hydration.  Metabolic acidosis Serum bicarb 21, anion gap 9 Continue IV fluid hydration. Repeat BMP in the morning.  Mild hypokalemia Serum potassium 3.4 Repleted orally. Check magnesium level Repeat BMP in the morning.  History of multiple stroke On aspirin and Brilinta, hold antiplatelets for now due to GI bleed, until seen by GI in the morning. Continue home Lipitor and Zetia. Consult neurology in the morning to weigh in on antiplatelets Resume antiplatelets if okay with GI.  Type 2 diabetes with hyperglycemia Hemoglobin A1c 11.4 on 05/30/2022. Start insulin sliding scale.  Chronic anxiety/depression Resume home Prozac  GERD Started IV PPI twice daily due to concern for possible upper GI bleed on dual antiplatelets.  Hypertension BP is not at goal, elevated. Resume home regimen. Closely monitor vital signs.  Loose stool, unclear etiology Denies watery stools Afebrile with no leukocytosis Resume home Imodium as needed.  Ambulatory dysfunction in the setting of multiple strokes. PT OT to assess in the morning Fall precautions.  Obesity BMI 31 Recommend weight loss outpatient with regular physical activity and healthy dieting.   DVT prophylaxis: SCDs.  Pharmacological DVT prophylaxis contraindicated in the setting of suspected active GI bleeding.  Code Status: Full code yesterday by the patient or staff.  Family Communication: None at bedside.  Called  her Sister Hassan Rowan at the patient's request, called x 2 and left 2 voicemail messages.  Disposition Plan: Admitted to progressive care unit  Consults called:  GI consulted by EDP  Admission status: Inpatient status.   Status is: Inpatient The patient requires at least 2 midnights for further evaluation and treatment of present condition.   Kayleen Memos MD [facility] Pager [phone]  If 7PM-7AM, please contact night-coverage [email or web address] Password [email or web address]  07/06/2022, 8:16 PM

## 2022-07-06 NOTE — ED Notes (Signed)
Pt cleaned up from large bloody stool. MD Tomi Bamberger to bedside for exam.

## 2022-07-06 NOTE — ED Provider Notes (Signed)
[facility] Provider Note   CSN: [id number] Arrival date & time: 07/06/22  [id number]     History  Chief Complaint  Patient presents with   Rectal Bleeding    [name] is a 62 y.o. female.   Rectal Bleeding    Patient has a history of prior strokes, diabetes, hypertension, hyperlipidemia, Guillain-Barr syndrome, reflux who presents ED for evaluation of diarrhea and blood in her stool.  Patient states she has been having trouble with diarrhea for the last few days.  She resides in a nursing facility because of her strokes.  Patient states staff at the facility noted blood in her stool.  She was sent to the ED for evaluation.  Patient denies any abdominal pain.  She is not having any fevers.    Home Medications Prior to Admission medications   Medication Sig Start Date End Date Taking? Authorizing Provider  amLODipine (NORVASC) 10 MG tablet Take 1 tablet by mouth once daily 05/09/22   [name], [name], MD  aspirin EC 81 MG tablet Take 1 tablet (81 mg total) by mouth daily. Swallow whole. 06/18/22   Bonnielee Haff, MD  atorvastatin (LIPITOR) 80 MG tablet Take 1 tablet (80 mg total) by mouth daily. 05/09/22   [name], [name], MD  B Complex Vitamins (VITAMIN B COMPLEX) TABS Take 1 tablet by mouth every morning.    [provider]  Blood Pressure Monitoring (BLOOD PRESSURE MONITOR AUTOMAT) DEVI 1 Device by Does not apply route daily. 08/29/18   [name], [name], MD  Cholecalciferol (VITAMIN D3 PO) Take 1 capsule by mouth every morning.    [provider]  Dulaglutide (TRULICITY) [id number] SOPN Inject 0.75 mg into the skin once a week. 04/13/22   Shamleffer, Melanie Crazier, MD  ezetimibe (ZETIA) 10 MG tablet Take 1 tablet (10 mg total) by mouth daily. 05/09/22   [name], [name], MD  FLUoxetine (PROZAC) 20 MG capsule Take 1 capsule by mouth once daily 11/16/21   [name], [name], MD  glucose blood (CONTOUR NEXT TEST) test strip 1  each by Other route 2 (two) times daily. And lancets 2/day 03/09/18   [name], MD  glucose blood [facility] VERIO) test strip USE TO CHECK BLOOD SUGAR TWICE A DAY AND PRN 07/29/15   [name], MD  hydrALAZINE (APRESOLINE) 50 MG tablet TAKE 1 TABLET BY MOUTH THREE TIMES DAILY 11/16/21   [name], [name], MD  insulin aspart (NOVOLOG) 100 UNIT/ML injection Inject 0-15 Units into the skin 3 (three) times daily with meals. CBG < 70: Implement Hypoglycemia Standing Orders and refer to Hypoglycemia Standing Orders sidebar report CBG 70 - 120: 0 units CBG 121 - 150: 2 units CBG 151 - 200: 3 units CBG 201 - 250: 5 units CBG 251 - 300: 8 units CBG 301 - 350: 11 units CBG 351 - 400: 15 units 06/17/22   Bonnielee Haff, MD  lidocaine (LIDODERM) 5 % Place 1 patch onto the skin daily. Remove & Discard patch within 12 hours or as directed by MD 06/17/22   Bonnielee Haff, MD  loperamide (IMODIUM) 2 MG capsule Take 2 capsules (4 mg total) by mouth 3 (three) times daily as needed for diarrhea or loose stools. 06/17/22   Bonnielee Haff, MD  ONE TOUCH LANCETS MISC USE TO CHECK BLOOD SUGAR TWICE A DAY AND PRN 07/29/15   [name], MD  pantoprazole (PROTONIX) 40 MG tablet Take 1 tablet (40 mg total)  by mouth daily. 05/09/22   [name], [name], MD  saccharomyces boulardii (FLORASTOR) 250 MG capsule Take 1 capsule (250 mg total) by mouth 2 (two) times daily. 06/17/22   Bonnielee Haff, MD  ticagrelor (BRILINTA) 90 MG TABS tablet Take 1 tablet (90 mg total) by mouth 2 (two) times daily. 06/17/22   Bonnielee Haff, MD      Allergies    Patient has no known allergies.    Review of Systems   Review of Systems  Gastrointestinal:  Positive for hematochezia.    Physical Exam Updated Vital Signs BP (!) 170/89 (BP Location: Right Arm)   Pulse 93   Temp 97.9 F (36.6 C) (Oral)   Resp 15   Ht 1.626 m ('5\' 4"'$ )   Wt 84.3 kg   SpO2 100%   BMI 31.90 kg/m  Physical Exam Vitals and nursing note reviewed.   Constitutional:      General: She is not in acute distress.    Appearance: She is well-developed.  HENT:     Head: Normocephalic and atraumatic.     Right Ear: External ear normal.     Left Ear: External ear normal.  Eyes:     General: No scleral icterus.       Right eye: No discharge.        Left eye: No discharge.     Conjunctiva/sclera: Conjunctivae normal.  Neck:     Trachea: No tracheal deviation.  Cardiovascular:     Rate and Rhythm: Normal rate and regular rhythm.  Pulmonary:     Effort: Pulmonary effort is normal. No respiratory distress.     Breath sounds: Normal breath sounds. No stridor. No wheezing or rales.  Abdominal:     General: Bowel sounds are normal. There is no distension.     Palpations: Abdomen is soft.     Tenderness: There is no abdominal tenderness. There is no guarding or rebound.  Genitourinary:    Comments: Maroon clots in the diaper noted Musculoskeletal:        General: No tenderness or deformity.     Cervical back: Neck supple.  Skin:    General: Skin is warm and dry.     Findings: No rash.  Neurological:     Mental Status: She is alert.     Cranial Nerves: No cranial nerve deficit, dysarthria or facial asymmetry.     Sensory: No sensory deficit.     Motor: Weakness present. No abnormal muscle tone or seizure activity.     Coordination: Coordination normal.     Comments: Left-sided hemiparesis, patient is able to move her fingers slightly the left side, normal speech  Psychiatric:        Mood and Affect: Mood normal.     ED Results / Procedures / Treatments   Labs (all labs ordered are listed, but only abnormal results are displayed) Labs Reviewed  COMPREHENSIVE METABOLIC PANEL - Abnormal; Notable for the following components:      Result Value   Potassium 3.4 (*)    CO2 21 (*)    Glucose, Bld 136 (*)    Albumin 2.8 (*)    All other components within normal limits  CBC WITH DIFFERENTIAL/PLATELET - Abnormal; Notable for the  following components:   Hemoglobin 11.8 (*)    MCV 77.5 (*)    MCH 25.0 (*)    RDW 15.6 (*)    Monocytes Absolute 1.2 (*)    All other components within normal limits  PROTIME-INR  HIV ANTIBODY (ROUTINE TESTING W REFLEX)  TYPE AND SCREEN    EKG None  Radiology No results found.  Procedures Procedures    Medications Ordered in ED Medications  sodium chloride 0.9 % bolus 500 mL (0 mLs Intravenous Stopped 07/06/22 1941)    And  0.9 %  sodium chloride infusion ( Intravenous New Bag/Given 07/06/22 2004)    ED Course/ Medical Decision Making/ A&P Clinical Course as of 07/06/22 2019  Wed Jul 06, 2022  1955 Hemoglobin hemoglobin stable at 11.8 [JK]  1955 Comprehensive metabolic panel(!) Metabolic panel unremarkable [JK]  2017 Case discussed with Dr Nevada Crane regarding admission [JK]    Clinical Course User Index [JK] Dorie Rank, MD                             Medical Decision Making Problems Addressed: Lower GI bleed: acute illness or injury that poses a threat to life or bodily functions  Amount and/or Complexity of Data Reviewed Labs: ordered. Decision-making details documented in ED Course. Discussion of management or test interpretation with external provider(s): Case discussed with Dr Nevada Crane regarding admission.  GI service contacted for consultation  Risk Prescription drug management. Decision regarding hospitalization.   Pt presented to the ED for acute onset rectal bleeding.  Maroon clots of blood noted on rectal exam.  Hemodynamically stable, normal hemoglobin.  Will require close monitoring, at risk for continued bleeding and severe blood loss.    Will admit to the hospital for monitoring, gi consultation.        Final Clinical Impression(s) / ED Diagnoses Final diagnoses:  Lower GI bleed    Rx / DC Orders ED Discharge Orders     None         Dorie Rank, MD 07/08/22 [id number]

## 2022-07-06 NOTE — ED Triage Notes (Signed)
Diarrhea since 1500 today. Also rectal bleeding with bright red blood. Denies nausea, vomiting, abdominal pain.

## 2022-07-06 NOTE — ED Notes (Signed)
ED TO INPATIENT HANDOFF REPORT  ED Nurse Name and Phone #: Caryl Pina RN [phone]  S Name/Age/Gender [name] 62 y.o. female Room/Bed: 032C/032C  Code Status   Code Status: Full Code  Home/SNF/Other Skilled nursing facility Patient oriented to: self, place, time, and situation Is this baseline? Yes   Triage Complete: Triage complete  Chief Complaint GI bleed [K92.2]  Triage Note Diarrhea since 1500 today. Also rectal bleeding with bright red blood. Denies nausea, vomiting, abdominal pain.    Allergies No Known Allergies  Level of Care/Admitting Diagnosis ED Disposition     ED Disposition  Admit   Condition  --   Comment  Hospital Area: [facility] [[id number]]  Level of Care: Progressive [102]  Admit to Progressive based on following criteria: GI, ENDOCRINE disease patients with GI bleeding, acute liver failure or pancreatitis, stable with diabetic ketoacidosis or thyrotoxicosis (hypothyroid) state.  May admit patient to [facility] or [facility] if equivalent level of care is available:: Yes  Covid Evaluation: Asymptomatic - no recent exposure (last 10 days) testing not required  Diagnosis: GI bleed [id number]  Admitting Physician: Kayleen Memos [id number]  Attending Physician: Kayleen Memos [id number]  Certification:: I certify this patient will need inpatient services for at least 2 midnights  Estimated Length of Stay: 2          B Medical/Surgery History Past Medical History:  Diagnosis Date   Abnormality of gait 05/10/2010   BACK PAIN 11/14/2008   Class 1 obesity due to excess calories with body mass index (BMI) of 31.0 to 31.9 in adult 02/07/2021   DIABETES MELLITUS, TYPE II 07/15/2008   Diplopia 07/15/2008   ECZEMA, ATOPIC 04/03/2009   GERD (gastroesophageal reflux disease)    Guillain-Barre ([place]) 1988   HYPERLIPIDEMIA 03/06/2009   HYPERTENSION 07/15/2008   Stroke ([place]) 2010, 2011   x2    Vertebral artery stenosis    Past  Surgical History:  Procedure Laterality Date   ABDOMINAL HYSTERECTOMY     DILATION AND CURETTAGE OF UTERUS     FOOT SURGERY     IR ANGIO INTRA EXTRACRAN SEL COM CAROTID INNOMINATE UNI L MOD SED  09/17/2021   IR ANGIO INTRA EXTRACRAN SEL INTERNAL CAROTID UNI R MOD SED  09/17/2021   IR ANGIO VERTEBRAL SEL VERTEBRAL UNI R MOD SED  09/17/2021   IR US GUIDE VASC ACCESS RIGHT  09/17/2021     A IV Location/Drains/Wounds Patient Lines/Drains/Airways Status     Active Line/Drains/Airways     Name Placement date Placement time Site Days   Peripheral IV 07/06/22 20 G 1" Anterior;Proximal;Right Forearm 07/06/22  1841  Forearm  less than 1   Wound / Incision (Open or Dehisced) 06/20/22 Non-pressure wound;Other (Comment) Bilateral 06/20/22  1022  --  16            Intake/Output Last 24 hours  Intake/Output Summary (Last 24 hours) at 07/06/2022 2058 Last data filed at 07/06/2022 1941 Gross per 24 hour  Intake 500 ml  Output --  Net 500 ml    Labs/Imaging Results for orders placed or performed during the hospital encounter of 07/06/22 (from the past 48 hour(s))  Comprehensive metabolic panel     Status: Abnormal   Collection Time: 07/06/22  6:36 PM  Result Value Ref Range   Sodium 138 135 - 145 mmol/L   Potassium 3.4 (L) 3.5 - 5.1 mmol/L   Chloride 108 98 - 111 mmol/L   CO2 21 (L) 22 -  32 mmol/L   Glucose, Bld 136 (H) 70 - 99 mg/dL    Comment: Glucose reference range applies only to samples taken after fasting for at least 8 hours.   BUN 15 8 - 23 mg/dL   Creatinine, Ser 0.89 0.44 - 1.00 mg/dL   Calcium 9.8 8.9 - 10.3 mg/dL   Total Protein 6.9 6.5 - 8.1 g/dL   Albumin 2.8 (L) 3.5 - 5.0 g/dL   AST 24 15 - 41 U/L   ALT 29 0 - 44 U/L   Alkaline Phosphatase 89 38 - 126 U/L   Total Bilirubin 0.7 0.3 - 1.2 mg/dL   GFR, Estimated >60 >60 mL/min    Comment: (NOTE) Calculated using the CKD-EPI Creatinine Equation (2021)    Anion gap 9 5 - 15    Comment: Performed at [facility] [address], [place], [place] [address]  CBC with Differential     Status: Abnormal   Collection Time: 07/06/22  6:36 PM  Result Value Ref Range   WBC 8.1 4.0 - 10.5 K/uL   RBC 4.72 3.87 - 5.11 MIL/uL   Hemoglobin 11.8 (L) 12.0 - 15.0 g/dL   HCT 36.6 36.0 - 46.0 %   MCV 77.5 (L) 80.0 - 100.0 fL   MCH 25.0 (L) 26.0 - 34.0 pg   MCHC 32.2 30.0 - 36.0 g/dL   RDW 15.6 (H) 11.5 - 15.5 %   Platelets 379 150 - 400 K/uL   nRBC 0.0 0.0 - 0.2 %   Neutrophils Relative % 44 %   Neutro Abs 3.6 1.7 - 7.7 K/uL   Lymphocytes Relative 38 %   Lymphs Abs 3.1 0.7 - 4.0 K/uL   Monocytes Relative 15 %   Monocytes Absolute 1.2 (H) 0.1 - 1.0 K/uL   Eosinophils Relative 2 %   Eosinophils Absolute 0.2 0.0 - 0.5 K/uL   Basophils Relative 1 %   Basophils Absolute 0.1 0.0 - 0.1 K/uL   Immature Granulocytes 0 %   Abs Immature Granulocytes 0.02 0.00 - 0.07 K/uL    Comment: Performed at [facility], [place] [address] [place], [place] [address]  Protime-INR     Status: None   Collection Time: 07/06/22  6:59 PM  Result Value Ref Range   Prothrombin Time 13.1 11.4 - 15.2 seconds   INR 1.0 0.8 - 1.2    Comment: (NOTE) INR goal varies based on device and disease states. Performed at [facility], [place] [address] [place], [place] [address]   Type and screen [place]     Status: None   Collection Time: 07/06/22  7:01 PM  Result Value Ref Range   ABO/RH(D) O POS    Antibody Screen NEG    Sample Expiration      07/09/2022,2359 Performed at [facility], [place] [address], [place], [place] [address]    No results found.  Pending Labs Unresulted Labs (From admission, onward)     Start     Ordered   07/07/22 0500  CBC  Tomorrow morning,   R        07/06/22 2050   07/07/22 0500  Comprehensive metabolic panel  Tomorrow morning,   R        07/06/22 2050   07/07/22 0500  Magnesium  Tomorrow morning,   R        07/06/22 2050   07/07/22 0500  Phosphorus  Tomorrow morning,    R  07/06/22 2050   07/06/22 2041  Hemoglobin and hematocrit, blood  Now then every 6 hours,   R (with TIMED occurrences)      07/06/22 2040   07/06/22 2017  HIV Antibody (routine testing w rflx)  (HIV Antibody (Routine testing w reflex) panel)  Once,   R        07/06/22 2016            Vitals/Pain Today's Vitals   07/06/22 1829 07/06/22 1831 07/06/22 1937  BP:  (!) 170/89   Pulse:  93   Resp:  15   Temp:  97.9 F (36.6 C)   TempSrc:  Oral   SpO2:  100%   Weight:   84.3 kg  Height:   '5\' 4"'$  (1.626 m)  PainSc: 0-No pain      Isolation Precautions No active isolations  Medications Medications  pantoprazole (PROTONIX) injection 40 mg (has no administration in time range)  atorvastatin (LIPITOR) tablet 80 mg (has no administration in time range)  ezetimibe (ZETIA) tablet 10 mg (has no administration in time range)  FLUoxetine (PROZAC) capsule 20 mg (has no administration in time range)  hydrALAZINE (APRESOLINE) tablet 10 mg (has no administration in time range)  loperamide (IMODIUM) capsule 4 mg (has no administration in time range)  saccharomyces boulardii (FLORASTOR) capsule 250 mg (has no administration in time range)  insulin aspart (novoLOG) injection 0-9 Units (has no administration in time range)  insulin aspart (novoLOG) injection 0-5 Units (has no administration in time range)  potassium chloride SA (KLOR-CON M) CR tablet 40 mEq (has no administration in time range)  sodium chloride 0.9 % bolus 500 mL (0 mLs Intravenous Stopped 07/06/22 1941)    And  0.9 %  sodium chloride infusion (has no administration in time range)  acetaminophen (TYLENOL) tablet 650 mg (has no administration in time range)  prochlorperazine (COMPAZINE) injection 5 mg (has no administration in time range)  melatonin tablet 5 mg (has no administration in time range)    Mobility non-ambulatory     Focused Assessments    R Recommendations: See Admitting Provider Note  Report  given to:   Additional Notes:

## 2022-07-07 DIAGNOSIS — G610 Guillain-Barre syndrome: Secondary | ICD-10-CM

## 2022-07-07 DIAGNOSIS — E785 Hyperlipidemia, unspecified: Secondary | ICD-10-CM

## 2022-07-07 DIAGNOSIS — D649 Anemia, unspecified: Secondary | ICD-10-CM

## 2022-07-07 DIAGNOSIS — I639 Cerebral infarction, unspecified: Secondary | ICD-10-CM

## 2022-07-07 DIAGNOSIS — K922 Gastrointestinal hemorrhage, unspecified: Secondary | ICD-10-CM | POA: Diagnosis not present

## 2022-07-07 DIAGNOSIS — K219 Gastro-esophageal reflux disease without esophagitis: Secondary | ICD-10-CM

## 2022-07-07 DIAGNOSIS — C8590 Non-Hodgkin lymphoma, unspecified, unspecified site: Secondary | ICD-10-CM

## 2022-07-07 DIAGNOSIS — I10 Essential (primary) hypertension: Secondary | ICD-10-CM

## 2022-07-07 DIAGNOSIS — K625 Hemorrhage of anus and rectum: Secondary | ICD-10-CM

## 2022-07-07 DIAGNOSIS — R599 Enlarged lymph nodes, unspecified: Secondary | ICD-10-CM

## 2022-07-07 DIAGNOSIS — R197 Diarrhea, unspecified: Secondary | ICD-10-CM

## 2022-07-07 DIAGNOSIS — I739 Peripheral vascular disease, unspecified: Secondary | ICD-10-CM

## 2022-07-07 DIAGNOSIS — K921 Melena: Secondary | ICD-10-CM

## 2022-07-07 LAB — CBC
HCT: 31.4 % — ABNORMAL LOW (ref 36.0–46.0)
HCT: 30.3 % — ABNORMAL LOW (ref 36.0–46.0)
Hemoglobin: 10.3 g/dL — ABNORMAL LOW (ref 12.0–15.0)
Hemoglobin: 10.1 g/dL — ABNORMAL LOW (ref 12.0–15.0)
MCH: 25.3 pg — ABNORMAL LOW (ref 26.0–34.0)
MCH: 25.4 pg — ABNORMAL LOW (ref 26.0–34.0)
MCHC: 32.8 g/dL (ref 30.0–36.0)
MCHC: 33.3 g/dL (ref 30.0–36.0)
MCV: 77.1 fL — ABNORMAL LOW (ref 80.0–100.0)
MCV: 76.3 fL — ABNORMAL LOW (ref 80.0–100.0)
Platelets: 321 10*3/uL (ref 150–400)
Platelets: 283 10*3/uL (ref 150–400)
RBC: 4.07 MIL/uL (ref 3.87–5.11)
RBC: 3.97 MIL/uL (ref 3.87–5.11)
RDW: 15.7 % — ABNORMAL HIGH (ref 11.5–15.5)
RDW: 15.7 % — ABNORMAL HIGH (ref 11.5–15.5)
WBC: 5.8 10*3/uL (ref 4.0–10.5)
WBC: 6 10*3/uL (ref 4.0–10.5)
nRBC: 0 % (ref 0.0–0.2)
nRBC: 0 % (ref 0.0–0.2)

## 2022-07-07 LAB — GLUCOSE, CAPILLARY
Glucose-Capillary: 136 mg/dL — ABNORMAL HIGH (ref 70–99)
Glucose-Capillary: 150 mg/dL — ABNORMAL HIGH (ref 70–99)
Glucose-Capillary: 173 mg/dL — ABNORMAL HIGH (ref 70–99)
Glucose-Capillary: 95 mg/dL (ref 70–99)
Glucose-Capillary: 121 mg/dL — ABNORMAL HIGH (ref 70–99)

## 2022-07-07 LAB — COMPREHENSIVE METABOLIC PANEL
ALT: 26 U/L (ref 0–44)
AST: 21 U/L (ref 15–41)
Albumin: 2.4 g/dL — ABNORMAL LOW (ref 3.5–5.0)
Alkaline Phosphatase: 80 U/L (ref 38–126)
Anion gap: 6 (ref 5–15)
BUN: 12 mg/dL (ref 8–23)
CO2: 21 mmol/L — ABNORMAL LOW (ref 22–32)
Calcium: 9.4 mg/dL (ref 8.9–10.3)
Chloride: 113 mmol/L — ABNORMAL HIGH (ref 98–111)
Creatinine, Ser: 0.84 mg/dL (ref 0.44–1.00)
GFR, Estimated: >60 mL/min (ref >60)
Glucose, Bld: 149 mg/dL — ABNORMAL HIGH (ref 70–99)
Potassium: 4.2 mmol/L (ref 3.5–5.1)
Sodium: 140 mmol/L (ref 135–145)
Total Bilirubin: 0.6 mg/dL (ref 0.3–1.2)
Total Protein: 5.9 g/dL — ABNORMAL LOW (ref 6.5–8.1)

## 2022-07-07 LAB — HEMOGLOBIN AND HEMATOCRIT, BLOOD
HCT: 30.4 % — ABNORMAL LOW (ref 36.0–46.0)
Hemoglobin: 10.3 g/dL — ABNORMAL LOW (ref 12.0–15.0)

## 2022-07-07 LAB — MAGNESIUM: Magnesium: 1.8 mg/dL (ref 1.7–2.4)

## 2022-07-07 LAB — PHOSPHORUS: Phosphorus: 3.2 mg/dL (ref 2.5–4.6)

## 2022-07-07 MED ORDER — PEG-KCL-NACL-NASULF-NA ASC-C 100 G PO SOLR
0.5000 | Freq: Once | ORAL | Status: AC
  Administered 2022-07-07: 100 g via ORAL
  Filled 2022-07-07: qty 1

## 2022-07-07 MED ORDER — SODIUM CHLORIDE 0.9 % IV SOLN: INTRAVENOUS | Status: DC

## 2022-07-07 MED ORDER — PEG-KCL-NACL-NASULF-NA ASC-C 100 G PO SOLR: 1.0000 | Freq: Once | ORAL | Status: DC

## 2022-07-07 MED ORDER — PEG-KCL-NACL-NASULF-NA ASC-C 100 G PO SOLR
0.5000 | Freq: Once | ORAL | Status: AC
  Administered 2022-07-08: 100 g via ORAL
  Filled 2022-07-07: qty 1

## 2022-07-07 NOTE — Progress Notes (Signed)
Patient is slow to finish the first part of the moviprep. Still about little over 1/4 full to go. Patient still has the 2nd part to start.   Patient being encouraged to keep drinking the movie prep. Bowel movements brown. No noticeable blood in stools.  Will continue to monitor

## 2022-07-07 NOTE — Progress Notes (Signed)
Mobility Specialist Progress Note:   07/07/22 1158  Mobility  Activity Transferred from chair to bed  Level of Assistance Moderate assist, patient does 50-74%  Assistive Device Stedy  Activity Response Tolerated well  $Mobility charge 1 Mobility   Pt in chair asking to get back to bed. No complaints of pain. ModA +2 to stand in stedy. Left in bed with call bell in reach and all needs met.   [name] Mobility Specialist Please contact via [employer] or  Rehab Office at [phone]

## 2022-07-07 NOTE — Evaluation (Signed)
Physical Therapy Evaluation Patient Details Name: [name] MRN: [id number] DOB: 13-Jul-1960 Today's Date: 07/07/2022  History of Present Illness  Pt is a 62 y/o female presenting with rectal bleed. Recent admission 2/11-2/20 from inpatient rehab with CVA extension. PMH: multiple strokes in 2022, 2023 and 2024, HTN, HLD, IDDM, PVD, non-Hodgkin's lymphoma, sickle cell trait, CKD stage IIIa  Clinical Impression  PTA pt was at [facility] where she was rehabbing from 2/11 CVA. Pt reports she needed assist for transfer to wheelchair and for wheelchair propulsion. Pt continues to be limited by her L sided hemiplegia. Pt very motivated to regain functional independence. Pt is maxAx2 for bed mobility and mod Ax2 for coming to standing in [place], maxAx2 for coming to standing from recliner. Pt with ability to engage knee and hip extensors with upright to maintain lock out and short bout of standing. PT recommends pt return to SNF when medically ready to continue rehab. PT will continue to follow acutely.     Recommendations for follow up therapy are one component of a multi-disciplinary discharge planning process, led by the attending physician.  Recommendations may be updated based on patient status, additional functional criteria and insurance authorization.  Follow Up Recommendations Skilled nursing-short term rehab (<3 hours/day) Can patient physically be transported by private vehicle: No    Assistance Recommended at Discharge Frequent or constant Supervision/Assistance  Patient can return home with the following  Two people to help with walking and/or transfers;Two people to help with bathing/dressing/bathroom;Assistance with cooking/housework;Assistance with feeding;Direct supervision/assist for medications management;Direct supervision/assist for financial management;Assist for transportation;Help with stairs or ramp for entrance    Equipment Recommendations    Recommendations for Other  Services       Functional Status Assessment Patient has had a recent decline in their functional status and demonstrates the ability to make significant improvements in function in a reasonable and predictable amount of time.     Precautions / Restrictions Precautions Precautions: Fall Precaution Comments: Left hemiparesis Restrictions Weight Bearing Restrictions: No      Mobility  Bed Mobility Overal bed mobility: Needs Assistance Bed Mobility: Supine to Sit     Supine to sit: Max assist, +2 for physical assistance, +2 for safety/equipment, HOB elevated     General bed mobility comments: Max A x 2 with assist needed to advance LLE and lift trunk. good effort of RLE and using R UE on bedrail    Transfers Overall transfer level: Needs assistance Equipment used: Ambulation equipment used, 2 person hand held assist Transfers: Sit to/from Stand, Bed to chair/wheelchair/BSC Sit to Stand: Mod assist, Max assist, +2 physical assistance, +2 safety/equipment           General transfer comment: Able to stand in [place] with Mod A x 2 and assist to reach L UE to [place] rail. Max A x 2 for sit to stand from recliner with B handheld assist with L UE support, L Knee blocking and assist to lift bottom Transfer via Lift Equipment: [place]        Balance Overall balance assessment: Needs assistance Sitting-balance support: Single extremity supported, Feet supported Sitting balance-Leahy Scale: Poor Sitting balance - Comments: Overall Min A to Mod A with R lateral lean d/t fatigue Postural control: Right lateral lean Standing balance support: Single extremity supported Standing balance-Leahy Scale: Poor  Pertinent Vitals/Pain Pain Assessment Pain Assessment: Faces Faces Pain Scale: Hurts a little bit Pain Location: bottom Pain Descriptors / Indicators: Sore Pain Intervention(s): Limited activity within patient's tolerance, Monitored during  session, Repositioned    Home Living Family/patient expects to be discharged to:: Skilled nursing facility Living Arrangements: Alone                 Additional Comments: Previously living at home but since two strokes, has been at [facility] and then to SNF with second stroke.    Prior Function Prior Level of Function : Needs assist             Mobility Comments: previously using RW since first stroke but using a wheelchair with assist for pivots at SNF rehab since second stroke. reports working on propelling self with wheelchair ADLs Comments: previously modified independent with ADLs before strokes. since CVA and DC to SNF rehab, pt has been having assistance with ADLs     Hand Dominance   Dominant Hand: Right    Extremity/Trunk Assessment   Upper Extremity Assessment Upper Extremity Assessment: Defer to OT evaluation LUE Deficits / Details: tendency for flexor position when sitting EOB. able to demo some movement in a digits, wrist and elbow with gravity minimized. Increased tone noted in digits and with elbow extension. Pt reports wearing a resting hand splint and it is currently at facility. Pt reports trying to rest with fingers straight LUE Coordination: decreased fine motor;decreased gross motor    Lower Extremity Assessment Lower Extremity Assessment: LLE deficits/detail;RLE deficits/detail RLE Deficits / Details: generalized weakness ROM WFL,    Cervical / Trunk Assessment Cervical / Trunk Assessment: Other exceptions;Kyphotic Cervical / Trunk Exceptions: head/shoulders forward  Communication   Communication: Expressive difficulties (some slower, slurred speech)  Cognition Arousal/Alertness: Awake/alert Behavior During Therapy: WFL for tasks assessed/performed Overall Cognitive Status: Impaired/Different from baseline Area of Impairment: Problem solving                             Problem Solving: Slow processing, Decreased initiation, Requires  verbal cues, Requires tactile cues, Difficulty sequencing General Comments: pleasant and cooperative, increased time to follow directions. Does have awareness into deficits and appropriate in all interactions        General Comments General comments (skin integrity, edema, etc.): Family at bedside        Assessment/Plan    PT Assessment Patient needs continued PT services  PT Problem List Decreased strength;Decreased balance;Decreased mobility;Decreased activity tolerance;Decreased coordination;Impaired sensation;Impaired tone;Obesity       PT Treatment Interventions DME instruction;Functional mobility training;Therapeutic activities;Therapeutic exercise;Balance training;Cognitive remediation;Patient/family education;Wheelchair mobility training    PT Goals (Current goals can be found in the Care Plan section)  Acute Rehab PT Goals Patient Stated Goal: to get better PT Goal Formulation: With patient Time For Goal Achievement: 07/21/22 Potential to Achieve Goals: Good    Frequency Min 3X/week     Co-evaluation PT/OT/SLP Co-Evaluation/Treatment: Yes Reason for Co-Treatment: For patient/therapist safety;To address functional/ADL transfers PT goals addressed during session: Mobility/safety with mobility;Balance;Strengthening/ROM;Proper use of DME OT goals addressed during session: ADL's and self-care;Strengthening/ROM       AM-PAC PT "6 Clicks" Mobility  Outcome Measure Help needed turning from your back to your side while in a flat bed without using bedrails?: A Lot Help needed moving from lying on your back to sitting on the side of a flat bed without using bedrails?: Total Help needed moving to and  from a bed to a chair (including a wheelchair)?: Total Help needed standing up from a chair using your arms (e.g., wheelchair or bedside chair)?: Total Help needed to walk in hospital room?: Total Help needed climbing 3-5 steps with a railing? : Total 6 Click Score: 7     End of Session Equipment Utilized During Treatment: Gait belt Activity Tolerance: Patient tolerated treatment well Patient left: with call bell/phone within reach;in chair;with chair alarm set Nurse Communication: Mobility status;Need for lift equipment PT Visit Diagnosis: Hemiplegia and hemiparesis;Difficulty in walking, not elsewhere classified (R26.2) Hemiplegia - Right/Left: Left Hemiplegia - dominant/non-dominant: Non-dominant Hemiplegia - caused by: Cerebral infarction    Time: [id number] PT Time Calculation (min) (ACUTE ONLY): 35 min   Charges:   PT Evaluation $PT Eval Moderate Complexity: 1 Mod          Victoriah Wilds B. Migdalia Dk PT, DPT Acute Rehabilitation Services Please use secure chat or  Call Office [phone]   [place] 07/07/2022, 4:14 PM

## 2022-07-07 NOTE — Progress Notes (Signed)
   07/06/22 2215  Vitals  Temp 98 F (36.7 C)  Temp Source Oral  BP 138/75  MAP (mmHg) 93  BP Location Left Arm  BP Method Automatic  Patient Position (if appropriate) Lying  Pulse Rate 87  Pulse Rate Source Monitor  ECG Heart Rate 87  Resp 18  MEWS COLOR  MEWS Score Color Green  Oxygen Therapy  SpO2 97 %  O2 Device Room Air  Pain Assessment  Pain Scale 0-10  Pain Score 0  MEWS Score  MEWS Temp 0  MEWS Systolic 0  MEWS Pulse 0  MEWS RR 0  MEWS LOC 0  MEWS Score 0   Received pt from ED. VSS. CHG bath done. Tele applied and verified x2. Small bloody BM noted when cleaning pt.  Called and updated sister Hassan Rowan per pt request. Martin Majestic over plan of care w/ her and pt. No other needs noted at this time.

## 2022-07-07 NOTE — H&P (View-Only) (Signed)
                                             Consultation Note   Referring Provider:  [facility] PCP: Jordan, [name], MD Primary Gastroenterologist: Kavitha Nandigam, MD Reason for consultation: GI bleed   Hospital Day: 2  ASSESSMENT  # 62 yo female with painless hematochezia on Brillinta ( last dose unknown but presumably 07/06/22). DDx considerations: diverticular bleed, polyp / neoplasm bleeding, hemorrhoidal bleeding.  - It is interesting that stool on exam is light brown which suggest distal colonic / rectal bleeding - On DRE there is dark red blood - Her hgb is actually stable at 10.3 .  . # Diarrhea per sister. This started during February hospitalization and thought to be 2/2 antibiotics.  On exam she has a small amount of soft light brown stool and dark red blood.   # Multiple CVAs (last one in Feb 2024). On Brillinta.   # Adenopathy . CTA 05/30/22 >>Bulky bilateral axillary, mesenteric, and retroperitoneal adenopathy, consistent with history of CLL. Adenopathy is progressed from 2021 exam.   PLAN  -If starts having frank diarrhea then will need stool studies.  -Monitor hgb, transfuse if necessary.  -Will need a colonoscopy at some point. Needs Brillinta washout  -CTA if significant bleeding   HPI:  Patient is a 62 y.o. year old female with a past medical history of non-Hodgkin's lymphoma, PVD, hypertension, diabetes, hyperlipidemia, multiple strokes on Brillinta, Guillain-Barre syndrome, GERD   See PMH for any additional medical problems.  Patient hospitalized  in February with recurrent CVA. She then had an extension of that stroke. During that admission she began having diarrhea fel to be related to antibiotics she got for possible aspiration PNA. Stool studies not done. Diarrhea improved for the most part with imodium.   She presented to ED from NH yesterday with diarrhea with blood. ED notable for hgb of 11.8 (stable), MCV 77. Normal BUN.   Sister at  bedside and helps with history ( see witnessed the bleeding). Darcus has been having loose stools again .  Yesterday she began passing "plum" colored blood. Sister describes it as a large amount . She was still passing blood when sister left the hospital last night she has had any associated N/V or abdominal pain. She is taking Brillinta. She doesn't think that she takes any NSAIDs.  Last colonoscopy was in 2011.    Previous GI History / Evaluation :  Colonoscopy 2011 - no polyps / cancer  Recent Imaging and Labs: DG Chest 1 View  Result Date: 06/13/2022 CLINICAL DATA:  Aspiration pneumonia EXAM: CHEST  1 VIEW COMPARISON:  Radiograph 06/12/2022 FINDINGS: Unchanged cardiomediastinal silhouette. Bandlike atelectasis in the lower lungs. No new airspace disease. No pleural effusion or evidence of pneumothorax. Bones are unchanged. IMPRESSION: No new airspace disease. Electronically Signed   By: Jacob  Kahn M.D.   On: 06/13/2022 07:58   DG Chest 1 View  Result Date: 06/12/2022 CLINICAL DATA:  Pneumonia EXAM: CHEST  1 VIEW COMPARISON:  Chest x-ray 05/29/2022 FINDINGS: Cardiomediastinal silhouette is within normal limits. There is a linear band of atelectasis or scarring in the left mid lung similar to prior. Lungs are otherwise clear. No pleural effusion or pneumothorax. No acute fractures. IMPRESSION: No acute cardiopulmonary process.  No significant interval change. Electronically Signed   By: Amy    Guttmann M.D.   On: 06/12/2022 16:17   MR BRAIN WO CONTRAST  Result Date: 06/12/2022 CLINICAL DATA:  Neuro deficit, acute, stroke suspected. New right sided weakness, worsening aphasia. EXAM: MRI HEAD WITHOUT CONTRAST TECHNIQUE: Multiplanar, multiecho pulse sequences of the brain and surrounding structures were obtained without intravenous contrast. COMPARISON:  Head CT 06/11/2022 and MRI/MRA 05/30/2022 FINDINGS: Multiple sequences are up to moderately motion degraded. Brain: The acute infarct in the right  ventral medulla on the prior MRI has enlarged and now extends to the left of midline. There is also a new punctate acute infarct in the left cerebellar hemisphere. Small T2 hyperintensities in the cerebral white matter bilaterally are unchanged and nonspecific but compatible with mild chronic small vessel ischemic disease chronic microhemorrhages are again noted in the right thalamus and right parietal white matter. Small chronic bilateral cerebellar infarcts are again noted with hemosiderin deposition associated with 1 of the left-sided infarcts. The ventricles and sulci are normal. No mass, midline shift, or extra-axial fluid collection is identified. Vascular: Unchanged abnormal appearance of the distal right vertebral artery, more fully evaluated on the prior MRA. Skull and upper cervical spine: No suspicious marrow lesion. Sinuses/Orbits: Unremarkable orbits. Mucous retention cyst in the right maxillary sinus. Small left mastoid effusion. Other: Partially visualized bilateral cervical lymphadenopathy with history of lymphoma. IMPRESSION: 1. Increased size of a recent right medullary infarct which now extends to the left of midline. 2. New punctate acute left cerebellar infarct. 3. Mild chronic small vessel ischemic disease. Chronic cerebellar infarcts. These results will be called to the ordering clinician or representative by the Radiologist Assistant, and communication documented in the PACS or [facility] Dashboard. Electronically Signed   By: Allen  Grady M.D.   On: 06/12/2022 13:24   CT HEAD WO CONTRAST (5MM)  Result Date: 06/11/2022 CLINICAL DATA:  Follow-up examination for stroke, slurred speech. EXAM: CT HEAD WITHOUT CONTRAST TECHNIQUE: Contiguous axial images were obtained from the base of the skull through the vertex without intravenous contrast. RADIATION DOSE REDUCTION: This exam was performed according to the departmental dose-optimization program which includes automated exposure control,  adjustment of the mA and/or kV according to patient size and/or use of iterative reconstruction technique. COMPARISON:  Comparison made with prior CT and MRI from 05/30/2022. FINDINGS: Brain: Cerebral volume within normal limits for age. Mild chronic small vessel ischemic disease noted. Previously identified small medullary infarct not visible by CT. No other acute large vessel territory infarct. No intracranial hemorrhage. No mass lesion or midline shift. No hydrocephalus or extra-axial fluid collection. Vascular: No abnormal hyperdense vessel. Calcified atherosclerosis present about the skull base. Skull: Scalp soft tissues and calvarium within normal limits. Sinuses/Orbits: Globes and orbital soft tissues demonstrate no acute finding. Paranasal sinuses remain largely clear. No mastoid effusion. Other: None. IMPRESSION: 1. No acute intracranial abnormality. 2. Mild chronic small vessel ischemic disease. Previously identified small medullary infarct not visible by CT. Electronically Signed   By: Benjamin  McClintock M.D.   On: 06/11/2022 21:07      Labs:  Recent Labs    07/06/22 1836 07/06/22 2050 07/07/22 0126  WBC 8.1  --   --   HGB 11.8* 11.0* 10.3*  HCT 36.6 34.6* 30.4*  PLT 379  --   --    Recent Labs    07/06/22 1836  NA 138  K 3.4*  CL 108  CO2 21*  GLUCOSE 136*  BUN 15  CREATININE 0.89  CALCIUM 9.8   Recent Labs    07/06/22   1836  PROT 6.9  ALBUMIN 2.8*  AST 24  ALT 29  ALKPHOS 89  BILITOT 0.7   No results for input(s): "HEPBSAG", "HCVAB", "HEPAIGM", "HEPBIGM" in the last 72 hours. Recent Labs    07/06/22 1859  LABPROT 13.1  INR 1.0    Past Medical History:  Diagnosis Date   Abnormality of gait 05/10/2010   BACK PAIN 11/14/2008   Class 1 obesity due to excess calories with body mass index (BMI) of 31.0 to 31.9 in adult 02/07/2021   DIABETES MELLITUS, TYPE II 07/15/2008   Diplopia 07/15/2008   ECZEMA, ATOPIC 04/03/2009   GERD (gastroesophageal reflux  disease)    Guillain-Barre (HCC) 1988   HYPERLIPIDEMIA 03/06/2009   HYPERTENSION 07/15/2008   Stroke (HCC) 2010, 2011   x2    Vertebral artery stenosis     Past Surgical History:  Procedure Laterality Date   ABDOMINAL HYSTERECTOMY     DILATION AND CURETTAGE OF UTERUS     FOOT SURGERY     IR ANGIO INTRA EXTRACRAN SEL COM CAROTID INNOMINATE UNI L MOD SED  09/17/2021   IR ANGIO INTRA EXTRACRAN SEL INTERNAL CAROTID UNI R MOD SED  09/17/2021   IR ANGIO VERTEBRAL SEL VERTEBRAL UNI R MOD SED  09/17/2021   IR US GUIDE VASC ACCESS RIGHT  09/17/2021    Family History  Problem Relation Age of Onset   Diabetes Sister    Asthma Other    Stroke Other    Hypertension Other    Diabetes Mother    Stroke Mother    Cancer Father        pt states hae had some kind of stomach cancer, ? stomach or colon   Breast cancer Neg Hx     Prior to Admission medications   Medication Sig Start Date End Date Taking? Authorizing Provider  acetaminophen (TYLENOL) 325 MG tablet Take 650 mg by mouth every 4 (four) hours as needed for mild pain. Do not exceed 3000mg in 24 hours   Yes [provider]  amLODipine (NORVASC) 10 MG tablet Take 1 tablet by mouth once daily 05/09/22  Yes Jordan, [name], MD  aspirin EC 81 MG tablet Take 1 tablet (81 mg total) by mouth daily. Swallow whole. 06/18/22  Yes Krishnan, Gokul, MD  atorvastatin (LIPITOR) 80 MG tablet Take 1 tablet (80 mg total) by mouth daily. 05/09/22  Yes Jordan, [name], MD  B Complex Vitamins (VITAMIN B COMPLEX) TABS Take 1 tablet by mouth every morning.   Yes [provider]  Cholecalciferol (VITAMIN D3 PO) Take 1 capsule by mouth every morning.   Yes [provider]  Dulaglutide (TRULICITY) 0.75 MG/0.5ML SOPN Inject 0.75 mg into the skin once a week. Patient taking differently: Inject 0.75 mg into the skin once a week. Friday 04/13/22  Yes Shamleffer, Ibtehal Jaralla, MD  ezetimibe (ZETIA) 10 MG tablet Take 1 tablet (10 mg total) by  mouth daily. 05/09/22  Yes Jordan, [name], MD  FLUoxetine (PROZAC) 20 MG capsule Take 1 capsule by mouth once daily 11/16/21  Yes Jordan, [name], MD  hydrALAZINE (APRESOLINE) 50 MG tablet TAKE 1 TABLET BY MOUTH THREE TIMES DAILY 11/16/21  Yes Jordan, [name], MD  insulin aspart (NOVOLOG) 100 UNIT/ML injection Inject 0-15 Units into the skin 3 (three) times daily with meals. CBG < 70: Implement Hypoglycemia Standing Orders and refer to Hypoglycemia Standing Orders sidebar report CBG 70 - 120: 0 units CBG 121 - 150: 2 units CBG 151 -   200: 3 units CBG 201 - 250: 5 units CBG 251 - 300: 8 units CBG 301 - 350: 11 units CBG 351 - 400: 15 units 06/17/22  Yes Krishnan, Gokul, MD  lidocaine (LIDODERM) 5 % Place 1 patch onto the skin daily. Remove & Discard patch within 12 hours or as directed by MD 06/17/22  Yes Krishnan, Gokul, MD  loperamide (IMODIUM) 2 MG capsule Take 2 capsules (4 mg total) by mouth 3 (three) times daily as needed for diarrhea or loose stools. 06/17/22  Yes Krishnan, Gokul, MD  omeprazole (PRILOSEC OTC) 20 MG tablet Take 20 mg by mouth daily.   Yes [provider]  saccharomyces boulardii (FLORASTOR) 250 MG capsule Take 1 capsule (250 mg total) by mouth 2 (two) times daily. 06/17/22  Yes Krishnan, Gokul, MD  ticagrelor (BRILINTA) 90 MG TABS tablet Take 1 tablet (90 mg total) by mouth 2 (two) times daily. 06/17/22  Yes Krishnan, Gokul, MD  Blood Pressure Monitoring (BLOOD PRESSURE MONITOR AUTOMAT) DEVI 1 Device by Does not apply route daily. 08/29/18   Jordan, [name], MD  glucose blood (CONTOUR NEXT TEST) test strip 1 each by Other route 2 (two) times daily. And lancets 2/day 03/09/18   Ellison, Sean, MD  glucose blood (ONETOUCH VERIO) test strip USE TO CHECK BLOOD SUGAR TWICE A DAY AND PRN 07/29/15   Kwiatkowski, Peter F, MD  ONE TOUCH LANCETS MISC USE TO CHECK BLOOD SUGAR TWICE A DAY AND PRN 07/29/15   Kwiatkowski, Peter F, MD    Current Facility-Administered Medications  Medication Dose  Route Frequency Provider Last Rate Last Admin   0.9 %  sodium chloride infusion   Intravenous Continuous Hall, Carole N, DO 50 mL/hr at 07/07/22 0617 Infusion Verify at 07/07/22 0617   acetaminophen (TYLENOL) tablet 650 mg  650 mg Oral Q6H PRN Hall, Carole N, DO   650 mg at 07/07/22 0347   atorvastatin (LIPITOR) tablet 80 mg  80 mg Oral Daily Hall, Carole N, DO   80 mg at 07/06/22 2222   ezetimibe (ZETIA) tablet 10 mg  10 mg Oral Daily Hall, Carole N, DO   10 mg at 07/06/22 2222   FLUoxetine (PROZAC) capsule 20 mg  20 mg Oral Daily Hall, Carole N, DO   20 mg at 07/06/22 2222   hydrALAZINE (APRESOLINE) tablet 10 mg  10 mg Oral TID Hall, Carole N, DO   10 mg at 07/06/22 2222   insulin aspart (novoLOG) injection 0-5 Units  0-5 Units Subcutaneous QHS Hall, Carole N, DO       insulin aspart (novoLOG) injection 0-9 Units  0-9 Units Subcutaneous TID WC Hall, Carole N, DO   1 Units at 07/07/22 0617   loperamide (IMODIUM) capsule 4 mg  4 mg Oral TID PRN Hall, Carole N, DO       melatonin tablet 5 mg  5 mg Oral QHS PRN Hall, Carole N, DO       pantoprazole (PROTONIX) injection 40 mg  40 mg Intravenous BID Hall, Carole N, DO   40 mg at 07/06/22 2130   prochlorperazine (COMPAZINE) injection 5 mg  5 mg Intravenous Q6H PRN Hall, Carole N, DO       saccharomyces boulardii (FLORASTOR) capsule 250 mg  250 mg Oral BID Hall, Carole N, DO   250 mg at 07/06/22 2222    Allergies as of 07/06/2022   (No Known Allergies)    Social History   Socioeconomic History   Marital status: Divorced      Spouse name: Not on file   Number of children: 0   Years of education: 13   Highest education level: Not on file  Occupational History   Occupation: custodian at [employer]  Tobacco Use   Smoking status: Never   Smokeless tobacco: Never  Vaping Use   Vaping Use: Never used  Substance and Sexual Activity   Alcohol use: No   Drug use: No   Sexual activity: Not Currently  Other Topics Concern   Not on file   Social History Narrative   Right Handed    Lives in a one story home    No caffeine   Social Determinants of Health   Financial Resource Strain: Not on file  Food Insecurity: No Food Insecurity (05/29/2022)   Hunger Vital Sign    Worried About Running Out of Food in the Last Year: Never true    Ran Out of Food in the Last Year: Never true  Transportation Needs: No Transportation Needs (05/29/2022)   PRAPARE - Transportation    Lack of Transportation (Medical): No    Lack of Transportation (Non-Medical): No  Physical Activity: Not on file  Stress: Not on file  Social Connections: Not on file  Intimate Partner Violence: Not At Risk (05/29/2022)   Humiliation, Afraid, Rape, and Kick questionnaire    Fear of Current or Ex-Partner: No    Emotionally Abused: No    Physically Abused: No    Sexually Abused: No    Review of Systems: All systems reviewed and negative except where noted in HPI.  Physical Exam: Vital signs in last 24 hours: Temp:  [97.7 F (36.5 C)-98.6 F (37 C)] 98.4 F (36.9 C) (03/07 0828) Pulse Rate:  [62-93] 62 (03/07 0828) Resp:  [15-20] 20 (03/07 0828) BP: (138-170)/(68-89) 154/75 (03/07 0828) SpO2:  [97 %-100 %] 98 % (03/07 0828) Weight:  [76.1 kg-84.3 kg] 76.1 kg (03/06 2229) Last BM Date : 07/06/22  General:  Alert female in NAD Psych:  Pleasant, cooperative. Normal mood and affect Eyes: Pupils equal Ears:  Normal auditory acuity Nose: No deformity, discharge or lesions Neck:  Supple, no masses felt Lungs:  Clear to auscultation.  Heart:  Regular rate, regular rhythm.  Abdomen:  Soft, nondistended, nontender, active bowel sounds, no masses felt Rectal :  Dark red blood around anus. On DRE there is residual dark red blood and soft LIGHT BROWN stool.  Msk: Symmetrical without gross deformities.  Neurologic:  Alert, oriented Extremities : Right sided weakness.  Skin:  Intact without significant lesions.    Intake/Output from previous day: 03/06  0701 - 03/07 0700 In: 940.8 [I.V.:440.8; IV Piggyback:500] Out: 400 [Urine:400] Intake/Output this shift:  No intake/output data recorded.    Principal Problem:   GI bleed    Lorella Gomez, NP-C @  07/07/2022, 8:46 AM     

## 2022-07-07 NOTE — Anesthesia Preprocedure Evaluation (Addendum)
Anesthesia Evaluation  Patient identified by MRN, date of birth, ID band Patient awake    Reviewed: Allergy & Precautions, NPO status , Patient's Chart, lab work & pertinent test results  Airway Mallampati: II  TM Distance: >3 FB Neck ROM: Full    Dental  (+) Dental Advisory Given, Missing, Poor Dentition, Chipped   Pulmonary    breath sounds clear to auscultation       Cardiovascular hypertension, + Peripheral Vascular Disease   Rhythm:Regular Rate:Normal  Echo: 1. Left ventricular ejection fraction, by estimation, is 60 to 65%. The  left ventricle has normal function. The left ventricle has no regional  wall motion abnormalities. There is mild concentric left ventricular  hypertrophy. Left ventricular diastolic  parameters are consistent with Grade I diastolic dysfunction (impaired  relaxation).   2. Right ventricular systolic function is normal. The right ventricular  size is normal. Tricuspid regurgitation signal is inadequate for assessing  PA pressure.   3. The mitral valve is grossly normal. No evidence of mitral valve  regurgitation. No evidence of mitral stenosis.   4. The aortic valve is tricuspid. Aortic valve regurgitation is not  visualized. No aortic stenosis is present.   5. The inferior vena cava is normal in size with greater than 50%  respiratory variability, suggesting right atrial pressure of 3 mmHg.      Neuro/Psych  PSYCHIATRIC DISORDERS Anxiety     CVA    GI/Hepatic Neg liver ROS,GERD  Medicated,,  Endo/Other  diabetes, Type 2, Insulin Dependent, Oral Hypoglycemic Agents    Renal/GU Renal disease     Musculoskeletal   Abdominal Normal abdominal exam  (+)   Peds  Hematology   Anesthesia Other Findings   Reproductive/Obstetrics                             Anesthesia Physical Anesthesia Plan  ASA: 3  Anesthesia Plan: MAC   Post-op Pain Management: Minimal or  no pain anticipated   Induction: Intravenous  PONV Risk Score and Plan: 0 and Propofol infusion  Airway Management Planned: Simple Face Mask and Natural Airway  Additional Equipment: None  Intra-op Plan:   Post-operative Plan:   Informed Consent: I have reviewed the patients History and Physical, chart, labs and discussed the procedure including the risks, benefits and alternatives for the proposed anesthesia with the patient or authorized representative who has indicated his/her understanding and acceptance.       Plan Discussed with: CRNA  Anesthesia Plan Comments:        Anesthesia Quick Evaluation

## 2022-07-07 NOTE — Consult Note (Signed)
Consultation Note   Referring Provider:  [facility] PCP: [name], [name], MD Primary Gastroenterologist: Harl Bowie, MD Reason for consultation: GI bleed   Hospital Day: 2  ASSESSMENT  # 62 yo female with painless hematochezia on Brillinta ( last dose unknown but presumably 07/06/22). DDx considerations: diverticular bleed, polyp / neoplasm bleeding, hemorrhoidal bleeding.  - It is interesting that stool on exam is light brown which suggest distal colonic / rectal bleeding - On DRE there is dark red blood - Her hgb is actually stable at 10.3 .  [name] # Diarrhea per sister. This started during February hospitalization and thought to be 2/2 antibiotics.  On exam she has a small amount of soft light brown stool and dark red blood.   # Multiple CVAs (last one in Feb 2024). On Brillinta.   # Adenopathy . CTA 05/30/22 >>Bulky bilateral axillary, mesenteric, and retroperitoneal adenopathy, consistent with history of CLL. Adenopathy is progressed from 2021 exam.   PLAN  -If starts having frank diarrhea then will need stool studies.  -Monitor hgb, transfuse if necessary.  -Will need a colonoscopy at some point. Needs Brillinta washout  -CTA if significant bleeding   HPI:  Patient is a 62 y.o. year old female with a past medical history of non-Hodgkin's lymphoma, PVD, hypertension, diabetes, hyperlipidemia, multiple strokes on Brillinta, Guillain-Barre syndrome, GERD   See PMH for any additional medical problems.  Patient hospitalized  in February with recurrent CVA. She then had an extension of that stroke. During that admission she began having diarrhea fel to be related to antibiotics she got for possible aspiration PNA. Stool studies not done. Diarrhea improved for the most part with imodium.   She presented to ED from [facility] yesterday with diarrhea with blood. ED notable for hgb of 11.8 (stable), MCV 77. Normal BUN.   Sister at  bedside and helps with history ( see witnessed the bleeding). Darriana has been having loose stools again .  Yesterday she began passing "plum" colored blood. Sister describes it as a large amount . She was still passing blood when sister left the hospital last night she has had any associated N/V or abdominal pain. She is taking Brillinta. She doesn't think that she takes any NSAIDs.  Last colonoscopy was in 2011.    Previous GI History / Evaluation :  Colonoscopy 2011 - no polyps / cancer  Recent Imaging and Labs: DG Chest 1 View  Result Date: 06/13/2022 CLINICAL DATA:  Aspiration pneumonia EXAM: CHEST  1 VIEW COMPARISON:  Radiograph 06/12/2022 FINDINGS: Unchanged cardiomediastinal silhouette. Bandlike atelectasis in the lower lungs. No new airspace disease. No pleural effusion or evidence of pneumothorax. Bones are unchanged. IMPRESSION: No new airspace disease. Electronically Signed   By: Maurine Simmering M.D.   On: 06/13/2022 07:58   DG Chest 1 View  Result Date: 06/12/2022 CLINICAL DATA:  Pneumonia EXAM: CHEST  1 VIEW COMPARISON:  Chest x-ray 05/29/2022 FINDINGS: Cardiomediastinal silhouette is within normal limits. There is a linear band of atelectasis or scarring in the left mid lung similar to prior. Lungs are otherwise clear. No pleural effusion or pneumothorax. No acute fractures. IMPRESSION: No acute cardiopulmonary process.  No significant interval change. Electronically Signed   By: Warren Lacy  Dagoberto Reef M.D.   On: 06/12/2022 16:17   MR BRAIN WO CONTRAST  Result Date: 06/12/2022 CLINICAL DATA:  Neuro deficit, acute, stroke suspected. New right sided weakness, worsening aphasia. EXAM: MRI HEAD WITHOUT CONTRAST TECHNIQUE: Multiplanar, multiecho pulse sequences of the brain and surrounding structures were obtained without intravenous contrast. COMPARISON:  Head CT 06/11/2022 and MRI/MRA 05/30/2022 FINDINGS: Multiple sequences are up to moderately motion degraded. Brain: The acute infarct in the right  ventral medulla on the prior MRI has enlarged and now extends to the left of midline. There is also a new punctate acute infarct in the left cerebellar hemisphere. Small T2 hyperintensities in the cerebral white matter bilaterally are unchanged and nonspecific but compatible with mild chronic small vessel ischemic disease chronic microhemorrhages are again noted in the right thalamus and right parietal white matter. Small chronic bilateral cerebellar infarcts are again noted with hemosiderin deposition associated with 1 of the left-sided infarcts. The ventricles and sulci are normal. No mass, midline shift, or extra-axial fluid collection is identified. Vascular: Unchanged abnormal appearance of the distal right vertebral artery, more fully evaluated on the prior MRA. Skull and upper cervical spine: No suspicious marrow lesion. Sinuses/Orbits: Unremarkable orbits. Mucous retention cyst in the right maxillary sinus. Small left mastoid effusion. Other: Partially visualized bilateral cervical lymphadenopathy with history of lymphoma. IMPRESSION: 1. Increased size of a recent right medullary infarct which now extends to the left of midline. 2. New punctate acute left cerebellar infarct. 3. Mild chronic small vessel ischemic disease. Chronic cerebellar infarcts. These results will be called to the ordering clinician or representative by the Radiologist Assistant, and communication documented in the PACS or [employer]. Electronically Signed   By: Logan Bores M.D.   On: 06/12/2022 13:24   CT HEAD WO CONTRAST (5MM)  Result Date: 06/11/2022 CLINICAL DATA:  Follow-up examination for stroke, slurred speech. EXAM: CT HEAD WITHOUT CONTRAST TECHNIQUE: Contiguous axial images were obtained from the base of the skull through the vertex without intravenous contrast. RADIATION DOSE REDUCTION: This exam was performed according to the departmental dose-optimization program which includes automated exposure control,  adjustment of the mA and/or kV according to patient size and/or use of iterative reconstruction technique. COMPARISON:  Comparison made with prior CT and MRI from 05/30/2022. FINDINGS: Brain: Cerebral volume within normal limits for age. Mild chronic small vessel ischemic disease noted. Previously identified small medullary infarct not visible by CT. No other acute large vessel territory infarct. No intracranial hemorrhage. No mass lesion or midline shift. No hydrocephalus or extra-axial fluid collection. Vascular: No abnormal hyperdense vessel. Calcified atherosclerosis present about the skull base. Skull: Scalp soft tissues and calvarium within normal limits. Sinuses/Orbits: Globes and orbital soft tissues demonstrate no acute finding. Paranasal sinuses remain largely clear. No mastoid effusion. Other: None. IMPRESSION: 1. No acute intracranial abnormality. 2. Mild chronic small vessel ischemic disease. Previously identified small medullary infarct not visible by CT. Electronically Signed   By: Jeannine Boga M.D.   On: 06/11/2022 21:07      Labs:  Recent Labs    07/06/22 1836 07/06/22 2050 07/07/22 0126  WBC 8.1  --   --   HGB 11.8* 11.0* 10.3*  HCT 36.6 34.6* 30.4*  PLT 379  --   --    Recent Labs    07/06/22 1836  NA 138  K 3.4*  CL 108  CO2 21*  GLUCOSE 136*  BUN 15  CREATININE 0.89  CALCIUM 9.8   Recent Labs    07/06/22  1836  PROT 6.9  ALBUMIN 2.8*  AST 24  ALT 29  ALKPHOS 89  BILITOT 0.7   No results for input(s): "HEPBSAG", "HCVAB", "HEPAIGM", "HEPBIGM" in the last 72 hours. Recent Labs    07/06/22 1859  LABPROT 13.1  INR 1.0    Past Medical History:  Diagnosis Date   Abnormality of gait 05/10/2010   BACK PAIN 11/14/2008   Class 1 obesity due to excess calories with body mass index (BMI) of 31.0 to 31.9 in adult 02/07/2021   DIABETES MELLITUS, TYPE II 07/15/2008   Diplopia 07/15/2008   ECZEMA, ATOPIC 04/03/2009   GERD (gastroesophageal reflux  disease)    Guillain-Barre ([place]) 1988   HYPERLIPIDEMIA 03/06/2009   HYPERTENSION 07/15/2008   Stroke ([place]) 2010, 2011   x2    Vertebral artery stenosis     Past Surgical History:  Procedure Laterality Date   ABDOMINAL HYSTERECTOMY     DILATION AND CURETTAGE OF UTERUS     FOOT SURGERY     IR ANGIO INTRA EXTRACRAN SEL COM CAROTID INNOMINATE UNI L MOD SED  09/17/2021   IR ANGIO INTRA EXTRACRAN SEL INTERNAL CAROTID UNI R MOD SED  09/17/2021   IR ANGIO VERTEBRAL SEL VERTEBRAL UNI R MOD SED  09/17/2021   IR US GUIDE VASC ACCESS RIGHT  09/17/2021    Family History  Problem Relation Age of Onset   Diabetes Sister    Asthma Other    Stroke Other    Hypertension Other    Diabetes Mother    Stroke Mother    Cancer Father        pt states hae had some kind of stomach cancer, ? stomach or colon   Breast cancer Neg Hx     Prior to Admission medications   Medication Sig Start Date End Date Taking? Authorizing Provider  acetaminophen (TYLENOL) 325 MG tablet Take 650 mg by mouth every 4 (four) hours as needed for mild pain. Do not exceed '3000mg'$  in 24 hours   Yes [provider]  amLODipine (NORVASC) 10 MG tablet Take 1 tablet by mouth once daily 05/09/22  Yes [name], [name], MD  aspirin EC 81 MG tablet Take 1 tablet (81 mg total) by mouth daily. Swallow whole. 06/18/22  Yes Bonnielee Haff, MD  atorvastatin (LIPITOR) 80 MG tablet Take 1 tablet (80 mg total) by mouth daily. 05/09/22  Yes [name], [name], MD  B Complex Vitamins (VITAMIN B COMPLEX) TABS Take 1 tablet by mouth every morning.   Yes [provider]  Cholecalciferol (VITAMIN D3 PO) Take 1 capsule by mouth every morning.   Yes [provider]  Dulaglutide (TRULICITY) [id number] [id number] SOPN Inject 0.75 mg into the skin once a week. Patient taking differently: Inject 0.75 mg into the skin once a week. Friday 04/13/22  Yes Shamleffer, Melanie Crazier, MD  ezetimibe (ZETIA) 10 MG tablet Take 1 tablet (10 mg total) by  mouth daily. 05/09/22  Yes [name], [name], MD  FLUoxetine (PROZAC) 20 MG capsule Take 1 capsule by mouth once daily 11/16/21  Yes [name], [name], MD  hydrALAZINE (APRESOLINE) 50 MG tablet TAKE 1 TABLET BY MOUTH THREE TIMES DAILY 11/16/21  Yes [name], [name], MD  insulin aspart (NOVOLOG) 100 UNIT/ML injection Inject 0-15 Units into the skin 3 (three) times daily with meals. CBG < 70: Implement Hypoglycemia Standing Orders and refer to Hypoglycemia Standing Orders sidebar report CBG 70 - 120: 0 units CBG 121 - 150: 2 units CBG 151 -  200: 3 units CBG 201 - 250: 5 units CBG 251 - 300: 8 units CBG 301 - 350: 11 units CBG 351 - 400: 15 units 06/17/22  Yes Bonnielee Haff, MD  lidocaine (LIDODERM) 5 % Place 1 patch onto the skin daily. Remove & Discard patch within 12 hours or as directed by MD 06/17/22  Yes Bonnielee Haff, MD  loperamide (IMODIUM) 2 MG capsule Take 2 capsules (4 mg total) by mouth 3 (three) times daily as needed for diarrhea or loose stools. 06/17/22  Yes Bonnielee Haff, MD  omeprazole (PRILOSEC OTC) 20 MG tablet Take 20 mg by mouth daily.   Yes [provider]  saccharomyces boulardii (FLORASTOR) 250 MG capsule Take 1 capsule (250 mg total) by mouth 2 (two) times daily. 06/17/22  Yes Bonnielee Haff, MD  ticagrelor (BRILINTA) 90 MG TABS tablet Take 1 tablet (90 mg total) by mouth 2 (two) times daily. 06/17/22  Yes Bonnielee Haff, MD  Blood Pressure Monitoring (BLOOD PRESSURE MONITOR AUTOMAT) DEVI 1 Device by Does not apply route daily. 08/29/18   [name], [name], MD  glucose blood (CONTOUR NEXT TEST) test strip 1 each by Other route 2 (two) times daily. And lancets 2/day 03/09/18   [name], MD  glucose blood [facility] VERIO) test strip USE TO CHECK BLOOD SUGAR TWICE A DAY AND PRN 07/29/15   [name], MD  ONE TOUCH LANCETS MISC USE TO CHECK BLOOD SUGAR TWICE A DAY AND PRN 07/29/15   [name], MD    Current Facility-Administered Medications  Medication Dose  Route Frequency Provider Last Rate Last Admin   0.9 %  sodium chloride infusion   Intravenous Continuous Kayleen Memos, DO 50 mL/hr at 07/07/22 0617 Infusion Verify at 07/07/22 0617   acetaminophen (TYLENOL) tablet 650 mg  650 mg Oral Q6H PRN Kayleen Memos, DO   650 mg at 07/07/22 0347   atorvastatin (LIPITOR) tablet 80 mg  80 mg Oral Daily Kayleen Memos, DO   80 mg at 07/06/22 2222   ezetimibe (ZETIA) tablet 10 mg  10 mg Oral Daily Irene Pap N, DO   10 mg at 07/06/22 2222   FLUoxetine (PROZAC) capsule 20 mg  20 mg Oral Daily Irene Pap N, DO   20 mg at 07/06/22 2222   hydrALAZINE (APRESOLINE) tablet 10 mg  10 mg Oral TID Kayleen Memos, DO   10 mg at 07/06/22 2222   insulin aspart (novoLOG) injection 0-5 Units  0-5 Units Subcutaneous QHS Hall, Carole N, DO       insulin aspart (novoLOG) injection 0-9 Units  0-9 Units Subcutaneous TID WC Irene Pap N, DO   1 Units at 07/07/22 [id number]   loperamide (IMODIUM) capsule 4 mg  4 mg Oral TID PRN Kayleen Memos, DO       melatonin tablet 5 mg  5 mg Oral QHS PRN Irene Pap N, DO       pantoprazole (PROTONIX) injection 40 mg  40 mg Intravenous BID Irene Pap N, DO   40 mg at 07/06/22 2130   prochlorperazine (COMPAZINE) injection 5 mg  5 mg Intravenous Q6H PRN Kayleen Memos, DO       saccharomyces boulardii (FLORASTOR) capsule 250 mg  250 mg Oral BID Irene Pap N, DO   250 mg at 07/06/22 2222    Allergies as of 07/06/2022   (No Known Allergies)    Social History   Socioeconomic History   Marital status: Divorced  Spouse name: Not on file   Number of children: 0   Years of education: 27   Highest education level: Not on file  Occupational History   Occupation: custodian at [employer]	[place]  Tobacco Use   Smoking status: Never   Smokeless tobacco: Never  Vaping Use   Vaping Use: Never used  Substance and Sexual Activity   Alcohol use: No   Drug use: No   Sexual activity: Not Currently  Other Topics Concern   Not on file   Social History Narrative   Right Handed    Lives in a one story home    No caffeine   Social Determinants of Health   Financial Resource Strain: Not on file  Food Insecurity: No Food Insecurity (05/29/2022)   Hunger Vital Sign    Worried About Running Out of Food in the Last Year: Never true    Ran Out of Food in the Last Year: Never true  Transportation Needs: No Transportation Needs (05/29/2022)   PRAPARE - Hydrologist (Medical): No    Lack of Transportation (Non-Medical): No  Physical Activity: Not on file  Stress: Not on file  Social Connections: Not on file  Intimate Partner Violence: Not At Risk (05/29/2022)   Humiliation, Afraid, Rape, and Kick questionnaire    Fear of Current or Ex-Partner: No    Emotionally Abused: No    Physically Abused: No    Sexually Abused: No    Review of Systems: All systems reviewed and negative except where noted in HPI.  Physical Exam: Vital signs in last 24 hours: Temp:  [97.7 F (36.5 C)-98.6 F (37 C)] 98.4 F (36.9 C) (03/07 0828) Pulse Rate:  [62-93] 62 (03/07 0828) Resp:  [15-20] 20 (03/07 0828) BP: (138-170)/(68-89) 154/75 (03/07 0828) SpO2:  [97 %-100 %] 98 % (03/07 0828) Weight:  [76.1 kg-84.3 kg] 76.1 kg (03/06 2229) Last BM Date : 07/06/22  General:  Alert female in NAD Psych:  Pleasant, cooperative. Normal mood and affect Eyes: Pupils equal Ears:  Normal auditory acuity Nose: No deformity, discharge or lesions Neck:  Supple, no masses felt Lungs:  Clear to auscultation.  Heart:  Regular rate, regular rhythm.  Abdomen:  Soft, nondistended, nontender, active bowel sounds, no masses felt Rectal :  Dark red blood around anus. On DRE there is residual dark red blood and soft LIGHT BROWN stool.  Msk: Symmetrical without gross deformities.  Neurologic:  Alert, oriented Extremities : Right sided weakness.  Skin:  Intact without significant lesions.    Intake/Output from previous day: 03/06  0701 - 03/07 0700 In: 940.8 [I.V.:440.8; IV Piggyback:500] Out: 400 [Urine:400] Intake/Output this shift:  No intake/output data recorded.    Principal Problem:   GI bleed    Tye Savoy, NP-C @  07/07/2022, 8:46 AM

## 2022-07-07 NOTE — Evaluation (Signed)
Occupational Therapy Evaluation Patient Details Name: [name] MRN: [id number] DOB: Jun 09, 1960 Today's Date: 07/07/2022   History of Present Illness Pt is a 62 y/o female presenting with rectal bleed. Recent admission 2/11-2/20 from inpatient rehab with CVA extension. PMH: multiple strokes in 2022, 2023 and 2024, HTN, HLD, IDDM, PVD, non-Hodgkin's lymphoma, sickle cell trait, CKD stage IIIa   Clinical Impression   Pt admitted from SNF rehab where since has been since prior admission d/t CVA extension. Pt presents with deficits in L sided strength, balance, endurance and tone in L UE. Pt eager to participate, requiring Max A x 2 for bed mobility and Min-Mod A for sitting balance EOB. Pt requires Mod A x 2 for standing in [place] and Max A x 2 for handheld assist. Encouraged self ROM and assist from family/visitors when present to prevent L UE contractures. Recommend DC back to SNF rehab as pt eager to continue therapy and progress functional skills.       Recommendations for follow up therapy are one component of a multi-disciplinary discharge planning process, led by the attending physician.  Recommendations may be updated based on patient status, additional functional criteria and insurance authorization.   Follow Up Recommendations  Skilled nursing-short term rehab (<3 hours/day)     Assistance Recommended at Discharge Frequent or constant Supervision/Assistance  Patient can return home with the following Two people to help with walking and/or transfers;A lot of help with bathing/dressing/bathroom;Assistance with cooking/housework;Direct supervision/assist for medications management;Assist for transportation;Help with stairs or ramp for entrance;Direct supervision/assist for financial management    Functional Status Assessment  Patient has had a recent decline in their functional status and demonstrates the ability to make significant improvements in function in a reasonable and  predictable amount of time.  Equipment Recommendations  Wheelchair cushion (measurements OT);Wheelchair (measurements OT)    Recommendations for Other Services       Precautions / Restrictions Precautions Precautions: Fall Precaution Comments: Left hemiparesis Restrictions Weight Bearing Restrictions: No      Mobility Bed Mobility Overal bed mobility: Needs Assistance Bed Mobility: Supine to Sit     Supine to sit: Max assist, +2 for physical assistance, +2 for safety/equipment, HOB elevated     General bed mobility comments: Max A x 2 with assist needed to advance LLE and lift trunk. good effort of RLE and using R UE on bedrail    Transfers Overall transfer level: Needs assistance Equipment used: Ambulation equipment used, 2 person hand held assist Transfers: Sit to/from Stand, Bed to chair/wheelchair/BSC Sit to Stand: Mod assist, Max assist, +2 physical assistance, +2 safety/equipment           General transfer comment: Able to stand in [place] with Mod A x 2 and assist to reach L UE to [employer] rail. Max A x 2 for sit to stand from recliner with B handheld assist with L UE support, L Knee blocking and assist to lift bottom Transfer via Lift Equipment: Stedy    Balance Overall balance assessment: Needs assistance Sitting-balance support: Single extremity supported, Feet supported Sitting balance-Leahy Scale: Poor Sitting balance - Comments: Overall Min A to Mod A with R lateral lean d/t fatigue Postural control: Right lateral lean Standing balance support: Single extremity supported Standing balance-Leahy Scale: Poor                             ADL either performed or assessed with clinical judgement   ADL Overall ADL's :  Needs assistance/impaired Eating/Feeding: Minimal assistance;Sitting   Grooming: Moderate assistance;Sitting   Upper Body Bathing: Maximal assistance;Sitting   Lower Body Bathing: Total assistance;Sitting/lateral leans;Sit to/from  stand;+2 for physical assistance;+2 for safety/equipment   Upper Body Dressing : Maximal assistance;Sitting   Lower Body Dressing: Total assistance;+2 for physical assistance;+2 for safety/equipment;Sitting/lateral leans;Sit to/from stand       Toileting- Water quality scientist and Hygiene: Total assistance;Bed level         General ADL Comments: Pt with deficits in L sided strength and coordination as well as balance due to recent CVA. Eager to participate but requires extensive assist for ADLs/transfers     Vision Baseline Vision/History: 0 No visual deficits Ability to See in Adequate Light: 0 Adequate Patient Visual Report: No change from baseline Vision Assessment?: No apparent visual deficits     Perception     Praxis      Pertinent Vitals/Pain Pain Assessment Pain Assessment: Faces Faces Pain Scale: Hurts a little bit Pain Location: bottom Pain Descriptors / Indicators: Sore Pain Intervention(s): Repositioned     Hand Dominance Right   Extremity/Trunk Assessment Upper Extremity Assessment Upper Extremity Assessment: LUE deficits/detail LUE Deficits / Details: tendency for flexor position when sitting EOB. able to demo some movement in a digits, wrist and elbow with gravity minimized. Increased tone noted in digits and with elbow extension. Pt reports wearing a resting hand splint and it is currently at facility. Pt reports trying to rest with fingers straight LUE Coordination: decreased fine motor;decreased gross motor   Lower Extremity Assessment Lower Extremity Assessment: Defer to PT evaluation   Cervical / Trunk Assessment Cervical / Trunk Assessment: Other exceptions;Kyphotic Cervical / Trunk Exceptions: head/shoulders forward   Communication Communication Communication: Expressive difficulties (some slower, slurred speech)   Cognition Arousal/Alertness: Awake/alert Behavior During Therapy: WFL for tasks assessed/performed Overall Cognitive Status:  Impaired/Different from baseline Area of Impairment: Problem solving                             Problem Solving: Slow processing, Decreased initiation, Requires verbal cues, Requires tactile cues, Difficulty sequencing General Comments: pleasant and cooperative, increased time to follow directions. Does have awareness into deficits and appropriate in all interactions     General Comments  Family at bedside, supportive and endorses assisting with ROM exercises here and at facility    Exercises     Shoulder Instructions      Home Living Family/patient expects to be discharged to:: Skilled nursing facility Living Arrangements: Alone                               Additional Comments: Previously living at home but since two strokes, has been at [facility] and then to SNF with second stroke.      Prior Functioning/Environment Prior Level of Function : Needs assist             Mobility Comments: previously using RW since first stroke but using a wheelchair with assist for pivots at SNF rehab since second stroke. reports working on propelling self with wheelchair ADLs Comments: previously modified independent with ADLs before strokes. since CVA and DC to SNF rehab, pt has been having assistance with ADLs        OT Problem List: Decreased activity tolerance;Impaired balance (sitting and/or standing);Decreased coordination;Decreased safety awareness;Decreased knowledge of precautions;Decreased knowledge of use of DME or AE;Impaired UE functional use;Impaired tone;Decreased  strength      OT Treatment/Interventions: Self-care/ADL training;Energy conservation;Therapeutic exercise;DME and/or AE instruction;Therapeutic activities;Patient/family education;Balance training;Manual therapy    OT Goals(Current goals can be found in the care plan section) Acute Rehab OT Goals Patient Stated Goal: continue rehab OT Goal Formulation: With patient Time For Goal Achievement:  07/21/22 Potential to Achieve Goals: Good  OT Frequency: Min 2X/week    Co-evaluation PT/OT/SLP Co-Evaluation/Treatment: Yes Reason for Co-Treatment: For patient/therapist safety;To address functional/ADL transfers   OT goals addressed during session: ADL's and self-care;Strengthening/ROM      AM-PAC OT "6 Clicks" Daily Activity     Outcome Measure Help from another person eating meals?: A Little Help from another person taking care of personal grooming?: A Lot Help from another person toileting, which includes using toliet, bedpan, or urinal?: Total Help from another person bathing (including washing, rinsing, drying)?: A Lot Help from another person to put on and taking off regular upper body clothing?: A Lot Help from another person to put on and taking off regular lower body clothing?: Total 6 Click Score: 11   End of Session Equipment Utilized During Treatment: Gait belt Nurse Communication: Mobility status  Activity Tolerance: Patient tolerated treatment well Patient left: in chair;with call bell/phone within reach;with chair alarm set;with family/visitor present  OT Visit Diagnosis: Unsteadiness on feet (R26.81);Other abnormalities of gait and mobility (R26.89);Muscle weakness (generalized) (M62.81);Hemiplegia and hemiparesis Hemiplegia - Right/Left: Left Hemiplegia - dominant/non-dominant: Non-Dominant Hemiplegia - caused by: Cerebral infarction                Time: [id number] OT Time Calculation (min): 34 min Charges:  OT General Charges $OT Visit: 1 Visit OT Evaluation $OT Eval Moderate Complexity: 1 Mod  Malachy Chamber, OTR/L Acute Rehab Services Office: [phone]   Layla Maw 07/07/2022, 11:17 AM

## 2022-07-07 NOTE — Progress Notes (Signed)
PROGRESS NOTE   [name]  [id number]    DOB: 07-20-1960    DOA: 07/06/2022  PCP: [name], [name], MD   I have briefly reviewed patients previous medical [facility] in [facility].  Chief Complaint  Patient presents with   Rectal Bleeding    Brief Narrative:  62 year old female, prior to recent admission lives by herself and independent, medical history significant for multiple strokes in 2022, 2023 and 2024, HTN, HLD, type II DM/IDDM, PVD, non-Hodgkin's lymphoma, sickle cell trait, stage IIIa CKD, recent hospitalization 06/12/22 - 06/21/22 for acute extension of previous stroke, with new onset of left-sided hemiparesis, dysphagia and facial droop, aspiration pneumonitis versus pneumonia, acute diarrhea and discharged to SNF on aspirin 81 Mg daily, Brilinta 90 Mg twice daily, presented to the ED on 07/06/2022 with history of new onset of rectal bleeding from the afternoon of admission and reported ongoing diarrhea at [facility] since hospital discharge.  [facility] consulted.   Assessment & Plan:  Principal Problem:   GI bleed   Rectal bleeding: Started at around 3 PM on 3/6.  Witnessed by patient's sister at SNF.  Holding aspirin and Brilinta (last dose on 3/6 at 9 AM and 8 AM respectively).  Hemodynamically stable.  Hemoglobin stable and at baseline compared to recent hospital discharge in the 10 g range.  Remains on IV Protonix but bleeding does not appear upper GI.  [facility] preliminary input appreciated, recommend colonoscopy at some point but will need Brilinta washout.  CTA abdomen and pelvis if significant bleeding.  Colonoscopy by Dr. Sharlett Iles in 2011 was normal.  DD: Broad, diverticular bleed, hemorrhoids, polyps, neoplasms versus others.?  Infectious given intermittent diarrhea but no abdominal pain, fever or leukocytosis.  Does not seem like ischemic colitis either.  Chronic diarrhea: Reportedly has had since hospital discharge recently.  Unclear etiology.  Based on GI rectal  exam, had small amount of soft light brown stool and dark red blood.  If has recurrent diarrhea may consider sending off for GI panel PCR.  Anemia of chronic disease: Hemoglobin stable in the 10 g range, same as at recent discharge.  Patient does not have acute blood loss anemia.  Hypokalemia Replaced.  Multiple strokes, residual left hemiparesis, dysarthria, facial asymmetry and ambulatory dysfunction Holding above antiplatelets due to rectal bleeding and resume when cleared by GI.  Continue home dose of Lipitor and Zetia.  PT and OT evaluating at bedside.  Type II DM with hyperglycemia/IDDM A1c 11.4 on 1/29.  Continue SSI.  Mildly uncontrolled and fluctuating.  Essential hypertension: Continue hydralazine 10 Mg 3 times daily.  Dyslipidemia: Continue statins and Zetia.  GERD PPI.  Chronic anxiety/depression: Continue home Prozac.  Body mass index is 28.8 kg/m./Overweight    DVT prophylaxis: SCDs Start: 07/06/22 2017     Code Status: Full Code:  ACP Documents: None present Family Communication: Sister via phone and sister-in-law at bedside. Disposition:  Status is: Inpatient Remains inpatient appropriate because: Needs further evaluation for rectal bleeding.     Consultants:   [facility].  Procedures:     Antimicrobials:      Subjective:  Patient seen this morning along with sister-in-law at bedside, therapy is working with her.  Patient sitting up at edge of bed with assistance.  Reportedly had 2 episodes of rectal bleeding prior to hospital arrival.  As per sister's report via phone, initial episode was Jell-O like dark blood coming out of her rectum and in depends but second episode was quarts  of blood.  Per patient, had another episode of rectal bleeding this morning, details not available.  No nausea, vomiting or abdominal pain.  Objective:   Vitals:   07/06/22 2345 07/07/22 0327 07/07/22 0828 07/07/22 1155  BP: (!) 150/73 (!) 152/79 (!) 154/75 (!)  155/76  Pulse: 85 84 62 76  Resp: '20 16 20 20  '$ Temp: 97.7 F (36.5 C) 98.3 F (36.8 C) 98.4 F (36.9 C) 97.7 F (36.5 C)  TempSrc: Oral Oral Oral Oral  SpO2: 99% 99% 98% 99%  Weight:      Height:        General exam: Middle-age female, moderately built and nourished, sitting up at edge of bed with therapist assistance, pleasant and in no obvious distress. Respiratory system: Clear to auscultation. Respiratory effort normal. Cardiovascular system: S1 & S2 heard, RRR. No JVD, murmurs, rubs, gallops or clicks. No pedal edema.  Telemetry personally reviewed: Sinus rhythm.  Occasional and transient nonsustained ST in the 140 range. Gastrointestinal system: Abdomen is nondistended, soft and nontender. No organomegaly or masses felt. Normal bowel sounds heard. Central nervous system: Alert and oriented.  Facial asymmetry/diminished left nasal fold.  Dysarthria. Extremities: Right limbs graded 5 x 5 power.  Left limbs with grade 2 x 5 power. Skin: No rashes, lesions or ulcers Psychiatry: Judgement and insight appear normal. Mood & affect appropriate.     Data Reviewed:   I have personally reviewed following labs and imaging studies   CBC: Recent Labs  Lab 07/06/22 1836 07/06/22 2050 07/07/22 0126 07/07/22 0830  WBC 8.1  --   --  5.8  NEUTROABS 3.6  --   --   --   HGB 11.8* 11.0* 10.3* 10.3*  HCT 36.6 34.6* 30.4* 31.4*  MCV 77.5*  --   --  77.1*  PLT 379  --   --  [id number]    Basic Metabolic Panel: Recent Labs  Lab 07/06/22 1836 07/07/22 0830  NA 138 140  K 3.4* 4.2  CL 108 113*  CO2 21* 21*  GLUCOSE 136* 149*  BUN 15 12  CREATININE 0.89 0.84  CALCIUM 9.8 9.4  MG  --  1.8  PHOS  --  3.2    Liver Function Tests: Recent Labs  Lab 07/06/22 1836 07/07/22 0830  AST 24 21  ALT 29 26  ALKPHOS 89 80  BILITOT 0.7 0.6  PROT 6.9 5.9*  ALBUMIN 2.8* 2.4*    CBG: Recent Labs  Lab 07/07/22 0613 07/07/22 0827 07/07/22 1153  GLUCAP 136* 150* 173*    Microbiology  Studies:  No results found for this or any previous visit (from the past 240 hour(s)).  Radiology Studies:  No results found.  Scheduled Meds:    atorvastatin  80 mg Oral Daily   ezetimibe  10 mg Oral Daily   FLUoxetine  20 mg Oral Daily   hydrALAZINE  10 mg Oral TID   insulin aspart  0-5 Units Subcutaneous QHS   insulin aspart  0-9 Units Subcutaneous TID WC   pantoprazole (PROTONIX) IV  40 mg Intravenous BID   peg 3350 powder  0.5 kit Oral Once   And   peg 3350 powder  0.5 kit Oral Once   saccharomyces boulardii  250 mg Oral BID    Continuous Infusions:    sodium chloride 50 mL/hr at 07/07/22 0617     LOS: 1 day     Vernell Leep, MD,  FACP, [place], [facility], [facility], [facility]   [facility] [place]     To contact the attending provider between 7A-7P or the covering provider during after hours 7P-7A, please log into the web site [email or web address] and access using universal [place] password for that web site. If you do not have the password, please call the hospital operator.  07/07/2022, 1:57 PM

## 2022-07-08 ENCOUNTER — Encounter (HOSPITAL_COMMUNITY): Payer: Self-pay | Admitting: Internal Medicine

## 2022-07-08 ENCOUNTER — Encounter (HOSPITAL_COMMUNITY)
Admission: EM | Disposition: A | Discharge status: Skilled Nursing Facility | Payer: Self-pay | Source: Skilled Nursing Facility | Attending: Internal Medicine

## 2022-07-08 ENCOUNTER — Inpatient Hospital Stay (HOSPITAL_COMMUNITY): Payer: Commercial Managed Care - HMO | Admitting: Anesthesiology

## 2022-07-08 DIAGNOSIS — K921 Melena: Secondary | ICD-10-CM

## 2022-07-08 DIAGNOSIS — K648 Other hemorrhoids: Secondary | ICD-10-CM

## 2022-07-08 DIAGNOSIS — D122 Benign neoplasm of ascending colon: Secondary | ICD-10-CM

## 2022-07-08 DIAGNOSIS — E1151 Type 2 diabetes mellitus with diabetic peripheral angiopathy without gangrene: Secondary | ICD-10-CM | POA: Diagnosis not present

## 2022-07-08 DIAGNOSIS — Z794 Long term (current) use of insulin: Secondary | ICD-10-CM | POA: Diagnosis not present

## 2022-07-08 DIAGNOSIS — Z7984 Long term (current) use of oral hypoglycemic drugs: Secondary | ICD-10-CM

## 2022-07-08 DIAGNOSIS — K633 Ulcer of intestine: Secondary | ICD-10-CM

## 2022-07-08 DIAGNOSIS — K922 Gastrointestinal hemorrhage, unspecified: Secondary | ICD-10-CM | POA: Diagnosis not present

## 2022-07-08 DIAGNOSIS — D124 Benign neoplasm of descending colon: Secondary | ICD-10-CM

## 2022-07-08 DIAGNOSIS — Q438 Other specified congenital malformations of intestine: Secondary | ICD-10-CM

## 2022-07-08 HISTORY — PX: COLONOSCOPY WITH PROPOFOL: SHX5780

## 2022-07-08 HISTORY — PX: BIOPSY: SHX5522

## 2022-07-08 LAB — GLUCOSE, CAPILLARY
Glucose-Capillary: 123 mg/dL — ABNORMAL HIGH (ref 70–99)
Glucose-Capillary: 107 mg/dL — ABNORMAL HIGH (ref 70–99)
Glucose-Capillary: 116 mg/dL — ABNORMAL HIGH (ref 70–99)
Glucose-Capillary: 172 mg/dL — ABNORMAL HIGH (ref 70–99)
Glucose-Capillary: 140 mg/dL — ABNORMAL HIGH (ref 70–99)
Glucose-Capillary: 158 mg/dL — ABNORMAL HIGH (ref 70–99)

## 2022-07-08 LAB — CBC
HCT: 31.2 % — ABNORMAL LOW (ref 36.0–46.0)
Hemoglobin: 10.3 g/dL — ABNORMAL LOW (ref 12.0–15.0)
MCH: 25.4 pg — ABNORMAL LOW (ref 26.0–34.0)
MCHC: 33 g/dL (ref 30.0–36.0)
MCV: 77 fL — ABNORMAL LOW (ref 80.0–100.0)
Platelets: 303 10*3/uL (ref 150–400)
RBC: 4.05 MIL/uL (ref 3.87–5.11)
RDW: 15.9 % — ABNORMAL HIGH (ref 11.5–15.5)
WBC: 6.8 10*3/uL (ref 4.0–10.5)
nRBC: 0 % (ref 0.0–0.2)

## 2022-07-08 LAB — BASIC METABOLIC PANEL
Anion gap: 4 — ABNORMAL LOW (ref 5–15)
BUN: 10 mg/dL (ref 8–23)
CO2: 22 mmol/L (ref 22–32)
Calcium: 9.4 mg/dL (ref 8.9–10.3)
Chloride: 113 mmol/L — ABNORMAL HIGH (ref 98–111)
Creatinine, Ser: 0.82 mg/dL (ref 0.44–1.00)
GFR, Estimated: >60 mL/min (ref >60)
Glucose, Bld: 111 mg/dL — ABNORMAL HIGH (ref 70–99)
Potassium: 3.7 mmol/L (ref 3.5–5.1)
Sodium: 139 mmol/L (ref 135–145)

## 2022-07-08 SURGERY — COLONOSCOPY WITH PROPOFOL
Anesthesia: Monitor Anesthesia Care

## 2022-07-08 MED ORDER — LACTATED RINGERS IV SOLN: INTRAVENOUS | Status: DC

## 2022-07-08 MED ORDER — OMEPRAZOLE MAGNESIUM 20 MG PO TBEC: 20.0000 mg | DELAYED_RELEASE_TABLET | Freq: Every day | ORAL | Status: DC

## 2022-07-08 MED ORDER — MESALAMINE 1000 MG RE SUPP
1000.0000 mg | Freq: Every day | RECTAL | Status: DC
  Administered 2022-07-08 – 2022-07-11 (×4): 1000 mg via RECTAL
  Filled 2022-07-08 (×4): qty 1

## 2022-07-08 MED ORDER — PROPOFOL 10 MG/ML IV BOLUS
INTRAVENOUS | Status: DC | PRN
  Administered 2022-07-08: 20 mg via INTRAVENOUS

## 2022-07-08 MED ORDER — ASPIRIN 81 MG PO TBEC
81.0000 mg | DELAYED_RELEASE_TABLET | Freq: Every day | ORAL | Status: DC
  Administered 2022-07-08 – 2022-07-11 (×4): 81 mg via ORAL
  Filled 2022-07-08 (×4): qty 1

## 2022-07-08 MED ORDER — MESALAMINE 1000 MG RE SUPP
1000.0000 mg | Freq: Every day | RECTAL | Status: DC
  Administered 2022-07-09 – 2022-07-10 (×2): 1000 mg via RECTAL
  Filled 2022-07-08 (×4): qty 1

## 2022-07-08 MED ORDER — AMLODIPINE BESYLATE 10 MG PO TABS
10.0000 mg | ORAL_TABLET | Freq: Every day | ORAL | Status: DC
  Administered 2022-07-08 – 2022-07-11 (×4): 10 mg via ORAL
  Filled 2022-07-08 (×4): qty 1

## 2022-07-08 MED ORDER — PROPOFOL 500 MG/50ML IV EMUL
INTRAVENOUS | Status: DC | PRN
  Administered 2022-07-08: 125 ug/kg/min via INTRAVENOUS

## 2022-07-08 MED ORDER — TICAGRELOR 90 MG PO TABS
90.0000 mg | ORAL_TABLET | Freq: Two times a day (BID) | ORAL | Status: DC
  Administered 2022-07-08 – 2022-07-11 (×6): 90 mg via ORAL
  Filled 2022-07-08 (×6): qty 1

## 2022-07-08 MED ORDER — PANTOPRAZOLE SODIUM 40 MG PO TBEC
40.0000 mg | DELAYED_RELEASE_TABLET | Freq: Every day | ORAL | Status: DC
  Administered 2022-07-09 – 2022-07-11 (×3): 40 mg via ORAL
  Filled 2022-07-08 (×4): qty 1

## 2022-07-08 SURGICAL SUPPLY — 22 items

## 2022-07-08 NOTE — Transfer of Care (Signed)
Immediate Anesthesia Transfer of Care Note  Patient: [name]  Procedure(s) Performed: COLONOSCOPY WITH PROPOFOL BIOPSY  Patient Location: PACU  Anesthesia Type:MAC  Level of Consciousness: drowsy and patient cooperative  Airway & Oxygen Therapy: Patient Spontanous Breathing and Patient connected to face mask oxygen  Post-op Assessment: Report given to RN, Post -op Vital signs reviewed and stable, and Patient moving all extremities X 4  Post vital signs: Reviewed and stable  Last Vitals:  Vitals Value Taken Time  BP    Temp    Pulse    Resp    SpO2      Last Pain:  Vitals:   07/08/22 0328  TempSrc: Oral  PainSc:          Complications: No notable events documented.

## 2022-07-08 NOTE — Progress Notes (Addendum)
PROGRESS NOTE   [name]  [id number]    DOB: 03-Apr-1961    DOA: 07/06/2022  PCP: [name], [name], MD   I have briefly reviewed patients previous medical records in [facility].  Chief Complaint  Patient presents with   Rectal Bleeding    Brief Narrative:  62 year old female, prior to recent admission lives by herself and independent, medical history significant for multiple strokes in 2022, 2023 and 2024, HTN, HLD, type II DM/IDDM, PVD, non-Hodgkin's lymphoma, sickle cell trait, stage IIIa CKD, recent hospitalization 06/12/22 - 06/21/22 for acute extension of previous stroke, with new onset of left-sided hemiparesis, dysphagia and facial droop, aspiration pneumonitis versus pneumonia, acute diarrhea and discharged to SNF on aspirin 81 Mg daily, Brilinta 90 Mg twice daily, presented to the ED on 07/06/2022 with history of new onset of rectal bleeding from the afternoon of admission and reported ongoing diarrhea at [facility] since hospital discharge.  [facility] consulted.  Colonoscopy 3/8 showed few ulcers in the distal rectum amongst other findings, biopsied, likely the cause of her rectal bleeding.   Assessment & Plan:  Principal Problem:   GI bleed Active Problems:   Antiplatelet or antithrombotic long-term use   Rectal bleeding   Anemia   Colon ulcer   Rectal bleeding due to rectal ulceration: Started at around 3 PM on 3/6.  Witnessed by patient's sister at SNF.  Holding aspirin and Brilinta (last dose on 3/6 at 9 AM and 8 AM respectively).  Hemodynamically stable.  Hemoglobin stable and at baseline compared to recent hospital discharge in the 10 g range.  Remains on IV Protonix but bleeding does not appear upper GI.   GI was consulted.  Colonoscopy 3/8 showed some polyps in the ascending, descending colon, few ulcers in the distal rectum, biopsied to assess for IBD and suspect this is the cause of her bleeding, internal hemorrhoids.  Suspecting that the rectal ulceration  could be postinfectious versus stercoral (although no reported constipation and had diarrheal illness recently) versus prolapse change versus IBD.  As communicated with GI, resumed Brilinta and aspirin, regular diet, [place] suppositories, reassess in a.m. and if no further bleeding and remained stable then could possibly DC back to SNF with outpatient follow-up with GI with pathology results etc.   Chronic diarrhea: Reportedly has had since hospital discharge recently.  Unclear etiology.  Based on GI rectal exam, had small amount of soft light brown stool and dark red blood.  If has recurrent diarrhea may consider sending off for GI panel PCR.  Colonoscopy results as noted above.  Anemia of chronic disease: Hemoglobin stable in the 10 g range, same as at recent discharge.  Patient does not have acute blood loss anemia.  Remains stable.  Hypokalemia Replaced.  Multiple strokes, residual left hemiparesis, dysarthria, facial asymmetry and ambulatory dysfunction Continue home dose of Lipitor and Zetia.  PT and OT evaluated and recommend return to SNF.  Discussed with TOC regarding potential discharge to SNF this weekend.  Brilinta and aspirin which were briefly held but resumed again on 3/8.  Type II DM with hyperglycemia/IDDM A1c 11.4 on 1/29.  Continue SSI.  Mildly uncontrolled and fluctuating.  No changes made.  Essential hypertension: Continue hydralazine 10 Mg 3 times daily.  Resumed home dose of amlodipine.  Dyslipidemia: Continue statins and Zetia.  GERD PPI.  Chronic anxiety/depression: Continue home Prozac.  Body mass index is 28.8 kg/m./Overweight    DVT prophylaxis: SCDs Start: 07/06/22 2017     Code  Status: Full Code:  ACP Documents: None present Family Communication: Sister via phone Disposition:  Monitor overnight for recurrent rectal bleeding.  If stable, possible DC to SNF on 3/9.     Consultants:   [facility].  Procedures:   Colonoscopy  3/8  Antimicrobials:      Subjective:  Seen late this morning post colonoscopy.  Denied complaints.  Updated her regarding colonoscopy results.  Objective:   Vitals:   07/08/22 0945 07/08/22 1000 07/08/22 1015 07/08/22 1056  BP: (!) 161/90 (!) 167/88 (!) 166/81 (!) 166/78  Pulse: 79 79 74 80  Resp: '19 17  18  '$ Temp: (!) 97 F (36.1 C)  97.7 F (36.5 C)   TempSrc:      SpO2: 100% 96% 96% 97%  Weight:      Height:        General exam: Middle-age female, moderately built and nourished, sitting up at edge of bed with therapist assistance, pleasant and in no obvious distress. Respiratory system: Clear to auscultation.  No increased work of breathing. Cardiovascular system: S1 & S2 heard, RRR. No JVD, murmurs, rubs, gallops or clicks. No pedal edema.  Off telemetry. Gastrointestinal system: Abdomen is nondistended, soft and nontender. No organomegaly or masses felt. Normal bowel sounds heard. Central nervous system: Alert and oriented.  Facial asymmetry/diminished left nasal fold.  Dysarthria. Extremities: Right limbs graded 5 x 5 power.  Left limbs with grade 2 x 5 power. Skin: No rashes, lesions or ulcers Psychiatry: Judgement and insight appear normal. Mood & affect appropriate.     Data Reviewed:   I have personally reviewed following labs and imaging studies   CBC: Recent Labs  Lab 07/06/22 1836 07/06/22 2050 07/07/22 0830 07/07/22 1414 07/08/22 0129  WBC 8.1  --  5.8 6.0 6.8  NEUTROABS 3.6  --   --   --   --   HGB 11.8*   < > 10.3* 10.1* 10.3*  HCT 36.6   < > 31.4* 30.3* 31.2*  MCV 77.5*  --  77.1* 76.3* 77.0*  PLT 379  --  321 283 303   < > = values in this interval not displayed.    Basic Metabolic Panel: Recent Labs  Lab 07/06/22 1836 07/07/22 0830 07/08/22 0129  NA 138 140 139  K 3.4* 4.2 3.7  CL 108 113* 113*  CO2 21* 21* 22  GLUCOSE 136* 149* 111*  BUN '15 12 10  '$ CREATININE 0.89 0.84 0.82  CALCIUM 9.8 9.4 9.4  MG  --  1.8  --   PHOS  --  3.2   --     Liver Function Tests: Recent Labs  Lab 07/06/22 1836 07/07/22 0830  AST 24 21  ALT 29 26  ALKPHOS 89 80  BILITOT 0.7 0.6  PROT 6.9 5.9*  ALBUMIN 2.8* 2.4*    CBG: Recent Labs  Lab 07/08/22 0950 07/08/22 1139 07/08/22 1431  GLUCAP 107* 116* 172*    Microbiology Studies:  No results found for this or any previous visit (from the past 240 hour(s)).  Radiology Studies:  No results found.  Scheduled Meds:    amLODipine  10 mg Oral Daily   aspirin EC  81 mg Oral Daily   atorvastatin  80 mg Oral Daily   ezetimibe  10 mg Oral Daily   FLUoxetine  20 mg Oral Daily   hydrALAZINE  10 mg Oral TID   insulin aspart  0-5 Units Subcutaneous QHS   insulin aspart  0-9 Units  Subcutaneous TID WC   mesalamine  1,000 mg Rectal Daily   mesalamine  1,000 mg Rectal QHS   omeprazole  20 mg Oral Daily   saccharomyces boulardii  250 mg Oral BID   ticagrelor  90 mg Oral BID    Continuous Infusions:       LOS: 2 days     Vernell Leep, MD,  FACP, FHM, SFHM, [facility], [place]     To contact the attending provider between 7A-7P or the covering provider during after hours 7P-7A, please log into the web site [email or web address] and access using universal [place] password for that web site. If you do not have the password, please call the hospital operator.  07/08/2022, 2:45 PM

## 2022-07-08 NOTE — TOC Transition Note (Signed)
Transition of Care [facility] - CM/SW Discharge Note   Patient Details  Name: [name] MRN: [id number] Date of Birth: [birth year]  [facility] Care [facility] CM/SW Contact:  Vinie Sill, LCSW Phone Number: 07/08/2022, 2:20 PM   Clinical Narrative:     CSW met with patient and her sister, Hassan Rowan at bedside. CSW introduced self and explained role. Patient confirmed she arrived form [facility]  and expects to return once stable. CSW explained the SNF process. Patient & family states no questions or concerns at this time.   TOC will continue to follow and assist with discharge planning.  Thurmond Butts, MSW, LCSW Clinical Social Worker    Final next level of care: Skilled Nursing Facility Barriers to Discharge: Continued Medical Work up, Ship broker   Patient Goals and CMS Choice      Discharge Placement                         Discharge Plan and Services Additional resources added to the After Visit Summary for   In-house Referral: Clinical Social Work                                   Social Determinants of Health (SDOH) Interventions SDOH Screenings   Food Insecurity: No Food Insecurity (05/29/2022)  Housing: Low Risk  (05/29/2022)  Transportation Needs: No Transportation Needs (05/29/2022)  Utilities: Not At Risk (05/29/2022)  Depression (PHQ2-9): Low Risk  (03/16/2022)  Tobacco Use: Low Risk  (07/08/2022)     Readmission Risk Interventions     No data to display

## 2022-07-08 NOTE — Progress Notes (Signed)
Patient finished first part of moviprep and started on 2nd one. Bm still brown but looks like it is starting to clear

## 2022-07-08 NOTE — Op Note (Signed)
[facility] Patient Name: [name] Procedure Date : 07/08/2022 MRN: [id number] Attending MD: Carlota Raspberry. Havery Moros , MD, [id number] Date of Birth: Nov 08, 1960 CSN: [id number] Age: 62 Admit Type: Inpatient Procedure:                Colonoscopy Indications:              Rectal bleeding - anemia, on Brilinta for history                            of CVA Providers:                Remo Lipps P. Havery Moros, MD, Altamese Cabal, RN,                            Benetta Spar, Technician Referring MD:              Medicines:                Monitored Anesthesia Care Complications:            No immediate complications. Estimated blood loss:                            Minimal. Estimated Blood Loss:     Estimated blood loss was minimal. Procedure:                Pre-Anesthesia Assessment:                           - Prior to the procedure, a History and Physical                            was performed, and patient medications and                            allergies were reviewed. The patient's tolerance of                            previous anesthesia was also reviewed. The risks                            and benefits of the procedure and the sedation                            options and risks were discussed with the patient.                            All questions were answered, and informed consent                            was obtained. Prior Anticoagulants: The patient has                            taken Brilinta (ticagrelor), last dose was 2 days  prior to procedure. ASA Grade Assessment: III - A                            patient with severe systemic disease. After                            reviewing the risks and benefits, the patient was                            deemed in satisfactory condition to undergo the                            procedure.                           After obtaining informed consent, the colonoscope                             was passed under direct vision. Throughout the                            procedure, the patient's blood pressure, pulse, and                            oxygen saturations were monitored continuously. The                            [id number]) Olympus peds colonoscope was                            introduced through the anus and advanced to the the                            ileocecal valve. The colonoscopy was technically                            difficult and complex due to significant looping.                            The patient tolerated the procedure well. The                            quality of the bowel preparation was adequate. The                            ileocecal valve and the rectum were photographed. Scope In: 8:49:23 AM Scope Out: 9:25:40 AM Total Procedure Duration: 0 hours 36 minutes 17 seconds  Findings:      The perianal and digital rectal examinations were normal.      A 3 mm polyp was found in the ascending colon. The polyp was sessile.      A 3 mm polyp was found in the descending colon. The polyp was sessile.      There was benign appearing polypoid change in the sigmoid colon, suspect  benign hyperplastic changes.      The colon was tortuous and cecal intubation was challenging. There was       restricted mobility in the hepatic flexure entering the right colon       which took some time, and could get to the IC valve; however, then       significant looping occured causing difficulty entering the cecal cap.       Multiple attempts with a variety of abdominal pressure and positional       changes but could not clear the cecal cap or see the AO.      A few ulcers were found in the distal rectum, largest was 49m or so. No       bleeding was present. No stigmata of recent bleeding were seen. Biopsies       were taken with a cold forceps for histology.      Internal hemorrhoids were found during retroflexion. The hemorrhoids        were small.      There was low anal tone, air retention in the left colon very difficult,       hard to visualize the rectum and keep it open with air. Retroflexed       views not possible in this light. The exam was otherwise without       abnormality. Impression:               - One 3 mm polyp in the ascending colon.                           - One 3 mm polyp in the descending colon.                           - Polypoid changes in the sigmoid colon, suspect                            benign / hyperplastic.                           - Tortuous colon - restricted in the right colon                            yet significant looping as well. IC valve reached                            but could not intubate the cecal cap                           - Poor anal tone, difficulty with insufflation of                            left colon / rectum                           - A few ulcers in the distal rectum. Biopsied to                            assess for IBD. This is the cause of her  bleeding                            symptoms                           - Internal hemorrhoids.                           - The examination was otherwise normal.                           Rectal ulceration could be post infectious vs.                            stercoral (no reported constipation - had diarrheal                            illness recently) vs. prolapse change (poor anal                            tone) vs. IBD Recommendation:           - Return patient to hospital ward for ongoing care.                           - Advance diet as tolerated.                           - Continue present medications.                           - No high risk stigmata for bleeding, I think okay                            to resume Brilinta                           - Await pathology results.                           - She may do best with sucralfate suppositories to                            heal the ulcerations  however I do not think                            available as inpatient (usually only available at                            compounding pharmacy), can get once outpatient, or                            Calmol4 suppositories OTC. Can try empiric Canasa                            suppositories daily  in the interim. Avoid                            constipation Procedure Code(s):        --- Professional ---                           [phone], Colonoscopy, flexible; with biopsy, single                            or multiple Diagnosis Code(s):        --- Professional ---                           D12.2, Benign neoplasm of ascending colon                           D12.4, Benign neoplasm of descending colon                           K62.6, Ulcer of anus and rectum                           K64.8, Other hemorrhoids                           K62.5, Hemorrhage of anus and rectum                           Q43.8, Other specified congenital malformations of                            intestine CPT copyright 2022 American Medical Association. All rights reserved. The codes documented in this report are preliminary and upon coder review may  be revised to meet current compliance requirements. Remo Lipps P. Swanson Farnell, MD 07/08/2022 9:44:25 AM This report has been signed electronically. Number of Addenda: 0

## 2022-07-08 NOTE — Plan of Care (Signed)
  Problem: Health Behavior/Discharge Planning: Goal: Ability to manage health-related needs will improve Outcome: Not Progressing   

## 2022-07-08 NOTE — Interval H&P Note (Signed)
History and Physical Interval Note: Here for colonoscopy today. Completed prep. Diagnostic exam to clarify source of rectal bleeding, anemia, while on Brilinta. I have discussed risks / benefits of the exam and anesthesia and she wishes to proceed. Further recommendations pending the results. All questions answered, she understands. Otherwise no interval changes since I have seen her.   07/08/2022 8:26 AM  [name]  has presented today for surgery, with the diagnosis of hematochezia.  The various methods of treatment have been discussed with the patient and family. After consideration of risks, benefits and other options for treatment, the patient has consented to  Procedure(s): COLONOSCOPY WITH PROPOFOL (N/A) as a surgical intervention.  The patient's history has been reviewed, patient examined, no change in status, stable for surgery.  I have reviewed the patient's chart and labs.  Questions were answered to the patient's satisfaction.     [place]

## 2022-07-09 ENCOUNTER — Inpatient Hospital Stay (HOSPITAL_COMMUNITY): Payer: Commercial Managed Care - HMO

## 2022-07-09 DIAGNOSIS — K626 Ulcer of anus and rectum: Secondary | ICD-10-CM

## 2022-07-09 DIAGNOSIS — K922 Gastrointestinal hemorrhage, unspecified: Secondary | ICD-10-CM | POA: Diagnosis not present

## 2022-07-09 LAB — GLUCOSE, CAPILLARY
Glucose-Capillary: 138 mg/dL — ABNORMAL HIGH (ref 70–99)
Glucose-Capillary: 151 mg/dL — ABNORMAL HIGH (ref 70–99)
Glucose-Capillary: 167 mg/dL — ABNORMAL HIGH (ref 70–99)
Glucose-Capillary: 209 mg/dL — ABNORMAL HIGH (ref 70–99)

## 2022-07-09 LAB — IRON AND TIBC
Iron: 57 ug/dL (ref 28–170)
Saturation Ratios: 25 % (ref 10.4–31.8)
TIBC: 228 ug/dL — ABNORMAL LOW (ref 250–450)
UIBC: 171 ug/dL

## 2022-07-09 LAB — CBC
HCT: 30.2 % — ABNORMAL LOW (ref 36.0–46.0)
Hemoglobin: 9.7 g/dL — ABNORMAL LOW (ref 12.0–15.0)
MCH: 25.1 pg — ABNORMAL LOW (ref 26.0–34.0)
MCHC: 32.1 g/dL (ref 30.0–36.0)
MCV: 78.2 fL — ABNORMAL LOW (ref 80.0–100.0)
Platelets: 282 10*3/uL (ref 150–400)
RBC: 3.86 MIL/uL — ABNORMAL LOW (ref 3.87–5.11)
RDW: 15.9 % — ABNORMAL HIGH (ref 11.5–15.5)
WBC: 7.1 10*3/uL (ref 4.0–10.5)
nRBC: 0 % (ref 0.0–0.2)

## 2022-07-09 LAB — FERRITIN: Ferritin: 439 ng/mL — ABNORMAL HIGH (ref 11–307)

## 2022-07-09 LAB — TROPONIN I (HIGH SENSITIVITY)
Troponin I (High Sensitivity): 21 ng/L — ABNORMAL HIGH (ref <18)
Troponin I (High Sensitivity): 20 ng/L — ABNORMAL HIGH (ref <18)

## 2022-07-09 MED ORDER — SIMETHICONE 80 MG PO CHEW
80.0000 mg | CHEWABLE_TABLET | Freq: Four times a day (QID) | ORAL | Status: AC
  Administered 2022-07-09 (×4): 80 mg via ORAL
  Filled 2022-07-09 (×4): qty 1

## 2022-07-09 NOTE — TOC Progression Note (Addendum)
Transition of Care [facility]) - Progression Note    Patient Details  Name: [name] MRN: [id number] Date of Birth: 11/30/1960  Transition of Care [facility]) CM/SW [place], LCSW Phone Number: 07/09/2022, 9:11 AM  Clinical Narrative:    9am-CSW notes patient may be ready for discharge. CSW awaiting response from [facility] with Madelynn Done on if patient requires insurance approval for return.   10:15am-Per [place], patient will require insurance authorization prior to return. She will begin process.   Expected Discharge Plan: [place] Barriers to Discharge: Continued Medical Work up, Ship broker  Expected Discharge Plan and Services In-house Referral: Clinical Social Work                                             Social Determinants of Health (SDOH) Interventions SDOH Screenings   Food Insecurity: No Food Insecurity (05/29/2022)  Housing: Low Risk  (05/29/2022)  Transportation Needs: No Transportation Needs (05/29/2022)  Utilities: Not At Risk (05/29/2022)  Depression (PHQ2-9): Low Risk  (03/16/2022)  Tobacco Use: Low Risk  (07/08/2022)    Readmission Risk Interventions     No data to display

## 2022-07-09 NOTE — NC FL2 (Addendum)
[place] LEVEL OF CARE FORM     IDENTIFICATION  Patient Name: [name] Birthdate: 15-Jan-1961 Sex: female Admission Date (Current Location): 07/06/2022  [facility] and [place] Number:  Herbalist and Address:  [facility], [place] [address] [place], [place] [address]      Provider Number: [id number]  Attending Physician Name and Address:  Modena Jansky, MD  Relative Name and Phone Number:       Current Level of Care: Hospital Recommended Level of Care: [place] Prior Approval Number:    Date Approved/Denied:   PASRR Number: [id number]  Discharge Plan: SNF    Current Diagnoses: Patient Active Problem List   Diagnosis Date Noted   Colon ulcer 07/08/2022   Rectal bleeding 07/07/2022   Anemia 07/07/2022   GI bleed 07/06/2022   Dysarthria 06/12/2022   Aspiration pneumonia ([place]) 06/12/2022   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   Anxiety disorder due to medical condition 06/10/2022   AKI (acute kidney injury) ([place]) 06/01/2022   Dizziness 05/29/2022   Personal history of CLL (chronic lymphocytic leukemia) 05/29/2022   Abdominal pain 05/29/2022   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 disease, with long-term current use of insulin ([place]) 04/14/2022   Type 2 diabetes mellitus with diabetic polyneuropathy, with long-term current use of insulin ([place]) 04/14/2022   Antiplatelet or antithrombotic long-term use 03/16/2022   Hyperlipidemia LDL goal <70 10/01/2021   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[place]) 09/17/2021   Stage 3a chronic kidney disease (CKD) ([place]) 09/16/2021   Thyroid nodule 09/16/2021   Cervical lymphadenopathy 09/16/2021   Acute CVA (cerebrovascular accident) ([place]) 09/15/2021   GERD (gastroesophageal reflux disease) 04/30/2021   Peripheral arterial disease ([place]) 04/13/2021   Atherosclerosis of aorta ([place]) 02/14/2021   Cerebral thrombosis with cerebral infarction 02/08/2021    Abnormal MRI of head 02/07/2021   Vertigo 02/07/2021   Class 1 obesity due to excess calories with body mass index (BMI) of 31.0 to 31.9 in adult 02/07/2021   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   Trigger finger, left ring finger 10/04/2018   Alternating constipation and diarrhea 04/20/2018   Diabetic peripheral neuropathy associated with type 2 diabetes mellitus ([place]) 02/26/2018   DM (diabetes mellitus), type 2 with peripheral vascular complications ([place]) [id number]   ABNORMALITY OF GAIT 05/10/2010   ECZEMA, ATOPIC 04/03/2009   Hyperlipidemia associated with type 2 diabetes mellitus ([place]) 03/06/2009   BACK PAIN 11/14/2008   Cerebrovascular disease 08/05/2008   DIPLOPIA 07/15/2008   Essential hypertension 07/15/2008    Orientation RESPIRATION BLADDER Height & Weight     Self, Time, Situation, Place  Normal Incontinent, External catheter Weight: 167 lb 12.3 oz (76.1 kg) Height:  '5\' 4"'$  (162.6 cm)  BEHAVIORAL SYMPTOMS/MOOD NEUROLOGICAL BOWEL NUTRITION STATUS      Incontinent Diet (See dc summary)  AMBULATORY STATUS COMMUNICATION OF NEEDS Skin   Extensive Assist Verbally Other (Comment) (bilateral non pressure wounds)                       Personal Care Assistance Level of Assistance  Bathing, Feeding, Dressing Bathing Assistance: Maximum assistance Feeding assistance: Limited assistance Dressing Assistance: Maximum assistance     Functional Limitations Info  Sight Sight Info: Impaired        SPECIAL CARE FACTORS FREQUENCY  PT (By licensed PT), OT (By licensed OT)     PT Frequency: Eval and treat OT Frequency: Eval and treat  Contractures Contractures Info: Not present    Additional Factors Info  Code Status, Allergies, Insulin Sliding Scale Code Status Info: Full Allergies Info: NKA   Insulin Sliding Scale Info: See dc summary       Current Medications (07/09/2022):  This is the current hospital active medication list Current  Facility-Administered Medications  Medication Dose Route Frequency Provider Last Rate Last Admin   acetaminophen (TYLENOL) tablet 650 mg  650 mg Oral Q6H PRN Irene Pap N, DO   650 mg at 07/07/22 0347   amLODipine (NORVASC) tablet 10 mg  10 mg Oral Daily Modena Jansky, MD   10 mg at 07/08/22 1643   aspirin EC tablet 81 mg  81 mg Oral Daily Modena Jansky, MD   81 mg at 07/08/22 1643   atorvastatin (LIPITOR) tablet 80 mg  80 mg Oral Daily Kayleen Memos, DO   80 mg at 07/08/22 1125   ezetimibe (ZETIA) tablet 10 mg  10 mg Oral Daily Irene Pap N, DO   10 mg at 07/08/22 1125   FLUoxetine (PROZAC) capsule 20 mg  20 mg Oral Daily Irene Pap N, DO   20 mg at 07/08/22 1125   hydrALAZINE (APRESOLINE) tablet 10 mg  10 mg Oral TID Kayleen Memos, DO   10 mg at 07/08/22 2220   insulin aspart (novoLOG) injection 0-5 Units  0-5 Units Subcutaneous QHS Hall, Carole N, DO       insulin aspart (novoLOG) injection 0-9 Units  0-9 Units Subcutaneous TID WC Irene Pap N, DO   2 Units at 07/09/22 [id number]   loperamide (IMODIUM) capsule 4 mg  4 mg Oral TID PRN Kayleen Memos, DO       melatonin tablet 5 mg  5 mg Oral QHS PRN Kayleen Memos, DO       mesalamine (CANASA) suppository 1,000 mg  1,000 mg Rectal Daily Armbruster, Carlota Raspberry, MD   1,000 mg at 07/08/22 1744   mesalamine (CANASA) suppository 1,000 mg  1,000 mg Rectal QHS Yetta Flock, MD       pantoprazole (PROTONIX) EC tablet 40 mg  40 mg Oral Daily Pierce, Dwayne A, RPH       prochlorperazine (COMPAZINE) injection 5 mg  5 mg Intravenous Q6H PRN Hall, Carole N, DO       saccharomyces boulardii (FLORASTOR) capsule 250 mg  250 mg Oral BID Hall, Carole N, DO   250 mg at 07/08/22 2220   simethicone (MYLICON) chewable tablet 80 mg  80 mg Oral QID Hongalgi, Lenis Dickinson, MD       ticagrelor (BRILINTA) tablet 90 mg  90 mg Oral BID Modena Jansky, MD   90 mg at 07/08/22 2221     Discharge Medications: Please see discharge summary for a list of  discharge medications.  Relevant Imaging Results:  Relevant Lab Results:   Additional Information SS#: [id number]  Benard [place]sted, LCSW

## 2022-07-09 NOTE — Progress Notes (Signed)
PROGRESS NOTE   [name]  [id number]    DOB: [birth year]    DOA: 07/06/2022  PCP: [name], [name], MD   I have briefly reviewed patients previous medical records in [facility].  Chief Complaint  Patient presents with   Rectal Bleeding    Brief Narrative:  62 year old female, prior to recent admission lives by herself and independent, medical history significant for multiple strokes in 2022, 2023 and 2024, HTN, HLD, type II DM/IDDM, PVD, non-Hodgkin's lymphoma, sickle cell trait, stage IIIa CKD, recent hospitalization 06/12/22 - 06/21/22 for acute extension of previous stroke, with new onset of left-sided hemiparesis, dysphagia and facial droop, aspiration pneumonitis versus pneumonia, acute diarrhea and discharged to SNF on aspirin 81 Mg daily, Brilinta 90 Mg twice daily, presented to the ED on 07/06/2022 with history of new onset of rectal bleeding from the afternoon of admission and reported ongoing diarrhea at [facility] since hospital discharge.  [facility] consulted.  Colonoscopy 3/8 showed few ulcers in the distal rectum amongst other findings, biopsied, likely the cause of her rectal bleeding.   Assessment & Plan:  Principal Problem:   GI bleed Active Problems:   Antiplatelet or antithrombotic long-term use   Rectal bleeding   Anemia   Colon ulcer   Rectal ulcer   Rectal bleeding due to rectal ulceration: Started at around 3 PM on 3/6.  Witnessed by patient's sister at SNF.  Holding aspirin and Brilinta (last dose on 3/6 at 9 AM and 8 AM respectively).  Hemodynamically stable.  Hemoglobin stable and at baseline compared to recent hospital discharge in the 10 g range.  Remains on IV Protonix but bleeding does not appear upper GI.   GI was consulted.  Colonoscopy 3/8 showed some polyps in the ascending, descending colon, few ulcers in the distal rectum, biopsied to assess for IBD and suspect this is the cause of her bleeding, internal hemorrhoids.  Suspecting that the  rectal ulceration could be postinfectious versus stercoral (although no reported constipation and had diarrheal illness recently) versus prolapse change versus IBD.  As communicated with GI, resumed Brilinta and aspirin, regular diet, Canasa suppositories, reassess in a.m. and if no further bleeding and remained stable then could possibly DC back to SNF with outpatient follow-up with GI with pathology results etc.  No BM since yesterday.  Hemoglobin overall stable.  As per GI, continue Canasa while hospitalized with plan to transition to sucralfate suppositories at discharge checking iron studies to see if she would benefit from IV iron for her anemia.  Atypical chest discomfort Patient reported that her chest was feeling like it was closing this morning.  Suspect due to abdominal gaseous distention from colonoscopy yesterday.  EKG personally reviewed and no acute changes.  HS Troponin 21 > 22.  Chest x-ray: Decreased RIGHT basilar atelectasis without other significant change or abnormality.  Added simethicone.  Monitor closely.  Chronic diarrhea: Reportedly has had since hospital discharge recently.  Unclear etiology.  Based on GI rectal exam, had small amount of soft light brown stool and dark red blood.  If has recurrent diarrhea may consider sending off for GI panel PCR.  Colonoscopy results as noted above.  Anemia of chronic disease: Hemoglobin stable in the 10 g range, same as at recent discharge.  Patient does not have acute blood loss anemia.  Hemoglobin has dropped slightly to 9.7.  No overt bleeding.  Follow CBC.  Hypokalemia Replaced.  Multiple strokes, residual left hemiparesis, dysarthria, facial asymmetry and ambulatory dysfunction  Continue home dose of Lipitor and Zetia.  PT and OT evaluated and recommend return to SNF.  Discussed with TOC regarding potential discharge to SNF this weekend.  Brilinta and aspirin which were briefly held but resumed again on 3/8.  Type II DM with  hyperglycemia/IDDM A1c 11.4 on 1/29.  Continue SSI.  Mildly uncontrolled and fluctuating.  No changes made.  Essential hypertension: Continue hydralazine 10 Mg 3 times daily.  Resumed home dose of amlodipine.  Dyslipidemia: Continue statins and Zetia.  GERD PPI.  Chronic anxiety/depression: Continue home Prozac.  Body mass index is 28.8 kg/m./Overweight    DVT prophylaxis: SCDs Start: 07/06/22 2017     Code Status: Full Code:  ACP Documents: None present Family Communication: None at bed side Disposition:  Monitor overnight for recurrent rectal bleeding.  If stable, possible DC to SNF on 3/10     Consultants:   [facility].  Procedures:   Colonoscopy 3/8  Antimicrobials:      Subjective:  Seen this morning.  Reported that her chest felt like it was "closing up".  Some abdominal fullness.  Not passing much gas.  No BM since yesterday.  No real chest pain.  Objective:   Vitals:   07/09/22 1040 07/09/22 1131 07/09/22 1227 07/09/22 1546  BP: (!) 168/82 (!) 168/82 (!) 159/78 (!) 150/83  Pulse:   68 72  Resp:   20 18  Temp:  98.3 F (36.8 C) 98.4 F (36.9 C) 98.4 F (36.9 C)  TempSrc:  Oral Oral Oral  SpO2:   98% 96%  Weight:      Height:        General exam: Middle-age female, moderately built and nourished, lying comfortably supine in bed and looks somewhat uncomfortable but not in painful distress. Respiratory system: Clear to auscultation.  No increased work of breathing. Cardiovascular system: S1 and S2 heard, RRR.  No JVD, murmurs or pedal edema.  Off of telemetry. Gastrointestinal system: Abdomen is nondistended, soft and nontender.  Normal bowel sounds heard. Central nervous system: Alert and oriented.  Facial asymmetry/diminished left nasal fold.  Dysarthria. Extremities: Right limbs graded 5 x 5 power.  Left limbs with grade 2 x 5 power. Skin: No rashes, lesions or ulcers Psychiatry: Judgement and insight appear normal. Mood & affect appropriate.      Data Reviewed:   I have personally reviewed following labs and imaging studies   CBC: Recent Labs  Lab 07/06/22 1836 07/06/22 2050 07/07/22 1414 07/08/22 0129 07/09/22 0645  WBC 8.1   < > 6.0 6.8 7.1  NEUTROABS 3.6  --   --   --   --   HGB 11.8*   < > 10.1* 10.3* 9.7*  HCT 36.6   < > 30.3* 31.2* 30.2*  MCV 77.5*   < > 76.3* 77.0* 78.2*  PLT 379   < > 283 303 282   < > = values in this interval not displayed.    Basic Metabolic Panel: Recent Labs  Lab 07/06/22 1836 07/07/22 0830 07/08/22 0129  NA 138 140 139  K 3.4* 4.2 3.7  CL 108 113* 113*  CO2 21* 21* 22  GLUCOSE 136* 149* 111*  BUN '15 12 10  '$ CREATININE 0.89 0.84 0.82  CALCIUM 9.8 9.4 9.4  MG  --  1.8  --   PHOS  --  3.2  --     Liver Function Tests: Recent Labs  Lab 07/06/22 1836 07/07/22 0830  AST 24 21  ALT 29  26  ALKPHOS 89 80  BILITOT 0.7 0.6  PROT 6.9 5.9*  ALBUMIN 2.8* 2.4*    CBG: Recent Labs  Lab 07/09/22 0601 07/09/22 1132 07/09/22 1639  GLUCAP 138* 151* 167*    Microbiology Studies:  No results found for this or any previous visit (from the past 240 hour(s)).  Radiology Studies:  DG CHEST PORT 1 VIEW  Result Date: 07/09/2022 CLINICAL DATA:  Chest pain EXAM: PORTABLE CHEST 1 VIEW COMPARISON:  06/13/2022 and prior studies FINDINGS: The cardiomediastinal silhouette is unremarkable. Decreased RIGHT basilar atelectasis and unchanged LEFT LOWER lung scarring. There is no evidence of focal airspace disease, pulmonary edema, suspicious pulmonary nodule/mass, pleural effusion, or pneumothorax. No acute bony abnormalities are identified. IMPRESSION: Decreased RIGHT basilar atelectasis without other significant change or abnormality. Electronically Signed   By: Margarette Canada M.D.   On: 07/09/2022 09:28    Scheduled Meds:    amLODipine  10 mg Oral Daily   aspirin EC  81 mg Oral Daily   atorvastatin  80 mg Oral Daily   ezetimibe  10 mg Oral Daily   FLUoxetine  20 mg Oral Daily    hydrALAZINE  10 mg Oral TID   insulin aspart  0-5 Units Subcutaneous QHS   insulin aspart  0-9 Units Subcutaneous TID WC   mesalamine  1,000 mg Rectal Daily   mesalamine  1,000 mg Rectal QHS   pantoprazole  40 mg Oral Daily   saccharomyces boulardii  250 mg Oral BID   simethicone  80 mg Oral QID   ticagrelor  90 mg Oral BID    Continuous Infusions:       LOS: 3 days     Vernell Leep, MD,  FACP, FHM, SFHM, [facility], [place]     To contact the attending provider between 7A-7P or the covering provider during after hours 7P-7A, please log into the web site [email or web address] and access using universal [place] password for that web site. If you do not have the password, please call the hospital operator.  07/09/2022, 5:09 PM

## 2022-07-09 NOTE — Progress Notes (Signed)
      Progress Note   Subjective  She has not had any rectal bleeding reported. She has been back on Brilinta. Hgb has drifted slightly. Complains of "heaviness" in her body, had EKG and CXR. Given simethicone for possible gas.   Objective   Vital signs in last 24 hours: Temp:  [98 F (36.7 C)-98.1 F (36.7 C)] 98 F (36.7 C) (03/09 0820) Pulse Rate:  [64-84] 64 (03/09 0820) Resp:  [18-20] 18 (03/09 0344) BP: (143-168)/(79-82) 168/82 (03/09 1040) SpO2:  [95 %-99 %] 99 % (03/09 0820) Last BM Date : 07/08/22 General:    AA female in NAD Neurologic:  Alert and oriented,  grossly normal neurologically. Psych:  Cooperative. Normal mood and affect.  Intake/Output from previous day: 03/08 0701 - 03/09 0700 In: 368.5 [I.V.:368.5] Out: 500 [Urine:500] Intake/Output this shift: No intake/output data recorded.  Lab Results: Recent Labs    07/07/22 1414 07/08/22 0129 07/09/22 0645  WBC 6.0 6.8 7.1  HGB 10.1* 10.3* 9.7*  HCT 30.3* 31.2* 30.2*  PLT 283 303 282   BMET Recent Labs    07/06/22 1836 07/07/22 0830 07/08/22 0129  NA 138 140 139  K 3.4* 4.2 3.7  CL 108 113* 113*  CO2 21* 21* 22  GLUCOSE 136* 149* 111*  BUN 15 12 10   CREATININE 0.89 0.84 0.82  CALCIUM 9.8 9.4 9.4   LFT Recent Labs    07/07/22 0830  PROT 5.9*  ALBUMIN 2.4*  AST 21  ALT 26  ALKPHOS 80  BILITOT 0.6   PT/INR Recent Labs    07/06/22 1859  LABPROT 13.1  INR 1.0    Studies/Results: No results found.     Assessment / Plan:    62 y/o female here with the following:  Rectal bleeding Anemia Antiplatelet use - Brilinta - in setting of recent CVA  Colonoscopy yesterday showed distal rectal ulceration. This is the cause of the rectal bleeding. DDx includes stercoral changes from constipation (she states she has that at times), prolapse (very low anal tone), infectious, vs. IBD. Biopsies taken and pending. Started empirically on Canasa, would continue that while in the hospital.  When she is outpatient, would transition to sulcralfate suppositories which may work better for this (although only available at compounding pharmacy, not inpatient). Okay to continue Brilinta for now, she is high risk for recurrent CVA. I sent iron studies, if deficiency noted would give IV iron to help her anemia. I can follow up pathology with her once outpatient.   RECOMMEND: - continue Brilinta, high risk for recurrent CVA - continue Canasa suppositories while inpatient - would transition to sulcralfate suppositories once outpatient (only available at compounding pharmacy), I will have our staff order her that Monday - check iron studies - if deficient would give a dose of IV iron - monitor for recurrent bleeding. Keep stools soft to prevent constipation, use miralax PRN.  We will sign off for now, please call with questions.  Jolly Mango, MD [facility]

## 2022-07-09 NOTE — Plan of Care (Signed)
  Problem: Activity: Goal: Risk for activity intolerance will decrease Outcome: Not Progressing   

## 2022-07-10 DIAGNOSIS — Z7902 Long term (current) use of antithrombotics/antiplatelets: Secondary | ICD-10-CM

## 2022-07-10 DIAGNOSIS — K625 Hemorrhage of anus and rectum: Secondary | ICD-10-CM

## 2022-07-10 DIAGNOSIS — K626 Ulcer of anus and rectum: Secondary | ICD-10-CM | POA: Diagnosis not present

## 2022-07-10 DIAGNOSIS — D649 Anemia, unspecified: Secondary | ICD-10-CM | POA: Diagnosis not present

## 2022-07-10 LAB — CBC
HCT: 28.6 % — ABNORMAL LOW (ref 36.0–46.0)
Hemoglobin: 9.5 g/dL — ABNORMAL LOW (ref 12.0–15.0)
MCH: 25.5 pg — ABNORMAL LOW (ref 26.0–34.0)
MCHC: 33.2 g/dL (ref 30.0–36.0)
MCV: 76.9 fL — ABNORMAL LOW (ref 80.0–100.0)
Platelets: 271 10*3/uL (ref 150–400)
RBC: 3.72 MIL/uL — ABNORMAL LOW (ref 3.87–5.11)
RDW: 15.8 % — ABNORMAL HIGH (ref 11.5–15.5)
WBC: 8 10*3/uL (ref 4.0–10.5)
nRBC: 0 % (ref 0.0–0.2)

## 2022-07-10 LAB — BASIC METABOLIC PANEL
Anion gap: 5 (ref 5–15)
BUN: 12 mg/dL (ref 8–23)
CO2: 22 mmol/L (ref 22–32)
Calcium: 9.2 mg/dL (ref 8.9–10.3)
Chloride: 112 mmol/L — ABNORMAL HIGH (ref 98–111)
Creatinine, Ser: 0.86 mg/dL (ref 0.44–1.00)
GFR, Estimated: >60 mL/min (ref >60)
Glucose, Bld: 162 mg/dL — ABNORMAL HIGH (ref 70–99)
Potassium: 3.2 mmol/L — ABNORMAL LOW (ref 3.5–5.1)
Sodium: 139 mmol/L (ref 135–145)

## 2022-07-10 LAB — GLUCOSE, CAPILLARY
Glucose-Capillary: 141 mg/dL — ABNORMAL HIGH (ref 70–99)
Glucose-Capillary: 184 mg/dL — ABNORMAL HIGH (ref 70–99)
Glucose-Capillary: 194 mg/dL — ABNORMAL HIGH (ref 70–99)
Glucose-Capillary: 225 mg/dL — ABNORMAL HIGH (ref 70–99)

## 2022-07-10 LAB — MAGNESIUM: Magnesium: 1.6 mg/dL — ABNORMAL LOW (ref 1.7–2.4)

## 2022-07-10 MED ORDER — POTASSIUM CHLORIDE CRYS ER 20 MEQ PO TBCR
40.0000 meq | EXTENDED_RELEASE_TABLET | Freq: Once | ORAL | Status: AC
  Administered 2022-07-10: 40 meq via ORAL
  Filled 2022-07-10: qty 2

## 2022-07-10 MED ORDER — MAGNESIUM SULFATE 2 GM/50ML IV SOLN
2.0000 g | Freq: Once | INTRAVENOUS | Status: AC
  Administered 2022-07-10: 2 g via INTRAVENOUS
  Filled 2022-07-10: qty 50

## 2022-07-10 MED ORDER — POTASSIUM CHLORIDE CRYS ER 20 MEQ PO TBCR
20.0000 meq | EXTENDED_RELEASE_TABLET | Freq: Once | ORAL | Status: AC
  Administered 2022-07-10: 20 meq via ORAL
  Filled 2022-07-10: qty 1

## 2022-07-10 NOTE — Progress Notes (Signed)
Mobility Specialist - Progress Note   07/10/22 1639  Mobility  Activity Transferred from bed to chair  Level of Assistance Moderate assist, patient does 50-74% (+2)  Assistive Device Stedy  Activity Response Tolerated well  $Mobility charge 1 Mobility    Pt received in bed requesting to get to chair. ModA +2 to scoot EOB and stand. Left in recliner w/ RN and NT present in room.   [facility] Specialist Please contact via SecureChat or Rehab office at [phone]

## 2022-07-10 NOTE — Progress Notes (Addendum)
PROGRESS NOTE   [name]  [id number]    DOB: 1961-02-17    DOA: 07/06/2022  PCP: [name], [name], MD   I have briefly reviewed patients previous medical records in [facility].  Chief Complaint  Patient presents with   Rectal Bleeding    Brief Narrative:  62 year old female, prior to recent admission lives by herself and independent, medical history significant for multiple strokes in 2022, 2023 and 2024, HTN, HLD, type II DM/IDDM, PVD, non-Hodgkin's lymphoma, sickle cell trait, stage IIIa CKD, recent hospitalization 06/12/22 - 06/21/22 for acute extension of previous stroke, with new onset of left-sided hemiparesis, dysphagia and facial droop, aspiration pneumonitis versus pneumonia, acute diarrhea and discharged to SNF on aspirin 81 Mg daily, Brilinta 90 Mg twice daily, presented to the ED on 07/06/2022 with history of new onset of rectal bleeding from the afternoon of admission and reported ongoing diarrhea at [facility] since hospital discharge.  [facility] consulted.  Colonoscopy 3/8 showed few ulcers in the distal rectum amongst other findings, biopsied, likely the cause of her rectal bleeding.  Medically stable for DC to SNF pending insurance authorization.   Assessment & Plan:  Principal Problem:   GI bleed Active Problems:   Antiplatelet or antithrombotic long-term use   Rectal bleeding   Anemia   Colon ulcer   Rectal ulcer   Rectal bleeding due to rectal ulceration: Started at around 3 PM on 3/6.  Witnessed by patient's sister at SNF.  Holding aspirin and Brilinta (last dose on 3/6 at 9 AM and 8 AM respectively).  Hemodynamically stable.  Hemoglobin stable and at baseline compared to recent hospital discharge in the 10 g range.  Remains on IV Protonix but bleeding does not appear upper GI.  [facility] was consulted.  Colonoscopy 3/8 showed some polyps in the ascending, descending colon, few ulcers in the distal rectum, biopsied to assess for IBD and suspect this is the cause  of her bleeding, internal hemorrhoids.  Suspecting that the rectal ulceration could be postinfectious versus stercoral (although no reported constipation and had diarrheal illness recently) versus prolapse change versus IBD.  As communicated with GI, resumed Brilinta and aspirin, regular diet, Canasa suppositories while in the hospital, sucralfate suppositories at discharge [facility] will assist with procuring as outpatient from compounding pharmacy), since no further bleeding, stable for DC back to SNF with outpatient follow-up with GI with pathology results etc.  No rectal bleeding in the last 48 hours.  Serum iron 57, ferritin 439.  Hemoglobin stable.  Atypical chest discomfort Patient reported that her chest was feeling like it was closing this morning.  Suspect due to abdominal gaseous distention from colonoscopy yesterday.  EKG personally reviewed and no acute changes.  HS Troponin 21 > 22.  Chest x-ray: Decreased RIGHT basilar atelectasis without other significant change or abnormality.  Added simethicone.  Appears to have resolved.  Chronic diarrhea: Reportedly has had since hospital discharge recently.  Unclear etiology.  Based on GI rectal exam, had small amount of soft light brown stool and dark red blood.  If has recurrent diarrhea may consider sending off for GI panel PCR.  Colonoscopy results as noted above.  Anemia of chronic disease: Hemoglobin stable in the 10 g range, same as at recent discharge.  Patient does not have acute blood loss anemia.  Hemoglobin has dropped slightly to 9.7 >9.5.  No overt bleeding.  Follow CBC.  Hypokalemia Replaced.  Follow BMP  Multiple strokes, residual left hemiparesis, dysarthria, facial  asymmetry and ambulatory dysfunction Continue home dose of Lipitor and Zetia.  PT and OT evaluated and recommend return to SNF.  Discussed with TOC regarding potential discharge to SNF this weekend, unable to go today due to pending insurance authorization.   Brilinta and aspirin which were briefly held but resumed again on 3/8.  Type II DM with hyperglycemia/IDDM A1c 11.4 on 1/29.  Continue SSI.  Mildly uncontrolled and fluctuating.  No changes made.  Essential hypertension: Continue hydralazine 10 Mg 3 times daily.  Resumed home dose of amlodipine.  Dyslipidemia: Continue statins and Zetia.  GERD PPI.  Chronic anxiety/depression: Continue home Prozac.  Body mass index is 28.8 kg/m./Overweight    DVT prophylaxis: SCDs Start: 07/06/22 2017     Code Status: Full Code:  ACP Documents: None present Family Communication: Sister via phone. Disposition:  Stable for DC to SNF pending insurance authorization.     Consultants:   [facility].  Procedures:   Colonoscopy 3/8  Antimicrobials:      Subjective:  No further chest discomfort.  Reports that the "gas medication" helped.  Indicates that she had a small BM but no bleeding.  Objective:   Vitals:   07/09/22 2330 07/10/22 0426 07/10/22 0826 07/10/22 1138  BP: (!) 146/63 (!) 163/67 (!) 156/72 (!) 148/72  Pulse:   88 78  Resp: '20 20 20 18  '$ Temp: 97.9 F (36.6 C) 97.6 F (36.4 C) (!) 97.3 F (36.3 C) 98.6 F (37 C)  TempSrc: Oral Oral Oral Oral  SpO2: 96% 96% 100% 97%  Weight:      Height:        General exam: Middle-age female, moderately built and nourished, lying comfortably supine in bed without distress.  Improved compared to yesterday. Respiratory system: Clear to auscultation.  No increased work of breathing. Cardiovascular system: S1 and S2 heard, RRR.  No JVD, murmurs or pedal edema.  Off of telemetry. Gastrointestinal system: Abdomen is nondistended, soft and nontender.  Normal bowel sounds heard. Central nervous system: Alert and oriented.  Facial asymmetry/diminished left nasal fold.  Dysarthria. Extremities: Right limbs graded 5 x 5 power.  Left limbs with grade 2 x 5 power. Skin: No rashes, lesions or ulcers Psychiatry: Judgement and insight appear  normal. Mood & affect appropriate.     Data Reviewed:   I have personally reviewed following labs and imaging studies   CBC: Recent Labs  Lab 07/06/22 1836 07/06/22 2050 07/08/22 0129 07/09/22 0645 07/10/22 0113  WBC 8.1   < > 6.8 7.1 8.0  NEUTROABS 3.6  --   --   --   --   HGB 11.8*   < > 10.3* 9.7* 9.5*  HCT 36.6   < > 31.2* 30.2* 28.6*  MCV 77.5*   < > 77.0* 78.2* 76.9*  PLT 379   < > 303 282 271   < > = values in this interval not displayed.    Basic Metabolic Panel: Recent Labs  Lab 07/06/22 1836 07/07/22 0830 07/08/22 0129 07/10/22 0113  NA 138 140 139 139  K 3.4* 4.2 3.7 3.2*  CL 108 113* 113* 112*  CO2 21* 21* 22 22  GLUCOSE 136* 149* 111* 162*  BUN '15 12 10 12  '$ CREATININE 0.89 0.84 0.82 0.86  CALCIUM 9.8 9.4 9.4 9.2  MG  --  1.8  --   --   PHOS  --  3.2  --   --     Liver Function Tests: Recent Labs  Lab 07/06/22 1836 07/07/22 0830  AST 24 21  ALT 29 26  ALKPHOS 89 80  BILITOT 0.7 0.6  PROT 6.9 5.9*  ALBUMIN 2.8* 2.4*    CBG: Recent Labs  Lab 07/09/22 2103 07/10/22 0618 07/10/22 1128  GLUCAP 209* 141* 184*    Microbiology Studies:  No results found for this or any previous visit (from the past 240 hour(s)).  Radiology Studies:  DG CHEST PORT 1 VIEW  Result Date: 07/09/2022 CLINICAL DATA:  Chest pain EXAM: PORTABLE CHEST 1 VIEW COMPARISON:  06/13/2022 and prior studies FINDINGS: The cardiomediastinal silhouette is unremarkable. Decreased RIGHT basilar atelectasis and unchanged LEFT LOWER lung scarring. There is no evidence of focal airspace disease, pulmonary edema, suspicious pulmonary nodule/mass, pleural effusion, or pneumothorax. No acute bony abnormalities are identified. IMPRESSION: Decreased RIGHT basilar atelectasis without other significant change or abnormality. Electronically Signed   By: Margarette Canada M.D.   On: 07/09/2022 09:28    Scheduled Meds:    amLODipine  10 mg Oral Daily   aspirin EC  81 mg Oral Daily    atorvastatin  80 mg Oral Daily   ezetimibe  10 mg Oral Daily   FLUoxetine  20 mg Oral Daily   hydrALAZINE  10 mg Oral TID   insulin aspart  0-5 Units Subcutaneous QHS   insulin aspart  0-9 Units Subcutaneous TID WC   mesalamine  1,000 mg Rectal Daily   mesalamine  1,000 mg Rectal QHS   pantoprazole  40 mg Oral Daily   saccharomyces boulardii  250 mg Oral BID   ticagrelor  90 mg Oral BID    Continuous Infusions:       LOS: 4 days     Vernell Leep, MD,  FACP, FHM, SFHM, [facility], [place]     To contact the attending provider between 7A-7P or the covering provider during after hours 7P-7A, please log into the web site [email or web address] and access using universal [place] password for that web site. If you do not have the password, please call the hospital operator.  07/10/2022, 2:12 PM

## 2022-07-11 ENCOUNTER — Encounter (HOSPITAL_COMMUNITY): Payer: Self-pay | Admitting: Gastroenterology

## 2022-07-11 ENCOUNTER — Telehealth: Payer: Self-pay

## 2022-07-11 DIAGNOSIS — K922 Gastrointestinal hemorrhage, unspecified: Secondary | ICD-10-CM | POA: Diagnosis not present

## 2022-07-11 LAB — BASIC METABOLIC PANEL WITH GFR
Anion gap: 7 (ref 5–15)
BUN: 10 mg/dL (ref 8–23)
CO2: 20 mmol/L — ABNORMAL LOW (ref 22–32)
Calcium: 9.3 mg/dL (ref 8.9–10.3)
Chloride: 111 mmol/L (ref 98–111)
Creatinine, Ser: 0.78 mg/dL (ref 0.44–1.00)
GFR, Estimated: >60 mL/min (ref >60)
Glucose, Bld: 210 mg/dL — ABNORMAL HIGH (ref 70–99)
Potassium: 3.9 mmol/L (ref 3.5–5.1)
Sodium: 138 mmol/L (ref 135–145)

## 2022-07-11 LAB — CBC
HCT: 30.5 % — ABNORMAL LOW (ref 36.0–46.0)
Hemoglobin: 10 g/dL — ABNORMAL LOW (ref 12.0–15.0)
MCH: 25.4 pg — ABNORMAL LOW (ref 26.0–34.0)
MCHC: 32.8 g/dL (ref 30.0–36.0)
MCV: 77.4 fL — ABNORMAL LOW (ref 80.0–100.0)
Platelets: 262 K/uL (ref 150–400)
RBC: 3.94 MIL/uL (ref 3.87–5.11)
RDW: 15.7 % — ABNORMAL HIGH (ref 11.5–15.5)
WBC: 8 K/uL (ref 4.0–10.5)
nRBC: 0 % (ref 0.0–0.2)

## 2022-07-11 LAB — GLUCOSE, CAPILLARY
Glucose-Capillary: 141 mg/dL — ABNORMAL HIGH (ref 70–99)
Glucose-Capillary: 212 mg/dL — ABNORMAL HIGH (ref 70–99)
Glucose-Capillary: 165 mg/dL — ABNORMAL HIGH (ref 70–99)

## 2022-07-11 LAB — MAGNESIUM: Magnesium: 2 mg/dL (ref 1.7–2.4)

## 2022-07-11 MED ORDER — INSULIN ASPART 100 UNIT/ML IJ SOLN: 0.0000 [IU] | Freq: Three times a day (TID) | INTRAMUSCULAR | Status: DC

## 2022-07-11 MED ORDER — INSULIN GLARGINE-YFGN 100 UNIT/ML ~~LOC~~ SOLN
5.0000 [IU] | Freq: Every day | SUBCUTANEOUS | Status: DC
  Administered 2022-07-11: 5 [IU] via SUBCUTANEOUS
  Filled 2022-07-11: qty 0.05

## 2022-07-11 MED ORDER — INSULIN GLARGINE-YFGN 100 UNIT/ML ~~LOC~~ SOLN: 5.0000 [IU] | Freq: Every day | SUBCUTANEOUS | Status: DC

## 2022-07-11 MED ORDER — AMBULATORY NON FORMULARY MEDICATION: 0 refills | Status: DC

## 2022-07-11 NOTE — Discharge Instructions (Signed)

## 2022-07-11 NOTE — Anesthesia Postprocedure Evaluation (Signed)
Anesthesia Post Note  Patient: [name]  Procedure(s) Performed: COLONOSCOPY WITH PROPOFOL BIOPSY     Patient location during evaluation: Endoscopy Anesthesia Type: MAC Level of consciousness: awake Pain management: pain level controlled Vital Signs Assessment: post-procedure vital signs reviewed and stable Respiratory status: spontaneous breathing, nonlabored ventilation and respiratory function stable Cardiovascular status: blood pressure returned to baseline and stable Postop Assessment: no apparent nausea or vomiting Anesthetic complications: no   No notable events documented.  Last Vitals:  Vitals:   07/11/22 0440 07/11/22 0809  BP: (!) 156/72 (!) 173/73  Pulse: 62 64  Resp: 20 18  Temp: 36.7 C 36.9 C  SpO2: 96% 98%    Last Pain:  Vitals:   07/11/22 0809  TempSrc: Oral  PainSc:                  Karyl Kinnier [place] Dunaway

## 2022-07-11 NOTE — Discharge Summary (Signed)
Physician Discharge Summary  [name] [id number] DOB: 1961/02/26  PCP: [name], [name], MD  Admitted from: SNF Discharged to: SNF  Admit date: 07/06/2022 Discharge date: 07/11/2022  Recommendations for Outpatient Follow-up:    Follow-up Information     MD at SNF. Schedule an appointment as soon as possible for a visit.   Why: To be seen in 5-7 days with repeat labs (CBC & BMP).        Yetta Flock, MD Follow up.   Specialty: Gastroenterology Why: As needed, If symptoms worsen.  MDs office will also arrange to follow-up on pathology results and office visit. Contact information: [place] [address] [phone]         [name], [name], MD. Schedule an appointment as soon as possible for a visit.   Specialty: Family Medicine Contact information: [place] [address] [phone]                  Home Health: None    Equipment/Devices: TBD at [facility]    Discharge Condition: Improved and stable.   Code Status: Full Code Diet recommendation:  Discharge Diet Orders (From admission, onward)     Start     Ordered   07/11/22 0000  Diet - low sodium heart healthy        07/11/22 1546   07/11/22 0000  Diet Carb Modified        07/11/22 1546             Discharge Diagnoses:  Principal Problem:   GI bleed Active Problems:   Antiplatelet or antithrombotic long-term use   Rectal bleeding   Anemia   Colon ulcer   Rectal ulcer   Brief Summary: 62 year old female, prior to recent admission lives by herself and independent, medical history significant for multiple strokes in 2022, 2023 and 2024, HTN, HLD, type II DM/IDDM, PVD, non-Hodgkin's lymphoma, sickle cell trait, stage IIIa CKD, recent hospitalization 06/12/22 - 06/21/22 for acute extension of previous stroke, with new onset of left-sided hemiparesis, dysphagia and facial droop, aspiration pneumonitis versus pneumonia, acute diarrhea and  discharged to SNF on aspirin 81 Mg daily, Brilinta 90 Mg twice daily, presented to the ED on 07/06/2022 with history of new onset of rectal bleeding from the afternoon of admission and reported ongoing diarrhea at [facility] since hospital discharge.  [facility] consulted.  Colonoscopy 3/8 showed distal rectal ulceration which was likely the cause of her rectal bleeding.     Assessment & Plan:    Rectal bleeding due to rectal ulceration: Started at around 3 PM on 3/6.  Hemodynamically stable.  Hemoglobin stable. [facility] was consulted.  Colonoscopy 3/8 showed distal rectal ulceration and as per GI this was the cause of the rectal bleeding.  They indicate differential diagnosis including stercoral changes from constipation which patient reported that she intermittently had, prolapse, infectious, versus IBD.  Biopsies taken and pending.  In the hospital, treated empirically with Canasa suppositories.  At time of discharge, transitioning to sucralfate suppositories which may work better for this.  [facility] arranging this as an outpatient through a Journalist, newspaper.  Brilinta and aspirin which were briefly held were resumed.  No recurrent GI bleeding.  Could use MiraLAX as needed at SNF to avoid constipation. Serum iron 57, ferritin 439.  Hemoglobin stable.  GI cleared patient for discharge.   Atypical chest discomfort Patient reported that her chest was feeling like it was closing this  morning.  Suspect due to abdominal gaseous distention from colonoscopy yesterday.  EKG personally reviewed and no acute changes.  HS Troponin 21 > 22.  Chest x-ray: Decreased RIGHT basilar atelectasis without other significant change or abnormality.  Added simethicone.  Appears to have resolved.   Chronic diarrhea: Reportedly has had since hospital discharge recently.  Unclear etiology.  Based on GI rectal exam, had small amount of soft light brown stool and dark red blood. Colonoscopy results as noted above.  No diarrhea  while hospitalized.   Anemia of chronic disease: Hemoglobin stable in the 10 g range, same as at recent discharge.  Patient does not have acute blood loss anemia.  Follow CBC at SNF periodically.   Hypokalemia Replaced.  Mg 2   Multiple strokes, residual left hemiparesis, dysarthria, facial asymmetry and ambulatory dysfunction Continue home dose of Lipitor and Zetia.  PT and OT evaluated and recommend return to SNF.  Brilinta and aspirin which were briefly held but resumed again on 3/8.   Type II DM with hyperglycemia/IDDM A1c 11.4 on 1/29.  Continue SSI.  Mildly uncontrolled and fluctuating.  DM coordinator input appreciated and added Semglee 5 units daily.  Monitor closely at SNF and adjust insulins as needed.   Essential hypertension: Continue prior to admission hydralazine 50 Mg 3 times daily and amlodipine.    Dyslipidemia: Continue statins and Zetia.   GERD PPI.   Chronic anxiety/depression: Continue home Prozac.   Body mass index is 28.8 kg/m./Overweight      Consultants:   [facility].   Procedures:   Colonoscopy 3/8  Discharge Instructions  Discharge Instructions     Call MD for:   Complete by: As directed    For recurrent rectal bleeding.   Call MD for:  difficulty breathing, headache or visual disturbances   Complete by: As directed    Call MD for:  extreme fatigue   Complete by: As directed    Call MD for:  persistant dizziness or light-headedness   Complete by: As directed    Call MD for:  persistant nausea and vomiting   Complete by: As directed    Call MD for:  severe uncontrolled pain   Complete by: As directed    Call MD for:  temperature >100.4   Complete by: As directed    Diet - low sodium heart healthy   Complete by: As directed    Diet Carb Modified   Complete by: As directed    Increase activity slowly   Complete by: As directed    No wound care   Complete by: As directed         Medication List     STOP taking these  medications    lidocaine 5 % Commonly known as: LIDODERM       TAKE these medications    acetaminophen 325 MG tablet Commonly known as: TYLENOL Take 650 mg by mouth every 4 (four) hours as needed for mild pain. Do not exceed '3000mg'$  in 24 hours   AMBULATORY NON FORMULARY MEDICATION Medication Name: Carafate 50 mg suppositories: Insert 1 suppository rectally twice a day for 2 weeks   amLODipine 10 MG tablet Commonly known as: NORVASC Take 1 tablet by mouth once daily   aspirin EC 81 MG tablet Take 1 tablet (81 mg total) by mouth daily. Swallow whole.   atorvastatin 80 MG tablet Commonly known as: Lipitor Take 1 tablet (80 mg total) by mouth daily.   Blood Pressure Monitor Automat Kerrin Mo  1 Device by Does not apply route daily.   ezetimibe 10 MG tablet Commonly known as: ZETIA Take 1 tablet (10 mg total) by mouth daily.   FLUoxetine 20 MG capsule Commonly known as: PROZAC Take 1 capsule by mouth once daily   glucose blood test strip Commonly known as: OneTouch Verio USE TO CHECK BLOOD SUGAR TWICE A DAY AND PRN   glucose blood test strip Commonly known as: Contour Next Test 1 each by Other route 2 (two) times daily. And lancets 2/day   hydrALAZINE 50 MG tablet Commonly known as: APRESOLINE TAKE 1 TABLET BY MOUTH THREE TIMES DAILY   insulin aspart 100 UNIT/ML injection Commonly known as: novoLOG Inject 0-9 Units into the skin 3 (three) times daily with meals. 0-9 Units, Subcutaneous, 3 times daily with meals, First dose on Thu 07/07/22 at 0800 Correction coverage: Sensitive (thin, NPO, renal) CBG < 70: Implement Hypoglycemia Standing Orders and refer to Hypoglycemia Standing Orders sidebar report CBG 70 - 120: 0 units CBG 121 - 150: 1 unit CBG 151 - 200: 2 units CBG 201 - 250: 3 units CBG 251 - 300: 5 units CBG 301 - 350: 7 units CBG 351 - 400: 9 units CBG > 400: call MD. What changed:  how much to take additional instructions   insulin glargine-yfgn 100 UNIT/ML  injection Commonly known as: SEMGLEE Inject 0.05 mLs (5 Units total) into the skin daily. Start taking on: July 12, 2022   loperamide 2 MG capsule Commonly known as: IMODIUM Take 2 capsules (4 mg total) by mouth 3 (three) times daily as needed for diarrhea or loose stools.   omeprazole 20 MG tablet Commonly known as: PRILOSEC OTC Take 20 mg by mouth daily.   ONE TOUCH LANCETS Misc USE TO CHECK BLOOD SUGAR TWICE A DAY AND PRN   saccharomyces boulardii 250 MG capsule Commonly known as: FLORASTOR Take 1 capsule (250 mg total) by mouth 2 (two) times daily.   ticagrelor 90 MG Tabs tablet Commonly known as: BRILINTA Take 1 tablet (90 mg total) by mouth 2 (two) times daily.   Trulicity [id number] [id number] Sopn Generic drug: Dulaglutide Inject 0.75 mg into the skin once a week. What changed: additional instructions   Vitamin B Complex Tabs Take 1 tablet by mouth every morning.   VITAMIN D3 PO Take 1 capsule by mouth every morning.       No Known Allergies    Procedures/Studies: DG CHEST PORT 1 VIEW  Result Date: 07/09/2022 CLINICAL DATA:  Chest pain EXAM: PORTABLE CHEST 1 VIEW COMPARISON:  06/13/2022 and prior studies FINDINGS: The cardiomediastinal silhouette is unremarkable. Decreased RIGHT basilar atelectasis and unchanged LEFT LOWER lung scarring. There is no evidence of focal airspace disease, pulmonary edema, suspicious pulmonary nodule/mass, pleural effusion, or pneumothorax. No acute bony abnormalities are identified. IMPRESSION: Decreased RIGHT basilar atelectasis without other significant change or abnormality. Electronically Signed   By: Margarette Canada M.D.   On: 07/09/2022 09:28    Subjective: States that she was up in reclining chair with therapy assistance and the left yesterday.  There she had a large BM without blood.  She almost slid off the chair. Denies complaints.  No pain reported.   Discharge Exam:  Vitals:   07/10/22 2340 07/11/22 0440 07/11/22 0809  07/11/22 1137  BP: (!) 150/67 (!) 156/72 (!) 173/73 (!) 159/79  Pulse: 65 62 64 72  Resp: '20 20 18 16  '$ Temp: 97.6 F (36.4 C) 98 F (36.7 C) 98.5 F (36.9  C) 98.4 F (36.9 C)  TempSrc: Axillary Oral Oral Oral  SpO2: 100% 96% 98% 92%  Weight:      Height:        General exam: Middle-age female, moderately built and nourished, lying comfortably supine in bed without distress.  Respiratory system: Clear to auscultation.  No increased work of breathing. Cardiovascular system: S1 and S2 heard, RRR.  No JVD, murmurs or pedal edema.  Off of telemetry. Gastrointestinal system: Abdomen is nondistended, soft and nontender.  Normal bowel sounds heard.  Stable without change. Central nervous system: Alert and oriented.  Facial asymmetry/diminished left nasal fold.  Dysarthria. Extremities: Right limbs graded 5 x 5 power.  Left limbs with grade 2 x 5 power. Skin: No rashes, lesions or ulcers Psychiatry: Judgement and insight appear normal. Mood & affect appropriate.     The results of significant diagnostics from this hospitalization (including imaging, microbiology, ancillary and laboratory) are listed below for reference.     Microbiology: No results found for this or any previous visit (from the past 240 hour(s)).   Labs: CBC: Recent Labs  Lab 07/06/22 1836 07/06/22 2050 07/07/22 1414 07/08/22 0129 07/09/22 0645 07/10/22 0113 07/11/22 0051  WBC 8.1   < > 6.0 6.8 7.1 8.0 8.0  NEUTROABS 3.6  --   --   --   --   --   --   HGB 11.8*   < > 10.1* 10.3* 9.7* 9.5* 10.0*  HCT 36.6   < > 30.3* 31.2* 30.2* 28.6* 30.5*  MCV 77.5*   < > 76.3* 77.0* 78.2* 76.9* 77.4*  PLT 379   < > 283 303 282 271 262   < > = values in this interval not displayed.    Basic Metabolic Panel: Recent Labs  Lab 07/06/22 1836 07/07/22 0830 07/08/22 0129 07/10/22 0113 07/11/22 0051  NA 138 140 139 139 138  K 3.4* 4.2 3.7 3.2* 3.9  CL 108 113* 113* 112* 111  CO2 21* 21* 22 22 20*  GLUCOSE 136* 149*  111* 162* 210*  BUN '15 12 10 12 10  '$ CREATININE 0.89 0.84 0.82 0.86 0.78  CALCIUM 9.8 9.4 9.4 9.2 9.3  MG  --  1.8  --  1.6* 2.0  PHOS  --  3.2  --   --   --     Liver Function Tests: Recent Labs  Lab 07/06/22 1836 07/07/22 0830  AST 24 21  ALT 29 26  ALKPHOS 89 80  BILITOT 0.7 0.6  PROT 6.9 5.9*  ALBUMIN 2.8* 2.4*    CBG: Recent Labs  Lab 07/10/22 1128 07/10/22 1640 07/10/22 2025 07/11/22 0609 07/11/22 1117  GLUCAP 184* 194* 225* 141* 212*   Anemia work up Recent Labs    07/09/22 1050  FERRITIN 439*  TIBC 228*  IRON 57     Time coordinating discharge: 25 minutes  SIGNED:  Vernell Leep, MD,  FACP, [place], [place], [facility], [place]     To contact the attending provider between 7A-7P or the covering provider during after hours 7P-7A, please log into the web site [email or web address] and access using universal  password for that web site. If you do not have the password, please call the hospital operator.

## 2022-07-11 NOTE — Progress Notes (Signed)
PROGRESS NOTE   [name]  [id number]    DOB: Sep 28, 1960    DOA: 07/06/2022  PCP: [name], [name], MD   I have briefly reviewed patients previous medical records in [facility].  Chief Complaint  Patient presents with   Rectal Bleeding    Brief Narrative:  62 year old female, prior to recent admission lives by herself and independent, medical history significant for multiple strokes in 2022, 2023 and 2024, HTN, HLD, type II DM/IDDM, PVD, non-Hodgkin's lymphoma, sickle cell trait, stage IIIa CKD, recent hospitalization 06/12/22 - 06/21/22 for acute extension of previous stroke, with new onset of left-sided hemiparesis, dysphagia and facial droop, aspiration pneumonitis versus pneumonia, acute diarrhea and discharged to SNF on aspirin 81 Mg daily, Brilinta 90 Mg twice daily, presented to the ED on 07/06/2022 with history of new onset of rectal bleeding from the afternoon of admission and reported ongoing diarrhea at [facility] since hospital discharge.  [facility] consulted.  Colonoscopy 3/8 showed few ulcers in the distal rectum amongst other findings, biopsied, likely the cause of her rectal bleeding.  Medically stable for DC to SNF pending insurance authorization.   Assessment & Plan:  Principal Problem:   GI bleed Active Problems:   Antiplatelet or antithrombotic long-term use   Rectal bleeding   Anemia   Colon ulcer   Rectal ulcer   Rectal bleeding due to rectal ulceration: Started at around 3 PM on 3/6.  Witnessed by patient's sister at SNF.  Holding aspirin and Brilinta (last dose on 3/6 at 9 AM and 8 AM respectively).  Hemodynamically stable.  Hemoglobin stable and at baseline compared to recent hospital discharge in the 10 g range.  Remains on IV Protonix but bleeding does not appear upper GI.  [facility] was consulted.  Colonoscopy 3/8 showed some polyps in the ascending, descending colon, few ulcers in the distal rectum, biopsied to assess for IBD and suspect this is the cause  of her bleeding, internal hemorrhoids.  Suspecting that the rectal ulceration could be postinfectious versus stercoral (although no reported constipation and had diarrheal illness recently) versus prolapse change versus IBD.  As communicated with GI, resumed Brilinta and aspirin, regular diet, Canasa suppositories while in the hospital, sucralfate suppositories at discharge [facility] will assist with procuring as outpatient from compounding pharmacy), since no further bleeding, stable for DC back to SNF with outpatient follow-up with GI with pathology results etc.  No rectal bleeding in the last 78 hours.  Serum iron 57, ferritin 439.  Hemoglobin stable.  Atypical chest discomfort Patient reported that her chest was feeling like it was closing this morning.  Suspect due to abdominal gaseous distention from colonoscopy yesterday.  EKG personally reviewed and no acute changes.  HS Troponin 21 > 22.  Chest x-ray: Decreased RIGHT basilar atelectasis without other significant change or abnormality.  Added simethicone.  Appears to have resolved.  Chronic diarrhea: Reportedly has had since hospital discharge recently.  Unclear etiology.  Based on GI rectal exam, had small amount of soft light brown stool and dark red blood.  If has recurrent diarrhea may consider sending off for GI panel PCR.  Colonoscopy results as noted above.  Anemia of chronic disease: Hemoglobin stable in the 10 g range, same as at recent discharge.  Patient does not have acute blood loss anemia.  Hemoglobin has dropped slightly to 9.7 >9.5 >10.  No overt bleeding.  Follow CBC at SNF periodically.  Hypokalemia Replaced.  Mg 2  Multiple strokes, residual  left hemiparesis, dysarthria, facial asymmetry and ambulatory dysfunction Continue home dose of Lipitor and Zetia.  PT and OT evaluated and recommend return to SNF.  Discussed with TOC regarding potential discharge to SNF this weekend, unable to go today due to pending insurance  authorization.  Brilinta and aspirin which were briefly held but resumed again on 3/8.  Type II DM with hyperglycemia/IDDM A1c 11.4 on 1/29.  Continue SSI.  Mildly uncontrolled and fluctuating.  DM coordinator input appreciated and added Semglee 5 units daily.  Essential hypertension: Continue hydralazine 10 Mg 3 times daily.  Resumed home dose of amlodipine.  Mildly uncontrolled and fluctuating at times.  Dyslipidemia: Continue statins and Zetia.  GERD PPI.  Chronic anxiety/depression: Continue home Prozac.  Body mass index is 28.8 kg/m./Overweight    DVT prophylaxis: SCDs Start: 07/06/22 2017     Code Status: Full Code:  ACP Documents: None present Family Communication: None at bedside. Disposition:  Stable for DC to SNF pending insurance authorization.  Discussed with TOC.     Consultants:   [facility].  Procedures:   Colonoscopy 3/8  Antimicrobials:      Subjective:  States that she was up in reclining chair with therapy assistance and the left yesterday.  There she had a large BM without blood.  She almost slid off the chair.  Denies complaints.  No pain reported.  Objective:   Vitals:   07/10/22 2340 07/11/22 0440 07/11/22 0809 07/11/22 1137  BP: (!) 150/67 (!) 156/72 (!) 173/73 (!) 159/79  Pulse: 65 62 64 72  Resp: '20 20 18 16  '$ Temp: 97.6 F (36.4 C) 98 F (36.7 C) 98.5 F (36.9 C) 98.4 F (36.9 C)  TempSrc: Axillary Oral Oral Oral  SpO2: 100% 96% 98% 92%  Weight:      Height:        General exam: Middle-age female, moderately built and nourished, lying comfortably supine in bed without distress.  Respiratory system: Clear to auscultation.  No increased work of breathing. Cardiovascular system: S1 and S2 heard, RRR.  No JVD, murmurs or pedal edema.  Off of telemetry. Gastrointestinal system: Abdomen is nondistended, soft and nontender.  Normal bowel sounds heard.  Stable without change. Central nervous system: Alert and oriented.  Facial  asymmetry/diminished left nasal fold.  Dysarthria. Extremities: Right limbs graded 5 x 5 power.  Left limbs with grade 2 x 5 power. Skin: No rashes, lesions or ulcers Psychiatry: Judgement and insight appear normal. Mood & affect appropriate.     Data Reviewed:   I have personally reviewed following labs and imaging studies   CBC: Recent Labs  Lab 07/06/22 1836 07/06/22 2050 07/09/22 0645 07/10/22 0113 07/11/22 0051  WBC 8.1   < > 7.1 8.0 8.0  NEUTROABS 3.6  --   --   --   --   HGB 11.8*   < > 9.7* 9.5* 10.0*  HCT 36.6   < > 30.2* 28.6* 30.5*  MCV 77.5*   < > 78.2* 76.9* 77.4*  PLT 379   < > 282 271 262   < > = values in this interval not displayed.    Basic Metabolic Panel: Recent Labs  Lab 07/06/22 1836 07/07/22 0830 07/08/22 0129 07/10/22 0113 07/11/22 0051  NA 138 140 139 139 138  K 3.4* 4.2 3.7 3.2* 3.9  CL 108 113* 113* 112* 111  CO2 21* 21* 22 22 20*  GLUCOSE 136* 149* 111* 162* 210*  BUN '15 12 10 12 '$ 10  CREATININE 0.89 0.84 0.82 0.86 0.78  CALCIUM 9.8 9.4 9.4 9.2 9.3  MG  --  1.8  --  1.6* 2.0  PHOS  --  3.2  --   --   --     Liver Function Tests: Recent Labs  Lab 07/06/22 1836 07/07/22 0830  AST 24 21  ALT 29 26  ALKPHOS 89 80  BILITOT 0.7 0.6  PROT 6.9 5.9*  ALBUMIN 2.8* 2.4*    CBG: Recent Labs  Lab 07/10/22 2025 07/11/22 0609 07/11/22 1117  GLUCAP 225* 141* 212*    Microbiology Studies:  No results found for this or any previous visit (from the past 240 hour(s)).  Radiology Studies:  No results found.  Scheduled Meds:    amLODipine  10 mg Oral Daily   aspirin EC  81 mg Oral Daily   atorvastatin  80 mg Oral Daily   ezetimibe  10 mg Oral Daily   FLUoxetine  20 mg Oral Daily   hydrALAZINE  10 mg Oral TID   insulin aspart  0-5 Units Subcutaneous QHS   insulin aspart  0-9 Units Subcutaneous TID WC   mesalamine  1,000 mg Rectal Daily   mesalamine  1,000 mg Rectal QHS   pantoprazole  40 mg Oral Daily   saccharomyces  boulardii  250 mg Oral BID   ticagrelor  90 mg Oral BID    Continuous Infusions:       LOS: 5 days     Vernell Leep, MD,  FACP, FHM, SFHM, [facility] [place], [place]     To contact the attending provider between 7A-7P or the covering provider during after hours 7P-7A, please log into the web site [email or web address] and access using universal [place] password for that web site. If you do not have the password, please call the hospital operator.  07/11/2022, 2:08 PM

## 2022-07-11 NOTE — Telephone Encounter (Signed)
-----   Message from Yetta Flock, MD sent at 07/09/2022 11:10 AM EST ----- Regarding: sucralfate suppositories Jan can you help order sulcralfate suppositories for this patient from compounding pharmacy, 1 suppository twice daily for 2 weeks? She should be discharged early next week. Thank you!  Dr. Loni Muse

## 2022-07-11 NOTE — Telephone Encounter (Signed)
Order placed for 50 mg Carafate suppositories to be used BID for 2 weeks to [employer] [facility] [employer]. Patient still admitted as of today

## 2022-07-11 NOTE — Progress Notes (Signed)
Called [facility] place to give report several time, call received and hung up without having communication,this RN tried another attempt but phone was not answered

## 2022-07-11 NOTE — Progress Notes (Signed)
Inpatient Diabetes Program Recommendations  AACE/ADA: New Consensus Statement on Inpatient Glycemic Control (2015)  Target Ranges:  Prepandial:   less than 140 mg/dL      Peak postprandial:   less than 180 mg/dL (1-2 hours)      Critically ill patients:  140 - 180 mg/dL   Lab Results  Component Value Date   GLUCAP 212 (H) 07/11/2022   HGBA1C 11.4 (H) 05/30/2022    Review of Glycemic Control  Latest Reference Range & Units 07/09/22 11:32 07/09/22 16:39 07/09/22 21:03 07/10/22 06:18 07/10/22 11:28 07/10/22 16:40 07/10/22 20:25 07/11/22 06:09 07/11/22 11:17  Glucose-Capillary 70 - 99 mg/dL 151 (H) 167 (H) 209 (H) 141 (H) 184 (H) 194 (H) 225 (H) 141 (H) 212 (H)   Diabetes history: DM  Outpatient Diabetes medications:  Trulicity A999333 mg weekly Novolog 0-15 units tid with meals Current orders for Inpatient glycemic control:  Novolog sensitive tid with meals and HS  Inpatient Diabetes Program Recommendations:    May consider increasing Novolog correction to moderate tid with meals and HS.  May also benefit from low dose basal insulin such as Semglee 5 units daily.   Thanks,  Adah Perl, RN, BC-ADM Inpatient Diabetes Coordinator Pager [phone]  (8a-5p)

## 2022-07-11 NOTE — Progress Notes (Signed)
Physical Therapy Treatment Patient Details Name: [name] MRN: [id number] DOB: 02-Mar-1961 Today's Date: 07/11/2022   History of Present Illness Pt is a 62 y/o female presenting with rectal bleed. Recent admission 2/11-2/20 from inpatient rehab with CVA extension. PMH: multiple strokes in 2022, 2023 and 2024, HTN, HLD, IDDM, PVD, non-Hodgkin's lymphoma, sickle cell trait, CKD stage IIIa    PT Comments    Pt making progress towards goals, requiring decreased assist for bed mobility and to come to standing in [place]. Pt also able to increase standing tolerance and weightshifting. Pt scheduled to return to [facility] this evening to resume appropriate rehabilitation.      Recommendations for follow up therapy are one component of a multi-disciplinary discharge planning process, led by the attending physician.  Recommendations may be updated based on patient status, additional functional criteria and insurance authorization.  Follow Up Recommendations  Skilled nursing-short term rehab (<3 hours/day) Can patient physically be transported by private vehicle: No   Assistance Recommended at Discharge Frequent or constant Supervision/Assistance  Patient can return home with the following Two people to help with walking and/or transfers;Two people to help with bathing/dressing/bathroom;Assistance with cooking/housework;Assistance with feeding;Direct supervision/assist for medications management;Direct supervision/assist for financial management;Assist for transportation;Help with stairs or ramp for entrance   Equipment Recommendations  Wheelchair (measurements PT);Wheelchair cushion (measurements PT)       Precautions / Restrictions Precautions Precautions: Fall Precaution Comments: Left hemiparesis Restrictions Weight Bearing Restrictions: No     Mobility  Bed Mobility Overal bed mobility: Needs Assistance Bed Mobility: Supine to Sit     Supine to sit: Max assist, +2 for physical  assistance, +2 for safety/equipment, HOB elevated     General bed mobility comments: Max A x 2 with assist needed to advance LLE and lift trunk. good effort of RLE and using R UE on bedrail    Transfers Overall transfer level: Needs assistance Equipment used: Ambulation equipment used, 2 person hand held assist Transfers: Sit to/from Stand, Bed to chair/wheelchair/BSC Sit to Stand: Mod assist, Max assist, +2 physical assistance, +2 safety/equipment           General transfer comment: Able to stand in [place] with Max A x 2 for initial stand from bed. and assist to reach L UE to [facility] rail. modAx2 for coming to standing x2 from Stedy pads to work on Clinical cytogeneticist: [employer] Overall balance assessment: Needs assistance Sitting-balance support: Single extremity supported, Feet supported Sitting balance-Leahy Scale: Poor Sitting balance - Comments: Overall Min A to Mod A with R lateral lean d/t fatigue Postural control: Right lateral lean Standing balance support: Single extremity supported Standing balance-Leahy Scale: Poor Standing balance comment: reliant on Stedy to block knees                            Cognition Arousal/Alertness: Awake/alert Behavior During Therapy: WFL for tasks assessed/performed Overall Cognitive Status: Impaired/Different from baseline Area of Impairment: Problem solving                             Problem Solving: Slow processing, Decreased initiation, Requires verbal cues, Requires tactile cues, Difficulty sequencing General Comments: pleasant and cooperative, increased time to follow directions. Does have awareness into deficits and appropriate in all interactions        Exercises Other Exercises Other Exercises: standing balance ~2-3 min  working on finding midline, weight shift to L    General Comments  Pt looking forward to return to rehab      Pertinent Vitals/Pain Pain  Assessment Pain Assessment: Faces Faces Pain Scale: Hurts a little bit Pain Location: bottom Pain Descriptors / Indicators: Sore Pain Intervention(s): Limited activity within patient's tolerance, Monitored during session, Repositioned     PT Goals (current goals can now be found in the care plan section) Acute Rehab PT Goals Patient Stated Goal: to get better PT Goal Formulation: With patient Time For Goal Achievement: 07/21/22 Potential to Achieve Goals: Good Progress towards PT goals: Progressing toward goals    Frequency    Min 3X/week      PT Plan Current plan remains appropriate       AM-PAC PT "6 Clicks" Mobility   Outcome Measure  Help needed turning from your back to your side while in a flat bed without using bedrails?: A Lot Help needed moving from lying on your back to sitting on the side of a flat bed without using bedrails?: Total Help needed moving to and from a bed to a chair (including a wheelchair)?: Total Help needed standing up from a chair using your arms (e.g., wheelchair or bedside chair)?: Total Help needed to walk in hospital room?: Total Help needed climbing 3-5 steps with a railing? : Total 6 Click Score: 7    End of Session Equipment Utilized During Treatment: Gait belt Activity Tolerance: Patient tolerated treatment well Patient left: with call bell/phone within reach;in chair;with chair alarm set Nurse Communication: Mobility status;Need for lift equipment PT Visit Diagnosis: Hemiplegia and hemiparesis;Difficulty in walking, not elsewhere classified (R26.2) Hemiplegia - Right/Left: Left Hemiplegia - dominant/non-dominant: Non-dominant Hemiplegia - caused by: Cerebral infarction     Time: [id number] PT Time Calculation (min) (ACUTE ONLY): 36 min  Charges:  $Therapeutic Exercise: 8-22 mins $Therapeutic Activity: 8-22 mins                     Jermall [name] B. Migdalia Dk PT, DPT Acute Rehabilitation Services Please use secure chat or   Call Office [phone]    Cloud 07/11/2022, 4:40 PM

## 2022-07-11 NOTE — TOC Transition Note (Signed)
Transition of Care [facility]) - CM/SW Discharge Note   Patient Details  Name: [name] MRN: [id number] Date of Birth: 08/22/1960  [facility] CM/SW Contact:  Loreta [address], [place] Phone Number: 07/11/2022, 4:15 PM   Clinical Narrative:     Patient will DC [id number] Place Anticipated DC date: 07/11/22 Family notified: sister-Brenda Transport by: Corey Harold   Per MD patient ready for DC to [facility]. RN to call report prior to discharge. RN, patient, patient's family, and facility notified of DC. Discharge Summary and FL2 sent to facility. DC packet on chart. Ambulance transport requested for patient.   CSW will sign off for now as social work intervention is no longer needed. Please consult [place] again if new needs arise.    Final next level of care: [place] Barriers to Discharge: Continued Medical Work up, Ship broker   Patient Goals and CMS Choice      Discharge Placement                         Discharge Plan and Services Additional resources added to the After Visit Summary for   In-house Referral: Clinical Social Work                                   Social Determinants of Health (SDOH) Interventions SDOH Screenings   Food Insecurity: No Food Insecurity (05/29/2022)  Housing: Low Risk  (05/29/2022)  Transportation Needs: No Transportation Needs (05/29/2022)  Utilities: Not At Risk (05/29/2022)  Depression (PHQ2-9): Low Risk  (03/16/2022)  Tobacco Use: Low Risk  (07/08/2022)     Readmission Risk Interventions     No data to display

## 2022-07-12 ENCOUNTER — Telehealth: Payer: Self-pay | Admitting: Family Medicine

## 2022-07-12 LAB — SURGICAL PATHOLOGY

## 2022-07-12 NOTE — Telephone Encounter (Signed)
I left [name] a message letting her know that patient was discharged to SNF. Per hospital discharge papers:  Schedule an appointment with MD at SNF; To be seen in 5-7 days with repeat labs (CBC & BMP). Follow up with Armbruster, Carlota Raspberry, MD (Gastroenterology); As needed, If symptoms worsen.  MDs office will also arrange to follow-up on pathology results and office visit. Schedule an appointment with [name], [name], MD (Family Medicine)  I advised in my message that we have not seen patient yet as she was discharged to SNF.

## 2022-07-12 NOTE — Telephone Encounter (Signed)
[facility] case management with [facility] is calling the pt was in hospital and she would like a callback to discuss pt treatment plan

## 2022-07-20 ENCOUNTER — Other Ambulatory Visit: Payer: Self-pay

## 2022-08-18 ENCOUNTER — Encounter: Payer: Self-pay | Admitting: Neurology

## 2022-08-18 ENCOUNTER — Inpatient Hospital Stay: Payer: Commercial Managed Care - HMO | Admitting: Neurology

## 2022-08-21 ENCOUNTER — Other Ambulatory Visit: Payer: Self-pay

## 2022-08-21 ENCOUNTER — Encounter (HOSPITAL_COMMUNITY): Payer: Self-pay

## 2022-08-21 ENCOUNTER — Emergency Department (HOSPITAL_COMMUNITY): Payer: Commercial Managed Care - HMO

## 2022-08-21 ENCOUNTER — Emergency Department (HOSPITAL_COMMUNITY)
Admission: EM | Admit: 2022-08-21 | Discharge: 2022-08-22 | Disposition: A | Discharge status: Home / Self Care | Payer: Commercial Managed Care - HMO | Attending: Emergency Medicine | Admitting: Emergency Medicine

## 2022-08-21 DIAGNOSIS — M436 Torticollis: Secondary | ICD-10-CM | POA: Diagnosis not present

## 2022-08-21 DIAGNOSIS — M542 Cervicalgia: Secondary | ICD-10-CM

## 2022-08-21 DIAGNOSIS — M7989 Other specified soft tissue disorders: Secondary | ICD-10-CM | POA: Diagnosis not present

## 2022-08-21 DIAGNOSIS — Z79899 Other long term (current) drug therapy: Secondary | ICD-10-CM | POA: Diagnosis not present

## 2022-08-21 DIAGNOSIS — N1831 Chronic kidney disease, stage 3a: Secondary | ICD-10-CM | POA: Insufficient documentation

## 2022-08-21 DIAGNOSIS — I129 Hypertensive chronic kidney disease with stage 1 through stage 4 chronic kidney disease, or unspecified chronic kidney disease: Secondary | ICD-10-CM | POA: Diagnosis not present

## 2022-08-21 DIAGNOSIS — R0602 Shortness of breath: Secondary | ICD-10-CM | POA: Insufficient documentation

## 2022-08-21 DIAGNOSIS — Z794 Long term (current) use of insulin: Secondary | ICD-10-CM | POA: Diagnosis not present

## 2022-08-21 DIAGNOSIS — R519 Headache, unspecified: Secondary | ICD-10-CM | POA: Diagnosis not present

## 2022-08-21 DIAGNOSIS — Z7982 Long term (current) use of aspirin: Secondary | ICD-10-CM | POA: Insufficient documentation

## 2022-08-21 DIAGNOSIS — E119 Type 2 diabetes mellitus without complications: Secondary | ICD-10-CM | POA: Diagnosis not present

## 2022-08-21 LAB — CBC WITH DIFFERENTIAL/PLATELET
Abs Immature Granulocytes: 0 10*3/uL (ref 0.00–0.07)
Basophils Absolute: 0.4 10*3/uL — ABNORMAL HIGH (ref 0.0–0.1)
Basophils Relative: 5 %
Eosinophils Absolute: 0.3 10*3/uL (ref 0.0–0.5)
Eosinophils Relative: 4 %
HCT: 34.5 % — ABNORMAL LOW (ref 36.0–46.0)
Hemoglobin: 11.1 g/dL — ABNORMAL LOW (ref 12.0–15.0)
Lymphocytes Relative: 37 %
Lymphs Abs: 2.7 10*3/uL (ref 0.7–4.0)
MCH: 25.5 pg — ABNORMAL LOW (ref 26.0–34.0)
MCHC: 32.2 g/dL (ref 30.0–36.0)
MCV: 79.1 fL — ABNORMAL LOW (ref 80.0–100.0)
Monocytes Absolute: 0.5 10*3/uL (ref 0.1–1.0)
Monocytes Relative: 7 %
Neutro Abs: 3.5 10*3/uL (ref 1.7–7.7)
Neutrophils Relative %: 47 %
Platelets: 305 10*3/uL (ref 150–400)
RBC: 4.36 MIL/uL (ref 3.87–5.11)
RDW: 16.8 % — ABNORMAL HIGH (ref 11.5–15.5)
WBC: 7.4 10*3/uL (ref 4.0–10.5)
nRBC: 0 % (ref 0.0–0.2)
nRBC: 0 /100 WBC

## 2022-08-21 LAB — URINALYSIS, ROUTINE W REFLEX MICROSCOPIC
Bacteria, UA: NONE SEEN
Bilirubin Urine: NEGATIVE
Glucose, UA: 50 mg/dL — AB
Hgb urine dipstick: NEGATIVE
Ketones, ur: NEGATIVE mg/dL
Leukocytes,Ua: NEGATIVE
Nitrite: NEGATIVE
Protein, ur: >=300 mg/dL — AB
Specific Gravity, Urine: 1.011 (ref 1.005–1.030)
pH: 7 (ref 5.0–8.0)

## 2022-08-21 LAB — COMPREHENSIVE METABOLIC PANEL
ALT: 36 U/L (ref 0–44)
AST: 32 U/L (ref 15–41)
Albumin: 3 g/dL — ABNORMAL LOW (ref 3.5–5.0)
Alkaline Phosphatase: 91 U/L (ref 38–126)
Anion gap: 9 (ref 5–15)
BUN: 9 mg/dL (ref 8–23)
CO2: 23 mmol/L (ref 22–32)
Calcium: 9.6 mg/dL (ref 8.9–10.3)
Chloride: 108 mmol/L (ref 98–111)
Creatinine, Ser: 0.73 mg/dL (ref 0.44–1.00)
GFR, Estimated: >60 mL/min (ref >60)
Glucose, Bld: 135 mg/dL — ABNORMAL HIGH (ref 70–99)
Potassium: 3.3 mmol/L — ABNORMAL LOW (ref 3.5–5.1)
Sodium: 140 mmol/L (ref 135–145)
Total Bilirubin: 0.6 mg/dL (ref 0.3–1.2)
Total Protein: 6.9 g/dL (ref 6.5–8.1)

## 2022-08-21 LAB — BRAIN NATRIURETIC PEPTIDE: B Natriuretic Peptide: 48.1 pg/mL (ref 0.0–100.0)

## 2022-08-21 LAB — CBG MONITORING, ED: Glucose-Capillary: 133 mg/dL — ABNORMAL HIGH (ref 70–99)

## 2022-08-21 MED ORDER — SODIUM CHLORIDE 0.9 % IV BOLUS
500.0000 mL | Freq: Once | INTRAVENOUS | Status: AC
  Administered 2022-08-21: 500 mL via INTRAVENOUS

## 2022-08-21 MED ORDER — ATORVASTATIN CALCIUM 40 MG PO TABS
80.0000 mg | ORAL_TABLET | Freq: Every day | ORAL | Status: DC
  Administered 2022-08-21 – 2022-08-22 (×2): 80 mg via ORAL
  Filled 2022-08-21 (×2): qty 2

## 2022-08-21 MED ORDER — INSULIN ASPART 100 UNIT/ML IJ SOLN: 0.0000 [IU] | Freq: Three times a day (TID) | INTRAMUSCULAR | Status: DC

## 2022-08-21 MED ORDER — HYDRALAZINE HCL 25 MG PO TABS
50.0000 mg | ORAL_TABLET | Freq: Three times a day (TID) | ORAL | Status: DC
  Administered 2022-08-21 – 2022-08-22 (×2): 50 mg via ORAL
  Filled 2022-08-21 (×2): qty 2

## 2022-08-21 MED ORDER — FLUOXETINE HCL 20 MG PO CAPS
20.0000 mg | ORAL_CAPSULE | Freq: Every day | ORAL | Status: DC
  Administered 2022-08-21 – 2022-08-22 (×2): 20 mg via ORAL
  Filled 2022-08-21 (×2): qty 1

## 2022-08-21 MED ORDER — TICAGRELOR 90 MG PO TABS
90.0000 mg | ORAL_TABLET | Freq: Two times a day (BID) | ORAL | Status: DC
  Administered 2022-08-21 – 2022-08-22 (×2): 90 mg via ORAL
  Filled 2022-08-21 (×2): qty 1

## 2022-08-21 MED ORDER — METHOCARBAMOL 500 MG PO TABS: 500.0000 mg | ORAL_TABLET | Freq: Two times a day (BID) | ORAL | 0 refills | Status: DC

## 2022-08-21 MED ORDER — ASPIRIN 81 MG PO TBEC
81.0000 mg | DELAYED_RELEASE_TABLET | Freq: Every day | ORAL | Status: DC
  Administered 2022-08-21 – 2022-08-22 (×2): 81 mg via ORAL
  Filled 2022-08-21 (×2): qty 1

## 2022-08-21 MED ORDER — DIPHENHYDRAMINE HCL 50 MG/ML IJ SOLN
12.5000 mg | Freq: Once | INTRAMUSCULAR | Status: AC
  Administered 2022-08-21: 12.5 mg via INTRAVENOUS
  Filled 2022-08-21: qty 1

## 2022-08-21 MED ORDER — HYDRALAZINE HCL 25 MG PO TABS: 50.0000 mg | ORAL_TABLET | Freq: Three times a day (TID) | ORAL | Status: DC

## 2022-08-21 MED ORDER — AMLODIPINE BESYLATE 5 MG PO TABS
10.0000 mg | ORAL_TABLET | Freq: Every day | ORAL | Status: DC
  Administered 2022-08-21 – 2022-08-22 (×2): 10 mg via ORAL
  Filled 2022-08-21 (×2): qty 2

## 2022-08-21 MED ORDER — HYDROMORPHONE HCL 1 MG/ML IJ SOLN
0.5000 mg | Freq: Once | INTRAMUSCULAR | Status: AC
  Administered 2022-08-21: 0.5 mg via INTRAVENOUS
  Filled 2022-08-21: qty 1

## 2022-08-21 MED ORDER — EZETIMIBE 10 MG PO TABS
10.0000 mg | ORAL_TABLET | Freq: Every day | ORAL | Status: DC
  Administered 2022-08-21 – 2022-08-22 (×2): 10 mg via ORAL
  Filled 2022-08-21: qty 1

## 2022-08-21 MED ORDER — IOHEXOL 350 MG/ML SOLN
75.0000 mL | Freq: Once | INTRAVENOUS | Status: AC | PRN
  Administered 2022-08-21: 75 mL via INTRAVENOUS

## 2022-08-21 MED ORDER — PANTOPRAZOLE SODIUM 20 MG PO TBEC
40.0000 mg | DELAYED_RELEASE_TABLET | Freq: Every day | ORAL | Status: DC
  Administered 2022-08-21 – 2022-08-22 (×2): 40 mg via ORAL
  Filled 2022-08-21 (×2): qty 2

## 2022-08-21 MED ORDER — SODIUM CHLORIDE 0.9 % IV SOLN
25.0000 mg | Freq: Once | INTRAVENOUS | Status: AC
  Administered 2022-08-21: 25 mg via INTRAVENOUS
  Filled 2022-08-21: qty 1

## 2022-08-21 MED ORDER — INSULIN GLARGINE-YFGN 100 UNIT/ML ~~LOC~~ SOLN
5.0000 [IU] | Freq: Every day | SUBCUTANEOUS | Status: DC
  Administered 2022-08-21 – 2022-08-22 (×2): 5 [IU] via SUBCUTANEOUS
  Filled 2022-08-21 (×3): qty 0.05

## 2022-08-21 NOTE — ED Provider Notes (Signed)
[place] EMERGENCY DEPARTMENT AT [facility] Provider Note   CSN: [id number] Arrival date & time: 08/21/22  0865     History  Chief Complaint  Patient presents with   Neck Pain   Hand Pain   Shortness of Breath    [name] is a 62 y.o. female.  Patient presents to the emergency department complaining of worsening neck pain and headache.  Patient with past medical history significant for multiple strokes in 2022, 2023, and 2024, hypertension, hyperlipidemia, diabetes mellitus, PVD, non-Hodgkin's lymphoma, CKD stage IIIa.  Patient was released from rehab after most recent stroke, returned home on Wednesday of this week.  Patient's sister is in room with her.  States that they have no home health yet at this time.  They have been contacted but has not been established yet.  Patient has reportedly had neck pain since the onset of the most recent stroke.  She states that has worsened over the past few days.  She is here today primarily for the worsening neck pain and headache but also complains of some bilateral hand swelling and discomfort and mild shortness of breath.  She denies chest pain, nausea, vomiting.  Patient has residual left-sided weakness and slurred speech  HPI     Home Medications Prior to Admission medications   Medication Sig Start Date End Date Taking? Authorizing Provider  methocarbamol (ROBAXIN) 500 MG tablet Take 1 tablet (500 mg total) by mouth 2 (two) times daily. 08/21/22  Yes Darrick Grinder, PA-C  acetaminophen (TYLENOL) 325 MG tablet Take 650 mg by mouth every 4 (four) hours as needed for mild pain. Do not exceed 3000mg  in 24 hours    [provider]  AMBULATORY NON FORMULARY MEDICATION Medication Name: Carafate 50 mg suppositories: Insert 1 suppository rectally twice a day for 2 weeks 07/11/22   Armbruster, Willaim Rayas, MD  amLODipine (NORVASC) 10 MG tablet Take 1 tablet by mouth once daily 05/09/22   [place], [name], MD  aspirin EC 81 MG  tablet Take 1 tablet (81 mg total) by mouth daily. Swallow whole. 06/18/22   Osvaldo Shipper, MD  atorvastatin (LIPITOR) 80 MG tablet Take 1 tablet (80 mg total) by mouth daily. 05/09/22   [place], [name], MD  B Complex Vitamins (VITAMIN B COMPLEX) TABS Take 1 tablet by mouth every morning.    [provider]  Blood Pressure Monitoring (BLOOD PRESSURE MONITOR AUTOMAT) DEVI 1 Device by Does not apply route daily. 08/29/18   [place], [name], MD  Cholecalciferol (VITAMIN D3 PO) Take 1 capsule by mouth every morning.    [provider]  Dulaglutide (TRULICITY) 0.75 MG/0.5ML SOPN Inject 0.75 mg into the skin once a week. Patient taking differently: Inject 0.75 mg into the skin once a week. Friday 04/13/22   Shamleffer, Konrad Dolores, MD  ezetimibe (ZETIA) 10 MG tablet Take 1 tablet (10 mg total) by mouth daily. 05/09/22   [place], [name], MD  FLUoxetine (PROZAC) 20 MG capsule Take 1 capsule by mouth once daily 11/16/21   [place], [name], MD  glucose blood (CONTOUR NEXT TEST) test strip 1 each by Other route 2 (two) times daily. And lancets 2/day 03/09/18   Romero Belling, MD  glucose blood [facility] VERIO) test strip USE TO CHECK BLOOD SUGAR TWICE A DAY AND PRN 07/29/15   Gordy Savers, MD  hydrALAZINE (APRESOLINE) 50 MG tablet TAKE 1 TABLET BY MOUTH THREE TIMES DAILY 11/16/21   [place], [name], MD  insulin aspart (NOVOLOG) 100 UNIT/ML injection Inject 0-9 Units into the skin 3 (three) times daily with meals. 0-9 Units, Subcutaneous, 3 times daily with meals, First dose on Thu 07/07/22 at 0800 Correction coverage: Sensitive (thin, NPO, renal) CBG < 70: Implement Hypoglycemia Standing Orders and refer to Hypoglycemia Standing Orders sidebar report CBG 70 - 120: 0 units CBG 121 - 150: 1 unit CBG 151 - 200: 2 units CBG 201 - 250: 3 units CBG 251 - 300: 5 units CBG 301 - 350: 7 units CBG 351 - 400: 9 units CBG > 400: call MD. 07/11/22   Elease Etienne, MD  insulin glargine-yfgn (SEMGLEE) 100  UNIT/ML injection Inject 0.05 mLs (5 Units total) into the skin daily. 07/12/22   Hongalgi, Maximino Greenland, MD  loperamide (IMODIUM) 2 MG capsule Take 2 capsules (4 mg total) by mouth 3 (three) times daily as needed for diarrhea or loose stools. 06/17/22   Osvaldo Shipper, MD  omeprazole (PRILOSEC OTC) 20 MG tablet Take 20 mg by mouth daily.    [provider]  ONE TOUCH LANCETS MISC USE TO CHECK BLOOD SUGAR TWICE A DAY AND PRN 07/29/15   Gordy Savers, MD  saccharomyces boulardii (FLORASTOR) 250 MG capsule Take 1 capsule (250 mg total) by mouth 2 (two) times daily. 06/17/22   Osvaldo Shipper, MD  ticagrelor (BRILINTA) 90 MG TABS tablet Take 1 tablet (90 mg total) by mouth 2 (two) times daily. 06/17/22   Osvaldo Shipper, MD      Allergies    Patient has no known allergies.    Review of Systems   Review of Systems  Physical Exam Updated Vital Signs BP (!) 175/103   Pulse 82   Temp 98.8 F (37.1 C) (Oral)   Resp 20   Ht 5\' 4"  (1.626 m)   Wt 71.7 kg   SpO2 100%   BMI 27.12 kg/m  Physical Exam Vitals and nursing note reviewed.  Constitutional:      Appearance: She is well-developed.  HENT:     Head: Normocephalic and atraumatic.  Eyes:     Conjunctiva/sclera: Conjunctivae normal.  Neck:     Comments: Neck is supple, right-sided paraspinal muscles tender to palpation, no midline spinal tenderness noted in the cervical region Cardiovascular:     Rate and Rhythm: Normal rate and regular rhythm.  Pulmonary:     Effort: Pulmonary effort is normal. No respiratory distress.     Breath sounds: Normal breath sounds.  Abdominal:     Palpations: Abdomen is soft.     Tenderness: There is no abdominal tenderness.  Musculoskeletal:        General: No swelling.     Cervical back: Neck supple.     Right lower leg: No edema.     Left lower leg: No edema.     Comments: Mild swelling noted to bilateral hands with no tenderness to palpation. Left hand weak at baseline. Normal cap refill  in bilateral fingers  Skin:    General: Skin is warm and dry.     Capillary Refill: Capillary refill takes less than 2 seconds.  Neurological:     Mental Status: She is alert.     Comments: Left-sided weakness and slurred speech at baseline.  No new focal neurologic deficit noted  Psychiatric:        Mood and Affect: Mood normal.     ED Results / Procedures / Treatments   Labs (all labs ordered are listed, but only abnormal  results are displayed) Labs Reviewed  COMPREHENSIVE METABOLIC PANEL - Abnormal; Notable for the following components:      Result Value   Potassium 3.3 (*)    Glucose, Bld 135 (*)    Albumin 3.0 (*)    All other components within normal limits  CBC WITH DIFFERENTIAL/PLATELET - Abnormal; Notable for the following components:   Hemoglobin 11.1 (*)    HCT 34.5 (*)    MCV 79.1 (*)    MCH 25.5 (*)    RDW 16.8 (*)    Basophils Absolute 0.4 (*)    All other components within normal limits  URINALYSIS, ROUTINE W REFLEX MICROSCOPIC - Abnormal; Notable for the following components:   APPearance HAZY (*)    Glucose, UA 50 (*)    Protein, ur >=300 (*)    All other components within normal limits  BRAIN NATRIURETIC PEPTIDE    EKG EKG Interpretation  Date/Time:  Sunday August 21 2022 06:21:06 EDT Ventricular Rate:  83 PR Interval:  127 QRS Duration: 78 QT Interval:  380 QTC Calculation: 447 R Axis:   39 Text Interpretation: Sinus rhythm Borderline T abnormalities, inferior leads Baseline wander in lead(s) V3 similar to prior Confirmed by Tanda Rockers ([id number]) on 08/21/2022 7:43:05 AM  Radiology CT Soft Tissue Neck W Contrast  Result Date: 08/21/2022 CLINICAL DATA:  62 year old female with increasing headache. Neck pain, bilateral extremity swelling. History of stroke, recently discharged from rehabilitation. By report in 2021 the patient was diagnosed with leukemia/lymphoma. EXAM: CT NECK WITH CONTRAST TECHNIQUE: Multidetector CT imaging of the neck was  performed using the standard protocol following the bolus administration of intravenous contrast. RADIATION DOSE REDUCTION: This exam was performed according to the departmental dose-optimization program which includes automated exposure control, adjustment of the mA and/or kV according to patient size and/or use of iterative reconstruction technique. CONTRAST:  75mL OMNIPAQUE IOHEXOL 350 MG/ML SOLN COMPARISON:  Head CT today reported separately. CTA head and neck 09/15/2021. previous neck CT 10/15/2019. FINDINGS: Pharynx and larynx: Hypopharynx motion artifact. The glottis is closed today. No laryngeal or pharyngeal mass is identified. Parapharyngeal spaces are within normal limits. Stable retropharyngeal space with partially retropharyngeal course of the right carotid. Salivary glands: Sublingual space, submandibular glands and parotid glands are stable from last year, within normal limits. Thyroid: Stable. Chronically diminutive or absent left thyroid lobe. Right thyroid and isthmus heterogeneity appears stable since 2021 In the setting of significant comorbidity (history of cancer) no follow-up recommended (ref: J Am Coll Radiol. 2015 Feb;12(2): 143-50). Lymph nodes: Generalized cervical lymphadenopathy, very similar to the 2021 neck CT. The largest individual lymph nodes now are 12-16 mm short axis. Only the bilateral level 5 nodal stations are relatively spared. No cystic or necrotic lymph nodes. Vascular: Partially occluded right vertebral artery again noted. Chronic cervical carotid atherosclerosis, partially retropharyngeal course. Bilateral internal jugular veins remain patent. Limited intracranial: Stable, negative. Visualized orbits: Stable, negative. Mastoids and visualized paranasal sinuses: Visualized paranasal sinuses and mastoids are stable and well aerated. Skeleton: No acute or suspicious osseous lesion identified. Cervical spine degeneration and marrow heterogeneity appear not significantly  changed since the 2021 CT. Upper chest: Aberrant origin right subclavian artery, normal variant. Bulky partially visible bilateral axillary lymphadenopathy. But no visible superior mediastinal lymphadenopathy. Lung apices are clear with mild respiratory motion. Mild proximal great vessel atherosclerosis. IMPRESSION: 1. Generalized cervical and partially visible bilateral bulky axillary lymphadenopathy consistent with active leukemia/lymphoma (reportedly diagnosed in 2021). Cervical lymphadenopathy now very similar to a 2021 Neck  CT. 2. No other acute or inflammatory process identified in the Neck. Electronically Signed   By: Odessa Fleming M.D.   On: 08/21/2022 11:50   CT Head Wo Contrast  Result Date: 08/21/2022 CLINICAL DATA:  Headache, increasing frequency or severity. EXAM: CT HEAD WITHOUT CONTRAST TECHNIQUE: Contiguous axial images were obtained from the base of the skull through the vertex without intravenous contrast. RADIATION DOSE REDUCTION: This exam was performed according to the departmental dose-optimization program which includes automated exposure control, adjustment of the mA and/or kV according to patient size and/or use of iterative reconstruction technique. COMPARISON:  Head CT 06/11/2022, MR brain 06/12/2022 FINDINGS: Brain: There is a wedge-shaped defect in the ventral medulla in the location of the previous evolving infarct. No asymmetry is seen concerning for an acute cortical based infarct, hemorrhage or mass effect. The ventricles are normal in size and position. There are scattered calcifications along the falx. Slight cortical atrophy both cerebral hemispheres. Mild small-vessel changes in the cerebral white matter also are again noted. Vascular: Calcified plaque both siphons, both distal vertebral arteries. No hyperdense central vessels. Skull: Negative for fractures or focal lesions. Sinuses/Orbits: Mild chronic membrane thickening both maxillary sinuses. Other visualized sinuses are clear.  There is scattered fluid in the lower left mastoid air cells, unchanged with clear right mastoids. Other: None. IMPRESSION: 1. No acute intracranial CT findings. 2. Wedge-shaped defect in the ventral medulla in the location of the previous evolving infarct. 3. Chronic left mastoid effusion. Electronically Signed   By: Almira Bar M.D.   On: 08/21/2022 08:00   DG Chest Portable 1 View  Result Date: 08/21/2022 CLINICAL DATA:  Shortness of breath. EXAM: PORTABLE CHEST 1 VIEW COMPARISON:  Portable chest 07/09/2022 FINDINGS: There is a low inspiration on exam. The lungs clear of acute infiltrates and no pleural collection is seen. Linear scarring is again noted left mid field. There is mild cardiomegaly without evidence of CHF. Calcific plaque noted aortic arch with normal mediastinal configuration. Mild thoracic spondylosis.  Multiple overlying monitor wires. IMPRESSION: No acute cardiopulmonary findings. Mild cardiomegaly. Aortic atherosclerosis. Electronically Signed   By: Almira Bar M.D.   On: 08/21/2022 07:53    Procedures Procedures    Medications Ordered in ED Medications  promethazine (PHENERGAN) 25 mg in sodium chloride 0.9 % 50 mL IVPB (0 mg Intravenous Stopped 08/21/22 0857)  diphenhydrAMINE (BENADRYL) injection 12.5 mg (12.5 mg Intravenous Given 08/21/22 0855)  sodium chloride 0.9 % bolus 500 mL (500 mLs Intravenous New Bag/Given 08/21/22 0827)  HYDROmorphone (DILAUDID) injection 0.5 mg (0.5 mg Intravenous Given 08/21/22 1037)  sodium chloride 0.9 % bolus 500 mL (500 mLs Intravenous New Bag/Given 08/21/22 1041)  iohexol (OMNIPAQUE) 350 MG/ML injection 75 mL (75 mLs Intravenous Contrast Given 08/21/22 1136)    ED Course/ Medical Decision Making/ A&P Clinical Course as of 08/21/22 1218  Sun Aug 21, 2022  1045 Pt with neck pain that has been ongoing >6 mos per pt / family, appears c/w torticollis. She reports diff with po typically but feels perhaps worse today. Pain to b/l SCM,  reproducible on palpation. Pt feels her swallowing is worsening, no dib. Will get ct neck and give further analgesia,  [SG]    Clinical Course User Index [SG] Sloan Leiter, DO                             Medical Decision Making Amount and/or Complexity of Data Reviewed Labs: ordered.  Radiology: ordered.  Risk Prescription drug management.   This patient presents to the ED for concern of neck pain, headache, shortness of breath, this involves an extensive number of treatment options, and is a complaint that carries with it a high risk of complications and morbidity.  The differential diagnosis includes intracranial abnormality, muscle tension, fracture, dislocation, pneumonia, heart failure, others   Co morbidities that complicate the patient evaluation  History of multiple strokes and chronic right-sided neck pain   Additional history obtained:  Additional history obtained from patient's sister and EMS External records from outside source obtained and reviewed including discharge summary from February 19   Lab Tests:  I Ordered, and personally interpreted labs.  The pertinent results include: Stable CBC, BMP 48.1, potassium 3.3   Imaging Studies ordered:  I ordered imaging studies including CT head without contrast, chest x-ray I independently visualized and interpreted imaging which showed  1. No acute intracranial CT findings.  2. Wedge-shaped defect in the ventral medulla in the location of the  previous evolving infarct.  3. Chronic left mastoid effusion   No acute cardiopulmonary findings. Mild cardiomegaly. Aortic  atherosclerosis.   1. Generalized cervical and partially visible bilateral bulky  axillary lymphadenopathy consistent with active leukemia/lymphoma  (reportedly diagnosed in 2021). Cervical lymphadenopathy now very  similar to a 2021 Neck CT.  2. No other acute or inflammatory process identified in the Neck.   I agree with the radiologist  interpretation   Cardiac Monitoring: / EKG:  The patient was maintained on a cardiac monitor.  I personally viewed and interpreted the cardiac monitored which showed an underlying rhythm of: Sinus rhythm   Problem List / ED Course / Critical interventions / Medication management   I ordered medication including Benadryl, Phenergan, Dilaudid, and normal saline for neck pain  Reevaluation of the patient after these medicines showed that the patient improved I have reviewed the patients home medicines and have made adjustments as needed   Social Determinants of Health:  Patient with left-sided weakness due to previous stroke, lives at home with sister.  Home health in process of being arranged   Test / Admission - Considered:  I see no indication at this time for admission.  Patient with CT scan soft tissue of the neck showing lymphadenopathy consistent with 2021 scan.  No new acute findings.  CT head with no acute findings.  Chest x-ray with no acute findings.  Lab work grossly unremarkable.  Patient seems to have multiple chronic problems but no acute emergent conditions at this time.  Neck pain seems consistent with torticollis.  Patient maintaining oxygen saturations on room air. No pneumonia on chest x-ray, normal BNP. No chest pain. Plan to discharge home with muscle relaxants with recommendations for follow-up with primary care as needed for neck pain.  Patient is supposed to have follow-up with GI due to recent colonoscopy results.  No indication for further workup at this time in the emergent setting.  Discharge home at this time.        Final Clinical Impression(s) / ED Diagnoses Final diagnoses:  Neck pain  Nonintractable headache, unspecified chronicity pattern, unspecified headache type  Torticollis    Rx / DC Orders ED Discharge Orders          Ordered    methocarbamol (ROBAXIN) 500 MG tablet  2 times daily        08/21/22 1209              Rudi Bunyard,  Dorisann Frames, PA-C 08/21/22 1218    Sloan Leiter, DO 08/21/22 [phone]

## 2022-08-21 NOTE — Progress Notes (Signed)
CSW spoke with patient's sister [name] via phone to discuss discharge plan. [name] states patient is unable to ambulate or take care of herself independently. [name] requesting return to SNF for short term rehab.  CSW spoke with PA to inform him that SNF workup will be initiated but that patient will need to remain in the ED as a boarder until a facility is chosen.  CSW will complete FL2 and fax patient's clinical information out for review.  Edwin Dada, MSW, LCSW Transitions of Care  Clinical Social Worker II [phone]

## 2022-08-21 NOTE — ED Notes (Signed)
SW dispo. Plan is to transfer pt to a SNF. Waiting for SW at this time.

## 2022-08-21 NOTE — Discharge Instructions (Addendum)
You were evaluated today for neck pain. Your CT scans were reassuring. You have continued enlarged lymph nodes but no new findings. I have prescribed a muscle relaxer. Please take as directed. Follow up with primary care

## 2022-08-21 NOTE — ED Notes (Signed)
PT/OT at bedside.

## 2022-08-21 NOTE — ED Triage Notes (Signed)
Pt states she has has had neck pain since her stroke and has been seen for it but she states it is worse   Pt also c/o bilat hand swelling and tenderness and SOB   Pt had recent CVA and just was discharged from Rehab and at home   Pt is at baseline after CVA - slurred speech, left and sided weakness ,

## 2022-08-21 NOTE — [place] FL2 (Signed)
[place] MEDICAID FL2 LEVEL OF CARE FORM     IDENTIFICATION  Patient Name: [name] Birthdate: 23-Jun-1960 Sex: female Admission Date (Current Location): 08/21/2022  [facility] and [place] Number:  Producer, television/film/video and Address:  [facility] [place], [address] [place], [place] [address]      Provider Number: [id number]  Attending Physician Name and Address:  Sloan Leiter, DO  Relative Name and Phone Number:       Current Level of Care: Hospital Recommended Level of Care: Skilled Nursing Facility Prior Approval Number:    Date Approved/Denied:   PASRR Number: [id number]  Discharge Plan: SNF    Current Diagnoses: Patient Active Problem List   Diagnosis Date Noted   Rectal ulcer 07/09/2022   Colon ulcer 07/08/2022   Rectal bleeding 07/07/2022   Anemia 07/07/2022   GI bleed 07/06/2022   Dysarthria 06/12/2022   Aspiration pneumonia 06/12/2022   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   Anxiety disorder due to medical condition 06/10/2022   AKI (acute kidney injury) 06/01/2022   Dizziness 05/29/2022   Personal history of CLL (chronic lymphocytic leukemia) 05/29/2022   Abdominal pain 05/29/2022   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 disease, with long-term current use of insulin 04/14/2022   Type 2 diabetes mellitus with diabetic polyneuropathy, with long-term current use of insulin 04/14/2022   Antiplatelet or antithrombotic long-term use 03/16/2022   Hyperlipidemia LDL goal <70 10/01/2021   Acute stroke of medulla oblongata 09/17/2021   Stage 3a chronic kidney disease (CKD) 09/16/2021   Thyroid nodule 09/16/2021   Cervical lymphadenopathy 09/16/2021   Acute CVA (cerebrovascular accident) 09/15/2021   GERD (gastroesophageal reflux disease) 04/30/2021   Peripheral arterial disease 04/13/2021   Atherosclerosis of aorta 02/14/2021   Cerebral thrombosis with cerebral infarction 02/08/2021   Abnormal MRI of head 02/07/2021    Vertigo 02/07/2021   Class 1 obesity due to excess calories with body mass index (BMI) of 31.0 to 31.9 in adult 02/07/2021   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   Trigger finger, left ring finger 10/04/2018   Alternating constipation and diarrhea 04/20/2018   Diabetic peripheral neuropathy associated with type 2 diabetes mellitus 02/26/2018   DM (diabetes mellitus), type 2 with peripheral vascular complications 07/29/2015   ABNORMALITY OF GAIT 05/10/2010   ECZEMA, ATOPIC 04/03/2009   Hyperlipidemia associated with type 2 diabetes mellitus 03/06/2009   BACK PAIN 11/14/2008   Cerebrovascular disease 08/05/2008   DIPLOPIA 07/15/2008   Essential hypertension 07/15/2008    Orientation RESPIRATION BLADDER Height & Weight     Self, Time, Situation, Place  Normal Continent, External catheter Weight: 158 lb (71.7 kg) Height:   (162.6 cm)  BEHAVIORAL SYMPTOMS/MOOD NEUROLOGICAL BOWEL NUTRITION STATUS      Continent Diet  AMBULATORY STATUS COMMUNICATION OF NEEDS Skin   Extensive Assist Verbally Normal                       Personal Care Assistance Level of Assistance  Bathing, Dressing, Feeding Bathing Assistance: Maximum assistance Feeding assistance: Limited assistance Dressing Assistance: Maximum assistance     Functional Limitations Info  Sight, Hearing, Speech Sight Info: Impaired Hearing Info: Adequate Speech Info: Adequate    SPECIAL CARE FACTORS FREQUENCY  PT (By licensed PT), OT (By licensed OT)     PT Frequency: 5x weekly OT Frequency: 5x weekly            Contractures Contractures Info: Not present  Additional Factors Info  Code Status, Allergies Code Status Info: Full Code Allergies Info: No known allergies           Current Medications (08/21/2022):  This is the current hospital active medication list No current facility-administered medications for this encounter.   Current Outpatient Medications  Medication Sig Dispense Refill    acetaminophen (TYLENOL) 325 MG tablet Take 650 mg by mouth every 4 (four) hours as needed for mild pain. Do not exceed  in 24 hours     AMBULATORY NON FORMULARY MEDICATION Medication Name: Carafate 50 mg suppositories: Insert 1 suppository rectally twice a day for 2 weeks 28 suppository 0   amLODipine (NORVASC) 10 MG tablet Take 1 tablet by mouth once daily 90 tablet 1   aspirin EC 81 MG tablet Take 1 tablet (81 mg total) by mouth daily. Swallow whole. 30 tablet 12   atorvastatin (LIPITOR) 80 MG tablet Take 1 tablet (80 mg total) by mouth daily. 30 tablet 3   B Complex Vitamins (VITAMIN B COMPLEX) TABS Take 1 tablet by mouth every morning.     Blood Pressure Monitoring (BLOOD PRESSURE MONITOR AUTOMAT) DEVI 1 Device by Does not apply route daily. 1 Device 0   Cholecalciferol (VITAMIN D3 PO) Take 1 capsule by mouth every morning.     Dulaglutide (TRULICITY) 0.75 MG/0.5ML SOPN Inject 0.75 mg into the skin once a week. (Patient taking differently: Inject 0.75 mg into the skin once a week. Friday) 6 mL 3   ezetimibe (ZETIA) 10 MG tablet Take 1 tablet (10 mg total) by mouth daily. 30 tablet 3   FLUoxetine (PROZAC) 20 MG capsule Take 1 capsule by mouth once daily 90 capsule 2   glucose blood (CONTOUR NEXT TEST) test strip 1 each by Other route 2 (two) times daily. And lancets 2/day 200 each 3   glucose blood (ONETOUCH VERIO) test strip USE TO CHECK BLOOD SUGAR TWICE A DAY AND PRN 100 each 6   hydrALAZINE (APRESOLINE) 50 MG tablet TAKE 1 TABLET BY MOUTH THREE TIMES DAILY 90 tablet 2   insulin aspart (NOVOLOG) 100 UNIT/ML injection Inject 0-9 Units into the skin 3 (three) times daily with meals. 0-9 Units, Subcutaneous, 3 times daily with meals, First dose on Thu 07/07/22 at 0800 Correction coverage: Sensitive (thin, NPO, renal) CBG < 70: Implement Hypoglycemia Standing Orders and refer to Hypoglycemia Standing Orders sidebar report CBG 70 - 120: 0 units CBG 121 - 150: 1 unit CBG 151 - 200: 2 units CBG 201  - 250: 3 units CBG 251 - 300: 5 units CBG 301 - 350: 7 units CBG 351 - 400: 9 units CBG > 400: call MD.     insulin glargine-yfgn (SEMGLEE) 100 UNIT/ML injection Inject 0.05 mLs (5 Units total) into the skin daily.     loperamide (IMODIUM) 2 MG capsule Take 2 capsules (4 mg total) by mouth 3 (three) times daily as needed for diarrhea or loose stools. 30 capsule 0   methocarbamol (ROBAXIN) 500 MG tablet Take 1 tablet (500 mg total) by mouth 2 (two) times daily. 20 tablet 0   omeprazole (PRILOSEC OTC) 20 MG tablet Take 20 mg by mouth daily.     ONE TOUCH LANCETS MISC USE TO CHECK BLOOD SUGAR TWICE A DAY AND PRN 100 each 6   saccharomyces boulardii (FLORASTOR) 250 MG capsule Take 1 capsule (250 mg total) by mouth 2 (two) times daily.     ticagrelor (BRILINTA) 90 MG TABS tablet Take 1 tablet (90  mg total) by mouth 2 (two) times daily. 60 tablet      Discharge Medications: Please see discharge summary for a list of discharge medications.  Relevant Imaging Results:  Relevant Lab Results:   Additional Information SSN: [id number]  Inis Sizer, LCSW

## 2022-08-21 NOTE — Evaluation (Signed)
Physical Therapy Evaluation Patient Details Name: [name] MRN: [id number] DOB: 10-28-1960 Today's Date: 08/21/2022  History of Present Illness  Pt is a 62 y.o. F who presents 08/21/2022 with worsening neck pain and HA and difficulty caring for self at home. PMH: multiple strokes in 2022, 2023 and 2024, HTN, HLD, IDDM, PVD, non-Hodgkin's lymphoma, sickle cell trait, CKD stage IIIa  Clinical Impression  Pt with recent d/c from [facility]. Pt requiring hoyer lift for out of bed transfers and assist with ADL's from family members. Pt sister reports back issues and has difficulty providing level of assist that pt requires. Pt continues with left hemiparesis, impaired balance, expressive difficulties. Requiring moderate assist to roll to R/L. Will need two person assist for sitting edge of bed or transfer attempt. Pt was using "sit to stand" lift with therapy staff at SNF. Due to deficits and decreased caregiver support, recommend return to SNF to address deficits, maximize functional mobility and decrease caregiver burden. If insurance is a barrier to d/c location, recommend max HH services including HHPT/OT/HH aide. Pt family has needed DME.      Recommendations for follow up therapy are one component of a multi-disciplinary discharge planning process, led by the attending physician.  Recommendations may be updated based on patient status, additional functional criteria and insurance authorization.  Follow Up Recommendations Can patient physically be transported by private vehicle: No     Assistance Recommended at Discharge Frequent or constant Supervision/Assistance  Patient can return home with the following  Two people to help with walking and/or transfers;Two people to help with bathing/dressing/bathroom    Equipment Recommendations None recommended by PT  Recommendations for Other Services       Functional Status Assessment Patient has had a recent decline in their functional  status and demonstrates the ability to make significant improvements in function in a reasonable and predictable amount of time.     Precautions / Restrictions Precautions Precautions: Fall;Other (comment) Precaution Comments: L hemiparesis Restrictions Weight Bearing Restrictions: No      Mobility  Bed Mobility Overal bed mobility: Needs Assistance Bed Mobility: Rolling Rolling: Mod assist         General bed mobility comments: Rolling to R/L with modA    Transfers                   General transfer comment: deferred due to stretcher and lack of +2 assist    Ambulation/Gait                  Stairs            Wheelchair Mobility    Modified Rankin (Stroke Patients Only)       Balance                                             Pertinent Vitals/Pain Pain Assessment Pain Assessment: Faces Faces Pain Scale: Hurts little more Pain Location: neck Pain Descriptors / Indicators: Discomfort, Guarding Pain Intervention(s): Limited activity within patient's tolerance, Monitored during session, Repositioned    Home Living Family/patient expects to be discharged to:: Skilled nursing facility                   Additional Comments: Pt has hoyer lift, wheelchair, hospital bed    Prior Function Prior Level of Function : Needs assist  Mobility Comments: hoyer lift OOB or sit to stand lift with therapy ADLs Comments: assist for ADL's     Hand Dominance   Dominant Hand: Right    Extremity/Trunk Assessment   Upper Extremity Assessment Upper Extremity Assessment: Defer to OT evaluation    Lower Extremity Assessment Lower Extremity Assessment: LLE deficits/detail LLE Deficits / Details: No active movement noted, ROM WFL       Communication   Communication: Expressive difficulties  Cognition Arousal/Alertness: Awake/alert Behavior During Therapy: WFL for tasks assessed/performed Overall  Cognitive Status: Difficult to assess                                 General Comments: Follows all commands        General Comments      Exercises     Assessment/Plan    PT Assessment Patient needs continued PT services  PT Problem List Decreased strength;Decreased balance;Decreased activity tolerance;Decreased mobility;Pain       PT Treatment Interventions Functional mobility training;Therapeutic activities;Therapeutic exercise;Balance training;Neuromuscular re-education;Cognitive remediation;Patient/family education    PT Goals (Current goals can be found in the Care Plan section)  Acute Rehab PT Goals Patient Stated Goal: pt sister would like her to go back to rehab PT Goal Formulation: With patient/family Time For Goal Achievement: 09/04/22 Potential to Achieve Goals: Fair    Frequency Min 2X/week     Co-evaluation               AM-PAC PT "6 Clicks" Mobility  Outcome Measure Help needed turning from your back to your side while in a flat bed without using bedrails?: A Lot Help needed moving from lying on your back to sitting on the side of a flat bed without using bedrails?: Total Help needed moving to and from a bed to a chair (including a wheelchair)?: Total Help needed standing up from a chair using your arms (e.g., wheelchair or bedside chair)?: Total Help needed to walk in hospital room?: Total Help needed climbing 3-5 steps with a railing? : Total 6 Click Score: 7    End of Session   Activity Tolerance: Patient tolerated treatment well Patient left: in bed;with call bell/phone within reach;with family/visitor present Nurse Communication: Mobility status PT Visit Diagnosis: Other abnormalities of gait and mobility (R26.89);Pain Pain - part of body:  (neck)    Time: 8119-1478 PT Time Calculation (min) (ACUTE ONLY): 20 min   Charges:   PT Evaluation $PT Eval Moderate Complexity: 1 Mod          Lillia Pauls, PT, DPT Acute  Rehabilitation Services Office [phone]   Norval Morton 08/21/2022, 4:01 PM

## 2022-08-22 LAB — CBG MONITORING, ED
Glucose-Capillary: 152 mg/dL — ABNORMAL HIGH (ref 70–99)
Glucose-Capillary: 138 mg/dL — ABNORMAL HIGH (ref 70–99)
Glucose-Capillary: 129 mg/dL — ABNORMAL HIGH (ref 70–99)

## 2022-08-22 MED ORDER — POTASSIUM CHLORIDE CRYS ER 20 MEQ PO TBCR
40.0000 meq | EXTENDED_RELEASE_TABLET | Freq: Once | ORAL | Status: AC
  Administered 2022-08-22: 40 meq via ORAL
  Filled 2022-08-22: qty 2

## 2022-08-22 MED ORDER — TRAMADOL HCL 50 MG PO TABS: 50.0000 mg | ORAL_TABLET | Freq: Four times a day (QID) | ORAL | Status: DC | PRN

## 2022-08-22 MED ORDER — INSULIN ASPART 100 UNIT/ML IJ SOLN: 0.0000 [IU] | Freq: Three times a day (TID) | INTRAMUSCULAR | Status: DC

## 2022-08-22 MED ORDER — OXYCODONE-ACETAMINOPHEN 5-325 MG PO TABS
1.0000 | ORAL_TABLET | Freq: Once | ORAL | Status: AC
  Administered 2022-08-22: 1 via ORAL
  Filled 2022-08-22: qty 1

## 2022-08-22 MED ORDER — ACETAMINOPHEN 500 MG PO TABS
1000.0000 mg | ORAL_TABLET | Freq: Once | ORAL | Status: AC
  Administered 2022-08-22: 1000 mg via ORAL
  Filled 2022-08-22: qty 2

## 2022-08-22 NOTE — ED Provider Notes (Signed)
4:30 PM nursing staff came and got me that the patient was complaining of rectal pain. Chart reviewed, patient has been boarding here for placement for approximately 36 hours. Family has opted to take her home instead and she is pending discharge at this time.   I performed rectal exam per family's recommendation which revealed soft light brown stool in the rectum with no wounds, fissures, or hemorrhoids. No tenderness to palpation. She does have superficial skin breakdown to the sacral area and mepilex foam dressing applied to the area. Patient given percocet for pain per families request. Discharge paperwork submitted by previous provider. No indication for additional work-up at this time.   Vear Clock 08/22/22 1659    Lorre Nick, MD 08/23/22 2251

## 2022-08-22 NOTE — ED Notes (Signed)
Pts bed and linens changed 

## 2022-08-22 NOTE — Evaluation (Signed)
Clinical/Bedside Swallow Evaluation Patient Details  Name: [name] MRN: [id number] Date of Birth: 01-03-1961  Today's Date: 08/22/2022 Time: SLP Start Time (ACUTE ONLY): 1431 SLP Stop Time (ACUTE ONLY): 1443 SLP Time Calculation (min) (ACUTE ONLY): 12 min  Past Medical History:  Past Medical History:  Diagnosis Date   Abnormality of gait 05/10/2010   BACK PAIN 11/14/2008   Class 1 obesity due to excess calories with body mass index (BMI) of 31.0 to 31.9 in adult 02/07/2021   DIABETES MELLITUS, TYPE II 07/15/2008   Diplopia 07/15/2008   ECZEMA, ATOPIC 04/03/2009   GERD (gastroesophageal reflux disease)    Guillain-Barre 1988   HYPERLIPIDEMIA 03/06/2009   HYPERTENSION 07/15/2008   Stroke 2010, 2011   x2    Vertebral artery stenosis    Past Surgical History:  Past Surgical History:  Procedure Laterality Date   ABDOMINAL HYSTERECTOMY     BIOPSY  07/08/2022   Procedure: BIOPSY;  Surgeon: Benancio Deeds, MD;  Location: [facility];  Service: Gastroenterology;;   COLONOSCOPY WITH PROPOFOL N/A 07/08/2022   Procedure: COLONOSCOPY WITH PROPOFOL;  Surgeon: Benancio Deeds, MD;  Location: [facility];  Service: Gastroenterology;  Laterality: N/A;   DILATION AND CURETTAGE OF UTERUS     FOOT SURGERY     IR ANGIO INTRA EXTRACRAN SEL COM CAROTID INNOMINATE UNI L MOD SED  09/17/2021   IR ANGIO INTRA EXTRACRAN SEL INTERNAL CAROTID UNI R MOD SED  09/17/2021   IR ANGIO VERTEBRAL SEL VERTEBRAL UNI R MOD SED  09/17/2021   IR US GUIDE VASC ACCESS RIGHT  09/17/2021   HPI:  Pt is a 62 y.o. F who presents 08/21/2022 with worsening neck pain and HA and difficulty caring for self at home. PMH: multiple strokes in 2022, 2023 and 2024, HTN, HLD, IDDM, PVD, non-Hodgkin's lymphoma, sickle cell trait, CKD stage IIIa. No hx of swallowing assessments or dysphagia per chart review.    Assessment / Plan / Recommendation  Clinical Impression  Pt presents with normal swallowing function per clinical  assessment. There were no concerns for aspiration. She demonstrated thorough mastication with no residue post-swallow. Voice clear. No coughing. No dysphagia identified. Continue current diet. No SLP f/u needed. D/W RN. SLP Visit Diagnosis: Dysphagia, unspecified (R13.10)    Aspiration Risk  No limitations    Diet Recommendation     Medication Administration: Whole meds with liquid    Other  Recommendations Oral Care Recommendations: Oral care BID    Recommendations for follow up therapy are one component of a multi-disciplinary discharge planning process, led by the attending physician.  Recommendations may be updated based on patient status, additional functional criteria and insurance authorization.  Follow up Recommendations No SLP follow up        Swallow Study   General HPI: Pt is a 62 y.o. F who presents 08/21/2022 with worsening neck pain and HA and difficulty caring for self at home. PMH: multiple strokes in 2022, 2023 and 2024, HTN, HLD, IDDM, PVD, non-Hodgkin's lymphoma, sickle cell trait, CKD stage IIIa. No hx of swallowing assessments or dysphagia per chart review. Type of Study: Bedside Swallow Evaluation Previous Swallow Assessment: no Diet Prior to this Study: Regular;Thin liquids (Level 0) Temperature Spikes Noted: No Respiratory Status: Room air History of Recent Intubation: No Behavior/Cognition: Alert Oral Cavity Assessment: Within Functional Limits Oral Care Completed by SLP: No Oral Cavity - Dentition: Missing dentition Vision: Functional for self-feeding Self-Feeding Abilities: Able to feed self Patient Positioning: Partially reclined Baseline Vocal Quality:  Normal Volitional Cough: Strong Volitional Swallow: Able to elicit    Oral/Motor/Sensory Function Overall Oral Motor/Sensory Function: Within functional limits   Ice Chips Ice chips: Within functional limits   Thin Liquid Thin Liquid: Within functional limits    Nectar Thick Nectar Thick Liquid:  Not tested   Honey Thick Honey Thick Liquid: Not tested   Puree Puree: Within functional limits   Solid     Solid: Within functional limits      Blenda Mounts Laurice 08/22/2022,3:01 PM  Marchelle Folks L. Samson Frederic, MA CCC/SLP Clinical Specialist - Acute Care SLP Acute Rehabilitation Services Office number [phone]

## 2022-08-22 NOTE — Progress Notes (Signed)
Patients sister is now stating she wants to take patient home. Ms. Clovis Riley stated there's no point in waiting to see what her insurance company says for this admission because she knows her sister doesn't have anymore SNF days left. Ms. Clovis Riley stated she has to leave and go pick up her grandchild, but can pick patient up after 4:00 PM.

## 2022-08-22 NOTE — ED Notes (Signed)
Pt was changed into a clean brief, dressing put on her sacrum for comfort over her sore, dressed into disposable scrubs, given pain meds & helped into her sisters car for d/c.

## 2022-08-22 NOTE — ED Provider Notes (Addendum)
Emergency Medicine Observation Re-evaluation Note  Modean Zieske is a 62 y.o. female, seen on rounds today.  Pt initially presented to the ED for complaints of Neck Pain, Hand Pain, and Shortness of Breath Currently, the patient is awake and alert.  She came into the ED yesterday b/c family can't take care of her.  She has left hemiparesis and family is having a hard time providing the assistance to patient that is required.  Pt feels ok, but did not sleep much as she's in the hallway.  PT did eval patient yesterday and recommends SNF.  SW is working on placement.  Physical Exam  BP (!) 170/9   Pulse 81   Temp 97.7 F (36.5 C) (Oral)   Resp 16   Ht  (1.626 m)   Wt 71.7 kg   SpO2 100%   BMI 27.12 kg/m  Physical Exam General: awake and alert Cardiac: rr Lungs: clear Psych: calm  ED Course / MDM  EKG:EKG Interpretation  Date/Time:  Sunday August 21 2022 06:21:06 EDT Ventricular Rate:  83 PR Interval:  127 QRS Duration: 78 QT Interval:  380 QTC Calculation: 447 R Axis:   39 Text Interpretation: Sinus rhythm Borderline T abnormalities, inferior leads Baseline wander in lead(s) V3 similar to prior Confirmed by Tanda Rockers ([id number]) on 08/21/2022 7:43:05 AM  I have reviewed the labs performed to date as well as medications administered while in observation.  Recent changes in the last 24 hours include PT/SW eval.  Plan  Current plan is for SNF placement.    Jacalyn Lefevre, MD 08/22/22 [phone]   SW/CM did see pt today.  Pt's family is opting to take her home.  Home health orders placed.   JacaGuy Toney MD 08/22/22 1515

## 2022-08-22 NOTE — Progress Notes (Signed)
Patient and sister, Marina Gravel would like patient to go to [facility]. CSW contacted Whitney in admissions who will start patients insurance authorization today.

## 2022-08-22 NOTE — Progress Notes (Deleted)
08/22/2022 [name] [id number] [birth year]  Referring provider: [place], [name], MD Primary GI doctor: Dr. Lavon Paganini  ASSESSMENT AND PLAN:   There are no diagnoses linked to this encounter.   Patient Care Team: [place], [name], MD as PCP - General (Family Medicine) [place]endale Chard, DO as Consulting Physician (Neurology)  HISTORY OF PRESENT ILLNESS: 62 y.o. female with a past medical history of non-Hodgkin's lymphoma, PVD, hypertension, diabetes, hyperlipidemia, multiple strokes on Brillinta, Guillain-Barre syndrome, GERD and others listed below presents for evaluation of hospital follow-up for painless hematochezia.[name] She was in the hospital from 3/6 to 3/11 for painless hematochezia while on Brilinta.  07/08/2022 colonoscopy due to rectal bleeding and anemia on Brilinta for CVA shows 3 mm polyp ascending, 3 mm polyp descending, polypoid changes sigmoid colon, tortuous colon, poor anal tone difficult to insufflate left colon/rectum, few ulcers in distal rectum, internal hemorrhoids Pathology showed active proctitis/no evidence of chronicity, granuloma, dysplasia or malignancy.  Negative CMV. Patient started on Canasa and sucralfate suppositories. Patient {Actions; denies-reports:120008} family history of colon cancer or other gastrointestinal malignancies.   She {Actions; denies-reports:120008} blood thinner use.  She {Actions; denies-reports:120008} NSAID use.  She {Actions; denies-reports:120008} ETOH use.   She {Actions; denies-reports:120008} tobacco use.  She {Actions; denies-reports:120008} drug use.    She  reports that she has never smoked. She has never used smokeless tobacco. She reports that she does not drink alcohol and does not use drugs.  RELEVANT LABS AND IMAGING: CBC    Component Value Date/Time   WBC 7.4 08/21/2022 0703   RBC 4.36 08/21/2022 0703   HGB 11.1 (L) 08/21/2022 0703   HGB 11.5 (L) 03/11/2022 1248   HCT 34.5 (L) 08/21/2022 0703   PLT 305  08/21/2022 0703   PLT 256 03/11/2022 1248   MCV 79.1 (L) 08/21/2022 0703   MCH 25.5 (L) 08/21/2022 0703   MCHC 32.2 08/21/2022 0703   RDW 16.8 (H) 08/21/2022 0703   LYMPHSABS 2.7 08/21/2022 0703   MONOABS 0.5 08/21/2022 0703   EOSABS 0.3 08/21/2022 0703   BASOSABS 0.4 (H) 08/21/2022 0703   Recent Labs    07/06/22 1836 07/06/22 2050 07/07/22 0126 07/07/22 0830 07/07/22 1414 07/08/22 0129 07/09/22 0645 07/10/22 0113 07/11/22 0051 08/21/22 0703  HGB 11.8* 11.0* 10.3* 10.3* 10.1* 10.3* 9.7* 9.5* 10.0* 11.1*    CMP     Component Value Date/Time   NA 140 08/21/2022 0703   K 3.3 (L) 08/21/2022 0703   CL 108 08/21/2022 0703   CO2 23 08/21/2022 0703   GLUCOSE 135 (H) 08/21/2022 0703   GLUCOSE 129 (H) 04/28/2006 0951   BUN 9 08/21/2022 0703   CREATININE 0.73 08/21/2022 0703   CREATININE 1.47 (H) 03/11/2022 1248   CALCIUM 9.6 08/21/2022 0703   PROT 6.9 08/21/2022 0703   ALBUMIN 3.0 (L) 08/21/2022 0703   AST 32 08/21/2022 0703   AST 20 03/11/2022 1248   ALT 36 08/21/2022 0703   ALT 19 03/11/2022 1248   ALKPHOS 91 08/21/2022 0703   BILITOT 0.6 08/21/2022 0703   BILITOT 0.4 03/11/2022 1248   GFRNONAA >60 08/21/2022 0703   GFRNONAA 40 (L) 03/11/2022 1248   GFRAA >60 01/10/2020 1103      Latest Ref Rng & Units 08/21/2022    7:03 AM 07/07/2022    8:30 AM 07/06/2022    6:36 PM  Hepatic Function  Total Protein 6.5 - 8.1 g/dL 6.9  5.9  6.9   Albumin 3.5 - 5.0 g/dL 3.0  2.4  2.8   AST 15 - 41 U/L 32  21  24   ALT 0 - 44 U/L 36  26  29   Alk Phosphatase 38 - 126 U/L 91  80  89   Total Bilirubin 0.3 - 1.2 mg/dL 0.6  0.6  0.7       Current Medications:    Current Outpatient Medications (Endocrine & Metabolic):    Dulaglutide (TRULICITY) 0.75 MG/0.5ML SOPN, Inject 0.75 mg into the skin once a week. (Patient taking differently: Inject 0.75 mg into the skin once a week. Friday)   insulin aspart (NOVOLOG) 100 UNIT/ML injection, Inject 0-9 Units into the skin 3 (three) times  daily with meals. 0-9 Units, Subcutaneous, 3 times daily with meals, First dose on Thu 07/07/22 at 0800 Correction coverage: Sensitive (thin, NPO, renal) CBG < 70: Implement Hypoglycemia Standing Orders and refer to Hypoglycemia Standing Orders sidebar report CBG 70 - 120: 0 units CBG 121 - 150: 1 unit CBG 151 - 200: 2 units CBG 201 - 250: 3 units CBG 251 - 300: 5 units CBG 301 - 350: 7 units CBG 351 - 400: 9 units CBG > 400: call MD.   insulin glargine-yfgn (SEMGLEE) 100 UNIT/ML injection, Inject 0.05 mLs (5 Units total) into the skin daily. (Patient taking differently: Inject 8 Units into the skin daily.)  Facility-Administered Medications Ordered in Other Visits (Endocrine & Metabolic):    insulin aspart (novoLOG) injection 0-9 Units   insulin glargine-yfgn (SEMGLEE) injection 5 Units   Current Outpatient Medications (Cardiovascular):    amLODipine (NORVASC) 10 MG tablet, Take 1 tablet by mouth once daily   atorvastatin (LIPITOR) 80 MG tablet, Take 1 tablet (80 mg total) by mouth daily.   ezetimibe (ZETIA) 10 MG tablet, Take 1 tablet (10 mg total) by mouth daily.   hydrALAZINE (APRESOLINE) 50 MG tablet, TAKE 1 TABLET BY MOUTH THREE TIMES DAILY  Facility-Administered Medications Ordered in Other Visits (Cardiovascular):    amLODipine (NORVASC) tablet 10 mg   atorvastatin (LIPITOR) tablet 80 mg   ezetimibe (ZETIA) tablet 10 mg   hydrALAZINE (APRESOLINE) tablet 50 mg     Current Outpatient Medications (Analgesics):    acetaminophen (TYLENOL) 325 MG tablet, Take 650 mg by mouth every 4 (four) hours as needed for mild pain. Do not exceed  in 24 hours   aspirin EC 81 MG tablet, Take 1 tablet (81 mg total) by mouth daily. Swallow whole. (Patient not taking: Reported on 08/22/2022)   traMADol (ULTRAM) 50 MG tablet, Take 25 mg by mouth every 6 (six) hours as needed for moderate pain or severe pain.  Facility-Administered Medications Ordered in Other Visits (Analgesics):    aspirin EC tablet  81 mg   Current Outpatient Medications (Hematological):    ticagrelor (BRILINTA) 90 MG TABS tablet, Take 1 tablet (90 mg total) by mouth 2 (two) times daily.  Facility-Administered Medications Ordered in Other Visits (Hematological):    ticagrelor (BRILINTA) tablet 90 mg   Current Outpatient Medications (Other):    AMBULATORY NON FORMULARY MEDICATION, Medication Name: Carafate 50 mg suppositories: Insert 1 suppository rectally twice a day for 2 weeks   B Complex Vitamins (VITAMIN B COMPLEX) TABS, Take 1 tablet by mouth every morning.   Blood Pressure Monitoring (BLOOD PRESSURE MONITOR AUTOMAT) DEVI, 1 Device by Does not apply route daily.   Cholecalciferol (VITAMIN D3 PO), Take 1 capsule by mouth every morning.   FLUoxetine (PROZAC) 20 MG capsule, Take 1 capsule by mouth once daily  glucose blood (CONTOUR NEXT TEST) test strip, 1 each by Other route 2 (two) times daily. And lancets 2/day   glucose blood (ONETOUCH VERIO) test strip, USE TO CHECK BLOOD SUGAR TWICE A DAY AND PRN   hydrOXYzine (ATARAX) 50 MG tablet, Take 50 mg by mouth 2 (two) times daily.   loperamide (IMODIUM) 2 MG capsule, Take 2 capsules (4 mg total) by mouth 3 (three) times daily as needed for diarrhea or loose stools. (Patient not taking: Reported on 08/22/2022)   methocarbamol (ROBAXIN) 500 MG tablet, Take 1 tablet (500 mg total) by mouth 2 (two) times daily.   omeprazole (PRILOSEC OTC) 20 MG tablet, Take 20 mg by mouth daily.   ONE TOUCH LANCETS MISC, USE TO CHECK BLOOD SUGAR TWICE A DAY AND PRN   saccharomyces boulardii (FLORASTOR) 250 MG capsule, Take 1 capsule (250 mg total) by mouth 2 (two) times daily. (Patient not taking: Reported on 08/22/2022)   tiZANidine (ZANAFLEX) 2 MG tablet, Take 1 tablet by mouth every 8 (eight) hours as needed for muscle spasms.  Facility-Administered Medications Ordered in Other Visits (Other):    FLUoxetine (PROZAC) capsule 20 mg   pantoprazole (PROTONIX) EC tablet 40 mg No current  facility-administered medications for this visit.  Medical History:  Past Medical History:  Diagnosis Date   Abnormality of gait 05/10/2010   BACK PAIN 11/14/2008   Class 1 obesity due to excess calories with body mass index (BMI) of 31.0 to 31.9 in adult 02/07/2021   DIABETES MELLITUS, TYPE II 07/15/2008   Diplopia 07/15/2008   ECZEMA, ATOPIC 04/03/2009   GERD (gastroesophageal reflux disease)    Guillain-Barre 1988   HYPERLIPIDEMIA 03/06/2009   HYPERTENSION 07/15/2008   Stroke 2010, 2011   x2    Vertebral artery stenosis    Allergies: No Known Allergies   Surgical History:  She  has a past surgical history that includes Abdominal hysterectomy; Foot surgery; Dilation and curettage of uterus; IR ANGIO INTRA EXTRACRAN SEL INTERNAL CAROTID UNI R MOD SED (09/17/2021); IR US Guide Vasc Access Right (09/17/2021); IR ANGIO VERTEBRAL SEL VERTEBRAL UNI R MOD SED (09/17/2021); IR ANGIO INTRA EXTRACRAN SEL COM CAROTID INNOMINATE UNI L MOD SED (09/17/2021); Colonoscopy with propofol (N/A, 07/08/2022); and biopsy (07/08/2022). Family History:  Her family history includes Asthma in an other family member; Cancer in her father; Diabetes in her mother and sister; Hypertension in an other family member; Stroke in her mother and another family member.  REVIEW OF SYSTEMS  : All other systems reviewed and negative except where noted in the History of Present Illness.  PHYSICAL EXAM: There were no vitals taken for this visit. General Appearance: Well nourished, in no apparent distress. Head:   Normocephalic and atraumatic. Eyes:  sclerae anicteric,conjunctive pink  Respiratory: Respiratory effort normal, BS equal bilaterally without rales, rhonchi, wheezing. Cardio: RRR with no MRGs. Peripheral pulses intact.  Abdomen: Soft,  {BlankSingle:19197::"Flat","Obese","Non-distended"} ,active bowel sounds. {actendernessAB:27319} tenderness {anatomy; site abdomen:5010}. {BlankMultiple:19196::"Without guarding","With  guarding","Without rebound","With rebound"}. No masses. Rectal: {acrectalexam:27461} Musculoskeletal: Full ROM, {PSY - GAIT AND STATION:22860} gait. {With/Without:304960234} edema. Skin:  Dry and intact without significant lesions or rashes Neuro: Alert and  oriented x4;  No focal deficits. Psych:  Cooperative. Normal mood and affect.    Doree Albee, PA-C 11:45 AM

## 2022-08-22 NOTE — Progress Notes (Signed)
Physical Therapy Treatment Patient Details Name: [name] MRN: [id number] DOB: 07-31-1960 Today's Date: 08/22/2022   History of Present Illness Pt is a 62 y.o. F who presents 08/21/2022 with worsening neck pain and HA and difficulty caring for self at home. PMH: multiple strokes in 2022, 2023 and 2024, HTN, HLD, IDDM, PVD, non-Hodgkin's lymphoma, sickle cell trait, CKD stage IIIa    PT Comments    Pt tolerates treatment well, progressing to transfer training this session. Pt does continue to require physical assistance for all functional mobility, but is able to stand with knee block and 2 person assist. Pt will benefit from continued aggressive mobilization in an effort to improve transfer quality and reduce caregiver burden. PT continues to recommend short term inpatient PT services at the time of discharge.   Recommendations for follow up therapy are one component of a multi-disciplinary discharge planning process, led by the attending physician.  Recommendations may be updated based on patient status, additional functional criteria and insurance authorization.  Follow Up Recommendations  Can patient physically be transported by private vehicle: No    Assistance Recommended at Discharge Frequent or constant Supervision/Assistance  Patient can return home with the following Two people to help with walking and/or transfers;Two people to help with bathing/dressing/bathroom   Equipment Recommendations  None recommended by PT    Recommendations for Other Services       Precautions / Restrictions Precautions Precautions: Fall;Other (comment) Precaution Comments: L hemiparesis Restrictions Weight Bearing Restrictions: No     Mobility  Bed Mobility Overal bed mobility: Needs Assistance Bed Mobility: Rolling, Supine to Sit, Sit to Supine Rolling: Mod assist   Supine to sit: Max assist Sit to supine: Max assist   General bed mobility comments: posterior lean at edge of bed  initially, requires support to prevent posterior loss of balance. Pt respondes well to verbal cues for anterior weight shift.    Transfers Overall transfer level: Needs assistance Equipment used: 2 person hand held assist Transfers: Sit to/from Stand Sit to Stand: +2 physical assistance, Mod assist           General transfer comment: bilateral LE block, PT notes instability at L knee, pt appears to shift weight primarily to right side    Ambulation/Gait Ambulation/Gait assistance: Max assist, +2 physical assistance Gait Distance (Feet): 1 Feet Assistive device: 2 person hand held assist Gait Pattern/deviations: Shuffle Gait velocity: reduced Gait velocity interpretation: <1.31 ft/sec, indicative of household ambulator   General Gait Details: pt assisted in side stepping to right side for 1-2 steps, reduced foot clearance, L knee buckling noted   Stairs             Wheelchair Mobility    Modified Rankin (Stroke Patients Only)       Balance Overall balance assessment: Needs assistance Sitting-balance support: Single extremity supported, Feet unsupported Sitting balance-Leahy Scale: Poor Sitting balance - Comments: minA, posterior lean, one rightward loss of balance Postural control: Posterior lean Standing balance support: Bilateral upper extremity supported Standing balance-Leahy Scale: Poor Standing balance comment: modA x2                            Cognition Arousal/Alertness: Awake/alert Behavior During Therapy: WFL for tasks assessed/performed Overall Cognitive Status: Within Functional Limits for tasks assessed  General Comments: family present and confirms all answers from patient        Exercises      General Comments General comments (skin integrity, edema, etc.): skin breakdown noted at sacrum, RN made aware via secure chat. Pt with incontinence of stool and urine, assiste in  peri-care      Pertinent Vitals/Pain Pain Assessment Pain Assessment: Faces Faces Pain Scale: Hurts little more Pain Location: buttock Pain Descriptors / Indicators: Discomfort Pain Intervention(s): Monitored during session    Home Living Family/patient expects to be discharged to:: Skilled nursing facility Living Arrangements: Other relatives (niece) Available Help at Discharge: Family;Available PRN/intermittently Type of Home: House Home Access: Stairs to enter   Entrance Stairs-Number of Steps: 3   Home Layout: One level Home Equipment: Hospital bed;Wheelchair - manual (hoyer/ electric hospital bed) Additional Comments: Pt reports that her niece has been lifting her from bed to chair without hoyer lift. Niece works at a SNF facility as CNA per family present. Sister also (A) with care but does not physically (A). pt takes bath at bed level    Prior Function            PT Goals (current goals can now be found in the care plan section) Acute Rehab PT Goals Patient Stated Goal: pt sister would like her to go back to rehab Progress towards PT goals: Progressing toward goals    Frequency    Min 2X/week      PT Plan Current plan remains appropriate    Co-evaluation PT/OT/SLP Co-Evaluation/Treatment: Yes Reason for Co-Treatment: Complexity of the patient's impairments (multi-system involvement);Necessary to address cognition/behavior during functional activity;For patient/therapist safety;To address functional/ADL transfers PT goals addressed during session: Mobility/safety with mobility;Balance;Strengthening/ROM        AM-PAC PT "6 Clicks" Mobility   Outcome Measure  Help needed turning from your back to your side while in a flat bed without using bedrails?: A Lot Help needed moving from lying on your back to sitting on the side of a flat bed without using bedrails?: A Lot Help needed moving to and from a bed to a chair (including a wheelchair)?: Total Help  needed standing up from a chair using your arms (e.g., wheelchair or bedside chair)?: Total Help needed to walk in hospital room?: Total Help needed climbing 3-5 steps with a railing? : Total 6 Click Score: 8    End of Session Equipment Utilized During Treatment: Gait belt Activity Tolerance: Patient tolerated treatment well Patient left: in bed;with call bell/phone within reach Nurse Communication: Mobility status PT Visit Diagnosis: Other abnormalities of gait and mobility (R26.89);Pain Pain - Right/Left:  (buttocks)     Time: 1610-9604 PT Time Calculation (min) (ACUTE ONLY): 30 min  Charges:  $Therapeutic Activity: 8-22 mins                     Arlyss Gandy, PT, DPT Acute Rehabilitation Office [phone]    Arlyss Gandy 08/22/2022, 11:05 AM

## 2022-08-22 NOTE — Evaluation (Signed)
Occupational Therapy Evaluation Patient Details Name: [name] MRN: [id number] DOB: 18-May-1960 Today's Date: 08/22/2022   History of Present Illness Pt is a 62 y.o. F who presents 08/21/2022 with worsening neck pain and HA and difficulty caring for self at home. PMH: multiple strokes in 2022, 2023 and 2024, HTN, HLD, IDDM, PVD, non-Hodgkin's lymphoma, sickle cell trait, CKD stage IIIa   Clinical Impression   PT admitted with neck pain and HA. Pt currently with functional limitiations due to the deficits listed below (see OT problem list). Pt with d/c home from [facility] 08/17/22 with family (A) required for all adls and transfers. Pt reports max static standing at facility 10 minutes. Pt oob to w/c daily and pt prefers oob to chair. Pt currently expresses concerns with void of bowels and discomfort at rectum. Pt noted to have some early signs of skin break down at sacrum and could benefit from cushion/ pressure relief.  Pt will benefit from skilled OT to increase their independence and safety with adls and balance to allow discharge skilled inpatient follow up therapy, <3 hours/day.  .       Recommendations for follow up therapy are one component of a multi-disciplinary discharge planning process, led by the attending physician.  Recommendations may be updated based on patient status, additional functional criteria and insurance authorization.   Assistance Recommended at Discharge Intermittent Supervision/Assistance  Patient can return home with the following A lot of help with walking and/or transfers;A lot of help with bathing/dressing/bathroom    Functional Status Assessment  Patient has had a recent decline in their functional status and demonstrates the ability to make significant improvements in function in a reasonable and predictable amount of time.  Equipment Recommendations  BSC/3in1    Recommendations for Other Services       Precautions / Restrictions  Precautions Precautions: Fall;Other (comment) Precaution Comments: L hemiparesis      Mobility Bed Mobility Overal bed mobility: Needs Assistance Bed Mobility: Rolling, Supine to Sit, Sit to Supine Rolling: Mod assist   Supine to sit: Max assist Sit to supine: Max assist   General bed mobility comments: pt with posterior lean on eob and needs (A) to remain static sitting. pt with cues to anterior weight shift. pt unable to push up from R side LOB to midline. pt with min (A) to sustain static sitting with short periods of min guard    Transfers Overall transfer level: Needs assistance Equipment used: 2 person hand held assist Transfers: Sit to/from Stand Sit to Stand: +2 physical assistance, Mod assist           General transfer comment: blocking bil LE. pt with activation bil LE. pt able to weight shift toward R and bring LLE adducted to step toward HOB.      Balance Overall balance assessment: Needs assistance Sitting-balance support: Single extremity supported, Feet supported Sitting balance-Leahy Scale: Poor     Standing balance support: During functional activity, Reliant on assistive device for balance, Single extremity supported Standing balance-Leahy Scale: Poor                             ADL either performed or assessed with clinical judgement   ADL Overall ADL's : Needs assistance/impaired Eating/Feeding: Set up   Grooming: Applying deodorant   Upper Body Bathing: Moderate assistance   Lower Body Bathing: Maximal assistance   Upper Body Dressing : Moderate assistance   Lower Body Dressing: Maximal  assistance         Toileting - Clothing Manipulation Details (indicate cue type and reason): pt verbalized incontinence on arrival and asked to be placed in a room for peri care prior to session. pt noted to have stool that was not progressing until wiping occurred to simulate bowl movement. pt reports having discomfort at rectum and wanting it  assessed while acutely admitted.             Vision Baseline Vision/History: 1 Wears glasses (reading)       Perception     Praxis      Pertinent Vitals/Pain Pain Assessment Pain Assessment: Faces Faces Pain Scale: Hurts a little bit Pain Location: neck, buttock Pain Descriptors / Indicators: Discomfort, Guarding Pain Intervention(s): Monitored during session, Repositioned     Hand Dominance Right   Extremity/Trunk Assessment Upper Extremity Assessment Upper Extremity Assessment: LUE deficits/detail LUE Deficits / Details: wears a splint 3 hours per day. advised to wear for sleeping. pt with full ROM of shoulder elbow wrist and digits. pt at rest holding in an adducted flexed syngery pattern. pt able to demonstrate AAROM for reaching for face   Lower Extremity Assessment Lower Extremity Assessment: Defer to PT evaluation   Cervical / Trunk Assessment Cervical / Trunk Assessment: Kyphotic   Communication Communication Communication: Expressive difficulties   Cognition Arousal/Alertness: Awake/alert Behavior During Therapy: WFL for tasks assessed/performed Overall Cognitive Status: Within Functional Limits for tasks assessed                                 General Comments: family present and confirms all answers from patient     General Comments  pt at risk for skin break down on buttock at this time due to prolonged bed level. pt could benefit from pad and RN made aware. OT unable to locate pad in ED closet at this time    Exercises     Shoulder Instructions      Home Living Family/patient expects to be discharged to:: Skilled nursing facility Living Arrangements: Other relatives (niece) Available Help at Discharge: Family;Available PRN/intermittently Type of Home: House Home Access: Stairs to enter [employer] of Steps: 3   Home Layout: One level     Bathroom Shower/Tub: Chief Strategy Officer: Standard      Home Equipment: Hospital bed;Wheelchair - manual (hoyer/ electric hospital bed)   Additional Comments: Pt reports that her niece has been lifting her from bed to chair without hoyer lift. Niece works at a SNF facility as CNA per family present. Sister also (A) with care but does not physically (A). pt takes bath at bed level      Prior Functioning/Environment Prior Level of Function : Needs assist             Mobility Comments: hoyer lift OOB or sit to stand lift with therapy ADLs Comments: pt dependent for (A) with LB adls and wears briefs with incontinence of bowel/ bladder        OT Problem List: Decreased strength;Decreased activity tolerance;Impaired balance (sitting and/or standing);Decreased safety awareness;Decreased knowledge of use of DME or AE;Decreased knowledge of precautions      OT Treatment/Interventions: Self-care/ADL training;Therapeutic exercise;Energy conservation;DME and/or AE instruction;Manual therapy;Neuromuscular education;Modalities;Splinting;Therapeutic activities;Patient/family education;Balance training    OT Goals(Current goals can be found in the care plan section) Acute Rehab OT Goals Patient Stated Goal: to be able to get oob again OT  Goal Formulation: With patient/family Time For Goal Achievement: 09/05/22 Potential to Achieve Goals: Good  OT Frequency: Min 2X/week    Co-evaluation              AM-PAC OT "6 Clicks" Daily Activity     Outcome Measure Help from another person eating meals?: A Little Help from another person taking care of personal grooming?: A Little Help from another person toileting, which includes using toliet, bedpan, or urinal?: A Lot Help from another person bathing (including washing, rinsing, drying)?: A Lot Help from another person to put on and taking off regular upper body clothing?: A Lot Help from another person to put on and taking off regular lower body clothing?: A Lot 6 Click Score: 14   End of Session  Nurse Communication: Mobility status;Weight bearing status;Precautions;Need for lift equipment  Activity Tolerance: Patient tolerated treatment well Patient left: in bed;with call bell/phone within reach;with family/visitor present  OT Visit Diagnosis: Unsteadiness on feet (R26.81);Muscle weakness (generalized) (M62.81)                Time: 1478-2956 OT Time Calculation (min): 30 min Charges:  OT General Charges $OT Visit: 1 Visit OT Evaluation $OT Eval Moderate Complexity: 1 Mod   Brynn, OTR/L  Acute Rehabilitation Services Office: [phone] .   Mateo Flow 08/22/2022, 10:53 AM

## 2022-08-23 NOTE — Progress Notes (Unsigned)
Chief Complaint  Patient presents with   rehab follow up   HPI:  [name] is a 62 y.o. female with PMHx significant for HTN,HLD,CVA, PAD, CKD III,sickle cell trait, and CLL here today with her sister and niece to follow on recent ED visit and SNF discharged. Since her last visit, 03/16/22, she has been hospitalized a few times and completed rehab in a local SNF. 05/29/22-06/04/22:Presented to the ED for dizziness and diplopia, Dx'ed with acute right medullary CVA. 06/12/22-06/17/22 with new onset of right-sided weakness and worsening of dysarthria and drooling at [facility] hospital inpatient rehab unit. 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   06/12/22-06/21/22 for aspiration pneumonia.  07/06/22-07/11/22 rectal bleeding from rectal ulcers. Underwent colonoscopy 07/11/22.  -Continues with rectal severe pain, intermittent, lasts a few seconds. Cancelled appt with GI for today because conflict with this appt. According to her sister, she was supposed to be on suppositories medication while she was at the SNF but apparently was not done. She denies any blood in the stool or melena.  She reports inconsistent bowel movements, with stools sometimes being hard and "balled up", and sometimes loose. She has a bowel movement after meals. Negative for abdominal pain,nausea,or vomiting.  -CVD with residual weakness on her left side and part of her face. She experiences muscle spasms and stiffness around her neck.  She was recently discharged from a facility and is currently living with her sister. She is considering moving to a nursing home, her sister cannot provider care at home.  She is not sure about next f/u appt with neurologist. She is on Brilinta 90 mg bid, Zetia 10 mg daily,and Lipitor 80 mg daily. She is not on aspirin and Plavix was discontinued.  Lab Results  Component Value Date   CHOL 126 06/13/2022   HDL 28 (L) 06/13/2022   LDLCALC 81  06/13/2022   LDLDIRECT 175.9 03/02/2012   TRIG 84 06/13/2022   CHOLHDL 4.5 06/13/2022   -DM II: She is currently taking NovoLog per sliding scale, about 3 U before meals and Semglee 8 U daily She is not on Trulicity. Reports that her BS's have improved, 150's. Lab Results  Component Value Date   HGBA1C 11.4 (H) 05/30/2022   She has an upcoming appointment with an endocrinologist. She has also experienced weight loss,attributed to changes in her diet. Her sister and nice are cooking for her.  which may have contributed to better blood sugar control.  Hypokalemia: She is not currently taking potassium supplements. HTN on Amlodipine 10 mg daily and Hydralazine 50 mg tid. Her niece is checking BP at home and states that BP's are good, does not provide information about specific readings.  Evaluated in the ED on 08/21/22 for neck pain and muscle spasms. She was prescribed Zanaflex 2 mg tid prn.  Lab Results  Component Value Date   CREATININE 0.73 08/21/2022   BUN 9 08/21/2022   NA 140 08/21/2022   K 3.3 (L) 08/21/2022   CL 108 08/21/2022   CO2 23 08/21/2022   Lab Results  Component Value Date   WBC 7.4 08/21/2022   HGB 11.1 (L) 08/21/2022   HCT 34.5 (L) 08/21/2022   MCV 79.1 (L) 08/21/2022   PLT 305 08/21/2022   Review of Systems  Constitutional:  Positive for fatigue. Negative for chills and fever.  HENT:  Negative for sore throat.   Respiratory:  Negative for cough and wheezing.   Endocrine: Negative for cold  intolerance, heat intolerance, polydipsia, polyphagia and polyuria.  Genitourinary:  Negative for decreased urine volume, dysuria and hematuria.  Musculoskeletal:  Positive for arthralgias and neck pain.  Skin:  Negative for rash.  Neurological:  Negative for syncope.  See other pertinent positives and negatives in HPI.  Current Outpatient Medications on File Prior to Visit  Medication Sig Dispense Refill   acetaminophen (TYLENOL) 325 MG tablet Take 650 mg by mouth  every 4 (four) hours as needed for mild pain. Do not exceed 3000mg  in 24 hours     AMBULATORY NON FORMULARY MEDICATION Medication Name: Carafate 50 mg suppositories: Insert 1 suppository rectally twice a day for 2 weeks 28 suppository 0   amLODipine (NORVASC) 10 MG tablet Take 1 tablet by mouth once daily 90 tablet 1   aspirin EC 81 MG tablet Take 1 tablet (81 mg total) by mouth daily. Swallow whole. 30 tablet 12   atorvastatin (LIPITOR) 80 MG tablet Take 1 tablet (80 mg total) by mouth daily. 30 tablet 3   B Complex Vitamins (VITAMIN B COMPLEX) TABS Take 1 tablet by mouth every morning.     Blood Pressure Monitoring (BLOOD PRESSURE MONITOR AUTOMAT) DEVI 1 Device by Does not apply route daily. 1 Device 0   Cholecalciferol (VITAMIN D3 PO) Take 1 capsule by mouth every morning.     Dulaglutide (TRULICITY) 0.75 MG/0.5ML SOPN Inject 0.75 mg into the skin once a week. (Patient taking differently: Inject 0.75 mg into the skin once a week. Friday) 6 mL 3   ezetimibe (ZETIA) 10 MG tablet Take 1 tablet (10 mg total) by mouth daily. 30 tablet 3   FLUoxetine (PROZAC) 20 MG capsule Take 1 capsule by mouth once daily 90 capsule 2   glucose blood (CONTOUR NEXT TEST) test strip 1 each by Other route 2 (two) times daily. And lancets 2/day 200 each 3   glucose blood (ONETOUCH VERIO) test strip USE TO CHECK BLOOD SUGAR TWICE A DAY AND PRN 100 each 6   hydrALAZINE (APRESOLINE) 50 MG tablet TAKE 1 TABLET BY MOUTH THREE TIMES DAILY 90 tablet 2   hydrOXYzine (ATARAX) 50 MG tablet Take 50 mg by mouth 2 (two) times daily.     insulin aspart (NOVOLOG) 100 UNIT/ML injection Inject 0-9 Units into the skin 3 (three) times daily with meals. 0-9 Units, Subcutaneous, 3 times daily with meals, First dose on Thu 07/07/22 at 0800 Correction coverage: Sensitive (thin, NPO, renal) CBG < 70: Implement Hypoglycemia Standing Orders and refer to Hypoglycemia Standing Orders sidebar report CBG 70 - 120: 0 units CBG 121 - 150: 1 unit CBG 151 -  200: 2 units CBG 201 - 250: 3 units CBG 251 - 300: 5 units CBG 301 - 350: 7 units CBG 351 - 400: 9 units CBG > 400: call MD.     insulin glargine-yfgn (SEMGLEE) 100 UNIT/ML injection Inject 0.05 mLs (5 Units total) into the skin daily. (Patient taking differently: Inject 8 Units into the skin daily.)     loperamide (IMODIUM) 2 MG capsule Take 2 capsules (4 mg total) by mouth 3 (three) times daily as needed for diarrhea or loose stools. 30 capsule 0   methocarbamol (ROBAXIN) 500 MG tablet Take 1 tablet (500 mg total) by mouth 2 (two) times daily. 20 tablet 0   omeprazole (PRILOSEC OTC) 20 MG tablet Take 20 mg by mouth daily.     ONE TOUCH LANCETS MISC USE TO CHECK BLOOD SUGAR TWICE A DAY AND PRN 100 each 6  saccharomyces boulardii (FLORASTOR) 250 MG capsule Take 1 capsule (250 mg total) by mouth 2 (two) times daily.     ticagrelor (BRILINTA) 90 MG TABS tablet Take 1 tablet (90 mg total) by mouth 2 (two) times daily. 60 tablet    tiZANidine (ZANAFLEX) 2 MG tablet Take 1 tablet by mouth every 8 (eight) hours as needed for muscle spasms.     traMADol (ULTRAM) 50 MG tablet Take 25 mg by mouth every 6 (six) hours as needed for moderate pain or severe pain.     No current facility-administered medications on file prior to visit.   Past Medical History:  Diagnosis Date   Abnormality of gait 05/10/2010   BACK PAIN 11/14/2008   Class 1 obesity due to excess calories with body mass index (BMI) of 31.0 to 31.9 in adult 02/07/2021   DIABETES MELLITUS, TYPE II 07/15/2008   Diplopia 07/15/2008   ECZEMA, ATOPIC 04/03/2009   GERD (gastroesophageal reflux disease)    Guillain-Barre 1988   HYPERLIPIDEMIA 03/06/2009   HYPERTENSION 07/15/2008   Stroke 2010, 2011   x2    Vertebral artery stenosis    No Known Allergies  Social History   Socioeconomic History   Marital status: Divorced    Spouse name: Not on file   Number of children: 0   Years of education: 13   Highest education level: Not on file   Occupational History   Occupation: custodian at [employer]  Tobacco Use   Smoking status: Never   Smokeless tobacco: Never  Vaping Use   Vaping Use: Never used  Substance and Sexual Activity   Alcohol use: No   Drug use: No   Sexual activity: Not Currently  Other Topics Concern   Not on file  Social History Narrative   Right Handed    Lives in a one story home    No caffeine   Social Determinants of Health   Financial Resource Strain: Not on file  Food Insecurity: No Food Insecurity (05/29/2022)   Hunger Vital Sign    Worried About Running Out of Food in the Last Year: Never true    Ran Out of Food in the Last Year: Never true  Transportation Needs: No Transportation Needs (05/29/2022)   PRAPARE - Administrator, Civil Service (Medical): No    Lack of Transportation (Non-Medical): No  Physical Activity: Not on file  Stress: Not on file  Social Connections: Not on file   Vitals:   08/24/22 1107  BP: 134/80  Pulse: (!) 105  Resp: 16  SpO2: 97%   Body mass index is 27.12 kg/m.  Physical Exam Vitals and nursing note reviewed.  Constitutional:      General: She is not in acute distress.    Appearance: She is well-developed.  HENT:     Head: Normocephalic and atraumatic.     Mouth/Throat:     Mouth: Mucous membranes are moist.     Dentition: Abnormal dentition.     Pharynx: Oropharynx is clear.  Eyes:     Conjunctiva/sclera: Conjunctivae normal.  Cardiovascular:     Rate and Rhythm: Normal rate and regular rhythm.     Heart sounds: No murmur heard.    Comments: HR 88/min Pulmonary:     Effort: Pulmonary effort is normal. No respiratory distress.     Breath sounds: Normal breath sounds.  Abdominal:     Palpations: Abdomen is soft. There is no mass.     Tenderness: There is  no abdominal tenderness.  Musculoskeletal:     Cervical back: Pain with movement present. Decreased range of motion.  Skin:    General: Skin is warm.     Findings:  No erythema or rash.  Neurological:     Mental Status: She is alert and oriented to person, place, and time.     Cranial Nerves: Dysarthria present.     Motor: Weakness present.     Comments: Left-sided hemiparesis, upper and lower extremities about 2-3/5 strength.  Sh eis in a wheele chair.  Psychiatric:        Mood and Affect: Mood and affect normal.   ASSESSMENT AND PLAN:  [name] was seen today for rehab follow up.  Diagnoses and all orders for this visit: Lab Results  Component Value Date   CREATININE 0.73 08/21/2022   BUN 9 08/21/2022   NA 140 08/21/2022   K 4.0 08/24/2022   CL 108 08/21/2022   CO2 23 08/21/2022    Hemiparesis affecting left side as late effect of stroke Assessment & Plan: She has had several CVA with residual dizziness and dysarthria, the last one with left-sided weakness. She needs assistance with ADL's and IADL's,  her sister cannot provide the care she needs at this time. She is planning on NH placement, FL2 will be completed. Continue Brilinta 9 mg bid , Zetia 10 mg daily,and Atorvastatin 80 mg daily. Stressed the importance of adequate BP and glucose controlled.   Essential hypertension Assessment & Plan: BP today mildly elevated, BP's at home reported as "good." For now continue Amlodipine 10 mg daily and Hydralazine 50 mg tid and low salt diet. Continue monitoring BP regularly.   DM (diabetes mellitus), type 2 with peripheral vascular complications Assessment & Plan: Problem has not been well controlled. Last HgA1C 11.4 in 05/2022. Her niece reports BS's 150's. No changes in Semglee or Novolog dose. Keep appt with endocrinologist next week.  Rectal ulcer Assessment & Plan: Seen on colonoscopy. Symptomatic. According to her niece, Bx results were negative for IBD or malignancy. Her sister is planning on re-scheduling appt with GI. Hydrocortisone suppositories recommended bid prn. Adequate hygiene after defecation, avoid  straining.   Neck pain CT/neck soft tissue done on 08/21/22 showed cervical lymphadenopathy now very similar to a 2021 Neck CT and no other acute or inflammatory process. Continue Zanaflex 2 mg tid prn, side effects discussed. PT will be added to plan of care once she moves to NH. ROM exercises recommended for now.  Hypokalemia Potassium rich diet to continue for now. Further recommendations will be given according to lab result.  -     Potassium; Future  I spent a total of 43 minutes in both face to face and non face to face activities for this visit on the date of this encounter. During this time history was obtained and documented, examination was performed, prior labs/imaging reviewed, and assessment/plan discussed.  Return in about 2 months (around 10/24/2022) for chronic problems.  Nguyen Butler G. [place], MD  [facility]. [place] office.

## 2022-08-24 ENCOUNTER — Ambulatory Visit (INDEPENDENT_AMBULATORY_CARE_PROVIDER_SITE_OTHER): Payer: Commercial Managed Care - HMO | Admitting: Family Medicine

## 2022-08-24 ENCOUNTER — Encounter: Payer: Self-pay | Admitting: Family Medicine

## 2022-08-24 ENCOUNTER — Telehealth: Payer: Self-pay | Admitting: Family Medicine

## 2022-08-24 ENCOUNTER — Ambulatory Visit: Payer: Commercial Managed Care - HMO | Admitting: Physician Assistant

## 2022-08-24 VITALS — BP 134/80 | HR 105 | Resp 16 | Ht 64.0 in | Wt 158.0 lb

## 2022-08-24 DIAGNOSIS — I10 Essential (primary) hypertension: Secondary | ICD-10-CM | POA: Diagnosis not present

## 2022-08-24 DIAGNOSIS — E1151 Type 2 diabetes mellitus with diabetic peripheral angiopathy without gangrene: Secondary | ICD-10-CM | POA: Diagnosis not present

## 2022-08-24 DIAGNOSIS — Z794 Long term (current) use of insulin: Secondary | ICD-10-CM | POA: Diagnosis not present

## 2022-08-24 DIAGNOSIS — M542 Cervicalgia: Secondary | ICD-10-CM

## 2022-08-24 DIAGNOSIS — I69354 Hemiplegia and hemiparesis following cerebral infarction affecting left non-dominant side: Secondary | ICD-10-CM | POA: Diagnosis not present

## 2022-08-24 DIAGNOSIS — E876 Hypokalemia: Secondary | ICD-10-CM | POA: Diagnosis not present

## 2022-08-24 DIAGNOSIS — K626 Ulcer of anus and rectum: Secondary | ICD-10-CM

## 2022-08-24 LAB — POTASSIUM: Potassium: 4 mEq/L (ref 3.5–5.1)

## 2022-08-24 MED ORDER — HYDROCORTISONE ACETATE 25 MG RE SUPP
25.0000 mg | Freq: Two times a day (BID) | RECTAL | 0 refills | Status: DC | PRN
Start: 2022-08-24 — End: 2022-08-24

## 2022-08-24 MED ORDER — HYDROCORTISONE ACETATE 25 MG RE SUPP
25.0000 mg | Freq: Two times a day (BID) | RECTAL | 0 refills | Status: AC | PRN
Start: 2022-08-24 — End: 2022-09-05

## 2022-08-24 NOTE — Telephone Encounter (Signed)
States this is not a patient of theirs and this needs to be sent to another pharmacy for filling hydrocortisone (ANUSOL-HC) 25 MG suppository

## 2022-08-24 NOTE — Patient Instructions (Addendum)
A few things to remember from today's visit:  Hemiparesis affecting left side as late effect of stroke  Essential hypertension  DM (diabetes mellitus), type 2 with peripheral vascular complications  Rectal ulcer - Plan: hydrocortisone (ANUSOL-HC) 25 MG suppository  Will work on completing forms for nursing home. Keep appt with endocrinologist and re-schedule appt with gastroenterologist.  If you need refills for medications you take chronically, please call your pharmacy. Do not use My Chart to request refills or for acute issues that need immediate attention. If you send a my chart message, it may take a few days to be addressed, specially if I am not in the office.  Please be sure medication list is accurate. If a new problem present, please set up appointment sooner than planned today.

## 2022-08-24 NOTE — Telephone Encounter (Signed)
Rx resent.

## 2022-08-25 NOTE — Assessment & Plan Note (Signed)
Problem has not been well controlled. Last HgA1C 11.4 in 05/2022. Her niece reports BS's 150's. No changes in Semglee or Novolog dose. Keep appt with endocrinologist next week.

## 2022-08-25 NOTE — Assessment & Plan Note (Signed)
BP today mildly elevated, BP's at home reported as "good." For now continue Amlodipine 10 mg daily and Hydralazine 50 mg tid and low salt diet. Continue monitoring BP regularly.

## 2022-08-25 NOTE — Assessment & Plan Note (Signed)
Seen on colonoscopy. Symptomatic. According to her niece, Bx results were negative for IBD or malignancy. Her sister is planning on re-scheduling appt with GI. Hydrocortisone suppositories recommended bid prn. Adequate hygiene after defecation, avoid straining.

## 2022-08-25 NOTE — Assessment & Plan Note (Signed)
She has had several CVA with residual dizziness and dysarthria, the last one with left-sided weakness. She needs assistance with ADL's and IADL's,  her sister cannot provide the care she needs at this time. She is planning on NH placement, FL2 will be completed. Continue Brilinta 9 mg bid , Zetia 10 mg daily,and Atorvastatin 80 mg daily. Stressed the importance of adequate BP and glucose controlled.

## 2022-08-26 ENCOUNTER — Emergency Department (HOSPITAL_COMMUNITY)
Admission: EM | Admit: 2022-08-26 | Discharge: 2022-08-27 | Disposition: A | Discharge status: Home / Self Care | Payer: Commercial Managed Care - HMO | Attending: Emergency Medicine | Admitting: Emergency Medicine

## 2022-08-26 ENCOUNTER — Emergency Department (HOSPITAL_COMMUNITY): Payer: Commercial Managed Care - HMO

## 2022-08-26 ENCOUNTER — Telehealth: Payer: Self-pay | Admitting: Gastroenterology

## 2022-08-26 ENCOUNTER — Other Ambulatory Visit: Payer: Self-pay

## 2022-08-26 DIAGNOSIS — I10 Essential (primary) hypertension: Secondary | ICD-10-CM | POA: Insufficient documentation

## 2022-08-26 DIAGNOSIS — E119 Type 2 diabetes mellitus without complications: Secondary | ICD-10-CM | POA: Diagnosis not present

## 2022-08-26 DIAGNOSIS — Z794 Long term (current) use of insulin: Secondary | ICD-10-CM | POA: Diagnosis not present

## 2022-08-26 DIAGNOSIS — Z79899 Other long term (current) drug therapy: Secondary | ICD-10-CM | POA: Diagnosis not present

## 2022-08-26 DIAGNOSIS — N281 Cyst of kidney, acquired: Secondary | ICD-10-CM | POA: Diagnosis not present

## 2022-08-26 DIAGNOSIS — Z7982 Long term (current) use of aspirin: Secondary | ICD-10-CM | POA: Diagnosis not present

## 2022-08-26 DIAGNOSIS — K6289 Other specified diseases of anus and rectum: Secondary | ICD-10-CM | POA: Diagnosis present

## 2022-08-26 LAB — CBC
HCT: 33.8 % — ABNORMAL LOW (ref 36.0–46.0)
Hemoglobin: 11.1 g/dL — ABNORMAL LOW (ref 12.0–15.0)
MCH: 25.9 pg — ABNORMAL LOW (ref 26.0–34.0)
MCHC: 32.8 g/dL (ref 30.0–36.0)
MCV: 78.8 fL — ABNORMAL LOW (ref 80.0–100.0)
Platelets: 257 10*3/uL (ref 150–400)
RBC: 4.29 MIL/uL (ref 3.87–5.11)
RDW: 16.9 % — ABNORMAL HIGH (ref 11.5–15.5)
WBC: 7 10*3/uL (ref 4.0–10.5)
nRBC: 0 % (ref 0.0–0.2)

## 2022-08-26 LAB — BASIC METABOLIC PANEL
Anion gap: 11 (ref 5–15)
BUN: 11 mg/dL (ref 8–23)
CO2: 17 mmol/L — ABNORMAL LOW (ref 22–32)
Calcium: 9.2 mg/dL (ref 8.9–10.3)
Chloride: 110 mmol/L (ref 98–111)
Creatinine, Ser: 0.73 mg/dL (ref 0.44–1.00)
GFR, Estimated: >60 mL/min (ref >60)
Glucose, Bld: 153 mg/dL — ABNORMAL HIGH (ref 70–99)
Potassium: 3.5 mmol/L (ref 3.5–5.1)
Sodium: 138 mmol/L (ref 135–145)

## 2022-08-26 LAB — TROPONIN I (HIGH SENSITIVITY)
Troponin I (High Sensitivity): 15 ng/L (ref <18)
Troponin I (High Sensitivity): 16 ng/L (ref <18)

## 2022-08-26 MED ORDER — IOHEXOL 300 MG/ML  SOLN
100.0000 mL | Freq: Once | INTRAMUSCULAR | Status: AC | PRN
  Administered 2022-08-26: 100 mL via INTRAVENOUS

## 2022-08-26 MED ORDER — ASPIRIN 81 MG PO CHEW
324.0000 mg | CHEWABLE_TABLET | Freq: Once | ORAL | Status: AC
  Administered 2022-08-26: 324 mg via ORAL
  Filled 2022-08-26: qty 4

## 2022-08-26 MED ORDER — LIDOCAINE HCL URETHRAL/MUCOSAL 2 % EX GEL
1.0000 | Freq: Once | CUTANEOUS | Status: AC
  Administered 2022-08-26: 1
  Filled 2022-08-26: qty 11

## 2022-08-26 NOTE — ED Triage Notes (Signed)
Pt BIB [facility] from home. Pt seen last week diagnosed/treated for rectal pain. Pt c/o recurring rectal pain. Hx of strokes. Pt c/o spasms in neck and face from previous stroke. Pt c/o anterior chest wall pain that gets better with palpation.   82HR 99%O2 170/100 140CBG 16RR

## 2022-08-26 NOTE — Telephone Encounter (Signed)
Inbound call from patient requesting a sooner apt. Please advise.

## 2022-08-26 NOTE — Telephone Encounter (Signed)
Spoke with [name], sister of patient. Moved the appointment to 08/31/22.

## 2022-08-26 NOTE — ED Provider Notes (Signed)
EMERGENCY DEPARTMENT AT [facility] Provider Note   CSN: [id number] Arrival date & time: 08/26/22  1902     History {Add pertinent medical, surgical, social history, OB history to HPI:1} Chief Complaint  Patient presents with   Rectal Pain    [name] is a 62 y.o. female.  HPI   The patient has a history of diabetes, hyperlipidemia, hypertension, Guillain-Barr, stroke status post hysterectomy.  Patient presents to the ED with a couple of complaints.  Patient's main issue has been rectal pain.  That has been ongoing for a while.  Patient states she has sharp pain in her rectal area.  She has had some episodes of constipation.  No fevers.  No vomiting.  No abdominal pain.  She does have history of lower GI bleeding.  She was admitted to the hospital back on March 6.  She had a colonoscopy at that time.  Patient was noted to have a rectal ulcer that caused her intestinal bleeding.  Patient had biopsies taken as part of that evaluation.  Patient was supposed to see GI earlier this week but the appointment was canceled because it conflicted with her primary doctor's office visit.  Notes were reviewed from that visit.  Patient was prescribed hydrocortisone suppositories.  She was post to follow-up with her GYN doctor.  Patient was still having symptoms so she asked for sooner appointment and now has 1 on May 1. Patient also has been having some intermittent pain in her chest.  She denies any shortness of breath.  No fevers or chills.  No coughing.  Not currently having any pain.  Patient also complains of a bilateral glovelike sensation in her hands.  She denies any acute headache or weakness  Home Medications Prior to Admission medications   Medication Sig Start Date End Date Taking? Authorizing Provider  acetaminophen (TYLENOL) 325 MG tablet Take 650 mg by mouth every 4 (four) hours as needed for mild pain. Do not exceed 3000mg  in 24 hours    [provider]  AMBULATORY NON FORMULARY MEDICATION Medication Name: Carafate 50 mg suppositories: Insert 1 suppository rectally twice a day for 2 weeks 07/11/22   Armbruster, Willaim Rayas, MD  amLODipine (NORVASC) 10 MG tablet Take 1 tablet by mouth once daily 05/09/22   [place], [name], MD  aspirin EC 81 MG tablet Take 1 tablet (81 mg total) by mouth daily. Swallow whole. 06/18/22   Osvaldo Shipper, MD  atorvastatin (LIPITOR) 80 MG tablet Take 1 tablet (80 mg total) by mouth daily. 05/09/22   [place], [name], MD  B Complex Vitamins (VITAMIN B COMPLEX) TABS Take 1 tablet by mouth every morning.    [provider]  Blood Pressure Monitoring (BLOOD PRESSURE MONITOR AUTOMAT) DEVI 1 Device by Does not apply route daily. 08/29/18   [place], [name], MD  Cholecalciferol (VITAMIN D3 PO) Take 1 capsule by mouth every morning.    [provider]  Dulaglutide (TRULICITY) 0.75 MG/0.5ML SOPN Inject 0.75 mg into the skin once a week. Patient taking differently: Inject 0.75 mg into the skin once a week. Friday 04/13/22   Shamleffer, Konrad Dolores, MD  ezetimibe (ZETIA) 10 MG tablet Take 1 tablet (10 mg total) by mouth daily. 05/09/22   [place], [name], MD  FLUoxetine (PROZAC) 20 MG capsule Take 1 capsule by mouth once daily 11/16/21   [place], [name], MD  glucose blood (CONTOUR NEXT TEST) test strip 1 each by Other route 2 (two)  times daily. And lancets 2/day 03/09/18   Romero Belling, MD  glucose blood [facility] VERIO) test strip USE TO CHECK BLOOD SUGAR TWICE A DAY AND PRN 07/29/15   Gordy Savers, MD  hydrALAZINE (APRESOLINE) 50 MG tablet TAKE 1 TABLET BY MOUTH THREE TIMES DAILY 11/16/21   [place], [name], MD  hydrocortisone (ANUSOL-HC) 25 MG suppository Place 1 suppository (25 mg total) rectally 2 (two) times daily as needed for up to 12 days for hemorrhoids or anal itching. 08/24/22 09/05/22  [place], [name], MD  hydrOXYzine (ATARAX) 50 MG tablet Take 50 mg by mouth 2 (two) times daily.    [provider]   insulin aspart (NOVOLOG) 100 UNIT/ML injection Inject 0-9 Units into the skin 3 (three) times daily with meals. 0-9 Units, Subcutaneous, 3 times daily with meals, First dose on Thu 07/07/22 at 0800 Correction coverage: Sensitive (thin, NPO, renal) CBG < 70: Implement Hypoglycemia Standing Orders and refer to Hypoglycemia Standing Orders sidebar report CBG 70 - 120: 0 units CBG 121 - 150: 1 unit CBG 151 - 200: 2 units CBG 201 - 250: 3 units CBG 251 - 300: 5 units CBG 301 - 350: 7 units CBG 351 - 400: 9 units CBG > 400: call MD. 07/11/22   Elease Etienne, MD  insulin glargine-yfgn (SEMGLEE) 100 UNIT/ML injection Inject 0.05 mLs (5 Units total) into the skin daily. Patient taking differently: Inject 8 Units into the skin daily. 07/12/22   Hongalgi, Maximino Greenland, MD  loperamide (IMODIUM) 2 MG capsule Take 2 capsules (4 mg total) by mouth 3 (three) times daily as needed for diarrhea or loose stools. 06/17/22   Osvaldo Shipper, MD  methocarbamol (ROBAXIN) 500 MG tablet Take 1 tablet (500 mg total) by mouth 2 (two) times daily. 08/21/22   Darrick Grinder, PA-C  omeprazole (PRILOSEC OTC) 20 MG tablet Take 20 mg by mouth daily.    [provider]  ONE TOUCH LANCETS MISC USE TO CHECK BLOOD SUGAR TWICE A DAY AND PRN 07/29/15   Gordy Savers, MD  saccharomyces boulardii (FLORASTOR) 250 MG capsule Take 1 capsule (250 mg total) by mouth 2 (two) times daily. 06/17/22   Osvaldo Shipper, MD  ticagrelor (BRILINTA) 90 MG TABS tablet Take 1 tablet (90 mg total) by mouth 2 (two) times daily. 06/17/22   Osvaldo Shipper, MD  tiZANidine (ZANAFLEX) 2 MG tablet Take 1 tablet by mouth every 8 (eight) hours as needed for muscle spasms.    [provider]  traMADol (ULTRAM) 50 MG tablet Take 25 mg by mouth every 6 (six) hours as needed for moderate pain or severe pain.    [provider]      Allergies    Patient has no known allergies.    Review of Systems   Review of Systems  Physical  Exam Updated Vital Signs BP (!) 165/84   Pulse 81   Temp 97.8 F (36.6 C) (Oral)   Resp 16   SpO2 100%  Physical Exam Vitals and nursing note reviewed.  Constitutional:      Appearance: She is well-developed. She is not diaphoretic.  HENT:     Head: Normocephalic and atraumatic.     Right Ear: External ear normal.     Left Ear: External ear normal.  Eyes:     General: No scleral icterus.       Right eye: No discharge.        Left eye: No discharge.  Conjunctiva/sclera: Conjunctivae normal.  Neck:     Trachea: No tracheal deviation.  Cardiovascular:     Rate and Rhythm: Normal rate and regular rhythm.  Pulmonary:     Effort: Pulmonary effort is normal. No respiratory distress.     Breath sounds: Normal breath sounds. No stridor. No wheezing or rales.  Abdominal:     General: Bowel sounds are normal. There is no distension.     Palpations: Abdomen is soft.     Tenderness: There is no abdominal tenderness. There is no guarding or rebound.  Genitourinary:    Comments: No obvious mass appreciated on rectal exam, no hemorrhoids noted, no fecal impaction, no gross blood noted Musculoskeletal:        General: No tenderness or deformity.     Cervical back: Neck supple.  Skin:    General: Skin is warm and dry.     Findings: No rash.  Neurological:     Cranial Nerves: No cranial nerve deficit, dysarthria or facial asymmetry.     Sensory: No sensory deficit.     Motor: Weakness present. No abnormal muscle tone or seizure activity.     Coordination: Coordination normal.     Comments: Left-sided hemiparesis  Psychiatric:        Mood and Affect: Mood normal.     ED Results / Procedures / Treatments   Labs (all labs ordered are listed, but only abnormal results are displayed) Labs Reviewed - No data to display  EKG EKG Interpretation  Date/Time:  Friday August 26 2022 19:20:22 EDT Ventricular Rate:  84 PR Interval:  132 QRS Duration: 94 QT Interval:  396 QTC  Calculation: 469 R Axis:   -6 Text Interpretation: Sinus rhythm Left ventricular hypertrophy Nonspecific T abnormalities, lateral leads No significant change since last tracing Confirmed by Linwood Dibbles [id number]) on 08/26/2022 7:40:39 PM  Radiology No results found.  Procedures Procedures  {Document cardiac monitor, telemetry assessment procedure when appropriate:1}  Medications Ordered in ED Medications - No data to display  ED Course/ Medical Decision Making/ A&P   {   Click here for ABCD2, HEART and other calculatorsREFRESH Note before signing :1}                          Medical Decision Making  ***  {Document critical care time when appropriate:1} {Document review of labs and clinical decision tools ie heart score, Chads2Vasc2 etc:1}  {Document your independent review of radiology images, and any outside records:1} {Document your discussion with family members, caretakers, and with consultants:1} {Document social determinants of health affecting pt's care:1} {Document your decision making why or why not admission, treatments were needed:1} Final Clinical Impression(s) / ED Diagnoses Final diagnoses:  None    Rx / DC Orders ED Discharge Orders     None

## 2022-08-27 MED ORDER — HYDROCODONE-ACETAMINOPHEN 5-325 MG PO TABS: 1.0000 | ORAL_TABLET | Freq: Four times a day (QID) | ORAL | 0 refills | Status: DC | PRN

## 2022-08-27 MED ORDER — CIPROFLOXACIN HCL 500 MG PO TABS
500.0000 mg | ORAL_TABLET | Freq: Once | ORAL | Status: AC
  Administered 2022-08-27: 500 mg via ORAL
  Filled 2022-08-27: qty 1

## 2022-08-27 MED ORDER — METRONIDAZOLE 500 MG PO TABS
500.0000 mg | ORAL_TABLET | Freq: Once | ORAL | Status: AC
  Administered 2022-08-27: 500 mg via ORAL
  Filled 2022-08-27: qty 1

## 2022-08-27 MED ORDER — CIPROFLOXACIN HCL 500 MG PO TABS: 500.0000 mg | ORAL_TABLET | Freq: Two times a day (BID) | ORAL | 0 refills | Status: DC

## 2022-08-27 MED ORDER — METRONIDAZOLE 500 MG PO TABS: 500.0000 mg | ORAL_TABLET | Freq: Two times a day (BID) | ORAL | 0 refills | Status: DC

## 2022-08-27 NOTE — ED Notes (Signed)
Ptar transport setup for pt.

## 2022-08-27 NOTE — Discharge Instructions (Addendum)
Take the medications as prescribed to help with possible infection causing your rectal pain.  Follow-up with your GI doctor as planned for further evaluation.  The CT scan also showed an incidental cyst in the kidney.  Follow-up with your doctor to arrange an outpatient MRI in the next month or so

## 2022-08-29 NOTE — Progress Notes (Signed)
08/31/2022 [name] [id number] 1960/07/10  Referring provider: [place], [name], MD Primary GI doctor: Dr. 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 most suspicious for sterocoral ulcers, versus anal prolapse, less likely from infection/IBD with pathology. Long discussion with family and patient need to keep stools soft and moving, add on miralax Never got carafate or Canasa suppositories will do 2 weeks of Carafate sent to compound pharmacy, do Canasa suppositories 1 month, follow-up 6 to 8 weeks. Patient is also complaining a lot of rectal spasm will do diltiazem/lidocaine. Close follow-up 6 to 8 weeks.   Patient Care Team: [place], [name], MD as PCP - General (Family Medicine) Glendale Chard, DO as Consulting Physician (Neurology)  HISTORY OF PRESENT ILLNESS: 62 y.o. female with a past medical history of non-Hodgkin's lymphoma, PVD, hypertension, diabetes, hyperlipidemia, multiple strokes on Brillinta, Guillain-Barre syndrome, GERD and others listed below presents for evaluation of hospital follow-up for painless hematochezia.[name] She was in the hospital from 3/6 to 3/11 for painless hematochezia while on Brilinta.  07/08/2022 colonoscopy due to rectal bleeding and anemia on Brilinta shows 3 mm polyp ascending, 3 mm polyp descending, polypoid changes sigmoid colon, tortuous colon, poor anal tone difficult to insufflate left colon/rectum, few ulcers in distal rectum, internal hemorrhoids Pathology showed active proctitis/no evidence of chronicity, granuloma, dysplasia or malignancy.  Negative CMV  Her sister brenda and niece are with her today.  Patient  was suppose to be on Canasa and sucralfate suppositories outpatient but she never had.  She is on hydrocortisone suppository from PCP only, that is not helping. She has rectal pain every 15 mins, feels she has something in her rectum, feels very uncomfortable and then she can have severe rectal pain.  She is having hard Bms can be hard ball and soft, and pain with BM.  She denies any urinary symptoms.  She is suppose to be going to facility, [facility].    She  reports that she has never smoked. She has never used smokeless tobacco. She reports that she does not drink alcohol and does not use drugs.  RELEVANT LABS AND IMAGING: CBC    Component Value Date/Time   WBC 7.0 08/26/2022 2049   RBC 4.29 08/26/2022 2049   HGB 11.1 (L) 08/26/2022 2049   HGB 11.5 (L) 03/11/2022 1248   HCT 33.8 (L) 08/26/2022 2049   PLT 257 08/26/2022 2049   PLT 256 03/11/2022 1248   MCV 78.8 (L) 08/26/2022 2049   MCH 25.9 (L) 08/26/2022 2049   MCHC 32.8 08/26/2022 2049   RDW 16.9 (H) 08/26/2022 2049   LYMPHSABS 2.7 08/21/2022 0703   MONOABS 0.5 08/21/2022 0703   EOSABS 0.3 08/21/2022 0703   BASOSABS 0.4 (H) 08/21/2022 0703   Recent Labs    07/06/22 2050 07/07/22 0126 07/07/22 0830 07/07/22 1414 07/08/22 0129 07/09/22 0645 07/10/22 0113 07/11/22 0051 08/21/22 0703 08/26/22 2049  HGB 11.0* 10.3* 10.3* 10.1* 10.3* 9.7* 9.5* 10.0* 11.1* 11.1*    CMP     Component Value Date/Time   NA 138 08/26/2022 2049   K 3.5 08/26/2022 2049   CL 110 08/26/2022 2049   CO2 17 (L) 08/26/2022 2049   GLUCOSE  153 (H) 08/26/2022 2049   GLUCOSE 129 (H) 04/28/2006 0951   BUN 11 08/26/2022 2049   CREATININE 0.73 08/26/2022 2049   CREATININE 1.47 (H) 03/11/2022 1248   CALCIUM 9.2 08/26/2022 2049   PROT 6.9 08/21/2022 0703   ALBUMIN 3.0 (L) 08/21/2022 0703   AST 32 08/21/2022 0703   AST 20 03/11/2022 1248   ALT 36 08/21/2022 0703   ALT 19 03/11/2022 1248   ALKPHOS 91 08/21/2022 0703   BILITOT 0.6  08/21/2022 0703   BILITOT 0.4 03/11/2022 1248   GFRNONAA >60 08/26/2022 2049   GFRNONAA 40 (L) 03/11/2022 1248   GFRAA >60 01/10/2020 1103      Latest Ref Rng & Units 08/21/2022    7:03 AM 07/07/2022    8:30 AM 07/06/2022    6:36 PM  Hepatic Function  Total Protein 6.5 - 8.1 g/dL 6.9  5.9  6.9   Albumin 3.5 - 5.0 g/dL 3.0  2.4  2.8   AST 15 - 41 U/L 32  21  24   ALT 0 - 44 U/L 36  26  29   Alk Phosphatase 38 - 126 U/L 91  80  89   Total Bilirubin 0.3 - 1.2 mg/dL 0.6  0.6  0.7       Current Medications:   Current Outpatient Medications (Endocrine & Metabolic):    Dulaglutide (TRULICITY) 0.75 MG/0.5ML SOPN, Inject 0.75 mg into the skin once a week. (Patient taking differently: Inject 0.75 mg into the skin once a week. Friday)   insulin aspart (NOVOLOG) 100 UNIT/ML injection, Inject 0-9 Units into the skin 3 (three) times daily with meals. 0-9 Units, Subcutaneous, 3 times daily with meals, First dose on Thu 07/07/22 at 0800 Correction coverage: Sensitive (thin, NPO, renal) CBG < 70: Implement Hypoglycemia Standing Orders and refer to Hypoglycemia Standing Orders sidebar report CBG 70 - 120: 0 units CBG 121 - 150: 1 unit CBG 151 - 200: 2 units CBG 201 - 250: 3 units CBG 251 - 300: 5 units CBG 301 - 350: 7 units CBG 351 - 400: 9 units CBG > 400: call MD.   insulin glargine-yfgn (SEMGLEE) 100 UNIT/ML injection, Inject 0.05 mLs (5 Units total) into the skin daily. (Patient taking differently: Inject 8 Units into the skin daily.)  Current Outpatient Medications (Cardiovascular):    amLODipine (NORVASC) 10 MG tablet, Take 1 tablet by mouth once daily   atorvastatin (LIPITOR) 80 MG tablet, Take 1 tablet (80 mg total) by mouth daily.   ezetimibe (ZETIA) 10 MG tablet, Take 1 tablet (10 mg total) by mouth daily.   hydrALAZINE (APRESOLINE) 50 MG tablet, TAKE 1 TABLET BY MOUTH THREE TIMES DAILY   Current Outpatient Medications (Analgesics):    acetaminophen (TYLENOL) 325 MG tablet, Take 650 mg by mouth  every 4 (four) hours as needed for mild pain. Do not exceed 3000mg  in 24 hours   aspirin EC 81 MG tablet, Take 1 tablet (81 mg total) by mouth daily. Swallow whole.   HYDROcodone-acetaminophen (NORCO/VICODIN) 5-325 MG tablet, Take 1 tablet by mouth every 6 (six) hours as needed.   traMADol (ULTRAM) 50 MG tablet, Take 25 mg by mouth every 6 (six) hours as needed for moderate pain or severe pain.  Current Outpatient Medications (Hematological):    ticagrelor (BRILINTA) 90 MG TABS tablet, Take 1 tablet (90 mg total) by mouth 2 (two) times daily.  Current Outpatient Medications (Other):    AMBULATORY NON FORMULARY MEDICATION, Medication Name: Diltiazem 2%/Lidocaine 2%  Using your index finger apply a small amount of medication inside the anal opening and to the external anal area twice daily x 6 weeks.   B Complex Vitamins (VITAMIN B COMPLEX) TABS, Take 1 tablet by mouth every morning.   Blood Pressure Monitoring (BLOOD PRESSURE MONITOR AUTOMAT) DEVI, 1 Device by Does not apply route daily.   Cholecalciferol (VITAMIN D3 PO), Take 1 capsule by mouth every morning.   ciprofloxacin (CIPRO) 500 MG tablet, Take 1 tablet (500 mg total) by mouth 2 (two) times daily.   FLUoxetine (PROZAC) 20 MG capsule, Take 1 capsule by mouth once daily   glucose blood (CONTOUR NEXT TEST) test strip, 1 each by Other route 2 (two) times daily. And lancets 2/day   glucose blood (ONETOUCH VERIO) test strip, USE TO CHECK BLOOD SUGAR TWICE A DAY AND PRN   hydrocortisone (ANUSOL-HC) 25 MG suppository, Place 1 suppository (25 mg total) rectally 2 (two) times daily as needed for up to 12 days for hemorrhoids or anal itching.   hydrOXYzine (ATARAX) 50 MG tablet, Take 50 mg by mouth 2 (two) times daily.   loperamide (IMODIUM) 2 MG capsule, Take 2 capsules (4 mg total) by mouth 3 (three) times daily as needed for diarrhea or loose stools.   mesalamine (CANASA) 1000 MG suppository, Place 1 suppository (1,000 mg total) rectally at  bedtime.   methocarbamol (ROBAXIN) 500 MG tablet, Take 1 tablet (500 mg total) by mouth 2 (two) times daily.   metroNIDAZOLE (FLAGYL) 500 MG tablet, Take 1 tablet (500 mg total) by mouth 2 (two) times daily.   omeprazole (PRILOSEC OTC) 20 MG tablet, Take 20 mg by mouth daily.   ONE TOUCH LANCETS MISC, USE TO CHECK BLOOD SUGAR TWICE A DAY AND PRN   saccharomyces boulardii (FLORASTOR) 250 MG capsule, Take 1 capsule (250 mg total) by mouth 2 (two) times daily.   tiZANidine (ZANAFLEX) 2 MG tablet, Take 1 tablet by mouth every 8 (eight) hours as needed for muscle spasms.   AMBULATORY NON FORMULARY MEDICATION, Medication Name: Carafate 50 mg suppositories: Insert 1 suppository rectally twice a day for 2 weeks  Medical History:  Past Medical History:  Diagnosis Date   Abnormality of gait 05/10/2010   BACK PAIN 11/14/2008   Class 1 obesity due to excess calories with body mass index (BMI) of 31.0 to 31.9 in adult 02/07/2021   DIABETES MELLITUS, TYPE II 07/15/2008   Diplopia 07/15/2008   ECZEMA, ATOPIC 04/03/2009   GERD (gastroesophageal reflux disease)    Guillain-Barre (HCC) 1988   HYPERLIPIDEMIA 03/06/2009   HYPERTENSION 07/15/2008   Stroke (HCC) 2010, 2011   x2    Vertebral artery stenosis    Allergies: No Known Allergies   Surgical History:  She  has a past surgical history that includes Abdominal hysterectomy; Foot surgery; Dilation and curettage of uterus; IR ANGIO INTRA EXTRACRAN SEL INTERNAL CAROTID UNI R MOD SED (09/17/2021); IR US Guide Vasc Access Right (09/17/2021); IR ANGIO VERTEBRAL SEL VERTEBRAL UNI R MOD SED (09/17/2021); IR ANGIO INTRA EXTRACRAN SEL COM CAROTID INNOMINATE UNI L MOD SED (09/17/2021); Colonoscopy with propofol (N/A, 07/08/2022); and biopsy (07/08/2022). Family History:  Her family history includes Asthma in an other family member; Cancer in her father; Diabetes in her mother and sister; Hypertension in an other family member; Stroke in her mother and another family  member.  REVIEW OF SYSTEMS  : All other systems reviewed and negative except where noted in the History of Present Illness.  PHYSICAL EXAM: BP  128/86   Pulse 100   Ht 5\' 4"  (1.626 m)   Wt 158 lb (71.7 kg)   BMI 27.12 kg/m  General Appearance: Chronically ill-appearing in no apparent distress. Head:   Normocephalic and atraumatic. Eyes:  sclerae anicteric,conjunctive pink  Respiratory: Respiratory effort normal, BS equal bilaterally without rales, rhonchi, wheezing. Cardio: RRR with no MRGs.  Abdomen: Soft,  Obese ,active bowel sounds. No tenderness . [name] No masses. Rectal: declines due to fall risk and getting out of wheelchair Musculoskeletal: Decreased strength left lower legs and arms, in wheelchair, gait not evaluated.   Skin:  Dry and intact without significant lesions or rashes Neuro: Alert and  oriented x4; mild dysarthria, decreased strength left upper and lower extremity. Psych:  Cooperative. Normal mood and flat   Doree Albee, PA-C 1:09 PM

## 2022-08-31 ENCOUNTER — Encounter: Payer: Self-pay | Admitting: Physician Assistant

## 2022-08-31 ENCOUNTER — Ambulatory Visit (INDEPENDENT_AMBULATORY_CARE_PROVIDER_SITE_OTHER): Payer: Commercial Managed Care - HMO | Admitting: Physician Assistant

## 2022-08-31 VITALS — BP 128/86 | HR 100 | Ht 64.0 in | Wt 158.0 lb

## 2022-08-31 DIAGNOSIS — K5904 Chronic idiopathic constipation: Secondary | ICD-10-CM

## 2022-08-31 DIAGNOSIS — K6289 Other specified diseases of anus and rectum: Secondary | ICD-10-CM | POA: Diagnosis not present

## 2022-08-31 MED ORDER — AMBULATORY NON FORMULARY MEDICATION: 0 refills | Status: DC

## 2022-08-31 MED ORDER — AMBULATORY NON FORMULARY MEDICATION
1 refills | Status: DC
Start: 2022-08-31 — End: 2022-10-10

## 2022-08-31 MED ORDER — MESALAMINE 1000 MG RE SUPP: 1000.0000 mg | Freq: Every day | RECTAL | 2 refills | Status: DC

## 2022-08-31 NOTE — Patient Instructions (Addendum)
We have scheduled you a follow up on 10/06/2022 at 9:00am with [name] PA-C   Miralax is an osmotic laxative.  It only brings more water into the stool.  This is safe to take daily.  Can take up to 17 gram of miralax twice a day.  Mix with juice or coffee.  Start 1 capful at night for 3-4 days and reassess your response in 3-4 days.  You can increase and decrease the dose based on your response.  Remember, it can take up to 3-4 days to take effect OR for the effects to wear off.   I often pair this with benefiber in the morning to help assure the stool is not too loose.   [facility]:  The following medication has been prescribed:  Diltiazem/lidocaine and carafate.  This medication MUST be compounded by a compounding pharmacy. Your prescription has been sent to:  [facility] Address: [address] [place], [place] [address]  [phone]  Please DO NOT go directly from our office to pick up this medication! Give the pharmacy 1 day to process the prescription. Extra time is required for them to compound your medication.   Proctitis  Proctitis is inflammation of the lining of the rectum. The rectum is at the end of the large intestine. The inflammation can cause pain and discomfort. It may be short-term and sudden (acute) or a long-term (chronic) problem. What are the causes? Proctitis may be caused by: Sexually transmitted infections (STIs). Infection. Trauma or injury to the rectum or to the opening of the butt (anus). Ulcerative colitis or Crohn's disease. Radiation therapy near the rectum. Antibiotics. What are the signs or symptoms? Symptoms of this condition include: The sudden need to poop. This may be uncomfortable. It may happen often. Pain in the anus or rectum. Bleeding from the rectum. Pain or cramping in the abdomen. Feeling like your rectum is full. Pus or mucus coming from the anus. Diarrhea or lots of soft, loose poop  (stool). Constipation or pain when pooping. How is this diagnosed? This condition may be diagnosed based on your medical history and a physical exam. You may also have tests, such as: A test to check for an STI. Blood tests. Poop tests. A culture test. This is when a sample is taken from your rectum to look for bacteria. Anoscopy. This is a procedure to look at the anal canal. Colonoscopy or sigmoidoscopy. These are procedures to look at the large intestine. How is this treated? Treatment for proctitis depends on the cause. Treatment may include: Medicines to: Treat inflammation. These may be ointments or foams you can put on your rectum. They may also be enemas or medicines you can put in your rectum (suppositories). Treat infection or fight bacteria, such as antibiotics. Fight a virus. These medicines are called antivirals. Help with diarrhea, hard poop, and pain. Reduce the activity of the body's disease-fighting system (immune system). Supplements. Staying away from the activity that caused trauma to the rectum. Heat or laser therapy. This may be used if bleeding does not go away. A procedure to make a narrow rectum wider. Surgery to fix the lining of the rectum. This is rare. If the proctitis was caused by an STI, you may need to get tested again for infection 3 months after treatment. Follow these instructions at home: Medicines Take over-the-counter and prescription medicines only as told by your health care provider. If you were prescribed antibiotics, take them as told by your  provider. Do not stop using the antibiotic even if you start to feel better. Managing constipation Proctitis may cause constipation. To prevent or treat constipation, you may need to: Drink enough fluid to keep your pee (urine) pale yellow. Take over-the-counter or prescription medicines. Eat foods that are high in fiber, such as beans, whole grains, and fresh fruits and vegetables. Limit foods that  are high in fat and processed sugars, such as fried or sweet foods. General instructions  Take sitz baths as told by your provider. A sitz bath is a shallow, warm water bath that you sit down in. The water should only come up to your hips and should cover your butt. Try not to eat right before bed. Keep all follow-up visits. Your provider will need to make sure your condition is improving. Contact a health care provider if: Your symptoms do not get better with treatment. Your symptoms get worse. You have a fever. Get help right away if: You have blood in your poop. There is blood coming from your rectum. This information is not intended to replace advice given to you by your health care provider. Make sure you discuss any questions you have with your health care provider. Document Revised: 12/06/2021 Document Reviewed: 12/06/2021 Elsevier Patient Education  2023 [employer].

## 2022-09-01 ENCOUNTER — Telehealth: Payer: Self-pay | Admitting: Pharmacy Technician

## 2022-09-01 ENCOUNTER — Other Ambulatory Visit (HOSPITAL_COMMUNITY): Payer: Self-pay

## 2022-09-01 ENCOUNTER — Other Ambulatory Visit: Payer: Self-pay

## 2022-09-01 DIAGNOSIS — C8300 Small cell B-cell lymphoma, unspecified site: Secondary | ICD-10-CM

## 2022-09-01 NOTE — Telephone Encounter (Signed)
Patient Advocate Encounter  Received notification from [employer] that prior authorization for MESALAMINE 1000MG  SUPP is required.   PA submitted on 5.2.24 Key [id number] Status is pending

## 2022-09-01 NOTE — Telephone Encounter (Signed)
Pharmacy Patient Advocate Encounter  Received notification from [employer] that the request for prior authorization for MESALAMINE 1000MG  SUPP has been denied due to     Please be advised we currently do not have a Pharmacist to review denials, therefore you will need to process appeals accordingly as needed. Thanks for your support at this time.   You may call [phone] or fax [phone], to appeal.

## 2022-09-02 MED ORDER — MESALAMINE 4 G RE ENEM: 4.0000 g | ENEMA | Freq: Every day | RECTAL | 0 refills | Status: DC

## 2022-09-02 NOTE — Telephone Encounter (Signed)
Called and informed pt of denial for Mesalamine suppositories. Pt is aware that mesalamine enemas were sent to her pharmacy instead. Pt has been advised to call back and let us know if she is not able to retain the enema for as long as she is supposed to. Pt verbalized understanding and had no concerns at the end of the call.

## 2022-09-05 ENCOUNTER — Encounter: Payer: Self-pay | Admitting: Family Medicine

## 2022-09-06 ENCOUNTER — Ambulatory Visit (INDEPENDENT_AMBULATORY_CARE_PROVIDER_SITE_OTHER): Payer: Commercial Managed Care - HMO | Admitting: Internal Medicine

## 2022-09-06 ENCOUNTER — Encounter: Payer: Self-pay | Admitting: Internal Medicine

## 2022-09-06 VITALS — BP 120/80 | HR 77 | Ht 64.0 in

## 2022-09-06 DIAGNOSIS — E1122 Type 2 diabetes mellitus with diabetic chronic kidney disease: Secondary | ICD-10-CM | POA: Diagnosis not present

## 2022-09-06 DIAGNOSIS — Z794 Long term (current) use of insulin: Secondary | ICD-10-CM | POA: Diagnosis not present

## 2022-09-06 DIAGNOSIS — N1832 Chronic kidney disease, stage 3b: Secondary | ICD-10-CM

## 2022-09-06 DIAGNOSIS — E119 Type 2 diabetes mellitus without complications: Secondary | ICD-10-CM | POA: Insufficient documentation

## 2022-09-06 DIAGNOSIS — E1142 Type 2 diabetes mellitus with diabetic polyneuropathy: Secondary | ICD-10-CM | POA: Diagnosis not present

## 2022-09-06 DIAGNOSIS — E1159 Type 2 diabetes mellitus with other circulatory complications: Secondary | ICD-10-CM | POA: Diagnosis not present

## 2022-09-06 LAB — POCT GLYCOSYLATED HEMOGLOBIN (HGB A1C): Hemoglobin A1C: 6.7 % — AB (ref 4.0–5.6)

## 2022-09-06 LAB — POCT GLUCOSE (DEVICE FOR HOME USE): POC Glucose: 143 mg/dl — AB (ref 70–99)

## 2022-09-06 NOTE — Patient Instructions (Addendum)

## 2022-09-06 NOTE — Progress Notes (Signed)
Name: Vanessia Roszkowski  Age/ Sex: 62 y.o., female   MRN/ DOB: [id number], 04-13-1961     PCP: [place], [name], MD   Reason for Endocrinology Evaluation: Type 2 Diabetes Mellitus  Initial Endocrine Consultative Visit: 11/22/2013    PATIENT IDENTIFIER: Ms. Delaney Seele is a 62 y.o. female with a past medical history of DM, HTN and dyslipidemia. The patient has followed with Endocrinology clinic since 11/22/2013 for consultative assistance with management of her diabetes.  DIABETIC HISTORY:  Ms. Miesse was diagnosed with DM 2010.  Insulin started shortly after diagnosis.  Her hemoglobin A1c has ranged from 8.9% in 2021, peaking at 14.0% in 2023.  She was followed by Dr. Everardo All from 2015 until 2022    On her initial visit with me she had an A1c of 14.0%, she was already on basal insulin and was recently started on prandial insulin, I started Trulicity 04/2022  SUBJECTIVE:   During the last visit (04/13/2022): A1c 14.0%  Today (09/06/2022): Ms. Meisner is here for follow-up on diabetes management.  She is accompanied by her nephew today.     Since her last visit here the patient has been to the ED multiple times for CVA  She was also evaluated for lower GI bleed, s/p colonoscopy 07/2022  She was evaluated by GI 08/31/2022 for proctitis  She currently resides at [facility] re[facility] , where her glucose is being checked multiple times a day  Denies nausea, vomiting She denies constipation  or diarrhea     HOME DIABETES REGIMEN:  Lantus 30 units daily - takes 5 units  Humalog 8 units TIDQAC- not using , see below Trulicity 0.75 Mg weekly- not taking   Humalog per facility: 0-120 = 0 121- 150=1 151-200= 2  Statin: Yes ACE-I/ARB: Yes   METER DOWNLOAD SUMMARY:   DIABETIC COMPLICATIONS: Microvascular complications:  Neuropathy Denies:  Last Eye Exam: Completed   Macrovascular complications:  CVA, PVD Denies: CAD   HISTORY:  Past Medical History:   Past Medical History:  Diagnosis Date   Abnormality of gait 05/10/2010   BACK PAIN 11/14/2008   Class 1 obesity due to excess calories with body mass index (BMI) of 31.0 to 31.9 in adult 02/07/2021   DIABETES MELLITUS, TYPE II 07/15/2008   Diplopia 07/15/2008   ECZEMA, ATOPIC 04/03/2009   GERD (gastroesophageal reflux disease)    Guillain-Barre (HCC) 1988   HYPERLIPIDEMIA 03/06/2009   HYPERTENSION 07/15/2008   Stroke (HCC) 2010, 2011   x2    Vertebral artery stenosis    Past Surgical History:  Past Surgical History:  Procedure Laterality Date   ABDOMINAL HYSTERECTOMY     BIOPSY  07/08/2022   Procedure: BIOPSY;  Surgeon: Benancio Deeds, MD;  Location: [facility];  Service: Gastroenterology;;   COLONOSCOPY WITH PROPOFOL N/A 07/08/2022   Procedure: COLONOSCOPY WITH PROPOFOL;  Surgeon: Benancio Deeds, MD;  Location: [facility];  Service: Gastroenterology;  Laterality: N/A;   DILATION AND CURETTAGE OF UTERUS     FOOT SURGERY     IR ANGIO INTRA EXTRACRAN SEL COM CAROTID INNOMINATE UNI L MOD SED  09/17/2021   IR ANGIO INTRA EXTRACRAN SEL INTERNAL CAROTID UNI R MOD SED  09/17/2021   IR ANGIO VERTEBRAL SEL VERTEBRAL UNI R MOD SED  09/17/2021   IR US GUIDE VASC ACCESS RIGHT  09/17/2021   Social History:  reports that she has never smoked. She has never used smokeless tobacco. She reports that she does not drink alcohol and  does not use drugs. Family History:  Family History  Problem Relation Age of Onset   Diabetes Sister    Asthma Other    Stroke Other    Hypertension Other    Diabetes Mother    Stroke Mother    Cancer Father        pt states hae had some kind of stomach cancer, ? stomach or colon   Breast cancer Neg Hx      HOME MEDICATIONS: Allergies as of 09/06/2022   No Known Allergies      Medication List        Accurate as of Sep 06, 2022 11:24 AM. If you have any questions, ask your nurse or doctor.          acetaminophen 325 MG tablet Commonly  known as: TYLENOL Take 650 mg by mouth every 4 (four) hours as needed for mild pain. Do not exceed 3000mg  in 24 hours   AMBULATORY NON FORMULARY MEDICATION Medication Name: Carafate 50 mg suppositories: Insert 1 suppository rectally twice a day for 2 weeks   AMBULATORY NON FORMULARY MEDICATION Medication Name: Diltiazem 2%/Lidocaine 2%   Using your index finger apply a small amount of medication inside the anal opening and to the external anal area twice daily x 6 weeks.   amLODipine 10 MG tablet Commonly known as: NORVASC Take 1 tablet by mouth once daily   aspirin EC 81 MG tablet Take 1 tablet (81 mg total) by mouth daily. Swallow whole.   atorvastatin 80 MG tablet Commonly known as: Lipitor Take 1 tablet (80 mg total) by mouth daily.   Blood Pressure Monitor Automat Devi 1 Device by Does not apply route daily.   ciprofloxacin 500 MG tablet Commonly known as: Cipro Take 1 tablet (500 mg total) by mouth 2 (two) times daily.   ezetimibe 10 MG tablet Commonly known as: ZETIA Take 1 tablet (10 mg total) by mouth daily.   FLUoxetine 20 MG capsule Commonly known as: PROZAC Take 1 capsule by mouth once daily   glucose blood test strip Commonly known as: OneTouch Verio USE TO CHECK BLOOD SUGAR TWICE A DAY AND PRN   glucose blood test strip Commonly known as: Contour Next Test 1 each by Other route 2 (two) times daily. And lancets 2/day   hydrALAZINE 50 MG tablet Commonly known as: APRESOLINE TAKE 1 TABLET BY MOUTH THREE TIMES DAILY   HYDROcodone-acetaminophen 5-325 MG tablet Commonly known as: NORCO/VICODIN Take 1 tablet by mouth every 6 (six) hours as needed.   hydrOXYzine 50 MG tablet Commonly known as: ATARAX Take 50 mg by mouth 2 (two) times daily.   insulin aspart 100 UNIT/ML injection Commonly known as: novoLOG Inject 0-9 Units into the skin 3 (three) times daily with meals. 0-9 Units, Subcutaneous, 3 times daily with meals, First dose on Thu 07/07/22 at 0800  Correction coverage: Sensitive (thin, NPO, renal) CBG < 70: Implement Hypoglycemia Standing Orders and refer to Hypoglycemia Standing Orders sidebar report CBG 70 - 120: 0 units CBG 121 - 150: 1 unit CBG 151 - 200: 2 units CBG 201 - 250: 3 units CBG 251 - 300: 5 units CBG 301 - 350: 7 units CBG 351 - 400: 9 units CBG > 400: call MD.   insulin glargine-yfgn 100 UNIT/ML injection Commonly known as: SEMGLEE Inject 0.05 mLs (5 Units total) into the skin daily.   loperamide 2 MG capsule Commonly known as: IMODIUM Take 2 capsules (4 mg total) by mouth 3 (three)  times daily as needed for diarrhea or loose stools.   mesalamine 4 g enema Commonly known as: ROWASA Place 60 mLs (4 g total) rectally at bedtime. Lay on left side, retain as long as able   methocarbamol 500 MG tablet Commonly known as: ROBAXIN Take 1 tablet (500 mg total) by mouth 2 (two) times daily.   metroNIDAZOLE 500 MG tablet Commonly known as: FLAGYL Take 1 tablet (500 mg total) by mouth 2 (two) times daily.   omeprazole 20 MG tablet Commonly known as: PRILOSEC OTC Take 20 mg by mouth daily.   ONE TOUCH LANCETS Misc USE TO CHECK BLOOD SUGAR TWICE A DAY AND PRN   saccharomyces boulardii 250 MG capsule Commonly known as: FLORASTOR Take 1 capsule (250 mg total) by mouth 2 (two) times daily.   ticagrelor 90 MG Tabs tablet Commonly known as: BRILINTA Take 1 tablet (90 mg total) by mouth 2 (two) times daily.   tiZANidine 2 MG tablet Commonly known as: ZANAFLEX Take 1 tablet by mouth every 8 (eight) hours as needed for muscle spasms.   traMADol 50 MG tablet Commonly known as: ULTRAM Take 25 mg by mouth every 6 (six) hours as needed for moderate pain or severe pain.   Trulicity 0.75 MG/0.5ML Sopn Generic drug: Dulaglutide Inject 0.75 mg into the skin once a week.   Vitamin B Complex Tabs Take 1 tablet by mouth every morning.   VITAMIN D3 PO Take 1 capsule by mouth every morning.         OBJECTIVE:   Vital  Signs: BP 120/80 (BP Location: Left Arm, Patient Position: Sitting, Cuff Size: Small)   Pulse 77   Ht 5\' 4"  (1.626 m)   SpO2 99%   BMI 27.12 kg/m   Wt Readings from Last 3 Encounters:  08/31/22 158 lb (71.7 kg)  08/24/22 158 lb (71.7 kg)  08/21/22 158 lb (71.7 kg)     Exam: General: Pt appears well and is in NAD  Neck: General: Supple without adenopathy. Thyroid: Thyroid size normal.  No goiter or nodules appreciated.   Lungs: Clear with good BS bilat   Heart: RRR   Abdomen:  soft, nontender  Extremities: No pretibial edema.   Neuro: MS is good with appropriate affect, pt is alert and Ox3   Dm Foot Exam 09/06/2022  The skin of the feet is intact without sores or ulcerations. The pedal pulses are undetectable  The sensation is decreased to a screening 5.07, 10 gram monofilament bilaterally    DATA REVIEWED:  Lab Results  Component Value Date   HGBA1C 11.4 (H) 05/30/2022   HGBA1C >14.0 (H) 03/16/2022   HGBA1C 12.9 (H) 09/16/2021    Latest Reference Range & Units 08/26/22 20:49  Sodium 135 - 145 mmol/L 138  Potassium 3.5 - 5.1 mmol/L 3.5  Chloride 98 - 111 mmol/L 110  CO2 22 - 32 mmol/L 17 (L)  Glucose 70 - 99 mg/dL 161 (H)  BUN 8 - 23 mg/dL 11  Creatinine 0.96 - 0.45 mg/dL 4.09  Calcium 8.9 - 81.1 mg/dL 9.2  Anion gap 5 - 15  11  GFR, Estimated >60 mL/min >60  (L): Data is abnormally low (H): Data is abnormally high   Old records , labs and images have been reviewed.    ASSESSMENT / PLAN / RECOMMENDATIONS:   1) Type 2 Diabetes Mellitus, Optimally controlled, With neuropathic, CKD III  and macrovascular complications - Most recent A1c of 6.7%. Goal A1c < 7.0 %.   -  Her an A1c has trended down to optimal levels since she has been residing at the rehab center, which is indication that historically she has not been taking her insulin nor has she been following a low-carb diet -6 months ago A1c was over 14.0%, today it is 6.7%, requiring minimal amount of  insulin -No changes to insulin regimen no correction scale from the facility at this time -I did ask the facility to send glucose logs in the future   MEDICATIONS: Continue Lantus 5 units daily Continue Humalog before each meal per correction scale Humalog per facility: 0-120 = 0 121- 150=1 151-200= 2   EDUCATION / INSTRUCTIONS: BG monitoring instructions: Patient is instructed to check her blood sugars 3 times a day, before meals . Call [place] [facility] if: BG persistently < 70  I reviewed the Rule of 15 for the treatment of hypoglycemia in detail with the patient. Literature supplied.    2) Diabetic complications:  Eye: Does not have known diabetic retinopathy.  Neuro/ Feet: Does  have known diabetic peripheral neuropathy .  Renal: Patient does  have known baseline CKD. She   is not on an ACEI/ARB at present.    F/U in 6 months     I spent 25 minutes preparing to see the patient by review of recent labs, imaging and procedures, obtaining and reviewing separately obtained history, communicating with the patient/family or caregiver, ordering medications, tests or procedures, and documenting clinical information in the EHR including the differential Dx, treatment, and any further evaluation and other management    Signed electronically by: Lyndle Herrlich, MD  [facility] Endocrinology  [facility] [address] [place] [place], [place] [address] Phone: [phone] FAX: [phone]   CC: [place], [name], MD [address] [place] [address] Phone: [phone]  Fax: [phone]  Return to Endocrinology clinic as below: Future Appointments  Date Time Provider Department Center  09/06/2022 11:50 AM Ricarda Atayde, Konrad Dolores, MD [facility] None  09/12/2022 11:30 AM [facility] None  09/12/2022 12:00 PM Johney Maine, MD [facility] None  10/24/2022 10:50 AM Nita Sickle K, DO [facility] None   11/07/2022  9:00 AM Doree Albee, PA-C [facility]

## 2022-09-12 ENCOUNTER — Inpatient Hospital Stay: Payer: Commercial Managed Care - HMO | Attending: Hematology

## 2022-09-12 ENCOUNTER — Inpatient Hospital Stay (HOSPITAL_BASED_OUTPATIENT_CLINIC_OR_DEPARTMENT_OTHER): Payer: Commercial Managed Care - HMO | Admitting: Hematology

## 2022-09-12 ENCOUNTER — Other Ambulatory Visit: Payer: Self-pay

## 2022-09-12 VITALS — BP 136/88 | HR 93 | Temp 97.2°F | Resp 17 | Ht 64.0 in

## 2022-09-12 DIAGNOSIS — R718 Other abnormality of red blood cells: Secondary | ICD-10-CM | POA: Diagnosis not present

## 2022-09-12 DIAGNOSIS — C8300 Small cell B-cell lymphoma, unspecified site: Secondary | ICD-10-CM | POA: Diagnosis not present

## 2022-09-12 LAB — CBC WITH DIFFERENTIAL (CANCER CENTER ONLY)
Abs Immature Granulocytes: 0.02 10*3/uL (ref 0.00–0.07)
Basophils Absolute: 0 10*3/uL (ref 0.0–0.1)
Basophils Relative: 1 %
Eosinophils Absolute: 0.2 10*3/uL (ref 0.0–0.5)
Eosinophils Relative: 3 %
HCT: 34.2 % — ABNORMAL LOW (ref 36.0–46.0)
Hemoglobin: 11.3 g/dL — ABNORMAL LOW (ref 12.0–15.0)
Immature Granulocytes: 0 %
Lymphocytes Relative: 38 %
Lymphs Abs: 2.7 10*3/uL (ref 0.7–4.0)
MCH: 25.7 pg — ABNORMAL LOW (ref 26.0–34.0)
MCHC: 33 g/dL (ref 30.0–36.0)
MCV: 77.9 fL — ABNORMAL LOW (ref 80.0–100.0)
Monocytes Absolute: 0.9 10*3/uL (ref 0.1–1.0)
Monocytes Relative: 13 %
Neutro Abs: 3.2 10*3/uL (ref 1.7–7.7)
Neutrophils Relative %: 45 %
Platelet Count: 329 10*3/uL (ref 150–400)
RBC: 4.39 MIL/uL (ref 3.87–5.11)
RDW: 16.1 % — ABNORMAL HIGH (ref 11.5–15.5)
WBC Count: 7.1 10*3/uL (ref 4.0–10.5)
nRBC: 0 % (ref 0.0–0.2)

## 2022-09-12 LAB — CMP (CANCER CENTER ONLY)
ALT: 46 U/L — ABNORMAL HIGH (ref 0–44)
AST: 31 U/L (ref 15–41)
Albumin: 3.6 g/dL (ref 3.5–5.0)
Alkaline Phosphatase: 102 U/L (ref 38–126)
Anion gap: 10 (ref 5–15)
BUN: 14 mg/dL (ref 8–23)
CO2: 21 mmol/L — ABNORMAL LOW (ref 22–32)
Calcium: 9.6 mg/dL (ref 8.9–10.3)
Chloride: 107 mmol/L (ref 98–111)
Creatinine: 0.8 mg/dL (ref 0.44–1.00)
GFR, Estimated: >60 mL/min (ref >60)
Glucose, Bld: 170 mg/dL — ABNORMAL HIGH (ref 70–99)
Potassium: 3.5 mmol/L (ref 3.5–5.1)
Sodium: 138 mmol/L (ref 135–145)
Total Bilirubin: 0.4 mg/dL (ref 0.3–1.2)
Total Protein: 7.2 g/dL (ref 6.5–8.1)

## 2022-09-12 LAB — LACTATE DEHYDROGENASE: LDH: 184 U/L (ref 98–192)

## 2022-09-12 LAB — IRON AND IRON BINDING CAPACITY (CC-WL,HP ONLY)
Iron: 57 ug/dL (ref 28–170)
Saturation Ratios: 23 % (ref 10.4–31.8)
TIBC: 245 ug/dL — ABNORMAL LOW (ref 250–450)
UIBC: 188 ug/dL (ref 148–442)

## 2022-09-12 LAB — FERRITIN: Ferritin: 386 ng/mL — ABNORMAL HIGH (ref 11–307)

## 2022-09-12 NOTE — Progress Notes (Signed)
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 The pt reports that she is doing well overall.   The pt reports that her axillary lymphadenopathy was picked up during a routing Mammogram on 05/31/2019. Pt denies feeling any lumps or bumps in either axillary regions. Pt has felt the same for the last 6-12 months.   She has HTN and Diabetes. Her HTN is currently well-controlled but her Diabetes has been harder to control. Pt notes that "sometimes it's up and sometimes it's down". 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 She feels that the reaction on her left side is a little slower than her right side, but nothing that's noticeable. She denies any weakness, vision changes, or other residual symptoms from either stroke. 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 system for 5 months. Pt made a full recovery and has no remaining symptoms. Her eczema is seasonal and not very bothersome. She denies any concerns for frequent infections. Pt has neuropathy in her hands/fingers and has been prescribed Gabapentin, which is helping.   Pt does not drink much alcohol and has never been a smoker. Her father passed from Lung Cancer and had a history of smoking. Her mother passed from a stroke last spring. 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 She is living alone and has one sister in the area.   Pt has had her first dose of the  COVID19 vaccine and tolerated it well. She has the second dose scheduled.   Of note prior to the patient's visit today, pt has had Flow Pathology ([id number]) completed on 06/26/2019 with results revealing "Monoclonal B-cell population identified."  Pt has had Right Axilla LN Bx ([id number]) completed on 06/26/2019 with results revealing "NON-HODGKIN B-CELL LYMPHOMA"   Pt has also had Mammogram [phone]) completed on 05/31/2019 with results revealing "Further evaluation is suggested for prominent lymph nodes in the bilateral axilla."  Most recent lab results (03/13/2019) of CBC and CMP is as follows: all values are WNL except for RBC at 5.61, MCV at 78.8, MCH at 25.8, Sodium at 131, Glucose at 502, BUN at 24.  On review of systems, pt reports hard stools, tingling/numbness in fingers/hands and denies weakness, vision changes, fevers, chills, night sweats, SOB, fatigue, constipation, diarrhea and any other symptoms.   On PMHx the pt reports Guillain-Barr syndrome, Type II Diabetes, Atopic Eczema, HLD, HTN, Stroke x2, Neuropathy. On Social Hx the pt reports that she does not drink much alcohol and is a non-smoker.  INTERVAL HISTORY:  [name] is a wonderful 62 y.o. female who is here for continued evaluation and management of her small lymphocytic lymphoma.  Patient was last seen by me on 03/11/2022 and she was doing well overall.   Patient is accompanied by two of her family members during this visit. Patient reports she has not been doing well since our last visit. She notes she had a stroke in January2024. She complains of enlarged lymph nodes near  her left armpit which are painful and uncomfortable. She is unable to move her left hand due to left armpit pain. She also complains of left neck pain which feels like it is "squeezing her throat." However, she denies swallowing problem. Patient notes that her breathing and her SPO2 levels have been stable.  She denies fever, chills,  night sweats, unexpected weight loss, abdominal pain, chest pain, back pain, skin rashes, shortness of breath, or leg swelling. She does complain of pain with bowel movement, occasional constipation, and occasional dizziness. She also had colonoscopy since our last visit. She notes she takes stool softener for constipation.   She notes she has been eating well, staying well hydrated, and her weight has been stable. She is currently in PT after her stroke.    MEDICAL HISTORY:  Past Medical History:  Diagnosis Date   Abnormality of gait 05/10/2010   BACK PAIN 11/14/2008   Class 1 obesity due to excess calories with body mass index (BMI) of 31.0 to 31.9 in adult 02/07/2021   DIABETES MELLITUS, TYPE II 07/15/2008   Diplopia 07/15/2008   ECZEMA, ATOPIC 04/03/2009   GERD (gastroesophageal reflux disease)    Guillain-Barre (HCC) 1988   HYPERLIPIDEMIA 03/06/2009   HYPERTENSION 07/15/2008   Stroke (HCC) 2010, 2011   x2    Vertebral artery stenosis     SURGICAL HISTORY: Past Surgical History:  Procedure Laterality Date   ABDOMINAL HYSTERECTOMY     BIOPSY  07/08/2022   Procedure: BIOPSY;  Surgeon: Benancio Deeds, MD;  Location: [facility];  Service: Gastroenterology;;   COLONOSCOPY WITH PROPOFOL N/A 07/08/2022   Procedure: COLONOSCOPY WITH PROPOFOL;  Surgeon: Benancio Deeds, MD;  Location: [facility];  Service: Gastroenterology;  Laterality: N/A;   DILATION AND CURETTAGE OF UTERUS     FOOT SURGERY     IR ANGIO INTRA EXTRACRAN SEL COM CAROTID INNOMINATE UNI L MOD SED  09/17/2021   IR ANGIO INTRA EXTRACRAN SEL INTERNAL CAROTID UNI R MOD SED  09/17/2021   IR ANGIO VERTEBRAL SEL VERTEBRAL UNI R MOD SED  09/17/2021   IR US GUIDE VASC ACCESS RIGHT  09/17/2021    SOCIAL HISTORY: Social History   Socioeconomic History   Marital status: Divorced    Spouse name: Not on file   Number of children: 0   Years of education: 13   Highest education level: Not on file  Occupational History    Occupation: custodian at [employer]  Tobacco Use   Smoking status: Never   Smokeless tobacco: Never  Vaping Use   Vaping Use: Never used  Substance and Sexual Activity   Alcohol use: No   Drug use: No   Sexual activity: Not Currently  Other Topics Concern   Not on file  Social History Narrative   Right Handed    Lives in a one story home    No caffeine   Social Determinants of Health   Financial Resource Strain: Not on file  Food Insecurity: No Food Insecurity (05/29/2022)   Hunger Vital Sign    Worried About Running Out of Food in the Last Year: Never true    Ran Out of Food in the Last Year: Never true  Transportation Needs: No Transportation Needs (05/29/2022)   PRAPARE - Administrator, Civil Service (Medical): No    Lack of Transportation (Non-Medical): No  Physical Activity: Not on file  Stress: Not on file  Social Connections: Not on file  Intimate Partner Violence:  Not At Risk (05/29/2022)   Humiliation, Afraid, Rape, and Kick questionnaire    Fear of Current or Ex-Partner: No    Emotionally Abused: No    Physically Abused: No    Sexually Abused: No    FAMILY HISTORY: Family History  Problem Relation Age of Onset   Diabetes Sister    Asthma Other    Stroke Other    Hypertension Other    Diabetes Mother    Stroke Mother    Cancer Father        pt states hae had some kind of stomach cancer, ? stomach or colon   Breast cancer Neg Hx     ALLERGIES:  has No Known Allergies.  MEDICATIONS:  Current Outpatient Medications  Medication Sig Dispense Refill   acetaminophen (TYLENOL) 325 MG tablet Take 650 mg by mouth every 4 (four) hours as needed for mild pain. Do not exceed 3000mg  in 24 hours     AMBULATORY NON FORMULARY MEDICATION Medication Name: Carafate 50 mg suppositories: Insert 1 suppository rectally twice a day for 2 weeks 28 suppository 0   AMBULATORY NON FORMULARY MEDICATION Medication Name: Diltiazem 2%/Lidocaine 2%   Using  your index finger apply a small amount of medication inside the anal opening and to the external anal area twice daily x 6 weeks. 30 g 1   amLODipine (NORVASC) 10 MG tablet Take 1 tablet by mouth once daily 90 tablet 1   aspirin EC 81 MG tablet Take 1 tablet (81 mg total) by mouth daily. Swallow whole. 30 tablet 12   atorvastatin (LIPITOR) 80 MG tablet Take 1 tablet (80 mg total) by mouth daily. 30 tablet 3   B Complex Vitamins (VITAMIN B COMPLEX) TABS Take 1 tablet by mouth every morning.     Blood Pressure Monitoring (BLOOD PRESSURE MONITOR AUTOMAT) DEVI 1 Device by Does not apply route daily. 1 Device 0   Cholecalciferol (VITAMIN D3 PO) Take 1 capsule by mouth every morning.     ciprofloxacin (CIPRO) 500 MG tablet Take 1 tablet (500 mg total) by mouth 2 (two) times daily. 14 tablet 0   Dulaglutide (TRULICITY) 0.75 MG/0.5ML SOPN Inject 0.75 mg into the skin once a week. (Patient not taking: Reported on 09/06/2022) 6 mL 3   ezetimibe (ZETIA) 10 MG tablet Take 1 tablet (10 mg total) by mouth daily. 30 tablet 3   FLUoxetine (PROZAC) 20 MG capsule Take 1 capsule by mouth once daily 90 capsule 2   glucose blood (CONTOUR NEXT TEST) test strip 1 each by Other route 2 (two) times daily. And lancets 2/day 200 each 3   glucose blood (ONETOUCH VERIO) test strip USE TO CHECK BLOOD SUGAR TWICE A DAY AND PRN 100 each 6   hydrALAZINE (APRESOLINE) 50 MG tablet TAKE 1 TABLET BY MOUTH THREE TIMES DAILY 90 tablet 2   HYDROcodone-acetaminophen (NORCO/VICODIN) 5-325 MG tablet Take 1 tablet by mouth every 6 (six) hours as needed. 12 tablet 0   hydrOXYzine (ATARAX) 50 MG tablet Take 50 mg by mouth 2 (two) times daily.     insulin aspart (NOVOLOG) 100 UNIT/ML injection Inject 0-9 Units into the skin 3 (three) times daily with meals. 0-9 Units, Subcutaneous, 3 times daily with meals, First dose on Thu 07/07/22 at 0800 Correction coverage: Sensitive (thin, NPO, renal) CBG < 70: Implement Hypoglycemia Standing Orders and refer  to Hypoglycemia Standing Orders sidebar report CBG 70 - 120: 0 units CBG 121 - 150: 1 unit CBG 151 - 200: 2  units CBG 201 - 250: 3 units CBG 251 - 300: 5 units CBG 301 - 350: 7 units CBG 351 - 400: 9 units CBG > 400: call MD.     insulin glargine-yfgn (SEMGLEE) 100 UNIT/ML injection Inject 0.05 mLs (5 Units total) into the skin daily.     loperamide (IMODIUM) 2 MG capsule Take 2 capsules (4 mg total) by mouth 3 (three) times daily as needed for diarrhea or loose stools. 30 capsule 0   mesalamine (ROWASA) 4 g enema Place 60 mLs (4 g total) rectally at bedtime. Lay on left side, retain as long as able 1800 mL 0   methocarbamol (ROBAXIN) 500 MG tablet Take 1 tablet (500 mg total) by mouth 2 (two) times daily. 20 tablet 0   metroNIDAZOLE (FLAGYL) 500 MG tablet Take 1 tablet (500 mg total) by mouth 2 (two) times daily. 14 tablet 0   omeprazole (PRILOSEC OTC) 20 MG tablet Take 20 mg by mouth daily.     ONE TOUCH LANCETS MISC USE TO CHECK BLOOD SUGAR TWICE A DAY AND PRN 100 each 6   saccharomyces boulardii (FLORASTOR) 250 MG capsule Take 1 capsule (250 mg total) by mouth 2 (two) times daily.     ticagrelor (BRILINTA) 90 MG TABS tablet Take 1 tablet (90 mg total) by mouth 2 (two) times daily. 60 tablet    tiZANidine (ZANAFLEX) 2 MG tablet Take 1 tablet by mouth every 8 (eight) hours as needed for muscle spasms.     traMADol (ULTRAM) 50 MG tablet Take 25 mg by mouth every 6 (six) hours as needed for moderate pain or severe pain.     No current facility-administered medications for this visit.    REVIEW OF SYSTEMS:   10 Point review of Systems was done is negative except as noted above.  PHYSICAL EXAMINATION: ECOG PERFORMANCE STATUS: 0 - Asymptomatic  . Vitals:   09/12/22 1235  BP: 136/88  Pulse: 93  Resp: 17  Temp: (!) 97.2 F (36.2 C)  SpO2: 100%    Filed Weights    .Body mass index is 27.12 kg/m.    [name] GENERAL:alert, in no acute distress and comfortable SKIN: no acute rashes, no  significant lesions EYES: conjunctiva are pink and non-injected, sclera anicteric OROPHARYNX: MMM, no exudates, no oropharyngeal erythema or ulceration NECK: supple, no JVD LYMPH: small cervical and axillary LN,  no palpable lymphadenopathy in the inguinal regions LUNGS: clear to auscultation b/l with normal respiratory effort HEART: regular rate & rhythm ABDOMEN:  normoactive bowel sounds , non tender, not distended. Extremity: no pedal edema   LABORATORY DATA:  I have reviewed the data as listed  .    Latest Ref Rng & Units 09/12/2022   11:34 AM 08/26/2022    8:49 PM 08/21/2022    7:03 AM  CBC  WBC 4.0 - 10.5 K/uL 7.1  7.0  7.4   Hemoglobin 12.0 - 15.0 g/dL 16.1  09.6  04.5   Hematocrit 36.0 - 46.0 % 34.2  33.8  34.5   Platelets 150 - 400 K/uL 329  257  305     .    Latest Ref Rng & Units 09/12/2022   11:34 AM 08/26/2022    8:49 PM 08/24/2022   12:31 PM  CMP  Glucose 70 - 99 mg/dL 409  811    BUN 8 - 23 mg/dL 14  11    Creatinine 9.14 - 1.00 mg/dL 7.82  9.56    Sodium 213 - 145  mmol/L 138  138    Potassium 3.5 - 5.1 mmol/L 3.5  3.5  4.0   Chloride 98 - 111 mmol/L 107  110    CO2 22 - 32 mmol/L 21  17    Calcium 8.9 - 10.3 mg/dL 9.6  9.2    Total Protein 6.5 - 8.1 g/dL 7.2     Total Bilirubin 0.3 - 1.2 mg/dL 0.4     Alkaline Phos 38 - 126 U/L 102     AST 15 - 41 U/L 31     ALT 0 - 44 U/L 46      . Lab Results  Component Value Date   LDH 184 09/12/2022   Alpha-Thalassemia Comment:  Comment: (NOTE) Test: Alpha-Thalassemia, DNA Analysis Result:     Alpha-+-thalassemia trait, alpha alpha/alpha- Mutation(s) identified: alpha3.7    07/16/2019 FISH-CLL Prognostic Panel:         RADIOGRAPHIC STUDIES: I have personally reviewed the radiological images as listed and agreed with the findings in the report. CT ABDOMEN PELVIS W CONTRAST  Result Date: 08/26/2022 CLINICAL DATA:  Left lower quadrant and rectal pain EXAM: CT ABDOMEN AND PELVIS WITH CONTRAST  TECHNIQUE: Multidetector CT imaging of the abdomen and pelvis was performed using the standard protocol following bolus administration of intravenous contrast. RADIATION DOSE REDUCTION: This exam was performed according to the departmental dose-optimization program which includes automated exposure control, adjustment of the mA and/or kV according to patient size and/or use of iterative reconstruction technique. CONTRAST:  OMNIPAQUE IOHEXOL 300 MG/ML  SOLN COMPARISON:  CT 05/29/2022 FINDINGS: Lower chest: Lung bases demonstrate no acute airspace disease. Atelectasis at the lingula. Trace pericardial effusion. Hepatobiliary: No focal liver abnormality is seen. No gallstones, gallbladder wall thickening, or biliary dilatation. Pancreas: Unremarkable. No pancreatic ductal dilatation or surrounding inflammatory changes. Spleen: Normal in size without focal abnormality. Adrenals/Urinary Tract: Adrenal glands are normal. Kidneys show no hydronephrosis. 4.6 cm complex cyst lower pole left kidney with suspected thin septations, slightly increased in size compared to 2021. The bladder is unremarkable Stomach/Bowel: The stomach is nonenlarged. No dilated small bowel. No acute bowel wall thickening. Negative appendix. Vascular/Lymphatic: Moderate aortic atherosclerosis. No aneurysm. Slight increased right cardio phrenic lymph node measuring 9 mm. Multiple enlarged retroperitoneal, mesenteric, pelvic and inguinal nodes. Right common iliac dominant node measures 3.3 cm compared with 2.9 cm previously. Right pelvic lymph node measures 3.2 cm compared with 1.4 cm previously. Left pelvic lymph node measures 2.7 cm, previously 1.7 cm. Reproductive: Hysterectomy.  No adnexal mass Other: No free air. Presacral edema, stranding and small volume fluid. Possible mild circumferential wall thickening at the anus. Musculoskeletal: Heterogeneous mineralization. Findings questionable for nondisplaced sacrococcygeal fracture on sagittal  series 9, image 92. IMPRESSION: 1. No CT evidence for acute intra-abdominal or pelvic abnormality. 2. Multiple enlarged retroperitoneal, mesenteric, pelvic and inguinal lymph nodes, increased compared to prior exam from January 2024 and consistent with history of leukemia/lymphoma. 3. Presacral edema, stranding and small volume fluid. Possible mild circumferential wall thickening at the anus as may be seen with proctitis. 4. Findings questionable for nondisplaced sacrococcygeal fracture, this could also contribute to presacral soft tissue stranding and edema. 5. 4.6 cm complex cyst lower pole left kidney with suspected thin septations, slightly increased in size compared to 2021. When the patient is clinically stable and able to follow directions and hold their breath (preferably as an outpatient) further evaluation with dedicated abdominal MRI should be considered. 6. Aortic atherosclerosis. Aortic Atherosclerosis (ICD10-I70.0). Electronically Signed  By: Jasmine Pang M.D.   On: 08/26/2022 23:45   DG Chest Portable 1 View  Result Date: 08/26/2022 CLINICAL DATA:  Recurrent rectal pain.  Anterior chest wall pain. EXAM: PORTABLE CHEST 1 VIEW COMPARISON:  08/21/2022 FINDINGS: Shallow inspiration. Heart size and pulmonary vascularity are normal for technique. Linear fibrosis in the left mid lung. No airspace disease or consolidation in the lungs. No pleural effusions. No pneumothorax. Mediastinal contours appear intact. Calcification of the aorta. IMPRESSION: Shallow inspiration.  No evidence of active pulmonary disease. Electronically Signed   By: Burman Nieves M.D.   On: 08/26/2022 21:18   CT Soft Tissue Neck W Contrast  Result Date: 08/21/2022 CLINICAL DATA:  62 year old female with increasing headache. Neck pain, bilateral extremity swelling. History of stroke, recently discharged from rehabilitation. By report in 2021 the patient was diagnosed with leukemia/lymphoma. EXAM: CT NECK WITH CONTRAST  TECHNIQUE: Multidetector CT imaging of the neck was performed using the standard protocol following the bolus administration of intravenous contrast. RADIATION DOSE REDUCTION: This exam was performed according to the departmental dose-optimization program which includes automated exposure control, adjustment of the mA and/or kV according to patient size and/or use of iterative reconstruction technique. CONTRAST:  75mL OMNIPAQUE IOHEXOL 350 MG/ML SOLN COMPARISON:  Head CT today reported separately. CTA head and neck 09/15/2021. previous neck CT 10/15/2019. FINDINGS: Pharynx and larynx: Hypopharynx motion artifact. The glottis is closed today. No laryngeal or pharyngeal mass is identified. Parapharyngeal spaces are within normal limits. Stable retropharyngeal space with partially retropharyngeal course of the right carotid. Salivary glands: Sublingual space, submandibular glands and parotid glands are stable from last year, within normal limits. Thyroid: Stable. Chronically diminutive or absent left thyroid lobe. Right thyroid and isthmus heterogeneity appears stable since 2021 In the setting of significant comorbidity (history of cancer) no follow-up recommended (ref: J Am Coll Radiol. 2015 Feb;12(2): 143-50). Lymph nodes: Generalized cervical lymphadenopathy, very similar to the 2021 neck CT. The largest individual lymph nodes now are 12-16 mm short axis. Only the bilateral level 5 nodal stations are relatively spared. No cystic or necrotic lymph nodes. Vascular: Partially occluded right vertebral artery again noted. Chronic cervical carotid atherosclerosis, partially retropharyngeal course. Bilateral internal jugular veins remain patent. Limited intracranial: Stable, negative. Visualized orbits: Stable, negative. Mastoids and visualized paranasal sinuses: Visualized paranasal sinuses and mastoids are stable and well aerated. Skeleton: No acute or suspicious osseous lesion identified. Cervical spine degeneration and  marrow heterogeneity appear not significantly changed since the 2021 CT. Upper chest: Aberrant origin right subclavian artery, normal variant. Bulky partially visible bilateral axillary lymphadenopathy. But no visible superior mediastinal lymphadenopathy. Lung apices are clear with mild respiratory motion. Mild proximal great vessel atherosclerosis. IMPRESSION: 1. Generalized cervical and partially visible bilateral bulky axillary lymphadenopathy consistent with active leukemia/lymphoma (reportedly diagnosed in 2021). Cervical lymphadenopathy now very similar to a 2021 Neck CT. 2. No other acute or inflammatory process identified in the Neck. Electronically Signed   By: Odessa Fleming M.D.   On: 08/21/2022 11:50   CT Head Wo Contrast  Result Date: 08/21/2022 CLINICAL DATA:  Headache, increasing frequency or severity. EXAM: CT HEAD WITHOUT CONTRAST TECHNIQUE: Contiguous axial images were obtained from the base of the skull through the vertex without intravenous contrast. RADIATION DOSE REDUCTION: This exam was performed according to the departmental dose-optimization program which includes automated exposure control, adjustment of the mA and/or kV according to patient size and/or use of iterative reconstruction technique. COMPARISON:  Head CT 06/11/2022, MR brain 06/12/2022 FINDINGS: Brain:  There is a wedge-shaped defect in the ventral medulla in the location of the previous evolving infarct. No asymmetry is seen concerning for an acute cortical based infarct, hemorrhage or mass effect. The ventricles are normal in size and position. There are scattered calcifications along the falx. Slight cortical atrophy both cerebral hemispheres. Mild small-vessel changes in the cerebral white matter also are again noted. Vascular: Calcified plaque both siphons, both distal vertebral arteries. No hyperdense central vessels. Skull: Negative for fractures or focal lesions. Sinuses/Orbits: Mild chronic membrane thickening both  maxillary sinuses. Other visualized sinuses are clear. There is scattered fluid in the lower left mastoid air cells, unchanged with clear right mastoids. Other: None. IMPRESSION: 1. No acute intracranial CT findings. 2. Wedge-shaped defect in the ventral medulla in the location of the previous evolving infarct. 3. Chronic left mastoid effusion. Electronically Signed   By: Almira Bar M.D.   On: 08/21/2022 08:00   DG Chest Portable 1 View  Result Date: 08/21/2022 CLINICAL DATA:  Shortness of breath. EXAM: PORTABLE CHEST 1 VIEW COMPARISON:  Portable chest 07/09/2022 FINDINGS: There is a low inspiration on exam. The lungs clear of acute infiltrates and no pleural collection is seen. Linear scarring is again noted left mid field. There is mild cardiomegaly without evidence of CHF. Calcific plaque noted aortic arch with normal mediastinal configuration. Mild thoracic spondylosis.  Multiple overlying monitor wires. IMPRESSION: No acute cardiopulmonary findings. Mild cardiomegaly. Aortic atherosclerosis. Electronically Signed   By: Almira Bar M.D.   On: 08/21/2022 07:53    ASSESSMENT & PLAN:    62 yo with   1) Small lymphocytic lymphoma-at least stage IIIA-trisomy 12 mutation Diagnosed incidentally as LNadenopathy noted on routine screening mamogram. No consitutional symptoms.  05/31/2019 Mammogram ([id number]) revealed "Further evaluation is suggested for prominent lymph nodes in the bilateral axilla." 06/26/2019 Flow Pathology ([id number]) revealed "Monoclonal B-cell population identified." 06/26/2019 Right Axilla LN Bx ([id number]) revealed "NON-HODGKIN B-CELL LYMPHOMA"  07/16/2019 FISH/CLL Prognostic Panel ([id number]) revealed "Trisomy 12 (+12) is present."  10/15/2019 CT Soft Tissue Neck ([id number]) revealed bilateral cervical lymphadenopathy. 10/15/2019 CT C/A/P ([id number]) ([id number]) revealed several small lymph nodes in axillary, retroperitoneal, and pelvic regions. Incidentally  noted complex renal cyst.   2) RBC microcytosis without Anemia and normal RDW and relative erythrocytosis -- due to alpha thal trait Alpha-Thalassemia Comment:  Comment: (NOTE) Test: Alpha-Thalassemia, DNA Analysis Result:     Alpha-+-thalassemia trait, alpha alpha/alpha- Mutation(s) identified: alpha3.7    3) nonmelanoma skin cancer on lower extremities. -Continue follow-up with dermatology for evaluation and management. -I discussed with the patient that her non-Hodgkin's lymphoma/SLL could be a risk factor for recurrent nonmelanoma skin cancers.  4) . 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   AKI (acute kidney injury) (HCC) 06/01/2022   Dizziness 05/29/2022   Personal history of CLL (chronic lymphocytic leukemia) 05/29/2022   Abdominal pain 05/29/2022   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 disease, with long-term current use of insulin (HCC) 04/14/2022   Type 2 diabetes mellitus with diabetic polyneuropathy, with long-term current use of insulin (HCC) 04/14/2022   Antiplatelet or antithrombotic long-term use 03/16/2022   Hyperlipidemia LDL goal <70 10/01/2021  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HCC) 09/17/2021   Stage 3a chronic kidney disease (CKD) (HCC) 09/16/2021   Thyroid nodule 09/16/2021   Cervical lymphadenopathy 09/16/2021   Acute CVA (cerebrovascular accident) (HCC) 09/15/2021   GERD (gastroesophageal reflux disease) 04/30/2021   Peripheral arterial disease (HCC) 04/13/2021   Atherosclerosis of aorta (HCC) 02/14/2021   Cerebral thrombosis with cerebral infarction 02/08/2021   Abnormal MRI of head 02/07/2021   Vertigo  02/07/2021   Class 1 obesity due to excess calories with body mass index (BMI) of 31.0 to 31.9 in adult 02/07/2021   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   Trigger finger, left ring finger 10/04/2018   Alternating constipation and diarrhea 04/20/2018   Diabetic peripheral neuropathy associated with type 2 diabetes mellitus (HCC) 02/26/2018   DM (diabetes mellitus), type 2 with peripheral vascular complications (HCC) 07/29/2015   ABNORMALITY OF GAIT 05/10/2010   ECZEMA, ATOPIC 04/03/2009   Hyperlipidemia associated with type 2 diabetes mellitus (HCC) 03/06/2009   BACK PAIN 11/14/2008   Hemiparesis affecting left side as late effect of stroke (HCC) 08/05/2008   DIPLOPIA 07/15/2008   Essential hypertension 07/15/2008   PLAN: -Discussed lab results from today, 05/13/20224, with the patient. CBC shows decreased hemoglobin at 11.3 g/dL and decreased hematocrit at 34.2%. CMP shows elevated glucose level at 170 and slightly elevated ALT at 46.  -Discussed CT Angio Chest/Abd/Pel results from 05/29/2022 in detail with the patient.  -Discussed CT Abdominal/Pelvis results from 08/26/2022 in detail with the patient as well. Showed Multiple enlarged retroperitoneal, mesenteric, pelvic and inguinal lymph nodes, increased compared to prior exam from January 2024 and consistent with history of leukemia/lymphoma. -recommended to follow-up with PCP.  -Clinically, lymph nodes are larger than last visit but they are not bothering the patient as much.  -Patient does not meet criteria for initiating treatment for SLL right now.  -The threshold to treat her would be high given her other significant medical issues.   FOLLOW-UP: RTC with Dr Candise Che with labs in 6 months   The total time spent in the appointment was 30 minutes* .  All of the patient's questions were answered with apparent satisfaction. The patient knows to call the clinic with any problems, questions or concerns.   Wyvonnia Lora MD MS AAHIVMS  [facility] Hematology/Oncology Physician [facility]  .*Total Encounter Time as defined by the Centers for Medicare and Medicaid Services includes, in addition to the face-to-face time of a patient visit (documented in the note above) non-face-to-face time: obtaining and reviewing outside history, ordering and reviewing medications, tests or procedures, care coordination (communications with other health care professionals or caregivers) and documentation in the medical record.   I, Ok Edwards, am acting as a Neurosurgeon for Wyvonnia Lora, MD. .I have reviewed the above documentation for accuracy and completeness, and I agree with the above. Johney Maine MD

## 2022-09-13 ENCOUNTER — Telehealth: Payer: Self-pay | Admitting: Hematology

## 2022-09-13 ENCOUNTER — Telehealth: Payer: Self-pay

## 2022-09-13 NOTE — Telephone Encounter (Signed)
I spoke with pt, she is aware L[facility] is here for pick up.

## 2022-09-23 ENCOUNTER — Emergency Department (HOSPITAL_COMMUNITY): Payer: Commercial Managed Care - HMO

## 2022-09-23 ENCOUNTER — Other Ambulatory Visit: Payer: Self-pay

## 2022-09-23 ENCOUNTER — Inpatient Hospital Stay (HOSPITAL_COMMUNITY)
Admission: EM | Admit: 2022-09-23 | Discharge: 2022-09-27 | DRG: 177 | Disposition: A | Discharge status: Skilled Nursing Facility | Payer: Commercial Managed Care - HMO | Source: Skilled Nursing Facility | Attending: Family Medicine | Admitting: Family Medicine

## 2022-09-23 ENCOUNTER — Encounter (HOSPITAL_COMMUNITY): Payer: Self-pay

## 2022-09-23 DIAGNOSIS — I69328 Other speech and language deficits following cerebral infarction: Secondary | ICD-10-CM

## 2022-09-23 DIAGNOSIS — K626 Ulcer of anus and rectum: Secondary | ICD-10-CM | POA: Diagnosis present

## 2022-09-23 DIAGNOSIS — E440 Moderate protein-calorie malnutrition: Secondary | ICD-10-CM | POA: Diagnosis present

## 2022-09-23 DIAGNOSIS — J690 Pneumonitis due to inhalation of food and vomit: Principal | ICD-10-CM | POA: Diagnosis present

## 2022-09-23 DIAGNOSIS — E1165 Type 2 diabetes mellitus with hyperglycemia: Secondary | ICD-10-CM | POA: Diagnosis present

## 2022-09-23 DIAGNOSIS — E119 Type 2 diabetes mellitus without complications: Secondary | ICD-10-CM

## 2022-09-23 DIAGNOSIS — R112 Nausea with vomiting, unspecified: Secondary | ICD-10-CM | POA: Diagnosis present

## 2022-09-23 DIAGNOSIS — Z79899 Other long term (current) drug therapy: Secondary | ICD-10-CM

## 2022-09-23 DIAGNOSIS — R1312 Dysphagia, oropharyngeal phase: Secondary | ICD-10-CM | POA: Diagnosis present

## 2022-09-23 DIAGNOSIS — Z7985 Long-term (current) use of injectable non-insulin antidiabetic drugs: Secondary | ICD-10-CM

## 2022-09-23 DIAGNOSIS — E785 Hyperlipidemia, unspecified: Secondary | ICD-10-CM | POA: Diagnosis present

## 2022-09-23 DIAGNOSIS — R0602 Shortness of breath: Secondary | ICD-10-CM | POA: Diagnosis not present

## 2022-09-23 DIAGNOSIS — Z1152 Encounter for screening for COVID-19: Secondary | ICD-10-CM

## 2022-09-23 DIAGNOSIS — K219 Gastro-esophageal reflux disease without esophagitis: Secondary | ICD-10-CM | POA: Diagnosis present

## 2022-09-23 DIAGNOSIS — F419 Anxiety disorder, unspecified: Secondary | ICD-10-CM | POA: Diagnosis present

## 2022-09-23 DIAGNOSIS — I69391 Dysphagia following cerebral infarction: Secondary | ICD-10-CM

## 2022-09-23 DIAGNOSIS — R0902 Hypoxemia: Principal | ICD-10-CM

## 2022-09-23 DIAGNOSIS — Z7401 Bed confinement status: Secondary | ICD-10-CM

## 2022-09-23 DIAGNOSIS — M542 Cervicalgia: Secondary | ICD-10-CM | POA: Diagnosis present

## 2022-09-23 DIAGNOSIS — R7989 Other specified abnormal findings of blood chemistry: Secondary | ICD-10-CM | POA: Diagnosis present

## 2022-09-23 DIAGNOSIS — F32A Depression, unspecified: Secondary | ICD-10-CM | POA: Diagnosis present

## 2022-09-23 DIAGNOSIS — E860 Dehydration: Secondary | ICD-10-CM | POA: Diagnosis present

## 2022-09-23 DIAGNOSIS — N1831 Chronic kidney disease, stage 3a: Secondary | ICD-10-CM | POA: Diagnosis present

## 2022-09-23 DIAGNOSIS — R0600 Dyspnea, unspecified: Secondary | ICD-10-CM

## 2022-09-23 DIAGNOSIS — R197 Diarrhea, unspecified: Secondary | ICD-10-CM | POA: Diagnosis present

## 2022-09-23 DIAGNOSIS — I10 Essential (primary) hypertension: Secondary | ICD-10-CM | POA: Diagnosis present

## 2022-09-23 DIAGNOSIS — Z794 Long term (current) use of insulin: Secondary | ICD-10-CM

## 2022-09-23 DIAGNOSIS — I69354 Hemiplegia and hemiparesis following cerebral infarction affecting left non-dominant side: Secondary | ICD-10-CM

## 2022-09-23 DIAGNOSIS — Z7982 Long term (current) use of aspirin: Secondary | ICD-10-CM

## 2022-09-23 DIAGNOSIS — J9601 Acute respiratory failure with hypoxia: Secondary | ICD-10-CM | POA: Diagnosis present

## 2022-09-23 DIAGNOSIS — E6609 Other obesity due to excess calories: Secondary | ICD-10-CM | POA: Diagnosis present

## 2022-09-23 DIAGNOSIS — Z7902 Long term (current) use of antithrombotics/antiplatelets: Secondary | ICD-10-CM

## 2022-09-23 DIAGNOSIS — Z8669 Personal history of other diseases of the nervous system and sense organs: Secondary | ICD-10-CM

## 2022-09-23 DIAGNOSIS — R590 Localized enlarged lymph nodes: Secondary | ICD-10-CM | POA: Diagnosis present

## 2022-09-23 DIAGNOSIS — Z6826 Body mass index (BMI) 26.0-26.9, adult: Secondary | ICD-10-CM

## 2022-09-23 DIAGNOSIS — C8590 Non-Hodgkin lymphoma, unspecified, unspecified site: Secondary | ICD-10-CM | POA: Diagnosis present

## 2022-09-23 DIAGNOSIS — L309 Dermatitis, unspecified: Secondary | ICD-10-CM | POA: Diagnosis present

## 2022-09-23 LAB — CBC
HCT: 31.4 % — ABNORMAL LOW (ref 36.0–46.0)
Hemoglobin: 10.1 g/dL — ABNORMAL LOW (ref 12.0–15.0)
MCH: 25.3 pg — ABNORMAL LOW (ref 26.0–34.0)
MCHC: 32.2 g/dL (ref 30.0–36.0)
MCV: 78.5 fL — ABNORMAL LOW (ref 80.0–100.0)
Platelets: 213 10*3/uL (ref 150–400)
RBC: 4 MIL/uL (ref 3.87–5.11)
RDW: 15.9 % — ABNORMAL HIGH (ref 11.5–15.5)
WBC: 7.2 10*3/uL (ref 4.0–10.5)
nRBC: 0 % (ref 0.0–0.2)

## 2022-09-23 LAB — COMPREHENSIVE METABOLIC PANEL
ALT: 124 U/L — ABNORMAL HIGH (ref 0–44)
AST: 101 U/L — ABNORMAL HIGH (ref 15–41)
Albumin: 2.9 g/dL — ABNORMAL LOW (ref 3.5–5.0)
Alkaline Phosphatase: 106 U/L (ref 38–126)
Anion gap: 9 (ref 5–15)
BUN: 18 mg/dL (ref 8–23)
CO2: 22 mmol/L (ref 22–32)
Calcium: 9.1 mg/dL (ref 8.9–10.3)
Chloride: 104 mmol/L (ref 98–111)
Creatinine, Ser: 0.96 mg/dL (ref 0.44–1.00)
GFR, Estimated: >60 mL/min (ref >60)
Glucose, Bld: 141 mg/dL — ABNORMAL HIGH (ref 70–99)
Potassium: 4.7 mmol/L (ref 3.5–5.1)
Sodium: 135 mmol/L (ref 135–145)
Total Bilirubin: 0.9 mg/dL (ref 0.3–1.2)
Total Protein: 6.7 g/dL (ref 6.5–8.1)

## 2022-09-23 LAB — BRAIN NATRIURETIC PEPTIDE: B Natriuretic Peptide: 94.2 pg/mL (ref 0.0–100.0)

## 2022-09-23 MED ORDER — ONDANSETRON HCL 4 MG/2ML IJ SOLN
4.0000 mg | Freq: Once | INTRAMUSCULAR | Status: AC
  Administered 2022-09-23: 4 mg via INTRAVENOUS
  Filled 2022-09-23: qty 2

## 2022-09-23 MED ORDER — ACETAMINOPHEN 500 MG PO TABS
1000.0000 mg | ORAL_TABLET | ORAL | Status: AC
  Administered 2022-09-23: 1000 mg via ORAL
  Filled 2022-09-23: qty 2

## 2022-09-23 MED ORDER — SODIUM CHLORIDE 0.9 % IV BOLUS
250.0000 mL | Freq: Once | INTRAVENOUS | Status: AC
  Administered 2022-09-23: 250 mL via INTRAVENOUS

## 2022-09-23 NOTE — ED Triage Notes (Signed)
Pt from [facility] c/o 3 days of N/V with SOB x 1 day. Staff palced pt on 3L and had her lying flat. Sats 90% on arrival of EMS. Once they sat her up sats improved to 100% on 3L. Rhonci noted to upper lobes

## 2022-09-24 ENCOUNTER — Emergency Department (HOSPITAL_COMMUNITY): Payer: Commercial Managed Care - HMO

## 2022-09-24 DIAGNOSIS — R0602 Shortness of breath: Secondary | ICD-10-CM

## 2022-09-24 DIAGNOSIS — J9601 Acute respiratory failure with hypoxia: Secondary | ICD-10-CM | POA: Diagnosis present

## 2022-09-24 DIAGNOSIS — R112 Nausea with vomiting, unspecified: Secondary | ICD-10-CM | POA: Diagnosis present

## 2022-09-24 DIAGNOSIS — R7989 Other specified abnormal findings of blood chemistry: Secondary | ICD-10-CM | POA: Diagnosis present

## 2022-09-24 LAB — COMPREHENSIVE METABOLIC PANEL
ALT: 125 U/L — ABNORMAL HIGH (ref 0–44)
AST: 66 U/L — ABNORMAL HIGH (ref 15–41)
Albumin: 2.9 g/dL — ABNORMAL LOW (ref 3.5–5.0)
Alkaline Phosphatase: 109 U/L (ref 38–126)
Anion gap: 12 (ref 5–15)
BUN: 16 mg/dL (ref 8–23)
CO2: 23 mmol/L (ref 22–32)
Calcium: 9.4 mg/dL (ref 8.9–10.3)
Chloride: 101 mmol/L (ref 98–111)
Creatinine, Ser: 0.9 mg/dL (ref 0.44–1.00)
GFR, Estimated: >60 mL/min (ref >60)
Glucose, Bld: 143 mg/dL — ABNORMAL HIGH (ref 70–99)
Potassium: 3.6 mmol/L (ref 3.5–5.1)
Sodium: 136 mmol/L (ref 135–145)
Total Bilirubin: 0.6 mg/dL (ref 0.3–1.2)
Total Protein: 7 g/dL (ref 6.5–8.1)

## 2022-09-24 LAB — CBC
HCT: 31.1 % — ABNORMAL LOW (ref 36.0–46.0)
Hemoglobin: 10.2 g/dL — ABNORMAL LOW (ref 12.0–15.0)
MCH: 25.7 pg — ABNORMAL LOW (ref 26.0–34.0)
MCHC: 32.8 g/dL (ref 30.0–36.0)
MCV: 78.3 fL — ABNORMAL LOW (ref 80.0–100.0)
Platelets: 300 10*3/uL (ref 150–400)
RBC: 3.97 MIL/uL (ref 3.87–5.11)
RDW: 15.9 % — ABNORMAL HIGH (ref 11.5–15.5)
WBC: 5.7 10*3/uL (ref 4.0–10.5)
nRBC: 0 % (ref 0.0–0.2)

## 2022-09-24 LAB — GLUCOSE, CAPILLARY
Glucose-Capillary: 112 mg/dL — ABNORMAL HIGH (ref 70–99)
Glucose-Capillary: 113 mg/dL — ABNORMAL HIGH (ref 70–99)
Glucose-Capillary: 116 mg/dL — ABNORMAL HIGH (ref 70–99)
Glucose-Capillary: 136 mg/dL — ABNORMAL HIGH (ref 70–99)
Glucose-Capillary: 186 mg/dL — ABNORMAL HIGH (ref 70–99)

## 2022-09-24 LAB — RESP PANEL BY RT-PCR (RSV, FLU A&B, COVID)  RVPGX2
Influenza A by PCR: NEGATIVE
Influenza B by PCR: NEGATIVE
Resp Syncytial Virus by PCR: NEGATIVE
SARS Coronavirus 2 by RT PCR: NEGATIVE

## 2022-09-24 LAB — I-STAT VENOUS BLOOD GAS, ED
Acid-Base Excess: 2 mmol/L (ref 0.0–2.0)
Bicarbonate: 24.8 mmol/L (ref 20.0–28.0)
Calcium, Ion: 1.2 mmol/L (ref 1.15–1.40)
HCT: 31 % — ABNORMAL LOW (ref 36.0–46.0)
Hemoglobin: 10.5 g/dL — ABNORMAL LOW (ref 12.0–15.0)
O2 Saturation: 96 %
Patient temperature: 37
Potassium: 3.7 mmol/L (ref 3.5–5.1)
Sodium: 137 mmol/L (ref 135–145)
TCO2: 26 mmol/L (ref 22–32)
pCO2, Ven: 30.5 mmHg — ABNORMAL LOW (ref 44–60)
pH, Ven: 7.518 — ABNORMAL HIGH (ref 7.25–7.43)
pO2, Ven: 70 mmHg — ABNORMAL HIGH (ref 32–45)

## 2022-09-24 LAB — MRSA NEXT GEN BY PCR, NASAL: MRSA by PCR Next Gen: NOT DETECTED

## 2022-09-24 LAB — LACTIC ACID, PLASMA
Lactic Acid, Venous: 0.7 mmol/L (ref 0.5–1.9)
Lactic Acid, Venous: 0.7 mmol/L (ref 0.5–1.9)

## 2022-09-24 LAB — TROPONIN I (HIGH SENSITIVITY): Troponin I (High Sensitivity): 11 ng/L (ref <18)

## 2022-09-24 LAB — MAGNESIUM: Magnesium: 2.2 mg/dL (ref 1.7–2.4)

## 2022-09-24 LAB — PROCALCITONIN: Procalcitonin: 0.33 ng/mL

## 2022-09-24 MED ORDER — ACETAMINOPHEN 650 MG RE SUPP: 650.0000 mg | Freq: Four times a day (QID) | RECTAL | Status: DC | PRN

## 2022-09-24 MED ORDER — SODIUM CHLORIDE 0.9 % IV SOLN: INTRAVENOUS | Status: DC

## 2022-09-24 MED ORDER — SODIUM CHLORIDE 0.9 % IV SOLN: 250.0000 mL | INTRAVENOUS | Status: DC | PRN

## 2022-09-24 MED ORDER — SODIUM CHLORIDE 0.9% FLUSH
3.0000 mL | Freq: Two times a day (BID) | INTRAVENOUS | Status: DC
  Administered 2022-09-24 – 2022-09-27 (×6): 3 mL via INTRAVENOUS

## 2022-09-24 MED ORDER — SODIUM CHLORIDE 0.9 % IV SOLN
3.0000 g | Freq: Four times a day (QID) | INTRAVENOUS | Status: DC
  Administered 2022-09-24 – 2022-09-26 (×9): 3 g via INTRAVENOUS
  Filled 2022-09-24 (×9): qty 8

## 2022-09-24 MED ORDER — EZETIMIBE 10 MG PO TABS
10.0000 mg | ORAL_TABLET | Freq: Every day | ORAL | Status: DC
  Administered 2022-09-24 – 2022-09-27 (×4): 10 mg via ORAL
  Filled 2022-09-24 (×4): qty 1

## 2022-09-24 MED ORDER — ASPIRIN 81 MG PO TBEC
81.0000 mg | DELAYED_RELEASE_TABLET | Freq: Every day | ORAL | Status: DC
  Administered 2022-09-24 – 2022-09-27 (×4): 81 mg via ORAL
  Filled 2022-09-24 (×4): qty 1

## 2022-09-24 MED ORDER — IPRATROPIUM-ALBUTEROL 0.5-2.5 (3) MG/3ML IN SOLN
3.0000 mL | Freq: Once | RESPIRATORY_TRACT | Status: AC
  Administered 2022-09-24: 3 mL via RESPIRATORY_TRACT
  Filled 2022-09-24: qty 3

## 2022-09-24 MED ORDER — IOHEXOL 350 MG/ML SOLN
75.0000 mL | Freq: Once | INTRAVENOUS | Status: AC | PRN
  Administered 2022-09-24: 75 mL via INTRAVENOUS

## 2022-09-24 MED ORDER — HEPARIN SODIUM (PORCINE) 5000 UNIT/ML IJ SOLN
5000.0000 [IU] | Freq: Two times a day (BID) | INTRAMUSCULAR | Status: DC
  Administered 2022-09-24 – 2022-09-27 (×7): 5000 [IU] via SUBCUTANEOUS
  Filled 2022-09-24 (×7): qty 1

## 2022-09-24 MED ORDER — TICAGRELOR 90 MG PO TABS
90.0000 mg | ORAL_TABLET | Freq: Two times a day (BID) | ORAL | Status: DC
  Administered 2022-09-24 – 2022-09-27 (×7): 90 mg via ORAL
  Filled 2022-09-24 (×7): qty 1

## 2022-09-24 MED ORDER — ACETAMINOPHEN 325 MG PO TABS
650.0000 mg | ORAL_TABLET | Freq: Four times a day (QID) | ORAL | Status: DC | PRN
  Administered 2022-09-25 – 2022-09-27 (×3): 650 mg via ORAL
  Filled 2022-09-24 (×3): qty 2

## 2022-09-24 MED ORDER — PANTOPRAZOLE SODIUM 40 MG IV SOLR
40.0000 mg | Freq: Two times a day (BID) | INTRAVENOUS | Status: DC
  Administered 2022-09-24 – 2022-09-26 (×7): 40 mg via INTRAVENOUS
  Filled 2022-09-24 (×7): qty 10

## 2022-09-24 MED ORDER — INSULIN ASPART 100 UNIT/ML IJ SOLN
0.0000 [IU] | Freq: Three times a day (TID) | INTRAMUSCULAR | Status: DC
  Administered 2022-09-24: 1 [IU] via SUBCUTANEOUS
  Administered 2022-09-25: 2 [IU] via SUBCUTANEOUS
  Administered 2022-09-25 – 2022-09-27 (×4): 1 [IU] via SUBCUTANEOUS
  Administered 2022-09-27: 2 [IU] via SUBCUTANEOUS
  Administered 2022-09-27: 1 [IU] via SUBCUTANEOUS

## 2022-09-24 MED ORDER — FUROSEMIDE 10 MG/ML IJ SOLN
20.0000 mg | Freq: Once | INTRAMUSCULAR | Status: AC
  Administered 2022-09-24: 20 mg via INTRAVENOUS
  Filled 2022-09-24: qty 2

## 2022-09-24 MED ORDER — FLUOXETINE HCL 20 MG PO CAPS
20.0000 mg | ORAL_CAPSULE | Freq: Every day | ORAL | Status: DC
  Administered 2022-09-24 – 2022-09-27 (×4): 20 mg via ORAL
  Filled 2022-09-24 (×4): qty 1

## 2022-09-24 MED ORDER — SODIUM CHLORIDE 0.9% FLUSH: 3.0000 mL | INTRAVENOUS | Status: DC | PRN

## 2022-09-24 MED ORDER — ATORVASTATIN CALCIUM 80 MG PO TABS
80.0000 mg | ORAL_TABLET | Freq: Every day | ORAL | Status: DC
  Administered 2022-09-24 – 2022-09-27 (×4): 80 mg via ORAL
  Filled 2022-09-24 (×4): qty 1

## 2022-09-24 MED ORDER — SODIUM CHLORIDE 0.9% FLUSH
3.0000 mL | Freq: Two times a day (BID) | INTRAVENOUS | Status: DC
  Administered 2022-09-24 – 2022-09-27 (×7): 3 mL via INTRAVENOUS

## 2022-09-24 NOTE — Progress Notes (Signed)
Pt arrived on unit and stable.

## 2022-09-24 NOTE — Assessment & Plan Note (Addendum)
Patient has a history of GERD and history of rectal bleeding and GI bleed. No report of any melena of hematochezia. CBC    Component Value Date/Time   WBC 5.7 09/24/2022 0406   RBC 3.97 09/24/2022 0406   HGB 10.2 (L) 09/24/2022 0406   HGB 11.3 (L) 09/12/2022 1134   HCT 31.1 (L) 09/24/2022 0406   PLT 300 09/24/2022 0406   PLT 329 09/12/2022 1134   MCV 78.3 (L) 09/24/2022 0406   MCH 25.7 (L) 09/24/2022 0406   MCHC 32.8 09/24/2022 0406   RDW 15.9 (H) 09/24/2022 0406   LYMPHSABS 2.7 09/12/2022 1134   MONOABS 0.9 09/12/2022 1134   EOSABS 0.2 09/12/2022 1134   BASOSABS 0.0 09/12/2022 1134      Latest Ref Rng & Units 09/24/2022    4:06 AM 09/24/2022   12:28 AM 09/23/2022   10:37 PM  CBC  WBC 4.0 - 10.5 K/uL 5.7   7.2   Hemoglobin 12.0 - 15.0 g/dL 54.0  98.1  19.1   Hematocrit 36.0 - 46.0 % 31.1  31.0  31.4   Platelets 150 - 400 K/uL 300   213   Stable we will follow.  Iv ppi.

## 2022-09-24 NOTE — ED Notes (Signed)
US at bedside

## 2022-09-24 NOTE — Assessment & Plan Note (Signed)
At baseline patient has hemiparesis on the left side of her arm and leg. Will continue patient on aspirin 81, Lipitor, Zetia.

## 2022-09-24 NOTE — Assessment & Plan Note (Addendum)
Swallow eval / Glycemic protocol.  Currently we will hold patient's Glargine and NovoLog.

## 2022-09-24 NOTE — Assessment & Plan Note (Addendum)
    Latest Ref Rng & Units 09/23/2022   10:37 PM 09/12/2022   11:34 AM 08/21/2022    7:03 AM  Hepatic Function  Total Protein 6.5 - 8.1 g/dL 6.7  7.2  6.9   Albumin 3.5 - 5.0 g/dL 2.9  3.6  3.0   AST 15 - 41 U/L 101  31  32   ALT 0 - 44 U/L 124  46  36   Alk Phosphatase 38 - 126 U/L 106  102  91   Total Bilirubin 0.3 - 1.2 mg/dL 0.9  0.4  0.6    Differentials include passive hepatic congestion, viral illness, abnormal gallbladder finding on CT requested ultrasound of the right upper quadrant.

## 2022-09-24 NOTE — Progress Notes (Signed)
Seen and examined and agree with Dr. 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 Wyvonnia Lora for NHL-is at very high threshold for chemo per him as she is chronically debilitated secondary to her strokes etc. for treatment and last scans showed enlarging lymph nodes  Return to ED SOB as she was less than 90% on room air, Rx Unasyn IVF in ED given she was found to have pneumonia  At the bedside she is on oxygen She appears chronically debilitated has dense plegia on the left side unable to lift either left arm or left lower extremity off the bed She seems surprised that she has a diagnosis of cancer/lymphoma-nursing collaboratively states that she was "confused" on arrival and she does not seem to recall things No family is at bedside  Plan  Continue antibiotics, use saline 75 cc/H-stop Lasix that was started earlier today SLP to see although previously in 08/2022 she has been on a regular diet?-I am placing her on a dysphagia 1--- would continue secondary prevention measures for stroke including ticagrelor Will discuss with sister Implication of poor prognosis with recurrent pneumonias in the setting of dysphagia likely from recurrent strokes Hold all hypoglycemic agents including insulin for now May resume Canasa suppositories if she does have discomfort or bleeding CC Dr. Candise Che   D/w Marina Gravel [phone] and discussed with her  Pleas Koch, MD [facility] 2:38 PM

## 2022-09-24 NOTE — Progress Notes (Signed)
Pharmacy Antibiotic Note  [name] is a 62 y.o. female admitted on 09/23/2022 with concern for aspiration pneumonia.  Pharmacy has been consulted for Unasyn dosing.  Plan: Unasyn 3g IV Q6H.  Height: 5\' 4"  (162.6 cm) Weight: 69.4 kg (153 lb) IBW/kg (Calculated) : 54.7  Temp (24hrs), Avg:98 F (36.7 C), Min:97.6 F (36.4 C), Max:98.2 F (36.8 C)  Recent Labs  Lab 09/23/22 2237 09/24/22 0017 09/24/22 0215 09/24/22 0406  WBC 7.2  --   --  5.7  CREATININE 0.96  --   --   --   LATICACIDVEN  --  0.7 0.7  --     Estimated Creatinine Clearance: 57.4 mL/min (by C-G formula based on SCr of 0.96 mg/dL).    No Known Allergies   Thank you for allowing pharmacy to be a part of this patient's care.  Vernard Gambles, PharmD, BCPS  09/24/2022 6:20 AM

## 2022-09-24 NOTE — ED Provider Notes (Signed)
[place] [facility]PARTMENT AT [place] Provider Note   CSN: [id number] Arrival date & time: 09/23/22  2140     History  Chief Complaint  Patient presents with   Shortness of Breath   Nausea   Emesis    [name] is a 62 y.o. female.   Shortness of Breath Associated symptoms: vomiting   Emesis Patient is 62 year old female with past medical history significant for non-Hodgkin's lymphoma, HTN, DM2, multiple strokes, GBS in 1988 had full recovery, eczema, HLD, obesity, atherosclerosis, PAD  She has had some nausea, diarrhea, vomiting for the past 3 days has had decreased intake and feels weak and dehydrated.  She complains of severe shortness of breath for the past 1 day and when EMS arrived oxygen saturations were 90% at rest.  She is feeling quite short of breath and coughing frequently.  She denies any chest pain apart from when she coughs.  She denies any hemoptysis syncope or near syncope.  She denies any abdominal pain or blood in her stool.    Home Medications Prior to Admission medications   Medication Sig Start Date End Date Taking? Authorizing Provider  acetaminophen (TYLENOL) 325 MG tablet Take 650 mg by mouth every 4 (four) hours as needed for mild pain. Do not exceed 3000mg  in 24 hours    [provider]  AMBULATORY NON FORMULARY MEDICATION Medication Name: Carafate 50 mg suppositories: Insert 1 suppository rectally twice a day for 2 weeks 08/31/22   Doree Albee, PA-C  AMBULATORY NON FORMULARY MEDICATION Medication Name: Diltiazem 2%/Lidocaine 2%   Using your index finger apply a small amount of medication inside the anal opening and to the external anal area twice daily x 6 weeks. 08/31/22   Doree Albee, PA-C  amLODipine (NORVASC) 10 MG tablet Take 1 tablet by mouth once daily 05/09/22   [place], [name], MD  aspirin EC 81 MG tablet Take 1 tablet (81 mg total) by mouth daily. Swallow whole. 06/18/22   Osvaldo Shipper, MD   atorvastatin (LIPITOR) 80 MG tablet Take 1 tablet (80 mg total) by mouth daily. 05/09/22   [place], [name], MD  B Complex Vitamins (VITAMIN B COMPLEX) TABS Take 1 tablet by mouth every morning.    [provider]  Blood Pressure Monitoring (BLOOD PRESSURE MONITOR AUTOMAT) DEVI 1 Device by Does not apply route daily. 08/29/18   [place], [name], MD  Cholecalciferol (VITAMIN D3 PO) Take 1 capsule by mouth every morning.    [provider]  ciprofloxacin (CIPRO) 500 MG tablet Take 1 tablet (500 mg total) by mouth 2 (two) times daily. 08/27/22   Linwood Dibbles, MD  Dulaglutide (TRULICITY) 0.75 MG/0.5ML SOPN Inject 0.75 mg into the skin once a week. Patient not taking: Reported on 09/06/2022 04/13/22   Shamleffer, Konrad Dolores, MD  ezetimibe (ZETIA) 10 MG tablet Take 1 tablet (10 mg total) by mouth daily. 05/09/22   [place], [name], MD  FLUoxetine (PROZAC) 20 MG capsule Take 1 capsule by mouth once daily 11/16/21   [place], [name], MD  glucose blood (CONTOUR NEXT TEST) test strip 1 each by Other route 2 (two) times daily. And lancets 2/day 03/09/18   Romero Belling, MD  glucose blood [facility] VERIO) test strip USE TO CHECK BLOOD SUGAR TWICE A DAY AND PRN 07/29/15   Gordy Savers, MD  hydrALAZINE (APRESOLINE) 50 MG tablet TAKE 1 TABLET BY MOUTH THREE TIMES DAILY 11/16/21   [place], [name], MD  HYDROcodone-acetaminophen (  NORCO/VICODIN) 5-325 MG tablet Take 1 tablet by mouth every 6 (six) hours as needed. 08/27/22   Linwood Dibbles, MD  hydrOXYzine (ATARAX) 50 MG tablet Take 50 mg by mouth 2 (two) times daily.    [provider]  insulin aspart (NOVOLOG) 100 UNIT/ML injection Inject 0-9 Units into the skin 3 (three) times daily with meals. 0-9 Units, Subcutaneous, 3 times daily with meals, First dose on Thu 07/07/22 at 0800 Correction coverage: Sensitive (thin, NPO, renal) CBG < 70: Implement Hypoglycemia Standing Orders and refer to Hypoglycemia Standing Orders sidebar report CBG 70 - 120: 0  units CBG 121 - 150: 1 unit CBG 151 - 200: 2 units CBG 201 - 250: 3 units CBG 251 - 300: 5 units CBG 301 - 350: 7 units CBG 351 - 400: 9 units CBG > 400: call MD. 07/11/22   Elease Etienne, MD  insulin glargine-yfgn (SEMGLEE) 100 UNIT/ML injection Inject 0.05 mLs (5 Units total) into the skin daily. 07/12/22   Hongalgi, Maximino Greenland, MD  loperamide (IMODIUM) 2 MG capsule Take 2 capsules (4 mg total) by mouth 3 (three) times daily as needed for diarrhea or loose stools. 06/17/22   Osvaldo Shipper, MD  mesalamine (ROWASA) 4 g enema Place 60 mLs (4 g total) rectally at bedtime. Lay on left side, retain as long as able 09/02/22   Doree Albee, PA-C  methocarbamol (ROBAXIN) 500 MG tablet Take 1 tablet (500 mg total) by mouth 2 (two) times daily. 08/21/22   Darrick Grinder, PA-C  metroNIDAZOLE (FLAGYL) 500 MG tablet Take 1 tablet (500 mg total) by mouth 2 (two) times daily. 08/27/22   Linwood Dibbles, MD  omeprazole (PRILOSEC OTC) 20 MG tablet Take 20 mg by mouth daily.    [provider]  ONE TOUCH LANCETS MISC USE TO CHECK BLOOD SUGAR TWICE A DAY AND PRN 07/29/15   Gordy Savers, MD  saccharomyces boulardii (FLORASTOR) 250 MG capsule Take 1 capsule (250 mg total) by mouth 2 (two) times daily. 06/17/22   Osvaldo Shipper, MD  ticagrelor (BRILINTA) 90 MG TABS tablet Take 1 tablet (90 mg total) by mouth 2 (two) times daily. 06/17/22   Osvaldo Shipper, MD  tiZANidine (ZANAFLEX) 2 MG tablet Take 1 tablet by mouth every 8 (eight) hours as needed for muscle spasms.    [provider]  traMADol (ULTRAM) 50 MG tablet Take 25 mg by mouth every 6 (six) hours as needed for moderate pain or severe pain.    [provider]      Allergies    Patient has no known allergies.    Review of Systems   Review of Systems  Respiratory:  Positive for shortness of breath.   Gastrointestinal:  Positive for vomiting.    Physical Exam Updated Vital Signs BP (!) 145/91   Pulse (!) 104   Temp 98.1 F  (36.7 C) (Oral)   Resp (!) 24   Ht 5\' 4"  (1.626 m)   Wt 69.4 kg   SpO2 97%   BMI 26.26 kg/m  Physical Exam Vitals and nursing note reviewed.  Constitutional:      Appearance: She is obese. She is ill-appearing.     Comments: Ill-appearing 62 year old female dyspneic but able to speak in short sentences  HENT:     Head: Normocephalic and atraumatic.     Nose: Nose normal.  Eyes:     General: No scleral icterus. Cardiovascular:     Rate and Rhythm: Normal rate and  regular rhythm.     Pulses: Normal pulses.     Heart sounds: Normal heart sounds.  Pulmonary:     Breath sounds: Rhonchi present. No wheezing.     Comments: Significantly increased work of breathing.  Rhonchi auscultated diffusely Abdominal:     Palpations: Abdomen is soft.     Tenderness: There is no abdominal tenderness.  Musculoskeletal:     Cervical back: Normal range of motion.     Right lower leg: No edema.     Left lower leg: No edema.  Skin:    General: Skin is warm and dry.     Capillary Refill: Capillary refill takes less than 2 seconds.  Neurological:     Mental Status: She is alert. Mental status is at baseline.  Psychiatric:        Mood and Affect: Mood normal.        Behavior: Behavior normal.     ED Results / Procedures / Treatments   Labs (all labs ordered are listed, but only abnormal results are displayed) Labs Reviewed  COMPREHENSIVE METABOLIC PANEL - Abnormal; Notable for the following components:      Result Value   Glucose, Bld 141 (*)    Albumin 2.9 (*)    AST 101 (*)    ALT 124 (*)    All other components within normal limits  CBC - Abnormal; Notable for the following components:   Hemoglobin 10.1 (*)    HCT 31.4 (*)    MCV 78.5 (*)    MCH 25.3 (*)    RDW 15.9 (*)    All other components within normal limits  RESP PANEL BY RT-PCR (RSV, FLU A&B, COVID)  RVPGX2  BRAIN NATRIURETIC PEPTIDE  LACTIC ACID, PLASMA  LACTIC ACID, PLASMA  I-STAT VENOUS BLOOD GAS, ED     EKG None  Radiology DG Chest 2 View  Result Date: 09/23/2022 CLINICAL DATA:  Shortness of breath. Left greater than right rhonchi. Hypoxia. EXAM: CHEST - 2 VIEW COMPARISON:  Radiograph 08/26/2022. CT 05/29/2022 FINDINGS: Lung volumes are low. Heart size upper normal. Stable mediastinal contours. Atelectasis/scarring in the left mid lung. No acute airspace disease. Normal pulmonary vasculature. No pleural fluid or pneumothorax no acute osseous abnormalities are seen. IMPRESSION: Low lung volumes with atelectasis/scarring in the left mid lung. Electronically Signed   By: Narda Rutherford M.D.   On: 09/23/2022 23:05    Procedures Procedures    Medications Ordered in ED Medications  acetaminophen (TYLENOL) tablet 1,000 mg (1,000 mg Oral Given 09/23/22 2301)  ondansetron (ZOFRAN) injection 4 mg (4 mg Intravenous Given 09/23/22 2301)  sodium chloride 0.9 % bolus 250 mL (250 mLs Intravenous New Bag/Given 09/23/22 2305)    ED Course/ Medical Decision Making/ A&P                             Medical Decision Making Amount and/or Complexity of Data Reviewed Labs: ordered. Radiology: ordered.  Risk OTC drugs. Prescription drug management. Decision regarding hospitalization.   This patient presents to the ED for concern of SOB, this involves a number of treatment options, and is a complaint that carries with it a moderate to high risk of complications and morbidity. A differential diagnosis was considered for the patient's symptoms which is discussed below:   The causes for shortness of breath include but are not limited to Cardiac (AHF, pericardial effusion and tamponade, arrhythmias, ischemia, etc) Respiratory (COPD, asthma, pneumonia, pneumothorax, primary  pulmonary hypertension, PE/VQ mismatch) Hematological (anemia) Neuromuscular (ALS, Guillain-Barr, etc)    Co morbidities: Discussed in HPI   Brief History:  Patient is 62 year old female with past medical history  significant for non-Hodgkin's lymphoma, HTN, DM2, multiple strokes, GBS in 1988 had full recovery, eczema, HLD, obesity, atherosclerosis, PAD  She has had some nausea, diarrhea, vomiting for the past 3 days has had decreased intake and feels weak and dehydrated.  She complains of severe shortness of breath for the past 1 day and when EMS arrived oxygen saturations were 90% at rest.  She is feeling quite short of breath and coughing frequently.  She denies any chest pain apart from when she coughs.  She denies any hemoptysis syncope or near syncope.  She denies any abdominal pain or blood in her stool.    EMR reviewed including pt PMHx, past surgical history and past visits to ER.   See HPI for more details   Lab Tests:   I personally reviewed all laboratory work and imaging. Metabolic panel without any acute abnormality specifically kidney function within normal limits and no significant electrolyte abnormalities. CBC without leukocytosis or significant anemia. There is a mild non-specific transaminitis just marginally over 100 consistent with perhaps a viral illness not consistent with alcohol related transaminitis.  Electrolytes normal, i-STAT VBG with marginally low bicarb and elevated pH consistent with mild respiratory alkalosis likely related to her tachypnea     Imaging Studies:  Abnormal findings. I personally reviewed all imaging studies. Imaging notable for  IMPRESSION:  Worsening bulky bilateral axillary adenopathy concerning for  lymphoma. Borderline sized mediastinal lymph nodes.    Borderline heart size. Scattered ground-glass predominately  perihilar airspace opacities, left greater than right could reflect  early asymmetric edema or infection.    Gallbladder appears distended with possible wall thickening and  internal stones. Consider further evaluation with right upper  quadrant ultrasound.    Aortic atherosclerosis.    Cardiac Monitoring:  The patient was  maintained on a cardiac monitor.  I personally viewed and interpreted the cardiac monitored which showed an underlying rhythm of: Sinus tachycardia EKG non-ischemic   Medicines ordered:  I ordered medication including DuoNeb, 250 mL fluid bolus, Tylenol, Zofran for tachypnea, nausea, discomfort Reevaluation of the patient after these medicines showed that the patient improved I have reviewed the patients home medicines and have made adjustments as needed   Critical Interventions:     Consults/Attending Physician   I requested consultation with Dr. Allena Katz hospitalist service who will admit,  and discussed lab and imaging findings as well as pertinent plan - they recommend:     Reevaluation:  After the interventions noted above I re-evaluated patient and found that they have :improved slight improvement.   Social Determinants of Health:      Problem List / ED Course:  Patient with dyspnea and borderline hypoxia as well as nausea vomiting diarrhea.  Her CT PE study was negative for pulmonary embolism.  No focal pneumonia but there are diffuse groundglass opacities which I suspect may be viral in the setting of nausea vomiting diarrhea and cough.  No leukocytosis and procalcitonin level was ordered.  Admitted to hospitalist service who requested upper quadrant ultrasound be obtained.  Will follow-up on this and discuss with surgery if there is evidence of cholecystitis however her history, physical exam, lab work is not consistent with this.  Overall she seems somewhat improved and although tachypneic is appropriate for floor admission.  BNP is normal and patient  had normal echocardiogram done several months ago.  I do not believe that CHF is contributory to her presentation.   Dispostion:  After consideration of the diagnostic results and the patients response to treatment, I feel that the patent would benefit from admission   Final Clinical Impression(s) / ED Diagnoses Final  diagnoses:  Hypoxia  Dyspnea, unspecified type    Rx / DC Orders ED Discharge Orders     None         Gailen Shelter, [place] 09/24/22 0540    Dione Booze, MD 09/24/22 [phone]

## 2022-09-24 NOTE — Assessment & Plan Note (Signed)
D/d include viral GE. Gastroparesis or reflux related.

## 2022-09-24 NOTE — Assessment & Plan Note (Signed)
SpO2: 94 % O2 Flow Rate (L/min): 3 L/min New onset hypoxia in the setting of shortness of breath I suspect patient may have a component of aspiration pneumonia and will assess for swallowing difficulty.  Patient is essentially bedridden due to her left-sided hemiparesis from her stroke, her slurred speech is baseline however her mouth is dry and due to her positioning I suspect she is at high risk for aspiration which is probably what happened today.  Will start patient on IV antibiotic therapy and monitor oxygenation status and continue with supplemental oxygen and albuterol as needed.

## 2022-09-24 NOTE — Assessment & Plan Note (Signed)
Vitals:   09/24/22 0130 09/24/22 0145 09/24/22 0200 09/24/22 0230  BP: (!) 141/86 138/88 (!) 140/85 (!) 155/91   09/24/22 0245 09/24/22 0300 09/24/22 0315 09/24/22 0330  BP: (!) 142/87 135/83 129/84 (!) 159/92   09/24/22 0415 09/24/22 0430 09/24/22 0445 09/24/22 0500  BP: 118/77 (!) 136/93 (!) 148/89 117/70  Patient's blood pressure intermittently has been soft. I wanted to try a single dose of Lasix for possible volume or pulmonary vascular congestion presentation. Therefore I held patient's home blood pressure medications hydralazine and amlodipine to allow room for diuresis.  Day team MD to resume as deemed appropriate after med reconciliation.

## 2022-09-24 NOTE — H&P (Signed)
History and Physical     Patient: [name] ZOX:[id number] DOB: [birth year] DOA: 09/23/2022 DOS: the patient was seen and examined on 09/24/2022 PCP: [place], [name], MD   Patient coming from:  Home  Chief Complaint: N/V/D. SOB.  HISTORY OF PRESENT ILLNESS: [name] is an 62 y.o. female  Admission for 62 y/o x med problems.  H/O NHL in remission. Nausea / vomiting and dehydration  Generalized weakness.  Last night she developed cough and SOB.  90% o2 at RA. CT shows ground glass infiltrates. ? If this is microscopic colitis , GI consult per am team.  Echo 05/2022: IMPRESSIONS 1. Left ventricular ejection fraction, by estimation, is 60 to 65% . The left ventricle has normal function. The left ventricle has no regional wall motion abnormalities. There is mild concentric left ventricular hypertrophy. Left ventricular diastolic parameters are consistent with Grade I diastolic dysfunction ( impaired relaxation) . 2. Right ventricular systolic function is normal. The right ventricular size is normal. Tricuspid regurgitation signal is inadequate for assessing PA pressure. 3. The mitral valve is grossly normal. No evidence of mitral valve regurgitation. No evidence of mitral stenosis. 4. The aortic valve is tricuspid. Aortic valve regurgitation is not visualized. No aortic stenosis is present. 5. The inferior vena cava is normal in size with greater than 50% respiratory variability, suggesting right atrial pressure of 3 mmHg.  07/2022 Colonoscopy: Impression: - One 3 mm polyp in the ascending colon. - One 3 mm polyp in the descending colon. - Polypoid changes in the sigmoid colon, suspect benign / hyperplastic. - Tortuous colon - restricted in the right colon yet significant looping as well. IC valve reached but could not intubate the cecal cap - Poor anal tone, difficulty with insufflation of left colon / rectum - A few ulcers in the distal rectum. Biopsied to assess for IBD. This is  the cause of her bleeding symptoms - Internal hemorrhoids. - The examination was otherwise normal.  Rectal ulceration could be post infectious vs. stercoral ( no reported constipation - had diarrheal illness recently) vs. prolapse change ( poor anal tone) vs. IBD  >> Patient was seen by GI on 1 May for rectal bleeding and anemia on Bril[facility] colonoscopy report as above.  Colonoscopy showed polyps and tortuous colon and poor anal tone and few ulcers in the distal rectum and pathology showed proctitis but no evidence of granuloma dysplasia or malignancy.   Past Medical History:  Diagnosis Date   Abnormality of gait 05/10/2010   BACK PAIN 11/14/2008   Class 1 obesity due to excess calories with body mass index (BMI) of 31.0 to 31.9 in adult 02/07/2021   DIABETES MELLITUS, TYPE II 07/15/2008   Diplopia 07/15/2008   ECZEMA, ATOPIC 04/03/2009   GERD (gastroesophageal reflux disease)    Guillain-Barre (HCC) 1988   HYPERLIPIDEMIA 03/06/2009   HYPERTENSION 07/15/2008   Stroke (HCC) 2010, 2011   x2    Vertebral artery stenosis    Review of Systems  Respiratory:  Positive for shortness of breath.   Gastrointestinal:  Positive for diarrhea, nausea and vomiting.  Neurological:        Poor historian.    No Known Allergies Past Surgical History:  Procedure Laterality Date   ABDOMINAL HYSTERECTOMY     BIOPSY  07/08/2022   Procedure: BIOPSY;  Surgeon: Benancio Deeds, MD;  Location: [facility] ENDOSCOPY;  Service: Gastroenterology;;   COLONOSCOPY WITH PROPOFOL N/A 07/08/2022   Procedure: COLONOSCOPY WITH PROPOFOL;  Surgeon: Benancio Deeds, MD;  Location: [facility] ENDOSCOPY;  Service: Gastroenterology;  Laterality: N/A;   DILATION AND CURETTAGE OF UTERUS     FOOT SURGERY     IR ANGIO INTRA EXTRACRAN SEL COM CAROTID INNOMINATE UNI L MOD SED  09/17/2021   IR ANGIO INTRA EXTRACRAN SEL INTERNAL CAROTID UNI R MOD SED  09/17/2021   IR ANGIO VERTEBRAL SEL VERTEBRAL UNI R MOD SED  09/17/2021   IR US GUIDE VASC  ACCESS RIGHT  09/17/2021   MEDICATIONS: (Not in an outpatient encounter)    Current Facility-Administered Medications:    0.9 %  sodium chloride infusion, 250 mL, Intravenous, PRN, Gertha Calkin, MD   acetaminophen (TYLENOL) tablet 650 mg, 650 mg, Oral, Q6H PRN **OR** acetaminophen (TYLENOL) suppository 650 mg, 650 mg, Rectal, Q6H PRN, Allena Katz, Eliezer Mccoy, MD   Ampicillin-Sulbactam (UNASYN) 3 g in sodium chloride 0.9 % 100 mL IVPB, 3 g, Intravenous, Q6H, Bryk, Veronda P, RPH   aspirin EC tablet 81 mg, 81 mg, Oral, Daily, Allena Katz, Eliezer Mccoy, MD   atorvastatin (LIPITOR) tablet 80 mg, 80 mg, Oral, Daily, Allena Katz, Eliezer Mccoy, MD   ezetimibe (ZETIA) tablet 10 mg, 10 mg, Oral, Daily, Allena Katz, Eliezer Mccoy, MD   FLUoxetine (PROZAC) capsule 20 mg, 20 mg, Oral, Daily, Palyn Scrima V, MD   heparin injection 5,000 Units, 5,000 Units, Subcutaneous, BID, Sabriya Yono V, MD   insulin aspart (novoLOG) injection 0-9 Units, 0-9 Units, Subcutaneous, TID WC, Judi Jaffe V, MD   pantoprazole (PROTONIX) injection 40 mg, 40 mg, Intravenous, Q12H, Irena Cords V, MD, 40 mg at 09/24/22 0342   sodium chloride flush (NS) 0.9 % injection 3 mL, 3 mL, Intravenous, Q12H, Shaketha Jeon V, MD   sodium chloride flush (NS) 0.9 % injection 3 mL, 3 mL, Intravenous, Q12H, Carnelius Hammitt V, MD   sodium chloride flush (NS) 0.9 % injection 3 mL, 3 mL, Intravenous, PRN, Gertha Calkin, MD   ticagrelor (BRILINTA) tablet 90 mg, 90 mg, Oral, BID, Gertha Calkin, MD   ED Course: Pt in Ed alert awake oriented meeting SIRS criteria.  Difficulty speaking with dry mouth.  Tongue midline exam otherwise Vitals:   09/24/22 0430 09/24/22 0445 09/24/22 0500 09/24/22 0550  BP: (!) 136/93 (!) 148/89 117/70 (!) 137/99  Pulse: (!) 104 (!) 102 (!) 103 (!) 101  Resp: (!) 26 (!) 27 (!) 24 15  Temp:    97.6 F (36.4 C)  TempSrc:      SpO2: 93% 95% 94% 98%  Weight:      Height:       Total I/O In: 250 [IV Piggyback:250] Out: -  SpO2: 98 % O2 Flow Rate (L/min): 2  L/min Blood work in ed shows: CMP shows glucose 141, AST 111, ALT 124 otherwise normal limits. BNP of 94.2. CBC without differential shows stable anemia microcytic with MCV of 78.5 platelets 213 white count of 7.2. A1c of 6.7 on Sep 06, 2022. Results for orders placed or performed during the hospital encounter of 09/23/22 (from the past 72 hour(s))  Comprehensive metabolic panel     Status: Abnormal   Collection Time: 09/23/22 10:37 PM  Result Value Ref Range   Sodium 135 135 - 145 mmol/L   Potassium 4.7 3.5 - 5.1 mmol/L    Comment: HEMOLYSIS AT THIS LEVEL MAY AFFECT RESULT   Chloride 104 98 - 111 mmol/L   CO2 22 22 - 32 mmol/L   Glucose, Bld 141 (H) 70 - 99 mg/dL    Comment: Glucose reference  range applies only to samples taken after fasting for at least 8 hours.   BUN 18 8 - 23 mg/dL   Creatinine, Ser 8.11 0.44 - 1.00 mg/dL   Calcium 9.1 8.9 - 91.4 mg/dL   Total Protein 6.7 6.5 - 8.1 g/dL   Albumin 2.9 (L) 3.5 - 5.0 g/dL   AST 782 (H) 15 - 41 U/L    Comment: HEMOLYSIS AT THIS LEVEL MAY AFFECT RESULT   ALT 124 (H) 0 - 44 U/L    Comment: HEMOLYSIS AT THIS LEVEL MAY AFFECT RESULT   Alkaline Phosphatase 106 38 - 126 U/L   Total Bilirubin 0.9 0.3 - 1.2 mg/dL    Comment: HEMOLYSIS AT THIS LEVEL MAY AFFECT RESULT   GFR, Estimated >60 >60 mL/min    Comment: (NOTE) Calculated using the CKD-EPI Creatinine Equation (2021)    Anion gap 9 5 - 15    Comment: Performed at [facility], [address], [place], [place] [address]  CBC     Status: Abnormal   Collection Time: 09/23/22 10:37 PM  Result Value Ref Range   WBC 7.2 4.0 - 10.5 K/uL   RBC 4.00 3.87 - 5.11 MIL/uL   Hemoglobin 10.1 (L) 12.0 - 15.0 g/dL   HCT 30.8 (L) 65.7 - 84.6 %   MCV 78.5 (L) 80.0 - 100.0 fL   MCH 25.3 (L) 26.0 - 34.0 pg   MCHC 32.2 30.0 - 36.0 g/dL   RDW 96.2 (H) 95.2 - 84.1 %   Platelets 213 150 - 400 K/uL    Comment: REPEATED TO VERIFY   nRBC 0.0 0.0 - 0.2 %    Comment: Performed at [facility], [address] [place], [place] [address]  Brain natriuretic peptide     Status: None   Collection Time: 09/23/22 10:37 PM  Result Value Ref Range   B Natriuretic Peptide 94.2 0.0 - 100.0 pg/mL    Comment: Performed at [facility], [address], [place], [place] [address]  Resp panel by RT-PCR (RSV, Flu A&B, Covid) Anterior Nasal Swab     Status: None   Collection Time: 09/23/22 11:07 PM   Specimen: Anterior Nasal Swab  Result Value Ref Range   SARS Coronavirus 2 by RT PCR NEGATIVE NEGATIVE   Influenza A by PCR NEGATIVE NEGATIVE   Influenza B by PCR NEGATIVE NEGATIVE    Comment: (NOTE) The Xpert Xpress SARS-CoV-2/FLU/RSV plus assay is intended as an aid in the diagnosis of influenza from Nasopharyngeal swab specimens and should not be used as a sole basis for treatment. Nasal washings and aspirates are unacceptable for Xpert Xpress SARS-CoV-2/FLU/RSV testing.  Fact Sheet for Patients: BloggerCourse.com  Fact Sheet for Healthcare Providers: SeriousBroker.it  This test is not yet approved or cleared by the [place] FDA and has been authorized for detection and/or diagnosis of SARS-CoV-2 by FDA under an Emergency Use Authorization (EUA). This EUA will remain in effect (meaning this test can be used) for the duration of the COVID-19 declaration under Section 564(b)(1) of the Act, 21 U.S.C. section 360bbb-3(b)(1), unless the authorization is terminated or revoked.     Resp Syncytial Virus by PCR NEGATIVE NEGATIVE    Comment: (NOTE) Fact Sheet for Patients: BloggerCourse.com  Fact Sheet for Healthcare Providers: SeriousBroker.it  This test is not yet approved or cleared by the [place] FDA and has been authorized for detection and/or diagnosis of SARS-CoV-2 by FDA under an Emergency Use Authorization (EUA). This  EUA will remain in effect (meaning this  test can be used) for the duration of the COVID-19 declaration under Section 564(b)(1) of the Act, 21 U.S.C. section 360bbb-3(b)(1), unless the authorization is terminated or revoked.  Performed at [facility], [address], [place], [place] [address]   Lactic acid, plasma     Status: None   Collection Time: 09/24/22 12:17 AM  Result Value Ref Range   Lactic Acid, Venous 0.7 0.5 - 1.9 mmol/L    Comment: Performed at [facility], [address], [place], [place] [address]  I-Stat venous blood gas, [facility], [facility], [facility])     Status: Abnormal   Collection Time: 09/24/22 12:28 AM  Result Value Ref Range   pH, Ven 7.518 (H) 7.25 - 7.43   pCO2, Ven 30.5 (L) 44 - 60 mmHg   pO2, Ven 70 (H) 32 - 45 mmHg   Bicarbonate 24.8 20.0 - 28.0 mmol/L   TCO2 26 22 - 32 mmol/L   O2 Saturation 96 %   Acid-Base Excess 2.0 0.0 - 2.0 mmol/L   Sodium 137 135 - 145 mmol/L   Potassium 3.7 3.5 - 5.1 mmol/L   Calcium, Ion 1.20 1.15 - 1.40 mmol/L   HCT 31.0 (L) 36.0 - 46.0 %   Hemoglobin 10.5 (L) 12.0 - 15.0 g/dL   Patient temperature 09.8 C    Sample type VENOUS   Lactic acid, plasma     Status: None   Collection Time: 09/24/22  2:15 AM  Result Value Ref Range   Lactic Acid, Venous 0.7 0.5 - 1.9 mmol/L    Comment: Performed at [facility], [address] [place], [place] [address]  Troponin I (High Sensitivity)     Status: None   Collection Time: 09/24/22  2:40 AM  Result Value Ref Range   Troponin I (High Sensitivity) 11 <18 ng/L    Comment: (NOTE) Elevated high sensitivity troponin I (hsTnI) values and significant  changes across serial measurements may suggest ACS but many other  chronic and acute conditions are known to elevate hsTnI results.  Refer to the "Links" section for chest pain algorithms and additional  guidance. Performed at [facility], [address], [place], [place] [address]   Procalcitonin     Status: None   Collection Time: 09/24/22  3:41 AM  Result  Value Ref Range   Procalcitonin 0.33 ng/mL    Comment:        Interpretation: PCT (Procalcitonin) <= 0.5 ng/mL: Systemic infection (sepsis) is not likely. Local bacterial infection is possible. (NOTE)       Sepsis PCT Algorithm           Lower Respiratory Tract                                      Infection PCT Algorithm    ----------------------------     ----------------------------         PCT < 0.25 ng/mL                PCT < 0.10 ng/mL          Strongly encourage             Strongly discourage   discontinuation of antibiotics    initiation of antibiotics    ----------------------------     -----------------------------       PCT  0.25 - 0.50 ng/mL            PCT 0.10 - 0.25 ng/mL               OR       >80% decrease in PCT            Discourage initiation of                                            antibiotics      Encourage discontinuation           of antibiotics    ----------------------------     -----------------------------         PCT >= 0.50 ng/mL              PCT 0.26 - 0.50 ng/mL               AND        <80% decrease in PCT             Encourage initiation of                                             antibiotics       Encourage continuation           of antibiotics    ----------------------------     -----------------------------        PCT >= 0.50 ng/mL                  PCT > 0.50 ng/mL               AND         increase in PCT                  Strongly encourage                                      initiation of antibiotics    Strongly encourage escalation           of antibiotics                                     -----------------------------                                           PCT <= 0.25 ng/mL                                                 OR                                        > 80% decrease in PCT  Discontinue / Do not initiate                                             antibiotics  Performed at  [facility], [address] [place], [place] [address]   CBC     Status: Abnormal   Collection Time: 09/24/22  4:06 AM  Result Value Ref Range   WBC 5.7 4.0 - 10.5 K/uL   RBC 3.97 3.87 - 5.11 MIL/uL   Hemoglobin 10.2 (L) 12.0 - 15.0 g/dL   HCT 47.8 (L) 29.5 - 62.1 %   MCV 78.3 (L) 80.0 - 100.0 fL   MCH 25.7 (L) 26.0 - 34.0 pg   MCHC 32.8 30.0 - 36.0 g/dL   RDW 30.8 (H) 65.7 - 84.6 %   Platelets 300 150 - 400 K/uL   nRBC 0.0 0.0 - 0.2 %    Comment: Performed at [facility], [address] [place], [place] [address]    Lab Results  Component Value Date   CREATININE 0.96 09/23/2022   CREATININE 0.80 09/12/2022   CREATININE 0.73 08/26/2022      Latest Ref Rng & Units 09/24/2022   12:28 AM 09/23/2022   10:37 PM 09/12/2022   11:34 AM  CMP  Glucose 70 - 99 mg/dL  284  132   BUN 8 - 23 mg/dL  18  14   Creatinine 4.40 - 1.00 mg/dL  1.02  7.25   Sodium 366 - 145 mmol/L 137  135  138   Potassium 3.5 - 5.1 mmol/L 3.7  4.7  3.5   Chloride 98 - 111 mmol/L  104  107   CO2 22 - 32 mmol/L  22  21   Calcium 8.9 - 10.3 mg/dL  9.1  9.6   Total Protein 6.5 - 8.1 g/dL  6.7  7.2   Total Bilirubin 0.3 - 1.2 mg/dL  0.9  0.4   Alkaline Phos 38 - 126 U/L  106  102   AST 15 - 41 U/L  101  31   ALT 0 - 44 U/L  124  46     Unresulted Labs (From admission, onward)     Start     Ordered   09/24/22 0555  Gastrointestinal Panel by PCR , Stool  (Gastrointestinal Panel by PCR, Stool                                                                                                                                                     **Does Not include CLOSTRIDIUM DIFFICILE testing. **If CDIFF testing is needed, place order from the "C Difficile Testing" order set.**)  Once,   R  09/24/22 0555   09/24/22 0515  Comprehensive metabolic panel  Once,   R        09/24/22 0515   09/24/22 0515  Magnesium  Once,   R        09/24/22 0515           Pt has received : Orders Placed This  Encounter  Procedures   Resp panel by RT-PCR (RSV, Flu A&B, Covid) Anterior Nasal Swab    Standing Status:   Standing    Number of Occurrences:   1   Gastrointestinal Panel by PCR , Stool    Standing Status:   Standing    Number of Occurrences:   1   DG Chest 2 View    Standing Status:   Standing    Number of Occurrences:   1    Order Specific Question:   Reason for Exam (SYMPTOM  OR DIAGNOSIS REQUIRED)    Answer:   SOB, rhonchi L>R , hypoxic   CT Angio Chest PE W/Cm &/Or Wo Cm    Standing Status:   Standing    Number of Occurrences:   1    Order Specific Question:   Bypass Screening Labs?    Answer:   I, as the ordering provider, have weighed the risks vs. benefits for this patient and concluded this exam meets the requirements for medical necessity to bypass lab work needed prior to CT.    Order Specific Question:   Does the patient have a contrast media/X-ray dye allergy?    Answer:   No    Order Specific Question:   If indicated for the ordered procedure, I authorize the administration of contrast media per Radiology protocol    Answer:   Yes    Order Specific Question:   Radiology Contrast Protocol - do NOT remove file path    Answer:   [email or web address]   US Abdomen Limited RUQ (LIVER/GB)    Standing Status:   Standing    Number of Occurrences:   1    Order Specific Question:   Symptom/Reason for Exam    Answer:   RUQ abdominal pain [251470]   Comprehensive metabolic panel    Standing Status:   Standing    Number of Occurrences:   1   CBC    For temp > 100.7    Standing Status:   Standing    Number of Occurrences:   1   Brain natriuretic peptide    Standing Status:   Standing    Number of Occurrences:   1   Lactic acid, plasma    Standing Status:   Standing    Number of Occurrences:   2   Procalcitonin    Standing Status:   Standing    Number of Occurrences:   1   CBC    Standing Status:   Standing    Number of Occurrences:    1   Comprehensive metabolic panel    Standing Status:   Standing    Number of Occurrences:   1   Magnesium    Standing Status:   Standing    Number of Occurrences:   1   Diet NPO time specified    Standing Status:   Standing    Number of Occurrences:   1   Vital signs    Standing Status:   Standing    Number of Occurrences:   1   ED Cardiac monitoring    Standing Status:  Standing    Number of Occurrences:   1   Check Peak Flow    Standing Status:   Standing    Number of Occurrences:   1   Initiate Carrier Fluid Protocol    Standing Status:   Standing    Number of Occurrences:   1   Strict intake and output    Standing Status:   Standing    Number of Occurrences:   1   Maintain IV access    Standing Status:   Standing    Number of Occurrences:   1   Vital signs    Standing Status:   Standing    Number of Occurrences:   1   Notify physician (specify)    Standing Status:   Standing    Number of Occurrences:   20    Order Specific Question:   Notify Physician    Answer:   for pulse less than 55 or greater than 120    Order Specific Question:   Notify Physician    Answer:   for respiratory rate less than 12 or greater than 25    Order Specific Question:   Notify Physician    Answer:   for temperature greater than 100.5 F    Order Specific Question:   Notify Physician    Answer:   for urinary output less than 30 mL/hr for four hours    Order Specific Question:   Notify Physician    Answer:   for systolic BP less than 90 or greater than 160, diastolic BP less than 60 or greater than 100    Order Specific Question:   Notify Physician    Answer:   for new hypoxia w/ oxygen saturations < 88%   Progressive Mobility Protocol: No Restrictions    Standing Status:   Standing    Number of Occurrences:   1   Daily weights    Standing Status:   Standing    Number of Occurrences:   1   Intake and Output    Standing Status:   Standing    Number of Occurrences:   1   Do not place  and if present remove PureWick    Standing Status:   Standing    Number of Occurrences:   1   Initiate Oral Care Protocol    Standing Status:   Standing    Number of Occurrences:   1   Initiate Carrier Fluid Protocol    Standing Status:   Standing    Number of Occurrences:   1   RN may order General Admission PRN Orders utilizing "General Admission PRN medications" (through manage orders) for the following patient needs: allergy symptoms (Claritin), cold sores (Carmex), cough (Robitussin DM), eye irritation (Liquifilm Tears), hemorrhoids (Tucks), indigestion (Maalox), minor skin irritation (Hydrocortisone Cream), muscle pain Romeo Apple Gay), nose irritation (saline nasal spray) and sore throat (Chloraseptic spray).    Standing Status:   Standing    Number of Occurrences:   [id number]   Cardiac Monitoring Continuous x 48 hours Indications for use: Other; Other indications for use: SOB    Standing Status:   Standing    Number of Occurrences:   1    Order Specific Question:   Indications for use:    Answer:   Other    Order Specific Question:   Other indications for use:    Answer:   SOB   Swallow screen    Standing Status:   Standing  Number of Occurrences:   1   Apply Diabetes Mellitus Care Plan    Standing Status:   Standing    Number of Occurrences:   1   STAT CBG when hypoglycemia is suspected. If treated, recheck every 15 minutes after each treatment until CBG >/= 70 mg/dl    Standing Status:   Standing    Number of Occurrences:   1   Refer to Hypoglycemia Protocol Sidebar Report for treatment of CBG < 70 mg/dl    Standing Status:   Standing    Number of Occurrences:   1   No HS correction Insulin    Standing Status:   Standing    Number of Occurrences:   1   Full code    Standing Status:   Standing    Number of Occurrences:   1    Order Specific Question:   By:    Answer:   Other   Consult to hospitalist    Standing Status:   Standing    Number of Occurrences:   1    Order  Specific Question:   Place call to:    Answer:   [employer] Question:   Reason for Consult    Answer:   Admit   ampicillin-sulbactam (UNASYN) per pharmacy consult    Standing Status:   Standing    Number of Occurrences:   1    Order Specific Question:   Antibiotic Indication:    Answer:   Aspiration Pneumonia   Enteric precautions (UV disinfection) C difficile, Norovirus    Standing Status:   Standing    Number of Occurrences:   1   Pulse oximetry check with vital signs    Standing Status:   Standing    Number of Occurrences:   1   Oxygen therapy Mode or (Route): Nasal cannula; Liters Per Minute: 2; Keep 02 saturation: greater than 92 %    Standing Status:   Standing    Number of Occurrences:   20    Order Specific Question:   Mode or (Route)    Answer:   Nasal cannula    Order Specific Question:   Liters Per Minute    Answer:   2    Order Specific Question:   Keep 02 saturation    Answer:   greater than 92 %   I-Stat venous blood gas, ([facility] ED, [facility], [facility])    Standing Status:   Standing    Number of Occurrences:   1   ED EKG    Standing Status:   Standing    Number of Occurrences:   1    Order Specific Question:   Reason for Exam    Answer:   Shortness of breath   EKG 12-Lead    Standing Status:   Standing    Number of Occurrences:   1   Saline lock IV    Standing Status:   Standing    Number of Occurrences:   1   Place in observation (patient's expected length of stay will be less than 2 midnights)    Standing Status:   Standing    Number of Occurrences:   1    Order Specific Question:   Hospital Area    Answer:   [facility] [[id number]]    Order Specific Question:   Level of Care    Answer:   Telemetry Medical [104]    Order Specific Question:  May place patient in observation at [facility] or [facility] if equivalent level of care is available:    Answer:   Yes    Order Specific Question:   Covid Evaluation    Answer:    Asymptomatic - no recent exposure (last 10 days) testing not required    Order Specific Question:   Diagnosis    Answer:   SOB (shortness of breath) [161096]    Order Specific Question:   Admitting Physician    Answer:   Darrold Junker    Order Specific Question:   Attending Physician    Answer:   Darrold Junker    Meds ordered this encounter  Medications   acetaminophen (TYLENOL) tablet 1,000 mg   ondansetron (ZOFRAN) injection 4 mg   sodium chloride 0.9 % bolus 250 mL   ipratropium-albuterol (DUONEB) 0.5-2.5 (3) MG/3ML nebulizer solution 3 mL   iohexol (OMNIPAQUE) 350 MG/ML injection 75 mL   pantoprazole (PROTONIX) injection 40 mg   furosemide (LASIX) injection 20 mg   aspirin EC tablet 81 mg    Swallow whole.     atorvastatin (LIPITOR) tablet 80 mg   FLUoxetine (PROZAC) capsule 20 mg   ezetimibe (ZETIA) tablet 10 mg   ticagrelor (BRILINTA) tablet 90 mg   heparin injection 5,000 Units   sodium chloride flush (NS) 0.9 % injection 3 mL   OR Linked Order Group    acetaminophen (TYLENOL) tablet 650 mg    acetaminophen (TYLENOL) suppository 650 mg   sodium chloride flush (NS) 0.9 % injection 3 mL   sodium chloride flush (NS) 0.9 % injection 3 mL   0.9 %  sodium chloride infusion   Ampicillin-Sulbactam (UNASYN) 3 g in sodium chloride 0.9 % 100 mL IVPB    Order Specific Question:   Antibiotic Indication:    Answer:   Aspiration Pneumonia   insulin aspart (novoLOG) injection 0-9 Units    Order Specific Question:   Correction coverage:    Answer:   Sensitive (thin, NPO, renal)    Order Specific Question:   CBG < 70:    Answer:   Implement Hypoglycemia Standing Orders and refer to Hypoglycemia Standing Orders sidebar report    Order Specific Question:   CBG 70 - 120:    Answer:   0 units    Order Specific Question:   CBG 121 - 150:    Answer:   1 unit    Order Specific Question:   CBG 151 - 200:    Answer:   2 units    Order Specific Question:   CBG 201 - 250:     Answer:   3 units    Order Specific Question:   CBG 251 - 300:    Answer:   5 units    Order Specific Question:   CBG 301 - 350:    Answer:   7 units    Order Specific Question:   CBG 351 - 400    Answer:   9 units    Order Specific Question:   CBG > 400    Answer:   call MD and obtain STAT lab verification    Admission Imaging : CT Angio Chest PE W/Cm &/Or Wo Cm  Result Date: 09/24/2022 CLINICAL DATA:  Pulmonary embolism (PE) suspected, high prob hypoxic, tachypneic pt -- some rhonchi but concern for PE given her sx and clear CXR EXAM: CT ANGIOGRAPHY CHEST WITH CONTRAST TECHNIQUE: Multidetector CT imaging of the chest  was performed using the standard protocol during bolus administration of intravenous contrast. Multiplanar CT image reconstructions and MIPs were obtained to evaluate the vascular anatomy. RADIATION DOSE REDUCTION: This exam was performed according to the departmental dose-optimization program which includes automated exposure control, adjustment of the mA and/or kV according to patient size and/or use of iterative reconstruction technique. CONTRAST:  75mL OMNIPAQUE IOHEXOL 350 MG/ML SOLN COMPARISON:  05/29/2022. FINDINGS: Cardiovascular: No filling defects in the pulmonary arteries to suggest pulmonary emboli. Heart borderline in size. Aorta normal caliber. Scattered aortic calcifications. Retroesophageal right subclavian artery. Mediastinum/Nodes: Fall bulky mediastinal adenopathy again noted, worsening since prior study. Index left axillary lymph node has a short axis diameter of 2.9 cm compared to 2.5 cm previously. Index right hilar lymph node has a short axis diameter of 2.8 cm compared with 1.6 cm previously. Increasing mediastinal lymph node size. Subcarinal lymph node has a short axis diameter of 15 mm compared to 10 mm previously. Prevascular lymph node has a short axis diameter of 8 mm compared to 5 mm previously. Trachea and esophagus are unremarkable. Thyroid unremarkable.  Lungs/Pleura: Ground-glass airspace opacities in the perihilar regions, left slightly greater than right. Favor asymmetric edema although infection cannot be excluded. No effusions. Upper Abdomen: Gallbladder appears distended with possible wall thickening and stones within the gallbladder. Low-density throughout the liver suggesting fatty infiltration. Musculoskeletal: Chest wall soft tissues are unremarkable. No acute bony abnormality. Review of the MIP images confirms the above findings. IMPRESSION: Worsening bulky bilateral axillary adenopathy concerning for lymphoma. Borderline sized mediastinal lymph nodes. Borderline heart size. Scattered ground-glass predominately perihilar airspace opacities, left greater than right could reflect early asymmetric edema or infection. Gallbladder appears distended with possible wall thickening and internal stones. Consider further evaluation with right upper quadrant ultrasound. Aortic atherosclerosis. Electronically Signed   By: Charlett Nose M.D.   On: 09/24/2022 01:19   DG Chest 2 View  Result Date: 09/23/2022 CLINICAL DATA:  Shortness of breath. Left greater than right rhonchi. Hypoxia. EXAM: CHEST - 2 VIEW COMPARISON:  Radiograph 08/26/2022. CT 05/29/2022 FINDINGS: Lung volumes are low. Heart size upper normal. Stable mediastinal contours. Atelectasis/scarring in the left mid lung. No acute airspace disease. Normal pulmonary vasculature. No pleural fluid or pneumothorax no acute osseous abnormalities are seen. IMPRESSION: Low lung volumes with atelectasis/scarring in the left mid lung. Electronically Signed   By: Narda Rutherford M.D.   On: 09/23/2022 23:05    Physical Examination: Vitals:   09/24/22 0430 09/24/22 0445 09/24/22 0500 09/24/22 0550  BP: (!) 136/93 (!) 148/89 117/70 (!) 137/99  Pulse: (!) 104 (!) 102 (!) 103 (!) 101  Temp:    97.6 F (36.4 C)  Resp: (!) 26 (!) 27 (!) 24 15  Height:      Weight:      SpO2: 93% 95% 94% 98%  TempSrc:      BMI  (Calculated):       Physical Exam Vitals and nursing note reviewed.  Constitutional:      General: She is not in acute distress. HENT:     Head: Normocephalic and atraumatic.     Right Ear: Hearing normal.     Left Ear: Hearing normal.     Nose: Nose normal. No nasal deformity.     Mouth/Throat:     Lips: Pink.     Tongue: No lesions.     Pharynx: Oropharynx is clear.  Eyes:     General: Lids are normal.     Extraocular Movements:  Extraocular movements intact.  Cardiovascular:     Rate and Rhythm: Normal rate and regular rhythm.     Heart sounds: Normal heart sounds.  Pulmonary:     Effort: Pulmonary effort is normal.     Breath sounds: Examination of the right-middle field reveals rales. Examination of the left-middle field reveals rales. Examination of the right-lower field reveals rales. Examination of the left-lower field reveals rales. Rales present.  Abdominal:     General: Bowel sounds are normal. There is no distension.     Palpations: Abdomen is soft. There is no mass.     Tenderness: There is no abdominal tenderness.  Musculoskeletal:     Right lower leg: No edema.     Left lower leg: No edema.  Skin:    General: Skin is warm.  Neurological:     General: No focal deficit present.     Mental Status: She is alert and oriented to person, place, and time.     Cranial Nerves: Cranial nerves 2-12 are intact.     Motor: Weakness present.     Comments: Left-sided hemiparesis at baseline affecting upper and lower extremity.  Psychiatric:        Attention and Perception: Attention normal.        Mood and Affect: Mood normal.        Speech: Speech normal.        Behavior: Behavior normal. Behavior is cooperative.     Assessment and Plan: * Shortness of breath Suspect aspiration PNA from her vomiting.  Will start unasyn. Patient shortness of breath may also be from her lymphadenopathy. CTA shows: Worsening bulky bilateral axillary adenopathy concerning for lymphoma.  Borderline sized mediastinal lymph nodes. Borderline heart size. Scattered ground-glass predominately perihilar airspace opacities, left greater than right could reflect early asymmetric edema or infection. Gallbladder appears distended with possible wall thickening and internal stones. Consider further evaluation with right upper quadrant ultrasound. Aortic atherosclerosis.   Acute respiratory failure with hypoxia (HCC) SpO2: 94 % O2 Flow Rate (L/min): 3 L/min New onset hypoxia in the setting of shortness of breath I suspect patient may have a component of aspiration pneumonia and will assess for swallowing difficulty.  Patient is essentially bedridden due to her left-sided hemiparesis from her stroke, her slurred speech is baseline however her mouth is dry and due to her positioning I suspect she is at high risk for aspiration which is probably what happened today.  Will start patient on IV antibiotic therapy and monitor oxygenation status and continue with supplemental oxygen and albuterol as needed.  Nausea vomiting and diarrhea D/d include viral GE. Gastroparesis or reflux related.    Hemiparesis affecting left side as late effect of stroke (HCC) At baseline patient has hemiparesis on the left side of her arm and leg. Will continue patient on aspirin 81, Lipitor, Zetia.  Diabetes mellitus (HCC) Swallow eval / Glycemic protocol.  Currently we will hold patient's Glargine and NovoLog.  Essential hypertension Vitals:   09/24/22 0130 09/24/22 0145 09/24/22 0200 09/24/22 0230  BP: (!) 141/86 138/88 (!) 140/85 (!) 155/91   09/24/22 0245 09/24/22 0300 09/24/22 0315 09/24/22 0330  BP: (!) 142/87 135/83 129/84 (!) 159/92   09/24/22 0415 09/24/22 0430 09/24/22 0445 09/24/22 0500  BP: 118/77 (!) 136/93 (!) 148/89 117/70  Patient's blood pressure intermittently has been soft. I wanted to try a single dose of Lasix for possible volume or pulmonary vascular congestion presentation. Therefore I  held patient's home blood pressure medications  hydralazine and amlodipine to allow room for diuresis.  Day team MD to resume as deemed appropriate after med reconciliation.   GERD (gastroesophageal reflux disease) Patient has a history of GERD and history of rectal bleeding and GI bleed. No report of any melena of hematochezia. CBC    Component Value Date/Time   WBC 5.7 09/24/2022 0406   RBC 3.97 09/24/2022 0406   HGB 10.2 (L) 09/24/2022 0406   HGB 11.3 (L) 09/12/2022 1134   HCT 31.1 (L) 09/24/2022 0406   PLT 300 09/24/2022 0406   PLT 329 09/12/2022 1134   MCV 78.3 (L) 09/24/2022 0406   MCH 25.7 (L) 09/24/2022 0406   MCHC 32.8 09/24/2022 0406   RDW 15.9 (H) 09/24/2022 0406   LYMPHSABS 2.7 09/12/2022 1134   MONOABS 0.9 09/12/2022 1134   EOSABS 0.2 09/12/2022 1134   BASOSABS 0.0 09/12/2022 1134      Latest Ref Rng & Units 09/24/2022    4:06 AM 09/24/2022   12:28 AM 09/23/2022   10:37 PM  CBC  WBC 4.0 - 10.5 K/uL 5.7   7.2   Hemoglobin 12.0 - 15.0 g/dL 91.4  78.2  95.6   Hematocrit 36.0 - 46.0 % 31.1  31.0  31.4   Platelets 150 - 400 K/uL 300   213   Stable we will follow.  Iv ppi.  LFT elevation    Latest Ref Rng & Units 09/23/2022   10:37 PM 09/12/2022   11:34 AM 08/21/2022    7:03 AM  Hepatic Function  Total Protein 6.5 - 8.1 g/dL 6.7  7.2  6.9   Albumin 3.5 - 5.0 g/dL 2.9  3.6  3.0   AST 15 - 41 U/L 101  31  32   ALT 0 - 44 U/L 124  46  36   Alk Phosphatase 38 - 126 U/L 106  102  91   Total Bilirubin 0.3 - 1.2 mg/dL 0.9  0.4  0.6    Differentials include passive hepatic congestion, viral illness, abnormal gallbladder finding on CT requested ultrasound of the right upper quadrant.    Other orders -     Vital signs; Standing -     ED EKG; Standing -     EKG 12-Lead; Standing -     I-Stat venous blood gas, ED; Standing -     ED Cardiac monitoring; Standing -     Check Peak Flow; Standing -     Initiate Carrier Fluid Protocol; Standing -     Resp panel by RT-PCR  (RSV, Flu A&B, Covid); Standing -     Comprehensive metabolic panel; Standing -     CBC; Standing -     Saline lock IV; Standing -     DG Chest 2 View; Standing -     Acetaminophen -     Ondansetron HCl -     Brain natriuretic peptide; Standing -     sodium chloride -     Lactic acid, plasma; Standing -     CT Angio Chest Pulmonary Embolism (PE) W or WO Contrast; Standing -     Ipratropium-Albuterol -     Iohexol -     Consult to hospitalist; Standing -     US ABDOMEN LIMITED RUQ (LIVER/GB); Standing -     Troponin I (High Sensitivity); Standing -     Pantoprazole Sodium -     Procalcitonin; Standing -     Furosemide -     Strict intake and  output; Standing -     Aspirin -     Atorvastatin Calcium -     FLUoxetine HCl -     Ezetimibe -     Ticagrelor -     Heparin Sodium (Porcine) -     On Pharmacologic VTE Prophylaxis  (no mechanical VTE prophylaxis ordered); Standing -     Place in observation (patient's expected length of stay will be less than 2 midnights); Standing -     Maintain IV access; Standing -     sodium chloride flush -     Vital signs; Standing -     Pulse oximetry (single); Standing -     Notify physician (specify); Standing -     Progressive Mobility Protocol: No Restrictions; Standing -     Weigh patient; Standing -     Intake and output; Standing -     Do not place and if present remove PureWick; Standing -     Initiate Oral Care Protocol; Standing -     Initiate Carrier Fluid Protocol; Standing -     Oxygen therapy; Standing -     CBC; Standing -     Acetaminophen -     Acetaminophen -     RN may order General Admission PRN Orders utilizing "General Admission PRN medications" (through manage orders) for the following patient needs:; Standing -     Full code; Standing -     Cardiac Monitoring; Standing -     Diet NPO time specified; Standing -     sodium chloride flush -     sodium chloride flush -     Sodium Chloride -     Comprehensive metabolic  panel; Standing -     Magnesium; Standing -     ampicillin-sulbactam (UNASYN) per pharmacy consult; Standing -     Swallow screen; Standing    DVT prophylaxis:  Heparin    Code Status:  Full code      08/21/2022    6:13 AM  Advanced Directives  Does Patient Have a Medical Advance Directive? No   Family Communication:  None  Emergency Contact: Contact Information     Name Relation Home Work [place] Sister [phone]  [phone]   Dionisio David Niece   [phone]      Disposition Plan:  TBD.  Consults: None.  Admission status: observation    Unit / Expected LOS: Med tele/ 2 days.  Gertha Calkin MD [facility]  6 PM- 2 AM. [phone] Please use [email or web address] OR call [facility] Admits & Consults @ [phone] to contact current Assigned [facility] Attending/Consulting MD for this patient.

## 2022-09-24 NOTE — Evaluation (Signed)
Clinical/Bedside Swallow Evaluation Patient Details  Name: [name] MRN: [id number] Date of Birth: [birth year]  Today's Date: 09/24/2022 Time: SLP Start Time (ACUTE ONLY): 1520 SLP Stop Time (ACUTE ONLY): 1550 SLP Time Calculation (min) (ACUTE ONLY): 30 min  Past Medical History:  Past Medical History:  Diagnosis Date   Abnormality of gait 05/10/2010   BACK PAIN 11/14/2008   Class 1 obesity due to excess calories with body mass index (BMI) of 31.0 to 31.9 in adult 02/07/2021   DIABETES MELLITUS, TYPE II 07/15/2008   Diplopia 07/15/2008   ECZEMA, ATOPIC 04/03/2009   GERD (gastroesophageal reflux disease)    Guillain-Barre (HCC) 1988   HYPERLIPIDEMIA 03/06/2009   HYPERTENSION 07/15/2008   Stroke (HCC) 2010, 2011   x2    Vertebral artery stenosis    Past Surgical History:  Past Surgical History:  Procedure Laterality Date   ABDOMINAL HYSTERECTOMY     BIOPSY  07/08/2022   Procedure: BIOPSY;  Surgeon: Benancio Deeds, MD;  Location: [facility];  Service: Gastroenterology;;   COLONOSCOPY WITH PROPOFOL N/A 07/08/2022   Procedure: COLONOSCOPY WITH PROPOFOL;  Surgeon: Benancio Deeds, MD;  Location: [facility];  Service: Gastroenterology;  Laterality: N/A;   DILATION AND CURETTAGE OF UTERUS     FOOT SURGERY     IR ANGIO INTRA EXTRACRAN SEL COM CAROTID INNOMINATE UNI L MOD SED  09/17/2021   IR ANGIO INTRA EXTRACRAN SEL INTERNAL CAROTID UNI R MOD SED  09/17/2021   IR ANGIO VERTEBRAL SEL VERTEBRAL UNI R MOD SED  09/17/2021   IR US GUIDE VASC ACCESS RIGHT  09/17/2021   HPI:  [name] is an 62 y.o. female   Admission for 62 y/o x med problems.   H/O NHL in remission.  Nausea / vomiting and dehydration   Generalized weakness.   Last night she developed cough and SOB.   90% o2 at RA.  CT chest revealed  Scattered ground-glass predominately  perihilar airspace opacities, left greater than right could reflect  early asymmetric edema or infection.  ST    Assessment / Plan /  Recommendation  Clinical Impression  Pt seen for clinical swallowing evaluation with prolonged oral manipulation observed with delayed cough and audible swallow noted during puree/thin via larger boluses. Impaired mastication seen with solids.  Pt demonstrated weak cough, low vocal intensity and dysarthric speech during communicative interactions.  She also informed ST she was seeing a speech pathologist at [facility] prior to admission and was cleared for a regular diet.  Pt now with oxygen requirement of 2L, deconditioned and frail presentation during CSE.  Pt encouraged pt to use effortful swallow, small bites/sips and slow rate during consumption of consistencies to increase swallow efficiency during examination.  Recommend initiating a Dysphagia 1(puree)/thin liquids with swallowing/aspiration precautions in place and posted in the room for energy conservation with objective assessment to be completed to assess swallow function and determine safest diet.  Silent aspiration cannot be ruled out without objective measure.  ST will f/u for completion of MBS and dysphagia tx/management during acute stay.  Thank you for this consult. SLP Visit Diagnosis: Dysphagia, oropharyngeal phase (R13.12)    Aspiration Risk  Mild aspiration risk;Moderate aspiration risk    Diet Recommendation   Dysphagia 1(puree)/thin liquids   Medication Administration: Crushed with puree    Other  Recommendations Oral Care Recommendations: Oral care BID;Staff/trained caregiver to provide oral care    Recommendations for follow up therapy are one component of a multi-disciplinary discharge planning process, led  by the attending physician.  Recommendations may be updated based on patient status, additional functional criteria and insurance authorization.  Follow up Recommendations Skilled nursing-short term rehab (<3 hours/day)      Assistance Recommended at Discharge    Functional Status Assessment Patient has had a recent decline  in their functional status and demonstrates the ability to make significant improvements in function in a reasonable and predictable amount of time.  Frequency and Duration            Prognosis Prognosis for improved oropharyngeal function: Good      Swallow Study   General Date of Onset: 09/24/22 HPI: [name] is an 62 y.o. female   Admission for 62 y/o x med problems.   H/O NHL in remission.  Nausea / vomiting and dehydration   Generalized weakness.   Last night she developed cough and SOB.   90% o2 at RA.  CT chest revealed  Scattered ground-glass predominately  perihilar airspace opacities, left greater than right could reflect  early asymmetric edema or infection.  ST Type of Study: Bedside Swallow Evaluation Previous Swallow Assessment: BSE 08/22/22 with regular/thin and normal oropharyngeal swallow observed Diet Prior to this Study: Dysphagia 3 (mechanical soft);Thin liquids (Level 0) Temperature Spikes Noted: No Respiratory Status: Nasal cannula History of Recent Intubation: No Behavior/Cognition: Alert;Cooperative;Other (Comment) (dysarthric/deconditioned) Oral Cavity Assessment: Dry Oral Care Completed by SLP: Recent completion by staff Oral Cavity - Dentition: Missing dentition Self-Feeding Abilities: Needs assist Patient Positioning: Upright in bed Baseline Vocal Quality: Low vocal intensity;Hoarse Volitional Cough: Weak Volitional Swallow: Able to elicit    Oral/Motor/Sensory Function Overall Oral Motor/Sensory Function: Mild impairment Lingual Symmetry: Abnormal symmetry left Lingual Strength: Reduced   Ice Chips Ice chips: Impaired Presentation: Spoon Oral Phase Impairments: Reduced lingual movement/coordination Oral Phase Functional Implications: Prolonged oral transit   Thin Liquid Thin Liquid: Impaired Presentation: Cup Oral Phase Functional Implications: Prolonged oral transit Pharyngeal  Phase Impairments: Cough - Delayed    Nectar Thick Nectar Thick  Liquid: Not tested   Honey Thick Honey Thick Liquid: Not tested   Puree Puree: Impaired Presentation: Spoon Oral Phase Impairments: Reduced lingual movement/coordination Oral Phase Functional Implications: Prolonged oral transit Pharyngeal Phase Impairments: Multiple swallows;Cough - Delayed   Solid     Solid: Impaired Presentation: Spoon Oral Phase Impairments: Impaired mastication;Reduced lingual movement/coordination Oral Phase Functional Implications: Impaired mastication;Prolonged oral transit Pharyngeal Phase Impairments: Multiple swallows      Pat Basil Buffin,M.S., CCC-SLP 09/24/2022,4:24 PM

## 2022-09-24 NOTE — ED Notes (Signed)
ED TO INPATIENT HANDOFF REPORT  ED Nurse Name and Phone #: Amil Amen [phone]  S Name/Age/Gender [name] 62 y.o. female Room/Bed: 018C/018C  Code Status   Code Status: Full Code  Home/SNF/Other Skilled nursing facility Patient oriented to: self, place, time, and situation Is this baseline? Yes   Triage Complete: Triage complete  Chief Complaint SOB (shortness of breath) [R06.02]  Triage Note Pt from [facility] c/o 3 days of N/V with SOB x 1 day. Staff palced pt on 3L and had her lying flat. Sats 90% on arrival of EMS. Once they sat her up sats improved to 100% on 3L. Rhonci noted to upper lobes   Allergies No Known Allergies  Level of Care/Admitting Diagnosis ED Disposition     ED Disposition  Admit   Condition  --   Comment  Hospital Area: [facility] [[id number]]  Level of Care: Telemetry Medical [104]  May place patient in observation at [facility] or [facility] if equivalent level of care is available:: Yes  Covid Evaluation: Asymptomatic - no recent exposure (last 10 days) testing not required  Diagnosis: SOB (shortness of breath) [440102]  Admitting Physician: Darrold Junker  Attending Physician: Darrold Junker          B Medical/Surgery History Past Medical History:  Diagnosis Date   Abnormality of gait 05/10/2010   BACK PAIN 11/14/2008   Class 1 obesity due to excess calories with body mass index (BMI) of 31.0 to 31.9 in adult 02/07/2021   DIABETES MELLITUS, TYPE II 07/15/2008   Diplopia 07/15/2008   ECZEMA, ATOPIC 04/03/2009   GERD (gastroesophageal reflux disease)    Guillain-Barre (HCC) 1988   HYPERLIPIDEMIA 03/06/2009   HYPERTENSION 07/15/2008   Stroke (HCC) 2010, 2011   x2    Vertebral artery stenosis    Past Surgical History:  Procedure Laterality Date   ABDOMINAL HYSTERECTOMY     BIOPSY  07/08/2022   Procedure: BIOPSY;  Surgeon: Benancio Deeds, MD;  Location: [facility] END[facility];  Service:  Gastroenterology;;   COLONOSCOPY WITH PROPOFOL N/A 07/08/2022   Procedure: COLONOSCOPY WITH PROPOFOL;  Surgeon: Benancio Deeds, MD;  Location: [facility] ENDOS[facility];  Service: Gastroenterology;  Laterality: N/A;   DILATION AND CURETTAGE OF UTERUS     FOOT SURGERY     IR ANGIO INTRA EXTRACRAN SEL COM CAROTID INNOMINATE UNI L MOD SED  09/17/2021   IR ANGIO INTRA EXTRACRAN SEL INTERNAL CAROTID UNI R MOD SED  09/17/2021   IR ANGIO VERTEBRAL SEL VERTEBRAL UNI R MOD SED  09/17/2021   IR US GUIDE VASC ACCESS RIGHT  09/17/2021     A IV Location/Drains/Wounds Patient Lines/Drains/Airways Status     Active Line/Drains/Airways     Name Placement date Placement time Site Days   Peripheral IV 09/23/22 20 G Left Antecubital 09/23/22  2230  Antecubital  1   Wound / Incision (Open or Dehisced) 06/20/22 Non-pressure wound;Other (Comment) Bilateral 06/20/22  1022  --  96            Intake/Output Last 24 hours  Intake/Output Summary (Last 24 hours) at 09/24/2022 0436 Last data filed at 09/24/2022 0008 Gross per 24 hour  Intake 250 ml  Output --  Net 250 ml    Labs/Imaging Results for orders placed or performed during the hospital encounter of 09/23/22 (from the past 48 hour(s))  Comprehensive metabolic panel     Status: Abnormal   Collection Time: 09/23/22 10:37 PM  Result Value  Ref Range   Sodium 135 135 - 145 mmol/L   Potassium 4.7 3.5 - 5.1 mmol/L    Comment: HEMOLYSIS AT THIS LEVEL MAY AFFECT RESULT   Chloride 104 98 - 111 mmol/L   CO2 22 22 - 32 mmol/L   Glucose, Bld 141 (H) 70 - 99 mg/dL    Comment: Glucose reference range applies only to samples taken after fasting for at least 8 hours.   BUN 18 8 - 23 mg/dL   Creatinine, Ser 4.09 0.44 - 1.00 mg/dL   Calcium 9.1 8.9 - 81.1 mg/dL   Total Protein 6.7 6.5 - 8.1 g/dL   Albumin 2.9 (L) 3.5 - 5.0 g/dL   AST 914 (H) 15 - 41 U/L    Comment: HEMOLYSIS AT THIS LEVEL MAY AFFECT RESULT   ALT 124 (H) 0 - 44 U/L    Comment: HEMOLYSIS AT THIS  LEVEL MAY AFFECT RESULT   Alkaline Phosphatase 106 38 - 126 U/L   Total Bilirubin 0.9 0.3 - 1.2 mg/dL    Comment: HEMOLYSIS AT THIS LEVEL MAY AFFECT RESULT   GFR, Estimated >60 >60 mL/min    Comment: (NOTE) Calculated using the CKD-EPI Creatinine Equation (2021)    Anion gap 9 5 - 15    Comment: Performed at [facility], [address] [place], [place] [address]  CBC     Status: Abnormal   Collection Time: 09/23/22 10:37 PM  Result Value Ref Range   WBC 7.2 4.0 - 10.5 K/uL   RBC 4.00 3.87 - 5.11 MIL/uL   Hemoglobin 10.1 (L) 12.0 - 15.0 g/dL   HCT 62.1 (L) 30.8 - 65.7 %   MCV 78.5 (L) 80.0 - 100.0 fL   MCH 25.3 (L) 26.0 - 34.0 pg   MCHC 32.2 30.0 - 36.0 g/dL   RDW 84.6 (H) 96.2 - 95.2 %   Platelets 213 150 - 400 K/uL    Comment: REPEATED TO VERIFY   nRBC 0.0 0.0 - 0.2 %    Comment: Performed at [facility], [address] [place], [place] [address]  Brain natriuretic peptide     Status: None   Collection Time: 09/23/22 10:37 PM  Result Value Ref Range   B Natriuretic Peptide 94.2 0.0 - 100.0 pg/mL    Comment: Performed at [facility], [address], [place], [place] [address]  Resp panel by RT-PCR (RSV, Flu A&B, Covid) Anterior Nasal Swab     Status: None   Collection Time: 09/23/22 11:07 PM   Specimen: Anterior Nasal Swab  Result Value Ref Range   SARS Coronavirus 2 by RT PCR NEGATIVE NEGATIVE   Influenza A by PCR NEGATIVE NEGATIVE   Influenza B by PCR NEGATIVE NEGATIVE    Comment: (NOTE) The Xpert Xpress SARS-CoV-2/FLU/RSV plus assay is intended as an aid in the diagnosis of influenza from Nasopharyngeal swab specimens and should not be used as a sole basis for treatment. Nasal washings and aspirates are unacceptable for Xpert Xpress SARS-CoV-2/FLU/RSV testing.  Fact Sheet for Patients: BloggerCourse.com  Fact Sheet for Healthcare Providers: SeriousBroker.it  This test is not yet approved or  cleared by the [place] FDA and has been authorized for detection and/or diagnosis of SARS-CoV-2 by FDA under an Emergency Use Authorization (EUA). This EUA will remain in effect (meaning this test can be used) for the duration of the COVID-19 declaration under Section 564(b)(1) of the Act, 21 U.S.C. section 360bbb-3(b)(1), unless the authorization is terminated or  revoked.     Resp Syncytial Virus by PCR NEGATIVE NEGATIVE    Comment: (NOTE) Fact Sheet for Patients: BloggerCourse.com  Fact Sheet for Healthcare Providers: SeriousBroker.it  This test is not yet approved or cleared by the [place] FDA and has been authorized for detection and/or diagnosis of SARS-CoV-2 by FDA under an Emergency Use Authorization (EUA). This EUA will remain in effect (meaning this test can be used) for the duration of the COVID-19 declaration under Section 564(b)(1) of the Act, 21 U.S.C. section 360bbb-3(b)(1), unless the authorization is terminated or revoked.  Performed at [facility], [address] [place], [place] [address]   Lactic acid, plasma     Status: None   Collection Time: 09/24/22 12:17 AM  Result Value Ref Range   Lactic Acid, Venous 0.7 0.5 - 1.9 mmol/L    Comment: Performed at [facility], [address], [place], [place] [address]  I-Stat venous blood gas, [facility], [facility], [facility])     Status: Abnormal   Collection Time: 09/24/22 12:28 AM  Result Value Ref Range   pH, Ven 7.518 (H) 7.25 - 7.43   pCO2, Ven 30.5 (L) 44 - 60 mmHg   pO2, Ven 70 (H) 32 - 45 mmHg   Bicarbonate 24.8 20.0 - 28.0 mmol/L   TCO2 26 22 - 32 mmol/L   O2 Saturation 96 %   Acid-Base Excess 2.0 0.0 - 2.0 mmol/L   Sodium 137 135 - 145 mmol/L   Potassium 3.7 3.5 - 5.1 mmol/L   Calcium, Ion 1.20 1.15 - 1.40 mmol/L   HCT 31.0 (L) 36.0 - 46.0 %   Hemoglobin 10.5 (L) 12.0 - 15.0 g/dL   Patient temperature 82.9 C    Sample type VENOUS   Lactic  acid, plasma     Status: None   Collection Time: 09/24/22  2:15 AM  Result Value Ref Range   Lactic Acid, Venous 0.7 0.5 - 1.9 mmol/L    Comment: Performed at [facility], [address] [place], [place] [address]  Troponin I (High Sensitivity)     Status: None   Collection Time: 09/24/22  2:40 AM  Result Value Ref Range   Troponin I (High Sensitivity) 11 <18 ng/L    Comment: (NOTE) Elevated high sensitivity troponin I (hsTnI) values and significant  changes across serial measurements may suggest ACS but many other  chronic and acute conditions are known to elevate hsTnI results.  Refer to the "Links" section for chest pain algorithms and additional  guidance. Performed at [facility], [address], [place], [place] [address]   CBC     Status: Abnormal   Collection Time: 09/24/22  4:06 AM  Result Value Ref Range   WBC 5.7 4.0 - 10.5 K/uL   RBC 3.97 3.87 - 5.11 MIL/uL   Hemoglobin 10.2 (L) 12.0 - 15.0 g/dL   HCT 84.6 (L) 96.2 - 95.2 %   MCV 78.3 (L) 80.0 - 100.0 fL   MCH 25.7 (L) 26.0 - 34.0 pg   MCHC 32.8 30.0 - 36.0 g/dL   RDW 84.1 (H) 32.4 - 40.1 %   Platelets 300 150 - 400 K/uL   nRBC 0.0 0.0 - 0.2 %    Comment: Performed at [facility], [address], [place], [place] [address]   CT Angio Chest PE W/Cm &/Or Wo Cm  Result Date: 09/24/2022 CLINICAL DATA:  Pulmonary embolism (PE) suspected, high prob hypoxic, tachypneic  pt -- some rhonchi but concern for PE given her sx and clear CXR EXAM: CT ANGIOGRAPHY CHEST WITH CONTRAST TECHNIQUE: Multidetector CT imaging of the chest was performed using the standard protocol during bolus administration of intravenous contrast. Multiplanar CT image reconstructions and MIPs were obtained to evaluate the vascular anatomy. RADIATION DOSE REDUCTION: This exam was performed according to the departmental dose-optimization program which includes automated exposure control, adjustment of the mA and/or kV according to patient  size and/or use of iterative reconstruction technique. CONTRAST:  75mL OMNIPAQUE IOHEXOL 350 MG/ML SOLN COMPARISON:  05/29/2022. FINDINGS: Cardiovascular: No filling defects in the pulmonary arteries to suggest pulmonary emboli. Heart borderline in size. Aorta normal caliber. Scattered aortic calcifications. Retroesophageal right subclavian artery. Mediastinum/Nodes: Fall bulky mediastinal adenopathy again noted, worsening since prior study. Index left axillary lymph node has a short axis diameter of 2.9 cm compared to 2.5 cm previously. Index right hilar lymph node has a short axis diameter of 2.8 cm compared with 1.6 cm previously. Increasing mediastinal lymph node size. Subcarinal lymph node has a short axis diameter of 15 mm compared to 10 mm previously. Prevascular lymph node has a short axis diameter of 8 mm compared to 5 mm previously. Trachea and esophagus are unremarkable. Thyroid unremarkable. Lungs/Pleura: Ground-glass airspace opacities in the perihilar regions, left slightly greater than right. Favor asymmetric edema although infection cannot be excluded. No effusions. Upper Abdomen: Gallbladder appears distended with possible wall thickening and stones within the gallbladder. Low-density throughout the liver suggesting fatty infiltration. Musculoskeletal: Chest wall soft tissues are unremarkable. No acute bony abnormality. Review of the MIP images confirms the above findings. IMPRESSION: Worsening bulky bilateral axillary adenopathy concerning for lymphoma. Borderline sized mediastinal lymph nodes. Borderline heart size. Scattered ground-glass predominately perihilar airspace opacities, left greater than right could reflect early asymmetric edema or infection. Gallbladder appears distended with possible wall thickening and internal stones. Consider further evaluation with right upper quadrant ultrasound. Aortic atherosclerosis. Electronically Signed   By: Charlett Nose M.D.   On: 09/24/2022 01:19   DG  Chest 2 View  Result Date: 09/23/2022 CLINICAL DATA:  Shortness of breath. Left greater than right rhonchi. Hypoxia. EXAM: CHEST - 2 VIEW COMPARISON:  Radiograph 08/26/2022. CT 05/29/2022 FINDINGS: Lung volumes are low. Heart size upper normal. Stable mediastinal contours. Atelectasis/scarring in the left mid lung. No acute airspace disease. Normal pulmonary vasculature. No pleural fluid or pneumothorax no acute osseous abnormalities are seen. IMPRESSION: Low lung volumes with atelectasis/scarring in the left mid lung. Electronically Signed   By: Narda Rutherford M.D.   On: 09/23/2022 23:05    Pending Labs Unresulted Labs (From admission, onward)     Start     Ordered   09/24/22 0500  Comprehensive metabolic panel  Tomorrow morning,   R        09/24/22 0401   09/24/22 0358  Magnesium  Add-on,   AD        09/24/22 0357   09/24/22 0327  Procalcitonin  Add-on,   AD       References:    Procalcitonin Lower Respiratory Tract Infection AND Sepsis Procalcitonin Algorithm   09/24/22 0326            Vitals/Pain Today's Vitals   09/24/22 0300 09/24/22 0315 09/24/22 0330 09/24/22 0415  BP: 135/83 129/84 (!) 159/92 118/77  Pulse: (!) 108 (!) 105 (!) 106 (!) 103  Resp: 14 14 (!) 25 (!) 27  Temp:    98.1 F (36.7 C)  TempSrc:  Oral  SpO2: 99% 97% 96% 95%  Weight:      Height:      PainSc:        Isolation Precautions No active isolations  Medications Medications  pantoprazole (PROTONIX) injection 40 mg (40 mg Intravenous Given 09/24/22 0342)  furosemide (LASIX) injection 20 mg (has no administration in time range)  aspirin EC tablet 81 mg (has no administration in time range)  atorvastatin (LIPITOR) tablet 80 mg (has no administration in time range)  FLUoxetine (PROZAC) capsule 20 mg (has no administration in time range)  ezetimibe (ZETIA) tablet 10 mg (has no administration in time range)  ticagrelor (BRILINTA) tablet 90 mg (has no administration in time range)  heparin injection  5,000 Units (has no administration in time range)  sodium chloride flush (NS) 0.9 % injection 3 mL (has no administration in time range)  acetaminophen (TYLENOL) tablet 650 mg (has no administration in time range)    Or  acetaminophen (TYLENOL) suppository 650 mg (has no administration in time range)  sodium chloride flush (NS) 0.9 % injection 3 mL (has no administration in time range)  sodium chloride flush (NS) 0.9 % injection 3 mL (has no administration in time range)  0.9 %  sodium chloride infusion (has no administration in time range)  acetaminophen (TYLENOL) tablet 1,000 mg (1,000 mg Oral Given 09/23/22 2301)  ondansetron (ZOFRAN) injection 4 mg (4 mg Intravenous Given 09/23/22 2301)  sodium chloride 0.9 % bolus 250 mL (0 mLs Intravenous Stopped 09/24/22 0008)  ipratropium-albuterol (DUONEB) 0.5-2.5 (3) MG/3ML nebulizer solution 3 mL (3 mLs Nebulization Given 09/24/22 0107)  iohexol (OMNIPAQUE) 350 MG/ML injection 75 mL (75 mLs Intravenous Contrast Given 09/24/22 0056)    Mobility non-ambulatory     Focused Assessments Pulmonary Assessment Handoff:  Lung sounds: Bilateral Breath Sounds: Rhonchi O2 Device: Nasal Cannula O2 Flow Rate (L/min): 3 L/min    R Recommendations: See Admitting Provider Note  Report given to:   Additional Notes: hx previous CVA, unable to move left arm, weal left leg. I will give her IV lasix and have placed her on a purewick

## 2022-09-24 NOTE — Progress Notes (Signed)
SLP Cancellation Note  Patient Details Name: [name] MRN: [id number] DOB: December 08, 1960   Cancelled treatment:       Reason Eval/Treat Not Completed: Patient at procedure or test/unavailable   Pat Charbel Los,M.S., CCC-SLP 09/24/2022, 3:07 PM

## 2022-09-24 NOTE — ED Notes (Signed)
Pt's sister at bedside.

## 2022-09-24 NOTE — Assessment & Plan Note (Addendum)
Suspect aspiration PNA from her vomiting.  Will start unasyn. Patient shortness of breath may also be from her lymphadenopathy. CTA shows: Worsening bulky bilateral axillary adenopathy concerning for lymphoma. Borderline sized mediastinal lymph nodes. Borderline heart size. Scattered ground-glass predominately perihilar airspace opacities, left greater than right could reflect early asymmetric edema or infection. Gallbladder appears distended with possible wall thickening and internal stones. Consider further evaluation with right upper quadrant ultrasound. Aortic atherosclerosis.

## 2022-09-25 ENCOUNTER — Observation Stay (HOSPITAL_COMMUNITY): Payer: Commercial Managed Care - HMO

## 2022-09-25 DIAGNOSIS — Z794 Long term (current) use of insulin: Secondary | ICD-10-CM | POA: Diagnosis not present

## 2022-09-25 DIAGNOSIS — E1165 Type 2 diabetes mellitus with hyperglycemia: Secondary | ICD-10-CM | POA: Diagnosis present

## 2022-09-25 DIAGNOSIS — J690 Pneumonitis due to inhalation of food and vomit: Secondary | ICD-10-CM | POA: Diagnosis present

## 2022-09-25 DIAGNOSIS — F419 Anxiety disorder, unspecified: Secondary | ICD-10-CM | POA: Diagnosis present

## 2022-09-25 DIAGNOSIS — K219 Gastro-esophageal reflux disease without esophagitis: Secondary | ICD-10-CM | POA: Diagnosis present

## 2022-09-25 DIAGNOSIS — R0602 Shortness of breath: Secondary | ICD-10-CM | POA: Diagnosis present

## 2022-09-25 DIAGNOSIS — E785 Hyperlipidemia, unspecified: Secondary | ICD-10-CM | POA: Diagnosis present

## 2022-09-25 DIAGNOSIS — R1312 Dysphagia, oropharyngeal phase: Secondary | ICD-10-CM | POA: Diagnosis present

## 2022-09-25 DIAGNOSIS — E6609 Other obesity due to excess calories: Secondary | ICD-10-CM | POA: Diagnosis present

## 2022-09-25 DIAGNOSIS — I69391 Dysphagia following cerebral infarction: Secondary | ICD-10-CM | POA: Diagnosis not present

## 2022-09-25 DIAGNOSIS — K626 Ulcer of anus and rectum: Secondary | ICD-10-CM | POA: Diagnosis present

## 2022-09-25 DIAGNOSIS — J9601 Acute respiratory failure with hypoxia: Secondary | ICD-10-CM | POA: Diagnosis present

## 2022-09-25 DIAGNOSIS — Z8669 Personal history of other diseases of the nervous system and sense organs: Secondary | ICD-10-CM | POA: Diagnosis not present

## 2022-09-25 DIAGNOSIS — I69328 Other speech and language deficits following cerebral infarction: Secondary | ICD-10-CM | POA: Diagnosis not present

## 2022-09-25 DIAGNOSIS — F32A Depression, unspecified: Secondary | ICD-10-CM | POA: Diagnosis present

## 2022-09-25 DIAGNOSIS — R197 Diarrhea, unspecified: Secondary | ICD-10-CM | POA: Diagnosis present

## 2022-09-25 DIAGNOSIS — E860 Dehydration: Secondary | ICD-10-CM | POA: Diagnosis present

## 2022-09-25 DIAGNOSIS — E440 Moderate protein-calorie malnutrition: Secondary | ICD-10-CM | POA: Diagnosis present

## 2022-09-25 DIAGNOSIS — L309 Dermatitis, unspecified: Secondary | ICD-10-CM | POA: Diagnosis present

## 2022-09-25 DIAGNOSIS — C8590 Non-Hodgkin lymphoma, unspecified, unspecified site: Secondary | ICD-10-CM | POA: Diagnosis present

## 2022-09-25 DIAGNOSIS — I69354 Hemiplegia and hemiparesis following cerebral infarction affecting left non-dominant side: Secondary | ICD-10-CM | POA: Diagnosis not present

## 2022-09-25 DIAGNOSIS — M542 Cervicalgia: Secondary | ICD-10-CM | POA: Diagnosis present

## 2022-09-25 DIAGNOSIS — I10 Essential (primary) hypertension: Secondary | ICD-10-CM | POA: Diagnosis present

## 2022-09-25 DIAGNOSIS — Z7982 Long term (current) use of aspirin: Secondary | ICD-10-CM | POA: Diagnosis not present

## 2022-09-25 DIAGNOSIS — Z1152 Encounter for screening for COVID-19: Secondary | ICD-10-CM | POA: Diagnosis not present

## 2022-09-25 LAB — GLUCOSE, CAPILLARY
Glucose-Capillary: 121 mg/dL — ABNORMAL HIGH (ref 70–99)
Glucose-Capillary: 127 mg/dL — ABNORMAL HIGH (ref 70–99)
Glucose-Capillary: 128 mg/dL — ABNORMAL HIGH (ref 70–99)
Glucose-Capillary: 157 mg/dL — ABNORMAL HIGH (ref 70–99)
Glucose-Capillary: 174 mg/dL — ABNORMAL HIGH (ref 70–99)
Glucose-Capillary: 98 mg/dL (ref 70–99)

## 2022-09-25 MED ORDER — VITAMIN B-12 1000 MCG PO TABS
1000.0000 ug | ORAL_TABLET | Freq: Every day | ORAL | Status: DC
  Administered 2022-09-25 – 2022-09-27 (×3): 1000 ug via ORAL
  Filled 2022-09-25 (×3): qty 1

## 2022-09-25 MED ORDER — CAMPHOR-MENTHOL 0.5-0.5 % EX LOTN
1.0000 | TOPICAL_LOTION | Freq: Every morning | CUTANEOUS | Status: DC
  Administered 2022-09-25 – 2022-09-27 (×3): 1 via TOPICAL
  Filled 2022-09-25: qty 222

## 2022-09-25 MED ORDER — BISACODYL 10 MG RE SUPP
10.0000 mg | Freq: Once | RECTAL | Status: AC
  Administered 2022-09-25: 10 mg via RECTAL
  Filled 2022-09-25: qty 1

## 2022-09-25 MED ORDER — POLYETHYLENE GLYCOL 3350 17 G PO PACK
17.0000 g | PACK | Freq: Two times a day (BID) | ORAL | Status: DC
  Administered 2022-09-25 – 2022-09-26 (×3): 17 g via ORAL
  Filled 2022-09-25 (×3): qty 1

## 2022-09-25 MED ORDER — LIDOCAINE 4 % EX CREA
1.0000 | TOPICAL_CREAM | Freq: Four times a day (QID) | CUTANEOUS | Status: DC | PRN
  Administered 2022-09-25 – 2022-09-26 (×2): 1 via TOPICAL
  Filled 2022-09-25: qty 5

## 2022-09-25 MED ORDER — HYDROXYZINE HCL 10 MG PO TABS: 10.0000 mg | ORAL_TABLET | Freq: Three times a day (TID) | ORAL | Status: DC | PRN

## 2022-09-25 MED ORDER — METHOCARBAMOL 500 MG PO TABS
500.0000 mg | ORAL_TABLET | Freq: Two times a day (BID) | ORAL | Status: DC
  Administered 2022-09-25 – 2022-09-27 (×5): 500 mg via ORAL
  Filled 2022-09-25 (×5): qty 1

## 2022-09-25 NOTE — Progress Notes (Signed)
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 Wyvonnia Lora for NHL-is at very high threshold for chemo per him as she is chronically debilitated secondary to her strokes etc. for treatment and last scans showed enlarging lymph nodes   Return to ED SOB as she was less than 90% on room air, Rx Unasyn IVF in ED given she was found to have pneumonia  Hospital-Problem based course  Aspiration pneumonia likely secondary to large CVA affecting the left side Continue Unasyn, saline 75 cc/H No cultures performed on admission so empiric coverage at this time and de-escalate in 1 to 2 days depending on tolerance of dysphagia 1 diet-appreciate SLP input Mandatory 60 degrees while sitting up and slow bites and further input Suggest outpatient goals of care  Non-Hodgkin's lymphoma followed by Dr. Carmie Kanner Poor candidacy aggressive treatment given large stroke-outpatient further discussion  Prior rectal ulcers requiring Canasa No complaints today will examine sacrum/rectum at next opportunity-Canasa ordered  Diabetes mellitus with improved control May/2024 A1c 6.7 CBGs ranging 129-197 continue SSI Continue on discharge sliding scale-I have held the patient's long-acting Semglee insulin 5 units for now as well as Trulicity 0.75 weekly  Prior large stroke affecting left side Continue ticagrelor 90 twice daily, aspirin 81, Lipitor 80, Zetia 10----for spasm can resume Robaxin 500 twice daily Secondary control  Depression/anxiety Continue Prozac 20, hydroxyzine 50  twice daily  DVT prophylaxis: Ticagrelor/heparin Code Status: Full Family Communication: Long discussion with Sister [name] as well as patient's niece and nephew at the bedside Disposition:  Status is: Observation The patient will require care spanning > 2 midnights and should be moved to inpatient because  Requires further stability   Subjective: Awake coherent no distress Complaining of some chest discomfort Seems consistent and constant No alarm features for cardiogenic nature I feel this is secondary to pneumonia  Objective: Vitals:   09/24/22 1911 09/24/22 2336 09/25/22 0408 09/25/22 0730  BP: (!) 143/88 (!) 153/93 (!) 165/77 (!) 174/90  Pulse: (!) 104 (!) 105 99 (!) 104  Resp: 18 16 16 20   Temp: 98.1 F (36.7 C) 98 F (36.7 C) 97.9 F (36.6 C) (!) 97.3 F (36.3 C)  TempSrc: Oral Oral Oral Oral  SpO2: 95% 98% 100% 98%  Weight:      Height:        Intake/Output Summary (Last 24 hours) at 09/25/2022 0955 Last data filed at 09/24/2022 2130 Gross per 24 hour  Intake 30 ml  Output 700 ml  Net -670 ml   Filed Weights   09/23/22 2152  Weight: 69.4 kg    Examination:  EOMI NCAT no focal deficit no icterus no pallor no rales no rhonchi but decreased air entry posterolaterally on the left side with some adventitious sounds Dense deficits on the left side unable to raise left hand left leg reflexes are hyperreflexic in those areas Speech is a little garbled secondary to her stroke Abdomen soft no rebound no guarding No lower extremity edema  Data Reviewed: personally reviewed   CBC    Component Value Date/Time   WBC 5.7 09/24/2022 0406   RBC 3.97 09/24/2022  0406   HGB 10.2 (L) 09/24/2022 0406   HGB 11.3 (L) 09/12/2022 1134   HCT 31.1 (L) 09/24/2022 0406   PLT 300 09/24/2022 0406   PLT 329 09/12/2022 1134   MCV 78.3 (L) 09/24/2022 0406   MCH 25.7 (L) 09/24/2022 0406   MCHC 32.8 09/24/2022 0406   RDW 15.9 (H) 09/24/2022 0406   LYMPHSABS 2.7 09/12/2022 1134    MONOABS 0.9 09/12/2022 1134   EOSABS 0.2 09/12/2022 1134   BASOSABS 0.0 09/12/2022 1134      Latest Ref Rng & Units 09/24/2022    6:42 AM 09/24/2022   12:28 AM 09/23/2022   10:37 PM  CMP  Glucose 70 - 99 mg/dL 308   657   BUN 8 - 23 mg/dL 16   18   Creatinine 8.46 - 1.00 mg/dL 9.62   9.52   Sodium 841 - 145 mmol/L 136  137  135   Potassium 3.5 - 5.1 mmol/L 3.6  3.7  4.7   Chloride 98 - 111 mmol/L 101   104   CO2 22 - 32 mmol/L 23   22   Calcium 8.9 - 10.3 mg/dL 9.4   9.1   Total Protein 6.5 - 8.1 g/dL 7.0   6.7   Total Bilirubin 0.3 - 1.2 mg/dL 0.6   0.9   Alkaline Phos 38 - 126 U/L 109   106   AST 15 - 41 U/L 66   101   ALT 0 - 44 U/L 125   124      Radiology Studies: US Abdomen Limited RUQ (LIVER/GB)  Result Date: 09/24/2022 CLINICAL DATA:  Gallbladder wall thickening. EXAM: ULTRASOUND ABDOMEN LIMITED RIGHT UPPER QUADRANT COMPARISON:  08/26/2022. FINDINGS: Gallbladder: The gallbladder is distended with sludge. No gallbladder wall thickening. No sonographic Murphy sign noted by sonographer. Common bile duct: Diameter: 5.3 mm Liver: No focal lesion identified. Within normal limits in parenchymal echogenicity. Portal vein is patent on color Doppler imaging with normal direction of blood flow towards the liver. Other: No free fluid. IMPRESSION: Distended gallbladder with sludge. No evidence of acute cholecystitis. Electronically Signed   By: Thornell Sartorius M.D.   On: 09/24/2022 04:46   CT Angio Chest PE W/Cm &/Or Wo Cm  Result Date: 09/24/2022 CLINICAL DATA:  Pulmonary embolism (PE) suspected, high prob hypoxic, tachypneic pt -- some rhonchi but concern for PE given her sx and clear CXR EXAM: CT ANGIOGRAPHY CHEST WITH CONTRAST TECHNIQUE: Multidetector CT imaging of the chest was performed using the standard protocol during bolus administration of intravenous contrast. Multiplanar CT image reconstructions and MIPs were obtained to evaluate the vascular anatomy. RADIATION DOSE REDUCTION:  This exam was performed according to the departmental dose-optimization program which includes automated exposure control, adjustment of the mA and/or kV according to patient size and/or use of iterative reconstruction technique. CONTRAST:  75mL OMNIPAQUE IOHEXOL 350 MG/ML SOLN COMPARISON:  05/29/2022. FINDINGS: Cardiovascular: No filling defects in the pulmonary arteries to suggest pulmonary emboli. Heart borderline in size. Aorta normal caliber. Scattered aortic calcifications. Retroesophageal right subclavian artery. Mediastinum/Nodes: Fall bulky mediastinal adenopathy again noted, worsening since prior study. Index left axillary lymph node has a short axis diameter of 2.9 cm compared to 2.5 cm previously. Index right hilar lymph node has a short axis diameter of 2.8 cm compared with 1.6 cm previously. Increasing mediastinal lymph node size. Subcarinal lymph node has a short axis diameter of 15 mm compared to 10 mm previously. Prevascular lymph node has a short axis diameter  of 8 mm compared to 5 mm previously. Trachea and esophagus are unremarkable. Thyroid unremarkable. Lungs/Pleura: Ground-glass airspace opacities in the perihilar regions, left slightly greater than right. Favor asymmetric edema although infection cannot be excluded. No effusions. Upper Abdomen: Gallbladder appears distended with possible wall thickening and stones within the gallbladder. Low-density throughout the liver suggesting fatty infiltration. Musculoskeletal: Chest wall soft tissues are unremarkable. No acute bony abnormality. Review of the MIP images confirms the above findings. IMPRESSION: Worsening bulky bilateral axillary adenopathy concerning for lymphoma. Borderline sized mediastinal lymph nodes. Borderline heart size. Scattered ground-glass predominately perihilar airspace opacities, left greater than right could reflect early asymmetric edema or infection. Gallbladder appears distended with possible wall thickening and  internal stones. Consider further evaluation with right upper quadrant ultrasound. Aortic atherosclerosis. Electronically Signed   By: Charlett Nose M.D.   On: 09/24/2022 01:19   DG Chest 2 View  Result Date: 09/23/2022 CLINICAL DATA:  Shortness of breath. Left greater than right rhonchi. Hypoxia. EXAM: CHEST - 2 VIEW COMPARISON:  Radiograph 08/26/2022. CT 05/29/2022 FINDINGS: Lung volumes are low. Heart size upper normal. Stable mediastinal contours. Atelectasis/scarring in the left mid lung. No acute airspace disease. Normal pulmonary vasculature. No pleural fluid or pneumothorax no acute osseous abnormalities are seen. IMPRESSION: Low lung volumes with atelectasis/scarring in the left mid lung. Electronically Signed   By: Narda Rutherford M.D.   On: 09/23/2022 23:05     Scheduled Meds:  aspirin EC  81 mg Oral Daily   atorvastatin  80 mg Oral Daily   ezetimibe  10 mg Oral Daily   FLUoxetine  20 mg Oral Daily   heparin  5,000 Units Subcutaneous BID   insulin aspart  0-9 Units Subcutaneous TID WC   pantoprazole (PROTONIX) IV  40 mg Intravenous Q12H   sodium chloride flush  3 mL Intravenous Q12H   sodium chloride flush  3 mL Intravenous Q12H   ticagrelor  90 mg Oral BID   Continuous Infusions:  sodium chloride     sodium chloride 75 mL/hr at 09/24/22 1659   ampicillin-sulbactam (UNASYN) IV 3 g (09/25/22 0701)     LOS: 0 days   Time spent: 42  Rhetta Mura, MD [facility] To contact the attending provider between 7A-7P or the covering provider during after hours 7P-7A, please log into the web site [email or web address] and access using universal [place] password for that web site. If you do not have the password, please call the hospital operator.  09/25/2022, 9:55 AM

## 2022-09-25 NOTE — Progress Notes (Signed)
Modified Barium Swallow Study  Patient Details  Name: [name] MRN: [id number] Date of Birth: 01/08/1961  Today's Date: 09/25/2022  Modified Barium Swallow completed.  Full report located under Chart Review in the Imaging Section.  History of Present Illness [name] is an 62 y.o. female   Admission for 62 y/o x med problems.   H/O NHL in remission.  Nausea / vomiting and dehydration   Generalized weakness.   Last night she developed cough and SOB.   90% o2 at RA.  CT chest revealed  Scattered ground-glass predominately  perihilar airspace opacities, left greater than right could reflect  early asymmetric edema or infection.  ST   Clinical Impression Pt demonstrates a moderate oral dysphagia due to dentition impacting labial seal. Pt has anterior spillage of liquids and decreased bolus cohesion of thins with oral residue around anterior and lateral sulci. However, when given solids pt is able to masticate and form bolus. Cleared solid from mouth. Pt hates puree and would like to avoid this texture if possible. Risk of aspiration is not from solids, but from thin liquids. There is an additional pharyngeal dysphagia with delayed initaition of swallow even when a adequate oral bolus hold is achieved. Pt has sensed and silent penetration before the swallow with subsequent aspiration of penetrate when glottic closure is released. A chin tuck was trialed which was effective with a single sip, but not with consecutive sips. A supersupraglottic swallow has potential but was not achieved in this study. Pt was able to consume nectar thick liquids  with a straw with only trace penetration. Recommend dys 3 diet with nectar at this time. Factors that may increase risk of adverse event in presence of aspiration [name] & [name] 2021): Inadequate oral hygiene  Swallow Evaluation Recommendations Recommendations: PO diet PO Diet Recommendation: Dysphagia 3 (Mechanical soft);Mildly thick liquids  (Level 2, nectar thick) Liquid Administration via: Straw Medication Administration: Whole meds with puree Supervision: Staff to assist with self-feeding Swallowing strategies  : Slow rate;Small bites/sips;Check for anterior loss Postural changes: Position pt fully upright for meals;Stay upright 30-60 min after meals Oral care recommendations: Oral care BID (2x/day) Caregiver Recommendations: Have oral suction available      Bayley Yarborough, Riley Nearing 09/25/2022,11:50 AM

## 2022-09-25 NOTE — Progress Notes (Signed)
SLP Cancellation Note  Patient Details Name: Magie Mahle MRN: [id number] DOB: 01-16-1961   Cancelled treatment:       Reason Eval/Treat Not Completed: Other (comment). Requested MBS today, will complete in pm if radiology schedule allows   Bianca Vester, Riley Nearing 09/25/2022, 8:17 AM

## 2022-09-26 DIAGNOSIS — E440 Moderate protein-calorie malnutrition: Secondary | ICD-10-CM | POA: Insufficient documentation

## 2022-09-26 DIAGNOSIS — R0602 Shortness of breath: Secondary | ICD-10-CM | POA: Diagnosis not present

## 2022-09-26 LAB — GLUCOSE, CAPILLARY
Glucose-Capillary: 132 mg/dL — ABNORMAL HIGH (ref 70–99)
Glucose-Capillary: 140 mg/dL — ABNORMAL HIGH (ref 70–99)
Glucose-Capillary: 137 mg/dL — ABNORMAL HIGH (ref 70–99)
Glucose-Capillary: 119 mg/dL — ABNORMAL HIGH (ref 70–99)
Glucose-Capillary: 142 mg/dL — ABNORMAL HIGH (ref 70–99)

## 2022-09-26 LAB — BASIC METABOLIC PANEL
Anion gap: 9 (ref 5–15)
BUN: 8 mg/dL (ref 8–23)
CO2: 23 mmol/L (ref 22–32)
Calcium: 8.9 mg/dL (ref 8.9–10.3)
Chloride: 107 mmol/L (ref 98–111)
Creatinine, Ser: 0.78 mg/dL (ref 0.44–1.00)
GFR, Estimated: >60 mL/min (ref >60)
Glucose, Bld: 138 mg/dL — ABNORMAL HIGH (ref 70–99)
Potassium: 3.2 mmol/L — ABNORMAL LOW (ref 3.5–5.1)
Sodium: 139 mmol/L (ref 135–145)

## 2022-09-26 LAB — CBC
HCT: 31.2 % — ABNORMAL LOW (ref 36.0–46.0)
Hemoglobin: 10.2 g/dL — ABNORMAL LOW (ref 12.0–15.0)
MCH: 25.7 pg — ABNORMAL LOW (ref 26.0–34.0)
MCHC: 32.7 g/dL (ref 30.0–36.0)
MCV: 78.6 fL — ABNORMAL LOW (ref 80.0–100.0)
Platelets: 245 10*3/uL (ref 150–400)
RBC: 3.97 MIL/uL (ref 3.87–5.11)
RDW: 15.3 % (ref 11.5–15.5)
WBC: 6.5 10*3/uL (ref 4.0–10.5)
nRBC: 0 % (ref 0.0–0.2)

## 2022-09-26 MED ORDER — ADULT MULTIVITAMIN W/MINERALS CH
1.0000 | ORAL_TABLET | Freq: Every day | ORAL | Status: DC
  Administered 2022-09-26 – 2022-09-27 (×2): 1 via ORAL
  Filled 2022-09-26 (×2): qty 1

## 2022-09-26 MED ORDER — LABETALOL HCL 5 MG/ML IV SOLN
10.0000 mg | INTRAVENOUS | Status: DC | PRN
  Administered 2022-09-26 – 2022-09-27 (×2): 10 mg via INTRAVENOUS
  Filled 2022-09-26 (×3): qty 4

## 2022-09-26 MED ORDER — MESALAMINE 1000 MG RE SUPP
1000.0000 mg | Freq: Two times a day (BID) | RECTAL | Status: DC
  Administered 2022-09-26 – 2022-09-27 (×2): 1000 mg via RECTAL
  Filled 2022-09-26 (×3): qty 1

## 2022-09-26 MED ORDER — TRAMADOL HCL 50 MG PO TABS
50.0000 mg | ORAL_TABLET | Freq: Once | ORAL | Status: AC
  Administered 2022-09-26: 50 mg via ORAL
  Filled 2022-09-26: qty 1

## 2022-09-26 MED ORDER — GLUCERNA SHAKE PO LIQD
237.0000 mL | Freq: Three times a day (TID) | ORAL | Status: DC
  Administered 2022-09-26 – 2022-09-27 (×3): 237 mL via ORAL
  Filled 2022-09-26: qty 237

## 2022-09-26 MED ORDER — POTASSIUM CHLORIDE CRYS ER 20 MEQ PO TBCR
40.0000 meq | EXTENDED_RELEASE_TABLET | Freq: Every day | ORAL | Status: DC
  Administered 2022-09-26 – 2022-09-27 (×2): 40 meq via ORAL
  Filled 2022-09-26 (×2): qty 2

## 2022-09-26 MED ORDER — AMOXICILLIN-POT CLAVULANATE 875-125 MG PO TABS
1.0000 | ORAL_TABLET | Freq: Two times a day (BID) | ORAL | Status: DC
  Administered 2022-09-26 – 2022-09-27 (×3): 1 via ORAL
  Filled 2022-09-26 (×3): qty 1

## 2022-09-26 MED ORDER — ONDANSETRON HCL 4 MG/2ML IJ SOLN
4.0000 mg | Freq: Four times a day (QID) | INTRAMUSCULAR | Status: DC | PRN
  Administered 2022-09-26: 4 mg via INTRAVENOUS
  Filled 2022-09-26: qty 2

## 2022-09-26 MED ORDER — POLYETHYLENE GLYCOL 3350 17 G PO PACK: 17.0000 g | PACK | Freq: Every day | ORAL | Status: DC

## 2022-09-26 NOTE — Progress Notes (Signed)
PROGRESS NOTE   [name]  ZOX:[id number] DOB: 01-24-1961 DOA: 09/23/2022 PCP: [place], [name]  Brief Narrative:  62 year old female from [place] with known multiple strokes last 03/30/2019 for right medullary CVA with extension on 06/12/2022--complicated hospitalization with aspiration Rectal ulcers requiring Canasa suppositories Chronic debilitation secondary to left hemiparesis Poor diabetic control DM TY 2 A1c 6.7 in May Chronic spasm and neck pain   Follows with [name] for NHL-is at very high threshold for chemo per him as she is chronically debilitated secondary to her strokes etc. for treatment and last scans showed enlarging lymph nodes   Return to ED SOB as she was less than 90% on room air, Rx Unasyn IVF in ED given she was found to have pneumonia  Hospital-Problem based course  Aspiration pneumonia likely secondary to large CVA affecting the left side Transition Unasyn-->Augmentin stop date 5/30 saline lock 5/27 Mandatory 60 degrees while sitting up and slow bites appreciated of SLP-dysphagia 3 diet + nectar Suggest outpatient goals of care  Non-Hodgkin's lymphoma followed by [name] Poor candidacy aggressive treatment given large stroke-outpatient further discussion [name] who I will CC  Prior rectal ulcers requiring Canasa Has MASD as well as areas of moisture sacral area with loose stool and rectal discomfort-if no better in a.m. will get [facility] gastroenterology input as outpatient as she was just seen 08/31/2022 by [name] at [facility] Given she is having good stools we will cut back MiraLAX to once daily May need?  Diltiazem/lidocaine cream if no better in a.m.  Diabetes mellitus with improved control May/2024 A1c 6.7 CBGs ranging 120-140 Continue on discharge sliding scale-I have held the patient's long-acting Semglee insulin 5 units for now as well as Trulicity 0.75 weekly  Prior large stroke affecting left side Continue  ticagrelor 90 twice daily, aspirin 81, Lipitor 80, Zetia 10----for spasm can resume Robaxin 500 twice daily Secondary control  Depression/anxiety Continue Prozac 20, hydroxyzine 50 twice daily  DVT prophylaxis: Ticagrelor/heparin Code Status: Full Family Communication: Multiple long discussions with [name] sister previously no family present today Disposition:  Status is: Observation The patient will require care spanning > 2 midnights and should be moved to inpatient because  Requires further stability likely can discharge to [facility] a.m. if all stable and pain in rectum is better   Subjective:  Main complaint today is rectal pain and she is continuously having stool and she feels discomfort No fever She is coherent although it is difficult to understand given sequelae of stroke  Objective: Vitals:   09/25/22 1946 09/26/22 0414 09/26/22 0739 09/26/22 1646  BP: (!) 173/99 (!) 166/93 (!) 182/92 (!) 187/102  Pulse: (!) 107 99 97 (!) 106  Resp: 18 14 18 18   Temp: 98.8 F (37.1 C) 98.1 F (36.7 C) 98 F (36.7 C) 98 F (36.7 C)  TempSrc: Oral Oral    SpO2: 96% 98% 97% 97%  Weight:      Height:        Intake/Output Summary (Last 24 hours) at 09/26/2022 1727 Last data filed at 09/26/2022 0511 Gross per 24 hour  Intake 1428.78 ml  Output 1800 ml  Net -371.22 ml    Filed Weights   09/23/22 2152  Weight: 69.4 kg    Examination:  EOMI NCAT no focal deficit no icterus she seems somewhat uncomfortable Chest has some posterior lateral additional rales Rectal area is a little bit excoriated has soft much like stool I do not appreciate  any hemorrhoids but stool did not allow me to in visit to properly Abdomen is soft Dense left-sided deficits remain  Data Reviewed: personally reviewed   CBC    Component Value Date/Time   WBC 6.5 09/26/2022 0441   RBC 3.97 09/26/2022 0441   HGB 10.2 (L) 09/26/2022 0441   HGB 11.3 (L) 09/12/2022 1134   HCT 31.2 (L) 09/26/2022 0441    PLT 245 09/26/2022 0441   PLT 329 09/12/2022 1134   MCV 78.6 (L) 09/26/2022 0441   MCH 25.7 (L) 09/26/2022 0441   MCHC 32.7 09/26/2022 0441   RDW 15.3 09/26/2022 0441   LYMPHSABS 2.7 09/12/2022 1134   MONOABS 0.9 09/12/2022 1134   EOSABS 0.2 09/12/2022 1134   BASOSABS 0.0 09/12/2022 1134      Latest Ref Rng & Units 09/26/2022    4:41 AM 09/24/2022    6:42 AM 09/24/2022   12:28 AM  CMP  Glucose 70 - 99 mg/dL 161  096    BUN 8 - 23 mg/dL 8  16    Creatinine 0.45 - 1.00 mg/dL 4.09  8.11    Sodium 914 - 145 mmol/L 139  136  137   Potassium 3.5 - 5.1 mmol/L 3.2  3.6  3.7   Chloride 98 - 111 mmol/L 107  101    CO2 22 - 32 mmol/L 23  23    Calcium 8.9 - 10.3 mg/dL 8.9  9.4    Total Protein 6.5 - 8.1 g/dL  7.0    Total Bilirubin 0.3 - 1.2 mg/dL  0.6    Alkaline Phos 38 - 126 U/L  109    AST 15 - 41 U/L  66    ALT 0 - 44 U/L  125       Radiology Studies: DG Swallowing Func-Speech Pathology  Result Date: 09/25/2022 Table formatting from the original result was not included. Modified Barium Swallow Study Patient Details Name: [name] MRN: [id number] Date of Birth: March 29, 1961 Today's Date: 09/25/2022 HPI/PMH: HPI: [name] is an 62 y.o. female   Admission for 62 y/o x med problems.   H/O NHL in remission.  Nausea / vomiting and dehydration   Generalized weakness.   Last night she developed cough and SOB.   90% o2 at RA.  CT chest revealed  Scattered ground-glass predominately  perihilar airspace opacities, left greater than right could reflect  early asymmetric edema or infection.  ST Clinical Impression: Pt demonstrates a moderate oral dysphagia due to dentition impacting labial seal. Pt has anterior spillage of liquids and decreased bolus cohesion of thins with oral residue around anterior and lateral sulci. However, when given solids pt is able to masticate and form bolus. Cleared solid from mouth. Pt hates puree and would like to avoid this texture if possible. Risk of  aspiration is not from solids, but from thin liquids. There is an additional pharyngeal dysphagia with delayed initaition of swallow even when a adequate oral bolus hold is achieved. Pt has sensed and silent penetration before the swallow with subsequent aspiration of penetrate when glottic closure is released. A chin tuck was trialed which was effective with a single sip, but not with consecutive sips. A supersupraglottic swallow has potential but was not achieved in this study. Pt was able to consume nectar thick liquids  with a straw with only trace penetration. Recommend dys 3 diet with nectar at this time. Factors that may increase risk of adverse event in presence of aspiration [name] & [name]  2021): Factors that may increase risk of adverse event in presence of aspiration [name] & [name] 2021): Inadequate oral hygiene Recommendations/Plan: Swallowing Evaluation Recommendations Swallowing Evaluation Recommendations Recommendations: PO diet PO Diet Recommendation: Dysphagia 3 (Mechanical soft); Mildly thick liquids (Level 2, nectar thick) Liquid Administration via: Straw Medication Administration: Whole meds with puree Supervision: Staff to assist with self-feeding Swallowing strategies  : Slow rate; Small bites/sips; Check for anterior loss Postural changes: Position pt fully upright for meals; Stay upright 30-60 min after meals Oral care recommendations: Oral care BID (2x/day) Caregiver Recommendations: Have oral suction available Treatment Plan Treatment Plan Treatment recommendations: Therapy as outlined in treatment plan below Follow-up recommendations: Skilled nursing-short term rehab (<3 hours/day) Functional status assessment: Patient has had a recent decline in their functional status and demonstrates the ability to make significant improvements in function in a reasonable and predictable amount of time. Treatment frequency: Min 2x/week Treatment duration: 2 weeks Interventions: Aspiration  precaution training Recommendations Recommendations for follow up therapy are one component of a multi-disciplinary discharge planning process, led by the attending physician.  Recommendations may be updated based on patient status, additional functional criteria and insurance authorization. Assessment: Orofacial Exam: Orofacial Exam Oral Cavity - Dentition: Missing dentition Anatomy: Anatomy: WFL Boluses Administered: Boluses Administered Boluses Administered: Thin liquids (Level 0); Mildly thick liquids (Level 2, nectar thick); Moderately thick liquids (Level 3, honey thick); Puree; Solid  Oral Impairment Domain: Oral Impairment Domain Lip Closure: Escape beyond mid-chin Tongue control during bolus hold: Posterior escape of greater than half of bolus Bolus preparation/mastication: Slow prolonged chewing/mashing with complete recollection Bolus transport/lingual motion: Slow tongue motion Oral residue: Residue collection on oral structures Location of oral residue : Floor of mouth; Lateral sulci Initiation of pharyngeal swallow : Pyriform sinuses  Pharyngeal Impairment Domain: Pharyngeal Impairment Domain Soft palate elevation: No bolus between soft palate (SP)/pharyngeal wall (PW) Laryngeal elevation: Complete superior movement of thyroid cartilage with complete approximation of arytenoids to epiglottic petiole Anterior hyoid excursion: Complete anterior movement Epiglottic movement: Complete inversion Laryngeal vestibule closure: Complete, no air/contrast in laryngeal vestibule Pharyngeal stripping wave : Present - complete Pharyngeal contraction (A/P view only): N/A Pharyngoesophageal segment opening: Complete distension and complete duration, no obstruction of flow Tongue base retraction: No contrast between tongue base and posterior pharyngeal wall (PPW) Pharyngeal residue: Complete pharyngeal clearance  Esophageal Impairment Domain: Esophageal Impairment Domain Esophageal clearance upright position:  Esophageal retention Pill: Esophageal Impairment Domain Esophageal clearance upright position: Esophageal retention Penetration/Aspiration Scale Score: Penetration/Aspiration Scale Score 1.  Material does not enter airway: Thin liquids (Level 0); Mildly thick liquids (Level 2, nectar thick); Moderately thick liquids (Level 3, honey thick); Puree; Solid; Pill 2.  Material enters airway, remains ABOVE vocal cords then ejected out: Mildly thick liquids (Level 2, nectar thick) 7.  Material enters airway, passes BELOW cords and not ejected out despite cough attempt by patient: Thin liquids (Level 0) 8.  Material enters airway, passes BELOW cords without attempt by patient to eject out (silent aspiration) : Thin liquids (Level 0) Compensatory Strategies: Compensatory Strategies Compensatory strategies: Yes Straw: Effective Chin tuck: Effective; Ineffective Effective Chin Tuck: Thin liquid (Level 0) (via single sip) Ineffective Chin Tuck: Thin liquid (Level 0) (consecutive sip)   General Information: Caregiver present: No  Diet Prior to this Study: Thin liquids (Level 0); Dysphagia 1 (pureed)   Temperature : Normal   Respiratory Status: Increased WOB   Supplemental O2: Nasal cannula   History of Recent Intubation: No  Behavior/Cognition: Alert; Cooperative; Other (Comment)  Self-Feeding Abilities: Needs assist with self-feeding Baseline vocal quality/speech: Hypophonia/low volume Volitional Cough: Able to elicit Volitional Swallow: Able to elicit No data recorded Goal Planning: Prognosis for improved oropharyngeal function: Good No data recorded No data recorded Patient/Family Stated Goal: Feel better No data recorded Pain: Pain Assessment Pain Assessment: No/denies pain Faces Pain Scale: 4 Pain Location: buttock Pain Descriptors / Indicators: Discomfort Pain Intervention(s): Monitored during session End of Session: Start Time:SLP Start Time (ACUTE ONLY): 1110 Stop Time: SLP Stop Time (ACUTE ONLY): 1130 Time Calculation:SLP  Time Calculation (min) (ACUTE ONLY): 20 min Charges: SLP Evaluations $ SLP Speech Visit: 1 Visit SLP Evaluations $BSS Swallow: 1 Procedure $MBS Swallow: 1 Procedure $Swallowing Treatment: 1 Procedure SLP visit diagnosis: SLP Visit Diagnosis: Dysphagia, oropharyngeal phase (R13.12) Past Medical History: Past Medical History: Diagnosis Date  Abnormality of gait 05/10/2010  BACK PAIN 11/14/2008  Class 1 obesity due to excess calories with body mass index (BMI) of 31.0 to 31.9 in adult 02/07/2021  DIABETES MELLITUS, TYPE II 07/15/2008  Diplopia 07/15/2008  ECZEMA, ATOPIC 04/03/2009  GERD (gastroesophageal reflux disease)   Guillain-Barre (HCC) 1988  HYPERLIPIDEMIA 03/06/2009  HYPERTENSION 07/15/2008  Stroke (HCC) 2010, 2011  x2   Vertebral artery stenosis  Past Surgical History: Past Surgical History: Procedure Laterality Date  ABDOMINAL HYSTERECTOMY    BIOPSY  07/08/2022  Procedure: BIOPSY;  Surgeon: Benancio Deeds, Mata;  Location: [facility];  Service: Gastroenterology;;  COLONOSCOPY WITH PROPOFOL N/A 07/08/2022  Procedure: COLONOSCOPY WITH PROPOFOL;  Surgeon: Benancio Deeds, Mata;  Location: [facility];  Service: Gastroenterology;  Laterality: N/A;  DILATION AND CURETTAGE OF UTERUS    FOOT SURGERY    IR ANGIO INTRA EXTRACRAN SEL COM CAROTID INNOMINATE UNI L MOD SED  09/17/2021  IR ANGIO INTRA EXTRACRAN SEL INTERNAL CAROTID UNI R MOD SED  09/17/2021  IR ANGIO VERTEBRAL SEL VERTEBRAL UNI R MOD SED  09/17/2021  IR US GUIDE VASC ACCESS RIGHT  09/17/2021 DeBlois, Riley Nearing 09/25/2022, 11:51 AM    Scheduled Meds:  amoxicillin-clavulanate  1 tablet Oral Q12H   aspirin EC  81 mg Oral Daily   atorvastatin  80 mg Oral Daily   camphor-menthol  1 Application Topical q AM   cyanocobalamin  1,000 mcg Oral Daily   ezetimibe  10 mg Oral Daily   feeding supplement (GLUCERNA SHAKE)  237 mL Oral TID BM   FLUoxetine  20 mg Oral Daily   heparin  5,000 Units Subcutaneous BID   insulin aspart  0-9 Units Subcutaneous  TID WC   methocarbamol  500 mg Oral BID   multivitamin with minerals  1 tablet Oral Daily   pantoprazole (PROTONIX) IV  40 mg Intravenous Q12H   polyethylene glycol  17 g Oral BID   potassium chloride  40 mEq Oral Daily   sodium chloride flush  3 mL Intravenous Q12H   sodium chloride flush  3 mL Intravenous Q12H   ticagrelor  90 mg Oral BID   Continuous Infusions:  sodium chloride     sodium chloride 75 mL/hr at 09/26/22 1217     LOS: 1 day   Time spent: 29  Rhetta Mura, Mata [facility] To contact the attending provider between 7A-7P or the covering provider during after hours 7P-7A, please log into the web site [email or web address] and access using universal [place] password for that web site. If you do not have the password, please call the hospital operator.  09/26/2022, 5:27 PM

## 2022-09-26 NOTE — Evaluation (Signed)
Physical Therapy Evaluation Patient Details Name: [name] MRN: [id number] DOB: 1960/08/24 Today's Date: 09/26/2022  History of Present Illness  [name] is a 62 year old female from [facility] who presents with SOB. She was admitted with pneumonia. 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  Clinical Impression   Pt presents with generalized weakness L>R given chronic cva, impaired balance, max difficulty mobilizing, and decreased activity tolerance. Pt to benefit from acute PT to address deficits. Pt requiring max +2 assist for bed mobility and transfer x2 OOB, pt limited by dyspnea (VSS on 2LO2), fatigue, and bowel/bladder incontinence. PT to progress mobility as tolerated, and will continue to follow acutely.         Recommendations for follow up therapy are one component of a multi-disciplinary discharge planning process, led by the attending physician.  Recommendations may be updated based on patient status, additional functional criteria and insurance authorization.  Follow Up Recommendations Can patient physically be transported by private vehicle: No     Assistance Recommended at Discharge Frequent or constant Supervision/Assistance  Patient can return home with the following  Two people to help with walking and/or transfers;Two people to help with bathing/dressing/bathroom    Equipment Recommendations None recommended by PT  Recommendations for Other Services       Functional Status Assessment Patient has had a recent decline in their functional status and demonstrates the ability to make significant improvements in function in a reasonable and predictable amount of time.     Precautions / Restrictions Precautions Precautions: Fall Precaution Comments: bowel/bladder incontinence, 2L O2 spo2 93-96% Restrictions Weight Bearing Restrictions: No      Mobility  Bed  Mobility Overal bed mobility: Needs Assistance Bed Mobility: Sidelying to Sit, Rolling Rolling: Max assist Sidelying to sit: Max assist, +2 for physical assistance, +2 for safety/equipment       General bed mobility comments: rolled L&R for hygiene and linen change    Transfers Overall transfer level: Needs assistance Equipment used: 2 person hand held assist Transfers: Sit to/from Stand, Bed to chair/wheelchair/BSC Sit to Stand: Mod assist, +2 safety/equipment, +2 physical assistance   Step pivot transfers: Max assist, +2 physical assistance, +2 safety/equipment       General transfer comment: assist for rise, steady, bilat LE blocking during pivot to recliner. Stand x2    Ambulation/Gait               General Gait Details: nt  Information systems manager [place] (Stroke Patients Only)       Balance Overall balance assessment: Needs assistance Sitting-balance support: Feet supported, Single extremity supported Sitting balance-Leahy Scale: Poor     Standing balance support: Bilateral upper extremity supported, During functional activity Standing balance-Leahy Scale: Zero Standing balance comment: max +2                             Pertinent Vitals/Pain Pain Assessment Pain Assessment: Faces Faces Pain Scale: Hurts little more Pain Location: "rectal hole" Pain Descriptors / Indicators: Discomfort, Grimacing Pain Intervention(s): Limited activity within patient's tolerance, Monitored during session, Repositioned    Home Living Family/patient expects to be discharged to:: Skilled nursing facility                   Additional Comments: Pt reports she was  working with PT/OT on standing and transferring at SNF    Prior Function Prior Level of Function : Needs assist             Mobility Comments: Working on SPT at rehab, also reports use of stedy ADLs Comments: Pt reports staff assists with all aspects  of her self care     Hand Dominance   Dominant Hand: Right    Extremity/Trunk Assessment   Upper Extremity Assessment Upper Extremity Assessment: Defer to OT evaluation RUE Deficits / Details: using functionally but globally weak LUE Deficits / Details: limited movement throughout, uable to get full ROM. Per pt, she wears a resting hand splint at night LUE Sensation: decreased light touch;decreased proprioception LUE Coordination: decreased fine motor;decreased gross motor    Lower Extremity Assessment Lower Extremity Assessment: Generalized weakness;LLE deficits/detail LLE Deficits / Details: 1/5 knee flex/ext, history of cva    Cervical / Trunk Assessment Cervical / Trunk Assessment: Kyphotic  Communication   Communication: No difficulties (verbose)  Cognition Arousal/Alertness: Awake/alert Behavior During Therapy: Flat affect Overall Cognitive Status: Impaired/Different from baseline Area of Impairment: Attention, Following commands, Memory, Safety/judgement, Awareness, Problem solving                   Current Attention Level: Sustained Memory: Decreased short-term memory, Decreased recall of precautions Following Commands: Follows one step commands with increased time Safety/Judgement: Decreased awareness of safety, Decreased awareness of deficits Awareness: Intellectual Problem Solving: Slow processing, Decreased initiation, Requires verbal cues, Difficulty sequencing General Comments: soiled upon arrival and seemingly unaware. poor attention, requires cues for initiation, sequencing and problem solving. Unreliable historian.        General Comments General comments (skin integrity, edema, etc.): VSS on 2L [place]    Exercises     Assessment/Plan    PT Assessment Patient needs continued PT services  PT Problem List Decreased strength;Decreased mobility;Decreased range of motion;Decreased activity tolerance;Decreased balance;Decreased knowledge of use of  DME;Pain;Decreased safety awareness       PT Treatment Interventions DME instruction;Therapeutic activities;Therapeutic exercise;Balance training;Patient/family education;Functional mobility training    PT Goals (Current goals can be found in the Care Plan section)  Acute Rehab PT Goals PT Goal Formulation: With patient Time For Goal Achievement: 10/10/22 Potential to Achieve Goals: Good    Frequency Min 2X/week     Co-evaluation PT/OT/SLP Co-Evaluation/Treatment: Yes Reason for Co-Treatment: Complexity of the patient's impairments (multi-system involvement);For patient/therapist safety;To address functional/ADL transfers PT goals addressed during session: Mobility/safety with mobility;Balance OT goals addressed during session: ADL's and self-care       AM-PAC PT "6 Clicks" Mobility  Outcome Measure Help needed turning from your back to your side while in a flat bed without using bedrails?: A Lot Help needed moving from lying on your back to sitting on the side of a flat bed without using bedrails?: A Lot Help needed moving to and from a bed to a chair (including a wheelchair)?: Total Help needed standing up from a chair using your arms (e.g., wheelchair or bedside chair)?: Total Help needed to walk in hospital room?: Total Help needed climbing 3-5 steps with a railing? : Total 6 Click Score: 8    End of Session Equipment Utilized During Treatment: Gait belt;Oxygen Activity Tolerance: Patient tolerated treatment well;Patient limited by fatigue Patient left: in chair;with call bell/phone within reach;with chair alarm set Nurse Communication: Mobility status;Need for lift equipment (stedy for back to bed) PT Visit Diagnosis: Other abnormalities of gait and mobility (R26.89);Muscle weakness (generalized) (  M62.81)    Time: 9147-8295 PT Time Calculation (min) (ACUTE ONLY): 31 min   Charges:   PT Evaluation $PT Eval Low Complexity: 1 Low          Raha Tennison S, PT DPT Acute  Rehabilitation Services Secure Chat Preferred  Office [phone]    Lacee Grey E Stroup 09/26/2022, 1:15 PM

## 2022-09-26 NOTE — Progress Notes (Signed)
Initial Nutrition Assessment  DOCUMENTATION CODES:   Non-severe (moderate) malnutrition in context of acute illness/injury  INTERVENTION:  - Add Glucerna Shake po TID, each supplement provides 220 kcal and 10 grams of protein  - Add MVI q day.   NUTRITION DIAGNOSIS:   Moderate Malnutrition related to acute illness as evidenced by mild muscle depletion, moderate muscle depletion, percent weight loss.  GOAL:   Patient will meet greater than or equal to 90% of their needs  MONITOR:   PO intake, Supplement acceptance  REASON FOR ASSESSMENT:   Malnutrition Screening Tool    ASSESSMENT:   62 y.o. female admits related to nausea, vomiting, diarrhea, and SOB. PMH includes: T2DM, GERD, HLD, HTN. Pt is currently receiving medical management related to aspiration pneumonia likely secondary to large CVA affecting the left side.  Meds reviewed: lipitor, Vit B12, sliding scale insulin, miralax, klor-con. Labs reviewed: K low.   Pt's speech is slightly difficult to understand. Difficult to get a good picture of nutrition hx. Pt does report that she has had poor  PO intakes related to chewing and swallowing since admission. SLP team is following. RD will add Glucerna with meals. RD will continue to closely monitor PO intakes.   Per record, pt has experienced a 17% wt loss in less than 3 months.   NUTRITION - FOCUSED PHYSICAL EXAM:  Flowsheet Row Most Recent Value  Orbital Region No depletion  Upper Arm Region Mild depletion  Thoracic and Lumbar Region Unable to assess  Buccal Region No depletion  Temple Region Mild depletion  Clavicle Bone Region Mild depletion  Clavicle and Acromion Bone Region Mild depletion  Scapular Bone Region Unable to assess  Dorsal Hand No depletion  Patellar Region Moderate depletion  Anterior Thigh Region Moderate depletion  Posterior Calf Region Moderate depletion  Edema (RD Assessment) None  Hair Reviewed  Eyes Reviewed  Mouth Reviewed  Skin  Reviewed  Nails Reviewed       Diet Order:   Diet Order             DIET DYS 3 Fluid consistency: Nectar Thick  Diet effective now                   EDUCATION NEEDS:   Not appropriate for education at this time  Skin:  Skin Assessment: Reviewed RN Assessment  Last BM:  5/26 - type 4  Height:   Ht Readings from Last 1 Encounters:  09/23/22 5\' 4"  (1.626 m)    Weight:   Wt Readings from Last 1 Encounters:  09/23/22 69.4 kg    Ideal Body Weight:     BMI:  Body mass index is 26.26 kg/m.  Estimated Nutritional Needs:   Kcal:  1735-2080 kcals  Protein:  85-105 gm  Fluid:  >/= 1.7 L  Bethann Humble, RD, LDN, CNSC.

## 2022-09-26 NOTE — Evaluation (Signed)
Occupational Therapy Evaluation Patient Details Name: [name] MRN: [id number] DOB: [birth year] Today's Date: 09/26/2022   History of Present Illness [name] is a 62 year old female from [facility] who presents with SOB. She was admitted with pneumonia. 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   Clinical Impression   [name] was evaluated s/p the above admission list. She is from SNF and needs assist for ADLs and transfers at baseline. Upon evaluation she was limited by L hemibody sensory motor deficits, generalized weakness, poor balance, incontinent bowel/bladder, impaired cognition, verbosity and decreased activity tolerance. Overall she required mod/max +2 for bed mobility, mod A +2 to stand EOB and max A +2 to pivot to the chair. Due to the deficits listed below she also needs up to max A +2 for LB ADLs and mod A for UB ADLs. She reports she wears a L resting hand splint sometimes ar SNF, but it is not here with her acutely. Pt will benefit from continued acute OT services and skilled inpatient follow up therapy, <3 hours/day.       Recommendations for follow up therapy are one component of a multi-disciplinary discharge planning process, led by the attending physician.  Recommendations may be updated based on patient status, additional functional criteria and insurance authorization.   Assistance Recommended at Discharge Frequent or constant Supervision/Assistance  Patient can return home with the following A lot of help with walking and/or transfers;Two people to help with walking and/or transfers;Two people to help with bathing/dressing/bathroom;A lot of help with bathing/dressing/bathroom;Assistance with cooking/housework;Direct supervision/assist for financial management;Direct supervision/assist for medications management;Assistance with feeding;Assist for transportation;Help with  stairs or ramp for entrance    Functional Status Assessment  Patient has had a recent decline in their functional status and demonstrates the ability to make significant improvements in function in a reasonable and predictable amount of time.  Equipment Recommendations  None recommended by OT    Recommendations for Other Services Rehab consult     Precautions / Restrictions Precautions Precautions: Fall Precaution Comments: bowel/bladder incontinence, 2L O2 Restrictions Weight Bearing Restrictions: No      Mobility Bed Mobility Overal bed mobility: Needs Assistance Bed Mobility: Sidelying to Sit, Rolling Rolling: Max assist Sidelying to sit: Max assist, +2 for physical assistance, +2 for safety/equipment       General bed mobility comments: rolled L&R for hygiene and linen change    Transfers Overall transfer level: Needs assistance Equipment used: 2 person hand held assist Transfers: Sit to/from Stand, Bed to chair/wheelchair/BSC Sit to Stand: Mod assist, +2 safety/equipment, +2 physical assistance     Step pivot transfers: Max assist, +2 physical assistance, +2 safety/equipment            Balance Overall balance assessment: Needs assistance Sitting-balance support: Feet supported Sitting balance-Leahy Scale: Poor     Standing balance support: Bilateral upper extremity supported, During functional activity Standing balance-Leahy Scale: Poor                             ADL either performed or assessed with clinical judgement   ADL Overall ADL's : Needs assistance/impaired Eating/Feeding: Minimal assistance;Sitting Eating/Feeding Details (indicate cue type and reason): per SLP, pt needs full supvervision A fro feeding Grooming: Minimal assistance;Sitting   Upper Body Bathing: Moderate assistance;Sitting   Lower Body Bathing: Maximal assistance;+2 for safety/equipment;+2 for physical assistance;Sit to/from stand   Upper  Body Dressing :  Moderate assistance;Sitting   Lower Body Dressing: +2 for physical assistance;+2 for safety/equipment;Sit to/from stand;Total assistance   Toilet Transfer: Maximal assistance;+2 for safety/equipment;+2 for physical assistance;Stand-pivot;Regular Toilet   Toileting- Clothing Manipulation and Hygiene: Total assistance;+2 for safety/equipment;+2 for physical assistance;Sit to/from stand       Functional mobility during ADLs: Maximal assistance;+2 for physical assistance;+2 for safety/equipment General ADL Comments: limited by incontinence, impiared cognition, weakness, L hemiplegia, poor activity tolernace     Vision Baseline Vision/History: 0 No visual deficits Vision Assessment?: No apparent visual deficits     Perception Perception Perception Tested?: No   Praxis Praxis Praxis tested?: Not tested    Pertinent Vitals/Pain Pain Assessment Pain Assessment: Faces Faces Pain Scale: Hurts little more Pain Location: "rectal hole" Pain Descriptors / Indicators: Discomfort, Grimacing Pain Intervention(s): Monitored during session, Limited activity within patient's tolerance     Hand Dominance Right   Extremity/Trunk Assessment Upper Extremity Assessment Upper Extremity Assessment: RUE deficits/detail;LUE deficits/detail RUE Deficits / Details: using functionally but globally weak LUE Deficits / Details: limited movement throughout, uable to get full ROM. Per pt, she wears a resting hand splint at night LUE Sensation: decreased light touch;decreased proprioception LUE Coordination: decreased fine motor;decreased gross motor   Lower Extremity Assessment Lower Extremity Assessment: Defer to PT evaluation   Cervical / Trunk Assessment Cervical / Trunk Assessment: Kyphotic   Communication Communication Communication: No difficulties (verbose)   Cognition Arousal/Alertness: Awake/alert Behavior During Therapy: Flat affect Overall Cognitive Status: Impaired/Different from  baseline Area of Impairment: Attention, Following commands, Memory, Safety/judgement, Awareness, Problem solving                   Current Attention Level: Sustained Memory: Decreased short-term memory, Decreased recall of precautions Following Commands: Follows one step commands with increased time Safety/Judgement: Decreased awareness of safety, Decreased awareness of deficits Awareness: Intellectual Problem Solving: Slow processing, Decreased initiation, Requires verbal cues, Difficulty sequencing General Comments: soiled upon arrival and seemingly unaware. poor attention, requires cues for initiation, sequencing and problem solving. Unreliable historian.     General Comments  VSS on 2L [place]    Exercises     Shoulder Instructions      Home Living Family/patient expects to be discharged to:: Skilled nursing facility                                 Additional Comments: Pt reports she was working with PT/OT on standing and transferring at SNF      Prior Functioning/Environment Prior Level of Function : Needs assist             Mobility Comments: Working on [employer] tranfsers wth rehab ADLs Comments: Pt reports staff assists with all aspects of her self care        OT Problem List: Decreased strength;Decreased range of motion;Decreased activity tolerance;Impaired balance (sitting and/or standing);Decreased cognition;Decreased knowledge of use of DME or AE;Decreased knowledge of precautions;Decreased safety awareness      OT Treatment/Interventions: Self-care/ADL training;Therapeutic exercise;DME and/or AE instruction;Therapeutic activities;Balance training;Patient/family education    OT Goals(Current goals can be found in the care plan section) Acute Rehab OT Goals Patient Stated Goal: to get stonger OT Goal Formulation: Patient unable to participate in goal setting Time For Goal Achievement: 10/10/22 Potential to Achieve Goals: Good ADL Goals Pt Will  Perform Grooming: with set-up;sitting Pt Will Perform Upper Body Dressing: with set-up;sitting Pt Will Perform Lower Body  Dressing: with mod assist;sit to/from stand Pt Will Transfer to Toilet: with mod assist;stand pivot transfer;bedside commode  OT Frequency: Min 2X/week    Co-evaluation PT/OT/SLP Co-Evaluation/Treatment: Yes Reason for Co-Treatment: Complexity of the patient's impairments (multi-system involvement);For patient/therapist safety;To address functional/ADL transfers   OT goals addressed during session: ADL's and self-care      AM-PAC OT "6 Clicks" Daily Activity     Outcome Measure Help from another person eating meals?: A Little Help from another person taking care of personal grooming?: A Little Help from another person toileting, which includes using toliet, bedpan, or urinal?: A Lot Help from another person bathing (including washing, rinsing, drying)?: A Lot Help from another person to put on and taking off regular upper body clothing?: A Lot Help from another person to put on and taking off regular lower body clothing?: Total 6 Click Score: 13   End of Session Equipment Utilized During Treatment: Gait belt;Oxygen Nurse Communication: Mobility status  Activity Tolerance: Patient tolerated treatment well Patient left: in chair;with call bell/phone within reach;with chair alarm set  OT Visit Diagnosis: Unsteadiness on feet (R26.81);Other abnormalities of gait and mobility (R26.89);Muscle weakness (generalized) (M62.81);Hemiplegia and hemiparesis                Time: 1610-9604 OT Time Calculation (min): 31 min Charges:  OT General Charges $OT Visit: 1 Visit OT Evaluation $OT Eval Moderate Complexity: 1 Mod  Derenda Mis, OTR/L Acute Rehabilitation Services Office [phone] Secure Chat Communication Preferred   Donia Pounds 09/26/2022, 12:46 PM

## 2022-09-26 NOTE — Progress Notes (Signed)
89- MD notified of [place] MEWS related to hypertension 170's-180's systolic and increased HR. Pt with c/o rectal pain 10/10. No PRN meds ordered at the time. MD ordered rectal suppository and PO pain medicine which were administered. Pt turned and repositioned off of rectum. Pt with small BM stating pain relief now 4/10. Will continue q2 turns and pain control.

## 2022-09-27 DIAGNOSIS — R0602 Shortness of breath: Secondary | ICD-10-CM | POA: Diagnosis not present

## 2022-09-27 LAB — CBC
HCT: 32.8 % — ABNORMAL LOW (ref 36.0–46.0)
Hemoglobin: 10.9 g/dL — ABNORMAL LOW (ref 12.0–15.0)
MCH: 26.4 pg (ref 26.0–34.0)
MCHC: 33.2 g/dL (ref 30.0–36.0)
MCV: 79.4 fL — ABNORMAL LOW (ref 80.0–100.0)
Platelets: 258 10*3/uL (ref 150–400)
RBC: 4.13 MIL/uL (ref 3.87–5.11)
RDW: 15.8 % — ABNORMAL HIGH (ref 11.5–15.5)
WBC: 8.7 10*3/uL (ref 4.0–10.5)
nRBC: 0 % (ref 0.0–0.2)

## 2022-09-27 LAB — BASIC METABOLIC PANEL
Anion gap: 9 (ref 5–15)
BUN: 8 mg/dL (ref 8–23)
CO2: 21 mmol/L — ABNORMAL LOW (ref 22–32)
Calcium: 8.8 mg/dL — ABNORMAL LOW (ref 8.9–10.3)
Chloride: 108 mmol/L (ref 98–111)
Creatinine, Ser: 0.81 mg/dL (ref 0.44–1.00)
GFR, Estimated: >60 mL/min (ref >60)
Glucose, Bld: 128 mg/dL — ABNORMAL HIGH (ref 70–99)
Potassium: 3.5 mmol/L (ref 3.5–5.1)
Sodium: 138 mmol/L (ref 135–145)

## 2022-09-27 LAB — GLUCOSE, CAPILLARY
Glucose-Capillary: 113 mg/dL — ABNORMAL HIGH (ref 70–99)
Glucose-Capillary: 125 mg/dL — ABNORMAL HIGH (ref 70–99)
Glucose-Capillary: 137 mg/dL — ABNORMAL HIGH (ref 70–99)
Glucose-Capillary: 144 mg/dL — ABNORMAL HIGH (ref 70–99)
Glucose-Capillary: 154 mg/dL — ABNORMAL HIGH (ref 70–99)

## 2022-09-27 MED ORDER — PANTOPRAZOLE SODIUM 40 MG PO TBEC
40.0000 mg | DELAYED_RELEASE_TABLET | Freq: Two times a day (BID) | ORAL | Status: DC
  Administered 2022-09-27: 40 mg via ORAL
  Filled 2022-09-27: qty 1

## 2022-09-27 MED ORDER — HYDROXYZINE HCL 10 MG PO TABS: 10.0000 mg | ORAL_TABLET | Freq: Three times a day (TID) | ORAL | 0 refills | Status: DC | PRN

## 2022-09-27 MED ORDER — AMOXICILLIN-POT CLAVULANATE 875-125 MG PO TABS: 1.0000 | ORAL_TABLET | Freq: Two times a day (BID) | ORAL | 0 refills | Status: AC

## 2022-09-27 MED ORDER — POLYETHYLENE GLYCOL 3350 17 G PO PACK: 17.0000 g | PACK | ORAL | 0 refills | Status: DC

## 2022-09-27 MED ORDER — FLUOXETINE HCL 20 MG PO CAPS: 20.0000 mg | ORAL_CAPSULE | Freq: Every day | ORAL | 0 refills | Status: DC

## 2022-09-27 NOTE — Discharge Summary (Signed)
Physician Discharge Summary  Tamsin Failing ZOX:[id number] DOB: May 28, 1960 DOA: 09/23/2022  PCP: [place], [name], MD  Admit date: 09/23/2022 Discharge date: 09/27/2022  Time spent: 27 minutes  Recommendations for Outpatient Follow-up:   suggest discussion with cardiology/neurology regarding discontinuation of either aspirin or ticagrelor given he is on both  needs Chem-12 CBC 1 week  recommend frank discussions in the outpatient setting to continue with oncologist Dr. Candise Che. 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-please address aspirin + ticagrelor as outpatient Depression  Please see below for itemized issues addressed in HOpsital- refer to other progress notes for clarity if needed  Discharge Condition: Fair  Diet recommendation: Diabetic hear healthy  Filed Weights   09/23/22 2152  Weight: 69.4 kg    History of present illness:  62 year old female from [place] with known multiple strokes last 03/30/2019 for right medullary CVA with extension on 06/12/2022--complicated hospitalization with aspiration Rectal ulcers requiring Canasa suppositories Chronic debilitation secondary to left hemiparesis Poor diabetic control DM TY 2 A1c 6.7 in May Chronic spasm and neck pain   Follows with Dr. Wyvonnia Lora for NHL-is at very high threshold for chemo per him as she is chronically debilitated secondary to her  strokes etc. for treatment and last scans showed enlarging lymph nodes   Return to ED SOB as she was less than 90% on room air, Rx Unasyn IVF in ED given she was found to have pneumonia  Hospital Course:  Aspiration pneumonia likely secondary to large CVA affecting the left side Transition Unasyn-->Augmentin stop date 5/30 saline lock 5/27 Mandatory 60 degrees while sitting up and slow bites appreciated of SLP-dysphagia 3 diet + nectar Suggest outpatient goals of care   Non-Hodgkin's lymphoma followed by Dr. Carmie Kanner Poor candidacy aggressive treatment given large stroke-outpatient further discussion Dr. Wyvonnia Lora who I will CC   Prior rectal ulcers requiring Canasa Has MASD as well as areas of moisture sacral area with loose stool and rectal discomfort-if no better in a.m. will get [facility] gastro[facility]ology input as outpatient as she was just seen 08/31/2022 by Ms. Steffanie Dunn at [employer] [facility] has been cut back to q. OD If possible please obtain as an outpatient diltiazem/lidocaine cream for relief from the areas and may use Tucks/hydrocortisone cream sparingly to the area   Diabetes mellitus with improved control May/2024 A1c 6.7 CBGs ranging 120-140 Continue on discharge sliding scale-I have held the patient's long-acting Semglee insulin 5 units for now as well as Trulicity 0.75 weekly These can be reimplemented on discharge   Prior large stroke affecting left side Continue ticagrelor 90 twice daily, aspirin 81, Lipitor 80, Zetia 10----for spasm can resume Robaxin 500 twice daily Secondary control Aspirin and ticagrelor have an interaction where 1 reduces efficacy of the other-would discuss this in the outpatient setting with neurology   Depression/anxiety Continue Prozac 20, hydroxyzine 50 twice daily--- scripts have been signed   Discharge Exam: Vitals:   09/27/22 0502 09/27/22 0720  BP: (!) 165/88 (!) 172/96  Pulse: (!) 107 (!) 109  Resp:  17   Temp: 98.5 F (36.9 C)  98.2 F (36.8 C)  SpO2: 94% 97%    Subj on day of d/c   Awake in some discomfort in her rectum area seems a little raw on exam Otherwise her breathing is okay she seems to be off oxygen she looks comfortable she has no chest pain She asks about whether she can go back to her rehab and I informed her that she can   Discharge Instructions   Discharge Instructions     Diet - low sodium heart healthy   Complete by: As directed    Increase activity slowly   Complete by: As directed       Allergies as of 09/27/2022   No Known Allergies      Medication List     STOP taking these medications    Cholecalciferol 50 MCG (2000 UT) Tabs   ciprofloxacin 500 MG tablet Commonly known as: Cipro   docusate sodium 100 MG capsule Commonly known as: COLACE   glucose blood test strip Commonly known as: Contour Next Test   hydrALAZINE 50 MG tablet Commonly known as: APRESOLINE   HYDROcodone-acetaminophen 5-325 MG tablet Commonly known as: NORCO/VICODIN   insulin aspart 100 UNIT/ML injection Commonly known as: novoLOG   Lantus SoloStar 100 UNIT/ML Solostar Pen Generic drug: insulin glargine   loperamide 2 MG capsule Commonly known as: IMODIUM   loratadine 10 MG tablet Commonly known as: CLARITIN   metroNIDAZOLE 500 MG tablet Commonly known as: FLAGYL   tiZANidine 2 MG tablet Commonly known as: ZANAFLEX   traMADol 50 MG tablet Commonly known as: ULTRAM       TAKE these medications    acetaminophen 325 MG tablet Commonly known as: TYLENOL Take 650 mg by mouth every 4 (four) hours as needed for mild pain. Do not exceed 3000mg  in 24 hours   AMBULATORY NON FORMULARY MEDICATION Medication Name: Carafate 50 mg suppositories: Insert 1 suppository rectally twice a day for 2 weeks   AMBULATORY NON FORMULARY MEDICATION Medication Name: Diltiazem 2%/Lidocaine 2%   Using your index finger apply a small amount of medication inside the anal opening and to the external  anal area twice daily x 6 weeks.   amLODipine 10 MG tablet Commonly known as: NORVASC Take 1 tablet by mouth once daily   amoxicillin-clavulanate 875-125 MG tablet Commonly known as: AUGMENTIN Take 1 tablet by mouth every 12 (twelve) hours for 2 days.   aspirin EC 81 MG tablet Take 1 tablet (81 mg total) by mouth daily. Swallow whole.   atorvastatin 80 MG tablet Commonly known as: Lipitor Take 1 tablet (80 mg total) by mouth daily.   Biofreeze 4 % Gel Generic drug: Menthol (Topical Analgesic) Apply 1 Application topically in the morning. Neck and shoulders.   Blood Pressure Monitor Automat Devi 1 Device by Does not apply route daily.   cyanocobalamin 1000 MCG tablet Take 1,000 mcg by mouth daily.   ezetimibe 10 MG tablet Commonly known as: ZETIA Take 1 tablet (10 mg total) by mouth daily.   FLUoxetine 20 MG capsule Commonly known as: PROZAC Take 1 capsule (20 mg total) by mouth daily.   hydrocortisone 25 MG suppository Commonly known as: ANUSOL-HC Place 25 mg rectally 2 (two) times daily as needed for hemorrhoids or anal itching.   hydrOXYzine 10 MG tablet Commonly known as: ATARAX Take 1 tablet (10 mg total) by mouth 3 (three) times daily as needed for anxiety. What changed:  medication strength how  much to take when to take this reasons to take this   insulin glargine-yfgn 100 UNIT/ML injection Commonly known as: SEMGLEE Inject 0.05 mLs (5 Units total) into the skin daily.   Lidocaine 5 % Crea Apply 1 Application topically every 6 (six) hours as needed (rectal pain).   mesalamine 1000 MG suppository Commonly known as: CANASA Place 1,000 mg rectally at bedtime. What changed: Another medication with the same name was removed. Continue taking this medication, and follow the directions you see here.   methocarbamol 500 MG tablet Commonly known as: ROBAXIN Take 1 tablet (500 mg total) by mouth 2 (two) times daily.   omeprazole 20 MG tablet Commonly known  as: PRILOSEC OTC Take 20 mg by mouth daily.   ONE TOUCH LANCETS Misc USE TO CHECK BLOOD SUGAR TWICE A DAY AND PRN   polyethylene glycol 17 g packet Commonly known as: MIRALAX / GLYCOLAX Take 17 g by mouth every other day. What changed: when to take this   saccharomyces boulardii 250 MG capsule Commonly known as: FLORASTOR Take 1 capsule (250 mg total) by mouth 2 (two) times daily.   ticagrelor 90 MG Tabs tablet Commonly known as: BRILINTA Take 1 tablet (90 mg total) by mouth 2 (two) times daily.   Trulicity 0.75 MG/0.5ML Sopn Generic drug: Dulaglutide Inject 0.75 mg into the skin once a week.   Vitamin B Complex Tabs Take 1 tablet by mouth every morning.       No Known Allergies    The results of significant diagnostics from this hospitalization (including imaging, microbiology, ancillary and laboratory) are listed below for reference.    Significant Diagnostic Studies: DG Swallowing Func-Speech Pathology  Result Date: 09/25/2022 Table formatting from the original result was not included. Modified Barium Swallow Study Patient Details Name: Vernetta Roehrig MRN: [id number] Date of Birth: 01-31-1961 Today's Date: 09/25/2022 HPI/PMH: HPI: Zelinda Gartin is an 62 y.o. female   Admission for 62 y/o x med problems.   H/O NHL in remission.  Nausea / vomiting and dehydration   Generalized weakness.   Last night she developed cough and SOB.   90% o2 at RA.  CT chest revealed  Scattered ground-glass predominately  perihilar airspace opacities, left greater than right could reflect  early asymmetric edema or infection.  ST Clinical Impression: Pt demonstrates a moderate oral dysphagia due to dentition impacting labial seal. Pt has anterior spillage of liquids and decreased bolus cohesion of thins with oral residue around anterior and lateral sulci. However, when given solids pt is able to masticate and form bolus. Cleared solid from mouth. Pt hates puree and would like to avoid this texture  if possible. Risk of aspiration is not from solids, but from thin liquids. There is an additional pharyngeal dysphagia with delayed initaition of swallow even when a adequate oral bolus hold is achieved. Pt has sensed and silent penetration before the swallow with subsequent aspiration of penetrate when glottic closure is released. A chin tuck was trialed which was effective with a single sip, but not with consecutive sips. A supersupraglottic swallow has potential but was not achieved in this study. Pt was able to consume nectar thick liquids  with a straw with only trace penetration. Recommend dys 3 diet with nectar at this time. Factors that may increase risk of adverse event in presence of aspiration [name] & [name] 2021): Factors that may increase risk of adverse event in presence of aspiration [name] & [name] 2021): Inadequate oral hygiene Recommendations/Plan: Swallowing Evaluation Recommendations  Swallowing Evaluation Recommendations Recommendations: PO diet PO Diet Recommendation: Dysphagia 3 (Mechanical soft); Mildly thick liquids (Level 2, nectar thick) Liquid Administration via: Straw Medication Administration: Whole meds with puree Supervision: Staff to assist with self-feeding Swallowing strategies  : Slow rate; Small bites/sips; Check for anterior loss Postural changes: Position pt fully upright for meals; Stay upright 30-60 min after meals Oral care recommendations: Oral care BID (2x/day) Caregiver Recommendations: Have oral suction available Treatment Plan Treatment Plan Treatment recommendations: Therapy as outlined in treatment plan below Follow-up recommendations: Skilled nursing-short term rehab (<3 hours/day) Functional status assessment: Patient has had a recent decline in their functional status and demonstrates the ability to make significant improvements in function in a reasonable and predictable amount of time. Treatment frequency: Min 2x/week Treatment duration: 2 weeks  Interventions: Aspiration precaution training Recommendations Recommendations for follow up therapy are one component of a multi-disciplinary discharge planning process, led by the attending physician.  Recommendations may be updated based on patient status, additional functional criteria and insurance authorization. Assessment: Orofacial Exam: Orofacial Exam Oral Cavity - Dentition: Missing dentition Anatomy: Anatomy: WFL Boluses Administered: Boluses Administered Boluses Administered: Thin liquids (Level 0); Mildly thick liquids (Level 2, nectar thick); Moderately thick liquids (Level 3, honey thick); Puree; Solid  Oral Impairment Domain: Oral Impairment Domain Lip Closure: Escape beyond mid-chin Tongue control during bolus hold: Posterior escape of greater than half of bolus Bolus preparation/mastication: Slow prolonged chewing/mashing with complete recollection Bolus transport/lingual motion: Slow tongue motion Oral residue: Residue collection on oral structures Location of oral residue : Floor of mouth; Lateral sulci Initiation of pharyngeal swallow : Pyriform sinuses  Pharyngeal Impairment Domain: Pharyngeal Impairment Domain Soft palate elevation: No bolus between soft palate (SP)/pharyngeal wall (PW) Laryngeal elevation: Complete superior movement of thyroid cartilage with complete approximation of arytenoids to epiglottic petiole Anterior hyoid excursion: Complete anterior movement Epiglottic movement: Complete inversion Laryngeal vestibule closure: Complete, no air/contrast in laryngeal vestibule Pharyngeal stripping wave : Present - complete Pharyngeal contraction (A/P view only): N/A Pharyngoesophageal segment opening: Complete distension and complete duration, no obstruction of flow Tongue base retraction: No contrast between tongue base and posterior pharyngeal wall (PPW) Pharyngeal residue: Complete pharyngeal clearance  Esophageal Impairment Domain: Esophageal Impairment Domain Esophageal clearance  upright position: Esophageal retention Pill: Esophageal Impairment Domain Esophageal clearance upright position: Esophageal retention Penetration/Aspiration Scale Score: Penetration/Aspiration Scale Score 1.  Material does not enter airway: Thin liquids (Level 0); Mildly thick liquids (Level 2, nectar thick); Moderately thick liquids (Level 3, honey thick); Puree; Solid; Pill 2.  Material enters airway, remains ABOVE vocal cords then ejected out: Mildly thick liquids (Level 2, nectar thick) 7.  Material enters airway, passes BELOW cords and not ejected out despite cough attempt by patient: Thin liquids (Level 0) 8.  Material enters airway, passes BELOW cords without attempt by patient to eject out (silent aspiration) : Thin liquids (Level 0) Compensatory Strategies: Compensatory Strategies Compensatory strategies: Yes Straw: Effective Chin tuck: Effective; Ineffective Effective Chin Tuck: Thin liquid (Level 0) (via single sip) Ineffective Chin Tuck: Thin liquid (Level 0) (consecutive sip)   General Information: Caregiver present: No  Diet Prior to this Study: Thin liquids (Level 0); Dysphagia 1 (pureed)   Temperature : Normal   Respiratory Status: Increased WOB   Supplemental O2: Nasal cannula   History of Recent Intubation: No  Behavior/Cognition: Alert; Cooperative; Other (Comment) Self-Feeding Abilities: Needs assist with self-feeding Baseline vocal quality/speech: Hypophonia/low volume Volitional Cough: Able to elicit Volitional Swallow: Able to elicit No data recorded  Goal Planning: Prognosis for improved oropharyngeal function: Good No data recorded No data recorded Patient/Family Stated Goal: Feel better No data recorded Pain: Pain Assessment Pain Assessment: No/denies pain Faces Pain Scale: 4 Pain Location: buttock Pain Descriptors / Indicators: Discomfort Pain Intervention(s): Monitored during session End of Session: Start Time:SLP Start Time (ACUTE ONLY): 1110 Stop Time: SLP Stop Time (ACUTE ONLY): 1130  Time Calculation:SLP Time Calculation (min) (ACUTE ONLY): 20 min Charges: SLP Evaluations $ SLP Speech Visit: 1 Visit SLP Evaluations $BSS Swallow: 1 Procedure $MBS Swallow: 1 Procedure $Swallowing Treatment: 1 Procedure SLP visit diagnosis: SLP Visit Diagnosis: Dysphagia, oropharyngeal phase (R13.12) Past Medical History: Past Medical History: Diagnosis Date  Abnormality of gait 05/10/2010  BACK PAIN 11/14/2008  Class 1 obesity due to excess calories with body mass index (BMI) of 31.0 to 31.9 in adult 02/07/2021  DIABETES MELLITUS, TYPE II 07/15/2008  Diplopia 07/15/2008  ECZEMA, ATOPIC 04/03/2009  GERD (gastroesophageal reflux disease)   Guillain-Barre (HCC) 1988  HYPERLIPIDEMIA 03/06/2009  HYPERTENSION 07/15/2008  Stroke (HCC) 2010, 2011  x2   Vertebral artery stenosis  Past Surgical History: Past Surgical History: Procedure Laterality Date  ABDOMINAL HYSTERECTOMY    BIOPSY  07/08/2022  Procedure: BIOPSY;  Surgeon: Benancio Deeds, MD;  Location: [facility];  Service: Gastroenterology;;  COLONOSCOPY WITH PROPOFOL N/A 07/08/2022  Procedure: COLONOSCOPY WITH PROPOFOL;  Surgeon: Benancio Deeds, MD;  Location: [facility];  Service: Gastroenterology;  Laterality: N/A;  DILATION AND CURETTAGE OF UTERUS    FOOT SURGERY    IR ANGIO INTRA EXTRACRAN SEL COM CAROTID INNOMINATE UNI L MOD SED  09/17/2021  IR ANGIO INTRA EXTRACRAN SEL INTERNAL CAROTID UNI R MOD SED  09/17/2021  IR ANGIO VERTEBRAL SEL VERTEBRAL UNI R MOD SED  09/17/2021  IR US GUIDE VASC ACCESS RIGHT  09/17/2021 DeBlois, Riley Nearing 09/25/2022, 11:51 AM  US Abdomen Limited RUQ (LIVER/GB)  Result Date: 09/24/2022 CLINICAL DATA:  Gallbladder wall thickening. EXAM: ULTRASOUND ABDOMEN LIMITED RIGHT UPPER QUADRANT COMPARISON:  08/26/2022. FINDINGS: Gallbladder: The gallbladder is distended with sludge. No gallbladder wall thickening. No sonographic Murphy sign noted by sonographer. Common bile duct: Diameter: 5.3 mm Liver: No focal lesion identified.  Within normal limits in parenchymal echogenicity. Portal vein is patent on color Doppler imaging with normal direction of blood flow towards the liver. Other: No free fluid. IMPRESSION: Distended gallbladder with sludge. No evidence of acute cholecystitis. Electronically Signed   By: Thornell Sartorius M.D.   On: 09/24/2022 04:46   CT Angio Chest PE W/Cm &/Or Wo Cm  Result Date: 09/24/2022 CLINICAL DATA:  Pulmonary embolism (PE) suspected, high prob hypoxic, tachypneic pt -- some rhonchi but concern for PE given her sx and clear CXR EXAM: CT ANGIOGRAPHY CHEST WITH CONTRAST TECHNIQUE: Multidetector CT imaging of the chest was performed using the standard protocol during bolus administration of intravenous contrast. Multiplanar CT image reconstructions and MIPs were obtained to evaluate the vascular anatomy. RADIATION DOSE REDUCTION: This exam was performed according to the departmental dose-optimization program which includes automated exposure control, adjustment of the mA and/or kV according to patient size and/or use of iterative reconstruction technique. CONTRAST:  75mL OMNIPAQUE IOHEXOL 350 MG/ML SOLN COMPARISON:  05/29/2022. FINDINGS: Cardiovascular: No filling defects in the pulmonary arteries to suggest pulmonary emboli. Heart borderline in size. Aorta normal caliber. Scattered aortic calcifications. Retroesophageal right subclavian artery. Mediastinum/Nodes: Fall bulky mediastinal adenopathy again noted, worsening since prior study. Index left axillary lymph node has a short axis diameter of 2.9 cm compared to 2.5  cm previously. Index right hilar lymph node has a short axis diameter of 2.8 cm compared with 1.6 cm previously. Increasing mediastinal lymph node size. Subcarinal lymph node has a short axis diameter of 15 mm compared to 10 mm previously. Prevascular lymph node has a short axis diameter of 8 mm compared to 5 mm previously. Trachea and esophagus are unremarkable. Thyroid unremarkable. Lungs/Pleura:  Ground-glass airspace opacities in the perihilar regions, left slightly greater than right. Favor asymmetric edema although infection cannot be excluded. No effusions. Upper Abdomen: Gallbladder appears distended with possible wall thickening and stones within the gallbladder. Low-density throughout the liver suggesting fatty infiltration. Musculoskeletal: Chest wall soft tissues are unremarkable. No acute bony abnormality. Review of the MIP images confirms the above findings. IMPRESSION: Worsening bulky bilateral axillary adenopathy concerning for lymphoma. Borderline sized mediastinal lymph nodes. Borderline heart size. Scattered ground-glass predominately perihilar airspace opacities, left greater than right could reflect early asymmetric edema or infection. Gallbladder appears distended with possible wall thickening and internal stones. Consider further evaluation with right upper quadrant ultrasound. Aortic atherosclerosis. Electronically Signed   By: Charlett Nose M.D.   On: 09/24/2022 01:19   DG Chest 2 View  Result Date: 09/23/2022 CLINICAL DATA:  Shortness of breath. Left greater than right rhonchi. Hypoxia. EXAM: CHEST - 2 VIEW COMPARISON:  Radiograph 08/26/2022. CT 05/29/2022 FINDINGS: Lung volumes are low. Heart size upper normal. Stable mediastinal contours. Atelectasis/scarring in the left mid lung. No acute airspace disease. Normal pulmonary vasculature. No pleural fluid or pneumothorax no acute osseous abnormalities are seen. IMPRESSION: Low lung volumes with atelectasis/scarring in the left mid lung. Electronically Signed   By: Narda Rutherford M.D.   On: 09/23/2022 23:05    Microbiology: Recent Results (from the past 240 hour(s))  Resp panel by RT-PCR (RSV, Flu A&B, Covid) Anterior Nasal Swab     Status: None   Collection Time: 09/23/22 11:07 PM   Specimen: Anterior Nasal Swab  Result Value Ref Range Status   SARS Coronavirus 2 by RT PCR NEGATIVE NEGATIVE Final   Influenza A by PCR  NEGATIVE NEGATIVE Final   Influenza B by PCR NEGATIVE NEGATIVE Final    Comment: (NOTE) The Xpert Xpress SARS-CoV-2/FLU/RSV plus assay is intended as an aid in the diagnosis of influenza from Nasopharyngeal swab specimens and should not be used as a sole basis for treatment. Nasal washings and aspirates are unacceptable for Xpert Xpress SARS-CoV-2/FLU/RSV testing.  Fact Sheet for Patients: BloggerCourse.com  Fact Sheet for Healthcare Providers: SeriousBroker.it  This test is not yet approved or cleared by the [place] FDA and has been authorized for detection and/or diagnosis of SARS-CoV-2 by FDA under an Emergency Use Authorization (EUA). This EUA will remain in effect (meaning this test can be used) for the duration of the COVID-19 declaration under Section 564(b)(1) of the Act, 21 U.S.C. section 360bbb-3(b)(1), unless the authorization is terminated or revoked.     Resp Syncytial Virus by PCR NEGATIVE NEGATIVE Final    Comment: (NOTE) Fact Sheet for Patients: BloggerCourse.com  Fact Sheet for Healthcare Providers: SeriousBroker.it  This test is not yet approved or cleared by the [place] FDA and has been authorized for detection and/or diagnosis of SARS-CoV-2 by FDA under an Emergency Use Authorization (EUA). This EUA will remain in effect (meaning this test can be used) for the duration of the COVID-19 declaration under Section 564(b)(1) of the Act, 21 U.S.C. section 360bbb-3(b)(1), unless the authorization is terminated or revoked.  Performed at [facility]  Lab, [address] [place], [place] [address]   MRSA Next Gen by PCR, Nasal     Status: None   Collection Time: 09/24/22  1:19 PM   Specimen: Nasal Mucosa; Nasal Swab  Result Value Ref Range Status   MRSA by PCR Next Gen NOT DETECTED NOT DETECTED Final    Comment: (NOTE) The GeneXpert MRSA Assay  (FDA approved for NASAL specimens only), is one component of a comprehensive MRSA colonization surveillance program. It is not intended to diagnose MRSA infection nor to guide or monitor treatment for MRSA infections. Test performance is not FDA approved in patients less than 67 years old. Performed at [facility], [address] [place], [place] [address]      Labs: Basic Metabolic Panel: Recent Labs  Lab 09/23/22 2237 09/24/22 0028 09/24/22 0642 09/26/22 0441 09/27/22 0302  NA 135 137 136 139 138  K 4.7 3.7 3.6 3.2* 3.5  CL 104  --  101 107 108  CO2 22  --  23 23 21*  GLUCOSE 141*  --  143* 138* 128*  BUN 18  --  16 8 8   CREATININE 0.96  --  0.90 0.78 0.81  CALCIUM 9.1  --  9.4 8.9 8.8*  MG  --   --  2.2  --   --    Liver Function Tests: Recent Labs  Lab 09/23/22 2237 09/24/22 0642  AST 101* 66*  ALT 124* 125*  ALKPHOS 106 109  BILITOT 0.9 0.6  PROT 6.7 7.0  ALBUMIN 2.9* 2.9*   No results for input(s): "LIPASE", "AMYLASE" in the last 168 hours. No results for input(s): "AMMONIA" in the last 168 hours. CBC: Recent Labs  Lab 09/23/22 2237 09/24/22 0028 09/24/22 0406 09/26/22 0441 09/27/22 0302  WBC 7.2  --  5.7 6.5 8.7  HGB 10.1* 10.5* 10.2* 10.2* 10.9*  HCT 31.4* 31.0* 31.1* 31.2* 32.8*  MCV 78.5*  --  78.3* 78.6* 79.4*  PLT 213  --  300 245 258   Cardiac Enzymes: No results for input(s): "CKTOTAL", "CKMB", "CKMBINDEX", "TROPONINI" in the last 168 hours. BNP: BNP (last 3 results) Recent Labs    08/21/22 0704 09/23/22 2237  BNP 48.1 94.2    ProBNP (last 3 results) No results for input(s): "PROBNP" in the last 8760 hours.  CBG: Recent Labs  Lab 09/26/22 1646 09/26/22 1945 09/27/22 0055 09/27/22 0604 09/27/22 0723  GLUCAP 119* 142* 113* 137* 125*       Signed:  Rhetta Mura MD   [facility] 09/27/2022, 11:27 AM

## 2022-09-27 NOTE — Progress Notes (Signed)
PSpeech Language Pathology Treatment: Dysphagia  Patient Details Name: [name] MRN: [id number] DOB: Jan 12, 1961 Today's Date: 09/27/2022 Time: 4098-1191 SLP Time Calculation (min) (ACUTE ONLY): 8 min  Assessment / Plan / Recommendation Clinical Impression  Pt seen for diet tolerance and pt/family education. Upon SLP entrance to room, pt consuming nectar-thick liquids via straw in reclined position. While no overt s/sx pharyngeal dysphagia were noted, pt/family educated re: results of recent MBSS, diet recommendations, and safe swallowing strategies/aspiration precautions. Pt repositioned and observed taking x2 additional sips of thin liquids via straw. Again, no overt s/sx pharyngeal dysphagia were noted. Further PO trials deferred as pt's NT's present and eager to clean pt up for imminent d/c. Recommend continuation of mech soft diet with nectar-thick liquids with safe swallowing strategies/aspiration precautions as outlined below. SLP to continue to f/u while pt in house. Pt would benefit from ongoing SLP services at d/c for dysphagia management.    HPI HPI: [name] is an 62 y.o. female   Admission for 62 y/o x med problems.   H/O NHL in remission.  Nausea / vomiting and dehydration   Generalized weakness.   Last night she developed cough and SOB.   90% o2 at RA.  CT chest revealed  Scattered ground-glass predominately  perihilar airspace opacities, left greater than right could reflect  early asymmetric edema or infection.  MBSS 09/25/22 - recommended Dysphagia 3 with Nectar-thick Liquids.      SLP Plan  Continue with current plan of care      Recommendations for follow up therapy are one component of a multi-disciplinary discharge planning process, led by the attending physician.  Recommendations may be updated based on patient status, additional functional criteria and insurance authorization.    Recommendations  Diet recommendations: Dysphagia 3 (mechanical  soft);Nectar-thick liquid Medication Administration: Crushed with puree Supervision: Staff to assist with self feeding;Full supervision/cueing for compensatory strategies Compensations: Minimize environmental distractions;Slow rate;Small sips/bites;Monitor for anterior loss Postural Changes and/or Swallow Maneuvers: Seated upright 90 degrees;Upright 30-60 min after meal                  Oral care BID;Staff/trained caregiver to provide oral care   Frequent or constant Supervision/Assistance Dysphagia, oropharyngeal phase (R13.12)     Continue with current plan of care    [name], M.S., CCC-SLP Speech-Language Pathologist Secure Chat Preferred  O: [phone]  [name]  09/27/2022, 1:47 PM

## 2022-09-27 NOTE — TOC Transition Note (Signed)
Transition of Care [facility]) - CM/SW Discharge Note   Patient Details  Name: [name] MRN: [id number] Date of Birth: 1960-05-10  Transition of Care [facility]) CM/SW Contact:  Estelene Carmack A [place], Theresia Majors Phone Number: 09/27/2022, 12:47 PM   Clinical Narrative:    CSW met pt at bedside. She is at [facility] for care. She is agreeable to return to facility. She said CSW could reach out to sister regarding discharge. No other needs identified.    Patient will DC to: [facility]  Anticipated DC date: 09/27/22  Family notified: Marina Gravel  Transport by: Sharin Mons      Per MD patient ready for DC to [facility]. RN, patient, patient's family, and facility notified of DC. Discharge Summary and FL2 sent to facility. RN to call report prior to discharge (Room 104A, [phone]). DC packet on chart. Ambulance transport requested for patient.     CSW will sign off for now as social work intervention is no longer needed. Please consult [place] again if new needs arise.   Final next level of care: Acute to Acute Transfer Barriers to Discharge: No Barriers Identified   Patient Goals and CMS Choice      Discharge Placement                Patient chooses bed at: [facility] Patient to be transferred to facility by: [facility] Name of family member notified: Marina Gravel Patient and family notified of of transfer: 09/27/22  Discharge Plan and Services Additional resources added to the After Visit Summary for                                       Social Determinants of Health (SDOH) Interventions SDOH Screenings   Food Insecurity: No Food Insecurity (09/24/2022)  Housing: Low Risk  (09/24/2022)  Transportation Needs: No Transportation Needs (09/24/2022)  Utilities: Not At Risk (09/24/2022)  Depression (PHQ2-9): Low Risk  (03/16/2022)  Tobacco Use: Low Risk  (09/23/2022)     Readmission Risk Interventions     No data to display

## 2022-09-27 NOTE — Progress Notes (Signed)
PHARMACIST - PHYSICIAN COMMUNICATION ? ?DR:   Samtani ? ?CONCERNING: IV to Oral Route Change Policy ? ?RECOMMENDATION: ?This patient is receiving protonix by the intravenous route.  Based on criteria approved by the Pharmacy and Therapeutics Committee, the intravenous medication(s) is/are being converted to the equivalent oral dose form(s). ? ? ?DESCRIPTION: ?These criteria include: ?The patient is eating (either orally or via tube) and/or has been taking other orally administered medications for a least 24 hours ?The patient has no evidence of active gastrointestinal bleeding or impaired GI absorption (gastrectomy, short bowel, patient on TNA or NPO). ? ?If you have questions about this conversion, please contact the Pharmacy Department  ?[]  ( [phone] )  [facility] ?[]  ( [phone] )  [facility] ?[x]  ( [phone] )  [place] ?[]  ( [phone] )  [facility] ?[]  ( [phone] )  [facility]  ? ? ? ?

## 2022-09-28 ENCOUNTER — Ambulatory Visit: Payer: Commercial Managed Care - HMO | Admitting: Neurology

## 2022-09-28 ENCOUNTER — Encounter: Payer: Self-pay | Admitting: Neurology

## 2022-09-28 ENCOUNTER — Telehealth: Payer: Self-pay

## 2022-09-28 NOTE — Transitions of Care (Post Inpatient/ED Visit) (Signed)
09/28/2022  Name: [name] MRN: [id number] DOB: 1960/06/30  Today's TOC FU Call Status: Today's TOC FU Call Status:: Successful TOC FU Call Competed TOC FU Call Complete Date: 09/28/22  Transition Care Management Follow-up Telephone Call Date of Discharge: 09/27/22 Discharge Facility: [facility]) Type of Discharge: Inpatient Admission Primary Inpatient Discharge Diagnosis:: hypoxeia How have you been since you were released from the hospital?: Better Any questions or concerns?: No  Items Reviewed: Did you receive and understand the discharge instructions provided?: Yes Medications obtained,verified, and reconciled?: Yes (Medications Reviewed) Any new allergies since your discharge?: No Dietary orders reviewed?: Yes  Medications Reviewed Today: Medications Reviewed Today     Reviewed by Estil Daft, CPhT (Pharmacy Technician) on 09/24/22 at 1007  Med List Status: Complete   Medication Order Taking? Sig Documenting Provider Last Dose Status Informant  acetaminophen (TYLENOL) 325 MG tablet [id number] Yes Take 650 mg by mouth every 4 (four) hours as needed for mild pain. Do not exceed 3000mg  in 24 hours [provider] 09/05/2022 Active Pharmacy Records, Nursing Home Medication Administration Guide (MAG)  Kipp Laurence MEDICATION [id number] Yes Medication Name: Carafate 50 mg suppositories: Insert 1 suppository rectally twice a day for 2 weeks Javier Glazier 09/23/2022 Active Nursing Home Medication Administration Guide (MAG), Pharmacy Records  AMBULATORY Clent Demark MEDICATION [id number] No Medication Name: Diltiazem 2%/Lidocaine 2%   Using your index finger apply a small amount of medication inside the anal opening and to the external anal area twice daily x 6 weeks.  Patient not taking: Reported on 09/24/2022   Doree Albee, PA-C Not Taking Active Nursing Home Medication Administration Guide (MAG), Pharmacy Records  amLODipine [facility])  10 MG tablet [id number] Yes Take 1 tablet by mouth once daily [place], [name], MD 09/23/2022 am Active Pharmacy Records, Nursing Home Medication Administration Guide (MAG)  aspirin EC 81 MG tablet [id number] Yes Take 1 tablet (81 mg total) by mouth daily. Swallow whole. Osvaldo Shipper, MD 09/23/2022 am Active Pharmacy Records, Nursing Home Medication Administration Guide (MAG)  atorvastatin (LIPITOR) 80 MG tablet [id number] Yes Take 1 tablet (80 mg total) by mouth daily. [place], [name], MD 09/23/2022 pm Active Pharmacy Records, Nursing Home Medication Administration Guide (MAG)  B Complex Vitamins (VITAMIN B COMPLEX) TABS [id number] No Take 1 tablet by mouth every morning.  Patient not taking: Reported on 09/24/2022   [provider] Not Taking Active Pharmacy Records, Nursing Home Medication Administration Guide (MAG)  Blood Pressure Monitoring (BLOOD PRESSURE MONITOR AUTOMAT) DEVI [id number]  1 Device by Does not apply route daily. [place], [name], MD  Active Pharmacy Records, Nursing Home Medication Administration Guide (MAG)  Cholecalciferol 50 MCG [phone] UT) TABS [id number] Yes Take 2,000 Units by mouth daily. [provider] 09/23/2022 Active Nursing Home Medication Administration Guide (MAG), Pharmacy Records  ciprofloxacin (CIPRO) 500 MG tablet [id number] No Take 1 tablet (500 mg total) by mouth 2 (two) times daily.  Patient not taking: Reported on 09/24/2022   Linwood Dibbles, MD Not Taking Active Nursing Home Medication Administration Guide (MAG), Pharmacy Records  cyanocobalamin 1000 MCG tablet [id number] Yes Take 1,000 mcg by mouth daily. [provider] 09/23/2022 Active Nursing Home Medication Administration Guide (MAG), Pharmacy Records  docusate sodium (COLACE) 100 MG capsule [id number] Yes Take 100 mg by mouth 2 (two) times daily. [provider] 09/23/2022 pm Active Nursing Home Medication Administration Guide (MAG), Pharmacy Records  Dulaglutide (TRULICITY) 0.75  MG/0.5ML SOPN [id number] No Inject 0.75 mg into the skin  once a week.  Patient not taking: Reported on 09/06/2022   Shamleffer, Konrad Dolores, MD Not Taking Active Pharmacy Records, Nursing Home Medication Administration Guide (MAG)  ezetimibe (ZETIA) 10 MG tablet [id number] Yes Take 1 tablet (10 mg total) by mouth daily. [place], [name], MD 09/23/2022 Active Pharmacy Records, Nursing Home Medication Administration Guide (MAG)  FLUoxetine [facility]) 20 MG capsule [id number] Yes Take 1 capsule by mouth once daily [place], [name], MD 09/23/2022 Active Pharmacy Records, Nursing Home Medication Administration Guide (MAG)  glucose blood (CONTOUR NEXT TEST) test strip [id number]  1 each by Other route 2 (two) times daily. And lancets 2/day Romero Belling, MD  Active Pharmacy Records, Nursing Home Medication Administration Guide (MAG)  glucose blood [facility] VERIO) test strip [id number]  USE TO CHECK BLOOD SUGAR TWICE A DAY AND PRN Gordy Savers, MD  Active Pharmacy Records, Nursing Home Medication Administration Guide (MAG)  hydrALAZINE (APRESOLINE) 50 MG tablet [id number] Yes TAKE 1 TABLET BY MOUTH THREE TIMES DAILY [place], [name], MD 09/23/2022 2100 Active Pharmacy Records, Nursing Home Medication Administration Guide (MAG)  HYDROcodone-acetaminophen (NORCO/VICODIN) 5-325 MG tablet [id number] No Take 1 tablet by mouth every 6 (six) hours as needed.  Patient not taking: Reported on 09/24/2022   Linwood Dibbles, MD Not Taking Active Nursing Home Medication Administration Guide (MAG), Pharmacy Records  hydrocortisone (ANUSOL-HC) 25 MG suppository [id number] Yes Place 25 mg rectally 2 (two) times daily as needed for hemorrhoids or anal itching. [provider] Unknown Active Nursing Home Medication Administration Guide [facility]), Pharmacy Records           Med Note Erin Fulling, [facility]   Sat Sep 24, 2022 10:02 AM) Last dose not noted on pt MAR.  hydrOXYzine (ATARAX) 50 MG tablet [id number] Yes Take 50 mg by mouth 2  (two) times daily. [provider] 09/23/2022 2000 Active Pharmacy Records, Nursing Home Medication Administration Guide (MAG)  insulin aspart (NOVOLOG) 100 UNIT/ML injection [id number] Yes Inject 0-9 Units into the skin 3 (three) times daily with meals. 0-9 Units, Subcutaneous, 3 times daily with meals, First dose on Thu 07/07/22 at 0800 Correction coverage: Sensitive (thin, NPO, renal) CBG < 70: Implement Hypoglycemia Standing Orders and refer to Hypoglycemia Standing Orders sidebar report CBG 70 - 120: 0 units CBG 121 - 150: 1 unit CBG 151 - 200: 2 units CBG 201 - 250: 3 units CBG 251 - 300: 5 units CBG 301 - 350: 7 units CBG 351 - 400: 9 units CBG > 400: call MD. Elease Etienne, MD 09/23/2022 Active Pharmacy Records, Nursing Home Medication Administration Guide (MAG)  insulin glargine-yfgn (SEMGLEE) 100 UNIT/ML injection [id number] No Inject 0.05 mLs (5 Units total) into the skin daily.  Patient not taking: Reported on 09/24/2022   Elease Etienne, MD Not Taking Active Pharmacy Records, Nursing Home Medication Administration Guide (MAG)  LANTUS SOLOSTAR 100 UNIT/ML Solostar Pen [id number] Yes Inject 5 Units into the skin in the morning. [provider] 09/23/2022 Active Nursing Home Medication Administration Guide (MAG), Pharmacy Records  Lidocaine 5 % CREA [id number] Yes Apply 1 Application topically every 6 (six) hours as needed (rectal pain). [provider] 09/08/2022 Active Nursing Home Medication Administration Guide (MAG), Pharmacy Records  loperamide (IMODIUM) 2 MG capsule [id number] Yes Take 2 capsules (4 mg total) by mouth 3 (three) times daily as needed for diarrhea or loose stools. Osvaldo Shipper, MD Unknown Active Pharmacy Records, Nursing Home Medication Administration Guide (MAG)           Med  Note Erin Fulling, [facility]   Sat Sep 24, 2022 10:02 AM) Last dose not noted on pt MAR.  loratadine (CLARITIN) 10 MG tablet [id number]  Take 10 mg by mouth daily. [provider]  Active Nursing Home Medication Administration Guide [facility]), Pharmacy Records           Med Note Estil Daft   [facility] Sep 24, 2022  9:28 AM) Start date was set for 09/24/2022  Menthol, Topical Analgesic, (BIOFREEZE) 4 % GEL [id number] Yes Apply 1 Application topically in the morning. Neck and shoulders. [provider] 09/23/2022 Active Nursing Home Medication Administration Guide (MAG), Pharmacy Records  mesalamine (CANASA) 1000 MG suppository [id number] Yes Place 1,000 mg rectally at bedtime. [provider] 09/22/2022 Active Nursing Home Medication Administration Guide (MAG), Pharmacy Records  mesalamine (ROWASA) 4 g enema [id number] Yes Place 60 mLs (4 g total) rectally at bedtime. Lay on left side, retain as long as able Doree Albee, PA-C 09/23/2022 pm Active Nursing Home Medication Administration Guide (MAG), Pharmacy Records  methocarbamol (ROBAXIN) 500 MG tablet [id number] Yes Take 1 tablet (500 mg total) by mouth 2 (two) times daily. Pamala Duffel 09/23/2022 Active Nursing Home Medication Administration Guide (MAG), Pharmacy Records  metroNIDAZOLE (FLAGYL) 500 MG tablet [id number] No Take 1 tablet (500 mg total) by mouth 2 (two) times daily.  Patient not taking: Reported on 09/24/2022   Linwood Dibbles, MD Not Taking Active Nursing Home Medication Administration Guide (MAG), Pharmacy Records  omeprazole (PRILOSEC OTC) 20 MG tablet [id number] Yes Take 20 mg by mouth daily. [provider] 09/23/2022 Active Pharmacy Records, Nursing Home Medication Administration Guide (MAG)  ONE TOUCH LANCETS MISC [id number]  USE TO CHECK BLOOD SUGAR TWICE A DAY AND PRN Gordy Savers, MD  Active Pharmacy Records, Nursing Home Medication Administration Guide (MAG)  polyethylene glycol (MIRALAX / GLYCOLAX) 17 g packet [id number] Yes Take 17 g by mouth 2 (two) times daily. [provider] 09/23/2022 Active Nursing Home Medication Administration Guide (MAG),  Pharmacy Records  saccharomyces boulardii (FLORASTOR) 250 MG capsule [id number] Yes Take 1 capsule (250 mg total) by mouth 2 (two) times daily. Osvaldo Shipper, MD 09/23/2022 am Active Pharmacy Records, Nursing Home Medication Administration Guide (MAG)  ticagrelor (BRILINTA) 90 MG TABS tablet [id number] Yes Take 1 tablet (90 mg total) by mouth 2 (two) times daily. Osvaldo Shipper, MD 09/23/2022 2100 Active Pharmacy Records, Nursing Home Medication Administration Guide (MAG)           Med Note Luster Landsberg, TIFFANI S   Mon Aug 22, 2022 11:21 AM)    tiZANidine (ZANAFLEX) 2 MG tablet [id number] No Take 1 tablet by mouth every 8 (eight) hours as needed for muscle spasms.  Patient not taking: Reported on 09/24/2022   [provider] Not Taking Active Pharmacy Records, Nursing Home Medication Administration Guide (MAG)  traMADol (ULTRAM) 50 MG tablet [id number] Yes Take 25 mg by mouth every 6 (six) hours as needed for moderate pain or severe pain. [provider] 09/23/2022 2000 Active Pharmacy Records, Nursing Home Medication Administration Guide (MAG)  Med List Note Larrie Kass, CPhT 09/24/22 0301): [facility] [phone]            Home Care and Equipment/Supplies: Were Home Health Services Ordered?: NA Any new equipment or medical supplies ordered?: NA  Functional Questionnaire: Do you need assistance with bathing/showering or dressing?: No Do you need assistance with meal preparation?: No Do you need assistance with eating?: No Do  you have difficulty maintaining continence: No Do you need assistance with getting out of bed/getting out of a chair/moving?: Yes Do you have difficulty managing or taking your medications?: No  Follow up appointments reviewed: PCP Follow-up appointment confirmed?: NA (patient in rehab) Specialist Hospital Follow-up appointment confirmed?: NA Do you need transportation to your follow-up appointment?: No Do you  understand care options if your condition(s) worsen?: Yes-patient verbalized understanding    SIGNATURE Karena Addison, LPN [facility] Nurse Health Advisor Direct Dial [phone]

## 2022-09-29 ENCOUNTER — Telehealth: Payer: Self-pay | Admitting: *Deleted

## 2022-09-29 NOTE — Telephone Encounter (Signed)
Patient is already scheduled to see [name], PA-C on 09/30/22 in the office.

## 2022-09-29 NOTE — Progress Notes (Deleted)
09/29/2022 [name] [id number] [birth year]  Referring provider: [place], [name], MD Primary GI doctor: Dr. 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     Patient Care Team: [place], [name], MD as PCP - General (Family Medicine) Glendale Chard, DO as Consulting Physician (Neurology)  HISTORY OF PRESENT ILLNESS: 62 y.o. female with a past medical history of non-Hodgkin's lymphoma, PVD, hypertension, diabetes, hyperlipidemia, multiple strokes on Brillinta, Guillain-Barre syndrome, GERD and others listed below presents for evaluation of hospital follow-up for painless hematochezia.[name] She was in the hospital from 3/6 to 3/11 for painless hematochezia while on Brilinta.  07/08/2022 colonoscopy due to rectal bleeding and anemia on Brilinta shows 3 mm polyp ascending, 3 mm polyp descending, polypoid changes sigmoid colon, tortuous colon, poor anal tone difficult to insufflate left colon/rectum, few ulcers in distal rectum, internal hemorrhoids Pathology showed active proctitis/no evidence of chronicity, granuloma, dysplasia or malignancy.  Negative CMV Most suspicious for sterocoral ulcers, versus anal prolapse, less likely from infection/IBD with pathology. 08/31/2022 office visit with myself for proctitis seen on colonoscopy after hospitalization for rectal bleeding on Brilinta,  Patient never started on treatment prescribed in hospital, sent in mesalamine suppositories however this was denied so sent in enemas.  May be difficult with rectal tone, she feels these can do suppositories. Patient  complaining a lot of rectal spasm given diltiazem/lidocaine.   Her  sister brenda and niece are with her today.  Patient was suppose to be on Canasa and sucralfate suppositories outpatient but she never had.  She is on hydrocortisone suppository from PCP only, that is not helping. She has rectal pain every 15 mins, feels she has something in her rectum, feels very uncomfortable and then she can have severe rectal pain.  She is having hard Bms can be hard ball and soft, and pain with BM.  She denies any urinary symptoms.  She is suppose to be going to facility, [facility].    She  reports that she has never smoked. She has never used smokeless tobacco. She reports that she does not drink alcohol and does not use drugs.  RELEVANT LABS AND IMAGING: CBC    Component Value Date/Time   WBC 8.7 09/27/2022 0302   RBC 4.13 09/27/2022 0302   HGB 10.9 (L) 09/27/2022 0302   HGB 11.3 (L) 09/12/2022 1134   HCT 32.8 (L) 09/27/2022 0302   PLT 258 09/27/2022 0302   PLT 329 09/12/2022 1134   MCV 79.4 (L) 09/27/2022 0302   MCH 26.4 09/27/2022 0302   MCHC 33.2 09/27/2022 0302   RDW 15.8 (H) 09/27/2022 0302   LYMPHSABS 2.7 09/12/2022 1134   MONOABS 0.9 09/12/2022 1134   EOSABS 0.2 09/12/2022 1134   BASOSABS 0.0 09/12/2022 1134   Recent Labs    07/10/22 0113 07/11/22 0051 08/21/22 0703 08/26/22 2049 09/12/22 1134 09/23/22 2237 09/24/22 0028 09/24/22 0406 09/26/22 0441 09/27/22 0302  HGB 9.5* 10.0* 11.1* 11.1* 11.3* 10.1* 10.5* 10.2* 10.2* 10.9*    CMP     Component Value Date/Time   NA 138 09/27/2022 0302   K 3.5 09/27/2022 0302   CL 108 09/27/2022 0302   CO2 21 (L) 09/27/2022 0302  GLUCOSE 128 (H) 09/27/2022 0302   GLUCOSE 129 (H) 04/28/2006 0951   BUN 8 09/27/2022 0302   CREATININE 0.81 09/27/2022 0302   CREATININE 0.80 09/12/2022 1134   CALCIUM 8.8 (L) 09/27/2022 0302   PROT 7.0 09/24/2022 0642   ALBUMIN 2.9 (L) 09/24/2022 0642   AST 66 (H) 09/24/2022 0642   AST 31 09/12/2022 1134   ALT 125 (H) 09/24/2022 0642   ALT 46 (H)  09/12/2022 1134   ALKPHOS 109 09/24/2022 0642   BILITOT 0.6 09/24/2022 0642   BILITOT 0.4 09/12/2022 1134   GFRNONAA >60 09/27/2022 0302   GFRNONAA >60 09/12/2022 1134   GFRAA >60 01/10/2020 1103      Latest Ref Rng & Units 09/24/2022    6:42 AM 09/23/2022   10:37 PM 09/12/2022   11:34 AM  Hepatic Function  Total Protein 6.5 - 8.1 g/dL 7.0  6.7  7.2   Albumin 3.5 - 5.0 g/dL 2.9  2.9  3.6   AST 15 - 41 U/L 66  101  31   ALT 0 - 44 U/L 125  124  46   Alk Phosphatase 38 - 126 U/L 109  106  102   Total Bilirubin 0.3 - 1.2 mg/dL 0.6  0.9  0.4       Current Medications:   Current Outpatient Medications (Endocrine & Metabolic):    Dulaglutide (TRULICITY) 0.75 MG/0.5ML SOPN, Inject 0.75 mg into the skin once a week. (Patient not taking: Reported on 09/06/2022)   insulin glargine-yfgn (SEMGLEE) 100 UNIT/ML injection, Inject 0.05 mLs (5 Units total) into the skin daily. (Patient not taking: Reported on 09/24/2022)  Current Outpatient Medications (Cardiovascular):    amLODipine (NORVASC) 10 MG tablet, Take 1 tablet by mouth once daily   atorvastatin (LIPITOR) 80 MG tablet, Take 1 tablet (80 mg total) by mouth daily.   ezetimibe (ZETIA) 10 MG tablet, Take 1 tablet (10 mg total) by mouth daily.   Current Outpatient Medications (Analgesics):    acetaminophen (TYLENOL) 325 MG tablet, Take 650 mg by mouth every 4 (four) hours as needed for mild pain. Do not exceed 3000mg  in 24 hours   aspirin EC 81 MG tablet, Take 1 tablet (81 mg total) by mouth daily. Swallow whole.  Current Outpatient Medications (Hematological):    cyanocobalamin 1000 MCG tablet, Take 1,000 mcg by mouth daily.   ticagrelor (BRILINTA) 90 MG TABS tablet, Take 1 tablet (90 mg total) by mouth 2 (two) times daily.  Current Outpatient Medications (Other):    AMBULATORY NON FORMULARY MEDICATION, Medication Name: Carafate 50 mg suppositories: Insert 1 suppository rectally twice a day for 2 weeks   AMBULATORY NON FORMULARY  MEDICATION, Medication Name: Diltiazem 2%/Lidocaine 2%   Using your index finger apply a small amount of medication inside the anal opening and to the external anal area twice daily x 6 weeks. (Patient not taking: Reported on 09/24/2022)   amoxicillin-clavulanate (AUGMENTIN) 875-125 MG tablet, Take 1 tablet by mouth every 12 (twelve) hours for 2 days.   B Complex Vitamins (VITAMIN B COMPLEX) TABS, Take 1 tablet by mouth every morning. (Patient not taking: Reported on 09/24/2022)   Blood Pressure Monitoring (BLOOD PRESSURE MONITOR AUTOMAT) DEVI, 1 Device by Does not apply route daily.   FLUoxetine (PROZAC) 20 MG capsule, Take 1 capsule (20 mg total) by mouth daily.   hydrocortisone (ANUSOL-HC) 25 MG suppository, Place 25 mg rectally 2 (two) times daily as needed for hemorrhoids or anal itching.   hydrOXYzine (ATARAX) 10 MG tablet,  Take 1 tablet (10 mg total) by mouth 3 (three) times daily as needed for anxiety.   Lidocaine 5 % CREA, Apply 1 Application topically every 6 (six) hours as needed (rectal pain).   Menthol, Topical Analgesic, (BIOFREEZE) 4 % GEL, Apply 1 Application topically in the morning. Neck and shoulders.   mesalamine (CANASA) 1000 MG suppository, Place 1,000 mg rectally at bedtime.   methocarbamol (ROBAXIN) 500 MG tablet, Take 1 tablet (500 mg total) by mouth 2 (two) times daily.   omeprazole (PRILOSEC OTC) 20 MG tablet, Take 20 mg by mouth daily.   ONE TOUCH LANCETS MISC, USE TO CHECK BLOOD SUGAR TWICE A DAY AND PRN   polyethylene glycol (MIRALAX / GLYCOLAX) 17 g packet, Take 17 g by mouth every other day.   saccharomyces boulardii (FLORASTOR) 250 MG capsule, Take 1 capsule (250 mg total) by mouth 2 (two) times daily.  Medical History:  Past Medical History:  Diagnosis Date   Abnormality of gait 05/10/2010   BACK PAIN 11/14/2008   Class 1 obesity due to excess calories with body mass index (BMI) of 31.0 to 31.9 in adult 02/07/2021   DIABETES MELLITUS, TYPE II 07/15/2008    Diplopia 07/15/2008   ECZEMA, ATOPIC 04/03/2009   GERD (gastroesophageal reflux disease)    Guillain-Barre (HCC) 1988   HYPERLIPIDEMIA 03/06/2009   HYPERTENSION 07/15/2008   Stroke (HCC) 2010, 2011   x2    Vertebral artery stenosis    Allergies: No Known Allergies   Surgical History:  She  has a past surgical history that includes Abdominal hysterectomy; Foot surgery; Dilation and curettage of uterus; IR ANGIO INTRA EXTRACRAN SEL INTERNAL CAROTID UNI R MOD SED (09/17/2021); IR US Guide Vasc Access Right (09/17/2021); IR ANGIO VERTEBRAL SEL VERTEBRAL UNI R MOD SED (09/17/2021); IR ANGIO INTRA EXTRACRAN SEL COM CAROTID INNOMINATE UNI L MOD SED (09/17/2021); Colonoscopy with propofol (N/A, 07/08/2022); and biopsy (07/08/2022). Family History:  Her family history includes Asthma in an other family member; Cancer in her father; Diabetes in her mother and sister; Hypertension in an other family member; Stroke in her mother and another family member.  REVIEW OF SYSTEMS  : All other systems reviewed and negative except where noted in the History of Present Illness.  PHYSICAL EXAM: There were no vitals taken for this visit. General Appearance: Chronically ill-appearing in no apparent distress. Head:   Normocephalic and atraumatic. Eyes:  sclerae anicteric,conjunctive pink  Respiratory: Respiratory effort normal, BS equal bilaterally without rales, rhonchi, wheezing. Cardio: RRR with no MRGs.  Abdomen: Soft,  Obese ,active bowel sounds. No tenderness . [name] No masses. Rectal: declines due to fall risk and getting out of wheelchair Musculoskeletal: Decreased strength left lower legs and arms, in wheelchair, gait not evaluated.   Skin:  Dry and intact without significant lesions or rashes Neuro: Alert and  oriented x4; mild dysarthria, decreased strength left upper and lower extremity. Psych:  Cooperative. Normal mood and flat   Doree Albee, PA-C 1:40 PM

## 2022-09-29 NOTE — Telephone Encounter (Signed)
===  View-only below this line=== ----- Message ----- From: Doree Albee, PA-C Sent: 09/27/2022  11:23 AM EDT To: Chrystie Nose, RN  Can we set up outpatient follow up for this patient with me ideally in 1-2 weeks? Or with her primary GI?

## 2022-09-30 ENCOUNTER — Ambulatory Visit: Payer: Commercial Managed Care - HMO | Admitting: Physician Assistant

## 2022-09-30 NOTE — Telephone Encounter (Signed)
Patient's sister called requesting a call back regarding appointment. States patient's transportation from facility she is at, arrived at 9:30 am and missed appointment. Patient's sister also requesting a refill for suppositories medication. Please advise, thank you.

## 2022-10-03 NOTE — Telephone Encounter (Signed)
Spoke with pts sister and rescheduled pt to see [name] NP tomorrow at 1:30pm. She is aware of appt.

## 2022-10-04 ENCOUNTER — Encounter: Payer: Self-pay | Admitting: Neurology

## 2022-10-04 ENCOUNTER — Ambulatory Visit (INDEPENDENT_AMBULATORY_CARE_PROVIDER_SITE_OTHER): Payer: Commercial Managed Care - HMO | Admitting: Neurology

## 2022-10-04 ENCOUNTER — Inpatient Hospital Stay (HOSPITAL_COMMUNITY)
Admission: EM | Admit: 2022-10-04 | Discharge: 2022-10-18 | DRG: 871 | Disposition: A | Discharge status: Skilled Nursing Facility | Payer: Commercial Managed Care - HMO | Attending: Family Medicine | Admitting: Family Medicine

## 2022-10-04 ENCOUNTER — Encounter: Payer: Self-pay | Admitting: Nurse Practitioner

## 2022-10-04 ENCOUNTER — Encounter (HOSPITAL_COMMUNITY): Payer: Self-pay

## 2022-10-04 ENCOUNTER — Emergency Department (HOSPITAL_COMMUNITY): Payer: Commercial Managed Care - HMO

## 2022-10-04 ENCOUNTER — Ambulatory Visit (INDEPENDENT_AMBULATORY_CARE_PROVIDER_SITE_OTHER): Payer: Commercial Managed Care - HMO | Admitting: Nurse Practitioner

## 2022-10-04 VITALS — BP 128/92 | HR 140

## 2022-10-04 VITALS — BP 138/84 | HR 110 | Ht 64.0 in | Wt 142.0 lb

## 2022-10-04 DIAGNOSIS — F32A Depression, unspecified: Secondary | ICD-10-CM | POA: Diagnosis present

## 2022-10-04 DIAGNOSIS — Z7984 Long term (current) use of oral hypoglycemic drugs: Secondary | ICD-10-CM | POA: Diagnosis not present

## 2022-10-04 DIAGNOSIS — E119 Type 2 diabetes mellitus without complications: Secondary | ICD-10-CM | POA: Diagnosis not present

## 2022-10-04 DIAGNOSIS — E876 Hypokalemia: Secondary | ICD-10-CM | POA: Diagnosis not present

## 2022-10-04 DIAGNOSIS — E1122 Type 2 diabetes mellitus with diabetic chronic kidney disease: Secondary | ICD-10-CM | POA: Diagnosis present

## 2022-10-04 DIAGNOSIS — Z8679 Personal history of other diseases of the circulatory system: Secondary | ICD-10-CM | POA: Diagnosis not present

## 2022-10-04 DIAGNOSIS — N19 Unspecified kidney failure: Secondary | ICD-10-CM | POA: Diagnosis not present

## 2022-10-04 DIAGNOSIS — J189 Pneumonia, unspecified organism: Secondary | ICD-10-CM | POA: Diagnosis not present

## 2022-10-04 DIAGNOSIS — K644 Residual hemorrhoidal skin tags: Secondary | ICD-10-CM | POA: Diagnosis not present

## 2022-10-04 DIAGNOSIS — E782 Mixed hyperlipidemia: Secondary | ICD-10-CM

## 2022-10-04 DIAGNOSIS — A419 Sepsis, unspecified organism: Secondary | ICD-10-CM | POA: Diagnosis not present

## 2022-10-04 DIAGNOSIS — N1831 Chronic kidney disease, stage 3a: Secondary | ICD-10-CM | POA: Diagnosis present

## 2022-10-04 DIAGNOSIS — Z823 Family history of stroke: Secondary | ICD-10-CM

## 2022-10-04 DIAGNOSIS — E43 Unspecified severe protein-calorie malnutrition: Secondary | ICD-10-CM | POA: Diagnosis present

## 2022-10-04 DIAGNOSIS — Z79899 Other long term (current) drug therapy: Secondary | ICD-10-CM | POA: Diagnosis not present

## 2022-10-04 DIAGNOSIS — N939 Abnormal uterine and vaginal bleeding, unspecified: Secondary | ICD-10-CM | POA: Diagnosis not present

## 2022-10-04 DIAGNOSIS — Z833 Family history of diabetes mellitus: Secondary | ICD-10-CM

## 2022-10-04 DIAGNOSIS — I10 Essential (primary) hypertension: Secondary | ICD-10-CM | POA: Diagnosis present

## 2022-10-04 DIAGNOSIS — K219 Gastro-esophageal reflux disease without esophagitis: Secondary | ICD-10-CM | POA: Diagnosis present

## 2022-10-04 DIAGNOSIS — R6520 Severe sepsis without septic shock: Secondary | ICD-10-CM | POA: Diagnosis present

## 2022-10-04 DIAGNOSIS — J690 Pneumonitis due to inhalation of food and vomit: Secondary | ICD-10-CM | POA: Diagnosis present

## 2022-10-04 DIAGNOSIS — Z794 Long term (current) use of insulin: Secondary | ICD-10-CM

## 2022-10-04 DIAGNOSIS — Z8249 Family history of ischemic heart disease and other diseases of the circulatory system: Secondary | ICD-10-CM

## 2022-10-04 DIAGNOSIS — N390 Urinary tract infection, site not specified: Secondary | ICD-10-CM | POA: Diagnosis present

## 2022-10-04 DIAGNOSIS — Z8572 Personal history of non-Hodgkin lymphomas: Secondary | ICD-10-CM | POA: Diagnosis not present

## 2022-10-04 DIAGNOSIS — K529 Noninfective gastroenteritis and colitis, unspecified: Secondary | ICD-10-CM | POA: Diagnosis not present

## 2022-10-04 DIAGNOSIS — K5641 Fecal impaction: Secondary | ICD-10-CM | POA: Diagnosis not present

## 2022-10-04 DIAGNOSIS — I69354 Hemiplegia and hemiparesis following cerebral infarction affecting left non-dominant side: Secondary | ICD-10-CM

## 2022-10-04 DIAGNOSIS — K633 Ulcer of intestine: Secondary | ICD-10-CM | POA: Diagnosis present

## 2022-10-04 DIAGNOSIS — I129 Hypertensive chronic kidney disease with stage 1 through stage 4 chronic kidney disease, or unspecified chronic kidney disease: Secondary | ICD-10-CM | POA: Diagnosis present

## 2022-10-04 DIAGNOSIS — R1310 Dysphagia, unspecified: Secondary | ICD-10-CM | POA: Diagnosis present

## 2022-10-04 DIAGNOSIS — K626 Ulcer of anus and rectum: Secondary | ICD-10-CM | POA: Diagnosis present

## 2022-10-04 DIAGNOSIS — Z9981 Dependence on supplemental oxygen: Secondary | ICD-10-CM

## 2022-10-04 DIAGNOSIS — R7401 Elevation of levels of liver transaminase levels: Secondary | ICD-10-CM | POA: Diagnosis present

## 2022-10-04 DIAGNOSIS — K6289 Other specified diseases of anus and rectum: Secondary | ICD-10-CM

## 2022-10-04 DIAGNOSIS — I639 Cerebral infarction, unspecified: Secondary | ICD-10-CM | POA: Diagnosis not present

## 2022-10-04 DIAGNOSIS — I679 Cerebrovascular disease, unspecified: Secondary | ICD-10-CM

## 2022-10-04 DIAGNOSIS — Z6825 Body mass index (BMI) 25.0-25.9, adult: Secondary | ICD-10-CM

## 2022-10-04 DIAGNOSIS — I69391 Dysphagia following cerebral infarction: Secondary | ICD-10-CM

## 2022-10-04 DIAGNOSIS — R471 Dysarthria and anarthria: Secondary | ICD-10-CM | POA: Diagnosis present

## 2022-10-04 DIAGNOSIS — K5130 Ulcerative (chronic) rectosigmoiditis without complications: Secondary | ICD-10-CM | POA: Diagnosis not present

## 2022-10-04 DIAGNOSIS — R8281 Pyuria: Secondary | ICD-10-CM | POA: Diagnosis not present

## 2022-10-04 DIAGNOSIS — E860 Dehydration: Secondary | ICD-10-CM | POA: Diagnosis present

## 2022-10-04 DIAGNOSIS — Z7985 Long-term (current) use of injectable non-insulin antidiabetic drugs: Secondary | ICD-10-CM

## 2022-10-04 DIAGNOSIS — E785 Hyperlipidemia, unspecified: Secondary | ICD-10-CM | POA: Diagnosis present

## 2022-10-04 DIAGNOSIS — Z7982 Long term (current) use of aspirin: Secondary | ICD-10-CM

## 2022-10-04 DIAGNOSIS — E1165 Type 2 diabetes mellitus with hyperglycemia: Secondary | ICD-10-CM | POA: Diagnosis not present

## 2022-10-04 DIAGNOSIS — Z7902 Long term (current) use of antithrombotics/antiplatelets: Secondary | ICD-10-CM

## 2022-10-04 LAB — BRAIN NATRIURETIC PEPTIDE: B Natriuretic Peptide: 45.3 pg/mL (ref 0.0–100.0)

## 2022-10-04 LAB — CBC
HCT: 39.2 % (ref 36.0–46.0)
Hemoglobin: 12.6 g/dL (ref 12.0–15.0)
MCH: 25.4 pg — ABNORMAL LOW (ref 26.0–34.0)
MCHC: 32.1 g/dL (ref 30.0–36.0)
MCV: 79 fL — ABNORMAL LOW (ref 80.0–100.0)
Platelets: 393 10*3/uL (ref 150–400)
RBC: 4.96 MIL/uL (ref 3.87–5.11)
RDW: 16.2 % — ABNORMAL HIGH (ref 11.5–15.5)
WBC: 14.7 10*3/uL — ABNORMAL HIGH (ref 4.0–10.5)
nRBC: 0 % (ref 0.0–0.2)

## 2022-10-04 LAB — HEPATIC FUNCTION PANEL
ALT: 180 U/L — ABNORMAL HIGH (ref 0–44)
AST: 94 U/L — ABNORMAL HIGH (ref 15–41)
Albumin: 3.2 g/dL — ABNORMAL LOW (ref 3.5–5.0)
Alkaline Phosphatase: 152 U/L — ABNORMAL HIGH (ref 38–126)
Bilirubin, Direct: <0.1 mg/dL (ref 0.0–0.2)
Total Bilirubin: 0.5 mg/dL (ref 0.3–1.2)
Total Protein: 8.1 g/dL (ref 6.5–8.1)

## 2022-10-04 LAB — URINALYSIS, W/ REFLEX TO CULTURE (INFECTION SUSPECTED)
Bacteria, UA: NONE SEEN
Bilirubin Urine: NEGATIVE
Glucose, UA: 50 mg/dL — AB
Ketones, ur: NEGATIVE mg/dL
Nitrite: NEGATIVE
Protein, ur: 100 mg/dL — AB
RBC / HPF: >50 RBC/hpf (ref 0–5)
Specific Gravity, Urine: 1.023 (ref 1.005–1.030)
WBC, UA: >50 WBC/hpf (ref 0–5)
pH: 7 (ref 5.0–8.0)

## 2022-10-04 LAB — MAGNESIUM: Magnesium: 2 mg/dL (ref 1.7–2.4)

## 2022-10-04 LAB — BASIC METABOLIC PANEL
Anion gap: 13 (ref 5–15)
BUN: 20 mg/dL (ref 8–23)
CO2: 21 mmol/L — ABNORMAL LOW (ref 22–32)
Calcium: 10.1 mg/dL (ref 8.9–10.3)
Chloride: 102 mmol/L (ref 98–111)
Creatinine, Ser: 0.98 mg/dL (ref 0.44–1.00)
GFR, Estimated: >60 mL/min (ref >60)
Glucose, Bld: 231 mg/dL — ABNORMAL HIGH (ref 70–99)
Potassium: 4.2 mmol/L (ref 3.5–5.1)
Sodium: 136 mmol/L (ref 135–145)

## 2022-10-04 LAB — TROPONIN I (HIGH SENSITIVITY)
Troponin I (High Sensitivity): 15 ng/L (ref <18)
Troponin I (High Sensitivity): 14 ng/L (ref <18)

## 2022-10-04 LAB — LACTIC ACID, PLASMA
Lactic Acid, Venous: 2.9 mmol/L (ref 0.5–1.9)
Lactic Acid, Venous: 1.5 mmol/L (ref 0.5–1.9)

## 2022-10-04 MED ORDER — ACETAMINOPHEN 325 MG PO TABS
650.0000 mg | ORAL_TABLET | Freq: Four times a day (QID) | ORAL | Status: DC | PRN
  Administered 2022-10-05 – 2022-10-15 (×3): 650 mg via ORAL
  Filled 2022-10-04 (×3): qty 2

## 2022-10-04 MED ORDER — METRONIDAZOLE 500 MG/100ML IV SOLN
500.0000 mg | Freq: Two times a day (BID) | INTRAVENOUS | Status: DC
  Administered 2022-10-05 – 2022-10-07 (×5): 500 mg via INTRAVENOUS
  Filled 2022-10-04 (×5): qty 100

## 2022-10-04 MED ORDER — ONDANSETRON HCL 4 MG/2ML IJ SOLN
4.0000 mg | Freq: Four times a day (QID) | INTRAMUSCULAR | Status: DC | PRN
  Administered 2022-10-05 – 2022-10-07 (×3): 4 mg via INTRAVENOUS
  Filled 2022-10-04 (×3): qty 2

## 2022-10-04 MED ORDER — ONDANSETRON 4 MG PO TBDP
ORAL_TABLET | ORAL | Status: AC
  Administered 2022-10-04: 4 mg via ORAL
  Filled 2022-10-04: qty 1

## 2022-10-04 MED ORDER — SODIUM CHLORIDE 0.9 % IV SOLN
1.0000 g | INTRAVENOUS | Status: DC
  Administered 2022-10-05 – 2022-10-09 (×5): 1 g via INTRAVENOUS
  Filled 2022-10-04 (×5): qty 10

## 2022-10-04 MED ORDER — TIZANIDINE HCL 2 MG PO TABS: 2.0000 mg | ORAL_TABLET | Freq: Two times a day (BID) | ORAL | 5 refills | Status: DC

## 2022-10-04 MED ORDER — ACETAMINOPHEN 650 MG RE SUPP: 650.0000 mg | Freq: Four times a day (QID) | RECTAL | Status: DC | PRN

## 2022-10-04 MED ORDER — LACTATED RINGERS IV BOLUS
1000.0000 mL | Freq: Once | INTRAVENOUS | Status: AC
  Administered 2022-10-04: 1000 mL via INTRAVENOUS

## 2022-10-04 MED ORDER — METRONIDAZOLE 500 MG/100ML IV SOLN
500.0000 mg | Freq: Once | INTRAVENOUS | Status: AC
  Administered 2022-10-04: 500 mg via INTRAVENOUS
  Filled 2022-10-04: qty 100

## 2022-10-04 MED ORDER — LACTATED RINGERS IV SOLN: INTRAVENOUS | Status: AC

## 2022-10-04 MED ORDER — MELATONIN 3 MG PO TABS
3.0000 mg | ORAL_TABLET | Freq: Every evening | ORAL | Status: DC | PRN
  Administered 2022-10-05 – 2022-10-15 (×3): 3 mg via ORAL
  Filled 2022-10-04 (×3): qty 1

## 2022-10-04 MED ORDER — SODIUM CHLORIDE 0.9 % IV SOLN
500.0000 mg | INTRAVENOUS | Status: DC
  Administered 2022-10-04 – 2022-10-05 (×2): 500 mg via INTRAVENOUS
  Filled 2022-10-04 (×3): qty 5

## 2022-10-04 MED ORDER — IOHEXOL 350 MG/ML SOLN
75.0000 mL | Freq: Once | INTRAVENOUS | Status: AC | PRN
  Administered 2022-10-04: 75 mL via INTRAVENOUS

## 2022-10-04 MED ORDER — ONDANSETRON 4 MG PO TBDP: 4.0000 mg | ORAL_TABLET | Freq: Once | ORAL | Status: AC

## 2022-10-04 MED ORDER — SODIUM CHLORIDE 0.9 % IV SOLN
2.0000 g | Freq: Once | INTRAVENOUS | Status: AC
  Administered 2022-10-04: 2 g via INTRAVENOUS
  Filled 2022-10-04: qty 20

## 2022-10-04 NOTE — ED Provider Notes (Signed)
[place] [facility] AT [facility] Provider Note   CSN: [id number] Arrival date & time: 10/04/22  1529     History  Chief Complaint  Patient presents with   Emesis   Chest Pain    [name] is a 62 y.o. female.   Emesis Chest Pain Associated symptoms: vomiting   Patient presents from GI.  Tachycardia.  Feeling bad.  Patient somewhat difficult to understand difficult getting history from.  Some diaphoresis and tachycardia.  Has had reportedly been vomiting.    Past Medical History:  Diagnosis Date   Abnormality of gait 05/10/2010   BACK PAIN 11/14/2008   Class 1 obesity due to excess calories with body mass index (BMI) of 31.0 to 31.9 in adult 02/07/2021   DIABETES MELLITUS, TYPE II 07/15/2008   Diplopia 07/15/2008   ECZEMA, ATOPIC 04/03/2009   GERD (gastroesophageal reflux disease)    Guillain-Barre (HCC) 1988   HYPERLIPIDEMIA 03/06/2009   HYPERTENSION 07/15/2008   Stroke (HCC) 2010, 2011   x2    Vertebral artery stenosis     Home Medications Prior to Admission medications   Medication Sig Start Date End Date Taking? Authorizing Provider  acetaminophen (TYLENOL) 325 MG tablet Take 650 mg by mouth every 4 (four) hours as needed for mild pain. Do not exceed 3000mg  in 24 hours    [provider]  AMBULATORY NON FORMULARY MEDICATION Medication Name: Carafate 50 mg suppositories: Insert 1 suppository rectally twice a day for 2 weeks 08/31/22   Doree Albee, PA-C  AMBULATORY NON FORMULARY MEDICATION Medication Name: Diltiazem 2%/Lidocaine 2%   Using your index finger apply a small amount of medication inside the anal opening and to the external anal area twice daily x 6 weeks. Patient not taking: Reported on 09/24/2022 08/31/22   Doree Albee, PA-C  amLODipine (NORVASC) 10 MG tablet Take 1 tablet by mouth once daily 05/09/22   [place], [name], MD  aspirin EC 81 MG tablet Take 1 tablet (81 mg total) by mouth daily. Swallow whole.  06/18/22   Osvaldo Shipper, MD  atorvastatin (LIPITOR) 80 MG tablet Take 1 tablet (80 mg total) by mouth daily. 05/09/22   [place], [name], MD  B Complex Vitamins (VITAMIN B COMPLEX) TABS Take 1 tablet by mouth every morning.    [provider]  Blood Pressure Monitoring (BLOOD PRESSURE MONITOR AUTOMAT) DEVI 1 Device by Does not apply route daily. 08/29/18   [place], [name], MD  cyanocobalamin 1000 MCG tablet Take 1,000 mcg by mouth daily.    [provider]  Dulaglutide (TRULICITY) 0.75 MG/0.5ML SOPN Inject 0.75 mg into the skin once a week. 04/13/22   Shamleffer, Konrad Dolores, MD  ezetimibe (ZETIA) 10 MG tablet Take 1 tablet (10 mg total) by mouth daily. 05/09/22   [place], [name], MD  FLUoxetine (PROZAC) 20 MG capsule Take 1 capsule (20 mg total) by mouth daily. 09/27/22   Rhetta Mura, MD  hydrocortisone (ANUSOL-HC) 25 MG suppository Place 25 mg rectally 2 (two) times daily as needed for hemorrhoids or anal itching.    [provider]  hydrOXYzine (ATARAX) 10 MG tablet Take 1 tablet (10 mg total) by mouth 3 (three) times daily as needed for anxiety. 09/27/22   Rhetta Mura, MD  insulin glargine-yfgn (SEMGLEE) 100 UNIT/ML injection Inject 0.05 mLs (5 Units total) into the skin daily. 07/12/22   Hongalgi, Maximino Greenland, MD  Lidocaine 5 % CREA Apply 1 Application topically every 6 (six) hours as  needed (rectal pain).    [provider]  Menthol, Topical Analgesic, (BIOFREEZE) 4 % GEL Apply 1 Application topically in the morning. Neck and shoulders.    [provider]  mesalamine (CANASA) 1000 MG suppository Place 1,000 mg rectally at bedtime.    [provider]  methocarbamol (ROBAXIN) 500 MG tablet Take 500 mg by mouth 2 (two) times daily.    [provider]  omeprazole (PRILOSEC OTC) 20 MG tablet Take 20 mg by mouth daily.    [provider]  ONE TOUCH LANCETS MISC USE TO CHECK BLOOD SUGAR TWICE A DAY AND PRN 07/29/15    Gordy Savers, MD  OXYGEN Inhale 2 L/min into the lungs as needed.    [provider]  polyethylene glycol (MIRALAX / GLYCOLAX) 17 g packet Take 17 g by mouth every other day. 09/27/22   Rhetta Mura, MD  saccharomyces boulardii (FLORASTOR) 250 MG capsule Take 1 capsule (250 mg total) by mouth 2 (two) times daily. 06/17/22   Osvaldo Shipper, MD  ticagrelor (BRILINTA) 90 MG TABS tablet Take 90 mg by mouth 2 (two) times daily.    [provider]  tiZANidine (ZANAFLEX) 2 MG tablet Take 1 tablet (2 mg total) by mouth 2 (two) times daily. 10/04/22   Nita Sickle K, DO      Allergies    Patient has no known allergies.    Review of Systems   Review of Systems  Cardiovascular:  Positive for chest pain.  Gastrointestinal:  Positive for vomiting.    Physical Exam Updated Vital Signs BP (!) 158/100   Pulse (!) 118   Temp 98.3 F (36.8 C) (Oral)   Resp (!) 25   SpO2 100%  Physical Exam Vitals reviewed.  Cardiovascular:     Rate and Rhythm: Tachycardia present.  Pulmonary:     Breath sounds: No wheezing or rhonchi.  Chest:     Chest wall: No tenderness.  Musculoskeletal:     Right lower leg: No tenderness.     Left lower leg: No tenderness.  Skin:    General: Skin is warm.  Neurological:     Mental Status: She is alert.     ED Results / Procedures / Treatments   Labs (all labs ordered are listed, but only abnormal results are displayed) Labs Reviewed  BASIC METABOLIC PANEL - Abnormal; Notable for the following components:      Result Value   CO2 21 (*)    Glucose, Bld 231 (*)    All other components within normal limits  CBC - Abnormal; Notable for the following components:   WBC 14.7 (*)    MCV 79.0 (*)    MCH 25.4 (*)    RDW 16.2 (*)    All other components within normal limits  LACTIC ACID, PLASMA - Abnormal; Notable for the following components:   Lactic Acid, Venous 2.9 (*)    All other components within normal limits  HEPATIC  FUNCTION PANEL - Abnormal; Notable for the following components:   Albumin 3.2 (*)    AST 94 (*)    ALT 180 (*)    Alkaline Phosphatase 152 (*)    All other components within normal limits  CULTURE, BLOOD (ROUTINE X 2)  CULTURE, BLOOD (ROUTINE X 2)  BRAIN NATRIURETIC PEPTIDE  LACTIC ACID, PLASMA  URINALYSIS, W/ REFLEX TO CULTURE (INFECTION SUSPECTED)  TROPONIN I (HIGH SENSITIVITY)  TROPONIN I (HIGH SENSITIVITY)    EKG EKG Interpretation  Date/Time:  Tuesday  October 04 2022 15:43:39 EDT Ventricular Rate:  147 PR Interval:  124 QRS Duration: 74 QT Interval:  264 QTC Calculation: 413 R Axis:   47 Text Interpretation: Sinus tachycardia Minimal voltage criteria for LVH, may be normal variant ( Sokolow-Lyon ) ST & T wave abnormality, consider inferolateral ischemia Abnormal ECG When compared with ECG of 23-Sep-2022 22:16, rate increases Confirmed by Benjiman Core [phone]) on 10/04/2022 4:03:26 PM  Radiology CT Angio Chest PE W and/or Wo Contrast  Result Date: 10/04/2022 CLINICAL DATA:  Pulmonary embolism suspected. EXAM: CT ANGIOGRAPHY CHEST WITH CONTRAST TECHNIQUE: Multidetector CT imaging of the chest was performed using the standard protocol during bolus administration of intravenous contrast. Multiplanar CT image reconstructions and MIPs were obtained to evaluate the vascular anatomy. RADIATION DOSE REDUCTION: This exam was performed according to the departmental dose-optimization program which includes automated exposure control, adjustment of the mA and/or kV according to patient size and/or use of iterative reconstruction technique. CONTRAST:  75mL OMNIPAQUE IOHEXOL 350 MG/ML SOLN COMPARISON:  None Available. FINDINGS: Cardiovascular: Satisfactory opacification of the pulmonary arteries to the segmental level. No evidence of pulmonary embolism. Normal heart size. No pericardial effusion. Main pulmonary trunk is dilated measuring up to 3.4 cm concerning for pulmonary arterial hypertension.  Aberrant origin of the right subclavian artery. Mediastinum/Nodes: Bulky bilateral axillary, prevascular, right hilar, subcarinal lymph nodes, not significantly changed since prior examination, concerning for lymphoma. Lungs/Pleura: Bilateral lower lobe ground-glass and patchy lung opacities with bronchial thickening concerning for bilateral lower lobe pneumonia. No pleural effusion or pneumothorax. Upper Abdomen: No acute abnormality. Musculoskeletal: No chest wall abnormality. No acute or significant osseous findings. Review of the MIP images confirms the above findings. IMPRESSION: 1. No CT evidence of pulmonary embolism. 2. Bilateral lower lobe ground-glass and patchy lung opacities with bronchial thickening concerning for bilateral lower lobe pneumonia. 3. Bulky bilateral axillary, prevascular, right hilar, subcarinal lymph nodes, not significantly changed since prior examination, concerning for lymphoma. 4. Dilated main pulmonary trunk measuring up to 3.4 cm concerning for pulmonary arterial hypertension. 5. Aberrant origin of the right subclavian artery. Electronically Signed   By: Larose Hires D.O.   On: 10/04/2022 19:25   CT ABDOMEN PELVIS W CONTRAST  Result Date: 10/04/2022 CLINICAL DATA:  Bowel obstruction suspected. Emesis. History of CLL. EXAM: CT ABDOMEN AND PELVIS WITH CONTRAST TECHNIQUE: Multidetector CT imaging of the abdomen and pelvis was performed using the standard protocol following bolus administration of intravenous contrast. RADIATION DOSE REDUCTION: This exam was performed according to the departmental dose-optimization program which includes automated exposure control, adjustment of the mA and/or kV according to patient size and/or use of iterative reconstruction technique. CONTRAST:  75mL OMNIPAQUE IOHEXOL 350 MG/ML SOLN COMPARISON:  Abdominal ultrasound 09/24/2022. CT angiogram chest abdomen and pelvis 05/29/2022. FINDINGS: Lower chest: There are minimal patchy airspace and  ground-glass opacities in both lower lobes, likely infectious/inflammatory. Hepatobiliary: No focal liver abnormality is seen. No gallstones, gallbladder wall thickening, or biliary dilatation. Pancreas: Unremarkable. No pancreatic ductal dilatation or surrounding inflammatory changes. Spleen: Normal in size without focal abnormality. Adrenals/Urinary Tract: There is indistinctness of the corticomedullary enhancement pattern both kidneys with mild bilateral perinephric fat stranding, right greater than left. There is no hydronephrosis or perinephric fluid collection. Bilateral renal cysts are present measuring up 2 4.3 cm in the inferior pole the left kidney. Appearance is similar to the prior study. The adrenal glands and bladder are within normal limits. Stomach/Bowel: There is inferior rectal wall thickening inflammation, new from prior. There are  new small perirectal lymph nodes. There is no bowel obstruction, pneumatosis or free air. Appendix, stomach and small bowel are within normal limits. Vascular/Lymphatic: Aorta and IVC are normal in size. There are atherosclerotic calcifications of the aorta. There is diffuse retroperitoneal lymphadenopathy with the largest lymph node in the aortocaval region measuring 2.7 x 1.8 cm, similar to the prior study. Retrocaval lymphadenopathy has slightly increased for example image 3/40 measuring 16 mm short axis (previously 13). Right iliac chain lymphadenopathy has increased measuring 3.5 by 4.7 cm (previously 2.9 by 3.5 cm). Left iliac chain lymphadenopathy measures up to 2.1 cm (previously 18 mm). Bilateral pelvic sidewall lymphadenopathy has increased, right greater than left. The largest lymph node is along the right pelvic sidewall measuring 3.0 x 4.7 cm (previously 1.1 x 3.6 cm). There is new bilateral inguinal lymphadenopathy, right greater than left, measuring up to 16 mm short axis. Additionally, there is increasing periportal lymphadenopathy with largest lymph  node measuring up to 2 cm (previously 17 mm). Bulky central mesenteric lymphadenopathy is mildly diffusely increased. Right lower quadrant mesenteric lymphadenopathy has also mildly diffusely increased. Reproductive: Status post hysterectomy. No adnexal masses. Other: There is a small fat containing umbilical hernia. There is no ascites. Musculoskeletal: Bones are diffusely heterogeneous, unchanged. No acute fractures are seen. IMPRESSION: 1. There is new inferior rectal wall thickening and inflammation with new perirectal lymph nodes. Findings are worrisome for proctitis. Neoplasm not excluded. 2. Increased diffuse retroperitoneal, mesenteric, pelvic sidewall, inguinal, and periportal lymphadenopathy. 3. Bilateral perinephric fat stranding with indistinctness of the corticomedullary enhancement pattern of both kidneys worrisome for pyelonephritis. No hydronephrosis or perinephric fluid collection. 4. Minimal patchy airspace and ground-glass opacities in both lower lobes, likely infectious/inflammatory. 5. Stable heterogeneous appearance of the bones, likely related to patient's history of CLL. Aortic Atherosclerosis (ICD10-I70.0). Electronically Signed   By: Darliss Cheney M.D.   On: 10/04/2022 19:21   DG Chest Port 1 View  Result Date: 10/04/2022 CLINICAL DATA:  Shortness of breath EXAM: PORTABLE CHEST 1 VIEW COMPARISON:  Radiograph and CT 09/23/2022 FINDINGS: Stable cardiomediastinal silhouette. Aortic atherosclerotic calcification. Scarring left mid lung. No focal consolidation, pleural effusion, or pneumothorax. Prominent right hilum similar to prior radiograph and likely related to adenopathy as seen on CT 09/24/2022. No displaced rib fractures. IMPRESSION: 1. No active disease. 2. Prominent right hilum likely related to adenopathy as seen on CT 09/24/2022. Electronically Signed   By: Minerva Fester M.D.   On: 10/04/2022 17:31    Procedures Procedures    Medications Ordered in ED Medications   cefTRIAXone (ROCEPHIN) 2 g in sodium chloride 0.9 % 100 mL IVPB (has no administration in time range)  metroNIDAZOLE (FLAGYL) IVPB 500 mg (has no administration in time range)  ondansetron (ZOFRAN-ODT) disintegrating tablet 4 mg (4 mg Oral Given 10/04/22 1622)  lactated ringers bolus 1,000 mL ( Intravenous Stopped 10/04/22 1719)  lactated ringers bolus 1,000 mL ( Intravenous Infusion Verify 10/04/22 1908)  iohexol (OMNIPAQUE) 350 MG/ML injection 75 mL (75 mLs Intravenous Contrast Given 10/04/22 1902)    ED Course/ Medical Decision Making/ A&P                             Medical Decision Making Amount and/or Complexity of Data Reviewed Labs: ordered. Radiology: ordered.  Risk Prescription drug management. Decision regarding hospitalization.   Patient with tachycardia.  Nonspecific complaints.  Reportedly nausea and chest pain.  Recently seen GI.  Differential diagnosis includes dehydration,  pneumonia, sepsis.  Will get basic blood work.  Blood pressure has been decent but tachycardia.  Not febrile.  Will get chest x-ray and potentially CT scan.  Reviewed neurology and GI note from today, reviewed recent discharge note also.  White count is elevated.  Initial lactic acid elevated but then improved.  Heart rate is come down but still somewhat elevated.  Has not been hypotensive.  X-ray reassuring and showed adenopathy, however CT angiography done and showed likely bilateral pneumonia.  Also showed proctitis and potentially UTI.  Discussed with pharmacist and Rocephin and Flagyl should cover urine, pneumonia, and proctitis.  Will discuss with hospitalist for admission.  CRITICAL CARE Performed by: Benjiman Core Total critical care time: 30 minutes Critical care time was exclusive of separately billable procedures and treating other patients. Critical care was necessary to treat or prevent imminent or life-threatening deterioration. Critical care was time spent personally by me on the  following activities: development of treatment plan with patient and/or surrogate as well as nursing, discussions with consultants, evaluation of patient's response to treatment, examination of patient, obtaining history from patient or surrogate, ordering and performing treatments and interventions, ordering and review of laboratory studies, ordering and review of radiographic studies, pulse oximetry and re-evaluation of patient's condition.         Final Clinical Impression(s) / ED Diagnoses Final diagnoses:  Pneumonia due to infectious organism, unspecified laterality, unspecified part of lung  Proctitis    Rx / DC Orders ED Discharge Orders     None         Benjiman Core, MD 10/04/22 1958

## 2022-10-04 NOTE — Patient Instructions (Signed)
Stop Brilinta  Stop robaxin  Continue aspirin 81mg  daily  Start tizanidine 2mg  twice daily  Continue PT/OT  Return to clinic in 4 months

## 2022-10-04 NOTE — Progress Notes (Signed)
10/04/2022 [name] [id number] [birth year]   Chief Complaint: Rectal pain  History of Present Illness: 62 y.o. 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   She was last seen in our GI clinic by [name], PA-C 08/31/2022 for hematochezia/rectal ulcer follow-up.  She was admitted to the hospital 3/6 to 3/11 for painless hematochezia while on Brilinta. Colonoscopy during this hospitalization 07/08/2022 identified a 3 mm polyp to the ascending colon, a 3 mm polyp to the descending colon, polypoid changes to the sigmoid colon, tortuous colon, poor anal tone difficult to insufflate left colon/rectum, few ulcers in distal rectum and internal hemorrhoids. Pathology showed active proctitis/no evidence of chronicity, granuloma, dysplasia or malignancy. Negative CMV.  She was initially prescribed Canasa and Carafate suppositories which were not obtained then took Anusol and eventually received Canasa 1 g suppository once daily while in the SNF. There was some concern hat she most likely had stercoral ulcers with a component of rectal prolapse and less likely IBD.  She was readmitted to the hospital 09/23/2022 with N/V, dehydration and aspiration pneumonia. Treated with Unasyn -> Augmentin then discharged to the SNF on 09/27/2022.  She presents to our office today for follow up regarding rectal ulcers persistent rectal pain.  She developed active vomiting while in the waiting room prior to her scheduled appointment. She was promptly transported into an exam room. She was tachycardic. Vomited a moderate amount of clear then yellow bilious emesis. No hematemesis or coffee ground emesis. No CP.  Her sister [name] and CMA from the SNF are present. HR  128, repeat HR 140. BP 128/92. CMA from the SNF stated the patient's heart rate typically runs in the 90s.  Her sister, [name], reported Jamika strains to pass multiple soft to semi loose stools daily. No watery diarrhea. No rectal bleeding or black stools.  She is tolerating liquids and a pured diet at the SNF.       Latest Ref Rng & Units 09/27/2022    3:02 AM 09/26/2022    4:41 AM 09/24/2022    4:06 AM  CBC  WBC 4.0 - 10.5 K/uL 8.7  6.5  5.7   Hemoglobin 12.0 - 15.0 g/dL 78.2  95.6  21.3   Hematocrit 36.0 - 46.0 % 32.8  31.2  31.1   Platelets 150 - 400 K/uL 258  245  300        Latest Ref Rng & Units 09/27/2022    3:02 AM 09/26/2022    4:41 AM 09/24/2022    6:42 AM  CMP  Glucose 70 - 99 mg/dL 086  578  469   BUN 8 - 23 mg/dL 8  8  16    Creatinine 0.44 - 1.00 mg/dL 6.29  5.28  4.13   Sodium 135 - 145 mmol/L 138  139  136   Potassium 3.5 - 5.1 mmol/L 3.5  3.2  3.6   Chloride 98 - 111 mmol/L 108  107  101   CO2 22 - 32 mmol/L 21  23  23    Calcium 8.9 - 10.3 mg/dL 8.8  8.9  9.4   Total Protein 6.5 - 8.1 g/dL   7.0   Total Bilirubin 0.3 - 1.2 mg/dL   0.6   Alkaline Phos 38 - 126 U/L   109  AST 15 - 41 U/L   66   ALT 0 - 44 U/L   125     CTAP IMPRESSION 08/26/2022: 1. No CT evidence for acute intra-abdominal or pelvic abnormality. 2. Multiple enlarged retroperitoneal, mesenteric, pelvic and inguinal lymph nodes, increased compared to prior exam from January 2024 and consistent with history of leukemia/lymphoma. 3. Presacral edema, stranding and small volume fluid. Possible mild circumferential wall thickening at the anus as may be seen with proctitis. 4. Findings questionable for nondisplaced sacrococcygeal fracture, this could also contribute to presacral soft tissue stranding and edema. 5. 4.6 cm complex cyst lower pole left kidney with suspected thin septations, slightly increased in size compared to 2021. When the patient is clinically stable and able to follow  directions and hold their breath (preferably as an outpatient) further evaluation with dedicated abdominal MRI should be considered. 6. Aortic atherosclerosis.  Aortic Atherosclerosis   Cardiovascular: No filling defects in the pulmonary arteries to suggest pulmonary emboli. Heart borderline in size. Aorta normal caliber. Scattered aortic calcifications. Retroesophageal right subclavian artery.   Mediastinum/Nodes: Fall bulky mediastinal adenopathy again noted, worsening since prior study. Index left axillary lymph node has a short axis diameter of 2.9 cm compared to 2.5 cm previously. Index right hilar lymph node has a short axis diameter of 2.8 cm compared with 1.6 cm previously. Increasing mediastinal lymph node size. Subcarinal lymph node has a short axis diameter of 15 mm compared to 10 mm previously. Prevascular lymph node has a short axis diameter of 8 mm compared to 5 mm previously. Trachea and esophagus are unremarkable. Thyroid unremarkable.   Lungs/Pleura: Ground-glass airspace opacities in the perihilar regions, left slightly greater than right. Favor asymmetric edema although infection cannot be excluded. No effusions.   Upper Abdomen: Gallbladder appears distended with possible wall thickening and stones within the gallbladder. Low-density throughout the liver suggesting fatty infiltration.   Musculoskeletal: Chest wall soft tissues are unremarkable. No acute bony abnormality.   Review of the MIP images confirms the above findings.   IMPRESSION: Worsening bulky bilateral axillary adenopathy concerning for lymphoma. Borderline sized mediastinal lymph nodes.   Borderline heart size. Scattered ground-glass predominately perihilar airspace opacities, left greater than right could reflect early asymmetric edema or infection.   Gallbladder appears distended with possible wall thickening and internal stones. Consider further evaluation with right upper quadrant  ultrasound.  Aortic atherosclerosis.  Current Outpatient Medications on File Prior to Visit  Medication Sig Dispense Refill   acetaminophen (TYLENOL) 325 MG tablet Take 650 mg by mouth every 4 (four) hours as needed for mild pain. Do not exceed 3000mg  in 24 hours     AMBULATORY NON FORMULARY MEDICATION Medication Name: Carafate 50 mg suppositories: Insert 1 suppository rectally twice a day for 2 weeks 28 suppository 0   amLODipine (NORVASC) 10 MG tablet Take 1 tablet by mouth once daily 90 tablet 1   aspirin EC 81 MG tablet Take 1 tablet (81 mg total) by mouth daily. Swallow whole. 30 tablet 12   atorvastatin (LIPITOR) 80 MG tablet Take 1 tablet (80 mg total) by mouth daily. 30 tablet 3   B Complex Vitamins (VITAMIN B COMPLEX) TABS Take 1 tablet by mouth every morning.     Blood Pressure Monitoring (BLOOD PRESSURE MONITOR AUTOMAT) DEVI 1 Device by Does not apply route daily. 1 Device 0   cyanocobalamin 1000 MCG tablet Take 1,000 mcg by mouth daily.     Dulaglutide (TRULICITY) 0.75 MG/0.5ML SOPN Inject 0.75 mg into the  skin once a week. 6 mL 3   ezetimibe (ZETIA) 10 MG tablet Take 1 tablet (10 mg total) by mouth daily. 30 tablet 3   FLUoxetine (PROZAC) 20 MG capsule Take 1 capsule (20 mg total) by mouth daily. 30 capsule 0   hydrocortisone (ANUSOL-HC) 25 MG suppository Place 25 mg rectally 2 (two) times daily as needed for hemorrhoids or anal itching.     hydrOXYzine (ATARAX) 10 MG tablet Take 1 tablet (10 mg total) by mouth 3 (three) times daily as needed for anxiety. 30 tablet 0   insulin glargine-yfgn (SEMGLEE) 100 UNIT/ML injection Inject 0.05 mLs (5 Units total) into the skin daily.     Lidocaine 5 % CREA Apply 1 Application topically every 6 (six) hours as needed (rectal pain).     Menthol, Topical Analgesic, (BIOFREEZE) 4 % GEL Apply 1 Application topically in the morning. Neck and shoulders.     mesalamine (CANASA) 1000 MG suppository Place 1,000 mg rectally at bedtime.     methocarbamol  (ROBAXIN) 500 MG tablet Take 500 mg by mouth 2 (two) times daily.     omeprazole (PRILOSEC OTC) 20 MG tablet Take 20 mg by mouth daily.     ONE TOUCH LANCETS MISC USE TO CHECK BLOOD SUGAR TWICE A DAY AND PRN 100 each 6   OXYGEN Inhale 2 L/min into the lungs as needed.     polyethylene glycol (MIRALAX / GLYCOLAX) 17 g packet Take 17 g by mouth every other day. 14 each 0   saccharomyces boulardii (FLORASTOR) 250 MG capsule Take 1 capsule (250 mg total) by mouth 2 (two) times daily.     ticagrelor (BRILINTA) 90 MG TABS tablet Take 90 mg by mouth 2 (two) times daily.     AMBULATORY NON FORMULARY MEDICATION Medication Name: Diltiazem 2%/Lidocaine 2%   Using your index finger apply a small amount of medication inside the anal opening and to the external anal area twice daily x 6 weeks. (Patient not taking: Reported on 09/24/2022) 30 g 1   tiZANidine (ZANAFLEX) 2 MG tablet Take 1 tablet (2 mg total) by mouth 2 (two) times daily. 30 tablet 5   No current facility-administered medications on file prior to visit.   No Known Allergies   Current Medications, Allergies, Past Medical History, Past Surgical History, Family History and Social History were reviewed in [employer] record.  Review of Systems:   + intermittent SOB. No CP.  + N/V. No abdominal pain.  - confusion.   Physical Exam: BP (!) 128/92 (BP Location: Right Arm, Patient Position: Sitting, Cuff Size: Normal)   Pulse (!) 140   General: Chronically ill appearing female, actively vomiting sitting in wheel chair.  Head: Normocephalic and atraumatic. Eyes: No scleral icterus. Conjunctiva pink . Ears: Normal auditory acuity. Mouth: Dentition intact. No ulcers or lesions.  Lungs: Breath sounds tight, diminished throughout.  Heart: Tachycardic.  Abdomen: Soft, nontender and nondistended. No masses or hepatomegaly. Normal bowel sounds x 4 quadrants.  Rectal: Deferred.  Musculoskeletal: Symmetrical with no gross  deformities. Extremities: No edema. Neurological: Alert oriented x 4. No focal deficits.  Psychological: Alert and cooperative. Normal mood and affect  Assessment and Recommendations:  62 year old female with rectal pain secondary to rectal ulcer/proctitis. Colonoscopy during this hospitalization 07/08/2022 identified a 3 mm polyp to the ascending colon, a 3 mm polyp to the descending colon, polypoid changes to the sigmoid colon, tortuous colon, poor anal tone difficult to insufflate left colon/rectum, few ulcers in distal  rectum and internal hemorrhoids. Pathology showed active proctitis/no evidence of chronicity, granuloma, dysplasia or malignancy. Negative CMV.  Canasa 1 g suppository daily. -Increase Canasa suppository 1 PR twice daily -Defer flexible sigmoidoscopy at this time as patient is actively vomiting requiring ED evaluation -Check C. difficile PCR if patient passes loose stools -Further GI follow-up/management to be determined after ED evaluation completed, likely will require hospital admission  N/V, suspect component of dehydration with tachycardia.  No abdominal pain. -Patient transported to [facility] ED via EMS, require laboratory studies, chest x-ray and CTAP  Recent hospital admission with N/V, dehydration with aspiration pneumonia treated with antibiotics.  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

## 2022-10-04 NOTE — ED Triage Notes (Signed)
Pt arrives via EMS from her GI appointment. EMS states that patient endorses chest pain, nausea, and multiple episodes of vomiting. Pt is reportedly from a SNF(ems unsure of which SNF).

## 2022-10-04 NOTE — H&P (Signed)
History and Physical      Chi Crise NGE:[id number] DOB: [birth year] DOA: 10/04/2022; DOS: 10/04/2022  PCP: [place], [name], MD *** Patient coming from: home ***  I have personally briefly reviewed patient's old medical records in [facility]  Chief Complaint: ***  HPI: Senika Slagle is a 62 y.o. female with medical history significant for *** who is admitted to [facility] on 10/04/2022 with *** after presenting from home*** to [facility] ED complaining of ***.    ***       ***   ED Course:  Vital signs in the ED were notable for the following: ***  Labs were notable for the following: ***  Per my interpretation, EKG in ED demonstrated the following:  ***  Imaging and additional notable ED work-up: ***  While in the ED, the following were administered: ***  Subsequently, the patient was admitted  ***  ***red    Review of Systems: As per HPI otherwise 10 point review of systems negative.   Past Medical History:  Diagnosis Date   Abnormality of gait 05/10/2010   BACK PAIN 11/14/2008   Class 1 obesity due to excess calories with body mass index (BMI) of 31.0 to 31.9 in adult 02/07/2021   DIABETES MELLITUS, TYPE II 07/15/2008   Diplopia 07/15/2008   ECZEMA, ATOPIC 04/03/2009   GERD (gastroesophageal reflux disease)    Guillain-Barre (HCC) 1988   HYPERLIPIDEMIA 03/06/2009   HYPERTENSION 07/15/2008   Stroke (HCC) 2010, 2011   x2    Vertebral artery stenosis     Past Surgical History:  Procedure Laterality Date   ABDOMINAL HYSTERECTOMY     BIOPSY  07/08/2022   Procedure: BIOPSY;  Surgeon: Benancio Deeds, MD;  Location: [facility];  Service: Gastroenterology;;   COLONOSCOPY WITH PROPOFOL N/A 07/08/2022   Procedure: COLONOSCOPY WITH PROPOFOL;  Surgeon: Benancio Deeds, MD;  Location: [facility];  Service: Gastroenterology;  Laterality: N/A;   DILATION AND CURETTAGE OF UTERUS     FOOT SURGERY     IR ANGIO INTRA EXTRACRAN SEL COM CAROTID  INNOMINATE UNI L MOD SED  09/17/2021   IR ANGIO INTRA EXTRACRAN SEL INTERNAL CAROTID UNI R MOD SED  09/17/2021   IR ANGIO VERTEBRAL SEL VERTEBRAL UNI R MOD SED  09/17/2021   IR US GUIDE VASC ACCESS RIGHT  09/17/2021    Social History:  reports that she has never smoked. She has never used smokeless tobacco. She reports that she does not drink alcohol and does not use drugs.   No Known Allergies  Family History  Problem Relation Age of Onset   Diabetes Sister    Asthma Other    Stroke Other    Hypertension Other    Diabetes Mother    Stroke Mother    Cancer Father        pt states hae had some kind of stomach cancer, ? stomach or colon   Breast cancer Neg Hx     Family history reviewed and not pertinent ***   Prior to Admission medications   Medication Sig Start Date End Date Taking? Authorizing Provider  acetaminophen (TYLENOL) 325 MG tablet Take 650 mg by mouth every 4 (four) hours as needed for mild pain. Do not exceed 3000mg  in 24 hours    [provider]  AMBULATORY NON FORMULARY MEDICATION Medication Name: Carafate 50 mg suppositories: Insert 1 suppository rectally twice a day for 2 weeks 08/31/22   Doree Albee, PA-C  AMBULATORY NON  FORMULARY MEDICATION Medication Name: Diltiazem 2%/Lidocaine 2%   Using your index finger apply a small amount of medication inside the anal opening and to the external anal area twice daily x 6 weeks. Patient not taking: Reported on 09/24/2022 08/31/22   Doree Albee, PA-C  amLODipine (NORVASC) 10 MG tablet Take 1 tablet by mouth once daily 05/09/22   [place], [name], MD  aspirin EC 81 MG tablet Take 1 tablet (81 mg total) by mouth daily. Swallow whole. 06/18/22   Osvaldo Shipper, MD  atorvastatin (LIPITOR) 80 MG tablet Take 1 tablet (80 mg total) by mouth daily. 05/09/22   [place], [name], MD  B Complex Vitamins (VITAMIN B COMPLEX) TABS Take 1 tablet by mouth every morning.    [provider]  Blood Pressure Monitoring (BLOOD  PRESSURE MONITOR AUTOMAT) DEVI 1 Device by Does not apply route daily. 08/29/18   [place], [name], MD  cyanocobalamin 1000 MCG tablet Take 1,000 mcg by mouth daily.    [provider]  Dulaglutide (TRULICITY) 0.75 MG/0.5ML SOPN Inject 0.75 mg into the skin once a week. 04/13/22   Shamleffer, Konrad Dolores, MD  ezetimibe (ZETIA) 10 MG tablet Take 1 tablet (10 mg total) by mouth daily. 05/09/22   [place], [name], MD  FLUoxetine (PROZAC) 20 MG capsule Take 1 capsule (20 mg total) by mouth daily. 09/27/22   Rhetta Mura, MD  hydrocortisone (ANUSOL-HC) 25 MG suppository Place 25 mg rectally 2 (two) times daily as needed for hemorrhoids or anal itching.    [provider]  hydrOXYzine (ATARAX) 10 MG tablet Take 1 tablet (10 mg total) by mouth 3 (three) times daily as needed for anxiety. 09/27/22   Rhetta Mura, MD  insulin glargine-yfgn (SEMGLEE) 100 UNIT/ML injection Inject 0.05 mLs (5 Units total) into the skin daily. 07/12/22   Hongalgi, Maximino Greenland, MD  Lidocaine 5 % CREA Apply 1 Application topically every 6 (six) hours as needed (rectal pain).    [provider]  Menthol, Topical Analgesic, (BIOFREEZE) 4 % GEL Apply 1 Application topically in the morning. Neck and shoulders.    [provider]  mesalamine (CANASA) 1000 MG suppository Place 1,000 mg rectally at bedtime.    [provider]  methocarbamol (ROBAXIN) 500 MG tablet Take 500 mg by mouth 2 (two) times daily.    [provider]  omeprazole (PRILOSEC OTC) 20 MG tablet Take 20 mg by mouth daily.    [provider]  ONE TOUCH LANCETS MISC USE TO CHECK BLOOD SUGAR TWICE A DAY AND PRN 07/29/15   Gordy Savers, MD  OXYGEN Inhale 2 L/min into the lungs as needed.    [provider]  polyethylene glycol (MIRALAX / GLYCOLAX) 17 g packet Take 17 g by mouth every other day. 09/27/22   Rhetta Mura, MD  saccharomyces boulardii (FLORASTOR) 250 MG capsule Take 1  capsule (250 mg total) by mouth 2 (two) times daily. 06/17/22   Osvaldo Shipper, MD  ticagrelor (BRILINTA) 90 MG TABS tablet Take 90 mg by mouth 2 (two) times daily.    [provider]  tiZANidine (ZANAFLEX) 2 MG tablet Take 1 tablet (2 mg total) by mouth 2 (two) times daily. 10/04/22   Glendale Chard, DO     Objective    Physical Exam: Vitals:   10/04/22 1800 10/04/22 1830 10/04/22 2000 10/04/22 2012  BP: (!) 151/87 (!) 158/100 (!) 150/98   Pulse: (!) 118 (!) 118 (!) 114   Resp: [name])  26 (!) 25 (!) 25   Temp:    98.2 F (36.8 C)  TempSrc:    Oral  SpO2: 100% 100% 100%     General: appears to be stated age; alert, oriented Skin: warm, dry, no rash Head:  AT/[place] Mouth:  Oral mucosa membranes appear moist, normal dentition Neck: supple; trachea midline Heart:  RRR; did not appreciate any M/R/G Lungs: CTAB, did not appreciate any wheezes, rales, or rhonchi Abdomen: + BS; soft, ND, NT Vascular: 2+ pedal pulses b/l; 2+ radial pulses b/l Extremities: no peripheral edema, no muscle wasting Neuro: strength and sensation intact in upper and lower extremities b/l ***   *** Neuro: 5/5 strength of the proximal and distal flexors and extensors of the upper and lower extremities bilaterally; sensation intact in upper and lower extremities b/l; cranial nerves II through XII grossly intact; no pronator drift; no evidence suggestive of slurred speech, dysarthria, or facial droop; Normal muscle tone. No tremors.  *** Neuro: In the setting of the patient's current mental status and associated inability to follow instructions, unable to perform full neurologic exam at this time.  As such, assessment of strength, sensation, and cranial nerves is limited at this time. Patient noted to spontaneously move all 4 extremities. No tremors.  ***    Labs on Admission: I have personally reviewed following labs and imaging studies  CBC: Recent Labs  Lab 10/04/22 1621  WBC 14.7*  HGB 12.6  HCT  39.2  MCV 79.0*  PLT 393   Basic Metabolic Panel: Recent Labs  Lab 10/04/22 1621  NA 136  K 4.2  CL 102  CO2 21*  GLUCOSE 231*  BUN 20  CREATININE 0.98  CALCIUM 10.1   GFR: Estimated Creatinine Clearance: 50.7 mL/min (by C-G formula based on SCr of 0.98 mg/dL). Liver Function Tests: Recent Labs  Lab 10/04/22 1621  AST 94*  ALT 180*  ALKPHOS 152*  BILITOT 0.5  PROT 8.1  ALBUMIN 3.2*   No results for input(s): "LIPASE", "AMYLASE" in the last 168 hours. No results for input(s): "AMMONIA" in the last 168 hours. Coagulation Profile: No results for input(s): "INR", "PROTIME" in the last 168 hours. Cardiac Enzymes: No results for input(s): "CKTOTAL", "CKMB", "CKMBINDEX", "TROPONINI" in the last 168 hours. BNP (last 3 results) No results for input(s): "PROBNP" in the last 8760 hours. HbA1C: No results for input(s): "HGBA1C" in the last 72 hours. CBG: No results for input(s): "GLUCAP" in the last 168 hours. Lipid Profile: No results for input(s): "CHOL", "HDL", "LDLCALC", "TRIG", "CHOLHDL", "LDLDIRECT" in the last 72 hours. Thyroid Function Tests: No results for input(s): "TSH", "T4TOTAL", "FREET4", "T3FREE", "THYROIDAB" in the last 72 hours. Anemia Panel: No results for input(s): "VITAMINB12", "FOLATE", "FERRITIN", "TIBC", "IRON", "RETICCTPCT" in the last 72 hours. Urine analysis:    Component Value Date/Time   COLORURINE YELLOW 08/21/2022 1047   APPEARANCEUR HAZY (A) 08/21/2022 1047   LABSPEC 1.011 08/21/2022 1047   PHURINE 7.0 08/21/2022 1047   GLUCOSEU 50 (A) 08/21/2022 1047   HGBUR NEGATIVE 08/21/2022 1047   HGBUR negative 03/12/2010 0847   BILIRUBINUR NEGATIVE 08/21/2022 1047   BILIRUBINUR n 09/11/2015 1039   KETONESUR NEGATIVE 08/21/2022 1047   PROTEINUR >=300 (A) 08/21/2022 1047   UROBILINOGEN 1.0 09/11/2015 1039   UROBILINOGEN 1.0 05/19/2014 1858   NITRITE NEGATIVE 08/21/2022 1047   LEUKOCYTESUR NEGATIVE 08/21/2022 1047    Radiological Exams on  Admission: CT Angio Chest PE W and/or Wo Contrast  Result Date: 10/04/2022 CLINICAL DATA:  Pulmonary  embolism suspected. EXAM: CT ANGIOGRAPHY CHEST WITH CONTRAST TECHNIQUE: Multidetector CT imaging of the chest was performed using the standard protocol during bolus administration of intravenous contrast. Multiplanar CT image reconstructions and MIPs were obtained to evaluate the vascular anatomy. RADIATION DOSE REDUCTION: This exam was performed according to the departmental dose-optimization program which includes automated exposure control, adjustment of the mA and/or kV according to patient size and/or use of iterative reconstruction technique. CONTRAST:  75mL OMNIPAQUE IOHEXOL 350 MG/ML SOLN COMPARISON:  None Available. FINDINGS: Cardiovascular: Satisfactory opacification of the pulmonary arteries to the segmental level. No evidence of pulmonary embolism. Normal heart size. No pericardial effusion. Main pulmonary trunk is dilated measuring up to 3.4 cm concerning for pulmonary arterial hypertension. Aberrant origin of the right subclavian artery. Mediastinum/Nodes: Bulky bilateral axillary, prevascular, right hilar, subcarinal lymph nodes, not significantly changed since prior examination, concerning for lymphoma. Lungs/Pleura: Bilateral lower lobe ground-glass and patchy lung opacities with bronchial thickening concerning for bilateral lower lobe pneumonia. No pleural effusion or pneumothorax. Upper Abdomen: No acute abnormality. Musculoskeletal: No chest wall abnormality. No acute or significant osseous findings. Review of the MIP images confirms the above findings. IMPRESSION: 1. No CT evidence of pulmonary embolism. 2. Bilateral lower lobe ground-glass and patchy lung opacities with bronchial thickening concerning for bilateral lower lobe pneumonia. 3. Bulky bilateral axillary, prevascular, right hilar, subcarinal lymph nodes, not significantly changed since prior examination, concerning for lymphoma. 4.  Dilated main pulmonary trunk measuring up to 3.4 cm concerning for pulmonary arterial hypertension. 5. Aberrant origin of the right subclavian artery. Electronically Signed   By: Larose Hires D.O.   On: 10/04/2022 19:25   CT ABDOMEN PELVIS W CONTRAST  Result Date: 10/04/2022 CLINICAL DATA:  Bowel obstruction suspected. Emesis. History of CLL. EXAM: CT ABDOMEN AND PELVIS WITH CONTRAST TECHNIQUE: Multidetector CT imaging of the abdomen and pelvis was performed using the standard protocol following bolus administration of intravenous contrast. RADIATION DOSE REDUCTION: This exam was performed according to the departmental dose-optimization program which includes automated exposure control, adjustment of the mA and/or kV according to patient size and/or use of iterative reconstruction technique. CONTRAST:  75mL OMNIPAQUE IOHEXOL 350 MG/ML SOLN COMPARISON:  Abdominal ultrasound 09/24/2022. CT angiogram chest abdomen and pelvis 05/29/2022. FINDINGS: Lower chest: There are minimal patchy airspace and ground-glass opacities in both lower lobes, likely infectious/inflammatory. Hepatobiliary: No focal liver abnormality is seen. No gallstones, gallbladder wall thickening, or biliary dilatation. Pancreas: Unremarkable. No pancreatic ductal dilatation or surrounding inflammatory changes. Spleen: Normal in size without focal abnormality. Adrenals/Urinary Tract: There is indistinctness of the corticomedullary enhancement pattern both kidneys with mild bilateral perinephric fat stranding, right greater than left. There is no hydronephrosis or perinephric fluid collection. Bilateral renal cysts are present measuring up 2 4.3 cm in the inferior pole the left kidney. Appearance is similar to the prior study. The adrenal glands and bladder are within normal limits. Stomach/Bowel: There is inferior rectal wall thickening inflammation, new from prior. There are new small perirectal lymph nodes. There is no bowel obstruction,  pneumatosis or free air. Appendix, stomach and small bowel are within normal limits. Vascular/Lymphatic: Aorta and IVC are normal in size. There are atherosclerotic calcifications of the aorta. There is diffuse retroperitoneal lymphadenopathy with the largest lymph node in the aortocaval region measuring 2.7 x 1.8 cm, similar to the prior study. Retrocaval lymphadenopathy has slightly increased for example image 3/40 measuring 16 mm short axis (previously 13). Right iliac chain lymphadenopathy has increased measuring 3.5 by 4.7 cm (previously  2.9 by 3.5 cm). Left iliac chain lymphadenopathy measures up to 2.1 cm (previously 18 mm). Bilateral pelvic sidewall lymphadenopathy has increased, right greater than left. The largest lymph node is along the right pelvic sidewall measuring 3.0 x 4.7 cm (previously 1.1 x 3.6 cm). There is new bilateral inguinal lymphadenopathy, right greater than left, measuring up to 16 mm short axis. Additionally, there is increasing periportal lymphadenopathy with largest lymph node measuring up to 2 cm (previously 17 mm). Bulky central mesenteric lymphadenopathy is mildly diffusely increased. Right lower quadrant mesenteric lymphadenopathy has also mildly diffusely increased. Reproductive: Status post hysterectomy. No adnexal masses. Other: There is a small fat containing umbilical hernia. There is no ascites. Musculoskeletal: Bones are diffusely heterogeneous, unchanged. No acute fractures are seen. IMPRESSION: 1. There is new inferior rectal wall thickening and inflammation with new perirectal lymph nodes. Findings are worrisome for proctitis. Neoplasm not excluded. 2. Increased diffuse retroperitoneal, mesenteric, pelvic sidewall, inguinal, and periportal lymphadenopathy. 3. Bilateral perinephric fat stranding with indistinctness of the corticomedullary enhancement pattern of both kidneys worrisome for pyelonephritis. No hydronephrosis or perinephric fluid collection. 4. Minimal patchy  airspace and ground-glass opacities in both lower lobes, likely infectious/inflammatory. 5. Stable heterogeneous appearance of the bones, likely related to patient's history of CLL. Aortic Atherosclerosis (ICD10-I70.0). Electronically Signed   By: Darliss Cheney M.D.   On: 10/04/2022 19:21   DG Chest Port 1 View  Result Date: 10/04/2022 CLINICAL DATA:  Shortness of breath EXAM: PORTABLE CHEST 1 VIEW COMPARISON:  Radiograph and CT 09/23/2022 FINDINGS: Stable cardiomediastinal silhouette. Aortic atherosclerotic calcification. Scarring left mid lung. No focal consolidation, pleural effusion, or pneumothorax. Prominent right hilum similar to prior radiograph and likely related to adenopathy as seen on CT 09/24/2022. No displaced rib fractures. IMPRESSION: 1. No active disease. 2. Prominent right hilum likely related to adenopathy as seen on CT 09/24/2022. Electronically Signed   By: Minerva Fester M.D.   On: 10/04/2022 17:31      Assessment/Plan   Principal Problem:   CAP (community acquired pneumonia)   ***       ***            ***             ***            ***            ***            ***             ***           ***           ***           ***           ***          ***          ***  DVT prophylaxis: SCD's ***  Code Status: Full code*** Family Communication: none*** Disposition Plan: Per Rounding Team Consults called: none***;  Admission status: ***     I SPENT GREATER THAN 75 *** MINUTES IN CLINICAL CARE TIME/MEDICAL DECISION-MAKING IN COMPLETING THIS ADMISSION.      Chaney Born Jerami Tammen DO [facility]  From 7PM - 7AM   10/04/2022, 8:55 PM   ***

## 2022-10-04 NOTE — Patient Instructions (Addendum)
Further recommedation to be determined after evalution.  Thank you for trusting me with your gastrointestinal care!   [name], CRNP

## 2022-10-04 NOTE — ED Notes (Signed)
Placed pt on 3L [place] for increased RR and sats at 90%

## 2022-10-04 NOTE — Progress Notes (Signed)
Attending Physician's Attestation   I have reviewed the chart.  I evaluated the patient as well in clinic.    Patient with her family as well as caregiver from her rehabilitation facility.  Progressive issues in regards to nausea and vomiting that have been worsening over the last few weeks since her discharge and she has a history of previous rectal ulceration on endoscopic evaluation.  Patient is having heart rates in the 140s at this point with progressive nausea and vomiting.  Although hemodynamically from a respiratory standpoint she is doing well, her cardiovascular status is such that she needs further evaluation and fluids and antiemetics via IV or PR.  Please see completed note by NP [facility] for full details of today's outpatient visit.  I agree with the Advanced Practitioner's note, impression, and recommendations with any updates as below.    Corliss Parish, MD [facility] Advanced Endoscopy Office # [phone]

## 2022-10-04 NOTE — Progress Notes (Signed)
Follow-up Visit   Date: 10/04/22   [name] MRN: [id number] DOB: 1960-10-17   Interim History: [name] is a 62 y.o. 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  She has history of prior stroke in 2010, pontine stroke (01/2021) and bilateral cerebellar stroke (08/2021).  The patient was accompanied to the clinic by caregiver.   IMPRESSION/PLAN: Cerebrovascular disease with severe intracranial stenosis involving multiple vessels.  She has history of multiple strokes (2010, pontine stroke in 2022, cerebellar stroke 2023, and more recently bilateral medullary stroke and left cerebellar stroke).  She has been on a number of antiplatelet regiments (aspirin + plavix, plavix + pletal, and more recently aspirin + Brilinta).  She has completed 3 months of dual antiplatelet therapy with aspirin + Brilanta, so will continue monotherapy with aspirin 81mg  daily.  I stressed the importance of secondary risk factor modification and her diabetes, cholesterol, and blood pressure are significantly better on medication with HbA1c 14 > 6.7 and LDL 240 > 81.  Appreciate PCP's care in her risk factor control. Continue PT/OT/Speech therapy For neck discomfort, start tizanidine 2mg  BID and stop robaxin.  Return to clinic in 4 months  -------------------------------------------------- UPDATE 10/04/2022:  She was hospitalized in January for right medullary stroke presenting with gait change, diplopia, and dizziness.  She was discharged on plavix + pletal.  In February, she was readmitted with slurred speech, shortness of breath, and worsening right side weakness with imaging showing extension of the right medullary stroke to the left side.    She was switched to  aspirin + Brilinta and recommended to continue this for 3 months.   Since her hospitalization, her diabetes is much better managed and HbA1c 11.4 > 6.7 and LDL is also improved from 409 > 81.  She has been as SNF and getting PT/OT.  She has noticed that her hand strength is starting to improve. She is antigravity in the right arm and had some movement in the left hand.  She was able to stand with assistance, which is improved.   She complains of odd sensation and tight sensation over the neck since her last stroke.    Medications:  Current Outpatient Medications on File Prior to Visit  Medication Sig Dispense Refill   acetaminophen (TYLENOL) 325 MG tablet Take 650 mg by mouth every 4 (four) hours as needed for mild pain. Do not exceed 3000mg  in 24 hours     AMBULATORY NON FORMULARY MEDICATION Medication Name: Carafate 50 mg suppositories: Insert 1 suppository rectally twice a day for 2 weeks 28 suppository 0   amLODipine (NORVASC) 10 MG tablet Take 1 tablet by mouth once daily 90 tablet 1   aspirin EC 81 MG tablet Take 1 tablet (81 mg total) by mouth daily. Swallow whole. 30 tablet 12   atorvastatin (LIPITOR) 80 MG tablet Take 1 tablet (80 mg total) by mouth daily. 30 tablet 3   B Complex Vitamins (VITAMIN B COMPLEX) TABS Take 1 tablet by mouth every morning.     Blood Pressure Monitoring (BLOOD PRESSURE MONITOR AUTOMAT) DEVI 1 Device by Does not apply route daily. 1 Device 0   cyanocobalamin 1000 MCG tablet Take 1,000 mcg by mouth daily.     Dulaglutide (TRULICITY) 0.75 MG/0.5ML SOPN Inject 0.75 mg into the skin once a week. 6 mL 3  ezetimibe (ZETIA) 10 MG tablet Take 1 tablet (10 mg total) by mouth daily. 30 tablet 3   FLUoxetine (PROZAC) 20 MG capsule Take 1 capsule (20 mg total) by mouth daily. 30 capsule 0   hydrocortisone (ANUSOL-HC) 25 MG suppository Place 25 mg rectally 2 (two) times daily as needed for hemorrhoids or anal itching.     hydrOXYzine (ATARAX) 10 MG tablet Take 1 tablet (10  mg total) by mouth 3 (three) times daily as needed for anxiety. 30 tablet 0   insulin glargine-yfgn (SEMGLEE) 100 UNIT/ML injection Inject 0.05 mLs (5 Units total) into the skin daily.     Lidocaine 5 % CREA Apply 1 Application topically every 6 (six) hours as needed (rectal pain).     Menthol, Topical Analgesic, (BIOFREEZE) 4 % GEL Apply 1 Application topically in the morning. Neck and shoulders.     mesalamine (CANASA) 1000 MG suppository Place 1,000 mg rectally at bedtime.     methocarbamol (ROBAXIN) 500 MG tablet Take 1 tablet (500 mg total) by mouth 2 (two) times daily. 20 tablet 0   omeprazole (PRILOSEC OTC) 20 MG tablet Take 20 mg by mouth daily.     ONE TOUCH LANCETS MISC USE TO CHECK BLOOD SUGAR TWICE A DAY AND PRN 100 each 6   polyethylene glycol (MIRALAX / GLYCOLAX) 17 g packet Take 17 g by mouth every other day. 14 each 0   saccharomyces boulardii (FLORASTOR) 250 MG capsule Take 1 capsule (250 mg total) by mouth 2 (two) times daily.     ticagrelor (BRILINTA) 90 MG TABS tablet Take 1 tablet (90 mg total) by mouth 2 (two) times daily. 60 tablet    AMBULATORY NON FORMULARY MEDICATION Medication Name: Diltiazem 2%/Lidocaine 2%   Using your index finger apply a small amount of medication inside the anal opening and to the external anal area twice daily x 6 weeks. (Patient not taking: Reported on 09/24/2022) 30 g 1   No current facility-administered medications on file prior to visit.    Allergies: No Known Allergies  Vital Signs:  BP (!) 155/86   Pulse (!) 110   Ht 5\' 4"  (1.626 m)   Wt 142 lb (64.4 kg)   SpO2 93%   BMI 24.37 kg/m   Neurological Exam: MENTAL STATUS including orientation to time, place, person, recent and remote memory, attention span and concentration, language, and fund of knowledge is normal.  Speech is midly dysarthric.  CRANIAL NERVES:   Pupils equal round and reactive to light.  Normal conjugate, extra-ocular eye movements in all directions of gaze.  No  ptosis .  Face is symmetric. Palate elevates symmetrically.  Tongue is midline.  MOTOR:  Moderate atrophy of the hands, worse on the left.  Generalized loss of muscle bulk in the legs.   RUE  5/5 proximally, 4/5 in the hand LUE  1/5 proximally, 2/5 in the hand RLE  4/5 proximally and disally LLE   1/5 throughout  MSRs:  Reflexes are 2+/4 throughout.  SENSORY:  Vibration and temperature intact throughout.  COORDINATION/GAIT:  Gait is not tested, patient in wheelchair  Data: NCS/EMG of the upper extremities 06/14/2018: The electrophysiologic findings are consistent with a sensorimotor polyneuropathy, with axonal and demyelinating features.  Overall, these findings are moderate in degree electrically.  MRI brain wo contrast 06/12/2022: 1. Increased size of a recent right medullary infarct which now extends to the left of midline. 2. New punctate acute left cerebellar infarct. 3. Mild chronic small vessel ischemic disease.  Chronic cerebellar infarcts.  MRI/A head and neck 05/30/2022: 1. Small acute infarct in the right aspect of the medulla without hemorrhage or mass effect. 2. Background chronic small-vessel ischemic change with small remote infarcts in the bilateral cerebellar hemispheres, one of which on the left is new since 09/15/2021. 3. Prominent lymph nodes in the upper neck consistent with the history of lymphoma, overall decreased in size since the prior studies from 09/15/2021 but incompletely evaluated.   MRA HEAD   1. Diminutive enhancement of the right V4 segment with intermittent occlusion, and multifocal severe stenosis/near occlusion of the left V4 segment, similar to the prior CTA. 2. Additional intracranial atherosclerotic disease as detailed above resulting in moderate stenosis of the left cavernous ICA, mild-to-moderate stenosis of the right supraclinoid ICA, severe stenosis of the origin of the left superior M2 division, severe stenosis of the distal right  M1 segment, and severe stenosis of the proximal left P1 segment.   MRA NECK   1. 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 2. Moderate stenosis of the origin of the left vertebral artery, not significantly changed. 3. Patent carotid systems bilaterally.    Lab Results  Component Value Date   CHOL 126 06/13/2022   HDL 28 (L) 06/13/2022   LDLCALC 81 06/13/2022   LDLDIRECT 175.9 03/02/2012   TRIG 84 06/13/2022   CHOLHDL 4.5 06/13/2022   Lab Results  Component Value Date   HGBA1C 6.7 (A) 09/06/2022   Total time spent reviewing records, interview, history/exam, documentation, and coordination of care on day of encounter:  45 min    Thank you for allowing me to participate in patient's care.  If I can answer any additional questions, I would be pleased to do so.    Sincerely,    Tahani Potier K. Allena Katz, DO

## 2022-10-05 ENCOUNTER — Encounter (HOSPITAL_COMMUNITY): Payer: Self-pay | Admitting: Internal Medicine

## 2022-10-05 DIAGNOSIS — R6520 Severe sepsis without septic shock: Secondary | ICD-10-CM | POA: Diagnosis present

## 2022-10-05 DIAGNOSIS — N19 Unspecified kidney failure: Secondary | ICD-10-CM | POA: Diagnosis present

## 2022-10-05 DIAGNOSIS — J189 Pneumonia, unspecified organism: Secondary | ICD-10-CM | POA: Diagnosis not present

## 2022-10-05 DIAGNOSIS — E860 Dehydration: Secondary | ICD-10-CM | POA: Diagnosis present

## 2022-10-05 DIAGNOSIS — F32A Depression, unspecified: Secondary | ICD-10-CM | POA: Diagnosis present

## 2022-10-05 DIAGNOSIS — K6289 Other specified diseases of anus and rectum: Secondary | ICD-10-CM | POA: Diagnosis present

## 2022-10-05 DIAGNOSIS — R8281 Pyuria: Secondary | ICD-10-CM | POA: Diagnosis present

## 2022-10-05 DIAGNOSIS — R7401 Elevation of levels of liver transaminase levels: Secondary | ICD-10-CM | POA: Diagnosis present

## 2022-10-05 LAB — CBC WITH DIFFERENTIAL/PLATELET
Abs Immature Granulocytes: 0.04 10*3/uL (ref 0.00–0.07)
Basophils Absolute: 0 10*3/uL (ref 0.0–0.1)
Basophils Relative: 0 %
Eosinophils Absolute: 0 10*3/uL (ref 0.0–0.5)
Eosinophils Relative: 0 %
HCT: 30.8 % — ABNORMAL LOW (ref 36.0–46.0)
Hemoglobin: 10 g/dL — ABNORMAL LOW (ref 12.0–15.0)
Immature Granulocytes: 0 %
Lymphocytes Relative: 26 %
Lymphs Abs: 2.9 10*3/uL (ref 0.7–4.0)
MCH: 25.5 pg — ABNORMAL LOW (ref 26.0–34.0)
MCHC: 32.5 g/dL (ref 30.0–36.0)
MCV: 78.6 fL — ABNORMAL LOW (ref 80.0–100.0)
Monocytes Absolute: 1.3 10*3/uL — ABNORMAL HIGH (ref 0.1–1.0)
Monocytes Relative: 12 %
Neutro Abs: 6.8 10*3/uL (ref 1.7–7.7)
Neutrophils Relative %: 62 %
Platelets: 301 10*3/uL (ref 150–400)
RBC: 3.92 MIL/uL (ref 3.87–5.11)
RDW: 16 % — ABNORMAL HIGH (ref 11.5–15.5)
WBC: 11 10*3/uL — ABNORMAL HIGH (ref 4.0–10.5)
nRBC: 0 % (ref 0.0–0.2)

## 2022-10-05 LAB — COMPREHENSIVE METABOLIC PANEL
ALT: 129 U/L — ABNORMAL HIGH (ref 0–44)
AST: 62 U/L — ABNORMAL HIGH (ref 15–41)
Albumin: 2.5 g/dL — ABNORMAL LOW (ref 3.5–5.0)
Alkaline Phosphatase: 111 U/L (ref 38–126)
Anion gap: 7 (ref 5–15)
BUN: 15 mg/dL (ref 8–23)
CO2: 24 mmol/L (ref 22–32)
Calcium: 9.3 mg/dL (ref 8.9–10.3)
Chloride: 107 mmol/L (ref 98–111)
Creatinine, Ser: 0.63 mg/dL (ref 0.44–1.00)
GFR, Estimated: >60 mL/min (ref >60)
Glucose, Bld: 168 mg/dL — ABNORMAL HIGH (ref 70–99)
Potassium: 4.3 mmol/L (ref 3.5–5.1)
Sodium: 138 mmol/L (ref 135–145)
Total Bilirubin: 0.4 mg/dL (ref 0.3–1.2)
Total Protein: 6.2 g/dL — ABNORMAL LOW (ref 6.5–8.1)

## 2022-10-05 LAB — GLUCOSE, CAPILLARY
Glucose-Capillary: 138 mg/dL — ABNORMAL HIGH (ref 70–99)
Glucose-Capillary: 137 mg/dL — ABNORMAL HIGH (ref 70–99)
Glucose-Capillary: 119 mg/dL — ABNORMAL HIGH (ref 70–99)
Glucose-Capillary: 128 mg/dL — ABNORMAL HIGH (ref 70–99)

## 2022-10-05 LAB — MAGNESIUM: Magnesium: 2 mg/dL (ref 1.7–2.4)

## 2022-10-05 LAB — MRSA NEXT GEN BY PCR, NASAL: MRSA by PCR Next Gen: NOT DETECTED

## 2022-10-05 LAB — STREP PNEUMONIAE URINARY ANTIGEN: Strep Pneumo Urinary Antigen: NEGATIVE

## 2022-10-05 LAB — PROTIME-INR
INR: 1.1 (ref 0.8–1.2)
Prothrombin Time: 13.9 seconds (ref 11.4–15.2)

## 2022-10-05 LAB — PROCALCITONIN: Procalcitonin: 0.75 ng/mL

## 2022-10-05 LAB — CULTURE, BLOOD (ROUTINE X 2): Culture: NO GROWTH

## 2022-10-05 LAB — PHOSPHORUS: Phosphorus: 3.5 mg/dL (ref 2.5–4.6)

## 2022-10-05 MED ORDER — INSULIN ASPART 100 UNIT/ML IJ SOLN
0.0000 [IU] | Freq: Three times a day (TID) | INTRAMUSCULAR | Status: DC
  Administered 2022-10-05 – 2022-10-08 (×6): 1 [IU] via SUBCUTANEOUS
  Administered 2022-10-08: 5 [IU] via SUBCUTANEOUS
  Administered 2022-10-09 – 2022-10-11 (×4): 1 [IU] via SUBCUTANEOUS
  Administered 2022-10-11: 2 [IU] via SUBCUTANEOUS
  Administered 2022-10-11 – 2022-10-12 (×3): 1 [IU] via SUBCUTANEOUS
  Administered 2022-10-12: 3 [IU] via SUBCUTANEOUS
  Administered 2022-10-13: 1 [IU] via SUBCUTANEOUS
  Administered 2022-10-13 – 2022-10-14 (×3): 2 [IU] via SUBCUTANEOUS
  Administered 2022-10-15: 1 [IU] via SUBCUTANEOUS
  Administered 2022-10-15 – 2022-10-16 (×2): 2 [IU] via SUBCUTANEOUS
  Administered 2022-10-17 – 2022-10-18 (×2): 1 [IU] via SUBCUTANEOUS

## 2022-10-05 MED ORDER — PANTOPRAZOLE SODIUM 40 MG PO TBEC
40.0000 mg | DELAYED_RELEASE_TABLET | Freq: Every day | ORAL | Status: DC
  Administered 2022-10-05 – 2022-10-18 (×14): 40 mg via ORAL
  Filled 2022-10-05 (×14): qty 1

## 2022-10-05 MED ORDER — EZETIMIBE 10 MG PO TABS
10.0000 mg | ORAL_TABLET | Freq: Every day | ORAL | Status: DC
  Administered 2022-10-05 – 2022-10-18 (×14): 10 mg via ORAL
  Filled 2022-10-05 (×14): qty 1

## 2022-10-05 MED ORDER — PROMETHAZINE HCL 25 MG PO TABS: 12.5000 mg | ORAL_TABLET | Freq: Four times a day (QID) | ORAL | Status: DC | PRN

## 2022-10-05 MED ORDER — MORPHINE SULFATE (PF) 4 MG/ML IV SOLN
4.0000 mg | INTRAVENOUS | Status: DC | PRN
  Filled 2022-10-05: qty 1

## 2022-10-05 MED ORDER — NALOXONE HCL 0.4 MG/ML IJ SOLN: 0.4000 mg | INTRAMUSCULAR | Status: DC | PRN

## 2022-10-05 MED ORDER — FLUOXETINE HCL 20 MG PO CAPS
20.0000 mg | ORAL_CAPSULE | Freq: Every day | ORAL | Status: DC
  Administered 2022-10-05 – 2022-10-18 (×14): 20 mg via ORAL
  Filled 2022-10-05 (×14): qty 1

## 2022-10-05 MED ORDER — ASPIRIN 81 MG PO TBEC
81.0000 mg | DELAYED_RELEASE_TABLET | Freq: Every day | ORAL | Status: DC
  Administered 2022-10-05 – 2022-10-18 (×14): 81 mg via ORAL
  Filled 2022-10-05 (×14): qty 1

## 2022-10-05 MED ORDER — HYDROXYZINE HCL 10 MG PO TABS
10.0000 mg | ORAL_TABLET | Freq: Three times a day (TID) | ORAL | Status: DC | PRN
  Filled 2022-10-05: qty 1

## 2022-10-05 MED ORDER — MESALAMINE 1000 MG RE SUPP
1000.0000 mg | Freq: Two times a day (BID) | RECTAL | Status: DC
  Administered 2022-10-05 – 2022-10-18 (×27): 1000 mg via RECTAL
  Filled 2022-10-05 (×28): qty 1

## 2022-10-05 NOTE — ED Notes (Signed)
ED TO INPATIENT HANDOFF REPORT  ED Nurse Name and Phone #: [phone]  S Name/Age/Gender [name] 62 y.o. female Room/Bed: 008C/008C  Code Status   Code Status: Full Code  Home/SNF/Other Home Patient oriented to: self, place, and time Is this baseline? Yes   Triage Complete: Triage complete  Chief Complaint CAP (community acquired pneumonia) [J18.9]  Triage Note Pt arrives via EMS from her GI appointment. EMS states that patient endorses chest pain, nausea, and multiple episodes of vomiting. Pt is reportedly from a SNF(ems unsure of which SNF).    Allergies No Known Allergies  Level of Care/Admitting Diagnosis ED Disposition     ED Disposition  Admit   Condition  --   Comment  Hospital Area: [facility] [[id number]]  Level of Care: Progressive [102]  Admit to Progressive based on following criteria: MULTISYSTEM THREATS such as stable sepsis, metabolic/electrolyte imbalance with or without encephalopathy that is responding to early treatment.  May admit patient to [facility] or [facility] if equivalent level of care is available:: No  Covid Evaluation: Asymptomatic - no recent exposure (last 10 days) testing not required  Diagnosis: CAP (community acquired pneumonia) [846962]  Admitting Physician: Angie Fava [[id number]]  Attending Physician: Angie Fava [[id number]]  Certification:: I certify this patient will need inpatient services for at least 2 midnights  Estimated Length of Stay: 2          B Medical/Surgery History Past Medical History:  Diagnosis Date   Abnormality of gait 05/10/2010   BACK PAIN 11/14/2008   Class 1 obesity due to excess calories with body mass index (BMI) of 31.0 to 31.9 in adult 02/07/2021   DIABETES MELLITUS, TYPE II 07/15/2008   Diplopia 07/15/2008   ECZEMA, ATOPIC 04/03/2009   GERD (gastroesophageal reflux disease)    Guillain-Barre (HCC) 1988   HYPERLIPIDEMIA 03/06/2009   HYPERTENSION 07/15/2008    Stroke (HCC) 2010, 2011   x2    Vertebral artery stenosis    Past Surgical History:  Procedure Laterality Date   ABDOMINAL HYSTERECTOMY     BIOPSY  07/08/2022   Procedure: BIOPSY;  Surgeon: Benancio Deeds, MD;  Location: [facility];  Service: Gastroenterology;;   COLONOSCOPY WITH PROPOFOL N/A 07/08/2022   Procedure: COLONOSCOPY WITH PROPOFOL;  Surgeon: Benancio Deeds, MD;  Location: [facility];  Service: Gastroenterology;  Laterality: N/A;   DILATION AND CURETTAGE OF UTERUS     FOOT SURGERY     IR ANGIO INTRA EXTRACRAN SEL COM CAROTID INNOMINATE UNI L MOD SED  09/17/2021   IR ANGIO INTRA EXTRACRAN SEL INTERNAL CAROTID UNI R MOD SED  09/17/2021   IR ANGIO VERTEBRAL SEL VERTEBRAL UNI R MOD SED  09/17/2021   IR US GUIDE VASC ACCESS RIGHT  09/17/2021     A IV Location/Drains/Wounds Patient Lines/Drains/Airways Status     Active Line/Drains/Airways     Name Placement date Placement time Site Days   Peripheral IV 10/04/22 18 G 1.88" Anterior;Distal;Right;Upper Arm 10/04/22  1617  Arm  1   Wound / Incision (Open or Dehisced) 06/20/22 Non-pressure wound;Other (Comment) Bilateral 06/20/22  1022  --  107            Intake/Output Last 24 hours  Intake/Output Summary (Last 24 hours) at 10/05/2022 0451 Last data filed at 10/04/2022 1908 Gross per 24 hour  Intake 1359.51 ml  Output --  Net 1359.51 ml    Labs/Imaging Results for orders placed or performed during the hospital encounter of  10/04/22 (from the past 48 hour(s))  Basic metabolic panel     Status: Abnormal   Collection Time: 10/04/22  4:21 PM  Result Value Ref Range   Sodium 136 135 - 145 mmol/L   Potassium 4.2 3.5 - 5.1 mmol/L   Chloride 102 98 - 111 mmol/L   CO2 21 (L) 22 - 32 mmol/L   Glucose, Bld 231 (H) 70 - 99 mg/dL    Comment: Glucose reference range applies only to samples taken after fasting for at least 8 hours.   BUN 20 8 - 23 mg/dL   Creatinine, Ser 1.61 0.44 - 1.00 mg/dL   Calcium 09.6 8.9 -  04.5 mg/dL   GFR, Estimated >40 >98 mL/min    Comment: (NOTE) Calculated using the CKD-EPI Creatinine Equation (2021)    Anion gap 13 5 - 15    Comment: Performed at [facility], [address] [place], [place] [address]  CBC     Status: Abnormal   Collection Time: 10/04/22  4:21 PM  Result Value Ref Range   WBC 14.7 (H) 4.0 - 10.5 K/uL   RBC 4.96 3.87 - 5.11 MIL/uL   Hemoglobin 12.6 12.0 - 15.0 g/dL   HCT 78.2 95.6 - 21.3 %   MCV 79.0 (L) 80.0 - 100.0 fL   MCH 25.4 (L) 26.0 - 34.0 pg   MCHC 32.1 30.0 - 36.0 g/dL   RDW 08.6 (H) 57.8 - 46.9 %   Platelets 393 150 - 400 K/uL   nRBC 0.0 0.0 - 0.2 %    Comment: Performed at [facility], [address], [place], [place] [address]  Troponin I (High Sensitivity)     Status: None   Collection Time: 10/04/22  4:21 PM  Result Value Ref Range   Troponin I (High Sensitivity) 15 <18 ng/L    Comment: (NOTE) Elevated high sensitivity troponin I (hsTnI) values and significant  changes across serial measurements may suggest ACS but many other  chronic and acute conditions are known to elevate hsTnI results.  Refer to the "Links" section for chest pain algorithms and additional  guidance. Performed at [facility], [address] [place], [place] [address]   Brain natriuretic peptide     Status: None   Collection Time: 10/04/22  4:21 PM  Result Value Ref Range   B Natriuretic Peptide 45.3 0.0 - 100.0 pg/mL    Comment: Performed at [facility], [address], [place], [place] [address]  Hepatic function panel     Status: Abnormal   Collection Time: 10/04/22  4:21 PM  Result Value Ref Range   Total Protein 8.1 6.5 - 8.1 g/dL   Albumin 3.2 (L) 3.5 - 5.0 g/dL   AST 94 (H) 15 - 41 U/L   ALT 180 (H) 0 - 44 U/L   Alkaline Phosphatase 152 (H) 38 - 126 U/L   Total Bilirubin 0.5 0.3 - 1.2 mg/dL   Bilirubin, Direct <2.7 0.0 - 0.2 mg/dL   Indirect Bilirubin NOT CALCULATED 0.3 - 0.9 mg/dL    Comment: Performed at [facility], [address], [place], [place] [address]  Lactic acid, plasma     Status: Abnormal   Collection Time: 10/04/22  4:26 PM  Result Value Ref Range   Lactic Acid, Venous 2.9 (HH) 0.5 - 1.9 mmol/L    Comment: CRITICAL RESULT CALLED TO, READ BACK BY AND VERIFIED WITH MCIVER, M. RN @ [phone] 10/04/22  JBUTLER Performed at [facility], [address], [place], [place] [address]   Lactic acid, plasma     Status: None   Collection Time: 10/04/22  6:19 PM  Result Value Ref Range   Lactic Acid, Venous 1.5 0.5 - 1.9 mmol/L    Comment: Performed at [facility], [address] [place], [place] [address]  Troponin I (High Sensitivity)     Status: None   Collection Time: 10/04/22  6:19 PM  Result Value Ref Range   Troponin I (High Sensitivity) 14 <18 ng/L    Comment: (NOTE) Elevated high sensitivity troponin I (hsTnI) values and significant  changes across serial measurements may suggest ACS but many other  chronic and acute conditions are known to elevate hsTnI results.  Refer to the "Links" section for chest pain algorithms and additional  guidance. Performed at [facility], [address], [place], [place] [address]   Magnesium     Status: None   Collection Time: 10/04/22  6:19 PM  Result Value Ref Range   Magnesium 2.0 1.7 - 2.4 mg/dL    Comment: Performed at [facility], [address] [place], [place] [address]  Urinalysis, w/ Reflex to Culture (Infection Suspected) -Urine, Unspecified Source     Status: Abnormal   Collection Time: 10/04/22 10:24 PM  Result Value Ref Range   Specimen Source URINE, UNSPE    Color, Urine YELLOW YELLOW   APPearance CLOUDY (A) CLEAR   Specific Gravity, Urine 1.023 1.005 - 1.030   pH 7.0 5.0 - 8.0   Glucose, UA 50 (A) NEGATIVE mg/dL   Hgb urine dipstick MODERATE (A) NEGATIVE   Bilirubin Urine NEGATIVE NEGATIVE   Ketones, ur NEGATIVE NEGATIVE mg/dL   Protein, ur 295 (A) NEGATIVE mg/dL   Nitrite NEGATIVE NEGATIVE    Leukocytes,Ua LARGE (A) NEGATIVE   RBC / HPF >50 0 - 5 RBC/hpf   WBC, UA >50 0 - 5 WBC/hpf    Comment:        Reflex urine culture not performed if WBC <=10, OR if Squamous epithelial cells >5. If Squamous epithelial cells >5 suggest recollection.    Bacteria, UA NONE SEEN NONE SEEN   Squamous Epithelial / HPF 6-10 0 - 5 /HPF   WBC Clumps PRESENT    Mucus PRESENT    Non Squamous Epithelial 0-5 (A) NONE SEEN    Comment: Performed at [facility], [address], [place], [place] [address]  CBC with Differential/Platelet     Status: Abnormal   Collection Time: 10/05/22 12:58 AM  Result Value Ref Range   WBC 11.0 (H) 4.0 - 10.5 K/uL   RBC 3.92 3.87 - 5.11 MIL/uL   Hemoglobin 10.0 (L) 12.0 - 15.0 g/dL   HCT 86.5 (L) 78.4 - 69.6 %   MCV 78.6 (L) 80.0 - 100.0 fL   MCH 25.5 (L) 26.0 - 34.0 pg   MCHC 32.5 30.0 - 36.0 g/dL   RDW 29.5 (H) 28.4 - 13.2 %   Platelets 301 150 - 400 K/uL   nRBC 0.0 0.0 - 0.2 %   Neutrophils Relative % 62 %   Neutro Abs 6.8 1.7 - 7.7 K/uL   Lymphocytes Relative 26 %   Lymphs Abs 2.9 0.7 - 4.0 K/uL   Monocytes Relative 12 %   Monocytes Absolute 1.3 (H) 0.1 - 1.0 K/uL   Eosinophils Relative 0 %   Eosinophils Absolute 0.0 0.0 - 0.5 K/uL  Basophils Relative 0 %   Basophils Absolute 0.0 0.0 - 0.1 K/uL   Immature Granulocytes 0 %   Abs Immature Granulocytes 0.04 0.00 - 0.07 K/uL    Comment: Performed at [facility], [address], [place], [place] [address]  Comprehensive metabolic panel     Status: Abnormal   Collection Time: 10/05/22 12:58 AM  Result Value Ref Range   Sodium 138 135 - 145 mmol/L   Potassium 4.3 3.5 - 5.1 mmol/L   Chloride 107 98 - 111 mmol/L   CO2 24 22 - 32 mmol/L   Glucose, Bld 168 (H) 70 - 99 mg/dL    Comment: Glucose reference range applies only to samples taken after fasting for at least 8 hours.   BUN 15 8 - 23 mg/dL   Creatinine, Ser 6.04 0.44 - 1.00 mg/dL   Calcium 9.3 8.9 - 54.0 mg/dL   Total Protein 6.2 (L)  6.5 - 8.1 g/dL   Albumin 2.5 (L) 3.5 - 5.0 g/dL   AST 62 (H) 15 - 41 U/L   ALT 129 (H) 0 - 44 U/L   Alkaline Phosphatase 111 38 - 126 U/L   Total Bilirubin 0.4 0.3 - 1.2 mg/dL   GFR, Estimated >98 >11 mL/min    Comment: (NOTE) Calculated using the CKD-EPI Creatinine Equation (2021)    Anion gap 7 5 - 15    Comment: Performed at [facility], [address] [place], [place] [address]  Magnesium     Status: None   Collection Time: 10/05/22 12:58 AM  Result Value Ref Range   Magnesium 2.0 1.7 - 2.4 mg/dL    Comment: Performed at [facility], [address], [place], [place] [address]  Procalcitonin     Status: None   Collection Time: 10/05/22 12:58 AM  Result Value Ref Range   Procalcitonin 0.75 ng/mL    Comment:        Interpretation: PCT > 0.5 ng/mL and <= 2 ng/mL: Systemic infection (sepsis) is possible, but other conditions are known to elevate PCT as well. (NOTE)       Sepsis PCT Algorithm           Lower Respiratory Tract                                      Infection PCT Algorithm    ----------------------------     ----------------------------         PCT < 0.25 ng/mL                PCT < 0.10 ng/mL          Strongly encourage             Strongly discourage   discontinuation of antibiotics    initiation of antibiotics    ----------------------------     -----------------------------       PCT 0.25 - 0.50 ng/mL            PCT 0.10 - 0.25 ng/mL               OR       >80% decrease in PCT            Discourage initiation of  antibiotics      Encourage discontinuation           of antibiotics    ----------------------------     -----------------------------         PCT >= 0.50 ng/mL              PCT 0.26 - 0.50 ng/mL                AND       <80% decrease in PCT             Encourage initiation of                                             antibiotics       Encourage continuation           of antibiotics     ----------------------------     -----------------------------        PCT >= 0.50 ng/mL                  PCT > 0.50 ng/mL               AND         increase in PCT                  Strongly encourage                                      initiation of antibiotics    Strongly encourage escalation           of antibiotics                                     -----------------------------                                           PCT <= 0.25 ng/mL                                                 OR                                        > 80% decrease in PCT                                      Discontinue / Do not initiate                                             antibiotics  Performed at [facility], [address] [place], [place] [address]   Phosphorus     Status: None   Collection Time:  10/05/22 12:58 AM  Result Value Ref Range   Phosphorus 3.5 2.5 - 4.6 mg/dL    Comment: Performed at [facility], [address] [place], [place] [address]  Protime-INR     Status: None   Collection Time: 10/05/22 12:58 AM  Result Value Ref Range   Prothrombin Time 13.9 11.4 - 15.2 seconds   INR 1.1 0.8 - 1.2    Comment: (NOTE) INR goal varies based on device and disease states. Performed at [facility], [address] [place], [place] [address]    CT Angio Chest PE W and/or Wo Contrast  Result Date: 10/04/2022 CLINICAL DATA:  Pulmonary embolism suspected. EXAM: CT ANGIOGRAPHY CHEST WITH CONTRAST TECHNIQUE: Multidetector CT imaging of the chest was performed using the standard protocol during bolus administration of intravenous contrast. Multiplanar CT image reconstructions and MIPs were obtained to evaluate the vascular anatomy. RADIATION DOSE REDUCTION: This exam was performed according to the departmental dose-optimization program which includes automated exposure control, adjustment of the mA and/or kV according to patient size and/or use of iterative reconstruction  technique. CONTRAST:  75mL OMNIPAQUE IOHEXOL 350 MG/ML SOLN COMPARISON:  None Available. FINDINGS: Cardiovascular: Satisfactory opacification of the pulmonary arteries to the segmental level. No evidence of pulmonary embolism. Normal heart size. No pericardial effusion. Main pulmonary trunk is dilated measuring up to 3.4 cm concerning for pulmonary arterial hypertension. Aberrant origin of the right subclavian artery. Mediastinum/Nodes: Bulky bilateral axillary, prevascular, right hilar, subcarinal lymph nodes, not significantly changed since prior examination, concerning for lymphoma. Lungs/Pleura: Bilateral lower lobe ground-glass and patchy lung opacities with bronchial thickening concerning for bilateral lower lobe pneumonia. No pleural effusion or pneumothorax. Upper Abdomen: No acute abnormality. Musculoskeletal: No chest wall abnormality. No acute or significant osseous findings. Review of the MIP images confirms the above findings. IMPRESSION: 1. No CT evidence of pulmonary embolism. 2. Bilateral lower lobe ground-glass and patchy lung opacities with bronchial thickening concerning for bilateral lower lobe pneumonia. 3. Bulky bilateral axillary, prevascular, right hilar, subcarinal lymph nodes, not significantly changed since prior examination, concerning for lymphoma. 4. Dilated main pulmonary trunk measuring up to 3.4 cm concerning for pulmonary arterial hypertension. 5. Aberrant origin of the right subclavian artery. Electronically Signed   By: Larose Hires D.O.   On: 10/04/2022 19:25   CT ABDOMEN PELVIS W CONTRAST  Result Date: 10/04/2022 CLINICAL DATA:  Bowel obstruction suspected. Emesis. History of CLL. EXAM: CT ABDOMEN AND PELVIS WITH CONTRAST TECHNIQUE: Multidetector CT imaging of the abdomen and pelvis was performed using the standard protocol following bolus administration of intravenous contrast. RADIATION DOSE REDUCTION: This exam was performed according to the departmental dose-optimization  program which includes automated exposure control, adjustment of the mA and/or kV according to patient size and/or use of iterative reconstruction technique. CONTRAST:  75mL OMNIPAQUE IOHEXOL 350 MG/ML SOLN COMPARISON:  Abdominal ultrasound 09/24/2022. CT angiogram chest abdomen and pelvis 05/29/2022. FINDINGS: Lower chest: There are minimal patchy airspace and ground-glass opacities in both lower lobes, likely infectious/inflammatory. Hepatobiliary: No focal liver abnormality is seen. No gallstones, gallbladder wall thickening, or biliary dilatation. Pancreas: Unremarkable. No pancreatic ductal dilatation or surrounding inflammatory changes. Spleen: Normal in size without focal abnormality. Adrenals/Urinary Tract: There is indistinctness of the corticomedullary enhancement pattern both kidneys with mild bilateral perinephric fat stranding, right greater than left. There is no hydronephrosis or perinephric fluid collection. Bilateral renal cysts are present measuring up 2 4.3 cm in the inferior pole the left kidney. Appearance is similar to  the prior study. The adrenal glands and bladder are within normal limits. Stomach/Bowel: There is inferior rectal wall thickening inflammation, new from prior. There are new small perirectal lymph nodes. There is no bowel obstruction, pneumatosis or free air. Appendix, stomach and small bowel are within normal limits. Vascular/Lymphatic: Aorta and IVC are normal in size. There are atherosclerotic calcifications of the aorta. There is diffuse retroperitoneal lymphadenopathy with the largest lymph node in the aortocaval region measuring 2.7 x 1.8 cm, similar to the prior study. Retrocaval lymphadenopathy has slightly increased for example image 3/40 measuring 16 mm short axis (previously 13). Right iliac chain lymphadenopathy has increased measuring 3.5 by 4.7 cm (previously 2.9 by 3.5 cm). Left iliac chain lymphadenopathy measures up to 2.1 cm (previously 18 mm). Bilateral pelvic  sidewall lymphadenopathy has increased, right greater than left. The largest lymph node is along the right pelvic sidewall measuring 3.0 x 4.7 cm (previously 1.1 x 3.6 cm). There is new bilateral inguinal lymphadenopathy, right greater than left, measuring up to 16 mm short axis. Additionally, there is increasing periportal lymphadenopathy with largest lymph node measuring up to 2 cm (previously 17 mm). Bulky central mesenteric lymphadenopathy is mildly diffusely increased. Right lower quadrant mesenteric lymphadenopathy has also mildly diffusely increased. Reproductive: Status post hysterectomy. No adnexal masses. Other: There is a small fat containing umbilical hernia. There is no ascites. Musculoskeletal: Bones are diffusely heterogeneous, unchanged. No acute fractures are seen. IMPRESSION: 1. There is new inferior rectal wall thickening and inflammation with new perirectal lymph nodes. Findings are worrisome for proctitis. Neoplasm not excluded. 2. Increased diffuse retroperitoneal, mesenteric, pelvic sidewall, inguinal, and periportal lymphadenopathy. 3. Bilateral perinephric fat stranding with indistinctness of the corticomedullary enhancement pattern of both kidneys worrisome for pyelonephritis. No hydronephrosis or perinephric fluid collection. 4. Minimal patchy airspace and ground-glass opacities in both lower lobes, likely infectious/inflammatory. 5. Stable heterogeneous appearance of the bones, likely related to patient's history of CLL. Aortic Atherosclerosis (ICD10-I70.0). Electronically Signed   By: Darliss Cheney M.D.   On: 10/04/2022 19:21   DG Chest Port 1 View  Result Date: 10/04/2022 CLINICAL DATA:  Shortness of breath EXAM: PORTABLE CHEST 1 VIEW COMPARISON:  Radiograph and CT 09/23/2022 FINDINGS: Stable cardiomediastinal silhouette. Aortic atherosclerotic calcification. Scarring left mid lung. No focal consolidation, pleural effusion, or pneumothorax. Prominent right hilum similar to prior  radiograph and likely related to adenopathy as seen on CT 09/24/2022. No displaced rib fractures. IMPRESSION: 1. No active disease. 2. Prominent right hilum likely related to adenopathy as seen on CT 09/24/2022. Electronically Signed   By: Minerva Fester M.D.   On: 10/04/2022 17:31    Pending Labs Unresulted Labs (From admission, onward)     Start     Ordered   10/05/22 0038  Legionella Pneumophila Serogp 1 Ur Ag  Add-on,   AD        10/05/22 0037   10/05/22 0038  Strep pneumoniae urinary antigen  Add-on,   AD        10/05/22 0037   10/04/22 1610  Culture, blood (routine x 2)  BLOOD CULTURE X 2,   R      10/04/22 1610            Vitals/Pain Today's Vitals   10/05/22 0015 10/05/22 0030 10/05/22 0137 10/05/22 0445  BP: 137/89 (!) 143/93  (!) 149/91  Pulse: (!) 108 (!) 110  (!) 103  Resp: (!) 27 (!) 26  (!) 26  Temp:   98.7 F (37.1 C)  TempSrc:   Oral   SpO2: 100% 100%  100%    Isolation Precautions No active isolations  Medications Medications  acetaminophen (TYLENOL) tablet 650 mg (has no administration in time range)    Or  acetaminophen (TYLENOL) suppository 650 mg (has no administration in time range)  melatonin tablet 3 mg (has no administration in time range)  ondansetron (ZOFRAN) injection 4 mg (4 mg Intravenous Given 10/05/22 0313)  lactated ringers infusion ( Intravenous Not Given 10/04/22 2112)  azithromycin (ZITHROMAX) 500 mg in sodium chloride 0.9 % 250 mL IVPB (0 mg Intravenous Stopped 10/04/22 2250)  cefTRIAXone (ROCEPHIN) 1 g in sodium chloride 0.9 % 100 mL IVPB (has no administration in time range)  metroNIDAZOLE (FLAGYL) IVPB 500 mg (has no administration in time range)  insulin aspart (novoLOG) injection 0-9 Units (has no administration in time range)  aspirin EC tablet 81 mg (has no administration in time range)  ezetimibe (ZETIA) tablet 10 mg (has no administration in time range)  FLUoxetine (PROZAC) capsule 20 mg (has no administration in time range)   hydrOXYzine (ATARAX) tablet 10 mg (has no administration in time range)  mesalamine (CANASA) suppository 1,000 mg (has no administration in time range)  pantoprazole (PROTONIX) EC tablet 40 mg (has no administration in time range)  naloxone [facility]) injection 0.4 mg (has no administration in time range)  morphine (PF) 4 MG/ML injection 4 mg (4 mg Intravenous Not Given 10/05/22 0312)  ondansetron (ZOFRAN-ODT) disintegrating tablet 4 mg (4 mg Oral Given 10/04/22 1622)  lactated ringers bolus 1,000 mL ( Intravenous Stopped 10/04/22 1719)  lactated ringers bolus 1,000 mL (0 mLs Intravenous Stopped 10/04/22 2112)  iohexol (OMNIPAQUE) 350 MG/ML injection 75 mL (75 mLs Intravenous Contrast Given 10/04/22 1902)  cefTRIAXone (ROCEPHIN) 2 g in sodium chloride 0.9 % 100 mL IVPB (0 g Intravenous Stopped 10/04/22 2111)  metroNIDAZOLE (FLAGYL) IVPB 500 mg (0 mg Intravenous Stopped 10/04/22 2150)    Mobility non-ambulatory     Focused Assessments Cardiac Assessment Handoff:  Cardiac Rhythm: Sinus tachycardia Lab Results  Component Value Date   CKTOTAL 79 02/07/2021   CKMB 1.3 05/10/2010   TROPONINI 0.02        NO INDICATION OF MYOCARDIAL INJURY. 05/10/2010   No results found for: "DDIMER" Does the Patient currently have chest pain? No    R Recommendations: See Admitting Provider Note  Report given to:   Additional Notes:

## 2022-10-05 NOTE — Progress Notes (Signed)
PROGRESS NOTE    [name]  UJW:[id number] DOB: 03-Dec-1960 DOA: 10/04/2022 PCP: [place], [name], MD   Brief Narrative:  This 62 yrs old female with PMH significant for type 2 diabetes mellitus, essential hypertension, hyperlipidemia, CVA with residual left sided weakness, presented in the ED with complaints of shortness of breath as well as nonproductive cough for last 3 days. Patient was recently hospitalized, with hospital course reported to include aspiration pneumonia as well as proctitis.  She completed a course of Unasyn for these processes ,She was discharged on mesalamine suppositories, daily.  She has an outpatient follow-up with [facility] where she was found to have tachycardia with heart rate in 140s to 150s.  Patient was sent in the ED for further evaluation. CTA chest ruled out pulmonary embolism but did show evidence of bilateral lower lobe airspace opacities concerning for pneumonia.  CT abdomen and pelvis shows findings consistent with proctitis, no bowel obstruction or abscess or perforation. Patient was admitted for sepsis secondary to community-acquired pneumonia and proctitis started on empiric antibiotics(ceftriaxone, Zithromax and Flagyl.  Assessment & Plan:   Principal Problem:   CAP (community acquired pneumonia) Active Problems:   Essential hypertension   DM2 (diabetes mellitus, type 2) (HCC)   HLD (hyperlipidemia)   Severe sepsis (HCC)   Dehydration   Acute prerenal azotemia   Proctitis   Hypercalcemia   Transaminitis   Pyuria   Depression  Severe sepsis secondary to community-acquired pneumonia: Patient presented with cough, shortness of breath, subjective fever x 3 days,  CTA chest showed bilateral lower lobe opacities consistent with pneumonia. She presented with leukocytosis, tachycardia, tachypnea, lactic acid 2.9, PNA on CT chest. Lactic acid improved with IV fluid resuscitation. Continue empiric antibiotics (Ceftriaxone,  Zithromax, Flagyl) Follow blood cultures. Continue IV fluid resuscitation. Check strep pneumo and Legionella urinary antigen. UA: showed large leucocytes.  Dehydration: Patient clinically appears dehydrated. Continue IV fluid resuscitation.  Proctitis: Patient was recently hospitalized and found to have proctitis. She was discharged on mesalamine suppositories.   CT abdomen showed findings consistent with persistent proctitis. Gastroenterology recommended to increase mesalamine suppository to twice daily. Defer flexible sigmoidoscopy at this time.  Hypercalcemia: She was found to have calcium 10.1 on admission. Continue IV fluid resuscitation. Serum calcium has improved.  Elevated liver enzymes: CT abdomen showed no evidence of gallbladder pathology or any acute hepatic involvement. Hold statin dose. Follow-up liver enzymes.  Type 2 diabetes: Regular insulin sliding scale. Hold Trulicity.  Essential hypertension Hold blood pressure medication in the setting of sepsis. Resume when BP improves.  Hyperlipidemia: Hold statin in the setting of elevated liver enzymes.  Resume once LFTs improved.  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  CVA with left-sided weakness: Continue aspirin , resume statin once LFTs improved  DVT prophylaxis: SCDs Code Status:Full code Family Communication: No family at bed side) Disposition Plan:    Status is: Inpatient Remains inpatient appropriate because: Admitted for recurrent pneumonia with sepsis, requiring IV antibiotics.    Consultants:  None  Procedures: CT Chest, CT A/P  Antimicrobials:  Anti-infectives (From admission, onward)    Start     Dose/Rate Route Frequency Ordered Stop   10/05/22 1400  cefTRIAXone (ROCEPHIN) 1 g in sodium chloride 0.9 % 100 mL IVPB        1 g 200 mL/hr over 30 Minutes Intravenous Every 24 hours 10/04/22 2030     10/05/22 0800  metroNIDAZOLE (FLAGYL) IVPB 500 mg        500 mg 100  mL/hr over 60  Minutes Intravenous Every 12 hours 10/04/22 2030     10/04/22 2045  azithromycin (ZITHROMAX) 500 mg in sodium chloride 0.9 % 250 mL IVPB        500 mg 250 mL/hr over 60 Minutes Intravenous Every 24 hours 10/04/22 2030     10/04/22 2000  cefTRIAXone (ROCEPHIN) 2 g in sodium chloride 0.9 % 100 mL IVPB        2 g 200 mL/hr over 30 Minutes Intravenous  Once 10/04/22 1956 10/04/22 2111   10/04/22 2000  metroNIDAZOLE (FLAGYL) IVPB 500 mg        500 mg 100 mL/hr over 60 Minutes Intravenous  Once 10/04/22 1956 10/04/22 2150      Subjective: Patient seen and examined at bedside.  Overnight events noted.   She reports doing better on room air, She has left-sided weakness from CVA.  Objective: Vitals:   10/05/22 0445 10/05/22 0535 10/05/22 0600 10/05/22 0741  BP: (!) 149/91 127/84 129/78 (!) 140/88  Pulse: (!) 103 100 93 98  Resp: (!) 26 (!) 27 (!) 25 (!) 24  Temp:  97.9 F (36.6 C)  97.9 F (36.6 C)  TempSrc:  Oral  Oral  SpO2: 100% 97% 94% 94%  Weight:  65.3 kg    Height:  5\' 4"  (1.626 m)      Intake/Output Summary (Last 24 hours) at 10/05/2022 1013 Last data filed at 10/04/2022 1908 Gross per 24 hour  Intake 1359.51 ml  Output --  Net 1359.51 ml   Filed Weights   10/05/22 0535  Weight: 65.3 kg    Examination:  General exam: Appears calm and comfortable, deconditioned, not in any acute distress. Respiratory system: CTA bilaterally. Respiratory effort normal.  RR 15 Cardiovascular system: S1 & S2 heard, regular rate and rhythm, no murmur Gastrointestinal system: Abdomen is soft, non tender, non distended, BS present Central nervous system: Alert and oriented X 3. No focal neurological deficits. Extremities: No edema, No cyanosis, No clubbing Skin: No rashes, lesions or ulcers Psychiatry: Judgement and insight appear normal. Mood & affect appropriate.     Data Reviewed: I have personally reviewed following labs and imaging studies  CBC: Recent Labs  Lab 10/04/22 1621  10/05/22 0058  WBC 14.7* 11.0*  NEUTROABS  --  6.8  HGB 12.6 10.0*  HCT 39.2 30.8*  MCV 79.0* 78.6*  PLT 393 301   Basic Metabolic Panel: Recent Labs  Lab 10/04/22 1621 10/04/22 1819 10/05/22 0058  NA 136  --  138  K 4.2  --  4.3  CL 102  --  107  CO2 21*  --  24  GLUCOSE 231*  --  168*  BUN 20  --  15  CREATININE 0.98  --  0.63  CALCIUM 10.1  --  9.3  MG  --  2.0 2.0  PHOS  --   --  3.5   GFR: Estimated Creatinine Clearance: 62.2 mL/min (by C-G formula based on SCr of 0.63 mg/dL). Liver Function Tests: Recent Labs  Lab 10/04/22 1621 10/05/22 0058  AST 94* 62*  ALT 180* 129*  ALKPHOS 152* 111  BILITOT 0.5 0.4  PROT 8.1 6.2*  ALBUMIN 3.2* 2.5*   No results for input(s): "LIPASE", "AMYLASE" in the last 168 hours. No results for input(s): "AMMONIA" in the last 168 hours. Coagulation Profile: Recent Labs  Lab 10/05/22 0058  INR 1.1   Cardiac Enzymes: No results for input(s): "CKTOTAL", "CKMB", "CKMBINDEX", "TROPONINI" in the last 168  hours. BNP (last 3 results) No results for input(s): "PROBNP" in the last 8760 hours. HbA1C: No results for input(s): "HGBA1C" in the last 72 hours. CBG: Recent Labs  Lab 10/05/22 0624  GLUCAP 138*   Lipid Profile: No results for input(s): "CHOL", "HDL", "LDLCALC", "TRIG", "CHOLHDL", "LDLDIRECT" in the last 72 hours. Thyroid Function Tests: No results for input(s): "TSH", "T4TOTAL", "FREET4", "T3FREE", "THYROIDAB" in the last 72 hours. Anemia Panel: No results for input(s): "VITAMINB12", "FOLATE", "FERRITIN", "TIBC", "IRON", "RETICCTPCT" in the last 72 hours. Sepsis Labs: Recent Labs  Lab 10/04/22 1626 10/04/22 1819 10/05/22 0058  PROCALCITON  --   --  0.75  LATICACIDVEN 2.9* 1.5  --     Recent Results (from the past 240 hour(s))  MRSA Next Gen by PCR, Nasal     Status: None   Collection Time: 10/05/22  5:34 AM   Specimen: Nasal Mucosa; Nasal Swab  Result Value Ref Range Status   MRSA by PCR Next Gen NOT  DETECTED NOT DETECTED Final    Comment: (NOTE) The GeneXpert MRSA Assay (FDA approved for NASAL specimens only), is one component of a comprehensive MRSA colonization surveillance program. It is not intended to diagnose MRSA infection nor to guide or monitor treatment for MRSA infections. Test performance is not FDA approved in patients less than 65 years old. Performed at [facility], [address], [place], [place] [address]     Radiology Studies: CT Angio Chest PE W and/or Wo Contrast  Result Date: 10/04/2022 CLINICAL DATA:  Pulmonary embolism suspected. EXAM: CT ANGIOGRAPHY CHEST WITH CONTRAST TECHNIQUE: Multidetector CT imaging of the chest was performed using the standard protocol during bolus administration of intravenous contrast. Multiplanar CT image reconstructions and MIPs were obtained to evaluate the vascular anatomy. RADIATION DOSE REDUCTION: This exam was performed according to the departmental dose-optimization program which includes automated exposure control, adjustment of the mA and/or kV according to patient size and/or use of iterative reconstruction technique. CONTRAST:  75mL OMNIPAQUE IOHEXOL 350 MG/ML SOLN COMPARISON:  None Available. FINDINGS: Cardiovascular: Satisfactory opacification of the pulmonary arteries to the segmental level. No evidence of pulmonary embolism. Normal heart size. No pericardial effusion. Main pulmonary trunk is dilated measuring up to 3.4 cm concerning for pulmonary arterial hypertension. Aberrant origin of the right subclavian artery. Mediastinum/Nodes: Bulky bilateral axillary, prevascular, right hilar, subcarinal lymph nodes, not significantly changed since prior examination, concerning for lymphoma. Lungs/Pleura: Bilateral lower lobe ground-glass and patchy lung opacities with bronchial thickening concerning for bilateral lower lobe pneumonia. No pleural effusion or pneumothorax. Upper Abdomen: No acute abnormality. Musculoskeletal: No chest  wall abnormality. No acute or significant osseous findings. Review of the MIP images confirms the above findings. IMPRESSION: 1. No CT evidence of pulmonary embolism. 2. Bilateral lower lobe ground-glass and patchy lung opacities with bronchial thickening concerning for bilateral lower lobe pneumonia. 3. Bulky bilateral axillary, prevascular, right hilar, subcarinal lymph nodes, not significantly changed since prior examination, concerning for lymphoma. 4. Dilated main pulmonary trunk measuring up to 3.4 cm concerning for pulmonary arterial hypertension. 5. Aberrant origin of the right subclavian artery. Electronically Signed   By: Larose Hires D.O.   On: 10/04/2022 19:25   CT ABDOMEN PELVIS W CONTRAST  Result Date: 10/04/2022 CLINICAL DATA:  Bowel obstruction suspected. Emesis. History of CLL. EXAM: CT ABDOMEN AND PELVIS WITH CONTRAST TECHNIQUE: Multidetector CT imaging of the abdomen and pelvis was performed using the standard protocol following bolus administration of intravenous contrast. RADIATION DOSE REDUCTION: This exam was  performed according to the departmental dose-optimization program which includes automated exposure control, adjustment of the mA and/or kV according to patient size and/or use of iterative reconstruction technique. CONTRAST:  75mL OMNIPAQUE IOHEXOL 350 MG/ML SOLN COMPARISON:  Abdominal ultrasound 09/24/2022. CT angiogram chest abdomen and pelvis 05/29/2022. FINDINGS: Lower chest: There are minimal patchy airspace and ground-glass opacities in both lower lobes, likely infectious/inflammatory. Hepatobiliary: No focal liver abnormality is seen. No gallstones, gallbladder wall thickening, or biliary dilatation. Pancreas: Unremarkable. No pancreatic ductal dilatation or surrounding inflammatory changes. Spleen: Normal in size without focal abnormality. Adrenals/Urinary Tract: There is indistinctness of the corticomedullary enhancement pattern both kidneys with mild bilateral perinephric  fat stranding, right greater than left. There is no hydronephrosis or perinephric fluid collection. Bilateral renal cysts are present measuring up 2 4.3 cm in the inferior pole the left kidney. Appearance is similar to the prior study. The adrenal glands and bladder are within normal limits. Stomach/Bowel: There is inferior rectal wall thickening inflammation, new from prior. There are new small perirectal lymph nodes. There is no bowel obstruction, pneumatosis or free air. Appendix, stomach and small bowel are within normal limits. Vascular/Lymphatic: Aorta and IVC are normal in size. There are atherosclerotic calcifications of the aorta. There is diffuse retroperitoneal lymphadenopathy with the largest lymph node in the aortocaval region measuring 2.7 x 1.8 cm, similar to the prior study. Retrocaval lymphadenopathy has slightly increased for example image 3/40 measuring 16 mm short axis (previously 13). Right iliac chain lymphadenopathy has increased measuring 3.5 by 4.7 cm (previously 2.9 by 3.5 cm). Left iliac chain lymphadenopathy measures up to 2.1 cm (previously 18 mm). Bilateral pelvic sidewall lymphadenopathy has increased, right greater than left. The largest lymph node is along the right pelvic sidewall measuring 3.0 x 4.7 cm (previously 1.1 x 3.6 cm). There is new bilateral inguinal lymphadenopathy, right greater than left, measuring up to 16 mm short axis. Additionally, there is increasing periportal lymphadenopathy with largest lymph node measuring up to 2 cm (previously 17 mm). Bulky central mesenteric lymphadenopathy is mildly diffusely increased. Right lower quadrant mesenteric lymphadenopathy has also mildly diffusely increased. Reproductive: Status post hysterectomy. No adnexal masses. Other: There is a small fat containing umbilical hernia. There is no ascites. Musculoskeletal: Bones are diffusely heterogeneous, unchanged. No acute fractures are seen. IMPRESSION: 1. There is new inferior rectal  wall thickening and inflammation with new perirectal lymph nodes. Findings are worrisome for proctitis. Neoplasm not excluded. 2. Increased diffuse retroperitoneal, mesenteric, pelvic sidewall, inguinal, and periportal lymphadenopathy. 3. Bilateral perinephric fat stranding with indistinctness of the corticomedullary enhancement pattern of both kidneys worrisome for pyelonephritis. No hydronephrosis or perinephric fluid collection. 4. Minimal patchy airspace and ground-glass opacities in both lower lobes, likely infectious/inflammatory. 5. Stable heterogeneous appearance of the bones, likely related to patient's history of CLL. Aortic Atherosclerosis (ICD10-I70.0). Electronically Signed   By: Darliss Cheney M.D.   On: 10/04/2022 19:21   DG Chest Port 1 View  Result Date: 10/04/2022 CLINICAL DATA:  Shortness of breath EXAM: PORTABLE CHEST 1 VIEW COMPARISON:  Radiograph and CT 09/23/2022 FINDINGS: Stable cardiomediastinal silhouette. Aortic atherosclerotic calcification. Scarring left mid lung. No focal consolidation, pleural effusion, or pneumothorax. Prominent right hilum similar to prior radiograph and likely related to adenopathy as seen on CT 09/24/2022. No displaced rib fractures. IMPRESSION: 1. No active disease. 2. Prominent right hilum likely related to adenopathy as seen on CT 09/24/2022. Electronically Signed   By: Minerva Fester M.D.   On: 10/04/2022 17:31  Scheduled Meds:  aspirin EC  81 mg Oral Daily   ezetimibe  10 mg Oral Daily   FLUoxetine  20 mg Oral Daily   insulin aspart  0-9 Units Subcutaneous TID WC   mesalamine  1,000 mg Rectal BID   pantoprazole  40 mg Oral Daily   Continuous Infusions:  azithromycin Stopped (10/04/22 2250)   cefTRIAXone (ROCEPHIN)  IV     metronidazole 500 mg (10/05/22 0950)     LOS: 1 day    Time spent: 50 mins.    Willeen Niece, MD [facility]   If 7PM-7AM, please contact night-coverage

## 2022-10-05 NOTE — ED Notes (Signed)
Pt is endorsing increased nausea and dry heaving. Night MD Howerter notified.

## 2022-10-06 DIAGNOSIS — J690 Pneumonitis due to inhalation of food and vomit: Secondary | ICD-10-CM

## 2022-10-06 DIAGNOSIS — J189 Pneumonia, unspecified organism: Secondary | ICD-10-CM | POA: Diagnosis not present

## 2022-10-06 LAB — GLUCOSE, CAPILLARY
Glucose-Capillary: 100 mg/dL — ABNORMAL HIGH (ref 70–99)
Glucose-Capillary: 122 mg/dL — ABNORMAL HIGH (ref 70–99)
Glucose-Capillary: 75 mg/dL (ref 70–99)
Glucose-Capillary: 132 mg/dL — ABNORMAL HIGH (ref 70–99)

## 2022-10-06 LAB — BASIC METABOLIC PANEL
Anion gap: 9 (ref 5–15)
BUN: 14 mg/dL (ref 8–23)
CO2: 22 mmol/L (ref 22–32)
Calcium: 9.2 mg/dL (ref 8.9–10.3)
Chloride: 107 mmol/L (ref 98–111)
Creatinine, Ser: 0.72 mg/dL (ref 0.44–1.00)
GFR, Estimated: >60 mL/min (ref >60)
Glucose, Bld: 110 mg/dL — ABNORMAL HIGH (ref 70–99)
Potassium: 3.5 mmol/L (ref 3.5–5.1)
Sodium: 138 mmol/L (ref 135–145)

## 2022-10-06 LAB — MAGNESIUM: Magnesium: 1.9 mg/dL (ref 1.7–2.4)

## 2022-10-06 LAB — CBC
HCT: 28.8 % — ABNORMAL LOW (ref 36.0–46.0)
Hemoglobin: 9.5 g/dL — ABNORMAL LOW (ref 12.0–15.0)
MCH: 26.3 pg (ref 26.0–34.0)
MCHC: 33 g/dL (ref 30.0–36.0)
MCV: 79.8 fL — ABNORMAL LOW (ref 80.0–100.0)
Platelets: 250 10*3/uL (ref 150–400)
RBC: 3.61 MIL/uL — ABNORMAL LOW (ref 3.87–5.11)
RDW: 16.1 % — ABNORMAL HIGH (ref 11.5–15.5)
WBC: 7.9 10*3/uL (ref 4.0–10.5)
nRBC: 0 % (ref 0.0–0.2)

## 2022-10-06 LAB — CULTURE, BLOOD (ROUTINE X 2)

## 2022-10-06 LAB — PHOSPHORUS: Phosphorus: 3.4 mg/dL (ref 2.5–4.6)

## 2022-10-06 MED ORDER — LIP MEDEX EX OINT
TOPICAL_OINTMENT | CUTANEOUS | Status: DC | PRN
  Filled 2022-10-06: qty 7

## 2022-10-06 MED ORDER — AZITHROMYCIN 500 MG PO TABS
500.0000 mg | ORAL_TABLET | ORAL | Status: DC
  Administered 2022-10-06: 500 mg via ORAL
  Filled 2022-10-06: qty 1

## 2022-10-06 MED ORDER — CHLORHEXIDINE GLUCONATE CLOTH 2 % EX PADS
6.0000 | MEDICATED_PAD | Freq: Every day | CUTANEOUS | Status: DC
  Administered 2022-10-06 – 2022-10-13 (×7): 6 via TOPICAL

## 2022-10-06 NOTE — Progress Notes (Signed)
Orthopedic Tech Progress Note Patient Details:  [name] 14-Feb-1961 [id number]  Ortho Devices Type of Ortho Device: Prafo boot/shoe Ortho Device/Splint Interventions: Ordered, Application, Adjustment   Post Interventions Patient Tolerated: Well Instructions Provided: Adjustment of device, Care of device  Sherilyn Banker 10/06/2022, 6:51 PM

## 2022-10-06 NOTE — Evaluation (Signed)
Clinical/Bedside Swallow Evaluation Patient Details  Name: [name] MRN: [id number] Date of Birth: 03-31-1961  Today's Date: 10/06/2022 Time: SLP Start Time (ACUTE ONLY): 1246 SLP Stop Time (ACUTE ONLY): 1303 SLP Time Calculation (min) (ACUTE ONLY): 17 min  Past Medical History:  Past Medical History:  Diagnosis Date   Abnormality of gait 05/10/2010   BACK PAIN 11/14/2008   Class 1 obesity due to excess calories with body mass index (BMI) of 31.0 to 31.9 in adult 02/07/2021   DIABETES MELLITUS, TYPE II 07/15/2008   Diplopia 07/15/2008   ECZEMA, ATOPIC 04/03/2009   GERD (gastroesophageal reflux disease)    Guillain-Barre (HCC) 1988   HYPERLIPIDEMIA 03/06/2009   HYPERTENSION 07/15/2008   Stroke (HCC) 2010, 2011   x2    Vertebral artery stenosis    Past Surgical History:  Past Surgical History:  Procedure Laterality Date   ABDOMINAL HYSTERECTOMY     BIOPSY  07/08/2022   Procedure: BIOPSY;  Surgeon: Benancio Deeds, MD;  Location: [facility];  Service: Gastroenterology;;   COLONOSCOPY WITH PROPOFOL N/A 07/08/2022   Procedure: COLONOSCOPY WITH PROPOFOL;  Surgeon: Benancio Deeds, MD;  Location: [facility];  Service: Gastroenterology;  Laterality: N/A;   DILATION AND CURETTAGE OF UTERUS     FOOT SURGERY     IR ANGIO INTRA EXTRACRAN SEL COM CAROTID INNOMINATE UNI L MOD SED  09/17/2021   IR ANGIO INTRA EXTRACRAN SEL INTERNAL CAROTID UNI R MOD SED  09/17/2021   IR ANGIO VERTEBRAL SEL VERTEBRAL UNI R MOD SED  09/17/2021   IR US GUIDE VASC ACCESS RIGHT  09/17/2021   HPI:  Pt is a 62 y.o. female who was admitted on 6/4 with severe sepsis due to community-acquired pneumonia after presenting with tachycardia which was noted during outpatient GI visit. CT chest 6/4: Bilateral lower lobe ground-glass and patchy lung opacities with bronchial thickening concerning for bilateral lower lobe pneumonia. PMH: type 2 diabetes mellitus, essential hypertension, hyperlipidemia, stroke. MBS  5/26: moderate oral dysphagia due to dentition impacting labial seal and resulting in anterior spillage of liquids. Pharyngeal dysphagia with delayed initaition of swallow even when a adequate oral bolus hold is achieved. Pt had sensed and silent penetration before the swallow with subsequent aspiration of penetrate when glottic closure is released. A dysphagia 3 diet with nectar thick liquids was recommended at that time. Pt discharged from hospital 5/28 and was on that diet at that time.    Assessment / Plan / Recommendation  Clinical Impression  Pt was seen for bedside swallow evaluation and she reported that she has been using the "nasty thickened stuff" since her discharge from the hospital. Her performance appears similar to that noted during the most recent MBS. Anterior dental protrusion reduced/impeded labial seal, mastication was mildly prolonged, and coughing was noted with thin liquids via cup, suggesting aspiration. Pt stated that she has preferred the regular texture diet from dysphagia 3; this will therefore be continued, but with nectar thick liquids. SLP will follow pt. SLP Visit Diagnosis: Dysphagia, oropharyngeal phase (R13.12)    Aspiration Risk  Mild aspiration risk;Moderate aspiration risk    Diet Recommendation Regular;Nectar-thick liquid   Liquid Administration via: Cup;Straw Medication Administration: Crushed with puree Supervision: Staff to assist with self feeding Compensations: Slow rate;Small sips/bites;Minimize environmental distractions Postural Changes: Seated upright at 90 degrees    Other  Recommendations Oral Care Recommendations: Oral care BID;Staff/trained caregiver to provide oral care    Recommendations for follow up therapy are one component of a multi-disciplinary  discharge planning process, led by the attending physician.  Recommendations may be updated based on patient status, additional functional criteria and insurance authorization.  Follow up  Recommendations Skilled nursing-short term rehab (<3 hours/day)      Assistance Recommended at Discharge    Functional Status Assessment Patient has had a recent decline in their functional status and demonstrates the ability to make significant improvements in function in a reasonable and predictable amount of time.  Frequency and Duration min 2x/week  2 weeks       Prognosis Prognosis for improved oropharyngeal function: Good      Swallow Study   General Date of Onset: 09/24/22 HPI: Pt is a 62 y.o. female who was admitted on 6/4 with severe sepsis due to community-acquired pneumonia after presenting with tachycardia which was noted during outpatient GI visit. CT chest 6/4: Bilateral lower lobe ground-glass and patchy lung opacities with bronchial thickening concerning for bilateral lower lobe pneumonia. PMH: type 2 diabetes mellitus, essential hypertension, hyperlipidemia, stroke. MBS 5/26: moderate oral dysphagia due to dentition impacting labial seal and resulting in anterior spillage of liquids. Pharyngeal dysphagia with delayed initaition of swallow even when a adequate oral bolus hold is achieved. Pt had sensed and silent penetration before the swallow with subsequent aspiration of penetrate when glottic closure is released. A dysphagia 3 diet with nectar thick liquids was recommended at that time. Pt discharged from hospital 5/28 and was on that diet at that time. Type of Study: Bedside Swallow Evaluation Previous Swallow Assessment: See HPI Diet Prior to this Study: Thin liquids (Level 0);Regular Temperature Spikes Noted: No Respiratory Status: Nasal cannula History of Recent Intubation: No Behavior/Cognition: Alert;Cooperative Oral Cavity Assessment: Within Functional Limits Oral Care Completed by SLP: Recent completion by staff Oral Cavity - Dentition: Missing dentition Vision: Functional for self-feeding Self-Feeding Abilities: Needs assist Patient Positioning: Upright in  bed Baseline Vocal Quality: Low vocal intensity;Hoarse Volitional Cough: Weak Volitional Swallow: Able to elicit    Oral/Motor/Sensory Function Overall Oral Motor/Sensory Function: Mild impairment Lingual Symmetry: Abnormal symmetry left Lingual Strength: Reduced   Ice Chips Ice chips: Impaired Presentation: Spoon Oral Phase Impairments: Reduced lingual movement/coordination Oral Phase Functional Implications: Prolonged oral transit   Thin Liquid Thin Liquid: Impaired Presentation: Cup Oral Phase Functional Implications: Prolonged oral transit Pharyngeal  Phase Impairments: Cough - Immediate;Wet Vocal Quality    Nectar Thick Nectar Thick Liquid: Not tested   Honey Thick Honey Thick Liquid: Not tested   Puree Puree: Impaired Presentation: Spoon Oral Phase Functional Implications: Prolonged oral transit   Solid     Solid: Impaired Presentation: Spoon Oral Phase Impairments: Impaired mastication;Reduced lingual movement/coordination Oral Phase Functional Implications: Impaired mastication;Prolonged oral transit     Jalen Daluz I. Vear Clock, MS, CCC-SLP Neuro Diagnostic Specialist  Acute Rehabilitation Services Office number: [phone]  Scheryl Marten 10/06/2022,2:15 PM

## 2022-10-06 NOTE — Evaluation (Signed)
Occupational Therapy Evaluation Patient Details Name: [name] MRN: [id number] DOB: 08/05/1960 Today's Date: 10/06/2022   History of Present Illness Pt is a 62 y.o. female who was admitted on 6/4 with severe sepsis due to community-acquired pneumonia after presenting with tachycardia which was noted during outpatient GI visit. CT chest 6/4: Bilateral lower lobe ground-glass and patchy lung opacities with bronchial thickening concerning for bilateral lower lobe pneumonia. PMH: type 2 diabetes mellitus, essential hypertension, hyperlipidemia, multiple strokes, GS 1988 with full recovery, NHL in remission   Clinical Impression   This 62 yo female admitted with above presents to acute OT with PLOF per her report of being able to self feed and do some of her grooming activities at bed level once items setup for her. Reports she was working on sitting, standing, and transferring at SNF pta. She will continue to benefit from acute OT with follow up continued inpatient follow up therapy, <3 hours/day.       Recommendations for follow up therapy are one component of a multi-disciplinary discharge planning process, led by the attending physician.  Recommendations may be updated based on patient status, additional functional criteria and insurance authorization.   Assistance Recommended at Discharge Frequent or constant Supervision/Assistance  Patient can return home with the following Two people to help with walking and/or transfers;A lot of help with bathing/dressing/bathroom;Assistance with cooking/housework;Assistance with feeding;Help with stairs or ramp for entrance;Assist for transportation;Direct supervision/assist for financial management;Direct supervision/assist for medications management    Functional Status Assessment  Patient has had a recent decline in their functional status and demonstrates the ability to make significant improvements in function in a reasonable and predictable amount  of time.  Equipment Recommendations  Other (comment) (TBD at next venue of care)       Precautions / Restrictions Precautions Precautions: Fall Restrictions Weight Bearing Restrictions: No      Mobility Bed Mobility Overal bed mobility: Needs Assistance Bed Mobility: Rolling           General bed mobility comments: Mod A to roll left, Max A to roll right           ADL either performed or assessed with clinical judgement   ADL Overall ADL's : Needs assistance/impaired Eating/Feeding: Set up;Supervision/ safety Eating/Feeding Details (indicate cue type and reason): with correct positioning in bed where she can easily access her food   Grooming Details (indicate cue type and reason): All in bed: Can wash her face while supine HOB up once wash cloth presented, can do mouth care with each step handed to her, needs total A for deodorant/doing hair, Mod A to wash hands   Upper Body Bathing Details (indicate cue type and reason): Staff do this for her at SNF   Lower Body Bathing Details (indicate cue type and reason): Staff do this for her at SNF   Upper Body Dressing Details (indicate cue type and reason): Staff do this for her at SNF   Lower Body Dressing Details (indicate cue type and reason): Staff do this for her at [facility]   Toilet Transfer Details (indicate cue type and reason): total care at [facility]   Toileting - Clothing Manipulation Details (indicate cue type and reason): total care at [facility]             Vision Patient Visual Report: No change from baseline              Pertinent Vitals/Pain Pain Assessment Pain Assessment: No/denies pain     Hand Dominance  Right   Extremity/Trunk Assessment Upper Extremity Assessment Upper Extremity Assessment: RUE deficits/detail;LUE deficits/detail RUE Deficits / Details: using functionally LUE Deficits / Details: Pt has AROM of shoulder extension, elbow flexion/extension, forearm supination/pronation and slight movement  of fingers; wears a splint on and off 3-4 hours at [facility]           Communication Communication Communication:  (dysarthric)   Cognition Arousal/Alertness: Awake/alert Behavior During Therapy: WFL for tasks assessed/performed Overall Cognitive Status: History of cognitive impairments - at baseline                                 General Comments: following all one step commands, asking RNs in room what they were trying to do                Home Living Family/patient expects to be discharged to:: Skilled nursing facility                                        Prior Functioning/Environment Prior Level of Function : Needs assist             Mobility Comments: Working on SPT at rehab, also reports use of stedy ADLs Comments: Pt reports staff assists with all aspects of her self care        OT Problem List: Decreased strength;Decreased range of motion;Impaired balance (sitting and/or standing);Impaired tone;Impaired sensation;Impaired UE functional use      OT Treatment/Interventions: Self-care/ADL training;DME and/or AE instruction;Patient/family education;Balance training;Therapeutic exercise;Therapeutic activities    OT Goals(Current goals can be found in the care plan section) Acute Rehab OT Goals Patient Stated Goal: to continue to work with therapy OT Goal Formulation: With patient Time For Goal Achievement: 10/20/22 Potential to Achieve Goals: Fair  OT Frequency: Min 1X/week       AM-PAC OT "6 Clicks" Daily Activity     Outcome Measure Help from another person eating meals?: A Little Help from another person taking care of personal grooming?: A Lot Help from another person toileting, which includes using toliet, bedpan, or urinal?: Total Help from another person bathing (including washing, rinsing, drying)?: Total Help from another person to put on and taking off regular upper body clothing?: Total Help from another  person to put on and taking off regular lower body clothing?: Total 6 Click Score: 9   End of Session Nurse Communication:  (RN aware of how pt moves in bed)  Activity Tolerance: Patient tolerated treatment well Patient left: in bed;with call bell/phone within reach;with nursing/sitter in room  OT Visit Diagnosis: Other abnormalities of gait and mobility (R26.89);Muscle weakness (generalized) (M62.81);Hemiplegia and hemiparesis Hemiplegia - Right/Left: Left Hemiplegia - dominant/non-dominant: Non-Dominant Hemiplegia - caused by: Cerebral infarction                Time: 1610-9604 OT Time Calculation (min): 22 min Charges:  OT General Charges $OT Visit: 1 Visit OT Evaluation $OT Eval Moderate Complexity: 1 Mod  Cathy L. OT Acute Rehabilitation Services Office [phone]    Evette Georges 10/06/2022, 4:11 PM

## 2022-10-06 NOTE — Progress Notes (Addendum)
PROGRESS NOTE    [name]  QIO:[id number] DOB: 20-Apr-1961 DOA: 10/04/2022 PCP: [place], [name], MD   Brief Narrative:  This 62 yrs old female with PMH significant for type 2 diabetes mellitus, essential hypertension, hyperlipidemia, CVA with residual left sided weakness, presented in the ED with complaints of shortness of breath as well as nonproductive cough for last 3 days. Patient was recently hospitalized, with hospital course reported to include aspiration pneumonia as well as proctitis.  She completed a course of Unasyn for these processes ,She was discharged on mesalamine suppositories, daily.  She has an outpatient follow-up with [facility] where she was found to have tachycardia with heart rate in 140s to 150s.  Patient was sent in the ED for further evaluation. CTA chest ruled out pulmonary embolism but did show evidence of bilateral lower lobe airspace opacities concerning for pneumonia.  CT abdomen and pelvis shows findings consistent with proctitis, no bowel obstruction or abscess or perforation. Patient was admitted for sepsis secondary to community-acquired pneumonia and proctitis started on empiric antibiotics(ceftriaxone, Zithromax and Flagyl).  Assessment & Plan:   Principal Problem:   CAP (community acquired pneumonia) Active Problems:   Essential hypertension   DM2 (diabetes mellitus, type 2) (HCC)   HLD (hyperlipidemia)   Severe sepsis (HCC)   Dehydration   Acute prerenal azotemia   Proctitis   Hypercalcemia   Transaminitis   Pyuria   Depression  Severe sepsis secondary to community-acquired pneumonia: Patient presented with cough, shortness of breath, subjective fever x 3 days,  CTA chest showed bilateral lower lobe opacities consistent with pneumonia. She presented with leukocytosis, tachycardia, tachypnea, hypoxia ,lactic acid 2.9, PNA on CT chest. Lactic acid improved with IV fluid resuscitation. Continue empiric antibiotics  (Ceftriaxone, Zithromax, Flagyl) Follow blood cultures. Continue IV fluid resuscitation. Check strep pneumo and Legionella urinary antigen. UA: showed large leucocytes. Pulmonology consulted for further recommendation.  Dehydration: Patient clinically appeared dehydrated. Continue IV fluid resuscitation.  Proctitis: Patient was recently hospitalized and found to have proctitis. She was discharged on mesalamine suppositories.   CT abdomen showed findings consistent with persistent proctitis. Gastroenterology recommended to increase mesalamine suppository to twice daily. Defer flexible sigmoidoscopy at this time.  Hypercalcemia: > Resolved. She was found to have calcium 10.1 on admission. Continue IV fluid resuscitation. Serum calcium has improved.  Elevated liver enzymes: CT abdomen showed no evidence of gallbladder pathology or any acute hepatic involvement. Hold statin dose. Follow-up liver enzymes.  Type 2 diabetes: Regular insulin sliding scale. Carb modified diet. Hold Trulicity.  Essential hypertension Hold blood pressure medication in the setting of sepsis. Resume when BP improves.  Hyperlipidemia: Hold statin in the setting of elevated liver enzymes.  Resume once LFTs improved.  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  CVA with left-sided weakness: Continue aspirin , resume statin once LFTs improved  DVT prophylaxis: SCDs Code Status:Full code Family Communication: (No family at bed side) Disposition Plan:    Status is: Inpatient Remains inpatient appropriate because: Admitted for recurrent pneumonia with sepsis, requiring IV antibiotics. Pulmonology consulted.    Consultants:  None  Procedures: CT Chest, CT A/P  Antimicrobials:  Anti-infectives (From admission, onward)    Start     Dose/Rate Route Frequency Ordered Stop   10/05/22 1400  cefTRIAXone (ROCEPHIN) 1 g in sodium chloride 0.9 % 100 mL IVPB        1 g 200 mL/hr over 30 Minutes  Intravenous Every 24 hours 10/04/22 2030     10/05/22 0800  metroNIDAZOLE (  FLAGYL) IVPB 500 mg        500 mg 100 mL/hr over 60 Minutes Intravenous Every 12 hours 10/04/22 2030     10/04/22 2045  azithromycin (ZITHROMAX) 500 mg in sodium chloride 0.9 % 250 mL IVPB        500 mg 250 mL/hr over 60 Minutes Intravenous Every 24 hours 10/04/22 2030     10/04/22 2000  cefTRIAXone (ROCEPHIN) 2 g in sodium chloride 0.9 % 100 mL IVPB        2 g 200 mL/hr over 30 Minutes Intravenous  Once 10/04/22 1956 10/04/22 2111   10/04/22 2000  metroNIDAZOLE (FLAGYL) IVPB 500 mg        500 mg 100 mL/hr over 60 Minutes Intravenous  Once 10/04/22 1956 10/04/22 2150      Subjective: Patient seen and examined at bedside.  Overnight events noted.   She reports feeling better, She has left-sided weakness from CVA.   Objective: Vitals:   10/05/22 2300 10/06/22 0340 10/06/22 0500 10/06/22 0740  BP: 128/83 132/84  (!) 160/86  Pulse: 89 95  99  Resp: (!) 22 20  (!) 22  Temp: 98 F (36.7 C) 98.3 F (36.8 C)  98.2 F (36.8 C)  TempSrc: Oral Oral  Oral  SpO2: 93% 93%  95%  Weight:   66 kg   Height:        Intake/Output Summary (Last 24 hours) at 10/06/2022 0959 Last data filed at 10/06/2022 0600 Gross per 24 hour  Intake 220 ml  Output 650 ml  Net -430 ml   Filed Weights   10/05/22 0535 10/06/22 0500  Weight: 65.3 kg 66 kg    Examination:  General exam: Appears comfortable, deconditioned, not in any acute distress. Respiratory system: CTA bilaterally. Respiratory effort normal.  RR 14 Cardiovascular system: S1 & S2 heard, regular rate and rhythm, no murmur Gastrointestinal system: Abdomen is soft, non tender, non distended, BS present Central nervous system: Alert and oriented X 3. Left sided weakness Extremities: No edema, No cyanosis, No clubbing Skin: No rashes, lesions or ulcers Psychiatry: Judgement and insight appear normal. Mood & affect appropriate.     Data Reviewed: I have personally  reviewed following labs and imaging studies  CBC: Recent Labs  Lab 10/04/22 1621 10/05/22 0058 10/06/22 0034  WBC 14.7* 11.0* 7.9  NEUTROABS  --  6.8  --   HGB 12.6 10.0* 9.5*  HCT 39.2 30.8* 28.8*  MCV 79.0* 78.6* 79.8*  PLT 393 301 250   Basic Metabolic Panel: Recent Labs  Lab 10/04/22 1621 10/04/22 1819 10/05/22 0058 10/06/22 0034  NA 136  --  138 138  K 4.2  --  4.3 3.5  CL 102  --  107 107  CO2 21*  --  24 22  GLUCOSE 231*  --  168* 110*  BUN 20  --  15 14  CREATININE 0.98  --  0.63 0.72  CALCIUM 10.1  --  9.3 9.2  MG  --  2.0 2.0 1.9  PHOS  --   --  3.5 3.4   GFR: Estimated Creatinine Clearance: 67.3 mL/min (by C-G formula based on SCr of 0.72 mg/dL). Liver Function Tests: Recent Labs  Lab 10/04/22 1621 10/05/22 0058  AST 94* 62*  ALT 180* 129*  ALKPHOS 152* 111  BILITOT 0.5 0.4  PROT 8.1 6.2*  ALBUMIN 3.2* 2.5*   No results for input(s): "LIPASE", "AMYLASE" in the last 168 hours. No results for input(s): "AMMONIA" in  the last 168 hours. Coagulation Profile: Recent Labs  Lab 10/05/22 0058  INR 1.1   Cardiac Enzymes: No results for input(s): "CKTOTAL", "CKMB", "CKMBINDEX", "TROPONINI" in the last 168 hours. BNP (last 3 results) No results for input(s): "PROBNP" in the last 8760 hours. HbA1C: No results for input(s): "HGBA1C" in the last 72 hours. CBG: Recent Labs  Lab 10/05/22 0624 10/05/22 1104 10/05/22 1635 10/05/22 2129 10/06/22 0624  GLUCAP 138* 137* 119* 128* 100*   Lipid Profile: No results for input(s): "CHOL", "HDL", "LDLCALC", "TRIG", "CHOLHDL", "LDLDIRECT" in the last 72 hours. Thyroid Function Tests: No results for input(s): "TSH", "T4TOTAL", "FREET4", "T3FREE", "THYROIDAB" in the last 72 hours. Anemia Panel: No results for input(s): "VITAMINB12", "FOLATE", "FERRITIN", "TIBC", "IRON", "RETICCTPCT" in the last 72 hours. Sepsis Labs: Recent Labs  Lab 10/04/22 1626 10/04/22 1819 10/05/22 0058  PROCALCITON  --   --  0.75   LATICACIDVEN 2.9* 1.5  --     Recent Results (from the past 240 hour(s))  Culture, blood (routine x 2)     Status: None (Preliminary result)   Collection Time: 10/04/22  4:10 PM   Specimen: BLOOD  Result Value Ref Range Status   Specimen Description BLOOD RIGHT ANTECUBITAL  Final   Special Requests   Final    BOTTLES DRAWN AEROBIC AND ANAEROBIC Blood Culture results may not be optimal due to an excessive volume of blood received in culture bottles   Culture   Final    NO GROWTH 2 DAYS Performed at [facility], [address], [place], [place] [address]    Report Status PENDING  Incomplete  Culture, blood (routine x 2)     Status: None (Preliminary result)   Collection Time: 10/04/22  7:40 PM   Specimen: BLOOD RIGHT HAND  Result Value Ref Range Status   Specimen Description BLOOD RIGHT HAND  Final   Special Requests   Final    BOTTLES DRAWN AEROBIC ONLY Blood Culture adequate volume   Culture   Final    NO GROWTH 2 DAYS Performed at [facility], [address] [place], [place] [address]    Report Status PENDING  Incomplete  MRSA Next Gen by PCR, Nasal     Status: None   Collection Time: 10/05/22  5:34 AM   Specimen: Nasal Mucosa; Nasal Swab  Result Value Ref Range Status   MRSA by PCR Next Gen NOT DETECTED NOT DETECTED Final    Comment: (NOTE) The GeneXpert MRSA Assay (FDA approved for NASAL specimens only), is one component of a comprehensive MRSA colonization surveillance program. It is not intended to diagnose MRSA infection nor to guide or monitor treatment for MRSA infections. Test performance is not FDA approved in patients less than 72 years old. Performed at [facility], [address], [place], [place] [address]     Radiology Studies: CT Angio Chest PE W and/or Wo Contrast  Result Date: 10/04/2022 CLINICAL DATA:  Pulmonary embolism suspected. EXAM: CT ANGIOGRAPHY CHEST WITH CONTRAST TECHNIQUE: Multidetector CT imaging of the chest was  performed using the standard protocol during bolus administration of intravenous contrast. Multiplanar CT image reconstructions and MIPs were obtained to evaluate the vascular anatomy. RADIATION DOSE REDUCTION: This exam was performed according to the departmental dose-optimization program which includes automated exposure control, adjustment of the mA and/or kV according to patient size and/or use of iterative reconstruction technique. CONTRAST:  75mL OMNIPAQUE IOHEXOL 350 MG/ML SOLN COMPARISON:  None Available. FINDINGS: Cardiovascular: Satisfactory  opacification of the pulmonary arteries to the segmental level. No evidence of pulmonary embolism. Normal heart size. No pericardial effusion. Main pulmonary trunk is dilated measuring up to 3.4 cm concerning for pulmonary arterial hypertension. Aberrant origin of the right subclavian artery. Mediastinum/Nodes: Bulky bilateral axillary, prevascular, right hilar, subcarinal lymph nodes, not significantly changed since prior examination, concerning for lymphoma. Lungs/Pleura: Bilateral lower lobe ground-glass and patchy lung opacities with bronchial thickening concerning for bilateral lower lobe pneumonia. No pleural effusion or pneumothorax. Upper Abdomen: No acute abnormality. Musculoskeletal: No chest wall abnormality. No acute or significant osseous findings. Review of the MIP images confirms the above findings. IMPRESSION: 1. No CT evidence of pulmonary embolism. 2. Bilateral lower lobe ground-glass and patchy lung opacities with bronchial thickening concerning for bilateral lower lobe pneumonia. 3. Bulky bilateral axillary, prevascular, right hilar, subcarinal lymph nodes, not significantly changed since prior examination, concerning for lymphoma. 4. Dilated main pulmonary trunk measuring up to 3.4 cm concerning for pulmonary arterial hypertension. 5. Aberrant origin of the right subclavian artery. Electronically Signed   By: Larose Hires D.O.   On: 10/04/2022  19:25   CT ABDOMEN PELVIS W CONTRAST  Result Date: 10/04/2022 CLINICAL DATA:  Bowel obstruction suspected. Emesis. History of CLL. EXAM: CT ABDOMEN AND PELVIS WITH CONTRAST TECHNIQUE: Multidetector CT imaging of the abdomen and pelvis was performed using the standard protocol following bolus administration of intravenous contrast. RADIATION DOSE REDUCTION: This exam was performed according to the departmental dose-optimization program which includes automated exposure control, adjustment of the mA and/or kV according to patient size and/or use of iterative reconstruction technique. CONTRAST:  75mL OMNIPAQUE IOHEXOL 350 MG/ML SOLN COMPARISON:  Abdominal ultrasound 09/24/2022. CT angiogram chest abdomen and pelvis 05/29/2022. FINDINGS: Lower chest: There are minimal patchy airspace and ground-glass opacities in both lower lobes, likely infectious/inflammatory. Hepatobiliary: No focal liver abnormality is seen. No gallstones, gallbladder wall thickening, or biliary dilatation. Pancreas: Unremarkable. No pancreatic ductal dilatation or surrounding inflammatory changes. Spleen: Normal in size without focal abnormality. Adrenals/Urinary Tract: There is indistinctness of the corticomedullary enhancement pattern both kidneys with mild bilateral perinephric fat stranding, right greater than left. There is no hydronephrosis or perinephric fluid collection. Bilateral renal cysts are present measuring up 2 4.3 cm in the inferior pole the left kidney. Appearance is similar to the prior study. The adrenal glands and bladder are within normal limits. Stomach/Bowel: There is inferior rectal wall thickening inflammation, new from prior. There are new small perirectal lymph nodes. There is no bowel obstruction, pneumatosis or free air. Appendix, stomach and small bowel are within normal limits. Vascular/Lymphatic: Aorta and IVC are normal in size. There are atherosclerotic calcifications of the aorta. There is diffuse  retroperitoneal lymphadenopathy with the largest lymph node in the aortocaval region measuring 2.7 x 1.8 cm, similar to the prior study. Retrocaval lymphadenopathy has slightly increased for example image 3/40 measuring 16 mm short axis (previously 13). Right iliac chain lymphadenopathy has increased measuring 3.5 by 4.7 cm (previously 2.9 by 3.5 cm). Left iliac chain lymphadenopathy measures up to 2.1 cm (previously 18 mm). Bilateral pelvic sidewall lymphadenopathy has increased, right greater than left. The largest lymph node is along the right pelvic sidewall measuring 3.0 x 4.7 cm (previously 1.1 x 3.6 cm). There is new bilateral inguinal lymphadenopathy, right greater than left, measuring up to 16 mm short axis. Additionally, there is increasing periportal lymphadenopathy with largest lymph node measuring up to 2 cm (previously 17 mm). Bulky central mesenteric lymphadenopathy is mildly diffusely increased.  Right lower quadrant mesenteric lymphadenopathy has also mildly diffusely increased. Reproductive: Status post hysterectomy. No adnexal masses. Other: There is a small fat containing umbilical hernia. There is no ascites. Musculoskeletal: Bones are diffusely heterogeneous, unchanged. No acute fractures are seen. IMPRESSION: 1. There is new inferior rectal wall thickening and inflammation with new perirectal lymph nodes. Findings are worrisome for proctitis. Neoplasm not excluded. 2. Increased diffuse retroperitoneal, mesenteric, pelvic sidewall, inguinal, and periportal lymphadenopathy. 3. Bilateral perinephric fat stranding with indistinctness of the corticomedullary enhancement pattern of both kidneys worrisome for pyelonephritis. No hydronephrosis or perinephric fluid collection. 4. Minimal patchy airspace and ground-glass opacities in both lower lobes, likely infectious/inflammatory. 5. Stable heterogeneous appearance of the bones, likely related to patient's history of CLL. Aortic Atherosclerosis  (ICD10-I70.0). Electronically Signed   By: Darliss Cheney M.D.   On: 10/04/2022 19:21   DG Chest Port 1 View  Result Date: 10/04/2022 CLINICAL DATA:  Shortness of breath EXAM: PORTABLE CHEST 1 VIEW COMPARISON:  Radiograph and CT 09/23/2022 FINDINGS: Stable cardiomediastinal silhouette. Aortic atherosclerotic calcification. Scarring left mid lung. No focal consolidation, pleural effusion, or pneumothorax. Prominent right hilum similar to prior radiograph and likely related to adenopathy as seen on CT 09/24/2022. No displaced rib fractures. IMPRESSION: 1. No active disease. 2. Prominent right hilum likely related to adenopathy as seen on CT 09/24/2022. Electronically Signed   By: Minerva Fester M.D.   On: 10/04/2022 17:31    Scheduled Meds:  aspirin EC  81 mg Oral Daily   ezetimibe  10 mg Oral Daily   FLUoxetine  20 mg Oral Daily   insulin aspart  0-9 Units Subcutaneous TID WC   mesalamine  1,000 mg Rectal BID   pantoprazole  40 mg Oral Daily   Continuous Infusions:  azithromycin 500 mg (10/05/22 2010)   cefTRIAXone (ROCEPHIN)  IV 1 g (10/05/22 1435)   metronidazole 500 mg (10/06/22 0941)     LOS: 2 days    Time spent: 35 mins.    Willeen Niece, MD [facility]   If 7PM-7AM, please contact night-coverage

## 2022-10-06 NOTE — Consult Note (Signed)
NAME:  [name], MRN:  [id number], DOB:  1960/11/08, LOS: 2 ADMISSION DATE:  10/04/2022, CONSULTATION DATE:  6/6 REFERRING MD:  Pardeep, CHIEF COMPLAINT:  recurrent PNA    History of Present Illness:  This is a 62 year old female patient who was just discharged on 5/28 following a large right-sided stroke with residual left-sided hemiplegia and dysphagia.  Was discharged to home on Augmentin which she completed.  She was being seen as an outpatient follow-up at [facility] gastroenterology and noted to have tachycardia with heart rate in the 140s to 150s so was referred to the emergency room.  On further evaluation she endorsed 3-day history of worsening shortness of breath and nonproductive cough in addition to recurrent nausea and intermittent episodes of nonbilious emesis.  She endorses subjective fever but no chills denied any chest pain syncope or dizziness.  In the emergency room a chest x-ray was obtained for which had some right greater than left atelectatic changes a follow-up CT scan was obtained this showed bibasilar airspace disease she was admitted with a working diagnosis of pneumonia, because this has been recurrent in nature pulmonary was asked to evaluate.  Pertinent  Medical History  Type 2 diabetes, essential hypertension, hyperlipidemia, CVA with residual left-sided weakness, aspiration pneumonia, proctitis Non-Hodgkin's lymphoma Significant Hospital Events: Including procedures, antibiotic start and stop dates in addition to other pertinent events   5/26 last SLP note noting moderate oral dysphagia felt due to dentition and labial seal noted to be at higher risk of aspirations with liquids than solids had additional pharyngeal dysphagia with aspiration noted 5/28 discharge 6/5 admitted with recurrent pneumonia 6/6 pulmonary asked to evaluate for recurrent pneumonia  Interim History / Subjective:  Feels better  Now off oxygen   Objective   Blood pressure (Abnormal)  160/86, pulse 99, temperature 98.2 F (36.8 C), temperature source Oral, resp. rate (Abnormal) 22, height 5\' 4"  (1.626 m), weight 66 kg, SpO2 95 %.        Intake/Output Summary (Last 24 hours) at 10/06/2022 1013 Last data filed at 10/06/2022 0600 Gross per 24 hour  Intake 220 ml  Output 650 ml  Net -430 ml   Filed Weights   10/05/22 0535 10/06/22 0500  Weight: 65.3 kg 66 kg    Examination: General: Chronically ill 62 year old female currently resting in bed she is in no acute distress currently HENT: Mucous membranes moist poor dentition no JVD Lungs: Scattered rhonchi no accessory use currently on room air CT chest reviewed showing bibasilar airspace disease.  Of the note while walking in room patient was drinking water from a straw shortly after noticed more rhonchorous upper airway noises with poor cough Cardiovascular: Regular rate and rhythm Abdomen: Soft nontender Extremities: No edema Neuro: Awake oriented x 3 severely dysarthric left-sided residual weakness with 2 out of 5 strength upper and 1 out of 5 strength lower  Resolved Hospital Problem list     Assessment & Plan:   Recurrent aspiration pneumonia Subacute stroke with left-sided hemiparesis Dysphagia Elevated LFTs Type 2 diabetes Essential hypertension Hyperlipidemia Major depression   Pulm problem list Recurrent aspiration pneumonia Plan Supplemental oxygen Pulmonary hygiene Aspiration precautions, I think we need We need to consider changing fluid consistency, as she had penetration with thin liquids before Would be reasonable to [facility] SLP Day #2 ceftriaxone and azithromycin, could probably narrow to ceftriaxone alone, discharge once again on Augmentin, would complete 7 days of total therapy   We will be available as needed but this is not  a primary pulmonary issue   Best Practice (right click and "Reselect all SmartList Selections" daily)   Per primary Labs   CBC: Recent Labs  Lab  10/04/22 1621 10/05/22 0058 10/06/22 0034  WBC 14.7* 11.0* 7.9  NEUTROABS  --  6.8  --   HGB 12.6 10.0* 9.5*  HCT 39.2 30.8* 28.8*  MCV 79.0* 78.6* 79.8*  PLT 393 301 250    Basic Metabolic Panel: Recent Labs  Lab 10/04/22 1621 10/04/22 1819 10/05/22 0058 10/06/22 0034  NA 136  --  138 138  K 4.2  --  4.3 3.5  CL 102  --  107 107  CO2 21*  --  24 22  GLUCOSE 231*  --  168* 110*  BUN 20  --  15 14  CREATININE 0.98  --  0.63 0.72  CALCIUM 10.1  --  9.3 9.2  MG  --  2.0 2.0 1.9  PHOS  --   --  3.5 3.4   GFR: Estimated Creatinine Clearance: 67.3 mL/min (by C-G formula based on SCr of 0.72 mg/dL). Recent Labs  Lab 10/04/22 1621 10/04/22 1626 10/04/22 1819 10/05/22 0058 10/06/22 0034  PROCALCITON  --   --   --  0.75  --   WBC 14.7*  --   --  11.0* 7.9  LATICACIDVEN  --  2.9* 1.5  --   --     Liver Function Tests: Recent Labs  Lab 10/04/22 1621 10/05/22 0058  AST 94* 62*  ALT 180* 129*  ALKPHOS 152* 111  BILITOT 0.5 0.4  PROT 8.1 6.2*  ALBUMIN 3.2* 2.5*   No results for input(s): "LIPASE", "AMYLASE" in the last 168 hours. No results for input(s): "AMMONIA" in the last 168 hours.  ABG    Component Value Date/Time   HCO3 24.8 09/24/2022 0028   TCO2 26 09/24/2022 0028   O2SAT 96 09/24/2022 0028     Coagulation Profile: Recent Labs  Lab 10/05/22 0058  INR 1.1    Cardiac Enzymes: No results for input(s): "CKTOTAL", "CKMB", "CKMBINDEX", "TROPONINI" in the last 168 hours.  HbA1C: Hemoglobin A1C  Date/Time Value Ref Range Status  09/06/2022 11:33 AM 6.7 (A) 4.0 - 5.6 % Final   HbA1c, POC (controlled diabetic range)  Date/Time Value Ref Range Status  07/13/2020 01:27 PM 11.7 (A) 0.0 - 7.0 % Final   Hgb A1c MFr Bld  Date/Time Value Ref Range Status  05/30/2022 05:41 AM 11.4 (H) 4.8 - 5.6 % Final    Comment:    (NOTE) Pre diabetes:          5.7%-6.4%  Diabetes:              >6.4%  Glycemic control for   <7.0% adults with diabetes    03/16/2022 03:04 PM >14.0 (H) <5.7 % of total Hgb Final    Comment:    Verified by repeat analysis. . For someone without known diabetes, a hemoglobin A1c value of 6.5% or greater indicates that they may have  diabetes and this should be confirmed with a follow-up  test. . For someone with known diabetes, a value <7% indicates  that their diabetes is well controlled and a value  greater than or equal to 7% indicates suboptimal  control. A1c targets should be individualized based on  duration of diabetes, age, comorbid conditions, and  other considerations. . Currently, no consensus exists regarding use of hemoglobin A1c for diagnosis of diabetes for children. .     CBG:  Recent Labs  Lab 10/05/22 0624 10/05/22 1104 10/05/22 1635 10/05/22 2129 10/06/22 0624  GLUCAP 138* 137* 119* 128* 100*    Review of Systems:   Review of Systems  Constitutional:  Positive for fever.  Eyes: Negative.   Respiratory:  Positive for cough.   Cardiovascular:  Negative for chest pain.  Gastrointestinal: Negative.   Genitourinary: Negative.   Musculoskeletal: Negative.   Skin: Negative.      Past Medical History:  She,  has a past medical history of Abnormality of gait (05/10/2010), BACK PAIN (11/14/2008), Class 1 obesity due to excess calories with body mass index (BMI) of 31.0 to 31.9 in adult (02/07/2021), DIABETES MELLITUS, TYPE II (07/15/2008), Diplopia (07/15/2008), ECZEMA, ATOPIC (04/03/2009), GERD (gastroesophageal reflux disease), Guillain-Barre (HCC) (1988), HYPERLIPIDEMIA (03/06/2009), HYPERTENSION (07/15/2008), Stroke (HCC) (2010, 2011), and Vertebral artery stenosis.   Surgical History:   Past Surgical History:  Procedure Laterality Date   ABDOMINAL HYSTERECTOMY     BIOPSY  07/08/2022   Procedure: BIOPSY;  Surgeon: Benancio Deeds, MD;  Location: [facility] ENDOSCOPY;  Service: Gastroenterology;;   COLONOSCOPY WITH PROPOFOL N/A 07/08/2022   Procedure: COLONOSCOPY WITH  PROPOFOL;  Surgeon: Benancio Deeds, MD;  Location: [facility] ENDOSCOPY;  Service: Gastroenterology;  Laterality: N/A;   DILATION AND CURETTAGE OF UTERUS     FOOT SURGERY     IR ANGIO INTRA EXTRACRAN SEL COM CAROTID INNOMINATE UNI L MOD SED  09/17/2021   IR ANGIO INTRA EXTRACRAN SEL INTERNAL CAROTID UNI R MOD SED  09/17/2021   IR ANGIO VERTEBRAL SEL VERTEBRAL UNI R MOD SED  09/17/2021   IR US GUIDE VASC ACCESS RIGHT  09/17/2021     Social History:   reports that she has never smoked. She has never used smokeless tobacco. She reports that she does not drink alcohol and does not use drugs.   Family History:  Her family history includes Asthma in an other family member; Cancer in her father; Diabetes in her mother and sister; Hypertension in an other family member; Stroke in her mother and another family member. There is no history of Breast cancer.   Allergies No Known Allergies   Home Medications  Prior to Admission medications   Medication Sig Start Date End Date Taking? Authorizing Provider  acetaminophen (TYLENOL) 325 MG tablet Take 650 mg by mouth every 4 (four) hours as needed for mild pain. Do not exceed 3000mg  in 24 hours    [provider]  AMBULATORY NON FORMULARY MEDICATION Medication Name: Carafate 50 mg suppositories: Insert 1 suppository rectally twice a day for 2 weeks 08/31/22   Doree Albee, PA-C  AMBULATORY NON FORMULARY MEDICATION Medication Name: Diltiazem 2%/Lidocaine 2%   Using your index finger apply a small amount of medication inside the anal opening and to the external anal area twice daily x 6 weeks. Patient not taking: Reported on 09/24/2022 08/31/22   Doree Albee, PA-C  amLODipine (NORVASC) 10 MG tablet Take 1 tablet by mouth once daily 05/09/22   [place], [name], MD  aspirin EC 81 MG tablet Take 1 tablet (81 mg total) by mouth daily. Swallow whole. 06/18/22   Osvaldo Shipper, MD  atorvastatin (LIPITOR) 80 MG tablet Take 1 tablet (80 mg total) by mouth  daily. 05/09/22   [place], [name], MD  B Complex Vitamins (VITAMIN B COMPLEX) TABS Take 1 tablet by mouth every morning.    [provider]  Blood Pressure Monitoring (BLOOD PRESSURE MONITOR AUTOMAT) DEVI 1 Device by Does not  apply route daily. 08/29/18   [place], [name], MD  cyanocobalamin 1000 MCG tablet Take 1,000 mcg by mouth daily.    [provider]  Dulaglutide (TRULICITY) 0.75 MG/0.5ML SOPN Inject 0.75 mg into the skin once a week. 04/13/22   Shamleffer, Konrad Dolores, MD  ezetimibe (ZETIA) 10 MG tablet Take 1 tablet (10 mg total) by mouth daily. 05/09/22   [place], [name], MD  FLUoxetine (PROZAC) 20 MG capsule Take 1 capsule (20 mg total) by mouth daily. 09/27/22   Rhetta Mura, MD  hydrocortisone (ANUSOL-HC) 25 MG suppository Place 25 mg rectally 2 (two) times daily as needed for hemorrhoids or anal itching.    [provider]  hydrOXYzine (ATARAX) 10 MG tablet Take 1 tablet (10 mg total) by mouth 3 (three) times daily as needed for anxiety. 09/27/22   Rhetta Mura, MD  insulin glargine-yfgn (SEMGLEE) 100 UNIT/ML injection Inject 0.05 mLs (5 Units total) into the skin daily. 07/12/22   Hongalgi, Maximino Greenland, MD  Lidocaine 5 % CREA Apply 1 Application topically every 6 (six) hours as needed (rectal pain).    [provider]  Menthol, Topical Analgesic, (BIOFREEZE) 4 % GEL Apply 1 Application topically in the morning. Neck and shoulders.    [provider]  mesalamine (CANASA) 1000 MG suppository Place 1,000 mg rectally at bedtime.    [provider]  methocarbamol (ROBAXIN) 500 MG tablet Take 500 mg by mouth 2 (two) times daily.    [provider]  omeprazole (PRILOSEC OTC) 20 MG tablet Take 20 mg by mouth daily.    [provider]  ONE TOUCH LANCETS MISC USE TO CHECK BLOOD SUGAR TWICE A DAY AND PRN 07/29/15   Gordy Savers, MD  OXYGEN Inhale 2 L/min into the lungs as needed.    [provider]   polyethylene glycol (MIRALAX / GLYCOLAX) 17 g packet Take 17 g by mouth every other day. 09/27/22   Rhetta Mura, MD  saccharomyces boulardii (FLORASTOR) 250 MG capsule Take 1 capsule (250 mg total) by mouth 2 (two) times daily. 06/17/22   Osvaldo Shipper, MD  ticagrelor (BRILINTA) 90 MG TABS tablet Take 90 mg by mouth 2 (two) times daily.    [provider]  tiZANidine (ZANAFLEX) 2 MG tablet Take 1 tablet (2 mg total) by mouth 2 (two) times daily. 10/04/22   Glendale Chard, DO     Critical care time: NA    Simonne Martinet ACNP-BC [facility] Pulmonary/Critical Care Pager # [phone] OR # [phone] if no answer

## 2022-10-07 DIAGNOSIS — N939 Abnormal uterine and vaginal bleeding, unspecified: Secondary | ICD-10-CM

## 2022-10-07 DIAGNOSIS — J189 Pneumonia, unspecified organism: Secondary | ICD-10-CM | POA: Diagnosis not present

## 2022-10-07 LAB — CBC
HCT: 29.7 % — ABNORMAL LOW (ref 36.0–46.0)
Hemoglobin: 9.8 g/dL — ABNORMAL LOW (ref 12.0–15.0)
MCH: 26.1 pg (ref 26.0–34.0)
MCHC: 33 g/dL (ref 30.0–36.0)
MCV: 79 fL — ABNORMAL LOW (ref 80.0–100.0)
Platelets: 253 10*3/uL (ref 150–400)
RBC: 3.76 MIL/uL — ABNORMAL LOW (ref 3.87–5.11)
RDW: 15.8 % — ABNORMAL HIGH (ref 11.5–15.5)
WBC: 7.4 10*3/uL (ref 4.0–10.5)
nRBC: 0 % (ref 0.0–0.2)

## 2022-10-07 LAB — GLUCOSE, CAPILLARY
Glucose-Capillary: 123 mg/dL — ABNORMAL HIGH (ref 70–99)
Glucose-Capillary: 124 mg/dL — ABNORMAL HIGH (ref 70–99)
Glucose-Capillary: 126 mg/dL — ABNORMAL HIGH (ref 70–99)
Glucose-Capillary: 93 mg/dL (ref 70–99)
Glucose-Capillary: 222 mg/dL — ABNORMAL HIGH (ref 70–99)

## 2022-10-07 LAB — COMPREHENSIVE METABOLIC PANEL
ALT: 69 U/L — ABNORMAL HIGH (ref 0–44)
AST: 28 U/L (ref 15–41)
Albumin: 2.4 g/dL — ABNORMAL LOW (ref 3.5–5.0)
Alkaline Phosphatase: 96 U/L (ref 38–126)
Anion gap: 8 (ref 5–15)
BUN: 8 mg/dL (ref 8–23)
CO2: 23 mmol/L (ref 22–32)
Calcium: 9.2 mg/dL (ref 8.9–10.3)
Chloride: 107 mmol/L (ref 98–111)
Creatinine, Ser: 0.73 mg/dL (ref 0.44–1.00)
GFR, Estimated: >60 mL/min (ref >60)
Glucose, Bld: 154 mg/dL — ABNORMAL HIGH (ref 70–99)
Potassium: 3.4 mmol/L — ABNORMAL LOW (ref 3.5–5.1)
Sodium: 138 mmol/L (ref 135–145)
Total Bilirubin: 0.5 mg/dL (ref 0.3–1.2)
Total Protein: 6.1 g/dL — ABNORMAL LOW (ref 6.5–8.1)

## 2022-10-07 LAB — PHOSPHORUS: Phosphorus: 3.2 mg/dL (ref 2.5–4.6)

## 2022-10-07 LAB — MAGNESIUM: Magnesium: 1.8 mg/dL (ref 1.7–2.4)

## 2022-10-07 LAB — LEGIONELLA PNEUMOPHILA SEROGP 1 UR AG: L. pneumophila Serogp 1 Ur Ag: NEGATIVE

## 2022-10-07 MED ORDER — POTASSIUM CHLORIDE 20 MEQ PO PACK
40.0000 meq | PACK | Freq: Once | ORAL | Status: AC
  Administered 2022-10-07: 40 meq via ORAL
  Filled 2022-10-07: qty 2

## 2022-10-07 MED ORDER — ADULT MULTIVITAMIN W/MINERALS CH
1.0000 | ORAL_TABLET | Freq: Every day | ORAL | Status: DC
  Administered 2022-10-07 – 2022-10-18 (×12): 1 via ORAL
  Filled 2022-10-07 (×12): qty 1

## 2022-10-07 NOTE — Progress Notes (Signed)
PROGRESS NOTE    Angela Johanson  ZOX:[id number] DOB: [birth year] DOA: 10/04/2022  PCP: [place], [name], MD   Brief Narrative:  This 62 yrs old female with PMH significant for type 2 diabetes mellitus, essential hypertension, hyperlipidemia, CVA with residual left sided weakness, presented in the ED with complaints of shortness of breath as well as nonproductive cough for last 3 days. Patient was recently hospitalized, with hospital course reported to include aspiration pneumonia as well as proctitis.  She completed a course of Unasyn for these processes ,She was discharged on mesalamine suppositories, daily.  She has an outpatient follow-up with [place] gastroenterology where she was found to have tachycardia with heart rate in 140s to 150s.  Patient was sent in the ED for further evaluation. CTA chest ruled out pulmonary embolism but did show evidence of bilateral lower lobe airspace opacities concerning for pneumonia.  CT abdomen and pelvis shows findings consistent with proctitis, no bowel obstruction or abscess or perforation. Patient was admitted for sepsis secondary to recurrent Aspiration pneumonia and proctitis started on empiric antibiotics (ceftriaxone, Zithromax and Flagyl).  Assessment & Plan:   Principal Problem:   CAP (community acquired pneumonia) Active Problems:   Essential hypertension   DM2 (diabetes mellitus, type 2) (HCC)   HLD (hyperlipidemia)   Severe sepsis (HCC)   Dehydration   Acute prerenal azotemia   Proctitis   Hypercalcemia   Transaminitis   Pyuria   Depression  Severe sepsis secondary to recurrent aspiration pneumonia: Patient presented with cough, shortness of breath, and subjective fever x 3 days,  CTA chest showed bilateral lower lobe opacities consistent with pneumonia. She presented with leukocytosis, tachycardia, tachypnea, hypoxia ,lactic acid 2.9, PNA on CT chest. Lactic acid improved with IV fluid resuscitation. Continue empiric antibiotics  (Ceftriaxone, Zithromax, Flagyl) Blood cultures no growth so far. Continue IV fluid resuscitation. Urinary strep pneumo negative, and Legionella pending UA: showed large leucocytes. Pulmonology consulted.  Likely aspiration pneumonia secondary to dysphagia. Limit antibiotics to ceftriaxone for 5 to 7 days.  Can be discharged on Augmentin. Speech and swallow recommended pured diet. Sepsis physiology resolved.  Dehydration: Patient clinically appeared dehydrated. Continue IV fluid resuscitation.  Proctitis: Patient was recently hospitalized and found to have proctitis. She was discharged on mesalamine suppositories.   CT abdomen showed findings consistent with persistent proctitis. Gastroenterology recommended to increase mesalamine suppository to twice daily. Defer flexible sigmoidoscopy at this time.  Hypercalcemia: > Resolved. She was found to have calcium 10.1 on admission. Continue IV fluid resuscitation. Serum calcium has improved.  Elevated liver enzymes: CT abdomen showed no evidence of gallbladder pathology or any acute hepatic involvement. Hold statin dose. Liver enzymes improving.  Type 2 diabetes: Regular insulin sliding scale. Carb modified diet. Hold Trulicity.  Essential hypertension Held blood pressure medication in the setting of sepsis. Resume blood pressure medications.  Hyperlipidemia: Hold statin in the setting of elevated liver enzymes.  Resume once LFTs improved.  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  CVA with left-sided weakness: Continue aspirin , resume statin once LFTs improved.  Vaginal bleeding: GYN consulted.  Awaiting recommendation. H/H remains stable.  DVT prophylaxis: SCDs Code Status:Full code Family Communication: (No family at bed side) Disposition Plan:    Status is: Inpatient Remains inpatient appropriate because: Admitted for recurrent pneumonia with sepsis, requiring IV antibiotics. Pulmonology consulted.     Consultants:  None  Procedures: CT Chest, CT A/P  Antimicrobials:  Anti-infectives (From admission, onward)    Start     Dose/Rate Route Frequency  Ordered Stop   10/06/22 2000  azithromycin (ZITHROMAX) tablet 500 mg        500 mg Oral Every 24 hours 10/06/22 1336     10/05/22 1400  cefTRIAXone (ROCEPHIN) 1 g in sodium chloride 0.9 % 100 mL IVPB        1 g 200 mL/hr over 30 Minutes Intravenous Every 24 hours 10/04/22 2030     10/05/22 0800  metroNIDAZOLE (FLAGYL) IVPB 500 mg        500 mg 100 mL/hr over 60 Minutes Intravenous Every 12 hours 10/04/22 2030     10/04/22 2045  azithromycin (ZITHROMAX) 500 mg in sodium chloride 0.9 % 250 mL IVPB  Status:  Discontinued        500 mg 250 mL/hr over 60 Minutes Intravenous Every 24 hours 10/04/22 2030 10/06/22 1336   10/04/22 2000  cefTRIAXone (ROCEPHIN) 2 g in sodium chloride 0.9 % 100 mL IVPB        2 g 200 mL/hr over 30 Minutes Intravenous  Once 10/04/22 1956 10/04/22 2111   10/04/22 2000  metroNIDAZOLE (FLAGYL) IVPB 500 mg        500 mg 100 mL/hr over 60 Minutes Intravenous  Once 10/04/22 1956 10/04/22 2150      Subjective: Patient seen and examined at bedside.  Overnight events noted.   Patient reports feeling better she was sitting comfortably on the chair. She has left-sided weakness from CVA.  She has blood noted in the vagina.   Objective: Vitals:   10/06/22 2000 10/06/22 2332 10/07/22 0318 10/07/22 0724  BP: (!) 149/91 (!) 151/90 (!) 151/88 (!) 151/88  Pulse: 99 97 96 97  Resp: 15 20 17 14   Temp: 97.9 F (36.6 C) 98.2 F (36.8 C) 97.9 F (36.6 C) 98.2 F (36.8 C)  TempSrc: Oral Oral Oral Oral  SpO2: 93% 97% 96% 100%  Weight:   67.3 kg   Height:        Intake/Output Summary (Last 24 hours) at 10/07/2022 1058 Last data filed at 10/07/2022 0726 Gross per 24 hour  Intake 1205.73 ml  Output 600 ml  Net 605.73 ml   Filed Weights   10/05/22 0535 10/06/22 0500 10/07/22 0318  Weight: 65.3 kg 66 kg 67.3 kg     Examination:  General exam: Appears comfortable, deconditioned, not in any acute distress. Respiratory system: CTA bilaterally. Respiratory effort normal.  RR 13 Cardiovascular system: S1 & S2 heard, regular rate and rhythm, no murmur Gastrointestinal system: Abdomen is soft, non tender, non distended, BS present Central nervous system: Alert and oriented X 3. Left sided weakness Extremities: No edema, No cyanosis, No clubbing Skin: No rashes, lesions or ulcers Psychiatry: Judgement and insight appear normal. Mood & affect appropriate.     Data Reviewed: I have personally reviewed following labs and imaging studies  CBC: Recent Labs  Lab 10/04/22 1621 10/05/22 0058 10/06/22 0034 10/07/22 0028  WBC 14.7* 11.0* 7.9 7.4  NEUTROABS  --  6.8  --   --   HGB 12.6 10.0* 9.5* 9.8*  HCT 39.2 30.8* 28.8* 29.7*  MCV 79.0* 78.6* 79.8* 79.0*  PLT 393 301 250 253   Basic Metabolic Panel: Recent Labs  Lab 10/04/22 1621 10/04/22 1819 10/05/22 0058 10/06/22 0034 10/07/22 0028  NA 136  --  138 138 138  K 4.2  --  4.3 3.5 3.4*  CL 102  --  107 107 107  CO2 21*  --  24 22 23   GLUCOSE 231*  --  168* 110* 154*  BUN 20  --  15 14 8   CREATININE 0.98  --  0.63 0.72 0.73  CALCIUM 10.1  --  9.3 9.2 9.2  MG  --  2.0 2.0 1.9 1.8  PHOS  --   --  3.5 3.4 3.2   GFR: Estimated Creatinine Clearance: 67.8 mL/min (by C-G formula based on SCr of 0.73 mg/dL). Liver Function Tests: Recent Labs  Lab 10/04/22 1621 10/05/22 0058 10/07/22 0028  AST 94* 62* 28  ALT 180* 129* 69*  ALKPHOS 152* 111 96  BILITOT 0.5 0.4 0.5  PROT 8.1 6.2* 6.1*  ALBUMIN 3.2* 2.5* 2.4*   No results for input(s): "LIPASE", "AMYLASE" in the last 168 hours. No results for input(s): "AMMONIA" in the last 168 hours. Coagulation Profile: Recent Labs  Lab 10/05/22 0058  INR 1.1   Cardiac Enzymes: No results for input(s): "CKTOTAL", "CKMB", "CKMBINDEX", "TROPONINI" in the last 168 hours. BNP (last 3 results) No  results for input(s): "PROBNP" in the last 8760 hours. HbA1C: No results for input(s): "HGBA1C" in the last 72 hours. CBG: Recent Labs  Lab 10/06/22 1132 10/06/22 1607 10/06/22 2110 10/07/22 0639 10/07/22 0855  GLUCAP 122* 75 132* 123* 124*   Lipid Profile: No results for input(s): "CHOL", "HDL", "LDLCALC", "TRIG", "CHOLHDL", "LDLDIRECT" in the last 72 hours. Thyroid Function Tests: No results for input(s): "TSH", "T4TOTAL", "FREET4", "T3FREE", "THYROIDAB" in the last 72 hours. Anemia Panel: No results for input(s): "VITAMINB12", "FOLATE", "FERRITIN", "TIBC", "IRON", "RETICCTPCT" in the last 72 hours. Sepsis Labs: Recent Labs  Lab 10/04/22 1626 10/04/22 1819 10/05/22 0058  PROCALCITON  --   --  0.75  LATICACIDVEN 2.9* 1.5  --     Recent Results (from the past 240 hour(s))  Culture, blood (routine x 2)     Status: None (Preliminary result)   Collection Time: 10/04/22  4:10 PM   Specimen: BLOOD  Result Value Ref Range Status   Specimen Description BLOOD RIGHT ANTECUBITAL  Final   Special Requests   Final    BOTTLES DRAWN AEROBIC AND ANAEROBIC Blood Culture results may not be optimal due to an excessive volume of blood received in culture bottles   Culture   Final    NO GROWTH 2 DAYS Performed at [facility], [address] [place], [place] [address]    Report Status PENDING  Incomplete  Culture, blood (routine x 2)     Status: None (Preliminary result)   Collection Time: 10/04/22  7:40 PM   Specimen: BLOOD RIGHT HAND  Result Value Ref Range Status   Specimen Description BLOOD RIGHT HAND  Final   Special Requests   Final    BOTTLES DRAWN AEROBIC ONLY Blood Culture adequate volume   Culture   Final    NO GROWTH 2 DAYS Performed at [facility], [address], [place], [place] [address]    Report Status PENDING  Incomplete  MRSA Next Gen by PCR, Nasal     Status: None   Collection Time: 10/05/22  5:34 AM   Specimen: Nasal Mucosa; Nasal Swab  Result  Value Ref Range Status   MRSA by PCR Next Gen NOT DETECTED NOT DETECTED Final    Comment: (NOTE) The GeneXpert MRSA Assay (FDA approved for NASAL specimens only), is one component of a comprehensive MRSA colonization surveillance program. It is not intended to diagnose MRSA infection nor to guide or monitor treatment for MRSA infections. Test performance is not FDA approved in patients  less than 8 years old. Performed at [facility], [address] [place], [place] [address]     Radiology Studies: No results found.  Scheduled Meds:  aspirin EC  81 mg Oral Daily   azithromycin  500 mg Oral Q24H   Chlorhexidine Gluconate Cloth  6 each Topical Daily   ezetimibe  10 mg Oral Daily   FLUoxetine  20 mg Oral Daily   insulin aspart  0-9 Units Subcutaneous TID WC   mesalamine  1,000 mg Rectal BID   pantoprazole  40 mg Oral Daily   Continuous Infusions:  cefTRIAXone (ROCEPHIN)  IV 1 g (10/06/22 1506)   metronidazole 500 mg (10/07/22 0951)     LOS: 3 days    Time spent: 35 mins.    Willeen Niece, MD [facility]   If 7PM-7AM, please contact night-coverage

## 2022-10-07 NOTE — Progress Notes (Signed)
Mobility Specialist Progress Note:    10/07/22 1022  Mobility  Activity Transferred from chair to bed  Level of Assistance +2 (takes two people) (MaxA)  Assistive Device Stedy  Activity Response Tolerated well  Mobility Referral Yes  $Mobility charge 1 Mobility  Mobility Specialist Start Time (ACUTE ONLY) 0932  Mobility Specialist Stop Time (ACUTE ONLY) [id number]  Mobility Specialist Time Calculation (min) (ACUTE ONLY) 15 min   Per RN requested pt needed assistance returning from chair to bed. For STS from chair to stedy pt needed MaxA on her left side and ModA on her right. When returning from stedy to bed pt needed ModA on her left side and MinA on her right. Pt arranged in bed with RN at bedside.  Thompson Grayer Mobility Specialist  Please contact vis Secure Chat or  Rehab Office [phone]

## 2022-10-07 NOTE — Progress Notes (Signed)
Initial Nutrition Assessment  DOCUMENTATION CODES:   Severe malnutrition in context of chronic illness  INTERVENTION:  Encourage adequate PO intake  Mighty Shake TID with meals, each supplement provides 330 kcals and 9 grams of protein Magic cup TID with meals, each supplement provides 290 kcal and 9 grams of protein MVI with minerals daily  NUTRITION DIAGNOSIS:   Severe Malnutrition related to chronic illness (CVA) as evidenced by percent weight loss, moderate muscle depletion, severe muscle depletion.  GOAL:   Patient will meet greater than or equal to 90% of their needs  MONITOR:   PO intake, Supplement acceptance, Labs, Weight trends  REASON FOR ASSESSMENT:   Consult Poor PO  ASSESSMENT:   Pt recently admitted d/t aspiration PNA and proctitis. Presents with c/o SOB due to severe sepsis secondary to aspiration PNA and persistent proctitis. PMH significant for T2DM, essential HTN, HLD, CVA with residual L sided weakness.   6/7 s/p BSE- recommend regular diet, nectar thick liquids  Spoke with pt at bedside. She reports that her appetite is poor. She used to be able to eat everything on her plate but states that now she only wants to eat about half meals. She was on a pureed diet initially at rehab and states that she was getting the same foods everyday which got tiring to consume. She endorses a tightness in her jaw secondary to her stroke but feels that it is worse in the morning. She is able to self feed. She does not like the nectar thick liquids and does not consume them much as they leave a "fuzzy coating" on her tongue.   Meal completions: 6/5: 5% lunch, 10% dinner 6/7: 100% breakfast  Reviewed documented weight history on file. Within the last year, pt noted to have had a weight loss of 22.3% which is clinically significant for time frame.   Medications: SSI 0-9 units TID, protonix, IV abx  Labs: potassium 3.4, HgbA1c 6.7% (05/07), CBG's 75-132 x24  hours  NUTRITION - FOCUSED PHYSICAL EXAM:  Flowsheet Row Most Recent Value  Orbital Region No depletion  Upper Arm Region Mild depletion  Thoracic and Lumbar Region No depletion  Buccal Region No depletion  Temple Region Severe depletion  Clavicle Bone Region Mild depletion  Clavicle and Acromion Bone Region Mild depletion  Scapular Bone Region No depletion  Dorsal Hand Moderate depletion  Patellar Region Severe depletion  Anterior Thigh Region Severe depletion  Posterior Calf Region Moderate depletion  Edema (RD Assessment) None  Hair Reviewed  Eyes Reviewed  Mouth Other (Comment)  [poor dentition]  Skin Reviewed  Nails Reviewed       Diet Order:   Diet Order             Diet regular Room service appropriate? Yes with Assist; Fluid consistency: Nectar Thick  Diet effective now                   EDUCATION NEEDS:   Education needs have been addressed  Skin:  Skin Assessment: Reviewed RN Assessment  Last BM:  6/6 type 6 medium  Height:   Ht Readings from Last 1 Encounters:  10/05/22 5\' 4"  (1.626 m)    Weight:   Wt Readings from Last 1 Encounters:  10/07/22 67.3 kg   BMI:  Body mass index is 25.47 kg/m.  Estimated Nutritional Needs:   Kcal:  1800-2000  Protein:  90-105g  Fluid:  >/=1.8L  Drusilla Kanner, RDN, LDN Clinical Nutrition

## 2022-10-07 NOTE — Progress Notes (Addendum)
CSW spoke with [place] from [facility]. [name] states pt is LTC there. Pt can return to her LTC facility when medically dc. If STR is recommended SNF states pt can complete rehab at [facility].   10:50 AM MD informed CSW that the plan is for pt to dc back to [facility] tomorrow. CSW notified SNF.

## 2022-10-07 NOTE — Consult Note (Signed)
OBSTETRICS AND GYNECOLOGY ATTENDING CONSULT NOTE  Consult Date: 10/07/2022 Reason for Consult: Vaginal bleeding Consulting Provider: Dr. Willeen Niece   Assessment/Plan: Exam limited by patient postioning/lighting, however no evidence of vaginal bleeding on exam today. No palpable vaginal masses. Based on description from RN, suspect this is a small amount of blood from perineum/rectal area that is collecting in the vagina since patient is sitting/partially reclined for most of her day.   - GC/CT/trich, BV, and candida vaginal swabs obtained & pending - No additional interventions needed at this time. Patient to follow up with [facility] as an outpatient if she continues to have abnormal vaginal discharge or other gynecologic concerns - If patient starts having heavy vaginal bleeding (saturating pad within 1 hour or passing clots), please call [phone] [facility] Consult Attending Monday-Friday 8am - 5pm) or [phone] [facility] OB/[facility]N Attending On Call all day, every day)  Will follow up the testing outlined above, but will otherwise sign off at this time. Thank you for involving [place] in the care of this patient.  Total consultation time including face-to-face time with patient (>50% of time), reviewing chart and documentation: 35 minutes  Harvie Bridge, MD Obstetrician & Gynecologist, [facility], [facility] Consult Phone: [phone]   History of Present Illness: [name] is an 62 y.o. menopausal women who is s/p TAH in approx 1990 for fibroids/AUB w/ hx CVA w/ residual L sided weakness, T2DM, HTN who is admitted for PNA.   RN reported vaginal bleeding that started yesterday. She reported a small amount of vaginal discharge mixed with dark blood. Noted a small tear in her genital area without active bleeding. Per consulting MD, no evidence of bleeding on rectal exam & foley catheter placed with clear  urine.  Pt is not sexually active. Denies anything in the vagina in the past few days/weeks. Denies vaginal pain. Has not noticed bleeding.   Pertinent OB/GYN History: No LMP recorded. Patient has had a hysterectomy. OB History  No obstetric history on file.   Patient Active Problem List   Diagnosis Date Noted   Severe sepsis (HCC) 10/05/2022   Dehydration 10/05/2022   Acute prerenal azotemia 10/05/2022   Proctitis 10/05/2022   Hypercalcemia 10/05/2022   Transaminitis 10/05/2022   Pyuria 10/05/2022   Depression 10/05/2022   CAP (community acquired pneumonia) 10/04/2022   Malnutrition of moderate degree 09/26/2022   SOB (shortness of breath) 09/25/2022   Nausea vomiting and diarrhea 09/24/2022   Shortness of breath 09/24/2022   Acute respiratory failure with hypoxia (HCC) 09/24/2022   LFT elevation 09/24/2022   Diabetes mellitus (HCC) 09/06/2022   Rectal ulcer 07/09/2022   Colon ulcer 07/08/2022   Rectal bleeding 07/07/2022   Anemia 07/07/2022   GI bleed 07/06/2022   Dysarthria 06/12/2022   Aspiration pneumonia (HCC) 06/12/2022   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   Anxiety disorder due to medical condition 06/10/2022   AKI (acute kidney injury) (HCC) 06/01/2022   Dizziness 05/29/2022   Personal history of CLL (chronic lymphocytic leukemia) 05/29/2022   Abdominal pain 05/29/2022   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 disease, with long-term current use of insulin (HCC) 04/14/2022   Type 2 diabetes mellitus with diabetic polyneuropathy, with long-term current use of insulin (HCC) 04/14/2022   Antiplatelet or antithrombotic long-term use 03/16/2022   HLD (hyperlipidemia) 10/01/2021   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HCC) 09/17/2021   Stage 3a chronic kidney disease (CKD) (HCC) 09/16/2021  Thyroid nodule 09/16/2021   Cervical lymphadenopathy 09/16/2021   Acute CVA (cerebrovascular accident) (HCC) 09/15/2021   GERD (gastroesophageal reflux disease)  04/30/2021   Peripheral arterial disease (HCC) 04/13/2021   Atherosclerosis of aorta (HCC) 02/14/2021   Cerebral thrombosis with cerebral infarction 02/08/2021   Abnormal MRI of head 02/07/2021   Vertigo 02/07/2021   Class 1 obesity due to excess calories with body mass index (BMI) of 31.0 to 31.9 in adult 02/07/2021   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   Trigger finger, left ring finger 10/04/2018   Alternating constipation and diarrhea 04/20/2018   Diabetic peripheral neuropathy associated with type 2 diabetes mellitus (HCC) 02/26/2018   DM2 (diabetes mellitus, type 2) (HCC) 07/29/2015   ABNORMALITY OF GAIT 05/10/2010   ECZEMA, ATOPIC 04/03/2009   Hyperlipidemia associated with type 2 diabetes mellitus (HCC) 03/06/2009   BACK PAIN 11/14/2008   Hemiparesis affecting left side as late effect of stroke (HCC) 08/05/2008   DIPLOPIA 07/15/2008   Essential hypertension 07/15/2008    Past Medical History:  Diagnosis Date   Abnormality of gait 05/10/2010   BACK PAIN 11/14/2008   Class 1 obesity due to excess calories with body mass index (BMI) of 31.0 to 31.9 in adult 02/07/2021   DIABETES MELLITUS, TYPE II 07/15/2008   Diplopia 07/15/2008   ECZEMA, ATOPIC 04/03/2009   GERD (gastroesophageal reflux disease)    Guillain-Barre (HCC) 1988   HYPERLIPIDEMIA 03/06/2009   HYPERTENSION 07/15/2008   Stroke (HCC) 2010, 2011   x2    Vertebral artery stenosis     Past Surgical History:  Procedure Laterality Date   ABDOMINAL HYSTERECTOMY     BIOPSY  07/08/2022   Procedure: BIOPSY;  Surgeon: Benancio Deeds, MD;  Location: [facility] ENDOSCOPY;  Service: Gastroenterology;;   COLONOSCOPY WITH PROPOFOL N/A 07/08/2022   Procedure: COLONOSCOPY WITH PROPOFOL;  Surgeon: Benancio Deeds, MD;  Location: [facility] ENDOSCOPY;  Service: Gastroenterology;  Laterality: N/A;   DILATION AND CURETTAGE OF UTERUS     FOOT SURGERY     IR ANGIO INTRA EXTRACRAN SEL COM CAROTID INNOMINATE UNI L MOD SED   09/17/2021   IR ANGIO INTRA EXTRACRAN SEL INTERNAL CAROTID UNI R MOD SED  09/17/2021   IR ANGIO VERTEBRAL SEL VERTEBRAL UNI R MOD SED  09/17/2021   IR US GUIDE VASC ACCESS RIGHT  09/17/2021    Family History  Problem Relation Age of Onset   Diabetes Sister    Asthma Other    Stroke Other    Hypertension Other    Diabetes Mother    Stroke Mother    Cancer Father        pt states hae had some kind of stomach cancer, ? stomach or colon   Breast cancer Neg Hx     Social History:  reports that she has never smoked. She has never used smokeless tobacco. She reports that she does not drink alcohol and does not use drugs.  Allergies: No Known Allergies  Medications: I have reviewed the patient's current medications.  Review of Systems: Pertinent items are noted in HPI.  Focused Physical Examination: BP (!) 153/83 (BP Location: Right Arm)   Pulse 93   Temp 97.9 F (36.6 C) (Axillary)   Resp 18   Ht 5\' 4"  (1.626 m)   Wt 67.3 kg   SpO2 95%   BMI 25.47 kg/m  CONSTITUTIONAL: No acute distress, A&Ox3 CARDIOVASCULAR: Normal heart rate noted RESPIRATORY: Effort normal ABDOMEN: Soft, no distention noted.  No tenderness, rebound or  guarding.  PELVIC: Performed in presence of chaperone. Limited by patient positioning & lighting. NEFG. No blood noted on pad. BME with no mass, cuff intact, vagina palpably normal. No blood on glove after exam.  Labs and Imaging: Results for orders placed or performed during the hospital encounter of 10/04/22 (from the past 72 hour(s))  Culture, blood (routine x 2)     Status: None (Preliminary result)   Collection Time: 10/04/22  7:40 PM   Specimen: BLOOD RIGHT HAND  Result Value Ref Range   Specimen Description BLOOD RIGHT HAND    Special Requests      BOTTLES DRAWN AEROBIC ONLY Blood Culture adequate volume   Culture      NO GROWTH 3 DAYS Performed at [facility], [address], [place], [place] [address]    Report Status PENDING    Urinalysis, w/ Reflex to Culture (Infection Suspected) -Urine, Unspecified Source     Status: Abnormal   Collection Time: 10/04/22 10:24 PM  Result Value Ref Range   Specimen Source URINE, UNSPE    Color, Urine YELLOW YELLOW   APPearance CLOUDY (A) CLEAR   Specific Gravity, Urine 1.023 1.005 - 1.030   pH 7.0 5.0 - 8.0   Glucose, UA 50 (A) NEGATIVE mg/dL   Hgb urine dipstick MODERATE (A) NEGATIVE   Bilirubin Urine NEGATIVE NEGATIVE   Ketones, ur NEGATIVE NEGATIVE mg/dL   Protein, ur 604 (A) NEGATIVE mg/dL   Nitrite NEGATIVE NEGATIVE   Leukocytes,Ua LARGE (A) NEGATIVE   RBC / HPF >50 0 - 5 RBC/hpf   WBC, UA >50 0 - 5 WBC/hpf    Comment:        Reflex urine culture not performed if WBC <=10, OR if Squamous epithelial cells >5. If Squamous epithelial cells >5 suggest recollection.    Bacteria, UA NONE SEEN NONE SEEN   Squamous Epithelial / HPF 6-10 0 - 5 /HPF   WBC Clumps PRESENT    Mucus PRESENT    Non Squamous Epithelial 0-5 (A) NONE SEEN    Comment: Performed at [facility], [address], [place], [place] [address]  Legionella Pneumophila Serogp 1 Ur Ag     Status: None   Collection Time: 10/04/22 10:24 PM  Result Value Ref Range   L. pneumophila Serogp 1 Ur Ag Negative Negative    Comment: (NOTE) Presumptive negative for L. pneumophila serogroup 1 antigen in urine, suggesting no recent or current infection. Legionnaires' disease cannot be ruled out since other serogroups and species may also cause disease. Performed At: [facility] [address] [place], [place] [id number] Jolene Schimke MD FA:[id number]    Source of Sample URINE, RANDOM     Comment: Performed at [facility], [address], [place], [place] [address]  Strep pneumoniae urinary antigen     Status: None   Collection Time: 10/04/22 10:24 PM  Result Value Ref Range   Strep Pneumo Urinary Antigen NEGATIVE NEGATIVE    Comment:        Infection due to S. pneumoniae cannot be  absolutely ruled out since the antigen present may be below the detection limit of the test. Performed at [facility], [address] [place], [place] [address]   CBC with Differential/Platelet     Status: Abnormal   Collection Time: 10/05/22 12:58 AM  Result Value Ref Range   WBC 11.0 (H) 4.0 - 10.5 K/uL   RBC 3.92 3.87 - 5.11 MIL/uL  Hemoglobin 10.0 (L) 12.0 - 15.0 g/dL   HCT 16.1 (L) 09.6 - 04.5 %   MCV 78.6 (L) 80.0 - 100.0 fL   MCH 25.5 (L) 26.0 - 34.0 pg   MCHC 32.5 30.0 - 36.0 g/dL   RDW 40.9 (H) 81.1 - 91.4 %   Platelets 301 150 - 400 K/uL   nRBC 0.0 0.0 - 0.2 %   Neutrophils Relative % 62 %   Neutro Abs 6.8 1.7 - 7.7 K/uL   Lymphocytes Relative 26 %   Lymphs Abs 2.9 0.7 - 4.0 K/uL   Monocytes Relative 12 %   Monocytes Absolute 1.3 (H) 0.1 - 1.0 K/uL   Eosinophils Relative 0 %   Eosinophils Absolute 0.0 0.0 - 0.5 K/uL   Basophils Relative 0 %   Basophils Absolute 0.0 0.0 - 0.1 K/uL   Immature Granulocytes 0 %   Abs Immature Granulocytes 0.04 0.00 - 0.07 K/uL    Comment: Performed at [facility], [address], [place], [place] [address]  Comprehensive metabolic panel     Status: Abnormal   Collection Time: 10/05/22 12:58 AM  Result Value Ref Range   Sodium 138 135 - 145 mmol/L   Potassium 4.3 3.5 - 5.1 mmol/L   Chloride 107 98 - 111 mmol/L   CO2 24 22 - 32 mmol/L   Glucose, Bld 168 (H) 70 - 99 mg/dL    Comment: Glucose reference range applies only to samples taken after fasting for at least 8 hours.   BUN 15 8 - 23 mg/dL   Creatinine, Ser 6.21 0.44 - 1.00 mg/dL   Calcium 9.3 8.9 - 30.8 mg/dL   Total Protein 6.2 (L) 6.5 - 8.1 g/dL   Albumin 2.5 (L) 3.5 - 5.0 g/dL   AST 62 (H) 15 - 41 U/L   ALT 129 (H) 0 - 44 U/L   Alkaline Phosphatase 111 38 - 126 U/L   Total Bilirubin 0.4 0.3 - 1.2 mg/dL   GFR, Estimated >65 >78 mL/min    Comment: (NOTE) Calculated using the CKD-EPI Creatinine Equation (2021)    Anion gap 7 5 - 15    Comment: Performed  at [facility], [address], [place], [place] [address]  Magnesium     Status: None   Collection Time: 10/05/22 12:58 AM  Result Value Ref Range   Magnesium 2.0 1.7 - 2.4 mg/dL    Comment: Performed at [facility], [address], [place], [place] [address]  Procalcitonin     Status: None   Collection Time: 10/05/22 12:58 AM  Result Value Ref Range   Procalcitonin 0.75 ng/mL    Comment:        Interpretation: PCT > 0.5 ng/mL and <= 2 ng/mL: Systemic infection (sepsis) is possible, but other conditions are known to elevate PCT as well. (NOTE)       Sepsis PCT Algorithm           Lower Respiratory Tract                                      Infection PCT Algorithm    ----------------------------     ----------------------------         PCT < 0.25 ng/mL                PCT < 0.10 ng/mL  Strongly encourage             Strongly discourage   discontinuation of antibiotics    initiation of antibiotics    ----------------------------     -----------------------------       PCT 0.25 - 0.50 ng/mL            PCT 0.10 - 0.25 ng/mL               OR       >80% decrease in PCT            Discourage initiation of                                            antibiotics      Encourage discontinuation           of antibiotics    ----------------------------     -----------------------------         PCT >= 0.50 ng/mL              PCT 0.26 - 0.50 ng/mL                AND       <80% decrease in PCT             Encourage initiation of                                             antibiotics       Encourage continuation           of antibiotics    ----------------------------     -----------------------------        PCT >= 0.50 ng/mL                  PCT > 0.50 ng/mL               AND         increase in PCT                  Strongly encourage                                      initiation of antibiotics    Strongly encourage escalation           of antibiotics                                      -----------------------------                                           PCT <= 0.25 ng/mL                                                 OR                                        >  80% decrease in PCT                                      Discontinue / Do not initiate                                             antibiotics  Performed at [facility] Lab, [address], [place], [place] [address]   Phosphorus     Status: None   Collection Time: 10/05/22 12:58 AM  Result Value Ref Range   Phosphorus 3.5 2.5 - 4.6 mg/dL    Comment: Performed at [facility], [address], [place], [place] [address]  Protime-INR     Status: None   Collection Time: 10/05/22 12:58 AM  Result Value Ref Range   Prothrombin Time 13.9 11.4 - 15.2 seconds   INR 1.1 0.8 - 1.2    Comment: (NOTE) INR goal varies based on device and disease states. Performed at [facility], [address], [place], [place] [address]   MRSA Next Gen by PCR, Nasal     Status: None   Collection Time: 10/05/22  5:34 AM   Specimen: Nasal Mucosa; Nasal Swab  Result Value Ref Range   MRSA by PCR Next Gen NOT DETECTED NOT DETECTED    Comment: (NOTE) The GeneXpert MRSA Assay (FDA approved for NASAL specimens only), is one component of a comprehensive MRSA colonization surveillance program. It is not intended to diagnose MRSA infection nor to guide or monitor treatment for MRSA infections. Test performance is not FDA approved in patients less than 13 years old. Performed at [facility], [address], [place], [place] [address]   Glucose, capillary     Status: Abnormal   Collection Time: 10/05/22  6:24 AM  Result Value Ref Range   Glucose-Capillary 138 (H) 70 - 99 mg/dL    Comment: Glucose reference range applies only to samples taken after fasting for at least 8 hours.   Comment 1 Notify RN    Comment 2 Document in Chart   Glucose, capillary     Status: Abnormal   Collection  Time: 10/05/22 11:04 AM  Result Value Ref Range   Glucose-Capillary 137 (H) 70 - 99 mg/dL    Comment: Glucose reference range applies only to samples taken after fasting for at least 8 hours.  Glucose, capillary     Status: Abnormal   Collection Time: 10/05/22  4:35 PM  Result Value Ref Range   Glucose-Capillary 119 (H) 70 - 99 mg/dL    Comment: Glucose reference range applies only to samples taken after fasting for at least 8 hours.  Glucose, capillary     Status: Abnormal   Collection Time: 10/05/22  9:29 PM  Result Value Ref Range   Glucose-Capillary 128 (H) 70 - 99 mg/dL    Comment: Glucose reference range applies only to samples taken after fasting for at least 8 hours.   Comment 1 Notify RN    Comment 2 Document in Chart   CBC     Status: Abnormal   Collection Time: 10/06/22 12:34 AM  Result Value Ref Range   WBC 7.9 4.0 - 10.5 K/uL   RBC 3.61 (L) 3.87 - 5.11 MIL/uL   Hemoglobin 9.5 (L)  12.0 - 15.0 g/dL   HCT 19.1 (L) 47.8 - 29.5 %   MCV 79.8 (L) 80.0 - 100.0 fL   MCH 26.3 26.0 - 34.0 pg   MCHC 33.0 30.0 - 36.0 g/dL   RDW 62.1 (H) 30.8 - 65.7 %   Platelets 250 150 - 400 K/uL   nRBC 0.0 0.0 - 0.2 %    Comment: Performed at [facility], [address] [place], [place] [address]  Magnesium     Status: None   Collection Time: 10/06/22 12:34 AM  Result Value Ref Range   Magnesium 1.9 1.7 - 2.4 mg/dL    Comment: Performed at [facility], [address], [place], [place] [address]  Basic metabolic panel     Status: Abnormal   Collection Time: 10/06/22 12:34 AM  Result Value Ref Range   Sodium 138 135 - 145 mmol/L   Potassium 3.5 3.5 - 5.1 mmol/L   Chloride 107 98 - 111 mmol/L   CO2 22 22 - 32 mmol/L   Glucose, Bld 110 (H) 70 - 99 mg/dL    Comment: Glucose reference range applies only to samples taken after fasting for at least 8 hours.   BUN 14 8 - 23 mg/dL   Creatinine, Ser 4.13 0.44 - 1.00 mg/dL   Calcium 9.2 8.9 - 24.4 mg/dL   GFR, Estimated >01 >02  mL/min    Comment: (NOTE) Calculated using the CKD-EPI Creatinine Equation (2021)    Anion gap 9 5 - 15    Comment: Performed at [facility], [address], [place], [place] [address]  Phosphorus     Status: None   Collection Time: 10/06/22 12:34 AM  Result Value Ref Range   Phosphorus 3.4 2.5 - 4.6 mg/dL    Comment: Performed at [facility], [address], [place], [place] [address]  Glucose, capillary     Status: Abnormal   Collection Time: 10/06/22  6:24 AM  Result Value Ref Range   Glucose-Capillary 100 (H) 70 - 99 mg/dL    Comment: Glucose reference range applies only to samples taken after fasting for at least 8 hours.   Comment 1 Notify RN    Comment 2 Document in Chart   Glucose, capillary     Status: Abnormal   Collection Time: 10/06/22 11:32 AM  Result Value Ref Range   Glucose-Capillary 122 (H) 70 - 99 mg/dL    Comment: Glucose reference range applies only to samples taken after fasting for at least 8 hours.  Glucose, capillary     Status: None   Collection Time: 10/06/22  4:07 PM  Result Value Ref Range   Glucose-Capillary 75 70 - 99 mg/dL    Comment: Glucose reference range applies only to samples taken after fasting for at least 8 hours.  Glucose, capillary     Status: Abnormal   Collection Time: 10/06/22  9:10 PM  Result Value Ref Range   Glucose-Capillary 132 (H) 70 - 99 mg/dL    Comment: Glucose reference range applies only to samples taken after fasting for at least 8 hours.   Comment 1 Document in Chart   CBC     Status: Abnormal   Collection Time: 10/07/22 12:28 AM  Result Value Ref Range   WBC 7.4 4.0 - 10.5 K/uL   RBC 3.76 (L) 3.87 - 5.11 MIL/uL   Hemoglobin 9.8 (L) 12.0 - 15.0 g/dL   HCT 47.4 (L) 25.9 - 56.3 %  MCV 79.0 (L) 80.0 - 100.0 fL   MCH 26.1 26.0 - 34.0 pg   MCHC 33.0 30.0 - 36.0 g/dL   RDW 16.1 (H) 09.6 - 04.5 %   Platelets 253 150 - 400 K/uL   nRBC 0.0 0.0 - 0.2 %    Comment: Performed at [facility], [address] [place], [place] [address]  Comprehensive metabolic panel     Status: Abnormal   Collection Time: 10/07/22 12:28 AM  Result Value Ref Range   Sodium 138 135 - 145 mmol/L   Potassium 3.4 (L) 3.5 - 5.1 mmol/L   Chloride 107 98 - 111 mmol/L   CO2 23 22 - 32 mmol/L   Glucose, Bld 154 (H) 70 - 99 mg/dL    Comment: Glucose reference range applies only to samples taken after fasting for at least 8 hours.   BUN 8 8 - 23 mg/dL   Creatinine, Ser 1.91 0.44 - 1.00 mg/dL   Calcium 9.2 8.9 - 47.8 mg/dL   Total Protein 6.1 (L) 6.5 - 8.1 g/dL   Albumin 2.4 (L) 3.5 - 5.0 g/dL   AST 28 15 - 41 U/L   ALT 69 (H) 0 - 44 U/L   Alkaline Phosphatase 96 38 - 126 U/L   Total Bilirubin 0.5 0.3 - 1.2 mg/dL   GFR, Estimated >29 >56 mL/min    Comment: (NOTE) Calculated using the CKD-EPI Creatinine Equation (2021)    Anion gap 8 5 - 15    Comment: Performed at [facility], [address], [place], [place] [address]  Magnesium     Status: None   Collection Time: 10/07/22 12:28 AM  Result Value Ref Range   Magnesium 1.8 1.7 - 2.4 mg/dL    Comment: Performed at [facility], [address] [place], [place] [address]  Phosphorus     Status: None   Collection Time: 10/07/22 12:28 AM  Result Value Ref Range   Phosphorus 3.2 2.5 - 4.6 mg/dL    Comment: Performed at [facility], [address], [place], [place] [address]  Glucose, capillary     Status: Abnormal   Collection Time: 10/07/22  6:39 AM  Result Value Ref Range   Glucose-Capillary 123 (H) 70 - 99 mg/dL    Comment: Glucose reference range applies only to samples taken after fasting for at least 8 hours.   Comment 1 Document in Chart   Glucose, capillary     Status: Abnormal   Collection Time: 10/07/22  8:55 AM  Result Value Ref Range   Glucose-Capillary 124 (H) 70 - 99 mg/dL    Comment: Glucose reference range applies only to samples taken after fasting for at least 8 hours.  Glucose, capillary     Status: Abnormal    Collection Time: 10/07/22 11:08 AM  Result Value Ref Range   Glucose-Capillary 126 (H) 70 - 99 mg/dL    Comment: Glucose reference range applies only to samples taken after fasting for at least 8 hours.  Glucose, capillary     Status: None   Collection Time: 10/07/22  3:42 PM  Result Value Ref Range   Glucose-Capillary 93 70 - 99 mg/dL    Comment: Glucose reference range applies only to samples taken after fasting for at least 8 hours.    No results found.

## 2022-10-07 NOTE — Evaluation (Signed)
Physical Therapy Evaluation Patient Details Name: [name] MRN: [id number] DOB: 08/03/1960 Today's Date: 10/07/2022  History of Present Illness  Pt is a 62 y.o. female who was admitted on 6/4 with severe sepsis due to community-acquired pneumonia after presenting with tachycardia which was noted during outpatient GI visit. CT chest 6/4: Bilateral lower lobe ground-glass and patchy lung opacities with bronchial thickening concerning for bilateral lower lobe pneumonia. PMH: type 2 diabetes mellitus, essential hypertension, hyperlipidemia, multiple strokes, GS 1988 with full recovery, NHL in remission  Clinical Impression  PTA LTC at [facility], working with PT on SPT and OT on grooming and feeding. Pt total A for mobility and ADL, iADLs. Pt is currently limited in safe mobility by long standing L hemiparesis, and decreased cognition, in presence of increased generalized weakness. Pt is currently maxAx2 for bed mobility and transfers to chair with Stedy. Pt would benefit from return to [facility] when medically ready to resume PT/OT. PT will continue to follow acutely.       Recommendations for follow up therapy are one component of a multi-disciplinary discharge planning process, led by the attending physician.  Recommendations may be updated based on patient status, additional functional criteria and insurance authorization.  Follow Up Recommendations Can patient physically be transported by private vehicle: No     Assistance Recommended at Discharge Frequent or constant Supervision/Assistance  Patient can return home with the following  Two people to help with walking and/or transfers;Two people to help with bathing/dressing/bathroom    Equipment Recommendations None recommended by PT     Functional Status Assessment Patient has had a recent decline in their functional status and demonstrates the ability to make significant improvements in function in a reasonable and predictable  amount of time.     Precautions / Restrictions Precautions Precautions: Fall Precaution Comments: bowel/bladder incontinence, 2L O2 spo2 93-96% Restrictions Weight Bearing Restrictions: No      Mobility  Bed Mobility Overal bed mobility: Needs Assistance     Sidelying to sit: +2 for physical assistance, +2 for safety/equipment, HOB elevated, Max assist       General bed mobility comments: maxAx2, able to move R side, including reaching for railing to assist in pulling to sidelying, needs max pad assist to bring LE off bed and for bringing trunk to upright    Transfers Overall transfer level: Needs assistance Equipment used:  Antony Salmon) Transfers: Sit to/from Stand, Bed to chair/wheelchair/BSC Sit to Stand: Mod assist, +2 safety/equipment, +2 physical assistance           General transfer comment: minAx2 with good power up with maximal cuing for but modAx2 for steadying in standing secondary to L-sided weakness.    Ambulation/Gait               General Gait Details: not able to at baseline         Balance Overall balance assessment: Needs assistance Sitting-balance support: Feet supported, Single extremity supported Sitting balance-Leahy Scale: Poor (requires outside support to maintain EoB sitting)     Standing balance support: Bilateral upper extremity supported, During functional activity Standing balance-Leahy Scale: Zero Standing balance comment: max +2                             Pertinent Vitals/Pain Pain Assessment Pain Assessment: No/denies pain    Home Living Family/patient expects to be discharged to:: Skilled nursing facility  Additional Comments: From [employer] [facility], Pt reports she was working with PT/OT on standing and transferring at [facility]    Prior Function Prior Level of Function : Needs assist             Mobility Comments: Working on SPT at rehab, also reports use of stedy ADLs Comments: Pt  reports staff assists with all aspects of her self care     Hand Dominance   Dominant Hand: Right    Extremity/Trunk Assessment        Lower Extremity Assessment Lower Extremity Assessment: Generalized weakness LLE Deficits / Details: 1/5 knee flex/ext, history of cva    Cervical / Trunk Assessment Cervical / Trunk Assessment: Kyphotic  Communication   Communication: Other (comment) (dysarthric)  Cognition Arousal/Alertness: Awake/alert Behavior During Therapy: WFL for tasks assessed/performed Overall Cognitive Status: History of cognitive impairments - at baseline Area of Impairment: Attention, Following commands, Memory, Safety/judgement, Awareness, Problem solving                   Current Attention Level: Sustained Memory: Decreased short-term memory, Decreased recall of precautions Following Commands: Follows one step commands with increased time Safety/Judgement: Decreased awareness of safety, Decreased awareness of deficits Awareness: Intellectual Problem Solving: Slow processing, Decreased initiation, Requires verbal cues, Difficulty sequencing General Comments: follows 1 step commands consistently, needs extra time        General Comments General comments (skin integrity, edema, etc.): VSS on RA        Assessment/Plan    PT Assessment Patient needs continued PT services  PT Problem List Decreased strength;Decreased range of motion;Decreased activity tolerance;Decreased balance;Decreased mobility;Decreased coordination;Decreased cognition       PT Treatment Interventions DME instruction;Functional mobility training;Therapeutic activities;Therapeutic exercise;Balance training;Neuromuscular re-education;Cognitive remediation;Patient/family education    PT Goals (Current goals can be found in the Care Plan section)  Acute Rehab PT Goals Patient Stated Goal: get back to [facility] PT Goal Formulation: With patient Time For Goal Achievement:  10/10/22 Potential to Achieve Goals: Good    Frequency Min 2X/week        AM-PAC PT "6 Clicks" Mobility  Outcome Measure Help needed turning from your back to your side while in a flat bed without using bedrails?: A Lot Help needed moving from lying on your back to sitting on the side of a flat bed without using bedrails?: Total Help needed moving to and from a bed to a chair (including a wheelchair)?: Total Help needed standing up from a chair using your arms (e.g., wheelchair or bedside chair)?: Total Help needed to walk in hospital room?: Total Help needed climbing 3-5 steps with a railing? : Total 6 Click Score: 7    End of Session Equipment Utilized During Treatment: Gait belt Activity Tolerance: Patient tolerated treatment well;Patient limited by fatigue Patient left: in chair;with call bell/phone within reach;with chair alarm set Nurse Communication: Mobility status;Need for lift equipment (stedy for back to bed) PT Visit Diagnosis: Other abnormalities of gait and mobility (R26.89);Muscle weakness (generalized) (M62.81);Hemiplegia and hemiparesis Hemiplegia - Right/Left: Left Hemiplegia - dominant/non-dominant: Non-dominant Hemiplegia - caused by: Cerebral infarction    Time: 8657-8469 PT Time Calculation (min) (ACUTE ONLY): 24 min   Charges:   PT Evaluation $PT Eval Moderate Complexity: 1 Mod PT Treatments $Therapeutic Activity: 8-22 mins        Sherrilynn Gudgel B. Beverely Risen PT, DPT Acute Rehabilitation Services Please use secure chat or  Call Office [phone]   Elon Alas [facility] 10/07/2022, 9:23 AM

## 2022-10-08 DIAGNOSIS — E43 Unspecified severe protein-calorie malnutrition: Secondary | ICD-10-CM | POA: Insufficient documentation

## 2022-10-08 DIAGNOSIS — J189 Pneumonia, unspecified organism: Secondary | ICD-10-CM | POA: Diagnosis not present

## 2022-10-08 LAB — GLUCOSE, CAPILLARY
Glucose-Capillary: 138 mg/dL — ABNORMAL HIGH (ref 70–99)
Glucose-Capillary: 141 mg/dL — ABNORMAL HIGH (ref 70–99)
Glucose-Capillary: 287 mg/dL — ABNORMAL HIGH (ref 70–99)
Glucose-Capillary: 118 mg/dL — ABNORMAL HIGH (ref 70–99)
Glucose-Capillary: 113 mg/dL — ABNORMAL HIGH (ref 70–99)

## 2022-10-08 LAB — CBC
HCT: 28.7 % — ABNORMAL LOW (ref 36.0–46.0)
Hemoglobin: 9.3 g/dL — ABNORMAL LOW (ref 12.0–15.0)
MCH: 25 pg — ABNORMAL LOW (ref 26.0–34.0)
MCHC: 32.4 g/dL (ref 30.0–36.0)
MCV: 77.2 fL — ABNORMAL LOW (ref 80.0–100.0)
Platelets: 251 10*3/uL (ref 150–400)
RBC: 3.72 MIL/uL — ABNORMAL LOW (ref 3.87–5.11)
RDW: 15.8 % — ABNORMAL HIGH (ref 11.5–15.5)
WBC: 5 10*3/uL (ref 4.0–10.5)
nRBC: 0 % (ref 0.0–0.2)

## 2022-10-08 LAB — CULTURE, BLOOD (ROUTINE X 2)

## 2022-10-08 MED ORDER — ATORVASTATIN CALCIUM 80 MG PO TABS
80.0000 mg | ORAL_TABLET | Freq: Every day | ORAL | Status: DC
  Administered 2022-10-08 – 2022-10-18 (×11): 80 mg via ORAL
  Filled 2022-10-08 (×11): qty 1

## 2022-10-08 MED ORDER — TIZANIDINE HCL 4 MG PO TABS
2.0000 mg | ORAL_TABLET | Freq: Two times a day (BID) | ORAL | Status: DC
  Administered 2022-10-08 – 2022-10-18 (×21): 2 mg via ORAL
  Filled 2022-10-08 (×21): qty 1

## 2022-10-08 MED ORDER — POLYETHYLENE GLYCOL 3350 17 G PO PACK
17.0000 g | PACK | ORAL | Status: DC
  Administered 2022-10-08: 17 g via ORAL
  Filled 2022-10-08 (×2): qty 1

## 2022-10-08 MED ORDER — AMLODIPINE BESYLATE 10 MG PO TABS
10.0000 mg | ORAL_TABLET | Freq: Every day | ORAL | Status: DC
  Administered 2022-10-08 – 2022-10-18 (×11): 10 mg via ORAL
  Filled 2022-10-08 (×11): qty 1

## 2022-10-08 NOTE — Progress Notes (Signed)
PROGRESS NOTE    [name]  ZOX:[id number] DOB: [birth year] DOA: 10/04/2022  PCP: [place], [name], MD   Brief Narrative:  This 62 yrs old female with PMH significant for type 2 diabetes mellitus, essential hypertension, hyperlipidemia, CVA with residual left sided weakness, presented in the ED with complaints of shortness of breath as well as nonproductive cough for last 3 days. Patient was recently hospitalized, with hospital course reported to include aspiration pneumonia as well as proctitis.  She completed a course of Unasyn for these processes ,She was discharged on mesalamine suppositories, daily.  She has an outpatient follow-up with [facility] where she was found to have tachycardia with heart rate in 140s to 150s.  Patient was sent in the ED for further evaluation. CTA chest ruled out pulmonary embolism but did show evidence of bilateral lower lobe airspace opacities concerning for pneumonia.  CT abdomen and pelvis showed findings consistent with proctitis, no bowel obstruction or abscess or perforation. Patient was admitted for sepsis secondary to recurrent Aspiration pneumonia and proctitis started on empiric antibiotics (ceftriaxone, Zithromax and Flagyl).  Assessment & Plan:   Principal Problem:   CAP (community acquired pneumonia) Active Problems:   Essential hypertension   DM2 (diabetes mellitus, type 2) (HCC)   HLD (hyperlipidemia)   Severe sepsis (HCC)   Dehydration   Acute prerenal azotemia   Proctitis   Hypercalcemia   Transaminitis   Pyuria   Depression   Protein-calorie malnutrition, severe  Severe sepsis secondary to recurrent aspiration pneumonia: Patient presented with cough, shortness of breath, and subjective fever x 3 days,  CTA chest showed bilateral lower lobe opacities consistent with pneumonia. She presented with leukocytosis, tachycardia, tachypnea, hypoxia ,lactic acid 2.9, PNA on CT chest. Lactic acid improved with IV fluid  resuscitation. Initiated on empiric antibiotics (Ceftriaxone, Zithromax, Flagyl) Blood cultures no growth so far. Continue IV fluid resuscitation. Urinary strep pneumo negative, and Legionella pending UA: showed large leucocytes. Pulmonology consulted.  Likely aspiration pneumonia secondary to dysphagia. Limit antibiotics to ceftriaxone for 5 to 7 days.  Can be discharged on Augmentin. Speech and swallow recommended pured diet. Sepsis physiology resolved.  Dehydration: Patient clinically appeared dehydrated. Continue IV fluid resuscitation.  Proctitis: Patient was recently hospitalized and found to have proctitis. She was discharged on mesalamine suppositories.   CT abdomen showed findings consistent with persistent proctitis. Gastroenterology recommended to increase mesalamine suppository to twice daily. Defer flexible sigmoidoscopy at this time.  Hypercalcemia: > Resolved. She was found to have calcium 10.1 on admission. Continue IV fluid resuscitation. Serum calcium has improved.  Elevated liver enzymes: CT abdomen showed no evidence of gallbladder pathology or any acute hepatic involvement. Held statin due to elevated liver enzymes. Liver enzymes improving.  Type 2 diabetes: Regular insulin sliding scale. Carb modified diet. Hold Trulicity.  Essential hypertension Held blood pressure medication in the setting of sepsis. Resumed blood pressure medications.  Hyperlipidemia: Resumed statin as LFTs improved.  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  CVA with left-sided weakness: Continue aspirin , statin.  Vaginal bleeding: GYN consulted.  GC / CT/ trichomoniasis / BV / Candida vaginal swabs obtained and pending. If she continues to have vaginal bleeding consult on-call gynecologist. H/H remains stable.  DVT prophylaxis: SCDs Code Status:Full code Family Communication: (No family at bed side) Disposition Plan:    Status is: Inpatient Remains inpatient  appropriate because: Admitted for recurrent pneumonia with sepsis, requiring IV antibiotics. Pulmonology consulted.  Anticipated discharge back to SNF once vaginal bleeding stopped.  Consultants:  Pulmonology Gynecology  Procedures: CT Chest, CT A/P  Antimicrobials:  Anti-infectives (From admission, onward)    Start     Dose/Rate Route Frequency Ordered Stop   10/06/22 2000  azithromycin (ZITHROMAX) tablet 500 mg  Status:  Discontinued        500 mg Oral Every 24 hours 10/06/22 1336 10/07/22 1221   10/05/22 1400  cefTRIAXone (ROCEPHIN) 1 g in sodium chloride 0.9 % 100 mL IVPB        1 g 200 mL/hr over 30 Minutes Intravenous Every 24 hours 10/04/22 2030     10/05/22 0800  metroNIDAZOLE (FLAGYL) IVPB 500 mg  Status:  Discontinued        500 mg 100 mL/hr over 60 Minutes Intravenous Every 12 hours 10/04/22 2030 10/07/22 1221   10/04/22 2045  azithromycin (ZITHROMAX) 500 mg in sodium chloride 0.9 % 250 mL IVPB  Status:  Discontinued        500 mg 250 mL/hr over 60 Minutes Intravenous Every 24 hours 10/04/22 2030 10/06/22 1336   10/04/22 2000  cefTRIAXone (ROCEPHIN) 2 g in sodium chloride 0.9 % 100 mL IVPB        2 g 200 mL/hr over 30 Minutes Intravenous  Once 10/04/22 1956 10/04/22 2111   10/04/22 2000  metroNIDAZOLE (FLAGYL) IVPB 500 mg        500 mg 100 mL/hr over 60 Minutes Intravenous  Once 10/04/22 1956 10/04/22 2150      Subjective: Patient seen and examined at bedside.  Overnight events noted.   Patient reports feeling better, she was lying comfortably in the bed. She has left-sided weakness from CVA.  She has blood noted in the vagina.   Objective: Vitals:   10/07/22 2250 10/08/22 0316 10/08/22 0500 10/08/22 0729  BP: (!) 147/90 (!) 152/97  (!) 156/92  Pulse: 95 91  98  Resp: 18 20  19   Temp: 98 F (36.7 C) 98 F (36.7 C)  98 F (36.7 C)  TempSrc: Oral Oral  Oral  SpO2: 92% 92%  94%  Weight:   66.7 kg   Height:        Intake/Output Summary (Last 24 hours)  at 10/08/2022 1024 Last data filed at 10/07/2022 1612 Gross per 24 hour  Intake --  Output 350 ml  Net -350 ml   Filed Weights   10/06/22 0500 10/07/22 0318 10/08/22 0500  Weight: 66 kg 67.3 kg 66.7 kg    Examination:  General exam: Appears comfortable, deconditioned, not in any acute distress. Respiratory system: CTA bilaterally. Respiratory effort normal.  RR 13 Cardiovascular system: S1 & S2 heard, regular rate and rhythm, no murmur Gastrointestinal system: Abdomen is soft, non tender, non distended, BS present Central nervous system: Alert and oriented X 3. Left sided weakness Extremities: No edema, No cyanosis, No clubbing Skin: No rashes, lesions or ulcers Psychiatry: Judgement and insight appear normal. Mood & affect appropriate.     Data Reviewed: I have personally reviewed following labs and imaging studies  CBC: Recent Labs  Lab 10/04/22 1621 10/05/22 0058 10/06/22 0034 10/07/22 0028 10/08/22 0022  WBC 14.7* 11.0* 7.9 7.4 5.0  NEUTROABS  --  6.8  --   --   --   HGB 12.6 10.0* 9.5* 9.8* 9.3*  HCT 39.2 30.8* 28.8* 29.7* 28.7*  MCV 79.0* 78.6* 79.8* 79.0* 77.2*  PLT 393 301 250 253 251   Basic Metabolic Panel: Recent Labs  Lab 10/04/22 1621 10/04/22 1819 10/05/22 0058 10/06/22 0034 10/07/22  0028  NA 136  --  138 138 138  K 4.2  --  4.3 3.5 3.4*  CL 102  --  107 107 107  CO2 21*  --  24 22 23   GLUCOSE 231*  --  168* 110* 154*  BUN 20  --  15 14 8   CREATININE 0.98  --  0.63 0.72 0.73  CALCIUM 10.1  --  9.3 9.2 9.2  MG  --  2.0 2.0 1.9 1.8  PHOS  --   --  3.5 3.4 3.2   GFR: Estimated Creatinine Clearance: 67.6 mL/min (by C-G formula based on SCr of 0.73 mg/dL). Liver Function Tests: Recent Labs  Lab 10/04/22 1621 10/05/22 0058 10/07/22 0028  AST 94* 62* 28  ALT 180* 129* 69*  ALKPHOS 152* 111 96  BILITOT 0.5 0.4 0.5  PROT 8.1 6.2* 6.1*  ALBUMIN 3.2* 2.5* 2.4*   No results for input(s): "LIPASE", "AMYLASE" in the last 168 hours. No results  for input(s): "AMMONIA" in the last 168 hours. Coagulation Profile: Recent Labs  Lab 10/05/22 0058  INR 1.1   Cardiac Enzymes: No results for input(s): "CKTOTAL", "CKMB", "CKMBINDEX", "TROPONINI" in the last 168 hours. BNP (last 3 results) No results for input(s): "PROBNP" in the last 8760 hours. HbA1C: No results for input(s): "HGBA1C" in the last 72 hours. CBG: Recent Labs  Lab 10/07/22 1108 10/07/22 1542 10/07/22 2100 10/08/22 0634 10/08/22 0802  GLUCAP 126* 93 222* 138* 141*   Lipid Profile: No results for input(s): "CHOL", "HDL", "LDLCALC", "TRIG", "CHOLHDL", "LDLDIRECT" in the last 72 hours. Thyroid Function Tests: No results for input(s): "TSH", "T4TOTAL", "FREET4", "T3FREE", "THYROIDAB" in the last 72 hours. Anemia Panel: No results for input(s): "VITAMINB12", "FOLATE", "FERRITIN", "TIBC", "IRON", "RETICCTPCT" in the last 72 hours. Sepsis Labs: Recent Labs  Lab 10/04/22 1626 10/04/22 1819 10/05/22 0058  PROCALCITON  --   --  0.75  LATICACIDVEN 2.9* 1.5  --     Recent Results (from the past 240 hour(s))  Culture, blood (routine x 2)     Status: None (Preliminary result)   Collection Time: 10/04/22  4:10 PM   Specimen: BLOOD  Result Value Ref Range Status   Specimen Description BLOOD RIGHT ANTECUBITAL  Final   Special Requests   Final    BOTTLES DRAWN AEROBIC AND ANAEROBIC Blood Culture results may not be optimal due to an excessive volume of blood received in culture bottles   Culture   Final    NO GROWTH 4 DAYS Performed at [facility], [address] [place], [place] [address]    Report Status PENDING  Incomplete  Culture, blood (routine x 2)     Status: None (Preliminary result)   Collection Time: 10/04/22  7:40 PM   Specimen: BLOOD RIGHT HAND  Result Value Ref Range Status   Specimen Description BLOOD RIGHT HAND  Final   Special Requests   Final    BOTTLES DRAWN AEROBIC ONLY Blood Culture adequate volume   Culture   Final    NO GROWTH 4  DAYS Performed at [facility], [address] [place], [place] [address]    Report Status PENDING  Incomplete  MRSA Next Gen by PCR, Nasal     Status: None   Collection Time: 10/05/22  5:34 AM   Specimen: Nasal Mucosa; Nasal Swab  Result Value Ref Range Status   MRSA by PCR Next Gen NOT DETECTED NOT DETECTED Final    Comment: (NOTE) The GeneXpert  MRSA Assay (FDA approved for NASAL specimens only), is one component of a comprehensive MRSA colonization surveillance program. It is not intended to diagnose MRSA infection nor to guide or monitor treatment for MRSA infections. Test performance is not FDA approved in patients less than 16 years old. Performed at [facility], [address], [place], [place] [address]     Radiology Studies: No results found.  Scheduled Meds:  aspirin EC  81 mg Oral Daily   Chlorhexidine Gluconate Cloth  6 each Topical Daily   ezetimibe  10 mg Oral Daily   FLUoxetine  20 mg Oral Daily   insulin aspart  0-9 Units Subcutaneous TID WC   mesalamine  1,000 mg Rectal BID   multivitamin with minerals  1 tablet Oral Daily   pantoprazole  40 mg Oral Daily   Continuous Infusions:  cefTRIAXone (ROCEPHIN)  IV 1 g (10/07/22 1407)     LOS: 4 days    Time spent: 35 mins.    Willeen Niece, MD [facility]   If 7PM-7AM, please contact night-coverage

## 2022-10-08 NOTE — Progress Notes (Signed)
Mobility Specialist Progress Note   10/08/22 1239  Mobility  Activity Transferred from bed to chair  Level of Assistance +2 (takes two people) Audiological scientist)  Assistive Device Stedy  Activity Response Tolerated well  Mobility Referral Yes  $Mobility charge 1 Mobility  Mobility Specialist Start Time (ACUTE ONLY) 1215  Mobility Specialist Stop Time (ACUTE ONLY) 1235  Mobility Specialist Time Calculation (min) (ACUTE ONLY) 20 min   Received in bed w/o complaint and eager for mobility. Pt limited by L hemiparesis but able to get EOB w/ modA, pt having limited trunk control when trying to maintain seated position. +2A minA to stand x3 attempts to assist w/ pericare and transfer to chair. No incidents throughout, Left w/ call bell in reach and RN and NT present in room.    Frederico Hamman Mobility Specialist Please contact via SecureChat or  Rehab office at [phone]

## 2022-10-08 NOTE — Progress Notes (Signed)
Mobility Specialist Progress Note   10/08/22 1439  Mobility  Activity Transferred from chair to bed  Level of Assistance Moderate assist, patient does 50-74%  Assistive Device Stedy  Activity Response Tolerated well  Mobility Referral Yes  $Mobility charge 1 Mobility  Mobility Specialist Start Time (ACUTE ONLY) 1406  Mobility Specialist Stop Time (ACUTE ONLY) 1436  Mobility Specialist Time Calculation (min) (ACUTE ONLY) 30 min   Pt requesting assistance to get from chair to bed d/t glute discomfort. Required ModA to stand 3x for pericare. Pt limited in strength on left side and requiring more support but no faults throughout. Placed pt back in bed w/ modA d/t increased fatigue. Left supine in bed with all needs me call bell in reach and bed alarm on.  Frederico Hamman Mobility Specialist Please contact via SecureChat or  Rehab office at [phone]

## 2022-10-09 DIAGNOSIS — J189 Pneumonia, unspecified organism: Secondary | ICD-10-CM | POA: Diagnosis not present

## 2022-10-09 DIAGNOSIS — K6289 Other specified diseases of anus and rectum: Secondary | ICD-10-CM | POA: Diagnosis not present

## 2022-10-09 DIAGNOSIS — J690 Pneumonitis due to inhalation of food and vomit: Secondary | ICD-10-CM | POA: Diagnosis not present

## 2022-10-09 LAB — GLUCOSE, CAPILLARY
Glucose-Capillary: 143 mg/dL — ABNORMAL HIGH (ref 70–99)
Glucose-Capillary: 115 mg/dL — ABNORMAL HIGH (ref 70–99)
Glucose-Capillary: 137 mg/dL — ABNORMAL HIGH (ref 70–99)
Glucose-Capillary: 116 mg/dL — ABNORMAL HIGH (ref 70–99)

## 2022-10-09 LAB — MAGNESIUM: Magnesium: 1.9 mg/dL (ref 1.7–2.4)

## 2022-10-09 LAB — COMPREHENSIVE METABOLIC PANEL
ALT: 47 U/L — ABNORMAL HIGH (ref 0–44)
AST: 23 U/L (ref 15–41)
Albumin: 2.5 g/dL — ABNORMAL LOW (ref 3.5–5.0)
Alkaline Phosphatase: 89 U/L (ref 38–126)
Anion gap: 9 (ref 5–15)
BUN: 6 mg/dL — ABNORMAL LOW (ref 8–23)
CO2: 22 mmol/L (ref 22–32)
Calcium: 9.3 mg/dL (ref 8.9–10.3)
Chloride: 110 mmol/L (ref 98–111)
Creatinine, Ser: 0.66 mg/dL (ref 0.44–1.00)
GFR, Estimated: >60 mL/min (ref >60)
Glucose, Bld: 128 mg/dL — ABNORMAL HIGH (ref 70–99)
Potassium: 3.1 mmol/L — ABNORMAL LOW (ref 3.5–5.1)
Sodium: 141 mmol/L (ref 135–145)
Total Bilirubin: 0.4 mg/dL (ref 0.3–1.2)
Total Protein: 6 g/dL — ABNORMAL LOW (ref 6.5–8.1)

## 2022-10-09 LAB — CBC
HCT: 31.6 % — ABNORMAL LOW (ref 36.0–46.0)
Hemoglobin: 10.1 g/dL — ABNORMAL LOW (ref 12.0–15.0)
MCH: 25 pg — ABNORMAL LOW (ref 26.0–34.0)
MCHC: 32 g/dL (ref 30.0–36.0)
MCV: 78.2 fL — ABNORMAL LOW (ref 80.0–100.0)
Platelets: 290 10*3/uL (ref 150–400)
RBC: 4.04 MIL/uL (ref 3.87–5.11)
RDW: 15.5 % (ref 11.5–15.5)
WBC: 7.4 10*3/uL (ref 4.0–10.5)
nRBC: 0 % (ref 0.0–0.2)

## 2022-10-09 LAB — PHOSPHORUS: Phosphorus: 2.9 mg/dL (ref 2.5–4.6)

## 2022-10-09 LAB — CULTURE, BLOOD (ROUTINE X 2)
Culture: NO GROWTH
Special Requests: ADEQUATE

## 2022-10-09 MED ORDER — SENNOSIDES-DOCUSATE SODIUM 8.6-50 MG PO TABS: 1.0000 | ORAL_TABLET | Freq: Every evening | ORAL | Status: DC | PRN

## 2022-10-09 MED ORDER — METOPROLOL TARTRATE 5 MG/5ML IV SOLN: 5.0000 mg | INTRAVENOUS | Status: DC | PRN

## 2022-10-09 MED ORDER — HYDRALAZINE HCL 20 MG/ML IJ SOLN: 10.0000 mg | INTRAMUSCULAR | Status: DC | PRN

## 2022-10-09 MED ORDER — GUAIFENESIN 100 MG/5ML PO LIQD
5.0000 mL | ORAL | Status: DC | PRN
  Administered 2022-10-11: 5 mL via ORAL
  Filled 2022-10-09: qty 10

## 2022-10-09 MED ORDER — TRAZODONE HCL 50 MG PO TABS: 50.0000 mg | ORAL_TABLET | Freq: Every evening | ORAL | Status: DC | PRN

## 2022-10-09 MED ORDER — POTASSIUM CHLORIDE CRYS ER 20 MEQ PO TBCR
40.0000 meq | EXTENDED_RELEASE_TABLET | Freq: Once | ORAL | Status: AC
  Administered 2022-10-09: 40 meq via ORAL
  Filled 2022-10-09: qty 2

## 2022-10-09 MED ORDER — POTASSIUM CHLORIDE 10 MEQ/100ML IV SOLN
10.0000 meq | INTRAVENOUS | Status: AC
  Administered 2022-10-09 (×4): 10 meq via INTRAVENOUS
  Filled 2022-10-09: qty 100

## 2022-10-09 MED ORDER — POLYETHYLENE GLYCOL 3350 17 G PO PACK
17.0000 g | PACK | Freq: Every day | ORAL | Status: DC
  Administered 2022-10-09 – 2022-10-13 (×2): 17 g via ORAL
  Filled 2022-10-09 (×4): qty 1

## 2022-10-09 MED ORDER — IPRATROPIUM-ALBUTEROL 0.5-2.5 (3) MG/3ML IN SOLN: 3.0000 mL | RESPIRATORY_TRACT | Status: DC | PRN

## 2022-10-09 NOTE — Progress Notes (Addendum)
Pharmacy note: Admission medications  MD requested the admission medications. Previous attempts have been made the following list reflects fill dates on record since 05/02/2022.    Medication Frequency Last fill  Amlodipine 10mg  Daily 09/01/22  Atorvastatin 80mg  Daily 09/01/22  Ezetimibe 10mg  Daily 09/01/22  Fluoxetine 20mg  Daily 09/01/22  Hydralazine 50mg  TID 09/01/22  Hydroxyzine 50mg  Bid 09/01/22  Insulin aspartate Sliding scale 09/03/22  Insulin glargine *Dispense record does not list instructions but last listed in an office visit 10/04/22) as 5 units daily 09/28/22  Lisinopril/hydrochlorothiazide 20/12.5mg  Daily  *Last office visit (09/12/22 does not list this medication) 05/09/22; 90 day  Mesalamine 1000mg  suppository  Daily at night 09/30/22  Mesalamine 4gm enema Rectally at night 5/7//24  Methocarbamol 500mg  Bid 09/02/22  Ticagrelor 90 Bid 09/01/22  Tizanidine *Dispense record does not list instructions but last listed in an office visit (10/04/22)  as 2mg  tablet q8hrPRN for muscle spasms   Tramadol 50mg  1 tab q6PRN 09/21/22; 5 day supply   -per Neurology (10/04/22), ticagrelor treatment was completed and plans were for aspirin monotherapy  Will have someone try to re-attempt on 6/10.  Harland German, PharmD Clinical Pharmacist **Pharmacist phone directory can now be found on [email or web address] ([email or web address]).  Listed under [facility].

## 2022-10-09 NOTE — Consult Note (Addendum)
Consultation  Referring Provider:  [facility]  Primary Care Physician:  [place], [name], MD Primary Gastroenterologist:  Dr. Lavon Paganini       Reason for Consultation:     rectal pain/proctitis  LOS: 5 days          HPI:   [name] is a 62 y.o. 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  Recently admitted 3/6-3/11 for painless hematochezia while on brillanta. 07/08/2022 colonoscopy due to rectal bleeding and anemia on Brilinta shows 3 mm polyp ascending, 3 mm polyp descending, polypoid changes sigmoid colon, tortuous colon, poor anal tone difficult to insufflate left colon/rectum, few ulcers in distal rectum, internal hemorrhoids Pathology showed active proctitis/no evidence of chronicity, granuloma, dysplasia or malignancy.  Negative CMV  Patient was given canasa and carafate suppositories. These were not obtained for she took anusol and eventually received Canasa 1g suppository. Concern for stercoral ulcers..  She was readmitted to the hospital 09/23/2022 with N/V, dehydration and aspiration pneumonia. Treated with Unasyn -> Augmentin then discharged to the SNF on 09/27/2022.   She then saw [name] in office 10/04/22 and during visit she had active vomiting and tachycardia and was sent to ED for further evaluation.  Patient subsequently admitted. CTA chest was negative for PE but did show bilateral multifocal opacity.   CT ab/pelvis showed new inferior rectal wall thickening and inflammation with new perirectal lymph nodes. Findings are worrisome for proctitis. Neoplasm not excluded. Increased diffuse retroperitoneal, mesenteric, pelvic sidewall, inguinal, and periportal  lymphadenopathy.  Patient admitted for sepsis due to recurrent aspiration pneumonia/proctitis on empiric Rocephin, azithromycin and Flagyl. GI consulted for persistent rectal pain and abnormal CT.  Patient reports persistent rectal pain worse with bowel movements and occurs every 15 minutes without a bowel movement. Denies abdominal pain, nausea, or vomiting. Feels her respiratory status/wheezing is improved.   Past Medical History:  Diagnosis Date   Abnormality of gait 05/10/2010   BACK PAIN 11/14/2008   Class 1 obesity due to excess calories with body mass index (BMI) of 31.0 to 31.9 in adult 02/07/2021   DIABETES MELLITUS, TYPE II 07/15/2008   Diplopia 07/15/2008   ECZEMA, ATOPIC 04/03/2009   GERD (gastroesophageal reflux disease)    Guillain-Barre (HCC) 1988   HYPERLIPIDEMIA 03/06/2009   HYPERTENSION 07/15/2008   Stroke (HCC) 2010, 2011   x2    Vertebral artery stenosis     Surgical History:  She  has a past surgical history that includes Abdominal hysterectomy; Foot surgery; Dilation and curettage of uterus; IR ANGIO INTRA EXTRACRAN SEL INTERNAL CAROTID UNI R MOD SED (09/17/2021); IR US Guide Vasc Access Right (09/17/2021); IR ANGIO VERTEBRAL SEL VERTEBRAL UNI R MOD SED (09/17/2021); IR ANGIO INTRA EXTRACRAN SEL COM CAROTID INNOMINATE UNI L MOD SED (09/17/2021); Colonoscopy with propofol (N/A, 07/08/2022); and biopsy (07/08/2022). Family History:  Her family history includes Asthma in an other family member; Cancer in her father; Diabetes in her mother and sister; Hypertension in an other family member; Stroke in her mother and another family member. Social History:   reports that she has never smoked. She has never used smokeless tobacco. She reports that she does not drink alcohol and does not use drugs.  Prior to Admission medications   Medication Sig Start  Date End Date Taking? Authorizing Provider  LANTUS SOLOSTAR 100 UNIT/ML Solostar Pen Inject into the skin. 09/28/22  Yes  [provider]  acetaminophen (TYLENOL) 325 MG tablet Take 650 mg by mouth every 4 (four) hours as needed for mild pain. Do not exceed 3000mg  in 24 hours    [provider]  AMBULATORY NON FORMULARY MEDICATION Medication Name: Carafate 50 mg suppositories: Insert 1 suppository rectally twice a day for 2 weeks 08/31/22   Doree Albee, PA-C  AMBULATORY NON FORMULARY MEDICATION Medication Name: Diltiazem 2%/Lidocaine 2%   Using your index finger apply a small amount of medication inside the anal opening and to the external anal area twice daily x 6 weeks. Patient not taking: Reported on 09/24/2022 08/31/22   Doree Albee, PA-C  amLODipine (NORVASC) 10 MG tablet Take 1 tablet by mouth once daily 05/09/22   [place], [name], MD  aspirin EC 81 MG tablet Take 1 tablet (81 mg total) by mouth daily. Swallow whole. 06/18/22   Osvaldo Shipper, MD  atorvastatin (LIPITOR) 80 MG tablet Take 1 tablet (80 mg total) by mouth daily. 05/09/22   [place], [name], MD  B Complex Vitamins (VITAMIN B COMPLEX) TABS Take 1 tablet by mouth every morning.    [provider]  Blood Pressure Monitoring (BLOOD PRESSURE MONITOR AUTOMAT) DEVI 1 Device by Does not apply route daily. 08/29/18   [place], [name], MD  cyanocobalamin 1000 MCG tablet Take 1,000 mcg by mouth daily.    [provider]  Dulaglutide (TRULICITY) 0.75 MG/0.5ML SOPN Inject 0.75 mg into the skin once a week. 04/13/22   Shamleffer, Konrad Dolores, MD  ezetimibe (ZETIA) 10 MG tablet Take 1 tablet (10 mg total) by mouth daily. 05/09/22   [place], [name], MD  FLUoxetine (PROZAC) 20 MG capsule Take 1 capsule (20 mg total) by mouth daily. 09/27/22   Rhetta Mura, MD  hydrocortisone (ANUSOL-HC) 25 MG suppository Place 25 mg rectally 2 (two) times daily as needed for hemorrhoids or anal itching.    [provider]  hydrOXYzine (ATARAX) 10 MG tablet Take 1 tablet (10 mg total) by mouth 3 (three) times daily as needed for  anxiety. 09/27/22   Rhetta Mura, MD  insulin glargine-yfgn (SEMGLEE) 100 UNIT/ML injection Inject 0.05 mLs (5 Units total) into the skin daily. 07/12/22   Hongalgi, Maximino Greenland, MD  Lidocaine 5 % CREA Apply 1 Application topically every 6 (six) hours as needed (rectal pain).    [provider]  Menthol, Topical Analgesic, (BIOFREEZE) 4 % GEL Apply 1 Application topically in the morning. Neck and shoulders.    [provider]  mesalamine (CANASA) 1000 MG suppository Place 1,000 mg rectally at bedtime.    [provider]  methocarbamol (ROBAXIN) 500 MG tablet Take 500 mg by mouth 2 (two) times daily.    [provider]  omeprazole (PRILOSEC OTC) 20 MG tablet Take 20 mg by mouth daily.    [provider]  ONE TOUCH LANCETS MISC USE TO CHECK BLOOD SUGAR TWICE A DAY AND PRN 07/29/15   Gordy Savers, MD  OXYGEN Inhale 2 L/min into the lungs as needed.    [provider]  polyethylene glycol (MIRALAX / GLYCOLAX) 17 g packet Take 17 g by mouth every other day. 09/27/22   Rhetta Mura, MD  saccharomyces boulardii (FLORASTOR) 250 MG capsule Take 1 capsule (250 mg total) by mouth 2 (two) times daily. 06/17/22   Osvaldo Shipper, MD  ticagrelor (  BRILINTA) 90 MG TABS tablet Take 90 mg by mouth 2 (two) times daily.    [provider]  tiZANidine (ZANAFLEX) 2 MG tablet Take 1 tablet (2 mg total) by mouth 2 (two) times daily. 10/04/22   Glendale Chard, DO    Current Facility-Administered Medications  Medication Dose Route Frequency Provider Last Rate Last Admin   acetaminophen (TYLENOL) tablet 650 mg  650 mg Oral Q6H PRN Howerter, Justin B, DO   650 mg at 10/05/22 1617   Or   acetaminophen (TYLENOL) suppository 650 mg  650 mg Rectal Q6H PRN Howerter, Justin B, DO       amLODipine (NORVASC) tablet 10 mg  10 mg Oral Daily Willeen Niece, MD   10 mg at 10/09/22 1059   aspirin EC tablet 81 mg  81 mg Oral Daily Howerter, Justin B, DO   81  mg at 10/09/22 1059   atorvastatin (LIPITOR) tablet 80 mg  80 mg Oral Daily Willeen Niece, MD   80 mg at 10/09/22 1101   cefTRIAXone (ROCEPHIN) 1 g in sodium chloride 0.9 % 100 mL IVPB  1 g Intravenous Q24H Howerter, Justin B, DO 200 mL/hr at 10/08/22 1524 1 g at 10/08/22 1524   Chlorhexidine Gluconate Cloth 2 % PADS 6 each  6 each Topical Daily Willeen Niece, MD   6 each at 10/09/22 0920   ezetimibe (ZETIA) tablet 10 mg  10 mg Oral Daily Howerter, Justin B, DO   10 mg at 10/09/22 1059   FLUoxetine (PROZAC) capsule 20 mg  20 mg Oral Daily Howerter, Justin B, DO   20 mg at 10/09/22 1101   guaiFENesin (ROBITUSSIN) 100 MG/5ML liquid 5 mL  5 mL Oral Q4H PRN Amin, Ankit Chirag, MD       hydrALAZINE (APRESOLINE) injection 10 mg  10 mg Intravenous Q4H PRN Amin, Ankit Chirag, MD       hydrOXYzine (ATARAX) tablet 10 mg  10 mg Oral TID PRN Howerter, Justin B, DO       insulin aspart (novoLOG) injection 0-9 Units  0-9 Units Subcutaneous TID WC Howerter, Justin B, DO   1 Units at 10/09/22 0655   ipratropium-albuterol (DUONEB) 0.5-2.5 (3) MG/3ML nebulizer solution 3 mL  3 mL Nebulization Q4H PRN Amin, Ankit Chirag, MD       lip balm (CARMEX) ointment   Topical PRN Willeen Niece, MD       melatonin tablet 3 mg  3 mg Oral QHS PRN Howerter, Justin B, DO   3 mg at 10/05/22 2214   mesalamine (CANASA) suppository 1,000 mg  1,000 mg Rectal BID Howerter, Justin B, DO   1,000 mg at 10/09/22 1059   metoprolol tartrate (LOPRESSOR) injection 5 mg  5 mg Intravenous Q4H PRN Amin, Ankit Chirag, MD       morphine (PF) 4 MG/ML injection 4 mg  4 mg Intravenous Q3H PRN Howerter, Justin B, DO       multivitamin with minerals tablet 1 tablet  1 tablet Oral Daily Willeen Niece, MD   1 tablet at 10/09/22 1059   naloxone (NARCAN) injection 0.4 mg  0.4 mg Intravenous PRN Howerter, Justin B, DO       ondansetron (ZOFRAN) injection 4 mg  4 mg Intravenous Q6H PRN Howerter, Justin B, DO   4 mg at 10/07/22 0523   pantoprazole  (PROTONIX) EC tablet 40 mg  40 mg Oral Daily Howerter, Justin B, DO   40 mg at 10/09/22 1101   polyethylene glycol (  MIRALAX / GLYCOLAX) packet 17 g  17 g Oral Purcell Mouton, Pardeep, MD   17 g at 10/08/22 1228   potassium chloride 10 mEq in 100 mL IVPB  10 mEq Intravenous Q1 Hr x 4 Amin, Ankit Chirag, MD 100 mL/hr at 10/09/22 1227 10 mEq at 10/09/22 1227   promethazine (PHENERGAN) tablet 12.5 mg  12.5 mg Oral Q6H PRN Willeen Niece, MD       senna-docusate (Senokot-S) tablet 1 tablet  1 tablet Oral QHS PRN Amin, Ankit Chirag, MD       tiZANidine (ZANAFLEX) tablet 2 mg  2 mg Oral BID Willeen Niece, MD   2 mg at 10/09/22 1100   traZODone (DESYREL) tablet 50 mg  50 mg Oral QHS PRN Dimple Nanas, MD        Allergies as of 10/04/2022   (No Known Allergies)    Review of Systems  Constitutional:  Negative for chills, fever and weight loss.  HENT:  Negative for ear pain and hearing loss.   Eyes:  Negative for blurred vision and double vision.  Respiratory:  Negative for cough and hemoptysis.   Cardiovascular:  Negative for chest pain and palpitations.  Gastrointestinal:  Negative for abdominal pain, blood in stool, constipation, diarrhea, heartburn, melena, nausea and vomiting.  Genitourinary:  Negative for dysuria and urgency.  Musculoskeletal:  Negative for myalgias and neck pain.  Skin:  Negative for itching and rash.  Neurological:  Negative for seizures and loss of consciousness.  Psychiatric/Behavioral:  Negative for depression and suicidal ideas.        Physical Exam:  Vital signs in last 24 hours: Temp:  [97.6 F (36.4 C)-98.2 F (36.8 C)] 97.6 F (36.4 C) (06/09 1100) Pulse Rate:  [86-101] 98 (06/09 1100) Resp:  [19-23] 22 (06/09 1100) BP: (143-158)/(83-95) 158/95 (06/09 1100) SpO2:  [91 %-94 %] 93 % (06/09 1100) Weight:  [67.1 kg] 67.1 kg (06/09 0310) Last BM Date : 10/08/22 Last BM recorded by nurses in past 5 days Stool Type: Type 6 (Mushy consistency with ragged  edges) (10/09/2022  6:30 AM)  Physical Exam Constitutional:      Appearance: She is ill-appearing.  HENT:     Nose: Nose normal. No congestion.     Mouth/Throat:     Mouth: Mucous membranes are moist.     Pharynx: Oropharynx is clear.  Eyes:     Extraocular Movements: Extraocular movements intact.     Conjunctiva/sclera: Conjunctivae normal.  Cardiovascular:     Rate and Rhythm: Normal rate and regular rhythm.  Pulmonary:     Effort: Pulmonary effort is normal. No respiratory distress.  Abdominal:     General: Abdomen is flat. Bowel sounds are normal. There is no distension.     Palpations: Abdomen is soft. There is no mass.     Tenderness: There is no abdominal tenderness. There is no guarding or rebound.     Hernia: No hernia is present.  Genitourinary:    Comments: Patient politely declined rectal exam Musculoskeletal:        General: No swelling. Normal range of motion.     Cervical back: Normal range of motion and neck supple.  Skin:    General: Skin is warm and dry.     Coloration: Skin is not jaundiced.  Neurological:     General: No focal deficit present.     Mental Status: She is alert and oriented to person, place, and time.  Psychiatric:  Mood and Affect: Mood normal.        Behavior: Behavior normal.        Thought Content: Thought content normal.        Judgment: Judgment normal.      LAB RESULTS: Recent Labs    10/07/22 0028 10/08/22 0022 10/09/22 0015  WBC 7.4 5.0 7.4  HGB 9.8* 9.3* 10.1*  HCT 29.7* 28.7* 31.6*  PLT 253 251 290   BMET Recent Labs    10/07/22 0028 10/09/22 0015  NA 138 141  K 3.4* 3.1*  CL 107 110  CO2 23 22  GLUCOSE 154* 128*  BUN 8 6*  CREATININE 0.73 0.66  CALCIUM 9.2 9.3   LFT Recent Labs    10/09/22 0015  PROT 6.0*  ALBUMIN 2.5*  AST 23  ALT 47*  ALKPHOS 89  BILITOT 0.4   PT/INR No results for input(s): "LABPROT", "INR" in the last 72 hours.  STUDIES: No results found.    Impression     Proctitis - CT ab/pelvis showed new inferior rectal wall thickening and inflammation with new perirectal lymph nodes. Findings are worrisome for proctitis. Neoplasm not excluded. Increased diffuse retroperitoneal, mesenteric, pelvic sidewall, inguinal, and periportal lymphadenopathy. -empiric Rocephin, azithromycin and Flagyl - hgb 10.1 - WBC 7.4, improved - Potassium 3.1 - On mesalamine suppositories twice daily with no improvement Patient having persistent rectal pain and CT showing continued proctitis despite canasa suppositories. Will likely need repeat flex sig for further evaluation. Appears patient is more stable at this time and can likely undergo procedure in the next few days.  Aspiration pneumonia    Plan   - continue mesalamine suppositories - recommend adding on carafate enemas - patient currently on Miralax every other day, would recommend doing a more scheduled dose such as QD or BID and see how patient responds. - continue supportive care - Consider repeat flex sig during admission. Timing TBD.  Thank you for your kind consultation, we will continue to follow.   [name]  10/09/2022, 1:01 PM     Attending physician's note   I have taken history, reviewed the chart and examined the patient. I performed a substantive portion of this encounter, including complete performance of at least one of the key components, in conjunction with the APP. I agree with the Advanced Practitioner's note, impression and recommendations.   Adm d/t aspiration pneumonia.  GI consulted due to rectal pain/some rectal bleeding and CT Abdo/pelvis showing rectal wall thickening with new perirectal lymph nodes. R/O rectal masses. Pt with recent adm 3/6-3/11 for painless hematochezia while on Brilinta.  Colonoscopy 07/08/2022-Limited rectal evaluation.  Did show ulcers in the distal rectum.  Likely stercoral. Bx- active proctitis, no malignancy, neg CMV.  I did a rectal exam.  She had  rectal stool impaction.  I was able to manually disimpact.  No obvious masses.  Some blood on hard stool balls. Pt not keen on getting enemas  Plan: -Miralax 17g po BID -Trend CBC -Reasonable to do flexible sigmoidoscopy in the next few days once pneumonia is better. -Continue Canasa suppositories for now -D/W sister    Lisa Circle, MD [facility] [phone]

## 2022-10-09 NOTE — Progress Notes (Signed)
PROGRESS NOTE    [name]  WUJ:[id number] DOB: 02-05-1961 DOA: 10/04/2022 PCP: [place], [name], MD   Brief Narrative:  62 year old with history of DM2, HTN, HLD, CVA with residual left-sided weakness comes to the ED with complaints of shortness of breath.  Patient was recently hospitalized for aspiration pneumonia/proctitis completed a course of Unasyn and was discharged on mesalamine suppository.  When seen by outpatient [facility] gastroenterology patient was noted to be tachycardic therefore sent to the ER.  CTA chest was negative for PE but did show bilateral multifocal opacity.  CT of the abdomen pelvis was negative.  Patient admitted for sepsis due to recurrent aspiration pneumonia/proctitis on empiric Rocephin, azithromycin and Flagyl.   Assessment & Plan:  Principal Problem:   CAP (community acquired pneumonia) Active Problems:   Essential hypertension   DM2 (diabetes mellitus, type 2) (HCC)   HLD (hyperlipidemia)   Severe sepsis (HCC)   Dehydration   Acute prerenal azotemia   Proctitis   Hypercalcemia   Transaminitis   Pyuria   Depression   Protein-calorie malnutrition, severe     Severe sepsis secondary to recurrent aspiration pneumonia Mild urinary tract infection with possible Pyelonephritis.  Initially showing signs of sepsis but now they are improving.  CTA showed bilateral multifocal opacities.  Procalcitonin 0.7, BNP negative. - Currently on Rocephin, will DC on Augmentin.  Seen by pulmonary - Cultures remain negative.  Legionella/strep-negative -Speech and swallow-regular diet, nectar thick   Bilateral hilar lymphadenopathy Suspect from underlying infection.  This is seen on the CT scan.  Needs to follow-up outpatient and have a repeat scan to ensure this is nothing else concerning   Proctitis: With significant rectal pain Found during recent hospitalization discharged on mesalamine suppository which we will continue.  CT shows persistent proctitis.  Will  discuss with GI again to see how we can help     Hypokalemia/hypocalcemia Replete as needed   Elevated liver enzymes: Overall stable   Type 2 diabetes: Holding Trulicity.  Sliding scale and Accu-Cheks   Essential hypertension Currently on Norvasc.  IV as needed   Hyperlipidemia: Resumed statin as LFTs improved.   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   CVA with left-sided weakness: Aspirin and Lipitor   Vaginal bleeding: GYN consulted.  GC / CT/ trichomoniasis / BV / Candida vaginal swabs-pending   DVT prophylaxis: SCDs Code Status:Full code Family Communication: (No family at bed side) Disposition Plan:     Status is: Inpatient Remains inpatient appropriate because: Admitted for recurrent pneumonia with sepsis, requiring IV antibiotics. Pulmonology consulted.  Anticipated discharge back to SNF once vaginal bleeding stopped. Likely Monday        Diet Orders (From admission, onward)     Start     Ordered   10/06/22 1305  Diet regular Room service appropriate? Yes with Assist; Fluid consistency: Nectar Thick  Diet effective now       Question Answer Comment  Room service appropriate? Yes with Assist   Fluid consistency: Nectar Thick      10/06/22 1304            Subjective: Still having persistent rectal pain.   Examination:  General exam: Appears calm and comfortable  Respiratory system: Mild bilateral rhonchi Cardiovascular system: S1 & S2 heard, RRR. No JVD, murmurs, rubs, gallops or clicks. No pedal edema. Gastrointestinal system: Abdomen is nondistended, soft and nontender. No organomegaly or masses felt. Normal bowel sounds heard. Central nervous system: Alert and oriented. No focal neurological deficits. Extremities: Symmetric  5 x 5 power. Skin: No rashes, lesions or ulcers Psychiatry: Judgement and insight appear normal. Mood & affect appropriate.  Objective: Vitals:   10/08/22 1928 10/08/22 2303 10/09/22 0310 10/09/22 0700  BP: (!)  145/83 (!) 153/94 (!) 158/95 (!) 150/92  Pulse: (!) 101 99 97 96  Resp: 19 19 20  (!) 23  Temp: 98.1 F (36.7 C) 98.1 F (36.7 C) 97.6 F (36.4 C) 97.7 F (36.5 C)  TempSrc: Oral Oral Oral Oral  SpO2: 91% 91% 93% 94%  Weight:   67.1 kg   Height:        Intake/Output Summary (Last 24 hours) at 10/09/2022 0758 Last data filed at 10/09/2022 0630 Gross per 24 hour  Intake --  Output 300 ml  Net -300 ml   Filed Weights   10/07/22 0318 10/08/22 0500 10/09/22 0310  Weight: 67.3 kg 66.7 kg 67.1 kg    Scheduled Meds:  amLODipine  10 mg Oral Daily   aspirin EC  81 mg Oral Daily   atorvastatin  80 mg Oral Daily   Chlorhexidine Gluconate Cloth  6 each Topical Daily   ezetimibe  10 mg Oral Daily   FLUoxetine  20 mg Oral Daily   insulin aspart  0-9 Units Subcutaneous TID WC   mesalamine  1,000 mg Rectal BID   multivitamin with minerals  1 tablet Oral Daily   pantoprazole  40 mg Oral Daily   polyethylene glycol  17 g Oral QODAY   tiZANidine  2 mg Oral BID   Continuous Infusions:  cefTRIAXone (ROCEPHIN)  IV 1 g (10/08/22 1524)    Nutritional status Signs/Symptoms: percent weight loss, moderate muscle depletion, severe muscle depletion Percent weight loss: 22.3 % Interventions: Magic cup, MVI, Refer to RD note for recommendations Body mass index is 25.39 kg/m.  Data Reviewed:   CBC: Recent Labs  Lab 10/05/22 0058 10/06/22 0034 10/07/22 0028 10/08/22 0022 10/09/22 0015  WBC 11.0* 7.9 7.4 5.0 7.4  NEUTROABS 6.8  --   --   --   --   HGB 10.0* 9.5* 9.8* 9.3* 10.1*  HCT 30.8* 28.8* 29.7* 28.7* 31.6*  MCV 78.6* 79.8* 79.0* 77.2* 78.2*  PLT 301 250 253 251 290   Basic Metabolic Panel: Recent Labs  Lab 10/04/22 1621 10/04/22 1819 10/05/22 0058 10/06/22 0034 10/07/22 0028 10/09/22 0015  NA 136  --  138 138 138 141  K 4.2  --  4.3 3.5 3.4* 3.1*  CL 102  --  107 107 107 110  CO2 21*  --  24 22 23 22   GLUCOSE 231*  --  168* 110* 154* 128*  BUN 20  --  15 14 8  6*   CREATININE 0.98  --  0.63 0.72 0.73 0.66  CALCIUM 10.1  --  9.3 9.2 9.2 9.3  MG  --  2.0 2.0 1.9 1.8 1.9  PHOS  --   --  3.5 3.4 3.2 2.9   GFR: Estimated Creatinine Clearance: 67.8 mL/min (by C-G formula based on SCr of 0.66 mg/dL). Liver Function Tests: Recent Labs  Lab 10/04/22 1621 10/05/22 0058 10/07/22 0028 10/09/22 0015  AST 94* 62* 28 23  ALT 180* 129* 69* 47*  ALKPHOS 152* 111 96 89  BILITOT 0.5 0.4 0.5 0.4  PROT 8.1 6.2* 6.1* 6.0*  ALBUMIN 3.2* 2.5* 2.4* 2.5*   No results for input(s): "LIPASE", "AMYLASE" in the last 168 hours. No results for input(s): "AMMONIA" in the last 168 hours. Coagulation Profile: Recent Labs  Lab 10/05/22 0058  INR 1.1   Cardiac Enzymes: No results for input(s): "CKTOTAL", "CKMB", "CKMBINDEX", "TROPONINI" in the last 168 hours. BNP (last 3 results) No results for input(s): "PROBNP" in the last 8760 hours. HbA1C: No results for input(s): "HGBA1C" in the last 72 hours. CBG: Recent Labs  Lab 10/08/22 0802 10/08/22 1141 10/08/22 1621 10/08/22 2104 10/09/22 0614  GLUCAP 141* 287* 118* 113* 143*   Lipid Profile: No results for input(s): "CHOL", "HDL", "LDLCALC", "TRIG", "CHOLHDL", "LDLDIRECT" in the last 72 hours. Thyroid Function Tests: No results for input(s): "TSH", "T4TOTAL", "FREET4", "T3FREE", "THYROIDAB" in the last 72 hours. Anemia Panel: No results for input(s): "VITAMINB12", "FOLATE", "FERRITIN", "TIBC", "IRON", "RETICCTPCT" in the last 72 hours. Sepsis Labs: Recent Labs  Lab 10/04/22 1626 10/04/22 1819 10/05/22 0058  PROCALCITON  --   --  0.75  LATICACIDVEN 2.9* 1.5  --     Recent Results (from the past 240 hour(s))  Culture, blood (routine x 2)     Status: None   Collection Time: 10/04/22  4:10 PM   Specimen: BLOOD  Result Value Ref Range Status   Specimen Description BLOOD RIGHT ANTECUBITAL  Final   Special Requests   Final    BOTTLES DRAWN AEROBIC AND ANAEROBIC Blood Culture results may not be optimal due  to an excessive volume of blood received in culture bottles   Culture   Final    NO GROWTH 5 DAYS Performed at [facility], [address] [place], [place] [address]    Report Status 10/09/2022 FINAL  Final  Culture, blood (routine x 2)     Status: None   Collection Time: 10/04/22  7:40 PM   Specimen: BLOOD RIGHT HAND  Result Value Ref Range Status   Specimen Description BLOOD RIGHT HAND  Final   Special Requests   Final    BOTTLES DRAWN AEROBIC ONLY Blood Culture adequate volume   Culture   Final    NO GROWTH 5 DAYS Performed at [facility], [address] [place], [place] [address]    Report Status 10/09/2022 FINAL  Final  MRSA Next Gen by PCR, Nasal     Status: None   Collection Time: 10/05/22  5:34 AM   Specimen: Nasal Mucosa; Nasal Swab  Result Value Ref Range Status   MRSA by PCR Next Gen NOT DETECTED NOT DETECTED Final    Comment: (NOTE) The GeneXpert MRSA Assay (FDA approved for NASAL specimens only), is one component of a comprehensive MRSA colonization surveillance program. It is not intended to diagnose MRSA infection nor to guide or monitor treatment for MRSA infections. Test performance is not FDA approved in patients less than 85 years old. Performed at [facility], [address], [place], [place] [address]          Radiology Studies: No results found.         LOS: 5 days   Time spent= 35 mins    Gowri Suchan Joline Maxcy, MD [facility]  If 7PM-7AM, please contact night-coverage  10/09/2022, 7:58 AM

## 2022-10-10 ENCOUNTER — Other Ambulatory Visit (HOSPITAL_COMMUNITY): Payer: Self-pay

## 2022-10-10 DIAGNOSIS — Z8679 Personal history of other diseases of the circulatory system: Secondary | ICD-10-CM | POA: Diagnosis not present

## 2022-10-10 DIAGNOSIS — K6289 Other specified diseases of anus and rectum: Secondary | ICD-10-CM | POA: Diagnosis not present

## 2022-10-10 DIAGNOSIS — J690 Pneumonitis due to inhalation of food and vomit: Secondary | ICD-10-CM | POA: Diagnosis not present

## 2022-10-10 DIAGNOSIS — J189 Pneumonia, unspecified organism: Secondary | ICD-10-CM | POA: Diagnosis not present

## 2022-10-10 LAB — BASIC METABOLIC PANEL
Anion gap: 8 (ref 5–15)
BUN: 6 mg/dL — ABNORMAL LOW (ref 8–23)
CO2: 21 mmol/L — ABNORMAL LOW (ref 22–32)
Calcium: 9.4 mg/dL (ref 8.9–10.3)
Chloride: 110 mmol/L (ref 98–111)
Creatinine, Ser: 0.63 mg/dL (ref 0.44–1.00)
GFR, Estimated: >60 mL/min (ref >60)
Glucose, Bld: 126 mg/dL — ABNORMAL HIGH (ref 70–99)
Potassium: 3.8 mmol/L (ref 3.5–5.1)
Sodium: 139 mmol/L (ref 135–145)

## 2022-10-10 LAB — CBC
HCT: 33 % — ABNORMAL LOW (ref 36.0–46.0)
Hemoglobin: 10.7 g/dL — ABNORMAL LOW (ref 12.0–15.0)
MCH: 25.8 pg — ABNORMAL LOW (ref 26.0–34.0)
MCHC: 32.4 g/dL (ref 30.0–36.0)
MCV: 79.7 fL — ABNORMAL LOW (ref 80.0–100.0)
Platelets: 298 10*3/uL (ref 150–400)
RBC: 4.14 MIL/uL (ref 3.87–5.11)
RDW: 15.9 % — ABNORMAL HIGH (ref 11.5–15.5)
WBC: 7.3 10*3/uL (ref 4.0–10.5)
nRBC: 0 % (ref 0.0–0.2)

## 2022-10-10 LAB — GLUCOSE, CAPILLARY
Glucose-Capillary: 134 mg/dL — ABNORMAL HIGH (ref 70–99)
Glucose-Capillary: 169 mg/dL — ABNORMAL HIGH (ref 70–99)
Glucose-Capillary: 116 mg/dL — ABNORMAL HIGH (ref 70–99)
Glucose-Capillary: 127 mg/dL — ABNORMAL HIGH (ref 70–99)

## 2022-10-10 LAB — MAGNESIUM: Magnesium: 1.9 mg/dL (ref 1.7–2.4)

## 2022-10-10 MED ORDER — AMOXICILLIN-POT CLAVULANATE 875-125 MG PO TABS
1.0000 | ORAL_TABLET | Freq: Two times a day (BID) | ORAL | Status: AC
  Administered 2022-10-10 – 2022-10-14 (×10): 1 via ORAL
  Filled 2022-10-10 (×10): qty 1

## 2022-10-10 MED ORDER — MORPHINE SULFATE (PF) 2 MG/ML IV SOLN
4.0000 mg | INTRAVENOUS | Status: DC | PRN
  Administered 2022-10-10 – 2022-10-11 (×3): 4 mg via INTRAVENOUS
  Filled 2022-10-10 (×3): qty 2

## 2022-10-10 NOTE — Progress Notes (Signed)
No vaginal bleeding noted the whole shift.

## 2022-10-10 NOTE — Progress Notes (Signed)
Physical Therapy Treatment Patient Details Name: [name] MRN: [id number] DOB: 28-Jul-1960 Today's Date: 10/10/2022   History of Present Illness Pt is a 62 y.o. female who was admitted on 6/4 with severe sepsis due to community-acquired pneumonia after presenting with tachycardia which was noted during outpatient GI visit. CT chest 6/4: Bilateral lower lobe ground-glass and patchy lung opacities with bronchial thickening concerning for bilateral lower lobe pneumonia. PMH: type 2 diabetes mellitus, essential hypertension, hyperlipidemia, multiple strokes, GS 1988 with full recovery, NHL in remission    PT Comments    Patient with significant rectal pain, yet very much wanted to work with PT. She asked not to get to the chair to sit up due to the pain. Worked with pt on rolling, supine to sit, sit to stand, and return to bed. All requires +2 mod-max assist.     Recommendations for follow up therapy are one component of a multi-disciplinary discharge planning process, led by the attending physician.  Recommendations may be updated based on patient status, additional functional criteria and insurance authorization.  Follow Up Recommendations  Can patient physically be transported by private vehicle: No    Assistance Recommended at Discharge Frequent or constant Supervision/Assistance  Patient can return home with the following Two people to help with walking and/or transfers;Two people to help with bathing/dressing/bathroom   Equipment Recommendations  None recommended by PT    Recommendations for Other Services       Precautions / Restrictions Precautions Precautions: Fall Precaution Comments: bowel/bladder incontinence, 2L O2 spo2 93-96% Restrictions Weight Bearing Restrictions: No     Mobility  Bed Mobility Overal bed mobility: Needs Assistance Bed Mobility: Rolling, Supine to Sit, Sit to Sidelying Rolling: Max assist   Supine to sit: Max assist, +2 for physical  assistance, HOB elevated   Sit to sidelying: Max assist, +2 for physical assistance General bed mobility comments: rolling rt and lt with pericare prior to mobility due to incontinence of bowels; rolls rt with max assist, left mod; pt wanted to exit bed to her left with HOB elevated, assisted with moving RLE over EOB and reaching for rail with RUE    Transfers Overall transfer level: Needs assistance Equipment used:  (BEd pad) Transfers: Sit to/from Stand Sit to Stand: Mod assist, +2 physical assistance           General transfer comment: pt has been working on stand-pivot at SNF; she did not want to get into chair due to significant rectal pain, therefore tried without stedy but with use of bedpad under pelvis    Ambulation/Gait               General Gait Details: not able to at baseline   Stairs             Wheelchair Mobility    Modified Rankin (Stroke Patients Only)       Balance Overall balance assessment: Needs assistance Sitting-balance support: Feet supported, Single extremity supported Sitting balance-Leahy Scale: Poor (requires outside support to maintain EoB sitting)     Standing balance support: Bilateral upper extremity supported, During functional activity Standing balance-Leahy Scale: Zero Standing balance comment: max +2                            Cognition Arousal/Alertness: Awake/alert Behavior During Therapy:  (crying due to pain) Overall Cognitive Status: History of cognitive impairments - at baseline Area of Impairment: Attention, Following commands, Memory, Safety/judgement, Awareness, Problem  solving                   Current Attention Level: Sustained Memory: Decreased short-term memory, Decreased recall of precautions Following Commands: Follows one step commands with increased time Safety/Judgement: Decreased awareness of safety, Decreased awareness of deficits Awareness: Intellectual Problem Solving: Slow  processing, Decreased initiation, Requires verbal cues, Difficulty sequencing General Comments: follows 1 step commands consistently, needs extra time        Exercises      General Comments General comments (skin integrity, edema, etc.): Despite pain, she was thankful to therapist for coming to work with her.      Pertinent Vitals/Pain Pain Assessment Pain Assessment: 0-10 Pain Score: 10-Worst pain ever Faces Pain Scale: Hurts worst Pain Location: rectum Pain Descriptors / Indicators: Discomfort, Grimacing, Crying Pain Intervention(s): Limited activity within patient's tolerance, Monitored during session, Repositioned (pt wanted to do therapy despite significant pain)    Home Living                          Prior Function            PT Goals (current goals can now be found in the care plan section) Acute Rehab PT Goals Patient Stated Goal: get back to [facility] PT Goal Formulation: With patient Time For Goal Achievement: 10/24/22 Potential to Achieve Goals: Good Progress towards PT goals: Progressing toward goals (Goals extended due to timeframe met today)    Frequency    Min 2X/week      PT Plan Current plan remains appropriate    Co-evaluation              AM-PAC PT "6 Clicks" Mobility   Outcome Measure  Help needed turning from your back to your side while in a flat bed without using bedrails?: A Lot Help needed moving from lying on your back to sitting on the side of a flat bed without using bedrails?: Total Help needed moving to and from a bed to a chair (including a wheelchair)?: Total Help needed standing up from a chair using your arms (e.g., wheelchair or bedside chair)?: Total Help needed to walk in hospital room?: Total Help needed climbing 3-5 steps with a railing? : Total 6 Click Score: 7    End of Session Equipment Utilized During Treatment: Gait belt Activity Tolerance: Patient limited by fatigue;Patient limited by  pain Patient left: with call bell/phone within reach;in bed;with bed alarm set Nurse Communication: Mobility status;Need for lift equipment PT Visit Diagnosis: Other abnormalities of gait and mobility (R26.89);Muscle weakness (generalized) (M62.81);Hemiplegia and hemiparesis Hemiplegia - Right/Left: Left Hemiplegia - dominant/non-dominant: Non-dominant Hemiplegia - caused by: Cerebral infarction     Time: 8657-8469 PT Time Calculation (min) (ACUTE ONLY): 35 min  Charges:  $Therapeutic Activity: 23-37 mins                      Jerolyn Center, PT Acute Rehabilitation Services  Office [phone]    Zena Amos 10/10/2022, 3:08 PM

## 2022-10-10 NOTE — Progress Notes (Addendum)
Attending physician's note   I have taken a history, reviewed the chart, and examined the patient. I performed a substantive portion of this encounter, including complete performance of at least one of the key components, in conjunction with the APP. I agree with the APP's note, impression, and recommendations with my edits.   Continued rectal pain despite manual disimpaction yesterday and Canasa suppositories.  Clinically improving and should hopefully be able to undergo flexible sigmoidoscopy in the coming days pending pulmonary status.  She is amenable to enemas now.  Continue mesalamine, MiraLAX.  Continuing ABX.  Holding Brilinta.  Lisa Ruble, DO, FACG [phone] office          Progress Note  Primary GI: Dr. Lavon Paganini  LOS: 6 days   Chief Complaint:rectal pain/proctitis   Subjective   No family was present at the time of my evaluation.  Patient reports continued persistent rectal pain. Denies nausea, vomiting, or abdominal pain. Rectal pain is worse with bowel movements or sitting on her bottom for too long.  Dr. Chales Abrahams did rectal exam yesterday which showed impaction. Patient was manually disimpacted with no obvious masses, some blood on hard stool balls. Per Dr. Urban Gibson note patient would prefer to avoid enemas.  Today patient states she is okay to receive enemas.   Objective   Vital signs in last 24 hours: Temp:  [97.6 F (36.4 C)-98.4 F (36.9 C)] 98.1 F (36.7 C) (06/10 0734) Pulse Rate:  [95-107] 95 (06/10 0734) Resp:  [14-22] 14 (06/10 0734) BP: (137-162)/(86-98) 142/98 (06/10 0734) SpO2:  [91 %-98 %] 98 % (06/10 0734) Weight:  [66.9 kg] 66.9 kg (06/10 0500) Last BM Date : 10/09/22 Last BM recorded by nurses in past 5 days Stool Type: Type 6 (Mushy consistency with ragged edges); Type 2 (Lump and sausage like) (10/09/2022  3:04 PM)  General:   female in no acute distress  Heart:  Regular rate and rhythm; no murmurs Pulm: Clear anteriorly; no  wheezing, on O2 via [place] Abdomen: soft, nondistended, normal bowel sounds in all quadrants. Nontender without guarding. No organomegaly appreciated. Neurologic:  Alert and  oriented x4;  No focal deficits.  Psych:  Cooperative. Normal mood and affect.  Intake/Output from previous day: 06/09 0701 - 06/10 0700 In: 829.3 [P.O.:220; IV Piggyback:609.3] Out: -  Intake/Output this shift: Total I/O In: 120 [P.O.:120] Out: -   Studies/Results: No results found.  Lab Results: Recent Labs    10/08/22 0022 10/09/22 0015 10/10/22 0024  WBC 5.0 7.4 7.3  HGB 9.3* 10.1* 10.7*  HCT 28.7* 31.6* 33.0*  PLT 251 290 298   BMET Recent Labs    10/09/22 0015 10/10/22 0024  NA 141 139  K 3.1* 3.8  CL 110 110  CO2 22 21*  GLUCOSE 128* 126*  BUN 6* 6*  CREATININE 0.66 0.63  CALCIUM 9.3 9.4   LFT Recent Labs    10/09/22 0015  PROT 6.0*  ALBUMIN 2.5*  AST 23  ALT 47*  ALKPHOS 89  BILITOT 0.4   PT/INR No results for input(s): "LABPROT", "INR" in the last 72 hours.   Scheduled Meds:  amLODipine  10 mg Oral Daily   amoxicillin-clavulanate  1 tablet Oral Q12H   aspirin EC  81 mg Oral Daily   atorvastatin  80 mg Oral Daily   Chlorhexidine Gluconate Cloth  6 each Topical Daily   ezetimibe  10 mg Oral Daily   FLUoxetine  20 mg Oral Daily   insulin  aspart  0-9 Units Subcutaneous TID WC   mesalamine  1,000 mg Rectal BID   multivitamin with minerals  1 tablet Oral Daily   pantoprazole  40 mg Oral Daily   polyethylene glycol  17 g Oral Daily   tiZANidine  2 mg Oral BID   Continuous Infusions:    Patient profile:   [name] is a 62 y.o. 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  07/08/2022 colonoscopy due to rectal bleeding and anemia on Brilinta shows 3 mm polyp ascending, 3 mm polyp descending, polypoid changes sigmoid colon, tortuous colon, poor anal tone difficult to insufflate left colon/rectum, few ulcers in distal rectum, internal hemorrhoids Pathology showed active proctitis/no evidence of chronicity, granuloma, dysplasia or malignancy.  Negative CMV   Impression:   Proctitis - CT ab/pelvis showed new inferior rectal wall thickening and inflammation with new perirectal lymph nodes. Findings are worrisome for proctitis. Neoplasm not excluded. Increased diffuse retroperitoneal, mesenteric, pelvic sidewall, inguinal, and periportal lymphadenopathy. -empiric Rocephin, azithromycin and Flagyl - hgb 10.7 - WBC 7.3, improved - Potassium 3.8 Patient having persistent rectal pain and CT showing continued proctitis despite canasa suppositories and disimpaction. Will likely need repeat flex sig for further evaluation. Appears patient is more stable at this time and can likely undergo procedure in the next few days.   Aspiration pneumonia -empiric Rocephin, azithromycin and Flagyl  History of multiple CVAs - on brillanta (currently on hold - last dose prior to admission 6/4)   Plan:   - continue mesalamine suppositories - if patient is still agreeable, consider carafate enemas - continue miralax BID - Consider proceeding with flexible sigmoidoscopy, will discuss timing with Dr. Barron Alvine. - Continue holding brillanta for possible procedure  [name]  10/10/2022, 10:36 AM

## 2022-10-10 NOTE — Progress Notes (Signed)
PROGRESS NOTE    [name]  XBM:[id number] DOB: 1960/06/26 DOA: 10/04/2022 PCP: [place], [name], MD   Brief Narrative:  62 year old with history of DM2, HTN, HLD, CVA with residual left-sided weakness comes to the ED with complaints of shortness of breath.  Patient was recently hospitalized for aspiration pneumonia/proctitis completed a course of Unasyn and was discharged on mesalamine suppository.  When seen by outpatient [facility] gastroenterology patient was noted to be tachycardic therefore sent to the ER.  CTA chest was negative for PE but did show bilateral multifocal opacity.  CT of the abdomen pelvis was negative.  Patient admitted for sepsis due to recurrent aspiration pneumonia/proctitis on empiric Rocephin, azithromycin and Flagyl.  This was eventually transitioned to Rocephin and then Augmentin.   Assessment & Plan:  Principal Problem:   CAP (community acquired pneumonia) Active Problems:   Essential hypertension   DM2 (diabetes mellitus, type 2) (HCC)   HLD (hyperlipidemia)   Severe sepsis (HCC)   Dehydration   Acute prerenal azotemia   Proctitis   Hypercalcemia   Transaminitis   Pyuria   Depression   Protein-calorie malnutrition, severe     Severe sepsis secondary to recurrent aspiration pneumonia Mild urinary tract infection with possible Pyelonephritis.  Initially showing signs of sepsis but now they are improving.  CTA showed bilateral multifocal opacities.  Procalcitonin 0.7, BNP negative. - Will trend patient to Augmentin, total 5 days - Cultures remain negative.  Legionella/strep-negative -Speech and swallow-regular diet, nectar thick   Bilateral hilar lymphadenopathy Suspect from underlying infection.  This is seen on the CT scan.  Needs to follow-up outpatient and have a repeat scan to ensure this is nothing else concerning   Proctitis: With significant rectal pain Routinely follows with outpatient [facility].  Currently GI is recommending mesalamine twice daily  and MiraLAX twice daily.  Depending on her symptoms, considering flex sig microscopy once her pneumonia has improved   Hypokalemia/hypocalcemia Replete as needed   Elevated liver enzymes: Overall stable   Type 2 diabetes: Holding Trulicity.  Sliding scale and Accu-Cheks   Essential hypertension Currently on Norvasc.  IV as needed   Hyperlipidemia: Resumed statin as LFTs improved.   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   CVA with left-sided weakness: Aspirin and Lipitor   Vaginal bleeding: Seen by GYN.  GC / CT/ trichomoniasis / BV / Candida vaginal swabs-pending   DVT prophylaxis: SCDs Code Status:Full code Family Communication: (No family at bed side) Disposition Plan:     Status is: Inpatient Anticipate discharge back to SNF once her rectal symptoms have improved        Diet Orders (From admission, onward)     Start     Ordered   10/06/22 1305  Diet regular Room service appropriate? Yes with Assist; Fluid consistency: Nectar Thick  Diet effective now       Question Answer Comment  Room service appropriate? Yes with Assist   Fluid consistency: Nectar Thick      10/06/22 1304            Subjective:  Still having rectal pain especially when she coughs.   Examination: Constitutional: Not in acute distress Respiratory: mild b/l rhonchi Cardiovascular: Normal sinus rhythm, no rubs Abdomen: Nontender nondistended good bowel sounds Musculoskeletal: No edema noted Skin: No rashes seen Neurologic: CN 2-12 grossly intact.  And nonfocal Psychiatric: Normal judgment and insight. Alert and oriented x 3. Normal mood.     Objective: Vitals:   10/09/22 2305 10/10/22 0309 10/10/22  0500 10/10/22 0734  BP: 137/86 (!) 143/93  (!) 142/98  Pulse: (!) 107 (!) 103  95  Resp: 17 20  14   Temp: 98.1 F (36.7 C) 98 F (36.7 C)  98.1 F (36.7 C)  TempSrc: Oral Oral  Oral  SpO2: 95% 94%  98%  Weight:   66.9 kg   Height:        Intake/Output Summary (Last 24  hours) at 10/10/2022 0739 Last data filed at 10/09/2022 1721 Gross per 24 hour  Intake 829.32 ml  Output --  Net 829.32 ml   Filed Weights   10/08/22 0500 10/09/22 0310 10/10/22 0500  Weight: 66.7 kg 67.1 kg 66.9 kg    Scheduled Meds:  amLODipine  10 mg Oral Daily   aspirin EC  81 mg Oral Daily   atorvastatin  80 mg Oral Daily   Chlorhexidine Gluconate Cloth  6 each Topical Daily   ezetimibe  10 mg Oral Daily   FLUoxetine  20 mg Oral Daily   insulin aspart  0-9 Units Subcutaneous TID WC   mesalamine  1,000 mg Rectal BID   multivitamin with minerals  1 tablet Oral Daily   pantoprazole  40 mg Oral Daily   polyethylene glycol  17 g Oral Daily   tiZANidine  2 mg Oral BID   Continuous Infusions:  cefTRIAXone (ROCEPHIN)  IV Stopped (10/09/22 1717)    Nutritional status Signs/Symptoms: percent weight loss, moderate muscle depletion, severe muscle depletion Percent weight loss: 22.3 % Interventions: Magic cup, MVI, Refer to RD note for recommendations Body mass index is 25.32 kg/m.  Data Reviewed:   CBC: Recent Labs  Lab 10/05/22 0058 10/06/22 0034 10/07/22 0028 10/08/22 0022 10/09/22 0015 10/10/22 0024  WBC 11.0* 7.9 7.4 5.0 7.4 7.3  NEUTROABS 6.8  --   --   --   --   --   HGB 10.0* 9.5* 9.8* 9.3* 10.1* 10.7*  HCT 30.8* 28.8* 29.7* 28.7* 31.6* 33.0*  MCV 78.6* 79.8* 79.0* 77.2* 78.2* 79.7*  PLT 301 250 253 251 290 298   Basic Metabolic Panel: Recent Labs  Lab 10/05/22 0058 10/06/22 0034 10/07/22 0028 10/09/22 0015 10/10/22 0024  NA 138 138 138 141 139  K 4.3 3.5 3.4* 3.1* 3.8  CL 107 107 107 110 110  CO2 24 22 23 22  21*  GLUCOSE 168* 110* 154* 128* 126*  BUN 15 14 8  6* 6*  CREATININE 0.63 0.72 0.73 0.66 0.63  CALCIUM 9.3 9.2 9.2 9.3 9.4  MG 2.0 1.9 1.8 1.9 1.9  PHOS 3.5 3.4 3.2 2.9  --    GFR: Estimated Creatinine Clearance: 67.7 mL/min (by C-G formula based on SCr of 0.63 mg/dL). Liver Function Tests: Recent Labs  Lab 10/04/22 1621 10/05/22 0058  10/07/22 0028 10/09/22 0015  AST 94* 62* 28 23  ALT 180* 129* 69* 47*  ALKPHOS 152* 111 96 89  BILITOT 0.5 0.4 0.5 0.4  PROT 8.1 6.2* 6.1* 6.0*  ALBUMIN 3.2* 2.5* 2.4* 2.5*   No results for input(s): "LIPASE", "AMYLASE" in the last 168 hours. No results for input(s): "AMMONIA" in the last 168 hours. Coagulation Profile: Recent Labs  Lab 10/05/22 0058  INR 1.1   Cardiac Enzymes: No results for input(s): "CKTOTAL", "CKMB", "CKMBINDEX", "TROPONINI" in the last 168 hours. BNP (last 3 results) No results for input(s): "PROBNP" in the last 8760 hours. HbA1C: No results for input(s): "HGBA1C" in the last 72 hours. CBG: Recent Labs  Lab 10/09/22 [phone] 10/09/22 1143 10/09/22  1632 10/09/22 2103 10/10/22 0559  GLUCAP 143* 115* 137* 116* 134*   Lipid Profile: No results for input(s): "CHOL", "HDL", "LDLCALC", "TRIG", "CHOLHDL", "LDLDIRECT" in the last 72 hours. Thyroid Function Tests: No results for input(s): "TSH", "T4TOTAL", "FREET4", "T3FREE", "THYROIDAB" in the last 72 hours. Anemia Panel: No results for input(s): "VITAMINB12", "FOLATE", "FERRITIN", "TIBC", "IRON", "RETICCTPCT" in the last 72 hours. Sepsis Labs: Recent Labs  Lab 10/04/22 1626 10/04/22 1819 10/05/22 0058  PROCALCITON  --   --  0.75  LATICACIDVEN 2.9* 1.5  --     Recent Results (from the past 240 hour(s))  Culture, blood (routine x 2)     Status: None   Collection Time: 10/04/22  4:10 PM   Specimen: BLOOD  Result Value Ref Range Status   Specimen Description BLOOD RIGHT ANTECUBITAL  Final   Special Requests   Final    BOTTLES DRAWN AEROBIC AND ANAEROBIC Blood Culture results may not be optimal due to an excessive volume of blood received in culture bottles   Culture   Final    NO GROWTH 5 DAYS Performed at [facility], [address], [place], [place] [address]    Report Status 10/09/2022 FINAL  Final  Culture, blood (routine x 2)     Status: None   Collection Time: 10/04/22  7:40 PM    Specimen: BLOOD RIGHT HAND  Result Value Ref Range Status   Specimen Description BLOOD RIGHT HAND  Final   Special Requests   Final    BOTTLES DRAWN AEROBIC ONLY Blood Culture adequate volume   Culture   Final    NO GROWTH 5 DAYS Performed at [facility], [address], [place], [place] [address]    Report Status 10/09/2022 FINAL  Final  MRSA Next Gen by PCR, Nasal     Status: None   Collection Time: 10/05/22  5:34 AM   Specimen: Nasal Mucosa; Nasal Swab  Result Value Ref Range Status   MRSA by PCR Next Gen NOT DETECTED NOT DETECTED Final    Comment: (NOTE) The GeneXpert MRSA Assay (FDA approved for NASAL specimens only), is one component of a comprehensive MRSA colonization surveillance program. It is not intended to diagnose MRSA infection nor to guide or monitor treatment for MRSA infections. Test performance is not FDA approved in patients less than 69 years old. Performed at [facility], [address], [place], [place] [address]          Radiology Studies: No results found.         LOS: 6 days   Time spent= 35 mins    Reighlyn Elmes Joline Maxcy, MD [facility]  If 7PM-7AM, please contact night-coverage  10/10/2022, 7:39 AM

## 2022-10-11 ENCOUNTER — Inpatient Hospital Stay (HOSPITAL_COMMUNITY): Payer: Commercial Managed Care - HMO

## 2022-10-11 DIAGNOSIS — J690 Pneumonitis due to inhalation of food and vomit: Secondary | ICD-10-CM | POA: Diagnosis not present

## 2022-10-11 DIAGNOSIS — J189 Pneumonia, unspecified organism: Secondary | ICD-10-CM | POA: Diagnosis not present

## 2022-10-11 DIAGNOSIS — K6289 Other specified diseases of anus and rectum: Secondary | ICD-10-CM | POA: Diagnosis not present

## 2022-10-11 DIAGNOSIS — Z8679 Personal history of other diseases of the circulatory system: Secondary | ICD-10-CM | POA: Diagnosis not present

## 2022-10-11 LAB — CBC
HCT: 33 % — ABNORMAL LOW (ref 36.0–46.0)
Hemoglobin: 10.6 g/dL — ABNORMAL LOW (ref 12.0–15.0)
MCH: 25.1 pg — ABNORMAL LOW (ref 26.0–34.0)
MCHC: 32.1 g/dL (ref 30.0–36.0)
MCV: 78.2 fL — ABNORMAL LOW (ref 80.0–100.0)
Platelets: 316 10*3/uL (ref 150–400)
RBC: 4.22 MIL/uL (ref 3.87–5.11)
RDW: 16 % — ABNORMAL HIGH (ref 11.5–15.5)
WBC: 9.2 10*3/uL (ref 4.0–10.5)
nRBC: 0 % (ref 0.0–0.2)

## 2022-10-11 LAB — BASIC METABOLIC PANEL
Anion gap: 9 (ref 5–15)
BUN: 10 mg/dL (ref 8–23)
CO2: 21 mmol/L — ABNORMAL LOW (ref 22–32)
Calcium: 9.7 mg/dL (ref 8.9–10.3)
Chloride: 111 mmol/L (ref 98–111)
Creatinine, Ser: 0.77 mg/dL (ref 0.44–1.00)
GFR, Estimated: >60 mL/min (ref >60)
Glucose, Bld: 119 mg/dL — ABNORMAL HIGH (ref 70–99)
Potassium: 3.8 mmol/L (ref 3.5–5.1)
Sodium: 141 mmol/L (ref 135–145)

## 2022-10-11 LAB — GLUCOSE, CAPILLARY
Glucose-Capillary: 123 mg/dL — ABNORMAL HIGH (ref 70–99)
Glucose-Capillary: 162 mg/dL — ABNORMAL HIGH (ref 70–99)
Glucose-Capillary: 126 mg/dL — ABNORMAL HIGH (ref 70–99)
Glucose-Capillary: 158 mg/dL — ABNORMAL HIGH (ref 70–99)

## 2022-10-11 LAB — BRAIN NATRIURETIC PEPTIDE: B Natriuretic Peptide: 49 pg/mL (ref 0.0–100.0)

## 2022-10-11 LAB — MAGNESIUM: Magnesium: 2.1 mg/dL (ref 1.7–2.4)

## 2022-10-11 LAB — PROCALCITONIN: Procalcitonin: 0.62 ng/mL

## 2022-10-11 MED ORDER — SODIUM CHLORIDE 0.9 % IV SOLN: INTRAVENOUS | Status: DC

## 2022-10-11 MED ORDER — IPRATROPIUM-ALBUTEROL 0.5-2.5 (3) MG/3ML IN SOLN
3.0000 mL | Freq: Two times a day (BID) | RESPIRATORY_TRACT | Status: DC
  Administered 2022-10-11 – 2022-10-13 (×5): 3 mL via RESPIRATORY_TRACT
  Filled 2022-10-11 (×5): qty 3

## 2022-10-11 MED ORDER — IPRATROPIUM-ALBUTEROL 0.5-2.5 (3) MG/3ML IN SOLN: 3.0000 mL | Freq: Two times a day (BID) | RESPIRATORY_TRACT | Status: DC

## 2022-10-11 NOTE — Progress Notes (Signed)
PROGRESS NOTE    [name]  QMV:[id number] DOB: 14-Oct-1960 DOA: 10/04/2022 PCP: [place], [name], MD   Brief Narrative:  62 year old with history of DM2, HTN, HLD, CVA with residual left-sided weakness comes to the ED with complaints of shortness of breath.  Patient was recently hospitalized for aspiration pneumonia/proctitis completed a course of Unasyn and was discharged on mesalamine suppository.  When seen by outpatient [facility] gastroenterology patient was noted to be tachycardic therefore sent to the ER.  CTA chest was negative for PE but did show bilateral multifocal opacity.  CT of the abdomen pelvis was negative.  Patient admitted for sepsis due to recurrent aspiration pneumonia/proctitis on empiric Rocephin, azithromycin and Flagyl.  This was eventually transitioned to Rocephin and now on Augmentin. Tentative plans for Flex Sig by GI once Resp symptoms improve.    Assessment & Plan:  Principal Problem:   CAP (community acquired pneumonia) Active Problems:   Essential hypertension   DM2 (diabetes mellitus, type 2) (HCC)   HLD (hyperlipidemia)   Severe sepsis (HCC)   Dehydration   Acute prerenal azotemia   Proctitis   Hypercalcemia   Transaminitis   Pyuria   Depression   Protein-calorie malnutrition, severe     Severe sepsis secondary to recurrent aspiration pneumonia Mild urinary tract infection with possible Pyelonephritis.  Initially showing signs of sepsis but now they are improving.  CTA showed bilateral multifocal opacities.  Procalcitonin 0.7, BNP negative.  Chest x-ray shows bronchopneumonia with atelectasis. - Will trend patient to Augmentin, total 5 days - Cultures remain negative.  Legionella/strep-negative -Speech and swallow-regular diet, nectar thick.  -She really needs to get out of bed to chair, use IS/Flutter. Nebs BID and prn.    Bilateral hilar lymphadenopathy Suspect from underlying infection.  This is seen on the CT scan.  Needs to follow-up outpatient  and have a repeat scan to ensure this is nothing else concerning   Proctitis: With significant rectal pain Routinely follows with outpatient [facility].  Currently GI is recommending mesalamine twice daily and MiraLAX twice daily.  Minimal improvement in her symptoms, will need Flex sig once resp symptoms are more stable.    Hypokalemia/hypocalcemia Replete as needed   Elevated liver enzymes: Overall stable   Type 2 diabetes: Holding Trulicity.  Sliding scale and Accu-Cheks   Essential hypertension Currently on Norvasc.  IV as needed   Hyperlipidemia: Resumed statin as LFTs improved.   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   CVA with left-sided weakness: Aspirin and Lipitor   Vaginal bleeding: Seen by GYN.  GC / CT/ trichomoniasis / BV / Candida vaginal swabs-pending   DVT prophylaxis: SCDs Code Status:Full code Family Communication: (No family at bed side) Disposition Plan:     Status is: Inpatient Anticipate discharge back to SNF once her rectal symptoms have improved        Diet Orders (From admission, onward)     Start     Ordered   10/06/22 1305  Diet regular Room service appropriate? Yes with Assist; Fluid consistency: Nectar Thick  Diet effective now       Question Answer Comment  Room service appropriate? Yes with Assist   Fluid consistency: Nectar Thick      10/06/22 1304            Subjective:  Rectal bleeding improved after IV morphine.  Poor inspiratory effort on incentive spirometer.  Examination: Constitutional: Not in acute distress Respiratory: rhochi at the bases  Cardiovascular: Normal sinus rhythm, no rubs  Abdomen: Nontender nondistended good bowel sounds Musculoskeletal: No edema noted Skin: No rashes seen Neurologic: CN 2-12 grossly intact.  And nonfocal Psychiatric: Normal judgment and insight. Alert and oriented x 3. Normal mood.     Objective: Vitals:   10/10/22 2332 10/11/22 0335 10/11/22 0400 10/11/22 0424  BP: (!)  140/93 (!) 137/95    Pulse: 99 97 99 (!) 101  Resp: 20 14 (!) 22 20  Temp: 97.9 F (36.6 C) 97.6 F (36.4 C)    TempSrc: Oral Oral    SpO2: 95% 95% 93% 94%  Weight:   66.9 kg   Height:        Intake/Output Summary (Last 24 hours) at 10/11/2022 0735 Last data filed at 10/10/2022 1736 Gross per 24 hour  Intake 320 ml  Output --  Net 320 ml   Filed Weights   10/09/22 0310 10/10/22 0500 10/11/22 0400  Weight: 67.1 kg 66.9 kg 66.9 kg    Scheduled Meds:  amLODipine  10 mg Oral Daily   amoxicillin-clavulanate  1 tablet Oral Q12H   aspirin EC  81 mg Oral Daily   atorvastatin  80 mg Oral Daily   Chlorhexidine Gluconate Cloth  6 each Topical Daily   ezetimibe  10 mg Oral Daily   FLUoxetine  20 mg Oral Daily   insulin aspart  0-9 Units Subcutaneous TID WC   mesalamine  1,000 mg Rectal BID   multivitamin with minerals  1 tablet Oral Daily   pantoprazole  40 mg Oral Daily   polyethylene glycol  17 g Oral Daily   tiZANidine  2 mg Oral BID   Continuous Infusions:    Nutritional status Signs/Symptoms: percent weight loss, moderate muscle depletion, severe muscle depletion Percent weight loss: 22.3 % Interventions: Magic cup, MVI, Refer to RD note for recommendations Body mass index is 25.32 kg/m.  Data Reviewed:   CBC: Recent Labs  Lab 10/05/22 0058 10/06/22 0034 10/07/22 0028 10/08/22 0022 10/09/22 0015 10/10/22 0024 10/11/22 0026  WBC 11.0*   < > 7.4 5.0 7.4 7.3 9.2  NEUTROABS 6.8  --   --   --   --   --   --   HGB 10.0*   < > 9.8* 9.3* 10.1* 10.7* 10.6*  HCT 30.8*   < > 29.7* 28.7* 31.6* 33.0* 33.0*  MCV 78.6*   < > 79.0* 77.2* 78.2* 79.7* 78.2*  PLT 301   < > 253 251 290 298 316   < > = values in this interval not displayed.   Basic Metabolic Panel: Recent Labs  Lab 10/05/22 0058 10/06/22 0034 10/07/22 0028 10/09/22 0015 10/10/22 0024 10/11/22 0026  NA 138 138 138 141 139 141  K 4.3 3.5 3.4* 3.1* 3.8 3.8  CL 107 107 107 110 110 111  CO2 24 22 23 22   21* 21*  GLUCOSE 168* 110* 154* 128* 126* 119*  BUN 15 14 8  6* 6* 10  CREATININE 0.63 0.72 0.73 0.66 0.63 0.77  CALCIUM 9.3 9.2 9.2 9.3 9.4 9.7  MG 2.0 1.9 1.8 1.9 1.9 2.1  PHOS 3.5 3.4 3.2 2.9  --   --    GFR: Estimated Creatinine Clearance: 67.7 mL/min (by C-G formula based on SCr of 0.77 mg/dL). Liver Function Tests: Recent Labs  Lab 10/04/22 1621 10/05/22 0058 10/07/22 0028 10/09/22 0015  AST 94* 62* 28 23  ALT 180* 129* 69* 47*  ALKPHOS 152* 111 96 89  BILITOT 0.5 0.4 0.5 0.4  PROT 8.1 6.2*  6.1* 6.0*  ALBUMIN 3.2* 2.5* 2.4* 2.5*   No results for input(s): "LIPASE", "AMYLASE" in the last 168 hours. No results for input(s): "AMMONIA" in the last 168 hours. Coagulation Profile: Recent Labs  Lab 10/05/22 0058  INR 1.1   Cardiac Enzymes: No results for input(s): "CKTOTAL", "CKMB", "CKMBINDEX", "TROPONINI" in the last 168 hours. BNP (last 3 results) No results for input(s): "PROBNP" in the last 8760 hours. HbA1C: No results for input(s): "HGBA1C" in the last 72 hours. CBG: Recent Labs  Lab 10/10/22 0559 10/10/22 1154 10/10/22 1558 10/10/22 2121 10/11/22 0626  GLUCAP 134* 169* 116* 127* 123*   Lipid Profile: No results for input(s): "CHOL", "HDL", "LDLCALC", "TRIG", "CHOLHDL", "LDLDIRECT" in the last 72 hours. Thyroid Function Tests: No results for input(s): "TSH", "T4TOTAL", "FREET4", "T3FREE", "THYROIDAB" in the last 72 hours. Anemia Panel: No results for input(s): "VITAMINB12", "FOLATE", "FERRITIN", "TIBC", "IRON", "RETICCTPCT" in the last 72 hours. Sepsis Labs: Recent Labs  Lab 10/04/22 1626 10/04/22 1819 10/05/22 0058  PROCALCITON  --   --  0.75  LATICACIDVEN 2.9* 1.5  --     Recent Results (from the past 240 hour(s))  Culture, blood (routine x 2)     Status: None   Collection Time: 10/04/22  4:10 PM   Specimen: BLOOD  Result Value Ref Range Status   Specimen Description BLOOD RIGHT ANTECUBITAL  Final   Special Requests   Final    BOTTLES  DRAWN AEROBIC AND ANAEROBIC Blood Culture results may not be optimal due to an excessive volume of blood received in culture bottles   Culture   Final    NO GROWTH 5 DAYS Performed at [facility], [address], [place], [place] [address]    Report Status 10/09/2022 FINAL  Final  Culture, blood (routine x 2)     Status: None   Collection Time: 10/04/22  7:40 PM   Specimen: BLOOD RIGHT HAND  Result Value Ref Range Status   Specimen Description BLOOD RIGHT HAND  Final   Special Requests   Final    BOTTLES DRAWN AEROBIC ONLY Blood Culture adequate volume   Culture   Final    NO GROWTH 5 DAYS Performed at [facility], [address], [place], [place] [address]    Report Status 10/09/2022 FINAL  Final  MRSA Next Gen by PCR, Nasal     Status: None   Collection Time: 10/05/22  5:34 AM   Specimen: Nasal Mucosa; Nasal Swab  Result Value Ref Range Status   MRSA by PCR Next Gen NOT DETECTED NOT DETECTED Final    Comment: (NOTE) The GeneXpert MRSA Assay (FDA approved for NASAL specimens only), is one component of a comprehensive MRSA colonization surveillance program. It is not intended to diagnose MRSA infection nor to guide or monitor treatment for MRSA infections. Test performance is not FDA approved in patients less than 58 years old. Performed at [facility], [address], [place], [place] [address]          Radiology Studies: No results found.         LOS: 7 days   Time spent= 35 mins    Lisa Brodowski Joline Maxcy, MD [facility]  If 7PM-7AM, please contact night-coverage  10/11/2022, 7:35 AM

## 2022-10-11 NOTE — H&P (View-Only) (Signed)
   Attending physician's note   I have taken a history, reviewed the chart, and examined the patient. I performed a substantive portion of this encounter, including complete performance of at least one of the key components, in conjunction with the APP. I agree with the APP's note, impression, and recommendations with my edits.   Pulmonary status seems to be slowly improving.  Still with rectal pain, albeit some mild improvement in the last 24 hours or so.  - Flexible sigmoidoscopy tomorrow - N.p.o. at midnight - Holding Brilinta - Continue ABX for aspiration pneumonia  Lisa Delpriore, DO, FACG [phone] office          Progress Note  Primary GI: Dr. Nandigam  LOS: 7 days   Chief Complaint:rectal pain/proctitis   Subjective   Patient with family at bedside, friend. Provided some of the history.   Patient states her rectal pain has improved since morphine has been given. Sitting up in chair. Tolerating diet without difficulty. Struggling with effort towards incentive spirometry. CXR this morning showed persistent mild bronchopneumonia though patient appears to have improved respiratory status on physical exam   Objective   Vital signs in last 24 hours: Temp:  [97.6 F (36.4 C)-98.1 F (36.7 C)] 97.7 F (36.5 C) (06/11 1024) Pulse Rate:  [94-106] 94 (06/11 1024) Resp:  [14-22] 20 (06/11 1024) BP: (137-152)/(86-96) 152/88 (06/11 1024) SpO2:  [92 %-96 %] 94 % (06/11 1024) Weight:  [66.9 kg] 66.9 kg (06/11 0400) Last BM Date : 10/10/22 Last BM recorded by nurses in past 5 days Stool Type: Type 6 (Mushy consistency with ragged edges) (10/11/2022  4:00 AM)  General:   female in no acute distress  Heart:  Regular rate and rhythm; no murmurs Pulm: Clear anteriorly; no wheezing Abdomen: soft, nondistended, normal bowel sounds in all quadrants. Nontender without guarding. No organomegaly appreciated. Extremities:  No edema Neurologic:  Alert and  oriented x4;  No  focal deficits.  Psych:  Cooperative. Normal mood and affect.  Intake/Output from previous day: 06/10 0701 - 06/11 0700 In: 320 [P.O.:320] Out: -  Intake/Output this shift: Total I/O In: 120 [P.O.:120] Out: -   Studies/Results: DG Chest Port 1 View  Result Date: 10/11/2022 CLINICAL DATA:  Shortness of breath EXAM: PORTABLE CHEST 1 VIEW COMPARISON:  10/04/2022 FINDINGS: Heart size upper limits of normal. Aortic atherosclerotic calcification again seen. The pulmonary vascularity is normal. Mild patchy density in the perihilar regions and lower lobes persists consistent with mild bronchopneumonia. No worsening or progressive finding. No visible effusion. IMPRESSION: Persistent mild patchy density in the perihilar regions and lower lobes consistent with mild bronchopneumonia. Electronically Signed   By: Mark  Shogry M.D.   On: 10/11/2022 11:02    Lab Results: Recent Labs    10/09/22 0015 10/10/22 0024 10/11/22 0026  WBC 7.4 7.3 9.2  HGB 10.1* 10.7* 10.6*  HCT 31.6* 33.0* 33.0*  PLT 290 298 316   BMET Recent Labs    10/09/22 0015 10/10/22 0024 10/11/22 0026  NA 141 139 141  K 3.1* 3.8 3.8  CL 110 110 111  CO2 22 21* 21*  GLUCOSE 128* 126* 119*  BUN 6* 6* 10  CREATININE 0.66 0.63 0.77  CALCIUM 9.3 9.4 9.7   LFT Recent Labs    10/09/22 0015  PROT 6.0*  ALBUMIN 2.5*  AST 23  ALT 47*  ALKPHOS 89  BILITOT 0.4   PT/INR No results for input(s): "LABPROT", "INR" in the last 72 hours.     Scheduled Meds:  amLODipine  10 mg Oral Daily   amoxicillin-clavulanate  1 tablet Oral Q12H   aspirin EC  81 mg Oral Daily   atorvastatin  80 mg Oral Daily   Chlorhexidine Gluconate Cloth  6 each Topical Daily   ezetimibe  10 mg Oral Daily   FLUoxetine  20 mg Oral Daily   insulin aspart  0-9 Units Subcutaneous TID WC   mesalamine  1,000 mg Rectal BID   multivitamin with minerals  1 tablet Oral Daily   pantoprazole  40 mg Oral Daily   polyethylene glycol  17 g Oral Daily    tiZANidine  2 mg Oral BID   Continuous Infusions:    Patient profile:   [name] is a 62 y.o. 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   07/08/2022 colonoscopy due to rectal bleeding and anemia on Brilinta shows 3 mm polyp ascending, 3 mm polyp descending, polypoid changes sigmoid colon, tortuous colon, poor anal tone difficult to insufflate left colon/rectum, few ulcers in distal rectum, internal hemorrhoids Pathology showed active proctitis/no evidence of chronicity, granuloma, dysplasia or malignancy.  Negative CMV   Impression:   Proctitis - CT ab/pelvis showed new inferior rectal wall thickening and inflammation with new perirectal lymph nodes. Findings are worrisome for proctitis. Neoplasm not excluded. Increased diffuse retroperitoneal, mesenteric, pelvic sidewall, inguinal, and periportal lymphadenopathy. -empiric Rocephin, azithromycin and Flagyl - hgb 10.6, stable - WBC 9.2 - Potassium 3.8 -CXR persistent mild bronchopneumonia Patient having persistent rectal pain and CT showing continued proctitis despite canasa suppositories and disimpaction. Will likely need repeat flex sig for further evaluation. Appears patient is more stable at this time and can likely undergo procedure possibly tomorrow   Aspiration pneumonia -empiric Rocephin, azithromycin and Flagyl   History of multiple CVAs - on brillanta (currently on hold - last dose prior to admission 6/4)   Plan:   - Flexible Sigmoidoscopy tomorrow - I thoroughly discussed the procedure with the patient (at bedside) to include nature of the procedure, alternatives, benefits, and risks (including but not limited to  bleeding, infection, perforation, anesthesia/cardiac pulmonary complications).  Patient verbalized understanding and gave verbal consent to proceed. - continue mesalamine suppositories - if patient is still agreeable, consider carafate enemas - continue miralax BID - Continue holding brillanta for possible procedure   [name]  10/11/2022, 11:58 AM    

## 2022-10-11 NOTE — Progress Notes (Addendum)
Attending physician's note   I have taken a history, reviewed the chart, and examined the patient. I performed a substantive portion of this encounter, including complete performance of at least one of the key components, in conjunction with the APP. I agree with the APP's note, impression, and recommendations with my edits.   Pulmonary status seems to be slowly improving.  Still with rectal pain, albeit some mild improvement in the last 24 hours or so.  - Flexible sigmoidoscopy tomorrow - N.p.o. at midnight - Holding Brilinta - Continue ABX for aspiration pneumonia  Angelino Rumery, DO, FACG [phone] office          Progress Note  Primary GI: Dr. Lavon Paganini  LOS: 7 days   Chief Complaint:rectal pain/proctitis   Subjective   Patient with family at bedside, friend. Provided some of the history.   Patient states her rectal pain has improved since morphine has been given. Sitting up in chair. Tolerating diet without difficulty. Struggling with effort towards incentive spirometry. CXR this morning showed persistent mild bronchopneumonia though patient appears to have improved respiratory status on physical exam   Objective   Vital signs in last 24 hours: Temp:  [97.6 F (36.4 C)-98.1 F (36.7 C)] 97.7 F (36.5 C) (06/11 1024) Pulse Rate:  [94-106] 94 (06/11 1024) Resp:  [14-22] 20 (06/11 1024) BP: (137-152)/(86-96) 152/88 (06/11 1024) SpO2:  [92 %-96 %] 94 % (06/11 1024) Weight:  [66.9 kg] 66.9 kg (06/11 0400) Last BM Date : 10/10/22 Last BM recorded by nurses in past 5 days Stool Type: Type 6 (Mushy consistency with ragged edges) (10/11/2022  4:00 AM)  General:   female in no acute distress  Heart:  Regular rate and rhythm; no murmurs Pulm: Clear anteriorly; no wheezing Abdomen: soft, nondistended, normal bowel sounds in all quadrants. Nontender without guarding. No organomegaly appreciated. Extremities:  No edema Neurologic:  Alert and  oriented x4;  No  focal deficits.  Psych:  Cooperative. Normal mood and affect.  Intake/Output from previous day: 06/10 0701 - 06/11 0700 In: 320 [P.O.:320] Out: -  Intake/Output this shift: Total I/O In: 120 [P.O.:120] Out: -   Studies/Results: DG Chest Port 1 View  Result Date: 10/11/2022 CLINICAL DATA:  Shortness of breath EXAM: PORTABLE CHEST 1 VIEW COMPARISON:  10/04/2022 FINDINGS: Heart size upper limits of normal. Aortic atherosclerotic calcification again seen. The pulmonary vascularity is normal. Mild patchy density in the perihilar regions and lower lobes persists consistent with mild bronchopneumonia. No worsening or progressive finding. No visible effusion. IMPRESSION: Persistent mild patchy density in the perihilar regions and lower lobes consistent with mild bronchopneumonia. Electronically Signed   By: Paulina Fusi M.D.   On: 10/11/2022 11:02    Lab Results: Recent Labs    10/09/22 0015 10/10/22 0024 10/11/22 0026  WBC 7.4 7.3 9.2  HGB 10.1* 10.7* 10.6*  HCT 31.6* 33.0* 33.0*  PLT 290 298 316   BMET Recent Labs    10/09/22 0015 10/10/22 0024 10/11/22 0026  NA 141 139 141  K 3.1* 3.8 3.8  CL 110 110 111  CO2 22 21* 21*  GLUCOSE 128* 126* 119*  BUN 6* 6* 10  CREATININE 0.66 0.63 0.77  CALCIUM 9.3 9.4 9.7   LFT Recent Labs    10/09/22 0015  PROT 6.0*  ALBUMIN 2.5*  AST 23  ALT 47*  ALKPHOS 89  BILITOT 0.4   PT/INR No results for input(s): "LABPROT", "INR" in the last 72 hours.  Scheduled Meds:  amLODipine  10 mg Oral Daily   amoxicillin-clavulanate  1 tablet Oral Q12H   aspirin EC  81 mg Oral Daily   atorvastatin  80 mg Oral Daily   Chlorhexidine Gluconate Cloth  6 each Topical Daily   ezetimibe  10 mg Oral Daily   FLUoxetine  20 mg Oral Daily   insulin aspart  0-9 Units Subcutaneous TID WC   mesalamine  1,000 mg Rectal BID   multivitamin with minerals  1 tablet Oral Daily   pantoprazole  40 mg Oral Daily   polyethylene glycol  17 g Oral Daily    tiZANidine  2 mg Oral BID   Continuous Infusions:    Patient profile:   [name] is a 62 y.o. 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   07/08/2022 colonoscopy due to rectal bleeding and anemia on Brilinta shows 3 mm polyp ascending, 3 mm polyp descending, polypoid changes sigmoid colon, tortuous colon, poor anal tone difficult to insufflate left colon/rectum, few ulcers in distal rectum, internal hemorrhoids Pathology showed active proctitis/no evidence of chronicity, granuloma, dysplasia or malignancy.  Negative CMV   Impression:   Proctitis - CT ab/pelvis showed new inferior rectal wall thickening and inflammation with new perirectal lymph nodes. Findings are worrisome for proctitis. Neoplasm not excluded. Increased diffuse retroperitoneal, mesenteric, pelvic sidewall, inguinal, and periportal lymphadenopathy. -empiric Rocephin, azithromycin and Flagyl - hgb 10.6, stable - WBC 9.2 - Potassium 3.8 -CXR persistent mild bronchopneumonia Patient having persistent rectal pain and CT showing continued proctitis despite canasa suppositories and disimpaction. Will likely need repeat flex sig for further evaluation. Appears patient is more stable at this time and can likely undergo procedure possibly tomorrow   Aspiration pneumonia -empiric Rocephin, azithromycin and Flagyl   History of multiple CVAs - on brillanta (currently on hold - last dose prior to admission 6/4)   Plan:   - Flexible Sigmoidoscopy tomorrow - I thoroughly discussed the procedure with the patient (at bedside) to include nature of the procedure, alternatives, benefits, and risks (including but not limited to  bleeding, infection, perforation, anesthesia/cardiac pulmonary complications).  Patient verbalized understanding and gave verbal consent to proceed. - continue mesalamine suppositories - if patient is still agreeable, consider carafate enemas - continue miralax BID - Continue holding brillanta for possible procedure   [name]  10/11/2022, 11:58 AM

## 2022-10-11 NOTE — TOC Initial Note (Signed)
Transition of Care [facility]) - Initial/Assessment Note    Patient Details  Name: [name] MRN: [id number] Date of Birth: [birth year]  Transition of Care [facility] CM/SW Contact:    Leander Rams, LCSW Phone Number: 10/11/2022, 2:23 PM  Clinical Narrative:                 CSW met with pt at bedside to discuss return to SNF. Pt is agreeable to go back to [facility] to get stronger. [name] has confirmed pt can return.   CSW will continue to follow to assist with dc planning.   Expected Discharge Plan: Skilled Nursing Facility Barriers to Discharge: Continued Medical Work up   Patient Goals and CMS Choice Patient states their goals for this hospitalization and ongoing recovery are:: Go back to [facility]          Expected Discharge Plan and Services                                              Prior Living Arrangements/Services   Lives with:: Self, Siblings Patient language and need for interpreter reviewed:: Yes Do you feel safe going back to the place where you live?: Yes      Need for Family Participation in Patient Care: Yes (Comment) Care giver support system in place?: Yes (comment) Current home services: DME Criminal Activity/Legal Involvement Pertinent to Current Situation/Hospitalization: No - Comment as needed  Activities of Daily Living      Permission Sought/Granted      Share Information with NAME: [name]     Permission granted to share info w Relationship: Sister  Permission granted to share info w Contact Information: [phone]  Emotional Assessment Appearance:: Developmentally appropriate Attitude/Demeanor/Rapport: Engaged Affect (typically observed): Pleasant, Accepting Orientation: : Oriented to Self, Oriented to Place, Oriented to  Time, Oriented to Situation Alcohol / Substance Use: Not Applicable Psych Involvement: No (comment)  Admission diagnosis:  Proctitis [K62.89] CAP (community acquired pneumonia)  [J18.9] Pneumonia due to infectious organism, unspecified laterality, unspecified part of lung [J18.9] Patient Active Problem List   Diagnosis Date Noted   Protein-calorie malnutrition, severe 10/08/2022   Severe sepsis (HCC) 10/05/2022   Dehydration 10/05/2022   Acute prerenal azotemia 10/05/2022   Proctitis 10/05/2022   Hypercalcemia 10/05/2022   Transaminitis 10/05/2022   Pyuria 10/05/2022   Depression 10/05/2022   CAP (community acquired pneumonia) 10/04/2022   Malnutrition of moderate degree 09/26/2022   SOB (shortness of breath) 09/25/2022   Nausea vomiting and diarrhea 09/24/2022   Shortness of breath 09/24/2022   Acute respiratory failure with hypoxia (HCC) 09/24/2022   LFT elevation 09/24/2022   Diabetes mellitus (HCC) 09/06/2022   Rectal ulcer 07/09/2022   Colon ulcer 07/08/2022   Rectal bleeding 07/07/2022   Anemia 07/07/2022   GI bleed 07/06/2022   Dysarthria 06/12/2022   Aspiration pneumonia (HCC) 06/12/2022   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   Anxiety disorder due to medical condition 06/10/2022   AKI (acute kidney injury) (HCC) 06/01/2022   Dizziness 05/29/2022   Personal history of CLL (chronic lymphocytic leukemia) 05/29/2022   Abdominal pain 05/29/2022   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 disease, with long-term current use of insulin (HCC) 04/14/2022   Type 2 diabetes mellitus with diabetic polyneuropathy, with long-term current use of insulin (HCC) 04/14/2022   Antiplatelet or antithrombotic long-term use 03/16/2022  HLD (hyperlipidemia) 10/01/2021   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HCC) 09/17/2021   Stage 3a chronic kidney disease (CKD) (HCC) 09/16/2021   Thyroid nodule 09/16/2021   Cervical lymphadenopathy 09/16/2021   Acute CVA (cerebrovascular accident) (HCC) 09/15/2021   GERD (gastroesophageal reflux disease) 04/30/2021   Peripheral arterial disease (HCC) 04/13/2021   Atherosclerosis of aorta (HCC) 02/14/2021   Cerebral  thrombosis with cerebral infarction 02/08/2021   Abnormal MRI of head 02/07/2021   Vertigo 02/07/2021   Class 1 obesity due to excess calories with body mass index (BMI) of 31.0 to 31.9 in adult 02/07/2021   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   Trigger finger, left ring finger 10/04/2018   Alternating constipation and diarrhea 04/20/2018   Diabetic peripheral neuropathy associated with type 2 diabetes mellitus (HCC) 02/26/2018   DM2 (diabetes mellitus, type 2) (HCC) 07/29/2015   ABNORMALITY OF GAIT 05/10/2010   ECZEMA, ATOPIC 04/03/2009   Hyperlipidemia associated with type 2 diabetes mellitus (HCC) 03/06/2009   BACK PAIN 11/14/2008   Hemiparesis affecting left side as late effect of stroke (HCC) 08/05/2008   DIPLOPIA 07/15/2008   Essential hypertension 07/15/2008   PCP:  [place], [name], MD Pharmacy:   [facility] - [place], [place] - [address] [place] [address] Phone: [phone] Fax: [phone]     Social Determinants of Health (SDOH) Social History: SDOH Screenings   Food Insecurity: No Food Insecurity (09/24/2022)  Housing: Low Risk  (09/24/2022)  Transportation Needs: No Transportation Needs (09/24/2022)  Utilities: Not At Risk (09/24/2022)  Depression (PHQ2-9): Low Risk  (03/16/2022)  Tobacco Use: Low Risk  (10/05/2022)   SDOH Interventions:     Readmission Risk Interventions     No data to display          Oletta Lamas, MSW, LCSWA, LCASA Transitions of Care  Clinical Social Worker I

## 2022-10-11 NOTE — Plan of Care (Signed)
  Problem: Health Behavior/Discharge Planning: Goal: Ability to manage health-related needs will improve Outcome: Progressing   Problem: Clinical Measurements: Goal: Ability to maintain clinical measurements within normal limits will improve Outcome: Progressing   Problem: Elimination: Goal: Will not experience complications related to bowel motility Outcome: Progressing Goal: Will not experience complications related to urinary retention Outcome: Progressing   Problem: Pain Managment: Goal: General experience of comfort will improve Outcome: Progressing   Problem: Skin Integrity: Goal: Risk for impaired skin integrity will decrease Outcome: Progressing

## 2022-10-11 NOTE — Progress Notes (Signed)
Mobility Specialist Progress Note:    10/11/22 1136  Mobility  Activity Transferred from bed to chair  Level of Assistance +2 (takes two people)  Assistive Device Stedy  Activity Response Tolerated well  Mobility Referral Yes  $Mobility charge 1 Mobility  Mobility Specialist Start Time (ACUTE ONLY) 1029  Mobility Specialist Stop Time (ACUTE ONLY) 1042  Mobility Specialist Time Calculation (min) (ACUTE ONLY) 13 min   Pt received in bed, agreeable to transfer to the chair. Pt required MaxA for bed mobility and ModA for STS. Pt transferred on 3L/min, VSS. No c/o throughout session. Pt has call bell in hand and chair alarm is on.   Pre Mobility Hr 94 SPO2 94% Post Mobility Hr 99 SPO2 91%  Thompson Grayer Mobility Specialist  Please contact vis Secure Chat or  Rehab Office [phone]

## 2022-10-11 NOTE — Progress Notes (Signed)
Large incontinent UOP this shift. Multiple BM's. No vaginal bleeding noted.

## 2022-10-12 ENCOUNTER — Encounter (HOSPITAL_COMMUNITY)
Admission: EM | Disposition: A | Discharge status: Skilled Nursing Facility | Payer: Self-pay | Source: Home / Self Care | Attending: Internal Medicine

## 2022-10-12 ENCOUNTER — Inpatient Hospital Stay (HOSPITAL_COMMUNITY): Payer: Commercial Managed Care - HMO | Admitting: Registered Nurse

## 2022-10-12 ENCOUNTER — Encounter (HOSPITAL_COMMUNITY): Payer: Self-pay | Admitting: Internal Medicine

## 2022-10-12 ENCOUNTER — Other Ambulatory Visit: Payer: Self-pay

## 2022-10-12 DIAGNOSIS — K6289 Other specified diseases of anus and rectum: Secondary | ICD-10-CM | POA: Diagnosis not present

## 2022-10-12 DIAGNOSIS — Z7984 Long term (current) use of oral hypoglycemic drugs: Secondary | ICD-10-CM

## 2022-10-12 DIAGNOSIS — K529 Noninfective gastroenteritis and colitis, unspecified: Secondary | ICD-10-CM

## 2022-10-12 DIAGNOSIS — E1165 Type 2 diabetes mellitus with hyperglycemia: Secondary | ICD-10-CM

## 2022-10-12 DIAGNOSIS — I10 Essential (primary) hypertension: Secondary | ICD-10-CM | POA: Diagnosis not present

## 2022-10-12 DIAGNOSIS — K644 Residual hemorrhoidal skin tags: Secondary | ICD-10-CM

## 2022-10-12 DIAGNOSIS — Z794 Long term (current) use of insulin: Secondary | ICD-10-CM

## 2022-10-12 DIAGNOSIS — K5130 Ulcerative (chronic) rectosigmoiditis without complications: Secondary | ICD-10-CM | POA: Diagnosis not present

## 2022-10-12 DIAGNOSIS — J189 Pneumonia, unspecified organism: Secondary | ICD-10-CM | POA: Diagnosis not present

## 2022-10-12 HISTORY — PX: BIOPSY: SHX5522

## 2022-10-12 HISTORY — PX: FLEXIBLE SIGMOIDOSCOPY: SHX5431

## 2022-10-12 LAB — BASIC METABOLIC PANEL
Anion gap: 8 (ref 5–15)
BUN: 12 mg/dL (ref 8–23)
CO2: 21 mmol/L — ABNORMAL LOW (ref 22–32)
Calcium: 9.4 mg/dL (ref 8.9–10.3)
Chloride: 112 mmol/L — ABNORMAL HIGH (ref 98–111)
Creatinine, Ser: 0.82 mg/dL (ref 0.44–1.00)
GFR, Estimated: >60 mL/min (ref >60)
Glucose, Bld: 146 mg/dL — ABNORMAL HIGH (ref 70–99)
Potassium: 3.4 mmol/L — ABNORMAL LOW (ref 3.5–5.1)
Sodium: 141 mmol/L (ref 135–145)

## 2022-10-12 LAB — GLUCOSE, CAPILLARY
Glucose-Capillary: 133 mg/dL — ABNORMAL HIGH (ref 70–99)
Glucose-Capillary: 109 mg/dL — ABNORMAL HIGH (ref 70–99)
Glucose-Capillary: 124 mg/dL — ABNORMAL HIGH (ref 70–99)
Glucose-Capillary: 210 mg/dL — ABNORMAL HIGH (ref 70–99)
Glucose-Capillary: 135 mg/dL — ABNORMAL HIGH (ref 70–99)

## 2022-10-12 LAB — CBC
HCT: 30.9 % — ABNORMAL LOW (ref 36.0–46.0)
Hemoglobin: 9.8 g/dL — ABNORMAL LOW (ref 12.0–15.0)
MCH: 25 pg — ABNORMAL LOW (ref 26.0–34.0)
MCHC: 31.7 g/dL (ref 30.0–36.0)
MCV: 78.8 fL — ABNORMAL LOW (ref 80.0–100.0)
Platelets: 290 10*3/uL (ref 150–400)
RBC: 3.92 MIL/uL (ref 3.87–5.11)
RDW: 16 % — ABNORMAL HIGH (ref 11.5–15.5)
WBC: 7.2 10*3/uL (ref 4.0–10.5)
nRBC: 0 % (ref 0.0–0.2)

## 2022-10-12 LAB — MAGNESIUM: Magnesium: 2 mg/dL (ref 1.7–2.4)

## 2022-10-12 SURGERY — SIGMOIDOSCOPY, FLEXIBLE
Anesthesia: Monitor Anesthesia Care

## 2022-10-12 MED ORDER — MORPHINE SULFATE (PF) 2 MG/ML IV SOLN
2.0000 mg | INTRAVENOUS | Status: DC | PRN
  Administered 2022-10-12 – 2022-10-13 (×2): 2 mg via INTRAVENOUS
  Filled 2022-10-12 (×2): qty 1

## 2022-10-12 MED ORDER — PROPOFOL 500 MG/50ML IV EMUL
INTRAVENOUS | Status: DC | PRN
  Administered 2022-10-12: 75 ug/kg/min via INTRAVENOUS

## 2022-10-12 MED ORDER — POTASSIUM CHLORIDE 10 MEQ/100ML IV SOLN
10.0000 meq | INTRAVENOUS | Status: AC
  Administered 2022-10-12 (×4): 10 meq via INTRAVENOUS
  Filled 2022-10-12 (×2): qty 100

## 2022-10-12 MED ORDER — PROPOFOL 10 MG/ML IV BOLUS
INTRAVENOUS | Status: DC | PRN
  Administered 2022-10-12: 30 mg via INTRAVENOUS

## 2022-10-12 MED ORDER — LIDOCAINE 2% (20 MG/ML) 5 ML SYRINGE
INTRAMUSCULAR | Status: DC | PRN
  Administered 2022-10-12: 100 mg via INTRAVENOUS

## 2022-10-12 MED ORDER — LACTATED RINGERS IV SOLN: INTRAVENOUS | Status: DC

## 2022-10-12 MED ORDER — CYCLOBENZAPRINE HCL 10 MG PO TABS
5.0000 mg | ORAL_TABLET | Freq: Three times a day (TID) | ORAL | Status: DC | PRN
  Administered 2022-10-12 – 2022-10-17 (×4): 5 mg via ORAL
  Filled 2022-10-12 (×4): qty 1

## 2022-10-12 NOTE — Interval H&P Note (Signed)
History and Physical Interval Note:  10/12/2022 10:24 AM  [name]  has presented today for surgery, with the diagnosis of proctitis, rectal pain.  The various methods of treatment have been discussed with the patient and family. After consideration of risks, benefits and other options for treatment, the patient has consented to  Procedure(s): FLEXIBLE SIGMOIDOSCOPY (N/A) as a surgical intervention.  The patient's history has been reviewed, patient examined, no change in status, stable for surgery.  I have reviewed the patient's chart and labs.  Questions were answered to the patient's satisfaction.     Verlin Dike Karlisha Mathena

## 2022-10-12 NOTE — Progress Notes (Signed)
PROGRESS NOTE    [name]  ZOX:[id number] DOB: March 19, 1961 DOA: 10/04/2022 PCP: [place], [name], MD   Brief Narrative:  62 year old with history of DM2, HTN, HLD, CVA with residual left-sided weakness comes to the ED with complaints of shortness of breath.  Patient was recently hospitalized for aspiration pneumonia/proctitis completed a course of Unasyn and was discharged on mesalamine suppository.  When seen by outpatient [facility] gastroenterology patient was noted to be tachycardic therefore sent to the ER.  CTA chest was negative for PE but did show bilateral multifocal opacity.  CT of the abdomen pelvis was negative.  Patient admitted for sepsis due to recurrent aspiration pneumonia/proctitis on empiric Rocephin, azithromycin and Flagyl.  This was eventually transitioned to Rocephin and now on Augmentin. Tentative plans for Flex Sig by GI showed multiple ulcers, biopsy taken   Assessment & Plan:  Principal Problem:   CAP (community acquired pneumonia) Active Problems:   Essential hypertension   DM2 (diabetes mellitus, type 2) (HCC)   HLD (hyperlipidemia)   Severe sepsis (HCC)   Dehydration   Acute prerenal azotemia   Proctitis   Hypercalcemia   Transaminitis   Pyuria   Depression   Protein-calorie malnutrition, severe     Severe sepsis secondary to recurrent aspiration pneumonia Mild urinary tract infection with possible Pyelonephritis.  Initially showing signs of sepsis but now they are improving.  CTA showed bilateral multifocal opacities.  Procalcitonin 0.7, BNP negative.  Chest x-ray shows bronchopneumonia with atelectasis. - Will trend patient to Augmentin, total 5 days (until 6/15) - Cultures remain negative.  Legionella/strep-negative -Speech and swallow-regular diet, nectar thick.  -She really needs to get out of bed to chair, use IS/Flutter. Nebs BID and prn.    Bilateral hilar lymphadenopathy Suspect from underlying infection.  This is seen on the CT scan.  Needs to  follow-up outpatient and have a repeat scan to ensure this is nothing else concerning   Proctitis: With significant rectal pain Multiple rectal ulcers Routinely follows with outpatient [facility].  Currently GI is recommending mesalamine twice daily and MiraLAX twice daily.  Minimal improvement in her symptoms, Flex sig showed multiple ulcers/inflammation, biopsy taken.   Hypokalemia/hypocalcemia Replete as needed   Elevated liver enzymes: Overall stable   Type 2 diabetes: Holding Trulicity.  Sliding scale and Accu-Cheks   Essential hypertension Currently on Norvasc.  IV as needed   Hyperlipidemia: Resumed statin as LFTs improved.   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   CVA with left-sided weakness: Aspirin and Lipitor   Vaginal bleeding: Seen by GYN.  GC / CT/ trichomoniasis / BV / Candida vaginal swabs-pending   DVT prophylaxis: SCDs Code Status:Full code Family Communication: (No family at bed side) Disposition Plan:     Status is: Inpatient Anticipate discharge back to SNF once her rectal symptoms have improved        Diet Orders (From admission, onward)     Start     Ordered   10/12/22 0001  Diet NPO time specified  Diet effective midnight        10/11/22 1245            Subjective: Still having rectal pain and discomfort  Examination: Constitutional: Not in acute distress Respiratory: Bibasilar rhonchi Cardiovascular: Normal sinus rhythm, no rubs Abdomen: Nontender nondistended good bowel sounds Musculoskeletal: No edema noted Skin: No rashes seen Neurologic: CN 2-12 grossly intact.  And nonfocal Psychiatric: Normal judgment and insight. Alert and oriented x 3. Normal mood.  Objective: Vitals:  10/11/22 2016 10/11/22 2305 10/12/22 0311 10/12/22 0326  BP:  (!) 146/81 (!) 143/89   Pulse:  99 94 89  Resp:  18 18 17   Temp:  97.9 F (36.6 C) 97.6 F (36.4 C)   TempSrc:  Oral Oral   SpO2: 94% 91% 97% 97%  Weight:    66.7 kg  Height:         Intake/Output Summary (Last 24 hours) at 10/12/2022 0739 Last data filed at 10/12/2022 0311 Gross per 24 hour  Intake 440.19 ml  Output --  Net 440.19 ml   Filed Weights   10/10/22 0500 10/11/22 0400 10/12/22 0326  Weight: 66.9 kg 66.9 kg 66.7 kg    Scheduled Meds:  amLODipine  10 mg Oral Daily   amoxicillin-clavulanate  1 tablet Oral Q12H   aspirin EC  81 mg Oral Daily   atorvastatin  80 mg Oral Daily   Chlorhexidine Gluconate Cloth  6 each Topical Daily   ezetimibe  10 mg Oral Daily   FLUoxetine  20 mg Oral Daily   insulin aspart  0-9 Units Subcutaneous TID WC   ipratropium-albuterol  3 mL Nebulization BID   mesalamine  1,000 mg Rectal BID   multivitamin with minerals  1 tablet Oral Daily   pantoprazole  40 mg Oral Daily   polyethylene glycol  17 g Oral Daily   tiZANidine  2 mg Oral BID   Continuous Infusions:  sodium chloride 20 mL/hr at 10/11/22 1900   potassium chloride       Nutritional status Signs/Symptoms: percent weight loss, moderate muscle depletion, severe muscle depletion Percent weight loss: 22.3 % Interventions: Magic cup, MVI, Refer to RD note for recommendations Body mass index is 25.24 kg/m.  Data Reviewed:   CBC: Recent Labs  Lab 10/08/22 0022 10/09/22 0015 10/10/22 0024 10/11/22 0026 10/11/22 2355  WBC 5.0 7.4 7.3 9.2 7.2  HGB 9.3* 10.1* 10.7* 10.6* 9.8*  HCT 28.7* 31.6* 33.0* 33.0* 30.9*  MCV 77.2* 78.2* 79.7* 78.2* 78.8*  PLT 251 290 298 316 290   Basic Metabolic Panel: Recent Labs  Lab 10/06/22 0034 10/07/22 0028 10/09/22 0015 10/10/22 0024 10/11/22 0026 10/11/22 2355  NA 138 138 141 139 141 141  K 3.5 3.4* 3.1* 3.8 3.8 3.4*  CL 107 107 110 110 111 112*  CO2 22 23 22  21* 21* 21*  GLUCOSE 110* 154* 128* 126* 119* 146*  BUN 14 8 6* 6* 10 12  CREATININE 0.72 0.73 0.66 0.63 0.77 0.82  CALCIUM 9.2 9.2 9.3 9.4 9.7 9.4  MG 1.9 1.8 1.9 1.9 2.1 2.0  PHOS 3.4 3.2 2.9  --   --   --    GFR: Estimated Creatinine Clearance:  66 mL/min (by C-G formula based on SCr of 0.82 mg/dL). Liver Function Tests: Recent Labs  Lab 10/07/22 0028 10/09/22 0015  AST 28 23  ALT 69* 47*  ALKPHOS 96 89  BILITOT 0.5 0.4  PROT 6.1* 6.0*  ALBUMIN 2.4* 2.5*   No results for input(s): "LIPASE", "AMYLASE" in the last 168 hours. No results for input(s): "AMMONIA" in the last 168 hours. Coagulation Profile: No results for input(s): "INR", "PROTIME" in the last 168 hours.  Cardiac Enzymes: No results for input(s): "CKTOTAL", "CKMB", "CKMBINDEX", "TROPONINI" in the last 168 hours. BNP (last 3 results) No results for input(s): "PROBNP" in the last 8760 hours. HbA1C: No results for input(s): "HGBA1C" in the last 72 hours. CBG: Recent Labs  Lab 10/11/22 0626 10/11/22 1139 10/11/22 1606  10/11/22 2116 10/12/22 0625  GLUCAP 123* 162* 126* 158* 133*   Lipid Profile: No results for input(s): "CHOL", "HDL", "LDLCALC", "TRIG", "CHOLHDL", "LDLDIRECT" in the last 72 hours. Thyroid Function Tests: No results for input(s): "TSH", "T4TOTAL", "FREET4", "T3FREE", "THYROIDAB" in the last 72 hours. Anemia Panel: No results for input(s): "VITAMINB12", "FOLATE", "FERRITIN", "TIBC", "IRON", "RETICCTPCT" in the last 72 hours. Sepsis Labs: Recent Labs  Lab 10/11/22 0026  PROCALCITON 0.62    Recent Results (from the past 240 hour(s))  Culture, blood (routine x 2)     Status: None   Collection Time: 10/04/22  4:10 PM   Specimen: BLOOD  Result Value Ref Range Status   Specimen Description BLOOD RIGHT ANTECUBITAL  Final   Special Requests   Final    BOTTLES DRAWN AEROBIC AND ANAEROBIC Blood Culture results may not be optimal due to an excessive volume of blood received in culture bottles   Culture   Final    NO GROWTH 5 DAYS Performed at [facility], [address], [place], [place] [address]    Report Status 10/09/2022 FINAL  Final  Culture, blood (routine x 2)     Status: None   Collection Time: 10/04/22  7:40 PM    Specimen: BLOOD RIGHT HAND  Result Value Ref Range Status   Specimen Description BLOOD RIGHT HAND  Final   Special Requests   Final    BOTTLES DRAWN AEROBIC ONLY Blood Culture adequate volume   Culture   Final    NO GROWTH 5 DAYS Performed at [facility], [address] [place], [place] [address]    Report Status 10/09/2022 FINAL  Final  MRSA Next Gen by PCR, Nasal     Status: None   Collection Time: 10/05/22  5:34 AM   Specimen: Nasal Mucosa; Nasal Swab  Result Value Ref Range Status   MRSA by PCR Next Gen NOT DETECTED NOT DETECTED Final    Comment: (NOTE) The GeneXpert MRSA Assay (FDA approved for NASAL specimens only), is one component of a comprehensive MRSA colonization surveillance program. It is not intended to diagnose MRSA infection nor to guide or monitor treatment for MRSA infections. Test performance is not FDA approved in patients less than 60 years old. Performed at [facility], [address] [place], [place] [address]          Radiology Studies: DG Chest Port 1 View  Result Date: 10/11/2022 CLINICAL DATA:  Shortness of breath EXAM: PORTABLE CHEST 1 VIEW COMPARISON:  10/04/2022 FINDINGS: Heart size upper limits of normal. Aortic atherosclerotic calcification again seen. The pulmonary vascularity is normal. Mild patchy density in the perihilar regions and lower lobes persists consistent with mild bronchopneumonia. No worsening or progressive finding. No visible effusion. IMPRESSION: Persistent mild patchy density in the perihilar regions and lower lobes consistent with mild bronchopneumonia. Electronically Signed   By: Paulina Fusi M.D.   On: 10/11/2022 11:02           LOS: 8 days   Time spent= 35 mins    Lorean Ekstrand Joline Maxcy, MD [facility]  If 7PM-7AM, please contact night-coverage  10/12/2022, 7:39 AM

## 2022-10-12 NOTE — Anesthesia Preprocedure Evaluation (Signed)
Anesthesia Evaluation  Patient identified by MRN, date of birth, ID band Patient awake    Reviewed: Allergy & Precautions, NPO status , Patient's Chart, lab work & pertinent test results  Airway Mallampati: II  TM Distance: >3 FB Neck ROM: Full    Dental  (+) Dental Advisory Given, Missing, Poor Dentition, Chipped   Pulmonary shortness of breath and with exertion, pneumonia, resolved   breath sounds clear to auscultation       Cardiovascular hypertension, + Peripheral Vascular Disease   Rhythm:Regular Rate:Normal  Echo: 1. Left ventricular ejection fraction, by estimation, is 60 to 65%. The  left ventricle has normal function. The left ventricle has no regional  wall motion abnormalities. There is mild concentric left ventricular  hypertrophy. Left ventricular diastolic  parameters are consistent with Grade I diastolic dysfunction (impaired  relaxation).   2. Right ventricular systolic function is normal. The right ventricular  size is normal. Tricuspid regurgitation signal is inadequate for assessing  PA pressure.   3. The mitral valve is grossly normal. No evidence of mitral valve  regurgitation. No evidence of mitral stenosis.   4. The aortic valve is tricuspid. Aortic valve regurgitation is not  visualized. No aortic stenosis is present.   5. The inferior vena cava is normal in size with greater than 50%  respiratory variability, suggesting right atrial pressure of 3 mmHg.      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  Neuromuscular disease CVA, Residual Symptoms    GI/Hepatic Neg liver ROS, PUD,GERD  Medicated,,Proctitis Rectal pain   Endo/Other  diabetes, Poorly Controlled, Type 2, Insulin Dependent, Oral Hypoglycemic Agents  GLP-1 RA therapy  Renal/GU Renal InsufficiencyRenal disease  negative genitourinary   Musculoskeletal Contracture left  arm/wrist   Abdominal Normal abdominal exam  (+)   Peds  Hematology  (+) Blood dyscrasia, anemia   Anesthesia Other Findings   Reproductive/Obstetrics                              Anesthesia Physical Anesthesia Plan  ASA: 3  Anesthesia Plan: MAC   Post-op Pain Management: Minimal or no pain anticipated   Induction: Intravenous  PONV Risk Score and Plan: 0 and Propofol infusion  Airway Management Planned: Natural Airway and Simple Face Mask  Additional Equipment: None  Intra-op Plan:   Post-operative Plan:   Informed Consent: I have reviewed the patients History and Physical, chart, labs and discussed the procedure including the risks, benefits and alternatives for the proposed anesthesia with the patient or authorized representative who has indicated his/her understanding and acceptance.     Dental advisory given  Plan Discussed with: CRNA  Anesthesia Plan Comments:         Anesthesia Quick Evaluation

## 2022-10-12 NOTE — Progress Notes (Signed)
PT Cancellation Note  Patient Details Name: Shelton Soler MRN: [id number] DOB: 11/10/1960   Cancelled Treatment:    Reason Eval/Treat Not Completed: Patient at procedure or test/unavailable (pt off unit for sigmoidoscopy. will follow up at later date/time as schedule allows and pt able.)   Renaldo Fiddler PT, DPT Acute Rehabilitation Services Office [phone]  10/12/22 10:45 AM

## 2022-10-12 NOTE — Transfer of Care (Signed)
Immediate Anesthesia Transfer of Care Note  Patient: [name]  Procedure(s) Performed: FLEXIBLE SIGMOIDOSCOPY  Patient Location: PACU and Endoscopy Unit  Anesthesia Type:MAC  Level of Consciousness: drowsy and patient cooperative  Airway & Oxygen Therapy: Patient connected to face mask oxygen  Post-op Assessment: Report given to RN and Post -op Vital signs reviewed and stable  Post vital signs: Reviewed and stable  Last Vitals:  Vitals Value Taken Time  BP 141/70   Temp    Pulse    Resp    SpO2 99     Last Pain:  Vitals:   10/12/22 1000  TempSrc: Temporal  PainSc: 0-No pain      Patients Stated Pain Goal: 2 (10/11/22 0944)  Complications: There were no known notable events for this encounter.

## 2022-10-12 NOTE — Op Note (Signed)
[facility] Patient Name: [name] Procedure Date : 10/12/2022 MRN: [id number] Attending MD: Doristine Locks , MD, [id number] Date of Birth: [birth year] CSN: [id number] Age: 62 Admit Type: Inpatient Procedure:                Flexible Sigmoidoscopy w/ biopsy Indications:              Abnormal CT of the GI tract, Rectal pain Providers:                Doristine Locks, MD, Adolph Pollack, RN, Harrington Challenger, Technician Referring MD:              Medicines:                Monitored Anesthesia Care Complications:            No immediate complications. Estimated Blood Loss:     Estimated blood loss was minimal. Procedure:                Pre-Anesthesia Assessment:                           - Prior to the procedure, a History and Physical                            was performed, and patient medications and                            allergies were reviewed. The patient's tolerance of                            previous anesthesia was also reviewed. The risks                            and benefits of the procedure and the sedation                            options and risks were discussed with the patient.                            All questions were answered, and informed consent                            was obtained. Prior Anticoagulants: The patient has                            taken Brilinta (ticagrelor), last dose was 7 days                            prior to procedure. ASA Grade Assessment: III - A                            patient with severe systemic disease. After  reviewing the risks and benefits, the patient was                            deemed in satisfactory condition to undergo the                            procedure.                           After obtaining informed consent, the scope was                            passed under direct vision. The [id number]-[id number] ([id number])                             Olympus endoscope was introduced through the anus                            and advanced to the the descending colon. The                            flexible sigmoidoscopy was accomplished without                            difficulty. The patient tolerated the procedure                            well. The quality of the bowel preparation was                            adequate. Scope In: Scope Out: Findings:      Skin tags were found on perianal exam.      Scattered inflammation characterized by congestion (edema), erythema,       and shallow ulcerations/erosions was found in the rectum, in the       recto-sigmoid colon, in the sigmoid colon and in the distal descending       colon. This appeared to improve at the distal extent reached in the       descending colon. Biopsies were taken throughout with a cold forceps for       histology. Estimated blood loss was minimal.      No additional abnormalities were found on retroflexion. Impression:               - Perianal skin tags found on perianal exam.                           - Scattered inflammation was found in the rectum,                            in the recto-sigmoid colon, in the sigmoid colon                            and in the distal descending colon. Biopsied. Recommendation:           - Return patient to hospital ward for  ongoing care.                           - Advance diet as tolerated today.                           - Continue present medications.                           - Await pathology results.                           - Additional recommendations pending histology                            results. Procedure Code(s):        --- Professional ---                           [phone], Sigmoidoscopy, flexible; with biopsy, single                            or multiple Diagnosis Code(s):        --- Professional ---                           K62.89, Other specified diseases of anus and rectum                            K52.9, Noninfective gastroenteritis and colitis,                            unspecified                           K64.4, Residual hemorrhoidal skin tags                           R93.3, Abnormal findings on diagnostic imaging of                            other parts of digestive tract CPT copyright 2022 [employer]. All rights reserved. The codes documented in this report are preliminary and upon coder review may  be revised to meet current compliance requirements. Doristine Locks, MD 10/12/2022 11:15:06 AM Number of Addenda: 0

## 2022-10-12 NOTE — Anesthesia Postprocedure Evaluation (Signed)
Anesthesia Post Note  Patient: [name]  Procedure(s) Performed: FLEXIBLE SIGMOIDOSCOPY BIOPSY     Patient location during evaluation: PACU Anesthesia Type: MAC Level of consciousness: awake and alert and oriented Pain management: pain level controlled Vital Signs Assessment: post-procedure vital signs reviewed and stable Respiratory status: spontaneous breathing, nonlabored ventilation and respiratory function stable Cardiovascular status: stable and blood pressure returned to baseline Postop Assessment: no apparent nausea or vomiting Anesthetic complications: no   There were no known notable events for this encounter.  Last Vitals:  Vitals:   10/12/22 1110 10/12/22 1120  BP: 131/81 138/79  Pulse: 96 88  Resp: 19 (!) 21  Temp:    SpO2:      Last Pain:  Vitals:   10/12/22 1100  TempSrc: Temporal  PainSc:                  Millena Callins A.

## 2022-10-13 DIAGNOSIS — Z8679 Personal history of other diseases of the circulatory system: Secondary | ICD-10-CM | POA: Diagnosis not present

## 2022-10-13 DIAGNOSIS — J189 Pneumonia, unspecified organism: Secondary | ICD-10-CM | POA: Diagnosis not present

## 2022-10-13 DIAGNOSIS — J690 Pneumonitis due to inhalation of food and vomit: Secondary | ICD-10-CM | POA: Diagnosis not present

## 2022-10-13 DIAGNOSIS — K6289 Other specified diseases of anus and rectum: Secondary | ICD-10-CM | POA: Diagnosis not present

## 2022-10-13 LAB — BASIC METABOLIC PANEL
Anion gap: 8 (ref 5–15)
BUN: 11 mg/dL (ref 8–23)
CO2: 22 mmol/L (ref 22–32)
Calcium: 9.4 mg/dL (ref 8.9–10.3)
Chloride: 112 mmol/L — ABNORMAL HIGH (ref 98–111)
Creatinine, Ser: 0.67 mg/dL (ref 0.44–1.00)
GFR, Estimated: >60 mL/min (ref >60)
Glucose, Bld: 128 mg/dL — ABNORMAL HIGH (ref 70–99)
Potassium: 3.6 mmol/L (ref 3.5–5.1)
Sodium: 142 mmol/L (ref 135–145)

## 2022-10-13 LAB — GLUCOSE, CAPILLARY
Glucose-Capillary: 110 mg/dL — ABNORMAL HIGH (ref 70–99)
Glucose-Capillary: 143 mg/dL — ABNORMAL HIGH (ref 70–99)
Glucose-Capillary: 153 mg/dL — ABNORMAL HIGH (ref 70–99)
Glucose-Capillary: 137 mg/dL — ABNORMAL HIGH (ref 70–99)

## 2022-10-13 LAB — CBC
HCT: 29.9 % — ABNORMAL LOW (ref 36.0–46.0)
Hemoglobin: 9.7 g/dL — ABNORMAL LOW (ref 12.0–15.0)
MCH: 25.7 pg — ABNORMAL LOW (ref 26.0–34.0)
MCHC: 32.4 g/dL (ref 30.0–36.0)
MCV: 79.3 fL — ABNORMAL LOW (ref 80.0–100.0)
Platelets: 293 10*3/uL (ref 150–400)
RBC: 3.77 MIL/uL — ABNORMAL LOW (ref 3.87–5.11)
RDW: 16.1 % — ABNORMAL HIGH (ref 11.5–15.5)
WBC: 7.1 10*3/uL (ref 4.0–10.5)
nRBC: 0 % (ref 0.0–0.2)

## 2022-10-13 LAB — SURGICAL PATHOLOGY

## 2022-10-13 LAB — MAGNESIUM: Magnesium: 1.9 mg/dL (ref 1.7–2.4)

## 2022-10-13 MED ORDER — IPRATROPIUM-ALBUTEROL 0.5-2.5 (3) MG/3ML IN SOLN
3.0000 mL | Freq: Two times a day (BID) | RESPIRATORY_TRACT | Status: DC
  Administered 2022-10-13 – 2022-10-18 (×9): 3 mL via RESPIRATORY_TRACT
  Filled 2022-10-13 (×10): qty 3

## 2022-10-13 NOTE — Progress Notes (Signed)
Physical Therapy Treatment Patient Details Name: [name] MRN: [id number] DOB: 1960/09/18 Today's Date: 10/13/2022   History of Present Illness Pt is a 62 y.o. female who was admitted on 6/4 with severe sepsis due to community-acquired pneumonia after presenting with tachycardia which was noted during outpatient GI visit. CT chest 6/4: Bilateral lower lobe ground-glass and patchy lung opacities with bronchial thickening concerning for bilateral lower lobe pneumonia. +painful rectum/proctitis with sigmoidoscopy and biopsies. PMH: type 2 diabetes mellitus, essential hypertension, hyperlipidemia, multiple strokes, GS 1988 with full recovery, NHL in remission    PT Comments    Patient eager to work with therapies. She reported less rectal pain and requested to work on standing and get up to chair. Patient able to progress to 1 person assist for bed mobility (mod assist). She was able to use tone in her left hand to grasp bar across stedy as working on sit to stand x 4 reps. She progressed from first transfer with +2 mod assist to minguard assist for last rep (standing from stedy flaps). Patient highly motivated and making progress. Continue to recommend post-acute therapy <3 hrs per day.     Recommendations for follow up therapy are one component of a multi-disciplinary discharge planning process, led by the attending physician.  Recommendations may be updated based on patient status, additional functional criteria and insurance authorization.  Follow Up Recommendations  Can patient physically be transported by private vehicle: No    Assistance Recommended at Discharge Frequent or constant Supervision/Assistance  Patient can return home with the following Two people to help with walking and/or transfers;Two people to help with bathing/dressing/bathroom   Equipment Recommendations  None recommended by PT    Recommendations for Other Services       Precautions / Restrictions  Precautions Precautions: Fall Precaution Comments: bowel/bladder incontinence, 2L O2 spo2 93-96% Restrictions Weight Bearing Restrictions: No     Mobility  Bed Mobility Overal bed mobility: Needs Assistance Bed Mobility: Rolling, Sidelying to Sit Rolling: Mod assist (to left with rail) Sidelying to sit: HOB elevated, Mod assist       General bed mobility comments: HOB elevated and use of rail to help push herself up to sitting from left side    Transfers Overall transfer level: Needs assistance   Transfers: Sit to/from Stand, Bed to chair/wheelchair/BSC Sit to Stand: Mod assist, +2 physical assistance, Min guard, From elevated surface           General transfer comment: stand +2 mod (light) from EOB with stedy; from flaps of stedy x 3 reps needing progressively less assist until she stood on final rep with minguard assist Transfer via Lift Equipment: Stedy  Ambulation/Gait               General Gait Details: not able to at baseline   Stairs             Wheelchair Mobility    Modified Rankin (Stroke Patients Only)       Balance Overall balance assessment: Needs assistance Sitting-balance support: Feet supported, Single extremity supported Sitting balance-Leahy Scale: Poor Sitting balance - Comments: initial support due to posterior and left lean; progressed to minguard   Standing balance support: Bilateral upper extremity supported, During functional activity Standing balance-Leahy Scale: Poor Standing balance comment: +2                            Cognition Arousal/Alertness: Awake/alert Behavior During Therapy: WFL for tasks  assessed/performed Overall Cognitive Status: History of cognitive impairments - at baseline Area of Impairment: Attention, Awareness, Problem solving, Following commands                   Current Attention Level: Selective   Following Commands: Follows one step commands with increased time    Awareness: Intellectual Problem Solving: Slow processing, Decreased initiation, Requires verbal cues, Difficulty sequencing          Exercises Other Exercises Other Exercises: left heel cord stretch and donned PRAFO; left hip internal rotation stretch (tends to lie in external rotation)    General Comments General comments (skin integrity, edema, etc.): HR up to 132, sats on 3L, decr to 2L, decr to RA with sats >95%      Pertinent Vitals/Pain Pain Assessment Pain Assessment: Faces Faces Pain Scale: No hurt    Home Living                          Prior Function            PT Goals (current goals can now be found in the care plan section) Acute Rehab PT Goals Patient Stated Goal: get back to [facility] Time For Goal Achievement: 10/24/22 Potential to Achieve Goals: Good Progress towards PT goals: Progressing toward goals    Frequency    Min 2X/week      PT Plan Current plan remains appropriate    Co-evaluation PT/OT/SLP Co-Evaluation/Treatment: Yes Reason for Co-Treatment: Complexity of the patient's impairments (multi-system involvement);For patient/therapist safety;To address functional/ADL transfers PT goals addressed during session: Mobility/safety with mobility;Balance        AM-PAC PT "6 Clicks" Mobility   Outcome Measure  Help needed turning from your back to your side while in a flat bed without using bedrails?: A Lot Help needed moving from lying on your back to sitting on the side of a flat bed without using bedrails?: Total Help needed moving to and from a bed to a chair (including a wheelchair)?: Total Help needed standing up from a chair using your arms (e.g., wheelchair or bedside chair)?: Total Help needed to walk in hospital room?: Total Help needed climbing 3-5 steps with a railing? : Total 6 Click Score: 7    End of Session Equipment Utilized During Treatment: Oxygen Activity Tolerance: Patient limited by fatigue;Patient  limited by pain Patient left: with call bell/phone within reach;in bed;with bed alarm set Nurse Communication: Mobility status;Need for lift equipment;Other (comment) (weaned off oxygen to room air) PT Visit Diagnosis: Other abnormalities of gait and mobility (R26.89);Muscle weakness (generalized) (M62.81);Hemiplegia and hemiparesis Hemiplegia - Right/Left: Left Hemiplegia - dominant/non-dominant: Non-dominant Hemiplegia - caused by: Cerebral infarction     Time: 1610-9604 PT Time Calculation (min) (ACUTE ONLY): 37 min  Charges:  $Gait Training: 8-22 mins                      Jerolyn Center, PT Acute Rehabilitation Services  Office [phone]    Zena Amos 10/13/2022, 1:28 PM

## 2022-10-13 NOTE — Progress Notes (Addendum)
Attending physician's note   I have taken a history, reviewed the chart, and examined the patient. I performed a substantive portion of this encounter, including complete performance of at least one of the key components, in conjunction with the APP. I agree with the APP's note, impression, and recommendations with my edits.   Flexible sigmoidoscopy completed yesterday and notable for scattered ulcers, pathology pending.  Rectal pain improving with mesalamine suppositories.  Tolerating p.o. intake.  Overall feeling better today.  - Will follow-up on pending pathology - Continue current therapy as outlined below  Lisa Barge, DO, FACG [phone] office          Progress Note  Primary GI: Dr. Lavon Paganini  LOS: 9 days   Chief Complaint: Proctitis   Subjective   Patient states her rectal pain is better today.  Denies abdominal pain, nausea, vomiting.  Tolerating diet without difficulty.   Objective   Vital signs in last 24 hours: Temp:  [98 F (36.7 C)-98.1 F (36.7 C)] 98 F (36.7 C) (06/13 0754) Pulse Rate:  [88-98] 89 (06/13 0500) Resp:  [19-21] 21 (06/13 0500) BP: (126-161)/(69-95) 126/69 (06/12 2310) SpO2:  [90 %-96 %] 96 % (06/13 0754) Weight:  [81 kg] 67 kg (06/13 0500) Last BM Date : 10/12/22 Last BM recorded by nurses in past 5 days Stool Type: Type 6 (Mushy consistency with ragged edges) (10/11/2022  4:00 AM)  General:   female in no acute distress  Heart:  Regular rate and rhythm; no murmurs Pulm: Clear anteriorly; no wheezing Abdomen: soft, nondistended, normal bowel sounds in all quadrants. Nontender without guarding. No organomegaly appreciated. Extremities:  No edema Neurologic:  Alert and  oriented x4;  No focal deficits.  Psych:  Cooperative. Normal mood and affect.  Intake/Output from previous day: 06/12 0701 - 06/13 0700 In: 487 [P.O.:237; I.V.:250] Out: -  Intake/Output this shift: Total I/O In: 240 [P.O.:240] Out: -    Studies/Results: No results found.  Lab Results: Recent Labs    10/11/22 0026 10/11/22 2355 10/12/22 2343  WBC 9.2 7.2 7.1  HGB 10.6* 9.8* 9.7*  HCT 33.0* 30.9* 29.9*  PLT 316 290 293   BMET Recent Labs    10/11/22 0026 10/11/22 2355 10/12/22 2343  NA 141 141 142  K 3.8 3.4* 3.6  CL 111 112* 112*  CO2 21* 21* 22  GLUCOSE 119* 146* 128*  BUN 10 12 11   CREATININE 0.77 0.82 0.67  CALCIUM 9.7 9.4 9.4   LFT No results for input(s): "PROT", "ALBUMIN", "AST", "ALT", "ALKPHOS", "BILITOT", "BILIDIR", "IBILI" in the last 72 hours. PT/INR No results for input(s): "LABPROT", "INR" in the last 72 hours.   Scheduled Meds:  amLODipine  10 mg Oral Daily   amoxicillin-clavulanate  1 tablet Oral Q12H   aspirin EC  81 mg Oral Daily   atorvastatin  80 mg Oral Daily   Chlorhexidine Gluconate Cloth  6 each Topical Daily   ezetimibe  10 mg Oral Daily   FLUoxetine  20 mg Oral Daily   insulin aspart  0-9 Units Subcutaneous TID WC   ipratropium-albuterol  3 mL Nebulization BID   mesalamine  1,000 mg Rectal BID   multivitamin with minerals  1 tablet Oral Daily   pantoprazole  40 mg Oral Daily   polyethylene glycol  17 g Oral Daily   tiZANidine  2 mg Oral BID   Continuous Infusions:    Patient profile:   [name] is a 62 y.o. 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   07/08/2022 colonoscopy due to rectal bleeding and anemia on Brilinta shows 3 mm polyp ascending, 3 mm polyp descending, polypoid changes sigmoid colon, tortuous colon, poor anal tone difficult to insufflate left colon/rectum, few ulcers in distal rectum, internal hemorrhoids Pathology showed active  proctitis/no evidence of chronicity, granuloma, dysplasia or malignancy.  Negative CMV    Impression:   Proctitis - CT ab/pelvis showed new inferior rectal wall thickening and inflammation with new perirectal lymph nodes. Findings are worrisome for proctitis. Neoplasm not excluded. Increased diffuse retroperitoneal, mesenteric, pelvic sidewall, inguinal, and periportal lymphadenopathy. - hgb 9.7, stable -CXR persistent mild bronchopneumonia -Sigmoidoscopy 6/12: Perianal skin tags on perianal exam.  Scattered inflammation in rectum, rectosigmoid colon, sigmoid colon, distal descending colon.  Biopsies pending    Aspiration pneumonia -On 5-day course of Augmentin (last dose 6/15) -CXR persistent mild bronchopneumonia   History of multiple CVAs - on brillanta (currently on hold - last dose prior to admission 6/4)    Plan:   -Await biopsy results and manage based on results -Continue supportive care -Continue MiraLAX twice daily -Continue mesalamine suppositories -GI will follow  [name]  10/13/2022, 11:03 AM

## 2022-10-13 NOTE — Progress Notes (Signed)
Speech Language Pathology Treatment: Dysphagia  Patient Details Name: [name] MRN: [id number] DOB: 1960/07/02 Today's Date: 10/13/2022 Time: 6578-4696 SLP Time Calculation (min) (ACUTE ONLY): 12 min  Assessment / Plan / Recommendation Clinical Impression  Pt seen by ST and had MBS 5/26 recommending nectar thick liquids. Pt had a sigmoidoscopy and diet was changed to full liquids, thin. SLP observed pt with both nectar juice and thin. She tolerated the nectar but had immediate cough after first sip thin liquid out of several trials indicative of possible continued airway intrusion. Pt states dislike of thick liquids but agreeable to return to nectar thick. SLP modified liquids to nectar, continue full liquids per MD and SLP recommends repeating MBS tomorrow for possible upgrade.    HPI HPI: Pt is a 62 y.o. female who was admitted on 6/4 with severe sepsis due to community-acquired pneumonia after presenting with tachycardia which was noted during outpatient GI visit. CT chest 6/4: Bilateral lower lobe ground-glass and patchy lung opacities with bronchial thickening concerning for bilateral lower lobe pneumonia. PMH: type 2 diabetes mellitus, essential hypertension, hyperlipidemia, stroke. MBS 5/26: moderate oral dysphagia due to dentition impacting labial seal and resulting in anterior spillage of liquids. Pharyngeal dysphagia with delayed initaition of swallow even when a adequate oral bolus hold is achieved. Pt had sensed and silent penetration before the swallow with subsequent aspiration of penetrate when glottic closure is released. A dysphagia 3 diet with nectar thick liquids was recommended at that time. Pt discharged from hospital 5/28 and was on that diet at that time.      SLP Plan  Continue with current plan of care      Recommendations for follow up therapy are one component of a multi-disciplinary discharge planning process, led by the attending physician.  Recommendations may  be updated based on patient status, additional functional criteria and insurance authorization.    Recommendations  Diet recommendations: Other(comment);Nectar-thick liquid (full liquids rec'd after sigmoidoscopy) Liquids provided via: Straw Medication Administration: Crushed with puree Supervision: Staff to assist with self feeding;Full supervision/cueing for compensatory strategies Compensations: Slow rate;Small sips/bites;Minimize environmental distractions Postural Changes and/or Swallow Maneuvers: Seated upright 90 degrees;Upright 30-60 min after meal                  Oral care BID   Frequent or constant Supervision/Assistance Dysphagia, oropharyngeal phase (R13.12)     Continue with current plan of care     Royce Macadamia  10/13/2022, 10:27 AM

## 2022-10-13 NOTE — Progress Notes (Signed)
Occupational Therapy Treatment Patient Details Name: [name] MRN: [id number] DOB: 1960/09/19 Today's Date: 10/13/2022   History of present illness Pt is a 62 y.o. female who was admitted on 6/4 with severe sepsis due to community-acquired pneumonia after presenting with tachycardia which was noted during outpatient GI visit. CT chest 6/4: Bilateral lower lobe ground-glass and patchy lung opacities with bronchial thickening concerning for bilateral lower lobe pneumonia. +painful rectum/proctitis with sigmoidoscopy and biopsies. PMH: type 2 diabetes mellitus, essential hypertension, hyperlipidemia, multiple strokes, GS 1988 with full recovery, NHL in remission   OT comments  Pt remains highly motivated. Focus of session on bed mobility, sitting balance EOB and sit<>stand from elevated bed and platforms of stedy. Pt requires +2 mod assist for bed mobility, +2 mod to stand from lower surface, +2 min guard from higher surface of stedy. Total assist for pericare. Pt set up to drink once seated in chair. HR to 130 bpm during activity, SpO2 >93% on RA. Positioned L UE on pillow with fingers extended. Pt has a resting hand splint at [facility].   Recommendations for follow up therapy are one component of a multi-disciplinary discharge planning process, led by the attending physician.  Recommendations may be updated based on patient status, additional functional criteria and insurance authorization.    Assistance Recommended at Discharge Frequent or constant Supervision/Assistance  Patient can return home with the following  Two people to help with walking and/or transfers;A lot of help with bathing/dressing/bathroom;Assistance with cooking/housework;Assistance with feeding;Help with stairs or ramp for entrance;Assist for transportation;Direct supervision/assist for financial management;Direct supervision/assist for medications management   Equipment Recommendations  None recommended by OT     Recommendations for Other Services      Precautions / Restrictions Precautions Precautions: Fall Precaution Comments: bowel/bladder incontinence, 2L O2 spo2 93-96% Restrictions Weight Bearing Restrictions: No       Mobility Bed Mobility Overal bed mobility: Needs Assistance Bed Mobility: Rolling, Sidelying to Sit Rolling: Mod assist Sidelying to sit: Mod assist       General bed mobility comments: cues for technique, assist for LEs over EOB and to raise trunk    Transfers Overall transfer level: Needs assistance Equipment used: Ambulation equipment used Transfers: Sit to/from Stand Sit to Stand: Mod assist, +2 physical assistance, Min guard, From elevated surface           General transfer comment: stand +2 mod (light) from EOB with stedy; from flaps of stedy x 3 reps needing progressively less assist until she stood on final rep with minguard assist Transfer via Lift Equipment: Stedy   Balance Overall balance assessment: Needs assistance Sitting-balance support: Feet supported, Single extremity supported Sitting balance-Leahy Scale: Poor Sitting balance - Comments: initial support due to posterior and left lean; progressed to minguard   Standing balance support: Bilateral upper extremity supported, During functional activity Standing balance-Leahy Scale: Poor                             ADL either performed or assessed with clinical judgement   ADL Overall ADL's : Needs assistance/impaired     Grooming: Set up;Sitting                       Toileting- Clothing Manipulation and Hygiene: Total assistance;Sit to/from stand Toileting - Clothing Manipulation Details (indicate cue type and reason): posterior pericare            Extremity/Trunk Assessment Upper Extremity Assessment  Upper Extremity Assessment: LUE deficits/detail LUE Deficits / Details: stretched fingers            Vision       Perception     Praxis       Cognition Arousal/Alertness: Awake/alert Behavior During Therapy: WFL for tasks assessed/performed Overall Cognitive Status: History of cognitive impairments - at baseline                                          Exercises      Shoulder Instructions       General Comments HR up to 132, sats on 3L, decr to 2L, decr to RA with sats >95%    Pertinent Vitals/ Pain       Pain Assessment Pain Assessment: Faces Faces Pain Scale: No hurt  Home Living                                          Prior Functioning/Environment              Frequency  Min 1X/week        Progress Toward Goals  OT Goals(current goals can now be found in the care plan section)  Progress towards OT goals: Progressing toward goals  Acute Rehab OT Goals OT Goal Formulation: With patient Time For Goal Achievement: 10/20/22 Potential to Achieve Goals: Fair  Plan Discharge plan remains appropriate    Co-evaluation    PT/OT/SLP Co-Evaluation/Treatment: Yes Reason for Co-Treatment: Complexity of the patient's impairments (multi-system involvement);For patient/therapist safety PT goals addressed during session: Mobility/safety with mobility;Balance OT goals addressed during session: Strengthening/ROM      AM-PAC OT "6 Clicks" Daily Activity     Outcome Measure   Help from another person eating meals?: A Little Help from another person taking care of personal grooming?: A Lot Help from another person toileting, which includes using toliet, bedpan, or urinal?: Total Help from another person bathing (including washing, rinsing, drying)?: Total Help from another person to put on and taking off regular upper body clothing?: Total Help from another person to put on and taking off regular lower body clothing?: Total 6 Click Score: 9    End of Session    OT Visit Diagnosis: Muscle weakness (generalized) (M62.81);Hemiplegia and hemiparesis;Other symptoms and  signs involving cognitive function;Unsteadiness on feet (R26.81) Hemiplegia - Right/Left: Left Hemiplegia - dominant/non-dominant: Non-Dominant Hemiplegia - caused by: Cerebral infarction   Activity Tolerance Patient tolerated treatment well   Patient Left in chair;with call bell/phone within reach;with chair alarm set   Nurse Communication Other (comment) (O2 >90% on RA)        Time: 1610-9604 OT Time Calculation (min): 36 min  Charges: OT General Charges $OT Visit: 1 Visit OT Treatments $Therapeutic Activity: 8-22 mins  Berna Spare, OTR/L Acute Rehabilitation Services Office: [phone]   Evern Bio 10/13/2022, 2:45 PM

## 2022-10-13 NOTE — Progress Notes (Signed)
PROGRESS NOTE    [name]  ZOX:[id number] DOB: 10-Oct-1960 DOA: 10/04/2022 PCP: [place], [name], MD   Brief Narrative:  Patient is a 62 year old African-American female with past medical history significant for DM2, HTN, HLD, CVA with residual left-sided weakness/hemiparesis.  Patient presented with shortness of breath.  Patient was recently hospitalized for aspiration pneumonia/proctitis completed a course of Unasyn and was discharged on mesalamine suppository.  When seen by outpatient [facility] gastroenterology patient was noted to be tachycardic and patient was therefore sent to the ER.  CTA chest was negative for PE but did show bilateral multifocal opacity.  CT of the abdomen pelvis was negative.  Patient was admitted with sepsis due to recurrent aspiration pneumonia/proctitis on empiric Rocephin, azithromycin and Flagyl.  Antibiotics were initially transitioned to Rocephin, and now on Augmentin.  Patient will complete course of oral Augmentin on 10/15/2022 (as per collateral information).  Patient underwent Flex Sig by GI.  Flexible sigmoidoscopy revealed multiple ulcers involving the rectosigmoid area.  Biopsies were taken and pathology result is still pending.    10/13/2022: Patient seen alongside patient's sister.  Patient has significant dysarthria.  No new complaints.  10 pathology result.  Likely discharge back to the skilled nursing facility when cleared for discharge by the GI team.   Assessment & Plan:  Principal Problem:   CAP (community acquired pneumonia) Active Problems:   Essential hypertension   DM2 (diabetes mellitus, type 2) (HCC)   HLD (hyperlipidemia)   Rectal pain   Severe sepsis (HCC)   Dehydration   Acute prerenal azotemia   Proctitis   Hypercalcemia   Transaminitis   Pyuria   Depression   Protein-calorie malnutrition, severe     Severe sepsis secondary to recurrent aspiration pneumonia Mild urinary tract infection with possible Pyelonephritis.  Initially  showing signs of sepsis but now they are improving.  CTA showed bilateral multifocal opacities.  Procalcitonin 0.7, BNP negative.  Chest x-ray shows bronchopneumonia with atelectasis. - Will trend patient to Augmentin, total 5 days (until 6/15) - Cultures remain negative.  Legionella/strep-negative -Speech and swallow-regular diet, nectar thick.  -She really needs to get out of bed to chair, use IS/Flutter. Nebs BID and prn. 10/13/2022: Sepsis physiology has resolved significantly.   Bilateral hilar lymphadenopathy Suspect from underlying infection.  This is seen on the CT scan.  Needs to follow-up outpatient and have a repeat scan to ensure this is nothing else concerning   Proctitis: With significant rectal pain Multiple rectal ulcers Routinely follows with outpatient [facility].  Currently GI is recommending mesalamine twice daily and MiraLAX twice daily.  Minimal improvement in her symptoms, Flex sig showed multiple ulcers/inflammation, biopsy taken. 10/13/2022: See above documentation.  Patient has undergone flexible sigmoidoscopy.  Pathology result is being awaited.  GI is directing care.  Discharge back to skilled nursing facility once cleared for discharge by the GI team.   Hypokalemia/hypocalcemia Replete as needed 10/30/2022: Last potassium was 3.6.   Elevated liver enzymes: Overall stable   Type 2 diabetes: Holding Trulicity.  Sliding scale and Accu-Cheks   Essential hypertension Currently on Norvasc.  IV as needed   Hyperlipidemia: Resumed statin as LFTs improved.   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   CVA with left-sided weakness: Aspirin and Lipitor   Vaginal bleeding: Seen by GYN.  GC / CT/ trichomoniasis / BV / Candida vaginal swabs-pending   DVT prophylaxis: SCDs Code Status:Full code Family Communication: (No family at bed side) Disposition Plan:     Status is: Inpatient  Anticipate discharge back to SNF once her rectal symptoms have improved         Diet Orders (From admission, onward)     Start     Ordered   10/13/22 0900  Diet full liquid Room service appropriate? No; Fluid consistency: Nectar Thick  Diet effective now       Question Answer Comment  Room service appropriate? No   Fluid consistency: Nectar Thick      10/13/22 0900            Subjective: Still having rectal pain and discomfort  Examination: Constitutional: Chronically ill looking.  Dysarthric.  Not in any distress.  Alert.   Respiratory: Decreased air entry.   Cardiovascular: S1-S2.   Abdomen: Nontender nondistended good bowel sounds Musculoskeletal: No edema noted Neurologic: Left-sided hemiplegia.  Dysarthric.  Awake and alert.   EXTR: No leg edema.  Objective: Vitals:   10/12/22 1940 10/12/22 2310 10/13/22 0500 10/13/22 0754  BP: (!) 161/95 126/69    Pulse: 95 98 89   Resp: 20 20 (!) 21   Temp: 98.1 F (36.7 C) 98 F (36.7 C)  98 F (36.7 C)  TempSrc: Oral Oral  Oral  SpO2: 90% 94% 93% 96%  Weight:   67 kg   Height:        Intake/Output Summary (Last 24 hours) at 10/13/2022 1205 Last data filed at 10/13/2022 0900 Gross per 24 hour  Intake 240 ml  Output --  Net 240 ml    Filed Weights   10/12/22 0326 10/12/22 1000 10/13/22 0500  Weight: 66.7 kg 66.7 kg 67 kg    Scheduled Meds:  amLODipine  10 mg Oral Daily   amoxicillin-clavulanate  1 tablet Oral Q12H   aspirin EC  81 mg Oral Daily   atorvastatin  80 mg Oral Daily   Chlorhexidine Gluconate Cloth  6 each Topical Daily   ezetimibe  10 mg Oral Daily   FLUoxetine  20 mg Oral Daily   insulin aspart  0-9 Units Subcutaneous TID WC   ipratropium-albuterol  3 mL Nebulization BID   mesalamine  1,000 mg Rectal BID   multivitamin with minerals  1 tablet Oral Daily   pantoprazole  40 mg Oral Daily   polyethylene glycol  17 g Oral Daily   tiZANidine  2 mg Oral BID   Continuous Infusions:     Nutritional status Signs/Symptoms: percent weight loss, moderate muscle depletion,  severe muscle depletion Percent weight loss: 22.3 % Interventions: Magic cup, MVI, Refer to RD note for recommendations Body mass index is 25.35 kg/m.  Data Reviewed:   CBC: Recent Labs  Lab 10/09/22 0015 10/10/22 0024 10/11/22 0026 10/11/22 2355 10/12/22 2343  WBC 7.4 7.3 9.2 7.2 7.1  HGB 10.1* 10.7* 10.6* 9.8* 9.7*  HCT 31.6* 33.0* 33.0* 30.9* 29.9*  MCV 78.2* 79.7* 78.2* 78.8* 79.3*  PLT 290 298 316 290 293    Basic Metabolic Panel: Recent Labs  Lab 10/07/22 0028 10/09/22 0015 10/10/22 0024 10/11/22 0026 10/11/22 2355 10/12/22 2343  NA 138 141 139 141 141 142  K 3.4* 3.1* 3.8 3.8 3.4* 3.6  CL 107 110 110 111 112* 112*  CO2 23 22 21* 21* 21* 22  GLUCOSE 154* 128* 126* 119* 146* 128*  BUN 8 6* 6* 10 12 11   CREATININE 0.73 0.66 0.63 0.77 0.82 0.67  CALCIUM 9.2 9.3 9.4 9.7 9.4 9.4  MG 1.8 1.9 1.9 2.1 2.0 1.9  PHOS 3.2 2.9  --   --   --   --  GFR: Estimated Creatinine Clearance: 67.7 mL/min (by C-G formula based on SCr of 0.67 mg/dL). Liver Function Tests: Recent Labs  Lab 10/07/22 0028 10/09/22 0015  AST 28 23  ALT 69* 47*  ALKPHOS 96 89  BILITOT 0.5 0.4  PROT 6.1* 6.0*  ALBUMIN 2.4* 2.5*    No results for input(s): "LIPASE", "AMYLASE" in the last 168 hours. No results for input(s): "AMMONIA" in the last 168 hours. Coagulation Profile: No results for input(s): "INR", "PROTIME" in the last 168 hours.  Cardiac Enzymes: No results for input(s): "CKTOTAL", "CKMB", "CKMBINDEX", "TROPONINI" in the last 168 hours. BNP (last 3 results) No results for input(s): "PROBNP" in the last 8760 hours. HbA1C: No results for input(s): "HGBA1C" in the last 72 hours. CBG: Recent Labs  Lab 10/12/22 1259 10/12/22 1615 10/12/22 2202 10/13/22 0656 10/13/22 1104  GLUCAP 124* 210* 135* 110* 143*    Lipid Profile: No results for input(s): "CHOL", "HDL", "LDLCALC", "TRIG", "CHOLHDL", "LDLDIRECT" in the last 72 hours. Thyroid Function Tests: No results for  input(s): "TSH", "T4TOTAL", "FREET4", "T3FREE", "THYROIDAB" in the last 72 hours. Anemia Panel: No results for input(s): "VITAMINB12", "FOLATE", "FERRITIN", "TIBC", "IRON", "RETICCTPCT" in the last 72 hours. Sepsis Labs: Recent Labs  Lab 10/11/22 0026  PROCALCITON 0.62     Recent Results (from the past 240 hour(s))  Culture, blood (routine x 2)     Status: None   Collection Time: 10/04/22  4:10 PM   Specimen: BLOOD  Result Value Ref Range Status   Specimen Description BLOOD RIGHT ANTECUBITAL  Final   Special Requests   Final    BOTTLES DRAWN AEROBIC AND ANAEROBIC Blood Culture results may not be optimal due to an excessive volume of blood received in culture bottles   Culture   Final    NO GROWTH 5 DAYS Performed at [facility], [address], [place], [place] [address]    Report Status 10/09/2022 FINAL  Final  Culture, blood (routine x 2)     Status: None   Collection Time: 10/04/22  7:40 PM   Specimen: BLOOD RIGHT HAND  Result Value Ref Range Status   Specimen Description BLOOD RIGHT HAND  Final   Special Requests   Final    BOTTLES DRAWN AEROBIC ONLY Blood Culture adequate volume   Culture   Final    NO GROWTH 5 DAYS Performed at [facility], [address] [place], [place] [address]    Report Status 10/09/2022 FINAL  Final  MRSA Next Gen by PCR, Nasal     Status: None   Collection Time: 10/05/22  5:34 AM   Specimen: Nasal Mucosa; Nasal Swab  Result Value Ref Range Status   MRSA by PCR Next Gen NOT DETECTED NOT DETECTED Final    Comment: (NOTE) The GeneXpert MRSA Assay (FDA approved for NASAL specimens only), is one component of a comprehensive MRSA colonization surveillance program. It is not intended to diagnose MRSA infection nor to guide or monitor treatment for MRSA infections. Test performance is not FDA approved in patients less than 5 years old. Performed at [facility], [address], [place], [place] [address]           Radiology Studies: No results found.         LOS: 9 days   Time spent= 35 mins    Barnetta Chapel, MD [facility]  If 7PM-7AM, please contact night-coverage  10/13/2022, 12:05 PM

## 2022-10-13 NOTE — Progress Notes (Signed)
Nutrition Follow-up  DOCUMENTATION CODES:   Severe malnutrition in context of chronic illness  INTERVENTION:   - Continue Mighty Shake TID with meals, each supplement provides 330 kcals and 9 grams of protein  - Continue Magic Cup TID with meals, each supplement provides 290 kcal and 9 grams of protein  - MVI with minerals  NUTRITION DIAGNOSIS:   Severe Malnutrition related to chronic illness (CVA) as evidenced by percent weight loss, moderate muscle depletion, severe muscle depletion.  Ongoing, being addressed via oral nutrition supplements  GOAL:   Patient will meet greater than or equal to 90% of their needs  Progressing  MONITOR:   PO intake, Supplement acceptance, Labs, Weight trends  REASON FOR ASSESSMENT:   Consult Poor PO  ASSESSMENT:   Pt recently admitted d/t aspiration PNA and proctitis. Presents with c/o SOB due to severe sepsis secondary to aspiration PNA and persistent proctitis. PMH significant for T2DM, essential HTN, HLD, CVA with residual L sided weakness.  06/07 - s/p BSE with recommendations for regular diet, nectar thick liquids 06/12 - NPO, s/p flexible sigmoidoscopy showing inflammation (biopsy taken), diet advanced to Carb Modified with nectar-thick liquids then later downgraded to full liquids 06/13 - diet changed to nectar-thick full liquids  Pt unavailable at time of RD visit. Flexible sigmoidoscopy yesterday showing scattered inflammation in rectum, rectosigmoid colon, sigmoid colon, and distal descending colon. Biopsies results are pending. Per MD note, pt able to be discharged back to SNF once cleared for discharge by GI team.  PO intake is variable, averaging 60% for the last few days. Pt with nectar-thick oral nutrition supplements ordered with meal trays. Will continue these at this time.  Admit weight: 65.3 kg Current weight: 67 kg  Meal Completion: 10-100%  Medications reviewed and include: SSI, mesalamine suppository, MVI with  minerals, protonix, miralax  Labs reviewed. CBG's: 110-210 x 24 hours  Diet Order:   Diet Order             Diet full liquid Room service appropriate? No; Fluid consistency: Nectar Thick  Diet effective now                   EDUCATION NEEDS:   Education needs have been addressed  Skin:  Skin Assessment: Reviewed RN Assessment  Last BM:  10/12/22  Height:   Ht Readings from Last 1 Encounters:  10/12/22 5\' 4"  (1.626 m)    Weight:   Wt Readings from Last 1 Encounters:  10/13/22 67 kg    BMI:  Body mass index is 25.35 kg/m.  Estimated Nutritional Needs:   Kcal:  1800-2000  Protein:  90-105g  Fluid:  >/=1.8L    Mertie Clause, MS, RD, LDN Inpatient Clinical Dietitian Please see AMiON for contact information.

## 2022-10-14 ENCOUNTER — Inpatient Hospital Stay (HOSPITAL_COMMUNITY): Payer: Commercial Managed Care - HMO

## 2022-10-14 DIAGNOSIS — Z8679 Personal history of other diseases of the circulatory system: Secondary | ICD-10-CM | POA: Diagnosis not present

## 2022-10-14 DIAGNOSIS — J690 Pneumonitis due to inhalation of food and vomit: Secondary | ICD-10-CM | POA: Diagnosis not present

## 2022-10-14 DIAGNOSIS — J189 Pneumonia, unspecified organism: Secondary | ICD-10-CM | POA: Diagnosis not present

## 2022-10-14 DIAGNOSIS — K6289 Other specified diseases of anus and rectum: Secondary | ICD-10-CM | POA: Diagnosis not present

## 2022-10-14 LAB — CBC
HCT: 32.4 % — ABNORMAL LOW (ref 36.0–46.0)
Hemoglobin: 10.4 g/dL — ABNORMAL LOW (ref 12.0–15.0)
MCH: 25.6 pg — ABNORMAL LOW (ref 26.0–34.0)
MCHC: 32.1 g/dL (ref 30.0–36.0)
MCV: 79.8 fL — ABNORMAL LOW (ref 80.0–100.0)
Platelets: 304 10*3/uL (ref 150–400)
RBC: 4.06 MIL/uL (ref 3.87–5.11)
RDW: 16.1 % — ABNORMAL HIGH (ref 11.5–15.5)
WBC: 9.4 10*3/uL (ref 4.0–10.5)
nRBC: 0 % (ref 0.0–0.2)

## 2022-10-14 LAB — GLUCOSE, CAPILLARY
Glucose-Capillary: 152 mg/dL — ABNORMAL HIGH (ref 70–99)
Glucose-Capillary: 146 mg/dL — ABNORMAL HIGH (ref 70–99)
Glucose-Capillary: 143 mg/dL — ABNORMAL HIGH (ref 70–99)
Glucose-Capillary: 113 mg/dL — ABNORMAL HIGH (ref 70–99)

## 2022-10-14 LAB — BASIC METABOLIC PANEL
Anion gap: 9 (ref 5–15)
BUN: 7 mg/dL — ABNORMAL LOW (ref 8–23)
CO2: 22 mmol/L (ref 22–32)
Calcium: 9.6 mg/dL (ref 8.9–10.3)
Chloride: 111 mmol/L (ref 98–111)
Creatinine, Ser: 0.79 mg/dL (ref 0.44–1.00)
GFR, Estimated: >60 mL/min (ref >60)
Glucose, Bld: 175 mg/dL — ABNORMAL HIGH (ref 70–99)
Potassium: 3.5 mmol/L (ref 3.5–5.1)
Sodium: 142 mmol/L (ref 135–145)

## 2022-10-14 LAB — MAGNESIUM: Magnesium: 1.9 mg/dL (ref 1.7–2.4)

## 2022-10-14 MED ORDER — POTASSIUM CHLORIDE 20 MEQ PO PACK
40.0000 meq | PACK | Freq: Once | ORAL | Status: AC
  Administered 2022-10-14: 40 meq via ORAL
  Filled 2022-10-14: qty 2

## 2022-10-14 NOTE — Progress Notes (Addendum)
Attending physician's note   I have taken a history, reviewed the chart, and examined the patient. I performed a substantive portion of this encounter, including complete performance of at least one of the key components, in conjunction with the APP. I agree with the APP's note, impression, and recommendations with my edits.   Rectal pain continues to improve slowly.  Biopsies from the flexible sigmoidoscopy showed moderate to severe nonspecific proctocolitis with erosion, but no chronic inflammatory changes to suggest IBD.  CMV staining negative.  DDx includes infection or medication-induced.   - Resume mesalamine supp - Inpatient GI service will sign off at this time. Please do not hesitate to contact us w/ additional questions or concerns  Youssouf Shipley, DO, FACG [phone] office          Progress Note  Primary GI: Dr. Lavon Paganini  LOS: 10 days   Chief Complaint: Proctitis    Subjective   Patient states she continues to have rectal pain with bowel movements, though it is somewhat improved than previously. Denies nausea, vomiting, and abdominal pain.    Objective   Vital signs in last 24 hours: Temp:  [97.7 F (36.5 C)-98.1 F (36.7 C)] 98.1 F (36.7 C) (06/14 0735) Pulse Rate:  [93-110] 98 (06/14 0735) Resp:  [14-26] 20 (06/14 0735) BP: (119-158)/(75-96) 152/96 (06/14 1137) SpO2:  [89 %-95 %] 95 % (06/14 0737) Weight:  [68.3 kg] 68.3 kg (06/14 0414) Last BM Date : 10/13/22 Last BM recorded by nurses in past 5 days Stool Type: Type 6 (Mushy consistency with ragged edges) (10/13/2022  9:20 PM)  General:   female in no acute distress  Heart:  Regular rate and rhythm; no murmurs Pulm: Clear anteriorly; no wheezing Abdomen: soft, nondistended, normal bowel sounds in all quadrants. Nontender without guarding. No organomegaly appreciated. Extremities:  No edema Neurologic:  Alert and  oriented x4;  No focal deficits.  Psych:  Cooperative. Normal mood and  affect.  Intake/Output from previous day: 06/13 0701 - 06/14 0700 In: 480 [P.O.:480] Out: -  Intake/Output this shift: No intake/output data recorded.  Studies/Results: No results found.  Lab Results: Recent Labs    10/11/22 2355 10/12/22 2343 10/14/22 0023  WBC 7.2 7.1 9.4  HGB 9.8* 9.7* 10.4*  HCT 30.9* 29.9* 32.4*  PLT 290 293 304   BMET Recent Labs    10/11/22 2355 10/12/22 2343 10/14/22 0023  NA 141 142 142  K 3.4* 3.6 3.5  CL 112* 112* 111  CO2 21* 22 22  GLUCOSE 146* 128* 175*  BUN 12 11 7*  CREATININE 0.82 0.67 0.79  CALCIUM 9.4 9.4 9.6   LFT No results for input(s): "PROT", "ALBUMIN", "AST", "ALT", "ALKPHOS", "BILITOT", "BILIDIR", "IBILI" in the last 72 hours. PT/INR No results for input(s): "LABPROT", "INR" in the last 72 hours.   Scheduled Meds:  amLODipine  10 mg Oral Daily   amoxicillin-clavulanate  1 tablet Oral Q12H   aspirin EC  81 mg Oral Daily   atorvastatin  80 mg Oral Daily   Chlorhexidine Gluconate Cloth  6 each Topical Daily   ezetimibe  10 mg Oral Daily   FLUoxetine  20 mg Oral Daily   insulin aspart  0-9 Units Subcutaneous TID WC   ipratropium-albuterol  3 mL Nebulization BID   mesalamine  1,000 mg Rectal BID   multivitamin with minerals  1 tablet Oral Daily   pantoprazole  40 mg Oral Daily   polyethylene glycol  17 g Oral  Daily   tiZANidine  2 mg Oral BID   Continuous Infusions:    Patient profile:   Aldia Moppin is a 62 y.o. 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   07/08/2022 colonoscopy due to rectal bleeding and anemia on Brilinta shows 3 mm polyp ascending, 3 mm polyp descending, polypoid changes sigmoid colon,  tortuous colon, poor anal tone difficult to insufflate left colon/rectum, few ulcers in distal rectum, internal hemorrhoids Pathology showed active proctitis/no evidence of chronicity, granuloma, dysplasia or malignancy.  Negative CMV     Impression:   Proctitis - CT ab/pelvis showed new inferior rectal wall thickening and inflammation with new perirectal lymph nodes. Findings are worrisome for proctitis. Neoplasm not excluded. Increased diffuse retroperitoneal, mesenteric, pelvic sidewall, inguinal, and periportal lymphadenopathy. - hgb 9.7, stable -CXR persistent mild bronchopneumonia -Sigmoidoscopy 6/12: Perianal skin tags on perianal exam.  Scattered inflammation in rectum, rectosigmoid colon, sigmoid colon, distal descending colon.  Biopsies show moderate-severe nonspecific proctocolitis, CMV stain pending     Aspiration pneumonia -On 5-day course of Augmentin (last dose 6/15) -CXR persistent mild bronchopneumonia   History of multiple CVAs - on brillanta (currently on hold - last dose prior to admission 6/4)     Plan:   - await CMV stain results - continue canasa suppositories - GI will likely sign off    Bayley M McMichael  10/14/2022, 12:26 PM

## 2022-10-14 NOTE — Progress Notes (Addendum)
Mobility Specialist Progress Note:   10/14/22 1500  Mobility  Activity Transferred from bed to chair  Level of Assistance +2 (takes two people)  Assistive Device [employer] Ambulated (ft) 0 ft  Activity Response Tolerated well  Mobility Referral Yes  $Mobility charge 1 Mobility  Mobility Specialist Start Time (ACUTE ONLY) 1436  Mobility Specialist Stop Time (ACUTE ONLY) 1511  Mobility Specialist Time Calculation (min) (ACUTE ONLY) 35 min    Pre Mobility: 101 HR,  119/88 BP,  95% SpO2  Pt received in bed, assisted to the chair per request. MaxA for bed mobility and +2 ModA to stand. Wheeled Pt outside for a few minutes and returned Pt to room at end of session. Pt was left in chair with call bell and chair alarm on.  D'Vante Earlene Plater Mobility Specialist Please contact via Special educational needs teacher or Rehab office at [phone]

## 2022-10-14 NOTE — Plan of Care (Signed)
  Problem: Coping: Goal: Ability to adjust to condition or change in health will improve Outcome: Progressing   Problem: Metabolic: Goal: Ability to maintain appropriate glucose levels will improve Outcome: Progressing   Problem: Nutritional: Goal: Maintenance of adequate nutrition will improve Outcome: Progressing   Problem: Education: Goal: Knowledge of General Education information will improve Description: Including pain rating scale, medication(s)/side effects and non-pharmacologic comfort measures Outcome: Progressing   Problem: Clinical Measurements: Goal: Diagnostic test results will improve Outcome: Progressing   Problem: Nutrition: Goal: Adequate nutrition will be maintained Outcome: Progressing   Problem: Pain Managment: Goal: General experience of comfort will improve Outcome: Progressing

## 2022-10-14 NOTE — Progress Notes (Signed)
Mobility Specialist: Progress Note   10/14/22 1208  Mobility  Activity Transferred from bed to chair;Transferred from chair to bed  Level of Assistance +2 (takes two people)  Assistive Device Stedy  Activity Response Tolerated well  Mobility Referral Yes  $Mobility charge 1 Mobility  Mobility Specialist Start Time (ACUTE ONLY) 1105  Mobility Specialist Stop Time (ACUTE ONLY) 1156  Mobility Specialist Time Calculation (min) (ACUTE ONLY) 51 min   Pre-Mobility: 107 HR, 152/96 (113) BP, 91% SpO2 During Mobility: 149 HR, 93% SpO2 Post-Mobility: 125 HR, 97% SpO2  Pt received in the chair and agreeable to mobility. +1 maxA with bed mobility and modA +2 to stand from EOB. C/o mild dizziness after sitting EOB, otherwise asymptomatic. Practiced standing from [facility] flaps with pt requiring modA +2 to stand. Pt required physical assist to place LUE on Stedy but pt able to maintain grip on bar. Pt transferred back to the bed per RN request at end of session to go down for swallow study. RN present and aware of elevated HR.   Julyanna Scholle Mobility Specialist Please contact via SecureChat or Rehab office at [phone]

## 2022-10-14 NOTE — Progress Notes (Addendum)
Speech Language Pathology Treatment: Dysphagia  Patient Details Name: [name] MRN: [id number] DOB: 06-Aug-1960 Today's Date: 10/14/2022 Time: 4098-1191 SLP Time Calculation (min) (ACUTE ONLY): 21 min  Assessment / Plan / Recommendation Clinical Impression  Pt seen after this afternoon's MBS with recommended strategies. She was able to recall chin tuck with liquids and implemented 90% of the time using a straw. One instance she lifted her head before swallowing resulting in immediate cough. She does have anterior spill due to inability to seal lips given protruding upper anterior dentition. Observed taking pills with RN who cut large in half and pt needed additional trials pudding to propel. She attempted to use liquid to transit pill and had slight gag. Smaller pills in puree she did not have difficulty moving to posterior oral cavity. Recommend continue Dys 2 texture, thin liquids with chin tuck, crush large meds and small-medium pills whole in puree. ST will continue to work with pt on strategies and safety with recommendations. SLP received confirmation from GI that pt can advance from full liquids after sigmoidoscopy.    HPI HPI: Pt is a 62 y.o. female who was admitted on 6/4 with severe sepsis due to community-acquired pneumonia after presenting with tachycardia which was noted during outpatient GI visit. CT chest 6/4: Bilateral lower lobe ground-glass and patchy lung opacities with bronchial thickening concerning for bilateral lower lobe pneumonia. PMH: type 2 diabetes mellitus, essential hypertension, hyperlipidemia, stroke. MBS 5/26: moderate oral dysphagia due to dentition impacting labial seal and resulting in anterior spillage of liquids. Pharyngeal dysphagia with delayed initaition of swallow even when a adequate oral bolus hold is achieved. Pt had sensed and silent penetration before the swallow with subsequent aspiration of penetrate when glottic closure is released. A dysphagia 3 diet  with nectar thick liquids was recommended at that time. Pt discharged from hospital 5/28 and was on that diet at that time.Repeat MBS for possible upgrade.      SLP Plan  Continue with current plan of care      Recommendations for follow up therapy are one component of a multi-disciplinary discharge planning process, led by the attending physician.  Recommendations may be updated based on patient status, additional functional criteria and insurance authorization.    Recommendations  Diet recommendations: Dysphagia 2 (fine chop);Thin liquid (chin tuck with thin) Liquids provided via: Cup;Straw Medication Administration: Other (Comment) (crush large pills, small pills whole in puree) Supervision: Patient able to self feed (staff to assist with set up) Compensations: Slow rate;Small sips/bites;Chin tuck (chin tuck with liquids) Postural Changes and/or Swallow Maneuvers: Seated upright 90 degrees                  Oral care BID   Intermittent Supervision/Assistance Dysphagia, oropharyngeal phase (R13.12)     Continue with current plan of care     Royce Macadamia  10/14/2022, 2:29 PM

## 2022-10-14 NOTE — Progress Notes (Addendum)
Modified Barium Swallow Study  Patient Details  Name: [name] MRN: [id number] Date of Birth: 1961/01/20  Today's Date: 10/14/2022  Modified Barium Swallow completed.  Full report located under Chart Review in the Imaging Section.  History of Present Illness Pt is a 62 y.o. female who was admitted on 6/4 with severe sepsis due to community-acquired pneumonia after presenting with tachycardia which was noted during outpatient GI visit. CT chest 6/4: Bilateral lower lobe ground-glass and patchy lung opacities with bronchial thickening concerning for bilateral lower lobe pneumonia. PMH: type 2 diabetes mellitus, essential hypertension, hyperlipidemia, stroke. MBS 5/26: moderate oral dysphagia due to dentition impacting labial seal and resulting in anterior spillage of liquids. Pharyngeal dysphagia with delayed initaition of swallow even when a adequate oral bolus hold is achieved. Pt had sensed and silent penetration before the swallow with subsequent aspiration of penetrate when glottic closure is released. A dysphagia 3 diet with nectar thick liquids was recommended at that time. Pt discharged from hospital 5/28 and was on that diet at that time.Repeat MBS for possible upgrade.   Clinical Impression Pt demonstrated mild improvements from previous MBS. She has difficulty sealing lips due to projection of anterior dentition resulting in anterior spill with thin liquid. Decreased oral cohesion and transit of boluses with thin. She continues to exhibit decreased timing of laryngeal closure resulting in penetration and aspiration before laryngeal elevation and closure is achieved with thin liquids (tsp, cup). A supraglottic swallow strategy was ineffective and resulted in aspiration with reflexive cough, however a chin tuck consistently prevented penetration with cup and straw in 90% of opportunities throughout the study. There was no significant pharyngeal residue and esophageal scan was unremarkable.  Pt is currently on a puree diet and recommend upgrade diet texture to Dys 2 as she was able to masticate slowly but effectively. Upgrade to thin liquids with a chin tuck with or without straw use, pills whole in  puree and assist with meals to ensure performance of strategies. Factors that may increase risk of adverse event in presence of aspiration [name] & [name] 2021): Inadequate oral hygiene  Swallow Evaluation Recommendations Recommendations: PO diet PO Diet Recommendation: Dysphagia 2 (Finely chopped);Thin liquids (Level 0) (thin with chin tuck) Liquid Administration via: Straw Medication Administration: Whole meds with puree Supervision: Patient able to self-feed Swallowing strategies  : Slow rate;Small bites/sips;Chin tuck (with thin) Postural changes: Position pt fully upright for meals Oral care recommendations: Oral care BID (2x/day)      Royce Macadamia 10/14/2022,1:49 PM

## 2022-10-14 NOTE — Progress Notes (Signed)
PROGRESS NOTE    [name]  YNW:[id number] DOB: 1960-10-05 DOA: 10/04/2022 PCP: [place], [name], MD   Brief Narrative:  Patient is a 62 year old African-American female with past medical history significant for DM2, HTN, HLD, CVA with residual left-sided weakness/hemiparesis.  Patient presented with shortness of breath.  Patient was recently hospitalized for aspiration pneumonia/proctitis completed a course of Unasyn and was discharged on mesalamine suppository.  When seen by outpatient [facility] gastroenterology patient was noted to be tachycardic and patient was therefore sent to the ER.  CTA chest was negative for PE but did show bilateral multifocal opacity.  CT of the abdomen pelvis was negative.  Patient was admitted with sepsis due to recurrent aspiration pneumonia/proctitis on empiric Rocephin, azithromycin and Flagyl.  Antibiotics were initially transitioned to Rocephin, and now on Augmentin.  Patient will complete course of oral Augmentin on 10/15/2022 (as per collateral information).  Patient underwent Flex Sig by GI.  Flexible sigmoidoscopy revealed multiple ulcers involving the rectosigmoid area.  Biopsies were taken and pathology result is still pending.    10/13/2022: Patient seen alongside patient's sister.  Patient has significant dysarthria.  No new complaints.  10 pathology result.  Likely discharge back to the skilled nursing facility when cleared for discharge by the GI team.  10/14/2022: Patient seen.  Patient is not keen on being discharged today.  GI input is appreciated.  Biopsy revealed moderate to severe nonspecific proctocolitis.  Likely discharge tomorrow.  No new complaints.  GI has recommended resuming mesalamine suppository.   Assessment & Plan:  Principal Problem:   CAP (community acquired pneumonia) Active Problems:   Essential hypertension   DM2 (diabetes mellitus, type 2) (HCC)   HLD (hyperlipidemia)   Rectal pain   Severe sepsis (HCC)   Dehydration   Acute  prerenal azotemia   Proctitis   Hypercalcemia   Transaminitis   Pyuria   Depression   Protein-calorie malnutrition, severe     Severe sepsis secondary to recurrent aspiration pneumonia Mild urinary tract infection with possible Pyelonephritis.  Initially showing signs of sepsis but now they are improving.  CTA showed bilateral multifocal opacities.  Procalcitonin 0.7, BNP negative.  Chest x-ray shows bronchopneumonia with atelectasis. - Will trend patient to Augmentin, total 5 days (until 6/15) - Cultures remain negative.  Legionella/strep-negative -Speech and swallow-regular diet, nectar thick.  -She really needs to get out of bed to chair, use IS/Flutter. Nebs BID and prn. 6/43/2024: Sepsis physiology has resolved significantly.   Bilateral hilar lymphadenopathy Suspect from underlying infection.  This is seen on the CT scan.  Needs to follow-up outpatient and have a repeat scan to ensure this is nothing else concerning   Proctitis: With significant rectal pain Multiple rectal ulcers Routinely follows with outpatient [facility].  Currently GI is recommending mesalamine twice daily and MiraLAX twice daily.  Minimal improvement in her symptoms, Flex sig showed multiple ulcers/inflammation, biopsy taken. 10/13/2022: See above documentation.  Patient has undergone flexible sigmoidoscopy.  Pathology result is being awaited.  GI is directing care.  Discharge back to skilled nursing facility once cleared for discharge by the GI team. 10/14/2022: See above documentation.  Likely discharge tomorrow.   Hypokalemia/hypocalcemia Replete as needed 10/14/2022: Potassium of 3.5 today.  Will repeat.     Elevated liver enzymes: Overall stable   Type 2 diabetes: Holding Trulicity.  Sliding scale and Accu-Cheks   Essential hypertension Currently on Norvasc.  IV as needed   Hyperlipidemia: Resumed statin as LFTs improved.   Major depression:  Continue home Prozac.   CVA with left-sided  weakness: Aspirin and Lipitor   Vaginal bleeding: Seen by GYN.  GC / CT/ trichomoniasis / BV / Candida vaginal swabs-pending   DVT prophylaxis: SCDs Code Status:Full code Family Communication: (No family at bed side) Disposition Plan:     Status is: Inpatient Anticipate discharge back to SNF once her rectal symptoms have improved        Diet Orders (From admission, onward)     Start     Ordered   10/14/22 1421  DIET DYS 2 Room service appropriate? No; Fluid consistency: Thin  Diet effective now       Question Answer Comment  Room service appropriate? No   Fluid consistency: Thin      10/14/22 1421            Subjective: Still having rectal pain and discomfort  Examination: Constitutional: Chronically ill looking.  Dysarthric.  Not in any distress.  Alert.   Respiratory: Decreased air entry.   Cardiovascular: S1-S2.   Abdomen: Nontender nondistended good bowel sounds Musculoskeletal: No edema noted Neurologic: Left-sided hemiplegia.  Dysarthric.  Awake and alert.   EXTR: No leg edema.  Objective: Vitals:   10/14/22 0735 10/14/22 0737 10/14/22 1137 10/14/22 1554  BP: (!) 141/85  (!) 152/96 (!) 147/90  Pulse: 98   (!) 103  Resp: 20   (!) 24  Temp: 98.1 F (36.7 C)   97.8 F (36.6 C)  TempSrc: Oral   Oral  SpO2: 91% 95%    Weight:      Height:       No intake or output data in the 24 hours ending 10/14/22 1744  Filed Weights   10/12/22 1000 10/13/22 0500 10/14/22 0414  Weight: 66.7 kg 67 kg 68.3 kg    Scheduled Meds:  amLODipine  10 mg Oral Daily   amoxicillin-clavulanate  1 tablet Oral Q12H   aspirin EC  81 mg Oral Daily   atorvastatin  80 mg Oral Daily   Chlorhexidine Gluconate Cloth  6 each Topical Daily   ezetimibe  10 mg Oral Daily   FLUoxetine  20 mg Oral Daily   insulin aspart  0-9 Units Subcutaneous TID WC   ipratropium-albuterol  3 mL Nebulization BID   mesalamine  1,000 mg Rectal BID   multivitamin with minerals  1 tablet Oral Daily    pantoprazole  40 mg Oral Daily   polyethylene glycol  17 g Oral Daily   tiZANidine  2 mg Oral BID   Continuous Infusions:     Nutritional status Signs/Symptoms: percent weight loss, moderate muscle depletion, severe muscle depletion Percent weight loss: 22.3 % Interventions: Magic cup, MVI, Refer to RD note for recommendations Body mass index is 25.85 kg/m.  Data Reviewed:   CBC: Recent Labs  Lab 10/10/22 0024 10/11/22 0026 10/11/22 2355 10/12/22 2343 10/14/22 0023  WBC 7.3 9.2 7.2 7.1 9.4  HGB 10.7* 10.6* 9.8* 9.7* 10.4*  HCT 33.0* 33.0* 30.9* 29.9* 32.4*  MCV 79.7* 78.2* 78.8* 79.3* 79.8*  PLT 298 316 290 293 304    Basic Metabolic Panel: Recent Labs  Lab 10/09/22 0015 10/10/22 0024 10/11/22 0026 10/11/22 2355 10/12/22 2343 10/14/22 0023  NA 141 139 141 141 142 142  K 3.1* 3.8 3.8 3.4* 3.6 3.5  CL 110 110 111 112* 112* 111  CO2 22 21* 21* 21* 22 22  GLUCOSE 128* 126* 119* 146* 128* 175*  BUN 6* 6* 10 12 11  7*  CREATININE 0.66 0.63 0.77 0.82 0.67 0.79  CALCIUM 9.3 9.4 9.7 9.4 9.4 9.6  MG 1.9 1.9 2.1 2.0 1.9 1.9  PHOS 2.9  --   --   --   --   --     GFR: Estimated Creatinine Clearance: 68.3 mL/min (by C-G formula based on SCr of 0.79 mg/dL). Liver Function Tests: Recent Labs  Lab 10/09/22 0015  AST 23  ALT 47*  ALKPHOS 89  BILITOT 0.4  PROT 6.0*  ALBUMIN 2.5*    No results for input(s): "LIPASE", "AMYLASE" in the last 168 hours. No results for input(s): "AMMONIA" in the last 168 hours. Coagulation Profile: No results for input(s): "INR", "PROTIME" in the last 168 hours.  Cardiac Enzymes: No results for input(s): "CKTOTAL", "CKMB", "CKMBINDEX", "TROPONINI" in the last 168 hours. BNP (last 3 results) No results for input(s): "PROBNP" in the last 8760 hours. HbA1C: No results for input(s): "HGBA1C" in the last 72 hours. CBG: Recent Labs  Lab 10/13/22 1558 10/13/22 2053 10/14/22 0621 10/14/22 1108 10/14/22 1552  GLUCAP 153* 137*  152* 146* 143*    Lipid Profile: No results for input(s): "CHOL", "HDL", "LDLCALC", "TRIG", "CHOLHDL", "LDLDIRECT" in the last 72 hours. Thyroid Function Tests: No results for input(s): "TSH", "T4TOTAL", "FREET4", "T3FREE", "THYROIDAB" in the last 72 hours. Anemia Panel: No results for input(s): "VITAMINB12", "FOLATE", "FERRITIN", "TIBC", "IRON", "RETICCTPCT" in the last 72 hours. Sepsis Labs: Recent Labs  Lab 10/11/22 0026  PROCALCITON 0.62     Recent Results (from the past 240 hour(s))  Culture, blood (routine x 2)     Status: None   Collection Time: 10/04/22  7:40 PM   Specimen: BLOOD RIGHT HAND  Result Value Ref Range Status   Specimen Description BLOOD RIGHT HAND  Final   Special Requests   Final    BOTTLES DRAWN AEROBIC ONLY Blood Culture adequate volume   Culture   Final    NO GROWTH 5 DAYS Performed at [facility], [address], [place], [place] [address]    Report Status 10/09/2022 FINAL  Final  MRSA Next Gen by PCR, Nasal     Status: None   Collection Time: 10/05/22  5:34 AM   Specimen: Nasal Mucosa; Nasal Swab  Result Value Ref Range Status   MRSA by PCR Next Gen NOT DETECTED NOT DETECTED Final    Comment: (NOTE) The GeneXpert MRSA Assay (FDA approved for NASAL specimens only), is one component of a comprehensive MRSA colonization surveillance program. It is not intended to diagnose MRSA infection nor to guide or monitor treatment for MRSA infections. Test performance is not FDA approved in patients less than 33 years old. Performed at [facility], [address] [place], [place] [address]          Radiology Studies: DG Swallowing Func-Speech Pathology  Result Date: 10/14/2022 Table formatting from the original result was not included. Modified Barium Swallow Study Patient Details Name: Mariabella Caccamise MRN: [id number] Date of Birth: 1961-03-21 Today's Date: 10/14/2022 HPI/PMH: HPI: Pt is a 62 y.o. female who was admitted on 6/4 with severe  sepsis due to community-acquired pneumonia after presenting with tachycardia which was noted during outpatient GI visit. CT chest 6/4: Bilateral lower lobe ground-glass and patchy lung opacities with bronchial thickening concerning for bilateral lower lobe pneumonia. PMH: type 2 diabetes mellitus, essential hypertension, hyperlipidemia, stroke. MBS 5/26: moderate oral dysphagia due to dentition impacting labial seal and resulting in anterior spillage of liquids. Pharyngeal dysphagia with delayed  initaition of swallow even when a adequate oral bolus hold is achieved. Pt had sensed and silent penetration before the swallow with subsequent aspiration of penetrate when glottic closure is released. A dysphagia 3 diet with nectar thick liquids was recommended at that time. Pt discharged from hospital 5/28 and was on that diet at that time.Repeat MBS for possible upgrade. Clinical Impression: Clinical Impression: Pt demonstrated mild improvements from previous MBS. She has difficulty sealing lips due to projection of anterior dentition resulting in anterior spill with thin liquid. Decreased oral cohesion and transit of boluses with thin. She continues to exhibit decreased timing of laryngeal closure resulting in penetration and aspiration before laryngeal elevation and closure is achieved with thin liquids (tsp, cup). A supraglottic swallow strategy was ineffective and resulted in aspiration however a chin tuck consistently prevented penetration with cup and straw in 90% of opportunities throughout the study. There was no significant pharyngeal residue and esophageal scan was unremarkable. Pt is currently on a puree diet and recommend upgrade diet texture to Dys 2 as she was able to masticate slowly but effectively. Upgrade to thin liquids with a chin tuck with or without straw use, pills whole in  puree and assist with meals to ensure performance of strategies. Factors that may increase risk of adverse event in presence  of aspiration [name] & [name] 2021): Factors that may increase risk of adverse event in presence of aspiration [name] & [name] 2021): Inadequate oral hygiene Recommendations/Plan: Swallowing Evaluation Recommendations Swallowing Evaluation Recommendations Recommendations: PO diet PO Diet Recommendation: Dysphagia 2 (Finely chopped); Thin liquids (Level 0) (thin with chin tuck) Liquid Administration via: Straw Medication Administration: Whole meds with puree Supervision: Patient able to self-feed Swallowing strategies  : Slow rate; Small bites/sips; Chin tuck (with thin) Postural changes: Position pt fully upright for meals Oral care recommendations: Oral care BID (2x/day) Treatment Plan Treatment Plan Treatment recommendations: Therapy as outlined in treatment plan below Follow-up recommendations: Skilled nursing-short term rehab (<3 hours/day) Functional status assessment: Patient has had a recent decline in their functional status and demonstrates the ability to make significant improvements in function in a reasonable and predictable amount of time. Treatment frequency: Min 2x/week Treatment duration: 2 weeks Interventions: Aspiration precaution training; Compensatory techniques; Patient/family education; Trials of upgraded texture/liquids; Diet toleration management by SLP Recommendations Recommendations for follow up therapy are one component of a multi-disciplinary discharge planning process, led by the attending physician.  Recommendations may be updated based on patient status, additional functional criteria and insurance authorization. Assessment: Orofacial Exam: Orofacial Exam Oral Cavity: Oral Hygiene: WFL Oral Cavity - Dentition: Missing dentition Anatomy: Anatomy: WFL Boluses Administered: Boluses Administered Boluses Administered: Thin liquids (Level 0); Mildly thick liquids (Level 2, nectar thick); Puree; Solid  Oral Impairment Domain: Oral Impairment Domain Lip Closure: Escape beyond mid-chin  Tongue control during bolus hold: Posterior escape of less than half of bolus Bolus preparation/mastication: Slow prolonged chewing/mashing with complete recollection Bolus transport/lingual motion: Delayed initiation of tongue motion (oral holding) Oral residue: Trace residue lining oral structures Location of oral residue : Tongue Initiation of pharyngeal swallow : Pyriform sinuses  Pharyngeal Impairment Domain: Pharyngeal Impairment Domain Soft palate elevation: No bolus between soft palate (SP)/pharyngeal wall (PW) Laryngeal elevation: Partial superior movement of thyroid cartilage/partial approximation of arytenoids to epiglottic petiole Anterior hyoid excursion: Partial anterior movement Epiglottic movement: Complete inversion Laryngeal vestibule closure: Incomplete, narrow column air/contrast in laryngeal vestibule Pharyngeal stripping wave : Present - complete Pharyngeal contraction (A/P view only): N/A Pharyngoesophageal segment opening:  Complete distension and complete duration, no obstruction of flow Tongue base retraction: Trace column of contrast or air between tongue base and PPW Pharyngeal residue: Complete pharyngeal clearance  Esophageal Impairment Domain: Esophageal Impairment Domain Esophageal clearance upright position: Complete clearance, esophageal coating Pill: No data recorded Penetration/Aspiration Scale Score: Penetration/Aspiration Scale Score 1.  Material does not enter airway: Solid; Puree 3.  Material enters airway, remains ABOVE vocal cords and not ejected out: Mildly thick liquids (Level 2, nectar thick) 7.  Material enters airway, passes BELOW cords and not ejected out despite cough attempt by patient: Thin liquids (Level 0) Compensatory Strategies: Compensatory Strategies Compensatory strategies: Yes Straw: Effective Effective Straw: Thin liquid (Level 0) (with chin tuck) Chin tuck: Effective Effective Chin Tuck: Thin liquid (Level 0) Supraglottic swallow: Ineffective Ineffective  Supraglottic Swallow: Thin liquid (Level 0)   General Information: Caregiver present: No  Diet Prior to this Study: Other (Comment) (full liquids nectar thidck)   Temperature : Normal   Respiratory Status: WFL   Supplemental O2: Nasal cannula   History of Recent Intubation: No  Behavior/Cognition: Alert; Cooperative; Pleasant mood Self-Feeding Abilities: Able to self-feed Baseline vocal quality/speech: Normal Volitional Cough: Able to elicit Volitional Swallow: Able to elicit No data recorded Goal Planning: Prognosis for improved oropharyngeal function: Good No data recorded No data recorded No data recorded Consulted and agree with results and recommendations: Patient Pain: Pain Assessment Pain Assessment: Faces Faces Pain Scale: 0 End of Session: Start Time:SLP Start Time (ACUTE ONLY): 1213 Stop Time: SLP Stop Time (ACUTE ONLY): 1234 Time Calculation:SLP Time Calculation (min) (ACUTE ONLY): 21 min Charges: SLP Evaluations $ SLP Speech Visit: 1 Visit SLP Evaluations $MBS Swallow: 1 Procedure $Swallowing Treatment: 1 Procedure SLP visit diagnosis: SLP Visit Diagnosis: Dysphagia, oropharyngeal phase (R13.12) Past Medical History: Past Medical History: Diagnosis Date  Abnormality of gait 05/10/2010  BACK PAIN 11/14/2008  Class 1 obesity due to excess calories with body mass index (BMI) of 31.0 to 31.9 in adult 02/07/2021  DIABETES MELLITUS, TYPE II 07/15/2008  Diplopia 07/15/2008  ECZEMA, ATOPIC 04/03/2009  GERD (gastroesophageal reflux disease)   Guillain-Barre (HCC) 1988  HYPERLIPIDEMIA 03/06/2009  HYPERTENSION 07/15/2008  Stroke (HCC) 2010, 2011  x2   Vertebral artery stenosis  Past Surgical History: Past Surgical History: Procedure Laterality Date  ABDOMINAL HYSTERECTOMY    BIOPSY  07/08/2022  Procedure: BIOPSY;  Surgeon: Benancio Deeds, MD;  Location: [facility];  Service: Gastroenterology;;  COLONOSCOPY WITH PROPOFOL N/A 07/08/2022  Procedure: COLONOSCOPY WITH PROPOFOL;  Surgeon: Benancio Deeds, MD;   Location: [facility];  Service: Gastroenterology;  Laterality: N/A;  DILATION AND CURETTAGE OF UTERUS    FOOT SURGERY    IR ANGIO INTRA EXTRACRAN SEL COM CAROTID INNOMINATE UNI L MOD SED  09/17/2021  IR ANGIO INTRA EXTRACRAN SEL INTERNAL CAROTID UNI R MOD SED  09/17/2021  IR ANGIO VERTEBRAL SEL VERTEBRAL UNI R MOD SED  09/17/2021  IR US GUIDE VASC ACCESS RIGHT  09/17/2021 Royce Macadamia 10/14/2022, 1:48 PM          LOS: 10 days   Time spent= 35 mins    Barnetta Chapel, MD [facility]  If 7PM-7AM, please contact night-coverage  10/14/2022, 5:44 PM

## 2022-10-15 DIAGNOSIS — K529 Noninfective gastroenteritis and colitis, unspecified: Secondary | ICD-10-CM | POA: Diagnosis not present

## 2022-10-15 LAB — CBC
HCT: 32.2 % — ABNORMAL LOW (ref 36.0–46.0)
Hemoglobin: 10.5 g/dL — ABNORMAL LOW (ref 12.0–15.0)
MCH: 25.8 pg — ABNORMAL LOW (ref 26.0–34.0)
MCHC: 32.6 g/dL (ref 30.0–36.0)
MCV: 79.1 fL — ABNORMAL LOW (ref 80.0–100.0)
Platelets: 293 10*3/uL (ref 150–400)
RBC: 4.07 MIL/uL (ref 3.87–5.11)
RDW: 16.1 % — ABNORMAL HIGH (ref 11.5–15.5)
WBC: 8.2 10*3/uL (ref 4.0–10.5)
nRBC: 0 % (ref 0.0–0.2)

## 2022-10-15 LAB — GLUCOSE, CAPILLARY
Glucose-Capillary: 116 mg/dL — ABNORMAL HIGH (ref 70–99)
Glucose-Capillary: 191 mg/dL — ABNORMAL HIGH (ref 70–99)
Glucose-Capillary: 131 mg/dL — ABNORMAL HIGH (ref 70–99)
Glucose-Capillary: 71 mg/dL (ref 70–99)

## 2022-10-15 LAB — RENAL FUNCTION PANEL
Albumin: 2.5 g/dL — ABNORMAL LOW (ref 3.5–5.0)
Anion gap: 9 (ref 5–15)
BUN: 6 mg/dL — ABNORMAL LOW (ref 8–23)
CO2: 22 mmol/L (ref 22–32)
Calcium: 9.4 mg/dL (ref 8.9–10.3)
Chloride: 110 mmol/L (ref 98–111)
Creatinine, Ser: 0.72 mg/dL (ref 0.44–1.00)
GFR, Estimated: >60 mL/min (ref >60)
Glucose, Bld: 129 mg/dL — ABNORMAL HIGH (ref 70–99)
Phosphorus: 2.7 mg/dL (ref 2.5–4.6)
Potassium: 4.8 mmol/L (ref 3.5–5.1)
Sodium: 141 mmol/L (ref 135–145)

## 2022-10-15 LAB — MAGNESIUM: Magnesium: 2 mg/dL (ref 1.7–2.4)

## 2022-10-15 NOTE — [place] FL2 (Cosign Needed Addendum)
[place] MEDICAID FL2 LEVEL OF CARE FORM     IDENTIFICATION  Patient Name: [name] Birthdate: Feb 15, 1961 Sex: female Admission Date (Current Location): 10/04/2022  [facility] and [place] Number:  Producer, television/film/video and Address:  [facility] [place], [address] [place], [place] [address]      Provider Number: [id number]  Attending Physician Name and Address:  Barnetta Chapel, MD  Relative Name and Phone Number:       Current Level of Care: Hospital Recommended Level of Care: Skilled Nursing Facility Prior Approval Number:    Date Approved/Denied:   PASRR Number: [id number]  Discharge Plan: SNF    Current Diagnoses: Patient Active Problem List   Diagnosis Date Noted   Protein-calorie malnutrition, severe 10/08/2022   Severe sepsis (HCC) 10/05/2022   Dehydration 10/05/2022   Acute prerenal azotemia 10/05/2022   Proctitis 10/05/2022   Hypercalcemia 10/05/2022   Transaminitis 10/05/2022   Pyuria 10/05/2022   Depression 10/05/2022   CAP (community acquired pneumonia) 10/04/2022   Malnutrition of moderate degree 09/26/2022   SOB (shortness of breath) 09/25/2022   Nausea vomiting and diarrhea 09/24/2022   Shortness of breath 09/24/2022   Acute respiratory failure with hypoxia (HCC) 09/24/2022   LFT elevation 09/24/2022   Diabetes mellitus (HCC) 09/06/2022   Rectal ulcer 07/09/2022   Colon ulcer 07/08/2022   Rectal bleeding 07/07/2022   Anemia 07/07/2022   GI bleed 07/06/2022   Dysarthria 06/12/2022   Aspiration pneumonia (HCC) 06/12/2022   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   Anxiety disorder due to medical condition 06/10/2022   AKI (acute kidney injury) (HCC) 06/01/2022   Dizziness 05/29/2022   Personal history of CLL (chronic lymphocytic leukemia) 05/29/2022   Rectal pain 05/29/2022   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 disease, with long-term current use of insulin (HCC) 04/14/2022   Type 2 diabetes mellitus  with diabetic polyneuropathy, with long-term current use of insulin (HCC) 04/14/2022   Antiplatelet or antithrombotic long-term use 03/16/2022   HLD (hyperlipidemia) 10/01/2021   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HCC) 09/17/2021   Stage 3a chronic kidney disease (CKD) (HCC) 09/16/2021   Thyroid nodule 09/16/2021   Cervical lymphadenopathy 09/16/2021   Acute CVA (cerebrovascular accident) (HCC) 09/15/2021   GERD (gastroesophageal reflux disease) 04/30/2021   Peripheral arterial disease (HCC) 04/13/2021   Atherosclerosis of aorta (HCC) 02/14/2021   Cerebral thrombosis with cerebral infarction 02/08/2021   Abnormal MRI of head 02/07/2021   Vertigo 02/07/2021   Class 1 obesity due to excess calories with body mass index (BMI) of 31.0 to 31.9 in adult 02/07/2021   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   Trigger finger, left ring finger 10/04/2018   Alternating constipation and diarrhea 04/20/2018   Diabetic peripheral neuropathy associated with type 2 diabetes mellitus (HCC) 02/26/2018   DM2 (diabetes mellitus, type 2) (HCC) 07/29/2015   ABNORMALITY OF GAIT 05/10/2010   ECZEMA, ATOPIC 04/03/2009   Hyperlipidemia associated with type 2 diabetes mellitus (HCC) 03/06/2009   BACK PAIN 11/14/2008   Hemiparesis affecting left side as late effect of stroke (HCC) 08/05/2008   DIPLOPIA 07/15/2008   Essential hypertension 07/15/2008    Orientation RESPIRATION BLADDER Height & Weight     Self, Time, Situation, Place  O2 (2 liters) Incontinent Weight: 150 lb 9.2 oz (68.3 kg) Height:  5\' 4"  (162.6 cm)  BEHAVIORAL SYMPTOMS/MOOD NEUROLOGICAL BOWEL NUTRITION STATUS      Incontinent Diet (See dc summary)  AMBULATORY STATUS COMMUNICATION OF NEEDS Skin  Extensive Assist Verbally (Difficulty speaking) Normal                       Personal Care Assistance Level of Assistance  Bathing, Feeding, Dressing Bathing Assistance: Maximum assistance Feeding assistance: Independent Dressing  Assistance: Maximum assistance     Functional Limitations Info  Sight, Hearing, Speech Sight Info: Adequate Hearing Info: Adequate Speech Info: Adequate    SPECIAL CARE FACTORS FREQUENCY  PT (By licensed PT), OT (By licensed OT)     PT Frequency: 5xweek OT Frequency: 5xweek            Contractures Contractures Info: Not present    Additional Factors Info  Code Status, Allergies Code Status Info: Full Allergies Info: NKA           Current Medications (10/15/2022):  This is the current hospital active medication list Current Facility-Administered Medications  Medication Dose Route Frequency Provider Last Rate Last Admin   acetaminophen (TYLENOL) tablet 650 mg  650 mg Oral Q6H PRN Cirigliano, Vito V, DO   650 mg at 10/13/22 2143   Or   acetaminophen (TYLENOL) suppository 650 mg  650 mg Rectal Q6H PRN Cirigliano, Vito V, DO       amLODipine (NORVASC) tablet 10 mg  10 mg Oral Daily Cirigliano, Vito V, DO   10 mg at 10/15/22 0911   aspirin EC tablet 81 mg  81 mg Oral Daily Cirigliano, Vito V, DO   81 mg at 10/15/22 0912   atorvastatin (LIPITOR) tablet 80 mg  80 mg Oral Daily Cirigliano, Vito V, DO   80 mg at 10/15/22 0912   Chlorhexidine Gluconate Cloth 2 % PADS 6 each  6 each Topical Daily Cirigliano, Vito V, DO   6 each at 10/13/22 1000   cyclobenzaprine (FLEXERIL) tablet 5 mg  5 mg Oral TID PRN Dimple Nanas, MD   5 mg at 10/12/22 1738   ezetimibe (ZETIA) tablet 10 mg  10 mg Oral Daily Cirigliano, Vito V, DO   10 mg at 10/15/22 0912   FLUoxetine (PROZAC) capsule 20 mg  20 mg Oral Daily Cirigliano, Vito V, DO   20 mg at 10/15/22 0911   guaiFENesin (ROBITUSSIN) 100 MG/5ML liquid 5 mL  5 mL Oral Q4H PRN Cirigliano, Vito V, DO   5 mL at 10/11/22 0342   hydrALAZINE (APRESOLINE) injection 10 mg  10 mg Intravenous Q4H PRN Cirigliano, Vito V, DO       hydrOXYzine (ATARAX) tablet 10 mg  10 mg Oral TID PRN Cirigliano, Vito V, DO       insulin aspart (novoLOG) injection 0-9 Units   0-9 Units Subcutaneous TID WC Cirigliano, Vito V, DO   2 Units at 10/14/22 1707   ipratropium-albuterol (DUONEB) 0.5-2.5 (3) MG/3ML nebulizer solution 3 mL  3 mL Nebulization Q4H PRN Cirigliano, Vito V, DO       ipratropium-albuterol (DUONEB) 0.5-2.5 (3) MG/3ML nebulizer solution 3 mL  3 mL Nebulization BID Berton Mount I, MD   3 mL at 10/15/22 0752   lip balm (CARMEX) ointment   Topical PRN Cirigliano, Vito V, DO       melatonin tablet 3 mg  3 mg Oral QHS PRN Cirigliano, Vito V, DO   3 mg at 10/12/22 2154   mesalamine (CANASA) suppository 1,000 mg  1,000 mg Rectal BID Cirigliano, Vito V, DO   1,000 mg at 10/15/22 0911   metoprolol tartrate (LOPRESSOR) injection 5 mg  5 mg Intravenous Q4H  PRN Cirigliano, Vito V, DO       morphine (PF) 2 MG/ML injection 2 mg  2 mg Intravenous Q3H PRN Dimple Nanas, MD   2 mg at 10/13/22 0108   multivitamin with minerals tablet 1 tablet  1 tablet Oral Daily Cirigliano, Vito V, DO   1 tablet at 10/15/22 0912   naloxone (NARCAN) injection 0.4 mg  0.4 mg Intravenous PRN Cirigliano, Vito V, DO       ondansetron (ZOFRAN) injection 4 mg  4 mg Intravenous Q6H PRN Cirigliano, Vito V, DO   4 mg at 10/07/22 0523   pantoprazole (PROTONIX) EC tablet 40 mg  40 mg Oral Daily Cirigliano, Vito V, DO   40 mg at 10/15/22 0912   polyethylene glycol (MIRALAX / GLYCOLAX) packet 17 g  17 g Oral Daily Cirigliano, Vito V, DO   17 g at 10/13/22 1105   promethazine (PHENERGAN) tablet 12.5 mg  12.5 mg Oral Q6H PRN Cirigliano, Vito V, DO       senna-docusate (Senokot-S) tablet 1 tablet  1 tablet Oral QHS PRN Cirigliano, Vito V, DO       tiZANidine (ZANAFLEX) tablet 2 mg  2 mg Oral BID Cirigliano, Vito V, DO   2 mg at 10/15/22 0912   traZODone (DESYREL) tablet 50 mg  50 mg Oral QHS PRN Cirigliano, Vito V, DO         Discharge Medications: Please see discharge summary for a list of discharge medications.  Relevant Imaging Results:  Relevant Lab Results:   Additional  Information SSN: [id number]  Oletta Lamas, MSW, [facility] Transitions of Care  Clinical Social Worker I

## 2022-10-15 NOTE — Progress Notes (Addendum)
Mobility Specialist: Progress Note   10/15/22 1234  Mobility  Activity Transferred from bed to chair  Level of Assistance Maximum assist, patient does 25-49%  Assistive Device Stedy  Activity Response Tolerated well  Mobility Referral Yes  $Mobility charge 1 Mobility  Mobility Specialist Start Time (ACUTE ONLY) 1155  Mobility Specialist Stop Time (ACUTE ONLY) 1230  Mobility Specialist Time Calculation (min) (ACUTE ONLY) 35 min   Pre-Mobility: 88 HR, 96% SpO2 During Mobility: 125 HR, 97% SpO2 Post-Mobility: 93 HR, 92% SpO2  Pt received in the bed and agreeable to mobility. MaxA with bed mobility as well as to stand. Pt assisted with pericare from having BM in the bed. STS x5 for pericare. C/o SOB, otherwise asymptomatic. Pt is in the chair with call bell and phone in reach. Chair alarm is on.   Khristine Verno Mobility Specialist Please contact via SecureChat or Rehab office at [phone]

## 2022-10-15 NOTE — Hospital Course (Signed)
62yo female with h/o DM, HTN, HLD, and CVA with residual L hemiparesis who presented on 6/4 with SOB.  She was previously hospitalized for aspiration PNA/proctitis that was treated with Unasyn and mesalamine suppositories.  She was diagnosed with severe sepsis from recurrent aspiration PNA with concern for UTI and was treated with Augmentin through 6/15.  Speech therapy has evaluated and recommends regular diet with nectar thick liquids.  She continues to have significant rectal pain from proctitis and is being followed by GI and treated with mesalamine BID and Miralax BID.  She is anticipated to be discharged to SNF rehab.

## 2022-10-15 NOTE — Progress Notes (Signed)
PROGRESS NOTE    [name]  ZOX:[id number] DOB: Jan 11, 1961 DOA: 10/04/2022 PCP: [place], [name], MD   Brief Narrative:  Patient is a 62 year old African-American female with past medical history significant for DM2, HTN, HLD, CVA with residual left-sided weakness/hemiparesis.  Patient presented with shortness of breath.  Patient was recently hospitalized for aspiration pneumonia/proctitis completed a course of Unasyn and was discharged on mesalamine suppository.  When seen by outpatient [facility] gastroenterology patient was noted to be tachycardic and patient was therefore sent to the ER.  CTA chest was negative for PE but did show bilateral multifocal opacity.  CT of the abdomen pelvis was negative.  Patient was admitted with sepsis due to recurrent aspiration pneumonia/proctitis on empiric Rocephin, azithromycin and Flagyl.  Antibiotics were initially transitioned to Rocephin, and now on Augmentin.  Patient will complete course of oral Augmentin on 10/15/2022 (as per collateral information).  Patient underwent Flex Sig by GI.  Flexible sigmoidoscopy revealed multiple ulcers involving the rectosigmoid area.  Biopsies were taken and pathology result is still pending.    10/13/2022: Patient seen alongside patient's sister.  Patient has significant dysarthria.  No new complaints.  10 pathology result.  Likely discharge back to the skilled nursing facility when cleared for discharge by the GI team.  10/14/2022: Patient seen.  Patient is not keen on being discharged today.  GI input is appreciated.  Biopsy revealed moderate to severe nonspecific proctocolitis.  Likely discharge tomorrow.  No new complaints.  GI has recommended resuming mesalamine suppository.  10/15/2022: Patient is stable for discharge.  Patient continues to improve.  Communicated with associated walker, at the skilled nursing facility will reassess patient on Monday, 10/17/2022.  No new complaints today.   Assessment & Plan:  Principal  Problem:   CAP (community acquired pneumonia) Active Problems:   Essential hypertension   DM2 (diabetes mellitus, type 2) (HCC)   HLD (hyperlipidemia)   Rectal pain   Severe sepsis (HCC)   Dehydration   Acute prerenal azotemia   Proctitis   Hypercalcemia   Transaminitis   Pyuria   Depression   Protein-calorie malnutrition, severe     Severe sepsis secondary to recurrent aspiration pneumonia Mild urinary tract infection with possible Pyelonephritis.  Initially showing signs of sepsis but now they are improving.  CTA showed bilateral multifocal opacities.  Procalcitonin 0.7, BNP negative.  Chest x-ray shows bronchopneumonia with atelectasis. - Will trend patient to Augmentin, total 5 days (until 6/15) - Cultures remain negative.  Legionella/strep-negative -Speech and swallow-regular diet, nectar thick.  -She really needs to get out of bed to chair, use IS/Flutter. Nebs BID and prn. 10/14/2022: Sepsis physiology has resolved significantly. 10/15/2022: Sepsis has resolved.  Patient stable for discharge.   Bilateral hilar lymphadenopathy Suspect from underlying infection.  This is seen on the CT scan.  Needs to follow-up outpatient and have a repeat scan to ensure this is nothing else concerning   Proctitis: With significant rectal pain Multiple rectal ulcers Routinely follows with outpatient [facility].  Currently GI is recommending mesalamine twice daily and MiraLAX twice daily.  Minimal improvement in her symptoms, Flex sig showed multiple ulcers/inflammation, biopsy taken. 10/13/2022: See above documentation.  Patient has undergone flexible sigmoidoscopy.  Pathology result is being awaited.  GI is directing care.  Discharge back to skilled nursing facility once cleared for discharge by the GI team. 10/14/2022: See above documentation.  Likely discharge tomorrow.   Hypokalemia/hypocalcemia Replete as needed 10/14/2022: Potassium of 3.5 today.  Will repeat.  10/15/2022: Potassium today is  4.8.   Elevated liver enzymes: Overall stable   Type 2 diabetes: Holding Trulicity.  Sliding scale and Accu-Cheks   Essential hypertension Currently on Norvasc.  IV as needed 10/15/2022: Uncontrolled.  Continue to optimize.   Hyperlipidemia: Resumed statin as LFTs improved.   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   CVA with left-sided weakness: Aspirin and Lipitor   Vaginal bleeding: Seen by GYN.  GC / CT/ trichomoniasis / BV / Candida vaginal swabs-pending   DVT prophylaxis: SCDs Code Status:Full code Family Communication: (No family at bed side) Disposition Plan:     Status is: Inpatient Anticipate discharge back to SNF once her rectal symptoms have improved        Diet Orders (From admission, onward)     Start     Ordered   10/14/22 1421  DIET DYS 2 Room service appropriate? No; Fluid consistency: Thin  Diet effective now       Question Answer Comment  Room service appropriate? No   Fluid consistency: Thin      10/14/22 1421            Subjective: Still having rectal pain and discomfort  Examination: Constitutional: Patient is not in any distress.  Patient is awake and alert.  Patient remains dysarthric.     Respiratory: Decreased air entry.   Cardiovascular: S1-S2.   Abdomen: Nontender nondistended good bowel sounds Musculoskeletal: No edema noted Neurologic: Left-sided hemiplegia.  Dysarthric.  Awake and alert.   EXTR: No leg edema.  Objective: Vitals:   10/15/22 0500 10/15/22 0750 10/15/22 0752 10/15/22 1159  BP:  (!) 164/99  122/75  Pulse: 96 (!) 104  88  Resp: (!) 21 20  (!) 21  Temp:  97.6 F (36.4 C)  97.6 F (36.4 C)  TempSrc:  Oral  Oral  SpO2: 94% 96% 96% 93%  Weight: 68.3 kg     Height:       No intake or output data in the 24 hours ending 10/15/22 1439  Filed Weights   10/13/22 0500 10/14/22 0414 10/15/22 0500  Weight: 67 kg 68.3 kg 68.3 kg    Scheduled Meds:  amLODipine  10 mg Oral Daily   aspirin EC  81 mg Oral  Daily   atorvastatin  80 mg Oral Daily   Chlorhexidine Gluconate Cloth  6 each Topical Daily   ezetimibe  10 mg Oral Daily   FLUoxetine  20 mg Oral Daily   insulin aspart  0-9 Units Subcutaneous TID WC   ipratropium-albuterol  3 mL Nebulization BID   mesalamine  1,000 mg Rectal BID   multivitamin with minerals  1 tablet Oral Daily   pantoprazole  40 mg Oral Daily   polyethylene glycol  17 g Oral Daily   tiZANidine  2 mg Oral BID   Continuous Infusions:     Nutritional status Signs/Symptoms: percent weight loss, moderate muscle depletion, severe muscle depletion Percent weight loss: 22.3 % Interventions: Magic cup, MVI, Refer to RD note for recommendations Body mass index is 25.85 kg/m.  Data Reviewed:   CBC: Recent Labs  Lab 10/11/22 0026 10/11/22 2355 10/12/22 2343 10/14/22 0023 10/15/22 0012  WBC 9.2 7.2 7.1 9.4 8.2  HGB 10.6* 9.8* 9.7* 10.4* 10.5*  HCT 33.0* 30.9* 29.9* 32.4* 32.2*  MCV 78.2* 78.8* 79.3* 79.8* 79.1*  PLT 316 290 293 304 293    Basic Metabolic Panel: Recent Labs  Lab 10/09/22 0015 10/10/22 0024 10/11/22 0026 10/11/22 2355 10/12/22 2343  10/14/22 0023 10/15/22 0013  NA 141   < > 141 141 142 142 141  K 3.1*   < > 3.8 3.4* 3.6 3.5 4.8  CL 110   < > 111 112* 112* 111 110  CO2 22   < > 21* 21* 22 22 22   GLUCOSE 128*   < > 119* 146* 128* 175* 129*  BUN 6*   < > 10 12 11  7* 6*  CREATININE 0.66   < > 0.77 0.82 0.67 0.79 0.72  CALCIUM 9.3   < > 9.7 9.4 9.4 9.6 9.4  MG 1.9   < > 2.1 2.0 1.9 1.9 2.0  PHOS 2.9  --   --   --   --   --  2.7   < > = values in this interval not displayed.    GFR: Estimated Creatinine Clearance: 68.3 mL/min (by C-G formula based on SCr of 0.72 mg/dL). Liver Function Tests: Recent Labs  Lab 10/09/22 0015 10/15/22 0013  AST 23  --   ALT 47*  --   ALKPHOS 89  --   BILITOT 0.4  --   PROT 6.0*  --   ALBUMIN 2.5* 2.5*    No results for input(s): "LIPASE", "AMYLASE" in the last 168 hours. No results for  input(s): "AMMONIA" in the last 168 hours. Coagulation Profile: No results for input(s): "INR", "PROTIME" in the last 168 hours.  Cardiac Enzymes: No results for input(s): "CKTOTAL", "CKMB", "CKMBINDEX", "TROPONINI" in the last 168 hours. BNP (last 3 results) No results for input(s): "PROBNP" in the last 8760 hours. HbA1C: No results for input(s): "HGBA1C" in the last 72 hours. CBG: Recent Labs  Lab 10/14/22 1108 10/14/22 1552 10/14/22 2058 10/15/22 0615 10/15/22 1158  GLUCAP 146* 143* 113* 116* 191*    Lipid Profile: No results for input(s): "CHOL", "HDL", "LDLCALC", "TRIG", "CHOLHDL", "LDLDIRECT" in the last 72 hours. Thyroid Function Tests: No results for input(s): "TSH", "T4TOTAL", "FREET4", "T3FREE", "THYROIDAB" in the last 72 hours. Anemia Panel: No results for input(s): "VITAMINB12", "FOLATE", "FERRITIN", "TIBC", "IRON", "RETICCTPCT" in the last 72 hours. Sepsis Labs: Recent Labs  Lab 10/11/22 0026  PROCALCITON 0.62     No results found for this or any previous visit (from the past 240 hour(s)).        Radiology Studies: DG Swallowing Func-Speech Pathology  Result Date: 10/14/2022 Table formatting from the original result was not included. Modified Barium Swallow Study Patient Details Name: Valeda Cunnane MRN: [id number] Date of Birth: Feb 03, 1961 Today's Date: 10/14/2022 HPI/PMH: HPI: Pt is a 62 y.o. female who was admitted on 6/4 with severe sepsis due to community-acquired pneumonia after presenting with tachycardia which was noted during outpatient GI visit. CT chest 6/4: Bilateral lower lobe ground-glass and patchy lung opacities with bronchial thickening concerning for bilateral lower lobe pneumonia. PMH: type 2 diabetes mellitus, essential hypertension, hyperlipidemia, stroke. MBS 5/26: moderate oral dysphagia due to dentition impacting labial seal and resulting in anterior spillage of liquids. Pharyngeal dysphagia with delayed initaition of swallow even when a  adequate oral bolus hold is achieved. Pt had sensed and silent penetration before the swallow with subsequent aspiration of penetrate when glottic closure is released. A dysphagia 3 diet with nectar thick liquids was recommended at that time. Pt discharged from hospital 5/28 and was on that diet at that time.Repeat MBS for possible upgrade. Clinical Impression: Clinical Impression: Pt demonstrated mild improvements from previous MBS. She has difficulty sealing lips due to projection of anterior dentition resulting  in anterior spill with thin liquid. Decreased oral cohesion and transit of boluses with thin. She continues to exhibit decreased timing of laryngeal closure resulting in penetration and aspiration before laryngeal elevation and closure is achieved with thin liquids (tsp, cup). A supraglottic swallow strategy was ineffective and resulted in aspiration however a chin tuck consistently prevented penetration with cup and straw in 90% of opportunities throughout the study. There was no significant pharyngeal residue and esophageal scan was unremarkable. Pt is currently on a puree diet and recommend upgrade diet texture to Dys 2 as she was able to masticate slowly but effectively. Upgrade to thin liquids with a chin tuck with or without straw use, pills whole in  puree and assist with meals to ensure performance of strategies. Factors that may increase risk of adverse event in presence of aspiration [name] & [name] 2021): Factors that may increase risk of adverse event in presence of aspiration [name] & [name] 2021): Inadequate oral hygiene Recommendations/Plan: Swallowing Evaluation Recommendations Swallowing Evaluation Recommendations Recommendations: PO diet PO Diet Recommendation: Dysphagia 2 (Finely chopped); Thin liquids (Level 0) (thin with chin tuck) Liquid Administration via: Straw Medication Administration: Whole meds with puree Supervision: Patient able to self-feed Swallowing strategies  : Slow  rate; Small bites/sips; Chin tuck (with thin) Postural changes: Position pt fully upright for meals Oral care recommendations: Oral care BID (2x/day) Treatment Plan Treatment Plan Treatment recommendations: Therapy as outlined in treatment plan below Follow-up recommendations: Skilled nursing-short term rehab (<3 hours/day) Functional status assessment: Patient has had a recent decline in their functional status and demonstrates the ability to make significant improvements in function in a reasonable and predictable amount of time. Treatment frequency: Min 2x/week Treatment duration: 2 weeks Interventions: Aspiration precaution training; Compensatory techniques; Patient/family education; Trials of upgraded texture/liquids; Diet toleration management by SLP Recommendations Recommendations for follow up therapy are one component of a multi-disciplinary discharge planning process, led by the attending physician.  Recommendations may be updated based on patient status, additional functional criteria and insurance authorization. Assessment: Orofacial Exam: Orofacial Exam Oral Cavity: Oral Hygiene: WFL Oral Cavity - Dentition: Missing dentition Anatomy: Anatomy: WFL Boluses Administered: Boluses Administered Boluses Administered: Thin liquids (Level 0); Mildly thick liquids (Level 2, nectar thick); Puree; Solid  Oral Impairment Domain: Oral Impairment Domain Lip Closure: Escape beyond mid-chin Tongue control during bolus hold: Posterior escape of less than half of bolus Bolus preparation/mastication: Slow prolonged chewing/mashing with complete recollection Bolus transport/lingual motion: Delayed initiation of tongue motion (oral holding) Oral residue: Trace residue lining oral structures Location of oral residue : Tongue Initiation of pharyngeal swallow : Pyriform sinuses  Pharyngeal Impairment Domain: Pharyngeal Impairment Domain Soft palate elevation: No bolus between soft palate (SP)/pharyngeal wall (PW) Laryngeal  elevation: Partial superior movement of thyroid cartilage/partial approximation of arytenoids to epiglottic petiole Anterior hyoid excursion: Partial anterior movement Epiglottic movement: Complete inversion Laryngeal vestibule closure: Incomplete, narrow column air/contrast in laryngeal vestibule Pharyngeal stripping wave : Present - complete Pharyngeal contraction (A/P view only): N/A Pharyngoesophageal segment opening: Complete distension and complete duration, no obstruction of flow Tongue base retraction: Trace column of contrast or air between tongue base and PPW Pharyngeal residue: Complete pharyngeal clearance  Esophageal Impairment Domain: Esophageal Impairment Domain Esophageal clearance upright position: Complete clearance, esophageal coating Pill: No data recorded Penetration/Aspiration Scale Score: Penetration/Aspiration Scale Score 1.  Material does not enter airway: Solid; Puree 3.  Material enters airway, remains ABOVE vocal cords and not ejected out: Mildly thick liquids (Level 2, nectar thick) 7.  Material enters airway, passes BELOW cords and not ejected out despite cough attempt by patient: Thin liquids (Level 0) Compensatory Strategies: Compensatory Strategies Compensatory strategies: Yes Straw: Effective Effective Straw: Thin liquid (Level 0) (with chin tuck) Chin tuck: Effective Effective Chin Tuck: Thin liquid (Level 0) Supraglottic swallow: Ineffective Ineffective Supraglottic Swallow: Thin liquid (Level 0)   General Information: Caregiver present: No  Diet Prior to this Study: Other (Comment) (full liquids nectar thidck)   Temperature : Normal   Respiratory Status: WFL   Supplemental O2: Nasal cannula   History of Recent Intubation: No  Behavior/Cognition: Alert; Cooperative; Pleasant mood Self-Feeding Abilities: Able to self-feed Baseline vocal quality/speech: Normal Volitional Cough: Able to elicit Volitional Swallow: Able to elicit No data recorded Goal Planning: Prognosis for improved  oropharyngeal function: Good No data recorded No data recorded No data recorded Consulted and agree with results and recommendations: Patient Pain: Pain Assessment Pain Assessment: Faces Faces Pain Scale: 0 End of Session: Start Time:SLP Start Time (ACUTE ONLY): 1213 Stop Time: SLP Stop Time (ACUTE ONLY): 1234 Time Calculation:SLP Time Calculation (min) (ACUTE ONLY): 21 min Charges: SLP Evaluations $ SLP Speech Visit: 1 Visit SLP Evaluations $MBS Swallow: 1 Procedure $Swallowing Treatment: 1 Procedure SLP visit diagnosis: SLP Visit Diagnosis: Dysphagia, oropharyngeal phase (R13.12) Past Medical History: Past Medical History: Diagnosis Date  Abnormality of gait 05/10/2010  BACK PAIN 11/14/2008  Class 1 obesity due to excess calories with body mass index (BMI) of 31.0 to 31.9 in adult 02/07/2021  DIABETES MELLITUS, TYPE II 07/15/2008  Diplopia 07/15/2008  ECZEMA, ATOPIC 04/03/2009  GERD (gastroesophageal reflux disease)   Guillain-Barre (HCC) 1988  HYPERLIPIDEMIA 03/06/2009  HYPERTENSION 07/15/2008  Stroke (HCC) 2010, 2011  x2   Vertebral artery stenosis  Past Surgical History: Past Surgical History: Procedure Laterality Date  ABDOMINAL HYSTERECTOMY    BIOPSY  07/08/2022  Procedure: BIOPSY;  Surgeon: Benancio Deeds, MD;  Location: [facility];  Service: Gastroenterology;;  COLONOSCOPY WITH PROPOFOL N/A 07/08/2022  Procedure: COLONOSCOPY WITH PROPOFOL;  Surgeon: Benancio Deeds, MD;  Location: [facility];  Service: Gastroenterology;  Laterality: N/A;  DILATION AND CURETTAGE OF UTERUS    FOOT SURGERY    IR ANGIO INTRA EXTRACRAN SEL COM CAROTID INNOMINATE UNI L MOD SED  09/17/2021  IR ANGIO INTRA EXTRACRAN SEL INTERNAL CAROTID UNI R MOD SED  09/17/2021  IR ANGIO VERTEBRAL SEL VERTEBRAL UNI R MOD SED  09/17/2021  IR US GUIDE VASC ACCESS RIGHT  09/17/2021 Royce Macadamia 10/14/2022, 1:48 PM          LOS: 11 days   Time spent= 35 mins    Barnetta Chapel, MD [facility]  If 7PM-7AM,  please contact night-coverage  10/15/2022, 2:39 PM

## 2022-10-15 NOTE — TOC Progression Note (Addendum)
Transition of Care [facility]) - Progression Note    Patient Details  Name: Lenda Mortensen MRN: [id number] Date of Birth: [birth year]  [facility] CM/SW Contact  Delilah Shan, LCSWA Phone Number: 10/15/2022, 11:33 AM  Clinical Narrative:     Update- CSW called facility back. Facility informed CSW will need to follow back up with Bri[facility] on Monday no one in admissions until then. CSW will follow back up with Cheyenne Adas on Monday. CSW informed MD.  CSW LVM with [place] with [employer]. CSW awaiting call back. Patient has SN[facility] bed at [facility] when medically ready. CSW will continue to follow and assist with patients dc planning needs.  Expected Discharge Plan: Skilled Nursing Facility Barriers to Discharge: Continued Medical Work up  Expected Discharge Plan and Services                                               Social Determinants of Health (SDOH) Interventions SDOH Screenings   Food Insecurity: No Food Insecurity (09/24/2022)  Housing: Low Risk  (09/24/2022)  Transportation Needs: No Transportation Needs (09/24/2022)  Utilities: Not At Risk (09/24/2022)  Depression (PHQ2-9): Low Risk  (03/16/2022)  Tobacco Use: Low Risk  (10/12/2022)    Readmission Risk Interventions     No data to display

## 2022-10-15 NOTE — Progress Notes (Signed)
Mobility Specialist: Progress Note   10/15/22 1558  Mobility  Activity Transferred from chair to bed  Level of Assistance Maximum assist, patient does 25-49%  Assistive Device Stedy  Activity Response Tolerated well  Mobility Referral Yes  $Mobility charge 1 Mobility  Mobility Specialist Start Time (ACUTE ONLY) 1527  Mobility Specialist Stop Time (ACUTE ONLY) 1557  Mobility Specialist Time Calculation (min) (ACUTE ONLY) 30 min   During Mobility: 151 HR, 97% SpO2 Post-Mobility: 105 HR, 93% SpO2  Pt received in the chair and assisted back to bed per request. MaxA to stand. Assisted pt with pericare d/t BM in the chair. No c/o throughout. Pt back in bed with call bell and phone in reach. Bed alarm is on.   Jany Buckwalter Mobility Specialist Please contact via SecureChat or Rehab office at [phone]

## 2022-10-16 ENCOUNTER — Encounter (HOSPITAL_COMMUNITY): Payer: Self-pay | Admitting: Gastroenterology

## 2022-10-16 DIAGNOSIS — J189 Pneumonia, unspecified organism: Secondary | ICD-10-CM | POA: Diagnosis not present

## 2022-10-16 LAB — CBC
HCT: 30.8 % — ABNORMAL LOW (ref 36.0–46.0)
Hemoglobin: 9.9 g/dL — ABNORMAL LOW (ref 12.0–15.0)
MCH: 25.2 pg — ABNORMAL LOW (ref 26.0–34.0)
MCHC: 32.1 g/dL (ref 30.0–36.0)
MCV: 78.4 fL — ABNORMAL LOW (ref 80.0–100.0)
Platelets: 291 10*3/uL (ref 150–400)
RBC: 3.93 MIL/uL (ref 3.87–5.11)
RDW: 16.1 % — ABNORMAL HIGH (ref 11.5–15.5)
WBC: 6 10*3/uL (ref 4.0–10.5)
nRBC: 0 % (ref 0.0–0.2)

## 2022-10-16 LAB — BASIC METABOLIC PANEL
Anion gap: 7 (ref 5–15)
BUN: 7 mg/dL — ABNORMAL LOW (ref 8–23)
CO2: 22 mmol/L (ref 22–32)
Calcium: 9.2 mg/dL (ref 8.9–10.3)
Chloride: 108 mmol/L (ref 98–111)
Creatinine, Ser: 0.77 mg/dL (ref 0.44–1.00)
GFR, Estimated: >60 mL/min (ref >60)
Glucose, Bld: 101 mg/dL — ABNORMAL HIGH (ref 70–99)
Potassium: 3.7 mmol/L (ref 3.5–5.1)
Sodium: 137 mmol/L (ref 135–145)

## 2022-10-16 LAB — GLUCOSE, CAPILLARY
Glucose-Capillary: 81 mg/dL (ref 70–99)
Glucose-Capillary: 181 mg/dL — ABNORMAL HIGH (ref 70–99)
Glucose-Capillary: 71 mg/dL (ref 70–99)
Glucose-Capillary: 73 mg/dL (ref 70–99)

## 2022-10-16 LAB — MAGNESIUM: Magnesium: 2 mg/dL (ref 1.7–2.4)

## 2022-10-16 NOTE — Progress Notes (Signed)
PROGRESS NOTE    [name]  ZOX:[id number] DOB: December 21, 1960 DOA: 10/04/2022 PCP: [place], [name], MD   Brief Narrative:  Patient is a 62 year old African-American female with past medical history significant for DM2, HTN, HLD, CVA with residual left-sided weakness/hemiparesis.  Patient presented with shortness of breath.  Patient was recently hospitalized for aspiration pneumonia/proctitis, completed a course of Unasyn, and was discharged on mesalamine suppository.  Patient was seen at the gastroenterology office, was noted to be tachycardic.  Patient was therefore sent to the ER.  CTA chest was negative for PE, but did show bilateral multifocal opacity.  CT of the abdomen pelvis was negative.  Patient was admitted with sepsis due to recurrent aspiration pneumonia/proctitis on empiric Rocephin, azithromycin and Flagyl.  Antibiotics were initially transitioned to Rocephin, and now on Augmentin.  Patient will complete course of oral Augmentin on 10/15/2022 (as per collateral information).  Patient underwent Flex that revealed multiple ulcers involving the rectosigmoid area.  Biopsies were taken and pathology revealed proctocolitis.  Patient is currently on mesalamine suppository.  Patient is significantly dysarthric.  Patient is awaiting discharge to facility.  Assessment & Plan:  Principal Problem:   CAP (community acquired pneumonia) Active Problems:   Essential hypertension   DM2 (diabetes mellitus, type 2) (HCC)   HLD (hyperlipidemia)   Rectal pain   Severe sepsis (HCC)   Dehydration   Acute prerenal azotemia   Proctitis   Hypercalcemia   Transaminitis   Pyuria   Depression   Protein-calorie malnutrition, severe     Severe sepsis secondary to recurrent aspiration pneumonia: -Initially showing signs of sepsis, but not resolved. -CTA showed bilateral multifocal opacities.  Procalcitonin 0.7, BNP negative.   -Chest x-ray showed bronchopneumonia with atelectasis. - Patient has  completed course of antibiotics.  - Cultures remain negative.  Legionella/strep-negative -Speech and swallow-regular diet, nectar thick.  -Needs to get out of bed to chair, use IS/Flutter. Nebs BID and prn.    Bilateral hilar lymphadenopathy -Suspect from underlying infection. -Seen on the CT scan.   -Needs to follow-up outpatient and have a repeat scan to ensure this is nothing else concerning   Proctocolitis/Multiple rectal ulcers:  -Initially with significant rectal pain -Routinely follows with outpatient [facility].   -Flex sig showed multiple ulcers/inflammation, biopsy revealed proctocolitis. -Suppository mesalamine advised by GI.   Hypokalemia/hypocalcemia Replete as needed 10/14/2022: Potassium of 3.5 today.  Will repeat.   10/15/2022: Potassium today is 4.8.   Elevated liver enzymes: Overall stable   Type 2 diabetes: Holding Trulicity.  Sliding scale and Accu-Cheks   Essential hypertension Currently on Norvasc.  IV as needed 10/15/2022: Uncontrolled.  Continue to optimize.   Hyperlipidemia: Resumed statin as LFTs improved.   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   CVA with left-sided weakness: Aspirin and Lipitor   Vaginal bleeding: Seen by GYN.  GC / CT/ trichomoniasis / BV / Candida vaginal swabs-pending   DVT prophylaxis: SCDs Code Status:Full code Family Communication: (No family at bed side) Disposition Plan:     Status is: Inpatient Anticipate discharge back to SNF once her rectal symptoms have improved        Diet Orders (From admission, onward)     Start     Ordered   10/14/22 1421  DIET DYS 2 Room service appropriate? No; Fluid consistency: Thin  Diet effective now       Question Answer Comment  Room service appropriate? No   Fluid consistency: Thin      10/14/22 1421  Subjective: Still having rectal pain and discomfort  Examination: Constitutional: Patient is not in any distress.  Patient is awake and alert.  Patient  remains dysarthric.     Respiratory: Decreased air entry.   Cardiovascular: S1-S2.   Abdomen: Nontender nondistended good bowel sounds Musculoskeletal: No edema noted Neurologic: Left-sided hemiplegia.  Dysarthric.  Awake and alert.   EXTR: No leg edema.  Objective: Vitals:   10/16/22 0500 10/16/22 0741 10/16/22 0801 10/16/22 1026  BP:  (!) 162/89  (!) 135/90  Pulse:  100    Resp:  (!) 22    Temp:  97.7 F (36.5 C)    TempSrc:  Oral    SpO2:   95%   Weight: 67 kg     Height:        Intake/Output Summary (Last 24 hours) at 10/16/2022 1611 Last data filed at 10/16/2022 1200 Gross per 24 hour  Intake 120 ml  Output --  Net 120 ml    Filed Weights   10/14/22 0414 10/15/22 0500 10/16/22 0500  Weight: 68.3 kg 68.3 kg 67 kg    Scheduled Meds:  amLODipine  10 mg Oral Daily   aspirin EC  81 mg Oral Daily   atorvastatin  80 mg Oral Daily   ezetimibe  10 mg Oral Daily   FLUoxetine  20 mg Oral Daily   insulin aspart  0-9 Units Subcutaneous TID WC   ipratropium-albuterol  3 mL Nebulization BID   mesalamine  1,000 mg Rectal BID   multivitamin with minerals  1 tablet Oral Daily   pantoprazole  40 mg Oral Daily   polyethylene glycol  17 g Oral Daily   tiZANidine  2 mg Oral BID   Continuous Infusions:     Nutritional status Signs/Symptoms: percent weight loss, moderate muscle depletion, severe muscle depletion Percent weight loss: 22.3 % Interventions: Magic cup, MVI, Refer to RD note for recommendations Body mass index is 25.35 kg/m.  Data Reviewed:   CBC: Recent Labs  Lab 10/11/22 2355 10/12/22 2343 10/14/22 0023 10/15/22 0012 10/16/22 0018  WBC 7.2 7.1 9.4 8.2 6.0  HGB 9.8* 9.7* 10.4* 10.5* 9.9*  HCT 30.9* 29.9* 32.4* 32.2* 30.8*  MCV 78.8* 79.3* 79.8* 79.1* 78.4*  PLT 290 293 304 293 291    Basic Metabolic Panel: Recent Labs  Lab 10/11/22 2355 10/12/22 2343 10/14/22 0023 10/15/22 0013 10/16/22 0018  NA 141 142 142 141 137  K 3.4* 3.6 3.5 4.8 3.7   CL 112* 112* 111 110 108  CO2 21* 22 22 22 22   GLUCOSE 146* 128* 175* 129* 101*  BUN 12 11 7* 6* 7*  CREATININE 0.82 0.67 0.79 0.72 0.77  CALCIUM 9.4 9.4 9.6 9.4 9.2  MG 2.0 1.9 1.9 2.0 2.0  PHOS  --   --   --  2.7  --     GFR: Estimated Creatinine Clearance: 67.7 mL/min (by C-G formula based on SCr of 0.77 mg/dL). Liver Function Tests: Recent Labs  Lab 10/15/22 0013  ALBUMIN 2.5*    No results for input(s): "LIPASE", "AMYLASE" in the last 168 hours. No results for input(s): "AMMONIA" in the last 168 hours. Coagulation Profile: No results for input(s): "INR", "PROTIME" in the last 168 hours.  Cardiac Enzymes: No results for input(s): "CKTOTAL", "CKMB", "CKMBINDEX", "TROPONINI" in the last 168 hours. BNP (last 3 results) No results for input(s): "PROBNP" in the last 8760 hours. HbA1C: No results for input(s): "HGBA1C" in the last 72 hours. CBG: Recent Labs  Lab 10/15/22 1158 10/15/22 1620 10/15/22 2126 10/16/22 0609 10/16/22 1132  GLUCAP 191* 131* 71 81 181*    Lipid Profile: No results for input(s): "CHOL", "HDL", "LDLCALC", "TRIG", "CHOLHDL", "LDLDIRECT" in the last 72 hours. Thyroid Function Tests: No results for input(s): "TSH", "T4TOTAL", "FREET4", "T3FREE", "THYROIDAB" in the last 72 hours. Anemia Panel: No results for input(s): "VITAMINB12", "FOLATE", "FERRITIN", "TIBC", "IRON", "RETICCTPCT" in the last 72 hours. Sepsis Labs: Recent Labs  Lab 10/11/22 0026  PROCALCITON 0.62     No results found for this or any previous visit (from the past 240 hour(s)).        Radiology Studies: No results found.         LOS: 12 days   Time spent= 35 mins    Barnetta Chapel, MD [facility]  If 7PM-7AM, please contact night-coverage  10/16/2022, 4:11 PM

## 2022-10-16 NOTE — Progress Notes (Signed)
Mobility Specialist: Progress Note   10/16/22 1050  Mobility  Activity Stood at bedside (x4)  Level of Assistance Maximum assist, patient does 25-49%  Assistive Device Stedy  Activity Response Tolerated fair  Mobility Referral Yes  $Mobility charge 1 Mobility  Mobility Specialist Start Time (ACUTE ONLY) 1005  Mobility Specialist Stop Time (ACUTE ONLY) 1045  Mobility Specialist Time Calculation (min) (ACUTE ONLY) 40 min   Pre-Mobility on 2 L/min [place]: 98 HR, 98% SpO2 During Mobility on RA: 150 HR, 98% SpO2 Post-Mobility on RA: 111 HR, 98% SpO2  Pt received in the bed and agreeable to mobility. Assisted pt with pericare d/t bowel and urinary incontinence. MaxA with bed mobility as well as to stand. C/o BUE soreness and general fatigue. Able to stand x2 from bed and x2 from [facility] flaps. Pt bowel incontinent when standing and assisted with pericare with help from RN and NT. Pt back to bed at end of session with RN and NT present in the room.   Susane Bey Mobility Specialist Please contact via SecureChat or Rehab office at [phone]

## 2022-10-17 DIAGNOSIS — J189 Pneumonia, unspecified organism: Secondary | ICD-10-CM | POA: Diagnosis not present

## 2022-10-17 LAB — GLUCOSE, CAPILLARY
Glucose-Capillary: 73 mg/dL (ref 70–99)
Glucose-Capillary: 150 mg/dL — ABNORMAL HIGH (ref 70–99)
Glucose-Capillary: 74 mg/dL (ref 70–99)
Glucose-Capillary: 116 mg/dL — ABNORMAL HIGH (ref 70–99)

## 2022-10-17 MED ORDER — LANTUS SOLOSTAR 100 UNIT/ML ~~LOC~~ SOPN: 5.0000 [IU] | PEN_INJECTOR | Freq: Every day | SUBCUTANEOUS | 11 refills | Status: DC

## 2022-10-17 MED ORDER — ENOXAPARIN SODIUM 40 MG/0.4ML IJ SOSY
40.0000 mg | PREFILLED_SYRINGE | Freq: Every day | INTRAMUSCULAR | Status: DC
  Administered 2022-10-17: 40 mg via SUBCUTANEOUS
  Filled 2022-10-17: qty 0.4

## 2022-10-17 MED ORDER — MESALAMINE 1000 MG RE SUPP: 1000.0000 mg | Freq: Two times a day (BID) | RECTAL | 12 refills | Status: DC

## 2022-10-17 MED ORDER — SENNOSIDES-DOCUSATE SODIUM 8.6-50 MG PO TABS: 1.0000 | ORAL_TABLET | Freq: Every evening | ORAL | 0 refills | Status: AC | PRN

## 2022-10-17 MED ORDER — PANTOPRAZOLE SODIUM 40 MG PO TBEC: 40.0000 mg | DELAYED_RELEASE_TABLET | Freq: Every day | ORAL | 0 refills | Status: AC

## 2022-10-17 NOTE — Progress Notes (Signed)
Speech Language Pathology Treatment: Dysphagia  Patient Details Name: [name] MRN: [id number] DOB: 07-14-1960 Today's Date: 10/17/2022 Time: 0911-0922 SLP Time Calculation (min) (ACUTE ONLY): 11 min  Assessment / Plan / Recommendation Clinical Impression  Pt independently recalled chin tuck and able to perform needing intermittent min reminders for small sips but the chin tuck is tight to chest. With chin tuck using straw she did not cough, throat clear and her voice remained clear throughout session. Mastication of Dys 2 she has some difficulty due to protruding dentition and cannot adduct lips when chewing with minimal residue that she reduced with liquid wash and SLP using toothette to remove. Pt states she does not want to downgrade to puree texture. Discussed pills with RN to crush large ones and give smaller ones in applesauce or pudding. ST will continue to follow.    HPI HPI: Pt is a 62 y.o. female who was admitted on 6/4 with severe sepsis due to community-acquired pneumonia after presenting with tachycardia which was noted during outpatient GI visit. CT chest 6/4: Bilateral lower lobe ground-glass and patchy lung opacities with bronchial thickening concerning for bilateral lower lobe pneumonia. PMH: type 2 diabetes mellitus, essential hypertension, hyperlipidemia, stroke. MBS 5/26: moderate oral dysphagia due to dentition impacting labial seal and resulting in anterior spillage of liquids. Pharyngeal dysphagia with delayed initaition of swallow even when a adequate oral bolus hold is achieved. Pt had sensed and silent penetration before the swallow with subsequent aspiration of penetrate when glottic closure is released. A dysphagia 3 diet with nectar thick liquids was recommended at that time. Pt discharged from hospital 5/28 and was on that diet at that time.Repeat MBS for possible upgrade.      SLP Plan  Continue with current plan of care      Recommendations for follow up  therapy are one component of a multi-disciplinary discharge planning process, led by the attending physician.  Recommendations may be updated based on patient status, additional functional criteria and insurance authorization.    Recommendations  Diet recommendations: Dysphagia 2 (fine chop);Thin liquid Liquids provided via: Cup;Straw Medication Administration:  (crush large pills, small in puree) Supervision: Staff to assist with self feeding Compensations: Slow rate;Small sips/bites;Lingual sweep for clearance of pocketing;Chin tuck (with thin) Postural Changes and/or Swallow Maneuvers: Seated upright 90 degrees                  Oral care BID   Intermittent Supervision/Assistance Dysphagia, oropharyngeal phase (R13.12)     Continue with current plan of care     Royce Macadamia  10/17/2022, 9:28 AM

## 2022-10-17 NOTE — Progress Notes (Signed)
Physical Therapy Treatment Patient Details Name: [name] MRN: [id number] DOB: [birth year] Today's Date: 10/17/2022   History of Present Illness Pt is a 62 y.o. female who was admitted on 6/4 with severe sepsis due to community-acquired pneumonia after presenting with tachycardia which was noted during outpatient GI visit. CT chest 6/4: Bilateral lower lobe ground-glass and patchy lung opacities with bronchial thickening concerning for bilateral lower lobe pneumonia. +painful rectum/proctitis with sigmoidoscopy and biopsies. 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    PT Comments    Pt received in supine, agreeable to therapy session, pt noted to have bowel incontinence and needing modA to maxA for rolling to L/R sides with use of bed rail to assist. Pt performed bed mobility and transfers with mod to maxA and +2 assist for safety to stand in [place] lift. Pt totalA for bed>chair via Stedy and upon standing from Stedy flaps to sit in chair, pt HR tachy to 148-149 bpm, decreased once pt seated in recliner. Pt with good effort for all tasks, defer additional standing/gait trials due to tachycardia and pt c/o fatigue once up in chair. Pt continues to benefit from PT services to progress toward functional mobility goals.   Recommendations for follow up therapy are one component of a multi-disciplinary discharge planning process, led by the attending physician.  Recommendations may be updated based on patient status, additional functional criteria and insurance authorization.  Follow Up Recommendations  Can patient physically be transported by private vehicle: No    Assistance Recommended at Discharge Frequent or constant Supervision/Assistance  Patient can return home with the following Two people to help with walking and/or transfers;Two people to help with bathing/dressing/bathroom   Equipment  Recommendations  None recommended by PT    Recommendations for Other Services       Precautions / Restrictions Precautions Precautions: Fall Precaution Comments: bowel/bladder incontinence Restrictions Weight Bearing Restrictions: No     Mobility  Bed Mobility Overal bed mobility: Needs Assistance Bed Mobility: Rolling, Sidelying to Sit Rolling: Mod assist Sidelying to sit: +2 for safety/equipment, HOB elevated, Max assist       General bed mobility comments: cues for technique, assist for LEs over EOB and to raise trunk, pt needing increased assist to sit up on L EOB due to LLE weakness    Transfers Overall transfer level: Needs assistance Equipment used: Ambulation equipment used Transfers: Sit to/from Stand Sit to Stand: Mod assist, +2 physical assistance, From elevated surface           General transfer comment: stand +2 modA from elevated EOB with stedy; from flaps of stedy x 1 rep with modA and HR elevated to higher 140's, defer additional standing trials due to pt c/o fatigue and tachycardia. Transfer via Lift Equipment: Environmental manager [place] (Stroke Patients Only)       Balance Overall balance assessment: Needs assistance Sitting-balance support: Feet supported, Single extremity supported Sitting balance-Leahy Scale: Poor Sitting balance - Comments: initial support due to posterior and left lean; progressed to min guard with pt using end of bed rail for RUE support   Standing balance support: Bilateral upper extremity supported, During functional activity Standing balance-Leahy Scale: Poor Standing balance comment: +2  Cognition Arousal/Alertness: Awake/alert Behavior During Therapy: WFL for tasks assessed/performed Overall Cognitive Status: History of cognitive impairments - at baseline                                  General Comments: follows 1 step commands consistently, needs extra time        Exercises Other Exercises Other Exercises: left heel cord stretch and donned PRAFO in recliner to promote neutral hip and ankle posture.    General Comments General comments (skin integrity, edema, etc.): HR ~108 bpm resting, up to 148 bpm with standing from [place]. No other acute s/sx distress other than c/o fatigue and tachycardia.      Pertinent Vitals/Pain Pain Assessment Pain Assessment: Faces Faces Pain Scale: Hurts a little bit Pain Location: rectum with peri care Pain Descriptors / Indicators: Discomfort Pain Intervention(s): Monitored during session, Repositioned, Other (comment) (improved after hygiene assist with pillow under hips in chair)     PT Goals (current goals can now be found in the care plan section) Acute Rehab PT Goals Patient Stated Goal: get back to [facility] PT Goal Formulation: With patient Time For Goal Achievement: 10/24/22 Progress towards PT goals: Progressing toward goals    Frequency    Min 2X/week      PT Plan Current plan remains appropriate       AM-PAC PT "6 Clicks" Mobility   Outcome Measure  Help needed turning from your back to your side while in a flat bed without using bedrails?: A Lot Help needed moving from lying on your back to sitting on the side of a flat bed without using bedrails?: Total Help needed moving to and from a bed to a chair (including a wheelchair)?: Total Help needed standing up from a chair using your arms (e.g., wheelchair or bedside chair)?: A Lot Help needed to walk in hospital room?: Total Help needed climbing 3-5 steps with a railing? : Total 6 Click Score: 8    End of Session Equipment Utilized During Treatment: Gait belt;Oxygen Activity Tolerance: Patient tolerated treatment well;Treatment limited secondary to medical complications (Comment);Other (comment) (tachycardia with standing trial) Patient  left: in chair;with call bell/phone within reach;with chair alarm set Nurse Communication: Mobility status;Need for lift equipment;Other (comment) (tachy; pt asking for assist to order dinner, NT notified) PT Visit Diagnosis: Other abnormalities of gait and mobility (R26.89);Muscle weakness (generalized) (M62.81);Hemiplegia and hemiparesis Hemiplegia - Right/Left: Left Hemiplegia - dominant/non-dominant: Non-dominant Hemiplegia - caused by: Cerebral infarction     Time: 4098-1191 PT Time Calculation (min) (ACUTE ONLY): 28 min  Charges:  $Therapeutic Activity: 23-37 mins                     Tierany Appleby P., PTA Acute Rehabilitation Services Secure Chat Preferred 9a-5:30pm Office: [phone]    Dorathy Kinsman [facility] 10/17/2022, 4:50 PM

## 2022-10-17 NOTE — Progress Notes (Signed)
Lovenox consulted for VTE prophylaxis.  CrCl>30 ml/min.  Lovenox 40mg  SQ qday  Ulyses Southward, PharmD, BCIDP, AAHIVP, CPP Infectious Disease Pharmacist 10/17/2022 2:21 PM

## 2022-10-17 NOTE — Progress Notes (Signed)
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  Patient presented with shortness of breath.  Patient was recently hospitalized for aspiration pneumonia/proctitis, completed a course of Unasyn, and was discharged on mesalamine suppository.  Patient was seen at the gastroenterology office, was noted to be tachycardic.  Patient was therefore sent to the ER.  CTA chest was negative for PE, but did show bilateral multifocal opacity.  CT of the abdomen pelvis was negative.  Patient was admitted with sepsis due to recurrent aspiration pneumonia/proctitis on empiric Rocephin, azithromycin and Flagyl.  Antibiotics were initially transitioned to Rocephin, and now on Augmentin.  Patient will complete course of oral Augmentin on 10/15/2022 (as per collateral information).  Patient underwent Flex that revealed multiple ulcers involving the rectosigmoid area.  Biopsies were taken and pathology revealed proctocolitis.  Patient is currently on mesalamine suppository.  Patient is significantly dysarthric.  Patient is awaiting discharge to facility.   Assessment & Plan:   Principal Problem:   CAP (community acquired pneumonia) Active Problems:   Essential hypertension   DM2 (diabetes mellitus, type 2) (HCC)   HLD (hyperlipidemia)   Rectal pain   Severe sepsis (HCC)   Dehydration   Acute prerenal azotemia   Proctitis   Hypercalcemia   Transaminitis   Pyuria   Depression   Protein-calorie malnutrition, severe  Severe sepsis secondary to recurrent aspiration pneumonia: -Initially showing signs of sepsis, but now resolved. - Patient has completed course of antibiotics.  - Cultures remain negative.  Legionella/strep-negative -Speech and swallow-regular diet,  nectar thick.  Continue IS/Flutter. Nebs BID and prn.   Bilateral hilar lymphadenopathy -Suspect from underlying infection. -Seen on the CT scan.   -Needs to follow-up outpatient and have a repeat scan to ensure this is nothing else concerning   Proctocolitis/Multiple rectal ulcers:  -Initially with significant rectal pain -Routinely follows with outpatient [facility].   -Flex sig showed multiple ulcers/inflammation, biopsy revealed proctocolitis. -Suppository mesalamine advised by GI.   Hypokalemia/hypocalcemia Resolved.   Elevated liver enzymes: Overall stable   Type 2 diabetes: Holding Trulicity.  Sliding scale and Accu-Cheks.  Blood sugar controlled.   Essential hypertension Currently on Norvasc.  IV hydralazine as needed   Hyperlipidemia: Resumed statin as LFTs improved.   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   CVA with left-sided weakness: Aspirin and Lipitor   Vaginal bleeding: Seen by GYN.  No more bleeding reported.  DVT prophylaxis: SCDs Start: 10/04/22 2027   Code Status: Full Code  Family Communication:  None present at bedside.   Status is: Inpatient Remains inpatient appropriate because: Patient medically stable.  Needs to be discharged back to SNF.  TOC is aware.   Estimated body mass index is 25.47 kg/m as calculated from the following:   Height as of this encounter: 5\' 4"  (1.626 m).   Weight as of this encounter: 67.3 kg.    Nutritional Assessment: Body mass index is 25.47 kg/m.[name] Seen by dietician.  I agree with the assessment and plan as outlined below: Nutrition Status: Nutrition Problem: Severe Malnutrition Etiology: chronic illness (CVA) Signs/Symptoms: percent weight loss, moderate muscle depletion, severe muscle depletion Percent weight loss: 22.3 % Interventions: Magic cup, MVI, Refer to RD note for recommendations  . Skin Assessment: I have examined the patient's skin and I agree with  the wound assessment as performed by the  wound care RN as outlined below:    Consultants:  None  Procedures:  None  Antimicrobials:  Anti-infectives (From admission, onward)    Start     Dose/Rate Route Frequency Ordered Stop   10/10/22 1000  amoxicillin-clavulanate (AUGMENTIN) 875-125 MG per tablet 1 tablet        1 tablet Oral Every 12 hours 10/10/22 0740 10/14/22 2159   10/06/22 2000  azithromycin (ZITHROMAX) tablet 500 mg  Status:  Discontinued        500 mg Oral Every 24 hours 10/06/22 1336 10/07/22 1221   10/05/22 1400  cefTRIAXone (ROCEPHIN) 1 g in sodium chloride 0.9 % 100 mL IVPB  Status:  Discontinued        1 g 200 mL/hr over 30 Minutes Intravenous Every 24 hours 10/04/22 2030 10/10/22 0740   10/05/22 0800  metroNIDAZOLE (FLAGYL) IVPB 500 mg  Status:  Discontinued        500 mg 100 mL/hr over 60 Minutes Intravenous Every 12 hours 10/04/22 2030 10/07/22 1221   10/04/22 2045  azithromycin (ZITHROMAX) 500 mg in sodium chloride 0.9 % 250 mL IVPB  Status:  Discontinued        500 mg 250 mL/hr over 60 Minutes Intravenous Every 24 hours 10/04/22 2030 10/06/22 1336   10/04/22 2000  cefTRIAXone (ROCEPHIN) 2 g in sodium chloride 0.9 % 100 mL IVPB        2 g 200 mL/hr over 30 Minutes Intravenous  Once 10/04/22 1956 10/04/22 2111   10/04/22 2000  metroNIDAZOLE (FLAGYL) IVPB 500 mg        500 mg 100 mL/hr over 60 Minutes Intravenous  Once 10/04/22 1956 10/04/22 2150         Subjective: Patient seen and examined.  She has dysarthria.  She was complaining of neck pain which is chronic for her.  Objective: Vitals:   10/17/22 0500 10/17/22 0755 10/17/22 0920 10/17/22 1104  BP:  128/88  112/73  Pulse:  (!) 104    Resp:  20  (!) 23  Temp:  97.8 F (36.6 C)  98.7 F (37.1 C)  TempSrc:  Oral  Oral  SpO2:  97% 97% 97%  Weight: 67.3 kg     Height:        Intake/Output Summary (Last 24 hours) at 10/17/2022 1402 Last data filed at 10/17/2022 1200 Gross per 24 hour  Intake 240 ml  Output --  Net 240 ml   Filed  Weights   10/15/22 0500 10/16/22 0500 10/17/22 0500  Weight: 68.3 kg 67 kg 67.3 kg    Examination:  General exam: Appears calm and comfortable  Respiratory system: Clear to auscultation. Respiratory effort normal. Cardiovascular system: S1 & S2 heard, RRR. No JVD, murmurs, rubs, gallops or clicks. No pedal edema. Gastrointestinal system: Abdomen is nondistended, soft and nontender. No organomegaly or masses felt. Normal bowel sounds heard. Central nervous system: Alert and oriented x 2.  Dysarthria.  Left hemiplegia. \ Data Reviewed: I have personally reviewed following labs and imaging studies  CBC: Recent Labs  Lab 10/11/22 2355 10/12/22 2343 10/14/22 0023 10/15/22 0012 10/16/22 0018  WBC 7.2 7.1 9.4 8.2 6.0  HGB 9.8* 9.7* 10.4* 10.5* 9.9*  HCT 30.9* 29.9* 32.4* 32.2* 30.8*  MCV 78.8* 79.3* 79.8* 79.1* 78.4*  PLT 290 293 304 293 291   Basic Metabolic Panel: Recent Labs  Lab 10/11/22 2355 10/12/22 2343 10/14/22 0023 10/15/22 0013 10/16/22 0018  NA 141  142 142 141 137  K 3.4* 3.6 3.5 4.8 3.7  CL 112* 112* 111 110 108  CO2 21* 22 22 22 22   GLUCOSE 146* 128* 175* 129* 101*  BUN 12 11 7* 6* 7*  CREATININE 0.82 0.67 0.79 0.72 0.77  CALCIUM 9.4 9.4 9.6 9.4 9.2  MG 2.0 1.9 1.9 2.0 2.0  PHOS  --   --   --  2.7  --    GFR: Estimated Creatinine Clearance: 67.8 mL/min (by C-G formula based on SCr of 0.77 mg/dL). Liver Function Tests: Recent Labs  Lab 10/15/22 0013  ALBUMIN 2.5*   No results for input(s): "LIPASE", "AMYLASE" in the last 168 hours. No results for input(s): "AMMONIA" in the last 168 hours. Coagulation Profile: No results for input(s): "INR", "PROTIME" in the last 168 hours. Cardiac Enzymes: No results for input(s): "CKTOTAL", "CKMB", "CKMBINDEX", "TROPONINI" in the last 168 hours. BNP (last 3 results) No results for input(s): "PROBNP" in the last 8760 hours. HbA1C: No results for input(s): "HGBA1C" in the last 72 hours. CBG: Recent Labs  Lab  10/16/22 1132 10/16/22 1619 10/16/22 2107 10/17/22 0609 10/17/22 1103  GLUCAP 181* 71 73 73 150*   Lipid Profile: No results for input(s): "CHOL", "HDL", "LDLCALC", "TRIG", "CHOLHDL", "LDLDIRECT" in the last 72 hours. Thyroid Function Tests: No results for input(s): "TSH", "T4TOTAL", "FREET4", "T3FREE", "THYROIDAB" in the last 72 hours. Anemia Panel: No results for input(s): "VITAMINB12", "FOLATE", "FERRITIN", "TIBC", "IRON", "RETICCTPCT" in the last 72 hours. Sepsis Labs: Recent Labs  Lab 10/11/22 0026  PROCALCITON 0.62    No results found for this or any previous visit (from the past 240 hour(s)).   Radiology Studies: No results found.  Scheduled Meds:  amLODipine  10 mg Oral Daily   aspirin EC  81 mg Oral Daily   atorvastatin  80 mg Oral Daily   ezetimibe  10 mg Oral Daily   FLUoxetine  20 mg Oral Daily   insulin aspart  0-9 Units Subcutaneous TID WC   ipratropium-albuterol  3 mL Nebulization BID   mesalamine  1,000 mg Rectal BID   multivitamin with minerals  1 tablet Oral Daily   pantoprazole  40 mg Oral Daily   polyethylene glycol  17 g Oral Daily   tiZANidine  2 mg Oral BID   Continuous Infusions:   LOS: 13 days   Hughie Closs, MD [facility]  10/17/2022, 2:02 PM   *Please note that this is a verbal dictation therefore any spelling or grammatical errors are due to the "[employer] One" system interpretation.  Please page via Amion and do not message via secure chat for urgent patient care matters. Secure chat can be used for non urgent patient care matters.  How to contact the [facility] Attending or Consulting provider 7A - 7P or covering provider during after hours 7P -7A, for this patient?  Check the care team in [facility] and look for a) attending/consulting TR[facility] provider listed and b) the [facility] team listed. Page or secure chat 7A-7P. Log into [email or web address] and use [facility] universal password to access. If you do not have the password, please contact the  hospital operator. Locate the [facility] provider you are looking for under [facility] and page to a number that you can be directly reached. If you still have difficulty reaching the provider, please page the [facility] (Director on Call) for the Hospitalists listed on amion for assistance.

## 2022-10-17 NOTE — Discharge Summary (Signed)
Physician Discharge Summary  [name] GNF:[id number] DOB: 1960-09-16 DOA: 10/04/2022  PCP: [place], [name], MD  Admit date: 10/04/2022 Discharge date: 10/18/2022 30 Day Unplanned Readmission Risk Score    Flowsheet Row ED to Hosp-Admission (Current) from 10/04/2022 in [facility] PROGRESSIVE CARE  30 Day Unplanned Readmission Risk Score (%) 47.13 Filed at 10/18/2022 0801       This score is the patient's risk of an unplanned readmission within 30 days of being discharged (0 -100%). The score is based on dignosis, age, lab data, medications, orders, and past utilization.   Low:  0-14.9   Medium: 15-21.9   High: 22-29.9   Extreme: 30 and above          Admitted From: SNF Disposition: SNF  Recommendations for Outpatient Follow-up:  Follow up with PCP in 1-2 weeks Please obtain BMP/CBC in one week Please follow up with your PCP on the following pending results: Unresulted Labs (From admission, onward)    None         Home Health: None Equipment/Devices: None  Discharge Condition: Stable CODE STATUS: Full code Diet recommendation: Cardiac  Subjective: Seen and examined.  Feels well other than neck pain and left upper extremity pain which is chronic.  She is fully alert and oriented.  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  Patient presented with shortness of breath.  Patient was recently hospitalized for aspiration pneumonia/proctitis, completed a course of Unasyn, and was discharged on mesalamine suppository.  Patient was seen at the gastroenterology office, was noted to be tachycardic.  Patient was therefore sent to the ER.  CTA chest was negative for PE, but did show bilateral multifocal opacity.  CT of the abdomen pelvis was negative.  Patient was admitted with severe sepsis due to recurrent aspiration  pneumonia/proctitis on empiric Rocephin, azithromycin and Flagyl.  Antibiotics were initially transitioned to Rocephin, and then to Augmentin which she completed course on 10/15/2022.  Patient underwent Flex that revealed multiple ulcers involving the rectosigmoid area. Biopsies from the flexible sigmoidoscopy showed moderate to severe nonspecific proctocolitis with erosion, but no chronic inflammatory changes to suggest IBD.  CMV staining negative.  DDx includes infection or medication-induced  Patient is currently on mesalamine suppository.  Patient is comfortable, back at her baseline.  She will be discharged today in stable condition back to her SNF.  Further details below.   Bilateral hilar lymphadenopathy -Suspect from underlying infection. -Seen on the CT scan.   -Needs to follow-up outpatient and have a repeat scan to ensure this is nothing else concerning.   Hypokalemia/hypocalcemia Resolved.   Elevated liver enzymes: Overall stable   Type 2 diabetes: Resume home medications.   Essential hypertension Controlled.  Resume home medications.   Hyperlipidemia: Resumed statin as LFTs improved.   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   CVA with left-sided weakness: Aspirin and Lipitor   Vaginal bleeding: Seen by GYN.  No more bleeding reported.  Hemoglobin is stable.  Discharge plan was discussed with patient and/or family member and they verbalized understanding and agreed with it.  Discharge Diagnoses:  Principal Problem:   CAP (community acquired pneumonia) Active Problems:   Essential hypertension   DM2 (diabetes mellitus, type 2) (HCC)   HLD (hyperlipidemia)   Rectal pain   Severe sepsis (HCC)   Dehydration   Acute prerenal azotemia   Proctitis   Hypercalcemia   Transaminitis  Pyuria   Depression   Protein-calorie malnutrition, severe    Discharge Instructions   Allergies as of 10/18/2022   No Known Allergies      Medication List     STOP taking  these medications    AMBULATORY NON FORMULARY MEDICATION   Biofreeze 4 % Gel Generic drug: Menthol (Topical Analgesic)   hydrocortisone 25 MG suppository Commonly known as: ANUSOL-HC   insulin glargine-yfgn 100 UNIT/ML injection Commonly known as: SEMGLEE   omeprazole 20 MG tablet Commonly known as: PRILOSEC OTC Replaced by: pantoprazole 40 MG tablet   saccharomyces boulardii 250 MG capsule Commonly known as: FLORASTOR   ticagrelor 90 MG Tabs tablet Commonly known as: BRILINTA       TAKE these medications    acetaminophen 325 MG tablet Commonly known as: TYLENOL Take 650 mg by mouth every 4 (four) hours as needed for mild pain. Do not exceed 3000mg  in 24 hours   amLODipine 10 MG tablet Commonly known as: NORVASC Take 1 tablet by mouth once daily   aspirin EC 81 MG tablet Take 1 tablet (81 mg total) by mouth daily. Swallow whole.   atorvastatin 80 MG tablet Commonly known as: Lipitor Take 1 tablet (80 mg total) by mouth daily.   cyanocobalamin 1000 MCG tablet Take 1,000 mcg by mouth daily.   ezetimibe 10 MG tablet Commonly known as: ZETIA Take 1 tablet (10 mg total) by mouth daily.   FLUoxetine 20 MG capsule Commonly known as: PROZAC Take 1 capsule (20 mg total) by mouth daily.   hydrOXYzine 10 MG tablet Commonly known as: ATARAX Take 1 tablet (10 mg total) by mouth 3 (three) times daily as needed for anxiety.   Lantus SoloStar 100 UNIT/ML Solostar Pen Generic drug: insulin glargine Inject 5 Units into the skin daily. What changed:  how much to take when to take this   mesalamine 1000 MG suppository Commonly known as: CANASA Place 1 suppository (1,000 mg total) rectally 2 (two) times daily. What changed: when to take this   methocarbamol 500 MG tablet Commonly known as: ROBAXIN Take 500 mg by mouth 2 (two) times daily.   ONE TOUCH LANCETS Misc USE TO CHECK BLOOD SUGAR TWICE A DAY AND PRN   OXYGEN Inhale 2 L/min into the lungs as needed.    pantoprazole 40 MG tablet Commonly known as: PROTONIX Take 1 tablet (40 mg total) by mouth daily. Replaces: omeprazole 20 MG tablet   polyethylene glycol 17 g packet Commonly known as: MIRALAX / GLYCOLAX Take 17 g by mouth every other day.   senna-docusate 8.6-50 MG tablet Commonly known as: Senokot-S Take 1 tablet by mouth at bedtime as needed for up to 7 days for moderate constipation.   Trulicity 0.75 MG/0.5ML Sopn Generic drug: Dulaglutide Inject 0.75 mg into the skin once a week.   Vitamin B Complex Tabs Take 1 tablet by mouth every morning.        Follow-up Information     [facility] Follow up.   Specialty: Obstetrics and Gynecology Why: As needed Contact information: [address] [place] [address] [phone]        [place], [name], MD Follow up in 1 week(s).   Specialty: Family Medicine Contact information: [address] [place] [address] [phone]                No Known Allergies  Consultations: GI   Procedures/Studies: DG Swallowing Func-Speech Pathology  Result Date: 10/14/2022 Table formatting from the original result was not included. Modified Barium Swallow Study Patient Details Name: [name] MRN: [id number] Date of Birth: 1960-08-04 Today's Date: 10/14/2022 HPI/PMH: HPI: Pt is a 62 y.o. female who was admitted on 6/4 with severe sepsis due to community-acquired pneumonia after presenting with tachycardia which was noted during outpatient GI visit. CT chest 6/4: Bilateral lower lobe ground-glass and patchy lung opacities with bronchial thickening concerning for bilateral lower lobe pneumonia. PMH: type 2 diabetes mellitus, essential hypertension, hyperlipidemia, stroke. MBS 5/26: moderate oral dysphagia due to dentition impacting labial seal and resulting in anterior spillage of liquids. Pharyngeal dysphagia with delayed initaition of swallow  even when a adequate oral bolus hold is achieved. Pt had sensed and silent penetration before the swallow with subsequent aspiration of penetrate when glottic closure is released. A dysphagia 3 diet with nectar thick liquids was recommended at that time. Pt discharged from hospital 5/28 and was on that diet at that time.Repeat MBS for possible upgrade. Clinical Impression: Clinical Impression: Pt demonstrated mild improvements from previous MBS. She has difficulty sealing lips due to projection of anterior dentition resulting in anterior spill with thin liquid. Decreased oral cohesion and transit of boluses with thin. She continues to exhibit decreased timing of laryngeal closure resulting in penetration and aspiration before laryngeal elevation and closure is achieved with thin liquids (tsp, cup). A supraglottic swallow strategy was ineffective and resulted in aspiration however a chin tuck consistently prevented penetration with cup and straw in 90% of opportunities throughout the study. There was no significant pharyngeal residue and esophageal scan was unremarkable. Pt is currently on a puree diet and recommend upgrade diet texture to Dys 2 as she was able to masticate slowly but effectively. Upgrade to thin liquids with a chin tuck with or without straw use, pills whole in  puree and assist with meals to ensure performance of strategies. Factors that may increase risk of adverse event in presence of aspiration [name] & [name] 2021): Factors that may increase risk of adverse event in presence of aspiration [name] & [name] 2021): Inadequate oral hygiene Recommendations/Plan: Swallowing Evaluation Recommendations Swallowing Evaluation Recommendations Recommendations: PO diet PO Diet Recommendation: Dysphagia 2 (Finely chopped); Thin liquids (Level 0) (thin with chin tuck) Liquid Administration via: Straw Medication Administration: Whole meds with puree Supervision: Patient able to self-feed Swallowing  strategies  : Slow rate; Small bites/sips; Chin tuck (with thin) Postural changes: Position pt fully upright for meals Oral care recommendations: Oral care BID (2x/day) Treatment Plan Treatment Plan Treatment recommendations: Therapy as outlined in treatment plan below Follow-up recommendations: Skilled nursing-short term rehab (<3 hours/day) Functional status assessment: Patient has had a recent decline in their functional status and demonstrates the ability to make significant improvements in function in a reasonable and predictable amount of time. Treatment frequency: Min 2x/week Treatment duration: 2 weeks Interventions: Aspiration precaution training; Compensatory techniques; Patient/family education; Trials of upgraded texture/liquids; Diet toleration management by SLP Recommendations Recommendations for follow up therapy are one component of a multi-disciplinary discharge planning process, led by the attending physician.  Recommendations may be updated based on patient status, additional functional criteria and insurance authorization. Assessment: Orofacial Exam: Orofacial Exam Oral Cavity: Oral Hygiene: WFL Oral Cavity - Dentition: Missing dentition Anatomy: Anatomy: WFL Boluses Administered: Boluses Administered Boluses Administered: Thin liquids (Level 0); Mildly thick liquids (Level 2, nectar thick); Puree; Solid  Oral Impairment Domain: Oral Impairment Domain Lip Closure: Escape beyond mid-chin Tongue control during bolus hold: Posterior escape  of less than half of bolus Bolus preparation/mastication: Slow prolonged chewing/mashing with complete recollection Bolus transport/lingual motion: Delayed initiation of tongue motion (oral holding) Oral residue: Trace residue lining oral structures Location of oral residue : Tongue Initiation of pharyngeal swallow : Pyriform sinuses  Pharyngeal Impairment Domain: Pharyngeal Impairment Domain Soft palate elevation: No bolus between soft palate (SP)/pharyngeal wall  (PW) Laryngeal elevation: Partial superior movement of thyroid cartilage/partial approximation of arytenoids to epiglottic petiole Anterior hyoid excursion: Partial anterior movement Epiglottic movement: Complete inversion Laryngeal vestibule closure: Incomplete, narrow column air/contrast in laryngeal vestibule Pharyngeal stripping wave : Present - complete Pharyngeal contraction (A/P view only): N/A Pharyngoesophageal segment opening: Complete distension and complete duration, no obstruction of flow Tongue base retraction: Trace column of contrast or air between tongue base and PPW Pharyngeal residue: Complete pharyngeal clearance  Esophageal Impairment Domain: Esophageal Impairment Domain Esophageal clearance upright position: Complete clearance, esophageal coating Pill: No data recorded Penetration/Aspiration Scale Score: Penetration/Aspiration Scale Score 1.  Material does not enter airway: Solid; Puree 3.  Material enters airway, remains ABOVE vocal cords and not ejected out: Mildly thick liquids (Level 2, nectar thick) 7.  Material enters airway, passes BELOW cords and not ejected out despite cough attempt by patient: Thin liquids (Level 0) Compensatory Strategies: Compensatory Strategies Compensatory strategies: Yes Straw: Effective Effective Straw: Thin liquid (Level 0) (with chin tuck) Chin tuck: Effective Effective Chin Tuck: Thin liquid (Level 0) Supraglottic swallow: Ineffective Ineffective Supraglottic Swallow: Thin liquid (Level 0)   General Information: Caregiver present: No  Diet Prior to this Study: Other (Comment) (full liquids nectar thidck)   Temperature : Normal   Respiratory Status: WFL   Supplemental O2: Nasal cannula   History of Recent Intubation: No  Behavior/Cognition: Alert; Cooperative; Pleasant mood Self-Feeding Abilities: Able to self-feed Baseline vocal quality/speech: Normal Volitional Cough: Able to elicit Volitional Swallow: Able to elicit No data recorded Goal Planning: Prognosis  for improved oropharyngeal function: Good No data recorded No data recorded No data recorded Consulted and agree with results and recommendations: Patient Pain: Pain Assessment Pain Assessment: Faces Faces Pain Scale: 0 End of Session: Start Time:SLP Start Time (ACUTE ONLY): 1213 Stop Time: SLP Stop Time (ACUTE ONLY): 1234 Time Calculation:SLP Time Calculation (min) (ACUTE ONLY): 21 min Charges: SLP Evaluations $ SLP Speech Visit: 1 Visit SLP Evaluations $MBS Swallow: 1 Procedure $Swallowing Treatment: 1 Procedure SLP visit diagnosis: SLP Visit Diagnosis: Dysphagia, oropharyngeal phase (R13.12) Past Medical History: Past Medical History: Diagnosis Date  Abnormality of gait 05/10/2010  BACK PAIN 11/14/2008  Class 1 obesity due to excess calories with body mass index (BMI) of 31.0 to 31.9 in adult 02/07/2021  DIABETES MELLITUS, TYPE II 07/15/2008  Diplopia 07/15/2008  ECZEMA, ATOPIC 04/03/2009  GERD (gastroesophageal reflux disease)   Guillain-Barre (HCC) 1988  HYPERLIPIDEMIA 03/06/2009  HYPERTENSION 07/15/2008  Stroke (HCC) 2010, 2011  x2   Vertebral artery stenosis  Past Surgical History: Past Surgical History: Procedure Laterality Date  ABDOMINAL HYSTERECTOMY    BIOPSY  07/08/2022  Procedure: BIOPSY;  Surgeon: Benancio Deeds, MD;  Location: [facility];  Service: Gastroenterology;;  COLONOSCOPY WITH PROPOFOL N/A 07/08/2022  Procedure: COLONOSCOPY WITH PROPOFOL;  Surgeon: Benancio Deeds, MD;  Location: [facility];  Service: Gastroenterology;  Laterality: N/A;  DILATION AND CURETTAGE OF UTERUS    FOOT SURGERY    IR ANGIO INTRA EXTRACRAN SEL COM CAROTID INNOMINATE UNI L MOD SED  09/17/2021  IR ANGIO INTRA EXTRACRAN SEL INTERNAL CAROTID UNI R MOD SED  09/17/2021  IR  ANGIO VERTEBRAL SEL VERTEBRAL UNI R MOD SED  09/17/2021  IR US GUIDE VASC ACCESS RIGHT  09/17/2021 Royce Macadamia 10/14/2022, 1:48 PM  DG Chest Port 1 View  Result Date: 10/11/2022 CLINICAL DATA:  Shortness of breath EXAM: PORTABLE CHEST 1  VIEW COMPARISON:  10/04/2022 FINDINGS: Heart size upper limits of normal. Aortic atherosclerotic calcification again seen. The pulmonary vascularity is normal. Mild patchy density in the perihilar regions and lower lobes persists consistent with mild bronchopneumonia. No worsening or progressive finding. No visible effusion. IMPRESSION: Persistent mild patchy density in the perihilar regions and lower lobes consistent with mild bronchopneumonia. Electronically Signed   By: Paulina Fusi M.D.   On: 10/11/2022 11:02   CT Angio Chest PE W and/or Wo Contrast  Result Date: 10/04/2022 CLINICAL DATA:  Pulmonary embolism suspected. EXAM: CT ANGIOGRAPHY CHEST WITH CONTRAST TECHNIQUE: Multidetector CT imaging of the chest was performed using the standard protocol during bolus administration of intravenous contrast. Multiplanar CT image reconstructions and MIPs were obtained to evaluate the vascular anatomy. RADIATION DOSE REDUCTION: This exam was performed according to the departmental dose-optimization program which includes automated exposure control, adjustment of the mA and/or kV according to patient size and/or use of iterative reconstruction technique. CONTRAST:  75mL OMNIPAQUE IOHEXOL 350 MG/ML SOLN COMPARISON:  None Available. FINDINGS: Cardiovascular: Satisfactory opacification of the pulmonary arteries to the segmental level. No evidence of pulmonary embolism. Normal heart size. No pericardial effusion. Main pulmonary trunk is dilated measuring up to 3.4 cm concerning for pulmonary arterial hypertension. Aberrant origin of the right subclavian artery. Mediastinum/Nodes: Bulky bilateral axillary, prevascular, right hilar, subcarinal lymph nodes, not significantly changed since prior examination, concerning for lymphoma. Lungs/Pleura: Bilateral lower lobe ground-glass and patchy lung opacities with bronchial thickening concerning for bilateral lower lobe pneumonia. No pleural effusion or pneumothorax. Upper Abdomen:  No acute abnormality. Musculoskeletal: No chest wall abnormality. No acute or significant osseous findings. Review of the MIP images confirms the above findings. IMPRESSION: 1. No CT evidence of pulmonary embolism. 2. Bilateral lower lobe ground-glass and patchy lung opacities with bronchial thickening concerning for bilateral lower lobe pneumonia. 3. Bulky bilateral axillary, prevascular, right hilar, subcarinal lymph nodes, not significantly changed since prior examination, concerning for lymphoma. 4. Dilated main pulmonary trunk measuring up to 3.4 cm concerning for pulmonary arterial hypertension. 5. Aberrant origin of the right subclavian artery. Electronically Signed   By: Larose Hires D.O.   On: 10/04/2022 19:25   CT ABDOMEN PELVIS W CONTRAST  Result Date: 10/04/2022 CLINICAL DATA:  Bowel obstruction suspected. Emesis. History of CLL. EXAM: CT ABDOMEN AND PELVIS WITH CONTRAST TECHNIQUE: Multidetector CT imaging of the abdomen and pelvis was performed using the standard protocol following bolus administration of intravenous contrast. RADIATION DOSE REDUCTION: This exam was performed according to the departmental dose-optimization program which includes automated exposure control, adjustment of the mA and/or kV according to patient size and/or use of iterative reconstruction technique. CONTRAST:  75mL OMNIPAQUE IOHEXOL 350 MG/ML SOLN COMPARISON:  Abdominal ultrasound 09/24/2022. CT angiogram chest abdomen and pelvis 05/29/2022. FINDINGS: Lower chest: There are minimal patchy airspace and ground-glass opacities in both lower lobes, likely infectious/inflammatory. Hepatobiliary: No focal liver abnormality is seen. No gallstones, gallbladder wall thickening, or biliary dilatation. Pancreas: Unremarkable. No pancreatic ductal dilatation or surrounding inflammatory changes. Spleen: Normal in size without focal abnormality. Adrenals/Urinary Tract: There is indistinctness of the corticomedullary enhancement pattern  both kidneys with mild bilateral perinephric fat stranding, right greater than left. There is no hydronephrosis or perinephric  fluid collection. Bilateral renal cysts are present measuring up 2 4.3 cm in the inferior pole the left kidney. Appearance is similar to the prior study. The adrenal glands and bladder are within normal limits. Stomach/Bowel: There is inferior rectal wall thickening inflammation, new from prior. There are new small perirectal lymph nodes. There is no bowel obstruction, pneumatosis or free air. Appendix, stomach and small bowel are within normal limits. Vascular/Lymphatic: Aorta and IVC are normal in size. There are atherosclerotic calcifications of the aorta. There is diffuse retroperitoneal lymphadenopathy with the largest lymph node in the aortocaval region measuring 2.7 x 1.8 cm, similar to the prior study. Retrocaval lymphadenopathy has slightly increased for example image 3/40 measuring 16 mm short axis (previously 13). Right iliac chain lymphadenopathy has increased measuring 3.5 by 4.7 cm (previously 2.9 by 3.5 cm). Left iliac chain lymphadenopathy measures up to 2.1 cm (previously 18 mm). Bilateral pelvic sidewall lymphadenopathy has increased, right greater than left. The largest lymph node is along the right pelvic sidewall measuring 3.0 x 4.7 cm (previously 1.1 x 3.6 cm). There is new bilateral inguinal lymphadenopathy, right greater than left, measuring up to 16 mm short axis. Additionally, there is increasing periportal lymphadenopathy with largest lymph node measuring up to 2 cm (previously 17 mm). Bulky central mesenteric lymphadenopathy is mildly diffusely increased. Right lower quadrant mesenteric lymphadenopathy has also mildly diffusely increased. Reproductive: Status post hysterectomy. No adnexal masses. Other: There is a small fat containing umbilical hernia. There is no ascites. Musculoskeletal: Bones are diffusely heterogeneous, unchanged. No acute fractures are seen.  IMPRESSION: 1. There is new inferior rectal wall thickening and inflammation with new perirectal lymph nodes. Findings are worrisome for proctitis. Neoplasm not excluded. 2. Increased diffuse retroperitoneal, mesenteric, pelvic sidewall, inguinal, and periportal lymphadenopathy. 3. Bilateral perinephric fat stranding with indistinctness of the corticomedullary enhancement pattern of both kidneys worrisome for pyelonephritis. No hydronephrosis or perinephric fluid collection. 4. Minimal patchy airspace and ground-glass opacities in both lower lobes, likely infectious/inflammatory. 5. Stable heterogeneous appearance of the bones, likely related to patient's history of CLL. Aortic Atherosclerosis (ICD10-I70.0). Electronically Signed   By: Darliss Cheney M.D.   On: 10/04/2022 19:21   DG Chest Port 1 View  Result Date: 10/04/2022 CLINICAL DATA:  Shortness of breath EXAM: PORTABLE CHEST 1 VIEW COMPARISON:  Radiograph and CT 09/23/2022 FINDINGS: Stable cardiomediastinal silhouette. Aortic atherosclerotic calcification. Scarring left mid lung. No focal consolidation, pleural effusion, or pneumothorax. Prominent right hilum similar to prior radiograph and likely related to adenopathy as seen on CT 09/24/2022. No displaced rib fractures. IMPRESSION: 1. No active disease. 2. Prominent right hilum likely related to adenopathy as seen on CT 09/24/2022. Electronically Signed   By: Minerva Fester M.D.   On: 10/04/2022 17:31   DG Swallowing Func-Speech Pathology  Result Date: 09/25/2022 Table formatting from the original result was not included. Modified Barium Swallow Study Patient Details Name: [name] MRN: [id number] Date of Birth: 02/16/1961 Today's Date: 09/25/2022 HPI/PMH: HPI: Chanta Scalera is an 62 y.o. female   Admission for 62 y/o x med problems.   H/O NHL in remission.  Nausea / vomiting and dehydration   Generalized weakness.   Last night she developed cough and SOB.   90% o2 at RA.  CT chest revealed   Scattered ground-glass predominately  perihilar airspace opacities, left greater than right could reflect  early asymmetric edema or infection.  ST Clinical Impression: Pt demonstrates a moderate oral dysphagia due to dentition impacting labial seal. Pt has  anterior spillage of liquids and decreased bolus cohesion of thins with oral residue around anterior and lateral sulci. However, when given solids pt is able to masticate and form bolus. Cleared solid from mouth. Pt hates puree and would like to avoid this texture if possible. Risk of aspiration is not from solids, but from thin liquids. There is an additional pharyngeal dysphagia with delayed initaition of swallow even when a adequate oral bolus hold is achieved. Pt has sensed and silent penetration before the swallow with subsequent aspiration of penetrate when glottic closure is released. A chin tuck was trialed which was effective with a single sip, but not with consecutive sips. A supersupraglottic swallow has potential but was not achieved in this study. Pt was able to consume nectar thick liquids  with a straw with only trace penetration. Recommend dys 3 diet with nectar at this time. Factors that may increase risk of adverse event in presence of aspiration [name] & [name] 2021): Factors that may increase risk of adverse event in presence of aspiration [name] & [name] 2021): Inadequate oral hygiene Recommendations/Plan: Swallowing Evaluation Recommendations Swallowing Evaluation Recommendations Recommendations: PO diet PO Diet Recommendation: Dysphagia 3 (Mechanical soft); Mildly thick liquids (Level 2, nectar thick) Liquid Administration via: Straw Medication Administration: Whole meds with puree Supervision: Staff to assist with self-feeding Swallowing strategies  : Slow rate; Small bites/sips; Check for anterior loss Postural changes: Position pt fully upright for meals; Stay upright 30-60 min after meals Oral care recommendations: Oral care BID  (2x/day) Caregiver Recommendations: Have oral suction available Treatment Plan Treatment Plan Treatment recommendations: Therapy as outlined in treatment plan below Follow-up recommendations: Skilled nursing-short term rehab (<3 hours/day) Functional status assessment: Patient has had a recent decline in their functional status and demonstrates the ability to make significant improvements in function in a reasonable and predictable amount of time. Treatment frequency: Min 2x/week Treatment duration: 2 weeks Interventions: Aspiration precaution training Recommendations Recommendations for follow up therapy are one component of a multi-disciplinary discharge planning process, led by the attending physician.  Recommendations may be updated based on patient status, additional functional criteria and insurance authorization. Assessment: Orofacial Exam: Orofacial Exam Oral Cavity - Dentition: Missing dentition Anatomy: Anatomy: WFL Boluses Administered: Boluses Administered Boluses Administered: Thin liquids (Level 0); Mildly thick liquids (Level 2, nectar thick); Moderately thick liquids (Level 3, honey thick); Puree; Solid  Oral Impairment Domain: Oral Impairment Domain Lip Closure: Escape beyond mid-chin Tongue control during bolus hold: Posterior escape of greater than half of bolus Bolus preparation/mastication: Slow prolonged chewing/mashing with complete recollection Bolus transport/lingual motion: Slow tongue motion Oral residue: Residue collection on oral structures Location of oral residue : Floor of mouth; Lateral sulci Initiation of pharyngeal swallow : Pyriform sinuses  Pharyngeal Impairment Domain: Pharyngeal Impairment Domain Soft palate elevation: No bolus between soft palate (SP)/pharyngeal wall (PW) Laryngeal elevation: Complete superior movement of thyroid cartilage with complete approximation of arytenoids to epiglottic petiole Anterior hyoid excursion: Complete anterior movement Epiglottic movement:  Complete inversion Laryngeal vestibule closure: Complete, no air/contrast in laryngeal vestibule Pharyngeal stripping wave : Present - complete Pharyngeal contraction (A/P view only): N/A Pharyngoesophageal segment opening: Complete distension and complete duration, no obstruction of flow Tongue base retraction: No contrast between tongue base and posterior pharyngeal wall (PPW) Pharyngeal residue: Complete pharyngeal clearance  Esophageal Impairment Domain: Esophageal Impairment Domain Esophageal clearance upright position: Esophageal retention Pill: Esophageal Impairment Domain Esophageal clearance upright position: Esophageal retention Penetration/Aspiration Scale Score: Penetration/Aspiration Scale Score 1.  Material does not enter airway:  Thin liquids (Level 0); Mildly thick liquids (Level 2, nectar thick); Moderately thick liquids (Level 3, honey thick); Puree; Solid; Pill 2.  Material enters airway, remains ABOVE vocal cords then ejected out: Mildly thick liquids (Level 2, nectar thick) 7.  Material enters airway, passes BELOW cords and not ejected out despite cough attempt by patient: Thin liquids (Level 0) 8.  Material enters airway, passes BELOW cords without attempt by patient to eject out (silent aspiration) : Thin liquids (Level 0) Compensatory Strategies: Compensatory Strategies Compensatory strategies: Yes Straw: Effective Chin tuck: Effective; Ineffective Effective Chin Tuck: Thin liquid (Level 0) (via single sip) Ineffective Chin Tuck: Thin liquid (Level 0) (consecutive sip)   General Information: Caregiver present: No  Diet Prior to this Study: Thin liquids (Level 0); Dysphagia 1 (pureed)   Temperature : Normal   Respiratory Status: Increased WOB   Supplemental O2: Nasal cannula   History of Recent Intubation: No  Behavior/Cognition: Alert; Cooperative; Other (Comment) Self-Feeding Abilities: Needs assist with self-feeding Baseline vocal quality/speech: Hypophonia/low volume Volitional Cough: Able  to elicit Volitional Swallow: Able to elicit No data recorded Goal Planning: Prognosis for improved oropharyngeal function: Good No data recorded No data recorded Patient/Family Stated Goal: Feel better No data recorded Pain: Pain Assessment Pain Assessment: No/denies pain Faces Pain Scale: 4 Pain Location: buttock Pain Descriptors / Indicators: Discomfort Pain Intervention(s): Monitored during session End of Session: Start Time:SLP Start Time (ACUTE ONLY): 1110 Stop Time: SLP Stop Time (ACUTE ONLY): 1130 Time Calculation:SLP Time Calculation (min) (ACUTE ONLY): 20 min Charges: SLP Evaluations $ SLP Speech Visit: 1 Visit SLP Evaluations $BSS Swallow: 1 Procedure $MBS Swallow: 1 Procedure $Swallowing Treatment: 1 Procedure SLP visit diagnosis: SLP Visit Diagnosis: Dysphagia, oropharyngeal phase (R13.12) Past Medical History: Past Medical History: Diagnosis Date  Abnormality of gait 05/10/2010  BACK PAIN 11/14/2008  Class 1 obesity due to excess calories with body mass index (BMI) of 31.0 to 31.9 in adult 02/07/2021  DIABETES MELLITUS, TYPE II 07/15/2008  Diplopia 07/15/2008  ECZEMA, ATOPIC 04/03/2009  GERD (gastroesophageal reflux disease)   Guillain-Barre (HCC) 1988  HYPERLIPIDEMIA 03/06/2009  HYPERTENSION 07/15/2008  Stroke (HCC) 2010, 2011  x2   Vertebral artery stenosis  Past Surgical History: Past Surgical History: Procedure Laterality Date  ABDOMINAL HYSTERECTOMY    BIOPSY  07/08/2022  Procedure: BIOPSY;  Surgeon: Benancio Deeds, MD;  Location: [facility];  Service: Gastroenterology;;  COLONOSCOPY WITH PROPOFOL N/A 07/08/2022  Procedure: COLONOSCOPY WITH PROPOFOL;  Surgeon: Benancio Deeds, MD;  Location: [facility];  Service: Gastroenterology;  Laterality: N/A;  DILATION AND CURETTAGE OF UTERUS    FOOT SURGERY    IR ANGIO INTRA EXTRACRAN SEL COM CAROTID INNOMINATE UNI L MOD SED  09/17/2021  IR ANGIO INTRA EXTRACRAN SEL INTERNAL CAROTID UNI R MOD SED  09/17/2021  IR ANGIO VERTEBRAL SEL VERTEBRAL UNI R  MOD SED  09/17/2021  IR US GUIDE VASC ACCESS RIGHT  09/17/2021 DeBlois, Riley Nearing 09/25/2022, 11:51 AM  US Abdomen Limited RUQ (LIVER/GB)  Result Date: 09/24/2022 CLINICAL DATA:  Gallbladder wall thickening. EXAM: ULTRASOUND ABDOMEN LIMITED RIGHT UPPER QUADRANT COMPARISON:  08/26/2022. FINDINGS: Gallbladder: The gallbladder is distended with sludge. No gallbladder wall thickening. No sonographic Murphy sign noted by sonographer. Common bile duct: Diameter: 5.3 mm Liver: No focal lesion identified. Within normal limits in parenchymal echogenicity. Portal vein is patent on color Doppler imaging with normal direction of blood flow towards the liver. Other: No free fluid. IMPRESSION: Distended gallbladder with sludge. No evidence of acute cholecystitis. Electronically  Signed   By: Thornell Sartorius M.D.   On: 09/24/2022 04:46   CT Angio Chest PE W/Cm &/Or Wo Cm  Result Date: 09/24/2022 CLINICAL DATA:  Pulmonary embolism (PE) suspected, high prob hypoxic, tachypneic pt -- some rhonchi but concern for PE given her sx and clear CXR EXAM: CT ANGIOGRAPHY CHEST WITH CONTRAST TECHNIQUE: Multidetector CT imaging of the chest was performed using the standard protocol during bolus administration of intravenous contrast. Multiplanar CT image reconstructions and MIPs were obtained to evaluate the vascular anatomy. RADIATION DOSE REDUCTION: This exam was performed according to the departmental dose-optimization program which includes automated exposure control, adjustment of the mA and/or kV according to patient size and/or use of iterative reconstruction technique. CONTRAST:  75mL OMNIPAQUE IOHEXOL 350 MG/ML SOLN COMPARISON:  05/29/2022. FINDINGS: Cardiovascular: No filling defects in the pulmonary arteries to suggest pulmonary emboli. Heart borderline in size. Aorta normal caliber. Scattered aortic calcifications. Retroesophageal right subclavian artery. Mediastinum/Nodes: Fall bulky mediastinal adenopathy again noted,  worsening since prior study. Index left axillary lymph node has a short axis diameter of 2.9 cm compared to 2.5 cm previously. Index right hilar lymph node has a short axis diameter of 2.8 cm compared with 1.6 cm previously. Increasing mediastinal lymph node size. Subcarinal lymph node has a short axis diameter of 15 mm compared to 10 mm previously. Prevascular lymph node has a short axis diameter of 8 mm compared to 5 mm previously. Trachea and esophagus are unremarkable. Thyroid unremarkable. Lungs/Pleura: Ground-glass airspace opacities in the perihilar regions, left slightly greater than right. Favor asymmetric edema although infection cannot be excluded. No effusions. Upper Abdomen: Gallbladder appears distended with possible wall thickening and stones within the gallbladder. Low-density throughout the liver suggesting fatty infiltration. Musculoskeletal: Chest wall soft tissues are unremarkable. No acute bony abnormality. Review of the MIP images confirms the above findings. IMPRESSION: Worsening bulky bilateral axillary adenopathy concerning for lymphoma. Borderline sized mediastinal lymph nodes. Borderline heart size. Scattered ground-glass predominately perihilar airspace opacities, left greater than right could reflect early asymmetric edema or infection. Gallbladder appears distended with possible wall thickening and internal stones. Consider further evaluation with right upper quadrant ultrasound. Aortic atherosclerosis. Electronically Signed   By: Charlett Nose M.D.   On: 09/24/2022 01:19   DG Chest 2 View  Result Date: 09/23/2022 CLINICAL DATA:  Shortness of breath. Left greater than right rhonchi. Hypoxia. EXAM: CHEST - 2 VIEW COMPARISON:  Radiograph 08/26/2022. CT 05/29/2022 FINDINGS: Lung volumes are low. Heart size upper normal. Stable mediastinal contours. Atelectasis/scarring in the left mid lung. No acute airspace disease. Normal pulmonary vasculature. No pleural fluid or pneumothorax no  acute osseous abnormalities are seen. IMPRESSION: Low lung volumes with atelectasis/scarring in the left mid lung. Electronically Signed   By: Narda Rutherford M.D.   On: 09/23/2022 23:05     Discharge Exam: Vitals:   10/18/22 0906 10/18/22 0952  BP:  (!) 143/85  Pulse:    Resp:    Temp:    SpO2: 94%    Vitals:   10/18/22 0410 10/18/22 0700 10/18/22 0906 10/18/22 0952  BP: (!) 144/87 128/81  (!) 143/85  Pulse: (!) 102 100    Resp: (!) 22 (!) 23    Temp: 98.1 F (36.7 C) 98.2 F (36.8 C)    TempSrc: Oral Oral    SpO2: 97% 98% 94%   Weight:      Height:        General: Pt is alert, awake, not in acute distress Cardiovascular:  RRR, S1/S2 +, no rubs, no gallops Respiratory: CTA bilaterally, no wheezing, no rhonchi Abdominal: Soft, NT, ND, bowel sounds + Extremities: no edema, no cyanosis Neuro: Dysarthria with left hemiplegia, 4/5 power in left upper extremity and 1/5 power in left lower extremity, at her baseline from previous stroke.   The results of significant diagnostics from this hospitalization (including imaging, microbiology, ancillary and laboratory) are listed below for reference.     Microbiology: No results found for this or any previous visit (from the past 240 hour(s)).   Labs: BNP (last 3 results) Recent Labs    09/23/22 2237 10/04/22 1621 10/11/22 0026  BNP 94.2 45.3 49.0   Basic Metabolic Panel: Recent Labs  Lab 10/11/22 2355 10/12/22 2343 10/14/22 0023 10/15/22 0013 10/16/22 0018  NA 141 142 142 141 137  K 3.4* 3.6 3.5 4.8 3.7  CL 112* 112* 111 110 108  CO2 21* 22 22 22 22   GLUCOSE 146* 128* 175* 129* 101*  BUN 12 11 7* 6* 7*  CREATININE 0.82 0.67 0.79 0.72 0.77  CALCIUM 9.4 9.4 9.6 9.4 9.2  MG 2.0 1.9 1.9 2.0 2.0  PHOS  --   --   --  2.7  --    Liver Function Tests: Recent Labs  Lab 10/15/22 0013  ALBUMIN 2.5*   No results for input(s): "LIPASE", "AMYLASE" in the last 168 hours. No results for input(s): "AMMONIA" in the last  168 hours. CBC: Recent Labs  Lab 10/11/22 2355 10/12/22 2343 10/14/22 0023 10/15/22 0012 10/16/22 0018  WBC 7.2 7.1 9.4 8.2 6.0  HGB 9.8* 9.7* 10.4* 10.5* 9.9*  HCT 30.9* 29.9* 32.4* 32.2* 30.8*  MCV 78.8* 79.3* 79.8* 79.1* 78.4*  PLT 290 293 304 293 291   Cardiac Enzymes: No results for input(s): "CKTOTAL", "CKMB", "CKMBINDEX", "TROPONINI" in the last 168 hours. BNP: Invalid input(s): "POCBNP" CBG: Recent Labs  Lab 10/17/22 0609 10/17/22 1103 10/17/22 1622 10/17/22 2148 10/18/22 0626  GLUCAP 73 150* 74 116* 98   D-Dimer No results for input(s): "DDIMER" in the last 72 hours. Hgb A1c No results for input(s): "HGBA1C" in the last 72 hours. Lipid Profile No results for input(s): "CHOL", "HDL", "LDLCALC", "TRIG", "CHOLHDL", "LDLDIRECT" in the last 72 hours. Thyroid function studies No results for input(s): "TSH", "T4TOTAL", "T3FREE", "THYROIDAB" in the last 72 hours.  Invalid input(s): "FREET3" Anemia work up No results for input(s): "VITAMINB12", "FOLATE", "FERRITIN", "TIBC", "IRON", "RETICCTPCT" in the last 72 hours. Urinalysis    Component Value Date/Time   COLORURINE YELLOW 10/04/2022 2224   APPEARANCEUR CLOUDY (A) 10/04/2022 2224   LABSPEC 1.023 10/04/2022 2224   PHURINE 7.0 10/04/2022 2224   GLUCOSEU 50 (A) 10/04/2022 2224   HGBUR MODERATE (A) 10/04/2022 2224   HGBUR negative 03/12/2010 0847   BILIRUBINUR NEGATIVE 10/04/2022 2224   BILIRUBINUR n 09/11/2015 1039   KETONESUR NEGATIVE 10/04/2022 2224   PROTEINUR 100 (A) 10/04/2022 2224   UROBILINOGEN 1.0 09/11/2015 1039   UROBILINOGEN 1.0 05/19/2014 1858   NITRITE NEGATIVE 10/04/2022 2224   LEUKOCYTESUR LARGE (A) 10/04/2022 2224   Sepsis Labs Recent Labs  Lab 10/12/22 2343 10/14/22 0023 10/15/22 0012 10/16/22 0018  WBC 7.1 9.4 8.2 6.0   Microbiology No results found for this or any previous visit (from the past 240 hour(s)).  FURTHER DISCHARGE INSTRUCTIONS:   Get Medicines reviewed and  adjusted: Please take all your medications with you for your next visit with your Primary MD   Laboratory/radiological data: Please request your Primary MD to go  over all hospital tests and procedure/radiological results at the follow up, please ask your Primary MD to get all Hospital records sent to his/her office.   In some cases, they will be blood work, cultures and biopsy results pending at the time of your discharge. Please request that your primary care M.D. goes through all the records of your hospital data and follows up on these results.   Also Note the following: If you experience worsening of your admission symptoms, develop shortness of breath, life threatening emergency, suicidal or homicidal thoughts you must seek medical attention immediately by calling 911 or calling your MD immediately  if symptoms less severe.   You must read complete instructions/literature along with all the possible adverse reactions/side effects for all the Medicines you take and that have been prescribed to you. Take any new Medicines after you have completely understood and accpet all the possible adverse reactions/side effects.    Do not drive when taking Pain medications or sleeping medications (Benzodaizepines)   Do not take more than prescribed Pain, Sleep and Anxiety Medications. It is not advisable to combine anxiety,sleep and pain medications without talking with your primary care practitioner   Special Instructions: If you have smoked or chewed Tobacco  in the last 2 yrs please stop smoking, stop any regular Alcohol  and or any Recreational drug use.   Wear Seat belts while driving.   Please note: You were cared for by a hospitalist during your hospital stay. Once you are discharged, your primary care physician will handle any further medical issues. Please note that NO REFILLS for any discharge medications will be authorized once you are discharged, as it is imperative that you return to your  primary care physician (or establish a relationship with a primary care physician if you do not have one) for your post hospital discharge needs so that they can reassess your need for medications and monitor your lab values  Time coordinating discharge: Over 30 minutes  SIGNED:   Hughie Closs, MD  [facility] 10/18/2022, 10:00 AM *Please note that this is a verbal dictation therefore any spelling or grammatical errors are due to the "Dragon [employer] One" system interpretation. If 7PM-7AM, please contact night-coverage [email or web address]

## 2022-10-18 DIAGNOSIS — J189 Pneumonia, unspecified organism: Secondary | ICD-10-CM | POA: Diagnosis not present

## 2022-10-18 LAB — GLUCOSE, CAPILLARY
Glucose-Capillary: 98 mg/dL (ref 70–99)
Glucose-Capillary: 143 mg/dL — ABNORMAL HIGH (ref 70–99)
Glucose-Capillary: 77 mg/dL (ref 70–99)

## 2022-10-18 NOTE — TOC Progression Note (Signed)
Transition of Care [facility]) - Progression Note    Patient Details  Name: [name] MRN: [id number] Date of Birth: [birth year]  [facility]) CM/SW Contact  Leander Rams, LCSW Phone Number: 10/18/2022, 11:31 AM  Clinical Narrative:    CSW spoke with Darel Hong from [facility] regarding pt return. Darel Hong states that pt is LTC and will be coming back under [facility] for STR rehab benefits. CSW explained that [employer] Auerbach is the insurance that is visible, not [employer]. [facility] states that they have this pt under [facility]. They are ready for pt to return. CSW will set up transport.    Expected Discharge Plan: Skilled Nursing Facility Barriers to Discharge: Continued Medical Work up  Expected Discharge Plan and Services         Expected Discharge Date: 10/18/22                                     Social Determinants of Health (SDOH) Interventions SDOH Screenings   Food Insecurity: No Food Insecurity (09/24/2022)  Housing: Low Risk  (09/24/2022)  Transportation Needs: No Transportation Needs (09/24/2022)  Utilities: Not At Risk (09/24/2022)  Depression (PHQ2-9): Low Risk  (03/16/2022)  Tobacco Use: Low Risk  (10/16/2022)    Readmission Risk Interventions     No data to display         Oletta Lamas, MSW, LCSWA, LCASA Transitions of Care  Clinical Social Worker I

## 2022-10-18 NOTE — TOC Transition Note (Signed)
Transition of Care [facility]) - CM/SW Discharge Note   Patient Details  Name: [name] MRN: [id number] Date of Birth: 1960/07/05  Transition of Care [facility]) CM/SW Contact:  Leander Rams, LCSW Phone Number: 10/18/2022, 2:53 PM   Clinical Narrative:    Patient will DC to: [facility] Anticipated DC date: 10/18/2022 Family notified: Myra Transport by: Sharin Mons   Per MD patient ready for DC to [facility]. RN, patient, patient's family, and facility notified of DC. Discharge Summary and FL2 sent to facility. RN to call report prior to discharge [phone]. DC packet on chart. Ambulance transport requested for patient.   CSW will sign off for now as social work intervention is no longer needed. Please consult [place] again if new needs arise.    Final next level of care: Skilled Nursing Facility Barriers to Discharge: No Barriers Identified   Patient Goals and CMS Choice      Discharge Placement                Patient chooses bed at: [facility] Patient to be transferred to facility by: [facility] Name of family member notified: Myra Patient and family notified of of transfer: 10/18/22  Discharge Plan and Services Additional resources added to the After Visit Summary for                                       Social Determinants of Health (SDOH) Interventions SDOH Screenings   Food Insecurity: No Food Insecurity (09/24/2022)  Housing: Low Risk  (09/24/2022)  Transportation Needs: No Transportation Needs (09/24/2022)  Utilities: Not At Risk (09/24/2022)  Depression (PHQ2-9): Low Risk  (03/16/2022)  Tobacco Use: Low Risk  (10/16/2022)     Readmission Risk Interventions     No data to display          Oletta Lamas, MSW, LCSWA, LCASA Transitions of Care  Clinical Social Worker I

## 2022-10-19 ENCOUNTER — Telehealth: Payer: Self-pay

## 2022-10-19 NOTE — Transitions of Care (Post Inpatient/ED Visit) (Signed)
   10/19/2022  Name: [name] MRN: [id number] DOB: 1960-05-20  Today's TOC FU Call Status: Today's TOC FU Call Status:: Unsuccessul Call (1st Attempt) Unsuccessful Call (1st Attempt) Date: 10/19/22  Attempted to reach the patient regarding the most recent Inpatient/ED visit.  Follow Up Plan: Additional outreach attempts will be made to reach the patient to complete the Transitions of Care (Post Inpatient/ED visit) call.   Signature TB,CMA

## 2022-10-20 ENCOUNTER — Telehealth: Payer: Self-pay

## 2022-10-20 NOTE — Transitions of Care (Post Inpatient/ED Visit) (Signed)
   10/20/2022  Name: [name] MRN: [id number] DOB: 11-14-1960  Today's TOC FU Call Status: Today's TOC FU Call Status:: Unsuccessful Call (2nd Attempt) Unsuccessful Call (2nd Attempt) Date: 10/20/22  Attempted to reach the patient regarding the most recent Inpatient/ED visit.  Follow Up Plan: Additional outreach attempts will be made to reach the patient to complete the Transitions of Care (Post Inpatient/ED visit) call.   Signature  TB,CMA

## 2022-10-24 ENCOUNTER — Ambulatory Visit: Payer: Commercial Managed Care - HMO | Admitting: Neurology

## 2022-10-24 NOTE — Progress Notes (Deleted)
HPI:  [name] is a 62 y.o. female, who is here today to follow on recent hospital visit.  Review of Systems See other pertinent positives and negatives in HPI.  Current Outpatient Medications on File Prior to Visit  Medication Sig Dispense Refill   acetaminophen (TYLENOL) 325 MG tablet Take 650 mg by mouth every 4 (four) hours as needed for mild pain. Do not exceed 3000mg  in 24 hours     amLODipine (NORVASC) 10 MG tablet Take 1 tablet by mouth once daily 90 tablet 1   aspirin EC 81 MG tablet Take 1 tablet (81 mg total) by mouth daily. Swallow whole. 30 tablet 12   atorvastatin (LIPITOR) 80 MG tablet Take 1 tablet (80 mg total) by mouth daily. 30 tablet 3   B Complex Vitamins (VITAMIN B COMPLEX) TABS Take 1 tablet by mouth every morning.     cyanocobalamin 1000 MCG tablet Take 1,000 mcg by mouth daily.     Dulaglutide (TRULICITY) 0.75 MG/0.5ML SOPN Inject 0.75 mg into the skin once a week. 6 mL 3   ezetimibe (ZETIA) 10 MG tablet Take 1 tablet (10 mg total) by mouth daily. 30 tablet 3   FLUoxetine (PROZAC) 20 MG capsule Take 1 capsule (20 mg total) by mouth daily. 30 capsule 0   hydrOXYzine (ATARAX) 10 MG tablet Take 1 tablet (10 mg total) by mouth 3 (three) times daily as needed for anxiety. 30 tablet 0   LANTUS SOLOSTAR 100 UNIT/ML Solostar Pen Inject 5 Units into the skin daily. 15 mL 11   mesalamine (CANASA) 1000 MG suppository Place 1 suppository (1,000 mg total) rectally 2 (two) times daily. 30 suppository 12   methocarbamol (ROBAXIN) 500 MG tablet Take 500 mg by mouth 2 (two) times daily.     ONE TOUCH LANCETS MISC USE TO CHECK BLOOD SUGAR TWICE A DAY AND PRN 100 each 6   OXYGEN Inhale 2 L/min into the lungs as needed.     pantoprazole (PROTONIX) 40 MG tablet Take 1 tablet (40 mg total) by mouth daily. 30 tablet 0   polyethylene glycol (MIRALAX / GLYCOLAX) 17 g packet Take 17 g by mouth every other day. 14 each 0   senna-docusate (SENOKOT-S) 8.6-50 MG tablet Take 1  tablet by mouth at bedtime as needed for up to 7 days for moderate constipation. 7 tablet 0   No current facility-administered medications on file prior to visit.    Past Medical History:  Diagnosis Date   Abnormality of gait 05/10/2010   BACK PAIN 11/14/2008   Class 1 obesity due to excess calories with body mass index (BMI) of 31.0 to 31.9 in adult 02/07/2021   DIABETES MELLITUS, TYPE II 07/15/2008   Diplopia 07/15/2008   ECZEMA, ATOPIC 04/03/2009   GERD (gastroesophageal reflux disease)    Guillain-Barre (HCC) 1988   HYPERLIPIDEMIA 03/06/2009   HYPERTENSION 07/15/2008   Stroke (HCC) 2010, 2011   x2    Vertebral artery stenosis    No Known Allergies  Social History   Socioeconomic History   Marital status: Divorced    Spouse name: Not on file   Number of children: 0   Years of education: 13   Highest education level: Not on file  Occupational History   Occupation: custodian at [employer]  Tobacco Use   Smoking status: Never   Smokeless tobacco: Never  Vaping Use   Vaping Use: Never used  Substance and Sexual Activity   Alcohol use: No   Drug  use: No   Sexual activity: Not Currently  Other Topics Concern   Not on file  Social History Narrative   Right Handed    Lives in a one story home    No caffeine   Social Determinants of Health   Financial Resource Strain: Not on file  Food Insecurity: No Food Insecurity (09/24/2022)   Hunger Vital Sign    Worried About Running Out of Food in the Last Year: Never true    Ran Out of Food in the Last Year: Never true  Transportation Needs: No Transportation Needs (09/24/2022)   PRAPARE - Administrator, Civil Service (Medical): No    Lack of Transportation (Non-Medical): No  Physical Activity: Not on file  Stress: Not on file  Social Connections: Not on file    There were no vitals filed for this visit. There is no height or weight on file to calculate BMI.  Physical Exam  ASSESSMENT AND  PLAN:  There are no diagnoses linked to this encounter.  No orders of the defined types were placed in this encounter.   No problem-specific Assessment & Plan notes found for this encounter.   No follow-ups on file.  [name]. [place], MD  [facility]. [place] office.

## 2022-10-25 ENCOUNTER — Inpatient Hospital Stay: Payer: Commercial Managed Care - HMO | Admitting: Family Medicine

## 2022-10-28 ENCOUNTER — Ambulatory Visit: Payer: Commercial Managed Care - HMO | Admitting: Physician Assistant

## 2022-11-07 ENCOUNTER — Ambulatory Visit: Payer: Commercial Managed Care - HMO | Admitting: Physician Assistant

## 2022-11-28 ENCOUNTER — Other Ambulatory Visit (HOSPITAL_COMMUNITY): Payer: Self-pay | Admitting: Family Medicine

## 2022-11-28 ENCOUNTER — Encounter (HOSPITAL_COMMUNITY): Payer: Self-pay | Admitting: Family Medicine

## 2022-11-28 DIAGNOSIS — R590 Localized enlarged lymph nodes: Secondary | ICD-10-CM

## 2022-11-28 DIAGNOSIS — R591 Generalized enlarged lymph nodes: Secondary | ICD-10-CM

## 2022-12-09 ENCOUNTER — Ambulatory Visit (HOSPITAL_COMMUNITY): Payer: Commercial Managed Care - HMO

## 2022-12-13 ENCOUNTER — Encounter: Payer: Self-pay | Admitting: Neurology

## 2022-12-13 ENCOUNTER — Ambulatory Visit (INDEPENDENT_AMBULATORY_CARE_PROVIDER_SITE_OTHER): Payer: Commercial Managed Care - HMO | Admitting: Neurology

## 2022-12-13 VITALS — BP 118/75 | HR 100 | Ht 64.0 in | Wt 141.0 lb

## 2022-12-13 DIAGNOSIS — I679 Cerebrovascular disease, unspecified: Secondary | ICD-10-CM | POA: Diagnosis not present

## 2022-12-13 DIAGNOSIS — I69398 Other sequelae of cerebral infarction: Secondary | ICD-10-CM

## 2022-12-13 DIAGNOSIS — R252 Cramp and spasm: Secondary | ICD-10-CM

## 2022-12-13 DIAGNOSIS — I639 Cerebral infarction, unspecified: Secondary | ICD-10-CM

## 2022-12-13 MED ORDER — BACLOFEN 10 MG PO TABS
ORAL_TABLET | ORAL | 5 refills | Status: DC
Start: 2022-12-13 — End: 2023-10-10

## 2022-12-13 NOTE — Progress Notes (Signed)
Follow-up Visit   Date: 12/13/22   [name] MRN: [id number] DOB: 07-Jun-1960   Interim History: [name] is a 62 y.o. 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  She has history of prior stroke in 2010, pontine stroke (01/2021) and bilateral cerebellar stroke (08/2021).  The patient was accompanied to the clinic by caregiver.   IMPRESSION/PLAN: Spasticity involving the left arm, neck, and speech as a late effect of stroke.   - Start baclofen 5mg  BID x 2 weeks, then increase to 10mg  BID  - Stop methocarbamol  - Start speech therapy and occupational therapy  2.  Cerebrovascular disease with severe intracranial stenosis. She has history of multiple strokes (2010, pontine stroke in 2022, cerebellar stroke 2023, and bilateral medullary stroke and left cerebellar stroke).  She has been on a number of antiplatelet regiments (aspirin + plavix, plavix + pletal, and more recently aspirin + Brilinta).  She has completed 3 months of dual antiplatelet therapy with aspirin + Brilanta, and on monotherapy with aspirin 81mg  daily.  Appreciate PCP's care in secondary risk factor control.  Return to clinic in 3 months  -------------------------------------------------- UPDATE 10/04/2022:  She was hospitalized in January for right medullary stroke presenting with gait change, diplopia, and dizziness.  She was discharged on plavix + pletal.  In February, she was readmitted with slurred speech, shortness of breath, and worsening right side weakness with imaging showing extension of the right medullary stroke to the left side.    She was switched to aspirin + Brilinta and recommended to continue this for 3 months.   Since her hospitalization, her diabetes  is much better managed and HbA1c 11.4 > 6.7 and LDL is also improved from 782 > 81.  She has been as SNF and getting PT/OT.  She has noticed that her hand strength is starting to improve. She is antigravity in the right arm and had some movement in the left hand.  She was able to stand with assistance, which is improved.   She complains of odd sensation and tight sensation over the neck since her last stroke.   UPDATE 12/13/2022:  She reports having tight sensation in her throat and across her neck.  Her speech has become more strained and soft.  She cannot speak loudly.  She has better strength in the right arm and is able to hold onto objects.  She is able to raise the left arm, but has no movement in the hand and now fingers are clawing him.  She is nonambulatory and wheelchair-bound.  She was getting PT/OT but due to insurance limitations, she is no longer getting this.   Medications:  Current Outpatient Medications on File Prior to Visit  Medication Sig Dispense Refill   acetaminophen (TYLENOL) 325 MG tablet Take 650 mg by mouth every 4 (four) hours as needed for mild pain. Do not exceed 3000mg  in 24 hours     alum & mag hydroxide-simeth (MAALOX PLUS) 400-400-40 MG/5ML suspension Take 30 mLs by mouth QID. prn     amLODipine (NORVASC) 10 MG tablet Take 1 tablet by mouth once daily 90 tablet 1   antiseptic oral rinse (BIOTENE) LIQD 15 mLs by Mouth Rinse route 3 (three) times daily.     aspirin EC 81 MG tablet Take 1 tablet (81 mg total) by mouth daily.  Swallow whole. 30 tablet 12   atorvastatin (LIPITOR) 80 MG tablet Take 1 tablet (80 mg total) by mouth daily. 30 tablet 3   B Complex Vitamins (VITAMIN B COMPLEX) TABS Take 1 tablet by mouth every morning.     cyanocobalamin 1000 MCG tablet Take 1,000 mcg by mouth daily.     ezetimibe (ZETIA) 10 MG tablet Take 1 tablet (10 mg total) by mouth daily. 30 tablet 3   famotidine (PEPCID) 20 MG tablet Take 20 mg by mouth daily.     FLUoxetine (PROZAC) 20  MG capsule Take 1 capsule (20 mg total) by mouth daily. (Patient taking differently: Take 40 mg by mouth daily.) 30 capsule 0   hydrocortisone cream 1 % Apply 1 Application topically 2 (two) times daily.     hydrOXYzine (ATARAX) 10 MG tablet Take 1 tablet (10 mg total) by mouth 3 (three) times daily as needed for anxiety. (Patient taking differently: Take 25 mg by mouth 3 (three) times daily as needed for anxiety.) 30 tablet 0   LANTUS SOLOSTAR 100 UNIT/ML Solostar Pen Inject 5 Units into the skin daily. 15 mL 11   mesalamine (CANASA) 1000 MG suppository Place 1 suppository (1,000 mg total) rectally 2 (two) times daily. 30 suppository 12   methocarbamol (ROBAXIN) 500 MG tablet Take 500 mg by mouth 2 (two) times daily.     ONE TOUCH LANCETS MISC USE TO CHECK BLOOD SUGAR TWICE A DAY AND PRN 100 each 6   OXYGEN Inhale 2 L/min into the lungs as needed.     pantoprazole (PROTONIX) 40 MG tablet Take 1 tablet (40 mg total) by mouth daily. 30 tablet 0   polyethylene glycol (MIRALAX / GLYCOLAX) 17 g packet Take 17 g by mouth every other day. 14 each 0   senna-docusate (SENOKOT-S) 8.6-50 MG tablet Take 1 tablet by mouth daily.     sulfamethoxazole-trimethoprim (BACTRIM DS) 800-160 MG tablet Take 1 tablet by mouth 2 (two) times daily.     traMADol HCl 25 MG TABS Take 50 mg by mouth every 6 (six) hours as needed.     Dulaglutide (TRULICITY) 0.75 MG/0.5ML SOPN Inject 0.75 mg into the skin once a week. (Patient not taking: Reported on 12/13/2022) 6 mL 3   No current facility-administered medications on file prior to visit.    Allergies: No Known Allergies  Vital Signs:  BP 118/75   Pulse 100   Ht 5\' 4"  (1.626 m)   Wt 141 lb (64 kg)   SpO2 99%   BMI 24.20 kg/m   Neurological Exam: MENTAL STATUS including orientation to time, place, person, recent and remote memory, attention span and concentration, language, and fund of knowledge is normal.  Speech is midly dysarthric, soft, and moderately  spastic.  CRANIAL NERVES:   Pupils equal round and reactive to light.  Normal conjugate, extra-ocular eye movements in all directions of gaze.  No ptosis .  Face is symmetric. Palate elevates symmetrically.  Tongue is midline.  MOTOR:  Moderate atrophy of the hands, worse on the left.  Generalized loss of muscle bulk in the legs, worse on the left.   RUE  5/5 proximally, 4/5 in the hand LUE  3/5 proximally, 1/5 in the hand RLE  4/5 proximally and distally LLE   1/5 throughout  MSRs:  Reflexes are 2+/4 throughout.  SENSORY:  Vibration and temperature intact throughout.  COORDINATION/GAIT:  Gait is not tested, patient in wheelchair  Data: NCS/EMG of the upper extremities 06/14/2018: The electrophysiologic  findings are consistent with a sensorimotor polyneuropathy, with axonal and demyelinating features.  Overall, these findings are moderate in degree electrically.  MRI brain wo contrast 06/12/2022: 1. Increased size of a recent right medullary infarct which now extends to the left of midline. 2. New punctate acute left cerebellar infarct. 3. Mild chronic small vessel ischemic disease. Chronic cerebellar infarcts.  MRI/A head and neck 05/30/2022: 1. Small acute infarct in the right aspect of the medulla without hemorrhage or mass effect. 2. Background chronic small-vessel ischemic change with small remote infarcts in the bilateral cerebellar hemispheres, one of which on the left is new since 09/15/2021. 3. Prominent lymph nodes in the upper neck consistent with the history of lymphoma, overall decreased in size since the prior studies from 09/15/2021 but incompletely evaluated.   MRA HEAD   1. Diminutive enhancement of the right V4 segment with intermittent occlusion, and multifocal severe stenosis/near occlusion of the left V4 segment, similar to the prior CTA. 2. Additional intracranial atherosclerotic disease as detailed above resulting in moderate stenosis of the left  cavernous ICA, mild-to-moderate stenosis of the right supraclinoid ICA, severe stenosis of the origin of the left superior M2 division, severe stenosis of the distal right M1 segment, and severe stenosis of the proximal left P1 segment.   MRA NECK   1. 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 2. Moderate stenosis of the origin of the left vertebral artery, not significantly changed. 3. Patent carotid systems bilaterally.    Lab Results  Component Value Date   CHOL 126 06/13/2022   HDL 28 (L) 06/13/2022   LDLCALC 81 06/13/2022   LDLDIRECT 175.9 03/02/2012   TRIG 84 06/13/2022   CHOLHDL 4.5 06/13/2022   Lab Results  Component Value Date   HGBA1C 6.7 (A) 09/06/2022   Total time spent reviewing records, interview, history/exam, documentation, and coordination of care on day of encounter:  30 min    Thank you for allowing me to participate in patient's care.  If I can answer any additional questions, I would be pleased to do so.    Sincerely,     K. Allena Katz, DO

## 2022-12-13 NOTE — Patient Instructions (Addendum)
Stop methocarbamol  Start baclofen 5mg  twice daily x 2 weeks, then increase to 10mg  twice daily.  If it makes you too sleepy, reduce the dose.  Start speech therapy and occupational therapy  Return to clinic in 3 months

## 2022-12-16 NOTE — Progress Notes (Unsigned)
12/19/2022 [name] [id number] [birth year]  Referring provider: [place], [name], MD Primary GI doctor: Dr. 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 Repeat flex sig during hospitalization for pneumonia showed proctitis negative for chronic inflammation, possible medication/infection Has been on mesalamine suppositories with some improvement of her symptoms but now having diarrhea up to 5 x a day and continuing rectal pain Rectal exam negative for stools burden, with recent hospitalization will check for infection with diatherix.  Continue mesalamine BID, add on fiber Pending diatherix, get CBC, cmet, sed rate, thyroid Close  8 weeks  Chronic idiopathic constipation No stool on rectal exam, can consider KUB if labs negative  Anemia 10/16/2022  HGB 9.9 MCV 78.4 Platelets 291 Will repeat iron/B12   Patient Care Team: [place], [name], MD as PCP - General (Family Medicine) Glendale Chard, DO as Consulting Physician (Neurology)  HISTORY OF PRESENT ILLNESS: 63 y.o. female with a past medical history of non-Hodgkin's lymphoma, PVD, hypertension, diabetes, hyperlipidemia, multiple strokes on Brillinta, Guillain-Barre syndrome, GERD and others listed below presents for evaluation of hospital follow-up for painless hematochezia.[name] She was in the hospital from 3/6 to 3/11 for painless hematochezia while on Brilinta.  07/08/2022 colonoscopy due to rectal bleeding and anemia on Brilinta shows 3 mm polyp ascending, 3 mm polyp descending, polypoid changes sigmoid colon, tortuous colon, poor anal tone difficult to insufflate left colon/rectum, few ulcers in distal  rectum, internal hemorrhoids Pathology showed active proctitis/no evidence of chronicity, granuloma, dysplasia or malignancy.  Negative CMV Patient had office visit with [name] 10/2022 at that time had nausea, vomiting and shortness of breath, sent to ER for evaluation found to have bilateral multifocal pneumonia and treated for severe sepsis.   CT abdomen pelvis was unremarkable.  Was given Rocephin azithromycin and Flagyl. 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   Started on mesalamine suppositories.  She is by herself. She is in a wheelchair.  She states she continues to have rectal pain, states she has BM right before having a bowel movements, will last for about 1 min and then gets better.  She has loose small stools, can be small thin stools, has to strain with BM. She has a BM 4-5 x a day but can have a day without a BM.  She is still on the suppositories twice a day.  No blood in the stools. Occ lower AB pain, intermittent can be before BM and better after BM  She can have fecal incontinence.  No nausea, vomiting. No fever, chills.   She  reports that she has never smoked. She has never used smokeless tobacco. She reports that she does not drink alcohol and does not use drugs.  RELEVANT LABS AND IMAGING: CBC    Component Value Date/Time   WBC 6.0 10/16/2022 0018   RBC 3.93 10/16/2022 0018   HGB 9.9 (L) 10/16/2022 0018   HGB 11.3 (L) 09/12/2022 1134   HCT 30.8 (L) 10/16/2022 0018   PLT 291 10/16/2022 0018   PLT 329 09/12/2022 1134  MCV 78.4 (L) 10/16/2022 0018   MCH 25.2 (L) 10/16/2022 0018   MCHC 32.1 10/16/2022 0018   RDW 16.1 (H) 10/16/2022 0018   LYMPHSABS 2.9 10/05/2022 0058   MONOABS 1.3 (H) 10/05/2022 0058   EOSABS 0.0 10/05/2022 0058   BASOSABS 0.0  10/05/2022 0058   Recent Labs    10/07/22 0028 10/08/22 0022 10/09/22 0015 10/10/22 0024 10/11/22 0026 10/11/22 2355 10/12/22 2343 10/14/22 0023 10/15/22 0012 10/16/22 0018  HGB 9.8* 9.3* 10.1* 10.7* 10.6* 9.8* 9.7* 10.4* 10.5* 9.9*    CMP     Component Value Date/Time   NA 137 10/16/2022 0018   K 3.7 10/16/2022 0018   CL 108 10/16/2022 0018   CO2 22 10/16/2022 0018   GLUCOSE 101 (H) 10/16/2022 0018   GLUCOSE 129 (H) 04/28/2006 0951   BUN 7 (L) 10/16/2022 0018   CREATININE 0.77 10/16/2022 0018   CREATININE 0.80 09/12/2022 1134   CALCIUM 9.2 10/16/2022 0018   PROT 6.0 (L) 10/09/2022 0015   ALBUMIN 2.5 (L) 10/15/2022 0013   AST 23 10/09/2022 0015   AST 31 09/12/2022 1134   ALT 47 (H) 10/09/2022 0015   ALT 46 (H) 09/12/2022 1134   ALKPHOS 89 10/09/2022 0015   BILITOT 0.4 10/09/2022 0015   BILITOT 0.4 09/12/2022 1134   GFRNONAA >60 10/16/2022 0018   GFRNONAA >60 09/12/2022 1134   GFRAA >60 01/10/2020 1103      Latest Ref Rng & Units 10/15/2022   12:13 AM 10/09/2022   12:15 AM 10/07/2022   12:28 AM  Hepatic Function  Total Protein 6.5 - 8.1 g/dL  6.0  6.1   Albumin 3.5 - 5.0 g/dL 2.5  2.5  2.4   AST 15 - 41 U/L  23  28   ALT 0 - 44 U/L  47  69   Alk Phosphatase 38 - 126 U/L  89  96   Total Bilirubin 0.3 - 1.2 mg/dL  0.4  0.5       Current Medications:   Current Outpatient Medications (Endocrine & Metabolic):    Dulaglutide (TRULICITY) 0.75 MG/0.5ML SOPN, Inject 0.75 mg into the skin once a week.   LANTUS SOLOSTAR 100 UNIT/ML Solostar Pen, Inject 5 Units into the skin daily.  Current Outpatient Medications (Cardiovascular):    amLODipine (NORVASC) 10 MG tablet, Take 1 tablet by mouth once daily   atorvastatin (LIPITOR) 80 MG tablet, Take 1 tablet (80 mg total) by mouth daily.   ezetimibe (ZETIA) 10 MG tablet, Take 1 tablet (10 mg total) by mouth daily.   Current Outpatient Medications (Analgesics):    acetaminophen (TYLENOL) 325 MG tablet, Take 650 mg by  mouth every 4 (four) hours as needed for mild pain. Do not exceed 3000mg  in 24 hours   aspirin EC 81 MG tablet, Take 1 tablet (81 mg total) by mouth daily. Swallow whole.   traMADol HCl 25 MG TABS, Take 50 mg by mouth every 6 (six) hours as needed.  Current Outpatient Medications (Hematological):    cyanocobalamin 1000 MCG tablet, Take 1,000 mcg by mouth daily.  Current Outpatient Medications (Other):    alum & mag hydroxide-simeth (MAALOX PLUS) 400-400-40 MG/5ML suspension, Take 30 mLs by mouth QID. prn   antiseptic oral rinse (BIOTENE) LIQD, 15 mLs by Mouth Rinse route 3 (three) times daily.   B Complex Vitamins (VITAMIN B COMPLEX) TABS, Take 1 tablet by mouth every morning.   baclofen (LIORESAL) 10 MG tablet, Take 5mg  twice daily x 1 week, then increase  10mg  twice daily.  Stay on lower dose if patient gets too sleepy.   famotidine (PEPCID) 20 MG tablet, Take 20 mg by mouth daily.   FLUoxetine (PROZAC) 20 MG capsule, Take 1 capsule (20 mg total) by mouth daily. (Patient taking differently: Take 40 mg by mouth daily.)   hydrocortisone cream 1 %, Apply 1 Application topically 2 (two) times daily.   hydrOXYzine (ATARAX) 10 MG tablet, Take 1 tablet (10 mg total) by mouth 3 (three) times daily as needed for anxiety. (Patient taking differently: Take 25 mg by mouth 3 (three) times daily as needed for anxiety.)   mesalamine (CANASA) 1000 MG suppository, Place 1 suppository (1,000 mg total) rectally 2 (two) times daily.   ONE TOUCH LANCETS MISC, USE TO CHECK BLOOD SUGAR TWICE A DAY AND PRN   OXYGEN, Inhale 2 L/min into the lungs as needed.   polyethylene glycol (MIRALAX / GLYCOLAX) 17 g packet, Take 17 g by mouth every other day.   senna-docusate (SENOKOT-S) 8.6-50 MG tablet, Take 1 tablet by mouth daily.   sulfamethoxazole-trimethoprim (BACTRIM DS) 800-160 MG tablet, Take 1 tablet by mouth 2 (two) times daily.   pantoprazole (PROTONIX) 40 MG tablet, Take 1 tablet (40 mg total) by mouth  daily.  Medical History:  Past Medical History:  Diagnosis Date   Abnormality of gait 05/10/2010   BACK PAIN 11/14/2008   Class 1 obesity due to excess calories with body mass index (BMI) of 31.0 to 31.9 in adult 02/07/2021   DIABETES MELLITUS, TYPE II 07/15/2008   Diplopia 07/15/2008   ECZEMA, ATOPIC 04/03/2009   GERD (gastroesophageal reflux disease)    Guillain-Barre (HCC) 1988   HYPERLIPIDEMIA 03/06/2009   HYPERTENSION 07/15/2008   Stroke (HCC) 2010, 2011   x2    Vertebral artery stenosis    Allergies: No Known Allergies   Surgical History:  She  has a past surgical history that includes Abdominal hysterectomy; Foot surgery; Dilation and curettage of uterus; IR ANGIO INTRA EXTRACRAN SEL INTERNAL CAROTID UNI R MOD SED (09/17/2021); IR US Guide Vasc Access Right (09/17/2021); IR ANGIO VERTEBRAL SEL VERTEBRAL UNI R MOD SED (09/17/2021); IR ANGIO INTRA EXTRACRAN SEL COM CAROTID INNOMINATE UNI L MOD SED (09/17/2021); Colonoscopy with propofol (N/A, 07/08/2022); biopsy (07/08/2022); Flexible sigmoidoscopy (N/A, 10/12/2022); and biopsy (10/12/2022). Family History:  Her family history includes Asthma in an other family member; Cancer in her father; Diabetes in her mother and sister; Hypertension in an other family member; Stroke in her mother and another family member.  REVIEW OF SYSTEMS  : All other systems reviewed and negative except where noted in the History of Present Illness.  PHYSICAL EXAM: BP 122/68   Pulse 68   Ht 5\' 4"  (1.626 m)   Wt 144 lb (65.3 kg)   BMI 24.72 kg/m  General Appearance: Chronically ill-appearing in no apparent distress. Head:   Normocephalic and atraumatic. Eyes:  sclerae anicteric,conjunctive pink  Respiratory: Respiratory effort normal, BS equal bilaterally without rales, rhonchi, wheezing. Cardio: RRR with no MRGs.  Abdomen: Soft,  Obese ,active bowel sounds. No tenderness . [name] No masses. Rectal: Normal external rectal exam with some light brown stools at  rectum, no visible fissure, decreased rectal tone, mild tenderness, no masses, , brown stool Musculoskeletal: Decreased strength left lower legs and arms, in wheelchair, gait not evaluated.   Skin:  Dry and intact without significant lesions or rashes Neuro: Alert and  oriented x4; mild dysarthria, decreased strength left upper and lower extremity. Psych:  Cooperative. Normal  mood and flat   Doree Albee, PA-C 10:59 AM

## 2022-12-19 ENCOUNTER — Ambulatory Visit (INDEPENDENT_AMBULATORY_CARE_PROVIDER_SITE_OTHER): Payer: Commercial Managed Care - HMO | Admitting: Physician Assistant

## 2022-12-19 ENCOUNTER — Other Ambulatory Visit (INDEPENDENT_AMBULATORY_CARE_PROVIDER_SITE_OTHER): Payer: Commercial Managed Care - HMO

## 2022-12-19 ENCOUNTER — Encounter: Payer: Self-pay | Admitting: Physician Assistant

## 2022-12-19 VITALS — BP 122/68 | HR 68 | Ht 64.0 in | Wt 144.0 lb

## 2022-12-19 DIAGNOSIS — E538 Deficiency of other specified B group vitamins: Secondary | ICD-10-CM

## 2022-12-19 DIAGNOSIS — K5904 Chronic idiopathic constipation: Secondary | ICD-10-CM | POA: Diagnosis not present

## 2022-12-19 DIAGNOSIS — D649 Anemia, unspecified: Secondary | ICD-10-CM | POA: Diagnosis not present

## 2022-12-19 DIAGNOSIS — K6289 Other specified diseases of anus and rectum: Secondary | ICD-10-CM

## 2022-12-19 DIAGNOSIS — A09 Infectious gastroenteritis and colitis, unspecified: Secondary | ICD-10-CM

## 2022-12-19 LAB — SEDIMENTATION RATE: Sed Rate: 86 mm/hr — ABNORMAL HIGH (ref 0–30)

## 2022-12-19 LAB — TSH: TSH: 0.49 u[IU]/mL (ref 0.35–5.50)

## 2022-12-19 LAB — CBC WITH DIFFERENTIAL/PLATELET
Basophils Absolute: 0.1 10*3/uL (ref 0.0–0.1)
Basophils Relative: 1 % (ref 0.0–3.0)
Eosinophils Absolute: 0.4 10*3/uL (ref 0.0–0.7)
Eosinophils Relative: 6 % — ABNORMAL HIGH (ref 0.0–5.0)
HCT: 34 % — ABNORMAL LOW (ref 36.0–46.0)
Hemoglobin: 11.1 g/dL — ABNORMAL LOW (ref 12.0–15.0)
Lymphocytes Relative: 50.5 % — ABNORMAL HIGH (ref 12.0–46.0)
Lymphs Abs: 3.5 10*3/uL (ref 0.7–4.0)
MCHC: 32.5 g/dL (ref 30.0–36.0)
MCV: 80.3 fl (ref 78.0–100.0)
Monocytes Absolute: 0.7 10*3/uL (ref 0.1–1.0)
Monocytes Relative: 9.6 % (ref 3.0–12.0)
Neutro Abs: 2.3 10*3/uL (ref 1.4–7.7)
Neutrophils Relative %: 32.9 % — ABNORMAL LOW (ref 43.0–77.0)
Platelets: 326 10*3/uL (ref 150.0–400.0)
RBC: 4.23 Mil/uL (ref 3.87–5.11)
RDW: 20.6 % — ABNORMAL HIGH (ref 11.5–15.5)
WBC: 7 10*3/uL (ref 4.0–10.5)

## 2022-12-19 LAB — COMPREHENSIVE METABOLIC PANEL
ALT: 18 U/L (ref 0–35)
AST: 23 U/L (ref 0–37)
Albumin: 3 g/dL — ABNORMAL LOW (ref 3.5–5.2)
Alkaline Phosphatase: 113 U/L (ref 39–117)
BUN: 15 mg/dL (ref 6–23)
CO2: 23 mEq/L (ref 19–32)
Calcium: 9.7 mg/dL (ref 8.4–10.5)
Chloride: 109 mEq/L (ref 96–112)
Creatinine, Ser: 0.77 mg/dL (ref 0.40–1.20)
GFR: 81.99 mL/min (ref >60.00)
Glucose, Bld: 240 mg/dL — ABNORMAL HIGH (ref 70–99)
Potassium: 3.6 mEq/L (ref 3.5–5.1)
Sodium: 140 mEq/L (ref 135–145)
Total Bilirubin: 0.3 mg/dL (ref 0.2–1.2)
Total Protein: 6.7 g/dL (ref 6.0–8.3)

## 2022-12-19 LAB — VITAMIN B12: Vitamin B-12: >1501 pg/mL — ABNORMAL HIGH (ref 211–911)

## 2022-12-19 LAB — IBC + FERRITIN
Ferritin: 417.8 ng/mL — ABNORMAL HIGH (ref 10.0–291.0)
Iron: 82 ug/dL (ref 42–145)
Saturation Ratios: 26.6 % (ref 20.0–50.0)
TIBC: 308 ug/dL (ref 250.0–450.0)
Transferrin: 220 mg/dL (ref 212.0–360.0)

## 2022-12-19 NOTE — Patient Instructions (Addendum)
We have scheduled you a follow up on 04/10/2023 at 8:50am with Dr Lavon Paganini   Your provider has requested that you go to the basement level for lab work before leaving today. Press "B" on the elevator. The lab is located at the first door on the left as you exit the elevator.  Continue mesalamine suppositories twice a day  Proctitis  Proctitis is inflammation of the lining of the rectum. The rectum is at the end of the large intestine. The inflammation can cause pain and discomfort. It may be short-term and sudden (acute) or a long-term (chronic) problem. What are the causes? Proctitis may be caused by: Sexually transmitted infections (STIs). Infection. Trauma or injury to the rectum or to the opening of the butt (anus). Ulcerative colitis or Crohn's disease. Radiation therapy near the rectum. Antibiotics. What are the signs or symptoms? Symptoms of this condition include: The sudden need to poop. This may be uncomfortable. It may happen often. Pain in the anus or rectum. Bleeding from the rectum. Pain or cramping in the abdomen. Feeling like your rectum is full. Pus or mucus coming from the anus. Diarrhea or lots of soft, loose poop (stool). Constipation or pain when pooping. How is this diagnosed? This condition may be diagnosed based on your medical history and a physical exam. You may also have tests, such as: A test to check for an STI. Blood tests. Poop tests. A culture test. This is when a sample is taken from your rectum to look for bacteria. Anoscopy. This is a procedure to look at the anal canal. Colonoscopy or sigmoidoscopy. These are procedures to look at the large intestine. How is this treated? Treatment for proctitis depends on the cause. Treatment may include: Medicines to: Treat inflammation. These may be ointments or foams you can put on your rectum. They may also be enemas or medicines you can put in your rectum (suppositories). Treat infection or fight  bacteria, such as antibiotics. Fight a virus. These medicines are called antivirals. Help with diarrhea, hard poop, and pain. Reduce the activity of the body's disease-fighting system (immune system). Supplements. Staying away from the activity that caused trauma to the rectum. Heat or laser therapy. This may be used if bleeding does not go away. A procedure to make a narrow rectum wider. Surgery to fix the lining of the rectum. This is rare. If the proctitis was caused by an STI, you may need to get tested again for infection 3 months after treatment. Follow these instructions at home: Medicines Take over-the-counter and prescription medicines only as told by your health care provider. If you were prescribed antibiotics, take them as told by your provider. Do not stop using the antibiotic even if you start to feel better. Managing constipation Proctitis may cause constipation. To prevent or treat constipation, you may need to: Drink enough fluid to keep your pee (urine) pale yellow. Take over-the-counter or prescription medicines. Eat foods that are high in fiber, such as beans, whole grains, and fresh fruits and vegetables. Limit foods that are high in fat and processed sugars, such as fried or sweet foods. General instructions  Take sitz baths as told by your provider. A sitz bath is a shallow, warm water bath that you sit down in. The water should only come up to your hips and should cover your butt. Try not to eat right before bed. Keep all follow-up visits. Your provider will need to make sure your condition is improving. Contact a health care provider  if: Your symptoms do not get better with treatment. Your symptoms get worse. You have a fever. Get help right away if: You have blood in your poop. There is blood coming from your rectum. This information is not intended to replace advice given to you by your health care provider. Make sure you discuss any questions you have with  your health care provider. Document Revised: 12/06/2021 Document Reviewed: 12/06/2021 Elsevier Patient Education  2024 [employer]    _______________________________________________________  If your blood pressure at your visit was 140/90 or greater, please contact your primary care physician to follow up on this.  _______________________________________________________  If you are age 78 or older, your body mass index should be between 23-30. Your Body mass index is 24.72 kg/m. If this is out of the aforementioned range listed, please consider follow up with your Primary Care Provider.  If you are age 34 or younger, your body mass index should be between 19-25. Your Body mass index is 24.72 kg/m. If this is out of the aformentioned range listed, please consider follow up with your Primary Care Provider.   ________________________________________________________  The [place] [facility] providers would like to encourage you to use [facility] to communicate with providers for non-urgent requests or questions.  Due to long hold times on the telephone, sending your provider a message by [facility] may be a faster and more efficient way to get a response.  Please allow 48 business hours for a response.  Please remember that this is for non-urgent requests.  _______________________________________________________' It was a pleasure to see you today!  Thank you for trusting me with your gastrointestinal care!

## 2022-12-21 ENCOUNTER — Encounter (HOSPITAL_COMMUNITY): Payer: Self-pay

## 2022-12-21 ENCOUNTER — Telehealth: Payer: Self-pay | Admitting: Physician Assistant

## 2022-12-21 ENCOUNTER — Ambulatory Visit (HOSPITAL_COMMUNITY): Payer: Commercial Managed Care - HMO

## 2022-12-21 NOTE — Telephone Encounter (Signed)
Has positive for salmonella enterica and Cdiff, has different DOB, different provider, and only Arlyn BF however the day and time collected is consistent when this patient is here. With her symptoms, will treat however, will continue to look into this information/Lab.

## 2022-12-21 NOTE — Telephone Encounter (Signed)
Agree with the plan. Marchelle Folks if she has persistent symptoms or develops recurrent CHF, she would consider oral microbiome therapy with [facility]. Thanks

## 2022-12-21 NOTE — Telephone Encounter (Signed)
-----   Message from [facility] sent at 12/21/2022  1:08 PM EDT ----- I think I found it.  It looks like its a Dr. Lavon Paganini patient that [name] saw. ----- Message ----- From: Meryl Dare, MD Sent: 12/21/2022   9:30 AM EDT To: Benancio Deeds, MD; Cooper Render, CMA  Velna Hatchet with [facility] [phone] was the info in the text page. Were you able to track down the patient? ----- Message ----- From: Benancio Deeds, MD Sent: 12/21/2022   8:11 AM EDT To: Meryl Dare, MD; Cooper Render, CMA  Okay thanks for letting me know. This is odd. Jan can you remember this name at all? Not sure if they had the right patient or not? Name does not sound familiar. ----- Message ----- From: Meryl Dare, MD Sent: 12/20/2022   7:04 PM EDT To: Benancio Deeds, MD  Brett Canales, I receive a message on call regarding a positive C diff on your patient [name] [id number] however I cannot locate this patient in Epic. I tried several spellings without success of a matching DOB. Judie Petit

## 2022-12-21 NOTE — Telephone Encounter (Signed)
Will scan in lab report. Patient with proctitis, rectal pain, diarrhea after hospitalization. Positive [facility] C. difficile. Patient is high risk for reoccurrence, would suggest dificin 200mg  BID for 14 days if we are able to get this covered, if not can do vancomycin 125gm 4 x a day for 14 days #56 Start on florastor 250mg  1 cap BID for 1 month Hand washing, wiping down surfaces, and avoiding contamination discussed with the patient.  Avoid ABX in the future Follow up 8 weeks Follow up if any worsening symptoms or no improving symptoms. -Go to the ER if bloody stools,not peeing regularly, unable to take oral fluids, vomiting, high fever, severe weakness, abdominal pain, shortness of breath or chest pain.

## 2022-12-21 NOTE — Telephone Encounter (Signed)
Marchelle Folks thanks for following up on this. Adding Dr. Lavon Paganini I think this may be her patient. Thanks

## 2022-12-21 NOTE — Telephone Encounter (Signed)
Left message for pt to call back  °

## 2022-12-22 NOTE — Telephone Encounter (Signed)
Spoke with pts sister and pt is currently at [facility] nursing facility. Called multiple times and finally was able to leave a message on the Director of Nursing's voicemail. Awaiting a call back.

## 2022-12-23 NOTE — Telephone Encounter (Signed)
Pam called stated that she needs results for C-diff. Fax number [phone] att Pam. Please advise

## 2022-12-23 NOTE — Telephone Encounter (Signed)
Spoke with Gilda Crease LPN at [facility] and informed her of the test results. She took verbal order for Dificid 200mg  BID for 14 days along with Florastor 250mg  BID for 1 month. She read back the orders and confirmed them and states they will get the medication started.

## 2022-12-23 NOTE — Telephone Encounter (Signed)
Pam is requesting lab report sent to a different fax [phone]

## 2022-12-23 NOTE — Telephone Encounter (Signed)
Lab report faxed as requested.

## 2022-12-23 NOTE — Telephone Encounter (Signed)
Result faxed again to additional fax number.l

## 2023-01-05 ENCOUNTER — Emergency Department (HOSPITAL_COMMUNITY): Payer: Commercial Managed Care - HMO

## 2023-01-05 ENCOUNTER — Emergency Department (HOSPITAL_COMMUNITY)
Admission: EM | Admit: 2023-01-05 | Discharge: 2023-01-05 | Disposition: A | Discharge status: Home / Self Care | Payer: Commercial Managed Care - HMO | Attending: Student | Admitting: Student

## 2023-01-05 ENCOUNTER — Encounter (HOSPITAL_COMMUNITY): Payer: Self-pay

## 2023-01-05 DIAGNOSIS — Z7982 Long term (current) use of aspirin: Secondary | ICD-10-CM | POA: Insufficient documentation

## 2023-01-05 DIAGNOSIS — I6503 Occlusion and stenosis of bilateral vertebral arteries: Secondary | ICD-10-CM | POA: Diagnosis not present

## 2023-01-05 DIAGNOSIS — R6884 Jaw pain: Secondary | ICD-10-CM | POA: Diagnosis not present

## 2023-01-05 DIAGNOSIS — R519 Headache, unspecified: Secondary | ICD-10-CM | POA: Diagnosis not present

## 2023-01-05 DIAGNOSIS — N1832 Chronic kidney disease, stage 3b: Secondary | ICD-10-CM | POA: Diagnosis not present

## 2023-01-05 DIAGNOSIS — Z79899 Other long term (current) drug therapy: Secondary | ICD-10-CM | POA: Diagnosis not present

## 2023-01-05 DIAGNOSIS — R531 Weakness: Secondary | ICD-10-CM | POA: Diagnosis not present

## 2023-01-05 DIAGNOSIS — E1122 Type 2 diabetes mellitus with diabetic chronic kidney disease: Secondary | ICD-10-CM | POA: Insufficient documentation

## 2023-01-05 DIAGNOSIS — C8191 Hodgkin lymphoma, unspecified, lymph nodes of head, face, and neck: Secondary | ICD-10-CM | POA: Diagnosis not present

## 2023-01-05 DIAGNOSIS — I129 Hypertensive chronic kidney disease with stage 1 through stage 4 chronic kidney disease, or unspecified chronic kidney disease: Secondary | ICD-10-CM | POA: Insufficient documentation

## 2023-01-05 DIAGNOSIS — R079 Chest pain, unspecified: Secondary | ICD-10-CM | POA: Diagnosis not present

## 2023-01-05 DIAGNOSIS — M542 Cervicalgia: Secondary | ICD-10-CM | POA: Diagnosis present

## 2023-01-05 LAB — CBC WITH DIFFERENTIAL/PLATELET
Abs Immature Granulocytes: 0.03 10*3/uL (ref 0.00–0.07)
Basophils Absolute: 0.1 10*3/uL (ref 0.0–0.1)
Basophils Relative: 1 %
Eosinophils Absolute: 0.4 10*3/uL (ref 0.0–0.5)
Eosinophils Relative: 5 %
HCT: 34.5 % — ABNORMAL LOW (ref 36.0–46.0)
Hemoglobin: 11.3 g/dL — ABNORMAL LOW (ref 12.0–15.0)
Immature Granulocytes: 0 %
Lymphocytes Relative: 41 %
Lymphs Abs: 3.6 10*3/uL (ref 0.7–4.0)
MCH: 27 pg (ref 26.0–34.0)
MCHC: 32.8 g/dL (ref 30.0–36.0)
MCV: 82.3 fL (ref 80.0–100.0)
Monocytes Absolute: 1.2 10*3/uL — ABNORMAL HIGH (ref 0.1–1.0)
Monocytes Relative: 14 %
Neutro Abs: 3.3 10*3/uL (ref 1.7–7.7)
Neutrophils Relative %: 39 %
Platelets: 245 10*3/uL (ref 150–400)
RBC: 4.19 MIL/uL (ref 3.87–5.11)
RDW: 18.9 % — ABNORMAL HIGH (ref 11.5–15.5)
WBC Morphology: ABNORMAL
WBC: 8.6 10*3/uL (ref 4.0–10.5)
nRBC: 0 % (ref 0.0–0.2)

## 2023-01-05 LAB — PATHOLOGIST SMEAR REVIEW

## 2023-01-05 LAB — BASIC METABOLIC PANEL
Anion gap: 6 (ref 5–15)
BUN: 12 mg/dL (ref 8–23)
CO2: 26 mmol/L (ref 22–32)
Calcium: 9.1 mg/dL (ref 8.9–10.3)
Chloride: 107 mmol/L (ref 98–111)
Creatinine, Ser: 0.69 mg/dL (ref 0.44–1.00)
GFR, Estimated: >60 mL/min (ref >60)
Glucose, Bld: 172 mg/dL — ABNORMAL HIGH (ref 70–99)
Potassium: 4.1 mmol/L (ref 3.5–5.1)
Sodium: 139 mmol/L (ref 135–145)

## 2023-01-05 LAB — TROPONIN I (HIGH SENSITIVITY)
Troponin I (High Sensitivity): 11 ng/L (ref <18)
Troponin I (High Sensitivity): 11 ng/L (ref <18)

## 2023-01-05 MED ORDER — PROCHLORPERAZINE EDISYLATE 10 MG/2ML IJ SOLN
10.0000 mg | Freq: Once | INTRAMUSCULAR | Status: AC
  Administered 2023-01-05: 10 mg via INTRAVENOUS
  Filled 2023-01-05: qty 2

## 2023-01-05 MED ORDER — LIDOCAINE 5 % EX PTCH
1.0000 | MEDICATED_PATCH | CUTANEOUS | Status: DC
  Administered 2023-01-05: 1 via TRANSDERMAL
  Filled 2023-01-05: qty 1

## 2023-01-05 MED ORDER — DIPHENHYDRAMINE HCL 50 MG/ML IJ SOLN
25.0000 mg | Freq: Once | INTRAMUSCULAR | Status: AC
  Administered 2023-01-05: 25 mg via INTRAVENOUS
  Filled 2023-01-05: qty 1

## 2023-01-05 MED ORDER — LIDOCAINE 5 % EX PTCH: 1.0000 | MEDICATED_PATCH | CUTANEOUS | 0 refills | Status: DC

## 2023-01-05 MED ORDER — IOHEXOL 350 MG/ML SOLN
75.0000 mL | Freq: Once | INTRAVENOUS | Status: AC | PRN
  Administered 2023-01-05: 75 mL via INTRAVENOUS

## 2023-01-05 MED ORDER — LACTATED RINGERS IV BOLUS
1000.0000 mL | Freq: Once | INTRAVENOUS | Status: AC
  Administered 2023-01-05: 1000 mL via INTRAVENOUS

## 2023-01-05 NOTE — ED Notes (Signed)
Report given to [facility] at [facility] by this RN

## 2023-01-05 NOTE — ED Triage Notes (Signed)
Pt BIB EMS from [facility] with c/o neck, jaw, and head pain that has been ongoing since pt had a stroke in February. Pain worse in morning upon awakening. Pt also c/o chest pain since 0500. Denies nausea, vomiting or SOB. GCS 15. Pt wheelchair bound.   EMS Vitals HR 96 BP 154/82 O2 97% room air CBG 184

## 2023-01-05 NOTE — ED Notes (Signed)
Patient transported to MRI 

## 2023-01-05 NOTE — ED Notes (Signed)
Phlebotomy to obtain troponin.

## 2023-01-05 NOTE — ED Notes (Signed)
Pt well appearing upon discharge.

## 2023-01-05 NOTE — ED Notes (Signed)
[facility] called, eta "not long, should be next"

## 2023-01-05 NOTE — ED Provider Notes (Signed)
[facility] EMERGENCY DEPARTMENT AT [facility] Provider Note  CSN: [id number] Arrival date & time: 01/05/23 8841  Chief Complaint(s) Neck Pain, Jaw Pain, Chest Pain, and Headache  HPI [name] is a 62 y.o. female with PMH PMH T2DM, HTN, HLD, CVA with residual left-sided deficits, dysarthria, recent hospital admission for pneumonia and proctitis who presents emergency department for evaluation of neck pain, jaw pain, chest pain and headache.  Patient currently a resident at [facility] and states that symptoms have been intermittent since her stroke in January 2024 but this morning were significantly worse in intensity.  States that chest pain is new and started at 5 AM this morning.  Denies abdominal pain, nausea, vomiting or other systemic symptoms.  Denies numbness, tingling or new weakness.  Denies visual changes.   Past Medical History Past Medical History:  Diagnosis Date   Abnormality of gait 05/10/2010   BACK PAIN 11/14/2008   Class 1 obesity due to excess calories with body mass index (BMI) of 31.0 to 31.9 in adult 02/07/2021   DIABETES MELLITUS, TYPE II 07/15/2008   Diplopia 07/15/2008   ECZEMA, ATOPIC 04/03/2009   GERD (gastroesophageal reflux disease)    Guillain-Barre (HCC) 1988   HYPERLIPIDEMIA 03/06/2009   HYPERTENSION 07/15/2008   Stroke (HCC) 2010, 2011   x2    Vertebral artery stenosis    Patient Active Problem List   Diagnosis Date Noted   Protein-calorie malnutrition, severe 10/08/2022   Severe sepsis (HCC) 10/05/2022   Dehydration 10/05/2022   Acute prerenal azotemia 10/05/2022   Proctitis 10/05/2022   Hypercalcemia 10/05/2022   Transaminitis 10/05/2022   Pyuria 10/05/2022   Depression 10/05/2022   CAP (community acquired pneumonia) 10/04/2022   Malnutrition of moderate degree 09/26/2022   SOB (shortness of breath) 09/25/2022   Nausea vomiting and diarrhea 09/24/2022   Shortness of breath 09/24/2022   Acute respiratory failure with  hypoxia (HCC) 09/24/2022   LFT elevation 09/24/2022   Diabetes mellitus (HCC) 09/06/2022   Rectal ulcer 07/09/2022   Colon ulcer 07/08/2022   Rectal bleeding 07/07/2022   Anemia 07/07/2022   GI bleed 07/06/2022   Dysarthria 06/12/2022   Aspiration pneumonia (HCC) 06/12/2022   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   Anxiety disorder due to medical condition 06/10/2022   AKI (acute kidney injury) (HCC) 06/01/2022   Dizziness 05/29/2022   Personal history of CLL (chronic lymphocytic leukemia) 05/29/2022   Rectal pain 05/29/2022   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 disease, with long-term current use of insulin (HCC) 04/14/2022   Type 2 diabetes mellitus with diabetic polyneuropathy, with long-term current use of insulin (HCC) 04/14/2022   Antiplatelet or antithrombotic long-term use 03/16/2022   HLD (hyperlipidemia) 10/01/2021   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HCC) 09/17/2021   Stage 3a chronic kidney disease (CKD) (HCC) 09/16/2021   Thyroid nodule 09/16/2021   Cervical lymphadenopathy 09/16/2021   Acute CVA (cerebrovascular accident) (HCC) 09/15/2021   GERD (gastroesophageal reflux disease) 04/30/2021   Peripheral arterial disease (HCC) 04/13/2021   Atherosclerosis of aorta (HCC) 02/14/2021   Cerebral thrombosis with cerebral infarction 02/08/2021   Abnormal MRI of head 02/07/2021   Vertigo 02/07/2021   Class 1 obesity due to excess calories with body mass index (BMI) of 31.0 to 31.9 in adult 02/07/2021   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   Trigger finger, left ring finger 10/04/2018   Alternating constipation and diarrhea 04/20/2018   Diabetic peripheral neuropathy associated with type 2 diabetes mellitus (HCC) 02/26/2018  DM2 (diabetes mellitus, type 2) (HCC) 07/29/2015   ABNORMALITY OF GAIT 05/10/2010   ECZEMA, ATOPIC 04/03/2009   Hyperlipidemia associated with type 2 diabetes mellitus (HCC) 03/06/2009   BACK PAIN 11/14/2008   Hemiparesis  affecting left side as late effect of stroke (HCC) 08/05/2008   DIPLOPIA 07/15/2008   Essential hypertension 07/15/2008   Home Medication(s) Prior to Admission medications   Medication Sig Start Date End Date Taking? Authorizing Provider  lidocaine (LIDODERM) 5 % Place 1 patch onto the skin daily. Remove & Discard patch within 12 hours or as directed by MD 01/05/23  Yes Braydyn Schultes, MD  acetaminophen (TYLENOL) 325 MG tablet Take 650 mg by mouth every 4 (four) hours as needed for mild pain. Do not exceed 3000mg  in 24 hours    [provider]  alum & mag hydroxide-simeth (MAALOX PLUS) 400-400-40 MG/5ML suspension Take 30 mLs by mouth QID. prn    [provider]  amLODipine (NORVASC) 10 MG tablet Take 1 tablet by mouth once daily 05/09/22   [place], [name], MD  antiseptic oral rinse (BIOTENE) LIQD 15 mLs by Mouth Rinse route 3 (three) times daily.    [provider]  aspirin EC 81 MG tablet Take 1 tablet (81 mg total) by mouth daily. Swallow whole. 06/18/22   Osvaldo Shipper, MD  atorvastatin (LIPITOR) 80 MG tablet Take 1 tablet (80 mg total) by mouth daily. 05/09/22   [place], [name], MD  B Complex Vitamins (VITAMIN B COMPLEX) TABS Take 1 tablet by mouth every morning.    [provider]  baclofen (LIORESAL) 10 MG tablet Take 5mg  twice daily x 1 week, then increase 10mg  twice daily.  Stay on lower dose if patient gets too sleepy. 12/13/22   Nita Sickle K, DO  cyanocobalamin 1000 MCG tablet Take 1,000 mcg by mouth daily.    [provider]  Dulaglutide (TRULICITY) 0.75 MG/0.5ML SOPN Inject 0.75 mg into the skin once a week. 04/13/22   Shamleffer, Konrad Dolores, MD  ezetimibe (ZETIA) 10 MG tablet Take 1 tablet (10 mg total) by mouth daily. 05/09/22   [place], [name], MD  famotidine (PEPCID) 20 MG tablet Take 20 mg by mouth daily.    [provider]  FLUoxetine (PROZAC) 20 MG capsule Take 1 capsule (20 mg total) by mouth daily. Patient taking  differently: Take 40 mg by mouth daily. 09/27/22   Rhetta Mura, MD  hydrocortisone cream 1 % Apply 1 Application topically 2 (two) times daily.    [provider]  hydrOXYzine (ATARAX) 10 MG tablet Take 1 tablet (10 mg total) by mouth 3 (three) times daily as needed for anxiety. Patient taking differently: Take 25 mg by mouth 3 (three) times daily as needed for anxiety. 09/27/22   Rhetta Mura, MD  LANTUS SOLOSTAR 100 UNIT/ML Solostar Pen Inject 5 Units into the skin daily. 10/17/22   Hughie Closs, MD  mesalamine (CANASA) 1000 MG suppository Place 1 suppository (1,000 mg total) rectally 2 (two) times daily. 10/17/22   Hughie Closs, MD  ONE TOUCH LANCETS MISC USE TO CHECK BLOOD SUGAR TWICE A DAY AND PRN 07/29/15   Gordy Savers, MD  OXYGEN Inhale 2 L/min into the lungs as needed.    [provider]  pantoprazole (PROTONIX) 40 MG tablet Take 1 tablet (40 mg total) by mouth daily. 10/17/22 12/13/22  Hughie Closs, MD  polyethylene glycol (MIRALAX / GLYCOLAX) 17 g packet Take 17 g by mouth every other day. 09/27/22  Rhetta Mura, MD  senna-docusate (SENOKOT-S) 8.6-50 MG tablet Take 1 tablet by mouth daily.    [provider]  sulfamethoxazole-trimethoprim (BACTRIM DS) 800-160 MG tablet Take 1 tablet by mouth 2 (two) times daily.    [provider]  traMADol HCl 25 MG TABS Take 50 mg by mouth every 6 (six) hours as needed. 11/16/22   [provider]                                                                                                                                    Past Surgical History Past Surgical History:  Procedure Laterality Date   ABDOMINAL HYSTERECTOMY     BIOPSY  07/08/2022   Procedure: BIOPSY;  Surgeon: Benancio Deeds, MD;  Location: [facility] ENDOSCOPY;  Service: Gastroenterology;;   BIOPSY  10/12/2022   Procedure: BIOPSY;  Surgeon: Shellia Cleverly, DO;  Location: [facility] ENDOSCOPY;  Service: Gastroenterology;;    COLONOSCOPY WITH PROPOFOL N/A 07/08/2022   Procedure: COLONOSCOPY WITH PROPOFOL;  Surgeon: Benancio Deeds, MD;  Location: [facility] ENDOSCOPY;  Service: Gastroenterology;  Laterality: N/A;   DILATION AND CURETTAGE OF UTERUS     FLEXIBLE SIGMOIDOSCOPY N/A 10/12/2022   Procedure: FLEXIBLE SIGMOIDOSCOPY;  Surgeon: Shellia Cleverly, DO;  Location: [facility] ENDOSCOPY;  Service: Gastroenterology;  Laterality: N/A;   FOOT SURGERY     IR ANGIO INTRA EXTRACRAN SEL COM CAROTID INNOMINATE UNI L MOD SED  09/17/2021   IR ANGIO INTRA EXTRACRAN SEL INTERNAL CAROTID UNI R MOD SED  09/17/2021   IR ANGIO VERTEBRAL SEL VERTEBRAL UNI R MOD SED  09/17/2021   IR US GUIDE VASC ACCESS RIGHT  09/17/2021   Family History Family History  Problem Relation Age of Onset   Diabetes Sister    Asthma Other    Stroke Other    Hypertension Other    Diabetes Mother    Stroke Mother    Cancer Father        pt states hae had some kind of stomach cancer, ? stomach or colon   Breast cancer Neg Hx     Social History Social History   Tobacco Use   Smoking status: Never   Smokeless tobacco: Never  Vaping Use   Vaping status: Never Used  Substance Use Topics   Alcohol use: No   Drug use: No   Allergies Patient has no known allergies.  Review of Systems Review of Systems  Cardiovascular:  Positive for chest pain.  Neurological:  Positive for headaches.    Physical Exam Vital Signs  I have reviewed the triage vital signs BP (!) 168/100   Pulse 97   Temp 98 F (36.7 C) (Oral)   Resp 18   SpO2 100%   Physical Exam Vitals and nursing note reviewed.  Constitutional:      General: She is not in acute distress.    Appearance: She is well-developed.  HENT:  Head: Normocephalic and atraumatic.  Eyes:     Conjunctiva/sclera: Conjunctivae normal.  Cardiovascular:     Rate and Rhythm: Normal rate and regular rhythm.     Heart sounds: No murmur heard. Pulmonary:     Effort: Pulmonary effort is normal. No  respiratory distress.     Breath sounds: Normal breath sounds.  Abdominal:     Palpations: Abdomen is soft.     Tenderness: There is no abdominal tenderness.  Musculoskeletal:        General: No swelling.     Cervical back: Neck supple.  Skin:    General: Skin is warm and dry.     Capillary Refill: Capillary refill takes less than 2 seconds.  Neurological:     Mental Status: She is alert. Mental status is at baseline.     Sensory: Sensory deficit present.     Motor: Weakness present.  Psychiatric:        Mood and Affect: Mood normal.     ED Results and Treatments Labs (all labs ordered are listed, but only abnormal results are displayed) Labs Reviewed  CBC WITH DIFFERENTIAL/PLATELET - Abnormal; Notable for the following components:      Result Value   Hemoglobin 11.3 (*)    HCT 34.5 (*)    RDW 18.9 (*)    Monocytes Absolute 1.2 (*)    All other components within normal limits  BASIC METABOLIC PANEL - Abnormal; Notable for the following components:   Glucose, Bld 172 (*)    All other components within normal limits  PATHOLOGIST SMEAR REVIEW  TROPONIN I (HIGH SENSITIVITY)  TROPONIN I (HIGH SENSITIVITY)                                                                                                                          Radiology MR ANGIO HEAD WO CONTRAST  Result Date: 01/05/2023 CLINICAL DATA:  62 year old female with head neck and jaw pain since a stroke in February. Right medullary and left cerebellar infarcts at that time. History of lymphoma. Abnormal CTA head and neck this morning. EXAM: MRA HEAD WITHOUT CONTRAST TECHNIQUE: Angiographic images of the Circle of Willis were acquired using MRA technique without intravenous contrast. COMPARISON:  CTA this morning. Previous intracranial MRA 05/30/2022. FINDINGS: Anterior circulation: Stable antegrade flow in both ICA siphons. MCA and ACA origins remain patent. Visible bilateral MCA and ACA branches appear stable to the  January MRA. See additional details on CTA today reported separately. Posterior circulation: Loss of the antegrade flow in the distal left vertebral artery which was irregular but maintained in January. The left PICA appears to remain normal. Chronic distal right vertebral artery occlusion. Loss of the majority of the antegrade flow signal in the basilar artery now. And only faint/minimal SCA and PCA flow, concordant with CTA today. Compare series 1041, image 9 of this exam to series 1059, image 9 in January. Anatomic variants: As per CTA today. Other: Brain MRI today is reported separately.  IMPRESSION: 1. Abnormal Posterior Circulation concordant with CTA 0813 hours today: Loss of most posterior circulation flow signal in the setting of chronically occluded distal right vertebral, and interval occluded left vertebral artery V4 segments. Minimal flow detected now in the Basilar, SCAs and PCAs. The left PICA origin remains patent. 2. Stable MRI appearance of the Anterior Circulation since January, see anterior circulation atherosclerosis details on CTA this morning. 3. MRI today reported separately. Electronically Signed   By: Odessa Fleming M.D.   On: 01/05/2023 12:43   MR BRAIN WO CONTRAST  Result Date: 01/05/2023 CLINICAL DATA:  62 year old female with head neck and jaw pain since a stroke in 06/17/22. Right medullary and left cerebellar infarcts at that time. History of lymphoma. Abnormal CTA head and neck this morning. EXAM: MRI HEAD WITHOUT CONTRAST TECHNIQUE: Multiplanar, multiecho pulse sequences of the brain and surrounding structures were obtained without intravenous contrast. COMPARISON:  CTA head and neck, head CT this morning reported separately. Previous brain MRI and intracranial MRA 05/30/2022. brain MRI 06/12/2022. FINDINGS: Brain: No restricted diffusion or evidence of acute infarction now. V-shaped ventral medullary and also right medullary pyramid encephalomalacia has progressed as expected since  2022/06/17. A small left pontine lacunar infarct is more apparent on series 6, image 10 today but was present in 06/17/22. But a small linear chronic right posterior cerebellar infarct on series 6, image 6 appears to be new since that time. Otherwise patchy asymmetric bilateral cerebral white matter T2 and FLAIR hyperintensity appears stable since 17-Jun-2022, most pronounced in the right parietal lobe. No chronic cerebral blood products by T2 *. No midline shift, mass effect, evidence of mass lesion, ventriculomegaly, extra-axial collection or acute intracranial hemorrhage. Cervicomedullary junction and pituitary are within normal limits. Vascular: Anterior circulation major vascular flow voids appear stable since 06/12/2022. Increasing loss of distal vertebral artery and vertebrobasilar junction flow voids since that time (series 6, image 7), concordant with CTA findings today. Follow-up MRA today is reported separately. Skull and upper cervical spine: Negative visible cervical spine. Visualized bone marrow signal is within normal limits. Sinuses/Orbits: Negative. Other: Positive for diffuse bilateral cervical lymphadenopathy. Which when compared to sagittal T1 imaging in 2022-06-17 appears progressed, concordant with CTA findings today. IMPRESSION: 1. Negative for acute infarct. 2. Expected evolution of medullary encephalomalacia since 06/17/22. New small but chronic right cerebellar infarcts since that time. Other brainstem and left cerebellar ischemia appears stable. 3. Progression of generalized cervical lymphadenopathy compatible with Active Lymphoma as on CTA today. 4. Loss of normal distal vertebral and proximal basilar artery flow voids concordant with CTA also. MRA today is reported separately. Electronically Signed   By: Odessa Fleming M.D.   On: 01/05/2023 12:38   CT ANGIO HEAD NECK W WO CM  Addendum Date: 01/05/2023   ADDENDUM REPORT: 01/05/2023 08:53 ADDENDUM: Salient findings discussed by telephone with Dr.  Glendora Score on 01/05/2023 at 0845 hours. Electronically Signed   By: Odessa Fleming M.D.   On: 01/05/2023 08:53   Result Date: 01/05/2023 CLINICAL DATA:  62 year old female neurologic deficit, headache, neck pain. "Recent stroke" in 17-Jun-2022. Chronic distal right vertebral artery occlusion. History of non-Hodgkin's lymphoma. EXAM: CT ANGIOGRAPHY HEAD AND NECK WITH AND WITHOUT CONTRAST TECHNIQUE: Multidetector CT imaging of the head and neck was performed using the standard protocol during bolus administration of intravenous contrast. Multiplanar CT image reconstructions and MIPs were obtained to evaluate the vascular anatomy. Carotid stenosis measurements (when applicable) are obtained utilizing NASCET criteria, using the distal internal carotid diameter  as the denominator. RADIATION DOSE REDUCTION: This exam was performed according to the departmental dose-optimization program which includes automated exposure control, adjustment of the mA and/or kV according to patient size and/or use of iterative reconstruction technique. CONTRAST:  75mL OMNIPAQUE IOHEXOL 350 MG/ML SOLN COMPARISON:  Brain MRI, head and neck MRA 05/30/2022. Most recent brain MRI 06/12/2022. Most recent head CT 08/21/2022. CTA head and neck 09/15/2021.  Neck CT 10/15/2019.  Neck CT 08/21/22. FINDINGS: CT HEAD Brain: Calcified atherosclerosis at the skull base. Right side and ventral medullary encephalomalacia better demonstrated by MRI. Small chronic central left cerebellar infarct also redemonstrated. Stable cerebral volume. No midline shift, ventriculomegaly, mass effect, evidence of mass lesion, intracranial hemorrhage or evidence of cortically based acute infarction. Calvarium and skull base: No acute osseous abnormality identified. Paranasal sinuses: Visualized paranasal sinuses and mastoids are stable and well aerated. Orbits: No acute orbit or scalp soft tissue finding. CTA NECK Skeleton: No acute osseous abnormality identified. Upper chest:  Negative. Other neck: Chronic cervical lymphadenopathy, but progressed compared to April this year. Chronic heterogeneous right thyroid lobe appears stable since 2021 In the setting of significant comorbidities or limited life expectancy, no follow-up recommended (ref: J Am Coll Radiol. 2015 Feb;12(2): 143-50). Aortic arch: Aberrant origin right subclavian artery, normal variant. Calcified aortic atherosclerosis. Right carotid system: Mildly tortuous right CCA, no plaque or stenosis before the bifurcation. Right ICA origin and bulb soft and calcified plaque with less than 50 % stenosis with respect to the distal vessel. Left carotid system: Negative left CCA aside from tortuosity. Patent left carotid bifurcation with mild left ICA origin soft and calcified plaque. No stenosis. Vertebral arteries: Diminutive and poorly enhancing right vertebral artery in the V3 segment, stable. Aberrant origin right subclavian artery with mild plaque but no stenosis. Soft and calcified plaque of the proximal left subclavian artery with no significant stenosis. Plaque at the left vertebral artery origin with moderate to severe stenosis there (series 9, images 261 and 262). The left vertebral remains patent. But there is tandem moderate stenosis at the left V1/V2 junction (series 12, image 189). No other significant stenosis to the skull base. The left vertebral stenosis appears mildly progressed from the CTA last year. CTA HEAD Posterior circulation: Occluded distal right vertebral artery and distal left vertebral artery functionally terminates in PICA now, left V4 segment progressive stenosis and occlusion from the CTA last year. Reconstituted basilar artery in the region of the right AI Ca a, diminutive and irregular but patent to the basilar tip (series 15, image 28). The basilar does not appear significantly changed from the CTA last year. SCA and PCA origins remain patent, although the PCA enhancement appears diminished and more  irregular from the CTA last year. Only faintly enhancing bilateral SCA and PCA branches now. Anterior circulation: Both ICA siphons are patent although calcified. On the left there is moderate to severe cavernous segment stenosis related to soft and calcified plaque (series 13, image 120 and series 9 image 121. Moderate to severe left supraclinoid stenosis due to calcified plaque (series 9, image 116). Contralateral right ICA siphon calcification with only mild cavernous segment but moderate to severe supraclinoid stenosis (same image). Patent carotid termini. MCA and ACA origins remain patent. Posterior communicating arteries are diminutive or absent. Negative anterior communicating artery. Bilateral ACA branches are stable with mild irregularity. Chronic bilateral MCA branch irregularity. No left M1 stenosis. But moderate to severe proximal left M2 stenoses but stable from the CTA last year (series 16, image  28. Moderate to severe stenosis of the middle division left M3 branch appears progressed on that same image. Right MCA M1 segment and bifurcation remain patent. Anterior and posterior division tandem moderate to severe M2 branch stenoses are stable to mildly increased from last year on series 16, image 15. Venous sinuses: Limited evaluation due to early contrast timing. Anatomic variants: Aberrant origin proximal right subclavian artery. Review of the MIP images confirms the above findings IMPRESSION: 1. Stable non contrast CT appearance of the brain. Chronic small medullary and left cerebellar infarcts. 2. But worsening posterior circulation stenosis and enhancement compared to a 2023 CTA. - chronically occluded distal right vertebral artery. - distal left vertebral artery now functionally occluded, new from prior MRA/CTA. Underlying chronic left vertebral stenoses. - diminutive Basilar artery is reconstituted and remains patent. - however, attenuated Bilateral PCA and SCA enhancement compared to last year.  3. And chronic Moderate to Severe Anterior Circulation Atherosclerosis and Stenosis, also including hemodynamically significant stenosis of both ICAs, bilateral MCA M2 and M3 branches. 4. Progressive cervical lymphadenopathy since April this year compatible with progression of known Lymphoma. 5. Aortic Atherosclerosis (ICD10-I70.0). Aberrant origin right subclavian artery. Electronically Signed: By: Odessa Fleming M.D. On: 01/05/2023 08:44   CT Chest W Contrast  Result Date: 01/05/2023 CLINICAL DATA:  Pneumonia, chest pain. EXAM: CT CHEST WITH CONTRAST TECHNIQUE: Multidetector CT imaging of the chest was performed during intravenous contrast administration. RADIATION DOSE REDUCTION: This exam was performed according to the departmental dose-optimization program which includes automated exposure control, adjustment of the mA and/or kV according to patient size and/or use of iterative reconstruction technique. CONTRAST:  75mL OMNIPAQUE IOHEXOL 350 MG/ML SOLN COMPARISON:  October 04, 2022. FINDINGS: Cardiovascular: Atherosclerosis of thoracic aorta is noted without aneurysm or dissection. Normal cardiac size. No pericardial effusion. Aberrant right subclavian artery is again noted. Mild coronary artery calcifications are noted. Mediastinum/Nodes: Esophagus is unremarkable. Multiple small thyroid nodules are noted with the largest measuring 1 cm. There is again noted extensive bilateral axillary adenopathy with the largest lymph node measuring 4 cm on the right and 3.8 cm on the left. 2.1 cm subcarinal lymph node is noted. 9 mm anterior mediastinal lymph node is noted. 11 mm right hilar lymph node is noted. 11 mm left hilar lymph node is noted. Lungs/Pleura: No pneumothorax or pleural effusion is noted. Minimal bilateral posterior basilar subsegmental atelectasis is noted. Left lingular subsegmental atelectasis is noted. Enlarged lymph nodes are noted in the porta hepatis region with the largest measuring 2.1 cm.  Musculoskeletal: No chest wall abnormality. No acute or significant osseous findings. IMPRESSION: There is again noted extensive bilateral axillary adenopathy with the largest lymph node measuring 4 cm on the right and 3.8 cm on the left. This may be slightly enlarged compared to prior exam. Also noted is subcarinal, bilateral hilar, anterior mediastinal and porta hepatis adenopathy. These findings are highly concerning for lymphoma or metastatic disease. Multiple small thyroid nodules are noted with the largest measuring 1 cm. Not clinically significant; no follow-up imaging recommended. (Ref: J Am Coll Radiol. 2015 Feb;12(2): 143-50). Minimal bilateral posterior basilar subsegmental atelectasis. Mild left lingular subsegmental atelectasis. Mild coronary artery calcifications are noted. Aortic Atherosclerosis (ICD10-I70.0). Electronically Signed   By: Lupita Raider M.D.   On: 01/05/2023 08:40   DG Chest Portable 1 View  Result Date: 01/05/2023 CLINICAL DATA:  Chest pain. EXAM: PORTABLE CHEST 1 VIEW COMPARISON:  Portable chest 10/11/2022 FINDINGS: 7:12 a.m. There is a low inspiration on exam. There  is linear atelectasis in the hypoinflated lower lung fields. There is no convincing focal pneumonia. Rest of the lungs appear clear. No pleural effusion is seen. Mild cardiomegaly without CHF. The mediastinum is normally outlined. Calcification in the transverse aorta. No new osseous findings. IMPRESSION: Low inspiration with linear atelectasis in the hypoinflated lower lung fields. No convincing focal pneumonia. Mild cardiomegaly. Aortic atherosclerosis. Electronically Signed   By: Almira Bar M.D.   On: 01/05/2023 07:53    Pertinent labs & imaging results that were available during my care of the patient were reviewed by me and considered in my medical decision making (see MDM for details).  Medications Ordered in ED Medications  lidocaine (LIDODERM) 5 % 1 patch (1 patch Transdermal Patch Applied 01/05/23  1258)  prochlorperazine (COMPAZINE) injection 10 mg (10 mg Intravenous Given 01/05/23 0812)  diphenhydrAMINE (BENADRYL) injection 25 mg (25 mg Intravenous Given 01/05/23 0814)  lactated ringers bolus 1,000 mL (0 mLs Intravenous Stopped 01/05/23 0911)  iohexol (OMNIPAQUE) 350 MG/ML injection 75 mL (75 mLs Intravenous Contrast Given 01/05/23 0815)                                                                                                                                     Procedures Procedures  (including critical care time)  Medical Decision Making / ED Course   This patient presents to the ED for concern of headache, neck pain, chest pain, this involves an extensive number of treatment options, and is a complaint that carries with it a high risk of complications and morbidity.  The differential diagnosis includes CVA, worsening tumor burden, ACS, pneumonia, musculoskeletal pain, costochondritis  MDM: Patient seen emergency room for evaluation of multiple complaints as described above.  Physical exam with left upper and left lower extremity weakness consistent with her previous stroke.  No new neuro deficits today.  Laboratory evaluation with a hemoglobin of 11.3 but is otherwise unremarkable.  High-sensitivity troponin is unremarkable.  Chest x-ray with no acute abnormalities.  Given patient's extensive stroke history and headache with neck pain, angiography of the neck was performed that is concerning showing worsening posterior circulation with chronically occluded distal right vertebral artery but now distal left vertebral artery occlusion.  There is also progressive cervical lymphadenopathy likely secondary to patient's known Hodgkin's lymphoma.  CT chest also showing worsening lymphadenopathy and this is likely the source of the patient's chest and neck pain.  I spoke with the neurologist on-call Dr. Jerrell Belfast who spoke with the neurointerventional list and unfortunately there are no inpatient  options for revascularization available to the patient due to the vessel friability and high chance of dissection if they were to intervene.  Recommending outpatient follow-up with neurology.  I also spoke with the patient's oncologist Dr. Candise Che who states that the lymphadenopathy is not significantly progressed from previous imaging and she would be safe for follow-up in his office.  Headache improved with headache  cocktail.  I discussed these findings at length with the patient and multiple family members and at this time she does not meet inpatient criteria for admission.  Patient then discharged with outpatient follow-up.   Additional history obtained: -Additional history obtained from multiple family members -External records from outside source obtained and reviewed including: Chart review including previous notes, labs, imaging, consultation notes   Lab Tests: -I ordered, reviewed, and interpreted labs.   The pertinent results include:   Labs Reviewed  CBC WITH DIFFERENTIAL/PLATELET - Abnormal; Notable for the following components:      Result Value   Hemoglobin 11.3 (*)    HCT 34.5 (*)    RDW 18.9 (*)    Monocytes Absolute 1.2 (*)    All other components within normal limits  BASIC METABOLIC PANEL - Abnormal; Notable for the following components:   Glucose, Bld 172 (*)    All other components within normal limits  PATHOLOGIST SMEAR REVIEW  TROPONIN I (HIGH SENSITIVITY)  TROPONIN I (HIGH SENSITIVITY)      EKG   EKG Interpretation Date/Time:  Thursday January 05 2023 06:23:07 EDT Ventricular Rate:  95 PR Interval:  131 QRS Duration:  78 QT Interval:  349 QTC Calculation: 439 R Axis:   36  Text Interpretation: Sinus rhythm Low voltage, precordial leads Confirmed by Palumbo, April ([id number]) on 01/05/2023 6:31:55 AM         Imaging Studies ordered: I ordered imaging studies including CT angio brain and neck, CT chest I independently visualized and interpreted  imaging. I agree with the radiologist interpretation   Medicines ordered and prescription drug management: Meds ordered this encounter  Medications   prochlorperazine (COMPAZINE) injection 10 mg   diphenhydrAMINE (BENADRYL) injection 25 mg   lactated ringers bolus 1,000 mL   iohexol (OMNIPAQUE) 350 MG/ML injection 75 mL   lidocaine (LIDODERM) 5 % 1 patch   lidocaine (LIDODERM) 5 %    Sig: Place 1 patch onto the skin daily. Remove & Discard patch within 12 hours or as directed by MD    Dispense:  30 patch    Refill:  0    -I have reviewed the patients home medicines and have made adjustments as needed  Critical interventions none  Consultations Obtained: I requested consultation with the neurologist on-call Dr. Wilford Corner and patient's oncologist Dr. Candise Che,  and discussed lab and imaging findings as well as pertinent plan - they recommend: Outpatient follow-up   Cardiac Monitoring: The patient was maintained on a cardiac monitor.  I personally viewed and interpreted the cardiac monitored which showed an underlying rhythm of: NSR  Social Determinants of Health:  Factors impacting patients care include: Currently in a skilled nursing facility   Reevaluation: After the interventions noted above, I reevaluated the patient and found that they have :improved  Co morbidities that complicate the patient evaluation  Past Medical History:  Diagnosis Date   Abnormality of gait 05/10/2010   BACK PAIN 11/14/2008   Class 1 obesity due to excess calories with body mass index (BMI) of 31.0 to 31.9 in adult 02/07/2021   DIABETES MELLITUS, TYPE II 07/15/2008   Diplopia 07/15/2008   ECZEMA, ATOPIC 04/03/2009   GERD (gastroesophageal reflux disease)    Guillain-Barre (HCC) 1988   HYPERLIPIDEMIA 03/06/2009   HYPERTENSION 07/15/2008   Stroke (HCC) 2010, 2011   x2    Vertebral artery stenosis       Dispostion: I considered admission for this patient, but at this time she unfortunately does  not meet inpatient criteria for admission and was discharged with outpatient follow-up     Final Clinical Impression(s) / ED Diagnoses Final diagnoses:  Hodgkin lymphoma of lymph nodes of neck, unspecified Hodgkin lymphoma type (HCC)  Acute nonintractable headache, unspecified headache type  Occlusion of both vertebral arteries     @PCDICTATION @    Glendora Score, MD 01/05/23 1326

## 2023-01-24 ENCOUNTER — Encounter: Payer: Self-pay | Admitting: Physician Assistant

## 2023-02-08 ENCOUNTER — Ambulatory Visit: Payer: Commercial Managed Care - HMO | Admitting: Neurology

## 2023-02-14 ENCOUNTER — Encounter: Payer: Self-pay | Admitting: Neurology

## 2023-02-14 ENCOUNTER — Ambulatory Visit (INDEPENDENT_AMBULATORY_CARE_PROVIDER_SITE_OTHER): Payer: Managed Care, Other (non HMO) | Admitting: Neurology

## 2023-02-14 VITALS — BP 152/92 | HR 77 | Ht 64.0 in | Wt 152.0 lb

## 2023-02-14 DIAGNOSIS — I639 Cerebral infarction, unspecified: Secondary | ICD-10-CM | POA: Diagnosis not present

## 2023-02-14 DIAGNOSIS — I69398 Other sequelae of cerebral infarction: Secondary | ICD-10-CM | POA: Diagnosis not present

## 2023-02-14 DIAGNOSIS — I679 Cerebrovascular disease, unspecified: Secondary | ICD-10-CM

## 2023-02-14 DIAGNOSIS — I10 Essential (primary) hypertension: Secondary | ICD-10-CM

## 2023-02-14 DIAGNOSIS — R2689 Other abnormalities of gait and mobility: Secondary | ICD-10-CM

## 2023-02-14 DIAGNOSIS — R252 Cramp and spasm: Secondary | ICD-10-CM

## 2023-02-14 DIAGNOSIS — G5622 Lesion of ulnar nerve, left upper limb: Secondary | ICD-10-CM

## 2023-02-14 NOTE — Progress Notes (Signed)
Follow-up Visit   Date: 02/14/23   [name] MRN: [id number] DOB: 05-25-1960   Interim History: [name] is a 62 y.o. 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  She has history of prior stroke in 2010, pontine stroke (01/2021) and bilateral cerebellar stroke (08/2021).  The patient was accompanied to the clinic by self.   IMPRESSION/PLAN: Spasticity involving the left arm, neck, and speech as a late effect of stroke.   - Continue baclofen 10mg  twice daily  - Start PT/OT/Speech therapy at [facility]  2.  Cerebrovascular disease with severe intracranial stenosis and severe functional debility resulting severe weakness in the left arm, bilateral legs, dysphagia, and dysarthria. She is dependent on all ADLs, except feeding. She has history of multiple strokes (2010, pontine stroke in 2022, cerebellar stroke 2023, and bilateral medullary stroke and left cerebellar stroke).  She has been on a number of antiplatelet regiments (aspirin + plavix, plavix + pletal, and aspirin + Brilinta).  She has completed 3 months of dual antiplatelet therapy with aspirin + Brilanta, and on monotherapy with aspirin 81mg  daily.  I had a very frank discussion with patient that her risk of stroke remains high given the severity of her intracranial stenosis.  Unfortunately, none of which is amendable to revascularization.  Management remains medical therapy.  She is on aspirin 81mg . Cholesterol (LDL) and diabetes (HbA1c 6.7) is well-controlled.  Blood pressure is not at goal.  Appreciate PCP management of secondary risk factors.    Return to clinic in 6 months  -------------------------------------------------- UPDATE 10/04/2022:  She was hospitalized in January  for right medullary stroke presenting with gait change, diplopia, and dizziness.  She was discharged on plavix + pletal.  In February, she was readmitted with slurred speech, shortness of breath, and worsening right side weakness with imaging showing extension of the right medullary stroke to the left side.    She was switched to aspirin + Brilinta and recommended to continue this for 3 months.   Since her hospitalization, her diabetes is much better managed and HbA1c 11.4 > 6.7 and LDL is also improved from 409 > 81.  She has been as SNF and getting PT/OT.  She has noticed that her hand strength is starting to improve. She is antigravity in the right arm and had some movement in the left hand.  She was able to stand with assistance, which is improved.   She complains of odd sensation and tight sensation over the neck since her last stroke.   UPDATE 12/13/2022:  She reports having tight sensation in her throat and across her neck.  Her speech has become more strained and soft.  She cannot speak loudly.  She has better strength in the right arm and is able to hold onto objects.  She is able to raise the left arm, but has no movement in the hand and now fingers are clawing him.  She is nonambulatory and wheelchair-bound.  She was getting PT/OT but due to insurance limitations, she is no longer getting this.   UPDATE 02/14/2023:  She was in the ER in September with headache, chest pain, and neck pain.  MRI/A brain showed interval new small right cerebellar infarct, chronic medullary encephalomalcia, and loss of most posterior circulation flow with minimal flow in the basilar, SCA, and PCAs.  Additionally, there is moderate to severe anterior circulation stenosis. Active lymphoma was seen on imaging.  She is hoping for her strength to return in the left arm and legs.  She is well-chair dependent and nonambulatory.  Her right leg is moving less and she has minimal movement of the left arm and leg.  She continues to  have tight neck pain.    Medications:  Current Outpatient Medications on File Prior to Visit  Medication Sig Dispense Refill   acetaminophen (TYLENOL) 325 MG tablet Take 650 mg by mouth every 4 (four) hours as needed for mild pain. Do not exceed 3000mg  in 24 hours     alum & mag hydroxide-simeth (MAALOX PLUS) 400-400-40 MG/5ML suspension Take 30 mLs by mouth QID. prn     amLODipine (NORVASC) 10 MG tablet Take 1 tablet by mouth once daily 90 tablet 1   antiseptic oral rinse (BIOTENE) LIQD 15 mLs by Mouth Rinse route 3 (three) times daily.     aspirin EC 81 MG tablet Take 1 tablet (81 mg total) by mouth daily. Swallow whole. 30 tablet 12   atorvastatin (LIPITOR) 80 MG tablet Take 1 tablet (80 mg total) by mouth daily. 30 tablet 3   B Complex Vitamins (VITAMIN B COMPLEX) TABS Take 1 tablet by mouth every morning.     baclofen (LIORESAL) 10 MG tablet Take 5mg  twice daily x 1 week, then increase 10mg  twice daily.  Stay on lower dose if patient gets too sleepy. 60 each 5   cyanocobalamin 1000 MCG tablet Take 1,000 mcg by mouth daily.     Dulaglutide (TRULICITY) 0.75 MG/0.5ML SOPN Inject 0.75 mg into the skin once a week. 6 mL 3   ezetimibe (ZETIA) 10 MG tablet Take 1 tablet (10 mg total) by mouth daily. 30 tablet 3   famotidine (PEPCID) 20 MG tablet Take 20 mg by mouth daily.     FLUoxetine (PROZAC) 20 MG capsule Take 1 capsule (20 mg total) by mouth daily. (Patient taking differently: Take 40 mg by mouth daily.) 30 capsule 0   hydrocortisone cream 1 % Apply 1 Application topically 2 (two) times daily.     hydrOXYzine (ATARAX) 10 MG tablet Take 1 tablet (10 mg total) by mouth 3 (three) times daily as needed for anxiety. (Patient taking differently: Take 25 mg by mouth 3 (three) times daily as needed for anxiety.) 30 tablet 0   LANTUS SOLOSTAR 100 UNIT/ML Solostar Pen Inject 5 Units into the skin daily. 15 mL 11   lidocaine (LIDODERM) 5 % Place 1 patch onto the skin daily. Remove & Discard patch within  12 hours or as directed by MD 30 patch 0   mesalamine (CANASA) 1000 MG suppository Place 1 suppository (1,000 mg total) rectally 2 (two) times daily. 30 suppository 12   ONE TOUCH LANCETS MISC USE TO CHECK BLOOD SUGAR TWICE A DAY AND PRN 100 each 6   OXYGEN Inhale 2 L/min into the lungs as needed.     polyethylene glycol (MIRALAX / GLYCOLAX) 17 g packet Take 17 g by mouth every other day. 14 each 0   senna-docusate (SENOKOT-S) 8.6-50 MG tablet Take 1 tablet by mouth daily.     sulfamethoxazole-trimethoprim (BACTRIM DS) 800-160 MG tablet Take 1 tablet by mouth 2 (two) times daily.     traMADol HCl 25 MG TABS Take 50 mg by mouth every 6 (six) hours as needed.     pantoprazole (PROTONIX) 40 MG tablet Take 1 tablet (40 mg total) by  mouth daily. 30 tablet 0   No current facility-administered medications on file prior to visit.    Allergies: No Known Allergies  Vital Signs:  BP (!) 152/92   Pulse 77   Ht 5\' 4"  (1.626 m)   Wt 152 lb (68.9 kg)   SpO2 99%   BMI 26.09 kg/m   Neurological Exam: MENTAL STATUS including orientation to time, place, person, recent and remote memory, attention span and concentration, language, and fund of knowledge is normal.  Speech is midly dysarthric, soft, and moderately spastic.  CRANIAL NERVES:   Pupils equal round and reactive to light.  Normal conjugate, extra-ocular eye movements in all directions of gaze.  No ptosis .  Face is symmetric. Palate elevates symmetrically.  Tongue is midline.  MOTOR:  Moderate atrophy of the hands, worse on the left.  Generalized loss of muscle bulk in the legs, worse on the left.   RUE  5/5 proximally, 4/5 in the hand LUE  3/5 proximally, 2/5 in the hand RLE  2/5 proximally and distally LLE   1/5 throughout  MSRs:  Reflexes are 2+/4 throughout.  SENSORY:  Vibration and temperature intact throughout.  COORDINATION/GAIT:  Gait is not tested, patient in wheelchair  Data: NCS/EMG of the upper extremities 06/14/2018: The  electrophysiologic findings are consistent with a sensorimotor polyneuropathy, with axonal and demyelinating features.  Overall, these findings are moderate in degree electrically.  MRI head 01/05/2023:  1. Negative for acute infarct. 2. Expected evolution of medulflary encephalomalacia since February. New small but chronic right cerebellar infarcts since that time. Other brainstem and left cerebellar ischemia appears stable. 3. Progression of generalized cervical lymphadenopathy compatible with Active Lymphoma as on CTA today. 4. Loss of normal distal vertebral and proximal basilar artery flow voids concordant with CTA also. MRA today is reported separately.   MRA head 01/05/2023: 1. Abnormal Posterior Circulation concordant with CTA 0813 hours today: Loss of most posterior circulation flow signal in the setting of chronically occluded distal right vertebral, and interval occluded left vertebral artery V4 segments. Minimal flow detected now in the Basilar, SCAs and PCAs. The left PICA origin remains patent.   2. Stable MRI appearance of the Anterior Circulation since January, see anterior circulation atherosclerosis details on CTA this morning.   3. MRI today reported separately.  MRI brain wo contrast 06/12/2022: 1. Increased size of a recent right medullary infarct which now extends to the left of midline. 2. New punctate acute left cerebellar infarct. 3. Mild chronic small vessel ischemic disease. Chronic cerebellar infarcts.  MRI/A head and neck 05/30/2022: 1. Small acute infarct in the right aspect of the medulla without hemorrhage or mass effect. 2. Background chronic small-vessel ischemic change with small remote infarcts in the bilateral cerebellar hemispheres, one of which on the left is new since 09/15/2021. 3. Prominent lymph nodes in the upper neck consistent with the history of lymphoma, overall decreased in size since the prior studies from 09/15/2021 but  incompletely evaluated.   MRA HEAD   1. Diminutive enhancement of the right V4 segment with intermittent occlusion, and multifocal severe stenosis/near occlusion of the left V4 segment, similar to the prior CTA. 2. Additional intracranial atherosclerotic disease as detailed above resulting in moderate stenosis of the left cavernous ICA, mild-to-moderate stenosis of the right supraclinoid ICA, severe stenosis of the origin of the left superior M2 division, severe stenosis of the distal right M1 segment, and severe stenosis of the proximal left P1 segment.   MRA NECK  1. 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 2. Moderate stenosis of the origin of the left vertebral artery, not significantly changed. 3. Patent carotid systems bilaterally.    Lab Results  Component Value Date   CHOL 126 06/13/2022   HDL 28 (L) 06/13/2022   LDLCALC 81 06/13/2022   LDLDIRECT 175.9 03/02/2012   TRIG 84 06/13/2022   CHOLHDL 4.5 06/13/2022   Lab Results  Component Value Date   HGBA1C 6.7 (A) 09/06/2022   Total time spent reviewing records, interview, history/exam, documentation, and coordination of care on day of encounter:  30 min   Thank you for allowing me to participate in patient's care.  If I can answer any additional questions, I would be pleased to do so.    Sincerely,    Katara Griner K. Allena Katz, DO

## 2023-02-27 ENCOUNTER — Telehealth: Payer: Self-pay

## 2023-02-27 NOTE — Telephone Encounter (Signed)
I spoke with pt's sister, she will check and see if pt still needs Lantus. PAP came in and is in the fridge in [place] [facility].

## 2023-03-04 ENCOUNTER — Encounter (HOSPITAL_COMMUNITY): Payer: Self-pay

## 2023-03-04 ENCOUNTER — Emergency Department (HOSPITAL_COMMUNITY)
Admission: EM | Admit: 2023-03-04 | Discharge: 2023-03-05 | Disposition: A | Discharge status: Home / Self Care | Payer: Commercial Managed Care - HMO | Attending: Emergency Medicine | Admitting: Emergency Medicine

## 2023-03-04 ENCOUNTER — Other Ambulatory Visit: Payer: Self-pay

## 2023-03-04 ENCOUNTER — Emergency Department (HOSPITAL_COMMUNITY): Payer: Commercial Managed Care - HMO

## 2023-03-04 DIAGNOSIS — R600 Localized edema: Secondary | ICD-10-CM | POA: Insufficient documentation

## 2023-03-04 DIAGNOSIS — Z7982 Long term (current) use of aspirin: Secondary | ICD-10-CM | POA: Diagnosis not present

## 2023-03-04 DIAGNOSIS — Z794 Long term (current) use of insulin: Secondary | ICD-10-CM | POA: Insufficient documentation

## 2023-03-04 DIAGNOSIS — R0789 Other chest pain: Secondary | ICD-10-CM | POA: Insufficient documentation

## 2023-03-04 DIAGNOSIS — R079 Chest pain, unspecified: Secondary | ICD-10-CM | POA: Diagnosis present

## 2023-03-04 LAB — CBC
HCT: 34.2 % — ABNORMAL LOW (ref 36.0–46.0)
Hemoglobin: 11.1 g/dL — ABNORMAL LOW (ref 12.0–15.0)
MCH: 25.8 pg — ABNORMAL LOW (ref 26.0–34.0)
MCHC: 32.5 g/dL (ref 30.0–36.0)
MCV: 79.5 fL — ABNORMAL LOW (ref 80.0–100.0)
Platelets: 194 10*3/uL (ref 150–400)
RBC: 4.3 MIL/uL (ref 3.87–5.11)
RDW: 15.7 % — ABNORMAL HIGH (ref 11.5–15.5)
WBC: 6.8 10*3/uL (ref 4.0–10.5)
nRBC: 0 % (ref 0.0–0.2)

## 2023-03-04 MED ORDER — ONDANSETRON HCL 4 MG/2ML IJ SOLN
4.0000 mg | Freq: Once | INTRAMUSCULAR | Status: AC
  Administered 2023-03-04: 4 mg via INTRAVENOUS
  Filled 2023-03-04: qty 2

## 2023-03-04 MED ORDER — MORPHINE SULFATE (PF) 4 MG/ML IV SOLN
4.0000 mg | Freq: Once | INTRAVENOUS | Status: AC
  Administered 2023-03-04: 4 mg via INTRAVENOUS
  Filled 2023-03-04: qty 1

## 2023-03-04 NOTE — ED Triage Notes (Addendum)
Patient arrives via EMS from [facility]. Patient was complaining of shortness of breath. Nursing home reports her oxygen saturation was 88% on room air. EMS arrives patient remained at 100% on room air. In route patient stated she had chest heaviness that started with the shortness of breath but continued when the shortness of breath resolved.  Hx of stroke with left sided deficit and some speech delay.

## 2023-03-04 NOTE — ED Notes (Signed)
Pt transported to xray 

## 2023-03-04 NOTE — ED Provider Notes (Signed)
[facility] AT [facility] Provider Note   CSN: [id number] Arrival date & time: 03/04/23  2249     History  Chief Complaint  Patient presents with   Chest Pain   Shortness of Breath    [name] is a 62 y.o. female.  62 yo F with a chief complaint of chest pain.  This been going on for some time but she thought maybe was get a little bit worse over the past 24 to 48 hours.  Denies any trauma to the area.  Has been coughing when she tries to drink things.  She denies any fevers.  Denies history of MI.  Denies history of PE or DVT.  Nothing seems to make it better or worse.  Feels like a pressure across her chest.   Chest Pain Associated symptoms: shortness of breath   Shortness of Breath Associated symptoms: chest pain        Home Medications Prior to Admission medications   Medication Sig Start Date End Date Taking? Authorizing Provider  diclofenac Sodium (VOLTAREN) 1 % GEL Apply 4 g topically 4 (four) times daily. 03/05/23  Yes Melene Plan, DO  acetaminophen (TYLENOL) 325 MG tablet Take 650 mg by mouth every 4 (four) hours as needed for mild pain. Do not exceed 3000mg  in 24 hours    [provider]  alum & mag hydroxide-simeth (MAALOX PLUS) 400-400-40 MG/5ML suspension Take 30 mLs by mouth QID. prn    [provider]  amLODipine (NORVASC) 10 MG tablet Take 1 tablet by mouth once daily 05/09/22   [place], [name], MD  antiseptic oral rinse (BIOTENE) LIQD 15 mLs by Mouth Rinse route 3 (three) times daily.    [provider]  aspirin EC 81 MG tablet Take 1 tablet (81 mg total) by mouth daily. Swallow whole. 06/18/22   Osvaldo Shipper, MD  atorvastatin (LIPITOR) 80 MG tablet Take 1 tablet (80 mg total) by mouth daily. 05/09/22   [place], [name], MD  B Complex Vitamins (VITAMIN B COMPLEX) TABS Take 1 tablet by mouth every morning.    [provider]  baclofen (LIORESAL) 10 MG tablet Take 5mg  twice daily x 1 week, then  increase 10mg  twice daily.  Stay on lower dose if patient gets too sleepy. 12/13/22   Nita Sickle K, DO  cyanocobalamin 1000 MCG tablet Take 1,000 mcg by mouth daily.    [provider]  Dulaglutide (TRULICITY) 0.75 MG/0.5ML SOPN Inject 0.75 mg into the skin once a week. 04/13/22   Shamleffer, Konrad Dolores, MD  ezetimibe (ZETIA) 10 MG tablet Take 1 tablet (10 mg total) by mouth daily. 05/09/22   [place], [name], MD  famotidine (PEPCID) 20 MG tablet Take 20 mg by mouth daily.    [provider]  FLUoxetine (PROZAC) 20 MG capsule Take 1 capsule (20 mg total) by mouth daily. Patient taking differently: Take 40 mg by mouth daily. 09/27/22   Rhetta Mura, MD  hydrocortisone cream 1 % Apply 1 Application topically 2 (two) times daily.    [provider]  hydrOXYzine (ATARAX) 10 MG tablet Take 1 tablet (10 mg total) by mouth 3 (three) times daily as needed for anxiety. Patient taking differently: Take 25 mg by mouth 3 (three) times daily as needed for anxiety. 09/27/22   Rhetta Mura, MD  LANTUS SOLOSTAR 100 UNIT/ML Solostar Pen Inject 5 Units into the skin daily. 10/17/22   Hughie Closs, MD  lidocaine (LIDODERM) 5 % Place  1 patch onto the skin daily. Remove & Discard patch within 12 hours or as directed by MD 01/05/23   Kommor, Wyn Forster, MD  mesalamine (CANASA) 1000 MG suppository Place 1 suppository (1,000 mg total) rectally 2 (two) times daily. 10/17/22   Hughie Closs, MD  ONE TOUCH LANCETS MISC USE TO CHECK BLOOD SUGAR TWICE A DAY AND PRN 07/29/15   Gordy Savers, MD  OXYGEN Inhale 2 L/min into the lungs as needed.    [provider]  pantoprazole (PROTONIX) 40 MG tablet Take 1 tablet (40 mg total) by mouth daily. 10/17/22 12/13/22  Hughie Closs, MD  polyethylene glycol (MIRALAX / GLYCOLAX) 17 g packet Take 17 g by mouth every other day. 09/27/22   Rhetta Mura, MD  senna-docusate (SENOKOT-S) 8.6-50 MG tablet Take 1 tablet by mouth daily.     [provider]  sulfamethoxazole-trimethoprim (BACTRIM DS) 800-160 MG tablet Take 1 tablet by mouth 2 (two) times daily.    [provider]  traMADol HCl 25 MG TABS Take 50 mg by mouth every 6 (six) hours as needed. 11/16/22   [provider]      Allergies    Patient has no known allergies.    Review of Systems   Review of Systems  Respiratory:  Positive for shortness of breath.   Cardiovascular:  Positive for chest pain.    Physical Exam Updated Vital Signs BP (!) 150/77 (BP Location: Left Arm)   Pulse 71   Temp 98 F (36.7 C) (Oral)   Resp 20   SpO2 98%  Physical Exam Vitals and nursing note reviewed.  Constitutional:      General: She is not in acute distress.    Appearance: She is well-developed. She is not diaphoretic.  HENT:     Head: Normocephalic and atraumatic.  Eyes:     Pupils: Pupils are equal, round, and reactive to light.  Cardiovascular:     Rate and Rhythm: Normal rate and regular rhythm.     Heart sounds: No murmur heard.    No friction rub. No gallop.  Pulmonary:     Effort: Pulmonary effort is normal.     Breath sounds: No wheezing or rales.  Chest:     Chest wall: Tenderness present.     Comments: Pain with palpation across the center of the chest along the sternal border about ribs 4 through 6 reproduce her discomfort Abdominal:     General: There is no distension.     Palpations: Abdomen is soft.     Tenderness: There is no abdominal tenderness.  Musculoskeletal:        General: No tenderness.     Cervical back: Normal range of motion and neck supple.     Right lower leg: Edema present.     Left lower leg: Edema present.     Comments: Trace edema to bilateral lower extremities.  Muscle wasting bilaterally.  Skin:    General: Skin is warm and dry.  Neurological:     Mental Status: She is alert and oriented to person, place, and time.  Psychiatric:        Behavior: Behavior normal.     ED Results / Procedures  / Treatments   Labs (all labs ordered are listed, but only abnormal results are displayed) Labs Reviewed  BASIC METABOLIC PANEL - Abnormal; Notable for the following components:      Result Value   Glucose, Bld 151 (*)    BUN 25 (*)  All other components within normal limits  CBC - Abnormal; Notable for the following components:   Hemoglobin 11.1 (*)    HCT 34.2 (*)    MCV 79.5 (*)    MCH 25.8 (*)    RDW 15.7 (*)    All other components within normal limits  TROPONIN I (HIGH SENSITIVITY)  TROPONIN I (HIGH SENSITIVITY)    EKG EKG Interpretation Date/Time:  Saturday March 04 2023 22:53:06 EDT Ventricular Rate:  70 PR Interval:  143 QRS Duration:  86 QT Interval:  404 QTC Calculation: 436 R Axis:   6  Text Interpretation: Sinus rhythm Left ventricular hypertrophy No significant change since last tracing Confirmed by Melene Plan [phone]) on 03/04/2023 11:01:47 PM  Radiology DG Chest 2 View  Result Date: 03/04/2023 CLINICAL DATA:  Solitary chest pain, shortness of breath, hypoxia EXAM: CHEST - 2 VIEW COMPARISON:  01/05/2023 FINDINGS: Faint patchy right upper lobe opacity, raising concern for pneumonia. Platelike scarring the lingula. No pleural effusion or pneumothorax. The heart is normal in size. Degenerative changes of the visualized thoracolumbar spine. IMPRESSION: Faint patchy right upper lobe opacity, raising concern for pneumonia. Electronically Signed   By: Charline Bills M.D.   On: 03/04/2023 23:21    Procedures Procedures    Medications Ordered in ED Medications  morphine (PF) 4 MG/ML injection 4 mg (4 mg Intravenous Given 03/04/23 2328)  ondansetron (ZOFRAN) injection 4 mg (4 mg Intravenous Given 03/04/23 2327)    ED Course/ Medical Decision Making/ A&P                                 Medical Decision Making Amount and/or Complexity of Data Reviewed Labs: ordered. Radiology: ordered.  Risk Prescription drug management.   62 yo F with a cc of chest  pain. Atypical in nature and reproduced on exam.  Going on for quite some time.  She has a troponin is negative.  No significant change in her symptoms in 6 hours feel no delta is warranted.  Chest x-ray independently interpreted by me without focal infiltrate or pneumothorax.  Radiology read with possible right upper lobe infiltrate.  Does not fit the clinical picture.  I did discuss it with the patient and offered antibiotics she is declining at this time.  Will have her follow-up with her family doctor in the office.  2:14 AM:  I have discussed the diagnosis/risks/treatment options with the patient and family.  Evaluation and diagnostic testing in the emergency department does not suggest an emergent condition requiring admission or immediate intervention beyond what has been performed at this time.  They will follow up with PCP. We also discussed returning to the ED immediately if new or worsening sx occur. We discussed the sx which are most concerning (e.g., sudden worsening pain, fever, inability to tolerate by mouth) that necessitate immediate return. Medications administered to the patient during their visit and any new prescriptions provided to the patient are listed below.  Medications given during this visit Medications  morphine (PF) 4 MG/ML injection 4 mg (4 mg Intravenous Given 03/04/23 2328)  ondansetron (ZOFRAN) injection 4 mg (4 mg Intravenous Given 03/04/23 2327)     The patient appears reasonably screen and/or stabilized for discharge and I doubt any other medical condition or other [facility] requiring further screening, evaluation, or treatment in the ED at this time prior to discharge.          Final Clinical  Impression(s) / ED Diagnoses Final diagnoses:  Nonspecific chest pain    Rx / DC Orders ED Discharge Orders          Ordered    diclofenac Sodium (VOLTAREN) 1 % GEL  4 times daily        03/05/23 0056              Melene Plan, DO 03/05/23 0214

## 2023-03-05 ENCOUNTER — Telehealth: Payer: Self-pay

## 2023-03-05 LAB — BASIC METABOLIC PANEL
Anion gap: 8 (ref 5–15)
BUN: 25 mg/dL — ABNORMAL HIGH (ref 8–23)
CO2: 26 mmol/L (ref 22–32)
Calcium: 9.4 mg/dL (ref 8.9–10.3)
Chloride: 107 mmol/L (ref 98–111)
Creatinine, Ser: 0.9 mg/dL (ref 0.44–1.00)
GFR, Estimated: >60 mL/min (ref >60)
Glucose, Bld: 151 mg/dL — ABNORMAL HIGH (ref 70–99)
Potassium: 4.3 mmol/L (ref 3.5–5.1)
Sodium: 141 mmol/L (ref 135–145)

## 2023-03-05 LAB — TROPONIN I (HIGH SENSITIVITY)
Troponin I (High Sensitivity): 12 ng/L (ref <18)
Troponin I (High Sensitivity): 13 ng/L (ref <18)

## 2023-03-05 MED ORDER — DICLOFENAC SODIUM 1 % EX GEL: 4.0000 g | Freq: Four times a day (QID) | CUTANEOUS | 0 refills | Status: DC

## 2023-03-05 NOTE — ED Notes (Addendum)
1st call placed to [facility] [phone],XB make facility aware of PT's return.No answer.Will make 2 more attempts.

## 2023-03-05 NOTE — ED Notes (Signed)
[facility] CALLED, NO ETA

## 2023-03-05 NOTE — ED Notes (Signed)
2nd attempt made to alert [name] ([phone]) of PT's discharge. No answer. Will make 1 more attempt

## 2023-03-05 NOTE — Telephone Encounter (Signed)
[facility] pharmacy calling in stating they received the patients medication e script. She is not  a patient there. Called and left confidential message to call RNCM back to see where she would like the script

## 2023-03-05 NOTE — ED Provider Notes (Incomplete)
[facility]GENC[facility] DEPARTMENT AT [facility] Provider Note   CSN: [id number] Arrival date & time: 03/04/23  2249     History {Add pertinent medical, surgical, social history, OB history to HPI:1} Chief Complaint  Patient presents with  . Chest Pain  . Shortness of Breath    [name] is a 62 y.o. female.  62 yo F with a chief complaint of chest pain.  This been going on for some time but she thought maybe was get a little bit worse over the past 24 to 48 hours.  Denies any trauma to the area.  Has been coughing when she tries to drink things.  She denies any fevers.  Denies history of MI.  Denies history of PE or DVT.  Nothing seems to make it better or worse.  Feels like a pressure across her chest.   Chest Pain Associated symptoms: shortness of breath   Shortness of Breath Associated symptoms: chest pain        Home Medications Prior to Admission medications   Medication Sig Start Date End Date Taking? Authorizing Provider  acetaminophen (TYLENOL) 325 MG tablet Take 650 mg by mouth every 4 (four) hours as needed for mild pain. Do not exceed 3000mg  in 24 hours    [provider]  alum & mag hydroxide-simeth (MAALOX PLUS) 400-400-40 MG/5ML suspension Take 30 mLs by mouth QID. prn    [provider]  amLODipine (NORVASC) 10 MG tablet Take 1 tablet by mouth once daily 05/09/22   [place], [name], MD  antiseptic oral rinse (BIOTENE) LIQD 15 mLs by Mouth Rinse route 3 (three) times daily.    [provider]  aspirin EC 81 MG tablet Take 1 tablet (81 mg total) by mouth daily. Swallow whole. 06/18/22   Osvaldo Shipper, MD  atorvastatin (LIPITOR) 80 MG tablet Take 1 tablet (80 mg total) by mouth daily. 05/09/22   [place], [name], MD  B Complex Vitamins (VITAMIN B COMPLEX) TABS Take 1 tablet by mouth every morning.    [provider]  baclofen (LIORESAL) 10 MG tablet Take 5mg  twice daily x 1 week, then increase 10mg  twice daily.  Stay on  lower dose if patient gets too sleepy. 12/13/22   Nita Sickle K, DO  cyanocobalamin 1000 MCG tablet Take 1,000 mcg by mouth daily.    [provider]  Dulaglutide (TRULICITY) 0.75 MG/0.5ML SOPN Inject 0.75 mg into the skin once a week. 04/13/22   Shamleffer, Konrad Dolores, MD  ezetimibe (ZETIA) 10 MG tablet Take 1 tablet (10 mg total) by mouth daily. 05/09/22   [place], [name], MD  famotidine (PEPCID) 20 MG tablet Take 20 mg by mouth daily.    [provider]  FLUoxetine (PROZAC) 20 MG capsule Take 1 capsule (20 mg total) by mouth daily. Patient taking differently: Take 40 mg by mouth daily. 09/27/22   Rhetta Mura, MD  hydrocortisone cream 1 % Apply 1 Application topically 2 (two) times daily.    [provider]  hydrOXYzine (ATARAX) 10 MG tablet Take 1 tablet (10 mg total) by mouth 3 (three) times daily as needed for anxiety. Patient taking differently: Take 25 mg by mouth 3 (three) times daily as needed for anxiety. 09/27/22   Rhetta Mura, MD  LANTUS SOLOSTAR 100 UNIT/ML Solostar Pen Inject 5 Units into the skin daily. 10/17/22   Hughie Closs, MD  lidocaine (LIDODERM) 5 % Place 1 patch onto the skin daily. Remove & Discard patch within 12  hours or as directed by MD 01/05/23   Kommor, Wyn Forster, MD  mesalamine (CANASA) 1000 MG suppository Place 1 suppository (1,000 mg total) rectally 2 (two) times daily. 10/17/22   Hughie Closs, MD  ONE TOUCH LANCETS MISC USE TO CHECK BLOOD SUGAR TWICE A DAY AND PRN 07/29/15   Gordy Savers, MD  OXYGEN Inhale 2 L/min into the lungs as needed.    [provider]  pantoprazole (PROTONIX) 40 MG tablet Take 1 tablet (40 mg total) by mouth daily. 10/17/22 12/13/22  Hughie Closs, MD  polyethylene glycol (MIRALAX / GLYCOLAX) 17 g packet Take 17 g by mouth every other day. 09/27/22   Rhetta Mura, MD  senna-docusate (SENOKOT-S) 8.6-50 MG tablet Take 1 tablet by mouth daily.    [provider]   sulfamethoxazole-trimethoprim (BACTRIM DS) 800-160 MG tablet Take 1 tablet by mouth 2 (two) times daily.    [provider]  traMADol HCl 25 MG TABS Take 50 mg by mouth every 6 (six) hours as needed. 11/16/22   [provider]      Allergies    Patient has no known allergies.    Review of Systems   Review of Systems  Respiratory:  Positive for shortness of breath.   Cardiovascular:  Positive for chest pain.    Physical Exam Updated Vital Signs BP (!) 150/77 (BP Location: Left Arm)   Pulse 71   Temp 98 F (36.7 C) (Oral)   Resp 20   SpO2 98%  Physical Exam Vitals and nursing note reviewed.  Constitutional:      General: She is not in acute distress.    Appearance: She is well-developed. She is not diaphoretic.  HENT:     Head: Normocephalic and atraumatic.  Eyes:     Pupils: Pupils are equal, round, and reactive to light.  Cardiovascular:     Rate and Rhythm: Normal rate and regular rhythm.     Heart sounds: No murmur heard.    No friction rub. No gallop.  Pulmonary:     Effort: Pulmonary effort is normal.     Breath sounds: No wheezing or rales.  Chest:     Chest wall: Tenderness present.     Comments: Pain with palpation across the center of the chest along the sternal border about ribs 4 through 6 reproduce her discomfort Abdominal:     General: There is no distension.     Palpations: Abdomen is soft.     Tenderness: There is no abdominal tenderness.  Musculoskeletal:        General: No tenderness.     Cervical back: Normal range of motion and neck supple.     Right lower leg: Edema present.     Left lower leg: Edema present.     Comments: Trace edema to bilateral lower extremities.  Muscle wasting bilaterally.  Skin:    General: Skin is warm and dry.  Neurological:     Mental Status: She is alert and oriented to person, place, and time.  Psychiatric:        Behavior: Behavior normal.     ED Results / Procedures / Treatments    Labs (all labs ordered are listed, but only abnormal results are displayed) Labs Reviewed  BASIC METABOLIC PANEL  CBC  TROPONIN I (HIGH SENSITIVITY)    EKG EKG Interpretation Date/Time:  Saturday March 04 2023 22:53:06 EDT Ventricular Rate:  70 PR Interval:  143 QRS Duration:  86 QT Interval:  404 QTC Calculation:  436 R Axis:   6  Text Interpretation: Sinus rhythm Left ventricular hypertrophy No significant change since last tracing Confirmed by Melene Plan [phone]) on 03/04/2023 11:01:47 PM  Radiology No results found.  Procedures Procedures  {Document cardiac monitor, telemetry assessment procedure when appropriate:1}  Medications Ordered in ED Medications  morphine (PF) 4 MG/ML injection 4 mg (has no administration in time range)  ondansetron (ZOFRAN) injection 4 mg (has no administration in time range)    ED Course/ Medical Decision Making/ A&P   {   Click here for ABCD2, HEART and other calculatorsREFRESH Note before signing :1}                              Medical Decision Making Amount and/or Complexity of Data Reviewed Labs: ordered. Radiology: ordered.  Risk Prescription drug management.   62 yo F with a chief complaints of chest pain.  This is atypical in nature and reproduced on exam.  I think most likely this is musculoskeletal.  Been going on for greater than 6 hours without change.  Troponins negative.  CXR independently interpreted by me without focal infiltrate or ptx.    Radiology read with possible patchy right upper lobe infiltrates.  I discussed this with the patient and family.  She also clinically does not have pneumonia.  {Document critical care time when appropriate:1} {Document review of labs and clinical decision tools ie heart score, Chads2Vasc2 etc:1}  {Document your independent review of radiology images, and any outside records:1} {Document your discussion with family members, caretakers, and with consultants:1} {Document social  determinants of health affecting pt's care:1} {Document your decision making why or why not admission, treatments were needed:1} Final Clinical Impression(s) / ED Diagnoses Final diagnoses:  None    Rx / DC Orders ED Discharge Orders     None

## 2023-03-05 NOTE — Discharge Instructions (Signed)
Use the gel as prescribed.  You could also take 1000 mg of Tylenol 4 times a day.  Please open family doctor.  As we discussed the chest x-ray the radiologist was concerned that you may be have pneumonia.  I am not sure that that fits your clinical picture.  Please let someone know if you start having increased cough for difficulty breathing or develop a fever.

## 2023-03-05 NOTE — ED Notes (Signed)
Babette Relic, RN gave report to [employer]

## 2023-03-10 ENCOUNTER — Other Ambulatory Visit: Payer: Self-pay

## 2023-03-10 DIAGNOSIS — C8300 Small cell B-cell lymphoma, unspecified site: Secondary | ICD-10-CM

## 2023-03-13 ENCOUNTER — Inpatient Hospital Stay: Payer: Commercial Managed Care - HMO | Attending: Internal Medicine

## 2023-03-13 ENCOUNTER — Other Ambulatory Visit: Payer: Self-pay

## 2023-03-13 ENCOUNTER — Inpatient Hospital Stay (HOSPITAL_BASED_OUTPATIENT_CLINIC_OR_DEPARTMENT_OTHER): Payer: Commercial Managed Care - HMO | Admitting: Hematology

## 2023-03-13 VITALS — BP 152/83 | HR 75 | Temp 98.5°F | Resp 15

## 2023-03-13 DIAGNOSIS — C8304 Small cell B-cell lymphoma, lymph nodes of axilla and upper limb: Secondary | ICD-10-CM | POA: Diagnosis present

## 2023-03-13 DIAGNOSIS — C8300 Small cell B-cell lymphoma, unspecified site: Secondary | ICD-10-CM | POA: Diagnosis not present

## 2023-03-13 LAB — CMP (CANCER CENTER ONLY)
ALT: 19 U/L (ref 0–44)
AST: 22 U/L (ref 15–41)
Albumin: 3.1 g/dL — ABNORMAL LOW (ref 3.5–5.0)
Alkaline Phosphatase: 104 U/L (ref 38–126)
Anion gap: 4 — ABNORMAL LOW (ref 5–15)
BUN: 25 mg/dL — ABNORMAL HIGH (ref 8–23)
CO2: 26 mmol/L (ref 22–32)
Calcium: 9.5 mg/dL (ref 8.9–10.3)
Chloride: 109 mmol/L (ref 98–111)
Creatinine: 0.91 mg/dL (ref 0.44–1.00)
GFR, Estimated: >60 mL/min (ref >60)
Glucose, Bld: 181 mg/dL — ABNORMAL HIGH (ref 70–99)
Potassium: 4.2 mmol/L (ref 3.5–5.1)
Sodium: 139 mmol/L (ref 135–145)
Total Bilirubin: 0.3 mg/dL (ref <1.2)
Total Protein: 6.9 g/dL (ref 6.5–8.1)

## 2023-03-13 LAB — IRON AND IRON BINDING CAPACITY (CC-WL,HP ONLY)
Iron: 42 ug/dL (ref 28–170)
Saturation Ratios: 13 % (ref 10.4–31.8)
TIBC: 322 ug/dL (ref 250–450)
UIBC: 280 ug/dL (ref 148–442)

## 2023-03-13 LAB — CBC WITH DIFFERENTIAL (CANCER CENTER ONLY)
Abs Immature Granulocytes: 0.01 10*3/uL (ref 0.00–0.07)
Basophils Absolute: 0 10*3/uL (ref 0.0–0.1)
Basophils Relative: 0 %
Eosinophils Absolute: 0.4 10*3/uL (ref 0.0–0.5)
Eosinophils Relative: 6 %
HCT: 35.4 % — ABNORMAL LOW (ref 36.0–46.0)
Hemoglobin: 11.8 g/dL — ABNORMAL LOW (ref 12.0–15.0)
Immature Granulocytes: 0 %
Lymphocytes Relative: 54 %
Lymphs Abs: 3.8 10*3/uL (ref 0.7–4.0)
MCH: 26.7 pg (ref 26.0–34.0)
MCHC: 33.3 g/dL (ref 30.0–36.0)
MCV: 80.1 fL (ref 80.0–100.0)
Monocytes Absolute: 0.7 10*3/uL (ref 0.1–1.0)
Monocytes Relative: 10 %
Neutro Abs: 2.1 10*3/uL (ref 1.7–7.7)
Neutrophils Relative %: 30 %
Platelet Count: 202 10*3/uL (ref 150–400)
RBC: 4.42 MIL/uL (ref 3.87–5.11)
RDW: 15 % (ref 11.5–15.5)
Smear Review: NORMAL
WBC Count: 7.2 10*3/uL (ref 4.0–10.5)
WBC Morphology: ABNORMAL
nRBC: 0 % (ref 0.0–0.2)

## 2023-03-13 LAB — LACTATE DEHYDROGENASE: LDH: 228 U/L — ABNORMAL HIGH (ref 98–192)

## 2023-03-13 LAB — FERRITIN: Ferritin: 150 ng/mL (ref 11–307)

## 2023-03-13 NOTE — Progress Notes (Signed)
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 The pt reports that she is doing well overall.   The pt reports that her axillary lymphadenopathy was picked up during a routing Mammogram on 05/31/2019. Pt denies feeling any lumps or bumps in either axillary regions. Pt has felt the same for the last 6-12 months.   She has HTN and Diabetes. Her HTN is currently well-controlled but her Diabetes has been harder to control. Pt notes that "sometimes it's up and sometimes it's down". 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 She feels that the reaction on her left side is a little slower than her right side, but nothing that's noticeable. She denies any weakness, vision changes, or other residual symptoms from either stroke. 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 Pt made a full recovery and has no remaining symptoms. Her eczema is seasonal and not very bothersome. She denies any concerns for frequent infections. Pt has neuropathy in her hands/fingers and has been prescribed Gabapentin, which is helping.   Pt does not drink much alcohol and has never been a smoker. Her father passed from Lung Cancer and had a history of smoking. Her mother passed from a stroke last spring. 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 She is living alone and has one sister in the area.   Pt has had her first dose of the  COVID19 vaccine and tolerated it well. She has the second dose scheduled.   Of note prior to the patient's visit today, pt has had Flow Pathology ([id number]) completed on 06/26/2019 with results revealing "Monoclonal B-cell population identified."  Pt has had Right Axilla LN Bx ([id number]) completed on 06/26/2019 with results revealing "NON-HODGKIN B-CELL LYMPHOMA"   Pt has also had Mammogram [id number]) completed on 05/31/2019 with results revealing "Further evaluation is suggested for prominent lymph nodes in the bilateral axilla."  Most recent lab results (03/13/2019) of CBC and CMP is as follows: all values are WNL except for RBC at 5.61, MCV at 78.8, MCH at 25.8, Sodium at 131, Glucose at 502, BUN at 24.  On review of systems, pt reports hard stools, tingling/numbness in fingers/hands and denies weakness, vision changes, fevers, chills, night sweats, SOB, fatigue, constipation, diarrhea and any other symptoms.   On PMHx the pt reports Guillain-Barr syndrome, Type II Diabetes, Atopic Eczema, HLD, HTN, Stroke x2, Neuropathy. On Social Hx the pt reports that she does not drink much alcohol and is a non-smoker.  INTERVAL HISTORY:  [name] is a wonderful 62 y.o. female who is here for continued evaluation and management of her small lymphocytic lymphoma.  Patient was last seen by me on 09/12/2022 and she complained of enlarged lymph nodes near her left armpit, left neck pain, pain with bowel movement, occasional constipation, and occasional dizziness.   Patient has been hospitalized many times since our last visit. Her most recent hospitalization was on 03/04/2023 due to chest pain. She  had Chest X-Ray at the hospital, which showed concerns for pneumonia. She was prescribed antibiotics.   Patient is accompanied by her family member during this visit. Patient complains of neck pain/numbness, enlarged lymph nodes near her right and left armpit, and bilateral hand  neuropathy/swelling. Patient notes that she has been having difficulty eating food due to neck pain.   Patient does complain of occasional SOB, but notes that her SpO2 at her nursing facility is around 90%.   She denies any new infection issues, fever, unexpected weight loss, back pain, leg pain, abnormal bleeding issues, abnormal bowel movement, abdominal pain, or chest pain. She does complain of occasional right leg swelling, and occasional chills/night sweats, and left hand swelling.   Patient notes she occasionally has "jittery" feeling throughout her body, which improves on its own. Patient notes she has been to her Neurologist regarding "jittery" feeling. Neurologist did not find any abnormalities.   She notes she has been eating well and has been staying well hydrated.   Patient had colonoscopy in March 2024, which showed several polyps and ulcers. She has been following up with her Gastroenterologist. She denies bleeding in stool, black stool, nor abdominal pain during this visit.   During this visit, patient notes she has a small knot around her left neck. Upon physical exam, patient does have enlarged lymph node in her right and left armpit.   MEDICAL HISTORY:  Past Medical History:  Diagnosis Date   Abnormality of gait 05/10/2010   BACK PAIN 11/14/2008   Class 1 obesity due to excess calories with body mass index (BMI) of 31.0 to 31.9 in adult 02/07/2021   DIABETES MELLITUS, TYPE II 07/15/2008   Diplopia 07/15/2008   ECZEMA, ATOPIC 04/03/2009   GERD (gastroesophageal reflux disease)    Guillain-Barre (HCC) 1988   HYPERLIPIDEMIA 03/06/2009   HYPERTENSION 07/15/2008   Stroke (HCC) 2010, 2011   x2    Vertebral artery stenosis     SURGICAL HISTORY: Past Surgical History:  Procedure Laterality Date   ABDOMINAL HYSTERECTOMY     BIOPSY  07/08/2022   Procedure: BIOPSY;  Surgeon: Benancio Deeds, MD;  Location: [facility] ENDOSCOPY;  Service: Gastroenterology;;   BIOPSY  10/12/2022    Procedure: BIOPSY;  Surgeon: Shellia Cleverly, DO;  Location: [facility] ENDOSCOPY;  Service: Gastroenterology;;   COLONOSCOPY WITH PROPOFOL N/A 07/08/2022   Procedure: COLONOSCOPY WITH PROPOFOL;  Surgeon: Benancio Deeds, MD;  Location: [facility] ENDOSCOPY;  Service: Gastroenterology;  Laterality: N/A;   DILATION AND CURETTAGE OF UTERUS     FLEXIBLE SIGMOIDOSCOPY N/A 10/12/2022   Procedure: FLEXIBLE SIGMOIDOSCOPY;  Surgeon: Shellia Cleverly, DO;  Location: [facility] ENDOSCOPY;  Service: Gastroenterology;  Laterality: N/A;   FOOT SURGERY     IR ANGIO INTRA EXTRACRAN SEL COM CAROTID INNOMINATE UNI L MOD SED  09/17/2021   IR ANGIO INTRA EXTRACRAN SEL INTERNAL CAROTID UNI R MOD SED  09/17/2021   IR ANGIO VERTEBRAL SEL VERTEBRAL UNI R MOD SED  09/17/2021   IR US GUIDE VASC ACCESS RIGHT  09/17/2021    SOCIAL HISTORY: Social History   Socioeconomic History   Marital status: Divorced    Spouse name: Not on file   Number of children: 0   Years of education: 13   Highest education level: Not on file  Occupational History   Occupation: custodian at [employer]  Tobacco Use   Smoking status: Never   Smokeless tobacco: Never  Vaping Use   Vaping status: Never Used  Substance  and Sexual Activity   Alcohol use: No   Drug use: No   Sexual activity: Not Currently  Other Topics Concern   Not on file  Social History Narrative   Right Handed    Lives in a one story home    No caffeine   Social Determinants of Health   Financial Resource Strain: Not on file  Food Insecurity: No Food Insecurity (09/24/2022)   Hunger Vital Sign    Worried About Running Out of Food in the Last Year: Never true    Ran Out of Food in the Last Year: Never true  Transportation Needs: No Transportation Needs (09/24/2022)   PRAPARE - Administrator, Civil Service (Medical): No    Lack of Transportation (Non-Medical): No  Physical Activity: Not on file  Stress: Not on file  Social Connections: Not on file   Intimate Partner Violence: Not At Risk (09/24/2022)   Humiliation, Afraid, Rape, and Kick questionnaire    Fear of Current or Ex-Partner: No    Emotionally Abused: No    Physically Abused: No    Sexually Abused: No    FAMILY HISTORY: Family History  Problem Relation Age of Onset   Diabetes Sister    Asthma Other    Stroke Other    Hypertension Other    Diabetes Mother    Stroke Mother    Cancer Father        pt states hae had some kind of stomach cancer, ? stomach or colon   Breast cancer Neg Hx     ALLERGIES:  has No Known Allergies.  MEDICATIONS:  Current Outpatient Medications  Medication Sig Dispense Refill   acetaminophen (TYLENOL) 325 MG tablet Take 650 mg by mouth every 4 (four) hours as needed for mild pain. Do not exceed 3000mg  in 24 hours     alum & mag hydroxide-simeth (MAALOX PLUS) 400-400-40 MG/5ML suspension Take 30 mLs by mouth QID. prn     amLODipine (NORVASC) 10 MG tablet Take 1 tablet by mouth once daily 90 tablet 1   antiseptic oral rinse (BIOTENE) LIQD 15 mLs by Mouth Rinse route 3 (three) times daily.     aspirin EC 81 MG tablet Take 1 tablet (81 mg total) by mouth daily. Swallow whole. 30 tablet 12   atorvastatin (LIPITOR) 80 MG tablet Take 1 tablet (80 mg total) by mouth daily. 30 tablet 3   B Complex Vitamins (VITAMIN B COMPLEX) TABS Take 1 tablet by mouth every morning.     baclofen (LIORESAL) 10 MG tablet Take 5mg  twice daily x 1 week, then increase 10mg  twice daily.  Stay on lower dose if patient gets too sleepy. 60 each 5   cyanocobalamin 1000 MCG tablet Take 1,000 mcg by mouth daily.     diclofenac Sodium (VOLTAREN) 1 % GEL Apply 4 g topically 4 (four) times daily. 100 g 0   Dulaglutide (TRULICITY) 0.75 MG/0.5ML SOPN Inject 0.75 mg into the skin once a week. 6 mL 3   ezetimibe (ZETIA) 10 MG tablet Take 1 tablet (10 mg total) by mouth daily. 30 tablet 3   famotidine (PEPCID) 20 MG tablet Take 20 mg by mouth daily.     FLUoxetine (PROZAC) 20 MG  capsule Take 1 capsule (20 mg total) by mouth daily. (Patient taking differently: Take 40 mg by mouth daily.) 30 capsule 0   hydrocortisone cream 1 % Apply 1 Application topically 2 (two) times daily.     hydrOXYzine (ATARAX) 10 MG  tablet Take 1 tablet (10 mg total) by mouth 3 (three) times daily as needed for anxiety. (Patient taking differently: Take 25 mg by mouth 3 (three) times daily as needed for anxiety.) 30 tablet 0   LANTUS SOLOSTAR 100 UNIT/ML Solostar Pen Inject 5 Units into the skin daily. 15 mL 11   lidocaine (LIDODERM) 5 % Place 1 patch onto the skin daily. Remove & Discard patch within 12 hours or as directed by MD 30 patch 0   mesalamine (CANASA) 1000 MG suppository Place 1 suppository (1,000 mg total) rectally 2 (two) times daily. 30 suppository 12   ONE TOUCH LANCETS MISC USE TO CHECK BLOOD SUGAR TWICE A DAY AND PRN 100 each 6   OXYGEN Inhale 2 L/min into the lungs as needed.     pantoprazole (PROTONIX) 40 MG tablet Take 1 tablet (40 mg total) by mouth daily. 30 tablet 0   polyethylene glycol (MIRALAX / GLYCOLAX) 17 g packet Take 17 g by mouth every other day. 14 each 0   senna-docusate (SENOKOT-S) 8.6-50 MG tablet Take 1 tablet by mouth daily.     sulfamethoxazole-trimethoprim (BACTRIM DS) 800-160 MG tablet Take 1 tablet by mouth 2 (two) times daily.     traMADol HCl 25 MG TABS Take 50 mg by mouth every 6 (six) hours as needed.     No current facility-administered medications for this visit.    REVIEW OF SYSTEMS:   10 Point review of Systems was done is negative except as noted above.  PHYSICAL EXAMINATION: ECOG PERFORMANCE STATUS: 0 - Asymptomatic  . Vitals:   03/13/23 1030  BP: (!) 152/83  Pulse: 75  Resp: 15  Temp: 98.5 F (36.9 C)  SpO2: 96%  . GENERAL:alert, in no acute distress and comfortable SKIN: no acute rashes, no significant lesions EYES: conjunctiva are pink and non-injected, sclera anicteric OROPHARYNX: MMM, no exudates, no oropharyngeal erythema  or ulceration NECK: supple, no JVD LYMPH: small cervical and axillary LN,  no palpable lymphadenopathy in the inguinal regions LUNGS: clear to auscultation b/l with normal respiratory effort HEART: regular rate & rhythm ABDOMEN:  normoactive bowel sounds , non tender, not distended. Extremity: no pedal edema   LABORATORY DATA:  I have reviewed the data as listed  .    Latest Ref Rng & Units 03/13/2023    9:32 AM 03/04/2023   11:23 PM 01/05/2023    6:42 AM  CBC  WBC 4.0 - 10.5 K/uL 7.2  6.8  8.6   Hemoglobin 12.0 - 15.0 g/dL 16.1  09.6  04.5   Hematocrit 36.0 - 46.0 % 35.4  34.2  34.5   Platelets 150 - 400 K/uL 202  194  245     .    Latest Ref Rng & Units 03/13/2023    9:32 AM 03/04/2023   11:23 PM 01/05/2023    6:42 AM  CMP  Glucose 70 - 99 mg/dL 409  811  914   BUN 8 - 23 mg/dL 25  25  12    Creatinine 0.44 - 1.00 mg/dL 7.82  9.56  2.13   Sodium 135 - 145 mmol/L 139  141  139   Potassium 3.5 - 5.1 mmol/L 4.2  4.3  4.1   Chloride 98 - 111 mmol/L 109  107  107   CO2 22 - 32 mmol/L 26  26  26    Calcium 8.9 - 10.3 mg/dL 9.5  9.4  9.1   Total Protein 6.5 - 8.1 g/dL 6.9  Total Bilirubin <1.2 mg/dL 0.3     Alkaline Phos 38 - 126 U/L 104     AST 15 - 41 U/L 22     ALT 0 - 44 U/L 19      . Lab Results  Component Value Date   LDH 228 (H) 03/13/2023   Alpha-Thalassemia Comment:  Comment: (NOTE) Test: Alpha-Thalassemia, DNA Analysis Result:     Alpha-+-thalassemia trait, alpha alpha/alpha- Mutation(s) identified: alpha3.7    07/16/2019 FISH-CLL Prognostic Panel:         RADIOGRAPHIC STUDIES: I have personally reviewed the radiological images as listed and agreed with the findings in the report. DG Chest 2 View  Result Date: 03/04/2023 CLINICAL DATA:  Solitary chest pain, shortness of breath, hypoxia EXAM: CHEST - 2 VIEW COMPARISON:  01/05/2023 FINDINGS: Faint patchy right upper lobe opacity, raising concern for pneumonia. Platelike scarring the lingula. No  pleural effusion or pneumothorax. The heart is normal in size. Degenerative changes of the visualized thoracolumbar spine. IMPRESSION: Faint patchy right upper lobe opacity, raising concern for pneumonia. Electronically Signed   By: Charline Bills M.D.   On: 03/04/2023 23:21    ASSESSMENT & PLAN:    62 yo with   1) Small lymphocytic lymphoma-at least stage IIIA-trisomy 12 mutation Diagnosed incidentally as LNadenopathy noted on routine screening mamogram. No consitutional symptoms.  05/31/2019 Mammogram ([id number]) revealed "Further evaluation is suggested for prominent lymph nodes in the bilateral axilla." 06/26/2019 Flow Pathology ([id number]) revealed "Monoclonal B-cell population identified." 06/26/2019 Right Axilla LN Bx ([id number]) revealed "NON-HODGKIN B-CELL LYMPHOMA"  07/16/2019 FISH/CLL Prognostic Panel ([id number]) revealed "Trisomy 12 (+12) is present."  10/15/2019 CT Soft Tissue Neck ([id number]) revealed bilateral cervical lymphadenopathy. 10/15/2019 CT C/A/P ([id number]) ([id number]) revealed several small lymph nodes in axillary, retroperitoneal, and pelvic regions. Incidentally noted complex renal cyst.   2) RBC microcytosis without Anemia and normal RDW and relative erythrocytosis -- due to alpha thal trait Alpha-Thalassemia Comment:  Comment: (NOTE) Test: Alpha-Thalassemia, DNA Analysis Result:     Alpha-+-thalassemia trait, alpha alpha/alpha- Mutation(s) identified: alpha3.7    3) nonmelanoma skin cancer on lower extremities. -Continue follow-up with dermatology for evaluation and management. -I discussed with the patient that her non-Hodgkin's lymphoma/SLL could be a risk factor for recurrent nonmelanoma skin cancers.  4) . Patient Active Problem List   Diagnosis Date Noted   Protein-calorie malnutrition, severe 10/08/2022   Severe sepsis (HCC) 10/05/2022   Dehydration 10/05/2022   Acute prerenal azotemia 10/05/2022   Proctitis 10/05/2022    Hypercalcemia 10/05/2022   Transaminitis 10/05/2022   Pyuria 10/05/2022   Depression 10/05/2022   CAP (community acquired pneumonia) 10/04/2022   Malnutrition of moderate degree 09/26/2022   SOB (shortness of breath) 09/25/2022   Nausea vomiting and diarrhea 09/24/2022   Shortness of breath 09/24/2022   Acute respiratory failure with hypoxia (HCC) 09/24/2022   LFT elevation 09/24/2022   Diabetes mellitus (HCC) 09/06/2022   Rectal ulcer 07/09/2022   Colon ulcer 07/08/2022   Rectal bleeding 07/07/2022   Anemia 07/07/2022   GI bleed 07/06/2022   Dysarthria 06/12/2022   Aspiration pneumonia (HCC) 06/12/2022   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   Anxiety disorder due to medical condition 06/10/2022   AKI (acute kidney injury) (HCC) 06/01/2022   Dizziness 05/29/2022   Personal history of CLL (chronic lymphocytic leukemia) 05/29/2022   Rectal pain 05/29/2022   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 disease, with long-term current use of insulin (HCC) 04/14/2022   Type 2 diabetes mellitus with  diabetic polyneuropathy, with long-term current use of insulin (HCC) 04/14/2022   Antiplatelet or antithrombotic long-term use 03/16/2022   HLD (hyperlipidemia) 10/01/2021   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HCC) 09/17/2021   Stage 3a chronic kidney disease (CKD) (HCC) 09/16/2021   Thyroid nodule 09/16/2021   Cervical lymphadenopathy 09/16/2021   Acute CVA (cerebrovascular accident) (HCC) 09/15/2021   GERD (gastroesophageal reflux disease) 04/30/2021   Peripheral arterial disease (HCC) 04/13/2021   Atherosclerosis of aorta (HCC) 02/14/2021   Cerebral thrombosis with cerebral infarction 02/08/2021   Abnormal MRI of head 02/07/2021   Vertigo 02/07/2021   Class 1 obesity due to excess calories with body mass index (BMI) of 31.0 to 31.9 in adult 02/07/2021   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   Trigger finger, left ring finger 10/04/2018   Alternating constipation and  diarrhea 04/20/2018   Diabetic peripheral neuropathy associated with type 2 diabetes mellitus (HCC) 02/26/2018   DM2 (diabetes mellitus, type 2) (HCC) 07/29/2015   ABNORMALITY OF GAIT 05/10/2010   ECZEMA, ATOPIC 04/03/2009   Hyperlipidemia associated with type 2 diabetes mellitus (HCC) 03/06/2009   BACK PAIN 11/14/2008   Hemiparesis affecting left side as late effect of stroke (HCC) 08/05/2008   DIPLOPIA 07/15/2008   Essential hypertension 07/15/2008   PLAN: -Discussed lab results from today, 03/13/2023, in detail with the patient. CBC shows patient is slightly anemic with hemoglobin of 11.8 g/dL and hematocrit of 16.1%. CMP shows slightly elevated BUN of 25 and elevated glucose level of 181 mg/dL.  -Discussed with the patient that facial numbness does not seem to be from Lymphoma. Discussed with the patient that left hand weakness and swelling is due to her previous medical history of a stroke.  -Discussed with the patient Lymphoma treatment will not help her post-stroke symptoms. Discussed with the patient that we will start Lymphoma treatment, most likely low-dose of Venetoclax treatment, if she has increased enlarged lymph nodes which have been painful. Discussed the risk factors of lymphoma treatment.  -Discussed the option of treatment or continue with observation.  -Patient wants to start lymphoma treatment.  -Discussed with the patient that she will need PET scan before starting Venetoclax treatment.  -Schedule patient for PET scan in 1 week.  PET/CT in 1 week Starting venetoclax in 2-3 weeks  Will need weekly labs x 4 after starting venetoclax RTC with Dr Getsemani Lindon in 4 weeks with labs  FOLLOW-UP: PET/CT in 1 week Starting venetoclax in 2-3 weeks  Will need weekly labs x 4 after starting venetoclax RTC with Dr Beverlee Wilmarth in 4 weeks with labs  The total time spent in the appointment was 45 minutes* .  All of the patient's questions were answered with apparent satisfaction. The patient  knows to call the clinic with any problems, questions or concerns.   Wyvonnia Lora MD MS AAHIVMS [facility] Hematology/Oncology Physician [facility]  .*Total Encounter Time as defined by the Centers for Medicare and Medicaid Services includes, in addition to the face-to-face time of a patient visit (documented in the note above) non-face-to-face time: obtaining and reviewing outside history, ordering and reviewing medications, tests or procedures, care coordination (communications with other health care professionals or caregivers) and documentation in the medical record.   I,Param Shah,acting as a Neurosurgeon for Wyvonnia Lora, MD.,have documented all relevant documentation on the behalf of Wyvonnia Lora, MD,as directed by  Wyvonnia Lora, MD while in the presence of Wyvonnia Lora, MD.   .I have reviewed the above documentation for accuracy and completeness,  and I agree with the above. Johney Maine MD

## 2023-03-16 ENCOUNTER — Telehealth: Payer: Self-pay | Admitting: Hematology

## 2023-03-16 NOTE — Telephone Encounter (Signed)
Left patient a message in regards to scheduled appointment times/dates; left callback if needed for rescheduling

## 2023-03-19 MED ORDER — VENETOCLAX 10 & 50 & 100 MG PO TBPK: ORAL_TABLET | ORAL | 0 refills | Status: DC

## 2023-03-19 MED ORDER — ONDANSETRON HCL 8 MG PO TABS
8.0000 mg | ORAL_TABLET | Freq: Three times a day (TID) | ORAL | 0 refills | Status: DC | PRN
  Filled 2023-03-19: qty 30, 10d supply, fill #0

## 2023-03-19 MED ORDER — ALLOPURINOL 100 MG PO TABS
100.0000 mg | ORAL_TABLET | Freq: Two times a day (BID) | ORAL | 1 refills | Status: DC
  Filled 2023-03-19: qty 60, 30d supply, fill #0

## 2023-03-20 ENCOUNTER — Other Ambulatory Visit (HOSPITAL_COMMUNITY): Payer: Self-pay

## 2023-03-20 ENCOUNTER — Telehealth: Payer: Self-pay | Admitting: Pharmacist

## 2023-03-20 ENCOUNTER — Telehealth: Payer: Self-pay | Admitting: Pharmacy Technician

## 2023-03-20 NOTE — Telephone Encounter (Addendum)
Oral Oncology Patient Advocate Encounter   Received notification that prior authorization for Venclexta is required.   PA submitted on 03/20/23 PA submitted via phone [phone] Case ID [id number] Status is pending     Jinger Neighbors, CPhT-Adv Oncology Pharmacy Patient Advocate [facility] Direct Number: [phone]  Fax: [phone]

## 2023-03-20 NOTE — Telephone Encounter (Signed)
Oral Oncology Pharmacist Encounter  Received new prescription for Venclexta (venetoclax) for the treatment of SLL, planned duration until disease progression or unacceptable drug toxicity.  CBC w/ Diff and CMP from 03/13/23 assessed, no relevant lab abnormalities requiring baseline dose adjustment required at this time.  Current medication list in Epic reviewed, DDIs with Venclexta identified: Category C drug-drug interaction between Mesalamine and Venetoclax - Mesalamine may increase myelosuppressive effects of venetoclax - no change in therapy required at this time, monitor patient's CBC w/ Diff while on concomitant agents.  Category C drug-drug interaction between Venetoclax and Trulicity and Lantus SoloStar - venetoclax can cause hyperglycemia, thus may decrease efficacy of antidiabetic agents. Recommend monitoring patient's glucose while on therapy and having PCP adjust antihyperglycemic agents as indicated. No changes in therapy warranted at this time.   Evaluated chart and no patient barriers to medication adherence noted.   Prescription has been e-scribed to the [facility] for benefits analysis and approval.  Oral Oncology Clinic will continue to follow for insurance authorization, copayment issues, initial counseling and start date.  Lenord Carbo, PharmD, BCPS, [facility] Hematology/Oncology Clinical Pharmacist [facility] Oral Chemotherapy Navigation Clinics [phone] 03/20/2023 8:51 AM

## 2023-03-20 NOTE — Telephone Encounter (Signed)
Oral Oncology Patient Advocate Encounter  Prior Authorization for [name] has been approved.    PA# [id number] Effective dates: 03/20/23 through 03/19/24  Patients co-pay is $0.    Jinger Neighbors, CPhT-Adv Oncology Pharmacy Patient Advocate [facility] Direct Number: [phone]  Fax: [phone]

## 2023-03-28 ENCOUNTER — Encounter (HOSPITAL_COMMUNITY): Payer: Self-pay | Admitting: Emergency Medicine

## 2023-03-28 ENCOUNTER — Emergency Department (HOSPITAL_COMMUNITY)
Admission: EM | Admit: 2023-03-28 | Discharge: 2023-03-28 | Disposition: A | Discharge status: Home / Self Care | Payer: Commercial Managed Care - HMO | Attending: Emergency Medicine | Admitting: Emergency Medicine

## 2023-03-28 ENCOUNTER — Emergency Department (HOSPITAL_COMMUNITY): Payer: Commercial Managed Care - HMO

## 2023-03-28 ENCOUNTER — Other Ambulatory Visit: Payer: Self-pay

## 2023-03-28 DIAGNOSIS — M542 Cervicalgia: Secondary | ICD-10-CM | POA: Diagnosis not present

## 2023-03-28 DIAGNOSIS — R079 Chest pain, unspecified: Secondary | ICD-10-CM

## 2023-03-28 DIAGNOSIS — Z7982 Long term (current) use of aspirin: Secondary | ICD-10-CM | POA: Diagnosis not present

## 2023-03-28 DIAGNOSIS — Z79899 Other long term (current) drug therapy: Secondary | ICD-10-CM | POA: Diagnosis not present

## 2023-03-28 DIAGNOSIS — N1831 Chronic kidney disease, stage 3a: Secondary | ICD-10-CM | POA: Insufficient documentation

## 2023-03-28 DIAGNOSIS — R2243 Localized swelling, mass and lump, lower limb, bilateral: Secondary | ICD-10-CM | POA: Diagnosis not present

## 2023-03-28 DIAGNOSIS — R011 Cardiac murmur, unspecified: Secondary | ICD-10-CM | POA: Diagnosis not present

## 2023-03-28 DIAGNOSIS — G8929 Other chronic pain: Secondary | ICD-10-CM | POA: Diagnosis not present

## 2023-03-28 DIAGNOSIS — R0789 Other chest pain: Secondary | ICD-10-CM | POA: Diagnosis present

## 2023-03-28 DIAGNOSIS — I129 Hypertensive chronic kidney disease with stage 1 through stage 4 chronic kidney disease, or unspecified chronic kidney disease: Secondary | ICD-10-CM | POA: Diagnosis not present

## 2023-03-28 LAB — HEPATIC FUNCTION PANEL
ALT: 23 U/L (ref 0–44)
AST: 20 U/L (ref 15–41)
Albumin: 2.6 g/dL — ABNORMAL LOW (ref 3.5–5.0)
Alkaline Phosphatase: 92 U/L (ref 38–126)
Bilirubin, Direct: <0.1 mg/dL (ref 0.0–0.2)
Total Bilirubin: 0.6 mg/dL (ref <1.2)
Total Protein: 6.8 g/dL (ref 6.5–8.1)

## 2023-03-28 LAB — CBC
HCT: 35.2 % — ABNORMAL LOW (ref 36.0–46.0)
Hemoglobin: 11.4 g/dL — ABNORMAL LOW (ref 12.0–15.0)
MCH: 26.3 pg (ref 26.0–34.0)
MCHC: 32.4 g/dL (ref 30.0–36.0)
MCV: 81.1 fL (ref 80.0–100.0)
Platelets: 159 10*3/uL (ref 150–400)
RBC: 4.34 MIL/uL (ref 3.87–5.11)
RDW: 15.7 % — ABNORMAL HIGH (ref 11.5–15.5)
WBC: 8.2 10*3/uL (ref 4.0–10.5)
nRBC: 0 % (ref 0.0–0.2)

## 2023-03-28 LAB — BASIC METABOLIC PANEL
Anion gap: 7 (ref 5–15)
BUN: 29 mg/dL — ABNORMAL HIGH (ref 8–23)
CO2: 21 mmol/L — ABNORMAL LOW (ref 22–32)
Calcium: 9.7 mg/dL (ref 8.9–10.3)
Chloride: 110 mmol/L (ref 98–111)
Creatinine, Ser: 0.95 mg/dL (ref 0.44–1.00)
GFR, Estimated: >60 mL/min (ref >60)
Glucose, Bld: 250 mg/dL — ABNORMAL HIGH (ref 70–99)
Potassium: 4.1 mmol/L (ref 3.5–5.1)
Sodium: 138 mmol/L (ref 135–145)

## 2023-03-28 LAB — TROPONIN I (HIGH SENSITIVITY)
Troponin I (High Sensitivity): 12 ng/L (ref <18)
Troponin I (High Sensitivity): 13 ng/L (ref <18)

## 2023-03-28 LAB — LIPASE, BLOOD: Lipase: 59 U/L — ABNORMAL HIGH (ref 11–51)

## 2023-03-28 MED ORDER — IOHEXOL 350 MG/ML SOLN
75.0000 mL | Freq: Once | INTRAVENOUS | Status: AC | PRN
  Administered 2023-03-28: 75 mL via INTRAVENOUS

## 2023-03-28 NOTE — ED Provider Notes (Signed)
Patient's care assumed at 6:30 AM.  Patient pending CT angio chest to rule out PE.  CT scan shows no evidence of PE.  Patient does have extensive lymphadenopathy.  Radiology raised concern for lymphoma.    Patient does have lymphoma and is currently being treated by Dr. Candise Che of oncology.  Patient reevaluated.  Vital signs stable.  Patient is discharged to return to [facility].   Elson Areas, [place] 03/28/23 0981    Jacalyn Lefevre, MD 03/28/23 [phone]

## 2023-03-28 NOTE — ED Triage Notes (Signed)
Pt BIB EMS from [facility] for chest pain, resolved on its own in route to ED. Pt reports heaviness that returned when she arrived to the hospital, pt also c/o neck pain.  EMS VS: 162/82 97% RA HR 72 CBG 325

## 2023-03-28 NOTE — ED Provider Triage Note (Signed)
Emergency Medicine Provider Triage Evaluation Note  Derrick Eppers , a 62 y.o. female  was evaluated in triage.  Pt complains of chest pain, difficulty speaking.  Review of Systems  Positive: Difficulty speaking, chest pain, worsening weakness in the limbs. Negative: Shortness of breath  Physical Exam  BP (!) 176/85 (BP Location: Left Arm)   Pulse 72   Temp 98.1 F (36.7 C) (Oral)   Resp 17   Ht 5\' 4"  (1.626 m)   Wt 68.9 kg   SpO2 99%   BMI 26.07 kg/m  Gen:   Awake, no distress both understand due to weakness. Resp:  Normal effort  MSK:   Previous CVA makes left side difficult to move.  Is unchanged from previous baseline Other:    Medical Decision Making  Medically screening exam initiated at 12:39 AM.  Appropriate orders placed.  Kerri-Lee Olesh was informed that the remainder of the evaluation will be completed by another provider, this initial triage assessment does not replace that evaluation, and the importance of remaining in the ED until their evaluation is complete.     Lunette Stands, [place] 03/28/23 [phone]

## 2023-03-28 NOTE — ED Notes (Signed)
Patient transported to CT 

## 2023-03-28 NOTE — ED Provider Notes (Signed)
[place] EMERGENCY DEPARTMENT AT [facility] Provider Note   CSN: [id number] Arrival date & time: 03/28/23  0015     History  Chief Complaint  Patient presents with   Chest Pain    [name] is a 62 y.o. female with history of CVA with left-sided hemiplegia who presents to the ED with concern for chest pressure that started while she was lying in bed at home at her facility [facility].  Chest pain resolved en route to the emergency department, transiently recurred in the ED and now has resolved again.  No associated shortness of breath.  No radiation of the pain.  Patient with chronic neck pain, unchanged prior to her arrival to the emergency department today.  Have reviewed her medical records.  In addition to bolus history is history of hypertension, hyperlipidemia, diabetes Stage IIIa CKD.  She is not anticoagulated. 05/31/22 echo with LVEF 60%. Negative stress test in 2022.   HPI     Home Medications Prior to Admission medications   Medication Sig Start Date End Date Taking? Authorizing Provider  acetaminophen (TYLENOL) 325 MG tablet Take 650 mg by mouth every 4 (four) hours as needed for mild pain (pain score 1-3) or fever. Do not exceed 3000mg  in 24 hours   Yes [provider]  amLODipine (NORVASC) 10 MG tablet Take 1 tablet by mouth once daily 05/09/22  Yes [place], [name], MD  antiseptic oral rinse (BIOTENE) LIQD 15 mLs by Mouth Rinse route 3 (three) times daily.   Yes [provider]  aspirin EC 81 MG tablet Take 1 tablet (81 mg total) by mouth daily. Swallow whole. 06/18/22  Yes Osvaldo Shipper, MD  atorvastatin (LIPITOR) 80 MG tablet Take 1 tablet (80 mg total) by mouth daily. 05/09/22  Yes [place], [name], MD  baclofen (LIORESAL) 10 MG tablet Take 5mg  twice daily x 1 week, then increase 10mg  twice daily.  Stay on lower dose if patient gets too sleepy. Patient taking differently: Take 10 mg by mouth 2 (two) times daily. 12/13/22  Yes Patel,  Donika K, DO  ezetimibe (ZETIA) 10 MG tablet Take 1 tablet (10 mg total) by mouth daily. 05/09/22  Yes [place], [name], MD  famotidine (PEPCID) 20 MG tablet Take 20 mg by mouth daily.   Yes [provider]  FLUoxetine (PROZAC) 20 MG capsule Take 1 capsule (20 mg total) by mouth daily. Patient taking differently: Take 40 mg by mouth daily. 09/27/22  Yes Rhetta Mura, MD  hydrOXYzine (ATARAX) 10 MG tablet Take 1 tablet (10 mg total) by mouth 3 (three) times daily as needed for anxiety. Patient taking differently: Take 25 mg by mouth daily. 09/27/22  Yes Rhetta Mura, MD  LANTUS SOLOSTAR 100 UNIT/ML Solostar Pen Inject 5 Units into the skin daily. Patient taking differently: Inject 10 Units into the skin daily. 10/17/22  Yes Pahwani, Daleen Bo, MD  lidocaine (LIDODERM) 5 % Place 1 patch onto the skin daily. Remove & Discard patch within 12 hours or as directed by MD 01/05/23  Yes Kommor, Madison, MD  losartan (COZAAR) 25 MG tablet Take 25 mg by mouth daily.   Yes [provider]  mesalamine (CANASA) 1000 MG suppository Place 1 suppository (1,000 mg total) rectally 2 (two) times daily. 10/17/22  Yes Pahwani, Daleen Bo, MD  pantoprazole (PROTONIX) 40 MG tablet Take 1 tablet (40 mg total) by mouth daily. 10/17/22 03/27/24 Yes Pahwani, Daleen Bo, MD  senna-docusate (SENOKOT-S) 8.6-50 MG tablet Take 1 tablet by mouth  daily as needed for mild constipation.   Yes [provider]  traMADol HCl 25 MG TABS Take 50 mg by mouth every 6 (six) hours as needed (for moderate pain). 11/16/22  Yes [provider]  allopurinol (ZYLOPRIM) 100 MG tablet Take 1 tablet (100 mg total) by mouth 2 (two) times daily. 03/19/23   Johney Maine, MD  alum & mag hydroxide-simeth (MAALOX PLUS) 400-400-40 MG/5ML suspension Take 30 mLs by mouth QID. prn    [provider]  B Complex Vitamins (VITAMIN B COMPLEX) TABS Take 1 tablet by mouth every morning.    [provider]   cyanocobalamin 1000 MCG tablet Take 1,000 mcg by mouth daily.    [provider]  diclofenac Sodium (VOLTAREN) 1 % GEL Apply 4 g topically 4 (four) times daily. 03/05/23   Melene Plan, DO  Dulaglutide (TRULICITY) 0.75 MG/0.5ML SOPN Inject 0.75 mg into the skin once a week. 04/13/22   Shamleffer, Konrad Dolores, MD  hydrocortisone cream 1 % Apply 1 Application topically 2 (two) times daily.    [provider]  ondansetron (ZOFRAN) 8 MG tablet Take 1 tablet (8 mg total) by mouth every 8 (eight) hours as needed for nausea or vomiting. 03/19/23   Johney Maine, MD  ONE TOUCH LANCETS MISC USE TO CHECK BLOOD SUGAR TWICE A DAY AND PRN 07/29/15   Gordy Savers, MD  OXYGEN Inhale 2 L/min into the lungs as needed.    [provider]  polyethylene glycol (MIRALAX / GLYCOLAX) 17 g packet Take 17 g by mouth every other day. 09/27/22   Rhetta Mura, MD  venetoclax (VENCLEXTA) 10 & 50 & 100 MG Starter Pack Take by mouth daily. Take 20 mg for 7 days, then 50 mg daily x 7d, then 100 mg daily x 7d, then 200 mg daily x 7d. Take with food & water. Patient not taking: Reported on 03/28/2023 03/19/23   Johney Maine, MD      Allergies    Patient has no known allergies.    Review of Systems   Review of Systems  Constitutional: Negative.   HENT: Negative.    Respiratory: Negative.    Cardiovascular:  Positive for chest pain. Negative for palpitations and leg swelling.  Gastrointestinal: Negative.   Neurological:  Negative for dizziness, facial asymmetry, light-headedness and headaches.    Physical Exam Updated Vital Signs BP (!) 169/88   Pulse 66   Temp 98.1 F (36.7 C) (Oral)   Resp 19   Ht 5\' 4"  (1.626 m)   Wt 68.9 kg   SpO2 99%   BMI 26.07 kg/m  Physical Exam Vitals and nursing note reviewed.  Constitutional:      Appearance: She is obese. She is not ill-appearing or toxic-appearing.  HENT:     Head: Normocephalic and atraumatic.      Mouth/Throat:     Mouth: Mucous membranes are moist.     Pharynx: No oropharyngeal exudate or posterior oropharyngeal erythema.  Eyes:     General:        Right eye: No discharge.        Left eye: No discharge.     Conjunctiva/sclera: Conjunctivae normal.  Cardiovascular:     Rate and Rhythm: Normal rate and regular rhythm.     Pulses: Normal pulses.     Heart sounds: Murmur heard.  Pulmonary:     Effort: Pulmonary effort is normal. No respiratory distress.     Breath sounds: Normal breath  sounds. No wheezing or rales.  Chest:     Chest wall: No mass, tenderness or edema.  Abdominal:     General: Bowel sounds are normal. There is no distension.     Palpations: Abdomen is soft.     Tenderness: There is no abdominal tenderness.  Musculoskeletal:        General: No deformity.     Cervical back: Neck supple.     Right lower leg: Edema present.     Left lower leg: Edema present.  Skin:    General: Skin is warm and dry.     Capillary Refill: Capillary refill takes less than 2 seconds.  Neurological:     Mental Status: She is alert. Mental status is at baseline.  Psychiatric:        Mood and Affect: Mood normal.     ED Results / Procedures / Treatments   Labs (all labs ordered are listed, but only abnormal results are displayed) Labs Reviewed  BASIC METABOLIC PANEL - Abnormal; Notable for the following components:      Result Value   CO2 21 (*)    Glucose, Bld 250 (*)    BUN 29 (*)    All other components within normal limits  CBC - Abnormal; Notable for the following components:   Hemoglobin 11.4 (*)    HCT 35.2 (*)    RDW 15.7 (*)    All other components within normal limits  HEPATIC FUNCTION PANEL  LIPASE, BLOOD  TROPONIN I (HIGH SENSITIVITY)  TROPONIN I (HIGH SENSITIVITY)    EKG EKG Interpretation Date/Time:  Tuesday March 28 2023 00:27:32 EST Ventricular Rate:  72 PR Interval:  132 QRS Duration:  84 QT Interval:  406 QTC Calculation: 444 R  Axis:   28  Text Interpretation: Normal sinus rhythm Nonspecific T wave abnormality Abnormal ECG When compared with ECG of 04-Mar-2023 22:53, PREVIOUS ECG IS PRESENT No significant change was found Confirmed by Glynn Octave [phone]) on 03/28/2023 12:33:01 AM  Radiology DG Chest 2 View  Result Date: 03/28/2023 CLINICAL DATA:  Chest pain EXAM: CHEST - 2 VIEW COMPARISON:  03/04/2023 FINDINGS: Stable cardiomediastinal silhouette. Aortic atherosclerotic calcification. Interval improvement of the faint patchy right upper lobe opacity compared with 03/04/2023 with residual prominent bronchial/interstitial markings in the right upper lobe. Platelike atelectasis in the lingula. No pleural effusion or pneumothorax. No displaced rib fractures. IMPRESSION: Interval improvement of the faint patchy right upper lobe opacity compared with 03/04/2023 with residual prominent bronchial/interstitial markings in the right upper lobe. Electronically Signed   By: Minerva Fester M.D.   On: 03/28/2023 01:41    Procedures Procedures    Medications Ordered in ED Medications - No data to display  ED Course/ Medical Decision Making/ A&P                                 Medical Decision Making 62 year old female with history of CVA presents with complaint of transient chest pressure now resolved.  Hypertensive on intake, no tachycardia, tachypnea, hypoxia or increased work of breathing.  Abdominal exam is benign.  Patient is at neurologic baseline with left-sided hemiplegia.  1+ bilateral lower extremity edema.  Clinical concern for PE is low at this time without tachypnea tachycardia, shortness of breath the patient cannot be PERC secondary to age and history of malignancy.  Amount and/or Complexity of Data Reviewed Labs: ordered.    Details: CBC with anemia of  11.4, BMP with hyperglycemia of 250, troponin negative, 12.  Radiology: ordered.  Risk Prescription drug management.   Hypertensive on intake and  vitals otherwise normal.  Clinical concern for PE low, however given patient is high risk PE study obtained.  Care of this patient signed out to oncoming ED provider Sophia PA-C at time of shift change. All pertinent HPI, physical exam, and laboratory findings were discussed with them prior to my departure. Disposition of patient pending completion of workup, reevaluation, and clinical judgement of oncoming ED provider.  Patient pending PE study at time of shift change.  Remains chest pain-free throughout her stay in the emergency department  This chart was dictated using voice recognition software, Dragon. Despite the best efforts of this provider to proofread and correct errors, errors may still occur which can change documentation meaning.          Final Clinical Impression(s) / ED Diagnoses Final diagnoses:  None    Rx / DC Orders ED Discharge Orders     None         Sherrilee Gilles 03/28/23 0650    Glynn Octave, MD 03/29/23 [phone]

## 2023-03-28 NOTE — Discharge Instructions (Addendum)
Follow up with Dr. Candise Che

## 2023-03-29 ENCOUNTER — Other Ambulatory Visit (HOSPITAL_COMMUNITY): Payer: Self-pay

## 2023-03-29 ENCOUNTER — Other Ambulatory Visit: Payer: Self-pay

## 2023-03-29 DIAGNOSIS — C8300 Small cell B-cell lymphoma, unspecified site: Secondary | ICD-10-CM

## 2023-04-03 ENCOUNTER — Inpatient Hospital Stay: Payer: Commercial Managed Care - HMO | Attending: Hematology

## 2023-04-10 ENCOUNTER — Ambulatory Visit (INDEPENDENT_AMBULATORY_CARE_PROVIDER_SITE_OTHER): Payer: Commercial Managed Care - HMO | Admitting: Gastroenterology

## 2023-04-10 ENCOUNTER — Inpatient Hospital Stay: Payer: Commercial Managed Care - HMO

## 2023-04-10 ENCOUNTER — Encounter: Payer: Self-pay | Admitting: Gastroenterology

## 2023-04-10 VITALS — BP 164/84 | HR 72

## 2023-04-10 DIAGNOSIS — K633 Ulcer of intestine: Secondary | ICD-10-CM

## 2023-04-10 DIAGNOSIS — K626 Ulcer of anus and rectum: Secondary | ICD-10-CM

## 2023-04-10 DIAGNOSIS — G610 Guillain-Barre syndrome: Secondary | ICD-10-CM

## 2023-04-10 DIAGNOSIS — Z7401 Bed confinement status: Secondary | ICD-10-CM | POA: Diagnosis not present

## 2023-04-10 DIAGNOSIS — Z993 Dependence on wheelchair: Secondary | ICD-10-CM

## 2023-04-10 DIAGNOSIS — K5904 Chronic idiopathic constipation: Secondary | ICD-10-CM

## 2023-04-10 DIAGNOSIS — G8929 Other chronic pain: Secondary | ICD-10-CM

## 2023-04-10 DIAGNOSIS — M549 Dorsalgia, unspecified: Secondary | ICD-10-CM | POA: Diagnosis not present

## 2023-04-10 NOTE — Progress Notes (Signed)
[name]    [id number]    1960-10-10  Primary Care Physician:Jordan, Timoteo Expose, MD  Referring Physician: [place], [name], MD [address] [place],  [place] [address]   Chief complaint: Rectal ulcer, chronic anemia  Discussed the use of AI scribe software for clinical note transcription with the patient, who gave verbal consent to proceed.  Last office visit 12/18/2022 with [name]  History of Present Illness   The patient, 62 year old very pleasant female, with a history of hemiplegia, paraparesis, wheelchair dependent and bedbound with chronic constipation and rectal ulcers, reports no recent bleeding and daily bowel movements. She describes her stools as neither hard nor diarrheal. She has been using suppositories, which have caused discomfort and a sensation of internal cutting. The patient has been refusing them recently. She will continue on a stool softener to prevent constipation.  The patient also reports back pain, which she attributes to prolonged periods of sitting or lying in the same position due to her inability to walk. She expresses frustration with her care team's infrequent repositioning and their perceived lack of understanding of the patient's pain. The patient is unable to reposition herself and relies on her care team for this.  The patient also mentions discomfort in the sacral area, particularly when not sitting upright. She expresses concern about the development of pressure ulcers or bedsores due to her immobility and prolonged pressure on certain areas. She also reports occasional urinary incontinence, which can lead to wetness in the sacral area. She expresses a desire for a numbing gel for relief and a need to keep the area dry to prevent bedsores.      07/08/2022 colonoscopy due to rectal bleeding and anemia on Brilinta shows 3 mm polyp ascending, 3 mm polyp descending, polypoid changes sigmoid colon, tortuous colon, poor anal tone  difficult to insufflate left colon/rectum, few ulcers in distal rectum, internal hemorrhoids Pathology showed active proctitis/no evidence of chronicity, granuloma, dysplasia or malignancy.  Negative CMV Patient had office visit with [name] 10/2022 at that time had nausea, vomiting and shortness of breath, sent to ER for evaluation found to have bilateral multifocal pneumonia and treated for severe sepsis.   CT abdomen pelvis was unremarkable.  Was given Rocephin azithromycin and Flagyl. 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   Started on mesalamine suppositories.   Outpatient Encounter Medications as of 04/10/2023  Medication Sig   acetaminophen (TYLENOL) 325 MG tablet Take 650 mg by mouth every 4 (four) hours as needed for mild pain (pain score 1-3) or fever. Do not exceed 3000mg  in 24 hours   amLODipine (NORVASC) 10 MG tablet Take 1 tablet by mouth once daily   antiseptic oral rinse (BIOTENE) LIQD 15 mLs by Mouth Rinse route 3 (three) times daily.   aspirin EC 81 MG tablet Take 1 tablet (81 mg total) by mouth daily. Swallow whole.   atorvastatin (LIPITOR) 80 MG tablet Take 1 tablet (80 mg total) by mouth daily.   baclofen (LIORESAL) 10 MG tablet Take 5mg  twice daily x 1 week, then increase 10mg  twice daily.  Stay on lower dose if patient gets too sleepy. (Patient taking differently: Take 10 mg by mouth 2 (two) times daily.)   ezetimibe (ZETIA) 10 MG tablet Take 1 tablet (10 mg total)  by mouth daily.   famotidine (PEPCID) 20 MG tablet Take 20 mg by mouth daily.   FLUoxetine (PROZAC) 20 MG capsule Take 1 capsule (20 mg total) by mouth daily. (Patient taking differently: Take 40 mg by mouth daily.)   hydrocortisone cream 1 % Apply 1 Application topically 2 (two) times daily.    hydrOXYzine (ATARAX) 10 MG tablet Take 1 tablet (10 mg total) by mouth 3 (three) times daily as needed for anxiety. (Patient taking differently: Take 25 mg by mouth 2 (two) times daily.)   LANTUS SOLOSTAR 100 UNIT/ML Solostar Pen Inject 5 Units into the skin daily. (Patient taking differently: Inject 10 Units into the skin daily.)   lidocaine (LIDODERM) 5 % Place 1 patch onto the skin daily. Remove & Discard patch within 12 hours or as directed by MD   losartan (COZAAR) 25 MG tablet Take 25 mg by mouth daily.   mesalamine (CANASA) 1000 MG suppository Place 1 suppository (1,000 mg total) rectally 2 (two) times daily.   ondansetron (ZOFRAN) 8 MG tablet Take 1 tablet (8 mg total) by mouth every 8 (eight) hours as needed for nausea or vomiting. (Patient not taking: Reported on 03/28/2023)   ONE TOUCH LANCETS MISC USE TO CHECK BLOOD SUGAR TWICE A DAY AND PRN   OXYGEN Inhale 2 L/min into the lungs as needed.   pantoprazole (PROTONIX) 40 MG tablet Take 1 tablet (40 mg total) by mouth daily.   senna-docusate (SENOKOT-S) 8.6-50 MG tablet Take 1 tablet by mouth daily as needed for mild constipation.   traMADol HCl 25 MG TABS Take 50 mg by mouth every 6 (six) hours as needed (for moderate pain).   [DISCONTINUED] allopurinol (ZYLOPRIM) 100 MG tablet Take 1 tablet (100 mg total) by mouth 2 (two) times daily. (Patient not taking: Reported on 03/28/2023)   [DISCONTINUED] diclofenac Sodium (VOLTAREN) 1 % GEL Apply 4 Mata topically 4 (four) times daily. (Patient not taking: Reported on 03/28/2023)   [DISCONTINUED] Dulaglutide (TRULICITY) 0.75 MG/0.5ML SOPN Inject 0.75 mg into the skin once a week. (Patient not taking: Reported on 03/28/2023)   [DISCONTINUED] venetoclax (VENCLEXTA) 10 & 50 & 100 MG Starter Pack Take by mouth daily. Take 20 mg for 7 days, then 50 mg daily x 7d, then 100 mg daily x 7d, then 200 mg daily x 7d. Take with food & water. (Patient not taking: Reported on 03/28/2023)   No facility-administered  encounter medications on file as of 04/10/2023.    Allergies as of 04/10/2023   (No Known Allergies)    Past Medical History:  Diagnosis Date   Abnormality of gait 05/10/2010   BACK PAIN 11/14/2008   Class 1 obesity due to excess calories with body mass index (BMI) of 31.0 to 31.9 in adult 02/07/2021   DIABETES MELLITUS, TYPE II 07/15/2008   Diplopia 07/15/2008   ECZEMA, ATOPIC 04/03/2009   GERD (gastroesophageal reflux disease)    Guillain-Barre (HCC) 1988   HYPERLIPIDEMIA 03/06/2009   HYPERTENSION 07/15/2008   Stroke (HCC) 2010, 2011   x2    Vertebral artery stenosis     Past Surgical History:  Procedure Laterality Date   ABDOMINAL HYSTERECTOMY     BIOPSY  07/08/2022   Procedure: BIOPSY;  Surgeon: Benancio Deeds, MD;  Location: [facility] ENDOSCOPY;  Service: Gastroenterology;;   BIOPSY  10/12/2022   Procedure: BIOPSY;  Surgeon: Shellia Cleverly, DO;  Location: [facility] ENDOSCOPY;  Service: Gastroenterology;;   COLONOSCOPY WITH PROPOFOL N/A 07/08/2022   Procedure: COLONOSCOPY WITH PROPOFOL;  Surgeon: Benancio Deeds, MD;  Location[facility] ENDOSCOPY;  Service: Gastroenterology;  Laterality: N/A;   DILATION AND CURETTAGE OF UTERUS     FLEXIBLE SIGMOIDOSCOPY N/A 10/12/2022   Procedure: FLEXIBLE SIGMOIDOSCOPY;  Surgeon: Shellia Cleverly, DO;  Location: [facility] ENDOSCOPY;  Service: Gastroenterology;  Laterality: N/A;   FOOT SURGERY     IR ANGIO INTRA EXTRACRAN SEL COM CAROTID INNOMINATE UNI L MOD SED  09/17/2021   IR ANGIO INTRA EXTRACRAN SEL INTERNAL CAROTID UNI R MOD SED  09/17/2021   IR ANGIO VERTEBRAL SEL VERTEBRAL UNI R MOD SED  09/17/2021   IR US GUIDE VASC ACCESS RIGHT  09/17/2021    Family History  Problem Relation Age of Onset   Diabetes Sister    Asthma Other    Stroke Other    Hypertension Other    Diabetes Mother    Stroke Mother    Cancer Father        pt states hae had some kind of stomach cancer, ? stomach or colon   Breast cancer Neg Hx     Social History    Socioeconomic History   Marital status: Divorced    Spouse name: Not on file   Number of children: 0   Years of education: 13   Highest education level: Not on file  Occupational History   Occupation: custodian at [employer]  Tobacco Use   Smoking status: Never   Smokeless tobacco: Never  Vaping Use   Vaping status: Never Used  Substance and Sexual Activity   Alcohol use: No   Drug use: No   Sexual activity: Not Currently  Other Topics Concern   Not on file  Social History Narrative   Right Handed    Lives in a one story home    No caffeine   Social Determinants of Health   Financial Resource Strain: Not on file  Food Insecurity: No Food Insecurity (09/24/2022)   Hunger Vital Sign    Worried About Running Out of Food in the Last Year: Never true    Ran Out of Food in the Last Year: Never true  Transportation Needs: No Transportation Needs (09/24/2022)   PRAPARE - Administrator, Civil Service (Medical): No    Lack of Transportation (Non-Medical): No  Physical Activity: Not on file  Stress: Not on file  Social Connections: Not on file  Intimate Partner Violence: Not At Risk (09/24/2022)   Humiliation, Afraid, Rape, and Kick questionnaire    Fear of Current or Ex-Partner: No    Emotionally Abused: No    Physically Abused: No    Sexually Abused: No      Review of systems: All other review of systems negative except as mentioned in the HPI.   Physical Exam: Vitals:   04/10/23 0835 04/10/23 0926  BP: (!) 170/84 (!) 164/84  Pulse: 72    There is no height or weight on file to calculate BMI. Gen:      No acute distress HEENT:  sclera anicteric CV: s1s2 rrr, no murmur Lungs: B/l clear. Abd:      soft, non-tender; no palpable masses, no distension Ext:    No edema Neuro: alert and oriented x 3 Psych: normal mood and affect  Data Reviewed:  Reviewed labs, radiology imaging, old records and pertinent past GI work up     Assessment  and Plan   62 year old very pleasant female with history of non-Hodgkin's lymphoma, peripheral vascular disease, Guillain-Barr syndrome, multiple strokes  with hemiplegia, paraparesis, bedbound, wheelchair dependent  Stercoral Ulcer No current bleeding or constipation. Previous colonoscopy showed irritation and ulcers likely due to constipation. Biopsies showed no precancer changes or chronic proctitis or colitis. -Discontinue mesalamine suppositories unless recurrent bleeding is noticed. -Start stool softener (Colace or docusate) 1 capsule daily to prevent constipation.  Back Pain Likely due to constant pressure from prolonged sitting/lying in one position. Patient is unable to walk and requires total assistance /Hoyer lift to change positions. -Recommend change in position every 2 hours to prevent pressure/decubitus ulcers. -Consider use of Recticare small pea-sized amount in the perianal area 3-4 times daily as needed for pain or discomfort.  General Health Maintenance -Ensure regular bowel movements (1-2 soft bowel movements daily). -Keep perianal area dry to prevent bed sores/decubitus ulcers. -Recommendation for care staff to assist with position changes every 2 hours.     Return as needed  The patient was provided an opportunity to ask questions and all were answered. The patient agreed with the plan and demonstrated an understanding of the instructions.  Iona Beard , MD    CC: [place], [name], MD

## 2023-04-10 NOTE — Patient Instructions (Addendum)
VISIT SUMMARY:  During today's visit, we discussed your ongoing issues with constipation, back pain, and concerns about pressure ulcers. We reviewed your current symptoms and made adjustments to your treatment plan to help manage your conditions more effectively.  YOUR PLAN:  -STERCORAL ULCER: Stercoral ulcers are sores in the colon caused by prolonged constipation. You have no current bleeding or constipation, and your previous colonoscopy showed irritation and ulcers likely due to constipation. We recommend discontinuing the use of suppositories unless you notice bleeding. Instead, start taking a stool softener like Colace or docusate once daily to prevent constipation.  -BACK PAIN: Your back pain is likely due to constant pressure from prolonged sitting or lying in one position, as you are unable to walk and need assistance to change positions. We recommend changing your position every 2 hours to prevent pressure ulcers. You can also use Recti care small pea size amount externally 3-4 times daily as needed in the perianal area as needed for pain or discomfort.  -GENERAL HEALTH MAINTENANCE: To maintain your general health, ensure you have regular bowel movements (1-2 soft bowel movements daily) and keep the sacral area dry to prevent bed sores. Your care staff should assist with position changes every 2 hours.  INSTRUCTIONS:  Please follow up with your care team to ensure they are assisting with position changes every 2 hours. Continue taking the stool softener daily and monitor for any signs of bleeding. If you experience any new symptoms or have concerns, please contact our office.  I appreciate the  opportunity to care for you  Thank You   Marsa Aris , MD

## 2023-04-11 ENCOUNTER — Encounter: Payer: Self-pay | Admitting: Neurology

## 2023-04-11 ENCOUNTER — Ambulatory Visit (INDEPENDENT_AMBULATORY_CARE_PROVIDER_SITE_OTHER): Payer: Commercial Managed Care - HMO | Admitting: Neurology

## 2023-04-11 VITALS — BP 143/88 | HR 97 | Ht 64.0 in

## 2023-04-11 DIAGNOSIS — I639 Cerebral infarction, unspecified: Secondary | ICD-10-CM | POA: Diagnosis not present

## 2023-04-11 DIAGNOSIS — I679 Cerebrovascular disease, unspecified: Secondary | ICD-10-CM

## 2023-04-11 DIAGNOSIS — R252 Cramp and spasm: Secondary | ICD-10-CM | POA: Diagnosis not present

## 2023-04-11 DIAGNOSIS — I69398 Other sequelae of cerebral infarction: Secondary | ICD-10-CM | POA: Diagnosis not present

## 2023-04-11 NOTE — Progress Notes (Signed)
Follow-up Visit   Date: 04/11/23   [name] MRN: [id number] DOB: 03-16-1961   Interim History: [name] is a 62 y.o. 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  She has history of prior stroke in 2010, pontine stroke (01/2021) and bilateral cerebellar stroke (08/2021).  The patient was accompanied to the clinic by caregiver.   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  - Increase baclofen to 10mg  three times daily  - Continue PT/OT/Speech therapy at [facility]  2.  Cerebrovascular disease with severe intracranial stenosis and marked functional debility resulting quadraparesis, worse on the left side and bilateral legs, dysphagia, and dysarthria.  She is dependent on all ADLs, except feeding. She has history of multiple strokes (2010, pontine stroke in 2022, cerebellar stroke 2023, and bilateral medullary stroke and left cerebellar stroke).  She has been on a number of antiplatelet regiments (aspirin + plavix, plavix + pletal, and aspirin + Brilinta).  She has completed 3 months of dual antiplatelet therapy with aspirin + Brilanta, and on monotherapy with aspirin 81mg  daily. She is aware that risk of stroke remains high. Management remains medical therapy.  She is on aspirin 81mg . Cholesterol (LDL) and diabetes (HbA1c 6.7) is well-controlled. Appreciate PCP management of secondary risk factors.    Return to clinic in 6 months  -------------------------------------------------- UPDATE 10/04/2022:  She was hospitalized in January for right medullary stroke presenting with gait change, diplopia, and dizziness.  She was discharged on plavix + pletal.  In February, she was readmitted  with slurred speech, shortness of breath, and worsening right side weakness with imaging showing extension of the right medullary stroke to the left side.    She was switched to aspirin + Brilinta and recommended to continue this for 3 months.   Since her hospitalization, her diabetes is much better managed and HbA1c 11.4 > 6.7 and LDL is also improved from 644 > 81.  She has been as SNF and getting PT/OT.  She has noticed that her hand strength is starting to improve. She is antigravity in the right arm and had some movement in the left hand.  She was able to stand with assistance, which is improved.   She complains of odd sensation and tight sensation over the neck since her last stroke.   UPDATE 12/13/2022:  She reports having tight sensation in her throat and across her neck.  Her speech has become more strained and soft.  She cannot speak loudly.  She has better strength in the right arm and is able to hold onto objects.  She is able to raise the left arm, but has no movement in the hand and now fingers are clawing him.  She is nonambulatory and wheelchair-bound.  She was getting PT/OT but due to insurance limitations, she is no longer getting this.   UPDATE 02/14/2023:  She was in the ER in September with headache, chest pain, and neck pain.  MRI/A brain showed interval new small right cerebellar infarct, chronic medullary encephalomalcia, and loss of most posterior circulation flow with minimal flow in the basilar, SCA, and PCAs.  Additionally, there is moderate to severe anterior circulation stenosis. Active lymphoma was seen on imaging.  She is hoping for her strength to return in the left arm and  legs.  She is well-chair dependent and nonambulatory.  Her right leg is moving less and she has minimal movement of the left arm and leg.  She continues to have tight neck pain.    UPDATE 04/11/2023:  She is here for follow-up visit.  She continues to have tight sensation and pressure in the neck.  No  interval hospitalizations or stroke.  She continues to be dependent on all ADLs, except for feeding and is nonambulatory.   Medications:  Current Outpatient Medications on File Prior to Visit  Medication Sig Dispense Refill   acetaminophen (TYLENOL) 325 MG tablet Take 650 mg by mouth every 4 (four) hours as needed for mild pain (pain score 1-3) or fever. Do not exceed 3000mg  in 24 hours     amLODipine (NORVASC) 10 MG tablet Take 1 tablet by mouth once daily 90 tablet 1   antiseptic oral rinse (BIOTENE) LIQD 15 mLs by Mouth Rinse route 3 (three) times daily.     aspirin EC 81 MG tablet Take 1 tablet (81 mg total) by mouth daily. Swallow whole. 30 tablet 12   atorvastatin (LIPITOR) 80 MG tablet Take 1 tablet (80 mg total) by mouth daily. 30 tablet 3   baclofen (LIORESAL) 10 MG tablet Take 5mg  twice daily x 1 week, then increase 10mg  twice daily.  Stay on lower dose if patient gets too sleepy. (Patient taking differently: Take 10 mg by mouth 2 (two) times daily.) 60 each 5   ezetimibe (ZETIA) 10 MG tablet Take 1 tablet (10 mg total) by mouth daily. 30 tablet 3   famotidine (PEPCID) 20 MG tablet Take 20 mg by mouth daily.     FLUoxetine (PROZAC) 20 MG capsule Take 1 capsule (20 mg total) by mouth daily. (Patient taking differently: Take 40 mg by mouth daily.) 30 capsule 0   hydrocortisone cream 1 % Apply 1 Application topically 2 (two) times daily.     hydrOXYzine (ATARAX) 10 MG tablet Take 1 tablet (10 mg total) by mouth 3 (three) times daily as needed for anxiety. (Patient taking differently: Take 25 mg by mouth 2 (two) times daily.) 30 tablet 0   LANTUS SOLOSTAR 100 UNIT/ML Solostar Pen Inject 5 Units into the skin daily. (Patient taking differently: Inject 10 Units into the skin daily.) 15 mL 11   lidocaine (LIDODERM) 5 % Place 1 patch onto the skin daily. Remove & Discard patch within 12 hours or as directed by MD 30 patch 0   losartan (COZAAR) 25 MG tablet Take 25 mg by mouth daily.      mesalamine (CANASA) 1000 MG suppository Place 1 suppository (1,000 mg total) rectally 2 (two) times daily. 30 suppository 12   ondansetron (ZOFRAN) 8 MG tablet Take 1 tablet (8 mg total) by mouth every 8 (eight) hours as needed for nausea or vomiting. 30 tablet 0   ONE TOUCH LANCETS MISC USE TO CHECK BLOOD SUGAR TWICE A DAY AND PRN 100 each 6   OXYGEN Inhale 2 L/min into the lungs as needed.     pantoprazole (PROTONIX) 40 MG tablet Take 1 tablet (40 mg total) by mouth daily. 30 tablet 0   senna-docusate (SENOKOT-S) 8.6-50 MG tablet Take 1 tablet by mouth daily as needed for mild constipation.     traMADol HCl 25 MG TABS Take 50 mg by mouth every 6 (six) hours as needed (for moderate pain).     No current facility-administered medications on file prior to visit.    Allergies: No Known  Allergies  Vital Signs:  BP (!) 143/88   Pulse 97   Ht 5\' 4"  (1.626 m)   SpO2 91%   BMI 26.07 kg/m   Neurological Exam: MENTAL STATUS including orientation to time, place, person, recent and remote memory, attention span and concentration, language, and fund of knowledge is normal.  Speech is midly dysarthric, soft, and moderately spastic.  CRANIAL NERVES:   Pupils equal round and reactive to light.  Normal conjugate, extra-ocular eye movements in all directions of gaze.  No ptosis .  Face is symmetric. Palate elevates symmetrically.  Tongue is midline.  MOTOR:  Moderate atrophy of the hands, worse on the left.  Generalized loss of muscle bulk in the legs, worse on the left.   RUE  5/5 proximally, 4/5 in the hand LUE  3/5 proximally, 2/5 in the hand RLE  2/5 proximally and distally LLE   1/5 throughout  MSRs:  Reflexes are 2+/4 throughout.  SENSORY:  Vibration and temperature intact throughout.  COORDINATION/GAIT:  Gait is not tested, patient in wheelchair  Data: NCS/EMG of the upper extremities 06/14/2018: The electrophysiologic findings are consistent with a sensorimotor polyneuropathy, with  axonal and demyelinating features.  Overall, these findings are moderate in degree electrically.  MRI head 01/05/2023:  1. Negative for acute infarct. 2. Expected evolution of medulflary encephalomalacia since February. New small but chronic right cerebellar infarcts since that time. Other brainstem and left cerebellar ischemia appears stable. 3. Progression of generalized cervical lymphadenopathy compatible with Active Lymphoma as on CTA today. 4. Loss of normal distal vertebral and proximal basilar artery flow voids concordant with CTA also. MRA today is reported separately.   MRA head 01/05/2023: 1. Abnormal Posterior Circulation concordant with CTA 0813 hours today: Loss of most posterior circulation flow signal in the setting of chronically occluded distal right vertebral, and interval occluded left vertebral artery V4 segments. Minimal flow detected now in the Basilar, SCAs and PCAs. The left PICA origin remains patent.   2. Stable MRI appearance of the Anterior Circulation since January, see anterior circulation atherosclerosis details on CTA this morning.   3. MRI today reported separately.  MRI brain wo contrast 06/12/2022: 1. Increased size of a recent right medullary infarct which now extends to the left of midline. 2. New punctate acute left cerebellar infarct. 3. Mild chronic small vessel ischemic disease. Chronic cerebellar infarcts.  MRI/A head and neck 05/30/2022: 1. Small acute infarct in the right aspect of the medulla without hemorrhage or mass effect. 2. Background chronic small-vessel ischemic change with small remote infarcts in the bilateral cerebellar hemispheres, one of which on the left is new since 09/15/2021. 3. Prominent lymph nodes in the upper neck consistent with the history of lymphoma, overall decreased in size since the prior studies from 09/15/2021 but incompletely evaluated.   MRA HEAD   1. Diminutive enhancement of the right V4 segment  with intermittent occlusion, and multifocal severe stenosis/near occlusion of the left V4 segment, similar to the prior CTA. 2. Additional intracranial atherosclerotic disease as detailed above resulting in moderate stenosis of the left cavernous ICA, mild-to-moderate stenosis of the right supraclinoid ICA, severe stenosis of the origin of the left superior M2 division, severe stenosis of the distal right M1 segment, and severe stenosis of the proximal left P1 segment.   MRA NECK   1. 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 2. Moderate stenosis of the origin of the left vertebral artery, not significantly  changed. 3. Patent carotid systems bilaterally.    Lab Results  Component Value Date   CHOL 126 06/13/2022   HDL 28 (L) 06/13/2022   LDLCALC 81 06/13/2022   LDLDIRECT 175.9 03/02/2012   TRIG 84 06/13/2022   CHOLHDL 4.5 06/13/2022   Lab Results  Component Value Date   HGBA1C 6.7 (A) 09/06/2022     Thank you for allowing me to participate in patient's care.  If I can answer any additional questions, I would be pleased to do so.    Sincerely,    Erich Kochan K. Allena Katz, DO

## 2023-04-11 NOTE — Patient Instructions (Addendum)
Increase baclofen to 10mg  three times daily  See you back in 6 months.

## 2023-04-14 ENCOUNTER — Other Ambulatory Visit: Payer: Self-pay

## 2023-04-14 ENCOUNTER — Ambulatory Visit (HOSPITAL_COMMUNITY)
Admission: RE | Admit: 2023-04-14 | Discharge: 2023-04-14 | Disposition: A | Discharge status: Home / Self Care | Payer: Commercial Managed Care - HMO | Source: Ambulatory Visit | Attending: Hematology | Admitting: Hematology

## 2023-04-14 DIAGNOSIS — C8300 Small cell B-cell lymphoma, unspecified site: Secondary | ICD-10-CM | POA: Insufficient documentation

## 2023-04-14 LAB — GLUCOSE, CAPILLARY: Glucose-Capillary: 110 mg/dL — ABNORMAL HIGH (ref 70–99)

## 2023-04-14 MED ORDER — FLUDEOXYGLUCOSE F - 18 (FDG) INJECTION
7.5600 | Freq: Once | INTRAVENOUS | Status: AC
  Administered 2023-04-14: 7.56 via INTRAVENOUS

## 2023-04-17 ENCOUNTER — Inpatient Hospital Stay: Payer: Commercial Managed Care - HMO

## 2023-04-18 ENCOUNTER — Other Ambulatory Visit: Payer: Self-pay | Admitting: Hematology

## 2023-04-18 ENCOUNTER — Other Ambulatory Visit: Payer: Self-pay

## 2023-04-18 ENCOUNTER — Telehealth: Payer: Self-pay

## 2023-04-18 ENCOUNTER — Other Ambulatory Visit: Payer: Self-pay | Admitting: Pharmacy Technician

## 2023-04-18 ENCOUNTER — Other Ambulatory Visit (HOSPITAL_COMMUNITY): Payer: Self-pay

## 2023-04-18 DIAGNOSIS — C8300 Small cell B-cell lymphoma, unspecified site: Secondary | ICD-10-CM

## 2023-04-18 MED ORDER — VENETOCLAX 10 & 50 & 100 MG PO TBPK: ORAL_TABLET | ORAL | 0 refills | Status: DC

## 2023-04-18 MED ORDER — VENETOCLAX 10 & 50 & 100 MG PO TBPK
ORAL_TABLET | ORAL | 0 refills | Status: DC
  Filled 2023-04-18: qty 42, 28d supply, fill #0

## 2023-04-18 NOTE — Progress Notes (Signed)
Patient and nursing staff counseled in telephone encounter on 04/18/2023.   Bethel Born, PharmD Hematology/Oncology Clinical Pharmacist [facility] [phone]

## 2023-04-18 NOTE — Progress Notes (Signed)
Specialty Pharmacy Initial Fill Coordination Note  [name] is a 62 y.o. female contacted today regarding refills of specialty medication(s) Venetoclax (VENCLEXTA) .  Patient requested Delivery  on 04/20/23  to verified address ATTN: [name] [address] [place], [place] [address]   Medication will be filled on 04/19/23.   Patient is aware of $0 copayment.

## 2023-04-18 NOTE — Telephone Encounter (Signed)
Oral Oncology Pharmacist Encounter  Prescription regarding venetoclax has been re-sent by MD See Juanell Fairly note on 03/20/23 regarding drug interactions, etc. Patient had new labs drawn at current home. Waiting for labs to arrive to see how patient is currently doing.   Labs (CBC, CMP, uric acid, and phos) will be completed on either 12/18 or 12/19 at [facility] and will be faxed to RN prior to starting on 04/21/23.   Venclexta will be titrated up weekly by the nursing staff at [facility]. Nursing staff notified to how to handle the medication and that patient will need weekly labs. Counseled patient on administration, dosing, side effects, monitoring, drug-food interactions, safe handling, storage, and disposal.  Patient will take Venclexta 10mg  tablets, 2 tablets (20 mg) by mouth once daily with food and water for 7 days.  Venclexta start date: 04/21/2023.   Patient was instructed to increase fluid intake to 1.5 - 2L of water per day starting 2 days prior to [facility] initiation.  Patient will take Venclexta 50mg  tablets, 1 tablet by mouth once daily with food and water for 7 days. Subsequent ramp-up doses of 100mg  daily x 7 days, 200mg  daily x 7 days, and 400mg  daily (target maintenance dose) will be monitored per protocol.  Adverse effects include but are not limited to: TLS, decreased blood counts, electrolyte abnormalities, diarrhea, nausea, fatigue, arthralgias/myalgias, and upper respiratory tract infection.    Patient has anti-emetic on hand and knows to take it if nausea develops.   Patient will obtain anti diarrheal and alert the office of 4 or more loose stools above baseline.  Reviewed with patient importance of keeping a medication schedule and plan for any missed doses. No barriers to medication adherence identified.  Medication reconciliation performed and medication/allergy list updated.  Insurance authorization for Johna Sheriff has been obtained. Test claim at the  pharmacy revealed copayment $0 for 1st fill of 28 days. This will ship from the [facility] outpatient pharmacy on 04/18/2023 to deliver to patient's home on 04/20/2023.  Patient informed the pharmacy will reach out 5-7 days prior to needing next fill of Venclexta to coordinate continued medication acquisition to prevent break in therapy.  All questions answered. Patient and nursing staff voiced understanding and appreciation.   Medication education handout placed in mail for patient. Patient knows to call the office with questions or concerns. Oral Chemotherapy Clinic phone number provided to patient.   Bethel Born, PharmD Hematology/Oncology Clinical Pharmacist [facility] [phone] 04/18/2023 11:06 AM

## 2023-04-19 ENCOUNTER — Other Ambulatory Visit: Payer: Self-pay

## 2023-04-21 ENCOUNTER — Other Ambulatory Visit (HOSPITAL_COMMUNITY): Payer: Self-pay

## 2023-04-21 MED ORDER — ALLOPURINOL 100 MG PO TABS
100.0000 mg | ORAL_TABLET | Freq: Two times a day (BID) | ORAL | 0 refills | Status: DC
  Filled 2023-04-21: qty 60, 30d supply, fill #0

## 2023-04-24 ENCOUNTER — Other Ambulatory Visit (HOSPITAL_COMMUNITY): Payer: Self-pay

## 2023-04-24 ENCOUNTER — Inpatient Hospital Stay: Payer: Commercial Managed Care - HMO | Admitting: Hematology

## 2023-04-24 ENCOUNTER — Inpatient Hospital Stay: Payer: Commercial Managed Care - HMO

## 2023-04-24 NOTE — Telephone Encounter (Signed)
Oral Oncology Pharmacist Encounter   Patient had baseline labs completed prior to starting the venetoclax on Friday, 12/20. Reviewed labs and patient is not in active TLS and did not require the prescription for allopurinol to be sent on saturday. [name] faxed labs on Monday 12/23 (although discussed with patients RN on Friday night). Labs forwarded to MDs nurse.   Patients next labs to be drawn are on 12/25 in order to review prior to starting second dose ramp up on Friday, 12/27.   Bethel Born, PharmD Hematology/Oncology Clinical Pharmacist [facility] [phone]

## 2023-05-02 ENCOUNTER — Encounter: Payer: Self-pay | Admitting: Family Medicine

## 2023-05-04 ENCOUNTER — Other Ambulatory Visit (HOSPITAL_COMMUNITY): Payer: Self-pay

## 2023-05-04 ENCOUNTER — Other Ambulatory Visit: Payer: Self-pay | Admitting: Hematology

## 2023-05-04 ENCOUNTER — Other Ambulatory Visit: Payer: Self-pay

## 2023-05-04 DIAGNOSIS — C8300 Small cell B-cell lymphoma, unspecified site: Secondary | ICD-10-CM

## 2023-05-04 NOTE — Progress Notes (Addendum)
 Specialty Pharmacy Ongoing Clinical Assessment Note  Spoke to [name], sister of [name] is a 63 y.o. female who is being followed by the specialty pharmacy service for RxSp Oncology   Patient's specialty medication(s) reviewed today: Venetoclax  (VENCLEXTA )   Missed doses in the last 4 weeks: 0   Patient/Caregiver asked additional questions regarding side effects, see intervention notes below.  Therapeutic benefit summary: Unable to assess   Adverse events/side effects summary: Experienced adverse events/side effects (Per patient's sister, patient has been complaining of headaches and face stiffness, according to her sister the facial stiffnessd has been an ongoing issue following a stroke, she reports no swelling.)   Patient's therapy is appropriate to: Continue    Goals Addressed             This Visit's Progress    Maintain optimal adherence to therapy   No change    Patient is initiating therapy. Patient will maintain adherence         Follow up:  3 months  Silvano LOISE [name] Specialty Pharmacist   Clinical Intervention Note  Clinical Intervention Notes: Per patient's sister, patient has been complaining of headaches and face stiffness, according to her sister the facial stiffnessd has been an ongoing issue following a stroke, she reports no swelling.  The headaches come and go and are tolerable at this time.  I counseled her that these can be a side effect of the Venclexta , but the facial stiffness would not be a typical side effect.  She was understanding and has called the MD to see if they can get a sooner appointment.   Clinical Intervention Outcomes: Prevention of an adverse drug event   Silvano LOISE [name] [name]

## 2023-05-04 NOTE — Progress Notes (Addendum)
 Specialty Pharmacy Refill Coordination Note  Spoke to [name], sister of [name] a 63 y.o. female contacted today regarding refills of specialty medication(s) Venetoclax  (VENCLEXTA )   Patient requested Delivery   Delivery date: 05/12/23   Verified address: [address], ATTN: [name], [place], [place] [address]   Medication will be filled on 05/11/23 *Pending refill request for maintenance dose.

## 2023-05-09 ENCOUNTER — Telehealth: Payer: Self-pay

## 2023-05-09 NOTE — Telephone Encounter (Signed)
 Oral Oncology Pharmacist Encounter   Patient started the venetoclax  100mg  dose on 05/05/23. 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  See labs below.   Pertinent labs from 05/05/23: Uric acid: 4.7 mg/dL Phosphorus: 3.6 mg/dL WBC: 3.9 k/uL Plts: 786 k/uL Potassium: 4.2 mmol/L Calcium : 9.4 mg/dL Serum creatinine: 9.18  Patients next lab appointment at the facility is on 05/10/23 prior to dose increase too 200mg  on Friday, 05/12/23. I will continue to follow. MD notified.   Efstathios Sawin, PharmD Hematology/Oncology Clinical Pharmacist [facility] [phone]

## 2023-05-12 ENCOUNTER — Other Ambulatory Visit (HOSPITAL_COMMUNITY): Payer: Self-pay

## 2023-05-12 ENCOUNTER — Other Ambulatory Visit: Payer: Self-pay

## 2023-05-12 ENCOUNTER — Telehealth: Payer: Self-pay | Admitting: Pharmacy Technician

## 2023-05-12 MED ORDER — VENETOCLAX 100 MG PO TABS
200.0000 mg | ORAL_TABLET | Freq: Every day | ORAL | 0 refills | Status: DC
  Filled 2023-05-12: qty 56, 28d supply, fill #0

## 2023-05-12 NOTE — Progress Notes (Signed)
 Refills approved. Maintenance dose needs PA. Messaged Alan Ripper.

## 2023-05-12 NOTE — Telephone Encounter (Signed)
 Oral Oncology Patient Advocate Encounter   Received notification that prior authorization for Venclexta  is required.   PA submitted on 05/12/23 PA submitted via phone [phone]  Status is pending     [name], CPhT-Adv Oncology Pharmacy Patient Advocate [facility] Direct Number: [phone]  Fax: [phone]

## 2023-05-15 ENCOUNTER — Other Ambulatory Visit (HOSPITAL_COMMUNITY): Payer: Self-pay

## 2023-05-15 ENCOUNTER — Telehealth: Payer: Self-pay | Admitting: Pharmacy Technician

## 2023-05-15 ENCOUNTER — Other Ambulatory Visit: Payer: Self-pay

## 2023-05-15 NOTE — Telephone Encounter (Signed)
 Oral Oncology Patient Advocate Encounter   Was successful in obtaining a copay card for Venclexta .  This copay card will make the patients copay $0.  The billing information is as follows and has been shared with [facility].   RxBin: [id number] PCN: [id number] Member ID: [id number] Group ID: [id number]   Estefana Moellers, CPhT-Adv Oncology Pharmacy Patient Advocate [facility] Direct Number: [phone]  Fax: [phone]

## 2023-05-15 NOTE — Telephone Encounter (Signed)
 Oral Oncology Patient Advocate Encounter  Case ID: [id number]. Status still pending.  [name], CPhT-Adv Oncology Pharmacy Patient Advocate [facility] Direct Number: [phone]  Fax: [phone]

## 2023-05-15 NOTE — Telephone Encounter (Signed)
 Oral Oncology Patient Advocate Encounter  Prior Authorization for Venclexta  has been approved.    PA# [id number] Effective dates: 05/15/23 through 05/14/24  Patients co-pay is $150.    Estefana Moellers, CPhT-Adv Oncology Pharmacy Patient Advocate [facility] Direct Number: [phone]  Fax: [phone]

## 2023-05-16 ENCOUNTER — Other Ambulatory Visit: Payer: Self-pay

## 2023-05-29 ENCOUNTER — Inpatient Hospital Stay: Payer: Commercial Managed Care - HMO

## 2023-05-29 ENCOUNTER — Inpatient Hospital Stay: Payer: Commercial Managed Care - HMO | Attending: Hematology | Admitting: Hematology

## 2023-05-29 VITALS — BP 168/81 | HR 76 | Temp 97.4°F | Resp 18 | Ht 64.0 in

## 2023-05-29 DIAGNOSIS — C8300 Small cell B-cell lymphoma, unspecified site: Secondary | ICD-10-CM | POA: Diagnosis not present

## 2023-05-29 DIAGNOSIS — C8304 Small cell B-cell lymphoma, lymph nodes of axilla and upper limb: Secondary | ICD-10-CM | POA: Insufficient documentation

## 2023-05-29 DIAGNOSIS — I129 Hypertensive chronic kidney disease with stage 1 through stage 4 chronic kidney disease, or unspecified chronic kidney disease: Secondary | ICD-10-CM | POA: Diagnosis not present

## 2023-05-29 DIAGNOSIS — N1832 Chronic kidney disease, stage 3b: Secondary | ICD-10-CM | POA: Diagnosis not present

## 2023-05-29 DIAGNOSIS — E785 Hyperlipidemia, unspecified: Secondary | ICD-10-CM | POA: Insufficient documentation

## 2023-05-29 DIAGNOSIS — E1122 Type 2 diabetes mellitus with diabetic chronic kidney disease: Secondary | ICD-10-CM | POA: Insufficient documentation

## 2023-05-29 LAB — CBC WITH DIFFERENTIAL (CANCER CENTER ONLY)
Abs Immature Granulocytes: 0.01 10*3/uL (ref 0.00–0.07)
Basophils Absolute: 0 10*3/uL (ref 0.0–0.1)
Basophils Relative: 1 %
Eosinophils Absolute: 0 10*3/uL (ref 0.0–0.5)
Eosinophils Relative: 1 %
HCT: 34.9 % — ABNORMAL LOW (ref 36.0–46.0)
Hemoglobin: 11.8 g/dL — ABNORMAL LOW (ref 12.0–15.0)
Immature Granulocytes: 0 %
Lymphocytes Relative: 34 %
Lymphs Abs: 1.1 10*3/uL (ref 0.7–4.0)
MCH: 26.2 pg (ref 26.0–34.0)
MCHC: 33.8 g/dL (ref 30.0–36.0)
MCV: 77.6 fL — ABNORMAL LOW (ref 80.0–100.0)
Monocytes Absolute: 0.5 10*3/uL (ref 0.1–1.0)
Monocytes Relative: 15 %
Neutro Abs: 1.6 10*3/uL — ABNORMAL LOW (ref 1.7–7.7)
Neutrophils Relative %: 49 %
Platelet Count: 186 10*3/uL (ref 150–400)
RBC: 4.5 MIL/uL (ref 3.87–5.11)
RDW: 14.8 % (ref 11.5–15.5)
WBC Count: 3.3 10*3/uL — ABNORMAL LOW (ref 4.0–10.5)
nRBC: 0 % (ref 0.0–0.2)

## 2023-05-29 LAB — CMP (CANCER CENTER ONLY)
ALT: 22 U/L (ref 0–44)
AST: 17 U/L (ref 15–41)
Albumin: 3.3 g/dL — ABNORMAL LOW (ref 3.5–5.0)
Alkaline Phosphatase: 90 U/L (ref 38–126)
Anion gap: 4 — ABNORMAL LOW (ref 5–15)
BUN: 33 mg/dL — ABNORMAL HIGH (ref 8–23)
CO2: 27 mmol/L (ref 22–32)
Calcium: 10.3 mg/dL (ref 8.9–10.3)
Chloride: 107 mmol/L (ref 98–111)
Creatinine: 0.93 mg/dL (ref 0.44–1.00)
GFR, Estimated: >60 mL/min (ref >60)
Glucose, Bld: 158 mg/dL — ABNORMAL HIGH (ref 70–99)
Potassium: 4.1 mmol/L (ref 3.5–5.1)
Sodium: 138 mmol/L (ref 135–145)
Total Bilirubin: 0.4 mg/dL (ref 0.0–1.2)
Total Protein: 7.4 g/dL (ref 6.5–8.1)

## 2023-05-29 LAB — PHOSPHORUS: Phosphorus: 3.8 mg/dL (ref 2.5–4.6)

## 2023-05-29 LAB — URIC ACID: Uric Acid, Serum: 3.8 mg/dL (ref 2.5–7.1)

## 2023-05-29 NOTE — Progress Notes (Signed)
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 The pt reports that she is doing well overall.   The pt reports that her axillary lymphadenopathy was picked up during a routing Mammogram on 05/31/2019. Pt denies feeling any lumps or bumps in either axillary regions. Pt has felt the same for the last 6-12 months.   She has HTN and Diabetes. Her HTN is currently well-controlled but her Diabetes has been harder to control. Pt notes that "sometimes it's up and sometimes it's down". 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 She feels that the reaction on her left side is a little slower than her right side, but nothing that's noticeable. She denies any weakness, vision changes, or other residual symptoms from either stroke. 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 Pt made a full recovery and has no remaining symptoms. Her eczema is seasonal and not very bothersome. She denies any concerns for frequent infections. Pt has neuropathy in her hands/fingers and has been prescribed Gabapentin, which is helping.   Pt does not drink much alcohol and has never been a smoker. Her father passed from Lung Cancer and had a history of smoking. Her mother passed from a stroke last spring. 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 She is living alone and has one sister in the area.   Pt has had her first dose of the  COVID19 vaccine and tolerated it well. She has the second dose scheduled.   Of note prior to the patient's visit today, pt has had Flow Pathology ([id number]) completed on 06/26/2019 with results revealing "Monoclonal B-cell population identified."  Pt has had Right Axilla LN Bx ([id number]) completed on 06/26/2019 with results revealing "NON-HODGKIN B-CELL LYMPHOMA"   Pt has also had Mammogram [phone] [id number]) completed on 05/31/2019 with results revealing "Further evaluation is suggested for prominent lymph nodes in the bilateral axilla."  Most recent lab results (03/13/2019) of CBC and CMP is as follows: all values are WNL except for RBC at 5.61, MCV at 78.8, MCH at 25.8, Sodium at 131, Glucose at 502, BUN at 24.  On review of systems, pt reports hard stools, tingling/numbness in fingers/hands and denies weakness, vision changes, fevers, chills, night sweats, SOB, fatigue, constipation, diarrhea and any other symptoms.   On PMHx the pt reports Guillain-Barr syndrome, Type II Diabetes, Atopic Eczema, HLD, HTN, Stroke x2, Neuropathy. On Social Hx the pt reports that she does not drink much alcohol and is a non-smoker.  INTERVAL HISTORY:  [name] is a wonderful 63 y.o. female who is here for continued evaluation and management of her small lymphocytic lymphoma.  Patient was last seen by me on 03/13/2023 and she complained of neck pain/numbness, enlarged lymph nodes near her right and left armpit, occasional right leg swelling, occasional SOB, and bilateral hand neuropathy/swelling.   Patient is accompanied by her family member during this visit. Patient notes she has been doing well overall since our  last visit. She has started her low-dose Venetoclax treatment. She has been tolerating the treatment well without any new or severe toxicities. Patient's family member notes that she was having mild pain throughout her body when she first started her treatment. She denies any body pain after  her second treatment.   Patient complains of bilateral hand stiffness, mild neck/jaw tightness, mild occasional headache, and bilateral leg swelling. She notes that her neck/jaw tightness improves after appetite.   Patient's family notes that most of her symptoms are post-stroke related instead of her lymphoma.   She denies any new infection issues, fever, chills, night sweats, unexpected weight loss, back pain, chest pain, fatigue, abdominal pain. She has been eating well and has been staying well-hydrated. She does complain of mild SOB.    MEDICAL HISTORY:  Past Medical History:  Diagnosis Date   Abnormality of gait 05/10/2010   BACK PAIN 11/14/2008   Class 1 obesity due to excess calories with body mass index (BMI) of 31.0 to 31.9 in adult 02/07/2021   DIABETES MELLITUS, TYPE II 07/15/2008   Diplopia 07/15/2008   ECZEMA, ATOPIC 04/03/2009   GERD (gastroesophageal reflux disease)    Guillain-Barre (HCC) 1988   HYPERLIPIDEMIA 03/06/2009   HYPERTENSION 07/15/2008   Stroke (HCC) 2010, 2011   x2    Vertebral artery stenosis     SURGICAL HISTORY: Past Surgical History:  Procedure Laterality Date   ABDOMINAL HYSTERECTOMY     BIOPSY  07/08/2022   Procedure: BIOPSY;  Surgeon: Benancio Deeds, MD;  Location: [facility] ENDOSCOPY;  Service: Gastroenterology;;   BIOPSY  10/12/2022   Procedure: BIOPSY;  Surgeon: Shellia Cleverly, DO;  Location: [facility] ENDOSCOPY;  Service: Gastroenterology;;   COLONOSCOPY WITH PROPOFOL N/A 07/08/2022   Procedure: COLONOSCOPY WITH PROPOFOL;  Surgeon: Benancio Deeds, MD;  Location: [facility] ENDOSCOPY;  Service: Gastroenterology;  Laterality: N/A;   DILATION AND CURETTAGE OF UTERUS     FLEXIBLE SIGMOIDOSCOPY N/A 10/12/2022   Procedure: FLEXIBLE SIGMOIDOSCOPY;  Surgeon: Shellia Cleverly, DO;  Location: [facility] ENDOSCOPY;  Service: Gastroenterology;  Laterality: N/A;   FOOT SURGERY     IR ANGIO INTRA EXTRACRAN SEL COM CAROTID INNOMINATE UNI L MOD SED  09/17/2021   IR  ANGIO INTRA EXTRACRAN SEL INTERNAL CAROTID UNI R MOD SED  09/17/2021   IR ANGIO VERTEBRAL SEL VERTEBRAL UNI R MOD SED  09/17/2021   IR US GUIDE VASC ACCESS RIGHT  09/17/2021    SOCIAL HISTORY: Social History   Socioeconomic History   Marital status: Divorced    Spouse name: Not on file   Number of children: 0   Years of education: 13   Highest education level: Not on file  Occupational History   Occupation: custodian at [employer]  Tobacco Use   Smoking status: Never   Smokeless tobacco: Never  Vaping Use   Vaping status: Never Used  Substance and Sexual Activity   Alcohol use: No   Drug use: No   Sexual activity: Not Currently  Other Topics Concern   Not on file  Social History Narrative   Right Handed    Lives in a one story home    No caffeine   Social Drivers of Corporate investment banker Strain: Not on file  Food Insecurity: No Food Insecurity (09/24/2022)   Hunger Vital Sign    Worried About Running Out of Food in the Last Year: Never true    Ran Out of Food in the Last Year: Never true  Transportation Needs: No Transportation Needs (09/24/2022)   PRAPARE - Administrator, Civil Service (Medical): No    Lack of Transportation (Non-Medical): No  Physical Activity: Not on file  Stress: Not on file  Social Connections: Not on file  Intimate Partner Violence: Not At Risk (09/24/2022)   Humiliation, Afraid, Rape, and Kick questionnaire    Fear of Current or Ex-Partner: No    Emotionally Abused: No    Physically Abused: No    Sexually Abused: No    FAMILY HISTORY: Family History  Problem Relation Age of Onset   Diabetes Sister    Asthma Other    Stroke Other    Hypertension Other    Diabetes Mother    Stroke Mother    Cancer Father        pt states hae had some kind of stomach cancer, ? stomach or colon   Breast cancer Neg Hx     ALLERGIES:  has no known allergies.  MEDICATIONS:  Current Outpatient Medications  Medication Sig  Dispense Refill   acetaminophen (TYLENOL) 325 MG tablet Take 650 mg by mouth every 4 (four) hours as needed for mild pain (pain score 1-3) or fever. Do not exceed 3000mg  in 24 hours     allopurinol (ZYLOPRIM) 100 MG tablet Take 1 tablet (100 mg total) by mouth 2 (two) times daily. 60 tablet 0   amLODipine (NORVASC) 10 MG tablet Take 1 tablet by mouth once daily 90 tablet 1   antiseptic oral rinse (BIOTENE) LIQD 15 mLs by Mouth Rinse route 3 (three) times daily.     aspirin EC 81 MG tablet Take 1 tablet (81 mg total) by mouth daily. Swallow whole. 30 tablet 12   atorvastatin (LIPITOR) 80 MG tablet Take 1 tablet (80 mg total) by mouth daily. 30 tablet 3   baclofen (LIORESAL) 10 MG tablet Take 5mg  twice daily x 1 week, then increase 10mg  twice daily.  Stay on lower dose if patient gets too sleepy. (Patient taking differently: Take 10 mg by mouth 2 (two) times daily.) 60 each 5   ezetimibe (ZETIA) 10 MG tablet Take 1 tablet (10 mg total) by mouth daily. 30 tablet 3   famotidine (PEPCID) 20 MG tablet Take 20 mg by mouth daily.     FLUoxetine (PROZAC) 20 MG capsule Take 1 capsule (20 mg total) by mouth daily. (Patient taking differently: Take 40 mg by mouth daily.) 30 capsule 0   hydrocortisone cream 1 % Apply 1 Application topically 2 (two) times daily.     hydrOXYzine (ATARAX) 10 MG tablet Take 1 tablet (10 mg total) by mouth 3 (three) times daily as needed for anxiety. (Patient taking differently: Take 25 mg by mouth 2 (two) times daily.) 30 tablet 0   LANTUS SOLOSTAR 100 UNIT/ML Solostar Pen Inject 5 Units into the skin daily. (Patient taking differently: Inject 10 Units into the skin daily.) 15 mL 11   lidocaine (LIDODERM) 5 % Place 1 patch onto the skin daily. Remove & Discard patch within 12 hours or as directed by MD 30 patch 0   losartan (COZAAR) 25 MG tablet Take 25 mg by mouth daily.     mesalamine (CANASA) 1000 MG suppository Place 1 suppository (1,000 mg total) rectally 2 (two) times daily. 30  suppository 12   ondansetron (ZOFRAN) 8 MG tablet Take 1 tablet (8 mg total) by mouth every 8 (eight) hours as needed for nausea or vomiting. 30 tablet 0   ONE TOUCH  LANCETS MISC USE TO CHECK BLOOD SUGAR TWICE A DAY AND PRN 100 each 6   OXYGEN Inhale 2 L/min into the lungs as needed.     pantoprazole (PROTONIX) 40 MG tablet Take 1 tablet (40 mg total) by mouth daily. 30 tablet 0   senna-docusate (SENOKOT-S) 8.6-50 MG tablet Take 1 tablet by mouth daily as needed for mild constipation.     traMADol HCl 25 MG TABS Take 50 mg by mouth every 6 (six) hours as needed (for moderate pain).     venetoclax (VENCLEXTA) 10 & 50 & 100 MG Starter Pack Take by mouth daily starting on 04/21/23. Take 20 mg daily for 7 days, then 50 mg daily x 7d, then 100 mg daily x 7d, then 200 mg daily x 7d. Take with food. 42 tablet 0   venetoclax (VENCLEXTA) 100 MG tablet Take 2 tablets (200 mg total) by mouth daily. Tablets should be swallowed whole with a meal and a full glass of water. 60 tablet 0   No current facility-administered medications for this visit.    REVIEW OF SYSTEMS:   10 Point review of Systems was done is negative except as noted above.  PHYSICAL EXAMINATION: ECOG PERFORMANCE STATUS: 0 - Asymptomatic Vitals:   05/29/23 1009  BP: (!) 168/81  Pulse: 76  Resp: 18  Temp: (!) 97.4 F (36.3 C)  SpO2: 100%   GENERAL:alert, in no acute distress and comfortable SKIN: no acute rashes, no significant lesions EYES: conjunctiva are pink and non-injected, sclera anicteric OROPHARYNX: MMM, no exudates, no oropharyngeal erythema or ulceration NECK: supple, no JVD LYMPH: small cervical and axillary LN,  no palpable lymphadenopathy in the inguinal regions LUNGS: clear to auscultation b/l with normal respiratory effort HEART: regular rate & rhythm ABDOMEN:  normoactive bowel sounds , non tender, not distended. Extremity: no pedal edema   LABORATORY DATA:  I have reviewed the data as listed  .    Latest  Ref Rng & Units 05/29/2023    9:43 AM 03/28/2023   12:44 AM 03/13/2023    9:32 AM  CBC  WBC 4.0 - 10.5 K/uL 3.3  8.2  7.2   Hemoglobin 12.0 - 15.0 g/dL 16.1  09.6  04.5   Hematocrit 36.0 - 46.0 % 34.9  35.2  35.4   Platelets 150 - 400 K/uL 186  159  202     .    Latest Ref Rng & Units 05/29/2023    9:43 AM 03/28/2023    2:40 AM 03/28/2023   12:44 AM  CMP  Glucose 70 - 99 mg/dL 409   811   BUN 8 - 23 mg/dL 33   29   Creatinine 9.14 - 1.00 mg/dL 7.82   9.56   Sodium 213 - 145 mmol/L 138   138   Potassium 3.5 - 5.1 mmol/L 4.1   4.1   Chloride 98 - 111 mmol/L 107   110   CO2 22 - 32 mmol/L 27   21   Calcium 8.9 - 10.3 mg/dL 08.6   9.7   Total Protein 6.5 - 8.1 g/dL 7.4  6.8    Total Bilirubin 0.0 - 1.2 mg/dL 0.4  0.6    Alkaline Phos 38 - 126 U/L 90  92    AST 15 - 41 U/L 17  20    ALT 0 - 44 U/L 22  23     . Lab Results  Component Value Date   LDH 228 (H) 03/13/2023  Alpha-Thalassemia Comment:  Comment: (NOTE) Test: Alpha-Thalassemia, DNA Analysis Result:     Alpha-+-thalassemia trait, alpha alpha/alpha- Mutation(s) identified: alpha3.7    07/16/2019 FISH-CLL Prognostic Panel:         RADIOGRAPHIC STUDIES: I have personally reviewed the radiological images as listed and agreed with the findings in the report. No results found.  ASSESSMENT & PLAN:    63 yo with   1) Small lymphocytic lymphoma-at least stage IIIA-trisomy 12 mutation Diagnosed incidentally as LNadenopathy noted on routine screening mamogram. No consitutional symptoms.  05/31/2019 Mammogram ([id number]) revealed "Further evaluation is suggested for prominent lymph nodes in the bilateral axilla." 06/26/2019 Flow Pathology ([id number]) revealed "Monoclonal B-cell population identified." 06/26/2019 Right Axilla LN Bx ([id number]) revealed "NON-HODGKIN B-CELL LYMPHOMA"  07/16/2019 FISH/CLL Prognostic Panel ([id number]) revealed "Trisomy 12 (+12) is present."  10/15/2019 CT Soft Tissue Neck  ([id number]) revealed bilateral cervical lymphadenopathy. 10/15/2019 CT C/A/P ([id number]) ([id number]) revealed several small lymph nodes in axillary, retroperitoneal, and pelvic regions. Incidentally noted complex renal cyst.   2) RBC microcytosis without Anemia and normal RDW and relative erythrocytosis -- due to alpha thal trait Alpha-Thalassemia Comment:  Comment: (NOTE) Test: Alpha-Thalassemia, DNA Analysis Result:     Alpha-+-thalassemia trait, alpha alpha/alpha- Mutation(s) identified: alpha3.7    3) nonmelanoma skin cancer on lower extremities. -Continue follow-up with dermatology for evaluation and management. -I discussed with the patient that her non-Hodgkin's lymphoma/SLL could be a risk factor for recurrent nonmelanoma skin cancers.  4) . Patient Active Problem List   Diagnosis Date Noted   Protein-calorie malnutrition, severe 10/08/2022   Severe sepsis (HCC) 10/05/2022   Dehydration 10/05/2022   Acute prerenal azotemia 10/05/2022   Proctitis 10/05/2022   Hypercalcemia 10/05/2022   Transaminitis 10/05/2022   Pyuria 10/05/2022   Depression 10/05/2022   CAP (community acquired pneumonia) 10/04/2022   Malnutrition of moderate degree 09/26/2022   SOB (shortness of breath) 09/25/2022   Nausea vomiting and diarrhea 09/24/2022   Shortness of breath 09/24/2022   Acute respiratory failure with hypoxia (HCC) 09/24/2022   LFT elevation 09/24/2022   Diabetes mellitus (HCC) 09/06/2022   Rectal ulcer 07/09/2022   Colon ulcer 07/08/2022   Rectal bleeding 07/07/2022   Anemia 07/07/2022   GI bleed 07/06/2022   Dysarthria 06/12/2022   Aspiration pneumonia (HCC) 06/12/2022   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   Anxiety disorder due to medical condition 06/10/2022   AKI (acute kidney injury) (HCC) 06/01/2022   Dizziness 05/29/2022   Personal history of CLL (chronic lymphocytic leukemia) 05/29/2022   Rectal pain 05/29/2022   Type 2 diabetes mellitus with stage 3b chronic  kidney disease, with long-term current use of insulin (HCC) 04/14/2022   Type 2 diabetes mellitus with diabetic polyneuropathy, with long-term current use of insulin (HCC) 04/14/2022   Antiplatelet or antithrombotic long-term use 03/16/2022   HLD (hyperlipidemia) 10/01/2021   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HCC) 09/17/2021   Stage 3a chronic kidney disease (CKD) (HCC) 09/16/2021   Thyroid nodule 09/16/2021   Cervical lymphadenopathy 09/16/2021   Acute CVA (cerebrovascular accident) (HCC) 09/15/2021   GERD (gastroesophageal reflux disease) 04/30/2021   Peripheral arterial disease (HCC) 04/13/2021   Atherosclerosis of aorta (HCC) 02/14/2021   Cerebral thrombosis with cerebral infarction 02/08/2021   Abnormal MRI of head 02/07/2021   Vertigo 02/07/2021   Class 1 obesity due to excess calories with body mass index (BMI) of 31.0 to 31.9 in adult 02/07/2021   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   Trigger finger, left ring finger 10/04/2018   Alternating  constipation and diarrhea 04/20/2018   Diabetic peripheral neuropathy associated with type 2 diabetes mellitus (HCC) 02/26/2018   DM2 (diabetes mellitus, type 2) (HCC) 07/29/2015   ABNORMALITY OF GAIT 05/10/2010   ECZEMA, ATOPIC 04/03/2009   Hyperlipidemia associated with type 2 diabetes mellitus (HCC) 03/06/2009   BACK PAIN 11/14/2008   Hemiparesis affecting left side as late effect of stroke (HCC) 08/05/2008   DIPLOPIA 07/15/2008   Essential hypertension 07/15/2008   PLAN: -Discussed lab results from today, 05/29/2023, in detail with the patient. CBC shows low WBC of 3.3 K, low hemoglobin of 11.8 g/dL with hematocrit of 09.8%. CMP shows slightly elevated BUN of 33, but well overall.  -Patient has been tolerating his Venetoclax @ 200mg  p daily well without any new or severe toxicities.  -Patient will continue to proceed with her current low-dose Venetoclax treatment, 200 mg.  -Discussed with the patient that she will get a  PET/CT scan 6 months after starting her treatment or new progressive symptoms. -Continue Allopurinol for now. Discussed with the patient that we might discontinue Allopurinol after our next visit in couple of months.  -Answered all of patient's questions.    FOLLOW-UP: RTC with Dr Candise Lisa with labs in 2 months  The total time spent in the appointment was 30 minutes* .  All of the patient's questions were answered with apparent satisfaction. The patient knows to call the clinic with any problems, questions or concerns.   Wyvonnia Lora MD MS AAHIVMS [facility] Hematology/Oncology Physician [facility]  .*Total Encounter Time as defined by the Centers for Medicare and Medicaid Services includes, in addition to the face-to-face time of a patient visit (documented in the note above) non-face-to-face time: obtaining and reviewing outside history, ordering and reviewing medications, tests or procedures, care coordination (communications with other health care professionals or caregivers) and documentation in the medical record.   I,Param Shah,acting as a Neurosurgeon for Wyvonnia Lora, MD.,have documented all relevant documentation on the behalf of Wyvonnia Lora, MD,as directed by  Wyvonnia Lora, MD while in the presence of Wyvonnia Lora, MD.  .I have reviewed the above documentation for accuracy and completeness, and I agree with the above. Johney Maine MD

## 2023-05-31 ENCOUNTER — Ambulatory Visit: Payer: Commercial Managed Care - HMO | Attending: Cardiovascular Disease | Admitting: Cardiovascular Disease

## 2023-05-31 ENCOUNTER — Encounter: Payer: Self-pay | Admitting: Cardiovascular Disease

## 2023-05-31 VITALS — BP 130/72 | HR 76 | Wt 160.0 lb

## 2023-05-31 DIAGNOSIS — I739 Peripheral vascular disease, unspecified: Secondary | ICD-10-CM

## 2023-05-31 NOTE — Patient Instructions (Signed)
    Follow-Up: At [facility], you and your health needs are our priority.  As part of our continuing mission to provide you with exceptional heart care, we have created designated Provider Care Teams.  These Care Teams include your primary Cardiologist (physician) and Advanced Practice Providers (APPs -  Physician Assistants and Nurse Practitioners) who all work together to provide you with the care you need, when you need it.  We recommend signing up for the patient portal called "MyChart".  Sign up information is provided on this After Visit Summary.  MyChart is used to connect with patients for Virtual Visits (Telemedicine).  Patients are able to view lab/test results, encounter notes, upcoming appointments, etc.  Non-urgent messages can be sent to your provider as well.   To learn more about what you can do with MyChart, go to [email or web address].    Your next appointment:   As needed

## 2023-05-31 NOTE — Progress Notes (Signed)
05/31/2023 [name]   03-Feb-1961  [id number]  Primary Physician [place], [name], MD Primary Cardiologist: Runell Gess MD Milagros Loll, [place], [place]  HPI:  [name] is a 63 y.o.  moderately overweight divorced African-American female with no children who currently is unemployed because of a recent stroke. She worked as a Arboriculturist prior to that. She was referred by Dr. Carolan Clines for evaluation of symptomatic PAD.  I last saw her in the office 07/13/2021.  She is accompanied by her sister and niece today.  Risk factors include treated hypertension, diabetes and hyperlipidemia. 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 She is currently undergoing rehab and walks with a cane. She lives with her sister. She denies chest pain or shortness of breath. She does complain of some left greater than right lower extremity symptoms which have some claudication-like features. She had recent Dopplers performed 03/22/2021 revealed a normal right ABI with a left ABI of 0.82 and a high-frequency signal in her proximal left SFA.  Since I saw her last 2 years ago she did have a recurrent stroke a year ago that has been disabling.  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  Is also affected her speech.  She currently lives at [facility] skilled nursing facility.   Current Meds  Medication Sig   acetaminophen (TYLENOL) 325 MG tablet Take 650 mg by mouth every 4 (four) hours as needed for mild pain (pain score 1-3) or fever. Do not exceed 3000mg  in 24 hours   allopurinol (ZYLOPRIM) 100 MG tablet Take 1 tablet (100 mg total) by mouth 2 (two) times daily.   amLODipine (NORVASC) 10 MG tablet Take 1 tablet by mouth once daily   antiseptic oral rinse (BIOTENE) LIQD 15 mLs by Mouth Rinse route 3 (three) times daily.   aspirin EC 81 MG tablet Take 1 tablet (81 mg total) by mouth  daily. Swallow whole.   atorvastatin (LIPITOR) 80 MG tablet Take 1 tablet (80 mg total) by mouth daily.   baclofen (LIORESAL) 10 MG tablet Take 5mg  twice daily x 1 week, then increase 10mg  twice daily.  Stay on lower dose if patient gets too sleepy. (Patient taking differently: Take 10 mg by mouth 2 (two) times daily.)   ezetimibe (ZETIA) 10 MG tablet Take 1 tablet (10 mg total) by mouth daily.   famotidine (PEPCID) 20 MG tablet Take 20 mg by mouth daily.   FLUoxetine (PROZAC) 40 MG capsule Take 40 mg by mouth daily.   hydrocortisone cream 1 % Apply 1 Application topically 2 (two) times daily.   hydrOXYzine (ATARAX) 25 MG tablet Take 25 mg by mouth 2 (two) times daily.   LANTUS SOLOSTAR 100 UNIT/ML Solostar Pen Inject 5 Units into the skin daily. (Patient taking differently: Inject 10 Units into the skin daily.)   lidocaine (LIDODERM) 5 % Place 1 patch onto the skin daily. Remove & Discard patch within 12 hours or as directed by MD   losartan (COZAAR) 25 MG tablet Take 25 mg by mouth daily.   ONE TOUCH LANCETS MISC USE TO CHECK BLOOD SUGAR TWICE A DAY AND PRN   pantoprazole (PROTONIX) 40 MG tablet Take 1 tablet (40 mg total) by mouth daily.   traMADol HCl 25 MG TABS Take 50 mg by mouth every 6 (six) hours as needed (for moderate pain).   venetoclax (  VENCLEXTA) 100 MG tablet Take 2 tablets (200 mg total) by mouth daily. Tablets should be swallowed whole with a meal and a full glass of water.     No Known Allergies  Social History   Socioeconomic History   Marital status: Divorced    Spouse name: Not on file   Number of children: 0   Years of education: 13   Highest education level: Not on file  Occupational History   Occupation: custodian at [employer]  Tobacco Use   Smoking status: Never   Smokeless tobacco: Never  Vaping Use   Vaping status: Never Used  Substance and Sexual Activity   Alcohol use: No   Drug use: No   Sexual activity: Not Currently  Other Topics Concern    Not on file  Social History Narrative   Right Handed    Lives in a one story home    No caffeine   Social Drivers of Corporate investment banker Strain: Not on file  Food Insecurity: No Food Insecurity (09/24/2022)   Hunger Vital Sign    Worried About Running Out of Food in the Last Year: Never true    Ran Out of Food in the Last Year: Never true  Transportation Needs: No Transportation Needs (09/24/2022)   PRAPARE - Administrator, Civil Service (Medical): No    Lack of Transportation (Non-Medical): No  Physical Activity: Not on file  Stress: Not on file  Social Connections: Not on file  Intimate Partner Violence: Not At Risk (09/24/2022)   Humiliation, Afraid, Rape, and Kick questionnaire    Fear of Current or Ex-Partner: No    Emotionally Abused: No    Physically Abused: No    Sexually Abused: No     Review of Systems: General: negative for chills, fever, night sweats or weight changes.  Cardiovascular: negative for chest pain, dyspnea on exertion, edema, orthopnea, palpitations, paroxysmal nocturnal dyspnea or shortness of breath Dermatological: negative for rash Respiratory: negative for cough or wheezing Urologic: negative for hematuria Abdominal: negative for nausea, vomiting, diarrhea, bright red blood per rectum, melena, or hematemesis Neurologic: negative for visual changes, syncope, or dizziness All other systems reviewed and are otherwise negative except as noted above.    Blood pressure 130/72, pulse 76, weight 160 lb (72.6 kg), SpO2 95%.  General appearance: alert and no distress Neck: no adenopathy, no carotid bruit, no JVD, supple, symmetrical, trachea midline, and thyroid not enlarged, symmetric, no tenderness/mass/nodules Lungs: clear to auscultation bilaterally Heart: regular rate and rhythm, S1, S2 normal, no murmur, click, rub or gallop Extremities: extremities normal, atraumatic, no cyanosis or edema Pulses: 2+ and symmetric Skin: Skin  color, texture, turgor normal. No rashes or lesions Neurologic: Grossly normal  EKG not performed today      ASSESSMENT AND PLAN:   Peripheral arterial disease (HCC) [name] was referred to me by Dr. Wyline Mood for PAD.  I last saw her in the office 07/13/2021.  She did have Doppler studies performed 03/22/2021 that revealed a high-frequency signal in her proximal left SFA.  Since I saw her she did have a disabling stroke a year ago involving the left side of her body and is now wheelchair-bound and nonambulatory.  At this point, no further evaluation is warranted for PAD.  I will see her back as needed.     Runell Gess MD FACP,FACC,FAHA, [facility] 05/31/2023 10:21 AM

## 2023-05-31 NOTE — Assessment & Plan Note (Signed)
[name] was referred to me by Dr. Wyline Mood for PAD.  I last saw her in the office 07/13/2021.  She did have Doppler studies performed 03/22/2021 that revealed a high-frequency signal in her proximal left SFA.  Since I saw her she did have a disabling stroke a year ago involving the left side of her body and is now wheelchair-bound and nonambulatory.  At this point, no further evaluation is warranted for PAD.  I will see her back as needed.

## 2023-06-06 ENCOUNTER — Other Ambulatory Visit: Payer: Self-pay

## 2023-06-09 ENCOUNTER — Other Ambulatory Visit (HOSPITAL_COMMUNITY): Payer: Self-pay

## 2023-06-09 ENCOUNTER — Encounter: Payer: Self-pay | Admitting: Family Medicine

## 2023-06-12 ENCOUNTER — Other Ambulatory Visit: Payer: Self-pay | Admitting: Hematology

## 2023-06-12 ENCOUNTER — Other Ambulatory Visit (HOSPITAL_COMMUNITY): Payer: Self-pay

## 2023-06-12 ENCOUNTER — Other Ambulatory Visit: Payer: Self-pay

## 2023-06-12 MED ORDER — VENETOCLAX 100 MG PO TABS
200.0000 mg | ORAL_TABLET | Freq: Every day | ORAL | 0 refills | Status: DC
  Filled 2023-06-12: qty 56, 28d supply, fill #0

## 2023-06-12 NOTE — Progress Notes (Signed)
 Specialty Pharmacy Refill Coordination Note  [name] is a 63 y.o. female contacted today regarding refills of specialty medication(s) Venetoclax  (VENCLEXTA )   Patient requested Delivery   Delivery date: 06/14/23   Verified address: [address], ATTN: [name], [place], [place] [address]   Medication will be filled on 02.11.25 or when refill approved.  Refill request pending.

## 2023-06-13 ENCOUNTER — Other Ambulatory Visit (HOSPITAL_COMMUNITY): Payer: Self-pay

## 2023-06-14 ENCOUNTER — Other Ambulatory Visit (HOSPITAL_COMMUNITY): Payer: Self-pay

## 2023-06-16 ENCOUNTER — Other Ambulatory Visit: Payer: Self-pay

## 2023-06-19 ENCOUNTER — Other Ambulatory Visit: Payer: Self-pay

## 2023-06-20 ENCOUNTER — Other Ambulatory Visit: Payer: Self-pay

## 2023-06-21 ENCOUNTER — Other Ambulatory Visit (HOSPITAL_COMMUNITY): Payer: Self-pay

## 2023-06-22 ENCOUNTER — Other Ambulatory Visit: Payer: Self-pay

## 2023-06-22 ENCOUNTER — Other Ambulatory Visit (HOSPITAL_COMMUNITY): Payer: Self-pay

## 2023-06-23 ENCOUNTER — Other Ambulatory Visit (HOSPITAL_COMMUNITY): Payer: Self-pay

## 2023-06-24 ENCOUNTER — Other Ambulatory Visit (HOSPITAL_COMMUNITY): Payer: Self-pay

## 2023-06-26 ENCOUNTER — Other Ambulatory Visit: Payer: Self-pay

## 2023-06-29 ENCOUNTER — Other Ambulatory Visit: Payer: Self-pay

## 2023-07-17 ENCOUNTER — Other Ambulatory Visit: Payer: Self-pay

## 2023-07-19 ENCOUNTER — Other Ambulatory Visit: Payer: Self-pay

## 2023-07-19 ENCOUNTER — Other Ambulatory Visit: Payer: Self-pay | Admitting: Hematology

## 2023-07-19 MED ORDER — VENETOCLAX 100 MG PO TABS
200.0000 mg | ORAL_TABLET | Freq: Every day | ORAL | 0 refills | Status: DC
  Filled 2023-07-19: qty 60, 30d supply, fill #0
  Filled 2023-07-20: qty 56, 28d supply, fill #0

## 2023-07-19 NOTE — Progress Notes (Signed)
 Specialty Pharmacy Refill Coordination Note  [name] is a 63 y.o. female contacted today regarding refills of specialty medication(s) Venetoclax Johna Sheriff)  Spoke with Nurse at patient facility  Patient requested Delivery   Delivery date: 07/21/23   Verified address: [address], [address], [place] [address]   Medication will be filled on 03.20.25.

## 2023-07-20 ENCOUNTER — Other Ambulatory Visit (HOSPITAL_COMMUNITY): Payer: Self-pay

## 2023-07-20 ENCOUNTER — Other Ambulatory Visit: Payer: Self-pay

## 2023-07-25 ENCOUNTER — Other Ambulatory Visit: Payer: Self-pay

## 2023-07-25 DIAGNOSIS — C8300 Small cell B-cell lymphoma, unspecified site: Secondary | ICD-10-CM

## 2023-07-25 NOTE — Progress Notes (Signed)
 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 The pt reports that she is doing well overall.   The pt reports that her axillary lymphadenopathy was picked up during a routing Mammogram on 05/31/2019. Pt denies feeling any lumps or bumps in either axillary regions. Pt has felt the same for the last 6-12 months.   She has HTN and Diabetes. Her HTN is currently well-controlled but her Diabetes has been harder to control. Pt notes that "sometimes it's up and sometimes it's down". 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 She feels that the reaction on her left side is a little slower than her right side, but nothing that's noticeable. She denies any weakness, vision changes, or other residual symptoms from either stroke. 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 Pt made a full recovery and has no remaining symptoms. Her eczema is seasonal and not very bothersome. She denies any concerns for frequent infections. Pt has neuropathy in her hands/fingers and has been prescribed Gabapentin, which is helping.   Pt does not drink much alcohol and has never been a smoker. Her father passed from Lung Cancer and had a history of smoking. Her mother passed from a stroke last spring. 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 She is living alone and has one sister in the area.   Pt has had her first dose of the  COVID19 vaccine and tolerated it well. She has the second dose scheduled.   Of note prior to the patient's visit today, pt has had Flow Pathology ([id number]) completed on 06/26/2019 with results revealing "Monoclonal B-cell population identified."  Pt has had Right Axilla LN Bx ([id number]) completed on 06/26/2019 with results revealing "NON-HODGKIN B-CELL LYMPHOMA"   Pt has also had Mammogram [phone]) completed on 05/31/2019 with results revealing "Further evaluation is suggested for prominent lymph nodes in the bilateral axilla."  Most recent lab results (03/13/2019) of CBC and CMP is as follows: all values are WNL except for RBC at 5.61, MCV at 78.8, MCH at 25.8, Sodium at 131, Glucose at 502, BUN at 24.  On review of systems, pt reports hard stools, tingling/numbness in fingers/hands and denies weakness, vision changes, fevers, chills, night sweats, SOB, fatigue, constipation, diarrhea and any other symptoms.   On PMHx the pt reports Guillain-Barr syndrome, Type II Diabetes, Atopic Eczema, HLD, HTN, Stroke x2, Neuropathy. On Social Hx the pt reports that she does not drink much alcohol and is a non-smoker.  INTERVAL HISTORY:   [name] is a wonderful 63 y.o. female who is here for continued evaluation and management of her small lymphocytic lymphoma.  Patient was last seen by me on 05/29/2023 and complained of bilateral hand stiffness, mild neck/jaw tightness, mild occasional headache, bilateral leg swelling, and mild SOB. Her family members reported that most of her symptoms were post-stroke related. She also reported mild generalized body pain which had resolved.   Today, she presents in  a wheelchair and is accompanied by a family member. Patient complains of bothersome post-stroke symptoms.   Patient reports abnormal blood vessels and bleeding in the retina. Patient reports that her eye sight is variable.   She has been tolerating venetoclax well with no significant  toxicity issues. She denies any concern for nausea, unexplained fever, chills, or abdominal pain. Patient has normal po intake and bowel habits.   She reports endorsing leg swelling when sitting for long periods. Her leg swelling improves when laying down. She has no significant leg swelling at this time.   She reports having elevated blood pressure a few days ago. Her blood pressure in clinic today is 179/100.   She reports having weekly blood tests.   MEDICAL HISTORY:  Past Medical History:  Diagnosis Date   Abnormality of gait 05/10/2010   BACK PAIN 11/14/2008   Class 1 obesity due to excess calories with body mass index (BMI) of 31.0 to 31.9 in adult 02/07/2021   DIABETES MELLITUS, TYPE II 07/15/2008   Diplopia 07/15/2008   ECZEMA, ATOPIC 04/03/2009   GERD (gastroesophageal reflux disease)    Guillain-Barre (HCC) 1988   HYPERLIPIDEMIA 03/06/2009   HYPERTENSION 07/15/2008   Stroke (HCC) 2010, 2011   x2    Vertebral artery stenosis     SURGICAL HISTORY: Past Surgical History:  Procedure Laterality Date   ABDOMINAL HYSTERECTOMY     BIOPSY  07/08/2022   Procedure: BIOPSY;  Surgeon: Benancio Deeds, MD;  Location: [facility] ENDOSCOPY;  Service: Gastroenterology;;   BIOPSY  10/12/2022   Procedure: BIOPSY;  Surgeon: Shellia Cleverly, DO;  Location: [facility] ENDOSCOPY;  Service: Gastroenterology;;   COLONOSCOPY WITH PROPOFOL N/A 07/08/2022   Procedure: COLONOSCOPY WITH PROPOFOL;  Surgeon: Benancio Deeds, MD;  Location: [facility] ENDOSCOPY;  Service: Gastroenterology;  Laterality: N/A;   DILATION AND CURETTAGE OF UTERUS     FLEXIBLE SIGMOIDOSCOPY N/A 10/12/2022   Procedure: FLEXIBLE SIGMOIDOSCOPY;  Surgeon: Shellia Cleverly, DO;  Location: [facility] ENDOSCOPY;  Service: Gastroenterology;  Laterality: N/A;   FOOT SURGERY     IR ANGIO INTRA EXTRACRAN SEL COM CAROTID INNOMINATE UNI L MOD SED  09/17/2021   IR ANGIO INTRA EXTRACRAN SEL INTERNAL CAROTID UNI R MOD SED  09/17/2021   IR ANGIO VERTEBRAL SEL  VERTEBRAL UNI R MOD SED  09/17/2021   IR US GUIDE VASC ACCESS RIGHT  09/17/2021    SOCIAL HISTORY: Social History   Socioeconomic History   Marital status: Divorced    Spouse name: Not on file   Number of children: 0   Years of education: 13   Highest education level: Not on file  Occupational History   Occupation: custodian at [employer]  Tobacco Use   Smoking status: Never   Smokeless tobacco: Never  Vaping Use   Vaping status: Never Used  Substance and Sexual Activity   Alcohol use: No   Drug use: No   Sexual activity: Not Currently  Other Topics Concern   Not on file  Social History Narrative   Right Handed    Lives in a one story home    No caffeine   Social Drivers of Corporate investment banker Strain: Not on file  Food Insecurity: No Food Insecurity (09/24/2022)   Hunger Vital Sign    Worried About Running Out of Food in the Last Year: Never true    Ran Out of Food in the Last Year: Never true  Transportation Needs: No Transportation Needs (09/24/2022)  PRAPARE - Administrator, Civil Service (Medical): No    Lack of Transportation (Non-Medical): No  Physical Activity: Not on file  Stress: Not on file  Social Connections: Not on file  Intimate Partner Violence: Not At Risk (09/24/2022)   Humiliation, Afraid, Rape, and Kick questionnaire    Fear of Current or Ex-Partner: No    Emotionally Abused: No    Physically Abused: No    Sexually Abused: No    FAMILY HISTORY: Family History  Problem Relation Age of Onset   Diabetes Sister    Asthma Other    Stroke Other    Hypertension Other    Diabetes Mother    Stroke Mother    Cancer Father        pt states hae had some kind of stomach cancer, ? stomach or colon   Breast cancer Neg Hx     ALLERGIES:  has no known allergies.  MEDICATIONS:  Current Outpatient Medications  Medication Sig Dispense Refill   acetaminophen (TYLENOL) 325 MG tablet Take 650 mg by mouth every 4 (four) hours  as needed for mild pain (pain score 1-3) or fever. Do not exceed 3000mg  in 24 hours     allopurinol (ZYLOPRIM) 100 MG tablet Take 1 tablet (100 mg total) by mouth 2 (two) times daily. 60 tablet 0   amLODipine (NORVASC) 10 MG tablet Take 1 tablet by mouth once daily 90 tablet 1   antiseptic oral rinse (BIOTENE) LIQD 15 mLs by Mouth Rinse route 3 (three) times daily.     aspirin EC 81 MG tablet Take 1 tablet (81 mg total) by mouth daily. Swallow whole. 30 tablet 12   atorvastatin (LIPITOR) 80 MG tablet Take 1 tablet (80 mg total) by mouth daily. 30 tablet 3   baclofen (LIORESAL) 10 MG tablet Take 5mg  twice daily x 1 week, then increase 10mg  twice daily.  Stay on lower dose if patient gets too sleepy. (Patient taking differently: Take 10 mg by mouth 2 (two) times daily.) 60 each 5   ezetimibe (ZETIA) 10 MG tablet Take 1 tablet (10 mg total) by mouth daily. 30 tablet 3   famotidine (PEPCID) 20 MG tablet Take 20 mg by mouth daily.     FLUoxetine (PROZAC) 20 MG capsule Take 1 capsule (20 mg total) by mouth daily. (Patient not taking: Reported on 05/31/2023) 30 capsule 0   FLUoxetine (PROZAC) 40 MG capsule Take 40 mg by mouth daily.     hydrocortisone cream 1 % Apply 1 Application topically 2 (two) times daily.     hydrOXYzine (ATARAX) 10 MG tablet Take 1 tablet (10 mg total) by mouth 3 (three) times daily as needed for anxiety. (Patient not taking: Reported on 05/31/2023) 30 tablet 0   hydrOXYzine (ATARAX) 25 MG tablet Take 25 mg by mouth 2 (two) times daily.     LANTUS SOLOSTAR 100 UNIT/ML Solostar Pen Inject 5 Units into the skin daily. (Patient taking differently: Inject 10 Units into the skin daily.) 15 mL 11   lidocaine (LIDODERM) 5 % Place 1 patch onto the skin daily. Remove & Discard patch within 12 hours or as directed by MD 30 patch 0   losartan (COZAAR) 25 MG tablet Take 25 mg by mouth daily.     mesalamine (CANASA) 1000 MG suppository Place 1 suppository (1,000 mg total) rectally 2 (two) times  daily. (Patient not taking: Reported on 05/31/2023) 30 suppository 12   ondansetron (ZOFRAN) 8 MG tablet Take 1 tablet (  8 mg total) by mouth every 8 (eight) hours as needed for nausea or vomiting. (Patient not taking: Reported on 05/31/2023) 30 tablet 0   ONE TOUCH LANCETS MISC USE TO CHECK BLOOD SUGAR TWICE A DAY AND PRN 100 each 6   OXYGEN Inhale 2 L/min into the lungs as needed. (Patient not taking: Reported on 05/31/2023)     pantoprazole (PROTONIX) 40 MG tablet Take 1 tablet (40 mg total) by mouth daily. 30 tablet 0   senna-docusate (SENOKOT-S) 8.6-50 MG tablet Take 1 tablet by mouth daily as needed for mild constipation. (Patient not taking: Reported on 05/31/2023)     traMADol HCl 25 MG TABS Take 50 mg by mouth every 6 (six) hours as needed (for moderate pain).     venetoclax (VENCLEXTA) 10 & 50 & 100 MG Starter Pack Take by mouth daily starting on 04/21/23. Take 20 mg daily for 7 days, then 50 mg daily x 7d, then 100 mg daily x 7d, then 200 mg daily x 7d. Take with food. (Patient not taking: Reported on 05/31/2023) 42 tablet 0   venetoclax (VENCLEXTA) 100 MG tablet Take 2 tablets (200 mg total) by mouth daily. Tablets should be swallowed whole with a meal and a full glass of water. 60 tablet 0   No current facility-administered medications for this visit.    REVIEW OF SYSTEMS:    10 Point review of Systems was done is negative except as noted above.   PHYSICAL EXAMINATION: ECOG PERFORMANCE STATUS: 0 - Asymptomatic Vitals:   07/26/23 0913  BP: (!) 179/100  Pulse: 82  Resp: 18  Temp: (!) 97.4 F (36.3 C)  SpO2: 99%   GENERAL:alert, in no acute distress and comfortable SKIN: no acute rashes, no significant lesions EYES: conjunctiva are pink and non-injected, sclera anicteric OROPHARYNX: MMM, no exudates, no oropharyngeal erythema or ulceration NECK: supple, no JVD LYMPH:  no palpable lymphadenopathy in the cervical, axillary or inguinal regions LUNGS: clear to auscultation b/l with  normal respiratory effort HEART: regular rate & rhythm ABDOMEN:  normoactive bowel sounds , non tender, not distended. Extremity: no pedal edema PSYCH: alert & oriented x 3 with fluent speech NEURO: no focal motor/sensory deficits   LABORATORY DATA:  I have reviewed the data as listed  .    Latest Ref Rng & Units 07/26/2023    8:51 AM 05/29/2023    9:43 AM 03/28/2023   12:44 AM  CBC  WBC 4.0 - 10.5 K/uL 5.9  3.3  8.2   Hemoglobin 12.0 - 15.0 g/dL 82.9  56.2  13.0   Hematocrit 36.0 - 46.0 % 36.9  34.9  35.2   Platelets 150 - 400 K/uL 171  186  159     .    Latest Ref Rng & Units 07/26/2023    8:51 AM 05/29/2023    9:43 AM 03/28/2023    2:40 AM  CMP  Glucose 70 - 99 mg/dL 865  784    BUN 8 - 23 mg/dL 35  33    Creatinine 6.96 - 1.00 mg/dL 2.95  2.84    Sodium 132 - 145 mmol/L 139  138    Potassium 3.5 - 5.1 mmol/L 4.5  4.1    Chloride 98 - 111 mmol/L 108  107    CO2 22 - 32 mmol/L 27  27    Calcium 8.9 - 10.3 mg/dL 44.0  10.2    Total Protein 6.5 - 8.1 g/dL 7.6  7.4  6.8  Total Bilirubin 0.0 - 1.2 mg/dL 0.4  0.4  0.6   Alkaline Phos 38 - 126 U/L 104  90  92   AST 15 - 41 U/L 26  17  20    ALT 0 - 44 U/L 30  22  23     . Lab Results  Component Value Date   LDH 228 (H) 03/13/2023   Alpha-Thalassemia Comment:  Comment: (NOTE) Test: Alpha-Thalassemia, DNA Analysis Result:     Alpha-+-thalassemia trait, alpha alpha/alpha- Mutation(s) identified: alpha3.7    07/16/2019 FISH-CLL Prognostic Panel:         RADIOGRAPHIC STUDIES: I have personally reviewed the radiological images as listed and agreed with the findings in the report. No results found.  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 No consitutional symptoms.  05/31/2019 Mammogram ([id number]) revealed "Further evaluation is suggested for prominent lymph  nodes in the bilateral axilla." 06/26/2019 Flow Pathology ([id number]) revealed "Monoclonal B-cell population identified." 06/26/2019 Right Axilla LN Bx ([id number]) revealed "NON-HODGKIN B-CELL LYMPHOMA"  07/16/2019 FISH/CLL Prognostic Panel ([id number]) revealed "Trisomy 12 (+12) is present."  10/15/2019 CT Soft Tissue Neck ([id number]) revealed bilateral cervical lymphadenopathy. 10/15/2019 CT C/A/P ([id number]) ([id number]) revealed several small lymph nodes in axillary, retroperitoneal, and pelvic regions. Incidentally noted complex renal cyst.   2) RBC microcytosis without Anemia and normal RDW and relative erythrocytosis -- due to alpha thal trait Alpha-Thalassemia Comment:  Comment: (NOTE) Test: Alpha-Thalassemia, DNA Analysis Result:     Alpha-+-thalassemia trait, alpha alpha/alpha- Mutation(s) identified: alpha3.7    3) nonmelanoma skin cancer on lower extremities. -Continue follow-up with dermatology for evaluation and management. -I discussed with the patient that her non-Hodgkin's lymphoma/SLL could be a risk factor for recurrent nonmelanoma skin cancers.  4) . Patient Active Problem List   Diagnosis Date Noted   Protein-calorie malnutrition, severe 10/08/2022   Severe sepsis (HCC) 10/05/2022   Dehydration 10/05/2022   Acute prerenal azotemia 10/05/2022   Proctitis 10/05/2022   Hypercalcemia 10/05/2022   Transaminitis 10/05/2022   Pyuria 10/05/2022   Depression 10/05/2022   CAP (community acquired pneumonia) 10/04/2022   Malnutrition of moderate degree 09/26/2022   SOB (shortness of breath) 09/25/2022   Nausea vomiting and diarrhea 09/24/2022   Shortness of breath 09/24/2022   Acute respiratory failure with hypoxia (HCC) 09/24/2022   LFT elevation 09/24/2022   Diabetes mellitus (HCC) 09/06/2022   Rectal ulcer 07/09/2022   Colon ulcer 07/08/2022   Rectal bleeding 07/07/2022   Anemia 07/07/2022   GI bleed 07/06/2022   Dysarthria 06/12/2022   Aspiration  pneumonia (HCC) 06/12/2022   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   Anxiety disorder due to medical condition 06/10/2022   AKI (acute kidney injury) (HCC) 06/01/2022   Dizziness 05/29/2022   Personal history of CLL (chronic lymphocytic leukemia) 05/29/2022   Rectal pain 05/29/2022   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 disease, with long-term current use of insulin (HCC) 04/14/2022   Type 2 diabetes mellitus with diabetic polyneuropathy, with long-term current use of insulin (HCC) 04/14/2022   Antiplatelet or antithrombotic long-term use 03/16/2022   HLD (hyperlipidemia) 10/01/2021   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HCC) 09/17/2021   Stage 3a chronic kidney disease (CKD) (HCC) 09/16/2021   Thyroid nodule 09/16/2021   Cervical lymphadenopathy 09/16/2021   Acute CVA (cerebrovascular accident) (HCC) 09/15/2021   GERD (gastroesophageal reflux disease) 04/30/2021   Peripheral arterial disease (  HCC) 04/13/2021   Atherosclerosis of aorta (HCC) 02/14/2021   Cerebral thrombosis with cerebral infarction 02/08/2021   Abnormal MRI of head 02/07/2021   Vertigo 02/07/2021   Class 1 obesity due to excess calories with body mass index (BMI) of 31.0 to 31.9 in adult 02/07/2021   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   Trigger finger, left ring finger 10/04/2018   Alternating constipation and diarrhea 04/20/2018   Diabetic peripheral neuropathy associated with type 2 diabetes mellitus (HCC) 02/26/2018   DM2 (diabetes mellitus, type 2) (HCC) 07/29/2015   ABNORMALITY OF GAIT 05/10/2010   ECZEMA, ATOPIC 04/03/2009   Hyperlipidemia associated with type 2 diabetes mellitus (HCC) 03/06/2009   BACK PAIN 11/14/2008   Hemiparesis affecting left side as late effect of stroke (HCC) 08/05/2008   DIPLOPIA 07/15/2008   Essential hypertension 07/15/2008   PLAN:  -Discussed lab results on 07/26/2023 in detail with patient. CBC showed WBC of 5.9K, hemoglobin of 12.4, and platelets of  171K. -previous mild anemia has resolved at this time -Total WBC normal -platelets normal -cmp stable -her lymphoma is not bothersome from a blood count standpoint -did not feel any significantly enlarged lymph nodes during physical examination -Patient was noted to have started venetoclax at the end of December  -patient has been tolerating venetoclax with no new or significant toxicity issues -continue venetoclax at 200 MG daily -will plan to repeat scan in June prior to her next visit to evaluate for any chages in the internal lymph nodes. -will plan for PET scan if insurance allows, or otherwise plain CT scan -discussed that if she is responding to treatment, then we shall continue -discussed that if there is a role to adjust venetoclax dose based on scan response, we will have discussion at that time.  -educated patient that eye sight can vary if the eyes are dry -discussed option of connecting with an eye doctor for consideration of artifical tear drops -answered all of patient's and her family member's questions in detail -patient shall return to clinic in 3 months  FOLLOW-UP: Pet/ct in 12 weeks RTC with Dr Candise Che with labs in 14 weeks  The total time spent in the appointment was 25 minutes* .  All of the patient's questions were answered with apparent satisfaction. The patient knows to call the clinic with any problems, questions or concerns.   Wyvonnia Lora MD MS AAHIVMS [facility] Hematology/Oncology Physician [facility]  .*Total Encounter Time as defined by the Centers for Medicare and Medicaid Services includes, in addition to the face-to-face time of a patient visit (documented in the note above) non-face-to-face time: obtaining and reviewing outside history, ordering and reviewing medications, tests or procedures, care coordination (communications with other health care professionals or caregivers) and documentation in the medical record.    I,Mitra  Faeizi,acting as a Neurosurgeon for Wyvonnia Lora, MD.,have documented all relevant documentation on the behalf of Wyvonnia Lora, MD,as directed by  Wyvonnia Lora, MD while in the presence of Wyvonnia Lora, MD.  .I have reviewed the above documentation for accuracy and completeness, and I agree with the above. Johney Maine MD

## 2023-07-26 ENCOUNTER — Inpatient Hospital Stay (HOSPITAL_BASED_OUTPATIENT_CLINIC_OR_DEPARTMENT_OTHER): Payer: Commercial Managed Care - HMO | Admitting: Hematology

## 2023-07-26 ENCOUNTER — Inpatient Hospital Stay: Payer: Commercial Managed Care - HMO | Attending: Hematology

## 2023-07-26 VITALS — BP 179/100 | HR 82 | Temp 97.4°F | Resp 18 | Wt 158.0 lb

## 2023-07-26 DIAGNOSIS — C9110 Chronic lymphocytic leukemia of B-cell type not having achieved remission: Secondary | ICD-10-CM | POA: Insufficient documentation

## 2023-07-26 DIAGNOSIS — C8300 Small cell B-cell lymphoma, unspecified site: Secondary | ICD-10-CM

## 2023-07-26 LAB — CBC WITH DIFFERENTIAL (CANCER CENTER ONLY)
Abs Immature Granulocytes: 0.02 10*3/uL (ref 0.00–0.07)
Basophils Absolute: 0 10*3/uL (ref 0.0–0.1)
Basophils Relative: 0 %
Eosinophils Absolute: 1.1 10*3/uL — ABNORMAL HIGH (ref 0.0–0.5)
Eosinophils Relative: 19 %
HCT: 36.9 % (ref 36.0–46.0)
Hemoglobin: 12.4 g/dL (ref 12.0–15.0)
Immature Granulocytes: 0 %
Lymphocytes Relative: 20 %
Lymphs Abs: 1.2 10*3/uL (ref 0.7–4.0)
MCH: 26.5 pg (ref 26.0–34.0)
MCHC: 33.6 g/dL (ref 30.0–36.0)
MCV: 78.8 fL — ABNORMAL LOW (ref 80.0–100.0)
Monocytes Absolute: 0.6 10*3/uL (ref 0.1–1.0)
Monocytes Relative: 10 %
Neutro Abs: 3 10*3/uL (ref 1.7–7.7)
Neutrophils Relative %: 51 %
Platelet Count: 171 10*3/uL (ref 150–400)
RBC: 4.68 MIL/uL (ref 3.87–5.11)
RDW: 14.1 % (ref 11.5–15.5)
WBC Count: 5.9 10*3/uL (ref 4.0–10.5)
nRBC: 0 % (ref 0.0–0.2)

## 2023-07-26 LAB — CMP (CANCER CENTER ONLY)
ALT: 30 U/L (ref 0–44)
AST: 26 U/L (ref 15–41)
Albumin: 3.4 g/dL — ABNORMAL LOW (ref 3.5–5.0)
Alkaline Phosphatase: 104 U/L (ref 38–126)
Anion gap: 4 — ABNORMAL LOW (ref 5–15)
BUN: 35 mg/dL — ABNORMAL HIGH (ref 8–23)
CO2: 27 mmol/L (ref 22–32)
Calcium: 10.1 mg/dL (ref 8.9–10.3)
Chloride: 108 mmol/L (ref 98–111)
Creatinine: 0.89 mg/dL (ref 0.44–1.00)
GFR, Estimated: >60 mL/min (ref >60)
Glucose, Bld: 212 mg/dL — ABNORMAL HIGH (ref 70–99)
Potassium: 4.5 mmol/L (ref 3.5–5.1)
Sodium: 139 mmol/L (ref 135–145)
Total Bilirubin: 0.4 mg/dL (ref 0.0–1.2)
Total Protein: 7.6 g/dL (ref 6.5–8.1)

## 2023-07-26 LAB — PHOSPHORUS: Phosphorus: 3.8 mg/dL (ref 2.5–4.6)

## 2023-07-26 LAB — URIC ACID: Uric Acid, Serum: 3.8 mg/dL (ref 2.5–7.1)

## 2023-07-28 ENCOUNTER — Telehealth: Payer: Self-pay | Admitting: Hematology

## 2023-07-28 NOTE — Telephone Encounter (Signed)
 Spoke with nurse at [facility] facility to confirm patient

## 2023-08-08 ENCOUNTER — Other Ambulatory Visit (HOSPITAL_COMMUNITY): Payer: Self-pay

## 2023-08-08 ENCOUNTER — Other Ambulatory Visit: Payer: Self-pay

## 2023-08-08 NOTE — Progress Notes (Signed)
 Specialty Pharmacy Ongoing Clinical Assessment Note  [name] is a 63 y.o. female who is being followed by the specialty pharmacy service for RxSp Oncology   Patient's specialty medication(s) reviewed today: Venetoclax (VENCLEXTA)   Missed doses in the last 4 weeks: 0   Patient/Caregiver did not have any additional questions or concerns.   Therapeutic benefit summary: Patient is achieving benefit   Adverse events/side effects summary: No adverse events/side effects   Patient's therapy is appropriate to: Continue    Goals Addressed             This Visit's Progress    Maintain optimal adherence to therapy       Patient is on track. Patient will maintain adherence          Follow up:  3 months  Otto Herb Specialty Pharmacist

## 2023-08-10 ENCOUNTER — Other Ambulatory Visit: Payer: Self-pay | Admitting: Otolaryngology

## 2023-08-10 DIAGNOSIS — E041 Nontoxic single thyroid nodule: Secondary | ICD-10-CM

## 2023-08-23 ENCOUNTER — Other Ambulatory Visit (HOSPITAL_COMMUNITY): Payer: Self-pay

## 2023-08-23 ENCOUNTER — Other Ambulatory Visit: Payer: Self-pay | Admitting: Hematology

## 2023-08-23 ENCOUNTER — Other Ambulatory Visit: Payer: Self-pay

## 2023-08-23 ENCOUNTER — Other Ambulatory Visit: Payer: Self-pay | Admitting: Pharmacy Technician

## 2023-08-23 MED ORDER — VENETOCLAX 100 MG PO TABS
200.0000 mg | ORAL_TABLET | Freq: Every day | ORAL | 0 refills | Status: DC
  Filled 2023-08-24: qty 56, 28d supply, fill #0

## 2023-08-23 NOTE — Progress Notes (Signed)
 Specialty Pharmacy Refill Coordination Note  [name] is a 63 y.o. female contacted today regarding refills of specialty medication(s) Venetoclax  (VENCLEXTA )  [facility] with Tiffany at [facility]  Patient requested Delivery   Delivery date: 08/25/23   Verified address: [address] Attn [name], [place]   Medication will be filled on 08/24/23 or as soon as refill approved.   RR sent to MD

## 2023-08-24 ENCOUNTER — Other Ambulatory Visit: Payer: Self-pay

## 2023-08-24 ENCOUNTER — Other Ambulatory Visit (HOSPITAL_COMMUNITY): Payer: Self-pay

## 2023-09-12 ENCOUNTER — Other Ambulatory Visit: Payer: Self-pay | Admitting: Hematology

## 2023-09-12 ENCOUNTER — Other Ambulatory Visit: Payer: Self-pay

## 2023-09-12 ENCOUNTER — Other Ambulatory Visit (HOSPITAL_COMMUNITY): Payer: Self-pay

## 2023-09-12 ENCOUNTER — Other Ambulatory Visit: Payer: Self-pay | Admitting: Pharmacy Technician

## 2023-09-12 MED ORDER — VENETOCLAX 100 MG PO TABS
200.0000 mg | ORAL_TABLET | Freq: Every day | ORAL | 0 refills | Status: DC
  Filled 2023-09-12 (×2): qty 56, 28d supply, fill #0

## 2023-09-12 NOTE — Progress Notes (Signed)
 Specialty Pharmacy Refill Coordination Note  [name] is a 63 y.o. female contacted today regarding refills of specialty medication(s) Venetoclax  (VENCLEXTA )  Spoke with Nurse Archie Bearded  Patient requested Delivery   Delivery date: 09/20/23   Verified address: [address] ATTN: [name]   Medication will be filled on 09/19/23.   This fill date is pending response to refill request from provider. Patient is aware and if they have not received fill by intended date they must follow up with pharmacy.

## 2023-09-13 ENCOUNTER — Ambulatory Visit

## 2023-09-13 NOTE — Therapy (Deleted)
 OUTPATIENT SPEECH LANGUAGE PATHOLOGY SWALLOW EVALUATION   Patient Name: [name] MRN: [id number] DOB:1960/09/02, 63 y.o., female Today's Date: 09/13/2023  PCP: [place], [name] MD REFERRING PROVIDER: Daleen Dubs, DO  END OF SESSION:   Past Medical History:  Diagnosis Date   Abnormality of gait 05/10/2010   BACK PAIN 11/14/2008   Class 1 obesity due to excess calories with body mass index (BMI) of 31.0 to 31.9 in adult 02/07/2021   DIABETES MELLITUS, TYPE II 07/15/2008   Diplopia 07/15/2008   ECZEMA, ATOPIC 04/03/2009   GERD (gastroesophageal reflux disease)    Guillain-Barre (HCC) 1988   HYPERLIPIDEMIA 03/06/2009   HYPERTENSION 07/15/2008   Stroke (HCC) 2010, 2011   x2    Vertebral artery stenosis    Past Surgical History:  Procedure Laterality Date   ABDOMINAL HYSTERECTOMY     BIOPSY  07/08/2022   Procedure: BIOPSY;  Surgeon: Lisa Holder, MD;  Location: [facility] ENDOSCOPY;  Service: Gastroenterology;;   BIOPSY  10/12/2022   Procedure: BIOPSY;  Surgeon: Lisa Kinder, DO;  Location: [facility] ENDOSCOPY;  Service: Gastroenterology;;   COLONOSCOPY WITH PROPOFOL  N/A 07/08/2022   Procedure: COLONOSCOPY WITH PROPOFOL ;  Surgeon: Lisa Holder, MD;  Location: [facility] ENDOSCOPY;  Service: Gastroenterology;  Laterality: N/A;   DILATION AND CURETTAGE OF UTERUS     FLEXIBLE SIGMOIDOSCOPY N/A 10/12/2022   Procedure: FLEXIBLE SIGMOIDOSCOPY;  Surgeon: Lisa Kinder, DO;  Location: [facility] ENDOSCOPY;  Service: Gastroenterology;  Laterality: N/A;   FOOT SURGERY     IR ANGIO INTRA EXTRACRAN SEL COM CAROTID INNOMINATE UNI L MOD SED  09/17/2021   IR ANGIO INTRA EXTRACRAN SEL INTERNAL CAROTID UNI R MOD SED  09/17/2021   IR ANGIO VERTEBRAL SEL VERTEBRAL UNI R MOD SED  09/17/2021   IR US  GUIDE VASC ACCESS RIGHT  09/17/2021   Patient Active Problem List   Diagnosis Date Noted   Protein-calorie malnutrition, severe 10/08/2022   Severe sepsis (HCC) 10/05/2022   Dehydration 10/05/2022    Acute prerenal azotemia 10/05/2022   Proctitis 10/05/2022   Hypercalcemia 10/05/2022   Transaminitis 10/05/2022   Pyuria 10/05/2022   Depression 10/05/2022   CAP (community acquired pneumonia) 10/04/2022   Malnutrition of moderate degree 09/26/2022   SOB (shortness of breath) 09/25/2022   Nausea vomiting and diarrhea 09/24/2022   Shortness of breath 09/24/2022   Acute respiratory failure with hypoxia (HCC) 09/24/2022   LFT elevation 09/24/2022   Diabetes mellitus (HCC) 09/06/2022   Rectal ulcer 07/09/2022   Colon ulcer 07/08/2022   Rectal bleeding 07/07/2022   Anemia 07/07/2022   GI bleed 07/06/2022   Dysarthria 06/12/2022   Aspiration pneumonia (HCC) 06/12/2022   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   Anxiety disorder due to medical condition 06/10/2022   AKI (acute kidney injury) (HCC) 06/01/2022   Dizziness 05/29/2022   Personal history of CLL (chronic lymphocytic leukemia) 05/29/2022   Rectal pain 05/29/2022   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 disease, with long-term current use of insulin  (HCC) 04/14/2022   Type 2 diabetes mellitus with diabetic polyneuropathy, with long-term current use of insulin  (HCC) 04/14/2022   Antiplatelet or antithrombotic long-term use 03/16/2022   HLD (hyperlipidemia) 10/01/2021   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HCC) 09/17/2021   Stage 3a chronic kidney disease (CKD) (HCC) 09/16/2021   Thyroid  nodule 09/16/2021   Cervical lymphadenopathy 09/16/2021   Acute CVA (cerebrovascular accident) (HCC) 09/15/2021   GERD (gastroesophageal reflux disease) 04/30/2021   Peripheral arterial disease (HCC) 04/13/2021  Atherosclerosis of aorta (HCC) 02/14/2021   Cerebral thrombosis with cerebral infarction 02/08/2021   Abnormal MRI of head 02/07/2021   Vertigo 02/07/2021   Class 1 obesity due to excess calories with body mass index (BMI) of 31.0 to 31.9 in adult 02/07/2021   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   Trigger finger,  left ring finger 10/04/2018   Alternating constipation and diarrhea 04/20/2018   Diabetic peripheral neuropathy associated with type 2 diabetes mellitus (HCC) 02/26/2018   DM2 (diabetes mellitus, type 2) (HCC) 07/29/2015   ABNORMALITY OF GAIT 05/10/2010   ECZEMA, ATOPIC 04/03/2009   Hyperlipidemia associated with type 2 diabetes mellitus (HCC) 03/06/2009   BACK PAIN 11/14/2008   Hemiparesis affecting left side as late effect of stroke (HCC) 08/05/2008   DIPLOPIA 07/15/2008   Essential hypertension 07/15/2008    ONSET DATE: 08/11/2023 (referral date)   REFERRING DIAG: R13.10 (ICD-10-CM) - Dysphagia, unspecified  THERAPY DIAG:  No diagnosis found.  Rationale for Evaluation and Treatment: Rehabilitation  SUBJECTIVE:   SUBJECTIVE STATEMENT: *** Pt accompanied by: {accompnied:27141}  PERTINENT HISTORY: "[name] is a 63 y.o. female with history of small cell lymphoma, CVA presenting for evaluation of left sided neck pain, tinnitus, and intermittent dysphagia.The neck pain is likely due to musculoskeletal tension, possibly exacerbated by positional preferences following her stroke. There are no concerning findings beyond musculoskeletal tension. She is advised to inform her physical therapist about the discomfort to focus on stretching and muscle relaxation techniques. A referral to speech therapy will be made to address potential swallowing issues and provide additional neck exercises. I recommended obtaining an audiogram at her convenience for further evaluation of her subjective tinnitus."   PAIN:  Are you having pain? {OPRCPAIN:27236}  FALLS: Has patient fallen in last 6 months?  {UJWJXBJY:78295}  LIVING ENVIRONMENT: Lives with: {OPRC lives with:25569::"lives with their family"} Lives in: {Lives in:25570}  PLOF:  Level of assistance: {AOZHYQM:57846} Employment: {SLPemployment:25674}  PATIENT GOALS: ***  OBJECTIVE:  Note: Objective measures were completed at  Evaluation unless otherwise noted. OBJECTIVE:   DIAGNOSTIC FINDINGS: ***  INSTRUMENTAL SWALLOW STUDY FINDINGS ({mbss fees:29767}) *** Objective swallow impairments: *** Objective recommended compensations: ***  COGNITION: Overall cognitive status: {cognition:24006} Areas of impairment:  {cognitiveimpairmentslp:27409} Functional deficits: ***  SUBJECTIVE DYSPHAGIA REPORTS:  Date of onset: *** Reported symptoms: {dysphagia symptoms:29766}  Current diet: {slpdiet:27196}  Co-morbid voice changes: {yes/no:20286}  FACTORS WHICH MAY INCREASE RISK OF ADVERSE EVENT IN PRESENCE OF ASPIRATION:  General health: {CSE general health:29764}  Risk factors: {CSE risk factors:29763}    ORAL MOTOR EXAMINATION: Overall status: {OMESLP2:27645} Comments: ***  CLINICAL SWALLOW ASSESSMENT:   Dentition: {CSE dentition:29769} Vocal quality at baseline: {VQL:27192} Patient directly observed with POs: {POobserved:27199} Feeding: {slp feeding:27200} Liquids provided by: {SLPliquids:27201} [facility] Swallow Protocol: {Pass/Fail (Optional):210140017} Oral phase signs and symptoms: {SLPoralphase:27202} Pharyngeal phase signs and symptoms: {SLPpharyngealphase:27203}  PATIENT REPORTED OUTCOME MEASURES (PROM): {SLPPROM:27095}  TREATMENT DATE: ***   PATIENT EDUCATION: Education details: *** Person educated: {Person educated:25204} Education method: {Education Method:25205} Education comprehension: {Education Comprehension:25206}   ASSESSMENT:  CLINICAL IMPRESSION: Patient is a *** y.o. *** who was seen today for ***.   OBJECTIVE IMPAIRMENTS: include {SLPOBJIMP:27107}. These impairments are limiting patient from {SLPLIMIT:27108}. Factors affecting potential to achieve goals and functional outcome are {SLP potential:25450}. Patient will benefit from skilled SLP services to  address above impairments and improve overall function.  REHAB POTENTIAL: {rehabpotential:25112}   GOALS: Goals reviewed with patient? {yes/no:20286}  SHORT TERM GOALS: Target date: ***  *** Baseline: Goal status: INITIAL  2.  *** Baseline:  Goal status: INITIAL  3.  *** Baseline:  Goal status: INITIAL  4.  *** Baseline:  Goal status: INITIAL  5.  *** Baseline:  Goal status: INITIAL  6.  *** Baseline:  Goal status: INITIAL  LONG TERM GOALS: Target date: ***  *** Baseline:  Goal status: INITIAL  2.  *** Baseline:  Goal status: INITIAL  3.  *** Baseline:  Goal status: INITIAL  4.  *** Baseline:  Goal status: INITIAL  5.  *** Baseline:  Goal status: INITIAL  6.  *** Baseline:  Goal status: INITIAL  PLAN:  SLP FREQUENCY: {rehab frequency:25116}  SLP DURATION: {rehab duration:25117}  PLANNED INTERVENTIONS: {SLP treatment/interventions:25449}    Tamar Fairly, CCC-SLP 09/13/2023, 7:48 AM

## 2023-09-19 ENCOUNTER — Other Ambulatory Visit (HOSPITAL_COMMUNITY): Payer: Self-pay

## 2023-09-19 ENCOUNTER — Other Ambulatory Visit: Payer: Self-pay

## 2023-10-10 ENCOUNTER — Ambulatory Visit (INDEPENDENT_AMBULATORY_CARE_PROVIDER_SITE_OTHER): Payer: Commercial Managed Care - HMO | Admitting: Neurology

## 2023-10-10 ENCOUNTER — Encounter: Payer: Self-pay | Admitting: Neurology

## 2023-10-10 VITALS — BP 156/90 | HR 72 | Ht 64.0 in | Wt 160.0 lb

## 2023-10-10 DIAGNOSIS — I69398 Other sequelae of cerebral infarction: Secondary | ICD-10-CM

## 2023-10-10 DIAGNOSIS — I679 Cerebrovascular disease, unspecified: Secondary | ICD-10-CM | POA: Diagnosis not present

## 2023-10-10 DIAGNOSIS — I639 Cerebral infarction, unspecified: Secondary | ICD-10-CM | POA: Diagnosis not present

## 2023-10-10 DIAGNOSIS — R252 Cramp and spasm: Secondary | ICD-10-CM | POA: Diagnosis not present

## 2023-10-10 MED ORDER — BACLOFEN 10 MG PO TABS: 15.0000 mg | ORAL_TABLET | Freq: Three times a day (TID) | ORAL | 5 refills | Status: DC

## 2023-10-10 NOTE — Patient Instructions (Signed)
 Increase baclofen  to 15mg  three times daily (take 1.5 tablets three times daily).

## 2023-10-10 NOTE — Progress Notes (Signed)
 Follow-up Visit   Date: 10/10/23   [name] MRN: [id number] DOB: 1960-08-07   Interim History: [name] is a 63 y.o. 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  She has history of prior stroke in 2010, pontine stroke (01/2021) and bilateral cerebellar stroke (08/2021).  The patient was accompanied to the clinic by caregiver.   IMPRESSION/PLAN: Spacticity involving the left arm/leg neck, and speech as a late effect of her multiple strokes  - Increase baclofen  to 15mg  three times daily  - Continue PT/OT/Speech therapy at [facility]  2.  Cerebrovascular disease with severe intracranial stenosis continues to make future risk of stroke high.  She has severe functional debility and has quadraparesis, worse on the left side, dysphagia, and spastic dysarthria.  She has history of multiple strokes (2010, pontine stroke in 2022, cerebellar stroke 2023, and bilateral medullary stroke and left cerebellar stroke).  She has been on a number of antiplatelet regiments (aspirin  + plavix , plavix  + pletal , and aspirin  + Brilinta ).  She has completed 3 months of dual antiplatelet therapy with aspirin  + Brilanta, and on monotherapy with aspirin  81mg  daily.  Management remains medical therapy.  She is on aspirin  81mg . Cholesterol (LDL) and diabetes (HbA1c 6.7) is well-controlled.   Return to clinic in 9 months  -------------------------------------------------- UPDATE 10/04/2022:  She was hospitalized in January for right medullary stroke presenting with gait change, diplopia, and dizziness.  She was discharged on plavix  + pletal .  In February, she was readmitted with slurred speech, shortness of breath, and worsening right side weakness with imaging  showing extension of the right medullary stroke to the left side.    She was switched to aspirin  + Brilinta  and recommended to continue this for 3 months.   Since her hospitalization, her diabetes is much better managed and HbA1c 11.4 > 6.7 and LDL is also improved from 409 > 81.  She has been as SNF and getting PT/OT.  She has noticed that her hand strength is starting to improve. She is antigravity in the right arm and had some movement in the left hand.  She was able to stand with assistance, which is improved.   She complains of odd sensation and tight sensation over the neck since her last stroke.   UPDATE 12/13/2022:  She reports having tight sensation in her throat and across her neck.  Her speech has become more strained and soft.  She cannot speak loudly.  She has better strength in the right arm and is able to hold onto objects.  She is able to raise the left arm, but has no movement in the hand and now fingers are clawing him.  She is nonambulatory and wheelchair-bound.  She was getting PT/OT but due to insurance limitations, she is no longer getting this.   UPDATE 02/14/2023:  She was in the ER in September with headache, chest pain, and neck pain.  MRI/A brain showed interval new small right cerebellar infarct, chronic medullary encephalomalcia, and loss of most posterior circulation flow with minimal flow in the basilar, SCA, and PCAs.  Additionally, there is moderate to severe anterior circulation stenosis. Active lymphoma was seen on imaging.  She is hoping for her strength to return in the left arm and legs.  She is well-chair dependent and nonambulatory.  Her right leg is  moving less and she has minimal movement of the left arm and leg.  She continues to have tight neck pain.    UPDATE 10/10/2022:  She is here for follow-up visit. She remains debilitated from her multiple strokes and is dependent on all ADLs, except feeding.  She is nonabulatory.  Fortunately, she had not been hospitalized in  the past 6 months.  She continues to have tightness involving the left neck, arm, and leg.  She takes baclofen  10mg  TID and denies any improvement or sleepiness.    Medications:  Current Outpatient Medications on File Prior to Visit  Medication Sig Dispense Refill   acetaminophen  (TYLENOL ) 325 MG tablet Take 650 mg by mouth every 4 (four) hours as needed for mild pain (pain score 1-3) or fever. Do not exceed 3000mg  in 24 hours     amLODipine  (NORVASC ) 10 MG tablet Take 1 tablet by mouth once daily 90 tablet 1   antiseptic oral rinse (BIOTENE) LIQD 15 mLs by Mouth Rinse route 3 (three) times daily.     aspirin  EC 81 MG tablet Take 1 tablet (81 mg total) by mouth daily. Swallow whole. 30 tablet 12   atorvastatin  (LIPITOR ) 80 MG tablet Take 1 tablet (80 mg total) by mouth daily. 30 tablet 3   baclofen  (LIORESAL ) 10 MG tablet Take 5mg  twice daily x 1 week, then increase 10mg  twice daily.  Stay on lower dose if patient gets too sleepy. (Patient taking differently: Take 10 mg by mouth 2 (two) times daily.) 60 each 5   busPIRone (BUSPAR) 5 MG tablet Take 5 mg by mouth 3 (three) times daily.     cloNIDine (CATAPRES - DOSED IN MG/24 HR) 0.1 mg/24hr patch 0.1 mg once a week.     ezetimibe  (ZETIA ) 10 MG tablet Take 1 tablet (10 mg total) by mouth daily. 30 tablet 3   famotidine  (PEPCID ) 20 MG tablet Take 20 mg by mouth daily.     FLUoxetine  (PROZAC ) 40 MG capsule Take 40 mg by mouth daily.     hydrochlorothiazide  (HYDRODIURIL ) 12.5 MG tablet Take 12.5 mg by mouth daily.     hydrocortisone  cream 1 % Apply 1 Application topically 2 (two) times daily.     LANTUS  SOLOSTAR 100 UNIT/ML Solostar Pen Inject 5 Units into the skin daily. (Patient taking differently: Inject 10 Units into the skin daily.) 15 mL 11   lidocaine  (LIDODERM ) 5 % Place 1 patch onto the skin daily. Remove & Discard patch within 12 hours or as directed by MD 30 patch 0   losartan  (COZAAR ) 25 MG tablet Take 25 mg by mouth daily.     ONE TOUCH  LANCETS MISC USE TO CHECK BLOOD SUGAR TWICE A DAY AND PRN 100 each 6   pantoprazole  (PROTONIX ) 40 MG tablet Take 1 tablet (40 mg total) by mouth daily. 30 tablet 0   traMADol  HCl 25 MG TABS Take 50 mg by mouth every 6 (six) hours as needed (for moderate pain).     venetoclax  (VENCLEXTA ) 100 MG tablet Take 2 tablets (200 mg total) by mouth daily. Tablets should be swallowed whole with a meal and a full glass of water. 56 tablet 0   allopurinol  (ZYLOPRIM ) 100 MG tablet Take 1 tablet (100 mg total) by mouth 2 (two) times daily. (Patient not taking: Reported on 10/10/2023) 60 tablet 0   mesalamine  (CANASA ) 1000 MG suppository Place 1 suppository (1,000 mg total) rectally 2 (two) times daily. (Patient not taking: Reported on 05/31/2023) 30 suppository  12   ondansetron  (ZOFRAN ) 8 MG tablet Take 1 tablet (8 mg total) by mouth every 8 (eight) hours as needed for nausea or vomiting. (Patient not taking: Reported on 05/31/2023) 30 tablet 0   OXYGEN Inhale 2 L/min into the lungs as needed. (Patient not taking: Reported on 05/31/2023)     senna-docusate (SENOKOT-S) 8.6-50 MG tablet Take 1 tablet by mouth daily as needed for mild constipation. (Patient not taking: Reported on 05/31/2023)     No current facility-administered medications on file prior to visit.    Allergies: No Known Allergies  Vital Signs:  BP (!) 156/90   Pulse 72   Ht 5\' 4"  (1.626 m)   Wt 160 lb (72.6 kg)   SpO2 98%   BMI 27.46 kg/m   Neurological Exam: MENTAL STATUS including orientation to time, place, person, recent and remote memory, attention span and concentration, language, and fund of knowledge is normal.  Speech is midly dysarthric, soft, and moderately spastic.  CRANIAL NERVES:   Pupils equal round and reactive to light.  Normal conjugate, extra-ocular eye movements in all directions of gaze.  No ptosis .  Face is symmetric. Palate elevates symmetrically.  Tongue is midline.  MOTOR:  Moderate atrophy of the hands, worse on the  left.  Generalized loss of muscle bulk in the legs, worse on the left.   RUE  5/5 proximally, 4/5 in the hand LUE  3/5 proximally, 2/5 in the hand RLE  2/5 proximally and distally LLE   1/5 throughout  MSRs:  Reflexes are 2+/4 throughout.  SENSORY:  Vibration and temperature intact throughout.  COORDINATION/GAIT:  Gait is not tested, patient in wheelchair  Data: NCS/EMG of the upper extremities 06/14/2018: The electrophysiologic findings are consistent with a sensorimotor polyneuropathy, with axonal and demyelinating features.  Overall, these findings are moderate in degree electrically.  MRI head 01/05/2023:  1. Negative for acute infarct. 2. Expected evolution of medulflary encephalomalacia since February. New small but chronic right cerebellar infarcts since that time. Other brainstem and left cerebellar ischemia appears stable. 3. Progression of generalized cervical lymphadenopathy compatible with Active Lymphoma as on CTA today. 4. Loss of normal distal vertebral and proximal basilar artery flow voids concordant with CTA also. MRA today is reported separately.   MRA head 01/05/2023: 1. Abnormal Posterior Circulation concordant with CTA 0813 hours today: Loss of most posterior circulation flow signal in the setting of chronically occluded distal right vertebral, and interval occluded left vertebral artery V4 segments. Minimal flow detected now in the Basilar, SCAs and PCAs. The left PICA origin remains patent.   2. Stable MRI appearance of the Anterior Circulation since January, see anterior circulation atherosclerosis details on CTA this morning.   3. MRI today reported separately.  MRI brain wo contrast 06/12/2022: 1. Increased size of a recent right medullary infarct which now extends to the left of midline. 2. New punctate acute left cerebellar infarct. 3. Mild chronic small vessel ischemic disease. Chronic cerebellar infarcts.  MRI/A head and neck 05/30/2022: 1.  Small acute infarct in the right aspect of the medulla without hemorrhage or mass effect. 2. Background chronic small-vessel ischemic change with small remote infarcts in the bilateral cerebellar hemispheres, one of which on the left is new since 09/15/2021. 3. Prominent lymph nodes in the upper neck consistent with the history of lymphoma, overall decreased in size since the prior studies from 09/15/2021 but incompletely evaluated.   MRA HEAD   1. Diminutive enhancement of the right V4 segment  with intermittent occlusion, and multifocal severe stenosis/near occlusion of the left V4 segment, similar to the prior CTA. 2. Additional intracranial atherosclerotic disease as detailed above resulting in moderate stenosis of the left cavernous ICA, mild-to-moderate stenosis of the right supraclinoid ICA, severe stenosis of the origin of the left superior M2 division, severe stenosis of the distal right M1 segment, and severe stenosis of the proximal left P1 segment.   MRA NECK 1. 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 2. Moderate stenosis of the origin of the left vertebral artery, not significantly changed. 3. Patent carotid systems bilaterally.    Lab Results  Component Value Date   CHOL 126 06/13/2022   HDL 28 (L) 06/13/2022   LDLCALC 81 06/13/2022   LDLDIRECT 175.9 03/02/2012   TRIG 84 06/13/2022   CHOLHDL 4.5 06/13/2022   Lab Results  Component Value Date   HGBA1C 6.7 (A) 09/06/2022     Thank you for allowing me to participate in patient's care.  If I can answer any additional questions, I would be pleased to do so.    Sincerely,    Merisa Julio K. Lydia Sams, DO

## 2023-10-18 ENCOUNTER — Other Ambulatory Visit: Payer: Self-pay

## 2023-10-18 ENCOUNTER — Other Ambulatory Visit: Payer: Self-pay | Admitting: Hematology

## 2023-10-19 ENCOUNTER — Ambulatory Visit (HOSPITAL_COMMUNITY)
Admission: RE | Admit: 2023-10-19 | Discharge: 2023-10-19 | Disposition: A | Discharge status: Home / Self Care | Source: Ambulatory Visit | Attending: Hematology

## 2023-10-19 DIAGNOSIS — E041 Nontoxic single thyroid nodule: Secondary | ICD-10-CM | POA: Insufficient documentation

## 2023-10-19 DIAGNOSIS — C8300 Small cell B-cell lymphoma, unspecified site: Secondary | ICD-10-CM

## 2023-10-19 LAB — GLUCOSE, CAPILLARY: Glucose-Capillary: 172 mg/dL — ABNORMAL HIGH (ref 70–99)

## 2023-10-19 MED ORDER — FLUDEOXYGLUCOSE F - 18 (FDG) INJECTION
7.9300 | Freq: Once | INTRAVENOUS | Status: AC
  Administered 2023-10-19: 7.93 via INTRAVENOUS

## 2023-10-24 ENCOUNTER — Other Ambulatory Visit: Payer: Self-pay

## 2023-10-24 ENCOUNTER — Other Ambulatory Visit (HOSPITAL_COMMUNITY): Payer: Self-pay

## 2023-10-24 NOTE — Progress Notes (Signed)
 Specialty Pharmacy Ongoing Clinical Assessment Note  Spoke to Tiffany, RN and caregiver at the long-term care facility. [name] is a 63 y.o. female who is being followed by the specialty pharmacy service for RxSp Oncology   Patient's specialty medication(s) reviewed today: Venetoclax  (VENCLEXTA )   Missed doses in the last 4 weeks: 0   Patient/Caregiver did not have any additional questions or concerns.   Therapeutic benefit summary: Patient is achieving benefit   Adverse events/side effects summary: Experienced adverse events/side effects (patient has complained of tightness in her face, RN reports this remains unchanged)   Patient's therapy is appropriate to: Continue    Goals Addressed             This Visit's Progress    Maintain optimal adherence to therapy   On track    Patient is on track. Patient will maintain adherence.  Awaiting recent scan results from 10/19/23 to see if patient is still on track, as she was previously.           Follow up: 3 months  Silvano LOISE [name] Specialty Pharmacist

## 2023-10-31 ENCOUNTER — Inpatient Hospital Stay: Attending: Family Medicine

## 2023-10-31 ENCOUNTER — Inpatient Hospital Stay: Admitting: Hematology

## 2023-12-01 ENCOUNTER — Other Ambulatory Visit: Payer: Self-pay | Admitting: Hematology

## 2023-12-01 ENCOUNTER — Other Ambulatory Visit: Payer: Self-pay

## 2023-12-01 MED ORDER — VENETOCLAX 100 MG PO TABS
200.0000 mg | ORAL_TABLET | Freq: Every day | ORAL | 0 refills | Status: DC
  Filled 2023-12-01 – 2023-12-04 (×2): qty 56, 28d supply, fill #0

## 2023-12-04 ENCOUNTER — Other Ambulatory Visit: Payer: Self-pay

## 2023-12-04 NOTE — Progress Notes (Signed)
 Specialty Pharmacy Refill Coordination Note  [name] is a 63 y.o. female contacted today regarding refills of specialty medication(s) Venetoclax  (VENCLEXTA )  Spoke with Nurse at SNF  Patient requested Delivery   Delivery date: 12/12/23   Verified address: [address] ATTN: [name]   Medication will be filled on 08.11.25.

## 2023-12-06 ENCOUNTER — Inpatient Hospital Stay: Attending: Hematology

## 2023-12-06 ENCOUNTER — Inpatient Hospital Stay (HOSPITAL_BASED_OUTPATIENT_CLINIC_OR_DEPARTMENT_OTHER): Admitting: Hematology

## 2023-12-06 VITALS — BP 137/81 | HR 77 | Temp 97.3°F | Resp 18

## 2023-12-06 DIAGNOSIS — I69354 Hemiplegia and hemiparesis following cerebral infarction affecting left non-dominant side: Secondary | ICD-10-CM | POA: Insufficient documentation

## 2023-12-06 DIAGNOSIS — Z8249 Family history of ischemic heart disease and other diseases of the circulatory system: Secondary | ICD-10-CM | POA: Insufficient documentation

## 2023-12-06 DIAGNOSIS — Z7969 Long term (current) use of other immunomodulators and immunosuppressants: Secondary | ICD-10-CM | POA: Insufficient documentation

## 2023-12-06 DIAGNOSIS — E041 Nontoxic single thyroid nodule: Secondary | ICD-10-CM | POA: Diagnosis not present

## 2023-12-06 DIAGNOSIS — Z800 Family history of malignant neoplasm of digestive organs: Secondary | ICD-10-CM | POA: Insufficient documentation

## 2023-12-06 DIAGNOSIS — Z87440 Personal history of urinary (tract) infections: Secondary | ICD-10-CM | POA: Diagnosis not present

## 2023-12-06 DIAGNOSIS — C8304 Small cell B-cell lymphoma, lymph nodes of axilla and upper limb: Secondary | ICD-10-CM | POA: Diagnosis present

## 2023-12-06 DIAGNOSIS — Z833 Family history of diabetes mellitus: Secondary | ICD-10-CM | POA: Diagnosis not present

## 2023-12-06 DIAGNOSIS — N1832 Chronic kidney disease, stage 3b: Secondary | ICD-10-CM | POA: Insufficient documentation

## 2023-12-06 DIAGNOSIS — Z803 Family history of malignant neoplasm of breast: Secondary | ICD-10-CM | POA: Insufficient documentation

## 2023-12-06 DIAGNOSIS — C9110 Chronic lymphocytic leukemia of B-cell type not having achieved remission: Secondary | ICD-10-CM | POA: Insufficient documentation

## 2023-12-06 DIAGNOSIS — Z7982 Long term (current) use of aspirin: Secondary | ICD-10-CM | POA: Insufficient documentation

## 2023-12-06 DIAGNOSIS — I129 Hypertensive chronic kidney disease with stage 1 through stage 4 chronic kidney disease, or unspecified chronic kidney disease: Secondary | ICD-10-CM | POA: Insufficient documentation

## 2023-12-06 DIAGNOSIS — E1122 Type 2 diabetes mellitus with diabetic chronic kidney disease: Secondary | ICD-10-CM | POA: Insufficient documentation

## 2023-12-06 DIAGNOSIS — Z79899 Other long term (current) drug therapy: Secondary | ICD-10-CM | POA: Insufficient documentation

## 2023-12-06 DIAGNOSIS — Z825 Family history of asthma and other chronic lower respiratory diseases: Secondary | ICD-10-CM | POA: Diagnosis not present

## 2023-12-06 DIAGNOSIS — C8300 Small cell B-cell lymphoma, unspecified site: Secondary | ICD-10-CM

## 2023-12-06 DIAGNOSIS — Z90710 Acquired absence of both cervix and uterus: Secondary | ICD-10-CM | POA: Insufficient documentation

## 2023-12-06 DIAGNOSIS — F32A Depression, unspecified: Secondary | ICD-10-CM | POA: Insufficient documentation

## 2023-12-06 DIAGNOSIS — Z823 Family history of stroke: Secondary | ICD-10-CM | POA: Insufficient documentation

## 2023-12-06 DIAGNOSIS — D560 Alpha thalassemia: Secondary | ICD-10-CM | POA: Diagnosis not present

## 2023-12-06 LAB — CBC WITH DIFFERENTIAL (CANCER CENTER ONLY)
Abs Immature Granulocytes: 0.01 K/uL (ref 0.00–0.07)
Basophils Absolute: 0 K/uL (ref 0.0–0.1)
Basophils Relative: 0 %
Eosinophils Absolute: 0.3 K/uL (ref 0.0–0.5)
Eosinophils Relative: 8 %
HCT: 34.4 % — ABNORMAL LOW (ref 36.0–46.0)
Hemoglobin: 11.4 g/dL — ABNORMAL LOW (ref 12.0–15.0)
Immature Granulocytes: 0 %
Lymphocytes Relative: 26 %
Lymphs Abs: 0.9 K/uL (ref 0.7–4.0)
MCH: 27.2 pg (ref 26.0–34.0)
MCHC: 33.1 g/dL (ref 30.0–36.0)
MCV: 82.1 fL (ref 80.0–100.0)
Monocytes Absolute: 0.3 K/uL (ref 0.1–1.0)
Monocytes Relative: 9 %
Neutro Abs: 2 K/uL (ref 1.7–7.7)
Neutrophils Relative %: 57 %
Platelet Count: 192 K/uL (ref 150–400)
RBC: 4.19 MIL/uL (ref 3.87–5.11)
RDW: 15.2 % (ref 11.5–15.5)
WBC Count: 3.6 K/uL — ABNORMAL LOW (ref 4.0–10.5)
nRBC: 0 % (ref 0.0–0.2)

## 2023-12-06 LAB — CMP (CANCER CENTER ONLY)
ALT: 25 U/L (ref 0–44)
AST: 20 U/L (ref 15–41)
Albumin: 3.5 g/dL (ref 3.5–5.0)
Alkaline Phosphatase: 81 U/L (ref 38–126)
Anion gap: 3 — ABNORMAL LOW (ref 5–15)
BUN: 29 mg/dL — ABNORMAL HIGH (ref 8–23)
CO2: 31 mmol/L (ref 22–32)
Calcium: 10.1 mg/dL (ref 8.9–10.3)
Chloride: 106 mmol/L (ref 98–111)
Creatinine: 0.91 mg/dL (ref 0.44–1.00)
GFR, Estimated: >60 mL/min (ref >60)
Glucose, Bld: 297 mg/dL — ABNORMAL HIGH (ref 70–99)
Potassium: 4.3 mmol/L (ref 3.5–5.1)
Sodium: 140 mmol/L (ref 135–145)
Total Bilirubin: 0.3 mg/dL (ref 0.0–1.2)
Total Protein: 7.4 g/dL (ref 6.5–8.1)

## 2023-12-06 LAB — PHOSPHORUS: Phosphorus: 3.6 mg/dL (ref 2.5–4.6)

## 2023-12-06 LAB — URIC ACID: Uric Acid, Serum: 4.6 mg/dL (ref 2.5–7.1)

## 2023-12-11 ENCOUNTER — Other Ambulatory Visit: Payer: Self-pay

## 2023-12-12 NOTE — Progress Notes (Signed)
 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 female management who is here for continued evaluation of of her SLL.  She continues to be on venetoclax  200 mg p.o. daily with no tolerance issues or side effects.  No significant infection issues. No new lumps or bumps.  No fevers no chills no night sweats.  No unexpected weight loss. PET scan from 10/19/2023 was reviewed with her in detail. No other acute new symptoms  MEDICAL HISTORY:  Past Medical History:  Diagnosis Date   Abnormality of gait 05/10/2010   BACK PAIN 11/14/2008   Class 1 obesity due to excess calories with body mass index (BMI) of 31.0 to 31.9 in adult 02/07/2021   DIABETES MELLITUS, TYPE II 07/15/2008   Diplopia 07/15/2008   ECZEMA, ATOPIC 04/03/2009   GERD (gastroesophageal reflux disease)    Guillain-Barre (HCC) 1988   HYPERLIPIDEMIA 03/06/2009   HYPERTENSION 07/15/2008   Stroke (HCC) 2010, 2011   x2    Vertebral artery stenosis     SURGICAL HISTORY: Past Surgical History:  Procedure Laterality Date   ABDOMINAL HYSTERECTOMY     BIOPSY  07/08/2022   Procedure: BIOPSY;  Surgeon: Leigh Elspeth SQUIBB, MD;  Location: [facility] ENDOSCOPY;  Service: Gastroenterology;;   BIOPSY  10/12/2022   Procedure: BIOPSY;  Surgeon: San Sandor GAILS, DO;  Location: [facility] ENDOSCOPY;  Service: Gastroenterology;;   COLONOSCOPY WITH PROPOFOL  N/A 07/08/2022   Procedure: COLONOSCOPY WITH PROPOFOL ;  Surgeon: Leigh Elspeth SQUIBB, MD;  Location: [facility] ENDOSCOPY;  Service: Gastroenterology;  Laterality: N/A;    DILATION AND CURETTAGE OF UTERUS     FLEXIBLE SIGMOIDOSCOPY N/A 10/12/2022   Procedure: FLEXIBLE SIGMOIDOSCOPY;  Surgeon: San Sandor GAILS, DO;  Location: [facility] ENDOSCOPY;  Service: Gastroenterology;  Laterality: N/A;   FOOT SURGERY     IR ANGIO INTRA EXTRACRAN SEL COM CAROTID INNOMINATE UNI L MOD SED  09/17/2021   IR ANGIO INTRA EXTRACRAN SEL INTERNAL CAROTID UNI R MOD SED  09/17/2021   IR ANGIO VERTEBRAL SEL VERTEBRAL UNI R MOD SED  09/17/2021   IR US  GUIDE VASC ACCESS RIGHT  09/17/2021    SOCIAL HISTORY: Social History   Socioeconomic History   Marital status: Divorced    Spouse name: Not on file   Number of children: 0   Years of education: 13   Highest education level: Not on file  Occupational History   Occupation: custodian at [employer]  Tobacco Use   Smoking status: Never   Smokeless tobacco: Never  Vaping Use   Vaping status: Never Used  Substance and Sexual Activity   Alcohol use: No   Drug use: No   Sexual activity: Not Currently  Other Topics Concern   Not on file  Social History Narrative   Right Handed    Lives at [facility]   No caffeine   Social Drivers of Health   Financial Resource Strain: Not on file  Food Insecurity: No Food Insecurity (09/24/2022)   Hunger Vital Sign    Worried About Running Out of Food in the Last Year: Never true    Ran Out  of Food in the Last Year: Never true  Transportation Needs: No Transportation Needs (09/24/2022)   PRAPARE - Administrator, Civil Service (Medical): No    Lack of Transportation (Non-Medical): No  Physical Activity: Not on file  Stress: Not on file  Social Connections: Not on file  Intimate Partner Violence: Not At Risk (09/24/2022)   Humiliation, Afraid, Rape, and Kick questionnaire    Fear of Current or Ex-Partner: No    Emotionally Abused: No    Physically Abused: No    Sexually Abused: No    FAMILY HISTORY: Family History  Problem Relation Age of Onset   Diabetes Sister     Asthma Other    Stroke Other    Hypertension Other    Diabetes Mother    Stroke Mother    Cancer Father        pt states hae had some kind of stomach cancer, ? stomach or colon   Breast cancer Neg Hx     ALLERGIES:  has no known allergies.  MEDICATIONS:  Current Outpatient Medications  Medication Sig Dispense Refill   acetaminophen  (TYLENOL ) 325 MG tablet Take 650 mg by mouth every 4 (four) hours as needed for mild pain (pain score 1-3) or fever. Do not exceed 3000mg  in 24 hours     allopurinol  (ZYLOPRIM ) 100 MG tablet Take 1 tablet (100 mg total) by mouth 2 (two) times daily. (Patient not taking: Reported on 10/10/2023) 60 tablet 0   amLODipine  (NORVASC ) 10 MG tablet Take 1 tablet by mouth once daily 90 tablet 1   antiseptic oral rinse (BIOTENE) LIQD 15 mLs by Mouth Rinse route 3 (three) times daily.     aspirin  EC 81 MG tablet Take 1 tablet (81 mg total) by mouth daily. Swallow whole. 30 tablet 12   atorvastatin  (LIPITOR ) 80 MG tablet Take 1 tablet (80 mg total) by mouth daily. 30 tablet 3   baclofen  (LIORESAL ) 10 MG tablet Take 1.5 tablets (15 mg total) by mouth 3 (three) times daily. 135 each 5   busPIRone (BUSPAR) 5 MG tablet Take 5 mg by mouth 3 (three) times daily.     cloNIDine (CATAPRES - DOSED IN MG/24 HR) 0.1 mg/24hr patch 0.1 mg once a week.     ezetimibe  (ZETIA ) 10 MG tablet Take 1 tablet (10 mg total) by mouth daily. 30 tablet 3   famotidine  (PEPCID ) 20 MG tablet Take 20 mg by mouth daily.     FLUoxetine  (PROZAC ) 40 MG capsule Take 40 mg by mouth daily.     hydrochlorothiazide  (HYDRODIURIL ) 12.5 MG tablet Take 12.5 mg by mouth daily.     hydrocortisone  cream 1 % Apply 1 Application topically 2 (two) times daily.     LANTUS  SOLOSTAR 100 UNIT/ML Solostar Pen Inject 5 Units into the skin daily. (Patient taking differently: Inject 10 Units into the skin daily.) 15 mL 11   lidocaine  (LIDODERM ) 5 % Place 1 patch onto the skin daily. Remove & Discard patch within 12 hours or as  directed by MD 30 patch 0   losartan  (COZAAR ) 25 MG tablet Take 25 mg by mouth daily.     mesalamine  (CANASA ) 1000 MG suppository Place 1 suppository (1,000 mg total) rectally 2 (two) times daily. (Patient not taking: Reported on 05/31/2023) 30 suppository 12   ondansetron  (ZOFRAN ) 8 MG tablet Take 1 tablet (8 mg total) by mouth every 8 (eight) hours as needed for nausea or vomiting. (Patient not taking: Reported on 05/31/2023) 30  tablet 0   ONE TOUCH LANCETS MISC USE TO CHECK BLOOD SUGAR TWICE A DAY AND PRN 100 each 6   OXYGEN Inhale 2 L/min into the lungs as needed. (Patient not taking: Reported on 05/31/2023)     pantoprazole  (PROTONIX ) 40 MG tablet Take 1 tablet (40 mg total) by mouth daily. 30 tablet 0   senna-docusate (SENOKOT-S) 8.6-50 MG tablet Take 1 tablet by mouth daily as needed for mild constipation. (Patient not taking: Reported on 05/31/2023)     traMADol  HCl 25 MG TABS Take 50 mg by mouth every 6 (six) hours as needed (for moderate pain).     venetoclax  (VENCLEXTA ) 100 MG tablet Take 2 tablets (200 mg total) by mouth daily. Tablets should be swallowed whole with a meal and a full glass of water. 56 tablet 0   No current facility-administered medications for this visit.    REVIEW OF SYSTEMS:    .10 Point review of Systems was done is negative except as noted above.  PHYSICAL EXAMINATION: ECOG PERFORMANCE STATUS: 0 - Asymptomatic Vitals:   12/06/23 1128  BP: 137/81  Pulse: 77  Resp: 18  Temp: (!) 97.3 F (36.3 C)  SpO2: 97%  . GENERAL:alert, in no acute distress and comfortable SKIN: no acute rashes, no significant lesions EYES: conjunctiva are pink and non-injected, sclera anicteric OROPHARYNX: MMM, no exudates, no oropharyngeal erythema or ulceration NECK: supple, no JVD LYMPH:  no palpable lymphadenopathy in the cervical, axillary or inguinal regions LUNGS: clear to auscultation b/l with normal respiratory effort HEART: regular rate & rhythm ABDOMEN:  normoactive  bowel sounds , non tender, not distended. Extremity: no pedal edema PSYCH: alert & oriented x 3 with fluent speech NEURO: no focal motor/sensory deficits  LABORATORY DATA:  I have reviewed the data as listed  .    Latest Ref Rng & Units 12/06/2023   10:05 AM 07/26/2023    8:51 AM 05/29/2023    9:43 AM  CBC  WBC 4.0 - 10.5 K/uL 3.6  5.9  3.3   Hemoglobin 12.0 - 15.0 g/dL 88.5  87.5  88.1   Hematocrit 36.0 - 46.0 % 34.4  36.9  34.9   Platelets 150 - 400 K/uL 192  171  186     .    Latest Ref Rng & Units 12/06/2023   10:05 AM 07/26/2023    8:51 AM 05/29/2023    9:43 AM  CMP  Glucose 70 - 99 mg/dL 702  787  841   BUN 8 - 23 mg/dL 29  35  33   Creatinine 0.44 - 1.00 mg/dL 9.08  9.10  9.06   Sodium 135 - 145 mmol/L 140  139  138   Potassium 3.5 - 5.1 mmol/L 4.3  4.5  4.1   Chloride 98 - 111 mmol/L 106  108  107   CO2 22 - 32 mmol/L 31  27  27    Calcium  8.9 - 10.3 mg/dL 89.8  89.8  89.6   Total Protein 6.5 - 8.1 g/dL 7.4  7.6  7.4   Total Bilirubin 0.0 - 1.2 mg/dL 0.3  0.4  0.4   Alkaline Phos 38 - 126 U/L 81  104  90   AST 15 - 41 U/L 20  26  17    ALT 0 - 44 U/L 25  30  22     . Lab Results  Component Value Date   LDH 228 (H) 03/13/2023   Alpha-Thalassemia Comment:  Comment: (NOTE) Test: Alpha-Thalassemia, DNA  Analysis Result:     Alpha-+-thalassemia trait, alpha alpha/alpha- Mutation(s) identified: alpha3.7    07/16/2019 FISH-CLL Prognostic Panel:         RADIOGRAPHIC STUDIES: I have personally reviewed the radiological images as listed and agreed with the findings in the report. NM PET Image Restag (PS) Skull Base To Thigh (Accession [id number]) (Order [id number]) Imaging Date: 10/19/2023 Department: [facility] Released By: Jarold Tanda CROME Authorizing: Onesimo Emaline Brink, MD   Exam Status  Status  Final [99]   PACS Intelerad Image Link   Show images for NM PET Image Restag (PS) Skull Base To Thigh Study  Result  Narrative & Impression  CLINICAL DATA:  Subsequent treatment strategy for SLL. Small lymphocytic lymphoma.   EXAM: NUCLEAR MEDICINE PET SKULL BASE TO THIGH   TECHNIQUE: 7.9 mCi F-18 FDG was injected intravenously. Full-ring PET imaging was performed from the skull base to thigh after the radiotracer. CT data was obtained and used for attenuation correction and anatomic localization.   Fasting blood glucose: 172 mg/dl   COMPARISON:  PET-CT 87/86/7975   FINDINGS: NECK: No hypermetabolic lymph nodes in the neck.   Incidental CT findings: None.   CHEST: Marked reduction in previous seen bulky axillary adenopathy. For example LEFT axillary node measuring 7 mm (image 50/4) decreased from 33 mm. Mild metabolic activity within this node less than blood pool activity.   Similar reduction and supraclavicular adenopathy. No residual supraclavicular nodes.   There is persistent hypermetabolic activity associated with a nodule within the RIGHT lobe of the thyroid  gland with SUV max equal 5.9 decreased from 9.1.   Incidental CT findings: None.   ABDOMEN/PELVIS: Interval resolution of the bulky periaortic retroperitoneal lymph nodes. Lymph nodes remain are not pathologically enlarged. No abnormal metabolic activity within small periaortic lymph nodes.   Spleen is decreased in volume measuring 3.3 cm in width on image 76 decreased from 4.8 cm. No abnormal metabolic activity in the spleen   Interval resolution of previously described iliac and inguinal lymphadenopathy. For example small residual LEFT inguinal lymph node measuring 5 mm decreased from 27 mm. No abnormal metabolic activity   Kidneys appear normal.   Incidental CT findings: None.   SKELETON: No focal hypermetabolic activity to suggest skeletal metastasis.   Incidental CT findings: None.   IMPRESSION: 1. Marked reduction in bulky axillary, supraclavicular, retroperitoneal, iliac, and inguinal  lymphadenopathy. No residual hypermetabolic adenopathy. 2. Decreased size of the spleen. 3. Persistent hypermetabolic activity associated with a nodule in the RIGHT lobe of the thyroid  gland. Recommend thyroid  ultrasound evaluation.     Electronically Signed   By: Jackquline Boxer M.D.   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 No consitutional symptoms.  05/31/2019 Mammogram ([id number]) revealed Further evaluation is suggested for prominent lymph nodes in the bilateral axilla. 06/26/2019 Flow Pathology ([id number]) revealed Monoclonal B-cell population identified. 06/26/2019 Right Axilla LN Bx [phone]) revealed NON-HODGKIN B-CELL LYMPHOMA  07/16/2019 FISH/CLL Prognostic Panel ([id number]) revealed Trisomy 12 (+12) is present.  10/15/2019 CT Soft Tissue Neck ([id number]) revealed bilateral cervical lymphadenopathy. 10/15/2019 CT C/A/P ([id number]) ([id number]) revealed several small lymph nodes in axillary, retroperitoneal, and pelvic regions. Incidentally noted complex renal cyst.   2) RBC microcytosis without Anemia and normal RDW and relative erythrocytosis -- due to alpha thal trait Alpha-Thalassemia Comment:  Comment: (  NOTE) Test: Alpha-Thalassemia, DNA Analysis Result:     Alpha-+-thalassemia trait, alpha alpha/alpha- Mutation(s) identified: alpha3.7    3) nonmelanoma skin cancer on lower extremities. -Continue follow-up with dermatology for evaluation and management. -I discussed with the patient that her non-Hodgkin's lymphoma/SLL could be a risk factor for recurrent nonmelanoma skin cancers.  4) .FDG avid Thyroid  Nodule  5) Patient Active Problem List   Diagnosis Date Noted   Protein-calorie malnutrition, severe 10/08/2022   Severe sepsis (HCC) 10/05/2022   Dehydration  10/05/2022   Acute prerenal azotemia 10/05/2022   Proctitis 10/05/2022   Hypercalcemia 10/05/2022   Transaminitis 10/05/2022   Pyuria 10/05/2022   Depression 10/05/2022   CAP (community acquired pneumonia) 10/04/2022   Malnutrition of moderate degree 09/26/2022   SOB (shortness of breath) 09/25/2022   Nausea vomiting and diarrhea 09/24/2022   Shortness of breath 09/24/2022   Acute respiratory failure with hypoxia (HCC) 09/24/2022   LFT elevation 09/24/2022   Diabetes mellitus (HCC) 09/06/2022   Rectal ulcer 07/09/2022   Colon ulcer 07/08/2022   Rectal bleeding 07/07/2022   Anemia 07/07/2022   GI bleed 07/06/2022   Dysarthria 06/12/2022   Aspiration pneumonia (HCC) 06/12/2022   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   Anxiety disorder due to medical condition 06/10/2022   AKI (acute kidney injury) (HCC) 06/01/2022   Dizziness 05/29/2022   Personal history of CLL (chronic lymphocytic leukemia) 05/29/2022   Rectal pain 05/29/2022   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 disease, with long-term current use of insulin  (HCC) 04/14/2022   Type 2 diabetes mellitus with diabetic polyneuropathy, with long-term current use of insulin  (HCC) 04/14/2022   Antiplatelet or antithrombotic long-term use 03/16/2022   HLD (hyperlipidemia) 10/01/2021   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HCC) 09/17/2021   Stage 3a chronic kidney disease (CKD) (HCC) 09/16/2021   Thyroid  nodule 09/16/2021   Cervical lymphadenopathy 09/16/2021   Acute CVA (cerebrovascular accident) (HCC) 09/15/2021   GERD (gastroesophageal reflux disease) 04/30/2021   Peripheral arterial disease (HCC) 04/13/2021   Atherosclerosis of aorta (HCC) 02/14/2021   Cerebral thrombosis with cerebral infarction 02/08/2021   Abnormal MRI of head 02/07/2021   Vertigo 02/07/2021   Class 1 obesity due to excess calories with body mass index (BMI) of 31.0 to 31.9 in adult 02/07/2021   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    Trigger finger, left ring finger 10/04/2018   Alternating constipation and diarrhea 04/20/2018   Diabetic peripheral neuropathy associated with type 2 diabetes mellitus (HCC) 02/26/2018   DM2 (diabetes mellitus, type 2) (HCC) 07/29/2015   ABNORMALITY OF GAIT 05/10/2010   ECZEMA, ATOPIC 04/03/2009   Hyperlipidemia associated with type 2 diabetes mellitus (HCC) 03/06/2009   BACK PAIN 11/14/2008   Hemiparesis affecting left side as late effect of stroke (HCC) 08/05/2008   DIPLOPIA 07/15/2008   Essential hypertension 07/15/2008   PLAN:  -Discussed lab results on 12/26/2023 in detail with patient.  - CBC shows stable hemoglobin of 11.4, WBC count of 3.6k platelets of 192k CMP shows glucose level of 297 otherwise unremarkable. Uric acid level of 4.6 within normal limits PET CT scan from 10/19/2023 showed resolution of all of the patient's FDG avid lymphadenopathy suggesting excellent response to treatment.  No evidence of disease progression or new disease. -Patient has been tolerating her current dose of venetoclax  without any new or notable toxicities. - Patient will continue her venetoclax  at 200 mg p.o. daily. - She had an incidentally noted FDG avid thyroid  nodule which we discussed.  Patient  would like to evaluate this further with an ultrasound of the thyroid  which we shall do prior to her next visit with us . - Patient's daughter is present with her and is agreeable to this treatment plan.  FOLLOW-UP:  US  thyroid  in 8 weeks RTC with Dr Onesimo with labs in 12 weeks   The total time spent in the appointment was 30 minutes*.  All of the patient's questions were answered with apparent satisfaction. The patient knows to call the clinic with any problems, questions or concerns.   Emaline Onesimo MD MS AAHIVMS [facility] Hematology/Oncology Physician [facility]  .*Total Encounter Time as defined by the Centers for Medicare and Medicaid Services includes, in addition to the  face-to-face time of a patient visit (documented in the note above) non-face-to-face time: obtaining and reviewing outside history, ordering and reviewing medications, tests or procedures, care coordination (communications with other health care professionals or caregivers) and documentation in the medical record.

## 2024-01-08 ENCOUNTER — Other Ambulatory Visit: Payer: Self-pay

## 2024-01-08 ENCOUNTER — Other Ambulatory Visit: Payer: Self-pay | Admitting: Hematology

## 2024-01-08 MED ORDER — VENETOCLAX 100 MG PO TABS
200.0000 mg | ORAL_TABLET | Freq: Every day | ORAL | 0 refills | Status: DC
  Filled 2024-01-08 – 2024-02-06 (×2): qty 56, 28d supply, fill #0

## 2024-01-09 ENCOUNTER — Other Ambulatory Visit: Payer: Self-pay

## 2024-01-09 NOTE — Progress Notes (Signed)
 Specialty Pharmacy Ongoing Clinical Assessment Note  I spoke to Darice, RN at [facility]. [name] is a 63 y.o. female who is being followed by the specialty pharmacy service for RxSp Oncology   Patient's specialty medication(s) reviewed today: Venetoclax  (VENCLEXTA )   Missed doses in the last 4 weeks: 0 (patient has more than a month worth on hand, Darice, RN is unsure of how this is but reports they have it documented to take 2/day and have no reported missed doses)   Patient/Caregiver did not have any additional questions or concerns.   Therapeutic benefit summary: Patient is achieving benefit   Adverse events/side effects summary: No adverse events/side effects   Patient's therapy is appropriate to: Continue    Goals Addressed             This Visit's Progress    Maintain optimal adherence to therapy   On track    Patient is on track. Patient will maintain adherence.  PET CT scan from 10/19/2023 showed resolution of all of the patient's FDG avid lymphadenopathy suggesting excellent response to treatment.  No evidence of disease progression or new disease.          Follow up: 6 months  Silvano LOISE [name] Specialty Pharmacist

## 2024-01-24 ENCOUNTER — Ambulatory Visit (HOSPITAL_COMMUNITY)

## 2024-02-06 ENCOUNTER — Other Ambulatory Visit: Payer: Self-pay

## 2024-02-06 NOTE — Progress Notes (Signed)
 Specialty Pharmacy Refill Coordination Note  [name] is a 63 y.o. female contacted today regarding refills of specialty medication(s) Venetoclax  (VENCLEXTA )   Patient requested Delivery   Delivery date: 02/08/24   Verified address: [address] ATTN: [name]   Medication will be filled on 10.08.25.

## 2024-02-07 ENCOUNTER — Ambulatory Visit (HOSPITAL_COMMUNITY)
Admission: RE | Admit: 2024-02-07 | Discharge: 2024-02-07 | Disposition: A | Discharge status: Home / Self Care | Source: Ambulatory Visit | Attending: Hematology | Admitting: Hematology

## 2024-02-07 DIAGNOSIS — E041 Nontoxic single thyroid nodule: Secondary | ICD-10-CM | POA: Diagnosis present

## 2024-02-28 ENCOUNTER — Other Ambulatory Visit: Payer: Self-pay

## 2024-02-28 ENCOUNTER — Inpatient Hospital Stay: Admitting: Hematology

## 2024-02-28 ENCOUNTER — Other Ambulatory Visit: Payer: Self-pay | Admitting: Hematology

## 2024-02-28 ENCOUNTER — Other Ambulatory Visit (HOSPITAL_COMMUNITY): Payer: Self-pay

## 2024-02-28 ENCOUNTER — Inpatient Hospital Stay: Attending: Hematology

## 2024-02-28 MED ORDER — VENETOCLAX 100 MG PO TABS
200.0000 mg | ORAL_TABLET | Freq: Every day | ORAL | 0 refills | Status: DC
  Filled 2024-02-28: qty 56, 28d supply, fill #0

## 2024-02-28 NOTE — Progress Notes (Signed)
 Specialty Pharmacy Refill Coordination Note  [name] is a 63 y.o. female contacted today regarding refills of specialty medication(s) Venetoclax  (VENCLEXTA )   Patient requested Delivery   Delivery date: 03/07/24   Verified address: [address] ATTN: [name]   Medication will be filled on: 03/06/24

## 2024-02-28 NOTE — Progress Notes (Incomplete)
 HEMATOLOGY ONCOLOGY PROGRESS NOTE  Date of service: 02/28/2024  Patient Care Team: [name], DO as PCP - General Patel, Donika K, DO as Consulting Physician (Neurology)  CHIEF COMPLAINT/PURPOSE OF CONSULTATION:   HISTORY OF PRESENTING ILLNESS:    SUMMARY OF ONCOLOGIC HISTORY: Oncology History   No history exists.   Cycle *** / Day *** of [No matching plan found]  INTERVAL HISTORY: [name] is a 63 y.o. female who is here today for ***. {ELcompanions/ambulations (Optional):33762}  she was last seen by me on 12/06/2023; at the time she {Concerns:31667}  Today, she  Denies any []  new infection issues, []  fevers/chills, []  drenching night sweats, []  unexpected weight change, []  back pain, []  chest pain,  []  abdominal pain, []  new bone pains, []  new lumps/bumps, []  leg swelling, []  bleeding issues (nose bleeds, gum bleeds, abnormal/spontaneous bruising), []  nausea/vomiting, []  diarrhea       REVIEW OF SYSTEMS:    10 Point review of systems of done and is negative except as noted above.  MEDICAL HISTORY Past Medical History:  Diagnosis Date   Abnormality of gait 05/10/2010   BACK PAIN 11/14/2008   Class 1 obesity due to excess calories with body mass index (BMI) of 31.0 to 31.9 in adult 02/07/2021   DIABETES MELLITUS, TYPE II 07/15/2008   Diplopia 07/15/2008   ECZEMA, ATOPIC 04/03/2009   GERD (gastroesophageal reflux disease)    Guillain-Barre 1988   HYPERLIPIDEMIA 03/06/2009   HYPERTENSION 07/15/2008   Stroke (HCC) 2010, 2011   x2    Vertebral artery stenosis     SURGICAL HISTORY Past Surgical History:  Procedure Laterality Date   ABDOMINAL HYSTERECTOMY     BIOPSY  07/08/2022   Procedure: BIOPSY;  Surgeon: Leigh Elspeth SQUIBB, MD;  Location: [facility] ENDOSCOPY;  Service: Gastroenterology;;   BIOPSY  10/12/2022   Procedure: BIOPSY;  Surgeon: San Sandor GAILS, DO;  Location: [facility] ENDOSCOPY;  Service: Gastroenterology;;   COLONOSCOPY WITH PROPOFOL  N/A  07/08/2022   Procedure: COLONOSCOPY WITH PROPOFOL ;  Surgeon: Leigh Elspeth SQUIBB, MD;  Location: [facility] ENDOSCOPY;  Service: Gastroenterology;  Laterality: N/A;   DILATION AND CURETTAGE OF UTERUS     FLEXIBLE SIGMOIDOSCOPY N/A 10/12/2022   Procedure: FLEXIBLE SIGMOIDOSCOPY;  Surgeon: San Sandor GAILS, DO;  Location: [facility] ENDOSCOPY;  Service: Gastroenterology;  Laterality: N/A;   FOOT SURGERY     IR ANGIO INTRA EXTRACRAN SEL COM CAROTID INNOMINATE UNI L MOD SED  09/17/2021   IR ANGIO INTRA EXTRACRAN SEL INTERNAL CAROTID UNI R MOD SED  09/17/2021   IR ANGIO VERTEBRAL SEL VERTEBRAL UNI R MOD SED  09/17/2021   IR US  GUIDE VASC ACCESS RIGHT  09/17/2021    SOCIAL HISTORY Social History   Tobacco Use   Smoking status: Never   Smokeless tobacco: Never  Vaping Use   Vaping status: Never Used  Substance Use Topics   Alcohol use: No   Drug use: No    Social History   Social History Narrative   Right Handed    Lives at [facility]   No caffeine    SOCIAL DRIVERS OF HEALTH SDOH Screenings   Food Insecurity: No Food Insecurity (09/24/2022)  Housing: Low Risk  (09/24/2022)  Transportation Needs: No Transportation Needs (09/24/2022)  Utilities: Not At Risk (09/24/2022)  Depression (PHQ2-9): Low Risk  (03/16/2022)  Tobacco Use: Low Risk  (10/10/2023)     FAMILY HISTORY Family History  Problem Relation Age of Onset   Diabetes Sister    Asthma Other  Stroke Other    Hypertension Other    Diabetes Mother    Stroke Mother    Cancer Father        pt states hae had some kind of stomach cancer, ? stomach or colon   Breast cancer Neg Hx      ALLERGIES: has no known allergies.  MEDICATIONS  Current Outpatient Medications  Medication Sig Dispense Refill   acetaminophen  (TYLENOL ) 325 MG tablet Take 650 mg by mouth every 4 (four) hours as needed for mild pain (pain score 1-3) or fever. Do not exceed 3000mg  in 24 hours     allopurinol  (ZYLOPRIM ) 100 MG tablet Take 1 tablet (100 mg total)  by mouth 2 (two) times daily. (Patient not taking: Reported on 10/10/2023) 60 tablet 0   amLODipine  (NORVASC ) 10 MG tablet Take 1 tablet by mouth once daily 90 tablet 1   antiseptic oral rinse (BIOTENE) LIQD 15 mLs by Mouth Rinse route 3 (three) times daily.     aspirin  EC 81 MG tablet Take 1 tablet (81 mg total) by mouth daily. Swallow whole. 30 tablet 12   atorvastatin  (LIPITOR ) 80 MG tablet Take 1 tablet (80 mg total) by mouth daily. 30 tablet 3   baclofen  (LIORESAL ) 10 MG tablet Take 1.5 tablets (15 mg total) by mouth 3 (three) times daily. 135 each 5   busPIRone (BUSPAR) 5 MG tablet Take 5 mg by mouth 3 (three) times daily.     cloNIDine (CATAPRES - DOSED IN MG/24 HR) 0.1 mg/24hr patch 0.1 mg once a week.     ezetimibe  (ZETIA ) 10 MG tablet Take 1 tablet (10 mg total) by mouth daily. 30 tablet 3   famotidine  (PEPCID ) 20 MG tablet Take 20 mg by mouth daily.     FLUoxetine  (PROZAC ) 40 MG capsule Take 40 mg by mouth daily.     hydrochlorothiazide  (HYDRODIURIL ) 12.5 MG tablet Take 12.5 mg by mouth daily.     hydrocortisone  cream 1 % Apply 1 Application topically 2 (two) times daily.     LANTUS  SOLOSTAR 100 UNIT/ML Solostar Pen Inject 5 Units into the skin daily. (Patient taking differently: Inject 10 Units into the skin daily.) 15 mL 11   lidocaine  (LIDODERM ) 5 % Place 1 patch onto the skin daily. Remove & Discard patch within 12 hours or as directed by MD 30 patch 0   losartan  (COZAAR ) 25 MG tablet Take 25 mg by mouth daily.     mesalamine  (CANASA ) 1000 MG suppository Place 1 suppository (1,000 mg total) rectally 2 (two) times daily. (Patient not taking: Reported on 05/31/2023) 30 suppository 12   ondansetron  (ZOFRAN ) 8 MG tablet Take 1 tablet (8 mg total) by mouth every 8 (eight) hours as needed for nausea or vomiting. (Patient not taking: Reported on 05/31/2023) 30 tablet 0   ONE TOUCH LANCETS MISC USE TO CHECK BLOOD SUGAR TWICE A DAY AND PRN 100 each 6   OXYGEN Inhale 2 L/min into the lungs as  needed. (Patient not taking: Reported on 05/31/2023)     pantoprazole  (PROTONIX ) 40 MG tablet Take 1 tablet (40 mg total) by mouth daily. 30 tablet 0   senna-docusate (SENOKOT-S) 8.6-50 MG tablet Take 1 tablet by mouth daily as needed for mild constipation. (Patient not taking: Reported on 05/31/2023)     traMADol  HCl 25 MG TABS Take 50 mg by mouth every 6 (six) hours as needed (for moderate pain).     venetoclax  (VENCLEXTA ) 100 MG tablet Take 2 tablets (200 mg total)  by mouth daily. Tablets should be swallowed whole with a meal and a full glass of water. 56 tablet 0   No current facility-administered medications for this visit.    PHYSICAL EXAMINATION: ECOG PERFORMANCE STATUS: {CHL ONC ECOG ED:8845999799} VITALS: There were no vitals filed for this visit. There were no vitals filed for this visit. There is no height or weight on file to calculate BMI.  GENERAL: alert, in no acute distress and comfortable SKIN: no acute rashes, no significant lesions EYES: conjunctiva are pink and non-injected, sclera anicteric OROPHARYNX: MMM, no exudates, no oropharyngeal erythema or ulceration NECK: supple, no JVD LYMPH:  no palpable lymphadenopathy in the cervical, axillary or inguinal regions LUNGS: clear to auscultation b/l with normal respiratory effort HEART: regular rate & rhythm ABDOMEN:  normoactive bowel sounds , non tender, not distended, no hepatosplenomegaly Extremity: no pedal edema PSYCH: alert & oriented x 3 with fluent speech NEURO: no focal motor/sensory deficits  LABORATORY DATA:   I have reviewed the data as listed     Latest Ref Rng & Units 12/06/2023   10:05 AM 07/26/2023    8:51 AM 05/29/2023    9:43 AM  CBC EXTENDED  WBC 4.0 - 10.5 K/uL 3.6  5.9  3.3   RBC 3.87 - 5.11 MIL/uL 4.19  4.68  4.50   Hemoglobin 12.0 - 15.0 g/dL 88.5  87.5  88.1   HCT 36.0 - 46.0 % 34.4  36.9  34.9   Platelets 150 - 400 K/uL 192  171  186   NEUT# 1.7 - 7.7 K/uL 2.0  3.0  1.6   Lymph# 0.7 - 4.0  K/uL 0.9  1.2  1.1     {ELAddOncLabs (Optional):33736}     Latest Ref Rng & Units 12/06/2023   10:05 AM 07/26/2023    8:51 AM 05/29/2023    9:43 AM  CMP  Glucose 70 - 99 mg/dL 702  787  841   BUN 8 - 23 mg/dL 29  35  33   Creatinine 0.44 - 1.00 mg/dL 9.08  9.10  9.06   Sodium 135 - 145 mmol/L 140  139  138   Potassium 3.5 - 5.1 mmol/L 4.3  4.5  4.1   Chloride 98 - 111 mmol/L 106  108  107   CO2 22 - 32 mmol/L 31  27  27    Calcium  8.9 - 10.3 mg/dL 89.8  89.8  89.6   Total Protein 6.5 - 8.1 g/dL 7.4  7.6  7.4   Total Bilirubin 0.0 - 1.2 mg/dL 0.3  0.4  0.4   Alkaline Phos 38 - 126 U/L 81  104  90   AST 15 - 41 U/L 20  26  17    ALT 0 - 44 U/L 25  30  22       RADIOGRAPHIC STUDIES: I have personally reviewed the radiological images as listed and agreed with the findings in the report. {ELImaging:31163}  ASSESSMENT & PLAN:  63 y.o. female with    PLAN: - Discussed lab results on 02/28/2024 in detail with patient: CBC showed {ELGKCBC:33763} CMP with Creatinine *** {ELIncreased/Decreased:33631} from *** and Calcium  *** {ELIncreased/Decreased:33631} from ***.  {ELGKMyeloma&Lightchains (Optional):33764}  FOLLOW-UP in *** for labs and follow-up with Dr. Onesimo.  The total time spent in the appointment was *** minutes* .  All of the patient's questions were answered and the patient knows to call the clinic with any problems, questions, or concerns.  Emaline Onesimo MD MS AAHIVMS [facility] Hematology/Oncology  Physician [facility]  *Total Encounter Time as defined by the Centers for Medicare and Medicaid Services includes, in addition to the face-to-face time of a patient visit (documented in the note above) non-face-to-face time: obtaining and reviewing outside history, ordering and reviewing medications, tests or procedures, care coordination (communications with other health care professionals or caregivers) and documentation in the medical record.  I,Emily Lagle,acting  as a neurosurgeon for Emaline Saran, MD.,have documented all relevant documentation on the behalf of Emaline Saran, MD,as directed by  Emaline Saran, MD while in the presence of Emaline Saran, MD.  I have reviewed the above documentation for accuracy and completeness, and I agree with the above.  Gautam Kale, MD

## 2024-03-01 ENCOUNTER — Inpatient Hospital Stay (HOSPITAL_COMMUNITY)
Admission: EM | Admit: 2024-03-01 | Discharge: 2024-03-06 | DRG: 071 | Disposition: A | Discharge status: Skilled Nursing Facility | Attending: Internal Medicine | Admitting: Internal Medicine

## 2024-03-01 ENCOUNTER — Encounter (HOSPITAL_COMMUNITY): Payer: Self-pay

## 2024-03-01 ENCOUNTER — Emergency Department (HOSPITAL_COMMUNITY)

## 2024-03-01 ENCOUNTER — Other Ambulatory Visit: Payer: Self-pay

## 2024-03-01 ENCOUNTER — Observation Stay (HOSPITAL_COMMUNITY)

## 2024-03-01 DIAGNOSIS — C8590 Non-Hodgkin lymphoma, unspecified, unspecified site: Secondary | ICD-10-CM | POA: Diagnosis present

## 2024-03-01 DIAGNOSIS — Z825 Family history of asthma and other chronic lower respiratory diseases: Secondary | ICD-10-CM

## 2024-03-01 DIAGNOSIS — I69322 Dysarthria following cerebral infarction: Secondary | ICD-10-CM

## 2024-03-01 DIAGNOSIS — D630 Anemia in neoplastic disease: Secondary | ICD-10-CM | POA: Diagnosis present

## 2024-03-01 DIAGNOSIS — Z794 Long term (current) use of insulin: Secondary | ICD-10-CM

## 2024-03-01 DIAGNOSIS — Z90710 Acquired absence of both cervix and uterus: Secondary | ICD-10-CM

## 2024-03-01 DIAGNOSIS — Z8249 Family history of ischemic heart disease and other diseases of the circulatory system: Secondary | ICD-10-CM

## 2024-03-01 DIAGNOSIS — Z1152 Encounter for screening for COVID-19: Secondary | ICD-10-CM

## 2024-03-01 DIAGNOSIS — Z7984 Long term (current) use of oral hypoglycemic drugs: Secondary | ICD-10-CM

## 2024-03-01 DIAGNOSIS — E6609 Other obesity due to excess calories: Secondary | ICD-10-CM | POA: Diagnosis present

## 2024-03-01 DIAGNOSIS — Z87440 Personal history of urinary (tract) infections: Secondary | ICD-10-CM

## 2024-03-01 DIAGNOSIS — R4182 Altered mental status, unspecified: Principal | ICD-10-CM

## 2024-03-01 DIAGNOSIS — T4145XA Adverse effect of unspecified anesthetic, initial encounter: Secondary | ICD-10-CM | POA: Diagnosis present

## 2024-03-01 DIAGNOSIS — G4700 Insomnia, unspecified: Secondary | ICD-10-CM | POA: Diagnosis present

## 2024-03-01 DIAGNOSIS — G9341 Metabolic encephalopathy: Secondary | ICD-10-CM | POA: Diagnosis not present

## 2024-03-01 DIAGNOSIS — Z960 Presence of urogenital implants: Secondary | ICD-10-CM | POA: Diagnosis present

## 2024-03-01 DIAGNOSIS — E0590 Thyrotoxicosis, unspecified without thyrotoxic crisis or storm: Secondary | ICD-10-CM | POA: Diagnosis present

## 2024-03-01 DIAGNOSIS — K089 Disorder of teeth and supporting structures, unspecified: Secondary | ICD-10-CM | POA: Diagnosis present

## 2024-03-01 DIAGNOSIS — Z9889 Other specified postprocedural states: Secondary | ICD-10-CM

## 2024-03-01 DIAGNOSIS — N320 Bladder-neck obstruction: Secondary | ICD-10-CM | POA: Diagnosis present

## 2024-03-01 DIAGNOSIS — K219 Gastro-esophageal reflux disease without esophagitis: Secondary | ICD-10-CM | POA: Diagnosis present

## 2024-03-01 DIAGNOSIS — Z833 Family history of diabetes mellitus: Secondary | ICD-10-CM

## 2024-03-01 DIAGNOSIS — E1140 Type 2 diabetes mellitus with diabetic neuropathy, unspecified: Secondary | ICD-10-CM | POA: Diagnosis present

## 2024-03-01 DIAGNOSIS — R410 Disorientation, unspecified: Secondary | ICD-10-CM | POA: Diagnosis not present

## 2024-03-01 DIAGNOSIS — R441 Visual hallucinations: Secondary | ICD-10-CM | POA: Diagnosis present

## 2024-03-01 DIAGNOSIS — L89152 Pressure ulcer of sacral region, stage 2: Secondary | ICD-10-CM | POA: Diagnosis present

## 2024-03-01 DIAGNOSIS — E785 Hyperlipidemia, unspecified: Secondary | ICD-10-CM | POA: Diagnosis present

## 2024-03-01 DIAGNOSIS — Z79899 Other long term (current) drug therapy: Secondary | ICD-10-CM

## 2024-03-01 DIAGNOSIS — Z7982 Long term (current) use of aspirin: Secondary | ICD-10-CM

## 2024-03-01 DIAGNOSIS — I69354 Hemiplegia and hemiparesis following cerebral infarction affecting left non-dominant side: Secondary | ICD-10-CM

## 2024-03-01 DIAGNOSIS — E860 Dehydration: Secondary | ICD-10-CM | POA: Diagnosis present

## 2024-03-01 DIAGNOSIS — R339 Retention of urine, unspecified: Secondary | ICD-10-CM | POA: Diagnosis present

## 2024-03-01 DIAGNOSIS — I119 Hypertensive heart disease without heart failure: Secondary | ICD-10-CM | POA: Diagnosis present

## 2024-03-01 DIAGNOSIS — R059 Cough, unspecified: Secondary | ICD-10-CM | POA: Diagnosis present

## 2024-03-01 DIAGNOSIS — Z6839 Body mass index (BMI) 39.0-39.9, adult: Secondary | ICD-10-CM

## 2024-03-01 DIAGNOSIS — Z823 Family history of stroke: Secondary | ICD-10-CM

## 2024-03-01 DIAGNOSIS — E876 Hypokalemia: Secondary | ICD-10-CM | POA: Diagnosis not present

## 2024-03-01 DIAGNOSIS — K0889 Other specified disorders of teeth and supporting structures: Secondary | ICD-10-CM | POA: Diagnosis present

## 2024-03-01 DIAGNOSIS — F05 Delirium due to known physiological condition: Secondary | ICD-10-CM | POA: Diagnosis present

## 2024-03-01 DIAGNOSIS — N281 Cyst of kidney, acquired: Secondary | ICD-10-CM | POA: Diagnosis present

## 2024-03-01 DIAGNOSIS — N1411 Contrast-induced nephropathy: Secondary | ICD-10-CM | POA: Diagnosis present

## 2024-03-01 DIAGNOSIS — N179 Acute kidney failure, unspecified: Secondary | ICD-10-CM | POA: Diagnosis present

## 2024-03-01 DIAGNOSIS — R443 Hallucinations, unspecified: Secondary | ICD-10-CM

## 2024-03-01 DIAGNOSIS — E669 Obesity, unspecified: Secondary | ICD-10-CM | POA: Diagnosis present

## 2024-03-01 LAB — RESP PANEL BY RT-PCR (RSV, FLU A&B, COVID)  RVPGX2
Influenza A by PCR: NEGATIVE
Influenza B by PCR: NEGATIVE
Resp Syncytial Virus by PCR: NEGATIVE
SARS Coronavirus 2 by RT PCR: NEGATIVE

## 2024-03-01 LAB — URINALYSIS, ROUTINE W REFLEX MICROSCOPIC
Bilirubin Urine: NEGATIVE
Glucose, UA: 50 mg/dL — AB
Ketones, ur: 20 mg/dL — AB
Leukocytes,Ua: NEGATIVE
Nitrite: NEGATIVE
Protein, ur: >=300 mg/dL — AB
Specific Gravity, Urine: 1.012 (ref 1.005–1.030)
pH: 5 (ref 5.0–8.0)

## 2024-03-01 LAB — CBC WITH DIFFERENTIAL/PLATELET
Abs Immature Granulocytes: 0.04 K/uL (ref 0.00–0.07)
Basophils Absolute: 0 K/uL (ref 0.0–0.1)
Basophils Relative: 0 %
Eosinophils Absolute: 0 K/uL (ref 0.0–0.5)
Eosinophils Relative: 0 %
HCT: 37.7 % (ref 36.0–46.0)
Hemoglobin: 12.3 g/dL (ref 12.0–15.0)
Immature Granulocytes: 1 %
Lymphocytes Relative: 20 %
Lymphs Abs: 1 K/uL (ref 0.7–4.0)
MCH: 27.2 pg (ref 26.0–34.0)
MCHC: 32.6 g/dL (ref 30.0–36.0)
MCV: 83.2 fL (ref 80.0–100.0)
Monocytes Absolute: 0.5 K/uL (ref 0.1–1.0)
Monocytes Relative: 10 %
Neutro Abs: 3.7 K/uL (ref 1.7–7.7)
Neutrophils Relative %: 69 %
Platelets: 255 K/uL (ref 150–400)
RBC: 4.53 MIL/uL (ref 3.87–5.11)
RDW: 14.6 % (ref 11.5–15.5)
WBC: 5.3 K/uL (ref 4.0–10.5)
nRBC: 0 % (ref 0.0–0.2)

## 2024-03-01 LAB — COMPREHENSIVE METABOLIC PANEL WITH GFR
ALT: 22 U/L (ref 0–44)
AST: 24 U/L (ref 15–41)
Albumin: 3.2 g/dL — ABNORMAL LOW (ref 3.5–5.0)
Alkaline Phosphatase: 69 U/L (ref 38–126)
Anion gap: 16 — ABNORMAL HIGH (ref 5–15)
BUN: 25 mg/dL — ABNORMAL HIGH (ref 8–23)
CO2: 20 mmol/L — ABNORMAL LOW (ref 22–32)
Calcium: 10 mg/dL (ref 8.9–10.3)
Chloride: 104 mmol/L (ref 98–111)
Creatinine, Ser: 1.18 mg/dL — ABNORMAL HIGH (ref 0.44–1.00)
GFR, Estimated: 52 mL/min — ABNORMAL LOW (ref >60)
Glucose, Bld: 157 mg/dL — ABNORMAL HIGH (ref 70–99)
Potassium: 4.4 mmol/L (ref 3.5–5.1)
Sodium: 140 mmol/L (ref 135–145)
Total Bilirubin: 1.5 mg/dL — ABNORMAL HIGH (ref 0.0–1.2)
Total Protein: 7.2 g/dL (ref 6.5–8.1)

## 2024-03-01 LAB — ETHANOL: Alcohol, Ethyl (B): <15 mg/dL (ref <15)

## 2024-03-01 LAB — RAPID URINE DRUG SCREEN, HOSP PERFORMED
Amphetamines: NOT DETECTED
Barbiturates: NOT DETECTED
Benzodiazepines: NOT DETECTED
Cocaine: NOT DETECTED
Opiates: NOT DETECTED
Tetrahydrocannabinol: NOT DETECTED

## 2024-03-01 LAB — AMMONIA: Ammonia: 23 umol/L (ref 9–35)

## 2024-03-01 LAB — GLUCOSE, CAPILLARY: Glucose-Capillary: 131 mg/dL — ABNORMAL HIGH (ref 70–99)

## 2024-03-01 LAB — I-STAT VENOUS BLOOD GAS, ED
Acid-Base Excess: 0 mmol/L (ref 0.0–2.0)
Bicarbonate: 23.5 mmol/L (ref 20.0–28.0)
Calcium, Ion: 1.32 mmol/L (ref 1.15–1.40)
HCT: 35 % — ABNORMAL LOW (ref 36.0–46.0)
Hemoglobin: 11.9 g/dL — ABNORMAL LOW (ref 12.0–15.0)
O2 Saturation: 97 %
Potassium: 4.4 mmol/L (ref 3.5–5.1)
Sodium: 142 mmol/L (ref 135–145)
TCO2: 24 mmol/L (ref 22–32)
pCO2, Ven: 32.4 mmHg — ABNORMAL LOW (ref 44–60)
pH, Ven: 7.469 — ABNORMAL HIGH (ref 7.25–7.43)
pO2, Ven: 80 mmHg — ABNORMAL HIGH (ref 32–45)

## 2024-03-01 LAB — CBG MONITORING, ED: Glucose-Capillary: 163 mg/dL — ABNORMAL HIGH (ref 70–99)

## 2024-03-01 MED ORDER — ASPIRIN 81 MG PO TBEC
81.0000 mg | DELAYED_RELEASE_TABLET | Freq: Every day | ORAL | Status: DC
  Administered 2024-03-01 – 2024-03-06 (×6): 81 mg via ORAL
  Filled 2024-03-01 (×6): qty 1

## 2024-03-01 MED ORDER — HEPARIN SODIUM (PORCINE) 5000 UNIT/ML IJ SOLN
5000.0000 [IU] | Freq: Two times a day (BID) | INTRAMUSCULAR | Status: DC
  Administered 2024-03-01 – 2024-03-06 (×10): 5000 [IU] via SUBCUTANEOUS
  Filled 2024-03-01 (×10): qty 1

## 2024-03-01 MED ORDER — SODIUM CHLORIDE 0.9 % IV SOLN: Freq: Once | INTRAVENOUS | Status: AC

## 2024-03-01 MED ORDER — ACETAMINOPHEN 325 MG PO TABS
650.0000 mg | ORAL_TABLET | ORAL | Status: DC | PRN
  Administered 2024-03-02 – 2024-03-05 (×9): 650 mg via ORAL
  Filled 2024-03-01 (×9): qty 2

## 2024-03-01 MED ORDER — ACETAMINOPHEN 650 MG RE SUPP: 650.0000 mg | RECTAL | Status: DC | PRN

## 2024-03-01 MED ORDER — LACTATED RINGERS IV BOLUS
500.0000 mL | Freq: Once | INTRAVENOUS | Status: AC
  Administered 2024-03-01: 500 mL via INTRAVENOUS

## 2024-03-01 MED ORDER — PANTOPRAZOLE SODIUM 40 MG PO TBEC
40.0000 mg | DELAYED_RELEASE_TABLET | Freq: Every day | ORAL | Status: DC
  Administered 2024-03-01 – 2024-03-06 (×6): 40 mg via ORAL
  Filled 2024-03-01 (×6): qty 1

## 2024-03-01 MED ORDER — AMLODIPINE BESYLATE 10 MG PO TABS
10.0000 mg | ORAL_TABLET | Freq: Every day | ORAL | Status: DC
  Administered 2024-03-01 – 2024-03-06 (×6): 10 mg via ORAL
  Filled 2024-03-01 (×6): qty 1

## 2024-03-01 MED ORDER — VENETOCLAX 100 MG PO TABS: 200.0000 mg | ORAL_TABLET | Freq: Every day | ORAL | Status: DC

## 2024-03-01 MED ORDER — SODIUM CHLORIDE 0.9 % IV SOLN: INTRAVENOUS | Status: DC

## 2024-03-01 MED ORDER — ACETAMINOPHEN 160 MG/5ML PO SOLN: 650.0000 mg | ORAL | Status: DC | PRN

## 2024-03-01 MED ORDER — BACLOFEN 10 MG PO TABS
20.0000 mg | ORAL_TABLET | Freq: Three times a day (TID) | ORAL | Status: DC
  Administered 2024-03-01 – 2024-03-02 (×2): 20 mg via ORAL
  Filled 2024-03-01 (×2): qty 2

## 2024-03-01 MED ORDER — STROKE: EARLY STAGES OF RECOVERY BOOK: Freq: Once | Status: DC

## 2024-03-01 MED ORDER — INSULIN ASPART 100 UNIT/ML IJ SOLN: 0.0000 [IU] | INTRAMUSCULAR | Status: DC | PRN

## 2024-03-01 MED ORDER — FLUOXETINE HCL 20 MG PO CAPS
40.0000 mg | ORAL_CAPSULE | Freq: Every day | ORAL | Status: DC
Start: 2024-03-01 — End: 2024-03-02
  Administered 2024-03-01 – 2024-03-02 (×2): 40 mg via ORAL
  Filled 2024-03-01 (×2): qty 2

## 2024-03-01 MED ORDER — ENSURE PLUS HIGH PROTEIN PO LIQD
237.0000 mL | Freq: Two times a day (BID) | ORAL | Status: DC
  Administered 2024-03-02 – 2024-03-04 (×6): 237 mL via ORAL

## 2024-03-01 MED ORDER — EZETIMIBE 10 MG PO TABS
10.0000 mg | ORAL_TABLET | Freq: Every day | ORAL | Status: DC
  Administered 2024-03-01 – 2024-03-06 (×6): 10 mg via ORAL
  Filled 2024-03-01 (×6): qty 1

## 2024-03-01 MED ORDER — ATORVASTATIN CALCIUM 80 MG PO TABS
80.0000 mg | ORAL_TABLET | Freq: Every day | ORAL | Status: DC
  Administered 2024-03-01 – 2024-03-06 (×6): 80 mg via ORAL
  Filled 2024-03-01 (×6): qty 1

## 2024-03-01 NOTE — ED Provider Notes (Signed)
  Physical Exam  BP (!) 157/114 (BP Location: Right Arm)   Pulse (!) 107   Temp 97.8 F (36.6 C) (Oral)   Resp 14   SpO2 100%   Physical Exam  Procedures  Procedures  ED Course / MDM   Clinical Course as of 03/01/24 1749  Fri Mar 01, 2024  1742 Bacteria, UA(!): RARE [JL]  1742 WBC, UA: 0-5 [JL]  1742 Leukocytes,Ua: NEGATIVE [JL]  1742 Nitrite: NEGATIVE [JL]    Clinical Course User Index [JL] Jerrol Agent, MD   Medical Decision Making Amount and/or Complexity of Data Reviewed Labs: ordered. Decision-making details documented in ED Course. Radiology: ordered.  Risk Decision regarding hospitalization.   43F, presenting with visual hallucinations and AMS. Had teeth pulled last Thursday. Seeing objects. Family states not at baseline.    Labs: Urinalysis without evidence of UTI, UDS negative, ethanol level normal, ammonia normal, CBG 163, CMP with slightly elevated creatinine to 1.18 with an elevated BUN to 25 from baseline of 0.9, CBC without a leukocytosis or anemia.  Chest x-ray without evidence of pneumothorax or pneumonia and CT head was unremarkable for acute intracranial abnormality.  On exam the patient appeared to have dry mucous membranes, her sister notes that she has had decreased oral intake over the past few days post dental work and she has been acutely confused, refused all of her medications at her facility this morning.  She is alert and oriented x 3 for me with persistent left-sided deficits on exam from her old stroke.  Considered frontal CVA or delirium from pain medication or anesthesia associated with her procedure.  Discussed with the patient Sister plan for admission for observation in the setting of her altered mental status.  Hospitalist medicine consulted for admission for observation.    Jerrol Agent, MD 03/01/24 [phone]

## 2024-03-01 NOTE — H&P (Signed)
 History and Physical    Patient: [name] FMW:[id number] DOB: 07/31/1960 DOA: 03/01/2024 DOS: the patient was seen and examined on 03/01/2024 . PCP: [name], DO  Patient coming from: Home Chief complaint: Chief Complaint  Patient presents with   Altered Mental Status   Hallucinations   HPI:  [name] is a 63 y.o. 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  Per report patient had 3 teeth extracted last week on Thursday and since then has been noted to have disorientation confusion.  Also reports cough.  And per report patient has been behaving not her usual baseline.  Patient also reported to have visual hallucinations. Spoke to ms brenda and states that she was hollering and h/o strokes and has not been eating or drinking and was refusing her meds.  ED Course:  Vital signs in the ED were notable for the following:  Vitals:   03/01/24 1324 03/01/24 1327 03/01/24 1551 03/01/24 1633  BP:  (!) 157/114  (!) 151/86  Pulse:  (!) 107  92  Temp:  97.8 F (36.6 C) 99 F (37.2 C) 97.9 F (36.6 C)  Resp:  14 (!) 22 17  SpO2: 98% 100%  100%  TempSrc:  Oral Rectal Oral   >>ED evaluation thus far shows: -Venous blood gas today shows pH of 7.4 pCO2 of 32.4 pO2 of 80. -Initial CMP shows normal potassium bicarb of 20 anion gap of 16 AKI with a creatinine of 1.18 eGFR 52 BUN of 25 normal LFTs except for bilirubin of 1.5. -CBC shows normal white count normal hemoglobin normal platelets. -Urinalysis today shows moderate hemoglobin clear urine 11-20 RBCs and 0-5  WBCs. -Ethyl alcohol less than 15. -Head CT negative today for any acute findings. -Chest x-ray done today shows no airspace disease effusion stable chest and no acute process. - EKG today shows sinus tach at 107 LVH, PR interval of 129, QTc 423, mild T wave inversions in lateral lead V5 and V6. - Last echo in 05/2022 shows:  1. Left ventricular ejection fraction, by estimation, is 60 to 65%. The  left ventricle has normal function. The left ventricle has no regional  wall motion abnormalities. There is mild concentric left ventricular  hypertrophy. Left ventricular diastolic  parameters are consistent with Grade I diastolic dysfunction (impaired  relaxation).   2. Right ventricular systolic function is normal. The right ventricular  size is normal. Tricuspid regurgitation signal is inadequate for assessing  PA pressure.   3. The mitral valve is grossly normal. No evidence of mitral valve  regurgitation. No evidence of mitral stenosis.   4. The aortic valve is tricuspid. Aortic valve regurgitation is not  visualized. No aortic stenosis is present.   5. The inferior vena cava is normal in size with greater than 50%  respiratory variability, suggesting right atrial pressure of 3 mmHg.   Conclusion(s)/Recommendation(s): No intracardiac source of embolism  detected on this transthoracic study. Consider a transesophageal  echocardiogram to exclude cardiac source of embolism if clinically  indicated.   >>While in the ED patient received the following: Medications  lactated ringers  bolus 500 mL (500 mLs Intravenous New Bag/Given 03/01/24 1818)   Review of Systems  Psychiatric/Behavioral:  Positive  for hallucinations.    Past Medical History:  Diagnosis Date   Abnormality of gait 05/10/2010   BACK PAIN 11/14/2008   Class 1 obesity due to excess calories with body mass index (BMI) of 31.0 to 31.9 in adult 02/07/2021   DIABETES MELLITUS, TYPE II 07/15/2008   Diplopia 07/15/2008    ECZEMA, ATOPIC 04/03/2009   GERD (gastroesophageal reflux disease)    Guillain-Barre 1988   HYPERLIPIDEMIA 03/06/2009   HYPERTENSION 07/15/2008   Stroke (HCC) 2010, 2011   x2    Vertebral artery stenosis    Past Surgical History:  Procedure Laterality Date   ABDOMINAL HYSTERECTOMY     BIOPSY  07/08/2022   Procedure: BIOPSY;  Surgeon: Leigh Elspeth SQUIBB, MD;  Location: [facility] ENDOSCOPY;  Service: Gastroenterology;;   BIOPSY  10/12/2022   Procedure: BIOPSY;  Surgeon: San Sandor GAILS, DO;  Location: [facility] ENDOSCOPY;  Service: Gastroenterology;;   COLONOSCOPY WITH PROPOFOL  N/A 07/08/2022   Procedure: COLONOSCOPY WITH PROPOFOL ;  Surgeon: Leigh Elspeth SQUIBB, MD;  Location: [facility] ENDOSCOPY;  Service: Gastroenterology;  Laterality: N/A;   DILATION AND CURETTAGE OF UTERUS     FLEXIBLE SIGMOIDOSCOPY N/A 10/12/2022   Procedure: FLEXIBLE SIGMOIDOSCOPY;  Surgeon: San Sandor GAILS, DO;  Location: [facility] ENDOSCOPY;  Service: Gastroenterology;  Laterality: N/A;   FOOT SURGERY     IR ANGIO INTRA EXTRACRAN SEL COM CAROTID INNOMINATE UNI L MOD SED  09/17/2021   IR ANGIO INTRA EXTRACRAN SEL INTERNAL CAROTID UNI R MOD SED  09/17/2021   IR ANGIO VERTEBRAL SEL VERTEBRAL UNI R MOD SED  09/17/2021   IR US  GUIDE VASC ACCESS RIGHT  09/17/2021    reports that she has never smoked. She has never used smokeless tobacco. She reports that she does not drink alcohol and does not use drugs. No Known Allergies Family History  Problem Relation Age of Onset   Diabetes Sister    Asthma Other    Stroke Other    Hypertension Other    Diabetes Mother    Stroke Mother    Cancer Father        pt states hae had some kind of stomach cancer, ? stomach or colon   Breast cancer Neg Hx    Prior to Admission medications   Medication Sig Start Date End Date Taking? Authorizing Provider  acetaminophen  (TYLENOL ) 325 MG tablet Take 650 mg by mouth every 4 (four) hours as needed for mild pain (pain score 1-3) or fever. Do not exceed 3000mg  in  24 hours   Yes [provider]  amLODipine  (NORVASC ) 10 MG tablet Take 1 tablet by mouth once daily 05/09/22  Yes Jordan, [name], MD  antiseptic oral rinse (BIOTENE) LIQD 15 mLs by Mouth Rinse route 3 (three) times daily.   Yes [provider]  aspirin  EC 81 MG tablet Take 1 tablet (81 mg total) by mouth daily. Swallow whole. 06/18/22  Yes Krishnan, Gokul, MD  atorvastatin  (LIPITOR ) 80 MG tablet Take 1 tablet (80 mg total) by mouth daily. 05/09/22  Yes Jordan, [name], MD  baclofen  (LIORESAL ) 20 MG tablet Take 20 mg by mouth 3 (three) times daily.   Yes [provider]  Benzocaine-Menthol  (ORAJEL 2X TOOTHACHE & GUM) 20-0.26 % GEL Use as directed 1 Application in the mouth or throat every 4 (four) hours as needed.   Yes [provider]  busPIRone (BUSPAR) 15 MG tablet Take 15 mg by mouth 3 (three) times daily.   Yes [provider]  cloNIDine (CATAPRES -  DOSED IN MG/24 HR) 0.1 mg/24hr patch Place 0.1 mg onto the skin every Saturday. 08/28/23  Yes [provider]  ezetimibe  (ZETIA ) 10 MG tablet Take 1 tablet (10 mg total) by mouth daily. 05/09/22  Yes Jordan, [name], MD  famotidine  (PEPCID ) 20 MG tablet Take 20 mg by mouth at bedtime.   Yes [provider]  FLUoxetine  (PROZAC ) 40 MG capsule Take 40 mg by mouth daily. 04/25/23  Yes [provider]  guaiFENesin  (ROBITUSSIN) 100 MG/5ML liquid Take 10 mLs by mouth every 4 (four) hours as needed for cough or to loosen phlegm.   Yes [provider]  hydrochlorothiazide  (HYDRODIURIL ) 12.5 MG tablet Take 12.5 mg by mouth daily. 09/05/23  Yes [provider]  hydrocortisone  cream 1 % Apply 1 Application topically 2 (two) times daily.   Yes [provider]  insulin  aspart (NOVOLOG ) 100 UNIT/ML injection Inject 0-10 Units into the skin 3 (three) times daily before meals. Sliding scale   Yes [provider]  insulin  glargine-yfgn (SEMGLEE , YFGN,) 100 UNIT/ML  injection Inject 20 Units into the skin daily.   Yes [provider]  lidocaine  (LIDODERM ) 5 % Place 1 patch onto the skin daily. Remove & Discard patch within 12 hours or as directed by MD 01/05/23  Yes Kommor, Madison, MD  losartan  (COZAAR ) 25 MG tablet Take 50 mg by mouth in the morning and at bedtime.   Yes [provider]  Menthol , Topical Analgesic, (BIOFREEZE COOL THE PAIN) 4 % GEL Apply 1 Application topically every 8 (eight) hours as needed.   Yes [provider]  metFORMIN  (GLUCOPHAGE ) 500 MG tablet Take 500 mg by mouth 2 (two) times daily with a meal.   Yes [provider]  methimazole (TAPAZOLE) 5 MG tablet Take 5 mg by mouth at bedtime.   Yes [provider]  pantoprazole  (PROTONIX ) 40 MG tablet Take 1 tablet (40 mg total) by mouth daily. 10/17/22 03/27/24 Yes Pahwani, Fredia, MD  senna-docusate (SENOKOT-S) 8.6-50 MG tablet Take 1 tablet by mouth daily as needed for mild constipation.   Yes [provider]  traMADol  HCl 25 MG TABS Take 25 mg by mouth every 6 (six) hours as needed (for moderate pain). 11/16/22  Yes [provider]  venetoclax  (VENCLEXTA ) 100 MG tablet Take 2 tablets (200 mg total) by mouth daily. Tablets should be swallowed whole with a meal and a full glass of water. 02/28/24 03/29/24 Yes Onesimo Emaline Brink, MD  ONE TOUCH LANCETS MISC USE TO CHECK BLOOD SUGAR TWICE A DAY AND PRN 07/29/15   Jame Maude FALCON, MD  OXYGEN Inhale 2 L/min into the lungs as needed. Patient not taking: Reported on 05/31/2023    [provider]                                                                                 Vitals:   03/01/24 1324 03/01/24 1327 03/01/24 1551 03/01/24 1633  BP:  (!) 157/114  (!) 151/86  Pulse:  (!) 107  92  Resp:  14 (!) 22 17  Temp:  97.8 F (36.6 C) 99 F (37.2 C) 97.9 F (36.6 C)  TempSrc:  Oral  Rectal Oral  SpO2: 98% 100%  100%   Physical Exam Vitals reviewed.  Constitutional:       General: She is not in acute distress.    Appearance: She is not ill-appearing.  HENT:     Head: Normocephalic.  Eyes:     Extraocular Movements: Extraocular movements intact.  Cardiovascular:     Rate and Rhythm: Normal rate and regular rhythm.     Heart sounds: Normal heart sounds.  Pulmonary:     Effort: Pulmonary effort is normal.     Breath sounds: Normal breath sounds.  Abdominal:     General: There is no distension.     Palpations: Abdomen is soft.     Tenderness: There is no abdominal tenderness.  Neurological:     Mental Status: She is alert and oriented to person, place, and time.     Motor: Weakness present.     Labs on Admission: I have personally reviewed following labs and imaging studies CBC: Recent Labs  Lab 03/01/24 1505 03/01/24 1519  WBC 5.3  --   NEUTROABS 3.7  --   HGB 12.3 11.9*  HCT 37.7 35.0*  MCV 83.2  --   PLT 255  --    Basic Metabolic Panel: Recent Labs  Lab 03/01/24 1505 03/01/24 1519  NA 140 142  K 4.4 4.4  CL 104  --   CO2 20*  --   GLUCOSE 157*  --   BUN 25*  --   CREATININE 1.18*  --   CALCIUM  10.0  --    GFR: CrCl cannot be calculated (Unknown ideal weight.). Liver Function Tests: Recent Labs  Lab 03/01/24 1505  AST 24  ALT 22  ALKPHOS 69  BILITOT 1.5*  PROT 7.2  ALBUMIN 3.2*   No results for input(s): LIPASE, AMYLASE in the last 168 hours. Recent Labs  Lab 03/01/24 1505  AMMONIA 23   Recent Labs    03/04/23 2323 03/13/23 0932 03/28/23 0044 05/29/23 0943 07/26/23 0851 12/06/23 1005 03/01/24 1505  BUN 25* 25* 29* 33* 35* 29* 25*  CREATININE 0.90 0.91 0.95 0.93 0.89 0.91 1.18*    Cardiac Enzymes: No results for input(s): CKTOTAL, CKMB, CKMBINDEX, TROPONINI in the last 168 hours. BNP (last 3 results) No results for input(s): PROBNP in the last 8760 hours. HbA1C: No results for input(s): HGBA1C in the last 72 hours. CBG: Recent Labs  Lab 03/01/24 1509  GLUCAP 163*   Lipid  Profile: No results for input(s): CHOL, HDL, LDLCALC, TRIG, CHOLHDL, LDLDIRECT in the last 72 hours. Thyroid  Function Tests: No results for input(s): TSH, T4TOTAL, FREET4, T3FREE, THYROIDAB in the last 72 hours. Anemia Panel: No results for input(s): VITAMINB12, FOLATE, FERRITIN, TIBC, IRON, RETICCTPCT in the last 72 hours. Urine analysis:    Component Value Date/Time   COLORURINE YELLOW 03/01/2024 1341   APPEARANCEUR CLEAR 03/01/2024 1341   LABSPEC 1.012 03/01/2024 1341   PHURINE 5.0 03/01/2024 1341   GLUCOSEU 50 (A) 03/01/2024 1341   HGBUR MODERATE (A) 03/01/2024 1341   HGBUR negative 03/12/2010 0847   BILIRUBINUR NEGATIVE 03/01/2024 1341   BILIRUBINUR n 09/11/2015 1039   KETONESUR 20 (A) 03/01/2024 1341   PROTEINUR >=300 (A) 03/01/2024 1341   UROBILINOGEN 1.0 09/11/2015 1039   UROBILINOGEN 1.0 05/19/2014 1858   NITRITE NEGATIVE 03/01/2024 1341   LEUKOCYTESUR NEGATIVE 03/01/2024 1341   Radiological Exams on Admission: DG Chest Portable 1 View Result Date: 03/01/2024 CLINICAL DATA:  Cough, recent dental procedure, hallucinations EXAM: PORTABLE CHEST 1  VIEW COMPARISON:  03/28/2023 FINDINGS: Single frontal view of the chest demonstrates a stable cardiac silhouette. Linear scarring at the left lung base unchanged. No airspace disease, effusion, or pneumothorax. No acute bony abnormalities. IMPRESSION: 1. Stable chest, no acute process. Electronically Signed   By: Ozell Daring M.D.   On: 03/01/2024 15:28   CT HEAD WO CONTRAST Result Date: 03/01/2024 EXAM: CT HEAD WITHOUT CONTRAST 03/01/2024 02:15:00 PM TECHNIQUE: CT of the head was performed without the administration of intravenous contrast. Automated exposure control, iterative reconstruction, and/or weight based adjustment of the mA/kV was utilized to reduce the radiation dose to as low as reasonably achievable. COMPARISON: 01/05/2023 CLINICAL HISTORY: Mental status change, unknown cause. FINDINGS:  BRAIN AND VENTRICLES: No acute hemorrhage. No evidence of acute infarct. No hydrocephalus. No extra-axial collection. No mass effect or midline shift. Calcified atherosclerotic plaque within cavernous/supraclinoid internal carotid arteries. ORBITS: No acute abnormality. SINUSES: No acute abnormality. SOFT TISSUES AND SKULL: No acute soft tissue abnormality. No skull fracture. IMPRESSION: 1. No acute intracranial abnormality. 2. Intracranial internal carotid artery atherosclerosis with calcified plaque within the cavernous and supraclinoid segments. Electronically signed by: Franky Stanford MD 03/01/2024 03:02 PM EDT RP Workstation: [id number]   Data Reviewed: Relevant notes from primary care and specialist visits, past discharge summaries as available in EHR, including Care Everywhere . Prior diagnostic testing as pertinent to current admission diagnoses, Updated medications and problem lists for reconciliation .ED course, including vitals, labs, imaging, treatment and response to treatment,Triage notes, nursing and pharmacy notes and ED provider's notes.Notable results as noted in HPI.Discussed case with EDMD/ ED APP/ or Specialty MD on call and as needed.  Assessment & Plan   >> Disorientation/visual hallucination: Since last Thursday sister thought it was from Novocain.  There is some history of medication's being mixed up and not sure if this is related to patient's meds as she has been refusing her home meds or something else. We will get MRI to identify CVA. Blood and urine cultures.   >>H/O CVA/baseline left-sided weakness: Patient has had history of multiple strokes in 2010 2022 2023 and bilateral medullary stroke and left cerebellar stroke and has been on aspirin  and Plavix  Plavix  and Pletal  and aspirin  and Brilinta  currently is on aspirin  81 and diabetes and cholesterol risk factors are well-controlled per neurology note, Aspiration and fall precautions. We will however obtain an MRI of the brain  Noncon.  PT consult per a.m. team prior to discharge.   >> Small lymphocytic lymphoma/anemia: Will continue patient on venetoclax  daily.   >> AKI: Lab Results  Component Value Date   CREATININE 1.18 (H) 03/01/2024   CREATININE 0.91 12/06/2023   CREATININE 0.89 07/26/2023  Gentle IVF hydration.   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 We will follow.    >> Anion gap metabolic acidosis: Lactic acid is pending. No fever/ Normal wbc/ normal urine.    DVT prophylaxis:  Heparin   Consults:  None   Advance Care Planning:    Code Status: Full Code   Family Communication:  Sister. Disposition Plan:  SNF  Severity of Illness: The appropriate patient status for this patient is OBSERVATION. Observation status is judged to be reasonable and necessary in order to provide the required intensity of service to ensure the patient's safety. The patient's presenting symptoms, physical exam findings, and initial radiographic and laboratory data in the context of their medical condition is felt to place them at decreased risk for further clinical deterioration. Furthermore, it is anticipated that the patient will be medically stable for discharge from the hospital within 2 midnights of admission.   Unresulted Labs (From admission, onward)     Start     Ordered   03/02/24 0500  Lipid panel  (Labs)  Tomorrow morning,   R       Comments: Fasting    03/01/24 1908   03/02/24 0500  CBC with Differential/Platelet  Tomorrow morning,   R        03/01/24 1910   03/02/24 0500  Comprehensive metabolic panel with GFR  Tomorrow morning,   R        03/01/24 1910   03/02/24 0500  Magnesium   Tomorrow morning,   R        03/01/24 1910   03/02/24  0500  Phosphorus  Tomorrow morning,   R        03/01/24 1910   03/01/24 1907  HIV Antibody (routine testing w rflx)  (HIV Antibody (Routine testing w reflex) panel)  Once,   R        03/01/24 1908   03/01/24 1907  Urine rapid drug screen (hosp performed)not at [facility])  Once,   R        03/01/24 1908   03/01/24 1904  T4, free  Add-on,   AD        03/01/24 1904   03/01/24 1904  TSH  Add-on,   AD        03/01/24 1904   03/01/24 1904  Hemoglobin A1c  Once,   URGENT       Comments: To assess prior glycemic control    03/01/24 1904   03/01/24 1900  CK  Add-on,   AD        03/01/24 1900   03/01/24 1847  Magnesium   Add-on,   AD        03/01/24 1846   03/01/24 1644  Resp panel by RT-PCR (RSV, Flu A&B, Covid) Anterior Nasal Swab  Once,   URGENT        03/01/24 1643            Meds ordered this encounter  Medications   lactated ringers  bolus 500 mL   amLODipine  (NORVASC ) tablet 10 mg   aspirin  EC tablet 81 mg   atorvastatin  (LIPITOR ) tablet 80 mg   baclofen  (LIORESAL ) tablet 20 mg   FLUoxetine  (PROZAC ) capsule 40 mg   venetoclax  (VENCLEXTA ) tablet 200 mg   pantoprazole  (PROTONIX ) EC tablet 40 mg   insulin  aspart (novoLOG ) injection 0-9 Units    Correction coverage::   Sensitive (thin, NPO, renal)    CBG < 70::   Implement Hypoglycemia Standing Orders and refer to Hypoglycemia Standing Orders sidebar report    CBG 70 - 120::   0 units    CBG 121 - 150::   1 unit    CBG 151 - 200::   2 units    CBG 201 - 250::   3 units    CBG 251 - 300::   5 units    CBG 301 -  350::   7 units    CBG 351 - 400:   9 units    CBG > 400:   call MD and obtain STAT lab verification   ezetimibe  (ZETIA ) tablet 10 mg   0.9 %  sodium chloride  infusion   DISCONTD:  stroke: early stages of recovery book   DISCONTD: 0.9 %  sodium chloride  infusion   OR Linked Order Group    acetaminophen  (TYLENOL ) tablet 650 mg    acetaminophen  (TYLENOL ) 160 MG/5ML solution 650 mg    acetaminophen  (TYLENOL )  suppository 650 mg   heparin  injection 5,000 Units     Orders Placed This Encounter  Procedures   Resp panel by RT-PCR (RSV, Flu A&B, Covid) Anterior Nasal Swab   DG Chest Portable 1 View   CT HEAD WO CONTRAST   MR BRAIN WO CONTRAST   Comprehensive metabolic panel   CBC with Differential/Platelet   Urinalysis, Routine w reflex microscopic -Urine, Clean Catch   Ammonia   Rapid urine drug screen (hospital performed)   Ethanol   Magnesium    CK   T4, free   TSH   Hemoglobin A1c   HIV Antibody (routine testing w rflx)   Urine rapid drug screen (hosp performed)not at [facility]   Lipid panel   CBC with Differential/Platelet   Comprehensive metabolic panel with GFR   Magnesium    Phosphorus   Diet NPO time specified   ED Cardiac monitoring   Initiate Carrier Fluid Protocol   In and Out Cath   Check Rectal Temperature   Apply Diabetes Mellitus Care Plan   STAT CBG when hypoglycemia is suspected. If treated, recheck every 15 minutes after each treatment until CBG >/= 70 mg/dl   Refer to Hypoglycemia Protocol Sidebar Report for treatment of CBG < 70 mg/dl   No HS correction Insulin    Strict intake and output   NIHSS score documentation NIHSS score range: 0-42   Vital signs   Notify physician (specify)   OOB with assistance   Activity as tolerated   Swallow screen - If patient does NOT pass this screen, place order for SLP eval and treat (SLP2) - swallowing evaluation (BSE, MBS and/or diet order as indicated)   NIH Stroke Scale   Intake and output   Cardiac Monitoring Continuous x 24 hours Indications for use: Acute neurological event   Apply Stroke Care Plan: Ischemic Stroke, TIA   Discuss with patient and document patient's goals for stroke risk factor reduction   Initiate Oral Care Protocol   Initiate Carrier Fluid Protocol   Provide stroke education material to patient and family.   Nurse to provide smoking / tobacco cessation education   If the patient has passed the Stroke  Swallow Screen or has a feeding tube, then RN may order General Admission PRN Orders (through manage orders) for the following patient needs: allergy symptoms (Claritin), cold sores (Carmex), cough (Robitussin DM), eye irritation (Liquifilm Tears), hemorrhoids (Tucks), indigestion (Maalox), minor skin irritation (hydrocortisone  cream), muscle pain Lucienne Gay), nose irritation (saline nasal spray) and sore throat (Chloraseptic spray).   Swallow screen   Full code   Consult to hospitalist   Consult to Registered Dietitian   Consult to Transition of Care Team   OT eval and treat   PT eval and treat   ED Pulse oximetry, continuous   Oxygen therapy Mode or (Route): Nasal cannula; Liters Per Minute: 2; Keep O2 saturation between: greater than 94 %   SLP eval and treat Reason  for evaluation: Cognitive/Language evaluation   CBG monitoring, ED   I-Stat venous blood gas, ED   I-Stat CG4 Lactic Acid   EKG 12-Lead   Insert peripheral IV   Place in observation (patient's expected length of stay will be less than 2 midnights)   Fall precautions   Aspiration precautions    Author: Mario LULLA Blanch, MD 12 pm- 8 pm. [facility]. 03/01/2024 7:11 PM Please note for any communication after hours contact [facility] Assigned provider on call on Amion.

## 2024-03-01 NOTE — ED Provider Notes (Signed)
 [place] EMERGENCY DEPARTMENT AT [facility] Provider Note   CSN: [id number] Arrival date & time: 03/01/24  1323     Patient presents with: Altered Mental Status and Hallucinations   Maryah Marinaro is a 63 y.o. female.    Altered Mental Status   63 year old female presents emergency department with concern for hallucinations.  Patient states that she does sometimes see objects in her vision.  States that this has been happening more since she had her tooth pulled this past Thursday.  Patient thinks that she may have had her medicines mixed up.  Denies any fevers, chills, abdominal pain, nausea, vomiting, urinary symptoms, change in bowel habits.  Does report having cough for the past week or so.  Per assisted living facility ever since dental procedure on Thursday, patient has been expressing abnormal behavior.  Has been talking as if she is going to pass away or something bad is going to happen to her.  Has not been having more religious hallucinations which are abnormal for her.  Patient also reported seeing objects in the room that are actually there.  Facility staff thought that it could be secondary to the Novocain or other anesthetic medication she was given that symptoms have worsened over the past week or so.  Denies any oral medication change.    Past medical history significant for hyperlipidemia, diabetes mellitus, stroke, vertebral artery stenosis, left-sided hemiparesis, diabetic neuropathy, small lymphocytic lymphoma, GERD  Prior to Admission medications   Medication Sig Start Date End Date Taking? Authorizing Provider  acetaminophen  (TYLENOL ) 325 MG tablet Take 650 mg by mouth every 4 (four) hours as needed for mild pain (pain score 1-3) or fever. Do not exceed 3000mg  in 24 hours    [provider]  allopurinol  (ZYLOPRIM ) 100 MG tablet Take 1 tablet (100 mg total) by mouth 2 (two) times daily. Patient not taking: Reported on 10/10/2023 04/21/23   Onesimo Emaline Brink, MD  amLODipine  (NORVASC ) 10 MG tablet Take 1 tablet by mouth once daily 05/09/22   Jordan, [name], MD  antiseptic oral rinse (BIOTENE) LIQD 15 mLs by Mouth Rinse route 3 (three) times daily.    [provider]  aspirin  EC 81 MG tablet Take 1 tablet (81 mg total) by mouth daily. Swallow whole. 06/18/22   Krishnan, Gokul, MD  atorvastatin  (LIPITOR ) 80 MG tablet Take 1 tablet (80 mg total) by mouth daily. 05/09/22   Jordan, [name], MD  baclofen  (LIORESAL ) 10 MG tablet Take 1.5 tablets (15 mg total) by mouth 3 (three) times daily. 10/10/23   Patel, Donika K, DO  busPIRone (BUSPAR) 5 MG tablet Take 5 mg by mouth 3 (three) times daily.    [provider]  cloNIDine (CATAPRES - DOSED IN MG/24 HR) 0.1 mg/24hr patch 0.1 mg once a week. 08/28/23   [provider]  ezetimibe  (ZETIA ) 10 MG tablet Take 1 tablet (10 mg total) by mouth daily. 05/09/22   Jordan, [name], MD  famotidine  (PEPCID ) 20 MG tablet Take 20 mg by mouth daily.    [provider]  FLUoxetine  (PROZAC ) 40 MG capsule Take 40 mg by mouth daily. 04/25/23   [provider]  hydrochlorothiazide  (HYDRODIURIL ) 12.5 MG tablet Take 12.5 mg by mouth daily. 09/05/23   [provider]  hydrocortisone  cream 1 % Apply 1 Application topically 2 (two) times daily.    [provider]  LANTUS  SOLOSTAR 100 UNIT/ML Solostar Pen Inject 5 Units into the skin daily. Patient taking  differently: Inject 10 Units into the skin daily. 10/17/22   Vernon Ranks, MD  lidocaine  (LIDODERM ) 5 % Place 1 patch onto the skin daily. Remove & Discard patch within 12 hours or as directed by MD 01/05/23   Kommor, Lum, MD  losartan  (COZAAR ) 25 MG tablet Take 25 mg by mouth daily.    [provider]  mesalamine  (CANASA ) 1000 MG suppository Place 1 suppository (1,000 mg total) rectally 2 (two) times daily. Patient not taking: Reported on 05/31/2023 10/17/22   Vernon Ranks, MD  ondansetron  (ZOFRAN ) 8 MG  tablet Take 1 tablet (8 mg total) by mouth every 8 (eight) hours as needed for nausea or vomiting. Patient not taking: Reported on 05/31/2023 03/19/23   Onesimo Emaline Brink, MD  ONE [facility] LANCETS MISC USE TO CHECK BLOOD SUGAR TWICE A DAY AND PRN 07/29/15   Jame Maude FALCON, MD  OXYGEN Inhale 2 L/min into the lungs as needed. Patient not taking: Reported on 05/31/2023    [provider]  pantoprazole  (PROTONIX ) 40 MG tablet Take 1 tablet (40 mg total) by mouth daily. 10/17/22 03/27/24  Vernon Ranks, MD  senna-docusate (SENOKOT-S) 8.6-50 MG tablet Take 1 tablet by mouth daily as needed for mild constipation. Patient not taking: Reported on 05/31/2023    [provider]  traMADol  HCl 25 MG TABS Take 50 mg by mouth every 6 (six) hours as needed (for moderate pain). 11/16/22   [provider]  venetoclax  (VENCLEXTA ) 100 MG tablet Take 2 tablets (200 mg total) by mouth daily. Tablets should be swallowed whole with a meal and a full glass of water. 02/28/24 03/29/24  Onesimo Emaline Brink, MD    Allergies: Patient has no known allergies.    Review of Systems  All other systems reviewed and are negative.   Updated Vital Signs BP (!) 157/114 (BP Location: Right Arm)   Pulse (!) 107   Temp 97.8 F (36.6 C) (Oral)   Resp 14   SpO2 100%   Physical Exam Vitals and nursing note reviewed.  Constitutional:      General: She is not in acute distress.    Appearance: She is well-developed.  HENT:     Head: Normocephalic and atraumatic.  Eyes:     Conjunctiva/sclera: Conjunctivae normal.  Cardiovascular:     Rate and Rhythm: Normal rate and regular rhythm.     Heart sounds: No murmur heard. Pulmonary:     Effort: Pulmonary effort is normal. No respiratory distress.     Breath sounds: Normal breath sounds.  Abdominal:     Palpations: Abdomen is soft.     Tenderness: There is no abdominal tenderness.  Musculoskeletal:        General: No swelling.     Cervical back:  Neck supple.  Skin:    General: Skin is warm and dry.     Capillary Refill: Capillary refill takes less than 2 seconds.  Neurological:     Mental Status: She is alert.     Comments: Alert and oriented to self, place, and event.   Dysarthria present.  Weakness left side greater than right. Sensation intact in upper/lower extremities   Gait not assessed CN I not tested  CN II grossly intact visual fields bilaterally. Did not visualize posterior eye.  CN III, IV, VI PERRLA and EOMs intact bilaterally  CN V Intact sensation to sharp and light touch to the face  CN VII facial movements symmetric  CN VIII not tested  CN IX, X  no uvula deviation, symmetric rise of soft palate  CN XI SCM and trapezius strength bilaterally  CN XII Midline tongue protrusion, symmetric L/R movements     Psychiatric:        Mood and Affect: Mood normal.     (all labs ordered are listed, but only abnormal results are displayed) Labs Reviewed  COMPREHENSIVE METABOLIC PANEL WITH GFR  CBC WITH DIFFERENTIAL/PLATELET  URINALYSIS, ROUTINE W REFLEX MICROSCOPIC  AMMONIA  RAPID URINE DRUG SCREEN, HOSP PERFORMED  ETHANOL  CBG MONITORING, ED  I-STAT VENOUS BLOOD GAS, ED    EKG: None  Radiology: No results found.   Procedures   Medications Ordered in the ED - No data to display                                  Medical Decision Making Amount and/or Complexity of Data Reviewed Labs: ordered. Radiology: ordered.   This patient presents to the ED for concern of altered mentation, this involves an extensive number of treatment options, and is a complaint that carries with it a high risk of complications and morbidity.  The differential diagnosis includes CVA, metabolic encephalopathy, infectious origin, medication side effect, drug ingestion/withdrawal, other   Co morbidities that complicate the patient evaluation  See HPI   Additional history obtained:  Additional history obtained from  EMR External records from outside source obtained and reviewed including hospital records   Lab Tests:  Laboratory studies pending.  Imaging Studies ordered:  I ordered imaging studies including chest x-ray, CT head which are pending at shift change.   Cardiac Monitoring: / EKG:  The patient was maintained on a cardiac monitor.  I personally viewed and interpreted the cardiac monitored which showed an underlying rhythm of: Sinus rhythm   Consultations Obtained:  See ED course  Problem List / ED Course / Critical interventions / Medication management  Altered mentation Reevaluation of the patient showed that the patient stayed the same I have reviewed the patients home medicines and have made adjustments as needed   Social Determinants of Health:  Denies tobacco, illicit drug use.   Test / Admission - Considered:  Altered mentation Vitals signs significant for hypertension. Otherwise within normal range and stable throughout visit. Laboratory/imaging studies significant for: See above 63 year old female presents emergency department with concern for hallucinations.  Patient states that she does sometimes see objects in her vision.  States that this has been happening more since she had her tooth pulled this past Thursday.  Patient thinks that she may have had her medicines mixed up.  Denies any fevers, chills, abdominal pain, nausea, vomiting, urinary symptoms, change in bowel habits.  Does report having cough for the past week or so.  Per assisted living facility ever since dental procedure on Thursday, patient has been expressing abnormal behavior.  Has been talking as if she is going to pass away or something bad is going to happen to her.  Has not been having more religious hallucinations which are abnormal for her.  Patient also reported seeing objects in the room that are actually there.  Facility staff thought that it could be secondary to the Novocain or other anesthetic  medication she was given that symptoms have worsened over the past week or so.  Denies any oral medication change.   On exam, patient with baseline dysarthria, left arm weakness, bilateral leg weakness is at baseline per prior strokes.  No  real acute abnormalities otherwise on physical exam appreciable.  Pending labs as well as imaging studies at time of shift change.  Unsure of exact etiology of patient's altered mentation.  Suspect patient likely to be admitted.  At time of shift change, patient care handed off to Dr. Jerrol.  Patient stable upon shift change.      Final diagnoses:  None    ED Discharge Orders     None          Silver Wonda LABOR, [place] 03/01/24 1527    Jerrol Agent, MD 03/01/24 [phone]

## 2024-03-01 NOTE — Hospital Course (Signed)
 SABRA

## 2024-03-01 NOTE — ED Triage Notes (Signed)
 Pt had some teeth pulled last week (Thursday) and staff says since then she has been altered and having visual hallucinations.

## 2024-03-02 ENCOUNTER — Observation Stay (HOSPITAL_COMMUNITY)

## 2024-03-02 DIAGNOSIS — R569 Unspecified convulsions: Secondary | ICD-10-CM | POA: Diagnosis not present

## 2024-03-02 DIAGNOSIS — R410 Disorientation, unspecified: Secondary | ICD-10-CM | POA: Diagnosis not present

## 2024-03-02 DIAGNOSIS — R4182 Altered mental status, unspecified: Secondary | ICD-10-CM

## 2024-03-02 LAB — CBC WITH DIFFERENTIAL/PLATELET
Abs Immature Granulocytes: 0.02 K/uL (ref 0.00–0.07)
Abs Immature Granulocytes: 0.04 K/uL (ref 0.00–0.07)
Basophils Absolute: 0 K/uL (ref 0.0–0.1)
Basophils Absolute: 0 K/uL (ref 0.0–0.1)
Basophils Relative: 1 %
Basophils Relative: 0 %
Eosinophils Absolute: 0.1 K/uL (ref 0.0–0.5)
Eosinophils Absolute: 0.1 K/uL (ref 0.0–0.5)
Eosinophils Relative: 2 %
Eosinophils Relative: 2 %
HCT: 38.3 % (ref 36.0–46.0)
HCT: 37.7 % (ref 36.0–46.0)
Hemoglobin: 12.4 g/dL (ref 12.0–15.0)
Hemoglobin: 12.8 g/dL (ref 12.0–15.0)
Immature Granulocytes: 0 %
Immature Granulocytes: 1 %
Lymphocytes Relative: 25 %
Lymphocytes Relative: 28 %
Lymphs Abs: 1.4 K/uL (ref 0.7–4.0)
Lymphs Abs: 1.6 K/uL (ref 0.7–4.0)
MCH: 27 pg (ref 26.0–34.0)
MCH: 27.8 pg (ref 26.0–34.0)
MCHC: 32.4 g/dL (ref 30.0–36.0)
MCHC: 34 g/dL (ref 30.0–36.0)
MCV: 83.4 fL (ref 80.0–100.0)
MCV: 81.8 fL (ref 80.0–100.0)
Monocytes Absolute: 0.8 K/uL (ref 0.1–1.0)
Monocytes Absolute: 0.7 K/uL (ref 0.1–1.0)
Monocytes Relative: 14 %
Monocytes Relative: 12 %
Neutro Abs: 3.3 K/uL (ref 1.7–7.7)
Neutro Abs: 3.3 K/uL (ref 1.7–7.7)
Neutrophils Relative %: 58 %
Neutrophils Relative %: 57 %
Platelets: 238 K/uL (ref 150–400)
Platelets: 254 K/uL (ref 150–400)
RBC: 4.59 MIL/uL (ref 3.87–5.11)
RBC: 4.61 MIL/uL (ref 3.87–5.11)
RDW: 14.7 % (ref 11.5–15.5)
RDW: 14.5 % (ref 11.5–15.5)
WBC: 5.5 K/uL (ref 4.0–10.5)
WBC: 5.8 K/uL (ref 4.0–10.5)
nRBC: 0 % (ref 0.0–0.2)
nRBC: 0 % (ref 0.0–0.2)

## 2024-03-02 LAB — CK: Total CK: 91 U/L (ref 38–234)

## 2024-03-02 LAB — COMPREHENSIVE METABOLIC PANEL WITH GFR
ALT: 25 U/L (ref 0–44)
AST: 24 U/L (ref 15–41)
Albumin: 3.2 g/dL — ABNORMAL LOW (ref 3.5–5.0)
Alkaline Phosphatase: 68 U/L (ref 38–126)
Anion gap: 15 (ref 5–15)
BUN: 26 mg/dL — ABNORMAL HIGH (ref 8–23)
CO2: 22 mmol/L (ref 22–32)
Calcium: 10.3 mg/dL (ref 8.9–10.3)
Chloride: 104 mmol/L (ref 98–111)
Creatinine, Ser: 1.06 mg/dL — ABNORMAL HIGH (ref 0.44–1.00)
GFR, Estimated: 59 mL/min — ABNORMAL LOW (ref >60)
Glucose, Bld: 138 mg/dL — ABNORMAL HIGH (ref 70–99)
Potassium: 3.7 mmol/L (ref 3.5–5.1)
Sodium: 141 mmol/L (ref 135–145)
Total Bilirubin: 1.1 mg/dL (ref 0.0–1.2)
Total Protein: 7.2 g/dL (ref 6.5–8.1)

## 2024-03-02 LAB — BASIC METABOLIC PANEL WITH GFR
Anion gap: 15 (ref 5–15)
BUN: 26 mg/dL — ABNORMAL HIGH (ref 8–23)
CO2: 24 mmol/L (ref 22–32)
Calcium: 10.4 mg/dL — ABNORMAL HIGH (ref 8.9–10.3)
Chloride: 101 mmol/L (ref 98–111)
Creatinine, Ser: 1.13 mg/dL — ABNORMAL HIGH (ref 0.44–1.00)
GFR, Estimated: 54 mL/min — ABNORMAL LOW (ref >60)
Glucose, Bld: 137 mg/dL — ABNORMAL HIGH (ref 70–99)
Potassium: 3.7 mmol/L (ref 3.5–5.1)
Sodium: 140 mmol/L (ref 135–145)

## 2024-03-02 LAB — PHOSPHORUS: Phosphorus: 2.8 mg/dL (ref 2.5–4.6)

## 2024-03-02 LAB — RAPID URINE DRUG SCREEN, HOSP PERFORMED
Amphetamines: NOT DETECTED
Barbiturates: NOT DETECTED
Benzodiazepines: NOT DETECTED
Cocaine: NOT DETECTED
Opiates: NOT DETECTED
Tetrahydrocannabinol: NOT DETECTED

## 2024-03-02 LAB — MAGNESIUM
Magnesium: 1.8 mg/dL (ref 1.7–2.4)
Magnesium: 1.8 mg/dL (ref 1.7–2.4)
Magnesium: 1.9 mg/dL (ref 1.7–2.4)

## 2024-03-02 LAB — HIV ANTIBODY (ROUTINE TESTING W REFLEX): HIV Screen 4th Generation wRfx: NONREACTIVE

## 2024-03-02 LAB — LIPID PANEL
Cholesterol: 132 mg/dL (ref 0–200)
HDL: 44 mg/dL (ref >40)
LDL Cholesterol: 69 mg/dL (ref 0–99)
Total CHOL/HDL Ratio: 3 ratio
Triglycerides: 95 mg/dL (ref <150)
VLDL: 19 mg/dL (ref 0–40)

## 2024-03-02 LAB — GLUCOSE, CAPILLARY
Glucose-Capillary: 105 mg/dL — ABNORMAL HIGH (ref 70–99)
Glucose-Capillary: 241 mg/dL — ABNORMAL HIGH (ref 70–99)
Glucose-Capillary: 361 mg/dL — ABNORMAL HIGH (ref 70–99)
Glucose-Capillary: 284 mg/dL — ABNORMAL HIGH (ref 70–99)

## 2024-03-02 LAB — AMMONIA: Ammonia: 14 umol/L (ref 9–35)

## 2024-03-02 LAB — T4, FREE: Free T4: 0.89 ng/dL (ref 0.61–1.12)

## 2024-03-02 LAB — C-REACTIVE PROTEIN: CRP: 0.6 mg/dL (ref <1.0)

## 2024-03-02 LAB — TSH: TSH: 3.338 u[IU]/mL (ref 0.350–4.500)

## 2024-03-02 LAB — PROCALCITONIN: Procalcitonin: 1.8 ng/mL

## 2024-03-02 LAB — VITAMIN B12: Vitamin B-12: 2220 pg/mL — ABNORMAL HIGH (ref 180–914)

## 2024-03-02 MED ORDER — IOHEXOL 350 MG/ML SOLN
75.0000 mL | Freq: Once | INTRAVENOUS | Status: AC | PRN
  Administered 2024-03-02: 75 mL via INTRAVENOUS

## 2024-03-02 MED ORDER — METHIMAZOLE 5 MG PO TABS
5.0000 mg | ORAL_TABLET | Freq: Every day | ORAL | Status: DC
  Administered 2024-03-02 – 2024-03-05 (×4): 5 mg via ORAL
  Filled 2024-03-02 (×4): qty 1

## 2024-03-02 MED ORDER — MELATONIN 3 MG PO TABS
3.0000 mg | ORAL_TABLET | Freq: Every evening | ORAL | Status: DC | PRN
  Administered 2024-03-02 – 2024-03-05 (×3): 3 mg via ORAL
  Filled 2024-03-02 (×3): qty 1

## 2024-03-02 MED ORDER — HYDRALAZINE HCL 50 MG PO TABS
50.0000 mg | ORAL_TABLET | Freq: Three times a day (TID) | ORAL | Status: DC
  Administered 2024-03-02 – 2024-03-06 (×10): 50 mg via ORAL
  Filled 2024-03-02 (×12): qty 1

## 2024-03-02 MED ORDER — BACLOFEN 5 MG HALF TABLET
5.0000 mg | ORAL_TABLET | Freq: Three times a day (TID) | ORAL | Status: DC
  Administered 2024-03-03 – 2024-03-06 (×10): 5 mg via ORAL
  Filled 2024-03-02 (×10): qty 1

## 2024-03-02 MED ORDER — FLUOXETINE HCL 10 MG PO CAPS
10.0000 mg | ORAL_CAPSULE | Freq: Every day | ORAL | Status: DC
Start: 2024-03-03 — End: 2024-03-06
  Administered 2024-03-03 – 2024-03-06 (×4): 10 mg via ORAL
  Filled 2024-03-02 (×5): qty 1

## 2024-03-02 MED ORDER — INSULIN GLARGINE-YFGN 100 UNIT/ML ~~LOC~~ SOLN
10.0000 [IU] | Freq: Every day | SUBCUTANEOUS | Status: DC
  Administered 2024-03-02 – 2024-03-03 (×2): 10 [IU] via SUBCUTANEOUS
  Filled 2024-03-02 (×3): qty 0.1

## 2024-03-02 MED ORDER — HYDRALAZINE HCL 50 MG PO TABS: 50.0000 mg | ORAL_TABLET | Freq: Three times a day (TID) | ORAL | Status: DC

## 2024-03-02 MED ORDER — LACTATED RINGERS IV SOLN: INTRAVENOUS | Status: DC

## 2024-03-02 MED ORDER — BACLOFEN 5 MG HALF TABLET: 5.0000 mg | ORAL_TABLET | Freq: Three times a day (TID) | ORAL | Status: DC

## 2024-03-02 MED ORDER — LACTATED RINGERS IV SOLN: INTRAVENOUS | Status: AC

## 2024-03-02 MED ORDER — TAMSULOSIN HCL 0.4 MG PO CAPS
0.4000 mg | ORAL_CAPSULE | Freq: Every day | ORAL | Status: DC
  Administered 2024-03-02 – 2024-03-06 (×5): 0.4 mg via ORAL
  Filled 2024-03-02 (×5): qty 1

## 2024-03-02 NOTE — Progress Notes (Signed)
 EEG complete - results pending

## 2024-03-02 NOTE — Evaluation (Signed)
 Occupational Therapy Evaluation Patient Details Name: [name] MRN: [id number] DOB: [birth year] Today's Date: 03/02/2024   History of Present Illness   Patient is a 63 yo female presenting to the ED with AMS and hallucinations on 03/01/24. Patient had 3 teeth pulled on 10/30. Chest X-ray clear, CT head clear, MRI clear. Admitted with AMS. 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     Clinical Impressions Prior to this admission, patient residing at a SNF, lifted out of bed at baseline, and has assistance for all her ADLs. Patient can complete basic grooming and self-feeding independently. Currently, patient is presenting with confusion, as the patient believes a spirit is inside her, upward beating nystagmus when looking up, need for total A of 2 to transition to EOB, and decreased activity tolerance. Patient also with increased tightness in her neck, with near fixed positioning of her head leaning to the R. Patient also endorsing pain, and pins and needles all over. Patient noted to have 4 beat of V-tach in supine prior to movement with OT and PT student. OT will continue to follow acutely, with therapy recommended when medically stable to return to her facility.      If plan is discharge home, recommend the following:   Two people to help with walking and/or transfers;Two people to help with bathing/dressing/bathroom;Assistance with cooking/housework;Assistance with feeding;Direct supervision/assist for medications management;Direct supervision/assist for financial management;Assist for transportation;Help with stairs or ramp for entrance;Supervision due to cognitive status     Functional Status Assessment   Patient has had a recent decline in their functional status and demonstrates the ability to make significant improvements in function in a reasonable and predictable amount of time.      Equipment Recommendations   None recommended by OT     Recommendations for Other Services         Precautions/Restrictions   Precautions Precautions: Fall Recall of Precautions/Restrictions: Impaired Restrictions Weight Bearing Restrictions Per Provider Order: No     Mobility Bed Mobility Overal bed mobility: Needs Assistance Bed Mobility: Rolling, Sidelying to Sit, Sit to Sidelying Rolling: Total assist, +2 for physical assistance Sidelying to sit: Total assist, +2 for physical assistance     Sit to sidelying: Total assist, +2 for physical assistance General bed mobility comments: Pt unable to provide physical assist for any bed mobility today despite verbal and tactile cueing.    Transfers                   General transfer comment: deferred due to lift at baseline      Balance Overall balance assessment: Needs assistance Sitting-balance support: Feet supported, Bilateral upper extremity supported Sitting balance-Leahy Scale: Zero Sitting balance - Comments: Pt unable to maintain sitting balance without maxA-totalA behind pt. Postural control: Posterior lean                                 ADL either performed or assessed with clinical judgement   ADL Overall ADL's : Needs assistance/impaired Eating/Feeding: Minimal assistance;Sitting   Grooming: Minimal assistance;Sitting   Upper Body Bathing: Moderate assistance;Sitting   Lower Body Bathing: Total assistance;Bed level   Upper Body Dressing : Moderate assistance;Sitting   Lower Body Dressing: Total assistance;Sitting/lateral leans;Bed level   Toilet Transfer: Total assistance   Toileting- Clothing Manipulation and Hygiene: Total assistance;+2 for physical assistance;+2 for safety/equipment;Bed level  Functional mobility during ADLs: Total assistance;+2 for physical assistance;+2 for safety/equipment;Cueing for safety;Cueing for sequencing General ADL Comments: Prior  to this admission, patient residing at a SNF, lifted out of bed at baseline, and has assistance for all her ADLs. Patient can complete basic grooming and self-feeding independently. Currently, patient is presenting with confusion, as the patient believes a spirit is inside her, upward beating nystagmus when looking up, need for total A of 2 to transition to EOB, and decreased activity tolerance. Patient also with increased tightness in her neck, with near fixed positioning of her head leaning to the R. Patient also endorsing pain, and pins and needles all over. Patient noted to have 4 beat of V-tach in supine prior to movement with OT and PT student. OT will continue to follow acutely, with therapy recommended when medically stable to return to her facility.     Vision Baseline Vision/History: 0 No visual deficits Ability to See in Adequate Light: 0 Adequate Patient Visual Report: Central vision impairment Vision Assessment?: Vision impaired- to be further tested in functional context Additional Comments: Patient has an upward beating nystagmus when looking up     Perception Perception: Not tested       Praxis Praxis: Not tested       Pertinent Vitals/Pain Pain Assessment Pain Assessment: Faces Faces Pain Scale: Hurts even more Pain Location: L cervical and L upper trapezius Pain Descriptors / Indicators: Discomfort, Grimacing, Guarding Pain Intervention(s): Limited activity within patient's tolerance, Monitored during session, Repositioned     Extremity/Trunk Assessment Upper Extremity Assessment Upper Extremity Assessment: LUE deficits/detail;RUE deficits/detail RUE Deficits / Details: weakness and effortful movement, but able to complete basic ROM movements RUE Sensation: WNL RUE Coordination: decreased gross motor LUE Deficits / Details: previous L hemiparesis LUE Sensation: WNL LUE Coordination: decreased fine motor;decreased gross motor   Lower Extremity  Assessment Lower Extremity Assessment: Defer to PT evaluation RLE Deficits / Details: Noted 2/5 general R LE strength. RLE Sensation: decreased light touch LLE Deficits / Details: L LE and UE weakness and flexor tone noted likely d/t chronic CVA. LLE Sensation: WNL   Cervical / Trunk Assessment Cervical / Trunk Assessment: Kyphotic   Communication Communication Communication: Impaired Factors Affecting Communication: Reduced clarity of speech   Cognition Arousal: Alert Behavior During Therapy: Anxious, Flat affect Cognition: Cognition impaired     Awareness: Intellectual awareness intact, Online awareness impaired Memory impairment (select all impairments): Short-term memory Attention impairment (select first level of impairment): Focused attention Executive functioning impairment (select all impairments): Sequencing, Problem solving OT - Cognition Comments: Patient stating, a spirit is working within me or things such as he is causing this dont you believe me? patient is oriented, and is redirectable                 Following commands: Impaired Following commands impaired: Follows one step commands inconsistently, Follows one step commands with increased time     Cueing  General Comments   Cueing Techniques: Verbal cues;Gestural cues;Tactile cues  Pt generally confused throughout session today, but able to answer some questions about PLOF with inc time. Did note up beating nystagmus B while supine, and up and L beating nystagmus while sitting EOB. Test of skew positive bilaterally. Pt denied dizziness or lightheadedness. Also noted a run of 4 beats of ventricular tachycardia while supine and conversing with therapists; pt was asymptomatic. RN notified about this.   Exercises     Shoulder Instructions      Home Living Family/patient  expects to be discharged to:: Other (Comment)     Type of Home: Other(Comment) (SNF)                            Additional Comments: Long term care facility      Prior Functioning/Environment Prior Level of Function : Needs assist  Cognitive Assist : Mobility (cognitive);ADLs (cognitive) Mobility (Cognitive): Step by step cues ADLs (Cognitive): Step by step cues Physical Assist : Mobility (physical);ADLs (physical) Mobility (physical): Bed mobility ADLs (physical): Grooming;Bathing;Dressing;Toileting Mobility Comments: Mechanical lift and w/c typically used for mobility PTA. ADLs Comments: Pt states she requires assist with dressing, toileting, and bathing at her assisted living facility. Seems like she sponge bathes at baseline, but hard to tell d/t reduced clarity of speech. She is typically able to feed herself once food arrives.    OT Problem List: Decreased strength;Decreased activity tolerance;Impaired balance (sitting and/or standing);Decreased cognition;Decreased safety awareness;Impaired UE functional use;Pain;Impaired vision/perception   OT Treatment/Interventions: Self-care/ADL training;Therapeutic exercise;Energy conservation;DME and/or AE instruction;Manual therapy;Therapeutic activities;Patient/family education;Balance training;Visual/perceptual remediation/compensation;Cognitive remediation/compensation      OT Goals(Current goals can be found in the care plan section)   Acute Rehab OT Goals Patient Stated Goal: to get better OT Goal Formulation: With patient Time For Goal Achievement: 03/16/24 Potential to Achieve Goals: Fair   OT Frequency:  Min 2X/week    Co-evaluation   Reason for Co-Treatment: Necessary to address cognition/behavior during functional activity;For patient/therapist safety PT goals addressed during session: Mobility/safety with mobility;Balance OT goals addressed during session: ADL's and self-care      AM-PAC OT 6 Clicks Daily Activity     Outcome Measure Help from another person eating meals?: A Little Help from another person taking care of  personal grooming?: A Little Help from another person toileting, which includes using toliet, bedpan, or urinal?: Total Help from another person bathing (including washing, rinsing, drying)?: A Lot Help from another person to put on and taking off regular upper body clothing?: A Lot Help from another person to put on and taking off regular lower body clothing?: Total 6 Click Score: 12   End of Session Nurse Communication: Mobility status;Patient requests pain meds (V tach)  Activity Tolerance: Patient limited by fatigue;Patient limited by pain Patient left: in bed;with call bell/phone within reach;with bed alarm set  OT Visit Diagnosis: Unsteadiness on feet (R26.81);Other abnormalities of gait and mobility (R26.89);Muscle weakness (generalized) (M62.81);Other symptoms and signs involving cognitive function;Adult, failure to thrive (R62.7);Pain Pain - part of body:  (mouth)                Time: 9162-9092 OT Time Calculation (min): 30 min Charges:  OT General Charges $OT Visit: 1 Visit OT Evaluation $OT Eval Moderate Complexity: 1 Mod  Ronal Gift E. Zymere Patlan, OTR/L Acute Rehabilitation Services [phone]   Ronal Gift Salt 03/02/2024, 1:40 PM

## 2024-03-02 NOTE — Evaluation (Signed)
 Speech Language Pathology Evaluation Patient Details Name: [name] MRN: [id number] DOB: 23-Apr-1961 Today's Date: 03/02/2024 Time: 8958-8944 SLP Time Calculation (min) (ACUTE ONLY): 14 min  Problem List:  Patient Active Problem List   Diagnosis Date Noted   Disorientation 03/01/2024   Protein-calorie malnutrition, severe 10/08/2022   Severe sepsis (HCC) 10/05/2022   Dehydration 10/05/2022   Acute prerenal azotemia 10/05/2022   Proctitis 10/05/2022   Hypercalcemia 10/05/2022   Transaminitis 10/05/2022   Pyuria 10/05/2022   Depression 10/05/2022   CAP (community acquired pneumonia) 10/04/2022   Malnutrition of moderate degree 09/26/2022   SOB (shortness of breath) 09/25/2022   Nausea vomiting and diarrhea 09/24/2022   Shortness of breath 09/24/2022   Acute respiratory failure with hypoxia (HCC) 09/24/2022   LFT elevation 09/24/2022   Diabetes mellitus (HCC) 09/06/2022   Rectal ulcer 07/09/2022   Colon ulcer 07/08/2022   Rectal bleeding 07/07/2022   Anemia 07/07/2022   GI bleed 07/06/2022   Dysarthria 06/12/2022   Aspiration pneumonia (HCC) 06/12/2022   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   Anxiety disorder due to medical condition 06/10/2022   AKI (acute kidney injury) 06/01/2022   Dizziness 05/29/2022   Personal history of CLL (chronic lymphocytic leukemia) 05/29/2022   Rectal pain 05/29/2022   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 disease, with long-term current use of insulin  (HCC) 04/14/2022   Type 2 diabetes mellitus with diabetic polyneuropathy, with long-term current use of insulin  (HCC) 04/14/2022   Antiplatelet or antithrombotic long-term use 03/16/2022   HLD (hyperlipidemia) 10/01/2021   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HCC) 09/17/2021   Stage 3a chronic kidney disease (CKD) (HCC) 09/16/2021   Thyroid  nodule 09/16/2021   Cervical lymphadenopathy 09/16/2021   Acute CVA (cerebrovascular accident) (HCC) 09/15/2021   GERD (gastroesophageal  reflux disease) 04/30/2021   Peripheral arterial disease 04/13/2021   Atherosclerosis of aorta 02/14/2021   Cerebral thrombosis with cerebral infarction 02/08/2021   Abnormal MRI of head 02/07/2021   Vertigo 02/07/2021   Class 1 obesity due to excess calories with body mass index (BMI) of 31.0 to 31.9 in adult 02/07/2021   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   Trigger finger, left ring finger 10/04/2018   Alternating constipation and diarrhea 04/20/2018   Diabetic peripheral neuropathy associated with type 2 diabetes mellitus (HCC) 02/26/2018   DM2 (diabetes mellitus, type 2) (HCC) 07/29/2015   ABNORMALITY OF GAIT 05/10/2010   ECZEMA, ATOPIC 04/03/2009   Hyperlipidemia associated with type 2 diabetes mellitus (HCC) 03/06/2009   BACK PAIN 11/14/2008   Hemiparesis affecting left side as late effect of stroke (HCC) 08/05/2008   DIPLOPIA 07/15/2008   Essential hypertension 07/15/2008   Past Medical History:  Past Medical History:  Diagnosis Date   Abnormality of gait 05/10/2010   BACK PAIN 11/14/2008   Class 1 obesity due to excess calories with body mass index (BMI) of 31.0 to 31.9 in adult 02/07/2021   DIABETES MELLITUS, TYPE II 07/15/2008   Diplopia 07/15/2008   ECZEMA, ATOPIC 04/03/2009   GERD (gastroesophageal reflux disease)    Guillain-Barre 1988   HYPERLIPIDEMIA 03/06/2009   HYPERTENSION 07/15/2008   Stroke (HCC) 2010, 2011   x2    Vertebral artery stenosis    Past Surgical History:  Past Surgical History:  Procedure Laterality Date   ABDOMINAL HYSTERECTOMY     BIOPSY  07/08/2022   Procedure: BIOPSY;  Surgeon: Leigh Elspeth SQUIBB, MD;  Location: [facility] ENDOSCOPY;  Service: Gastroenterology;;   BIOPSY  10/12/2022   Procedure: BIOPSY;  Surgeon: San Sandor GAILS, DO;  Location: [facility] ENDOSCOPY;  Service: Gastroenterology;;   COLONOSCOPY WITH PROPOFOL  N/A 07/08/2022   Procedure: COLONOSCOPY WITH PROPOFOL ;  Surgeon: Leigh Elspeth SQUIBB, MD;  Location: [facility]OSCOPY;   Service: Gastroenterology;  Laterality: N/A;   DILATION AND CURETTAGE OF UTERUS     FLEXIBLE SIGMOIDOSCOPY N/A 10/12/2022   Procedure: FLEXIBLE SIGMOIDOSCOPY;  Surgeon: San Sandor GAILS, DO;  Location: [facility] ENDOSCOPY;  Service: Gastroenterology;  Laterality: N/A;   FOOT SURGERY     IR ANGIO INTRA EXTRACRAN SEL COM CAROTID INNOMINATE UNI L MOD SED  09/17/2021   IR ANGIO INTRA EXTRACRAN SEL INTERNAL CAROTID UNI R MOD SED  09/17/2021   IR ANGIO VERTEBRAL SEL VERTEBRAL UNI R MOD SED  09/17/2021   IR US  GUIDE VASC ACCESS RIGHT  09/17/2021   HPI:  In with AMS and hallucinations.  PMH:  T2DM, HTN, HLD, stroke with residual left sided weakness/hemiparesis and dysarthria, sepsis from UTI, depression, proctocolitis, aspiration pneumonia, non hodgkins lymphoma, Guillain-Barre syndrome recovered and neuropathy.  Patient known to ST service with MBS completed 09/25/2022 with findings of oropharyngeal dysphagia and diet recommendation of mechanical soft with mildly thick liquids.  MRI of the brain was showing no acute intracranial abnormality.   Chest xray was showing stable check with no acute process noted.   Assessment / Plan / Recommendation Clinical Impression  Cognitive/linguistic evaluation and motor speech screen were completed.  Cranial nerve exam was completed and unremarkable.  Patient with halting speech pattern that impacted intelligibility. Ongoing assessment needed to determine if related to any dysarthria or apraxia.  She completed portions of the Mini Mental State Exam achieving an overall score of 21/28.  Deficits were noted for orientation, delayed recall and attention.  She was fully oriented to person and place.  She was partially oriented to time stating the year, season, date and month correctly.  She stated the day of the week incorrectly.  She was not oriented to situation.  She had good immediate recall of 3 novel words.  Given a short delay she was unable to recall any of the words.  Use of  semantic cue did facilitate recall of all 3 words.  Mild deficits noted for the attention task (ie score 3/5).  For tasks assessed language skills were grossly intact.  She was able to name objects, repeat a short sentence, follow a 3 step command and read/comprehend a sentence.  Informally she was able to provide logical solutions to simple problems.  ST will follow to initiate cognitive therapy while information regarding baseline is hopefully learned as suspect some deficits to be baseline.    SLP Assessment  SLP Recommendation/Assessment: Patient needs continued Speech Language Pathology Services SLP Visit Diagnosis: Cognitive communication deficit (R41.841)     Assistance Recommended at Discharge  Frequent or constant Supervision/Assistance  Functional Status Assessment Patient has had a recent decline in their functional status and demonstrates the ability to make significant improvements in function in a reasonable and predictable amount of time.  Frequency and Duration min 1 x/week  2 weeks      SLP Evaluation Cognition  Overall Cognitive Status: No family/caregiver present to determine baseline cognitive functioning Arousal/Alertness: Awake/alert Orientation Level: Oriented to person;Oriented to place;Disoriented to time;Disoriented to situation Attention: Sustained Sustained Attention: Impaired Sustained Attention Impairment: Verbal basic Memory: Impaired Memory Impairment: Decreased recall of new information Awareness: Impaired Awareness Impairment: Intellectual impairment Problem Solving: Appears intact       Comprehension  Auditory Comprehension Overall Auditory  Comprehension: Appears within functional limits for tasks assessed Commands: Within Functional Limits Conversation: Simple Reading Comprehension Reading Status: Within funtional limits    Expression Expression Primary Mode of Expression: Nonverbal - gestures Verbal Expression Overall Verbal Expression:  Appears within functional limits for tasks assessed Initiation: No impairment Automatic Speech: Name;Social Response Level of Generative/Spontaneous Verbalization: Phrase Repetition: No impairment Naming: No impairment Pragmatics: No impairment Non-Verbal Means of Communication: Not applicable Written Expression Written Expression: Not tested   Oral / Motor  Oral Motor/Sensory Function Overall Oral Motor/Sensory Function: Within functional limits Motor Speech Overall Motor Speech: Impaired Respiration: Within functional limits Phonation: Normal Articulation: Impaired Level of Impairment: Phrase Intelligibility: Intelligibility reduced Motor Planning: Not tested           Eleanor Eagles, MA, CCC-SLP Acute Rehab SLP [phone]  Eleanor LOISE Eagles 03/02/2024, 12:22 PM

## 2024-03-02 NOTE — Procedures (Signed)
 Patient Name: [name]  MRN: [id number]  Epilepsy Attending: Arlin MALVA Krebs  Referring Physician/Provider: Dennise Lavada POUR, MD  Date:  03/02/2024 Duration: 22.23 mins  Patient history: 63yo f with ams. EEG to evaluate for seizure  Level of alertness: Awake  AEDs during EEG study: None  Technical aspects: This EEG study was done with scalp electrodes positioned according to the 10-20 International system of electrode placement. Electrical activity was reviewed with band pass filter of 1-70Hz , sensitivity of 7 uV/mm, display speed of 24mm/sec with a 60Hz  notched filter applied as appropriate. EEG data were recorded continuously and digitally stored.  Video monitoring was available and reviewed as appropriate.  Description: The posterior dominant rhythm consists of 8Hz  activity of moderate voltage (25-35 uV) seen predominantly in posterior head regions, symmetric and reactive to eye opening and eye closing. Hyperventilation and photic stimulation were not performed.     IMPRESSION: This study is within normal limits. No seizures or epileptiform discharges were seen throughout the recording.  A normal interictal EEG does not exclude the diagnosis of epilepsy.   Timmi Devora O Larra Crunkleton

## 2024-03-02 NOTE — Evaluation (Signed)
 Physical Therapy Evaluation Patient Details Name: [name] MRN: [id number] DOB: 1960/11/18 Today's Date: 03/02/2024  History of Present Illness  Patient is a 63 yo female presenting to the ED with AMS and hallucinations on 03/01/24. Patient had 3 teeth pulled on 10/30. Chest X-ray clear, CT head clear, MRI clear. Admitted with AMS. 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  Clinical Impression  Pt A&Ox4 and able to answer some questions regarding her PLOF, but would intermittently state nonsensical things throughout session including that someone was preventing her from moving and he closed the door. At baseline, pt states she is typically mechanically lifted to w/c. Pt required totalA for bed mobility today, including rolling and sidelying<>sit, despite hand over hand guidance of R hand to bed handrail with rolling. In sitting, pt continued to require totalA to maintain balance and participate in strength testing and further questioning. Throughout session, pt c/o neck and tooth pain. Attempted cervical ROM, with which noted grimacing and further c/o pain. Noted very tight bilateral upper trapezius, and at rest pt's neck is rotated R and slightly flexed. She is able to turn head to midline when verbally cued. Also noted L sided weakness, likely d/t chronic CVA. R LE MMT generally 2/5, with her being unable to move through full ROM. Finally, noted positive test of skew bilaterally. Recommending return to long term care facility without follow up PT upon d/c. Acute PT to follow for ROM, bed mobility, and to improve sitting balance for purposes of increased function and pressure relief.  Vitals: BP: 153/94 sitting EOB      If plan is discharge home, recommend the following: Two people to help with walking and/or transfers;Two people to help with bathing/dressing/bathroom;Assistance with  cooking/housework;Assistance with feeding;Assist for transportation;Direct supervision/assist for medications management;Direct supervision/assist for financial management;Supervision due to cognitive status   Can travel by private vehicle   No    Equipment Recommendations None recommended by PT  Recommendations for Other Services       Functional Status Assessment Patient has not had a recent decline in their functional status     Precautions / Restrictions Precautions Precautions: Fall Recall of Precautions/Restrictions: Impaired Restrictions Weight Bearing Restrictions Per Provider Order: No      Mobility  Bed Mobility Overal bed mobility: Needs Assistance Bed Mobility: Rolling, Sidelying to Sit, Sit to Sidelying Rolling: Total assist, +2 for physical assistance Sidelying to sit: Total assist, +2 for physical assistance     Sit to sidelying: Total assist, +2 for physical assistance General bed mobility comments: Pt unable to provide physical assist for any bed mobility today despite verbal and tactile cueing.    Transfers                        Ambulation/Gait                  Stairs            Wheelchair Mobility     Tilt Bed    Modified Rankin (Stroke Patients Only)       Balance Overall balance assessment: Needs assistance Sitting-balance support: Feet supported, Bilateral upper extremity supported Sitting balance-Leahy Scale: Zero Sitting balance - Comments: Pt unable to maintain sitting balance without maxA-totalA behind pt. Postural control: Posterior lean  Pertinent Vitals/Pain Pain Assessment Pain Assessment: Faces Faces Pain Scale: Hurts even more Pain Location: L cervical and L upper trapezius Pain Descriptors / Indicators: Discomfort, Grimacing, Guarding Pain Intervention(s): Limited activity within patient's tolerance, Monitored during session    Home Living  Family/patient expects to be discharged to:: Other (Comment)                   Additional Comments: Long term care facility    Prior Function Prior Level of Function : Needs assist  Cognitive Assist : Mobility (cognitive);ADLs (cognitive) Mobility (Cognitive): Step by step cues ADLs (Cognitive): Step by step cues Physical Assist : Mobility (physical);ADLs (physical) Mobility (physical): Bed mobility ADLs (physical): Grooming;Bathing;Dressing;Toileting Mobility Comments: Mechanical lift and w/c typically used for mobility PTA. ADLs Comments: Pt states she requires assist with dressing, toileting, and bathing at her assisted living facility. Seems like she sponge bathes at baseline, but hard to tell d/t reduced clarity of speech. She is typically able to feed herself once food arrives.     Extremity/Trunk Assessment   Upper Extremity Assessment Upper Extremity Assessment: Defer to OT evaluation    Lower Extremity Assessment Lower Extremity Assessment: LLE deficits/detail;RLE deficits/detail RLE Deficits / Details: Noted 2/5 general R LE strength. RLE Sensation: decreased light touch LLE Deficits / Details: L LE and UE weakness and flexor tone noted likely d/t chronic CVA. LLE Sensation: WNL    Cervical / Trunk Assessment Cervical / Trunk Assessment: Kyphotic  Communication   Communication Communication: Impaired Factors Affecting Communication: Reduced clarity of speech    Cognition Arousal: Alert Behavior During Therapy: Anxious, Flat affect   PT - Cognitive impairments: Awareness, Attention, Initiation, Sequencing, Problem solving, Safety/Judgement                       PT - Cognition Comments: Pt A&Ox4. Pt required inc time to answer questions, and at times would refer to he or she closing the door or not allowing her to move with us . Pt repetitively c/o neck and tooth pain throughout. Often required repetitive verbal cueing to carry out mobility  tasks and special tests. Following commands: Impaired Following commands impaired: Follows one step commands inconsistently, Follows one step commands with increased time     Cueing Cueing Techniques: Verbal cues, Gestural cues, Tactile cues     General Comments General comments (skin integrity, edema, etc.): Pt generally confused throughout session today, but able to answer some questions about PLOF with inc time. Did note up beating nystagmus B while supine, and up and L beating nystagmus while sitting EOB. Test of skew positive bilaterally. Pt denied dizziness or lightheadedness. Also noted a run of 4 beats of ventricular tachycardia while supine and conversing with therapists; pt was asymptomatic. RN notified about this.    Exercises     Assessment/Plan    PT Assessment Patient needs continued PT services  PT Problem List Decreased strength;Decreased range of motion;Decreased activity tolerance;Decreased balance;Decreased mobility;Decreased coordination;Decreased cognition;Decreased knowledge of use of DME;Decreased safety awareness;Decreased knowledge of precautions       PT Treatment Interventions Functional mobility training;Therapeutic activities;Balance training;Therapeutic exercise;Neuromuscular re-education;Patient/family education;Wheelchair mobility training;Manual techniques;DME instruction    PT Goals (Current goals can be found in the Care Plan section)  Acute Rehab PT Goals Patient Stated Goal: Unable PT Goal Formulation: Patient unable to participate in goal setting Time For Goal Achievement: 03/16/24 Potential to Achieve Goals: Fair    Frequency Min 1X/week     Co-evaluation PT/OT/SLP Co-Evaluation/Treatment: Yes  Reason for Co-Treatment: Necessary to address cognition/behavior during functional activity;For patient/therapist safety PT goals addressed during session: Mobility/safety with mobility;Balance OT goals addressed during session: ADL's and self-care        AM-PAC PT 6 Clicks Mobility  Outcome Measure Help needed turning from your back to your side while in a flat bed without using bedrails?: Total Help needed moving from lying on your back to sitting on the side of a flat bed without using bedrails?: Total Help needed moving to and from a bed to a chair (including a wheelchair)?: Total Help needed standing up from a chair using your arms (e.g., wheelchair or bedside chair)?: Total Help needed to walk in hospital room?: Total Help needed climbing 3-5 steps with a railing? : Total 6 Click Score: 6    End of Session   Activity Tolerance: Patient tolerated treatment well Patient left: in bed;with bed alarm set Nurse Communication: Mobility status PT Visit Diagnosis: Muscle weakness (generalized) (M62.81);Other abnormalities of gait and mobility (R26.89)    Time: 9162-9092 PT Time Calculation (min) (ACUTE ONLY): 30 min   Charges:   PT Evaluation $PT Eval Moderate Complexity: 1 Mod   PT General Charges $$ ACUTE PT VISIT: 1 Visit         Hara Milholland, SPT   Keion Neels 03/02/2024, 11:02 AM

## 2024-03-02 NOTE — Progress Notes (Addendum)
 PROGRESS NOTE                                                                                                                                                                                                             Patient Demographics:    [name], is a 63 y.o. 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  Per report patient had 3 teeth extracted last week on Thursday and since then has been noted to have disorientation confusion.  Also reports cough.  And per report patient has been behaving not her usual baseline.  Patient also reported to have visual hallucinations.  Spoke to ms brenda and states that she was hollering and h/o strokes and has not been eating or drinking and was refusing her meds.   Subjective:    [name] today has, No headache, No chest pain, No abdominal pain - No Nausea, No new weakness tingling or numbness, no SOB   Assessment  & Plan :   Acute metabolic encephalopathy with some hallucinations and disorientation which happened after she had 3 teeth pulled about 3 days ago. So far no clear source except delirium from anesthesia that she received, MRI brain nonacute, no headache or focal deficits, will get EEG, will also check CT maxillofacial and neck to rule out any infectious source,  minimize narcotics and benzodiazepines, continue supportive care.  PT, exposed to sunlight and monitor.  She does have urinary retention on exam could be causing her encephalopathy.    Urinary retention.  Recurrent.  Flomax and Foley.  UA pending.  History of lymphoma and chronic anemia.  Continue on home medications venetoclax   Dehydration and AKI.  Improving with IV fluids continue to monitor.  Renal function likely close to baseline.  Hypertension.  On  Norvasc  and hydralazine .  Hold ARB due to AKI.  HX of stroke.  No acute issues brain MRI negative, continue aspirin  and statin for secondary prevention.  History of hyperthyroidism.  Stable TSH, continue methimazole.  History of GERD.  PPI.  DM type II.  Lantus  and sliding scale.  Lab Results  Component Value Date   HGBA1C 6.7 (A) 09/06/2022   CBG (last 3)  Recent Labs    03/01/24 1509 03/01/24 2235 03/02/24 0814  GLUCAP 163* 131* 105*   Lab Results  Component Value Date   TSH 3.338 03/01/2024         Condition -  Guarded  Family Communication  :  sister [name] [phone]  on 03/02/24  Code Status : Full code  Consults  : None  PUD Prophylaxis : PPI   Procedures  :     MRI brain nonacute.  CT soft tissue neck and maxillofacial.      Disposition Plan  :    Status is: Observation   DVT Prophylaxis  :    heparin  injection 5,000 Units Start: 03/01/24 2200   Lab Results  Component Value Date   PLT 254 03/02/2024    Diet :  Diet Order             Diet heart healthy/carb modified Room service appropriate? Yes; Fluid consistency: Thin  Diet effective now                    Inpatient Medications  Scheduled Meds:  amLODipine   10 mg Oral Daily   aspirin  EC  81 mg Oral Daily   atorvastatin   80 mg Oral Daily   baclofen   20 mg Oral TID   ezetimibe   10 mg Oral Daily   feeding supplement  237 mL Oral BID BM   FLUoxetine   40 mg Oral Daily   heparin   5,000 Units Subcutaneous Q12H    pantoprazole   40 mg Oral Daily   venetoclax   200 mg Oral Daily   Continuous Infusions:  lactated ringers  100 mL/hr at 03/02/24 0926   PRN Meds:.acetaminophen  **OR** acetaminophen  (TYLENOL ) oral liquid 160 mg/5 mL **OR** acetaminophen , insulin  aspart  Antibiotics  :    Anti-infectives (From admission, onward)    None         Objective:   Vitals:   03/01/24 2330 03/02/24 0330 03/02/24 0800 03/02/24 0912  BP: (!) 156/102 (!) 137/92  (!) 153/94  Pulse: 100 91    Resp: (!) 21 19    Temp: 97.6 F (36.4 C) 97.7 F (36.5 C) (!) 97.3 F (36.3 C)   TempSrc: Oral Oral Oral   SpO2: 99% 100%    Weight:        Wt Readings from Last 3 Encounters:  03/01/24 92 kg  10/10/23 72.6 kg  07/26/23 71.7 kg     Intake/Output Summary (Last 24 hours) at 03/02/2024 0948 Last data filed at 03/02/2024 0710 Gross per 24 hour  Intake --  Output 600 ml  Net -600 ml     Physical Exam  Awake, minimally confused, oriented x 2 no new F.N deficits,  [place].AT,PERRAL Supple Neck, No JVD,   Symmetrical Chest wall movement, Good air movement bilaterally, CTAB RRR,No Gallops,Rubs or new Murmurs,  +ve B.Sounds, Abd Soft, No tenderness,   No Cyanosis, Clubbing or edema     RN pressure injury documentation: Wound 03/01/24 2030 Pressure Injury Coccyx Medial Stage 2 -  Partial thickness loss of dermis presenting as a shallow  open injury with a red, pink wound bed without slough. (Active)      Data Review:    Recent Labs  Lab 03/01/24 1505 03/01/24 1519 03/02/24 0422 03/02/24 0633  WBC 5.3  --  5.5 5.8  HGB 12.3 11.9* 12.4 12.8  HCT 37.7 35.0* 38.3 37.7  PLT 255  --  238 254  MCV 83.2  --  83.4 81.8  MCH 27.2  --  27.0 27.8  MCHC 32.6  --  32.4 34.0  RDW 14.6  --  14.7 14.5  LYMPHSABS 1.0  --  1.4 1.6  MONOABS 0.5  --  0.8 0.7  EOSABS 0.0  --  0.1 0.1  BASOSABS 0.0  --  0.0 0.0    Recent Labs  Lab 03/01/24 1505 03/01/24 1519 03/01/24 2345 03/02/24 0422 03/02/24 0633  NA 140  142  --  141 140  K 4.4 4.4  --  3.7 3.7  CL 104  --   --  104 101  CO2 20*  --   --  22 24  ANIONGAP 16*  --   --  15 15  GLUCOSE 157*  --   --  138* 137*  BUN 25*  --   --  26* 26*  CREATININE 1.18*  --   --  1.06* 1.13*  AST 24  --   --  24  --   ALT 22  --   --  25  --   ALKPHOS 69  --   --  68  --   BILITOT 1.5*  --   --  1.1  --   ALBUMIN 3.2*  --   --  3.2*  --   TSH  --   --  3.338  --   --   AMMONIA 23  --   --   --  14  MG  --   --  1.8 1.8 1.9  PHOS  --   --   --  2.8  --   CALCIUM  10.0  --   --  10.3 10.4*      Recent Labs  Lab 03/01/24 1505 03/01/24 2345 03/02/24 0422 03/02/24 0633  TSH  --  3.338  --   --   AMMONIA 23  --   --  14  MG  --  1.8 1.8 1.9  CALCIUM  10.0  --  10.3 10.4*    --------------------------------------------------------------------------------------------------------------- Lab Results  Component Value Date   CHOL 132 03/02/2024   HDL 44 03/02/2024   LDLCALC 69 03/02/2024   LDLDIRECT 175.9 03/02/2012   TRIG 95 03/02/2024   CHOLHDL 3.0 03/02/2024    Lab Results  Component Value Date   HGBA1C 6.7 (A) 09/06/2022   Recent Labs    03/01/24 2345  TSH 3.338  FREET4 0.89   Recent Labs    03/02/24 0633  VITAMINB12 2,220*   ------------------------------------------------------------------------------------------------------------------ Cardiac Enzymes No results for input(s): CKMB, TROPONINI, MYOGLOBIN in the last 168 hours.  Invalid input(s): CK  Micro Results Recent Results (from the past 240 hours)  Resp panel by RT-PCR (RSV, Flu A&B, Covid) Anterior Nasal Swab     Status: None   Collection Time: 03/01/24  4:44 PM   Specimen: Anterior Nasal Swab  Result Value Ref Range Status   SARS Coronavirus 2 by RT PCR NEGATIVE NEGATIVE Final   Influenza A by PCR NEGATIVE NEGATIVE Final   Influenza B by PCR NEGATIVE NEGATIVE Final    Comment: (NOTE) The Xpert Xpress  SARS-CoV-2/FLU/RSV plus assay is intended as an  aid in the diagnosis of influenza from Nasopharyngeal swab specimens and should not be used as a sole basis for treatment. Nasal washings and aspirates are unacceptable for Xpert Xpress SARS-CoV-2/FLU/RSV testing.  Fact Sheet for Patients: bloggercourse.com  Fact Sheet for Healthcare Providers: seriousbroker.it  This test is not yet approved or cleared by the [place]  FDA and has been authorized for detection and/or diagnosis of SARS-CoV-2 by FDA under an Emergency Use Authorization (EUA). This EUA will remain in effect (meaning this test can be used) for the duration of the COVID-19 declaration under Section 564(b)(1) of the Act, 21 U.S.C. section 360bbb-3(b)(1), unless the authorization is terminated or revoked.     Resp Syncytial Virus by PCR NEGATIVE NEGATIVE Final    Comment: (NOTE) Fact Sheet for Patients: bloggercourse.com  Fact Sheet for Healthcare Providers: seriousbroker.it  This test is not yet approved or cleared by the [place]  FDA and has been authorized for detection and/or diagnosis of SARS-CoV-2 by FDA under an Emergency Use Authorization (EUA). This EUA will remain in effect (meaning this test can be used) for the duration of the COVID-19 declaration under Section 564(b)(1) of the Act, 21 U.S.C. section 360bbb-3(b)(1), unless the authorization is terminated or revoked.  Performed at [facility], [address], [place], [place] [address]     Radiology Report MR BRAIN WO CONTRAST Result Date: 03/01/2024 CLINICAL DATA:  Initial evaluation for acute mental status change, unknown cause. EXAM: MRI HEAD WITHOUT CONTRAST TECHNIQUE: Multiplanar, multiecho pulse sequences of the brain and surrounding structures were obtained without intravenous contrast. COMPARISON:  Comparison made with CT performed earlier the same day as well as previous MRI from  01/05/2023. FINDINGS: Brain: Cerebral volume within normal limits. Patchy T2/FLAIR hyperintensity involving the periventricular and deep white matter both cerebral hemispheres, consistent with chronic small vessel ischemic disease, similar prior. Chronic encephalomalacia involving the ventral medulla again noted, stable. Few scattered small remote bilateral cerebellar infarcts, unchanged. No abnormal foci of restricted diffusion to suggest acute or subacute ischemia. No areas of interval cortical infarction. No acute intracranial hemorrhage. Few small chronic micro hemorrhages noted about the right cerebellum, right thalamus, and right occipital lobe, likely small vessel related. No mass lesion, midline shift or mass effect. No hydrocephalus or extra-axial fluid collection. Pituitary gland within normal limits. Vascular: Loss of normal flow voids within the right greater than left intradural V4 segments to the vertebrobasilar junction. Preserved flow void distally within the basilar artery. Overall, appearance is relatively stable as compared to previous exam. Preserved normal flow voids within the anterior circulation. Skull and upper cervical spine: Craniocervical junction within normal limits. Bone marrow signal intensity overall within normal limits. No scalp soft tissue abnormality. Sinuses/Orbits: Globes orbital soft tissues within normal limits. Paranasal sinuses are largely clear. Trace left mastoid effusion noted, of doubtful significance. Other: None. IMPRESSION: 1. No acute intracranial abnormality. 2. Chronic encephalomalacia involving the ventral medulla, with a few scattered small remote bilateral cerebellar infarcts, stable. 3. Loss of normal flow voids within the right greater than left intradural V4 segments to the vertebrobasilar junction, stable from previous. 4. Underlying chronic microvascular ischemic disease, stable. Electronically Signed   By: Morene Hoard M.D.   On: 03/01/2024 20:27    DG Chest Portable 1 View Result Date: 03/01/2024 CLINICAL DATA:  Cough, recent dental procedure, hallucinations EXAM: PORTABLE CHEST 1 VIEW COMPARISON:  03/28/2023 FINDINGS: Single frontal view of the chest demonstrates a stable cardiac silhouette.  Linear scarring at the left lung base unchanged. No airspace disease, effusion, or pneumothorax. No acute bony abnormalities. IMPRESSION: 1. Stable chest, no acute process. Electronically Signed   By: Ozell Daring M.D.   On: 03/01/2024 15:28   CT HEAD WO CONTRAST Result Date: 03/01/2024 EXAM: CT HEAD WITHOUT CONTRAST 03/01/2024 02:15:00 PM TECHNIQUE: CT of the head was performed without the administration of intravenous contrast. Automated exposure control, iterative reconstruction, and/or weight based adjustment of the mA/kV was utilized to reduce the radiation dose to as low as reasonably achievable. COMPARISON: 01/05/2023 CLINICAL HISTORY: Mental status change, unknown cause. FINDINGS: BRAIN AND VENTRICLES: No acute hemorrhage. No evidence of acute infarct. No hydrocephalus. No extra-axial collection. No mass effect or midline shift. Calcified atherosclerotic plaque within cavernous/supraclinoid internal carotid arteries. ORBITS: No acute abnormality. SINUSES: No acute abnormality. SOFT TISSUES AND SKULL: No acute soft tissue abnormality. No skull fracture. IMPRESSION: 1. No acute intracranial abnormality. 2. Intracranial internal carotid artery atherosclerosis with calcified plaque within the cavernous and supraclinoid segments. Electronically signed by: Franky Stanford MD 03/01/2024 03:02 PM EDT RP Workstation: [id number]     Signature  -   Lavada Stank M.D on 03/02/2024 at 9:48 AM   -  To page go to [email or web address]

## 2024-03-02 NOTE — Care Management Obs Status (Signed)
 MEDICARE OBSERVATION STATUS NOTIFICATION   Patient Details  Name: [name] MRN: [id number] Date of Birth: 13-Nov-1960   Medicare Observation Status Notification Given:  Yes    Marval Gell, RN 03/02/2024, 2:29 PM

## 2024-03-02 NOTE — TOC Initial Note (Signed)
 Transition of Care [facility]) - Initial/Assessment Note    Patient Details  Name: [name] MRN: [id number] Date of Birth: 06/07/1960  [facility]) CM/SW Contact:    Marval Gell, RN Phone Number: 03/02/2024, 2:30 PM  Clinical Narrative:                  Beatris w patient sister [name] Glatter.  She states that patient is from [facility] and plans will be for her return when she is medically stable.   [name] Glatter [phone]    Expected Discharge Plan: Skilled Nursing Facility Barriers to Discharge: Continued Medical Work up   Patient Goals and CMS Choice Patient states their goals for this hospitalization and ongoing recovery are:: return to SNF CMS Medicare.gov Compare Post Acute Care list provided to:: Other (Comment Required) Choice offered to / list presented to : Sibling      Expected Discharge Plan and Services In-house Referral: Clinical Social Work Discharge Planning Services: CM Consult Post Acute Care Choice: Nursing Home Living arrangements for the past 2 months: Skilled Nursing Facility                                      Prior Living Arrangements/Services Living arrangements for the past 2 months: Skilled Nursing Facility                     Activities of Daily Living   ADL Screening (condition at time of admission) Independently performs ADLs?: No Does the patient have a NEW difficulty with bathing/dressing/toileting/self-feeding that is expected to last >3 days?: No Does the patient have a NEW difficulty with getting in/out of bed, walking, or climbing stairs that is expected to last >3 days?: No Does the patient have a NEW difficulty with communication that is expected to last >3 days?: No Is the patient deaf or have difficulty hearing?: No Does the patient have difficulty seeing, even when wearing glasses/contacts?: No Does the patient have difficulty concentrating, remembering, or making decisions?:  Yes  Permission Sought/Granted                  Emotional Assessment              Admission diagnosis:  Hallucinations [R44.3] Delirium [R41.0] Disorientation [R41.0] Altered mental status, unspecified altered mental status type [R41.82] Patient Active Problem List   Diagnosis Date Noted   Disorientation 03/01/2024   Protein-calorie malnutrition, severe 10/08/2022   Severe sepsis (HCC) 10/05/2022   Dehydration 10/05/2022   Acute prerenal azotemia 10/05/2022   Proctitis 10/05/2022   Hypercalcemia 10/05/2022   Transaminitis 10/05/2022   Pyuria 10/05/2022   Depression 10/05/2022   CAP (community acquired pneumonia) 10/04/2022   Malnutrition of moderate degree 09/26/2022   SOB (shortness of breath) 09/25/2022   Nausea vomiting and diarrhea 09/24/2022   Shortness of breath 09/24/2022   Acute respiratory failure with hypoxia (HCC) 09/24/2022   LFT elevation 09/24/2022   Diabetes mellitus (HCC) 09/06/2022   Rectal ulcer 07/09/2022   Colon ulcer 07/08/2022   Rectal bleeding 07/07/2022   Anemia 07/07/2022   GI bleed 07/06/2022   Dysarthria 06/12/2022   Aspiration pneumonia (HCC) 06/12/2022   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   Anxiety disorder due to medical condition 06/10/2022   AKI (acute kidney injury) 06/01/2022   Dizziness 05/29/2022   Personal history of CLL (chronic lymphocytic leukemia) 05/29/2022   Rectal  pain 05/29/2022   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 disease, with long-term current use of insulin  (HCC) 04/14/2022   Type 2 diabetes mellitus with diabetic polyneuropathy, with long-term current use of insulin  (HCC) 04/14/2022   Antiplatelet or antithrombotic long-term use 03/16/2022   HLD (hyperlipidemia) 10/01/2021   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HCC) 09/17/2021   Stage 3a chronic kidney disease (CKD) (HCC) 09/16/2021   Thyroid  nodule 09/16/2021   Cervical lymphadenopathy 09/16/2021   Acute CVA (cerebrovascular accident)  (HCC) 09/15/2021   GERD (gastroesophageal reflux disease) 04/30/2021   Peripheral arterial disease 04/13/2021   Atherosclerosis of aorta 02/14/2021   Cerebral thrombosis with cerebral infarction 02/08/2021   Abnormal MRI of head 02/07/2021   Vertigo 02/07/2021   Class 1 obesity due to excess calories with body mass index (BMI) of 31.0 to 31.9 in adult 02/07/2021   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   Trigger finger, left ring finger 10/04/2018   Alternating constipation and diarrhea 04/20/2018   Diabetic peripheral neuropathy associated with type 2 diabetes mellitus (HCC) 02/26/2018   DM2 (diabetes mellitus, type 2) (HCC) 07/29/2015   ABNORMALITY OF GAIT 05/10/2010   ECZEMA, ATOPIC 04/03/2009   Hyperlipidemia associated with type 2 diabetes mellitus (HCC) 03/06/2009   BACK PAIN 11/14/2008   Hemiparesis affecting left side as late effect of stroke (HCC) 08/05/2008   DIPLOPIA 07/15/2008   Essential hypertension 07/15/2008   PCP:  [name], DO Pharmacy:   [facility] [place], [place] - [address] [place] [address] Phone: [phone] Fax: [phone]  D[facility] LONG - [facility] [address] [place] [address] Phone: [phone] Fax: [phone]     Social Drivers of Health (SDOH) Social History: SDOH Screenings   Food Insecurity: No Food Insecurity (03/01/2024)  Housing: Low Risk  (03/01/2024)  Transportation Needs: No Transportation Needs (03/01/2024)  Utilities: Not At Risk (03/01/2024)  Depression (PHQ2-9): Low Risk  (03/16/2022)  Tobacco Use: Low Risk  (03/01/2024)   SDOH Interventions:     Readmission Risk Interventions     No data to display

## 2024-03-02 NOTE — Progress Notes (Signed)
 TRH night cross cover note:   I was notified by RN of the patient's request for a sleep aid. I subsequently placed order for prn melatonin for insomnia.     Lisa Pigg, DO Hospitalist

## 2024-03-02 NOTE — Plan of Care (Signed)
  Problem: Coping: Goal: Ability to adjust to condition or change in health will improve Outcome: Progressing   Problem: Skin Integrity: Goal: Risk for impaired skin integrity will decrease Outcome: Progressing   Problem: Education: Goal: Knowledge of secondary prevention will improve (MUST DOCUMENT ALL) Outcome: Progressing   Problem: Ischemic Stroke/TIA Tissue Perfusion: Goal: Complications of ischemic stroke/TIA will be minimized Outcome: Progressing   Problem: Nutrition: Goal: Risk of aspiration will decrease Outcome: Progressing   Problem: Clinical Measurements: Goal: Diagnostic test results will improve Outcome: Progressing   Problem: Safety: Goal: Ability to remain free from injury will improve Outcome: Progressing   Problem: Skin Integrity: Goal: Risk for impaired skin integrity will decrease Outcome: Progressing

## 2024-03-03 DIAGNOSIS — Z1152 Encounter for screening for COVID-19: Secondary | ICD-10-CM | POA: Diagnosis not present

## 2024-03-03 DIAGNOSIS — Z8249 Family history of ischemic heart disease and other diseases of the circulatory system: Secondary | ICD-10-CM | POA: Diagnosis not present

## 2024-03-03 DIAGNOSIS — E785 Hyperlipidemia, unspecified: Secondary | ICD-10-CM | POA: Diagnosis present

## 2024-03-03 DIAGNOSIS — N320 Bladder-neck obstruction: Secondary | ICD-10-CM | POA: Diagnosis present

## 2024-03-03 DIAGNOSIS — T4145XA Adverse effect of unspecified anesthetic, initial encounter: Secondary | ICD-10-CM | POA: Diagnosis present

## 2024-03-03 DIAGNOSIS — E860 Dehydration: Secondary | ICD-10-CM | POA: Diagnosis present

## 2024-03-03 DIAGNOSIS — I69354 Hemiplegia and hemiparesis following cerebral infarction affecting left non-dominant side: Secondary | ICD-10-CM | POA: Diagnosis not present

## 2024-03-03 DIAGNOSIS — G9341 Metabolic encephalopathy: Secondary | ICD-10-CM | POA: Diagnosis present

## 2024-03-03 DIAGNOSIS — E876 Hypokalemia: Secondary | ICD-10-CM | POA: Diagnosis not present

## 2024-03-03 DIAGNOSIS — I119 Hypertensive heart disease without heart failure: Secondary | ICD-10-CM | POA: Diagnosis present

## 2024-03-03 DIAGNOSIS — E669 Obesity, unspecified: Secondary | ICD-10-CM | POA: Diagnosis present

## 2024-03-03 DIAGNOSIS — E1140 Type 2 diabetes mellitus with diabetic neuropathy, unspecified: Secondary | ICD-10-CM | POA: Diagnosis present

## 2024-03-03 DIAGNOSIS — I69322 Dysarthria following cerebral infarction: Secondary | ICD-10-CM | POA: Diagnosis not present

## 2024-03-03 DIAGNOSIS — Z6839 Body mass index (BMI) 39.0-39.9, adult: Secondary | ICD-10-CM | POA: Diagnosis not present

## 2024-03-03 DIAGNOSIS — D630 Anemia in neoplastic disease: Secondary | ICD-10-CM | POA: Diagnosis present

## 2024-03-03 DIAGNOSIS — E0590 Thyrotoxicosis, unspecified without thyrotoxic crisis or storm: Secondary | ICD-10-CM | POA: Diagnosis present

## 2024-03-03 DIAGNOSIS — L89152 Pressure ulcer of sacral region, stage 2: Secondary | ICD-10-CM | POA: Diagnosis present

## 2024-03-03 DIAGNOSIS — Z833 Family history of diabetes mellitus: Secondary | ICD-10-CM | POA: Diagnosis not present

## 2024-03-03 DIAGNOSIS — Z794 Long term (current) use of insulin: Secondary | ICD-10-CM | POA: Diagnosis not present

## 2024-03-03 DIAGNOSIS — G4700 Insomnia, unspecified: Secondary | ICD-10-CM | POA: Diagnosis present

## 2024-03-03 DIAGNOSIS — F05 Delirium due to known physiological condition: Secondary | ICD-10-CM | POA: Diagnosis present

## 2024-03-03 DIAGNOSIS — Z7984 Long term (current) use of oral hypoglycemic drugs: Secondary | ICD-10-CM | POA: Diagnosis not present

## 2024-03-03 DIAGNOSIS — C8590 Non-Hodgkin lymphoma, unspecified, unspecified site: Secondary | ICD-10-CM | POA: Diagnosis present

## 2024-03-03 DIAGNOSIS — N179 Acute kidney failure, unspecified: Secondary | ICD-10-CM | POA: Diagnosis present

## 2024-03-03 DIAGNOSIS — R410 Disorientation, unspecified: Secondary | ICD-10-CM | POA: Diagnosis present

## 2024-03-03 LAB — PROCALCITONIN: Procalcitonin: 2.49 ng/mL

## 2024-03-03 LAB — CBC WITH DIFFERENTIAL/PLATELET
Abs Immature Granulocytes: 0.04 K/uL (ref 0.00–0.07)
Basophils Absolute: 0 K/uL (ref 0.0–0.1)
Basophils Relative: 0 %
Eosinophils Absolute: 0.2 K/uL (ref 0.0–0.5)
Eosinophils Relative: 3 %
HCT: 31.5 % — ABNORMAL LOW (ref 36.0–46.0)
Hemoglobin: 10.6 g/dL — ABNORMAL LOW (ref 12.0–15.0)
Immature Granulocytes: 1 %
Lymphocytes Relative: 30 %
Lymphs Abs: 1.8 K/uL (ref 0.7–4.0)
MCH: 27.3 pg (ref 26.0–34.0)
MCHC: 33.7 g/dL (ref 30.0–36.0)
MCV: 81.2 fL (ref 80.0–100.0)
Monocytes Absolute: 0.8 K/uL (ref 0.1–1.0)
Monocytes Relative: 14 %
Neutro Abs: 3.2 K/uL (ref 1.7–7.7)
Neutrophils Relative %: 52 %
Platelets: 201 K/uL (ref 150–400)
RBC: 3.88 MIL/uL (ref 3.87–5.11)
RDW: 14.5 % (ref 11.5–15.5)
WBC: 5.9 K/uL (ref 4.0–10.5)
nRBC: 0 % (ref 0.0–0.2)

## 2024-03-03 LAB — BASIC METABOLIC PANEL WITH GFR
Anion gap: 10 (ref 5–15)
BUN: 35 mg/dL — ABNORMAL HIGH (ref 8–23)
CO2: 25 mmol/L (ref 22–32)
Calcium: 9.3 mg/dL (ref 8.9–10.3)
Chloride: 101 mmol/L (ref 98–111)
Creatinine, Ser: 1.29 mg/dL — ABNORMAL HIGH (ref 0.44–1.00)
GFR, Estimated: 46 mL/min — ABNORMAL LOW (ref >60)
Glucose, Bld: 221 mg/dL — ABNORMAL HIGH (ref 70–99)
Potassium: 3.4 mmol/L — ABNORMAL LOW (ref 3.5–5.1)
Sodium: 136 mmol/L (ref 135–145)

## 2024-03-03 LAB — GLUCOSE, CAPILLARY
Glucose-Capillary: 196 mg/dL — ABNORMAL HIGH (ref 70–99)
Glucose-Capillary: 318 mg/dL — ABNORMAL HIGH (ref 70–99)
Glucose-Capillary: 384 mg/dL — ABNORMAL HIGH (ref 70–99)
Glucose-Capillary: 296 mg/dL — ABNORMAL HIGH (ref 70–99)
Glucose-Capillary: 261 mg/dL — ABNORMAL HIGH (ref 70–99)

## 2024-03-03 LAB — MAGNESIUM: Magnesium: 1.8 mg/dL (ref 1.7–2.4)

## 2024-03-03 LAB — C-REACTIVE PROTEIN: CRP: <0.5 mg/dL (ref <1.0)

## 2024-03-03 MED ORDER — INSULIN ASPART 100 UNIT/ML IJ SOLN: 0.0000 [IU] | Freq: Three times a day (TID) | INTRAMUSCULAR | Status: DC

## 2024-03-03 MED ORDER — CHLORHEXIDINE GLUCONATE CLOTH 2 % EX PADS
6.0000 | MEDICATED_PAD | Freq: Every day | CUTANEOUS | Status: DC
  Administered 2024-03-03 – 2024-03-06 (×4): 6 via TOPICAL

## 2024-03-03 MED ORDER — INSULIN ASPART 100 UNIT/ML IJ SOLN
15.0000 [IU] | Freq: Once | INTRAMUSCULAR | Status: AC
  Administered 2024-03-03: 15 [IU] via SUBCUTANEOUS

## 2024-03-03 MED ORDER — POTASSIUM CHLORIDE 20 MEQ PO PACK
40.0000 meq | PACK | Freq: Once | ORAL | Status: AC
  Administered 2024-03-03: 40 meq via ORAL
  Filled 2024-03-03: qty 2

## 2024-03-03 NOTE — Plan of Care (Signed)

## 2024-03-03 NOTE — TOC Progression Note (Addendum)
 Transition of Care [facility]) - Progression Note    Patient Details  Name: [name] MRN: [id number] Date of Birth: 08/24/1960  Transition of Care [facility] CM/SW Contact  Isaiah Public, LCSWA Phone Number: 03/03/2024, 4:31 PM  Clinical Narrative:     Plan for patient to return to [facility] when medically ready. Logan with [facility] confirmed patient can return when medically ready.TOC will continue to follow.  Expected Discharge Plan: Skilled Nursing Facility Barriers to Discharge: Continued Medical Work up               Expected Discharge Plan and Services In-house Referral: Clinical Social Work Discharge Planning Services: CM Consult Post Acute Care Choice: Nursing Home Living arrangements for the past 2 months: Skilled Nursing Facility                                       Social Drivers of Health (SDOH) Interventions SDOH Screenings   Food Insecurity: No Food Insecurity (03/01/2024)  Housing: Low Risk  (03/01/2024)  Transportation Needs: No Transportation Needs (03/01/2024)  Utilities: Not At Risk (03/01/2024)  Depression (PHQ2-9): Low Risk  (03/16/2022)  Tobacco Use: Low Risk  (03/01/2024)    Readmission Risk Interventions     No data to display

## 2024-03-03 NOTE — Progress Notes (Signed)
 PROGRESS NOTE                                                                                                                                                                                                             Patient Demographics:    [name], is a 63 y.o. 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  Per report patient had 3 teeth extracted last week on Thursday and since then has been noted to have disorientation confusion.  Also reports cough.  And per report patient has been behaving not her usual baseline.  Patient also reported to have visual hallucinations.  Spoke to [name] and states that she was hollering and h/o strokes and has not been eating or drinking and was refusing her meds.   Subjective:    [name] today has, No headache, No chest pain, No abdominal pain - No Nausea, No new weakness tingling or numbness, no SOB   Assessment  & Plan :   Acute metabolic encephalopathy with some hallucinations and disorientation which happened after she had 3 teeth pulled about 3 days ago. So far no clear source except delirium from anesthesia that she received, MRI brain nonacute, no headache or focal deficits, will get EEG, negative CT maxillofacial and neck without any infectious source, minimize  narcotics and benzodiazepines, continue supportive care.  PT, exposed to sunlight and monitor.  She does have urinary retention on exam could be causing her encephalopathy.  Mentation is better but intermittently some hallucinations, monitor if continues then psych input.    Urinary retention.  Recurrent.  Flomax and Foley.  UA pending.  History of lymphoma and chronic anemia.  Continue on home medications venetoclax   Dehydration and AKI.  Improving with IV fluids continue to monitor.  Renal function likely close to baseline.  Hypokalemia replaced.  Hypertension.  On Norvasc  and hydralazine .  Hold ARB due to AKI.  HX of stroke.  No acute issues brain MRI negative, continue aspirin  and statin for secondary prevention.  History of hyperthyroidism.  Stable TSH, continue methimazole.  History of GERD.  PPI.  DM type II.  Lantus  and sliding scale.  Lab Results  Component Value Date   HGBA1C 6.7 (A) 09/06/2022   CBG (last 3)  Recent Labs    03/02/24 1610 03/02/24 2131 03/03/24 0800  GLUCAP 361* 284* 196*   Lab Results  Component Value Date   TSH 3.338 03/01/2024         Condition -  Guarded  Family Communication  :  sister [name] [phone]  on 03/02/24, 03/03/2024  Code Status : Full code  Consults  : None  PUD Prophylaxis : PPI   Procedures  :     MRI brain nonacute.  CT soft tissue neck and maxillofacial.      Disposition Plan  :    Status is: Observation   DVT Prophylaxis  :    heparin  injection 5,000 Units Start: 03/01/24 2200   Lab Results  Component Value Date   PLT 201 03/03/2024    Diet :  Diet Order             Diet heart healthy/carb modified Room service appropriate? Yes; Fluid consistency: Thin  Diet effective now                    Inpatient Medications  Scheduled Meds:  amLODipine   10 mg Oral Daily   aspirin  EC  81 mg Oral Daily   atorvastatin   80 mg Oral Daily   baclofen   5 mg Oral TID   ezetimibe   10 mg Oral Daily    feeding supplement  237 mL Oral BID BM   FLUoxetine   10 mg Oral Daily   heparin   5,000 Units Subcutaneous Q12H   hydrALAZINE   50 mg Oral Q8H   insulin  glargine-yfgn  10 Units Subcutaneous Daily   methimazole  5 mg Oral QHS   pantoprazole   40 mg Oral Daily   tamsulosin  0.4 mg Oral Daily   Continuous Infusions:  lactated ringers  75 mL/hr at 03/02/24 2343   PRN Meds:.acetaminophen  **OR** acetaminophen  (TYLENOL ) oral liquid 160 mg/5 mL **OR** acetaminophen , insulin  aspart, melatonin  Antibiotics  :    Anti-infectives (From admission, onward)    None         Objective:   Vitals:   03/02/24 2121 03/02/24 2331 03/03/24 0430 03/03/24 0526  BP: 113/74  116/74 129/79  Pulse:      Resp:      Temp:  97.7 F (36.5 C) 97.7 F (36.5 C)   TempSrc:  Axillary Axillary   SpO2:      Weight:        Wt Readings from Last 3 Encounters:  03/01/24 92 kg  10/10/23 72.6 kg  07/26/23 71.7 kg     Intake/Output Summary (Last 24 hours) at 03/03/2024 0806 Last data filed at 03/03/2024 0525 Gross per 24 hour  Intake 1993.78 ml  Output 650 ml  Net 1343.78 ml     Physical Exam  Awake, minimally confused, oriented x 2 no new F.N deficits,  [place].AT,PERRAL Supple Neck, No JVD,   Symmetrical Chest wall movement, Good air movement bilaterally, CTAB RRR,No Gallops,Rubs or new Murmurs,  +ve B.Sounds, Abd Soft, No tenderness,  No Cyanosis, Clubbing or edema     RN pressure injury documentation: Wound 03/01/24 2030 Pressure Injury Coccyx Medial Stage 2 -  Partial thickness loss of dermis presenting as a shallow open injury with a red, pink wound bed without slough. (Active)      Data Review:    Recent Labs  Lab 03/01/24 1505 03/01/24 1519 03/02/24 0422 03/02/24 0633 03/03/24 0434  WBC 5.3  --  5.5 5.8 5.9  HGB 12.3 11.9* 12.4 12.8 10.6*  HCT 37.7 35.0* 38.3 37.7 31.5*  PLT 255  --  238 254 201  MCV 83.2  --  83.4 81.8 81.2  MCH 27.2  --  27.0 27.8 27.3  MCHC 32.6  --  32.4  34.0 33.7  RDW 14.6  --  14.7 14.5 14.5  LYMPHSABS 1.0  --  1.4 1.6 1.8  MONOABS 0.5  --  0.8 0.7 0.8  EOSABS 0.0  --  0.1 0.1 0.2  BASOSABS 0.0  --  0.0 0.0 0.0    Recent Labs  Lab 03/01/24 1505 03/01/24 1519 03/01/24 2345 03/02/24 0422 03/02/24 0630 03/02/24 0633 03/03/24 0434  NA 140 142  --  141  --  140 136  K 4.4 4.4  --  3.7  --  3.7 3.4*  CL 104  --   --  104  --  101 101  CO2 20*  --   --  22  --  24 25  ANIONGAP 16*  --   --  15  --  15 10  GLUCOSE 157*  --   --  138*  --  137* 221*  BUN 25*  --   --  26*  --  26* 35*  CREATININE 1.18*  --   --  1.06*  --  1.13* 1.29*  AST 24  --   --  24  --   --   --   ALT 22  --   --  25  --   --   --   ALKPHOS 69  --   --  68  --   --   --   BILITOT 1.5*  --   --  1.1  --   --   --   ALBUMIN 3.2*  --   --  3.2*  --   --   --   CRP  --   --   --   --  0.6  --  <0.5  PROCALCITON  --   --   --   --  1.80  --  2.49  TSH  --   --  3.338  --   --   --   --   AMMONIA 23  --   --   --   --  14  --   MG  --   --  1.8 1.8  --  1.9 1.8  PHOS  --   --   --  2.8  --   --   --   CALCIUM  10.0  --   --  10.3  --  10.4* 9.3      Recent Labs  Lab 03/01/24 1505 03/01/24 2345 03/02/24 0422 03/02/24 0630 03/02/24 0633 03/03/24 0434  CRP  --   --   --  0.6  --  <0.5  PROCALCITON  --   --   --  1.80  --  2.49  TSH  --  3.338  --   --   --   --  AMMONIA 23  --   --   --  14  --   MG  --  1.8 1.8  --  1.9 1.8  CALCIUM  10.0  --  10.3  --  10.4* 9.3    --------------------------------------------------------------------------------------------------------------- Lab Results  Component Value Date   CHOL 132 03/02/2024   HDL 44 03/02/2024   LDLCALC 69 03/02/2024   LDLDIRECT 175.9 03/02/2012   TRIG 95 03/02/2024   CHOLHDL 3.0 03/02/2024    Lab Results  Component Value Date   HGBA1C 6.7 (A) 09/06/2022   Recent Labs    03/01/24 2345  TSH 3.338  FREET4 0.89   Recent Labs    03/02/24 0633  VITAMINB12 2,220*    ------------------------------------------------------------------------------------------------------------------ Cardiac Enzymes No results for input(s): CKMB, TROPONINI, MYOGLOBIN in the last 168 hours.  Invalid input(s): CK  Micro Results Recent Results (from the past 240 hours)  Resp panel by RT-PCR (RSV, Flu A&B, Covid) Anterior Nasal Swab     Status: None   Collection Time: 03/01/24  4:44 PM   Specimen: Anterior Nasal Swab  Result Value Ref Range Status   SARS Coronavirus 2 by RT PCR NEGATIVE NEGATIVE Final   Influenza A by PCR NEGATIVE NEGATIVE Final   Influenza B by PCR NEGATIVE NEGATIVE Final    Comment: (NOTE) The Xpert Xpress SARS-CoV-2/FLU/RSV plus assay is intended as an aid in the diagnosis of influenza from Nasopharyngeal swab specimens and should not be used as a sole basis for treatment. Nasal washings and aspirates are unacceptable for Xpert Xpress SARS-CoV-2/FLU/RSV testing.  Fact Sheet for Patients: bloggercourse.com  Fact Sheet for Healthcare Providers: seriousbroker.it  This test is not yet approved or cleared by the [place]  FDA and has been authorized for detection and/or diagnosis of SARS-CoV-2 by FDA under an Emergency Use Authorization (EUA). This EUA will remain in effect (meaning this test can be used) for the duration of the COVID-19 declaration under Section 564(b)(1) of the Act, 21 U.S.C. section 360bbb-3(b)(1), unless the authorization is terminated or revoked.     Resp Syncytial Virus by PCR NEGATIVE NEGATIVE Final    Comment: (NOTE) Fact Sheet for Patients: bloggercourse.com  Fact Sheet for Healthcare Providers: seriousbroker.it  This test is not yet approved or cleared by the [place]  FDA and has been authorized for detection and/or diagnosis of SARS-CoV-2 by FDA under an Emergency Use Authorization (EUA). This EUA  will remain in effect (meaning this test can be used) for the duration of the COVID-19 declaration under Section 564(b)(1) of the Act, 21 U.S.C. section 360bbb-3(b)(1), unless the authorization is terminated or revoked.  Performed at [facility], [address] [place], [place] [address]     Radiology Report EEG adult Result Date: 03/02/2024 Shelton Arlin KIDD, MD     03/02/2024  2:12 PM Patient Name: [name] MRN: [id number] Epilepsy Attending: Arlin KIDD Shelton Referring Physician/Provider: Dennise Lavada POUR, MD Date:  03/02/2024 Duration: 22.23 mins Patient history: 63yo f with ams. EEG to evaluate for seizure Level of alertness: Awake AEDs during EEG study: None Technical aspects: This EEG study was done with scalp electrodes positioned according to the 10-20 International system of electrode placement. Electrical activity was reviewed with band pass filter of 1-70Hz , sensitivity of 7 uV/mm, display speed of 97mm/sec with a 60Hz  notched filter applied as appropriate. EEG data were recorded continuously and digitally stored.  Video monitoring was available and reviewed as appropriate. Description: The posterior dominant rhythm consists of 8Hz  activity of moderate  voltage (25-35 uV) seen predominantly in posterior head regions, symmetric and reactive to eye opening and eye closing. Hyperventilation and photic stimulation were not performed.   IMPRESSION: This study is within normal limits. No seizures or epileptiform discharges were seen throughout the recording. A normal interictal EEG does not exclude the diagnosis of epilepsy. Arlin MALVA Krebs   CT SOFT TISSUE NECK W CONTRAST Result Date: 03/02/2024 EXAM: CT NECK WITH CONTRAST 03/02/2024 09:42:00 AM TECHNIQUE: CT of the neck was performed with the administration of 75 mL of iohexol  (OMNIPAQUE ) 350 MG/ML injection. Multiplanar reformatted images are provided for review. Automated exposure control, iterative reconstruction, and/or weight  based adjustment of the mA/kV was utilized to reduce the radiation dose to as low as reasonably achievable. COMPARISON: [id number] CLINICAL HISTORY: Soft tissue infection suspected, neck, xray done; recent teeth extraction with facial and neck pain. FINDINGS: AERODIGESTIVE TRACT: No discrete mass. No edema. SALIVARY GLANDS: The parotid and submandibular glands are unremarkable. THYROID : Heterogeneous and enlarged right thyroid  lobe in a 63 year old patient. LYMPH NODES: No suspicious cervical lymphadenopathy. SOFT TISSUES: No mass or fluid collection. BRAIN, ORBITS, SINUSES AND MASTOIDS: No acute abnormality. LUNGS AND MEDIASTINUM: No acute abnormality. BONES: Poor dentition with dental caries but no periapical abscess. No focal bone abnormality. VASCULATURE: Aberrant right subclavian artery. IMPRESSION: 1. No acute findings. 2. Heterogeneous and enlarged right thyroid  lobe; recommend non-emergent thyroid  ultrasound. Electronically signed by: Franky Stanford MD 03/02/2024 10:08 AM EDT RP Workstation: [id number]   MR BRAIN WO CONTRAST Result Date: 03/01/2024 CLINICAL DATA:  Initial evaluation for acute mental status change, unknown cause. EXAM: MRI HEAD WITHOUT CONTRAST TECHNIQUE: Multiplanar, multiecho pulse sequences of the brain and surrounding structures were obtained without intravenous contrast. COMPARISON:  Comparison made with CT performed earlier the same day as well as previous MRI from 01/05/2023. FINDINGS: Brain: Cerebral volume within normal limits. Patchy T2/FLAIR hyperintensity involving the periventricular and deep white matter both cerebral hemispheres, consistent with chronic small vessel ischemic disease, similar prior. Chronic encephalomalacia involving the ventral medulla again noted, stable. Few scattered small remote bilateral cerebellar infarcts, unchanged. No abnormal foci of restricted diffusion to suggest acute or subacute ischemia. No areas of interval cortical infarction. No acute intracranial  hemorrhage. Few small chronic micro hemorrhages noted about the right cerebellum, right thalamus, and right occipital lobe, likely small vessel related. No mass lesion, midline shift or mass effect. No hydrocephalus or extra-axial fluid collection. Pituitary gland within normal limits. Vascular: Loss of normal flow voids within the right greater than left intradural V4 segments to the vertebrobasilar junction. Preserved flow void distally within the basilar artery. Overall, appearance is relatively stable as compared to previous exam. Preserved normal flow voids within the anterior circulation. Skull and upper cervical spine: Craniocervical junction within normal limits. Bone marrow signal intensity overall within normal limits. No scalp soft tissue abnormality. Sinuses/Orbits: Globes orbital soft tissues within normal limits. Paranasal sinuses are largely clear. Trace left mastoid effusion noted, of doubtful significance. Other: None. IMPRESSION: 1. No acute intracranial abnormality. 2. Chronic encephalomalacia involving the ventral medulla, with a few scattered small remote bilateral cerebellar infarcts, stable. 3. Loss of normal flow voids within the right greater than left intradural V4 segments to the vertebrobasilar junction, stable from previous. 4. Underlying chronic microvascular ischemic disease, stable. Electronically Signed   By: Morene Hoard M.D.   On: 03/01/2024 20:27   DG Chest Portable 1 View Result Date: 03/01/2024 CLINICAL DATA:  Cough, recent dental procedure, hallucinations EXAM: PORTABLE CHEST 1  VIEW COMPARISON:  03/28/2023 FINDINGS: Single frontal view of the chest demonstrates a stable cardiac silhouette. Linear scarring at the left lung base unchanged. No airspace disease, effusion, or pneumothorax. No acute bony abnormalities. IMPRESSION: 1. Stable chest, no acute process. Electronically Signed   By: Ozell Daring M.D.   On: 03/01/2024 15:28   CT HEAD WO CONTRAST Result Date:  03/01/2024 EXAM: CT HEAD WITHOUT CONTRAST 03/01/2024 02:15:00 PM TECHNIQUE: CT of the head was performed without the administration of intravenous contrast. Automated exposure control, iterative reconstruction, and/or weight based adjustment of the mA/kV was utilized to reduce the radiation dose to as low as reasonably achievable. COMPARISON: 01/05/2023 CLINICAL HISTORY: Mental status change, unknown cause. FINDINGS: BRAIN AND VENTRICLES: No acute hemorrhage. No evidence of acute infarct. No hydrocephalus. No extra-axial collection. No mass effect or midline shift. Calcified atherosclerotic plaque within cavernous/supraclinoid internal carotid arteries. ORBITS: No acute abnormality. SINUSES: No acute abnormality. SOFT TISSUES AND SKULL: No acute soft tissue abnormality. No skull fracture. IMPRESSION: 1. No acute intracranial abnormality. 2. Intracranial internal carotid artery atherosclerosis with calcified plaque within the cavernous and supraclinoid segments. Electronically signed by: Franky Stanford MD 03/01/2024 03:02 PM EDT RP Workstation: [id number]     Signature  -   Lavada Stank M.D on 03/03/2024 at 8:06 AM   -  To page go to [email or web address]

## 2024-03-03 NOTE — Plan of Care (Signed)
  Problem: Nutrition: Goal: Risk of aspiration will decrease Outcome: Progressing   Problem: Education: Goal: Knowledge of General Education information will improve Description: Including pain rating scale, medication(s)/side effects and non-pharmacologic comfort measures Outcome: Progressing   Problem: Clinical Measurements: Goal: Will remain free from infection Outcome: Progressing   Problem: Nutrition: Goal: Adequate nutrition will be maintained Outcome: Progressing   Problem: Safety: Goal: Ability to remain free from injury will improve Outcome: Progressing   Problem: Skin Integrity: Goal: Risk for impaired skin integrity will decrease Outcome: Progressing

## 2024-03-04 DIAGNOSIS — R410 Disorientation, unspecified: Secondary | ICD-10-CM | POA: Diagnosis not present

## 2024-03-04 LAB — BASIC METABOLIC PANEL WITH GFR
Anion gap: 9 (ref 5–15)
BUN: 43 mg/dL — ABNORMAL HIGH (ref 8–23)
CO2: 25 mmol/L (ref 22–32)
Calcium: 9.3 mg/dL (ref 8.9–10.3)
Chloride: 101 mmol/L (ref 98–111)
Creatinine, Ser: 1.86 mg/dL — ABNORMAL HIGH (ref 0.44–1.00)
GFR, Estimated: 30 mL/min — ABNORMAL LOW (ref >60)
Glucose, Bld: 241 mg/dL — ABNORMAL HIGH (ref 70–99)
Potassium: 4.2 mmol/L (ref 3.5–5.1)
Sodium: 135 mmol/L (ref 135–145)

## 2024-03-04 LAB — GLUCOSE, CAPILLARY
Glucose-Capillary: 185 mg/dL — ABNORMAL HIGH (ref 70–99)
Glucose-Capillary: 256 mg/dL — ABNORMAL HIGH (ref 70–99)
Glucose-Capillary: 235 mg/dL — ABNORMAL HIGH (ref 70–99)
Glucose-Capillary: 270 mg/dL — ABNORMAL HIGH (ref 70–99)

## 2024-03-04 LAB — CBC WITH DIFFERENTIAL/PLATELET
Abs Immature Granulocytes: 0.05 K/uL (ref 0.00–0.07)
Basophils Absolute: 0 K/uL (ref 0.0–0.1)
Basophils Relative: 1 %
Eosinophils Absolute: 0.1 K/uL (ref 0.0–0.5)
Eosinophils Relative: 2 %
HCT: 30.4 % — ABNORMAL LOW (ref 36.0–46.0)
Hemoglobin: 10.1 g/dL — ABNORMAL LOW (ref 12.0–15.0)
Immature Granulocytes: 1 %
Lymphocytes Relative: 21 %
Lymphs Abs: 1.1 K/uL (ref 0.7–4.0)
MCH: 27.2 pg (ref 26.0–34.0)
MCHC: 33.2 g/dL (ref 30.0–36.0)
MCV: 81.7 fL (ref 80.0–100.0)
Monocytes Absolute: 0.7 K/uL (ref 0.1–1.0)
Monocytes Relative: 13 %
Neutro Abs: 3.5 K/uL (ref 1.7–7.7)
Neutrophils Relative %: 62 %
Platelets: 197 K/uL (ref 150–400)
RBC: 3.72 MIL/uL — ABNORMAL LOW (ref 3.87–5.11)
RDW: 14.6 % (ref 11.5–15.5)
WBC: 5.5 K/uL (ref 4.0–10.5)
nRBC: 0 % (ref 0.0–0.2)

## 2024-03-04 LAB — HEMOGLOBIN A1C
Hgb A1c MFr Bld: 7.6 % — ABNORMAL HIGH (ref 4.8–5.6)
Mean Plasma Glucose: 171.42 mg/dL

## 2024-03-04 LAB — C-REACTIVE PROTEIN: CRP: 0.5 mg/dL (ref <1.0)

## 2024-03-04 LAB — MAGNESIUM: Magnesium: 1.9 mg/dL (ref 1.7–2.4)

## 2024-03-04 LAB — PROCALCITONIN: Procalcitonin: 2.65 ng/mL

## 2024-03-04 MED ORDER — INSULIN ASPART 100 UNIT/ML IJ SOLN
0.0000 [IU] | Freq: Every day | INTRAMUSCULAR | Status: DC
  Administered 2024-03-04: 3 [IU] via SUBCUTANEOUS
  Filled 2024-03-04: qty 3

## 2024-03-04 MED ORDER — INSULIN ASPART 100 UNIT/ML IJ SOLN
0.0000 [IU] | Freq: Three times a day (TID) | INTRAMUSCULAR | Status: DC
  Administered 2024-03-04: 8 [IU] via SUBCUTANEOUS
  Administered 2024-03-04: 3 [IU] via SUBCUTANEOUS
  Administered 2024-03-04: 5 [IU] via SUBCUTANEOUS
  Administered 2024-03-05: 15 [IU] via SUBCUTANEOUS
  Administered 2024-03-05: 8 [IU] via SUBCUTANEOUS
  Administered 2024-03-05: 5 [IU] via SUBCUTANEOUS
  Administered 2024-03-06: 2 [IU] via SUBCUTANEOUS
  Filled 2024-03-04: qty 8
  Filled 2024-03-04: qty 3
  Filled 2024-03-04: qty 15
  Filled 2024-03-04 (×3): qty 5
  Filled 2024-03-04: qty 2

## 2024-03-04 MED ORDER — LACTATED RINGERS IV SOLN: INTRAVENOUS | Status: AC

## 2024-03-04 MED ORDER — GLUCERNA SHAKE PO LIQD
237.0000 mL | Freq: Two times a day (BID) | ORAL | Status: DC
  Administered 2024-03-05 – 2024-03-06 (×3): 237 mL via ORAL

## 2024-03-04 MED ORDER — INSULIN GLARGINE-YFGN 100 UNIT/ML ~~LOC~~ SOLN
20.0000 [IU] | Freq: Every day | SUBCUTANEOUS | Status: DC
  Administered 2024-03-04 – 2024-03-06 (×3): 20 [IU] via SUBCUTANEOUS
  Filled 2024-03-04 (×3): qty 0.2

## 2024-03-04 MED ORDER — ADULT MULTIVITAMIN W/MINERALS CH
1.0000 | ORAL_TABLET | Freq: Every day | ORAL | Status: DC
  Administered 2024-03-04 – 2024-03-06 (×3): 1 via ORAL
  Filled 2024-03-04 (×3): qty 1

## 2024-03-04 NOTE — Plan of Care (Signed)

## 2024-03-04 NOTE — Plan of Care (Signed)
  Problem: Coping: Goal: Ability to adjust to condition or change in health will improve Outcome: Progressing   Problem: Health Behavior/Discharge Planning: Goal: Ability to manage health-related needs will improve Outcome: Progressing   Problem: Nutritional: Goal: Maintenance of adequate nutrition will improve Outcome: Progressing   Problem: Coping: Goal: Will verbalize positive feelings about self Outcome: Progressing   Problem: Nutrition: Goal: Risk of aspiration will decrease Outcome: Progressing   Problem: Clinical Measurements: Goal: Will remain free from infection Outcome: Progressing

## 2024-03-04 NOTE — Progress Notes (Signed)
 PROGRESS NOTE                                                                                                                                                                                                             Patient Demographics:    [name], is a 63 y.o. 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  Per report patient had 3 teeth extracted last week on Thursday and since then has been noted to have disorientation confusion.  Also reports cough.  And per report patient has been behaving not her usual baseline.  Patient also reported to have visual hallucinations.  Spoke to ms brenda and states that she was hollering and h/o strokes and has not been eating or drinking and was refusing her meds.   Subjective:   Patient in bed, appears comfortable, denies any headache, no fever, no chest pain or pressure, no shortness of breath , no abdominal pain. No focal weakness.   Assessment  & Plan :   Acute metabolic encephalopathy with some hallucinations and disorientation which happened after she had 3 teeth pulled about 3 days ago. So far no clear source except delirium from anesthesia that she received, MRI brain nonacute, no headache or focal deficits, nonacute EEG, negative CT maxillofacial and neck without any  infectious source, minimize narcotics and benzodiazepines, continue supportive care.  PT, mentation improving with supportive care now, continue to expose to sunlight, advance activity and monitor.  She does have urinary retention on exam could be causing her encephalopathy.  Mentation is better but intermittently some hallucinations, monitor if continues then psych input.    Urinary retention.  Recurrent.  Flomax and Foley.  UA nonspecific but not impressive.  History of lymphoma and chronic anemia.  Continue on home medications  venetoclax   Dehydration, bladder outlet obstruction and AKI.  Already on Foley Flomax, hydrate and monitor.  If does not improve will get renal ultrasound, hold ARB and diuretics.  Hypertension.  On Norvasc  and hydralazine .  Hold ARB due to AKI.  HX of stroke.  No acute issues brain MRI negative, continue aspirin  and statin for secondary prevention.  History of hyperthyroidism.  Stable TSH, continue methimazole.  CT showed an enlarged right thyroid  lobe, will defer to outpatient endocrinologist and patient's PCP.  History of GERD.  PPI.  DM type II.  Lantus  and sliding scale.  Lab Results  Component Value Date   HGBA1C 7.6 (H) 03/04/2024   CBG (last 3)  Recent Labs    03/03/24 1616 03/03/24 2200 03/04/24 0757  GLUCAP 296* 261* 185*   Lab Results  Component Value Date   TSH 3.338 03/01/2024         Condition -  Guarded  Family Communication  :  sister [name] [phone]  on 03/02/24, 03/03/2024, 03/04/2024  Code Status : Full code  Consults  : None  PUD Prophylaxis : PPI   Procedures  :     MRI brain nonacute.  CT soft tissue neck and maxillofacial. 1. No acute findings. 2. Heterogeneous and enlarged right thyroid  lobe; recommend non-emergent thyroid  ultrasound.      Disposition Plan  :    Status is: Observation   DVT Prophylaxis  :    heparin  injection 5,000 Units Start: 03/01/24 2200   Lab Results  Component Value Date    PLT 197 03/04/2024    Diet :  Diet Order             Diet heart healthy/carb modified Room service appropriate? Yes; Fluid consistency: Thin  Diet effective now                    Inpatient Medications  Scheduled Meds:  amLODipine   10 mg Oral Daily   aspirin  EC  81 mg Oral Daily   atorvastatin   80 mg Oral Daily   baclofen   5 mg Oral TID   Chlorhexidine  Gluconate Cloth  6 each Topical Daily   ezetimibe   10 mg Oral Daily   feeding supplement  237 mL Oral BID BM   FLUoxetine   10 mg Oral Daily   heparin   5,000 Units Subcutaneous Q12H   hydrALAZINE   50 mg Oral Q8H   insulin  aspart  0-15 Units Subcutaneous TID WC   insulin  aspart  0-5 Units Subcutaneous QHS   insulin  glargine-yfgn  20 Units Subcutaneous Daily   methimazole  5 mg Oral QHS   pantoprazole   40 mg Oral Daily   tamsulosin  0.4 mg Oral Daily   Continuous Infusions:  lactated ringers      PRN Meds:.acetaminophen  **OR** acetaminophen  (TYLENOL ) oral liquid 160 mg/5 mL **OR** acetaminophen , melatonin  Antibiotics  :    Anti-infectives (From admission, onward)    None         Objective:   Vitals:   03/04/24 0005 03/04/24 0400 03/04/24 0523 03/04/24 0719  BP:  121/75 135/73 130/67  Pulse: 93 84    Resp: (!) 21 16    Temp:  97.6 F (36.4 C)  98 F (36.7 C)  TempSrc:  Axillary  Oral  SpO2: 100% 100%    Weight:        Wt Readings from Last 3 Encounters:  03/01/24 92 kg  10/10/23 72.6 kg  07/26/23 71.7 kg     Intake/Output  Summary (Last 24 hours) at 03/04/2024 0810 Last data filed at 03/04/2024 0450 Gross per 24 hour  Intake 2359.78 ml  Output 300 ml  Net 2059.78 ml     Physical Exam  Awake, much more alert, no new F.N deficits, Foley catheter in place .AT,PERRAL Supple Neck, No JVD,   Symmetrical Chest wall movement, Good air movement bilaterally, CTAB RRR,No Gallops,Rubs or new Murmurs,  +ve B.Sounds, Abd Soft, No tenderness,   No Cyanosis, Clubbing or edema     RN pressure  injury documentation: Wound 03/01/24 2030 Pressure Injury Coccyx Medial Stage 2 -  Partial thickness loss of dermis presenting as a shallow open injury with a red, pink wound bed without slough. (Active)      Data Review:    Recent Labs  Lab 03/01/24 1505 03/01/24 1519 03/02/24 0422 03/02/24 9366 03/03/24 0434 03/04/24 0459  WBC 5.3  --  5.5 5.8 5.9 5.5  HGB 12.3 11.9* 12.4 12.8 10.6* 10.1*  HCT 37.7 35.0* 38.3 37.7 31.5* 30.4*  PLT 255  --  238 254 201 197  MCV 83.2  --  83.4 81.8 81.2 81.7  MCH 27.2  --  27.0 27.8 27.3 27.2  MCHC 32.6  --  32.4 34.0 33.7 33.2  RDW 14.6  --  14.7 14.5 14.5 14.6  LYMPHSABS 1.0  --  1.4 1.6 1.8 1.1  MONOABS 0.5  --  0.8 0.7 0.8 0.7  EOSABS 0.0  --  0.1 0.1 0.2 0.1  BASOSABS 0.0  --  0.0 0.0 0.0 0.0    Recent Labs  Lab 03/01/24 1505 03/01/24 1519 03/01/24 2345 03/02/24 0422 03/02/24 0630 03/02/24 0633 03/03/24 0434 03/04/24 0459 03/04/24 0534  NA 140 142  --  141  --  140 136 135  --   K 4.4 4.4  --  3.7  --  3.7 3.4* 4.2  --   CL 104  --   --  104  --  101 101 101  --   CO2 20*  --   --  22  --  24 25 25   --   ANIONGAP 16*  --   --  15  --  15 10 9   --   GLUCOSE 157*  --   --  138*  --  137* 221* 241*  --   BUN 25*  --   --  26*  --  26* 35* 43*  --   CREATININE 1.18*  --   --  1.06*  --  1.13* 1.29* 1.86*  --   AST 24  --   --  24  --   --   --   --   --   ALT 22  --   --  25  --   --   --   --   --   ALKPHOS 69  --   --  68  --   --   --   --   --   BILITOT 1.5*  --   --  1.1  --   --   --   --   --   ALBUMIN 3.2*  --   --  3.2*  --   --   --   --   --   CRP  --   --   --   --  0.6  --  <0.5 0.5  --   PROCALCITON  --   --   --   --  1.80  --  2.49 2.65  --   TSH  --   --  3.338  --   --   --   --   --   --   HGBA1C  --   --   --   --   --   --   --   --  7.6*  AMMONIA 23  --   --   --   --  14  --   --   --   MG  --   --  1.8 1.8  --  1.9 1.8 1.9  --   PHOS  --   --   --  2.8  --   --   --   --   --   CALCIUM  10.0  --   --   10.3  --  10.4* 9.3 9.3  --       Recent Labs  Lab 03/01/24 1505 03/01/24 2345 03/02/24 0422 03/02/24 0630 03/02/24 0633 03/03/24 0434 03/04/24 0459 03/04/24 0534  CRP  --   --   --  0.6  --  <0.5 0.5  --   PROCALCITON  --   --   --  1.80  --  2.49 2.65  --   TSH  --  3.338  --   --   --   --   --   --   HGBA1C  --   --   --   --   --   --   --  7.6*  AMMONIA 23  --   --   --  14  --   --   --   MG  --  1.8 1.8  --  1.9 1.8 1.9  --   CALCIUM  10.0  --  10.3  --  10.4* 9.3 9.3  --     --------------------------------------------------------------------------------------------------------------- Lab Results  Component Value Date   CHOL 132 03/02/2024   HDL 44 03/02/2024   LDLCALC 69 03/02/2024   LDLDIRECT 175.9 03/02/2012   TRIG 95 03/02/2024   CHOLHDL 3.0 03/02/2024    Lab Results  Component Value Date   HGBA1C 7.6 (H) 03/04/2024   Recent Labs    03/01/24 2345  TSH 3.338  FREET4 0.89   Recent Labs    03/02/24 0633  VITAMINB12 2,220*   ------------------------------------------------------------------------------------------------------------------ Cardiac Enzymes No results for input(s): CKMB, TROPONINI, MYOGLOBIN in the last 168 hours.  Invalid input(s): CK  Micro Results Recent Results (from the past 240 hours)  Resp panel by RT-PCR (RSV, Flu A&B, Covid) Anterior Nasal Swab     Status: None   Collection Time: 03/01/24  4:44 PM   Specimen: Anterior Nasal Swab  Result Value Ref Range Status   SARS Coronavirus 2 by RT PCR NEGATIVE NEGATIVE Final   Influenza A by PCR NEGATIVE NEGATIVE Final   Influenza B by PCR NEGATIVE NEGATIVE Final    Comment: (NOTE) The Xpert Xpress SARS-CoV-2/FLU/RSV plus assay is intended as an aid in the diagnosis of influenza from Nasopharyngeal swab specimens and should not be used as a sole basis for treatment. Nasal washings and aspirates are unacceptable for Xpert Xpress SARS-CoV-2/FLU/RSV testing.  Fact Sheet  for Patients: bloggercourse.com  Fact Sheet for Healthcare Providers: seriousbroker.it  This test is not yet approved or cleared by the [place]  FDA and has been authorized for detection and/or diagnosis of SARS-CoV-2 by FDA under an Emergency Use Authorization (EUA). This EUA will remain in effect (  meaning this test can be used) for the duration of the COVID-19 declaration under Section 564(b)(1) of the Act, 21 U.S.C. section 360bbb-3(b)(1), unless the authorization is terminated or revoked.     Resp Syncytial Virus by PCR NEGATIVE NEGATIVE Final    Comment: (NOTE) Fact Sheet for Patients: bloggercourse.com  Fact Sheet for Healthcare Providers: seriousbroker.it  This test is not yet approved or cleared by the [place]  FDA and has been authorized for detection and/or diagnosis of SARS-CoV-2 by FDA under an Emergency Use Authorization (EUA). This EUA will remain in effect (meaning this test can be used) for the duration of the COVID-19 declaration under Section 564(b)(1) of the Act, 21 U.S.C. section 360bbb-3(b)(1), unless the authorization is terminated or revoked.  Performed at [facility], [address], [place], [place] [address]     Radiology Report EEG adult Result Date: 03/02/2024 Shelton Arlin KIDD, MD     03/02/2024  2:12 PM Patient Name: Mattilyn Crites MRN: [id number] Epilepsy Attending: Arlin KIDD Shelton Referring Physician/Provider: Dennise Lavada POUR, MD Date:  03/02/2024 Duration: 22.23 mins Patient history: 63yo f with ams. EEG to evaluate for seizure Level of alertness: Awake AEDs during EEG study: None Technical aspects: This EEG study was done with scalp electrodes positioned according to the 10-20 International system of electrode placement. Electrical activity was reviewed with band pass filter of 1-70Hz , sensitivity of 7 uV/mm, display speed of  64mm/sec with a 60Hz  notched filter applied as appropriate. EEG data were recorded continuously and digitally stored.  Video monitoring was available and reviewed as appropriate. Description: The posterior dominant rhythm consists of 8Hz  activity of moderate voltage (25-35 uV) seen predominantly in posterior head regions, symmetric and reactive to eye opening and eye closing. Hyperventilation and photic stimulation were not performed.   IMPRESSION: This study is within normal limits. No seizures or epileptiform discharges were seen throughout the recording. A normal interictal EEG does not exclude the diagnosis of epilepsy. Arlin KIDD Shelton   CT SOFT TISSUE NECK W CONTRAST Result Date: 03/02/2024 EXAM: CT NECK WITH CONTRAST 03/02/2024 09:42:00 AM TECHNIQUE: CT of the neck was performed with the administration of 75 mL of iohexol  (OMNIPAQUE ) 350 MG/ML injection. Multiplanar reformatted images are provided for review. Automated exposure control, iterative reconstruction, and/or weight based adjustment of the mA/kV was utilized to reduce the radiation dose to as low as reasonably achievable. COMPARISON: 9524 CLINICAL HISTORY: Soft tissue infection suspected, neck, xray done; recent teeth extraction with facial and neck pain. FINDINGS: AERODIGESTIVE TRACT: No discrete mass. No edema. SALIVARY GLANDS: The parotid and submandibular glands are unremarkable. THYROID : Heterogeneous and enlarged right thyroid  lobe in a 63 year old patient. LYMPH NODES: No suspicious cervical lymphadenopathy. SOFT TISSUES: No mass or fluid collection. BRAIN, ORBITS, SINUSES AND MASTOIDS: No acute abnormality. LUNGS AND MEDIASTINUM: No acute abnormality. BONES: Poor dentition with dental caries but no periapical abscess. No focal bone abnormality. VASCULATURE: Aberrant right subclavian artery. IMPRESSION: 1. No acute findings. 2. Heterogeneous and enlarged right thyroid  lobe; recommend non-emergent thyroid  ultrasound. Electronically signed  by: Franky Stanford MD 03/02/2024 10:08 AM EDT RP Workstation: [id number]     Signature  -   Lavada Dennise M.D on 03/04/2024 at 8:10 AM   -  To page go to [email or web address]

## 2024-03-04 NOTE — TOC Progression Note (Signed)
 Transition of Care [facility]) - Progression Note    Patient Details  Name: [name] MRN: [id number] Date of Birth: [birth year]  Transition of Care [facility] CM/SW Contact  Inocente GORMAN Kindle, LCSW Phone Number: 03/04/2024, 4:18 PM  Clinical Narrative:    CSW notified Maple Sylvie of patient's likely discharge tomorrow.    Expected Discharge Plan: Skilled Nursing Facility Barriers to Discharge: Continued Medical Work up               Expected Discharge Plan and Services In-house Referral: Clinical Social Work Discharge Planning Services: CM Consult Post Acute Care Choice: Nursing Home Living arrangements for the past 2 months: Skilled Nursing Facility                                       Social Drivers of Health (SDOH) Interventions SDOH Screenings   Food Insecurity: No Food Insecurity (03/01/2024)  Housing: Low Risk  (03/01/2024)  Transportation Needs: No Transportation Needs (03/01/2024)  Utilities: Not At Risk (03/01/2024)  Depression (PHQ2-9): Low Risk  (03/16/2022)  Tobacco Use: Low Risk  (03/01/2024)    Readmission Risk Interventions     No data to display

## 2024-03-04 NOTE — NC FL2 (Signed)
 [place]  MEDICAID FL2 LEVEL OF CARE FORM     IDENTIFICATION  Patient Name: [name] Birthdate: [birth year] Sex: female Admission Date (Current Location): 03/01/2024  [place] Number:  [place] [id number] T Facility and Address:  [facility], [address], [place], [place] [address]      Provider Number: [id number]  Attending Physician Name and Address:  Dennise Lavada POUR, MD  Relative Name and Phone Number:  [name] Glatter (sister) [phone]    Current Level of Care: Hospital Recommended Level of Care: Skilled Nursing Facility Prior Approval Number:    Date Approved/Denied:   PASRR Number: [id number]  Discharge Plan: SNF    Current Diagnoses: Patient Active Problem List   Diagnosis Date Noted   Disorientation 03/01/2024   Protein-calorie malnutrition, severe 10/08/2022   Severe sepsis (HCC) 10/05/2022   Dehydration 10/05/2022   Acute prerenal azotemia 10/05/2022   Proctitis 10/05/2022   Hypercalcemia 10/05/2022   Transaminitis 10/05/2022   Pyuria 10/05/2022   Depression 10/05/2022   CAP (community acquired pneumonia) 10/04/2022   Malnutrition of moderate degree 09/26/2022   SOB (shortness of breath) 09/25/2022   Nausea vomiting and diarrhea 09/24/2022   Shortness of breath 09/24/2022   Acute respiratory failure with hypoxia (HCC) 09/24/2022   LFT elevation 09/24/2022   Diabetes mellitus (HCC) 09/06/2022   Rectal ulcer 07/09/2022   Colon ulcer 07/08/2022   Rectal bleeding 07/07/2022   Anemia 07/07/2022   GI bleed 07/06/2022   Dysarthria 06/12/2022   Aspiration pneumonia (HCC) 06/12/2022   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   Anxiety disorder due to medical condition 06/10/2022   AKI (acute kidney injury) 06/01/2022   Dizziness 05/29/2022   Personal history of CLL (chronic lymphocytic leukemia) 05/29/2022   Rectal pain 05/29/2022   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 disease, with long-term  current use of insulin  (HCC) 04/14/2022   Type 2 diabetes mellitus with diabetic polyneuropathy, with long-term current use of insulin  (HCC) 04/14/2022   Antiplatelet or antithrombotic long-term use 03/16/2022   HLD (hyperlipidemia) 10/01/2021   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HCC) 09/17/2021   Stage 3a chronic kidney disease (CKD) (HCC) 09/16/2021   Thyroid  nodule 09/16/2021   Cervical lymphadenopathy 09/16/2021   Acute CVA (cerebrovascular accident) (HCC) 09/15/2021   GERD (gastroesophageal reflux disease) 04/30/2021   Peripheral arterial disease 04/13/2021   Atherosclerosis of aorta 02/14/2021   Cerebral thrombosis with cerebral infarction 02/08/2021   Abnormal MRI of head 02/07/2021   Vertigo 02/07/2021   Class 1 obesity due to excess calories with body mass index (BMI) of 31.0 to 31.9 in adult 02/07/2021   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   Trigger finger, left ring finger 10/04/2018   Alternating constipation and diarrhea 04/20/2018   Diabetic peripheral neuropathy associated with type 2 diabetes mellitus (HCC) 02/26/2018   DM2 (diabetes mellitus, type 2) (HCC) 07/29/2015   ABNORMALITY OF GAIT 05/10/2010   ECZEMA, ATOPIC 04/03/2009   Hyperlipidemia associated with type 2 diabetes mellitus (HCC) 03/06/2009   BACK PAIN 11/14/2008   Hemiparesis affecting left side as late effect of stroke (HCC) 08/05/2008   DIPLOPIA 07/15/2008   Essential hypertension 07/15/2008    Orientation RESPIRATION BLADDER Height & Weight     Self, Time, Place  Normal Incontinent, Indwelling catheter Weight: 202 lb 13.2 oz (92 kg) Height:     BEHAVIORAL SYMPTOMS/MOOD NEUROLOGICAL BOWEL NUTRITION STATUS      Incontinent Diet (Please see discharge summary)  AMBULATORY STATUS COMMUNICATION OF NEEDS Skin  Verbally Other (Comment), PU Stage and Appropriate Care (Toe Anterior, Right, Left; Pressure Injury Coccyx Medial Stage 2)                       Personal Care Assistance Level of  Assistance  Bathing, Feeding, Dressing Bathing Assistance: Maximum assistance Feeding assistance: Limited assistance Dressing Assistance: Maximum assistance     Functional Limitations Info  Sight, Hearing, Speech Sight Info: Impaired (eyeglasses) Hearing Info: Impaired Speech Info: Adequate    SPECIAL CARE FACTORS FREQUENCY                       Contractures Contractures Info: Not present    Additional Factors Info  Code Status, Allergies, Insulin  Sliding Scale Code Status Info: Full Allergies Info: NKA   Insulin  Sliding Scale Info: Please see discharge summary       Current Medications (03/04/2024):  This is the current hospital active medication list Current Facility-Administered Medications  Medication Dose Route Frequency Provider Last Rate Last Admin   acetaminophen  (TYLENOL ) tablet 650 mg  650 mg Oral Q4H PRN Patel, Ekta V, MD   650 mg at 03/04/24 9476   Or   acetaminophen  (TYLENOL ) 160 MG/5ML solution 650 mg  650 mg Per Tube Q4H PRN Patel, Ekta V, MD       Or   acetaminophen  (TYLENOL ) suppository 650 mg  650 mg Rectal Q4H PRN Patel, Ekta V, MD       amLODipine  (NORVASC ) tablet 10 mg  10 mg Oral Daily Tobie Modest V, MD   10 mg at 03/04/24 0857   aspirin  EC tablet 81 mg  81 mg Oral Daily Patel, Ekta V, MD   81 mg at 03/04/24 0857   atorvastatin  (LIPITOR ) tablet 80 mg  80 mg Oral Daily Patel, Ekta V, MD   80 mg at 03/04/24 0857   baclofen  (LIORESAL ) tablet 5 mg  5 mg Oral TID Singh, Prashant K, MD   5 mg at 03/04/24 0857   Chlorhexidine  Gluconate Cloth 2 % PADS 6 each  6 each Topical Daily Singh, Prashant K, MD   6 each at 03/04/24 0911   ezetimibe  (ZETIA ) tablet 10 mg  10 mg Oral Daily Patel, Ekta V, MD   10 mg at 03/04/24 0857   feeding supplement (ENSURE PLUS HIGH PROTEIN) liquid 237 mL  237 mL Oral BID BM Patel, Ekta V, MD   237 mL at 03/04/24 0911   FLUoxetine  (PROZAC ) capsule 10 mg  10 mg Oral Daily Singh, Prashant K, MD   10 mg at 03/04/24 0931   heparin   injection 5,000 Units  5,000 Units Subcutaneous Q12H Tobie Modest GAILS, MD   5,000 Units at 03/04/24 0858   hydrALAZINE  (APRESOLINE ) tablet 50 mg  50 mg Oral Q8H Singh, Prashant K, MD   50 mg at 03/04/24 0523   insulin  aspart (novoLOG ) injection 0-15 Units  0-15 Units Subcutaneous TID WC Singh, Prashant K, MD   3 Units at 03/04/24 9094   insulin  aspart (novoLOG ) injection 0-5 Units  0-5 Units Subcutaneous QHS Singh, Prashant K, MD       insulin  glargine-yfgn (SEMGLEE ) injection 20 Units  20 Units Subcutaneous Daily Singh, Prashant K, MD   20 Units at 03/04/24 0911   lactated ringers  infusion   Intravenous Continuous Singh, Prashant K, MD 100 mL/hr at 03/04/24 0901 New Bag at 03/04/24 0901   melatonin tablet 3 mg  3 mg Oral QHS PRN Howerter, Justin B,  DO   3 mg at 03/02/24 2343   methimazole (TAPAZOLE) tablet 5 mg  5 mg Oral QHS Singh, Prashant K, MD   5 mg at 03/03/24 2126   pantoprazole  (PROTONIX ) EC tablet 40 mg  40 mg Oral Daily Patel, Ekta V, MD   40 mg at 03/04/24 0857   tamsulosin (FLOMAX) capsule 0.4 mg  0.4 mg Oral Daily Singh, Prashant K, MD   0.4 mg at 03/04/24 9141     Discharge Medications: Please see discharge summary for a list of discharge medications.  Relevant Imaging Results:  Relevant Lab Results:   Additional Information [id number]  Nikolaj Geraghty C Greysen Swanton, LCSWA

## 2024-03-04 NOTE — Progress Notes (Signed)
   03/04/24 0954  Mobility  Activity Mechanically lifted from bed to chair  Level of Assistance Total care (+2)  Assistive Device MaxiMove  Activity Response Tolerated fair  Mobility Referral Yes  Mobility visit 1 Mobility  Mobility Specialist Start Time (ACUTE ONLY) 0954  Mobility Specialist Stop Time (ACUTE ONLY) 1004  Mobility Specialist Time Calculation (min) (ACUTE ONLY) 10 min   Mobility Specialist: Progress Note  Pt agreeable to mobility session - received in bed. Pt was asymptomatic throughout session with no complaints. Returned to chair with all needs met - call bell within reach.  Left with RN present.   Additional comments:  Virgle Boards, BS Mobility Specialist Please contact via SecureChat or  Rehab office at [phone].

## 2024-03-04 NOTE — Progress Notes (Addendum)
 Nutrition Follow-up  DOCUMENTATION CODES:   Not applicable  INTERVENTION:  - Discontinue Ensure Plus High Protein po BID, each supplement provides 350 kcal and 20 grams of protein  -Add Glucerna Shake po BID, each supplement provides 220 kcal and 10 grams of protein  - MVI with minerals Add Magic cup TID with meals, each supplement provides 290 kcal and 9 grams of protein  -Collect new weight to establish trend  NUTRITION DIAGNOSIS:  Increased nutrient needs related to acute illness as evidenced by other (comment).  GOAL:  Patient will meet greater than or equal to 90% of their needs  MONITOR:  PO intake, Supplement acceptance, Labs  REASON FOR ASSESSMENT:  Consult Assessment of nutrition requirement/status  ASSESSMENT:   [name] with PMH significant for: T2DM, sepsis 2/2 UTI, paresis and dysarthria, HTN, depression, aspiration PNA, Non-Hodgkin's lymphoma, HTN, Guillain-Barre, HLD, neuropathy. Presents with reported visual hallucinations. Admitted for acute metabolic encephalopathy and with some dehydration as reports having 3 teeth extracted in the days leading up to admission with poor intake.  She is noted with bladder outlet obstruction and AKI. Remains on Flomax and with Foley in place with IVFs ordered. CT showed enlarged right thyroid  lobe. Per MD note, mentation improving.   Average Meal Intake 11/1: 0-100% x2 documented meals 11/2: 25-75% x3 documented meals 11/3: 100% x1 documented meal  Met with patient at bedside who is up resting in chair. Unsure of her baseline cognition status, so unsure of how reliable a historian she is. She does endorse having three teeth pulled and had not been eating well PTA. Reports no current pain or difficulties chewing or swallowing. Ate 100% of breakfast, per documented intake. Breakfast meal consisted of scrambled eggs, grits, blueberry muffin and low fat chocolate milk.  States she was dehydrated and that she has Foley catheter in place.  Shares she does not always like the food at her facility. They especially do not know how to prepare grits to her liking. States her sister will bring in food from outside the facility, at times. Does state she receives three meals per day and that she tries to eat right, to manage her hypertension and diabetes due to her hx of multiple CVAs.   Admit/Current Weight: 92 kg - needs new weight, as this does not look like her baseline weight  No new weight since admission. Will request knew one to assess trend as current documented weight is much heavier than any weight over the last year and a half. Could be fluid retention. No significant edema on exam. Per chart review, has shown 44lbs (27%) weight gain in last five months. Questionable trend in the setting of euvolemic status. Bowels stable. Stage 2 to coccyx.   Drains/Lines: Foley catheter UOP: 450 ml x24 hours  Insulin  regimen adjustments ongoing. Received potassium replacement for hypokalemia. BUN/Crt trending up. Lactated ringers  ordered.  Meds: SS Novolog , Semglee , pantoprazole   Labs:  Na+ 135 (wdl) K+ 3.4 > 4.2 (wdl) BUN 26>35>43 (H) Crt 8.86>8.70>8.13 (H) CBGs 221-241 x24 hours A1c 7.6 (03/2024)    NUTRITION - FOCUSED PHYSICAL EXAM:  While muscle and fat depletions do not, themself, indicated malnutrition, some non-severe malnutrition may be present given poor oral intake leading up to admission. Suspect the time frame of poor oral intake was likely not long enough to meet criteria for malnutrition in the context of acute illness.   Flowsheet Row Most Recent Value  Orbital Region No depletion  Upper Arm Region No depletion  Thoracic and Lumbar  Region No depletion  Buccal Region No depletion  Temple Region Mild depletion  Clavicle Bone Region No depletion  Clavicle and Acromion Bone Region Mild depletion  Scapular Bone Region No depletion  Dorsal Hand No depletion  Patellar Region No depletion  Anterior Thigh Region Mild  depletion  Posterior Calf Region Mild depletion  Edema (RD Assessment) None  Hair Reviewed  Eyes Reviewed  Mouth Reviewed  Skin Reviewed  Nails Reviewed    Diet Order:   Diet Order             Diet heart healthy/carb modified Room service appropriate? Yes; Fluid consistency: Thin  Diet effective now             EDUCATION NEEDS:   No education needs have been identified at this time  Skin:  Skin Assessment: Skin Integrity Issues: Skin Integrity Issues:: Stage II Stage II: coccyx  Last BM:  11/3 - type 6 x1  Height:  Ht Readings from Last 1 Encounters:  10/10/23 5' 4 (1.626 m)   Weight:  Wt Readings from Last 1 Encounters:  03/01/24 92 kg   Ideal Body Weight:  54.5 kg  BMI:  Body mass index is 34.81 kg/m.  Estimated Nutritional Needs:   Kcal:  1600-1800 kcals  Protein:  65-80g  Fluid:  1.6-1.8L/day  Blair Deaner MS, RD, LDN Registered Dietitian Clinical Nutrition RD Inpatient Contact Info in Amion

## 2024-03-04 NOTE — Progress Notes (Signed)
   03/04/24 1104  Spiritual Encounters  Type of Visit Initial  Care provided to: Patient  Referral source Clinical staff  Reason for visit Routine spiritual support  OnCall Visit No  Spiritual Framework  Presenting Themes Impactful experiences and emotions  Patient Stress Factors Health changes;Loss of control  Interventions  Spiritual Care Interventions Made Compassionate presence;Established relationship of care and support;Normalization of emotions;Prayer  Intervention Outcomes  Outcomes Awareness of support;Awareness of health;Reduced anxiety;Reduced fear   Chaplain met with the Pt who shared she been experiencing difficulty sleeping and report hearing voices. The PT requested prayer. Chaplain provided emotional and spiritual support and offered a word of prayer per the Pt's request. The Pt expressed gratitude for the visit.

## 2024-03-05 ENCOUNTER — Inpatient Hospital Stay (HOSPITAL_COMMUNITY)

## 2024-03-05 DIAGNOSIS — R410 Disorientation, unspecified: Secondary | ICD-10-CM | POA: Diagnosis not present

## 2024-03-05 LAB — OSMOLALITY, URINE: Osmolality, Ur: 275 mosm/kg — ABNORMAL LOW (ref 300–900)

## 2024-03-05 LAB — GLUCOSE, CAPILLARY
Glucose-Capillary: 215 mg/dL — ABNORMAL HIGH (ref 70–99)
Glucose-Capillary: 331 mg/dL — ABNORMAL HIGH (ref 70–99)
Glucose-Capillary: 353 mg/dL — ABNORMAL HIGH (ref 70–99)
Glucose-Capillary: 274 mg/dL — ABNORMAL HIGH (ref 70–99)
Glucose-Capillary: 225 mg/dL — ABNORMAL HIGH (ref 70–99)
Glucose-Capillary: 294 mg/dL — ABNORMAL HIGH (ref 70–99)
Glucose-Capillary: 227 mg/dL — ABNORMAL HIGH (ref 70–99)
Glucose-Capillary: 162 mg/dL — ABNORMAL HIGH (ref 70–99)

## 2024-03-05 LAB — BASIC METABOLIC PANEL WITH GFR
Anion gap: 9 (ref 5–15)
BUN: 51 mg/dL — ABNORMAL HIGH (ref 8–23)
CO2: 24 mmol/L (ref 22–32)
Calcium: 9.5 mg/dL (ref 8.9–10.3)
Chloride: 101 mmol/L (ref 98–111)
Creatinine, Ser: 1.89 mg/dL — ABNORMAL HIGH (ref 0.44–1.00)
GFR, Estimated: 29 mL/min — ABNORMAL LOW (ref >60)
Glucose, Bld: 232 mg/dL — ABNORMAL HIGH (ref 70–99)
Potassium: 4.3 mmol/L (ref 3.5–5.1)
Sodium: 134 mmol/L — ABNORMAL LOW (ref 135–145)

## 2024-03-05 LAB — CBC WITH DIFFERENTIAL/PLATELET
Abs Immature Granulocytes: 0.07 K/uL (ref 0.00–0.07)
Basophils Absolute: 0 K/uL (ref 0.0–0.1)
Basophils Relative: 0 %
Eosinophils Absolute: 0.1 K/uL (ref 0.0–0.5)
Eosinophils Relative: 2 %
HCT: 30.2 % — ABNORMAL LOW (ref 36.0–46.0)
Hemoglobin: 10 g/dL — ABNORMAL LOW (ref 12.0–15.0)
Immature Granulocytes: 1 %
Lymphocytes Relative: 19 %
Lymphs Abs: 0.9 K/uL (ref 0.7–4.0)
MCH: 27.1 pg (ref 26.0–34.0)
MCHC: 33.1 g/dL (ref 30.0–36.0)
MCV: 81.8 fL (ref 80.0–100.0)
Monocytes Absolute: 0.7 K/uL (ref 0.1–1.0)
Monocytes Relative: 14 %
Neutro Abs: 3.2 K/uL (ref 1.7–7.7)
Neutrophils Relative %: 64 %
Platelets: 181 K/uL (ref 150–400)
RBC: 3.69 MIL/uL — ABNORMAL LOW (ref 3.87–5.11)
RDW: 14.6 % (ref 11.5–15.5)
WBC: 5 K/uL (ref 4.0–10.5)
nRBC: 0 % (ref 0.0–0.2)

## 2024-03-05 LAB — MAGNESIUM: Magnesium: 2.1 mg/dL (ref 1.7–2.4)

## 2024-03-05 LAB — SODIUM, URINE, RANDOM: Sodium, Ur: 46 mmol/L

## 2024-03-05 LAB — CREATININE, URINE, RANDOM: Creatinine, Urine: 28 mg/dL

## 2024-03-05 MED ORDER — INSULIN ASPART 100 UNIT/ML IJ SOLN
3.0000 [IU] | Freq: Three times a day (TID) | INTRAMUSCULAR | Status: DC
  Administered 2024-03-05 – 2024-03-06 (×3): 3 [IU] via SUBCUTANEOUS
  Filled 2024-03-05 (×2): qty 3

## 2024-03-05 MED ORDER — LACTATED RINGERS IV SOLN: INTRAVENOUS | Status: DC

## 2024-03-05 NOTE — Inpatient Diabetes Management (Signed)
 Inpatient Diabetes Program Recommendations  AACE/ADA: New Consensus Statement on Inpatient Glycemic Control (2015)  Target Ranges:  Prepandial:   less than 140 mg/dL      Peak postprandial:   less than 180 mg/dL (1-2 hours)      Critically ill patients:  140 - 180 mg/dL   Lab Results  Component Value Date   GLUCAP 215 (H) 03/05/2024   HGBA1C 7.6 (H) 03/04/2024    Latest Reference Range & Units 03/04/24 07:57 03/04/24 12:01 03/04/24 17:01 03/04/24 20:59 03/05/24 08:12  Glucose-Capillary 70 - 99 mg/dL 814 (H) 743 (H) 764 (H) 270 (H) 215 (H)  (H): Data is abnormally high  Diabetes history: DM2 Outpatient Diabetes medications: Semglee  20, 0-10 tid meal coverage  Current orders for Inpatient glycemic control: Semglee  20 units daily, Novolog  0-15 units tid, 0-5 hs correction  Inpatient Diabetes Program Recommendations:   Postprandial CBGs >180. Please consider: -Add Novolog  3-4 units tid meal coverage if eats 50%  Thank you, Eric Nees E. Vernis Eid, RN, MSN, CNS, CDCES  Diabetes Coordinator Inpatient Glycemic Control Team Team Pager [phone] (8am-5pm) 03/05/2024 10:53 AM

## 2024-03-05 NOTE — Progress Notes (Signed)
 Occupational Therapy Treatment Patient Details Name: [name] MRN: [id number] DOB: October 13, 1960 Today's Date: 03/05/2024   History of present illness Patient is a 63 yo female presenting to the ED with AMS and hallucinations on 03/01/24. Patient had 3 teeth pulled on 10/30. Chest X-ray clear, CT head clear, MRI clear. Admitted with AMS. 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   OT comments  Patient seen this date and OT assisted with RN and NT to recliner with hoyer.  Patient appears back to baseline for cognition and self feeding/grooming, no further OT needs in the acute setting given assist as needed at SNF for LTC.  Recommend back to LTC when cleared by MD.        If plan is discharge home, recommend the following:  Two people to help with walking and/or transfers;Two people to help with bathing/dressing/bathroom;Assistance with cooking/housework;Assistance with feeding;Direct supervision/assist for medications management;Direct supervision/assist for financial management;Assist for transportation;Help with stairs or ramp for entrance;Supervision due to cognitive status   Equipment Recommendations  None recommended by OT    Recommendations for Other Services      Precautions / Restrictions Precautions Precautions: None Recall of Precautions/Restrictions: Impaired Restrictions Weight Bearing Restrictions Per Provider Order: No       Mobility Bed Mobility     Rolling: Total assist, +2 for physical assistance              Transfers Overall transfer level: Needs assistance                   Transfer via Lift Equipment: Maximove   Balance                                           ADL either performed or assessed with clinical judgement   ADL Overall ADL's : At baseline                                            Extremity/Trunk  Assessment Upper Extremity Assessment RUE Deficits / Details: weakness and effortful movement, but able to complete basic ROM movements RUE Sensation: WNL RUE Coordination: decreased gross motor LUE Deficits / Details: previous L hemiparesis LUE Coordination: decreased fine motor;decreased gross motor   Lower Extremity Assessment Lower Extremity Assessment: Defer to PT evaluation        Vision Patient Visual Report: No change from baseline     Perception Perception Perception: Not tested   Praxis Praxis Praxis: Not tested   Communication Communication Communication: Impaired Factors Affecting Communication: Reduced clarity of speech   Cognition Arousal: Alert Behavior During Therapy: WFL for tasks assessed/performed Cognition: History of cognitive impairments                               Following commands: Impaired Following commands impaired: Follows one step commands with increased time      Cueing   Cueing Techniques: Verbal cues, Gestural cues  Exercises      Shoulder Instructions       General Comments      Pertinent Vitals/ Pain       Pain Assessment Pain Assessment: No/denies pain  Frequency           Progress Toward Goals  OT Goals(current goals can now be found in the care plan section)  Progress towards OT goals: Goals met/education completed, patient discharged from OT  Acute Rehab OT Goals OT Goal Formulation: With patient Time For Goal Achievement: 03/16/24 Potential to Achieve Goals: Fair  Plan      Co-evaluation                 AM-PAC OT 6 Clicks Daily Activity     Outcome Measure   Help from another person eating meals?: A Little Help from another person taking care of personal grooming?: A Little Help from another person toileting, which includes using toliet, bedpan, or urinal?: Total Help from another person bathing  (including washing, rinsing, drying)?: Total Help from another person to put on and taking off regular upper body clothing?: Total Help from another person to put on and taking off regular lower body clothing?: Total 6 Click Score: 10    End of Session    OT Visit Diagnosis: Muscle weakness (generalized) (M62.81)   Activity Tolerance Patient tolerated treatment well   Patient Left in chair;with call bell/phone within reach   Nurse Communication Need for lift equipment        Time: 1010-1026 OT Time Calculation (min): 16 min  Charges: OT General Charges $OT Visit: 1 Visit OT Treatments $Therapeutic Activity: 8-22 mins  03/05/2024  RP, OTR/L  Acute Rehabilitation Services  Office:  [phone]   Charlie JONETTA Halsted 03/05/2024, 10:29 AM

## 2024-03-05 NOTE — Progress Notes (Signed)
 03/05/2024 4:26 PM -----------------------------------------------------------[facility]--------------------------------------------------- D(Data) A(Action) R(response)     Data: Noted Renal U/S ordered this am, needing completion.    Action: Phone call to [facility]. EPIC Secure Chat message to primary RN.    Response: Verbally confirmed ultrasound was planning on picking up patient shortly for renal u/s. Primary RN updated on plan. Ultrasound should be completed this evening.     Oluwadara Gorman, RN [employer]

## 2024-03-05 NOTE — Progress Notes (Signed)
 Speech Language Pathology Treatment: Cognitive-Linguistic  Patient Details Name: [name] MRN: [id number] DOB: 10-02-1960 Today's Date: 03/05/2024 Time: 8594-8564 SLP Time Calculation (min) (ACUTE ONLY): 30 min  Assessment / Plan / Recommendation Clinical Impression  Pt seen for skilled ST intervention targeting goals for attention, memory, and orientation. Pt oriented to person, place, and time without cues. Good recall of lunch tray items and attention to detail regarding her sandwich and fruit plate. Pt verbalizing change in vision and that things were moving slow - RN notified. Following RN visit to room, pt able to return to conversation regarding lunch. Intelligibility continues to be reduced due to baseline dysarthria. Recommend continued ST intervention focused on current plan of care. No family present to discuss baseline level of function.   HPI HPI: In with AMS and hallucinations.  PMH:  T2DM, HTN, HLD, stroke with residual left sided weakness/hemiparesis and dysarthria, sepsis from UTI, depression, proctocolitis, aspiration pneumonia, non hodgkins lymphoma, Guillain-Barre syndrome (1988, recovered), neuropathy.  Patient known to ST service with MBS completed 09/25/2022 with findings of oropharyngeal dysphagia and diet recommendation of mechanical soft with mildly thick liquids.  MRI of the brain was showing no acute intracranial abnormality.   Chest xray was showing stable check with no acute process noted.      SLP Plan  Continue with current plan of care          Recommendations   Continue per POC, Recommend Skilled ST at discharge        Oral care BID   Frequent or constant Supervision/Assistance Cognitive communication deficit (R41.841)     Continue with current plan of care    Mete Purdum B. Dory, MSP, CCC-SLP Speech Language Pathologist Office: [phone]  Dory Caprice Daring 03/05/2024, 2:41 PM

## 2024-03-05 NOTE — Progress Notes (Signed)
 PROGRESS NOTE                                                                                                                                                                                                             Patient Demographics:    [name], is a 63 y.o. 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  Per report patient had 3 teeth extracted last week on Thursday and since then has been noted to have disorientation confusion.  Also reports cough.  And per report patient has been behaving not her usual baseline.  Patient also reported to have visual hallucinations.  Spoke to ms brenda and states that she was hollering and h/o strokes and has not been eating or drinking and was refusing her meds.   Subjective:   Patient in bed, appears comfortable, denies any headache, no fever, no chest pain or pressure, no shortness of breath , no abdominal pain. No focal weakness.   Assessment  & Plan :   Acute metabolic encephalopathy with some hallucinations and disorientation which happened after she had 3 teeth pulled about 3 days ago. So far no clear source except delirium from anesthesia that she received, MRI brain nonacute, no headache or focal deficits, nonacute EEG, negative CT maxillofacial and neck without any  infectious source, minimize narcotics and benzodiazepines, continue supportive care.  PT, mentation improving with supportive care now, continue to expose to sunlight, advance activity and monitor.  She does have urinary retention on exam could be causing her encephalopathy.  Mentation now much better with supportive care she is close to her baseline.    Urinary retention.  Recurrent.  Flomax and Foley.  UA nonspecific but not impressive.  History of lymphoma and chronic anemia.  Continue on home medications  venetoclax   Dehydration, bladder outlet obstruction and AKI.  Already on Foley Flomax, hydrate and monitor.  Would have some element of IV contrast induced nephropathy also, holding diuretic and ARB.  Check urine electrolytes and renal ultrasound.  Hypertension.  On Norvasc  and hydralazine .  Hold ARB due to AKI.  HX of stroke.  No acute issues brain MRI negative, continue aspirin  and statin for secondary prevention.  History of hyperthyroidism.  Stable TSH, continue methimazole.  CT showed an enlarged right thyroid  lobe, will defer to outpatient endocrinologist and patient's PCP.  History of GERD.  PPI.  DM type II.  Lantus  and sliding scale.  Lab Results  Component Value Date   HGBA1C 7.6 (H) 03/04/2024   CBG (last 3)  Recent Labs    03/04/24 1701 03/04/24 2059 03/05/24 0812  GLUCAP 235* 270* 215*   Lab Results  Component Value Date   TSH 3.338 03/01/2024         Condition -  Guarded  Family Communication  :  sister [name] [phone]  on 03/02/24, 03/03/2024, 03/04/2024  Code Status : Full code  Consults  : None  PUD Prophylaxis : PPI   Procedures  :     MRI brain nonacute.  CT soft tissue neck and maxillofacial. 1. No acute findings. 2. Heterogeneous and enlarged right thyroid  lobe; recommend non-emergent thyroid  ultrasound.      Disposition Plan  :    Status is: Observation   DVT Prophylaxis  :    heparin  injection 5,000 Units Start: 03/01/24  2200   Lab Results  Component Value Date   PLT 181 03/05/2024    Diet :  Diet Order             Diet heart healthy/carb modified Room service appropriate? Yes; Fluid consistency: Thin  Diet effective now                    Inpatient Medications  Scheduled Meds:  amLODipine   10 mg Oral Daily   aspirin  EC  81 mg Oral Daily   atorvastatin   80 mg Oral Daily   baclofen   5 mg Oral TID   Chlorhexidine  Gluconate Cloth  6 each Topical Daily   ezetimibe   10 mg Oral Daily   feeding supplement (GLUCERNA SHAKE)  237 mL Oral BID BM   FLUoxetine   10 mg Oral Daily   heparin   5,000 Units Subcutaneous Q12H   hydrALAZINE   50 mg Oral Q8H   insulin  aspart  0-15 Units Subcutaneous TID WC   insulin  aspart  0-5 Units Subcutaneous QHS   insulin  glargine-yfgn  20 Units Subcutaneous Daily   methimazole  5 mg Oral QHS   multivitamin with minerals  1 tablet Oral Daily   pantoprazole   40 mg Oral Daily   tamsulosin  0.4 mg Oral Daily   Continuous Infusions:  lactated ringers  75 mL/hr at 03/05/24 0609   PRN Meds:.acetaminophen  **OR** acetaminophen  (TYLENOL ) oral liquid 160 mg/5 mL **OR** acetaminophen , melatonin  Antibiotics  :    Anti-infectives (From admission, onward)    None         Objective:   Vitals:   03/05/24 0500 03/05/24 0606 03/05/24 0610 03/05/24 0800  BP:  139/82 139/82 (!) 146/78  Pulse:  92  96  Resp:  20  (!) 23  Temp:      TempSrc:      SpO2:  100%  99%  Weight: 103.9 kg       Wt Readings from  Last 3 Encounters:  03/05/24 103.9 kg  10/10/23 72.6 kg  07/26/23 71.7 kg     Intake/Output Summary (Last 24 hours) at 03/05/2024 0918 Last data filed at 03/05/2024 0819 Gross per 24 hour  Intake 1650.67 ml  Output 1375 ml  Net 275.67 ml     Physical Exam  Awake, much more alert, no new F.N deficits, Foley catheter in place [place].AT,PERRAL Supple Neck, No JVD,   Symmetrical Chest wall movement, Good air movement bilaterally, CTAB RRR,No Gallops,Rubs or  new Murmurs,  +ve B.Sounds, Abd Soft, No tenderness,   No Cyanosis, Clubbing or edema     RN pressure injury documentation: Wound 03/01/24 2030 Pressure Injury Coccyx Medial Stage 2 -  Partial thickness loss of dermis presenting as a shallow open injury with a red, pink wound bed without slough. (Active)      Data Review:    Recent Labs  Lab 03/02/24 0422 03/02/24 9366 03/03/24 0434 03/04/24 0459 03/05/24 0414  WBC 5.5 5.8 5.9 5.5 5.0  HGB 12.4 12.8 10.6* 10.1* 10.0*  HCT 38.3 37.7 31.5* 30.4* 30.2*  PLT 238 254 201 197 181  MCV 83.4 81.8 81.2 81.7 81.8  MCH 27.0 27.8 27.3 27.2 27.1  MCHC 32.4 34.0 33.7 33.2 33.1  RDW 14.7 14.5 14.5 14.6 14.6  LYMPHSABS 1.4 1.6 1.8 1.1 0.9  MONOABS 0.8 0.7 0.8 0.7 0.7  EOSABS 0.1 0.1 0.2 0.1 0.1  BASOSABS 0.0 0.0 0.0 0.0 0.0    Recent Labs  Lab 03/01/24 1505 03/01/24 1519 03/01/24 2345 03/02/24 0422 03/02/24 0630 03/02/24 0633 03/03/24 0434 03/04/24 0459 03/04/24 0534 03/05/24 0414  NA 140   < >  --  141  --  140 136 135  --  134*  K 4.4   < >  --  3.7  --  3.7 3.4* 4.2  --  4.3  CL 104  --   --  104  --  101 101 101  --  101  CO2 20*  --   --  22  --  24 25 25   --  24  ANIONGAP 16*  --   --  15  --  15 10 9   --  9  GLUCOSE 157*  --   --  138*  --  137* 221* 241*  --  232*  BUN 25*  --   --  26*  --  26* 35* 43*  --  51*  CREATININE 1.18*  --   --  1.06*  --  1.13* 1.29* 1.86*  --  1.89*  AST 24  --   --  24  --   --   --   --   --   --   ALT 22  --   --  25  --   --   --   --   --   --   ALKPHOS 69  --   --  68  --   --   --   --   --   --   BILITOT 1.5*  --   --  1.1  --   --   --   --   --   --   ALBUMIN 3.2*  --   --  3.2*  --   --   --   --   --   --   CRP  --   --   --   --  0.6  --  <0.5 0.5  --   --  PROCALCITON  --   --   --   --  1.80  --  2.49 2.65  --   --   TSH  --   --  3.338  --   --   --   --   --   --   --   HGBA1C  --   --   --   --   --   --   --   --  7.6*  --   AMMONIA 23  --   --   --   --  14  --    --   --   --   MG  --    < > 1.8 1.8  --  1.9 1.8 1.9  --  2.1  PHOS  --   --   --  2.8  --   --   --   --   --   --   CALCIUM  10.0  --   --  10.3  --  10.4* 9.3 9.3  --  9.5   < > = values in this interval not displayed.      Recent Labs  Lab 03/01/24 1505 03/01/24 1505 03/01/24 2345 03/02/24 0422 03/02/24 0630 03/02/24 9366 03/03/24 0434 03/04/24 0459 03/04/24 0534 03/05/24 0414  CRP  --   --   --   --  0.6  --  <0.5 0.5  --   --   PROCALCITON  --   --   --   --  1.80  --  2.49 2.65  --   --   TSH  --   --  3.338  --   --   --   --   --   --   --   HGBA1C  --   --   --   --   --   --   --   --  7.6*  --   AMMONIA 23  --   --   --   --  14  --   --   --   --   MG  --    < > 1.8 1.8  --  1.9 1.8 1.9  --  2.1  CALCIUM  10.0  --   --  10.3  --  10.4* 9.3 9.3  --  9.5   < > = values in this interval not displayed.    --------------------------------------------------------------------------------------------------------------- Lab Results  Component Value Date   CHOL 132 03/02/2024   HDL 44 03/02/2024   LDLCALC 69 03/02/2024   LDLDIRECT 175.9 03/02/2012   TRIG 95 03/02/2024   CHOLHDL 3.0 03/02/2024    Lab Results  Component Value Date   HGBA1C 7.6 (H) 03/04/2024   No results for input(s): TSH, T4TOTAL, FREET4, T3FREE, THYROIDAB in the last 72 hours.  No results for input(s): VITAMINB12, FOLATE, FERRITIN, TIBC, IRON, RETICCTPCT in the last 72 hours.  ------------------------------------------------------------------------------------------------------------------ Cardiac Enzymes No results for input(s): CKMB, TROPONINI, MYOGLOBIN in the last 168 hours.  Invalid input(s): CK  Micro Results Recent Results (from the past 240 hours)  Resp panel by RT-PCR (RSV, Flu A&B, Covid) Anterior Nasal Swab     Status: None   Collection Time: 03/01/24  4:44 PM   Specimen: Anterior Nasal Swab  Result Value Ref Range Status   SARS Coronavirus 2 by  RT PCR NEGATIVE NEGATIVE Final   Influenza A by PCR NEGATIVE NEGATIVE Final   Influenza B by PCR NEGATIVE  NEGATIVE Final    Comment: (NOTE) The Xpert Xpress SARS-CoV-2/FLU/RSV plus assay is intended as an aid in the diagnosis of influenza from Nasopharyngeal swab specimens and should not be used as a sole basis for treatment. Nasal washings and aspirates are unacceptable for Xpert Xpress SARS-CoV-2/FLU/RSV testing.  Fact Sheet for Patients: bloggercourse.com  Fact Sheet for Healthcare Providers: seriousbroker.it  This test is not yet approved or cleared by the [place]  FDA and has been authorized for detection and/or diagnosis of SARS-CoV-2 by FDA under an Emergency Use Authorization (EUA). This EUA will remain in effect (meaning this test can be used) for the duration of the COVID-19 declaration under Section 564(b)(1) of the Act, 21 U.S.C. section 360bbb-3(b)(1), unless the authorization is terminated or revoked.     Resp Syncytial Virus by PCR NEGATIVE NEGATIVE Final    Comment: (NOTE) Fact Sheet for Patients: bloggercourse.com  Fact Sheet for Healthcare Providers: seriousbroker.it  This test is not yet approved or cleared by the [place]  FDA and has been authorized for detection and/or diagnosis of SARS-CoV-2 by FDA under an Emergency Use Authorization (EUA). This EUA will remain in effect (meaning this test can be used) for the duration of the COVID-19 declaration under Section 564(b)(1) of the Act, 21 U.S.C. section 360bbb-3(b)(1), unless the authorization is terminated or revoked.  Performed at [facility], [address], [place], [place] [address]     Radiology Report No results found.    Signature  -   Lavada Stank M.D on 03/05/2024 at 9:18 AM   -  To page go to [email or web address]

## 2024-03-06 ENCOUNTER — Other Ambulatory Visit: Payer: Self-pay

## 2024-03-06 DIAGNOSIS — R410 Disorientation, unspecified: Secondary | ICD-10-CM | POA: Diagnosis not present

## 2024-03-06 LAB — BASIC METABOLIC PANEL WITH GFR
Anion gap: 10 (ref 5–15)
BUN: 47 mg/dL — ABNORMAL HIGH (ref 8–23)
CO2: 24 mmol/L (ref 22–32)
Calcium: 9.4 mg/dL (ref 8.9–10.3)
Chloride: 102 mmol/L (ref 98–111)
Creatinine, Ser: 1.82 mg/dL — ABNORMAL HIGH (ref 0.44–1.00)
GFR, Estimated: 31 mL/min — ABNORMAL LOW (ref >60)
Glucose, Bld: 153 mg/dL — ABNORMAL HIGH (ref 70–99)
Potassium: 4.3 mmol/L (ref 3.5–5.1)
Sodium: 136 mmol/L (ref 135–145)

## 2024-03-06 LAB — CBC WITH DIFFERENTIAL/PLATELET
Abs Immature Granulocytes: 0.07 K/uL (ref 0.00–0.07)
Basophils Absolute: 0 K/uL (ref 0.0–0.1)
Basophils Relative: 0 %
Eosinophils Absolute: 0.1 K/uL (ref 0.0–0.5)
Eosinophils Relative: 2 %
HCT: 25.8 % — ABNORMAL LOW (ref 36.0–46.0)
Hemoglobin: 8.5 g/dL — ABNORMAL LOW (ref 12.0–15.0)
Immature Granulocytes: 1 %
Lymphocytes Relative: 18 %
Lymphs Abs: 1 K/uL (ref 0.7–4.0)
MCH: 27.1 pg (ref 26.0–34.0)
MCHC: 32.9 g/dL (ref 30.0–36.0)
MCV: 82.2 fL (ref 80.0–100.0)
Monocytes Absolute: 0.7 K/uL (ref 0.1–1.0)
Monocytes Relative: 14 %
Neutro Abs: 3.5 K/uL (ref 1.7–7.7)
Neutrophils Relative %: 65 %
Platelets: 159 K/uL (ref 150–400)
RBC: 3.14 MIL/uL — ABNORMAL LOW (ref 3.87–5.11)
RDW: 14.9 % (ref 11.5–15.5)
WBC: 5.4 K/uL (ref 4.0–10.5)
nRBC: 0 % (ref 0.0–0.2)

## 2024-03-06 LAB — GLUCOSE, CAPILLARY
Glucose-Capillary: 192 mg/dL — ABNORMAL HIGH (ref 70–99)
Glucose-Capillary: 184 mg/dL — ABNORMAL HIGH (ref 70–99)

## 2024-03-06 MED ORDER — INSULIN ASPART 100 UNIT/ML IJ SOLN
0.0000 [IU] | Freq: Three times a day (TID) | INTRAMUSCULAR | Status: AC
Start: 2024-03-06 — End: ?

## 2024-03-06 MED ORDER — TAMSULOSIN HCL 0.4 MG PO CAPS: 0.4000 mg | ORAL_CAPSULE | Freq: Every day | ORAL | Status: DC

## 2024-03-06 MED ORDER — HYDROCHLOROTHIAZIDE 12.5 MG PO TABS: 12.5000 mg | ORAL_TABLET | Freq: Every day | ORAL | Status: DC

## 2024-03-06 MED ORDER — BACLOFEN 5 MG PO TABS: 20.0000 mg | ORAL_TABLET | Freq: Three times a day (TID) | ORAL | Status: AC | PRN

## 2024-03-06 NOTE — TOC Progression Note (Signed)
 Transition of Care [facility]) - Progression Note    Patient Details  Name: [name] MRN: [id number] Date of Birth: [birth year]  Transition of Care [facility]) CM/SW Contact  Inocente GORMAN Kindle, LCSW Phone Number: 03/06/2024, 9:17 AM  Clinical Narrative:    [name] is ready to accept patient. CSW updated patient's sister, [name], who reported agreement with [facility] for transport.    Expected Discharge Plan: Skilled Nursing Facility Barriers to Discharge: Barriers Resolved               Expected Discharge Plan and Services In-house Referral: Clinical Social Work Discharge Planning Services: CM Consult Post Acute Care Choice: Nursing Home Living arrangements for the past 2 months: Skilled Nursing Facility Expected Discharge Date: 03/06/24                                     Social Drivers of Health (SDOH) Interventions SDOH Screenings   Food Insecurity: No Food Insecurity (03/01/2024)  Housing: Low Risk  (03/01/2024)  Transportation Needs: No Transportation Needs (03/01/2024)  Utilities: Not At Risk (03/01/2024)  Depression (PHQ2-9): Low Risk  (03/16/2022)  Tobacco Use: Low Risk  (03/01/2024)    Readmission Risk Interventions     No data to display

## 2024-03-06 NOTE — TOC Transition Note (Signed)
 Transition of Care [facility]) - Discharge Note   Patient Details  Name: [name] MRN: [id number] Date of Birth: Jul 23, 1960  Transition of Care [facility]) CM/SW Contact:  Inocente GORMAN Kindle, LCSW Phone Number: 03/06/2024, 11:03 AM   Clinical Narrative:    Patient will DC to: [place] [facility] Anticipated DC date: 03/06/24 Family notified: Sister, [name] Transport by: [facility]   Per MD patient ready for DC to [facility]. RN to call report prior to discharge [phone], room [address]). RN, patient, patient's family, and facility notified of DC. Discharge Summary and FL2 sent to facility. DC packet on chart. Ambulance transport requested for patient.   CSW will sign off for now as social work intervention is no longer needed. Please consult us  again if new needs arise.     Final next level of care: Skilled Nursing Facility Barriers to Discharge: Barriers Resolved   Patient Goals and CMS Choice Patient states their goals for this hospitalization and ongoing recovery are:: return to SNF CMS Medicare.gov Compare Post Acute Care list provided to:: Other (Comment Required) Choice offered to / list presented to : Sibling      Discharge Placement   Existing PASRR number confirmed : 03/06/24          Patient chooses bed at: [facility] Patient to be transferred to facility by: [facility] Name of family member notified: Sister Patient and family notified of of transfer: 03/06/24  Discharge Plan and Services Additional resources added to the After Visit Summary for   In-house Referral: Clinical Social Work Discharge Planning Services: CM Consult Post Acute Care Choice: Nursing Home                               Social Drivers of Health (SDOH) Interventions SDOH Screenings   Food Insecurity: No Food Insecurity (03/01/2024)  Housing: Low Risk  (03/01/2024)  Transportation Needs: No Transportation Needs (03/01/2024)  Utilities: Not At Risk (03/01/2024)  Depression (PHQ2-9): Low Risk   (03/16/2022)  Tobacco Use: Low Risk  (03/01/2024)     Readmission Risk Interventions     No data to display

## 2024-03-06 NOTE — Discharge Instructions (Addendum)
 Follow with Primary MD [name], DO and your endocrinologist in 7 days, follow-up with the recommended urologist in 7 to 10 days if you still have a Foley catheter.  Get CBC, CMP, Magnesium , 2 view Chest X ray -  checked next visit with your primary MD or SNF MD    Activity: As tolerated with Full fall precautions use walker/cane & assistance as needed  Disposition SNF  Diet: Heart Healthy low carbohydrate diet, check CBGs q. ACHS.  Special Instructions: If you have smoked or chewed Tobacco  in the last 2 yrs please stop smoking, stop any regular Alcohol  and or any Recreational drug use.  On your next visit with your primary care physician please Get Medicines reviewed and adjusted.  Please request your Prim.MD to go over all Hospital Tests and Procedure/Radiological results at the follow up, please get all Hospital records sent to your Prim MD by signing hospital release before you go home.  If you experience worsening of your admission symptoms, develop shortness of breath, life threatening emergency, suicidal or homicidal thoughts you must seek medical attention immediately by calling 911 or calling your MD immediately  if symptoms less severe.  You Must read complete instructions/literature along with all the possible adverse reactions/side effects for all the Medicines you take and that have been prescribed to you. Take any new Medicines after you have completely understood and accpet all the possible adverse reactions/side effects.   Do not drive when taking Pain medications.  Do not take more than prescribed Pain, Sleep and Anxiety Medications  Wear Seat belts while driving.

## 2024-03-06 NOTE — Care Management Important Message (Signed)
 Important Message  Patient Details  Name: [name] MRN: [id number] Date of Birth: November 21, 1960   Important Message Given:  Yes - Medicare IM Patient left prior to the IM delivery will mail a copy to the patient home address.    Jacquette Canales 03/06/2024, 3:04 PM

## 2024-03-06 NOTE — Discharge Summary (Addendum)
 [name] FMW:[id number] DOB: 10/01/1960 DOA: 03/01/2024  PCP: [name], DO  Admit date: 03/01/2024  Discharge date: 03/06/2024  Admitted From: SNF   Disposition:  SNF   Recommendations for Outpatient Follow-up:   Follow up with PCP in 1-2 weeks  PCP Please obtain BMP/CBC, 2 view CXR in 1week,  (see Discharge instructions)   PCP Please follow up on the following pending results: Check CBC, BMP, TSH, free T4 and magnesium  in 3 to 4 days, arrange for outpatient endocrine and urology follow-up.   Home Health: None   Equipment/Devices: None  Consultations: None  Discharge Condition: Stable    CODE STATUS: Full    Diet Recommendation: Heart Healthy Low Carb, check CBGs q. ACHS    Chief Complaint  Patient presents with   Altered Mental Status   Hallucinations     Brief history of present illness from the day of admission and additional interim summary    63 y.o. 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  Per report patient had 3 teeth extracted last week on Thursday and since then has been noted to have disorientation confusion.  Also reports cough.  And per report patient has been behaving not her usual baseline.  Patient also reported to have visual hallucinations.   Spoke to [name] and states that she was hollering and h/o strokes and has not been eating or drinking and was refusing her  meds.                                                                 Mata Course   Acute metabolic encephalopathy with some hallucinations and disorientation which happened after she had 3 teeth pulled about 3 days ago. So far no clear source except delirium from anesthesia that she received, MRI brain nonacute, no headache or focal deficits, nonacute EEG, negative CT maxillofacial and neck without any infectious source, minimize narcotics and benzodiazepines, continue supportive care.  PT, mentation improving with supportive care now, continue to expose to sunlight, advance activity and monitor.  She does have urinary retention on exam could be causing her encephalopathy.  Mentation now much better with supportive care she is close to her baseline.  Be discharged back to SNF, minimize benzodiazepines and narcotics, lowered baclofen  dose.  Patient does have episodes of mild delirium from time to time I am wondering if this is longstanding and she has some underlying cognitive deficits or developing early dementia, will benefit from outpatient neurology follow-up in 1 to 2 weeks  postdischarge   Urinary retention.  Recurrent.  Flomax and Foley, will be discharged with both with outpatient urology follow-up.   History of lymphoma and chronic anemia.  Continue on home medications venetoclax    Dehydration, bladder outlet obstruction and AKI.  Already on Foley Flomax, hydrate and monitor.  Would have some element of IV contrast induced nephropathy also, holding diuretic and ARB.  Function has stabilized, good urine output, will be discharged with Foley catheter in Flomax, renal ultrasound showed multiple bilateral kidney cysts, recommend outpatient urology and nephrology follow-up in 7 to 10 days to be arranged by SNF MD/PCP.  Check BMP in 7 to 10 days.   Hypertension.  On Norvasc  and hydralazine .  Hold ARB due to AKI.  Skipping HCTZ for few more days.   HX of stroke.  No acute issues brain MRI  negative, continue aspirin  and statin for secondary prevention.   History of hyperthyroidism.  Stable TSH, continue methimazole.  CT showed an enlarged right thyroid  lobe, will defer to outpatient endocrinologist and patient's PCP.   History of GERD.  PPI.   DM type II.  Lantus  and sliding scale Tineo, currently continue to check CBGs q. ACHS at SNF and adjust as needed.    Discharge diagnosis     Principal Problem:   Disorientation    Discharge instructions    Discharge Instructions     Discharge instructions   Complete by: As directed    Follow with Primary MD [name], DO and your endocrinologist in 7 days, follow-up with the recommended urologist in 7 to 10 days if you still have a Foley catheter.  Get CBC, CMP, Magnesium , 2 view Chest X ray -  checked next visit with your primary MD or SNF MD    Activity: As tolerated with Full fall precautions use walker/cane & assistance as needed  Disposition SNF  Diet: Heart Healthy low carbohydrate diet, check CBGs q. ACHS.  Special Instructions: If you have smoked or chewed Tobacco  in the last 2 yrs please stop smoking, stop any regular Alcohol  and or any Recreational drug use.  On your next visit with your primary care physician please Get Medicines reviewed and adjusted.  Please request your Prim.MD to go over all Mata Tests and Procedure/Radiological results at the follow up, please get all Mata records sent to your Prim MD by signing Mata release before you go home.  If you experience worsening of your admission symptoms, develop shortness of breath, life threatening emergency, suicidal or homicidal thoughts you must seek medical attention immediately by calling 911 or calling your MD immediately  if symptoms less severe.  You Must read complete instructions/literature along with all the possible adverse reactions/side effects for all the Medicines you take and that have been prescribed to you. Take any  new Medicines after you have completely understood and accpet all the possible adverse reactions/side effects.   Do not drive when taking Pain medications.  Do not take more than prescribed Pain, Sleep and Anxiety Medications  Wear Seat belts while driving.   Increase activity slowly   Complete by: As directed    No wound care   Complete by: As directed        Discharge Medications   Allergies as of 03/06/2024   No Known Allergies      Medication List     STOP taking these medications    lidocaine  5 % Commonly known as: Lidoderm    losartan  25 MG tablet Commonly known  as: COZAAR    traMADol  HCl 25 MG Tabs       TAKE these medications    acetaminophen  325 MG tablet Commonly known as: TYLENOL  Take 650 mg by mouth every 4 (four) hours as needed for mild pain (pain score 1-3) or fever. Do not exceed 3000mg  in 24 hours   amLODipine  10 MG tablet Commonly known as: NORVASC  Take 1 tablet by mouth once daily   antiseptic oral rinse Liqd 15 mLs by Mouth Rinse route 3 (three) times daily.   aspirin  EC 81 MG tablet Take 1 tablet (81 mg total) by mouth daily. Swallow whole.   atorvastatin  80 MG tablet Commonly known as: Lipitor  Take 1 tablet (80 mg total) by mouth daily.   Baclofen  5 MG Tabs Take 4 tablets (20 mg total) by mouth 3 (three) times daily as needed for muscle spasms. What changed:  medication strength when to take this reasons to take this   Biofreeze Cool The Pain 4 % Gel Generic drug: Menthol  (Topical Analgesic) Apply 1 Application topically every 8 (eight) hours as needed.   busPIRone 15 MG tablet Commonly known as: BUSPAR Take 15 mg by mouth 3 (three) times daily.   cloNIDine 0.1 mg/24hr patch Commonly known as: CATAPRES - Dosed in mg/24 hr Place 0.1 mg onto the skin every Saturday.   ezetimibe  10 MG tablet Commonly known as: ZETIA  Take 1 tablet (10 mg total) by mouth daily.   famotidine  20 MG tablet Commonly known as: PEPCID  Take 20 mg  by mouth at bedtime.   FLUoxetine  40 MG capsule Commonly known as: PROZAC  Take 40 mg by mouth daily.   guaiFENesin  100 MG/5ML liquid Commonly known as: ROBITUSSIN Take 10 mLs by mouth every 4 (four) hours as needed for cough or to loosen phlegm.   hydrochlorothiazide  12.5 MG tablet Commonly known as: HYDRODIURIL  Take 1 tablet (12.5 mg total) by mouth daily. Start taking on: March 09, 2024 What changed: These instructions start on March 09, 2024. If you are unsure what to do until then, ask your doctor or other care provider.   hydrocortisone  cream 1 % Apply 1 Application topically 2 (two) times daily.   insulin  aspart 100 UNIT/ML injection Commonly known as: novoLOG  Inject 0-10 Units into the skin 3 (three) times daily before meals. Before each meal 3 times a day, 140-199 - 4 units, 200-250 - 6 units, 251-299 - 8 units,  300-349 - 10 units,  350 or above 12 units. What changed: additional instructions   metFORMIN  500 MG tablet Commonly known as: GLUCOPHAGE  Take 500 mg by mouth 2 (two) times daily with a meal.   methimazole 5 MG tablet Commonly known as: TAPAZOLE Take 5 mg by mouth at bedtime.   ONE TOUCH LANCETS Misc USE TO CHECK BLOOD SUGAR TWICE A DAY AND PRN   Orajel 2X Toothache & Gum 20-0.26 % Gel Generic drug: Benzocaine-Menthol  Use as directed 1 Application in the mouth or throat every 4 (four) hours as needed.   OXYGEN Inhale 2 L/min into the lungs as needed.   pantoprazole  40 MG tablet Commonly known as: PROTONIX  Take 1 tablet (40 mg total) by mouth daily.   Semglee  (yfgn) 100 UNIT/ML injection Generic drug: insulin  glargine-yfgn Inject 20 Units into the skin daily.   senna-docusate 8.6-50 MG tablet Commonly known as: Senokot-S Take 1 tablet by mouth daily as needed for mild constipation.   tamsulosin 0.4 MG Caps capsule Commonly known as: FLOMAX Take 1 capsule (0.4 mg total) by mouth  daily.   venetoclax  100 MG tablet Commonly known as:  VENCLEXTA  Take 2 tablets (200 mg total) by mouth daily. Tablets should be swallowed whole with a meal and a full glass of water.         Follow-up Information     [name], DO. Schedule an appointment as soon as possible for a visit in 1 week(s).   Contact information: [address] [place] [address] [phone]         Alvaro Ricardo KATHEE Mickey., MD. Schedule an appointment as soon as possible for a visit in 1 week(s).   Specialty: Urology Contact information: [address] [place] [address] [phone]         Tobie Gordy POUR, MD. Schedule an appointment as soon as possible for a visit in 2 week(s).   Specialties: Nephrology, Vascular Surgery Contact information: [address] [place] [address] [phone]                 Major procedures and Radiology Reports - PLEASE review detailed and final reports thoroughly  -       US  RENAL Result Date: 03/05/2024 CLINICAL DATA:  Acute kidney injury. EXAM: RENAL / URINARY TRACT ULTRASOUND COMPLETE COMPARISON:  None Available. FINDINGS: Right Kidney: Renal measurements: 11.2 cm x 5.2 cm x 4.9 cm = volume: 148.86 mL. Echogenicity within normal limits. A 2.2 cm x 2.1 cm x 2.2 cm simple cyst is seen within the mid right kidney. No hydronephrosis is visualized. Left Kidney: Renal measurements: 10.1 cm x 4.6 cm x 4.0 cm = volume: 97.55 mL. Echogenicity within normal limits. A 2.8 cm x 2.0 cm x 2.8 cm simple cyst is seen within the lower pole of the left kidney. No hydronephrosis is visualized. Bladder: A Foley catheter is seen within the urinary bladder. Other: The study is technically difficult secondary to the patient's condition status post left-sided stroke, as per the ultrasound technologist. IMPRESSION: Large bilateral simple renal cysts. Electronically Signed   By: Suzen Dials M.D.   On: 03/05/2024 21:21   EEG adult Result Date: 03/02/2024 Shelton Arlin KIDD, MD     03/02/2024  2:12 PM Patient Name:  [name] MRN: [id number] Epilepsy Attending: Arlin KIDD Shelton Referring Physician/Provider: Dennise Lavada POUR, MD Date:  03/02/2024 Duration: 22.23 mins Patient history: 63yo f with ams. EEG to evaluate for seizure Level of alertness: Awake AEDs during EEG study: None Technical aspects: This EEG study was done with scalp electrodes positioned according to the 10-20 International system of electrode placement. Electrical activity was reviewed with band pass filter of 1-70Hz , sensitivity of 7 uV/mm, display speed of 26mm/sec with a 60Hz  notched filter applied as appropriate. EEG data were recorded continuously and digitally stored.  Video monitoring was available and reviewed as appropriate. Description: The posterior dominant rhythm consists of 8Hz  activity of moderate voltage (25-35 uV) seen predominantly in posterior head regions, symmetric and reactive to eye opening and eye closing. Hyperventilation and photic stimulation were not performed.   IMPRESSION: This study is within normal limits. No seizures or epileptiform discharges were seen throughout the recording. A normal interictal EEG does not exclude the diagnosis of epilepsy. Arlin KIDD Shelton   CT SOFT TISSUE NECK W CONTRAST Result Date: 03/02/2024 EXAM: CT NECK WITH CONTRAST 03/02/2024 09:42:00 AM TECHNIQUE: CT of the neck was performed with the administration of 75 mL of iohexol  (OMNIPAQUE ) 350 MG/ML injection. Multiplanar reformatted images are provided for review. Automated exposure control, iterative reconstruction, and/or weight  based adjustment of the mA/kV was utilized to reduce the radiation dose to as low as reasonably achievable. COMPARISON: [id number] CLINICAL HISTORY: Soft tissue infection suspected, neck, xray done; recent teeth extraction with facial and neck pain. FINDINGS: AERODIGESTIVE TRACT: No discrete mass. No edema. SALIVARY GLANDS: The parotid and submandibular glands are unremarkable. THYROID : Heterogeneous and enlarged right  thyroid  lobe in a 63 year old patient. LYMPH NODES: No suspicious cervical lymphadenopathy. SOFT TISSUES: No mass or fluid collection. BRAIN, ORBITS, SINUSES AND MASTOIDS: No acute abnormality. LUNGS AND MEDIASTINUM: No acute abnormality. BONES: Poor dentition with dental caries but no periapical abscess. No focal bone abnormality. VASCULATURE: Aberrant right subclavian artery. IMPRESSION: 1. No acute findings. 2. Heterogeneous and enlarged right thyroid  lobe; recommend non-emergent thyroid  ultrasound. Electronically signed by: Franky Stanford MD 03/02/2024 10:08 AM EDT RP Workstation: [id number]   MR BRAIN WO CONTRAST Result Date: 03/01/2024 CLINICAL DATA:  Initial evaluation for acute mental status change, unknown cause. EXAM: MRI HEAD WITHOUT CONTRAST TECHNIQUE: Multiplanar, multiecho pulse sequences of the brain and surrounding structures were obtained without intravenous contrast. COMPARISON:  Comparison made with CT performed earlier the same day as well as previous MRI from 01/05/2023. FINDINGS: Brain: Cerebral volume within normal limits. Patchy T2/FLAIR hyperintensity involving the periventricular and deep white matter both cerebral hemispheres, consistent with chronic small vessel ischemic disease, similar prior. Chronic encephalomalacia involving the ventral medulla again noted, stable. Few scattered small remote bilateral cerebellar infarcts, unchanged. No abnormal foci of restricted diffusion to suggest acute or subacute ischemia. No areas of interval cortical infarction. No acute intracranial hemorrhage. Few small chronic micro hemorrhages noted about the right cerebellum, right thalamus, and right occipital lobe, likely small vessel related. No mass lesion, midline shift or mass effect. No hydrocephalus or extra-axial fluid collection. Pituitary gland within normal limits. Vascular: Loss of normal flow voids within the right greater than left intradural V4 segments to the vertebrobasilar junction.  Preserved flow void distally within the basilar artery. Overall, appearance is relatively stable as compared to previous exam. Preserved normal flow voids within the anterior circulation. Skull and upper cervical spine: Craniocervical junction within normal limits. Bone marrow signal intensity overall within normal limits. No scalp soft tissue abnormality. Sinuses/Orbits: Globes orbital soft tissues within normal limits. Paranasal sinuses are largely clear. Trace left mastoid effusion noted, of doubtful significance. Other: None. IMPRESSION: 1. No acute intracranial abnormality. 2. Chronic encephalomalacia involving the ventral medulla, with a few scattered small remote bilateral cerebellar infarcts, stable. 3. Loss of normal flow voids within the right greater than left intradural V4 segments to the vertebrobasilar junction, stable from previous. 4. Underlying chronic microvascular ischemic disease, stable. Electronically Signed   By: Morene Hoard M.D.   On: 03/01/2024 20:27   DG Chest Portable 1 View Result Date: 03/01/2024 CLINICAL DATA:  Cough, recent dental procedure, hallucinations EXAM: PORTABLE CHEST 1 VIEW COMPARISON:  03/28/2023 FINDINGS: Single frontal view of the chest demonstrates a stable cardiac silhouette. Linear scarring at the left lung base unchanged. No airspace disease, effusion, or pneumothorax. No acute bony abnormalities. IMPRESSION: 1. Stable chest, no acute process. Electronically Signed   By: Ozell Daring M.D.   On: 03/01/2024 15:28   CT HEAD WO CONTRAST Result Date: 03/01/2024 EXAM: CT HEAD WITHOUT CONTRAST 03/01/2024 02:15:00 PM TECHNIQUE: CT of the head was performed without the administration of intravenous contrast. Automated exposure control, iterative reconstruction, and/or weight based adjustment of the mA/kV was utilized to reduce the radiation dose to as low as reasonably achievable.  COMPARISON: 01/05/2023 CLINICAL HISTORY: Mental status change, unknown cause.  FINDINGS: BRAIN AND VENTRICLES: No acute hemorrhage. No evidence of acute infarct. No hydrocephalus. No extra-axial collection. No mass effect or midline shift. Calcified atherosclerotic plaque within cavernous/supraclinoid internal carotid arteries. ORBITS: No acute abnormality. SINUSES: No acute abnormality. SOFT TISSUES AND SKULL: No acute soft tissue abnormality. No skull fracture. IMPRESSION: 1. No acute intracranial abnormality. 2. Intracranial internal carotid artery atherosclerosis with calcified plaque within the cavernous and supraclinoid segments. Electronically signed by: Franky Stanford MD 03/01/2024 03:02 PM EDT RP Workstation: [id number]   US  THYROID  Result Date: 02/10/2024 CLINICAL DATA:  Incidental on PET. EXAM: THYROID  ULTRASOUND TECHNIQUE: Ultrasound examination of the thyroid  gland and adjacent soft tissues was performed. COMPARISON:  10/19/2023 FINDINGS: Parenchymal Echotexture: Moderately heterogeneous Isthmus: 0.6 cm Right lobe: 5.0 x 2.7 x 2.4 cm Left lobe: 2.7 x 0.5 x 1.0 cm ________________________________________________________ Estimated total number of nodules >/= 1 cm: 4 Number of spongiform nodules >/=  2 cm not described below (TR1): 0 Number of mixed cystic and solid nodules >/= 1.5 cm not described below (TR2): 0 _________________________________________________________ Nodule # 1: Location: Isthmus; Mid Maximum size: 1.5 cm; Other 2 dimensions: 0.7 x 0.6 cm Composition: solid/almost completely solid (2) Echogenicity: isoechoic (1) Shape: not taller-than-wide (0) Margins: ill-defined (0) Echogenic foci: none (0) ACR TI-RADS total points: 3. ACR TI-RADS risk category: TR3 (3 points). ACR TI-RADS recommendations: *Given size (>/= 1.5 - 2.4 cm) and appearance, a follow-up ultrasound in 1 year should be considered based on TI-RADS criteria. _________________________________________________________ Nodule # 2: Location: Right; Superior Maximum size: 1.4 cm; Other 2 dimensions: 1.3 x 1.2  cm Composition: mixed cystic and solid (1) Echogenicity: isoechoic (1) Shape: not taller-than-wide (0) Margins: smooth (0) Echogenic foci: none (0) ACR TI-RADS total points: 2. ACR TI-RADS risk category: TR2 (2 points). ACR TI-RADS recommendations: This nodule does NOT meet TI-RADS criteria for biopsy or dedicated follow-up. _________________________________________________________ Nodule # 3: Location: Right; Inferior Maximum size: 2.7 cm; Other 2 dimensions: 2.3 x 2.1 cm Composition: solid/almost completely solid (2) Echogenicity: isoechoic (1) Shape: not taller-than-wide (0) Margins: smooth (0) Echogenic foci: none (0) ACR TI-RADS total points: 3. ACR TI-RADS risk category: TR3 (3 points). ACR TI-RADS recommendations: **Given size (>/= 2.5 cm) and appearance, fine needle aspiration of this mildly suspicious nodule should be considered based on TI-RADS criteria. _________________________________________________________ Nodule # 4: Location: Right; Inferior Maximum size: 1.5 cm; Other 2 dimensions: 1.1 x 0.9 cm Composition: cystic/almost completely cystic (0) Echogenicity: anechoic (0) Shape: not taller-than-wide (0) Margins: smooth (0) Echogenic foci: none (0) ACR TI-RADS total points: 0. ACR TI-RADS risk category: TR1 (0-1 points). ACR TI-RADS recommendations: This nodule does NOT meet TI-RADS criteria for biopsy or dedicated follow-up. _________________________________________________________ No cervical lymphadenopathy. IMPRESSION: 1. Multinodular thyroid . 2. Solid nodule in the right inferior thyroid  (labeled 3, 2.7 cm) meets criteria (TI-RADS category 3) for tissue sampling and corresponds to area of increased FDG uptake on comparison PET. Recommend ultrasound-guided fine-needle aspiration. 3. Solid nodule in the thyroid  isthmus (labeled 1, 1.5 cm) meets criteria (TI-RADS category 3) for 1 year ultrasound surveillance. The above is in keeping with the ACR TI-RADS recommendations - J Am Coll Radiol  2017;14:587-595. Ester Sides, MD [facility] Electronically Signed   By: Ester Sides M.D.   On: 02/10/2024 06:41    Micro Results    Recent Results (from the past 240 hours)  Resp panel by RT-PCR (RSV, Flu A&B, Covid) Anterior Nasal Swab  Status: None   Collection Time: 03/01/24  4:44 PM   Specimen: Anterior Nasal Swab  Result Value Ref Range Status   SARS Coronavirus 2 by RT PCR NEGATIVE NEGATIVE Final   Influenza A by PCR NEGATIVE NEGATIVE Final   Influenza B by PCR NEGATIVE NEGATIVE Final    Comment: (NOTE) The Xpert Xpress SARS-CoV-2/FLU/RSV plus assay is intended as an aid in the diagnosis of influenza from Nasopharyngeal swab specimens and should not be used as a sole basis for treatment. Nasal washings and aspirates are unacceptable for Xpert Xpress SARS-CoV-2/FLU/RSV testing.  Fact Sheet for Patients: bloggercourse.com  Fact Sheet for Healthcare Providers: seriousbroker.it  This test is not yet approved or cleared by the [place]  FDA and has been authorized for detection and/or diagnosis of SARS-CoV-2 by FDA under an Emergency Use Authorization (EUA). This EUA will remain in effect (meaning this test can be used) for the duration of the COVID-19 declaration under Section 564(b)(1) of the Act, 21 U.S.C. section 360bbb-3(b)(1), unless the authorization is terminated or revoked.     Resp Syncytial Virus by PCR NEGATIVE NEGATIVE Final    Comment: (NOTE) Fact Sheet for Patients: bloggercourse.com  Fact Sheet for Healthcare Providers: seriousbroker.it  This test is not yet approved or cleared by the [place]  FDA and has been authorized for detection and/or diagnosis of SARS-CoV-2 by FDA under an Emergency Use Authorization (EUA). This EUA will remain in effect (meaning this test can be used) for the  duration of the COVID-19 declaration under Section 564(b)(1) of the Act, 21 U.S.C. section 360bbb-3(b)(1), unless the authorization is terminated or revoked.  Performed at [facility], [address], [place], [place] [address]     Today   Subjective    Shanterica Biehler today has no headache,no chest abdominal pain,no new weakness tingling or numbness, feels much better      Objective   Blood pressure (!) 150/77, pulse 86, temperature 97.6 F (36.4 C), temperature source Oral, resp. rate 16, weight 103.9 kg, SpO2 98%.   Intake/Output Summary (Last 24 hours) at 03/06/2024 0731 Last data filed at 03/06/2024 0444 Gross per 24 hour  Intake 1381.6 ml  Output 3650 ml  Net -2268.4 ml    Exam  Awake Alert, No new F.N deficits,    [place].AT,PERRAL Supple Neck,   Symmetrical Chest wall movement, Good air movement bilaterally, CTAB RRR,No Gallops,   +ve B.Sounds, Abd Soft, Non tender,  No Cyanosis, Clubbing or edema    Data Review   Recent Labs  Lab 03/02/24 0633 03/03/24 0434 03/04/24 0459 03/05/24 0414 03/06/24 0245  WBC 5.8 5.9 5.5 5.0 5.4  HGB 12.8 10.6* 10.1* 10.0* 8.5*  HCT 37.7 31.5* 30.4* 30.2* 25.8*  PLT 254 201 197 181 159  MCV 81.8 81.2 81.7 81.8 82.2  MCH 27.8 27.3 27.2 27.1 27.1  MCHC 34.0 33.7 33.2 33.1 32.9  RDW 14.5 14.5 14.6 14.6 14.9  LYMPHSABS 1.6 1.8 1.1 0.9 1.0  MONOABS 0.7 0.8 0.7 0.7 0.7  EOSABS 0.1 0.2 0.1 0.1 0.1  BASOSABS 0.0 0.0 0.0 0.0 0.0    Recent Labs  Lab 03/01/24 1505 03/01/24 1519 03/01/24 2345 03/02/24 0422 03/02/24 0630 03/02/24 0633 03/03/24 0434 03/04/24 0459 03/04/24 0534 03/05/24 0414 03/06/24 0245  NA 140   < >  --  141  --  140 136 135  --  134* 136  K 4.4   < >  --  3.7  --  3.7 3.4* 4.2  --  4.3 4.3  CL 104  --   --  104  --  101 101 101  --  101 102  CO2 20*  --   --  22  --  24 25 25   --  24 24  ANIONGAP 16*  --   --  15  --  15 10 9   --  9 10  GLUCOSE 157*  --   --  138*  --  137* 221* 241*  --  232*  153*  BUN 25*  --   --  26*  --  26* 35* 43*  --  51* 47*  CREATININE 1.18*  --   --  1.06*  --  1.13* 1.29* 1.86*  --  1.89* 1.82*  AST 24  --   --  24  --   --   --   --   --   --   --   ALT 22  --   --  25  --   --   --   --   --   --   --   ALKPHOS 69  --   --  68  --   --   --   --   --   --   --   BILITOT 1.5*  --   --  1.1  --   --   --   --   --   --   --   ALBUMIN 3.2*  --   --  3.2*  --   --   --   --   --   --   --   CRP  --   --   --   --  0.6  --  <0.5 0.5  --   --   --   PROCALCITON  --   --   --   --  1.80  --  2.49 2.65  --   --   --   TSH  --   --  3.338  --   --   --   --   --   --   --   --   HGBA1C  --   --   --   --   --   --   --   --  7.6*  --   --   AMMONIA 23  --   --   --   --  14  --   --   --   --   --   MG  --    < > 1.8 1.8  --  1.9 1.8 1.9  --  2.1  --   PHOS  --   --   --  2.8  --   --   --   --   --   --   --   CALCIUM  10.0  --   --  10.3  --  10.4* 9.3 9.3  --  9.5 9.4   < > = values in this interval not displayed.    Total Time in preparing paper work, data evaluation and todays exam - 35 minutes  Signature  -    Lavada Stank M.D on 03/06/2024 at 7:31 AM   -  To page go to [email or web address]

## 2024-03-06 NOTE — Plan of Care (Signed)
  Problem: Coping: Goal: Ability to adjust to condition or change in health will improve Outcome: Progressing   Problem: Health Behavior/Discharge Planning: Goal: Ability to identify and utilize available resources and services will improve Outcome: Progressing   Problem: Education: Goal: Knowledge of disease or condition will improve Outcome: Progressing Goal: Knowledge of secondary prevention will improve (MUST DOCUMENT ALL) Outcome: Progressing Goal: Knowledge of patient specific risk factors will improve (DELETE if not current risk factor) Outcome: Progressing   Problem: Ischemic Stroke/TIA Tissue Perfusion: Goal: Complications of ischemic stroke/TIA will be minimized Outcome: Progressing   Problem: Coping: Goal: Will verbalize positive feelings about self Outcome: Progressing

## 2024-03-27 ENCOUNTER — Other Ambulatory Visit (HOSPITAL_COMMUNITY): Payer: Self-pay

## 2024-03-27 ENCOUNTER — Other Ambulatory Visit: Payer: Self-pay

## 2024-03-27 ENCOUNTER — Other Ambulatory Visit: Payer: Self-pay | Admitting: Hematology

## 2024-03-27 MED ORDER — VENETOCLAX 100 MG PO TABS
200.0000 mg | ORAL_TABLET | Freq: Every day | ORAL | 0 refills | Status: DC
  Filled 2024-03-27 – 2024-04-04 (×3): qty 56, 28d supply, fill #0

## 2024-04-03 ENCOUNTER — Other Ambulatory Visit: Payer: Self-pay

## 2024-04-03 ENCOUNTER — Other Ambulatory Visit (HOSPITAL_COMMUNITY): Payer: Self-pay

## 2024-04-03 NOTE — Progress Notes (Signed)
 Specialty Pharmacy Refill Coordination Note  [name] is a 63 y.o. female contacted today regarding refills of specialty medication(s) Venetoclax  (VENCLEXTA )   Patient requested Delivery   Delivery date: 04/05/24   Verified address: [address] ATTN: [name]   Medication will be filled on: 04/04/24

## 2024-04-04 ENCOUNTER — Other Ambulatory Visit: Payer: Self-pay

## 2024-04-23 ENCOUNTER — Other Ambulatory Visit: Payer: Self-pay | Admitting: Hematology

## 2024-04-23 ENCOUNTER — Other Ambulatory Visit: Payer: Self-pay

## 2024-04-23 MED ORDER — VENETOCLAX 100 MG PO TABS
200.0000 mg | ORAL_TABLET | Freq: Every day | ORAL | 0 refills | Status: DC
  Filled 2024-04-23 – 2024-04-24 (×2): qty 56, 28d supply, fill #0

## 2024-04-24 ENCOUNTER — Other Ambulatory Visit: Payer: Self-pay | Admitting: Pharmacy Technician

## 2024-04-24 ENCOUNTER — Other Ambulatory Visit: Payer: Self-pay

## 2024-04-24 NOTE — Progress Notes (Signed)
 Specialty Pharmacy Refill Coordination Note  [name] is a 63 y.o. female contacted today, spoke with nurse regarding refills of specialty medication(s) Venetoclax  (VENCLEXTA )   Patient requested Delivery   Delivery date: 04/30/24   Verified address: [address] ATTN: [name]   Medication will be filled on: 04/29/24

## 2024-04-29 ENCOUNTER — Inpatient Hospital Stay (HOSPITAL_COMMUNITY)
Admission: EM | Admit: 2024-04-29 | Discharge: 2024-05-03 | DRG: 689 | Disposition: A | Discharge status: Skilled Nursing Facility | Source: Skilled Nursing Facility | Attending: Internal Medicine | Admitting: Internal Medicine

## 2024-04-29 ENCOUNTER — Emergency Department (HOSPITAL_COMMUNITY)

## 2024-04-29 ENCOUNTER — Other Ambulatory Visit: Payer: Self-pay

## 2024-04-29 DIAGNOSIS — L89152 Pressure ulcer of sacral region, stage 2: Secondary | ICD-10-CM | POA: Diagnosis present

## 2024-04-29 DIAGNOSIS — G9341 Metabolic encephalopathy: Secondary | ICD-10-CM | POA: Diagnosis present

## 2024-04-29 DIAGNOSIS — Z833 Family history of diabetes mellitus: Secondary | ICD-10-CM

## 2024-04-29 DIAGNOSIS — I471 Supraventricular tachycardia, unspecified: Secondary | ICD-10-CM | POA: Diagnosis not present

## 2024-04-29 DIAGNOSIS — Z87440 Personal history of urinary (tract) infections: Secondary | ICD-10-CM

## 2024-04-29 DIAGNOSIS — Z823 Family history of stroke: Secondary | ICD-10-CM

## 2024-04-29 DIAGNOSIS — C8300 Small cell B-cell lymphoma, unspecified site: Secondary | ICD-10-CM | POA: Diagnosis present

## 2024-04-29 DIAGNOSIS — B9620 Unspecified Escherichia coli [E. coli] as the cause of diseases classified elsewhere: Secondary | ICD-10-CM | POA: Diagnosis present

## 2024-04-29 DIAGNOSIS — Z90710 Acquired absence of both cervix and uterus: Secondary | ICD-10-CM

## 2024-04-29 DIAGNOSIS — R5381 Other malaise: Secondary | ICD-10-CM | POA: Diagnosis present

## 2024-04-29 DIAGNOSIS — Z79899 Other long term (current) drug therapy: Secondary | ICD-10-CM

## 2024-04-29 DIAGNOSIS — Z7984 Long term (current) use of oral hypoglycemic drugs: Secondary | ICD-10-CM

## 2024-04-29 DIAGNOSIS — I69322 Dysarthria following cerebral infarction: Secondary | ICD-10-CM

## 2024-04-29 DIAGNOSIS — M79604 Pain in right leg: Secondary | ICD-10-CM | POA: Diagnosis present

## 2024-04-29 DIAGNOSIS — E1140 Type 2 diabetes mellitus with diabetic neuropathy, unspecified: Secondary | ICD-10-CM | POA: Diagnosis present

## 2024-04-29 DIAGNOSIS — Z794 Long term (current) use of insulin: Secondary | ICD-10-CM

## 2024-04-29 DIAGNOSIS — D849 Immunodeficiency, unspecified: Secondary | ICD-10-CM | POA: Diagnosis present

## 2024-04-29 DIAGNOSIS — E039 Hypothyroidism, unspecified: Secondary | ICD-10-CM | POA: Diagnosis present

## 2024-04-29 DIAGNOSIS — I10 Essential (primary) hypertension: Secondary | ICD-10-CM | POA: Diagnosis present

## 2024-04-29 DIAGNOSIS — I69354 Hemiplegia and hemiparesis following cerebral infarction affecting left non-dominant side: Secondary | ICD-10-CM

## 2024-04-29 DIAGNOSIS — E1169 Type 2 diabetes mellitus with other specified complication: Secondary | ICD-10-CM

## 2024-04-29 DIAGNOSIS — I160 Hypertensive urgency: Secondary | ICD-10-CM | POA: Diagnosis present

## 2024-04-29 DIAGNOSIS — E785 Hyperlipidemia, unspecified: Secondary | ICD-10-CM | POA: Diagnosis present

## 2024-04-29 DIAGNOSIS — L309 Dermatitis, unspecified: Secondary | ICD-10-CM | POA: Diagnosis present

## 2024-04-29 DIAGNOSIS — I69328 Other speech and language deficits following cerebral infarction: Secondary | ICD-10-CM

## 2024-04-29 DIAGNOSIS — M79605 Pain in left leg: Secondary | ICD-10-CM | POA: Diagnosis present

## 2024-04-29 DIAGNOSIS — Z8701 Personal history of pneumonia (recurrent): Secondary | ICD-10-CM

## 2024-04-29 DIAGNOSIS — F32A Depression, unspecified: Secondary | ICD-10-CM | POA: Diagnosis present

## 2024-04-29 DIAGNOSIS — C8590 Non-Hodgkin lymphoma, unspecified, unspecified site: Secondary | ICD-10-CM | POA: Diagnosis present

## 2024-04-29 DIAGNOSIS — K219 Gastro-esophageal reflux disease without esophagitis: Secondary | ICD-10-CM | POA: Diagnosis present

## 2024-04-29 DIAGNOSIS — N3000 Acute cystitis without hematuria: Principal | ICD-10-CM | POA: Diagnosis present

## 2024-04-29 DIAGNOSIS — Z8249 Family history of ischemic heart disease and other diseases of the circulatory system: Secondary | ICD-10-CM

## 2024-04-29 DIAGNOSIS — Z7982 Long term (current) use of aspirin: Secondary | ICD-10-CM

## 2024-04-29 DIAGNOSIS — Z8619 Personal history of other infectious and parasitic diseases: Secondary | ICD-10-CM

## 2024-04-29 LAB — CBC WITH DIFFERENTIAL/PLATELET
Abs Immature Granulocytes: 0.01 K/uL (ref 0.00–0.07)
Basophils Absolute: 0 K/uL (ref 0.0–0.1)
Basophils Relative: 0 %
Eosinophils Absolute: 0 K/uL (ref 0.0–0.5)
Eosinophils Relative: 0 %
HCT: 35.9 % — ABNORMAL LOW (ref 36.0–46.0)
Hemoglobin: 11.7 g/dL — ABNORMAL LOW (ref 12.0–15.0)
Immature Granulocytes: 0 %
Lymphocytes Relative: 13 %
Lymphs Abs: 0.8 K/uL (ref 0.7–4.0)
MCH: 27.1 pg (ref 26.0–34.0)
MCHC: 32.6 g/dL (ref 30.0–36.0)
MCV: 83.3 fL (ref 80.0–100.0)
Monocytes Absolute: 0.6 K/uL (ref 0.1–1.0)
Monocytes Relative: 10 %
Neutro Abs: 4.8 K/uL (ref 1.7–7.7)
Neutrophils Relative %: 77 %
Platelets: 254 K/uL (ref 150–400)
RBC: 4.31 MIL/uL (ref 3.87–5.11)
RDW: 15.4 % (ref 11.5–15.5)
WBC: 6.4 K/uL (ref 4.0–10.5)
nRBC: 0 % (ref 0.0–0.2)

## 2024-04-29 LAB — COMPREHENSIVE METABOLIC PANEL WITH GFR
ALT: 22 U/L (ref 0–44)
AST: 26 U/L (ref 15–41)
Albumin: 4 g/dL (ref 3.5–5.0)
Alkaline Phosphatase: 80 U/L (ref 38–126)
Anion gap: 13 (ref 5–15)
BUN: 25 mg/dL — ABNORMAL HIGH (ref 8–23)
CO2: 28 mmol/L (ref 22–32)
Calcium: 10.9 mg/dL — ABNORMAL HIGH (ref 8.9–10.3)
Chloride: 102 mmol/L (ref 98–111)
Creatinine, Ser: 0.82 mg/dL (ref 0.44–1.00)
GFR, Estimated: >60 mL/min
Glucose, Bld: 98 mg/dL (ref 70–99)
Potassium: 3.7 mmol/L (ref 3.5–5.1)
Sodium: 143 mmol/L (ref 135–145)
Total Bilirubin: 0.4 mg/dL (ref 0.0–1.2)
Total Protein: 7.9 g/dL (ref 6.5–8.1)

## 2024-04-29 LAB — I-STAT CHEM 8, ED
BUN: 25 mg/dL — ABNORMAL HIGH (ref 8–23)
Calcium, Ion: 1.32 mmol/L (ref 1.15–1.40)
Chloride: 102 mmol/L (ref 98–111)
Creatinine, Ser: 0.9 mg/dL (ref 0.44–1.00)
Glucose, Bld: 103 mg/dL — ABNORMAL HIGH (ref 70–99)
HCT: 35 % — ABNORMAL LOW (ref 36.0–46.0)
Hemoglobin: 11.9 g/dL — ABNORMAL LOW (ref 12.0–15.0)
Potassium: 3.6 mmol/L (ref 3.5–5.1)
Sodium: 144 mmol/L (ref 135–145)
TCO2: 28 mmol/L (ref 22–32)

## 2024-04-29 LAB — I-STAT CG4 LACTIC ACID, ED: Lactic Acid, Venous: 1.2 mmol/L (ref 0.5–1.9)

## 2024-04-29 MED ORDER — SODIUM CHLORIDE 0.9 % IV BOLUS
500.0000 mL | Freq: Once | INTRAVENOUS | Status: AC
  Administered 2024-04-29: 500 mL via INTRAVENOUS

## 2024-04-29 MED ORDER — QUETIAPINE FUMARATE 25 MG PO TABS
25.0000 mg | ORAL_TABLET | Freq: Once | ORAL | Status: AC
  Administered 2024-04-29: 25 mg via ORAL
  Filled 2024-04-29: qty 1

## 2024-04-29 NOTE — ED Notes (Signed)
 Patient transported to CT

## 2024-04-29 NOTE — ED Provider Triage Note (Signed)
 Emergency Medicine Provider Triage Evaluation Note  Saje Gallop , a 63 y.o. female  was evaluated in triage.  Pt complains of weakness, hypertension unable to get a clear history of why she is here.  She seems to be hallucinating and is complaining of pain from wounds on her legs although I do not see any wounds.  She seems to be complaining of abdominal pain.  She is noted to be tachycardic.SABRA  Review of Systems  Positive: Abdominal pain, leg pain Negative: Fever  Physical Exam  BP (!) 185/97   Pulse (!) 129   Temp 97.9 F (36.6 C)   Resp (!) 23   SpO2 99%  Gen:   Awake, no distress tachycardic, ill-appearing Resp:  Normal effort   MSK:   Moves extremities without difficulty   Other:  Diffuse abdominal pain  Medical Decision Making  Medically screening exam initiated at 6:39 PM.  Appropriate orders placed.  Dawsyn Zurn was informed that the remainder of the evaluation will be completed by another provider, this initial triage assessment does not replace that evaluation, and the importance of remaining in the ED until their evaluation is complete.  Started orders, trying to find bed for patient   Lenor Hollering, MD 04/29/24 1840

## 2024-04-29 NOTE — ED Notes (Signed)
 Pt stating something is eating her leg and is inside her shoulder. Dr. Guillermina prescribed seroquel , verified with pharmacy. Med given to patient

## 2024-04-29 NOTE — ED Triage Notes (Signed)
 63 yo pt bib [facility] with c/o back pain, hypertension. No recent hx of falls or trauma, hx of muscle spasms. Has an rx for baclofen . Stroke hx L sided deficits, hx UTIs  EMS vitals: 206/108, pressures same in both arms  128 97% RA Cbg 108 Temp 99.2

## 2024-04-30 ENCOUNTER — Emergency Department (HOSPITAL_COMMUNITY)

## 2024-04-30 ENCOUNTER — Observation Stay (HOSPITAL_COMMUNITY)

## 2024-04-30 DIAGNOSIS — G9341 Metabolic encephalopathy: Secondary | ICD-10-CM | POA: Diagnosis not present

## 2024-04-30 LAB — TYPE AND SCREEN
ABO/RH(D): O POS
Antibody Screen: NEGATIVE

## 2024-04-30 LAB — URINALYSIS, W/ REFLEX TO CULTURE (INFECTION SUSPECTED)
Bilirubin Urine: NEGATIVE
Glucose, UA: NEGATIVE mg/dL
Ketones, ur: NEGATIVE mg/dL
Nitrite: NEGATIVE
Protein, ur: >=300 mg/dL — AB
Specific Gravity, Urine: 1.009 (ref 1.005–1.030)
pH: 7 (ref 5.0–8.0)

## 2024-04-30 LAB — PRO BRAIN NATRIURETIC PEPTIDE: Pro Brain Natriuretic Peptide: 1432 pg/mL — ABNORMAL HIGH

## 2024-04-30 LAB — I-STAT CG4 LACTIC ACID, ED
Lactic Acid, Venous: 1 mmol/L (ref 0.5–1.9)
Lactic Acid, Venous: 1.1 mmol/L (ref 0.5–1.9)
Lactic Acid, Venous: 1.3 mmol/L (ref 0.5–1.9)

## 2024-04-30 LAB — CBC WITH DIFFERENTIAL/PLATELET
Abs Immature Granulocytes: 0.02 K/uL (ref 0.00–0.07)
Basophils Absolute: 0 K/uL (ref 0.0–0.1)
Basophils Relative: 0 %
Eosinophils Absolute: 0.1 K/uL (ref 0.0–0.5)
Eosinophils Relative: 1 %
HCT: 32.3 % — ABNORMAL LOW (ref 36.0–46.0)
Hemoglobin: 10.7 g/dL — ABNORMAL LOW (ref 12.0–15.0)
Immature Granulocytes: 1 %
Lymphocytes Relative: 22 %
Lymphs Abs: 1 K/uL (ref 0.7–4.0)
MCH: 27.4 pg (ref 26.0–34.0)
MCHC: 33.1 g/dL (ref 30.0–36.0)
MCV: 82.6 fL (ref 80.0–100.0)
Monocytes Absolute: 0.7 K/uL (ref 0.1–1.0)
Monocytes Relative: 15 %
Neutro Abs: 2.7 K/uL (ref 1.7–7.7)
Neutrophils Relative %: 61 %
Platelets: 215 K/uL (ref 150–400)
RBC: 3.91 MIL/uL (ref 3.87–5.11)
RDW: 15.8 % — ABNORMAL HIGH (ref 11.5–15.5)
WBC: 4.4 K/uL (ref 4.0–10.5)
nRBC: 0 % (ref 0.0–0.2)

## 2024-04-30 LAB — COMPREHENSIVE METABOLIC PANEL WITH GFR
ALT: 20 U/L (ref 0–44)
AST: 23 U/L (ref 15–41)
Albumin: 3.5 g/dL (ref 3.5–5.0)
Alkaline Phosphatase: 71 U/L (ref 38–126)
Anion gap: 9 (ref 5–15)
BUN: 22 mg/dL (ref 8–23)
CO2: 27 mmol/L (ref 22–32)
Calcium: 10 mg/dL (ref 8.9–10.3)
Chloride: 108 mmol/L (ref 98–111)
Creatinine, Ser: 0.74 mg/dL (ref 0.44–1.00)
GFR, Estimated: >60 mL/min
Glucose, Bld: 95 mg/dL (ref 70–99)
Potassium: 3.3 mmol/L — ABNORMAL LOW (ref 3.5–5.1)
Sodium: 144 mmol/L (ref 135–145)
Total Bilirubin: 0.3 mg/dL (ref 0.0–1.2)
Total Protein: 6.9 g/dL (ref 6.5–8.1)

## 2024-04-30 LAB — IRON AND TIBC
Iron: 44 ug/dL (ref 28–170)
Saturation Ratios: 15 % (ref 10.4–31.8)
TIBC: 287 ug/dL (ref 250–450)
UIBC: 243 ug/dL

## 2024-04-30 LAB — GLUCOSE, CAPILLARY: Glucose-Capillary: 89 mg/dL (ref 70–99)

## 2024-04-30 LAB — RETICULOCYTES
Immature Retic Fract: 17.3 % — ABNORMAL HIGH (ref 2.3–15.9)
RBC.: 3.93 MIL/uL (ref 3.87–5.11)
Retic Count, Absolute: 67.2 K/uL (ref 19.0–186.0)
Retic Ct Pct: 1.7 % (ref 0.4–3.1)

## 2024-04-30 LAB — TROPONIN T, HIGH SENSITIVITY
Troponin T High Sensitivity: 131 ng/L (ref 0–19)
Troponin T High Sensitivity: 124 ng/L (ref 0–19)
Troponin T High Sensitivity: 147 ng/L (ref 0–19)
Troponin T High Sensitivity: 131 ng/L (ref 0–19)

## 2024-04-30 LAB — VITAMIN B12: Vitamin B-12: 1694 pg/mL — ABNORMAL HIGH (ref 180–914)

## 2024-04-30 LAB — CBG MONITORING, ED
Glucose-Capillary: 87 mg/dL (ref 70–99)
Glucose-Capillary: 103 mg/dL — ABNORMAL HIGH (ref 70–99)
Glucose-Capillary: 114 mg/dL — ABNORMAL HIGH (ref 70–99)

## 2024-04-30 LAB — TSH
TSH: 0.588 u[IU]/mL (ref 0.350–4.500)
TSH: 0.498 u[IU]/mL (ref 0.350–4.500)

## 2024-04-30 LAB — MAGNESIUM: Magnesium: 1.7 mg/dL (ref 1.7–2.4)

## 2024-04-30 LAB — PROCALCITONIN: Procalcitonin: 0.57 ng/mL

## 2024-04-30 LAB — FERRITIN: Ferritin: 421 ng/mL — ABNORMAL HIGH (ref 11–307)

## 2024-04-30 LAB — PHOSPHORUS: Phosphorus: 2.7 mg/dL (ref 2.5–4.6)

## 2024-04-30 LAB — T4, FREE: Free T4: 1.46 ng/dL (ref 0.80–2.00)

## 2024-04-30 LAB — CK: Total CK: 96 U/L (ref 38–234)

## 2024-04-30 LAB — FOLATE: Folate: >20 ng/mL

## 2024-04-30 MED ORDER — SODIUM CHLORIDE 0.9 % IV SOLN
1.0000 g | Freq: Once | INTRAVENOUS | Status: AC
  Administered 2024-04-30: 1 g via INTRAVENOUS
  Filled 2024-04-30: qty 10

## 2024-04-30 MED ORDER — PANTOPRAZOLE SODIUM 40 MG IV SOLR
40.0000 mg | Freq: Two times a day (BID) | INTRAVENOUS | Status: DC
  Administered 2024-04-30 – 2024-05-03 (×6): 40 mg via INTRAVENOUS
  Filled 2024-04-30 (×6): qty 10

## 2024-04-30 MED ORDER — HEPARIN SODIUM (PORCINE) 5000 UNIT/ML IJ SOLN
5000.0000 [IU] | Freq: Three times a day (TID) | INTRAMUSCULAR | Status: DC
  Administered 2024-04-30 – 2024-05-03 (×8): 5000 [IU] via SUBCUTANEOUS
  Filled 2024-04-30 (×8): qty 1

## 2024-04-30 MED ORDER — ACETAMINOPHEN 325 MG PO TABS
650.0000 mg | ORAL_TABLET | Freq: Four times a day (QID) | ORAL | Status: DC | PRN
  Administered 2024-04-30 – 2024-05-02 (×3): 650 mg via ORAL
  Filled 2024-04-30 (×3): qty 2

## 2024-04-30 MED ORDER — CLONIDINE HCL 0.1 MG/24HR TD PTWK: 0.1000 mg | MEDICATED_PATCH | TRANSDERMAL | Status: DC

## 2024-04-30 MED ORDER — STROKE: EARLY STAGES OF RECOVERY BOOK
Freq: Once | Status: AC
  Filled 2024-04-30: qty 1

## 2024-04-30 MED ORDER — LORAZEPAM 2 MG/ML IJ SOLN
1.0000 mg | Freq: Once | INTRAMUSCULAR | Status: AC | PRN
  Administered 2024-04-30: 1 mg via INTRAVENOUS
  Filled 2024-04-30: qty 1

## 2024-04-30 MED ORDER — FENTANYL CITRATE (PF) 50 MCG/ML IJ SOSY
50.0000 ug | PREFILLED_SYRINGE | Freq: Once | INTRAMUSCULAR | Status: AC
  Administered 2024-04-30: 50 ug via INTRAVENOUS
  Filled 2024-04-30: qty 1

## 2024-04-30 MED ORDER — EZETIMIBE 10 MG PO TABS
10.0000 mg | ORAL_TABLET | Freq: Every day | ORAL | Status: DC
  Administered 2024-04-30 – 2024-05-03 (×4): 10 mg via ORAL
  Filled 2024-04-30 (×4): qty 1

## 2024-04-30 MED ORDER — MORPHINE SULFATE (PF) 2 MG/ML IV SOLN
2.0000 mg | INTRAVENOUS | Status: DC | PRN
  Administered 2024-04-30 (×2): 2 mg via INTRAVENOUS
  Filled 2024-04-30 (×2): qty 1

## 2024-04-30 MED ORDER — HYDROCHLOROTHIAZIDE 12.5 MG PO TABS
12.5000 mg | ORAL_TABLET | Freq: Every day | ORAL | Status: DC
  Administered 2024-04-30 – 2024-05-03 (×4): 12.5 mg via ORAL
  Filled 2024-04-30 (×4): qty 1

## 2024-04-30 MED ORDER — FLUOXETINE HCL 20 MG PO CAPS
40.0000 mg | ORAL_CAPSULE | Freq: Every day | ORAL | Status: DC
  Administered 2024-04-30 – 2024-05-03 (×4): 40 mg via ORAL
  Filled 2024-04-30 (×4): qty 2

## 2024-04-30 MED ORDER — VENETOCLAX 100 MG PO TABS: 200.0000 mg | ORAL_TABLET | Freq: Every day | ORAL | Status: DC

## 2024-04-30 MED ORDER — BUSPIRONE HCL 5 MG PO TABS
10.0000 mg | ORAL_TABLET | Freq: Two times a day (BID) | ORAL | Status: DC
  Administered 2024-04-30 – 2024-05-03 (×6): 10 mg via ORAL
  Filled 2024-04-30 (×6): qty 2

## 2024-04-30 MED ORDER — LACTATED RINGERS IV SOLN: INTRAVENOUS | Status: AC

## 2024-04-30 MED ORDER — HYDROMORPHONE HCL 1 MG/ML IJ SOLN
0.5000 mg | INTRAMUSCULAR | Status: DC | PRN
  Administered 2024-04-30 – 2024-05-02 (×6): 0.5 mg via INTRAVENOUS
  Filled 2024-04-30 (×7): qty 0.5

## 2024-04-30 MED ORDER — ONDANSETRON HCL 4 MG/2ML IJ SOLN: 4.0000 mg | Freq: Four times a day (QID) | INTRAMUSCULAR | Status: DC | PRN

## 2024-04-30 MED ORDER — AMLODIPINE BESYLATE 10 MG PO TABS
10.0000 mg | ORAL_TABLET | Freq: Every day | ORAL | Status: DC
  Administered 2024-05-01 – 2024-05-03 (×3): 10 mg via ORAL
  Filled 2024-04-30 (×3): qty 1

## 2024-04-30 MED ORDER — IOHEXOL 350 MG/ML SOLN
75.0000 mL | Freq: Once | INTRAVENOUS | Status: AC | PRN
  Administered 2024-04-30: 75 mL via INTRAVENOUS

## 2024-04-30 MED ORDER — ACETAMINOPHEN 650 MG RE SUPP: 650.0000 mg | Freq: Four times a day (QID) | RECTAL | Status: DC | PRN

## 2024-04-30 MED ORDER — MELATONIN 3 MG PO TABS
3.0000 mg | ORAL_TABLET | Freq: Every evening | ORAL | Status: DC | PRN
  Administered 2024-05-01 – 2024-05-02 (×2): 3 mg via ORAL
  Filled 2024-04-30 (×2): qty 1

## 2024-04-30 MED ORDER — SODIUM CHLORIDE 0.9 % IV SOLN: INTRAVENOUS | Status: AC

## 2024-04-30 MED ORDER — HYDRALAZINE HCL 20 MG/ML IJ SOLN
10.0000 mg | INTRAMUSCULAR | Status: DC | PRN
  Administered 2024-04-30 – 2024-05-02 (×7): 10 mg via INTRAVENOUS
  Filled 2024-04-30 (×7): qty 1

## 2024-04-30 MED ORDER — INSULIN ASPART 100 UNIT/ML IJ SOLN
0.0000 [IU] | Freq: Three times a day (TID) | INTRAMUSCULAR | Status: DC
  Administered 2024-05-01: 3 [IU] via SUBCUTANEOUS
  Administered 2024-05-01: 2 [IU] via SUBCUTANEOUS
  Administered 2024-05-02: 3 [IU] via SUBCUTANEOUS
  Administered 2024-05-02: 2 [IU] via SUBCUTANEOUS
  Administered 2024-05-02 – 2024-05-03 (×2): 1 [IU] via SUBCUTANEOUS
  Administered 2024-05-03: 2 [IU] via SUBCUTANEOUS
  Filled 2024-04-30 (×2): qty 2
  Filled 2024-04-30: qty 3
  Filled 2024-04-30: qty 1
  Filled 2024-04-30: qty 2
  Filled 2024-04-30: qty 3

## 2024-04-30 MED ORDER — ASPIRIN 81 MG PO TBEC
81.0000 mg | DELAYED_RELEASE_TABLET | Freq: Every day | ORAL | Status: DC
  Administered 2024-04-30 – 2024-05-03 (×4): 81 mg via ORAL
  Filled 2024-04-30 (×4): qty 1

## 2024-04-30 MED ORDER — LORAZEPAM 2 MG/ML IJ SOLN
1.0000 mg | Freq: Once | INTRAMUSCULAR | Status: AC
  Administered 2024-04-30: 1 mg via INTRAVENOUS
  Filled 2024-04-30: qty 1

## 2024-04-30 MED ORDER — SODIUM CHLORIDE 0.9 % IV SOLN
1.0000 g | INTRAVENOUS | Status: DC
  Administered 2024-04-30 – 2024-05-02 (×3): 1 g via INTRAVENOUS
  Filled 2024-04-30 (×3): qty 10

## 2024-04-30 MED ORDER — SODIUM CHLORIDE 0.9 % IV BOLUS (SEPSIS)
500.0000 mL | Freq: Once | INTRAVENOUS | Status: AC
  Administered 2024-04-30: 500 mL via INTRAVENOUS

## 2024-04-30 MED ORDER — ATORVASTATIN CALCIUM 80 MG PO TABS
80.0000 mg | ORAL_TABLET | Freq: Every day | ORAL | Status: DC
  Administered 2024-04-30 – 2024-05-03 (×4): 80 mg via ORAL
  Filled 2024-04-30 (×4): qty 1

## 2024-04-30 NOTE — Progress Notes (Addendum)
" °  Carryover admission to the Day Admitter.  I discussed this case with the EDP, Dr. Midge.  Per these discussions:   This is a 63 year old female with a history of type 2 diabetes mellitus, ischemic stroke with residual left-sided deficits, who is being admitted with suspected acute metabolic encephalopathy in the setting of acute cystitis as well as hypercalcemia after presenting from her skilled nursing facility for evaluation of 1 to 2 days of altered mental status, confusion relative to baseline mental status.  Vital signs in the ED were notable for the following: Afebrile; sinus tachycardia with initial heart rates in the 120s, subsequent decreasing into the 80s following initiation of IV fluids.   Labs notable for CBC demonstrating white blood cell count of 6400.  CMP notable for calcium  level of 10.9.  Total CPK 96.  Lactic acid 1.2.  Urinalysis shows 21-50 white blood cells, many bacteria, no evidence of succumbs epithelial cells.  Urine culture collected followed by initiation of Rocephin .  Presenting high sensitive troponin T was 131, without a prior high-sensitivity troponin T values available for point comparison.  EKGs reported showed no evidence of acute ischemic changes.  There is suspicion for type II supply/demand mismatch in the context of her underlying infectious process as well as initial sinus tachycardia.  Presentation is not been associate with any report of chest pain.   Rather, the patient has been complaining of pain in the bilateral lower extremities, but without any evidence of swelling, erythema.  Bilateral lower extremities, per EDP, were noted to be warm, well-perfused, with soft compartments.   The patient was agitated in the ED, she subsequently received Seroquel , as well as a dose of IV fentanyl  for her report of discomfort in the bilateral lower extremities.  These medications were only mildly effective in addressing her agitation.  She subsequently received Ativan   1 mg IV x 1 dose, following which patient's agitation improved significantly.  She also received dose of IV Rocephin  for underlying urinary tract infection.   Regarding her report of discomfort in the bilateral lower extremities, she is noted to have a history of type 2 diabetes mellitus.  Unclear at this time if this is complicated by diabetic peripheral polyneuropathy.  I have placed an order for observation for further evaluation management of the above.  I have placed some additional preliminary admit orders via the adult multi-morbid admission order set. I have also ordered additional Rocephin  for her acute cystitis, as well as a one-time prn dose of IV Ativan  for any additional agitation.  I have ordered morning labs in the form of CMP, CBC, magnesium  level, and repeat troponin.  Additionally, in the setting of her hypercalcemia, I have ordered lactated Ringer 's at 75 cc/h x 10 hours, and added on a phosphorus level.  For her type 2 diabetes mellitus, I have ordered CBG monitoring on before every meal/at bedtime basis with associated low-dose sliding scale insulin .   Eva Pore, DO Hospitalist  "

## 2024-04-30 NOTE — Progress Notes (Signed)
 Pt has arrived back from MRI .

## 2024-04-30 NOTE — H&P (Addendum)
 " History and Physical    Patient: [name] FMW:[id number] DOB: [birth year] DOA: 04/29/2024 DOS: the patient was seen and examined on 04/30/2024 . PCP: [name], DO  Patient coming from: SNF. Chief complaint: Chief Complaint  Patient presents with   Back Pain   Hypertension   HPI:  [name] is a 63 y.o. 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  Patient at baseline has left-sided deficits from previous stroke.  Patient also has history of UTIs.  Initial vitals show blood pressure of 206/108 heart rate of 128 O2 sats 97% blood glucose 108 temperature 99.2. Admission requested for back pain and leg pain.   Chart review shows patient had a recent hospitalization on October 31 for altered mental status and hallucinations and she was discharged on the fifth after workup for the same MRI at that time was negative, nonacute EEG, negative CT maxillofacial and neck.  With recommendations for minimizing benzodiazepines narcotics and lowered baclofen  doses. Pt is a limited historian and seen in ed room 12.   ED Course:  Vital signs in the ED were notable for the following:  Vitals:   04/30/24 1509 04/30/24 1513 04/30/24 1515 04/30/24 1744  BP: (!) 195/107 (!) 195/107 (!) 169/106 (!) 165/88  Pulse: (!) 109  (!) 110 (!) 110  Temp:      Resp: (!) 22  19 20   SpO2: 96%  96% 95%  TempSrc:    Axillary   >>ED evaluation thus far shows: Initial blood work showing white count of 6400 CMP showing calcium  of 10.9 CPK of 96 lactic of 1.2 and  urinalysis with 21-50 WBCs. EKG shows sinus rhythm at 91 with QTc of 441, nonspecific T wave abnormality negative for any ischemic changes, suspect demand ischemia from her sinus tachycardia..  proBNP of 1432 troponin of 131, repeat 124 repeat at 147. - Abnormal urinalysis with 21-50 white blood cells many bacteria and culture ordered along with Rocephin .   >>While in the ED patient received the following: Medications  acetaminophen  (TYLENOL ) tablet 650 mg (has no administration in time range)    Or  acetaminophen  (TYLENOL ) suppository 650 mg (has no administration in time range)  melatonin tablet 3 mg (has no administration in time range)  ondansetron  (ZOFRAN ) injection 4 mg (has no administration in time range)  LORazepam  (ATIVAN ) injection 1 mg (has no administration in time range)  cefTRIAXone  (ROCEPHIN ) 1 g in sodium chloride  0.9 % 100 mL IVPB (has no administration in time range)  insulin  aspart (novoLOG ) injection 0-9 Units ( Subcutaneous Not Given 04/30/24 0803)  lactated ringers  infusion ( Intravenous New Bag/Given 04/30/24 0538)  sodium chloride  0.9 % bolus 500 mL (0 mLs Intravenous Stopped 04/29/24 2050)  QUEtiapine  (SEROQUEL ) tablet 25 mg (25 mg Oral Given 04/29/24 2110)  fentaNYL  (SUBLIMAZE ) injection 50 mcg (50 mcg Intravenous Given 04/30/24 0028)  sodium chloride  0.9 % bolus 500 mL (0 mLs Intravenous Stopped 04/30/24 0059)  LORazepam  (ATIVAN ) injection 1 mg (1 mg Intravenous Given 04/30/24 0117)  cefTRIAXone  (ROCEPHIN ) 1 g in sodium chloride  0.9 % 100 mL IVPB (0 g Intravenous Stopped 04/30/24 0244)   Review of Systems  Unable to perform ROS: Acuity of condition   Past Medical History:  Diagnosis Date   Abnormality of gait 05/10/2010   BACK PAIN 11/14/2008   Class 1 obesity due to excess calories with body mass index (BMI) of 31.0 to 31.9 in adult 02/07/2021   DIABETES MELLITUS, TYPE II 07/15/2008   Diplopia 07/15/2008   ECZEMA, ATOPIC 04/03/2009   GERD  (gastroesophageal reflux disease)    Guillain-Barre 1988   HYPERLIPIDEMIA 03/06/2009   HYPERTENSION 07/15/2008   Stroke (HCC) 2010, 2011   x2    Vertebral artery stenosis    Past Surgical History:  Procedure Laterality Date   ABDOMINAL HYSTERECTOMY     BIOPSY  07/08/2022   Procedure: BIOPSY;  Surgeon: Leigh Elspeth SQUIBB, MD;  Location: [facility] ENDOSCOPY;  Service: Gastroenterology;;   BIOPSY  10/12/2022   Procedure: BIOPSY;  Surgeon: San Sandor GAILS, DO;  Location: [facility] ENDOSCOPY;  Service: Gastroenterology;;   COLONOSCOPY WITH PROPOFOL  N/A 07/08/2022   Procedure: COLONOSCOPY WITH PROPOFOL ;  Surgeon: Leigh Elspeth SQUIBB, MD;  Location: [facility] ENDOSCOPY;  Service: Gastroenterology;  Laterality: N/A;   DILATION AND CURETTAGE OF UTERUS     FLEXIBLE SIGMOIDOSCOPY N/A 10/12/2022   Procedure: FLEXIBLE SIGMOIDOSCOPY;  Surgeon: San Sandor GAILS, DO;  Location: [facility] ENDOSCOPY;  Service: Gastroenterology;  Laterality: N/A;   FOOT SURGERY     IR ANGIO INTRA EXTRACRAN SEL COM CAROTID INNOMINATE UNI L MOD SED  09/17/2021   IR ANGIO INTRA EXTRACRAN SEL INTERNAL CAROTID UNI R MOD SED  09/17/2021   IR ANGIO VERTEBRAL SEL VERTEBRAL UNI R MOD SED  09/17/2021   IR US  GUIDE VASC ACCESS RIGHT  09/17/2021    reports that she has never smoked. She has never used smokeless tobacco. She reports that she does not drink alcohol and does not use drugs. Allergies[1] Family History  Problem Relation Age of Onset   Diabetes Sister    Asthma Other    Stroke Other    Hypertension Other    Diabetes Mother    Stroke Mother    Cancer Father        pt states hae had some kind of stomach cancer, ? stomach or colon   Breast cancer Neg Hx    Prior to Admission medications  Medication Sig Start Date End Date Taking? Authorizing Provider  acetaminophen  (TYLENOL ) 325 MG tablet Take 650 mg by mouth every 4 (four) hours as needed for mild pain (pain score 1-3) or fever. Do not exceed 3000mg  in 24 hours   Yes [provider]   amLODipine  (NORVASC ) 10 MG tablet Take 1 tablet by mouth once daily 05/09/22  Yes Jordan, [name], MD  ammonium lactate (LAC-HYDRIN) 12 % lotion Apply 1 Application topically daily. Apply to bilateral feet and lower legs topically every day shift for dry skin   Yes [provider]  aspirin  EC 81 MG tablet Take 1 tablet (81 mg total) by mouth daily. Swallow whole. 06/18/22  Yes Krishnan, Gokul, MD  atorvastatin  (LIPITOR ) 80 MG tablet Take 1 tablet (80 mg total) by mouth daily. 05/09/22  Yes Jordan, [name], MD  baclofen  5 MG TABS Take 4 tablets (20 mg total) by mouth 3 (three) times daily as needed for muscle spasms. Patient taking differently: Take 20 mg by mouth 3 (three) times daily. 03/06/24  Yes Dennise Lavada POUR, MD  Benzocaine-Menthol  (ORAJEL 2X TOOTHACHE & GUM) 20-0.26 % GEL Use as directed 1 Application in the mouth or throat every 4 (four) hours as needed.  Yes [provider]  busPIRone  (BUSPAR ) 10 MG tablet Take 10 mg by mouth 2 (two) times daily. 02/29/24  Yes [provider]  cloNIDine (CATAPRES - DOSED IN MG/24 HR) 0.1 mg/24hr patch Place 0.1 mg onto the skin every Saturday. 08/28/23  Yes [provider]  Cranberry (SM CRANBERRY) 300 MG tablet Take 300 mg by mouth 2 (two) times daily.   Yes [provider]  ezetimibe  (ZETIA ) 10 MG tablet Take 1 tablet (10 mg total) by mouth daily. 05/09/22  Yes Jordan, [name], MD  ferrous sulfate 325 (65 FE) MG tablet Take 325 mg by mouth daily at 12 noon.   Yes [provider]  FLUoxetine  (PROZAC ) 40 MG capsule Take 40 mg by mouth daily. 04/25/23  Yes [provider]  guaiFENesin  (ROBITUSSIN) 100 MG/5ML liquid Take 10 mLs by mouth every 4 (four) hours as needed for cough or to loosen phlegm.   Yes [provider]  hydrochlorothiazide  (HYDRODIURIL ) 12.5 MG tablet Take 1 tablet (12.5 mg total) by mouth daily. 03/09/24  Yes Singh, Prashant K, MD  insulin  aspart (NOVOLOG ) 100 UNIT/ML  injection Inject 0-10 Units into the skin 3 (three) times daily before meals. Before each meal 3 times a day, 140-199 - 4 units, 200-250 - 6 units, 251-299 - 8 units,  300-349 - 10 units,  350 or above 12 units. Patient taking differently: Inject 0-12 Units into the skin 3 (three) times daily before meals. Before each meal 3 times a day, 140-199 - 4 units, 200-250 - 6 units, 251-299 - 8 units,  300-349 - 10 units,  350 or above 12 units. 03/06/24  Yes Singh, Prashant K, MD  insulin  glargine (LANTUS ) 100 UNIT/ML injection Inject 20 Units into the skin daily at 2 PM.   Yes [provider]  Lidocaine  HCl (ASPERCREME LIDOCAINE ) 4 % CREA Apply 1 Application topically daily as needed (bilateral feet pain).   Yes [provider]  melatonin 5 MG TABS Take 5 mg by mouth at bedtime as needed (insomnia).   Yes [provider]  metFORMIN  (GLUCOPHAGE ) 500 MG tablet Take 500 mg by mouth 2 (two) times daily with a meal.   Yes [provider]  pantoprazole  (PROTONIX ) 40 MG tablet Take 1 tablet (40 mg total) by mouth daily. 10/17/22 11/29/24 Yes Pahwani, Fredia, MD  senna-docusate (SENOKOT-S) 8.6-50 MG tablet Take 1 tablet by mouth daily as needed for mild constipation.   Yes [provider]  traMADol  HCl 25 MG TABS Take 25 mg by mouth every 8 (eight) hours as needed (left leg pain).   Yes [provider]  venetoclax  (VENCLEXTA ) 100 MG tablet Take 2 tablets (200 mg total) by mouth daily. Tablets should be swallowed whole with a meal and a full glass of water. 04/23/24 05/27/24 Yes Onesimo Emaline Brink, MD                                                                                 Vitals:   04/30/24 1509 04/30/24 1513 04/30/24 1515 04/30/24 1744  BP: (!) 195/107 (!) 195/107 (!) 169/106 (!) 165/88  Pulse: (!) 109  (!) 110 (!) 110  Resp: (!) 22  19 20  Temp:      TempSrc:    Axillary  SpO2: 96%  96% 95%   Physical Exam Vitals reviewed.  Constitutional:       General: She is not in acute distress.    Appearance: She is not ill-appearing.  HENT:     Head: Normocephalic and atraumatic.  Eyes:     Extraocular Movements: Extraocular movements intact.  Cardiovascular:     Rate and Rhythm: Normal rate and regular rhythm.     Pulses: Normal pulses.     Heart sounds: Normal heart sounds.  Pulmonary:     Breath sounds: Wheezing present.  Abdominal:     General: There is no distension.     Palpations: Abdomen is soft.     Tenderness: There is no abdominal tenderness.  Musculoskeletal:     Right lower leg: No edema.     Left lower leg: No edema.  Neurological:     Mental Status: She is alert.     Cranial Nerves: Dysarthria present.     Motor: Weakness present.     Deep Tendon Reflexes:     Reflex Scores:      Bicep reflexes are 1+ on the right side and 1+ on the left side.      Patellar reflexes are 1+ on the right side and 1+ on the left side.    Comments: Left sided weakness in LUE.       Labs on Admission: I have personally reviewed following labs and imaging studies CBC: Recent Labs  Lab 04/29/24 1904 04/29/24 1909 04/30/24 0758  WBC 6.4  --  4.4  NEUTROABS 4.8  --  2.7  HGB 11.7* 11.9* 10.7*  HCT 35.9* 35.0* 32.3*  MCV 83.3  --  82.6  PLT 254  --  215   Basic Metabolic Panel: Recent Labs  Lab 04/29/24 1904 04/29/24 1909 04/30/24 0758  NA 143 144 144  K 3.7 3.6 3.3*  CL 102 102 108  CO2 28  --  27  GLUCOSE 98 103* 95  BUN 25* 25* 22  CREATININE 0.82 0.90 0.74  CALCIUM  10.9*  --  10.0  MG  --   --  1.7  PHOS  --   --  2.7   GFR: CrCl cannot be calculated (Unknown ideal weight.). Liver Function Tests: Recent Labs  Lab 04/29/24 1904 04/30/24 0758  AST 26 23  ALT 22 20  ALKPHOS 80 71  BILITOT 0.4 0.3  PROT 7.9 6.9  ALBUMIN 4.0 3.5   No results for input(s): LIPASE, AMYLASE in the last 168 hours. No results for input(s): AMMONIA in the last 168 hours. Recent Labs    03/01/24 1505 03/02/24 0422  03/02/24 0633 03/03/24 0434 03/04/24 0459 03/05/24 0414 03/06/24 0245 04/29/24 1904 04/29/24 1909 04/30/24 0758  BUN 25* 26* 26* 35* 43* 51* 47* 25* 25* 22  CREATININE 1.18* 1.06* 1.13* 1.29* 1.86* 1.89* 1.82* 0.82 0.90 0.74    Cardiac Enzymes: Recent Labs  Lab 04/30/24 0250  CKTOTAL 96   BNP (last 3 results) Recent Labs    04/30/24 0946  PROBNP 1,432.0*   HbA1C: No results for input(s): HGBA1C in the last 72 hours. CBG: Recent Labs  Lab 04/30/24 0800 04/30/24 1142 04/30/24 1625  GLUCAP 87 103* 114*   Lipid Profile: No results for input(s): CHOL, HDL, LDLCALC, TRIG, CHOLHDL, LDLDIRECT in the last 72 hours. Thyroid  Function Tests: Recent Labs    04/30/24 0025  TSH 0.588  Anemia Panel: No results for input(s): VITAMINB12, FOLATE, FERRITIN, TIBC, IRON, RETICCTPCT in the last 72 hours. Urine analysis:    Component Value Date/Time   COLORURINE YELLOW 04/30/2024 0112   APPEARANCEUR CLOUDY (A) 04/30/2024 0112   LABSPEC 1.009 04/30/2024 0112   PHURINE 7.0 04/30/2024 0112   GLUCOSEU NEGATIVE 04/30/2024 0112   HGBUR SMALL (A) 04/30/2024 0112   HGBUR negative 03/12/2010 0847   BILIRUBINUR NEGATIVE 04/30/2024 0112   BILIRUBINUR n 09/11/2015 1039   KETONESUR NEGATIVE 04/30/2024 0112   PROTEINUR >=300 (A) 04/30/2024 0112   UROBILINOGEN 1.0 09/11/2015 1039   UROBILINOGEN 1.0 05/19/2014 1858   NITRITE NEGATIVE 04/30/2024 0112   LEUKOCYTESUR SMALL (A) 04/30/2024 0112   Radiological Exams on Admission: CT Angio Chest PE W and/or Wo Contrast Result Date: 04/30/2024 EXAM: CTA CHEST 04/30/2024 10:14:21 AM TECHNIQUE: CTA of the chest was performed without and with the administration of 75 mL of iohexol  (OMNIPAQUE ) 350 MG/ML injection. Multiplanar reformatted images are provided for review. MIP images are provided for review. Automated exposure control, iterative reconstruction, and/or weight based adjustment of the mA/kV was utilized to reduce  the radiation dose to as low as reasonably achievable. COMPARISON: None available. CLINICAL HISTORY: Pulmonary embolism (PE) suspected, high prob. FINDINGS: PULMONARY ARTERIES: Pulmonary arteries are adequately opacified for evaluation. No acute pulmonary embolus. The main pulmonary artery measures at the upper limits of normal at 3.2 cm. The limited evaluation of the subsegmental level is due to motion artifact. MEDIASTINUM: Aberrant right subclavian arteryis noted. The thoracic aorta is normal in caliber. The heart and pericardium demonstrate no acute abnormality other than cardiomegaly. LYMPH NODES: No mediastinal, hilar or axillary lymphadenopathy. LUNGS AND PLEURA: Bibasilar linear atelectasis versus scarring. Expiratory phase of respiration. Mosaic attenuation of the lungs. No focal consolidation or pulmonary edema. No evidence of pleural effusion or pneumothorax. UPPER ABDOMEN: Colonic diverticulosis. SOFT TISSUES AND BONES: Enlarged heterogeneous right thyroid  gland with underlying hyperdense and hypodense masses. No acute bone abnormality. Heterogenous appearance of the spine suggestive of osteopenia. IMPRESSION: 1. No pulmonary embolism. Limited evaluation at the subsegmental level due to motion artifact and adjacent lung disease. 2. No acute findings in the setting of expiratory phase of respiration and linear scarring/atelectasis. 3. Prominent pulmonary artery size - correlate for pulmonary hypertension. 4. Cardiomegaly. 5. Enlarged heterogeneous right thyroid  gland with underlying masses; recommend non-emergent thyroid  ultrasound. Electronically signed by: Morgane Naveau MD 04/30/2024 11:58 AM EST RP Workstation: [id number]   DG Femur Portable Min 2 Views Left Result Date: 04/30/2024 EXAM: 2 VIEW(S) XRAY OF THE _LATERALITY_ FEMUR 04/30/2024 01:17:00 AM COMPARISON: None available. CLINICAL HISTORY: pain FINDINGS: BONES AND JOINTS: No acute fracture. No malalignment. SOFT TISSUES: Vascular  calcifications. IMPRESSION: 1. No acute findings. Electronically signed by: Franky Stanford MD 04/30/2024 02:56 AM EST RP Workstation: [id number]   DG Chest Portable 1 View Result Date: 04/30/2024 EXAM: 1 VIEW(S) XRAY OF THE CHEST 04/30/2024 01:18:00 AM COMPARISON: 03/01/2024 CLINICAL HISTORY: pain FINDINGS: LUNGS AND PLEURA: Low lung volumes. Stable linear atelectasis or scarring in left mid lung. No pleural effusion. No pneumothorax. HEART AND MEDIASTINUM: Aortic atherosclerosis. No acute abnormality of the cardiac silhouette. BONES AND SOFT TISSUES: No acute osseous abnormality. IMPRESSION: 1. Stable linear atelectasis or scarring in left mid lung. 2. Low lung volumes. Electronically signed by: Franky Stanford MD 04/30/2024 02:56 AM EST RP Workstation: [id number]   DG Femur Portable Min 2 Views Right Result Date: 04/30/2024 EXAM: 2 VIEW(S) XRAY OF THE FEMUR 04/30/2024 01:14:00 AM COMPARISON: None available. CLINICAL HISTORY:  pain FINDINGS: BONES AND JOINTS: No acute fracture. No malalignment. SOFT TISSUES: Scattered femoral arterial calcifications. IMPRESSION: 1. No acute findings. Electronically signed by: Franky Stanford MD 04/30/2024 02:55 AM EST RP Workstation: [id number]   CT ABDOMEN PELVIS WO CONTRAST Result Date: 04/29/2024 EXAM: CT ABDOMEN AND PELVIS WITHOUT CONTRAST 04/29/2024 07:26:02 PM TECHNIQUE: CT of the abdomen and pelvis was performed without the administration of intravenous contrast. Multiplanar reformatted images are provided for review. Automated exposure control, iterative reconstruction, and/or weight-based adjustment of the mA/kV was utilized to reduce the radiation dose to as low as reasonably achievable. COMPARISON: Findings are compared to 10/04/2022. CLINICAL HISTORY: Abdominal pain, acute, nonlocalized. Chronic lymphocytic leukemia. Tracking code: Bo. FINDINGS: LOWER CHEST: No acute abnormality. LIVER: The liver is unremarkable. GALLBLADDER AND BILE DUCTS: Gallbladder is  unremarkable. No biliary ductal dilatation. SPLEEN: No acute abnormality. PANCREAS: No acute abnormality. ADRENAL GLANDS: No acute abnormality. KIDNEYS, URETERS AND BLADDER: Right kidney: Simple cortical cyst noted within the lower pole of the right kidney for which no follow up imaging is recommended. Left kidney: Left lower pole exophytic low attenuation lesion was previously characterized as a simple cyst and demonstrates interval decrease in size in keeping with an involuting cyst. Again no follow up imaging is recommended for this lesion. Per consensus, no follow-up is needed for simple Bosniak type 1 and 2 renal cysts, unless the patient has a malignancy history or risk factors. Mild nonspecific bilateral perinephric stranding is unchanged. No stones in the kidneys or ureters. No hydronephrosis. No periureteral stranding. Urinary bladder: Gas within the bladder lumen is nonspecific, possibly related to recent catheterization. Urosepsis. GI AND BOWEL: Stomach demonstrates no acute abnormality. Appendix normal. The small bowel and large bowel are otherwise unremarkable. There is no bowel obstruction. PERITONEUM AND RETROPERITONEUM: No ascites. No free air. VASCULATURE: Aorta is normal in caliber. No aortic aneurysm. Mild aortoiliac atherosclerotic calcification. LYMPH NODES: Previously noted pathologic retroperitoneal and pelvic adenopathy has resolved. No pathologic residual adenopathy is seen within the abdomen and pelvis. REPRODUCTIVE ORGANS: No adnexal masses. BONES AND SOFT TISSUES: The osseous structures are diffusely osteopenic, progressive since prior examination. Tiny fat-containing umbilical hernia. IMPRESSION: 1. No acute findings in the abdomen or pelvis to explain abdominal pain. 2. Resolution of previously noted pathologic retroperitoneal and pelvic adenopathy, with no pathologic residual adenopathy in the abdomen or pelvis. 3. Gas within the bladder lumen, possibly related to recent  catheterization. Correlation with clinical history is recommended. 4. Diffuse osteopenia, progressive since prior examination Electronically signed by: Dorethia Molt MD 04/29/2024 09:54 PM EST RP Workstation: [id number]   CT Head Wo Contrast Result Date: 04/29/2024 EXAM: CT HEAD WITHOUT CONTRAST 04/29/2024 07:26:02 PM TECHNIQUE: CT of the head was performed without the administration of intravenous contrast. Automated exposure control, iterative reconstruction, and/or weight based adjustment of the mA/kV was utilized to reduce the radiation dose to as low as reasonably achievable. COMPARISON: 03/01/2024 CLINICAL HISTORY: Mental status change, unknown cause FINDINGS: BRAIN AND VENTRICLES: No acute hemorrhage. No evidence of acute infarct. No hydrocephalus. No extra-axial collection. No mass effect or midline shift. Moderate vascular calcifications within the carotid siphons. ORBITS: No acute abnormality. SINUSES: No acute abnormality. SOFT TISSUES AND SKULL: No acute soft tissue abnormality. No skull fracture. IMPRESSION: 1. No acute intracranial abnormality. Electronically signed by: Franky Stanford MD 04/29/2024 08:34 PM EST RP Workstation: [id number]   Data Reviewed: Relevant notes from primary care and specialist visits, past discharge summaries as available in EHR, including Care Everywhere . Prior diagnostic testing as pertinent  to current admission diagnoses, Updated medications and problem lists for reconciliation .ED course, including vitals, labs, imaging, treatment and response to treatment,Triage notes, nursing and pharmacy notes and ED provider's notes.Notable results as noted in HPI.Discussed case with EDMD/ ED APP/ or Specialty MD on call and as needed.  Assessment & Plan  >> Altered mental status: Pt at bedside is awake and cooperative. 2/2 to UTI and is much more interactive. But with her h/o cva I think it is prudent to obtain MRI brain non contrast and rule out tia/cva.  Metabolic  encephalopathy is also possibility with patient being hypercalcemic.  At bedside patient is awake slurred speech and left-sided weakness which is there from her previous stroke.   >> Cystitis/ back pain: Cont rocephin  1 g every 24 hourly and follow C/S. Gentle ivf hydration.    >> Abnormal Troponins/ proBNP:  04/30/24 02:50 04/30/24 07:58 04/30/24 09:46 04/30/24 14:07  Pro Brain Natriuretic Peptide   1,432.0 (H)   CK Total 96     Troponin T High Sensitivity 131 (HH) 124 (HH) 147 (HH) 131 (HH)  Based on most recent echo as below her  1. Left ventricular ejection fraction, by estimation, is 60 to 65%. The  left ventricle has normal function. The left ventricle has no regional  wall motion abnormalities. There is mild concentric left ventricular  hypertrophy. Left ventricular diastolic  parameters are consistent with Grade I diastolic dysfunction (impaired  relaxation).   2. Right ventricular systolic function is normal. The right ventricular  size is normal. Tricuspid regurgitation signal is inadequate for assessing  PA pressure.   3. The mitral valve is grossly normal. No evidence of mitral valve  regurgitation. No evidence of mitral stenosis.   4. The aortic valve is tricuspid. Aortic valve regurgitation is not  visualized. No aortic stenosis is present.   5. The inferior vena cava is normal in size with greater than 50%  respiratory variability, suggesting right atrial pressure of 3 mmHg.     >>BL LE pain: 2/2 to her chronic wounds. I did not note any wounds on her legs on inspection.  As needed morphine .   >> Essential hypertension: Vitals:   04/30/24 0330 04/30/24 0600 04/30/24 0700 04/30/24 0800  BP: (!) 145/98 (!) 158/94 (!) 149/95 (!) 178/92   04/30/24 1000 04/30/24 1015 04/30/24 1034 04/30/24 1145  BP: (!) 204/124 (!) 210/119 (!) 177/106 (!) 168/96   04/30/24 1509 04/30/24 1513 04/30/24 1515 04/30/24 1744  BP: (!) 195/107 (!) 195/107 (!) 169/106 (!) 165/88  PTA meds  include amlodipine  10, clonidine patch 0.1 mg, HCTZ 12.5 PRN hydralazine . Resume home meds once pt is abel to swallow.    >> Hyperthyroidism: Tft. Currently on no meds or methimazole .   >> GERD: IV PPI. Aspiration precaution and swallow eval.    >> Diabetes mellitus type 2: Glycemic protocol with accu checks q 4 hourly.  03/2024 A1c of 7.6. Swallow evals.  Home PTA meds include metformin / NovoLog .   >> History of CVA: Continue patient on aspirin  81 and statin therapy.   >>Anemia:    Latest Ref Rng & Units 04/30/2024    7:58 AM 04/29/2024    7:09 PM 04/29/2024    7:04 PM  CBC  WBC 4.0 - 10.5 K/uL 4.4   6.4   Hemoglobin 12.0 - 15.0 g/dL 89.2  88.0  88.2   Hematocrit 36.0 - 46.0 % 32.3  35.0  35.9   Platelets 150 - 400 K/uL 215   254  Shows she has anemia since November 2023 suspect anemia of chronic disease.  Indicis showed her MCV is normocytic however in the past has mostly been microcytic concerning for [name].  Suspect this is a combination of chronic disease and chronic GI loss.  Will type and screen, IV PPI, stool occult.    DVT prophylaxis:  Heparin .  Consults:  None.  Advance Care Planning:    Code Status: Full Code   Family Communication:  None Disposition Plan:  SNF Severity of Illness: The appropriate patient status for this patient is INPATIENT. Inpatient status is judged to be reasonable and necessary in order to provide the required intensity of service to ensure the patient's safety. The patient's presenting symptoms, physical exam findings, and initial radiographic and laboratory data in the context of their chronic comorbidities is felt to place them at high risk for further clinical deterioration. Furthermore, it is not anticipated that the patient will be medically stable for discharge from the hospital within 2 midnights of admission.   * I certify that at the point of admission it is my clinical judgment that the patient will require inpatient  hospital care spanning beyond 2 midnights from the point of admission due to high intensity of service, high risk for further deterioration and high frequency of surveillance required.*  Unresulted Labs (From admission, onward)     Start     Ordered   05/01/24 0500  Lipid panel  (Labs)  Tomorrow morning,   R       Comments: Fasting    04/30/24 1815   05/01/24 0500  Occult blood card to lab, stool RN will collect  Daily,   R     Question:  Specimen to be collected by:  Answer:  RN will collect   04/30/24 1845   04/30/24 1846  Vitamin B12  (Anemia Panel (PNL))  Once,   R        04/30/24 1845   04/30/24 1846  Folate  (Anemia Panel (PNL))  Once,   R        04/30/24 1845   04/30/24 1846  Iron and TIBC  (Anemia Panel (PNL))  Once,   R        04/30/24 1845   04/30/24 1846  Ferritin  (Anemia Panel (PNL))  Once,   R        04/30/24 1845   04/30/24 1846  Reticulocytes  (Anemia Panel (PNL))  Once,   R        04/30/24 1845   04/30/24 1846  Type and screen  Once,   R        04/30/24 1845   04/30/24 1830  TSH  Add-on,   AD        04/30/24 1830   04/30/24 1830  T4, free  Add-on,   AD        04/30/24 1830   04/30/24 0112  Urine Culture  Once,   R        04/30/24 0112            Meds ordered this encounter  Medications   sodium chloride  0.9 % bolus 500 mL   QUEtiapine  (SEROQUEL ) tablet 25 mg   fentaNYL  (SUBLIMAZE ) injection 50 mcg   sodium chloride  0.9 % bolus 500 mL   LORazepam  (ATIVAN ) injection 1 mg   cefTRIAXone  (ROCEPHIN ) 1 g in sodium chloride  0.9 % 100 mL IVPB    Antibiotic Indication::   UTI   OR Linked Order Group  acetaminophen  (TYLENOL ) tablet 650 mg    acetaminophen  (TYLENOL ) suppository 650 mg   melatonin tablet 3 mg   ondansetron  (ZOFRAN ) injection 4 mg   LORazepam  (ATIVAN ) injection 1 mg   cefTRIAXone  (ROCEPHIN ) 1 g in sodium chloride  0.9 % 100 mL IVPB    Antibiotic Indication::   UTI   insulin  aspart (novoLOG ) injection 0-9 Units    Correction coverage::    Sensitive (thin, NPO, renal)    CBG < 70::   Implement Hypoglycemia Standing Orders and refer to Hypoglycemia Standing Orders sidebar report    CBG 70 - 120::   0 units    CBG 121 - 150::   1 unit    CBG 151 - 200::   2 units    CBG 201 - 250::   3 units    CBG 251 - 300::   5 units    CBG 301 - 350::   7 units    CBG 351 - 400:   9 units    CBG > 400:   call MD and obtain STAT lab verification   lactated ringers  infusion   hydrALAZINE  (APRESOLINE ) injection 10 mg   iohexol  (OMNIPAQUE ) 350 MG/ML injection 75 mL   morphine  (PF) 2 MG/ML injection 2 mg    stroke: early stages of recovery book   pantoprazole  (PROTONIX ) injection 40 mg   heparin  injection 5,000 Units   0.9 %  sodium chloride  infusion   amLODipine  (NORVASC ) tablet 10 mg   aspirin  EC tablet 81 mg   atorvastatin  (LIPITOR ) tablet 80 mg   busPIRone  (BUSPAR ) tablet 10 mg   cloNIDine (CATAPRES - Dosed in mg/24 hr) patch 0.1 mg   ezetimibe  (ZETIA ) tablet 10 mg   FLUoxetine  (PROZAC ) capsule 40 mg   hydrochlorothiazide  (HYDRODIURIL ) tablet 12.5 mg   venetoclax  (VENCLEXTA ) tablet 200 mg     Orders Placed This Encounter  Procedures   Critical Care   Urine Culture   CT ABDOMEN PELVIS WO CONTRAST   CT Head Wo Contrast   DG Chest Portable 1 View   DG Femur Portable Min 2 Views Right   DG Femur Portable Min 2 Views Left   CT Angio Chest PE W and/or Wo Contrast   MR BRAIN WO CONTRAST   CBC with Differential   Comprehensive metabolic panel   Urinalysis, w/ Reflex to Culture (Infection Suspected) -Urine, Clean Catch   TSH   CK   CBC with Differential/Platelet   Comprehensive metabolic panel with GFR   Magnesium    Phosphorus   Pro Brain natriuretic peptide   Procalcitonin   Lipid panel   TSH   T4, free   Vitamin B12   Folate   Iron and TIBC   Ferritin   Reticulocytes   Occult blood card to lab, stool RN will collect   Diet NPO time specified   In and Out Cath   Cardiac Monitoring Continuous x 24 hours Indications  for use: Other; other indications for use: monitor for acute ischemic changes   SCDs   Patient has an active order for admit to inpatient/place in observation   Vital signs   Mobility Protocol: No Restrictions   Refer to Sidebar Report Mobility Protocol for Adult Inpatient   Initiate Adult Central Line Maintenance and Catheter Clearance Protocol for patients with central line (CVC, PICC, Port, Hemodialysis, Trialysis)   Daily weights   Intake and Output   Initiate CHG Protocol for patients in ICU/SD or any patient with a central line or  foley catheter   Do not place and if present remove PureWick   Initiate Oral Care Protocol   Initiate Carrier Fluid Protocol   RN may order General Admission PRN Orders utilizing General Admission PRN medications (through manage orders) for the following patient needs: allergy symptoms (Claritin), cold sores (Carmex), cough (Robitussin DM), eye irritation (Liquifilm Tears), hemorrhoids (Tucks), indigestion (Maalox), minor skin irritation (Hydrocortisone  Cream), muscle pain Lucienne Gay), nose irritation (saline nasal spray) and sore throat (Chloraseptic spray).   Apply Diabetes Mellitus Care Plan   STAT CBG when hypoglycemia is suspected. If treated, recheck every 15 minutes after each treatment until CBG >/= 70 mg/dl   Refer to Hypoglycemia Protocol Sidebar Report for treatment of CBG < 70 mg/dl   No HS correction Insulin    Notify physician (specify)   Swallow screen - If patient does NOT pass this screen, place order for SLP eval and treat (SLP2) - swallowing evaluation (BSE, MBS and/or diet order as indicated)   RN to notify Physician for appropriate diet after patient passes swallow screen   NIH Stroke Scale   Apply Stroke Care Plan: Ischemic Stroke, TIA   Discuss with patient and document patient's goals for stroke risk factor reduction   Provide stroke education material to patient and family   Nurse to provide smoking / tobacco cessation education   If  the patient has passed the Stroke Swallow Screen or has a feeding tube, then RN may order General Admission PRN Orders (through manage orders) for the following patient needs: allergy symptoms (Claritin), cold sores (Carmex), cough (Robitussin DM), eye irritation (Liquifilm Tears), hemorrhoids (Tucks), indigestion (Maalox), minor skin irritation (hydrocortisone  cream), muscle pain Lucienne Gay), nose irritation (saline nasal spray) and sore throat (Chloraseptic spray).   SCD's   Patient has an active order for admit to inpatient/place in observation   Vital signs   Full code   Consult to hospitalist   OT eval and treat   PT eval and treat   Pulse oximetry check with vital signs   Oxygen therapy Mode or (Route): Nasal cannula; Liters Per Minute: 2; Keep O2 saturation between: greater than 92 %   SLP eval and treat Reason for evaluation: Cognitive/Language evaluation   I-stat chem 8, ED   I-Stat Lactic Acid   CBG monitoring, ED   CBG monitoring, ED   I-Stat CG4 Lactic Acid   CBG monitoring, ED   ED EKG   EKG 12-Lead   EKG 12-Lead   EKG   ECHOCARDIOGRAM COMPLETE BUBBLE STUDY   Type and screen   Place in observation (patient's expected length of stay will be less than 2 midnights)   Fall precautions   Aspiration precautions   Seizure precautions   VAS US  LOWER EXTREMITY VENOUS (DVT)    Author: Mario LULLA Blanch, MD 12 pm- 8 pm. [facility]. 04/30/2024 6:54 PM Please note for any communication after hours contact [facility] Assigned provider on call on Amion.        [1] No Known Allergies  "

## 2024-04-30 NOTE — ED Provider Notes (Signed)
 " [facility] Provider Note   CSN: [id number] Arrival date & time: 04/29/24  1711     Patient presents with: Back Pain and Hypertension  Level 5 caveat due to altered mental status [name] is a 63 y.o. female.   The history is provided by a relative and the patient. The history is limited by the condition of the patient.  Patient with history of previous stroke with left-sided weakness, hypothyroid, hypertension presents with multiple issues.  Patient stays in a rehab facility.  She is reporting increasing pain in both legs from wound  she is unable provide any other history Patient is yelling and screaming throughout the history and exam    Past Medical History:  Diagnosis Date   Abnormality of gait 05/10/2010   BACK PAIN 11/14/2008   Class 1 obesity due to excess calories with body mass index (BMI) of 31.0 to 31.9 in adult 02/07/2021   DIABETES MELLITUS, TYPE II 07/15/2008   Diplopia 07/15/2008   ECZEMA, ATOPIC 04/03/2009   GERD (gastroesophageal reflux disease)    Guillain-Barre 1988   HYPERLIPIDEMIA 03/06/2009   HYPERTENSION 07/15/2008   Stroke (HCC) 2010, 2011   x2    Vertebral artery stenosis     Prior to Admission medications  Medication Sig Start Date End Date Taking? Authorizing Provider  acetaminophen  (TYLENOL ) 325 MG tablet Take 650 mg by mouth every 4 (four) hours as needed for mild pain (pain score 1-3) or fever. Do not exceed 3000mg  in 24 hours   Yes [provider]  amLODipine  (NORVASC ) 10 MG tablet Take 1 tablet by mouth once daily 05/09/22  Yes Jordan, [name], MD  ammonium lactate (LAC-HYDRIN) 12 % lotion Apply 1 Application topically daily. Apply to bilateral feet and lower legs topically every day shift for dry skin   Yes [provider]  aspirin  EC 81 MG tablet Take 1 tablet (81 mg total) by mouth daily. Swallow whole. 06/18/22  Yes Krishnan, Gokul, MD  atorvastatin  (LIPITOR ) 80 MG  tablet Take 1 tablet (80 mg total) by mouth daily. 05/09/22  Yes Jordan, [name], MD  baclofen  5 MG TABS Take 4 tablets (20 mg total) by mouth 3 (three) times daily as needed for muscle spasms. Patient taking differently: Take 20 mg by mouth 3 (three) times daily. 03/06/24  Yes Dennise Lavada POUR, MD  Benzocaine-Menthol  (ORAJEL 2X TOOTHACHE & GUM) 20-0.26 % GEL Use as directed 1 Application in the mouth or throat every 4 (four) hours as needed.   Yes [provider]  busPIRone  (BUSPAR ) 10 MG tablet Take 10 mg by mouth 2 (two) times daily. 02/29/24  Yes [provider]  cloNIDine (CATAPRES - DOSED IN MG/24 HR) 0.1 mg/24hr patch Place 0.1 mg onto the skin every Saturday. 08/28/23  Yes [provider]  Cranberry (SM CRANBERRY) 300 MG tablet Take 300 mg by mouth 2 (two) times daily.   Yes [provider]  ezetimibe  (ZETIA ) 10 MG tablet Take 1 tablet (10 mg total) by mouth daily. 05/09/22  Yes Jordan, [name], MD  ferrous sulfate 325 (65 FE) MG tablet Take 325 mg by mouth daily at 12 noon.   Yes [provider]  FLUoxetine  (PROZAC ) 40 MG capsule Take 40 mg by mouth daily. 04/25/23  Yes [provider]  guaiFENesin  (ROBITUSSIN) 100 MG/5ML liquid Take 10 mLs by mouth every 4 (four) hours as needed for cough or to loosen phlegm.   Yes [provider]  hydrochlorothiazide  (HYDRODIURIL ) 12.5 MG tablet Take 1 tablet (12.5 mg total) by mouth daily. 03/09/24  Yes Singh, Prashant K, MD  insulin  aspart (NOVOLOG ) 100 UNIT/ML injection Inject 0-10 Units into the skin 3 (three) times daily before meals. Before each meal 3 times a day, 140-199 - 4 units, 200-250 - 6 units, 251-299 - 8 units,  300-349 - 10 units,  350 or above 12 units. Patient taking differently: Inject 0-12 Units into the skin 3 (three) times daily before meals. Before each meal 3 times a day, 140-199 - 4 units, 200-250 - 6 units, 251-299 - 8 units,  300-349 - 10 units,  350 or above 12 units.  03/06/24  Yes Singh, Prashant K, MD  insulin  glargine (LANTUS ) 100 UNIT/ML injection Inject 20 Units into the skin daily at 2 PM.   Yes [provider]  Lidocaine  HCl (ASPERCREME LIDOCAINE ) 4 % CREA Apply 1 Application topically daily as needed (bilateral feet pain).   Yes [provider]  melatonin 5 MG TABS Take 5 mg by mouth at bedtime as needed (insomnia).   Yes [provider]  metFORMIN  (GLUCOPHAGE ) 500 MG tablet Take 500 mg by mouth 2 (two) times daily with a meal.   Yes [provider]  pantoprazole  (PROTONIX ) 40 MG tablet Take 1 tablet (40 mg total) by mouth daily. 10/17/22 11/29/24 Yes Pahwani, Fredia, MD  senna-docusate (SENOKOT-S) 8.6-50 MG tablet Take 1 tablet by mouth daily as needed for mild constipation.   Yes [provider]  traMADol  HCl 25 MG TABS Take 25 mg by mouth every 8 (eight) hours as needed (left leg pain).   Yes [provider]  venetoclax  (VENCLEXTA ) 100 MG tablet Take 2 tablets (200 mg total) by mouth daily. Tablets should be swallowed whole with a meal and a full glass of water. 04/23/24 05/27/24 Yes Onesimo Emaline Brink, MD    Allergies: Patient has no known allergies.    Review of Systems  Unable to perform ROS: Mental status change    Updated Vital Signs BP (!) 145/98   Pulse 86   Temp 98.4 F (36.9 C) (Rectal)   Resp 20   SpO2 95%   Physical Exam CONSTITUTIONAL: Elderly, ill-appearing HEAD: Normocephalic/atraumatic EYES: EOMI/PERRL ENMT: Mucous membranes dry NECK: supple no meningeal signs No spinal tenderness CV: S1/S2 noted, tachycardic LUNGS: Lungs are clear to auscultation bilaterally, no apparent distress ABDOMEN: soft, nontender, no rebound or guarding, bowel sounds noted throughout abdomen GU: Patient wearing a diaper on exam.  No overlying erythema or wounds noted to the genitalia or buttocks.  Chaperone present for exam NEURO: Pt is awake/alert but is confused.  She is able to move all 4  extremities but decreased strength on the left from previous stroke.  Patient yells throughout history and exam EXTREMITIES: Distal pulses equal and intact in all 4 extremities The legs are symmetric Pelvis stable.  Diffuse tenderness noted to both thighs but no obvious deformities, no crepitance SKIN: warm PSYCH: Anxious and agitated  (all labs ordered are listed, but only abnormal results are displayed) Labs Reviewed  CBC WITH DIFFERENTIAL/PLATELET - Abnormal; Notable for the following components:      Result Value   Hemoglobin 11.7 (*)    HCT 35.9 (*)    All other components within normal limits  COMPREHENSIVE METABOLIC PANEL WITH GFR - Abnormal; Notable for the following components:   BUN 25 (*)    Calcium  10.9 (*)    All other components within normal  limits  URINALYSIS, W/ REFLEX TO CULTURE (INFECTION SUSPECTED) - Abnormal; Notable for the following components:   APPearance CLOUDY (*)    Hgb urine dipstick SMALL (*)    Protein, ur >=300 (*)    Leukocytes,Ua SMALL (*)    Bacteria, UA MANY (*)    All other components within normal limits  I-STAT CHEM 8, ED - Abnormal; Notable for the following components:   BUN 25 (*)    Glucose, Bld 103 (*)    Hemoglobin 11.9 (*)    HCT 35.0 (*)    All other components within normal limits  TROPONIN T, HIGH SENSITIVITY - Abnormal; Notable for the following components:   Troponin T High Sensitivity 131 (*)    All other components within normal limits  URINE CULTURE  TSH  CK  CBC WITH DIFFERENTIAL/PLATELET  COMPREHENSIVE METABOLIC PANEL WITH GFR  MAGNESIUM   I-STAT CG4 LACTIC ACID, ED    EKG: EKG Interpretation Date/Time:  Tuesday April 30 2024 03:43:28 EST Ventricular Rate:  91 PR Interval:  135 QRS Duration:  73 QT Interval:  358 QTC Calculation: 441 R Axis:   41  Text Interpretation: Sinus rhythm Nonspecific T abnormalities, lateral leads Confirmed by Midge Golas ([id number]) on 04/30/2024 3:47:14 AM  Radiology: [facility]  Femur Portable Min 2 Views Left Result Date: 04/30/2024 EXAM: 2 VIEW(S) XRAY OF THE _LATERALITY_ FEMUR 04/30/2024 01:17:00 AM COMPARISON: None available. CLINICAL HISTORY: pain FINDINGS: BONES AND JOINTS: No acute fracture. No malalignment. SOFT TISSUES: Vascular calcifications. IMPRESSION: 1. No acute findings. Electronically signed by: Franky Stanford MD 04/30/2024 02:56 AM EST RP Workstation: [id number]   DG Chest Portable 1 View Result Date: 04/30/2024 EXAM: 1 VIEW(S) XRAY OF THE CHEST 04/30/2024 01:18:00 AM COMPARISON: 03/01/2024 CLINICAL HISTORY: pain FINDINGS: LUNGS AND PLEURA: Low lung volumes. Stable linear atelectasis or scarring in left mid lung. No pleural effusion. No pneumothorax. HEART AND MEDIASTINUM: Aortic atherosclerosis. No acute abnormality of the cardiac silhouette. BONES AND SOFT TISSUES: No acute osseous abnormality. IMPRESSION: 1. Stable linear atelectasis or scarring in left mid lung. 2. Low lung volumes. Electronically signed by: Franky Stanford MD 04/30/2024 02:56 AM EST RP Workstation: [id number]   DG Femur Portable Min 2 Views Right Result Date: 04/30/2024 EXAM: 2 VIEW(S) XRAY OF THE FEMUR 04/30/2024 01:14:00 AM COMPARISON: None available. CLINICAL HISTORY: pain FINDINGS: BONES AND JOINTS: No acute fracture. No malalignment. SOFT TISSUES: Scattered femoral arterial calcifications. IMPRESSION: 1. No acute findings. Electronically signed by: Franky Stanford MD 04/30/2024 02:55 AM EST RP Workstation: [id number]   CT ABDOMEN PELVIS WO CONTRAST Result Date: 04/29/2024 EXAM: CT ABDOMEN AND PELVIS WITHOUT CONTRAST 04/29/2024 07:26:02 PM TECHNIQUE: CT of the abdomen and pelvis was performed without the administration of intravenous contrast. Multiplanar reformatted images are provided for review. Automated exposure control, iterative reconstruction, and/or weight-based adjustment of the mA/kV was utilized to reduce the radiation dose to as low as reasonably achievable. COMPARISON: Findings  are compared to 10/04/2022. CLINICAL HISTORY: Abdominal pain, acute, nonlocalized. Chronic lymphocytic leukemia. Tracking code: Bo. FINDINGS: LOWER CHEST: No acute abnormality. LIVER: The liver is unremarkable. GALLBLADDER AND BILE DUCTS: Gallbladder is unremarkable. No biliary ductal dilatation. SPLEEN: No acute abnormality. PANCREAS: No acute abnormality. ADRENAL GLANDS: No acute abnormality. KIDNEYS, URETERS AND BLADDER: Right kidney: Simple cortical cyst noted within the lower pole of the right kidney for which no follow up imaging is recommended. Left kidney: Left lower pole exophytic low attenuation lesion was previously characterized as a simple cyst and demonstrates interval decrease in size in keeping  with an involuting cyst. Again no follow up imaging is recommended for this lesion. Per consensus, no follow-up is needed for simple Bosniak type 1 and 2 renal cysts, unless the patient has a malignancy history or risk factors. Mild nonspecific bilateral perinephric stranding is unchanged. No stones in the kidneys or ureters. No hydronephrosis. No periureteral stranding. Urinary bladder: Gas within the bladder lumen is nonspecific, possibly related to recent catheterization. Urosepsis. GI AND BOWEL: Stomach demonstrates no acute abnormality. Appendix normal. The small bowel and large bowel are otherwise unremarkable. There is no bowel obstruction. PERITONEUM AND RETROPERITONEUM: No ascites. No free air. VASCULATURE: Aorta is normal in caliber. No aortic aneurysm. Mild aortoiliac atherosclerotic calcification. LYMPH NODES: Previously noted pathologic retroperitoneal and pelvic adenopathy has resolved. No pathologic residual adenopathy is seen within the abdomen and pelvis. REPRODUCTIVE ORGANS: No adnexal masses. BONES AND SOFT TISSUES: The osseous structures are diffusely osteopenic, progressive since prior examination. Tiny fat-containing umbilical hernia. IMPRESSION: 1. No acute findings in the abdomen or  pelvis to explain abdominal pain. 2. Resolution of previously noted pathologic retroperitoneal and pelvic adenopathy, with no pathologic residual adenopathy in the abdomen or pelvis. 3. Gas within the bladder lumen, possibly related to recent catheterization. Correlation with clinical history is recommended. 4. Diffuse osteopenia, progressive since prior examination Electronically signed by: Dorethia Molt MD 04/29/2024 09:54 PM EST RP Workstation: [id number]   CT Head Wo Contrast Result Date: 04/29/2024 EXAM: CT HEAD WITHOUT CONTRAST 04/29/2024 07:26:02 PM TECHNIQUE: CT of the head was performed without the administration of intravenous contrast. Automated exposure control, iterative reconstruction, and/or weight based adjustment of the mA/kV was utilized to reduce the radiation dose to as low as reasonably achievable. COMPARISON: 03/01/2024 CLINICAL HISTORY: Mental status change, unknown cause FINDINGS: BRAIN AND VENTRICLES: No acute hemorrhage. No evidence of acute infarct. No hydrocephalus. No extra-axial collection. No mass effect or midline shift. Moderate vascular calcifications within the carotid siphons. ORBITS: No acute abnormality. SINUSES: No acute abnormality. SOFT TISSUES AND SKULL: No acute soft tissue abnormality. No skull fracture. IMPRESSION: 1. No acute intracranial abnormality. Electronically signed by: Franky Stanford MD 04/29/2024 08:34 PM EST RP Workstation: [id number]     .Critical Care  Performed by: Midge Golas, MD Authorized by: Midge Golas, MD   Critical care provider statement:    Critical care time (minutes):  120   Critical care start time:  04/29/2024 11:30 PM   Critical care end time:  04/30/2024 1:30 AM   Critical care time was exclusive of:  Separately billable procedures and treating other patients   Critical care was necessary to treat or prevent imminent or life-threatening deterioration of the following conditions:  CNS failure or compromise, dehydration  and metabolic crisis   Critical care was time spent personally by me on the following activities:  Obtaining history from patient or surrogate, examination of patient, review of old charts, re-evaluation of patient's condition, pulse oximetry, ordering and review of radiographic studies, ordering and review of laboratory studies, ordering and performing treatments and interventions, development of treatment plan with patient or surrogate and evaluation of patient's response to treatment   I assumed direction of critical care for this patient from another provider in my specialty: no     Care discussed with: admitting provider      Medications Ordered in the ED  acetaminophen  (TYLENOL ) tablet 650 mg (has no administration in time range)    Or  acetaminophen  (TYLENOL ) suppository 650 mg (has no administration in time range)  melatonin tablet 3  mg (has no administration in time range)  ondansetron  (ZOFRAN ) injection 4 mg (has no administration in time range)  LORazepam  (ATIVAN ) injection 1 mg (has no administration in time range)  cefTRIAXone  (ROCEPHIN ) 1 g in sodium chloride  0.9 % 100 mL IVPB (has no administration in time range)  insulin  aspart (novoLOG ) injection 0-9 Units (has no administration in time range)  sodium chloride  0.9 % bolus 500 mL (0 mLs Intravenous Stopped 04/29/24 2050)  QUEtiapine  (SEROQUEL ) tablet 25 mg (25 mg Oral Given 04/29/24 2110)  fentaNYL  (SUBLIMAZE ) injection 50 mcg (50 mcg Intravenous Given 04/30/24 0028)  sodium chloride  0.9 % bolus 500 mL (0 mLs Intravenous Stopped 04/30/24 0059)  LORazepam  (ATIVAN ) injection 1 mg (1 mg Intravenous Given 04/30/24 0117)  cefTRIAXone  (ROCEPHIN ) 1 g in sodium chloride  0.9 % 100 mL IVPB (0 g Intravenous Stopped 04/30/24 0244)    Clinical Course as of 04/30/24 0437  Tue Apr 30, 2024  0020 Patient with previous history of CVA presents with pain in her legs and altered mental status.  Initial workup was overall unrevealing.  Rectal  temp was around 99.  She still reports pain in both of her legs but no obvious deformity Will add on labs and imaging.  Patient does have a previous history of hyperthyroid per previous records I spoke to her sister via phone, she reports over the past day she has had increasing confusion and leg pain.  However she has had episodes previously causing AMS [DW]  0144 Vitals improved with Ativan , agitation improved [DW]  0436 Patient is continue to improve.  Vitals are improved.  She is somnolent but easily arousable and can follow commands.  Extensive workup was revealed a UTI.  Suspicion this is triggering a metabolic encephalopathy.  No acute findings were noted on workup for her extremity pain.  Her legs are well-perfused and symmetric.  No signs of cellulitis or fasciitis.  X-rays were negative.  Patient did have an elevation in her troponin though this is could be related to her persistent tachycardia but in her earlier state. She had no EKG changes [DW]  0437 Discussed with Dr. Marcene for admission  [DW]    Clinical Course User Index [DW] Midge Golas, MD                                 Medical Decision Making Amount and/or Complexity of Data Reviewed Labs: ordered. Radiology: ordered. ECG/medicine tests: ordered.  Risk Prescription drug management. Decision regarding hospitalization.   This patient presents to the ED for concern of altered mental status, this involves an extensive number of treatment options, and is a complaint that carries with it a high risk of complications and morbidity.  The differential diagnosis includes but is not limited to CVA, intracranial hemorrhage, acute coronary syndrome, renal failure, urinary tract infection, electrolyte disturbance, pneumonia   Comorbidities that complicate the patient evaluation: Patients presentation is complicated by their history of previous stroke  Social Determinants of Health: Patients poor mobility   increases the complexity of managing their presentation  Additional history obtained: Additional history obtained from family Records reviewed previous admission documents  Lab Tests: I Ordered, and personally interpreted labs.  The pertinent results include: Mild anemia and dehydration  Imaging Studies ordered: I ordered imaging studies including CT scan head  I independently visualized and interpreted imaging which showed no acute findings I agree with the radiologist interpretation  Cardiac Monitoring: The patient  was maintained on a cardiac monitor.  I personally viewed and interpreted the cardiac monitor which showed an underlying rhythm of:  sinus tachycardia  Medicines ordered and prescription drug management: I ordered medication including IV fluids for tachycardia Fentanyl  for pain Reevaluation of the patient after these medicines showed that the patient    improved   Critical Interventions:   IV antibiotics  Consultations Obtained: I requested consultation with the admitting physician [facility], and discussed  findings as well as pertinent plan - they recommend: Will admit  Reevaluation: After the interventions noted above, I reevaluated the patient and found that they have :improved  Complexity of problems addressed: Patients presentation is most consistent with  acute presentation with potential threat to life or bodily function  Disposition: After consideration of the diagnostic results and the patients response to treatment,  I feel that the patent would benefit from admission  .        Final diagnoses:  Acute cystitis without hematuria  Metabolic encephalopathy    ED Discharge Orders     None          Midge Golas, MD 04/30/24 [id number]  "

## 2024-04-30 NOTE — ED Notes (Signed)
 Pt continuing to yell out and is inconsolable despite pain medication administration. MD made aware

## 2024-04-30 NOTE — Progress Notes (Signed)
 Patient transported to MRI

## 2024-05-01 ENCOUNTER — Other Ambulatory Visit: Payer: Self-pay

## 2024-05-01 ENCOUNTER — Observation Stay (HOSPITAL_COMMUNITY)

## 2024-05-01 ENCOUNTER — Observation Stay (HOSPITAL_BASED_OUTPATIENT_CLINIC_OR_DEPARTMENT_OTHER)

## 2024-05-01 ENCOUNTER — Encounter (HOSPITAL_COMMUNITY): Payer: Self-pay | Admitting: Internal Medicine

## 2024-05-01 DIAGNOSIS — I10 Essential (primary) hypertension: Secondary | ICD-10-CM | POA: Diagnosis present

## 2024-05-01 DIAGNOSIS — I7090 Unspecified atherosclerosis: Secondary | ICD-10-CM | POA: Diagnosis not present

## 2024-05-01 DIAGNOSIS — Z7982 Long term (current) use of aspirin: Secondary | ICD-10-CM | POA: Diagnosis not present

## 2024-05-01 DIAGNOSIS — N3000 Acute cystitis without hematuria: Secondary | ICD-10-CM

## 2024-05-01 DIAGNOSIS — M79661 Pain in right lower leg: Secondary | ICD-10-CM

## 2024-05-01 DIAGNOSIS — C8300 Small cell B-cell lymphoma, unspecified site: Secondary | ICD-10-CM | POA: Diagnosis not present

## 2024-05-01 DIAGNOSIS — L89152 Pressure ulcer of sacral region, stage 2: Secondary | ICD-10-CM | POA: Diagnosis present

## 2024-05-01 DIAGNOSIS — I160 Hypertensive urgency: Secondary | ICD-10-CM | POA: Diagnosis present

## 2024-05-01 DIAGNOSIS — I69354 Hemiplegia and hemiparesis following cerebral infarction affecting left non-dominant side: Secondary | ICD-10-CM | POA: Diagnosis not present

## 2024-05-01 DIAGNOSIS — B9620 Unspecified Escherichia coli [E. coli] as the cause of diseases classified elsewhere: Secondary | ICD-10-CM | POA: Diagnosis present

## 2024-05-01 DIAGNOSIS — Z7984 Long term (current) use of oral hypoglycemic drugs: Secondary | ICD-10-CM | POA: Diagnosis not present

## 2024-05-01 DIAGNOSIS — I471 Supraventricular tachycardia, unspecified: Secondary | ICD-10-CM | POA: Diagnosis not present

## 2024-05-01 DIAGNOSIS — F32A Depression, unspecified: Secondary | ICD-10-CM | POA: Diagnosis present

## 2024-05-01 DIAGNOSIS — E1169 Type 2 diabetes mellitus with other specified complication: Secondary | ICD-10-CM | POA: Diagnosis not present

## 2024-05-01 DIAGNOSIS — K219 Gastro-esophageal reflux disease without esophagitis: Secondary | ICD-10-CM | POA: Diagnosis present

## 2024-05-01 DIAGNOSIS — M79604 Pain in right leg: Secondary | ICD-10-CM | POA: Diagnosis present

## 2024-05-01 DIAGNOSIS — Z8249 Family history of ischemic heart disease and other diseases of the circulatory system: Secondary | ICD-10-CM | POA: Diagnosis not present

## 2024-05-01 DIAGNOSIS — Z823 Family history of stroke: Secondary | ICD-10-CM | POA: Diagnosis not present

## 2024-05-01 DIAGNOSIS — M549 Dorsalgia, unspecified: Secondary | ICD-10-CM | POA: Diagnosis present

## 2024-05-01 DIAGNOSIS — E785 Hyperlipidemia, unspecified: Secondary | ICD-10-CM | POA: Diagnosis present

## 2024-05-01 DIAGNOSIS — E1140 Type 2 diabetes mellitus with diabetic neuropathy, unspecified: Secondary | ICD-10-CM | POA: Diagnosis present

## 2024-05-01 DIAGNOSIS — D849 Immunodeficiency, unspecified: Secondary | ICD-10-CM | POA: Diagnosis present

## 2024-05-01 DIAGNOSIS — Z833 Family history of diabetes mellitus: Secondary | ICD-10-CM | POA: Diagnosis not present

## 2024-05-01 DIAGNOSIS — I5030 Unspecified diastolic (congestive) heart failure: Secondary | ICD-10-CM | POA: Diagnosis not present

## 2024-05-01 DIAGNOSIS — G9341 Metabolic encephalopathy: Secondary | ICD-10-CM | POA: Diagnosis present

## 2024-05-01 DIAGNOSIS — R5381 Other malaise: Secondary | ICD-10-CM | POA: Diagnosis present

## 2024-05-01 DIAGNOSIS — E039 Hypothyroidism, unspecified: Secondary | ICD-10-CM | POA: Diagnosis present

## 2024-05-01 DIAGNOSIS — C8590 Non-Hodgkin lymphoma, unspecified, unspecified site: Secondary | ICD-10-CM | POA: Diagnosis present

## 2024-05-01 DIAGNOSIS — M79605 Pain in left leg: Secondary | ICD-10-CM | POA: Diagnosis present

## 2024-05-01 LAB — COMPREHENSIVE METABOLIC PANEL WITH GFR
ALT: 24 U/L (ref 0–44)
AST: 29 U/L (ref 15–41)
Albumin: 3.7 g/dL (ref 3.5–5.0)
Alkaline Phosphatase: 79 U/L (ref 38–126)
Anion gap: 16 — ABNORMAL HIGH (ref 5–15)
BUN: 21 mg/dL (ref 8–23)
CO2: 22 mmol/L (ref 22–32)
Calcium: 10.4 mg/dL — ABNORMAL HIGH (ref 8.9–10.3)
Chloride: 105 mmol/L (ref 98–111)
Creatinine, Ser: 0.94 mg/dL (ref 0.44–1.00)
GFR, Estimated: >60 mL/min
Glucose, Bld: 98 mg/dL (ref 70–99)
Potassium: 3.7 mmol/L (ref 3.5–5.1)
Sodium: 142 mmol/L (ref 135–145)
Total Bilirubin: 0.4 mg/dL (ref 0.0–1.2)
Total Protein: 7.4 g/dL (ref 6.5–8.1)

## 2024-05-01 LAB — ECHOCARDIOGRAM COMPLETE BUBBLE STUDY
AR max vel: 2.44 cm2
AV Peak grad: 6.4 mmHg
Ao pk vel: 1.26 m/s
Area-P 1/2: 6.32 cm2
Est EF: >75
S' Lateral: 2.3 cm

## 2024-05-01 LAB — GLUCOSE, CAPILLARY
Glucose-Capillary: 120 mg/dL — ABNORMAL HIGH (ref 70–99)
Glucose-Capillary: 247 mg/dL — ABNORMAL HIGH (ref 70–99)
Glucose-Capillary: 155 mg/dL — ABNORMAL HIGH (ref 70–99)
Glucose-Capillary: 131 mg/dL — ABNORMAL HIGH (ref 70–99)

## 2024-05-01 LAB — CBC
HCT: 34.1 % — ABNORMAL LOW (ref 36.0–46.0)
Hemoglobin: 11.2 g/dL — ABNORMAL LOW (ref 12.0–15.0)
MCH: 27.1 pg (ref 26.0–34.0)
MCHC: 32.8 g/dL (ref 30.0–36.0)
MCV: 82.6 fL (ref 80.0–100.0)
Platelets: 238 K/uL (ref 150–400)
RBC: 4.13 MIL/uL (ref 3.87–5.11)
RDW: 16.1 % — ABNORMAL HIGH (ref 11.5–15.5)
WBC: 6 K/uL (ref 4.0–10.5)
nRBC: 0 % (ref 0.0–0.2)

## 2024-05-01 LAB — LIPID PANEL
Cholesterol: 138 mg/dL (ref 0–200)
HDL: 51 mg/dL
LDL Cholesterol: 70 mg/dL (ref 0–99)
Total CHOL/HDL Ratio: 2.7 ratio
Triglycerides: 87 mg/dL
VLDL: 17 mg/dL (ref 0–40)

## 2024-05-01 LAB — MAGNESIUM: Magnesium: 1.8 mg/dL (ref 1.7–2.4)

## 2024-05-01 MED ORDER — PERFLUTREN LIPID MICROSPHERE
1.0000 mL | INTRAVENOUS | Status: AC | PRN
  Administered 2024-05-01: 5 mL via INTRAVENOUS

## 2024-05-01 MED ORDER — VENETOCLAX 100 MG PO TABS
200.0000 mg | ORAL_TABLET | Freq: Every day | ORAL | Status: DC
  Administered 2024-05-02 – 2024-05-03 (×2): 200 mg via ORAL
  Filled 2024-05-01 (×6): qty 2

## 2024-05-01 MED ORDER — LABETALOL HCL 5 MG/ML IV SOLN
10.0000 mg | INTRAVENOUS | Status: DC | PRN
  Administered 2024-05-01: 10 mg via INTRAVENOUS
  Filled 2024-05-01: qty 4

## 2024-05-01 MED ORDER — CARVEDILOL 6.25 MG PO TABS
6.2500 mg | ORAL_TABLET | Freq: Two times a day (BID) | ORAL | Status: DC
  Administered 2024-05-01 (×2): 6.25 mg via ORAL
  Filled 2024-05-01 (×2): qty 1

## 2024-05-01 NOTE — Evaluation (Signed)
 Physical Therapy Evaluation and DISCHARGE Patient Details Name: [name] MRN: [id number] DOB: [birth year] Today's Date: 05/01/2024  History of Present Illness  Pt is a 63 y/o F who presented to [facility] 04/29/24 by EMS for c/o back pain and elevated BP. Imaging unremarkable for acute findings. Pt recently hospitalized 10/31 for AMS and hallucinations. PMHx: CVA with residual L hemiparesis, DMII, HTN, HLD, UTI, depression, Non-Hodgkin's lymphoma, Guillain-Barre (1988, fully recovered).   Clinical Impression  Pt is currently mobilizing at her baseline, though experiencing increased levels of pain. Pt resides at SNF and is hoyer at baseline, working with PT/OT as able for UE and LE exercises. Pt requires max-totalAx2 for bed mobility and has difficulty tolerating sitting EOB but is able to maintain for ~5 minutes with max-totalA for trunk support. Pt is dependent at baseline for transfers and does not propel wheelchair herself. Pt unable to participate in productive mobility at this time due to levels of pain. PT to sign off. Re-consult as needed if mobility needs change prior to return to facility.      If plan is discharge home, recommend the following: Two people to help with walking and/or transfers;Two people to help with bathing/dressing/bathroom;Assistance with cooking/housework;Assistance with feeding;Direct supervision/assist for medications management;Direct supervision/assist for financial management;Assist for transportation;Help with stairs or ramp for entrance;Supervision due to cognitive status   Can travel by private vehicle   No    Equipment Recommendations None recommended by PT (Pt has appropriate equipment at facility)  Recommendations for Other Services       Functional Status Assessment Patient has not had a recent decline in their functional status     Precautions / Restrictions Precautions Precautions: Fall Recall of Precautions/Restrictions:  Impaired Restrictions Weight Bearing Restrictions Per Provider Order: No      Mobility  Bed Mobility Overal bed mobility: Needs Assistance Bed Mobility: Supine to Sit, Sit to Supine Rolling: +2 for physical assistance, Max assist, Total assist (MaxAx2 when rolling to the L, pt able to help pull via hand held assist. TotalAx2 to roll to the R)   Supine to sit: Total assist, +2 for physical assistance, +2 for safety/equipment Sit to supine: Total assist, +2 for physical assistance, +2 for safety/equipment   General bed mobility comments: exited to the L side, heavy assist +2, significant difficulty tolerating due to pain. Max-totalA for trunk control EOB.    Transfers                   General transfer comment: Pt dependent hoyer for transfers at baseline.    Ambulation/Gait                  Stairs            Wheelchair Mobility     Tilt Bed    Modified Rankin (Stroke Patients Only)       Balance Overall balance assessment: Needs assistance Sitting-balance support: Single extremity supported, Feet supported Sitting balance-Leahy Scale: Zero Sitting balance - Comments: total A to maintain, heavy posterior lean at EOB, L UE does not contribute to seated balance due to hemiparesis and contracture. Attempts to pull self forward with R UE but is unable to maintain position. Postural control: Posterior lean                                   Pertinent Vitals/Pain Pain Assessment Pain Assessment: Faces Faces Pain Scale: Hurts worst Pain  Location: Global Pain Descriptors / Indicators: Aching, Crying, Grimacing, Moaning, Sharp, Sore Pain Intervention(s): Limited activity within patient's tolerance, Repositioned, Monitored during session    Home Living Family/patient expects to be discharged to:: Skilled nursing facility     Type of Home: Other(Comment)             Additional Comments: Per chart review from last admission, pt from  [facility].    Prior Function Prior Level of Function : Needs assist  Cognitive Assist : Mobility (cognitive);ADLs (cognitive) Mobility (Cognitive): Step by step cues ADLs (Cognitive): Step by step cues   Mobility (physical): Bed mobility;Transfers ADLs (physical): Bathing;Dressing;Toileting;IADLs Mobility Comments: Pt hoyer lift at baseline, is able to groom/feed self. Was working with PT/OT at facility on UE strengthening. ADLs Comments: Dependent for dressing, bathing, grooming.     Extremity/Trunk Assessment   Upper Extremity Assessment Upper Extremity Assessment: Defer to OT evaluation LUE Deficits / Details: L hemiparesis from prior CVA LUE Sensation: WNL LUE Coordination: decreased fine motor;decreased gross motor    Lower Extremity Assessment Lower Extremity Assessment: RLE deficits/detail;LLE deficits/detail RLE Deficits / Details: Hypersensitive to touch, especially foot. Difficulty demonstrating active ROM due to pain. RLE: Unable to fully assess due to pain RLE Coordination: decreased gross motor;decreased fine motor LLE Deficits / Details: No movement detected when cued in bed or sitting EOB. L side affected from prior CVA. Hypersensitive to touch. Skin appears tight and shiny. LLE: Unable to fully assess due to pain LLE Coordination: decreased fine motor;decreased gross motor    Cervical / Trunk Assessment Cervical / Trunk Assessment: Kyphotic Cervical / Trunk Exceptions: Difficult to determine due to maxA-totalA required for trunk support upon sitting EOB.  Communication   Communication Communication: Impaired Factors Affecting Communication: Difficulty expressing self;Reduced clarity of speech    Cognition Arousal: Alert Behavior During Therapy: WFL for tasks assessed/performed   PT - Cognitive impairments: Problem solving, Safety/Judgement, History of cognitive impairments, Awareness                       PT - Cognition Comments: Pt  hyperfixated on flesh eating things stating they were crawling through my body. Pt breifly redirected. Following commands: Impaired Following commands impaired: Follows one step commands inconsistently     Cueing Cueing Techniques: Verbal cues, Tactile cues, Visual cues     General Comments General comments (skin integrity, edema, etc.): VSS throughout. No significant skin abnormalities noted.    Exercises     Assessment/Plan    PT Assessment All further PT needs can be met in the next venue of care  PT Problem List Decreased strength;Decreased mobility;Decreased safety awareness;Impaired tone;Decreased range of motion;Decreased coordination;Decreased knowledge of precautions;Decreased activity tolerance;Decreased cognition;Decreased skin integrity;Decreased balance;Decreased knowledge of use of DME;Pain;Impaired sensation       PT Treatment Interventions      PT Goals (Current goals can be found in the Care Plan section)  Acute Rehab PT Goals Patient Stated Goal: None stated    Frequency  (Discharge)     Co-evaluation PT/OT/SLP Co-Evaluation/Treatment: Yes Reason for Co-Treatment: Complexity of the patient's impairments (multi-system involvement);For patient/therapist safety;To address functional/ADL transfers PT goals addressed during session: Mobility/safety with mobility;Balance OT goals addressed during session: ADL's and self-care       AM-PAC PT 6 Clicks Mobility  Outcome Measure Help needed turning from your back to your side while in a flat bed without using bedrails?: Total Help needed moving from lying on your back to sitting on  the side of a flat bed without using bedrails?: Total Help needed moving to and from a bed to a chair (including a wheelchair)?: Total Help needed standing up from a chair using your arms (e.g., wheelchair or bedside chair)?: Total Help needed to walk in hospital room?: Total Help needed climbing 3-5 steps with a railing? :  Total 6 Click Score: 6    End of Session   Activity Tolerance: Patient limited by pain Patient left: in bed;with call bell/phone within reach;with bed alarm set Nurse Communication: Mobility status;Need for lift equipment PT Visit Diagnosis: Other abnormalities of gait and mobility (R26.89);Pain Pain - Right/Left:  (Global)    Time: 8664-8640 PT Time Calculation (min) (ACUTE ONLY): 24 min   Charges:   PT Evaluation $PT Eval High Complexity: 1 High PT Treatments $Therapeutic Activity: 8-22 mins PT General Charges $$ ACUTE PT VISIT: 1 Visit         Sabra Morel, PT, DPT  Acute Rehabilitation Services         Office: [phone]     Sabra MARLA Morel 05/01/2024, 3:35 PM

## 2024-05-01 NOTE — Evaluation (Signed)
 Occupational Therapy Evaluation Patient Details Name: Natisha Trzcinski MRN: [id number] DOB: 1960/12/12 Today's Date: 05/01/2024   History of Present Illness   Pt is a 63 y/o F who presented to [facility] 04/29/24 by EMS for c/o back pain and elevated BP. Imaging unremarkable for acute findings. Pt recently hospitalized 10/31 for AMS and hallucinations. PMHx: CVA with residual L hemiparesis, DMII, HTN, HLD, UTI, depression, Non-Hodgkin's lymphoma, Guillain-Barre (1988, fully recovered).     Clinical Impressions Pt seen for OT evaluation this PM. Per pt and previous notes from recent admission, pt hoyer lift dependent at facility, has assist for ADLs (except for feeding/grooming). She presents today near her baseline - L hemiparesis and impaired speech quality. Total A +2 for supine<>sit and to maintain sitting. Setup for feeding/simple UB grooming bed level. VSS. Given pt is near her baseline, recommend return to facility upon d/c, OT to sign-off.     If plan is discharge home, recommend the following:   Two people to help with walking and/or transfers;Two people to help with bathing/dressing/bathroom;Assistance with cooking/housework;Assistance with feeding;Direct supervision/assist for medications management;Direct supervision/assist for financial management;Assist for transportation;Help with stairs or ramp for entrance;Supervision due to cognitive status     Functional Status Assessment         Equipment Recommendations   None recommended by OT     Recommendations for Other Services         Precautions/Restrictions   Precautions Precautions: Fall Recall of Precautions/Restrictions: Impaired Restrictions Weight Bearing Restrictions Per Provider Order: No     Mobility Bed Mobility Overal bed mobility: Needs Assistance Bed Mobility: Supine to Sit, Sit to Supine     Supine to sit: Total assist, +2 for physical assistance, +2 for safety/equipment Sit to supine: Total  assist, +2 for physical assistance, +2 for safety/equipment   General bed mobility comments: exited to the L side, heavy assist +2    Transfers                   General transfer comment: deferred seeing as pt is lift dependent at baseline      Balance Overall balance assessment: Needs assistance Sitting-balance support: Single extremity supported, Feet supported Sitting balance-Leahy Scale: Zero Sitting balance - Comments: total A to maintain, heavy posterior lean at EOB                                   ADL either performed or assessed with clinical judgement   ADL Overall ADL's : At baseline                                             Vision Baseline Vision/History: 0 No visual deficits Ability to See in Adequate Light: 0 Adequate Patient Visual Report: No change from baseline       Perception         Praxis         Pertinent Vitals/Pain Pain Assessment Pain Assessment: Faces Faces Pain Scale: Hurts even more Pain Location: R LE/groin with movement Pain Descriptors / Indicators: Discomfort, Grimacing Pain Intervention(s): Limited activity within patient's tolerance, Monitored during session     Extremity/Trunk Assessment Upper Extremity Assessment Upper Extremity Assessment: LUE deficits/detail LUE Deficits / Details: L hemiparesis from prior CVA LUE Sensation: WNL LUE Coordination: decreased fine motor;decreased gross motor  Lower Extremity Assessment Lower Extremity Assessment: Defer to PT evaluation (LHB paresis)   Cervical / Trunk Assessment Cervical / Trunk Assessment: Kyphotic;Other exceptions Cervical / Trunk Exceptions: R lateral flexion preference   Communication Communication Communication: Impaired Factors Affecting Communication: Reduced clarity of speech   Cognition Arousal: Alert Behavior During Therapy: WFL for tasks assessed/performed Cognition: History of cognitive impairments              OT - Cognition Comments: pt perseverative on the flesh eating disease in my leg                 Following commands: Impaired Following commands impaired: Follows one step commands with increased time     Cueing  General Comments   Cueing Techniques: Verbal cues;Gestural cues  VSS; pt requiring frequent redirection; difficulty understanding speech at times   Exercises     Shoulder Instructions      Home Living Family/patient expects to be discharged to:: Skilled nursing facility     Type of Home: Other(Comment)                           Additional Comments: Per chart review from last admission, pt from [facility].      Prior Functioning/Environment Prior Level of Function : Needs assist  Cognitive Assist : Mobility (cognitive);ADLs (cognitive) Mobility (Cognitive): Step by step cues ADLs (Cognitive): Step by step cues   Mobility (physical): Bed mobility;Transfers ADLs (physical): Bathing;Dressing;Toileting;IADLs Mobility Comments: Pt hoyer lift at baseline, is able to groom/feed self. Was working with PT/OT at facility on UE strengthening. ADLs Comments: Dependent for dressing, bathing, grooming.    OT Problem List: Decreased strength;Decreased activity tolerance;Impaired balance (sitting and/or standing);Decreased cognition;Decreased safety awareness;Impaired UE functional use;Pain;Impaired vision/perception   OT Treatment/Interventions:        OT Goals(Current goals can be found in the care plan section)   Acute Rehab OT Goals Patient Stated Goal: get better and speak to a chaplain   OT Frequency:       Co-evaluation PT/OT/SLP Co-Evaluation/Treatment: Yes Reason for Co-Treatment: Complexity of the patient's impairments (multi-system involvement);For patient/therapist safety;To address functional/ADL transfers PT goals addressed during session: Mobility/safety with mobility;Balance OT goals addressed during session: ADL's and  self-care      AM-PAC OT 6 Clicks Daily Activity     Outcome Measure Help from another person eating meals?: A Little Help from another person taking care of personal grooming?: A Little Help from another person toileting, which includes using toliet, bedpan, or urinal?: Total Help from another person bathing (including washing, rinsing, drying)?: Total Help from another person to put on and taking off regular upper body clothing?: Total Help from another person to put on and taking off regular lower body clothing?: Total 6 Click Score: 10   End of Session Nurse Communication: Other (comment) (pt status)  Activity Tolerance: Patient tolerated treatment well Patient left: in bed;with call bell/phone within reach;with bed alarm set  OT Visit Diagnosis: Muscle weakness (generalized) (M62.81)                Time: 8663-8640 OT Time Calculation (min): 23 min Charges:  OT General Charges $OT Visit: 1 Visit OT Evaluation $OT Eval Low Complexity: 1 Low  Kipling Graser M. [place], OTR/L [facility] Acute Rehabilitation Services [phone] Secure Chat Preferred  Latha Staunton 05/01/2024, 3:22 PM

## 2024-05-01 NOTE — Progress Notes (Signed)
 Venous duplex lower ext  has been completed. Refer to [facility] under chart review to view preliminary results.   05/01/2024  2:49 PM [name], [name]

## 2024-05-01 NOTE — Progress Notes (Signed)
 This chaplain responded to the unit's consult for Pt. prayer.    The chaplain listened reflectively to the Pt. describe the physical pain in her abdomen of a 10 and spiritual distress associated with the pain. The chaplain updated the Pt. RN. The Chg. RN responded with pain medicine and the Pt. declined.  The Pt. sister and [employer] employee arrived at the end of the visit and was updated on the chaplain's presence with the Pt. and prayer.  Spiritual Care is available as needed.  Chaplain Leeroy Hummer [phone]

## 2024-05-01 NOTE — Assessment & Plan Note (Addendum)
 Man resume home venetoclax  200 mg daily when able to resume p.o. safely Continue follow-up as outpatient with Dr. Onesimo.  Patient is scheduled for 05/22/2024

## 2024-05-01 NOTE — Progress Notes (Signed)
 "                        PROGRESS NOTE        PATIENT DETAILS Name: [name] Age: 63 y.o. Sex: female Date of Birth: [birth year] Admit Date: 04/29/2024 Admitting Physician Eva KATHEE Pore, DO ERE:[email or web address], Silvano, DO  Brief Summary: Patient is a 63 y.o.  female with history of CVA-left-sided residual hemiparesis, HTN, HLD, DM-2, non-Hodgkin's lymphoma-who was brought to the ED from SNF for altered mental status/B/L lower extremity pain.  Significant events: 12/30>> admit to [facility]  Significant studies: 12/29>> CT abdomen/pelvis: No acute abnormalities. 12/29>> CT head: No acute abnormality 12/30>> right femur x-ray: No acute findings 12/30>> left femur x-ray: No acute findings 12/30>> chest x-ray: No PNA 12/30>> CTA chest: No PE 12/30>> MRI brain: No acute intracranial abnormality  Significant microbiology data: 12/30>> urine culture: Pending  Procedures: None  Consults: None  Subjective: Awake-seems to be better than what was described in the ED documentation-not sure if she is back to her baseline-still needs some redirection.  Blood pressure significantly elevated.  Objective: Vitals: Blood pressure (!) 142/92, pulse (!) 103, temperature 98.3 F (36.8 C), temperature source Oral, resp. rate (!) 25, weight 68.1 kg, SpO2 95%.   Exam: Gen Exam: Chronically sick appearing-not in any distress. HEENT:atraumatic, normocephalic Chest: B/L clear to auscultation anteriorly CVS:S1S2 regular Abdomen:soft non tender, non distended Extremities:no edema Neurology: Left hemiplegia/hemiparesis. Skin: no rash  Pertinent Labs/Radiology:    Latest Ref Rng & Units 05/01/2024    3:36 AM 04/30/2024    7:58 AM 04/29/2024    7:09 PM  CBC  WBC 4.0 - 10.5 K/uL 6.0  4.4    Hemoglobin 12.0 - 15.0 g/dL 88.7  89.2  88.0   Hematocrit 36.0 - 46.0 % 34.1  32.3  35.0   Platelets 150 - 400 K/uL 238  215      Lab Results  Component Value Date   NA 142 05/01/2024   K 3.7  05/01/2024   CL 105 05/01/2024   CO2 22 05/01/2024      Assessment/Plan: Acute metabolic encephalopathy Thought to be secondary to complicated UTI Improved-not sure if she is back to her baseline-still needs some redirection-no family at bedside Continue IV Rocephin -follow cultures.  Complicated UTI See above Note-patient immunocompromised-on chemotherapy for small lymphocytic lymphoma  B/L lower extremity pain Do not see any wounds This could be related to neuropathy  Once mentation-improves further-May need to be started on low-dose Neurontin .  Hypertensive urgency BP significantly elevated-continue HCTZ/clonidine/amlodipine -Will add beta-blocker.  Continue as needed IV hydralazine -add as needed IV labetalol .  History of CVA with left hemiparesis At baseline MRI brain negative for acute CVA Continue aspirin /statin/Zetia   DM-2 (A1c 7.6 on November 2025) CBG stable Continue SSI Resume oral hypoglycemic agents on discharge.  Recent Labs    04/30/24 1625 04/30/24 2127 05/01/24 0922  GLUCAP 114* 89 120*     History of hypothyroidism TSH/free T4 stable-no longer on methimazole  (per pharmacy-methimazole  last filled October/November)   GERD PPI  Mood disorder Prozac /BuSpar   Small lymphocytic lymphoma Follows with ecology-Dr Onesimo Resume venetoclax  (Pharmacy trying to get it from SNF)  Heterogeneous enlarged right thyroid  gland Incidental finding on CT chest-stable for outpatient follow-up with PCP for nonemergent thyroid  ultrasound.  Chronic debility/deconditioning Unclear functional status-poor historian-appears to have significant left-sided hemiplegia from prior CVA. SNF resident PT/OT eval pending.  Pressure Ulcer: Agree with assessment and plan as outlined below. Wound 03/01/24 2030  Pressure Injury Coccyx Medial Stage 2 -  Partial thickness loss of dermis presenting as a shallow open injury with a red, pink wound bed without slough. (Active)    Code  status:   Code Status: Full Code   DVT Prophylaxis: heparin  injection 5,000 Units Start: 04/30/24 2200 SCD's Start: 04/30/24 1814 SCDs Start: 04/30/24 0433   Family Communication: None at bedside   Disposition Plan: Status is: Observation The patient will require care spanning > 2 midnights and should be moved to inpatient because: Severity of illness   Planned Discharge Destination:Skilled nursing facility   Diet: Diet Order             Diet regular Fluid consistency: Thin  Diet effective now                     Antimicrobial agents: Anti-infectives (From admission, onward)    Start     Dose/Rate Route Frequency Ordered Stop   04/30/24 2200  cefTRIAXone  (ROCEPHIN ) 1 g in sodium chloride  0.9 % 100 mL IVPB        1 g 200 mL/hr over 30 Minutes Intravenous Every 24 hours 04/30/24 0436     04/30/24 0215  cefTRIAXone  (ROCEPHIN ) 1 g in sodium chloride  0.9 % 100 mL IVPB        1 g 200 mL/hr over 30 Minutes Intravenous  Once 04/30/24 0206 04/30/24 0244        MEDICATIONS: Scheduled Meds:   stroke: early stages of recovery book   Does not apply Once   amLODipine   10 mg Oral Daily   aspirin  EC  81 mg Oral Daily   atorvastatin   80 mg Oral Daily   busPIRone   10 mg Oral BID   [START ON 05/04/2024] cloNIDine  0.1 mg Transdermal Q Sat   ezetimibe   10 mg Oral Daily   FLUoxetine   40 mg Oral Daily   heparin  injection (subcutaneous)  5,000 Units Subcutaneous Q8H   hydrochlorothiazide   12.5 mg Oral Daily   insulin  aspart  0-9 Units Subcutaneous TID WC   pantoprazole  (PROTONIX ) IV  40 mg Intravenous Q12H   Continuous Infusions:  sodium chloride  50 mL/hr at 05/01/24 9682   cefTRIAXone  (ROCEPHIN )  IV Stopped (04/30/24 2210)   PRN Meds:.acetaminophen  **OR** acetaminophen , hydrALAZINE , HYDROmorphone  (DILAUDID ) injection, melatonin, ondansetron  (ZOFRAN ) IV, perflutren  lipid microspheres (DEFINITY ) IV suspension   I have personally reviewed following labs and imaging  studies  LABORATORY DATA: CBC: Recent Labs  Lab 04/29/24 1904 04/29/24 1909 04/30/24 0758 05/01/24 0336  WBC 6.4  --  4.4 6.0  NEUTROABS 4.8  --  2.7  --   HGB 11.7* 11.9* 10.7* 11.2*  HCT 35.9* 35.0* 32.3* 34.1*  MCV 83.3  --  82.6 82.6  PLT 254  --  215 238    Basic Metabolic Panel: Recent Labs  Lab 04/29/24 1904 04/29/24 1909 04/30/24 0758 05/01/24 0336  NA 143 144 144 142  K 3.7 3.6 3.3* 3.7  CL 102 102 108 105  CO2 28  --  27 22  GLUCOSE 98 103* 95 98  BUN 25* 25* 22 21  CREATININE 0.82 0.90 0.74 0.94  CALCIUM  10.9*  --  10.0 10.4*  MG  --   --  1.7 1.8  PHOS  --   --  2.7  --     GFR: CrCl cannot be calculated (Unknown ideal weight.).  Liver Function Tests: Recent Labs  Lab 04/29/24 1904 04/30/24 0758 05/01/24 0336  AST 26 23  29  ALT 22 20 24   ALKPHOS 80 71 79  BILITOT 0.4 0.3 0.4  PROT 7.9 6.9 7.4  ALBUMIN 4.0 3.5 3.7   No results for input(s): LIPASE, AMYLASE in the last 168 hours. No results for input(s): AMMONIA in the last 168 hours.  Coagulation Profile: No results for input(s): INR, PROTIME in the last 168 hours.  Cardiac Enzymes: Recent Labs  Lab 04/30/24 0250  CKTOTAL 96    BNP (last 3 results) Recent Labs    04/30/24 0946  PROBNP 1,432.0*    Lipid Profile: Recent Labs    05/01/24 0336  CHOL 138  HDL 51  LDLCALC 70  TRIG 87  CHOLHDL 2.7    Thyroid  Function Tests: Recent Labs    04/30/24 0758 04/30/24 1407  TSH 0.498  --   FREET4  --  1.46    Anemia Panel: Recent Labs    04/30/24 0758 04/30/24 1407 04/30/24 2029  VITAMINB12  --   --  1,694*  FOLATE  --   --  >20.0  FERRITIN  --  421*  --   TIBC  --  287  --   IRON  --  44  --   RETICCTPCT 1.7  --   --     Urine analysis:    Component Value Date/Time   COLORURINE YELLOW 04/30/2024 0112   APPEARANCEUR CLOUDY (A) 04/30/2024 0112   LABSPEC 1.009 04/30/2024 0112   PHURINE 7.0 04/30/2024 0112   GLUCOSEU NEGATIVE 04/30/2024 0112   HGBUR  SMALL (A) 04/30/2024 0112   HGBUR negative 03/12/2010 0847   BILIRUBINUR NEGATIVE 04/30/2024 0112   BILIRUBINUR n 09/11/2015 1039   KETONESUR NEGATIVE 04/30/2024 0112   PROTEINUR >=300 (A) 04/30/2024 0112   UROBILINOGEN 1.0 09/11/2015 1039   UROBILINOGEN 1.0 05/19/2014 1858   NITRITE NEGATIVE 04/30/2024 0112   LEUKOCYTESUR SMALL (A) 04/30/2024 0112    Sepsis Labs: Lactic Acid, Venous    Component Value Date/Time   LATICACIDVEN 1.3 04/30/2024 1636    MICROBIOLOGY: No results found for this or any previous visit (from the past 240 hours).  RADIOLOGY STUDIES/RESULTS: ECHOCARDIOGRAM COMPLETE BUBBLE STUDY Result Date: 05/01/2024    ECHOCARDIOGRAM REPORT   Patient Name:   [name] Date of Exam: 05/01/2024 Medical Rec #:  [id number]       Height:       64.0 in Accession #:    [id number]      Weight:       150.1 lb Date of Birth:  1960/12/02       BSA:          1.732 m Patient Age:    63 years        BP:           142/92 mmHg Patient Gender: F               HR:           108 bpm. Exam Location:  Inpatient Procedure: 2D Echo, Cardiac Doppler, Color Doppler, Intracardiac Opacification            Agent and Saline Contrast Bubble Study (Both Spectral and Color Flow            Doppler were utilized during procedure). Indications:    Stroke  History:        Patient has prior history of Echocardiogram examinations, most                 recent 05/31/2022. Risk Factors:Hypertension,  Diabetes and                 Dyslipidemia.  Sonographer:    Sherlean Dubin Referring Phys: [id number] EKTA V PATEL  Sonographer Comments: Suboptimal apical window. Image acquisition challenging due to patient body habitus and Image acquisition challenging due to respiratory motion. IMPRESSIONS  1. Technically difficult; saline microcavitation study not interpretable.  2. Left ventricular ejection fraction, by estimation, is >75%. The left ventricle has hyperdynamic function. The left ventricle has no regional wall motion  abnormalities. There is moderate left ventricular hypertrophy. Left ventricular diastolic parameters are consistent with Grade I diastolic dysfunction (impaired relaxation).  3. Right ventricular systolic function is normal. The right ventricular size is normal.  4. The mitral valve is normal in structure. No evidence of mitral valve regurgitation. No evidence of mitral stenosis.  5. The aortic valve is tricuspid. Aortic valve regurgitation is not visualized. No aortic stenosis is present.  6. The inferior vena cava is normal in size with greater than 50% respiratory variability, suggesting right atrial pressure of 3 mmHg. FINDINGS  Left Ventricle: Left ventricular ejection fraction, by estimation, is >75%. The left ventricle has hyperdynamic function. The left ventricle has no regional wall motion abnormalities. Definity  contrast agent was given IV to delineate the left ventricular endocardial borders. The left ventricular internal cavity size was normal in size. There is moderate left ventricular hypertrophy. Left ventricular diastolic parameters are consistent with Grade I diastolic dysfunction (impaired relaxation). Right Ventricle: The right ventricular size is normal. Right ventricular systolic function is normal. Left Atrium: Left atrial size was normal in size. Right Atrium: Right atrial size was normal in size. Pericardium: Trivial pericardial effusion is present. Mitral Valve: The mitral valve is normal in structure. No evidence of mitral valve regurgitation. No evidence of mitral valve stenosis. Tricuspid Valve: The tricuspid valve is normal in structure. Tricuspid valve regurgitation is trivial. No evidence of tricuspid stenosis. Aortic Valve: The aortic valve is tricuspid. Aortic valve regurgitation is not visualized. No aortic stenosis is present. Aortic valve peak gradient measures 6.4 mmHg. Pulmonic Valve: The pulmonic valve was not well visualized. Pulmonic valve regurgitation is not visualized. No  evidence of pulmonic stenosis. Aorta: The aortic root is normal in size and structure. Venous: The inferior vena cava is normal in size with greater than 50% respiratory variability, suggesting right atrial pressure of 3 mmHg. IAS/Shunts: The interatrial septum was not well visualized. Additional Comments: Technically difficult; saline microcavitation study not interpretable.  LEFT VENTRICLE PLAX 2D LVIDd:         3.50 cm   Diastology LVIDs:         2.30 cm   LV e' medial:    6.42 cm/s LV PW:         1.00 cm   LV E/e' medial:  8.6 LV IVS:        1.20 cm   LV e' lateral:   6.96 cm/s LVOT diam:     2.00 cm   LV E/e' lateral: 7.9 LV SV:         47 LV SV Index:   27 LVOT Area:     3.14 cm  RIGHT VENTRICLE             IVC RV S prime:     22.40 cm/s  IVC diam: 1.50 cm TAPSE (M-mode): 1.3 cm LEFT ATRIUM             Index  RIGHT ATRIUM           Index LA diam:        3.00 cm 1.73 cm/m   RA Area:     13.00 cm LA Vol (A2C):   36.9 ml 21.31 ml/m  RA Volume:   32.10 ml  18.53 ml/m LA Vol (A4C):   31.5 ml 18.19 ml/m LA Biplane Vol: 35.0 ml 20.21 ml/m  AORTIC VALVE AV Area (Vmax): 2.44 cm AV Vmax:        126.00 cm/s AV Peak Grad:   6.4 mmHg LVOT Vmax:      97.70 cm/s LVOT Vmean:     63.200 cm/s LVOT VTI:       0.150 m  AORTA Ao Root diam: 2.70 cm Ao Asc diam:  3.00 cm MITRAL VALVE               TRICUSPID VALVE MV Area (PHT): 6.32 cm    TR Peak grad:   6.0 mmHg MV Decel Time: 120 msec    TR Vmax:        122.00 cm/s MV E velocity: 55.30 cm/s MV A velocity: 89.70 cm/s  SHUNTS MV E/A ratio:  0.62        Systemic VTI:  0.15 m                            Systemic Diam: 2.00 cm Redell Shallow MD Electronically signed by Redell Shallow MD Signature Date/Time: 05/01/2024/10:04:54 AM    Final    MR BRAIN WO CONTRAST Result Date: 04/30/2024 EXAM: MRI BRAIN WITHOUT CONTRAST 04/30/2024 07:01:38 PM TECHNIQUE: Multiplanar multisequence MRI of the head/brain was performed without the administration of intravenous contrast.  COMPARISON: CT head 04/29/2024 and MRI head 03/01/2024. CLINICAL HISTORY: Mental status change, unknown cause. FINDINGS: BRAIN AND VENTRICLES: No acute infarct. No intracranial hemorrhage. No mass. No midline shift. No hydrocephalus. Scattered T2 and FLAIR hyperintensity in the periventricular and subcortical white matter compatible with mild chronic microvascular ischemic changes. Similar appearance of chronic encephalomalacia in the ventral medulla. Bilateral cerebellar remote infarcts. Similar small remote infarct along the right paramedian dorsal pons. Similar loss of the flow voids within the V4 segments of the vertebral arteries, right greater than left, as well as within the proximal basilar artery, similar to prior. Preserved flow void within the distal basilar artery, similar to prior. The sella is unremarkable. ORBITS: No acute abnormality. SINUSES AND MASTOIDS: Mucosal thickening in the ethmoid sinuses. Trace fluid in the left mastoid air cells. BONES AND SOFT TISSUES: Normal marrow signal. No acute soft tissue abnormality. IMPRESSION: 1. No acute intracranial abnormality. 2. Similar loss of flow voids within the V4 segments of the vertebral arteries, right greater than left, and within the proximal basilar artery. 3. 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 Electronically signed by: Donnice Mania MD 04/30/2024 08:28 PM EST RP Workstation: [id number]   CT Angio Chest PE W and/or Wo Contrast Result Date: 04/30/2024 EXAM: CTA CHEST 04/30/2024 10:14:21 AM TECHNIQUE: CTA of the chest was performed without and with the administration of 75 mL of iohexol  (OMNIPAQUE ) 350 MG/ML injection. Multiplanar reformatted images are provided for review. MIP images are provided for review. Automated exposure control, iterative reconstruction, and/or weight based adjustment of the mA/kV was utilized to reduce the radiation dose to as low as reasonably  achievable. COMPARISON: None available. CLINICAL HISTORY: Pulmonary embolism (PE) suspected, high prob. FINDINGS:  PULMONARY ARTERIES: Pulmonary arteries are adequately opacified for evaluation. No acute pulmonary embolus. The main pulmonary artery measures at the upper limits of normal at 3.2 cm. The limited evaluation of the subsegmental level is due to motion artifact. MEDIASTINUM: Aberrant right subclavian arteryis noted. The thoracic aorta is normal in caliber. The heart and pericardium demonstrate no acute abnormality other than cardiomegaly. LYMPH NODES: No mediastinal, hilar or axillary lymphadenopathy. LUNGS AND PLEURA: Bibasilar linear atelectasis versus scarring. Expiratory phase of respiration. Mosaic attenuation of the lungs. No focal consolidation or pulmonary edema. No evidence of pleural effusion or pneumothorax. UPPER ABDOMEN: Colonic diverticulosis. SOFT TISSUES AND BONES: Enlarged heterogeneous right thyroid  gland with underlying hyperdense and hypodense masses. No acute bone abnormality. Heterogenous appearance of the spine suggestive of osteopenia. IMPRESSION: 1. No pulmonary embolism. Limited evaluation at the subsegmental level due to motion artifact and adjacent lung disease. 2. No acute findings in the setting of expiratory phase of respiration and linear scarring/atelectasis. 3. Prominent pulmonary artery size - correlate for pulmonary hypertension. 4. Cardiomegaly. 5. Enlarged heterogeneous right thyroid  gland with underlying masses; recommend non-emergent thyroid  ultrasound. Electronically signed by: Morgane Naveau MD 04/30/2024 11:58 AM EST RP Workstation: [id number]   DG Femur Portable Min 2 Views Left Result Date: 04/30/2024 EXAM: 2 VIEW(S) XRAY OF THE _LATERALITY_ FEMUR 04/30/2024 01:17:00 AM COMPARISON: None available. CLINICAL HISTORY: pain FINDINGS: BONES AND JOINTS: No acute fracture. No malalignment. SOFT TISSUES: Vascular calcifications. IMPRESSION: 1. No acute findings.  Electronically signed by: Franky Stanford MD 04/30/2024 02:56 AM EST RP Workstation: [id number]   DG Chest Portable 1 View Result Date: 04/30/2024 EXAM: 1 VIEW(S) XRAY OF THE CHEST 04/30/2024 01:18:00 AM COMPARISON: 03/01/2024 CLINICAL HISTORY: pain FINDINGS: LUNGS AND PLEURA: Low lung volumes. Stable linear atelectasis or scarring in left mid lung. No pleural effusion. No pneumothorax. HEART AND MEDIASTINUM: Aortic atherosclerosis. No acute abnormality of the cardiac silhouette. BONES AND SOFT TISSUES: No acute osseous abnormality. IMPRESSION: 1. Stable linear atelectasis or scarring in left mid lung. 2. Low lung volumes. Electronically signed by: Franky Stanford MD 04/30/2024 02:56 AM EST RP Workstation: [id number]   DG Femur Portable Min 2 Views Right Result Date: 04/30/2024 EXAM: 2 VIEW(S) XRAY OF THE FEMUR 04/30/2024 01:14:00 AM COMPARISON: None available. CLINICAL HISTORY: pain FINDINGS: BONES AND JOINTS: No acute fracture. No malalignment. SOFT TISSUES: Scattered femoral arterial calcifications. IMPRESSION: 1. No acute findings. Electronically signed by: Franky Stanford MD 04/30/2024 02:55 AM EST RP Workstation: [id number]   CT ABDOMEN PELVIS WO CONTRAST Result Date: 04/29/2024 EXAM: CT ABDOMEN AND PELVIS WITHOUT CONTRAST 04/29/2024 07:26:02 PM TECHNIQUE: CT of the abdomen and pelvis was performed without the administration of intravenous contrast. Multiplanar reformatted images are provided for review. Automated exposure control, iterative reconstruction, and/or weight-based adjustment of the mA/kV was utilized to reduce the radiation dose to as low as reasonably achievable. COMPARISON: Findings are compared to 10/04/2022. CLINICAL HISTORY: Abdominal pain, acute, nonlocalized. Chronic lymphocytic leukemia. Tracking code: Bo. FINDINGS: LOWER CHEST: No acute abnormality. LIVER: The liver is unremarkable. GALLBLADDER AND BILE DUCTS: Gallbladder is unremarkable. No biliary ductal dilatation. SPLEEN: No acute  abnormality. PANCREAS: No acute abnormality. ADRENAL GLANDS: No acute abnormality. KIDNEYS, URETERS AND BLADDER: Right kidney: Simple cortical cyst noted within the lower pole of the right kidney for which no follow up imaging is recommended. Left kidney: Left lower pole exophytic low attenuation lesion was previously characterized as a simple cyst and demonstrates interval decrease in size in keeping with an involuting cyst. Again no follow up imaging is  recommended for this lesion. Per consensus, no follow-up is needed for simple Bosniak type 1 and 2 renal cysts, unless the patient has a malignancy history or risk factors. Mild nonspecific bilateral perinephric stranding is unchanged. No stones in the kidneys or ureters. No hydronephrosis. No periureteral stranding. Urinary bladder: Gas within the bladder lumen is nonspecific, possibly related to recent catheterization. Urosepsis. GI AND BOWEL: Stomach demonstrates no acute abnormality. Appendix normal. The small bowel and large bowel are otherwise unremarkable. There is no bowel obstruction. PERITONEUM AND RETROPERITONEUM: No ascites. No free air. VASCULATURE: Aorta is normal in caliber. No aortic aneurysm. Mild aortoiliac atherosclerotic calcification. LYMPH NODES: Previously noted pathologic retroperitoneal and pelvic adenopathy has resolved. No pathologic residual adenopathy is seen within the abdomen and pelvis. REPRODUCTIVE ORGANS: No adnexal masses. BONES AND SOFT TISSUES: The osseous structures are diffusely osteopenic, progressive since prior examination. Tiny fat-containing umbilical hernia. IMPRESSION: 1. No acute findings in the abdomen or pelvis to explain abdominal pain. 2. Resolution of previously noted pathologic retroperitoneal and pelvic adenopathy, with no pathologic residual adenopathy in the abdomen or pelvis. 3. Gas within the bladder lumen, possibly related to recent catheterization. Correlation with clinical history is recommended. 4.  Diffuse osteopenia, progressive since prior examination Electronically signed by: Dorethia Molt MD 04/29/2024 09:54 PM EST RP Workstation: [id number]   CT Head Wo Contrast Result Date: 04/29/2024 EXAM: CT HEAD WITHOUT CONTRAST 04/29/2024 07:26:02 PM TECHNIQUE: CT of the head was performed without the administration of intravenous contrast. Automated exposure control, iterative reconstruction, and/or weight based adjustment of the mA/kV was utilized to reduce the radiation dose to as low as reasonably achievable. COMPARISON: 03/01/2024 CLINICAL HISTORY: Mental status change, unknown cause FINDINGS: BRAIN AND VENTRICLES: No acute hemorrhage. No evidence of acute infarct. No hydrocephalus. No extra-axial collection. No mass effect or midline shift. Moderate vascular calcifications within the carotid siphons. ORBITS: No acute abnormality. SINUSES: No acute abnormality. SOFT TISSUES AND SKULL: No acute soft tissue abnormality. No skull fracture. IMPRESSION: 1. No acute intracranial abnormality. Electronically signed by: Franky Stanford MD 04/29/2024 08:34 PM EST RP Workstation: [id number]     LOS: 0 days   Donalda Applebaum, MD  [facility]    To contact the attending provider between 7A-7P or the covering provider during after hours 7P-7A, please log into the web site [email or web address] and access using universal [place] password for that web site. If you do not have the password, please call the hospital operator.  05/01/2024, 10:13 AM    "

## 2024-05-01 NOTE — Plan of Care (Signed)

## 2024-05-01 NOTE — Consult Note (Signed)
 [facility] Hematology and oncology consult note   Patient Care Team: Lisa Castilla, DO as PCP - General Patel, Donika K, DO as Consulting Physician (Neurology)   ASSESSMENT & PLAN:  63 y.o.female with past medical history of CVA-left-sided residual hemiparesis, HTN, HLD, DM-2, SLL consulted for reinitiation of venetoclax  after acute hospitalization.  Patient's altered mental status appear resolved. Report she is able to eat and tolerate p.o.  She is alert and oriented on exam today.   She is being treated for UTI.  Patient has history of SLL and has been on venetoclax . Venetoclax  would not cause her presentation.  Recent CT showed resolution of previous lymphadenopathy.  Last reported 200 mg once daily.  May resume venetoclax  as able. Assessment & Plan Acute metabolic encephalopathy Resolved Continue UTI treatment per primary team.  Appreciate management  Small lymphocytic lymphoma (HCC) Man resume home venetoclax  200 mg daily when able to resume p.o. safely Continue follow-up as outpatient with Dr. Onesimo.  Patient is scheduled for 05/22/2024   Discharge planning Follow-up with Dr. Onesimo as scheduled on 05/22/2024.  All questions were answered.   Lisa JAYSON Chihuahua, MD 05/01/2024 5:31 PM   CHIEF COMPLAINTS/PURPOSE OF ADMISSION History of CLL on treatment  HISTORY OF PRESENTING ILLNESS:   Patient is a 63 y.o.  female with history of CVA-left-sided residual hemiparesis, HTN, HLD, DM-2, non-Hodgkin's lymphoma-who was brought to the ED from SNF for altered mental status/B/L lower extremity pain.  We were contacted due to history of SLL on venetoclax . Report history of CVA with residual left-sided weakness/hemiparesis and dysarthria. On presentation report altered mental status with white count of 6400 CMP showing calcium  of 10.9 CPK of 96 lactic of 1.2. UA showed 21-50 white blood cells and many bacteria and culture ordered along with Rocephin . She is currently AO x 3 and  feeling more clear and not confused. Able to swallow and eat.   MEDICAL HISTORY:  Past Medical History:  Diagnosis Date   Abnormality of gait 05/10/2010   BACK PAIN 11/14/2008   Class 1 obesity due to excess calories with body mass index (BMI) of 31.0 to 31.9 in adult 02/07/2021   DIABETES MELLITUS, TYPE II 07/15/2008   Diplopia 07/15/2008   ECZEMA, ATOPIC 04/03/2009   GERD (gastroesophageal reflux disease)    Guillain-Barre 1988   HYPERLIPIDEMIA 03/06/2009   HYPERTENSION 07/15/2008   Stroke (HCC) 2010, 2011   x2    Vertebral artery stenosis     SURGICAL HISTORY: Past Surgical History:  Procedure Laterality Date   ABDOMINAL HYSTERECTOMY     BIOPSY  07/08/2022   Procedure: BIOPSY;  Surgeon: Lisa Elspeth SQUIBB, MD;  Location: [facility] ENDOSCOPY;  Service: Gastroenterology;;   BIOPSY  10/12/2022   Procedure: BIOPSY;  Surgeon: Lisa Sandor GAILS, DO;  Location: [facility] ENDOSCOPY;  Service: Gastroenterology;;   COLONOSCOPY WITH PROPOFOL  N/A 07/08/2022   Procedure: COLONOSCOPY WITH PROPOFOL ;  Surgeon: Lisa Elspeth SQUIBB, MD;  Location: [facility] ENDOSCOPY;  Service: Gastroenterology;  Laterality: N/A;   DILATION AND CURETTAGE OF UTERUS     FLEXIBLE SIGMOIDOSCOPY N/A 10/12/2022   Procedure: FLEXIBLE SIGMOIDOSCOPY;  Surgeon: Lisa Sandor GAILS, DO;  Location: [facility] ENDOSCOPY;  Service: Gastroenterology;  Laterality: N/A;   FOOT SURGERY     IR ANGIO INTRA EXTRACRAN SEL COM CAROTID INNOMINATE UNI L MOD SED  09/17/2021   IR ANGIO INTRA EXTRACRAN SEL INTERNAL CAROTID UNI R MOD SED  09/17/2021   IR ANGIO VERTEBRAL SEL VERTEBRAL UNI R MOD SED  09/17/2021  IR US  GUIDE VASC ACCESS RIGHT  09/17/2021    SOCIAL HISTORY: Social History   Socioeconomic History   Marital status: Divorced    Spouse name: Not on file   Number of children: 0   Years of education: 13   Highest education level: Not on file  Occupational History   Occupation: custodian at [employer]  Tobacco Use   Smoking status: Never    Smokeless tobacco: Never  Vaping Use   Vaping status: Never Used  Substance and Sexual Activity   Alcohol use: No   Drug use: No   Sexual activity: Not Currently  Other Topics Concern   Not on file  Social History Narrative   Right Handed    Lives at [facility]   No caffeine   Social Drivers of Health   Tobacco Use: Low Risk (05/01/2024)   Patient History    Smoking Tobacco Use: Never    Smokeless Tobacco Use: Never    Passive Exposure: Not on file  Financial Resource Strain: Not on file  Food Insecurity: No Food Insecurity (05/01/2024)   Epic    Worried About Programme Researcher, Broadcasting/film/video in the Last Year: Never true    Ran Out of Food in the Last Year: Never true  Transportation Needs: No Transportation Needs (05/01/2024)   Epic    Lack of Transportation (Medical): No    Lack of Transportation (Non-Medical): No  Physical Activity: Not on file  Stress: Not on file  Social Connections: Not on file  Intimate Partner Violence: Not At Risk (05/01/2024)   Epic    Fear of Current or Ex-Partner: No    Emotionally Abused: No    Physically Abused: No    Sexually Abused: No  Depression (PHQ2-9): Low Risk (03/16/2022)   Depression (PHQ2-9)    PHQ-2 Score: 0  Alcohol Screen: Not on file  Housing: Low Risk (05/01/2024)   Epic    Unable to Pay for Housing in the Last Year: No    Number of Times Moved in the Last Year: 0    Homeless in the Last Year: No  Utilities: Not At Risk (05/01/2024)   Epic    Threatened with loss of utilities: No  Health Literacy: Not on file    FAMILY HISTORY: Family History  Problem Relation Age of Onset   Diabetes Sister    Asthma Other    Stroke Other    Hypertension Other    Diabetes Mother    Stroke Mother    Cancer Father        pt states hae had some kind of stomach cancer, ? stomach or colon   Breast cancer Neg Hx     ALLERGIES:  has no known allergies.  MEDICATIONS:  Current Facility-Administered Medications  Medication Dose  Route Frequency Provider Last Rate Last Admin   0.9 %  sodium chloride  infusion   Intravenous Continuous Tobie Mario GAILS, MD 50 mL/hr at 05/01/24 0317 Infusion Verify at 05/01/24 0317   acetaminophen  (TYLENOL ) tablet 650 mg  650 mg Oral Q6H PRN Howerter, Justin B, DO   650 mg at 04/30/24 2348   Or   acetaminophen  (TYLENOL ) suppository 650 mg  650 mg Rectal Q6H PRN Howerter, Justin B, DO       amLODipine  (NORVASC ) tablet 10 mg  10 mg Oral Daily Tobie Mario GAILS, MD   10 mg at 05/01/24 9040   aspirin  EC tablet 81 mg  81 mg Oral Daily Tobie Mario  V, MD   81 mg at 05/01/24 0959   atorvastatin  (LIPITOR ) tablet 80 mg  80 mg Oral Daily Patel, Ekta V, MD   80 mg at 05/01/24 9041   busPIRone  (BUSPAR ) tablet 10 mg  10 mg Oral BID Patel, Ekta V, MD   10 mg at 05/01/24 0958   carvedilol (COREG) tablet 6.25 mg  6.25 mg Oral BID WC Ghimire, Shanker M, MD   6.25 mg at 05/01/24 1128   cefTRIAXone  (ROCEPHIN ) 1 g in sodium chloride  0.9 % 100 mL IVPB  1 g Intravenous Q24H Howerter, Justin B, DO   Stopped at 04/30/24 2210   [START ON 05/04/2024] cloNIDine (CATAPRES - Dosed in mg/24 hr) patch 0.1 mg  0.1 mg Transdermal Q Sat Tobie Mario GAILS, MD       ezetimibe  (ZETIA ) tablet 10 mg  10 mg Oral Daily Tobie Mario GAILS, MD   10 mg at 05/01/24 9040   FLUoxetine  (PROZAC ) capsule 40 mg  40 mg Oral Daily Patel, Ekta V, MD   40 mg at 05/01/24 0959   heparin  injection 5,000 Units  5,000 Units Subcutaneous Q8H Tobie Mario GAILS, MD   5,000 Units at 05/01/24 1330   hydrALAZINE  (APRESOLINE ) injection 10 mg  10 mg Intravenous Q4H PRN Patel, Ekta V, MD   10 mg at 05/01/24 0012   hydrochlorothiazide  (HYDRODIURIL ) tablet 12.5 mg  12.5 mg Oral Daily Tobie Mario GAILS, MD   12.5 mg at 05/01/24 9040   HYDROmorphone  (DILAUDID ) injection 0.5 mg  0.5 mg Intravenous Q4H PRN Opyd, Timothy S, MD   0.5 mg at 05/01/24 1008   insulin  aspart (novoLOG ) injection 0-9 Units  0-9 Units Subcutaneous TID WC Howerter, Justin B, DO   3 Units at 05/01/24 1258   labetalol   (NORMODYNE ) injection 10 mg  10 mg Intravenous Q4H PRN Ghimire, Donalda HERO, MD       melatonin tablet 3 mg  3 mg Oral QHS PRN Howerter, Justin B, DO       ondansetron  (ZOFRAN ) injection 4 mg  4 mg Intravenous Q6H PRN Howerter, Justin B, DO       pantoprazole  (PROTONIX ) injection 40 mg  40 mg Intravenous Q12H Tobie Mario GAILS, MD   40 mg at 05/01/24 9041   venetoclax  (VENCLEXTA ) tablet 200 mg  200 mg Oral Daily Reome, Earle J, RPH        REVIEW OF SYSTEMS:   Constitutional: Denies fevers, chills Respiratory: Denies cough, shortness of breath Cardiovascular: Denies chest pain Gastrointestinal:  Denies nausea, vomiting, diarrhea, abdominal pain GU: Denies any hesitancy, dysuria, but has frequency   PHYSICAL EXAMINATION: ECOG PERFORMANCE STATUS: 3  Vitals:   05/01/24 1600 05/01/24 1643  BP: (!) 201/108   Pulse: 88   Resp: (!) 23   Temp: 98 F (36.7 C) 98 F (36.7 C)  SpO2:     Filed Weights   05/01/24 0500  Weight: 150 lb 2.1 oz (68.1 kg)    GENERAL: alert, no distress and comfortable EYES:  sclera clear NECK: supple. No mass LYMPH:  no palpable cervical lymphadenopathy LUNGS: clear to auscultation and normal breathing effort.  No wheeze or rales HEART: regular rate & rhythm and no murmurs ABDOMEN:abdomen soft, non-tender Musculoskeletal:  no lower extremity edema NEURO: alert & oriented x 3   LABORATORY DATA:  I have reviewed the data as listed Lab Results  Component Value Date   WBC 6.0 05/01/2024   HGB 11.2 (L) 05/01/2024   HCT 34.1 (L) 05/01/2024  MCV 82.6 05/01/2024   PLT 238 05/01/2024   Recent Labs    04/29/24 1904 04/29/24 1909 04/30/24 0758 05/01/24 0336  NA 143 144 144 142  Mata 3.7 3.6 3.3* 3.7  CL 102 102 108 105  CO2 28  --  27 22  GLUCOSE 98 103* 95 98  BUN 25* 25* 22 21  CREATININE 0.82 0.90 0.74 0.94  CALCIUM  10.9*  --  10.0 10.4*  GFRNONAA >60  --  >60 >60  PROT 7.9  --  6.9 7.4  ALBUMIN 4.0  --  3.5 3.7  AST 26  --  23 29  ALT 22  --  20  24  ALKPHOS 80  --  71 79  BILITOT 0.4  --  0.3 0.4    RADIOGRAPHIC STUDIES: I have personally reviewed the radiological images as listed and agreed with the findings in the report. VAS US  LOWER EXTREMITY VENOUS (DVT) Result Date: 05/01/2024  Lower Venous DVT Study Patient Name:  [name]  Date of Exam:   05/01/2024 Medical Rec #: [id number]        Accession #:    [id number] Date of Birth: [birth year]        Patient Gender: F Patient Age:   38 years Exam Location:  [facility] Procedure:      VAS US  LOWER EXTREMITY VENOUS (DVT) Referring Phys: EKTA PATEL --------------------------------------------------------------------------------  Indications: History of CVA with residual left sided weakness/hemiparesis and dysarthria, Swelling, and Pain.  Risk Factors: DM, HTN, HLD. Comparison Study: No priors. Performing Technologist: Ricka Sturdivant-Jones RDMS, RVT  Examination Guidelines: A complete evaluation includes B-mode imaging, spectral Doppler, color Doppler, and power Doppler as needed of all accessible portions of each vessel. Bilateral testing is considered an integral part of a complete examination. Limited examinations for reoccurring indications may be performed as noted. The reflux portion of the exam is performed with the patient in reverse Trendelenburg.  +---------+---------------+---------+-----------+----------+-------------------+ RIGHT    CompressibilityPhasicitySpontaneityPropertiesThrombus Aging      +---------+---------------+---------+-----------+----------+-------------------+ CFV      Full           Yes      Yes                                      +---------+---------------+---------+-----------+----------+-------------------+ SFJ      Full                                                             +---------+---------------+---------+-----------+----------+-------------------+ FV Prox  Full                                                              +---------+---------------+---------+-----------+----------+-------------------+ FV Mid   Full           Yes      Yes                                      +---------+---------------+---------+-----------+----------+-------------------+  FV DistalFull                                                             +---------+---------------+---------+-----------+----------+-------------------+ PFV      Full                                                             +---------+---------------+---------+-----------+----------+-------------------+ POP      Full           Yes      Yes                                      +---------+---------------+---------+-----------+----------+-------------------+ PTV      Full                                                             +---------+---------------+---------+-----------+----------+-------------------+ PERO                                                  not well visualized +---------+---------------+---------+-----------+----------+-------------------+   +---------+---------------+---------+-----------+----------+--------------+ LEFT     CompressibilityPhasicitySpontaneityPropertiesThrombus Aging +---------+---------------+---------+-----------+----------+--------------+ CFV      Full           Yes      Yes                                 +---------+---------------+---------+-----------+----------+--------------+ SFJ      Full                                                        +---------+---------------+---------+-----------+----------+--------------+ FV Prox  Full                                                        +---------+---------------+---------+-----------+----------+--------------+ FV Mid   Full           Yes      Yes                                 +---------+---------------+---------+-----------+----------+--------------+ FV DistalFull                                                         +---------+---------------+---------+-----------+----------+--------------+  PFV      Full                                                        +---------+---------------+---------+-----------+----------+--------------+ POP      Full           Yes      Yes                                 +---------+---------------+---------+-----------+----------+--------------+ PTV      Full                                                        +---------+---------------+---------+-----------+----------+--------------+ PERO     Full                                                        +---------+---------------+---------+-----------+----------+--------------+     Summary: RIGHT: - There is no evidence of deep vein thrombosis in the lower extremity. However, portions of this examination were limited- see technologist comments above.  - No cystic structure found in the popliteal fossa.  LEFT: - There is no evidence of deep vein thrombosis in the lower extremity.  - No cystic structure found in the popliteal fossa.  *See table(s) above for measurements and observations. Electronically signed by Penne Colorado MD on 05/01/2024 at 5:02:20 PM.    Final    ECHOCARDIOGRAM COMPLETE BUBBLE STUDY Result Date: 05/01/2024    ECHOCARDIOGRAM REPORT   Patient Name:   [name] Date of Exam: 05/01/2024 Medical Rec #:  [id number]       Height:       64.0 in Accession #:    [id number]      Weight:       150.1 lb Date of Birth:  Apr 30, 1961       BSA:          1.732 m Patient Age:    63 years        BP:           142/92 mmHg Patient Gender: F               HR:           108 bpm. Exam Location:  Inpatient Procedure: 2D Echo, Cardiac Doppler, Color Doppler, Intracardiac Opacification            Agent and Saline Contrast Bubble Study (Both Spectral and Color Flow            Doppler were utilized during procedure). Indications:    Stroke  History:        Patient has prior history of Echocardiogram  examinations, most                 recent 05/31/2022. Risk Factors:Hypertension, Diabetes and                 Dyslipidemia.  Sonographer:    Sherlean Dubin  Referring Phys: [id number] EKTA V PATEL  Sonographer Comments: Suboptimal apical window. Image acquisition challenging due to patient body habitus and Image acquisition challenging due to respiratory motion. IMPRESSIONS  1. Technically difficult; saline microcavitation study not interpretable.  2. Left ventricular ejection fraction, by estimation, is >75%. The left ventricle has hyperdynamic function. The left ventricle has no regional wall motion abnormalities. There is moderate left ventricular hypertrophy. Left ventricular diastolic parameters are consistent with Grade I diastolic dysfunction (impaired relaxation).  3. Right ventricular systolic function is normal. The right ventricular size is normal.  4. The mitral valve is normal in structure. No evidence of mitral valve regurgitation. No evidence of mitral stenosis.  5. The aortic valve is tricuspid. Aortic valve regurgitation is not visualized. No aortic stenosis is present.  6. The inferior vena cava is normal in size with greater than 50% respiratory variability, suggesting right atrial pressure of 3 mmHg. FINDINGS  Left Ventricle: Left ventricular ejection fraction, by estimation, is >75%. The left ventricle has hyperdynamic function. The left ventricle has no regional wall motion abnormalities. Definity  contrast agent was given IV to delineate the left ventricular endocardial borders. The left ventricular internal cavity size was normal in size. There is moderate left ventricular hypertrophy. Left ventricular diastolic parameters are consistent with Grade I diastolic dysfunction (impaired relaxation). Right Ventricle: The right ventricular size is normal. Right ventricular systolic function is normal. Left Atrium: Left atrial size was normal in size. Right Atrium: Right atrial size was normal in size.  Pericardium: Trivial pericardial effusion is present. Mitral Valve: The mitral valve is normal in structure. No evidence of mitral valve regurgitation. No evidence of mitral valve stenosis. Tricuspid Valve: The tricuspid valve is normal in structure. Tricuspid valve regurgitation is trivial. No evidence of tricuspid stenosis. Aortic Valve: The aortic valve is tricuspid. Aortic valve regurgitation is not visualized. No aortic stenosis is present. Aortic valve peak gradient measures 6.4 mmHg. Pulmonic Valve: The pulmonic valve was not well visualized. Pulmonic valve regurgitation is not visualized. No evidence of pulmonic stenosis. Aorta: The aortic root is normal in size and structure. Venous: The inferior vena cava is normal in size with greater than 50% respiratory variability, suggesting right atrial pressure of 3 mmHg. IAS/Shunts: The interatrial septum was not well visualized. Additional Comments: Technically difficult; saline microcavitation study not interpretable.  LEFT VENTRICLE PLAX 2D LVIDd:         3.50 cm   Diastology LVIDs:         2.30 cm   LV e' medial:    6.42 cm/s LV PW:         1.00 cm   LV E/e' medial:  8.6 LV IVS:        1.20 cm   LV e' lateral:   6.96 cm/s LVOT diam:     2.00 cm   LV E/e' lateral: 7.9 LV SV:         47 LV SV Index:   27 LVOT Area:     3.14 cm  RIGHT VENTRICLE             IVC RV S prime:     22.40 cm/s  IVC diam: 1.50 cm TAPSE (M-mode): 1.3 cm LEFT ATRIUM             Index        RIGHT ATRIUM           Index LA diam:        3.00 cm 1.73 cm/m  RA Area:     13.00 cm LA Vol (A2C):   36.9 ml 21.31 ml/m  RA Volume:   32.10 ml  18.53 ml/m LA Vol (A4C):   31.5 ml 18.19 ml/m LA Biplane Vol: 35.0 ml 20.21 ml/m  AORTIC VALVE AV Area (Vmax): 2.44 cm AV Vmax:        126.00 cm/s AV Peak Grad:   6.4 mmHg LVOT Vmax:      97.70 cm/s LVOT Vmean:     63.200 cm/s LVOT VTI:       0.150 m  AORTA Ao Root diam: 2.70 cm Ao Asc diam:  3.00 cm MITRAL VALVE               TRICUSPID VALVE MV Area  (PHT): 6.32 cm    TR Peak grad:   6.0 mmHg MV Decel Time: 120 msec    TR Vmax:        122.00 cm/s MV E velocity: 55.30 cm/s MV A velocity: 89.70 cm/s  SHUNTS MV E/A ratio:  0.62        Systemic VTI:  0.15 m                            Systemic Diam: 2.00 cm Redell Shallow MD Electronically signed by Redell Shallow MD Signature Date/Time: 05/01/2024/10:04:54 AM    Final    MR BRAIN WO CONTRAST Result Date: 04/30/2024 EXAM: MRI BRAIN WITHOUT CONTRAST 04/30/2024 07:01:38 PM TECHNIQUE: Multiplanar multisequence MRI of the head/brain was performed without the administration of intravenous contrast. COMPARISON: CT head 04/29/2024 and MRI head 03/01/2024. CLINICAL HISTORY: Mental status change, unknown cause. FINDINGS: BRAIN AND VENTRICLES: No acute infarct. No intracranial hemorrhage. No mass. No midline shift. No hydrocephalus. Scattered T2 and FLAIR hyperintensity in the periventricular and subcortical white matter compatible with mild chronic microvascular ischemic changes. Similar appearance of chronic encephalomalacia in the ventral medulla. Bilateral cerebellar remote infarcts. Similar small remote infarct along the right paramedian dorsal pons. Similar loss of the flow voids within the V4 segments of the vertebral arteries, right greater than left, as well as within the proximal basilar artery, similar to prior. Preserved flow void within the distal basilar artery, similar to prior. The sella is unremarkable. ORBITS: No acute abnormality. SINUSES AND MASTOIDS: Mucosal thickening in the ethmoid sinuses. Trace fluid in the left mastoid air cells. BONES AND SOFT TISSUES: Normal marrow signal. No acute soft tissue abnormality. IMPRESSION: 1. No acute intracranial abnormality. 2. Similar loss of flow voids within the V4 segments of the vertebral arteries, right greater than left, and within the proximal basilar artery. 3. 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 Remote infarcts in the pons and bilateral  cerebellum. Electronically signed by: Donnice Mania MD 04/30/2024 08:28 PM EST RP Workstation: [id number]   CT Angio Chest PE W and/or Wo Contrast Result Date: 04/30/2024 EXAM: CTA CHEST 04/30/2024 10:14:21 AM TECHNIQUE: CTA of the chest was performed without and with the administration of 75 mL of iohexol  (OMNIPAQUE ) 350 MG/ML injection. Multiplanar reformatted images are provided for review. MIP images are provided for review. Automated exposure control, iterative reconstruction, and/or weight based adjustment of the mA/kV was utilized to reduce the radiation dose to as low as reasonably achievable. COMPARISON: None available. CLINICAL HISTORY: Pulmonary embolism (PE) suspected, high prob. FINDINGS: PULMONARY ARTERIES: Pulmonary arteries are adequately opacified for evaluation. No acute pulmonary embolus. The main pulmonary artery measures at the upper limits of normal at 3.2 cm. The  limited evaluation of the subsegmental level is due to motion artifact. MEDIASTINUM: Aberrant right subclavian arteryis noted. The thoracic aorta is normal in caliber. The heart and pericardium demonstrate no acute abnormality other than cardiomegaly. LYMPH NODES: No mediastinal, hilar or axillary lymphadenopathy. LUNGS AND PLEURA: Bibasilar linear atelectasis versus scarring. Expiratory phase of respiration. Mosaic attenuation of the lungs. No focal consolidation or pulmonary edema. No evidence of pleural effusion or pneumothorax. UPPER ABDOMEN: Colonic diverticulosis. SOFT TISSUES AND BONES: Enlarged heterogeneous right thyroid  gland with underlying hyperdense and hypodense masses. No acute bone abnormality. Heterogenous appearance of the spine suggestive of osteopenia. IMPRESSION: 1. No pulmonary embolism. Limited evaluation at the subsegmental level due to motion artifact and adjacent lung disease. 2. No acute findings in the setting of expiratory phase of respiration and linear scarring/atelectasis. 3. Prominent pulmonary  artery size - correlate for pulmonary hypertension. 4. Cardiomegaly. 5. Enlarged heterogeneous right thyroid  gland with underlying masses; recommend non-emergent thyroid  ultrasound. Electronically signed by: Morgane Naveau MD 04/30/2024 11:58 AM EST RP Workstation: [id number]   DG Femur Portable Min 2 Views Left Result Date: 04/30/2024 EXAM: 2 VIEW(S) XRAY OF THE _LATERALITY_ FEMUR 04/30/2024 01:17:00 AM COMPARISON: None available. CLINICAL HISTORY: pain FINDINGS: BONES AND JOINTS: No acute fracture. No malalignment. SOFT TISSUES: Vascular calcifications. IMPRESSION: 1. No acute findings. Electronically signed by: Franky Stanford MD 04/30/2024 02:56 AM EST RP Workstation: [id number]   DG Chest Portable 1 View Result Date: 04/30/2024 EXAM: 1 VIEW(S) XRAY OF THE CHEST 04/30/2024 01:18:00 AM COMPARISON: 03/01/2024 CLINICAL HISTORY: pain FINDINGS: LUNGS AND PLEURA: Low lung volumes. Stable linear atelectasis or scarring in left mid lung. No pleural effusion. No pneumothorax. HEART AND MEDIASTINUM: Aortic atherosclerosis. No acute abnormality of the cardiac silhouette. BONES AND SOFT TISSUES: No acute osseous abnormality. IMPRESSION: 1. Stable linear atelectasis or scarring in left mid lung. 2. Low lung volumes. Electronically signed by: Franky Stanford MD 04/30/2024 02:56 AM EST RP Workstation: [id number]   DG Femur Portable Min 2 Views Right Result Date: 04/30/2024 EXAM: 2 VIEW(S) XRAY OF THE FEMUR 04/30/2024 01:14:00 AM COMPARISON: None available. CLINICAL HISTORY: pain FINDINGS: BONES AND JOINTS: No acute fracture. No malalignment. SOFT TISSUES: Scattered femoral arterial calcifications. IMPRESSION: 1. No acute findings. Electronically signed by: Franky Stanford MD 04/30/2024 02:55 AM EST RP Workstation: [id number]   CT ABDOMEN PELVIS WO CONTRAST Result Date: 04/29/2024 EXAM: CT ABDOMEN AND PELVIS WITHOUT CONTRAST 04/29/2024 07:26:02 PM TECHNIQUE: CT of the abdomen and pelvis was performed without the  administration of intravenous contrast. Multiplanar reformatted images are provided for review. Automated exposure control, iterative reconstruction, and/or weight-based adjustment of the mA/kV was utilized to reduce the radiation dose to as low as reasonably achievable. COMPARISON: Findings are compared to 10/04/2022. CLINICAL HISTORY: Abdominal pain, acute, nonlocalized. Chronic lymphocytic leukemia. Tracking code: Bo. FINDINGS: LOWER CHEST: No acute abnormality. LIVER: The liver is unremarkable. GALLBLADDER AND BILE DUCTS: Gallbladder is unremarkable. No biliary ductal dilatation. SPLEEN: No acute abnormality. PANCREAS: No acute abnormality. ADRENAL GLANDS: No acute abnormality. KIDNEYS, URETERS AND BLADDER: Right kidney: Simple cortical cyst noted within the lower pole of the right kidney for which no follow up imaging is recommended. Left kidney: Left lower pole exophytic low attenuation lesion was previously characterized as a simple cyst and demonstrates interval decrease in size in keeping with an involuting cyst. Again no follow up imaging is recommended for this lesion. Per consensus, no follow-up is needed for simple Bosniak type 1 and 2 renal cysts, unless the patient has a malignancy history or risk  factors. Mild nonspecific bilateral perinephric stranding is unchanged. No stones in the kidneys or ureters. No hydronephrosis. No periureteral stranding. Urinary bladder: Gas within the bladder lumen is nonspecific, possibly related to recent catheterization. Urosepsis. GI AND BOWEL: Stomach demonstrates no acute abnormality. Appendix normal. The small bowel and large bowel are otherwise unremarkable. There is no bowel obstruction. PERITONEUM AND RETROPERITONEUM: No ascites. No free air. VASCULATURE: Aorta is normal in caliber. No aortic aneurysm. Mild aortoiliac atherosclerotic calcification. LYMPH NODES: Previously noted pathologic retroperitoneal and pelvic adenopathy has resolved. No pathologic residual  adenopathy is seen within the abdomen and pelvis. REPRODUCTIVE ORGANS: No adnexal masses. BONES AND SOFT TISSUES: The osseous structures are diffusely osteopenic, progressive since prior examination. Tiny fat-containing umbilical hernia. IMPRESSION: 1. No acute findings in the abdomen or pelvis to explain abdominal pain. 2. Resolution of previously noted pathologic retroperitoneal and pelvic adenopathy, with no pathologic residual adenopathy in the abdomen or pelvis. 3. Gas within the bladder lumen, possibly related to recent catheterization. Correlation with clinical history is recommended. 4. Diffuse osteopenia, progressive since prior examination Electronically signed by: Dorethia Molt MD 04/29/2024 09:54 PM EST RP Workstation: [id number]   CT Head Wo Contrast Result Date: 04/29/2024 EXAM: CT HEAD WITHOUT CONTRAST 04/29/2024 07:26:02 PM TECHNIQUE: CT of the head was performed without the administration of intravenous contrast. Automated exposure control, iterative reconstruction, and/or weight based adjustment of the mA/kV was utilized to reduce the radiation dose to as low as reasonably achievable. COMPARISON: 03/01/2024 CLINICAL HISTORY: Mental status change, unknown cause FINDINGS: BRAIN AND VENTRICLES: No acute hemorrhage. No evidence of acute infarct. No hydrocephalus. No extra-axial collection. No mass effect or midline shift. Moderate vascular calcifications within the carotid siphons. ORBITS: No acute abnormality. SINUSES: No acute abnormality. SOFT TISSUES AND SKULL: No acute soft tissue abnormality. No skull fracture. IMPRESSION: 1. No acute intracranial abnormality. Electronically signed by: Franky Stanford MD 04/29/2024 08:34 PM EST RP Workstation: [id number]

## 2024-05-01 NOTE — Evaluation (Signed)
 Speech Language Pathology Evaluation Patient Details Name: [name] MRN: [id number] DOB: 23-Jan-1961 Today's Date: 05/01/2024 Time: 1430-1455 SLP Time Calculation (min) (ACUTE ONLY): 25 min  Problem List:  Patient Active Problem List   Diagnosis Date Noted   Acute metabolic encephalopathy 04/30/2024   Disorientation 03/01/2024   Protein-calorie malnutrition, severe 10/08/2022   Severe sepsis (HCC) 10/05/2022   Dehydration 10/05/2022   Acute prerenal azotemia 10/05/2022   Proctitis 10/05/2022   Hypercalcemia 10/05/2022   Transaminitis 10/05/2022   Pyuria 10/05/2022   Depression 10/05/2022   CAP (community acquired pneumonia) 10/04/2022   Malnutrition of moderate degree 09/26/2022   SOB (shortness of breath) 09/25/2022   Nausea vomiting and diarrhea 09/24/2022   Shortness of breath 09/24/2022   Acute respiratory failure with hypoxia (HCC) 09/24/2022   LFT elevation 09/24/2022   Diabetes mellitus (HCC) 09/06/2022   Rectal ulcer 07/09/2022   Colon ulcer 07/08/2022   Rectal bleeding 07/07/2022   Anemia 07/07/2022   GI bleed 07/06/2022   Dysarthria 06/12/2022   Aspiration pneumonia (HCC) 06/12/2022   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   Anxiety disorder due to medical condition 06/10/2022   AKI (acute kidney injury) 06/01/2022   Dizziness 05/29/2022   Personal history of CLL (chronic lymphocytic leukemia) 05/29/2022   Rectal pain 05/29/2022   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 disease, with long-term current use of insulin  (HCC) 04/14/2022   Type 2 diabetes mellitus with diabetic polyneuropathy, with long-term current use of insulin  (HCC) 04/14/2022   Antiplatelet or antithrombotic long-term use 03/16/2022   HLD (hyperlipidemia) 10/01/2021   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HCC) 09/17/2021   Stage 3a chronic kidney disease (CKD) (HCC) 09/16/2021   Thyroid  nodule 09/16/2021   Cervical lymphadenopathy 09/16/2021   Acute CVA (cerebrovascular  accident) (HCC) 09/15/2021   GERD (gastroesophageal reflux disease) 04/30/2021   Peripheral arterial disease 04/13/2021   Atherosclerosis of aorta 02/14/2021   Cerebral thrombosis with cerebral infarction 02/08/2021   Abnormal MRI of head 02/07/2021   Vertigo 02/07/2021   Class 1 obesity due to excess calories with body mass index (BMI) of 31.0 to 31.9 in adult 02/07/2021   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   Trigger finger, left ring finger 10/04/2018   Alternating constipation and diarrhea 04/20/2018   Diabetic peripheral neuropathy associated with type 2 diabetes mellitus (HCC) 02/26/2018   DM2 (diabetes mellitus, type 2) (HCC) 07/29/2015   ABNORMALITY OF GAIT 05/10/2010   ECZEMA, ATOPIC 04/03/2009   Hyperlipidemia associated with type 2 diabetes mellitus (HCC) 03/06/2009   BACK PAIN 11/14/2008   Hemiparesis affecting left side as late effect of stroke (HCC) 08/05/2008   DIPLOPIA 07/15/2008   Essential hypertension 07/15/2008   Past Medical History:  Past Medical History:  Diagnosis Date   Abnormality of gait 05/10/2010   BACK PAIN 11/14/2008   Class 1 obesity due to excess calories with body mass index (BMI) of 31.0 to 31.9 in adult 02/07/2021   DIABETES MELLITUS, TYPE II 07/15/2008   Diplopia 07/15/2008   ECZEMA, ATOPIC 04/03/2009   GERD (gastroesophageal reflux disease)    Guillain-Barre 1988   HYPERLIPIDEMIA 03/06/2009   HYPERTENSION 07/15/2008   Stroke (HCC) 2010, 2011   x2    Vertebral artery stenosis    Past Surgical History:  Past Surgical History:  Procedure Laterality Date   ABDOMINAL HYSTERECTOMY     BIOPSY  07/08/2022   Procedure: BIOPSY;  Surgeon: Leigh Elspeth SQUIBB, MD;  Location: [facility];  Service: Gastroenterology;;   BIOPSY  10/12/2022   Procedure: BIOPSY;  Surgeon: San Sandor GAILS, DO;  Location: [facility];  Service: Gastroenterology;;   COLONOSCOPY WITH PROPOFOL  N/A 07/08/2022   Procedure: COLONOSCOPY WITH PROPOFOL ;  Surgeon:  Leigh Elspeth SQUIBB, MD;  Location: [facility];  Service: Gastroenterology;  Laterality: N/A;   DILATION AND CURETTAGE OF UTERUS     FLEXIBLE SIGMOIDOSCOPY N/A 10/12/2022   Procedure: FLEXIBLE SIGMOIDOSCOPY;  Surgeon: San Sandor GAILS, DO;  Location: [facility];  Service: Gastroenterology;  Laterality: N/A;   FOOT SURGERY     IR ANGIO INTRA EXTRACRAN SEL COM CAROTID INNOMINATE UNI L MOD SED  09/17/2021   IR ANGIO INTRA EXTRACRAN SEL INTERNAL CAROTID UNI R MOD SED  09/17/2021   IR ANGIO VERTEBRAL SEL VERTEBRAL UNI R MOD SED  09/17/2021   IR US  GUIDE VASC ACCESS RIGHT  09/17/2021   HPI:  Patient is a 63 y.o.  female with history of CVA-left-sided residual hemiparesis, HTN, HLD, DM-2, non-Hodgkin's lymphoma-who was brought to the ED from SNF for altered mental status/B/L lower extremity pain. CXR revealed no PNA, MRI revealed no intracranial abnormality. Urine culture pending.   Assessment / Plan / Recommendation Clinical Impression  Patient was evaluated via the Cognistat and informal means to assess cognitive linguistic skills. Patient oriented to self, location and time, though unsure details of medication situation. Patient perseverative on feeling like something is eating her flesh and feeling worried she has a flesh eating virus. SLP provided encouragement that the medical staff is doing everything they can to assist her. Per standardized assessment, patient with functional expressive/receptive language and moderate deficits in immediate/delayed recall and problem solving. Patient with halting fluent speech, though this seems baseline per last ST notes. Patient would benefit from f/u ST services at the acute care level to target cognitive skills.     SLP Assessment  SLP Recommendation/Assessment: Patient needs continued Speech Language Pathology Services SLP Visit Diagnosis: Cognitive communication deficit (R41.841)     Assistance Recommended at Discharge  Frequent or constant  Supervision/Assistance  Functional Status Assessment Patient has had a recent decline in their functional status and/or demonstrates limited ability to make significant improvements in function in a reasonable and predictable amount of time (? baseline)  Frequency and Duration min 2x/week  2 weeks      SLP Evaluation Cognition  Overall Cognitive Status: No family/caregiver present to determine baseline cognitive functioning Arousal/Alertness: Awake/alert Orientation Level: Oriented to person;Oriented to place;Oriented to time;Disoriented to situation Year: 2025 Month: December Day of Week: Correct Attention: Sustained Sustained Attention: Impaired Sustained Attention Impairment: Verbal basic Memory: Impaired Memory Impairment: Decreased recall of new information;Storage deficit Awareness: Impaired Awareness Impairment: Intellectual impairment Problem Solving: Impaired Problem Solving Impairment: Verbal basic;Functional basic       Comprehension  Auditory Comprehension Overall Auditory Comprehension: Appears within functional limits for tasks assessed    Expression Expression Primary Mode of Expression: Verbal Verbal Expression Overall Verbal Expression: Appears within functional limits for tasks assessed Initiation: No impairment Repetition: No impairment Naming: No impairment   Oral / Motor  Motor Speech Overall Motor Speech: Impaired at baseline (halting, reduced intelligibility) Respiration: Within functional limits Phonation: Normal Articulation: Impaired Level of Impairment: Phrase Intelligibility: Intelligibility reduced Phrase: 50-74% accurate Interfering Components: Premorbid status            Saeed Toren M.A., CCC-SLP 05/01/2024, 3:08 PM

## 2024-05-01 NOTE — Assessment & Plan Note (Signed)
 Resolved Continue UTI treatment per primary team.  Appreciate management

## 2024-05-02 DIAGNOSIS — G9341 Metabolic encephalopathy: Secondary | ICD-10-CM | POA: Diagnosis not present

## 2024-05-02 DIAGNOSIS — N3000 Acute cystitis without hematuria: Secondary | ICD-10-CM | POA: Diagnosis not present

## 2024-05-02 DIAGNOSIS — I10 Essential (primary) hypertension: Secondary | ICD-10-CM | POA: Diagnosis not present

## 2024-05-02 LAB — URINE CULTURE: Culture: >=100000 — AB

## 2024-05-02 LAB — BASIC METABOLIC PANEL WITH GFR
Anion gap: 14 (ref 5–15)
BUN: 20 mg/dL (ref 8–23)
CO2: 23 mmol/L (ref 22–32)
Calcium: 10.4 mg/dL — ABNORMAL HIGH (ref 8.9–10.3)
Chloride: 106 mmol/L (ref 98–111)
Creatinine, Ser: 1.02 mg/dL — ABNORMAL HIGH (ref 0.44–1.00)
GFR, Estimated: >60 mL/min
Glucose, Bld: 152 mg/dL — ABNORMAL HIGH (ref 70–99)
Potassium: 3.4 mmol/L — ABNORMAL LOW (ref 3.5–5.1)
Sodium: 143 mmol/L (ref 135–145)

## 2024-05-02 LAB — CBC
HCT: 31.5 % — ABNORMAL LOW (ref 36.0–46.0)
Hemoglobin: 10.7 g/dL — ABNORMAL LOW (ref 12.0–15.0)
MCH: 27.3 pg (ref 26.0–34.0)
MCHC: 34 g/dL (ref 30.0–36.0)
MCV: 80.4 fL (ref 80.0–100.0)
Platelets: 225 K/uL (ref 150–400)
RBC: 3.92 MIL/uL (ref 3.87–5.11)
RDW: 15.8 % — ABNORMAL HIGH (ref 11.5–15.5)
WBC: 5.4 K/uL (ref 4.0–10.5)
nRBC: 0 % (ref 0.0–0.2)

## 2024-05-02 LAB — GLUCOSE, CAPILLARY
Glucose-Capillary: 142 mg/dL — ABNORMAL HIGH (ref 70–99)
Glucose-Capillary: 156 mg/dL — ABNORMAL HIGH (ref 70–99)
Glucose-Capillary: 147 mg/dL — ABNORMAL HIGH (ref 70–99)
Glucose-Capillary: 236 mg/dL — ABNORMAL HIGH (ref 70–99)
Glucose-Capillary: 223 mg/dL — ABNORMAL HIGH (ref 70–99)

## 2024-05-02 MED ORDER — OXYCODONE HCL 5 MG PO TABS
5.0000 mg | ORAL_TABLET | Freq: Four times a day (QID) | ORAL | Status: DC | PRN
  Administered 2024-05-02: 5 mg via ORAL
  Filled 2024-05-02: qty 1

## 2024-05-02 MED ORDER — POTASSIUM CHLORIDE 20 MEQ PO PACK: 20.0000 meq | PACK | Freq: Once | ORAL | Status: DC

## 2024-05-02 MED ORDER — METHOCARBAMOL 500 MG PO TABS
500.0000 mg | ORAL_TABLET | Freq: Three times a day (TID) | ORAL | Status: DC | PRN
  Administered 2024-05-02: 500 mg via ORAL
  Filled 2024-05-02: qty 1

## 2024-05-02 MED ORDER — CARVEDILOL 12.5 MG PO TABS
12.5000 mg | ORAL_TABLET | Freq: Two times a day (BID) | ORAL | Status: DC
  Administered 2024-05-02 – 2024-05-03 (×2): 12.5 mg via ORAL
  Filled 2024-05-02 (×2): qty 1

## 2024-05-02 MED ORDER — POTASSIUM CHLORIDE CRYS ER 20 MEQ PO TBCR
20.0000 meq | EXTENDED_RELEASE_TABLET | ORAL | Status: AC
  Administered 2024-05-02: 20 meq via ORAL
  Filled 2024-05-02: qty 1

## 2024-05-02 MED ORDER — POTASSIUM CHLORIDE CRYS ER 20 MEQ PO TBCR: 20.0000 meq | EXTENDED_RELEASE_TABLET | Freq: Two times a day (BID) | ORAL | Status: DC

## 2024-05-02 MED ORDER — CARVEDILOL 6.25 MG PO TABS
6.2500 mg | ORAL_TABLET | Freq: Two times a day (BID) | ORAL | Status: DC
  Administered 2024-05-02: 6.25 mg via ORAL
  Filled 2024-05-02: qty 1

## 2024-05-02 MED ORDER — GABAPENTIN 100 MG PO CAPS
100.0000 mg | ORAL_CAPSULE | Freq: Three times a day (TID) | ORAL | Status: DC
  Administered 2024-05-02 (×3): 100 mg via ORAL
  Filled 2024-05-02 (×3): qty 1

## 2024-05-02 MED ORDER — POTASSIUM CHLORIDE CRYS ER 20 MEQ PO TBCR
20.0000 meq | EXTENDED_RELEASE_TABLET | Freq: Once | ORAL | Status: AC
  Administered 2024-05-02: 20 meq via ORAL
  Filled 2024-05-02: qty 1

## 2024-05-02 NOTE — Progress Notes (Addendum)
 "                        PROGRESS NOTE        PATIENT DETAILS Name: [name] Age: 64 y.o. Sex: female Date of Birth: 1960/11/24 Admit Date: 04/29/2024 Admitting Physician Eva KATHEE Pore, DO ERE:[email or web address], Silvano, DO  Brief Summary: Patient is a 64 y.o.  female with history of CVA-left-sided residual hemiparesis, HTN, HLD, DM-2, non-Hodgkin's lymphoma-who was brought to the ED from SNF for altered mental status/B/L lower extremity pain.  Significant events: 12/30>> admit to [facility]  Significant studies: 12/29>> CT abdomen/pelvis: No acute abnormalities. 12/29>> CT head: No acute abnormality 12/30>> right femur x-ray: No acute findings 12/30>> left femur x-ray: No acute findings 12/30>> chest x-ray: No PNA 12/30>> CTA chest: No PE 12/30>> MRI brain: No acute intracranial abnormality 12/31>> echo: EF> 75%, grade 1 diastolic dysfunction 12/31>> B/L lower extremity Doppler: No DVT.  Significant microbiology data: 12/30>> urine culture: E. coli  Procedures: None  Consults: None  Subjective: Claims to have something coming out of her body-somewhat confused but easily redirectable.  Continues to complain of pain in her lower extremities bilaterally.  Objective: Vitals: Blood pressure (!) 185/91, pulse 87, temperature 98 F (36.7 C), temperature source Oral, resp. rate 13, height 5' 4 (1.626 m), weight 68.1 kg, SpO2 (!) 88%.   Exam: Gen Exam:Alert awake-not in any distress HEENT:atraumatic, normocephalic Chest: B/L clear to auscultation anteriorly CVS:S1S2 regular Abdomen:soft non tender, non distended Extremities: Trace edema-appears to have neuropathic pain-both legs are warm to touch. Neurology: Chronic left-sided hemiplegia Skin: no rash  Pertinent Labs/Radiology:    Latest Ref Rng & Units 05/02/2024    3:42 AM 05/01/2024    3:36 AM 04/30/2024    7:58 AM  CBC  WBC 4.0 - 10.5 K/uL 5.4  6.0  4.4   Hemoglobin 12.0 - 15.0 g/dL 89.2  88.7  89.2   Hematocrit  36.0 - 46.0 % 31.5  34.1  32.3   Platelets 150 - 400 K/uL 225  238  215     Lab Results  Component Value Date   NA 143 05/02/2024   K 3.4 (L) 05/02/2024   CL 106 05/02/2024   CO2 23 05/02/2024      Assessment/Plan: Acute metabolic encephalopathy Thought to be secondary to complicated UTI Overall improved-somewhat restless/confused this morning but redirectable Continue IV Rocephin  Minimize narcotics as much as possible.  Complicated UTI-E. coli Continue Rocephin  Note-patient immunocompromised-on chemotherapy for small lymphocytic lymphoma  B/L lower extremity pain Do not see any wounds Discussed with sister over the phone on 1/1-patient has been having bilateral lower extremity pain for almost a year since her CVA This could be related to neuropathy-probably from diabetes Since mentation has improved-Will begin trial of Neurontin -low-dose-see how she does.  Hypertensive urgency BP gradually stabilizing Increase Coreg to 12.5 mg twice daily Continue HCTZ/amlodipine  and transdermal clonidine  Brief run of SVT on 1/1 Already on beta-blocker-dosage increased today-see above.  History of CVA with left hemiparesis At baseline MRI brain negative for acute CVA Continue aspirin /statin/Zetia   DM-2 (A1c 7.6 on November 2025) CBG stable Continue SSI Resume oral hypoglycemic agents on discharge.  Recent Labs    05/01/24 2113 05/02/24 0719 05/02/24 0932  GLUCAP 131* 142* 156*     History of hypothyroidism TSH/free T4 stable-no longer on methimazole  (per pharmacy-methimazole  last filled October/November)   GERD PPI  Mood disorder Prozac /BuSpar   Small lymphocytic lymphoma Follows with ecology-Dr Onesimo Resume  venetoclax  (Pharmacy trying to get it from SNF)  Heterogeneous enlarged right thyroid  gland Incidental finding on CT chest-stable for outpatient follow-up with PCP for nonemergent thyroid  ultrasound.  Chronic debility/deconditioning Unclear functional  status-poor historian-appears to have significant left-sided hemiplegia from prior CVA. SNF resident PT/OT eval-SNF recommended.  Code status:   Code Status: Full Code   DVT Prophylaxis: heparin  injection 5,000 Units Start: 04/30/24 2200 SCD's Start: 04/30/24 1814 SCDs Start: 04/30/24 0433   Family Communication: Sister-Brenda-[phone] updated 1/1   Disposition Plan: Status is: Observation The patient will require care spanning > 2 midnights and should be moved to inpatient because: Severity of illness   Planned Discharge Destination:Skilled nursing facility   Diet: Diet Order             Diet heart healthy/carb modified Room service appropriate? Yes; Fluid consistency: Thin  Diet effective now                     Antimicrobial agents: Anti-infectives (From admission, onward)    Start     Dose/Rate Route Frequency Ordered Stop   04/30/24 2200  cefTRIAXone  (ROCEPHIN ) 1 g in sodium chloride  0.9 % 100 mL IVPB        1 g 200 mL/hr over 30 Minutes Intravenous Every 24 hours 04/30/24 0436     04/30/24 0215  cefTRIAXone  (ROCEPHIN ) 1 g in sodium chloride  0.9 % 100 mL IVPB        1 g 200 mL/hr over 30 Minutes Intravenous  Once 04/30/24 0206 04/30/24 0244        MEDICATIONS: Scheduled Meds:  amLODipine   10 mg Oral Daily   aspirin  EC  81 mg Oral Daily   atorvastatin   80 mg Oral Daily   busPIRone   10 mg Oral BID   carvedilol  12.5 mg Oral BID WC   [START ON 05/04/2024] cloNIDine  0.1 mg Transdermal Q Sat   ezetimibe   10 mg Oral Daily   FLUoxetine   40 mg Oral Daily   gabapentin   100 mg Oral TID   heparin  injection (subcutaneous)  5,000 Units Subcutaneous Q8H   hydrochlorothiazide   12.5 mg Oral Daily   insulin  aspart  0-9 Units Subcutaneous TID WC   pantoprazole  (PROTONIX ) IV  40 mg Intravenous Q12H   potassium chloride   20 mEq Oral Once   venetoclax   200 mg Oral Daily   Continuous Infusions:  cefTRIAXone  (ROCEPHIN )  IV 1 g (05/01/24 2154)   PRN  Meds:.acetaminophen  **OR** acetaminophen , labetalol , melatonin, ondansetron  (ZOFRAN ) IV   I have personally reviewed following labs and imaging studies  LABORATORY DATA: CBC: Recent Labs  Lab 04/29/24 1904 04/29/24 1909 04/30/24 0758 05/01/24 0336 05/02/24 0342  WBC 6.4  --  4.4 6.0 5.4  NEUTROABS 4.8  --  2.7  --   --   HGB 11.7* 11.9* 10.7* 11.2* 10.7*  HCT 35.9* 35.0* 32.3* 34.1* 31.5*  MCV 83.3  --  82.6 82.6 80.4  PLT 254  --  215 238 225    Basic Metabolic Panel: Recent Labs  Lab 04/29/24 1904 04/29/24 1909 04/30/24 0758 05/01/24 0336 05/02/24 0342  NA 143 144 144 142 143  K 3.7 3.6 3.3* 3.7 3.4*  CL 102 102 108 105 106  CO2 28  --  27 22 23   GLUCOSE 98 103* 95 98 152*  BUN 25* 25* 22 21 20   CREATININE 0.82 0.90 0.74 0.94 1.02*  CALCIUM  10.9*  --  10.0 10.4* 10.4*  MG  --   --  1.7 1.8  --   PHOS  --   --  2.7  --   --     GFR: Estimated Creatinine Clearance: 52.9 mL/min (A) (by C-G formula based on SCr of 1.02 mg/dL (H)).  Liver Function Tests: Recent Labs  Lab 04/29/24 1904 04/30/24 0758 05/01/24 0336  AST 26 23 29   ALT 22 20 24   ALKPHOS 80 71 79  BILITOT 0.4 0.3 0.4  PROT 7.9 6.9 7.4  ALBUMIN 4.0 3.5 3.7   No results for input(s): LIPASE, AMYLASE in the last 168 hours. No results for input(s): AMMONIA in the last 168 hours.  Coagulation Profile: No results for input(s): INR, PROTIME in the last 168 hours.  Cardiac Enzymes: Recent Labs  Lab 04/30/24 0250  CKTOTAL 96    BNP (last 3 results) Recent Labs    04/30/24 0946  PROBNP 1,432.0*    Lipid Profile: Recent Labs    05/01/24 0336  CHOL 138  HDL 51  LDLCALC 70  TRIG 87  CHOLHDL 2.7    Thyroid  Function Tests: Recent Labs    04/30/24 0758 04/30/24 1407  TSH 0.498  --   FREET4  --  1.46    Anemia Panel: Recent Labs    04/30/24 0758 04/30/24 1407 04/30/24 2029  VITAMINB12  --   --  1,694*  FOLATE  --   --  >20.0  FERRITIN  --  421*  --   TIBC  --   287  --   IRON  --  44  --   RETICCTPCT 1.7  --   --     Urine analysis:    Component Value Date/Time   COLORURINE YELLOW 04/30/2024 0112   APPEARANCEUR CLOUDY (A) 04/30/2024 0112   LABSPEC 1.009 04/30/2024 0112   PHURINE 7.0 04/30/2024 0112   GLUCOSEU NEGATIVE 04/30/2024 0112   HGBUR SMALL (A) 04/30/2024 0112   HGBUR negative 03/12/2010 0847   BILIRUBINUR NEGATIVE 04/30/2024 0112   BILIRUBINUR n 09/11/2015 1039   KETONESUR NEGATIVE 04/30/2024 0112   PROTEINUR >=300 (A) 04/30/2024 0112   UROBILINOGEN 1.0 09/11/2015 1039   UROBILINOGEN 1.0 05/19/2014 1858   NITRITE NEGATIVE 04/30/2024 0112   LEUKOCYTESUR SMALL (A) 04/30/2024 0112    Sepsis Labs: Lactic Acid, Venous    Component Value Date/Time   LATICACIDVEN 1.3 04/30/2024 1636    MICROBIOLOGY: Recent Results (from the past 240 hours)  Urine Culture     Status: Abnormal   Collection Time: 04/30/24  1:12 AM   Specimen: Urine, Random  Result Value Ref Range Status   Specimen Description URINE, RANDOM  Final   Special Requests   Final    NONE Reflexed from [id number] Performed at [facility], [address], [place], [place] [address]    Culture >=100,000 COLONIES/mL ESCHERICHIA COLI (A)  Final   Report Status 05/02/2024 FINAL  Final   Organism ID, Bacteria ESCHERICHIA COLI (A)  Final      Susceptibility   Escherichia coli - MIC*    AMPICILLIN  <=2 SENSITIVE Sensitive     CEFAZOLIN (URINE) Value in next row Sensitive      <=1 SENSITIVEThis is a modified FDA-approved test that has been validated and its performance characteristics determined by the reporting laboratory.  This laboratory is certified under the Clinical Laboratory Improvement Amendments CLIA as qualified to perform high complexity clinical laboratory testing.    CEFEPIME Value in next row Sensitive      <=1 SENSITIVEThis is a modified FDA-approved test  that has been validated and its performance characteristics determined by the reporting laboratory.   This laboratory is certified under the Clinical Laboratory Improvement Amendments CLIA as qualified to perform high complexity clinical laboratory testing.    ERTAPENEM Value in next row Sensitive      <=1 SENSITIVEThis is a modified FDA-approved test that has been validated and its performance characteristics determined by the reporting laboratory.  This laboratory is certified under the Clinical Laboratory Improvement Amendments CLIA as qualified to perform high complexity clinical laboratory testing.    CEFTRIAXONE  Value in next row Sensitive      <=1 SENSITIVEThis is a modified FDA-approved test that has been validated and its performance characteristics determined by the reporting laboratory.  This laboratory is certified under the Clinical Laboratory Improvement Amendments CLIA as qualified to perform high complexity clinical laboratory testing.    CIPROFLOXACIN  Value in next row Resistant      <=1 SENSITIVEThis is a modified FDA-approved test that has been validated and its performance characteristics determined by the reporting laboratory.  This laboratory is certified under the Clinical Laboratory Improvement Amendments CLIA as qualified to perform high complexity clinical laboratory testing.    GENTAMICIN Value in next row Sensitive      <=1 SENSITIVEThis is a modified FDA-approved test that has been validated and its performance characteristics determined by the reporting laboratory.  This laboratory is certified under the Clinical Laboratory Improvement Amendments CLIA as qualified to perform high complexity clinical laboratory testing.    NITROFURANTOIN Value in next row Sensitive      <=1 SENSITIVEThis is a modified FDA-approved test that has been validated and its performance characteristics determined by the reporting laboratory.  This laboratory is certified under the Clinical Laboratory Improvement Amendments CLIA as qualified to perform high complexity clinical laboratory testing.     TRIMETH/SULFA Value in next row Sensitive      <=1 SENSITIVEThis is a modified FDA-approved test that has been validated and its performance characteristics determined by the reporting laboratory.  This laboratory is certified under the Clinical Laboratory Improvement Amendments CLIA as qualified to perform high complexity clinical laboratory testing.    AMPICILLIN /SULBACTAM Value in next row Sensitive      <=1 SENSITIVEThis is a modified FDA-approved test that has been validated and its performance characteristics determined by the reporting laboratory.  This laboratory is certified under the Clinical Laboratory Improvement Amendments CLIA as qualified to perform high complexity clinical laboratory testing.    PIP/TAZO Value in next row Sensitive      <=4 SENSITIVEThis is a modified FDA-approved test that has been validated and its performance characteristics determined by the reporting laboratory.  This laboratory is certified under the Clinical Laboratory Improvement Amendments CLIA as qualified to perform high complexity clinical laboratory testing.    MEROPENEM Value in next row Sensitive      <=4 SENSITIVEThis is a modified FDA-approved test that has been validated and its performance characteristics determined by the reporting laboratory.  This laboratory is certified under the Clinical Laboratory Improvement Amendments CLIA as qualified to perform high complexity clinical laboratory testing.    * >=100,000 COLONIES/mL ESCHERICHIA COLI    RADIOLOGY STUDIES/RESULTS: VAS US  LOWER EXTREMITY VENOUS (DVT) Result Date: 05/01/2024  Lower Venous DVT Study Patient Name:  [name]  Date of Exam:   05/01/2024 Medical Rec #: [id number]        Accession #:    [id number] Date of Birth: 18-Jun-1960        Patient Gender:  F Patient Age:   52 years [facility] Procedure:      VAS US  LOWER EXTREMITY VENOUS (DVT) Referring Phys: EKTA PATEL  --------------------------------------------------------------------------------  Indications: History of CVA with residual left sided weakness/hemiparesis and dysarthria, Swelling, and Pain.  Risk Factors: DM, HTN, HLD. Comparison Study: No priors. Performing Technologist: Ricka Sturdivant-Jones RDMS, RVT  Examination Guidelines: A complete evaluation includes B-mode imaging, spectral Doppler, color Doppler, and power Doppler as needed of all accessible portions of each vessel. Bilateral testing is considered an integral part of a complete examination. Limited examinations for reoccurring indications may be performed as noted. The reflux portion of the exam is performed with the patient in reverse Trendelenburg.  +---------+---------------+---------+-----------+----------+-------------------+ RIGHT    CompressibilityPhasicitySpontaneityPropertiesThrombus Aging      +---------+---------------+---------+-----------+----------+-------------------+ CFV      Full           Yes      Yes                                      +---------+---------------+---------+-----------+----------+-------------------+ SFJ      Full                                                             +---------+---------------+---------+-----------+----------+-------------------+ FV Prox  Full                                                             +---------+---------------+---------+-----------+----------+-------------------+ FV Mid   Full           Yes      Yes                                      +---------+---------------+---------+-----------+----------+-------------------+ FV DistalFull                                                             +---------+---------------+---------+-----------+----------+-------------------+ PFV      Full                                                             +---------+---------------+---------+-----------+----------+-------------------+ POP       Full           Yes      Yes                                      +---------+---------------+---------+-----------+----------+-------------------+ PTV      Full                                                             +---------+---------------+---------+-----------+----------+-------------------+  PERO                                                  not well visualized +---------+---------------+---------+-----------+----------+-------------------+   +---------+---------------+---------+-----------+----------+--------------+ LEFT     CompressibilityPhasicitySpontaneityPropertiesThrombus Aging +---------+---------------+---------+-----------+----------+--------------+ CFV      Full           Yes      Yes                                 +---------+---------------+---------+-----------+----------+--------------+ SFJ      Full                                                        +---------+---------------+---------+-----------+----------+--------------+ FV Prox  Full                                                        +---------+---------------+---------+-----------+----------+--------------+ FV Mid   Full           Yes      Yes                                 +---------+---------------+---------+-----------+----------+--------------+ FV DistalFull                                                        +---------+---------------+---------+-----------+----------+--------------+ PFV      Full                                                        +---------+---------------+---------+-----------+----------+--------------+ POP      Full           Yes      Yes                                 +---------+---------------+---------+-----------+----------+--------------+ PTV      Full                                                        +---------+---------------+---------+-----------+----------+--------------+ PERO     Full                                                         +---------+---------------+---------+-----------+----------+--------------+  Summary: RIGHT: - There is no evidence of deep vein thrombosis in the lower extremity. However, portions of this examination were limited- see technologist comments above.  - No cystic structure found in the popliteal fossa.  LEFT: - There is no evidence of deep vein thrombosis in the lower extremity.  - No cystic structure found in the popliteal fossa.  *See table(s) above for measurements and observations. Electronically signed by Penne Colorado MD on 05/01/2024 at 5:02:20 PM.    Final    ECHOCARDIOGRAM COMPLETE BUBBLE STUDY Result Date: 05/01/2024    ECHOCARDIOGRAM REPORT   Patient Name:   [name] Date of Exam: 05/01/2024 Medical Rec #:  [id number]       Height:       64.0 in Accession #:    [id number]      Weight:       150.1 lb Date of Birth:  Jan 03, 1961       BSA:          1.732 m Patient Age:    63 years        BP:           142/92 mmHg Patient Gender: F               HR:           108 bpm. Exam Location:  Inpatient Procedure: 2D Echo, Cardiac Doppler, Color Doppler, Intracardiac Opacification            Agent and Saline Contrast Bubble Study (Both Spectral and Color Flow            Doppler were utilized during procedure). Indications:    Stroke  History:        Patient has prior history of Echocardiogram examinations, most                 recent 05/31/2022. Risk Factors:Hypertension, Diabetes and                 Dyslipidemia.  Sonographer:    Sherlean Dubin Referring Phys: [id number] EKTA V PATEL  Sonographer Comments: Suboptimal apical window. Image acquisition challenging due to patient body habitus and Image acquisition challenging due to respiratory motion. IMPRESSIONS  1. Technically difficult; saline microcavitation study not interpretable.  2. Left ventricular ejection fraction, by estimation, is >75%. The left ventricle has hyperdynamic function. The left ventricle has  no regional wall motion abnormalities. There is moderate left ventricular hypertrophy. Left ventricular diastolic parameters are consistent with Grade I diastolic dysfunction (impaired relaxation).  3. Right ventricular systolic function is normal. The right ventricular size is normal.  4. The mitral valve is normal in structure. No evidence of mitral valve regurgitation. No evidence of mitral stenosis.  5. The aortic valve is tricuspid. Aortic valve regurgitation is not visualized. No aortic stenosis is present.  6. The inferior vena cava is normal in size with greater than 50% respiratory variability, suggesting right atrial pressure of 3 mmHg. FINDINGS  Left Ventricle: Left ventricular ejection fraction, by estimation, is >75%. The left ventricle has hyperdynamic function. The left ventricle has no regional wall motion abnormalities. Definity  contrast agent was given IV to delineate the left ventricular endocardial borders. The left ventricular internal cavity size was normal in size. There is moderate left ventricular hypertrophy. Left ventricular diastolic parameters are consistent with Grade I diastolic dysfunction (impaired relaxation). Right Ventricle: The right ventricular size is normal. Right ventricular systolic function is normal. Left Atrium: Left atrial size was normal in size.  Right Atrium: Right atrial size was normal in size. Pericardium: Trivial pericardial effusion is present. Mitral Valve: The mitral valve is normal in structure. No evidence of mitral valve regurgitation. No evidence of mitral valve stenosis. Tricuspid Valve: The tricuspid valve is normal in structure. Tricuspid valve regurgitation is trivial. No evidence of tricuspid stenosis. Aortic Valve: The aortic valve is tricuspid. Aortic valve regurgitation is not visualized. No aortic stenosis is present. Aortic valve peak gradient measures 6.4 mmHg. Pulmonic Valve: The pulmonic valve was not well visualized. Pulmonic valve  regurgitation is not visualized. No evidence of pulmonic stenosis. Aorta: The aortic root is normal in size and structure. Venous: The inferior vena cava is normal in size with greater than 50% respiratory variability, suggesting right atrial pressure of 3 mmHg. IAS/Shunts: The interatrial septum was not well visualized. Additional Comments: Technically difficult; saline microcavitation study not interpretable.  LEFT VENTRICLE PLAX 2D LVIDd:         3.50 cm   Diastology LVIDs:         2.30 cm   LV e' medial:    6.42 cm/s LV PW:         1.00 cm   LV E/e' medial:  8.6 LV IVS:        1.20 cm   LV e' lateral:   6.96 cm/s LVOT diam:     2.00 cm   LV E/e' lateral: 7.9 LV SV:         47 LV SV Index:   27 LVOT Area:     3.14 cm  RIGHT VENTRICLE             IVC RV S prime:     22.40 cm/s  IVC diam: 1.50 cm TAPSE (M-mode): 1.3 cm LEFT ATRIUM             Index        RIGHT ATRIUM           Index LA diam:        3.00 cm 1.73 cm/m   RA Area:     13.00 cm LA Vol (A2C):   36.9 ml 21.31 ml/m  RA Volume:   32.10 ml  18.53 ml/m LA Vol (A4C):   31.5 ml 18.19 ml/m LA Biplane Vol: 35.0 ml 20.21 ml/m  AORTIC VALVE AV Area (Vmax): 2.44 cm AV Vmax:        126.00 cm/s AV Peak Grad:   6.4 mmHg LVOT Vmax:      97.70 cm/s LVOT Vmean:     63.200 cm/s LVOT VTI:       0.150 m  AORTA Ao Root diam: 2.70 cm Ao Asc diam:  3.00 cm MITRAL VALVE               TRICUSPID VALVE MV Area (PHT): 6.32 cm    TR Peak grad:   6.0 mmHg MV Decel Time: 120 msec    TR Vmax:        122.00 cm/s MV E velocity: 55.30 cm/s MV A velocity: 89.70 cm/s  SHUNTS MV E/A ratio:  0.62        Systemic VTI:  0.15 m                            Systemic Diam: 2.00 cm Redell Shallow MD Electronically signed by Redell Shallow MD Signature Date/Time: 05/01/2024/10:04:54 AM    Final    MR BRAIN WO CONTRAST Result Date: 04/30/2024  EXAM: MRI BRAIN WITHOUT CONTRAST 04/30/2024 07:01:38 PM TECHNIQUE: Multiplanar multisequence MRI of the head/brain was performed without the  administration of intravenous contrast. COMPARISON: CT head 04/29/2024 and MRI head 03/01/2024. CLINICAL HISTORY: Mental status change, unknown cause. FINDINGS: BRAIN AND VENTRICLES: No acute infarct. No intracranial hemorrhage. No mass. No midline shift. No hydrocephalus. Scattered T2 and FLAIR hyperintensity in the periventricular and subcortical white matter compatible with mild chronic microvascular ischemic changes. Similar appearance of chronic encephalomalacia in the ventral medulla. Bilateral cerebellar remote infarcts. Similar small remote infarct along the right paramedian dorsal pons. Similar loss of the flow voids within the V4 segments of the vertebral arteries, right greater than left, as well as within the proximal basilar artery, similar to prior. Preserved flow void within the distal basilar artery, similar to prior. The sella is unremarkable. ORBITS: No acute abnormality. SINUSES AND MASTOIDS: Mucosal thickening in the ethmoid sinuses. Trace fluid in the left mastoid air cells. BONES AND SOFT TISSUES: Normal marrow signal. No acute soft tissue abnormality. IMPRESSION: 1. No acute intracranial abnormality. 2. Similar loss of flow voids within the V4 segments of the vertebral arteries, right greater than left, and within the proximal basilar artery. 3. 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 Electronically signed by: Donnice Mania MD 04/30/2024 08:28 PM EST RP Workstation: [id number]     LOS: 1 day   Donalda Applebaum, MD  [facility]    To contact the attending provider between 7A-7P or the covering provider during after hours 7P-7A, please log into the web site [email or web address] and access using universal [place] password for that web site. If you do not have the password, please call the hospital operator.  05/02/2024, 10:29 AM    "

## 2024-05-02 NOTE — Plan of Care (Signed)
" ° °  RN reported that patient has SVT per telemetry heart rate went up to over 150 which remains nonsustained and improved to 90s.  Hemodynamically stable.  Obtaining stat EKG.  "

## 2024-05-03 ENCOUNTER — Inpatient Hospital Stay (HOSPITAL_COMMUNITY)

## 2024-05-03 DIAGNOSIS — I7090 Unspecified atherosclerosis: Secondary | ICD-10-CM | POA: Diagnosis not present

## 2024-05-03 DIAGNOSIS — G9341 Metabolic encephalopathy: Secondary | ICD-10-CM | POA: Diagnosis not present

## 2024-05-03 DIAGNOSIS — E1169 Type 2 diabetes mellitus with other specified complication: Secondary | ICD-10-CM | POA: Diagnosis not present

## 2024-05-03 DIAGNOSIS — C8300 Small cell B-cell lymphoma, unspecified site: Secondary | ICD-10-CM | POA: Diagnosis not present

## 2024-05-03 DIAGNOSIS — N3000 Acute cystitis without hematuria: Secondary | ICD-10-CM | POA: Diagnosis not present

## 2024-05-03 LAB — GLUCOSE, CAPILLARY
Glucose-Capillary: 168 mg/dL — ABNORMAL HIGH (ref 70–99)
Glucose-Capillary: 173 mg/dL — ABNORMAL HIGH (ref 70–99)
Glucose-Capillary: 143 mg/dL — ABNORMAL HIGH (ref 70–99)

## 2024-05-03 MED ORDER — CARVEDILOL 12.5 MG PO TABS: 12.5000 mg | ORAL_TABLET | Freq: Two times a day (BID) | ORAL | Status: AC

## 2024-05-03 MED ORDER — AMOXICILLIN 500 MG PO CAPS: 500.0000 mg | ORAL_CAPSULE | Freq: Three times a day (TID) | ORAL | Status: AC

## 2024-05-03 MED ORDER — OXYCODONE HCL 5 MG PO TABS: 5.0000 mg | ORAL_TABLET | Freq: Four times a day (QID) | ORAL | 0 refills | Status: DC | PRN

## 2024-05-03 MED ORDER — GABAPENTIN 100 MG PO CAPS
200.0000 mg | ORAL_CAPSULE | Freq: Three times a day (TID) | ORAL | Status: DC
  Administered 2024-05-03: 200 mg via ORAL
  Filled 2024-05-03: qty 2

## 2024-05-03 MED ORDER — GABAPENTIN 100 MG PO CAPS: 200.0000 mg | ORAL_CAPSULE | Freq: Three times a day (TID) | ORAL | Status: AC

## 2024-05-03 MED ORDER — SODIUM CHLORIDE 0.9 % IV SOLN
1.0000 g | Freq: Once | INTRAVENOUS | Status: AC
  Administered 2024-05-03: 1 g via INTRAVENOUS
  Filled 2024-05-03: qty 10

## 2024-05-03 NOTE — TOC Transition Note (Signed)
 Transition of Care [facility]) - Discharge Note   Patient Details  Name: [name] MRN: [id number] Date of Birth: Feb 28, 1961  [facility]) CM/SW Contact:  Inocente GORMAN Kindle, LCSW Phone Number: 05/03/2024, 12:43 PM   Clinical Narrative:    Patient will DC to: [facility] Anticipated DC date: 05/04/23 Family notified: Sister, [name] Transport by: [place]   Per MD patient ready for DC to [facility]. RN to call report prior to discharge [phone] room 300-[employer]). RN, patient, patient's family, and facility notified of DC. Discharge Summary and FL2 sent to facility. DC packet on chart including signed script. Ambulance transport requested for patient.   CSW will sign off for now as social work intervention is no longer needed. Please consult us  again if new needs arise.     Final next level of care: Skilled Nursing Facility Barriers to Discharge: Barriers Resolved   Patient Goals and CMS Choice Patient states their goals for this hospitalization and ongoing recovery are:: Return to snf          Discharge Placement   Existing PASRR number confirmed : 05/03/24          Patient chooses bed at: [facility] Patient to be transferred to facility by: [facility] Name of family member notified: Sister Patient and family notified of of transfer: 05/03/24  Discharge Plan and Services Additional resources added to the After Visit Summary for   In-house Referral: Clinical Social Work   Post Acute Care Choice: Nursing Home                               Social Drivers of Health (SDOH) Interventions SDOH Screenings   Food Insecurity: No Food Insecurity (05/01/2024)  Housing: Low Risk (05/01/2024)  Transportation Needs: No Transportation Needs (05/01/2024)  Utilities: Not At Risk (05/01/2024)  Depression (PHQ2-9): Low Risk (03/16/2022)  Tobacco Use: Low Risk (05/01/2024)     Readmission Risk Interventions     No data to display

## 2024-05-03 NOTE — Progress Notes (Signed)
 ABI has been completed.   Results can be found under chart review under CV PROC. 05/03/2024 9:22 AM Javanni Maring RVT, RDMS

## 2024-05-03 NOTE — TOC Progression Note (Signed)
 Transition of Care [facility]) - Progression Note    Patient Details  Name: [name] MRN: [id number] Date of Birth: [birth year]  Transition of Care [facility]) CM/SW Contact  Inocente GORMAN Kindle, LCSW Phone Number: 05/03/2024, 10:12 AM  Clinical Narrative:    [name] ready to receive patient. CSW updated patient's sister, [name]. Patient will require [facility] for transport.    Expected Discharge Plan: Skilled Nursing Facility Barriers to Discharge: Barriers Resolved               Expected Discharge Plan and Services In-house Referral: Clinical Social Work   Post Acute Care Choice: Nursing Home Living arrangements for the past 2 months: Skilled Nursing Facility Expected Discharge Date: 05/03/24                                     Social Drivers of Health (SDOH) Interventions SDOH Screenings   Food Insecurity: No Food Insecurity (05/01/2024)  Housing: Low Risk (05/01/2024)  Transportation Needs: No Transportation Needs (05/01/2024)  Utilities: Not At Risk (05/01/2024)  Depression (PHQ2-9): Low Risk (03/16/2022)  Tobacco Use: Low Risk (05/01/2024)    Readmission Risk Interventions     No data to display

## 2024-05-03 NOTE — NC FL2 (Signed)
 " [place]  MEDICAID FL2 LEVEL OF CARE FORM     IDENTIFICATION  Patient Name: [name] Birthdate: 21-Jan-1961 Sex: female Admission Date (Current Location): 04/29/2024  [facility] and [place] Number:  [employer]film[employer]video and Address:  [facility] [place], [address], [place], [place] [address]      Provider Number: [id number]  Attending Physician Name and Address:  Raenelle Donalda HERO, MD  Relative Name and Phone Number:       Current Level of Care: Hospital Recommended Level of Care: Skilled Nursing Facility Prior Approval Number:    Date Approved/Denied:   PASRR Number: [id number]  Discharge Plan: SNF    Current Diagnoses: Patient Active Problem List   Diagnosis Date Noted   Acute metabolic encephalopathy 04/30/2024   Disorientation 03/01/2024   Protein-calorie malnutrition, severe 10/08/2022   Severe sepsis (HCC) 10/05/2022   Dehydration 10/05/2022   Acute prerenal azotemia 10/05/2022   Proctitis 10/05/2022   Hypercalcemia 10/05/2022   Transaminitis 10/05/2022   Pyuria 10/05/2022   Depression 10/05/2022   CAP (community acquired pneumonia) 10/04/2022   Malnutrition of moderate degree 09/26/2022   SOB (shortness of breath) 09/25/2022   Nausea vomiting and diarrhea 09/24/2022   Shortness of breath 09/24/2022   Acute respiratory failure with hypoxia (HCC) 09/24/2022   LFT elevation 09/24/2022   Diabetes mellitus (HCC) 09/06/2022   Rectal ulcer 07/09/2022   Colon ulcer 07/08/2022   Rectal bleeding 07/07/2022   Anemia 07/07/2022   GI bleed 07/06/2022   Dysarthria 06/12/2022   Aspiration pneumonia (HCC) 06/12/2022   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   Anxiety disorder due to medical condition 06/10/2022   AKI (acute kidney injury) 06/01/2022   Dizziness 05/29/2022   Personal history of CLL (chronic lymphocytic leukemia) 05/29/2022   Rectal pain 05/29/2022   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 disease, with  long-term current use of insulin  (HCC) 04/14/2022   Type 2 diabetes mellitus with diabetic polyneuropathy, with long-term current use of insulin  (HCC) 04/14/2022   Antiplatelet or antithrombotic long-term use 03/16/2022   HLD (hyperlipidemia) 10/01/2021   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HCC) 09/17/2021   Stage 3a chronic kidney disease (CKD) (HCC) 09/16/2021   Thyroid  nodule 09/16/2021   Cervical lymphadenopathy 09/16/2021   Acute CVA (cerebrovascular accident) (HCC) 09/15/2021   GERD (gastroesophageal reflux disease) 04/30/2021   Peripheral arterial disease 04/13/2021   Atherosclerosis of aorta 02/14/2021   Cerebral thrombosis with cerebral infarction 02/08/2021   Abnormal MRI of head 02/07/2021   Vertigo 02/07/2021   Class 1 obesity due to excess calories with body mass index (BMI) of 31.0 to 31.9 in adult 02/07/2021   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   Trigger finger, left ring finger 10/04/2018   Alternating constipation and diarrhea 04/20/2018   Diabetic peripheral neuropathy associated with type 2 diabetes mellitus (HCC) 02/26/2018   DM2 (diabetes mellitus, type 2) (HCC) 07/29/2015   ABNORMALITY OF GAIT 05/10/2010   ECZEMA, ATOPIC 04/03/2009   Hyperlipidemia associated with type 2 diabetes mellitus (HCC) 03/06/2009   BACK PAIN 11/14/2008   Hemiparesis affecting left side as late effect of stroke (HCC) 08/05/2008   DIPLOPIA 07/15/2008   Essential hypertension 07/15/2008    Orientation RESPIRATION BLADDER Height & Weight     Self, Place, Time  Normal Incontinent, External catheter Weight: 152 lb 8.9 oz (69.2 kg) Height:  5' 4 (162.6 cm)  BEHAVIORAL SYMPTOMS/MOOD NEUROLOGICAL BOWEL NUTRITION STATUS      Continent Diet (see dc summary)  AMBULATORY STATUS COMMUNICATION OF NEEDS Skin   Extensive Assist Verbally Normal                       Personal Care Assistance Level of Assistance  Bathing, Feeding, Dressing Bathing Assistance: Maximum  assistance Feeding assistance: Limited assistance Dressing Assistance: Maximum assistance     Functional Limitations Info  Sight, Hearing Sight Info: Impaired Hearing Info: Impaired      SPECIAL CARE FACTORS FREQUENCY                       Contractures Contractures Info: Not present    Additional Factors Info  Code Status, Allergies, Psychotropic, Insulin  Sliding Scale Code Status Info: Full Allergies Info: NKA Psychotropic Info: See dc summary Insulin  Sliding Scale Info: see dc summary       Current Medications (05/03/2024):  This is the current hospital active medication list Current Facility-Administered Medications  Medication Dose Route Frequency Provider Last Rate Last Admin   acetaminophen  (TYLENOL ) tablet 650 mg  650 mg Oral Q6H PRN Howerter, Justin B, DO   650 mg at 05/02/24 1725   Or   acetaminophen  (TYLENOL ) suppository 650 mg  650 mg Rectal Q6H PRN Howerter, Justin B, DO       amLODipine  (NORVASC ) tablet 10 mg  10 mg Oral Daily Tobie Mario GAILS, MD   10 mg at 05/03/24 0959   aspirin  EC tablet 81 mg  81 mg Oral Daily Patel, Ekta V, MD   81 mg at 05/03/24 0959   atorvastatin  (LIPITOR ) tablet 80 mg  80 mg Oral Daily Patel, Ekta V, MD   80 mg at 05/03/24 0959   busPIRone  (BUSPAR ) tablet 10 mg  10 mg Oral BID Patel, Ekta V, MD   10 mg at 05/03/24 0959   carvedilol (COREG) tablet 12.5 mg  12.5 mg Oral BID WC Ghimire, Shanker M, MD   12.5 mg at 05/03/24 9040   cefTRIAXone  (ROCEPHIN ) 1 g in sodium chloride  0.9 % 100 mL IVPB  1 g Intravenous Once Ghimire, Shanker M, MD       NOREEN ON 05/04/2024] cloNIDine (CATAPRES - Dosed in mg/24 hr) patch 0.1 mg  0.1 mg Transdermal Q Sat Patel, Mario V, MD       ezetimibe  (ZETIA ) tablet 10 mg  10 mg Oral Daily Patel, Ekta V, MD   10 mg at 05/03/24 9041   FLUoxetine  (PROZAC ) capsule 40 mg  40 mg Oral Daily Patel, Ekta V, MD   40 mg at 05/03/24 9040   gabapentin  (NEURONTIN ) capsule 200 mg  200 mg Oral TID Ghimire, Shanker M, MD   200 mg  at 05/03/24 0959   heparin  injection 5,000 Units  5,000 Units Subcutaneous Q8H Tobie Mario GAILS, MD   5,000 Units at 05/03/24 0505   hydrochlorothiazide  (HYDRODIURIL ) tablet 12.5 mg  12.5 mg Oral Daily Patel, Ekta V, MD   12.5 mg at 05/03/24 9040   insulin  aspart (novoLOG ) injection 0-9 Units  0-9 Units Subcutaneous TID WC Howerter, Justin B, DO   3 Units at 05/02/24 1709   labetalol  (NORMODYNE ) injection 10 mg  10 mg Intravenous Q4H PRN Raenelle Donalda HERO, MD   10 mg at 05/01/24 2146   melatonin tablet 3 mg  3 mg Oral QHS PRN Howerter, Justin B, DO   3 mg at 05/02/24 2142   methocarbamol  (ROBAXIN ) tablet 500 mg  500 mg Oral Q8H PRN Raenelle Donalda HERO, MD   500  mg at 05/02/24 2142   ondansetron  (ZOFRAN ) injection 4 mg  4 mg Intravenous Q6H PRN Howerter, Justin B, DO       oxyCODONE  (Oxy IR/ROXICODONE ) immediate release tablet 5 mg  5 mg Oral Q6H PRN Raenelle Donalda HERO, MD   5 mg at 05/02/24 1443   pantoprazole  (PROTONIX ) injection 40 mg  40 mg Intravenous Q12H Patel, Ekta V, MD   40 mg at 05/02/24 2141   venetoclax  (VENCLEXTA ) tablet 200 mg  200 mg Oral Daily Reome, Earle J, RPH   200 mg at 05/02/24 1134     Discharge Medications: Please see discharge summary for a list of discharge medications.  Relevant Imaging Results:  Relevant Lab Results:   Additional Information [id number]  Inocente GORMAN Kindle, LCSW     "

## 2024-05-03 NOTE — Discharge Summary (Signed)
 "  PATIENT DETAILS Name: [name] Age: 64 y.o. Sex: female Date of Birth: Jan 15, 1961 MRN: [id number]. Admitting Physician: Eva KATHEE Pore, DO ERE:[email or web address], [place], DO  Admit Date: 04/29/2024 Discharge date: 05/03/2024  Recommendations for Outpatient Follow-up:  Follow up with PCP in 1-2 weeks Please obtain CMP/CBC in one week Has heterogeneously enlarged right thyroid  Has heterogeneous enlarged right thyroid  gland- Defer further imaging studies/workup to outpatient setting.  Admitted From:  SNF  Disposition: Skilled nursing facility   Discharge Condition: fair  CODE STATUS:   Code Status: Full Code   Diet recommendation:  Diet Order             Diet Carb Modified           Diet - low sodium heart healthy           Diet heart healthy/carb modified Room service appropriate? Yes; Fluid consistency: Thin  Diet effective now                    Brief Summary: Patient is a 64 y.o.  female with history of CVA-left-sided residual hemiparesis, HTN, HLD, DM-2, non-Hodgkin's lymphoma-who was brought to the ED from SNF for altered mental status/B/L lower extremity pain.   Significant events: 12/30>> admit to [facility]   Significant studies: 12/29>> CT abdomen/pelvis: No acute abnormalities. 12/29>> CT head: No acute abnormality 12/30>> right femur x-ray: No acute findings 12/30>> left femur x-ray: No acute findings 12/30>> chest x-ray: No PNA 12/30>> CTA chest: No PE 12/30>> MRI brain: No acute intracranial abnormality 12/31>> echo: EF> 75%, grade 1 diastolic dysfunction 12/31>> B/L lower extremity Doppler: No DVT. 1/2>> ABI: Right-normal range, left moderate PAD (0.75)   Significant microbiology data: 12/30>> urine culture: E. coli   Procedures: None   Consults: Oncology  Brief Hospital Course: Acute metabolic encephalopathy Thought to be secondary to complicated UTI Overall improved-treatment of underlying UTI suspect back to close line    Complicated UTI-E. coli Managed with Rocephin -Will transition to amoxicillin  for 1 additional day on discharge to complete a 5-day course. Note-patient immunocompromised-on chemotherapy for small lymphocytic lymphoma   B/L lower extremity pain Do not see any wounds Discussed with sister over the phone on 1/1-patient has been having bilateral lower extremity pain for almost a year since her CVA This could be related to neuropathy-probably from diabetes Since mentation has improved-has been started on Neurontin -dosage being gradually increased.   Note-Dopplers negative for DVT-has known mild PAD on the left leg-these are chronic issues. Gradually optimize Neurontin  dosage at SNF.   Hypertensive urgency BP better at this point Continue Coreg-continue HCTZ/amlodipine /transdermal clonidine on discharge.   Brief run of SVT on 1/1 Already on beta-blocker   History of CVA with left hemiparesis At baseline MRI brain negative for acute CVA Continue aspirin /statin/Zetia    DM-2 (A1c 7.6 on November 2025) CBG stable Continue SSI Resume oral hypoglycemic agents on discharge.  History of hypothyroidism TSH/free T4 stable-no longer on methimazole  (per pharmacy-methimazole  last filled October/November)    GERD PPI   Mood disorder Prozac /BuSpar    Small lymphocytic lymphoma Follows with oncology-Dr Onesimo Resume venetoclax     Heterogeneous enlarged right thyroid  gland Incidental finding on CT chest-stable for outpatient follow-up with PCP for nonemergent thyroid  ultrasound.   Chronic debility/deconditioning Unclear functional status-poor historian-appears to have significant left-sided hemiplegia from prior CVA. SNF resident PT/OT eval-SNF recommended.  Discharge Diagnoses:  Principal Problem:   Acute metabolic encephalopathy Active Problems:   Small lymphocytic lymphoma (HCC)   Discharge Instructions:  Activity:  As tolerated with Full fall precautions use walker/cane &  assistance as needed  Discharge Instructions     Call MD for:  severe uncontrolled pain   Complete by: As directed    Diet - low sodium heart healthy   Complete by: As directed    Diet Carb Modified   Complete by: As directed    Discharge instructions   Complete by: As directed    Follow with Primary MD  [name], DO in 1-2 weeks  Please get a complete blood count and chemistry panel checked by your Primary MD at your next visit, and again as instructed by your Primary MD.  Get Medicines reviewed and adjusted: Please take all your medications with you for your next visit with your Primary MD  Laboratory/radiological data: Please request your Primary MD to go over all hospital tests and procedure/radiological results at the follow up, please ask your Primary MD to get all Hospital records sent to his/her office.  In some cases, they will be blood work, cultures and biopsy results pending at the time of your discharge. Please request that your primary care M.D. follows up on these results.  Also Note the following: If you experience worsening of your admission symptoms, develop shortness of breath, life threatening emergency, suicidal or homicidal thoughts you must seek medical attention immediately by calling 911 or calling your MD immediately  if symptoms less severe.  You must read complete instructions/literature along with all the possible adverse reactions/side effects for all the Medicines you take and that have been prescribed to you. Take any new Medicines after you have completely understood and accpet all the possible adverse reactions/side effects.   Do not drive when taking Pain medications or sleeping medications (Benzodaizepines)  Do not take more than prescribed Pain, Sleep and Anxiety Medications. It is not advisable to combine anxiety,sleep and pain medications without talking with your primary care practitioner  Special Instructions: If you have smoked or  chewed Tobacco  in the last 2 yrs please stop smoking, stop any regular Alcohol  and or any Recreational drug use.  Wear Seat belts while driving.  Please note: You were cared for by a hospitalist during your hospital stay. Once you are discharged, your primary care physician will handle any further medical issues. Please note that NO REFILLS for any discharge medications will be authorized once you are discharged, as it is imperative that you return to your primary care physician (or establish a relationship with a primary care physician if you do not have one) for your post hospital discharge needs so that they can reassess your need for medications and monitor your lab values.   Check CBGs before meals and at bedtime   Increase activity slowly   Complete by: As directed       Allergies as of 05/03/2024   No Known Allergies      Medication List     STOP taking these medications    insulin  glargine 100 UNIT/ML injection Commonly known as: LANTUS    traMADol  HCl 25 MG Tabs       TAKE these medications    acetaminophen  325 MG tablet Commonly known as: TYLENOL  Take 650 mg by mouth every 4 (four) hours as needed for mild pain (pain score 1-3) or fever. Do not exceed 3000mg  in 24 hours   amLODipine  10 MG tablet Commonly known as: NORVASC  Take 1 tablet by mouth once daily   ammonium lactate 12 % lotion Commonly known  as: LAC-HYDRIN Apply 1 Application topically daily. Apply to bilateral feet and lower legs topically every day shift for dry skin   amoxicillin  500 MG capsule Commonly known as: AMOXIL  Take 1 capsule (500 mg total) by mouth 3 (three) times daily for 1 day. Start taking on: May 04, 2024   Aspercreme Lidocaine  4 % Crea Generic drug: Lidocaine  HCl Apply 1 Application topically daily as needed (bilateral feet pain).   aspirin  EC 81 MG tablet Take 1 tablet (81 mg total) by mouth daily. Swallow whole.   atorvastatin  80 MG tablet Commonly known as:  Lipitor  Take 1 tablet (80 mg total) by mouth daily.   Baclofen  5 MG Tabs Take 4 tablets (20 mg total) by mouth 3 (three) times daily as needed for muscle spasms. What changed: when to take this   busPIRone  10 MG tablet Commonly known as: BUSPAR  Take 10 mg by mouth 2 (two) times daily.   carvedilol 12.5 MG tablet Commonly known as: COREG Take 1 tablet (12.5 mg total) by mouth 2 (two) times daily with a meal.   cloNIDine 0.1 mg/24hr patch Commonly known as: CATAPRES - Dosed in mg/24 hr Place 0.1 mg onto the skin every Saturday.   ezetimibe  10 MG tablet Commonly known as: ZETIA  Take 1 tablet (10 mg total) by mouth daily.   ferrous sulfate 325 (65 FE) MG tablet Take 325 mg by mouth daily at 12 noon.   FLUoxetine  40 MG capsule Commonly known as: PROZAC  Take 40 mg by mouth daily.   gabapentin  100 MG capsule Commonly known as: NEURONTIN  Take 2 capsules (200 mg total) by mouth 3 (three) times daily.   guaiFENesin  100 MG/5ML liquid Commonly known as: ROBITUSSIN Take 10 mLs by mouth every 4 (four) hours as needed for cough or to loosen phlegm.   hydrochlorothiazide  12.5 MG tablet Commonly known as: HYDRODIURIL  Take 1 tablet (12.5 mg total) by mouth daily.   insulin  aspart 100 UNIT/ML injection Commonly known as: novoLOG  Inject 0-10 Units into the skin 3 (three) times daily before meals. Before each meal 3 times a day, 140-199 - 4 units, 200-250 - 6 units, 251-299 - 8 units,  300-349 - 10 units,  350 or above 12 units. What changed: how much to take   melatonin 5 MG Tabs Take 5 mg by mouth at bedtime as needed (insomnia).   metFORMIN  500 MG tablet Commonly known as: GLUCOPHAGE  Take 500 mg by mouth 2 (two) times daily with a meal.   Orajel 2X Toothache & Gum 20-0.26 % Gel Generic drug: Benzocaine-Menthol  Use as directed 1 Application in the mouth or throat every 4 (four) hours as needed.   oxyCODONE  5 MG immediate release tablet Commonly known as: Oxy  IR/ROXICODONE  Take 1 tablet (5 mg total) by mouth every 6 (six) hours as needed for moderate pain (pain score 4-6).   pantoprazole  40 MG tablet Commonly known as: PROTONIX  Take 1 tablet (40 mg total) by mouth daily.   senna-docusate 8.6-50 MG tablet Commonly known as: Senokot-S Take 1 tablet by mouth daily as needed for mild constipation.   SM Cranberry 300 MG tablet Generic drug: Cranberry Take 300 mg by mouth 2 (two) times daily.   Venclexta  100 MG tablet Generic drug: venetoclax  Take 2 tablets (200 mg total) by mouth daily. Tablets should be swallowed whole with a meal and a full glass of water.        Follow-up Information     [name], DO. Schedule an appointment as soon  as possible for a visit in 1 week(s).   Contact information: [address] [place] [address] [phone]                Allergies[1]   Other Procedures/Studies: VAS US  ABI WITH/WO TBI Result Date: 05/03/2024  LOWER EXTREMITY DOPPLER STUDY Patient Name:  Dontasia Miranda  Date of Exam:   05/01/2024 Medical Rec #: [id number]        Accession #:    [id number] Date of Birth: [birth year]        Patient Gender: F Patient Age:   30 years Exam Location:  [facility] Procedure:      VAS US  ABI WITH/WO TBI Referring Phys: DONALDA APPLEBAUM --------------------------------------------------------------------------------  Indications: Peripheral artery disease. High Risk Factors: Hypertension, hyperlipidemia, Diabetes, no history of                    smoking, prior CVA. Other Factors: CKD.  Comparison Study: Previous exam was on 04/08/2022 Performing Technologist: Ezzie Potters RVT, RDMS  Examination Guidelines: A complete evaluation includes at minimum, Doppler waveform signals and systolic blood pressure reading at the level of bilateral brachial, anterior tibial, and posterior tibial arteries, when vessel segments are accessible. Bilateral testing is considered an integral part of a complete  examination. Photoelectric Plethysmograph (PPG) waveforms and toe systolic pressure readings are included as required and additional duplex testing as needed. Limited examinations for reoccurring indications may be performed as noted.  ABI Findings: +---------+------------------+-----+---------+--------+ Right    Rt Pressure (mmHg)IndexWaveform Comment  +---------+------------------+-----+---------+--------+ Brachial 167                    triphasic         +---------+------------------+-----+---------+--------+ PTA      162               0.97 biphasic          +---------+------------------+-----+---------+--------+ DP       149               0.89 biphasic          +---------+------------------+-----+---------+--------+ Great Toe136               0.81 Normal            +---------+------------------+-----+---------+--------+ +---------+------------------+-----+----------+----------------------+ Left     Lt Pressure (mmHg)IndexWaveform  Comment                +---------+------------------+-----+----------+----------------------+ Brachial                                  contracted (prior CVA) +---------+------------------+-----+----------+----------------------+ PTA      126               0.75 monophasic                       +---------+------------------+-----+----------+----------------------+ DP       120               0.72 monophasic                       +---------+------------------+-----+----------+----------------------+ Great Toe118               0.71 Abnormal                         +---------+------------------+-----+----------+----------------------+ +-------+-----------+-----------+------------+------------+ ABI/TBIToday's ABIToday's TBIPrevious ABIPrevious TBI +-------+-----------+-----------+------------+------------+  Right  0.97       0.80       0.94        0.80         +-------+-----------+-----------+------------+------------+  Left   0.75       0.71       0.64        0.61         +-------+-----------+-----------+------------+------------+   Summary: Right: Resting right ankle-brachial index is within normal range. The right toe-brachial index is normal.  Left: Resting left ankle-brachial index indicates moderate left lower extremity arterial disease. The left toe-brachial index is normal, however waveform is dampened.  *See table(s) above for measurements and observations.     Preliminary    VAS US  LOWER EXTREMITY VENOUS (DVT) Result Date: 05/01/2024  Lower Venous DVT Study Patient Name:  NATTALY YEBRA  Date of Exam:   05/01/2024 Medical Rec #: [id number]        Accession #:    [id number] Date of Birth: [birth year]        Patient Gender: F Patient Age:   17 years Exam Location:  [facility] Procedure:      VAS US  LOWER EXTREMITY VENOUS (DVT) Referring Phys: EKTA PATEL --------------------------------------------------------------------------------  Indications: History of CVA with residual left sided weakness/hemiparesis and dysarthria, Swelling, and Pain.  Risk Factors: DM, HTN, HLD. Comparison Study: No priors. Performing Technologist: Ricka Sturdivant-Jones RDMS, RVT  Examination Guidelines: A complete evaluation includes B-mode imaging, spectral Doppler, color Doppler, and power Doppler as needed of all accessible portions of each vessel. Bilateral testing is considered an integral part of a complete examination. Limited examinations for reoccurring indications may be performed as noted. The reflux portion of the exam is performed with the patient in reverse Trendelenburg.  +---------+---------------+---------+-----------+----------+-------------------+ RIGHT    CompressibilityPhasicitySpontaneityPropertiesThrombus Aging      +---------+---------------+---------+-----------+----------+-------------------+ CFV      Full           Yes      Yes                                       +---------+---------------+---------+-----------+----------+-------------------+ SFJ      Full                                                             +---------+---------------+---------+-----------+----------+-------------------+ FV Prox  Full                                                             +---------+---------------+---------+-----------+----------+-------------------+ FV Mid   Full           Yes      Yes                                      +---------+---------------+---------+-----------+----------+-------------------+ FV DistalFull                                                             +---------+---------------+---------+-----------+----------+-------------------+  PFV      Full                                                             +---------+---------------+---------+-----------+----------+-------------------+ POP      Full           Yes      Yes                                      +---------+---------------+---------+-----------+----------+-------------------+ PTV      Full                                                             +---------+---------------+---------+-----------+----------+-------------------+ PERO                                                  not well visualized +---------+---------------+---------+-----------+----------+-------------------+   +---------+---------------+---------+-----------+----------+--------------+ LEFT     CompressibilityPhasicitySpontaneityPropertiesThrombus Aging +---------+---------------+---------+-----------+----------+--------------+ CFV      Full           Yes      Yes                                 +---------+---------------+---------+-----------+----------+--------------+ SFJ      Full                                                        +---------+---------------+---------+-----------+----------+--------------+ FV Prox  Full                                                         +---------+---------------+---------+-----------+----------+--------------+ FV Mid   Full           Yes      Yes                                 +---------+---------------+---------+-----------+----------+--------------+ FV DistalFull                                                        +---------+---------------+---------+-----------+----------+--------------+ PFV      Full                                                        +---------+---------------+---------+-----------+----------+--------------+  POP      Full           Yes      Yes                                 +---------+---------------+---------+-----------+----------+--------------+ PTV      Full                                                        +---------+---------------+---------+-----------+----------+--------------+ PERO     Full                                                        +---------+---------------+---------+-----------+----------+--------------+     Summary: RIGHT: - There is no evidence of deep vein thrombosis in the lower extremity. However, portions of this examination were limited- see technologist comments above.  - No cystic structure found in the popliteal fossa.  LEFT: - There is no evidence of deep vein thrombosis in the lower extremity.  - No cystic structure found in the popliteal fossa.  *See table(s) above for measurements and observations. Electronically signed by Penne Colorado MD on 05/01/2024 at 5:02:20 PM.    Final    ECHOCARDIOGRAM COMPLETE BUBBLE STUDY Result Date: 05/01/2024    ECHOCARDIOGRAM REPORT   Patient Name:   TOSHUA HONSINGER Date of Exam: 05/01/2024 Medical Rec #:  [id number]       Height:       64.0 in Accession #:    [id number]      Weight:       150.1 lb Date of Birth:  February 24, 1961       BSA:          1.732 m Patient Age:    63 years        BP:           142/92 mmHg Patient Gender: F               HR:           108 bpm.  Exam Location:  Inpatient Procedure: 2D Echo, Cardiac Doppler, Color Doppler, Intracardiac Opacification            Agent and Saline Contrast Bubble Study (Both Spectral and Color Flow            Doppler were utilized during procedure). Indications:    Stroke  History:        Patient has prior history of Echocardiogram examinations, most                 recent 05/31/2022. Risk Factors:Hypertension, Diabetes and                 Dyslipidemia.  Sonographer:    Sherlean Dubin Referring Phys: [id number] EKTA V PATEL  Sonographer Comments: Suboptimal apical window. Image acquisition challenging due to patient body habitus and Image acquisition challenging due to respiratory motion. IMPRESSIONS  1. Technically difficult; saline microcavitation study not interpretable.  2. Left ventricular ejection fraction, by estimation, is >75%. The left ventricle has hyperdynamic function. The left ventricle has no regional wall motion abnormalities.  There is moderate left ventricular hypertrophy. Left ventricular diastolic parameters are consistent with Grade I diastolic dysfunction (impaired relaxation).  3. Right ventricular systolic function is normal. The right ventricular size is normal.  4. The mitral valve is normal in structure. No evidence of mitral valve regurgitation. No evidence of mitral stenosis.  5. The aortic valve is tricuspid. Aortic valve regurgitation is not visualized. No aortic stenosis is present.  6. The inferior vena cava is normal in size with greater than 50% respiratory variability, suggesting right atrial pressure of 3 mmHg. FINDINGS  Left Ventricle: Left ventricular ejection fraction, by estimation, is >75%. The left ventricle has hyperdynamic function. The left ventricle has no regional wall motion abnormalities. Definity  contrast agent was given IV to delineate the left ventricular endocardial borders. The left ventricular internal cavity size was normal in size. There is moderate left ventricular  hypertrophy. Left ventricular diastolic parameters are consistent with Grade I diastolic dysfunction (impaired relaxation). Right Ventricle: The right ventricular size is normal. Right ventricular systolic function is normal. Left Atrium: Left atrial size was normal in size. Right Atrium: Right atrial size was normal in size. Pericardium: Trivial pericardial effusion is present. Mitral Valve: The mitral valve is normal in structure. No evidence of mitral valve regurgitation. No evidence of mitral valve stenosis. Tricuspid Valve: The tricuspid valve is normal in structure. Tricuspid valve regurgitation is trivial. No evidence of tricuspid stenosis. Aortic Valve: The aortic valve is tricuspid. Aortic valve regurgitation is not visualized. No aortic stenosis is present. Aortic valve peak gradient measures 6.4 mmHg. Pulmonic Valve: The pulmonic valve was not well visualized. Pulmonic valve regurgitation is not visualized. No evidence of pulmonic stenosis. Aorta: The aortic root is normal in size and structure. Venous: The inferior vena cava is normal in size with greater than 50% respiratory variability, suggesting right atrial pressure of 3 mmHg. IAS/Shunts: The interatrial septum was not well visualized. Additional Comments: Technically difficult; saline microcavitation study not interpretable.  LEFT VENTRICLE PLAX 2D LVIDd:         3.50 cm   Diastology LVIDs:         2.30 cm   LV e' medial:    6.42 cm/s LV PW:         1.00 cm   LV E/e' medial:  8.6 LV IVS:        1.20 cm   LV e' lateral:   6.96 cm/s LVOT diam:     2.00 cm   LV E/e' lateral: 7.9 LV SV:         47 LV SV Index:   27 LVOT Area:     3.14 cm  RIGHT VENTRICLE             IVC RV S prime:     22.40 cm/s  IVC diam: 1.50 cm TAPSE (M-mode): 1.3 cm LEFT ATRIUM             Index        RIGHT ATRIUM           Index LA diam:        3.00 cm 1.73 cm/m   RA Area:     13.00 cm LA Vol (A2C):   36.9 ml 21.31 ml/m  RA Volume:   32.10 ml  18.53 ml/m LA Vol (A4C):    31.5 ml 18.19 ml/m LA Biplane Vol: 35.0 ml 20.21 ml/m  AORTIC VALVE AV Area (Vmax): 2.44 cm AV Vmax:        126.00  cm/s AV Peak Grad:   6.4 mmHg LVOT Vmax:      97.70 cm/s LVOT Vmean:     63.200 cm/s LVOT VTI:       0.150 m  AORTA Ao Root diam: 2.70 cm Ao Asc diam:  3.00 cm MITRAL VALVE               TRICUSPID VALVE MV Area (PHT): 6.32 cm    TR Peak grad:   6.0 mmHg MV Decel Time: 120 msec    TR Vmax:        122.00 cm/s MV E velocity: 55.30 cm/s MV A velocity: 89.70 cm/s  SHUNTS MV E/A ratio:  0.62        Systemic VTI:  0.15 m                            Systemic Diam: 2.00 cm Redell Shallow MD Electronically signed by Redell Shallow MD Signature Date/Time: 05/01/2024/10:04:54 AM    Final    MR BRAIN WO CONTRAST Result Date: 04/30/2024 EXAM: MRI BRAIN WITHOUT CONTRAST 04/30/2024 07:01:38 PM TECHNIQUE: Multiplanar multisequence MRI of the head/brain was performed without the administration of intravenous contrast. COMPARISON: CT head 04/29/2024 and MRI head 03/01/2024. CLINICAL HISTORY: Mental status change, unknown cause. FINDINGS: BRAIN AND VENTRICLES: No acute infarct. No intracranial hemorrhage. No mass. No midline shift. No hydrocephalus. Scattered T2 and FLAIR hyperintensity in the periventricular and subcortical white matter compatible with mild chronic microvascular ischemic changes. Similar appearance of chronic encephalomalacia in the ventral medulla. Bilateral cerebellar remote infarcts. Similar small remote infarct along the right paramedian dorsal pons. Similar loss of the flow voids within the V4 segments of the vertebral arteries, right greater than left, as well as within the proximal basilar artery, similar to prior. Preserved flow void within the distal basilar artery, similar to prior. The sella is unremarkable. ORBITS: No acute abnormality. SINUSES AND MASTOIDS: Mucosal thickening in the ethmoid sinuses. Trace fluid in the left mastoid air cells. BONES AND SOFT TISSUES: Normal marrow  signal. No acute soft tissue abnormality. IMPRESSION: 1. No acute intracranial abnormality. 2. Similar loss of flow voids within the V4 segments of the vertebral arteries, right greater than left, and within the proximal basilar artery. 3. 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 Electronically signed by: Donnice Mania MD 04/30/2024 08:28 PM EST RP Workstation: [id number]   CT Angio Chest PE W and/or Wo Contrast Result Date: 04/30/2024 EXAM: CTA CHEST 04/30/2024 10:14:21 AM TECHNIQUE: CTA of the chest was performed without and with the administration of 75 mL of iohexol  (OMNIPAQUE ) 350 MG/ML injection. Multiplanar reformatted images are provided for review. MIP images are provided for review. Automated exposure control, iterative reconstruction, and/or weight based adjustment of the mA/kV was utilized to reduce the radiation dose to as low as reasonably achievable. COMPARISON: None available. CLINICAL HISTORY: Pulmonary embolism (PE) suspected, high prob. FINDINGS: PULMONARY ARTERIES: Pulmonary arteries are adequately opacified for evaluation. No acute pulmonary embolus. The main pulmonary artery measures at the upper limits of normal at 3.2 cm. The limited evaluation of the subsegmental level is due to motion artifact. MEDIASTINUM: Aberrant right subclavian arteryis noted. The thoracic aorta is normal in caliber. The heart and pericardium demonstrate no acute abnormality other than cardiomegaly. LYMPH NODES: No mediastinal, hilar or axillary lymphadenopathy. LUNGS AND PLEURA: Bibasilar linear atelectasis versus scarring. Expiratory phase of respiration. Mosaic attenuation of the lungs. No focal  consolidation or pulmonary edema. No evidence of pleural effusion or pneumothorax. UPPER ABDOMEN: Colonic diverticulosis. SOFT TISSUES AND BONES: Enlarged heterogeneous right thyroid  gland with underlying hyperdense and hypodense masses. No acute bone  abnormality. Heterogenous appearance of the spine suggestive of osteopenia. IMPRESSION: 1. No pulmonary embolism. Limited evaluation at the subsegmental level due to motion artifact and adjacent lung disease. 2. No acute findings in the setting of expiratory phase of respiration and linear scarring/atelectasis. 3. Prominent pulmonary artery size - correlate for pulmonary hypertension. 4. Cardiomegaly. 5. Enlarged heterogeneous right thyroid  gland with underlying masses; recommend non-emergent thyroid  ultrasound. Electronically signed by: Morgane Naveau MD 04/30/2024 11:58 AM EST RP Workstation: [id number]   DG Femur Portable Min 2 Views Left Result Date: 04/30/2024 EXAM: 2 VIEW(S) XRAY OF THE _LATERALITY_ FEMUR 04/30/2024 01:17:00 AM COMPARISON: None available. CLINICAL HISTORY: pain FINDINGS: BONES AND JOINTS: No acute fracture. No malalignment. SOFT TISSUES: Vascular calcifications. IMPRESSION: 1. No acute findings. Electronically signed by: Franky Stanford MD 04/30/2024 02:56 AM EST RP Workstation: [id number]   DG Chest Portable 1 View Result Date: 04/30/2024 EXAM: 1 VIEW(S) XRAY OF THE CHEST 04/30/2024 01:18:00 AM COMPARISON: 03/01/2024 CLINICAL HISTORY: pain FINDINGS: LUNGS AND PLEURA: Low lung volumes. Stable linear atelectasis or scarring in left mid lung. No pleural effusion. No pneumothorax. HEART AND MEDIASTINUM: Aortic atherosclerosis. No acute abnormality of the cardiac silhouette. BONES AND SOFT TISSUES: No acute osseous abnormality. IMPRESSION: 1. Stable linear atelectasis or scarring in left mid lung. 2. Low lung volumes. Electronically signed by: Franky Stanford MD 04/30/2024 02:56 AM EST RP Workstation: [id number]   DG Femur Portable Min 2 Views Right Result Date: 04/30/2024 EXAM: 2 VIEW(S) XRAY OF THE FEMUR 04/30/2024 01:14:00 AM COMPARISON: None available. CLINICAL HISTORY: pain FINDINGS: BONES AND JOINTS: No acute fracture. No malalignment. SOFT TISSUES: Scattered femoral arterial  calcifications. IMPRESSION: 1. No acute findings. Electronically signed by: Franky Stanford MD 04/30/2024 02:55 AM EST RP Workstation: [id number]   CT ABDOMEN PELVIS WO CONTRAST Result Date: 04/29/2024 EXAM: CT ABDOMEN AND PELVIS WITHOUT CONTRAST 04/29/2024 07:26:02 PM TECHNIQUE: CT of the abdomen and pelvis was performed without the administration of intravenous contrast. Multiplanar reformatted images are provided for review. Automated exposure control, iterative reconstruction, and/or weight-based adjustment of the mA/kV was utilized to reduce the radiation dose to as low as reasonably achievable. COMPARISON: Findings are compared to 10/04/2022. CLINICAL HISTORY: Abdominal pain, acute, nonlocalized. Chronic lymphocytic leukemia. Tracking code: Bo. FINDINGS: LOWER CHEST: No acute abnormality. LIVER: The liver is unremarkable. GALLBLADDER AND BILE DUCTS: Gallbladder is unremarkable. No biliary ductal dilatation. SPLEEN: No acute abnormality. PANCREAS: No acute abnormality. ADRENAL GLANDS: No acute abnormality. KIDNEYS, URETERS AND BLADDER: Right kidney: Simple cortical cyst noted within the lower pole of the right kidney for which no follow up imaging is recommended. Left kidney: Left lower pole exophytic low attenuation lesion was previously characterized as a simple cyst and demonstrates interval decrease in size in keeping with an involuting cyst. Again no follow up imaging is recommended for this lesion. Per consensus, no follow-up is needed for simple Bosniak type 1 and 2 renal cysts, unless the patient has a malignancy history or risk factors. Mild nonspecific bilateral perinephric stranding is unchanged. No stones in the kidneys or ureters. No hydronephrosis. No periureteral stranding. Urinary bladder: Gas within the bladder lumen is nonspecific, possibly related to recent catheterization. Urosepsis. GI AND BOWEL: Stomach demonstrates no acute abnormality. Appendix normal. The small bowel and large bowel  are otherwise unremarkable. There is no bowel obstruction. PERITONEUM AND  RETROPERITONEUM: No ascites. No free air. VASCULATURE: Aorta is normal in caliber. No aortic aneurysm. Mild aortoiliac atherosclerotic calcification. LYMPH NODES: Previously noted pathologic retroperitoneal and pelvic adenopathy has resolved. No pathologic residual adenopathy is seen within the abdomen and pelvis. REPRODUCTIVE ORGANS: No adnexal masses. BONES AND SOFT TISSUES: The osseous structures are diffusely osteopenic, progressive since prior examination. Tiny fat-containing umbilical hernia. IMPRESSION: 1. No acute findings in the abdomen or pelvis to explain abdominal pain. 2. Resolution of previously noted pathologic retroperitoneal and pelvic adenopathy, with no pathologic residual adenopathy in the abdomen or pelvis. 3. Gas within the bladder lumen, possibly related to recent catheterization. Correlation with clinical history is recommended. 4. Diffuse osteopenia, progressive since prior examination Electronically signed by: Dorethia Molt MD 04/29/2024 09:54 PM EST RP Workstation: [id number]   CT Head Wo Contrast Result Date: 04/29/2024 EXAM: CT HEAD WITHOUT CONTRAST 04/29/2024 07:26:02 PM TECHNIQUE: CT of the head was performed without the administration of intravenous contrast. Automated exposure control, iterative reconstruction, and/or weight based adjustment of the mA/kV was utilized to reduce the radiation dose to as low as reasonably achievable. COMPARISON: 03/01/2024 CLINICAL HISTORY: Mental status change, unknown cause FINDINGS: BRAIN AND VENTRICLES: No acute hemorrhage. No evidence of acute infarct. No hydrocephalus. No extra-axial collection. No mass effect or midline shift. Moderate vascular calcifications within the carotid siphons. ORBITS: No acute abnormality. SINUSES: No acute abnormality. SOFT TISSUES AND SKULL: No acute soft tissue abnormality. No skull fracture. IMPRESSION: 1. No acute intracranial  abnormality. Electronically signed by: Franky Stanford MD 04/29/2024 08:34 PM EST RP Workstation: [id number]     TODAY-DAY OF DISCHARGE:  Subjective:   Cathlean [name] today has no headache,no chest abdominal pain,no new weakness tingling or numbness, feels much better wants to go home today.   Objective:   Blood pressure (!) 148/85, pulse 86, temperature 97.6 F (36.4 C), temperature source Oral, resp. rate (!) 24, height 5' 4 (1.626 m), weight 69.2 kg, SpO2 94%.  Intake/Output Summary (Last 24 hours) at 05/03/2024 0941 Last data filed at 05/02/2024 2203 Gross per 24 hour  Intake 240 ml  Output 900 ml  Net -660 ml   Filed Weights   05/01/24 0500 05/03/24 0500  Weight: 68.1 kg 69.2 kg    Exam: Awake Alert, Oriented *3, No new F.N deficits, Normal affect [place].AT,PERRAL Supple Neck,No JVD, No cervical lymphadenopathy appriciated.  Symmetrical Chest wall movement, Good air movement bilaterally, CTAB RRR,No Gallops,Rubs or new Murmurs, No Parasternal Heave +ve B.Sounds, Abd Soft, Non tender, No organomegaly appriciated, No rebound -guarding or rigidity. No Cyanosis, Clubbing or edema, No new Rash or bruise   PERTINENT RADIOLOGIC STUDIES: VAS US  ABI WITH/WO TBI Result Date: 05/03/2024  LOWER EXTREMITY DOPPLER STUDY Patient Name:  [name]  Date of Exam:   05/01/2024 Medical Rec #: [id number]        Accession #:    [id number] Date of Birth: [birth year]        Patient Gender: F Patient Age:   53 years Exam Location:  [facility] Procedure:      VAS US  ABI WITH/WO TBI Referring Phys: DONALDA APPLEBAUM --------------------------------------------------------------------------------  Indications: Peripheral artery disease. High Risk Factors: Hypertension, hyperlipidemia, Diabetes, no history of                    smoking, prior CVA. Other Factors: CKD.  Comparison Study: Previous exam was on 04/08/2022 Performing Technologist: Ezzie Potters RVT, RDMS  Examination Guidelines: A complete  evaluation includes at minimum, Doppler waveform  signals and systolic blood pressure reading at the level of bilateral brachial, anterior tibial, and posterior tibial arteries, when vessel segments are accessible. Bilateral testing is considered an integral part of a complete examination. Photoelectric Plethysmograph (PPG) waveforms and toe systolic pressure readings are included as required and additional duplex testing as needed. Limited examinations for reoccurring indications may be performed as noted.  ABI Findings: +---------+------------------+-----+---------+--------+ Right    Rt Pressure (mmHg)IndexWaveform Comment  +---------+------------------+-----+---------+--------+ Brachial 167                    triphasic         +---------+------------------+-----+---------+--------+ PTA      162               0.97 biphasic          +---------+------------------+-----+---------+--------+ DP       149               0.89 biphasic          +---------+------------------+-----+---------+--------+ Great Toe136               0.81 Normal            +---------+------------------+-----+---------+--------+ +---------+------------------+-----+----------+----------------------+ Left     Lt Pressure (mmHg)IndexWaveform  Comment                +---------+------------------+-----+----------+----------------------+ Brachial                                  contracted (prior CVA) +---------+------------------+-----+----------+----------------------+ PTA      126               0.75 monophasic                       +---------+------------------+-----+----------+----------------------+ DP       120               0.72 monophasic                       +---------+------------------+-----+----------+----------------------+ Great Toe118               0.71 Abnormal                         +---------+------------------+-----+----------+----------------------+  +-------+-----------+-----------+------------+------------+ ABI/TBIToday's ABIToday's TBIPrevious ABIPrevious TBI +-------+-----------+-----------+------------+------------+ Right  0.97       0.80       0.94        0.80         +-------+-----------+-----------+------------+------------+ Left   0.75       0.71       0.64        0.61         +-------+-----------+-----------+------------+------------+   Summary: Right: Resting right ankle-brachial index is within normal range. The right toe-brachial index is normal.  Left: Resting left ankle-brachial index indicates moderate left lower extremity arterial disease. The left toe-brachial index is normal, however waveform is dampened.  *See table(s) above for measurements and observations.     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  Risk Factors: DM, HTN, HLD. Comparison Study: No priors. Performing Technologist: Ricka Sturdivant-Jones RDMS, RVT  Examination Guidelines: A complete evaluation includes B-mode imaging, spectral Doppler, color Doppler, and power Doppler as needed of all accessible portions of each vessel. Bilateral testing is considered an integral part of a complete examination. Limited examinations for reoccurring indications may be performed as noted. The reflux portion of the exam is performed with the patient in reverse Trendelenburg.   +---------+---------------+---------+-----------+----------+-------------------+ RIGHT    CompressibilityPhasicitySpontaneityPropertiesThrombus Aging      +---------+---------------+---------+-----------+----------+-------------------+ CFV      Full           Yes      Yes                                      +---------+---------------+---------+-----------+----------+-------------------+ SFJ      Full                                                             +---------+---------------+---------+-----------+----------+-------------------+ FV Prox  Full                                                             +---------+---------------+---------+-----------+----------+-------------------+ FV Mid   Full           Yes      Yes                                      +---------+---------------+---------+-----------+----------+-------------------+ FV DistalFull                                                             +---------+---------------+---------+-----------+----------+-------------------+ PFV      Full                                                             +---------+---------------+---------+-----------+----------+-------------------+ POP      Full           Yes      Yes                                      +---------+---------------+---------+-----------+----------+-------------------+ PTV      Full                                                             +---------+---------------+---------+-----------+----------+-------------------+  PERO                                                  not well visualized +---------+---------------+---------+-----------+----------+-------------------+   +---------+---------------+---------+-----------+----------+--------------+ LEFT     CompressibilityPhasicitySpontaneityPropertiesThrombus Aging +---------+---------------+---------+-----------+----------+--------------+ CFV      Full            Yes      Yes                                 +---------+---------------+---------+-----------+----------+--------------+ SFJ      Full                                                        +---------+---------------+---------+-----------+----------+--------------+ FV Prox  Full                                                        +---------+---------------+---------+-----------+----------+--------------+ FV Mid   Full           Yes      Yes                                 +---------+---------------+---------+-----------+----------+--------------+ FV DistalFull                                                        +---------+---------------+---------+-----------+----------+--------------+ PFV      Full                                                        +---------+---------------+---------+-----------+----------+--------------+ POP      Full           Yes      Yes                                 +---------+---------------+---------+-----------+----------+--------------+ PTV      Full                                                        +---------+---------------+---------+-----------+----------+--------------+ PERO     Full                                                        +---------+---------------+---------+-----------+----------+--------------+  Summary: RIGHT: - There is no evidence of deep vein thrombosis in the lower extremity. However, portions of this examination were limited- see technologist comments above.  - No cystic structure found in the popliteal fossa.  LEFT: - There is no evidence of deep vein thrombosis in the lower extremity.  - No cystic structure found in the popliteal fossa.  *See table(s) above for measurements and observations. Electronically signed by Penne Colorado MD on 05/01/2024 at 5:02:20 PM.    Final    ECHOCARDIOGRAM COMPLETE BUBBLE STUDY Result Date: 05/01/2024    ECHOCARDIOGRAM REPORT   Patient  Name:   [name] Date of Exam: 05/01/2024 Medical Rec #:  [id number]       Height:       64.0 in Accession #:    [id number]      Weight:       150.1 lb Date of Birth:  1960/07/29       BSA:          1.732 m Patient Age:    63 years        BP:           142/92 mmHg Patient Gender: F               HR:           108 bpm. Exam Location:  Inpatient Procedure: 2D Echo, Cardiac Doppler, Color Doppler, Intracardiac Opacification            Agent and Saline Contrast Bubble Study (Both Spectral and Color Flow            Doppler were utilized during procedure). Indications:    Stroke  History:        Patient has prior history of Echocardiogram examinations, most                 recent 05/31/2022. Risk Factors:Hypertension, Diabetes and                 Dyslipidemia.  Sonographer:    Sherlean Dubin Referring Phys: [id number] EKTA V PATEL  Sonographer Comments: Suboptimal apical window. Image acquisition challenging due to patient body habitus and Image acquisition challenging due to respiratory motion. IMPRESSIONS  1. Technically difficult; saline microcavitation study not interpretable.  2. Left ventricular ejection fraction, by estimation, is >75%. The left ventricle has hyperdynamic function. The left ventricle has no regional wall motion abnormalities. There is moderate left ventricular hypertrophy. Left ventricular diastolic parameters are consistent with Grade I diastolic dysfunction (impaired relaxation).  3. Right ventricular systolic function is normal. The right ventricular size is normal.  4. The mitral valve is normal in structure. No evidence of mitral valve regurgitation. No evidence of mitral stenosis.  5. The aortic valve is tricuspid. Aortic valve regurgitation is not visualized. No aortic stenosis is present.  6. The inferior vena cava is normal in size with greater than 50% respiratory variability, suggesting right atrial pressure of 3 mmHg. FINDINGS  Left Ventricle: Left ventricular ejection fraction, by  estimation, is >75%. The left ventricle has hyperdynamic function. The left ventricle has no regional wall motion abnormalities. Definity  contrast agent was given IV to delineate the left ventricular endocardial borders. The left ventricular internal cavity size was normal in size. There is moderate left ventricular hypertrophy. Left ventricular diastolic parameters are consistent with Grade I diastolic dysfunction (impaired relaxation). Right Ventricle: The right ventricular size is normal. Right ventricular systolic function is normal. Left Atrium: Left atrial size was normal in  size. Right Atrium: Right atrial size was normal in size. Pericardium: Trivial pericardial effusion is present. Mitral Valve: The mitral valve is normal in structure. No evidence of mitral valve regurgitation. No evidence of mitral valve stenosis. Tricuspid Valve: The tricuspid valve is normal in structure. Tricuspid valve regurgitation is trivial. No evidence of tricuspid stenosis. Aortic Valve: The aortic valve is tricuspid. Aortic valve regurgitation is not visualized. No aortic stenosis is present. Aortic valve peak gradient measures 6.4 mmHg. Pulmonic Valve: The pulmonic valve was not well visualized. Pulmonic valve regurgitation is not visualized. No evidence of pulmonic stenosis. Aorta: The aortic root is normal in size and structure. Venous: The inferior vena cava is normal in size with greater than 50% respiratory variability, suggesting right atrial pressure of 3 mmHg. IAS/Shunts: The interatrial septum was not well visualized. Additional Comments: Technically difficult; saline microcavitation study not interpretable.  LEFT VENTRICLE PLAX 2D LVIDd:         3.50 cm   Diastology LVIDs:         2.30 cm   LV e' medial:    6.42 cm/s LV PW:         1.00 cm   LV E/e' medial:  8.6 LV IVS:        1.20 cm   LV e' lateral:   6.96 cm/s LVOT diam:     2.00 cm   LV E/e' lateral: 7.9 LV SV:         47 LV SV Index:   27 LVOT Area:     3.14 cm   RIGHT VENTRICLE             IVC RV S prime:     22.40 cm/s  IVC diam: 1.50 cm TAPSE (M-mode): 1.3 cm LEFT ATRIUM             Index        RIGHT ATRIUM           Index LA diam:        3.00 cm 1.73 cm/m   RA Area:     13.00 cm LA Vol (A2C):   36.9 ml 21.31 ml/m  RA Volume:   32.10 ml  18.53 ml/m LA Vol (A4C):   31.5 ml 18.19 ml/m LA Biplane Vol: 35.0 ml 20.21 ml/m  AORTIC VALVE AV Area (Vmax): 2.44 cm AV Vmax:        126.00 cm/s AV Peak Grad:   6.4 mmHg LVOT Vmax:      97.70 cm/s LVOT Vmean:     63.200 cm/s LVOT VTI:       0.150 m  AORTA Ao Root diam: 2.70 cm Ao Asc diam:  3.00 cm MITRAL VALVE               TRICUSPID VALVE MV Area (PHT): 6.32 cm    TR Peak grad:   6.0 mmHg MV Decel Time: 120 msec    TR Vmax:        122.00 cm/s MV E velocity: 55.30 cm/s MV A velocity: 89.70 cm/s  SHUNTS MV E/A ratio:  0.62        Systemic VTI:  0.15 m                            Systemic Diam: 2.00 cm Redell Shallow MD Electronically signed by Redell Shallow MD Signature Date/Time: 05/01/2024/10:04:54 AM    Final      PERTINENT LAB RESULTS: CBC:  Recent Labs    05/01/24 0336 05/02/24 0342  WBC 6.0 5.4  HGB 11.2* 10.7*  HCT 34.1* 31.5*  PLT 238 225   CMET CMP     Component Value Date/Time   NA 143 05/02/2024 0342   K 3.4 (L) 05/02/2024 0342   CL 106 05/02/2024 0342   CO2 23 05/02/2024 0342   GLUCOSE 152 (H) 05/02/2024 0342   GLUCOSE 129 (H) 04/28/2006 0951   BUN 20 05/02/2024 0342   CREATININE 1.02 (H) 05/02/2024 0342   CREATININE 0.91 12/06/2023 1005   CALCIUM  10.4 (H) 05/02/2024 0342   PROT 7.4 05/01/2024 0336   ALBUMIN 3.7 05/01/2024 0336   AST 29 05/01/2024 0336   AST 20 12/06/2023 1005   ALT 24 05/01/2024 0336   ALT 25 12/06/2023 1005   ALKPHOS 79 05/01/2024 0336   BILITOT 0.4 05/01/2024 0336   BILITOT 0.3 12/06/2023 1005   GFR 81.99 12/19/2022 1100   GFRNONAA >60 05/02/2024 0342   GFRNONAA >60 12/06/2023 1005    GFR Estimated Creatinine Clearance: 53.2 mL/min (A) (by C-G formula  based on SCr of 1.02 mg/dL (H)). No results for input(s): LIPASE, AMYLASE in the last 72 hours. No results for input(s): CKTOTAL, CKMB, CKMBINDEX, TROPONINI in the last 72 hours. Invalid input(s): POCBNP No results for input(s): DDIMER in the last 72 hours. No results for input(s): HGBA1C in the last 72 hours. Recent Labs    05/01/24 0336  CHOL 138  HDL 51  LDLCALC 70  TRIG 87  CHOLHDL 2.7   No results for input(s): TSH, T4TOTAL, T3FREE, THYROIDAB in the last 72 hours.  Invalid input(s): FREET3 Recent Labs    04/30/24 1407 04/30/24 2029  VITAMINB12  --  1,694*  FOLATE  --  >20.0  FERRITIN 421*  --   TIBC 287  --   IRON 44  --    Coags: No results for input(s): INR in the last 72 hours.  Invalid input(s): PT Microbiology: Recent Results (from the past 240 hours)  Urine Culture     Status: Abnormal   Collection Time: 04/30/24  1:12 AM   Specimen: Urine, Random  Result Value Ref Range Status   Specimen Description URINE, RANDOM  Final   Special Requests   Final    NONE Reflexed from [id number] Performed at [facility], [address] [place], [place] [address]    Culture >=100,000 COLONIES/mL ESCHERICHIA COLI (A)  Final   Report Status 05/02/2024 FINAL  Final   Organism ID, Bacteria ESCHERICHIA COLI (A)  Final      Susceptibility   Escherichia coli - MIC*    AMPICILLIN  <=2 SENSITIVE Sensitive     CEFAZOLIN (URINE) Value in next row Sensitive      <=1 SENSITIVEThis is a modified FDA-approved test that has been validated and its performance characteristics determined by the reporting laboratory.  This laboratory is certified under the Clinical Laboratory Improvement Amendments CLIA as qualified to perform high complexity clinical laboratory testing.    CEFEPIME Value in next row Sensitive      <=1 SENSITIVEThis is a modified FDA-approved test that has been validated and its performance characteristics determined by the reporting  laboratory.  This laboratory is certified under the Clinical Laboratory Improvement Amendments CLIA as qualified to perform high complexity clinical laboratory testing.    ERTAPENEM Value in next row Sensitive      <=1 SENSITIVEThis is a modified FDA-approved test that has been validated and its performance characteristics determined  by the reporting laboratory.  This laboratory is certified under the Clinical Laboratory Improvement Amendments CLIA as qualified to perform high complexity clinical laboratory testing.    CEFTRIAXONE  Value in next row Sensitive      <=1 SENSITIVEThis is a modified FDA-approved test that has been validated and its performance characteristics determined by the reporting laboratory.  This laboratory is certified under the Clinical Laboratory Improvement Amendments CLIA as qualified to perform high complexity clinical laboratory testing.    CIPROFLOXACIN  Value in next row Resistant      <=1 SENSITIVEThis is a modified FDA-approved test that has been validated and its performance characteristics determined by the reporting laboratory.  This laboratory is certified under the Clinical Laboratory Improvement Amendments CLIA as qualified to perform high complexity clinical laboratory testing.    GENTAMICIN Value in next row Sensitive      <=1 SENSITIVEThis is a modified FDA-approved test that has been validated and its performance characteristics determined by the reporting laboratory.  This laboratory is certified under the Clinical Laboratory Improvement Amendments CLIA as qualified to perform high complexity clinical laboratory testing.    NITROFURANTOIN Value in next row Sensitive      <=1 SENSITIVEThis is a modified FDA-approved test that has been validated and its performance characteristics determined by the reporting laboratory.  This laboratory is certified under the Clinical Laboratory Improvement Amendments CLIA as qualified to perform high complexity clinical laboratory  testing.    TRIMETH/SULFA Value in next row Sensitive      <=1 SENSITIVEThis is a modified FDA-approved test that has been validated and its performance characteristics determined by the reporting laboratory.  This laboratory is certified under the Clinical Laboratory Improvement Amendments CLIA as qualified to perform high complexity clinical laboratory testing.    AMPICILLIN /SULBACTAM Value in next row Sensitive      <=1 SENSITIVEThis is a modified FDA-approved test that has been validated and its performance characteristics determined by the reporting laboratory.  This laboratory is certified under the Clinical Laboratory Improvement Amendments CLIA as qualified to perform high complexity clinical laboratory testing.    PIP/TAZO Value in next row Sensitive      <=4 SENSITIVEThis is a modified FDA-approved test that has been validated and its performance characteristics determined by the reporting laboratory.  This laboratory is certified under the Clinical Laboratory Improvement Amendments CLIA as qualified to perform high complexity clinical laboratory testing.    MEROPENEM Value in next row Sensitive      <=4 SENSITIVEThis is a modified FDA-approved test that has been validated and its performance characteristics determined by the reporting laboratory.  This laboratory is certified under the Clinical Laboratory Improvement Amendments CLIA as qualified to perform high complexity clinical laboratory testing.    * >=100,000 COLONIES/mL ESCHERICHIA COLI    FURTHER DISCHARGE INSTRUCTIONS:  Get Medicines reviewed and adjusted: Please take all your medications with you for your next visit with your Primary MD  Laboratory/radiological data: Please request your Primary MD to go over all hospital tests and procedure/radiological results at the follow up, please ask your Primary MD to get all Hospital records sent to his/her office.  In some cases, they will be blood work, cultures and biopsy results  pending at the time of your discharge. Please request that your primary care M.D. goes through all the records of your hospital data and follows up on these results.  Also Note the following: If you experience worsening of your admission symptoms, develop shortness of breath, life threatening emergency, suicidal  or homicidal thoughts you must seek medical attention immediately by calling 911 or calling your MD immediately  if symptoms less severe.  You must read complete instructions/literature along with all the possible adverse reactions/side effects for all the Medicines you take and that have been prescribed to you. Take any new Medicines after you have completely understood and accpet all the possible adverse reactions/side effects.   Do not drive when taking Pain medications or sleeping medications (Benzodaizepines)  Do not take more than prescribed Pain, Sleep and Anxiety Medications. It is not advisable to combine anxiety,sleep and pain medications without talking with your primary care practitioner  Special Instructions: If you have smoked or chewed Tobacco  in the last 2 yrs please stop smoking, stop any regular Alcohol  and or any Recreational drug use.  Wear Seat belts while driving.  Please note: You were cared for by a hospitalist during your hospital stay. Once you are discharged, your primary care physician will handle any further medical issues. Please note that NO REFILLS for any discharge medications will be authorized once you are discharged, as it is imperative that you return to your primary care physician (or establish a relationship with a primary care physician if you do not have one) for your post hospital discharge needs so that they can reassess your need for medications and monitor your lab values.  Total Time spent coordinating discharge including counseling, education and face to face time equals greater than 30 minutes.  SignedBETHA Donalda Applebaum 05/03/2024 9:41  AM      [1] No Known Allergies  "

## 2024-05-03 NOTE — Care Management Important Message (Signed)
 Important Message  Patient Details  Name: [name] MRN: [id number] Date of Birth: 1960/11/19   Important Message Given:  Yes - Medicare IM     Claretta Deed 05/03/2024, 3:47 PM

## 2024-05-03 NOTE — Plan of Care (Signed)
" °  Problem: Education: Goal: Ability to describe self-care measures that may prevent or decrease complications (Diabetes Survival Skills Education) will improve Outcome: Progressing   Problem: Coping: Goal: Ability to adjust to condition or change in health will improve Outcome: Progressing   Problem: Nutritional: Goal: Maintenance of adequate nutrition will improve Outcome: Progressing   Problem: Activity: Goal: Risk for activity intolerance will decrease Outcome: Progressing   Problem: Pain Managment: Goal: General experience of comfort will improve and/or be controlled Outcome: Progressing   Problem: Safety: Goal: Ability to remain free from injury will improve Outcome: Progressing   Problem: Education: Goal: Knowledge of disease or condition will improve Outcome: Progressing   Problem: Ischemic Stroke/TIA Tissue Perfusion: Goal: Complications of ischemic stroke/TIA will be minimized Outcome: Progressing   Problem: Coping: Goal: Will verbalize positive feelings about self Outcome: Progressing   Problem: Health Behavior/Discharge Planning: Goal: Ability to manage health-related needs will improve Outcome: Progressing   "

## 2024-05-03 NOTE — Plan of Care (Signed)
  Problem: Education: Goal: Ability to describe self-care measures that may prevent or decrease complications (Diabetes Survival Skills Education) will improve Outcome: Progressing   Problem: Coping: Goal: Ability to adjust to condition or change in health will improve Outcome: Progressing   Problem: Nutritional: Goal: Maintenance of adequate nutrition will improve Outcome: Progressing   Problem: Activity: Goal: Risk for activity intolerance will decrease Outcome: Progressing   Problem: Pain Managment: Goal: General experience of comfort will improve and/or be controlled Outcome: Progressing   Problem: Safety: Goal: Ability to remain free from injury will improve Outcome: Progressing

## 2024-05-03 NOTE — Care Management Important Message (Signed)
 Important Message  Patient Details  Name: [name] MRN: [id number] Date of Birth: 1960/08/28   Important Message Given:  Yes - Medicare IM     Claretta Deed 05/03/2024, 3:46 PM

## 2024-05-04 LAB — VAS US ABI WITH/WO TBI
Left ABI: 0.75
Right ABI: 0.97

## 2024-05-19 ENCOUNTER — Inpatient Hospital Stay (HOSPITAL_COMMUNITY)
Admission: EM | Admit: 2024-05-19 | Discharge: 2024-05-24 | DRG: 871 | Disposition: A | Discharge status: Skilled Nursing Facility | Source: Skilled Nursing Facility | Attending: Internal Medicine | Admitting: Internal Medicine

## 2024-05-19 ENCOUNTER — Emergency Department (HOSPITAL_COMMUNITY)

## 2024-05-19 ENCOUNTER — Other Ambulatory Visit: Payer: Self-pay

## 2024-05-19 ENCOUNTER — Encounter (HOSPITAL_COMMUNITY): Payer: Self-pay

## 2024-05-19 DIAGNOSIS — K219 Gastro-esophageal reflux disease without esophagitis: Secondary | ICD-10-CM | POA: Diagnosis present

## 2024-05-19 DIAGNOSIS — Z1152 Encounter for screening for COVID-19: Secondary | ICD-10-CM | POA: Diagnosis not present

## 2024-05-19 DIAGNOSIS — C8590 Non-Hodgkin lymphoma, unspecified, unspecified site: Secondary | ICD-10-CM | POA: Diagnosis present

## 2024-05-19 DIAGNOSIS — Z794 Long term (current) use of insulin: Secondary | ICD-10-CM | POA: Diagnosis not present

## 2024-05-19 DIAGNOSIS — J690 Pneumonitis due to inhalation of food and vomit: Secondary | ICD-10-CM | POA: Diagnosis present

## 2024-05-19 DIAGNOSIS — E039 Hypothyroidism, unspecified: Secondary | ICD-10-CM | POA: Diagnosis present

## 2024-05-19 DIAGNOSIS — Z90710 Acquired absence of both cervix and uterus: Secondary | ICD-10-CM

## 2024-05-19 DIAGNOSIS — D649 Anemia, unspecified: Secondary | ICD-10-CM | POA: Diagnosis present

## 2024-05-19 DIAGNOSIS — N179 Acute kidney failure, unspecified: Secondary | ICD-10-CM | POA: Diagnosis present

## 2024-05-19 DIAGNOSIS — Z800 Family history of malignant neoplasm of digestive organs: Secondary | ICD-10-CM

## 2024-05-19 DIAGNOSIS — G610 Guillain-Barre syndrome: Secondary | ICD-10-CM | POA: Diagnosis present

## 2024-05-19 DIAGNOSIS — A419 Sepsis, unspecified organism: Principal | ICD-10-CM | POA: Diagnosis present

## 2024-05-19 DIAGNOSIS — Z7984 Long term (current) use of oral hypoglycemic drugs: Secondary | ICD-10-CM

## 2024-05-19 DIAGNOSIS — F32A Depression, unspecified: Secondary | ICD-10-CM | POA: Diagnosis present

## 2024-05-19 DIAGNOSIS — E1140 Type 2 diabetes mellitus with diabetic neuropathy, unspecified: Secondary | ICD-10-CM | POA: Diagnosis present

## 2024-05-19 DIAGNOSIS — G9341 Metabolic encephalopathy: Secondary | ICD-10-CM | POA: Diagnosis present

## 2024-05-19 DIAGNOSIS — E11649 Type 2 diabetes mellitus with hypoglycemia without coma: Secondary | ICD-10-CM | POA: Diagnosis present

## 2024-05-19 DIAGNOSIS — J9601 Acute respiratory failure with hypoxia: Secondary | ICD-10-CM | POA: Diagnosis present

## 2024-05-19 DIAGNOSIS — T17908A Unspecified foreign body in respiratory tract, part unspecified causing other injury, initial encounter: Secondary | ICD-10-CM

## 2024-05-19 DIAGNOSIS — J9811 Atelectasis: Secondary | ICD-10-CM | POA: Diagnosis present

## 2024-05-19 DIAGNOSIS — R1310 Dysphagia, unspecified: Secondary | ICD-10-CM | POA: Diagnosis present

## 2024-05-19 DIAGNOSIS — I69354 Hemiplegia and hemiparesis following cerebral infarction affecting left non-dominant side: Secondary | ICD-10-CM

## 2024-05-19 DIAGNOSIS — J189 Pneumonia, unspecified organism: Secondary | ICD-10-CM | POA: Diagnosis present

## 2024-05-19 DIAGNOSIS — E785 Hyperlipidemia, unspecified: Secondary | ICD-10-CM | POA: Diagnosis present

## 2024-05-19 DIAGNOSIS — Z79899 Other long term (current) drug therapy: Secondary | ICD-10-CM

## 2024-05-19 DIAGNOSIS — Z7982 Long term (current) use of aspirin: Secondary | ICD-10-CM

## 2024-05-19 DIAGNOSIS — E872 Acidosis, unspecified: Secondary | ICD-10-CM | POA: Diagnosis present

## 2024-05-19 DIAGNOSIS — Z833 Family history of diabetes mellitus: Secondary | ICD-10-CM

## 2024-05-19 DIAGNOSIS — I10 Essential (primary) hypertension: Secondary | ICD-10-CM | POA: Diagnosis present

## 2024-05-19 DIAGNOSIS — R680 Hypothermia, not associated with low environmental temperature: Secondary | ICD-10-CM | POA: Diagnosis present

## 2024-05-19 DIAGNOSIS — E870 Hyperosmolality and hypernatremia: Secondary | ICD-10-CM | POA: Diagnosis not present

## 2024-05-19 DIAGNOSIS — E1142 Type 2 diabetes mellitus with diabetic polyneuropathy: Secondary | ICD-10-CM | POA: Diagnosis present

## 2024-05-19 DIAGNOSIS — R6520 Severe sepsis without septic shock: Secondary | ICD-10-CM | POA: Diagnosis present

## 2024-05-19 DIAGNOSIS — Z825 Family history of asthma and other chronic lower respiratory diseases: Secondary | ICD-10-CM

## 2024-05-19 DIAGNOSIS — D72819 Decreased white blood cell count, unspecified: Secondary | ICD-10-CM | POA: Diagnosis present

## 2024-05-19 DIAGNOSIS — E048 Other specified nontoxic goiter: Secondary | ICD-10-CM | POA: Diagnosis present

## 2024-05-19 DIAGNOSIS — E876 Hypokalemia: Secondary | ICD-10-CM | POA: Diagnosis not present

## 2024-05-19 DIAGNOSIS — Z823 Family history of stroke: Secondary | ICD-10-CM

## 2024-05-19 DIAGNOSIS — Z9889 Other specified postprocedural states: Secondary | ICD-10-CM

## 2024-05-19 DIAGNOSIS — Z87440 Personal history of urinary (tract) infections: Secondary | ICD-10-CM

## 2024-05-19 DIAGNOSIS — Z8249 Family history of ischemic heart disease and other diseases of the circulatory system: Secondary | ICD-10-CM

## 2024-05-19 LAB — CBC WITH DIFFERENTIAL/PLATELET
Abs Immature Granulocytes: 0.01 K/uL (ref 0.00–0.07)
Basophils Absolute: 0 K/uL (ref 0.0–0.1)
Basophils Relative: 0 %
Eosinophils Absolute: 0 K/uL (ref 0.0–0.5)
Eosinophils Relative: 1 %
HCT: 31 % — ABNORMAL LOW (ref 36.0–46.0)
Hemoglobin: 9.9 g/dL — ABNORMAL LOW (ref 12.0–15.0)
Immature Granulocytes: 0 %
Lymphocytes Relative: 22 %
Lymphs Abs: 0.7 K/uL (ref 0.7–4.0)
MCH: 27.7 pg (ref 26.0–34.0)
MCHC: 31.9 g/dL (ref 30.0–36.0)
MCV: 86.6 fL (ref 80.0–100.0)
Monocytes Absolute: 0.2 K/uL (ref 0.1–1.0)
Monocytes Relative: 7 %
Neutro Abs: 2.3 K/uL (ref 1.7–7.7)
Neutrophils Relative %: 70 %
Platelets: 248 K/uL (ref 150–400)
RBC: 3.58 MIL/uL — ABNORMAL LOW (ref 3.87–5.11)
RDW: 16.8 % — ABNORMAL HIGH (ref 11.5–15.5)
WBC: 3.3 K/uL — ABNORMAL LOW (ref 4.0–10.5)
nRBC: 0 % (ref 0.0–0.2)

## 2024-05-19 LAB — I-STAT VENOUS BLOOD GAS, ED
Acid-Base Excess: 0 mmol/L (ref 0.0–2.0)
Bicarbonate: 27.2 mmol/L (ref 20.0–28.0)
Calcium, Ion: 1.32 mmol/L (ref 1.15–1.40)
HCT: 30 % — ABNORMAL LOW (ref 36.0–46.0)
Hemoglobin: 10.2 g/dL — ABNORMAL LOW (ref 12.0–15.0)
O2 Saturation: 92 %
Potassium: 4.1 mmol/L (ref 3.5–5.1)
Sodium: 145 mmol/L (ref 135–145)
TCO2: 29 mmol/L (ref 22–32)
pCO2, Ven: 53 mmHg (ref 44–60)
pH, Ven: 7.318 (ref 7.25–7.43)
pO2, Ven: 72 mmHg — ABNORMAL HIGH (ref 32–45)

## 2024-05-19 LAB — COMPREHENSIVE METABOLIC PANEL WITH GFR
ALT: 22 U/L (ref 0–44)
AST: 23 U/L (ref 15–41)
Albumin: 3.6 g/dL (ref 3.5–5.0)
Alkaline Phosphatase: 72 U/L (ref 38–126)
Anion gap: 12 (ref 5–15)
BUN: 67 mg/dL — ABNORMAL HIGH (ref 8–23)
CO2: 26 mmol/L (ref 22–32)
Calcium: 10.3 mg/dL (ref 8.9–10.3)
Chloride: 107 mmol/L (ref 98–111)
Creatinine, Ser: 1.29 mg/dL — ABNORMAL HIGH (ref 0.44–1.00)
GFR, Estimated: 46 mL/min — ABNORMAL LOW
Glucose, Bld: 82 mg/dL (ref 70–99)
Potassium: 4.2 mmol/L (ref 3.5–5.1)
Sodium: 145 mmol/L (ref 135–145)
Total Bilirubin: 0.3 mg/dL (ref 0.0–1.2)
Total Protein: 7.2 g/dL (ref 6.5–8.1)

## 2024-05-19 LAB — CBC
HCT: 28.4 % — ABNORMAL LOW (ref 36.0–46.0)
Hemoglobin: 9 g/dL — ABNORMAL LOW (ref 12.0–15.0)
MCH: 27.3 pg (ref 26.0–34.0)
MCHC: 31.7 g/dL (ref 30.0–36.0)
MCV: 86.1 fL (ref 80.0–100.0)
Platelets: 208 K/uL (ref 150–400)
RBC: 3.3 MIL/uL — ABNORMAL LOW (ref 3.87–5.11)
RDW: 16.9 % — ABNORMAL HIGH (ref 11.5–15.5)
WBC: 5.8 K/uL (ref 4.0–10.5)
nRBC: 0 % (ref 0.0–0.2)

## 2024-05-19 LAB — I-STAT CHEM 8, ED
BUN: 62 mg/dL — ABNORMAL HIGH (ref 8–23)
Calcium, Ion: 1.31 mmol/L (ref 1.15–1.40)
Chloride: 107 mmol/L (ref 98–111)
Creatinine, Ser: 1.6 mg/dL — ABNORMAL HIGH (ref 0.44–1.00)
Glucose, Bld: 77 mg/dL (ref 70–99)
HCT: 29 % — ABNORMAL LOW (ref 36.0–46.0)
Hemoglobin: 9.9 g/dL — ABNORMAL LOW (ref 12.0–15.0)
Potassium: 4.1 mmol/L (ref 3.5–5.1)
Sodium: 145 mmol/L (ref 135–145)
TCO2: 26 mmol/L (ref 22–32)

## 2024-05-19 LAB — RESP PANEL BY RT-PCR (RSV, FLU A&B, COVID)  RVPGX2
Influenza A by PCR: NEGATIVE
Influenza B by PCR: NEGATIVE
Resp Syncytial Virus by PCR: NEGATIVE
SARS Coronavirus 2 by RT PCR: NEGATIVE

## 2024-05-19 LAB — LACTIC ACID, PLASMA
Lactic Acid, Venous: 2.6 mmol/L (ref 0.5–1.9)
Lactic Acid, Venous: 1.2 mmol/L (ref 0.5–1.9)

## 2024-05-19 LAB — CBG MONITORING, ED
Glucose-Capillary: 75 mg/dL (ref 70–99)
Glucose-Capillary: 132 mg/dL — ABNORMAL HIGH (ref 70–99)
Glucose-Capillary: 172 mg/dL — ABNORMAL HIGH (ref 70–99)

## 2024-05-19 LAB — CREATININE, SERUM
Creatinine, Ser: 1.24 mg/dL — ABNORMAL HIGH (ref 0.44–1.00)
GFR, Estimated: 48 mL/min — ABNORMAL LOW

## 2024-05-19 LAB — I-STAT CG4 LACTIC ACID, ED: Lactic Acid, Venous: 2.1 mmol/L (ref 0.5–1.9)

## 2024-05-19 MED ORDER — PIPERACILLIN-TAZOBACTAM 3.375 G IVPB 30 MIN
3.3750 g | Freq: Once | INTRAVENOUS | Status: AC
  Administered 2024-05-19: 3.375 g via INTRAVENOUS
  Filled 2024-05-19: qty 50

## 2024-05-19 MED ORDER — ACETAMINOPHEN 650 MG RE SUPP: 650.0000 mg | Freq: Four times a day (QID) | RECTAL | Status: DC | PRN

## 2024-05-19 MED ORDER — PIPERACILLIN-TAZOBACTAM 4.5 G IVPB: 4.5000 g | Freq: Once | INTRAVENOUS | Status: DC

## 2024-05-19 MED ORDER — HEPARIN SODIUM (PORCINE) 5000 UNIT/ML IJ SOLN
5000.0000 [IU] | Freq: Three times a day (TID) | INTRAMUSCULAR | Status: DC
  Administered 2024-05-19 – 2024-05-24 (×14): 5000 [IU] via SUBCUTANEOUS
  Filled 2024-05-19 (×14): qty 1

## 2024-05-19 MED ORDER — SODIUM CHLORIDE 0.9 % IV SOLN
3.0000 g | Freq: Four times a day (QID) | INTRAVENOUS | Status: DC
  Administered 2024-05-19 – 2024-05-24 (×17): 3 g via INTRAVENOUS
  Filled 2024-05-19 (×18): qty 8

## 2024-05-19 MED ORDER — ALBUTEROL SULFATE (2.5 MG/3ML) 0.083% IN NEBU
2.5000 mg | INHALATION_SOLUTION | Freq: Four times a day (QID) | RESPIRATORY_TRACT | Status: DC
  Administered 2024-05-19 – 2024-05-20 (×4): 2.5 mg via RESPIRATORY_TRACT
  Filled 2024-05-19 (×5): qty 3

## 2024-05-19 MED ORDER — INSULIN ASPART 100 UNIT/ML IJ SOLN
0.0000 [IU] | Freq: Three times a day (TID) | INTRAMUSCULAR | Status: DC
  Administered 2024-05-20 (×2): 2 [IU] via SUBCUTANEOUS
  Administered 2024-05-21: 3 [IU] via SUBCUTANEOUS
  Administered 2024-05-21: 5 [IU] via SUBCUTANEOUS
  Administered 2024-05-22: 2 [IU] via SUBCUTANEOUS
  Administered 2024-05-22 – 2024-05-24 (×5): 3 [IU] via SUBCUTANEOUS
  Administered 2024-05-24: 2 [IU] via SUBCUTANEOUS
  Filled 2024-05-19: qty 3
  Filled 2024-05-19: qty 1
  Filled 2024-05-19: qty 3
  Filled 2024-05-19: qty 1
  Filled 2024-05-19: qty 2
  Filled 2024-05-19 (×4): qty 3
  Filled 2024-05-19: qty 5
  Filled 2024-05-19: qty 2

## 2024-05-19 MED ORDER — VANCOMYCIN HCL 1250 MG/250ML IV SOLN
1250.0000 mg | Freq: Once | INTRAVENOUS | Status: AC
  Administered 2024-05-19: 1250 mg via INTRAVENOUS
  Filled 2024-05-19: qty 250

## 2024-05-19 MED ORDER — INSULIN ASPART 100 UNIT/ML IJ SOLN: 0.0000 [IU] | Freq: Every day | INTRAMUSCULAR | Status: DC

## 2024-05-19 MED ORDER — ONDANSETRON HCL 4 MG/2ML IJ SOLN: 4.0000 mg | Freq: Four times a day (QID) | INTRAMUSCULAR | Status: DC | PRN

## 2024-05-19 MED ORDER — ACETAMINOPHEN 325 MG PO TABS
650.0000 mg | ORAL_TABLET | Freq: Four times a day (QID) | ORAL | Status: DC | PRN
  Administered 2024-05-21 – 2024-05-24 (×3): 650 mg via ORAL
  Filled 2024-05-19 (×3): qty 2

## 2024-05-19 MED ORDER — LACTATED RINGERS IV BOLUS
1000.0000 mL | Freq: Once | INTRAVENOUS | Status: AC
  Administered 2024-05-19: 1000 mL via INTRAVENOUS

## 2024-05-19 MED ORDER — ONDANSETRON HCL 4 MG PO TABS: 4.0000 mg | ORAL_TABLET | Freq: Four times a day (QID) | ORAL | Status: DC | PRN

## 2024-05-19 MED ORDER — LACTATED RINGERS IV SOLN
150.0000 mL/h | INTRAVENOUS | Status: DC
  Administered 2024-05-19 – 2024-05-20 (×3): 150 mL/h via INTRAVENOUS

## 2024-05-19 NOTE — ED Notes (Signed)
 Date and time results received: 05/19/24 8:03 PM   Test: Lactic acid Critical Value: 2.6  Name of Provider Notified: R. Pahwani  Orders Received? Or Actions Taken?: Awaiting orders

## 2024-05-19 NOTE — ED Triage Notes (Signed)
 Patient arrives via [facility] from [place] for respiratory distress. Patient room air sat 78% now up to 95% on NRB at 15L. Patient with CBG at facility to be 50. Two oral glucose packs admin. Patient with hx of stroke left side deficits, guillain barre. Patient suctioned by EMS prior to arrival.   EMS vitals BP 110/70 HR 78 CBG 122

## 2024-05-19 NOTE — ED Notes (Signed)
 Bair hugger applied.

## 2024-05-19 NOTE — ED Provider Notes (Signed)
 " [place] [facility] Provider Note   CSN: [id number] Arrival date & time: 05/19/24  1553     Patient presents with: Respiratory Distress   [name] is a 64 y.o. female with a PMHx of CVA-left-sided residual hemiparesis, HTN, HLD, DM-2, non-Hodgkin's lymphoma who presents today for evaluation of respiratory distress.  Patient is not able to contribute significantly to history.  Per EMS report patient developed respiratory distress this morning initially treated with albuterol  at her facility without significant improvement.  Was also noted to be hypoglycemic to 50..  In an attempt to correct this they gave her oral glucose and she subsequently aspirated on this.  EMS was subsequently called and initially noted to be hypoxic in the 70s.  Placed on a nonrebreather mask due to hypoxia.   HPI     Prior to Admission medications  Medication Sig Start Date End Date Taking? Authorizing Provider  acetaminophen  (TYLENOL ) 325 MG tablet Take 650 mg by mouth every 4 (four) hours as needed for mild pain (pain score 1-3) or fever. Do not exceed 3000mg  in 24 hours    [provider]  amLODipine  (NORVASC ) 10 MG tablet Take 1 tablet by mouth once daily 05/09/22   Jordan, [name], MD  ammonium lactate (LAC-HYDRIN) 12 % lotion Apply 1 Application topically daily. Apply to bilateral feet and lower legs topically every day shift for dry skin    [provider]  aspirin  EC 81 MG tablet Take 1 tablet (81 mg total) by mouth daily. Swallow whole. 06/18/22   Krishnan, Gokul, MD  atorvastatin  (LIPITOR ) 80 MG tablet Take 1 tablet (80 mg total) by mouth daily. 05/09/22   Jordan, [name], MD  baclofen  5 MG TABS Take 4 tablets (20 mg total) by mouth 3 (three) times daily as needed for muscle spasms. Patient taking differently: Take 20 mg by mouth 3 (three) times daily. 03/06/24   Singh, Prashant K, MD  Benzocaine-Menthol  (ORAJEL 2X TOOTHACHE & GUM) 20-0.26 % GEL Use  as directed 1 Application in the mouth or throat every 4 (four) hours as needed.    [provider]  busPIRone  (BUSPAR ) 10 MG tablet Take 10 mg by mouth 2 (two) times daily. 02/29/24   [provider]  carvedilol  (COREG ) 12.5 MG tablet Take 1 tablet (12.5 mg total) by mouth 2 (two) times daily with a meal. 05/03/24   Ghimire, Donalda HERO, MD  cloNIDine  (CATAPRES  - DOSED IN MG/24 HR) 0.1 mg/24hr patch Place 0.1 mg onto the skin every Saturday. 08/28/23   [provider]  Cranberry (SM CRANBERRY) 300 MG tablet Take 300 mg by mouth 2 (two) times daily.    [provider]  ezetimibe  (ZETIA ) 10 MG tablet Take 1 tablet (10 mg total) by mouth daily. 05/09/22   Jordan, [name], MD  ferrous sulfate 325 (65 FE) MG tablet Take 325 mg by mouth daily at 12 noon.    [provider]  FLUoxetine  (PROZAC ) 40 MG capsule Take 40 mg by mouth daily. 04/25/23   [provider]  gabapentin  (NEURONTIN ) 100 MG capsule Take 2 capsules (200 mg total) by mouth 3 (three) times daily. 05/03/24   Ghimire, Donalda HERO, MD  guaiFENesin  (ROBITUSSIN) 100 MG/5ML liquid Take 10 mLs by mouth every 4 (four) hours as needed for cough or to loosen phlegm.    [provider]  hydrochlorothiazide  (HYDRODIURIL ) 12.5 MG tablet Take 1 tablet (12.5 mg total) by mouth daily. 03/09/24  Singh, Prashant K, MD  insulin  aspart (NOVOLOG ) 100 UNIT/ML injection Inject 0-10 Units into the skin 3 (three) times daily before meals. Before each meal 3 times a day, 140-199 - 4 units, 200-250 - 6 units, 251-299 - 8 units,  300-349 - 10 units,  350 or above 12 units. Patient taking differently: Inject 0-12 Units into the skin 3 (three) times daily before meals. Before each meal 3 times a day, 140-199 - 4 units, 200-250 - 6 units, 251-299 - 8 units,  300-349 - 10 units,  350 or above 12 units. 03/06/24   Singh, Prashant K, MD  Lidocaine  HCl (ASPERCREME LIDOCAINE ) 4 % CREA Apply 1 Application topically daily as needed  (bilateral feet pain).    [provider]  melatonin 5 MG TABS Take 5 mg by mouth at bedtime as needed (insomnia).    [provider]  metFORMIN  (GLUCOPHAGE ) 500 MG tablet Take 500 mg by mouth 2 (two) times daily with a meal.    [provider]  oxyCODONE  (OXY IR/ROXICODONE ) 5 MG immediate release tablet Take 1 tablet (5 mg total) by mouth every 6 (six) hours as needed for moderate pain (pain score 4-6). 05/03/24   Ghimire, Donalda HERO, MD  pantoprazole  (PROTONIX ) 40 MG tablet Take 1 tablet (40 mg total) by mouth daily. 10/17/22 11/29/24  Vernon Ranks, MD  senna-docusate (SENOKOT-S) 8.6-50 MG tablet Take 1 tablet by mouth daily as needed for mild constipation.    [provider]  venetoclax  (VENCLEXTA ) 100 MG tablet Take 2 tablets (200 mg total) by mouth daily. Tablets should be swallowed whole with a meal and a full glass of water. 04/23/24 05/27/24  Onesimo Emaline Brink, MD    Allergies: Patient has no known allergies.    Review of Systems  Updated Vital Signs BP 134/82   Pulse 74   Resp (!) 21   Ht 5' 4 (1.626 m)   Wt 69 kg   SpO2 97%   BMI 26.11 kg/m   Physical Exam Constitutional:      Appearance: She is ill-appearing.  HENT:     Head: Normocephalic.     Right Ear: External ear normal.     Left Ear: External ear normal.     Nose: Nose normal.     Mouth/Throat:     Mouth: Mucous membranes are moist.  Eyes:     Pupils: Pupils are equal, round, and reactive to light.  Cardiovascular:     Rate and Rhythm: Normal rate and regular rhythm.     Pulses: Normal pulses.  Pulmonary:     Effort: Respiratory distress present.     Breath sounds: Rhonchi present.  Abdominal:     General: Abdomen is flat.  Musculoskeletal:     Cervical back: Normal range of motion.  Skin:    General: Skin is warm.     Capillary Refill: Capillary refill takes less than 2 seconds.  Neurological:     Mental Status: She is alert. Mental status is at baseline.      Comments: Somnolent      (all labs ordered are listed, but only abnormal results are displayed) Labs Reviewed  CBC WITH DIFFERENTIAL/PLATELET - Abnormal; Notable for the following components:      Result Value   WBC 3.3 (*)    RBC 3.58 (*)    Hemoglobin 9.9 (*)    HCT 31.0 (*)    RDW 16.8 (*)    All other components within normal limits  COMPREHENSIVE  METABOLIC PANEL WITH GFR - Abnormal; Notable for the following components:   BUN 67 (*)    Creatinine, Ser 1.29 (*)    GFR, Estimated 46 (*)    All other components within normal limits  I-STAT CHEM 8, ED - Abnormal; Notable for the following components:   BUN 62 (*)    Creatinine, Ser 1.60 (*)    Hemoglobin 9.9 (*)    HCT 29.0 (*)    All other components within normal limits  I-STAT VENOUS BLOOD GAS, ED - Abnormal; Notable for the following components:   pO2, Ven 72 (*)    HCT 30.0 (*)    Hemoglobin 10.2 (*)    All other components within normal limits  I-STAT CG4 LACTIC ACID, ED - Abnormal; Notable for the following components:   Lactic Acid, Venous 2.1 (*)    All other components within normal limits  RESP PANEL BY RT-PCR (RSV, FLU A&B, COVID)  RVPGX2  CULTURE, BLOOD (ROUTINE X 2)  CULTURE, BLOOD (ROUTINE X 2)  URINALYSIS, ROUTINE W REFLEX MICROSCOPIC  CBC  CREATININE, SERUM  COMPREHENSIVE METABOLIC PANEL WITH GFR  CBC  CBG MONITORING, ED    EKG: None  Radiology: CT Head Wo Contrast Result Date: 05/19/2024 EXAM: CT HEAD WITHOUT CONTRAST 05/19/2024 04:56:11 PM TECHNIQUE: CT of the head was performed without the administration of intravenous contrast. Automated exposure control, iterative reconstruction, and/or weight based adjustment of the mA/kV was utilized to reduce the radiation dose to as low as reasonably achievable. COMPARISON: CT head without contrast 04/29/2024 and MR head without contrast 04/30/2024. CLINICAL HISTORY: Mental status change, unknown cause. FINDINGS: BRAIN AND VENTRICLES: No acute hemorrhage. No  evidence of acute infarct. 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 No hydrocephalus. No extra-axial collection. No mass effect or midline shift. Atherosclerotic calcifications within the cavernous internal carotid arteries. ORBITS: No acute abnormality. SINUSES: No acute abnormality. SOFT TISSUES AND SKULL: No acute soft tissue abnormality. No skull fracture. IMPRESSION: 1. No acute intracranial abnormality. 2. Stable remote lacunar infarcts of the cerebellum. Electronically signed by: Lonni Necessary MD 05/19/2024 05:21 PM EST RP Workstation: [id number]   DG Chest Portable 1 View Result Date: 05/19/2024 EXAM: 1 VIEW(S) XRAY OF THE CHEST 05/19/2024 04:18:00 PM COMPARISON: 04/30/2024 CLINICAL HISTORY: Hypoxia Hypoxia Hypoxia Hypoxia Hypoxia FINDINGS: LINES, TUBES AND DEVICES: Numerous leads and wires over chest. LUNGS AND PLEURA: Low lung volumes, accentuating pulmonary interstitium. Left lower lung opacity favored to be related to scarring. Slightly worsened bibasilar atelectasis. No pleural effusion. No pneumothorax. HEART AND MEDIASTINUM: Mild cardiomegaly. Atherosclerosis in transverse aorta. BONES AND SOFT TISSUES: No acute osseous abnormality. IMPRESSION: 1. Low lung volumes with slightly worsened bibasilar atelectasis and left lower lung opacity favored to be related to scarring. 2. Mild cardiomegaly. Electronically signed by: Norleen Boxer MD 05/19/2024 05:08 PM EST RP Workstation: [id number]    Procedures   Medications Ordered in the ED  vancomycin  (VANCOREADY) IVPB 1250 mg/250 mL (1,250 mg Intravenous New Bag/Given 05/19/24 1738)  lactated ringers  infusion (has no administration in time range)  heparin  injection 5,000 Units (has no administration in time range)  acetaminophen  (TYLENOL ) tablet 650 mg (has no administration in time range)    Or  acetaminophen  (TYLENOL ) suppository 650 mg (has no administration in time range)  ondansetron  (ZOFRAN ) tablet 4 mg (has no  administration in time range)    Or  ondansetron  (ZOFRAN ) injection 4 mg (has no administration in time range)  albuterol  (PROVENTIL ) (2.5 MG/3ML) 0.083% nebulizer solution 2.5 mg (  has no administration in time range)  piperacillin -tazobactam (ZOSYN ) IVPB 3.375 g (0 g Intravenous Stopped 05/19/24 1731)  lactated ringers  bolus 1,000 mL (0 mLs Intravenous Stopped 05/19/24 1735)                               Medical Decision Making Amount and/or Complexity of Data Reviewed Labs: ordered. Radiology: ordered.  Risk Prescription drug management. Decision regarding hospitalization.   Patient is a 64 year old female who presents today for evaluation of respiratory stress as well as hypoxia in the setting of possible aspiration event.  On initial assessment patient was noted to be hypothermic on core temperature with a rectal temp of 95.2.  Otherwise hemodynamically stable.  Patient does have increased respiratory effort here in the emergency department and notably hypoxic requiring 4 L of nasal cannula to maintain oxygen saturations.  Rhonchorous lung sounds throughout.  Baseline left hemibody weakness on neurologic exam.  Mild somnolence however responds to verbal stimuli.  On i-STAT examination patient has no evidence of significant hypercapnic respiratory failure but does have an elevated lactic acid of 2.1.  Given patient's respiratory status, elevated lactic acid and hypothermia, initiated sepsis protocol with additional blood cultures as well as vancomycin  and Zosyn .  Patient laboratory evaluation notable for mild AKI at 1.29 and otherwise unremarkable CBC, chest x-ray as well as head CT without any acute findings.  Patient remained with persistent nausea requirement here in the emergency department at 4 L.  Concerning for potential pulmonary sepsis versus hypoxic respiratory failure secondary to aspiration pneumonitis.  Patient does have multiple prior presentations for aspiration pneumonia.   Will need admission to the inpatient service at this point in time.  Final diagnoses:  Acute respiratory failure with hypoxia (HCC)  Aspiration into airway, initial encounter    ED Discharge Orders     None          Laurita Sieving, MD 05/19/24 MAURINE Later, [place] K, DO 05/19/24 2314  "

## 2024-05-19 NOTE — H&P (Signed)
 " History and Physical    [name] FMW:[id number] DOB: Jul 12, 1960 DOA: 05/19/2024  PCP: [name], DO  Patient coming from: SNF  I have personally briefly reviewed patient's old medical records in [facility]  Chief Complaint: Respiratory distress  HPI: [name] is a 64 y.o. female with medical history significant of stroke with left-sided residual weakness, hypertension, hyperlipidemia, type 2 diabetes, non-Hodgkin lymphoma brought by EMS from [facility] for respiratory distress.  Apparently her blood sugar at facility noted to be 50.  2 oral glucose packs were administered however per EMS report patient aspirated and developed respiratory distress that was treated with albuterol  at facility.  EMS was called and upon EMS arrival patient's oxygen saturation noted to be in 70s.  She was placed on nonrebreather mask and brought to the emergency department for further evaluation and management.  No fever, chills, fall, UTI symptoms per chart.  Please note: Patient recently discharged from the hospital on 05/03/2024-admitted for acute metabolic encephalopathy in the setting of complicated UTI  ED Course: Upon arrival to ED: Patient temperature noted to be 95.2.  Was placed on Bair hugger.  Pulse 76, RR: 25, BP 130/82.  On 6 L oxygen via nasal cannula.  NA: 145, K: 4.2, BUN 67, CR: 1.29, GFR 46, normal liver enzyme.  WBC: 3.3, H&H 9.9/31.0, PLT: 248.  COVID flu RSV negative.  UA pending.  Blood culture pending.  CT head negative for any acute findings.  Chest x-ray shows low lung volume with slightly worsening bibasilar atelectasis and left lower lung opacity favored to be related to scarring.  Mild cardiomegaly.  Code sepsis activated.  Patient was started on broad-spectrum.  [facility] consulted for admission  Review of Systems: As per HPI otherwise negative.    Past Medical History:  Diagnosis Date   Abnormality of gait 05/10/2010   BACK PAIN 11/14/2008    Class 1 obesity due to excess calories with body mass index (BMI) of 31.0 to 31.9 in adult 02/07/2021   DIABETES MELLITUS, TYPE II 07/15/2008   Diplopia 07/15/2008   ECZEMA, ATOPIC 04/03/2009   GERD (gastroesophageal reflux disease)    Guillain-Barre 1988   HYPERLIPIDEMIA 03/06/2009   HYPERTENSION 07/15/2008   Stroke (HCC) 2010, 2011   x2    Vertebral artery stenosis     Past Surgical History:  Procedure Laterality Date   ABDOMINAL HYSTERECTOMY     BIOPSY  07/08/2022   Procedure: BIOPSY;  Surgeon: Leigh Elspeth SQUIBB, MD;  Location: [facility] ENDOSCOPY;  Service: Gastroenterology;;   BIOPSY  10/12/2022   Procedure: BIOPSY;  Surgeon: San Sandor GAILS, DO;  Location: [facility] ENDOSCOPY;  Service: Gastroenterology;;   COLONOSCOPY WITH PROPOFOL  N/A 07/08/2022   Procedure: COLONOSCOPY WITH PROPOFOL ;  Surgeon: Leigh Elspeth SQUIBB, MD;  Location: [facility] ENDOSCOPY;  Service: Gastroenterology;  Laterality: N/A;   DILATION AND CURETTAGE OF UTERUS     FLEXIBLE SIGMOIDOSCOPY N/A 10/12/2022   Procedure: FLEXIBLE SIGMOIDOSCOPY;  Surgeon: San Sandor GAILS, DO;  Location: [facility] ENDOSCOPY;  Service: Gastroenterology;  Laterality: N/A;   FOOT SURGERY     IR ANGIO INTRA EXTRACRAN SEL COM CAROTID INNOMINATE UNI L MOD SED  09/17/2021   IR ANGIO INTRA EXTRACRAN SEL INTERNAL CAROTID UNI R MOD SED  09/17/2021   IR ANGIO VERTEBRAL SEL VERTEBRAL UNI R MOD SED  09/17/2021   IR US  GUIDE VASC ACCESS RIGHT  09/17/2021     reports that she has never smoked. She has never used smokeless tobacco. She reports that  she does not drink alcohol and does not use drugs.  Allergies[1]  Family History  Problem Relation Age of Onset   Diabetes Sister    Asthma Other    Stroke Other    Hypertension Other    Diabetes Mother    Stroke Mother    Cancer Father        pt states hae had some kind of stomach cancer, ? stomach or colon   Breast cancer Neg Hx     Prior to Admission medications  Medication Sig Start Date End Date Taking?  Authorizing Provider  acetaminophen  (TYLENOL ) 325 MG tablet Take 650 mg by mouth every 4 (four) hours as needed for mild pain (pain score 1-3) or fever. Do not exceed 3000mg  in 24 hours    [provider]  amLODipine  (NORVASC ) 10 MG tablet Take 1 tablet by mouth once daily 05/09/22   Jordan, [name], MD  ammonium lactate (LAC-HYDRIN) 12 % lotion Apply 1 Application topically daily. Apply to bilateral feet and lower legs topically every day shift for dry skin    [provider]  aspirin  EC 81 MG tablet Take 1 tablet (81 mg total) by mouth daily. Swallow whole. 06/18/22   Krishnan, Gokul, MD  atorvastatin  (LIPITOR ) 80 MG tablet Take 1 tablet (80 mg total) by mouth daily. 05/09/22   Jordan, [name], MD  baclofen  5 MG TABS Take 4 tablets (20 mg total) by mouth 3 (three) times daily as needed for muscle spasms. Patient taking differently: Take 20 mg by mouth 3 (three) times daily. 03/06/24   Singh, Prashant K, MD  Benzocaine-Menthol  (ORAJEL 2X TOOTHACHE & GUM) 20-0.26 % GEL Use as directed 1 Application in the mouth or throat every 4 (four) hours as needed.    [provider]  busPIRone  (BUSPAR ) 10 MG tablet Take 10 mg by mouth 2 (two) times daily. 02/29/24   [provider]  carvedilol  (COREG ) 12.5 MG tablet Take 1 tablet (12.5 mg total) by mouth 2 (two) times daily with a meal. 05/03/24   Ghimire, Donalda HERO, MD  cloNIDine  (CATAPRES  - DOSED IN MG/24 HR) 0.1 mg/24hr patch Place 0.1 mg onto the skin every Saturday. 08/28/23   [provider]  Cranberry (SM CRANBERRY) 300 MG tablet Take 300 mg by mouth 2 (two) times daily.    [provider]  ezetimibe  (ZETIA ) 10 MG tablet Take 1 tablet (10 mg total) by mouth daily. 05/09/22   Jordan, [name], MD  ferrous sulfate 325 (65 FE) MG tablet Take 325 mg by mouth daily at 12 noon.    [provider]  FLUoxetine  (PROZAC ) 40 MG capsule Take 40 mg by mouth daily. 04/25/23   [provider]  gabapentin   (NEURONTIN ) 100 MG capsule Take 2 capsules (200 mg total) by mouth 3 (three) times daily. 05/03/24   Ghimire, Donalda HERO, MD  guaiFENesin  (ROBITUSSIN) 100 MG/5ML liquid Take 10 mLs by mouth every 4 (four) hours as needed for cough or to loosen phlegm.    [provider]  hydrochlorothiazide  (HYDRODIURIL ) 12.5 MG tablet Take 1 tablet (12.5 mg total) by mouth daily. 03/09/24   Singh, Prashant K, MD  insulin  aspart (NOVOLOG ) 100 UNIT/ML injection Inject 0-10 Units into the skin 3 (three) times daily before meals. Before each meal 3 times a day, 140-199 - 4 units, 200-250 - 6 units, 251-299 - 8 units,  300-349 - 10 units,  350 or above 12 units. Patient taking differently: Inject 0-12 Units into  the skin 3 (three) times daily before meals. Before each meal 3 times a day, 140-199 - 4 units, 200-250 - 6 units, 251-299 - 8 units,  300-349 - 10 units,  350 or above 12 units. 03/06/24   Singh, Prashant K, MD  Lidocaine  HCl (ASPERCREME LIDOCAINE ) 4 % CREA Apply 1 Application topically daily as needed (bilateral feet pain).    [provider]  melatonin 5 MG TABS Take 5 mg by mouth at bedtime as needed (insomnia).    [provider]  metFORMIN  (GLUCOPHAGE ) 500 MG tablet Take 500 mg by mouth 2 (two) times daily with a meal.    [provider]  oxyCODONE  (OXY IR/ROXICODONE ) 5 MG immediate release tablet Take 1 tablet (5 mg total) by mouth every 6 (six) hours as needed for moderate pain (pain score 4-6). 05/03/24   Ghimire, Donalda HERO, MD  pantoprazole  (PROTONIX ) 40 MG tablet Take 1 tablet (40 mg total) by mouth daily. 10/17/22 11/29/24  Vernon Ranks, MD  senna-docusate (SENOKOT-S) 8.6-50 MG tablet Take 1 tablet by mouth daily as needed for mild constipation.    [provider]  venetoclax  (VENCLEXTA ) 100 MG tablet Take 2 tablets (200 mg total) by mouth daily. Tablets should be swallowed whole with a meal and a full glass of water. 04/23/24 05/27/24  Onesimo Emaline Brink, MD     Physical Exam: Vitals:   05/19/24 1621 05/19/24 1700 05/19/24 1730 05/19/24 1800  BP: 111/75 125/87 105/74 134/89  Pulse: 70 73 66 73  Resp: 17 15 20 14   Temp: (!) 95.2 F (35.1 C)     TempSrc: Rectal     SpO2: 94% 98% 97% 95%  Weight:      Height:        Constitutional: NAD, calm, comfortable, on nasal cannula, sister at the bedside Eyes: PERRL, lids and conjunctivae normal ENMT: Mucous membranes are moist. Posterior pharynx clear of any exudate or lesions.Normal dentition.  Neck: normal, supple, no masses, no thyromegaly Respiratory: Bilateral rhonchi noted on the bases Cardiovascular: Regular rate and rhythm, no murmurs / rubs / gallops. No extremity edema. 2+ pedal pulses. No carotid bruits.  Abdomen: no tenderness, no masses palpated. No hepatosplenomegaly. Bowel sounds positive.   Skin: no rashes, lesions, ulcers. No induration Neurologic: Alert, oriented to person only, able to recognize her sister at the bedside.  Left-sided residual hemiparesis noted Labs on Admission: I have personally reviewed following labs and imaging studies  CBC: Recent Labs  Lab 05/19/24 1603 05/19/24 1604 05/19/24 1609  WBC  --  3.3*  --   NEUTROABS  --  2.3  --   HGB 9.9* 9.9* 10.2*  HCT 29.0* 31.0* 30.0*  MCV  --  86.6  --   PLT  --  248  --    Basic Metabolic Panel: Recent Labs  Lab 05/19/24 1603 05/19/24 1604 05/19/24 1609  NA 145 145 145  K 4.1 4.2 4.1  CL 107 107  --   CO2  --  26  --   GLUCOSE 77 82  --   BUN 62* 67*  --   CREATININE 1.60* 1.29*  --   CALCIUM   --  10.3  --    GFR: Estimated Creatinine Clearance: 41.5 mL/min (A) (by C-G formula based on SCr of 1.29 mg/dL (H)). Liver Function Tests: Recent Labs  Lab 05/19/24 1604  AST 23  ALT 22  ALKPHOS 72  BILITOT 0.3  PROT 7.2  ALBUMIN 3.6   No results for  input(s): LIPASE, AMYLASE in the last 168 hours. No results for input(s): AMMONIA in the last 168 hours. Coagulation Profile: No results for  input(s): INR, PROTIME in the last 168 hours. Cardiac Enzymes: No results for input(s): CKTOTAL, CKMB, CKMBINDEX, TROPONINI in the last 168 hours. BNP (last 3 results) Recent Labs    04/30/24 0946  PROBNP 1,432.0*   HbA1C: No results for input(s): HGBA1C in the last 72 hours. CBG: Recent Labs  Lab 05/19/24 1558  GLUCAP 75   Lipid Profile: No results for input(s): CHOL, HDL, LDLCALC, TRIG, CHOLHDL, LDLDIRECT in the last 72 hours. Thyroid  Function Tests: No results for input(s): TSH, T4TOTAL, FREET4, T3FREE, THYROIDAB in the last 72 hours. Anemia Panel: No results for input(s): VITAMINB12, FOLATE, FERRITIN, TIBC, IRON, RETICCTPCT in the last 72 hours. Urine analysis:    Component Value Date/Time   COLORURINE YELLOW 04/30/2024 0112   APPEARANCEUR CLOUDY (A) 04/30/2024 0112   LABSPEC 1.009 04/30/2024 0112   PHURINE 7.0 04/30/2024 0112   GLUCOSEU NEGATIVE 04/30/2024 0112   HGBUR SMALL (A) 04/30/2024 0112   HGBUR negative 03/12/2010 0847   BILIRUBINUR NEGATIVE 04/30/2024 0112   BILIRUBINUR n 09/11/2015 1039   KETONESUR NEGATIVE 04/30/2024 0112   PROTEINUR >=300 (A) 04/30/2024 0112   UROBILINOGEN 1.0 09/11/2015 1039   UROBILINOGEN 1.0 05/19/2014 1858   NITRITE NEGATIVE 04/30/2024 0112   LEUKOCYTESUR SMALL (A) 04/30/2024 0112    Radiological Exams on Admission: CT Head Wo Contrast Result Date: 05/19/2024 EXAM: CT HEAD WITHOUT CONTRAST 05/19/2024 04:56:11 PM TECHNIQUE: CT of the head was performed without the administration of intravenous contrast. Automated exposure control, iterative reconstruction, and/or weight based adjustment of the mA/kV was utilized to reduce the radiation dose to as low as reasonably achievable. COMPARISON: CT head without contrast 04/29/2024 and MR head without contrast 04/30/2024. CLINICAL HISTORY: Mental status change, unknown cause. FINDINGS: BRAIN AND VENTRICLES: No acute hemorrhage. No evidence of  acute infarct. 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 No hydrocephalus. No extra-axial collection. No mass effect or midline shift. Atherosclerotic calcifications within the cavernous internal carotid arteries. ORBITS: No acute abnormality. SINUSES: No acute abnormality. SOFT TISSUES AND SKULL: No acute soft tissue abnormality. No skull fracture. IMPRESSION: 1. No acute intracranial abnormality. 2. Stable remote lacunar infarcts of the cerebellum. Electronically signed by: Lonni Necessary MD 05/19/2024 05:21 PM EST RP Workstation: [id number]   DG Chest Portable 1 View Result Date: 05/19/2024 EXAM: 1 VIEW(S) XRAY OF THE CHEST 05/19/2024 04:18:00 PM COMPARISON: 04/30/2024 CLINICAL HISTORY: Hypoxia Hypoxia Hypoxia Hypoxia Hypoxia FINDINGS: LINES, TUBES AND DEVICES: Numerous leads and wires over chest. LUNGS AND PLEURA: Low lung volumes, accentuating pulmonary interstitium. Left lower lung opacity favored to be related to scarring. Slightly worsened bibasilar atelectasis. No pleural effusion. No pneumothorax. HEART AND MEDIASTINUM: Mild cardiomegaly. Atherosclerosis in transverse aorta. BONES AND SOFT TISSUES: No acute osseous abnormality. IMPRESSION: 1. Low lung volumes with slightly worsened bibasilar atelectasis and left lower lung opacity favored to be related to scarring. 2. Mild cardiomegaly. Electronically signed by: Norleen Boxer MD 05/19/2024 05:08 PM EST RP Workstation: [id number]    EKG: Independently reviewed.  Sinus rhythm.  No acute ST-T wave changes noted  Assessment/Plan  Severe sepsis in the setting of aspiration pneumonia: Acute hypoxemic respiratory failure - Patient met septic criteria upon arrival with hypothymia, tachypnea, lactic acidosis and leukopenia - Currently on 6 L oxygen via nasal cannula.  On Lawyer.  COVID flu RSV negative.  Blood culture obtained and is pending.  Reviewed chest  x-ray. - Will admit patient at progressive unit.  On continuous pulse  ox. - Received Zosyn  and vancomycin  in ED.  Will start Unasyn .  Will try to wean off of oxygen as tolerated.  Will trend lactic acid. - Monitor vitals closely  Altered mental status: - Likely in the setting of hypoglycemia - CT head negative. - Consult PT/SLP.  Will keep her n.p.o. until she passes bedside swallow evaluation  AKI: - Start on IV hydration.  Monitor renal function closely.  Avoid nephrotoxic medication  Type 2 diabetes: Last A1c 7.6.  Checked on 03/2024 - Home medications including metformin  and NovoLog .  Will add sliding scale insulin .  Monitor blood sugar closely.  Hypertension: - Blood pressure is stable. - Will hold antihypertensive medicine at this time since patient is confused  History of CVA with left-sided hemiparesis - At baseline.  CT head is negative - On aspirin  and statin and Zetia   Mood disorder: - On Prozac  and BuSpar   History of small lymphocytic lymphoma: Chronic anemia - Followed by oncology outpatient  GERD: On PPI  Neuropathy: On gabapentin   DVT prophylaxis: Heparin /SCD Code Status: Full code-confirmed with patient's sister Family Communication: Sister present at bedside.  Plan of care discussed with patient in length and she verbalized understanding and agreed with it. Disposition Plan: SNF Consults called: None Admission status: inPatient   Velna JONELLE Skeeter MD [facility]  If 7PM-7AM, please contact night-coverage [email or web address]  05/19/2024, 6:22 PM        [1] No Known Allergies  "

## 2024-05-19 NOTE — ED Notes (Signed)
 PT to CT

## 2024-05-19 NOTE — ED Notes (Signed)
 Called CCMD at [phone] to initiate cardiac monitoring as ordered.

## 2024-05-20 ENCOUNTER — Encounter (HOSPITAL_COMMUNITY): Payer: Self-pay | Admitting: Internal Medicine

## 2024-05-20 ENCOUNTER — Other Ambulatory Visit: Payer: Self-pay

## 2024-05-20 DIAGNOSIS — R6520 Severe sepsis without septic shock: Secondary | ICD-10-CM | POA: Diagnosis not present

## 2024-05-20 DIAGNOSIS — A419 Sepsis, unspecified organism: Secondary | ICD-10-CM | POA: Diagnosis not present

## 2024-05-20 DIAGNOSIS — N179 Acute kidney failure, unspecified: Secondary | ICD-10-CM | POA: Diagnosis not present

## 2024-05-20 LAB — URINALYSIS, ROUTINE W REFLEX MICROSCOPIC
Bacteria, UA: NONE SEEN
Bilirubin Urine: NEGATIVE
Glucose, UA: NEGATIVE mg/dL
Ketones, ur: 5 mg/dL — AB
Leukocytes,Ua: NEGATIVE
Nitrite: NEGATIVE
Protein, ur: 100 mg/dL — AB
Specific Gravity, Urine: 1.009 (ref 1.005–1.030)
pH: 7 (ref 5.0–8.0)

## 2024-05-20 LAB — COMPREHENSIVE METABOLIC PANEL WITH GFR
ALT: 18 U/L (ref 0–44)
AST: 19 U/L (ref 15–41)
Albumin: 3.3 g/dL — ABNORMAL LOW (ref 3.5–5.0)
Alkaline Phosphatase: 67 U/L (ref 38–126)
Anion gap: 14 (ref 5–15)
BUN: 66 mg/dL — ABNORMAL HIGH (ref 8–23)
CO2: 25 mmol/L (ref 22–32)
Calcium: 10 mg/dL (ref 8.9–10.3)
Chloride: 105 mmol/L (ref 98–111)
Creatinine, Ser: 1.31 mg/dL — ABNORMAL HIGH (ref 0.44–1.00)
GFR, Estimated: 45 mL/min — ABNORMAL LOW
Glucose, Bld: 148 mg/dL — ABNORMAL HIGH (ref 70–99)
Potassium: 4.4 mmol/L (ref 3.5–5.1)
Sodium: 143 mmol/L (ref 135–145)
Total Bilirubin: 0.3 mg/dL (ref 0.0–1.2)
Total Protein: 6.7 g/dL (ref 6.5–8.1)

## 2024-05-20 LAB — CBC
HCT: 28.6 % — ABNORMAL LOW (ref 36.0–46.0)
Hemoglobin: 9.2 g/dL — ABNORMAL LOW (ref 12.0–15.0)
MCH: 27.5 pg (ref 26.0–34.0)
MCHC: 32.2 g/dL (ref 30.0–36.0)
MCV: 85.6 fL (ref 80.0–100.0)
Platelets: 206 K/uL (ref 150–400)
RBC: 3.34 MIL/uL — ABNORMAL LOW (ref 3.87–5.11)
RDW: 16.9 % — ABNORMAL HIGH (ref 11.5–15.5)
WBC: 5.7 K/uL (ref 4.0–10.5)
nRBC: 0 % (ref 0.0–0.2)

## 2024-05-20 LAB — GLUCOSE, CAPILLARY
Glucose-Capillary: 143 mg/dL — ABNORMAL HIGH (ref 70–99)
Glucose-Capillary: 150 mg/dL — ABNORMAL HIGH (ref 70–99)

## 2024-05-20 LAB — CBG MONITORING, ED
Glucose-Capillary: 137 mg/dL — ABNORMAL HIGH (ref 70–99)
Glucose-Capillary: 119 mg/dL — ABNORMAL HIGH (ref 70–99)

## 2024-05-20 MED ORDER — OXYCODONE HCL 5 MG PO TABS
5.0000 mg | ORAL_TABLET | Freq: Four times a day (QID) | ORAL | Status: DC | PRN
  Administered 2024-05-21 – 2024-05-22 (×3): 5 mg via ORAL
  Filled 2024-05-20 (×3): qty 1

## 2024-05-20 MED ORDER — ALBUTEROL SULFATE (2.5 MG/3ML) 0.083% IN NEBU: 2.5000 mg | INHALATION_SOLUTION | RESPIRATORY_TRACT | Status: DC | PRN

## 2024-05-20 MED ORDER — AMLODIPINE BESYLATE 10 MG PO TABS
10.0000 mg | ORAL_TABLET | Freq: Every day | ORAL | Status: DC
  Administered 2024-05-20 – 2024-05-24 (×5): 10 mg via ORAL
  Filled 2024-05-20 (×5): qty 1

## 2024-05-20 MED ORDER — LACTATED RINGERS IV SOLN: INTRAVENOUS | Status: DC

## 2024-05-20 MED ORDER — CLONIDINE HCL 0.1 MG/24HR TD PTWK: 0.1000 mg | MEDICATED_PATCH | TRANSDERMAL | Status: DC

## 2024-05-20 MED ORDER — CARVEDILOL 12.5 MG PO TABS
12.5000 mg | ORAL_TABLET | Freq: Two times a day (BID) | ORAL | Status: DC
  Administered 2024-05-20 – 2024-05-24 (×8): 12.5 mg via ORAL
  Filled 2024-05-20 (×8): qty 1

## 2024-05-20 NOTE — Progress Notes (Signed)
 " PROGRESS NOTE  [name] FMW:[id number] DOB: [birth year] DOA: 05/19/2024 PCP: [name], DO   LOS: 1 day   Brief narrative:   [name] is a 64 y.o. female with medical history significant of stroke with left-sided residual weakness, hypertension, hyperlipidemia, type 2 diabetes, non-Hodgkin lymphoma brought by EMS from [facility] presented to the  hospital with respiratory distress.  At her nursing facility she was also noted to have a blood glucose level of 50 and was given oral glucose packs.  Subsequently patient aspirated and started having respiratory distress so was given albuterol  nebulizer.  In the ED patient was hypoxic in the 70s.  She was put on nonrebreather mask and was brought into the hospital for further evaluation and treatment.  In the ED patient was noted to be having temperature of 95.2 and was put on [employer]  Was tachycardic.  Was on 6 L nasal cannula oxygen.  Initial labs were notable for creatinine of 1.2 with WBC at 3.3.  COVID flu and RSV was negative.  CT head was negative for acute findings.  Chest x-ray showed low lung volumes with worsening bibasilar atelectasis.  Code sepsis was activated patient was given broad-spectrum antibiotic and was admitted to the hospital for further evaluation and treatment.    Assessment/Plan: Principal Problem:   Sepsis (HCC)   Severe sepsis in the setting of aspiration pneumonia: Patient met sepsis criteria with hypothermia, tachypnea, lactic acidosis and leukopenia.  Currently on 3 l oxygen by nasal cannula and continues to have weight cough..  Initially when needed.  Out of hypokalemia COVID flu and RSV was negative.  Blood culture was obtained and negative in less than 24 hours.  No leukocytosis.  Temperature max of 99.9 F.  Continue Unasyn .  Wean oxygen as able.  Lactate of 1.2.  Continue aspiration precautions.  Check speech therapy evaluation.  Currently n.p.o.  Altered mental status likely metabolic  encephalopathy Secondary to hypoglycemia.  CT head scan was negative.  Continue PT, speech therapy.  Acute kidney injury. Continue IV hydration.  Creatinine of 1.3.  Continue IVF since patient is NPO.  Check BMP in AM.   Type 2 diabetes:  Last A1c 7.6.  Checked on 03/2024.  Continue medications including metformin  and NovoLog .  Add sliding scale insulin .  POC glucose of 147.  Essential hypertension: Hold antihypertensive medications.   History of CVA with left-sided hemiparesis CT head scan negative.  Continue aspirin  statin and Zetia .  Difficult to understand speech.  Mood disorder: - On Prozac  and BuSpar    History of small lymphocytic lymphoma: Chronic anemia Followed by oncology as outpatient.   GERD: On PPI   Neuropathy: On gabapentin    DVT prophylaxis: heparin  injection 5,000 Units Start: 05/19/24 2200 SCDs Start: 05/19/24 1820   Disposition: Skilled nursing facility.  PT recommended no therapy since patient is dependent on transfers with [facility] lift and does not propel wheelchair by herself. Appear appropriate for PT  Status is: Inpatient Remains inpatient appropriate because: Need for IV antibiotic, pending clinical improvement    Code Status:     Code Status: Full Code  Family Communication: None at bedside  Consultants: None  Procedures: None  Anti-infectives:  Unasyn   Anti-infectives (From admission, onward)    Start     Dose/Rate Route Frequency Ordered Stop   05/19/24 2300  Ampicillin -Sulbactam (UNASYN ) 3 g in sodium chloride  0.9 % 100 mL IVPB        3 g 200 mL/hr over 30 Minutes Intravenous  Every 6 hours 05/19/24 1843     05/19/24 1645  piperacillin -tazobactam (ZOSYN ) IVPB 3.375 g        3.375 g 100 mL/hr over 30 Minutes Intravenous  Once 05/19/24 1642 05/19/24 1731   05/19/24 1630  vancomycin  (VANCOREADY) IVPB 1250 mg/250 mL        1,250 mg 166.7 mL/hr over 90 Minutes Intravenous  Once 05/19/24 1617 05/19/24 1908   05/19/24 1630   piperacillin -tazobactam (ZOSYN ) IVPB 4.5 g  Status:  Discontinued        4.5 g 200 mL/hr over 30 Minutes Intravenous  Once 05/19/24 1617 05/19/24 1642        Subjective: Today, patient was seen and examined at bedside.  Incomprehensible speech and episode of family at times.  Appears to have moist cough.  Objective: Vitals:   05/20/24 0900 05/20/24 0921  BP: (!) 163/77   Pulse: 78   Resp: 17   Temp:  98.2 F (36.8 C)  SpO2: 97%     Intake/Output Summary (Last 24 hours) at 05/20/2024 1038 Last data filed at 05/19/2024 2243 Gross per 24 hour  Intake 350 ml  Output --  Net 350 ml   Filed Weights   05/19/24 1558  Weight: 69 kg   Body mass index is 26.11 kg/m.   Physical Exam: GENERAL: Patient is alert awake but in good complains, follows commands, appears weak and deconditioned HENT: No scleral pallor or icterus. Pupils equally reactive to light. Oral mucosa is moist NECK: is supple, no gross swelling noted. CHEST: Coarse breath sounds noted bilaterally, rhonchi noted. CVS: S1 and S2 heard, no murmur. Regular rate and rhythm.  ABDOMEN: Soft, non-tender, bowel sounds are present. EXTREMITIES: No edema. CNS: Mildly somnolent, incomprehensible speech, oriented to self, left-sided residual weakness, follows commands, SKIN: warm and dry without rashes.  Data Review: I have personally reviewed the following laboratory data and studies,  CBC: Recent Labs  Lab 05/19/24 1603 05/19/24 1604 05/19/24 1609 05/19/24 1914 05/20/24 0312  WBC  --  3.3*  --  5.8 5.7  NEUTROABS  --  2.3  --   --   --   HGB 9.9* 9.9* 10.2* 9.0* 9.2*  HCT 29.0* 31.0* 30.0* 28.4* 28.6*  MCV  --  86.6  --  86.1 85.6  PLT  --  248  --  208 206   Basic Metabolic Panel: Recent Labs  Lab 05/19/24 1603 05/19/24 1604 05/19/24 1609 05/19/24 1914 05/20/24 0312  NA 145 145 145  --  143  K 4.1 4.2 4.1  --  4.4  CL 107 107  --   --  105  CO2  --  26  --   --  25  GLUCOSE 77 82  --   --  148*  BUN  62* 67*  --   --  66*  CREATININE 1.60* 1.29*  --  1.24* 1.31*  CALCIUM   --  10.3  --   --  10.0   Liver Function Tests: Recent Labs  Lab 05/19/24 1604 05/20/24 0312  AST 23 19  ALT 22 18  ALKPHOS 72 67  BILITOT 0.3 0.3  PROT 7.2 6.7  ALBUMIN 3.6 3.3*   No results for input(s): LIPASE, AMYLASE in the last 168 hours. No results for input(s): AMMONIA in the last 168 hours. Cardiac Enzymes: No results for input(s): CKTOTAL, CKMB, CKMBINDEX, TROPONINI in the last 168 hours. BNP (last 3 results) No results for input(s): BNP in the last 8760 hours.  ProBNP (  last 3 results) Recent Labs    04/30/24 0946  PROBNP 1,432.0*    CBG: Recent Labs  Lab 05/19/24 1558 05/19/24 1826 05/19/24 2203 05/20/24 0707  GLUCAP 75 132* 172* 137*   Recent Results (from the past 240 hours)  Resp panel by RT-PCR (RSV, Flu A&B, Covid) Anterior Nasal Swab     Status: None   Collection Time: 05/19/24  4:05 PM   Specimen: Anterior Nasal Swab  Result Value Ref Range Status   SARS Coronavirus 2 by RT PCR NEGATIVE NEGATIVE Final   Influenza A by PCR NEGATIVE NEGATIVE Final   Influenza B by PCR NEGATIVE NEGATIVE Final    Comment: (NOTE) The Xpert Xpress SARS-CoV-2/FLU/RSV plus assay is intended as an aid in the diagnosis of influenza from Nasopharyngeal swab specimens and should not be used as a sole basis for treatment. Nasal washings and aspirates are unacceptable for Xpert Xpress SARS-CoV-2/FLU/RSV testing.  Fact Sheet for Patients: bloggercourse.com  Fact Sheet for Healthcare Providers: seriousbroker.it  This test is not yet approved or cleared by the [place]  FDA and has been authorized for detection and/or diagnosis of SARS-CoV-2 by FDA under an Emergency Use Authorization (EUA). This EUA will remain in effect (meaning this test can be used) for the duration of the COVID-19 declaration under Section 564(b)(1) of the  Act, 21 U.S.C. section 360bbb-3(b)(1), unless the authorization is terminated or revoked.     Resp Syncytial Virus by PCR NEGATIVE NEGATIVE Final    Comment: (NOTE) Fact Sheet for Patients: bloggercourse.com  Fact Sheet for Healthcare Providers: seriousbroker.it  This test is not yet approved or cleared by the [place]  FDA and has been authorized for detection and/or diagnosis of SARS-CoV-2 by FDA under an Emergency Use Authorization (EUA). This EUA will remain in effect (meaning this test can be used) for the duration of the COVID-19 declaration under Section 564(b)(1) of the Act, 21 U.S.C. section 360bbb-3(b)(1), unless the authorization is terminated or revoked.  Performed at [facility], [address], [place], [place] [address]   Blood culture (routine x 2)     Status: None (Preliminary result)   Collection Time: 05/19/24  4:18 PM   Specimen: BLOOD RIGHT HAND  Result Value Ref Range Status   Specimen Description BLOOD RIGHT HAND  Final   Special Requests   Final    BOTTLES DRAWN AEROBIC AND ANAEROBIC Blood Culture results may not be optimal due to an inadequate volume of blood received in culture bottles   Culture   Final    NO GROWTH < 24 HOURS Performed at [facility], [address] [place], [place] [address]    Report Status PENDING  Incomplete  Blood culture (routine x 2)     Status: None (Preliminary result)   Collection Time: 05/19/24  4:23 PM   Specimen: BLOOD  Result Value Ref Range Status   Specimen Description BLOOD LEFT ANTECUBITAL  Final   Special Requests   Final    BOTTLES DRAWN AEROBIC AND ANAEROBIC Blood Culture adequate volume   Culture   Final    NO GROWTH < 24 HOURS Performed at [facility], [address] [place], [place] [address]    Report Status PENDING  Incomplete     Studies: CT Head Wo Contrast Result Date: 05/19/2024 EXAM: CT HEAD WITHOUT CONTRAST  05/19/2024 04:56:11 PM TECHNIQUE: CT of the head was performed without the administration of intravenous contrast. Automated exposure control, iterative reconstruction, and/or weight based  adjustment of the mA/kV was utilized to reduce the radiation dose to as low as reasonably achievable. COMPARISON: CT head without contrast 04/29/2024 and MR head without contrast 04/30/2024. CLINICAL HISTORY: Mental status change, unknown cause. FINDINGS: BRAIN AND VENTRICLES: No acute hemorrhage. No evidence of acute infarct. 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 No hydrocephalus. No extra-axial collection. No mass effect or midline shift. Atherosclerotic calcifications within the cavernous internal carotid arteries. ORBITS: No acute abnormality. SINUSES: No acute abnormality. SOFT TISSUES AND SKULL: No acute soft tissue abnormality. No skull fracture. IMPRESSION: 1. No acute intracranial abnormality. 2. Stable remote lacunar infarcts of the cerebellum. Electronically signed by: Lonni Necessary MD 05/19/2024 05:21 PM EST RP Workstation: [id number]   DG Chest Portable 1 View Result Date: 05/19/2024 EXAM: 1 VIEW(S) XRAY OF THE CHEST 05/19/2024 04:18:00 PM COMPARISON: 04/30/2024 CLINICAL HISTORY: Hypoxia Hypoxia Hypoxia Hypoxia Hypoxia FINDINGS: LINES, TUBES AND DEVICES: Numerous leads and wires over chest. LUNGS AND PLEURA: Low lung volumes, accentuating pulmonary interstitium. Left lower lung opacity favored to be related to scarring. Slightly worsened bibasilar atelectasis. No pleural effusion. No pneumothorax. HEART AND MEDIASTINUM: Mild cardiomegaly. Atherosclerosis in transverse aorta. BONES AND SOFT TISSUES: No acute osseous abnormality. IMPRESSION: 1. Low lung volumes with slightly worsened bibasilar atelectasis and left lower lung opacity favored to be related to scarring. 2. Mild cardiomegaly. Electronically signed by: Norleen Boxer MD 05/19/2024 05:08 PM EST RP Workstation: [id number]       Vernal Alstrom, MD  [facility] 05/20/2024  If 7PM-7AM, please contact night-coverage             "

## 2024-05-20 NOTE — Evaluation (Signed)
 Clinical/Bedside Swallow Evaluation Patient Details  Name: [name] MRN: [id number] Date of Birth: January 17, 1961  Today's Date: 05/20/2024 Time: SLP Start Time (ACUTE ONLY): 9180 SLP Stop Time (ACUTE ONLY): 0839 SLP Time Calculation (min) (ACUTE ONLY): 20 min  Past Medical History:  Past Medical History:  Diagnosis Date   Abnormality of gait 05/10/2010   BACK PAIN 11/14/2008   Class 1 obesity due to excess calories with body mass index (BMI) of 31.0 to 31.9 in adult 02/07/2021   DIABETES MELLITUS, TYPE II 07/15/2008   Diplopia 07/15/2008   ECZEMA, ATOPIC 04/03/2009   GERD (gastroesophageal reflux disease)    Guillain-Barre 1988   HYPERLIPIDEMIA 03/06/2009   HYPERTENSION 07/15/2008   Stroke (HCC) 2010, 2011   x2    Vertebral artery stenosis    Past Surgical History:  Past Surgical History:  Procedure Laterality Date   ABDOMINAL HYSTERECTOMY     BIOPSY  07/08/2022   Procedure: BIOPSY;  Surgeon: Hanley Rispoli Elspeth SQUIBB, MD;  Location: [facility] ENDOSCOPY;  Service: Gastroenterology;;   BIOPSY  10/12/2022   Procedure: BIOPSY;  Surgeon: San Sandor GAILS, DO;  Location: [facility] ENDOSCOPY;  Service: Gastroenterology;;   COLONOSCOPY WITH PROPOFOL  N/A 07/08/2022   Procedure: COLONOSCOPY WITH PROPOFOL ;  Surgeon: Shylo Zamor Elspeth SQUIBB, MD;  Location: [facility] ENDOSCOPY;  Service: Gastroenterology;  Laterality: N/A;   DILATION AND CURETTAGE OF UTERUS     FLEXIBLE SIGMOIDOSCOPY N/A 10/12/2022   Procedure: FLEXIBLE SIGMOIDOSCOPY;  Surgeon: San Sandor GAILS, DO;  Location: [facility] ENDOSCOPY;  Service: Gastroenterology;  Laterality: N/A;   FOOT SURGERY     IR ANGIO INTRA EXTRACRAN SEL COM CAROTID INNOMINATE UNI L MOD SED  09/17/2021   IR ANGIO INTRA EXTRACRAN SEL INTERNAL CAROTID UNI R MOD SED  09/17/2021   IR ANGIO VERTEBRAL SEL VERTEBRAL UNI R MOD SED  09/17/2021   IR US  GUIDE VASC ACCESS RIGHT  09/17/2021   HPI:  [name] is a 64 y.o. female who presented 1/18 by EMS from [facility] for respiratory  distress.  Apparently her blood sugar at facility noted to be 50.  2 oral glucose packs were administered however per EMS report patient aspirated and developed respiratory distress that was treated with albuterol  at facility.  EMS was called and upon EMS arrival patient's oxygen saturation noted to be in 70s.  She was placed on nonrebreather mask and brought to the emergency department for further evaluation and management. Pt known to this service from prior admission. Most recent MBS 10/14/22 with recommendations for mechanical soft diet with thin with use of chin tuck. Pt with medical history significant of stroke with left-sided residual weakness, hypertension, hyperlipidemia, type 2 diabetes, non-Hodgkin lymphoma.    Assessment / Plan / Recommendation  Clinical Impression  Pt present with clinical indicators of pharyngeal dysphagia.  Oral cavity dry on SLP arrival. Pt with poor respiratory status, breathing through mouth with weezing and coughing observed. There appeared to be a partially dissolved tabled adhering to hard palate wedged against palatal torus.  SLP provided oral care prior to administration of PO trials. Pt was very cooperative and participative with oral care.  During oral care increased coughing noted, with deep cough and perception of difficult to clear secretions.  Pt allowed yankauer to be introduced over tongue base into oropharynx with improvement in vocal quality.  With trials of ice chip there was prolonged ineffective oral phase with need for breaths prior to swallow, but no anterior loss of chip though it was visualized sliding to anterior oral  cavity on dorsal surface of tongue.  Pt did swallow ice chip after delay.  With trials of thin liquid there was anterior loss with spoon presentation.  Pt took straw sip x2 with better oral control.  Throat clearing noted following sips by straw.  It is very difficult to determine if this is in response to POs or a result of her current  respiratory status.  With trial of puree there was good oral clearance with throat clearing again noted.  Per paperwork from [facility], pt currently consumes a regular texture diet with thin liquids.  It is unclear if she still requires use of chin tuck as documented on MBSS in June of 2024.  Give clinical presentation and current respiratory status, cannot make a safe diet recommendation at this time.  Pt does not appear ready for instrumental assessment.    Recommend pt remain NPO at present. SLP to follow for PO readiness.  Priority oral meds could be given crushed in puree if absolutely necessary.  RN report no oral meds ordered at this time.  Pt may have ice chips in moderation, after good oral care, when awake/alert, with upright positioning and 1:1 assistance.  The purpose of ice for this pt is not only comfort, but to keep pharynx healthy and moist. Pooled secretions in an NPO pt can thicken and create harmful persistent secretions that pt can't mobilize. These can even form mucus plugs. A few ice chips, given after oral care, can loosen secretions for pt to clear and maintain airway hygiene. Also can preserve some swallow function. Minimal ice is not expected to be harmful to patient even if some moisture is aspirated. Cued coughs after trials also will help airway protection. Encourage RN to continue these. Posted a sign for guidance.  SLP Visit Diagnosis: Dysphagia, oropharyngeal phase (R13.12)    Aspiration Risk  Moderate aspiration risk    Diet Recommendation NPO    Medication Administration: Via alternative means    Other Recommendations Oral Care Recommendations: Oral care QID;Oral care prior to ice chip/H20     Swallow Evaluation Recommendations  See above   Assistance Recommended at Discharge  N/A  Functional Status Assessment Patient has had a recent decline in their functional status and demonstrates the ability to make significant improvements in function in a reasonable and  predictable amount of time.  Frequency and Duration min 2x/week  2 weeks       Prognosis Prognosis for improved oropharyngeal function: Good      Swallow Study   General Date of Onset: 05/19/24 HPI: [name] is a 64 y.o. female who presented 1/18 by EMS from [facility] for respiratory distress.  Apparently her blood sugar at facility noted to be 50.  2 oral glucose packs were administered however per EMS report patient aspirated and developed respiratory distress that was treated with albuterol  at facility.  EMS was called and upon EMS arrival patient's oxygen saturation noted to be in 70s.  She was placed on nonrebreather mask and brought to the emergency department for further evaluation and management. Pt known to this service from prior admission. Most recent MBS 10/14/22 with recommendations for mechanical soft diet with thin with use of chin tuck. Pt with medical history significant of stroke with left-sided residual weakness, hypertension, hyperlipidemia, type 2 diabetes, non-Hodgkin lymphoma. Type of Study: Bedside Swallow Evaluation Previous Swallow Assessment: MBS 10/14/22 D3/thin with chin tuck Diet Prior to this Study: NPO Respiratory Status: Nasal cannula;Other (comment) (Noisy breath sounds, wheezing, ?  rhonci) History of Recent Intubation: No Behavior/Cognition: Alert;Cooperative;Pleasant mood;Requires cueing Oral Cavity Assessment: Dry (?partially dissolved medication adhered to hard plate wedged in palatal torus) Oral Care Completed by SLP: Yes Oral Cavity - Dentition: Adequate natural dentition;Missing dentition Self-Feeding Abilities: Total assist Patient Positioning: Upright in bed Baseline Vocal Quality:  (Congested) Volitional Cough: Cognitively unable to elicit Volitional Swallow: Unable to elicit    Oral/Motor/Sensory Function Overall Oral Motor/Sensory Function:  (Unable to asssess.  Pt with hx CVA x2)   Ice Chips Ice chips: Impaired Oral Phase Impairments:  Impaired mastication Pharyngeal Phase Impairments: Suspected delayed Swallow   Thin Liquid Thin Liquid: Impaired Presentation: Spoon;Straw Oral Phase Functional Implications: Right anterior spillage Pharyngeal  Phase Impairments: Throat Clearing - Delayed    Nectar Thick Nectar Thick Liquid: Not tested   Honey Thick Honey Thick Liquid: Not tested   Puree Puree: Impaired Pharyngeal Phase Impairments: Throat Clearing - Delayed   Solid     Solid: Not tested      Anette FORBES Grippe, MA, CCC-SLP Acute Rehabilitation Services Office: [phone] 05/20/2024,9:00 AM

## 2024-05-20 NOTE — ED Notes (Signed)
 Discontinued bair hugger

## 2024-05-20 NOTE — ED Notes (Addendum)
Pt brief and bedding changed 

## 2024-05-20 NOTE — Progress Notes (Signed)
 PT Cancellation Note  Patient Details Name: [name] MRN: [id number] DOB: 06/12/1960   Cancelled Treatment:    Reason Eval/Treat Not Completed: PT screened, no needs identified, will sign off (This pt was a dependent transfer with facility using a lift PTA and didn't propel wheelchair herself either per PT evaluation on last admit to [facility] 05/01/2024. Doesn't appear appropriate for PT. Will sign off.)   Stephane JULIANNA Bevel 05/20/2024, 9:31 AM Keirstyn Aydt M,PT Acute Rehab Services [phone]

## 2024-05-20 NOTE — ED Notes (Signed)
CBG 119 

## 2024-05-20 NOTE — Hospital Course (Signed)
"   [name] is a 64 y.o. female with medical history significant of stroke with left-sided residual weakness, hypertension, hyperlipidemia, type 2 diabetes, non-Hodgkin lymphoma brought by EMS from [facility] presented to the  hospital with respiratory distress.  At her nursing facility she was also noted to have a blood glucose level of 50 and was given oral glucose packs.  Subsequently patient aspirated and started having respiratory distress so was given albuterol  nebulizer.  In the ED patient was hypoxic in the 70s.  She was put on nonrebreather mask and was brought into the hospital for further evaluation and treatment.  In the ED patient was noted to be having temperature of 95.2 and was put on [employer]  Was tachycardic.  Was on 6 L nasal cannula oxygen.  Initial labs were notable for creatinine of 1.2 with WBC at 3.3.  COVID flu and RSV was negative.  CT head was negative for acute findings.  Chest x-ray showed low lung volumes with worsening bibasilar atelectasis.  Code sepsis was activated patient was given broad-spectrum antibiotic and was admitted to the hospital for further evaluation and treatment.  Assessment/Plan   Severe sepsis in the setting of aspiration pneumonia: Patient met sepsis criteria with hypothermia, tachypnea, lactic acidosis and leukopenia.  Currently on 3 0 l oxygen by nasal cannula.  On [place].  COVID flu and RSV was negative.  Blood culture was obtained.  Continue Unasyn .  Wean oxygen as able.  Lactate of 1.2.    Altered mental status likely metabolic encephalopathy Secondary to hypoglycemia.  CT head scan was negative.  Continue PT, speech therapy.  Acute kidney injury. Continue IV hydration.   Type 2 diabetes:  Last A1c 7.6.  Checked on 03/2024.  Continue medications including metformin  and NovoLog .  Add sliding scale insulin .  Essential hypertension: Hold antihypertensive medications.   History of CVA with left-sided hemiparesis CT head scan  negative.  Continue aspirin  statin and Zetia .  Mood disorder: - On Prozac  and BuSpar    History of small lymphocytic lymphoma: Chronic anemia Followed by oncology as outpatient.   GERD: On PPI   Neuropathy: On gabapentin      "

## 2024-05-20 NOTE — Progress Notes (Signed)
 IV Team requested to bedside for PIV start. This RN to bedside, attempted to start IV but pt with altered mental status and unable to remain still with direction.  Additional staff currently unavailable to assist. RN messaged to re-consult when staff available to assist to attempt restart safely.

## 2024-05-21 ENCOUNTER — Encounter (HOSPITAL_COMMUNITY): Payer: Self-pay | Admitting: Internal Medicine

## 2024-05-21 ENCOUNTER — Telehealth: Payer: Self-pay | Admitting: Hematology

## 2024-05-21 ENCOUNTER — Inpatient Hospital Stay (HOSPITAL_COMMUNITY)

## 2024-05-21 DIAGNOSIS — N179 Acute kidney failure, unspecified: Secondary | ICD-10-CM | POA: Diagnosis not present

## 2024-05-21 DIAGNOSIS — T17908A Unspecified foreign body in respiratory tract, part unspecified causing other injury, initial encounter: Secondary | ICD-10-CM | POA: Diagnosis not present

## 2024-05-21 DIAGNOSIS — J9601 Acute respiratory failure with hypoxia: Secondary | ICD-10-CM

## 2024-05-21 DIAGNOSIS — R6520 Severe sepsis without septic shock: Secondary | ICD-10-CM | POA: Diagnosis not present

## 2024-05-21 DIAGNOSIS — A419 Sepsis, unspecified organism: Secondary | ICD-10-CM | POA: Diagnosis not present

## 2024-05-21 LAB — BASIC METABOLIC PANEL WITH GFR
Anion gap: 13 (ref 5–15)
BUN: 34 mg/dL — ABNORMAL HIGH (ref 8–23)
CO2: 27 mmol/L (ref 22–32)
Calcium: 10.6 mg/dL — ABNORMAL HIGH (ref 8.9–10.3)
Chloride: 110 mmol/L (ref 98–111)
Creatinine, Ser: 0.83 mg/dL (ref 0.44–1.00)
GFR, Estimated: >60 mL/min
Glucose, Bld: 139 mg/dL — ABNORMAL HIGH (ref 70–99)
Potassium: 3.7 mmol/L (ref 3.5–5.1)
Sodium: 149 mmol/L — ABNORMAL HIGH (ref 135–145)

## 2024-05-21 LAB — CBC
HCT: 30.1 % — ABNORMAL LOW (ref 36.0–46.0)
Hemoglobin: 9.7 g/dL — ABNORMAL LOW (ref 12.0–15.0)
MCH: 27 pg (ref 26.0–34.0)
MCHC: 32.2 g/dL (ref 30.0–36.0)
MCV: 83.8 fL (ref 80.0–100.0)
Platelets: 207 K/uL (ref 150–400)
RBC: 3.59 MIL/uL — ABNORMAL LOW (ref 3.87–5.11)
RDW: 16.9 % — ABNORMAL HIGH (ref 11.5–15.5)
WBC: 5.2 K/uL (ref 4.0–10.5)
nRBC: 0 % (ref 0.0–0.2)

## 2024-05-21 LAB — GLUCOSE, CAPILLARY
Glucose-Capillary: 196 mg/dL — ABNORMAL HIGH (ref 70–99)
Glucose-Capillary: 178 mg/dL — ABNORMAL HIGH (ref 70–99)
Glucose-Capillary: 212 mg/dL — ABNORMAL HIGH (ref 70–99)
Glucose-Capillary: 154 mg/dL — ABNORMAL HIGH (ref 70–99)

## 2024-05-21 LAB — MAGNESIUM: Magnesium: 2 mg/dL (ref 1.7–2.4)

## 2024-05-21 MED ORDER — PANTOPRAZOLE SODIUM 40 MG PO TBEC
40.0000 mg | DELAYED_RELEASE_TABLET | Freq: Every day | ORAL | Status: DC
  Administered 2024-05-21 – 2024-05-24 (×3): 40 mg via ORAL
  Filled 2024-05-21 (×4): qty 1

## 2024-05-21 MED ORDER — EZETIMIBE 10 MG PO TABS
10.0000 mg | ORAL_TABLET | Freq: Every day | ORAL | Status: DC
  Administered 2024-05-21 – 2024-05-24 (×4): 10 mg via ORAL
  Filled 2024-05-21 (×4): qty 1

## 2024-05-21 MED ORDER — MELATONIN 5 MG PO TABS
5.0000 mg | ORAL_TABLET | Freq: Every evening | ORAL | Status: DC | PRN
  Administered 2024-05-21 – 2024-05-22 (×2): 5 mg via ORAL
  Filled 2024-05-21 (×2): qty 1

## 2024-05-21 MED ORDER — POLYETHYLENE GLYCOL 3350 17 G PO PACK
17.0000 g | PACK | Freq: Every day | ORAL | Status: DC
  Administered 2024-05-21: 17 g via ORAL
  Filled 2024-05-21 (×2): qty 1

## 2024-05-21 MED ORDER — HYDRALAZINE HCL 20 MG/ML IJ SOLN
20.0000 mg | Freq: Three times a day (TID) | INTRAMUSCULAR | Status: DC | PRN
  Administered 2024-05-23: 20 mg via INTRAVENOUS
  Filled 2024-05-21: qty 1

## 2024-05-21 MED ORDER — BUSPIRONE HCL 5 MG PO TABS
10.0000 mg | ORAL_TABLET | Freq: Two times a day (BID) | ORAL | Status: DC
  Administered 2024-05-21 – 2024-05-24 (×7): 10 mg via ORAL
  Filled 2024-05-21 (×7): qty 2

## 2024-05-21 MED ORDER — FLUOXETINE HCL 20 MG PO CAPS
40.0000 mg | ORAL_CAPSULE | Freq: Every day | ORAL | Status: DC
  Administered 2024-05-21 – 2024-05-24 (×4): 40 mg via ORAL
  Filled 2024-05-21 (×4): qty 2

## 2024-05-21 MED ORDER — ASPIRIN 81 MG PO TBEC
81.0000 mg | DELAYED_RELEASE_TABLET | Freq: Every day | ORAL | Status: DC
  Administered 2024-05-21 – 2024-05-24 (×4): 81 mg via ORAL
  Filled 2024-05-21 (×4): qty 1

## 2024-05-21 MED ORDER — GABAPENTIN 100 MG PO CAPS
200.0000 mg | ORAL_CAPSULE | Freq: Three times a day (TID) | ORAL | Status: DC
  Administered 2024-05-21 – 2024-05-24 (×9): 200 mg via ORAL
  Filled 2024-05-21 (×9): qty 2

## 2024-05-21 MED ORDER — DEXTROSE 5 % IV SOLN: INTRAVENOUS | Status: DC

## 2024-05-21 MED ORDER — ATORVASTATIN CALCIUM 80 MG PO TABS
80.0000 mg | ORAL_TABLET | Freq: Every day | ORAL | Status: DC
  Administered 2024-05-21 – 2024-05-24 (×4): 80 mg via ORAL
  Filled 2024-05-21 (×4): qty 1

## 2024-05-21 NOTE — Procedures (Signed)
 Modified Barium Swallow Study  Patient Details  Name: [name] MRN: [id number] Date of Birth: 01-05-1961  Today's Date: 05/21/2024  Modified Barium Swallow completed.  Full report located under Chart Review in the Imaging Section.  History of Present Illness [name] is a 64 y.o. female who presented 1/18 by EMS from [facility] for respiratory distress.  Apparently her blood sugar at facility noted to be 50.  2 oral glucose packs were administered however per EMS report patient aspirated and developed respiratory distress that was treated with albuterol  at facility.  EMS was called and upon EMS arrival patient's oxygen saturation noted to be in 70s.  She was placed on nonrebreather mask and brought to the emergency department for further evaluation and management. Pt known to this service from prior admission. Most recent MBS 10/14/22 with recommendations for mechanical soft diet with thin with use of chin tuck. Pt with medical history significant of stroke with left-sided residual weakness, hypertension, hyperlipidemia, type 2 diabetes, non-Hodgkin lymphoma.   Clinical Impression SLP recommending PO diet of Dys 1 (puree solids), nectar thick liquids. Patient presents with a moderate oropharyngeal dysphagia as per this MBS. Silent aspiration (PAS 8) as well as penetration to the vocal cords that did not clear (PAS 5) occured with thin liquids. Swallow initiation delayed at level of posterior laryngeal surface of the epiglottis and pyriform sinus with thin liquids. No significant amount of barium residuals observed with any of the tested consistencies. She required frequent cues and assistance due to her inability to maintain adequate positioning of head. (at rest, head down in an almost chin tuck position).  Factors that may increase risk of adverse event in presence of aspiration Noe & Lianne 2021): Dependence for feeding and/or oral hygiene;Limited mobility;Respiratory or GI  disease  DIGEST Swallow Severity Rating*  Safety: 2  Efficiency:0  Overall Pharyngeal Swallow Severity: 2 1: mild; 2: moderate; 3: severe; 4: profound  *The Dynamic Imaging Grade of Swallowing Toxicity is standardized for the head and neck cancer population, however, demonstrates promising clinical applications across populations to standardize the clinical rating of pharyngeal swallow safety and severity.  Swallow Evaluation Recommendations Recommendations: PO diet PO Diet Recommendation: Dysphagia 1 (Pureed);Mildly thick liquids (Level 2, nectar thick) Liquid Administration via: Cup Medication Administration: Crushed with puree Supervision: Full supervision/cueing for swallowing strategies;Full assist for feeding Swallowing strategies  : Slow rate;Small bites/sips Postural changes: Position pt fully upright for meals Oral care recommendations: Oral care BID (2x/day);Staff/trained caregiver to provide oral care Caregiver Recommendations: Avoid jello, ice cream, thin soups, popsicles;Remove water pitcher;Have oral suction available    Norleen IVAR Blase, MA, CCC-SLP Speech Therapy  05/21/2024,3:57 PM

## 2024-05-21 NOTE — Telephone Encounter (Signed)
 The rehab facility where the patient is currently at called me to reschedule the patients appt, since the patient is in the hospital. We have rescheduled her for march and the facility/transportation is aware.

## 2024-05-21 NOTE — Progress Notes (Signed)
 Speech Language Pathology Treatment: Dysphagia  Patient Details Name: [name] MRN: [id number] DOB: [birth year] Today's Date: 05/21/2024 Time: 9053-8980 SLP Time Calculation (min) (ACUTE ONLY): 33 min  Assessment / Plan / Recommendation Clinical Impression  Pt seen for ongoing dysphagia management.  Pt down to 2L O2 from 6 and appears to be breathing more comfortable with less wheezing and sounds of congestion.  SLP provided oral care prior to administration of PO trials.  Sister [name] was present for evaluation.  She stated that pt was advanced at [facility] from chopped diet to regular following a swallowing evaluation.  She does not know what evaluation was completed.  Today, pt exhibited multiple swallows with thin liquid.  Prolonged wet cough x1 when used as liquid was.  There was poor oral clearance of solids, with pt expectorating unchewed portions of bolus.  Pt leaning to R today more than in ED.  Sister states she can sit up right once she is pulled up in to position.  Will plan for MBS prior to initiation of PO diet.  Recommend pt remain NPO with alternate means of nutrition, hydration, and medication. Pt may have ice chips in moderation, after good oral care, when fully awake/alert, with upright positioning and direct supervision.   HPI HPI: [name] is a 64 y.o. female who presented 1/18 by EMS from [facility] for respiratory distress.  Apparently her blood sugar at facility noted to be 50.  2 oral glucose packs were administered however per EMS report patient aspirated and developed respiratory distress that was treated with albuterol  at facility.  EMS was called and upon EMS arrival patient's oxygen saturation noted to be in 70s.  She was placed on nonrebreather mask and brought to the emergency department for further evaluation and management. Pt known to this service from prior admission. Most recent MBS 10/14/22 with recommendations for mechanical soft diet with thin with  use of chin tuck. Pt with medical history significant of stroke with left-sided residual weakness, hypertension, hyperlipidemia, type 2 diabetes, non-Hodgkin lymphoma.      SLP Plan  MBS        Swallow Evaluation Recommendations    tbd     Recommendations  Diet recommendations: NPO Medication Administration: Via alternative means                  Oral care QID;Oral care prior to ice chip/H20   Frequent or constant Supervision/Assistance Dysphagia, oropharyngeal phase (R13.12)     MBS     Lisa FORBES Grippe, MA, CCC-SLP Acute Rehabilitation Services Office: [phone] 05/21/2024, 10:24 AM

## 2024-05-21 NOTE — Progress Notes (Signed)
 "                        PROGRESS NOTE        PATIENT DETAILS Name: [name] Age: 64 y.o. Sex: female Date of Birth: 06-06-1960 Admit Date: 05/19/2024 Admitting Physician Velna JONELLE Skeeter, MD ERE:[email or web address], Silvano, DO  Brief Summary: Patient is a 64 y.o.  female with history of CVA-left-sided residual hemiparesis, non-Hodgkin's lymphoma, HTN, HLD, DM-2, peripheral neuropathy-presented to the ED from SNF-with hypoglycemia-found to have hypothermic-suspected to have aspiration pneumonia.  Significant events: 1/18>> admit to [facility].  Significant studies: 1/18>> CXR: Worsening bibasilar atelectasis/left lower lung opacity 1/18>> CT head: No acute intracranial abnormality  Significant microbiology data: 1/18>> COVID/influenza/RSV PCR: Negative 1/18>> blood culture: No growth  Procedures: None  Consults: None  Subjective: Had a choking episode last night-currently n.p.o.-RN to call SLP for reeval.  No other issues overnight.  Objective: Vitals: Blood pressure (!) 165/91, pulse 89, temperature 98.2 F (36.8 C), temperature source Oral, resp. rate 16, height 5' 4 (1.626 m), weight 69 kg, SpO2 91%.   Exam: Gen Exam:Alert awake-not in any distress-but chronically frail appearing. HEENT:atraumatic, normocephalic Chest: B/L clear to auscultation anteriorly CVS:S1S2 regular Abdomen:soft non tender, non distended Extremities:no edema Neurology: Left hemiparesis-at baseline Skin: no rash  Pertinent Labs/Radiology:    Latest Ref Rng & Units 05/21/2024    2:47 AM 05/20/2024    3:12 AM 05/19/2024    7:14 PM  CBC  WBC 4.0 - 10.5 K/uL 5.2  5.7  5.8   Hemoglobin 12.0 - 15.0 g/dL 9.7  9.2  9.0   Hematocrit 36.0 - 46.0 % 30.1  28.6  28.4   Platelets 150 - 400 K/uL 207  206  208     Lab Results  Component Value Date   NA 149 (H) 05/21/2024   K 3.7 05/21/2024   CL 110 05/21/2024   CO2 27 05/21/2024      Assessment/Plan: Severe sepsis secondary to aspiration  pneumonia Sepsis physiology has improved Cultures are negative so far Remains on Unasyn  Await repeat SLP eval  Acute metabolic encephalopathy Secondary to hypoglycemia/sepsis physiology Resolved-relatively awake/alert this morning CT head negative Continue to treat underlying etiologies.  Hypernatremia Encourage oral intake Gentle hydration with D5W Recheck electrolytes tomorrow  AKI Likely hemodynamically mediated Resolved  Hypoglycemia Probably secondary to poor oral intake-continued insulin  use As well with supportive care Recheck A1c-May need adjustment of insulin  regimen on discharge.  DM-2 (A1c 7.6 on November 2025) Hypoglycemic on initial presentation See above Continue SSI for now.  History of CVA with left hemiparesis At baseline Continue aspirin /statin/Zetia   Peripheral neuropathy Has had neuropathic pain for at least a year since her CVA On Neurontin .  HTN BP on the higher side Continue Coreg /transdermal clonidine /amlodipine -follow and optimize  History of hypothyroidism Recent TSH/free T4 (on 12/30) stable-no longer on methimazole  (per pharmacy-methimazole  last filled October/November)    GERD PPI   Mood disorder Resume Prozac /BuSpar -ordered encephalopathy has resolved   Small lymphocytic lymphoma Follows with oncology-Dr Onesimo Hold venetoclax  for now   Heterogeneous enlarged right thyroid  gland Incidental finding on CT chest-stable for outpatient follow-up with PCP for nonemergent thyroid  ultrasound.   Chronic debility/deconditioning Unclear functional status-poor historian-appears to have significant left-sided hemiplegia from prior CVA. SNF resident  Code status:   Code Status: Full Code   DVT Prophylaxis: heparin  injection 5,000 Units Start: 05/19/24 2200 SCDs Start: 05/19/24 1820   Family Communication: None at bedside   Disposition Plan:  Status is: Inpatient Remains inpatient appropriate because: Severity of illness    Planned Discharge Destination:Skilled nursing facility   Diet: Diet Order             Diet NPO time specified Except for: Sips with Meds  Diet effective now                     Antimicrobial agents: Anti-infectives (From admission, onward)    Start     Dose/Rate Route Frequency Ordered Stop   05/19/24 2300  Ampicillin -Sulbactam (UNASYN ) 3 g in sodium chloride  0.9 % 100 mL IVPB        3 g 200 mL/hr over 30 Minutes Intravenous Every 6 hours 05/19/24 1843     05/19/24 1645  piperacillin -tazobactam (ZOSYN ) IVPB 3.375 g        3.375 g 100 mL/hr over 30 Minutes Intravenous  Once 05/19/24 1642 05/19/24 1731   05/19/24 1630  vancomycin  (VANCOREADY) IVPB 1250 mg/250 mL        1,250 mg 166.7 mL/hr over 90 Minutes Intravenous  Once 05/19/24 1617 05/19/24 1908   05/19/24 1630  piperacillin -tazobactam (ZOSYN ) IVPB 4.5 g  Status:  Discontinued        4.5 g 200 mL/hr over 30 Minutes Intravenous  Once 05/19/24 1617 05/19/24 1642        MEDICATIONS: Scheduled Meds:  amLODipine   10 mg Oral Daily   carvedilol   12.5 mg Oral BID WC   [START ON 05/25/2024] cloNIDine   0.1 mg Transdermal Q Sat   heparin   5,000 Units Subcutaneous Q8H   insulin  aspart  0-15 Units Subcutaneous TID WC   insulin  aspart  0-5 Units Subcutaneous QHS   Continuous Infusions:  ampicillin -sulbactam (UNASYN ) IV 3 g (05/21/24 0529)   dextrose  50 mL/hr at 05/21/24 0810   PRN Meds:.acetaminophen  **OR** acetaminophen , albuterol , ondansetron  **OR** ondansetron  (ZOFRAN ) IV, oxyCODONE    I have personally reviewed following labs and imaging studies  LABORATORY DATA: CBC: Recent Labs  Lab 05/19/24 1604 05/19/24 1609 05/19/24 1914 05/20/24 0312 05/21/24 0247  WBC 3.3*  --  5.8 5.7 5.2  NEUTROABS 2.3  --   --   --   --   HGB 9.9* 10.2* 9.0* 9.2* 9.7*  HCT 31.0* 30.0* 28.4* 28.6* 30.1*  MCV 86.6  --  86.1 85.6 83.8  PLT 248  --  208 206 207    Basic Metabolic Panel: Recent Labs  Lab 05/19/24 1603  05/19/24 1604 05/19/24 1609 05/19/24 1914 05/20/24 0312 05/21/24 0247  NA 145 145 145  --  143 149*  K 4.1 4.2 4.1  --  4.4 3.7  CL 107 107  --   --  105 110  CO2  --  26  --   --  25 27  GLUCOSE 77 82  --   --  148* 139*  BUN 62* 67*  --   --  66* 34*  CREATININE 1.60* 1.29*  --  1.24* 1.31* 0.83  CALCIUM   --  10.3  --   --  10.0 10.6*  MG  --   --   --   --   --  2.0    GFR: Estimated Creatinine Clearance: 64.4 mL/min (by C-G formula based on SCr of 0.83 mg/dL).  Liver Function Tests: Recent Labs  Lab 05/19/24 1604 05/20/24 0312  AST 23 19  ALT 22 18  ALKPHOS 72 67  BILITOT 0.3 0.3  PROT 7.2 6.7  ALBUMIN 3.6 3.3*   No results  for input(s): LIPASE, AMYLASE in the last 168 hours. No results for input(s): AMMONIA in the last 168 hours.  Coagulation Profile: No results for input(s): INR, PROTIME in the last 168 hours.  Cardiac Enzymes: No results for input(s): CKTOTAL, CKMB, CKMBINDEX, TROPONINI in the last 168 hours.  BNP (last 3 results) Recent Labs    04/30/24 0946  PROBNP 1,432.0*    Lipid Profile: No results for input(s): CHOL, HDL, LDLCALC, TRIG, CHOLHDL, LDLDIRECT in the last 72 hours.  Thyroid  Function Tests: No results for input(s): TSH, T4TOTAL, FREET4, T3FREE, THYROIDAB in the last 72 hours.  Anemia Panel: No results for input(s): VITAMINB12, FOLATE, FERRITIN, TIBC, IRON, RETICCTPCT in the last 72 hours.  Urine analysis:    Component Value Date/Time   COLORURINE STRAW (A) 05/20/2024 2000   APPEARANCEUR CLEAR 05/20/2024 2000   LABSPEC 1.009 05/20/2024 2000   PHURINE 7.0 05/20/2024 2000   GLUCOSEU NEGATIVE 05/20/2024 2000   HGBUR SMALL (A) 05/20/2024 2000   HGBUR negative 03/12/2010 0847   BILIRUBINUR NEGATIVE 05/20/2024 2000   BILIRUBINUR n 09/11/2015 1039   KETONESUR 5 (A) 05/20/2024 2000   PROTEINUR 100 (A) 05/20/2024 2000   UROBILINOGEN 1.0 09/11/2015 1039   UROBILINOGEN 1.0  05/19/2014 1858   NITRITE NEGATIVE 05/20/2024 2000   LEUKOCYTESUR NEGATIVE 05/20/2024 2000    Sepsis Labs: Lactic Acid, Venous    Component Value Date/Time   LATICACIDVEN 1.2 05/19/2024 2214    MICROBIOLOGY: Recent Results (from the past 240 hours)  Resp panel by RT-PCR (RSV, Flu A&B, Covid) Anterior Nasal Swab     Status: None   Collection Time: 05/19/24  4:05 PM   Specimen: Anterior Nasal Swab  Result Value Ref Range Status   SARS Coronavirus 2 by RT PCR NEGATIVE NEGATIVE Final   Influenza A by PCR NEGATIVE NEGATIVE Final   Influenza B by PCR NEGATIVE NEGATIVE Final    Comment: (NOTE) The Xpert Xpress SARS-CoV-2/FLU/RSV plus assay is intended as an aid in the diagnosis of influenza from Nasopharyngeal swab specimens and should not be used as a sole basis for treatment. Nasal washings and aspirates are unacceptable for Xpert Xpress SARS-CoV-2/FLU/RSV testing.  Fact Sheet for Patients: bloggercourse.com  Fact Sheet for Healthcare Providers: seriousbroker.it  This test is not yet approved or cleared by the [place]  FDA and has been authorized for detection and/or diagnosis of SARS-CoV-2 by FDA under an Emergency Use Authorization (EUA). This EUA will remain in effect (meaning this test can be used) for the duration of the COVID-19 declaration under Section 564(b)(1) of the Act, 21 U.S.C. section 360bbb-3(b)(1), unless the authorization is terminated or revoked.     Resp Syncytial Virus by PCR NEGATIVE NEGATIVE Final    Comment: (NOTE) Fact Sheet for Patients: bloggercourse.com  Fact Sheet for Healthcare Providers: seriousbroker.it  This test is not yet approved or cleared by the [place]  FDA and has been authorized for detection and/or diagnosis of SARS-CoV-2 by FDA under an Emergency Use Authorization (EUA). This EUA will remain in effect (meaning this test  can be used) for the duration of the COVID-19 declaration under Section 564(b)(1) of the Act, 21 U.S.C. section 360bbb-3(b)(1), unless the authorization is terminated or revoked.  Performed at [facility], [address], [place], [place] [address]   Blood culture (routine x 2)     Status: None (Preliminary result)   Collection Time: 05/19/24  4:18 PM   Specimen: BLOOD RIGHT HAND  Result Value Ref Range Status   Specimen  Description BLOOD RIGHT HAND  Final   Special Requests   Final    BOTTLES DRAWN AEROBIC AND ANAEROBIC Blood Culture results may not be optimal due to an inadequate volume of blood received in culture bottles   Culture   Final    NO GROWTH 2 DAYS Performed at [facility], [address] [place], [place] [address]    Report Status PENDING  Incomplete  Blood culture (routine x 2)     Status: None (Preliminary result)   Collection Time: 05/19/24  4:23 PM   Specimen: BLOOD  Result Value Ref Range Status   Specimen Description BLOOD LEFT ANTECUBITAL  Final   Special Requests   Final    BOTTLES DRAWN AEROBIC AND ANAEROBIC Blood Culture adequate volume   Culture   Final    NO GROWTH 2 DAYS Performed at [facility], [address] [place], [place] [address]    Report Status PENDING  Incomplete    RADIOLOGY STUDIES/RESULTS: CT Head Wo Contrast Result Date: 05/19/2024 EXAM: CT HEAD WITHOUT CONTRAST 05/19/2024 04:56:11 PM TECHNIQUE: CT of the head was performed without the administration of intravenous contrast. Automated exposure control, iterative reconstruction, and/or weight based adjustment of the mA/kV was utilized to reduce the radiation dose to as low as reasonably achievable. COMPARISON: CT head without contrast 04/29/2024 and MR head without contrast 04/30/2024. CLINICAL HISTORY: Mental status change, unknown cause. FINDINGS: BRAIN AND VENTRICLES: No acute hemorrhage. No evidence of acute infarct. Remote lacunar infarcts of the cerebellum are  stable. No hydrocephalus. No extra-axial collection. No mass effect or midline shift. Atherosclerotic calcifications within the cavernous internal carotid arteries. ORBITS: No acute abnormality. SINUSES: No acute abnormality. SOFT TISSUES AND SKULL: No acute soft tissue abnormality. No skull fracture. IMPRESSION: 1. No acute intracranial abnormality. 2. Stable remote lacunar infarcts of the cerebellum. Electronically signed by: Lonni Necessary MD 05/19/2024 05:21 PM EST RP Workstation: [id number]   DG Chest Portable 1 View Result Date: 05/19/2024 EXAM: 1 VIEW(S) XRAY OF THE CHEST 05/19/2024 04:18:00 PM COMPARISON: 04/30/2024 CLINICAL HISTORY: Hypoxia Hypoxia Hypoxia Hypoxia Hypoxia FINDINGS: LINES, TUBES AND DEVICES: Numerous leads and wires over chest. LUNGS AND PLEURA: Low lung volumes, accentuating pulmonary interstitium. Left lower lung opacity favored to be related to scarring. Slightly worsened bibasilar atelectasis. No pleural effusion. No pneumothorax. HEART AND MEDIASTINUM: Mild cardiomegaly. Atherosclerosis in transverse aorta. BONES AND SOFT TISSUES: No acute osseous abnormality. IMPRESSION: 1. Low lung volumes with slightly worsened bibasilar atelectasis and left lower lung opacity favored to be related to scarring. 2. Mild cardiomegaly. Electronically signed by: Norleen Boxer MD 05/19/2024 05:08 PM EST RP Workstation: [id number]     LOS: 2 days   Donalda Applebaum, MD  [facility]    To contact the attending provider between 7A-7P or the covering provider during after hours 7P-7A, please log into the web site [email or web address] and access using universal  password for that web site. If you do not have the password, please call the hospital operator.  05/21/2024, 9:56 AM    "

## 2024-05-21 NOTE — TOC Initial Note (Signed)
 Transition of Care [facility]) - Initial/Assessment Note    Patient Details  Name: [name] MRN: [id number] Date of Birth: 1961-01-13  Transition of Care [facility]) CM/SW Contact:    Inocente GORMAN Kindle, LCSW Phone Number: 05/21/2024, 12:29 PM  Clinical Narrative:                 Patient admitted from [facility] under LTC. CSW following for transition needs.     Expected Discharge Plan: Skilled Nursing Facility Barriers to Discharge: Continued Medical Work up   Patient Goals and CMS Choice Patient states their goals for this hospitalization and ongoing recovery are:: Return to snf          Expected Discharge Plan and Services In-house Referral: Clinical Social Work   Post Acute Care Choice: Nursing Home Living arrangements for the past 2 months: Skilled Nursing Facility                                      Prior Living Arrangements/Services Living arrangements for the past 2 months: Skilled Nursing Facility Lives with:: Facility Resident Patient language and need for interpreter reviewed:: Yes Do you feel safe going back to the place where you live?: Yes      Need for Family Participation in Patient Care: Yes (Comment) Care giver support system in place?: Yes (comment)   Criminal Activity/Legal Involvement Pertinent to Current Situation/Hospitalization: No - Comment as needed  Activities of Daily Living   ADL Screening (condition at time of admission) Independently performs ADLs?: No Does the patient have a NEW difficulty with bathing/dressing/toileting/self-feeding that is expected to last >3 days?: No Does the patient have a NEW difficulty with getting in/out of bed, walking, or climbing stairs that is expected to last >3 days?: No Does the patient have a NEW difficulty with communication that is expected to last >3 days?: No Is the patient deaf or have difficulty hearing?: No Does the patient have difficulty seeing, even when wearing glasses/contacts?: No Does  the patient have difficulty concentrating, remembering, or making decisions?: Yes  Permission Sought/Granted Permission sought to share information with : Facility Medical Sales Representative, Family Supports Permission granted to share information with : No  Share Information with NAME: Merilee Ache Sister [phone]  Permission granted to share info w AGENCY: [name]        Emotional Assessment Appearance:: Appears stated age Attitude/Demeanor/Rapport: Unable to Assess Affect (typically observed): Unable to Assess Orientation: : Oriented to Self, Oriented to Place Alcohol / Substance Use: Not Applicable Psych Involvement: No (comment)  Admission diagnosis:  Acute respiratory failure with hypoxia (HCC) [J96.01] Sepsis (HCC) [A41.9] Aspiration into airway, initial encounter [T17.908A] Patient Active Problem List   Diagnosis Date Noted   Sepsis (HCC) 05/19/2024   Acute metabolic encephalopathy 04/30/2024   Disorientation 03/01/2024   Protein-calorie malnutrition, severe 10/08/2022   Severe sepsis (HCC) 10/05/2022   Dehydration 10/05/2022   Acute prerenal azotemia 10/05/2022   Proctitis 10/05/2022   Hypercalcemia 10/05/2022   Transaminitis 10/05/2022   Pyuria 10/05/2022   Depression 10/05/2022   CAP (community acquired pneumonia) 10/04/2022   Malnutrition of moderate degree 09/26/2022   SOB (shortness of breath) 09/25/2022   Nausea vomiting and diarrhea 09/24/2022   Shortness of breath 09/24/2022   Acute respiratory failure with hypoxia (HCC) 09/24/2022   LFT elevation 09/24/2022   Diabetes mellitus (HCC) 09/06/2022   Rectal ulcer 07/09/2022   Colon ulcer 07/08/2022  Rectal bleeding 07/07/2022   Anemia 07/07/2022   GI bleed 07/06/2022   Dysarthria 06/12/2022   Aspiration pneumonia (HCC) 06/12/2022   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   Anxiety disorder due to medical condition 06/10/2022   AKI (acute kidney injury) 06/01/2022   Dizziness 05/29/2022   Personal  history of CLL (chronic lymphocytic leukemia) 05/29/2022   Rectal pain 05/29/2022   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 disease, with long-term current use of insulin  (HCC) 04/14/2022   Type 2 diabetes mellitus with diabetic polyneuropathy, with long-term current use of insulin  (HCC) 04/14/2022   Antiplatelet or antithrombotic long-term use 03/16/2022   HLD (hyperlipidemia) 10/01/2021   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HCC) 09/17/2021   Stage 3a chronic kidney disease (CKD) (HCC) 09/16/2021   Thyroid  nodule 09/16/2021   Cervical lymphadenopathy 09/16/2021   Acute CVA (cerebrovascular accident) (HCC) 09/15/2021   GERD (gastroesophageal reflux disease) 04/30/2021   Peripheral arterial disease 04/13/2021   Atherosclerosis of aorta 02/14/2021   Cerebral thrombosis with cerebral infarction 02/08/2021   Abnormal MRI of head 02/07/2021   Vertigo 02/07/2021   Class 1 obesity due to excess calories with body mass index (BMI) of 31.0 to 31.9 in adult 02/07/2021   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   Trigger finger, left ring finger 10/04/2018   Alternating constipation and diarrhea 04/20/2018   Diabetic peripheral neuropathy associated with type 2 diabetes mellitus (HCC) 02/26/2018   DM2 (diabetes mellitus, type 2) (HCC) 07/29/2015   ABNORMALITY OF GAIT 05/10/2010   ECZEMA, ATOPIC 04/03/2009   Hyperlipidemia associated with type 2 diabetes mellitus (HCC) 03/06/2009   BACK PAIN 11/14/2008   Hemiparesis affecting left side as late effect of stroke (HCC) 08/05/2008   DIPLOPIA 07/15/2008   Essential hypertension 07/15/2008   PCP:  [name], DO Pharmacy:   [facility] [place], [place] - [address] [place] [address] Phone: [phone] Fax: [phone]  [facility] - [facility] [address] [place] [address] Phone: [phone] Fax: [phone]     Social Drivers  of Health (SDOH) Social History: SDOH Screenings   Food Insecurity: No Food Insecurity (05/20/2024)  Housing: Low Risk (05/20/2024)  Transportation Needs: No Transportation Needs (05/20/2024)  Utilities: Not At Risk (05/20/2024)  Depression (PHQ2-9): Low Risk (03/16/2022)  Social Connections: Patient Declined (05/20/2024)  Tobacco Use: Low Risk (05/19/2024)   SDOH Interventions:     Readmission Risk Interventions    05/21/2024   12:28 PM  Readmission Risk Prevention Plan  Transportation Screening Complete  Medication Review (RN Care Manager) Complete  PCP or Specialist appointment within 3-5 days of discharge Complete  HRI or Home Care Consult Complete  SW Recovery Care/Counseling Consult Complete  Palliative Care Screening Not Applicable  Skilled Nursing Facility Complete

## 2024-05-21 NOTE — Plan of Care (Signed)
" °  Problem: Clinical Measurements: Goal: Diagnostic test results will improve Outcome: Progressing Goal: Signs and symptoms of infection will decrease Outcome: Progressing   Problem: Respiratory: Goal: Ability to maintain adequate ventilation will improve Outcome: Progressing   Problem: Coping: Goal: Ability to adjust to condition or change in health will improve Outcome: Progressing   Problem: Metabolic: Goal: Ability to maintain appropriate glucose levels will improve Outcome: Progressing   Problem: Health Behavior/Discharge Planning: Goal: Ability to manage health-related needs will improve Outcome: Progressing   "

## 2024-05-21 NOTE — Progress Notes (Signed)
 " PROGRESS NOTE    [name]  FMW:[id number] DOB: 06/27/1960 DOA: 05/19/2024 PCP: [name], DO   Chief Complaint  Patient presents with   Respiratory Distress    Brief Narrative: Pt is a 64 y/o female with a past medical history of vertebral artery stenosis and CVA on aspirin , hypertension on amlodipine , carvedilol , and hydrochlorothiazide , hyperlipidemia on ezetimibe  and atorvastatin , type 2 diabetes mellitus on metformin , GERD, and depression. She was brought to the ED by EMS from [facility] nursing facility after her CBG at the facility was 50, and 2 oral glucose tablets were administered. She aspirated on the tablets and developed respiratory distress. Upon arrival at the ED, her temp was 95.2 and empiric antibiotics were started for septicemia. She was admitted for further treatment and evaluation.   Assessment & Plan:   Principal Problem:   Sepsis (HCC)   Aspiration pneumonia Sepsis -continue Unasyn  -blood cultures pending -diet evaluation by speech  Hypernatremia -trend levels  Hypoglycemia -resolved -trend glucose -likely due to poor oral intake and inappropriate insulin  use  Acute Kidney Injury -resolved -discontinue IV fluids  Hx of CVA with L sided deficits -continue aspirin   Type 2 diabetes mellitus -hold metformin  -trend glucose  HTN -continue carvedilol  -continue amlodipine  -hold hydrochlorothiazide    History of hypothyroidism -has been stable on methimazole  (not refilled)  Depression -continue buspirone  -continue clonidine  -continue fluoxetine   Hyperlipidemia -continue ezetimibe  -continue atorvastatin     DVT prophylaxis: Heparin  5000 units SQ q 8 hours Code Status: Full Family Communication: Sister Sheliah) present at bedside Disposition:   Status is: Inpatient Remains inpatient appropriate because: IV abx    Antimicrobials: (specify start and planned stop date. Auto populated tables are space occupying and do not  give end dates) Unasyn  3g IV q 6 hours   Subjective: Difficult to obtain information from patient due to limited vocal communication.  Objective: Vitals:   05/20/24 2104 05/21/24 0000 05/21/24 0330 05/21/24 0800  BP: (!) 160/88 (!) 158/79 (!) 165/91   Pulse: 76 83 89   Resp: (!) 22 14 16    Temp:  98.7 F (37.1 C) 98.4 F (36.9 C) 98.2 F (36.8 C)  TempSrc:  Axillary Oral Oral  SpO2: 93% 93% 91%   Weight:      Height:        Intake/Output Summary (Last 24 hours) at 05/21/2024 1002 Last data filed at 05/21/2024 0531 Gross per 24 hour  Intake --  Output 2200 ml  Net -2200 ml   Filed Weights   05/19/24 1558  Weight: 69 kg    Examination:  General exam: Appears calm and comfortable  Respiratory system: Clear to auscultation. Respiratory effort normal. Cardiovascular system: S1 & S2 heard, RRR. No JVD, murmurs, rubs, gallops or clicks. 1+ pitting LE edema Gastrointestinal system: Abdomen is nondistended, soft. No organomegaly or masses felt. Normal bowel sounds heard. Grimaced when abdomen was palpated. Central nervous system: Alert. Diminished mental status at baseline. Extremities: Loss of strength on L hemisphere from prior stroke. Strength intact on R hemisphere. Skin: No rashes, lesions or ulcers Psychiatry: Patient calling sister's name. Limited verbal communication.    Data Reviewed: I have personally reviewed following labs and imaging studies  CBC: Recent Labs  Lab 05/19/24 1604 05/19/24 1609 05/19/24 1914 05/20/24 0312 05/21/24 0247  WBC 3.3*  --  5.8 5.7 5.2  NEUTROABS 2.3  --   --   --   --   HGB 9.9* 10.2* 9.0* 9.2* 9.7*  HCT 31.0* 30.0* 28.4* 28.6*  30.1*  MCV 86.6  --  86.1 85.6 83.8  PLT 248  --  208 206 207    Basic Metabolic Panel: Recent Labs  Lab 05/19/24 1603 05/19/24 1604 05/19/24 1609 05/19/24 1914 05/20/24 0312 05/21/24 0247  NA 145 145 145  --  143 149*  K 4.1 4.2 4.1  --  4.4 3.7  CL 107 107  --   --  105 110  CO2  --  26   --   --  25 27  GLUCOSE 77 82  --   --  148* 139*  BUN 62* 67*  --   --  66* 34*  CREATININE 1.60* 1.29*  --  1.24* 1.31* 0.83  CALCIUM   --  10.3  --   --  10.0 10.6*  MG  --   --   --   --   --  2.0    GFR: Estimated Creatinine Clearance: 64.4 mL/min (by C-G formula based on SCr of 0.83 mg/dL).  Liver Function Tests: Recent Labs  Lab 05/19/24 1604 05/20/24 0312  AST 23 19  ALT 22 18  ALKPHOS 72 67  BILITOT 0.3 0.3  PROT 7.2 6.7  ALBUMIN 3.6 3.3*    CBG: Recent Labs  Lab 05/20/24 0707 05/20/24 1127 05/20/24 1732 05/20/24 2116 05/21/24 0832  GLUCAP 137* 119* 143* 150* 196*     Recent Results (from the past 240 hours)  Resp panel by RT-PCR (RSV, Flu A&B, Covid) Anterior Nasal Swab     Status: None   Collection Time: 05/19/24  4:05 PM   Specimen: Anterior Nasal Swab  Result Value Ref Range Status   SARS Coronavirus 2 by RT PCR NEGATIVE NEGATIVE Final   Influenza A by PCR NEGATIVE NEGATIVE Final   Influenza B by PCR NEGATIVE NEGATIVE Final    Comment: (NOTE) The Xpert Xpress SARS-CoV-2/FLU/RSV plus assay is intended as an aid in the diagnosis of influenza from Nasopharyngeal swab specimens and should not be used as a sole basis for treatment. Nasal washings and aspirates are unacceptable for Xpert Xpress SARS-CoV-2/FLU/RSV testing.  Fact Sheet for Patients: bloggercourse.com  Fact Sheet for Healthcare Providers: seriousbroker.it  This test is not yet approved or cleared by the [place]  FDA and has been authorized for detection and/or diagnosis of SARS-CoV-2 by FDA under an Emergency Use Authorization (EUA). This EUA will remain in effect (meaning this test can be used) for the duration of the COVID-19 declaration under Section 564(b)(1) of the Act, 21 U.S.C. section 360bbb-3(b)(1), unless the authorization is terminated or revoked.     Resp Syncytial Virus by PCR NEGATIVE NEGATIVE Final    Comment:  (NOTE) Fact Sheet for Patients: bloggercourse.com  Fact Sheet for Healthcare Providers: seriousbroker.it  This test is not yet approved or cleared by the [place]  FDA and has been authorized for detection and/or diagnosis of SARS-CoV-2 by FDA under an Emergency Use Authorization (EUA). This EUA will remain in effect (meaning this test can be used) for the duration of the COVID-19 declaration under Section 564(b)(1) of the Act, 21 U.S.C. section 360bbb-3(b)(1), unless the authorization is terminated or revoked.  Performed at [facility], [address] [place], [place] [address]   Blood culture (routine x 2)     Status: None (Preliminary result)   Collection Time: 05/19/24  4:18 PM   Specimen: BLOOD RIGHT HAND  Result Value Ref Range Status   Specimen Description BLOOD RIGHT HAND  Final   Special Requests  Final    BOTTLES DRAWN AEROBIC AND ANAEROBIC Blood Culture results may not be optimal due to an inadequate volume of blood received in culture bottles   Culture   Final    NO GROWTH 2 DAYS Performed at [facility], [address], [place], [place] [address]    Report Status PENDING  Incomplete  Blood culture (routine x 2)     Status: None (Preliminary result)   Collection Time: 05/19/24  4:23 PM   Specimen: BLOOD  Result Value Ref Range Status   Specimen Description BLOOD LEFT ANTECUBITAL  Final   Special Requests   Final    BOTTLES DRAWN AEROBIC AND ANAEROBIC Blood Culture adequate volume   Culture   Final    NO GROWTH 2 DAYS Performed at [facility], [address], [place], [place] [address]    Report Status PENDING  Incomplete         Radiology Studies: CT Head Wo Contrast Result Date: 05/19/2024 EXAM: CT HEAD WITHOUT CONTRAST 05/19/2024 04:56:11 PM TECHNIQUE: CT of the head was performed without the administration of intravenous contrast. Automated exposure control, iterative  reconstruction, and/or weight based adjustment of the mA/kV was utilized to reduce the radiation dose to as low as reasonably achievable. COMPARISON: CT head without contrast 04/29/2024 and MR head without contrast 04/30/2024. CLINICAL HISTORY: Mental status change, unknown cause. FINDINGS: BRAIN AND VENTRICLES: No acute hemorrhage. No evidence of acute infarct. 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 No hydrocephalus. No extra-axial collection. No mass effect or midline shift. Atherosclerotic calcifications within the cavernous internal carotid arteries. ORBITS: No acute abnormality. SINUSES: No acute abnormality. SOFT TISSUES AND SKULL: No acute soft tissue abnormality. No skull fracture. IMPRESSION: 1. No acute intracranial abnormality. 2. Stable remote lacunar infarcts of the cerebellum. Electronically signed by: Lonni Necessary MD 05/19/2024 05:21 PM EST RP Workstation: [id number]   DG Chest Portable 1 View Result Date: 05/19/2024 EXAM: 1 VIEW(S) XRAY OF THE CHEST 05/19/2024 04:18:00 PM COMPARISON: 04/30/2024 CLINICAL HISTORY: Hypoxia Hypoxia Hypoxia Hypoxia Hypoxia FINDINGS: LINES, TUBES AND DEVICES: Numerous leads and wires over chest. LUNGS AND PLEURA: Low lung volumes, accentuating pulmonary interstitium. Left lower lung opacity favored to be related to scarring. Slightly worsened bibasilar atelectasis. No pleural effusion. No pneumothorax. HEART AND MEDIASTINUM: Mild cardiomegaly. Atherosclerosis in transverse aorta. BONES AND SOFT TISSUES: No acute osseous abnormality. IMPRESSION: 1. Low lung volumes with slightly worsened bibasilar atelectasis and left lower lung opacity favored to be related to scarring. 2. Mild cardiomegaly. Electronically signed by: Norleen Boxer MD 05/19/2024 05:08 PM EST RP Workstation: [id number]        Scheduled Meds:  amLODipine   10 mg Oral Daily   carvedilol   12.5 mg Oral BID WC   [START ON 05/25/2024] cloNIDine   0.1 mg Transdermal Q Sat   heparin    5,000 Units Subcutaneous Q8H   insulin  aspart  0-15 Units Subcutaneous TID WC   insulin  aspart  0-5 Units Subcutaneous QHS   Continuous Infusions:  ampicillin -sulbactam (UNASYN ) IV 3 g (05/21/24 0529)   dextrose  50 mL/hr at 05/21/24 0810     LOS: 2 days      Ryland Smoots, PA-S [facility]   To contact the attending provider between 7A-7P or the covering provider during after hours 7P-7A, please log into the web site [email or web address] and access using universal [place] password for that web site. If you do not have the password, please call the hospital operator.  05/21/2024, 10:02 AM   "

## 2024-05-22 ENCOUNTER — Inpatient Hospital Stay

## 2024-05-22 ENCOUNTER — Inpatient Hospital Stay: Admitting: Hematology

## 2024-05-22 DIAGNOSIS — A419 Sepsis, unspecified organism: Secondary | ICD-10-CM | POA: Diagnosis not present

## 2024-05-22 DIAGNOSIS — R6520 Severe sepsis without septic shock: Secondary | ICD-10-CM | POA: Diagnosis not present

## 2024-05-22 DIAGNOSIS — N179 Acute kidney failure, unspecified: Secondary | ICD-10-CM | POA: Diagnosis not present

## 2024-05-22 LAB — BASIC METABOLIC PANEL WITH GFR
Anion gap: 10 (ref 5–15)
Anion gap: 9 (ref 5–15)
Anion gap: 10 (ref 5–15)
BUN: 20 mg/dL (ref 8–23)
BUN: 15 mg/dL (ref 8–23)
BUN: 13 mg/dL (ref 8–23)
CO2: 32 mmol/L (ref 22–32)
CO2: 30 mmol/L (ref 22–32)
CO2: 29 mmol/L (ref 22–32)
Calcium: 10.3 mg/dL (ref 8.9–10.3)
Calcium: 10.4 mg/dL — ABNORMAL HIGH (ref 8.9–10.3)
Calcium: 10.1 mg/dL (ref 8.9–10.3)
Chloride: 108 mmol/L (ref 98–111)
Chloride: 109 mmol/L (ref 98–111)
Chloride: 108 mmol/L (ref 98–111)
Creatinine, Ser: 0.86 mg/dL (ref 0.44–1.00)
Creatinine, Ser: 0.81 mg/dL (ref 0.44–1.00)
Creatinine, Ser: 0.87 mg/dL (ref 0.44–1.00)
GFR, Estimated: >60 mL/min
GFR, Estimated: >60 mL/min
GFR, Estimated: >60 mL/min
Glucose, Bld: 162 mg/dL — ABNORMAL HIGH (ref 70–99)
Glucose, Bld: 233 mg/dL — ABNORMAL HIGH (ref 70–99)
Glucose, Bld: 175 mg/dL — ABNORMAL HIGH (ref 70–99)
Potassium: 3.2 mmol/L — ABNORMAL LOW (ref 3.5–5.1)
Potassium: 4.1 mmol/L (ref 3.5–5.1)
Potassium: 3.9 mmol/L (ref 3.5–5.1)
Sodium: 150 mmol/L — ABNORMAL HIGH (ref 135–145)
Sodium: 149 mmol/L — ABNORMAL HIGH (ref 135–145)
Sodium: 147 mmol/L — ABNORMAL HIGH (ref 135–145)

## 2024-05-22 LAB — GLUCOSE, CAPILLARY
Glucose-Capillary: 171 mg/dL — ABNORMAL HIGH (ref 70–99)
Glucose-Capillary: 226 mg/dL — ABNORMAL HIGH (ref 70–99)
Glucose-Capillary: 200 mg/dL — ABNORMAL HIGH (ref 70–99)
Glucose-Capillary: 147 mg/dL — ABNORMAL HIGH (ref 70–99)

## 2024-05-22 LAB — CBC
HCT: 28.2 % — ABNORMAL LOW (ref 36.0–46.0)
Hemoglobin: 9.1 g/dL — ABNORMAL LOW (ref 12.0–15.0)
MCH: 27.2 pg (ref 26.0–34.0)
MCHC: 32.3 g/dL (ref 30.0–36.0)
MCV: 84.2 fL (ref 80.0–100.0)
Platelets: 207 K/uL (ref 150–400)
RBC: 3.35 MIL/uL — ABNORMAL LOW (ref 3.87–5.11)
RDW: 16.7 % — ABNORMAL HIGH (ref 11.5–15.5)
WBC: 5.4 K/uL (ref 4.0–10.5)
nRBC: 0 % (ref 0.0–0.2)

## 2024-05-22 MED ORDER — DEXTROSE 5 % IV SOLN: INTRAVENOUS | Status: DC

## 2024-05-22 MED ORDER — SENNOSIDES-DOCUSATE SODIUM 8.6-50 MG PO TABS
1.0000 | ORAL_TABLET | Freq: Two times a day (BID) | ORAL | Status: DC
  Administered 2024-05-22 – 2024-05-24 (×5): 1 via ORAL
  Filled 2024-05-22 (×5): qty 1

## 2024-05-22 MED ORDER — POTASSIUM CHLORIDE 20 MEQ PO PACK
40.0000 meq | PACK | Freq: Once | ORAL | Status: AC
  Administered 2024-05-22: 40 meq via ORAL
  Filled 2024-05-22: qty 2

## 2024-05-22 MED ORDER — POTASSIUM CHLORIDE 10 MEQ/100ML IV SOLN
10.0000 meq | INTRAVENOUS | Status: AC
  Administered 2024-05-22: 10 meq via INTRAVENOUS
  Filled 2024-05-22: qty 100

## 2024-05-22 NOTE — Progress Notes (Signed)
 [facility] Progress Note Patient: [name] FMW:[id number] DOB: 1961/03/28  DOA: 05/19/2024 DOS: the patient was seen and examined on 05/22/2024  Brief Summary: Patient is a 64 y.o.  female with history of CVA-left-sided residual hemiparesis, non-Hodgkin's lymphoma, HTN, HLD, DM-2, peripheral neuropathy-presented to the ED from SNF-with hypoglycemia-found to have hypothermic-suspected to have aspiration pneumonia.   Significant events: 1/18>> admit to [facility].   Significant studies: 1/18>> CXR: Worsening bibasilar atelectasis/left lower lung opacity 1/18>> CT head: No acute intracranial abnormality   Significant microbiology data: 1/18>> COVID/influenza/RSV PCR: Negative 1/18>> blood culture: No growth   Consults: None  Assessment and Plan: Severe sepsis secondary to aspiration pneumonia Sepsis physiology has improved Cultures are negative so far Remains on Unasyn   Dysphagia. Patient has known history of dysphagia.  Was on mechanical soft diet which was advanced to regular diet at the facility. Now presents with aspiration pneumonia. SLP following. Underwent MBS and was currently recommended to have dysphagia 2 diet nectar thick. Appreciate SLP evaluation.  Acute metabolic encephalopathy Secondary to hypoglycemia/sepsis physiology Resolved CT head negative Continue to treat underlying etiologies.   Hypernatremia Encourage oral intake Continue with hydration with D5W   AKI Likely hemodynamically mediated Resolved   Type 2 diabetes mellitus with hypoglycemia.  With long-term insulin  use. A1c 7.6 on November 2025 with neuropathy. Hypoglycemic on initial presentation Probably secondary to poor oral intake-continued insulin  use As well with supportive care Recheck A1c May need adjustment of insulin  regimen on discharge. Continue SSI for now.   History of CVA with left hemiparesis No new focal deficit. Continue aspirin /statin/Zetia    Peripheral  neuropathy Has had neuropathic pain for at least a year since her CVA On Neurontin .   HTN BP on the higher side Continue Coreg /transdermal clonidine /amlodipine -follow and optimize   History of hypothyroidism Recent TSH/free T4 (on 12/30) stable-no longer on methimazole  (per pharmacy-methimazole  last filled October/November)    GERD PPI   Mood disorder Resume Prozac /BuSpar -ordered encephalopathy has resolved   Small lymphocytic lymphoma Follows with oncology-Dr Onesimo Hold venetoclax  for now   Heterogeneous enlarged right thyroid  gland Incidental finding on CT chest-stable for outpatient follow-up with PCP for nonemergent thyroid  ultrasound.   Chronic debility/deconditioning Unclear functional status-poor historian-appears to have significant left-sided hemiplegia from prior CVA. SNF resident    Code Status: Full Code   DVT Prophylaxis: heparin  injection 5,000 Units Start: 05/19/24 2200 SCDs Start: 05/19/24 1820 Data review I have Reviewed nursing notes, Vitals, and Lab results. Since last encounter, pertinent lab results CBC and BMP   . I have ordered test including CBC and BMP  .  Family Communication: No one at bedside  Disposition Plan: Status is: Inpatient Remains inpatient appropriate because: Monitor for improvement in mentation and sodium level without any intervention   Planned Discharge Destination: Back to SNF Diet: Diet Order             DIET DYS 2 Room service appropriate? Yes; Fluid consistency: Nectar Thick  Diet effective now                   MEDICATIONS: Scheduled Meds:  amLODipine   10 mg Oral Daily   aspirin  EC  81 mg Oral Daily   atorvastatin   80 mg Oral Daily   busPIRone   10 mg Oral BID   carvedilol   12.5 mg Oral BID WC   [START ON 05/25/2024] cloNIDine   0.1 mg Transdermal Q Sat   ezetimibe   10 mg Oral Daily   FLUoxetine   40 mg Oral Daily  gabapentin   200 mg Oral TID   heparin   5,000 Units Subcutaneous Q8H   insulin  aspart  0-15  Units Subcutaneous TID WC   insulin  aspart  0-5 Units Subcutaneous QHS   pantoprazole   40 mg Oral Daily   senna-docusate  1 tablet Oral BID   Continuous Infusions:  ampicillin -sulbactam (UNASYN ) IV 3 g (05/22/24 1750)   dextrose  75 mL/hr at 05/22/24 1055   PRN Meds:.acetaminophen  **OR** acetaminophen , albuterol , hydrALAZINE , melatonin, ondansetron  **OR** ondansetron  (ZOFRAN ) IV, oxyCODONE   Author: Yetta Blanch, MD  [facility] 05/22/2024  6:05 PM Between 7PM-7AM, please contact night-coverage, check [email or web address] for on call.

## 2024-05-22 NOTE — Plan of Care (Signed)
" °  Problem: Fluid Volume: Goal: Hemodynamic stability will improve Outcome: Progressing   Problem: Respiratory: Goal: Ability to maintain adequate ventilation will improve Outcome: Progressing   Problem: Coping: Goal: Ability to adjust to condition or change in health will improve Outcome: Progressing   Problem: Fluid Volume: Goal: Ability to maintain a balanced intake and output will improve Outcome: Progressing   Problem: Skin Integrity: Goal: Risk for impaired skin integrity will decrease Outcome: Progressing   Problem: Nutrition: Goal: Adequate nutrition will be maintained Outcome: Progressing   Problem: Coping: Goal: Level of anxiety will decrease Outcome: Progressing   Problem: Pain Managment: Goal: General experience of comfort will improve and/or be controlled Outcome: Progressing   Problem: Skin Integrity: Goal: Risk for impaired skin integrity will decrease Outcome: Progressing   "

## 2024-05-22 NOTE — [place] FL2 (Signed)
 " [place]  MEDICAID FL2 LEVEL OF CARE FORM     IDENTIFICATION  Patient Name: [name] Birthdate: [birth year] Sex: female Admission Date (Current Location): 05/19/2024  [facility] and [place] Number:  Producer, Television/film/video and Address:  The [place], [address] [place], [place] [address]      Provider Number: [id number]  Attending Physician Name and Address:  Tobie Yetta HERO, MD  Relative Name and Phone Number:       Current Level of Care: Hospital Recommended Level of Care: Skilled Nursing Facility Prior Approval Number:    Date Approved/Denied:   PASRR Number: [id number]  Discharge Plan: SNF    Current Diagnoses: Patient Active Problem List   Diagnosis Date Noted   Sepsis (HCC) 05/19/2024   Acute metabolic encephalopathy 04/30/2024   Disorientation 03/01/2024   Protein-calorie malnutrition, severe 10/08/2022   Severe sepsis (HCC) 10/05/2022   Dehydration 10/05/2022   Acute prerenal azotemia 10/05/2022   Proctitis 10/05/2022   Hypercalcemia 10/05/2022   Transaminitis 10/05/2022   Pyuria 10/05/2022   Depression 10/05/2022   CAP (community acquired pneumonia) 10/04/2022   Malnutrition of moderate degree 09/26/2022   SOB (shortness of breath) 09/25/2022   Nausea vomiting and diarrhea 09/24/2022   Shortness of breath 09/24/2022   Acute respiratory failure with hypoxia (HCC) 09/24/2022   LFT elevation 09/24/2022   Diabetes mellitus (HCC) 09/06/2022   Rectal ulcer 07/09/2022   Colon ulcer 07/08/2022   Rectal bleeding 07/07/2022   Anemia 07/07/2022   GI bleed 07/06/2022   Dysarthria 06/12/2022   Aspiration pneumonia (HCC) 06/12/2022   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   Anxiety disorder due to medical condition 06/10/2022   AKI (acute kidney injury) 06/01/2022   Dizziness 05/29/2022   Personal history of CLL (chronic lymphocytic leukemia) 05/29/2022   Rectal pain 05/29/2022   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  disease, with long-term current use of insulin  (HCC) 04/14/2022   Type 2 diabetes mellitus with diabetic polyneuropathy, with long-term current use of insulin  (HCC) 04/14/2022   Antiplatelet or antithrombotic long-term use 03/16/2022   HLD (hyperlipidemia) 10/01/2021   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HCC) 09/17/2021   Stage 3a chronic kidney disease (CKD) (HCC) 09/16/2021   Thyroid  nodule 09/16/2021   Cervical lymphadenopathy 09/16/2021   Acute CVA (cerebrovascular accident) (HCC) 09/15/2021   GERD (gastroesophageal reflux disease) 04/30/2021   Peripheral arterial disease 04/13/2021   Atherosclerosis of aorta 02/14/2021   Cerebral thrombosis with cerebral infarction 02/08/2021   Abnormal MRI of head 02/07/2021   Vertigo 02/07/2021   Class 1 obesity due to excess calories with body mass index (BMI) of 31.0 to 31.9 in adult 02/07/2021   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   Trigger finger, left ring finger 10/04/2018   Alternating constipation and diarrhea 04/20/2018   Diabetic peripheral neuropathy associated with type 2 diabetes mellitus (HCC) 02/26/2018   DM2 (diabetes mellitus, type 2) (HCC) 07/29/2015   ABNORMALITY OF GAIT 05/10/2010   ECZEMA, ATOPIC 04/03/2009   Hyperlipidemia associated with type 2 diabetes mellitus (HCC) 03/06/2009   BACK PAIN 11/14/2008   Hemiparesis affecting left side as late effect of stroke (HCC) 08/05/2008   DIPLOPIA 07/15/2008   Essential hypertension 07/15/2008    Orientation RESPIRATION BLADDER Height & Weight     Self, Place  Normal Incontinent, External catheter Weight: 152 lb 1.9 oz (69 kg) Height:  5' 4 (162.6 cm)  BEHAVIORAL SYMPTOMS/MOOD NEUROLOGICAL BOWEL NUTRITION STATUS      Incontinent  Diet (See dc summary)  AMBULATORY STATUS COMMUNICATION OF NEEDS Skin   Extensive Assist Verbally Normal                       Personal Care Assistance Level of Assistance  Feeding, Dressing, Bathing Bathing Assistance: Maximum  assistance Feeding assistance: Limited assistance Dressing Assistance: Maximum assistance     Functional Limitations Info  Sight, Hearing, Speech Sight Info: Impaired Hearing Info: Impaired Speech Info: Impaired    SPECIAL CARE FACTORS FREQUENCY                       Contractures Contractures Info: Not present    Additional Factors Info  Code Status, Allergies, Psychotropic, Insulin  Sliding Scale Code Status Info: Full Allergies Info: NKA Psychotropic Info: See dc summary Insulin  Sliding Scale Info: See dc summary       Current Medications (05/22/2024):  This is the current hospital active medication list Current Facility-Administered Medications  Medication Dose Route Frequency Provider Last Rate Last Admin   acetaminophen  (TYLENOL ) tablet 650 mg  650 mg Oral Q6H PRN Pahwani, Rinka R, MD   650 mg at 05/21/24 1858   Or   acetaminophen  (TYLENOL ) suppository 650 mg  650 mg Rectal Q6H PRN Pahwani, Rinka R, MD       albuterol  (PROVENTIL ) (2.5 MG/3ML) 0.083% nebulizer solution 2.5 mg  2.5 mg Nebulization Q4H PRN Pokhrel, Laxman, MD       amLODipine  (NORVASC ) tablet 10 mg  10 mg Oral Daily Pokhrel, Laxman, MD   10 mg at 05/22/24 1024   Ampicillin -Sulbactam (UNASYN ) 3 g in sodium chloride  0.9 % 100 mL IVPB  3 g Intravenous Q6H Pahwani, Rinka R, MD 200 mL/hr at 05/22/24 0550 3 g at 05/22/24 0550   aspirin  EC tablet 81 mg  81 mg Oral Daily Ghimire, Shanker M, MD   81 mg at 05/22/24 1025   atorvastatin  (LIPITOR ) tablet 80 mg  80 mg Oral Daily Ghimire, Shanker M, MD   80 mg at 05/22/24 1024   busPIRone  (BUSPAR ) tablet 10 mg  10 mg Oral BID Ghimire, Shanker M, MD   10 mg at 05/22/24 1022   carvedilol  (COREG ) tablet 12.5 mg  12.5 mg Oral BID WC Pokhrel, Laxman, MD   12.5 mg at 05/22/24 1025   [START ON 05/25/2024] cloNIDine  (CATAPRES  - Dosed in mg/24 hr) patch 0.1 mg  0.1 mg Transdermal Q Sat Pokhrel, Laxman, MD       dextrose  5 % solution   Intravenous Continuous Tobie Yetta HERO, MD  75 mL/hr at 05/22/24 1055 New Bag at 05/22/24 1055   ezetimibe  (ZETIA ) tablet 10 mg  10 mg Oral Daily Ghimire, Shanker M, MD   10 mg at 05/22/24 1026   FLUoxetine  (PROZAC ) capsule 40 mg  40 mg Oral Daily Ghimire, Shanker M, MD   40 mg at 05/21/24 1532   gabapentin  (NEURONTIN ) capsule 200 mg  200 mg Oral TID Raenelle Donalda HERO, MD   200 mg at 05/22/24 1023   heparin  injection 5,000 Units  5,000 Units Subcutaneous Q8H Pahwani, Rinka R, MD   5,000 Units at 05/22/24 0543   hydrALAZINE  (APRESOLINE ) injection 20 mg  20 mg Intravenous Q8H PRN Ghimire, Donalda HERO, MD       insulin  aspart (novoLOG ) injection 0-15 Units  0-15 Units Subcutaneous TID WC Pahwani, Rinka R, MD   3 Units at 05/22/24 1053   insulin  aspart (novoLOG ) injection 0-5 Units  0-5 Units  Subcutaneous QHS Pahwani, Rinka R, MD       melatonin tablet 5 mg  5 mg Oral QHS PRN Raenelle Donalda HERO, MD   5 mg at 05/21/24 2310   ondansetron  (ZOFRAN ) tablet 4 mg  4 mg Oral Q6H PRN Pahwani, Rinka R, MD       Or   ondansetron  (ZOFRAN ) injection 4 mg  4 mg Intravenous Q6H PRN Pahwani, Rinka R, MD       oxyCODONE  (Oxy IR/ROXICODONE ) immediate release tablet 5 mg  5 mg Oral Q6H PRN Pokhrel, Laxman, MD   5 mg at 05/22/24 9377   pantoprazole  (PROTONIX ) EC tablet 40 mg  40 mg Oral Daily Ghimire, Shanker M, MD   40 mg at 05/21/24 1532   polyethylene glycol (MIRALAX  / GLYCOLAX ) packet 17 g  17 g Oral Daily Raenelle Donalda HERO, MD   17 g at 05/21/24 1533     Discharge Medications: Please see discharge summary for a list of discharge medications.  Relevant Imaging Results:  Relevant Lab Results:   Additional Information [id number]  Inocente GORMAN Kindle, LCSW     "

## 2024-05-22 NOTE — Care Management Important Message (Signed)
 Important Message  Patient Details  Name: [name] MRN: [id number] Date of Birth: 1960/10/01   Important Message Given:  Yes - Medicare IM     Claretta Deed 05/22/2024, 3:49 PM

## 2024-05-22 NOTE — Progress Notes (Signed)
 Speech Language Pathology Treatment: Dysphagia  Patient Details Name: [name] MRN: [id number] DOB: 03-Mar-1961 Today's Date: 05/22/2024 Time: 8975-8964 SLP Time Calculation (min) (ACUTE ONLY): 11 min  Assessment / Plan / Recommendation Clinical Impression  Pt seen for ongoing dysphagia management. Pt tolerated puree with and without crushed medications, and nectar thick liquids.  With serial straw sips of thin liquid there was an audible swallow v gag.  Suspect this was related to taste of meds crushed in puree. She tolerated all other trials of nectar thick without difficulty and MBS 1/20 with no penetration of NTL. Recommend single sips of nectar thick liquids out of an abundance of caution.  Pt tolerated trials of simulated ground consistency texture today without any clinical s/s of aspiration and exhibited adequate oral clearance. There were 2 small pills that RN was unable to crush for administration.  These were given whole with puree.  There was stasis on dorsal surface of tongue.  Pt cleared medications with nectar thick liquid wash.  OF NOTE: RN had miralax  to administer.  When mixed with thickened liquids, miralax  breaks down thickener.  Advised RN not to administer miralax ; recommend alternate stool softener if possible.    Recommend ground/chopped diet with nectar/mildly thick liquids.     HPI HPI: [name] is a 64 y.o. female who presented 1/18 by EMS from [facility] for respiratory distress.  Apparently her blood sugar at facility noted to be 50.  2 oral glucose packs were administered however per EMS report patient aspirated and developed respiratory distress that was treated with albuterol  at facility.  EMS was called and upon EMS arrival patient's oxygen saturation noted to be in 70s.  She was placed on nonrebreather mask and brought to the emergency department for further evaluation and management. Pt known to this service from prior admission. Most recent MBS 10/14/22  with recommendations for mechanical soft diet with thin with use of chin tuck. MBS 05/22/24 with silent aspiration of thins, puree diet with mildly thick liquids initiated. Pt with medical history significant of stroke with left-sided residual weakness, hypertension, hyperlipidemia, type 2 diabetes, non-Hodgkin lymphoma.      SLP Plan  Continue with current plan of care         Recommendations  Diet recommendations: Dysphagia 2 (fine chop);Nectar-thick liquid Liquids provided via: Cup;Straw (single sips) Medication Administration: Crushed with puree Supervision: Trained caregiver to feed patient Compensations: Minimize environmental distractions;Slow rate;Small sips/bites (Single sips) Postural Changes and/or Swallow Maneuvers: Seated upright 90 degrees                  Oral care QID   Frequent or constant Supervision/Assistance Dysphagia, oropharyngeal phase (R13.12)     Continue with current plan of care     Anette FORBES Grippe, MA, CCC-SLP Acute Rehabilitation Services Office: [phone] 05/22/2024, 11:00 AM

## 2024-05-23 ENCOUNTER — Other Ambulatory Visit: Payer: Self-pay

## 2024-05-23 DIAGNOSIS — R6520 Severe sepsis without septic shock: Secondary | ICD-10-CM | POA: Diagnosis not present

## 2024-05-23 DIAGNOSIS — N179 Acute kidney failure, unspecified: Secondary | ICD-10-CM | POA: Diagnosis not present

## 2024-05-23 DIAGNOSIS — A419 Sepsis, unspecified organism: Secondary | ICD-10-CM | POA: Diagnosis not present

## 2024-05-23 LAB — GLUCOSE, CAPILLARY
Glucose-Capillary: 177 mg/dL — ABNORMAL HIGH (ref 70–99)
Glucose-Capillary: 164 mg/dL — ABNORMAL HIGH (ref 70–99)
Glucose-Capillary: 90 mg/dL (ref 70–99)
Glucose-Capillary: 196 mg/dL — ABNORMAL HIGH (ref 70–99)

## 2024-05-23 LAB — BASIC METABOLIC PANEL WITH GFR
Anion gap: 13 (ref 5–15)
Anion gap: 9 (ref 5–15)
Anion gap: 11 (ref 5–15)
BUN: 12 mg/dL (ref 8–23)
BUN: 10 mg/dL (ref 8–23)
BUN: 11 mg/dL (ref 8–23)
CO2: 27 mmol/L (ref 22–32)
CO2: 28 mmol/L (ref 22–32)
CO2: 27 mmol/L (ref 22–32)
Calcium: 10.1 mg/dL (ref 8.9–10.3)
Calcium: 9.8 mg/dL (ref 8.9–10.3)
Calcium: 9.9 mg/dL (ref 8.9–10.3)
Chloride: 106 mmol/L (ref 98–111)
Chloride: 106 mmol/L (ref 98–111)
Chloride: 105 mmol/L (ref 98–111)
Creatinine, Ser: 0.86 mg/dL (ref 0.44–1.00)
Creatinine, Ser: 0.86 mg/dL (ref 0.44–1.00)
Creatinine, Ser: 0.91 mg/dL (ref 0.44–1.00)
GFR, Estimated: >60 mL/min
GFR, Estimated: >60 mL/min
GFR, Estimated: >60 mL/min
Glucose, Bld: 170 mg/dL — ABNORMAL HIGH (ref 70–99)
Glucose, Bld: 195 mg/dL — ABNORMAL HIGH (ref 70–99)
Glucose, Bld: 184 mg/dL — ABNORMAL HIGH (ref 70–99)
Potassium: 3.5 mmol/L (ref 3.5–5.1)
Potassium: 3.5 mmol/L (ref 3.5–5.1)
Potassium: 3.5 mmol/L (ref 3.5–5.1)
Sodium: 145 mmol/L (ref 135–145)
Sodium: 144 mmol/L (ref 135–145)
Sodium: 144 mmol/L (ref 135–145)

## 2024-05-23 LAB — MAGNESIUM: Magnesium: 1.5 mg/dL — ABNORMAL LOW (ref 1.7–2.4)

## 2024-05-23 LAB — CBC
HCT: 30.5 % — ABNORMAL LOW (ref 36.0–46.0)
Hemoglobin: 9.9 g/dL — ABNORMAL LOW (ref 12.0–15.0)
MCH: 26.8 pg (ref 26.0–34.0)
MCHC: 32.5 g/dL (ref 30.0–36.0)
MCV: 82.4 fL (ref 80.0–100.0)
Platelets: 212 K/uL (ref 150–400)
RBC: 3.7 MIL/uL — ABNORMAL LOW (ref 3.87–5.11)
RDW: 16.5 % — ABNORMAL HIGH (ref 11.5–15.5)
WBC: 5 K/uL (ref 4.0–10.5)
nRBC: 0 % (ref 0.0–0.2)

## 2024-05-23 LAB — HEMOGLOBIN A1C
Hgb A1c MFr Bld: 6.4 % — ABNORMAL HIGH (ref 4.8–5.6)
Mean Plasma Glucose: 136.98 mg/dL

## 2024-05-23 MED ORDER — MAGNESIUM SULFATE 4 GM/100ML IV SOLN
4.0000 g | Freq: Once | INTRAVENOUS | Status: AC
  Administered 2024-05-23: 4 g via INTRAVENOUS
  Filled 2024-05-23: qty 100

## 2024-05-23 NOTE — Progress Notes (Signed)
 [facility] Progress Note Patient: [name] FMW:[id number] DOB: 12/28/1960  DOA: 05/19/2024 DOS: the patient was seen and examined on 05/23/2024  Brief Summary: Patient is a 64 y.o.  female with history of CVA-left-sided residual hemiparesis, non-Hodgkin's lymphoma, HTN, HLD, DM-2, peripheral neuropathy-presented to the ED from SNF-with hypoglycemia-found to have hypothermic-suspected to have aspiration pneumonia.   Significant events: 1/18>> admit to [facility].   Significant studies: 1/18>> CXR: Worsening bibasilar atelectasis/left lower lung opacity 1/18>> CT head: No acute intracranial abnormality   Significant microbiology data: 1/18>> COVID/influenza/RSV PCR: Negative 1/18>> blood culture: No growth   Consults: None   Assessment and Plan: Severe sepsis secondary to aspiration pneumonia Sepsis physiology has improved Cultures are negative so far Treated with 5 days of IV antibiotic.   Dysphagia. Patient has known history of dysphagia.  Was on mechanical soft diet which was advanced to regular diet at the facility. Now presents with aspiration pneumonia. SLP following. Underwent MBS and was currently recommended to have dysphagia 2 diet nectar thick. Appreciate SLP evaluation.   Acute metabolic encephalopathy Secondary to hypoglycemia/sepsis physiology Resolved CT head negative Continue to treat underlying etiologies.   Hypernatremia Encourage oral intake Resolved with IV hydration with D5.  Now holding it and monitor for improvement.   AKI Likely hemodynamically mediated Resolved   Type 2 diabetes mellitus with hypoglycemia.  With long-term insulin  use. A1c 7.6 on November 2025 with neuropathy. Hypoglycemic on initial presentation Probably secondary to poor oral intake-continued insulin  use As well with supportive care Recheck A1c May need adjustment of insulin  regimen on discharge. Continue SSI for now.   History of CVA with left hemiparesis No new  focal deficit. Continue aspirin /statin/Zetia    Peripheral neuropathy Has had neuropathic pain for at least a year since her CVA On Neurontin .   HTN BP on the higher side Continue Coreg /transdermal clonidine /amlodipine -follow and optimize   History of hypothyroidism Recent TSH/free T4 (on 12/30) stable-no longer on methimazole  (per pharmacy-methimazole  last filled October/November)    GERD PPI   Mood disorder Resume Prozac /BuSpar -ordered encephalopathy has resolved   Small lymphocytic lymphoma Follows with oncology-Dr Onesimo Hold venetoclax  for now   Heterogeneous enlarged right thyroid  gland Incidental finding on CT chest-stable for outpatient follow-up with PCP for nonemergent thyroid  ultrasound.   Chronic debility/deconditioning Unclear functional status-poor historian-appears to have significant left-sided hemiplegia from prior CVA. SNF resident   Change BMP to twice a day now. Mag replaced.   Code Status: Full Code   DVT Prophylaxis: heparin  injection 5,000 Units Start: 05/19/24 2200 SCDs Start: 05/19/24 1820  Data review I have Reviewed nursing notes, Vitals, and Lab results. Hyponatremia improving.  Sodium 144 now.  Hypomagnesemia with magnesium  1.5.  Hemoglobin A1c 6.4.  Hemoglobin stable. Family Communication: No one at bedside.  Disposition Plan: Status is: Inpatient Remains inpatient appropriate because: Limiting sodium Diet: Diet Order             DIET DYS 2 Room service appropriate? Yes; Fluid consistency: Nectar Thick  Diet effective now                   MEDICATIONS: Scheduled Meds:  amLODipine   10 mg Oral Daily   aspirin  EC  81 mg Oral Daily   atorvastatin   80 mg Oral Daily   busPIRone   10 mg Oral BID   carvedilol   12.5 mg Oral BID WC   [START ON 05/25/2024] cloNIDine   0.1 mg Transdermal Q Sat   ezetimibe   10 mg Oral Daily   FLUoxetine   40 mg Oral Daily   gabapentin   200 mg Oral TID   heparin   5,000 Units Subcutaneous Q8H   insulin   aspart  0-15 Units Subcutaneous TID WC   insulin  aspart  0-5 Units Subcutaneous QHS   pantoprazole   40 mg Oral Daily   senna-docusate  1 tablet Oral BID   Continuous Infusions:  ampicillin -sulbactam (UNASYN ) IV 3 g (05/23/24 0604)   dextrose  50 mL/hr at 05/23/24 0034   PRN Meds:.acetaminophen  **OR** acetaminophen , albuterol , hydrALAZINE , melatonin, ondansetron  **OR** ondansetron  (ZOFRAN ) IV, oxyCODONE   Author: Yetta Blanch, MD  [facility] 05/23/2024  7:58 AM Between 7PM-7AM, please contact night-coverage, check [email or web address] for on call.

## 2024-05-23 NOTE — TOC Progression Note (Signed)
 Transition of Care [facility]) - Progression Note    Patient Details  Name: [name] MRN: [id number] Date of Birth: 1960/10/20  Transition of Care [facility]) CM/SW Contact  Inocente GORMAN Kindle, LCSW Phone Number: 05/23/2024, 8:43 AM  Clinical Narrative:    8:43 AM-CSW continuing to follow for medical stability.    Expected Discharge Plan: Skilled Nursing Facility Barriers to Discharge: Continued Medical Work up               Expected Discharge Plan and Services In-house Referral: Clinical Social Work   Post Acute Care Choice: Nursing Home Living arrangements for the past 2 months: Skilled Nursing Facility                                       Social Drivers of Health (SDOH) Interventions SDOH Screenings   Food Insecurity: No Food Insecurity (05/20/2024)  Housing: Low Risk (05/20/2024)  Transportation Needs: No Transportation Needs (05/20/2024)  Utilities: Not At Risk (05/20/2024)  Depression (PHQ2-9): Low Risk (03/16/2022)  Social Connections: Patient Declined (05/20/2024)  Tobacco Use: Low Risk (05/19/2024)    Readmission Risk Interventions    05/21/2024   12:28 PM  Readmission Risk Prevention Plan  Transportation Screening Complete  Medication Review (RN Care Manager) Complete  PCP or Specialist appointment within 3-5 days of discharge Complete  HRI or Home Care Consult Complete  SW Recovery Care/Counseling Consult Complete  Palliative Care Screening Not Applicable  Skilled Nursing Facility Complete

## 2024-05-24 ENCOUNTER — Other Ambulatory Visit: Payer: Self-pay

## 2024-05-24 DIAGNOSIS — R6520 Severe sepsis without septic shock: Secondary | ICD-10-CM | POA: Diagnosis not present

## 2024-05-24 DIAGNOSIS — A419 Sepsis, unspecified organism: Secondary | ICD-10-CM | POA: Diagnosis not present

## 2024-05-24 DIAGNOSIS — N179 Acute kidney failure, unspecified: Secondary | ICD-10-CM | POA: Diagnosis not present

## 2024-05-24 LAB — CULTURE, BLOOD (ROUTINE X 2)
Culture: NO GROWTH
Culture: NO GROWTH
Special Requests: ADEQUATE

## 2024-05-24 LAB — BASIC METABOLIC PANEL WITH GFR
Anion gap: 12 (ref 5–15)
BUN: 10 mg/dL (ref 8–23)
CO2: 24 mmol/L (ref 22–32)
Calcium: 10 mg/dL (ref 8.9–10.3)
Chloride: 106 mmol/L (ref 98–111)
Creatinine, Ser: 0.92 mg/dL (ref 0.44–1.00)
GFR, Estimated: >60 mL/min
Glucose, Bld: 156 mg/dL — ABNORMAL HIGH (ref 70–99)
Potassium: 3.4 mmol/L — ABNORMAL LOW (ref 3.5–5.1)
Sodium: 142 mmol/L (ref 135–145)

## 2024-05-24 LAB — GLUCOSE, CAPILLARY
Glucose-Capillary: 156 mg/dL — ABNORMAL HIGH (ref 70–99)
Glucose-Capillary: 147 mg/dL — ABNORMAL HIGH (ref 70–99)

## 2024-05-24 LAB — MAGNESIUM: Magnesium: 2.5 mg/dL — ABNORMAL HIGH (ref 1.7–2.4)

## 2024-05-24 LAB — PHOSPHORUS: Phosphorus: 3.2 mg/dL (ref 2.5–4.6)

## 2024-05-24 MED ORDER — OXYCODONE HCL 5 MG PO TABS: 5.0000 mg | ORAL_TABLET | Freq: Three times a day (TID) | ORAL | 0 refills | Status: AC | PRN

## 2024-05-24 MED ORDER — POTASSIUM CHLORIDE CRYS ER 20 MEQ PO TBCR
40.0000 meq | EXTENDED_RELEASE_TABLET | ORAL | Status: AC
  Administered 2024-05-24 (×2): 40 meq via ORAL
  Filled 2024-05-24 (×2): qty 2

## 2024-05-24 NOTE — Progress Notes (Signed)
 AVS in packet for tansport

## 2024-05-24 NOTE — TOC Transition Note (Signed)
 Transition of Care [facility]) - Discharge Note   Patient Details  Name: [name] MRN: [id number] Date of Birth: 1960/11/07  Transition of Care [facility]) CM/SW Contact:  Inocente GORMAN Kindle, LCSW Phone Number: 05/24/2024, 12:43 PM   Clinical Narrative:    Patient will DC to: [facility] Anticipated DC date: 05/24/24 Family notified: Sister, [name] Transport by: [place]   Per MD patient ready for DC to [facility]. RN to call report prior to discharge [phone] [facility] room 300B). RN, patient, patient's family, and facility notified of DC. Discharge Summary and FL2 sent to facility. DC packet on chart including signed script. Ambulance transport requested for patient.   CSW will sign off for now as social work intervention is no longer needed. Please consult us  again if new needs arise.     Final next level of care: Skilled Nursing Facility Barriers to Discharge: Barriers Resolved   Patient Goals and CMS Choice Patient states their goals for this hospitalization and ongoing recovery are:: Return to snf          Discharge Placement   Existing PASRR number confirmed : 05/24/24          Patient [facility] at[facility] Patient to be transferred to facility by: [facility] Name of family member notified: Sister Patient and family notified of of transfer: 05/24/24  Discharge Plan and Services Additional resources added to the After Visit Summary for   In-house Referral: Clinical Social Work   Post Acute Care Choice: Nursing Home                               Social Drivers of Health (SDOH) Interventions SDOH Screenings   Food Insecurity: No Food Insecurity (05/20/2024)  Housing: Low Risk (05/20/2024)  Transportation Needs: No Transportation Needs (05/20/2024)  Utilities: Not At Risk (05/20/2024)  Depression (PHQ2-9): Low Risk (03/16/2022)  Social Connections: Patient Declined (05/20/2024)  Tobacco Use: Low Risk (05/19/2024)     Readmission Risk Interventions     05/21/2024   12:28 PM  Readmission Risk Prevention Plan  Transportation Screening Complete  Medication Review (RN Care Manager) Complete  PCP or Specialist appointment within 3-5 days of discharge Complete  HRI or Home Care Consult Complete  SW Recovery Care/Counseling Consult Complete  Palliative Care Screening Not Applicable  Skilled Nursing Facility Complete

## 2024-05-24 NOTE — Discharge Summary (Signed)
 " Physician Discharge Summary   Patient: [name] MRN: [id number] DOB: [birth year]  Admit date:     05/19/2024  Discharge date: 05/24/24  Discharge Physician: Yetta Blanch  PCP: [name], DO  Disposition: SNF long term Recommendations at discharge:  Follow up with PCP and repeat BMP in 1 week   Follow-up Information     [name], DO. Schedule an appointment as soon as possible for a visit in 1 week(s).   Why: with BMP lab to look at kidney/electrolyte numbers Contact information: [address] [place] [address] [phone]               Hospital Course: Patient is a 64 y.o.  female with history of CVA-left-sided residual hemiparesis, non-Hodgkin's lymphoma, HTN, HLD, DM-2, peripheral neuropathy-presented to the ED from SNF-with hypoglycemia-found to have hypothermic-suspected to have aspiration pneumonia.   Significant events: 1/18>> admit to [facility].   Significant studies: 1/18>> CXR: Worsening bibasilar atelectasis/left lower lung opacity 1/18>> CT head: No acute intracranial abnormality   Significant microbiology data: 1/18>> COVID/influenza/RSV PCR: Negative 1/18>> blood culture: No growth   Consults: None   Assessment and Plan: Severe sepsis secondary to aspiration pneumonia Sepsis physiology has improved Cultures are negative so far Treated with 5 days of IV antibiotic.   Dysphagia. Patient has known history of dysphagia.  Was on mechanical soft diet which was advanced to regular diet at the facility. Now presents with aspiration pneumonia. SLP following. Underwent MBS and was currently recommended to have dysphagia 2 diet nectar thick. Appreciate SLP evaluation.   Acute metabolic encephalopathy Secondary to hypoglycemia/sepsis physiology Resolved CT head negative Continue to treat underlying etiologies.   Hypernatremia Encourage oral intake Resolved with IV hydration with D5.  Sodium level stayed same after stopping d5.  Will hold hydrochlorothiazide     AKI Likely hemodynamically mediated Resolved   Type 2 diabetes mellitus with hypoglycemia.  With long-term insulin  use. A1c 7.6 on November 2025 with neuropathy. Hypoglycemic on initial presentation Probably secondary to poor oral intake-continued insulin  use As well with supportive care Recheck A1c May need adjustment of insulin  regimen on discharge. Continue SSI for now.   History of CVA with left hemiparesis No new focal deficit. Continue aspirin /statin/Zetia    Peripheral neuropathy Has had neuropathic pain for at least a year since her CVA On Neurontin .   HTN BP on the higher side Continue Coreg /transdermal clonidine /amlodipine -follow and optimize   History of hypothyroidism Recent TSH/free T4 (on 12/30) stable-no longer on methimazole  (per pharmacy-methimazole  last filled October/November)    GERD PPI   Mood disorder Resume Prozac /BuSpar -ordered encephalopathy has resolved   Small lymphocytic lymphoma Follows with oncology-Dr Onesimo Hold venetoclax  for now   Heterogeneous enlarged right thyroid  gland Incidental finding on CT chest-stable for outpatient follow-up with PCP for nonemergent thyroid  ultrasound.   Chronic debility/deconditioning Unclear functional status-poor historian-appears to have significant left-sided hemiplegia from prior CVA. SNF resident   Family Communication: Plan was discussed with Family Consultants:  none  Procedures performed:  none  DISCHARGE MEDICATION: Allergies as of 05/24/2024   No Known Allergies      Medication List     STOP taking these medications    hydrochlorothiazide  12.5 MG tablet Commonly known as: HYDRODIURIL        TAKE these medications    acetaminophen  325 MG tablet Commonly known as: TYLENOL  Take 650 mg by mouth every 4 (four) hours as needed for mild pain (pain score 1-3) or fever. Do not exceed 3000mg  in 24 hours  amLODipine  10 MG tablet Commonly known as:  NORVASC  Take 1 tablet by mouth once daily   ammonium lactate 12 % lotion Commonly known as: LAC-HYDRIN Apply 1 Application topically daily. Apply to bilateral feet and lower legs topically every day shift for dry skin   Aspercreme Lidocaine  4 % Crea Generic drug: Lidocaine  HCl Apply 1 Application topically daily as needed (bilateral feet pain).   aspirin  EC 81 MG tablet Take 1 tablet (81 mg total) by mouth daily. Swallow whole.   atorvastatin  80 MG tablet Commonly known as: Lipitor  Take 1 tablet (80 mg total) by mouth daily.   Baclofen  5 MG Tabs Take 4 tablets (20 mg total) by mouth 3 (three) times daily as needed for muscle spasms. What changed: when to take this   busPIRone  10 MG tablet Commonly known as: BUSPAR  Take 10 mg by mouth 2 (two) times daily.   carvedilol  12.5 MG tablet Commonly known as: COREG  Take 1 tablet (12.5 mg total) by mouth 2 (two) times daily with a meal.   cloNIDine  0.1 mg/24hr patch Commonly known as: CATAPRES  - Dosed in mg/24 hr Place 0.1 mg onto the skin every Saturday.   ezetimibe  10 MG tablet Commonly known as: ZETIA  Take 1 tablet (10 mg total) by mouth daily.   ferrous sulfate 325 (65 FE) MG tablet Take 325 mg by mouth daily at 12 noon.   FLUoxetine  40 MG capsule Commonly known as: PROZAC  Take 40 mg by mouth daily.   gabapentin  100 MG capsule Commonly known as: NEURONTIN  Take 2 capsules (200 mg total) by mouth 3 (three) times daily.   guaiFENesin  100 MG/5ML liquid Commonly known as: ROBITUSSIN Take 10 mLs by mouth every 4 (four) hours as needed for cough or to loosen phlegm.   insulin  aspart 100 UNIT/ML injection Commonly known as: novoLOG  Inject 0-10 Units into the skin 3 (three) times daily before meals. Before each meal 3 times a day, 140-199 - 4 units, 200-250 - 6 units, 251-299 - 8 units,  300-349 - 10 units,  350 or above 12 units. What changed: how much to take   melatonin 5 MG Tabs Take 5 mg by mouth at bedtime as needed  (insomnia).   metFORMIN  500 MG tablet Commonly known as: GLUCOPHAGE  Take 500 mg by mouth 2 (two) times daily with a meal.   Orajel 2X Toothache & Gum 20-0.26 % Gel Generic drug: Benzocaine-Menthol  Use as directed 1 Application in the mouth or throat every 4 (four) hours as needed.   oxyCODONE  5 MG immediate release tablet Commonly known as: Oxy IR/ROXICODONE  Take 1 tablet (5 mg total) by mouth every 8 (eight) hours as needed for moderate pain (pain score 4-6).   pantoprazole  40 MG tablet Commonly known as: PROTONIX  Take 1 tablet (40 mg total) by mouth daily.   polyethylene glycol 17 g packet Commonly known as: MIRALAX  / GLYCOLAX  Take 17 g by mouth daily.   senna-docusate 8.6-50 MG tablet Commonly known as: Senokot-S Take 1 tablet by mouth daily as needed for mild constipation.   SM Cranberry 300 MG tablet Generic drug: Cranberry Take 300 mg by mouth 2 (two) times daily.   Venclexta  100 MG tablet Generic drug: venetoclax  Take 2 tablets (200 mg total) by mouth daily. Tablets should be swallowed whole with a meal and a full glass of water.        Diet recommendation: Dysphagia type 2 nector thick Liquid  Discharge Exam: Vitals:   05/23/24 2033 05/24/24 0000 05/24/24 0400 05/24/24  0700  BP: (!) 146/90 (!) 144/69 (!) 152/82 (!) 147/75  Pulse: 74 80 81 88  Resp: 20 (!) 25 17 (!) 24  Temp: 97.6 F (36.4 C) 98.2 F (36.8 C) 98.1 F (36.7 C) 98 F (36.7 C)  TempSrc: Axillary Axillary Axillary Axillary  SpO2:    98%  Weight:      Height:        Filed Weights   05/19/24 1558  Weight: 69 kg   General: in Mild distress, No Rash Cardiovascular: S1 and S2 Present, No Murmur Respiratory: Good respiratory effort, Bilateral Air entry present. No Crackles, No wheezes Abdomen: Bowel Sound present, No tenderness Extremities: No edema Neuro: Alert and oriented x3, no new focal deficit chronic left weakness  Condition at discharge: stable  The results of significant  diagnostics from this hospitalization (including imaging, microbiology, ancillary and laboratory) are listed below for reference.   Imaging Studies: DG Swallowing Func-Speech Pathology Result Date: 05/21/2024 Table formatting from the original result was not included. Modified Barium Swallow Study Patient Details Name: Jasara Corrigan MRN: [id number] Date of Birth: April 25, 1961 Today's Date: 05/21/2024 HPI/PMH: HPI: Terre Hanneman is a 64 y.o. female who presented 1/18 by EMS from [facility] for respiratory distress.  Apparently her blood sugar at facility noted to be 50.  2 oral glucose packs were administered however per EMS report patient aspirated and developed respiratory distress that was treated with albuterol  at facility.  EMS was called and upon EMS arrival patient's oxygen saturation noted to be in 70s.  She was placed on nonrebreather mask and brought to the emergency department for further evaluation and management. Pt known to this service from prior admission. Most recent MBS 10/14/22 with recommendations for mechanical soft diet with thin with use of chin tuck. Pt with medical history significant of stroke with left-sided residual weakness, hypertension, hyperlipidemia, type 2 diabetes, non-Hodgkin lymphoma. Clinical Impression: SLP recommending PO diet of Dys 1 (puree solids), nectar thick liquids. Patient presents with a moderate oropharyngeal dysphagia as per this MBS. Silent aspiration (PAS 8) as well as penetration to the vocal cords that did not clear (PAS 5) occured with thin liquids. Swallow initiation delayed at level of posterior laryngeal surface of the epiglottis and pyriform sinus with thin liquids. No significant amount of barium residuals observed with any of the tested consistencies. She required frequent cues and assistance due to her inability to maintain adequate positioning of head. (at rest, head down in an almost chin tuck position). Factors that may increase risk of adverse  event in presence of aspiration Noe & Lianne 2021): Dependence for feeding and/or oral hygiene;Limited mobility;Respiratory or GI disease DIGEST Swallow Severity Rating*  Safety: 2  Efficiency:0  Overall Pharyngeal Swallow Severity: 2 1: mild; 2: moderate; 3: severe; 4: profound *The Dynamic Imaging Grade of Swallowing Toxicity is standardized for the head and neck cancer population, however, demonstrates promising clinical applications across populations to standardize the clinical rating of pharyngeal swallow safety and severity. Recommendations/Plan: Swallowing Evaluation Recommendations Swallowing Evaluation Recommendations Recommendations: PO diet PO Diet Recommendation: Dysphagia 1 (Pureed); Mildly thick liquids (Level 2, nectar thick) Liquid Administration via: Cup Medication Administration: Crushed with puree Supervision: Full supervision/cueing for swallowing strategies; Full assist for feeding Swallowing strategies  : Slow rate; Small bites/sips Postural changes: Position pt fully upright for meals Oral care recommendations: Oral care BID (2x/day); Staff/trained caregiver to provide oral care Caregiver Recommendations: Avoid jello, ice cream, thin soups, popsicles; Remove water pitcher; Have oral suction available Treatment  Plan Treatment Plan Treatment recommendations: Therapy as outlined in treatment plan below Follow-up recommendations: Skilled nursing-short term rehab (<3 hours/day) Functional status assessment: Patient has had a recent decline in their functional status and demonstrates the ability to make significant improvements in function in a reasonable and predictable amount of time. Treatment frequency: Min 2x/week Treatment duration: 1 week Interventions: Aspiration precaution training; Diet toleration management by SLP; Patient/family education; Trials of upgraded texture/liquids Recommendations Recommendations for follow up therapy are one component of a multi-disciplinary discharge  planning process, led by the attending physician.  Recommendations may be updated based on patient status, additional functional criteria and insurance authorization. Assessment: Orofacial Exam: Orofacial Exam Oral Cavity: Oral Hygiene: WFL Oral Cavity - Dentition: Adequate natural dentition; Missing dentition Orofacial Anatomy: WFL Anatomy: Anatomy: Prominent cricopharyngeus Boluses Administered: Boluses Administered Boluses Administered: Thin liquids (Level 0); Mildly thick liquids (Level 2, nectar thick); Moderately thick liquids (Level 3, honey thick); Puree  Oral Impairment Domain: Oral Impairment Domain Lip Closure: Interlabial escape, no progression to anterior lip Tongue control during bolus hold: Not tested Bolus transport/lingual motion: Brisk tongue motion Oral residue: Trace residue lining oral structures Location of oral residue : Tongue Initiation of pharyngeal swallow : Posterior laryngeal surface of the epiglottis; Pyriform sinuses  Pharyngeal Impairment Domain: Pharyngeal Impairment Domain Soft palate elevation: No bolus between soft palate (SP)/pharyngeal wall (PW) Laryngeal elevation: Complete superior movement of thyroid  cartilage with complete approximation of arytenoids to epiglottic petiole Anterior hyoid excursion: Partial anterior movement Epiglottic movement: Complete inversion Laryngeal vestibule closure: Incomplete, narrow column air/contrast in laryngeal vestibule Pharyngeal contraction (A/P view only): N/A Pharyngoesophageal segment opening: Complete distension and complete duration, no obstruction of flow Tongue base retraction: No contrast between tongue base and posterior pharyngeal wall (PPW) Pharyngeal residue: Trace residue within or on pharyngeal structures Location of pharyngeal residue: Tongue base  Esophageal Impairment Domain: Esophageal Impairment Domain Esophageal clearance upright position: Complete clearance, esophageal coating Pill: No data recorded  Penetration/Aspiration Scale Score: Penetration/Aspiration Scale Score 1.  Material does not enter airway: Puree; Moderately thick liquids (Level 3, honey thick); Mildly thick liquids (Level 2, nectar thick) 5.  Material enters airway, CONTACTS cords and not ejected out: Thin liquids (Level 0) 8.  Material enters airway, passes BELOW cords without attempt by patient to eject out (silent aspiration) : Thin liquids (Level 0) Compensatory Strategies: Compensatory Strategies Compensatory strategies: Yes Straw: Ineffective Ineffective Straw: Thin liquid (Level 0)   General Information: No data recorded Diet Prior to this Study: NPO   Temperature : Normal   Respiratory Status: WFL   Supplemental O2: None (Room air) ([place])   No data recorded Behavior/Cognition: Alert; Cooperative; Requires cueing Self-Feeding Abilities: Dependent for feeding Baseline vocal quality/speech: Not observed Volitional Cough: Unable to elicit Volitional Swallow: Unable to elicit Exam Limitations: Poor positioning Goal Planning: Prognosis for improved oropharyngeal function: Good No data recorded No data recorded Patient/Family Stated Goal: resume POs Consulted and agree with results and recommendations: Pt unable/family or caregiver not available Pain: Pain Assessment Pain Assessment: Faces Faces Pain Scale: 0 Breathing: 0 Negative Vocalization: 0 Facial Expression: 0 Body Language: 0 Consolability: 0 PAINAD Score: 0 End of Session: Start Time:SLP Start Time (ACUTE ONLY): 1318 Stop Time: SLP Stop Time (ACUTE ONLY): 1335 Time Calculation:SLP Time Calculation (min) (ACUTE ONLY): 17 min Charges: SLP Evaluations $ SLP Speech Visit: 1 Visit SLP Evaluations $BSS Swallow: 1 Procedure $MBS Swallow: 1 Procedure $Swallowing Treatment: 1 Procedure SLP visit diagnosis: SLP Visit Diagnosis: Dysphagia, oropharyngeal phase (R13.12) Past  Medical History: Past Medical History: Diagnosis Date  Abnormality of gait 05/10/2010  BACK PAIN 11/14/2008  Class 1 obesity due  to excess calories with body mass index (BMI) of 31.0 to 31.9 in adult 02/07/2021  DIABETES MELLITUS, TYPE II 07/15/2008  Diplopia 07/15/2008  ECZEMA, ATOPIC 04/03/2009  GERD (gastroesophageal reflux disease)   Guillain-Barre 1988  HYPERLIPIDEMIA 03/06/2009  HYPERTENSION 07/15/2008  Stroke (HCC) 2010, 2011  x2   Vertebral artery stenosis  Past Surgical History: Past Surgical History: Procedure Laterality Date  ABDOMINAL HYSTERECTOMY    BIOPSY  07/08/2022  Procedure: BIOPSY;  Surgeon: Leigh Elspeth SQUIBB, MD;  Location[facility] ENDOSCOPY;  Service: Gastroenterology;;  BIOPSY  10/12/2022  Procedure: BIOPSY;  Surgeon: San Sandor GAILS, DO;  Location: [facility] ENDOSCOPY;  Service: Gastroenterology;;  COLONOSCOPY WITH PROPOFOL  N/A 07/08/2022  Procedure: COLONOSCOPY WITH PROPOFOL ;  Surgeon: Leigh Elspeth SQUIBB, MD;  Location: [facility] ENDOSCOPY;  Service: Gastroenterology;  Laterality: N/A;  DILATION AND CURETTAGE OF UTERUS    FLEXIBLE SIGMOIDOSCOPY N/A 10/12/2022  Procedure: FLEXIBLE SIGMOIDOSCOPY;  Surgeon: San Sandor GAILS, DO;  Location: [facility] ENDOSCOPY;  Service: Gastroenterology;  Laterality: N/A;  FOOT SURGERY    IR ANGIO INTRA EXTRACRAN SEL COM CAROTID INNOMINATE UNI L MOD SED  09/17/2021  IR ANGIO INTRA EXTRACRAN SEL INTERNAL CAROTID UNI R MOD SED  09/17/2021  IR ANGIO VERTEBRAL SEL VERTEBRAL UNI R MOD SED  09/17/2021  IR US  GUIDE VASC ACCESS RIGHT  09/17/2021 Norleen IVAR Blase, MA, CCC-SLP Speech Therapy 05/21/2024, 3:59 PM  CT Head Wo Contrast Result Date: 05/19/2024 EXAM: CT HEAD WITHOUT CONTRAST 05/19/2024 04:56:11 PM TECHNIQUE: CT of the head was performed without the administration of intravenous contrast. Automated exposure control, iterative reconstruction, and/or weight based adjustment of the mA/kV was utilized to reduce the radiation dose to as low as reasonably achievable. COMPARISON: CT head without contrast 04/29/2024 and MR head without contrast 04/30/2024. CLINICAL HISTORY: Mental status change, unknown cause.  FINDINGS: BRAIN AND VENTRICLES: No acute hemorrhage. No evidence of acute infarct. 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 No hydrocephalus. No extra-axial collection. No mass effect or midline shift. Atherosclerotic calcifications within the cavernous internal carotid arteries. ORBITS: No acute abnormality. SINUSES: No acute abnormality. SOFT TISSUES AND SKULL: No acute soft tissue abnormality. No skull fracture. IMPRESSION: 1. No acute intracranial abnormality. 2. Stable remote lacunar infarcts of the cerebellum. Electronically signed by: Lonni Necessary MD 05/19/2024 05:21 PM EST RP Workstation: [id number]   DG Chest Portable 1 View Result Date: 05/19/2024 EXAM: 1 VIEW(S) XRAY OF THE CHEST 05/19/2024 04:18:00 PM COMPARISON: 04/30/2024 CLINICAL HISTORY: Hypoxia Hypoxia Hypoxia Hypoxia Hypoxia FINDINGS: LINES, TUBES AND DEVICES: Numerous leads and wires over chest. LUNGS AND PLEURA: Low lung volumes, accentuating pulmonary interstitium. Left lower lung opacity favored to be related to scarring. Slightly worsened bibasilar atelectasis. No pleural effusion. No pneumothorax. HEART AND MEDIASTINUM: Mild cardiomegaly. Atherosclerosis in transverse aorta. BONES AND SOFT TISSUES: No acute osseous abnormality. IMPRESSION: 1. Low lung volumes with slightly worsened bibasilar atelectasis and left lower lung opacity favored to be related to scarring. 2. Mild cardiomegaly. Electronically signed by: Norleen Boxer MD 05/19/2024 05:08 PM EST RP Workstation: [id number]   VAS US  ABI WITH/WO TBI Result Date: 05/04/2024  LOWER EXTREMITY DOPPLER STUDY Patient Name:  Miyo Aina  Date of Exam:   05/01/2024 Medical Rec #: [id number]        Accession #:    [id number] Date of Birth: 1961/03/14        Patient Gender: F Patient Age:  63 years Exam Location:  [facility] Procedure:      VAS US  ABI WITH/WO TBI Referring Phys: DONALDA APPLEBAUM  --------------------------------------------------------------------------------  Indications: Peripheral artery disease. High Risk Factors: Hypertension, hyperlipidemia, Diabetes, no history of                    smoking, prior CVA. Other Factors: CKD.  Comparison Study: Previous exam was on 04/08/2022 Performing Technologist: Ezzie Potters RVT, RDMS  Examination Guidelines: A complete evaluation includes at minimum, Doppler waveform signals and systolic blood pressure reading at the level of bilateral brachial, anterior tibial, and posterior tibial arteries, when vessel segments are accessible. Bilateral testing is considered an integral part of a complete examination. Photoelectric Plethysmograph (PPG) waveforms and toe systolic pressure readings are included as required and additional duplex testing as needed. Limited examinations for reoccurring indications may be performed as noted.  ABI Findings: +---------+------------------+-----+---------+--------+ Right    Rt Pressure (mmHg)IndexWaveform Comment  +---------+------------------+-----+---------+--------+ Brachial 167                    triphasic         +---------+------------------+-----+---------+--------+ PTA      162               0.97 biphasic          +---------+------------------+-----+---------+--------+ DP       149               0.89 biphasic          +---------+------------------+-----+---------+--------+ Great Toe136               0.81 Normal            +---------+------------------+-----+---------+--------+ +---------+------------------+-----+----------+----------------------+ Left     Lt Pressure (mmHg)IndexWaveform  Comment                +---------+------------------+-----+----------+----------------------+ Brachial                                  contracted (prior CVA) +---------+------------------+-----+----------+----------------------+ PTA      126               0.75 monophasic                        +---------+------------------+-----+----------+----------------------+ DP       120               0.72 monophasic                       +---------+------------------+-----+----------+----------------------+ Great Toe118               0.71 Abnormal                         +---------+------------------+-----+----------+----------------------+ +-------+-----------+-----------+------------+------------+ ABI/TBIToday's ABIToday's TBIPrevious ABIPrevious TBI +-------+-----------+-----------+------------+------------+ Right  0.97       0.80       0.94        0.80         +-------+-----------+-----------+------------+------------+ Left   0.75       0.71       0.64        0.61         +-------+-----------+-----------+------------+------------+   Summary: Right: Resting right ankle-brachial index is within normal range. The right toe-brachial index is normal.  Left: Resting left ankle-brachial index indicates moderate left lower  extremity arterial disease. The left toe-brachial index is normal, however waveform is dampened.  *See table(s) above for measurements and observations.  Electronically signed by Penne Colorado MD on 05/04/2024 at 10:38:49 AM.    Final    VAS US  LOWER EXTREMITY VENOUS (DVT) Result Date: 05/01/2024  Lower Venous DVT Study Patient Name:  NEEYA PRIGMORE  Date of Exam:   05/01/2024 Medical Rec #: [id number]        Accession #:    [id number] Date of Birth: February 15, 1961        Patient Gender: F Patient Age:   16 years Exam Location:  [facility] Procedure:      VAS US  LOWER EXTREMITY VENOUS (DVT) Referring Phys: EKTA Majel Giel --------------------------------------------------------------------------------  Indications: History of CVA with residual left sided weakness/hemiparesis and dysarthria, Swelling, and Pain.  Risk Factors: DM, HTN, HLD. Comparison Study: No priors. Performing Technologist: Ricka Sturdivant-Jones RDMS, RVT  Examination Guidelines: A complete  evaluation includes B-mode imaging, spectral Doppler, color Doppler, and power Doppler as needed of all accessible portions of each vessel. Bilateral testing is considered an integral part of a complete examination. Limited examinations for reoccurring indications may be performed as noted. The reflux portion of the exam is performed with the patient in reverse Trendelenburg.  +---------+---------------+---------+-----------+----------+-------------------+ RIGHT    CompressibilityPhasicitySpontaneityPropertiesThrombus Aging      +---------+---------------+---------+-----------+----------+-------------------+ CFV      Full           Yes      Yes                                      +---------+---------------+---------+-----------+----------+-------------------+ SFJ      Full                                                             +---------+---------------+---------+-----------+----------+-------------------+ FV Prox  Full                                                             +---------+---------------+---------+-----------+----------+-------------------+ FV Mid   Full           Yes      Yes                                      +---------+---------------+---------+-----------+----------+-------------------+ FV DistalFull                                                             +---------+---------------+---------+-----------+----------+-------------------+ PFV      Full                                                             +---------+---------------+---------+-----------+----------+-------------------+  POP      Full           Yes      Yes                                      +---------+---------------+---------+-----------+----------+-------------------+ PTV      Full                                                             +---------+---------------+---------+-----------+----------+-------------------+ PERO                                                   not well visualized +---------+---------------+---------+-----------+----------+-------------------+   +---------+---------------+---------+-----------+----------+--------------+ LEFT     CompressibilityPhasicitySpontaneityPropertiesThrombus Aging +---------+---------------+---------+-----------+----------+--------------+ CFV      Full           Yes      Yes                                 +---------+---------------+---------+-----------+----------+--------------+ SFJ      Full                                                        +---------+---------------+---------+-----------+----------+--------------+ FV Prox  Full                                                        +---------+---------------+---------+-----------+----------+--------------+ FV Mid   Full           Yes      Yes                                 +---------+---------------+---------+-----------+----------+--------------+ FV DistalFull                                                        +---------+---------------+---------+-----------+----------+--------------+ PFV      Full                                                        +---------+---------------+---------+-----------+----------+--------------+ POP      Full           Yes      Yes                                 +---------+---------------+---------+-----------+----------+--------------+  PTV      Full                                                        +---------+---------------+---------+-----------+----------+--------------+ PERO     Full                                                        +---------+---------------+---------+-----------+----------+--------------+     Summary: RIGHT: - There is no evidence of deep vein thrombosis in the lower extremity. However, portions of this examination were limited- see technologist comments above.  - No cystic structure found in the  popliteal fossa.  LEFT: - There is no evidence of deep vein thrombosis in the lower extremity.  - No cystic structure found in the popliteal fossa.  *See table(s) above for measurements and observations. Electronically signed by Penne Colorado MD on 05/01/2024 at 5:02:20 PM.    Final    ECHOCARDIOGRAM COMPLETE BUBBLE STUDY Result Date: 05/01/2024    ECHOCARDIOGRAM REPORT   Patient Name:   CARLETHA DAWN Date of Exam: 05/01/2024 Medical Rec #:  [id number]       Height:       64.0 in Accession #:    [id number]      Weight:       150.1 lb Date of Birth:  01/17/1961       BSA:          1.732 m Patient Age:    63 years        BP:           142/92 mmHg Patient Gender: F               HR:           108 bpm. Exam Location:  Inpatient Procedure: 2D Echo, Cardiac Doppler, Color Doppler, Intracardiac Opacification            Agent and Saline Contrast Bubble Study (Both Spectral and Color Flow            Doppler were utilized during procedure). Indications:    Stroke  History:        Patient has prior history of Echocardiogram examinations, most                 recent 05/31/2022. Risk Factors:Hypertension, Diabetes and                 Dyslipidemia.  Sonographer:    Sherlean Dubin Referring Phys: [id number] EKTA V Kharee Lesesne  Sonographer Comments: Suboptimal apical window. Image acquisition challenging due to patient body habitus and Image acquisition challenging due to respiratory motion. IMPRESSIONS  1. Technically difficult; saline microcavitation study not interpretable.  2. Left ventricular ejection fraction, by estimation, is >75%. The left ventricle has hyperdynamic function. The left ventricle has no regional wall motion abnormalities. There is moderate left ventricular hypertrophy. Left ventricular diastolic parameters are consistent with Grade I diastolic dysfunction (impaired relaxation).  3. Right ventricular systolic function is normal. The right ventricular size is normal.  4. The mitral valve is normal in structure. No  evidence of mitral valve regurgitation. No evidence of mitral stenosis.  5. The  aortic valve is tricuspid. Aortic valve regurgitation is not visualized. No aortic stenosis is present.  6. The inferior vena cava is normal in size with greater than 50% respiratory variability, suggesting right atrial pressure of 3 mmHg. FINDINGS  Left Ventricle: Left ventricular ejection fraction, by estimation, is >75%. The left ventricle has hyperdynamic function. The left ventricle has no regional wall motion abnormalities. Definity  contrast agent was given IV to delineate the left ventricular endocardial borders. The left ventricular internal cavity size was normal in size. There is moderate left ventricular hypertrophy. Left ventricular diastolic parameters are consistent with Grade I diastolic dysfunction (impaired relaxation). Right Ventricle: The right ventricular size is normal. Right ventricular systolic function is normal. Left Atrium: Left atrial size was normal in size. Right Atrium: Right atrial size was normal in size. Pericardium: Trivial pericardial effusion is present. Mitral Valve: The mitral valve is normal in structure. No evidence of mitral valve regurgitation. No evidence of mitral valve stenosis. Tricuspid Valve: The tricuspid valve is normal in structure. Tricuspid valve regurgitation is trivial. No evidence of tricuspid stenosis. Aortic Valve: The aortic valve is tricuspid. Aortic valve regurgitation is not visualized. No aortic stenosis is present. Aortic valve peak gradient measures 6.4 mmHg. Pulmonic Valve: The pulmonic valve was not well visualized. Pulmonic valve regurgitation is not visualized. No evidence of pulmonic stenosis. Aorta: The aortic root is normal in size and structure. Venous: The inferior vena cava is normal in size with greater than 50% respiratory variability, suggesting right atrial pressure of 3 mmHg. IAS/Shunts: The interatrial septum was not well visualized. Additional Comments:  Technically difficult; saline microcavitation study not interpretable.  LEFT VENTRICLE PLAX 2D LVIDd:         3.50 cm   Diastology LVIDs:         2.30 cm   LV e' medial:    6.42 cm/s LV PW:         1.00 cm   LV E/e' medial:  8.6 LV IVS:        1.20 cm   LV e' lateral:   6.96 cm/s LVOT diam:     2.00 cm   LV E/e' lateral: 7.9 LV SV:         47 LV SV Index:   27 LVOT Area:     3.14 cm  RIGHT VENTRICLE             IVC RV S prime:     22.40 cm/s  IVC diam: 1.50 cm TAPSE (M-mode): 1.3 cm LEFT ATRIUM             Index        RIGHT ATRIUM           Index LA diam:        3.00 cm 1.73 cm/m   RA Area:     13.00 cm LA Vol (A2C):   36.9 ml 21.31 ml/m  RA Volume:   32.10 ml  18.53 ml/m LA Vol (A4C):   31.5 ml 18.19 ml/m LA Biplane Vol: 35.0 ml 20.21 ml/m  AORTIC VALVE AV Area (Vmax): 2.44 cm AV Vmax:        126.00 cm/s AV Peak Grad:   6.4 mmHg LVOT Vmax:      97.70 cm/s LVOT Vmean:     63.200 cm/s LVOT VTI:       0.150 m  AORTA Ao Root diam: 2.70 cm Ao Asc diam:  3.00 cm MITRAL VALVE  TRICUSPID VALVE MV Area (PHT): 6.32 cm    TR Peak grad:   6.0 mmHg MV Decel Time: 120 msec    TR Vmax:        122.00 cm/s MV E velocity: 55.30 cm/s MV A velocity: 89.70 cm/s  SHUNTS MV E/A ratio:  0.62        Systemic VTI:  0.15 m                            Systemic Diam: 2.00 cm Redell Shallow MD Electronically signed by Redell Shallow MD Signature Date/Time: 05/01/2024/10:04:54 AM    Final    MR BRAIN WO CONTRAST Result Date: 04/30/2024 EXAM: MRI BRAIN WITHOUT CONTRAST 04/30/2024 07:01:38 PM TECHNIQUE: Multiplanar multisequence MRI of the head/brain was performed without the administration of intravenous contrast. COMPARISON: CT head 04/29/2024 and MRI head 03/01/2024. CLINICAL HISTORY: Mental status change, unknown cause. FINDINGS: BRAIN AND VENTRICLES: No acute infarct. No intracranial hemorrhage. No mass. No midline shift. No hydrocephalus. Scattered T2 and FLAIR hyperintensity in the periventricular and subcortical  white matter compatible with mild chronic microvascular ischemic changes. Similar appearance of chronic encephalomalacia in the ventral medulla. Bilateral cerebellar remote infarcts. Similar small remote infarct along the right paramedian dorsal pons. Similar loss of the flow voids within the V4 segments of the vertebral arteries, right greater than left, as well as within the proximal basilar artery, similar to prior. Preserved flow void within the distal basilar artery, similar to prior. The sella is unremarkable. ORBITS: No acute abnormality. SINUSES AND MASTOIDS: Mucosal thickening in the ethmoid sinuses. Trace fluid in the left mastoid air cells. BONES AND SOFT TISSUES: Normal marrow signal. No acute soft tissue abnormality. IMPRESSION: 1. No acute intracranial abnormality. 2. Similar loss of flow voids within the V4 segments of the vertebral arteries, right greater than left, and within the proximal basilar artery. 3. 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 Electronically signed by: Donnice Mania MD 04/30/2024 08:28 PM EST RP Workstation: [id number]   CT Angio Chest PE W and/or Wo Contrast Result Date: 04/30/2024 EXAM: CTA CHEST 04/30/2024 10:14:21 AM TECHNIQUE: CTA of the chest was performed without and with the administration of 75 mL of iohexol  (OMNIPAQUE ) 350 MG/ML injection. Multiplanar reformatted images are provided for review. MIP images are provided for review. Automated exposure control, iterative reconstruction, and/or weight based adjustment of the mA/kV was utilized to reduce the radiation dose to as low as reasonably achievable. COMPARISON: None available. CLINICAL HISTORY: Pulmonary embolism (PE) suspected, high prob. FINDINGS: PULMONARY ARTERIES: Pulmonary arteries are adequately opacified for evaluation. No acute pulmonary embolus. The main pulmonary artery measures at the upper limits of normal at 3.2 cm. The limited evaluation  of the subsegmental level is due to motion artifact. MEDIASTINUM: Aberrant right subclavian arteryis noted. The thoracic aorta is normal in caliber. The heart and pericardium demonstrate no acute abnormality other than cardiomegaly. LYMPH NODES: No mediastinal, hilar or axillary lymphadenopathy. LUNGS AND PLEURA: Bibasilar linear atelectasis versus scarring. Expiratory phase of respiration. Mosaic attenuation of the lungs. No focal consolidation or pulmonary edema. No evidence of pleural effusion or pneumothorax. UPPER ABDOMEN: Colonic diverticulosis. SOFT TISSUES AND BONES: Enlarged heterogeneous right thyroid  gland with underlying hyperdense and hypodense masses. No acute bone abnormality. Heterogenous appearance of the spine suggestive of osteopenia. IMPRESSION: 1. No pulmonary embolism. Limited evaluation at the subsegmental level due to motion artifact and adjacent lung disease. 2. No  acute findings in the setting of expiratory phase of respiration and linear scarring/atelectasis. 3. Prominent pulmonary artery size - correlate for pulmonary hypertension. 4. Cardiomegaly. 5. Enlarged heterogeneous right thyroid  gland with underlying masses; recommend non-emergent thyroid  ultrasound. Electronically signed by: Morgane Naveau MD 04/30/2024 11:58 AM EST RP Workstation: [id number]   DG Femur Portable Min 2 Views Left Result Date: 04/30/2024 EXAM: 2 VIEW(S) XRAY OF THE _LATERALITY_ FEMUR 04/30/2024 01:17:00 AM COMPARISON: None available. CLINICAL HISTORY: pain FINDINGS: BONES AND JOINTS: No acute fracture. No malalignment. SOFT TISSUES: Vascular calcifications. IMPRESSION: 1. No acute findings. Electronically signed by: Franky Stanford MD 04/30/2024 02:56 AM EST RP Workstation: [id number]   DG Chest Portable 1 View Result Date: 04/30/2024 EXAM: 1 VIEW(S) XRAY OF THE CHEST 04/30/2024 01:18:00 AM COMPARISON: 03/01/2024 CLINICAL HISTORY: pain FINDINGS: LUNGS AND PLEURA: Low lung volumes. Stable linear atelectasis or  scarring in left mid lung. No pleural effusion. No pneumothorax. HEART AND MEDIASTINUM: Aortic atherosclerosis. No acute abnormality of the cardiac silhouette. BONES AND SOFT TISSUES: No acute osseous abnormality. IMPRESSION: 1. Stable linear atelectasis or scarring in left mid lung. 2. Low lung volumes. Electronically signed by: Franky Stanford MD 04/30/2024 02:56 AM EST RP Workstation: [id number]   DG Femur Portable Min 2 Views Right Result Date: 04/30/2024 EXAM: 2 VIEW(S) XRAY OF THE FEMUR 04/30/2024 01:14:00 AM COMPARISON: None available. CLINICAL HISTORY: pain FINDINGS: BONES AND JOINTS: No acute fracture. No malalignment. SOFT TISSUES: Scattered femoral arterial calcifications. IMPRESSION: 1. No acute findings. Electronically signed by: Franky Stanford MD 04/30/2024 02:55 AM EST RP Workstation: [id number]   CT ABDOMEN PELVIS WO CONTRAST Result Date: 04/29/2024 EXAM: CT ABDOMEN AND PELVIS WITHOUT CONTRAST 04/29/2024 07:26:02 PM TECHNIQUE: CT of the abdomen and pelvis was performed without the administration of intravenous contrast. Multiplanar reformatted images are provided for review. Automated exposure control, iterative reconstruction, and/or weight-based adjustment of the mA/kV was utilized to reduce the radiation dose to as low as reasonably achievable. COMPARISON: Findings are compared to 10/04/2022. CLINICAL HISTORY: Abdominal pain, acute, nonlocalized. Chronic lymphocytic leukemia. Tracking code: Bo. FINDINGS: LOWER CHEST: No acute abnormality. LIVER: The liver is unremarkable. GALLBLADDER AND BILE DUCTS: Gallbladder is unremarkable. No biliary ductal dilatation. SPLEEN: No acute abnormality. PANCREAS: No acute abnormality. ADRENAL GLANDS: No acute abnormality. KIDNEYS, URETERS AND BLADDER: Right kidney: Simple cortical cyst noted within the lower pole of the right kidney for which no follow up imaging is recommended. Left kidney: Left lower pole exophytic low attenuation lesion was previously  characterized as a simple cyst and demonstrates interval decrease in size in keeping with an involuting cyst. Again no follow up imaging is recommended for this lesion. Per consensus, no follow-up is needed for simple Bosniak type 1 and 2 renal cysts, unless the patient has a malignancy history or risk factors. Mild nonspecific bilateral perinephric stranding is unchanged. No stones in the kidneys or ureters. No hydronephrosis. No periureteral stranding. Urinary bladder: Gas within the bladder lumen is nonspecific, possibly related to recent catheterization. Urosepsis. GI AND BOWEL: Stomach demonstrates no acute abnormality. Appendix normal. The small bowel and large bowel are otherwise unremarkable. There is no bowel obstruction. PERITONEUM AND RETROPERITONEUM: No ascites. No free air. VASCULATURE: Aorta is normal in caliber. No aortic aneurysm. Mild aortoiliac atherosclerotic calcification. LYMPH NODES: Previously noted pathologic retroperitoneal and pelvic adenopathy has resolved. No pathologic residual adenopathy is seen within the abdomen and pelvis. REPRODUCTIVE ORGANS: No adnexal masses. BONES AND SOFT TISSUES: The osseous structures are diffusely osteopenic, progressive since prior examination. Tiny fat-containing umbilical hernia.  IMPRESSION: 1. No acute findings in the abdomen or pelvis to explain abdominal pain. 2. Resolution of previously noted pathologic retroperitoneal and pelvic adenopathy, with no pathologic residual adenopathy in the abdomen or pelvis. 3. Gas within the bladder lumen, possibly related to recent catheterization. Correlation with clinical history is recommended. 4. Diffuse osteopenia, progressive since prior examination Electronically signed by: Dorethia Molt MD 04/29/2024 09:54 PM EST RP Workstation: [id number]   CT Head Wo Contrast Result Date: 04/29/2024 EXAM: CT HEAD WITHOUT CONTRAST 04/29/2024 07:26:02 PM TECHNIQUE: CT of the head was performed without the administration of  intravenous contrast. Automated exposure control, iterative reconstruction, and/or weight based adjustment of the mA/kV was utilized to reduce the radiation dose to as low as reasonably achievable. COMPARISON: 03/01/2024 CLINICAL HISTORY: Mental status change, unknown cause FINDINGS: BRAIN AND VENTRICLES: No acute hemorrhage. No evidence of acute infarct. No hydrocephalus. No extra-axial collection. No mass effect or midline shift. Moderate vascular calcifications within the carotid siphons. ORBITS: No acute abnormality. SINUSES: No acute abnormality. SOFT TISSUES AND SKULL: No acute soft tissue abnormality. No skull fracture. IMPRESSION: 1. No acute intracranial abnormality. Electronically signed by: Franky Stanford MD 04/29/2024 08:34 PM EST RP Workstation: [id number]    Microbiology: Results for orders placed or performed during the hospital encounter of 05/19/24  Resp panel by RT-PCR (RSV, Flu A&B, Covid) Anterior Nasal Swab     Status: None   Collection Time: 05/19/24  4:05 PM   Specimen: Anterior Nasal Swab  Result Value Ref Range Status   SARS Coronavirus 2 by RT PCR NEGATIVE NEGATIVE Final   Influenza A by PCR NEGATIVE NEGATIVE Final   Influenza B by PCR NEGATIVE NEGATIVE Final    Comment: (NOTE) The Xpert Xpress SARS-CoV-2/FLU/RSV plus assay is intended as an aid in the diagnosis of influenza from Nasopharyngeal swab specimens and should not be used as a sole basis for treatment. Nasal washings and aspirates are unacceptable for Xpert Xpress SARS-CoV-2/FLU/RSV testing.  Fact Sheet for Patients: bloggercourse.com  Fact Sheet for Healthcare Providers: seriousbroker.it  This test is not yet approved or cleared by the [place]  FDA and has been authorized for detection and/or diagnosis of SARS-CoV-2 by FDA under an Emergency Use Authorization (EUA). This EUA will remain in effect (meaning this test can be used) for the duration of  the COVID-19 declaration under Section 564(b)(1) of the Act, 21 U.S.C. section 360bbb-3(b)(1), unless the authorization is terminated or revoked.     Resp Syncytial Virus by PCR NEGATIVE NEGATIVE Final    Comment: (NOTE) Fact Sheet for Patients: bloggercourse.com  Fact Sheet for Healthcare Providers: seriousbroker.it  This test is not yet approved or cleared by the [place]  FDA and has been authorized for detection and/or diagnosis of SARS-CoV-2 by FDA under an Emergency Use Authorization (EUA). This EUA will remain in effect (meaning this test can be used) for the duration of the COVID-19 declaration under Section 564(b)(1) of the Act, 21 U.S.C. section 360bbb-3(b)(1), unless the authorization is terminated or revoked.  Performed at [facility], [address], [place], [place] [address]   Blood culture (routine x 2)     Status: None   Collection Time: 05/19/24  4:18 PM   Specimen: BLOOD RIGHT HAND  Result Value Ref Range Status   Specimen Description BLOOD RIGHT HAND  Final   Special Requests   Final    BOTTLES DRAWN AEROBIC AND ANAEROBIC Blood Culture results may not be optimal due to an inadequate volume of blood received  in culture bottles   Culture   Final    NO GROWTH 5 DAYS Performed at [facility], [address], [place], [place] [address]    Report Status 05/24/2024 FINAL  Final  Blood culture (routine x 2)     Status: None   Collection Time: 05/19/24  4:23 PM   Specimen: BLOOD  Result Value Ref Range Status   Specimen Description BLOOD LEFT ANTECUBITAL  Final   Special Requests   Final    BOTTLES DRAWN AEROBIC AND ANAEROBIC Blood Culture adequate volume   Culture   Final    NO GROWTH 5 DAYS Performed at [facility], [address] [place], [place] [address]    Report Status 05/24/2024 FINAL  Final   Labs: CBC: Recent Labs  Lab 05/19/24 1604 05/19/24 1609 05/19/24 1914  05/20/24 0312 05/21/24 0247 05/22/24 0325 05/23/24 0220  WBC 3.3*  --  5.8 5.7 5.2 5.4 5.0  NEUTROABS 2.3  --   --   --   --   --   --   HGB 9.9*   < > 9.0* 9.2* 9.7* 9.1* 9.9*  HCT 31.0*   < > 28.4* 28.6* 30.1* 28.2* 30.5*  MCV 86.6  --  86.1 85.6 83.8 84.2 82.4  PLT 248  --  208 206 207 207 212   < > = values in this interval not displayed.   Basic Metabolic Panel: Recent Labs  Lab 05/21/24 0247 05/22/24 0325 05/22/24 2008 05/23/24 0220 05/23/24 1131 05/23/24 1827 05/24/24 0553  NA 149*   < > 147* 145 144 144 142  K 3.7   < > 3.9 3.5 3.5 3.5 3.4*  CL 110   < > 108 106 106 105 106  CO2 27   < > 29 27 28 27 24   GLUCOSE 139*   < > 175* 170* 195* 184* 156*  BUN 34*   < > 13 12 10 11 10   CREATININE 0.83   < > 0.87 0.86 0.86 0.91 0.92  CALCIUM  10.6*   < > 10.1 10.1 9.8 9.9 10.0  MG 2.0  --   --  1.5*  --   --  2.5*  PHOS  --   --   --   --   --   --  3.2   < > = values in this interval not displayed.   Liver Function Tests: Recent Labs  Lab 05/19/24 1604 05/20/24 0312  AST 23 19  ALT 22 18  ALKPHOS 72 67  BILITOT 0.3 0.3  PROT 7.2 6.7  ALBUMIN 3.6 3.3*   CBG: Recent Labs  Lab 05/23/24 0749 05/23/24 1218 05/23/24 1616 05/23/24 2117 05/24/24 0753  GLUCAP 177* 164* 90 196* 156*    Discharge time spent: 35 minutes  Author: Yetta Blanch, MD  [facility]    "

## 2024-05-24 NOTE — Progress Notes (Signed)
 Ivs removed.  Report called to Astronomer at [facility].  Patient awaiting PTAR. 19th in line

## 2024-05-27 ENCOUNTER — Other Ambulatory Visit: Payer: Self-pay | Admitting: Hematology

## 2024-05-27 ENCOUNTER — Other Ambulatory Visit: Payer: Self-pay

## 2024-05-28 ENCOUNTER — Other Ambulatory Visit: Payer: Self-pay

## 2024-05-29 ENCOUNTER — Other Ambulatory Visit (HOSPITAL_COMMUNITY): Payer: Self-pay

## 2024-05-30 ENCOUNTER — Other Ambulatory Visit: Payer: Self-pay | Admitting: Pharmacy Technician

## 2024-05-30 ENCOUNTER — Other Ambulatory Visit: Payer: Self-pay

## 2024-05-30 MED ORDER — VENETOCLAX 100 MG PO TABS
200.0000 mg | ORAL_TABLET | Freq: Every day | ORAL | 0 refills | Status: AC
  Filled 2024-05-30 (×2): qty 56, 28d supply, fill #0

## 2024-06-12 ENCOUNTER — Inpatient Hospital Stay

## 2024-06-12 ENCOUNTER — Inpatient Hospital Stay: Admitting: Hematology

## 2024-07-03 ENCOUNTER — Inpatient Hospital Stay

## 2024-07-03 ENCOUNTER — Inpatient Hospital Stay: Admitting: Hematology

## 2024-07-24 ENCOUNTER — Ambulatory Visit: Admitting: Neurology
